# Patient Record
Sex: Female | Born: 1980 | Race: Black or African American | Hispanic: No | Smoking: Never smoker
Health system: Southern US, Community
[De-identification: ages and names within clinical notes are randomized; demographics above are authoritative.]

## PROBLEM LIST (undated history)

## (undated) DIAGNOSIS — I248 Other forms of acute ischemic heart disease: Secondary | ICD-10-CM

## (undated) DIAGNOSIS — I272 Pulmonary hypertension, unspecified: Secondary | ICD-10-CM

## (undated) DIAGNOSIS — E538 Deficiency of other specified B group vitamins: Secondary | ICD-10-CM

## (undated) DIAGNOSIS — D571 Sickle-cell disease without crisis: Secondary | ICD-10-CM

## (undated) DIAGNOSIS — Z5189 Encounter for other specified aftercare: Secondary | ICD-10-CM

## (undated) DIAGNOSIS — J189 Pneumonia, unspecified organism: Secondary | ICD-10-CM

## (undated) DIAGNOSIS — IMO0001 Reserved for inherently not codable concepts without codable children: Secondary | ICD-10-CM

## (undated) DIAGNOSIS — I517 Cardiomegaly: Secondary | ICD-10-CM

## (undated) DIAGNOSIS — F329 Major depressive disorder, single episode, unspecified: Secondary | ICD-10-CM

## (undated) HISTORY — PX: OTHER SURGICAL HISTORY: SHX169

## (undated) HISTORY — PX: PORTACATH PLACEMENT: SHX2246

## (undated) HISTORY — PX: CHOLECYSTECTOMY: SHX55

## (undated) SURGERY — ECHOCARDIOGRAM, TRANSESOPHAGEAL
Anesthesia: Moderate Sedation

---

## 2009-12-18 ENCOUNTER — Ambulatory Visit: Payer: Self-pay | Admitting: Cardiology

## 2010-08-10 ENCOUNTER — Emergency Department (HOSPITAL_COMMUNITY): Admission: EM | Admit: 2010-08-10 | Discharge: 2010-08-10 | Payer: Self-pay | Admitting: Emergency Medicine

## 2010-08-20 ENCOUNTER — Inpatient Hospital Stay (HOSPITAL_COMMUNITY)
Admission: EM | Admit: 2010-08-20 | Discharge: 2010-08-26 | Payer: Self-pay | Source: Home / Self Care | Admitting: Emergency Medicine

## 2010-11-09 ENCOUNTER — Inpatient Hospital Stay (HOSPITAL_COMMUNITY)
Admission: EM | Admit: 2010-11-09 | Discharge: 2010-11-14 | DRG: 812 | Disposition: A | Discharge status: Home / Self Care | Payer: Medicare Other | Attending: Internal Medicine | Admitting: Internal Medicine

## 2010-11-09 ENCOUNTER — Emergency Department (HOSPITAL_COMMUNITY): Payer: Medicare Other

## 2010-11-09 DIAGNOSIS — R0902 Hypoxemia: Secondary | ICD-10-CM | POA: Diagnosis present

## 2010-11-09 DIAGNOSIS — D5700 Hb-SS disease with crisis, unspecified: Principal | ICD-10-CM | POA: Diagnosis present

## 2010-11-09 DIAGNOSIS — D5701 Hb-SS disease with acute chest syndrome: Secondary | ICD-10-CM | POA: Diagnosis present

## 2010-11-09 DIAGNOSIS — J069 Acute upper respiratory infection, unspecified: Secondary | ICD-10-CM | POA: Diagnosis present

## 2010-11-09 LAB — DIFFERENTIAL
Basophils Absolute: 0 10*3/uL (ref 0.0–0.1)
Basophils Relative: 0 % (ref 0–1)
Eosinophils Absolute: 0 10*3/uL (ref 0.0–0.7)
Eosinophils Relative: 0 % (ref 0–5)
Lymphocytes Relative: 23 % (ref 12–46)
Lymphs Abs: 3.2 10*3/uL (ref 0.7–4.0)
Monocytes Absolute: 0 10*3/uL — ABNORMAL LOW (ref 0.1–1.0)
Monocytes Relative: 0 % — ABNORMAL LOW (ref 3–12)
Neutro Abs: 10.9 10*3/uL — ABNORMAL HIGH (ref 1.7–7.7)
Neutrophils Relative %: 75 % (ref 43–77)
nRBC: 0 /100 WBC

## 2010-11-09 LAB — BASIC METABOLIC PANEL
CO2: 24 mEq/L (ref 19–32)
Chloride: 105 mEq/L (ref 96–112)
GFR calc Af Amer: >60 mL/min (ref >60)
Potassium: 3.7 mEq/L (ref 3.5–5.1)

## 2010-11-09 LAB — CBC
Hemoglobin: 7.9 g/dL — ABNORMAL LOW (ref 12.0–15.0)
MCH: 35.4 pg — ABNORMAL HIGH (ref 26.0–34.0)
MCHC: 38.2 g/dL — ABNORMAL HIGH (ref 30.0–36.0)
RDW: 25.5 % — ABNORMAL HIGH (ref 11.5–15.5)

## 2010-11-09 LAB — RETICULOCYTES: Retic Ct Pct: 28.5 % — ABNORMAL HIGH (ref 0.4–3.1)

## 2010-11-10 LAB — COMPREHENSIVE METABOLIC PANEL
ALT: 25 U/L (ref 0–35)
AST: 60 U/L — ABNORMAL HIGH (ref 0–37)
CO2: 23 mEq/L (ref 19–32)
Calcium: 8.3 mg/dL — ABNORMAL LOW (ref 8.4–10.5)
GFR calc Af Amer: >60 mL/min (ref >60)
Potassium: 3.8 mEq/L (ref 3.5–5.1)
Sodium: 139 mEq/L (ref 135–145)
Total Protein: 6.6 g/dL (ref 6.0–8.3)

## 2010-11-10 LAB — CBC
HCT: 19.7 % — ABNORMAL LOW (ref 36.0–46.0)
MCH: 34.9 pg — ABNORMAL HIGH (ref 26.0–34.0)
MCHC: 37.1 g/dL — ABNORMAL HIGH (ref 30.0–36.0)
MCV: 94.3 fL (ref 78.0–100.0)
RDW: 24 % — ABNORMAL HIGH (ref 11.5–15.5)

## 2010-11-10 LAB — URINALYSIS, ROUTINE W REFLEX MICROSCOPIC
Bilirubin Urine: NEGATIVE
Urine Glucose, Fasting: NEGATIVE mg/dL

## 2010-11-10 LAB — DIFFERENTIAL
Eosinophils Absolute: 0.1 10*3/uL (ref 0.0–0.7)
Eosinophils Relative: 1 % (ref 0–5)
Lymphs Abs: 3.2 10*3/uL (ref 0.7–4.0)
Monocytes Relative: 13 % — ABNORMAL HIGH (ref 3–12)

## 2010-11-10 LAB — URINE MICROSCOPIC-ADD ON

## 2010-11-11 LAB — COMPREHENSIVE METABOLIC PANEL
Albumin: 2.9 g/dL — ABNORMAL LOW (ref 3.5–5.2)
Alkaline Phosphatase: 113 U/L (ref 39–117)
BUN: 2 mg/dL — ABNORMAL LOW (ref 6–23)
Chloride: 106 mEq/L (ref 96–112)
Glucose, Bld: 86 mg/dL (ref 70–99)
Potassium: 4 mEq/L (ref 3.5–5.1)
Total Bilirubin: 4.4 mg/dL — ABNORMAL HIGH (ref 0.3–1.2)

## 2010-11-11 LAB — CBC
MCH: 34.8 pg — ABNORMAL HIGH (ref 26.0–34.0)
MCHC: 37.2 g/dL — ABNORMAL HIGH (ref 30.0–36.0)
Platelets: 177 10*3/uL (ref 150–400)
RBC: 2.01 MIL/uL — ABNORMAL LOW (ref 3.87–5.11)
RDW: 22.1 % — ABNORMAL HIGH (ref 11.5–15.5)

## 2010-11-11 LAB — DIFFERENTIAL
Basophils Relative: 1 % (ref 0–1)
Eosinophils Absolute: 0.2 10*3/uL (ref 0.0–0.7)
Monocytes Absolute: 1.7 10*3/uL — ABNORMAL HIGH (ref 0.1–1.0)
Neutrophils Relative %: 47 % (ref 43–77)

## 2010-11-11 LAB — RETICULOCYTES: Retic Ct Pct: 24.4 % — ABNORMAL HIGH (ref 0.4–3.1)

## 2010-11-12 LAB — DIFFERENTIAL
Basophils Absolute: 0.1 10*3/uL (ref 0.0–0.1)
Basophils Relative: 1 % (ref 0–1)
Eosinophils Absolute: 0.3 10*3/uL (ref 0.0–0.7)
Eosinophils Relative: 2 % (ref 0–5)
Lymphocytes Relative: 35 % (ref 12–46)
Lymphs Abs: 4.5 10*3/uL — ABNORMAL HIGH (ref 0.7–4.0)
Monocytes Absolute: 1.9 10*3/uL — ABNORMAL HIGH (ref 0.1–1.0)
Monocytes Relative: 15 % — ABNORMAL HIGH (ref 3–12)
Neutro Abs: 6 10*3/uL (ref 1.7–7.7)
Neutrophils Relative %: 47 % (ref 43–77)

## 2010-11-12 LAB — CBC
HCT: 18.7 % — ABNORMAL LOW (ref 36.0–46.0)
Hemoglobin: 6.9 g/dL — CL (ref 12.0–15.0)
MCH: 34.2 pg — ABNORMAL HIGH (ref 26.0–34.0)
MCHC: 36.9 g/dL — ABNORMAL HIGH (ref 30.0–36.0)
MCV: 92.6 fL (ref 78.0–100.0)
Platelets: 141 10*3/uL — ABNORMAL LOW (ref 150–400)
RBC: 2.02 MIL/uL — ABNORMAL LOW (ref 3.87–5.11)
RDW: 21.1 % — ABNORMAL HIGH (ref 11.5–15.5)
WBC: 12.8 10*3/uL — ABNORMAL HIGH (ref 4.0–10.5)

## 2010-11-12 LAB — BASIC METABOLIC PANEL
BUN: 2 mg/dL — ABNORMAL LOW (ref 6–23)
CO2: 27 mEq/L (ref 19–32)
Calcium: 8.7 mg/dL (ref 8.4–10.5)
Chloride: 102 mEq/L (ref 96–112)
Creatinine, Ser: 0.53 mg/dL (ref 0.4–1.2)
GFR calc Af Amer: >60 mL/min (ref >60)
GFR calc non Af Amer: >60 mL/min (ref >60)
Glucose, Bld: 88 mg/dL (ref 70–99)
Potassium: 4.1 mEq/L (ref 3.5–5.1)
Sodium: 136 mEq/L (ref 135–145)

## 2010-11-13 LAB — DIFFERENTIAL
Basophils Absolute: 0.1 10*3/uL (ref 0.0–0.1)
Eosinophils Absolute: 0.3 10*3/uL (ref 0.0–0.7)
Lymphocytes Relative: 37 % (ref 12–46)
Lymphs Abs: 4.4 10*3/uL — ABNORMAL HIGH (ref 0.7–4.0)
Neutrophils Relative %: 47 % (ref 43–77)

## 2010-11-13 LAB — CBC
HCT: 18.8 % — ABNORMAL LOW (ref 36.0–46.0)
MCHC: 36.2 g/dL — ABNORMAL HIGH (ref 30.0–36.0)
Platelets: 153 10*3/uL (ref 150–400)
RDW: 20.9 % — ABNORMAL HIGH (ref 11.5–15.5)
WBC: 12 10*3/uL — ABNORMAL HIGH (ref 4.0–10.5)

## 2010-11-14 ENCOUNTER — Inpatient Hospital Stay (HOSPITAL_COMMUNITY): Payer: Medicare Other

## 2010-11-14 LAB — DIFFERENTIAL
Basophils Absolute: 0.1 10*3/uL (ref 0.0–0.1)
Basophils Relative: 1 % (ref 0–1)
Lymphocytes Relative: 31 % (ref 12–46)
Monocytes Absolute: 1.7 10*3/uL — ABNORMAL HIGH (ref 0.1–1.0)
Neutro Abs: 6.2 10*3/uL (ref 1.7–7.7)
Neutrophils Relative %: 53 % (ref 43–77)

## 2010-11-14 LAB — COMPREHENSIVE METABOLIC PANEL
Alkaline Phosphatase: 127 U/L — ABNORMAL HIGH (ref 39–117)
BUN: 2 mg/dL — ABNORMAL LOW (ref 6–23)
CO2: 26 mEq/L (ref 19–32)
Calcium: 8.8 mg/dL (ref 8.4–10.5)
GFR calc non Af Amer: >60 mL/min (ref >60)
Glucose, Bld: 88 mg/dL (ref 70–99)
Total Protein: 7.1 g/dL (ref 6.0–8.3)

## 2010-11-14 LAB — RETICULOCYTES
RBC.: 2.57 MIL/uL — ABNORMAL LOW (ref 3.87–5.11)
Retic Count, Absolute: 449.8 10*3/uL — ABNORMAL HIGH (ref 19.0–186.0)

## 2010-11-14 LAB — CBC
Platelets: 158 10*3/uL (ref 150–400)
RDW: 21.9 % — ABNORMAL HIGH (ref 11.5–15.5)
WBC: 11.9 10*3/uL — ABNORMAL HIGH (ref 4.0–10.5)

## 2010-11-14 MED ORDER — IOHEXOL 350 MG/ML SOLN
100.0000 mL | Freq: Once | INTRAVENOUS | Status: AC | PRN
  Administered 2010-11-14: 100 mL via INTRAVENOUS

## 2010-11-15 LAB — CROSSMATCH
ABO/RH(D): A POS
Antibody Screen: POSITIVE
DAT, IgG: NEGATIVE
Unit division: 0
Unit division: 0

## 2010-11-17 NOTE — Discharge Summary (Signed)
  NAME:  Dawn [name], Dawn [name]                    ACCOUNT NO.:  [id number]  MEDICAL RECORD NO.:  [id number]           PATIENT TYPE:  I  LOCATION:  A318                          FACILITY:  [facility]  PHYSICIAN:  Wilson Singer, M.D.DATE OF BIRTH:  Jul 07, 1981  DATE OF ADMISSION:  11/09/2010 DATE OF DISCHARGE:  02/21/2012LH                              DISCHARGE SUMMARY   FINAL DISCHARGE DIAGNOSES: 1. Sickle cell crisis with chest and arm pain. 2. History of sickle cell disease. 3. Upper respiratory tract infection.  CONDITION ON DISCHARGE:  Stable.  MEDICATIONS ON DISCHARGE: 1. Hydroxyurea 500 mg daily. 2. Oxycodone 5 mg every 4 hours p.r.n. 3. Folic acid 1 mg daily. 4. Ibuprofen 200 mg 1 tablet every 6 hours p.r.n. 5. Cozaar 25 mg daily. 6. Fentanyl patch 25 mcg every 72 hours. 7. Levaquin 250 mg daily for five further days.  HISTORY:  This very pleasant 30 year old lady was admitted with a history of chest, arm, and leg pain.  Please see initial history and physical examination done by Dr. Osvaldo Shipper.  HOSPITAL PROGRESS:  The patient was started on intravenous fluids and intravenous opioids as well as oxygen for her sickle cell crisis.  She, over the next few days, improved.  She had a drop in her hemoglobin to a low point of 6.8.  She received a unit of blood transfusion, which improved matters with a hemoglobin of 8.6.  She did have some hypoxemia. There was concern over whether there was a lung infarct or a pulmonary embolism.  Her D-dimer was elevated to 3.99, and a CT angiogram of the chest was done today.  This showed that there was no evidence of pulmonary emboli and no other acute significant findings, although there was mild bibasilar subsegmental atelectasis, which I suppose could have accounted for some of the hypoxia.  Overall, she feels well now and is able to tolerate oral opioids.  She has her sickle cell managed by Dr. Sharen Counter at [facility] in the sickle  cell clinic, and she will follow up with Dr. James Ivanoff as soon as possible within a week or so.  DISPOSITION:  The patient is now stable for discharge with the above medications.     Wilson Singer, M.D.     NCG/MEDQ  D:  11/14/2010  T:  11/15/2010  Job:  [id number]  Electronically Signed by Lilly Cove M.D. on 11/17/2010 02:27:18 PM

## 2010-12-05 LAB — CBC
HCT: 16.7 % — ABNORMAL LOW (ref 36.0–46.0)
HCT: 26.8 % — ABNORMAL LOW (ref 36.0–46.0)
Hemoglobin: 6.6 g/dL — CL (ref 12.0–15.0)
MCH: 36.1 pg — ABNORMAL HIGH (ref 26.0–34.0)
MCH: 37.3 pg — ABNORMAL HIGH (ref 26.0–34.0)
MCH: 37.9 pg — ABNORMAL HIGH (ref 26.0–34.0)
MCHC: 37.7 g/dL — ABNORMAL HIGH (ref 30.0–36.0)
MCHC: 38.2 g/dL — ABNORMAL HIGH (ref 30.0–36.0)
MCV: 97.9 fL (ref 78.0–100.0)
MCV: 99.4 fL (ref 78.0–100.0)
MCV: 97.7 fL (ref 78.0–100.0)
MCV: 98.9 fL (ref 78.0–100.0)
Platelets: 144 10*3/uL — ABNORMAL LOW (ref 150–400)
Platelets: 178 10*3/uL (ref 150–400)
Platelets: 186 10*3/uL (ref 150–400)
Platelets: 132 10*3/uL — ABNORMAL LOW (ref 150–400)
Platelets: 173 10*3/uL (ref 150–400)
Platelets: 185 10*3/uL (ref 150–400)
RBC: 1.68 MIL/uL — ABNORMAL LOW (ref 3.87–5.11)
RBC: 2.85 MIL/uL — ABNORMAL LOW (ref 3.87–5.11)
RBC: 1.74 MIL/uL — ABNORMAL LOW (ref 3.87–5.11)
RBC: 1.77 MIL/uL — ABNORMAL LOW (ref 3.87–5.11)
RBC: 2.2 MIL/uL — ABNORMAL LOW (ref 3.87–5.11)
RDW: 20.9 % — ABNORMAL HIGH (ref 11.5–15.5)
RDW: 21.7 % — ABNORMAL HIGH (ref 11.5–15.5)
RDW: 20.1 % — ABNORMAL HIGH (ref 11.5–15.5)
RDW: 20.3 % — ABNORMAL HIGH (ref 11.5–15.5)
WBC: 6.5 10*3/uL (ref 4.0–10.5)
WBC: 6.8 10*3/uL (ref 4.0–10.5)
WBC: 9.2 10*3/uL (ref 4.0–10.5)
WBC: 12 10*3/uL — ABNORMAL HIGH (ref 4.0–10.5)
WBC: 14.9 10*3/uL — ABNORMAL HIGH (ref 4.0–10.5)
WBC: 9.3 10*3/uL (ref 4.0–10.5)

## 2010-12-05 LAB — DIFFERENTIAL
Basophils Absolute: 0.1 10*3/uL (ref 0.0–0.1)
Basophils Absolute: 0.1 10*3/uL (ref 0.0–0.1)
Basophils Absolute: 0.1 10*3/uL (ref 0.0–0.1)
Basophils Absolute: 0 10*3/uL (ref 0.0–0.1)
Basophils Absolute: 0.1 10*3/uL (ref 0.0–0.1)
Basophils Relative: 1 % (ref 0–1)
Basophils Relative: 1 % (ref 0–1)
Basophils Relative: 0 % (ref 0–1)
Basophils Relative: 1 % (ref 0–1)
Basophils Relative: 1 % (ref 0–1)
Eosinophils Absolute: 0.2 10*3/uL (ref 0.0–0.7)
Eosinophils Absolute: 0.1 10*3/uL (ref 0.0–0.7)
Eosinophils Absolute: 0.2 10*3/uL (ref 0.0–0.7)
Eosinophils Absolute: 0.2 10*3/uL (ref 0.0–0.7)
Eosinophils Relative: 2 % (ref 0–5)
Eosinophils Relative: 0 % (ref 0–5)
Eosinophils Relative: 2 % (ref 0–5)
Eosinophils Relative: 2 % (ref 0–5)
Lymphocytes Relative: 36 % (ref 12–46)
Lymphocytes Relative: 21 % (ref 12–46)
Lymphocytes Relative: 26 % (ref 12–46)
Lymphs Abs: 1.8 10*3/uL (ref 0.7–4.0)
Lymphs Abs: 2.3 10*3/uL (ref 0.7–4.0)
Lymphs Abs: 2.5 10*3/uL (ref 0.7–4.0)
Monocytes Absolute: 0.9 10*3/uL (ref 0.1–1.0)
Monocytes Absolute: 1 10*3/uL (ref 0.1–1.0)
Monocytes Absolute: 1.3 10*3/uL — ABNORMAL HIGH (ref 0.1–1.0)
Monocytes Absolute: 1.3 10*3/uL — ABNORMAL HIGH (ref 0.1–1.0)
Monocytes Absolute: 1.5 10*3/uL — ABNORMAL HIGH (ref 0.1–1.0)
Monocytes Relative: 14 % — ABNORMAL HIGH (ref 3–12)
Monocytes Relative: 15 % — ABNORMAL HIGH (ref 3–12)
Monocytes Relative: 10 % (ref 3–12)
Monocytes Relative: 10 % (ref 3–12)
Monocytes Relative: 14 % — ABNORMAL HIGH (ref 3–12)
Neutro Abs: 3 10*3/uL (ref 1.7–7.7)
Neutro Abs: 4.1 10*3/uL (ref 1.7–7.7)
Neutrophils Relative %: 55 % (ref 43–77)
Neutrophils Relative %: 52 % (ref 43–77)
Neutrophils Relative %: 69 % (ref 43–77)

## 2010-12-05 LAB — URINALYSIS, ROUTINE W REFLEX MICROSCOPIC
Bilirubin Urine: NEGATIVE
Ketones, ur: NEGATIVE mg/dL
Ketones, ur: NEGATIVE mg/dL
Nitrite: NEGATIVE
Nitrite: NEGATIVE
Protein, ur: 100 mg/dL — AB
Protein, ur: NEGATIVE mg/dL
Urobilinogen, UA: 0.2 mg/dL (ref 0.0–1.0)
pH: 6 (ref 5.0–8.0)

## 2010-12-05 LAB — CROSSMATCH
ABO/RH(D): A POS
DAT, IgG: NEGATIVE
Unit division: 0
Unit division: 0

## 2010-12-05 LAB — BASIC METABOLIC PANEL
BUN: 3 mg/dL — ABNORMAL LOW (ref 6–23)
BUN: 4 mg/dL — ABNORMAL LOW (ref 6–23)
CO2: 28 mEq/L (ref 19–32)
Calcium: 8.7 mg/dL (ref 8.4–10.5)
Calcium: 8.7 mg/dL (ref 8.4–10.5)
Chloride: 105 mEq/L (ref 96–112)
Creatinine, Ser: 0.49 mg/dL (ref 0.4–1.2)
Creatinine, Ser: 0.5 mg/dL (ref 0.4–1.2)
Creatinine, Ser: 0.56 mg/dL (ref 0.4–1.2)
GFR calc Af Amer: >60 mL/min (ref >60)
GFR calc non Af Amer: >60 mL/min (ref >60)
GFR calc non Af Amer: >60 mL/min (ref >60)
Glucose, Bld: 90 mg/dL (ref 70–99)
Glucose, Bld: 104 mg/dL — ABNORMAL HIGH (ref 70–99)

## 2010-12-05 LAB — COMPREHENSIVE METABOLIC PANEL
ALT: 30 U/L (ref 0–35)
ALT: 34 U/L (ref 0–35)
AST: 59 U/L — ABNORMAL HIGH (ref 0–37)
Albumin: 3 g/dL — ABNORMAL LOW (ref 3.5–5.2)
Albumin: 3.3 g/dL — ABNORMAL LOW (ref 3.5–5.2)
Alkaline Phosphatase: 105 U/L (ref 39–117)
Alkaline Phosphatase: 106 U/L (ref 39–117)
Alkaline Phosphatase: 112 U/L (ref 39–117)
BUN: 2 mg/dL — ABNORMAL LOW (ref 6–23)
CO2: 26 mEq/L (ref 19–32)
Chloride: 102 mEq/L (ref 96–112)
Chloride: 107 mEq/L (ref 96–112)
Creatinine, Ser: 0.51 mg/dL (ref 0.4–1.2)
GFR calc Af Amer: >60 mL/min (ref >60)
GFR calc Af Amer: >60 mL/min (ref >60)
GFR calc non Af Amer: >60 mL/min (ref >60)
Potassium: 3.1 mEq/L — ABNORMAL LOW (ref 3.5–5.1)
Potassium: 3.6 mEq/L (ref 3.5–5.1)
Potassium: 3.6 mEq/L (ref 3.5–5.1)
Sodium: 136 mEq/L (ref 135–145)
Sodium: 139 mEq/L (ref 135–145)
Total Bilirubin: 4.3 mg/dL — ABNORMAL HIGH (ref 0.3–1.2)
Total Bilirubin: 5.7 mg/dL — ABNORMAL HIGH (ref 0.3–1.2)
Total Protein: 6.9 g/dL (ref 6.0–8.3)
Total Protein: 7.1 g/dL (ref 6.0–8.3)

## 2010-12-05 LAB — CULTURE, BLOOD (ROUTINE X 2)

## 2010-12-05 LAB — RETICULOCYTES
RBC.: 1.99 MIL/uL — ABNORMAL LOW (ref 3.87–5.11)
RBC.: 2.2 MIL/uL — ABNORMAL LOW (ref 3.87–5.11)
Retic Count, Absolute: 437.8 10*3/uL — ABNORMAL HIGH (ref 19.0–186.0)
Retic Ct Pct: 19.9 % — ABNORMAL HIGH (ref 0.4–3.1)

## 2010-12-05 LAB — URINE CULTURE

## 2010-12-05 LAB — FERRITIN: Ferritin: 1618 ng/mL — ABNORMAL HIGH (ref 10–291)

## 2010-12-05 LAB — TROPONIN I: Troponin I: 0.01 ng/mL (ref 0.00–0.06)

## 2010-12-05 LAB — CK TOTAL AND CKMB (NOT AT ARMC)
CK, MB: 0.4 ng/mL (ref 0.3–4.0)
Total CK: 21 U/L (ref 7–177)

## 2010-12-05 LAB — URINE MICROSCOPIC-ADD ON

## 2010-12-10 NOTE — H&P (Signed)
NAME:  Dawn [name], Dawn [name]                    ACCOUNT NO.:  [id number]  MEDICAL RECORD NO.:  [id number]           PATIENT TYPE:  E  LOCATION:  APED                          FACILITY:  [facility]  PHYSICIAN:  Osvaldo Shipper, MD     DATE OF BIRTH:  1981/02/22  DATE OF ADMISSION:  11/09/2010 DATE OF DISCHARGE:  LH                             HISTORY & PHYSICAL   PRIMARY PROVIDER:  Dr. Sharen Counter in [facility] who is a hematologist.  She works in the [employer].  The patient does not have a primary care physician in this community, although she is on the look for one.  ADMISSION DIAGNOSES: 1. Sickle cell crisis with chest pain and arm pain, possible acute     chest syndrome. 2. Low grade fever, etiology unclear. 3. History of sickle cell disease, SS disease. 4. Upper respiratory tract infection.  CHIEF COMPLAINT:  Chest pain, arm pain, leg pain since 1:00 p.m. today.  HISTORY OF PRESENT ILLNESS:  The patient is a 30 year old [name] who has a history of sickle cell disease who was last admitted to our hospital back in November.  She has a history of SS disease.  She tells me that 1 week ago she was having symptoms of upper respiratory tract infection with cold and cough and was seen by physician and was just asked to take some cold and sinus medications. No antibiotics were provided.  Denies any wheezing, but has been having some shortness of breath.  The cough is dry and then yesterday at about 1:00 p.m. she started having chest pain over her sternum.  She also started having bilateral arm pain.  The pain was 8/10 in intensity.  She tried taking the oxycodone that she has at home with no relief and so decided to come into the hospital for further evaluation.  Denies any dysuria.  No hematuria.  No bleeding anywhere.  No nausea or vomiting. The pain is increased by movement and walking and decreases by lying still.  The pain is aching type of  pain.  MEDICATIONS AT HOME: 1. Hydroxyurea 500 mg daily. 2. Folic acid 1 mg daily. 3. Oxycodone 5 mg as needed. 4. Cozaar, unknown dose daily. 5. Fentanyl patch 25 mcg every 3 days, although she has not worn any     since Sunday.  She is awaiting a prescription from her doctor in     [place].  ALLERGIES:  No known drug allergies.  PAST MEDICAL HISTORY:  Positive for; 1. Sickle cell SS disease. 2. She has a history of cholecystectomy, 2 C-sections.  She has had     ACL repair in the left knee.  She has had a Port-A-Cath placement     and removal because of infection in the past.  She has had     plasmapheresis and has had multiple transfusions including 2 units     transfused back in November when she was admitted here in our     hospital.  SOCIAL HISTORY:  Lives in [place] with the family.  She is currently unemployed.  She is on disability.  Denies smoking, alcohol or illicit drug use.  FAMILY HISTORY:  Positive for diabetes, hypertension, heart disease and sickle cell trait.  REVIEW OF SYSTEMS:  GENERAL:  Positive for weakness, malaise.  HEENT: As in HPI.  CARDIOVASCULAR:  Unremarkable except as in HPI.  GI: Unremarkable.  RESPIRATORY:  As in HPI.  GU:  Unremarkable.  NEUROLOGIC: Unremarkable.  PSYCHIATRIC:  Unremarkable.  DERMATOLOGIC:  Unremarkable. MUSCULOSKELETAL:  As in HPI.  Other systems reviewed and found to be negative.  PHYSICAL EXAMINATION:  VITAL SIGNS:  Temperature 100.1, blood pressure 123/67, saturations 95% on oxygen, it was 87% on room air, heart rate was initially 122, respiratory rate is 18. GENERAL:  This is a thin [name] in no distress. HEENT:  Head is normocephalic, atraumatic.  Pupils are equal and reacting.  No pallor.  She does have icterus present in her eyes.  Oral mucous membranes moist.  No oral lesions are noted. NECK:  Soft and supple.  No thyromegaly is appreciated. LUNGS:  No wheezes, rales or rhonchi. CARDIOVASCULAR:   S1 and S2 is tachycardic, regular.  No S3, S4, rubs, murmurs or bruit. ABDOMEN:  Soft, nontender, nondistended.  Bowel sounds are present.  No masses or organomegaly is appreciated. GU:  Deferred. MUSCULOSKELETAL:  She does have tenderness over the sternum over her arms and her legs.  No obvious deformity is noted.  NEUROLOGIC:  She is alert, oriented x3.  No focal neurological deficits are present.  No rashes are present.  LABORATORY DATA:  Her white cell count is 14.1, no differential available.  Hemoglobin is 7.9, MCV is 92, platelet count is 249, absolute retic count is 635 with 28%.  Sodium is 134.  Other electrolytes are normal.  LFTs have not been done.  Chest x-ray shows no acute cardiopulmonary process.  She had an EKG done which shows a sinus tach at 118, normal axis, intervals appear to be in the normal range.  No Q-waves.  No concerning ST or T-wave changes are noted.  ASSESSMENT:  This is a 30 year old [name] with sickle cell disease comes in with crisis.  It appears that it was precipitated by an upper respiratory tract infection which was diagnosed last week. She has a low grade fever.  She was mildly hypoxic when she presented to the hospital. 1. Sickle cell crises with possibly acute chest syndrome.  Because of     her low grade fever and her upper respiratory infection, we will     put her on ceftriaxone and azithromycin for now.  We will give her     IV fluids, pain control will be provided for crisis. 2. Icterus.  We will check LFTs and bilirubin.  Icterus is probably     from hemolysis. 3. Continue with hydroxyurea, folic acid.  We will resume her fentanyl     patch which may provide pain control.  We will monitor her     hemoglobins on a daily basis.  She may require transfusions if it     drops significantly low.  She will be monitored on telemetry.  At     this point, the patient is hemodynamically stable with oxygen will     be provided  for her hypoxia.  DVT prophylaxis will be provided.  Further management decisions will depend on results of further testing and patient's response to treatment.  Please provide the patient with phone numbers of primary care physicians in this community as  she is interested in getting one here.     Osvaldo Shipper, MD     GK/MEDQ  D:  11/09/2010  T:  11/09/2010  Job:  [id number]  cc:   Sharen Counter [facility]  Electronically Signed by Osvaldo Shipper MD on 12/10/2010 06:51:47 AM

## 2010-12-12 ENCOUNTER — Emergency Department (HOSPITAL_COMMUNITY): Payer: Medicare Other

## 2010-12-12 ENCOUNTER — Emergency Department (HOSPITAL_COMMUNITY)
Admission: EM | Admit: 2010-12-12 | Discharge: 2010-12-13 | Disposition: A | Discharge status: Still In Hospital | Payer: Medicare Other | Attending: Emergency Medicine | Admitting: Emergency Medicine

## 2010-12-12 DIAGNOSIS — D638 Anemia in other chronic diseases classified elsewhere: Secondary | ICD-10-CM | POA: Insufficient documentation

## 2010-12-12 DIAGNOSIS — D5700 Hb-SS disease with crisis, unspecified: Secondary | ICD-10-CM | POA: Insufficient documentation

## 2010-12-12 DIAGNOSIS — R072 Precordial pain: Secondary | ICD-10-CM | POA: Insufficient documentation

## 2010-12-12 DIAGNOSIS — Z79899 Other long term (current) drug therapy: Secondary | ICD-10-CM | POA: Insufficient documentation

## 2010-12-12 LAB — DIFFERENTIAL
Basophils Relative: 1 % (ref 0–1)
Eosinophils Relative: 0 % (ref 0–5)
Lymphocytes Relative: 15 % (ref 12–46)
Monocytes Relative: 13 % — ABNORMAL HIGH (ref 3–12)
Neutro Abs: 10.5 10*3/uL — ABNORMAL HIGH (ref 1.7–7.7)
Neutrophils Relative %: 71 % (ref 43–77)

## 2010-12-12 LAB — URINE MICROSCOPIC-ADD ON

## 2010-12-12 LAB — CBC
HCT: 17.8 % — ABNORMAL LOW (ref 36.0–46.0)
Hemoglobin: 6.3 g/dL — CL (ref 12.0–15.0)
MCH: 35.2 pg — ABNORMAL HIGH (ref 26.0–34.0)
MCHC: 35.4 g/dL (ref 30.0–36.0)
MCV: 99.4 fL (ref 78.0–100.0)
RBC: 1.79 MIL/uL — ABNORMAL LOW (ref 3.87–5.11)

## 2010-12-12 LAB — URINALYSIS, ROUTINE W REFLEX MICROSCOPIC
Nitrite: POSITIVE — AB
Specific Gravity, Urine: 1.025 (ref 1.005–1.030)
Urobilinogen, UA: 1 mg/dL (ref 0.0–1.0)
pH: 5.5 (ref 5.0–8.0)

## 2010-12-12 LAB — BASIC METABOLIC PANEL
Chloride: 104 mEq/L (ref 96–112)
GFR calc non Af Amer: >60 mL/min (ref >60)
Glucose, Bld: 93 mg/dL (ref 70–99)
Potassium: 3.8 mEq/L (ref 3.5–5.1)
Sodium: 137 mEq/L (ref 135–145)

## 2010-12-12 LAB — RETICULOCYTES: Retic Ct Pct: >27 % — ABNORMAL HIGH (ref 0.4–3.1)

## 2010-12-24 NOTE — Consult Note (Signed)
NAME:  Dawn [name], Dawn [name]                    ACCOUNT NO.:  [id number]  MEDICAL RECORD NO.:  [id number]           PATIENT TYPE:  E  LOCATION:  APED                          FACILITY:  [facility]  PHYSICIAN:  Osvaldo Shipper, MD     DATE OF BIRTH:  [birth year]  DATE OF CONSULTATION:  12/12/2010 DATE OF DISCHARGE:                                CONSULTATION   The patient does not have a primary care physician in this area.  She is followed by Dr. James Ivanoff in [facility] in the Sickle Cell Clinic.  Dr. James Ivanoff is the hematologist.  The patient was last admitted here from November 09, 2010, to November 15, 2010, for sickle cell crisis.  CHIEF COMPLAINT:  Chest pain and arm pain.  PERSON REQUESTING CONSULTATION:  Sunnie Nielsen, MD with the ED  REASON FOR CONSULTATION:  Sickle cell crisis.  HISTORY OF PRESENT ILLNESS:  The patient is a 30 year old [name] who has a past medical history of sickle cell disease. She has SS disease and is followed by Dr. James Ivanoff at [facility].  She has been admitted twice now to our system in the last 4 months.  Usually, her sickle cell crisis is very well controlled according to the patient. So, this is extremely unusual for her to require frequent medical care. In any case, the patient tells me that her pain started on Monday that is yesterday morning, it was located in the chest area as well as the bilateral forearms.  She had some shortness of breath.  She took two oxycodone, took a shower, went to bed with no relief.  She had chills, checked a temperature was 100.5.  She took ibuprofen and came down to 99.  She took more oxycodone with no relief.  She started passing dark urine and then since the symptoms were not improving, she decided to come into the hospital.  The chest pain is sharp pain in the chest 8/10, arm pain is also similar 6/10.  Pain has worsened with movement, and different body positions provide partial relief.  With Dilaudid,  pain level is better, but she is still hurting quite a bit.  Denies any dizziness, lightheadedness.  MEDICATIONS AT HOME: 1. Hydroxyurea.  She alternates 500 mg with 1000 mg on a daily basis. 2. Folic acid 1 mg daily. 3. Cozaar half of 25 mg daily. 4. Oxycodone as needed. 5. Fentanyl patch 25 mcg every 3 days.  ALLERGIES:  She cannot take TYLENOL due to liver problems.  PAST MEDICAL HISTORY:  Positive for sickle cell SS disease.  She has a history of cholecystectomy, C-section, and ACL repair of the left knee. She has had Port-A-Cath placement and removal in the past due to infection.  She has had exchange transfusion in the past as well at [facility].  She was transfused blood in December of last year as well as in February of this year.  SOCIAL HISTORY:  Lives in [place] with her family, currently unemployed.  She is on disability.  She denies smoking, alcohol, or illicit drug use.  FAMILY HISTORY:  Positive for diabetes,  hypertension, heart disease, and sickle cell trait.  REVIEW OF SYSTEMS:  GENERAL:  Positive for weakness, malaise.  HEENT: Unremarkable.  CARDIOVASCULAR:  As in HPI.  RESPIRATORY:  As in HPI. GI:  Unremarkable.  GU:  Unremarkable.  NEUROLOGICAL:  Unremarkable. PSYCHIATRIC:  Unremarkable.  DERMATOLOGIC:  Unremarkable.  Other systems reviewed and found to be negative.  PHYSICAL EXAMINATION:  VITAL SIGNS:  Temperature here was 99.7, blood pressure 115/59, heart rate initially when she came was 119, still hanging of 110, respiratory rate was about 20, saturation was 93% on 2 liters, about 97-98% on 3-4 liters. GENERAL:  This is an overweight [name], in no distress. HEENT:  Head is normocephalic, atraumatic.  Pupils are equal, reacting. No pallor.  Mild icterus is present.  Oral mucous membranes is slightly dry.  No oral lesions are noted. NECK:  Soft and supple.  No thyromegaly is appreciated. LUNGS:  Decreased air entry at the bases.  No  definite crackles present. CARDIOVASCULAR:  S1 and S2, tachycardic, regular.  No S3, S4, rubs, murmurs, or bruits. ABDOMEN:  Soft, nontender, nondistended.  Bowel sounds are present.  No masses or organomegaly is appreciated. GU:  Deferred. MUSCULOSKELETAL:  She has tenderness over the chest area over bilateral forearms. NEUROLOGIC:  She is alert, oriented x3.  No focal neurological deficits are present.  LABORATORY DATA:  Her CBC showed a white count of 14,700 with monocytosis, hemoglobin is 6.3, MCV is 99, platelet count is 128.  Her reticulocyte count greater than 27%.  Electrolytes are normal.  Beta HCG is negative in the urine.  UA shows moderate bilirubin, large blood, proteins, positive nitrite, few squamous epithelial cells, 0-2 wbc's, 3- 6 rbc's, many bacteria.  IMAGING STUDIES:  She had a chest x-ray, which showed a probable left lower lobe atelectasis without any other active cardiopulmonary process.  ASSESSMENT:  This is a 30 year old [name] with sickle cell disease who presents with pain in the chest and arms, has elevated reticulocyte count, so this is the most likely sickle cell crisis with possibility of acute chest syndrome.  She has also had low-grade feverat home.  PLAN:  Sickle cell disease.  It appears that the patient is having more frequent symptomatology.  This concerns me a bit and concerns the patient as well, and so I spoke to Dr. Sharen Counter, and she was also somewhat concerned by this individual's clinical situation in the last few months.  So, at this time because of the possibility of requiring a exchange transfusion and the fact that she is followed at [facility], we felt that the patient will be better served by being transferred to [facility], and so this will be arranged tonight.  In the meantime, we will give her Dilaudid.  We will give her 2 liters of fluid wide open, give a continuity oxygenation.  For her  low-grade fever, leukocytosis, and we have already given her ceftriaxone and azithromycin.  Consideration may be given to broadening the spectrum; however, I will defer this to the [facility] physicians.  So, at this time, I was just informed that the patient has a bed available.  We are awaiting on transportation of the patient to [facility].  We would like to thank Dr. James Ivanoff for her input and to Dr. Rene Paci hospitalist at [facility] for accepting the patient in transfer.  ADDENDUM: PATIENT SPIKED A FEVER TO 101 IN THE ED PRIOR TO TRANSFER AND HENCE AS GIVEN 1G OF VANCOMYCIN.  Osvaldo Shipper, MD     GK/MEDQ  D:  12/12/2010  T:  12/12/2010  Job:  [id number]  cc:   Ruel Favors, MD  Jana Half, MD  Electronically Signed by Osvaldo Shipper MD on 12/24/2010 02:49:57 PM

## 2011-01-02 LAB — RETICULOCYTES: Retic Ct Pct: >20 % — ABNORMAL HIGH (ref 0.4–3.1)

## 2011-02-13 ENCOUNTER — Emergency Department (HOSPITAL_COMMUNITY)
Admission: EM | Admit: 2011-02-13 | Discharge: 2011-02-14 | Disposition: A | Discharge status: Home / Self Care | Payer: Medicare Other | Attending: Emergency Medicine | Admitting: Emergency Medicine

## 2011-02-13 DIAGNOSIS — Z79899 Other long term (current) drug therapy: Secondary | ICD-10-CM | POA: Insufficient documentation

## 2011-02-13 DIAGNOSIS — D5700 Hb-SS disease with crisis, unspecified: Secondary | ICD-10-CM | POA: Insufficient documentation

## 2011-02-14 ENCOUNTER — Emergency Department (HOSPITAL_COMMUNITY): Payer: Medicare Other

## 2011-02-14 LAB — CBC
Hemoglobin: 6.8 g/dL — CL (ref 12.0–15.0)
MCV: 100.6 fL — ABNORMAL HIGH (ref 78.0–100.0)
RBC: 1.81 MIL/uL — ABNORMAL LOW (ref 3.87–5.11)
WBC: 10.6 10*3/uL — ABNORMAL HIGH (ref 4.0–10.5)

## 2011-02-14 LAB — DIFFERENTIAL
Band Neutrophils: 0 % (ref 0–10)
Blasts: 0 %
Eosinophils Absolute: 0.2 10*3/uL (ref 0.0–0.7)
Metamyelocytes Relative: 0 %
Monocytes Absolute: 0.8 10*3/uL (ref 0.1–1.0)
Monocytes Relative: 8 % (ref 3–12)
Myelocytes: 0 %

## 2011-02-14 LAB — RETICULOCYTES
RBC.: 1.76 MIL/uL — ABNORMAL LOW (ref 3.87–5.11)
Retic Count, Absolute: 406.6 10*3/uL — ABNORMAL HIGH (ref 19.0–186.0)
Retic Ct Pct: 23.1 % — ABNORMAL HIGH (ref 0.4–3.1)

## 2011-02-14 LAB — BASIC METABOLIC PANEL
CO2: 27 mEq/L (ref 19–32)
Chloride: 101 mEq/L (ref 96–112)
Glucose, Bld: 170 mg/dL — ABNORMAL HIGH (ref 70–99)
Sodium: 135 mEq/L (ref 135–145)

## 2011-02-14 LAB — HEPATIC FUNCTION PANEL
ALT: 27 U/L (ref 0–35)
AST: 69 U/L — ABNORMAL HIGH (ref 0–37)
Albumin: 3.5 g/dL (ref 3.5–5.2)

## 2011-03-15 ENCOUNTER — Emergency Department (HOSPITAL_COMMUNITY): Payer: Medicare Other

## 2011-03-15 ENCOUNTER — Emergency Department (HOSPITAL_COMMUNITY)
Admission: EM | Admit: 2011-03-15 | Discharge: 2011-03-15 | Disposition: A | Discharge status: Home / Self Care | Payer: Medicare Other | Attending: Emergency Medicine | Admitting: Emergency Medicine

## 2011-03-15 ENCOUNTER — Inpatient Hospital Stay (HOSPITAL_COMMUNITY)
Admission: EM | Admit: 2011-03-15 | Discharge: 2011-03-21 | DRG: 812 | Disposition: A | Discharge status: Home / Self Care | Payer: Medicare Other | Attending: Internal Medicine | Admitting: Internal Medicine

## 2011-03-15 DIAGNOSIS — R5081 Fever presenting with conditions classified elsewhere: Secondary | ICD-10-CM | POA: Diagnosis present

## 2011-03-15 DIAGNOSIS — D5700 Hb-SS disease with crisis, unspecified: Secondary | ICD-10-CM | POA: Insufficient documentation

## 2011-03-15 LAB — URINE MICROSCOPIC-ADD ON

## 2011-03-15 LAB — DIFFERENTIAL
Basophils Relative: 1 % (ref 0–1)
Blasts: 0 %
Eosinophils Absolute: 0.1 10*3/uL (ref 0.0–0.7)
Eosinophils Relative: 1 % (ref 0–5)
Lymphocytes Relative: 50 % — ABNORMAL HIGH (ref 12–46)
Lymphocytes Relative: 15 % (ref 12–46)
Lymphs Abs: 1.8 10*3/uL (ref 0.7–4.0)
Monocytes Absolute: 0.7 10*3/uL (ref 0.1–1.0)
Monocytes Relative: 7 % (ref 3–12)
Monocytes Relative: 6 % (ref 3–12)
Neutrophils Relative %: 41 % — ABNORMAL LOW (ref 43–77)
Neutrophils Relative %: 79 % — ABNORMAL HIGH (ref 43–77)
nRBC: 0 /100 WBC

## 2011-03-15 LAB — CBC
HCT: 21.2 % — ABNORMAL LOW (ref 36.0–46.0)
HCT: 19.6 % — ABNORMAL LOW (ref 36.0–46.0)
Hemoglobin: 7.9 g/dL — ABNORMAL LOW (ref 12.0–15.0)
MCH: 36.2 pg — ABNORMAL HIGH (ref 26.0–34.0)
MCV: 97.2 fL (ref 78.0–100.0)
MCV: 93.8 fL (ref 78.0–100.0)
Platelets: 170 10*3/uL (ref 150–400)
RBC: 2.18 MIL/uL — ABNORMAL LOW (ref 3.87–5.11)
RDW: 23.4 % — ABNORMAL HIGH (ref 11.5–15.5)

## 2011-03-15 LAB — BASIC METABOLIC PANEL
BUN: 7 mg/dL (ref 6–23)
CO2: 25 mEq/L (ref 19–32)
Chloride: 99 mEq/L (ref 96–112)
Chloride: 105 mEq/L (ref 96–112)
Creatinine, Ser: <0.47 mg/dL — ABNORMAL LOW (ref 0.50–1.10)
Creatinine, Ser: <0.47 mg/dL — ABNORMAL LOW (ref 0.50–1.10)

## 2011-03-15 LAB — HEPATIC FUNCTION PANEL
Bilirubin, Direct: 1.1 mg/dL — ABNORMAL HIGH (ref 0.0–0.3)
Indirect Bilirubin: 5.6 mg/dL — ABNORMAL HIGH (ref 0.3–0.9)

## 2011-03-15 LAB — URINALYSIS, ROUTINE W REFLEX MICROSCOPIC
Glucose, UA: NEGATIVE mg/dL
Glucose, UA: NEGATIVE mg/dL
Ketones, ur: NEGATIVE mg/dL
Leukocytes, UA: NEGATIVE
Leukocytes, UA: NEGATIVE
Protein, ur: >300 mg/dL — AB
Specific Gravity, Urine: 1.025 (ref 1.005–1.030)
pH: 6 (ref 5.0–8.0)

## 2011-03-15 LAB — RETICULOCYTES: Retic Ct Pct: 32.9 % — ABNORMAL HIGH (ref 0.4–3.1)

## 2011-03-16 ENCOUNTER — Inpatient Hospital Stay (HOSPITAL_COMMUNITY): Payer: Medicare Other

## 2011-03-16 LAB — BASIC METABOLIC PANEL
BUN: 7 mg/dL (ref 6–23)
Calcium: 8.7 mg/dL (ref 8.4–10.5)
Glucose, Bld: 108 mg/dL — ABNORMAL HIGH (ref 70–99)
Potassium: 3.8 mEq/L (ref 3.5–5.1)
Sodium: 137 mEq/L (ref 135–145)

## 2011-03-16 LAB — DIFFERENTIAL
Basophils Absolute: 0.1 10*3/uL (ref 0.0–0.1)
Eosinophils Absolute: 0 10*3/uL (ref 0.0–0.7)
Eosinophils Relative: 0 % (ref 0–5)
Lymphocytes Relative: 16 % (ref 12–46)
Monocytes Absolute: 1.3 10*3/uL — ABNORMAL HIGH (ref 0.1–1.0)

## 2011-03-16 LAB — CBC
HCT: 18.1 % — ABNORMAL LOW (ref 36.0–46.0)
Hemoglobin: 6.9 g/dL — CL (ref 12.0–15.0)
MCH: 35.4 pg — ABNORMAL HIGH (ref 26.0–34.0)
MCHC: 38.1 g/dL — ABNORMAL HIGH (ref 30.0–36.0)
RDW: 23.3 % — ABNORMAL HIGH (ref 11.5–15.5)

## 2011-03-17 LAB — DIFFERENTIAL
Basophils Absolute: 0.1 10*3/uL (ref 0.0–0.1)
Eosinophils Absolute: 0.1 10*3/uL (ref 0.0–0.7)
Lymphocytes Relative: 22 % (ref 12–46)
Monocytes Relative: 11 % (ref 3–12)
Neutrophils Relative %: 65 % (ref 43–77)

## 2011-03-17 LAB — CBC
Hemoglobin: 6.3 g/dL — CL (ref 12.0–15.0)
MCH: 35 pg — ABNORMAL HIGH (ref 26.0–34.0)
Platelets: 140 10*3/uL — ABNORMAL LOW (ref 150–400)
RBC: 1.8 MIL/uL — ABNORMAL LOW (ref 3.87–5.11)
WBC: 9.6 10*3/uL (ref 4.0–10.5)

## 2011-03-17 LAB — BASIC METABOLIC PANEL
CO2: 29 mEq/L (ref 19–32)
Calcium: 8.3 mg/dL — ABNORMAL LOW (ref 8.4–10.5)
Potassium: 3.6 mEq/L (ref 3.5–5.1)
Sodium: 138 mEq/L (ref 135–145)

## 2011-03-17 LAB — RETICULOCYTES
RBC.: 1.8 MIL/uL — ABNORMAL LOW (ref 3.87–5.11)
Retic Count, Absolute: 689.4 10*3/uL — ABNORMAL HIGH (ref 19.0–186.0)
Retic Ct Pct: 38.3 % — ABNORMAL HIGH (ref 0.4–3.1)

## 2011-03-17 LAB — HEPATIC FUNCTION PANEL
Albumin: 2.7 g/dL — ABNORMAL LOW (ref 3.5–5.2)
Alkaline Phosphatase: 154 U/L — ABNORMAL HIGH (ref 39–117)
Indirect Bilirubin: 5.3 mg/dL — ABNORMAL HIGH (ref 0.3–0.9)
Total Protein: 6.3 g/dL (ref 6.0–8.3)

## 2011-03-17 NOTE — Group Therapy Note (Signed)
  NAME:  Dawn [name], Dawn [name]                    ACCOUNT NO.:  [id number]  MEDICAL RECORD NO.:  [id number]  LOCATION:  A326                          FACILITY:  [facility]  PHYSICIAN:  Wilson Singer, M.D.DATE OF BIRTH:  13-Apr-1981  DATE OF PROCEDURE:  03/17/2011 DATE OF DISCHARGE:                                PROGRESS NOTE   This lady has been admitted with sickle cell crisis and is currently on intravenous fluids, and PCA pump and her pain is barely tolerable at this point.  She has had multiple admissions lately for this problem.  PHYSICAL EXAMINATION:  VITAL SIGNS:  Temperature 97.8, although she did have a fever of 101.5 last night; blood pressure 105/70, pulse 79, saturation 96% on 2 L oxygen. GENERAL:  She looks uncomfortable at rest, although she looks systemically well and is not septic clinically. HEART:  Heart sounds are present and normal. LUNGS:  Lung fields are clear without any evidence of pleural rub or bronchial breathing.  INVESTIGATIONS:  Sodium 138, potassium 3.6, bicarbonate 29, BUN 6, creatinine less than 0.47.  Total bilirubin is 6.9 with indirect bilirubin being majority of this indicating hemolysis.  Hemoglobin decreased to 6.3, white blood cell count 9.6, platelets 140, reticulocyte count remains high at 38.3% with an absolute reticulocyte count at 689.  IMPRESSION: 1. Sickle cell crisis. 2. Fever.  PLAN: 1. Give 2 units of blood transfusion. 2. Continue intravenous fluids and antibiotics for the time being.     Wilson Singer, M.D.     NCG/MEDQ  D:  03/17/2011  T:  03/17/2011  Job:  [id number]  Electronically Signed by Lilly Cove M.D. on 03/17/2011 11:31:53 AM

## 2011-03-18 LAB — CBC
HCT: 24.8 % — ABNORMAL LOW (ref 36.0–46.0)
MCHC: 36.7 g/dL — ABNORMAL HIGH (ref 30.0–36.0)
MCV: 89.2 fL (ref 78.0–100.0)
RDW: 22.3 % — ABNORMAL HIGH (ref 11.5–15.5)

## 2011-03-18 LAB — COMPREHENSIVE METABOLIC PANEL
AST: 73 U/L — ABNORMAL HIGH (ref 0–37)
BUN: 6 mg/dL (ref 6–23)
CO2: 28 mEq/L (ref 19–32)
Chloride: 105 mEq/L (ref 96–112)
Creatinine, Ser: <0.47 mg/dL — ABNORMAL LOW (ref 0.50–1.10)
Glucose, Bld: 101 mg/dL — ABNORMAL HIGH (ref 70–99)
Total Bilirubin: 6.7 mg/dL — ABNORMAL HIGH (ref 0.3–1.2)

## 2011-03-18 LAB — DIFFERENTIAL
Basophils Relative: 1 % (ref 0–1)
Eosinophils Relative: 1 % (ref 0–5)
Lymphocytes Relative: 7 % — ABNORMAL LOW (ref 12–46)
Neutrophils Relative %: 81 % — ABNORMAL HIGH (ref 43–77)

## 2011-03-18 NOTE — Group Therapy Note (Signed)
  NAME:  Dawn [name], Dawn [name]                    ACCOUNT NO.:  [id number]  MEDICAL RECORD NO.:  [id number]  LOCATION:  A326                          FACILITY:  [facility]  PHYSICIAN:  Wilson Singer, M.D.DATE OF BIRTH:  09-Feb-1981  DATE OF PROCEDURE: DATE OF DISCHARGE:                                PROGRESS NOTE   This lady has improved.  She was eventually able to get 2 units of blood as there had been issues about cross-matching her due to several antibodies.  However, her pain is better.  She feels better.  PHYSICAL EXAMINATION:  Temperature 97.9, blood pressure 105/67, pulse 82, saturation 95% on 2 liters oxygen.  Heart sounds are present and normal.  Lung fields are clear.  INVESTIGATIONS:  Hemoglobin up to 9.1 now, white blood cell count 8.6, platelets 137,000.  Reticulocyte count has decreased to 26% compared to yesterday.  Blood cultures so far are still negative.  IMPRESSION:  Sickle cell vasoocclusive crisis, improving.  PLAN: 1. Discontinue PCA pump and continue IV fluids. 2. Start scheduled oxycodone and also breakthrough oxycodone as     needed.     Wilson Singer, M.D.     NCG/MEDQ  D:  03/18/2011  T:  03/18/2011  Job:  [id number]  Electronically Signed by Lilly Cove M.D. on 03/18/2011 10:44:40 AM

## 2011-03-18 NOTE — H&P (Signed)
NAME:  Dawn [name], Dawn [name]                    ACCOUNT NO.:  [id number]  MEDICAL RECORD NO.:  [id number]  LOCATION:  A322                          FACILITY:  [facility]  PHYSICIAN:  Tarry Kos, MD       DATE OF BIRTH:  May 18, 1981  DATE OF ADMISSION:  03/15/2011 DATE OF DISCHARGE:  LH                             HISTORY & PHYSICAL   CHIEF COMPLAINT:  Pain.  HISTORY OF PRESENT ILLNESS:  Dawn [name] is a 30 year old [name] who has a history of sickle cell disease since age of [age] years old who presents to the emergency department with exacerbation of her sickle cell.  Her pain is usually in her thighs and her back.  She says normally her sickle cell disease is controlled.  She normally sees Dawn [name] at [facility] in the Sickle Cell Clinic, and she was actually just their within the last month, however, over the last couple of days she is having another exacerbation.  She denies any cough, fevers, chest pain, shortness of breath.  Denies any nausea, vomiting, or diarrhea.  Her pain is usually well-controlled with 25 mcg of fentanyl patch every 72 hours and she uses oxycodone rarely.  She does not even use oxycodone for breakthrough pain on a daily basis.  We had requested for her to be transferred at [facility] do not think the patient needs to be transferred and advised for [place] to manage her sickle cell crisis here.  The patient is compliant with her medications and she seems reliable and compliant since she has been in the emergency department, she received IV fluids and IV Dilaudid and she is better.  REVIEW OF SYSTEMS:  Otherwise negative.  PAST MEDICAL HISTORY:  Sickle cell disease since age of 1, SS disease status post chole, status post 2 C-sections.  She has a Port-A-Cath. She has had plasmapheresis and multiple transfusions in the past.  ALLERGIES:  None.  MEDICATIONS: 1. Fentanyl 25 mcg patch every 72 hours. 2. Hydroxyurea 500 mg.  She  alternates this with 1000 mg every other     day. 3. Oxycodone 5 mg every 4 hours as needed. 4. Folic acid 1 mg a day. 5. Cozaar 25 mg a day. 6. Ibuprofen as needed.  SOCIAL HISTORY:  She is a nonsmoker.  No alcohol.  No IV drug abuse.  PHYSICAL EXAMINATION:  VITAL SIGNS:  Temperature is 100.6.  Blood pressure 110/55, pulse 124 initially, respirations 20.  Currently, her temperature is 98.3, O2 sats 96% on room air.  Blood pressure 124/73, pulse 99. GENERAL:  She is alert and oriented x4.  No apparent stress, cooperative and friendly. HEENT:  Extraocular movements are intact.  Pupils are equal and reactive to light.  Oropharynx clear.  Mucous membranes moist. NECK:  No JVD.  No carotid bruits. COR:  Regular rate and rhythm without murmurs, rubs, or gallops. CHEST:  Clear to auscultation bilaterally.  No wheeze, rhonchi, or rales. ABDOMEN:  Soft and nontender.  Nondistended.  Positive bowel sounds.  No hepatosplenomegaly. EXTREMITIES:  No clubbing, cyanosis, or edema. PSYCH:  Normal mood and affect. NEURO:  No focal  neurologic deficits. SKIN:  No rashes.  LABORATORY DATA:  His hemoglobin is 7.3 which is at her baseline, white count is 12.3.  Electrolytes are normal.  BUN and creatinine are normal. Urinalysis large amount of blood, negative for nitrite, negative for leukocytes, and few bacteria.  LFTs alk phos is 164, AST is 99, total bili is 6.7.  Chest x-ray negative.  ASSESSMENT/PLAN: 1. This is a 30 year old female with acute sickle cell crisis with low     grade fever. 2. Sickle cell crisis place on aggressive IV fluids and monitor her     hemoglobin and transfuse if she drops below 6.5 or 6, provide her     IV Dilaudid as needed for pain and we will proceed to PCA pump as     needed. 3. Low grade fever without a clear source of infection.  I am going to     empirically put her on Levaquin, obtain blood cultures and urine     cultures.  Chest x-ray is negative. 4. We  would still consider transferring her to [facility] to see if there is     anything else that can be done for her as she has had multiple     frequent hospitalizations recently in a patient that seem to have     been well controlled prior to the last 4- 5 months  The patient is full code.  Further recommendation pending on overall hospital course.                                           ______________________________ Tarry Kos, MD     RD/MEDQ  D:  03/15/2011  T:  03/16/2011  Job:  [id number]  Electronically Signed by Tarry Kos MD on 03/18/2011 04:52:10 AM

## 2011-03-19 ENCOUNTER — Inpatient Hospital Stay (HOSPITAL_COMMUNITY): Payer: Medicare Other

## 2011-03-19 LAB — COMPREHENSIVE METABOLIC PANEL
Albumin: 2.8 g/dL — ABNORMAL LOW (ref 3.5–5.2)
Alkaline Phosphatase: 181 U/L — ABNORMAL HIGH (ref 39–117)
BUN: 6 mg/dL (ref 6–23)
Potassium: 3.8 mEq/L (ref 3.5–5.1)
Sodium: 137 mEq/L (ref 135–145)
Total Protein: 7.1 g/dL (ref 6.0–8.3)

## 2011-03-19 LAB — CROSSMATCH
ABO/RH(D): A POS
Antibody Screen: POSITIVE

## 2011-03-19 LAB — DIFFERENTIAL
Eosinophils Absolute: 0.1 10*3/uL (ref 0.0–0.7)
Lymphs Abs: 0.9 10*3/uL (ref 0.7–4.0)
Monocytes Relative: 7 % (ref 3–12)
Neutrophils Relative %: 82 % — ABNORMAL HIGH (ref 43–77)

## 2011-03-19 LAB — URINALYSIS, ROUTINE W REFLEX MICROSCOPIC
Glucose, UA: NEGATIVE mg/dL
Specific Gravity, Urine: 1.015 (ref 1.005–1.030)
pH: 7 (ref 5.0–8.0)

## 2011-03-19 LAB — CBC
HCT: 25.2 % — ABNORMAL LOW (ref 36.0–46.0)
Hemoglobin: 9.3 g/dL — ABNORMAL LOW (ref 12.0–15.0)
MCV: 89.4 fL (ref 78.0–100.0)
RBC: 2.82 MIL/uL — ABNORMAL LOW (ref 3.87–5.11)
RDW: 22.2 % — ABNORMAL HIGH (ref 11.5–15.5)
WBC: 9.5 10*3/uL (ref 4.0–10.5)

## 2011-03-19 LAB — URINE MICROSCOPIC-ADD ON

## 2011-03-20 LAB — COMPREHENSIVE METABOLIC PANEL
ALT: 33 U/L (ref 0–35)
AST: 70 U/L — ABNORMAL HIGH (ref 0–37)
Calcium: 9.1 mg/dL (ref 8.4–10.5)
Creatinine, Ser: <0.47 mg/dL — ABNORMAL LOW (ref 0.50–1.10)
Sodium: 136 mEq/L (ref 135–145)
Total Protein: 6.9 g/dL (ref 6.0–8.3)

## 2011-03-20 LAB — DIFFERENTIAL
Eosinophils Relative: 1 % (ref 0–5)
Lymphocytes Relative: 10 % — ABNORMAL LOW (ref 12–46)
Lymphs Abs: 0.6 10*3/uL — ABNORMAL LOW (ref 0.7–4.0)
Monocytes Absolute: 0.6 10*3/uL (ref 0.1–1.0)
Neutro Abs: 4.8 10*3/uL (ref 1.7–7.7)

## 2011-03-20 LAB — CBC
HCT: 23.7 % — ABNORMAL LOW (ref 36.0–46.0)
Hemoglobin: 8.7 g/dL — ABNORMAL LOW (ref 12.0–15.0)
MCHC: 36.7 g/dL — ABNORMAL HIGH (ref 30.0–36.0)
RBC: 2.65 MIL/uL — ABNORMAL LOW (ref 3.87–5.11)

## 2011-03-20 LAB — CULTURE, BLOOD (ROUTINE X 2)

## 2011-03-20 NOTE — Group Therapy Note (Signed)
  NAME:  Jentsch, Emilyann                    ACCOUNT NO.:  [id number]  MEDICAL RECORD NO.:  [id number]  LOCATION:                                 FACILITY:  PHYSICIAN:  Wilson Singer, M.D.DATE OF BIRTH:  May 25, 1981  DATE OF PROCEDURE:  03/19/2011 DATE OF DISCHARGE:                                PROGRESS NOTE   This lady continues to get fevers, most recently the one being at 101.2 this morning.  Blood cultures so far have been negative.  Her pain is somewhat better controlled, but she describes a 7/10 pain level, reducing to 4/10 with oxycodone 5 mg.  PHYSICAL EXAMINATION:  VITAL SIGNS:  Temperature 101.2, blood pressure 106/69, pulse 98, saturation 99% on 2 liters oxygen. CARDIAC:  Heart sounds are present and normal without murmurs or pericardial rub. CHEST:  Lung fields are entirely clear. NEUROLOGIC:  She is alert and oriented without any focal neurologic signs.  INVESTIGATIONS:  Hemoglobin stable at 9.3, white blood cell count 9.5, platelets 185.  Sodium 137, potassium 3.7, bicarbonate 27, BUN 6, creatinine is less than 0.47.  Total bilirubin is down to 5.1, from 6.7 yesterday.  IMPRESSION: 1. Sickle cell crisis. 2. Fever of unclear etiology.  PLAN: 1. Repeat a chest x-ray and obtain urine for urinalysis and culture. 2. We will increase the oxycodone to 7.5 mg every 4 hours and increase     the p.r.n. to every 2 hours. 3. Empirically start intravenous vancomycin in addition to the     Levaquin.     Wilson Singer, M.D.     NCG/MEDQ  D:  03/19/2011  T:  03/19/2011  Job:  [id number]  Electronically Signed by Lilly Cove M.D. on 03/20/2011 02:24:41 PM

## 2011-03-20 NOTE — Group Therapy Note (Signed)
  NAME:  Hueber, Cecille                    ACCOUNT NO.:  [id number]  MEDICAL RECORD NO.:  [id number]  LOCATION:                                 FACILITY:  PHYSICIAN:  Wilson Singer, M.D.DATE OF BIRTH:  1981/05/31  DATE OF PROCEDURE:  03/20/2011 DATE OF DISCHARGE:                                PROGRESS NOTE   This lady has improved and she says that her pain is better controlled since I increased her oxycodone yesterday.  She has better appetite also today.  She has not really had any significant fevers although she did have a 100.7 yesterday afternoon.  PHYSICAL EXAMINATION:  VITAL SIGNS:  Today, temperature 98.2, blood pressure 96/61, pulse 93, saturation 96% on room air. CARDIAC:  Heart sounds are present and normal. CHEST:  Lung fields are clear with no evidence of bronchial breathing or crackles.  INVESTIGATIONS:  Hemoglobin 8.7, white blood cell count 6.1, platelets 175.  Electrolytes normal but bilirubin is creeping up again at 5.5.  IMPRESSION: 1. Sickle cell vaso-occlusive crisis. 2. Fever, blood cultures negative.  PLAN:  Reduce IV fluids.  Possible discharge home tomorrow if she does well.     Wilson Singer, M.D.     NCG/MEDQ  D:  03/20/2011  T:  03/20/2011  Job:  [id number]  Electronically Signed by Lilly Cove M.D. on 03/20/2011 02:24:50 PM

## 2011-03-21 LAB — DIFFERENTIAL
Monocytes Relative: 17 % — ABNORMAL HIGH (ref 3–12)
Neutrophils Relative %: 36 % — ABNORMAL LOW (ref 43–77)

## 2011-03-21 LAB — COMPREHENSIVE METABOLIC PANEL
ALT: 36 U/L — ABNORMAL HIGH (ref 0–35)
Alkaline Phosphatase: 217 U/L — ABNORMAL HIGH (ref 39–117)
BUN: 5 mg/dL — ABNORMAL LOW (ref 6–23)
CO2: 27 mEq/L (ref 19–32)
Glucose, Bld: 94 mg/dL (ref 70–99)
Potassium: 3.6 mEq/L (ref 3.5–5.1)
Total Bilirubin: 3.3 mg/dL — ABNORMAL HIGH (ref 0.3–1.2)
Total Protein: 7 g/dL (ref 6.0–8.3)

## 2011-03-21 LAB — CULTURE, BLOOD (ROUTINE X 2)
Culture: NO GROWTH
Culture: NO GROWTH

## 2011-03-21 LAB — URINE CULTURE

## 2011-03-21 LAB — CBC
Hemoglobin: 7.9 g/dL — ABNORMAL LOW (ref 12.0–15.0)
MCH: 31.9 pg (ref 26.0–34.0)
RBC: 2.48 MIL/uL — ABNORMAL LOW (ref 3.87–5.11)

## 2011-03-22 LAB — RETICULOCYTES
RBC.: 2.5 MIL/uL — ABNORMAL LOW (ref 3.87–5.11)
Retic Ct Pct: 13.4 % — ABNORMAL HIGH (ref 0.4–3.1)

## 2011-03-29 NOTE — Discharge Summary (Signed)
NAME:  Dawn [name], Dawn [name]                    ACCOUNT NO.:  [id number]  MEDICAL RECORD NO.:  [id number]  LOCATION:  A326                          FACILITY:  [facility]  PHYSICIAN:  Dyanne Yorks L. Lendell [name], MDDATE OF BIRTH:  02/27/1981  DATE OF ADMISSION:  03/15/2011 DATE OF DISCHARGE:  06/27/2012LH                              DISCHARGE SUMMARY   DISCHARGE DIAGNOSES: 1. Sickle cell pain crisis. 2. Fever without other signs of infection status post a course of     Levaquin and vancomycin.  DISCHARGE MEDICATIONS: 1. Tylenol 650 mg p.o. q.4 hours p.r.n. pain or fever. 2. Ibuprofen 800 mg p.o. q.8 hours p.r.n. pain or fever.  Continue her preadmission medications which include: 1. Folic acid 1 mg a day. 2. Oxycodone 5 mg p.o. q.4 hours p.r.n. pain. 3. Hydroxyurea 1000 mg alternating with 500 mg a day. 4. Cozaar 25 mg a day. 5. Duragesic 25 mcg every 3 days.  CONDITION:  Stable.  ACTIVITY:  Ad lib.  Follow up with the [facility].  CONSULTATIONS:  None.  PROCEDURES:  Transfusion of 2 units of packed red blood cells.  DIET:  Regular.  LABS ON ADMISSION:  White blood cell count was 11,000 with 41% neutrophils, 50% lymphocytes, hemoglobin 7.9, hematocrit 21.2, platelet count 173, hemoglobin dropped to a low of 6.3 and after transfusion at discharge, her hemoglobin was 7.9, hematocrit 22, reticulocyte count is 717, reticulocyte percentage of 32%.  Complete metabolic panel on admission significant for a glucose of 161, total bilirubin of 1.1, indirect bilirubin of 5.6, alkaline phosphatase of 164, SGOT of 99.  At discharge, her total bilirubin had decreased to 3.3.  Urine pregnancy negative.  Urinalysis x3 was negative for nitrites or leukocyte esterase.  She did have moderate bilirubin, large blood, greater than 300 protein, granular casts.  Blood cultures negative x4.  Urine culture negative.  DIAGNOSTICS:  Chest x-ray x3 is negative for infiltrate.  HISTORY AND HOSPITAL  COURSE:  Please see H and P for admission details. Dawn [name] is a pleasant 30 year old black female with sickle cell disease who presented with pain crisis.  She presented with her usual distribution of pain.  She had no cough, fevers, shortness of breath, dysuria, or rash.  She is followed at the [facility].  Her temperature on admission was 100.6, heart rate 124.  She had moist mucous membranes, clear lungs, soft abdomen.  She was admitted and started on IV fluids and pain medication.  She also was started empirically on Levaquin due to her fever.  She did have a temperature as high as 101.6 but has been defervescing.  She also was started on vancomycin IV for the past few days.  She has had multiple cultures, urinalysis, and chest x-rays, all of which were negative.  She has had almost a week's worth of antibiotic and I will not send her home on antibiotic.  She required transfusion.  Her pain is much better controlled and she is feeling well enough to manage this at home.  She is to call for an appointment with [facility].  Total time on the day of discharge greater  than 30 minutes.     Dawn Alfredo L. Lendell Caprice, MD     CLS/MEDQ  D:  03/21/2011  T:  03/21/2011  Job:  [id number]  Electronically Signed by Crista Curb MD on 03/29/2011 07:15:35 PM

## 2011-04-26 ENCOUNTER — Emergency Department (HOSPITAL_COMMUNITY): Payer: Medicare Other

## 2011-04-26 ENCOUNTER — Inpatient Hospital Stay (HOSPITAL_COMMUNITY)
Admission: EM | Admit: 2011-04-26 | Discharge: 2011-04-30 | DRG: 812 | Disposition: A | Discharge status: Home / Self Care | Payer: Medicare Other | Attending: Internal Medicine | Admitting: Internal Medicine

## 2011-04-26 ENCOUNTER — Encounter: Payer: Self-pay | Admitting: *Deleted

## 2011-04-26 DIAGNOSIS — R7401 Elevation of levels of liver transaminase levels: Secondary | ICD-10-CM | POA: Diagnosis present

## 2011-04-26 DIAGNOSIS — R0902 Hypoxemia: Secondary | ICD-10-CM

## 2011-04-26 DIAGNOSIS — R7402 Elevation of levels of lactic acid dehydrogenase (LDH): Secondary | ICD-10-CM | POA: Diagnosis present

## 2011-04-26 DIAGNOSIS — D5700 Hb-SS disease with crisis, unspecified: Secondary | ICD-10-CM | POA: Diagnosis present

## 2011-04-26 DIAGNOSIS — R509 Fever, unspecified: Secondary | ICD-10-CM | POA: Diagnosis present

## 2011-04-26 DIAGNOSIS — D571 Sickle-cell disease without crisis: Secondary | ICD-10-CM

## 2011-04-26 HISTORY — DX: Sickle-cell disease without crisis: D57.1

## 2011-04-26 LAB — URINE MICROSCOPIC-ADD ON

## 2011-04-26 LAB — CBC
HCT: 24.4 % — ABNORMAL LOW (ref 36.0–46.0)
Hemoglobin: 8.8 g/dL — ABNORMAL LOW (ref 12.0–15.0)
MCH: 35.6 pg — ABNORMAL HIGH (ref 26.0–34.0)
MCHC: 36.1 g/dL — ABNORMAL HIGH (ref 30.0–36.0)
Platelets: 338 10*3/uL (ref 150–400)

## 2011-04-26 LAB — DIFFERENTIAL
Basophils Absolute: 0 10*3/uL (ref 0.0–0.1)
Eosinophils Relative: 0 % (ref 0–5)
Lymphocytes Relative: 5 % — ABNORMAL LOW (ref 12–46)
Lymphs Abs: 0.7 10*3/uL (ref 0.7–4.0)
Monocytes Relative: 4 % (ref 3–12)
Neutrophils Relative %: 91 % — ABNORMAL HIGH (ref 43–77)

## 2011-04-26 LAB — BASIC METABOLIC PANEL
CO2: 20 mEq/L (ref 19–32)
Calcium: 8.9 mg/dL (ref 8.4–10.5)
Chloride: 102 mEq/L (ref 96–112)
Glucose, Bld: 89 mg/dL (ref 70–99)
Potassium: 3.9 mEq/L (ref 3.5–5.1)
Sodium: 137 mEq/L (ref 135–145)

## 2011-04-26 LAB — RETICULOCYTES
Retic Count, Absolute: 565.6 10*3/uL — ABNORMAL HIGH (ref 19.0–186.0)
Retic Ct Pct: 22.9 % — ABNORMAL HIGH (ref 0.4–3.1)

## 2011-04-26 LAB — URINALYSIS, ROUTINE W REFLEX MICROSCOPIC
Glucose, UA: NEGATIVE mg/dL
Leukocytes, UA: NEGATIVE
Protein, ur: 30 mg/dL — AB
Specific Gravity, Urine: 1.015 (ref 1.005–1.030)

## 2011-04-26 LAB — POCT PREGNANCY, URINE: Preg Test, Ur: NEGATIVE

## 2011-04-26 MED ORDER — SODIUM CHLORIDE 0.9 % IV BOLUS (SEPSIS)
1000.0000 mL | Freq: Once | INTRAVENOUS | Status: AC
  Administered 2011-04-26: 1000 mL via INTRAVENOUS

## 2011-04-26 MED ORDER — HYDROMORPHONE HCL 2 MG/ML IJ SOLN
2.0000 mg | Freq: Once | INTRAMUSCULAR | Status: AC
  Administered 2011-04-26: 2 mg via INTRAVENOUS
  Filled 2011-04-26: qty 1

## 2011-04-26 MED ORDER — HYDROMORPHONE HCL 1 MG/ML IJ SOLN
1.0000 mg | Freq: Once | INTRAMUSCULAR | Status: AC
  Administered 2011-04-26: 1 mg via INTRAVENOUS
  Filled 2011-04-26: qty 1

## 2011-04-26 MED ORDER — IBUPROFEN 400 MG PO TABS
400.0000 mg | ORAL_TABLET | Freq: Once | ORAL | Status: AC
  Administered 2011-04-26: 400 mg via ORAL
  Filled 2011-04-26: qty 1

## 2011-04-26 MED ORDER — ONDANSETRON HCL 4 MG/2ML IJ SOLN
4.0000 mg | Freq: Once | INTRAMUSCULAR | Status: AC
  Administered 2011-04-26: 4 mg via INTRAVENOUS
  Filled 2011-04-26: qty 2

## 2011-04-26 MED ORDER — SODIUM CHLORIDE 0.9 % IV SOLN
INTRAVENOUS | Status: DC
  Administered 2011-04-26 – 2011-04-27 (×2): via INTRAVENOUS

## 2011-04-26 MED ORDER — OXYCODONE HCL 5 MG PO TABS
10.0000 mg | ORAL_TABLET | ORAL | Status: DC | PRN
  Administered 2011-04-26 – 2011-04-30 (×2): 10 mg via ORAL
  Filled 2011-04-26: qty 1
  Filled 2011-04-26: qty 2
  Filled 2011-04-26: qty 1

## 2011-04-26 MED ORDER — VANCOMYCIN HCL IN DEXTROSE 1-5 GM/200ML-% IV SOLN
1000.0000 mg | Freq: Once | INTRAVENOUS | Status: AC
  Administered 2011-04-26: 1000 mg via INTRAVENOUS
  Filled 2011-04-26: qty 200

## 2011-04-26 MED ORDER — PIPERACILLIN-TAZOBACTAM 3.375 G IVPB
3.3750 g | Freq: Once | INTRAVENOUS | Status: AC
  Administered 2011-04-26: 3.375 g via INTRAVENOUS
  Filled 2011-04-26: qty 50

## 2011-04-26 NOTE — ED Notes (Signed)
Pt lower back pain, fever that started today, Dr. Bebe Shaggy in prior to RN, see EDP assessment for further

## 2011-04-26 NOTE — ED Notes (Signed)
Pt given sandwich 

## 2011-04-26 NOTE — H&P (Signed)
Dawn [name] is an 30 y.o. female.    Chief Complaint: Fever and back pain  HPI: This is a 30 year old, [name], who is well known to our service for multiple admissions over the last many months. Dawn [name] has a history of sickle cell disease. Dawn [name] is usually followed by Dr. James Ivanoff in at [facility].  Dawn [name] actually was seen by Dr. James Ivanoff this morning. Dawn [name] did not have any symptoms of concern this morning. Dawn [name] came back home and Dawn [name] felt very tired. Felt very weak. Her legs and back started hurting. Dawn [name] felt cold, and check her temperature which was 59F. Dawn [name] took a Motrin and went to lie down. After some time the temperature was 101.2F. Then it increased to 102F. Denies any vomiting. Did have some nausea. Denies any abdominal pain, or diarrhea. Denies any dysuria, any vaginal discharge. Does have headache, but denies any neck pain. Dawn [name] tells me that her son was sick with cold and cough symptoms recently. Dawn [name] has some shortness of breath, which Dawn [name] attributes to her pain. Denies any chest pain, has a dry cough. Denies any rashes. Dawn [name] said Dawn [name] was stung by a bee recently, but denies any rash in that site. Denies any other sick contacts. No other joint pains apart from her sickle cell pain.   Prior to Admission medications   Medication Sig Start Date End Date Taking? Authorizing Provider  folic acid (FOLVITE) 1 MG tablet Take 1 mg by mouth daily.     Yes Historical Provider, MD  hydroxyurea (HYDREA) 500 MG capsule Take 500-1,000 mg by mouth as directed. Take 500mg  (1 tablet) po on all ODD days (Mondays, Wednesdays, Fridays, Sundays) and take 1000mg  (2 Tablets) on all EVEN days (Tuesdays, Thursdays, Saturdays).  take with food to minimize GI side effects.    Yes Historical Provider, MD  oxycodone (OXY-IR) 5 MG capsule Take 10 mg by mouth every 4 (four) hours as needed. For pain    Yes Historical Provider, MD    Allergies:  Allergies  Allergen Reactions  . Latex Other (See  Comments)    REACTION: Pt experiences a burning sensation on contacted skin areas  . Lisinopril Other (See Comments) and Cough    REACTION: Sore/scratchy throat  . Tape Other (See Comments)    REACTION: Pt. Experiences a burning sensation on contacted skin areas  . Tylenol (Acetaminophen) Other (See Comments)    Pt. Does not take the following due to protection of kidney health. Past occurrence of Protein in urine.    Past Medical History  Diagnosis Date  . Sickle cell disease   . Sickle cell disease, type S     Past Surgical History  Procedure Date  . Cholecystectomy   .  left knee acl reconstruction   . Cesarean section     x 2  . Portacath placement    The most recent Port-A-Cath was placed in April of 2012.   Social History:  reports that Dawn [name] has never smoked. Dawn [name] does not have any smokeless tobacco history on file. Dawn [name] reports that Dawn [name] does not drink alcohol or use illicit drugs.  Family History: History reviewed. No pertinent family history.  Review of Systems  Constitutional: Positive for fever, chills and malaise/fatigue. Negative for weight loss and diaphoresis.  HENT: Negative for hearing loss, ear pain, nosebleeds, tinnitus and ear discharge.   Eyes: Negative for blurred vision, double vision, photophobia, pain, discharge and redness.  Respiratory: Positive for cough and shortness of  breath. Negative for hemoptysis, sputum production and wheezing.   Cardiovascular: Negative for chest pain, palpitations, orthopnea, claudication and leg swelling.  Gastrointestinal: Positive for nausea. Negative for vomiting, abdominal pain, diarrhea and constipation.  Genitourinary: Negative.   Musculoskeletal: Positive for myalgias, back pain and joint pain.  Skin: Negative for itching and rash.  Neurological: Positive for weakness and headaches. Negative for dizziness, tingling and tremors.  Endo/Heme/Allergies: Negative.   Psychiatric/Behavioral: Negative.     Blood pressure  121/60, pulse 117, temperature 101.4 F (38.6 C), temperature source Oral, resp. rate 20, height 5\' 3"  (1.6 m), weight 62.596 kg (138 lb), last menstrual period 04/09/2011, SpO2 92.00%. Physical Exam  Vitals reviewed. Constitutional: Dawn [name] appears well-developed and well-nourished. No distress.  HENT:  Head: Normocephalic and atraumatic.  Nose: Nose normal.  Mouth/Throat: Oropharynx is clear and moist. No oropharyngeal exudate.  Eyes: Conjunctivae and EOM are normal. Pupils are equal, round, and reactive to light. Right eye exhibits no discharge. Left eye exhibits no discharge. Scleral icterus is present.  Neck: Normal range of motion. Neck supple. No JVD present. No tracheal deviation present. No thyromegaly present.  Cardiovascular: Normal heart sounds and intact distal pulses.  Tachycardia present.   Pulmonary/Chest: Effort normal and breath sounds normal. No stridor. No respiratory distress. Dawn [name] has no wheezes. Dawn [name] has no rales. Dawn [name] exhibits no tenderness.  Abdominal: Soft. Bowel sounds are normal. Dawn [name] exhibits no distension and no mass. There is no tenderness. There is no rebound and no guarding.  Musculoskeletal: Normal range of motion.       Arms: Lymphadenopathy:    Dawn [name] has no cervical adenopathy.  Skin: Dawn [name] is not diaphoretic.     Results for orders placed during the hospital encounter of 04/26/11 (from the past 48 hour(s))  CBC     Status: Abnormal   Collection Time   04/26/11  7:00 PM      Component Value Range Comment   WBC 13.1 (*) 4.0 - 10.5 (K/uL)    RBC 2.47 (*) 3.87 - 5.11 (MIL/uL)    Hemoglobin 8.8 (*) 12.0 - 15.0 (g/dL)    HCT 16.1 (*) 09.6 - 46.0 (%)    MCV 98.8  78.0 - 100.0 (fL)    MCH 35.6 (*) 26.0 - 34.0 (pg)    MCHC 36.1 (*) 30.0 - 36.0 (g/dL)    RDW 04.5 (*) 40.9 - 15.5 (%)    Platelets 338  150 - 400 (K/uL)   DIFFERENTIAL     Status: Abnormal   Collection Time   04/26/11  7:00 PM      Component Value Range Comment   Neutrophils Relative 91 (*) 43 - 77 (%)     Lymphocytes Relative 5 (*) 12 - 46 (%)    Monocytes Relative 4  3 - 12 (%)    Eosinophils Relative 0  0 - 5 (%)    Basophils Relative 0  0 - 1 (%)    Neutro Abs 11.9 (*) 1.7 - 7.7 (K/uL)    Lymphs Abs 0.7  0.7 - 4.0 (K/uL)    Monocytes Absolute 0.5  0.1 - 1.0 (K/uL)    Eosinophils Absolute 0.0  0.0 - 0.7 (K/uL)    Basophils Absolute 0.0  0.0 - 0.1 (K/uL)   BASIC METABOLIC PANEL     Status: Abnormal   Collection Time   04/26/11  7:00 PM      Component Value Range Comment   Sodium 137  135 - 145 (mEq/L)    Potassium  3.9  3.5 - 5.1 (mEq/L)    Chloride 102  96 - 112 (mEq/L)    CO2 20  19 - 32 (mEq/L)    Glucose, Bld 89  70 - 99 (mg/dL)    BUN 5 (*) 6 - 23 (mg/dL)    Creatinine, Ser <9.60 (*) 0.50 - 1.10 (mg/dL) ICTERIC SPECIMEN   Calcium 8.9  8.4 - 10.5 (mg/dL)    GFR calc non Af Amer NOT CALCULATED  >60 (mL/min)    GFR calc Af Amer NOT CALCULATED  >60 (mL/min)   RETICULOCYTES     Status: Abnormal   Collection Time   04/26/11  7:00 PM      Component Value Range Comment   Retic Ct Pct 22.9 (*) 0.4 - 3.1 (%)    RBC. 2.47 (*) 3.87 - 5.11 (MIL/uL)    Retic Count, Manual 565.6 (*) 19.0 - 186.0 (K/uL)   URINALYSIS, ROUTINE W REFLEX MICROSCOPIC     Status: Abnormal   Collection Time   04/26/11  8:10 PM      Component Value Range Comment   Color, Urine YELLOW  YELLOW     Appearance CLEAR  CLEAR     Specific Gravity, Urine 1.015  1.005 - 1.030     pH 7.5  5.0 - 8.0     Glucose, UA NEGATIVE  NEGATIVE (mg/dL)    Hgb urine dipstick MODERATE (*) NEGATIVE     Bilirubin Urine SMALL (*) NEGATIVE     Ketones, ur NEGATIVE  NEGATIVE (mg/dL)    Protein, ur 30 (*) NEGATIVE (mg/dL)    Urobilinogen, UA 1.0  0.0 - 1.0 (mg/dL)    Nitrite NEGATIVE  NEGATIVE     Leukocytes, UA NEGATIVE  NEGATIVE    URINE MICROSCOPIC-ADD ON     Status: Abnormal   Collection Time   04/26/11  8:10 PM      Component Value Range Comment   Squamous Epithelial / LPF RARE  RARE     WBC, UA 0-2  <3 (WBC/hpf)    RBC / HPF 3-6   <3 (RBC/hpf)    Bacteria, UA FEW (*) RARE    POCT PREGNANCY, URINE     Status: Normal   Collection Time   04/26/11  8:19 PM      Component Value Range Comment   Preg Test, Ur NEGATIVE      Dg Chest Portable 1 View  04/26/2011  *RADIOLOGY REPORT*  Clinical Data: Fever.  Sickle cell disease.  PORTABLE CHEST - 1 VIEW  Comparison: 03/19/2011  Findings: Heart size is normal.  Both lungs are clear.  No evidence of pleural effusion.  No mass or adenopathy identified.  Right- sided Port-A-Cath is noted.  IMPRESSION: No active cardiopulmonary disease.  Original Report Authenticated By: Danae Orleans, M.D.     Assessment/Plan  Principal Problem:  *Fever Active Problems:  Sickle cell crisis   So, this is a 30 year old, [name], with a history of sickle cell disease. Dawn [name] was recently admitted to [facility] end of June for fever and back pain. Dawn [name] was found to have crisis at that time. Dawn [name] comes in with similar complaints and has a fever up to 102F.  Plan:  #1 fever: Etiology is unclear. This could be a viral illness. Dawn [name] could have contracted this from her son. Blood cultures urine cultures are pending. Dawn [name] does have a cough. However, her chest x-ray is negative for pneumonia at this time. In any case, we will treat  her for possible pneumonia, which should be considered as a healthcare associated due to the recent hospitalization. So, we will give her vancomycin, Zosyn, and Levaquin. Urine Legionella and strep antigens will be checked.  #2 sickle cell crisis: This will be treated with IV fluids, and narcotic pain medications. If Dawn [name] doesn't do well with the allotted every 2 hours dilaudid, PCA pump may be required. Hemoglobin will be monitored closely. Will continue with hydroxyurea and folic acid.  #3 sickle cell anemia: Hemoglobin will be monitored closely  DVT, prophylaxis will be initiated and patient will be monitored on telemetry. An echocardiogram will be obtained due to her  tachycardia, which is primarily because of the fever, however, because of her recent illness and, because of her Port-A-Cath we will proceed with the echocardiogram.  If patient doesn't improve, consideration will be given to contacting her providers at [facility].  Further management decisions will depend on results of further testing and patient's response to treatment.  Opie Maclaughlin 04/26/2011, 9:14 PM

## 2011-04-26 NOTE — ED Notes (Signed)
Pt c/o abd pain, rates it as a 6

## 2011-04-26 NOTE — ED Notes (Signed)
Pt room air sat 91-92% after walking to restroom, Dr. Bebe Shaggy advised, additional orders given

## 2011-04-26 NOTE — ED Notes (Signed)
Dr. Krishnan here to evaluate pt for admission 

## 2011-04-26 NOTE — ED Notes (Signed)
Pt states that the pain is starting to come back, Dr. Rito Ehrlich notified, additional orders given

## 2011-04-26 NOTE — ED Notes (Signed)
Pt ambulatory to restroom, appears uncomfortable, pt states that the pain medication does not seem to have helped at all, edp notified, additional orders given,

## 2011-04-26 NOTE — ED Provider Notes (Signed)
History    Scribed for Joya Gaskins, MD, the patient was seen in room APA04/APA04. This chart was scribed by Clarita Crane. This patient's care was started at 6:29PM.  CSN: [id number] Arrival date & time: 04/26/2011  6:19 PM  Chief Complaint  Patient presents with  . Shortness of Breath   HPI Patient is a female c/o SOB, fever, cough, left sided lower back pain and LLE pain with associated fever and HA onset this afternoon and persistent since. Denies chest pain, abdominal pain, vaginal bleeding and vaginal d/c. Patient states back pain and pain to LLE are similar to that previously experienced with sickle cell crises. Notes pain was not relieved with 2 aspirin taken 4.5 hours ago. Notes pain is aggravated by nothing. Patient also reports she was stung by a bee yesterday to posterior right lower leg. Patient was seen at [facility] yesterday and was told she had an "iron overload" which they planned to monitor for now. H/o cholecystectomy, C-section, sickle cell disease.   PAST MEDICAL HISTORY:  Past Medical History  Diagnosis Date  . Sickle cell disease   . Sickle cell disease, type S     PAST SURGICAL HISTORY:  Past Surgical History  Procedure Date  . Cholecystectomy   .  left knee acl reconstruction   . Cesarean section     x 2  . Portacath placement     MEDICATIONS:     ALLERGIES:  Allergies as of 04/26/2011 - Review Complete 04/26/2011  Allergen Reaction Noted  . Latex Other (See Comments) 04/26/2011  . Lisinopril Other (See Comments) and Cough 04/26/2011  . Tape Other (See Comments) 04/26/2011  . Tylenol (acetaminophen) Other (See Comments) 04/26/2011     FAMILY HISTORY:  History reviewed. No pertinent family history.   SOCIAL HISTORY: History   Social History  . Marital Status: Single    Spouse Name: N/A    Number of Children: N/A  . Years of Education: N/A   Social History Main Topics  . Smoking status: Never Smoker   . Smokeless tobacco: None  . Alcohol  Use: No  . Drug Use: No  . Sexually Active: Yes    Birth Control/ Protection: IUD   Other Topics Concern  . None   Social History Narrative  . None      OB History    Grav Para Term Preterm Abortions TAB SAB Ect Mult Living                  Review of Systems 10 Systems reviewed and are negative for acute change except as noted in the HPI.  Physical Exam  BP 121/60  Pulse 117  Temp(Src) 101.4 F (38.6 C) (Oral)  Resp 20  Ht 5\' 3"  (1.6 m)  Wt 138 lb (62.596 kg)  BMI 24.45 kg/m2  SpO2 92%  LMP 04/09/2011  Physical Exam CONSTITUTIONAL: Well developed/well nourished, febrile, tachycardia HEAD AND FACE: Normocephalic/atraumatic EYES: EOMI/PERRL ENMT: Mucous membranes moist NECK: supple no meningeal signs SPINE:entire spine nontender CV: S1/S2 noted, no murmurs/rubs/gallops noted, radial pulses normal, DP and PT pulses normal LUNGS: Lungs are clear to auscultation bilaterally, no apparent distress, Port-a-cath inserted right chest and non-tender to palpation ABDOMEN: soft, nontender, no rebound or guarding GU:no cva tenderness NEURO: Pt is awake/alert, moves all extremitiesx4 EXTREMITIES: pulses normal, full ROM SKIN: warm, color normal, no signs of cellulitis PSYCH: no abnormalities of mood noted  ED Course  Procedures  OTHER DATA REVIEWED: Nursing notes, vital signs, and past  medical records reviewed. Previous medical records reviewed and considered All labs/vitals reviewed and considered xrays reviewed and considered  DIAGNOSTIC STUDIES:   LABS / RADIOLOGY: Results for orders placed during the hospital encounter of 04/26/11  CBC      Component Value Range   WBC 13.1 (*) 4.0 - 10.5 (K/uL)   RBC 2.47 (*) 3.87 - 5.11 (MIL/uL)   Hemoglobin 8.8 (*) 12.0 - 15.0 (g/dL)   HCT 40.9 (*) 81.1 - 46.0 (%)   MCV 98.8  78.0 - 100.0 (fL)   MCH 35.6 (*) 26.0 - 34.0 (pg)   MCHC 36.1 (*) 30.0 - 36.0 (g/dL)   RDW 91.4 (*) 78.2 - 15.5 (%)   Platelets 338  150 - 400  (K/uL)  DIFFERENTIAL      Component Value Range   Neutrophils Relative 91 (*) 43 - 77 (%)   Lymphocytes Relative 5 (*) 12 - 46 (%)   Monocytes Relative 4  3 - 12 (%)   Eosinophils Relative 0  0 - 5 (%)   Basophils Relative 0  0 - 1 (%)   Neutro Abs 11.9 (*) 1.7 - 7.7 (K/uL)   Lymphs Abs 0.7  0.7 - 4.0 (K/uL)   Monocytes Absolute 0.5  0.1 - 1.0 (K/uL)   Eosinophils Absolute 0.0  0.0 - 0.7 (K/uL)   Basophils Absolute 0.0  0.0 - 0.1 (K/uL)  BASIC METABOLIC PANEL      Component Value Range   Sodium 137  135 - 145 (mEq/L)   Potassium 3.9  3.5 - 5.1 (mEq/L)   Chloride 102  96 - 112 (mEq/L)   CO2 20  19 - 32 (mEq/L)   Glucose, Bld 89  70 - 99 (mg/dL)   BUN 5 (*) 6 - 23 (mg/dL)   Creatinine, Ser <9.56 (*) 0.50 - 1.10 (mg/dL)   Calcium 8.9  8.4 - 21.3 (mg/dL)   GFR calc non Af Amer NOT CALCULATED  >60 (mL/min)   GFR calc Af Amer NOT CALCULATED  >60 (mL/min)  URINALYSIS, ROUTINE W REFLEX MICROSCOPIC      Component Value Range   Color, Urine YELLOW  YELLOW    Appearance CLEAR  CLEAR    Specific Gravity, Urine 1.015  1.005 - 1.030    pH 7.5  5.0 - 8.0    Glucose, UA NEGATIVE  NEGATIVE (mg/dL)   Hgb urine dipstick MODERATE (*) NEGATIVE    Bilirubin Urine SMALL (*) NEGATIVE    Ketones, ur NEGATIVE  NEGATIVE (mg/dL)   Protein, ur 30 (*) NEGATIVE (mg/dL)   Urobilinogen, UA 1.0  0.0 - 1.0 (mg/dL)   Nitrite NEGATIVE  NEGATIVE    Leukocytes, UA NEGATIVE  NEGATIVE   RETICULOCYTES      Component Value Range   Retic Ct Pct 22.9 (*) 0.4 - 3.1 (%)   RBC. 2.47 (*) 3.87 - 5.11 (MIL/uL)   Retic Count, Manual 565.6 (*) 19.0 - 186.0 (K/uL)  POCT PREGNANCY, URINE      Component Value Range   Preg Test, Ur NEGATIVE    URINE MICROSCOPIC-ADD ON      Component Value Range   Squamous Epithelial / LPF RARE  RARE    WBC, UA 0-2  <3 (WBC/hpf)   RBC / HPF 3-6  <3 (RBC/hpf)   Bacteria, UA FEW (*) RARE      Dg Chest Portable 1 View  04/26/2011  *RADIOLOGY REPORT*  Clinical Data: Fever.  Sickle cell  disease.  PORTABLE CHEST - 1 VIEW  Comparison: 03/19/2011  Findings: Heart size is normal.  Both lungs are clear.  No evidence of pleural effusion.  No mass or adenopathy identified.  Right- sided Port-A-Cath is noted.  IMPRESSION: No active cardiopulmonary disease.  Original Report Authenticated By: Danae Orleans, M.D.   PROCEDURES:  ED COURSE / COORDINATION OF CARE: Pt has h/o sickle cell with cough/fever/sob/myalgias.  She has port in place but is nontender.  Will load with vancomycin given fever and sickle cell disease 8:27PM- Patient informed of lab and imaging results and intent to admit to hospital. Patient consents to be admitted. 8:37PM- Consult complete with Dr. Rito Ehrlich regarding request to admit patient. Patient's case discussed with Rito Ehrlich who agrees to evaluate patient.  MDM:  Pt with sickle cell/fever, abx ordered Stabilized in ED  PLAN:  Admit The patient is to return the emergency department if there is any worsening of symptoms. I have reviewed the discharge instructions with the patient/family    MEDICATIONS GIVEN IN THE E.D.  Medications  hydroxyurea (HYDREA) 500 MG capsule (not administered)  folic acid (FOLVITE) 1 MG tablet (not administered)  oxycodone (OXY-IR) 5 MG capsule (not administered)  sodium chloride 0.9 % bolus 1,000 mL (1000 mL Intravenous Given 04/26/11 2026)  sodium chloride 0.9 % bolus 1,000 mL (1000 mL Intravenous Given 04/26/11 1924)  ibuprofen (ADVIL,MOTRIN) tablet 400 mg (400 mg Oral Given 04/26/11 1924)  HYDROmorphone (DILAUDID) injection 1 mg (1 mg Intravenous Given 04/26/11 1924)  ondansetron (ZOFRAN) injection 4 mg (4 mg Intravenous Given 04/26/11 1924)  vancomycin (VANCOCIN) IVPB 1000 mg/200 mL premix (1000 mg Intravenous Given 04/26/11 2007)  HYDROmorphone (DILAUDID) injection 1 mg (1 mg Intravenous Given 04/26/11 2024)    I personally performed the services described in this documentation, which was scribed in my presence. The recorded  information has been reviewed and considered. Joya Gaskins, MD         Joya Gaskins, MD 04/26/11 2123

## 2011-04-26 NOTE — ED Notes (Signed)
Pt c/o fever, shortness of breath, lower back pain, pain in rt leg and headache since 1 pm this afternoon. Pt also c/o being stung by a bee in the back of her rt leg yesterday. Pt has sickle cell disease.

## 2011-04-27 ENCOUNTER — Encounter (HOSPITAL_COMMUNITY): Payer: Self-pay | Admitting: *Deleted

## 2011-04-27 ENCOUNTER — Inpatient Hospital Stay (HOSPITAL_COMMUNITY): Payer: Medicare Other

## 2011-04-27 DIAGNOSIS — D571 Sickle-cell disease without crisis: Secondary | ICD-10-CM | POA: Diagnosis present

## 2011-04-27 DIAGNOSIS — I369 Nonrheumatic tricuspid valve disorder, unspecified: Secondary | ICD-10-CM

## 2011-04-27 DIAGNOSIS — R7989 Other specified abnormal findings of blood chemistry: Secondary | ICD-10-CM | POA: Diagnosis present

## 2011-04-27 LAB — CBC
HCT: 22.3 % — ABNORMAL LOW (ref 36.0–46.0)
MCH: 35.9 pg — ABNORMAL HIGH (ref 26.0–34.0)
MCV: 100 fL (ref 78.0–100.0)
RDW: 25.4 % — ABNORMAL HIGH (ref 11.5–15.5)
WBC: 9.9 10*3/uL (ref 4.0–10.5)

## 2011-04-27 LAB — COMPREHENSIVE METABOLIC PANEL
ALT: 57 U/L — ABNORMAL HIGH (ref 0–35)
AST: 123 U/L — ABNORMAL HIGH (ref 0–37)
Albumin: 3 g/dL — ABNORMAL LOW (ref 3.5–5.2)
Alkaline Phosphatase: 144 U/L — ABNORMAL HIGH (ref 39–117)
Potassium: 3.6 mEq/L (ref 3.5–5.1)
Sodium: 137 mEq/L (ref 135–145)
Total Protein: 7 g/dL (ref 6.0–8.3)

## 2011-04-27 LAB — RETICULOCYTES
RBC.: 2.23 MIL/uL — ABNORMAL LOW (ref 3.87–5.11)
Retic Count, Absolute: 472.8 10*3/uL — ABNORMAL HIGH (ref 19.0–186.0)
Retic Ct Pct: 21.2 % — ABNORMAL HIGH (ref 0.4–3.1)

## 2011-04-27 LAB — SEDIMENTATION RATE: Sed Rate: 24 mm/hr — ABNORMAL HIGH (ref 0–22)

## 2011-04-27 MED ORDER — SENNA 8.6 MG PO TABS: 2.0000 | ORAL_TABLET | Freq: Every day | ORAL | Status: DC | PRN

## 2011-04-27 MED ORDER — FOLIC ACID 1 MG PO TABS
1.0000 mg | ORAL_TABLET | Freq: Every day | ORAL | Status: DC
  Administered 2011-04-27 – 2011-04-30 (×4): 1 mg via ORAL
  Filled 2011-04-27 (×4): qty 1

## 2011-04-27 MED ORDER — HYDROXYUREA 500 MG PO CAPS
500.0000 mg | ORAL_CAPSULE | ORAL | Status: DC
  Administered 2011-04-27 – 2011-04-30 (×3): 500 mg via ORAL
  Filled 2011-04-27 (×3): qty 1

## 2011-04-27 MED ORDER — LEVOFLOXACIN IN D5W 500 MG/100ML IV SOLN
500.0000 mg | INTRAVENOUS | Status: DC
  Administered 2011-04-27 – 2011-04-28 (×2): 500 mg via INTRAVENOUS
  Filled 2011-04-27 (×3): qty 100

## 2011-04-27 MED ORDER — IBUPROFEN 800 MG PO TABS
400.0000 mg | ORAL_TABLET | Freq: Three times a day (TID) | ORAL | Status: DC | PRN
  Administered 2011-04-27: 400 mg via ORAL
  Filled 2011-04-27: qty 1

## 2011-04-27 MED ORDER — ONDANSETRON HCL 4 MG PO TABS: 4.0000 mg | ORAL_TABLET | Freq: Four times a day (QID) | ORAL | Status: DC | PRN

## 2011-04-27 MED ORDER — LEVOFLOXACIN IN D5W 500 MG/100ML IV SOLN
INTRAVENOUS | Status: AC
  Filled 2011-04-27: qty 100

## 2011-04-27 MED ORDER — HYDROXYUREA 500 MG PO CAPS
1000.0000 mg | ORAL_CAPSULE | ORAL | Status: DC
  Administered 2011-04-28: 1000 mg via ORAL
  Filled 2011-04-27 (×2): qty 2

## 2011-04-27 MED ORDER — HYDROXYUREA 500 MG PO CAPS: 500.0000 mg | ORAL_CAPSULE | ORAL | Status: DC

## 2011-04-27 MED ORDER — HYDROMORPHONE HCL 1 MG/ML IJ SOLN
1.0000 mg | INTRAMUSCULAR | Status: DC | PRN
  Administered 2011-04-27 – 2011-04-30 (×27): 1 mg via INTRAVENOUS
  Filled 2011-04-27 (×27): qty 1

## 2011-04-27 MED ORDER — VANCOMYCIN HCL IN DEXTROSE 1-5 GM/200ML-% IV SOLN
1000.0000 mg | Freq: Two times a day (BID) | INTRAVENOUS | Status: AC
  Administered 2011-04-27 – 2011-04-29 (×5): 1000 mg via INTRAVENOUS
  Filled 2011-04-27 (×7): qty 200

## 2011-04-27 MED ORDER — PIPERACILLIN-TAZOBACTAM 3.375 G IVPB
3.3750 g | Freq: Three times a day (TID) | INTRAVENOUS | Status: DC
  Administered 2011-04-27 – 2011-04-29 (×7): 3.375 g via INTRAVENOUS
  Filled 2011-04-27 (×10): qty 50

## 2011-04-27 MED ORDER — LEVALBUTEROL HCL 0.63 MG/3ML IN NEBU: 0.6300 mg | INHALATION_SOLUTION | Freq: Four times a day (QID) | RESPIRATORY_TRACT | Status: DC | PRN

## 2011-04-27 MED ORDER — POTASSIUM CHLORIDE IN NACL 20-0.45 MEQ/L-% IV SOLN
INTRAVENOUS | Status: DC
  Administered 2011-04-27 – 2011-04-28 (×2): via INTRAVENOUS
  Filled 2011-04-27 (×10): qty 1000

## 2011-04-27 MED ORDER — PROMETHAZINE HCL 12.5 MG PO TABS: 12.5000 mg | ORAL_TABLET | Freq: Four times a day (QID) | ORAL | Status: DC | PRN

## 2011-04-27 MED ORDER — PROMETHAZINE HCL 25 MG/ML IJ SOLN: 12.5000 mg | Freq: Four times a day (QID) | INTRAMUSCULAR | Status: DC | PRN

## 2011-04-27 MED ORDER — ENOXAPARIN SODIUM 40 MG/0.4ML ~~LOC~~ SOLN
40.0000 mg | Freq: Every day | SUBCUTANEOUS | Status: DC
  Administered 2011-04-27 – 2011-04-30 (×4): 40 mg via SUBCUTANEOUS
  Filled 2011-04-27 (×4): qty 0.4

## 2011-04-27 MED ORDER — ONDANSETRON HCL 4 MG/2ML IJ SOLN
4.0000 mg | Freq: Four times a day (QID) | INTRAMUSCULAR | Status: DC | PRN
  Administered 2011-04-27 (×2): 4 mg via INTRAVENOUS
  Filled 2011-04-27 (×2): qty 2

## 2011-04-27 MED ORDER — DOCUSATE SODIUM 100 MG PO CAPS
100.0000 mg | ORAL_CAPSULE | Freq: Two times a day (BID) | ORAL | Status: DC
  Administered 2011-04-27 – 2011-04-30 (×8): 100 mg via ORAL
  Filled 2011-04-27 (×8): qty 1

## 2011-04-27 MED ORDER — SODIUM CHLORIDE 0.9 % IJ SOLN
INTRAMUSCULAR | Status: AC
  Filled 2011-04-27: qty 20

## 2011-04-27 NOTE — Progress Notes (Signed)
*  PRELIMINARY RESULTS* Echocardiogram 2D Echocardiogram has been performed.  Dawn [name] 04/27/2011, 10:21 AM

## 2011-04-27 NOTE — Consult Note (Signed)
ANTIBIOTIC CONSULT NOTE - INITIAL  Pharmacy Consult for vancomycin and zosyn Indication: rule out pneumonia  Patient Measurements: Height: 5\' 3"  (160 cm) Weight: 138 lb (62.596 kg) IBW/kg (Calculated) : 52.4   Vital Signs: Temp: 99.6 F (37.6 C) (08/03 0539) Temp src: Oral (08/03 0539) BP: 103/65 mmHg (08/03 0539) Pulse Rate: 104  (08/03 0539) Intake/Output from previous day: 08/02 0701 - 08/03 0700 In: 3440 [P.O.:440; I.V.:2000; IV Piggyback:1000] Out: 1150 [Urine:1150]  Labs:  [facility] 04/27/11 0602 04/26/11 1900  WBC 9.9 13.1*  HGB 8.0* 8.8*  PLT 326 338  LABCREA -- --  CREATININE <0.47* <0.47*  CRCLEARANCE -- --   Microbiology: No results found for this or any previous visit (from the past 720 hour(s)).  Medical History: Past Medical History  Diagnosis Date  . Sickle cell disease   . Sickle cell disease, type S     Medications:  Scheduled:    . docusate sodium  100 mg Oral BID  . enoxaparin  40 mg Subcutaneous Q0600  . folic acid  1 mg Oral Daily  . HYDROmorphone  1 mg Intravenous Once  . HYDROmorphone  1 mg Intravenous Once  . HYDROmorphone  2 mg Intravenous Once  . hydroxyurea  500-1,000 mg Oral UD  . ibuprofen  400 mg Oral Once  . levofloxacin (LEVAQUIN) IV  500 mg Intravenous Q24H  . ondansetron  4 mg Intravenous Once  . piperacillin-tazobactam (ZOSYN)  IV  3.375 g Intravenous Once  . piperacillin-tazobactam (ZOSYN)  IV  3.375 g Intravenous Q8H  . sodium chloride  1,000 mL Intravenous Once  . sodium chloride  1,000 mL Intravenous Once  . sodium chloride      . vancomycin  1,000 mg Intravenous Once  . vancomycin  1,000 mg Intravenous Q12H   Assessment: Good renal fxn  Goal of Therapy:  Eradicate infection. Vancomycin trough level 15-20  Plan:  Follow up culture results Zosyn 3.375gm iv q8hrs Vancomycin 1gm q12hrs, check trough at steady state.  Margo Aye, Bryelle Spiewak A 04/27/2011,7:39 AM

## 2011-04-27 NOTE — Progress Notes (Signed)
Subjective: The patient states that she feels a little better. Her pain is being managed well.  Objective: Vital signs in last 24 hours: Filed Vitals:   04/27/11 0022 04/27/11 0539 04/27/11 0807 04/27/11 1456  BP: 118/74 103/65  113/75  Pulse: 95 104 96 105  Temp: 98.1 F (36.7 C) 99.6 F (37.6 C)  98.4 F (36.9 C)  TempSrc: Oral Oral  Oral  Resp: 17 18 18 18   Height: 5\' 3"  (1.6 m)     Weight: 62.596 kg (138 lb)     SpO2: 99% 97% 97% 92%    Intake/Output Summary (Last 24 hours) at 04/27/11 1749 Last data filed at 04/27/11 1700  Gross per 24 hour  Intake   3920 ml  Output   2550 ml  Net   1370 ml    Weight change:   Exam: General: The patient is currently sitting up in bed. She has no complaints of worsening pain. No complaints of nausea vomiting or diarrhea. HEENT: Head is normocephalic nontraumatic. Pupils are equal round reactive to light. Oropharynx reveals no evidence of exudates or erythema. Lungs: Decreased breath sounds in the bases otherwise clear. Heart: S1-S2 with a 1-2/6 systolic murmur. Abdomen: Positive bowel sounds, mildly tender in the hypogastric area, non distended, no masses palpated. Extremities/musculoskeletal: Mildly tender over her lower extremities bilaterally. Mild tenderness over the lumbosacral muscles. There is no evidence of acute edema, erythema, or acute hot red joints.   Lab Results: Basic Metabolic Panel:  Basename 04/27/11 0602 04/26/11 1900  NA 137 137  K 3.6 3.9  CL 104 102  CO2 21 20  GLUCOSE 98 89  BUN 5* 5*  CREATININE <0.47* <0.47*  CALCIUM 8.4 8.9  MG -- --  PHOS -- --   Liver Function Tests:  [facility] 04/27/11 0602  AST 123*  ALT 57*  ALKPHOS 144*  BILITOT 10.1*  PROT 7.0  ALBUMIN 3.0*   No results found for this basename: LIPASE:2,AMYLASE:2 in the last 72 hours CBC:  Basename 04/27/11 0602 04/26/11 1900  WBC 9.9 13.1*  NEUTROABS -- 11.9*  HGB 8.0* 8.8*  HCT 22.3* 24.4*  MCV 100.0 98.8  PLT 326 338    Cardiac Enzymes: No results found for this basename: CKTOTAL:3,CKMB:3,CKMBINDEX:3,TROPONINI:3 in the last 72 hours BNP: No results found for this basename: POCBNP:3 in the last 72 hours D-Dimer: No results found for this basename: DDIMER:2 in the last 72 hours CBG: No results found for this basename: GLUCAP:6 in the last 72 hours Hemoglobin A1C: No results found for this basename: HGBA1C in the last 72 hours Fasting Lipid Panel: No results found for this basename: CHOL,HDL,LDLCALC,TRIG,CHOLHDL,LDLDIRECT in the last 72 hours Thyroid Function Tests:  Basename 04/27/11 0602  TSH 1.196  T4TOTAL --  FREET4 --  T3FREE --  THYROIDAB --   Anemia Panel:  Basename 04/27/11 0602  VITAMINB12 --  FOLATE --  FERRITIN --  TIBC --  IRON --  RETICCTPCT 21.2*    SONOGRAPHER [name] ADMITTING [place], [name] [name], [name] REFERRING [place], [name] PERFORMING [name] ATTENDING [name] cc:  -------------------------------------------------------------------- Indications: Fever 780.6.  -------------------------------------------------------------------- History: PMH: Fever. No prior cardiac history. PMH: sickle cell anemia and crisis  -------------------------------------------------------------------- Study Conclusions  - Left ventricle: The cavity size was normal. Wall thickness was normal. Systolic function was normal. The estimated ejection fraction was in the range of 55% to 60%. Wall motion was normal; there were no regional wall motion abnormalities. Left ventricular diastolic function parameters were normal. -  Mitral valve: Trivial regurgitation. - Right atrium: The atrium was normal in size. Linear density seen in atrium appears to be a catheter. - Tricuspid valve: Mild regurgitation. - Pulmonary arteries: PA peak pressure: 26mm Hg (S). - Pericardium, extracardiac: There was no pericardial effusion. Transthoracic  echocardiography. M-mode, complete 2D, spectral Doppler, and color Doppler. Height: Height: 160cm. Height: 63in. Weight: Weight: 62.6kg. Weight: 137.7lb. Body mass index: BMI: 24.4kg/m^2. Body surface area: BSA: 1.51m^2. Patient status: Inpatient. Location: Bedside.     Micro: No results found for this or any previous visit (from the past 240 hour(s)).  Studies/Results: Dg Chest 2 View  04/27/2011  *RADIOLOGY REPORT*  Clinical Data: Cough, sickle cell disease, question pneumonia  CHEST - 2 VIEW  Comparison: 04/26/2011  Findings: Right jugular power port stable, catheter tip at cavoatrial junction. Normal heart size, mediastinal contours, and pulmonary vascularity. Minimal atelectasis right base. Lungs otherwise clear. No pleural effusion or pneumothorax. 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  IMPRESSION: Right basilar atelectasis. 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  Original Report Authenticated By: Lollie Marrow, M.D.   Dg Chest Portable 1 View  04/26/2011  *RADIOLOGY REPORT*  Clinical Data: Fever.  Sickle cell disease.  PORTABLE CHEST - 1 VIEW  Comparison: 03/19/2011  Findings: Heart size is normal.  Both lungs are clear.  No evidence of pleural effusion.  No mass or adenopathy identified.  Right- sided Port-A-Cath is noted.  IMPRESSION: No active cardiopulmonary disease.  Original Report Authenticated By: Danae Orleans, M.D.    Medications: I have reviewed the patient's current medications.  Assessment:  1. Febrile illness, likely secondary to sickle cell disease with crisis. Her urinalysis is without any evidence of infection. Her chest x-ray reveals no infiltrates. She will be maintained on broad-spectrum antibiotic treatment with Zosyn, Levaquin, and vancomycin. If she remains stable and afebrile over the next 24 hours, the antibiotics will be narrowed. Of note, her 2-D echocardiogram revealed preserved LV function and no evidence  of gross vegetations. Cultures are currently pending.  2 hepatic transaminitis, secondary to sickle cell disease with crisis. This will be followed.  3. 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 Her reticulocyte count is elevated and her hemoglobin has decreased some over the past 24 hours. Her hematocrit and hemoglobin will be monitored accordingly. She will be maintained on folic acid, hydroxyurea, when necessary ibuprofen, and IV hydromorphone. The IV fluids were changed to half-normal saline in the setting of sickle cell crisis.  Plan:  As above in the assessment. The IV fluids were changed to half-normal saline. Antibiotic therapy will be narrowed accordingly over the next 24-48 hours if she remains afebrile. Blood cultures which were ordered, will be reviewed.  LOS: 1 day   Saanvika Vazques 04/27/2011, 5:49 PM

## 2011-04-28 LAB — URINE CULTURE
Colony Count: NO GROWTH
Culture: NO GROWTH

## 2011-04-28 MED ORDER — SODIUM CHLORIDE 0.45 % IV SOLN
INTRAVENOUS | Status: DC
  Administered 2011-04-28: 18:00:00 via INTRAVENOUS

## 2011-04-28 MED ORDER — POLYETHYLENE GLYCOL 3350 17 G PO PACK
17.0000 g | PACK | Freq: Every day | ORAL | Status: DC
  Administered 2011-04-28 – 2011-04-29 (×2): via ORAL
  Filled 2011-04-28 (×2): qty 1

## 2011-04-28 NOTE — Consult Note (Signed)
ANTIBIOTIC CONSULT NOTE - INITIAL  Pharmacy Consult for vancomycin and zosyn Indication: rule out pneumonia  Patient Measurements: Height: 5\' 3"  (160 cm) Weight: 138 lb (62.596 kg) IBW/kg (Calculated) : 52.4   Vital Signs: Temp: 97.6 F (36.4 C) (08/04 0618) Temp src: Oral (08/04 0618) BP: 96/62 mmHg (08/04 0618) Pulse Rate: 66  (08/04 0618) Intake/Output from previous day: 08/03 0701 - 08/04 0700 In: 3165.4 [P.O.:480; I.V.:2235.4; IV Piggyback:450] Out: 3200 [Urine:3200]  Labs:  [facility] 04/27/11 0602 04/26/11 1900  WBC 9.9 13.1*  HGB 8.0* 8.8*  PLT 326 338  LABCREA -- --  CREATININE <0.47* <0.47*  CRCLEARANCE -- --   Microbiology: No results found for this or any previous visit (from the past 720 hour(s)).  Medical History: Past Medical History  Diagnosis Date  . Sickle cell disease   . Sickle cell disease, type S     Medications:  Scheduled:     . docusate sodium  100 mg Oral BID  . enoxaparin  40 mg Subcutaneous Q0600  . folic acid  1 mg Oral Daily  . hydroxyurea  1,000 mg Oral Custom  . hydroxyurea  500 mg Oral Custom  . levofloxacin (LEVAQUIN) IV  500 mg Intravenous Q24H  . piperacillin-tazobactam (ZOSYN)  IV  3.375 g Intravenous Q8H  . sodium chloride      . vancomycin  1,000 mg Intravenous Q12H  . DISCONTD: hydroxyurea  500-1,000 mg Oral UD  . DISCONTD: hydroxyurea  500-1,000 mg Oral UD   Assessment: Good renal fxn  Goal of Therapy:  Eradicate infection. Vancomycin trough level 15-20  Plan:  Follow up culture results Zosyn 3.375gm iv q8hrs Vancomycin 1gm q12hrs, check trough 8/5 am  Valrie Hart A 04/28/2011,9:24 AM

## 2011-04-28 NOTE — Progress Notes (Signed)
Subjective: The patient states that she feels a little better. Her pain is being managed well.  Objective: Vital signs in last 24 hours: Filed Vitals:   04/27/11 1808 04/27/11 2055 04/27/11 2230 04/28/11 0618  BP:   94/59 96/62  Pulse:   69 66  Temp: 98.5 F (36.9 C)  97.3 F (36.3 C) 97.6 F (36.4 C)  TempSrc:   Oral Oral  Resp:   20 18  Height:      Weight:      SpO2:  99% 100% 100%    Intake/Output Summary (Last 24 hours) at 04/28/11 1507 Last data filed at 04/28/11 0900  Gross per 24 hour  Intake 2835.42 ml  Output   2750 ml  Net  85.42 ml    Weight change:   Exam: General: The patient is currently sitting up in bed. She has no complaints of worsening pain. No complaints of nausea vomiting or diarrhea. HEENT: Head is normocephalic nontraumatic. Pupils are equal round reactive to light. Oropharynx reveals no evidence of exudates or erythema. Lungs: Decreased breath sounds in the bases otherwise clear. Heart: S1-S2 with a 1-2/6 systolic murmur. Abdomen: Positive bowel sounds, mildly tender in the hypogastric area, non distended, no masses palpated. Extremities/musculoskeletal: Mildly tender over her lower extremities bilaterally. Mild tenderness over the lumbosacral muscles. There is no evidence of acute edema, erythema, or acute hot red joints.   Lab Results: Basic Metabolic Panel:  Basename 04/27/11 0602 04/26/11 1900  NA 137 137  K 3.6 3.9  CL 104 102  CO2 21 20  GLUCOSE 98 89  BUN 5* 5*  CREATININE <0.47* <0.47*  CALCIUM 8.4 8.9  MG -- --  PHOS -- --   Liver Function Tests:  [facility] 04/27/11 0602  AST 123*  ALT 57*  ALKPHOS 144*  BILITOT 10.1*  PROT 7.0  ALBUMIN 3.0*   No results found for this basename: LIPASE:2,AMYLASE:2 in the last 72 hours CBC:  Basename 04/27/11 0602 04/26/11 1900  WBC 9.9 13.1*  NEUTROABS -- 11.9*  HGB 8.0* 8.8*  HCT 22.3* 24.4*  MCV 100.0 98.8  PLT 326 338   Cardiac Enzymes: No results found for this basename:  CKTOTAL:3,CKMB:3,CKMBINDEX:3,TROPONINI:3 in the last 72 hours BNP: No results found for this basename: POCBNP:3 in the last 72 hours D-Dimer: No results found for this basename: DDIMER:2 in the last 72 hours CBG: No results found for this basename: GLUCAP:6 in the last 72 hours Hemoglobin A1C: No results found for this basename: HGBA1C in the last 72 hours Fasting Lipid Panel: No results found for this basename: CHOL,HDL,LDLCALC,TRIG,CHOLHDL,LDLDIRECT in the last 72 hours Thyroid Function Tests:  Basename 04/27/11 0602  TSH 1.196  T4TOTAL --  FREET4 --  T3FREE --  THYROIDAB --   Anemia Panel:  Basename 04/27/11 0602  VITAMINB12 --  FOLATE --  FERRITIN --  TIBC --  IRON --  RETICCTPCT 21.2*    SONOGRAPHER [name] ADMITTING [place], [name] [name], [name] REFERRING [name], [name] PERFORMING [name] ATTENDING [name] cc:  -------------------------------------------------------------------- Indications: Fever 780.6.  -------------------------------------------------------------------- History: PMH: Fever. No prior cardiac history. PMH: sickle cell anemia and crisis  -------------------------------------------------------------------- Study Conclusions  - Left ventricle: The cavity size was normal. Wall thickness was normal. Systolic function was normal. The estimated ejection fraction was in the range of 55% to 60%. Wall motion was normal; there were no regional wall motion abnormalities. Left ventricular diastolic function parameters were normal. - Mitral valve: Trivial regurgitation. - Right atrium: The  atrium was normal in size. Linear density seen in atrium appears to be a catheter. - Tricuspid valve: Mild regurgitation. - Pulmonary arteries: PA peak pressure: 26mm Hg (S). - Pericardium, extracardiac: There was no pericardial effusion. Transthoracic echocardiography. M-mode, complete 2D, spectral Doppler, and color  Doppler. Height: Height: 160cm. Height: 63in. Weight: Weight: 62.6kg. Weight: 137.7lb. Body mass index: BMI: 24.4kg/m^2. Body surface area: BSA: 1.19m^2. Patient status: Inpatient. Location: Bedside.     Micro: Recent Results (from the past 240 hour(s))  URINE CULTURE     Status: Normal   Collection Time   04/27/11  2:06 AM      Component Value Range Status Comment   Specimen Description URINE, CLEAN CATCH   Final    Special Requests NONE   Final    Setup Time [id number]   Final    Colony Count NO GROWTH   Final    Culture NO GROWTH   Final    Report Status 04/28/2011 FINAL   Final     Studies/Results: Dg Chest 2 View  04/27/2011  *RADIOLOGY REPORT*  Clinical Data: Cough, sickle cell disease, question pneumonia  CHEST - 2 VIEW  Comparison: 04/26/2011  Findings: Right jugular power port stable, catheter tip at cavoatrial junction. Normal heart size, mediastinal contours, and pulmonary vascularity. Minimal atelectasis right base. Lungs otherwise clear. No pleural effusion or pneumothorax. 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  IMPRESSION: Right basilar atelectasis. 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  Original Report Authenticated By: Lollie Marrow, M.D.   Dg Chest Portable 1 View  04/26/2011  *RADIOLOGY REPORT*  Clinical Data: Fever.  Sickle cell disease.  PORTABLE CHEST - 1 VIEW  Comparison: 03/19/2011  Findings: Heart size is normal.  Both lungs are clear.  No evidence of pleural effusion.  No mass or adenopathy identified.  Right- sided Port-A-Cath is noted.  IMPRESSION: No active cardiopulmonary disease.  Original Report Authenticated By: Danae Orleans, M.D.    Medications: I have reviewed the patient's current medications.  Assessment:  1. Febrile illness, likely secondary to sickle cell disease with crisis. Her urinalysis is without any evidence of infection. Her chest x-ray reveals no infiltrates. She is on broad-spectrum  antibiotic treatment with Zosyn, Levaquin, and vancomycin.  Of note, her 2-D echocardiogram revealed preserved LV function and no evidence of gross vegetations. Cultures are currently pending.  2. Hepatic transaminitis, secondary to sickle cell disease with crisis. This will be followed.  3. 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 Her reticulocyte count is elevated and her hemoglobin has decreased some. No new labs ordered today. Her hematocrit and hemoglobin will be monitored accordingly. She will be maintained on folic acid, hydroxyurea, when necessary ibuprofen, and IV hydromorphone.   Plan:  Will decrease the IV fluids to 75 cc an hour. Discontinue Levaquin. We will consider narrowing antibiotic therapy even further accordingly. We'll order laboratory studies in the morning.  Patient was encouraged to ambulate today.  LOS: 2 days   Dawn [name] 04/28/2011, 3:07 PM

## 2011-04-29 LAB — BASIC METABOLIC PANEL
BUN: 5 mg/dL — ABNORMAL LOW (ref 6–23)
Calcium: 9 mg/dL (ref 8.4–10.5)
Glucose, Bld: 87 mg/dL (ref 70–99)
Sodium: 138 mEq/L (ref 135–145)

## 2011-04-29 LAB — CBC
Hemoglobin: 6.8 g/dL — CL (ref 12.0–15.0)
MCH: 35.6 pg — ABNORMAL HIGH (ref 26.0–34.0)
MCHC: 36.6 g/dL — ABNORMAL HIGH (ref 30.0–36.0)
RDW: 24.3 % — ABNORMAL HIGH (ref 11.5–15.5)

## 2011-04-29 LAB — HEPATIC FUNCTION PANEL
Alkaline Phosphatase: 140 U/L — ABNORMAL HIGH (ref 39–117)
Indirect Bilirubin: 3.3 mg/dL — ABNORMAL HIGH (ref 0.3–0.9)
Total Bilirubin: 4.4 mg/dL — ABNORMAL HIGH (ref 0.3–1.2)
Total Protein: 7.5 g/dL (ref 6.0–8.3)

## 2011-04-29 LAB — RETICULOCYTES: Retic Ct Pct: 18.5 % — ABNORMAL HIGH (ref 0.4–3.1)

## 2011-04-29 MED ORDER — VANCOMYCIN HCL 1000 MG IV SOLR
1250.0000 mg | Freq: Two times a day (BID) | INTRAVENOUS | Status: DC
  Filled 2011-04-29 (×2): qty 1250

## 2011-04-29 NOTE — Progress Notes (Signed)
Subjective: Pain improving.  Cough resolved.  No F/C/N/V/D.  Constipated.  Objective: Vital signs in last 24 hours: Filed Vitals:   04/28/11 1400 04/28/11 2005 04/28/11 2023 04/29/11 0538  BP: 90/54  113/74 97/58  Pulse: 85  93 91  Temp: 98.4 F (36.9 C)  98.7 F (37.1 C) 98.8 F (37.1 C)  TempSrc: Oral  Oral   Resp: 18  20 20   Height:      Weight:      SpO2: 92% 98% 96% 94%   Weight change:   Intake/Output Summary (Last 24 hours) at 04/29/11 1416 Last data filed at 04/29/11 0900  Gross per 24 hour  Intake   1250 ml  Output   2000 ml  Net   -750 ml   General appearance: alert, no distress and watching TV Lungs: clear to auscultation bilaterally Heart: regular rate and rhythm, S1, S2 normal, no murmur, click, rub or gallop Abdomen: soft, non-tender; bowel sounds normal; no masses,  no organomegaly Extremities: edema trace Lab Results: Basic Metabolic Panel:  Basename 04/29/11 0750 04/27/11 0602  NA 138 137  K 3.7 3.6  CL 102 104  CO2 26 21  GLUCOSE 87 98  BUN 5* 5*  CREATININE <0.47* <0.47*  CALCIUM 9.0 8.4  MG -- --  PHOS -- --   Liver Function Tests:  [facility] 04/29/11 0750 04/27/11 0602  AST 66* 123*  ALT 42* 57*  ALKPHOS 140* 144*  BILITOT 4.4* 10.1*  PROT 7.5 7.0  ALBUMIN 3.3* 3.0*   CBC:  Basename 04/29/11 0750 04/27/11 0602 04/26/11 1900  WBC 5.1 9.9 --  NEUTROABS -- -- 11.9*  HGB 6.8* 8.0* --  HCT 18.6* 22.3* --  MCV 97.4 100.0 --  PLT 237 326 --   Thyroid Function Tests:  Basename 04/27/11 0602  TSH 1.196  T4TOTAL --  FREET4 --  T3FREE --  THYROIDAB --   Anemia Panel:  Basename 04/29/11 0750  VITAMINB12 --  FOLATE --  FERRITIN --  TIBC --  IRON --  RETICCTPCT 18.5*    Micro Results: Recent Results (from the past 240 hour(s))  CULTURE, BLOOD (ROUTINE X 2)     Status: Normal (Preliminary result)   Collection Time   04/26/11  6:45 PM      Component Value Range Status Comment   Specimen Description BLOOD LEFT HAND   Final     Special Requests     Final    Value: BOTTLES DRAWN AEROBIC AND ANAEROBIC AEB=8CC ANA=6CC   Culture NO GROWTH 3 DAYS   Final    Report Status PENDING   Incomplete   CULTURE, BLOOD (ROUTINE X 2)     Status: Normal (Preliminary result)   Collection Time   04/26/11  7:00 PM      Component Value Range Status Comment   Specimen Description PORTA CATH DRAWN BY RN   Final    Special Requests BOTTLES DRAWN AEROBIC AND ANAEROBIC 7CC EACH   Final    Culture NO GROWTH 3 DAYS   Final    Report Status PENDING   Incomplete   URINE CULTURE     Status: Normal   Collection Time   04/27/11  2:06 AM      Component Value Range Status Comment   Specimen Description URINE, CLEAN CATCH   Final    Special Requests NONE   Final    Setup Time [id number]   Final    Colony Count NO GROWTH   Final  Culture NO GROWTH   Final    Report Status 04/28/2011 FINAL   Final    Studies/Results: No results found.  Scheduled Meds:   . docusate sodium  100 mg Oral BID  . enoxaparin  40 mg Subcutaneous Q0600  . folic acid  1 mg Oral Daily  . hydroxyurea  1,000 mg Oral Custom  . hydroxyurea  500 mg Oral Custom  . piperacillin-tazobactam (ZOSYN)  IV  3.375 g Intravenous Q8H  . polyethylene glycol  17 g Oral Daily  . vancomycin  1,250 mg Intravenous Q12H  . vancomycin  1,000 mg Intravenous Q12H  . DISCONTD: levofloxacin (LEVAQUIN) IV  500 mg Intravenous Q24H   Continuous Infusions:   . sodium chloride 75 mL/hr at 04/28/11 1804  . DISCONTD: 0.45 % NaCl with KCl 20 mEq / [name] 125 mL/hr at 04/28/11 0458   PRN Meds:.HYDROmorphone, ibuprofen, levalbuterol, ondansetron (ZOFRAN) IV, ondansetron, oxyCODONE, promethazine, promethazine, senna  Assessment/Plan: Patient Active Hospital Problem List: Fever (04/26/2011): none for over 72 hours.  W/u negative.  Suspect viral illness.  D/C abx and monitor Sickle cell crisis (04/26/2011):  Improving pain.  Hep lock.  Increase activity Elevated LFTs (04/27/2011):  Secondary to  SSD Sickle cell anemia (04/27/2011):  No transfusion unless symptomatic. D/C tele   LOS: 3 days   Dawn [name] 04/29/2011, 2:16 PM

## 2011-04-29 NOTE — Consult Note (Signed)
ANTIBIOTIC CONSULT NOTE   Pharmacy Consult for vancomycin and zosyn Indication: rule out pneumonia, febrile illness  Patient Measurements: Height: 5\' 3"  (160 cm) Weight: 138 lb (62.596 kg) IBW/kg (Calculated) : 52.4   Vital Signs: Temp: 98.8 F (37.1 C) (08/05 0538) BP: 97/58 mmHg (08/05 0538) Pulse Rate: 91  (08/05 0538) Intake/Output from previous day: 08/04 0701 - 08/05 0700 In: 1580 [P.O.:510; I.V.:820; IV Piggyback:250] Out: 2450 [Urine:2450]  Labs: RECENT VANCOMYCIN TROUGH LEVELS: Recent Labs  Basename 04/29/11 0750   VANCOTROUGH 7.8*     Basename 04/29/11 0750 04/27/11 0602 04/26/11 1900  WBC 5.1 9.9 13.1*  HGB 6.8* 8.0* 8.8*  PLT 237 326 338  LABCREA -- -- --  CREATININE <0.47* <0.47* <0.47*  CRCLEARANCE -- -- --   Microbiology: Recent Results (from the past 720 hour(s))  CULTURE, BLOOD (ROUTINE X 2)     Status: Normal (Preliminary result)   Collection Time   04/26/11  6:45 PM      Component Value Range Status Comment   Specimen Description BLOOD LEFT HAND   Final    Special Requests     Final    Value: BOTTLES DRAWN AEROBIC AND ANAEROBIC AEB=8CC ANA=6CC   Culture NO GROWTH 3 DAYS   Final    Report Status PENDING   Incomplete   CULTURE, BLOOD (ROUTINE X 2)     Status: Normal (Preliminary result)   Collection Time   04/26/11  7:00 PM      Component Value Range Status Comment   Specimen Description PORTA CATH DRAWN BY RN   Final    Special Requests BOTTLES DRAWN AEROBIC AND ANAEROBIC 7CC EACH   Final    Culture NO GROWTH 3 DAYS   Final    Report Status PENDING   Incomplete   URINE CULTURE     Status: Normal   Collection Time   04/27/11  2:06 AM      Component Value Range Status Comment   Specimen Description URINE, CLEAN CATCH   Final    Special Requests NONE   Final    Setup Time [id number]   Final    Colony Count NO GROWTH   Final    Culture NO GROWTH   Final    Report Status 04/28/2011 FINAL   Final    Medical History: Past Medical History    Diagnosis Date  . Sickle cell disease   . Sickle cell disease, type S    Medications:  Scheduled:     . docusate sodium  100 mg Oral BID  . enoxaparin  40 mg Subcutaneous Q0600  . folic acid  1 mg Oral Daily  . hydroxyurea  1,000 mg Oral Custom  . hydroxyurea  500 mg Oral Custom  . piperacillin-tazobactam (ZOSYN)  IV  3.375 g Intravenous Q8H  . polyethylene glycol  17 g Oral Daily  . vancomycin  1,250 mg Intravenous Q12H  . vancomycin  1,000 mg Intravenous Q12H  . DISCONTD: levofloxacin (LEVAQUIN) IV  500 mg Intravenous Q24H   Assessment: Good renal fxn, SCr stable, Excellent vanco clearance.  Goal of Therapy:  Eradicate infection. Vancomycin trough level 15-20  Plan:  Follow up culture results Continue Zosyn 3.375gm iv q8hrs Increase Vancomycin to 1250mg  q12hrs, re-check trough at steady state.  Duration of ABX Rx per MD.  Valrie Hart A 04/29/2011,10:28 AM

## 2011-04-30 LAB — CBC
MCH: 35.5 pg — ABNORMAL HIGH (ref 26.0–34.0)
MCV: 97.3 fL (ref 78.0–100.0)
Platelets: 249 10*3/uL (ref 150–400)
RDW: 24.7 % — ABNORMAL HIGH (ref 11.5–15.5)

## 2011-04-30 MED ORDER — SODIUM CHLORIDE 0.9 % IJ SOLN
INTRAMUSCULAR | Status: AC
  Administered 2011-04-30: 10 mL
  Filled 2011-04-30: qty 10

## 2011-04-30 MED ORDER — IBUPROFEN 400 MG PO TABS: 400.0000 mg | ORAL_TABLET | Freq: Three times a day (TID) | ORAL | Status: AC | PRN

## 2011-04-30 MED ORDER — HEPARIN SOD (PORK) LOCK FLUSH 100 UNIT/ML IV SOLN
500.0000 [IU] | Freq: Once | INTRAVENOUS | Status: AC
  Administered 2011-04-30: 500 [IU] via INTRAVENOUS
  Filled 2011-04-30: qty 5

## 2011-04-30 MED ORDER — HEPARIN (PORCINE) LOCK FLUSH 10 UNIT/ML IV SOLN: 10.0000 [IU] | Freq: Once | INTRAVENOUS | Status: DC

## 2011-04-30 NOTE — Discharge Summary (Signed)
Physician Discharge Summary  Patient ID: Dawn [name] MRN: [id number] DOB/AGE: [birth year] 30 y.o.  Admit date: 04/26/2011 Discharge date: 04/30/2011  Discharge Diagnoses:  Active Problems:  Sickle cell crisis  Elevated LFTs  Sickle cell anemia   Current Discharge Medication List    START taking these medications   Details  ibuprofen (ADVIL,MOTRIN) 400 MG tablet Take 1 tablet (400 mg total) by mouth 3 (three) times daily as needed for fever. Qty: 1 tablet, Refills: 0      CONTINUE these medications which have NOT CHANGED   Details  folic acid (FOLVITE) 1 MG tablet Take 1 mg by mouth daily.      hydroxyurea (HYDREA) 500 MG capsule Take 500-1,000 mg by mouth as directed. Take 500mg  (1 tablet) po on all ODD days (Mondays, Wednesdays, Fridays, Sundays) and take 1000mg  (2 Tablets) on all EVEN days (Tuesdays, Thursdays, Saturdays).  May take with food to minimize GI side effects.    oxycodone (OXY-IR) 5 MG capsule Take 10 mg by mouth every 4 (four) hours as needed. For pain         Follow-up Information    Follow up with [facility] in 2 weeks.         Disposition: Home or Self Care  Discharged Condition: stable  Consults:  none  Labs:   Results for orders placed during the hospital encounter of 04/26/11 (from the past 48 hour(s))  CBC     Status: Abnormal   Collection Time   04/29/11  7:50 AM      Component Value Range Comment   WBC 5.1  4.0 - 10.5 (K/uL)    RBC 1.91 (*) 3.87 - 5.11 (MIL/uL)    Hemoglobin 6.8 (*) 12.0 - 15.0 (g/dL)    HCT 21.3 (*) 08.6 - 46.0 (%)    MCV 97.4  78.0 - 100.0 (fL)    MCH 35.6 (*) 26.0 - 34.0 (pg)    MCHC 36.6 (*) 30.0 - 36.0 (g/dL)    RDW 57.8 (*) 46.9 - 15.5 (%)    Platelets 237  150 - 400 (K/uL)   HEPATIC FUNCTION PANEL     Status: Abnormal   Collection Time   04/29/11  7:50 AM      Component Value Range Comment   Total Protein 7.5  6.0 - 8.3 (g/dL)    Albumin 3.3 (*) 3.5 - 5.2 (g/dL)    AST 66 (*) 0 - 37 (U/L)    ALT 42 (*) 0 - 35  (U/L)    Alkaline Phosphatase 140 (*) 39 - 117 (U/L)    Total Bilirubin 4.4 (*) 0.3 - 1.2 (mg/dL)    Bilirubin, Direct 1.1 (*) 0.0 - 0.3 (mg/dL)    Indirect Bilirubin 3.3 (*) 0.3 - 0.9 (mg/dL)   RETICULOCYTES     Status: Abnormal   Collection Time   04/29/11  7:50 AM      Component Value Range Comment   Retic Ct Pct 18.5 (*) 0.4 - 3.1 (%)    RBC. 1.91 (*) 3.87 - 5.11 (MIL/uL)    Retic Count, Manual 353.4 (*) 19.0 - 186.0 (K/uL)   BASIC METABOLIC PANEL     Status: Abnormal   Collection Time   04/29/11  7:50 AM      Component Value Range Comment   Sodium 138  135 - 145 (mEq/L)    Potassium 3.7  3.5 - 5.1 (mEq/L)    Chloride 102  96 - 112 (mEq/L)    CO2 26  19 - 32 (mEq/L)    Glucose, Bld 87  70 - 99 (mg/dL)    BUN 5 (*) 6 - 23 (mg/dL)    Creatinine, Ser <9.14 (*) 0.50 - 1.10 (mg/dL)    Calcium 9.0  8.4 - 10.5 (mg/dL)    GFR calc non Af Amer NOT CALCULATED  >60 (mL/min)    GFR calc Af Amer NOT CALCULATED  >60 (mL/min)   VANCOMYCIN, TROUGH     Status: Abnormal   Collection Time   04/29/11  7:50 AM      Component Value Range Comment   Vancomycin Tr 7.8 (*) 10.0 - 20.0 (ug/mL)   CBC     Status: Abnormal   Collection Time   04/30/11  5:00 AM      Component Value Range Comment   WBC 7.9  4.0 - 10.5 (K/uL)    RBC 1.86 (*) 3.87 - 5.11 (MIL/uL)    Hemoglobin 6.6 (*) 12.0 - 15.0 (g/dL)    HCT 78.2 (*) 95.6 - 46.0 (%)    MCV 97.3  78.0 - 100.0 (fL)    MCH 35.5 (*) 26.0 - 34.0 (pg)    MCHC 36.5 (*) 30.0 - 36.0 (g/dL)    RDW 21.3 (*) 08.6 - 15.5 (%)    Platelets 249  150 - 400 (K/uL)     Diagnostics:  Dg Chest 2 View  04/27/2011  *RADIOLOGY REPORT*  Clinical Data: Cough, sickle cell disease, question pneumonia  CHEST - 2 VIEW  Comparison: 04/26/2011  Findings: Right jugular power port stable, catheter tip at cavoatrial junction. Normal heart size, mediastinal contours, and pulmonary vascularity. Minimal atelectasis right base. Lungs otherwise clear. No pleural effusion or pneumothorax. Old  avascular necrosis of left humeral head with partial collapse.  IMPRESSION: Right basilar atelectasis. 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  Original Report Authenticated By: Lollie Marrow, M.D.   Dg Chest Portable 1 View  04/26/2011  *RADIOLOGY REPORT*  Clinical Data: Fever.  Sickle cell disease.  PORTABLE CHEST - 1 VIEW  Comparison: 03/19/2011  Findings: Heart size is normal.  Both lungs are clear.  No evidence of pleural effusion.  No mass or adenopathy identified.  Right- sided Port-A-Cath is noted.  IMPRESSION: No active cardiopulmonary disease.  Original Report Authenticated By: Danae Orleans, M.D.   Full Code   Hospital Course:  The patient is a 30 year old black female with history of sickle cell disease who presents with fever and pain crisis. Please see H&P for details. She has had multiple hospitalizations within the past year for crises. Her temperature was about 101. Her son had an upper respiratory infection. She had a negative chest x-ray blood cultures urinalysis and urine culture. She was admitted and started on hypotonic IV fluid pain medication and supportive care. She was started on empiric IV antibiotics. She has been afebrile for the lap last 3-4 days. She has developed no symptoms of infection. Her pain is currently well controlled and she feels that she can manage at home. She is chronically elevated liver function tests and reports that she may be started on iron chelation therapy at some point in the future. She is tolerating a diet. She is chronically anemic and at discharge her hemoglobin was 6.6. She is asymptomatic and does not require transfusion at this time. She reports compliance with her home medications. She is followed by a sickle cell specialist at an outside hospital.  Discharge Exam: Blood pressure 98/59, pulse 89, temperature 98.5  F (36.9 C), temperature source Oral, resp. rate 16, height 5\' 3"  (1.6 m), weight 62.596 kg (138 lb), last  menstrual period 04/09/2011, SpO2 93.00%. Unchanged from 04/29/11   Signed: Crista Curb L 04/30/2011, 12:05 PM

## 2011-05-01 LAB — CULTURE, BLOOD (ROUTINE X 2)

## 2011-05-04 NOTE — Progress Notes (Signed)
Encounter addended by: Ree Shay, RN on: 05/04/2011 12:53 PM<BR>     Documentation filed: Charges VN

## 2011-05-24 NOTE — Progress Notes (Signed)
Encounter addended by: Clarene Critchley on: 05/24/2011  8:26 AM<BR>     Documentation filed: Flowsheet VN

## 2011-05-26 ENCOUNTER — Emergency Department (HOSPITAL_COMMUNITY): Payer: Medicare Other

## 2011-05-26 ENCOUNTER — Inpatient Hospital Stay (HOSPITAL_COMMUNITY)
Admission: EM | Admit: 2011-05-26 | Discharge: 2011-06-05 | DRG: 812 | Disposition: A | Discharge status: Home / Self Care | Payer: Medicare Other | Attending: Internal Medicine | Admitting: Internal Medicine

## 2011-05-26 ENCOUNTER — Encounter (HOSPITAL_COMMUNITY): Payer: Self-pay | Admitting: *Deleted

## 2011-05-26 DIAGNOSIS — R7989 Other specified abnormal findings of blood chemistry: Secondary | ICD-10-CM

## 2011-05-26 DIAGNOSIS — I10 Essential (primary) hypertension: Secondary | ICD-10-CM

## 2011-05-26 DIAGNOSIS — D571 Sickle-cell disease without crisis: Secondary | ICD-10-CM

## 2011-05-26 DIAGNOSIS — R509 Fever, unspecified: Secondary | ICD-10-CM

## 2011-05-26 DIAGNOSIS — R5081 Fever presenting with conditions classified elsewhere: Secondary | ICD-10-CM | POA: Diagnosis present

## 2011-05-26 DIAGNOSIS — D5700 Hb-SS disease with crisis, unspecified: Secondary | ICD-10-CM | POA: Diagnosis present

## 2011-05-26 DIAGNOSIS — D72829 Elevated white blood cell count, unspecified: Secondary | ICD-10-CM

## 2011-05-26 HISTORY — DX: Encounter for other specified aftercare: Z51.89

## 2011-05-26 HISTORY — DX: Reserved for inherently not codable concepts without codable children: IMO0001

## 2011-05-26 LAB — DIFFERENTIAL
Eosinophils Absolute: 0 10*3/uL (ref 0.0–0.7)
Eosinophils Relative: 0 % (ref 0–5)
Lymphocytes Relative: 16 % (ref 12–46)
Lymphs Abs: 2.6 10*3/uL (ref 0.7–4.0)
Monocytes Absolute: 2.1 10*3/uL — ABNORMAL HIGH (ref 0.1–1.0)
Monocytes Relative: 12 % (ref 3–12)

## 2011-05-26 LAB — URINALYSIS, ROUTINE W REFLEX MICROSCOPIC
Glucose, UA: NEGATIVE mg/dL
Ketones, ur: NEGATIVE mg/dL
Leukocytes, UA: NEGATIVE
Nitrite: NEGATIVE
Protein, ur: 100 mg/dL — AB
pH: 6 (ref 5.0–8.0)

## 2011-05-26 LAB — CBC
HCT: 20.8 % — ABNORMAL LOW (ref 36.0–46.0)
Hemoglobin: 7.7 g/dL — ABNORMAL LOW (ref 12.0–15.0)
MCH: 36.2 pg — ABNORMAL HIGH (ref 26.0–34.0)
MCV: 97.7 fL (ref 78.0–100.0)
Platelets: 215 10*3/uL (ref 150–400)
RBC: 2.13 MIL/uL — ABNORMAL LOW (ref 3.87–5.11)
WBC: 16.7 10*3/uL — ABNORMAL HIGH (ref 4.0–10.5)

## 2011-05-26 LAB — RETICULOCYTES: RBC.: 2.13 MIL/uL — ABNORMAL LOW (ref 3.87–5.11)

## 2011-05-26 LAB — COMPREHENSIVE METABOLIC PANEL
AST: 86 U/L — ABNORMAL HIGH (ref 0–37)
BUN: 5 mg/dL — ABNORMAL LOW (ref 6–23)
CO2: 25 mEq/L (ref 19–32)
Calcium: 8.4 mg/dL (ref 8.4–10.5)
Chloride: 102 mEq/L (ref 96–112)
Creatinine, Ser: <0.47 mg/dL — ABNORMAL LOW (ref 0.50–1.10)
Glucose, Bld: 90 mg/dL (ref 70–99)
Total Bilirubin: 6.4 mg/dL — ABNORMAL HIGH (ref 0.3–1.2)

## 2011-05-26 LAB — URINE MICROSCOPIC-ADD ON

## 2011-05-26 MED ORDER — HEPARIN SOD (PORK) LOCK FLUSH 100 UNIT/ML IV SOLN
500.0000 [IU] | Freq: Once | INTRAVENOUS | Status: AC
  Administered 2011-05-26: 500 [IU] via INTRAVENOUS
  Filled 2011-05-26: qty 5

## 2011-05-26 MED ORDER — SODIUM CHLORIDE 0.9 % IV SOLN
INTRAVENOUS | Status: DC
  Administered 2011-05-26: 13:00:00 via INTRAVENOUS

## 2011-05-26 MED ORDER — ALPRAZOLAM 0.5 MG PO TABS
0.5000 mg | ORAL_TABLET | Freq: Three times a day (TID) | ORAL | Status: DC | PRN
  Administered 2011-05-27 – 2011-06-02 (×4): 0.5 mg via ORAL
  Filled 2011-05-26 (×4): qty 1

## 2011-05-26 MED ORDER — ACETAMINOPHEN 500 MG PO TABS
1000.0000 mg | ORAL_TABLET | Freq: Once | ORAL | Status: AC
  Administered 2011-05-26: 1000 mg via ORAL
  Filled 2011-05-26: qty 1

## 2011-05-26 MED ORDER — SODIUM CHLORIDE 0.9 % IV SOLN
INTRAVENOUS | Status: DC
  Administered 2011-05-26 – 2011-05-27 (×4): via INTRAVENOUS
  Administered 2011-05-28 (×2): 125 mL via INTRAVENOUS
  Administered 2011-05-28: 01:00:00 via INTRAVENOUS
  Administered 2011-05-29: 125 mL via INTRAVENOUS
  Administered 2011-05-29 – 2011-06-02 (×9): via INTRAVENOUS

## 2011-05-26 MED ORDER — OXYCODONE HCL 5 MG PO TABS
5.0000 mg | ORAL_TABLET | ORAL | Status: DC | PRN
  Administered 2011-05-26 – 2011-05-28 (×10): 5 mg via ORAL
  Filled 2011-05-26 (×16): qty 1

## 2011-05-26 MED ORDER — ONDANSETRON HCL 4 MG/2ML IJ SOLN
4.0000 mg | Freq: Four times a day (QID) | INTRAMUSCULAR | Status: DC | PRN
  Administered 2011-05-29 – 2011-05-31 (×2): 4 mg via INTRAVENOUS
  Filled 2011-05-26 (×2): qty 2

## 2011-05-26 MED ORDER — ONDANSETRON HCL 4 MG PO TABS: 4.0000 mg | ORAL_TABLET | Freq: Four times a day (QID) | ORAL | Status: DC | PRN

## 2011-05-26 MED ORDER — ONDANSETRON HCL 4 MG/2ML IJ SOLN
4.0000 mg | Freq: Once | INTRAMUSCULAR | Status: AC
  Administered 2011-05-26: 4 mg via INTRAVENOUS
  Filled 2011-05-26: qty 2

## 2011-05-26 MED ORDER — LORATADINE 10 MG PO TABS
10.0000 mg | ORAL_TABLET | Freq: Every day | ORAL | Status: DC
  Administered 2011-05-26 – 2011-06-05 (×11): 10 mg via ORAL
  Filled 2011-05-26 (×11): qty 1

## 2011-05-26 MED ORDER — FOLIC ACID 1 MG PO TABS
1.0000 mg | ORAL_TABLET | Freq: Every day | ORAL | Status: DC
  Administered 2011-05-26 – 2011-06-05 (×11): 1 mg via ORAL
  Filled 2011-05-26 (×11): qty 1

## 2011-05-26 MED ORDER — SODIUM CHLORIDE 0.9 % IV BOLUS (SEPSIS)
1000.0000 mL | Freq: Once | INTRAVENOUS | Status: AC
  Administered 2011-05-26: 1000 mL via INTRAVENOUS

## 2011-05-26 MED ORDER — HYDROMORPHONE HCL 1 MG/ML IJ SOLN
1.0000 mg | Freq: Once | INTRAMUSCULAR | Status: AC
  Administered 2011-05-26: 1 mg via INTRAVENOUS
  Filled 2011-05-26: qty 1

## 2011-05-26 MED ORDER — HYDROXYUREA 500 MG PO CAPS: 500.0000 mg | ORAL_CAPSULE | ORAL | Status: DC

## 2011-05-26 MED ORDER — LOSARTAN POTASSIUM 50 MG PO TABS
25.0000 mg | ORAL_TABLET | Freq: Every day | ORAL | Status: DC
  Administered 2011-05-26 – 2011-06-05 (×10): 25 mg via ORAL
  Filled 2011-05-26 (×11): qty 1

## 2011-05-26 MED ORDER — HYDROMORPHONE HCL 1 MG/ML IJ SOLN
1.0000 mg | INTRAMUSCULAR | Status: DC | PRN
  Administered 2011-05-26 – 2011-05-27 (×10): 1 mg via INTRAVENOUS
  Filled 2011-05-26 (×10): qty 1

## 2011-05-26 NOTE — ED Notes (Signed)
Pt has power port in right anterior chest

## 2011-05-26 NOTE — ED Notes (Signed)
Pt c/o sickle cell pain that started yesterday and has gotten worse. Pt states she has tried to control her pain at home and was not effective

## 2011-05-26 NOTE — ED Notes (Signed)
Pt states meds have a little. States she thinks she can tolerate x-ray

## 2011-05-26 NOTE — Progress Notes (Signed)
Patient arrived on unit from ED. Moderate amount of pain gave medication and hot pack per patient request. OOB to bathroom @ this time. Tolerates well with some pain.

## 2011-05-26 NOTE — H&P (Signed)
PCP:   No primary provider on file.   Chief Complaint:  Pain all over, particularly hips and thighs.  HPI: [name] is a very pleasant 30 year old African American woman with a history of sickle cell anemia who is well-known to [place] for her repeated hospitalizations this year for sickle cell crises. She says that pain began around her hip area at about 9:00 yesterday evening. She says that she tried to stay  at home and in fact programmed an alarm on her cell phone to remember to take her OxyIR every 4 hours. Because of continued pain she decided to come to the hospital this morning.   Allergies:   Allergies  Allergen Reactions  . Latex Other (See Comments)    REACTION: Pt experiences a burning sensation on contacted skin areas  . Lisinopril Other (See Comments) and Cough    REACTION: Sore/scratchy throat  . Tape Other (See Comments)    REACTION: Pt. Experiences a burning sensation on contacted skin areas  . Tylenol (Acetaminophen) Other (See Comments)    Pt. Does not take the following due to protection of kidney health. Past occurrence of Protein in urine.      Past Medical History  Diagnosis Date  . Sickle cell disease   . Sickle cell disease, type S     Past Surgical History  Procedure Date  . Cholecystectomy   .  left knee acl reconstruction   . Cesarean section     x 2  . Portacath placement     Prior to Admission medications   Medication Sig Start Date End Date Taking? Authorizing Provider  cetirizine (ZYRTEC) 10 MG tablet Take 10 mg by mouth daily as needed. For allergies    Yes Historical Provider, MD  folic acid (FOLVITE) 1 MG tablet Take 1 mg by mouth daily.     Yes Historical Provider, MD  hydroxyurea (HYDREA) 500 MG capsule Take 500-1,000 mg by mouth as directed. Take 500mg  (1 tablet) po on all ODD days (Mondays, Wednesdays, Fridays, Sundays) and take 1000mg  (2 Tablets) on all EVEN days (Tuesdays, Thursdays, Saturdays).  May take with food to minimize GI side  effects.   Yes Historical Provider, MD  losartan (COZAAR) 25 MG tablet Take 25 mg by mouth daily.     Yes Historical Provider, MD  oxycodone (OXY-IR) 5 MG capsule Take 10 mg by mouth every 4 (four) hours as needed. For pain    Yes Historical Provider, MD    Social History:  reports that she has never smoked. She does not have any smokeless tobacco history on file. She reports that she does not drink alcohol or use illicit drugs.  No family history on file.  Review of Systems:  Constitutional: Denies chills, diaphoresis, appetite change  HEENT: Denies photophobia, eye pain, redness, hearing loss, ear pain, congestion, sore throat, rhinorrhea, sneezing, mouth sores, trouble swallowing, neck pain, neck stiffness and tinnitus.   Respiratory: Denies SOB, DOE, cough, chest tightness,  and wheezing.   Cardiovascular: Denies chest pain, palpitations and leg swelling.  Gastrointestinal: Denies nausea, vomiting, abdominal pain, diarrhea, constipation, blood in stool and abdominal distention.  Genitourinary: Denies dysuria, urgency, frequency, hematuria, flank pain and difficulty urinating.  Musculoskeletal: Denies, joint swelling,  and gait problem.  Skin: Denies pallor, rash and wound.  Neurological: Denies dizziness, seizures, syncope, weakness, light-headedness, numbness and headaches.  Hematological: Denies adenopathy. Easy bruising, personal or family bleeding history  Psychiatric/Behavioral: Denies suicidal ideation, mood changes, confusion, nervousness, sleep disturbance and agitation  Physical Exam: Blood pressure 111/61, pulse 96, temperature 98.1 F (36.7 C), temperature source Oral, resp. rate 24, last menstrual period 05/22/2011, SpO2 97.00%.  general: Alert, awake, oriented x3, in moderate distress and rubbing her right thigh throughout our conversation. HEENT: Normocephalic, atraumatic, pupils equal round and reactive to light, wears corrective lenses. Dry mucous membranes. Neck:   Supple, no JVD, no lymphadenopathy, no bruits, no goiter. Cardiovascular: Tachycardic, regular rhythm, no murmurs, rubs or gallops. Lungs: Clear to auscultation bilaterally. Abdomen: Soft, nontender, nondistended, positive bowel sounds. Extremities: No clubbing, cyanosis or edema. Positive pedal pulses. Neuro: Grossly intact and nonfocal.   Labs on Admission:  Results for orders placed during the hospital encounter of 05/26/11 (from the past 48 hour(s))  CBC     Status: Abnormal   Collection Time   05/26/11  9:28 AM      Component Value Range Comment   WBC 16.7 (*) 4.0 - 10.5 (K/uL)    RBC 2.13 (*) 3.87 - 5.11 (MIL/uL)    Hemoglobin 7.7 (*) 12.0 - 15.0 (g/dL)    HCT 13.0 (*) 86.5 - 46.0 (%)    MCV 97.7  78.0 - 100.0 (fL)    MCH 36.2 (*) 26.0 - 34.0 (pg)    MCHC 37.0 (*) 30.0 - 36.0 (g/dL)    RDW 78.4 (*) 69.6 - 15.5 (%)    Platelets 215  150 - 400 (K/uL)   DIFFERENTIAL     Status: Abnormal   Collection Time   05/26/11  9:28 AM      Component Value Range Comment   Neutrophils Relative 71  43 - 77 (%)    Neutro Abs 11.9 (*) 1.7 - 7.7 (K/uL)    Lymphocytes Relative 16  12 - 46 (%)    Lymphs Abs 2.6  0.7 - 4.0 (K/uL)    Monocytes Relative 12  3 - 12 (%)    Monocytes Absolute 2.1 (*) 0.1 - 1.0 (K/uL)    Eosinophils Relative 0  0 - 5 (%)    Eosinophils Absolute 0.0  0.0 - 0.7 (K/uL)    Basophils Relative 1  0 - 1 (%)    Basophils Absolute 0.1  0.0 - 0.1 (K/uL)    RBC Morphology SICKLE CELLS     COMPREHENSIVE METABOLIC PANEL     Status: Abnormal   Collection Time   05/26/11  9:28 AM      Component Value Range Comment   Sodium 135  135 - 145 (mEq/L)    Potassium 4.3  3.5 - 5.1 (mEq/L)    Chloride 102  96 - 112 (mEq/L)    CO2 25  19 - 32 (mEq/L)    Glucose, Bld 90  70 - 99 (mg/dL)    BUN 5 (*) 6 - 23 (mg/dL)    Creatinine, Ser <2.95 (*) 0.50 - 1.10 (mg/dL) ICTERUS AT THIS LEVEL MAY AFFECT RESULT   Calcium 8.4  8.4 - 10.5 (mg/dL)    Total Protein 7.4  6.0 - 8.3 (g/dL)    Albumin 3.5   3.5 - 5.2 (g/dL)    AST 86 (*) 0 - 37 (U/L)    ALT 32  0 - 35 (U/L)    Alkaline Phosphatase 183 (*) 39 - 117 (U/L)    Total Bilirubin 6.4 (*) 0.3 - 1.2 (mg/dL)    GFR calc non Af Amer NOT CALCULATED  >60 (mL/min)    GFR calc Af Amer NOT CALCULATED  >60 (mL/min)   RETICULOCYTES  Status: Abnormal   Collection Time   05/26/11  9:28 AM      Component Value Range Comment   Retic Ct Pct >23.0 (*) 0.4 - 3.1 (%) RESULT TO FOLLOW   RBC. 2.13 (*) 3.87 - 5.11 (MIL/uL)   CULTURE, BLOOD (ROUTINE X 2)     Status: Normal (Preliminary result)   Collection Time   05/26/11  9:47 AM      Component Value Range Comment   Specimen Description Blood PORTA CATH 6CC      Special Requests NONE      Culture NO GROWTH <24 HRS      Report Status PENDING     CULTURE, BLOOD (ROUTINE X 2)     Status: Normal (Preliminary result)   Collection Time   05/26/11  9:47 AM      Component Value Range Comment   Specimen Description Blood      Special Requests NONE      Culture NO GROWTH <24 HRS      Report Status PENDING     URINALYSIS, ROUTINE W REFLEX MICROSCOPIC     Status: Abnormal   Collection Time   05/26/11 11:08 AM      Component Value Range Comment   Color, Urine AMBER (*) YELLOW  BIOCHEMICALS MAY BE AFFECTED BY COLOR   Appearance CLEAR  CLEAR     Specific Gravity, Urine 1.020  1.005 - 1.030     pH 6.0  5.0 - 8.0     Glucose, UA NEGATIVE  NEGATIVE (mg/dL)    Hgb urine dipstick LARGE (*) NEGATIVE     Bilirubin Urine MODERATE (*) NEGATIVE     Ketones, ur NEGATIVE  NEGATIVE (mg/dL)    Protein, ur 161 (*) NEGATIVE (mg/dL)    Urobilinogen, UA 1.0  0.0 - 1.0 (mg/dL)    Nitrite NEGATIVE  NEGATIVE     Leukocytes, UA NEGATIVE  NEGATIVE    URINE MICROSCOPIC-ADD ON     Status: Normal   Collection Time   05/26/11 11:08 AM      Component Value Range Comment   Squamous Epithelial / LPF RARE  RARE     WBC, UA 0-2  <3 (WBC/hpf)    RBC / HPF 0-2  <3 (RBC/hpf)    Bacteria, UA RARE  RARE      Radiological Exams on  Admission: Dg Chest 2 View  05/26/2011  *RADIOLOGY REPORT*  Clinical Data: Sickle cell crisis with back and hip pain and fever.  CHEST - 2 VIEW  Comparison: Chest radiograph 04/27/2011.  Chest CT 11/14/2010.  Findings: Right IJ power port terminates in the proximal right atrium.  Heart size within normal limits.  Mediastinal hilar contours normal.  Normal pulmonary vascularity.  The lungs are clear.  There is no pleural effusion or pneumothorax. Cholecystectomy clips.  No acute bony abnormality is identified. 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  There is some cystic changes in the right humeral head, which likely reflect AVN.  IMPRESSION:  1.  No acute cardiopulmonary disease. 2. 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  Original Report Authenticated By: Britta Mccreedy, M.D.    Assessment/Plan Principal Problem:  *Sickle cell crisis Active Problems:  Fever  Leukocytosis  HTN (hypertension)  #1 sickle cell anemia with painful vaso-occlusive crisis: We'll admit to the hospital for pain control. Will start on IV fluids, Dilaudid, anti-emetics as needed. Her hemoglobin is  7.7, baseline is around 8, no need for transfusion at this time.  #2 fever: She had a temperature of 102.8 on admission. Chest x-ray and urinalysis are negative for source of infection. Currently denies any signs or symptoms that would point towards an infectious source so will hold off on antibiotics at this time.  #3 leukocytosis: As above, no signs of infection, wonder if secondary to demargination from acute stressful event. Follow in the morning.   #4 history of hypertension: blood pressure is currently well controlled, we'll continue her angiotensin receptor blocker.    #5 DVT prophylaxis: SCDs.   Time Spent on Admission:  45 minutes.   HERNANDEZ ACOSTA,[name] 05/26/2011, 1:11 PM

## 2011-05-26 NOTE — ED Provider Notes (Signed)
History     CSN: [id number] Arrival date & time: 05/26/2011  9:19 AM  Chief Complaint  Patient presents with  . Sickle Cell Pain Crisis   Patient is a 30 y.o. female presenting with sickle cell pain and fever. The history is provided by the patient.  Sickle Cell Pain Crisis  This is a new (pt states today she started with a fever and hurting all over.  this is similar to her sickle crisis) problem. The current episode started today. The problem occurs occasionally. The problem has been unchanged. The pain is associated with an unknown factor. The pain location is generalized. Site of pain is localized in a joint. The pain is severe. The symptoms are relieved by nothing. Associated symptoms include weakness. Pertinent negatives include no chest pain, no blurred vision, no double vision, no abdominal pain, no diarrhea, no dysuria, no hematuria, no congestion, no headaches, no back pain, no cough, no difficulty breathing, no rash and no eye discharge. The fever has been present for less than 1 day. There is no swelling present.  Fever Primary symptoms of the febrile illness include fever and arthralgias. Primary symptoms do not include fatigue, headaches, cough, abdominal pain, diarrhea, dysuria or rash.    Past Medical History  Diagnosis Date  . Sickle cell disease   . Sickle cell disease, type S     Past Surgical History  Procedure Date  . Cholecystectomy   .  left knee acl reconstruction   . Cesarean section     x 2  . Portacath placement     No family history on file.  History  Substance Use Topics  . Smoking status: Never Smoker   . Smokeless tobacco: Not on file  . Alcohol Use: No    OB History    Grav Para Term Preterm Abortions TAB SAB Ect Mult Living                  Review of Systems  Constitutional: Positive for fever. Negative for fatigue.  HENT: Negative for congestion, sinus pressure and ear discharge.   Eyes: Negative for blurred vision, double vision and  discharge.  Respiratory: Negative for cough.   Cardiovascular: Negative for chest pain.  Gastrointestinal: Negative for abdominal pain and diarrhea.  Genitourinary: Negative for dysuria, frequency and hematuria.  Musculoskeletal: Positive for arthralgias. Negative for back pain.  Skin: Negative for rash.  Neurological: Positive for weakness. Negative for seizures and headaches.  Hematological: Negative.   Psychiatric/Behavioral: Negative for hallucinations.    Physical Exam  BP 111/61  Pulse 96  Temp(Src) 98.1 F (36.7 C) (Oral)  Resp 24  SpO2 97%  LMP 05/22/2011  Physical Exam  Constitutional: She is oriented to person, place, and time. She appears distressed.       Pt appears unconforatoble  HENT:  Head: Normocephalic and atraumatic.  Eyes: Conjunctivae and EOM are normal. No scleral icterus.  Neck: Neck supple. No thyromegaly present.  Cardiovascular: Normal rate and regular rhythm.  Exam reveals no gallop and no friction rub.   No murmur heard. Pulmonary/Chest: No stridor. She has no wheezes. She has no rales. She exhibits no tenderness.  Abdominal: She exhibits no distension. There is no tenderness. There is no rebound.  Musculoskeletal: Normal range of motion. She exhibits tenderness. She exhibits no edema.       All extremitis painful to touch  Lymphadenopathy:    She has no cervical adenopathy.  Neurological: She is oriented to person, place, and  time. Coordination normal.  Skin: No rash noted. No erythema.  Psychiatric: She has a normal mood and affect. Her behavior is normal.    ED Course  Procedures  Pt improved some with tx  MDM Fever.  Sickle crisis  Results for orders placed during the hospital encounter of 05/26/11  CBC      Component Value Range   WBC 16.7 (*) 4.0 - 10.5 (K/uL)   RBC 2.13 (*) 3.87 - 5.11 (MIL/uL)   Hemoglobin 7.7 (*) 12.0 - 15.0 (g/dL)   HCT 81.1 (*) 91.4 - 46.0 (%)   MCV 97.7  78.0 - 100.0 (fL)   MCH 36.2 (*) 26.0 - 34.0 (pg)    MCHC 37.0 (*) 30.0 - 36.0 (g/dL)   RDW 78.2 (*) 95.6 - 15.5 (%)   Platelets 215  150 - 400 (K/uL)  DIFFERENTIAL      Component Value Range   Neutrophils Relative 71  43 - 77 (%)   Neutro Abs 11.9 (*) 1.7 - 7.7 (K/uL)   Lymphocytes Relative 16  12 - 46 (%)   Lymphs Abs 2.6  0.7 - 4.0 (K/uL)   Monocytes Relative 12  3 - 12 (%)   Monocytes Absolute 2.1 (*) 0.1 - 1.0 (K/uL)   Eosinophils Relative 0  0 - 5 (%)   Eosinophils Absolute 0.0  0.0 - 0.7 (K/uL)   Basophils Relative 1  0 - 1 (%)   Basophils Absolute 0.1  0.0 - 0.1 (K/uL)   RBC Morphology SICKLE CELLS    COMPREHENSIVE METABOLIC PANEL      Component Value Range   Sodium 135  135 - 145 (mEq/L)   Potassium 4.3  3.5 - 5.1 (mEq/L)   Chloride 102  96 - 112 (mEq/L)   CO2 25  19 - 32 (mEq/L)   Glucose, Bld 90  70 - 99 (mg/dL)   BUN 5 (*) 6 - 23 (mg/dL)   Creatinine, Ser <2.13 (*) 0.50 - 1.10 (mg/dL)   Calcium 8.4  8.4 - 08.6 (mg/dL)   Total Protein 7.4  6.0 - 8.3 (g/dL)   Albumin 3.5  3.5 - 5.2 (g/dL)   AST 86 (*) 0 - 37 (U/L)   ALT 32  0 - 35 (U/L)   Alkaline Phosphatase 183 (*) 39 - 117 (U/L)   Total Bilirubin 6.4 (*) 0.3 - 1.2 (mg/dL)   GFR calc non Af Amer NOT CALCULATED  >60 (mL/min)   GFR calc Af Amer NOT CALCULATED  >60 (mL/min)  RETICULOCYTES      Component Value Range   Retic Ct Pct >23.0 (*) 0.4 - 3.1 (%)   RBC. 2.13 (*) 3.87 - 5.11 (MIL/uL)  URINALYSIS, ROUTINE W REFLEX MICROSCOPIC      Component Value Range   Color, Urine AMBER (*) YELLOW    Appearance CLEAR  CLEAR    Specific Gravity, Urine 1.020  1.005 - 1.030    pH 6.0  5.0 - 8.0    Glucose, UA NEGATIVE  NEGATIVE (mg/dL)   Hgb urine dipstick LARGE (*) NEGATIVE    Bilirubin Urine MODERATE (*) NEGATIVE    Ketones, ur NEGATIVE  NEGATIVE (mg/dL)   Protein, ur 578 (*) NEGATIVE (mg/dL)   Urobilinogen, UA 1.0  0.0 - 1.0 (mg/dL)   Nitrite NEGATIVE  NEGATIVE    Leukocytes, UA NEGATIVE  NEGATIVE   CULTURE, BLOOD (ROUTINE X 2)      Component Value Range   Specimen  Description Blood PORTA CATH 6CC  Special Requests NONE     Culture NO GROWTH <24 HRS     Report Status PENDING    CULTURE, BLOOD (ROUTINE X 2)      Component Value Range   Specimen Description Blood     Special Requests NONE     Culture NO GROWTH <24 HRS     Report Status PENDING    URINE MICROSCOPIC-ADD ON      Component Value Range   Squamous Epithelial / LPF RARE  RARE    WBC, UA 0-2  <3 (WBC/hpf)   RBC / HPF 0-2  <3 (RBC/hpf)   Bacteria, UA RARE  RARE        [name], MD 05/26/11 1313

## 2011-05-26 NOTE — ED Notes (Signed)
Pt up to bathroom attempting to obtain urine

## 2011-05-26 NOTE — ED Notes (Signed)
Pt eval by hospitalist for admission 

## 2011-05-27 LAB — BASIC METABOLIC PANEL
BUN: 4 mg/dL — ABNORMAL LOW (ref 6–23)
Chloride: 104 mEq/L (ref 96–112)
Glucose, Bld: 101 mg/dL — ABNORMAL HIGH (ref 70–99)
Potassium: 3.5 mEq/L (ref 3.5–5.1)
Sodium: 135 mEq/L (ref 135–145)

## 2011-05-27 LAB — CBC
HCT: 19.3 % — ABNORMAL LOW (ref 36.0–46.0)
Hemoglobin: 7.2 g/dL — ABNORMAL LOW (ref 12.0–15.0)
MCHC: 37.3 g/dL — ABNORMAL HIGH (ref 30.0–36.0)
RBC: 2.01 MIL/uL — ABNORMAL LOW (ref 3.87–5.11)

## 2011-05-27 MED ORDER — HYDROMORPHONE HCL 1 MG/ML IJ SOLN
1.0000 mg | INTRAMUSCULAR | Status: DC | PRN
  Administered 2011-05-27 (×3): 1 mg via INTRAVENOUS
  Administered 2011-05-27 – 2011-05-30 (×25): 2 mg via INTRAVENOUS
  Administered 2011-05-30 (×2): 1 mg via INTRAVENOUS
  Administered 2011-05-30 – 2011-05-31 (×7): 2 mg via INTRAVENOUS
  Administered 2011-05-31: 1 mg via INTRAVENOUS
  Administered 2011-06-01: 2 mg via INTRAVENOUS
  Administered 2011-06-01 (×3): 1 mg via INTRAVENOUS
  Administered 2011-06-01: 2 mg via INTRAVENOUS
  Administered 2011-06-02: 1 mg via INTRAVENOUS
  Administered 2011-06-02: 2 mg via INTRAVENOUS
  Administered 2011-06-02 (×2): 1 mg via INTRAVENOUS
  Administered 2011-06-02 (×2): 2 mg via INTRAVENOUS
  Administered 2011-06-02 – 2011-06-03 (×3): 1 mg via INTRAVENOUS
  Administered 2011-06-03 (×3): 2 mg via INTRAVENOUS
  Administered 2011-06-03 (×3): 1 mg via INTRAVENOUS
  Administered 2011-06-03 – 2011-06-04 (×4): 2 mg via INTRAVENOUS
  Administered 2011-06-04: 1 mg via INTRAVENOUS
  Administered 2011-06-04: 2 mg via INTRAVENOUS
  Administered 2011-06-04: 1 mg via INTRAVENOUS
  Administered 2011-06-04: 2 mg via INTRAVENOUS
  Administered 2011-06-05: 1 mg via INTRAVENOUS
  Administered 2011-06-05 (×2): 2 mg via INTRAVENOUS
  Filled 2011-05-27 (×2): qty 2
  Filled 2011-05-27 (×2): qty 1
  Filled 2011-05-27 (×2): qty 2
  Filled 2011-05-27: qty 1
  Filled 2011-05-27 (×7): qty 2
  Filled 2011-05-27: qty 1
  Filled 2011-05-27 (×6): qty 2
  Filled 2011-05-27: qty 1
  Filled 2011-05-27 (×3): qty 2
  Filled 2011-05-27: qty 1
  Filled 2011-05-27 (×3): qty 2
  Filled 2011-05-27 (×2): qty 1
  Filled 2011-05-27 (×2): qty 2
  Filled 2011-05-27: qty 1
  Filled 2011-05-27 (×8): qty 2
  Filled 2011-05-27 (×2): qty 1
  Filled 2011-05-27: qty 2
  Filled 2011-05-27: qty 1
  Filled 2011-05-27: qty 2
  Filled 2011-05-27: qty 1
  Filled 2011-05-27 (×3): qty 2
  Filled 2011-05-27 (×4): qty 1
  Filled 2011-05-27: qty 2
  Filled 2011-05-27: qty 1
  Filled 2011-05-27 (×2): qty 2
  Filled 2011-05-27: qty 1
  Filled 2011-05-27: qty 2
  Filled 2011-05-27: qty 1
  Filled 2011-05-27 (×2): qty 2
  Filled 2011-05-27: qty 1
  Filled 2011-05-27 (×4): qty 2

## 2011-05-27 MED ORDER — HYDROXYUREA 500 MG PO CAPS
500.0000 mg | ORAL_CAPSULE | ORAL | Status: DC
  Administered 2011-05-27 – 2011-06-04 (×6): 500 mg via ORAL
  Filled 2011-05-27: qty 1
  Filled 2011-05-27: qty 2
  Filled 2011-05-27 (×7): qty 1

## 2011-05-27 MED ORDER — HYDROXYUREA 500 MG PO CAPS
1000.0000 mg | ORAL_CAPSULE | ORAL | Status: DC
  Administered 2011-05-29 – 2011-06-05 (×4): 1000 mg via ORAL
  Filled 2011-05-27: qty 1
  Filled 2011-05-27 (×3): qty 2

## 2011-05-27 NOTE — Progress Notes (Signed)
Subjective: Still complaining of pain all over but worse in the hips and thighs.  Objective: Vital signs in last 24 hours: Temp:  [98.1 F (36.7 C)-101.8 F (38.8 C)] 100.7 F (38.2 C) (09/02 0531) Pulse Rate:  [96-121] 121  (09/02 0531) Resp:  [16-20] 20  (09/02 0531) BP: (95-120)/(61-76) 95/63 mmHg (09/02 0531) SpO2:  [90 %-97 %] 90 % (09/02 0531) Weight:  [65.908 kg (145 lb 4.8 oz)] 145 lb 4.8 oz (65.908 kg) (09/01 1549) Weight change:  Last BM Date: 05/25/11  Intake/Output from previous day: 09/01 0701 - 09/02 0700 In: 120 [P.O.:120] Out: -  I/O this shift: In: 240 [P.O.:240] Out: -    Physical Exam: General: Alert, awake, oriented x3 HEENT: No bruits, no goiter. Heart: Regular rate and rhythm, without murmurs, rubs, gallops. Lungs: Clear to auscultation bilaterally. Abdomen: Soft, nontender, nondistended, positive bowel sounds. Extremities: No clubbing cyanosis or edema with positive pedal pulses. Neuro: Grossly intact, nonfocal.    Lab Results:  [facility] 05/27/11 0605 05/26/11 0928  WBC 10.3 16.7*  HGB 7.2* 7.7*  HCT 19.3* 20.8*  PLT 227 215    Basename 05/27/11 0605 05/26/11 0928  NA 135 135  K 3.5 4.3  CL 104 102  CO2 26 25  GLUCOSE 101* 90  BUN 4* 5*  CREATININE <0.47* <0.47*  CALCIUM 8.2* 8.4   Recent Results (from the past 240 hour(s))  CULTURE, BLOOD (ROUTINE X 2)     Status: Normal (Preliminary result)   Collection Time   05/26/11  9:47 AM      Component Value Range Status Comment   Specimen Description Blood PORTA CATH 6CC   Final    Special Requests NONE   Final    Culture NO GROWTH 1 DAY   Final    Report Status PENDING   Incomplete   CULTURE, BLOOD (ROUTINE X 2)     Status: Normal (Preliminary result)   Collection Time   05/26/11  9:47 AM      Component Value Range Status Comment   Specimen Description Blood   Final    Special Requests NONE   Final    Culture NO GROWTH 1 DAY   Final    Report Status PENDING   Incomplete       Studies/Results: Dg Chest 2 View  05/26/2011  *RADIOLOGY REPORT*  Clinical Data: Sickle cell crisis with back and hip pain and fever.  CHEST - 2 VIEW  Comparison: Chest radiograph 04/27/2011.  Chest CT 11/14/2010.  Findings: Right IJ power port terminates in the proximal right atrium.  Heart size within normal limits.  Mediastinal hilar contours normal.  Normal pulmonary vascularity.  The lungs are clear.  There is no pleural effusion or pneumothorax. Cholecystectomy clips.  No acute bony abnormality is identified. 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  There is some cystic changes in the right humeral head, which likely reflect AVN.  IMPRESSION:  1.  No acute cardiopulmonary disease. 2. 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  Original Report Authenticated By: Britta Mccreedy, M.D.    Medications: Scheduled Meds:   . folic acid  1 mg Oral Daily  . hydroxyurea  1,000 mg Oral Custom  . hydroxyurea  500 mg Oral Custom  . loratadine  10 mg Oral Daily  . losartan  25 mg Oral Daily  . sodium chloride  1,000 mL Intravenous Once  . DISCONTD:  hydroxyurea  500 mg Oral UD  . DISCONTD: hydroxyurea  500-1,000 mg Oral UD   Continuous Infusions:   . sodium chloride 125 mL/hr at 05/27/11 0904  . DISCONTD: sodium chloride 100 mL/hr at 05/26/11 1309   PRN Meds:.ALPRAZolam, HYDROmorphone, ondansetron (ZOFRAN) IV, ondansetron, oxyCODONE, DISCONTD: HYDROmorphone  Assessment/Plan:  Principal Problem:  *Sickle cell crisis Active Problems:  Fever  Leukocytosis  HTN (hypertension)  #1 sickle cell crises: Still with significant pain. Increase Dilaudid to 1-2 mg as needed.   #2 leukocytosis: Resolved.  #3 fever: Spiked a temperature 100.7. Still no source of infection, no antibiotics at the moment.    LOS: 1 day   HERNANDEZ ACOSTA,[name] 05/27/2011, 11:33 AM

## 2011-05-27 NOTE — Progress Notes (Signed)
MD aware of patient temp of 102.

## 2011-05-28 LAB — CBC
HCT: 17.9 % — ABNORMAL LOW (ref 36.0–46.0)
Hemoglobin: 6.7 g/dL — CL (ref 12.0–15.0)
MCH: 35.3 pg — ABNORMAL HIGH (ref 26.0–34.0)
MCV: 94.2 fL (ref 78.0–100.0)
Platelets: 236 10*3/uL (ref 150–400)
RBC: 1.9 MIL/uL — ABNORMAL LOW (ref 3.87–5.11)
WBC: 10.8 10*3/uL — ABNORMAL HIGH (ref 4.0–10.5)

## 2011-05-28 MED ORDER — SODIUM CHLORIDE 0.9 % IJ SOLN
INTRAMUSCULAR | Status: AC
  Administered 2011-05-28: 14:00:00
  Filled 2011-05-28: qty 10

## 2011-05-28 MED ORDER — SODIUM CHLORIDE 0.9 % IJ SOLN
INTRAMUSCULAR | Status: AC
  Administered 2011-05-28: 08:00:00
  Filled 2011-05-28: qty 10

## 2011-05-28 MED ORDER — SODIUM CHLORIDE 0.9 % IJ SOLN
INTRAMUSCULAR | Status: AC
  Administered 2011-05-28: 10 mL
  Filled 2011-05-28: qty 10

## 2011-05-28 MED ORDER — IBUPROFEN 800 MG PO TABS
400.0000 mg | ORAL_TABLET | Freq: Three times a day (TID) | ORAL | Status: DC | PRN
  Administered 2011-05-28 – 2011-06-01 (×5): 400 mg via ORAL
  Filled 2011-05-28: qty 2
  Filled 2011-05-28 (×4): qty 1

## 2011-05-28 NOTE — Progress Notes (Signed)
CRITICAL VALUE ALERT  Critical value received:  hgb 6.7  Date of notification:  05-28-11  Time of notification: [id number]  Critical value read back:yes  Nurse who received alert:  Randa Ngo  MD notified (1st page): Hospitalist  Time of first page:  0705  MD notified (2nd page):  Time of second page:  Responding MD:  Ardyth Harps  Time MD responded:  [phone]

## 2011-05-28 NOTE — Progress Notes (Signed)
Subjective: Still complaining of pain all over but worse in the hips and thighs. She thinks the pain is a little improved today.  Objective: Vital signs in last 24 hours: Temp:  [99.6 F (37.6 C)-101.7 F (38.7 C)] 99.6 F (37.6 C) (09/03 0509) Pulse Rate:  [109-121] 109  (09/03 0509) Resp:  [18-20] 20  (09/03 0509) BP: (100-120)/(57-71) 100/57 mmHg (09/03 0509) SpO2:  [94 %-98 %] 96 % (09/03 0509) Weight change:  Last BM Date: 05/25/11  Intake/Output from previous day: 09/02 0701 - 09/03 0700 In: 480 [P.O.:480] Out: -      Physical Exam: General: Alert, awake, oriented x3 HEENT: No bruits, no goiter. Heart: Regular rate and rhythm, without murmurs, rubs, gallops. Lungs: Clear to auscultation bilaterally. Abdomen: Soft, nontender, nondistended, positive bowel sounds. Extremities: No clubbing cyanosis or edema with positive pedal pulses. Neuro: Grossly intact, nonfocal.    Lab Results:  [facility] 05/28/11 0618 05/27/11 0605  WBC 10.8* 10.3  HGB 6.7* 7.2*  HCT 17.9* 19.3*  PLT 236 227    Basename 05/27/11 0605 05/26/11 0928  NA 135 135  K 3.5 4.3  CL 104 102  CO2 26 25  GLUCOSE 101* 90  BUN 4* 5*  CREATININE <0.47* <0.47*  CALCIUM 8.2* 8.4   Recent Results (from the past 240 hour(s))  CULTURE, BLOOD (ROUTINE X 2)     Status: Normal (Preliminary result)   Collection Time   05/26/11  9:47 AM      Component Value Range Status Comment   Specimen Description Blood PORTA CATH 6CC   Final    Special Requests NONE   Final    Culture NO GROWTH 1 DAY   Final    Report Status PENDING   Incomplete   CULTURE, BLOOD (ROUTINE X 2)     Status: Normal (Preliminary result)   Collection Time   05/26/11  9:47 AM      Component Value Range Status Comment   Specimen Description Blood   Final    Special Requests NONE   Final    Culture NO GROWTH 1 DAY   Final    Report Status PENDING   Incomplete      Studies/Results: Dg Chest 2 View  05/26/2011  *RADIOLOGY REPORT*   Clinical Data: Sickle cell crisis with back and hip pain and fever.  CHEST - 2 VIEW  Comparison: Chest radiograph 04/27/2011.  Chest CT 11/14/2010.  Findings: Right IJ power port terminates in the proximal right atrium.  Heart size within normal limits.  Mediastinal hilar contours normal.  Normal pulmonary vascularity.  The lungs are clear.  There is no pleural effusion or pneumothorax. Cholecystectomy clips.  No acute bony abnormality is identified. 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  There is some cystic changes in the right humeral head, which likely reflect AVN.  IMPRESSION:  1.  No acute cardiopulmonary disease. 2. 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  Original Report Authenticated By: Britta Mccreedy, M.D.    Medications: Scheduled Meds:    . folic acid  1 mg Oral Daily  . hydroxyurea  1,000 mg Oral Custom  . hydroxyurea  500 mg Oral Custom  . loratadine  10 mg Oral Daily  . losartan  25 mg Oral Daily  . sodium chloride      . sodium chloride       Continuous Infusions:    . sodium  chloride 125 mL/hr at 05/28/11 0124   PRN Meds:.ALPRAZolam, HYDROmorphone, ibuprofen, ondansetron (ZOFRAN) IV, ondansetron, oxyCODONE, DISCONTD: HYDROmorphone  Assessment/Plan:  Principal Problem:  *Sickle cell crisis Active Problems:  Fever  Leukocytosis  HTN (hypertension)  #1 sickle cell crises: Still with significant pain. Increase Dilaudid to 1-2 mg as needed. Hemoglobin today is 6.7. We'll type and cross and transfuse 1 unit will hold one more unit of blood in case it is required.  #2 leukocytosis: Resolved.  #3 fever: Spiked a temperature 100.7. Still no source of infection, no antibiotics at the moment. Ibuprofen as needed for fever.    LOS: 2 days   Dawn [name] 05/28/2011, 9:17 AM

## 2011-05-29 LAB — CBC
MCH: 32.2 pg (ref 26.0–34.0)
MCHC: 36.1 g/dL — ABNORMAL HIGH (ref 30.0–36.0)
MCV: 89 fL (ref 78.0–100.0)
Platelets: 216 10*3/uL (ref 150–400)
RBC: 2.27 MIL/uL — ABNORMAL LOW (ref 3.87–5.11)

## 2011-05-29 LAB — BASIC METABOLIC PANEL
BUN: 6 mg/dL (ref 6–23)
CO2: 28 mEq/L (ref 19–32)
Calcium: 8.4 mg/dL (ref 8.4–10.5)
Glucose, Bld: 95 mg/dL (ref 70–99)
Sodium: 140 mEq/L (ref 135–145)

## 2011-05-29 MED ORDER — SODIUM CHLORIDE 0.9 % IJ SOLN
INTRAMUSCULAR | Status: AC
  Administered 2011-05-29: 11:00:00
  Filled 2011-05-29: qty 10

## 2011-05-29 NOTE — Progress Notes (Signed)
  Subjective: States pain is the same, not better nor worse.  Objective: Vital signs in last 24 hours: Temp:  [97.3 F (36.3 C)-98.1 F (36.7 C)] 98 F (36.7 C) (09/04 0600) Pulse Rate:  [80-96] 92  (09/04 0600) Resp:  [16-18] 16  (09/04 0600) BP: (93-109)/(56-68) 105/68 mmHg (09/04 0600) SpO2:  [90 %-100 %] 100 % (09/04 0600) Weight change:  Last BM Date: 05/25/11  Intake/Output from previous day: 09/03 0701 - 09/04 0700 In: 4781 [P.O.:1200; I.V.:2881; Blood:700] Out: 0      Physical Exam: General: Alert, awake, oriented x3, in no acute distress. HEENT: No bruits, no goiter. Heart: Regular rate and rhythm, without murmurs, rubs, gallops. Lungs: Clear to auscultation bilaterally. Abdomen: Soft, nontender, nondistended, positive bowel sounds. Extremities: No clubbing cyanosis or edema with positive pedal pulses. Neuro: Grossly intact, nonfocal.    Lab Results:  [facility] 05/29/11 0519 05/28/11 0618  WBC 8.8 10.8*  HGB 7.3* 6.7*  HCT 20.2* 17.9*  PLT 216 236    Basename 05/29/11 0519 05/27/11 0605  NA 140 135  K 3.9 3.5  CL 105 104  CO2 28 26  GLUCOSE 95 101*  BUN 6 4*  CREATININE <0.47* <0.47*  CALCIUM 8.4 8.2*   Recent Results (from the past 240 hour(s))  CULTURE, BLOOD (ROUTINE X 2)     Status: Normal (Preliminary result)   Collection Time   05/26/11  9:47 AM      Component Value Range Status Comment   Specimen Description Blood PORTA CATH 6CC   Final    Special Requests NONE   Final    Culture NO GROWTH 1 DAY   Final    Report Status PENDING   Incomplete   CULTURE, BLOOD (ROUTINE X 2)     Status: Normal (Preliminary result)   Collection Time   05/26/11  9:47 AM      Component Value Range Status Comment   Specimen Description Blood   Final    Special Requests NONE   Final    Culture NO GROWTH 1 DAY   Final    Report Status PENDING   Incomplete      Studies/Results: No results found.  Medications: Scheduled Meds:   . folic acid  1 mg Oral Daily   . hydroxyurea  1,000 mg Oral Custom  . hydroxyurea  500 mg Oral Custom  . loratadine  10 mg Oral Daily  . losartan  25 mg Oral Daily  . sodium chloride       Continuous Infusions:   . sodium chloride 125 mL/hr at 05/29/11 0031   PRN Meds:.ALPRAZolam, HYDROmorphone, ibuprofen, ondansetron (ZOFRAN) IV, ondansetron, oxyCODONE  Assessment/Plan:  Principal Problem:  *Sickle cell crisis Active Problems:  Fever  Leukocytosis  HTN (hypertension)  Problem #1 sickle cell painful crisis: He continues to have pain. She states that this is normal for her and usually takes about 4-5 days for pain to completely improve. We'll continue with current pain management. She was transfused yesterday 1 unit of PRBCs for hemoglobin of 6.7. Her hemoglobin today is 7.3. She has a unit of blood on hold however I will not transfuse her unless her hemoglobin were to drop below 7.   Problem #2 leukocytosis: Resolved.  Problem #3 fever: Has been afebrile for the past 24 hours. No antibiotics.   LOS: 3 days   Dawn [name],Dawn [name] 05/29/2011, 10:01 AM

## 2011-05-29 NOTE — Progress Notes (Signed)
UR Chart Review Completed  

## 2011-05-30 LAB — CBC
Hemoglobin: 6.9 g/dL — CL (ref 12.0–15.0)
MCH: 32.4 pg (ref 26.0–34.0)
RBC: 2.13 MIL/uL — ABNORMAL LOW (ref 3.87–5.11)

## 2011-05-30 MED ORDER — SENNOSIDES-DOCUSATE SODIUM 8.6-50 MG PO TABS
1.0000 | ORAL_TABLET | Freq: Every day | ORAL | Status: DC
  Administered 2011-05-30 – 2011-06-04 (×5): 1 via ORAL
  Filled 2011-05-30 (×5): qty 1

## 2011-05-30 MED ORDER — OXYCODONE HCL 5 MG PO TABS
10.0000 mg | ORAL_TABLET | Freq: Four times a day (QID) | ORAL | Status: DC
  Administered 2011-05-30 – 2011-05-31 (×8): 10 mg via ORAL
  Filled 2011-05-30 (×2): qty 2
  Filled 2011-05-30: qty 1
  Filled 2011-05-30 (×4): qty 2

## 2011-05-30 NOTE — Progress Notes (Signed)
Subjective: This 30 year old lady is well known to our service. She presented again with sickle cell crisis. She tells me that her pain is really not improve significantly. She continues to take intravenous opioids every 2-3 hours. She normally takes oxycodone 10 mg orally as an outpatient when necessary. She denies dyspnea, cough.            Physical Exam: Blood pressure 99/65, pulse 92, temperature 98.2 F (36.8 C), temperature source Oral, resp. rate 18, height 5\' 4"  (1.626 m), weight 65.908 kg (145 lb 4.8 oz), last menstrual period 05/22/2011, SpO2 100.00%. She looks to be in some pain. Heart sounds are present and normal lung fields are clear. She is alert and orientated without any focal neurological signs.   Investigations: Results for orders placed during the hospital encounter of 05/26/11 (from the past 48 hour(s))  BASIC METABOLIC PANEL     Status: Abnormal   Collection Time   05/29/11  5:19 AM      Component Value Range Comment   Sodium 140  135 - 145 (mEq/L)    Potassium 3.9  3.5 - 5.1 (mEq/L)    Chloride 105  96 - 112 (mEq/L)    CO2 28  19 - 32 (mEq/L)    Glucose, Bld 95  70 - 99 (mg/dL)    BUN 6  6 - 23 (mg/dL)    Creatinine, Ser <9.56 (*) 0.50 - 1.10 (mg/dL) ICTERUS AT THIS LEVEL MAY AFFECT RESULT   Calcium 8.4  8.4 - 10.5 (mg/dL)    GFR calc non Af Amer NOT CALCULATED  >60 (mL/min)    GFR calc Af Amer NOT CALCULATED  >60 (mL/min)   CBC     Status: Abnormal   Collection Time   05/29/11  5:19 AM      Component Value Range Comment   WBC 8.8  4.0 - 10.5 (K/uL)    RBC 2.27 (*) 3.87 - 5.11 (MIL/uL)    Hemoglobin 7.3 (*) 12.0 - 15.0 (g/dL)    HCT 21.3 (*) 08.6 - 46.0 (%)    MCV 89.0  78.0 - 100.0 (fL)    MCH 32.2  26.0 - 34.0 (pg)    MCHC 36.1 (*) 30.0 - 36.0 (g/dL)    RDW 57.8 (*) 46.9 - 15.5 (%)    Platelets 216  150 - 400 (K/uL)   CBC     Status: Abnormal   Collection Time   05/30/11  5:54 AM      Component Value Range Comment   WBC 7.8  4.0 - 10.5 (K/uL)    RBC 2.13 (*) 3.87 - 5.11 (MIL/uL)    Hemoglobin 6.9 (*) 12.0 - 15.0 (g/dL)    HCT 62.9 (*) 52.8 - 46.0 (%)    MCV 89.2  78.0 - 100.0 (fL)    MCH 32.4  26.0 - 34.0 (pg)    MCHC 36.3 (*) 30.0 - 36.0 (g/dL)    RDW 41.3 (*) 24.4 - 15.5 (%)    Platelets 259  150 - 400 (K/uL)    Recent Results (from the past 240 hour(s))  CULTURE, BLOOD (ROUTINE X 2)     Status: Normal (Preliminary result)   Collection Time   05/26/11  9:47 AM      Component Value Range Status Comment   Specimen Description Blood PORTA CATH 6CC   Final    Special Requests NONE   Final    Culture NO GROWTH 3 DAYS   Final    Report Status  PENDING   Incomplete   CULTURE, BLOOD (ROUTINE X 2)     Status: Normal (Preliminary result)   Collection Time   05/26/11  9:47 AM      Component Value Range Status Comment   Specimen Description Blood   Final    Special Requests NONE   Final    Culture NO GROWTH 3 DAYS   Final    Report Status PENDING   Incomplete         Medications: I have reviewed the patient's current medications.  Impression: 1. Sickle cell crisis. 2. Anemia related to sickling. 3. Hypertension. 4. Fever with blood cultures negative.     Plan: 1. Reduce intravenous fluids. 2. Start scheduled oxycodone every 4 hours. 3. She may need further blood transfusion tomorrow if hemoglobin continues to remain low.     LOS: 4 days   GOSRANI,NIMISH C 05/30/2011, 10:13 AM

## 2011-05-30 NOTE — Progress Notes (Addendum)
CRITICAL VALUE ALERT  Critical value received:  6.9 hgb  Date of notification:  05/30/11  Time of notification:  0628  Critical value read back: yes  Nurse who received alert:  Foye Deer RN MD notified (1st page):  Dr. Rito Ehrlich  Time of first page:  0628  MD notified (2nd page): Dr. Rito Ehrlich  Time of second page: [phone]  Responding MD:  Dr. Rito Ehrlich  Time MD responded:  [phone] no new orders given at this time

## 2011-05-30 NOTE — Progress Notes (Signed)
CRITICAL VALUE ALERT  Critical value received:  Hgb - 6.9  Date of notification:  05/30/11  Time of notification:  0628  Critical value read back:yes  Nurse who received alert:  TN, RN  MD notified (1st page):  Karilyn Cota  Time of first page:  0800  MD notified (2nd page):  Time of second page:  Responding MD:  Karilyn Cota  Time MD responded:

## 2011-05-31 LAB — CBC
HCT: 19.4 % — ABNORMAL LOW (ref 36.0–46.0)
Hemoglobin: 6.9 g/dL — CL (ref 12.0–15.0)
MCV: 89.8 fL (ref 78.0–100.0)
RDW: 25.5 % — ABNORMAL HIGH (ref 11.5–15.5)
WBC: 6.9 10*3/uL (ref 4.0–10.5)

## 2011-05-31 LAB — CULTURE, BLOOD (ROUTINE X 2)
Culture: NO GROWTH
Culture: NO GROWTH

## 2011-05-31 LAB — COMPREHENSIVE METABOLIC PANEL
Albumin: 2.6 g/dL — ABNORMAL LOW (ref 3.5–5.2)
BUN: 3 mg/dL — ABNORMAL LOW (ref 6–23)
CO2: 27 mEq/L (ref 19–32)
Chloride: 104 mEq/L (ref 96–112)
Creatinine, Ser: <0.47 mg/dL — ABNORMAL LOW (ref 0.50–1.10)
Glucose, Bld: 94 mg/dL (ref 70–99)
Total Bilirubin: 3.1 mg/dL — ABNORMAL HIGH (ref 0.3–1.2)

## 2011-05-31 MED ORDER — SODIUM CHLORIDE 0.9 % IJ SOLN
INTRAMUSCULAR | Status: AC
  Administered 2011-05-31: 09:00:00
  Filled 2011-05-31: qty 10

## 2011-05-31 NOTE — Progress Notes (Signed)
Subjective: This 30 year old lady is well known to our service. She presented again with sickle cell crisis. She tells me that her pain is really not resolved, but I noticed that she is taking intravenous hydromorphone less frequently in the last 24 hours. She is drinking fluids well.           Physical Exam: Blood pressure 123/74, pulse 90, temperature 98.3 F (36.8 C), temperature source Oral, resp. rate 20, height 5\' 4"  (1.626 m), weight 65.908 kg (145 lb 4.8 oz), last menstrual period 05/22/2011, SpO2 100.00%. She looks to be in some pain. Heart sounds are present and normal lung fields are clear. She is alert and orientated without any focal neurological signs.   Investigations: Results for orders placed during the hospital encounter of 05/26/11 (from the past 48 hour(s))  CBC     Status: Abnormal   Collection Time   05/30/11  5:54 AM      Component Value Range Comment   WBC 7.8  4.0 - 10.5 (K/uL)    RBC 2.13 (*) 3.87 - 5.11 (MIL/uL)    Hemoglobin 6.9 (*) 12.0 - 15.0 (g/dL)    HCT 16.1 (*) 09.6 - 46.0 (%)    MCV 89.2  78.0 - 100.0 (fL)    MCH 32.4  26.0 - 34.0 (pg)    MCHC 36.3 (*) 30.0 - 36.0 (g/dL)    RDW 04.5 (*) 40.9 - 15.5 (%)    Platelets 259  150 - 400 (K/uL)   CBC     Status: Abnormal   Collection Time   05/31/11  6:07 AM      Component Value Range Comment   WBC 6.9  4.0 - 10.5 (K/uL)    RBC 2.16 (*) 3.87 - 5.11 (MIL/uL)    Hemoglobin 6.9 (*) 12.0 - 15.0 (g/dL)    HCT 81.1 (*) 91.4 - 46.0 (%)    MCV 89.8  78.0 - 100.0 (fL)    MCH 31.9  26.0 - 34.0 (pg)    MCHC 35.6  30.0 - 36.0 (g/dL)    RDW 78.2 (*) 95.6 - 15.5 (%)    Platelets 232  150 - 400 (K/uL)   COMPREHENSIVE METABOLIC PANEL     Status: Abnormal   Collection Time   05/31/11  6:07 AM      Component Value Range Comment   Sodium 138  135 - 145 (mEq/L)    Potassium 3.5  3.5 - 5.1 (mEq/L)    Chloride 104  96 - 112 (mEq/L)    CO2 27  19 - 32 (mEq/L)    Glucose, Bld 94  70 - 99 (mg/dL)    BUN 3 (*) 6 - 23  (mg/dL)    Creatinine, Ser <2.13 (*) 0.50 - 1.10 (mg/dL) ICTERUS AT THIS LEVEL MAY AFFECT RESULT   Calcium 8.4  8.4 - 10.5 (mg/dL)    Total Protein 6.5  6.0 - 8.3 (g/dL)    Albumin 2.6 (*) 3.5 - 5.2 (g/dL)    AST 45 (*) 0 - 37 (U/L)    ALT 24  0 - 35 (U/L)    Alkaline Phosphatase 165 (*) 39 - 117 (U/L)    Total Bilirubin 3.1 (*) 0.3 - 1.2 (mg/dL)    GFR calc non Af Amer NOT CALCULATED  >60 (mL/min)    GFR calc Af Amer NOT CALCULATED  >60 (mL/min)    Recent Results (from the past 240 hour(s))  CULTURE, BLOOD (ROUTINE X 2)     Status: Normal (  Preliminary result)   Collection Time   05/26/11  9:47 AM      Component Value Range Status Comment   Specimen Description BLOOD PORTA CATH DRAWN BY RN   Final    Special Requests BOTTLES DRAWN AEROBIC AND ANAEROBIC 6CC   Final    Culture NO GROWTH 4 DAYS   Final    Report Status PENDING   Incomplete   CULTURE, BLOOD (ROUTINE X 2)     Status: Normal (Preliminary result)   Collection Time   05/26/11  9:47 AM      Component Value Range Status Comment   Specimen Description BLOOD RIGHT ARM DRAWN BY RN   Final    Special Requests BOTTLES DRAWN AEROBIC AND ANAEROBIC 6CC   Final    Culture NO GROWTH 4 DAYS   Final    Report Status PENDING   Incomplete         Medications: I have reviewed the patient's current medications.  Impression: 1. Sickle cell crisis.  2. Anemia related to sickling. Hemoglobin stable at 6.9. 3. Hypertension. 4. Fever with blood cultures negative.     Plan: 1. Reduce intravenous fluids. 2. Continue with current oral and intravenous opioids. 3. I would not transfuse at the present time. She told me that at [facility] she would receive a transfuse when her hemoglobin decreased to 5.     LOS: 5 days   GOSRANI,NIMISH C 05/31/2011, 8:30 AM

## 2011-06-01 LAB — TYPE AND SCREEN
DAT, IgG: NEGATIVE
Unit division: 0

## 2011-06-01 LAB — CBC
Platelets: 301 10*3/uL (ref 150–400)
RBC: 2.24 MIL/uL — ABNORMAL LOW (ref 3.87–5.11)
RDW: 23.9 % — ABNORMAL HIGH (ref 11.5–15.5)
WBC: 9.4 10*3/uL (ref 4.0–10.5)

## 2011-06-01 LAB — COMPREHENSIVE METABOLIC PANEL
ALT: 35 U/L (ref 0–35)
AST: 69 U/L — ABNORMAL HIGH (ref 0–37)
Albumin: 2.8 g/dL — ABNORMAL LOW (ref 3.5–5.2)
CO2: 28 mEq/L (ref 19–32)
Chloride: 100 mEq/L (ref 96–112)
Sodium: 137 mEq/L (ref 135–145)
Total Bilirubin: 4.7 mg/dL — ABNORMAL HIGH (ref 0.3–1.2)

## 2011-06-01 MED ORDER — OXYCODONE HCL 5 MG PO TABS
20.0000 mg | ORAL_TABLET | Freq: Four times a day (QID) | ORAL | Status: DC
  Administered 2011-06-01 – 2011-06-05 (×17): 20 mg via ORAL
  Filled 2011-06-01 (×8): qty 4
  Filled 2011-06-01: qty 3
  Filled 2011-06-01 (×7): qty 4

## 2011-06-01 NOTE — Progress Notes (Signed)
Subjective:  This lady continues to be in pain. She is having to use intravenous hydromorphone on a regular basis.           Physical Exam: Blood pressure 107/60, pulse 120, temperature 102.4 F (39.1 C), temperature source Oral, resp. rate 16, height 5\' 4"  (1.626 m), weight 65.908 kg (145 lb 4.8 oz), last menstrual period 05/22/2011, SpO2 95.00%. She looks to be in some pain. Heart sounds are present and normal lung fields are clear. She is alert and orientated without any focal neurological signs.   Investigations: Results for orders placed during the hospital encounter of 05/26/11 (from the past 48 hour(s))  CBC     Status: Abnormal   Collection Time   05/31/11  6:07 AM      Component Value Range Comment   WBC 6.9  4.0 - 10.5 (K/uL)    RBC 2.16 (*) 3.87 - 5.11 (MIL/uL)    Hemoglobin 6.9 (*) 12.0 - 15.0 (g/dL)    HCT 16.1 (*) 09.6 - 46.0 (%)    MCV 89.8  78.0 - 100.0 (fL)    MCH 31.9  26.0 - 34.0 (pg)    MCHC 35.6  30.0 - 36.0 (g/dL)    RDW 04.5 (*) 40.9 - 15.5 (%)    Platelets 232  150 - 400 (K/uL)   COMPREHENSIVE METABOLIC PANEL     Status: Abnormal   Collection Time   05/31/11  6:07 AM      Component Value Range Comment   Sodium 138  135 - 145 (mEq/L)    Potassium 3.5  3.5 - 5.1 (mEq/L)    Chloride 104  96 - 112 (mEq/L)    CO2 27  19 - 32 (mEq/L)    Glucose, Bld 94  70 - 99 (mg/dL)    BUN 3 (*) 6 - 23 (mg/dL)    Creatinine, Ser <8.11 (*) 0.50 - 1.10 (mg/dL) ICTERUS AT THIS LEVEL MAY AFFECT RESULT   Calcium 8.4  8.4 - 10.5 (mg/dL)    Total Protein 6.5  6.0 - 8.3 (g/dL)    Albumin 2.6 (*) 3.5 - 5.2 (g/dL)    AST 45 (*) 0 - 37 (U/L)    ALT 24  0 - 35 (U/L)    Alkaline Phosphatase 165 (*) 39 - 117 (U/L)    Total Bilirubin 3.1 (*) 0.3 - 1.2 (mg/dL)    GFR calc non Af Amer NOT CALCULATED  >60 (mL/min)    GFR calc Af Amer NOT CALCULATED  >60 (mL/min)   CBC     Status: Abnormal   Collection Time   06/01/11  5:31 AM      Component Value Range Comment   WBC 9.4  4.0 -  10.5 (K/uL)    RBC 2.24 (*) 3.87 - 5.11 (MIL/uL)    Hemoglobin 7.1 (*) 12.0 - 15.0 (g/dL)    HCT 91.4 (*) 78.2 - 46.0 (%)    MCV 92.9  78.0 - 100.0 (fL)    MCH 31.7  26.0 - 34.0 (pg)    MCHC 34.1  30.0 - 36.0 (g/dL)    RDW 95.6 (*) 21.3 - 15.5 (%)    Platelets 301  150 - 400 (K/uL)   COMPREHENSIVE METABOLIC PANEL     Status: Abnormal   Collection Time   06/01/11  5:31 AM      Component Value Range Comment   Sodium 137  135 - 145 (mEq/L)    Potassium 3.7  3.5 - 5.1 (mEq/L)  Chloride 100  96 - 112 (mEq/L)    CO2 28  19 - 32 (mEq/L)    Glucose, Bld 95  70 - 99 (mg/dL)    BUN 6  6 - 23 (mg/dL)    Creatinine, Ser <4.54 (*) 0.50 - 1.10 (mg/dL) ICTERUS AT THIS LEVEL MAY AFFECT RESULT   Calcium 8.5  8.4 - 10.5 (mg/dL)    Total Protein 6.9  6.0 - 8.3 (g/dL)    Albumin 2.8 (*) 3.5 - 5.2 (g/dL)    AST 69 (*) 0 - 37 (U/L)    ALT 35  0 - 35 (U/L)    Alkaline Phosphatase 201 (*) 39 - 117 (U/L)    Total Bilirubin 4.7 (*) 0.3 - 1.2 (mg/dL)    GFR calc non Af Amer NOT CALCULATED  >60 (mL/min)    GFR calc Af Amer NOT CALCULATED  >60 (mL/min)    Recent Results (from the past 240 hour(s))  CULTURE, BLOOD (ROUTINE X 2)     Status: Normal   Collection Time   05/26/11  9:47 AM      Component Value Range Status Comment   Specimen Description BLOOD PORTA CATH DRAWN BY RN   Final    Special Requests BOTTLES DRAWN AEROBIC AND ANAEROBIC 6CC   Final    Culture NO GROWTH 5 DAYS   Final    Report Status 05/31/2011 FINAL   Final   CULTURE, BLOOD (ROUTINE X 2)     Status: Normal   Collection Time   05/26/11  9:47 AM      Component Value Range Status Comment   Specimen Description BLOOD RIGHT ARM DRAWN BY RN   Final    Special Requests BOTTLES DRAWN AEROBIC AND ANAEROBIC 6CC   Final    Culture NO GROWTH 5 DAYS   Final    Report Status 05/31/2011 FINAL   Final         Medications: I have reviewed the patient's current medications.  Impression: 1. Sickle cell crisis.  2. Anemia related to sickling.  Hemoglobin stable at 7.1. 3. Hypertension. 4. Fever with blood cultures negative.     Plan: 1. Increase oxycodone to 20 mg every 4 hours. 2. No blood transfusion.     LOS: 6 days   Garland Smouse C 06/01/2011, 8:16 AM

## 2011-06-02 NOTE — Progress Notes (Signed)
Subjective:  This lady continues to be in pain. Her pain is now 5/10.  She is having to use intravenous hydromorphone on a regular basis. She has not had significant fever in the last 24 hours.           Physical Exam: Blood pressure 101/65, pulse 93, temperature 98.5 F (36.9 C), temperature source Oral, resp. rate 18, height 5\' 4"  (1.626 m), weight 65.908 kg (145 lb 4.8 oz), last menstrual period 05/22/2011, SpO2 100.00%. Heart sounds are present and normal lung fields are clear. She is alert and orientated without any focal neurological signs.   Investigations: Results for orders placed during the hospital encounter of 05/26/11 (from the past 48 hour(s))  CBC     Status: Abnormal   Collection Time   06/01/11  5:31 AM      Component Value Range Comment   WBC 9.4  4.0 - 10.5 (K/uL)    RBC 2.24 (*) 3.87 - 5.11 (MIL/uL)    Hemoglobin 7.1 (*) 12.0 - 15.0 (g/dL)    HCT 01.0 (*) 27.2 - 46.0 (%)    MCV 92.9  78.0 - 100.0 (fL)    MCH 31.7  26.0 - 34.0 (pg)    MCHC 34.1  30.0 - 36.0 (g/dL)    RDW 53.6 (*) 64.4 - 15.5 (%)    Platelets 301  150 - 400 (K/uL)   COMPREHENSIVE METABOLIC PANEL     Status: Abnormal   Collection Time   06/01/11  5:31 AM      Component Value Range Comment   Sodium 137  135 - 145 (mEq/L)    Potassium 3.7  3.5 - 5.1 (mEq/L)    Chloride 100  96 - 112 (mEq/L)    CO2 28  19 - 32 (mEq/L)    Glucose, Bld 95  70 - 99 (mg/dL)    BUN 6  6 - 23 (mg/dL)    Creatinine, Ser <0.34 (*) 0.50 - 1.10 (mg/dL) ICTERUS AT THIS LEVEL MAY AFFECT RESULT   Calcium 8.5  8.4 - 10.5 (mg/dL)    Total Protein 6.9  6.0 - 8.3 (g/dL)    Albumin 2.8 (*) 3.5 - 5.2 (g/dL)    AST 69 (*) 0 - 37 (U/L)    ALT 35  0 - 35 (U/L)    Alkaline Phosphatase 201 (*) 39 - 117 (U/L)    Total Bilirubin 4.7 (*) 0.3 - 1.2 (mg/dL)    GFR calc non Af Amer NOT CALCULATED  >60 (mL/min)    GFR calc Af Amer NOT CALCULATED  >60 (mL/min)    Recent Results (from the past 240 hour(s))  CULTURE, BLOOD (ROUTINE X  2)     Status: Normal   Collection Time   05/26/11  9:47 AM      Component Value Range Status Comment   Specimen Description BLOOD PORTA CATH DRAWN BY RN   Final    Special Requests BOTTLES DRAWN AEROBIC AND ANAEROBIC 6CC   Final    Culture NO GROWTH 5 DAYS   Final    Report Status 05/31/2011 FINAL   Final   CULTURE, BLOOD (ROUTINE X 2)     Status: Normal   Collection Time   05/26/11  9:47 AM      Component Value Range Status Comment   Specimen Description BLOOD RIGHT ARM DRAWN BY RN   Final    Special Requests BOTTLES DRAWN AEROBIC AND ANAEROBIC 6CC   Final    Culture NO GROWTH 5  DAYS   Final    Report Status 05/31/2011 FINAL   Final         Medications: I have reviewed the patient's current medications.  Impression: 1. Sickle cell crisis.  2. Anemia related to sickling.  3. Hypertension. 4. Fever with blood cultures negative.     Plan: 1. Continue current opioid treatment. 2. I think this lady is pain is multifactorial, physical as well as psychological and emotional pain. I have had a long conversation about factors affecting this pain. She seemed to appreciate this conversation.     LOS: 7 days   Forrest Demuro C 06/02/2011, 11:09 AM

## 2011-06-03 LAB — COMPREHENSIVE METABOLIC PANEL
Alkaline Phosphatase: 209 U/L — ABNORMAL HIGH (ref 39–117)
BUN: 4 mg/dL — ABNORMAL LOW (ref 6–23)
Calcium: 8.3 mg/dL — ABNORMAL LOW (ref 8.4–10.5)
Creatinine, Ser: <0.47 mg/dL — ABNORMAL LOW (ref 0.50–1.10)
Glucose, Bld: 118 mg/dL — ABNORMAL HIGH (ref 70–99)
Potassium: 3.1 mEq/L — ABNORMAL LOW (ref 3.5–5.1)
Total Protein: 6.6 g/dL (ref 6.0–8.3)

## 2011-06-03 LAB — CBC
HCT: 16.7 % — ABNORMAL LOW (ref 36.0–46.0)
Hemoglobin: 5.9 g/dL — CL (ref 12.0–15.0)
MCH: 32.2 pg (ref 26.0–34.0)
MCHC: 35.3 g/dL (ref 30.0–36.0)
MCV: 91.3 fL (ref 78.0–100.0)
RDW: 24.5 % — ABNORMAL HIGH (ref 11.5–15.5)

## 2011-06-03 LAB — PREPARE RBC (CROSSMATCH)

## 2011-06-03 MED ORDER — SODIUM CHLORIDE 0.9 % IJ SOLN
INTRAMUSCULAR | Status: AC
  Administered 2011-06-03: 10 mL
  Filled 2011-06-03: qty 10

## 2011-06-03 MED ORDER — SODIUM CHLORIDE 0.9 % IJ SOLN
INTRAMUSCULAR | Status: AC
  Filled 2011-06-03: qty 10

## 2011-06-03 NOTE — Progress Notes (Signed)
Lab called with a critical hemoglobin of 5.9 for patient.  Notified MD of decreased hemoglobin.  Received orders to type and cross two units of prbcs.  Will continue to monitor patient.

## 2011-06-03 NOTE — Progress Notes (Signed)
Subjective:  This lady continues to be in pain. Her pain is now 5/10.  She is having to use intravenous hydromorphone on a regular basis. She has not had significant fever in the last 48 hours. Unfortunately, her hemoglobin has decreased this morning to 5.9.           Physical Exam: Blood pressure 95/57, pulse 90, temperature 97.5 F (36.4 C), temperature source Oral, resp. rate 17, height 5\' 4"  (1.626 m), weight 65.908 kg (145 lb 4.8 oz), last menstrual period 05/22/2011, SpO2 96.00%. Heart sounds are present and normal lung fields are clear. She is alert and orientated without any focal neurological signs.   Investigations: Results for orders placed during the hospital encounter of 05/26/11 (from the past 48 hour(s))  CBC     Status: Abnormal   Collection Time   06/03/11  5:40 AM      Component Value Range Comment   WBC 6.5  4.0 - 10.5 (K/uL) WHITE COUNT CONFIRMED ON SMEAR   RBC 1.83 (*) 3.87 - 5.11 (MIL/uL)    Hemoglobin 5.9 (*) 12.0 - 15.0 (g/dL)    HCT 16.1 (*) 09.6 - 46.0 (%)    MCV 91.3  78.0 - 100.0 (fL)    MCH 32.2  26.0 - 34.0 (pg)    MCHC 35.3  30.0 - 36.0 (g/dL)    RDW 04.5 (*) 40.9 - 15.5 (%)    Platelets 248  150 - 400 (K/uL) PLATELET COUNT CONFIRMED BY SMEAR  COMPREHENSIVE METABOLIC PANEL     Status: Abnormal   Collection Time   06/03/11  5:40 AM      Component Value Range Comment   Sodium 136  135 - 145 (mEq/L)    Potassium 3.1 (*) 3.5 - 5.1 (mEq/L)    Chloride 103  96 - 112 (mEq/L)    CO2 28  19 - 32 (mEq/L)    Glucose, Bld 118 (*) 70 - 99 (mg/dL)    BUN 4 (*) 6 - 23 (mg/dL)    Creatinine, Ser <8.11 (*) 0.50 - 1.10 (mg/dL) ICTERUS AT THIS LEVEL MAY AFFECT RESULT   Calcium 8.3 (*) 8.4 - 10.5 (mg/dL)    Total Protein 6.6  6.0 - 8.3 (g/dL)    Albumin 2.6 (*) 3.5 - 5.2 (g/dL)    AST 62 (*) 0 - 37 (U/L)    ALT 37 (*) 0 - 35 (U/L)    Alkaline Phosphatase 209 (*) 39 - 117 (U/L)    Total Bilirubin 2.6 (*) 0.3 - 1.2 (mg/dL)    GFR calc non Af Amer NOT CALCULATED   >60 (mL/min)    GFR calc Af Amer NOT CALCULATED  >60 (mL/min)   PREPARE RBC (CROSSMATCH)     Status: Normal   Collection Time   06/03/11  6:30 AM      Component Value Range Comment   Order Confirmation ORDER PROCESSED BY BLOOD BANK     TYPE AND SCREEN     Status: Normal   Collection Time   06/03/11  6:30 AM      Component Value Range Comment   ABO/RH(D) A POS      Antibody Screen POS      Sample Expiration 06/06/2011      Recent Results (from the past 240 hour(s))  CULTURE, BLOOD (ROUTINE X 2)     Status: Normal   Collection Time   05/26/11  9:47 AM      Component Value Range Status Comment   Specimen Description  BLOOD PORTA CATH DRAWN BY RN   Final    Special Requests BOTTLES DRAWN AEROBIC AND ANAEROBIC 6CC   Final    Culture NO GROWTH 5 DAYS   Final    Report Status 05/31/2011 FINAL   Final   CULTURE, BLOOD (ROUTINE X 2)     Status: Normal   Collection Time   05/26/11  9:47 AM      Component Value Range Status Comment   Specimen Description BLOOD RIGHT ARM DRAWN BY RN   Final    Special Requests BOTTLES DRAWN AEROBIC AND ANAEROBIC 6CC   Final    Culture NO GROWTH 5 DAYS   Final    Report Status 05/31/2011 FINAL   Final         Medications: I have reviewed the patient's current medications.  Impression: 1. Sickle cell crisis.  2. Anemia related to sickling.  3. Hypertension.      Plan: 1. Continue current opioid treatment. 2. Transfuse 2 units of blood.     LOS: 8 days   Curlie Macken C 06/03/2011, 10:13 AM

## 2011-06-04 LAB — COMPREHENSIVE METABOLIC PANEL
ALT: 45 U/L — ABNORMAL HIGH (ref 0–35)
AST: 63 U/L — ABNORMAL HIGH (ref 0–37)
Alkaline Phosphatase: 251 U/L — ABNORMAL HIGH (ref 39–117)
CO2: 27 mEq/L (ref 19–32)
Chloride: 102 mEq/L (ref 96–112)
Sodium: 136 mEq/L (ref 135–145)
Total Bilirubin: 2.6 mg/dL — ABNORMAL HIGH (ref 0.3–1.2)

## 2011-06-04 LAB — CBC
HCT: 18.5 % — ABNORMAL LOW (ref 36.0–46.0)
MCV: 90.7 fL (ref 78.0–100.0)
RBC: 2.04 MIL/uL — ABNORMAL LOW (ref 3.87–5.11)
WBC: 9.5 10*3/uL (ref 4.0–10.5)

## 2011-06-04 LAB — RETICULOCYTES: Retic Count, Absolute: 240.7 10*3/uL — ABNORMAL HIGH (ref 19.0–186.0)

## 2011-06-04 NOTE — Progress Notes (Signed)
Subjective:  This lady continues to be in pain. Her pain is better. Unfortunately, she didn't not receive any blood transfusion yesterday because of matching problems. Her hemoglobin this morning is 6.6. She says she feels better and her overall pain is better and is considering that she would be ready to go home tomorrow.           Physical Exam: Blood pressure 104/36, pulse 97, temperature 98.7 F (37.1 C), temperature source Oral, resp. rate 16, height 5\' 4"  (1.626 m), weight 65.908 kg (145 lb 4.8 oz), last menstrual period 05/22/2011, SpO2 0.00%. Heart sounds are present and normal lung fields are clear. She is alert and orientated without any focal neurological signs.   Investigations: Results for orders placed during the hospital encounter of 05/26/11 (from the past 48 hour(s))  CBC     Status: Abnormal   Collection Time   06/03/11  5:40 AM      Component Value Range Comment   WBC 6.5  4.0 - 10.5 (K/uL) WHITE COUNT CONFIRMED ON SMEAR   RBC 1.83 (*) 3.87 - 5.11 (MIL/uL)    Hemoglobin 5.9 (*) 12.0 - 15.0 (g/dL)    HCT 64.4 (*) 03.4 - 46.0 (%)    MCV 91.3  78.0 - 100.0 (fL)    MCH 32.2  26.0 - 34.0 (pg)    MCHC 35.3  30.0 - 36.0 (g/dL)    RDW 74.2 (*) 59.5 - 15.5 (%)    Platelets 248  150 - 400 (K/uL) PLATELET COUNT CONFIRMED BY SMEAR  COMPREHENSIVE METABOLIC PANEL     Status: Abnormal   Collection Time   06/03/11  5:40 AM      Component Value Range Comment   Sodium 136  135 - 145 (mEq/L)    Potassium 3.1 (*) 3.5 - 5.1 (mEq/L)    Chloride 103  96 - 112 (mEq/L)    CO2 28  19 - 32 (mEq/L)    Glucose, Bld 118 (*) 70 - 99 (mg/dL)    BUN 4 (*) 6 - 23 (mg/dL)    Creatinine, Ser <6.38 (*) 0.50 - 1.10 (mg/dL) ICTERUS AT THIS LEVEL MAY AFFECT RESULT   Calcium 8.3 (*) 8.4 - 10.5 (mg/dL)    Total Protein 6.6  6.0 - 8.3 (g/dL)    Albumin 2.6 (*) 3.5 - 5.2 (g/dL)    AST 62 (*) 0 - 37 (U/L)    ALT 37 (*) 0 - 35 (U/L)    Alkaline Phosphatase 209 (*) 39 - 117 (U/L)    Total Bilirubin  2.6 (*) 0.3 - 1.2 (mg/dL)    GFR calc non Af Amer NOT CALCULATED  >60 (mL/min)    GFR calc Af Amer NOT CALCULATED  >60 (mL/min)   PREPARE RBC (CROSSMATCH)     Status: Normal   Collection Time   06/03/11  6:30 AM      Component Value Range Comment   Order Confirmation ORDER PROCESSED BY BLOOD BANK     TYPE AND SCREEN     Status: Normal   Collection Time   06/03/11  6:30 AM      Component Value Range Comment   ABO/RH(D) A POS      Antibody Screen POS      Sample Expiration 06/06/2011      Antibody Identification ANTI-E ANTI-Jsa (SUTTER)      DAT, IgG NEG     CBC     Status: Abnormal   Collection Time   06/04/11  4:55 AM  Component Value Range Comment   WBC 9.5  4.0 - 10.5 (K/uL)    RBC 2.04 (*) 3.87 - 5.11 (MIL/uL)    Hemoglobin 6.6 (*) 12.0 - 15.0 (g/dL)    HCT 40.9 (*) 81.1 - 46.0 (%)    MCV 90.7  78.0 - 100.0 (fL)    MCH 32.4  26.0 - 34.0 (pg)    MCHC 35.7  30.0 - 36.0 (g/dL)    RDW 91.4 (*) 78.2 - 15.5 (%)    Platelets 360  150 - 400 (K/uL) DELTA CHECK NOTED  RETICULOCYTES     Status: Abnormal   Collection Time   06/04/11  4:55 AM      Component Value Range Comment   Retic Ct Pct 11.8 (*) 0.4 - 3.1 (%)    RBC. 2.04 (*) 3.87 - 5.11 (MIL/uL)    Retic Count, Manual 240.7 (*) 19.0 - 186.0 (K/uL)   COMPREHENSIVE METABOLIC PANEL     Status: Abnormal   Collection Time   06/04/11  4:55 AM      Component Value Range Comment   Sodium 136  135 - 145 (mEq/L)    Potassium 3.3 (*) 3.5 - 5.1 (mEq/L)    Chloride 102  96 - 112 (mEq/L)    CO2 27  19 - 32 (mEq/L)    Glucose, Bld 94  70 - 99 (mg/dL)    BUN 4 (*) 6 - 23 (mg/dL)    Creatinine, Ser <9.56 (*) 0.50 - 1.10 (mg/dL) ICTERUS AT THIS LEVEL MAY AFFECT RESULT   Calcium 8.6  8.4 - 10.5 (mg/dL)    Total Protein 7.0  6.0 - 8.3 (g/dL)    Albumin 3.0 (*) 3.5 - 5.2 (g/dL)    AST 63 (*) 0 - 37 (U/L)    ALT 45 (*) 0 - 35 (U/L)    Alkaline Phosphatase 251 (*) 39 - 117 (U/L)    Total Bilirubin 2.6 (*) 0.3 - 1.2 (mg/dL)    GFR calc non Af  Amer NOT CALCULATED  >60 (mL/min)    GFR calc Af Amer NOT CALCULATED  >60 (mL/min)    Recent Results (from the past 240 hour(s))  CULTURE, BLOOD (ROUTINE X 2)     Status: Normal   Collection Time   05/26/11  9:47 AM      Component Value Range Status Comment   Specimen Description BLOOD PORTA CATH DRAWN BY RN   Final    Special Requests BOTTLES DRAWN AEROBIC AND ANAEROBIC 6CC   Final    Culture NO GROWTH 5 DAYS   Final    Report Status 05/31/2011 FINAL   Final   CULTURE, BLOOD (ROUTINE X 2)     Status: Normal   Collection Time   05/26/11  9:47 AM      Component Value Range Status Comment   Specimen Description BLOOD RIGHT ARM DRAWN BY RN   Final    Special Requests BOTTLES DRAWN AEROBIC AND ANAEROBIC 6CC   Final    Culture NO GROWTH 5 DAYS   Final    Report Status 05/31/2011 FINAL   Final         Medications: I have reviewed the patient's current medications.  Impression: 1. Sickle cell crisis.  2. Anemia related to sickling.  3. Hypertension.      Plan: 1. Continue current opioid treatment. 2. Transfuse 2 units of blood today.     LOS: 9 days   Caillou Minus C 06/04/2011, 11:54 AM

## 2011-06-05 MED ORDER — ALPRAZOLAM 0.5 MG PO TABS: 0.5000 mg | ORAL_TABLET | Freq: Three times a day (TID) | ORAL | Status: AC | PRN

## 2011-06-05 MED ORDER — HEPARIN (PORCINE) LOCK FLUSH 10 UNIT/ML IV SOLN
10.0000 [IU] | Freq: Once | INTRAVENOUS | Status: DC
  Filled 2011-06-05: qty 1

## 2011-06-05 MED ORDER — SENNOSIDES-DOCUSATE SODIUM 8.6-50 MG PO TABS: 1.0000 | ORAL_TABLET | Freq: Every day | ORAL | Status: DC

## 2011-06-05 MED ORDER — OXYCODONE HCL 5 MG PO CAPS: 10.0000 mg | ORAL_CAPSULE | ORAL | Status: DC | PRN

## 2011-06-05 MED ORDER — HEPARIN SOD (PORK) LOCK FLUSH 100 UNIT/ML IV SOLN
500.0000 [IU] | Freq: Once | INTRAVENOUS | Status: AC
  Administered 2011-06-05: 500 [IU] via INTRAVENOUS
  Filled 2011-06-05: qty 5

## 2011-06-05 NOTE — Discharge Summary (Signed)
Physician Discharge Summary  Patient ID: Dawn [name] MRN: [id number] DOB/AGE: [birth year] 30 y.o.  Admit date: 05/26/2011 Discharge date: 06/05/2011    Discharge Diagnoses:  1. Sickle cell crisis. 2. Anemia related to sickle cell crisis, status post 3 units blood transfusion. 3. Hypertension.   Current Discharge Medication List    START taking these medications   Details  ALPRAZolam (XANAX) 0.5 MG tablet Take 1 tablet (0.5 mg total) by mouth 3 (three) times daily as needed for anxiety. Qty: 30 tablet, Refills: 0    senna-docusate (SENOKOT-S) 8.6-50 MG per tablet Take 1 tablet by mouth at bedtime. Qty: 30 tablet, Refills: 0      CONTINUE these medications which have CHANGED   Details  oxycodone (OXY-IR) 5 MG capsule Take 2 capsules (10 mg total) by mouth every 4 (four) hours as needed. For pain Qty: 60 capsule, Refills: 0      CONTINUE these medications which have NOT CHANGED   Details  cetirizine (ZYRTEC) 10 MG tablet Take 10 mg by mouth daily as needed. For allergies     folic acid (FOLVITE) 1 MG tablet Take 1 mg by mouth daily.      hydroxyurea (HYDREA) 500 MG capsule Take 500-1,000 mg by mouth as directed. Take 500mg  (1 tablet) po on all ODD days (Mondays, Wednesdays, Fridays, Sundays) and take 1000mg  (2 Tablets) on all EVEN days (Tuesdays, Thursdays, Saturdays).  May take with food to minimize GI side effects.    losartan (COZAAR) 25 MG tablet Take 25 mg by mouth daily.          Discharged Condition: Stable.    Consults: None.  Significant Diagnostic Studies: Dg Chest 2 View  05/26/2011  *RADIOLOGY REPORT*  Clinical Data: Sickle cell crisis with back and hip pain and fever.  CHEST - 2 VIEW  Comparison: Chest radiograph 04/27/2011.  Chest CT 11/14/2010.  Findings: Right IJ power port terminates in the proximal right atrium.  Heart size within normal limits.  Mediastinal hilar contours normal.  Normal pulmonary vascularity.  The lungs are clear.  There is no pleural  effusion or pneumothorax. Cholecystectomy clips.  No acute bony abnormality is identified. 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  There is some cystic changes in the right humeral head, which likely reflect AVN.  IMPRESSION:  1.  No acute cardiopulmonary disease. 2. 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  Original Report Authenticated By: Britta Mccreedy, M.D.    Lab Results: Results for orders placed during the hospital encounter of 05/26/11 (from the past 48 hour(s))  CBC     Status: Abnormal   Collection Time   06/04/11  4:55 AM      Component Value Range Comment   WBC 9.5  4.0 - 10.5 (K/uL)    RBC 2.04 (*) 3.87 - 5.11 (MIL/uL)    Hemoglobin 6.6 (*) 12.0 - 15.0 (g/dL)    HCT 40.9 (*) 81.1 - 46.0 (%)    MCV 90.7  78.0 - 100.0 (fL)    MCH 32.4  26.0 - 34.0 (pg)    MCHC 35.7  30.0 - 36.0 (g/dL)    RDW 91.4 (*) 78.2 - 15.5 (%)    Platelets 360  150 - 400 (K/uL) DELTA CHECK NOTED  RETICULOCYTES     Status: Abnormal   Collection Time   06/04/11  4:55 AM      Component Value Range Comment  Retic Ct Pct 11.8 (*) 0.4 - 3.1 (%)    RBC. 2.04 (*) 3.87 - 5.11 (MIL/uL)    Retic Count, Manual 240.7 (*) 19.0 - 186.0 (K/uL)   COMPREHENSIVE METABOLIC PANEL     Status: Abnormal   Collection Time   06/04/11  4:55 AM      Component Value Range Comment   Sodium 136  135 - 145 (mEq/L)    Potassium 3.3 (*) 3.5 - 5.1 (mEq/L)    Chloride 102  96 - 112 (mEq/L)    CO2 27  19 - 32 (mEq/L)    Glucose, Bld 94  70 - 99 (mg/dL)    BUN 4 (*) 6 - 23 (mg/dL)    Creatinine, Ser <1.61 (*) 0.50 - 1.10 (mg/dL) ICTERUS AT THIS LEVEL MAY AFFECT RESULT   Calcium 8.6  8.4 - 10.5 (mg/dL)    Total Protein 7.0  6.0 - 8.3 (g/dL)    Albumin 3.0 (*) 3.5 - 5.2 (g/dL)    AST 63 (*) 0 - 37 (U/L)    ALT 45 (*) 0 - 35 (U/L)    Alkaline Phosphatase 251 (*) 39 - 117 (U/L)    Total Bilirubin 2.6 (*) 0.3 - 1.2 (mg/dL)      GFR calc non Af Amer NOT CALCULATED  >60 (mL/min)    GFR calc Af Amer NOT CALCULATED  >60 (mL/min)       Hospital Course: This 30 year old lady presented once again with a sickle cell crisis. Please see initial history and physical examination done by Dr. Ardyth Harps. This is one of several admissions to this hospital in this year alone. She was treated with intravenous fluids and analgesia and oxygen. She degrees and he well but she was slow to improve. She required one unit of blood transfusion a few days ago and then her hemoglobin has decreased again requiring further 2 units of blood transfusion yesterday. We do not have a post blood transfusion hemoglobin this morning. She feels well, improved from her pain although still does have some pain. I did have a long discussion with her regarding her pain which I believe is not only just physical. She has had many challenges in her life and this year and I believe that her pain is physical, psychological and emotional and I did discuss with her ways in which this could be helped.  Discharge Exam: Blood pressure 111/73, pulse 82, temperature 97.7 F (36.5 C), temperature source Oral, resp. rate 18, height 5\' 4"  (1.626 m), weight 65.908 kg (145 lb 4.8 oz), last menstrual period 05/22/2011, SpO2 96.00%. She looks systemically well. Heart sounds are present and normal. Lung fields are clear. She is alert and orientated without any focal neurological signs.  Disposition: Home. She knows that she will call her specialist at [facility] for sickle cell disease and get an appointment within a week or so.  Discharge Orders    Future Orders Please Complete By Expires   Diet - low sodium heart healthy      Increase activity slowly      Discharge instructions      Comments:   Make sure you followup with [facility] within one week.        SignedWilson Singer 06/05/2011, 10:22 AM

## 2011-06-05 NOTE — Progress Notes (Signed)
[facility] SURGICAL UNIT [address] [place] [address]  June 05, 2011  Patient: Dawn [name]  Date of Birth: [birth year]  Date of Visit: 05/26/2011    To Whom It May Concern:  Arshi Kant was seen and treated in our Hospital on 05/26/2011 to 06/05/11.  If you have any questions or concerns, please don't hesitate to call.  Sincerely,

## 2011-06-05 NOTE — Progress Notes (Signed)
Pt discharged home today per Dr. Karilyn Cota. Pt's IV site D/C'd and WNL. Pt's VS stable at this time. Pt provided with home medication list, discharge instructions, prescriptions, and excuse from school. Pt verbalized understanding. Pt left floor via WC accompanied by Dagoberto Ligas, RN in stable condition.

## 2011-06-06 LAB — TYPE AND SCREEN
ABO/RH(D): A POS
Unit division: 0
Unit division: 0

## 2011-09-24 ENCOUNTER — Other Ambulatory Visit: Payer: Self-pay

## 2011-09-24 ENCOUNTER — Encounter (HOSPITAL_COMMUNITY): Payer: Self-pay

## 2011-09-24 ENCOUNTER — Emergency Department (HOSPITAL_COMMUNITY): Payer: Medicare Other

## 2011-09-24 ENCOUNTER — Emergency Department (HOSPITAL_COMMUNITY)
Admission: EM | Admit: 2011-09-24 | Discharge: 2011-09-24 | Disposition: A | Discharge status: Other Acute Inpatient Hospital | Payer: Medicare Other | Attending: Emergency Medicine | Admitting: Emergency Medicine

## 2011-09-24 DIAGNOSIS — R0902 Hypoxemia: Secondary | ICD-10-CM | POA: Diagnosis not present

## 2011-09-24 DIAGNOSIS — Z452 Encounter for adjustment and management of vascular access device: Secondary | ICD-10-CM | POA: Diagnosis not present

## 2011-09-24 DIAGNOSIS — R0602 Shortness of breath: Secondary | ICD-10-CM | POA: Insufficient documentation

## 2011-09-24 DIAGNOSIS — J189 Pneumonia, unspecified organism: Secondary | ICD-10-CM | POA: Insufficient documentation

## 2011-09-24 DIAGNOSIS — J9600 Acute respiratory failure, unspecified whether with hypoxia or hypercapnia: Secondary | ICD-10-CM | POA: Diagnosis present

## 2011-09-24 DIAGNOSIS — A419 Sepsis, unspecified organism: Secondary | ICD-10-CM | POA: Diagnosis not present

## 2011-09-24 DIAGNOSIS — I517 Cardiomegaly: Secondary | ICD-10-CM | POA: Diagnosis not present

## 2011-09-24 DIAGNOSIS — R079 Chest pain, unspecified: Secondary | ICD-10-CM | POA: Diagnosis not present

## 2011-09-24 DIAGNOSIS — D571 Sickle-cell disease without crisis: Secondary | ICD-10-CM | POA: Diagnosis not present

## 2011-09-24 DIAGNOSIS — J159 Unspecified bacterial pneumonia: Secondary | ICD-10-CM | POA: Diagnosis not present

## 2011-09-24 DIAGNOSIS — R809 Proteinuria, unspecified: Secondary | ICD-10-CM | POA: Diagnosis present

## 2011-09-24 DIAGNOSIS — D573 Sickle-cell trait: Secondary | ICD-10-CM | POA: Diagnosis not present

## 2011-09-24 DIAGNOSIS — M87029 Idiopathic aseptic necrosis of unspecified humerus: Secondary | ICD-10-CM | POA: Diagnosis present

## 2011-09-24 DIAGNOSIS — D5700 Hb-SS disease with crisis, unspecified: Secondary | ICD-10-CM | POA: Diagnosis not present

## 2011-09-24 DIAGNOSIS — R918 Other nonspecific abnormal finding of lung field: Secondary | ICD-10-CM | POA: Diagnosis not present

## 2011-09-24 DIAGNOSIS — D5701 Hb-SS disease with acute chest syndrome: Secondary | ICD-10-CM | POA: Diagnosis not present

## 2011-09-24 DIAGNOSIS — R000 Tachycardia, unspecified: Secondary | ICD-10-CM | POA: Diagnosis present

## 2011-09-24 DIAGNOSIS — I498 Other specified cardiac arrhythmias: Secondary | ICD-10-CM | POA: Insufficient documentation

## 2011-09-24 DIAGNOSIS — M7500 Adhesive capsulitis of unspecified shoulder: Secondary | ICD-10-CM | POA: Diagnosis present

## 2011-09-24 LAB — URINALYSIS, ROUTINE W REFLEX MICROSCOPIC
Glucose, UA: NEGATIVE mg/dL
Ketones, ur: NEGATIVE mg/dL
Leukocytes, UA: NEGATIVE
Specific Gravity, Urine: 1.025 (ref 1.005–1.030)
pH: 6 (ref 5.0–8.0)

## 2011-09-24 LAB — DIFFERENTIAL
Basophils Absolute: 0 10*3/uL (ref 0.0–0.1)
Basophils Relative: 0 % (ref 0–1)
Myelocytes: 0 %
Neutro Abs: 10.8 10*3/uL — ABNORMAL HIGH (ref 1.7–7.7)
Neutrophils Relative %: 72 % (ref 43–77)
Promyelocytes Absolute: 0 %
nRBC: 34 /100 WBC — ABNORMAL HIGH

## 2011-09-24 LAB — BASIC METABOLIC PANEL
CO2: 24 mEq/L (ref 19–32)
Chloride: 100 mEq/L (ref 96–112)
Creatinine, Ser: 0.57 mg/dL (ref 0.50–1.10)

## 2011-09-24 LAB — CBC
MCH: 35.7 pg — ABNORMAL HIGH (ref 26.0–34.0)
MCHC: 36.2 g/dL — ABNORMAL HIGH (ref 30.0–36.0)
Platelets: 267 10*3/uL (ref 150–400)

## 2011-09-24 MED ORDER — HYDROMORPHONE HCL PF 2 MG/ML IJ SOLN
INTRAMUSCULAR | Status: AC
  Administered 2011-09-24: 2 mg
  Filled 2011-09-24: qty 1

## 2011-09-24 MED ORDER — HYDROMORPHONE HCL PF 1 MG/ML IJ SOLN
1.0000 mg | Freq: Once | INTRAMUSCULAR | Status: AC
  Administered 2011-09-24: 1 mg via INTRAVENOUS
  Filled 2011-09-24: qty 1

## 2011-09-24 MED ORDER — ONDANSETRON HCL 4 MG/2ML IJ SOLN
INTRAMUSCULAR | Status: AC
  Administered 2011-09-24: 4 mg via INTRAVENOUS
  Filled 2011-09-24: qty 2

## 2011-09-24 MED ORDER — SODIUM CHLORIDE 0.9 % IV BOLUS (SEPSIS)
1000.0000 mL | Freq: Once | INTRAVENOUS | Status: AC
  Administered 2011-09-24: 1000 mL via INTRAVENOUS

## 2011-09-24 MED ORDER — IBUPROFEN 800 MG PO TABS
ORAL_TABLET | ORAL | Status: AC
  Administered 2011-09-24: 800 mg
  Filled 2011-09-24: qty 1

## 2011-09-24 MED ORDER — DEXTROSE 5 % IV SOLN
1.0000 g | Freq: Once | INTRAVENOUS | Status: AC
  Administered 2011-09-24: 1 g via INTRAVENOUS
  Filled 2011-09-24: qty 10

## 2011-09-24 MED ORDER — ONDANSETRON HCL 4 MG/2ML IJ SOLN
INTRAMUSCULAR | Status: AC
  Administered 2011-09-24: 4 mg
  Filled 2011-09-24: qty 2

## 2011-09-24 MED ORDER — HYDROMORPHONE HCL PF 1 MG/ML IJ SOLN
2.0000 mg | INTRAMUSCULAR | Status: DC | PRN
  Administered 2011-09-24: 1 mg via INTRAVENOUS
  Administered 2011-09-24: 2 mg via INTRAVENOUS
  Filled 2011-09-24: qty 2
  Filled 2011-09-24: qty 1

## 2011-09-24 MED ORDER — ACETAMINOPHEN 500 MG PO TABS
1000.0000 mg | ORAL_TABLET | Freq: Once | ORAL | Status: AC
  Administered 2011-09-24: 1000 mg via ORAL
  Filled 2011-09-24: qty 2

## 2011-09-24 MED ORDER — DEXTROSE 5 % IV SOLN
500.0000 mg | Freq: Once | INTRAVENOUS | Status: AC
  Administered 2011-09-24: 500 mg via INTRAVENOUS
  Filled 2011-09-24: qty 500

## 2011-09-24 MED ORDER — ONDANSETRON HCL 4 MG/2ML IJ SOLN
4.0000 mg | Freq: Four times a day (QID) | INTRAMUSCULAR | Status: DC | PRN
  Administered 2011-09-24: 4 mg via INTRAVENOUS

## 2011-09-24 NOTE — ED Notes (Signed)
Spoke with dr. Read Drivers regarding patient's request for more pain medication. Dr. Read Drivers stated that patient could receive 1 mg of dilaudid. States she has a prn order for 2 mg of dilaudid q2h, and he was notified that her last 1 mg administration of dilaudid was at 0409.

## 2011-09-24 NOTE — ED Provider Notes (Addendum)
History     CSN: [id number]  Arrival date & time 09/24/11  0139   First MD Initiated Contact with Patient 09/24/11 0359      Chief Complaint  Patient presents with  . Sickle Cell Pain Crisis    (Consider location/radiation/quality/duration/timing/severity/associated sxs/prior treatment) HPI This is a 30 year old black female with history of sickle cell disease. She began feeling ill yesterday afternoon about 2 PM. She developed generalized pain consistent with her sickle cell crises. She subsequently developed right lower chest pain which she describes as a stabbing pain or an elephant sitting on her chest. It radiates to her back. This pain is severe and worse with movement or breathing. She has also developed shortness of breath, fever and malaise. She was noted to be febrile and tachycardic on arrival in the ED. She was given ibuprofen by a nurse per protocol.  Past Medical History  Diagnosis Date  . Sickle cell disease, type S   . Blood transfusion     Past Surgical History  Procedure Date  . Cholecystectomy   .  left knee acl reconstruction   . Cesarean section     x 2  . Portacath placement     No family history on file.  History  Substance Use Topics  . Smoking status: Never Smoker   . Smokeless tobacco: Not on file  . Alcohol Use: No    OB History    Grav Para Term Preterm Abortions TAB SAB Ect Mult Living                  Review of Systems  All other systems reviewed and are negative.    Allergies  Latex; Lisinopril; Tape; and Tylenol  Home Medications   Current Outpatient Rx  Name Route Sig Dispense Refill  . CETIRIZINE HCL 10 MG PO TABS Oral Take 10 mg by mouth daily as needed. For allergies     . FOLIC ACID 1 MG PO TABS Oral Take 1 mg by mouth daily.      Marland Kitchen HYDROXYUREA 500 MG PO CAPS Oral Take 500-1,000 mg by mouth as directed. Take 500mg  (1 tablet) po on all ODD days (Mondays, Wednesdays, Fridays, Sundays) and take 1000mg  (2 Tablets) on all  EVEN days (Tuesdays, Thursdays, Saturdays).  May take with food to minimize GI side effects.    Marland Kitchen LOSARTAN POTASSIUM 25 MG PO TABS Oral Take 25 mg by mouth daily.      . OXYCODONE HCL 5 MG PO CAPS Oral Take 2 capsules (10 mg total) by mouth every 4 (four) hours as needed. For pain 60 capsule 0  . SENNOSIDES-DOCUSATE SODIUM 8.6-50 MG PO TABS Oral Take 1 tablet by mouth at bedtime. 30 tablet 0    BP 111/51  Pulse 115  Temp(Src) 99 F (37.2 C) (Oral)  Resp 16  Ht 5\' 3"  (1.6 m)  Wt 145 lb (65.772 kg)  BMI 25.69 kg/m2  SpO2 97%  LMP 09/03/2011  Physical Exam General: Well-developed, well-nourished female in no acute distress; appearance consistent with age of record; appears uncomfortable HENT: normocephalic, atraumatic Eyes: pupils equal round and reactive to light; extraocular muscles intact; scleral icterus Neck: supple Heart: regular rate and rhythm; tachycardia Lungs: decreased sounds right base Abdomen: soft; nontender; nondistended Extremities: No deformity; full range of motion; pulses normal Neurologic: Awake, alert and oriented; motor function intact in all extremities and symmetric; no facial droop Skin: Warm and dry Psychiatric: tearful; anxious    ED Course  Procedures (including  critical care time)    MDM  EKG Interpretation:  Date & Time: 09/24/2011 3:05 AM  Rate: 160  Rhythm: sinus tachycardia  QRS Axis: normal  Intervals: normal  ST/T Wave abnormalities: nonspecific ST/T changes  Conduction Disutrbances:none  Narrative Interpretation:   Old EKG Reviewed: rate is faster  Nursing notes and vitals signs, including pulse oximetry, reviewed.  Summary of this visit's results, reviewed by myself:  Labs:  Results for orders placed during the hospital encounter of 09/24/11  CBC      Component Value Range   WBC 14.6 (*) 4.0 - 10.5 (K/uL)   RBC 2.13 (*) 3.87 - 5.11 (MIL/uL)   Hemoglobin 7.6 (*) 12.0 - 15.0 (g/dL)   HCT 16.1 (*) 09.6 - 46.0 (%)   MCV 98.6   78.0 - 100.0 (fL)   MCH 35.7 (*) 26.0 - 34.0 (pg)   MCHC 36.2 (*) 30.0 - 36.0 (g/dL)   RDW 04.5 (*) 40.9 - 15.5 (%)   Platelets 267  150 - 400 (K/uL)  DIFFERENTIAL      Component Value Range   Neutrophils Relative 72  43 - 77 (%)   Lymphocytes Relative 20  12 - 46 (%)   Monocytes Relative 6  3 - 12 (%)   Eosinophils Relative 0  0 - 5 (%)   Basophils Relative 0  0 - 1 (%)   Band Neutrophils 2  0 - 10 (%)   Metamyelocytes Relative 0     Myelocytes 0     Promyelocytes Absolute 0     Blasts 0     nRBC 34 (*) 0 (/100 WBC)   Neutro Abs 10.8 (*) 1.7 - 7.7 (K/uL)   Lymphs Abs 2.9  0.7 - 4.0 (K/uL)   Monocytes Absolute 0.9  0.1 - 1.0 (K/uL)   Eosinophils Absolute 0.0  0.0 - 0.7 (K/uL)   Basophils Absolute 0.0  0.0 - 0.1 (K/uL)  BASIC METABOLIC PANEL      Component Value Range   Sodium 135  135 - 145 (mEq/L)   Potassium 3.4 (*) 3.5 - 5.1 (mEq/L)   Chloride 100  96 - 112 (mEq/L)   CO2 24  19 - 32 (mEq/L)   Glucose, Bld 140 (*) 70 - 99 (mg/dL)   BUN 5 (*) 6 - 23 (mg/dL)   Creatinine, Ser 8.11  0.50 - 1.10 (mg/dL)   Calcium 8.6  8.4 - 91.4 (mg/dL)   GFR calc non Af Amer >90  >90 (mL/min)   GFR calc Af Amer >90  >90 (mL/min)  URINALYSIS, ROUTINE W REFLEX MICROSCOPIC      Component Value Range   Color, Urine AMBER (*) YELLOW    APPearance CLEAR  CLEAR    Specific Gravity, Urine 1.025  1.005 - 1.030    pH 6.0  5.0 - 8.0    Glucose, UA NEGATIVE  NEGATIVE (mg/dL)   Hgb urine dipstick LARGE (*) NEGATIVE    Bilirubin Urine MODERATE (*) NEGATIVE    Ketones, ur NEGATIVE  NEGATIVE (mg/dL)   Protein, ur >782 (*) NEGATIVE (mg/dL)   Urobilinogen, UA 4.0 (*) 0.0 - 1.0 (mg/dL)   Nitrite NEGATIVE  NEGATIVE    Leukocytes, UA NEGATIVE  NEGATIVE   CULTURE, BLOOD (ROUTINE X 2)      Component Value Range   Specimen Description BLOOD PORTA CATH     Special Requests       Value: BOTTLES DRAWN AEROBIC AND ANAEROBIC 7CC DRAWN BY RN   Culture PENDING  Report Status PENDING    CULTURE, BLOOD  (ROUTINE X 2)      Component Value Range   Specimen Description BLOOD BLOOD LEFT HAND     Special Requests BOTTLES DRAWN AEROBIC ONLY 5CC     Culture PENDING     Report Status PENDING    URINE MICROSCOPIC-ADD ON      Component Value Range   Squamous Epithelial / LPF FEW (*) RARE    RBC / HPF 7-10  <3 (RBC/hpf)  TROPONIN I      Component Value Range   Troponin I <0.30  <0.30 (ng/mL)    Imaging Studies: Dg Chest Port 1 View  09/24/2011  *RADIOLOGY REPORT*  Clinical Data: High shortness of breath and chest pain.  History of sickle cell disease.  PORTABLE CHEST - 1 VIEW  Comparison: 05/26/2011  Findings: Interval development of focal rounded area of infiltration in the right mid lung suggesting pneumonia.  Shallow inspiration.  Normal heart size and pulmonary vascularity.  Right central venous catheter with tip over the lower SVC, unchanged.  No blunting of costophrenic angles.  No pneumothorax.  IMPRESSION: Interval development of rounded area of infiltration in the right mid lung.  Original Report Authenticated By: Marlon Pel, M.D.   6:20 AM Rocephin and Zithromax ordered. Will have patient admitted.  6:20 AM Will transfer to [facility], Dr. Laurell Josephs accepting.            Hanley Seamen, MD 09/24/11 0413  Hanley Seamen, MD 09/24/11 1610  Hanley Seamen, MD 09/24/11 [phone]

## 2011-09-24 NOTE — ED Notes (Signed)
edp in with patient 

## 2011-09-24 NOTE — ED Notes (Signed)
[facility] wants pt transferred to their ED so they can assess pt further to decide which type of bed  Dawn [name] will need. Eric at [facility] to call Dr. Read Drivers directly to give him this information.

## 2011-09-24 NOTE — ED Notes (Signed)
Pt reports right chest wall pain that started around 2pm yesterday, "feels like im filling up w. Fluids"

## 2011-09-29 LAB — CULTURE, BLOOD (ROUTINE X 2)

## 2011-10-05 DIAGNOSIS — Z452 Encounter for adjustment and management of vascular access device: Secondary | ICD-10-CM | POA: Diagnosis not present

## 2011-10-05 DIAGNOSIS — Z5181 Encounter for therapeutic drug level monitoring: Secondary | ICD-10-CM | POA: Diagnosis not present

## 2011-10-05 DIAGNOSIS — D571 Sickle-cell disease without crisis: Secondary | ICD-10-CM | POA: Diagnosis not present

## 2011-10-05 DIAGNOSIS — Z79899 Other long term (current) drug therapy: Secondary | ICD-10-CM | POA: Diagnosis not present

## 2011-10-09 DIAGNOSIS — Z79899 Other long term (current) drug therapy: Secondary | ICD-10-CM | POA: Diagnosis not present

## 2011-10-09 DIAGNOSIS — R809 Proteinuria, unspecified: Secondary | ICD-10-CM | POA: Diagnosis not present

## 2011-10-09 DIAGNOSIS — D571 Sickle-cell disease without crisis: Secondary | ICD-10-CM | POA: Diagnosis not present

## 2011-10-09 DIAGNOSIS — M549 Dorsalgia, unspecified: Secondary | ICD-10-CM | POA: Diagnosis not present

## 2011-10-09 DIAGNOSIS — R7881 Bacteremia: Secondary | ICD-10-CM | POA: Diagnosis not present

## 2011-10-09 DIAGNOSIS — Z5181 Encounter for therapeutic drug level monitoring: Secondary | ICD-10-CM | POA: Diagnosis not present

## 2011-10-17 DIAGNOSIS — Z79899 Other long term (current) drug therapy: Secondary | ICD-10-CM | POA: Diagnosis not present

## 2011-10-17 DIAGNOSIS — A319 Mycobacterial infection, unspecified: Secondary | ICD-10-CM | POA: Diagnosis not present

## 2011-10-17 DIAGNOSIS — D571 Sickle-cell disease without crisis: Secondary | ICD-10-CM | POA: Diagnosis not present

## 2011-10-17 DIAGNOSIS — Z5181 Encounter for therapeutic drug level monitoring: Secondary | ICD-10-CM | POA: Diagnosis not present

## 2011-10-17 DIAGNOSIS — R7881 Bacteremia: Secondary | ICD-10-CM | POA: Diagnosis not present

## 2011-10-17 DIAGNOSIS — R809 Proteinuria, unspecified: Secondary | ICD-10-CM | POA: Diagnosis not present

## 2011-10-24 DIAGNOSIS — R509 Fever, unspecified: Secondary | ICD-10-CM | POA: Diagnosis not present

## 2011-10-24 DIAGNOSIS — A310 Pulmonary mycobacterial infection: Secondary | ICD-10-CM | POA: Diagnosis not present

## 2011-11-14 DIAGNOSIS — R52 Pain, unspecified: Secondary | ICD-10-CM | POA: Diagnosis not present

## 2011-11-14 DIAGNOSIS — Z79899 Other long term (current) drug therapy: Secondary | ICD-10-CM | POA: Diagnosis not present

## 2011-11-14 DIAGNOSIS — D571 Sickle-cell disease without crisis: Secondary | ICD-10-CM | POA: Diagnosis not present

## 2011-11-14 DIAGNOSIS — Z5181 Encounter for therapeutic drug level monitoring: Secondary | ICD-10-CM | POA: Diagnosis not present

## 2011-11-14 DIAGNOSIS — A319 Mycobacterial infection, unspecified: Secondary | ICD-10-CM | POA: Diagnosis not present

## 2011-11-14 DIAGNOSIS — F54 Psychological and behavioral factors associated with disorders or diseases classified elsewhere: Secondary | ICD-10-CM | POA: Diagnosis not present

## 2011-11-14 DIAGNOSIS — M549 Dorsalgia, unspecified: Secondary | ICD-10-CM | POA: Diagnosis not present

## 2011-11-14 DIAGNOSIS — Z1159 Encounter for screening for other viral diseases: Secondary | ICD-10-CM | POA: Diagnosis not present

## 2011-11-14 DIAGNOSIS — R809 Proteinuria, unspecified: Secondary | ICD-10-CM | POA: Diagnosis not present

## 2011-11-27 DIAGNOSIS — R809 Proteinuria, unspecified: Secondary | ICD-10-CM | POA: Diagnosis not present

## 2011-11-27 DIAGNOSIS — N183 Chronic kidney disease, stage 3 unspecified: Secondary | ICD-10-CM | POA: Diagnosis not present

## 2011-11-30 DIAGNOSIS — E039 Hypothyroidism, unspecified: Secondary | ICD-10-CM | POA: Diagnosis not present

## 2011-11-30 DIAGNOSIS — R0609 Other forms of dyspnea: Secondary | ICD-10-CM | POA: Diagnosis not present

## 2011-11-30 DIAGNOSIS — D5701 Hb-SS disease with acute chest syndrome: Secondary | ICD-10-CM | POA: Diagnosis not present

## 2011-11-30 DIAGNOSIS — D5720 Sickle-cell/Hb-C disease without crisis: Secondary | ICD-10-CM | POA: Diagnosis not present

## 2011-11-30 DIAGNOSIS — E559 Vitamin D deficiency, unspecified: Secondary | ICD-10-CM | POA: Diagnosis not present

## 2011-11-30 DIAGNOSIS — K113 Abscess of salivary gland: Secondary | ICD-10-CM | POA: Diagnosis not present

## 2011-12-26 ENCOUNTER — Encounter (HOSPITAL_COMMUNITY): Payer: Self-pay | Admitting: *Deleted

## 2011-12-26 ENCOUNTER — Non-Acute Institutional Stay (HOSPITAL_COMMUNITY)
Admission: AD | Admit: 2011-12-26 | Discharge: 2011-12-26 | Disposition: A | Discharge status: Home / Self Care | Payer: Medicare Other | Attending: Internal Medicine | Admitting: Internal Medicine

## 2011-12-26 DIAGNOSIS — J069 Acute upper respiratory infection, unspecified: Secondary | ICD-10-CM | POA: Insufficient documentation

## 2011-12-26 DIAGNOSIS — R0602 Shortness of breath: Secondary | ICD-10-CM | POA: Diagnosis not present

## 2011-12-26 DIAGNOSIS — D5700 Hb-SS disease with crisis, unspecified: Secondary | ICD-10-CM | POA: Insufficient documentation

## 2011-12-26 DIAGNOSIS — Z79899 Other long term (current) drug therapy: Secondary | ICD-10-CM | POA: Insufficient documentation

## 2011-12-26 LAB — DIFFERENTIAL
Basophils Absolute: 0 10*3/uL (ref 0.0–0.1)
Eosinophils Absolute: 0 10*3/uL (ref 0.0–0.7)
Lymphocytes Relative: 17 % (ref 12–46)
Neutro Abs: 9.5 10*3/uL — ABNORMAL HIGH (ref 1.7–7.7)
Neutrophils Relative %: 73 % (ref 43–77)
Smear Review: ADEQUATE

## 2011-12-26 LAB — COMPREHENSIVE METABOLIC PANEL
ALT: 44 U/L — ABNORMAL HIGH (ref 0–35)
Alkaline Phosphatase: 197 U/L — ABNORMAL HIGH (ref 39–117)
CO2: 25 mEq/L (ref 19–32)
GFR calc Af Amer: >90 mL/min (ref >90)
GFR calc non Af Amer: >90 mL/min (ref >90)
Glucose, Bld: 114 mg/dL — ABNORMAL HIGH (ref 70–99)
Potassium: 3.8 mEq/L (ref 3.5–5.1)
Sodium: 134 mEq/L — ABNORMAL LOW (ref 135–145)
Total Bilirubin: 8.4 mg/dL — ABNORMAL HIGH (ref 0.3–1.2)

## 2011-12-26 LAB — CBC
MCHC: 37 g/dL — ABNORMAL HIGH (ref 30.0–36.0)
Platelets: ADEQUATE 10*3/uL (ref 150–400)
RDW: 24.7 % — ABNORMAL HIGH (ref 11.5–15.5)

## 2011-12-26 MED ORDER — ONDANSETRON HCL 4 MG PO TABS: 4.0000 mg | ORAL_TABLET | ORAL | Status: DC | PRN

## 2011-12-26 MED ORDER — FOLIC ACID 1 MG PO TABS: 1.0000 mg | ORAL_TABLET | Freq: Every day | ORAL | Status: DC

## 2011-12-26 MED ORDER — OXYCODONE HCL 5 MG PO CAPS: 10.0000 mg | ORAL_CAPSULE | ORAL | Status: DC | PRN

## 2011-12-26 MED ORDER — HYDROMORPHONE HCL PF 1 MG/ML IJ SOLN
2.0000 mg | INTRAMUSCULAR | Status: DC | PRN
  Administered 2011-12-26 (×2): 2 mg via INTRAVENOUS
  Filled 2011-12-26 (×3): qty 2

## 2011-12-26 MED ORDER — ONDANSETRON HCL 4 MG/2ML IJ SOLN
4.0000 mg | INTRAMUSCULAR | Status: DC | PRN
  Administered 2011-12-26: 4 mg via INTRAVENOUS
  Filled 2011-12-26: qty 2

## 2011-12-26 MED ORDER — DIPHENHYDRAMINE HCL 25 MG PO CAPS: 25.0000 mg | ORAL_CAPSULE | ORAL | Status: DC | PRN

## 2011-12-26 MED ORDER — HYDROMORPHONE HCL PF 2 MG/ML IJ SOLN
2.0000 mg | INTRAMUSCULAR | Status: DC | PRN
  Administered 2011-12-26: 2 mg via INTRAVENOUS
  Filled 2011-12-26: qty 1

## 2011-12-26 MED ORDER — DEXTROSE-NACL 5-0.45 % IV SOLN
INTRAVENOUS | Status: DC
  Administered 2011-12-26: 15:00:00 via INTRAVENOUS

## 2011-12-26 NOTE — H&P (Signed)
Patient ID: Dawn [name], female   DOB: [birth year], 31 y.o.   MRN: [id number]  Chief Complaint  Patient presents with  . Sickle Cell Pain Crisis    HPI Dawn [name] is a 31 y.o. female.  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 She was doing well until yesterday when she noted a mild sore throat with nonproductive cough. She gradually had increasing arthralgias in the arms and legs. This morning she noted her pain was a 6/10. She subsequently came to the sickle cell unit for further therapy. She been taking her medications as scheduled. She was unaware of a fever or night sweats.   Past Medical History  Diagnosis Date  . Sickle cell disease   . Sickle cell disease, type S   . Blood transfusion     Past Surgical History  Procedure Date  . Cholecystectomy   .  left knee acl reconstruction   . Cesarean section     x 2  . Portacath placement     No family history on file.  Social History History  Substance Use Topics  . Smoking status: Never Smoker   . Smokeless tobacco: Not on file  . Alcohol Use: No    Allergies  Allergen Reactions  . Latex Other (See Comments)    REACTION: Pt experiences a burning sensation on contacted skin areas  . Lisinopril Other (See Comments) and Cough    REACTION: Sore/scratchy throat  . Tape Other (See Comments)    REACTION: Pt. Experiences a burning sensation on contacted skin areas  . Tylenol (Acetaminophen) Other (See Comments)    Pt. Does not take the following due to protection of kidney health. Past occurrence of Protein in urine.    Current Facility-Administered Medications  Medication Dose Route Frequency Provider Last Rate Last Dose  . dextrose 5 %-0.45 % sodium chloride infusion   Intravenous Continuous Gwenyth Bender, MD 125 mL/hr at 12/26/11 1527    . diphenhydrAMINE (BENADRYL) capsule 25-50 mg  25-50 mg Oral Q4H PRN Gwenyth Bender, MD      . folic acid (FOLVITE) tablet 1 mg  1 mg Oral Daily Gwenyth Bender, MD      . HYDROmorphone (DILAUDID) injection 2 mg  2 mg Intravenous Q2H PRN Gwenyth Bender, MD   2 mg at 12/26/11 1711  . HYDROmorphone (DILAUDID) injection 2 mg  2 mg Intravenous Q2H PRN Gwenyth Bender, MD      . ondansetron [facility]) tablet 4 mg  4 mg Oral Q4H PRN Gwenyth Bender, MD       Or  . ondansetron [facility]) injection 4 mg  4 mg Intravenous Q4H PRN Gwenyth Bender, MD   4 mg at 12/26/11 1538    Review of Systems She denies any wheezing. His been no abdominal pain or diarrhea. She notes that her child had a mild upper respiratory infection as well. She complains of sore throat is noted.  Blood pressure 130/76, pulse 116, temperature 98.4 F (36.9 C), temperature source Oral, resp. rate 20, SpO2 92.00%.  Physical Exam Well-developed well-nourished black female in mild distress. HEENT: Sclera icterus. No sinus tenderness. Throat with minimal posterior pharyngeal erythema. NECK: No enlarged thyroid. Small posterior cervical node on the left versus right nontender. LUNGS: Clear to auscultation. No CVA tenderness. No vocal fremitus. CV: Normal S1, S2 without S3. No murmurs or rubs. ABDOMEN: Nontender. EXTREMITIES: Tender left a.c. joint and left deltoid  tendon. Minimal tenderness in the knees. Negative Homans. NEURO: Nonfocal.  Data Reviewed CBC: WC of 13,000, hemoglobin 8.5, hematocrit of 23.0. Total bilirubin of 8.4  Assessment    Sickle cell crisis. Early upper respiratory infection.     Plan    IV fluids. IV Dilaudid for pain control. Reassessment.        Dawn [name] 12/26/2011, 6:02 PM

## 2011-12-27 ENCOUNTER — Non-Acute Institutional Stay (HOSPITAL_COMMUNITY)
Admission: AD | Admit: 2011-12-27 | Discharge: 2011-12-27 | Disposition: A | Discharge status: Home / Self Care | Payer: Medicare Other | Attending: Internal Medicine | Admitting: Internal Medicine

## 2011-12-27 DIAGNOSIS — I10 Essential (primary) hypertension: Secondary | ICD-10-CM | POA: Insufficient documentation

## 2011-12-27 DIAGNOSIS — R509 Fever, unspecified: Secondary | ICD-10-CM | POA: Insufficient documentation

## 2011-12-27 DIAGNOSIS — D72829 Elevated white blood cell count, unspecified: Secondary | ICD-10-CM | POA: Insufficient documentation

## 2011-12-27 DIAGNOSIS — D5700 Hb-SS disease with crisis, unspecified: Secondary | ICD-10-CM | POA: Diagnosis not present

## 2011-12-27 DIAGNOSIS — R7989 Other specified abnormal findings of blood chemistry: Secondary | ICD-10-CM | POA: Insufficient documentation

## 2011-12-27 DIAGNOSIS — R0602 Shortness of breath: Secondary | ICD-10-CM | POA: Diagnosis not present

## 2011-12-27 MED ORDER — HYDROMORPHONE HCL PF 2 MG/ML IJ SOLN
INTRAMUSCULAR | Status: AC
  Administered 2011-12-27: 2 mg
  Filled 2011-12-27: qty 1

## 2011-12-27 MED ORDER — HYDROMORPHONE HCL PF 1 MG/ML IJ SOLN
2.0000 mg | INTRAMUSCULAR | Status: DC | PRN
  Administered 2011-12-27: 4 mg via INTRAVENOUS
  Administered 2011-12-27: 2 mg via INTRAVENOUS
  Filled 2011-12-27: qty 4
  Filled 2011-12-27: qty 2

## 2011-12-27 MED ORDER — DIPHENHYDRAMINE HCL 25 MG PO CAPS: 25.0000 mg | ORAL_CAPSULE | ORAL | Status: DC | PRN

## 2011-12-27 MED ORDER — ONDANSETRON HCL 4 MG/2ML IJ SOLN
4.0000 mg | INTRAMUSCULAR | Status: DC | PRN
  Administered 2011-12-27 (×2): 4 mg via INTRAVENOUS
  Filled 2011-12-27 (×2): qty 2

## 2011-12-27 MED ORDER — FOLIC ACID 1 MG PO TABS: 1.0000 mg | ORAL_TABLET | Freq: Every day | ORAL | Status: DC

## 2011-12-27 MED ORDER — ONDANSETRON HCL 4 MG PO TABS: 4.0000 mg | ORAL_TABLET | ORAL | Status: DC | PRN

## 2011-12-27 MED ORDER — DIPHENHYDRAMINE HCL 50 MG/ML IJ SOLN: 12.5000 mg | INTRAMUSCULAR | Status: DC | PRN

## 2011-12-27 MED ORDER — DEXTROSE-NACL 5-0.45 % IV SOLN
INTRAVENOUS | Status: DC
  Administered 2011-12-27: 11:00:00 via INTRAVENOUS

## 2011-12-27 MED ORDER — HYDROMORPHONE HCL PF 1 MG/ML IJ SOLN
2.0000 mg | INTRAMUSCULAR | Status: DC | PRN
  Administered 2011-12-27: 2 mg via INTRAVENOUS
  Filled 2011-12-27 (×2): qty 1

## 2011-12-27 MED ORDER — HYDROMORPHONE HCL PF 4 MG/ML IJ SOLN
INTRAMUSCULAR | Status: AC
  Administered 2011-12-27: 4 mg
  Filled 2011-12-27: qty 1

## 2011-12-27 NOTE — Progress Notes (Signed)
[facility] Treatment Note:  Dawn [name] is a 31 year old with hemoglobin SS disease who was treated yesterday in the sickle cell medicine Center. She did well most of the afternoon after going home. She notes her pain had gotten as low as a 3/10. Over the night her pain gradually returned. This morning she was a 7/10. Her pain was diffuse in nature. There was no associated fever chills or night sweats. She been taking her medication without significant relief. He came today for further treatment. No new complaints otherwise.   Allergies  Allergen Reactions  . Latex Other (See Comments)    REACTION: Pt experiences a burning sensation on contacted skin areas  . Lisinopril Other (See Comments) and Cough    REACTION: Sore/scratchy throat  . Tape Other (See Comments)    REACTION: Pt. Experiences a burning sensation on contacted skin areas  . Tylenol (Acetaminophen) Other (See Comments)    Pt. Does not take the following due to protection of kidney health. Past occurrence of Protein in urine.   Current Facility-Administered Medications  Medication Dose Route Frequency Provider Last Rate Last Dose  . dextrose 5 %-0.45 % sodium chloride infusion   Intravenous Continuous Gwenyth Bender, MD 125 mL/hr at 12/27/11 1120    . diphenhydrAMINE (BENADRYL) capsule 25-50 mg  25-50 mg Oral Q4H PRN Gwenyth Bender, MD      . diphenhydrAMINE (BENADRYL) injection 12.5-25 mg  12.5-25 mg Intravenous Q4H PRN Gwenyth Bender, MD      . folic acid (FOLVITE) tablet 1 mg  1 mg Oral Daily Gwenyth Bender, MD      . HYDROmorphone (DILAUDID) 2 MG/ML injection        2 mg at 12/27/11 1528  . HYDROmorphone (DILAUDID) injection 2-4 mg  2-4 mg Intravenous Q2H PRN Gwenyth Bender, MD   2 mg at 12/27/11 1421  . ondansetron (ZOFRAN) tablet 4 mg  4 mg Oral Q4H PRN Gwenyth Bender, MD       Or  . ondansetron [facility]) injection 4 mg  4 mg Intravenous Q4H PRN Gwenyth Bender, MD   4 mg at 12/27/11 1126  . DISCONTD: HYDROmorphone (DILAUDID)  injection 2 mg  2 mg Intravenous Q2H PRN Gwenyth Bender, MD   2 mg at 12/27/11 1120   Facility-Administered Medications Ordered in Other Encounters  Medication Dose Route Frequency Provider Last Rate Last Dose  . DISCONTD: dextrose 5 %-0.45 % sodium chloride infusion   Intravenous Continuous Gwenyth Bender, MD 125 mL/hr at 12/26/11 1527    . DISCONTD: diphenhydrAMINE (BENADRYL) capsule 25-50 mg  25-50 mg Oral Q4H PRN Gwenyth Bender, MD      . DISCONTD: folic acid (FOLVITE) tablet 1 mg  1 mg Oral Daily Gwenyth Bender, MD      . DISCONTD: HYDROmorphone (DILAUDID) injection 2 mg  2 mg Intravenous Q2H PRN Gwenyth Bender, MD   2 mg at 12/26/11 1711  . DISCONTD: HYDROmorphone (DILAUDID) injection 2 mg  2 mg Intravenous Q2H PRN Gwenyth Bender, MD   2 mg at 12/26/11 1828  . DISCONTD: ondansetron (ZOFRAN) injection 4 mg  4 mg Intravenous Q4H PRN Gwenyth Bender, MD   4 mg at 12/26/11 1538  . DISCONTD: ondansetron (ZOFRAN) tablet 4 mg  4 mg Oral Q4H PRN Gwenyth Bender, MD        Objective: Blood pressure 123/62, pulse 102, temperature 98.7 F (37.1 C), temperature source Oral, resp.  rate 18, height 5\' 3"  (1.6 m), weight 140 lb (63.504 kg), SpO2 95.00%.  Well-developed well-nourished black female in mild distress. HEENT: No sinus tenderness. No sclera icterus. NECK: No enlarged thyroid. No posterior cervical nodes. LUNGS: Clear to auscultation. No vocal fremitus. No CVA tenderness. CV: Normal S1, S2 without S3. ABD: Nontender presently. MSK: Mild tenderness in the lower lumbar sacral spine. Negative Homans. NEURO: Intact.  Lab results: Results for orders placed during the hospital encounter of 12/26/11 (from the past 48 hour(s))  COMPREHENSIVE METABOLIC PANEL     Status: Abnormal   Collection Time   12/26/11  3:05 PM      Component Value Range Comment   Sodium 134 (*) 135 - 145 (mEq/L)    Potassium 3.8  3.5 - 5.1 (mEq/L)    Chloride 99  96 - 112 (mEq/L)    CO2 25  19 - 32 (mEq/L)    Glucose, Bld 114 (*) 70 - 99  (mg/dL)    BUN 7  6 - 23 (mg/dL)    Creatinine, Ser 0.27  0.50 - 1.10 (mg/dL)    Calcium 9.2  8.4 - 10.5 (mg/dL)    Total Protein 8.3  6.0 - 8.3 (g/dL)    Albumin 3.7  3.5 - 5.2 (g/dL)    AST 253 (*) 0 - 37 (U/L) ICTERIC SPECIMEN   ALT 44 (*) 0 - 35 (U/L)    Alkaline Phosphatase 197 (*) 39 - 117 (U/L)    Total Bilirubin 8.4 (*) 0.3 - 1.2 (mg/dL)    GFR calc non Af Amer >90  >90 (mL/min)    GFR calc Af Amer >90  >90 (mL/min)   CBC     Status: Abnormal   Collection Time   12/26/11  3:05 PM      Component Value Range Comment   WBC 13.0 (*) 4.0 - 10.5 (K/uL) WHITE COUNT CONFIRMED ON SMEAR   RBC 2.25 (*) 3.87 - 5.11 (MIL/uL)    Hemoglobin 8.5 (*) 12.0 - 15.0 (g/dL)    HCT 66.4 (*) 40.3 - 46.0 (%)    MCV 102.2 (*) 78.0 - 100.0 (fL)    MCH 37.8 (*) 26.0 - 34.0 (pg)    MCHC 37.0 (*) 30.0 - 36.0 (g/dL) CORRECTED FOR COLD AGGLUTININS   RDW 24.7 (*) 11.5 - 15.5 (%)    Platelets    150 - 400 (K/uL)    Value: PLATELET CLUMPS NOTED ON SMEAR, COUNT APPEARS ADEQUATE  DIFFERENTIAL     Status: Abnormal   Collection Time   12/26/11  3:05 PM      Component Value Range Comment   Neutrophils Relative 73  43 - 77 (%)    Lymphocytes Relative 17  12 - 46 (%)    Monocytes Relative 10  3 - 12 (%)    Eosinophils Relative 0  0 - 5 (%)    Basophils Relative 0  0 - 1 (%)    Neutro Abs 9.5 (*) 1.7 - 7.7 (K/uL)    Lymphs Abs 2.2  0.7 - 4.0 (K/uL)    Monocytes Absolute 1.3 (*) 0.1 - 1.0 (K/uL)    Eosinophils Absolute 0.0  0.0 - 0.7 (K/uL)    Basophils Absolute 0.0  0.0 - 0.1 (K/uL)    RBC Morphology POLYCHROMASIA PRESENT      Smear Review PLATELETS APPEAR ADEQUATE       Studies/Results: No results found.  Patient Active Problem List  Diagnoses  . Sickle cell crisis  . Sickle  cell disease  . Elevated LFTs  . Sickle cell anemia  . Fever  . Leukocytosis  . HTN (hypertension)    Impression: Sickle cell crisis persisting. Status post upper respiratory infection. Improved.   Plan: Continue IV  fluids. IV Dilaudid. Reevaluate progress. Anticipate discharge home.   August Saucer, [place] Leicht 12/27/2011 4:12 PM

## 2011-12-27 NOTE — Discharge Instructions (Signed)
Sickle Cell Pain Crisis Sickle cell anemia requires regular medical attention by your healthcare provider and awareness about when to seek medical care. Pain is a common problem in children with sickle cell disease. This usually starts at less than 31 year of age. Pain can occur nearly anywhere in the body but most commonly occurs in the extremities, back, chest, or belly (abdomen). Pain episodes can start suddenly or may follow an illness. These attacks can appear as decreased activity, loss of appetite, change in behavior, or simply complaints of pain. DIAGNOSIS   Specialized blood and gene testing can help make this diagnosis early in the disease. Blood tests may then be done to watch blood levels.   Specialized brain scans are done when there are problems in the brain during a crisis.   Lung testing may be done later in the disease.  HOME CARE INSTRUCTIONS   Maintain good hydration. Increase you or your child's fluid intake in hot weather and during exercise.   Avoid smoking. Smoking lowers the oxygen in the blood and can cause the production of sickle-shaped cells (sickling).   Control pain. Only take over-the-counter or prescription medicines for pain, discomfort, or fever as directed by your caregiver. Do not give aspirin to children because of the association with Reye's syndrome.   Keep regular health care checks to keep a proper red blood cell (hemoglobin) level. A moderate anemia level protects against sickling crises.   You and your child should receive all the same immunizations and care as the people around you.   Mothers should breastfeed their babies if possible. Use formulas with iron added if breastfeeding is not possible. Additional iron should not be given unless there is a lack of it. People with sickle cell disease (SCD) build up iron faster than normal. Give folic acid and additional vitamins as directed.   If you or your child has been prescribed antibiotics or other  medications to prevent problems, take them as directed.   Summer camps are available for children with SCD. They may help young people deal with their disease. The camps introduce them to other children with the same problem.   Young people with SCD may become frustrated or angry at their disease. This can cause rebellion and refusal to follow medical care. Help groups or counseling may help with these problems.   Wear a medical alert bracelet. When traveling, keep medical information, caregiver's names, and the medications you or your child takes with you at all times.  SEEK IMMEDIATE MEDICAL CARE IF:   You or your child develops dizziness or fainting, numbness in or difficulty with movement of arms and legs, difficulty with speech, or is acting abnormally. This could be early signs of a stroke. Immediate treatment is necessary.   You or your child has an oral temperature above 102 F (38.9 C), not controlled by medicine.   You or your child has other signs of infection (chills, lethargy, irritability, poor eating, vomiting). The younger the child, the more you should be concerned.   With fevers, do not give medicine to lower the fever right away. This could cover up a problem that is developing. Notify your caregiver.   You or your child develops pain that is not helped with medicine.   You or your child develops shortness of breath or is coughing up pus-like or bloody sputum.   You or your child develops any problems that are new and are causing you to worry.   You or   your child develops a persistent, often uncomfortable and painful penile erection. This is called priapism. Always check young boys for this. It is often embarrassing for them and they may not bring it to your attention. This is a medical emergency and needs immediate treatment. If this is not treated it will lead to impotence.   You or your child develops a new onset of abdominal pain, especially on the left side near the  stomach area.   You or your child has any questions or has problems that are not getting better. Return immediately if you feel your child is getting worse, even if your child was seen only a short while ago.  Document Released: 06/20/2005 Document Revised: 08/30/2011 Document Reviewed: 11/09/2009 [employer] Patient Information 2012 [employer]. 

## 2011-12-28 ENCOUNTER — Encounter (HOSPITAL_COMMUNITY): Payer: Self-pay | Admitting: *Deleted

## 2011-12-28 ENCOUNTER — Inpatient Hospital Stay (HOSPITAL_COMMUNITY)
Admission: AD | Admit: 2011-12-28 | Discharge: 2012-01-09 | DRG: 812 | Disposition: A | Discharge status: Home / Self Care | Payer: Medicare Other | Attending: Internal Medicine | Admitting: Internal Medicine

## 2011-12-28 DIAGNOSIS — J069 Acute upper respiratory infection, unspecified: Secondary | ICD-10-CM | POA: Diagnosis present

## 2011-12-28 DIAGNOSIS — E876 Hypokalemia: Secondary | ICD-10-CM | POA: Diagnosis not present

## 2011-12-28 DIAGNOSIS — D5700 Hb-SS disease with crisis, unspecified: Secondary | ICD-10-CM

## 2011-12-28 DIAGNOSIS — K5900 Constipation, unspecified: Secondary | ICD-10-CM | POA: Diagnosis present

## 2011-12-28 DIAGNOSIS — J329 Chronic sinusitis, unspecified: Secondary | ICD-10-CM | POA: Diagnosis present

## 2011-12-28 DIAGNOSIS — I10 Essential (primary) hypertension: Secondary | ICD-10-CM | POA: Diagnosis not present

## 2011-12-28 DIAGNOSIS — D72829 Elevated white blood cell count, unspecified: Secondary | ICD-10-CM | POA: Diagnosis present

## 2011-12-28 LAB — COMPREHENSIVE METABOLIC PANEL
ALT: 42 U/L — ABNORMAL HIGH (ref 0–35)
AST: 102 U/L — ABNORMAL HIGH (ref 0–37)
Albumin: 3.4 g/dL — ABNORMAL LOW (ref 3.5–5.2)
Alkaline Phosphatase: 187 U/L — ABNORMAL HIGH (ref 39–117)
BUN: 5 mg/dL — ABNORMAL LOW (ref 6–23)
CO2: 27 mEq/L (ref 19–32)
Calcium: 8.9 mg/dL (ref 8.4–10.5)
Chloride: 101 mEq/L (ref 96–112)
Creatinine, Ser: 0.6 mg/dL (ref 0.50–1.10)
GFR calc Af Amer: >90 mL/min (ref >90)
GFR calc non Af Amer: >90 mL/min (ref >90)
Glucose, Bld: 113 mg/dL — ABNORMAL HIGH (ref 70–99)
Potassium: 3.6 mEq/L (ref 3.5–5.1)
Sodium: 136 mEq/L (ref 135–145)
Total Bilirubin: 7.7 mg/dL — ABNORMAL HIGH (ref 0.3–1.2)
Total Protein: 7.5 g/dL (ref 6.0–8.3)

## 2011-12-28 LAB — CBC
HCT: 20.5 % — ABNORMAL LOW (ref 36.0–46.0)
Hemoglobin: 7.7 g/dL — ABNORMAL LOW (ref 12.0–15.0)
MCV: 99.5 fL (ref 78.0–100.0)
RBC: 2.06 MIL/uL — ABNORMAL LOW (ref 3.87–5.11)
WBC: 8.9 10*3/uL (ref 4.0–10.5)

## 2011-12-28 MED ORDER — ONDANSETRON HCL 4 MG/2ML IJ SOLN
4.0000 mg | INTRAMUSCULAR | Status: DC | PRN
  Administered 2011-12-28 – 2012-01-06 (×15): 4 mg via INTRAVENOUS
  Filled 2011-12-28 (×15): qty 2

## 2011-12-28 MED ORDER — DEXTROSE-NACL 5-0.45 % IV SOLN
INTRAVENOUS | Status: DC
  Administered 2011-12-28 – 2012-01-03 (×17): via INTRAVENOUS
  Administered 2012-01-03: 125 mL via INTRAVENOUS
  Administered 2012-01-04 – 2012-01-09 (×15): via INTRAVENOUS
  Filled 2011-12-28: qty 1000

## 2011-12-28 MED ORDER — ONDANSETRON HCL 4 MG PO TABS
4.0000 mg | ORAL_TABLET | ORAL | Status: DC | PRN
  Administered 2011-12-31: 4 mg via ORAL
  Filled 2011-12-28: qty 1

## 2011-12-28 MED ORDER — HYDROMORPHONE HCL PF 4 MG/ML IJ SOLN
INTRAMUSCULAR | Status: AC
  Administered 2011-12-28: 4 mg
  Filled 2011-12-28: qty 1

## 2011-12-28 MED ORDER — HYDROMORPHONE HCL PF 1 MG/ML IJ SOLN
2.0000 mg | INTRAMUSCULAR | Status: DC | PRN
  Administered 2011-12-28 – 2011-12-29 (×7): 4 mg via INTRAVENOUS
  Filled 2011-12-28 (×7): qty 4

## 2011-12-28 MED ORDER — DIPHENHYDRAMINE HCL 25 MG PO CAPS: 25.0000 mg | ORAL_CAPSULE | ORAL | Status: DC | PRN

## 2011-12-28 NOTE — Progress Notes (Signed)
Patient ID: Dawn [name], female   DOB: 06/11/1981, 31 y.o.   MRN: [id number] Report called to [facility] [address]. Pt transported via wheelchair to 1342 with admission services staff and mother. Pt belongings returned to pt upon d/c.

## 2011-12-28 NOTE — H&P (Signed)
Patient ID: Dawn [name], female   DOB: [birth year], 31 y.o.   MRN: [id number]  Chief Complaint  Patient presents with  . Sickle Cell Pain Crisis    HPI Dawn [name] is a 31 y.o. female.  With history of hemoglobin SS disease who returns to the unit for further treatment of her sickle cell pain. She notes that after being at home last night she had an exacerbation of her pain with diffuse arthralgias. She did spike a temperature as well too low on a 1.3. She's had intermittent sinus congestion with transient bloody rhinorrhea. Her pain increased to 8/10. She repositioned this morning for further treatment. She denies increased shortness of breath. There's been no chest pains or abdominal pain. She has some pains in her lower extremities as well.  Patient reports that she she did start the antibiotics as recommended. No other new complaints.    Past Medical History  Diagnosis Date  . Sickle cell disease   . Sickle cell disease, type S   . Blood transfusion     Past Surgical History  Procedure Date  . Cholecystectomy   .  left knee acl reconstruction   . Cesarean section     x 2  . Portacath placement     No family history on file.  Social History History  Substance Use Topics  . Smoking status: Never Smoker   . Smokeless tobacco: Not on file  . Alcohol Use: No    Allergies  Allergen Reactions  . Latex Other (See Comments)    REACTION: Pt experiences a burning sensation on contacted skin areas  . Lisinopril Other (See Comments) and Cough    REACTION: Sore/scratchy throat  . Tape Other (See Comments)    REACTION: Pt. Experiences a burning sensation on contacted skin areas  . Tylenol (Acetaminophen) Other (See Comments)    Pt. Does not take the following due to protection of kidney health. Past occurrence of Protein in urine.    Current Facility-Administered Medications  Medication Dose Route Frequency Provider Last Rate Last Dose  . dextrose 5 %-0.45 % sodium chloride  infusion   Intravenous Continuous Gwenyth Bender, MD 125 mL/hr at 12/28/11 1348    . diphenhydrAMINE (BENADRYL) capsule 25-50 mg  25-50 mg Oral Q4H PRN Gwenyth Bender, MD      . HYDROmorphone (DILAUDID) injection 2-4 mg  2-4 mg Intravenous Q2H PRN Gwenyth Bender, MD   4 mg at 12/28/11 1348  . ondansetron (ZOFRAN) tablet 4 mg  4 mg Oral Q4H PRN Gwenyth Bender, MD       Or  . ondansetron [facility]) injection 4 mg  4 mg Intravenous Q4H PRN Gwenyth Bender, MD   4 mg at 12/28/11 1350   Facility-Administered Medications Ordered in Other Encounters  Medication Dose Route Frequency Provider Last Rate Last Dose  . HYDROmorphone (DILAUDID) 2 MG/ML injection        2 mg at 12/27/11 1528  . HYDROmorphone (DILAUDID) 4 MG/ML injection        4 mg at 12/27/11 1711  . DISCONTD: dextrose 5 %-0.45 % sodium chloride infusion   Intravenous Continuous Gwenyth Bender, MD 125 mL/hr at 12/27/11 1120    . DISCONTD: diphenhydrAMINE (BENADRYL) capsule 25-50 mg  25-50 mg Oral Q4H PRN Gwenyth Bender, MD      . DISCONTD: diphenhydrAMINE (BENADRYL) injection 12.5-25 mg  12.5-25 mg Intravenous Q4H PRN Gwenyth Bender, MD      .  DISCONTD: folic acid (FOLVITE) tablet 1 mg  1 mg Oral Daily Gwenyth Bender, MD      . DISCONTD: HYDROmorphone (DILAUDID) injection 2-4 mg  2-4 mg Intravenous Q2H PRN Gwenyth Bender, MD   2 mg at 12/27/11 1421  . DISCONTD: ondansetron (ZOFRAN) injection 4 mg  4 mg Intravenous Q4H PRN Gwenyth Bender, MD   4 mg at 12/27/11 1721  . DISCONTD: ondansetron (ZOFRAN) tablet 4 mg  4 mg Oral Q4H PRN Gwenyth Bender, MD        Review of Systems: as noted above.  Blood pressure 129/70, pulse 115, temperature 99.6 F (37.6 C), temperature source Oral, SpO2 86.00%.  Physical Exam Well-developed ill-appearing black female in mild distress. HEENT: Left frontal and left maxillary sinus tenderness. Positive sclera icterus. TMs are decreased light reflex. Throat mild posterior pharyngeal erythema. NECK: No posterior cervical nodes. No enlarged  thyroid. LUNGS: Clear to auscultation. No vocal fremitus. CV: Normal S1, S2 without S3. 1/6 systolic ejection murmur loudest in the lower left sternal border. ABDOMEN: Bowel sounds are present. No masses or tenderness. MSK: Tenderness in the lower lumbar sacral spine. Tenderness in the anterior thighs bilaterally. Negative Homans. No crepitation appreciated in the knees. NEURO: Alert, oriented x3. Cranial nerves were intact. Nonfocal.  Data Reviewed Pending.  Assessment    Persistent sickle cell crisis. Occult sinus disease. Pulmonary exam unremarkable at the present. Refractory pain.    Plan    Continue IV fluids. IV Zithromax. IV Dilaudid bolus versus PCA. Will need further treatment as an inpatient. Repeat CBC, CMET.       Cadin Luka 12/28/2011, 2:27 PM

## 2011-12-29 LAB — RETICULOCYTES
RBC.: 1.86 MIL/uL — ABNORMAL LOW (ref 3.87–5.11)
Retic Ct Pct: 40.9 % — ABNORMAL HIGH (ref 0.4–3.1)

## 2011-12-29 MED ORDER — DEXTROSE 5 % IV SOLN
500.0000 mg | INTRAVENOUS | Status: DC
  Administered 2011-12-29 – 2012-01-01 (×4): 500 mg via INTRAVENOUS
  Filled 2011-12-29 (×5): qty 500

## 2011-12-29 MED ORDER — HYDROMORPHONE HCL PF 2 MG/ML IJ SOLN: 4.0000 mg | INTRAMUSCULAR | Status: DC | PRN

## 2011-12-29 MED ORDER — PANTOPRAZOLE SODIUM 40 MG PO TBEC
40.0000 mg | DELAYED_RELEASE_TABLET | Freq: Every day | ORAL | Status: DC
  Administered 2011-12-29 – 2012-01-09 (×12): 40 mg via ORAL
  Filled 2011-12-29 (×12): qty 1

## 2011-12-29 MED ORDER — HYDROMORPHONE HCL PF 1 MG/ML IJ SOLN
4.0000 mg | INTRAMUSCULAR | Status: DC | PRN
  Administered 2011-12-29 (×3): 4 mg via INTRAVENOUS
  Filled 2011-12-29 (×4): qty 4

## 2011-12-29 MED ORDER — TRAMADOL HCL 50 MG PO TABS: 50.0000 mg | ORAL_TABLET | Freq: Four times a day (QID) | ORAL | Status: DC | PRN

## 2011-12-29 MED ORDER — HYDROXYUREA 500 MG PO CAPS
1000.0000 mg | ORAL_CAPSULE | ORAL | Status: DC
  Administered 2011-12-29 – 2012-01-08 (×4): 1000 mg via ORAL
  Filled 2011-12-29 (×5): qty 2

## 2011-12-29 MED ORDER — HYDROXYUREA 500 MG PO CAPS
500.0000 mg | ORAL_CAPSULE | ORAL | Status: DC
  Administered 2011-12-30 – 2012-01-09 (×8): 500 mg via ORAL
  Filled 2011-12-29 (×7): qty 1

## 2011-12-29 MED ORDER — HYDROMORPHONE HCL PF 4 MG/ML IJ SOLN
4.0000 mg | INTRAMUSCULAR | Status: DC | PRN
  Administered 2011-12-29 – 2012-01-09 (×93): 4 mg via INTRAVENOUS
  Filled 2011-12-29 (×96): qty 1

## 2011-12-29 NOTE — Progress Notes (Signed)
Subjective:  Patient complains of back and leg pain grade 7/10. Denies any urinary complaints. Denies any chest pain or shortness of breath  Objective:  Vital Signs in the last 24 hours: Temp:  [98.3 F (36.8 C)-99.6 F (37.6 C)] 98.3 F (36.8 C) (04/06 0516) Pulse Rate:  [91-115] 91  (04/06 0516) Resp:  [18] 18  (04/06 0516) BP: (106-129)/(68-76) 106/69 mmHg (04/06 0516) SpO2:  [83 %-96 %] 94 % (04/06 0516) Weight:  [63.504 kg (140 lb)] 63.504 kg (140 lb) (04/05 1926)  Intake/Output from previous day: 04/05 0701 - 04/06 0700 In: 2000 [I.V.:2000] Out: 650 [Urine:650] Intake/Output from this shift:    Physical Exam: Neck: no adenopathy, no carotid bruit, no JVD and supple, symmetrical, trachea midline Lungs: clear to auscultation bilaterally Heart: regular rate and rhythm, S1, S2 normal, no murmur, click, rub or gallop Abdomen: soft, non-tender; bowel sounds normal; no masses,  no organomegaly Extremities: extremities normal, atraumatic, no cyanosis or edema  Lab Results:  Basename 12/28/11 1520 12/26/11 1505  WBC 8.9 13.0*  HGB 7.7* 8.5*  PLT 190 PLATELET CLUMPS NOTED ON SMEAR, COUNT APPEARS ADEQUATE    Basename 12/28/11 1520 12/26/11 1505  NA 136 134*  K 3.6 3.8  CL 101 99  CO2 27 25  GLUCOSE 113* 114*  BUN 5* 7  CREATININE 0.60 0.60   No results found for this basename: TROPONINI:2,CK,MB:2 in the last 72 hours Hepatic Function Panel  Basename 12/28/11 1520  PROT 7.5  ALBUMIN 3.4*  AST 102*  ALT 42*  ALKPHOS 187*  BILITOT 7.7*  BILIDIR --  IBILI --   No results found for this basename: CHOL in the last 72 hours No results found for this basename: PROTIME in the last 72 hours  Imaging: Imaging results have been reviewed and No results found.  Cardiac Studies:  Assessment/Plan:  Resolving sickle cell crisis Resolving sinusitis Anemia Plan Continue present management  LOS: 1 day    Dawn [name] N 12/29/2011, 10:21 AM

## 2011-12-29 NOTE — Progress Notes (Signed)
Pt. Continues to rate lower back and bilateral leg pain 8/10 despite every two hour prn medication regimen.  Dr. Sharyn Lull notified and new orders received. See MAR.

## 2011-12-30 MED ORDER — SENNOSIDES-DOCUSATE SODIUM 8.6-50 MG PO TABS
1.0000 | ORAL_TABLET | Freq: Two times a day (BID) | ORAL | Status: DC | PRN
  Administered 2011-12-30 – 2012-01-07 (×3): 1 via ORAL
  Filled 2011-12-30 (×3): qty 1

## 2011-12-30 NOTE — Progress Notes (Signed)
Subjective:  Patient complains of back and leg pain grade 5/10 denies any chest pain shortness of breath cough fever or chills Denies urinary complaints states feels a little better today complains of constipation Objective:  Vital Signs in the last 24 hours: Temp:  [98.4 F (36.9 C)-98.7 F (37.1 C)] 98.5 F (36.9 C) (04/07 1015) Pulse Rate:  [85-103] 85  (04/07 1015) Resp:  [18-19] 18  (04/07 1015) BP: (100-129)/(65-79) 112/73 mmHg (04/07 1015) SpO2:  [80 %-99 %] 99 % (04/07 1015)  Intake/Output from previous day: 04/06 0701 - 04/07 0700 In: 4176.8 [P.O.:1220; I.V.:2956.8] Out: -  Intake/Output from this shift:    Physical Exam: Neck: no adenopathy, no carotid bruit, no JVD and supple, symmetrical, trachea midline Lungs: clear to auscultation bilaterally Heart: regular rate and rhythm, S1, S2 normal, no murmur, click, rub or gallop Abdomen: soft, non-tender; bowel sounds normal; no masses,  no organomegaly Extremities: extremities normal, atraumatic, no cyanosis or edema  Lab Results:  Basename 12/28/11 1520  WBC 8.9  HGB 7.7*  PLT 190    Basename 12/28/11 1520  NA 136  K 3.6  CL 101  CO2 27  GLUCOSE 113*  BUN 5*  CREATININE 0.60   No results found for this basename: TROPONINI:2,CK,MB:2 in the last 72 hours Hepatic Function Panel  Basename 12/28/11 1520  PROT 7.5  ALBUMIN 3.4*  AST 102*  ALT 42*  ALKPHOS 187*  BILITOT 7.7*  BILIDIR --  IBILI --   No results found for this basename: CHOL in the last 72 hours No results found for this basename: PROTIME in the last 72 hours  Imaging: Imaging results have been reviewed and No results found.  Cardiac Studies:  Assessment/Plan:  Resolving sickle cell crisis  Resolving sinusitis  Anemia Constipation Plan Continue present management Rx for constipation Check labs in a.m.  LOS: 2 days    Melodye Swor N 12/30/2011, 1:17 PM

## 2011-12-31 LAB — BASIC METABOLIC PANEL
CO2: 29 mEq/L (ref 19–32)
Calcium: 8.5 mg/dL (ref 8.4–10.5)
Creatinine, Ser: 0.67 mg/dL (ref 0.50–1.10)
GFR calc non Af Amer: >90 mL/min (ref >90)
Glucose, Bld: 111 mg/dL — ABNORMAL HIGH (ref 70–99)
Sodium: 138 mEq/L (ref 135–145)

## 2011-12-31 LAB — CBC
MCH: 37.2 pg — ABNORMAL HIGH (ref 26.0–34.0)
MCHC: 36.6 g/dL — ABNORMAL HIGH (ref 30.0–36.0)
MCV: 101.5 fL — ABNORMAL HIGH (ref 78.0–100.0)
Platelets: 163 10*3/uL (ref 150–400)
RBC: 1.99 MIL/uL — ABNORMAL LOW (ref 3.87–5.11)

## 2011-12-31 NOTE — Progress Notes (Signed)
Subjective:  Patient for a slowly feeling better. She rates her pain as a 6/10. There's been no new complaints. She denies chest pains or abdominal pain at this time. Continues to have pain in her joints. She does not have an incentive spirometry.   Allergies  Allergen Reactions  . Latex Other (See Comments)    REACTION: Pt experiences a burning sensation on contacted skin areas  . Lisinopril Other (See Comments) and Cough    REACTION: Sore/scratchy throat  . Tape Other (See Comments)    REACTION: Pt. Experiences a burning sensation on contacted skin areas  . Tylenol (Acetaminophen) Other (See Comments)    Pt. Does not take the following due to protection of kidney health. Past occurrence of Protein in urine.   Current Facility-Administered Medications  Medication Dose Route Frequency Provider Last Rate Last Dose  . azithromycin (ZITHROMAX) 500 mg in dextrose 5 % 250 mL IVPB  500 mg Intravenous Q24H Gwenyth Bender, MD   500 mg at 12/31/11 1006  . dextrose 5 %-0.45 % sodium chloride infusion   Intravenous Continuous Gwenyth Bender, MD 125 mL/hr at 12/31/11 2005    . diphenhydrAMINE (BENADRYL) capsule 25-50 mg  25-50 mg Oral Q4H PRN Gwenyth Bender, MD      . HYDROmorphone (DILAUDID) injection 4 mg  4 mg Intravenous Q2H PRN Gwenyth Bender, MD   4 mg at 12/31/11 2220  . hydroxyurea (HYDREA) capsule 1,000 mg  1,000 mg Oral Custom Gwenyth Bender, MD   1,000 mg at 12/29/11 1142  . hydroxyurea (HYDREA) capsule 500 mg  500 mg Oral Custom Robynn Pane, MD   500 mg at 12/31/11 1007  . ondansetron (ZOFRAN) tablet 4 mg  4 mg Oral Q4H PRN Gwenyth Bender, MD   4 mg at 12/31/11 2225   Or  . ondansetron (ZOFRAN) injection 4 mg  4 mg Intravenous Q4H PRN Gwenyth Bender, MD   4 mg at 12/31/11 1006  . pantoprazole (PROTONIX) EC tablet 40 mg  40 mg Oral Q1200 Robynn Pane, MD   40 mg at 12/31/11 1156  . senna-docusate (Senokot-S) tablet 1 tablet  1 tablet Oral BID PRN Robynn Pane, MD   1 tablet at 12/31/11 1434  .  traMADol (ULTRAM) tablet 50 mg  50 mg Oral Q6H PRN Robynn Pane, MD        Objective: Blood pressure 110/74, pulse 81, temperature 98.7 F (37.1 C), temperature source Oral, resp. rate 18, height 5\' 3"  (1.6 m), weight 144 lb 9.6 oz (65.59 kg), SpO2 95.00%.  Well-developed well-nourished ill-appearing black female in no acute distress. HEENT: No sinus tenderness. No sclera icterus. TMs with decreased light reflex. NECK: No enlarged thyroid. No posterior cervical nodes. LUNGS: Clearer. No coarse breath sounds. No vocal fremitus. CV: Normal S1, S2 without S3. ABD: No epigastric tenderness. MSK: Tenderness in the lower back, knees and ankles. NEURO: Intact.  Lab results: Results for orders placed during the hospital encounter of 12/28/11 (from the past 48 hour(s))  CBC     Status: Abnormal   Collection Time   12/31/11  3:45 AM      Component Value Range Comment   WBC 7.2  4.0 - 10.5 (K/uL)    RBC 1.99 (*) 3.87 - 5.11 (MIL/uL)    Hemoglobin 7.4 (*) 12.0 - 15.0 (g/dL)    HCT 45.4 (*) 09.8 - 46.0 (%)    MCV 101.5 (*) 78.0 - 100.0 (fL)  MCH 37.2 (*) 26.0 - 34.0 (pg)    MCHC 36.6 (*) 30.0 - 36.0 (g/dL) SICKLE CELLS   RDW 16.1 (*) 11.5 - 15.5 (%)    Platelets 163  150 - 400 (K/uL) PLATELET COUNT CONFIRMED BY SMEAR  BASIC METABOLIC PANEL     Status: Abnormal   Collection Time   12/31/11  3:45 AM      Component Value Range Comment   Sodium 138  135 - 145 (mEq/L)    Potassium 3.6  3.5 - 5.1 (mEq/L)    Chloride 103  96 - 112 (mEq/L)    CO2 29  19 - 32 (mEq/L)    Glucose, Bld 111 (*) 70 - 99 (mg/dL)    BUN 4 (*) 6 - 23 (mg/dL)    Creatinine, Ser 0.96  0.50 - 1.10 (mg/dL)    Calcium 8.5  8.4 - 10.5 (mg/dL)    GFR calc non Af Amer >90  >90 (mL/min)    GFR calc Af Amer >90  >90 (mL/min)     Studies/Results: No results found.  Patient Active Problem List  Diagnoses  . Sickle cell crisis  . Sickle cell disease  . Elevated LFTs  . Sickle cell anemia  . Fever  . Leukocytosis  .  HTN (hypertension)    Impression: Sickle cell anemia with ongoing crisis. Upper respiratory infection. Leukocytosis secondary to #1 and 2 resolving.   Plan: Followup her CBC, CMET in a.m. Consider exchange transfusion. She would need a PICC line for this. Continue pain regimen her present level.   August Saucer, Evangelyn Crouse 12/31/2011 11:07 PM

## 2011-12-31 NOTE — Progress Notes (Signed)
Clinical Social Work Department BRIEF PSYCHOSOCIAL ASSESSMENT 12/31/2011  Patient:  Dawn [name],Dawn [name]     Account Number:  [id number]     Admit date:  12/28/2011  Clinical Social Worker:  Eddie Candle  Date/Time:  12/31/2011 01:00 PM  Referred by:  Physician  Date Referred:  12/31/2011 Referred for  Other - See comment   Other Referral:   psychosocial support   Interview type:  Patient Other interview type:    PSYCHOSOCIAL DATA Living Status:  WITH MINOR CHILDREN Admitted from facility:   Level of care:   Primary support name:  Mother Primary support relationship to patient:  PARENT Degree of support available:   good    CURRENT CONCERNS Current Concerns  Financial Resources   Other Concerns:    SOCIAL WORK ASSESSMENT / PLAN CSWN met with patient and family. Patient stated that she is well but having some difficulty managing through some financial challenges.   Assessment/plan status:  Information/Referral to [facility] Other assessment/ plan:   Information/referral to community resources:   CSWN referred to [facility] and supported patient in identify other resources that may be helpful.    PATIENT'S/FAMILY'S RESPONSE TO PLAN OF CARE: patient was in agreement with direction and support.

## 2012-01-01 LAB — CBC
MCH: 37.1 pg — ABNORMAL HIGH (ref 26.0–34.0)
MCV: 100.5 fL — ABNORMAL HIGH (ref 78.0–100.0)
Platelets: 166 10*3/uL (ref 150–400)
RBC: 1.97 MIL/uL — ABNORMAL LOW (ref 3.87–5.11)
RDW: 21.9 % — ABNORMAL HIGH (ref 11.5–15.5)
WBC: 6.7 10*3/uL (ref 4.0–10.5)

## 2012-01-01 LAB — COMPREHENSIVE METABOLIC PANEL
ALT: 43 U/L — ABNORMAL HIGH (ref 0–35)
AST: 85 U/L — ABNORMAL HIGH (ref 0–37)
Albumin: 2.8 g/dL — ABNORMAL LOW (ref 3.5–5.2)
Calcium: 8.7 mg/dL (ref 8.4–10.5)
Creatinine, Ser: 0.59 mg/dL (ref 0.50–1.10)
Sodium: 140 mEq/L (ref 135–145)
Total Protein: 6.7 g/dL (ref 6.0–8.3)

## 2012-01-01 MED ORDER — ACETAMINOPHEN 325 MG PO TABS: 650.0000 mg | ORAL_TABLET | ORAL | Status: DC | PRN

## 2012-01-01 MED ORDER — MUPIROCIN 2 % EX OINT
TOPICAL_OINTMENT | Freq: Two times a day (BID) | CUTANEOUS | Status: DC
  Administered 2012-01-01 – 2012-01-09 (×15): via NASAL
  Filled 2012-01-01: qty 22

## 2012-01-01 MED ORDER — PROMETHAZINE HCL 25 MG/ML IJ SOLN
12.5000 mg | INTRAMUSCULAR | Status: DC | PRN
  Administered 2012-01-01 – 2012-01-04 (×10): 25 mg via INTRAVENOUS
  Administered 2012-01-05 (×2): 12.5 mg via INTRAVENOUS
  Administered 2012-01-05: 25 mg via INTRAVENOUS
  Administered 2012-01-05 – 2012-01-06 (×2): 12.5 mg via INTRAVENOUS
  Administered 2012-01-06 – 2012-01-07 (×3): 25 mg via INTRAVENOUS
  Administered 2012-01-07: 12.5 mg via INTRAVENOUS
  Administered 2012-01-08: 25 mg via INTRAVENOUS
  Administered 2012-01-08 (×2): 12.5 mg via INTRAVENOUS
  Administered 2012-01-08 – 2012-01-09 (×2): 25 mg via INTRAVENOUS
  Administered 2012-01-09: 12.5 mg via INTRAVENOUS
  Filled 2012-01-01 (×27): qty 1

## 2012-01-01 MED ORDER — LIDOCAINE 5 % EX PTCH
1.0000 | MEDICATED_PATCH | CUTANEOUS | Status: DC
  Administered 2012-01-01 – 2012-01-08 (×8): 1 via TRANSDERMAL
  Filled 2012-01-01 (×11): qty 1

## 2012-01-01 MED ORDER — POLYETHYLENE GLYCOL 3350 17 G PO PACK
17.0000 g | PACK | Freq: Every day | ORAL | Status: DC
  Administered 2012-01-02 – 2012-01-09 (×7): 17 g via ORAL
  Filled 2012-01-01 (×9): qty 1

## 2012-01-01 MED ORDER — ENOXAPARIN SODIUM 40 MG/0.4ML ~~LOC~~ SOLN
40.0000 mg | SUBCUTANEOUS | Status: DC
  Administered 2012-01-01 – 2012-01-08 (×8): 40 mg via SUBCUTANEOUS
  Filled 2012-01-01 (×9): qty 0.4

## 2012-01-01 MED ORDER — AZITHROMYCIN 500 MG PO TABS
500.0000 mg | ORAL_TABLET | Freq: Every day | ORAL | Status: DC
  Administered 2012-01-02 – 2012-01-09 (×8): 500 mg via ORAL
  Filled 2012-01-01 (×8): qty 1

## 2012-01-01 MED ORDER — DIPHENHYDRAMINE HCL 50 MG/ML IJ SOLN
25.0000 mg | INTRAMUSCULAR | Status: DC | PRN
  Administered 2012-01-01 – 2012-01-09 (×8): 25 mg via INTRAVENOUS
  Filled 2012-01-01 (×9): qty 1

## 2012-01-01 MED ORDER — ACETAMINOPHEN 325 MG PO TABS
650.0000 mg | ORAL_TABLET | Freq: Once | ORAL | Status: AC
  Administered 2012-01-01: 650 mg via ORAL

## 2012-01-01 NOTE — Progress Notes (Signed)
Patient has needed pain medication every 2 hours this shift with a pain score never falling below 5.  Per patient Dr. August Saucer proposed inserting PICC line at some point today for exchange transfusion.  Patient has been given educational material on PICC placement and would like to know when this will take place.   Kathalene Sporer, Fonda Kinder

## 2012-01-01 NOTE — Progress Notes (Signed)
PHARMACIST - PHYSICIAN COMMUNICATION DR:   August Saucer CONCERNING: Antibiotic IV to Oral Route Change Policy  RECOMMENDATION: This patient is receiving Zithromax by the intravenous route.  Based on criteria approved by the Pharmacy and Therapeutics Committee, the antibiotic(s) is/are being converted to the equivalent oral dose form(s).   DESCRIPTION: These criteria include:  Patient being treated for a respiratory tract infection, urinary tract infection, or cellulitis  The patient is not neutropenic and does not exhibit a GI malabsorption state  The patient is eating (either orally or via tube) and/or has been taking other orally administered medications for a least 24 hours  The patient is improving clinically and has a Tmax < 100.5  If you have questions about this conversion, please contact the Pharmacy Department  []   [phone] )  [facility] []   [phone] )  [facility]  []   [phone] )  [facility] [x]   [phone] )  [facility]    Hessie Knows, PharmD, BCPS pager [phone] 01/01/2012 1:08 PM

## 2012-01-02 LAB — COMPREHENSIVE METABOLIC PANEL
Albumin: 2.8 g/dL — ABNORMAL LOW (ref 3.5–5.2)
BUN: 4 mg/dL — ABNORMAL LOW (ref 6–23)
Calcium: 8.5 mg/dL (ref 8.4–10.5)
Chloride: 103 mEq/L (ref 96–112)
Creatinine, Ser: 0.76 mg/dL (ref 0.50–1.10)
GFR calc non Af Amer: >90 mL/min (ref >90)
Total Bilirubin: 6.1 mg/dL — ABNORMAL HIGH (ref 0.3–1.2)

## 2012-01-02 LAB — CBC
HCT: 21.4 % — ABNORMAL LOW (ref 36.0–46.0)
MCH: 35.3 pg — ABNORMAL HIGH (ref 26.0–34.0)
MCV: 95.5 fL (ref 78.0–100.0)
RDW: 21.8 % — ABNORMAL HIGH (ref 11.5–15.5)
WBC: 7.9 10*3/uL (ref 4.0–10.5)

## 2012-01-02 MED ORDER — SODIUM CHLORIDE 0.9 % IJ SOLN: 10.0000 mL | INTRAMUSCULAR | Status: DC | PRN

## 2012-01-02 MED ORDER — SODIUM CHLORIDE 0.9 % IJ SOLN
10.0000 mL | Freq: Two times a day (BID) | INTRAMUSCULAR | Status: DC
  Administered 2012-01-02: 20 mL
  Administered 2012-01-03 – 2012-01-07 (×10): 10 mL
  Administered 2012-01-08: 20 mL
  Administered 2012-01-09: 10 mL

## 2012-01-02 NOTE — Progress Notes (Signed)
Subjective:  Patient is slowly feeling better. She now rates her pain as a 4-5/10. This is mainly in her lower back. On review she acknowledges she has been under increased stress with her son being diagnosed with ADHD. She knows she has difficulty keeping up with him due to his increased energy. She denies chest pains presently. She's been using incentive spirometer on regular basis. Feels she should be able to go home by tomorrow.   Allergies  Allergen Reactions  . Latex Other (See Comments)    REACTION: Pt experiences a burning sensation on contacted skin areas  . Lisinopril Other (See Comments) and Cough    REACTION: Sore/scratchy throat  . Tape Other (See Comments)    REACTION: Pt. Experiences a burning sensation on contacted skin areas  . Tylenol (Acetaminophen) Other (See Comments)    Pt. Does not take the following due to protection of kidney health. Past occurrence of Protein in urine.   Current Facility-Administered Medications  Medication Dose Route Frequency Provider Last Rate Last Dose  . acetaminophen (TYLENOL) tablet 650 mg  650 mg Oral Once Gwenyth Bender, MD   650 mg at 01/01/12 2202  . azithromycin (ZITHROMAX) tablet 500 mg  500 mg Oral Daily Berkley Harvey, PHARMD   500 mg at 01/02/12 1028  . dextrose 5 %-0.45 % sodium chloride infusion   Intravenous Continuous Gwenyth Bender, MD 125 mL/hr at 01/02/12 1210    . diphenhydrAMINE (BENADRYL) capsule 25-50 mg  25-50 mg Oral Q4H PRN Gwenyth Bender, MD      . diphenhydrAMINE (BENADRYL) injection 25 mg  25 mg Intravenous Q4H PRN Gwenyth Bender, MD   25 mg at 01/02/12 1821  . enoxaparin (LOVENOX) injection 40 mg  40 mg Subcutaneous Q24H Gwenyth Bender, MD   40 mg at 01/02/12 2130  . HYDROmorphone (DILAUDID) injection 4 mg  4 mg Intravenous Q2H PRN Gwenyth Bender, MD   4 mg at 01/02/12 2043  . hydroxyurea (HYDREA) capsule 1,000 mg  1,000 mg Oral Custom Gwenyth Bender, MD   1,000 mg at 01/01/12 0919  . hydroxyurea (HYDREA) capsule 500 mg  500 mg  Oral Custom Robynn Pane, MD   500 mg at 01/02/12 1029  . lidocaine (LIDODERM) 5 % 1 patch  1 patch Transdermal Q24H Gwenyth Bender, MD   1 patch at 01/02/12 2130  . mupirocin ointment (BACTROBAN) 2 %   Nasal BID Gwenyth Bender, MD      . ondansetron [facility]) tablet 4 mg  4 mg Oral Q4H PRN Gwenyth Bender, MD   4 mg at 12/31/11 2225   Or  . ondansetron (ZOFRAN) injection 4 mg  4 mg Intravenous Q4H PRN Gwenyth Bender, MD   4 mg at 01/01/12 1837  . pantoprazole (PROTONIX) EC tablet 40 mg  40 mg Oral Q1200 Robynn Pane, MD   40 mg at 01/02/12 1208  . polyethylene glycol (MIRALAX / GLYCOLAX) packet 17 g  17 g Oral Daily Gwenyth Bender, MD   17 g at 01/02/12 1028  . promethazine (PHENERGAN) injection 12.5-25 mg  12.5-25 mg Intravenous Q3H PRN Gwenyth Bender, MD   25 mg at 01/02/12 2043  . senna-docusate (Senokot-S) tablet 1 tablet  1 tablet Oral BID PRN Robynn Pane, MD   1 tablet at 12/31/11 1434  . sodium chloride 0.9 % injection 10-40 mL  10-40 mL Intracatheter PRN Gwenyth Bender, MD      .  sodium chloride 0.9 % injection 10-40 mL  10-40 mL Intracatheter Q12H Gwenyth Bender, MD   20 mL at 01/02/12 2131  . traMADol (ULTRAM) tablet 50 mg  50 mg Oral Q6H PRN Robynn Pane, MD      . DISCONTD: acetaminophen (TYLENOL) tablet 650 mg  650 mg Oral Q4H PRN Gwenyth Bender, MD        Objective: Blood pressure 110/70, pulse 95, temperature 99.2 F (37.3 C), temperature source Oral, resp. rate 16, height 5\' 3"  (1.6 m), weight 144 lb 9.6 oz (65.59 kg), SpO2 99.00%.  Well-developed well-nourished black female in no acute distress at this time. HEENT: No sinus tenderness. Mild scleral icterus.  NECK: No posterior cervical nodes. LUNGS: Clear to auscultation. No vocal fremitus. CV: Normal S1, S2 without S3. ABD: No epigastric tenderness. MSK: Negative Homans. NEURO: Intact.  Lab results: Results for orders placed during the hospital encounter of 12/28/11 (from the past 48 hour(s))  TYPE AND SCREEN     Status: Normal  (Preliminary result)   Collection Time   01/01/12  8:00 AM      Component Value Range Comment   ABO/RH(D) A POS      Antibody Screen POS      Sample Expiration 01/04/2012      DAT, IgG NEG      Antibody Identification ANTI-E      Unit Number [id number]      Blood Component Type RED CELLS,LR      Unit division 00      Status of Unit ISSUED,FINAL      Donor AG Type        Value: NEGATIVE FOR KELL ANTIGEN NEGATIVE FOR C ANTIGEN NEGATIVE FOR E ANTIGEN NEGATIVE FOR S ANTIGEN NEGATIVE FOR DUFFY A ANTIGEN NEGATIVE FOR LEWIS A ANTIGEN NEGATIVE FOR JSa (SUTTER) ANTIGEN   Transfusion Status OK TO TRANSFUSE      Crossmatch Result COMPATIBLE      Unit Number [id number]      Blood Component Type RED CELLS,LR      Unit division 00      Status of Unit ISSUED      Donor AG Type        Value: NEGATIVE FOR KELL ANTIGEN NEGATIVE FOR C ANTIGEN NEGATIVE FOR E ANTIGEN NEGATIVE FOR S ANTIGEN NEGATIVE FOR DUFFY A ANTIGEN NEGATIVE FOR LEWIS A ANTIGEN NEGATIVE FOR JSa (SUTTER) ANTIGEN   Transfusion Status OK TO TRANSFUSE      Crossmatch Result COMPATIBLE     PREPARE RBC (CROSSMATCH)     Status: Normal   Collection Time   01/01/12  8:00 AM      Component Value Range Comment   Order Confirmation ORDER PROCESSED BY BLOOD BANK     CBC     Status: Abnormal   Collection Time   01/01/12  8:01 AM      Component Value Range Comment   WBC 6.7  4.0 - 10.5 (K/uL)    RBC 1.97 (*) 3.87 - 5.11 (MIL/uL)    Hemoglobin 7.3 (*) 12.0 - 15.0 (g/dL)    HCT 13.0 (*) 86.5 - 46.0 (%)    MCV 100.5 (*) 78.0 - 100.0 (fL)    MCH 37.1 (*) 26.0 - 34.0 (pg)    MCHC 36.9 (*) 30.0 - 36.0 (g/dL) SICKLE CELLS   RDW 78.4 (*) 11.5 - 15.5 (%)    Platelets 166  150 - 400 (K/uL)   COMPREHENSIVE METABOLIC PANEL     Status: Abnormal  Collection Time   01/01/12  8:01 AM      Component Value Range Comment   Sodium 140  135 - 145 (mEq/L)    Potassium 3.5  3.5 - 5.1 (mEq/L)    Chloride 103  96 - 112 (mEq/L)    CO2 31  19 - 32 (mEq/L)    Glucose,  Bld 94  70 - 99 (mg/dL)    BUN 3 (*) 6 - 23 (mg/dL)    Creatinine, Ser 4.09  0.50 - 1.10 (mg/dL)    Calcium 8.7  8.4 - 10.5 (mg/dL)    Total Protein 6.7  6.0 - 8.3 (g/dL)    Albumin 2.8 (*) 3.5 - 5.2 (g/dL)    AST 85 (*) 0 - 37 (U/L)    ALT 43 (*) 0 - 35 (U/L)    Alkaline Phosphatase 173 (*) 39 - 117 (U/L)    Total Bilirubin 5.5 (*) 0.3 - 1.2 (mg/dL)    GFR calc non Af Amer >90  >90 (mL/min)    GFR calc Af Amer >90  >90 (mL/min)   CBC     Status: Abnormal   Collection Time   01/02/12  5:40 AM      Component Value Range Comment   WBC 7.9  4.0 - 10.5 (K/uL)    RBC 2.24 (*) 3.87 - 5.11 (MIL/uL)    Hemoglobin 7.9 (*) 12.0 - 15.0 (g/dL)    HCT 81.1 (*) 91.4 - 46.0 (%)    MCV 95.5  78.0 - 100.0 (fL)    MCH 35.3 (*) 26.0 - 34.0 (pg)    MCHC 36.9 (*) 30.0 - 36.0 (g/dL) SICKLE CELLS   RDW 78.2 (*) 11.5 - 15.5 (%)    Platelets 147 (*) 150 - 400 (K/uL) PLATELET COUNT CONFIRMED BY SMEAR  COMPREHENSIVE METABOLIC PANEL     Status: Abnormal   Collection Time   01/02/12  5:40 AM      Component Value Range Comment   Sodium 138  135 - 145 (mEq/L)    Potassium 3.7  3.5 - 5.1 (mEq/L)    Chloride 103  96 - 112 (mEq/L)    CO2 29  19 - 32 (mEq/L)    Glucose, Bld 110 (*) 70 - 99 (mg/dL)    BUN 4 (*) 6 - 23 (mg/dL)    Creatinine, Ser 9.56  0.50 - 1.10 (mg/dL)    Calcium 8.5  8.4 - 10.5 (mg/dL)    Total Protein 6.8  6.0 - 8.3 (g/dL)    Albumin 2.8 (*) 3.5 - 5.2 (g/dL)    AST 93 (*) 0 - 37 (U/L)    ALT 47 (*) 0 - 35 (U/L)    Alkaline Phosphatase 181 (*) 39 - 117 (U/L)    Total Bilirubin 6.1 (*) 0.3 - 1.2 (mg/dL)    GFR calc non Af Amer >90  >90 (mL/min)    GFR calc Af Amer >90  >90 (mL/min)     Studies/Results: No results found.  Patient Active Problem List  Diagnoses  . Sickle cell crisis  . Sickle cell disease  . Elevated LFTs  . Sickle cell anemia  . Fever  . Leukocytosis  . HTN (hypertension)    Impression: Sickle cell crisis. Slowly resolving active hemolysis. Status post  upper respiratory infection improving. Sickle cell lung disease. Hypertension history.   Plan: Continue present therapy. Reinforce incentive spirometry. Progressed to home within the next 24-48 hours.   August Saucer, Lio Wehrly 01/02/2012 9:48 PM

## 2012-01-02 NOTE — Progress Notes (Signed)
Subjective:  Patient reports ongoing pain mainly in the back. She notes his pain as 8/10. She did not get her PICC line placed until tonight. She is still due for an exchange transfusion. She denies chest pains, palpitations. She had transient nausea and vomiting.   Allergies  Allergen Reactions  . Latex Other (See Comments)    REACTION: Pt experiences a burning sensation on contacted skin areas  . Lisinopril Other (See Comments) and Cough    REACTION: Sore/scratchy throat  . Tape Other (See Comments)    REACTION: Pt. Experiences a burning sensation on contacted skin areas  . Tylenol (Acetaminophen) Other (See Comments)    Pt. Does not take the following due to protection of kidney health. Past occurrence of Protein in urine.   Current Facility-Administered Medications  Medication Dose Route Frequency Provider Last Rate Last Dose  . acetaminophen (TYLENOL) tablet 650 mg  650 mg Oral Once Gwenyth Bender, MD   650 mg at 01/01/12 2202  . azithromycin (ZITHROMAX) tablet 500 mg  500 mg Oral Daily Berkley Harvey, [place]      . dextrose 5 %-0.45 % sodium chloride infusion   Intravenous Continuous Gwenyth Bender, MD 125 mL/hr at 01/01/12 2148    . diphenhydrAMINE (BENADRYL) capsule 25-50 mg  25-50 mg Oral Q4H PRN Gwenyth Bender, MD      . diphenhydrAMINE (BENADRYL) injection 25 mg  25 mg Intravenous Q4H PRN Gwenyth Bender, MD   25 mg at 01/01/12 2154  . enoxaparin (LOVENOX) injection 40 mg  40 mg Subcutaneous Q24H Gwenyth Bender, MD   40 mg at 01/01/12 2348  . HYDROmorphone (DILAUDID) injection 4 mg  4 mg Intravenous Q2H PRN Gwenyth Bender, MD   4 mg at 01/01/12 2348  . hydroxyurea (HYDREA) capsule 1,000 mg  1,000 mg Oral Custom Gwenyth Bender, MD   1,000 mg at 01/01/12 0919  . hydroxyurea (HYDREA) capsule 500 mg  500 mg Oral Custom Robynn Pane, MD   500 mg at 12/31/11 1007  . lidocaine (LIDODERM) 5 % 1 patch  1 patch Transdermal Q24H Gwenyth Bender, MD   1 patch at 01/01/12 2347  . mupirocin ointment  (BACTROBAN) 2 %   Nasal BID Gwenyth Bender, MD      . ondansetron [facility]) tablet 4 mg  4 mg Oral Q4H PRN Gwenyth Bender, MD   4 mg at 12/31/11 2225   Or  . ondansetron (ZOFRAN) injection 4 mg  4 mg Intravenous Q4H PRN Gwenyth Bender, MD   4 mg at 01/01/12 1837  . pantoprazole (PROTONIX) EC tablet 40 mg  40 mg Oral Q1200 Robynn Pane, MD   40 mg at 01/01/12 1226  . polyethylene glycol (MIRALAX / GLYCOLAX) packet 17 g  17 g Oral Daily Gwenyth Bender, MD      . promethazine (PHENERGAN) injection 12.5-25 mg  12.5-25 mg Intravenous Q3H PRN Gwenyth Bender, MD   25 mg at 01/01/12 2148  . senna-docusate (Senokot-S) tablet 1 tablet  1 tablet Oral BID PRN Robynn Pane, MD   1 tablet at 12/31/11 1434  . traMADol (ULTRAM) tablet 50 mg  50 mg Oral Q6H PRN Robynn Pane, MD      . DISCONTD: acetaminophen (TYLENOL) tablet 650 mg  650 mg Oral Q4H PRN Gwenyth Bender, MD      . DISCONTD: azithromycin (ZITHROMAX) 500 mg in dextrose 5 % 250 mL IVPB  500  mg Intravenous Q24H Gwenyth Bender, MD   500 mg at 01/01/12 4782    Objective: Blood pressure 119/74, pulse 102, temperature 99 F (37.2 C), temperature source Oral, resp. rate 16, height 5\' 3"  (1.6 m), weight 144 lb 9.6 oz (65.59 kg), SpO2 94.00%.  Well-developed well-nourished black female in no acute distress. HEENT:no sinus tenderness. Mild sclericterus. NECK:no enlarged thyroid. No posterior cervical nodes. LUNGS:clear to auscultation. No wheezes appreciated. NF:AOZHYQ S1, S2 without S3. No chest wall tenderness. MVH:QIONGEX epigastric tenderness. BMW:[id number] in the lower lumbar sacral spine. Negative Homans. NEURO:alert, oriented x3. Exam nonfocal.  Lab results: Results for orders placed during the hospital encounter of 12/28/11 (from the past 48 hour(s))  CBC     Status: Abnormal   Collection Time   12/31/11  3:45 AM      Component Value Range Comment   WBC 7.2  4.0 - 10.5 (K/uL)    RBC 1.99 (*) 3.87 - 5.11 (MIL/uL)    Hemoglobin 7.4 (*) 12.0 - 15.0 (g/dL)     HCT 72.5 (*) 36.6 - 46.0 (%)    MCV 101.5 (*) 78.0 - 100.0 (fL)    MCH 37.2 (*) 26.0 - 34.0 (pg)    MCHC 36.6 (*) 30.0 - 36.0 (g/dL) SICKLE CELLS   RDW 44.0 (*) 11.5 - 15.5 (%)    Platelets 163  150 - 400 (K/uL) PLATELET COUNT CONFIRMED BY SMEAR  BASIC METABOLIC PANEL     Status: Abnormal   Collection Time   12/31/11  3:45 AM      Component Value Range Comment   Sodium 138  135 - 145 (mEq/L)    Potassium 3.6  3.5 - 5.1 (mEq/L)    Chloride 103  96 - 112 (mEq/L)    CO2 29  19 - 32 (mEq/L)    Glucose, Bld 111 (*) 70 - 99 (mg/dL)    BUN 4 (*) 6 - 23 (mg/dL)    Creatinine, Ser 3.47  0.50 - 1.10 (mg/dL)    Calcium 8.5  8.4 - 10.5 (mg/dL)    GFR calc non Af Amer >90  >90 (mL/min)    GFR calc Af Amer >90  >90 (mL/min)   TYPE AND SCREEN     Status: Normal (Preliminary result)   Collection Time   01/01/12  8:00 AM      Component Value Range Comment   ABO/RH(D) A POS      Antibody Screen POS      Sample Expiration 01/04/2012      DAT, IgG NEG      Antibody Identification ANTI-E      Unit Number [id number]      Blood Component Type RED CELLS,LR      Unit division 00      Status of Unit ISSUED      Donor AG Type        Value: NEGATIVE FOR KELL ANTIGEN NEGATIVE FOR C ANTIGEN NEGATIVE FOR E ANTIGEN NEGATIVE FOR S ANTIGEN NEGATIVE FOR DUFFY A ANTIGEN NEGATIVE FOR LEWIS A ANTIGEN NEGATIVE FOR JSa (SUTTER) ANTIGEN   Transfusion Status OK TO TRANSFUSE      Crossmatch Result COMPATIBLE      Unit Number [id number]      Blood Component Type RED CELLS,LR      Unit division 00      Status of Unit ALLOCATED      Donor AG Type        Value: NEGATIVE FOR KELL ANTIGEN NEGATIVE FOR C ANTIGEN  NEGATIVE FOR E ANTIGEN NEGATIVE FOR S ANTIGEN NEGATIVE FOR DUFFY A ANTIGEN NEGATIVE FOR LEWIS A ANTIGEN NEGATIVE FOR JSa (SUTTER) ANTIGEN   Transfusion Status OK TO TRANSFUSE      Crossmatch Result COMPATIBLE     PREPARE RBC (CROSSMATCH)     Status: Normal   Collection Time   01/01/12  8:00 AM      Component Value  Range Comment   Order Confirmation ORDER PROCESSED BY BLOOD BANK     CBC     Status: Abnormal   Collection Time   01/01/12  8:01 AM      Component Value Range Comment   WBC 6.7  4.0 - 10.5 (K/uL)    RBC 1.97 (*) 3.87 - 5.11 (MIL/uL)    Hemoglobin 7.3 (*) 12.0 - 15.0 (g/dL)    HCT 16.1 (*) 09.6 - 46.0 (%)    MCV 100.5 (*) 78.0 - 100.0 (fL)    MCH 37.1 (*) 26.0 - 34.0 (pg)    MCHC 36.9 (*) 30.0 - 36.0 (g/dL) SICKLE CELLS   RDW 04.5 (*) 11.5 - 15.5 (%)    Platelets 166  150 - 400 (K/uL)   COMPREHENSIVE METABOLIC PANEL     Status: Abnormal   Collection Time   01/01/12  8:01 AM      Component Value Range Comment   Sodium 140  135 - 145 (mEq/L)    Potassium 3.5  3.5 - 5.1 (mEq/L)    Chloride 103  96 - 112 (mEq/L)    CO2 31  19 - 32 (mEq/L)    Glucose, Bld 94  70 - 99 (mg/dL)    BUN 3 (*) 6 - 23 (mg/dL)    Creatinine, Ser 4.09  0.50 - 1.10 (mg/dL)    Calcium 8.7  8.4 - 10.5 (mg/dL)    Total Protein 6.7  6.0 - 8.3 (g/dL)    Albumin 2.8 (*) 3.5 - 5.2 (g/dL)    AST 85 (*) 0 - 37 (U/L)    ALT 43 (*) 0 - 35 (U/L)    Alkaline Phosphatase 173 (*) 39 - 117 (U/L)    Total Bilirubin 5.5 (*) 0.3 - 1.2 (mg/dL)    GFR calc non Af Amer >90  >90 (mL/min)    GFR calc Af Amer >90  >90 (mL/min)     Studies/Results: No results found.  Patient Active Problem List  Diagnoses  . Sickle cell crisis  . Sickle cell disease  . Elevated LFTs  . Sickle cell anemia  . Fever  . Leukocytosis  . HTN (hypertension)    Impression: Sickle cell crisis. Active hemolysis, gradually improving. Leukocytosis secondary to #1. Rule out other.   Plan: Continue therapy. Proceed with exchange transfusion. Further adjustment of  pain regimen pending response to the above.   August Saucer, [place] Bowlds 01/02/2012 12:30 AM

## 2012-01-03 LAB — COMPREHENSIVE METABOLIC PANEL
ALT: 43 U/L — ABNORMAL HIGH (ref 0–35)
Albumin: 2.7 g/dL — ABNORMAL LOW (ref 3.5–5.2)
Alkaline Phosphatase: 176 U/L — ABNORMAL HIGH (ref 39–117)
Potassium: 3.6 mEq/L (ref 3.5–5.1)
Sodium: 136 mEq/L (ref 135–145)
Total Protein: 6.6 g/dL (ref 6.0–8.3)

## 2012-01-03 LAB — TYPE AND SCREEN
Antibody Screen: POSITIVE
DAT, IgG: NEGATIVE
Unit division: 0

## 2012-01-03 LAB — PREPARE RBC (CROSSMATCH)

## 2012-01-03 LAB — CBC
MCHC: 36.1 g/dL — ABNORMAL HIGH (ref 30.0–36.0)
Platelets: 172 10*3/uL (ref 150–400)
RDW: 23.6 % — ABNORMAL HIGH (ref 11.5–15.5)

## 2012-01-03 NOTE — Progress Notes (Signed)
Subjective:  Patient feeling better today than yesterday. She rates her pain now is a 5/10. This is mainly in the lower back. No chest pains or palpitations or. No significant leg cramps. She feels that she should be able to manage her pain by tomorrow at home.   Allergies  Allergen Reactions  . Latex Other (See Comments)    REACTION: Pt experiences a burning sensation on contacted skin areas  . Lisinopril Other (See Comments) and Cough    REACTION: Sore/scratchy throat  . Tape Other (See Comments)    REACTION: Pt. Experiences a burning sensation on contacted skin areas  . Tylenol (Acetaminophen) Other (See Comments)    Pt. Does not take the following due to protection of kidney health. Past occurrence of Protein in urine.   Current Facility-Administered Medications  Medication Dose Route Frequency Provider Last Rate Last Dose  . azithromycin (ZITHROMAX) tablet 500 mg  500 mg Oral Daily Berkley Harvey, PHARMD   500 mg at 01/03/12 2130  . dextrose 5 %-0.45 % sodium chloride infusion   Intravenous Continuous Gwenyth Bender, MD 125 mL/hr at 01/03/12 1437 125 mL at 01/03/12 1437  . diphenhydrAMINE (BENADRYL) capsule 25-50 mg  25-50 mg Oral Q4H PRN Gwenyth Bender, MD      . diphenhydrAMINE (BENADRYL) injection 25 mg  25 mg Intravenous Q4H PRN Gwenyth Bender, MD   25 mg at 01/03/12 2126  . enoxaparin (LOVENOX) injection 40 mg  40 mg Subcutaneous Q24H Gwenyth Bender, MD   40 mg at 01/03/12 2125  . HYDROmorphone (DILAUDID) injection 4 mg  4 mg Intravenous Q2H PRN Gwenyth Bender, MD   4 mg at 01/03/12 2125  . hydroxyurea (HYDREA) capsule 1,000 mg  1,000 mg Oral Custom Gwenyth Bender, MD   1,000 mg at 01/01/12 0919  . hydroxyurea (HYDREA) capsule 500 mg  500 mg Oral Custom Robynn Pane, MD   500 mg at 01/03/12 0921  . lidocaine (LIDODERM) 5 % 1 patch  1 patch Transdermal Q24H Gwenyth Bender, MD   1 patch at 01/03/12 2125  . mupirocin ointment (BACTROBAN) 2 %   Nasal BID Gwenyth Bender, MD      . ondansetron  [facility]) tablet 4 mg  4 mg Oral Q4H PRN Gwenyth Bender, MD   4 mg at 12/31/11 2225   Or  . ondansetron (ZOFRAN) injection 4 mg  4 mg Intravenous Q4H PRN Gwenyth Bender, MD   4 mg at 01/01/12 1837  . pantoprazole (PROTONIX) EC tablet 40 mg  40 mg Oral Q1200 Robynn Pane, MD   40 mg at 01/03/12 1241  . polyethylene glycol (MIRALAX / GLYCOLAX) packet 17 g  17 g Oral Daily Gwenyth Bender, MD   17 g at 01/03/12 8657  . promethazine (PHENERGAN) injection 12.5-25 mg  12.5-25 mg Intravenous Q3H PRN Gwenyth Bender, MD   25 mg at 01/03/12 2126  . senna-docusate (Senokot-S) tablet 1 tablet  1 tablet Oral BID PRN Robynn Pane, MD   1 tablet at 12/31/11 1434  . sodium chloride 0.9 % injection 10-40 mL  10-40 mL Intracatheter PRN Gwenyth Bender, MD      . sodium chloride 0.9 % injection 10-40 mL  10-40 mL Intracatheter Q12H Gwenyth Bender, MD   10 mL at 01/03/12 2130  . traMADol (ULTRAM) tablet 50 mg  50 mg Oral Q6H PRN Robynn Pane, MD  Objective: Blood pressure 110/74, pulse 91, temperature 98.9 F (37.2 C), temperature source Oral, resp. rate 18, height 5\' 3"  (1.6 m), weight 146 lb 9.7 oz (66.5 kg), SpO2 97.00%.  Well-developed well-nourished black female in no acute distress. HEENT:no sinus tenderness. Mild sclera icterus. NECK:no posterior cervical nodes. LUNGS:clear to auscultation. No vocal fremitus. FA:OZHYQM S1, S2 without S3. VHQ:IONGEXBMW. UXL:KGMWNU lower lumbar sacral spine. NEURO:intact.  Lab results: Results for orders placed during the hospital encounter of 12/28/11 (from the past 48 hour(s))  CBC     Status: Abnormal   Collection Time   01/02/12  5:40 AM      Component Value Range Comment   WBC 7.9  4.0 - 10.5 (K/uL)    RBC 2.24 (*) 3.87 - 5.11 (MIL/uL)    Hemoglobin 7.9 (*) 12.0 - 15.0 (g/dL)    HCT 27.2 (*) 53.6 - 46.0 (%)    MCV 95.5  78.0 - 100.0 (fL)    MCH 35.3 (*) 26.0 - 34.0 (pg)    MCHC 36.9 (*) 30.0 - 36.0 (g/dL) SICKLE CELLS   RDW 64.4 (*) 11.5 - 15.5 (%)    Platelets  147 (*) 150 - 400 (K/uL) PLATELET COUNT CONFIRMED BY SMEAR  COMPREHENSIVE METABOLIC PANEL     Status: Abnormal   Collection Time   01/02/12  5:40 AM      Component Value Range Comment   Sodium 138  135 - 145 (mEq/L)    Potassium 3.7  3.5 - 5.1 (mEq/L)    Chloride 103  96 - 112 (mEq/L)    CO2 29  19 - 32 (mEq/L)    Glucose, Bld 110 (*) 70 - 99 (mg/dL)    BUN 4 (*) 6 - 23 (mg/dL)    Creatinine, Ser 0.34  0.50 - 1.10 (mg/dL)    Calcium 8.5  8.4 - 10.5 (mg/dL)    Total Protein 6.8  6.0 - 8.3 (g/dL)    Albumin 2.8 (*) 3.5 - 5.2 (g/dL)    AST 93 (*) 0 - 37 (U/L)    ALT 47 (*) 0 - 35 (U/L)    Alkaline Phosphatase 181 (*) 39 - 117 (U/L)    Total Bilirubin 6.1 (*) 0.3 - 1.2 (mg/dL)    GFR calc non Af Amer >90  >90 (mL/min)    GFR calc Af Amer >90  >90 (mL/min)   PREPARE RBC (CROSSMATCH)     Status: Normal   Collection Time   01/02/12  4:30 PM      Component Value Range Comment   Order Confirmation ORDER PROCESSED BY BLOOD BANK     CBC     Status: Abnormal   Collection Time   01/03/12  6:47 AM      Component Value Range Comment   WBC 7.4  4.0 - 10.5 (K/uL)    RBC 2.66 (*) 3.87 - 5.11 (MIL/uL)    Hemoglobin 9.0 (*) 12.0 - 15.0 (g/dL)    HCT 74.2 (*) 59.5 - 46.0 (%)    MCV 93.6  78.0 - 100.0 (fL)    MCH 33.8  26.0 - 34.0 (pg)    MCHC 36.1 (*) 30.0 - 36.0 (g/dL)    RDW 63.8 (*) 75.6 - 15.5 (%)    Platelets 172  150 - 400 (K/uL)   COMPREHENSIVE METABOLIC PANEL     Status: Abnormal   Collection Time   01/03/12  6:47 AM      Component Value Range Comment   Sodium 136  135 -  145 (mEq/L)    Potassium 3.6  3.5 - 5.1 (mEq/L)    Chloride 102  96 - 112 (mEq/L)    CO2 30  19 - 32 (mEq/L)    Glucose, Bld 100 (*) 70 - 99 (mg/dL)    BUN 5 (*) 6 - 23 (mg/dL)    Creatinine, Ser 1.61  0.50 - 1.10 (mg/dL)    Calcium 8.5  8.4 - 10.5 (mg/dL)    Total Protein 6.6  6.0 - 8.3 (g/dL)    Albumin 2.7 (*) 3.5 - 5.2 (g/dL)    AST 84 (*) 0 - 37 (U/L)    ALT 43 (*) 0 - 35 (U/L)    Alkaline Phosphatase 176  (*) 39 - 117 (U/L)    Total Bilirubin 5.9 (*) 0.3 - 1.2 (mg/dL)    GFR calc non Af Amer >90  >90 (mL/min)    GFR calc Af Amer >90  >90 (mL/min)     Studies/Results: No results found.  Patient Active Problem List  Diagnoses  . Sickle cell crisis  . Sickle cell disease  . Elevated LFTs  . Sickle cell anemia  . Fever  . Leukocytosis  . HTN (hypertension)    Impression: Resolving sickle cell crisis. Active hemolysis gradually decreasing. Situational stress. Status post recent upper rest her infection.   Plan: Continue present therapy. Transition to Zithromax orally. Anticipate discharge home tomorrow.   August Saucer, Coralie Stanke 01/03/2012 10:48 PM

## 2012-01-04 LAB — CBC
HCT: 24.6 % — ABNORMAL LOW (ref 36.0–46.0)
MCHC: 35.4 g/dL (ref 30.0–36.0)
MCV: 94.3 fL (ref 78.0–100.0)
RDW: 24.2 % — ABNORMAL HIGH (ref 11.5–15.5)
WBC: 6.9 10*3/uL (ref 4.0–10.5)

## 2012-01-04 LAB — COMPREHENSIVE METABOLIC PANEL
Albumin: 2.8 g/dL — ABNORMAL LOW (ref 3.5–5.2)
BUN: 3 mg/dL — ABNORMAL LOW (ref 6–23)
Chloride: 104 mEq/L (ref 96–112)
Creatinine, Ser: 0.58 mg/dL (ref 0.50–1.10)
Total Bilirubin: 5.9 mg/dL — ABNORMAL HIGH (ref 0.3–1.2)

## 2012-01-05 LAB — COMPREHENSIVE METABOLIC PANEL
ALT: 49 U/L — ABNORMAL HIGH (ref 0–35)
AST: 90 U/L — ABNORMAL HIGH (ref 0–37)
CO2: 27 mEq/L (ref 19–32)
Chloride: 104 mEq/L (ref 96–112)
Creatinine, Ser: 0.54 mg/dL (ref 0.50–1.10)
GFR calc Af Amer: >90 mL/min (ref >90)
Potassium: 3.1 mEq/L — ABNORMAL LOW (ref 3.5–5.1)
Sodium: 139 mEq/L (ref 135–145)

## 2012-01-05 LAB — CBC
MCV: 94.8 fL (ref 78.0–100.0)
Platelets: 177 10*3/uL (ref 150–400)
RBC: 2.5 MIL/uL — ABNORMAL LOW (ref 3.87–5.11)
WBC: 6.5 10*3/uL (ref 4.0–10.5)

## 2012-01-05 MED ORDER — POTASSIUM CHLORIDE CRYS ER 20 MEQ PO TBCR
20.0000 meq | EXTENDED_RELEASE_TABLET | Freq: Three times a day (TID) | ORAL | Status: AC
  Administered 2012-01-05 – 2012-01-06 (×6): 20 meq via ORAL
  Filled 2012-01-05 (×6): qty 1

## 2012-01-05 NOTE — Progress Notes (Signed)
Subjective:  Feeling better. No chest pain. + back pain  Objective:  Vital Signs in the last 24 hours: Temp:  [98 F (36.7 C)-99 F (37.2 C)] 98 F (36.7 C) (04/13 0614) Pulse Rate:  [84-95] 95  (04/13 0614) Cardiac Rhythm:  [-]  Resp:  [16-18] 16  (04/13 0614) BP: (99-110)/(64-71) 107/71 mmHg (04/13 0614) SpO2:  [91 %-97 %] 95 % (04/13 0614) Weight:  [67.1 kg (147 lb 14.9 oz)] 67.1 kg (147 lb 14.9 oz) (04/13 1610)  Physical Exam: BP Readings from Last 1 Encounters:  01/05/12 107/71    Wt Readings from Last 1 Encounters:  01/05/12 67.1 kg (147 lb 14.9 oz)    Weight change: 0.512 kg (1 lb 2.1 oz)  HEENT: /AT, Eyes-Brown, PERL, EOMI, Conjunctiva-Pale, Sclera-icteric Neck: No JVD, No bruit, Trachea midline. Lungs:  Clear, Bilateral. Cardiac:  Regular rhythm, normal S1 and S2, no S3.  Abdomen:  Soft, non-tender. Extremities:  No edema present. No cyanosis. No clubbing. CNS: AxOx3, Cranial nerves grossly intact, moves all 4 extremities. Right handed. Skin: Warm and dry.   Intake/Output from previous day: 04/12 0701 - 04/13 0700 In: 5494.4 [P.O.:700; I.V.:4794.4] Out: -     Lab Results: BMET    Component Value Date/Time   NA 139 01/05/2012 0532   K 3.1* 01/05/2012 0532   CL 104 01/05/2012 0532   CO2 27 01/05/2012 0532   GLUCOSE 108* 01/05/2012 0532   BUN 4* 01/05/2012 0532   CREATININE 0.54 01/05/2012 0532   CALCIUM 8.4 01/05/2012 0532   GFRNONAA >90 01/05/2012 0532   GFRAA >90 01/05/2012 0532   CBC    Component Value Date/Time   WBC 6.5 01/05/2012 0532   RBC 2.50* 01/05/2012 0532   HGB 8.4* 01/05/2012 0532   HCT 23.7* 01/05/2012 0532   PLT 177 01/05/2012 0532   MCV 94.8 01/05/2012 0532   MCH 33.6 01/05/2012 0532   MCHC 35.4 01/05/2012 0532   RDW 24.0* 01/05/2012 0532   LYMPHSABS 2.2 12/26/2011 1505   MONOABS 1.3* 12/26/2011 1505   EOSABS 0.0 12/26/2011 1505   BASOSABS 0.0 12/26/2011 1505   CARDIAC ENZYMES Lab Results  Component Value Date   CKTOTAL 21 08/20/2010   CKMB  0.4 08/20/2010   TROPONINI <0.30 09/24/2011    Assessment/Plan:  Patient Active Hospital Problem List:  Resolving sickle cell crisis.  Hypertension by history.  Status post recent upper respiratory tract infection Hypokalemia  K+ replacement   LOS: 8 days    Orpah Vanantwerp  MD  01/05/2012, 10:51 AM

## 2012-01-05 NOTE — Progress Notes (Signed)
Subjective:  Patient continues to slowly improve. She denies any new sources of pain. Pain in her back is presently at a 5/10. She is still not quite able to manage her pain at home. Denies fever chills or night sweats. No other new complaints.   Allergies  Allergen Reactions  . Latex Other (See Comments)    REACTION: Pt experiences a burning sensation on contacted skin areas  . Lisinopril Other (See Comments) and Cough    REACTION: Sore/scratchy throat  . Tape Other (See Comments)    REACTION: Pt. Experiences a burning sensation on contacted skin areas  . Tylenol (Acetaminophen) Other (See Comments)    Pt. Does not take the following due to protection of kidney health. Past occurrence of Protein in urine.   Current Facility-Administered Medications  Medication Dose Route Frequency Provider Last Rate Last Dose  . azithromycin (ZITHROMAX) tablet 500 mg  500 mg Oral Daily Berkley Harvey, PHARMD   500 mg at 01/04/12 1610  . dextrose 5 %-0.45 % sodium chloride infusion   Intravenous Continuous Gwenyth Bender, MD 125 mL/hr at 01/04/12 2209    . diphenhydrAMINE (BENADRYL) capsule 25-50 mg  25-50 mg Oral Q4H PRN Gwenyth Bender, MD      . diphenhydrAMINE (BENADRYL) injection 25 mg  25 mg Intravenous Q4H PRN Gwenyth Bender, MD   25 mg at 01/03/12 2126  . enoxaparin (LOVENOX) injection 40 mg  40 mg Subcutaneous Q24H Gwenyth Bender, MD   40 mg at 01/04/12 2200  . HYDROmorphone (DILAUDID) injection 4 mg  4 mg Intravenous Q2H PRN Gwenyth Bender, MD   4 mg at 01/04/12 2200  . hydroxyurea (HYDREA) capsule 1,000 mg  1,000 mg Oral Custom Gwenyth Bender, MD   1,000 mg at 01/01/12 0919  . hydroxyurea (HYDREA) capsule 500 mg  500 mg Oral Custom Robynn Pane, MD   500 mg at 01/04/12 0953  . lidocaine (LIDODERM) 5 % 1 patch  1 patch Transdermal Q24H Gwenyth Bender, MD   1 patch at 01/04/12 2204  . mupirocin ointment (BACTROBAN) 2 %   Nasal BID Gwenyth Bender, MD      . ondansetron [facility]) tablet 4 mg  4 mg Oral Q4H PRN  Gwenyth Bender, MD   4 mg at 12/31/11 2225   Or  . ondansetron (ZOFRAN) injection 4 mg  4 mg Intravenous Q4H PRN Gwenyth Bender, MD   4 mg at 01/01/12 1837  . pantoprazole (PROTONIX) EC tablet 40 mg  40 mg Oral Q1200 Robynn Pane, MD   40 mg at 01/04/12 1200  . polyethylene glycol (MIRALAX / GLYCOLAX) packet 17 g  17 g Oral Daily Gwenyth Bender, MD   17 g at 01/04/12 0953  . promethazine (PHENERGAN) injection 12.5-25 mg  12.5-25 mg Intravenous Q3H PRN Gwenyth Bender, MD   25 mg at 01/04/12 1002  . senna-docusate (Senokot-S) tablet 1 tablet  1 tablet Oral BID PRN Robynn Pane, MD   1 tablet at 12/31/11 1434  . sodium chloride 0.9 % injection 10-40 mL  10-40 mL Intracatheter PRN Gwenyth Bender, MD      . sodium chloride 0.9 % injection 10-40 mL  10-40 mL Intracatheter Q12H Gwenyth Bender, MD   10 mL at 01/04/12 2207  . traMADol (ULTRAM) tablet 50 mg  50 mg Oral Q6H PRN Robynn Pane, MD        Objective: Blood pressure 99/64, pulse  89, temperature 99 F (37.2 C), temperature source Oral, resp. rate 16, height 5\' 3"  (1.6 m), weight 146 lb 12.8 oz (66.588 kg), SpO2 97.00%.  Well-developed well-nourished black female in no acute distress. HEENT:no sinus tenderness. Mild sclera icterus. NECK:no posterior cervical nodes. LUNGS:clear to auscultation. AV:WUJWJX S1, S2 without S3. BJY:NWGNFAOZH. MSK:no joint tenderness. Negative Homans. NEURO:intact.  Lab results: Results for orders placed during the hospital encounter of 12/28/11 (from the past 48 hour(s))  CBC     Status: Abnormal   Collection Time   01/03/12  6:47 AM      Component Value Range Comment   WBC 7.4  4.0 - 10.5 (K/uL)    RBC 2.66 (*) 3.87 - 5.11 (MIL/uL)    Hemoglobin 9.0 (*) 12.0 - 15.0 (g/dL)    HCT 08.6 (*) 57.8 - 46.0 (%)    MCV 93.6  78.0 - 100.0 (fL)    MCH 33.8  26.0 - 34.0 (pg)    MCHC 36.1 (*) 30.0 - 36.0 (g/dL)    RDW 46.9 (*) 62.9 - 15.5 (%)    Platelets 172  150 - 400 (K/uL)   COMPREHENSIVE METABOLIC PANEL     Status:  Abnormal   Collection Time   01/03/12  6:47 AM      Component Value Range Comment   Sodium 136  135 - 145 (mEq/L)    Potassium 3.6  3.5 - 5.1 (mEq/L)    Chloride 102  96 - 112 (mEq/L)    CO2 30  19 - 32 (mEq/L)    Glucose, Bld 100 (*) 70 - 99 (mg/dL)    BUN 5 (*) 6 - 23 (mg/dL)    Creatinine, Ser 5.28  0.50 - 1.10 (mg/dL)    Calcium 8.5  8.4 - 10.5 (mg/dL)    Total Protein 6.6  6.0 - 8.3 (g/dL)    Albumin 2.7 (*) 3.5 - 5.2 (g/dL)    AST 84 (*) 0 - 37 (U/L)    ALT 43 (*) 0 - 35 (U/L)    Alkaline Phosphatase 176 (*) 39 - 117 (U/L)    Total Bilirubin 5.9 (*) 0.3 - 1.2 (mg/dL)    GFR calc non Af Amer >90  >90 (mL/min)    GFR calc Af Amer >90  >90 (mL/min)   CBC     Status: Abnormal   Collection Time   01/04/12  5:40 AM      Component Value Range Comment   WBC 6.9  4.0 - 10.5 (K/uL)    RBC 2.61 (*) 3.87 - 5.11 (MIL/uL)    Hemoglobin 8.7 (*) 12.0 - 15.0 (g/dL)    HCT 41.3 (*) 24.4 - 46.0 (%)    MCV 94.3  78.0 - 100.0 (fL)    MCH 33.3  26.0 - 34.0 (pg)    MCHC 35.4  30.0 - 36.0 (g/dL)    RDW 01.0 (*) 27.2 - 15.5 (%)    Platelets 178  150 - 400 (K/uL)   COMPREHENSIVE METABOLIC PANEL     Status: Abnormal   Collection Time   01/04/12  5:40 AM      Component Value Range Comment   Sodium 140  135 - 145 (mEq/L)    Potassium 3.6  3.5 - 5.1 (mEq/L)    Chloride 104  96 - 112 (mEq/L)    CO2 29  19 - 32 (mEq/L)    Glucose, Bld 97  70 - 99 (mg/dL)    BUN 3 (*) 6 - 23 (mg/dL)  Creatinine, Ser 0.58  0.50 - 1.10 (mg/dL)    Calcium 8.6  8.4 - 10.5 (mg/dL)    Total Protein 6.6  6.0 - 8.3 (g/dL)    Albumin 2.8 (*) 3.5 - 5.2 (g/dL)    AST 95 (*) 0 - 37 (U/L)    ALT 49 (*) 0 - 35 (U/L)    Alkaline Phosphatase 165 (*) 39 - 117 (U/L)    Total Bilirubin 5.9 (*) 0.3 - 1.2 (mg/dL)    GFR calc non Af Amer >90  >90 (mL/min)    GFR calc Af Amer >90  >90 (mL/min)     Studies/Results: No results found.  Patient Active Problem List  Diagnoses  . Sickle cell crisis  . Sickle cell disease  .  Elevated LFTs  . Sickle cell anemia  . Fever  . Leukocytosis  . HTN (hypertension)    Impression: Resolving sickle cell crisis. Hypertension by history. Status post recent upper respiratory tract infection   Plan: Continue present therapy. We'll hold further exchange transfusions. Progress toward discharge this weekend.   August Saucer, Abigayl Hor 01/05/2012 12:16 AM

## 2012-01-06 LAB — COMPREHENSIVE METABOLIC PANEL
Albumin: 2.7 g/dL — ABNORMAL LOW (ref 3.5–5.2)
Alkaline Phosphatase: 172 U/L — ABNORMAL HIGH (ref 39–117)
BUN: 6 mg/dL (ref 6–23)
CO2: 28 mEq/L (ref 19–32)
Chloride: 105 mEq/L (ref 96–112)
GFR calc non Af Amer: >90 mL/min (ref >90)
Glucose, Bld: 108 mg/dL — ABNORMAL HIGH (ref 70–99)
Potassium: 3.9 mEq/L (ref 3.5–5.1)
Total Bilirubin: 5.9 mg/dL — ABNORMAL HIGH (ref 0.3–1.2)

## 2012-01-06 LAB — CBC
HCT: 23.5 % — ABNORMAL LOW (ref 36.0–46.0)
Hemoglobin: 8.4 g/dL — ABNORMAL LOW (ref 12.0–15.0)
MCV: 93.6 fL (ref 78.0–100.0)
RBC: 2.51 MIL/uL — ABNORMAL LOW (ref 3.87–5.11)
WBC: 7.1 10*3/uL (ref 4.0–10.5)

## 2012-01-06 NOTE — Progress Notes (Signed)
Subjective:  Back pain and nausea.   Objective:  Vital Signs in the last 24 hours: Temp:  [98.3 F (36.8 C)-99.3 F (37.4 C)] 99 F (37.2 C) (04/14 0714) Pulse Rate:  [89-98] 92  (04/14 0714) Cardiac Rhythm:  [-]  Resp:  [16] 16  (04/14 0714) BP: (93-114)/(63-77) 93/63 mmHg (04/14 0714) SpO2:  [91 %-96 %] 93 % (04/14 0714) Weight:  [67.3 kg (148 lb 5.9 oz)] 67.3 kg (148 lb 5.9 oz) (04/14 0714)  Physical Exam: BP Readings from Last 1 Encounters:  01/06/12 93/63    Wt Readings from Last 1 Encounters:  01/06/12 67.3 kg (148 lb 5.9 oz)    Weight change:   HEENT: /AT, Eyes-Brown, PERL, EOMI, Conjunctiva-Pale, Sclera-icteric Neck: No JVD, No bruit, Trachea midline. Lungs:  Clear, Bilateral. Cardiac:  Regular rhythm, normal S1 and S2, no S3.  Abdomen:  Soft, non-tender. Extremities:  No edema present. No cyanosis. No clubbing. CNS: AxOx3, Cranial nerves grossly intact, moves all 4 extremities. Right handed. Skin: Warm and dry.   Intake/Output from previous day: 04/13 0701 - 04/14 0700 In: 3250.3 [P.O.:1200; I.V.:2050.3] Out: 900 [Urine:900]    Lab Results: BMET    Component Value Date/Time   NA 138 01/06/2012 0505   K 3.9 01/06/2012 0505   CL 105 01/06/2012 0505   CO2 28 01/06/2012 0505   GLUCOSE 108* 01/06/2012 0505   BUN 6 01/06/2012 0505   CREATININE 0.61 01/06/2012 0505   CALCIUM 8.5 01/06/2012 0505   GFRNONAA >90 01/06/2012 0505   GFRAA >90 01/06/2012 0505   CBC    Component Value Date/Time   WBC 7.1 01/06/2012 0505   RBC 2.51* 01/06/2012 0505   HGB 8.4* 01/06/2012 0505   HCT 23.5* 01/06/2012 0505   PLT 202 01/06/2012 0505   MCV 93.6 01/06/2012 0505   MCH 33.5 01/06/2012 0505   MCHC 35.7 01/06/2012 0505   RDW 22.8* 01/06/2012 0505   LYMPHSABS 2.2 12/26/2011 1505   MONOABS 1.3* 12/26/2011 1505   EOSABS 0.0 12/26/2011 1505   BASOSABS 0.0 12/26/2011 1505   CARDIAC ENZYMES Lab Results  Component Value Date   CKTOTAL 21 08/20/2010   CKMB 0.4 08/20/2010   TROPONINI <0.30  09/24/2011    Assessment/Plan:  Patient Active Hospital Problem List:  Resolving sickle cell crisis.  Hypertension by history.  Status post recent upper respiratory tract infection  Hypokalemia resolved     LOS: 9 days    Orpah Korol  MD  01/06/2012, 2:36 PM

## 2012-01-07 LAB — COMPREHENSIVE METABOLIC PANEL
AST: 93 U/L — ABNORMAL HIGH (ref 0–37)
Albumin: 2.6 g/dL — ABNORMAL LOW (ref 3.5–5.2)
Calcium: 8.5 mg/dL (ref 8.4–10.5)
Chloride: 103 mEq/L (ref 96–112)
Creatinine, Ser: 0.51 mg/dL (ref 0.50–1.10)
Sodium: 137 mEq/L (ref 135–145)
Total Bilirubin: 5.3 mg/dL — ABNORMAL HIGH (ref 0.3–1.2)

## 2012-01-07 LAB — CBC
MCV: 92.5 fL (ref 78.0–100.0)
Platelets: 217 10*3/uL (ref 150–400)
RDW: 22.4 % — ABNORMAL HIGH (ref 11.5–15.5)
WBC: 7.3 10*3/uL (ref 4.0–10.5)

## 2012-01-07 NOTE — Progress Notes (Signed)
Subjective:  Patient feeling better overall. She had early morning nausea which has improved. Her pain at night is 4-5/10. This is mainly in the left lower back. She denies chest pains or shortness of breath. She's looking forward to going home tomorrow but would like to have another exchange.   Allergies  Allergen Reactions  . Latex Other (See Comments)    REACTION: Pt experiences a burning sensation on contacted skin areas  . Lisinopril Other (See Comments) and Cough    REACTION: Sore/scratchy throat  . Tape Other (See Comments)    REACTION: Pt. Experiences a burning sensation on contacted skin areas  . Tylenol (Acetaminophen) Other (See Comments)    Pt. Does not take the following due to protection of kidney health. Past occurrence of Protein in urine.   Current Facility-Administered Medications  Medication Dose Route Frequency Provider Last Rate Last Dose  . azithromycin (ZITHROMAX) tablet 500 mg  500 mg Oral Daily Berkley Harvey, PHARMD   500 mg at 01/07/12 1150  . dextrose 5 %-0.45 % sodium chloride infusion   Intravenous Continuous Gwenyth Bender, MD 125 mL/hr at 01/07/12 1927    . diphenhydrAMINE (BENADRYL) capsule 25-50 mg  25-50 mg Oral Q4H PRN Gwenyth Bender, MD      . diphenhydrAMINE (BENADRYL) injection 25 mg  25 mg Intravenous Q4H PRN Gwenyth Bender, MD   25 mg at 01/03/12 2126  . enoxaparin (LOVENOX) injection 40 mg  40 mg Subcutaneous Q24H Gwenyth Bender, MD   40 mg at 01/06/12 2120  . HYDROmorphone (DILAUDID) injection 4 mg  4 mg Intravenous Q2H PRN Gwenyth Bender, MD   4 mg at 01/07/12 1950  . hydroxyurea (HYDREA) capsule 1,000 mg  1,000 mg Oral Custom Gwenyth Bender, MD   1,000 mg at 01/05/12 0914  . hydroxyurea (HYDREA) capsule 500 mg  500 mg Oral Custom Robynn Pane, MD   500 mg at 01/07/12 1150  . lidocaine (LIDODERM) 5 % 1 patch  1 patch Transdermal Q24H Gwenyth Bender, MD   1 patch at 01/06/12 2154  . mupirocin ointment (BACTROBAN) 2 %   Nasal BID Gwenyth Bender, MD      .  ondansetron [facility]) tablet 4 mg  4 mg Oral Q4H PRN Gwenyth Bender, MD   4 mg at 12/31/11 2225   Or  . ondansetron (ZOFRAN) injection 4 mg  4 mg Intravenous Q4H PRN Gwenyth Bender, MD   4 mg at 01/06/12 1232  . pantoprazole (PROTONIX) EC tablet 40 mg  40 mg Oral Q1200 Robynn Pane, MD   40 mg at 01/07/12 1150  . polyethylene glycol (MIRALAX / GLYCOLAX) packet 17 g  17 g Oral Daily Gwenyth Bender, MD   17 g at 01/07/12 1150  . potassium chloride SA (K-DUR,KLOR-CON) CR tablet 20 mEq  20 mEq Oral TID Ricki Rodriguez, MD   20 mEq at 01/06/12 2119  . promethazine (PHENERGAN) injection 12.5-25 mg  12.5-25 mg Intravenous Q3H PRN Gwenyth Bender, MD   25 mg at 01/07/12 1342  . senna-docusate (Senokot-S) tablet 1 tablet  1 tablet Oral BID PRN Robynn Pane, MD   1 tablet at 12/31/11 1434  . sodium chloride 0.9 % injection 10-40 mL  10-40 mL Intracatheter PRN Gwenyth Bender, MD      . sodium chloride 0.9 % injection 10-40 mL  10-40 mL Intracatheter Q12H Gwenyth Bender, MD   10 mL at  01/07/12 1158  . traMADol (ULTRAM) tablet 50 mg  50 mg Oral Q6H PRN Robynn Pane, MD        Objective: Blood pressure 105/68, pulse 93, temperature 98.3 F (36.8 C), temperature source Oral, resp. rate 18, height 5\' 3"  (1.6 m), weight 148 lb 2.4 oz (67.2 kg), SpO2 93.00%.  Well-developed well-nourished black female in no acute distress. HEENT: No sinus tenderness. Mild scleral icterus. NECK: No enlarged thyroid. No posterior cervical nodes. LUNGS: Clear to auscultation. Minimal left CVA tenderness. CV: Normal S1, S2 without S3. ABD: No tenderness. MSK: Negative Homans. NEURO: Intact.  Lab results: Results for orders placed during the hospital encounter of 12/28/11 (from the past 48 hour(s))  CBC     Status: Abnormal   Collection Time   01/06/12  5:05 AM      Component Value Range Comment   WBC 7.1  4.0 - 10.5 (K/uL)    RBC 2.51 (*) 3.87 - 5.11 (MIL/uL)    Hemoglobin 8.4 (*) 12.0 - 15.0 (g/dL)    HCT 16.1 (*) 09.6 - 46.0 (%)      MCV 93.6  78.0 - 100.0 (fL)    MCH 33.5  26.0 - 34.0 (pg)    MCHC 35.7  30.0 - 36.0 (g/dL)    RDW 04.5 (*) 40.9 - 15.5 (%)    Platelets 202  150 - 400 (K/uL)   COMPREHENSIVE METABOLIC PANEL     Status: Abnormal   Collection Time   01/06/12  5:05 AM      Component Value Range Comment   Sodium 138  135 - 145 (mEq/L)    Potassium 3.9  3.5 - 5.1 (mEq/L)    Chloride 105  96 - 112 (mEq/L)    CO2 28  19 - 32 (mEq/L)    Glucose, Bld 108 (*) 70 - 99 (mg/dL)    BUN 6  6 - 23 (mg/dL)    Creatinine, Ser 8.11  0.50 - 1.10 (mg/dL)    Calcium 8.5  8.4 - 10.5 (mg/dL)    Total Protein 6.9  6.0 - 8.3 (g/dL)    Albumin 2.7 (*) 3.5 - 5.2 (g/dL)    AST 98 (*) 0 - 37 (U/L)    ALT 51 (*) 0 - 35 (U/L)    Alkaline Phosphatase 172 (*) 39 - 117 (U/L)    Total Bilirubin 5.9 (*) 0.3 - 1.2 (mg/dL)    GFR calc non Af Amer >90  >90 (mL/min)    GFR calc Af Amer >90  >90 (mL/min)   CBC     Status: Abnormal   Collection Time   01/07/12  5:55 AM      Component Value Range Comment   WBC 7.3  4.0 - 10.5 (K/uL)    RBC 2.41 (*) 3.87 - 5.11 (MIL/uL)    Hemoglobin 7.9 (*) 12.0 - 15.0 (g/dL) REPEATED TO VERIFY   HCT 22.3 (*) 36.0 - 46.0 (%)    MCV 92.5  78.0 - 100.0 (fL)    MCH 32.8  26.0 - 34.0 (pg)    MCHC 35.4  30.0 - 36.0 (g/dL)    RDW 91.4 (*) 78.2 - 15.5 (%)    Platelets 217  150 - 400 (K/uL)   COMPREHENSIVE METABOLIC PANEL     Status: Abnormal   Collection Time   01/07/12  5:55 AM      Component Value Range Comment   Sodium 137  135 - 145 (mEq/L)    Potassium 3.5  3.5 - 5.1 (mEq/L)    Chloride 103  96 - 112 (mEq/L)    CO2 28  19 - 32 (mEq/L)    Glucose, Bld 116 (*) 70 - 99 (mg/dL)    BUN 4 (*) 6 - 23 (mg/dL)    Creatinine, Ser 0.45  0.50 - 1.10 (mg/dL)    Calcium 8.5  8.4 - 10.5 (mg/dL)    Total Protein 6.7  6.0 - 8.3 (g/dL)    Albumin 2.6 (*) 3.5 - 5.2 (g/dL)    AST 93 (*) 0 - 37 (U/L)    ALT 48 (*) 0 - 35 (U/L)    Alkaline Phosphatase 160 (*) 39 - 117 (U/L)    Total Bilirubin 5.3 (*) 0.3 - 1.2  (mg/dL)    GFR calc non Af Amer >90  >90 (mL/min)    GFR calc Af Amer >90  >90 (mL/min)     Studies/Results: No results found.  Patient Active Problem List  Diagnoses  . Sickle cell crisis  . Sickle cell disease  . Elevated LFTs  . Sickle cell anemia  . Fever  . Leukocytosis  . HTN (hypertension)    Impression: Slowly resolving sickle cell crisis. Status post recent upper respiratory infection. Active hemolysis continues but is improving.   Plan: Exchange transfusion in a.m. Discharge home with continued supportive care.   August Saucer, Allen Egerton 01/07/2012 8:15 PM

## 2012-01-08 LAB — COMPREHENSIVE METABOLIC PANEL
CO2: 28 mEq/L (ref 19–32)
Calcium: 8.5 mg/dL (ref 8.4–10.5)
Creatinine, Ser: 0.51 mg/dL (ref 0.50–1.10)
GFR calc Af Amer: >90 mL/min (ref >90)
GFR calc non Af Amer: >90 mL/min (ref >90)
Glucose, Bld: 104 mg/dL — ABNORMAL HIGH (ref 70–99)

## 2012-01-08 LAB — CBC
Hemoglobin: 7.7 g/dL — ABNORMAL LOW (ref 12.0–15.0)
MCH: 33.6 pg (ref 26.0–34.0)
MCV: 94.3 fL (ref 78.0–100.0)
RBC: 2.29 MIL/uL — ABNORMAL LOW (ref 3.87–5.11)

## 2012-01-08 NOTE — Progress Notes (Signed)
Subjective:  Patient feeling much better today. She rates her pain as a 4/10. There's no chest pains or shortness of breath. She will receive an exchange transfusion today and be discharged.   Allergies  Allergen Reactions  . Latex Other (See Comments)    REACTION: Pt experiences a burning sensation on contacted skin areas  . Lisinopril Other (See Comments) and Cough    REACTION: Sore/scratchy throat  . Tape Other (See Comments)    REACTION: Pt. Experiences a burning sensation on contacted skin areas  . Tylenol (Acetaminophen) Other (See Comments)    Pt. Does not take the following due to protection of kidney health. Past occurrence of Protein in urine.   Current Facility-Administered Medications  Medication Dose Route Frequency Provider Last Rate Last Dose  . azithromycin (ZITHROMAX) tablet 500 mg  500 mg Oral Daily Berkley Harvey, PHARMD   500 mg at 01/07/12 1150  . dextrose 5 %-0.45 % sodium chloride infusion   Intravenous Continuous Gwenyth Bender, MD 125 mL/hr at 01/08/12 1015    . diphenhydrAMINE (BENADRYL) capsule 25-50 mg  25-50 mg Oral Q4H PRN Gwenyth Bender, MD      . diphenhydrAMINE (BENADRYL) injection 25 mg  25 mg Intravenous Q4H PRN Gwenyth Bender, MD   25 mg at 01/08/12 1017  . enoxaparin (LOVENOX) injection 40 mg  40 mg Subcutaneous Q24H Gwenyth Bender, MD   40 mg at 01/07/12 2226  . HYDROmorphone (DILAUDID) injection 4 mg  4 mg Intravenous Q2H PRN Gwenyth Bender, MD   4 mg at 01/08/12 0908  . hydroxyurea (HYDREA) capsule 1,000 mg  1,000 mg Oral Custom Gwenyth Bender, MD   1,000 mg at 01/08/12 1015  . hydroxyurea (HYDREA) capsule 500 mg  500 mg Oral Custom Robynn Pane, MD   500 mg at 01/07/12 1150  . lidocaine (LIDODERM) 5 % 1 patch  1 patch Transdermal Q24H Gwenyth Bender, MD   1 patch at 01/07/12 2226  . mupirocin ointment (BACTROBAN) 2 %   Nasal BID Gwenyth Bender, MD      . ondansetron [facility]) tablet 4 mg  4 mg Oral Q4H PRN Gwenyth Bender, MD   4 mg at 12/31/11 2225   Or  .  ondansetron (ZOFRAN) injection 4 mg  4 mg Intravenous Q4H PRN Gwenyth Bender, MD   4 mg at 01/06/12 1232  . pantoprazole (PROTONIX) EC tablet 40 mg  40 mg Oral Q1200 Robynn Pane, MD   40 mg at 01/07/12 1150  . polyethylene glycol (MIRALAX / GLYCOLAX) packet 17 g  17 g Oral Daily Gwenyth Bender, MD   17 g at 01/08/12 1014  . promethazine (PHENERGAN) injection 12.5-25 mg  12.5-25 mg Intravenous Q3H PRN Gwenyth Bender, MD   25 mg at 01/08/12 0909  . senna-docusate (Senokot-S) tablet 1 tablet  1 tablet Oral BID PRN Robynn Pane, MD   1 tablet at 01/07/12 2226  . sodium chloride 0.9 % injection 10-40 mL  10-40 mL Intracatheter PRN Gwenyth Bender, MD      . sodium chloride 0.9 % injection 10-40 mL  10-40 mL Intracatheter Q12H Gwenyth Bender, MD   10 mL at 01/07/12 2225  . traMADol (ULTRAM) tablet 50 mg  50 mg Oral Q6H PRN Robynn Pane, MD        Objective: Blood pressure 108/68, pulse 93, temperature 99.1 F (37.3 C), temperature source Oral, resp. rate 17, height  5\' 3"  (1.6 m), weight 149 lb (67.586 kg), SpO2 95.00%.  Well-developed well-nourished black female in no acute distress. HEENT: No sclera to rest. No sinus tenderness. NECK: No posterior cervical nodes. LUNGS: Clear to auscultation. No CVA tenderness. CV: Normal S1, S2 without S3. ABD: No tenderness. MSK: Negative Homans. No edema. NEURO: Intact.  Lab results: Results for orders placed during the hospital encounter of 12/28/11 (from the past 48 hour(s))  CBC     Status: Abnormal   Collection Time   01/07/12  5:55 AM      Component Value Range Comment   WBC 7.3  4.0 - 10.5 (K/uL)    RBC 2.41 (*) 3.87 - 5.11 (MIL/uL)    Hemoglobin 7.9 (*) 12.0 - 15.0 (g/dL) REPEATED TO VERIFY   HCT 22.3 (*) 36.0 - 46.0 (%)    MCV 92.5  78.0 - 100.0 (fL)    MCH 32.8  26.0 - 34.0 (pg)    MCHC 35.4  30.0 - 36.0 (g/dL)    RDW 16.1 (*) 09.6 - 15.5 (%)    Platelets 217  150 - 400 (K/uL)   COMPREHENSIVE METABOLIC PANEL     Status: Abnormal   Collection  Time   01/07/12  5:55 AM      Component Value Range Comment   Sodium 137  135 - 145 (mEq/L)    Potassium 3.5  3.5 - 5.1 (mEq/L)    Chloride 103  96 - 112 (mEq/L)    CO2 28  19 - 32 (mEq/L)    Glucose, Bld 116 (*) 70 - 99 (mg/dL)    BUN 4 (*) 6 - 23 (mg/dL)    Creatinine, Ser 0.45  0.50 - 1.10 (mg/dL)    Calcium 8.5  8.4 - 10.5 (mg/dL)    Total Protein 6.7  6.0 - 8.3 (g/dL)    Albumin 2.6 (*) 3.5 - 5.2 (g/dL)    AST 93 (*) 0 - 37 (U/L)    ALT 48 (*) 0 - 35 (U/L)    Alkaline Phosphatase 160 (*) 39 - 117 (U/L)    Total Bilirubin 5.3 (*) 0.3 - 1.2 (mg/dL)    GFR calc non Af Amer >90  >90 (mL/min)    GFR calc Af Amer >90  >90 (mL/min)   CBC     Status: Abnormal   Collection Time   01/08/12  5:45 AM      Component Value Range Comment   WBC 7.1  4.0 - 10.5 (K/uL)    RBC 2.29 (*) 3.87 - 5.11 (MIL/uL)    Hemoglobin 7.7 (*) 12.0 - 15.0 (g/dL)    HCT 40.9 (*) 81.1 - 46.0 (%)    MCV 94.3  78.0 - 100.0 (fL)    MCH 33.6  26.0 - 34.0 (pg)    MCHC 35.6  30.0 - 36.0 (g/dL)    RDW 91.4 (*) 78.2 - 15.5 (%)    Platelets 234  150 - 400 (K/uL)   COMPREHENSIVE METABOLIC PANEL     Status: Abnormal   Collection Time   01/08/12  5:45 AM      Component Value Range Comment   Sodium 138  135 - 145 (mEq/L)    Potassium 3.7  3.5 - 5.1 (mEq/L)    Chloride 104  96 - 112 (mEq/L)    CO2 28  19 - 32 (mEq/L)    Glucose, Bld 104 (*) 70 - 99 (mg/dL)    BUN 4 (*) 6 - 23 (mg/dL)  Creatinine, Ser 0.51  0.50 - 1.10 (mg/dL)    Calcium 8.5  8.4 - 10.5 (mg/dL)    Total Protein 6.7  6.0 - 8.3 (g/dL)    Albumin 2.7 (*) 3.5 - 5.2 (g/dL)    AST 85 (*) 0 - 37 (U/L)    ALT 43 (*) 0 - 35 (U/L)    Alkaline Phosphatase 153 (*) 39 - 117 (U/L)    Total Bilirubin 5.3 (*) 0.3 - 1.2 (mg/dL)    GFR calc non Af Amer >90  >90 (mL/min)    GFR calc Af Amer >90  >90 (mL/min)     Studies/Results: No results found.  Patient Active Problem List  Diagnoses  . Sickle cell crisis  . Sickle cell disease  . Elevated LFTs  .  Sickle cell anemia  . Fever  . Leukocytosis  . HTN (hypertension)    Impression: Resolving sickle cell crisis. Active hemolysis gradually decreasing. Patient still awaiting exchange transfusion.   Plan: Exchange transfusion today or in a.m.Marland Kitchen Discharge home after transfusion.. Followup in  clinic in 1 week's time.   August Saucer, Aviannah Castoro 01/08/2012 10:18 AM

## 2012-01-09 LAB — COMPREHENSIVE METABOLIC PANEL
ALT: 39 U/L — ABNORMAL HIGH (ref 0–35)
AST: 81 U/L — ABNORMAL HIGH (ref 0–37)
Albumin: 2.6 g/dL — ABNORMAL LOW (ref 3.5–5.2)
CO2: 29 mEq/L (ref 19–32)
Calcium: 8.5 mg/dL (ref 8.4–10.5)
GFR calc non Af Amer: >90 mL/min (ref >90)
Sodium: 137 mEq/L (ref 135–145)

## 2012-01-09 LAB — TYPE AND SCREEN: Antibody Screen: POSITIVE

## 2012-01-09 LAB — CBC
MCH: 32.8 pg (ref 26.0–34.0)
Platelets: 167 10*3/uL (ref 150–400)
RBC: 2.59 MIL/uL — ABNORMAL LOW (ref 3.87–5.11)
WBC: 7.2 10*3/uL (ref 4.0–10.5)

## 2012-01-10 DIAGNOSIS — G8929 Other chronic pain: Secondary | ICD-10-CM | POA: Diagnosis not present

## 2012-01-10 DIAGNOSIS — D57819 Other sickle-cell disorders with crisis, unspecified: Secondary | ICD-10-CM | POA: Diagnosis not present

## 2012-02-13 NOTE — Discharge Summary (Signed)
Physician Discharge Summary  Patient ID: Dawn [name] MRN: [id number] DOB/AGE: [birth year] 31 y.o.  Admit date: 12/28/2011 Discharge date: 01/09/2012   Discharge Diagnoses:   Acute sickle cell crisis. Acute upper respiratory infection improved Hypertension history Discharged Condition: Improved  Operations/Procedues: Exchange transfusions x3   Hospital Course: See admission H&P for details. The patient was admitted for further treatment of her sickle cell crisis. She was initially treated in the sickle cell day unit with relapsing pain. She subsequently admitted to the regular floor and placed on IV fluids. She was continued on IV Dilaudid as well. Because of recent upper respiratory infection she was placed on Zithromax at 500 mg IV daily. She was also placed on incentive spirometry. Despite this patient did have significant pain with signs of active hemolysis. She subsequently underwent an exchange transfusions for a total of 3 times. This did subsequently help relieve her severe pains. She made steady progress thereafter. She was subsequently discharged home improved.   Significant Diagnostic Studies: None   Disposition: 01-Home or Self Care    Discharge medications: Cetirizine 10 mg daily, folic acid 1 mg daily, hydroxyurea 500 mg twice a day, oxycodone IR 5 mg every 4 hours when necessary, Senokot-S one tablet daily at bedtime.    Discharge Orders    Future Orders Please Complete By Expires   Diet - low sodium heart healthy      Increase activity slowly      No wound care      Call MD for:  severe uncontrolled pain      Discharge instructions      Comments:   Follow up with primary care physician in two days.        SignedAugust Saucer, Jameison Haji 02/13/2012, 3:58 PM

## 2012-03-06 NOTE — Discharge Summary (Signed)
[facility] Discharge Summary  Patient ID: Dawn [name] MRN: [id number] DOB/AGE: July 28, 1981 31 y.o.  Admit date: 12/26/2011 Discharge date: 12/26/2011  Admission Diagnoses:sickle cell crisis  Discharge Diagnoses:  Sickle cell crisis improved  Discharged Condition: improved  Clinic Course: patient was treated with IV fluids  Of half-normal saline. She was given IV Dilaudid q.2 hours p.r.n. Severe pain. Over the subsequent course of the treatment she improved considerably. Her pain was manageable at the time of discharge. Patient did require IV Zofran for nausea. At the time of discharge she rated her pain as a 4/10.  Disposition: 01-Home or Self Care  Discharge Orders    Future Orders Please Complete By Expires   Diet - low sodium heart healthy      Increase activity slowly      No wound care      Call MD for:  severe uncontrolled pain      Discharge instructions      Comments:   Follow up with primary care physician in two days.     MEDICATIONS: Oxycodone 5 mg, 2 capsules q.4 hours p.r.n. Hydrea 500 mg alternating with 1000 mg every other day Folic acid 1 mg daily Cetirizine 10 mg daily Senokot S. One tablet by mouth q.h.s.   SignedAugust Saucer, Dawn [name] 12/26/2011, 7:20 PM

## 2012-03-06 NOTE — Discharge Summary (Signed)
[facility] Discharge Summary  Patient ID: Dawn [name] MRN: [id number] DOB/AGE: [birth year] 31 y.o.  Admit date: 12/27/2011 Discharge date: 12/27/2011  Admission Diagnoses:  Discharge Diagnoses:  sickle cell crisis  Discharged Condition: improved  Clinic Course: patient was treated in the sickle cell unit with IV fluids. This consists of half normal saline at 125 cc an hour. She was also given Dilaudid 2-4 mg IV q.2 hours p.r.n. Pain. Patient was given incentive to [name] her. She needed IV Zofran and Benadryl for associated symptoms. At the end of her stay in the unit she felt better though not resolved. She subsequent discharge home with further therapy. She was advised that if she did not improve she returned to the unit and or be hospitalized.   Significant Diagnostic Studies: CBC, CMET as noted.   Discharge vital signs: Blood pressure 123/62, pulse 102, temperature 98.7 F (37.1 C), temperature source Oral, resp. rate 18, height 5\' 3"  (1.6 m), weight 140 lb (63.504 kg), SpO2 95.00%.   Disposition: 01-Home or Self Care  Discharge Orders    Future Orders Please Complete By Expires   Diet - low sodium heart healthy      Increase activity slowly      No wound care      Call MD for:  severe uncontrolled pain      Discharge instructions      Comments:   Follow up with primary care physician in 4 days.     MEDICATIONS: Cetirizine 10 mg daily Folic acid 1 mg daily Hydroxyurea 500 mg alternating with 1000 mg every other day Oxycodone 5 mg 2 capsules q.4 hours as needed for pain Senokot S. One q.h.s.   SignedAugust Saucer, Riva Sesma 12/27/2011 ,7:30 PM

## 2012-03-24 ENCOUNTER — Encounter (HOSPITAL_COMMUNITY): Payer: Self-pay | Admitting: Hematology

## 2012-03-24 ENCOUNTER — Inpatient Hospital Stay (HOSPITAL_COMMUNITY)
Admission: AD | Admit: 2012-03-24 | Discharge: 2012-04-01 | DRG: 812 | Disposition: A | Discharge status: Home / Self Care | Payer: Medicare Other | Attending: Internal Medicine | Admitting: Internal Medicine

## 2012-03-24 DIAGNOSIS — E8809 Other disorders of plasma-protein metabolism, not elsewhere classified: Secondary | ICD-10-CM

## 2012-03-24 DIAGNOSIS — I10 Essential (primary) hypertension: Secondary | ICD-10-CM

## 2012-03-24 DIAGNOSIS — B370 Candidal stomatitis: Secondary | ICD-10-CM

## 2012-03-24 DIAGNOSIS — D72829 Elevated white blood cell count, unspecified: Secondary | ICD-10-CM

## 2012-03-24 DIAGNOSIS — F4321 Adjustment disorder with depressed mood: Secondary | ICD-10-CM | POA: Diagnosis present

## 2012-03-24 DIAGNOSIS — E876 Hypokalemia: Secondary | ICD-10-CM

## 2012-03-24 DIAGNOSIS — F329 Major depressive disorder, single episode, unspecified: Secondary | ICD-10-CM

## 2012-03-24 DIAGNOSIS — D571 Sickle-cell disease without crisis: Secondary | ICD-10-CM

## 2012-03-24 DIAGNOSIS — D5700 Hb-SS disease with crisis, unspecified: Principal | ICD-10-CM | POA: Diagnosis present

## 2012-03-24 DIAGNOSIS — R509 Fever, unspecified: Secondary | ICD-10-CM

## 2012-03-24 DIAGNOSIS — J40 Bronchitis, not specified as acute or chronic: Secondary | ICD-10-CM

## 2012-03-24 HISTORY — DX: Major depressive disorder, single episode, unspecified: F32.9

## 2012-03-24 LAB — CBC
Hemoglobin: 9.3 g/dL — ABNORMAL LOW (ref 12.0–15.0)
RBC: 2.48 MIL/uL — ABNORMAL LOW (ref 3.87–5.11)

## 2012-03-24 LAB — COMPREHENSIVE METABOLIC PANEL
Albumin: 3.7 g/dL (ref 3.5–5.2)
BUN: 4 mg/dL — ABNORMAL LOW (ref 6–23)
Creatinine, Ser: 0.58 mg/dL (ref 0.50–1.10)
Total Bilirubin: 6.1 mg/dL — ABNORMAL HIGH (ref 0.3–1.2)
Total Protein: 7.9 g/dL (ref 6.0–8.3)

## 2012-03-24 LAB — RETICULOCYTES
RBC.: 2.5 MIL/uL — ABNORMAL LOW (ref 3.87–5.11)
Retic Ct Pct: 25.9 % — ABNORMAL HIGH (ref 0.4–3.1)

## 2012-03-24 LAB — MAGNESIUM: Magnesium: 1.8 mg/dL (ref 1.5–2.5)

## 2012-03-24 LAB — FERRITIN: Ferritin: 1266 ng/mL — ABNORMAL HIGH (ref 10–291)

## 2012-03-24 MED ORDER — ONDANSETRON HCL 4 MG PO TABS: 4.0000 mg | ORAL_TABLET | ORAL | Status: DC | PRN

## 2012-03-24 MED ORDER — HYDROMORPHONE HCL PF 2 MG/ML IJ SOLN
2.0000 mg | INTRAMUSCULAR | Status: DC | PRN
  Administered 2012-03-24 (×6): 4 mg via INTRAVENOUS
  Administered 2012-03-25: 2 mg via INTRAVENOUS
  Administered 2012-03-25 (×9): 4 mg via INTRAVENOUS
  Filled 2012-03-24 (×12): qty 2
  Filled 2012-03-24: qty 1
  Filled 2012-03-24 (×3): qty 2

## 2012-03-24 MED ORDER — DEXTROSE 5 % IV SOLN: 2000.0000 mg | Freq: Every day | INTRAVENOUS | Status: DC

## 2012-03-24 MED ORDER — DEXTROSE-NACL 5-0.45 % IV SOLN
INTRAVENOUS | Status: DC
  Administered 2012-03-24 – 2012-04-01 (×16): via INTRAVENOUS

## 2012-03-24 MED ORDER — ONDANSETRON HCL 4 MG/2ML IJ SOLN
4.0000 mg | INTRAMUSCULAR | Status: DC | PRN
  Administered 2012-03-24 – 2012-03-29 (×25): 4 mg via INTRAVENOUS
  Filled 2012-03-24 (×25): qty 2

## 2012-03-24 MED ORDER — DIPHENHYDRAMINE HCL 25 MG PO CAPS: 25.0000 mg | ORAL_CAPSULE | ORAL | Status: DC | PRN

## 2012-03-24 MED ORDER — DEXTROSE 5 % IV SOLN
2000.0000 mg | Freq: Every day | INTRAVENOUS | Status: DC
  Administered 2012-03-25: 2000 mg via INTRAVENOUS
  Filled 2012-03-24 (×2): qty 2

## 2012-03-24 MED ORDER — DIPHENHYDRAMINE HCL 50 MG/ML IJ SOLN
12.5000 mg | INTRAMUSCULAR | Status: DC | PRN
  Administered 2012-03-24 – 2012-03-25 (×9): 25 mg via INTRAVENOUS
  Administered 2012-03-26 (×2): 12.5 mg via INTRAVENOUS
  Administered 2012-03-26: 25 mg via INTRAVENOUS
  Administered 2012-03-26: 12.5 mg via INTRAVENOUS
  Administered 2012-03-26 – 2012-04-01 (×13): 25 mg via INTRAVENOUS
  Filled 2012-03-24 (×29): qty 1

## 2012-03-24 MED ORDER — FOLIC ACID 1 MG PO TABS
1.0000 mg | ORAL_TABLET | Freq: Every day | ORAL | Status: DC
  Administered 2012-03-25 – 2012-04-01 (×8): 1 mg via ORAL
  Filled 2012-03-24 (×8): qty 1

## 2012-03-24 NOTE — H&P (Signed)
[facility] History and Physical   Date: 03/24/2012  Patient name: Dawn [name] Medical record number: [id number] Date of birth: 05-Dec-1980 Age: 31 y.o. Gender: female PCP: Dawn Saucer ERIC, MD  Attending physician: Dawn Bender, MD  Chief Complaint: Patient has had diffuse pain for several weeks but became progressively worse in the last 2 days.  History of Present Illness: Dawn [name] called the [facility] on yesterday asking could she come in it was 1630 explained we were not open after 7 and if needed to proceed to ED if not to come in tomorrow. She presented to the clinic in mid back pain states she had been trying to manage pain at home but became unbearable. Also note stressor at home and being in the sun all day with her son at [employer] maybe an aggravating factor.   Meds: Prescriptions prior to admission  Medication Sig Dispense Refill  . cetirizine (ZYRTEC) 10 MG tablet Take 10 mg by mouth daily as needed. For allergies       . folic acid (FOLVITE) 1 MG tablet Take 1 mg by mouth daily.        . hydroxyurea (HYDREA) 500 MG capsule Take 500-1,000 mg by mouth as directed. Take 500mg  (1 tablet) po on all ODD days (Mondays, Wednesdays, Fridays, Sundays) and take 1000mg  (2 Tablets) on all EVEN days (Tuesdays, Thursdays, Saturdays).  May take with food to minimize GI side effects.      Marland Kitchen oxycodone (OXY-IR) 5 MG capsule Take 2 capsules (10 mg total) by mouth every 4 (four) hours as needed. For pain  60 capsule  0  . senna-docusate (SENOKOT-S) 8.6-50 MG per tablet Take 1 tablet by mouth at bedtime.  30 tablet  0    Allergies: Latex; Lisinopril; Tape; and Tylenol Past Medical History  Diagnosis Date  . Sickle cell disease   . Sickle cell disease, type S   . Blood transfusion    Past Surgical History  Procedure Date  . Cholecystectomy   .  left knee acl reconstruction   . Cesarean section     x 2  . Portacath placement    History reviewed. No pertinent family  history. History   Social History  . Marital Status: Single    Spouse Name: N/A    Number of Children: N/A  . Years of Education: N/A   Occupational History  . Not on file.   Social History Main Topics  . Smoking status: Never Smoker   . Smokeless tobacco: Not on file  . Alcohol Use: No  . Drug Use: No  . Sexually Active: Not Currently    Birth Control/ Protection: IUD   Other Topics Concern  . Not on file   Social History Narrative  . No narrative on file    Review of Systems: Negative except for diffuse pain   Physical Exam: Blood pressure 128/77, pulse 82, temperature 99 F (37.2 C), temperature source Oral, resp. rate 18, SpO2 96.00%. BP 136/68  Pulse 98  Temp 98.7 F (37.1 C) (Oral)  Resp 18  SpO2 84%  General Appearance:    Alert, cooperative, no distress, appears stated age  Head:    Normocephalic, without obvious abnormality, atraumatic  Eyes:    PERRL, moderate scleral icterus, EOM's intact, fundi    benign, both eyes  Ears:    Normal TM's and external ear canals, both ears  Nose:   Nares normal, septum midline, mucosa normal, no drainage  or sinus tenderness  Throat:   Lips, mucosa, and tongue normal; teeth and gums normal  Neck:   Supple, symmetrical, trachea midline, no adenopathy;    thyroid:  no enlargement/tenderness/nodules; no carotid   bruit or JVD  Back:     Symmetric, no curvature, ROM normal,mid back tenderness  Lungs:     Clear to auscultation bilaterally, respirations unlabored  Chest Wall:    No tenderness or deformity   Heart:    Regular rate and rhythm, S1 and S2 normal, no murmur, rub   or gallop     Abdomen:     Soft, non-tender, bowel sounds active all four quadrants,    no masses, no organomegaly  Genitalia:  Deferred  Rectal:  Deferred  Extremities:   Extremities normal, atraumatic, no cyanosis or edema  Pulses:   2+ and symmetric all extremities  Skin:   Skin color, texture, turgor normal, no rashes or lesions  Lymph  nodes:   Cervical, supraclavicular, and axillary nodes normal  Neurologic:   CNII-XII intact, normal strength, sensation and reflexes    throughout    Lab results: Results for orders placed during the hospital encounter of 03/24/12 (from the past 24 hour(s))  CBC     Status: Abnormal   Collection Time   03/24/12 12:07 PM      Component Value Range   WBC 6.9  4.0 - 10.5 K/uL   RBC 2.48 (*) 3.87 - 5.11 MIL/uL   Hemoglobin 9.3 (*) 12.0 - 15.0 g/dL   HCT 16.1 (*) 09.6 - 04.5 %   MCV 102.0 (*) 78.0 - 100.0 fL   MCH 37.5 (*) 26.0 - 34.0 pg   MCHC 36.8 (*) 30.0 - 36.0 g/dL   RDW 40.9 (*) 81.1 - 91.4 %   Platelets 253  150 - 400 K/uL  RETICULOCYTES     Status: Abnormal   Collection Time   03/24/12 12:07 PM      Component Value Range   Retic Ct Pct 25.9 (*) 0.4 - 3.1 %   RBC. 2.50 (*) 3.87 - 5.11 MIL/uL   Retic Count, Manual 647.5 (*) 19.0 - 186.0 K/uL  MAGNESIUM     Status: Normal   Collection Time   03/24/12 12:07 PM      Component Value Range   Magnesium 1.8  1.5 - 2.5 mg/dL  PHOSPHORUS     Status: Normal   Collection Time   03/24/12 12:07 PM      Component Value Range   Phosphorus 3.0  2.3 - 4.6 mg/dL  COMPREHENSIVE METABOLIC PANEL     Status: Abnormal   Collection Time   03/24/12 12:07 PM      Component Value Range   Sodium 134 (*) 135 - 145 mEq/L   Potassium 3.7  3.5 - 5.1 mEq/L   Chloride 101  96 - 112 mEq/L   CO2 24  19 - 32 mEq/L   Glucose, Bld 86  70 - 99 mg/dL   BUN 4 (*) 6 - 23 mg/dL   Creatinine, Ser 7.82  0.50 - 1.10 mg/dL   Calcium 9.1  8.4 - 95.6 mg/dL   Total Protein 7.9  6.0 - 8.3 g/dL   Albumin 3.7  3.5 - 5.2 g/dL   AST 71 (*) 0 - 37 U/L   ALT 40 (*) 0 - 35 U/L   Alkaline Phosphatase 188 (*) 39 - 117 U/L   Total Bilirubin 6.1 (*) 0.3 - 1.2 mg/dL   GFR calc  non Af Amer >90  >90 mL/min   GFR calc Af Amer >90  >90 mL/min    Imaging results:  No results found.   Assessment & Plan: Patient Active Hospital Problem List: Sickle cell Crisis  IVF hydration,  pain management nausea/pruritis/bowel management, home medication hemoglobinopathy-pending    Active hemolysis- CMET daily  Hemochromatosis -desferal IV QHS   Anemia CBC daily  Depression discussed options     Dawn [name] 03/24/2012, 1:04 PM

## 2012-03-25 ENCOUNTER — Encounter (HOSPITAL_COMMUNITY): Payer: Self-pay

## 2012-03-25 DIAGNOSIS — J40 Bronchitis, not specified as acute or chronic: Secondary | ICD-10-CM | POA: Diagnosis present

## 2012-03-25 DIAGNOSIS — E876 Hypokalemia: Secondary | ICD-10-CM | POA: Diagnosis not present

## 2012-03-25 DIAGNOSIS — F329 Major depressive disorder, single episode, unspecified: Secondary | ICD-10-CM | POA: Diagnosis not present

## 2012-03-25 DIAGNOSIS — M549 Dorsalgia, unspecified: Secondary | ICD-10-CM | POA: Diagnosis not present

## 2012-03-25 DIAGNOSIS — E8809 Other disorders of plasma-protein metabolism, not elsewhere classified: Secondary | ICD-10-CM | POA: Diagnosis present

## 2012-03-25 DIAGNOSIS — D5700 Hb-SS disease with crisis, unspecified: Secondary | ICD-10-CM | POA: Diagnosis not present

## 2012-03-25 DIAGNOSIS — I10 Essential (primary) hypertension: Secondary | ICD-10-CM | POA: Diagnosis not present

## 2012-03-25 DIAGNOSIS — M79609 Pain in unspecified limb: Secondary | ICD-10-CM | POA: Diagnosis not present

## 2012-03-25 DIAGNOSIS — B370 Candidal stomatitis: Secondary | ICD-10-CM | POA: Diagnosis present

## 2012-03-25 DIAGNOSIS — D571 Sickle-cell disease without crisis: Secondary | ICD-10-CM | POA: Diagnosis not present

## 2012-03-25 DIAGNOSIS — F4321 Adjustment disorder with depressed mood: Secondary | ICD-10-CM | POA: Diagnosis present

## 2012-03-25 LAB — CBC WITH DIFFERENTIAL/PLATELET
Basophils Absolute: 0.1 10*3/uL (ref 0.0–0.1)
Eosinophils Absolute: 0.1 10*3/uL (ref 0.0–0.7)
HCT: 21.5 % — ABNORMAL LOW (ref 36.0–46.0)
Lymphocytes Relative: 32 % (ref 12–46)
MCH: 37.4 pg — ABNORMAL HIGH (ref 26.0–34.0)
MCHC: 37.2 g/dL — ABNORMAL HIGH (ref 30.0–36.0)
Monocytes Absolute: 1.1 10*3/uL — ABNORMAL HIGH (ref 0.1–1.0)
Neutro Abs: 4.8 10*3/uL (ref 1.7–7.7)
RDW: 24 % — ABNORMAL HIGH (ref 11.5–15.5)

## 2012-03-25 LAB — COMPREHENSIVE METABOLIC PANEL
AST: 71 U/L — ABNORMAL HIGH (ref 0–37)
BUN: 3 mg/dL — ABNORMAL LOW (ref 6–23)
CO2: 26 mEq/L (ref 19–32)
Calcium: 8.6 mg/dL (ref 8.4–10.5)
Creatinine, Ser: 0.66 mg/dL (ref 0.50–1.10)
GFR calc Af Amer: >90 mL/min (ref >90)
GFR calc non Af Amer: >90 mL/min (ref >90)
Glucose, Bld: 96 mg/dL (ref 70–99)
Total Bilirubin: 8.2 mg/dL — ABNORMAL HIGH (ref 0.3–1.2)

## 2012-03-25 LAB — PRO B NATRIURETIC PEPTIDE: Pro B Natriuretic peptide (BNP): 255.2 pg/mL — ABNORMAL HIGH (ref 0–125)

## 2012-03-25 MED ORDER — ALPRAZOLAM 0.25 MG PO TABS
0.2500 mg | ORAL_TABLET | Freq: Four times a day (QID) | ORAL | Status: DC | PRN
  Administered 2012-03-25 – 2012-03-31 (×6): 0.25 mg via ORAL
  Filled 2012-03-25 (×6): qty 1

## 2012-03-25 MED ORDER — ESCITALOPRAM OXALATE 5 MG PO TABS
5.0000 mg | ORAL_TABLET | Freq: Every day | ORAL | Status: DC
  Administered 2012-03-25 – 2012-04-01 (×6): 5 mg via ORAL
  Filled 2012-03-25 (×8): qty 1

## 2012-03-25 MED ORDER — ENOXAPARIN SODIUM 30 MG/0.3ML ~~LOC~~ SOLN
30.0000 mg | SUBCUTANEOUS | Status: DC
  Administered 2012-03-26 – 2012-03-28 (×3): 30 mg via SUBCUTANEOUS
  Filled 2012-03-25 (×3): qty 0.3

## 2012-03-25 MED ORDER — HYDROCORTISONE 1 % EX CREA
TOPICAL_CREAM | Freq: Three times a day (TID) | CUTANEOUS | Status: DC
  Administered 2012-03-25 – 2012-03-27 (×6): via TOPICAL
  Administered 2012-03-27: 1 via TOPICAL
  Administered 2012-03-27: 16:00:00 via TOPICAL
  Administered 2012-03-28: 1 via TOPICAL
  Administered 2012-03-28 – 2012-04-01 (×11): via TOPICAL
  Filled 2012-03-25: qty 28

## 2012-03-25 MED ORDER — IBUPROFEN 600 MG PO TABS
600.0000 mg | ORAL_TABLET | Freq: Once | ORAL | Status: AC
  Administered 2012-03-25: 600 mg via ORAL
  Filled 2012-03-25: qty 1

## 2012-03-25 MED ORDER — HYDROMORPHONE HCL PF 4 MG/ML IJ SOLN
2.0000 mg | INTRAMUSCULAR | Status: DC | PRN
  Administered 2012-03-26 (×4): 4 mg via INTRAVENOUS
  Filled 2012-03-25 (×4): qty 1

## 2012-03-25 NOTE — Progress Notes (Signed)
As patient was being transferred, she develop a rash, and flushing to facial area, benadryl given early per V.O from [place], NP.  Symptoms improved, Patient assessed by Dr. August Saucer, and cleared for discharge to floor

## 2012-03-25 NOTE — Progress Notes (Signed)
Patient ID: Dawn [name], female   DOB: 1981/02/07, 31 y.o.   MRN: [id number] Report called to [facility], spoke with Stark Klein, patient's belongings packed, and transported to floor in stable condition

## 2012-03-25 NOTE — Progress Notes (Signed)
Subjective:  Ms. Drew was initially seen in the [facility] I discussed with her the need to have an inpatient admission which she reluctantly agreed upon. She is very tearful and anxious her youngest child died around this time in 2008-02-29 5 days before he was a year old and this is a very stressful event and time for her. Probable cause of crisis occuring. Her pain is still located in her mid back rates 6/10.   Physical Exam: Blood pressure 113/62, pulse 94, temperature 98.7 F (37.1 C), temperature source Oral, resp. rate 18, SpO2 96.00%. Physical Examination General Appearance:    Alert, cooperative, no distress, appears stated age  Head:    Normocephalic, without obvious abnormality, atraumatic  Eyes:    PERRL, moderate scleral icterus, EOM's intact, fundi    benign, both eyes  Ears:    Normal TM's and external ear canals, both ears  Nose:   Nares normal, septum midline, mucosa normal, no drainage    or sinus tenderness  Throat:   Lips, mucosa, and tongue normal; teeth and gums normal  Neck:   Supple, symmetrical, trachea midline, no adenopathy;    thyroid:  no enlargement/tenderness/nodules; no carotid   bruit or JVD  Back:     Symmetric, no curvature, ROM normal,mid back tenderness  Lungs:     Clear to auscultation bilaterally, respirations unlabored  Chest Wall:    No tenderness or deformity   Heart:    Regular rate and rhythm, S1 and S2 normal, no murmur, rub   or gallop     Abdomen:     Soft, non-tender, bowel sounds active all four quadrants,    no masses, no organomegaly  Genitalia:  Deferred  Rectal:  Deferred  Extremities:   Extremities normal, atraumatic, no cyanosis or edema  Pulses:   2+ and symmetric all extremities  Skin:   Skin color, texture, turgor normal,face flush and left arm fine raised bumps   Lymph nodes:   Cervical, supraclavicular, and axillary nodes normal  Neurologic:   CNII-XII intact, normal strength, sensation and reflexes    throughout     Basic Metabolic Panel:  Basename 03/25/12 0847 03/24/12 28-Feb-1206  NA 134* 134*  K 3.5 3.7  CL 101 101  CO2 26 24  GLUCOSE 96 86  BUN 3* 4*  CREATININE 0.66 0.58  CALCIUM 8.6 9.1  MG -- 1.8  PHOS -- 3.0   Liver Function Tests:  [facility] 03/25/12 0847 03/24/12 1207  AST 71* 71*  ALT 44* 40*  ALKPHOS 174* 188*  BILITOT 8.2* 6.1*  PROT 7.3 7.9  ALBUMIN 3.4* 3.7     CBC:  Basename 03/25/12 0847 03/24/12 1207  WBC 8.9 6.9  NEUTROABS 4.8 --  HGB 8.0* 9.3*  HCT 21.5* 25.3*  MCV 100.5* 102.0*  PLT 198 253    No results found.    Medications:  Continuous:   . dextrose 5 % and 0.45% NaCl 125 mL/hr at 03/25/12 0743      . deferoxamine (DESFERAL) IV  2,000 mg Intravenous QHS  . escitalopram  5 mg Oral Daily  . folic acid  1 mg Oral Daily  . ibuprofen  600 mg Oral Once  . DISCONTD: deferoxamine (DESFERAL) IV  2,000 mg Intravenous QHS   Impression: Active Problems: Sickle Cell Crisis  Anxiety Depression Hemochromosis  Active Hemoloyosis   Plan: . The patient will continue with pain/nausea/pruritis/bowel management, home medications and DVT prophylaxis. CMP and CBC daily, Hemoglobinopathy is pending  Xanax .  25mg  prn anxiety  Start Lexapro 5mg  daily  IV Desferal QHS     LOS: 1 day   Grayce Sessions, NPC Pager: [phone] 03/25/2012, 11:10 AM

## 2012-03-25 NOTE — Social Work (Signed)
Clinical Social Work Department BRIEF PSYCHOSOCIAL ASSESSMENT 03/25/2012  Patient:  Dawn [name], Dawn [name]     Account Number:  [id number]     Admit date:  03/24/2012  Clinical Social Worker:  Eddie Candle  Date/Time:  03/25/2012 10:00 AM  Referred by:  Physician  Date Referred:  03/25/2012 Referred for  Psychosocial assessment   Other Referral:   Interview type:  Patient Other interview type:    PSYCHOSOCIAL DATA Living Status:  FAMILY Admitted from facility:   Level of care:   Primary support name:  parents Primary support relationship to patient:  PARENT Degree of support available:   great according to patient    CURRENT CONCERNS Current Concerns  Behavioral Health Issues   Other Concerns:   Patient is a bit emotional because today is the annversary of the death of her son.    SOCIAL WORK ASSESSMENT / PLAN CSW processed with patient challenges with emotions surrounding her sound and provided some direction for managing her grief.  Patient was open and receptive. Patient stated that she copes well with this transition but during the season of her son's death it becomes difficult for her.  Patient states that she prefers counseling as opposed to medication but is willing to take a mild antidepressant in order to manage her mood during this visit.   Assessment/plan status:  Psychosocial Support/Ongoing Assessment of Needs Other assessment/ plan:   Information/referral to community resources:   CSW will follow up with patient in inpatient/outpatient    PATIENT'S/FAMILY'S RESPONSE TO PLAN OF CARE: Patient agrees to care plan and desires follow up with CSW.

## 2012-03-26 LAB — CBC
Hemoglobin: 7.7 g/dL — ABNORMAL LOW (ref 12.0–15.0)
MCH: 37.9 pg — ABNORMAL HIGH (ref 26.0–34.0)
Platelets: 181 10*3/uL (ref 150–400)
RBC: 2.03 MIL/uL — ABNORMAL LOW (ref 3.87–5.11)
WBC: 9.1 10*3/uL (ref 4.0–10.5)

## 2012-03-26 LAB — COMPREHENSIVE METABOLIC PANEL
ALT: 41 U/L — ABNORMAL HIGH (ref 0–35)
AST: 77 U/L — ABNORMAL HIGH (ref 0–37)
Alkaline Phosphatase: 164 U/L — ABNORMAL HIGH (ref 39–117)
CO2: 26 mEq/L (ref 19–32)
Chloride: 102 mEq/L (ref 96–112)
GFR calc Af Amer: >90 mL/min (ref >90)
GFR calc non Af Amer: >90 mL/min (ref >90)
Glucose, Bld: 94 mg/dL (ref 70–99)
Potassium: 3.9 mEq/L (ref 3.5–5.1)
Sodium: 134 mEq/L — ABNORMAL LOW (ref 135–145)
Total Bilirubin: 8.2 mg/dL — ABNORMAL HIGH (ref 0.3–1.2)

## 2012-03-26 MED ORDER — IBUPROFEN 400 MG PO TABS
400.0000 mg | ORAL_TABLET | Freq: Four times a day (QID) | ORAL | Status: DC
  Administered 2012-03-26 – 2012-03-28 (×6): 400 mg via ORAL
  Filled 2012-03-26 (×9): qty 1

## 2012-03-26 MED ORDER — SUMATRIPTAN SUCCINATE 50 MG PO TABS
50.0000 mg | ORAL_TABLET | ORAL | Status: DC | PRN
  Administered 2012-03-26 – 2012-03-27 (×2): 50 mg via ORAL
  Filled 2012-03-26 (×3): qty 1

## 2012-03-26 MED ORDER — HYDROMORPHONE HCL PF 2 MG/ML IJ SOLN
2.0000 mg | INTRAMUSCULAR | Status: DC
  Administered 2012-03-26: 4 mg via INTRAVENOUS
  Administered 2012-03-26: 2 mg via INTRAVENOUS
  Administered 2012-03-26 – 2012-03-29 (×29): 4 mg via INTRAVENOUS
  Administered 2012-03-29: 2 mg via INTRAVENOUS
  Administered 2012-03-29 – 2012-03-30 (×20): 4 mg via INTRAVENOUS
  Administered 2012-03-31 (×2): 2 mg via INTRAVENOUS
  Administered 2012-03-31: 4 mg via INTRAVENOUS
  Administered 2012-03-31: 2 mg via INTRAVENOUS
  Administered 2012-03-31 (×4): 4 mg via INTRAVENOUS
  Administered 2012-03-31: 2 mg via INTRAVENOUS
  Administered 2012-03-31: 4 mg via INTRAVENOUS
  Administered 2012-03-31 – 2012-04-01 (×3): 2 mg via INTRAVENOUS
  Administered 2012-04-01 (×2): 4 mg via INTRAVENOUS
  Administered 2012-04-01 (×4): 2 mg via INTRAVENOUS
  Administered 2012-04-01: 4 mg via INTRAVENOUS
  Administered 2012-04-01 (×2): 2 mg via INTRAVENOUS
  Administered 2012-04-01: 4 mg via INTRAVENOUS
  Administered 2012-04-01: 2 mg via INTRAVENOUS
  Filled 2012-03-26 (×2): qty 1
  Filled 2012-03-26: qty 2
  Filled 2012-03-26 (×2): qty 1
  Filled 2012-03-26 (×3): qty 2
  Filled 2012-03-26: qty 1
  Filled 2012-03-26 (×9): qty 2
  Filled 2012-03-26: qty 1
  Filled 2012-03-26 (×4): qty 2
  Filled 2012-03-26: qty 1
  Filled 2012-03-26 (×10): qty 2
  Filled 2012-03-26: qty 1
  Filled 2012-03-26 (×15): qty 2
  Filled 2012-03-26: qty 1
  Filled 2012-03-26: qty 2
  Filled 2012-03-26 (×2): qty 1
  Filled 2012-03-26 (×6): qty 2
  Filled 2012-03-26 (×2): qty 1
  Filled 2012-03-26 (×2): qty 2
  Filled 2012-03-26 (×2): qty 1
  Filled 2012-03-26 (×5): qty 2
  Filled 2012-03-26: qty 1
  Filled 2012-03-26: qty 2
  Filled 2012-03-26 (×2): qty 1
  Filled 2012-03-26: qty 2
  Filled 2012-03-26: qty 1
  Filled 2012-03-26: qty 2

## 2012-03-26 MED ORDER — GUAIFENESIN 100 MG/5ML PO SOLN: 5.0000 mL | ORAL | Status: DC | PRN

## 2012-03-26 MED ORDER — SODIUM CHLORIDE 0.9 % IJ SOLN
10.0000 mL | Freq: Two times a day (BID) | INTRAMUSCULAR | Status: DC
  Administered 2012-03-26: 20 mL
  Administered 2012-03-27 – 2012-04-01 (×11): 10 mL

## 2012-03-26 MED ORDER — SODIUM CHLORIDE 0.9 % IJ SOLN
10.0000 mL | INTRAMUSCULAR | Status: DC | PRN
  Administered 2012-03-26: 70 mL
  Administered 2012-03-28: 40 mL
  Administered 2012-03-29: 20 mL
  Administered 2012-03-29: 40 mL

## 2012-03-26 NOTE — Progress Notes (Signed)
Subjective: Dawn [name] was seen on rounds today. She voiced feeling swollen, develop a cough and has pain lower back and legs. Also mentioned having a severe head ache and pain medication is not helping. Described as sharp pain and a hammer banging at her head points to frontal lobe to temple question tension head ache.      Physical Exam: Blood pressure 113/62, pulse 94, temperature 98.7 F (37.1 C), temperature source Oral, resp. rate 18, SpO2 96.00%. Physical Examination General Appearance:    Alert, cooperative, no distress, appears stated age  Head:    Normocephalic, without obvious abnormality, atraumatic  Eyes:    PERRL, moderate scleral icterus, EOM's intact, fundi    benign, both eyes  Ears:    Normal TM's and external ear canals, both ears  Nose:   Nares normal, septum midline, mucosa normal, no drainage    or sinus tenderness  Throat:   Lips, mucosa, and tongue normal; teeth and gums normal  Neck:   Supple, symmetrical, trachea midline, no adenopathy;    thyroid:  no enlargement/tenderness/nodules; no carotid   bruit or JVD  Back:     Symmetric, no curvature, ROM normal,mid back tenderness  Lungs:     Clear to auscultation bilaterally, respirations unlabored  Chest Wall:    No tenderness or deformity   Heart:    Regular rate and rhythm, S1 and S2 normal, no murmur, rub   or gallop     Abdomen:     Soft, non-tender, bowel sounds active all four quadrants,    no masses, no organomegaly  Genitalia:  Deferred  Rectal:  Deferred  Extremities:   Extremities normal, atraumatic, no cyanosis or edema  Pulses:   2+ and symmetric all extremities  Skin:   Skin color, texture, turgor normal,face flush and left arm fine raised bumps   Lymph nodes:   Cervical, supraclavicular, and axillary nodes normal  Neurologic:   CNII-XII intact, normal strength, sensation and reflexes    throughout     Basic Metabolic Panel:  Basename 03/26/12 0730 03/25/12 0847 03/24/12 1207  NA 134* 134* --  K  3.9 3.5 --  CL 102 101 --  CO2 26 26 --  GLUCOSE 94 96 --  BUN 3* 3* --  CREATININE 0.70 0.66 --  CALCIUM 8.3* 8.6 --  MG -- -- 1.8  PHOS -- -- 3.0   Liver Function Tests:  [facility] 03/26/12 0730 03/25/12 0847  AST 77* 71*  ALT 41* 44*  ALKPHOS 164* 174*  BILITOT 8.2* 8.2*  PROT 6.7 7.3  ALBUMIN 2.9* 3.4*     CBC:  Basename 03/26/12 0730 03/25/12 0847  WBC 9.1 8.9  NEUTROABS -- 4.8  HGB 7.7* 8.0*  HCT 20.7* 21.5*  MCV 102.0* 100.5*  PLT 181 198    No results found.    Medications:  Continuous:    . dextrose 5 % and 0.45% NaCl 125 mL/hr at 03/26/12 1610      . enoxaparin (LOVENOX) injection  30 mg Subcutaneous Q24H  . escitalopram  5 mg Oral Daily  . folic acid  1 mg Oral Daily  . hydrocortisone cream   Topical TID  .  HYDROmorphone (DILAUDID) injection  2-4 mg Intravenous Q2H  . DISCONTD: deferoxamine (DESFERAL) IV  2,000 mg Intravenous QHS   Impression: Active Problems: Sickle Cell Crisis  Anxiety Depression Hemochromosis  Active Hemoloyosis   Plan: . The patient will continue with pain/nausea/pruritis/bowel management, home medications and DVT prophylaxis. CMP and CBC  daily, Hemoglobinopathy is pending  Xanax .25mg  prn anxiety  Lexapro 5mg  daily  IV Desferal QHS discontinue probably intolerance, Exchanged to be done after picc line obtained    LOS: 2 days   Grayce Sessions, NPC Pager: [phone] 03/26/2012, 1:23 PM

## 2012-03-26 NOTE — Progress Notes (Signed)
piccPeripherally Inserted Central Catheter/Midline Placement  The IV Nurse has discussed with the patient and/or persons authorized to consent for the patient, the purpose of this procedure and the potential benefits and risks involved with this procedure.  The benefits include less needle sticks, lab draws from the catheter and patient may be discharged home with the catheter.  Risks include, but not limited to, infection, bleeding, blood clot (thrombus formation), and puncture of an artery; nerve damage and irregular heat beat.  Alternatives to this procedure were also discussed.  PICC/Midline Placement Documentation        [name] 03/26/2012, 4:59 PM

## 2012-03-27 LAB — COMPREHENSIVE METABOLIC PANEL
ALT: 41 U/L — ABNORMAL HIGH (ref 0–35)
AST: 71 U/L — ABNORMAL HIGH (ref 0–37)
CO2: 28 mEq/L (ref 19–32)
Calcium: 8.2 mg/dL — ABNORMAL LOW (ref 8.4–10.5)
Chloride: 104 mEq/L (ref 96–112)
GFR calc Af Amer: >90 mL/min (ref >90)
GFR calc non Af Amer: >90 mL/min (ref >90)
Glucose, Bld: 105 mg/dL — ABNORMAL HIGH (ref 70–99)
Sodium: 138 mEq/L (ref 135–145)
Total Bilirubin: 7.6 mg/dL — ABNORMAL HIGH (ref 0.3–1.2)

## 2012-03-27 LAB — CBC
Hemoglobin: 8.2 g/dL — ABNORMAL LOW (ref 12.0–15.0)
MCH: 36.3 pg — ABNORMAL HIGH (ref 26.0–34.0)
MCV: 99.1 fL (ref 78.0–100.0)
Platelets: 197 10*3/uL (ref 150–400)
RBC: 2.26 MIL/uL — ABNORMAL LOW (ref 3.87–5.11)
WBC: 8.4 10*3/uL (ref 4.0–10.5)

## 2012-03-27 MED ORDER — POTASSIUM CHLORIDE CRYS ER 20 MEQ PO TBCR
20.0000 meq | EXTENDED_RELEASE_TABLET | Freq: Two times a day (BID) | ORAL | Status: DC
Start: 2012-03-28 — End: 2012-03-28
  Administered 2012-03-28: 20 meq via ORAL
  Filled 2012-03-27 (×2): qty 1

## 2012-03-27 MED ORDER — GUAIFENESIN 100 MG/5ML PO SOLN
5.0000 mL | Freq: Four times a day (QID) | ORAL | Status: DC
  Administered 2012-03-27: 100 mg via ORAL
  Filled 2012-03-27 (×7): qty 5

## 2012-03-27 MED ORDER — SENNA 8.6 MG PO TABS
2.0000 | ORAL_TABLET | Freq: Every day | ORAL | Status: DC
  Administered 2012-03-27: 17.2 mg via ORAL
  Filled 2012-03-27: qty 2

## 2012-03-27 MED ORDER — POTASSIUM CHLORIDE 10 MEQ/100ML IV SOLN
10.0000 meq | INTRAVENOUS | Status: AC
  Administered 2012-03-27 (×2): 10 meq via INTRAVENOUS
  Filled 2012-03-27 (×2): qty 100

## 2012-03-27 NOTE — Progress Notes (Signed)
Subjective: Dawn [name] was seen on rounds today. She had family visiting . Voiced concerns about unable to take desferal and reviewed labs results with her.   Physical Exam: Blood pressure 113/62, pulse 94, temperature 98.7 F (37.1 C), temperature source Oral, resp. rate 18, SpO2 96.00%. Physical Examination  General Appearance:    Alert, cooperative, no distress, appears stated age  Head:    Normocephalic, without obvious abnormality, atraumatic  Eyes:    PERRL, moderate scleral icterus, EOM's intact, fundi    benign, both eyes  Ears:    Normal TM's and external ear canals, both ears  Nose:   Nares normal, septum midline, mucosa normal, no drainage    or sinus tenderness  Throat:   Lips, mucosa, and tongue normal; teeth and gums normal  Neck:   Supple, symmetrical, trachea midline, no adenopathy;    thyroid:  no enlargement/tenderness/nodules; no carotid   bruit or JVD  Back:     Symmetric, no curvature, ROM normal,mid back tenderness  Lungs:     Clear to auscultation bilaterally, respirations unlabored  Chest Wall:    No tenderness or deformity   Heart:    Regular rate and rhythm, S1 and S2 normal, no murmur, rub   or gallop     Abdomen:     Soft, non-tender, bowel sounds active all four quadrants,    no masses, no organomegaly  Genitalia:  Deferred  Rectal:  Deferred  Extremities:   Extremities normal, atraumatic, no cyanosis or edema  Pulses:   2+ and symmetric all extremities  Skin:   Skin color, texture, turgor normal,face flush and left arm fine raised bumps   Lymph nodes:   Cervical, supraclavicular, and axillary nodes normal  Neurologic:   CNII-XII intact, normal strength, sensation and reflexes    throughout     Basic Metabolic Panel:  Basename 03/27/12 1030 03/26/12 0730  NA 138 134*  K 3.1* 3.9  CL 104 102  CO2 28 26  GLUCOSE 105* 94  BUN <3* 3*  CREATININE 0.70 0.70  CALCIUM 8.2* 8.3*  MG -- --  PHOS -- --   Liver Function Tests:  Basename 03/27/12 1030  03/26/12 0730  AST 71* 77*  ALT 41* 41*  ALKPHOS 152* 164*  BILITOT 7.6* 8.2*  PROT 6.3 6.7  ALBUMIN 2.8* 2.9*     CBC:  Basename 03/27/12 1030 03/26/12 0730 03/25/12 0847  WBC 8.4 9.1 --  NEUTROABS -- -- 4.8  HGB 8.2* 7.7* --  HCT 22.4* 20.7* --  MCV 99.1 102.0* --  PLT 197 181 --    No results found.    Medications:  Continuous:    . dextrose 5 % and 0.45% NaCl 125 mL/hr at 03/27/12 0642      . enoxaparin (LOVENOX) injection  30 mg Subcutaneous Q24H  . escitalopram  5 mg Oral Daily  . folic acid  1 mg Oral Daily  . hydrocortisone cream   Topical TID  .  HYDROmorphone (DILAUDID) injection  2-4 mg Intravenous Q2H  . ibuprofen  400 mg Oral QID  . potassium chloride  10 mEq Intravenous Q1 Hr x 2  . potassium chloride  20 mEq Oral BID  . sodium chloride  10-40 mL Intracatheter Q12H   Impression: Active Problems: Sickle Cell Crisis  Anxiety Depression Hemochromosis  Active Hemoloyosis  Hypokalemia  Plan: . The patient will continue with pain/nausea/pruritis/bowel management, home medications and DVT prophylaxis. CMP and CBC daily, Hemoglobinopathy is pending  Xanax .25mg  prn  anxiety  Lexapro 5mg  daily  2 Runs of K+ than po K+   LOS: 3 days   Grayce Sessions, NPC Pager: [phone] 03/27/2012, 12:24 PM

## 2012-03-28 ENCOUNTER — Encounter (HOSPITAL_COMMUNITY): Payer: Self-pay | Admitting: Family

## 2012-03-28 DIAGNOSIS — E876 Hypokalemia: Secondary | ICD-10-CM

## 2012-03-28 DIAGNOSIS — J40 Bronchitis, not specified as acute or chronic: Secondary | ICD-10-CM

## 2012-03-28 DIAGNOSIS — B370 Candidal stomatitis: Secondary | ICD-10-CM

## 2012-03-28 DIAGNOSIS — F329 Major depressive disorder, single episode, unspecified: Secondary | ICD-10-CM

## 2012-03-28 DIAGNOSIS — E8809 Other disorders of plasma-protein metabolism, not elsewhere classified: Secondary | ICD-10-CM

## 2012-03-28 HISTORY — DX: Major depressive disorder, single episode, unspecified: F32.9

## 2012-03-28 LAB — COMPREHENSIVE METABOLIC PANEL
ALT: 36 U/L — ABNORMAL HIGH (ref 0–35)
AST: 67 U/L — ABNORMAL HIGH (ref 0–37)
Albumin: 2.5 g/dL — ABNORMAL LOW (ref 3.5–5.2)
Alkaline Phosphatase: 143 U/L — ABNORMAL HIGH (ref 39–117)
Calcium: 8.2 mg/dL — ABNORMAL LOW (ref 8.4–10.5)
GFR calc Af Amer: >90 mL/min (ref >90)
Glucose, Bld: 94 mg/dL (ref 70–99)
Potassium: 3.5 mEq/L (ref 3.5–5.1)
Sodium: 140 mEq/L (ref 135–145)
Total Protein: 6.1 g/dL (ref 6.0–8.3)

## 2012-03-28 LAB — CBC
HCT: 20.4 % — ABNORMAL LOW (ref 36.0–46.0)
Hemoglobin: 7.3 g/dL — ABNORMAL LOW (ref 12.0–15.0)
MCH: 36 pg — ABNORMAL HIGH (ref 26.0–34.0)
MCHC: 35.8 g/dL (ref 30.0–36.0)
MCV: 100.5 fL — ABNORMAL HIGH (ref 78.0–100.0)
Platelets: 148 10*3/uL — ABNORMAL LOW (ref 150–400)
RBC: 2.03 MIL/uL — ABNORMAL LOW (ref 3.87–5.11)
RDW: 27.3 % — ABNORMAL HIGH (ref 11.5–15.5)
WBC: 6.5 10*3/uL (ref 4.0–10.5)

## 2012-03-28 LAB — HEMOGLOBINOPATHY EVALUATION
Hemoglobin Other: 0 %
Hgb A2 Quant: 3.2 % (ref 2.2–3.2)
Hgb A: 6.3 % — ABNORMAL LOW (ref 96.8–97.8)
Hgb F Quant: 10.4 % — ABNORMAL HIGH (ref 0.0–2.0)

## 2012-03-28 LAB — PREPARE RBC (CROSSMATCH)

## 2012-03-28 MED ORDER — GUAIFENESIN ER 600 MG PO TB12
600.0000 mg | ORAL_TABLET | Freq: Two times a day (BID) | ORAL | Status: DC
  Administered 2012-03-28 – 2012-04-01 (×9): 600 mg via ORAL
  Filled 2012-03-28 (×10): qty 1

## 2012-03-28 MED ORDER — BENZONATATE 100 MG PO CAPS
100.0000 mg | ORAL_CAPSULE | Freq: Three times a day (TID) | ORAL | Status: DC
  Administered 2012-03-28 – 2012-04-01 (×13): 100 mg via ORAL
  Filled 2012-03-28 (×15): qty 1

## 2012-03-28 MED ORDER — LEVALBUTEROL HCL 0.63 MG/3ML IN NEBU
0.6300 mg | INHALATION_SOLUTION | Freq: Two times a day (BID) | RESPIRATORY_TRACT | Status: DC
  Administered 2012-03-28 – 2012-03-31 (×6): 0.63 mg via RESPIRATORY_TRACT
  Filled 2012-03-28 (×8): qty 3

## 2012-03-28 MED ORDER — RISAQUAD PO CAPS
2.0000 | ORAL_CAPSULE | Freq: Every day | ORAL | Status: DC
  Administered 2012-03-28 – 2012-04-01 (×5): 2 via ORAL
  Filled 2012-03-28 (×5): qty 2

## 2012-03-28 MED ORDER — POTASSIUM CHLORIDE CRYS ER 20 MEQ PO TBCR
20.0000 meq | EXTENDED_RELEASE_TABLET | Freq: Every day | ORAL | Status: DC
  Administered 2012-03-29 – 2012-03-30 (×2): 20 meq via ORAL
  Filled 2012-03-28 (×2): qty 1

## 2012-03-28 MED ORDER — HYDROXYUREA 500 MG PO CAPS: 500.0000 mg | ORAL_CAPSULE | ORAL | Status: DC

## 2012-03-28 MED ORDER — FLUCONAZOLE 100 MG PO TABS
100.0000 mg | ORAL_TABLET | Freq: Every day | ORAL | Status: AC
  Administered 2012-03-28 – 2012-03-30 (×3): 100 mg via ORAL
  Filled 2012-03-28 (×3): qty 1

## 2012-03-28 MED ORDER — ALBUMIN HUMAN 25 % IV SOLN
50.0000 g | Freq: Once | INTRAVENOUS | Status: AC
  Administered 2012-03-28: 50 g via INTRAVENOUS
  Filled 2012-03-28: qty 200

## 2012-03-28 MED ORDER — AZITHROMYCIN 250 MG PO TABS
250.0000 mg | ORAL_TABLET | Freq: Every day | ORAL | Status: AC
  Administered 2012-03-29 – 2012-04-01 (×4): 250 mg via ORAL
  Filled 2012-03-28 (×4): qty 1

## 2012-03-28 MED ORDER — HYDROXYUREA 500 MG PO CAPS: 500.0000 mg | ORAL_CAPSULE | Freq: Two times a day (BID) | ORAL | Status: DC

## 2012-03-28 MED ORDER — HYDROXYUREA 500 MG PO CAPS
500.0000 mg | ORAL_CAPSULE | ORAL | Status: DC
  Administered 2012-03-28 – 2012-04-01 (×3): 500 mg via ORAL
  Filled 2012-03-28 (×3): qty 1

## 2012-03-28 MED ORDER — DIPHENHYDRAMINE HCL 50 MG/ML IJ SOLN
25.0000 mg | Freq: Once | INTRAMUSCULAR | Status: AC
  Administered 2012-03-28: 25 mg via INTRAVENOUS

## 2012-03-28 MED ORDER — ENOXAPARIN SODIUM 40 MG/0.4ML ~~LOC~~ SOLN
40.0000 mg | SUBCUTANEOUS | Status: DC
  Administered 2012-03-29 – 2012-04-01 (×4): 40 mg via SUBCUTANEOUS
  Filled 2012-03-28 (×4): qty 0.4

## 2012-03-28 MED ORDER — AZITHROMYCIN 500 MG PO TABS
500.0000 mg | ORAL_TABLET | Freq: Every day | ORAL | Status: AC
  Administered 2012-03-28: 500 mg via ORAL
  Filled 2012-03-28: qty 1

## 2012-03-28 MED ORDER — HYDROXYUREA 500 MG PO CAPS
1000.0000 mg | ORAL_CAPSULE | ORAL | Status: DC
  Administered 2012-03-29 – 2012-03-31 (×2): 1000 mg via ORAL
  Filled 2012-03-28 (×2): qty 2

## 2012-03-28 NOTE — Progress Notes (Signed)
Sickle Cell Medical Service Progress Note   Subjective: The patient was seen on rounds today.  The patient was sitting quietly on the side of her bed visiting with her nurse.  The patient is complaining of pain 6/10 in her lower back and bilateral LE's.  The patient is also complaining of coughing frequently and the sputum "gets stuck" in her chest.  The patient prefers to receive Mucinex instead of Robitussin.  The patient has not had a bowel movement, but states that Miralax works for her.  Explained and agreed upon the patient receiving blood exchange x 2 and an Albumin infusion.  The patient explained that she had an allergic reaction to the Desferal infusion.  Asked the patient to consume more nutrition.  No other nursing or patient concerns.  Objective: Vital signs in last 24 hours: Blood pressure 111/75, pulse 91, temperature 98.1 F (36.7 C), temperature source Oral, resp. rate 16, height 5\' 3"  (1.6 m), weight 159 lb 13.3 oz (72.5 kg), SpO2 98.00%.  General appearance: Alert, cooperative, well nourished, well developed, mild distress  Head: Normocephalic, without obvious abnormality, atraumatic  Eyes: PERRLA, EOMI, mild scleral icterus Nose: Nares, septum and mucosa are normal, frontal sinus tenderness  Throat: Lips, mucosa, and tongue are normal; teeth and gums are normal  Neck: No adenopathy, supple, symmetrical, trachea midline and thyroid not enlarged, symmetric, no tenderness/mass/nodules  Back: Symmetric, no curvature, impaired ROM, no CVA tenderness, diffuse lower/middle tenderness  Cardio: Regular rate and rhythm, S1, S2 normal, no murmur/click/rub/gallop GI: Soft, non-tender, distended, hypoactive bowel sounds, no organomegaly  Extremities: Extremities normal, atraumatic, no cyanosis or edema, tender bilateral LEs  Pulses: 2+ and symmetric  Skin: Skin color, texture, turgor normal, no rashes or lesions, tattos, well healed scars on trunk and extremities  Neurologic: Grossly  normal, AO x 3, no focal deficits, CN II - XII intact  Psych: Appropriate affect, tearful at times   Lab Results: Results for orders placed during the hospital encounter of 03/24/12 (from the past 24 hour(s))  CBC     Status: Abnormal   Collection Time   03/28/12  6:50 AM      Component Value Range   WBC 6.5  4.0 - 10.5 K/uL   RBC 2.03 (*) 3.87 - 5.11 MIL/uL   Hemoglobin 7.3 (*) 12.0 - 15.0 g/dL   HCT 16.1 (*) 09.6 - 04.5 %   MCV 100.5 (*) 78.0 - 100.0 fL   MCH 36.0 (*) 26.0 - 34.0 pg   MCHC 35.8  30.0 - 36.0 g/dL   RDW 40.9 (*) 81.1 - 91.4 %   Platelets 148 (*) 150 - 400 K/uL  COMPREHENSIVE METABOLIC PANEL     Status: Abnormal   Collection Time   03/28/12  6:50 AM      Component Value Range   Sodium 140  135 - 145 mEq/L   Potassium 3.5  3.5 - 5.1 mEq/L   Chloride 106  96 - 112 mEq/L   CO2 29  19 - 32 mEq/L   Glucose, Bld 94  70 - 99 mg/dL   BUN 3 (*) 6 - 23 mg/dL   Creatinine, Ser 7.82  0.50 - 1.10 mg/dL   Calcium 8.2 (*) 8.4 - 10.5 mg/dL   Total Protein 6.1  6.0 - 8.3 g/dL   Albumin 2.5 (*) 3.5 - 5.2 g/dL   AST 67 (*) 0 - 37 U/L   ALT 36 (*) 0 - 35 U/L   Alkaline Phosphatase 143 (*) 39 -  117 U/L   Total Bilirubin 6.6 (*) 0.3 - 1.2 mg/dL   GFR calc non Af Amer >90  >90 mL/min   GFR calc Af Amer >90  >90 mL/min    Studies/Results: No results found.   Medications:  Allergies  Allergen Reactions  . Latex Other (See Comments)    REACTION: Pt experiences a burning sensation on contacted skin areas  . Lisinopril Other (See Comments) and Cough    REACTION: Sore/scratchy throat  . Tape Other (See Comments)    REACTION: Pt. Experiences a burning sensation on contacted skin areas  . Tylenol (Acetaminophen) Other (See Comments)    Pt. Does not take the following due to protection of kidney health. Past occurrence of Protein in urine.     Current Facility-Administered Medications  Medication Dose Route Frequency Provider Last Rate Last Dose  . acidophilus (RISAQUAD)  capsule 2 capsule  2 capsule Oral Daily Keitha Butte, NP      . albumin human 25 % solution 50 g  50 g Intravenous Once Keitha Butte, NP   50 g at 03/28/12 1114  . ALPRAZolam Prudy Feeler) tablet 0.25 mg  0.25 mg Oral QID PRN Grayce Sessions, NP   0.25 mg at 03/27/12 2244  . azithromycin (ZITHROMAX) tablet 500 mg  500 mg Oral Daily Keitha Butte, NP       Followed by  . azithromycin (ZITHROMAX) tablet 250 mg  250 mg Oral Daily Keitha Butte, NP      . benzonatate (TESSALON) capsule 100 mg  100 mg Oral TID Keitha Butte, NP      . dextrose 5 %-0.45 % sodium chloride infusion   Intravenous Continuous Keitha Butte, NP 125 mL/hr at 03/28/12 0137    . diphenhydrAMINE (BENADRYL) capsule 25-50 mg  25-50 mg Oral Q4H PRN Grayce Sessions, NP       Or  . diphenhydrAMINE (BENADRYL) injection 12.5-25 mg  12.5-25 mg Intravenous Q4H PRN Grayce Sessions, NP   25 mg at 03/28/12 0819  . diphenhydrAMINE (BENADRYL) injection 25 mg  25 mg Intravenous Once Keitha Butte, NP      . enoxaparin (LOVENOX) injection 40 mg  40 mg Subcutaneous Q24H Keitha Butte, NP      . escitalopram (LEXAPRO) tablet 5 mg  5 mg Oral Daily Grayce Sessions, NP   5 mg at 03/28/12 1018  . fluconazole (DIFLUCAN) tablet 100 mg  100 mg Oral Daily Keitha Butte, NP      . folic acid (FOLVITE) tablet 1 mg  1 mg Oral Daily Grayce Sessions, NP   1 mg at 03/28/12 1018  . guaiFENesin (MUCINEX) 12 hr tablet 600 mg  600 mg Oral BID Keitha Butte, NP      . hydrocortisone cream 1 %   Topical TID Grayce Sessions, NP      . HYDROmorphone (DILAUDID) injection 2-4 mg  2-4 mg Intravenous Q2H Grayce Sessions, NP   4 mg at 03/28/12 1016  . hydroxyurea (HYDREA) capsule 1,000 mg  1,000 mg Oral Q48H Keitha Butte, NP      . hydroxyurea (HYDREA) capsule 500 mg  500 mg Oral Q48H Keitha Butte, NP      . levalbuterol (XOPENEX) nebulizer solution 0.63 mg  0.63 mg Nebulization  Q12H Keitha Butte, NP      . ondansetron (ZOFRAN) tablet 4 mg  4 mg Oral Q4H PRN Grayce Sessions, NP  Or  . ondansetron (ZOFRAN) injection 4 mg  4 mg Intravenous Q4H PRN Grayce Sessions, NP   4 mg at 03/28/12 1016  . potassium chloride 10 mEq in 100 mL IVPB  10 mEq Intravenous Q1 Hr x 2 Grayce Sessions, NP   10 mEq at 03/27/12 1405  . potassium chloride SA (K-DUR,KLOR-CON) CR tablet 20 mEq  20 mEq Oral Daily Keitha Butte, NP      . sodium chloride 0.9 % injection 10-40 mL  10-40 mL Intracatheter Q12H Gwenyth Bender, MD   10 mL at 03/28/12 1019  . sodium chloride 0.9 % injection 10-40 mL  10-40 mL Intracatheter PRN Gwenyth Bender, MD   70 mL at 03/26/12 2145  . SUMAtriptan (IMITREX) tablet 50 mg  50 mg Oral Q4H PRN Gwenyth Bender, MD   50 mg at 03/27/12 2058  . DISCONTD: enoxaparin (LOVENOX) injection 30 mg  30 mg Subcutaneous Q24H Gwenyth Bender, MD   30 mg at 03/28/12 1018  . DISCONTD: guaiFENesin (ROBITUSSIN) 100 MG/5ML solution 100 mg  5 mL Oral Q4H PRN Grayce Sessions, NP      . DISCONTD: guaiFENesin (ROBITUSSIN) 100 MG/5ML solution 100 mg  5 mL Oral Q6H Grayce Sessions, NP   100 mg at 03/27/12 1613  . DISCONTD: hydroxyurea (HYDREA) capsule 500 mg  500 mg Oral BID Keitha Butte, NP      . DISCONTD: hydroxyurea (HYDREA) capsule 500-1,000 mg  500-1,000 mg Oral UD Keitha Butte, NP      . DISCONTD: ibuprofen (ADVIL,MOTRIN) tablet 400 mg  400 mg Oral QID Gwenyth Bender, MD   400 mg at 03/28/12 1018  . DISCONTD: potassium chloride SA (K-DUR,KLOR-CON) CR tablet 20 mEq  20 mEq Oral BID Grayce Sessions, NP   20 mEq at 03/28/12 1018  . DISCONTD: senna (SENOKOT) tablet 17.2 mg  2 tablet Oral QHS Grayce Sessions, NP   17.2 mg at 03/27/12 2231   Assessment/Plan: Patient Active Problem List  Diagnosis  . Sickle cell crisis  . Sickle cell disease  . Elevated LFTs  . Sickle cell anemia  . Fever  . Leukocytosis  . HTN (hypertension)  . Hypokalemia  .  Hypoalbuminemia  . Bronchitis  . Oral candidiasis  . Reactive depression (situational)   Sickle cell crisis:  The patient will receive blood exchange x 2 once blood is ready.  The patient will continue receiving pain/nausea/pruritis/bowel management.  The patient is also receiving her home medications and IV hydration.  CMP and CBC in the am.  Hemoglobinopathy is pending. Bronchitis/Oral Candidiasis:  The patient will be starting antibiotic therapy with a five-day ZPak today.   A short course of Diflucan is also ordered. The patient is receiving Tessalon for her cough, Mucinex to loosen secretions and scheduled nebulizer treatments.  IS usage is encouraged. Depression:  The patient is receiving PO Lexapro daily Hypokalemia:  The patient will continue to receive PO KCL supplementation daily  Discussed and agreed upon plan of care with the patient.   The plan of care will be adjusted based on the patient's clinical progress.   Larina Bras, NP-C 03/28/2012, 10:43 AM

## 2012-03-28 NOTE — Plan of Care (Signed)
Problem: Phase II Progression Outcomes Goal: Tolerating diet Outcome: Progressing Pt c/o poor appetite at the present.

## 2012-03-28 NOTE — Progress Notes (Signed)
Pt refused Robitussin syrup tonight, stating that it doesn't work. Pt would like to try Mucinex instead.

## 2012-03-29 LAB — COMPREHENSIVE METABOLIC PANEL
AST: 57 U/L — ABNORMAL HIGH (ref 0–37)
Albumin: 3 g/dL — ABNORMAL LOW (ref 3.5–5.2)
CO2: 28 mEq/L (ref 19–32)
Calcium: 8.4 mg/dL (ref 8.4–10.5)
Creatinine, Ser: 0.67 mg/dL (ref 0.50–1.10)
GFR calc non Af Amer: >90 mL/min (ref >90)
Total Protein: 6.2 g/dL (ref 6.0–8.3)

## 2012-03-29 LAB — CBC
MCH: 33.6 pg (ref 26.0–34.0)
MCHC: 35.3 g/dL (ref 30.0–36.0)
MCV: 95.2 fL (ref 78.0–100.0)
Platelets: 136 10*3/uL — ABNORMAL LOW (ref 150–400)
RDW: 23.9 % — ABNORMAL HIGH (ref 11.5–15.5)

## 2012-03-29 NOTE — Progress Notes (Signed)
Subjective:  Feeling better. + cough.  Objective:  Vital Signs in the last 24 hours: Temp:  [97.9 F (36.6 C)-98.8 F (37.1 C)] 98.6 F (37 C) (07/06 0615) Pulse Rate:  [92-112] 101  (07/06 0615) Cardiac Rhythm:  [-]  Resp:  [16-22] 18  (07/06 0615) BP: (103-124)/(67-82) 115/79 mmHg (07/06 0615) SpO2:  [90 %-98 %] 90 % (07/06 0432) Weight:  [72.802 kg (160 lb 8 oz)] 72.802 kg (160 lb 8 oz) (07/06 0432)  Physical Exam: BP Readings from Last 1 Encounters:  03/29/12 115/79    Wt Readings from Last 1 Encounters:  03/29/12 72.802 kg (160 lb 8 oz)    Weight change: 0.302 kg (10.7 oz)  HEENT: [place]/AT, Eyes-Brown, PERL, EOMI, Conjunctiva-Pale pink, Sclera-Non-icteric Neck: No JVD, No bruit, Trachea midline. Lungs:  Clear, Bilateral. Cardiac:  Regular rhythm, normal S1 and S2, no S3.  Abdomen:  Soft, non-tender. Extremities:  Trace edema present. No cyanosis. No clubbing. CNS: AxOx3, Cranial nerves grossly intact, moves all 4 extremities. Right handed. Skin: Warm and dry.   Intake/Output from previous day: 07/05 0701 - 07/06 0700 In: 2888.8 [P.O.:240; I.V.:2013.8; Blood:635] Out: 300     Lab Results: BMET    Component Value Date/Time   NA 138 03/29/2012 0845   K 3.7 03/29/2012 0845   CL 103 03/29/2012 0845   CO2 28 03/29/2012 0845   GLUCOSE 95 03/29/2012 0845   BUN 5* 03/29/2012 0845   CREATININE 0.67 03/29/2012 0845   CALCIUM 8.4 03/29/2012 0845   GFRNONAA >90 03/29/2012 0845   GFRAA >90 03/29/2012 0845   CBC    Component Value Date/Time   WBC 8.1 03/29/2012 0845   RBC 2.71* 03/29/2012 0845   HGB 9.1* 03/29/2012 0845   HCT 25.8* 03/29/2012 0845   PLT 136* 03/29/2012 0845   MCV 95.2 03/29/2012 0845   MCH 33.6 03/29/2012 0845   MCHC 35.3 03/29/2012 0845   RDW 23.9* 03/29/2012 0845   LYMPHSABS 2.8 03/25/2012 0847   MONOABS 1.1* 03/25/2012 0847   EOSABS 0.1 03/25/2012 0847   BASOSABS 0.1 03/25/2012 0847   CARDIAC ENZYMES Lab Results  Component Value Date   CKTOTAL 21 08/20/2010   CKMB 0.4 08/20/2010     TROPONINI <0.30 09/24/2011    Assessment/Plan:  Patient Active Hospital Problem List: Sickle Cell Anemia with crisis Bronchitis (03/28/2012) Oral candidiasis (03/28/2012) Reactive depression (situational) (03/28/2012)  Blood work and continue medications    LOS: 5 days    Orpah Yanni  MD  03/29/2012, 10:34 AM

## 2012-03-30 LAB — COMPREHENSIVE METABOLIC PANEL
ALT: 30 U/L (ref 0–35)
Alkaline Phosphatase: 140 U/L — ABNORMAL HIGH (ref 39–117)
CO2: 30 mEq/L (ref 19–32)
Calcium: 8.7 mg/dL (ref 8.4–10.5)
GFR calc Af Amer: >90 mL/min (ref >90)
GFR calc non Af Amer: >90 mL/min (ref >90)
Glucose, Bld: 93 mg/dL (ref 70–99)
Sodium: 138 mEq/L (ref 135–145)

## 2012-03-30 LAB — TYPE AND SCREEN
Unit division: 0
Unit division: 0

## 2012-03-30 LAB — CBC
Hemoglobin: 9.2 g/dL — ABNORMAL LOW (ref 12.0–15.0)
MCH: 32.7 pg (ref 26.0–34.0)
RBC: 2.81 MIL/uL — ABNORMAL LOW (ref 3.87–5.11)

## 2012-03-30 MED ORDER — VITAMINS A & D EX OINT
TOPICAL_OINTMENT | CUTANEOUS | Status: AC
  Administered 2012-03-30: 12:00:00
  Filled 2012-03-30: qty 5

## 2012-03-30 MED ORDER — POTASSIUM CHLORIDE CRYS ER 20 MEQ PO TBCR
20.0000 meq | EXTENDED_RELEASE_TABLET | Freq: Two times a day (BID) | ORAL | Status: DC
  Administered 2012-03-30 – 2012-04-01 (×5): 20 meq via ORAL
  Filled 2012-03-30 (×6): qty 1

## 2012-03-30 NOTE — Progress Notes (Signed)
Subjective:  + cough.  Objective:  Vital Signs in the last 24 hours: Temp:  [97.6 F (36.4 C)-98.9 F (37.2 C)] 97.6 F (36.4 C) (07/07 1028) Pulse Rate:  [81-99] 92  (07/07 1028) Cardiac Rhythm:  [-]  Resp:  [18-20] 18  (07/07 1028) BP: (115-133)/(72-85) 115/76 mmHg (07/07 1028) SpO2:  [89 %-97 %] 97 % (07/07 1028) Weight:  [71.668 kg (158 lb)] 71.668 kg (158 lb) (07/07 0539)  Physical Exam: BP Readings from Last 1 Encounters:  03/30/12 115/76    Wt Readings from Last 1 Encounters:  03/30/12 71.668 kg (158 lb)    Weight change: -1.134 kg (-2 lb 8 oz)  HEENT: /AT, Eyes-Brown, PERL, EOMI, Conjunctiva-Pale pink, Sclera-Non-icteric Neck: No JVD, No bruit, Trachea midline. Lungs:  Harsh on cough, Bilateral. Cardiac:  Regular rhythm, normal S1 and S2, no S3.  Abdomen:  Soft, non-tender. Extremities:  No edema present. No cyanosis. No clubbing. CNS: AxOx3, Cranial nerves grossly intact, moves all 4 extremities. Right handed. Skin: Warm and dry.   Intake/Output from previous day: 07/06 0701 - 07/07 0700 In: 2584 [P.O.:1320; I.V.:1264] Out: -     Lab Results: BMET    Component Value Date/Time   NA 138 03/30/2012 0600   K 3.2* 03/30/2012 0600   CL 101 03/30/2012 0600   CO2 30 03/30/2012 0600   GLUCOSE 93 03/30/2012 0600   BUN 5* 03/30/2012 0600   CREATININE 0.65 03/30/2012 0600   CALCIUM 8.7 03/30/2012 0600   GFRNONAA >90 03/30/2012 0600   GFRAA >90 03/30/2012 0600   CBC    Component Value Date/Time   WBC 6.6 03/30/2012 0600   RBC 2.81* 03/30/2012 0600   HGB 9.2* 03/30/2012 0600   HCT 26.6* 03/30/2012 0600   PLT 137* 03/30/2012 0600   MCV 94.7 03/30/2012 0600   MCH 32.7 03/30/2012 0600   MCHC 34.6 03/30/2012 0600   RDW 23.6* 03/30/2012 0600   LYMPHSABS 2.8 03/25/2012 0847   MONOABS 1.1* 03/25/2012 0847   EOSABS 0.1 03/25/2012 0847   BASOSABS 0.1 03/25/2012 0847   CARDIAC ENZYMES Lab Results  Component Value Date   CKTOTAL 21 08/20/2010   CKMB 0.4 08/20/2010   TROPONINI <0.30 09/24/2011     Assessment/Plan:  Patient Active Hospital Problem List:  Sickle Cell Anemia with crisis  Bronchitis (03/28/2012) Oral candidiasis (03/28/2012) Reactive depression (situational) (03/28/2012) Hypokalemia  Continue medical treatment   LOS: 6 days    Orpah Cory  MD  03/30/2012, 11:49 AM

## 2012-03-31 LAB — CBC
Hemoglobin: 9.4 g/dL — ABNORMAL LOW (ref 12.0–15.0)
MCHC: 34.7 g/dL (ref 30.0–36.0)
Platelets: 126 10*3/uL — ABNORMAL LOW (ref 150–400)
RDW: 22.9 % — ABNORMAL HIGH (ref 11.5–15.5)

## 2012-03-31 LAB — COMPREHENSIVE METABOLIC PANEL
ALT: 29 U/L (ref 0–35)
AST: 56 U/L — ABNORMAL HIGH (ref 0–37)
Albumin: 3 g/dL — ABNORMAL LOW (ref 3.5–5.2)
Alkaline Phosphatase: 156 U/L — ABNORMAL HIGH (ref 39–117)
Potassium: 3.6 mEq/L (ref 3.5–5.1)
Sodium: 137 mEq/L (ref 135–145)
Total Protein: 6.8 g/dL (ref 6.0–8.3)

## 2012-03-31 MED ORDER — ALBUTEROL SULFATE HFA 108 (90 BASE) MCG/ACT IN AERS
2.0000 | INHALATION_SPRAY | Freq: Two times a day (BID) | RESPIRATORY_TRACT | Status: DC
  Administered 2012-04-01 (×2): 2 via RESPIRATORY_TRACT
  Filled 2012-03-31: qty 6.7

## 2012-03-31 MED ORDER — DIPHENHYDRAMINE HCL 50 MG/ML IJ SOLN
25.0000 mg | Freq: Once | INTRAMUSCULAR | Status: AC
  Administered 2012-04-01: 25 mg via INTRAVENOUS
  Filled 2012-03-31: qty 0.5
  Filled 2012-03-31: qty 1

## 2012-03-31 MED ORDER — ALBUMIN HUMAN 25 % IV SOLN
25.0000 g | Freq: Once | INTRAVENOUS | Status: AC
  Administered 2012-03-31: 25 g via INTRAVENOUS
  Filled 2012-03-31: qty 100

## 2012-03-31 MED ORDER — POLYETHYLENE GLYCOL 3350 17 G PO PACK
17.0000 g | PACK | Freq: Every day | ORAL | Status: DC
  Administered 2012-03-31 – 2012-04-01 (×2): 17 g via ORAL
  Filled 2012-03-31 (×2): qty 1

## 2012-03-31 MED ORDER — ALBUTEROL SULFATE (5 MG/ML) 0.5% IN NEBU: 2.5000 mg | INHALATION_SOLUTION | Freq: Four times a day (QID) | RESPIRATORY_TRACT | Status: DC | PRN

## 2012-03-31 MED ORDER — ALBUTEROL SULFATE HFA 108 (90 BASE) MCG/ACT IN AERS
2.0000 | INHALATION_SPRAY | RESPIRATORY_TRACT | Status: DC | PRN
Start: 2012-03-31 — End: 2012-04-02

## 2012-03-31 NOTE — Progress Notes (Signed)
[facility] Progress Note   Subjective: The patient was seen on rounds today.  The patient was sitting quietly on the side of her bed and was later visited by two female visitors.  The patient is complaining of pain 5/10 in her lower back and bilateral LE's.  The patient states that her cough has improved and her chest feels less congested.    The patient has not had a bowel movement, but states that Miralax works for her.  Explained and agreed upon the patient receiving blood exchange x 2 and an Albumin infusion.  The patient states that she may be ready for discharge within the next 24-48 hours.  No other nursing or patient concerns.  Objective: Vital signs in last 24 hours: Blood pressure 126/84, pulse 83, temperature 98 F (36.7 C), temperature source Oral, resp. rate 18, height 5\' 3"  (1.6 m), weight 158 lb (71.668 kg), SpO2 99.00%.  General appearance: Alert, cooperative, well nourished, well developed, no apparent distress  Head: Normocephalic, without obvious abnormality, atraumatic  Eyes: PERRLA, EOMI, mild scleral icterus Nose: Nares, septum and mucosa are normal, no sinus tenderness  Throat: Lips, mucosa, and tongue are normal; teeth and gums are normal  Neck: No adenopathy, supple, symmetrical, trachea midline and thyroid not enlarged, symmetric, no tenderness/mass/nodules  Back: Symmetric, no curvature, improved ROM, no CVA tenderness, diffuse lower/middle tenderness  Cardio: Regular rate and rhythm, S1, S2 normal, no murmur/click/rub/gallop GI: Soft, non-tender, distended, hypoactive bowel sounds, no organomegaly  Extremities: Extremities normal, atraumatic, no cyanosis or edema, tender bilateral LEs  Pulses: 2+ and symmetric  Skin: Skin color, texture, turgor normal, no rashes or lesions, tattoos, well healed scars on trunk and extremities  Neurologic: Grossly normal, AO x 3, no focal deficits, CN II - XII intact  Psych: Appropriate affect   Lab Results: Results  for orders placed during the hospital encounter of 03/24/12 (from the past 24 hour(s))  CBC     Status: Abnormal   Collection Time   03/31/12  6:00 AM      Component Value Range   WBC 5.9  4.0 - 10.5 K/uL   RBC 2.86 (*) 3.87 - 5.11 MIL/uL   Hemoglobin 9.4 (*) 12.0 - 15.0 g/dL   HCT 82.9 (*) 56.2 - 13.0 %   MCV 94.8  78.0 - 100.0 fL   MCH 32.9  26.0 - 34.0 pg   MCHC 34.7  30.0 - 36.0 g/dL   RDW 86.5 (*) 78.4 - 69.6 %   Platelets 126 (*) 150 - 400 K/uL  COMPREHENSIVE METABOLIC PANEL     Status: Abnormal   Collection Time   03/31/12  6:00 AM      Component Value Range   Sodium 137  135 - 145 mEq/L   Potassium 3.6  3.5 - 5.1 mEq/L   Chloride 101  96 - 112 mEq/L   CO2 28  19 - 32 mEq/L   Glucose, Bld 104 (*) 70 - 99 mg/dL   BUN 6  6 - 23 mg/dL   Creatinine, Ser 2.95  0.50 - 1.10 mg/dL   Calcium 8.8  8.4 - 28.4 mg/dL   Total Protein 6.8  6.0 - 8.3 g/dL   Albumin 3.0 (*) 3.5 - 5.2 g/dL   AST 56 (*) 0 - 37 U/L   ALT 29  0 - 35 U/L   Alkaline Phosphatase 156 (*) 39 - 117 U/L   Total Bilirubin 4.2 (*) 0.3 - 1.2 mg/dL   GFR  calc non Af Amer >90  >90 mL/min   GFR calc Af Amer >90  >90 mL/min    Studies/Results: No results found.   Medications:  Allergies  Allergen Reactions  . Desferal (Deferoxamine) Hives  . Latex Other (See Comments)    REACTION: Pt experiences a burning sensation on contacted skin areas  . Lisinopril Other (See Comments) and Cough    REACTION: Sore/scratchy throat  . Tape Other (See Comments)    REACTION: Pt. Experiences a burning sensation on contacted skin areas  . Tylenol (Acetaminophen) Other (See Comments)    Pt. Does not take the following due to protection of kidney health. Past occurrence of Protein in urine.     Current Facility-Administered Medications  Medication Dose Route Frequency Provider Last Rate Last Dose  . acidophilus (RISAQUAD) capsule 2 capsule  2 capsule Oral Daily Keitha Butte, NP   2 capsule at 03/31/12 0959  . albumin human  25 % solution 25 g  25 g Intravenous Once Keitha Butte, NP      . ALPRAZolam Prudy Feeler) tablet 0.25 mg  0.25 mg Oral QID PRN Grayce Sessions, NP   0.25 mg at 03/28/12 2227  . azithromycin (ZITHROMAX) tablet 250 mg  250 mg Oral Daily Keitha Butte, NP   250 mg at 03/31/12 1000  . benzonatate (TESSALON) capsule 100 mg  100 mg Oral TID Keitha Butte, NP   100 mg at 03/31/12 1000  . dextrose 5 %-0.45 % sodium chloride infusion   Intravenous Continuous Ricki Rodriguez, MD 50 mL/hr at 03/31/12 1019    . diphenhydrAMINE (BENADRYL) capsule 25-50 mg  25-50 mg Oral Q4H PRN Grayce Sessions, NP       Or  . diphenhydrAMINE (BENADRYL) injection 12.5-25 mg  12.5-25 mg Intravenous Q4H PRN Grayce Sessions, NP   25 mg at 03/29/12 1617  . diphenhydrAMINE (BENADRYL) injection 25 mg  25 mg Intravenous Once Keitha Butte, NP      . enoxaparin (LOVENOX) injection 40 mg  40 mg Subcutaneous Q24H Keitha Butte, NP   40 mg at 03/31/12 1001  . escitalopram (LEXAPRO) tablet 5 mg  5 mg Oral Daily Grayce Sessions, NP   5 mg at 03/31/12 1022  . folic acid (FOLVITE) tablet 1 mg  1 mg Oral Daily Grayce Sessions, NP   1 mg at 03/31/12 0959  . guaiFENesin (MUCINEX) 12 hr tablet 600 mg  600 mg Oral BID Keitha Butte, NP   600 mg at 03/31/12 0959  . hydrocortisone cream 1 %   Topical TID Grayce Sessions, NP      . HYDROmorphone (DILAUDID) injection 2-4 mg  2-4 mg Intravenous Q2H Grayce Sessions, NP   2 mg at 03/31/12 1413  . hydroxyurea (HYDREA) capsule 1,000 mg  1,000 mg Oral Q48H Keitha Butte, NP   1,000 mg at 03/31/12 1002  . hydroxyurea (HYDREA) capsule 500 mg  500 mg Oral Q48H Keitha Butte, NP   500 mg at 03/30/12 1209  . levalbuterol (XOPENEX) nebulizer solution 0.63 mg  0.63 mg Nebulization Q12H Keitha Butte, NP   0.63 mg at 03/31/12 0856  . ondansetron (ZOFRAN) tablet 4 mg  4 mg Oral Q4H PRN Grayce Sessions, NP       Or  . ondansetron (ZOFRAN)  injection 4 mg  4 mg Intravenous Q4H PRN Grayce Sessions, NP   4 mg at 03/29/12 1754  .  polyethylene glycol (MIRALAX / GLYCOLAX) packet 17 g  17 g Oral Daily Keitha Butte, NP      . potassium chloride SA (K-DUR,KLOR-CON) CR tablet 20 mEq  20 mEq Oral BID Ricki Rodriguez, MD   20 mEq at 03/31/12 1000  . sodium chloride 0.9 % injection 10-40 mL  10-40 mL Intracatheter Q12H Gwenyth Bender, MD   10 mL at 03/31/12 1017  . sodium chloride 0.9 % injection 10-40 mL  10-40 mL Intracatheter PRN Gwenyth Bender, MD   20 mL at 03/29/12 0545  . SUMAtriptan (IMITREX) tablet 50 mg  50 mg Oral Q4H PRN Gwenyth Bender, MD   50 mg at 03/27/12 2058   Assessment/Plan: Patient Active Problem List  Diagnosis  . Sickle cell crisis  . Sickle cell disease  . Elevated LFTs  . Sickle cell anemia  . Fever  . Leukocytosis  . HTN (hypertension)  . Hypokalemia  . Hypoalbuminemia  . Bronchitis  . Oral candidiasis  . Reactive depression (situational)   Sickle cell crisis:  The patient will receive blood exchange x 2 once blood is ready.  The patient will continue receiving pain/nausea/pruritis/bowel management.  The patient is also receiving her home medications and IV hydration.  CMP and CBC in the am.  Hemoglobinopathy has been received. Bronchitis/Oral Candidiasis:  The patient will be starting antibiotic therapy with a five-day ZPak today.  The patient is receiving Tessalon for her cough, Mucinex to loosen secretions and scheduled nebulizer treatments.  IS usage is encouraged.  The patient received a short course of Diflucan. Depression:  The patient is receiving PO Lexapro daily Hypokalemia:  The patient will continue to receive PO KCL supplementation BID.  Discussed and agreed upon plan of care with the patient.   The plan of care will be adjusted based on the patient's clinical progress. Progressing toward discharge within the next 24 - 48 hours.  Larina Bras, NP-C 03/31/2012, 2:38 PM

## 2012-04-01 LAB — COMPREHENSIVE METABOLIC PANEL
ALT: 28 U/L (ref 0–35)
Albumin: 3.3 g/dL — ABNORMAL LOW (ref 3.5–5.2)
Alkaline Phosphatase: 160 U/L — ABNORMAL HIGH (ref 39–117)
BUN: 7 mg/dL (ref 6–23)
Calcium: 8.9 mg/dL (ref 8.4–10.5)
Creatinine, Ser: 0.59 mg/dL (ref 0.50–1.10)
Creatinine, Ser: 0.64 mg/dL (ref 0.50–1.10)
GFR calc Af Amer: >90 mL/min (ref >90)
GFR calc non Af Amer: >90 mL/min (ref >90)
Glucose, Bld: 86 mg/dL (ref 70–99)
Potassium: 4.1 mEq/L (ref 3.5–5.1)
Sodium: 137 mEq/L (ref 135–145)
Total Protein: 6.9 g/dL (ref 6.0–8.3)
Total Protein: 7 g/dL (ref 6.0–8.3)

## 2012-04-01 LAB — CBC
HCT: 31.5 % — ABNORMAL LOW (ref 36.0–46.0)
Hemoglobin: 9.3 g/dL — ABNORMAL LOW (ref 12.0–15.0)
MCH: 33.2 pg (ref 26.0–34.0)
MCHC: 35.4 g/dL (ref 30.0–36.0)
MCV: 93.9 fL (ref 78.0–100.0)
MCV: 90.3 fL (ref 78.0–100.0)
RBC: 3.49 MIL/uL — ABNORMAL LOW (ref 3.87–5.11)
WBC: 4.9 10*3/uL (ref 4.0–10.5)

## 2012-04-01 MED ORDER — OXYCODONE HCL 5 MG PO CAPS: 10.0000 mg | ORAL_CAPSULE | ORAL | Status: DC | PRN

## 2012-04-01 MED ORDER — DIPHENHYDRAMINE HCL 50 MG/ML IJ SOLN
25.0000 mg | Freq: Once | INTRAMUSCULAR | Status: AC
  Administered 2012-04-01: 25 mg via INTRAVENOUS
  Filled 2012-04-01: qty 1

## 2012-04-01 NOTE — Plan of Care (Signed)
Problem: Phase I Progression Outcomes Goal: Pain controlled with appropriate interventions Outcome: Adequate for Discharge Pt states that with pain medicine, pain becomes a 3/10, which she believes to be manageable for her at home.

## 2012-04-01 NOTE — Discharge Summary (Signed)
[facility] Discharge Summary  Patient ID: Dawn [name] MRN: [id number] DOB/AGE: 03-28-1981 31 y.o.  Admit date: 03/24/2012 Discharge date: 04/01/2012  Admission Diagnoses: Sickle cell Crisis Hemochromatosis Anemia  Depression   Discharge Diagnoses:   Sickle cell crisis  Hypokalemia  Hypoalbuminemia  Bronchitis  Oral candidiasis  Reactive depression (situational)   Discharged Condition: Stable  Hospital Course:  Please refer to the H&P for details regarding this patient's admission.  In short, Dawn [name] is a very pleasant African-American female with a PMH significant for sickle cell disease (type SS) and situational depression.  The patient presented to the [facility] complaining of diffuse arthralgias for several weeks prior to her arrival.  Upon admission to the hospital, the patient received aggressive pain/nausea/pruritis/bowel management, IV hydration, her home medications, four blood exchange transfusions, electrolyte and albumin repletion, IV antibiotic therapy with a short course of anti-fungal therapy, oxygen therapay and nebulizer treatments.  The patient is completing the last blood exchange and states that she is ready to manage her pain at home.  The patient is currently in stable condition and will be discharged an hour after the blood exchange is complete and final laboratories are drawn.  Consults: None  Significant Diagnostic Studies:  No results found.  Treatments: IV hydration, Antibiotics/Anti-Fungal: Azithromycin and Diflucan, Analgesia: Dilaudid, Anticoagulation: Lovenox, Respiratory Therapy: O2 and Nebulizer treatments, Procedures: PICC line and Blood exchange transfusion x 4  Discharge Exam: Blood pressure 115/78, pulse 82, temperature 98.3 F (36.8 C), temperature source Oral, resp. rate 18, height 5\' 3"  (1.6 m), weight 158 lb (71.668 kg), SpO2 95.00%.  General appearance: Alert, cooperative, well nourished, well developed, no apparent distress    Head: Normocephalic, without obvious abnormality, atraumatic  Eyes: PERRLA, EOMI, mild scleral icterus  Nose: Nares, septum and mucosa are normal, no sinus tenderness  Throat: Lips, mucosa, and tongue are normal; teeth and gums are normal  Neck: No adenopathy, supple, symmetrical, trachea midline and thyroid not enlarged, symmetric, no tenderness/mass/nodules  Back: Symmetric, no curvature, improved ROM, no CVA tenderness, diffuse lower/middle tenderness  Cardio: Regular rate and rhythm, S1, S2 normal, no murmur/click/rub/gallop  GI: Soft, non-tender, distended, hypoactive bowel sounds, no organomegaly  Extremities: Extremities normal, atraumatic, no cyanosis or edema, tender bilateral LEs  Pulses: 2+ and symmetric  Skin: Skin color, texture, turgor normal, no rashes or lesions, tattoos, well healed scars on trunk and extremities  Neurologic: Grossly normal, AO x 3, no focal deficits, CN II - XII intact  Psych: Appropriate affect   Disposition: 01-Home or Self Care  Discharge Orders    Future Orders Please Complete By Expires   Increase activity slowly      Call MD for:  severe uncontrolled pain      Discharge instructions      Comments:   Take all medications as prescribed Keep well hydrated with water Continue to use incentive spirometer daily Keep warm FOLLOW-UP APPOINTMENT WITH DR. DEAN IS ON April 08, 2012 AT 2:00 PM Please go to the nearest emergency room if you experience any symptoms tonight once you go home (i.e. - fever, chills, temperature, swelling, difficulty breathing or swallowing etc..) Rest Avoid stress     Medication List  As of 04/01/2012  3:44 PM   TAKE these medications         cetirizine 10 MG tablet   Commonly known as: ZYRTEC   Take 10 mg by mouth daily as needed. For allergies      cholecalciferol 1000 UNITS  tablet   Commonly known as: VITAMIN D   Take 1,000 Units by mouth daily.      folic acid 1 MG tablet   Commonly known as: FOLVITE   Take 1 mg  by mouth daily.      hydroxyurea 500 MG capsule   Commonly known as: HYDREA   Take 500-1,000 mg by mouth as directed. Take 500mg  (1 tablet) ON ODD DAYS OF THE MONTH AND 1000 MG (2) TABLETS ON THE EVEN DAYS.      oxycodone 5 MG capsule   Commonly known as: OXY-IR   Take 2 capsules (10 mg total) by mouth every 4 (four) hours as needed. For pain           Follow-up Information    Follow up with Willey Blade, MD on 04/08/2012. (Go to the ER  if symptoms worsen)    Contact information:   [address] [facility] [place] [address] [phone]         Discharge Time:  Greater than 30 minutes  Signed: Larina Bras, NP-C 04/01/2012, 3:44 PM

## 2012-04-01 NOTE — Progress Notes (Signed)
Dawn [name] received discharge instructions around shift change and the IV team was paged by [facility] RN. After not coming for awhile, me and the secretary paged the IV team three more times. We received no response and so I paged the [facility] [name] around 9:10pm. The IV team nurse called 3rd floor and said he would come in approximately 10 minutes. Finally at 9:55pm, the IV nurse came and removed Dawn [name]'s PICC line.  She was then able to leave at 10:05pm. Angelena Form, RN

## 2012-04-01 NOTE — Progress Notes (Signed)
Blood Exchange as ordered. Started at 1048 and ended at 23. Pt. Tolerated well. Total normal saline flush used was 60ml.

## 2012-04-02 LAB — TYPE AND SCREEN
DAT, IgG: NEGATIVE
Unit division: 0
Unit division: 0

## 2012-04-04 ENCOUNTER — Telehealth (HOSPITAL_COMMUNITY): Payer: Self-pay | Admitting: *Deleted

## 2012-04-04 NOTE — Telephone Encounter (Signed)
Incorrect phone number for this patient.

## 2012-04-08 DIAGNOSIS — D5720 Sickle-cell/Hb-C disease without crisis: Secondary | ICD-10-CM | POA: Diagnosis not present

## 2012-04-08 DIAGNOSIS — D5700 Hb-SS disease with crisis, unspecified: Secondary | ICD-10-CM | POA: Diagnosis not present

## 2012-05-20 ENCOUNTER — Non-Acute Institutional Stay (HOSPITAL_COMMUNITY)
Admission: AD | Admit: 2012-05-20 | Discharge: 2012-05-20 | Disposition: A | Discharge status: Home / Self Care | Payer: Medicare Other | Attending: Internal Medicine | Admitting: Internal Medicine

## 2012-05-20 ENCOUNTER — Telehealth (HOSPITAL_COMMUNITY): Payer: Self-pay | Admitting: *Deleted

## 2012-05-20 ENCOUNTER — Encounter (HOSPITAL_COMMUNITY): Payer: Self-pay | Admitting: *Deleted

## 2012-05-20 DIAGNOSIS — R52 Pain, unspecified: Secondary | ICD-10-CM | POA: Insufficient documentation

## 2012-05-20 DIAGNOSIS — G8929 Other chronic pain: Secondary | ICD-10-CM | POA: Insufficient documentation

## 2012-05-20 DIAGNOSIS — D5700 Hb-SS disease with crisis, unspecified: Secondary | ICD-10-CM | POA: Insufficient documentation

## 2012-05-20 DIAGNOSIS — N390 Urinary tract infection, site not specified: Secondary | ICD-10-CM | POA: Insufficient documentation

## 2012-05-20 LAB — CBC WITH DIFFERENTIAL/PLATELET
Basophils Relative: 0 % (ref 0–1)
Eosinophils Absolute: 0.1 10*3/uL (ref 0.0–0.7)
MCH: 32 pg (ref 26.0–34.0)
MCHC: 34.5 g/dL (ref 30.0–36.0)
Monocytes Absolute: 0.8 10*3/uL (ref 0.1–1.0)
Neutrophils Relative %: 58 % (ref 43–77)
Platelets: 395 10*3/uL (ref 150–400)

## 2012-05-20 LAB — URINALYSIS, ROUTINE W REFLEX MICROSCOPIC
Bilirubin Urine: NEGATIVE
Ketones, ur: NEGATIVE mg/dL
Nitrite: POSITIVE — AB
pH: 6 (ref 5.0–8.0)

## 2012-05-20 LAB — COMPREHENSIVE METABOLIC PANEL
BUN: 8 mg/dL (ref 6–23)
CO2: 25 mEq/L (ref 19–32)
Chloride: 102 mEq/L (ref 96–112)
Creatinine, Ser: 0.54 mg/dL (ref 0.50–1.10)
GFR calc non Af Amer: >90 mL/min (ref >90)
Glucose, Bld: 80 mg/dL (ref 70–99)
Total Bilirubin: 4.1 mg/dL — ABNORMAL HIGH (ref 0.3–1.2)

## 2012-05-20 LAB — RETICULOCYTES
RBC.: 3.09 MIL/uL — ABNORMAL LOW (ref 3.87–5.11)
Retic Ct Pct: 17.4 % — ABNORMAL HIGH (ref 0.4–3.1)

## 2012-05-20 LAB — URINE MICROSCOPIC-ADD ON

## 2012-05-20 MED ORDER — HYDROMORPHONE HCL PF 2 MG/ML IJ SOLN: 4.0000 mg | INTRAMUSCULAR | Status: DC | PRN

## 2012-05-20 MED ORDER — PROMETHAZINE HCL 25 MG RE SUPP
25.0000 mg | Freq: Four times a day (QID) | RECTAL | Status: DC | PRN
  Filled 2012-05-20: qty 1

## 2012-05-20 MED ORDER — PROMETHAZINE HCL 25 MG PO TABS: 25.0000 mg | ORAL_TABLET | ORAL | Status: DC | PRN

## 2012-05-20 MED ORDER — HYDROMORPHONE HCL PF 4 MG/ML IJ SOLN
INTRAMUSCULAR | Status: AC
  Administered 2012-05-20: 4 mg via INTRAMUSCULAR
  Filled 2012-05-20: qty 1

## 2012-05-20 MED ORDER — DEXTROSE-NACL 5-0.45 % IV SOLN: INTRAVENOUS | Status: DC

## 2012-05-20 MED ORDER — PROMETHAZINE HCL 25 MG PO TABS
25.0000 mg | ORAL_TABLET | Freq: Four times a day (QID) | ORAL | Status: DC | PRN
  Administered 2012-05-20: 25 mg via ORAL
  Filled 2012-05-20: qty 1

## 2012-05-20 MED ORDER — DIPHENHYDRAMINE HCL 25 MG PO CAPS
25.0000 mg | ORAL_CAPSULE | ORAL | Status: DC | PRN
  Administered 2012-05-20: 50 mg via ORAL
  Filled 2012-05-20: qty 2

## 2012-05-20 MED ORDER — HYDROMORPHONE HCL 4 MG PO TABS
4.0000 mg | ORAL_TABLET | Freq: Once | ORAL | Status: AC
  Administered 2012-05-20: 4 mg via ORAL
  Filled 2012-05-20: qty 1

## 2012-05-20 MED ORDER — DIPHENHYDRAMINE HCL 50 MG/ML IJ SOLN: 12.5000 mg | INTRAMUSCULAR | Status: DC | PRN

## 2012-05-20 NOTE — H&P (Signed)
[facility] History and Physical   Date: 05/20/2012  Patient name: Dawn [name] Medical record number: [id number] Date of birth: Aug 03, 1981 Age: 31 y.o. Gender: female PCP: August Saucer ERIC, MD  Attending physician: Gwenyth Bender, MD  Chief Complaint:  " I burn and hurt when I pee"   History of Present Illness: [name] is a 31 y.o. female. With history of hemoglobin SS disease originally called  requesting to be seen for what she said was " going into sickle cell crisis from what she feels is from a UTI" requesting treatment limited.  She notes that her pain medication was not working however. Her pain increased to 8/10. She denies increased shortness of breath. There's been no chest pains or abdominal pain but bilateral CVA tenderness.   Meds: Prescriptions prior to admission  Medication Sig Dispense Refill  . cholecalciferol (VITAMIN D) 1000 UNITS tablet Take 1,000 Units by mouth daily.      . folic acid (FOLVITE) 1 MG tablet Take 1 mg by mouth daily.        . hydroxyurea (HYDREA) 500 MG capsule Take 500-1,000 mg by mouth as directed. Take 500mg  (1 tablet) ON ODD DAYS OF THE MONTH AND 1000 MG (2) TABLETS ON THE EVEN DAYS.      Marland Kitchen oxycodone (OXY-IR) 5 MG capsule Take 2 capsules (10 mg total) by mouth every 4 (four) hours as needed. For pain  84 capsule  0  . oxyCODONE (OXYCONTIN) 10 MG 12 hr tablet Take 10 mg by mouth every 12 (twelve) hours.      . cetirizine (ZYRTEC) 10 MG tablet Take 10 mg by mouth daily as needed. For allergies         Allergies: Desferal; Latex; Lisinopril; Tape; and Tylenol Past Medical History  Diagnosis Date  . Sickle cell disease   . Sickle cell disease, type S   . Blood transfusion   . 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 Cholecystectomy   .  left knee acl reconstruction   . Cesarean section     x 2  . Portacath placement    No family history on file. History   Social History  .  Marital Status: Single    Spouse Name: N/A    Number of Children: N/A  . Years of Education: N/A   Occupational History  . Not on file.   Social History Main Topics  . Smoking status: Never Smoker   . Smokeless tobacco: Not on file  . Alcohol Use: No  . Drug Use: No  . Sexually Active: Not Currently    Birth Control/ Protection: IUD   Other Topics Concern  . Not on file   Social History Narrative  . No narrative on file    Review of Systems: Pertinent items are noted in HPI.  Physical Exam:  GENERAL: Well-developed well-nourished black female in no acute distress.  HEENT: No sclera to rest. No sinus tenderness.  NECK: No posterior cervical nodes.  LUNGS: Clear to auscultation. Bilateral CVA tenderness.  CARDIOVASCULAR: Normal S1, S2 without S3.  ABDOMEN: Distended soft .No tenderness.  MUSCLE SKELETAL Negative Homans. No edema.  NEURO: Appropriate affect     Lab results: Results for orders placed during the hospital encounter of 05/20/12 (from the past 24 hour(s))  URINALYSIS, ROUTINE W REFLEX MICROSCOPIC     Status: Abnormal   Collection Time   05/20/12 10:17 AM  Component Value Range   Color, Urine AMBER (*) YELLOW   APPearance CLEAR  CLEAR   Specific Gravity, Urine 1.016  1.005 - 1.030   pH 6.0  5.0 - 8.0   Glucose, UA NEGATIVE  NEGATIVE mg/dL   Hgb urine dipstick MODERATE (*) NEGATIVE   Bilirubin Urine NEGATIVE  NEGATIVE   Ketones, ur NEGATIVE  NEGATIVE mg/dL   Protein, ur >161 (*) NEGATIVE mg/dL   Urobilinogen, UA 1.0  0.0 - 1.0 mg/dL   Nitrite POSITIVE (*) NEGATIVE   Leukocytes, UA SMALL (*) NEGATIVE  URINE MICROSCOPIC-ADD ON     Status: Abnormal   Collection Time   05/20/12 10:17 AM      Component Value Range   Squamous Epithelial / LPF FEW (*) RARE   WBC, UA 3-6  <3 WBC/hpf   Bacteria, UA FEW (*) RARE  COMPREHENSIVE METABOLIC PANEL     Status: Abnormal   Collection Time   05/20/12 10:35 AM      Component Value Range   Sodium 137  135 - 145  mEq/L   Potassium 3.8  3.5 - 5.1 mEq/L   Chloride 102  96 - 112 mEq/L   CO2 25  19 - 32 mEq/L   Glucose, Bld 80  70 - 99 mg/dL   BUN 8  6 - 23 mg/dL   Creatinine, Ser 0.96  0.50 - 1.10 mg/dL   Calcium 9.4  8.4 - 04.5 mg/dL   Total Protein 7.7  6.0 - 8.3 g/dL   Albumin 3.7  3.5 - 5.2 g/dL   AST 65 (*) 0 - 37 U/L   ALT 53 (*) 0 - 35 U/L   Alkaline Phosphatase 193 (*) 39 - 117 U/L   Total Bilirubin 4.1 (*) 0.3 - 1.2 mg/dL   GFR calc non Af Amer >90  >90 mL/min   GFR calc Af Amer >90  >90 mL/min  RETICULOCYTES     Status: Abnormal   Collection Time   05/20/12 10:35 AM      Component Value Range   Retic Ct Pct 17.4 (*) 0.4 - 3.1 %   RBC. 3.09 (*) 3.87 - 5.11 MIL/uL   Retic Count, Manual 537.7 (*) 19.0 - 186.0 K/uL  CBC WITH DIFFERENTIAL     Status: Abnormal   Collection Time   05/20/12 10:35 AM      Component Value Range   WBC 6.5  4.0 - 10.5 K/uL   RBC 3.09 (*) 3.87 - 5.11 MIL/uL   Hemoglobin 9.9 (*) 12.0 - 15.0 g/dL   HCT 40.9 (*) 81.1 - 91.4 %   MCV 92.9  78.0 - 100.0 fL   MCH 32.0  26.0 - 34.0 pg   MCHC 34.5  30.0 - 36.0 g/dL   RDW 78.2 (*) 95.6 - 21.3 %   Platelets 395  150 - 400 K/uL   Neutrophils Relative 58  43 - 77 %   Lymphocytes Relative 29  12 - 46 %   Monocytes Relative 12  3 - 12 %   Eosinophils Relative 1  0 - 5 %   Basophils Relative 0  0 - 1 %   Neutro Abs 3.7  1.7 - 7.7 K/uL   Lymphs Abs 1.9  0.7 - 4.0 K/uL   Monocytes Absolute 0.8  0.1 - 1.0 K/uL   Eosinophils Absolute 0.1  0.0 - 0.7 K/uL   Basophils Absolute 0.0  0.0 - 0.1 K/uL   RBC Morphology RARE NRBCs  Smear Review LARGE PLATELETS PRESENT      Imaging results:  No results found.   Assessment & Plan: Probable UTI- will get an U/A  And  C&S start Cipro prophylactic and review results when available  Sickle cell Crisis: secondary to ??UTI,  Acute on Chronic pain: IM pain management ( no IV access)   Tedrick Port P 05/20/2012, 1:22 PM

## 2012-05-20 NOTE — Progress Notes (Signed)
Patient to be discharged home per NP order. Discharge orders and prescriptions given. Patient states understanding and to F/U with NP concerning patient request to have blood exchange. Patient A&O, ambulatory, no s/s of distress. Escorted self out with visitor.

## 2012-05-20 NOTE — Telephone Encounter (Signed)
05/20/12 1343 Nursing note- At approximately 0900, patient called c/o "think i have a UTI", c/o burning with urination and dark urine, thinks is starting a sickle cell crisis, pain to legs and arms. Pt. Denies SOB and chest pain. NP on call notified. NP called patient back with instructions. Kathlyn Sacramento RN

## 2012-05-20 NOTE — Discharge Summary (Signed)
[facility] Discharge Summary   Patient ID: Dawn [name] MRN: [id number] DOB/AGE: 1981-09-24 31 y.o.  Admit date: 05/20/2012 Discharge date: 05/20/2012  Primary Care Physician:  Willey Blade, MD  Admission Diagnoses:  Active Problems: Sickle cell Crisis with active hemolysis Acute/Chronic pain UTI  Discharge Diagnoses:   Sickle cell Crisis with active hemolysis Acute/Chronic pain UTI   Discharge Medications:  Medication List  As of 05/20/2012  1:22 PM   ASK your doctor about these medications         cetirizine 10 MG tablet   Commonly known as: ZYRTEC   Take 10 mg by mouth daily as needed. For allergies      cholecalciferol 1000 UNITS tablet   Commonly known as: VITAMIN D   Take 1,000 Units by mouth daily.      folic acid 1 MG tablet   Commonly known as: FOLVITE   Take 1 mg by mouth daily.      hydroxyurea 500 MG capsule   Commonly known as: HYDREA   Take 500-1,000 mg by mouth as directed. Take 500mg  (1 tablet) ON ODD DAYS OF THE MONTH AND 1000 MG (2) TABLETS ON THE EVEN DAYS.      oxycodone 5 MG capsule   Commonly known as: OXY-IR   Take 2 capsules (10 mg total) by mouth every 4 (four) hours as needed. For pain      oxyCODONE 10 MG 12 hr tablet   Commonly known as: OXYCONTIN   Take 10 mg by mouth every 12 (twelve) hours.             Consults:  NONE Significant Diagnostic Studies:  No results found.   [facility] Course:  For complete details please refer to admission H and P, but in brief,Dawn [name] was treated for an UTI and sickle cell crisis  potentated by infection. She received po abt's script of Cipro to start when she returned home. Unable to assess IV site no veins. Patient planned a very short admission she has to leave to pick her son up from school bus by 2:00. She agreed to take all her medication po or IM. At the time of d/c she was able to mange her pain at home with her current medication regiment.  Physical Exam at  Discharge:  BP 125/65  Pulse 96  Temp 98.8 F (37.1 C) (Oral)  Resp 18  Ht 5\' 3"  (1.6 m)  Wt 71.668 kg (158 lb)  BMI 27.99 kg/m2  SpO2 97%  GENERAL: Well-developed well-nourished black female in no acute distress.  LUNGS: Clear to auscultation.   CARDIOVASCULAR: Normal S1, S2 without S3.  ABDOMEN: Distended soft bowel sounds present  No tenderness.  Muscle SKELETAL: Negative Homans. No edema. Bilateral CVA tenderness.  NEURO: Intact.   Disposition at Discharge: -Home and Self Care  Discharge Orders: Cipro take until GONE   Condition at Discharge:   Stable  Time spent on Discharge:  Greater than 30 minutes.  Signed: Jamonta Goerner P 05/20/2012, 1:22 PM

## 2012-05-22 LAB — URINE CULTURE: Colony Count: >=100000

## 2012-05-25 ENCOUNTER — Other Ambulatory Visit (HOSPITAL_COMMUNITY): Payer: Self-pay

## 2012-06-09 DIAGNOSIS — Z331 Pregnant state, incidental: Secondary | ICD-10-CM | POA: Diagnosis not present

## 2012-06-09 DIAGNOSIS — D571 Sickle-cell disease without crisis: Secondary | ICD-10-CM | POA: Diagnosis not present

## 2012-07-05 ENCOUNTER — Telehealth (HOSPITAL_COMMUNITY): Payer: Self-pay

## 2012-07-05 ENCOUNTER — Inpatient Hospital Stay (HOSPITAL_COMMUNITY)
Admission: AD | Admit: 2012-07-05 | Discharge: 2012-07-21 | DRG: 812 | Disposition: A | Discharge status: Home / Self Care | Payer: Medicare Other | Attending: Internal Medicine | Admitting: Internal Medicine

## 2012-07-05 ENCOUNTER — Encounter (HOSPITAL_COMMUNITY): Payer: Self-pay

## 2012-07-05 DIAGNOSIS — I10 Essential (primary) hypertension: Secondary | ICD-10-CM

## 2012-07-05 DIAGNOSIS — R609 Edema, unspecified: Secondary | ICD-10-CM | POA: Diagnosis present

## 2012-07-05 DIAGNOSIS — D5700 Hb-SS disease with crisis, unspecified: Principal | ICD-10-CM | POA: Diagnosis present

## 2012-07-05 DIAGNOSIS — R809 Proteinuria, unspecified: Secondary | ICD-10-CM | POA: Diagnosis present

## 2012-07-05 DIAGNOSIS — E876 Hypokalemia: Secondary | ICD-10-CM

## 2012-07-05 DIAGNOSIS — G894 Chronic pain syndrome: Secondary | ICD-10-CM | POA: Diagnosis present

## 2012-07-05 DIAGNOSIS — D72829 Elevated white blood cell count, unspecified: Secondary | ICD-10-CM

## 2012-07-05 DIAGNOSIS — F411 Generalized anxiety disorder: Secondary | ICD-10-CM | POA: Diagnosis present

## 2012-07-05 DIAGNOSIS — N390 Urinary tract infection, site not specified: Secondary | ICD-10-CM | POA: Diagnosis present

## 2012-07-05 DIAGNOSIS — E46 Unspecified protein-calorie malnutrition: Secondary | ICD-10-CM | POA: Diagnosis present

## 2012-07-05 DIAGNOSIS — R509 Fever, unspecified: Secondary | ICD-10-CM

## 2012-07-05 DIAGNOSIS — J40 Bronchitis, not specified as acute or chronic: Secondary | ICD-10-CM | POA: Diagnosis present

## 2012-07-05 DIAGNOSIS — E8809 Other disorders of plasma-protein metabolism, not elsewhere classified: Secondary | ICD-10-CM

## 2012-07-05 DIAGNOSIS — E559 Vitamin D deficiency, unspecified: Secondary | ICD-10-CM | POA: Diagnosis present

## 2012-07-05 LAB — CBC WITH DIFFERENTIAL/PLATELET
Basophils Absolute: 0 10*3/uL (ref 0.0–0.1)
Basophils Relative: 0 % (ref 0–1)
Eosinophils Absolute: 0.1 10*3/uL (ref 0.0–0.7)
Eosinophils Relative: 1 % (ref 0–5)
HCT: 25.1 % — ABNORMAL LOW (ref 36.0–46.0)
Hemoglobin: 9 g/dL — ABNORMAL LOW (ref 12.0–15.0)
Lymphocytes Relative: 30 % (ref 12–46)
Lymphs Abs: 3 10*3/uL (ref 0.7–4.0)
MCH: 34 pg (ref 26.0–34.0)
MCHC: 35.9 g/dL (ref 30.0–36.0)
MCV: 94.7 fL (ref 78.0–100.0)
Monocytes Absolute: 1.4 10*3/uL — ABNORMAL HIGH (ref 0.1–1.0)
Monocytes Relative: 14 % — ABNORMAL HIGH (ref 3–12)
Neutro Abs: 5.5 10*3/uL (ref 1.7–7.7)
Neutrophils Relative %: 55 % (ref 43–77)
Platelets: 261 10*3/uL (ref 150–400)
RBC: 2.65 MIL/uL — ABNORMAL LOW (ref 3.87–5.11)
RDW: 24.5 % — ABNORMAL HIGH (ref 11.5–15.5)
WBC: 10 10*3/uL (ref 4.0–10.5)

## 2012-07-05 LAB — COMPREHENSIVE METABOLIC PANEL
ALT: 46 U/L — ABNORMAL HIGH (ref 0–35)
AST: 82 U/L — ABNORMAL HIGH (ref 0–37)
Albumin: 3.6 g/dL (ref 3.5–5.2)
Alkaline Phosphatase: 174 U/L — ABNORMAL HIGH (ref 39–117)
BUN: 6 mg/dL (ref 6–23)
CO2: 23 mEq/L (ref 19–32)
Calcium: 9.1 mg/dL (ref 8.4–10.5)
Chloride: 102 mEq/L (ref 96–112)
Creatinine, Ser: 0.53 mg/dL (ref 0.50–1.10)
GFR calc Af Amer: >90 mL/min (ref >90)
GFR calc non Af Amer: >90 mL/min (ref >90)
Glucose, Bld: 80 mg/dL (ref 70–99)
Potassium: 3.9 mEq/L (ref 3.5–5.1)
Sodium: 135 mEq/L (ref 135–145)
Total Bilirubin: 5.9 mg/dL — ABNORMAL HIGH (ref 0.3–1.2)
Total Protein: 7.7 g/dL (ref 6.0–8.3)

## 2012-07-05 LAB — RETICULOCYTES
RBC.: 2.65 MIL/uL — ABNORMAL LOW (ref 3.87–5.11)
Retic Count, Absolute: 969.9 10*3/uL — ABNORMAL HIGH (ref 19.0–186.0)
Retic Ct Pct: 36.6 % — ABNORMAL HIGH (ref 0.4–3.1)

## 2012-07-05 MED ORDER — KETOROLAC TROMETHAMINE 30 MG/ML IJ SOLN
30.0000 mg | Freq: Four times a day (QID) | INTRAMUSCULAR | Status: AC | PRN
  Administered 2012-07-06 – 2012-07-10 (×3): 30 mg via INTRAVENOUS
  Filled 2012-07-05: qty 2
  Filled 2012-07-05 (×3): qty 1

## 2012-07-05 MED ORDER — OXYCODONE HCL 10 MG PO TB12
10.0000 mg | ORAL_TABLET | Freq: Two times a day (BID) | ORAL | Status: DC
  Administered 2012-07-06 – 2012-07-09 (×7): 10 mg via ORAL
  Filled 2012-07-05 (×25): qty 1

## 2012-07-05 MED ORDER — VITAMIN D3 25 MCG (1000 UNIT) PO TABS
1000.0000 [IU] | ORAL_TABLET | Freq: Every day | ORAL | Status: DC
  Administered 2012-07-06 – 2012-07-11 (×6): 1000 [IU] via ORAL
  Filled 2012-07-05 (×6): qty 1

## 2012-07-05 MED ORDER — DIPHENHYDRAMINE HCL 50 MG/ML IJ SOLN
12.5000 mg | INTRAMUSCULAR | Status: DC | PRN
  Administered 2012-07-05 – 2012-07-06 (×3): 25 mg via INTRAVENOUS
  Administered 2012-07-06: 12.5 mg via INTRAVENOUS
  Administered 2012-07-06: 25 mg via INTRAVENOUS
  Administered 2012-07-07: 12.5 mg via INTRAVENOUS
  Administered 2012-07-07 – 2012-07-13 (×10): 25 mg via INTRAVENOUS
  Administered 2012-07-14: 12.5 mg via INTRAVENOUS
  Administered 2012-07-14 (×2): 25 mg via INTRAVENOUS
  Administered 2012-07-14: 12.5 mg via INTRAVENOUS
  Administered 2012-07-15 – 2012-07-21 (×19): 25 mg via INTRAVENOUS
  Filled 2012-07-05 (×38): qty 1

## 2012-07-05 MED ORDER — DIPHENHYDRAMINE HCL 25 MG PO CAPS
25.0000 mg | ORAL_CAPSULE | ORAL | Status: DC | PRN
  Administered 2012-07-17: 25 mg via ORAL
  Filled 2012-07-05: qty 1

## 2012-07-05 MED ORDER — DEXTROSE-NACL 5-0.45 % IV SOLN
INTRAVENOUS | Status: DC
  Administered 2012-07-05: 15:00:00 via INTRAVENOUS
  Administered 2012-07-06: 1000 mL via INTRAVENOUS
  Administered 2012-07-06 – 2012-07-10 (×10): via INTRAVENOUS

## 2012-07-05 MED ORDER — KETOROLAC TROMETHAMINE 60 MG/2ML IM SOLN
60.0000 mg | Freq: Once | INTRAMUSCULAR | Status: AC
  Administered 2012-07-05: 60 mg via INTRAMUSCULAR
  Filled 2012-07-05: qty 2

## 2012-07-05 MED ORDER — LORATADINE 10 MG PO TABS
10.0000 mg | ORAL_TABLET | Freq: Every day | ORAL | Status: DC
  Administered 2012-07-06 – 2012-07-21 (×16): 10 mg via ORAL
  Filled 2012-07-05 (×16): qty 1

## 2012-07-05 MED ORDER — ALPRAZOLAM 0.25 MG PO TABS
0.2500 mg | ORAL_TABLET | Freq: Three times a day (TID) | ORAL | Status: DC | PRN
  Administered 2012-07-05 – 2012-07-17 (×10): 0.25 mg via ORAL
  Filled 2012-07-05 (×10): qty 1

## 2012-07-05 MED ORDER — FOLIC ACID 1 MG PO TABS
1.0000 mg | ORAL_TABLET | Freq: Every day | ORAL | Status: DC
  Administered 2012-07-06 – 2012-07-21 (×16): 1 mg via ORAL
  Filled 2012-07-05 (×16): qty 1

## 2012-07-05 MED ORDER — ONDANSETRON HCL 4 MG PO TABS: 4.0000 mg | ORAL_TABLET | ORAL | Status: DC | PRN

## 2012-07-05 MED ORDER — ONDANSETRON HCL 4 MG/2ML IJ SOLN
4.0000 mg | INTRAMUSCULAR | Status: DC | PRN
  Administered 2012-07-05 – 2012-07-06 (×4): 4 mg via INTRAVENOUS
  Filled 2012-07-05 (×4): qty 2

## 2012-07-05 MED ORDER — HYDROMORPHONE HCL PF 2 MG/ML IJ SOLN
2.0000 mg | INTRAMUSCULAR | Status: DC | PRN
  Administered 2012-07-05: 4 mg via INTRAVENOUS
  Administered 2012-07-05 (×2): 2 mg via INTRAVENOUS
  Administered 2012-07-05 (×2): 4 mg via INTRAVENOUS
  Administered 2012-07-06: 2 mg via INTRAVENOUS
  Administered 2012-07-06: 4 mg via INTRAVENOUS
  Administered 2012-07-06: 2 mg via INTRAVENOUS
  Administered 2012-07-06: 4 mg via INTRAVENOUS
  Administered 2012-07-06: 2 mg via INTRAVENOUS
  Administered 2012-07-06: 4 mg via INTRAVENOUS
  Administered 2012-07-06: 2 mg via INTRAVENOUS
  Administered 2012-07-06 – 2012-07-07 (×4): 4 mg via INTRAVENOUS
  Administered 2012-07-07: 2 mg via INTRAVENOUS
  Administered 2012-07-07: 4 mg via INTRAVENOUS
  Administered 2012-07-07 – 2012-07-08 (×6): 2 mg via INTRAVENOUS
  Administered 2012-07-08: 4 mg via INTRAVENOUS
  Administered 2012-07-08: 2 mg via INTRAVENOUS
  Administered 2012-07-08 (×2): 4 mg via INTRAVENOUS
  Administered 2012-07-08 (×4): 2 mg via INTRAVENOUS
  Administered 2012-07-09 – 2012-07-11 (×19): 4 mg via INTRAVENOUS
  Administered 2012-07-11: 2 mg via INTRAVENOUS
  Administered 2012-07-11: 4 mg via INTRAVENOUS
  Administered 2012-07-11: 2 mg via INTRAVENOUS
  Filled 2012-07-05 (×6): qty 1
  Filled 2012-07-05 (×4): qty 2
  Filled 2012-07-05: qty 1
  Filled 2012-07-05 (×3): qty 2
  Filled 2012-07-05: qty 1
  Filled 2012-07-05 (×2): qty 2
  Filled 2012-07-05: qty 1
  Filled 2012-07-05 (×3): qty 2
  Filled 2012-07-05 (×3): qty 1
  Filled 2012-07-05 (×4): qty 2
  Filled 2012-07-05: qty 1
  Filled 2012-07-05 (×8): qty 2
  Filled 2012-07-05: qty 1
  Filled 2012-07-05: qty 2
  Filled 2012-07-05: qty 1
  Filled 2012-07-05 (×8): qty 2
  Filled 2012-07-05 (×3): qty 1
  Filled 2012-07-05: qty 2
  Filled 2012-07-05: qty 1
  Filled 2012-07-05: qty 2

## 2012-07-05 NOTE — H&P (Signed)
[facility] History and Physical   Date: 07/05/2012  Patient name: Dawn [name] Medical record number: [id number] Date of birth: May 01, 1981 Age: 31 y.o. Gender: female PCP: August Saucer ERIC, MD  Attending physician: Gwenyth Bender, MD  Chief Complaint: Throbbing pain in her thighs down to ankles    History of Present Illness: Dawn [name] is a 31 y.o. African American  Female, with history of hemoglobin SS disease, HTN, elevated LFT's hypokalemia, hypoalbuminemia and depression reactive. She called the [facility] this am with complaints pain  In her arms thighs and LE not relieved by home pain medications .with diffuse arthralgias. She has an intermittent cough with questionable sinus congestion. Her pain increased to 8/10. She is requesting treatment. She denies increased shortness of breath. There's been no chest pains or abdominal pain.  Meds: Prescriptions prior to admission  Medication Sig Dispense Refill  . cetirizine (ZYRTEC) 10 MG tablet Take 10 mg by mouth daily as needed. For allergies       . cholecalciferol (VITAMIN D) 1000 UNITS tablet Take 1,000 Units by mouth daily.      . folic acid (FOLVITE) 1 MG tablet Take 1 mg by mouth daily.        . hydroxyurea (HYDREA) 500 MG capsule Take 500-1,000 mg by mouth as directed. Take 500mg  (1 tablet) ON ODD DAYS OF THE MONTH AND 1000 MG (2) TABLETS ON THE EVEN DAYS.      Marland Kitchen oxycodone (OXY-IR) 5 MG capsule Take 2 capsules (10 mg total) by mouth every 4 (four) hours as needed. For pain  84 capsule  0  . oxyCODONE (OXYCONTIN) 10 MG 12 hr tablet Take 10 mg by mouth every 12 (twelve) hours.        Allergies: Desferal; Latex; Lisinopril; Tape; and Tylenol Past Medical History  Diagnosis Date  . Sickle cell disease   . Sickle cell disease, type S   . Blood transfusion   . 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 Cholecystectomy   .  left knee acl reconstruction   . Cesarean section     x 2    . Portacath placement    History reviewed. No pertinent family history. History   Social History  . Marital Status: Single    Spouse Name: N/A    Number of Children: N/A  . Years of Education: N/A   Occupational History  . Not on file.   Social History Main Topics  . Smoking status: Never Smoker   . Smokeless tobacco: Not on file  . Alcohol Use: No  . Drug Use: No  . Sexually Active: Not Currently    Birth Control/ Protection: IUD   Other Topics Concern  . Not on file   Social History Narrative  . No narrative on file    Review of Systems: Pertinent items are noted in HPI.  Physical Exam: Blood pressure 122/78, pulse 100, temperature 98.6 F (37 C), temperature source Oral, resp. rate 18, SpO2 98.00%.  General appearance: Alert, cooperative, well nourished, well developed, no apparent distress  Head: Normocephalic, without obvious abnormality, atraumatic  Eyes: PERRLA, EOMI, mild scleral icterus  Nose: Nares, septum and mucosa are normal, no sinus tenderness  Throat: Lips, mucosa, and tongue are normal; teeth and gums are normal  Neck: No adenopathy, supple, symmetrical, trachea midline and thyroid not enlarged, symmetric, no tenderness/mass/nodules  Back: Symmetric, no curvature, improved ROM, no CVA tenderness, diffuse lower/middle tenderness  Cardio: Regular rate and rhythm, S1, S2 normal, no murmur/click/rub/gallop  GI: Soft, non-tender, distended, hypoactive bowel sounds, no organomegaly  Extremities: Extremities normal, atraumatic, no cyanosis or edema, tender bilateral LEs  Pulses: 2+ and symmetric  Skin: Skin color, texture, turgor normal, no rashes or lesions, tattoos  Neurologic: Grossly normal, AO x 3, no focal deficits, CN II - XII intact  Psych: Appropriate affect     Lab results: No results found for this or any previous visit (from the past 24 hour(s)).  Imaging results:  No results found.   Assessment & Plan: Patient Active Hospital  Problem List:  Sickle cell crisis:  with active hemolysis t. Bili 5.9 and  retic count manual  969.9  and  Count pot 36  Evidence of active crisis . HGB 9.0 and HCT 25.1  The patient will receive blood exchange x 1 once blood is ready due to her antibodies there will be a delay in tx.   She will  Receive pain/nausea/pruritis management. The patient is also receiving her home medications and IV hydration. CMP and CBC in the am.  Anemia: secondary to SCD-  HGB 9.0 and HCT 25.1 monitor  Anxiety: increase tension and nervousness home stressors parents caring for son while in [facility]. Short course of xanax .25 mg  Acute on chronic pain minimal relief from IV hydromorphone add 1 x dose of 60 mg IM now than 30mg  IV prn for an adjunct to current pain medication Nausea: Zofran antiemetic prn  Elevated LFT's: secondary to sickle cell crisis will r/o other causative factors LDL and hepatitis panel    Juda Toepfer P 07/05/2012, 10:41 PM

## 2012-07-06 ENCOUNTER — Inpatient Hospital Stay (HOSPITAL_COMMUNITY): Payer: Medicare Other

## 2012-07-06 DIAGNOSIS — E876 Hypokalemia: Secondary | ICD-10-CM | POA: Diagnosis not present

## 2012-07-06 DIAGNOSIS — D5700 Hb-SS disease with crisis, unspecified: Secondary | ICD-10-CM | POA: Diagnosis not present

## 2012-07-06 DIAGNOSIS — F329 Major depressive disorder, single episode, unspecified: Secondary | ICD-10-CM | POA: Diagnosis not present

## 2012-07-06 DIAGNOSIS — R05 Cough: Secondary | ICD-10-CM | POA: Diagnosis not present

## 2012-07-06 DIAGNOSIS — J40 Bronchitis, not specified as acute or chronic: Secondary | ICD-10-CM | POA: Diagnosis present

## 2012-07-06 DIAGNOSIS — E559 Vitamin D deficiency, unspecified: Secondary | ICD-10-CM | POA: Diagnosis present

## 2012-07-06 DIAGNOSIS — M7989 Other specified soft tissue disorders: Secondary | ICD-10-CM | POA: Diagnosis not present

## 2012-07-06 DIAGNOSIS — I10 Essential (primary) hypertension: Secondary | ICD-10-CM | POA: Diagnosis not present

## 2012-07-06 DIAGNOSIS — R609 Edema, unspecified: Secondary | ICD-10-CM | POA: Diagnosis present

## 2012-07-06 DIAGNOSIS — R109 Unspecified abdominal pain: Secondary | ICD-10-CM | POA: Diagnosis not present

## 2012-07-06 DIAGNOSIS — G894 Chronic pain syndrome: Secondary | ICD-10-CM | POA: Diagnosis present

## 2012-07-06 DIAGNOSIS — M79609 Pain in unspecified limb: Secondary | ICD-10-CM | POA: Diagnosis not present

## 2012-07-06 DIAGNOSIS — R809 Proteinuria, unspecified: Secondary | ICD-10-CM | POA: Diagnosis present

## 2012-07-06 DIAGNOSIS — R112 Nausea with vomiting, unspecified: Secondary | ICD-10-CM | POA: Diagnosis not present

## 2012-07-06 DIAGNOSIS — E46 Unspecified protein-calorie malnutrition: Secondary | ICD-10-CM | POA: Diagnosis present

## 2012-07-06 DIAGNOSIS — F411 Generalized anxiety disorder: Secondary | ICD-10-CM | POA: Diagnosis present

## 2012-07-06 DIAGNOSIS — N390 Urinary tract infection, site not specified: Secondary | ICD-10-CM | POA: Diagnosis present

## 2012-07-06 DIAGNOSIS — M545 Low back pain: Secondary | ICD-10-CM | POA: Diagnosis not present

## 2012-07-06 LAB — PREPARE RBC (CROSSMATCH)

## 2012-07-06 LAB — CBC WITH DIFFERENTIAL/PLATELET
Basophils Relative: 0 % (ref 0–1)
Eosinophils Relative: 1 % (ref 0–5)
Hemoglobin: 7 g/dL — ABNORMAL LOW (ref 12.0–15.0)
MCH: 35.2 pg — ABNORMAL HIGH (ref 26.0–34.0)
Monocytes Absolute: 1.6 10*3/uL — ABNORMAL HIGH (ref 0.1–1.0)
Neutrophils Relative %: 66 % (ref 43–77)
RBC: 1.99 MIL/uL — ABNORMAL LOW (ref 3.87–5.11)

## 2012-07-06 LAB — COMPREHENSIVE METABOLIC PANEL
ALT: 45 U/L — ABNORMAL HIGH (ref 0–35)
Alkaline Phosphatase: 150 U/L — ABNORMAL HIGH (ref 39–117)
CO2: 24 mEq/L (ref 19–32)
Calcium: 8.2 mg/dL — ABNORMAL LOW (ref 8.4–10.5)
GFR calc Af Amer: >90 mL/min (ref >90)
GFR calc non Af Amer: >90 mL/min (ref >90)
Glucose, Bld: 110 mg/dL — ABNORMAL HIGH (ref 70–99)
Potassium: 4 mEq/L (ref 3.5–5.1)
Sodium: 135 mEq/L (ref 135–145)

## 2012-07-06 MED ORDER — ACETAMINOPHEN 325 MG PO TABS
ORAL_TABLET | ORAL | Status: AC
  Filled 2012-07-06: qty 2

## 2012-07-06 MED ORDER — ENOXAPARIN SODIUM 40 MG/0.4ML ~~LOC~~ SOLN
40.0000 mg | SUBCUTANEOUS | Status: DC
  Administered 2012-07-07 – 2012-07-20 (×14): 40 mg via SUBCUTANEOUS
  Filled 2012-07-06 (×16): qty 0.4

## 2012-07-06 MED ORDER — IBUPROFEN 400 MG PO TABS
400.0000 mg | ORAL_TABLET | Freq: Four times a day (QID) | ORAL | Status: DC | PRN
  Administered 2012-07-06 – 2012-07-17 (×3): 400 mg via ORAL
  Filled 2012-07-06 (×3): qty 1

## 2012-07-06 MED ORDER — ENOXAPARIN SODIUM 40 MG/0.4ML ~~LOC~~ SOLN
40.0000 mg | SUBCUTANEOUS | Status: DC
  Filled 2012-07-06: qty 0.4

## 2012-07-06 MED ORDER — PROMETHAZINE HCL 25 MG/ML IJ SOLN
25.0000 mg | INTRAMUSCULAR | Status: DC | PRN
  Administered 2012-07-06 – 2012-07-20 (×45): 25 mg via INTRAVENOUS
  Filled 2012-07-06 (×47): qty 1

## 2012-07-06 MED ORDER — PNEUMOCOCCAL VAC POLYVALENT 25 MCG/0.5ML IJ INJ
0.5000 mL | INJECTION | INTRAMUSCULAR | Status: AC
  Administered 2012-07-07: 0.5 mL via INTRAMUSCULAR
  Filled 2012-07-06: qty 0.5

## 2012-07-06 MED ORDER — INFLUENZA VIRUS VACC SPLIT PF IM SUSP
0.5000 mL | INTRAMUSCULAR | Status: AC
  Administered 2012-07-07: 0.5 mL via INTRAMUSCULAR
  Filled 2012-07-06: qty 0.5

## 2012-07-06 NOTE — Discharge Summary (Signed)
[facility] Discharge Summary   Patient ID: GENEVIE ELMAN MRN: [id number] DOB/AGE: 1981/08/22 31 y.o.  Admit date: 07/05/2012 Discharge date: 07/06/2012  Primary Care Physician:  Willey Blade, MD  Admission Diagnoses:  Active Problems:  Sickle cell crisis  Acute on chronic pain  Anxiety   Discharge Diagnoses and Admitting Diagnoses  Sickle cell crisis with active hemolysis  Sickle cell crisis  Acute on chronic pain  Anxiety   Discharge Medications:    Medication List     As of 07/06/2012 12:42 PM    ASK your doctor about these medications         cetirizine 10 MG tablet   Commonly known as: ZYRTEC   Take 10 mg by mouth daily as needed. For allergies      cholecalciferol 1000 UNITS tablet   Commonly known as: VITAMIN D   Take 1,000 Units by mouth daily.      folic acid 1 MG tablet   Commonly known as: FOLVITE   Take 1 mg by mouth daily.      hydroxyurea 500 MG capsule   Commonly known as: HYDREA   Take 500-1,000 mg by mouth as directed. Take 500mg  (1 tablet) ON ODD DAYS OF THE MONTH AND 1000 MG (2) TABLETS ON THE EVEN DAYS.      oxycodone 5 MG capsule   Commonly known as: OXY-IR   Take 2 capsules (10 mg total) by mouth every 4 (four) hours as needed. For pain      oxyCODONE 10 MG 12 hr tablet   Commonly known as: OXYCONTIN   Take 10 mg by mouth every 12 (twelve) hours.         Consults:  None  Significant Diagnostic Studies:  No results found.   [facility] Course:  For complete details please refer to admission H and P, but in brief, Ms. Cobbs was originally admitted to the [facility] for 23 observation for Sickle cell crisis.  She was hydrated with IV fluids provided pain antiemetic and antipruitics management. This morning she had several episodes of nausea and vomiting her Zofran d/c and phenergan give with relief. She denies any constipation or causative factors. Note also denies chest pain and shortness of breath.  Unable to  manage her pain during 23 observation despite additional medications used and as evidence of active crisis she will benefit from a blood exchange and transfusions will transfer and admit to in patient service.   Physical Exam at Discharge:  BP 121/67  Pulse 108  Temp 97.4 F (36.3 C) (Oral)  Resp 16  SpO2 95%  General appearance: Alert, cooperative, well nourished, well developed, no apparent distress  Cardio: Regular rate and rhythm, S1, S2 normal, no murmur/click/rub/gallop  GI: Soft, non-tender, distended, hypoactive bowel sounds, no organomegaly  Respiratory: Clear to auscultation bilaterally, respirations unlabored  Extremities: Extremities normal, atraumatic, no cyanosis or edema, tender bilateral thighs and  LEs  Pulses: 2+ and symmetric  Psych: Appropriate affect   Disposition at Discharge: Stable to transfer to in patient service    Condition at Discharge:   Stable  Signed: EDWARDS, MICHELLE P 07/06/2012, 12:42 PM

## 2012-07-06 NOTE — Progress Notes (Signed)
Patient's admission temperature was 102.1, gave incentive spirometer and applied ice packs. Notified Dr. August Saucer. Will continue to monitor throughout shift.

## 2012-07-06 NOTE — Progress Notes (Signed)
Pt discharged to [address] at [facility]. Pt left floor via wheel chair,  Accompanied  By admission associate.

## 2012-07-06 NOTE — Progress Notes (Signed)
Pt vomitted x 2 this am. PO care given. Nausea and vomitting not relieved by Zofran. Gwinda Passe, PA called reguarding  Vomiiting, New orders given. Promethazine 25 mg given in 10 mls of NS over 3 minutes IV push.

## 2012-07-06 NOTE — H&P (Signed)
Patient's PCP: August Saucer ERIC, MD  Chief Complaint: Pain in her arms thighs and lower extremities    History of Present Illness: Dawn [name] is a 31 y.o.  [name], with history of hemoglobin SS disease, HTN, elevated LFT's hypokalemia, hypoalbuminemia and depression reactive. Originally treated in the [facility] for 23 hour observation. She continues to have uncontrolled pain in her arms thighs and LE . Noted she also experienced several episodes  Of nausea with vomiting relieved by phenergan. Her pain is 8/10.  She denies increased shortness of breath. There's been no chest pains or abdominal pain.  Meds: Scheduled Meds:   . acetaminophen      . cholecalciferol  1,000 Units Oral Daily  . enoxaparin  40 mg Subcutaneous Q24H  . folic acid  1 mg Oral Daily  . ketorolac  60 mg Intramuscular Once  . loratadine  10 mg Oral Daily  . oxyCODONE  10 mg Oral Q12H  . pneumococcal 23 valent vaccine  0.5 mL Intramuscular Tomorrow-1000  . DISCONTD: enoxaparin  40 mg Subcutaneous Q24H   Continuous Infusions:   . dextrose 5 % and 0.45% NaCl 125 mL/hr at 07/06/12 1305   PRN Meds:.ALPRAZolam, diphenhydrAMINE, diphenhydrAMINE, HYDROmorphone (DILAUDID) injection, ibuprofen, ketorolac, promethazine, DISCONTD: ondansetron (ZOFRAN) IV, DISCONTD: ondansetron Allergies: Desferal; Latex; Lisinopril; Tape; and Tylenol Past Medical History  Diagnosis Date  . Sickle cell disease   . Sickle cell disease, type S   . Blood transfusion   . 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 Cholecystectomy   .  left knee acl reconstruction   . Cesarean section     x 2  . Portacath placement    History reviewed. No pertinent family history. History   Social History  . Marital Status: Single    Spouse Name: N/A    Number of Children: N/A  . Years of Education: N/A   Occupational History  . Not on file.   Social History Main Topics  . Smoking status:  Never Smoker   . Smokeless tobacco: Not on file  . Alcohol Use: No  . Drug Use: No  . Sexually Active: Not Currently    Birth Control/ Protection: IUD   Other Topics Concern  . Not on file   Social History Narrative  . No narrative on file   Review of Systems: Pertinent items are noted in HPI.  Physical Exam: Blood pressure 124/57, pulse 121, temperature 102.7 F (39.3 C), temperature source Oral, resp. rate 20, height 5\' 3"  (1.6 m), weight 68.947 kg (152 lb), SpO2 93.00%. General appearance: Alert, cooperative, well nourished, well developed, no apparent distress  Head: Normocephalic, without obvious abnormality, atraumatic  Eyes: PERRLA, EOMI, mild scleral icterus  Nose: Nares, septum and mucosa are normal, no sinus tenderness  Throat: Lips, mucosa, and tongue are normal; teeth and gums are normal  Neck: No adenopathy, supple, symmetrical, trachea midline and thyroid not enlarged, symmetric, no tenderness/mass/nodules  Back: Symmetric, no curvature, improved ROM, no CVA tenderness, diffuse lower/middle tenderness  Cardio: Regular rate and rhythm, S1, S2 normal, no murmur/click/rub/gallop  GI: Soft, non-tender, distended, hypoactive bowel sounds, no organomegaly  Extremities: Extremities normal, atraumatic, no cyanosis or edema, tender bilateral LEs  Pulses: 2+ and symmetric  Skin: Skin color, texture, turgor normal, no rashes or lesions, tattoos on her upper back  Neurologic: Grossly normal, AO x 3, no focal deficits, CN II - XII intact  Psych: Appropriate affect   Lab  results:  Basename 07/05/12 1508  NA 135  K 3.9  CL 102  CO2 23  GLUCOSE 80  BUN 6  CREATININE 0.53  CALCIUM 9.1  MG --  PHOS --    Basename 07/05/12 1508  AST 82*  ALT 46*  ALKPHOS 174*  BILITOT 5.9*  PROT 7.7  ALBUMIN 3.6   No results found for this basename: LIPASE:2,AMYLASE:2 in the last 72 hours  Basename 07/05/12 1508  WBC 10.0  NEUTROABS 5.5  HGB 9.0*  HCT 25.1*  MCV 94.7  PLT  261   No results found for this basename: CKTOTAL:3,CKMB:3,CKMBINDEX:3,TROPONINI:3 in the last 72 hours No components found with this basename: POCBNP:3 No results found for this basename: DDIMER in the last 72 hours No results found for this basename: HGBA1C:2 in the last 72 hours No results found for this basename: CHOL:2,HDL:2,LDLCALC:2,TRIG:2,CHOLHDL:2,LDLDIRECT:2 in the last 72 hours No results found for this basename: TSH,T4TOTAL,FREET3,T3FREE,THYROIDAB in the last 72 hours  Basename 07/05/12 1508  VITAMINB12 --  FOLATE --  FERRITIN --  TIBC --  IRON --  RETICCTPCT 36.6*   Imaging results:  No results found. Other results:  Assessment & Plan by Problem: Active Problems:  Sickle cell crisis: with active hemolysis t. Bili 5.9 and retic count manual 969.9 and Count pot 36 Evidence of active crisis . HGB 9.0 and HCT 25.1 The patient will receive blood exchange x 1 once blood is ready due to her antibodies there will be a delay in tx. She will Receive pain/nausea/pruritis management. The patient is also receiving her home medications and IV hydration. CMP and CBC in the am. ( blood was available but spiked a temp on hold ) Anemia: secondary to SCD CBC pending  Intermittent cough: chest x-ray showed no active disease  Abdominal pain- U/S ordered r/o hepatomegaly   Anxiety: increase tension and nervousness  xanax .25 mg  Acute on chronic pain minimal relief from IV hydromorphone added 30mg  IV prn for an adjunct to current pain medication  Nausea: Phenergan  antiemetic prn  Elevated LFT's: secondary to sickle cell crisis will r/o other causative factors LDL and hepatitis panel    [name] P 07/06/2012, 8:34 PM

## 2012-07-06 NOTE — Progress Notes (Addendum)
Patient temperature increased to 102.7 - orders for blood cultures, labs, and U/A  In addition, blood transfusion exchange was placed hold. Ongoing nurse spoke to doctor and was informed since elevated temp Md stated they will hold this order and attempt exchange on 07-07-12.   Recheck temp- 101.0

## 2012-07-06 NOTE — Progress Notes (Signed)
At 1408, report called to [place], RN on [address].  No nausea or vomitting noted since  Promethazine given at 1000.

## 2012-07-06 NOTE — Progress Notes (Signed)
Patient refused abdominal 1 view xray. Made on call aware Lenny Pastel.

## 2012-07-07 ENCOUNTER — Inpatient Hospital Stay (HOSPITAL_COMMUNITY): Payer: Medicare Other

## 2012-07-07 LAB — COMPREHENSIVE METABOLIC PANEL
ALT: 44 U/L — ABNORMAL HIGH (ref 0–35)
Albumin: 2.8 g/dL — ABNORMAL LOW (ref 3.5–5.2)
BUN: 5 mg/dL — ABNORMAL LOW (ref 6–23)
Calcium: 8.2 mg/dL — ABNORMAL LOW (ref 8.4–10.5)
GFR calc Af Amer: >90 mL/min (ref >90)
Glucose, Bld: 95 mg/dL (ref 70–99)
Sodium: 139 mEq/L (ref 135–145)
Total Protein: 6 g/dL (ref 6.0–8.3)

## 2012-07-07 LAB — CBC WITH DIFFERENTIAL/PLATELET
Basophils Relative: 0 % (ref 0–1)
Eosinophils Relative: 1 % (ref 0–5)
HCT: 18.2 % — ABNORMAL LOW (ref 36.0–46.0)
Hemoglobin: 6.9 g/dL — CL (ref 12.0–15.0)
Lymphocytes Relative: 22 % (ref 12–46)
MCV: 92.9 fL (ref 78.0–100.0)
Monocytes Relative: 8 % (ref 3–12)
Neutro Abs: 8.6 10*3/uL — ABNORMAL HIGH (ref 1.7–7.7)
WBC: 12.5 10*3/uL — ABNORMAL HIGH (ref 4.0–10.5)

## 2012-07-07 LAB — URINALYSIS, ROUTINE W REFLEX MICROSCOPIC
Glucose, UA: NEGATIVE mg/dL
Nitrite: NEGATIVE
Specific Gravity, Urine: 1.009 (ref 1.005–1.030)
pH: 6 (ref 5.0–8.0)

## 2012-07-07 MED ORDER — CIPROFLOXACIN HCL 500 MG PO TABS
500.0000 mg | ORAL_TABLET | Freq: Two times a day (BID) | ORAL | Status: AC
  Administered 2012-07-07 – 2012-07-09 (×6): 500 mg via ORAL
  Filled 2012-07-07 (×7): qty 1

## 2012-07-07 NOTE — Progress Notes (Signed)
Subjective: [name] was seen on rounds today. She is sitting up in bed her mother came to visit after putting her son on the bus. She has an FUO discussed tx for UTI and microbiology pending.  She continues to complain of pain in her abdomin, thigh and lower extremities. Rates her pain as 8/10.  Increase stressors at home and in personal life maybe contributed to this crisis. Awaiting results from blood cultures chest x-ray reviewed and discussed no active disease.  Objective: Vital signs in last 24 hours: Filed Vitals:   07/06/12 1921 07/06/12 2220 07/07/12 0530 07/07/12 0928  BP:  130/78 105/62   Pulse:  130 97   Temp: 102.7 F (39.3 C) 101 F (38.3 C) 97.9 F (36.6 C) 99.6 F (37.6 C)  TempSrc: Oral Oral Oral Oral  Resp:  20 18   Height:      Weight:      SpO2:  93% 98%    Weight change:   Intake/Output Summary (Last 24 hours) at 07/07/12 1137 Last data filed at 07/07/12 0530  Gross per 24 hour  Intake   2000 ml  Output    400 ml  Net   1600 ml    Physical Exam:  General appearance: Alert, cooperative, well nourished, well developed, no apparent distress  Head: Normocephalic, without obvious abnormality, atraumatic  Eyes: PERRLA, EOMI, mild scleral icterus  Nose: Nares, septum and mucosa are normal, no sinus tenderness  Throat: Lips, mucosa, and tongue are normal; teeth and gums are normal  Neck: No adenopathy, supple, symmetrical, trachea midline and thyroid not enlarged, symmetric, no tenderness/mass/nodules  Back: Symmetric, no curvature, improved ROM, no CVA tenderness, diffuse lower/middle tenderness  Cardio: Regular rate and rhythm, S1, S2 normal, no murmur/click/rub/gallop  GI: Soft, non-tender, distended, hypoactive bowel sounds, no organomegaly  Extremities: Extremities normal, atraumatic, no cyanosis or edema, tender bilateral LEs  Pulses: 2+ and symmetric  Skin: Skin color, texture, turgor normal, no rashes or lesions, tattoos on her upper back    Neurologic: Grossly normal, AO x 3, no focal deficits, CN II - XII intact  Psych: Appropriate affect   Lab Results:  Basename 07/07/12 0540 07/06/12 2040  NA 139 135  K 3.6 4.0  CL 107 103  CO2 25 24  GLUCOSE 95 110*  BUN 5* 7  CREATININE 0.56 0.70  CALCIUM 8.2* 8.2*  MG -- --  PHOS -- --    Basename 07/07/12 0540 07/06/12 2040  AST 89* 95*  ALT 44* 45*  ALKPHOS 138* 150*  BILITOT 7.4* 7.5*  PROT 6.0 6.3  ALBUMIN 2.8* 2.9*   No results found for this basename: LIPASE:2,AMYLASE:2 in the last 72 hours  Basename 07/07/12 0540 07/06/12 2040  WBC 12.5* 12.5*  NEUTROABS 8.6* 8.3*  HGB 6.9* 7.0*  HCT 18.2* 18.6*  MCV 92.9 93.5  PLT 215 242   No results found for this basename: CKTOTAL:3,CKMB:3,CKMBINDEX:3,TROPONINI:3 in the last 72 hours No components found with this basename: POCBNP:3 No results found for this basename: DDIMER:2 in the last 72 hours No results found for this basename: HGBA1C:2 in the last 72 hours No results found for this basename: CHOL:2,HDL:2,LDLCALC:2,TRIG:2,CHOLHDL:2,LDLDIRECT:2 in the last 72 hours No results found for this basename: TSH,T4TOTAL,FREET3,T3FREE,THYROIDAB in the last 72 hours  Basename 07/05/12 1508  VITAMINB12 --  FOLATE --  FERRITIN --  TIBC --  IRON --  RETICCTPCT 36.6*    Micro Results: No results found for this or any previous visit (from the past  240 hour(s)).  Studies/Results: Dg Chest 2 View  07/06/2012  *RADIOLOGY REPORT*  Clinical Data: Fever, cough and sickle cell crisis  CHEST - 2 VIEW  Comparison: 09/24/2011  Findings: The heart, mediastinum and hila are normal.  The lungs are clear.  No pleural effusion or pneumothorax.  The bony thorax is intact.  IMPRESSION: No active disease of the chest.   Original Report Authenticated By: Domenic Moras, M.D.     Medications: Scheduled Meds:   . acetaminophen      . cholecalciferol  1,000 Units Oral Daily  . enoxaparin  40 mg Subcutaneous Q24H  . folic acid  1 mg  Oral Daily  . influenza  inactive virus vaccine  0.5 mL Intramuscular Tomorrow-1000  . loratadine  10 mg Oral Daily  . oxyCODONE  10 mg Oral Q12H  . pneumococcal 23 valent vaccine  0.5 mL Intramuscular Tomorrow-1000  . DISCONTD: enoxaparin  40 mg Subcutaneous Q24H   Continuous Infusions:   . dextrose 5 % and 0.45% NaCl 125 mL/hr at 07/07/12 1056   PRN Meds:.ALPRAZolam, diphenhydrAMINE, diphenhydrAMINE, HYDROmorphone (DILAUDID) injection, ibuprofen, ketorolac, promethazine  Assessment/Plan: Active Problems: Sickle cell crisis: with active hemolysis t. Bili 5.9 and retic count manual 969.9 and Count pot 36 Evidence of active crisis . HGB 9.0 and HCT 25.1 The patient will receive blood exchange x 1 once blood is ready due to her antibodies there will be a delay in tx. She will Receive pain/nausea/pruritis management. The patient is also receiving her home medications and IV hydration. CMP and CBC in the am. ( blood was available but spiked a temp on hold )  Anemia: secondary to SCD CBC pending  Intermittent cough: chest x-ray showed no active disease  Anxiety: increase tension and nervousness xanax .25 mg  Acute on chronic pain minimal relief from IV hydromorphone added 30mg  IV prn for an adjunct to current pain medication  Nausea: Phenergan antiemetic prn  Elevated LFT's: secondary to sickle cell crisis will r/o other causative factors abdominal ultra sound r/o  organotomegaly    UTI: tx empirically with Cipro 500mg  bid for 7 days  Leukocytosis/ UTI  - Temp 102.7 Questionable on U/A awaiting Urine/Blood culture  Malnutrition with Hypoalbuminemia : albumin 2.8 some protein in urine increase protein in diet and dietary consult    LOS: 2 days  Dawn [name] 07/07/2012, 11:37 AM

## 2012-07-08 LAB — CBC WITH DIFFERENTIAL/PLATELET
Basophils Absolute: 0 10*3/uL (ref 0.0–0.1)
Eosinophils Relative: 1 % (ref 0–5)
MCH: 35.2 pg — ABNORMAL HIGH (ref 26.0–34.0)
Monocytes Absolute: 1 10*3/uL (ref 0.1–1.0)
Neutrophils Relative %: 59 % (ref 43–77)
Platelets: 268 10*3/uL (ref 150–400)
RBC: 2.19 MIL/uL — ABNORMAL LOW (ref 3.87–5.11)
RDW: 27.3 % — ABNORMAL HIGH (ref 11.5–15.5)
WBC: 9.5 10*3/uL (ref 4.0–10.5)

## 2012-07-08 LAB — COMPREHENSIVE METABOLIC PANEL
AST: 101 U/L — ABNORMAL HIGH (ref 0–37)
BUN: 4 mg/dL — ABNORMAL LOW (ref 6–23)
CO2: 26 mEq/L (ref 19–32)
Chloride: 101 mEq/L (ref 96–112)
Creatinine, Ser: 0.59 mg/dL (ref 0.50–1.10)
GFR calc non Af Amer: >90 mL/min (ref >90)
Total Bilirubin: 6.7 mg/dL — ABNORMAL HIGH (ref 0.3–1.2)

## 2012-07-08 LAB — CBC
HCT: 19 % — ABNORMAL LOW (ref 36.0–46.0)
MCH: 35.9 pg — ABNORMAL HIGH (ref 26.0–34.0)
MCV: 96 fL (ref 78.0–100.0)
RBC: 1.98 MIL/uL — ABNORMAL LOW (ref 3.87–5.11)
WBC: 7.3 10*3/uL (ref 4.0–10.5)

## 2012-07-08 MED ORDER — SODIUM CHLORIDE 0.9 % IJ SOLN
10.0000 mL | INTRAMUSCULAR | Status: DC | PRN
  Administered 2012-07-14: 150 mL
  Administered 2012-07-20: 10 mL

## 2012-07-08 MED ORDER — SODIUM CHLORIDE 0.9 % IJ SOLN
10.0000 mL | Freq: Two times a day (BID) | INTRAMUSCULAR | Status: DC
  Administered 2012-07-08: 10 mL
  Administered 2012-07-09: 40 mL
  Administered 2012-07-09 – 2012-07-10 (×2): 10 mL
  Administered 2012-07-10: 40 mL
  Administered 2012-07-11 – 2012-07-16 (×10): 10 mL
  Administered 2012-07-18: 40 mL
  Administered 2012-07-18 – 2012-07-21 (×7): 10 mL

## 2012-07-08 NOTE — Progress Notes (Signed)
Nutrition Brief Note  Patient received consult for malnutrition with hypoalbuminemia.   Body mass index is 26.93 kg/(m^2). Pt meets criteria for overweight based on current BMI.   Current diet order is regular, patient is consuming approximately 75-100% of meals at this time. Labs and medications reviewed. Pt reports good appetite PTA, 2 meals/day plus snacks and stable weight between 148-152 pounds. Pt denies any changes in appetite and has been eating excellent during admission. Low albumin mostly likely r/t inflammatory process of pt's illnesses versus malnutrition.   No nutrition interventions warranted at this time. If nutrition issues arise, please consult RD.   Levon Hedger MS, RD, LDN [phone] Pager [phone] After Hours Pager

## 2012-07-08 NOTE — Progress Notes (Signed)
Peripherally Inserted Central Catheter/Midline Placement  The IV Nurse has discussed with the patient and/or persons authorized to consent for the patient, the purpose of this procedure and the potential benefits and risks involved with this procedure.  The benefits include less needle sticks, lab draws from the catheter and patient may be discharged home with the catheter.  Risks include, but not limited to, infection, bleeding, blood clot (thrombus formation), and puncture of an artery; nerve damage and irregular heat beat.  Alternatives to this procedure were also discussed. Consent obtained by Stacie Glaze RN  PICC/Midline Placement Documentation        Dawn [name], Dawn [name] 07/08/2012, 11:04 AM

## 2012-07-08 NOTE — Progress Notes (Signed)
Subjective: Ms. Shaneece Stockburger was seen on rounds today. She is sitting up in bed her mother came to visit after putting her son on the bus again today.  She remains febrile with a low grade temp intermittent.(awaiting microbiology).  She continues to have pain in her lower , forearms, thigh and lower extremities.Adbominal pain has resolved.  Rates her pain as 7/10.  Increase stressors at home and in personal life maybe contributed to crisis. Awaiting results from blood cultures . Objective: Vital signs in last 24 hours: Filed Vitals:   07/07/12 1833 07/07/12 2110 07/08/12 0207 07/08/12 0542  BP: 114/69 111/50 107/61 111/61  Pulse: 104 64 97 73  Temp: 100.1 F (37.8 C) 98.6 F (37 C) 98.4 F (36.9 C) 98.8 F (37.1 C)  TempSrc: Oral Oral Oral Oral  Resp: 18 18 18 18   Height:      Weight:      SpO2: 95% 94% 94% 100%   Weight change:   Intake/Output Summary (Last 24 hours) at 07/08/12 1031 Last data filed at 07/08/12 0207  Gross per 24 hour  Intake    840 ml  Output      0 ml  Net    840 ml    Physical Exam:  General appearance: Alert, cooperative, well nourished, well developed, no apparent distress  Head: Normocephalic, without obvious abnormality, atraumatic  Eyes: PERRLA, EOMI, mild scleral icterus  Nose: Nares, septum and mucosa are normal, no sinus tenderness  Throat: Lips, mucosa, and tongue are normal; teeth and gums are normal  Neck: No adenopathy, supple, symmetrical, trachea midline and thyroid not enlarged, symmetric, no tenderness/mass/nodules  Back: Symmetric, no curvature, improved ROM,  CVA tenderness, diffuse lower/middle tenderness  Cardio: Regular rate and rhythm, S1, S2 normal, no murmur/click/rub/gallop  GI: Soft, non-tender, distended, hypoactive bowel sounds, no organomegaly  Extremities: Extremities normal, atraumatic, no cyanosis or edema, tender bilateral UE,  LEs  Pulses: 2+ and symmetric  Skin: Skin color, texture, turgor normal, no rashes or lesions,  tattoos on her upper back  Neurologic: Grossly normal, AO x 3, no focal deficits, CN II - XII intact  Psych: Appropriate affect   Lab Results:  [facility] 07/08/12 0355 07/07/12 0540  NA 136 139  K 3.7 3.6  CL 101 107  CO2 26 25  GLUCOSE 105* 95  BUN 4* 5*  CREATININE 0.59 0.56  CALCIUM 8.3* 8.2*  MG -- --  PHOS -- --    Basename 07/08/12 0355 07/07/12 0540  AST 101* 89*  ALT 51* 44*  ALKPHOS 141* 138*  BILITOT 6.7* 7.4*  PROT 6.7 6.0  ALBUMIN 2.9* 2.8*   No results found for this basename: LIPASE:2,AMYLASE:2 in the last 72 hours  Basename 07/08/12 0355 07/07/12 0540  WBC 9.5 12.5*  NEUTROABS 5.6 8.6*  HGB 7.7* 6.9*  HCT 21.2* 18.2*  MCV 96.8 92.9  PLT 268 215   No results found for this basename: CKTOTAL:3,CKMB:3,CKMBINDEX:3,TROPONINI:3 in the last 72 hours No components found with this basename: POCBNP:3 No results found for this basename: DDIMER:2 in the last 72 hours No results found for this basename: HGBA1C:2 in the last 72 hours No results found for this basename: CHOL:2,HDL:2,LDLCALC:2,TRIG:2,CHOLHDL:2,LDLDIRECT:2 in the last 72 hours No results found for this basename: TSH,T4TOTAL,FREET3,T3FREE,THYROIDAB in the last 72 hours  Basename 07/05/12 1508  VITAMINB12 --  FOLATE --  FERRITIN --  TIBC --  IRON --  RETICCTPCT 36.6*    Micro Results: Recent Results (from the past 240 hour(s))  CULTURE,  BLOOD (ROUTINE X 2)     Status: Normal (Preliminary result)   Collection Time   07/06/12  8:40 PM      Component Value Range Status Comment   Specimen Description BLOOD LEFT HAND   Final    Special Requests BOTTLES DRAWN AEROBIC ONLY    Final    Culture  Setup Time 07/07/2012 04:40   Final    Culture     Final    Value:        BLOOD CULTURE RECEIVED NO GROWTH TO DATE CULTURE WILL BE HELD FOR 5 DAYS BEFORE ISSUING A FINAL NEGATIVE REPORT   Report Status PENDING   Incomplete   CULTURE, BLOOD (ROUTINE X 2)     Status: Normal (Preliminary result)    Collection Time   07/06/12  8:47 PM      Component Value Range Status Comment   Specimen Description BLOOD LEFT HAND   Final    Special Requests BOTTLES DRAWN AEROBIC ONLY    Final    Culture  Setup Time 07/07/2012 04:40   Final    Culture     Final    Value:        BLOOD CULTURE RECEIVED NO GROWTH TO DATE CULTURE WILL BE HELD FOR 5 DAYS BEFORE ISSUING A FINAL NEGATIVE REPORT   Report Status PENDING   Incomplete     Studies/Results: Dg Chest 2 View  07/06/2012  *RADIOLOGY REPORT*  Clinical Data: Fever, cough and sickle cell crisis  CHEST - 2 VIEW  Comparison: 09/24/2011  Findings: The heart, mediastinum and hila are normal.  The lungs are clear.  No pleural effusion or pneumothorax.  The bony thorax is intact.  IMPRESSION: No active disease of the chest.   Original Report Authenticated By: Domenic Moras, M.D.    Dg Abd 1 View  07/07/2012  *RADIOLOGY REPORT*  Clinical Data: Abdominal pain.  ABDOMEN - 1 VIEW  Comparison: Lumbar radiographs dated 08/10/2010  Findings:  The gallbladder has been removed.  IUD in place.  Bowel gas pattern is normal.  No visible free air or free fluid or worrisome abdominal calcifications.  No osseous abnormality.  Minimal atelectasis at the lung bases posteriorly.  IMPRESSION: Benign-appearing abdomen.   Original Report Authenticated By: Gwynn Burly, M.D.     Medications: Scheduled Meds:    . cholecalciferol  1,000 Units Oral Daily  . ciprofloxacin  500 mg Oral BID  . enoxaparin  40 mg Subcutaneous Q24H  . folic acid  1 mg Oral Daily  . influenza  inactive virus vaccine  0.5 mL Intramuscular Tomorrow-1000  . loratadine  10 mg Oral Daily  . oxyCODONE  10 mg Oral Q12H  . pneumococcal 23 valent vaccine  0.5 mL Intramuscular Tomorrow-1000   Continuous Infusions:    . 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 Bili 6.7 slowly trending down Evidence of active crisis . HGB 7.7 increased from 6.9 repeat r/o dilutional The patient will not receive blood exchange but if remains low will receive transfusion.  She will Receive pain/nausea/pruritis management. The patient is also receiving her home medications and IV hydration. CMP and CBC in the am.  Anemia: secondary to SCD CBC repeat  pending  Intermittent cough: resolved  Anxiety: increase tension and nervousness xanax .25 mg  Acute on chronic  pain minimal relief from IV hydromorphone added 30mg  IV prn for an adjunct to current pain medication  Nausea: Phenergan antiemetic prn  Elevated LFT's: secondary to sickle cell crisis will r/o other causative factors abdominal ultra sound r/o  organotomegaly    UTI: tx empirically with Cipro 500mg  bid for 7 days  Leukocytosis/ UTI  - Temp 99 Questionable on U/A awaiting Urine/Blood culture  Malnutrition with Hypoalbuminemia : albumin 2.9 some protein in urine increase protein in diet and dietary consult    LOS: 3 days  Tarus Briski P 07/08/2012, 10:31 AM

## 2012-07-09 DIAGNOSIS — M7989 Other specified soft tissue disorders: Secondary | ICD-10-CM

## 2012-07-09 LAB — TYPE AND SCREEN
ABO/RH(D): A POS
Antibody Screen: POSITIVE

## 2012-07-09 LAB — COMPREHENSIVE METABOLIC PANEL
BUN: 3 mg/dL — ABNORMAL LOW (ref 6–23)
CO2: 27 mEq/L (ref 19–32)
Calcium: 8.1 mg/dL — ABNORMAL LOW (ref 8.4–10.5)
Chloride: 105 mEq/L (ref 96–112)
Creatinine, Ser: 0.61 mg/dL (ref 0.50–1.10)
GFR calc Af Amer: >90 mL/min (ref >90)
GFR calc non Af Amer: >90 mL/min (ref >90)
Total Bilirubin: 4.7 mg/dL — ABNORMAL HIGH (ref 0.3–1.2)

## 2012-07-09 LAB — URINE CULTURE

## 2012-07-09 LAB — PROTEIN / CREATININE RATIO, URINE
Creatinine, Urine: 24.19 mg/dL
Protein Creatinine Ratio: 1.8 — ABNORMAL HIGH (ref 0.00–0.15)

## 2012-07-09 LAB — CBC WITH DIFFERENTIAL/PLATELET
Eosinophils Relative: 2 % (ref 0–5)
Hemoglobin: 6.4 g/dL — CL (ref 12.0–15.0)
Lymphocytes Relative: 27 % (ref 12–46)
Monocytes Absolute: 1.2 10*3/uL — ABNORMAL HIGH (ref 0.1–1.0)
Monocytes Relative: 14 % — ABNORMAL HIGH (ref 3–12)
Neutrophils Relative %: 57 % (ref 43–77)
Platelets: 184 10*3/uL (ref 150–400)
RBC: 1.84 MIL/uL — ABNORMAL LOW (ref 3.87–5.11)
WBC: 8.9 10*3/uL (ref 4.0–10.5)

## 2012-07-09 LAB — HEMOGLOBINOPATHY EVALUATION: Hgb A2 Quant: 3.3 % — ABNORMAL HIGH (ref 2.2–3.2)

## 2012-07-09 LAB — FERRITIN: Ferritin: 1612 ng/mL — ABNORMAL HIGH (ref 10–291)

## 2012-07-09 MED ORDER — HYDROXYUREA 500 MG PO CAPS
1000.0000 mg | ORAL_CAPSULE | ORAL | Status: DC
  Administered 2012-07-09 – 2012-07-21 (×7): 1000 mg via ORAL
  Filled 2012-07-09 (×7): qty 2

## 2012-07-09 MED ORDER — HYDROMORPHONE HCL PF 2 MG/ML IJ SOLN
4.0000 mg | Freq: Once | INTRAMUSCULAR | Status: AC
  Administered 2012-07-09: 4 mg via INTRAVENOUS
  Filled 2012-07-09: qty 2

## 2012-07-09 MED ORDER — PROMETHAZINE HCL 25 MG/ML IJ SOLN
25.0000 mg | Freq: Once | INTRAMUSCULAR | Status: AC
  Administered 2012-07-09: 25 mg via INTRAVENOUS

## 2012-07-09 MED ORDER — OXYCODONE HCL ER 10 MG PO T12A
10.0000 mg | EXTENDED_RELEASE_TABLET | Freq: Two times a day (BID) | ORAL | Status: DC
  Administered 2012-07-09 – 2012-07-21 (×24): 10 mg via ORAL
  Filled 2012-07-09 (×5): qty 1

## 2012-07-09 MED ORDER — ACETAMINOPHEN 325 MG PO TABS
650.0000 mg | ORAL_TABLET | Freq: Four times a day (QID) | ORAL | Status: AC | PRN
  Administered 2012-07-09: 650 mg via ORAL
  Filled 2012-07-09: qty 2

## 2012-07-09 MED ORDER — LACTULOSE 10 GM/15ML PO SOLN
20.0000 g | Freq: Every day | ORAL | Status: DC | PRN
  Administered 2012-07-10 – 2012-07-14 (×2): 20 g via ORAL
  Filled 2012-07-09 (×2): qty 30

## 2012-07-09 MED ORDER — DOCUSATE SODIUM 100 MG PO CAPS
100.0000 mg | ORAL_CAPSULE | Freq: Every day | ORAL | Status: DC
  Administered 2012-07-09 – 2012-07-21 (×13): 100 mg via ORAL
  Filled 2012-07-09 (×13): qty 1

## 2012-07-09 MED ORDER — PANTOPRAZOLE SODIUM 40 MG PO TBEC
40.0000 mg | DELAYED_RELEASE_TABLET | Freq: Every day | ORAL | Status: DC
  Administered 2012-07-09 – 2012-07-21 (×13): 40 mg via ORAL
  Filled 2012-07-09 (×14): qty 1

## 2012-07-09 MED ORDER — IBUPROFEN 400 MG PO TABS
400.0000 mg | ORAL_TABLET | Freq: Once | ORAL | Status: AC
  Administered 2012-07-09: 400 mg via ORAL
  Filled 2012-07-09: qty 1

## 2012-07-09 MED ORDER — ACETAMINOPHEN 325 MG PO TABS: 650.0000 mg | ORAL_TABLET | Freq: Four times a day (QID) | ORAL | Status: DC | PRN

## 2012-07-09 MED ORDER — HYDROXYUREA 500 MG PO CAPS: 500.0000 mg | ORAL_CAPSULE | ORAL | Status: DC

## 2012-07-09 MED ORDER — FOLIC ACID 1 MG PO TABS: 1.0000 mg | ORAL_TABLET | Freq: Every day | ORAL | Status: DC

## 2012-07-09 MED ORDER — HYDROXYUREA 500 MG PO CAPS
500.0000 mg | ORAL_CAPSULE | ORAL | Status: DC
  Administered 2012-07-10 – 2012-07-20 (×6): 500 mg via ORAL
  Filled 2012-07-09 (×7): qty 1

## 2012-07-09 NOTE — Progress Notes (Signed)
Subjective: Dawn [name] was seen on rounds today. She has been febrile today with T-Max 102.7 not responding well to Motrin given (temp currently 102). She is actively vomiting in the trash can but denies abdominal discomfort. She also continues to have complaints of pain in her bilateral forearms, thighs and lower legs. She currently rates her pain  9/10. Duplex negative RLE DVT.  She currently denies chills, headache, dizziness, chest pain, SOB, abdominal pain, or urinary symptoms including dysuria, hematuria, urgency or frequency and report being constipated. Appetite fair, but intake currently poor.   Objective: Vital signs in last 24 hours: Filed Vitals:   07/08/12 1707 07/08/12 2118 07/09/12 0150 07/09/12 0559  BP: 117/50 116/68 114/74 115/73  Pulse: 100 79 83 82  Temp: 98.2 F (36.8 C) 98.2 F (36.8 C) 99.6 F (37.6 C) 99.2 F (37.3 C)  TempSrc: Oral Oral Oral Oral  Resp: 20 18 18 18   Height:      Weight:      SpO2: 96% 93% 95% 94%   Weight change:   Intake/Output Summary (Last 24 hours) at 07/09/12 0906 Last data filed at 07/09/12 0551  Gross per 24 hour  Intake   3369 ml  Output      0 ml  Net   3369 ml    Physical Exam:  General appearance: Alert, cooperative, well nourished, well developed, mild distress  HEENT: Normocephalic, atraumatic, PERRL, mild scleral icterus, no sinus tenderness, oral pharynx moist, pink, patent without exudate, neck supple, trachea midline, no cervical lymphadenopathy, or thyroimegaly.  Back: Symmetric, mild mid/low diffuse lumbarsacral tenderness  Cardio: RRR, S1, S2 normal, no murmur/click/rub/gallop  GI: Soft, non-tender, distended, hypoactive bowel sounds, no organomegaly  Extremities: no cyanosis or edema, tender bilateral upper and lower extremities   Pulses: 2+ and symmetric  Skin: Skin color, texture, turgor normal, no rashes or lesions, tattoos on her upper back  Neurologic: A, O x 3, grossly intact, no focal deficits Psych:  Appropriate affect   Lab Results:  [facility] 07/09/12 0530 07/08/12 0355  NA 140 136  K 3.8 3.7  CL 105 101  CO2 27 26  GLUCOSE 97 105*  BUN 3* 4*  CREATININE 0.61 0.59  CALCIUM 8.1* 8.3*  MG -- --  PHOS -- --    Basename 07/09/12 0530 07/08/12 0355  AST 86* 101*  ALT 44* 51*  ALKPHOS 145* 141*  BILITOT 4.7* 6.7*  PROT 6.4 6.7  ALBUMIN 2.8* 2.9*   No results found for this basename: LIPASE:2,AMYLASE:2 in the last 72 hours  Basename 07/09/12 0530 07/08/12 1410 07/08/12 0355  WBC 8.9 7.3 --  NEUTROABS 5.1 -- 5.6  HGB 6.4* 7.1* --  HCT 17.8* 19.0* --  MCV 96.7 96.0 --  PLT 184 261 --   No results found for this basename: CKTOTAL:3,CKMB:3,CKMBINDEX:3,TROPONINI:3 in the last 72 hours No components found with this basename: POCBNP:3 No results found for this basename: DDIMER:2 in the last 72 hours No results found for this basename: HGBA1C:2 in the last 72 hours  Basename 07/08/12 0910  CHOL --  HDL --  LDLCALC --  TRIG --  CHOLHDL --  LDLDIRECT 55   No results found for this basename: TSH,T4TOTAL,FREET3,T3FREE,THYROIDAB in the last 72 hours No results found for this basename: VITAMINB12:2,FOLATE:2,FERRITIN:2,TIBC:2,IRON:2,RETICCTPCT:2 in the last 72 hours  Micro Results: Recent Results (from the past 240 hour(s))  CULTURE, BLOOD (ROUTINE X 2)     Status: Normal (Preliminary result)   Collection Time   07/06/12  8:40 PM      Component Value Range Status Comment   Specimen Description BLOOD LEFT HAND   Final    Special Requests BOTTLES DRAWN AEROBIC ONLY    Final    Culture  Setup Time 07/07/2012 04:40   Final    Culture     Final    Value:        BLOOD CULTURE RECEIVED NO GROWTH TO DATE CULTURE WILL BE HELD FOR 5 DAYS BEFORE ISSUING A FINAL NEGATIVE REPORT   Report Status PENDING   Incomplete   CULTURE, BLOOD (ROUTINE X 2)     Status: Normal (Preliminary result)   Collection Time   07/06/12  8:47 PM      Component Value Range Status Comment   Specimen  Description BLOOD LEFT HAND   Final    Special Requests BOTTLES DRAWN AEROBIC ONLY    Final    Culture  Setup Time 07/07/2012 04:40   Final    Culture     Final    Value:        BLOOD CULTURE RECEIVED NO GROWTH TO DATE CULTURE WILL BE HELD FOR 5 DAYS BEFORE ISSUING A FINAL NEGATIVE REPORT   Report Status PENDING   Incomplete   URINE CULTURE     Status: Normal   Collection Time   07/07/12 12:32 PM      Component Value Range Status Comment   Specimen Description URINE, CLEAN CATCH   Final    Special Requests NONE   Final    Culture  Setup Time 07/08/2012 01:26   Final    Colony Count 20,OOO COLONIES/ML   Final    Culture     Final    Value: Multiple bacterial morphotypes present, none predominant. Suggest appropriate recollection if clinically indicated.   Report Status 07/09/2012 FINAL   Final     Studies/Results: Dg Abd 1 View  07/07/2012  *RADIOLOGY REPORT*  Clinical Data: Abdominal pain.  ABDOMEN - 1 VIEW  Comparison: Lumbar radiographs dated 08/10/2010  Findings:  The gallbladder has been removed.  IUD in place.  Bowel gas pattern is normal.  No visible free air or free fluid or worrisome abdominal calcifications.  No osseous abnormality.  Minimal atelectasis at the lung bases posteriorly.  IMPRESSION: Benign-appearing abdomen.   Original Report Authenticated By: Gwynn Burly, M.D.     Medications: Scheduled Meds:    . cholecalciferol  1,000 Units Oral Daily  . ciprofloxacin  500 mg Oral BID  . enoxaparin  40 mg Subcutaneous Q24H  . folic acid  1 mg Oral Daily  . loratadine  10 mg Oral Daily  . oxyCODONE  10 mg Oral Q12H  . pantoprazole  40 mg Oral Daily  . sodium chloride  10-40 mL Intracatheter Q12H   Continuous Infusions:    . dextrose 5 % and 0.45% NaCl 125 mL/hr at 07/09/12 0549   PRN Meds:.ALPRAZolam, diphenhydrAMINE, diphenhydrAMINE, HYDROmorphone (DILAUDID) injection, ibuprofen, ketorolac, promethazine, sodium chloride  Assessment/Plan:  1) Sickle  cell crisis: SS Disease, with elevated Retic 36.6%, T. Bili 5.9% on adm; Max 7.5 and now trending down to 4.7 this am, evidence active hemolysis and resolving crisis. Pain unmanageable at home; as noted below, will correct anemia and consider exchange/transfusion tomorrow. continue gentle IV hydration, IV pain meds for breakthrough pain, antiemetic, anti-pruretic, and Lovenox for DVT prophylaxis. Will add bowel management regimen (Colace BID, prn Lactulose qhs). Most recent Hgb electrophoresis on 03/24/12 with Hgb A 6.3,  Hgb F 10.4, and Hgb S 80.1%; not on her home dosages of Hydrea and Folic acid; will resume today; repeat Hgb electrophoresis pending; follow  2) Anemia: Hgb 6.4 this am and felt secondary to active hemolysis; scheduled for transfusion 1 unit PRBC's today once fever resolved; if pain not improving on tomorrow and Hgb remains 7 or >, consider exchange, transfusion.  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; no chelation therapy at home or currently IV while inpatient; check Ferritin level today; start therapy this pm if >1,00; follow  6) Elevated LFT's- AST 86, ALT 44 this am; noting Alk P 150, AST 95 on adm; felt secondary to sickle cell crisis; no organomegaly appreciated; will get abdominal US to better visualize organs if numbers don't continue to trend downward 7) Questionable UTI: urine culture contaminated; was empirically placed on Cipro 500mg  bid for 7 days; no urinary symptoms; decrease length of treatment to 3 days; Repeat Urine Cx as noted below; follow  8)  Leukocytosis, fever of unknown origin - WBC 8.9 this am; (12.5 on 10/13); Temp today 101.3 to 102.7; CXR negative on 10/13, and Bld Cx (07/06/12) NTD; atelectasis noted KUB 10/14; encourage Incentive Spirometry; antibiotics as noted above; no signs infection at PICC insertion site; Motrin given with increasing temp per staff; discussed "allergy" Tylenol with patient regarding "history proteinuria and kidney functioning"; patient denies allergic reaction and agrees that prn tylenol in addition to motrin may be necessary to control her temp; Tylenol 650mg  and Motrin 400mg  po x 1 dose now; Repeat Bld Cx and Repeat Urine Cx now; has had influenza and pneumovax  Vaccines on 07/06/12; follow  9) Abdominal pain/acute N/V- abd pain has resolved but active N/V at present; suspect ongoing temp (102 at present) factor; negative KUB on 07/07/12; of note, IUD in place and GB removed; Phenergan 25mg  IV x 1 dose now  10) Right Leg swelling/pain- duplex scan without evidence of deep vein thrombosis involving the right lower extremity and left common femoral vein but was unable to visualize the right posterior tibial and peroneal veins, and therefore unable to exclude deep vein thrombosis in these vessels. Will continue to monitor and consider CTA lower extremity if warranted; remains on lovenox as noted above 11) Malnutrition- albumin 2.8 this am; felt multifactorial secondary to #'s 1, 6, and 13; noted dietitian evaluation; despite overweight and good appetite, chronic low (< 3.7 with goal 4.0) since 08/21/10 according to hospital records; with documented chronic protein loss, will encourage high protein diet (add 80-90 gm while inpatient); follow 12) Vitamin D Deficiency- on daily supplement; get baseline 25-OH level; follow 13) Proteinuria: chronic proteinuria (patienr reports since 2008 and unable to tolerated ACEI (cough); also noted on each urinalysis obtained here since 05/26/11 and as much as 3gm in 12/12 and 05/20/12  specimens; has normal kidney function (creatinine level 0.61); spot protein/creatinine ratio; BP soft, suspect secondary to pain meds; however, recommend low dose ARB such as Diovan if BP permits; follow closely  14) Disposition- discharge when able to manage her pain at home    LOS: 4 days  Lizbeth Bark Pager [phone] 07/09/2012, 9:06 AM

## 2012-07-09 NOTE — Progress Notes (Signed)
*  Preliminary Results* Right lower extremity venous duplex completed. Right lower extremity is negative for deep vein thrombosis. Unable to visualize the right posterior tibial and peroneal veins, therefore unable to exclude deep vein thrombosis in this segment.  07/09/2012 10:34 AM Gertie Fey, RDMS, RDCS

## 2012-07-09 NOTE — Progress Notes (Signed)
Pt c/o increased RLE swelling that began last night. MD made aware.

## 2012-07-09 NOTE — Progress Notes (Signed)
CRITICAL VALUE ALERT  Critical value received:  Hgb 6.4  Date of notification:  t  Time of notification:  0725  Critical value read back:yes  Nurse who received alert:  Arleta Creek  MD notified (1st page):  Dr. August Saucer  Time of first page:  0745  MD notified (2nd page):  Time of second page:  Responding MD:  Dr. August Saucer  Time MD responded:  [phone]

## 2012-07-10 LAB — COMPREHENSIVE METABOLIC PANEL
AST: 74 U/L — ABNORMAL HIGH (ref 0–37)
Albumin: 2.6 g/dL — ABNORMAL LOW (ref 3.5–5.2)
Alkaline Phosphatase: 139 U/L — ABNORMAL HIGH (ref 39–117)
BUN: 3 mg/dL — ABNORMAL LOW (ref 6–23)
Chloride: 105 mEq/L (ref 96–112)
Potassium: 2.9 mEq/L — ABNORMAL LOW (ref 3.5–5.1)
Total Bilirubin: 4.3 mg/dL — ABNORMAL HIGH (ref 0.3–1.2)

## 2012-07-10 LAB — CBC WITH DIFFERENTIAL/PLATELET
Basophils Absolute: 0.1 10*3/uL (ref 0.0–0.1)
Lymphs Abs: 2.6 10*3/uL (ref 0.7–4.0)
MCH: 34.4 pg — ABNORMAL HIGH (ref 26.0–34.0)
MCV: 94.8 fL (ref 78.0–100.0)
Monocytes Absolute: 1.2 10*3/uL — ABNORMAL HIGH (ref 0.1–1.0)
Platelets: 219 10*3/uL (ref 150–400)
RDW: 26.1 % — ABNORMAL HIGH (ref 11.5–15.5)

## 2012-07-10 LAB — BASIC METABOLIC PANEL
BUN: 3 mg/dL — ABNORMAL LOW (ref 6–23)
CO2: 28 mEq/L (ref 19–32)
Chloride: 107 mEq/L (ref 96–112)
GFR calc non Af Amer: >90 mL/min (ref >90)
Glucose, Bld: 110 mg/dL — ABNORMAL HIGH (ref 70–99)
Potassium: 3.3 mEq/L — ABNORMAL LOW (ref 3.5–5.1)

## 2012-07-10 LAB — URINE CULTURE: Culture: NO GROWTH

## 2012-07-10 MED ORDER — KCL IN DEXTROSE-NACL 20-5-0.45 MEQ/L-%-% IV SOLN
INTRAVENOUS | Status: DC
  Administered 2012-07-11 (×2): via INTRAVENOUS
  Filled 2012-07-10 (×3): qty 1000

## 2012-07-10 MED ORDER — POTASSIUM CHLORIDE CRYS ER 20 MEQ PO TBCR: 20.0000 meq | EXTENDED_RELEASE_TABLET | Freq: Every day | ORAL | Status: DC

## 2012-07-10 MED ORDER — POTASSIUM CHLORIDE CRYS ER 20 MEQ PO TBCR
30.0000 meq | EXTENDED_RELEASE_TABLET | Freq: Two times a day (BID) | ORAL | Status: DC
  Administered 2012-07-11 – 2012-07-21 (×21): 30 meq via ORAL
  Filled 2012-07-10 (×24): qty 1

## 2012-07-10 MED ORDER — POTASSIUM CHLORIDE CRYS ER 20 MEQ PO TBCR
40.0000 meq | EXTENDED_RELEASE_TABLET | Freq: Once | ORAL | Status: AC
  Administered 2012-07-10: 40 meq via ORAL
  Filled 2012-07-10: qty 2

## 2012-07-10 MED ORDER — POTASSIUM CHLORIDE 10 MEQ/100ML IV SOLN
10.0000 meq | INTRAVENOUS | Status: DC
  Filled 2012-07-10 (×3): qty 100

## 2012-07-10 NOTE — Progress Notes (Signed)
Received an update from blood bank regarding blood transfusion status.  Blood unit coming from [place] and would be available tomorrow morning. NP notified. Pt informed.

## 2012-07-10 NOTE — Progress Notes (Signed)
Subjective: [name] was seen on rounds today. She has been afebrile today with T-Max 98.2  Has been as high as  102.7 during this admission). She has not ad any emesis today. She also continues to have complaints of pain in her bilateral forearms, thighs and lower legs. She currently rates her pain  9/10. Duplex negative RLE DVT.  She currently denies chills, headache, dizziness, chest pain, SOB, abdominal pain, or urinary symptoms including dysuria, hematuria, urgency or frequency. Appetite fair, but intake currently poor.   Objective: Vital signs in last 24 hours: Filed Vitals:   07/10/12 0200 07/10/12 0620 07/10/12 1045 07/10/12 1435  BP: 112/60 119/63 118/66 112/78  Pulse: 96 85 108 104  Temp: 98.2 F (36.8 C) 97.4 F (36.3 C) 98.6 F (37 C) 98.8 F (37.1 C)  TempSrc: Oral Oral Oral Oral  Resp: 16 16 17 16   Height:      Weight:      SpO2: 94% 99% 97% 97%   Weight change:   Intake/Output Summary (Last 24 hours) at 07/10/12 1522 Last data filed at 07/10/12 1435  Gross per 24 hour  Intake   1760 ml  Output    400 ml  Net   1360 ml    Physical Exam:  General appearance: Alert, cooperative, well nourished, well developed, no apparent distress  Head: Normocephalic, without obvious abnormality, atraumatic  Eyes: PERRLA, EOMI, mild scleral icterus  Nose: Nares, septum and mucosa are normal, no sinus tenderness  Throat: Lips, mucosa, and tongue are normal; teeth and gums are normal  Neck: No adenopathy, supple, symmetrical, trachea midline and thyroid not enlarged, symmetric, no tenderness/mass/nodules  Back: Symmetric, no curvature, improved ROM, CVA tenderness, diffuse lower/middle tenderness  Cardio: Regular rate and rhythm, S1, S2 normal, no murmur/click/rub/gallop  GI: Soft, non-tender, distended, hypoactive bowel sounds, no organomegaly  Extremities: Extremities normal, atraumatic, no cyanosis or edema, tender bilateral UE, LEs  Pulses: 2+ and symmetric  Skin: Skin  color, texture, turgor normal, no rashes or lesions, tattoos on her upper back  Neurologic: Grossly normal, AO x 3, no focal deficits, CN II - XII intact  Psych: Appropriate affect   Lab Results:  Basename 07/10/12 0454 07/09/12 0530  NA 139 140  K 2.9* 3.8  CL 105 105  CO2 27 27  GLUCOSE 95 97  BUN 3* 3*  CREATININE 0.55 0.61  CALCIUM 8.0* 8.1*  MG -- --  PHOS -- --    Basename 07/10/12 0454 07/09/12 0530  AST 74* 86*  ALT 43* 44*  ALKPHOS 139* 145*  BILITOT 4.3* 4.7*  PROT 6.1 6.4  ALBUMIN 2.6* 2.8*   No results found for this basename: LIPASE:2,AMYLASE:2 in the last 72 hours  Basename 07/10/12 0454 07/09/12 0530  WBC 10.0 8.9  NEUTROABS 5.8 5.1  HGB 6.6* 6.4*  HCT 18.2* 17.8*  MCV 94.8 96.7  PLT 219 184   No results found for this basename: CKTOTAL:3,CKMB:3,CKMBINDEX:3,TROPONINI:3 in the last 72 hours No components found with this basename: POCBNP:3 No results found for this basename: DDIMER:2 in the last 72 hours No results found for this basename: HGBA1C:2 in the last 72 hours  Basename 07/08/12 0910  CHOL --  HDL --  LDLCALC --  TRIG --  CHOLHDL --  LDLDIRECT 55   No results found for this basename: TSH,T4TOTAL,FREET3,T3FREE,THYROIDAB in the last 72 hours  Basename 07/09/12 1105  VITAMINB12 --  FOLATE --  FERRITIN 1612*  TIBC --  IRON --  RETICCTPCT --  Micro Results: Recent Results (from the past 240 hour(s))  CULTURE, BLOOD (ROUTINE X 2)     Status: Normal (Preliminary result)   Collection Time   07/06/12  8:40 PM      Component Value Range Status Comment   Specimen Description BLOOD LEFT HAND   Final    Special Requests BOTTLES DRAWN AEROBIC ONLY    Final    Culture  Setup Time 07/07/2012 04:40   Final    Culture     Final    Value:        BLOOD CULTURE RECEIVED NO GROWTH TO DATE CULTURE WILL BE HELD FOR 5 DAYS BEFORE ISSUING A FINAL NEGATIVE REPORT   Report Status PENDING   Incomplete   CULTURE, BLOOD (ROUTINE X 2)     Status:  Normal (Preliminary result)   Collection Time   07/06/12  8:47 PM      Component Value Range Status Comment   Specimen Description BLOOD LEFT HAND   Final    Special Requests BOTTLES DRAWN AEROBIC ONLY    Final    Culture  Setup Time 07/07/2012 04:40   Final    Culture     Final    Value:        BLOOD CULTURE RECEIVED NO GROWTH TO DATE CULTURE WILL BE HELD FOR 5 DAYS BEFORE ISSUING A FINAL NEGATIVE REPORT   Report Status PENDING   Incomplete   URINE CULTURE     Status: Normal   Collection Time   07/07/12 12:32 PM      Component Value Range Status Comment   Specimen Description URINE, CLEAN CATCH   Final    Special Requests NONE   Final    Culture  Setup Time 07/08/2012 01:26   Final    Colony Count 20,OOO COLONIES/ML   Final    Culture     Final    Value: Multiple bacterial morphotypes present, none predominant. Suggest appropriate recollection if clinically indicated.   Report Status 07/09/2012 FINAL   Final   CULTURE, BLOOD (ROUTINE X 2)     Status: Normal (Preliminary result)   Collection Time   07/09/12  5:20 PM      Component Value Range Status Comment   Specimen Description BLOOD RIGHT HAND   Final    Special Requests BOTTLES DRAWN AEROBIC AND ANAEROBIC 5CC   Final    Culture  Setup Time 07/09/2012 22:43   Final    Culture     Final    Value:        BLOOD CULTURE RECEIVED NO GROWTH TO DATE CULTURE WILL BE HELD FOR 5 DAYS BEFORE ISSUING A FINAL NEGATIVE REPORT   Report Status PENDING   Incomplete   CULTURE, BLOOD (ROUTINE X 2)     Status: Normal (Preliminary result)   Collection Time   07/09/12  5:22 PM      Component Value Range Status Comment   Specimen Description BLOOD RIGHT HAND   Final    Special Requests BOTTLES DRAWN AEROBIC AND ANAEROBIC 5.5CC   Final    Culture  Setup Time 07/09/2012 22:43   Final    Culture     Final    Value:        BLOOD CULTURE RECEIVED NO GROWTH TO DATE CULTURE WILL BE HELD FOR 5 DAYS BEFORE ISSUING A FINAL NEGATIVE REPORT   Report  Status PENDING   Incomplete     Studies/Results: No results found.  Medications:  Scheduled Meds:    . cholecalciferol  1,000 Units Oral Daily  . ciprofloxacin  500 mg Oral BID  . docusate sodium  100 mg Oral Daily  . enoxaparin  40 mg Subcutaneous Q24H  . folic acid  1 mg Oral Daily  .  HYDROmorphone (DILAUDID) injection  4 mg Intravenous Once  . hydroxyurea  500 mg Oral QODAY   And  . hydroxyurea  1,000 mg Oral QODAY  . ibuprofen  400 mg Oral Once  . loratadine  10 mg Oral Daily  . OxyCODONE  10 mg Oral Q12H  . pantoprazole  40 mg Oral Daily  . promethazine  25 mg Intravenous Once  . sodium chloride  10-40 mL Intracatheter Q12H   Continuous Infusions:    . dextrose 5 % and 0.45% NaCl 125 mL/hr at 07/10/12 1349   PRN Meds:.acetaminophen, ALPRAZolam, diphenhydrAMINE, diphenhydrAMINE, HYDROmorphone (DILAUDID) injection, ibuprofen, ketorolac, lactulose, promethazine, sodium chloride, DISCONTD: acetaminophen  Assessment/Plan:  Sickle cell crisis: SS Disease, with elevated Retic 36.6%, T. Bili 5.9% on adm; Max 7.5 and now trending down to 4.3 , evidence active hemolysis and slowly resolving crisis.  Continue gentle IV hydration, IV pain meds for breakthrough pain, antiemetic, anti-pruretic, and Lovenox for DVT prophylaxis. Anemia: Hgb 6.3 this am and secondary to active hemolysis; scheduled for transfusion 1 unit PRBC's awaiting arrive. 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; no chelation therapy at home or currently- hx of desferal   Elevated LFT's- econdary to sickle cell crisis; no organomegaly    Questionable UTI: symptoms; decrease length of treatment to 3 days  Leukocytosis, fever of unknown or  Abdominal pain/acute N/V- abd pain has resolved but active N/V at present; suspect ongoing temp factor; negative KUB on 07/07/12; of note, IUD in place and GB removed Peripheral edema : hands and LE swelling warm to touch multifactorial low protein with 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 She has been transfused tonight. She will need exchange transfusion tomorrow. We will change her IV fluids to D5 1/2 NS with KCL 20 meq/liter. Follow up on lytes.  Pamela Maddy L. August Saucer, MD

## 2012-07-11 LAB — COMPREHENSIVE METABOLIC PANEL
BUN: 3 mg/dL — ABNORMAL LOW (ref 6–23)
CO2: 27 mEq/L (ref 19–32)
Chloride: 104 mEq/L (ref 96–112)
Creatinine, Ser: 0.61 mg/dL (ref 0.50–1.10)
GFR calc Af Amer: >90 mL/min (ref >90)
GFR calc non Af Amer: >90 mL/min (ref >90)
Glucose, Bld: 86 mg/dL (ref 70–99)
Total Bilirubin: 5.5 mg/dL — ABNORMAL HIGH (ref 0.3–1.2)

## 2012-07-11 LAB — CBC WITH DIFFERENTIAL/PLATELET
Basophils Absolute: 0.1 10*3/uL (ref 0.0–0.1)
Eosinophils Relative: 4 % (ref 0–5)
Monocytes Relative: 14 % — ABNORMAL HIGH (ref 3–12)
Neutrophils Relative %: 49 % (ref 43–77)
Platelets: 214 10*3/uL (ref 150–400)
RBC: 2.3 MIL/uL — ABNORMAL LOW (ref 3.87–5.11)
RDW: 24.4 % — ABNORMAL HIGH (ref 11.5–15.5)
WBC: 7.9 10*3/uL (ref 4.0–10.5)

## 2012-07-11 MED ORDER — DEXTROSE-NACL 5-0.45 % IV SOLN
INTRAVENOUS | Status: DC
  Administered 2012-07-11 – 2012-07-17 (×11): via INTRAVENOUS
  Administered 2012-07-17: 1000 mL via INTRAVENOUS
  Administered 2012-07-18 (×2): via INTRAVENOUS
  Administered 2012-07-18: 1000 mL via INTRAVENOUS
  Administered 2012-07-19 – 2012-07-20 (×3): via INTRAVENOUS

## 2012-07-11 MED ORDER — DIPHENHYDRAMINE HCL 50 MG/ML IJ SOLN
25.0000 mg | Freq: Once | INTRAMUSCULAR | Status: AC
  Administered 2012-07-11: 25 mg via INTRAVENOUS
  Filled 2012-07-11: qty 1

## 2012-07-11 MED ORDER — HYDROMORPHONE HCL PF 2 MG/ML IJ SOLN
4.0000 mg | INTRAMUSCULAR | Status: AC
  Administered 2012-07-11 – 2012-07-12 (×12): 4 mg via INTRAVENOUS
  Filled 2012-07-11 (×8): qty 2

## 2012-07-11 MED ORDER — HYDROMORPHONE HCL PF 2 MG/ML IJ SOLN
2.0000 mg | INTRAMUSCULAR | Status: DC | PRN
  Administered 2012-07-12 – 2012-07-13 (×10): 4 mg via INTRAVENOUS
  Administered 2012-07-13: 2 mg via INTRAVENOUS
  Administered 2012-07-14: 4 mg via INTRAVENOUS
  Administered 2012-07-14: 2 mg via INTRAVENOUS
  Administered 2012-07-14 – 2012-07-16 (×21): 4 mg via INTRAVENOUS
  Administered 2012-07-16: 2 mg via INTRAVENOUS
  Administered 2012-07-16: 4 mg via INTRAVENOUS
  Administered 2012-07-16: 2 mg via INTRAVENOUS
  Administered 2012-07-16 (×5): 4 mg via INTRAVENOUS
  Administered 2012-07-17: 2 mg via INTRAVENOUS
  Administered 2012-07-17 (×6): 4 mg via INTRAVENOUS
  Administered 2012-07-17: 2 mg via INTRAVENOUS
  Administered 2012-07-17: 4 mg via INTRAVENOUS
  Administered 2012-07-17: 2 mg via INTRAVENOUS
  Administered 2012-07-17 – 2012-07-19 (×13): 4 mg via INTRAVENOUS
  Administered 2012-07-19: 2 mg via INTRAVENOUS
  Administered 2012-07-19 (×3): 4 mg via INTRAVENOUS
  Administered 2012-07-19: 2 mg via INTRAVENOUS
  Administered 2012-07-20 – 2012-07-21 (×12): 4 mg via INTRAVENOUS
  Filled 2012-07-11 (×2): qty 2
  Filled 2012-07-11: qty 1
  Filled 2012-07-11 (×2): qty 2
  Filled 2012-07-11: qty 1
  Filled 2012-07-11 (×45): qty 2
  Filled 2012-07-11: qty 1
  Filled 2012-07-11 (×5): qty 2
  Filled 2012-07-11: qty 1
  Filled 2012-07-11 (×29): qty 2

## 2012-07-11 MED ORDER — VITAMIN D (ERGOCALCIFEROL) 1.25 MG (50000 UNIT) PO CAPS
50000.0000 [IU] | ORAL_CAPSULE | ORAL | Status: DC
  Administered 2012-07-12 – 2012-07-19 (×2): 50000 [IU] via ORAL
  Filled 2012-07-11 (×2): qty 1

## 2012-07-11 NOTE — Care Management Note (Unsigned)
    Page 1 of 1   07/11/2012     9:32:38 AM   CARE MANAGEMENT NOTE 07/11/2012  Patient:  Mitchner,Dawn [name]   Account Number:  [id number]  Date Initiated:  07/11/2012  Documentation initiated by:  Konrad Felix  Subjective/Objective Assessment:   Patient admitted with sickle cell crisis.     Action/Plan:   Discharge to home when medically stable.   Anticipated DC Date:  07/14/2012   Anticipated DC Plan:  HOME/SELF CARE      DC Planning Services  CM consult      Choice offered to / List presented to:             Status of service:  In process, will continue to follow Medicare Important Message given?   (If response is "NO", the following Medicare IM given date fields will be blank) Date Medicare IM given:   Date Additional Medicare IM given:    Discharge Disposition:    Per UR Regulation:    If discussed at Long Length of Stay Meetings, dates discussed:    Comments:  07/11/2012  Konrad Felix RN, case manager   [phone] Expresses 9/10 pain, Hgb 6.4, K+ 2.9

## 2012-07-11 NOTE — Progress Notes (Signed)
Subjective: [name] was seen on rounds today. She is currently afebrile with T-max 99. 3 since yesterday. She continues to have significant amount of pain  in her bilateral forearms, and thighs (below knees), and low back. She report pain only minor in low back and rated 2-3/10. Her arms and legs, however, are now 7/10. She reports having difficulty getting her pain meds when asked which intensifies the pain. She currently denies fever, chills, nausea, vomiting, headache, dizziness, chest pain, SOB, abdominal pain, or urinary symptoms including dysuria, hematuria, urgency or frequency and constipation now resolved. Appetite remains fair, but intake currently poor.   Objective: Vital signs in last 24 hours: Filed Vitals:   07/10/12 2100 07/11/12 0220 07/11/12 0230 07/11/12 0645  BP: 113/70 128/75  121/78  Pulse: 100 107  107  Temp: 98.3 F (36.8 C) 98.8 F (37.1 C)  98.8 F (37.1 C)  TempSrc: Oral Oral  Oral  Resp: 18 16  16   Height:      Weight:      SpO2:  86% 98% 93%   Weight change:   Intake/Output Summary (Last 24 hours) at 07/11/12 0949 Last data filed at 07/10/12 2100  Gross per 24 hour  Intake   2305 ml  Output      0 ml  Net   2305 ml    Physical Exam:  General appearance: Alert, cooperative, well nourished, well developed, mild distress  HEENT: Normocephalic, atraumatic, PERRL, mild scleral icterus, no sinus tenderness, oral pharynx moist, pink, patent without exudate, neck supple, trachea midline, no cervical lymphadenopathy, or thyroimegaly.  Back: Symmetric, mild mid/low diffuse lumbarsacral tenderness  Cardio: RRR, S1, S2 normal, no murmur/click/rub/gallop  GI: Soft, non-tender, distended, hypoactive bowel sounds, no organomegaly  Extremities: no cyanosis or edema, tender bilateral upper and lower extremities; left PICC   Pulses: 2+ and symmetric  Skin: Skin color, texture, turgor normal, no rashes or lesions, tattoos on her upper back  Neurologic: A, O x 3,  grossly intact, no focal deficits Psych: Appropriate affect   Lab Results:  Basename 07/11/12 0500 07/10/12 1730  NA 138 140  K 3.8 3.3*  CL 104 107  CO2 27 28  GLUCOSE 86 110*  BUN 3* 3*  CREATININE 0.61 0.61  CALCIUM 8.2* 7.9*  MG -- --  PHOS -- --    Basename 07/11/12 0500 07/10/12 0454  AST 87* 74*  ALT 46* 43*  ALKPHOS 149* 139*  BILITOT 5.5* 4.3*  PROT 6.3 6.1  ALBUMIN 2.7* 2.6*   No results found for this basename: LIPASE:2,AMYLASE:2 in the last 72 hours  Basename 07/11/12 0500 07/10/12 0454  WBC 7.9 10.0  NEUTROABS 3.9 5.8  HGB 8.0* 6.6*  HCT 21.7* 18.2*  MCV 94.3 94.8  PLT 214 219   No results found for this basename: CKTOTAL:3,CKMB:3,CKMBINDEX:3,TROPONINI:3 in the last 72 hours No components found with this basename: POCBNP:3 No results found for this basename: DDIMER:2 in the last 72 hours No results found for this basename: HGBA1C:2 in the last 72 hours No results found for this basename: CHOL:2,HDL:2,LDLCALC:2,TRIG:2,CHOLHDL:2,LDLDIRECT:2 in the last 72 hours No results found for this basename: TSH,T4TOTAL,FREET3,T3FREE,THYROIDAB in the last 72 hours  Basename 07/09/12 1105  VITAMINB12 --  FOLATE --  FERRITIN 1612*  TIBC --  IRON --  RETICCTPCT --    Micro Results: Recent Results (from the past 240 hour(s))  CULTURE, BLOOD (ROUTINE X 2)     Status: Normal (Preliminary result)   Collection Time   07/06/12  8:40 PM      Component Value Range Status Comment   Specimen Description BLOOD LEFT HAND   Final    Special Requests BOTTLES DRAWN AEROBIC ONLY    Final    Culture  Setup Time 07/07/2012 04:40   Final    Culture     Final    Value:        BLOOD CULTURE RECEIVED NO GROWTH TO DATE CULTURE WILL BE HELD FOR 5 DAYS BEFORE ISSUING A FINAL NEGATIVE REPORT   Report Status PENDING   Incomplete   CULTURE, BLOOD (ROUTINE X 2)     Status: Normal (Preliminary result)   Collection Time   07/06/12  8:47 PM      Component Value Range Status Comment    Specimen Description BLOOD LEFT HAND   Final    Special Requests BOTTLES DRAWN AEROBIC ONLY    Final    Culture  Setup Time 07/07/2012 04:40   Final    Culture     Final    Value:        BLOOD CULTURE RECEIVED NO GROWTH TO DATE CULTURE WILL BE HELD FOR 5 DAYS BEFORE ISSUING A FINAL NEGATIVE REPORT   Report Status PENDING   Incomplete   URINE CULTURE     Status: Normal   Collection Time   07/07/12 12:32 PM      Component Value Range Status Comment   Specimen Description URINE, CLEAN CATCH   Final    Special Requests NONE   Final    Culture  Setup Time 07/08/2012 01:26   Final    Colony Count 20,OOO COLONIES/ML   Final    Culture     Final    Value: Multiple bacterial morphotypes present, none predominant. Suggest appropriate recollection if clinically indicated.   Report Status 07/09/2012 FINAL   Final   CULTURE, BLOOD (ROUTINE X 2)     Status: Normal (Preliminary result)   Collection Time   07/09/12  5:20 PM      Component Value Range Status Comment   Specimen Description BLOOD RIGHT HAND   Final    Special Requests BOTTLES DRAWN AEROBIC AND ANAEROBIC 5CC   Final    Culture  Setup Time 07/09/2012 22:43   Final    Culture     Final    Value:        BLOOD CULTURE RECEIVED NO GROWTH TO DATE CULTURE WILL BE HELD FOR 5 DAYS BEFORE ISSUING A FINAL NEGATIVE REPORT   Report Status PENDING   Incomplete   CULTURE, BLOOD (ROUTINE X 2)     Status: Normal (Preliminary result)   Collection Time   07/09/12  5:22 PM      Component Value Range Status Comment   Specimen Description BLOOD RIGHT HAND   Final    Special Requests BOTTLES DRAWN AEROBIC AND ANAEROBIC 5.5CC   Final    Culture  Setup Time 07/09/2012 22:43   Final    Culture     Final    Value:        BLOOD CULTURE RECEIVED NO GROWTH TO DATE CULTURE WILL BE HELD FOR 5 DAYS BEFORE ISSUING A FINAL NEGATIVE REPORT   Report Status PENDING   Incomplete   URINE CULTURE     Status: Normal   Collection Time   07/09/12  6:05 PM       Component Value Range Status Comment   Specimen Description URINE, CATHETERIZED   Final  Special Requests cipro   Final    Culture  Setup Time 07/10/2012 01:45   Final    Colony Count NO GROWTH   Final    Culture NO GROWTH   Final    Report Status 07/10/2012 FINAL   Final     Studies/Results: No results found.  Medications: Scheduled Meds:    . cholecalciferol  1,000 Units Oral Daily  . docusate sodium  100 mg Oral Daily  . enoxaparin  40 mg Subcutaneous Q24H  . folic acid  1 mg Oral Daily  . hydroxyurea  500 mg Oral QODAY   And  . hydroxyurea  1,000 mg Oral QODAY  . loratadine  10 mg Oral Daily  . OxyCODONE  10 mg Oral Q12H  . pantoprazole  40 mg Oral Daily  . potassium chloride  30 mEq Oral BID  . potassium chloride  40 mEq Oral Once  . sodium chloride  10-40 mL Intracatheter Q12H  . DISCONTD: potassium chloride  10 mEq Intravenous Q1 Hr x 3  . DISCONTD: potassium chloride  20 mEq Oral Daily   Continuous Infusions:    . dextrose 5 % and 0.45 % NaCl with KCl 20 mEq/L 100 mL/hr at 07/11/12 0146  . DISCONTD: dextrose 5 % and 0.45% NaCl 125 mL/hr at 07/10/12 1349   PRN Meds:.acetaminophen, ALPRAZolam, diphenhydrAMINE, diphenhydrAMINE, HYDROmorphone (DILAUDID) injection, ibuprofen, ketorolac, lactulose, promethazine, sodium chloride  Assessment/Plan:  1) Sickle cell crisis: SS Disease, T. Bili 5.5 this am (4.3 yesterday); and evidence ongoing active hemolysis. Pain remains unmanageable at home; s/p exchange/transfusion; continue gentle IV hydration, IV pain meds for breakthrough pain, antiemetic, anti-pruretic, and bowel management regimen;  Lovenox for DVT prophylaxis. Most recent Hgb electrophoresis on 03/24/12 with Hgb A 6.3, Hgb F 10.4, and Hgb S 80.1%; now on Hydrea and Folic acid with repeat Hgb electrophoresis pending; follow  2) Hypokalemia- 3.8 this am (2.9-3.3 on yesterday); corrected with potassium supplements (30 meq po BID and 20 meq added to IVF; will D/C in IVF  and follow closely  3) Anemia: Hgb 8.0 this am following transfusion 1 unit PRBC's on yesterday; will schedule exchange/transfusion today (remove 300cc/transfuse 1 unit).  4) Anxiety/questionable underlying depression: (suspect underlying depression after the loss of her son); has prn Xanax; refused Cymbalta in the past   5) Acute on chronic pain- schedule Dilaudid IV x 24 hrs then resume prn for severe breakthrough pain; long-acting Oxycontin BID, on Protonix for GI prophylaxis with Toradol IV x 5 days only and Motrin both prn; AVOID NSAID's with chronic proteinuria;  follow  6) Secondary Hemachromatosis- repeat Ferritin 1612 on 07/09/12; previously 1266 on 03/24/12; has questionable allergy to Desferal (rash); wasn't on chelation therapy at home; attempt Exjade po daily (when discharged); follow  7) Elevated LFT's- AST 87, ALT 46 with Alk P 149 this am (AST 95, Alk P 150 on adm); felt secondary to sickle cell crisis; no organomegaly appreciated; follow  8) Questionable UTI: urine Cx 07/09/12 negative; completed 3 day course Cipro 500mg  bid 9) Leukocytosis, fever of unknown origin - WBC 7.9 this am with mild left shift on peripheral smear; currently afebrile; atelectasis on recent KUB; continue encourage Incentive Spirometry; 10/16 urine Cx negative and preliminary repeat Blx Cx NTD; had influenza and pneumovax vaccines on 07/06/12; follow closely 10) Abdominal pain/acute N/V- none x 24 hours, resolved.  Suspect n/v secondary to elevated temp (102.7 on Wed); had negative KUB on 07/07/12; of note, IUD in place and GB removed 11) RLE,  bilateral hand pain/swelling- duplex scan without evidence of deep vein thrombosis RLE; on Lovenox VTE; 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; will continue to encourage limb elevation noting low albumin as well; monitor circulation closely; follow 12) Malnutrition- albumin 2.7 this am; felt multifactorial secondary acute crisis, abnormal LFT's, and chronic proteinuria;  continue high protein diet (add 80-90 gm); follow 13) Vitamin D Deficiency- on daily supplement; 25-OH level 14 on yesterday; change to Ergocalciferol 50,000 IU weekly x 12; repeat level 3 months outpatient setting 14) Proteinuria: chronic proteinuria (since 2008 per patient); unable to tolerated ACEI (cough); has normal kidney function (creatinine level 0.61); spot protein/creatinine ratio 1.8 on 07/09/12; however BP's remain soft, suspect secondary to pain meds; consider low dose ARB; will need re-evaluation either prior to discharge or when seen f/u outpt setting  15) Vascular Access- wants to be considered for port-cath-placement again; however, has had 2 infected accesses in the past; mast recently Jan, 2013 ([facility])  16) Disposition- discharge when evidence of crisis resolution and able to manage her pain at home    LOS: 6 days  Lizbeth Bark Pager [phone] 07/11/2012, 9:49 AM

## 2012-07-12 LAB — CBC WITH DIFFERENTIAL/PLATELET
Basophils Absolute: 0 10*3/uL (ref 0.0–0.1)
Eosinophils Absolute: 0.2 10*3/uL (ref 0.0–0.7)
Hemoglobin: 8.6 g/dL — ABNORMAL LOW (ref 12.0–15.0)
Lymphocytes Relative: 48 % — ABNORMAL HIGH (ref 12–46)
MCHC: 36.8 g/dL — ABNORMAL HIGH (ref 30.0–36.0)
Neutrophils Relative %: 43 % (ref 43–77)
RDW: 24.6 % — ABNORMAL HIGH (ref 11.5–15.5)
WBC: 6.1 10*3/uL (ref 4.0–10.5)

## 2012-07-12 LAB — COMPREHENSIVE METABOLIC PANEL
ALT: 43 U/L — ABNORMAL HIGH (ref 0–35)
Alkaline Phosphatase: 150 U/L — ABNORMAL HIGH (ref 39–117)
BUN: 3 mg/dL — ABNORMAL LOW (ref 6–23)
CO2: 26 mEq/L (ref 19–32)
Calcium: 8.3 mg/dL — ABNORMAL LOW (ref 8.4–10.5)
GFR calc Af Amer: >90 mL/min (ref >90)
GFR calc non Af Amer: >90 mL/min (ref >90)
Glucose, Bld: 104 mg/dL — ABNORMAL HIGH (ref 70–99)
Sodium: 137 mEq/L (ref 135–145)

## 2012-07-12 NOTE — Progress Notes (Signed)
Subjective:  Patient complain of arm and leg pain grade 7/10 denies any chest pain or shortness of breath Objective:  Vital Signs in the last 24 hours: Temp:  [98.4 F (36.9 C)-99.2 F (37.3 C)] 98.4 F (36.9 C) (10/19 0950) Pulse Rate:  [93-105] 94  (10/19 0950) Resp:  [16-18] 18  (10/19 0950) BP: (114-131)/(51-88) 131/78 mmHg (10/19 0950) SpO2:  [92 %-97 %] 97 % (10/19 0950) Weight:  [73.8 kg (162 lb 11.2 oz)] 73.8 kg (162 lb 11.2 oz) (10/19 0528)  Intake/Output from previous day: 10/18 0701 - 10/19 0700 In: 2223 [P.O.:960; I.V.:1263] Out: 300  Intake/Output from this shift:    Physical Exam: Neck supple no JVD no bruit no lymph nodes Lungs clear to auscultation without rhonchi or rales  Cardiovascular exam  S1-S2 normal there is no murmur gallop or rub Abdomen soft bowel sounds present nontender Extremities  no clubbing cyanosis or edema       Lab Results:  Basename 07/12/12 0402 07/11/12 0500  WBC 6.1 7.9  HGB 8.6* 8.0*  PLT 204 214    Basename 07/12/12 0402 07/11/12 0500  NA 137 138  K 4.0 3.8  CL 102 104  CO2 26 27  GLUCOSE 104* 86  BUN 3* 3*  CREATININE 0.55 0.61   No results found for this basename: TROPONINI:2,CK,MB:2 in the last 72 hours Hepatic Function Panel  Basename 07/12/12 0402  PROT 6.3  ALBUMIN 2.7*  AST 77*  ALT 43*  ALKPHOS 150*  BILITOT 5.3*  BILIDIR --  IBILI --   No results found for this basename: CHOL in the last 72 hours No results found for this basename: PROTIME in the last 72 hours  Imaging: Imaging results have been reviewed and No results found.  Cardiac Studies:  Assessment/Plan:  Resolving sickle cell crisis Chronic pain syndrome Secondary hemochromatosis Elevated LFTs Leukocytosis Anemia Anxiety disorder Plan Continue present management  LOS: 7 days    Viktoriya Glaspy N 07/12/2012, 3:47 PM

## 2012-07-13 LAB — TYPE AND SCREEN
Unit division: 0
Unit division: 0
Unit division: 0
Unit division: 0

## 2012-07-13 LAB — CBC WITH DIFFERENTIAL/PLATELET
Basophils Relative: 1 % (ref 0–1)
Eosinophils Relative: 3 % (ref 0–5)
Hemoglobin: 8.7 g/dL — ABNORMAL LOW (ref 12.0–15.0)
Lymphs Abs: 2.8 10*3/uL (ref 0.7–4.0)
MCH: 33.6 pg (ref 26.0–34.0)
MCV: 92.7 fL (ref 78.0–100.0)
Neutrophils Relative %: 34 % — ABNORMAL LOW (ref 43–77)
RBC: 2.59 MIL/uL — ABNORMAL LOW (ref 3.87–5.11)

## 2012-07-13 LAB — CULTURE, BLOOD (ROUTINE X 2): Culture: NO GROWTH

## 2012-07-13 LAB — COMPREHENSIVE METABOLIC PANEL
AST: 74 U/L — ABNORMAL HIGH (ref 0–37)
Albumin: 2.8 g/dL — ABNORMAL LOW (ref 3.5–5.2)
Calcium: 8.6 mg/dL (ref 8.4–10.5)
Creatinine, Ser: 0.6 mg/dL (ref 0.50–1.10)
Total Protein: 6.8 g/dL (ref 6.0–8.3)

## 2012-07-13 NOTE — Progress Notes (Signed)
Subjective:  Patient complains of back and leg pain grade 7/10. Denies any chest pain shortness of breath  Objective:  Vital Signs in the last 24 hours: Temp:  [98.1 F (36.7 C)-98.8 F (37.1 C)] 98.2 F (36.8 C) (10/20 1000) Pulse Rate:  [69-101] 69  (10/20 1000) Resp:  [16-18] 18  (10/20 1000) BP: (115-124)/(60-71) 115/65 mmHg (10/20 1000) SpO2:  [94 %-100 %] 94 % (10/20 1000)  Intake/Output from previous day: 10/19 0701 - 10/20 0700 In: 2851.7 [P.O.:1780; I.V.:1071.7] Out: -  Intake/Output from this shift: Total I/O In: 480 [P.O.:480] Out: -   Physical Exam: Neck: no adenopathy, no carotid bruit, no JVD and supple, symmetrical, trachea midline Lungs: clear to auscultation bilaterally Heart: regular rate and rhythm, S1, S2 normal, no murmur, click, rub or gallop Abdomen: soft, non-tender; bowel sounds normal; no masses,  no organomegaly Extremities: extremities normal, atraumatic, no cyanosis or edema  Lab Results:  Basename 07/13/12 0425 07/12/12 0402  WBC 6.2 6.1  HGB 8.7* 8.6*  PLT 181 204    Basename 07/13/12 0425 07/12/12 0402  NA 137 137  K 4.0 4.0  CL 101 102  CO2 28 26  GLUCOSE 99 104*  BUN 3* 3*  CREATININE 0.60 0.55   No results found for this basename: TROPONINI:2,CK,MB:2 in the last 72 hours Hepatic Function Panel  Basename 07/13/12 0425  PROT 6.8  ALBUMIN 2.8*  AST 74*  ALT 38*  ALKPHOS 152*  BILITOT 5.1*  BILIDIR --  IBILI --   No results found for this basename: CHOL in the last 72 hours No results found for this basename: PROTIME in the last 72 hours  Imaging: Imaging results have been reviewed and No results found.  Cardiac Studies:  Assessment/Plan:  Resolving sickle cell crisis  Chronic pain syndrome  Secondary hemochromatosis  Elevated LFTs  Anemia  Anxiety disorder Plan Continue present management   LOS: 8 days    Jen Eppinger N 07/13/2012, 1:13 PM

## 2012-07-14 LAB — BILIRUBIN, FRACTIONATED(TOT/DIR/INDIR)
Bilirubin, Direct: 1.8 mg/dL — ABNORMAL HIGH (ref 0.0–0.3)
Indirect Bilirubin: 3.5 mg/dL — ABNORMAL HIGH (ref 0.3–0.9)
Total Bilirubin: 5.3 mg/dL — ABNORMAL HIGH (ref 0.3–1.2)

## 2012-07-14 LAB — COMPREHENSIVE METABOLIC PANEL
ALT: 37 U/L — ABNORMAL HIGH (ref 0–35)
AST: 80 U/L — ABNORMAL HIGH (ref 0–37)
Albumin: 2.9 g/dL — ABNORMAL LOW (ref 3.5–5.2)
Alkaline Phosphatase: 166 U/L — ABNORMAL HIGH (ref 39–117)
GFR calc Af Amer: >90 mL/min (ref >90)
Glucose, Bld: 107 mg/dL — ABNORMAL HIGH (ref 70–99)
Potassium: 4.1 mEq/L (ref 3.5–5.1)
Sodium: 136 mEq/L (ref 135–145)
Total Protein: 7 g/dL (ref 6.0–8.3)

## 2012-07-14 LAB — CBC WITH DIFFERENTIAL/PLATELET
Band Neutrophils: 0 % (ref 0–10)
Basophils Absolute: 0 10*3/uL (ref 0.0–0.1)
Blasts: 0 %
HCT: 23 % — ABNORMAL LOW (ref 36.0–46.0)
MCH: 33.9 pg (ref 26.0–34.0)
MCHC: 36.5 g/dL — ABNORMAL HIGH (ref 30.0–36.0)
MCV: 92.7 fL (ref 78.0–100.0)
Metamyelocytes Relative: 0 %
Myelocytes: 0 %
Promyelocytes Absolute: 0 %
RDW: 24 % — ABNORMAL HIGH (ref 11.5–15.5)

## 2012-07-14 MED ORDER — DIPHENHYDRAMINE HCL 50 MG/ML IJ SOLN
25.0000 mg | Freq: Once | INTRAMUSCULAR | Status: AC
  Administered 2012-07-14: 25 mg via INTRAVENOUS
  Filled 2012-07-14: qty 1

## 2012-07-14 MED ORDER — DEXTROSE 5 % IV SOLN
2000.0000 mg | Freq: Every day | INTRAVENOUS | Status: DC
  Administered 2012-07-14 – 2012-07-20 (×7): 2000 mg via INTRAVENOUS
  Filled 2012-07-14 (×8): qty 2

## 2012-07-14 MED ORDER — LACTULOSE 10 GM/15ML PO SOLN
20.0000 g | Freq: Two times a day (BID) | ORAL | Status: AC
  Administered 2012-07-14 – 2012-07-16 (×5): 20 g via ORAL
  Filled 2012-07-14 (×6): qty 30

## 2012-07-14 MED ORDER — LACTULOSE 10 GM/15ML PO SOLN: 20.0000 g | Freq: Every day | ORAL | Status: DC | PRN

## 2012-07-14 MED ORDER — OXYCODONE HCL 5 MG PO TABS
10.0000 mg | ORAL_TABLET | ORAL | Status: DC | PRN
  Administered 2012-07-19 – 2012-07-20 (×3): 10 mg via ORAL
  Filled 2012-07-14 (×4): qty 2

## 2012-07-14 MED ORDER — ALTEPLASE 100 MG IV SOLR
2.0000 mg | Freq: Once | INTRAVENOUS | Status: AC
Start: 2012-07-14 — End: 2012-07-14
  Administered 2012-07-14: 2 mg
  Filled 2012-07-14: qty 2

## 2012-07-14 NOTE — Progress Notes (Signed)
Per MD order, blood exchanged done for 300cc of blood. NS 10 cc flushes used (15 in total). Red port a little sluggish with blood return, possible tPA in the next day. Pt tolerated procedure well. RN notified that the exchange is completed.  Dawn [name]

## 2012-07-14 NOTE — Progress Notes (Signed)
Subjective: [name] was seen on rounds today. She is currently sitting up in a chair near the window without acute distress. She is currently afebrile with T-max 99. 4 on yesterday. She continues to have significant amount of pain in her bilateral forearms, and thighs (below knees), and low back. Her pain remains rated 8/10. She also reports increasing abdomal swelling with reports constipation (no BM since Friday), but denies nausea,vomiting, or abdominal pain. She also denies fever, chills, headache, dizziness, chest pain, SOB, or urinary symptoms including dysuria, hematuria, urgency or frequency.  Appetite remains fair with intake poor.   Objective: Vital signs in last 24 hours: Filed Vitals:   07/13/12 1755 07/13/12 2039 07/14/12 0155 07/14/12 0600  BP: 124/68 123/81 122/73 116/58  Pulse: 98 103 99 102  Temp: 98.7 F (37.1 C) 99 F (37.2 C) 98.6 F (37 C) 98.7 F (37.1 C)  TempSrc: Oral Oral Oral Oral  Resp: 16 18 18 16   Height:      Weight:      SpO2: 94% 96% 98% 95%   Weight change:   Intake/Output Summary (Last 24 hours) at 07/14/12 0913 Last data filed at 07/14/12 0600  Gross per 24 hour  Intake 3533.33 ml  Output      0 ml  Net 3533.33 ml    Physical Exam:  General appearance: Alert, cooperative, WN, WD; mild distress  HEENT: Normocephalic, atraumatic, PERRL, mild scleral icterus, no sinus tenderness, oral pharynx moist, pink, patent without exudate, neck supple, trachea midline, no cervical lymphadenopathy, or thyroimegaly.  Back: Symmetric, mild mid/low diffuse lumbarsacral tenderness  Cardio: RRR, S1, S2 normal, no murmur/click/rub/gallop  GI: Soft, non-tender, distended, hypoactive bowel sounds, no organomegaly  Extremities: no cyanosis or edema, tender bilateral upper and lower extremities; left PICC   Pulses: 2+ and symmetric  Skin: Skin color, texture, turgor normal, no rashes or lesions, tattoos on her upper back  Neurologic: A, O x 3, grossly intact, no  focal deficits Psych: Appropriate affect   Lab Results:  [facility] 07/14/12 0510 07/13/12 0425  NA 136 137  K 4.1 4.0  CL 100 101  CO2 28 28  GLUCOSE 107* 99  BUN 4* 3*  CREATININE 0.64 0.60  CALCIUM 8.7 8.6  MG -- --  PHOS -- --    Basename 07/14/12 0510 07/13/12 0425  AST 80* 74*  ALT 37* 38*  ALKPHOS 166* 152*  BILITOT 5.2* 5.1*  PROT 7.0 6.8  ALBUMIN 2.9* 2.8*   No results found for this basename: LIPASE:2,AMYLASE:2 in the last 72 hours  Basename 07/14/12 0510 07/13/12 0425  WBC 7.6 6.2  NEUTROABS 3.3 2.1  HGB 8.4* 8.7*  HCT 23.0* 24.0*  MCV 92.7 92.7  PLT 171 181   No results found for this basename: CKTOTAL:3,CKMB:3,CKMBINDEX:3,TROPONINI:3 in the last 72 hours No components found with this basename: POCBNP:3 No results found for this basename: DDIMER:2 in the last 72 hours No results found for this basename: HGBA1C:2 in the last 72 hours No results found for this basename: CHOL:2,HDL:2,LDLCALC:2,TRIG:2,CHOLHDL:2,LDLDIRECT:2 in the last 72 hours No results found for this basename: TSH,T4TOTAL,FREET3,T3FREE,THYROIDAB in the last 72 hours No results found for this basename: VITAMINB12:2,FOLATE:2,FERRITIN:2,TIBC:2,IRON:2,RETICCTPCT:2 in the last 72 hours  Micro Results: Recent Results (from the past 240 hour(s))  CULTURE, BLOOD (ROUTINE X 2)     Status: Normal   Collection Time   07/06/12  8:40 PM      Component Value Range Status Comment   Specimen Description BLOOD LEFT HAND  Final    Special Requests BOTTLES DRAWN AEROBIC ONLY    Final    Culture  Setup Time 07/07/2012 04:40   Final    Culture NO GROWTH 5 DAYS   Final    Report Status 07/13/2012 FINAL   Final   CULTURE, BLOOD (ROUTINE X 2)     Status: Normal   Collection Time   07/06/12  8:47 PM      Component Value Range Status Comment   Specimen Description BLOOD LEFT HAND   Final    Special Requests BOTTLES DRAWN AEROBIC ONLY    Final    Culture  Setup Time 07/07/2012 04:40   Final     Culture NO GROWTH 5 DAYS   Final    Report Status 07/13/2012 FINAL   Final   URINE CULTURE     Status: Normal   Collection Time   07/07/12 12:32 PM      Component Value Range Status Comment   Specimen Description URINE, CLEAN CATCH   Final    Special Requests NONE   Final    Culture  Setup Time 07/08/2012 01:26   Final    Colony Count 20,OOO COLONIES/ML   Final    Culture     Final    Value: Multiple bacterial morphotypes present, none predominant. Suggest appropriate recollection if clinically indicated.   Report Status 07/09/2012 FINAL   Final   CULTURE, BLOOD (ROUTINE X 2)     Status: Normal (Preliminary result)   Collection Time   07/09/12  5:20 PM      Component Value Range Status Comment   Specimen Description BLOOD RIGHT HAND   Final    Special Requests BOTTLES DRAWN AEROBIC AND ANAEROBIC 5CC   Final    Culture  Setup Time 07/09/2012 22:43   Final    Culture     Final    Value:        BLOOD CULTURE RECEIVED NO GROWTH TO DATE CULTURE WILL BE HELD FOR 5 DAYS BEFORE ISSUING A FINAL NEGATIVE REPORT   Report Status PENDING   Incomplete   CULTURE, BLOOD (ROUTINE X 2)     Status: Normal (Preliminary result)   Collection Time   07/09/12  5:22 PM      Component Value Range Status Comment   Specimen Description BLOOD RIGHT HAND   Final    Special Requests BOTTLES DRAWN AEROBIC AND ANAEROBIC 5.5CC   Final    Culture  Setup Time 07/09/2012 22:43   Final    Culture     Final    Value:        BLOOD CULTURE RECEIVED NO GROWTH TO DATE CULTURE WILL BE HELD FOR 5 DAYS BEFORE ISSUING A FINAL NEGATIVE REPORT   Report Status PENDING   Incomplete   URINE CULTURE     Status: Normal   Collection Time   07/09/12  6:05 PM      Component Value Range Status Comment   Specimen Description URINE, CATHETERIZED   Final    Special Requests cipro   Final    Culture  Setup Time 07/10/2012 01:45   Final    Colony Count NO GROWTH   Final    Culture NO GROWTH   Final    Report Status 07/10/2012 FINAL    Final     Studies/Results: No results found.  Medications: Scheduled Meds:    . docusate sodium  100 mg Oral Daily  . enoxaparin  40 mg  Subcutaneous Q24H  . folic acid  1 mg Oral Daily  . hydroxyurea  500 mg Oral QODAY   And  . hydroxyurea  1,000 mg Oral QODAY  . loratadine  10 mg Oral Daily  . OxyCODONE  10 mg Oral Q12H  . pantoprazole  40 mg Oral Daily  . potassium chloride  30 mEq Oral BID  . sodium chloride  10-40 mL Intracatheter Q12H  . Vitamin D (Ergocalciferol)  50,000 Units Oral Q7 days   Continuous Infusions:    . dextrose 5 % and 0.45% NaCl 100 mL/hr at 07/14/12 0239   PRN Meds:.ALPRAZolam, diphenhydrAMINE, diphenhydrAMINE, HYDROmorphone (DILAUDID) injection, ibuprofen, lactulose, promethazine, sodium chloride  Assessment/Plan:  1) Sickle cell crisis: SS Disease, T. Bili 5.2 this am (has been > 5 since last exchange on Friday 07/11/12 and evidence ongoing active hemolysis. Still significant amount of pain and remains unmanageable at home; will schedule another exchange/transfusion today; continue gentle IV hydration, IV pain meds for breakthrough pain, antiemetic, anti-pruretic, and bowel management regimen;  Lovenox for DVT prophylaxis. Repeat Hgb electrophoresis on 07/07/12 (prior to recent exchanges) with Hgb A 9.4, Hgb F 9.3, and Hgb S 78.0% (not much difference in 03/24/12 panel); continue Hydrea and Folic acid; follow  2) Hypokalemia- resolved with K levels 4.0 or > since 07/12/12; continue same and follow 3) Anemia: Hgb 8.4 this am; schedule exchange/transfusion today (remove 300cc/transfuse 1 unit); follow.  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; previously 1266 on 03/24/12; has questionable allergy to  Desferal (rash); with need for another exchange, noting risk vs benefit, will attempt restart Desferal (pre-medicate); then continue Exjade po daily when discharged; follow  6) Elevated LFT's/Mild thrombocytopenia- AST 80, ALT 37 with Alk P 166 this am (AST 95, Alk P 150 on adm); felt secondary to sickle cell crisis; no organomegaly appreciated; negative KUB this adm; get Fractionated Bili; follow  7) Leukocytosis, fever of unknown origin - resolving with WBC 7.6 this am;  T-max 99.4 yesterday; currently afebrile; continue encourage Incentive Spirometry; 10/16 urine Cx negative and preliminary repeat Blx Cx NTD; follow 8) RLE, bilateral hand pain/swelling- duplex scan without evidence of deep vein thrombosis RLE; on Lovenox VTE; 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; will continue to encourage limb elevation noting low albumin as well; monitor circulation closely; follow 9) Malnutrition- albumin 2.9 this am; felt multifactorial secondary acute crisis, abnormal LFT's, and chronic proteinuria; continue high protein diet (add 80-90 gm); follow 10) Vitamin D Deficiency- 25-OH level 14; now on Ergocalciferol 50,000 IU weekly x 12; repeat level 3 months outpatient setting 11) Constipation- no BM since 07/11/12 on colace BID and prn Lactulose; will schedule Lactulose BID x 3 days; follow 11) Proteinuria: chronic proteinuria (since 2008 per patient); unable to tolerated ACEI (cough); has normal kidney function (creatinine level 0.61); spot protein/creatinine ratio 1.8 on 07/09/12; however BP's remain soft, suspect secondary to pain meds; consider low dose ARB; will need re-evaluation either prior to discharge or when seen f/u outpt setting  12) Disposition- discharge when evidence of crisis resolution and able to manage her pain at home    LOS: 9 days  Lizbeth Bark Pager [phone] 07/14/2012, 9:13 AM

## 2012-07-15 LAB — COMPREHENSIVE METABOLIC PANEL
AST: 93 U/L — ABNORMAL HIGH (ref 0–37)
Albumin: 2.7 g/dL — ABNORMAL LOW (ref 3.5–5.2)
BUN: 4 mg/dL — ABNORMAL LOW (ref 6–23)
Chloride: 102 mEq/L (ref 96–112)
Creatinine, Ser: 0.62 mg/dL (ref 0.50–1.10)
Potassium: 4 mEq/L (ref 3.5–5.1)
Total Bilirubin: 4.6 mg/dL — ABNORMAL HIGH (ref 0.3–1.2)
Total Protein: 6.6 g/dL (ref 6.0–8.3)

## 2012-07-15 LAB — CBC WITH DIFFERENTIAL/PLATELET
Basophils Relative: 1 % (ref 0–1)
Eosinophils Relative: 5 % (ref 0–5)
HCT: 22.9 % — ABNORMAL LOW (ref 36.0–46.0)
Hemoglobin: 8.4 g/dL — ABNORMAL LOW (ref 12.0–15.0)
Lymphs Abs: 2.3 10*3/uL (ref 0.7–4.0)
MCH: 32.7 pg (ref 26.0–34.0)
MCV: 89.1 fL (ref 78.0–100.0)
Monocytes Absolute: 1 10*3/uL (ref 0.1–1.0)
Monocytes Relative: 15 % — ABNORMAL HIGH (ref 3–12)
Neutro Abs: 2.7 10*3/uL (ref 1.7–7.7)
Platelets: 153 10*3/uL (ref 150–400)
RBC: 2.57 MIL/uL — ABNORMAL LOW (ref 3.87–5.11)
WBC: 6.4 10*3/uL (ref 4.0–10.5)

## 2012-07-15 LAB — CULTURE, BLOOD (ROUTINE X 2)
Culture: NO GROWTH
Culture: NO GROWTH

## 2012-07-15 NOTE — Progress Notes (Signed)
Subjective: [name] was seen on rounds today. She reports feeling a little better today. She had a good BM last night, tolerated IV Desferal, and some improvement in her pain s/p exchange transfusion yesterday and resuming Oxy IR prn.  She continues to have pain in her bilateral forearms, thighs, and low back. Her pain is now rated 6/10. She denies nausea,vomiting, abdominal pain, chills, headache, dizziness, chest pain, SOB, or urinary symptoms including dysuria, hematuria, urgency, frequency, and now constipation. Appetite remains fair with intake poor.   Objective: Vital signs in last 24 hours: Filed Vitals:   07/14/12 2245 07/15/12 0209 07/15/12 0615 07/15/12 0945  BP: 116/70 120/62 130/77 118/71  Pulse: 70 103 101 112  Temp: 99 F (37.2 C) 98 F (36.7 C) 98.3 F (36.8 C) 99.1 F (37.3 C)  TempSrc: Oral Oral Oral Oral  Resp: 18 18 18 18   Height:      Weight:      SpO2: 95% 94% 96% 95%   Weight change:   Intake/Output Summary (Last 24 hours) at 07/15/12 1110 Last data filed at 07/15/12 0945  Gross per 24 hour  Intake 3360.83 ml  Output    300 ml  Net 3060.83 ml    Physical Exam:  General appearance: Alert, cooperative, WN, WD; mild distress  HEENT: Normocephalic, atraumatic, PERRL, mild scleral icterus, no sinus tenderness, oral pharynx moist, pink, patent without exudate, neck supple, trachea midline, no cervical lymphadenopathy, or thyroimegaly.  Back: Symmetric, mild mid/low diffuse lumbarsacral tenderness  Cardio: RRR, S1, S2 normal, no murmur/click/rub/gallop  GI: Soft, non-tender, distended, hypoactive bowel sounds, no organomegaly  Extremities: no cyanosis or edema, tender bilateral upper and lower extremities; left PICC   Pulses: 2+ and symmetric  Skin: Skin color, texture, turgor normal, no rashes or lesions, tattoos on her upper back  Neurologic: A, O x 3, grossly intact, no focal deficits Psych: Appropriate affect   Lab Results:  Basename 07/15/12 0500  07/14/12 0510  NA 137 136  K 4.0 4.1  CL 102 100  CO2 29 28  GLUCOSE 93 107*  BUN 4* 4*  CREATININE 0.62 0.64  CALCIUM 8.4 8.7  MG -- --  PHOS -- --    Basename 07/15/12 0500 07/14/12 0510  AST 93* 80*  ALT 41* 37*  ALKPHOS 165* 166*  BILITOT 4.6* 5.2*5.3*  PROT 6.6 7.0  ALBUMIN 2.7* 2.9*   No results found for this basename: LIPASE:2,AMYLASE:2 in the last 72 hours  Basename 07/15/12 0500 07/14/12 0510  WBC 6.4 7.6  NEUTROABS 2.7 3.3  HGB 8.4* 8.4*  HCT 22.9* 23.0*  MCV 89.1 92.7  PLT 153 171   No results found for this basename: CKTOTAL:3,CKMB:3,CKMBINDEX:3,TROPONINI:3 in the last 72 hours No components found with this basename: POCBNP:3 No results found for this basename: DDIMER:2 in the last 72 hours No results found for this basename: HGBA1C:2 in the last 72 hours No results found for this basename: CHOL:2,HDL:2,LDLCALC:2,TRIG:2,CHOLHDL:2,LDLDIRECT:2 in the last 72 hours No results found for this basename: TSH,T4TOTAL,FREET3,T3FREE,THYROIDAB in the last 72 hours No results found for this basename: VITAMINB12:2,FOLATE:2,FERRITIN:2,TIBC:2,IRON:2,RETICCTPCT:2 in the last 72 hours  Micro Results: Recent Results (from the past 240 hour(s))  CULTURE, BLOOD (ROUTINE X 2)     Status: Normal   Collection Time   07/06/12  8:40 PM      Component Value Range Status Comment   Specimen Description BLOOD LEFT HAND   Final    Special Requests BOTTLES DRAWN AEROBIC ONLY   Final    Culture  Setup Time 07/07/2012 04:40   Final    Culture NO GROWTH 5 DAYS   Final    Report Status 07/13/2012 FINAL   Final   CULTURE, BLOOD (ROUTINE X 2)     Status: Normal   Collection Time   07/06/12  8:47 PM      Component Value Range Status Comment   Specimen Description BLOOD LEFT HAND   Final    Special Requests BOTTLES DRAWN AEROBIC ONLY    Final    Culture  Setup Time 07/07/2012 04:40   Final    Culture NO GROWTH 5 DAYS   Final    Report Status 07/13/2012 FINAL   Final     URINE CULTURE     Status: Normal   Collection Time   07/07/12 12:32 PM      Component Value Range Status Comment   Specimen Description URINE, CLEAN CATCH   Final    Special Requests NONE   Final    Culture  Setup Time 07/08/2012 01:26   Final    Colony Count 20,OOO COLONIES/ML   Final    Culture     Final    Value: Multiple bacterial morphotypes present, none predominant. Suggest appropriate recollection if clinically indicated.   Report Status 07/09/2012 FINAL   Final   CULTURE, BLOOD (ROUTINE X 2)     Status: Normal   Collection Time   07/09/12  5:20 PM      Component Value Range Status Comment   Specimen Description BLOOD RIGHT HAND   Final    Special Requests BOTTLES DRAWN AEROBIC AND ANAEROBIC 5CC   Final    Culture  Setup Time 07/09/2012 22:43   Final    Culture NO GROWTH 5 DAYS   Final    Report Status 07/15/2012 FINAL   Final   CULTURE, BLOOD (ROUTINE X 2)     Status: Normal   Collection Time   07/09/12  5:22 PM      Component Value Range Status Comment   Specimen Description BLOOD RIGHT HAND   Final    Special Requests BOTTLES DRAWN AEROBIC AND ANAEROBIC 5.5CC   Final    Culture  Setup Time 07/09/2012 22:43   Final    Culture NO GROWTH 5 DAYS   Final    Report Status 07/15/2012 FINAL   Final   URINE CULTURE     Status: Normal   Collection Time   07/09/12  6:05 PM      Component Value Range Status Comment   Specimen Description URINE, CATHETERIZED   Final    Special Requests cipro   Final    Culture  Setup Time 07/10/2012 01:45   Final    Colony Count NO GROWTH   Final    Culture NO GROWTH   Final    Report Status 07/10/2012 FINAL   Final     Studies/Results: No results found.  Medications: Scheduled Meds:    . alteplase  2 mg Intracatheter Once  . deferoxamine (DESFERAL) IV  2,000 mg Intravenous QHS  . diphenhydrAMINE  25 mg Intravenous Once  . diphenhydrAMINE  25 mg Intravenous Once  . docusate sodium  100 mg Oral Daily  . enoxaparin  40 mg  Subcutaneous Q24H  . folic acid  1 mg Oral Daily  . hydroxyurea  500 mg Oral QODAY   And  . hydroxyurea  1,000 mg Oral QODAY  . lactulose  20 g Oral  BID  . loratadine  10 mg Oral Daily  . OxyCODONE  10 mg Oral Q12H  . pantoprazole  40 mg Oral Daily  . potassium chloride  30 mEq Oral BID  . sodium chloride  10-40 mL Intracatheter Q12H  . Vitamin D (Ergocalciferol)  50,000 Units Oral Q7 days   Continuous Infusions:    . dextrose 5 % and 0.45% NaCl 100 mL/hr at 07/14/12 1228   PRN Meds:.ALPRAZolam, diphenhydrAMINE, diphenhydrAMINE, HYDROmorphone (DILAUDID) injection, ibuprofen, lactulose, oxyCODONE, promethazine, sodium chloride  Assessment/Plan:  1) Sickle cell crisis: SS Disease, T. Bili 4.6 this am s/p exchange/transfusion on yesterday; evidence ongoing active hemolysis. Still significant amount of pain and remains unmanageable at home; will continue gentle IV hydration, IV pain meds for breakthrough pain, antiemetic, anti-pruretic, and bowel management regimen;  Lovenox for DVT prophylaxis. Repeat Hgb electrophoresis on 07/07/12 (prior to recent exchanges) with Hgb A 9.4, Hgb F 9.3, and Hgb S 78.0%; continue Hydrea and Folic acid; follow  2) Hypokalemia- resolved with K levels 4.0 or > since 07/12/12; continue same and follow 3) Anemia: Hgb 8.4 this am; stable s/p echange/transfusion yesterday; follow.  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; tolerated Desferal IV last night and will continue Exjade po daily when discharged; follow  6) Elevated LFT's/Mild thrombocytopenia- AST 93, ALT 41 with Alk P 165, Plt 153 this am; felt secondary to sickle cell crisis; Fractionated Bili (Direct 1.8, indirect 3.5 and Total 5.3) on yesterday; abdomen NT without  organomegaly appreciated; follow for now  7) Leukocytosis, fever of unknown origin - resolving with WBC 6.4 this am; low grade temp  99.1 persists; suspect secondary to inflammatory process SCD and ongoing sequestration; continue encouraging Incentive Spirometry use; follow 8) Malnutrition- albumin 2.7 this am; felt multifactorial secondary acute crisis, abnormal LFT's, and chronic proteinuria; continue high protein diet (add 80-90 gm); follow 10) Vitamin D Deficiency- 25-OH level 14 this adm; continue Ergocalciferol 50,000 IU weekly x 12; repeat level 3 months outpatient setting 11) Constipation- resolving with colace BID, Lactulose BID x 3 days then prn; follow 11) Proteinuria: chronic proteinuria (since 2008 per patient); unable to tolerated ACEI (cough); has normal kidney function (creatinine level 0.61); spot protein/creatinine ratio 1.8 on 07/09/12; however BP's remain soft, suspect secondary to pain meds; will need to consider low dose ARB when seen f/u outpt setting  12) Disposition- discharge hopefully within next 24-48 hours (able to manage her pain at home)    LOS: 10 days  Lizbeth Bark Pager [phone] 07/15/2012, 11:10 AM

## 2012-07-16 LAB — CBC WITH DIFFERENTIAL/PLATELET
Basophils Relative: 2 % — ABNORMAL HIGH (ref 0–1)
Eosinophils Absolute: 0.3 10*3/uL (ref 0.0–0.7)
Eosinophils Relative: 5 % (ref 0–5)
Hemoglobin: 8.5 g/dL — ABNORMAL LOW (ref 12.0–15.0)
Lymphocytes Relative: 43 % (ref 12–46)
MCH: 32.2 pg (ref 26.0–34.0)
Monocytes Absolute: 1.2 10*3/uL — ABNORMAL HIGH (ref 0.1–1.0)
Neutrophils Relative %: 32 % — ABNORMAL LOW (ref 43–77)
Platelets: 187 10*3/uL (ref 150–400)
RBC: 2.64 MIL/uL — ABNORMAL LOW (ref 3.87–5.11)

## 2012-07-16 LAB — COMPREHENSIVE METABOLIC PANEL
AST: 92 U/L — ABNORMAL HIGH (ref 0–37)
BUN: 4 mg/dL — ABNORMAL LOW (ref 6–23)
CO2: 27 mEq/L (ref 19–32)
Calcium: 8.6 mg/dL (ref 8.4–10.5)
Chloride: 100 mEq/L (ref 96–112)
Creatinine, Ser: 0.58 mg/dL (ref 0.50–1.10)
GFR calc Af Amer: >90 mL/min (ref >90)
GFR calc non Af Amer: >90 mL/min (ref >90)
Glucose, Bld: 107 mg/dL — ABNORMAL HIGH (ref 70–99)
Total Bilirubin: 3.7 mg/dL — ABNORMAL HIGH (ref 0.3–1.2)

## 2012-07-16 MED ORDER — DIPHENHYDRAMINE HCL 50 MG/ML IJ SOLN
25.0000 mg | Freq: Once | INTRAMUSCULAR | Status: AC
  Administered 2012-07-17: 25 mg via INTRAVENOUS
  Filled 2012-07-16 (×2): qty 1

## 2012-07-16 NOTE — Progress Notes (Signed)
Subjective: [name] was seen on rounds today. She's sitting up eating lunch. Appetite remains fair with a poor intake. Encouraged to eat high protein food choices as much as possible. She is still in a significant amount of pain despite evidence slowly resolving crisis but wants to be discharged no later than Friday. She continues to have pain in her bilateral forearms, thighs, and low back and unchanged from previous assessments. Her pain remains rated 6/10. She currently denies nausea,vomiting, abdominal pain, constipation, chills, headache, dizziness, chest pain, SOB, and changes to her bowel or bladder functioning. Family (Mom) and family friend are currently visiting at bedside.   Objective: Vital signs in last 24 hours: Filed Vitals:   07/15/12 2110 07/16/12 0210 07/16/12 0528 07/16/12 1001  BP: 131/63 107/67 105/55 130/74  Pulse: 110 100 102 105  Temp: 98.6 F (37 C) 98.3 F (36.8 C) 98.1 F (36.7 C) 98.4 F (36.9 C)  TempSrc: Oral Oral Oral Oral  Resp: 18 18 16 16   Height:      Weight:      SpO2: 92% 93% 92% 95%   Weight change:   Intake/Output Summary (Last 24 hours) at 07/16/12 1156 Last data filed at 07/15/12 1450  Gross per 24 hour  Intake 2741.67 ml  Output      0 ml  Net 2741.67 ml    Physical Exam:  General appearance: Alert, cooperative, WN, WD; mild distress  HEENT: Normocephalic, atraumatic, PERRL, mild scleral icterus, no sinus tenderness, oral pharynx moist, pink, patent without exudate, neck supple, trachea midline, no cervical lymphadenopathy, or thyroimegaly.  Back: Symmetric, mild mid/low diffuse lumbarsacral tenderness  Cardio: RRR, S1, S2 normal, no murmur/click/rub/gallop  GI: Soft, non-tender, distended, hypoactive bowel sounds, no organomegaly  Extremities: no cyanosis or edema, tender bilateral upper and lower extremities; left PICC   Pulses: 2+ and symmetric  Skin: Skin color, texture, turgor normal, no rashes or lesions, tattoos on her upper  back  Neurologic: A, O x 3, grossly intact, no focal deficits Psych: Appropriate affect   Lab Results:  Basename 07/16/12 0600 07/15/12 0500  NA 134* 137  K 3.8 4.0  CL 100 102  CO2 27 29  GLUCOSE 107* 93  BUN 4* 4*  CREATININE 0.58 0.62  CALCIUM 8.6 8.4  MG -- --  PHOS -- --    Basename 07/16/12 0600 07/15/12 0500  AST 92* 93*  ALT 40* 41*  ALKPHOS 158* 165*  BILITOT 3.7* 4.6*  PROT 6.5 6.6  ALBUMIN 2.7* 2.7*   No results found for this basename: LIPASE:2,AMYLASE:2 in the last 72 hours  Basename 07/16/12 0600 07/15/12 0500  WBC 6.6 6.4  NEUTROABS 2.1 2.7  HGB 8.5* 8.4*  HCT 23.9* 22.9*  MCV 90.5 89.1  PLT 187 153   No results found for this basename: CKTOTAL:3,CKMB:3,CKMBINDEX:3,TROPONINI:3 in the last 72 hours No components found with this basename: POCBNP:3 No results found for this basename: DDIMER:2 in the last 72 hours No results found for this basename: HGBA1C:2 in the last 72 hours No results found for this basename: CHOL:2,HDL:2,LDLCALC:2,TRIG:2,CHOLHDL:2,LDLDIRECT:2 in the last 72 hours No results found for this basename: TSH,T4TOTAL,FREET3,T3FREE,THYROIDAB in the last 72 hours No results found for this basename: VITAMINB12:2,FOLATE:2,FERRITIN:2,TIBC:2,IRON:2,RETICCTPCT:2 in the last 72 hours  Micro Results: Recent Results (from the past 240 hour(s))  CULTURE, BLOOD (ROUTINE X 2)     Status: Normal   Collection Time   07/06/12  8:40 PM      Component Value Range Status Comment  Specimen Description BLOOD LEFT HAND   Final    Special Requests BOTTLES DRAWN AEROBIC ONLY    Final    Culture  Setup Time 07/07/2012 04:40   Final    Culture NO GROWTH 5 DAYS   Final    Report Status 07/13/2012 FINAL   Final   CULTURE, BLOOD (ROUTINE X 2)     Status: Normal   Collection Time   07/06/12  8:47 PM      Component Value Range Status Comment   Specimen Description BLOOD LEFT HAND   Final    Special Requests BOTTLES DRAWN AEROBIC ONLY    Final     Culture  Setup Time 07/07/2012 04:40   Final    Culture NO GROWTH 5 DAYS   Final    Report Status 07/13/2012 FINAL   Final   URINE CULTURE     Status: Normal   Collection Time   07/07/12 12:32 PM      Component Value Range Status Comment   Specimen Description URINE, CLEAN CATCH   Final    Special Requests NONE   Final    Culture  Setup Time 07/08/2012 01:26   Final    Colony Count 20,OOO COLONIES/ML   Final    Culture     Final    Value: Multiple bacterial morphotypes present, none predominant. Suggest appropriate recollection if clinically indicated.   Report Status 07/09/2012 FINAL   Final   CULTURE, BLOOD (ROUTINE X 2)     Status: Normal   Collection Time   07/09/12  5:20 PM      Component Value Range Status Comment   Specimen Description BLOOD RIGHT HAND   Final    Special Requests BOTTLES DRAWN AEROBIC AND ANAEROBIC 5CC   Final    Culture  Setup Time 07/09/2012 22:43   Final    Culture NO GROWTH 5 DAYS   Final    Report Status 07/15/2012 FINAL   Final   CULTURE, BLOOD (ROUTINE X 2)     Status: Normal   Collection Time   07/09/12  5:22 PM      Component Value Range Status Comment   Specimen Description BLOOD RIGHT HAND   Final    Special Requests BOTTLES DRAWN AEROBIC AND ANAEROBIC 5.5CC   Final    Culture  Setup Time 07/09/2012 22:43   Final    Culture NO GROWTH 5 DAYS   Final    Report Status 07/15/2012 FINAL   Final   URINE CULTURE     Status: Normal   Collection Time   07/09/12  6:05 PM      Component Value Range Status Comment   Specimen Description URINE, CATHETERIZED   Final    Special Requests cipro   Final    Culture  Setup Time 07/10/2012 01:45   Final    Colony Count NO GROWTH   Final    Culture NO GROWTH   Final    Report Status 07/10/2012 FINAL   Final     Studies/Results: No results found.  Medications: Scheduled Meds:    . deferoxamine (DESFERAL) IV  2,000 mg Intravenous QHS  . docusate sodium  100 mg Oral Daily  . enoxaparin  40 mg  Subcutaneous Q24H  . folic acid  1 mg Oral Daily  . hydroxyurea  500 mg Oral QODAY   And  . hydroxyurea  1,000 mg Oral QODAY  . lactulose  20 g Oral BID  . loratadine  10 mg Oral Daily  . OxyCODONE  10 mg Oral Q12H  . pantoprazole  40 mg Oral Daily  . potassium chloride  30 mEq Oral BID  . sodium chloride  10-40 mL Intracatheter Q12H  . Vitamin D (Ergocalciferol)  50,000 Units Oral Q7 days   Continuous Infusions:    . dextrose 5 % and 0.45% NaCl 100 mL/hr at 07/16/12 0602   PRN Meds:.ALPRAZolam, diphenhydrAMINE, diphenhydrAMINE, HYDROmorphone (DILAUDID) injection, ibuprofen, lactulose, oxyCODONE, promethazine, sodium chloride  Assessment/Plan:  1) Sickle cell crisis: SS Disease, slowly resolving with T. Bili 3.7 this am; evidence ongoing active hemolysis; still significant amount of pain and remains unmanageable at home; schedule another exchange/transfusion today (whenever blood available- antibodies); will continue gentle IV hydration, IV pain meds for breakthrough pain, antiemetic, anti-pruretic, and bowel management regimen; Lovenox for DVT prophylaxis. Repeat Hgb electrophoresis on 07/07/12 (prior to recent exchanges) with Hgb A 9.4, Hgb F 9.3, and Hgb S 78.0%; continue Hydrea and Folic acid; follow  2) Anemia: Hgb 8.5 this am; echange/transfusion yesterday; follow.  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 on 07/09/12; tolerated Desferal IV last night and will continue Exjade po daily when discharged; follow  5) Elevated LFT's/Mild thrombocytopenia- AST 92, ALT 40 with Alk P 158, Plt 187 this am; felt secondary to sickle cell crisis; Fractionated Bili (Direct 1.8, indirect 3.5 and Total 5.3) on yesterday; abdomen NT without organomegaly appreciated; cont to  follow for now  6) Malnutrition- albumin 2.7 this am; felt multifactorial secondary acute crisis, abnormal LFT's, and chronic proteinuria; continue high protein diet (add 80-90 gm); follow 7) Vitamin D Deficiency- 25-OH level 14 this adm; continue Ergocalciferol 50,000 IU weekly x 12; repeat level 3 months outpatient setting 8) Constipation- resolving with colace BID, Lactulose BID x 3 days then prn; follow 9) Proteinuria: chronic proteinuria (since 2008 per patient); unable to tolerated ACEI (cough); has normal kidney function (creatinine level 0.61); spot protein/creatinine ratio 1.8 on 07/09/12; however BP's remain soft, suspect secondary to pain meds; will need to consider low dose ARB when seen f/u outpt setting  10) Disposition- discharge hopefully within next 48 hours (pending blood availability for exchange and ability to manage her pain at home)    LOS: 11 days  Dawn [name] Pager [phone] 07/16/2012, 11:56 AM

## 2012-07-17 LAB — CBC WITH DIFFERENTIAL/PLATELET
Basophils Relative: 1 % (ref 0–1)
Eosinophils Relative: 6 % — ABNORMAL HIGH (ref 0–5)
HCT: 22.8 % — ABNORMAL LOW (ref 36.0–46.0)
Lymphs Abs: 2.4 10*3/uL (ref 0.7–4.0)
MCH: 32.3 pg (ref 26.0–34.0)
MCV: 90.8 fL (ref 78.0–100.0)
Monocytes Relative: 17 % — ABNORMAL HIGH (ref 3–12)
Neutro Abs: 2.7 10*3/uL (ref 1.7–7.7)
RBC: 2.51 MIL/uL — ABNORMAL LOW (ref 3.87–5.11)
WBC: 6.7 10*3/uL (ref 4.0–10.5)

## 2012-07-17 LAB — PREPARE RBC (CROSSMATCH)

## 2012-07-17 LAB — COMPREHENSIVE METABOLIC PANEL
AST: 90 U/L — ABNORMAL HIGH (ref 0–37)
Albumin: 2.5 g/dL — ABNORMAL LOW (ref 3.5–5.2)
BUN: 3 mg/dL — ABNORMAL LOW (ref 6–23)
Creatinine, Ser: 0.53 mg/dL (ref 0.50–1.10)
Potassium: 3.8 mEq/L (ref 3.5–5.1)
Total Protein: 6.4 g/dL (ref 6.0–8.3)

## 2012-07-17 NOTE — Progress Notes (Signed)
Subjective:  Patient is gradually feeling better. She still has pain in the lower back, and knees. She rates the pain however as a 6-7/10. She denies chest pains or shortness of breath. She feels she may be able to manage her pain possibly by tomorrow.   Allergies  Allergen Reactions  . Desferal (Deferoxamine) Hives    Local reaction on arm only during infusion.  . Latex Other (See Comments)    REACTION: Pt experiences a burning sensation on contacted skin areas  . Lisinopril Other (See Comments) and Cough    REACTION: Sore/scratchy throat  . Tape Other (See Comments)    REACTION: Pt. Experiences a burning sensation on contacted skin areas  . Tylenol (Acetaminophen) Other (See Comments)    Pt. Does not take the following due to protection of kidney health. Past occurrence of Protein in urine.   Current Facility-Administered Medications  Medication Dose Route Frequency Provider Last Rate Last Dose  . ALPRAZolam Prudy Feeler) tablet 0.25 mg  0.25 mg Oral TID PRN Grayce Sessions, NP   0.25 mg at 07/15/12 2226  . deferoxamine (DESFERAL) 2,000 mg in dextrose 5 % 500 mL infusion  2,000 mg Intravenous QHS Lizbeth Bark, FNP 83.3 mL/hr at 07/17/12 0035 2,000 mg at 07/17/12 0035  . dextrose 5 %-0.45 % sodium chloride infusion   Intravenous Continuous Lizbeth Bark, FNP 100 mL/hr at 07/17/12 1243 1,000 mL at 07/17/12 1243  . diphenhydrAMINE (BENADRYL) capsule 25-50 mg  25-50 mg Oral Q4H PRN Grayce Sessions, NP   25 mg at 07/17/12 1001   Or  . diphenhydrAMINE (BENADRYL) injection 12.5-25 mg  12.5-25 mg Intravenous Q4H PRN Grayce Sessions, NP   25 mg at 07/17/12 1918  . diphenhydrAMINE (BENADRYL) injection 25 mg  25 mg Intravenous Once Lizbeth Bark, FNP      . docusate sodium (COLACE) capsule 100 mg  100 mg Oral Daily Lizbeth Bark, FNP   100 mg at 07/17/12 1002  . enoxaparin (LOVENOX) injection 40 mg  40 mg Subcutaneous Q24H Gwenyth Bender, MD   40 mg at 07/17/12 1701  . folic acid (FOLVITE) tablet 1 mg   1 mg Oral Daily Grayce Sessions, NP   1 mg at 07/17/12 1002  . HYDROmorphone (DILAUDID) injection 2-4 mg  2-4 mg Intravenous Q2H PRN Lizbeth Bark, FNP   4 mg at 07/17/12 2155  . hydroxyurea (HYDREA) capsule 500 mg  500 mg Oral QODAY Gwenyth Bender, MD   500 mg at 07/16/12 1004   And  . hydroxyurea (HYDREA) capsule 1,000 mg  1,000 mg Oral QODAY Gwenyth Bender, MD   1,000 mg at 07/17/12 1000  . ibuprofen (ADVIL,MOTRIN) tablet 400 mg  400 mg Oral Q6H PRN Lizbeth Bark, FNP   400 mg at 07/17/12 1441  . lactulose (CHRONULAC) 10 GM/15ML solution 20 g  20 g Oral Daily PRN Lizbeth Bark, FNP      . lactulose (CHRONULAC) 10 GM/15ML solution 20 g  20 g Oral BID Lizbeth Bark, FNP   20 g at 07/16/12 2149  . loratadine (CLARITIN) tablet 10 mg  10 mg Oral Daily Grayce Sessions, NP   10 mg at 07/17/12 1002  . oxyCODONE (Oxy IR/ROXICODONE) immediate release tablet 10 mg  10 mg Oral Q4H PRN Lizbeth Bark, FNP      . OxyCODONE (OXYCONTIN) 12 hr tablet 10 mg  10 mg Oral Q12H Gwenyth Bender, MD  10 mg at 07/17/12 2144  . pantoprazole (PROTONIX) EC tablet 40 mg  40 mg Oral Daily Lizbeth Bark, FNP   40 mg at 07/17/12 1002  . potassium chloride (K-DUR,KLOR-CON) CR tablet 30 mEq  30 mEq Oral BID Gwenyth Bender, MD   30 mEq at 07/17/12 2144  . promethazine (PHENERGAN) injection 25 mg  25 mg Intravenous Q4H PRN Grayce Sessions, NP   25 mg at 07/17/12 1701  . sodium chloride 0.9 % injection 10-40 mL  10-40 mL Intracatheter Q12H Gwenyth Bender, MD   10 mL at 07/16/12 2150  . sodium chloride 0.9 % injection 10-40 mL  10-40 mL Intracatheter PRN Gwenyth Bender, MD   150 mL at 07/14/12 1315  . Vitamin D (Ergocalciferol) (DRISDOL) capsule 50,000 Units  50,000 Units Oral Q7 days Lizbeth Bark, FNP   50,000 Units at 07/12/12 1001    Objective: Blood pressure 104/66, pulse 101, temperature 98.8 F (37.1 C), temperature source Oral, resp. rate 16, height 5\' 3"  (1.6 m), weight 159 lb 1.6 oz (72.167 kg), SpO2 98.00%.  Well-developed nourished black  female presently in no acute distress. HEENT:no sinus tenderness. No sclera icterus. NECK:no enlarged thyroid. No posterior cervical nodes. LUNGS:clear to auscultation. Minimal tenderness in lower lumbar sacral spine. UJ:WJXBJY S1, S2 without S3. ABD:no epigastric tenderness. NWG:[id number] in lower lumbar sacral spine. Minimal tenderness in knees. Negative Homans. NEURO:intact.  Lab results: Results for orders placed during the hospital encounter of 07/05/12 (from the past 48 hour(s))  COMPREHENSIVE METABOLIC PANEL     Status: Abnormal   Collection Time   07/16/12  6:00 AM      Component Value Range Comment   Sodium 134 (*) 135 - 145 mEq/L    Potassium 3.8  3.5 - 5.1 mEq/L    Chloride 100  96 - 112 mEq/L    CO2 27  19 - 32 mEq/L    Glucose, Bld 107 (*) 70 - 99 mg/dL    BUN 4 (*) 6 - 23 mg/dL    Creatinine, Ser 7.84  0.50 - 1.10 mg/dL    Calcium 8.6  8.4 - 69.6 mg/dL    Total Protein 6.5  6.0 - 8.3 g/dL    Albumin 2.7 (*) 3.5 - 5.2 g/dL    AST 92 (*) 0 - 37 U/L    ALT 40 (*) 0 - 35 U/L    Alkaline Phosphatase 158 (*) 39 - 117 U/L    Total Bilirubin 3.7 (*) 0.3 - 1.2 mg/dL    GFR calc non Af Amer >90  >90 mL/min    GFR calc Af Amer >90  >90 mL/min   CBC WITH DIFFERENTIAL     Status: Abnormal   Collection Time   07/16/12  6:00 AM      Component Value Range Comment   WBC 6.6  4.0 - 10.5 K/uL    RBC 2.64 (*) 3.87 - 5.11 MIL/uL    Hemoglobin 8.5 (*) 12.0 - 15.0 g/dL    HCT 29.5 (*) 28.4 - 46.0 %    MCV 90.5  78.0 - 100.0 fL    MCH 32.2  26.0 - 34.0 pg    MCHC 35.6  30.0 - 36.0 g/dL    RDW 13.2 (*) 44.0 - 15.5 %    Platelets 187  150 - 400 K/uL    Neutrophils Relative 32 (*) 43 - 77 %    Lymphocytes Relative 43  12 - 46 %  Monocytes Relative 18 (*) 3 - 12 %    Eosinophils Relative 5  0 - 5 %    Basophils Relative 2 (*) 0 - 1 %    Neutro Abs 2.1  1.7 - 7.7 K/uL    Lymphs Abs 2.9  0.7 - 4.0 K/uL    Monocytes Absolute 1.2 (*) 0.1 - 1.0 K/uL    Eosinophils Absolute 0.3   0.0 - 0.7 K/uL    Basophils Absolute 0.1  0.0 - 0.1 K/uL    RBC Morphology POLYCHROMASIA PRESENT      Smear Review LARGE PLATELETS PRESENT   GIANT PLATELETS SEEN  PREPARE RBC (CROSSMATCH)     Status: Normal   Collection Time   07/16/12 12:30 PM      Component Value Range Comment   Order Confirmation ORDER PROCESSED BY BLOOD BANK     COMPREHENSIVE METABOLIC PANEL     Status: Abnormal   Collection Time   07/17/12  5:40 AM      Component Value Range Comment   Sodium 134 (*) 135 - 145 mEq/L    Potassium 3.8  3.5 - 5.1 mEq/L    Chloride 100  96 - 112 mEq/L    CO2 26  19 - 32 mEq/L    Glucose, Bld 117 (*) 70 - 99 mg/dL    BUN 3 (*) 6 - 23 mg/dL    Creatinine, Ser 1.61  0.50 - 1.10 mg/dL    Calcium 8.4  8.4 - 09.6 mg/dL    Total Protein 6.4  6.0 - 8.3 g/dL    Albumin 2.5 (*) 3.5 - 5.2 g/dL    AST 90 (*) 0 - 37 U/L    ALT 39 (*) 0 - 35 U/L    Alkaline Phosphatase 156 (*) 39 - 117 U/L    Total Bilirubin 3.2 (*) 0.3 - 1.2 mg/dL    GFR calc non Af Amer >90  >90 mL/min    GFR calc Af Amer >90  >90 mL/min   CBC WITH DIFFERENTIAL     Status: Abnormal   Collection Time   07/17/12  5:40 AM      Component Value Range Comment   WBC 6.7  4.0 - 10.5 K/uL    RBC 2.51 (*) 3.87 - 5.11 MIL/uL    Hemoglobin 8.1 (*) 12.0 - 15.0 g/dL    HCT 04.5 (*) 40.9 - 46.0 %    MCV 90.8  78.0 - 100.0 fL    MCH 32.3  26.0 - 34.0 pg    MCHC 35.5  30.0 - 36.0 g/dL    RDW 81.1 (*) 91.4 - 15.5 %    Platelets 199  150 - 400 K/uL REPEATED TO VERIFY   Neutrophils Relative 40 (*) 43 - 77 %    Lymphocytes Relative 36  12 - 46 %    Monocytes Relative 17 (*) 3 - 12 %    Eosinophils Relative 6 (*) 0 - 5 %    Basophils Relative 1  0 - 1 %    Neutro Abs 2.7  1.7 - 7.7 K/uL    Lymphs Abs 2.4  0.7 - 4.0 K/uL    Monocytes Absolute 1.1 (*) 0.1 - 1.0 K/uL    Eosinophils Absolute 0.4  0.0 - 0.7 K/uL    Basophils Absolute 0.1  0.0 - 0.1 K/uL    RBC Morphology POLYCHROMASIA PRESENT      Smear Review LARGE PLATELETS PRESENT        Studies/Results:  No results found.  Patient Active Problem List  Diagnosis  . Sickle cell crisis  . Sickle cell disease  . Elevated LFTs  . Sickle cell anemia  . Fever  . Leukocytosis  . HTN (hypertension)  . Hypokalemia  . Hypoalbuminemia  . Bronchitis  . Oral candidiasis  . Reactive depression (situational)    Impression: Sickle cell crisis. Slowly resolving. Status post exchange transfusion. Active hemolysis gradually decreasing. Muscle skeletal pain. Vitamin D deficiency. Presently on replacement.   Plan: Continue present regimen. Followup CMET, CBC. Anticipate discharge home as Her resolution  continues.   August Saucer, Arslan Kier 07/17/2012 11:08 PM

## 2012-07-18 LAB — CBC WITH DIFFERENTIAL/PLATELET
Basophils Absolute: 0.1 10*3/uL (ref 0.0–0.1)
Lymphs Abs: 2.3 10*3/uL (ref 0.7–4.0)
MCV: 90.7 fL (ref 78.0–100.0)
Monocytes Absolute: 1.1 10*3/uL — ABNORMAL HIGH (ref 0.1–1.0)
Monocytes Relative: 17 % — ABNORMAL HIGH (ref 3–12)
Platelets: 191 10*3/uL (ref 150–400)
RDW: 22.2 % — ABNORMAL HIGH (ref 11.5–15.5)
WBC: 6.7 10*3/uL (ref 4.0–10.5)

## 2012-07-18 LAB — TYPE AND SCREEN
ABO/RH(D): A POS
Unit division: 0

## 2012-07-18 LAB — COMPREHENSIVE METABOLIC PANEL
Albumin: 2.5 g/dL — ABNORMAL LOW (ref 3.5–5.2)
Alkaline Phosphatase: 148 U/L — ABNORMAL HIGH (ref 39–117)
BUN: 4 mg/dL — ABNORMAL LOW (ref 6–23)
CO2: 25 mEq/L (ref 19–32)
Chloride: 102 mEq/L (ref 96–112)
GFR calc non Af Amer: >90 mL/min (ref >90)
Glucose, Bld: 109 mg/dL — ABNORMAL HIGH (ref 70–99)
Potassium: 3.8 mEq/L (ref 3.5–5.1)
Total Bilirubin: 3.5 mg/dL — ABNORMAL HIGH (ref 0.3–1.2)

## 2012-07-18 NOTE — Progress Notes (Signed)
Subjective:  Patient not feeling as well as she had hoped. She notes the weather change has caused ongoing pain in her legs. She rates her pain as a 7/10 at this time. Pain is sharp aching in nature. She is not able to manage this present pain at home with oral medications. She denies chest pains or shortness of breath. No other new complaints.   Allergies  Allergen Reactions  . Desferal (Deferoxamine) Hives    Local reaction on arm only during infusion.  . Latex Other (See Comments)    REACTION: Pt experiences a burning sensation on contacted skin areas  . Lisinopril Other (See Comments) and Cough    REACTION: Sore/scratchy throat  . Tape Other (See Comments)    REACTION: Pt. Experiences a burning sensation on contacted skin areas  . Tylenol (Acetaminophen) Other (See Comments)    Pt. Does not take the following due to protection of kidney health. Past occurrence of Protein in urine.   Current Facility-Administered Medications  Medication Dose Route Frequency Provider Last Rate Last Dose  . ALPRAZolam Prudy Feeler) tablet 0.25 mg  0.25 mg Oral TID PRN Grayce Sessions, NP   0.25 mg at 07/17/12 2352  . deferoxamine (DESFERAL) 2,000 mg in dextrose 5 % 500 mL infusion  2,000 mg Intravenous QHS Lizbeth Bark, FNP 83.3 mL/hr at 07/18/12 0033 2,000 mg at 07/18/12 0033  . dextrose 5 %-0.45 % sodium chloride infusion   Intravenous Continuous Lizbeth Bark, FNP 100 mL/hr at 07/18/12 1110 1,000 mL at 07/18/12 1110  . diphenhydrAMINE (BENADRYL) capsule 25-50 mg  25-50 mg Oral Q4H PRN Grayce Sessions, NP   25 mg at 07/17/12 1001   Or  . diphenhydrAMINE (BENADRYL) injection 12.5-25 mg  12.5-25 mg Intravenous Q4H PRN Grayce Sessions, NP   25 mg at 07/18/12 1516  . diphenhydrAMINE (BENADRYL) injection 25 mg  25 mg Intravenous Once Lizbeth Bark, FNP   25 mg at 07/17/12 2346  . docusate sodium (COLACE) capsule 100 mg  100 mg Oral Daily Lizbeth Bark, FNP   100 mg at 07/18/12 1046  . enoxaparin (LOVENOX)  injection 40 mg  40 mg Subcutaneous Q24H Gwenyth Bender, MD   40 mg at 07/17/12 1701  . folic acid (FOLVITE) tablet 1 mg  1 mg Oral Daily Grayce Sessions, NP   1 mg at 07/18/12 1046  . HYDROmorphone (DILAUDID) injection 2-4 mg  2-4 mg Intravenous Q2H PRN Lizbeth Bark, FNP   4 mg at 07/18/12 1516  . hydroxyurea (HYDREA) capsule 500 mg  500 mg Oral QODAY Gwenyth Bender, MD   500 mg at 07/18/12 1047   And  . hydroxyurea (HYDREA) capsule 1,000 mg  1,000 mg Oral QODAY Gwenyth Bender, MD   1,000 mg at 07/17/12 1000  . ibuprofen (ADVIL,MOTRIN) tablet 400 mg  400 mg Oral Q6H PRN Lizbeth Bark, FNP   400 mg at 07/17/12 1441  . lactulose (CHRONULAC) 10 GM/15ML solution 20 g  20 g Oral Daily PRN Lizbeth Bark, FNP      . loratadine (CLARITIN) tablet 10 mg  10 mg Oral Daily Grayce Sessions, NP   10 mg at 07/18/12 1046  . oxyCODONE (Oxy IR/ROXICODONE) immediate release tablet 10 mg  10 mg Oral Q4H PRN Lizbeth Bark, FNP      . OxyCODONE (OXYCONTIN) 12 hr tablet 10 mg  10 mg Oral Q12H Gwenyth Bender, MD   10 mg at  07/18/12 1046  . pantoprazole (PROTONIX) EC tablet 40 mg  40 mg Oral Daily Lizbeth Bark, FNP   40 mg at 07/18/12 1046  . potassium chloride (K-DUR,KLOR-CON) CR tablet 30 mEq  30 mEq Oral BID Gwenyth Bender, MD   30 mEq at 07/18/12 1046  . promethazine (PHENERGAN) injection 25 mg  25 mg Intravenous Q4H PRN Grayce Sessions, NP   25 mg at 07/18/12 1252  . sodium chloride 0.9 % injection 10-40 mL  10-40 mL Intracatheter Q12H Gwenyth Bender, MD   10 mL at 07/18/12 1050  . sodium chloride 0.9 % injection 10-40 mL  10-40 mL Intracatheter PRN Gwenyth Bender, MD   150 mL at 07/14/12 1315  . Vitamin D (Ergocalciferol) (DRISDOL) capsule 50,000 Units  50,000 Units Oral Q7 days Lizbeth Bark, FNP   50,000 Units at 07/12/12 1001    Objective: Blood pressure 129/79, pulse 115, temperature 99.3 F (37.4 C), temperature source Oral, resp. rate 18, height 5\' 3"  (1.6 m), weight 162 lb 1.6 oz (73.528 kg), SpO2 96.00%.  Well-developed  well-nourished black female in moderate distress. HEENT:no sinus tenderness. Positive scleral icterus. NECK:no enlarged thyroid. No posterior cervical nodes. LUNGS:clear to auscultation. No wheezes. NW:GNFAOZ S1, S2 without S3. ABD:no epigastric tenderness. HYQ:MVHQIONGEX in lower extremities anterior thighs and knees. Tenderness and ankles. Negative Homans. NEURO:intact.  Lab results: Results for orders placed during the hospital encounter of 07/05/12 (from the past 48 hour(s))  COMPREHENSIVE METABOLIC PANEL     Status: Abnormal   Collection Time   07/17/12  5:40 AM      Component Value Range Comment   Sodium 134 (*) 135 - 145 mEq/L    Potassium 3.8  3.5 - 5.1 mEq/L    Chloride 100  96 - 112 mEq/L    CO2 26  19 - 32 mEq/L    Glucose, Bld 117 (*) 70 - 99 mg/dL    BUN 3 (*) 6 - 23 mg/dL    Creatinine, Ser 5.28  0.50 - 1.10 mg/dL    Calcium 8.4  8.4 - 41.3 mg/dL    Total Protein 6.4  6.0 - 8.3 g/dL    Albumin 2.5 (*) 3.5 - 5.2 g/dL    AST 90 (*) 0 - 37 U/L    ALT 39 (*) 0 - 35 U/L    Alkaline Phosphatase 156 (*) 39 - 117 U/L    Total Bilirubin 3.2 (*) 0.3 - 1.2 mg/dL    GFR calc non Af Amer >90  >90 mL/min    GFR calc Af Amer >90  >90 mL/min   CBC WITH DIFFERENTIAL     Status: Abnormal   Collection Time   07/17/12  5:40 AM      Component Value Range Comment   WBC 6.7  4.0 - 10.5 K/uL    RBC 2.51 (*) 3.87 - 5.11 MIL/uL    Hemoglobin 8.1 (*) 12.0 - 15.0 g/dL    HCT 24.4 (*) 01.0 - 46.0 %    MCV 90.8  78.0 - 100.0 fL    MCH 32.3  26.0 - 34.0 pg    MCHC 35.5  30.0 - 36.0 g/dL    RDW 27.2 (*) 53.6 - 15.5 %    Platelets 199  150 - 400 K/uL REPEATED TO VERIFY   Neutrophils Relative 40 (*) 43 - 77 %    Lymphocytes Relative 36  12 - 46 %    Monocytes Relative 17 (*) 3 -  12 %    Eosinophils Relative 6 (*) 0 - 5 %    Basophils Relative 1  0 - 1 %    Neutro Abs 2.7  1.7 - 7.7 K/uL    Lymphs Abs 2.4  0.7 - 4.0 K/uL    Monocytes Absolute 1.1 (*) 0.1 - 1.0 K/uL    Eosinophils  Absolute 0.4  0.0 - 0.7 K/uL    Basophils Absolute 0.1  0.0 - 0.1 K/uL    RBC Morphology POLYCHROMASIA PRESENT      Smear Review LARGE PLATELETS PRESENT     COMPREHENSIVE METABOLIC PANEL     Status: Abnormal   Collection Time   07/18/12  5:50 AM      Component Value Range Comment   Sodium 134 (*) 135 - 145 mEq/L    Potassium 3.8  3.5 - 5.1 mEq/L    Chloride 102  96 - 112 mEq/L    CO2 25  19 - 32 mEq/L    Glucose, Bld 109 (*) 70 - 99 mg/dL    BUN 4 (*) 6 - 23 mg/dL    Creatinine, Ser 7.82  0.50 - 1.10 mg/dL    Calcium 8.2 (*) 8.4 - 10.5 mg/dL    Total Protein 6.4  6.0 - 8.3 g/dL    Albumin 2.5 (*) 3.5 - 5.2 g/dL    AST 83 (*) 0 - 37 U/L    ALT 36 (*) 0 - 35 U/L    Alkaline Phosphatase 148 (*) 39 - 117 U/L    Total Bilirubin 3.5 (*) 0.3 - 1.2 mg/dL    GFR calc non Af Amer >90  >90 mL/min    GFR calc Af Amer >90  >90 mL/min   CBC WITH DIFFERENTIAL     Status: Abnormal   Collection Time   07/18/12  5:50 AM      Component Value Range Comment   WBC 6.7  4.0 - 10.5 K/uL    RBC 2.79 (*) 3.87 - 5.11 MIL/uL    Hemoglobin 9.1 (*) 12.0 - 15.0 g/dL    HCT 95.6 (*) 21.3 - 46.0 %    MCV 90.7  78.0 - 100.0 fL    MCH 32.6  26.0 - 34.0 pg    MCHC 36.0  30.0 - 36.0 g/dL    RDW 08.6 (*) 57.8 - 15.5 %    Platelets 191  150 - 400 K/uL    Neutrophils Relative 42 (*) 43 - 77 %    Lymphocytes Relative 34  12 - 46 %    Monocytes Relative 17 (*) 3 - 12 %    Eosinophils Relative 6 (*) 0 - 5 %    Basophils Relative 1  0 - 1 %    Neutro Abs 2.8  1.7 - 7.7 K/uL    Lymphs Abs 2.3  0.7 - 4.0 K/uL    Monocytes Absolute 1.1 (*) 0.1 - 1.0 K/uL    Eosinophils Absolute 0.4  0.0 - 0.7 K/uL    Basophils Absolute 0.1  0.0 - 0.1 K/uL    RBC Morphology POLYCHROMASIA PRESENT       Studies/Results: No results found.  Patient Active Problem List  Diagnosis  . Sickle cell crisis  . Sickle cell disease  . Elevated LFTs  . Sickle cell anemia  . Fever  . Leukocytosis  . HTN (hypertension)  . Hypokalemia    . Hypoalbuminemia  . Bronchitis  . Oral candidiasis  . Reactive depression (situational)  Impression:  Sickle cell crisis persisting. Active hemolysis continues. Hemoglobin SS disease. Patient with relatively high percentage of hemoglobin S on her last hemoglobin electrophoresis. She will benefit from further exchange transfusions. Limb pain secondary to relapsing crisis.   Plan: Exchange transfuse two units of prbc. Followup CBC, and CMET. Consider discharge tomorrow if signs of active hemolysis is trending downward. Patient to manage her pain thereafter as an outpatient.   Dawn [name], Dawn [name] 07/18/2012 5:18 PM

## 2012-07-19 LAB — COMPREHENSIVE METABOLIC PANEL
AST: 78 U/L — ABNORMAL HIGH (ref 0–37)
Albumin: 2.6 g/dL — ABNORMAL LOW (ref 3.5–5.2)
Calcium: 8.5 mg/dL (ref 8.4–10.5)
Chloride: 100 mEq/L (ref 96–112)
Creatinine, Ser: 0.68 mg/dL (ref 0.50–1.10)

## 2012-07-19 LAB — CBC WITH DIFFERENTIAL/PLATELET
Basophils Relative: 1 % (ref 0–1)
Eosinophils Relative: 5 % (ref 0–5)
HCT: 24.7 % — ABNORMAL LOW (ref 36.0–46.0)
Hemoglobin: 8.6 g/dL — ABNORMAL LOW (ref 12.0–15.0)
Lymphs Abs: 2.5 10*3/uL (ref 0.7–4.0)
MCH: 31.9 pg (ref 26.0–34.0)
MCV: 91.5 fL (ref 78.0–100.0)
Monocytes Absolute: 1.3 10*3/uL — ABNORMAL HIGH (ref 0.1–1.0)
RBC: 2.7 MIL/uL — ABNORMAL LOW (ref 3.87–5.11)

## 2012-07-19 NOTE — Progress Notes (Signed)
Subjective:  No new complaint. Awaiting exchange transfusion. T max 99.5 F  Objective:  Vital Signs in the last 24 hours: Temp:  [98.6 F (37 C)-99.5 F (37.5 C)] 99.2 F (37.3 C) (10/26 0920) Pulse Rate:  [101-114] 105  (10/26 0920) Cardiac Rhythm:  [-]  Resp:  [18-20] 18  (10/26 0920) BP: (101-123)/(62-82) 101/62 mmHg (10/26 0920) SpO2:  [94 %-98 %] 94 % (10/26 0920)  Physical Exam: BP Readings from Last 1 Encounters:  07/19/12 101/62    Wt Readings from Last 1 Encounters:  07/18/12 73.528 kg (162 lb 1.6 oz)    Weight change:   HEENT: [place]/AT, Eyes-Brown, PERL, EOMI, Conjunctiva-Pale, Sclera-icteric Neck: No JVD, No bruit, Trachea midline. Lungs:  Clear, Bilateral. Cardiac:  Regular rhythm, normal S1 and S2, no S3.  Abdomen:  Soft, non-tender. Extremities:  No edema present. No cyanosis. No clubbing. CNS: AxOx3, Cranial nerves grossly intact, moves all 4 extremities. Right handed. Skin: Warm and dry.   Intake/Output from previous day: 10/25 0701 - 10/26 0700 In: 1220 [P.O.:1180; I.V.:40] Out: -     Lab Results: BMET    Component Value Date/Time   NA 134* 07/19/2012 0606   K 3.7 07/19/2012 0606   CL 100 07/19/2012 0606   CO2 28 07/19/2012 0606   GLUCOSE 106* 07/19/2012 0606   BUN 4* 07/19/2012 0606   CREATININE 0.68 07/19/2012 0606   CALCIUM 8.5 07/19/2012 0606   GFRNONAA >90 07/19/2012 0606   GFRAA >90 07/19/2012 0606   CBC    Component Value Date/Time   WBC 7.1 07/19/2012 0606   RBC 2.70* 07/19/2012 0606   HGB 8.6* 07/19/2012 0606   HCT 24.7* 07/19/2012 0606   PLT 223 07/19/2012 0606   MCV 91.5 07/19/2012 0606   MCH 31.9 07/19/2012 0606   MCHC 34.8 07/19/2012 0606   RDW 22.4* 07/19/2012 0606   LYMPHSABS 2.5 07/19/2012 0606   MONOABS 1.3* 07/19/2012 0606   EOSABS 0.4 07/19/2012 0606   BASOSABS 0.1 07/19/2012 0606   CARDIAC ENZYMES Lab Results  Component Value Date   CKTOTAL 21 08/20/2010   CKMB 0.4 08/20/2010   TROPONINI <0.30 09/24/2011     Assessment/Plan:  Patient Active Hospital Problem List: Sickle cell crisis with anemia Leukocytosis-resolved Hypertension Bronchitis Depression  Continue medical treatment.    LOS: 14 days    Orpah Younis  MD  07/19/2012, 2:51 PM

## 2012-07-20 LAB — CBC WITH DIFFERENTIAL/PLATELET
Eosinophils Relative: 5 % (ref 0–5)
HCT: 26.1 % — ABNORMAL LOW (ref 36.0–46.0)
Lymphs Abs: 2.6 10*3/uL (ref 0.7–4.0)
MCH: 31.8 pg (ref 26.0–34.0)
MCV: 92.2 fL (ref 78.0–100.0)
Monocytes Absolute: 1.3 10*3/uL — ABNORMAL HIGH (ref 0.1–1.0)
Neutro Abs: 2.6 10*3/uL (ref 1.7–7.7)
Platelets: 260 10*3/uL (ref 150–400)
RBC: 2.83 MIL/uL — ABNORMAL LOW (ref 3.87–5.11)
RDW: 22.4 % — ABNORMAL HIGH (ref 11.5–15.5)

## 2012-07-20 LAB — COMPREHENSIVE METABOLIC PANEL
AST: 79 U/L — ABNORMAL HIGH (ref 0–37)
Albumin: 2.7 g/dL — ABNORMAL LOW (ref 3.5–5.2)
Alkaline Phosphatase: 152 U/L — ABNORMAL HIGH (ref 39–117)
Chloride: 101 mEq/L (ref 96–112)
Potassium: 3.8 mEq/L (ref 3.5–5.1)
Sodium: 135 mEq/L (ref 135–145)
Total Bilirubin: 3.7 mg/dL — ABNORMAL HIGH (ref 0.3–1.2)

## 2012-07-20 LAB — PREPARE RBC (CROSSMATCH)

## 2012-07-20 NOTE — Progress Notes (Signed)
Blood exchange completed. 300 ml removed per order. VSS. No ADN.

## 2012-07-20 NOTE — Progress Notes (Signed)
Notified physician on call to clarify transfusion order. Patient to receive only 1 exchange transfusion today. Will continue to monitor throughout shift.

## 2012-07-20 NOTE — Progress Notes (Signed)
Subjective:  Feeling better. Mildly elevated bilirubin. T max 99.2 F  Objective:  Vital Signs in the last 24 hours: Temp:  [98.2 F (36.8 C)-99.2 F (37.3 C)] 98.9 F (37.2 C) (10/27 0930) Pulse Rate:  [101-111] 110  (10/27 0555) Cardiac Rhythm:  [-]  Resp:  [15-18] 15  (10/27 0555) BP: (104-125)/(46-65) 118/59 mmHg (10/27 0555) SpO2:  [94 %-98 %] 94 % (10/27 0555)  Physical Exam: BP Readings from Last 1 Encounters:  07/20/12 118/59    Wt Readings from Last 1 Encounters:  07/18/12 73.528 kg (162 lb 1.6 oz)    Weight change:   HEENT: [place]/AT, Eyes-Brown, PERL, EOMI, Conjunctiva-Pale pink, Sclera-icteric Neck: No JVD, No bruit, Trachea midline. Lungs:  Clear, Bilateral. Cardiac:  Regular rhythm, normal S1 and S2, no S3.  Abdomen:  Soft, non-tender. Extremities:  No edema present. No cyanosis. No clubbing. CNS: AxOx3, Cranial nerves grossly intact, moves all 4 extremities. Right handed. Skin: Warm and dry.   Intake/Output from previous day: 10/26 0701 - 10/27 0700 In: 2938.3 [P.O.:2000; I.V.:938.3] Out: -     Lab Results: BMET    Component Value Date/Time   NA 135 07/20/2012 0440   K 3.8 07/20/2012 0440   CL 101 07/20/2012 0440   CO2 25 07/20/2012 0440   GLUCOSE 94 07/20/2012 0440   BUN 6 07/20/2012 0440   CREATININE 0.58 07/20/2012 0440   CALCIUM 8.7 07/20/2012 0440   GFRNONAA >90 07/20/2012 0440   GFRAA >90 07/20/2012 0440   CBC    Component Value Date/Time   WBC 6.9 07/20/2012 0440   RBC 2.83* 07/20/2012 0440   HGB 9.0* 07/20/2012 0440   HCT 26.1* 07/20/2012 0440   PLT 260 07/20/2012 0440   MCV 92.2 07/20/2012 0440   MCH 31.8 07/20/2012 0440   MCHC 34.5 07/20/2012 0440   RDW 22.4* 07/20/2012 0440   LYMPHSABS 2.6 07/20/2012 0440   MONOABS 1.3* 07/20/2012 0440   EOSABS 0.3 07/20/2012 0440   BASOSABS 0.1 07/20/2012 0440   CARDIAC ENZYMES Lab Results  Component Value Date   CKTOTAL 21 08/20/2010   CKMB 0.4 08/20/2010   TROPONINI <0.30 09/24/2011     Assessment/Plan:  Patient Active Hospital Problem List:  Sickle cell crisis with anemia  Leukocytosis-resolved  Hypertension  Bronchitis  Depression  Exchange transfusion today.   LOS: 15 days    Orpah Faux  MD  07/20/2012, 11:36 AM

## 2012-07-21 LAB — CBC WITH DIFFERENTIAL/PLATELET
Basophils Relative: 1 % (ref 0–1)
Eosinophils Relative: 4 % (ref 0–5)
Hemoglobin: 9 g/dL — ABNORMAL LOW (ref 12.0–15.0)
Lymphocytes Relative: 47 % — ABNORMAL HIGH (ref 12–46)
MCH: 32 pg (ref 26.0–34.0)
Monocytes Relative: 19 % — ABNORMAL HIGH (ref 3–12)
Neutrophils Relative %: 29 % — ABNORMAL LOW (ref 43–77)
Platelets: 259 10*3/uL (ref 150–400)
RBC: 2.81 MIL/uL — ABNORMAL LOW (ref 3.87–5.11)
WBC: 5.9 10*3/uL (ref 4.0–10.5)

## 2012-07-21 LAB — COMPREHENSIVE METABOLIC PANEL
AST: 80 U/L — ABNORMAL HIGH (ref 0–37)
BUN: 5 mg/dL — ABNORMAL LOW (ref 6–23)
CO2: 26 mEq/L (ref 19–32)
Calcium: 8.4 mg/dL (ref 8.4–10.5)
Creatinine, Ser: 0.58 mg/dL (ref 0.50–1.10)
GFR calc Af Amer: >90 mL/min (ref >90)
GFR calc non Af Amer: >90 mL/min (ref >90)

## 2012-07-21 MED ORDER — POTASSIUM CHLORIDE CRYS ER 20 MEQ PO TBCR: 40.0000 meq | EXTENDED_RELEASE_TABLET | Freq: Every day | ORAL | Status: DC

## 2012-07-21 MED ORDER — OXYCODONE HCL 10 MG PO TABS: 10.0000 mg | ORAL_TABLET | ORAL | Status: DC | PRN

## 2012-07-21 MED ORDER — VITAMIN D (ERGOCALCIFEROL) 1.25 MG (50000 UNIT) PO CAPS: 50000.0000 [IU] | ORAL_CAPSULE | ORAL | Status: DC

## 2012-07-21 MED ORDER — OXYCODONE HCL ER 10 MG PO T12A: 10.0000 mg | EXTENDED_RELEASE_TABLET | Freq: Two times a day (BID) | ORAL | Status: DC

## 2012-07-21 MED ORDER — DEFERASIROX 500 MG PO TBSO: 500.0000 mg | ORAL_TABLET | Freq: Every day | ORAL | Status: DC

## 2012-07-21 MED ORDER — CETIRIZINE HCL 10 MG PO TABS: 10.0000 mg | ORAL_TABLET | Freq: Every day | ORAL | Status: DC | PRN

## 2012-07-21 NOTE — Progress Notes (Signed)
Pt given d/c instructions, prescriptions, when to call MD--verbalized understanding. Belongings packed. NP to give exjade cup. D/C home with family. Pain controlled. IV team to take out PICC

## 2012-07-21 NOTE — Discharge Summary (Signed)
[facility] Discharge Summary   Patient ID: Dawn [name] MRN: [id number] DOB/AGE: 09-24-1981 31 y.o.  Admit date: 07/05/2012 Discharge date: 07/21/2012  Primary Care Physician:  Willey Blade, MD  Admission Diagnoses:  Sickle Cell Crisis  Discharge Diagnoses:   Sickle Cell Crisis, resolving Anemia of SCD, stable Acute on chronic pain, improved Secondary Hemachromatosis Elevated LFT'[name], resolving Leukocytosis/fever, resolving  Mild thrombocytopenia, resolved Abdominal pain, resolved Malnutrition with chronic low albumin Edema, peripheral,resolving Vitamin D Deficiency Constipation, resolved Proteinuria, chronic Allergic Rhinitis  Discharge Medications:    Medication List     As of 07/21/2012 10:30 AM    STOP taking these medications         cholecalciferol 1000 UNITS tablet   Commonly known as: VITAMIN D      oxyCODONE 10 MG 12 hr tablet   Commonly known as: OXYCONTIN      oxycodone 5 MG capsule   Commonly known as: OXY-IR   Replaced by: Oxycodone HCl 10 MG Tabs      TAKE these medications         cetirizine 10 MG tablet   Commonly known as: ZYRTEC   Take 10 mg by mouth daily as needed. For allergies      deferasirox 500 MG disintegrating tablet   Commonly known as: EXJADE   Take 1 tablet (500 mg total) by mouth daily before breakfast.      folic acid 1 MG tablet   Commonly known as: FOLVITE   Take 1 mg by mouth daily.      hydroxyurea 500 MG capsule   Commonly known as: HYDREA   Take 500-1,000 mg by mouth as directed. Take 500mg  (1 tablet) ON ODD DAYS OF THE MONTH AND 1000 MG (2) TABLETS ON THE EVEN DAYS.      OxyCODONE 10 mg Tb12   Commonly known as: OXYCONTIN   Take 1 tablet (10 mg total) by mouth every 12 (twelve) hours.      Oxycodone HCl 10 MG Tabs   Take 1 tablet (10 mg total) by mouth every 4 (four) hours as needed.      potassium chloride SA 20 MEQ tablet   Commonly known as: K-DUR,KLOR-CON   Take 2 tablets (40 mEq total) by  mouth daily.      Vitamin D (Ergocalciferol) 50000 UNITS Caps   Commonly known as: DRISDOL   Take 1 capsule (50,000 Units total) by mouth every 7 (seven) days.         Consults:  None  Significant Diagnostic Studies:  Dg Chest 2 View  07/06/2012  *RADIOLOGY REPORT*  Clinical Data: Fever, cough and sickle cell crisis  CHEST - 2 VIEW  Comparison: 09/24/2011  Findings: The heart, mediastinum and hila are normal.  The lungs are clear.  No pleural effusion or pneumothorax.  The bony thorax is intact.  IMPRESSION: No active disease of the chest.   Original Report Authenticated By: Domenic Moras, M.D.    Dg Abd 1 View  07/07/2012  *RADIOLOGY REPORT*  Clinical Data: Abdominal pain.  ABDOMEN - 1 VIEW  Comparison: Lumbar radiographs dated 08/10/2010  Findings:  The gallbladder has been removed.  IUD in place.  Bowel gas pattern is normal.  No visible free air or free fluid or worrisome abdominal calcifications.  No osseous abnormality.  Minimal atelectasis at the lung bases posteriorly.  IMPRESSION: Benign-appearing abdomen.   Original Report Authenticated By: Gwynn Burly, M.D.      [facility] Course:  For complete details please refer to admission H and P, but in brief, [name] is a 31 y.o. [name] with history of SS genotype Sickle Cell Disease who initially presented to the [facility] with complaints of pain consistent with her typical crisis and following 23 hour observation and uncontrolled pain admitted inpatient for further evaluation and treatment. Sickle cell crisis was identified with an elevated Retic 36.6% and T. Bili of 5.9 on admission. T. Bili trended up to 7.5 and required exchange transfusions throughout this hospitalization. She was treated with gentle IV hydration, IV pain meds for breakthrough pain, antiemetic, anti-pruretic, and a bowel management regimen. Lovenox was given for DVT prophylaxis. A pepeat Hgb electrophoresis on 07/07/12 (prior  to recent exchanges) was noted Hgb A 9.4, Hgb F 9.3, and Hgb [name] 78.0% and she remained on Hydrea and Folic acid daily. She was noted to have a T. Bili of 3.4 the morning of discharge and evidence ongoing active hemolysis, but with her pain significantly improved, she felt that she could manage her pain at home while her crisis continued to resolve. Anemia was followed closely during this admission. She had a Hgb was 6.5 on admission and due to the need for PICC line placement and antibodies in her blood, she underwent appropriate transfusions when warranted to keep her Hgb > 7.0 and she was discharged with a Hgb 9.0. Acute on chronic pain was difficult to control initially, however, following scheduled pain meds x 24 hours, exchange transfusion as needed, the use of Dilaudid IV prn severe breakthrough pain, maintenance on home pain medications including long-acting Oxycontin BID, and short-acting Oxy IR 10mg  every 4hr prn, her pain gradually improved and she felt she was able to manage her pain at home prior to discharge. She was placed on Protonix po for GI prophylaxis and NSAID'[name] were avoided as much as possible due to her history of chronic proteinuria and renal functioning. Secondary Hemachromatosis was further evaluated by obtaining a repeat Ferritin level of 1612 on 07/09/12. Of note, she had a elevated Ferritin of 1266 on 03/24/12 but due to a faint rash during the administration of IV Desferal, it was felt to be a questionable drug reaction and Desferal was identified in her medical records as an allergy. Due to the potential complications of iron over load, the risk vs. benefit of this medication was readdressed with the patient and she was pre-medicated with IV Benadryl and tolerated IV Desferal qhs throughout this admission. She will be discharged on po Exjade and a new Rx (#30, 3R) as well as a special mixing cup was given. Elevated LFT'[name]/Mild thrombocytopenia, abdominal pain were noted and monitored  closely. ALT as high as 53 and AST as high as 101 with ALK Phos as high as 174 and Plt as low as 153 were felt secondary to sickle cell crisis. A Fractionated Bili (Direct 1.8, indirect 3.5 and Total 5.3) was obtained and with a previously negative KUB, benign abdomen on exam without organomegaly appreciated, labs at the time of discharge revealed an ALT of 34, Plt Count of 259 indicating resolution. Leukocytosis/Fever was identified on admission and noted throughout this admission. WBC on adm of 12.5 with  T-Max 102.7 was further evaluated with negative BLD Cx, urine Cx, and CXR. A KUB revealed atelectasis and the use of Incentive spirometry encouraged. A questionable UTI was noted with urinalysis and she was empirically treated with Cipro 500 mg po BID x 3 days and a  urine cx was unrevealing. She was afebrile with a temp of 98.9 and her WBC trended down and was noted to be 5.9 at the time of discharge. Malnutrition with chronic low albumin was noted and followed during this admission. She was seen and evaluated by nutritional services. She was determined to have a BMI of 26.93 and overweight without dietary recommendations. Of note, this was felt multifactorial secondary to acute crisis, abnormal LFT'[name], and due to chronic proteinuria as noted below. She was placed on a high protein diet while inpatient (80-90 gm) and was noted to have an albumin of 2.5 at the time of discharge. This can be followed up in the outpatient setting. Edema, peripheral was initially noted in her Bilat LE but worse on the right. This was further evaluated with a duplex scan negative for RLE DVT. 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  The edema was resolving at the time of discharge and can be further evaluated in the outpatient setting. Vitamin D Deficiency was further evaluated  noting her daily use of OTC vitamin D. A 25-OH level was obtained and with a level of 14 this adm, she was treated with Ergocalciferol 50,000 IU weekly and will continue this for a total of 12 weeks. Rx given (#11, 0R). She will need a repeat 25-OH vitamin D level in 3 months in the outpatient setting. Constipation was problematic briefly and felt due to the use of narcotics for pain management. This quickly resolved and was no longer problematic with the addition of colace BID, Lactulose BID x 3 days then prn to her medications regimen. Since this was not previously problematic at home, she did not feel that she needed any medications and no Rx were given. Proteinuria was identified with her admission urinalysis but with further evaluation of chronic low albumin levels noted to have been chronic since 2008 (per patient). She reported having an intolerance to ACEI (cough) in the past and was not on any medications for treatment. She did, however, have normal kidney function (creatinine level 0.61) and an elevated spot protein/creatinine ratio of 1.8 was obtained on 07/09/12. Due to her soft BP'[name], felt secondary to pain meds, we were unable to start her on an ARB for cardiorenal protection prior to discharge. It is recommended that this be considered with her follow up appointment in the outpatient setting. Allergic Rhinitis was chronic, seasonal and addressed prior to discharge. She has taken Zyrtec in the past and requested a Rx refill. Rx given (#30,2R). She had no new complaints, accompanied by her mother and in no acute distress at the time of discharge.  Physical Exam at Discharge:  BP 106/53  Pulse 104  Temp 98.2 F (36.8 C) (Oral)  Resp 16  Ht 5\' 3"  (1.6 m)  Wt 73.528 kg (162 lb 1.6 oz)  BMI 28.71 kg/m2  SpO2 95%  General appearance: Alert, cooperative, WN, WD; mild distress  HEENT: Normocephalic, atraumatic, PERRL, mild scleral icterus, no sinus tenderness, oral pharynx moist, pink, patent  without exudate, neck supple, trachea midline, no cervical lymphadenopathy, or thyroimegaly.  Back: Symmetric, mild mid/low diffuse lumbarsacral tenderness  Cardio: RRR, S1, S2 normal, no murmur/click/rub/gallop  GI: Soft, non-tender, distended, hypoactive bowel sounds, no organomegaly  Extremities: no cyanosis or edema, tender bilateral upper and lower extremities; left PICC  Pulses: 2+ and symmetric  Skin: Skin  color, texture, turgor normal, no rashes or lesions, tattoos on her upper back  Neurologic: A, O x 3, grossly intact, no focal deficits  Psych: Appropriate affect   Disposition at Discharge: 01-Home or Self Care  Discharge Orders:  RTC in 1 week for labs; call clinic to schedule 2 week hospital follow up with Dr. August Saucer    Condition at Discharge:   Stable  Time spent on Discharge:  Greater than 30 minutes.  SignedLyda Perone, Dawn [name] 07/21/2012, 10:30 AM

## 2012-07-22 LAB — TYPE AND SCREEN
ABO/RH(D): A POS
Unit division: 0

## 2012-09-20 ENCOUNTER — Telehealth (HOSPITAL_COMMUNITY): Payer: Self-pay

## 2012-09-30 ENCOUNTER — Non-Acute Institutional Stay (HOSPITAL_COMMUNITY)
Admission: AD | Admit: 2012-09-30 | Discharge: 2012-10-01 | Disposition: A | Discharge status: Home / Self Care | Payer: Medicare Other | Attending: Internal Medicine | Admitting: Internal Medicine

## 2012-09-30 DIAGNOSIS — Z79899 Other long term (current) drug therapy: Secondary | ICD-10-CM | POA: Insufficient documentation

## 2012-09-30 DIAGNOSIS — G8929 Other chronic pain: Secondary | ICD-10-CM | POA: Insufficient documentation

## 2012-09-30 DIAGNOSIS — F411 Generalized anxiety disorder: Secondary | ICD-10-CM | POA: Diagnosis not present

## 2012-09-30 DIAGNOSIS — R52 Pain, unspecified: Secondary | ICD-10-CM | POA: Diagnosis not present

## 2012-09-30 DIAGNOSIS — D5700 Hb-SS disease with crisis, unspecified: Secondary | ICD-10-CM | POA: Insufficient documentation

## 2012-09-30 DIAGNOSIS — M79609 Pain in unspecified limb: Secondary | ICD-10-CM | POA: Diagnosis not present

## 2012-09-30 LAB — CBC WITH DIFFERENTIAL/PLATELET
Eosinophils Relative: 0 % (ref 0–5)
Lymphocytes Relative: 31 % (ref 12–46)
Monocytes Absolute: 0.8 10*3/uL (ref 0.1–1.0)
Monocytes Relative: 10 % (ref 3–12)
Neutrophils Relative %: 58 % (ref 43–77)
Platelets: 333 10*3/uL (ref 150–400)
RBC: 2.81 MIL/uL — ABNORMAL LOW (ref 3.87–5.11)
WBC: 7.5 10*3/uL (ref 4.0–10.5)

## 2012-09-30 LAB — COMPREHENSIVE METABOLIC PANEL
ALT: 36 U/L — ABNORMAL HIGH (ref 0–35)
AST: 82 U/L — ABNORMAL HIGH (ref 0–37)
Albumin: 3.8 g/dL (ref 3.5–5.2)
CO2: 24 mEq/L (ref 19–32)
Chloride: 102 mEq/L (ref 96–112)
GFR calc non Af Amer: >90 mL/min (ref >90)
Potassium: 3.9 mEq/L (ref 3.5–5.1)
Sodium: 137 mEq/L (ref 135–145)
Total Bilirubin: 6.8 mg/dL — ABNORMAL HIGH (ref 0.3–1.2)

## 2012-09-30 LAB — RETICULOCYTES: RBC.: 2.81 MIL/uL — ABNORMAL LOW (ref 3.87–5.11)

## 2012-09-30 MED ORDER — PROMETHAZINE HCL 25 MG/ML IJ SOLN
12.5000 mg | Freq: Four times a day (QID) | INTRAMUSCULAR | Status: DC | PRN
  Administered 2012-09-30: 25 mg via INTRAVENOUS
  Administered 2012-10-01: 12.5 mg via INTRAVENOUS
  Filled 2012-09-30 (×2): qty 1

## 2012-09-30 MED ORDER — VITAMIN D (ERGOCALCIFEROL) 1.25 MG (50000 UNIT) PO CAPS: 50000.0000 [IU] | ORAL_CAPSULE | ORAL | Status: DC

## 2012-09-30 MED ORDER — DEXTROSE-NACL 5-0.45 % IV SOLN
INTRAVENOUS | Status: DC
  Administered 2012-09-30 – 2012-10-01 (×3): via INTRAVENOUS

## 2012-09-30 MED ORDER — LORATADINE 10 MG PO TABS
10.0000 mg | ORAL_TABLET | Freq: Every day | ORAL | Status: DC
  Filled 2012-09-30 (×2): qty 1

## 2012-09-30 MED ORDER — DIPHENHYDRAMINE HCL 25 MG PO CAPS
25.0000 mg | ORAL_CAPSULE | ORAL | Status: DC | PRN
Start: 2012-09-30 — End: 2012-10-01

## 2012-09-30 MED ORDER — POTASSIUM CHLORIDE CRYS ER 20 MEQ PO TBCR
40.0000 meq | EXTENDED_RELEASE_TABLET | Freq: Every day | ORAL | Status: DC
  Administered 2012-10-01: 40 meq via ORAL
  Filled 2012-09-30: qty 2

## 2012-09-30 MED ORDER — ONDANSETRON HCL 4 MG/2ML IJ SOLN
4.0000 mg | INTRAMUSCULAR | Status: DC | PRN
  Administered 2012-09-30 (×2): 4 mg via INTRAVENOUS
  Filled 2012-09-30 (×2): qty 2

## 2012-09-30 MED ORDER — DIPHENHYDRAMINE HCL 50 MG/ML IJ SOLN
12.5000 mg | INTRAMUSCULAR | Status: DC | PRN
  Administered 2012-10-01: 25 mg via INTRAVENOUS
  Filled 2012-09-30: qty 1

## 2012-09-30 MED ORDER — HYDROXYUREA 500 MG PO CAPS: 500.0000 mg | ORAL_CAPSULE | ORAL | Status: DC

## 2012-09-30 MED ORDER — FOLIC ACID 1 MG PO TABS
1.0000 mg | ORAL_TABLET | Freq: Every day | ORAL | Status: DC
  Administered 2012-10-01: 1 mg via ORAL
  Filled 2012-09-30: qty 1

## 2012-09-30 MED ORDER — HYDROXYUREA 500 MG PO CAPS
1000.0000 mg | ORAL_CAPSULE | ORAL | Status: DC
  Administered 2012-10-01: 1000 mg via ORAL
  Filled 2012-09-30: qty 2

## 2012-09-30 MED ORDER — OXYCODONE HCL ER 10 MG PO T12A
10.0000 mg | EXTENDED_RELEASE_TABLET | Freq: Two times a day (BID) | ORAL | Status: DC
  Administered 2012-09-30 – 2012-10-01 (×2): 10 mg via ORAL
  Filled 2012-09-30 (×2): qty 1

## 2012-09-30 MED ORDER — ALPRAZOLAM 0.25 MG PO TABS
0.2500 mg | ORAL_TABLET | Freq: Three times a day (TID) | ORAL | Status: DC | PRN
  Administered 2012-09-30: 0.25 mg via ORAL
  Filled 2012-09-30: qty 1

## 2012-09-30 MED ORDER — HYDROMORPHONE HCL PF 2 MG/ML IJ SOLN
2.0000 mg | INTRAMUSCULAR | Status: DC | PRN
  Administered 2012-09-30 (×2): 3 mg via INTRAVENOUS
  Administered 2012-09-30 – 2012-10-01 (×5): 4 mg via INTRAVENOUS
  Filled 2012-09-30 (×6): qty 2
  Filled 2012-09-30 (×2): qty 1

## 2012-09-30 MED ORDER — ONDANSETRON HCL 4 MG PO TABS: 4.0000 mg | ORAL_TABLET | ORAL | Status: DC | PRN

## 2012-09-30 NOTE — Progress Notes (Signed)
Patient A & O during shift, VSS, however, patient requiring 02@2l  for saturations of 88-89% on R/A.  95% on 2LNC.  Patient with 2 episodes of emesis, zofran given without improvement, order for phenergan given, no additional emesis.  IV to Right wrist infusing without difficulty.  No acute changes, RN will continue to monitor.

## 2012-09-30 NOTE — H&P (Signed)
[facility] History and Physical   Date: 09/30/2012  Patient name: Dawn [name] Medical record number: [id number] Date of birth: 1981/01/16 Age: 32 y.o. Gender: female PCP: August Saucer ERIC, MD  Attending physician: Gwenyth Bender, MD  Chief Complaint: bilateral pain in arms and legs   History of Present Illness: Ms Zettler is a 32 year old [name] with SS genotype sickle cell disease. PMH of  Hemochromatosis and depression (sitiuational) Recently having intermittent bouts with pain for the last 2-3 weeks rates pain as  6/10 base line 3/10 describes pain in shoulders and arms as  Sharpe aching pain. This was not relieved by home pain medication.  Called the clinic today for evaluation and treatment admitted for 23 hrs observation.   Meds: Prescriptions prior to admission  Medication Sig Dispense Refill  . folic acid (FOLVITE) 1 MG tablet Take 1 mg by mouth daily.        . hydroxyurea (HYDREA) 500 MG capsule Take 500-1,000 mg by mouth as directed. Take 500mg  (1 tablet) ON ODD DAYS OF THE MONTH AND 1000 MG (2) TABLETS ON THE EVEN DAYS.      . OxyCODONE (OXYCONTIN) 10 mg TB12 Take 1 tablet (10 mg total) by mouth every 12 (twelve) hours.  60 tablet  0  . Oxycodone HCl 10 MG TABS Take 1 tablet (10 mg total) by mouth every 4 (four) hours as needed.  45 tablet  0  . potassium chloride SA (K-DUR,KLOR-CON) 20 MEQ tablet Take 2 tablets (40 mEq total) by mouth daily.  60 tablet  0  . Vitamin D, Ergocalciferol, (DRISDOL) 50000 UNITS CAPS Take 1 capsule (50,000 Units total) by mouth every 7 (seven) days.  11 capsule  0  . cetirizine (ZYRTEC) 10 MG tablet Take 1 tablet (10 mg total) by mouth daily as needed. For allergies  30 tablet  2  . deferasirox (EXJADE) 500 MG disintegrating tablet Take 1 tablet (500 mg total) by mouth daily before breakfast.  30 tablet  3    Allergies: Desferal; Latex; Lisinopril; Tape; and Tylenol Past Medical History  Diagnosis Date  . Sickle cell disease     . Sickle cell disease, type S   . Blood transfusion   . 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 Cholecystectomy   .  left knee acl reconstruction   . Cesarean section     x 2  . Portacath placement    No family history on file. History   Social History  . Marital Status: Single    Spouse Name: N/A    Number of Children: N/A  . Years of Education: N/A   Occupational History  . Not on file.   Social History Main Topics  . Smoking status: Never Smoker   . Smokeless tobacco: Not on file  . Alcohol Use: No  . Drug Use: No  . Sexually Active: Not Currently    Birth Control/ Protection: IUD   Other Topics Concern  . Not on file   Social History Narrative  . No narrative on file    Review of Systems: 10 point review negative except noted in HPI   Physical Exam: Blood pressure 130/79, pulse 97, temperature 98.5 F (36.9 C), temperature source Oral, resp. rate 20, height 5\' 3"  (1.6 m), weight 68.04 kg (150 lb), SpO2 94.00%.  General appearance: Alert, cooperative, well nourished, well developed, no apparent distress  Head: Normocephalic, without obvious abnormality, atraumatic  Eyes: PERRLA, EOMI, mild scleral icterus  Nose: Nares, septum and mucosa are normal, no sinus tenderness  Throat: Lips, mucosa, and tongue are normal; teeth and gums are normal  Neck: No adenopathy, supple, symmetrical, trachea midline and thyroid not enlarged, symmetric, no tenderness/mass/nodules  Back: Symmetric, no curvature, no CVA tenderness, diffuse lower/middle tenderness  Cardiovascular: Regular rate and rhythm, S1, S2 normal, no murmur/click/rub/gallop  GI: Soft, non-tender, distended, hypoactive bowel sounds, no organomegaly  Extremities: Extremities normal, atraumatic, no cyanosis or edema, tender bilateral LEs  Pulses: 2+ and symmetric  Skin: Skin color, texture, turgor normal, no rashes or lesions, tattoos on her upper back   Neurologic: Grossly normal, AO x 3, no focal deficits, CN II - XII intact  Psych: Appropriate affect   Lab results: Pending   Imaging results:  No results found.   Assessment & Plan: Patient Active Hospital Problem List: Vaso occlusive crisis : labs pending  She will Receive pain/nausea/pruritis management. The patient is also receiving her home medications and IV hydration.  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

## 2012-09-30 NOTE — Progress Notes (Signed)
Patient A&O x3, patient remains on 02@2L , NAD, this RN will continue to monitor. No changes during this shift.

## 2012-10-01 ENCOUNTER — Telehealth (HOSPITAL_COMMUNITY): Payer: Self-pay | Admitting: Hematology

## 2012-10-01 DIAGNOSIS — M79609 Pain in unspecified limb: Secondary | ICD-10-CM | POA: Diagnosis not present

## 2012-10-01 DIAGNOSIS — D5700 Hb-SS disease with crisis, unspecified: Secondary | ICD-10-CM | POA: Diagnosis not present

## 2012-10-01 LAB — COMPREHENSIVE METABOLIC PANEL
AST: 73 U/L — ABNORMAL HIGH (ref 0–37)
Albumin: 3.2 g/dL — ABNORMAL LOW (ref 3.5–5.2)
Chloride: 100 mEq/L (ref 96–112)
Creatinine, Ser: 0.69 mg/dL (ref 0.50–1.10)
Total Bilirubin: 5.2 mg/dL — ABNORMAL HIGH (ref 0.3–1.2)

## 2012-10-01 LAB — CBC WITH DIFFERENTIAL/PLATELET
Basophils Relative: 1 % (ref 0–1)
Eosinophils Relative: 1 % (ref 0–5)
Hemoglobin: 8.4 g/dL — ABNORMAL LOW (ref 12.0–15.0)
Lymphs Abs: 4.1 10*3/uL — ABNORMAL HIGH (ref 0.7–4.0)
MCH: 33.5 pg (ref 26.0–34.0)
MCV: 92.4 fL (ref 78.0–100.0)
Monocytes Absolute: 0.9 10*3/uL (ref 0.1–1.0)
Neutro Abs: 4.2 10*3/uL (ref 1.7–7.7)
Platelets: 277 10*3/uL (ref 150–400)
RBC: 2.51 MIL/uL — ABNORMAL LOW (ref 3.87–5.11)

## 2012-10-01 MED ORDER — PROMETHAZINE HCL 12.5 MG PO TABS: 12.5000 mg | ORAL_TABLET | Freq: Four times a day (QID) | ORAL | Status: DC | PRN

## 2012-10-01 MED ORDER — OXYCODONE HCL 10 MG PO TABS
10.0000 mg | ORAL_TABLET | ORAL | Status: DC | PRN
Start: 2012-10-01 — End: 2012-12-11

## 2012-10-01 NOTE — Progress Notes (Signed)
Per Marcelino Duster, NP, patient will come back on Sunday 10-05-12 to be type & crossed, will return on Tuesday 10-07-2012 to have PICC line placed and exchange/transfusion.  Jola Babinski from IV notified.

## 2012-10-01 NOTE — Discharge Summary (Signed)
[facility] Discharge Summary   Patient ID: Dawn [name] MRN: [id number] DOB/AGE: [birth year] 32 y.o.  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 For allergies      deferasirox 500 MG disintegrating tablet   Commonly known as: EXJADE   Take 1 tablet (500 mg total) by mouth daily before breakfast.      folic acid 1 MG tablet   Commonly known as: FOLVITE   Take 1 mg by mouth daily.      hydroxyurea 500 MG capsule   Commonly known as: HYDREA   Take 500-1,000 mg by mouth as directed. Take 500mg  (1 tablet) ON ODD DAYS OF THE MONTH AND 1000 MG (2) TABLETS ON THE EVEN DAYS.      OxyCODONE 10 mg T12a   Commonly known as: OXYCONTIN   Take 1 tablet (10 mg total) by mouth every 12 (twelve) hours.      Oxycodone HCl 10 MG Tabs   Take 1 tablet (10 mg total) by mouth every 4 (four) hours as needed.      potassium chloride SA 20 MEQ tablet   Commonly known as: K-DUR,KLOR-CON   Take 2 tablets (40 mEq total) by mouth daily.      Vitamin D (Ergocalciferol) 50000 UNITS Caps   Commonly known as: DRISDOL   Take 1 capsule (50,000 Units total) by mouth every 7 (seven) days.         Consults:  None  Significant Diagnostic Studies:  No results found.   [facility] Course:  For complete details please refer to admission H and P, but in brief, Dawn [name] is a 32 year old [name] with SS genotype sickle cell disease. PMH of  Hemochromatosis and depression (sitiuational) Recently having intermittent bouts with pain for the last 2-3 weeks rates pain as 6/10 base line 3/10 describes pain in shoulders and arms as Sharpe aching pain. Vaso Occlusive crisis with active hemolysis on admission 6.8 and d/c 5.2 slight improvement plan is to be type and cross and exchange the first of the week. Treated with gentle hydration antiemetic for several episodes of nausea and vomiting and anti pruritus for inflammatory process. Continued folic acid and hydrrea  Anti anxiety prescribed for increase anxiousness in the clinic and hospital setting. Hemolytic anemia secondary to SCD future plans to transfuse and exchange . She feels her pain level is down to 3/10 and able manage her pain at home.   Physical Exam at Discharge:  BP 114/67  Pulse 87  Temp 98.1 F (36.7 C) (Oral)  Resp 18  Ht 5\' 3"  (1.6 m)  Wt 68.04 kg (150 lb)  BMI 26.57 kg/m2  SpO2 99%  General appearance: Alert, cooperative, well nourished, well developed, no apparent distress   Cardiovascular: Regular rate and rhythm, S1, S2 normal, no murmur/click/rub/gallop  Respiratory :  lungs CTA bilaterally diminished in bases no vocal fremitus  GI: Soft, non-tender, distended, hypoactive bowel sounds, no organomegaly  Extremities: Extremities normal, atraumatic, no cyanosis or edema, tender bilateral LEs  Pulses: 2+ and symmetric  Psych: Appropriate affect  Disposition at Discharge: Home or Self Care  Discharge Orders:   Condition at Discharge:   Stable  Time spent on Discharge:  Greater than 30 minutes.  Signed: EDWARDS, MICHELLE P 10/01/2012, 8:56 AM

## 2012-10-01 NOTE — Progress Notes (Signed)
Patient ID: Dawn [name], female   DOB: 21-Oct-1980, 32 y.o.   MRN: [id number] Discharge instructions given to patient, along with prescriptions for Oxycodone, and phenergan, belongings returned to patient, IV removed without difficulty, questions answered,  Patient is stable at discharge.

## 2012-10-06 ENCOUNTER — Non-Acute Institutional Stay (HOSPITAL_COMMUNITY)
Admission: AD | Admit: 2012-10-06 | Discharge: 2012-10-06 | Disposition: A | Discharge status: Home / Self Care | Payer: Medicare Other | Attending: Internal Medicine | Admitting: Internal Medicine

## 2012-10-06 DIAGNOSIS — D5700 Hb-SS disease with crisis, unspecified: Secondary | ICD-10-CM | POA: Diagnosis not present

## 2012-10-06 NOTE — Progress Notes (Signed)
Patient to [facility] for type & cross.  Sample collected without difficulty.  Patient advised to leave blood band in place.  Patient will return to [facility] 10-07-2012 to have PICC line placed at 10:00a.m., and then transfusion of 1 unit.

## 2012-10-07 ENCOUNTER — Non-Acute Institutional Stay (HOSPITAL_COMMUNITY): Payer: Medicare Other

## 2012-10-07 ENCOUNTER — Non-Acute Institutional Stay (HOSPITAL_COMMUNITY)
Admission: AD | Admit: 2012-10-07 | Discharge: 2012-10-07 | Disposition: A | Discharge status: Home / Self Care | Payer: Medicare Other | Source: Ambulatory Visit | Attending: Internal Medicine | Admitting: Internal Medicine

## 2012-10-07 DIAGNOSIS — D571 Sickle-cell disease without crisis: Secondary | ICD-10-CM | POA: Insufficient documentation

## 2012-10-07 DIAGNOSIS — Z452 Encounter for adjustment and management of vascular access device: Secondary | ICD-10-CM | POA: Diagnosis not present

## 2012-10-07 LAB — COMPREHENSIVE METABOLIC PANEL
ALT: 33 U/L (ref 0–35)
Alkaline Phosphatase: 156 U/L — ABNORMAL HIGH (ref 39–117)
BUN: 5 mg/dL — ABNORMAL LOW (ref 6–23)
Chloride: 104 mEq/L (ref 96–112)
GFR calc Af Amer: >90 mL/min (ref >90)
Glucose, Bld: 95 mg/dL (ref 70–99)
Potassium: 3.5 mEq/L (ref 3.5–5.1)
Total Bilirubin: 5 mg/dL — ABNORMAL HIGH (ref 0.3–1.2)

## 2012-10-07 LAB — CBC WITH DIFFERENTIAL/PLATELET
Basophils Absolute: 0.1 10*3/uL (ref 0.0–0.1)
Basophils Absolute: 0 10*3/uL (ref 0.0–0.1)
Basophils Relative: 0 % (ref 0–1)
Eosinophils Absolute: 0.1 10*3/uL (ref 0.0–0.7)
Eosinophils Absolute: 0.1 10*3/uL (ref 0.0–0.7)
Hemoglobin: 9.5 g/dL — ABNORMAL LOW (ref 12.0–15.0)
Lymphs Abs: 2.7 10*3/uL (ref 0.7–4.0)
MCH: 34.4 pg — ABNORMAL HIGH (ref 26.0–34.0)
MCHC: 36.4 g/dL — ABNORMAL HIGH (ref 30.0–36.0)
MCHC: 36.5 g/dL — ABNORMAL HIGH (ref 30.0–36.0)
MCV: 95.7 fL (ref 78.0–100.0)
Monocytes Relative: 12 % (ref 3–12)
Monocytes Relative: 12 % (ref 3–12)
Neutro Abs: 3.9 10*3/uL (ref 1.7–7.7)
Neutrophils Relative %: 45 % (ref 43–77)
Platelets: 279 10*3/uL (ref 150–400)
Platelets: 246 10*3/uL (ref 150–400)
RDW: 24.8 % — ABNORMAL HIGH (ref 11.5–15.5)
WBC: 7.3 10*3/uL (ref 4.0–10.5)

## 2012-10-07 MED ORDER — ACETAMINOPHEN 325 MG PO TABS
650.0000 mg | ORAL_TABLET | Freq: Once | ORAL | Status: AC
  Administered 2012-10-07: 650 mg via ORAL
  Filled 2012-10-07: qty 2

## 2012-10-07 MED ORDER — PROMETHAZINE HCL 25 MG PO TABS
25.0000 mg | ORAL_TABLET | Freq: Four times a day (QID) | ORAL | Status: DC | PRN
  Administered 2012-10-07: 25 mg via ORAL
  Filled 2012-10-07: qty 1

## 2012-10-07 MED ORDER — DIPHENHYDRAMINE HCL 25 MG PO CAPS
25.0000 mg | ORAL_CAPSULE | Freq: Once | ORAL | Status: AC
  Administered 2012-10-07: 25 mg via ORAL
  Filled 2012-10-07: qty 1

## 2012-10-07 MED ORDER — HYDROMORPHONE HCL 4 MG PO TABS
4.0000 mg | ORAL_TABLET | ORAL | Status: DC | PRN
  Administered 2012-10-07 (×2): 4 mg via ORAL
  Filled 2012-10-07 (×2): qty 1

## 2012-10-07 NOTE — Progress Notes (Signed)
[facility], PICC removed. CDI., vitals stable.,no complications

## 2012-10-07 NOTE — Progress Notes (Signed)
[facility] to receive blood transfusion only.Marland Kitchen PICC line placed, blood hung with complications, vitals stable and patient comfortable at this time.

## 2012-10-07 NOTE — Procedures (Signed)
Successful placement of right brachial approach 38 cm dual lumen PICC line with tip at the superior caval-atrial junction.  The PICC line is ready for immediate use.

## 2012-10-08 LAB — TYPE AND SCREEN
DAT, IgG: NEGATIVE
Unit division: 0

## 2012-10-13 NOTE — Discharge Summary (Cosign Needed)
Dawn [name] presented for PICC line placement and exchange transfusion . After completion IV per protocol  D/c PICC without incident. Patient tolerated exchange/transfusion well.  Discharge to home in stable condition    Gwinda Passe, NP-C

## 2012-10-13 NOTE — H&P (Cosign Needed)
Dawn [name]  Is a 32 year old [name] with SS genotype sickle cell disease. Initally seen in the [facility] last week a exchange/transfusion was needed at that time however due to antibody's on her blood she agreed to return for type/cross screen than return when blood was available. She presented today for a schedule  PICC line placement prior to her exchange/transfusion. IV explain risk v/s benefit.  Procedure without incident. She received 1 unit of PRBC and 200 cc removed. During the procedure she only discussed concerns. We did discuss a port a cath at a later time and scheduling exchanges and transfusions voiced frustration of being sick every 3-4 months.  Patient tolerated procedure well.    Gwinda Passe, NP-C

## 2012-11-04 NOTE — Telephone Encounter (Signed)
Close encounter 

## 2012-11-13 ENCOUNTER — Telehealth (HOSPITAL_COMMUNITY): Payer: Self-pay | Admitting: Hematology

## 2012-11-13 ENCOUNTER — Non-Acute Institutional Stay (HOSPITAL_COMMUNITY)
Admission: AD | Admit: 2012-11-13 | Discharge: 2012-11-14 | Disposition: A | Discharge status: Home / Self Care | Payer: Medicare Other | Attending: Internal Medicine | Admitting: Internal Medicine

## 2012-11-13 DIAGNOSIS — D571 Sickle-cell disease without crisis: Secondary | ICD-10-CM | POA: Insufficient documentation

## 2012-11-13 DIAGNOSIS — F329 Major depressive disorder, single episode, unspecified: Secondary | ICD-10-CM

## 2012-11-13 DIAGNOSIS — Z79899 Other long term (current) drug therapy: Secondary | ICD-10-CM | POA: Diagnosis not present

## 2012-11-13 DIAGNOSIS — F430 Acute stress reaction: Secondary | ICD-10-CM | POA: Insufficient documentation

## 2012-11-13 DIAGNOSIS — G8929 Other chronic pain: Secondary | ICD-10-CM | POA: Insufficient documentation

## 2012-11-13 DIAGNOSIS — R52 Pain, unspecified: Secondary | ICD-10-CM | POA: Insufficient documentation

## 2012-11-13 LAB — RETICULOCYTES: Retic Ct Pct: 32.6 % — ABNORMAL HIGH (ref 0.4–3.1)

## 2012-11-13 MED ORDER — HYDROMORPHONE HCL PF 2 MG/ML IJ SOLN
2.0000 mg | INTRAMUSCULAR | Status: DC | PRN
  Administered 2012-11-13 (×2): 4 mg via INTRAVENOUS
  Administered 2012-11-14 (×2): 2 mg via INTRAVENOUS
  Administered 2012-11-14: 4 mg via INTRAVENOUS
  Administered 2012-11-14: 3 mg via INTRAVENOUS
  Filled 2012-11-13: qty 1
  Filled 2012-11-13 (×3): qty 2
  Filled 2012-11-13 (×2): qty 1
  Filled 2012-11-13: qty 2

## 2012-11-13 MED ORDER — PROMETHAZINE HCL 25 MG/ML IJ SOLN
25.0000 mg | INTRAMUSCULAR | Status: DC | PRN
Start: 2012-11-13 — End: 2012-11-14
  Administered 2012-11-14: 25 mg via INTRAVENOUS
  Filled 2012-11-13 (×2): qty 1

## 2012-11-13 MED ORDER — ONDANSETRON HCL 4 MG/2ML IJ SOLN: 4.0000 mg | INTRAMUSCULAR | Status: DC | PRN

## 2012-11-13 MED ORDER — PROMETHAZINE HCL 25 MG/ML IJ SOLN
12.5000 mg | Freq: Once | INTRAMUSCULAR | Status: AC
  Administered 2012-11-13: 12.5 mg via INTRAMUSCULAR

## 2012-11-13 MED ORDER — DIPHENHYDRAMINE HCL 25 MG PO CAPS: 25.0000 mg | ORAL_CAPSULE | ORAL | Status: DC | PRN

## 2012-11-13 MED ORDER — FOLIC ACID 1 MG PO TABS
1.0000 mg | ORAL_TABLET | Freq: Every day | ORAL | Status: DC
  Administered 2012-11-14: 1 mg via ORAL
  Filled 2012-11-13: qty 1

## 2012-11-13 MED ORDER — FOLIC ACID 1 MG PO TABS: 1.0000 mg | ORAL_TABLET | Freq: Every day | ORAL | Status: DC

## 2012-11-13 MED ORDER — OXYCODONE HCL ER 10 MG PO T12A
10.0000 mg | EXTENDED_RELEASE_TABLET | Freq: Two times a day (BID) | ORAL | Status: DC
  Administered 2012-11-13 – 2012-11-14 (×2): 10 mg via ORAL
  Filled 2012-11-13 (×2): qty 1

## 2012-11-13 MED ORDER — OXYCODONE HCL 5 MG PO TABS: 10.0000 mg | ORAL_TABLET | ORAL | Status: DC | PRN

## 2012-11-13 MED ORDER — KETOROLAC TROMETHAMINE 30 MG/ML IJ SOLN
30.0000 mg | Freq: Four times a day (QID) | INTRAMUSCULAR | Status: AC
  Administered 2012-11-13 – 2012-11-14 (×4): 30 mg via INTRAVENOUS
  Filled 2012-11-13 (×4): qty 1

## 2012-11-13 MED ORDER — DEXTROSE-NACL 5-0.45 % IV SOLN
INTRAVENOUS | Status: DC
  Administered 2012-11-13 – 2012-11-14 (×3): via INTRAVENOUS

## 2012-11-13 MED ORDER — HYDROXYUREA 500 MG PO CAPS: 1000.0000 mg | ORAL_CAPSULE | ORAL | Status: DC

## 2012-11-13 MED ORDER — DIPHENHYDRAMINE HCL 50 MG/ML IJ SOLN: 12.5000 mg | INTRAMUSCULAR | Status: DC | PRN

## 2012-11-13 MED ORDER — HYDROMORPHONE HCL PF 2 MG/ML IJ SOLN
2.0000 mg | Freq: Once | INTRAMUSCULAR | Status: AC
  Administered 2012-11-13: 2 mg via INTRAMUSCULAR

## 2012-11-13 MED ORDER — HYDROXYUREA 500 MG PO CAPS: 500.0000 mg | ORAL_CAPSULE | ORAL | Status: DC

## 2012-11-13 MED ORDER — ONDANSETRON HCL 4 MG PO TABS
4.0000 mg | ORAL_TABLET | ORAL | Status: DC | PRN
  Administered 2012-11-14: 4 mg via ORAL
  Filled 2012-11-13: qty 1

## 2012-11-13 MED ORDER — HYDROXYUREA 500 MG PO CAPS
500.0000 mg | ORAL_CAPSULE | ORAL | Status: DC
  Administered 2012-11-14: 500 mg via ORAL
  Filled 2012-11-13: qty 1

## 2012-11-13 NOTE — Telephone Encounter (Signed)
Patient calling in C/O pain to arms & legs, since Monday.  She states she has taken her oxycontin and oxycodone without relief.  Advised I would notify the nurse practitioner and give her a call back.  Patient verbalizes understanding.

## 2012-11-13 NOTE — Telephone Encounter (Signed)
Called patient back and advised that I talked with Vilinda Blanks, the NP and it is ok for her to come to the [facility].  Patient verbalizes understanding.

## 2012-11-13 NOTE — H&P (Signed)
[facility] History and Physical   Date: 11/13/2012  Patient name: Dawn [name] Medical record number: [id number] Date of birth: 05-Jul-1981 Age: 32 y.o. Gender: female PCP: August Saucer ERIC, MD  Attending physician: Altha Harm, MD  Chief Complaint: constant dull pain to her arms, legs, and knees since Monday 11/10/12  History of Present Illness: Dawn [name] is a 32 yr old AA female with SS Sickle Cell Disease who contacted the [facility] with complaints abrupt onset of constant dull pain to her arms, legs, and knees since Monday 11/10/12. She also reports slight mid-sternal tenderness to palpation only and not associated with SOB. She rates this pain 7/10 with a normal baseline 0-1/10 at home. Her last crisis hospital admission was 10/12-28/13 and since then she has been admitted to the [facility] on 09/30/12 acute pain crisis with the need for an exchange transfusion but due to antibodies had to be readmitted for PICC and exchange/transfusion on 1/13-14/14. Since then she has been doing fine and attributes this acute painful episode to stressors at home from multiple unexpected deaths within her church family and community. She currently denies fever, chills, headache, dizziness, chest pain, SOB, palpitations, nausea, vomiting, diarrhea, abdominal pain, dysuria, hematuria, urinary urgency or frequency, and constipation with last BM reported this am.      Meds: Prescriptions prior to admission  Medication Sig Dispense Refill  . cetirizine (ZYRTEC) 10 MG tablet Take 1 tablet (10 mg total) by mouth daily as needed. For allergies  30 tablet  2  . deferasirox (EXJADE) 500 MG disintegrating tablet Take 1 tablet (500 mg total) by mouth daily before breakfast.  30 tablet  3  . folic acid (FOLVITE) 1 MG tablet Take 1 mg by mouth daily.        . hydroxyurea (HYDREA) 500 MG capsule Take 500-1,000 mg by mouth as directed. Take 500mg  (1 tablet) ON ODD DAYS OF THE MONTH AND 1000 MG (2) TABLETS ON THE EVEN  DAYS.      . OxyCODONE (OXYCONTIN) 10 mg TB12 Take 1 tablet (10 mg total) by mouth every 12 (twelve) hours.  60 tablet  0  . Oxycodone HCl 10 MG TABS Take 1 tablet (10 mg total) by mouth every 4 (four) hours as needed.  45 tablet  0  . potassium chloride SA (K-DUR,KLOR-CON) 20 MEQ tablet Take 2 tablets (40 mEq total) by mouth daily.  60 tablet  0  . promethazine (PHENERGAN) 12.5 MG tablet Take 1 tablet (12.5 mg total) by mouth every 6 (six) hours as needed for nausea. 1-2 prn for nausea begin with 1 if no relief may repeat x's 1  30 tablet  0  . Vitamin D, Ergocalciferol, (DRISDOL) 50000 UNITS CAPS Take 1 capsule (50,000 Units total) by mouth every 7 (seven) days.  11 capsule  0    Allergies: Desferal; Latex; Lisinopril; Tape; and Tylenol Past Medical History  Diagnosis Date  . Sickle cell disease   . Sickle cell disease, type S   . Blood transfusion   . 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 Cholecystectomy    .  left knee acl reconstruction    . Cesarean section      x 2  . Portacath placement     No family history on file. History   Social History  . Marital Status: Single    Spouse Name: N/A    Number of Children: N/A  . Years  of Education: N/A   Occupational History  . Not on file.   Social History Main Topics  . Smoking status: Never Smoker   . Smokeless tobacco: Not on file  . Alcohol Use: No  . Drug Use: No  . Sexually Active: Not Currently    Birth Control/ Protection: IUD   Other Topics Concern  . Not on file   Social History Narrative  . No narrative on file    Review of Systems: As noted HPI; otherwise negative.  Physical Exam: There were no vitals taken for this visit.  General appearance: Alert, cooperative, WN, WD; mild distress  HEENT: Normocephalic, atraumatic, PERRL, mild scleral icterus, no sinus tenderness, oral pharynx moist, pink, patent without exudate, neck supple, trachea  midline, no cervical lymphadenopathy, or thyroimegaly.  Back: Symmetric, no lumbar-sacral or CVA tenderness Resp: CTAB, even and nonlabored. No rhonchi, rales, or wheezes.  Cardio: RRR, S1, S2 normal, no murmur/click/rub/gallop  GI: Soft, non-tender, distended, hypoactive bowel sounds, no organomegaly  Extremities: no cyanosis or edema, some bilateral upper and lower extremity tenderness  Pulses: 2+ and symmetric  Skin: Skin color, texture, turgor normal, no rashes or lesions, tattoos on her upper back  Neurologic: A, O x 3, grossly intact, no focal deficits  Psych: Appropriate affect   Lab results: No results found for this or any previous visit (from the past 24 hour(s)).  Imaging results:  No results found.   Assessment & Plan 1) Sickle Cell Pain with possible Crisis- Retic 32% on adm (pev 32% on 09/30/12); additional labs pending; we will give gentle IV hydration and aggressively treat her pain; her most recent Hgb F 9.3% on 07/07/12 and we will continue daily Hydrea and Folic acid; prn anti-emetic, anti-pruretic, and bowel medication regimen. 2) Acute on Chronic Pain secondary to SCD- initially unable to obtain vascular access and given Dilaudid IM x 1; aggressively treat pain with IV Dilaudid and her home dose long-acting Oxycontin; add IV Toradol; follow   3) Anemia secondary to SCD- most recent Hgb 9.5 on 1/14 following exchange transfusion; baseline appears 8-9; admission labs pending   4) Secondary Hemochromatosis- most recent Ferritin 1612 on 07/09/12; of note, questionable allergy to Desferal but with pre-med (Benadryl and Tylenol) and increasing the duration of infusion, she tolerated IV Desferal during her adm 10/13. She was then discharged on po Exjade; no longer taking; defer to her PCP 5) Situational Stressors- increased risk reactive depression; LCSW to assist in am 6) Disposition- 23 hours OBS then discharge to resume home pain medication regimen   The plan of care for  this patient was discussed with Dr. Gates Rigg, Kasey Ewings S 11/13/2012, 3:11 PM

## 2012-11-14 DIAGNOSIS — M79609 Pain in unspecified limb: Secondary | ICD-10-CM | POA: Diagnosis not present

## 2012-11-14 LAB — CBC WITH DIFFERENTIAL/PLATELET
Basophils Absolute: 0.1 10*3/uL (ref 0.0–0.1)
Eosinophils Absolute: 0.1 10*3/uL (ref 0.0–0.7)
HCT: 21.3 % — ABNORMAL LOW (ref 36.0–46.0)
Hemoglobin: 8.1 g/dL — ABNORMAL LOW (ref 12.0–15.0)
Lymphocytes Relative: 28 % (ref 12–46)
Monocytes Relative: 18 % — ABNORMAL HIGH (ref 3–12)
Neutro Abs: 4.4 10*3/uL (ref 1.7–7.7)
Neutrophils Relative %: 52 % (ref 43–77)
RDW: 24.6 % — ABNORMAL HIGH (ref 11.5–15.5)
WBC: 8.5 10*3/uL (ref 4.0–10.5)

## 2012-11-14 LAB — COMPREHENSIVE METABOLIC PANEL
Alkaline Phosphatase: 157 U/L — ABNORMAL HIGH (ref 39–117)
BUN: 6 mg/dL (ref 6–23)
CO2: 27 mEq/L (ref 19–32)
Calcium: 8.7 mg/dL (ref 8.4–10.5)
GFR calc Af Amer: >90 mL/min (ref >90)
GFR calc non Af Amer: >90 mL/min (ref >90)
Glucose, Bld: 98 mg/dL (ref 70–99)
Total Protein: 7 g/dL (ref 6.0–8.3)

## 2012-11-14 MED ORDER — POTASSIUM CHLORIDE CRYS ER 20 MEQ PO TBCR
40.0000 meq | EXTENDED_RELEASE_TABLET | Freq: Once | ORAL | Status: AC
  Administered 2012-11-14: 40 meq via ORAL
  Filled 2012-11-14: qty 2

## 2012-11-14 NOTE — Discharge Summary (Signed)
[facility] Discharge Summary   Patient ID: Dawn [name] MRN: [id number] DOB/AGE: [birth year] 31 y.o.  Admit date: 11/13/2012 Discharge date: 11/14/2012  Primary Care Physician:  Willey Blade, MD  Admission Diagnoses:  Sickle Cell Disease with possible Crisis  Discharge Diagnoses:   Sickle Cell Disease without Crisis Acute on Chronic Pain secondary to SCD Anemia secondary to SCD Secondary Hemochromatosis  Situational Stressors  Discharge Medications:    Medication List    TAKE these medications       cetirizine 10 MG tablet  Commonly known as:  ZYRTEC  Take 1 tablet (10 mg total) by mouth daily as needed. For allergies     deferasirox 500 MG disintegrating tablet  Commonly known as:  EXJADE  Take 1 tablet (500 mg total) by mouth daily before breakfast.     folic acid 1 MG tablet  Commonly known as:  FOLVITE  Take 1 mg by mouth daily.     hydroxyurea 500 MG capsule  Commonly known as:  HYDREA  Take 500-1,000 mg by mouth as directed. Take 500mg  (1 tablet) ON ODD DAYS OF THE MONTH AND 1000 MG (2) TABLETS ON THE EVEN DAYS.     OxyCODONE 10 mg T12a  Commonly known as:  OXYCONTIN  Take 1 tablet (10 mg total) by mouth every 12 (twelve) hours.     Oxycodone HCl 10 MG Tabs  Take 1 tablet (10 mg total) by mouth every 4 (four) hours as needed.     potassium chloride SA 20 MEQ tablet  Commonly known as:  K-DUR,KLOR-CON  Take 2 tablets (40 mEq total) by mouth daily.     promethazine 12.5 MG tablet  Commonly known as:  PHENERGAN  Take 1 tablet (12.5 mg total) by mouth every 6 (six) hours as needed for nausea. 1-2 prn for nausea begin with 1 if no relief may repeat x's 1     Vitamin D (Ergocalciferol) 50000 UNITS Caps  Commonly known as:  DRISDOL  Take 1 capsule (50,000 Units total) by mouth every 7 (seven) days.         Consults:  None  Significant Diagnostic Studies:  No results found.   [facility] Course:  For complete details  please refer to admission H and P, but in brief, [name] is a 32 yr old AA female with SS Sickle Cell Disease who contacted the [facility] with complaints abrupt onset of constant dull pain to her arms, legs, and knees since Monday 11/10/12. She was also having slight mid-sternal tenderness to palpation only without associated SOB. She rated her pain 7/10 with a normal baseline 0-1/10 at home. She attributed this acute painful episode to stressors at home from multiple unexpected deaths within her church family and community. Her labs on admission revealed an elevated Retic 32.6% and T. Bili 7.7 indicative of active hemolysis but not revealing an acute crisis. Her pain was aggressively treated and improved to a level functional at home. se was discharged in no acute distress and instructed to schedule a follow up appointment with Dr August Saucer within 2 weeks of discharge.   Sickle Cell Pain with possible Crisis- Retic 32.6% on adm (pev 29.9% on 09/30/12) and T. Bili 7.7 as noted above with initial labs obtained. She was given gentle IV hydration. prn anti-emetic, anti-pruretic, bowel medication regimen, and her pain was aggressively treated. Her most recent Hgb F 9.3% on 07/07/12 and with an ANC 4.4, she was kept on her home  dose Hydrea and Folic acid daily.   Acute on Chronic Pain secondary to SCD- initially unable to obtain vascular access, her pain was treated with Dilaudid 4 mg IM x 1. She was then aggressively treated with Dilaudid 2-4 mg IV every 2 hrs prn. She remained on her home dose long-acting Oxycontin and given IV Toradol for acute inflammation associated with SCD. She denied the need for any Rx refills as she normally doesn't take any pain medication on a daily basis. She will, therefore, resume her previous home pain medication regimen at discharge.   Anemia secondary to SCD- Hgb 8.1 and stable at her baseline  Secondary Hemochromatosis- most recent Ferritin 1612 on 07/09/12. She has a questionable  allergy to Desferal but with pre-med (Benadryl and Tylenol) and increasing the duration of infusion, she did tolerate IV Desferal during her adm 10/13. She was then discharged on po Exjade but is no longer taking this. We recommend a repeat Ferritin level in the outpatient setting and defer the decision to resume this treatment to her PCP  Situational Stressors- increased risk reactive depression was identified on admission. We provided emotional support and allowed her to ventilate her feelings as LCSW was not available to assist. She was encouraged to identify support within her church, family, and community.   Physical Exam at Discharge:  BP 133/56  Pulse 105  Temp(Src) 98 F (36.7 C) (Oral)  Resp 16  Ht 5\' 3"  (1.6 m)  Wt 69.854 kg (154 lb)  BMI 27.29 kg/m2  SpO2 92%  General appearance: Alert, cooperative, WN, WD; mild distress  HEENT: Normocephalic, atraumatic, PERRL, mild scleral icterus, no sinus tenderness, oral pharynx moist, pink, patent without exudate, neck supple, trachea midline, no cervical lymphadenopathy, or thyroimegaly.  Back: Symmetric, no lumbar-sacral or CVA tenderness  Resp: CTAB, even and nonlabored. No rhonchi, rales, or wheezes.  Cardio: RRR, S1, S2 normal, no murmur/click/rub/gallop  GI: Soft, non-tender, distended, active bowel sounds, no organomegaly  Extremities: no cyanosis or edema, some bilateral upper and lower extremity tenderness  Pulses: 2+ and symmetric  Skin: Skin color, texture, turgor normal, no rashes or lesions, tattoos on her upper back  Neurologic: A, O x 3, grossly intact, no focal deficits  Psych: Appropriate affect    Disposition at Discharge: 01-Home or Self Care  Discharge Orders:     Discharge Orders   Future Orders Complete By Expires     Discharge patient  As directed        Condition at Discharge:   Stable  Time spent on Discharge:  Greater than 30 minutes.  SignedLyda Perone, Trask Vosler S 11/14/2012, 11:49 AM

## 2012-11-14 NOTE — Progress Notes (Addendum)
Patient ID: Dawn [name], female   DOB: 1980-12-03, 32 y.o.   MRN: [id number] Pt given discharge instructions; all questions answered; pt alert and oriented; IV removed with no complications noted; pt discharged to home

## 2012-11-27 NOTE — H&P (Signed)
Patient seen and examined and discussed with nurse practitioner Vilinda Blanks. I specifically discuss the issue depression the patient and she denies this at this time. The patient denies any suicidal or homicidal ideations and states that she feels that she's very well adjusted without any issues. He does not feel need to speak with the social worker at this time. Otherwise agree with assessment and plan for patient's acute complaints of sickle cell pain.

## 2012-11-27 NOTE — Discharge Summary (Signed)
Patient seen and examined prior to discharge. The patient feels that her pain is at her baseline at this time. I've discussed her care with nurse practitioner and occur and agree with the above assessment and plan to discharge home.

## 2012-12-03 ENCOUNTER — Encounter (HOSPITAL_COMMUNITY): Payer: Self-pay | Admitting: *Deleted

## 2012-12-03 ENCOUNTER — Telehealth (HOSPITAL_COMMUNITY): Payer: Self-pay | Admitting: *Deleted

## 2012-12-03 ENCOUNTER — Inpatient Hospital Stay (HOSPITAL_COMMUNITY)
Admission: AD | Admit: 2012-12-03 | Discharge: 2012-12-11 | DRG: 812 | Disposition: A | Discharge status: Home / Self Care | Payer: Medicare Other | Attending: Internal Medicine | Admitting: Internal Medicine

## 2012-12-03 ENCOUNTER — Emergency Department (HOSPITAL_COMMUNITY)
Admission: EM | Admit: 2012-12-03 | Discharge: 2012-12-03 | Disposition: A | Discharge status: Home / Self Care | Payer: Medicare Other | Source: Home / Self Care | Attending: Emergency Medicine | Admitting: Emergency Medicine

## 2012-12-03 DIAGNOSIS — R52 Pain, unspecified: Secondary | ICD-10-CM | POA: Insufficient documentation

## 2012-12-03 DIAGNOSIS — Z8659 Personal history of other mental and behavioral disorders: Secondary | ICD-10-CM | POA: Insufficient documentation

## 2012-12-03 DIAGNOSIS — D5700 Hb-SS disease with crisis, unspecified: Secondary | ICD-10-CM | POA: Diagnosis not present

## 2012-12-03 DIAGNOSIS — R5081 Fever presenting with conditions classified elsewhere: Secondary | ICD-10-CM | POA: Diagnosis not present

## 2012-12-03 DIAGNOSIS — E876 Hypokalemia: Secondary | ICD-10-CM | POA: Diagnosis present

## 2012-12-03 DIAGNOSIS — Z79899 Other long term (current) drug therapy: Secondary | ICD-10-CM | POA: Insufficient documentation

## 2012-12-03 DIAGNOSIS — R509 Fever, unspecified: Secondary | ICD-10-CM | POA: Diagnosis present

## 2012-12-03 DIAGNOSIS — R609 Edema, unspecified: Secondary | ICD-10-CM | POA: Diagnosis present

## 2012-12-03 DIAGNOSIS — J40 Bronchitis, not specified as acute or chronic: Secondary | ICD-10-CM | POA: Diagnosis present

## 2012-12-03 DIAGNOSIS — D72829 Elevated white blood cell count, unspecified: Secondary | ICD-10-CM | POA: Diagnosis present

## 2012-12-03 DIAGNOSIS — B370 Candidal stomatitis: Secondary | ICD-10-CM | POA: Diagnosis not present

## 2012-12-03 DIAGNOSIS — R110 Nausea: Secondary | ICD-10-CM | POA: Insufficient documentation

## 2012-12-03 DIAGNOSIS — F4321 Adjustment disorder with depressed mood: Secondary | ICD-10-CM | POA: Diagnosis present

## 2012-12-03 DIAGNOSIS — M79609 Pain in unspecified limb: Secondary | ICD-10-CM | POA: Diagnosis not present

## 2012-12-03 DIAGNOSIS — D571 Sickle-cell disease without crisis: Secondary | ICD-10-CM | POA: Diagnosis present

## 2012-12-03 DIAGNOSIS — R079 Chest pain, unspecified: Secondary | ICD-10-CM | POA: Diagnosis not present

## 2012-12-03 LAB — CBC WITH DIFFERENTIAL/PLATELET
Hemoglobin: 8.5 g/dL — ABNORMAL LOW (ref 12.0–15.0)
Lymphocytes Relative: 31 % (ref 12–46)
Lymphs Abs: 3.6 10*3/uL (ref 0.7–4.0)
MCH: 38.6 pg — ABNORMAL HIGH (ref 26.0–34.0)
Monocytes Relative: 13 % — ABNORMAL HIGH (ref 3–12)
Neutro Abs: 6.4 10*3/uL (ref 1.7–7.7)
Neutrophils Relative %: 56 % (ref 43–77)
Platelets: 249 10*3/uL (ref 150–400)
RBC: 2.2 MIL/uL — ABNORMAL LOW (ref 3.87–5.11)
WBC: 11.5 10*3/uL — ABNORMAL HIGH (ref 4.0–10.5)

## 2012-12-03 LAB — RETICULOCYTES
RBC.: 2.2 MIL/uL — ABNORMAL LOW (ref 3.87–5.11)
Retic Count, Absolute: 501.6 10*3/uL — ABNORMAL HIGH (ref 19.0–186.0)
Retic Ct Pct: 22.8 % — ABNORMAL HIGH (ref 0.4–3.1)

## 2012-12-03 LAB — BASIC METABOLIC PANEL
CO2: 23 mEq/L (ref 19–32)
Chloride: 103 mEq/L (ref 96–112)
Glucose, Bld: 102 mg/dL — ABNORMAL HIGH (ref 70–99)
Potassium: 3.4 mEq/L — ABNORMAL LOW (ref 3.5–5.1)
Sodium: 136 mEq/L (ref 135–145)

## 2012-12-03 MED ORDER — PROMETHAZINE HCL 25 MG/ML IJ SOLN
12.5000 mg | INTRAMUSCULAR | Status: DC | PRN
  Administered 2012-12-05 – 2012-12-08 (×8): 12.5 mg via INTRAVENOUS
  Filled 2012-12-03 (×9): qty 1

## 2012-12-03 MED ORDER — ONDANSETRON HCL 4 MG/2ML IJ SOLN: 4.0000 mg | INTRAMUSCULAR | Status: DC | PRN

## 2012-12-03 MED ORDER — DEXTROSE-NACL 5-0.45 % IV SOLN: INTRAVENOUS | Status: DC

## 2012-12-03 MED ORDER — ONDANSETRON HCL 4 MG/2ML IJ SOLN
4.0000 mg | Freq: Once | INTRAMUSCULAR | Status: AC
  Administered 2012-12-03: 4 mg via INTRAVENOUS
  Filled 2012-12-03: qty 2

## 2012-12-03 MED ORDER — POLYETHYLENE GLYCOL 3350 17 G PO PACK
17.0000 g | PACK | Freq: Every day | ORAL | Status: DC | PRN
  Filled 2012-12-03: qty 1

## 2012-12-03 MED ORDER — ONDANSETRON HCL 8 MG PO TABS: 4.0000 mg | ORAL_TABLET | ORAL | Status: DC | PRN

## 2012-12-03 MED ORDER — DEXTROSE 5 % IV SOLN
2000.0000 mg | Freq: Every day | INTRAVENOUS | Status: DC
  Administered 2012-12-03 – 2012-12-10 (×8): 2000 mg via INTRAVENOUS
  Filled 2012-12-03 (×10): qty 2

## 2012-12-03 MED ORDER — HYDROMORPHONE HCL PF 2 MG/ML IJ SOLN: 4.0000 mg | Freq: Once | INTRAMUSCULAR | Status: AC

## 2012-12-03 MED ORDER — HYDROMORPHONE HCL PF 1 MG/ML IJ SOLN
INTRAMUSCULAR | Status: AC
  Administered 2012-12-03: 1 mg via INTRAVENOUS
  Filled 2012-12-03: qty 1

## 2012-12-03 MED ORDER — HYDROMORPHONE HCL PF 4 MG/ML IJ SOLN
INTRAMUSCULAR | Status: AC
  Administered 2012-12-03: 4 mg via INTRAMUSCULAR
  Filled 2012-12-03: qty 1

## 2012-12-03 MED ORDER — FOLIC ACID 1 MG PO TABS
1.0000 mg | ORAL_TABLET | Freq: Every day | ORAL | Status: DC
  Administered 2012-12-04 – 2012-12-11 (×8): 1 mg via ORAL
  Filled 2012-12-03 (×8): qty 1

## 2012-12-03 MED ORDER — POTASSIUM CHLORIDE CRYS ER 20 MEQ PO TBCR
40.0000 meq | EXTENDED_RELEASE_TABLET | Freq: Every day | ORAL | Status: DC
  Administered 2012-12-03 – 2012-12-05 (×3): 40 meq via ORAL
  Filled 2012-12-03 (×3): qty 2

## 2012-12-03 MED ORDER — KETOROLAC TROMETHAMINE 30 MG/ML IJ SOLN
30.0000 mg | Freq: Once | INTRAMUSCULAR | Status: AC
  Administered 2012-12-03: 30 mg via INTRAVENOUS
  Filled 2012-12-03: qty 1

## 2012-12-03 MED ORDER — LORATADINE 10 MG PO TABS
10.0000 mg | ORAL_TABLET | Freq: Every day | ORAL | Status: DC
  Administered 2012-12-04 – 2012-12-11 (×8): 10 mg via ORAL
  Filled 2012-12-03 (×9): qty 1

## 2012-12-03 MED ORDER — HYDROMORPHONE HCL PF 2 MG/ML IJ SOLN
4.0000 mg | INTRAMUSCULAR | Status: DC
  Administered 2012-12-03 – 2012-12-08 (×49): 4 mg via INTRAVENOUS
  Filled 2012-12-03 (×49): qty 2

## 2012-12-03 MED ORDER — HYDROMORPHONE HCL PF 1 MG/ML IJ SOLN
1.0000 mg | Freq: Once | INTRAMUSCULAR | Status: AC
  Administered 2012-12-03: 1 mg via INTRAVENOUS
  Filled 2012-12-03: qty 1

## 2012-12-03 MED ORDER — SODIUM CHLORIDE 0.9 % IV BOLUS (SEPSIS)
1000.0000 mL | Freq: Once | INTRAVENOUS | Status: AC
  Administered 2012-12-03: 1000 mL via INTRAVENOUS

## 2012-12-03 MED ORDER — SENNA 8.6 MG PO TABS
1.0000 | ORAL_TABLET | Freq: Two times a day (BID) | ORAL | Status: DC
  Administered 2012-12-03 – 2012-12-11 (×15): 8.6 mg via ORAL
  Filled 2012-12-03 (×15): qty 1

## 2012-12-03 MED ORDER — HYDROXYUREA 500 MG PO CAPS
500.0000 mg | ORAL_CAPSULE | Freq: Every morning | ORAL | Status: DC
  Administered 2012-12-04: 500 mg via ORAL
  Filled 2012-12-03 (×3): qty 1

## 2012-12-03 MED ORDER — OXYCODONE HCL ER 10 MG PO T12A
10.0000 mg | EXTENDED_RELEASE_TABLET | Freq: Two times a day (BID) | ORAL | Status: DC
  Administered 2012-12-03 – 2012-12-11 (×16): 10 mg via ORAL
  Filled 2012-12-03 (×16): qty 1

## 2012-12-03 MED ORDER — DIPHENHYDRAMINE HCL 50 MG/ML IJ SOLN
12.5000 mg | INTRAMUSCULAR | Status: DC | PRN
  Administered 2012-12-05 – 2012-12-08 (×3): 25 mg via INTRAVENOUS
  Filled 2012-12-03 (×3): qty 1

## 2012-12-03 MED ORDER — HYDROMORPHONE HCL PF 2 MG/ML IJ SOLN
3.0000 mg | INTRAMUSCULAR | Status: DC
  Filled 2012-12-03: qty 1
  Filled 2012-12-03: qty 2

## 2012-12-03 MED ORDER — DEXTROSE-NACL 5-0.45 % IV SOLN
INTRAVENOUS | Status: DC
  Administered 2012-12-03 – 2012-12-11 (×8): via INTRAVENOUS

## 2012-12-03 MED ORDER — KETOROLAC TROMETHAMINE 30 MG/ML IJ SOLN
30.0000 mg | Freq: Four times a day (QID) | INTRAMUSCULAR | Status: AC
  Administered 2012-12-03 – 2012-12-08 (×19): 30 mg via INTRAVENOUS
  Filled 2012-12-03 (×25): qty 1

## 2012-12-03 MED ORDER — PROMETHAZINE HCL 25 MG PO TABS
12.5000 mg | ORAL_TABLET | ORAL | Status: DC | PRN
  Administered 2012-12-03 – 2012-12-09 (×5): 25 mg via ORAL
  Filled 2012-12-03 (×5): qty 1

## 2012-12-03 MED ORDER — PROMETHAZINE HCL 25 MG PO TABS
25.0000 mg | ORAL_TABLET | Freq: Once | ORAL | Status: AC
  Administered 2012-12-03: 25 mg via ORAL
  Filled 2012-12-03: qty 1

## 2012-12-03 MED ORDER — DIPHENHYDRAMINE HCL 25 MG PO CAPS
25.0000 mg | ORAL_CAPSULE | ORAL | Status: DC | PRN
  Administered 2012-12-03: 50 mg via ORAL
  Filled 2012-12-03: qty 2

## 2012-12-03 NOTE — ED Provider Notes (Signed)
History     CSN: [id number]  Arrival date & time 12/03/12  4098   First MD Initiated Contact with Patient 12/03/12 0423      Chief Complaint  Patient presents with  . Sickle Cell Pain Crisis    (Consider location/radiation/quality/duration/timing/severity/associated sxs/prior treatment) HPI Kc L Novosad is a 32 y.o. female who presents to the Emergency Department complaining of sickle cell crisis with generalized pain and nausea. She is on oxycontin and oxycodone IR both of which she took prior to coming to the ER.  PCP Dr. August Saucer  Past Medical History  Diagnosis Date  . Sickle cell disease   . Sickle cell disease, type S   . Blood transfusion   . 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 Cholecystectomy    .  left knee acl reconstruction    . Cesarean section      x 2  . Portacath placement      History reviewed. No pertinent family history.  History  Substance Use Topics  . Smoking status: Never Smoker   . Smokeless tobacco: Not on file  . Alcohol Use: No    OB History   Grav Para Term Preterm Abortions TAB SAB Ect Mult Living                  Review of Systems  Constitutional: Negative for fever.       10 Systems reviewed and are negative for acute change except as noted in the HPI.  HENT: Negative for congestion.   Eyes: Negative for discharge and redness.  Respiratory: Negative for cough and shortness of breath.   Cardiovascular: Negative for chest pain.  Gastrointestinal: Negative for vomiting and abdominal pain.  Musculoskeletal: Negative for back pain.       Arm, leg and back pain  Skin: Negative for rash.  Neurological: Negative for syncope, numbness and headaches.  Psychiatric/Behavioral:       No behavior change.    Allergies  Desferal; Latex; Lisinopril; Tape; and Tylenol  Home Medications   Current Outpatient Rx  Name  Route  Sig  Dispense  Refill  . folic acid (FOLVITE) 1 MG  tablet   Oral   Take 1 mg by mouth daily.           . hydroxyurea (HYDREA) 500 MG capsule   Oral   Take 500-1,000 mg by mouth as directed. Take 500mg  (1 tablet) ON ODD DAYS OF THE MONTH AND 1000 MG (2) TABLETS ON THE EVEN DAYS.         . OxyCODONE (OXYCONTIN) 10 mg TB12   Oral   Take 1 tablet (10 mg total) by mouth every 12 (twelve) hours.   60 tablet   0   . Oxycodone HCl 10 MG TABS   Oral   Take 1 tablet (10 mg total) by mouth every 4 (four) hours as needed.   45 tablet   0   . potassium chloride SA (K-DUR,KLOR-CON) 20 MEQ tablet   Oral   Take 2 tablets (40 mEq total) by mouth daily.   60 tablet   0   . promethazine (PHENERGAN) 12.5 MG tablet   Oral   Take 1 tablet (12.5 mg total) by mouth every 6 (six) hours as needed for nausea. 1-2 prn for nausea begin with 1 if no relief may repeat x's 1   30 tablet   0   . Vitamin D, Ergocalciferol, (DRISDOL)  50000 UNITS CAPS   Oral   Take 1 capsule (50,000 Units total) by mouth every 7 (seven) days.   11 capsule   0   . cetirizine (ZYRTEC) 10 MG tablet   Oral   Take 1 tablet (10 mg total) by mouth daily as needed. For allergies   30 tablet   2   . deferasirox (EXJADE) 500 MG disintegrating tablet   Oral   Take 1 tablet (500 mg total) by mouth daily before breakfast.   30 tablet   3     BP 133/93  Pulse 91  Temp(Src) 98.6 F (37 C) (Oral)  Ht 5\' 3"  (1.6 m)  Wt 154 lb (69.854 kg)  BMI 27.29 kg/m2  SpO2 98%  Physical Exam  Nursing note and vitals reviewed. Constitutional: She appears well-developed and well-nourished.  Awake, alert, nontoxic appearance.  HENT:  Head: Normocephalic and atraumatic.  Right Ear: External ear normal.  Left Ear: External ear normal.  Mouth/Throat: Oropharynx is clear and moist.  Eyes: EOM are normal. Pupils are equal, round, and reactive to light. Right eye exhibits no discharge. Left eye exhibits no discharge.  Neck: Normal range of motion. Neck supple.  Cardiovascular:  Normal rate and normal heart sounds.   Pulmonary/Chest: Effort normal and breath sounds normal. She exhibits no tenderness.  Abdominal: Soft. Bowel sounds are normal. There is no tenderness. There is no rebound.  Musculoskeletal: She exhibits no tenderness.  Baseline ROM, no obvious new focal weakness.  Neurological:  Mental status and motor strength appears baseline for patient and situation.  Skin: No rash noted.  Psychiatric: She has a normal mood and affect.    ED Course  Procedures (including critical care time) Results for orders placed during the hospital encounter of 12/03/12  CBC WITH DIFFERENTIAL      Result Value Range   WBC 11.5 (*) 4.0 - 10.5 K/uL   RBC 2.20 (*) 3.87 - 5.11 MIL/uL   Hemoglobin 8.5 (*) 12.0 - 15.0 g/dL   HCT 98.1 (*) 19.1 - 47.8 %   MCV 104.5 (*) 78.0 - 100.0 fL   MCH 38.6 (*) 26.0 - 34.0 pg   MCHC 37.0 (*) 30.0 - 36.0 g/dL   RDW 29.5 (*) 62.1 - 30.8 %   Platelets 249  150 - 400 K/uL   Neutrophils Relative 56  43 - 77 %   Neutro Abs 6.4  1.7 - 7.7 K/uL   Lymphocytes Relative 31  12 - 46 %   Lymphs Abs 3.6  0.7 - 4.0 K/uL   Monocytes Relative 13 (*) 3 - 12 %   Monocytes Absolute 1.5 (*) 0.1 - 1.0 K/uL   Eosinophils Relative 1  0 - 5 %   Eosinophils Absolute 0.1  0.0 - 0.7 K/uL   Basophils Relative 0  0 - 1 %   Basophils Absolute 0.0  0.0 - 0.1 K/uL   RBC Morphology SICKLE CELLS    BASIC METABOLIC PANEL      Result Value Range   Sodium 136  135 - 145 mEq/L   Potassium 3.4 (*) 3.5 - 5.1 mEq/L   Chloride 103  96 - 112 mEq/L   CO2 23  19 - 32 mEq/L   Glucose, Bld 102 (*) 70 - 99 mg/dL   BUN 5 (*) 6 - 23 mg/dL   Creatinine, Ser 6.57  0.50 - 1.10 mg/dL   Calcium 8.7  8.4 - 84.6 mg/dL   GFR calc non Af Amer >90  >  90 mL/min   GFR calc Af Amer >90  >90 mL/min  RETICULOCYTES      Result Value Range   Retic Ct Pct 22.8 (*) 0.4 - 3.1 %   RBC. 2.20 (*) 3.87 - 5.11 MIL/uL   Retic Count, Manual 501.6 (*) 19.0 - 186.0 K/uL     0612 Patient is  feeling better. Pain has resolved.  MDM  Patient with sickle cell crisis with pain to arms, legs and back. Retic count is up, Hgb is down slightly. Given IVF, dilaudid, toradol, and zofran.  Pt feels improved after observation and/or treatment in ED.Pt stable in ED with no significant deterioration in condition.The patient appears reasonably screened and/or stabilized for discharge and I doubt any other medical condition or other [facility] requiring further screening, evaluation, or treatment in the ED at this time prior to discharge.  MDM Reviewed: nursing note and vitals Interpretation: labs           Nicoletta Dress. Colon Branch, MD 12/03/12 [phone]

## 2012-12-03 NOTE — ED Notes (Signed)
Patient complaining of pain all over her body

## 2012-12-03 NOTE — H&P (Signed)
[facility] History and Physical   Date: 12/03/2012  Patient name: Dawn [name] Medical record number: [id number] Date of birth: 08-06-1981 Age: 32 y.o. Gender: female PCP: August Saucer, ERIC, MD  Attending physician: Gwenyth Bender, MD  Chief Complaint: Pain arms, legs hips and lower back   History of Present Illness: [name] is a 32 yr old African American  female with SS Sickle Cell Disease who was discharged from [facility] hospital at 0700 this AM. States at time of d/c her pain was 10/10. She went home took her pain medication as prescribed with no relief as her pain was escalating. She than proceeded to contacted the [facility] . Her complaint is  Gradual onset for 24 hrs constant spasm  pain to her arms, hips, legs, and lower back . She was originally ex[periencing mild pain than went on a filed trip with her son and the pain became worst.  She currently denies fever, chills, headache, dizziness, chest pain, SOB, palpitations, nausea, vomiting, diarrhea, abdominal pain, dysuria, hematuria, urinary urgency or frequency, and constipation with last BM yesterday morning. Pain continues to be 10/10 delay in treatment due to unable to obtain IV access.   Meds:   Meds: Prescriptions prior to admission  Medication Sig Dispense Refill  . cetirizine (ZYRTEC) 10 MG tablet Take 1 tablet (10 mg total) by mouth daily as needed. For allergies  30 tablet  2  . deferasirox (EXJADE) 500 MG disintegrating tablet Take 1 tablet (500 mg total) by mouth daily before breakfast.  30 tablet  3  . folic acid (FOLVITE) 1 MG tablet Take 1 mg by mouth daily.        . hydroxyurea (HYDREA) 500 MG capsule Take 500-1,000 mg by mouth as directed. Take 500mg  (1 tablet) ON ODD DAYS OF THE MONTH AND 1000 MG (2) TABLETS ON THE EVEN DAYS.      . OxyCODONE (OXYCONTIN) 10 mg TB12 Take 1 tablet (10 mg total) by mouth every 12 (twelve) hours.  60 tablet  0  . Oxycodone HCl 10 MG TABS Take 1 tablet (10 mg total) by mouth every  4 (four) hours as needed.  45 tablet  0  . potassium chloride SA (K-DUR,KLOR-CON) 20 MEQ tablet Take 2 tablets (40 mEq total) by mouth daily.  60 tablet  0  . promethazine (PHENERGAN) 12.5 MG tablet Take 1 tablet (12.5 mg total) by mouth every 6 (six) hours as needed for nausea. 1-2 prn for nausea begin with 1 if no relief may repeat x's 1  30 tablet  0  . Vitamin D, Ergocalciferol, (DRISDOL) 50000 UNITS CAPS Take 1 capsule (50,000 Units total) by mouth every 7 (seven) days.  11 capsule  0    Allergies: Desferal; Latex; Lisinopril; Tape; and Tylenol Past Medical History  Diagnosis Date  . Sickle cell disease   . Sickle cell disease, type S   . Blood transfusion   . 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 Cholecystectomy    .  left knee acl reconstruction    . Cesarean section      x 2  . Portacath placement     No family history on file. History   Social History  . Marital Status: Single    Spouse Name: N/A    Number of Children: N/A  . Years of Education: N/A   Occupational History  . Not on file.   Social History Main  Topics  . Smoking status: Never Smoker   . Smokeless tobacco: Not on file  . Alcohol Use: No  . Drug Use: No  . Sexually Active: Not Currently    Birth Control/ Protection: IUD   Other Topics Concern  . Not on file   Social History Narrative  . No narrative on file    Review of Systems: A comprehensive review of systems was negative except for: Musculoskeletal: positive for back pain, hip,  legs   Physical Exam: Blood pressure 141/83, pulse 118, temperature 98.8 F (37.1 C), temperature source Oral, resp. rate 20, SpO2 96.00%. BP 141/83  Pulse 118  Temp(Src) 98.8 F (37.1 C) (Oral)  Resp 20  SpO2 96%  General Appearance:    Alert, cooperative, no distress, appears stated age  Head:    Normocephalic, without obvious abnormality, atraumatic  Eyes:    PERRL, conjunctiva/corneas  clear, EOM's intact, fundi    benign, both eyes  Ears:    Normal TM's and external ear canals, both ears  Nose:   Nares normal, septum midline, mucosa normal, no drainage    or sinus tenderness  Throat:   Lips, mucosa, and tongue normal; teeth and gums normal  Neck:   Supple, symmetrical, trachea midline, no adenopathy;    thyroid:  no enlargement/tenderness/nodules; no carotid   bruit or JVD  Back:     Symmetric, no curvature, ROM normal, no CVA tenderness  Lungs:     Clear to auscultation bilaterally, respirations unlabored  Chest Wall:    No tenderness or deformity   Heart:    Regular rate and rhythm, S1 and S2 normal, no murmur, rub   or gallop  Breast Exam:    No tenderness, masses, or nipple abnormality  Abdomen:     Soft, non-tender, bowel sounds active all four quadrants,    no masses, no organomegaly        Extremities:   Extremities normal, atraumatic, no cyanosis or edema  Pulses:   2+ and symmetric all extremities  Skin:   Skin color, texture, turgor normal, no rashes or lesions  Lymph nodes:   Cervical, supraclavicular, and axillary nodes normal  Neurologic:   CNII-XII intact, normal strength, sensation and reflexes    throughout    Lab results: Results for orders placed during the hospital encounter of 12/03/12 (from the past 24 hour(s))  CBC WITH DIFFERENTIAL     Status: Abnormal   Collection Time    12/03/12  4:24 AM      Result Value Range   WBC 11.5 (*) 4.0 - 10.5 K/uL   RBC 2.20 (*) 3.87 - 5.11 MIL/uL   Hemoglobin 8.5 (*) 12.0 - 15.0 g/dL   HCT 09.8 (*) 11.9 - 14.7 %   MCV 104.5 (*) 78.0 - 100.0 fL   MCH 38.6 (*) 26.0 - 34.0 pg   MCHC 37.0 (*) 30.0 - 36.0 g/dL   RDW 82.9 (*) 56.2 - 13.0 %   Platelets 249  150 - 400 K/uL   Neutrophils Relative 56  43 - 77 %   Neutro Abs 6.4  1.7 - 7.7 K/uL   Lymphocytes Relative 31  12 - 46 %   Lymphs Abs 3.6  0.7 - 4.0 K/uL   Monocytes Relative 13 (*) 3 - 12 %   Monocytes Absolute 1.5 (*) 0.1 - 1.0 K/uL   Eosinophils  Relative 1  0 - 5 %   Eosinophils Absolute 0.1  0.0 - 0.7 K/uL   Basophils  Relative 0  0 - 1 %   Basophils Absolute 0.0  0.0 - 0.1 K/uL   RBC Morphology SICKLE CELLS    BASIC METABOLIC PANEL     Status: Abnormal   Collection Time    12/03/12  4:24 AM      Result Value Range   Sodium 136  135 - 145 mEq/L   Potassium 3.4 (*) 3.5 - 5.1 mEq/L   Chloride 103  96 - 112 mEq/L   CO2 23  19 - 32 mEq/L   Glucose, Bld 102 (*) 70 - 99 mg/dL   BUN 5 (*) 6 - 23 mg/dL   Creatinine, Ser 1.61  0.50 - 1.10 mg/dL   Calcium 8.7  8.4 - 09.6 mg/dL   GFR calc non Af Amer >90  >90 mL/min   GFR calc Af Amer >90  >90 mL/min  RETICULOCYTES     Status: Abnormal   Collection Time    12/03/12  4:24 AM      Result Value Range   Retic Ct Pct 22.8 (*) 0.4 - 3.1 %   RBC. 2.20 (*) 3.87 - 5.11 MIL/uL   Retic Count, Manual 501.6 (*) 19.0 - 186.0 K/uL    Imaging results:  No results found.   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

## 2012-12-03 NOTE — Telephone Encounter (Signed)
Received patient call c/o pain to Bil arms, bil legs, lower back, starting yesterday and worsening at 2-3 am, and went to ED. Per patient received 1 dose of toradol in ED and was discharged home. Also c/o anxiety attack feeling this morning as well. Taking Oxy IR and oxycontin this AM with no help and phenergan PO for nausea. Denies all other pertinent  ROS per [facility] triage assessment. Informed patient that I would notify Provider and call back with recommendation. Patient acknowledges.

## 2012-12-04 ENCOUNTER — Non-Acute Institutional Stay (HOSPITAL_COMMUNITY): Payer: Medicare Other

## 2012-12-04 LAB — COMPREHENSIVE METABOLIC PANEL
AST: 82 U/L — ABNORMAL HIGH (ref 0–37)
Albumin: 2.8 g/dL — ABNORMAL LOW (ref 3.5–5.2)
Calcium: 8.6 mg/dL (ref 8.4–10.5)
Chloride: 104 mEq/L (ref 96–112)
Creatinine, Ser: 0.67 mg/dL (ref 0.50–1.10)
Total Protein: 6.8 g/dL (ref 6.0–8.3)

## 2012-12-04 LAB — CBC WITH DIFFERENTIAL/PLATELET
Basophils Relative: 1 % (ref 0–1)
Eosinophils Absolute: 0 10*3/uL (ref 0.0–0.7)
Hemoglobin: 7.4 g/dL — ABNORMAL LOW (ref 12.0–15.0)
Lymphocytes Relative: 24 % (ref 12–46)
MCHC: 38.5 g/dL — ABNORMAL HIGH (ref 30.0–36.0)
Neutrophils Relative %: 63 % (ref 43–77)
RBC: 1.94 MIL/uL — ABNORMAL LOW (ref 3.87–5.11)

## 2012-12-04 LAB — URINALYSIS, ROUTINE W REFLEX MICROSCOPIC
Glucose, UA: NEGATIVE mg/dL
Specific Gravity, Urine: 1.014 (ref 1.005–1.030)
pH: 5 (ref 5.0–8.0)

## 2012-12-04 LAB — URINE MICROSCOPIC-ADD ON

## 2012-12-04 MED ORDER — SODIUM CHLORIDE 0.9 % IJ SOLN
10.0000 mL | Freq: Two times a day (BID) | INTRAMUSCULAR | Status: DC
  Administered 2012-12-05: 40 mL
  Administered 2012-12-05: 10 mL
  Administered 2012-12-06: 40 mL
  Administered 2012-12-07 – 2012-12-11 (×4): 10 mL

## 2012-12-04 MED ORDER — SODIUM CHLORIDE 0.9 % IJ SOLN
10.0000 mL | INTRAMUSCULAR | Status: DC | PRN
  Administered 2012-12-10: 10 mL

## 2012-12-04 NOTE — Progress Notes (Signed)
SICKLE CELL SERVICE PROGRESS NOTE  [facility] KGM:[id number] DOB: Dec 16, 1980 DOA: 32/08/2013 PCP: August Saucer ERIC, MD  Assessment/Plan:  Hypoxia: Change in condition desaturations with increase chest wall pain. Chest x-rayed order results negative. CBC results did show a slight drop in HGB. Probably not dilutional due to IV access site IVF where at 50cc/hr. Transfer to inpatient service for further evaluation. Sickle Cell Pain with possible Crisis: Scheduled IV hydromorphone 4 mg  and Toradol IV 30 mg for adjunct for controlling pain. She is receiving her home medication long acting MScontin .  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 Place an order to type and cross hx of antibodies causing difficult to match. If HGB continues to declines she will need to receive 1 unit of PRBC  Acute on Chronic Pain secondary to SCD: initially unable to obtain vascular access and given Dilaudid IM x 1; treat pain with IV Dilaudid When accessible, hydration, antiemetics, antipruitics and her home dose long-acting Oxycontin  Leucocytosis: Secondary to bone marrow turn over and inflammatory component. There is no evidence of infection. He remains afebrile. Toradol 30mg  IV added for inflammatory process. Continue to monitor.  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 Secondary Hemochromatosis:  will receive nightly Desferal  Code Status: Full  Family Communication:  None ( family member at the bedside) Disposition Plan: home when stable   EDWARDS, MICHELLE P    12/04/2012, 9:12 PM  LOS: 1 day   Brief narrative: Ms. Lynnett Langlinais is a 32 yr old [name] with SS Sickle Cell Disease who was discharged from [facility] hospital at 0700 this AM. States at time of d/c her pain was 10/10. She went home took her pain medication as prescribed with no relief as her pain was escalating.  She than proceeded to contacted the [facility] . Her complaint is Gradual onset for 24 hrs constant spasm pain to her arms, hips, legs, and lower back . She was originally ex[periencing mild pain than went on a filed trip with her son and the pain became worst. She currently denies fever, chills, headache, dizziness, chest pain, SOB, palpitations, nausea, vomiting, diarrhea, abdominal pain, dysuria, hematuria, urinary urgency or frequency, and constipation with last BM yesterday morning. Pain continues to be 10/10 delay in treatment due to unable to obtain IV access.      Consultants:  None   Procedures:  Chest xray  Urinalysis   HPI/Subjective: [name] was seen today and a change in status last night and this morning she has desaturation several times with increased shortness of breath. She is lying ing the be in acute distress ball up in a ball moaning. Pain is not being effectively controlled and with a change in condition she will be transferred to inpatient service.   Objective: Filed Vitals:   12/04/12 1100 12/04/12 1445 12/04/12 1609 12/04/12 1759  BP:  127/73 132/69 121/63  Pulse:  114 116 124  Temp:  99.7 F (37.6 C) 99.7 F (37.6 C) 99.7 F (37.6 C)  TempSrc:  Oral Oral Oral  Resp:  18 16 16   Height:   5\' 2"  (1.575 m)   Weight:   69.854 kg (154 lb)   SpO2: 92% 94% 94% 93%   Weight change:   Intake/Output Summary (Last 24 hours) at 12/04/12 2112 Last data filed at 12/04/12 1800  Gross per 24 hour  Intake   1055 ml  Output    300 ml  Net    755 ml    General: Alert, awake, oriented x3, in no acute distress.  HEENT: [place]/AT PEERL, EOMI Neck: Trachea midline,  no masses, no thyromegal,y no JVD, no carotid bruit OROPHARYNX:  Moist, No exudate/ erythema/lesions.  Heart: Regular rate and rhythm, without murmurs, rubs, gallops, PMI non-displaced, no heaves or thrills on palpation.  Lungs: Clear to auscultation, no wheezing or rhonchi noted. No increased vocal fremitus  resonant to percussion  Abdomen: Soft, nontender, nondistended, positive bowel sounds, no masses no hepatosplenomegaly noted..  Neuro: No focal neurological deficits noted cranial nerves II through XII grossly intact. DTRs 2+ bilaterally upper and lower extremities. Strength 5 out of 5 in bilateral upper and lower extremities. Musculoskeletal: No warm swelling or erythema around joints, no spinal tenderness noted. Psychiatric: Patient alert and oriented x3, good insight and cognition, good recent to remote recall. Lymph node survey: No cervical axillary or inguinal lymphadenopathy noted.   Data Reviewed: Basic Metabolic Panel:  Recent Labs Lab 12/03/12 0424 12/04/12 1615  NA 136 137  K 3.4* 4.3  CL 103 104  CO2 23 26  GLUCOSE 102* 92  BUN 5* 12  CREATININE 0.54 0.67  CALCIUM 8.7 8.6   Liver Function Tests:  Recent Labs Lab 12/04/12 1615  AST 82*  ALT 34  ALKPHOS 142*  BILITOT 9.8*  PROT 6.8  ALBUMIN 2.8*   No results found for this basename: LIPASE, AMYLASE,  in the last 168 hours No results found for this basename: AMMONIA,  in the last 168 hours CBC:  Recent Labs Lab 12/03/12 0424 12/04/12 1130  WBC 11.5* 10.6*  NEUTROABS 6.4 6.7  HGB 8.5* 7.4*  HCT 23.0* 19.2*  MCV 104.5* 99.0  PLT 249 207   Cardiac Enzymes: No results found for this basename: CKTOTAL, CKMB, CKMBINDEX, TROPONINI,  in the last 168 hours BNP (last 3 results)  Recent Labs  03/25/12 0847  PROBNP 255.2*   CBG: No results found for this basename: GLUCAP,  in the last 168 hours  No results found for this or any previous visit (from the past 240 hour(s)).   Studies: Dg Chest 2 View  12/04/2012  *RADIOLOGY REPORT*  Clinical Data: Sickle cell, chest pain.  CHEST - 2 VIEW  Comparison: 07/06/2012.  Findings: Trachea is midline.  Heart size normal.  Lungs are clear. No pleural fluid.  IMPRESSION: No acute findings.   Original Report Authenticated By: Leanna Battles, M.D.     Scheduled  Meds: . deferoxamine (DESFERAL) IV  2,000 mg Intravenous QHS  . folic acid  1 mg Oral Daily  .  HYDROmorphone (DILAUDID) injection  4 mg Intravenous Q2H  . hydroxyurea  500 mg Oral q morning - 10a  . ketorolac  30 mg Intravenous Q6H  . loratadine  10 mg Oral Daily  . OxyCODONE  10 mg Oral Q12H  . potassium chloride SA  40 mEq Oral Daily  . senna  1 tablet Oral BID  . sodium chloride  10-40 mL Intracatheter Q12H   Continuous Infusions: . dextrose 5 % and 0.45% NaCl 50 mL/hr at 12/04/12 1900    Gwinda Passe, NP-C

## 2012-12-04 NOTE — Progress Notes (Signed)
Patient resting comfortably from 2a.m.-7a.m.  Still C/O pain to bilateral legs and back that is 7/10.  Scheduled pain medications given per order.  No N/V during this time period.  Patient up to bathroom to void with standby assist.  No acute changes, continue to monitor.

## 2012-12-04 NOTE — Progress Notes (Signed)
Patient to be admitted to inpatient room 1318. Report given to Gaffer. Patient in no apparent distress, VSS, Patent IV site. Patient escorted on O2  Via wheelchair with belongings to Room 1318 by nursing staff. Patient resting comfortably in bed. Connected O2 to humidifier and to patient. Medications left with RN at bedside.

## 2012-12-04 NOTE — Progress Notes (Signed)
Peripherally Inserted Central Catheter/Midline Placement  The IV Nurse has discussed with the patient and/or persons authorized to consent for the patient, the purpose of this procedure and the potential benefits and risks involved with this procedure.  The benefits include less needle sticks, lab draws from the catheter and patient may be discharged home with the catheter.  Risks include, but not limited to, infection, bleeding, blood clot (thrombus formation), and puncture of an artery; nerve damage and irregular heat beat.  Alternatives to this procedure were also discussed.  PICC/Midline Placement Documentation        Lisabeth Devoid 12/04/2012, 6:03 PM

## 2012-12-04 NOTE — Progress Notes (Signed)
Shift note: Patient's pain 6-7/10 on scale of 0-10, generalized bil hips, legs and low back, patient stated no longer having pain in arms. Medications administered as ordered.   10:28 patient complained of light headedness and shallow breathing. BP increased from 127/73 to 146/76 HR increased 90 to 118, T elevated from 99.0 to 99.4, O2 sats 86-88 % on RA, 88-92% on 2L O2.  Gwinda Passe NP notified and order for CXR and CBC  Placed. Bed request made, awaiting avilability.   11:35 Patient transported to and from radiology for CXR  Patient NAD, currently stable, report given to oncoming RN   This RN will continue to monitor.

## 2012-12-05 ENCOUNTER — Inpatient Hospital Stay (HOSPITAL_COMMUNITY): Payer: Medicare Other

## 2012-12-05 DIAGNOSIS — M79609 Pain in unspecified limb: Secondary | ICD-10-CM

## 2012-12-05 DIAGNOSIS — R509 Fever, unspecified: Secondary | ICD-10-CM

## 2012-12-05 LAB — CBC WITH DIFFERENTIAL/PLATELET
Basophils Relative: 0 % (ref 0–1)
Eosinophils Relative: 0 % (ref 0–5)
Hemoglobin: 5.8 g/dL — CL (ref 12.0–15.0)
MCH: 37.4 pg — ABNORMAL HIGH (ref 26.0–34.0)
MCV: 97.4 fL (ref 78.0–100.0)
Monocytes Absolute: 1.7 10*3/uL — ABNORMAL HIGH (ref 0.1–1.0)
Monocytes Relative: 12 % (ref 3–12)
Neutrophils Relative %: 63 % (ref 43–77)
RBC: 1.55 MIL/uL — ABNORMAL LOW (ref 3.87–5.11)
WBC: 14 10*3/uL — ABNORMAL HIGH (ref 4.0–10.5)

## 2012-12-05 LAB — PREPARE RBC (CROSSMATCH)

## 2012-12-05 MED ORDER — PIPERACILLIN-TAZOBACTAM 3.375 G IVPB
3.3750 g | Freq: Three times a day (TID) | INTRAVENOUS | Status: DC
  Administered 2012-12-05 – 2012-12-07 (×6): 3.375 g via INTRAVENOUS
  Filled 2012-12-05 (×7): qty 50

## 2012-12-05 MED ORDER — SALINE SPRAY 0.65 % NA SOLN
1.0000 | NASAL | Status: DC | PRN
  Administered 2012-12-06: 1 via NASAL
  Filled 2012-12-05: qty 44

## 2012-12-05 MED ORDER — ENOXAPARIN SODIUM 40 MG/0.4ML ~~LOC~~ SOLN
40.0000 mg | SUBCUTANEOUS | Status: DC
  Administered 2012-12-05 – 2012-12-11 (×7): 40 mg via SUBCUTANEOUS
  Filled 2012-12-05 (×7): qty 0.4

## 2012-12-05 MED ORDER — VANCOMYCIN HCL 1000 MG IV SOLR
750.0000 mg | Freq: Three times a day (TID) | INTRAVENOUS | Status: DC
  Administered 2012-12-05 – 2012-12-07 (×6): 750 mg via INTRAVENOUS
  Filled 2012-12-05 (×7): qty 750

## 2012-12-05 MED ORDER — ACETAMINOPHEN 325 MG PO TABS: 650.0000 mg | ORAL_TABLET | Freq: Four times a day (QID) | ORAL | Status: DC | PRN

## 2012-12-05 NOTE — Progress Notes (Signed)
[facility] PROGRESS NOTE  Dawn [name] ZOX:[id number] DOB: [birth year] DOA: 12/03/2012 PCP: August Saucer ERIC, MD  Assessment/Plan: Sickle Cell Pain with Crisis- Patient with sickle cell crisis with active hemolysis. Continue the Dilaudid 4 mg IV every 2 hours, IV Toradol 30 mg daily every 6 hours secondary to the Inflammatory process  component with sickle cell crisis, OxyContin 10 mg every 12 hours. Hold hydroxyurea secondary to low hemoglobin.   Anemia Patient's hemoglobin is 5.6. Will transfuse one unit and exchange transfusion 1 unit.  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 She has leukocytosis which could be secondary to vaso-occlusive crisis. She also has hypoxia. No chest pain or shortness of breath. Fever could be related to sickle cell crisis. Will get blood cultures x2 and repeat chest x-ray. Urinalysis is negative. Will empirically cover with Zosyn and Vanco.  Left lower extremity edema Doppler negative for DVT   Code Status: Full Code Family Communication:  Disposition Plan: Home when stable  Consultants:  None  Procedures:  None  Antibiotics:  Vanc 3/14  Zosyn 3/14    HPI/Subjective: Patient complains of pain in the joints, hip and both legs.  No Chest pain or SOB.  She states that whenever she gets a crisis episode she normally breathes shallow and end up with low oxygen levels. She states that she does not have a true allergy to Tylenol she was told not to take too many. She states that when she takes Tylenol, she does not get hives or any reaction.  Objective: Filed Vitals:   12/04/12 2128 12/05/12 0230 12/05/12 0525 12/05/12 0600  BP: 113/67 114/67 112/60   Pulse: 119 120 102   Temp: 98.9 F (37.2 C) 99.8 F (37.7 C) 102.6 F (39.2 C) 102 F (38.9 C)  TempSrc: Oral Oral Oral Oral  Resp: 18 18 16    Height:      Weight:      SpO2: 93% 92% 98%     Intake/Output Summary (Last 24 hours) at  12/05/12 1122 Last data filed at 12/05/12 0700  Gross per 24 hour  Intake   2015 ml  Output   1500 ml  Net    515 ml   Filed Weights   12/04/12 1609  Weight: 69.854 kg (154 lb)    Exam:   General:  Patient is lying in bed. She is in some distress secondary to severe pain  Cardiovascular: Slightly tachycardic but no murmurs rubs or gallops  Respiratory: Clear to auscultation bilaterally no wheezes rhonchi or rales  Abdomen: Soft nontender nondistended positive bowel sounds  Musculoskeletal: Tender in the thigh area.  Extremities slight swelling in the left lower extremity  Data Reviewed: Basic Metabolic Panel:  Recent Labs Lab 12/03/12 0424 12/04/12 1615  NA 136 137  K 3.4* 4.3  CL 103 104  CO2 23 26  GLUCOSE 102* 92  BUN 5* 12  CREATININE 0.54 0.67  CALCIUM 8.7 8.6   Liver Function Tests:  Recent Labs Lab 12/04/12 1615  AST 82*  ALT 34  ALKPHOS 142*  BILITOT 9.8*  PROT 6.8  ALBUMIN 2.8*   CBC:  Recent Labs Lab 12/03/12 0424 12/04/12 1130 12/05/12 0525  WBC 11.5* 10.6* 14.0*  NEUTROABS 6.4 6.7 8.8*  HGB 8.5* 7.4* 5.8*  HCT 23.0* 19.2* 15.1*  MCV 104.5* 99.0 97.4  PLT 249 207 163   BNP (last 3 results)  Recent Labs  03/25/12 0847  PROBNP 255.2*  Studies: Dg Chest 2 View  12/04/2012  *RADIOLOGY REPORT*  Clinical Data: Sickle cell, chest pain.  CHEST - 2 VIEW  Comparison: 07/06/2012.  Findings: Trachea is midline.  Heart size normal.  Lungs are clear. No pleural fluid.  IMPRESSION: No acute findings.   Original Report Authenticated By: Leanna Battles, M.D.     Scheduled Meds: . deferoxamine (DESFERAL) IV  2,000 mg Intravenous QHS  . enoxaparin (LOVENOX) injection  40 mg Subcutaneous Q24H  . folic acid  1 mg Oral Daily  .  HYDROmorphone (DILAUDID) injection  4 mg Intravenous Q2H  . ketorolac  30 mg Intravenous Q6H  . loratadine  10 mg Oral Daily  . OxyCODONE  10 mg Oral Q12H  . potassium chloride SA  40 mEq Oral Daily  . senna  1  tablet Oral BID  . sodium chloride  10-40 mL Intracatheter Q12H   Continuous Infusions: . dextrose 5 % and 0.45% NaCl 50 mL/hr at 12/04/12 1900      Time spent: 30 min   DAVIS, Dawn [name]  Sickle Cell Pager [phone]. If 7PM-7AM, please contact night-coverage at [email or web address], password  12/05/2012, 11:22 AM  LOS: 2 days

## 2012-12-05 NOTE — Progress Notes (Signed)
VASCULAR LAB PRELIMINARY  PRELIMINARY  PRELIMINARY  PRELIMINARY  Left lower extremity venous duplex  completed.    Preliminary report:  Left:  No evidence of DVT, superficial thrombosis, or Baker's cyst.   CESTONE, HELENE, RVT 12/05/2012, 2:21 PM

## 2012-12-05 NOTE — Care Management (Signed)
CARE MANAGEMENT NOTE 12/05/2012  Patient:  Dawn [name],Dawn [name]   Account Number:  [id number]  Date Initiated:  12/05/2012  Documentation initiated by:  DAVIS,TYMEEKA  Subjective/Objective Assessment:   32 yo female admitted with sickle cell crisis. PTA pt discharged from [facility].     Action/Plan:   Home when stable   Anticipated DC Date:     Anticipated DC Plan:           Choice offered to / List presented to:             Status of service:   Medicare Important Message given?   (If response is "NO", the following Medicare IM given date fields will be blank) Date Medicare IM given:   Date Additional Medicare IM given:    Discharge Disposition:    Per UR Regulation:  Reviewed for med. necessity/level of care/duration of stay  If discussed at Long Length of Stay Meetings, dates discussed:    Comments:  12/05/12 1438 Tymeeka Davis,RN,BSN

## 2012-12-05 NOTE — Progress Notes (Signed)
ANTIBIOTIC CONSULT NOTE - INITIAL  Pharmacy Consult for Vanco and Zosyn Indication: Empiric (High Fevers, Hypoxia)  Allergies  Allergen Reactions  . Desferal (Deferoxamine) Hives    Local reaction on arm only during infusion. Can take with benadryl   . Latex Other (See Comments)    REACTION: Pt experiences a burning sensation on contacted skin areas  . Lisinopril Other (See Comments) and Cough    REACTION: Sore/scratchy throat  . Tape Other (See Comments)    REACTION: Pt. Experiences a burning sensation on contacted skin areas  . Tylenol (Acetaminophen) Other (See Comments)    Pt. Does not take the following due to protection of kidney health. Past occurrence of Protein in urine.    Patient Measurements: Height: 5\' 2"  (157.5 cm) Weight: 154 lb (69.854 kg) IBW/kg (Calculated) : 50.1  Vital Signs: Temp: 102 F (38.9 C) (03/14 0600) Temp src: Oral (03/14 0600) BP: 112/60 mmHg (03/14 0525) Pulse Rate: 102 (03/14 0525)  Labs:  Recent Labs  12/03/12 0424 12/04/12 1130 12/04/12 1615 12/05/12 0525  WBC 11.5* 10.6*  --  14.0*  HGB 8.5* 7.4*  --  5.8*  PLT 249 207  --  163  CREATININE 0.54  --  0.67  --    Estimated Creatinine Clearance: 93.3 ml/min (by C-G formula based on Cr of 0.67).   Microbiology: 3/14 Blood x2: sent  Medical History: Past Medical History  Diagnosis Date  . Sickle cell disease   . Sickle cell disease, type S   . Blood transfusion   . 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 Transferred to [facility] on 3/13, now to begin Vanco and Zosyn for high fevers and hypoxia. Wt= 70kg, CrCl > 90 ml/min.  Goal of Therapy:  Vancomycin trough level 15-20 mcg/ml  Plan:  1) Vanco 750mg  IV q8h 2) Zosyn 3.375g IV q8h (4hr infusion) 3) Follow up blood cultures  Darrol Angel,  PharmD Pager: [phone] 12/05/2012,11:19 AM

## 2012-12-05 NOTE — Progress Notes (Signed)
CRITICAL VALUE ALERT  Critical value received:  Hbg 5.8, notified of temp of 102.2 as well  Date of notification:  12/05/12  Time of notification:  0553  Critical value read back: yes  Nurse who received alert:  [place]  MD notified (1st page):  Merdis Delay NP  Time of first page:  0559  MD notified (2nd page):  Time of second page:  Responding MD:  Merdis Delay NP  Time MD responded:  1610, new orders

## 2012-12-05 NOTE — Progress Notes (Signed)
Pharmacy: Hydrea Monitoring  Hydrea has been held secondary to the following lab parameters not met today.   Hydroxyurea (Hydrea) hold criteria  ANC < 2  Pltc < 80K in sickle-cell patients; < 100K in other patients  Hgb <= 6 in sickle-cell patients; < 8 in other patients  Reticulocytes < 80K when Hgb < 9   *MD will need to resume Hydrea once lab parameters are met.  Pharmacy will f/u  Clydene Fake PharmD Pager #: [phone] 8:18 AM 12/05/2012

## 2012-12-06 LAB — CBC
Hemoglobin: 7 g/dL — ABNORMAL LOW (ref 12.0–15.0)
Platelets: 172 10*3/uL (ref 150–400)
RBC: 1.92 MIL/uL — ABNORMAL LOW (ref 3.87–5.11)
WBC: 9.4 10*3/uL (ref 4.0–10.5)

## 2012-12-06 LAB — COMPREHENSIVE METABOLIC PANEL
ALT: 27 U/L (ref 0–35)
AST: 72 U/L — ABNORMAL HIGH (ref 0–37)
Alkaline Phosphatase: 112 U/L (ref 39–117)
CO2: 25 mEq/L (ref 19–32)
Chloride: 105 mEq/L (ref 96–112)
GFR calc non Af Amer: >90 mL/min (ref >90)
Glucose, Bld: 100 mg/dL — ABNORMAL HIGH (ref 70–99)
Potassium: 3.5 mEq/L (ref 3.5–5.1)
Sodium: 137 mEq/L (ref 135–145)
Total Bilirubin: 7.4 mg/dL — ABNORMAL HIGH (ref 0.3–1.2)

## 2012-12-06 MED ORDER — HYDROXYUREA 500 MG PO CAPS
500.0000 mg | ORAL_CAPSULE | ORAL | Status: DC
  Administered 2012-12-06 – 2012-12-10 (×3): 500 mg via ORAL
  Filled 2012-12-06 (×3): qty 1

## 2012-12-06 MED ORDER — HYDROXYUREA 500 MG PO CAPS: 500.0000 mg | ORAL_CAPSULE | ORAL | Status: DC

## 2012-12-06 MED ORDER — HYDROXYUREA 500 MG PO CAPS
1000.0000 mg | ORAL_CAPSULE | ORAL | Status: DC
  Administered 2012-12-07 – 2012-12-11 (×3): 1000 mg via ORAL
  Filled 2012-12-06 (×4): qty 2

## 2012-12-06 MED ORDER — VITAMINS A & D EX OINT
TOPICAL_OINTMENT | CUTANEOUS | Status: AC
  Administered 2012-12-06: 17:00:00
  Filled 2012-12-06: qty 5

## 2012-12-06 NOTE — Progress Notes (Signed)
[facility] PROGRESS NOTE  Dawn [name] ZOX:[id number] DOB: Dec 20, 1980 DOA: 12/03/2012 PCP: August Saucer ERIC, MD  Assessment/Plan: Sickle Cell Pain with Crisis- Patient with sickle cell crisis with active hemolysis. Continue the Dilaudid 4 mg IV every 2 hours, IV Toradol 30 mg daily every 6 hours secondary to the Inflammatory process  component with sickle cell crisis, OxyContin 10 mg every 12 hours. Resume hydroxyurea hemoglobin has improved.  Bilirubin has improved. Leukocytosis has resolved  Anemia Patient's hemoglobin is 7.0 posttransfusion. Will hold the second unit for now and recheck in the morning.  Secondary Hemochromatosis- continue Desferal   Fever  Chest x-ray shows no infiltrate, UA is negative, blood culture so far is negative. If no infection found. Then will DC tomorrow.  Left lower extremity edema Doppler negative for DVT   Code Status: Full Code Family Communication:  Disposition Plan: Home when stable  Consultants:  None  Procedures:  None  Antibiotics:  Vanc 3/14  Zosyn 3/14    HPI/Subjective: Patient has no complaint of chest pain or shortness of breath. She hurts in her hips and her lower back. She states that that's the usual location for sickle cell crisis.  Objective: Filed Vitals:   12/04/12 2128 12/05/12 0230 12/05/12 0525 12/05/12 0600  BP: 113/67 114/67 112/60   Pulse: 119 120 102   Temp: 98.9 F (37.2 C) 99.8 F (37.7 C) 102.6 F (39.2 C) 102 F (38.9 C)  TempSrc: Oral Oral Oral Oral  Resp: 18 18 16    Height:      Weight:      SpO2: 93% 92% 98%     Intake/Output Summary (Last 24 hours) at 12/05/12 1122 Last data filed at 12/05/12 0700  Gross per 24 hour  Intake   2015 ml  Output   1500 ml  Net    515 ml   Filed Weights   12/04/12 1609  Weight: 69.854 kg (154 lb)    Exam:   General:  Patient is lying in bed. She is in no acute distress  Cardiovascular: Slightly tachycardic but no murmurs rubs or  gallops  Respiratory: Clear to auscultation bilaterally no wheezes rhonchi or rales  Abdomen: Soft nontender nondistended positive bowel sounds  Musculoskeletal: Tender in the thigh area.  Extremities slight swelling in the left lower extremity  Data Reviewed: Basic Metabolic Panel:  Recent Labs Lab 12/03/12 0424 12/04/12 1615  NA 136 137  K 3.4* 4.3  CL 103 104  CO2 23 26  GLUCOSE 102* 92  BUN 5* 12  CREATININE 0.54 0.67  CALCIUM 8.7 8.6   Liver Function Tests:  Recent Labs Lab 12/04/12 1615  AST 82*  ALT 34  ALKPHOS 142*  BILITOT 9.8*  PROT 6.8  ALBUMIN 2.8*   CBC:  Recent Labs Lab 12/03/12 0424 12/04/12 1130 12/05/12 0525  WBC 11.5* 10.6* 14.0*  NEUTROABS 6.4 6.7 8.8*  HGB 8.5* 7.4* 5.8*  HCT 23.0* 19.2* 15.1*  MCV 104.5* 99.0 97.4  PLT 249 207 163   BNP (last 3 results)  Recent Labs  03/25/12 0847  PROBNP 255.2*   Studies: Dg Chest 2 View  12/04/2012  *RADIOLOGY REPORT*  Clinical Data: Sickle cell, chest pain.  CHEST - 2 VIEW  Comparison: 07/06/2012.  Findings: Trachea is midline.  Heart size normal.  Lungs are clear. No pleural fluid.  IMPRESSION: No acute findings.   Original Report Authenticated By: Leanna Battles, M.D.     Scheduled Meds: . deferoxamine (DESFERAL) IV  2,000 mg Intravenous  QHS  . enoxaparin (LOVENOX) injection  40 mg Subcutaneous Q24H  . folic acid  1 mg Oral Daily  .  HYDROmorphone (DILAUDID) injection  4 mg Intravenous Q2H  . ketorolac  30 mg Intravenous Q6H  . loratadine  10 mg Oral Daily  . OxyCODONE  10 mg Oral Q12H  . potassium chloride SA  40 mEq Oral Daily  . senna  1 tablet Oral BID  . sodium chloride  10-40 mL Intracatheter Q12H   Continuous Infusions: . dextrose 5 % and 0.45% NaCl 50 mL/hr at 12/04/12 1900      Time spent: 30 min   Clova Morlock JARRETT  Sickle Cell Pager [phone]. If 7PM-7AM, please contact night-coverage at [email or web address], password  12/05/2012, 11:22 AM  LOS: 2 days

## 2012-12-07 LAB — CBC
MCH: 36.7 pg — ABNORMAL HIGH (ref 26.0–34.0)
MCHC: 37.4 g/dL — ABNORMAL HIGH (ref 30.0–36.0)
MCV: 98.3 fL (ref 78.0–100.0)
Platelets: 153 10*3/uL (ref 150–400)
RBC: 1.77 MIL/uL — ABNORMAL LOW (ref 3.87–5.11)
RDW: 25.6 % — ABNORMAL HIGH (ref 11.5–15.5)

## 2012-12-07 NOTE — Progress Notes (Signed)
CRITICAL VALUE ALERT  Critical value received:  HGB 6.5  Date of notification:  12/07/12  Time of notification: 0710  Critical value read back: yes  Nurse who received alert:  [place], RN  MD notified (1st page):  Dr. Earlene Plater  Time of first page:  0714  MD notified (2nd page):  Time of second page:  Responding MD:    Time MD responded:

## 2012-12-07 NOTE — Progress Notes (Signed)
[facility] PROGRESS NOTE  Dawn [name]:[id number] DOB: 1980-10-28 DOA: 12/03/2012 PCP: August Saucer ERIC, MD  Assessment/Plan: Sickle Cell Pain with Crisis- Patient with sickle cell crisis with active hemolysis. Continue the Dilaudid 4 mg IV every 2 hours, IV Toradol 30 mg daily every 6 hours secondary to the Inflammatory process  component with sickle cell crisis, OxyContin 10 mg every 12 hours.Hold  hydroxyurea hemoglobin less than 7.0.  Bilirubin has improved. Leukocytosis has resolved.  Patient states that her pain is 7/10. She has noted improvement. She states that her Dilaudid is lasting for this 2 hours. Will continue another 24 hours of scheduled medications. Blood cultures are negative, urine culture negative and chest x-ray does not show any infiltrate. Will discontinue vancomycin and Zosyn.  Plan is to start tapering her medications tomorrow.  Anemia Patient's hemoglobin is 6.5 posttransfusion 1 unit. Will transfuse a second unit.   Secondary Hemochromatosis- continue Desferal   Fever  Chest x-ray shows no infiltrate, UA is negative, blood culture so far is negative. Fever could be secondary to the hemolysis. Discontinue IV Zosyn and vancomycin.  Left lower extremity edema Doppler negative for DVT   Code Status: Full Code Family Communication:  Disposition Plan: Home when stable  Consultants:  None  Procedures:  None  Antibiotics:  Vanc 3/14-3/16  Zosyn 3/14-3/16    HPI/Subjective: Patient has no complaint of chest pain or shortness of breath. She hurts in her hips and her lower back. She states that that's the usual location for sickle cell crisis.  Pain today is 7/10.  Objective: Filed Vitals:   12/04/12 2128 12/05/12 0230 12/05/12 0525 12/05/12 0600  BP: 113/67 114/67 112/60   Pulse: 119 120 102   Temp: 98.9 F (37.2 C) 99.8 F (37.7 C) 102.6 F (39.2 C) 102 F (38.9 C)  TempSrc: Oral Oral Oral Oral  Resp: 18 18 16    Height:      Weight:       SpO2: 93% 92% 98%     Intake/Output Summary (Last 24 hours) at 12/05/12 1122 Last data filed at 12/05/12 0700  Gross per 24 hour  Intake   2015 ml  Output   1500 ml  Net    515 ml   Filed Weights   12/04/12 1609  Weight: 69.854 kg (154 lb)    Exam:   General:  Patient is sitting up in bed. She is in no acute distress  Cardiovascular: Slightly tachycardic but no murmurs rubs or gallops  Respiratory: Clear to auscultation bilaterally no wheezes rhonchi or rales  Abdomen: Soft nontender nondistended positive bowel sounds  Musculoskeletal: Tender in the thigh area.  Extremities slight swelling in the left lower extremity  Data Reviewed: Basic Metabolic Panel:  Recent Labs Lab 12/03/12 0424 12/04/12 1615  NA 136 137  K 3.4* 4.3  CL 103 104  CO2 23 26  GLUCOSE 102* 92  BUN 5* 12  CREATININE 0.54 0.67  CALCIUM 8.7 8.6   Liver Function Tests:  Recent Labs Lab 12/04/12 1615  AST 82*  ALT 34  ALKPHOS 142*  BILITOT 9.8*  PROT 6.8  ALBUMIN 2.8*   CBC:  Recent Labs Lab 12/03/12 0424 12/04/12 1130 12/05/12 0525  WBC 11.5* 10.6* 14.0*  NEUTROABS 6.4 6.7 8.8*  HGB 8.5* 7.4* 5.8*  HCT 23.0* 19.2* 15.1*  MCV 104.5* 99.0 97.4  PLT 249 207 163   BNP (last 3 results)  Recent Labs  03/25/12 0847  PROBNP 255.2*   Studies: Dg  Chest 2 View  12/04/2012  *RADIOLOGY REPORT*  Clinical Data: Sickle cell, chest pain.  CHEST - 2 VIEW  Comparison: 07/06/2012.  Findings: Trachea is midline.  Heart size normal.  Lungs are clear. No pleural fluid.  IMPRESSION: No acute findings.   Original Report Authenticated By: Leanna Battles, M.D.     Scheduled Meds: . deferoxamine (DESFERAL) IV  2,000 mg Intravenous QHS  . enoxaparin (LOVENOX) injection  40 mg Subcutaneous Q24H  . folic acid  1 mg Oral Daily  .  HYDROmorphone (DILAUDID) injection  4 mg Intravenous Q2H  . ketorolac  30 mg Intravenous Q6H  . loratadine  10 mg Oral Daily  . OxyCODONE  10 mg Oral Q12H  .  potassium chloride SA  40 mEq Oral Daily  . senna  1 tablet Oral BID  . sodium chloride  10-40 mL Intracatheter Q12H   Continuous Infusions: . dextrose 5 % and 0.45% NaCl 50 mL/hr at 12/04/12 1900      Time spent: 30 min   [name] JARRETT  Sickle Cell Pager [phone]. If 7PM-7AM, please contact night-coverage at [email or web address], password  12/05/2012, 11:22 AM  LOS: 2 days

## 2012-12-08 LAB — TYPE AND SCREEN
ABO/RH(D): A POS
Antibody Screen: POSITIVE
DAT, IgG: NEGATIVE
Unit division: 0
Unit division: 0

## 2012-12-08 LAB — CBC
HCT: 21.2 % — ABNORMAL LOW (ref 36.0–46.0)
MCHC: 36.8 g/dL — ABNORMAL HIGH (ref 30.0–36.0)
Platelets: 153 10*3/uL (ref 150–400)
RDW: 25.6 % — ABNORMAL HIGH (ref 11.5–15.5)

## 2012-12-08 MED ORDER — ALPRAZOLAM 0.5 MG PO TABS
0.5000 mg | ORAL_TABLET | Freq: Once | ORAL | Status: AC
  Administered 2012-12-08: 0.5 mg via ORAL
  Filled 2012-12-08: qty 1

## 2012-12-08 MED ORDER — HYDROMORPHONE HCL PF 2 MG/ML IJ SOLN
4.0000 mg | INTRAMUSCULAR | Status: DC | PRN
  Administered 2012-12-08 – 2012-12-11 (×23): 4 mg via INTRAVENOUS
  Filled 2012-12-08: qty 4
  Filled 2012-12-08 (×23): qty 2

## 2012-12-08 MED ORDER — HYDROMORPHONE HCL PF 4 MG/ML IJ SOLN
4.0000 mg | INTRAMUSCULAR | Status: DC
  Administered 2012-12-08 (×2): 4 mg via INTRAVENOUS
  Filled 2012-12-08 (×2): qty 1

## 2012-12-08 NOTE — Progress Notes (Signed)
[facility] PROGRESS NOTE  Dawn [name] AVW:[id number] DOB: 1980-10-08 DOA: 12/03/2012 PCP: August Saucer ERIC, MD  Assessment/Plan: Sickle Cell Pain with Crisis- Patient with sickle cell crisis;  active hemolysis seemed to have resolved. Will check bilirubin level tomorrow.  Start tapering premedications. Today her pain never a 6-7/10. Decrease Dilaudid 4 mg IV every 2 hours scheduled, change to when necessary, continue IV Toradol 30 mg daily every 6 hours secondary to the Inflammatory process  component with sickle cell crisis, OxyContin 10 mg every 12 hours.continue  Hydroxyurea.   Bilirubin improved yesterday. She has noted improvement. She states that her Dilaudid is lasting for this 2 hours.  Blood cultures are negative, urine culture negative and chest x-ray does not show any infiltrate. Discontinued  vancomycin and Zosyn yesterday.   Anemia Patient's hemoglobin is 7.8  posttransfusion 2 unit.    Secondary Hemochromatosis- continue Desferal   Fever  Chest x-ray shows no infiltrate, UA is negative, blood culture so far is negative. Fever could be secondary to the hemolysis. Discontinue IV Zosyn and vancomycin yesterday.  Afebrile x24 hours since antibiotic has been discontinued.  Left lower extremity edema Doppler negative for DVT   Code Status: Full Code Family Communication:  Disposition Plan: Home when stable  Consultants:  None  Procedures:  None  Antibiotics:  Vanc 3/14-3/16  Zosyn 3/14-3/16    HPI/Subjective: Patient has no complaint of chest pain or shortness of breath. She hurts in her hips and her lower back. She states that that's the usual location for sickle cell crisis.  Pain today is 6-7/10.  She complains of a dry cough today.  Objective: Filed Vitals:   12/04/12 2128 12/05/12 0230 12/05/12 0525 12/05/12 0600  BP: 113/67 114/67 112/60   Pulse: 119 120 102   Temp: 98.9 F (37.2 C) 99.8 F (37.7 C) 102.6 F (39.2 C) 102 F (38.9 C)  TempSrc: Oral  Oral Oral Oral  Resp: 18 18 16    Height:      Weight:      SpO2: 93% 92% 98%     Intake/Output Summary (Last 24 hours) at 12/05/12 1122 Last data filed at 12/05/12 0700  Gross per 24 hour  Intake   2015 ml  Output   1500 ml  Net    515 ml   Filed Weights   12/04/12 1609  Weight: 69.854 kg (154 lb)    Exam:   General:  Patient is sitting up in bed. She is in no acute distress  Cardiovascular: Regular rate and rhythm but no murmurs rubs or gallops  Respiratory: Clear to auscultation bilaterally no wheezes rhonchi or rales  Abdomen: Soft nontender nondistended positive bowel sounds  Musculoskeletal: Tender in the thigh area.  Extremities slight swelling in the left lower extremity stable  Data Reviewed: Basic Metabolic Panel:  Recent Labs Lab 12/03/12 0424 12/04/12 1615  NA 136 137  K 3.4* 4.3  CL 103 104  CO2 23 26  GLUCOSE 102* 92  BUN 5* 12  CREATININE 0.54 0.67  CALCIUM 8.7 8.6   Liver Function Tests:  Recent Labs Lab 12/04/12 1615  AST 82*  ALT 34  ALKPHOS 142*  BILITOT 9.8*  PROT 6.8  ALBUMIN 2.8*   CBC:  Recent Labs Lab 12/03/12 0424 12/04/12 1130 12/05/12 0525  WBC 11.5* 10.6* 14.0*  NEUTROABS 6.4 6.7 8.8*  HGB 8.5* 7.4* 5.8*  HCT 23.0* 19.2* 15.1*  MCV 104.5* 99.0 97.4  PLT 249 207 163   BNP (last 3  results)  Recent Labs  03/25/12 0847  PROBNP 255.2*   Studies: Dg Chest 2 View  12/04/2012  *RADIOLOGY REPORT*  Clinical Data: Sickle cell, chest pain.  CHEST - 2 VIEW  Comparison: 07/06/2012.  Findings: Trachea is midline.  Heart size normal.  Lungs are clear. No pleural fluid.  IMPRESSION: No acute findings.   Original Report Authenticated By: Leanna Battles, M.D.     Scheduled Meds: . deferoxamine (DESFERAL) IV  2,000 mg Intravenous QHS  . enoxaparin (LOVENOX) injection  40 mg Subcutaneous Q24H  . folic acid  1 mg Oral Daily  .  HYDROmorphone (DILAUDID) injection  4 mg Intravenous Q2H  . ketorolac  30 mg Intravenous Q6H   . loratadine  10 mg Oral Daily  . OxyCODONE  10 mg Oral Q12H  . potassium chloride SA  40 mEq Oral Daily  . senna  1 tablet Oral BID  . sodium chloride  10-40 mL Intracatheter Q12H   Continuous Infusions: . dextrose 5 % and 0.45% NaCl 50 mL/hr at 12/04/12 1900      Time spent: 30 min   Chantele Corado JARRETT  Sickle Cell Pager [phone]. If 7PM-7AM, please contact night-coverage at [email or web address], password  12/05/2012, 11:22 AM  LOS: 2 days

## 2012-12-08 NOTE — Care Management Note (Signed)
CM: Patient has no concerns or needs today. She is able to get medications after discharge. Advised her to contact CM if her needs change.

## 2012-12-09 LAB — COMPREHENSIVE METABOLIC PANEL
ALT: 22 U/L (ref 0–35)
AST: 50 U/L — ABNORMAL HIGH (ref 0–37)
CO2: 28 mEq/L (ref 19–32)
Calcium: 8.1 mg/dL — ABNORMAL LOW (ref 8.4–10.5)
Sodium: 136 mEq/L (ref 135–145)
Total Protein: 6.2 g/dL (ref 6.0–8.3)

## 2012-12-09 LAB — CBC
MCH: 35.7 pg — ABNORMAL HIGH (ref 26.0–34.0)
Platelets: 178 10*3/uL (ref 150–400)
RBC: 2.27 MIL/uL — ABNORMAL LOW (ref 3.87–5.11)

## 2012-12-09 MED ORDER — OXYCODONE HCL 5 MG PO TABS
10.0000 mg | ORAL_TABLET | ORAL | Status: DC
  Administered 2012-12-09 – 2012-12-11 (×12): 10 mg via ORAL
  Filled 2012-12-09 (×12): qty 2

## 2012-12-09 MED ORDER — ALPRAZOLAM 0.25 MG PO TABS: 0.2500 mg | ORAL_TABLET | Freq: Three times a day (TID) | ORAL | Status: DC | PRN

## 2012-12-09 MED ORDER — POTASSIUM CHLORIDE CRYS ER 20 MEQ PO TBCR
40.0000 meq | EXTENDED_RELEASE_TABLET | ORAL | Status: AC
  Administered 2012-12-09 (×2): 40 meq via ORAL
  Filled 2012-12-09 (×2): qty 2

## 2012-12-09 NOTE — Progress Notes (Signed)
SICKLE CELL SERVICE PROGRESS NOTE  Dawn [name] ZOX:[id number] DOB: 01-06-1981 DOA: 12/03/2012 PCP: August Saucer ERIC, MD  Assessment/Plan: Active Problems:   Sickle cell crisis: Pt states that her pain escalated last night when she felt that she was having an anxiety attack. She has required IV analgesics about every 2 hours and states that her pain is down to 6/10. I will add her short acting oral analgesic and transition off IV Dilaudid.   Anxiety: Pt states that she has periodic episodes of anxiety and takes Xanax as needed but has not required it in some time. Will order Xanax 0.25 mg TID PRN anxiety    Sickle cell anemia: Hb stable at 8.1 post transfusion.    Fever: Resolved. Likely associated with acute VOC.    Hypokalemia: Potassium mildly decreased. Will replace orally.    Transfusion associated hemochromatosis: continue Desferal.  Code Status: Full Code Family Communication: N/A Disposition Plan: Home in 24-48 hours.  [name],[name]  Pager [phone]. If 7PM-7AM, please contact night-coverage.  12/09/2012, 4:26 PM  LOS: 6 days   Brief narrative: [name] is a 32 yr old [name] with SS Sickle Cell Disease who was discharged from [facility] hospital at 0700 this AM. States at time of d/c her pain was 10/10. She went home took her pain medication as prescribed with no relief as her pain was escalating. She than proceeded to contacted the [facility] . Her complaint is Gradual onset for 24 hrs constant spasm pain to her arms, hips, legs, and lower back . She was originally ex[periencing mild pain than went on a filed trip with her son and the pain became worst. She currently denies fever, chills, headache, dizziness, chest pain, SOB, palpitations, nausea, vomiting, diarrhea, abdominal pain, dysuria, hematuria, urinary urgency or frequency, and constipation with last BM yesterday morning. Pain continues to be 10/10 delay in treatment due to unable to obtain IV access.       Consultants:  None  Procedures:  None  Antibiotics:  Vancomycin 3/14 >> 3/16  Zosyn 3/13 >>3/16  HPI/Subjective: Pt states that her pain is 6/10 after medications. Localized to back and legs and aching in nature. No associated symptoms.  Objective: Filed Vitals:   12/09/12 0553 12/09/12 0652 12/09/12 1044 12/09/12 1449  BP: 110/58  106/52 123/70  Pulse: 100  96 98  Temp: 98.8 F (37.1 C)  99 F (37.2 C) 99 F (37.2 C)  TempSrc: Oral  Oral Oral  Resp: 18  18 18   Height:      Weight:  75.297 kg (166 lb)    SpO2: 98%  96% 96%   Weight change: -0.272 kg (-9.6 oz)  Intake/Output Summary (Last 24 hours) at 12/09/12 1626 Last data filed at 12/09/12 0536  Gross per 24 hour  Intake   2238 ml  Output      0 ml  Net   2238 ml    General: Alert, awake, oriented x3, in no acute distress.  HEENT: /AT PEERL, EOMI, mild Icterus Heart: Regular rate and rhythm, without murmurs, rubs, gallops.  Lungs: Clear to auscultation, no wheezing or rhonchi noted.  Abdomen: Soft, nontender, nondistended, positive bowel sounds, no masses no hepatosplenomegaly noted.  Neuro: No focal neurological deficits noted cranial nerves II through XII grossly intact. Strength normal in bilateral upper and lower extremities. Musculoskeletal: No warm swelling or erythema around joints, no spinal tenderness noted. Psychiatric: Patient alert and oriented x3 and with normal affect.   Data Reviewed:  Basic Metabolic Panel:  Recent Labs Lab 12/03/12 0424 12/04/12 1615 12/06/12 0500 12/09/12 0530 12/09/12 0550  NA 136 137 137 136  --   K 3.4* 4.3 3.5 3.3*  --   CL 103 104 105 102  --   CO2 23 26 25 28   --   GLUCOSE 102* 92 100* 115*  --   BUN 5* 12 8 <3*  --   CREATININE 0.54 0.67 0.55 0.55  --   CALCIUM 8.7 8.6 8.0* 8.1*  --   MG  --   --   --   --  1.8   Liver Function Tests:  Recent Labs Lab 12/04/12 1615 12/06/12 0500 12/09/12 0530  AST 82* 72* 50*  ALT 34 27 22  ALKPHOS  142* 112 135*  BILITOT 9.8* 7.4* 4.0*  PROT 6.8 5.9* 6.2  ALBUMIN 2.8* 2.5* 2.5*   No results found for this basename: LIPASE, AMYLASE,  in the last 168 hours No results found for this basename: AMMONIA,  in the last 168 hours CBC:  Recent Labs Lab 12/03/12 0424 12/04/12 1130 12/05/12 0525 12/06/12 0500 12/07/12 0545 12/08/12 0355 12/09/12 0530  WBC 11.5* 10.6* 14.0* 9.4 7.8 7.2 7.1  NEUTROABS 6.4 6.7 8.8*  --   --   --   --   HGB 8.5* 7.4* 5.8* 7.0* 6.5* 7.8* 8.1*  HCT 23.0* 19.2* 15.1* 18.4* 17.4* 21.2* 22.2*  MCV 104.5* 99.0 97.4 95.8 98.3 97.2 97.8  PLT 249 207 163 172 153 153 178   Cardiac Enzymes: No results found for this basename: CKTOTAL, CKMB, CKMBINDEX, TROPONINI,  in the last 168 hours BNP (last 3 results)  Recent Labs  03/25/12 0847  PROBNP 255.2*   CBG: No results found for this basename: GLUCAP,  in the last 168 hours  Recent Results (from the past 240 hour(s))  CULTURE, BLOOD (ROUTINE X 2)     Status: None   Collection Time    12/05/12  6:55 AM      Result Value Range Status   Specimen Description BLOOD RIGHT ANTECUBITAL   Final   Special Requests BOTTLES DRAWN AEROBIC AND ANAEROBIC 5CC   Final   Culture  Setup Time 12/05/2012 08:40   Final   Culture     Final   Value:        BLOOD CULTURE RECEIVED NO GROWTH TO DATE CULTURE WILL BE HELD FOR 5 DAYS BEFORE ISSUING A FINAL NEGATIVE REPORT   Report Status PENDING   Incomplete  CULTURE, BLOOD (ROUTINE X 2)     Status: None   Collection Time    12/05/12  7:00 AM      Result Value Range Status   Specimen Description BLOOD RIGHT HAND   Final   Special Requests BOTTLES DRAWN AEROBIC AND ANAEROBIC [facility]   Final   Culture  Setup Time 12/05/2012 08:40   Final   Culture     Final   Value:        BLOOD CULTURE RECEIVED NO GROWTH TO DATE CULTURE WILL BE HELD FOR 5 DAYS BEFORE ISSUING A FINAL NEGATIVE REPORT   Report Status PENDING   Incomplete     Studies: Dg Chest 2 View  12/05/2012  *RADIOLOGY REPORT*   Clinical Data: Sickle cell crisis.  Shortness of breath.  Body aches.  CHEST - 2 VIEW  Comparison: 12/04/2012  Findings: Left PICC has been inserted and the tip is in the superior vena cava in good position.  Heart size is  normal.  Slight pulmonary vascular prominence, increased.  Tiny area of atelectasis at the left lung base.  Lungs are otherwise clear.  IMPRESSION:  1.  PICC in good position. 2.  Increased pulmonary vascular prominence.   Original Report Authenticated By: Francene Boyers, M.D.    Dg Chest 2 View  12/04/2012  *RADIOLOGY REPORT*  Clinical Data: Sickle cell, chest pain.  CHEST - 2 VIEW  Comparison: 07/06/2012.  Findings: Trachea is midline.  Heart size normal.  Lungs are clear. No pleural fluid.  IMPRESSION: No acute findings.   Original Report Authenticated By: Leanna Battles, M.D.     Scheduled Meds: . deferoxamine (DESFERAL) IV  2,000 mg Intravenous QHS  . enoxaparin (LOVENOX) injection  40 mg Subcutaneous Q24H  . folic acid  1 mg Oral Daily  . hydroxyurea  1,000 mg Oral Q48H  . hydroxyurea  500 mg Oral Q48H  . loratadine  10 mg Oral Daily  . oxyCODONE  10 mg Oral Q4H  . OxyCODONE  10 mg Oral Q12H  . senna  1 tablet Oral BID  . sodium chloride  10-40 mL Intracatheter Q12H   Continuous Infusions: . dextrose 5 % and 0.45% NaCl 75 mL/hr at 12/08/12 2156

## 2012-12-10 LAB — COMPREHENSIVE METABOLIC PANEL
Alkaline Phosphatase: 141 U/L — ABNORMAL HIGH (ref 39–117)
BUN: 4 mg/dL — ABNORMAL LOW (ref 6–23)
Chloride: 101 mEq/L (ref 96–112)
GFR calc Af Amer: >90 mL/min (ref >90)
Glucose, Bld: 106 mg/dL — ABNORMAL HIGH (ref 70–99)
Potassium: 3.5 mEq/L (ref 3.5–5.1)
Total Bilirubin: 3.5 mg/dL — ABNORMAL HIGH (ref 0.3–1.2)

## 2012-12-10 NOTE — Progress Notes (Signed)
Subjective: A 32 yo admitted with sickle cell pain crisis. Patient having 6/10 pain despite addition of IV Dilaudid yesterday. No fever today, no shortness of breath, no nausea, vomiting or Diarrhea.   Objective: Vital signs in last 24 hours: Temp:  [98.3 F (36.8 C)-99.8 F (37.7 C)] 98.9 F (37.2 C) (03/19 0949) Pulse Rate:  [95-103] 100 (03/19 0949) Resp:  [16-18] 16 (03/19 0630) BP: (121-134)/(63-84) 121/78 mmHg (03/19 0949) SpO2:  [94 %-96 %] 94 % (03/19 0949) Weight change:  Last BM Date: 12/08/12  Intake/Output from previous day: 03/18 0701 - 03/19 0700 In: 2570 [P.O.:240; I.V.:1830; IV Piggyback:500] Out: -  Intake/Output this shift: Total I/O In: 240 [P.O.:240] Out: -   General appearance: alert, cooperative, icteric and no distress Eyes: conjunctivae/corneas clear. PERRL, EOM's intact. Fundi benign. Resp: clear to auscultation bilaterally Cardio: regular rate and rhythm, S1, S2 normal, no murmur, click, rub or gallop GI: soft, non-tender; bowel sounds normal; no masses,  no organomegaly Extremities: extremities normal, atraumatic, no cyanosis or edema Neurologic: Grossly normal  Lab Results:  Recent Labs  12/08/12 0355 12/09/12 0530  WBC 7.2 7.1  HGB 7.8* 8.1*  HCT 21.2* 22.2*  PLT 153 178   BMET  Recent Labs  12/09/12 0530 12/10/12 0550  NA 136 135  K 3.3* 3.5  CL 102 101  CO2 28 26  GLUCOSE 115* 106*  BUN <3* 4*  CREATININE 0.55 0.56  CALCIUM 8.1* 8.5    Studies/Results: No results found.  Medications: I have reviewed the patient's current medications.  Assessment/Plan: A 32 yo patient admitted with Sickle cell pain crisis with slow resolution so far.  #1  Sickle Cell pain Crisis: Patient on both oral and IV pain medications. Her pain is still not controlled. Has not received IV toradol for more than 24 hours. Will make sure she gets it and then initiate titration off Dilaudid.  #2 Febrile illness: seems to have resolved.  #3  Hypokalemia: Repleted. Potassium is 3.5 today.  #4 Anemia: No evidence of Hemolytic crisis. Continue to monitor her H/H  #5 Disposition: Home with oral medication hopefully next 48 hours.  LOS: 7 days   Tan Clopper,LAWAL 12/10/2012, 12:17 PM

## 2012-12-11 LAB — CULTURE, BLOOD (ROUTINE X 2)
Culture: NO GROWTH
Culture: NO GROWTH

## 2012-12-11 LAB — CBC WITH DIFFERENTIAL/PLATELET
Basophils Absolute: 0.1 10*3/uL (ref 0.0–0.1)
Lymphocytes Relative: 12 % (ref 12–46)
Lymphs Abs: 0.8 10*3/uL (ref 0.7–4.0)
Monocytes Relative: 19 % — ABNORMAL HIGH (ref 3–12)
Platelets: 187 10*3/uL (ref 150–400)
RDW: 24.9 % — ABNORMAL HIGH (ref 11.5–15.5)
WBC: 6.5 10*3/uL (ref 4.0–10.5)

## 2012-12-11 LAB — COMPREHENSIVE METABOLIC PANEL
AST: 50 U/L — ABNORMAL HIGH (ref 0–37)
Albumin: 2.6 g/dL — ABNORMAL LOW (ref 3.5–5.2)
Chloride: 99 mEq/L (ref 96–112)
Creatinine, Ser: 0.55 mg/dL (ref 0.50–1.10)
Potassium: 3.5 mEq/L (ref 3.5–5.1)
Total Bilirubin: 4 mg/dL — ABNORMAL HIGH (ref 0.3–1.2)

## 2012-12-11 MED ORDER — OXYCODONE HCL 10 MG PO TABS: 10.0000 mg | ORAL_TABLET | ORAL | Status: DC | PRN

## 2012-12-11 MED ORDER — OXYCODONE HCL ER 10 MG PO T12A: 10.0000 mg | EXTENDED_RELEASE_TABLET | Freq: Two times a day (BID) | ORAL | Status: DC

## 2012-12-11 MED ORDER — HYDROMORPHONE HCL PF 2 MG/ML IJ SOLN
3.0000 mg | INTRAMUSCULAR | Status: DC | PRN
  Administered 2012-12-11 (×2): 3 mg via INTRAVENOUS
  Filled 2012-12-11 (×2): qty 2

## 2012-12-11 MED ORDER — OXYCODONE HCL 5 MG PO TABS
15.0000 mg | ORAL_TABLET | ORAL | Status: DC
  Administered 2012-12-11: 15 mg via ORAL
  Filled 2012-12-11: qty 3

## 2012-12-11 MED ORDER — POLYETHYLENE GLYCOL 3350 17 G PO PACK: 17.0000 g | PACK | Freq: Every day | ORAL | Status: DC

## 2012-12-11 MED ORDER — VITAMIN D (ERGOCALCIFEROL) 1.25 MG (50000 UNIT) PO CAPS: 50000.0000 [IU] | ORAL_CAPSULE | ORAL | Status: DC

## 2012-12-11 MED ORDER — POLYETHYLENE GLYCOL 3350 17 G PO PACK
17.0000 g | PACK | Freq: Every day | ORAL | Status: DC
  Filled 2012-12-11: qty 1

## 2012-12-11 NOTE — Discharge Summary (Signed)
Dawn [name] MRN: [id number] DOB/AGE: 1980-11-03 32 y.o.  Admit date: 12/03/2012 Discharge date: 12/11/2012  Primary Care Physician:  Willey Blade, MD   Discharge Diagnoses:   Patient Active Problem List  Diagnosis  . Sickle cell crisis  . Sickle cell disease  . Elevated LFTs  . Sickle cell anemia  . Fever  . Leukocytosis  . HTN (hypertension)  . Hypokalemia  . Hypoalbuminemia  . Bronchitis  . Oral candidiasis  . Reactive depression (situational)  . Transfusion associated hemochromatosis    DISCHARGE MEDICATION:   Medication List    TAKE these medications       cetirizine 10 MG tablet  Commonly known as:  ZYRTEC  Take 1 tablet (10 mg total) by mouth daily as needed. For allergies     folic acid 1 MG tablet  Commonly known as:  FOLVITE  Take 1 mg by mouth daily.     hydroxyurea 500 MG capsule  Commonly known as:  HYDREA  Take 500-1,000 mg by mouth as directed. Take 500mg  (1 tablet) ON ODD DAYS OF THE MONTH AND 1000 MG (2) TABLETS ON THE EVEN DAYS.     Oxycodone HCl 10 MG Tabs  Take 1-1.5 tablets (10-15 mg total) by mouth every 4 (four) hours as needed.     OxyCODONE 10 mg T12a  Commonly known as:  OXYCONTIN  Take 1 tablet (10 mg total) by mouth every 12 (twelve) hours.     polyethylene glycol packet  Commonly known as:  MIRALAX / GLYCOLAX  Take 17 g by mouth daily.     potassium chloride SA 20 MEQ tablet  Commonly known as:  K-DUR,KLOR-CON  Take 2 tablets (40 mEq total) by mouth daily.     promethazine 12.5 MG tablet  Commonly known as:  PHENERGAN  Take 1 tablet (12.5 mg total) by mouth every 6 (six) hours as needed for nausea. 1-2 prn for nausea begin with 1 if no relief may repeat x's 1     Vitamin D (Ergocalciferol) 50000 UNITS Caps  Commonly known as:  DRISDOL  Take 1 capsule (50,000 Units total) by mouth every 7 (seven) days.          Consults:     SIGNIFICANT DIAGNOSTIC STUDIES:  Dg Chest 2 View  12/05/2012  *RADIOLOGY REPORT*  Clinical  Data: Sickle cell crisis.  Shortness of breath.  Body aches.  CHEST - 2 VIEW  Comparison: 12/04/2012  Findings: Left PICC has been inserted and the tip is in the superior vena cava in good position.  Heart size is normal.  Slight pulmonary vascular prominence, increased.  Tiny area of atelectasis at the left lung base.  Lungs are otherwise clear.  IMPRESSION:  1.  PICC in good position. 2.  Increased pulmonary vascular prominence.   Original Report Authenticated By: Francene Boyers, M.D.    Dg Chest 2 View  12/04/2012  *RADIOLOGY REPORT*  Clinical Data: Sickle cell, chest pain.  CHEST - 2 VIEW  Comparison: 07/06/2012.  Findings: Trachea is midline.  Heart size normal.  Lungs are clear. No pleural fluid.  IMPRESSION: No acute findings.   Original Report Authenticated By: Leanna Battles, M.D.        A  Recent Results (from the past 240 hour(s))  CULTURE, BLOOD (ROUTINE X 2)     Status: None   Collection Time    12/05/12  6:55 AM      Result Value Range Status   Specimen Description BLOOD RIGHT ANTECUBITAL  Final   Special Requests BOTTLES DRAWN AEROBIC AND ANAEROBIC 5CC   Final   Culture  Setup Time 12/05/2012 08:40   Final   Culture NO GROWTH 5 DAYS   Final   Report Status 12/11/2012 FINAL   Final  CULTURE, BLOOD (ROUTINE X 2)     Status: None   Collection Time    12/05/12  7:00 AM      Result Value Range Status   Specimen Description BLOOD RIGHT HAND   Final   Special Requests BOTTLES DRAWN AEROBIC AND ANAEROBIC [facility]   Final   Culture  Setup Time 12/05/2012 08:40   Final   Culture NO GROWTH 5 DAYS   Final   Report Status 12/11/2012 FINAL   Final    BRIEF ADMITTING H & P: [name] is a 32 yr old [name] with SS Sickle Cell Disease who was discharged from [facility] hospital at 0700 this AM. States at time of d/c her pain was 10/10. She went home took her pain medication as prescribed with no relief as her pain was escalating. She than proceeded to contacted the [facility] .  Her complaint is Gradual onset for 24 hrs constant spasm pain to her arms, hips, legs, and lower back . She was originally ex[periencing mild pain than went on a filed trip with her son and the pain became worst. She currently denies fever, chills, headache, dizziness, chest pain, SOB, palpitations, nausea, vomiting, diarrhea, abdominal pain, dysuria, hematuria, urinary urgency or frequency, and constipation with last BM yesterday morning. Pain continues to be 10/10 delay in treatment due to unable to obtain IV access.     Hospital Course:  Present on Admission:  . Sickle cell crisis: Pt was admitted with acute vaso-occlusive episode which was made worse with abrupt change in barometric pressure.  She had hemolysis which required transfusion of 2 units of PRBC's. She was treated with bolus doses of Dilaudid,Toradol and Oxycontin. PCA was initiated however was ineffective as the flow kept cutting off because of high HR and decreased respiratory rate. She was resumed on the bolus doses of analgesics. At the time of discharge, pt still has complaints of pain in her legs. I have discussed LTAC as an option for further care as she has some gait abnormality. The patient has refused to be discharged to any other venue other than home. She has also refused PT as adjunctive care to her pain. I have had a long discussion with patient and the plan is to discharge home on increased dose of oxycodone (15 mg), and taper to off as pain improves. She has been dispensed Oxycontin 10 mg #60, and Oxycodone IR 5 mg #60.  Marland Kitchen Sickle cell anemia: Pt has a baseline Hgb of 8.0. She required transfusion of 2 units of PRBC secondary to hemolysis. A review of her electrophoresis from 06/2012 shows Hb F at 9.0%. Pt has been on the same dose of Hydrea for 2 years. She states that attempts were made to increase to 1000 mg daily but her blood counts decreased requiring her to be resumed on her previous dose. I repeated her hemoglobin  electrophoresis today. I will follow up on it and discuss it at her next office visit.  . Fever: Pt had a Tmax of 102 on 3/14. She was evaluated for infectious causes and was empirically started on antibiotics (vancomycin and zosyn). All of her work-up was negative for infection and antibiotics were discontinued after 48 hours. She  has had no further fevers and all cultures are negative to date. Pt's fever was likely associated with her vaso-occlusive episode.  . Transfusion associated hemochromatosis: pt has a ferritin level of 1612 in October 2013. Not currently on Exjade.  . Hypokalemia: Replaced orally. Normal at the time of discharge.  Disposition and Follow-up:  Pt is being discharged in stable condition. She is to follow up with me in the office in 2 weeks.    DISCHARGE EXAM:  General: Alert, awake, oriented x3, in no acute distress.  Vital Signs: BP 111/61, a, T 98.9 F (37.2 C), temperature source Oral, RR 17, height 5\' 2"  (1.575 m), weight 75.751 kg (167 lb), SpO2 93.00%. HA EENT: [place]/AT PEERL, EOMI, mild Icterus (baseline) Heart: Regular rate and rhythm, without murmurs, rubs, gallops.  Lungs: Clear to auscultation, no wheezing or rhonchi noted.  Abdomen: Soft, nontender, nondistended, positive bowel sounds, no masses no hepatosplenomegaly noted.  Neuro: No focal neurological deficits noted cranial nerves II through XII grossly intact. Strength normal in bilateral upper and lower extremities.  Musculoskeletal: No warm swelling or erythema around joints, no spinal tenderness noted.  Psychiatric: Patient alert and oriented x3.    Recent Labs  12/09/12 0550 12/10/12 0550 12/11/12 0855  NA  --  135 133*  K  --  3.5 3.5  CL  --  101 99  CO2  --  26 27  GLUCOSE  --  106* 100*  BUN  --  4* 3*  CREATININE  --  0.56 0.55  CALCIUM  --  8.5 8.6  MG 1.8  --  1.8    Recent Labs  12/10/12 0550 12/11/12 0855  AST 48* 50*  ALT 22 23  ALKPHOS 141* 146*  BILITOT 3.5* 4.0*   PROT 6.3 7.1  ALBUMIN 2.5* 2.6*   No results found for this basename: LIPASE, AMYLASE,  in the last 72 hours  Recent Labs  12/09/12 0530 12/11/12 0855  WBC 7.1 6.5  NEUTROABS  --  4.1  HGB 8.1* 8.4*  HCT 22.2* 23.1*  MCV 97.8 98.7  PLT 178 187   Total time for discharge process including decision-making face-to-face time greater than 30 minutes   Signed: Chinelo Benn A. 12/11/2012, 12:43 PM

## 2012-12-11 NOTE — Progress Notes (Signed)
Patient discharged to home via wheelchair with family, discharge instructions reviewed with patient who verbalized understanding. New RX's given to patient. Patient instructed to call MD if continues to have pain that is not relieved with her medication.

## 2012-12-11 NOTE — Progress Notes (Signed)
Per MD order, PICC line removed. Cath intact at 37cm. Tip of cath clean cut with no signs of breakage. Vaseline pressure gauze to site, pressure held x . No bleeding to site. Pt instructed to keep dressing CDI x 24 hours. Avoid heavy lifting, pushing or pulling x 24 hours,  If bleeding occurs hold pressure, if bleeding does not stop contact MD or go to the ED. Pt asked to stay in bed x after removal of Picc line. Pt does not have any questions. Dawn [name]

## 2012-12-15 DIAGNOSIS — J020 Streptococcal pharyngitis: Secondary | ICD-10-CM | POA: Diagnosis not present

## 2012-12-15 LAB — HEMOGLOBINOPATHY EVALUATION
Hemoglobin Other: 0 %
Hgb A2 Quant: 2.9 % (ref 2.2–3.2)
Hgb A: 25.9 % — ABNORMAL LOW (ref 96.8–97.8)
Hgb S Quant: 63.3 % — ABNORMAL HIGH

## 2012-12-24 NOTE — H&P (Signed)
This is a patient With Hb SS disease who is only infrequently hospitalized. She initially was seen in the ED at [facility] and then discharged from the ED. She presented to the [facility] for treatment and intially had difficulty with IV access. She was treated with IM Dilaudid for her 1st dose of analgesia. Once IV access is obtained then transition to IV dilaudid as planned. Discussed with NP Gwinda Passe. Agree with assessment and plan.

## 2013-01-01 NOTE — Progress Notes (Signed)
Pt seen and examined and discussed with NP Michelle Edwards. Agree with assessment and plan. 

## 2013-01-30 ENCOUNTER — Encounter: Payer: Self-pay | Admitting: Internal Medicine

## 2013-02-05 NOTE — Telephone Encounter (Signed)
See above note

## 2013-02-10 ENCOUNTER — Telehealth: Payer: Self-pay | Admitting: Internal Medicine

## 2013-02-11 MED ORDER — HYDROXYUREA 500 MG PO CAPS
500.0000 mg | ORAL_CAPSULE | ORAL | Status: DC
Start: 2013-02-11 — End: 2013-03-04

## 2013-02-11 NOTE — Telephone Encounter (Signed)
Refill on Hydro urea reviewed labs. Needs appt to est care for further refills

## 2013-02-16 ENCOUNTER — Emergency Department (HOSPITAL_COMMUNITY): Payer: Medicare Other

## 2013-02-16 ENCOUNTER — Encounter (HOSPITAL_COMMUNITY): Payer: Self-pay

## 2013-02-16 ENCOUNTER — Inpatient Hospital Stay (HOSPITAL_COMMUNITY)
Admission: EM | Admit: 2013-02-16 | Discharge: 2013-02-24 | DRG: 811 | Disposition: A | Discharge status: Home / Self Care | Payer: Medicare Other | Attending: Internal Medicine | Admitting: Internal Medicine

## 2013-02-16 DIAGNOSIS — R509 Fever, unspecified: Secondary | ICD-10-CM

## 2013-02-16 DIAGNOSIS — Z9089 Acquired absence of other organs: Secondary | ICD-10-CM | POA: Diagnosis not present

## 2013-02-16 DIAGNOSIS — D571 Sickle-cell disease without crisis: Secondary | ICD-10-CM | POA: Diagnosis not present

## 2013-02-16 DIAGNOSIS — J40 Bronchitis, not specified as acute or chronic: Secondary | ICD-10-CM

## 2013-02-16 DIAGNOSIS — M25559 Pain in unspecified hip: Secondary | ICD-10-CM | POA: Diagnosis not present

## 2013-02-16 DIAGNOSIS — J9819 Other pulmonary collapse: Secondary | ICD-10-CM | POA: Diagnosis not present

## 2013-02-16 DIAGNOSIS — I10 Essential (primary) hypertension: Secondary | ICD-10-CM | POA: Diagnosis not present

## 2013-02-16 DIAGNOSIS — F341 Dysthymic disorder: Secondary | ICD-10-CM | POA: Diagnosis present

## 2013-02-16 DIAGNOSIS — R7989 Other specified abnormal findings of blood chemistry: Secondary | ICD-10-CM

## 2013-02-16 DIAGNOSIS — Z79899 Other long term (current) drug therapy: Secondary | ICD-10-CM | POA: Diagnosis not present

## 2013-02-16 DIAGNOSIS — R0989 Other specified symptoms and signs involving the circulatory and respiratory systems: Secondary | ICD-10-CM | POA: Diagnosis not present

## 2013-02-16 DIAGNOSIS — E8809 Other disorders of plasma-protein metabolism, not elsewhere classified: Secondary | ICD-10-CM

## 2013-02-16 DIAGNOSIS — D65 Disseminated intravascular coagulation [defibrination syndrome]: Secondary | ICD-10-CM | POA: Diagnosis present

## 2013-02-16 DIAGNOSIS — I809 Phlebitis and thrombophlebitis of unspecified site: Secondary | ICD-10-CM | POA: Diagnosis present

## 2013-02-16 DIAGNOSIS — R0902 Hypoxemia: Secondary | ICD-10-CM | POA: Diagnosis not present

## 2013-02-16 DIAGNOSIS — R040 Epistaxis: Secondary | ICD-10-CM | POA: Diagnosis not present

## 2013-02-16 DIAGNOSIS — B370 Candidal stomatitis: Secondary | ICD-10-CM

## 2013-02-16 DIAGNOSIS — R0602 Shortness of breath: Secondary | ICD-10-CM | POA: Diagnosis not present

## 2013-02-16 DIAGNOSIS — E876 Hypokalemia: Secondary | ICD-10-CM

## 2013-02-16 DIAGNOSIS — I808 Phlebitis and thrombophlebitis of other sites: Secondary | ICD-10-CM | POA: Diagnosis not present

## 2013-02-16 DIAGNOSIS — D72829 Elevated white blood cell count, unspecified: Secondary | ICD-10-CM

## 2013-02-16 DIAGNOSIS — J90 Pleural effusion, not elsewhere classified: Secondary | ICD-10-CM | POA: Diagnosis not present

## 2013-02-16 DIAGNOSIS — M549 Dorsalgia, unspecified: Secondary | ICD-10-CM | POA: Diagnosis not present

## 2013-02-16 DIAGNOSIS — D5700 Hb-SS disease with crisis, unspecified: Principal | ICD-10-CM

## 2013-02-16 DIAGNOSIS — F329 Major depressive disorder, single episode, unspecified: Secondary | ICD-10-CM

## 2013-02-16 DIAGNOSIS — R079 Chest pain, unspecified: Secondary | ICD-10-CM | POA: Diagnosis not present

## 2013-02-16 LAB — LACTIC ACID, PLASMA: Lactic Acid, Venous: 0.9 mmol/L (ref 0.5–2.2)

## 2013-02-16 LAB — COMPREHENSIVE METABOLIC PANEL
ALT: 37 U/L — ABNORMAL HIGH (ref 0–35)
AST: 127 U/L — ABNORMAL HIGH (ref 0–37)
CO2: 27 mEq/L (ref 19–32)
Calcium: 9.3 mg/dL (ref 8.4–10.5)
Chloride: 99 mEq/L (ref 96–112)
GFR calc non Af Amer: >90 mL/min (ref >90)
Potassium: 4 mEq/L (ref 3.5–5.1)
Sodium: 135 mEq/L (ref 135–145)
Total Bilirubin: 8 mg/dL — ABNORMAL HIGH (ref 0.3–1.2)

## 2013-02-16 LAB — URINALYSIS, ROUTINE W REFLEX MICROSCOPIC
Glucose, UA: NEGATIVE mg/dL
Ketones, ur: NEGATIVE mg/dL
Leukocytes, UA: NEGATIVE
Nitrite: NEGATIVE
Protein, ur: 30 mg/dL — AB
pH: 6 (ref 5.0–8.0)

## 2013-02-16 LAB — CBC WITH DIFFERENTIAL/PLATELET
Blasts: 0 %
MCH: 34.9 pg — ABNORMAL HIGH (ref 26.0–34.0)
MCHC: 36.4 g/dL — ABNORMAL HIGH (ref 30.0–36.0)
Myelocytes: 0 %
Neutro Abs: 13.3 10*3/uL — ABNORMAL HIGH (ref 1.7–7.7)
Neutrophils Relative %: 77 % (ref 43–77)
Platelets: 205 10*3/uL (ref 150–400)
Promyelocytes Absolute: 0 %
nRBC: 0 /100 WBC

## 2013-02-16 LAB — RETICULOCYTES: Retic Count, Absolute: 925.9 10*3/uL — ABNORMAL HIGH (ref 19.0–186.0)

## 2013-02-16 LAB — URINE MICROSCOPIC-ADD ON

## 2013-02-16 MED ORDER — ONDANSETRON HCL 4 MG/2ML IJ SOLN
4.0000 mg | Freq: Once | INTRAMUSCULAR | Status: AC
  Administered 2013-02-16: 4 mg via INTRAVENOUS
  Filled 2013-02-16: qty 2

## 2013-02-16 MED ORDER — HYDROMORPHONE HCL PF 2 MG/ML IJ SOLN
4.0000 mg | INTRAMUSCULAR | Status: DC | PRN
  Administered 2013-02-16: 4 mg via INTRAVENOUS
  Filled 2013-02-16: qty 2

## 2013-02-16 MED ORDER — ONDANSETRON HCL 4 MG/2ML IJ SOLN
4.0000 mg | Freq: Four times a day (QID) | INTRAMUSCULAR | Status: DC | PRN
  Administered 2013-02-16 – 2013-02-19 (×4): 4 mg via INTRAVENOUS
  Filled 2013-02-16 (×4): qty 2

## 2013-02-16 MED ORDER — ACETAMINOPHEN 325 MG PO TABS
650.0000 mg | ORAL_TABLET | Freq: Four times a day (QID) | ORAL | Status: DC | PRN
  Administered 2013-02-16 – 2013-02-17 (×2): 650 mg via ORAL
  Filled 2013-02-16 (×2): qty 2

## 2013-02-16 MED ORDER — HYDROMORPHONE 0.3 MG/ML IV SOLN
INTRAVENOUS | Status: DC
  Administered 2013-02-16: 1 mg via INTRAVENOUS
  Administered 2013-02-16: 6.99 mg via INTRAVENOUS
  Administered 2013-02-16: 1 mg via INTRAVENOUS
  Filled 2013-02-16 (×2): qty 25

## 2013-02-16 MED ORDER — HYDROMORPHONE HCL PF 1 MG/ML IJ SOLN
4.0000 mg | INTRAMUSCULAR | Status: DC | PRN
  Administered 2013-02-16: 4 mg via INTRAVENOUS
  Filled 2013-02-16: qty 4

## 2013-02-16 MED ORDER — NALOXONE HCL 0.4 MG/ML IJ SOLN: 0.4000 mg | INTRAMUSCULAR | Status: DC | PRN

## 2013-02-16 MED ORDER — HYDROXYUREA 500 MG PO CAPS: 500.0000 mg | ORAL_CAPSULE | ORAL | Status: DC

## 2013-02-16 MED ORDER — ONDANSETRON HCL 4 MG PO TABS: 4.0000 mg | ORAL_TABLET | Freq: Four times a day (QID) | ORAL | Status: DC | PRN

## 2013-02-16 MED ORDER — OXYCODONE HCL ER 10 MG PO T12A
10.0000 mg | EXTENDED_RELEASE_TABLET | Freq: Two times a day (BID) | ORAL | Status: DC
  Administered 2013-02-16 – 2013-02-23 (×15): 10 mg via ORAL
  Filled 2013-02-16 (×16): qty 1

## 2013-02-16 MED ORDER — KETOROLAC TROMETHAMINE 30 MG/ML IJ SOLN
30.0000 mg | Freq: Four times a day (QID) | INTRAMUSCULAR | Status: AC
  Administered 2013-02-16 – 2013-02-21 (×19): 30 mg via INTRAVENOUS
  Filled 2013-02-16 (×20): qty 1

## 2013-02-16 MED ORDER — POLYETHYLENE GLYCOL 3350 17 G PO PACK: 17.0000 g | PACK | Freq: Every day | ORAL | Status: DC | PRN

## 2013-02-16 MED ORDER — HYDROMORPHONE HCL PF 1 MG/ML IJ SOLN
1.0000 mg | Freq: Once | INTRAMUSCULAR | Status: AC
Start: 2013-02-16 — End: 2013-02-16
  Administered 2013-02-16: 1 mg via INTRAVENOUS
  Filled 2013-02-16: qty 1

## 2013-02-16 MED ORDER — FOLIC ACID 1 MG PO TABS
1.0000 mg | ORAL_TABLET | Freq: Every day | ORAL | Status: DC
  Administered 2013-02-16 – 2013-02-23 (×8): 1 mg via ORAL
  Filled 2013-02-16 (×8): qty 1

## 2013-02-16 MED ORDER — PROMETHAZINE HCL 12.5 MG PO TABS: 12.5000 mg | ORAL_TABLET | Freq: Four times a day (QID) | ORAL | Status: DC | PRN

## 2013-02-16 MED ORDER — HEPARIN SODIUM (PORCINE) 5000 UNIT/ML IJ SOLN
5000.0000 [IU] | Freq: Three times a day (TID) | INTRAMUSCULAR | Status: DC
  Administered 2013-02-16 – 2013-02-18 (×8): 5000 [IU] via SUBCUTANEOUS
  Filled 2013-02-16 (×8): qty 1

## 2013-02-16 MED ORDER — KETOROLAC TROMETHAMINE 30 MG/ML IJ SOLN
30.0000 mg | Freq: Once | INTRAMUSCULAR | Status: AC
  Administered 2013-02-16: 30 mg via INTRAVENOUS
  Filled 2013-02-16: qty 1

## 2013-02-16 MED ORDER — HYDROXYUREA 500 MG PO CAPS: 1000.0000 mg | ORAL_CAPSULE | ORAL | Status: DC

## 2013-02-16 MED ORDER — OXYCODONE HCL 5 MG PO TABS
10.0000 mg | ORAL_TABLET | ORAL | Status: DC | PRN
  Administered 2013-02-16: 10 mg via ORAL
  Filled 2013-02-16: qty 2

## 2013-02-16 MED ORDER — LORATADINE 10 MG PO TABS
10.0000 mg | ORAL_TABLET | Freq: Every day | ORAL | Status: DC
  Administered 2013-02-16 – 2013-02-23 (×8): 10 mg via ORAL
  Filled 2013-02-16 (×8): qty 1

## 2013-02-16 MED ORDER — HYDROMORPHONE HCL PF 1 MG/ML IJ SOLN
1.0000 mg | Freq: Once | INTRAMUSCULAR | Status: AC
  Administered 2013-02-16: 1 mg via INTRAVENOUS
  Filled 2013-02-16: qty 1

## 2013-02-16 MED ORDER — SODIUM CHLORIDE 0.9 % IJ SOLN: 9.0000 mL | INTRAMUSCULAR | Status: DC | PRN

## 2013-02-16 MED ORDER — SODIUM CHLORIDE 0.9 % IV SOLN
INTRAVENOUS | Status: DC
  Administered 2013-02-16 – 2013-02-19 (×7): via INTRAVENOUS

## 2013-02-16 MED ORDER — HYDROMORPHONE HCL PF 2 MG/ML IJ SOLN: 4.0000 mg | INTRAMUSCULAR | Status: DC | PRN

## 2013-02-16 MED ORDER — VITAMIN D (ERGOCALCIFEROL) 1.25 MG (50000 UNIT) PO CAPS
50000.0000 [IU] | ORAL_CAPSULE | ORAL | Status: DC
  Administered 2013-02-22: 50000 [IU] via ORAL
  Filled 2013-02-16: qty 1

## 2013-02-16 MED ORDER — PANTOPRAZOLE SODIUM 40 MG IV SOLR
40.0000 mg | INTRAVENOUS | Status: DC
  Administered 2013-02-16 – 2013-02-17 (×2): 40 mg via INTRAVENOUS
  Filled 2013-02-16 (×2): qty 40

## 2013-02-16 MED ORDER — HYDROMORPHONE HCL PF 2 MG/ML IJ SOLN
2.0000 mg | Freq: Once | INTRAMUSCULAR | Status: AC
  Administered 2013-02-16: 2 mg via INTRAVENOUS
  Filled 2013-02-16: qty 1

## 2013-02-16 NOTE — ED Notes (Signed)
Pt c/o pain in lower back and arms/legs bilaterally. Pt states this is consistent with previous sickle cell crisis pain. Symptoms began last night. Pt states regular homes meds are not helping.

## 2013-02-16 NOTE — ED Notes (Signed)
Pt reports severe pain to her legs, arms, and back since last night around 8:30.  Pt reports hx of sickle cell.  Pt tearful in triage.

## 2013-02-16 NOTE — Progress Notes (Signed)
Patient came in to hospital during the day and had been getting dilaudid 4mg  IV, along with other PO pain medicines and was not getting any relief. Patient was in tears. Doctor was notified and new orders were given for a PCA pump along with cardiac monitoring.  Will continue to monitor patient.

## 2013-02-16 NOTE — Progress Notes (Signed)
Patient was still complaining of pain. Patient's heart rate was staying up in the 130's and fever was staying up. Doctor was made aware and new orders were given. Will continue to monitor patient.

## 2013-02-16 NOTE — ED Notes (Signed)
Notified of pts continued pain. EDP notified, new orders received. Pt also given a hot pack per request.

## 2013-02-16 NOTE — H&P (Signed)
[facility] History and Physical  Mayley Lish Newingham ZOX:[id number] DOB: 1981-08-28 DOA: 02/16/2013  Referring physician: Dr Gwenevere Abbot PCP: August Saucer ERIC, MD    Chief Complaint: Pain in her arms, legs, chest and back.  HPI: Dawn [name] is a 32 y.o. female who has sickle cell disease and presents with pain in arms, legs, chest and back since yesterday evening. She has been taking her pain medicines as required and found that she required escalating doses of oxycodone which did not relieve her pain. She cannot find a triggering factor to cause her to have a sickle cell crisis but she  appears to have one now . She denies any hemoptysis, excessive dyspnea. She is usually compliant with treatment and has not had excessive number of admissions.   Review of Systems:.  Apart from history of present illness, other systems negative.  Past Medical History  Diagnosis Date  . Sickle cell disease   . Sickle cell disease, type S   . Blood transfusion   . 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 Cholecystectomy    .  left knee acl reconstruction    . Cesarean section      x 2  . Portacath placement     Social History:  reports that she has never smoked. She does not have any smokeless tobacco history on file. She reports that she does not drink alcohol or use illicit drugs.    Allergies  Allergen Reactions  . Desferal (Deferoxamine) Hives    Local reaction on arm only during infusion. Can take with benadryl   . Latex Other (See Comments)    REACTION: Pt experiences a burning sensation on contacted skin areas  . Lisinopril Other (See Comments) and Cough    REACTION: Sore/scratchy throat  . Tape Other (See Comments)    REACTION: Pt. Experiences a burning sensation on contacted skin areas    No family history on file. noncontributory.   Prior to Admission medications   Medication Sig Start Date End Date Taking? Authorizing Provider   cetirizine (ZYRTEC) 10 MG tablet Take 1 tablet (10 mg total) by mouth daily as needed. For allergies 07/21/12  Yes Lizbeth Bark, FNP  folic acid (FOLVITE) 1 MG tablet Take 1 mg by mouth daily.     Yes Historical Provider, MD  hydroxyurea (HYDREA) 500 MG capsule Take 1-2 capsules (500-1,000 mg total) by mouth as directed. Take 500mg  (1 tablet) ON ODD DAYS OF THE MONTH AND 1000 MG (2) TABLETS ON THE EVEN DAYS. 02/11/13  Yes Grayce Sessions, NP  ibuprofen (ADVIL,MOTRIN) 200 MG tablet Take 800 mg by mouth every 6 (six) hours as needed for pain.   Yes Historical Provider, MD  OxyCODONE (OXYCONTIN) 10 mg T12A Take 1 tablet (10 mg total) by mouth every 12 (twelve) hours. 12/11/12  Yes Altha Harm, MD  Oxycodone HCl 10 MG TABS Take 1-1.5 tablets (10-15 mg total) by mouth every 4 (four) hours as needed. 12/11/12  Yes Altha Harm, MD  polyethylene glycol (MIRALAX / GLYCOLAX) packet Take 17 g by mouth daily as needed (constipation). 12/11/12  Yes Altha Harm, MD  promethazine (PHENERGAN) 12.5 MG tablet Take 1 tablet (12.5 mg total) by mouth every 6 (six) hours as needed for nausea. 1-2 prn for nausea begin with 1 if no relief may repeat x's 1 10/01/12  Yes Grayce Sessions, NP  Vitamin D, Ergocalciferol, (DRISDOL) 50000 UNITS CAPS Take 50,000  Units by mouth every 7 (seven) days. Takes on Sundays. 12/11/12  Yes Altha Harm, MD   Physical Exam: Filed Vitals:   02/16/13 1610 02/16/13 0959 02/16/13 1215  BP:  134/79 137/76  Pulse:  115 115  Temp:  99.4 F (37.4 C)   TempSrc:  Oral   Resp:  22 20  Weight: 69.854 kg (154 lb)    SpO2:  98% 94%     General:  She is in pain. She is jaundice. She does not look septic or toxic.  Eyes: Jaundice. Pallor.  ENT: No abnormalities.  Neck: No lymphadenopathy.  Cardiovascular: Sinus tachycardia. No murmurs. No clinical evidence of heart failure.  Respiratory: Lung fields are clear. There is no pleural rub. There is no bronchial  breathing. There are no crackles or wheezes.  Abdomen: Soft, nontender. No splenomegaly obvious.  Skin: No rash.  Musculoskeletal: No acute joint abnormalities.  Psychiatric: Appropriate affect. Tearful secondary to pain.  Neurologic: Alert and orientated, no focal neurological signs.  Labs on Admission:  Basic Metabolic Panel:  Recent Labs Lab 02/16/13 1022  NA 135  K 4.0  CL 99  CO2 27  GLUCOSE 100*  BUN 5*  CREATININE 0.63  CALCIUM 9.3   Liver Function Tests:  Recent Labs Lab 02/16/13 1022  AST 127*  ALT 37*  ALKPHOS 205*  BILITOT 8.0*  PROT 8.2  ALBUMIN 3.7     CBC:  Recent Labs Lab 02/16/13 1022  WBC 17.2*  NEUTROABS 13.3*  HGB 8.2*  HCT 22.5*  MCV 95.7  PLT 205   Cardiac Enzymes:  Recent Labs Lab 02/16/13 1022  TROPONINI <0.30    BNP (last 3 results)  Recent Labs  03/25/12 0847  PROBNP 255.2*      Radiological Exams on Admission: Dg Chest Port 1 View  02/16/2013   *RADIOLOGY REPORT*  Clinical Data: Short of breath  PORTABLE CHEST - 1 VIEW  Comparison: 12/05/2012  Findings: Heart size is normal.  Mediastinal shadows are normal. The vascularity is normal.  Lungs are clear.  No a few.  Previously seen PICC catheter has been removed.  IMPRESSION: No active disease.   Original Report Authenticated By: Paulina Fusi, M.D.    EKG: Independently reviewed. Sinus tachycardia, no acute ST-T wave changes.  Assessment/Plan   1. Sickle cell crisis with hemolysis. Inadequate pain control at the present time. 2. Sickle cell disease. 3. Hypertension.  Plan: 1. Admit to medical floor. 2. Intravenous fluids, intravenous analgesia and oxygen. 3. Closely monitor hemoglobin levels to see whether she requires blood transfusion. 4. Consider transfer to sickle cell unit in [facility] tomorrow depending on clinical condition.  Further recommendations will depend on patient's hospital progress.  Code Status: Full code.   Family Communication:  Discussed plan with patient at bedside.   Disposition Plan: Home when medically stable.   Time spent: 45 minutes  Wilson Singer [facility] Pager [phone]  If 7PM-7AM, please contact night-coverage [email or web address] Password [facility] 02/16/2013, 3:17 PM

## 2013-02-16 NOTE — ED Provider Notes (Signed)
History    This chart was scribed for [name], MD by [name], ED Scribe. This patient was seen in room APA16A/APA16A and the patient's care was started 10:20 AM.   CSN: [id number]  Arrival date & time 02/16/13  0930   First MD Initiated Contact with Patient 02/16/13 1013      Chief Complaint  Patient presents with  . Sickle Cell Pain Crisis    Patient is a 32 y.o. female presenting with sickle cell pain. The history is provided by the patient. No language interpreter was used.  Sickle Cell Pain Crisis Location:  Hip, chest, back and upper extremity Severity:  Severe Onset quality:  Unable to specify Duration:  1 day Similar to previous crisis episodes: yes   Timing:  Constant Progression:  Worsening Chronicity:  Recurrent Associated symptoms: no chest pain, no congestion, no cough, no fatigue and no headaches     HPI Comments: Dawn [name] is a 32 y.o. female with a h/o of sickle cell disease, type S who presents to the Emergency Department complaining of a new episode of sickle cell pain crisis starting last night. Pt reports severe pain to her legs, hips, arms, chest, and back. She reports being seen in the ED for a sickle cell crisis in the past. Her last crisis was in March 2014. She is followed by Dr [name] in [place].    Past Medical History  Diagnosis Date  . Sickle cell disease   . Sickle cell disease, type S   . Blood transfusion   . 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 Cholecystectomy    .  left knee acl reconstruction    . Cesarean section      x 2  . Portacath placement      No family history on file.  History  Substance Use Topics  . Smoking status: Never Smoker   . Smokeless tobacco: Not on file  . Alcohol Use: No    OB History   Grav Para Term Preterm Abortions TAB SAB Ect Mult Living                  Review of Systems  Constitutional: Negative for appetite  change and fatigue.  HENT: Negative for congestion, sinus pressure and ear discharge.   Eyes: Negative for discharge.  Respiratory: Negative for cough.   Cardiovascular: Negative for chest pain.  Gastrointestinal: Negative for abdominal pain and diarrhea.  Genitourinary: Negative for frequency and hematuria.  Musculoskeletal: Positive for myalgias, back pain and arthralgias.  Skin: Negative for rash.  Neurological: Negative for seizures and headaches.  Psychiatric/Behavioral: Negative for hallucinations.    Allergies  Desferal; Latex; Lisinopril; and Tape  Home Medications   Current Outpatient Rx  Name  Route  Sig  Dispense  Refill  . cetirizine (ZYRTEC) 10 MG tablet   Oral   Take 1 tablet (10 mg total) by mouth daily as needed. For allergies   30 tablet   2   . folic acid (FOLVITE) 1 MG tablet   Oral   Take 1 mg by mouth daily.           . hydroxyurea (HYDREA) 500 MG capsule   Oral   Take 1-2 capsules (500-1,000 mg total) by mouth as directed. Take 500mg  (1 tablet) ON ODD DAYS OF THE MONTH AND 1000 MG (2) TABLETS ON THE EVEN DAYS.   60 capsule   1   .  OxyCODONE (OXYCONTIN) 10 mg T12A   Oral   Take 1 tablet (10 mg total) by mouth every 12 (twelve) hours.   60 tablet   0   . Oxycodone HCl 10 MG TABS   Oral   Take 1-1.5 tablets (10-15 mg total) by mouth every 4 (four) hours as needed.   60 tablet   0   . polyethylene glycol (MIRALAX / GLYCOLAX) packet   Oral   Take 17 g by mouth daily.   14 each      . potassium chloride SA (K-DUR,KLOR-CON) 20 MEQ tablet   Oral   Take 2 tablets (40 mEq total) by mouth daily.   60 tablet   0   . promethazine (PHENERGAN) 12.5 MG tablet   Oral   Take 1 tablet (12.5 mg total) by mouth every 6 (six) hours as needed for nausea. 1-2 prn for nausea begin with 1 if no relief may repeat x's 1   30 tablet   0   . Vitamin D, Ergocalciferol, (DRISDOL) 50000 UNITS CAPS   Oral   Take 1 capsule (50,000 Units total) by mouth every 7  (seven) days.   12 capsule   0     BP 134/79  Pulse 115  Temp(Src) 99.4 F (37.4 C) (Oral)  Resp 22  Wt 154 lb (69.854 kg)  BMI 28.16 kg/m2  SpO2 98%  Physical Exam  Nursing note and vitals reviewed. Constitutional: She is oriented to person, place, and time. She appears well-developed.  HENT:  Head: Normocephalic.  Eyes: Conjunctivae and EOM are normal. No scleral icterus.  Neck: Neck supple. No thyromegaly present.  Cardiovascular: Normal rate, regular rhythm and normal heart sounds.  Exam reveals no gallop and no friction rub.   No murmur heard. Pulmonary/Chest: No stridor. She has no wheezes. She has no rales. She exhibits tenderness.  Tender over sternum.   Abdominal: She exhibits no distension. There is no tenderness. There is no rebound.  Musculoskeletal: Normal range of motion. She exhibits tenderness. She exhibits no edema.  Tender over L spine and bilateral hips.   Lymphadenopathy:    She has no cervical adenopathy.  Neurological: She is oriented to person, place, and time. Coordination normal.  Skin: No rash noted. No erythema.  Psychiatric: She has a normal mood and affect. Her behavior is normal.    ED Course  Procedures (including critical care time)  DIAGNOSTIC STUDIES: Oxygen Saturation is 98% on , normal by my interpretation.    COORDINATION OF CARE: 10:21 AM Discussed treatment plan with pt at bedside and pt agreed to plan.   Labs Reviewed  CBC WITH DIFFERENTIAL - Abnormal; Notable for the following:    WBC 17.2 (*)    RBC 2.35 (*)    Hemoglobin 8.2 (*)    HCT 22.5 (*)    MCH 34.9 (*)    MCHC 36.4 (*)    RDW 25.1 (*)    Neutro Abs 13.3 (*)    Monocytes Absolute 1.5 (*)    All other components within normal limits  COMPREHENSIVE METABOLIC PANEL - Abnormal; Notable for the following:    Glucose, Bld 100 (*)    BUN 5 (*)    AST 127 (*)    ALT 37 (*)    Alkaline Phosphatase 205 (*)    Total Bilirubin 8.0 (*)    All other components within  normal limits  RETICULOCYTES - Abnormal; Notable for the following:    Retic Ct Pct 39.4 (*)  RBC. 2.35 (*)    Retic Count, Manual 925.9 (*)    All other components within normal limits  TROPONIN I   Dg Chest Port 1 View  02/16/2013   *RADIOLOGY REPORT*  Clinical Data: Short of breath  PORTABLE CHEST - 1 VIEW  Comparison: 12/05/2012  Findings: Heart size is normal.  Mediastinal shadows are normal. The vascularity is normal.  Lungs are clear.  No a few.  Previously seen PICC catheter has been removed.  IMPRESSION: No active disease.   Original Report Authenticated By: Paulina Fusi, M.D.     No diagnosis found.    MDM       The chart was scribed for me under my direct supervision.  I personally performed the history, physical, and medical decision making and all procedures in the evaluation of this patient.[name], MD 02/16/13 [phone]

## 2013-02-17 ENCOUNTER — Inpatient Hospital Stay (HOSPITAL_COMMUNITY): Payer: Medicare Other

## 2013-02-17 DIAGNOSIS — R509 Fever, unspecified: Secondary | ICD-10-CM

## 2013-02-17 LAB — CBC WITH DIFFERENTIAL/PLATELET
Band Neutrophils: 0 % (ref 0–10)
Basophils Absolute: 0 10*3/uL (ref 0.0–0.1)
Basophils Relative: 0 % (ref 0–1)
Blasts: 0 %
Eosinophils Absolute: 0 10*3/uL (ref 0.0–0.7)
Eosinophils Relative: 0 % (ref 0–5)
HCT: 17.2 % — ABNORMAL LOW (ref 36.0–46.0)
Hemoglobin: 6.6 g/dL — CL (ref 12.0–15.0)
Lymphocytes Relative: 18 % (ref 12–46)
Lymphs Abs: 2.8 10*3/uL (ref 0.7–4.0)
MCV: 94.5 fL (ref 78.0–100.0)
Metamyelocytes Relative: 0 %
Monocytes Absolute: 0.9 10*3/uL (ref 0.1–1.0)
Monocytes Relative: 6 % (ref 3–12)
RBC: 1.82 MIL/uL — ABNORMAL LOW (ref 3.87–5.11)
WBC: 15.3 10*3/uL — ABNORMAL HIGH (ref 4.0–10.5)

## 2013-02-17 LAB — COMPREHENSIVE METABOLIC PANEL
ALT: 43 U/L — ABNORMAL HIGH (ref 0–35)
BUN: 6 mg/dL (ref 6–23)
CO2: 26 mEq/L (ref 19–32)
Calcium: 8.3 mg/dL — ABNORMAL LOW (ref 8.4–10.5)
GFR calc Af Amer: >90 mL/min (ref >90)
GFR calc non Af Amer: >90 mL/min (ref >90)
Glucose, Bld: 102 mg/dL — ABNORMAL HIGH (ref 70–99)
Sodium: 140 mEq/L (ref 135–145)

## 2013-02-17 LAB — RETICULOCYTES: Retic Ct Pct: 41 % — ABNORMAL HIGH (ref 0.4–3.1)

## 2013-02-17 LAB — FERRITIN: Ferritin: 7131 ng/mL — ABNORMAL HIGH (ref 10–291)

## 2013-02-17 MED ORDER — DEXTROSE 5 % IV SOLN
2000.0000 mg | Freq: Every day | INTRAVENOUS | Status: DC
  Administered 2013-02-17 – 2013-02-23 (×7): 2000 mg via INTRAVENOUS
  Filled 2013-02-17 (×7): qty 2

## 2013-02-17 MED ORDER — LORAZEPAM 2 MG/ML IJ SOLN
1.0000 mg | INTRAMUSCULAR | Status: DC | PRN
  Administered 2013-02-17 – 2013-02-21 (×3): 1 mg via INTRAVENOUS
  Filled 2013-02-17 (×3): qty 1

## 2013-02-17 MED ORDER — HYDROMORPHONE 0.3 MG/ML IV SOLN
INTRAVENOUS | Status: DC
  Administered 2013-02-17: 5.54 mg via INTRAVENOUS
  Administered 2013-02-17: 2.99 mg via INTRAVENOUS
  Administered 2013-02-17: 14.28 mg via INTRAVENOUS
  Administered 2013-02-17 (×2): via INTRAVENOUS
  Administered 2013-02-17: 4.99 mg via INTRAVENOUS
  Administered 2013-02-17: 19:00:00 via INTRAVENOUS
  Administered 2013-02-17: 2 mg via INTRAVENOUS
  Administered 2013-02-17: 02:00:00 via INTRAVENOUS
  Administered 2013-02-17: 2.61 mg via INTRAVENOUS
  Administered 2013-02-17: 3 mg via INTRAVENOUS
  Administered 2013-02-18: 2 mg via INTRAVENOUS
  Administered 2013-02-18: 08:00:00 via INTRAVENOUS
  Administered 2013-02-18: 2 mg via INTRAVENOUS
  Filled 2013-02-17 (×5): qty 25

## 2013-02-17 MED ORDER — CYCLOBENZAPRINE HCL 10 MG PO TABS
10.0000 mg | ORAL_TABLET | Freq: Three times a day (TID) | ORAL | Status: DC | PRN
  Administered 2013-02-17 – 2013-02-22 (×6): 10 mg via ORAL
  Filled 2013-02-17 (×7): qty 1

## 2013-02-17 NOTE — Progress Notes (Addendum)
Dawn [name] ZOX:[id number] DOB: 01-21-1981 DOA: 02/16/2013 PCP: Willey Blade, MD   Subjective: This lady was admitted with sickle cell crisis yesterday and overnight has required PCA pump for pain relief. She did not tolerate every 2 hours hydromorphone 4 mg IV. She is also on Toradol 30 mg every 6 hours IV. She says that her pain is decreased now from 10/10 to 8/10. She did have a fever and blood cultures were taken. Antibiotics were not started as she did not have any clear source of infection and she did not look septic.           Physical Exam: Blood pressure 122/71, pulse 121, temperature 98.2 F (36.8 C), temperature source Oral, resp. rate 20, height 5\' 3"  (1.6 m), weight 69.854 kg (154 lb), SpO2 96.00%. This morning she is looks uncomfortable at rest with spasms in her back and causing pain. She does not appear to have respiratory distress. Lung fields are clear. There is no pleural rub, bronchial breathing, crackles or wheezing. She is alert and oriented. Heart sounds are present and normal with a resting tachycardia. There are no murmurs. She is alert and orientated. Her peripheral perfusion is adequate.   Investigations:  Recent Results (from the past 240 hour(s))  CULTURE, BLOOD (ROUTINE X 2)     Status: None   Collection Time    02/16/13  9:58 PM      Result Value Range Status   Specimen Description BLOOD RIGHT HAND   Final   Special Requests BOTTLES DRAWN AEROBIC ONLY 4CC BOTTLE   Final   Culture PENDING   Incomplete   Report Status PENDING   Incomplete  CULTURE, BLOOD (ROUTINE X 2)     Status: None   Collection Time    02/16/13 10:03 PM      Result Value Range Status   Specimen Description BLOOD LEFT HAND   Final   Special Requests     Final   Value: BOTTLES DRAWN AEROBIC AND ANAEROBIC 6CC EACH BOTTLE   Culture PENDING   Incomplete   Report Status PENDING   Incomplete     Basic Metabolic Panel:  Recent Labs  81/19/14 1022 02/17/13 0632  NA 135 140  K 4.0  3.7  CL 99 109  CO2 27 26  GLUCOSE 100* 102*  BUN 5* 6  CREATININE 0.63 0.57  CALCIUM 9.3 8.3*   Liver Function Tests:  Recent Labs  02/16/13 1022 02/17/13 0632  AST 127* 126*  ALT 37* 43*  ALKPHOS 205* 212*  BILITOT 8.0* 9.0*  PROT 8.2 6.5  ALBUMIN 3.7 2.7*     CBC:  Recent Labs  02/16/13 1022 02/17/13 0632  WBC 17.2* 15.3*  NEUTROABS 13.3* 11.6*  HGB 8.2* 6.6*  HCT 22.5* 17.2*  MCV 95.7 94.5  PLT 205 155    Dg Chest Port 1 View  02/16/2013   *RADIOLOGY REPORT*  Clinical Data: Short of breath  PORTABLE CHEST - 1 VIEW  Comparison: 12/05/2012  Findings: Heart size is normal.  Mediastinal shadows are normal. The vascularity is normal.  Lungs are clear.  No a few.  Previously seen PICC catheter has been removed.  IMPRESSION: No active disease.   Original Report Authenticated By: Paulina Fusi, M.D.      Medications: I have reviewed the patient's current medications.  Impression: 1. Sickle cell crisis. 2. Sickle cell disease. 3. 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 Her baseline appears to be from 8-9. Her  total bilirubin is increased to 9 from 8. Reticulocyte count is pending. 4. Hypertension. 5. Fever, likely secondary to #1, no obvious source of infection.     Plan: 1. Reduce IV fluids slightly to  125 cc an hour in case some of the anemia is dilutional. Continue with hydromorphone PCA pump for the time being for analgesia. 2. Repeat chest x-ray this morning to make sure there is no developing pneumonia. Her lungs sound clear. 3. Consider blood transfusion. Hold hydroxyurea in view of decreasing hemoglobin. Will need Deforoximine  intravenously with blood. 4. I will surgery for Port-A-Cath insertion as she really has very poor IV access and, no doubt she will require intravenous access on multiple occasions in the future.  Consultants:   none.   Procedures:  None.   Antibiotics:  None.                   Code Status:  Full code.  Family Communication: Discussed plan with the patient at the bedside.   Disposition Plan: Home when medically stable.  Time spent: 20 minutes.   LOS: 1 day   Wilson Singer Pager [phone]  02/17/2013, 8:00 AM

## 2013-02-17 NOTE — Consult Note (Signed)
Reason for Consult: Chronic thrombophlebitis, sickle cell disorder, sickle cell crisis Referring Physician: [facility] hospital[facility]  Dawn [name] is an 32 y.o. female.  HPI: Patient presented to [facility] in hospital with multiple issues including diffuse pain including abdominal pain. Her workup and evaluation was consistent for sickle cell crisis. She's currently being managed as an inpatient. She does have peripheral access currently although peripheral access is limited. She's had previous Port-A-Cath placement both have been removed for infectious reasons. Last port was placed over a year ago per family member.  Past Medical History  Diagnosis Date  . Sickle cell disease   . Sickle cell disease, type S   . Blood transfusion   . 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 Cholecystectomy    .  left knee acl reconstruction    . Cesarean section      x 2  . Portacath placement      History reviewed. No pertinent family history.  Social History:  reports that she has never smoked. She does not have any smokeless tobacco history on file. She reports that she does not drink alcohol or use illicit drugs.  Allergies:  Allergies  Allergen Reactions  . Desferal (Deferoxamine) Hives    Local reaction on arm only during infusion. Can take with benadryl   . Latex Other (See Comments)    REACTION: Pt experiences a burning sensation on contacted skin areas  . Lisinopril Other (See Comments) and Cough    REACTION: Sore/scratchy throat  . Tape Other (See Comments)    REACTION: Pt. Experiences a burning sensation on contacted skin areas    Medications:  I have reviewed the patient's current medications. Prior to Admission:  Prescriptions prior to admission  Medication Sig Dispense Refill  . cetirizine (ZYRTEC) 10 MG tablet Take 1 tablet (10 mg total) by mouth daily as needed. For allergies  30 tablet  2  . folic acid (FOLVITE) 1 MG tablet  Take 1 mg by mouth daily.        . hydroxyurea (HYDREA) 500 MG capsule Take 1-2 capsules (500-1,000 mg total) by mouth as directed. Take 500mg  (1 tablet) ON ODD DAYS OF THE MONTH AND 1000 MG (2) TABLETS ON THE EVEN DAYS.  60 capsule  1  . ibuprofen (ADVIL,MOTRIN) 200 MG tablet Take 800 mg by mouth every 6 (six) hours as needed for pain.      . OxyCODONE (OXYCONTIN) 10 mg T12A Take 1 tablet (10 mg total) by mouth every 12 (twelve) hours.  60 tablet  0  . Oxycodone HCl 10 MG TABS Take 1-1.5 tablets (10-15 mg total) by mouth every 4 (four) hours as needed.  60 tablet  0  . polyethylene glycol (MIRALAX / GLYCOLAX) packet Take 17 g by mouth daily as needed (constipation).      . promethazine (PHENERGAN) 12.5 MG tablet Take 1 tablet (12.5 mg total) by mouth every 6 (six) hours as needed for nausea. 1-2 prn for nausea begin with 1 if no relief may repeat x's 1  30 tablet  0  . Vitamin D, Ergocalciferol, (DRISDOL) 50000 UNITS CAPS Take 50,000 Units by mouth every 7 (seven) days. Takes on Sundays.       Scheduled: . deferoxamine (DESFERAL) IV  2,000 mg Intravenous QHS  . folic acid  1 mg Oral Daily  . heparin  5,000 Units Subcutaneous Q8H  . HYDROmorphone PCA 0.3 mg/mL   Intravenous Q4H  .  ketorolac  30 mg Intravenous Q6H  . loratadine  10 mg Oral Daily  . OxyCODONE  10 mg Oral Q12H  . pantoprazole (PROTONIX) IV  40 mg Intravenous Q24H  . [START ON 02/22/2013] Vitamin D (Ergocalciferol)  50,000 Units Oral Q7 days   Continuous: . sodium chloride 125 mL/hr at 02/17/13 0800   ZOX:[id number], cyclobenzaprine, LORazepam, naloxone, ondansetron (ZOFRAN) IV, ondansetron, polyethylene glycol, sodium chloride Anti-infectives   None      Results for orders placed during the hospital encounter of 02/16/13 (from the past 48 hour(s))  CBC WITH DIFFERENTIAL     Status: Abnormal   Collection Time    02/16/13 10:22 AM      Result Value Range   WBC 17.2 (*) 4.0 - 10.5 K/uL   RBC 2.35 (*) 3.87 - 5.11 MIL/uL    Hemoglobin 8.2 (*) 12.0 - 15.0 g/dL   HCT 14.7 (*) 82.9 - 56.2 %   MCV 95.7  78.0 - 100.0 fL   MCH 34.9 (*) 26.0 - 34.0 pg   MCHC 36.4 (*) 30.0 - 36.0 g/dL   RDW 13.0 (*) 86.5 - 78.4 %   Platelets 205  150 - 400 K/uL   Neutrophils Relative % 77  43 - 77 %   Lymphocytes Relative 13  12 - 46 %   Monocytes Relative 9  3 - 12 %   Eosinophils Relative 1  0 - 5 %   Basophils Relative 0  0 - 1 %   Band Neutrophils 0  0 - 10 %   Metamyelocytes Relative 0     Myelocytes 0     Promyelocytes Absolute 0     Blasts 0     nRBC 0  0 /100 WBC   Neutro Abs 13.3 (*) 1.7 - 7.7 K/uL   Lymphs Abs 2.2  0.7 - 4.0 K/uL   Monocytes Absolute 1.5 (*) 0.1 - 1.0 K/uL   Eosinophils Absolute 0.2  0.0 - 0.7 K/uL   Basophils Absolute 0.0  0.0 - 0.1 K/uL   RBC Morphology MARKED POLYCHROMASIA     Comment: SICKLE CELLS     Many NRBCs   WBC Morphology WHITE COUNT CONFIRMED ON SMEAR     Comment: MILD LEFT SHIFT (1-5% METAS, OCC MYELO, OCC BANDS)     ATYPICAL LYMPHOCYTES     ATYPICAL MONONUCLEAR CELLS     VACUOLATED NEUTROPHILS   Smear Review PLATELET COUNT CONFIRMED BY SMEAR     Comment: LARGE PLATELETS PRESENT     GIANT PLATELETS SEEN  COMPREHENSIVE METABOLIC PANEL     Status: Abnormal   Collection Time    02/16/13 10:22 AM      Result Value Range   Sodium 135  135 - 145 mEq/L   Potassium 4.0  3.5 - 5.1 mEq/L   Chloride 99  96 - 112 mEq/L   CO2 27  19 - 32 mEq/L   Glucose, Bld 100 (*) 70 - 99 mg/dL   BUN 5 (*) 6 - 23 mg/dL   Creatinine, Ser 6.96  0.50 - 1.10 mg/dL   Calcium 9.3  8.4 - 29.5 mg/dL   Total Protein 8.2  6.0 - 8.3 g/dL   Albumin 3.7  3.5 - 5.2 g/dL   AST 284 (*) 0 - 37 U/L   ALT 37 (*) 0 - 35 U/L   Alkaline Phosphatase 205 (*) 39 - 117 U/L   Total Bilirubin 8.0 (*) 0.3 - 1.2 mg/dL   GFR calc non Af  Amer >90  >90 mL/min   GFR calc Af Amer >90  >90 mL/min   Comment:            The eGFR has been calculated     using the CKD EPI equation.     This calculation has not been      validated in all clinical     situations.     eGFR's persistently     <90 mL/min signify     possible Chronic Kidney Disease.  RETICULOCYTES     Status: Abnormal   Collection Time    02/16/13 10:22 AM      Result Value Range   Retic Ct Pct 39.4 (*) 0.4 - 3.1 %   Comment: RESULTS CONFIRMED BY MANUAL DILUTION   RBC. 2.35 (*) 3.87 - 5.11 MIL/uL   Retic Count, Manual 925.9 (*) 19.0 - 186.0 K/uL  TROPONIN I     Status: None   Collection Time    02/16/13 10:22 AM      Result Value Range   Troponin I <0.30  <0.30 ng/mL   Comment:            Due to the release kinetics of cTnI,     a negative result within the first hours     of the onset of symptoms does not rule out     myocardial infarction with certainty.     If myocardial infarction is still suspected,     repeat the test at appropriate intervals.  URINALYSIS, ROUTINE W REFLEX MICROSCOPIC     Status: Abnormal   Collection Time    02/16/13  8:26 PM      Result Value Range   Color, Urine YELLOW  YELLOW   APPearance CLEAR  CLEAR   Specific Gravity, Urine 1.015  1.005 - 1.030   pH 6.0  5.0 - 8.0   Glucose, UA NEGATIVE  NEGATIVE mg/dL   Hgb urine dipstick LARGE (*) NEGATIVE   Bilirubin Urine SMALL (*) NEGATIVE   Ketones, ur NEGATIVE  NEGATIVE mg/dL   Protein, ur 30 (*) NEGATIVE mg/dL   Urobilinogen, UA 1.0  0.0 - 1.0 mg/dL   Nitrite NEGATIVE  NEGATIVE   Leukocytes, UA NEGATIVE  NEGATIVE  URINE MICROSCOPIC-ADD ON     Status: None   Collection Time    02/16/13  8:26 PM      Result Value Range   WBC, UA 0-2  <3 WBC/hpf   RBC / HPF 0-2  <3 RBC/hpf  CULTURE, BLOOD (ROUTINE X 2)     Status: None   Collection Time    02/16/13  9:58 PM      Result Value Range   Specimen Description BLOOD RIGHT HAND     Special Requests BOTTLES DRAWN AEROBIC ONLY 4CC BOTTLE     Culture NO GROWTH 1 DAY     Report Status PENDING    CULTURE, BLOOD (ROUTINE X 2)     Status: None   Collection Time    02/16/13 10:03 PM      Result Value Range    Specimen Description BLOOD LEFT HAND     Special Requests       Value: BOTTLES DRAWN AEROBIC AND ANAEROBIC 6CC EACH BOTTLE   Culture NO GROWTH 1 DAY     Report Status PENDING    LACTIC ACID, PLASMA     Status: None   Collection Time    02/16/13 10:30 PM      Result Value  Range   Lactic Acid, Venous 0.9  0.5 - 2.2 mmol/L  CBC WITH DIFFERENTIAL     Status: Abnormal   Collection Time    02/17/13  6:32 AM      Result Value Range   WBC 15.3 (*) 4.0 - 10.5 K/uL   RBC 1.82 (*) 3.87 - 5.11 MIL/uL   Hemoglobin 6.6 (*) 12.0 - 15.0 g/dL   Comment: RESULT REPEATED AND VERIFIED     CRITICAL RESULT CALLED TO, READ BACK BY AND VERIFIED WITH:     THOMAS ROSS RN ON [id number] AT 0715 BY RESEGGER R   HCT 17.2 (*) 36.0 - 46.0 %   MCV 94.5  78.0 - 100.0 fL   MCH 36.3 (*) 26.0 - 34.0 pg   MCHC 38.4 (*) 30.0 - 36.0 g/dL   RDW 04.5 (*) 40.9 - 81.1 %   Platelets 155  150 - 400 K/uL   Comment: DELTA CHECK NOTED     RESULT REPEATED AND VERIFIED     PLATELET COUNT CONFIRMED BY SMEAR   Neutrophils Relative % 76  43 - 77 %   Lymphocytes Relative 18  12 - 46 %   Monocytes Relative 6  3 - 12 %   Eosinophils Relative 0  0 - 5 %   Basophils Relative 0  0 - 1 %   Band Neutrophils 0  0 - 10 %   Metamyelocytes Relative 0     Myelocytes 0     Promyelocytes Absolute 0     Blasts 0     nRBC 0  0 /100 WBC   Neutro Abs 11.6 (*) 1.7 - 7.7 K/uL   Lymphs Abs 2.8  0.7 - 4.0 K/uL   Monocytes Absolute 0.9  0.1 - 1.0 K/uL   Eosinophils Absolute 0.0  0.0 - 0.7 K/uL   Basophils Absolute 0.0  0.0 - 0.1 K/uL   RBC Morphology TARGET CELLS     Comment: MARKED POLYCHROMASIA     SICKLE CELLS     MANY NRBCS  RETICULOCYTES     Status: Abnormal (Preliminary result)   Collection Time    02/17/13  6:32 AM      Result Value Range   Retic Ct Pct PENDING  0.4 - 3.1 %   RBC. 1.82 (*) 3.87 - 5.11 MIL/uL   Retic Count, Manual PENDING  19.0 - 186.0 K/uL  COMPREHENSIVE METABOLIC PANEL     Status: Abnormal   Collection Time     02/17/13  6:32 AM      Result Value Range   Sodium 140  135 - 145 mEq/L   Potassium 3.7  3.5 - 5.1 mEq/L   Chloride 109  96 - 112 mEq/L   Comment: DELTA CHECK NOTED   CO2 26  19 - 32 mEq/L   Glucose, Bld 102 (*) 70 - 99 mg/dL   BUN 6  6 - 23 mg/dL   Creatinine, Ser 9.14  0.50 - 1.10 mg/dL   Calcium 8.3 (*) 8.4 - 10.5 mg/dL   Total Protein 6.5  6.0 - 8.3 g/dL   Albumin 2.7 (*) 3.5 - 5.2 g/dL   AST 782 (*) 0 - 37 U/L   ALT 43 (*) 0 - 35 U/L   Alkaline Phosphatase 212 (*) 39 - 117 U/L   Total Bilirubin 9.0 (*) 0.3 - 1.2 mg/dL   GFR calc non Af Amer >90  >90 mL/min   GFR calc Af Amer >90  >90 mL/min  Comment:            The eGFR has been calculated     using the CKD EPI equation.     This calculation has not been     validated in all clinical     situations.     eGFR's persistently     <90 mL/min signify     possible Chronic Kidney Disease.  PREPARE RBC (CROSSMATCH)     Status: None   Collection Time    02/17/13  9:12 AM      Result Value Range   Order Confirmation ORDER PROCESSED BY BLOOD BANK    TYPE AND SCREEN     Status: None   Collection Time    02/17/13  9:12 AM      Result Value Range   ABO/RH(D) A POS     Antibody Screen POS     Sample Expiration 02/20/2013     DAT, IgG NEG      Dg Chest Port 1 View  02/17/2013   *RADIOLOGY REPORT*  Clinical Data: History of sickle cell disease.  Evaluation for hypoxia and lung pathology.  PORTABLE CHEST - 1 VIEW  Comparison: 02/16/2013.  Findings: There is borderline cardiac silhouette enlargement.  No mediastinal or hilar lesion is identified.  There is slight upper lobe vascular prominence.  No consolidation or pleural effusion is evident.  There is minimal subsegmental atelectasis in the left lung base.  No skeletal lesion is evident.  IMPRESSION: Borderline cardiac silhouette enlargement.  Slight upper lobe vascular prominence.  No pulmonary edema, consolidation, or pleural effusion.  Minimal left basilar subsegmental  atelectasis.   Original Report Authenticated By: Onalee Hua Call   Dg Chest Port 1 View  02/16/2013   *RADIOLOGY REPORT*  Clinical Data: Short of breath  PORTABLE CHEST - 1 VIEW  Comparison: 12/05/2012  Findings: Heart size is normal.  Mediastinal shadows are normal. The vascularity is normal.  Lungs are clear.  No a few.  Previously seen PICC catheter has been removed.  IMPRESSION: No active disease.   Original Report Authenticated By: Paulina Fusi, M.D.    Review of Systems  Constitutional: Positive for malaise/fatigue.  HENT: Negative.   Eyes: Negative.   Cardiovascular: Negative.   Gastrointestinal: Positive for abdominal pain.  Genitourinary: Negative.   Musculoskeletal: Positive for myalgias.  Skin: Negative.   Neurological: Negative.   Endo/Heme/Allergies: Negative.   Psychiatric/Behavioral: Negative.    Blood pressure 118/66, pulse 112, temperature 98.1 F (36.7 C), temperature source Oral, resp. rate 15, height 5\' 3"  (1.6 m), weight 69.854 kg (154 lb), SpO2 93.00%. Physical Exam  Constitutional: She is oriented to person, place, and time. She appears well-developed and well-nourished. No distress.  Resting, moderately obese  HENT:  Head: Normocephalic and atraumatic.  Eyes: Conjunctivae and EOM are normal. Pupils are equal, round, and reactive to light.  Neck: Normal range of motion. Neck supple. No thyromegaly present.  Cardiovascular: Normal rate and regular rhythm.   Respiratory: Effort normal and breath sounds normal. No respiratory distress.  GI: Soft. Bowel sounds are normal. She exhibits no distension. There is no tenderness.  Neurological: She is alert and oriented to person, place, and time.  Skin: Skin is warm and dry.    Assessment/Plan: Chronic thrombophlebitis, sickle cell disorder, sickle cell crisis. At this time patient is being medically treated for her sickle cell crisis. There is no acute surgical indications or findings. I have discussed at length with the  patient's family and the option  of proceeding with Port-A-Cath placement. I do feel her crisis needs to be managed and demonstrate improvement prior to any further discussion of proceeding to the operating room. She does have peripheral access at this time and will continue to use the axis that is available. I will continue to follow her course. Possible placement later in the week pending her course or as outpatient per patient's decision.  Byan Poplaski C 02/17/2013, 1:38 PM

## 2013-02-17 NOTE — Progress Notes (Signed)
UR Chart Review Completed  

## 2013-02-18 DIAGNOSIS — R7989 Other specified abnormal findings of blood chemistry: Secondary | ICD-10-CM

## 2013-02-18 LAB — CBC
Platelets: 162 10*3/uL (ref 150–400)
RDW: 21.6 % — ABNORMAL HIGH (ref 11.5–15.5)
WBC: 11.5 10*3/uL — ABNORMAL HIGH (ref 4.0–10.5)

## 2013-02-18 LAB — COMPREHENSIVE METABOLIC PANEL
ALT: 35 U/L (ref 0–35)
AST: 95 U/L — ABNORMAL HIGH (ref 0–37)
Albumin: 2.5 g/dL — ABNORMAL LOW (ref 3.5–5.2)
Alkaline Phosphatase: 199 U/L — ABNORMAL HIGH (ref 39–117)
Chloride: 108 mEq/L (ref 96–112)
Potassium: 3.4 mEq/L — ABNORMAL LOW (ref 3.5–5.1)
Total Bilirubin: 10.3 mg/dL — ABNORMAL HIGH (ref 0.3–1.2)

## 2013-02-18 MED ORDER — LORAZEPAM 0.5 MG PO TABS
0.5000 mg | ORAL_TABLET | Freq: Two times a day (BID) | ORAL | Status: DC
  Administered 2013-02-18 – 2013-02-23 (×12): 0.5 mg via ORAL
  Filled 2013-02-18 (×12): qty 1

## 2013-02-18 MED ORDER — PANTOPRAZOLE SODIUM 40 MG PO TBEC
40.0000 mg | DELAYED_RELEASE_TABLET | Freq: Every day | ORAL | Status: DC
  Administered 2013-02-18 – 2013-02-23 (×6): 40 mg via ORAL
  Filled 2013-02-18 (×8): qty 1

## 2013-02-18 MED ORDER — POTASSIUM CHLORIDE CRYS ER 20 MEQ PO TBCR
40.0000 meq | EXTENDED_RELEASE_TABLET | Freq: Once | ORAL | Status: AC
  Administered 2013-02-18: 40 meq via ORAL
  Filled 2013-02-18: qty 2

## 2013-02-18 MED ORDER — HYDROMORPHONE HCL PF 1 MG/ML IJ SOLN
4.0000 mg | INTRAMUSCULAR | Status: DC
  Administered 2013-02-18 – 2013-02-19 (×8): 4 mg via INTRAVENOUS
  Filled 2013-02-18 (×5): qty 4
  Filled 2013-02-18: qty 1
  Filled 2013-02-18 (×3): qty 4

## 2013-02-18 NOTE — Progress Notes (Signed)
Dawn [name] ZOX:[id number] DOB: Mar 01, 1981 DOA: 02/16/2013 PCP: Willey Blade, MD   Subjective: This lady was admitted with sickle cell crisis . She was started on PCA pump yesterday but I do not think she really likes this. She would prefer to have scheduled intravenous hydromorphone, which I agree with. Having said that, her pain is a little bit better today, 7/10. She did have 2 units of blood transfusion yesterday. We do not have blood work done this morning apparently.           Physical Exam: Blood pressure 129/83, pulse 110, temperature 98.9 F (37.2 C), temperature source Oral, resp. rate 25, height 5\' 3"  (1.6 m), weight 69.854 kg (154 lb), SpO2 94.00%. This morning she is looks more comfortable at rest since she has been treated with intravenous Ativan. She does not appear to have respiratory distress. Lung fields are clear. There is no pleural rub, bronchial breathing, crackles or wheezing. She is alert and oriented. Heart sounds are present and normal with a resting tachycardia. There are no murmurs. She is alert and orientated. Her peripheral perfusion is adequate.   Investigations:  Recent Results (from the past 240 hour(s))  CULTURE, BLOOD (ROUTINE X 2)     Status: None   Collection Time    02/16/13  9:58 PM      Result Value Range Status   Specimen Description BLOOD RIGHT HAND   Final   Special Requests BOTTLES DRAWN AEROBIC ONLY 4CC BOTTLE   Final   Culture NO GROWTH 1 DAY   Final   Report Status PENDING   Incomplete  CULTURE, BLOOD (ROUTINE X 2)     Status: None   Collection Time    02/16/13 10:03 PM      Result Value Range Status   Specimen Description BLOOD LEFT HAND   Final   Special Requests     Final   Value: BOTTLES DRAWN AEROBIC AND ANAEROBIC 6CC EACH BOTTLE   Culture NO GROWTH 1 DAY   Final   Report Status PENDING   Incomplete     Basic Metabolic Panel:  Recent Labs  81/19/14 1022 02/17/13 0632  NA 135 140  K 4.0 3.7  CL 99 109  CO2 27 26   GLUCOSE 100* 102*  BUN 5* 6  CREATININE 0.63 0.57  CALCIUM 9.3 8.3*   Liver Function Tests:  Recent Labs  02/16/13 1022 02/17/13 0632  AST 127* 126*  ALT 37* 43*  ALKPHOS 205* 212*  BILITOT 8.0* 9.0*  PROT 8.2 6.5  ALBUMIN 3.7 2.7*     CBC:  Recent Labs  02/16/13 1022 02/17/13 0632  WBC 17.2* 15.3*  NEUTROABS 13.3* 11.6*  HGB 8.2* 6.6*  HCT 22.5* 17.2*  MCV 95.7 94.5  PLT 205 155    Dg Chest Port 1 View  02/17/2013   *RADIOLOGY REPORT*  Clinical Data: History of sickle cell disease.  Evaluation for hypoxia and lung pathology.  PORTABLE CHEST - 1 VIEW  Comparison: 02/16/2013.  Findings: There is borderline cardiac silhouette enlargement.  No mediastinal or hilar lesion is identified.  There is slight upper lobe vascular prominence.  No consolidation or pleural effusion is evident.  There is minimal subsegmental atelectasis in the left lung base.  No skeletal lesion is evident.  IMPRESSION: Borderline cardiac silhouette enlargement.  Slight upper lobe vascular prominence.  No pulmonary edema, consolidation, or pleural effusion.  Minimal left basilar subsegmental atelectasis.   Original Report Authenticated By: Onalee Hua Call   Dg  Chest Port 1 View  02/16/2013   *RADIOLOGY REPORT*  Clinical Data: Short of breath  PORTABLE CHEST - 1 VIEW  Comparison: 12/05/2012  Findings: Heart size is normal.  Mediastinal shadows are normal. The vascularity is normal.  Lungs are clear.  No a few.  Previously seen PICC catheter has been removed.  IMPRESSION: No active disease.   Original Report Authenticated By: Paulina Fusi, M.D.      Medications: I have reviewed the patient's current medications.  Impression: 1. Sickle cell crisis. 2. Sickle cell disease. 3. 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 4. Hypertension. 5. Fever, likely secondary to #1, no obvious source of infection.     Plan: 1. Discontinue PCA pump. Start Dilantin intravenously 4 mg IV  every 3 hours scheduled. 2. Start oral Ativan 0.5 mg twice a day with intravenous when necessary doses as required for severe spasm. 3. Follow hemoglobin closely. 4. Port-A-Cath insertion once crisis is resolved.  Consultants:   none.   Procedures:  None.   Antibiotics:  None.                   Code Status: Full code.  Family Communication: Discussed plan with the patient at the bedside.   Disposition Plan: Home when medically stable.  Time spent: 20 minutes.   LOS: 2 days   Wilson Singer Pager [phone]  02/18/2013, 8:54 AM

## 2013-02-19 LAB — TYPE AND SCREEN
ABO/RH(D): A POS
Unit division: 0

## 2013-02-19 LAB — CBC
MCH: 33.6 pg (ref 26.0–34.0)
MCHC: 36.9 g/dL — ABNORMAL HIGH (ref 30.0–36.0)
MCV: 91.1 fL (ref 78.0–100.0)
Platelets: 148 10*3/uL — ABNORMAL LOW (ref 150–400)
RBC: 2.47 MIL/uL — ABNORMAL LOW (ref 3.87–5.11)
RDW: 21.8 % — ABNORMAL HIGH (ref 11.5–15.5)

## 2013-02-19 LAB — COMPREHENSIVE METABOLIC PANEL
CO2: 26 mEq/L (ref 19–32)
Calcium: 8.5 mg/dL (ref 8.4–10.5)
Creatinine, Ser: 0.62 mg/dL (ref 0.50–1.10)
GFR calc Af Amer: >90 mL/min (ref >90)
GFR calc non Af Amer: >90 mL/min (ref >90)
Glucose, Bld: 84 mg/dL (ref 70–99)
Total Protein: 6.8 g/dL (ref 6.0–8.3)

## 2013-02-19 MED ORDER — HYDROMORPHONE HCL PF 1 MG/ML IJ SOLN
1.0000 mg | INTRAMUSCULAR | Status: DC | PRN
  Administered 2013-02-19 (×3): 2 mg via INTRAVENOUS
  Administered 2013-02-19: 1 mg via INTRAVENOUS
  Administered 2013-02-20 (×3): 2 mg via INTRAVENOUS
  Filled 2013-02-19 (×5): qty 2

## 2013-02-19 NOTE — Progress Notes (Signed)
Pt has had nosebleed for over an hour now. Have held pressure and applied ice. Bleeding continues. Paged Dr [name]. Orders given to continue to hold pressure and hold Heparin this am. AM Hospitalist will see her in the am to determine if she needs an ENT consult.

## 2013-02-19 NOTE — Progress Notes (Signed)
  Subjective: Still with significant pain. States it is slightly down from her admission which she rated as a 10 out of 10 and now states it is more consistent with an 8/10.  Objective: Vital signs in last 24 hours: Temp:  [97.9 F (36.6 C)-98.3 F (36.8 C)] 98.3 F (36.8 C) (05/29 0612) Pulse Rate:  [106-115] 115 (05/29 0612) Resp:  [20-22] 20 (05/29 0612) BP: (117-139)/(71-83) 139/83 mmHg (05/29 0612) SpO2:  [95 %-96 %] 95 % (05/29 0612) Last BM Date: 02/18/13  Intake/Output from previous day: 05/28 0701 - 05/29 0700 In: 3855 [P.O.:480; I.V.:2875; IV Piggyback:500] Out: 1851 [Urine:1850; Stool:1] Intake/Output this shift: Total I/O In: 120 [P.O.:120] Out: 550 [Urine:550]  General appearance: alert and no distress GI: soft, non-tender; bowel sounds normal; no masses,  no organomegaly  Lab Results:   Recent Labs  02/18/13 0833 02/19/13 0452  WBC 11.5* 10.2  HGB 8.7* 8.3*  HCT 23.6* 22.5*  PLT 162 148*   BMET  Recent Labs  02/18/13 0833 02/19/13 0452  NA 140 140  K 3.4* 4.2  CL 108 108  CO2 26 26  GLUCOSE 93 84  BUN 6 7  CREATININE 0.51 0.62  CALCIUM 8.3* 8.5   PT/INR No results found for this basename: LABPROT, INR,  in the last 72 hours ABG No results found for this basename: PHART, PCO2, PO2, HCO3,  in the last 72 hours  Studies/Results: No results found.  Anti-infectives: Anti-infectives   None      Assessment/Plan: s/p * No surgery found * Sickle cell crisis. At this time I again have discussed with the patient and family options for placement of a Port-A-Cath. I do feel she needs to be demonstrating resolution of her crisis before proceeding. At this time as I discussed with the patient we'll see how she feels to the remainder of the day. She she had significant resolution of her symptomatology this afternoon we'll tentatively plan to proceed to the operating room tomorrow however she continues to have significant sequelae of her crisis the  port will likely be pushed into next week. I have also discussed with the patient both inpatient and outpatient placement of the port. She understands her options and will continue at this time the plan to place as an inpatient prior to discharge however we'll base this on her continued progression.  LOS: 3 days    Laymon Stockert C 02/19/2013

## 2013-02-19 NOTE — Progress Notes (Signed)
[facility] PROGRESS NOTE  Dawn [name] VWU:[id number] DOB: Nov 27, 1980 DOA: 02/16/2013 PCP: August Saucer ERIC, MD  Assessment/Plan: 1. Sickle cell crisis. Appears to be slowly improving.  Patient was taken off dilaudid PCA pump yesterday.  She has been started on prn dilaudid.  Continue oxycontin and hydrea.  Dr. Leticia Penna following for possible portacath placement. 2. 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  S/p 2 unit prbc.  Hemoglobin has improved.  Continue to follow. 3. Epistaxis.  Likely due to drying of nasal mucosa from oxygen.  This appears to have resolved.  Continue to hold heparin.  Will continue to follow. 4. Elevated liver function tests.  Likely related to hemolysis.  Will continue to follow.  Code Status: full code Family Communication: discussed with patient Disposition Plan: discharge home once stable   Consultants:  General Surgery  Procedures:  none  Antibiotics:  none  HPI/Subjective: Pain is better than yesterday, but still present.  Had an episode of vomiting this morning.  Objective: Filed Vitals:   02/18/13 0731 02/18/13 1400 02/18/13 2200 02/19/13 0612  BP:  129/71 117/71 139/83  Pulse:  107 106 115  Temp:  98 F (36.7 C) 97.9 F (36.6 C) 98.3 F (36.8 C)  TempSrc:  Oral    Resp: 25 22 20 20   Height:      Weight:      SpO2: 94% 96% 95% 95%    Intake/Output Summary (Last 24 hours) at 02/19/13 1113 Last data filed at 02/19/13 0800  Gross per 24 hour  Intake   3735 ml  Output   2001 ml  Net   1734 ml   Filed Weights   02/16/13 0952 02/16/13 1659  Weight: 69.854 kg (154 lb) 69.854 kg (154 lb)    Exam:   General:  NAD  Cardiovascular: S1, S2 RRR  Respiratory: CTA B  Abdomen: soft, nt, nd, bs+  Musculoskeletal: pain in lower back and chest on palpation   Data Reviewed: Basic Metabolic Panel:  Recent Labs Lab 02/16/13 1022 02/17/13 0632 02/18/13 0833 02/19/13 0452  NA 135 140 140 140  K 4.0 3.7 3.4* 4.2  CL 99 109 108 108  CO2 27  26 26 26   GLUCOSE 100* 102* 93 84  BUN 5* 6 6 7   CREATININE 0.63 0.57 0.51 0.62  CALCIUM 9.3 8.3* 8.3* 8.5   Liver Function Tests:  Recent Labs Lab 02/16/13 1022 02/17/13 0632 02/18/13 0833 02/19/13 0452  AST 127* 126* 95* 106*  ALT 37* 43* 35 37*  ALKPHOS 205* 212* 199* 197*  BILITOT 8.0* 9.0* 10.3* 9.8*  PROT 8.2 6.5 6.5 6.8  ALBUMIN 3.7 2.7* 2.5* 2.6*   No results found for this basename: LIPASE, AMYLASE,  in the last 168 hours No results found for this basename: AMMONIA,  in the last 168 hours CBC:  Recent Labs Lab 02/16/13 1022 02/17/13 0632 02/18/13 0833 02/19/13 0452  WBC 17.2* 15.3* 11.5* 10.2  NEUTROABS 13.3* 11.6*  --   --   HGB 8.2* 6.6* 8.7* 8.3*  HCT 22.5* 17.2* 23.6* 22.5*  MCV 95.7 94.5 91.5 91.1  PLT 205 155 162 148*   Cardiac Enzymes:  Recent Labs Lab 02/16/13 1022  TROPONINI <0.30   BNP (last 3 results)  Recent Labs  03/25/12 0847  PROBNP 255.2*   CBG: No results found for this basename: GLUCAP,  in the last 168 hours  Recent Results (from the past 240 hour(s))  CULTURE, BLOOD (ROUTINE X 2)     Status: None  Collection Time    02/16/13  9:58 PM      Result Value Range Status   Specimen Description BLOOD RIGHT HAND   Final   Special Requests BOTTLES DRAWN AEROBIC ONLY 4CC BOTTLE   Final   Culture NO GROWTH 2 DAYS   Final   Report Status PENDING   Incomplete  CULTURE, BLOOD (ROUTINE X 2)     Status: None   Collection Time    02/16/13 10:03 PM      Result Value Range Status   Specimen Description BLOOD LEFT HAND   Final   Special Requests     Final   Value: BOTTLES DRAWN AEROBIC AND ANAEROBIC 6CC EACH BOTTLE   Culture NO GROWTH 2 DAYS   Final   Report Status PENDING   Incomplete     Studies: No results found.  Scheduled Meds: . deferoxamine (DESFERAL) IV  2,000 mg Intravenous QHS  . folic acid  1 mg Oral Daily  . ketorolac  30 mg Intravenous Q6H  . loratadine  10 mg Oral Daily  . LORazepam  0.5 mg Oral BID  .  OxyCODONE  10 mg Oral Q12H  . pantoprazole  40 mg Oral QAC breakfast  . [START ON 02/22/2013] Vitamin D (Ergocalciferol)  50,000 Units Oral Q7 days   Continuous Infusions: . sodium chloride 125 mL/hr at 02/19/13 1041    Active Problems:   Sickle cell crisis   Sickle cell disease   HTN (hypertension)   Reactive depression (situational)    Time spent:    MEMON,JEHANZEB  [facility] Pager [phone]. If 7PM-7AM, please contact night-coverage at [email or web address], password [facility] 02/19/2013, 11:13 AM  LOS: 3 days

## 2013-02-20 LAB — COMPREHENSIVE METABOLIC PANEL
ALT: 28 U/L (ref 0–35)
AST: 75 U/L — ABNORMAL HIGH (ref 0–37)
Albumin: 2.3 g/dL — ABNORMAL LOW (ref 3.5–5.2)
Alkaline Phosphatase: 169 U/L — ABNORMAL HIGH (ref 39–117)
CO2: 27 mEq/L (ref 19–32)
Chloride: 105 mEq/L (ref 96–112)
Potassium: 3.2 mEq/L — ABNORMAL LOW (ref 3.5–5.1)
Sodium: 140 mEq/L (ref 135–145)
Total Bilirubin: 5.9 mg/dL — ABNORMAL HIGH (ref 0.3–1.2)

## 2013-02-20 LAB — CBC
MCH: 34.1 pg — ABNORMAL HIGH (ref 26.0–34.0)
MCHC: 37.5 g/dL — ABNORMAL HIGH (ref 30.0–36.0)
MCV: 90.8 fL (ref 78.0–100.0)
Platelets: 157 10*3/uL (ref 150–400)
RBC: 2.29 MIL/uL — ABNORMAL LOW (ref 3.87–5.11)
RDW: 22.7 % — ABNORMAL HIGH (ref 11.5–15.5)

## 2013-02-20 MED ORDER — OXYCODONE HCL 5 MG PO TABS
10.0000 mg | ORAL_TABLET | ORAL | Status: DC | PRN
  Administered 2013-02-20 – 2013-02-22 (×12): 10 mg via ORAL
  Filled 2013-02-20: qty 1
  Filled 2013-02-20 (×3): qty 2
  Filled 2013-02-20: qty 1
  Filled 2013-02-20 (×8): qty 2

## 2013-02-20 MED ORDER — POTASSIUM CHLORIDE CRYS ER 20 MEQ PO TBCR
40.0000 meq | EXTENDED_RELEASE_TABLET | ORAL | Status: AC
  Administered 2013-02-20 (×2): 40 meq via ORAL
  Filled 2013-02-20 (×2): qty 2

## 2013-02-20 NOTE — Progress Notes (Signed)
[facility] PROGRESS NOTE  Dawn [name] WUJ:[id number] DOB: 1981/04/23 DOA: 02/16/2013 PCP: August Saucer ERIC, MD  Assessment/Plan: 1. Sickle cell crisis. Patient reports further improvement in pain. She is willing to try to come off of the Dilaudid today and try oxycodone for breakthrough pain. Will continue with OxyContin for long-acting pain relief. Discontinue IV fluids as she appears to be adequately hydrated. Ambulate in hall. Discussed with Dr. Leticia Penna and plans for Port-A-Cath may need to be done as an outpatient next week if patient is able to discharge home over the weekend. 2. 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  S/p 2 unit prbc.  Hemoglobin has improved.  Continue to follow. 3. Epistaxis.  Likely due to drying of nasal mucosa from oxygen.  This appears to have resolved.  Continue to hold heparin.  Will continue to follow. 4. Elevated liver function tests.  Likely related to hemolysis.  Improving. Will continue to follow. 5. Hypoxia. We'll remove oxygen today and check oxygen saturations on ear lobe/forehead to see if this is a true hypoxia. The patient does desaturate, then we may need to repeat chest x-ray.  Code Status: full code Family Communication: discussed with patient Disposition Plan: discharge home once stable   Consultants:  General Surgery  Procedures:  none  Antibiotics:  none  HPI/Subjective: Reports pain is doing better today. Nausea is doing better. Has noticed some blood on tissue when wiping noticed. Does not feel significantly short of breath when oxygen is removed. Mild cough.  Objective: Filed Vitals:   02/20/13 0211 02/20/13 0515 02/20/13 1019 02/20/13 1340  BP: 126/81 120/76 119/69 122/68  Pulse: 100 100 97 65  Temp: 98.3 F (36.8 C) 98.2 F (36.8 C) 98 F (36.7 C) 98.3 F (36.8 C)  TempSrc: Oral Oral Oral Oral  Resp: 20 18 18 18   Height:      Weight:      SpO2: 100% 100% 97% 89%    Intake/Output Summary (Last 24 hours) at 02/20/13 1510 Last  data filed at 02/20/13 1300  Gross per 24 hour  Intake 2957.92 ml  Output      1 ml  Net 2956.92 ml   Filed Weights   02/16/13 0952 02/16/13 1659  Weight: 69.854 kg (154 lb) 69.854 kg (154 lb)    Exam:   General:  NAD  Cardiovascular: S1, S2 RRR  Respiratory: CTA B  Abdomen: soft, nt, nd, bs+  Musculoskeletal: pain in lower back and chest on palpation   Data Reviewed: Basic Metabolic Panel:  Recent Labs Lab 02/16/13 1022 02/17/13 0632 02/18/13 0833 02/19/13 0452 02/20/13 0457  NA 135 140 140 140 140  K 4.0 3.7 3.4* 4.2 3.2*  CL 99 109 108 108 105  CO2 27 26 26 26 27   GLUCOSE 100* 102* 93 84 88  BUN 5* 6 6 7 6   CREATININE 0.63 0.57 0.51 0.62 0.60  CALCIUM 9.3 8.3* 8.3* 8.5 8.2*   Liver Function Tests:  Recent Labs Lab 02/16/13 1022 02/17/13 0632 02/18/13 0833 02/19/13 0452 02/20/13 0457  AST 127* 126* 95* 106* 75*  ALT 37* 43* 35 37* 28  ALKPHOS 205* 212* 199* 197* 169*  BILITOT 8.0* 9.0* 10.3* 9.8* 5.9*  PROT 8.2 6.5 6.5 6.8 6.2  ALBUMIN 3.7 2.7* 2.5* 2.6* 2.3*   No results found for this basename: LIPASE, AMYLASE,  in the last 168 hours No results found for this basename: AMMONIA,  in the last 168 hours CBC:  Recent Labs Lab 02/16/13 1022 02/17/13 0632 02/18/13  4098 02/19/13 0452 02/20/13 0457  WBC 17.2* 15.3* 11.5* 10.2 9.1  NEUTROABS 13.3* 11.6*  --   --   --   HGB 8.2* 6.6* 8.7* 8.3* 7.8*  HCT 22.5* 17.2* 23.6* 22.5* 20.8*  MCV 95.7 94.5 91.5 91.1 90.8  PLT 205 155 162 148* 157   Cardiac Enzymes:  Recent Labs Lab 02/16/13 1022  TROPONINI <0.30   BNP (last 3 results)  Recent Labs  03/25/12 0847  PROBNP 255.2*   CBG: No results found for this basename: GLUCAP,  in the last 168 hours  Recent Results (from the past 240 hour(s))  CULTURE, BLOOD (ROUTINE X 2)     Status: None   Collection Time    02/16/13  9:58 PM      Result Value Range Status   Specimen Description BLOOD RIGHT HAND   Final   Special Requests BOTTLES  DRAWN AEROBIC ONLY 4CC BOTTLE   Final   Culture NO GROWTH 3 DAYS   Final   Report Status PENDING   Incomplete  CULTURE, BLOOD (ROUTINE X 2)     Status: None   Collection Time    02/16/13 10:03 PM      Result Value Range Status   Specimen Description BLOOD LEFT HAND   Final   Special Requests     Final   Value: BOTTLES DRAWN AEROBIC AND ANAEROBIC 6CC EACH BOTTLE   Culture NO GROWTH 3 DAYS   Final   Report Status PENDING   Incomplete     Studies: No results found.  Scheduled Meds: . deferoxamine (DESFERAL) IV  2,000 mg Intravenous QHS  . folic acid  1 mg Oral Daily  . ketorolac  30 mg Intravenous Q6H  . loratadine  10 mg Oral Daily  . LORazepam  0.5 mg Oral BID  . OxyCODONE  10 mg Oral Q12H  . pantoprazole  40 mg Oral QAC breakfast  . [START ON 02/22/2013] Vitamin D (Ergocalciferol)  50,000 Units Oral Q7 days   Continuous Infusions: . sodium chloride 50 mL/hr at 02/19/13 1233    Active Problems:   Sickle cell crisis   Sickle cell disease   HTN (hypertension)   Reactive depression (situational)    Time spent:    MEMON,JEHANZEB  [facility] Pager [phone]. If 7PM-7AM, please contact night-coverage at [email or web address], password [facility] 02/20/2013, 3:10 PM  LOS: 4 days

## 2013-02-20 NOTE — Progress Notes (Signed)
UR Chart Review Completed  

## 2013-02-20 NOTE — Progress Notes (Signed)
Patient ID: Dawn [name], female   DOB: 03-28-1981, 32 y.o.   MRN: [id number]  Slow improvement, will tentatively schedule for port placement on Monday.

## 2013-02-21 DIAGNOSIS — E876 Hypokalemia: Secondary | ICD-10-CM

## 2013-02-21 LAB — CBC
MCV: 92.9 fL (ref 78.0–100.0)
Platelets: 166 10*3/uL (ref 150–400)
RDW: 23.8 % — ABNORMAL HIGH (ref 11.5–15.5)
WBC: 7 10*3/uL (ref 4.0–10.5)

## 2013-02-21 LAB — BASIC METABOLIC PANEL
CO2: 28 mEq/L (ref 19–32)
Calcium: 8.5 mg/dL (ref 8.4–10.5)
Creatinine, Ser: 0.53 mg/dL (ref 0.50–1.10)
GFR calc non Af Amer: >90 mL/min (ref >90)

## 2013-02-21 LAB — MAGNESIUM: Magnesium: 1.8 mg/dL (ref 1.5–2.5)

## 2013-02-21 MED ORDER — POTASSIUM CHLORIDE CRYS ER 20 MEQ PO TBCR
40.0000 meq | EXTENDED_RELEASE_TABLET | ORAL | Status: AC
  Administered 2013-02-21 (×3): 40 meq via ORAL
  Filled 2013-02-21 (×3): qty 2

## 2013-02-21 NOTE — Progress Notes (Signed)
[facility] PROGRESS NOTE  Dawn [name] ZOX:[id number] DOB: 1980-12-07 DOA: 02/16/2013 PCP: August Saucer ERIC, MD  Assessment/Plan: 1. Sickle cell crisis. Patient has been transitioned to oral pain medicine. She reports a mild increase in her pain since yesterday. Overall she is feeling better. She wishes to stay on oral pain medications for today and make sure that she will be able to manage at home. If patient's pain is manageable today, she can likely discharge home tomorrow.. 2. 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  S/p 2 unit prbc.  Her hemoglobin started to trend down. We will repeat CBC tomorrow to ensure that this is stable. Continue to follow. 3. Epistaxis.  Likely due to drying of nasal mucosa from oxygen.  This appears to have resolved.  Continue to hold heparin.  Will continue to follow. 4. Elevated liver function tests.  Likely related to hemolysis.  Improving. Will continue to follow. 5. Hypoxia. Resolved. Patient is currently breathing comfortably on room air  Code Status: full code Family Communication: discussed with patient Disposition Plan: discharge home once stable, likely tomorrow   Consultants:  General Surgery  Procedures:  none  Antibiotics:  none  HPI/Subjective: She reports that overall she is feeling better. The epistaxis has resolved. She does not have any shortness of breath. Pain is still present.  Objective: Filed Vitals:   02/20/13 2043 02/21/13 0200 02/21/13 0554 02/21/13 0840  BP: 121/72 124/80 117/78   Pulse: 103 99 98   Temp: 98.5 F (36.9 C) 98.9 F (37.2 C) 98.2 F (36.8 C)   TempSrc: Oral Oral Oral   Resp: 20 20 18    Height:      Weight:      SpO2: 91% 97% 96% 95%    Intake/Output Summary (Last 24 hours) at 02/21/13 1125 Last data filed at 02/20/13 1300  Gross per 24 hour  Intake    200 ml  Output      0 ml  Net    200 ml   Filed Weights   02/16/13 0952 02/16/13 1659  Weight: 69.854 kg (154 lb) 69.854 kg (154 lb)     Exam:   General:  NAD  Cardiovascular: S1, S2 RRR  Respiratory: CTA B  Abdomen: soft, nt, nd, bs+  Musculoskeletal: pain in lower back and chest on palpation   Data Reviewed: Basic Metabolic Panel:  Recent Labs Lab 02/17/13 0632 02/18/13 0833 02/19/13 0452 02/20/13 0457 02/21/13 0607  NA 140 140 140 140 140  K 3.7 3.4* 4.2 3.2* 3.2*  CL 109 108 108 105 106  CO2 26 26 26 27 28   GLUCOSE 102* 93 84 88 81  BUN 6 6 7 6  5*  CREATININE 0.57 0.51 0.62 0.60 0.53  CALCIUM 8.3* 8.3* 8.5 8.2* 8.5   Liver Function Tests:  Recent Labs Lab 02/16/13 1022 02/17/13 0632 02/18/13 0833 02/19/13 0452 02/20/13 0457  AST 127* 126* 95* 106* 75*  ALT 37* 43* 35 37* 28  ALKPHOS 205* 212* 199* 197* 169*  BILITOT 8.0* 9.0* 10.3* 9.8* 5.9*  PROT 8.2 6.5 6.5 6.8 6.2  ALBUMIN 3.7 2.7* 2.5* 2.6* 2.3*   No results found for this basename: LIPASE, AMYLASE,  in the last 168 hours No results found for this basename: AMMONIA,  in the last 168 hours CBC:  Recent Labs Lab 02/16/13 1022 02/17/13 0632 02/18/13 0833 02/19/13 0452 02/20/13 0457 02/21/13 0607  WBC 17.2* 15.3* 11.5* 10.2 9.1 7.0  NEUTROABS 13.3* 11.6*  --   --   --   --  HGB 8.2* 6.6* 8.7* 8.3* 7.8* 7.3*  HCT 22.5* 17.2* 23.6* 22.5* 20.8* 20.8*  MCV 95.7 94.5 91.5 91.1 90.8 92.9  PLT 205 155 162 148* 157 166   Cardiac Enzymes:  Recent Labs Lab 02/16/13 1022  TROPONINI <0.30   BNP (last 3 results)  Recent Labs  03/25/12 0847  PROBNP 255.2*   CBG: No results found for this basename: GLUCAP,  in the last 168 hours  Recent Results (from the past 240 hour(s))  CULTURE, BLOOD (ROUTINE X 2)     Status: None   Collection Time    02/16/13  9:58 PM      Result Value Range Status   Specimen Description BLOOD RIGHT HAND   Final   Special Requests BOTTLES DRAWN AEROBIC ONLY 4CC BOTTLE   Final   Culture NO GROWTH 3 DAYS   Final   Report Status PENDING   Incomplete  CULTURE, BLOOD (ROUTINE X 2)     Status:  None   Collection Time    02/16/13 10:03 PM      Result Value Range Status   Specimen Description BLOOD LEFT HAND   Final   Special Requests     Final   Value: BOTTLES DRAWN AEROBIC AND ANAEROBIC 6CC EACH BOTTLE   Culture NO GROWTH 3 DAYS   Final   Report Status PENDING   Incomplete     Studies: No results found.  Scheduled Meds: . deferoxamine (DESFERAL) IV  2,000 mg Intravenous QHS  . folic acid  1 mg Oral Daily  . ketorolac  30 mg Intravenous Q6H  . loratadine  10 mg Oral Daily  . LORazepam  0.5 mg Oral BID  . OxyCODONE  10 mg Oral Q12H  . pantoprazole  40 mg Oral QAC breakfast  . potassium chloride  40 mEq Oral Q3H  . [START ON 02/22/2013] Vitamin D (Ergocalciferol)  50,000 Units Oral Q7 days   Continuous Infusions:    Active Problems:   Sickle cell crisis   Sickle cell disease   HTN (hypertension)   Reactive depression (situational)    Time spent:    MEMON,JEHANZEB  [facility] Pager [phone]. If 7PM-7AM, please contact night-coverage at [email or web address], password [facility] 02/21/2013, 11:25 AM  LOS: 5 days

## 2013-02-22 ENCOUNTER — Inpatient Hospital Stay (HOSPITAL_COMMUNITY): Payer: Medicare Other

## 2013-02-22 LAB — CULTURE, BLOOD (ROUTINE X 2): Culture: NO GROWTH

## 2013-02-22 LAB — CBC
MCH: 33.2 pg (ref 26.0–34.0)
MCHC: 35.3 g/dL (ref 30.0–36.0)
Platelets: 201 10*3/uL (ref 150–400)
RBC: 2.32 MIL/uL — ABNORMAL LOW (ref 3.87–5.11)

## 2013-02-22 MED ORDER — DIPHENHYDRAMINE HCL 50 MG/ML IJ SOLN
12.5000 mg | Freq: Four times a day (QID) | INTRAMUSCULAR | Status: DC | PRN
  Administered 2013-02-22 – 2013-02-23 (×3): 12.5 mg via INTRAVENOUS
  Filled 2013-02-22 (×3): qty 1

## 2013-02-22 MED ORDER — KETOROLAC TROMETHAMINE 30 MG/ML IJ SOLN
30.0000 mg | Freq: Four times a day (QID) | INTRAMUSCULAR | Status: DC
  Administered 2013-02-22 – 2013-02-24 (×5): 30 mg via INTRAVENOUS
  Filled 2013-02-22 (×5): qty 1

## 2013-02-22 NOTE — Progress Notes (Signed)
[facility] PROGRESS NOTE  Dawn [name] UJW:[id number] DOB: Aug 02, 1981 DOA: 02/16/2013 PCP: August Saucer ERIC, MD  Assessment/Plan: 1. Sickle cell crisis. Patient has been transitioned to oral pain medicine. She reports worsening pain today. She is reluctant to discharge home with her worsening symptoms. We will restart Toradol and continue current pain regimen. She is scheduled for Port-A-Cath placement tomorrow by Dr. Leticia Penna. We will have this done prior to her discharge. Once this procedure is complete, she can likely discharge home. 2. 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  S/p 2 unit prbc.  Hemoglobin has stabilized. 3. Epistaxis.  Likely due to drying of nasal mucosa from oxygen.  This appears to have resolved.  Continue to hold heparin.  Will continue to follow. 4. Elevated liver function tests.  Likely related to hemolysis.  Improving. Will continue to follow. 5. Hypoxia. Resolved. Patient is currently breathing comfortably on room air  Code Status: full code Family Communication: discussed with patient Disposition Plan: Patient to get Port-A-Cath tomorrow, can likely discharge home afterwards.   Consultants:  General Surgery  Procedures:  none  Antibiotics:  none  HPI/Subjective: She was feeling better, but reports worsening pain today, mostly in her chest. She feels this is her sickle cell pain.  Objective: Filed Vitals:   02/21/13 2047 02/22/13 0218 02/22/13 0431 02/22/13 1441  BP: 118/77 121/81 121/73 113/78  Pulse: 99 94 90 96  Temp: 98.7 F (37.1 C)  98.2 F (36.8 C) 99 F (37.2 C)  TempSrc: Oral  Oral Oral  Resp: 18 18 18 18   Height:      Weight:      SpO2: 93% 94% 100% 94%    Intake/Output Summary (Last 24 hours) at 02/22/13 1924 Last data filed at 02/22/13 1800  Gross per 24 hour  Intake    940 ml  Output      0 ml  Net    940 ml   Filed Weights   02/16/13 0952 02/16/13 1659  Weight: 69.854 kg (154 lb) 69.854 kg (154 lb)    Exam:   General:   NAD  Cardiovascular: S1, S2 RRR  Respiratory: CTA B  Abdomen: soft, nt, nd, bs+  Musculoskeletal: No pedal edema bilaterally  Data Reviewed: Basic Metabolic Panel:  Recent Labs Lab 02/17/13 0632 02/18/13 0833 02/19/13 0452 02/20/13 0457 02/21/13 0607  NA 140 140 140 140 140  K 3.7 3.4* 4.2 3.2* 3.2*  CL 109 108 108 105 106  CO2 26 26 26 27 28   GLUCOSE 102* 93 84 88 81  BUN 6 6 7 6  5*  CREATININE 0.57 0.51 0.62 0.60 0.53  CALCIUM 8.3* 8.3* 8.5 8.2* 8.5  MG  --   --   --   --  1.8   Liver Function Tests:  Recent Labs Lab 02/16/13 1022 02/17/13 0632 02/18/13 0833 02/19/13 0452 02/20/13 0457  AST 127* 126* 95* 106* 75*  ALT 37* 43* 35 37* 28  ALKPHOS 205* 212* 199* 197* 169*  BILITOT 8.0* 9.0* 10.3* 9.8* 5.9*  PROT 8.2 6.5 6.5 6.8 6.2  ALBUMIN 3.7 2.7* 2.5* 2.6* 2.3*   No results found for this basename: LIPASE, AMYLASE,  in the last 168 hours No results found for this basename: AMMONIA,  in the last 168 hours CBC:  Recent Labs Lab 02/16/13 1022 02/17/13 0632 02/18/13 0833 02/19/13 0452 02/20/13 0457 02/21/13 0607 02/22/13 0630  WBC 17.2* 15.3* 11.5* 10.2 9.1 7.0 10.0  NEUTROABS 13.3* 11.6*  --   --   --   --   --  HGB 8.2* 6.6* 8.7* 8.3* 7.8* 7.3* 7.7*  HCT 22.5* 17.2* 23.6* 22.5* 20.8* 20.8* 21.8*  MCV 95.7 94.5 91.5 91.1 90.8 92.9 94.0  PLT 205 155 162 148* 157 166 201   Cardiac Enzymes:  Recent Labs Lab 02/16/13 1022  TROPONINI <0.30   BNP (last 3 results)  Recent Labs  03/25/12 0847  PROBNP 255.2*   CBG: No results found for this basename: GLUCAP,  in the last 168 hours  Recent Results (from the past 240 hour(s))  CULTURE, BLOOD (ROUTINE X 2)     Status: None   Collection Time    02/16/13  9:58 PM      Result Value Range Status   Specimen Description BLOOD RIGHT HAND   Final   Special Requests BOTTLES DRAWN AEROBIC ONLY 4CC BOTTLE   Final   Culture NO GROWTH 6 DAYS   Final   Report Status 02/22/2013 FINAL   Final  CULTURE,  BLOOD (ROUTINE X 2)     Status: None   Collection Time    02/16/13 10:03 PM      Result Value Range Status   Specimen Description BLOOD LEFT HAND   Final   Special Requests     Final   Value: BOTTLES DRAWN AEROBIC AND ANAEROBIC 6CC EACH BOTTLE   Culture NO GROWTH 6 DAYS   Final   Report Status 02/22/2013 FINAL   Final     Studies: No results found.  Scheduled Meds: . deferoxamine (DESFERAL) IV  2,000 mg Intravenous QHS  . folic acid  1 mg Oral Daily  . ketorolac  30 mg Intravenous Q6H  . loratadine  10 mg Oral Daily  . LORazepam  0.5 mg Oral BID  . OxyCODONE  10 mg Oral Q12H  . pantoprazole  40 mg Oral QAC breakfast  . Vitamin D (Ergocalciferol)  50,000 Units Oral Q7 days   Continuous Infusions:    Active Problems:   Sickle cell crisis   Sickle cell disease   HTN (hypertension)   Reactive depression (situational)    Time spent:    [name]  [facility] Pager [phone]. If 7PM-7AM, please contact night-coverage at [email or web address], password [facility] 02/22/2013, 7:24 PM  LOS: 6 days

## 2013-02-23 ENCOUNTER — Inpatient Hospital Stay (HOSPITAL_COMMUNITY): Payer: Medicare Other

## 2013-02-23 ENCOUNTER — Inpatient Hospital Stay (HOSPITAL_COMMUNITY): Payer: Medicare Other | Admitting: Anesthesiology

## 2013-02-23 ENCOUNTER — Encounter (HOSPITAL_COMMUNITY)
Admission: EM | Disposition: A | Discharge status: Home / Self Care | Payer: Self-pay | Source: Home / Self Care | Attending: Internal Medicine

## 2013-02-23 ENCOUNTER — Encounter (HOSPITAL_COMMUNITY): Payer: Self-pay | Admitting: *Deleted

## 2013-02-23 ENCOUNTER — Encounter (HOSPITAL_COMMUNITY): Payer: Self-pay | Admitting: Anesthesiology

## 2013-02-23 HISTORY — PX: PORTACATH PLACEMENT: SHX2246

## 2013-02-23 LAB — BASIC METABOLIC PANEL
Chloride: 103 mEq/L (ref 96–112)
GFR calc Af Amer: >90 mL/min (ref >90)
Potassium: 3.3 mEq/L — ABNORMAL LOW (ref 3.5–5.1)

## 2013-02-23 LAB — CBC
Platelets: 191 10*3/uL (ref 150–400)
RDW: 24.2 % — ABNORMAL HIGH (ref 11.5–15.5)
WBC: 8.1 10*3/uL (ref 4.0–10.5)

## 2013-02-23 LAB — SURGICAL PCR SCREEN
MRSA, PCR: NEGATIVE
Staphylococcus aureus: POSITIVE — AB

## 2013-02-23 LAB — PREPARE RBC (CROSSMATCH)

## 2013-02-23 SURGERY — INSERTION, TUNNELED CENTRAL VENOUS DEVICE, WITH PORT
Anesthesia: Monitor Anesthesia Care | Site: Chest | Laterality: Left | Wound class: Clean

## 2013-02-23 MED ORDER — LIDOCAINE HCL (PF) 1 % IJ SOLN
INTRAMUSCULAR | Status: DC | PRN
  Administered 2013-02-23: 20 mL

## 2013-02-23 MED ORDER — FENTANYL CITRATE 0.05 MG/ML IJ SOLN
25.0000 ug | INTRAMUSCULAR | Status: DC | PRN
  Administered 2013-02-23 (×5): 50 ug via INTRAVENOUS
  Administered 2013-02-23: 25 ug via INTRAVENOUS
  Administered 2013-02-24: 50 ug via INTRAVENOUS
  Filled 2013-02-23 (×3): qty 2

## 2013-02-23 MED ORDER — HEPARIN SODIUM (PORCINE) 1000 UNIT/ML IJ SOLN
INTRAMUSCULAR | Status: AC
  Filled 2013-02-23: qty 3

## 2013-02-23 MED ORDER — POTASSIUM CHLORIDE CRYS ER 20 MEQ PO TBCR
40.0000 meq | EXTENDED_RELEASE_TABLET | Freq: Once | ORAL | Status: AC
  Administered 2013-02-23: 40 meq via ORAL
  Filled 2013-02-23: qty 1

## 2013-02-23 MED ORDER — FENTANYL CITRATE 0.05 MG/ML IJ SOLN
INTRAMUSCULAR | Status: AC
  Filled 2013-02-23: qty 2

## 2013-02-23 MED ORDER — FENTANYL CITRATE 0.05 MG/ML IJ SOLN
INTRAMUSCULAR | Status: DC | PRN
  Administered 2013-02-23 (×4): 25 ug via INTRAVENOUS

## 2013-02-23 MED ORDER — ONDANSETRON HCL 4 MG/2ML IJ SOLN
4.0000 mg | Freq: Once | INTRAMUSCULAR | Status: AC
  Administered 2013-02-23: 4 mg via INTRAVENOUS

## 2013-02-23 MED ORDER — MUPIROCIN 2 % EX OINT
TOPICAL_OINTMENT | CUTANEOUS | Status: AC
  Filled 2013-02-23: qty 22

## 2013-02-23 MED ORDER — LIDOCAINE HCL (PF) 1 % IJ SOLN
INTRAMUSCULAR | Status: AC
  Filled 2013-02-23: qty 5

## 2013-02-23 MED ORDER — ONDANSETRON HCL 4 MG/2ML IJ SOLN
INTRAMUSCULAR | Status: AC
  Filled 2013-02-23: qty 2

## 2013-02-23 MED ORDER — SODIUM CHLORIDE 0.9 % IV SOLN
INTRAVENOUS | Status: DC | PRN
  Administered 2013-02-23: 500 mL via INTRAMUSCULAR

## 2013-02-23 MED ORDER — PROPOFOL 10 MG/ML IV EMUL
INTRAVENOUS | Status: AC
  Filled 2013-02-23: qty 20

## 2013-02-23 MED ORDER — PROPOFOL INFUSION 10 MG/ML OPTIME
INTRAVENOUS | Status: DC | PRN
  Administered 2013-02-23: 75 ug/kg/min via INTRAVENOUS

## 2013-02-23 MED ORDER — MUPIROCIN 2 % EX OINT
TOPICAL_OINTMENT | Freq: Two times a day (BID) | CUTANEOUS | Status: DC
  Administered 2013-02-23: 13:00:00 via NASAL

## 2013-02-23 MED ORDER — ONDANSETRON HCL 4 MG/2ML IJ SOLN: 4.0000 mg | Freq: Once | INTRAMUSCULAR | Status: AC | PRN

## 2013-02-23 MED ORDER — LIDOCAINE HCL (PF) 1 % IJ SOLN
INTRAMUSCULAR | Status: AC
  Filled 2013-02-23: qty 30

## 2013-02-23 MED ORDER — CHLORHEXIDINE GLUCONATE CLOTH 2 % EX PADS
6.0000 | MEDICATED_PAD | Freq: Every day | CUTANEOUS | Status: DC
  Administered 2013-02-23: 6 via TOPICAL

## 2013-02-23 MED ORDER — HEPARIN SODIUM (PORCINE) 1000 UNIT/ML IJ SOLN
INTRAMUSCULAR | Status: DC | PRN
  Administered 2013-02-23: 3000 [IU] via INTRAVENOUS

## 2013-02-23 MED ORDER — MUPIROCIN 2 % EX OINT
1.0000 "application " | TOPICAL_OINTMENT | Freq: Two times a day (BID) | CUTANEOUS | Status: DC
  Administered 2013-02-23: 1 via NASAL
  Filled 2013-02-23: qty 22

## 2013-02-23 MED ORDER — LACTATED RINGERS IV SOLN
INTRAVENOUS | Status: DC
  Administered 2013-02-23: 14:00:00 via INTRAVENOUS

## 2013-02-23 MED ORDER — FENTANYL CITRATE 0.05 MG/ML IJ SOLN
25.0000 ug | INTRAMUSCULAR | Status: DC | PRN
  Administered 2013-02-23: 25 ug via INTRAVENOUS

## 2013-02-23 SURGICAL SUPPLY — 42 items
APPLIER CLIP 9.375 SM OPEN (CLIP)
BAG DECANTER FOR FLEXI CONT (MISCELLANEOUS) ×2 IMPLANT
BAG HAMPER (MISCELLANEOUS) ×2 IMPLANT
BENZOIN TINCTURE PRP APPL 2/3 (GAUZE/BANDAGES/DRESSINGS) ×2 IMPLANT
CATH HICKMAN DUAL 12.0 (CATHETERS) IMPLANT
CHLORAPREP W/TINT 26ML (MISCELLANEOUS) ×2 IMPLANT
CLIP APPLIE 9.375 SM OPEN (CLIP) IMPLANT
CLOTH BEACON ORANGE TIMEOUT ST (SAFETY) ×2 IMPLANT
COVER LIGHT HANDLE STERIS (MISCELLANEOUS) ×4 IMPLANT
DECANTER SPIKE VIAL GLASS SM (MISCELLANEOUS) ×2 IMPLANT
DRAPE C-ARM FOLDED MOBILE STRL (DRAPES) ×2 IMPLANT
DURAPREP 6ML APPLICATOR 50/CS (WOUND CARE) IMPLANT
ELECT REM PT RETURN 9FT ADLT (ELECTROSURGICAL) ×2
ELECTRODE REM PT RTRN 9FT ADLT (ELECTROSURGICAL) ×1 IMPLANT
GLOVE BIOGEL PI IND STRL 7.5 (GLOVE) ×1 IMPLANT
GLOVE BIOGEL PI INDICATOR 7.5 (GLOVE) ×1
GLOVE ECLIPSE 7.0 STRL STRAW (GLOVE) ×2 IMPLANT
GLOVE INDICATOR 7.5 STRL GRN (GLOVE) ×2 IMPLANT
GLOVE SKINSENSE NS SZ6.5 (GLOVE) ×1
GLOVE SKINSENSE NS SZ7.5 (GLOVE) ×1
GLOVE SKINSENSE STRL SZ6.5 (GLOVE) ×1 IMPLANT
GLOVE SKINSENSE STRL SZ7.5 (GLOVE) ×1 IMPLANT
GOWN STRL REIN XL XLG (GOWN DISPOSABLE) ×4 IMPLANT
IV NS 500ML (IV SOLUTION) ×1
IV NS 500ML BAXH (IV SOLUTION) ×1 IMPLANT
KIT PORT POWER 8FR ISP MRI (CATHETERS) ×2 IMPLANT
KIT ROOM TURNOVER APOR (KITS) ×2 IMPLANT
MANIFOLD NEPTUNE II (INSTRUMENTS) ×2 IMPLANT
NEEDLE HYPO 18GX1.5 BLUNT FILL (NEEDLE) ×2 IMPLANT
NEEDLE HYPO 25X1 1.5 SAFETY (NEEDLE) ×2 IMPLANT
NS IRRIG 1000ML POUR BTL (IV SOLUTION) ×2 IMPLANT
PACK MINOR (CUSTOM PROCEDURE TRAY) ×2 IMPLANT
PAD ARMBOARD 7.5X6 YLW CONV (MISCELLANEOUS) ×2 IMPLANT
SET BASIN LINEN APH (SET/KITS/TRAYS/PACK) ×2 IMPLANT
SET INTRODUCER 12FR PACEMAKER (SHEATH) IMPLANT
SHEATH COOK PEEL AWAY SET 8F (SHEATH) IMPLANT
STRIP CLOSURE SKIN 1/2X4 (GAUZE/BANDAGES/DRESSINGS) ×2 IMPLANT
SUT MNCRL AB 4-0 PS2 18 (SUTURE) ×2 IMPLANT
SUT VIC AB 3-0 SH 27 (SUTURE) ×1
SUT VIC AB 3-0 SH 27X BRD (SUTURE) ×1 IMPLANT
SYR CONTROL 10ML LL (SYRINGE) ×2 IMPLANT
SYRINGE 10CC LL (SYRINGE) ×2 IMPLANT

## 2013-02-23 NOTE — Progress Notes (Signed)
Dawn [name] ZOX:[id number] DOB: [birth year] DOA: 02/16/2013 PCP: Willey Blade, MD   Subjective: This lady's pain is relatively well controlled. She should be having her Port-A-Cath inserted today. I've seen the patient earlier. The plan was to send her home after the Port-A-Cath but she is also due to a blood transfusion which, unfortunately, will not occur until tomorrow.           Physical Exam: Blood pressure 128/72, pulse 101, temperature 98.4 F (36.9 C), temperature source Oral, resp. rate 21, height 5\' 3"  (1.6 m), weight 69.854 kg (154 lb), SpO2 95.00%. She looks systemically well. Heart sounds are present and normal without murmurs or added sounds. Lung fields are clear. She is alert and oriented.   Investigations:  Recent Results (from the past 240 hour(s))  CULTURE, BLOOD (ROUTINE X 2)     Status: None   Collection Time    02/16/13  9:58 PM      Result Value Range Status   Specimen Description BLOOD RIGHT HAND   Final   Special Requests BOTTLES DRAWN AEROBIC ONLY 4CC BOTTLE   Final   Culture NO GROWTH 6 DAYS   Final   Report Status 02/22/2013 FINAL   Final  CULTURE, BLOOD (ROUTINE X 2)     Status: None   Collection Time    02/16/13 10:03 PM      Result Value Range Status   Specimen Description BLOOD LEFT HAND   Final   Special Requests     Final   Value: BOTTLES DRAWN AEROBIC AND ANAEROBIC 6CC EACH BOTTLE   Culture NO GROWTH 6 DAYS   Final   Report Status 02/22/2013 FINAL   Final     Basic Metabolic Panel:  Recent Labs  81/19/14 0607 02/23/13 0605  NA 140 138  K 3.2* 3.3*  CL 106 103  CO2 28 27  GLUCOSE 81 101*  BUN 5* 6  CREATININE 0.53 0.62  CALCIUM 8.5 8.6  MG 1.8  --    Liver Function Tests: No results found for this basename: AST, ALT, ALKPHOS, BILITOT, PROT, ALBUMIN,  in the last 72 hours   CBC:  Recent Labs  02/22/13 0630 02/23/13 0605  WBC 10.0 8.1  HGB 7.7* 7.6*  HCT 21.8* 21.3*  MCV 94.0 95.9  PLT 201 191    Dg Chest 2  View  02/22/2013   *RADIOLOGY REPORT*  Clinical Data: Chest pain and shortness of breath  CHEST - 2 VIEW  Comparison: 02/17/2013  Findings: Borderline enlargement of the cardiac silhouette noted with new trace pleural effusions, central vascular congestion, and prominence of the interstitial markings.  No acute osseous finding.  IMPRESSION: Borderline cardiomegaly with findings suggesting early interstitial edema with trace effusions.   Original Report Authenticated By: Christiana Pellant, M.D.   Dg Chest Port 1 View  02/23/2013   *RADIOLOGY REPORT*  Clinical Data: Port-A-Cath insertion.  PORTABLE CHEST - 1 VIEW  Comparison: PA and lateral chest 02/22/2013.  Findings: Left subclavian Port-A-Cath is in place.  Tip of the catheter projects over the mid superior vena cava.  No pneumothorax.  Bibasilar atelectasis and small effusions appear improved.  IMPRESSION:  1.  Port-A-Cath tip projects over the superior vena cava.  Negative for pneumothorax. 2.  Improved bibasilar atelectasis and small effusions.   Original Report Authenticated By: Holley Dexter, M.D.      Medications: I have reviewed the patient's current medications.  Impression: 1. Sickle cell crisis, improved. 2. 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  3. Hypertension. 4. Status post Port-A-Cath insertion today.     Plan: 1. Get blood transfusion as planned tomorrow. 2. Discharge home tomorrow after blood transfusion.  Consultants:  Surgery, Dr. Leticia Penna.   Procedures:  Port-A-Cath insertion today. Dr. Leticia Penna.   Antibiotics:  None.                   Code Status: Full code.  Family Communication: Plan communicated to patient.   Disposition Plan: Home tomorrow.  Time spent: 20 minutes.   LOS: 7 days   Wilson Singer Pager [phone]  02/23/2013, 3:30 PM

## 2013-02-23 NOTE — Anesthesia Postprocedure Evaluation (Signed)
  Anesthesia Post-op Note  Patient: Dawn [name]  Procedure(s) Performed: Procedure(s): INSERTION PORT-A-CATH (Left)  Patient Location: PACU  Anesthesia Type:MAC  Level of Consciousness: awake, alert  and oriented  Airway and Oxygen Therapy: Patient Spontanous Breathing  Post-op Pain: mild  Post-op Assessment: Post-op Vital signs reviewed, Patient's Cardiovascular Status Stable, Respiratory Function Stable, Patent Airway and No signs of Nausea or vomiting  Post-op Vital Signs: Reviewed and stable  Complications: No apparent anesthesia complications

## 2013-02-23 NOTE — Progress Notes (Signed)
Patients IV infiltrated and had to be removed. Patient requested to wait until morning to re stick her and start new IV site.

## 2013-02-23 NOTE — Progress Notes (Signed)
UR chart review completed.  

## 2013-02-23 NOTE — Op Note (Signed)
Patient:  Dawn [name]  DOB:  September 17, 1981  MRN:  [id number]   Preop Diagnosis:  Sickle cell disorder, chronic thrombophlebitis  Postop Diagnosis:  The same  Procedure:  Left subclavian vein Port-A-Cath insertion and intraoperative fluoroscopy with surgeon interpretation  Surgeon:  Dr. Tilford Pillar  Anes:  MAC, 1% lidocaine plain for local  Indications:  Patient is a 32 year old female with a history of sickle cell disorder she presented to [facility] in a crisis. During her hospitalization it was discussed with the patient possible port placement. She had previous ports which had become infected however due to her chronic disease and chronic venous access issues he was advised she had a new port placed. She was agreeable to this. The risks benefits alternatives again were discussed with patient including but not limited to risk of bleeding, infection, pneumothorax, intraoperative cardiac and pulmonary events. Patient's questions and concerns are addressed the patient as consented for the planned procedure.  Procedure note:  Patient is taken to the operator is placed in supine position the or table time the sedation is administered. Once patient sedated her left neck and chest wall prepped with chlorhexidine solution and draped in standard fashion. Time out was performed. Patient's placed into a Trendelenburg position. Local anesthetic is instilled along the planned course of venipuncture. An 18-gauge introducer needle is then utilized to identify the left subclavian vein. Good venous return was obtained. A J-wire was advanced and was confirmed to be coursing into the superior vena cava with fluoroscopy. At this point the J-wire was clamped to the drapes with a hemostat and attention was turned to creation of the subcutaneous pocket. Again local anesthetic was instilled. A skin incision was created with a 15 blade scalpel and the pocket was enlarged with combination electrocautery and blunt  digital dissection. At this point the catheter was advanced from the pocket to the stab incision sites using a subcuticular tunneling device. The dilator insertion sheath was then passed down the J-wire. The transition of this was easy without any hesitation and was also watched using fluoroscopy. At this point the J-wire and dilator removed in standard fashion. The catheter is advanced down the insertion sheath. It is confirmed to be in the superior vena cava and then the sheath was split and removed in standard fashion. After confirming the position of the catheter, the catheter was trimmed at 18.5 cm. It is attached the port which is placed into the pocket. The port is accessed with good venous return is instilled with 3000 units of heparin. The course of the catheters and visualize with fluoroscopy there is no noted sharp angulation or kinking. At this time I did secure the port to the subcuticular pocket using a 3-0 Vicryl. The deep subcuticular tissues and reapproximated using 3-0 Vicryl. The skin edges at both incision sites were closed using a 4-0 Monocryl in a running subcuticular suture. The skin was washed dried moist dry towel. Benzoin is applied around incision. Half-inch are suture placed. The drapes removed the patient left come out of sedation. She was transferred to the PACU in stable condition. At the conclusion of procedure all instrument, sponge, needle counts are correct. Patient tolerated procedure extremely well.  Complications:  None apparent  EBL:  Minimal  Specimen:  None

## 2013-02-23 NOTE — Progress Notes (Signed)
  Subjective: Pain much improved. No chest pain. No shortness of breath. Overall feels better  Objective: Vital signs in last 24 hours: Temp:  [98.2 F (36.8 C)-99 F (37.2 C)] 98.2 F (36.8 C) (06/02 0456) Pulse Rate:  [84-98] 88 (06/02 0456) Resp:  [18] 18 (06/02 0456) BP: (100-120)/(48-78) 100/48 mmHg (06/02 0456) SpO2:  [91 %-98 %] 91 % (06/02 0456) Last BM Date: 02/20/13  Intake/Output from previous day: 06/01 0701 - 06/02 0700 In: 940 [P.O.:940] Out: -  Intake/Output this shift: Total I/O In: 120 [P.O.:120] Out: -   General appearance: alert and no distress GI: soft, non-tender; bowel sounds normal; no masses,  no organomegaly  Lab Results:   Recent Labs  02/22/13 0630 02/23/13 0605  WBC 10.0 8.1  HGB 7.7* 7.6*  HCT 21.8* 21.3*  PLT 201 191   BMET  Recent Labs  02/21/13 0607 02/23/13 0605  NA 140 138  K 3.2* 3.3*  CL 106 103  CO2 28 27  GLUCOSE 81 101*  BUN 5* 6  CREATININE 0.53 0.62  CALCIUM 8.5 8.6   PT/INR No results found for this basename: LABPROT, INR,  in the last 72 hours ABG No results found for this basename: PHART, PCO2, PO2, HCO3,  in the last 72 hours  Studies/Results: Dg Chest 2 View  02/22/2013   *RADIOLOGY REPORT*  Clinical Data: Chest pain and shortness of breath  CHEST - 2 VIEW  Comparison: 02/17/2013  Findings: Borderline enlargement of the cardiac silhouette noted with new trace pleural effusions, central vascular congestion, and prominence of the interstitial markings.  No acute osseous finding.  IMPRESSION: Borderline cardiomegaly with findings suggesting early interstitial edema with trace effusions.   Original Report Authenticated By: Christiana Pellant, M.D.    Anti-infectives: Anti-infectives   None      Assessment/Plan: s/p Procedure(s): INSERTION PORT-A-CATH (N/A) Sickle cell crisis, resolving. Chronic thrombophlebitis. We'll plan to proceed with insertion of a Port-A-Cath today. Risks benefits and alternatives  again discussed with patient. Patient will be consented for planned procedure  LOS: 7 days    Hameed Kolar C 02/23/2013

## 2013-02-23 NOTE — Transfer of Care (Signed)
Immediate Anesthesia Transfer of Care Note  Patient: Dawn [name]  Procedure(s) Performed: Procedure(s): INSERTION PORT-A-CATH (Left)  Patient Location: PACU  Anesthesia Type:MAC  Level of Consciousness: awake, alert  and oriented  Airway & Oxygen Therapy: Patient Spontanous Breathing and Patient connected to nasal cannula oxygen  Post-op Assessment: Report given to PACU RN  Post vital signs: Reviewed and stable  Complications: No apparent anesthesia complications

## 2013-02-23 NOTE — Anesthesia Preprocedure Evaluation (Signed)
Anesthesia Evaluation  Patient identified by MRN, date of birth, ID band Patient awake    Reviewed: Allergy & Precautions, H&P , NPO status , Patient's Chart, lab work & pertinent test results  Airway Mallampati: II TM Distance: >3 FB     Dental  (+) Teeth Intact   Pulmonary neg pulmonary ROS,  breath sounds clear to auscultation        Cardiovascular hypertension (PIH), Rhythm:Regular Rate:Normal     Neuro/Psych PSYCHIATRIC DISORDERS Depression    GI/Hepatic   Endo/Other    Renal/GU      Musculoskeletal   Abdominal   Peds  Hematology  (+) Blood dyscrasia, Sickle cell anemia ,   Anesthesia Other Findings   Reproductive/Obstetrics                           Anesthesia Physical Anesthesia Plan  ASA: III  Anesthesia Plan: MAC   Post-op Pain Management:    Induction: Intravenous  Airway Management Planned: Nasal Cannula  Additional Equipment:   Intra-op Plan:   Post-operative Plan:   Informed Consent: I have reviewed the patients History and Physical, chart, labs and discussed the procedure including the risks, benefits and alternatives for the proposed anesthesia with the patient or authorized representative who has indicated his/her understanding and acceptance.     Plan Discussed with:   Anesthesia Plan Comments:         Anesthesia Quick Evaluation

## 2013-02-24 MED ORDER — LORAZEPAM 0.5 MG PO TABS: 0.5000 mg | ORAL_TABLET | Freq: Two times a day (BID) | ORAL | Status: DC

## 2013-02-24 MED ORDER — OXYCODONE HCL 10 MG PO TABS: 10.0000 mg | ORAL_TABLET | ORAL | Status: DC | PRN

## 2013-02-24 NOTE — Discharge Summary (Signed)
Physician Discharge Summary  Dawn [name] JYN:[id number] DOB: [birth year] DOA: 02/16/2013  PCP: Willey Blade, MD  Admit date: 02/16/2013 Discharge date: 02/24/2013  Time spent: Greater than 30 minutes  Recommendations for Outpatient Follow-up:  1. Followup with outpatient sickle cell clinic  Discharge Diagnoses:  1. Sickle cell crisis, resolved. 2. 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 3. Hypertension. 4. Status post Port-A-Cath insertion on 02/23/2013. This was done by Dr. Leticia Penna, surgery.  Discharge Condition: Stable and improved.  Diet recommendation: Regular.  Filed Weights   02/16/13 0952 02/16/13 1659 02/24/13 0428  Weight: 69.854 kg (154 lb) 69.854 kg (154 lb) 75.524 kg (166 lb 8 oz)    History of present illness:  This pleasant 32 year old lady presented to the hospital with sickle cell crisis characterized by pain all over her body. Please see initial history as outlined below: HPI: Dawn [name] is a 32 y.o. female who has sickle cell disease and presents with pain in arms, legs, chest and back since yesterday evening. She has been taking her pain medicines as required and found that she required escalating doses of oxycodone which did not relieve her pain. She cannot find a triggering factor to cause her to have a sickle cell crisis but she appears to have one now . She denies any hemoptysis, excessive dyspnea. She is usually compliant with treatment and has not had excessive number of admissions.  Hospital Course:  Patient was admitted and given intravenous analgesia, IV fluids at a high flow rate and oxygen. She took some time to improve but did improve. Her hemoglobin decreased due to the significant hemolysis and she required 2 units of blood transfusion. Because she has an issue with IV access and the need for intravenous medications on a fairly regular basis, on this admission she had a Port-A-Cath inserted yesterday by Dr. Leticia Penna,  surgery. She still feels well today without any significant pain. She is stable for discharge. She will follow with the sickle cell clinic in [place].  Procedures:  Port-A-Cath insertion by Dr. Leticia Penna, on 02/23/2013.   Consultations:  Surgery, Dr. Leticia Penna.  Discharge Exam: Filed Vitals:   02/23/13 1545 02/23/13 2148 02/24/13 0230 02/24/13 0428  BP:  111/66 115/56 105/56  Pulse: 102 94 88 84  Temp: 98.1 F (36.7 C) 98.6 F (37 C) 98.1 F (36.7 C) 98.5 F (36.9 C)  TempSrc:  Oral Oral Oral  Resp: 21 24 20 20   Height:      Weight:    75.524 kg (166 lb 8 oz)  SpO2: 93% 97% 97% 95%    General: She looks systemically well today. She does not appear to be in pain. Cardiovascular: Heart sounds are present and normal without gallop rhythm or murmur. Respiratory: Lung fields are clear. She is alert and orientated.  Discharge Instructions  Discharge Orders   Future Orders Complete By Expires     Diet - low sodium heart healthy  As directed     Increase activity slowly  As directed         Medication List    TAKE these medications       cetirizine 10 MG tablet  Commonly known as:  ZYRTEC  Take 1 tablet (10 mg total) by mouth daily as needed. For allergies     folic acid 1 MG tablet  Commonly known as:  FOLVITE  Take 1 mg by mouth daily.     hydroxyurea 500 MG capsule  Commonly known  as:  HYDREA  Take 1-2 capsules (500-1,000 mg total) by mouth as directed. Take 500mg  (1 tablet) ON ODD DAYS OF THE MONTH AND 1000 MG (2) TABLETS ON THE EVEN DAYS.     ibuprofen 200 MG tablet  Commonly known as:  ADVIL,MOTRIN  Take 800 mg by mouth every 6 (six) hours as needed for pain.     LORazepam 0.5 MG tablet  Commonly known as:  ATIVAN  Take 1 tablet (0.5 mg total) by mouth 2 (two) times daily.     OxyCODONE 10 mg T12a  Commonly known as:  OXYCONTIN  Take 1 tablet (10 mg total) by mouth every 12 (twelve) hours.     Oxycodone HCl 10 MG Tabs  Take 1-1.5 tablets (10-15 mg  total) by mouth every 4 (four) hours as needed.     Oxycodone HCl 10 MG Tabs  Take 1 tablet (10 mg total) by mouth every 4 (four) hours as needed.     polyethylene glycol packet  Commonly known as:  MIRALAX / GLYCOLAX  Take 17 g by mouth daily as needed (constipation).     promethazine 12.5 MG tablet  Commonly known as:  PHENERGAN  Take 1 tablet (12.5 mg total) by mouth every 6 (six) hours as needed for nausea. 1-2 prn for nausea begin with 1 if no relief may repeat x's 1     Vitamin D (Ergocalciferol) 50000 UNITS Caps  Commonly known as:  DRISDOL  Take 50,000 Units by mouth every 7 (seven) days. Takes on Sundays.       Allergies  Allergen Reactions  . Desferal (Deferoxamine) Hives    Local reaction on arm only during infusion. Can take with benadryl   . Latex Other (See Comments)    REACTION: Pt experiences a burning sensation on contacted skin areas  . Lisinopril Other (See Comments) and Cough    REACTION: Sore/scratchy throat  . Tape Other (See Comments)    REACTION: Pt. Experiences a burning sensation on contacted skin areas      The results of significant diagnostics from this hospitalization (including imaging, microbiology, ancillary and laboratory) are listed below for reference.    Significant Diagnostic Studies: Dg Chest 2 View  02/22/2013   *RADIOLOGY REPORT*  Clinical Data: Chest pain and shortness of breath  CHEST - 2 VIEW  Comparison: 02/17/2013  Findings: Borderline enlargement of the cardiac silhouette noted with new trace pleural effusions, central vascular congestion, and prominence of the interstitial markings.  No acute osseous finding.  IMPRESSION: Borderline cardiomegaly with findings suggesting early interstitial edema with trace effusions.   Original Report Authenticated By: Christiana Pellant, M.D.   Dg Chest Port 1 View  02/23/2013   *RADIOLOGY REPORT*  Clinical Data: Port-A-Cath insertion.  PORTABLE CHEST - 1 VIEW  Comparison: PA and lateral chest  02/22/2013.  Findings: Left subclavian Port-A-Cath is in place.  Tip of the catheter projects over the mid superior vena cava.  No pneumothorax.  Bibasilar atelectasis and small effusions appear improved.  IMPRESSION:  1.  Port-A-Cath tip projects over the superior vena cava.  Negative for pneumothorax. 2.  Improved bibasilar atelectasis and small effusions.   Original Report Authenticated By: Holley Dexter, M.D.   Dg Chest Port 1 View  02/17/2013   *RADIOLOGY REPORT*  Clinical Data: History of sickle cell disease.  Evaluation for hypoxia and lung pathology.  PORTABLE CHEST - 1 VIEW  Comparison: 02/16/2013.  Findings: There is borderline cardiac silhouette enlargement.  No mediastinal or hilar lesion  is identified.  There is slight upper lobe vascular prominence.  No consolidation or pleural effusion is evident.  There is minimal subsegmental atelectasis in the left lung base.  No skeletal lesion is evident.  IMPRESSION: Borderline cardiac silhouette enlargement.  Slight upper lobe vascular prominence.  No pulmonary edema, consolidation, or pleural effusion.  Minimal left basilar subsegmental atelectasis.   Original Report Authenticated By: Onalee Hua Call   Dg Chest Port 1 View  02/16/2013   *RADIOLOGY REPORT*  Clinical Data: Short of breath  PORTABLE CHEST - 1 VIEW  Comparison: 12/05/2012  Findings: Heart size is normal.  Mediastinal shadows are normal. The vascularity is normal.  Lungs are clear.  No a few.  Previously seen PICC catheter has been removed.  IMPRESSION: No active disease.   Original Report Authenticated By: Paulina Fusi, M.D.   Dg C-arm 1-60 Min-no Report  02/23/2013   CLINICAL DATA: chronic thrombophlebitis   C-ARM 1-60 MINUTES  Fluoroscopy was utilized by the requesting physician.  No radiographic  interpretation.     Microbiology: Recent Results (from the past 240 hour(s))  CULTURE, BLOOD (ROUTINE X 2)     Status: None   Collection Time    02/16/13  9:58 PM      Result Value Range  Status   Specimen Description BLOOD RIGHT HAND   Final   Special Requests BOTTLES DRAWN AEROBIC ONLY 4CC BOTTLE   Final   Culture NO GROWTH 6 DAYS   Final   Report Status 02/22/2013 FINAL   Final  CULTURE, BLOOD (ROUTINE X 2)     Status: None   Collection Time    02/16/13 10:03 PM      Result Value Range Status   Specimen Description BLOOD LEFT HAND   Final   Special Requests     Final   Value: BOTTLES DRAWN AEROBIC AND ANAEROBIC 6CC EACH BOTTLE   Culture NO GROWTH 6 DAYS   Final   Report Status 02/22/2013 FINAL   Final  SURGICAL PCR SCREEN     Status: Abnormal   Collection Time    02/23/13  1:45 PM      Result Value Range Status   MRSA, PCR NEGATIVE  NEGATIVE Final   Staphylococcus aureus POSITIVE (*) NEGATIVE Final   Comment:            The Xpert SA Assay (FDA     approved for NASAL specimens     in patients over 17 years of age),     is one component of     a comprehensive surveillance     program.  Test performance has     been validated by [employer] for patients greater     than or equal to 34 year old.     It is not intended     to diagnose infection nor to     guide or monitor treatment.     RESULT CALLED TO, READ BACK BY AND VERIFIED WITH:     WRIGHT A AT 2012 ON [id number] BY FORSYTH K     Labs: Basic Metabolic Panel:  Recent Labs Lab 02/18/13 0833 02/19/13 0452 02/20/13 0457 02/21/13 0607 02/23/13 0605  NA 140 140 140 140 138  K 3.4* 4.2 3.2* 3.2* 3.3*  CL 108 108 105 106 103  CO2 26 26 27 28 27   GLUCOSE 93 84 88 81 101*  BUN 6 7 6  5* 6  CREATININE 0.51 0.62 0.60 0.53 0.62  CALCIUM 8.3* 8.5 8.2* 8.5 8.6  MG  --   --   --  1.8  --    Liver Function Tests:  Recent Labs Lab 02/18/13 0833 02/19/13 0452 02/20/13 0457  AST 95* 106* 75*  ALT 35 37* 28  ALKPHOS 199* 197* 169*  BILITOT 10.3* 9.8* 5.9*  PROT 6.5 6.8 6.2  ALBUMIN 2.5* 2.6* 2.3*     CBC:  Recent Labs Lab 02/19/13 0452 02/20/13 0457 02/21/13 0607 02/22/13 0630  02/23/13 0605  WBC 10.2 9.1 7.0 10.0 8.1  HGB 8.3* 7.8* 7.3* 7.7* 7.6*  HCT 22.5* 20.8* 20.8* 21.8* 21.3*  MCV 91.1 90.8 92.9 94.0 95.9  PLT 148* 157 166 201 191    BNP: BNP (last 3 results)  Recent Labs  03/25/12 0847  PROBNP 255.2*        Signed:  GOSRANI,NIMISH C  [facility] 02/24/2013, 7:49 AM

## 2013-02-24 NOTE — Progress Notes (Signed)
Patient left in stable condition with all questions answered. Discharge instructions given. Asked patient when she would like a f/u appointment and patient requested to make appointment when she got home. Patient taken out of facility by staff via wheelchair.

## 2013-02-24 NOTE — Anesthesia Postprocedure Evaluation (Signed)
  Anesthesia Post-op Note  Patient: Dawn [name]  Procedure(s) Performed: Procedure(s): INSERTION PORT-A-CATH (Left)  Patient Location: PACU  Anesthesia Type:MAC  Level of Consciousness: awake, alert , oriented and patient cooperative  Airway and Oxygen Therapy: Patient Spontanous Breathing  Post-op Pain: mild  Post-op Assessment: Post-op Vital signs reviewed, Patient's Cardiovascular Status Stable, Respiratory Function Stable, Patent Airway, Adequate PO intake and Pain level controlled  Post-op Vital Signs: Reviewed and stable  Complications: No apparent anesthesia complications

## 2013-02-24 NOTE — Care Management Note (Signed)
    Page 1 of 1   02/24/2013     3:50:02 PM   CARE MANAGEMENT NOTE 02/24/2013  Patient:  Dawn [name],Dawn [name]   Account Number:  [id number]  Date Initiated:  02/24/2013  Documentation initiated by:  Sharrie Rothman  Subjective/Objective Assessment:   Pt admitted with sickle cell crisis. Pt lives with her children and will return home at discharge.     Action/Plan:   No CM needs noted.   Anticipated DC Date:  02/24/2013   Anticipated DC Plan:  HOME/SELF CARE      DC Planning Services  CM consult      Choice offered to / List presented to:             Status of service:  Completed, signed off Medicare Important Message given?   (If response is "NO", the following Medicare IM given date fields will be blank) Date Medicare IM given:   Date Additional Medicare IM given:    Discharge Disposition:  HOME/SELF CARE  Per UR Regulation:    If discussed at Long Length of Stay Meetings, dates discussed:   02/24/2013    Comments:  02/24/13 1550 Arlyss Queen, RN BSN CM

## 2013-02-25 ENCOUNTER — Encounter (HOSPITAL_COMMUNITY): Payer: Self-pay | Admitting: General Surgery

## 2013-02-27 LAB — TYPE AND SCREEN
ABO/RH(D): A POS
Antibody Screen: POSITIVE
Unit division: 0

## 2013-03-02 ENCOUNTER — Ambulatory Visit (INDEPENDENT_AMBULATORY_CARE_PROVIDER_SITE_OTHER): Payer: Medicare Other | Admitting: Internal Medicine

## 2013-03-02 ENCOUNTER — Ambulatory Visit (HOSPITAL_COMMUNITY)
Admission: AD | Admit: 2013-03-02 | Discharge: 2013-03-02 | Disposition: A | Discharge status: Home / Self Care | Payer: Medicare Other | Source: Ambulatory Visit | Attending: Internal Medicine | Admitting: Internal Medicine

## 2013-03-02 ENCOUNTER — Encounter: Payer: Self-pay | Admitting: Internal Medicine

## 2013-03-02 VITALS — BP 133/77 | HR 102 | Temp 99.2°F | Ht 63.0 in | Wt 154.0 lb

## 2013-03-02 DIAGNOSIS — D571 Sickle-cell disease without crisis: Secondary | ICD-10-CM

## 2013-03-02 DIAGNOSIS — E559 Vitamin D deficiency, unspecified: Secondary | ICD-10-CM

## 2013-03-02 LAB — COMPREHENSIVE METABOLIC PANEL
ALT: 31 U/L (ref 0–35)
AST: 59 U/L — ABNORMAL HIGH (ref 0–37)
Albumin: 3.2 g/dL — ABNORMAL LOW (ref 3.5–5.2)
Alkaline Phosphatase: 233 U/L — ABNORMAL HIGH (ref 39–117)
CO2: 26 mEq/L (ref 19–32)
Chloride: 102 mEq/L (ref 96–112)
GFR calc non Af Amer: >90 mL/min (ref >90)
Potassium: 3.4 mEq/L — ABNORMAL LOW (ref 3.5–5.1)
Sodium: 136 mEq/L (ref 135–145)
Total Bilirubin: 3.9 mg/dL — ABNORMAL HIGH (ref 0.3–1.2)

## 2013-03-02 LAB — CBC WITH DIFFERENTIAL/PLATELET
Band Neutrophils: 4 % (ref 0–10)
Blasts: 0 %
HCT: 24.6 % — ABNORMAL LOW (ref 36.0–46.0)
Lymphocytes Relative: 32 % (ref 12–46)
Lymphs Abs: 3.5 10*3/uL (ref 0.7–4.0)
Monocytes Absolute: 0.2 10*3/uL (ref 0.1–1.0)
Monocytes Relative: 2 % — ABNORMAL LOW (ref 3–12)
Neutro Abs: 7.3 10*3/uL (ref 1.7–7.7)
Neutrophils Relative %: 62 % (ref 43–77)
Promyelocytes Absolute: 0 %
RDW: 20.2 % — ABNORMAL HIGH (ref 11.5–15.5)
WBC: 11 10*3/uL — ABNORMAL HIGH (ref 4.0–10.5)
nRBC: 0 /100 WBC

## 2013-03-02 LAB — LACTATE DEHYDROGENASE: LDH: 460 U/L — ABNORMAL HIGH (ref 94–250)

## 2013-03-02 MED ORDER — OXYCODONE HCL 10 MG PO TABS: 10.0000 mg | ORAL_TABLET | ORAL | Status: DC | PRN

## 2013-03-02 MED ORDER — VITAMIN D (ERGOCALCIFEROL) 1.25 MG (50000 UNIT) PO CAPS: 50000.0000 [IU] | ORAL_CAPSULE | ORAL | Status: DC

## 2013-03-02 MED ORDER — SODIUM CHLORIDE 0.9 % IJ SOLN
10.0000 mL | INTRAMUSCULAR | Status: AC | PRN
  Administered 2013-03-02: 10 mL

## 2013-03-02 MED ORDER — HEPARIN SOD (PORK) LOCK FLUSH 100 UNIT/ML IV SOLN
500.0000 [IU] | INTRAVENOUS | Status: AC | PRN
  Administered 2013-03-02: 500 [IU]

## 2013-03-02 NOTE — Progress Notes (Signed)
Subjective:     Patient ID: Dawn [name], female   DOB: 04-11-1981, 32 y.o.   MRN: [id number]  HPI: Pt known to me from previous hospitalizations her for post-hospital follow-up and to establish care. Pt states that she has crises about 3-4 x year and is usually able to control episodic pain with oral analgesics ranging from Ibuprofen to Oxycodone dpendending on the intensity of pain. Pt states that she has been feeling well since discharge. She has been without Hydrea for about 2 months but has been compliant with Folic acid. She has not yet been able to receive the Exjade for Iron chelation.   Review of Systems  Constitutional: Negative.   HENT: Negative.   Eyes: Negative.   Respiratory: Negative.   Cardiovascular: Negative.   Gastrointestinal: Negative.   Endocrine: Negative.   Genitourinary: Negative.   Musculoskeletal: Negative.   Skin: Negative.   Allergic/Immunologic: Negative.   Neurological: Negative.   Hematological: Negative.   Psychiatric/Behavioral: Negative.        Objective:   Physical Exam  Constitutional: She is oriented to person, place, and time. She appears well-developed and well-nourished.  HENT:  Head: Normocephalic and atraumatic.  Eyes: Conjunctivae and EOM are normal. Pupils are equal, round, and reactive to light. Scleral icterus (mild icterus) is present.  Neck: Normal range of motion. Neck supple. No JVD present. No thyromegaly present.  Cardiovascular: Normal rate and regular rhythm.  Exam reveals no gallop and no friction rub.   No murmur heard. Pulmonary/Chest: Effort normal and breath sounds normal. She has no wheezes. She has no rales.  Abdominal: Soft. Bowel sounds are normal. She exhibits no distension and no mass. There is no tenderness.  Musculoskeletal: Normal range of motion.  Lymphadenopathy:    She has no cervical adenopathy.  Neurological: She is alert and oriented to person, place, and time. No cranial nerve deficit.  Skin: Skin is warm  and dry.  Psychiatric: She has a normal mood and affect. Her behavior is normal. Judgment and thought content normal.       Assessment:    1. Hb SS: Pt has been without Hydrea for 2 months. Was previously on 500 mg and 1000 mg QOD. Will resume previous dose today based on labs reviewed from last week at end of hospitalization. Pt has had opthalmology evaluation last week she reports no retinopathy. Ferritin levels >7000 however this was during an acute episode of crisis.   2. Chronic -Intermittent pain: Associated with Hb SS. Pt has been taking Oxycontin prescribed on a PRN basis an was prescribed Oxycodone on a range of 10-15 mg by her previous provider. I have had a long discussion with patient about the risks and benefits of taking her medications in that manner. She is a very compliant patient and states that she only in frequently has pain requiring prolonged use of opiates. She verbalizes understanding and wants to do whatever is best for her although she sees her Oxycontin as a security blanket to adequately treat her pain when an acute episode begins.   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 Vitamin D Deficiency: No signs or symptoms of complications.    Plan:     1. Hb SS: Pt to check CBC with diff 1 week after resuming Hydrea. Continue Folate, Obtain ECHO records from [facility]. Check Ferritin, CBC wit diff, CMET, LDH, Reticulocyte today.  2. Chronic Pain: Pt is prescribed Oxycodone 10 mg to be taken q 4 hours  PRN. She is given a prescription for 90 pills to accommodate for the need for increased dosing to initiate an acute episode of VOC. I will not resume her Oxycontin and she will call us before the need to take OxyContin or increase Immediate release Oxycodone.  3. Transfusional Hemachromatosis: case manager to see patient during today's visit to assist with obtaining Exjade.  4. Vitamin D deficiency: Continue Vitamin D supplements and check Vitamin D  levels today.    Labs: Ferritin, CBC wit diff, CMET, LDH, Reticulocyte and Vitamin D. Records: Obtain ECHO results from [facility].  RTC: 1 month.  Greater than 50% of visit spent in counseling and discussion of chronic care and complications of Sickle cell disease.

## 2013-03-02 NOTE — Progress Notes (Signed)
Patient states PAC placed Monday. Verified with [name], ok to use [facility] for blood draw.

## 2013-03-02 NOTE — Progress Notes (Signed)
Diagnosis:  Sickle Cell  Port accessed, labs drawn per order, port de accessed and flushed per protocol.

## 2013-03-02 NOTE — Progress Notes (Signed)
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 and [facility]. Dawn [name] stated she did not have any needs today. This CM advised if she doesn't understand or has questions regarding her Exjade and iron overload to contact this CM.    Barriers to Care: Needs Exjade  Prior Approval (PA) #:NA PA start date: NA PA end date:  NA  Action/Plan: This CM initiated Exjade process with specialty pharmacy. This CM provided Dawn [name] with education on hemochromatosis and Exjade. This CM also provided Dawn [name] with a mixing cup for the Exjade. This CM will fax provider name change to [facility]. This CM gave Dawn [name] contact information to [facility].  Time spent on case management: 30 mins   Karoline Caldwell RN, BSN, [place]    [phone]

## 2013-03-03 LAB — FERRITIN: Ferritin: 1863 ng/mL — ABNORMAL HIGH (ref 10–291)

## 2013-03-04 ENCOUNTER — Telehealth: Payer: Self-pay | Admitting: *Deleted

## 2013-03-04 ENCOUNTER — Other Ambulatory Visit: Payer: Self-pay | Admitting: Internal Medicine

## 2013-03-04 DIAGNOSIS — D571 Sickle-cell disease without crisis: Secondary | ICD-10-CM

## 2013-03-04 MED ORDER — HYDROXYUREA 500 MG PO CAPS: 500.0000 mg | ORAL_CAPSULE | ORAL | Status: DC

## 2013-03-04 NOTE — Telephone Encounter (Signed)
Per [name] she has reviewed pts lab work and pt is to resume her Hydrea, per [name] she has already sent Rx into pts pharmacy.  LMOV advising pt of this.

## 2013-03-04 NOTE — Progress Notes (Signed)
Labs reviewed. Pt has elevated ferritin and should continue Exjade. Authorization in process. Indices reviewed and patient resumed on Hydroxyurea at previous dose.

## 2013-03-05 ENCOUNTER — Telehealth: Payer: Self-pay | Admitting: *Deleted

## 2013-03-05 NOTE — Telephone Encounter (Signed)
Pt states she checked w/ her [facility] pharmacy in [place] and they did not yet have her RX for Hydrea that [name] said she sent in.  Upon looking in pts chart it appears the Rx was actually printed instead of e-prescribed, pt lives out of town and would like Rx to be called in.  I have called in Hydroxyurea 500 mg capsules, take 1-2 capsules PO as directed, take 500 mg on odd days of the month and 1000 mg on even days, Qty:46 w/ 2 RF's.

## 2013-03-09 ENCOUNTER — Other Ambulatory Visit: Payer: Self-pay | Admitting: Internal Medicine

## 2013-03-13 ENCOUNTER — Telehealth: Payer: Self-pay

## 2013-03-13 NOTE — Telephone Encounter (Signed)
This CM spoke with Ms.Staat regarding receivng her Exjade. Ms.Pillay stated she received her Exjade yesterday 03/12/13 and did not have any questions or concerns at this time. This CM provided her contact information for the future.   Karoline Caldwell RN, BSN, [place]  [phone]

## 2013-04-01 ENCOUNTER — Ambulatory Visit (INDEPENDENT_AMBULATORY_CARE_PROVIDER_SITE_OTHER): Payer: Medicare Other | Admitting: Internal Medicine

## 2013-04-01 ENCOUNTER — Encounter: Payer: Self-pay | Admitting: Internal Medicine

## 2013-04-01 ENCOUNTER — Encounter (HOSPITAL_COMMUNITY): Payer: Self-pay | Admitting: *Deleted

## 2013-04-01 ENCOUNTER — Non-Acute Institutional Stay (HOSPITAL_COMMUNITY)
Admission: AD | Admit: 2013-04-01 | Discharge: 2013-04-01 | Disposition: A | Discharge status: Home / Self Care | Payer: Medicare Other | Source: Ambulatory Visit | Attending: Internal Medicine | Admitting: Internal Medicine

## 2013-04-01 VITALS — BP 118/76 | HR 84 | Temp 98.9°F | Resp 15 | Ht 63.0 in | Wt 154.0 lb

## 2013-04-01 DIAGNOSIS — M79609 Pain in unspecified limb: Secondary | ICD-10-CM | POA: Insufficient documentation

## 2013-04-01 DIAGNOSIS — Z79899 Other long term (current) drug therapy: Secondary | ICD-10-CM | POA: Insufficient documentation

## 2013-04-01 DIAGNOSIS — D5700 Hb-SS disease with crisis, unspecified: Secondary | ICD-10-CM

## 2013-04-01 DIAGNOSIS — G8929 Other chronic pain: Secondary | ICD-10-CM | POA: Diagnosis not present

## 2013-04-01 DIAGNOSIS — R52 Pain, unspecified: Secondary | ICD-10-CM | POA: Diagnosis not present

## 2013-04-01 DIAGNOSIS — D57819 Other sickle-cell disorders with crisis, unspecified: Secondary | ICD-10-CM | POA: Diagnosis not present

## 2013-04-01 DIAGNOSIS — F329 Major depressive disorder, single episode, unspecified: Secondary | ICD-10-CM

## 2013-04-01 DIAGNOSIS — R112 Nausea with vomiting, unspecified: Secondary | ICD-10-CM | POA: Insufficient documentation

## 2013-04-01 DIAGNOSIS — F4321 Adjustment disorder with depressed mood: Secondary | ICD-10-CM

## 2013-04-01 LAB — CBC WITH DIFFERENTIAL/PLATELET
Basophils Absolute: 0.1 10*3/uL (ref 0.0–0.1)
Basophils Relative: 1 % (ref 0–1)
Eosinophils Absolute: 0.1 10*3/uL (ref 0.0–0.7)
HCT: 24 % — ABNORMAL LOW (ref 36.0–46.0)
Hemoglobin: 8.6 g/dL — ABNORMAL LOW (ref 12.0–15.0)
Lymphocytes Relative: 37 % (ref 12–46)
Lymphs Abs: 2.4 10*3/uL (ref 0.7–4.0)
MCH: 35.1 pg — ABNORMAL HIGH (ref 26.0–34.0)
MCHC: 35.8 g/dL (ref 30.0–36.0)
MCV: 98 fL (ref 78.0–100.0)
Monocytes Absolute: 0.9 10*3/uL (ref 0.1–1.0)
Neutro Abs: 3.1 10*3/uL (ref 1.7–7.7)
RDW: 22.7 % — ABNORMAL HIGH (ref 11.5–15.5)

## 2013-04-01 LAB — COMPREHENSIVE METABOLIC PANEL
ALT: 85 U/L — ABNORMAL HIGH (ref 0–35)
BUN: 5 mg/dL — ABNORMAL LOW (ref 6–23)
CO2: 25 mEq/L (ref 19–32)
Calcium: 9.1 mg/dL (ref 8.4–10.5)
Creatinine, Ser: 0.5 mg/dL (ref 0.50–1.10)
GFR calc Af Amer: >90 mL/min (ref >90)
GFR calc non Af Amer: >90 mL/min (ref >90)
Glucose, Bld: 80 mg/dL (ref 70–99)
Sodium: 137 mEq/L (ref 135–145)
Total Protein: 8.1 g/dL (ref 6.0–8.3)

## 2013-04-01 LAB — RETICULOCYTES
Retic Count, Absolute: 629.4 10*3/uL — ABNORMAL HIGH (ref 19.0–186.0)
Retic Ct Pct: 25.9 % — ABNORMAL HIGH (ref 0.4–3.1)

## 2013-04-01 MED ORDER — PROMETHAZINE HCL 25 MG/ML IJ SOLN: 12.5000 mg | Freq: Once | INTRAMUSCULAR | Status: AC

## 2013-04-01 MED ORDER — HEPARIN SOD (PORK) LOCK FLUSH 100 UNIT/ML IV SOLN
500.0000 [IU] | INTRAVENOUS | Status: AC | PRN
  Administered 2013-04-01: 500 [IU]
  Filled 2013-04-01: qty 5

## 2013-04-01 MED ORDER — HYDROMORPHONE HCL PF 2 MG/ML IJ SOLN
3.0000 mg | Freq: Once | INTRAMUSCULAR | Status: AC
  Administered 2013-04-01: 3 mg via INTRAVENOUS
  Filled 2013-04-01: qty 2

## 2013-04-01 MED ORDER — SODIUM CHLORIDE 0.9 % IJ SOLN
10.0000 mL | INTRAMUSCULAR | Status: AC | PRN
  Administered 2013-04-01: 10 mL

## 2013-04-01 MED ORDER — OXYCODONE HCL ER 10 MG PO T12A
10.0000 mg | EXTENDED_RELEASE_TABLET | Freq: Two times a day (BID) | ORAL | Status: DC
  Administered 2013-04-01: 10 mg via ORAL
  Filled 2013-04-01: qty 1

## 2013-04-01 MED ORDER — DIPHENHYDRAMINE HCL 25 MG PO CAPS: 25.0000 mg | ORAL_CAPSULE | Freq: Once | ORAL | Status: DC

## 2013-04-01 MED ORDER — DEXTROSE-NACL 5-0.45 % IV SOLN
INTRAVENOUS | Status: DC
  Administered 2013-04-01: 17:00:00 via INTRAVENOUS

## 2013-04-01 MED ORDER — OXYCODONE HCL 10 MG PO TABS: 10.0000 mg | ORAL_TABLET | ORAL | Status: DC | PRN

## 2013-04-01 MED ORDER — PROMETHAZINE HCL 25 MG PO TABS
25.0000 mg | ORAL_TABLET | Freq: Once | ORAL | Status: AC
  Administered 2013-04-01: 25 mg via ORAL
  Filled 2013-04-01: qty 1

## 2013-04-01 MED ORDER — HYDROMORPHONE HCL PF 2 MG/ML IJ SOLN
2.0000 mg | INTRAMUSCULAR | Status: DC | PRN
  Administered 2013-04-01: 3 mg via INTRAVENOUS
  Filled 2013-04-01: qty 2

## 2013-04-01 MED ORDER — FOLIC ACID 1 MG PO TABS: 1.0000 mg | ORAL_TABLET | Freq: Every day | ORAL | Status: DC

## 2013-04-01 NOTE — Progress Notes (Signed)
Subjective:    Patient ID: Dawn [name], female    DOB: 1980-11-04, 32 y.o.   MRN: [id number]  HPI: Patient presents to clinic today with complaints of pain and to followup with her sickle cell disease. She states that her pain currently is about 7-8/10. It is localized to her back and legs. She states the pain is typical of her sickle cell crisis. At home she increased her short acting oxycodone and she has not been taking OxyContin since last visit. She states that she has been doing pretty well but feels that her symptoms may have been triggered by the changes in weather over the weekend and particularly increased activity over the Fourth of July holiday weekend. The patient had some short acting oxycodone left over from her last hospitalization in June. Please note that she had a prescription written for her on her last office visit however the prescription was never filled.  Today she appears pleasant but clearly in some distress secondary to pain when she is attempting to mass with a smile. She denies any fever, chills, nausea, vomiting or diarrhea.    Review of Systems  Constitutional: Negative.   HENT: Negative.   Eyes: Negative.   Respiratory: Negative.   Cardiovascular: Negative.   Gastrointestinal: Negative.   Endocrine: Negative.   Genitourinary: Negative.   Musculoskeletal: Positive for myalgias, back pain and arthralgias.  Skin: Negative.   Allergic/Immunologic: Negative.   Neurological: Negative.   Hematological: Negative.   Psychiatric/Behavioral: Negative.        Objective:   Physical Exam  Constitutional: She is oriented to person, place, and time. She appears well-developed and well-nourished. She appears distressed.  HENT:  Head: Normocephalic and atraumatic.  Eyes: Conjunctivae and EOM are normal. Pupils are equal, round, and reactive to light. No scleral icterus (Mild icterus).  Neck: Normal range of motion. Neck supple. No JVD present. No thyromegaly present.   Cardiovascular: Regular rhythm and normal heart sounds.  Exam reveals no gallop and no friction rub.   No murmur heard. Mild tachycardia on examination  Pulmonary/Chest: Effort normal and breath sounds normal. She has no wheezes. She has no rales.  Abdominal: Soft. Bowel sounds are normal. She exhibits no distension and no mass. There is no tenderness.  Musculoskeletal: Normal range of motion.  Lymphadenopathy:    She has no cervical adenopathy.  Neurological: She is alert and oriented to person, place, and time. No cranial nerve deficit.  Skin: Skin is warm and dry.  Psychiatric: She has a normal mood and affect. Her behavior is normal. Judgment and thought content normal.          Assessment & Plan:  1. Hb SS with early vaso-occlusive crisis: The patient is clearly an early vaso-occlusive episode. Presently she has no oral medications. I will transition the patient to the hospital for hydration and she does appear to have some mild dehydration. I will also check her CBC with diff, CMET and reticulocytes. I will treat her with gentle IV hydration and IV analgesics. Once her renal function is estimated to be within normal limits then we will add Toradol for anti-inflammatory effects. Although the patient is not on chronic long-acting medications I will give her a short course of OxyContin for 3 days to get her through the crisis. And then she will continued on her oxycodone on a scheduled basis until she is able to take when necessary basis.  The patient will continue on her hydroxyurea and folic acid on this  her indices dictated differently. Indices will be evaluated once she has had her labs drawn at the day hospital.

## 2013-04-01 NOTE — H&P (Signed)
[facility] History and Physical   Date: 04/01/2013  Patient name: Dawn [name] Medical record number: [id number] Date of birth: 1981-05-03 Age: 32 y.o. Gender: female PCP: August Saucer ERIC, MD  Attending physician: Altha Harm, MD  Chief Complaint: bilateral arm and leg pain    History of Present Illness: [name] is a 32 years old [name] genotype Hemoglobin SS. PMH:of depression. Today she was in for an office visit with her PCP and after evaluation it was determined she needed treatment for painful crisis and hydration. She has not been feeling well for this entire week this is a yearly stressful depressive week; in which her son past 03/26/07 and his birthday would have been 03/30[birth year]06. This brings painful but special memories. She rates her pain as 6/10 and her base line is is pain free. Provacative factors is stressful events and increase temperatures with humidity. Pallative factors are heat to the affected areas.   [name] gave instructions on how to take her analgesic medication for the next few days than prn re emphasised. Denies chest pain, shortness of breath, n/v, dysuria, and constipation. She has had infrequent episodes of diarrhea which she contributes to taking her exjade.  Meds: Prescriptions prior to admission  Medication Sig Dispense Refill  . cetirizine (ZYRTEC) 10 MG tablet Take 1 tablet (10 mg total) by mouth daily as needed. For allergies  30 tablet  2  . deferasirox (EXJADE) 500 MG disintegrating tablet Take 1,500 mg by mouth daily before breakfast.      . folic acid (FOLVITE) 1 MG tablet Take 1 mg by mouth daily.        . hydroxyurea (HYDREA) 500 MG capsule Take 1-2 capsules (500-1,000 mg total) by mouth as directed. Take 500mg  (1 tablet) ON ODD DAYS OF THE MONTH AND 1000 MG (2) TABLETS ON THE EVEN DAYS.  46 capsule  2  . oxyCODONE 10 MG TABS Take 1 tablet (10 mg total) by mouth every 4 (four) hours as needed.  30 tablet  0  . Oxycodone  HCl 10 MG TABS Take 1 tablet (10 mg total) by mouth every 4 (four) hours as needed (severe pain).  90 tablet  0  . promethazine (PHENERGAN) 12.5 MG tablet Take 1 tablet (12.5 mg total) by mouth every 6 (six) hours as needed for nausea. 1-2 prn for nausea begin with 1 if no relief may repeat x's 1  30 tablet  0  . Vitamin D, Ergocalciferol, (DRISDOL) 50000 UNITS CAPS Take 1 capsule (50,000 Units total) by mouth every 7 (seven) days. Takes on Sundays.  30 capsule  11  . ibuprofen (ADVIL,MOTRIN) 200 MG tablet Take 800 mg by mouth every 6 (six) hours as needed for pain.      . OxyCODONE (OXYCONTIN) 10 mg T12A Take 1 tablet (10 mg total) by mouth every 12 (twelve) hours.  60 tablet  0  . polyethylene glycol (MIRALAX / GLYCOLAX) packet Take 17 g by mouth daily as needed (constipation).        Allergies: Desferal; Latex; Lisinopril; and Tape Past Medical History  Diagnosis Date  . Sickle cell disease   . Sickle cell disease, type S   . Blood transfusion   . 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 Cholecystectomy    .  left knee acl reconstruction    . Cesarean section      x 2  . Portacath placement    .  Portacath placement Left 02/23/2013    Procedure: INSERTION PORT-A-CATH;  Surgeon: Fabio Bering, MD;  Location: [facility];  Service: General;  Laterality: Left;   Family History  Problem Relation Age of Onset  . Sickle cell trait Mother   . Sickle cell trait Father   . Hypertension Father   . Diabetes Father   . Sickle cell trait Sister   . Cancer Paternal Aunt     Breast   History   Social History  . Marital Status: Single    Spouse Name: N/A    Number of Children: N/A  . Years of Education: N/A   Occupational History  . Not on file.   Social History Main Topics  . Smoking status: Never Smoker   . Smokeless tobacco: Not on file  . Alcohol Use: No  . Drug Use: No  . Sexually Active: Yes    Birth Control/ Protection:  IUD   Other Topics Concern  . Not on file   Social History Narrative  . No narrative on file    Review of Systems: A comprehensive review of systems was negative except for: Gastrointestinal: positive for diarrhea Musculoskeletal: positive for bilateral arm and leg pain   Physical Exam: Blood pressure 119/64, pulse 86, temperature 98.7 F (37.1 C), temperature source Oral, resp. rate 18, SpO2 95.00%. BP 119/64  Pulse 90  Temp(Src) 98.7 F (37.1 C) (Oral)  Resp 18  SpO2 96%  General Appearance:    Alert, cooperative, no distress, appears stated age  Head:    Normocephalic, without obvious abnormality, atraumatic  Eyes:    PERRL, conjunctiva/corneas clear, EOM's intact, mild icteric sclera   Ears:    Normal TM's and external ear canals, both ears  Nose:   Nares normal, septum midline, mucosa normal, no drainage    or sinus tenderness  Throat:   Lips, mucosa, and tongue normal; teeth and gums normal  Neck:   Supple, symmetrical, trachea midline, no adenopathy;    thyroid:  no carotid bruit or JVD  Back:     Symmetric, no curvature, ROM normal, no CVA tenderness  Lungs:     Clear to auscultation bilaterally, no vocal fremitus   Chest Wall:    No tenderness or deformity   Heart:    Regular rate and rhythm, S1 and S2 normal, no murmur, rub   or gallop  Breast Exam:    Deferred   Abdomen:     Soft, non-tender, bowel sounds active all four quadrants,    no masses, no organomegaly  Genitalia:  deferred  Rectal:  deferred  Extremities:   Extremities normal, atraumatic, no cyanosis or edema  Pulses:   2+ and symmetric all extremities  Skin:   Skin color, texture, turgor normal, no rashes or lesions  Lymph nodes:   Cervical, supraclavicular, and axillary nodes normal  Neurologic:   CNII-XII intact, normal strength     Lab results: No results found for this or any previous visit (from the past 24 hour(s)).  Imaging results:  No results found.   Assessment & Plan: 1.  Depression/situational stressors: yearly episodic depression secondary to death of her son  2. Vaso occlusive painful crisis: tx with gentle hydration IVF, scheduled analgesic and antipruitic. Adjunct therapy Toradol for inflammatory/reactive process. Longacting analgesic resumed.   EDWARDS, MICHELLE P 04/01/2013, 5:09 PM

## 2013-04-01 NOTE — Discharge Summary (Signed)
[facility] Discharge Summary   Patient ID: Dawn [name] MRN: [id number] DOB/AGE: 32-28-1982 32 y.o.  Admit date: 04/01/2013 Discharge date: 04/01/2013  Primary Care Physician:  Marthann Schiller, MD  Admission Diagnoses:  Active Problems: Vaso occlusive crisis with hemolysis  Depression Acute on chronic pain   Discharge Diagnoses:   Vaso occlusive crisis with hemolysis  Depression Acute on chronic pain  Discharge Medications:    Medication List    ASK your doctor about these medications       cetirizine 10 MG tablet  Commonly known as:  ZYRTEC  Take 1 tablet (10 mg total) by mouth daily as needed. For allergies     deferasirox 500 MG disintegrating tablet  Commonly known as:  EXJADE  Take 1,500 mg by mouth daily before breakfast.     folic acid 1 MG tablet  Commonly known as:  FOLVITE  Take 1 mg by mouth daily.     hydroxyurea 500 MG capsule  Commonly known as:  HYDREA  Take 1-2 capsules (500-1,000 mg total) by mouth as directed. Take 500mg  (1 tablet) ON ODD DAYS OF THE MONTH AND 1000 MG (2) TABLETS ON THE EVEN DAYS.     ibuprofen 200 MG tablet  Commonly known as:  ADVIL,MOTRIN  Take 800 mg by mouth every 6 (six) hours as needed for pain.     OxyCODONE 10 mg T12a  Commonly known as:  OXYCONTIN  Take 1 tablet (10 mg total) by mouth every 12 (twelve) hours.     Oxycodone HCl 10 MG Tabs  Take 1 tablet (10 mg total) by mouth every 4 (four) hours as needed.     polyethylene glycol packet  Commonly known as:  MIRALAX / GLYCOLAX  Take 17 g by mouth daily as needed (constipation).     promethazine 12.5 MG tablet  Commonly known as:  PHENERGAN  Take 1 tablet (12.5 mg total) by mouth every 6 (six) hours as needed for nausea. 1-2 prn for nausea begin with 1 if no relief may repeat x's 1     Vitamin D (Ergocalciferol) 50000 UNITS Caps  Commonly known as:  DRISDOL  Take 1 capsule (50,000 Units total) by mouth every 7 (seven) days. Takes on Sundays.         Consults:   None  Significant Diagnostic Studies:  No results found.   [facility] Course:  For complete details please refer to admission H and P, but in brief, She has not been feeling well for this entire week this is a yearly stressful depressive week; in which her son past 03/26/07 and his birthday would have been 03/30[birth year]06.  She has had bilateral arm and leg pain initally 6/10 time of d/c 2/10 . Treatment included: 1. Vaso occlusive painful crisis: tx with gentle hydration IVF, scheduled analgesic hydromorphone 3mg   Q 2 hrs and antiemetic phenergan .  Longacting analgesic resumed.  2. Nausea/vomiting: 1 episode of nausea relieved with phenergan 25 mg po once.   3. Situational stress/depression: secondary to loss of son. Family support , significant other and church/spiratal support declines therapy or medication intervention at this time.  Physical Exam at Discharge:  BP 119/64  Pulse 79  Temp(Src) 98.7 F (37.1 C) (Oral)  Resp 18  SpO2 93%  WUJ:WJXBJ, cooperative, no distress, appears stated age  Cardiovascular: Regular rate and rhythm, S1 and S2 normal, no murmur, rub or gallop  Respiratory:Clear to auscultation bilaterally, no vocal fremitus  Gastrointestinal:Soft, non-tender, bowel  sounds active all four quadrants. BM this AM Extremities:Extremities normal, atraumatic, no cyanosis or edema  Pulses: 2+ and symmetric all extremities     Disposition at Discharge: 01-Home or Self Care  Discharge Orders:   Condition at Discharge:   Stable  Time spent on Discharge:  Greater than 30 minutes.  SignedGrayce Sessions 04/01/2013, 6:54 PM

## 2013-04-01 NOTE — Care Management (Signed)
Patient: Dawn [name] DOB :May 05, 1981 MRN :[id number]  Date: 04/01/2013  Documentation Initiated by : Jefm Miles  Subjective/Objective Assessment: Dawn [name] is a 32 year old female with known SCD. Dawn [name] was in the office today for her followup office visit, and was admitted to the [facility] acute care clinic. Dawn [name] stated her Exjade is going well and she does not currently have any needs or concerns today.   Barriers to Care: None noted Prior Approval (PA) #: na PA start date:na PA end date:  na  Action/Plan: This CM offered assistance with any gaps in her care.  Comments: none  Time spent: 10 minutes  Karoline Caldwell, RB, BSN, [place]     [phone]

## 2013-04-01 NOTE — Progress Notes (Signed)
Patient ID: Dawn [name], female   DOB: 01-May-1981, 33 y.o.   MRN: [id number] Patient discharged home. VSS. Discharge instructions reviewed, prescription given. Patient to call tomorrow to schedule follow up visit. Patient left day hospital ambulatory with belongings via self.

## 2013-04-05 NOTE — Discharge Summary (Signed)
Pt improved with treatment. She has been examined and assessed. Assessment and plan discussed with NP Gwinda Passe. Pt to take OxyContin for the next 3 days then continue her PRN Oxycodone until she is at her baseline of no pain. Agree with plan to D/C to home.

## 2013-04-05 NOTE — H&P (Signed)
Pt seen and examined and discussed with NP Michelle Edwards. Agree with assessment and plan. 

## 2013-05-01 ENCOUNTER — Telehealth (HOSPITAL_COMMUNITY): Payer: Self-pay | Admitting: Hematology

## 2013-05-01 ENCOUNTER — Non-Acute Institutional Stay (HOSPITAL_COMMUNITY)
Admission: RE | Admit: 2013-05-01 | Discharge: 2013-05-01 | Disposition: A | Discharge status: Home / Self Care | Payer: Medicare Other | Source: Ambulatory Visit | Attending: Internal Medicine | Admitting: Internal Medicine

## 2013-05-01 ENCOUNTER — Encounter (HOSPITAL_COMMUNITY): Payer: Self-pay | Admitting: Hematology

## 2013-05-01 DIAGNOSIS — D5700 Hb-SS disease with crisis, unspecified: Secondary | ICD-10-CM | POA: Insufficient documentation

## 2013-05-01 DIAGNOSIS — M549 Dorsalgia, unspecified: Secondary | ICD-10-CM | POA: Insufficient documentation

## 2013-05-01 DIAGNOSIS — M79609 Pain in unspecified limb: Secondary | ICD-10-CM | POA: Insufficient documentation

## 2013-05-01 DIAGNOSIS — R52 Pain, unspecified: Secondary | ICD-10-CM | POA: Diagnosis not present

## 2013-05-01 DIAGNOSIS — Z79899 Other long term (current) drug therapy: Secondary | ICD-10-CM | POA: Insufficient documentation

## 2013-05-01 DIAGNOSIS — G8929 Other chronic pain: Secondary | ICD-10-CM | POA: Insufficient documentation

## 2013-05-01 DIAGNOSIS — R112 Nausea with vomiting, unspecified: Secondary | ICD-10-CM | POA: Diagnosis not present

## 2013-05-01 DIAGNOSIS — F411 Generalized anxiety disorder: Secondary | ICD-10-CM | POA: Diagnosis not present

## 2013-05-01 LAB — RETICULOCYTES
RBC.: 2.42 MIL/uL — ABNORMAL LOW (ref 3.87–5.11)
Retic Count, Absolute: 997 10*3/uL — ABNORMAL HIGH (ref 19.0–186.0)
Retic Ct Pct: 41.2 % — ABNORMAL HIGH (ref 0.4–3.1)

## 2013-05-01 LAB — COMPREHENSIVE METABOLIC PANEL
AST: 80 U/L — ABNORMAL HIGH (ref 0–37)
Albumin: 3.5 g/dL (ref 3.5–5.2)
BUN: 6 mg/dL (ref 6–23)
Calcium: 9 mg/dL (ref 8.4–10.5)
Creatinine, Ser: 0.56 mg/dL (ref 0.50–1.10)
Total Protein: 8 g/dL (ref 6.0–8.3)

## 2013-05-01 LAB — CBC WITH DIFFERENTIAL/PLATELET
Band Neutrophils: 5 % (ref 0–10)
Blasts: 0 %
HCT: 23.3 % — ABNORMAL LOW (ref 36.0–46.0)
Lymphocytes Relative: 40 % (ref 12–46)
MCHC: 36.9 g/dL — ABNORMAL HIGH (ref 30.0–36.0)
MCV: 96.3 fL (ref 78.0–100.0)
Metamyelocytes Relative: 0 %
Monocytes Relative: 3 % (ref 3–12)
Platelets: 222 10*3/uL (ref 150–400)
Promyelocytes Absolute: 0 %
RDW: 24.6 % — ABNORMAL HIGH (ref 11.5–15.5)
WBC: 7.9 10*3/uL (ref 4.0–10.5)
nRBC: 24 /100 WBC — ABNORMAL HIGH

## 2013-05-01 MED ORDER — KETOROLAC TROMETHAMINE 30 MG/ML IJ SOLN
30.0000 mg | Freq: Four times a day (QID) | INTRAMUSCULAR | Status: DC
  Administered 2013-05-01: 30 mg via INTRAVENOUS
  Filled 2013-05-01: qty 1

## 2013-05-01 MED ORDER — OXYCODONE HCL ER 10 MG PO T12A: 10.0000 mg | EXTENDED_RELEASE_TABLET | Freq: Two times a day (BID) | ORAL | Status: DC

## 2013-05-01 MED ORDER — DIPHENHYDRAMINE HCL 50 MG/ML IJ SOLN: 12.5000 mg | INTRAMUSCULAR | Status: DC | PRN

## 2013-05-01 MED ORDER — ONDANSETRON HCL 4 MG/2ML IJ SOLN
4.0000 mg | INTRAMUSCULAR | Status: DC | PRN
  Administered 2013-05-01: 4 mg via INTRAVENOUS
  Filled 2013-05-01: qty 2

## 2013-05-01 MED ORDER — SENNOSIDES-DOCUSATE SODIUM 8.6-50 MG PO TABS: 1.0000 | ORAL_TABLET | Freq: Two times a day (BID) | ORAL | Status: DC

## 2013-05-01 MED ORDER — HYDROMORPHONE HCL PF 2 MG/ML IJ SOLN
2.0000 mg | INTRAMUSCULAR | Status: DC | PRN
  Administered 2013-05-01 (×2): 4 mg via INTRAVENOUS
  Filled 2013-05-01 (×2): qty 2

## 2013-05-01 MED ORDER — LORATADINE 10 MG PO TABS: 10.0000 mg | ORAL_TABLET | Freq: Every day | ORAL | Status: DC

## 2013-05-01 MED ORDER — PROMETHAZINE HCL 12.5 MG PO TABS: 12.5000 mg | ORAL_TABLET | Freq: Four times a day (QID) | ORAL | Status: DC | PRN

## 2013-05-01 MED ORDER — DIPHENHYDRAMINE HCL 25 MG PO CAPS: 25.0000 mg | ORAL_CAPSULE | ORAL | Status: DC | PRN

## 2013-05-01 MED ORDER — SODIUM CHLORIDE 0.9 % IJ SOLN
10.0000 mL | INTRAMUSCULAR | Status: AC | PRN
  Administered 2013-05-01: 10 mL

## 2013-05-01 MED ORDER — ONDANSETRON HCL 4 MG PO TABS: 4.0000 mg | ORAL_TABLET | ORAL | Status: DC | PRN

## 2013-05-01 MED ORDER — DEXTROSE-NACL 5-0.45 % IV SOLN
INTRAVENOUS | Status: DC
  Administered 2013-05-01: 14:00:00 via INTRAVENOUS

## 2013-05-01 MED ORDER — PROMETHAZINE HCL 25 MG/ML IJ SOLN
25.0000 mg | Freq: Once | INTRAMUSCULAR | Status: AC
  Administered 2013-05-01: 25 mg via INTRAMUSCULAR
  Filled 2013-05-01: qty 1

## 2013-05-01 MED ORDER — HEPARIN SOD (PORK) LOCK FLUSH 100 UNIT/ML IV SOLN
500.0000 [IU] | INTRAVENOUS | Status: AC | PRN
  Administered 2013-05-01: 500 [IU]
  Filled 2013-05-01: qty 5

## 2013-05-01 MED ORDER — FOLIC ACID 1 MG PO TABS: 1.0000 mg | ORAL_TABLET | Freq: Every day | ORAL | Status: DC

## 2013-05-01 NOTE — Care Management (Signed)
Patient: Dawn [name] DOB :1981-08-08 MRN :[id number]  Date: 05/01/2013  Documentation Initiated by : Jefm Miles  Subjective/Objective Assessment: Dawn [name] is a 32 year old female with known SCD, she is being seen in the day hospital for acute pain crisis. Dawn [name] request to speak with the CM regarding psychosocial concerns. She tearfully states she has been feeling a little anxious lately. Dawn [name] shared her concerns and thoughts.    Barriers to Care: Anxiety and needs a new [facility] controlled substance agreement signed.    Prior Approval (PA) #: NA PA start date:NA PA end date:  NA  Time spent: 1 hour and 15 mins.  Action/Plan:This CM processed with Dawn [name] regarding her her behaviors/expectations  and ways to address her concerns. This CM had Dawn [name] update her medication/narcotis agreement. This CM will provide DawnNolan with this CM's contact information. This CM will continue to monitor.    Comments: None  Dawn Caldwell, RN, BSN, [place]     [phone]

## 2013-05-01 NOTE — Discharge Summary (Signed)
[facility] Discharge Summary   Patient ID: Dawn [name] MRN: [id number] DOB/AGE: 32-09-1980 32 y.o.  Admit date: 05/01/2013 Discharge date: 05/01/2013  Primary Care Physician:  Marthann Schiller, MD  Admission Diagnoses:  Active Problems:   Vaso occlusive crisis with hemolysis     Anxiety    Discharge Diagnoses:    Vaso occlusive crisis with hemolysis     Anxiety   Discharge Medications:    Medication List    ASK your doctor about these medications       cetirizine 10 MG tablet  Commonly known as:  ZYRTEC  Take 1 tablet (10 mg total) by mouth daily as needed. For allergies     deferasirox 500 MG disintegrating tablet  Commonly known as:  EXJADE  Take 1,500 mg by mouth daily before breakfast.     folic acid 1 MG tablet  Commonly known as:  FOLVITE  Take 1 mg by mouth daily.     hydroxyurea 500 MG capsule  Commonly known as:  HYDREA  Take 1-2 capsules (500-1,000 mg total) by mouth as directed. Take 500mg  (1 tablet) ON ODD DAYS OF THE MONTH AND 1000 MG (2) TABLETS ON THE EVEN DAYS.     ibuprofen 200 MG tablet  Commonly known as:  ADVIL,MOTRIN  Take 800 mg by mouth every 6 (six) hours as needed for pain.     OxyCODONE 10 mg T12a 12 hr tablet  Commonly known as:  OXYCONTIN  Take 1 tablet (10 mg total) by mouth every 12 (twelve) hours.     Oxycodone HCl 10 MG Tabs  Take 1 tablet (10 mg total) by mouth every 4 (four) hours as needed.     polyethylene glycol packet  Commonly known as:  MIRALAX / GLYCOLAX  Take 17 g by mouth daily as needed (constipation).     promethazine 12.5 MG tablet  Commonly known as:  PHENERGAN  Take 1 tablet (12.5 mg total) by mouth every 6 (six) hours as needed for nausea. 1-2 prn for nausea begin with 1 if no relief may repeat x's 1     Vitamin D (Ergocalciferol) 50000 UNITS Caps capsule  Commonly known as:  DRISDOL  Take 1 capsule (50,000 Units total) by mouth every 7 (seven) days. Takes on Sundays.         Consults:    None  Significant Diagnostic Studies:  No results found.   [facility] Course:  For complete details please refer to admission H and P, but in brief, Dawn [name] is a 32 years old [name] genotype Hemoglobin SS SCD. She has a PMH:of depression. She rates her pain as 6/10 and her base line is is pain free. Her pain is in her lower back, arms and legs. She has not been feeling well for approximately 1 week.  She was admitted to the [facility] for the management of her symptoms. Treatment course included: 1. Vaso occlusive crisis with active hemolysis: gentle hydration IVF,  analgesic Q 2hrs prn , antemetic and antipruitic prn . Adjunct therapy added Toradol  and heating pad to effected areas.  2. Acute on chronic pain: IV analgesics prn still not controlled d/c with scheduling prn analgesic for the next 2-3 days and using ibuprofen.  3. Nausea with emesis: po Zofran and 25mg  pheregan IM  Physical Exam at Discharge:  BP 134/82  Pulse 102  Temp(Src) 98.8 F (37.1 C) (Oral)  Resp 18  Ht 5\' 3"  (1.6 m)  Wt 68.947 kg (152 lb)  BMI 26.93 kg/m2  SpO2 92%  UJW:JXBJY, cooperative, no distress, appears stated age  Cardiovascular:Regular rate and rhythm, S1 and S2 normal, no murmur, rub or gallop  Respiratory:Clear to auscultation bilaterally, respirations unlabored, No vocal fremitus  Gastrointestinal:Soft, non-tender, bowel sounds active all four quadrants,  no masses, no organomegaly  Extremities:Extremities normal, atraumatic, no cyanosis or edema  Pulses: 2+ and symmetric all extremities   Disposition at Discharge: 01-Home or Self Care  Discharge Orders:   Condition at Discharge:   Stable  Time spent on Discharge:  Greater than 30 minutes.  SignedGrayce Sessions 05/01/2013, 4:33 PM

## 2013-05-01 NOTE — H&P (Signed)
[facility] History and Physical   Date: 05/01/2013  Patient name: Dawn [name] Medical record number: [id number] Date of birth: [birth year] Age: 32 y.o. Gender: female PCP: August Saucer, ERIC, MD  Attending physician: Rometta Emery, MD  Chief Complaint:  She has pain in her arms, legs, and back  History of Present Illness: Dawn [name] is a 32 years old [name] genotype Hemoglobin SS SCD.  She has a PMH:of depression.  She rates her pain as 6/10 and her base line is is pain free. Her pain is in her lower back, arms and legs. She has not been feeling well for approximately 1 week.          Provacative factors is stressful and increase humidity. Pallative factors are heat to the affected areas and rest. She denies chest pain, shortness of breath, n/v, dysuria, constipation, and diarrhea.  Meds: Prescriptions prior to admission  Medication Sig Dispense Refill  . cetirizine (ZYRTEC) 10 MG tablet Take 1 tablet (10 mg total) by mouth daily as needed. For allergies  30 tablet  2  . deferasirox (EXJADE) 500 MG disintegrating tablet Take 1,500 mg by mouth daily before breakfast.      . folic acid (FOLVITE) 1 MG tablet Take 1 mg by mouth daily.        . hydroxyurea (HYDREA) 500 MG capsule Take 1-2 capsules (500-1,000 mg total) by mouth as directed. Take 500mg  (1 tablet) ON ODD DAYS OF THE MONTH AND 1000 MG (2) TABLETS ON THE EVEN DAYS.  46 capsule  2  . OxyCODONE (OXYCONTIN) 10 mg T12A Take 1 tablet (10 mg total) by mouth every 12 (twelve) hours.  60 tablet  0  . Oxycodone HCl 10 MG TABS Take 1 tablet (10 mg total) by mouth every 4 (four) hours as needed.  60 tablet  0  . promethazine (PHENERGAN) 12.5 MG tablet Take 1 tablet (12.5 mg total) by mouth every 6 (six) hours as needed for nausea. 1-2 prn for nausea begin with 1 if no relief may repeat x's 1  30 tablet  0  . Vitamin D, Ergocalciferol, (DRISDOL) 50000 UNITS CAPS Take 1 capsule (50,000 Units total) by mouth every 7 (seven)  days. Takes on Sundays.  30 capsule  11  . ibuprofen (ADVIL,MOTRIN) 200 MG tablet Take 800 mg by mouth every 6 (six) hours as needed for pain.      . polyethylene glycol (MIRALAX / GLYCOLAX) packet Take 17 g by mouth daily as needed (constipation).        Allergies: Desferal; Latex; Lisinopril; and Tape Past Medical History  Diagnosis Date  . Sickle cell disease   . Sickle cell disease, type S   . Blood transfusion   . 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 Cholecystectomy    .  left knee acl reconstruction    . Cesarean section      x 2  . Portacath placement    . Portacath placement Left 02/23/2013    Procedure: INSERTION PORT-A-CATH;  Surgeon: Fabio Bering, MD;  Location: [facility];  Service: General;  Laterality: Left;   Family History  Problem Relation Age of Onset  . Sickle cell trait Mother   . Sickle cell trait Father   . Hypertension Father   . Diabetes Father   . Sickle cell trait Sister   . Cancer Paternal Aunt     Breast   History  Social History  . Marital Status: Single    Spouse Name: N/A    Number of Children: N/A  . Years of Education: N/A   Occupational History  . Not on file.   Social History Main Topics  . Smoking status: Never Smoker   . Smokeless tobacco: Not on file  . Alcohol Use: No  . Drug Use: No  . Sexually Active: Yes    Birth Control/ Protection: IUD   Other Topics Concern  . Not on file   Social History Narrative  . No narrative on file    Review of Systems: A comprehensive review of systems was negative except for: Musculoskeletal: positive for back pain, muscle weakness and arm and legs   Physical Exam: Blood pressure 134/82, pulse 102, temperature 98.8 F (37.1 C), temperature source Oral, resp. rate 18, height 5\' 3"  (1.6 m), weight 68.947 kg (152 lb), SpO2 92.00%. BP 134/82  Pulse 102  Temp(Src) 98.8 F (37.1 C) (Oral)  Resp 18  Ht 5\' 3"  (1.6 m)  Wt  68.947 kg (152 lb)  BMI 26.93 kg/m2  SpO2 92%  General Appearance:    Alert, cooperative, no distress, appears stated age  Head:    Normocephalic, without obvious abnormality, atraumatic  Eyes:    PERRL, conjunctiva/corneas clear, EOM's intact, fundi    benign, both eyes  Ears:    Normal TM's and external ear canals, both ears  Nose:   Nares normal, septum midline, mucosa normal, no drainage    or sinus tenderness  Throat:   Lips, mucosa, and tongue normal; teeth and gums normal  Neck:   Supple, symmetrical, trachea midline, no carotid   bruit or JVD  Back:     Symmetric, no curvature, ROM normal, no CVA tenderness  Lungs:     Clear to auscultation bilaterally, respirations unlabored, No vocal fremitus   Chest Wall:    No tenderness or deformity   Heart:    Regular rate and rhythm, S1 and S2 normal, no murmur, rub   or gallop  Breast Exam:  Deferred   Abdomen:     Soft, non-tender, bowel sounds active all four quadrants,    no masses, no organomegaly  Genitalia:  Deferred   Rectal:  Deferred   Extremities:   Extremities normal, atraumatic, no cyanosis or edema  Pulses:   2+ and symmetric all extremities  Skin:   Skin color, texture, turgor normal, no rashes or lesions  Lymph nodes:   Cervical, supraclavicular, and axillary nodes normal  Neurologic:   CNII-XII intact, normal strength, sensation and reflexes    throughout    Lab results: Results for orders placed during the hospital encounter of 05/01/13 (from the past 24 hour(s))  COMPREHENSIVE METABOLIC PANEL     Status: Abnormal   Collection Time    05/01/13  2:15 PM      Result Value Range   Sodium 139  135 - 145 mEq/L   Potassium 3.8  3.5 - 5.1 mEq/L   Chloride 103  96 - 112 mEq/L   CO2 24  19 - 32 mEq/L   Glucose, Bld 79  70 - 99 mg/dL   BUN 6  6 - 23 mg/dL   Creatinine, Ser 1.61  0.50 - 1.10 mg/dL   Calcium 9.0  8.4 - 09.6 mg/dL   Total Protein 8.0  6.0 - 8.3 g/dL   Albumin 3.5  3.5 - 5.2 g/dL   AST 80 (*) 0 - 37  U/L  ALT 29  0 - 35 U/L   Alkaline Phosphatase 201 (*) 39 - 117 U/L   Total Bilirubin 7.3 (*) 0.3 - 1.2 mg/dL   GFR calc non Af Amer >90  >90 mL/min   GFR calc Af Amer >90  >90 mL/min    Imaging results:  No results found.   Assessment & Plan: 1. Vaso occlusive crisis with active hemolysis:  gentle hydration IVF, scheduled analgesic Q 2hrs prn , antemetic and antipruitic prn . Adjunct therapy Toradol for inflammatory/reactive process and heating pad.  2. Anxiety will need f/u with PCP   Chaylee Ehrsam P 05/01/2013, 3:27 PM

## 2013-05-01 NOTE — Telephone Encounter (Signed)
Spoke with Marcelino Duster, NP, and it is ok for patient to come to [facility].

## 2013-05-01 NOTE — Progress Notes (Signed)
Patient ID: Dawn [name], female   DOB: 1980-11-15, 32 y.o.   MRN: [id number] Port de accessed and flush per protocol, questions answered, belongings returned.  Patient left [facility] ambulatory

## 2013-05-01 NOTE — Telephone Encounter (Signed)
Patient called in C/O pain to arms, legs, and back that is 6/10.  Patient states she has taken oxycodone q4hrs since last night.  Denies nausea/vomiting, chest pain, fever, or abdominal pain.  I advised I would notify the physician and give her a call back.  Patient verbalized understanding.

## 2013-05-04 ENCOUNTER — Encounter (HOSPITAL_COMMUNITY): Payer: Self-pay

## 2013-05-04 ENCOUNTER — Non-Acute Institutional Stay (HOSPITAL_BASED_OUTPATIENT_CLINIC_OR_DEPARTMENT_OTHER)
Admission: AD | Admit: 2013-05-04 | Discharge: 2013-05-04 | Disposition: A | Discharge status: Home / Self Care | Payer: Medicare Other | Source: Home / Self Care | Attending: Internal Medicine | Admitting: Internal Medicine

## 2013-05-04 DIAGNOSIS — D5701 Hb-SS disease with acute chest syndrome: Secondary | ICD-10-CM | POA: Diagnosis not present

## 2013-05-04 DIAGNOSIS — F329 Major depressive disorder, single episode, unspecified: Secondary | ICD-10-CM | POA: Diagnosis not present

## 2013-05-04 DIAGNOSIS — R269 Unspecified abnormalities of gait and mobility: Secondary | ICD-10-CM | POA: Diagnosis not present

## 2013-05-04 DIAGNOSIS — R05 Cough: Secondary | ICD-10-CM | POA: Diagnosis not present

## 2013-05-04 DIAGNOSIS — Z79899 Other long term (current) drug therapy: Secondary | ICD-10-CM | POA: Insufficient documentation

## 2013-05-04 DIAGNOSIS — R000 Tachycardia, unspecified: Secondary | ICD-10-CM | POA: Diagnosis present

## 2013-05-04 DIAGNOSIS — G8929 Other chronic pain: Secondary | ICD-10-CM | POA: Insufficient documentation

## 2013-05-04 DIAGNOSIS — B952 Enterococcus as the cause of diseases classified elsewhere: Secondary | ICD-10-CM | POA: Diagnosis not present

## 2013-05-04 DIAGNOSIS — R0902 Hypoxemia: Secondary | ICD-10-CM | POA: Diagnosis not present

## 2013-05-04 DIAGNOSIS — R52 Pain, unspecified: Secondary | ICD-10-CM | POA: Insufficient documentation

## 2013-05-04 DIAGNOSIS — D571 Sickle-cell disease without crisis: Secondary | ICD-10-CM | POA: Diagnosis not present

## 2013-05-04 DIAGNOSIS — M79609 Pain in unspecified limb: Secondary | ICD-10-CM | POA: Diagnosis not present

## 2013-05-04 DIAGNOSIS — J189 Pneumonia, unspecified organism: Secondary | ICD-10-CM | POA: Diagnosis not present

## 2013-05-04 DIAGNOSIS — F411 Generalized anxiety disorder: Secondary | ICD-10-CM

## 2013-05-04 DIAGNOSIS — R040 Epistaxis: Secondary | ICD-10-CM | POA: Diagnosis present

## 2013-05-04 DIAGNOSIS — F4321 Adjustment disorder with depressed mood: Secondary | ICD-10-CM | POA: Diagnosis not present

## 2013-05-04 DIAGNOSIS — D5700 Hb-SS disease with crisis, unspecified: Secondary | ICD-10-CM | POA: Insufficient documentation

## 2013-05-04 DIAGNOSIS — E876 Hypokalemia: Secondary | ICD-10-CM | POA: Diagnosis present

## 2013-05-04 DIAGNOSIS — R509 Fever, unspecified: Secondary | ICD-10-CM | POA: Diagnosis not present

## 2013-05-04 DIAGNOSIS — F341 Dysthymic disorder: Secondary | ICD-10-CM | POA: Diagnosis present

## 2013-05-04 DIAGNOSIS — R112 Nausea with vomiting, unspecified: Secondary | ICD-10-CM | POA: Insufficient documentation

## 2013-05-04 DIAGNOSIS — M5380 Other specified dorsopathies, site unspecified: Secondary | ICD-10-CM | POA: Diagnosis not present

## 2013-05-04 DIAGNOSIS — M62838 Other muscle spasm: Secondary | ICD-10-CM | POA: Diagnosis not present

## 2013-05-04 DIAGNOSIS — N390 Urinary tract infection, site not specified: Secondary | ICD-10-CM | POA: Diagnosis not present

## 2013-05-04 DIAGNOSIS — R82998 Other abnormal findings in urine: Secondary | ICD-10-CM | POA: Diagnosis not present

## 2013-05-04 DIAGNOSIS — I10 Essential (primary) hypertension: Secondary | ICD-10-CM | POA: Diagnosis not present

## 2013-05-04 DIAGNOSIS — D72829 Elevated white blood cell count, unspecified: Secondary | ICD-10-CM | POA: Diagnosis not present

## 2013-05-04 LAB — COMPREHENSIVE METABOLIC PANEL
Alkaline Phosphatase: 192 U/L — ABNORMAL HIGH (ref 39–117)
BUN: 4 mg/dL — ABNORMAL LOW (ref 6–23)
Chloride: 101 mEq/L (ref 96–112)
GFR calc Af Amer: >90 mL/min (ref >90)
Glucose, Bld: 83 mg/dL (ref 70–99)
Potassium: 3.9 mEq/L (ref 3.5–5.1)
Total Bilirubin: 7.3 mg/dL — ABNORMAL HIGH (ref 0.3–1.2)

## 2013-05-04 LAB — CBC WITH DIFFERENTIAL/PLATELET
Basophils Absolute: 0.1 10*3/uL (ref 0.0–0.1)
Lymphocytes Relative: 19 % (ref 12–46)
Lymphs Abs: 1.6 10*3/uL (ref 0.7–4.0)
Monocytes Relative: 13 % — ABNORMAL HIGH (ref 3–12)
Platelets: 247 10*3/uL (ref 150–400)
RDW: 24.3 % — ABNORMAL HIGH (ref 11.5–15.5)
WBC: 8.5 10*3/uL (ref 4.0–10.5)

## 2013-05-04 LAB — RETICULOCYTES
RBC.: 2.39 MIL/uL — ABNORMAL LOW (ref 3.87–5.11)
Retic Ct Pct: 33 % — ABNORMAL HIGH (ref 0.4–3.1)

## 2013-05-04 MED ORDER — HYDROXYUREA 500 MG PO CAPS: 1000.0000 mg | ORAL_CAPSULE | ORAL | Status: DC

## 2013-05-04 MED ORDER — PROMETHAZINE HCL 12.5 MG RE SUPP
12.5000 mg | RECTAL | Status: DC | PRN
  Filled 2013-05-04: qty 2

## 2013-05-04 MED ORDER — DEXTROSE-NACL 5-0.45 % IV SOLN
INTRAVENOUS | Status: DC
  Administered 2013-05-04: 12:00:00 via INTRAVENOUS

## 2013-05-04 MED ORDER — HYDROMORPHONE HCL PF 2 MG/ML IJ SOLN
2.0000 mg | INTRAMUSCULAR | Status: DC | PRN
  Administered 2013-05-04 (×2): 4 mg via INTRAVENOUS
  Administered 2013-05-04: 2 mg via INTRAVENOUS
  Administered 2013-05-04: 4 mg via INTRAVENOUS
  Filled 2013-05-04 (×3): qty 2
  Filled 2013-05-04: qty 1

## 2013-05-04 MED ORDER — DIPHENHYDRAMINE HCL 25 MG PO CAPS
25.0000 mg | ORAL_CAPSULE | ORAL | Status: DC | PRN
  Administered 2013-05-04: 25 mg via ORAL
  Filled 2013-05-04: qty 1

## 2013-05-04 MED ORDER — FOLIC ACID 1 MG PO TABS: 1.0000 mg | ORAL_TABLET | Freq: Every day | ORAL | Status: DC

## 2013-05-04 MED ORDER — SODIUM CHLORIDE 0.9 % IJ SOLN: 10.0000 mL | INTRAMUSCULAR | Status: DC | PRN

## 2013-05-04 MED ORDER — PROMETHAZINE HCL 25 MG PO TABS
12.5000 mg | ORAL_TABLET | ORAL | Status: DC | PRN
  Administered 2013-05-04: 12.5 mg via ORAL
  Filled 2013-05-04: qty 1

## 2013-05-04 MED ORDER — HYDROXYUREA 500 MG PO CAPS: 500.0000 mg | ORAL_CAPSULE | ORAL | Status: DC

## 2013-05-04 MED ORDER — PROMETHAZINE HCL 25 MG/ML IJ SOLN
12.5000 mg | Freq: Four times a day (QID) | INTRAMUSCULAR | Status: DC | PRN
  Administered 2013-05-04: 12.5 mg via INTRAVENOUS
  Filled 2013-05-04: qty 1

## 2013-05-04 MED ORDER — SENNOSIDES-DOCUSATE SODIUM 8.6-50 MG PO TABS
1.0000 | ORAL_TABLET | Freq: Two times a day (BID) | ORAL | Status: DC
  Filled 2013-05-04: qty 1

## 2013-05-04 MED ORDER — KETOROLAC TROMETHAMINE 30 MG/ML IJ SOLN
30.0000 mg | Freq: Four times a day (QID) | INTRAMUSCULAR | Status: DC
  Administered 2013-05-04 (×2): 30 mg via INTRAVENOUS
  Filled 2013-05-04 (×2): qty 1

## 2013-05-04 MED ORDER — HEPARIN SOD (PORK) LOCK FLUSH 100 UNIT/ML IV SOLN
500.0000 [IU] | INTRAVENOUS | Status: AC | PRN
  Administered 2013-05-04: 500 [IU]
  Filled 2013-05-04: qty 5

## 2013-05-04 MED ORDER — DIPHENHYDRAMINE HCL 50 MG/ML IJ SOLN: 12.5000 mg | INTRAMUSCULAR | Status: DC | PRN

## 2013-05-04 NOTE — Progress Notes (Signed)
Pt discharged to home; discharge instructions given and all questions answered; port deaccessed and flushed with no complications noted; pt ambulatory upon discharge

## 2013-05-04 NOTE — H&P (Signed)
[facility] History and Physical   Date: 05/04/2013  Patient name: Dawn [name] Medical record number: [id number] Date of birth: 06-Jan-1981 Age: 32 y.o. Gender: female PCP: August Saucer ERIC, MD  Attending physician: Altha Harm, MD  Chief Complaint:  Lower back and bilateral leg pain  History of Present Illness: Dawn [name] is a 32 years old [name] with genotype Hemoglobin SS SCD. She is well known to me and is not opoid naive. She has a PMH:of depression. She rates her pain as 7/10 and her base line is is pain free 0/10. Her pain is in her lower back and legs. She tired to manage her symptoms over the w/e however her pain became progressively worst after a stressful event. Pallative factors are heat to the affected areas and rest. She denies chest pain, shortness of breath, n/v, dysuria, constipation, and diarrhea. Meds: Prescriptions prior to admission  Medication Sig Dispense Refill  . cetirizine (ZYRTEC) 10 MG tablet Take 1 tablet (10 mg total) by mouth daily as needed. For allergies  30 tablet  2  . deferasirox (EXJADE) 500 MG disintegrating tablet Take 1,500 mg by mouth daily before breakfast.      . folic acid (FOLVITE) 1 MG tablet Take 1 mg by mouth daily.        . hydroxyurea (HYDREA) 500 MG capsule Take 1-2 capsules (500-1,000 mg total) by mouth as directed. Take 500mg  (1 tablet) ON ODD DAYS OF THE MONTH AND 1000 MG (2) TABLETS ON THE EVEN DAYS.  46 capsule  2  . ibuprofen (ADVIL,MOTRIN) 200 MG tablet Take 800 mg by mouth every 6 (six) hours as needed for pain.      . Oxycodone HCl 10 MG TABS Take 1 tablet (10 mg total) by mouth every 4 (four) hours as needed.  60 tablet  0  . polyethylene glycol (MIRALAX / GLYCOLAX) packet Take 17 g by mouth daily as needed (constipation).      . promethazine (PHENERGAN) 12.5 MG tablet Take 1 tablet (12.5 mg total) by mouth every 6 (six) hours as needed for nausea. 1-2 prn for nausea begin with 1 if no relief may repeat  x's 1  45 tablet  0  . Vitamin D, Ergocalciferol, (DRISDOL) 50000 UNITS CAPS Take 1 capsule (50,000 Units total) by mouth every 7 (seven) days. Takes on Sundays.  30 capsule  11    Allergies: Desferal; Latex; Lisinopril; and Tape Past Medical History  Diagnosis Date  . Sickle cell disease   . Sickle cell disease, type S   . Blood transfusion   . 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 Cholecystectomy    .  left knee acl reconstruction    . Cesarean section      x 2  . Portacath placement    . Portacath placement Left 02/23/2013    Procedure: INSERTION PORT-A-CATH;  Surgeon: Fabio Bering, MD;  Location: [facility];  Service: General;  Laterality: Left;   Family History  Problem Relation Age of Onset  . Sickle cell trait Mother   . Sickle cell trait Father   . Hypertension Father   . Diabetes Father   . Sickle cell trait Sister   . Cancer Paternal Aunt     Breast   History   Social History  . Marital Status: Single    Spouse Name: N/A    Number of Children: N/A  . Years of Education:  N/A   Occupational History  . Not on file.   Social History Main Topics  . Smoking status: Never Smoker   . Smokeless tobacco: Not on file  . Alcohol Use: No  . Drug Use: No  . Sexually Active: Yes    Birth Control/ Protection: IUD   Other Topics Concern  . Not on file   Social History Narrative  . No narrative on file    Review of Systems: A comprehensive review of systems was negative except for: Musculoskeletal: positive for back pain and legs   Physical Exam: Blood pressure 135/86, pulse 92, temperature 98.7 F (37.1 C), temperature source Oral, resp. rate 16, SpO2 99.00%. BP 135/86  Pulse 92  Temp(Src) 98.7 F (37.1 C) (Oral)  Resp 16  SpO2 99%  General Appearance:    Alert, cooperative, no distress, appears stated age  Head:    Normocephalic, without obvious abnormality, atraumatic  Eyes:    PERRL,, EOM's  intact, moderate icteric sclarea   Ears:    Normal TM's and external ear canals, both ears  Nose:   Nares normal, septum midline, mucosa normal, no drainage    or sinus tenderness  Throat:   Lips, mucosa, and tongue normal; teeth and gums normal  Neck:   Supple, symmetrical, trachea midline, no adenopathy;    thyroid:  no enlargement/tenderness/nodules; no carotid   bruit or JVD  Back:     Symmetric, no curvature, ROM normal, no CVA tenderness  Lungs:     Clear to auscultation bilaterally, respirations unlabored  Chest Wall:    No tenderness or deformity   Heart:    Regular rate and rhythm, S1 and S2 normal, no murmur, rub   or gallop  Breast Exam:    No tenderness, masses, or nipple abnormality  Abdomen:     Soft, non-tender, bowel sounds active all four quadrants  Genitalia:  Deferred   Rectal:  Deferred   Extremities:   Extremities normal, atraumatic, no cyanosis or edema  Pulses:   2+ and symmetric all extremities  Skin:   Skin color, texture, turgor normal, no rashes or lesions  Lymph nodes:   Cervical, supraclavicular, and axillary nodes normal  Neurologic:   CNII-XII intact, normal strength  Lab results: No results found for this or any previous visit (from the past 24 hour(s)).  Imaging results:  No results found.   Assessment & Plan: Vaso occlusive  crisis: secondary to barometric changes and stressors. Gently hydrate with D 51/2 NS @ 125 cc/hr, 2-4 mg analgesic prn for pain management . Antiemetic phenergan prn ( is more effective than Zofran ) and antipruitics prn.  Anxiety secondary to recent life stressors:  Tearful and crying she may need low dose of xanax .25 monitor for now   [name] P 05/04/2013, 11:13 AM

## 2013-05-04 NOTE — Discharge Summary (Signed)
[facility] Discharge Summary   Patient ID: Dawn [name] MRN: [id number] DOB/AGE: 1980-10-22 32 y.o.  Admit date: 05/04/2013 Discharge date: 05/04/2013  Primary Care Physician:  Dawn Schiller, MD  Admission Diagnoses:  Active Problems:   Vaso occlusive crisis with hemolysis     Anxiety    Discharge Diagnoses:    Vaso occlusive crisis with hemolysis     Anxiety   Discharge Medications:    Medication List    ASK your doctor about these medications       cetirizine 10 MG tablet  Commonly known as:  ZYRTEC  Take 1 tablet (10 mg total) by mouth daily as needed. For allergies     deferasirox 500 MG disintegrating tablet  Commonly known as:  EXJADE  Take 1,500 mg by mouth daily before breakfast.     folic acid 1 MG tablet  Commonly known as:  FOLVITE  Take 1 mg by mouth daily.     hydroxyurea 500 MG capsule  Commonly known as:  HYDREA  Take 1-2 capsules (500-1,000 mg total) by mouth as directed. Take 500mg  (1 tablet) ON ODD DAYS OF THE MONTH AND 1000 MG (2) TABLETS ON THE EVEN DAYS.     ibuprofen 200 MG tablet  Commonly known as:  ADVIL,MOTRIN  Take 800 mg by mouth every 6 (six) hours as needed for pain.     Oxycodone HCl 10 MG Tabs  Take 1 tablet (10 mg total) by mouth every 4 (four) hours as needed.     polyethylene glycol packet  Commonly known as:  MIRALAX / GLYCOLAX  Take 17 g by mouth daily as needed (constipation).     promethazine 12.5 MG tablet  Commonly known as:  PHENERGAN  Take 1 tablet (12.5 mg total) by mouth every 6 (six) hours as needed for nausea. 1-2 prn for nausea begin with 1 if no relief may repeat x's 1     Vitamin D (Ergocalciferol) 50000 UNITS Caps capsule  Commonly known as:  DRISDOL  Take 1 capsule (50,000 Units total) by mouth every 7 (seven) days. Takes on Sundays.         Consults:   None  Significant Diagnostic Studies:  No results found.   [facility] Course:  For complete details please  refer to admission H and P, but in brief, [name] is a 32 years old [name] genotype Hemoglobin SS SCD. She has a PMH:of depression. She rates her pain as 7/10.  Her pain is in her lower back and legs. She returned to the [facility] after trying to manage her Vaso occlusive crisis pain over the weekend with little to no relief using her prescribed analgesics. She was admitted to the [facility] for the management of her symptoms. Treatment course included: 1. Vaso occlusive crisis with active hemolysis:  (RPI-6.72) gentle hydration IVF,  analgesic Q 2hrs prn , antemetic and antipruitic prn . Adjunct therapy added Toradol  and heating pad to effected areas. Discussed with the pt her labs have little change since last week and recc. Admitting her to inpatient for further treatment. Patient wishes to go home at this time and get her things in order in the event she will need to return for admission.  2. Acute on chronic pain:  She received IV analgesics prn and Toradol for adjunct therapy. Her pain is still not controlled at the time of d/c. She is reluctant to use long acting analgesic for the next couple  of days and cont  her oxycodone 10 mg but instructed shemay use 1-2 to control her pain  with scheduling prn analgesic for the next 2-3 days in combination with 800mg  of ibuprofen.  3. Nausea with emesis: 1 episode of emesis controlled with 12.5mg  IV  Pheregan.  She was unable to tolerate phenergan po.  Physical Exam at Discharge:  BP 119/65  Pulse 91  Temp(Src) 98.7 F (37.1 C) (Oral)  Resp 18  SpO2 96%  ZOX:WRUEA, cooperative, teary eyes with episodes of crying, moderate  distress, appears stated age  Cardiovascular:Regular rate and rhythm, S1 and S2 normal, no murmur, rub or gallop  Respiratory:Clear to auscultation bilaterally, respirations unlabored, No vocal fremitus  Gastrointestinal:Soft, non-tender, bowel sounds active all four quadrants,  no masses, no organomegaly   Extremities:Extremities normal, atraumatic, no cyanosis or edema  Pulses: 2+ and symmetric all extremities   Disposition at Discharge: 01-Home or Self Care  Discharge Orders: Oxycodone 0mg  1-2 Q 4hrs prn and ibuprofen 800mg  tid both take with food .   Condition at Discharge:   Stable  Time spent on Discharge:  Greater than 30 minutes.  SignedGrayce [name] 05/04/2013, 11:44 PM

## 2013-05-05 ENCOUNTER — Inpatient Hospital Stay (HOSPITAL_COMMUNITY)
Admission: AD | Admit: 2013-05-05 | Discharge: 2013-05-19 | DRG: 811 | Disposition: A | Discharge status: Home / Self Care | Payer: Medicare Other | Source: Ambulatory Visit | Attending: Internal Medicine | Admitting: Internal Medicine

## 2013-05-05 DIAGNOSIS — R509 Fever, unspecified: Secondary | ICD-10-CM

## 2013-05-05 DIAGNOSIS — R040 Epistaxis: Secondary | ICD-10-CM | POA: Diagnosis present

## 2013-05-05 DIAGNOSIS — R112 Nausea with vomiting, unspecified: Secondary | ICD-10-CM

## 2013-05-05 DIAGNOSIS — F32A Depression, unspecified: Secondary | ICD-10-CM

## 2013-05-05 DIAGNOSIS — D571 Sickle-cell disease without crisis: Secondary | ICD-10-CM

## 2013-05-05 DIAGNOSIS — F341 Dysthymic disorder: Secondary | ICD-10-CM | POA: Diagnosis present

## 2013-05-05 DIAGNOSIS — E876 Hypokalemia: Secondary | ICD-10-CM | POA: Diagnosis present

## 2013-05-05 DIAGNOSIS — R0902 Hypoxemia: Secondary | ICD-10-CM

## 2013-05-05 DIAGNOSIS — R042 Hemoptysis: Secondary | ICD-10-CM | POA: Diagnosis not present

## 2013-05-05 DIAGNOSIS — B952 Enterococcus as the cause of diseases classified elsewhere: Secondary | ICD-10-CM | POA: Diagnosis present

## 2013-05-05 DIAGNOSIS — M62838 Other muscle spasm: Secondary | ICD-10-CM | POA: Diagnosis not present

## 2013-05-05 DIAGNOSIS — R8271 Bacteriuria: Secondary | ICD-10-CM

## 2013-05-05 DIAGNOSIS — N390 Urinary tract infection, site not specified: Secondary | ICD-10-CM

## 2013-05-05 DIAGNOSIS — J189 Pneumonia, unspecified organism: Secondary | ICD-10-CM

## 2013-05-05 DIAGNOSIS — F329 Major depressive disorder, single episode, unspecified: Secondary | ICD-10-CM

## 2013-05-05 DIAGNOSIS — R000 Tachycardia, unspecified: Secondary | ICD-10-CM | POA: Diagnosis present

## 2013-05-05 DIAGNOSIS — I10 Essential (primary) hypertension: Secondary | ICD-10-CM

## 2013-05-05 DIAGNOSIS — R269 Unspecified abnormalities of gait and mobility: Secondary | ICD-10-CM

## 2013-05-05 DIAGNOSIS — M62830 Muscle spasm of back: Secondary | ICD-10-CM

## 2013-05-05 DIAGNOSIS — D5701 Hb-SS disease with acute chest syndrome: Secondary | ICD-10-CM

## 2013-05-05 DIAGNOSIS — D72829 Elevated white blood cell count, unspecified: Secondary | ICD-10-CM

## 2013-05-05 DIAGNOSIS — M5380 Other specified dorsopathies, site unspecified: Secondary | ICD-10-CM | POA: Diagnosis present

## 2013-05-05 DIAGNOSIS — D5700 Hb-SS disease with crisis, unspecified: Principal | ICD-10-CM

## 2013-05-05 HISTORY — DX: Pneumonia, unspecified organism: J18.9

## 2013-05-05 LAB — CBC WITH DIFFERENTIAL/PLATELET
Basophils Relative: 1 % (ref 0–1)
Eosinophils Relative: 1 % (ref 0–5)
HCT: 19.7 % — ABNORMAL LOW (ref 36.0–46.0)
Hemoglobin: 7.4 g/dL — ABNORMAL LOW (ref 12.0–15.0)
Lymphocytes Relative: 25 % (ref 12–46)
Lymphs Abs: 2.3 10*3/uL (ref 0.7–4.0)
MCH: 36.1 pg — ABNORMAL HIGH (ref 26.0–34.0)
MCHC: 37.6 g/dL — ABNORMAL HIGH (ref 30.0–36.0)
Neutrophils Relative %: 58 % (ref 43–77)
RBC: 2.05 MIL/uL — ABNORMAL LOW (ref 3.87–5.11)
nRBC: 20 /100 WBC — ABNORMAL HIGH

## 2013-05-05 LAB — COMPREHENSIVE METABOLIC PANEL
AST: 88 U/L — ABNORMAL HIGH (ref 0–37)
Albumin: 2.9 g/dL — ABNORMAL LOW (ref 3.5–5.2)
Alkaline Phosphatase: 179 U/L — ABNORMAL HIGH (ref 39–117)
CO2: 27 mEq/L (ref 19–32)
Chloride: 104 mEq/L (ref 96–112)
GFR calc non Af Amer: >90 mL/min (ref >90)
Potassium: 4.2 mEq/L (ref 3.5–5.1)
Total Bilirubin: 6.2 mg/dL — ABNORMAL HIGH (ref 0.3–1.2)

## 2013-05-05 LAB — RETICULOCYTES
RBC.: 2.06 MIL/uL — ABNORMAL LOW (ref 3.87–5.11)
Retic Count, Absolute: 731.3 10*3/uL — ABNORMAL HIGH (ref 19.0–186.0)

## 2013-05-05 MED ORDER — KETOROLAC TROMETHAMINE 30 MG/ML IJ SOLN
30.0000 mg | Freq: Four times a day (QID) | INTRAMUSCULAR | Status: AC | PRN
  Administered 2013-05-05 – 2013-05-06 (×3): 30 mg via INTRAVENOUS
  Filled 2013-05-05 (×4): qty 1

## 2013-05-05 MED ORDER — DIPHENHYDRAMINE HCL 50 MG/ML IJ SOLN
12.5000 mg | Freq: Four times a day (QID) | INTRAMUSCULAR | Status: DC | PRN
  Administered 2013-05-06 – 2013-05-19 (×6): 12.5 mg via INTRAVENOUS
  Filled 2013-05-05 (×6): qty 1

## 2013-05-05 MED ORDER — FOLIC ACID 1 MG PO TABS
1.0000 mg | ORAL_TABLET | Freq: Every day | ORAL | Status: DC
  Administered 2013-05-05 – 2013-05-19 (×15): 1 mg via ORAL
  Filled 2013-05-05 (×15): qty 1

## 2013-05-05 MED ORDER — HYDROMORPHONE 0.3 MG/ML IV SOLN
INTRAVENOUS | Status: DC
  Administered 2013-05-05 (×3): via INTRAVENOUS
  Filled 2013-05-05 (×3): qty 25

## 2013-05-05 MED ORDER — HYDROMORPHONE 0.3 MG/ML IV SOLN: INTRAVENOUS | Status: DC

## 2013-05-05 MED ORDER — PROMETHAZINE HCL 25 MG PO TABS
12.5000 mg | ORAL_TABLET | Freq: Four times a day (QID) | ORAL | Status: DC | PRN
  Administered 2013-05-05 – 2013-05-06 (×2): 25 mg via ORAL
  Filled 2013-05-05 (×2): qty 1

## 2013-05-05 MED ORDER — NALOXONE HCL 0.4 MG/ML IJ SOLN: 0.4000 mg | INTRAMUSCULAR | Status: DC | PRN

## 2013-05-05 MED ORDER — HYDROMORPHONE HCL PF 2 MG/ML IJ SOLN
4.0000 mg | Freq: Once | INTRAMUSCULAR | Status: AC
  Administered 2013-05-05: 4 mg via INTRAVENOUS
  Filled 2013-05-05: qty 2

## 2013-05-05 MED ORDER — SODIUM CHLORIDE 0.9 % IJ SOLN: 9.0000 mL | INTRAMUSCULAR | Status: DC | PRN

## 2013-05-05 MED ORDER — ENOXAPARIN SODIUM 40 MG/0.4ML ~~LOC~~ SOLN
40.0000 mg | SUBCUTANEOUS | Status: DC
  Administered 2013-05-05 – 2013-05-09 (×5): 40 mg via SUBCUTANEOUS
  Filled 2013-05-05 (×6): qty 0.4

## 2013-05-05 MED ORDER — POLYETHYLENE GLYCOL 3350 17 G PO PACK
17.0000 g | PACK | Freq: Every day | ORAL | Status: DC
  Administered 2013-05-05 – 2013-05-16 (×9): 17 g via ORAL
  Filled 2013-05-05 (×15): qty 1

## 2013-05-05 MED ORDER — SODIUM CHLORIDE 0.9 % IJ SOLN
10.0000 mL | INTRAMUSCULAR | Status: DC | PRN
  Administered 2013-05-17: 20 mL
  Administered 2013-05-18 – 2013-05-19 (×2): 10 mL

## 2013-05-05 MED ORDER — ONDANSETRON HCL 4 MG PO TABS: 4.0000 mg | ORAL_TABLET | ORAL | Status: DC | PRN

## 2013-05-05 MED ORDER — DEXTROSE-NACL 5-0.45 % IV SOLN
INTRAVENOUS | Status: AC
  Administered 2013-05-05 – 2013-05-06 (×2): via INTRAVENOUS

## 2013-05-05 MED ORDER — HYDROMORPHONE HCL PF 2 MG/ML IJ SOLN
1.5000 mg | INTRAMUSCULAR | Status: DC | PRN
  Administered 2013-05-05 – 2013-05-06 (×5): 1.5 mg via INTRAVENOUS
  Filled 2013-05-05 (×4): qty 1

## 2013-05-05 MED ORDER — HYDROMORPHONE 0.3 MG/ML IV SOLN
INTRAVENOUS | Status: DC
  Administered 2013-05-05 – 2013-05-06 (×3): via INTRAVENOUS
  Administered 2013-05-06: 5.99 mg via INTRAVENOUS
  Administered 2013-05-06: 3.69 mg via INTRAVENOUS
  Filled 2013-05-05 (×3): qty 25

## 2013-05-05 MED ORDER — ONDANSETRON HCL 4 MG/2ML IJ SOLN
4.0000 mg | INTRAMUSCULAR | Status: DC | PRN
  Administered 2013-05-05: 4 mg via INTRAVENOUS
  Filled 2013-05-05 (×3): qty 2

## 2013-05-05 MED ORDER — LORAZEPAM 0.5 MG PO TABS
0.5000 mg | ORAL_TABLET | Freq: Once | ORAL | Status: AC
  Administered 2013-05-05: 0.5 mg via ORAL
  Filled 2013-05-05: qty 1

## 2013-05-05 MED ORDER — SENNOSIDES-DOCUSATE SODIUM 8.6-50 MG PO TABS
1.0000 | ORAL_TABLET | Freq: Two times a day (BID) | ORAL | Status: DC
  Administered 2013-05-05 – 2013-05-18 (×19): 1 via ORAL
  Filled 2013-05-05 (×29): qty 1

## 2013-05-05 NOTE — H&P (Signed)
Patient seen ad examined. Agree with above notes.

## 2013-05-05 NOTE — Progress Notes (Signed)
[name] has PCA dilaudid ordered, at a bolus dose of 1.2 mg, lockout interval 8 mins. The PCA pump has a hard limit of 1 mg bolus dose, MD was informed and requested [place] to find a way to override the pump limit; however, at this time, no workaround has presented itself, and so the bolus dose is only 1 mg., MD has been made aware of this.

## 2013-05-05 NOTE — H&P (Signed)
Hospital Admission Note Date: 05/05/2013  Patient name: Dawn [name] Medical record number: [id number] Date of birth: 03/29/1981 Age: 32 y.o. Gender: female PCP: Marthann Schiller, MD  Attending physician: Altha Harm, MD  Chief Complaint:Pain in back and legs x 1 week  History of Present Illness: Dawn [name] is well known to me as a patient with Hb SS who is compliant with her disease management and who typically manages her symptoms of pain wellin the out patient setting with oral analgesics. She has been having increased pain typical of her pain of vaso-occlusive episodes and has received acute management in the out patient setting at the [facility]. Despite this her pain has persisted to the point that it has impaired her ambulation. Her pain is throbbing in nature and is at an intensity of 10/10. It is worse with ambulation and movement. She also has tenderness even to the slightest touch along the BLE's. She states that her oral medications decrease the pain to 9.5/10. She has had nausea but no emesis but has had poor oral intake and has been unable to effectively hydrate herself at home because of the nausea.  Her baseline pain is usually 3/10 which allows her to function at a community level. She has a baseline  WBC of 6.5, Hb of 8.5, reticulocyte count of 25% and bilirubin of 6.5.  She is being admitted for further management of Acute vaso-occlusive episode with acute hemolytic anemia and gait abnormality.  Scheduled Meds: . enoxaparin (LOVENOX) injection  40 mg Subcutaneous Q24H  . folic acid  1 mg Oral Daily  . HYDROmorphone PCA 0.3 mg/mL   Intravenous Q4H  . polyethylene glycol  17 g Oral Daily  . senna-docusate  1 tablet Oral BID   Continuous Infusions:  PRN Meds:.diphenhydrAMINE, HYDROmorphone (DILAUDID) injection, ketorolac, naloxone, ondansetron (ZOFRAN) IV, ondansetron, sodium chloride Allergies: Desferal; Latex; Lisinopril; and Tape Past Medical History  Diagnosis Date   . Sickle cell disease   . Sickle cell disease, type S   . Blood transfusion   . 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 Cholecystectomy    .  left knee acl reconstruction    . Cesarean section      x 2  . Portacath placement    . Portacath placement Left 02/23/2013    Procedure: INSERTION PORT-A-CATH;  Surgeon: Fabio Bering, MD;  Location: [facility];  Service: General;  Laterality: Left;   Family History  Problem Relation Age of Onset  . Sickle cell trait Mother   . Sickle cell trait Father   . Hypertension Father   . Diabetes Father   . Sickle cell trait Sister   . Cancer Paternal Aunt     Breast   History   Social History  . Marital Status: Single    Spouse Name: N/A    Number of Children: N/A  . Years of Education: N/A   Occupational History  . Not on file.   Social History Main Topics  . Smoking status: Never Smoker   . Smokeless tobacco: Not on file  . Alcohol Use: No  . Drug Use: No  . Sexually Active: Yes    Birth Control/ Protection: IUD   Other Topics Concern  . Not on file   Social History Narrative  . No narrative on file   Review of Systems: A comprehensive review of systems was negative except as noted in the HPI. Physical Exam: No intake or  output data in the 24 hours ending 05/05/13 1251 General: Alert, awake, oriented x3, in moderate acute distress due to pain.Marland Kitchen  HEENT: [place]/AT PEERL, EOMI, moderate icterus increased from baseline OROPHARYNX:  Tachy, No exudate/ erythema/lesions.  Heart: Tachycarida, without murmurs, rubs, gallops, PMI non-displaced, no heaves or thrills on palpation.  Lungs: Clear to auscultation, no wheezing or rhonchi noted.  Abdomen: Soft, nontender, nondistended, positive bowel sounds, no masses no hepatosplenomegaly noted.  Neuro: No focal neurological deficits noted cranial nerves II through XII grossly intact. DTRs 2+ bilaterally upper and lower extremities.  Strength normal in bilateral upper but unable to evaluate lower extremities due to pain. Musculoskeletal: No warm swelling or erythema around joints. However knees and legs tender to even superficial touch. Psychiatric: Patient alert and oriented x3, good insight and cognition, good recent to remote recall. Lymph node survey: No cervical axillary or inguinal lymphadenopathy noted.  Lab results:  Recent Labs  05/04/13 1112 05/05/13 1050  NA 136 137  K 3.9 4.2  CL 101 104  CO2 25 27  GLUCOSE 83 91  BUN 4* 7  CREATININE 0.61 0.60  CALCIUM 8.8 9.0  MG  --  1.8    Recent Labs  05/04/13 1112 05/05/13 1050  AST 88* 88*  ALT 34 31  ALKPHOS 192* 179*  BILITOT 7.3* 6.2*  PROT 7.6 7.0  ALBUMIN 3.3* 2.9*   No results found for this basename: LIPASE, AMYLASE,  in the last 72 hours  Recent Labs  05/04/13 1112 05/05/13 1050  WBC 8.5 9.0  NEUTROABS 5.6 5.1  HGB 8.4* 7.4*  HCT 22.9* 19.7*  MCV 95.8 96.1  PLT 247 201    Recent Labs  05/04/13 1112  RETICCTPCT 33.0*      A/P: Hb SS with Crisis: Pt in acute vaso-occulsive crisis. Will initiate hydration-IVF @125  ml/hr,  Weight based PCA with clinician assisted doses, IV Toradol, Superficial heat.   Gait Abnormality: I expect that as pain improves, patient's functional ability will improve. However if she has prolonged convalescence beyond 72 hours, will obtain PT consult.  Hemolytic anemia: Increased Bilirubin and LDH indicate acute hemolysis. Will monitor patient and assess for transfusion needs. 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  Dawn [name] A. 05/05/2013, 12:51 PM

## 2013-05-06 ENCOUNTER — Inpatient Hospital Stay (HOSPITAL_COMMUNITY): Payer: Medicare Other

## 2013-05-06 ENCOUNTER — Encounter (HOSPITAL_COMMUNITY): Payer: Self-pay | Admitting: *Deleted

## 2013-05-06 DIAGNOSIS — D72829 Elevated white blood cell count, unspecified: Secondary | ICD-10-CM

## 2013-05-06 LAB — CBC WITH DIFFERENTIAL/PLATELET
Basophils Absolute: 0 10*3/uL (ref 0.0–0.1)
Basophils Relative: 0 % (ref 0–1)
Eosinophils Relative: 1 % (ref 0–5)
Eosinophils Relative: 4 % (ref 0–5)
HCT: 17.4 % — ABNORMAL LOW (ref 36.0–46.0)
Hemoglobin: 6.6 g/dL — CL (ref 12.0–15.0)
Lymphs Abs: 3.6 10*3/uL (ref 0.7–4.0)
Lymphs Abs: 3.7 10*3/uL (ref 0.7–4.0)
MCH: 36.5 pg — ABNORMAL HIGH (ref 26.0–34.0)
MCH: 36.6 pg — ABNORMAL HIGH (ref 26.0–34.0)
MCV: 96.1 fL (ref 78.0–100.0)
Monocytes Absolute: 0.8 10*3/uL (ref 0.1–1.0)
Neutro Abs: 5.1 10*3/uL (ref 1.7–7.7)
Neutrophils Relative %: 61 % (ref 43–77)
Platelets: 185 10*3/uL (ref 150–400)
RBC: 1.81 MIL/uL — ABNORMAL LOW (ref 3.87–5.11)
RBC: 1.86 MIL/uL — ABNORMAL LOW (ref 3.87–5.11)
nRBC: 25 /100 WBC — ABNORMAL HIGH

## 2013-05-06 LAB — COMPREHENSIVE METABOLIC PANEL
ALT: 33 U/L (ref 0–35)
AST: 95 U/L — ABNORMAL HIGH (ref 0–37)
Alkaline Phosphatase: 166 U/L — ABNORMAL HIGH (ref 39–117)
BUN: 3 mg/dL — ABNORMAL LOW (ref 6–23)
CO2: 27 mEq/L (ref 19–32)
Calcium: 8.6 mg/dL (ref 8.4–10.5)
Creatinine, Ser: 0.56 mg/dL (ref 0.50–1.10)
GFR calc Af Amer: >90 mL/min (ref >90)
GFR calc Af Amer: >90 mL/min (ref >90)
GFR calc non Af Amer: >90 mL/min (ref >90)
Glucose, Bld: 131 mg/dL — ABNORMAL HIGH (ref 70–99)
Glucose, Bld: 86 mg/dL (ref 70–99)
Potassium: 3.6 mEq/L (ref 3.5–5.1)
Sodium: 136 mEq/L (ref 135–145)
Sodium: 134 mEq/L — ABNORMAL LOW (ref 135–145)
Total Protein: 7.2 g/dL (ref 6.0–8.3)

## 2013-05-06 LAB — URINE MICROSCOPIC-ADD ON

## 2013-05-06 LAB — LACTIC ACID, PLASMA: Lactic Acid, Venous: 1 mmol/L (ref 0.5–2.2)

## 2013-05-06 LAB — URINALYSIS, ROUTINE W REFLEX MICROSCOPIC
Ketones, ur: NEGATIVE mg/dL
Protein, ur: 100 mg/dL — AB
Urobilinogen, UA: 1 mg/dL (ref 0.0–1.0)

## 2013-05-06 MED ORDER — DEXTROSE-NACL 5-0.45 % IV SOLN
INTRAVENOUS | Status: AC
  Administered 2013-05-06: 20:00:00 via INTRAVENOUS

## 2013-05-06 MED ORDER — ACETAMINOPHEN 500 MG PO TABS
1000.0000 mg | ORAL_TABLET | Freq: Four times a day (QID) | ORAL | Status: DC | PRN
  Administered 2013-05-06: 1000 mg via ORAL
  Filled 2013-05-06: qty 2

## 2013-05-06 MED ORDER — HYDROMORPHONE HCL PF 2 MG/ML IJ SOLN
4.0000 mg | INTRAMUSCULAR | Status: DC | PRN
  Administered 2013-05-06 – 2013-05-10 (×34): 4 mg via INTRAVENOUS
  Filled 2013-05-06 (×12): qty 2
  Filled 2013-05-06: qty 1
  Filled 2013-05-06 (×23): qty 2

## 2013-05-06 MED ORDER — DIAZEPAM 5 MG PO TABS
5.0000 mg | ORAL_TABLET | Freq: Once | ORAL | Status: AC
  Administered 2013-05-06: 5 mg via ORAL
  Filled 2013-05-06: qty 1

## 2013-05-06 MED ORDER — PROMETHAZINE HCL 25 MG/ML IJ SOLN
25.0000 mg | INTRAMUSCULAR | Status: DC | PRN
  Administered 2013-05-06 – 2013-05-19 (×20): 25 mg via INTRAVENOUS
  Filled 2013-05-06 (×20): qty 1

## 2013-05-06 MED ORDER — DIAZEPAM 5 MG PO TABS
5.0000 mg | ORAL_TABLET | Freq: Four times a day (QID) | ORAL | Status: DC | PRN
  Administered 2013-05-07 – 2013-05-14 (×8): 5 mg via ORAL
  Filled 2013-05-06 (×8): qty 1

## 2013-05-06 NOTE — Discharge Summary (Signed)
Patient seen with NP Gwinda Passe. Agree with above.

## 2013-05-06 NOTE — Progress Notes (Signed)
Subjective: 32 year old female with known history of sickle cell disease admitted with sickle cell painful crisis.pain is down to 7/10 today, she is however complaining of severe muscle spasm in her lower back. She is also having nausea and vomiting at this point. No diarrhea, no fever no shortness of breath no cough. She has been on Dilaudid PCA and breakthrough Dilaudid. She's also on Toradol and folic acid. Her hydrea was held due to low hemoglobin.   Objective: Vital signs in last 24 hours: Temp:  [97.9 F (36.6 C)-99.1 F (37.3 C)] 98.1 F (36.7 C) (08/13 0514) Pulse Rate:  [80-109] 91 (08/13 0514) Resp:  [10-20] 20 (08/13 0744) BP: (113-131)/(67-83) 120/80 mmHg (08/13 0514) SpO2:  [92 %-96 %] 96 % (08/13 0744) FiO2 (%):  [92 %] 92 % (08/12 1748) Weight change:  Last BM Date: 05/04/13  Intake/Output from previous day: 08/12 0701 - 08/13 0700 In: 1491.7 [P.O.:540; I.V.:951.7] Out: -  Intake/Output this shift:    General appearance: alert, cooperative and no distress Eyes: conjunctivae/corneas clear. PERRL, EOM's intact. Fundi benign. Neck: no adenopathy, no carotid bruit, no JVD, supple, symmetrical, trachea midline and thyroid not enlarged, symmetric, no tenderness/mass/nodules Back: symmetric, no curvature. ROM normal. No CVA tenderness. Resp: clear to auscultation bilaterally Chest wall: no tenderness Cardio: regular rate and rhythm, S1, S2 normal, no murmur, click, rub or gallop GI: soft, non-tender; bowel sounds normal; no masses,  no organomegaly Extremities: extremities normal, atraumatic, no cyanosis or edema Pulses: 2+ and symmetric Skin: Skin color, texture, turgor normal. No rashes or lesions Neurologic: Grossly normal  Lab Results:  Recent Labs  05/05/13 1050 05/06/13 0345  WBC 9.0 10.0  HGB 7.4* 6.6*  HCT 19.7* 17.4*  PLT 201 161   BMET  Recent Labs  05/05/13 1050 05/06/13 0345  NA 137 136  K 4.2 3.6  CL 104 103  CO2 27 27  GLUCOSE 91 131*   BUN 7 5*  CREATININE 0.60 0.62  CALCIUM 9.0 8.4    Studies/Results: No results found.  Medications: I have reviewed the patient's current medications.  Assessment/Plan: A 32 yo woman admitted with sickle cell painful crisis and hemolytic crisis. Also nausea and vomiting.  #1 Sickle Cell Painful Crisis: Patient is on PCA pump and not getting relief when sleeping. She does not want to keep the PCA.I will therefore discontinue the PCA pump and continue with scheduled IV Dilaudid at 4 mg every 2 hours. Continue other medications.  #2 nausea and vomiting: Probably from the Dilaudid. Patient has tried Zofran on is not helping her. I will give her Phenergan which seemed to help her better. Continue IV fluids.  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 I would transfuse 1 unit of packed red blood cells today and watch her hemoglobin thereafter.  #4 muscle spasms: I will add diazepam 5 mg every 6 hours when necessary. This seemed to have helped her in the past.  LOS: 1 day   GARBA,LAWAL 05/06/2013, 10:02 AM

## 2013-05-06 NOTE — Progress Notes (Addendum)
Shift event:   Brief HPI: Dawn [name] is a 32 yo AAF admitted with sickle cell pain crisis and n/v on 05/05/13. Labs on admission revealed a total bili of 6.2 up to 7.5, Hgb 7.4 down to 6.6, and a normal WBCC. Tonight, pt was due to receive one unit PRBC transfusion but has a fever of 103F, orally. NP to bedside. S: + for nausea, mild generalized abd pain, joint pain, back pain and spasms, HA, chilling, and dry cough. Negative for chest pain, SOB, productive cough, sorethroat or other cold sx, vomiting, diarrhea, dysuria, diaphoresis, or sick contacts prior to admission.  O: Well appearing AAF in NAD. Alert and oriented x 3. Vital signs: BP stable, slightly tachycardic, normal RR, fever as above, and normal O2 sat. HEENT: no icterus or erythema to sclera, no erythema or drainage to conjunctiva, oropharynx slightly dry without exudate or erythema. No anterior cervical lymphadenopathy. CARD: S1S2 without MRG. Tachycardic. RESP: normal effort, Lungs CTA. ABD: BS + x 4. Soft, ND. Minor generalized tenderness, more in epigastric area. SKIN: warm and dry. NEURO: grossly intact. PSYCH: appropriate. Normal affect.  A/P: 1. Fever-there is no obvious source of infection on exam. Will get CBC with diff, CMP, CXR, blood cultures, UA and urine culture, and lactic acid. Tylenol after blood cx's drawn. Restart IVF D51/2 NS at 100cc/hr. Monitor for signs of sepsis.  2. Tachycardia-secondary to #1. 3. Sickle cell pain crisis-controlled with meds.  4. Sickle cell anemia-due to get transfusion. Hold until temp is controlled. Rechecking CBC.  5. N/V-none at present. Abd pain likely secondary to vomiting earlier today. Phenergan/Zofran. CMP.  At time of this note-CXR shows no acute finding. No signs of acute chest syndrome.  Will cont to follow and f/up labs/tests. Jimmye Norman, NP [facility] Update at 0323-started blood after temp down and she hasn't spiked temp during blood or shown any signs of TF reaction. Conts  with nausea, but no vomiting. Labs revealed slightly more elevated WBCC, LFTs, and total bili. Electrolytes look fine. Only one set of bld cx's drawn because pt refused 2nd set. UA with many bacteria and some blood, so given fever, Rocephin started empirically. Ordered CBC post transfusion and added retics to her earlier labs.  KJKG, NP  TR[facility] Above issues and treatment were discussed with Dr. Clayton Lefort who agrees with plan.

## 2013-05-06 NOTE — Progress Notes (Signed)
CRITICAL VALUE ALERT  Critical value received:  Hgb 6.8  Date of notification:  05/06/2013  Time of notification:  2115  Critical value read back: yes  Nurse who received alert:  Cynda Familia, RN  MD notified (1st page):  Donnamarie Poag, NP, already aware. 1 unit of PRBC's already ordered. Hgb actually up from 6.6 this morning.  Time of first page:    MD notified (2nd page):  Time of second page:  Responding MD:    Time MD responded:

## 2013-05-07 ENCOUNTER — Telehealth (HOSPITAL_COMMUNITY): Payer: Self-pay

## 2013-05-07 DIAGNOSIS — R82998 Other abnormal findings in urine: Secondary | ICD-10-CM

## 2013-05-07 DIAGNOSIS — R8271 Bacteriuria: Secondary | ICD-10-CM | POA: Insufficient documentation

## 2013-05-07 DIAGNOSIS — D571 Sickle-cell disease without crisis: Secondary | ICD-10-CM | POA: Diagnosis present

## 2013-05-07 DIAGNOSIS — R269 Unspecified abnormalities of gait and mobility: Secondary | ICD-10-CM | POA: Diagnosis present

## 2013-05-07 DIAGNOSIS — R509 Fever, unspecified: Secondary | ICD-10-CM

## 2013-05-07 LAB — RETICULOCYTES
Retic Count, Absolute: 712.7 10*3/uL — ABNORMAL HIGH (ref 19.0–186.0)
Retic Ct Pct: 35.9 % — ABNORMAL HIGH (ref 0.4–3.1)

## 2013-05-07 LAB — CBC WITH DIFFERENTIAL/PLATELET
Blasts: 0 %
Lymphocytes Relative: 44 % (ref 12–46)
Lymphs Abs: 4.7 10*3/uL — ABNORMAL HIGH (ref 0.7–4.0)
Monocytes Absolute: 0.7 10*3/uL (ref 0.1–1.0)
Monocytes Relative: 7 % (ref 3–12)
Platelets: 174 10*3/uL (ref 150–400)
Promyelocytes Absolute: 0 %
RDW: 25.1 % — ABNORMAL HIGH (ref 11.5–15.5)
WBC: 10.6 10*3/uL — ABNORMAL HIGH (ref 4.0–10.5)
nRBC: 58 /100 WBC — ABNORMAL HIGH

## 2013-05-07 LAB — LACTATE DEHYDROGENASE: LDH: 789 U/L — ABNORMAL HIGH (ref 94–250)

## 2013-05-07 MED ORDER — DEXTROSE 5 % IV SOLN
1.0000 g | Freq: Every day | INTRAVENOUS | Status: DC
  Administered 2013-05-07: 1 g via INTRAVENOUS
  Filled 2013-05-07: qty 10

## 2013-05-07 MED ORDER — DEXTROSE-NACL 5-0.45 % IV SOLN
INTRAVENOUS | Status: DC
  Administered 2013-05-07: 22:00:00 via INTRAVENOUS
  Administered 2013-05-08: 50 mL/h via INTRAVENOUS
  Administered 2013-05-09 – 2013-05-14 (×5): via INTRAVENOUS

## 2013-05-07 MED ORDER — ACETAMINOPHEN 500 MG PO TABS: 1000.0000 mg | ORAL_TABLET | Freq: Four times a day (QID) | ORAL | Status: DC | PRN

## 2013-05-07 MED ORDER — LIP MEDEX EX OINT
TOPICAL_OINTMENT | CUTANEOUS | Status: AC
  Administered 2013-05-07: 14:00:00
  Filled 2013-05-07: qty 7

## 2013-05-07 NOTE — Progress Notes (Signed)
SICKLE CELL SERVICE PROGRESS NOTE  LENNYX VERDELL EAV:[id number] DOB: 06-24-1981 DOA: 05/05/2013 PCP: [name],[name], MD  Assessment/Plan: Active Problems:   1. Hb SS with Crisis: Pt continues to have pain. She states that her pain is decreased this morning. Her spasms are not as severe as they have been. She feels that the Diazepam is helping. Pt has used Clinician assisted doses only about every 3 hours. Will continue current pain management plan and will plan to decrease dose tomorrow and schedule oral medications  if frequency remains the same. 2. Fever: Pt had  A Fever last night without any obvious source.This could be related to crisis but the possibility of a bacteremia cannot be ignored since patient has an in dwelling port. She has been started empirically on rocephin. Will discontinue and observe clinically and await cultures. 3. Bactiuria: Pt has no leukocytes in urine. Doubt infection. Will follow culture and and discontinue antibiotics. 4. Hemolytic Anemia: Pt continues to have hemolysis. Received a unit of blood yesterday. Will consider exchange transfusion if hemolysis and pain persists. 5. Gait Abnormality: Improved. Pt now able to ambulate around room without assistance.   Code Status: Full COde Family Communication: N/A Disposition Plan: Home in 48-72 hours  [name],[name]  Pager [phone]. If 7PM-7AM, please contact night-coverage.  05/07/2013, 8:15 AM  LOS: 2 days   Brief narrative: Tisa is well known to me as a patient with Hb SS who is compliant with her disease management and who typically manages her symptoms of pain wellin the out patient setting with oral analgesics. She has been having increased pain typical of her pain of vaso-occlusive episodes and has received acute management in the out patient setting at the [facility]. Despite this her pain has persisted to the point that it has impaired her ambulation. Her pain is throbbing in nature and is at an  intensity of 10/10. It is worse with ambulation and movement. She also has tenderness even to the slightest touch along the BLE's. She states that her oral medications decrease the pain to 9.5/10. She has had nausea but no emesis but has had poor oral intake and has been unable to effectively hydrate herself at home because of the nausea.   Consultants:  None  Procedures:  Transfusion 1 unit PRBC 8/13  Antibiotics:  Rocephin 8/13 >>8/14  HPI/Subjective: Pt states that she feels better. She states that her pain is decreased this morning. Her spasms are not as severe as they have been. She feels that the Diazepam is helping. Last BM 2 days ago.  Objective: Filed Vitals:   05/07/13 0230 05/07/13 0330 05/07/13 0404 05/07/13 0643  BP: 112/61 109/61 115/67 113/67  Pulse: 113 107 99 99  Temp: 98.7 F (37.1 C) 98.6 F (37 C) 98.3 F (36.8 C) 99.4 F (37.4 C)  TempSrc: Oral Oral Oral Oral  Resp: 16 16 14 16   Height:      Weight:      SpO2:    98%   Weight change:   Intake/Output Summary (Last 24 hours) at 05/07/13 0815 Last data filed at 05/07/13 0600  Gross per 24 hour  Intake   1620 ml  Output      0 ml  Net   1620 ml    General: Alert, awake, oriented x3, in no acute distress. Sitting on side of bread eating breakfast. HEENT: [place]/AT PEERL, EOMI, decreased icterus since admission. OROPHARYNX:  Moist, No exudate/ erythema/lesions.  Heart: Regular rate and rhythm, without  murmurs, rubs, .  Lungs: Clear to auscultation, no wheezing or rhonchi noted.  Abdomen: Soft, nontender, nondistended, positive bowel sounds, no masses no hepatosplenomegaly noted.  Neuro: No focal neurological deficits noted cranial nerves II through XII grossly intact. Strength functio in bilateral upper and lower extremities. Musculoskeletal: No warm swelling or erythema around joints, no spinal tenderness noted. Psychiatric: Patient alert and oriented x3, good insight and cognition, good recent to  remote recall. Lymph node survey: No cervical axillary or inguinal lymphadenopathy noted.   Data Reviewed: Basic Metabolic Panel:  Recent Labs Lab 05/01/13 1415 05/04/13 1112 05/05/13 1050 05/06/13 0345 05/06/13 2040  NA 139 136 137 136 134*  K 3.8 3.9 4.2 3.6 4.5  CL 103 101 104 103 103  CO2 24 25 27 27 25   GLUCOSE 79 83 91 131* 86  BUN 6 4* 7 5* 3*  CREATININE 0.56 0.61 0.60 0.62 0.56  CALCIUM 9.0 8.8 9.0 8.4 8.6  MG  --   --  1.8  --   --    Liver Function Tests:  Recent Labs Lab 05/01/13 1415 05/04/13 1112 05/05/13 1050 05/06/13 0345 05/06/13 2040  AST 80* 88* 88* 95* 120*  ALT 29 34 31 33 37*  ALKPHOS 201* 192* 179* 166* 179*  BILITOT 7.3* 7.3* 6.2* 7.5* 9.7*  PROT 8.0 7.6 7.0 6.7 7.2  ALBUMIN 3.5 3.3* 2.9* 2.7* 3.0*   No results found for this basename: LIPASE, AMYLASE,  in the last 168 hours No results found for this basename: AMMONIA,  in the last 168 hours CBC:  Recent Labs Lab 05/04/13 1112 05/05/13 1050 05/06/13 0345 05/06/13 2040 05/07/13 0650  WBC 8.5 9.0 10.0 12.8* 10.6*  NEUTROABS 5.6 5.1 5.1 7.8* 5.1  HGB 8.4* 7.4* 6.6* 6.8* 7.3*  HCT 22.9* 19.7* 17.4* 17.8* 19.5*  MCV 95.8 96.1 96.1 95.7 93.3  PLT 247 201 161 185 174   Studies: Dg Chest Port 1 View  05/06/2013   *RADIOLOGY REPORT*  Clinical Data: Fever and cough.  Sickle cell.  PORTABLE CHEST - 1 VIEW  Comparison: 02/23/2013  Findings: Left subclavian PowerPort remains in place, with a kink in the  tubing near the   costoclavicular ligament suggesting increased risk of pinch-off syndrome.  Lungs clear.  Stable mild cardiomegaly.  No definite effusion.  Sclerotic changes in bilateral humeral heads suggesting AVN.  IMPRESSION:  1.  Stable mild cardiomegaly.   Original Report Authenticated By: D. Andria Rhein, MD    Scheduled Meds: . cefTRIAXone (ROCEPHIN)  IV  1 g Intravenous QHS  . enoxaparin (LOVENOX) injection  40 mg Subcutaneous Q24H  . folic acid  1 mg Oral Daily  . polyethylene  glycol  17 g Oral Daily  . senna-docusate  1 tablet Oral BID   Continuous Infusions: . dextrose 5 % and 0.45% NaCl 100 mL/hr at 05/06/13 2018    Time spent 40 minutes

## 2013-05-07 NOTE — Telephone Encounter (Signed)
This CM spoke with Claris Gladden with [facility] specialty pharmacy who advised he has attempt to reach patient 4 times to refill her Ejade prescription, however no response from patient. This CM will verify with provider and patient regarding her current need for Exjade. Claris Gladden will not make any additional contacts     Karoline Caldwell, RN, BSN ,[place]    [phone]

## 2013-05-07 NOTE — Progress Notes (Signed)
Pt to receive 1 unit of PRBC's tonight for Hgb of 6.6 this morning but spiked a temp of 103 orally around 1930 tonight. On-call provider notified. Orders received for blood cx's x 2 sets. Pt refused to have a second set drawn after lab had some difficulty sticking the pt. Donnamarie Poag, NP, notified about this, and the 2nd set of blood cx's was cancelled. Pt then received 1gm of Tylenol PO for fever at 2055. Will continue to monitor. Per NP, will not transfuse until temp <100F.

## 2013-05-08 LAB — TYPE AND SCREEN: DAT, IgG: NEGATIVE

## 2013-05-08 LAB — BASIC METABOLIC PANEL
Calcium: 8.4 mg/dL (ref 8.4–10.5)
GFR calc Af Amer: >90 mL/min (ref >90)
GFR calc non Af Amer: >90 mL/min (ref >90)
Glucose, Bld: 122 mg/dL — ABNORMAL HIGH (ref 70–99)
Potassium: 3.4 mEq/L — ABNORMAL LOW (ref 3.5–5.1)
Sodium: 139 mEq/L (ref 135–145)

## 2013-05-08 LAB — CBC WITH DIFFERENTIAL/PLATELET
Basophils Absolute: 0.1 10*3/uL (ref 0.0–0.1)
Eosinophils Absolute: 0.2 10*3/uL (ref 0.0–0.7)
HCT: 20.5 % — ABNORMAL LOW (ref 36.0–46.0)
Hemoglobin: 7.6 g/dL — ABNORMAL LOW (ref 12.0–15.0)
Lymphocytes Relative: 33 % (ref 12–46)
Monocytes Relative: 17 % — ABNORMAL HIGH (ref 3–12)
Neutro Abs: 4.2 10*3/uL (ref 1.7–7.7)
Neutrophils Relative %: 47 % (ref 43–77)
RDW: 27 % — ABNORMAL HIGH (ref 11.5–15.5)
WBC: 9 10*3/uL (ref 4.0–10.5)

## 2013-05-08 LAB — LACTATE DEHYDROGENASE: LDH: 809 U/L — ABNORMAL HIGH (ref 94–250)

## 2013-05-08 NOTE — Progress Notes (Signed)
Subjective: A 32 yo admitted with sickle cell painful crisis. Pain is now at 6/10. On Dilaudid now getting better. Has no nausea no vomiting. Has some tiredness. No evidence of active hemolytic reaction. No muscle spasm today. She is able to move around without difficulties. She however believes that her pain is still not controlled to where she can function effectively.   Objective: Vital signs in last 24 hours: Temp:  [98.3 F (36.8 C)-99.2 F (37.3 C)] 98.3 F (36.8 C) (08/15 1014) Pulse Rate:  [97-113] 97 (08/15 1014) Resp:  [16-18] 18 (08/15 1014) BP: (103-136)/(67-88) 103/67 mmHg (08/15 1014) SpO2:  [94 %-99 %] 99 % (08/15 1014) Weight change:  Last BM Date: 05/04/13  Intake/Output from previous day: 08/14 0701 - 08/15 0700 In: 350 [I.V.:350] Out: -  Intake/Output this shift:    General appearance: alert, cooperative and no distress Eyes: conjunctivae/corneas clear. PERRL, EOM's intact. Fundi benign. Neck: no adenopathy, no carotid bruit, no JVD, supple, symmetrical, trachea midline and thyroid not enlarged, symmetric, no tenderness/mass/nodules Back: symmetric, no curvature. ROM normal. No CVA tenderness. Resp: clear to auscultation bilaterally Chest wall: no tenderness Cardio: regular rate and rhythm, S1, S2 normal, no murmur, click, rub or gallop GI: soft, non-tender; bowel sounds normal; no masses,  no organomegaly Extremities: extremities normal, atraumatic, no cyanosis or edema Pulses: 2+ and symmetric Skin: Skin color, texture, turgor normal. No rashes or lesions Neurologic: Grossly normal  Lab Results:  Recent Labs  05/07/13 0650 05/08/13 0525  WBC 10.6* 9.0  HGB 7.3* 7.6*  HCT 19.5* 20.5*  PLT 174 191   BMET  Recent Labs  05/06/13 2040 05/08/13 0525  NA 134* 139  K 4.5 3.4*  CL 103 104  CO2 25 28  GLUCOSE 86 122*  BUN 3* 3*  CREATININE 0.56 0.53  CALCIUM 8.6 8.4    Studies/Results: Dg Chest Port 1 View  05/06/2013   *RADIOLOGY REPORT*   Clinical Data: Fever and cough.  Sickle cell.  PORTABLE CHEST - 1 VIEW  Comparison: 02/23/2013  Findings: Left subclavian PowerPort remains in place, with a kink in the  tubing near the   costoclavicular ligament suggesting increased risk of pinch-off syndrome.  Lungs clear.  Stable mild cardiomegaly.  No definite effusion.  Sclerotic changes in bilateral humeral heads suggesting AVN.  IMPRESSION:  1.  Stable mild cardiomegaly.   Original Report Authenticated By: D. Andria Rhein, MD    Medications: I have reviewed the patient's current medications.  Assessment/Plan: A 32 yo woman admitted with sickle cell painful crisis and hemolytic crisis. Also nausea and vomiting.  #1 Sickle Cell Painful Crisis: Patient is off Dilaudid PCA. She is getting Dilaudid IV as needed. Also getting her Toradol. We'll continue with her current medications.  #2 nausea and vomiting: Patient is currently on Phenergan which seemed to help her better. Continue IV fluids.  #3 sickle cell hemolytic anemia: Her hemoglobin is stable now. We will continue to monitor.  #4 muscle spasms: This seems to be doing better since she started taking the Valium..  LOS: 3 days   GARBA,LAWAL 05/08/2013, 11:21 AM

## 2013-05-09 DIAGNOSIS — M62830 Muscle spasm of back: Secondary | ICD-10-CM | POA: Diagnosis present

## 2013-05-09 DIAGNOSIS — M5380 Other specified dorsopathies, site unspecified: Secondary | ICD-10-CM

## 2013-05-09 DIAGNOSIS — I10 Essential (primary) hypertension: Secondary | ICD-10-CM

## 2013-05-09 DIAGNOSIS — R112 Nausea with vomiting, unspecified: Secondary | ICD-10-CM | POA: Diagnosis present

## 2013-05-09 LAB — URINE CULTURE
Colony Count: >=100000
Special Requests: NORMAL

## 2013-05-09 LAB — HEMOGLOBIN AND HEMATOCRIT, BLOOD: HCT: 23.4 % — ABNORMAL LOW (ref 36.0–46.0)

## 2013-05-09 MED ORDER — HYDROXYUREA 500 MG PO CAPS
1000.0000 mg | ORAL_CAPSULE | ORAL | Status: DC
  Administered 2013-05-09 – 2013-05-19 (×6): 1000 mg via ORAL
  Filled 2013-05-09 (×6): qty 2

## 2013-05-09 MED ORDER — POTASSIUM CHLORIDE CRYS ER 20 MEQ PO TBCR
40.0000 meq | EXTENDED_RELEASE_TABLET | Freq: Once | ORAL | Status: AC
  Administered 2013-05-09: 40 meq via ORAL
  Filled 2013-05-09: qty 2

## 2013-05-09 MED ORDER — OXYCODONE HCL 5 MG PO TABS
10.0000 mg | ORAL_TABLET | ORAL | Status: DC | PRN
Start: 2013-05-09 — End: 2013-05-12
  Administered 2013-05-10: 10 mg via ORAL
  Filled 2013-05-09: qty 2

## 2013-05-09 MED ORDER — HYDROXYUREA 500 MG PO CAPS
500.0000 mg | ORAL_CAPSULE | ORAL | Status: DC
  Administered 2013-05-10 – 2013-05-18 (×5): 500 mg via ORAL
  Filled 2013-05-09 (×7): qty 1

## 2013-05-09 MED ORDER — DEFERASIROX 500 MG PO TBSO: 1500.0000 mg | ORAL_TABLET | Freq: Every day | ORAL | Status: DC

## 2013-05-09 MED ORDER — OXYCODONE HCL 10 MG PO TABS: 10.0000 mg | ORAL_TABLET | ORAL | Status: DC | PRN

## 2013-05-09 MED ORDER — SALINE SPRAY 0.65 % NA SOLN
1.0000 | NASAL | Status: DC | PRN
  Filled 2013-05-09: qty 44

## 2013-05-09 NOTE — Progress Notes (Signed)
Subjective: Patient is still having a 6/10 pain today. No fever no cough no shortness of breath. She has been frequenting the bathroom but otherwise feels much better. Pain is confined to her lower back and lower legs. She is still not on any oral agents only the IV Dilaudid, Toradol and Tylenol.  Objective: Vital signs in last 24 hours: Temp:  [98.5 F (36.9 C)-99 F (37.2 C)] 98.5 F (36.9 C) (08/16 0556) Pulse Rate:  [97-100] 97 (08/16 0556) Resp:  [20] 20 (08/16 0556) BP: (99-119)/(62-79) 99/62 mmHg (08/16 0556) SpO2:  [93 %-96 %] 93 % (08/16 0556) Weight change:  Last BM Date: 05/08/13  Intake/Output from previous day: 08/15 0701 - 08/16 0700 In: 1537.5 [P.O.:240; I.V.:1297.5] Out: -  Intake/Output this shift:    General appearance: alert, cooperative and no distress Eyes: conjunctivae/corneas clear. PERRL, EOM's intact. Fundi benign. Neck: no adenopathy, no carotid bruit, no JVD, supple, symmetrical, trachea midline and thyroid not enlarged, symmetric, no tenderness/mass/nodules Back: symmetric, no curvature. ROM normal. No CVA tenderness. Resp: clear to auscultation bilaterally Chest wall: no tenderness Cardio: regular rate and rhythm, S1, S2 normal, no murmur, click, rub or gallop GI: soft, non-tender; bowel sounds normal; no masses,  no organomegaly Extremities: extremities normal, atraumatic, no cyanosis or edema Pulses: 2+ and symmetric Skin: Skin color, texture, turgor normal. No rashes or lesions Neurologic: Grossly normal  Lab Results:  Recent Labs  05/07/13 0650 05/08/13 0525  WBC 10.6* 9.0  HGB 7.3* 7.6*  HCT 19.5* 20.5*  PLT 174 191   BMET  Recent Labs  05/06/13 2040 05/08/13 0525  NA 134* 139  K 4.5 3.4*  CL 103 104  CO2 25 28  GLUCOSE 86 122*  BUN 3* 3*  CREATININE 0.56 0.53  CALCIUM 8.6 8.4    Studies/Results: No results found.  Medications: I have reviewed the patient's current medications.  Assessment/Plan: A 32 yo woman  admitted with sickle cell painful crisis and hemolytic crisis. Also nausea and vomiting.  #1 Sickle Cell Painful Crisis: Still having pain at 6/10. I will restart her oral medications so we can start titrating off the IV medications.  #2 nausea and vomiting: This has resolved. We'll continue when necessary Phenergan.  #3 sickle cell hemolytic anemia: H&H seems stable.  #4 muscle spasms: Resolved.  #5 hypokalemia: We will replete her potassium once again.  LOS: 4 days   Griffyn Kucinski,LAWAL 05/09/2013, 1:09 PM

## 2013-05-10 ENCOUNTER — Inpatient Hospital Stay (HOSPITAL_COMMUNITY): Payer: Medicare Other

## 2013-05-10 DIAGNOSIS — R042 Hemoptysis: Secondary | ICD-10-CM | POA: Diagnosis not present

## 2013-05-10 DIAGNOSIS — F4321 Adjustment disorder with depressed mood: Secondary | ICD-10-CM

## 2013-05-10 LAB — CBC WITH DIFFERENTIAL/PLATELET
Basophils Absolute: 0.1 10*3/uL (ref 0.0–0.1)
Eosinophils Absolute: 0.1 10*3/uL (ref 0.0–0.7)
Eosinophils Relative: 1 % (ref 0–5)
Lymphocytes Relative: 25 % (ref 12–46)
Monocytes Relative: 22 % — ABNORMAL HIGH (ref 3–12)
Neutro Abs: 4.1 10*3/uL (ref 1.7–7.7)
Neutrophils Relative %: 51 % (ref 43–77)
Platelets: 187 10*3/uL (ref 150–400)
RBC: 2.24 MIL/uL — ABNORMAL LOW (ref 3.87–5.11)
RDW: 25.5 % — ABNORMAL HIGH (ref 11.5–15.5)
WBC: 8.1 10*3/uL (ref 4.0–10.5)

## 2013-05-10 MED ORDER — HYDROMORPHONE HCL PF 2 MG/ML IJ SOLN
2.0000 mg | INTRAMUSCULAR | Status: DC | PRN
  Administered 2013-05-10 – 2013-05-14 (×31): 2 mg via INTRAVENOUS
  Filled 2013-05-10 (×15): qty 1
  Filled 2013-05-10: qty 2
  Filled 2013-05-10: qty 11
  Filled 2013-05-10 (×13): qty 1

## 2013-05-10 MED ORDER — IBUPROFEN 800 MG PO TABS
800.0000 mg | ORAL_TABLET | Freq: Three times a day (TID) | ORAL | Status: DC
  Administered 2013-05-10 – 2013-05-19 (×25): 800 mg via ORAL
  Filled 2013-05-10 (×34): qty 1

## 2013-05-10 NOTE — Progress Notes (Signed)
Notified Dr.Garba because patient c/o mid sternal chest pain 7/10 states its just hurts. Patient admits to feeling anxious and is tearful. EKG done per MD request and resulted NSR

## 2013-05-10 NOTE — Progress Notes (Signed)
Patient very depressed and tearful throughout my shift denies any thoughts of harming herself.  Patient states she broke up with her boyfriend, emotional support given will continue to monitor.

## 2013-05-10 NOTE — Progress Notes (Signed)
Subjective: Patient is still having a 6/10 pain today. She was upset by some family issues and is in tears. Denies any suicidal or homicidal ideation. She has continued to have some hemoptysis on and off. No fever, no NVD. She has been off Dilaudid PCA and is now on IV Dilaudid 4mg  Q 4hours. No other complaints however. No fever, no NVD.  Objective: Vital signs in last 24 hours: Temp:  [98.7 F (37.1 C)-99.5 F (37.5 C)] 99.4 F (37.4 C) (08/17 0541) Pulse Rate:  [99-109] 108 (08/17 0541) Resp:  [16-18] 18 (08/17 0541) BP: (121-134)/(82-84) 129/83 mmHg (08/17 0541) SpO2:  [89 %-93 %] 90 % (08/17 0541) Weight change:  Last BM Date: 05/09/13  Intake/Output from previous day:   Intake/Output this shift:    General appearance: alert, cooperative and no distress Eyes: conjunctivae/corneas clear. PERRL, EOM's intact. Fundi benign. Neck: no adenopathy, no carotid bruit, no JVD, supple, symmetrical, trachea midline and thyroid not enlarged, symmetric, no tenderness/mass/nodules Back: symmetric, no curvature. ROM normal. No CVA tenderness. Resp: clear to auscultation bilaterally Chest wall: no tenderness Cardio: regular rate and rhythm, S1, S2 normal, no murmur, click, rub or gallop GI: soft, non-tender; bowel sounds normal; no masses,  no organomegaly Extremities: extremities normal, atraumatic, no cyanosis or edema Pulses: 2+ and symmetric Skin: Skin color, texture, turgor normal. No rashes or lesions Neurologic: Grossly normal  Lab Results:  Recent Labs  05/08/13 0525 05/09/13 2149 05/10/13 0414  WBC 9.0  --  8.1  HGB 7.6* 8.3* 7.9*  HCT 20.5* 23.4* 21.8*  PLT 191  --  187   BMET  Recent Labs  05/08/13 0525  NA 139  K 3.4*  CL 104  CO2 28  GLUCOSE 122*  BUN 3*  CREATININE 0.53  CALCIUM 8.4    Studies/Results: No results found.  Medications: I have reviewed the patient's current medications.  Assessment/Plan: A 32 yo woman admitted with sickle cell painful  crisis and hemolytic crisis. Also nausea and vomiting.  #1 Sickle Cell Painful Crisis: Still having pain at 6/10. She however is getting relief from the medication for 2 hours after taking it. I will therefore not make any changes today but continue to encourage her to move out of the room and mobilize herself.  #2 nausea and vomiting: This has resolved. Phenergan as needed.  #3 sickle cell hemolytic anemia: H&H has dropped a little. Will monitor. No evidence of active hemolysis.  #4 Depression: Counseling provided. Antidepressants ordered.  #5 hypokalemia: Repleted. Follow the level.  LOS: 5 days   GARBA,LAWAL 05/10/2013, 12:37 PM

## 2013-05-11 LAB — CBC WITH DIFFERENTIAL/PLATELET
Band Neutrophils: 0 % (ref 0–10)
Basophils Absolute: 0 10*3/uL (ref 0.0–0.1)
Blasts: 0 %
HCT: 20.2 % — ABNORMAL LOW (ref 36.0–46.0)
MCHC: 35.6 g/dL (ref 30.0–36.0)
MCV: 97.1 fL (ref 78.0–100.0)
Metamyelocytes Relative: 0 %
Monocytes Absolute: 0.6 10*3/uL (ref 0.1–1.0)
Promyelocytes Absolute: 0 %
RDW: 23.5 % — ABNORMAL HIGH (ref 11.5–15.5)

## 2013-05-11 MED ORDER — MIRTAZAPINE 7.5 MG PO TABS
7.5000 mg | ORAL_TABLET | Freq: Every day | ORAL | Status: DC
  Administered 2013-05-11 – 2013-05-18 (×8): 7.5 mg via ORAL
  Filled 2013-05-11 (×10): qty 1

## 2013-05-11 MED ORDER — ESCITALOPRAM OXALATE 5 MG PO TABS
5.0000 mg | ORAL_TABLET | Freq: Every day | ORAL | Status: DC
  Administered 2013-05-11 – 2013-05-19 (×9): 5 mg via ORAL
  Filled 2013-05-11 (×9): qty 1

## 2013-05-11 NOTE — Clinical Documentation Improvement (Signed)
THIS DOCUMENT IS NOT A PERMANENT PART OF THE MEDICAL RECORD  Please update your documentation with the medical record to reflect your response to this query. If you need help knowing how to do this please call [phone].  05/11/13  Dear [name], M/Associates,  In a better effort to capture your patient's severity of illness, reflect appropriate length of stay and utilization of resources, a review of the patient medical record has revealed the following indicators.    Based on your clinical judgment, please clarify and document in a progress note and/or discharge summary the clinical condition associated with the following supporting information:  In responding to this query please exercise your independent judgment.  The fact that a query is asked, does not imply that any particular answer is desired or expected.    Pt with possible Bacteremia   Clarification Needed   Please clarify if bacteremia in setting of indwelling port can be further specified as one of the diagnoses listed below and document in pn or d/c summary.    Pt with Possible   Possible Clinical Conditions?  Septicemia / Sepsis Severe Sepsis Neutropenic Sepsis  SIRS Septic Shock Sepsis with UTI Sepsis due to an internal device (  ) Bacterial infection of unknown etiology / source Other Condition __________________ Cannot clinically Determine _______________  Risk Factors:  Presenting Signs and Symptoms: PN: 05/07/13  possibility of a bacteremia cannot be ignored since patient has an in dwelling port.   Diagnostics: Urine Culture:  >=100,000 COLONIES/ML  Component      WBC  Latest Ref Rng      4.0 - 10.5 K/uL  05/06/2013     8:40 PM 12.8 (H)   Component      WBC  Latest Ref Rng      4.0 - 10.5 K/uL  05/07/2013      10.6 (H)   Component      Colony Count Culture  Latest Ref Rng         05/06/2013     8:24 PM >=100,000 COLONIES/ML . . . ENTEROCOCCUS SPECIES . . .   Component  WBC Morphology  Latest Ref Rng        05/06/2013     3:45 AM MILD LEFT SHIFT (1-5% METAS, OCC MYELO, OCC BANDS)   Component      WBC Morphology  Latest Ref Rng        05/06/2013     8:40 PM INCREASED BANDS (>20% BANDS)   Temps 100.7 100.8 103  Pulse: 105 117 119  Treatment:  Reviewed:  no additional documentation provided ljh  Thank You,  Enis Slipper  RN, BSN, MSN/Inf, CCDS Clinical Documentation Specialist [facility] Pager: [phone] / E-mail: [email or web address]  [phone] Health Information Management [place]

## 2013-05-11 NOTE — Consult Note (Signed)
Reason for Consult: depression, needs medication management Referring Physician: Rometta Emery, MD   Dawn [name] is an 32 y.o. female.  HPI: Patient is seen and chart reviewed. Patient reported she has been suffering with genotype Hemoglobin SS SCD and having a crisis every 2 months. She has history of depression after her baby was born patient reportedly was taken medication but does not remember at this time. She has 32 years old girl. Patient stated she was divorced from her husband. Patient mother and father are supportive to her.  patient endorses symptoms of depression, anxiety disturbance of sleep and appetite. Patient denies recent onset ideations intents or plans. She is no evidence of psychotic symptoms patient is willing to give a trial for antidepressant medication and provided verbal consent after brief discussion about risks and benefits.   Mental Status Examination: Patient appeared as per his stated age, sitting on the side of the bed and talking on the phone with family members and tearful. Patient has been wearing eye glasses and has good eye contact. Patient has  depressed  mood and his affect was  sagittal and constricted. He has normal rate, rhythm, and  low volume of speech. His thought process is linear and goal directed. Patient has denied suicidal, homicidal ideations, intentions or plans. Patient has no evidence of auditory or visual hallucinations, delusions, and paranoia. Patient has fair insight judgment and impulse control.  Past Medical History  Diagnosis Date  . Sickle cell disease   . Sickle cell disease, type S   . Blood transfusion   . 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 Cholecystectomy    .  left knee acl reconstruction    . Cesarean section      x 2  . Portacath placement    . Portacath placement Left 02/23/2013    Procedure: INSERTION PORT-A-CATH;  Surgeon: Fabio Bering, MD;  Location: [facility];  Service: General;  Laterality: Left;    Family History  Problem Relation Age of Onset  . Sickle cell trait Mother   . Sickle cell trait Father   . Hypertension Father   . Diabetes Father   . Sickle cell trait Sister   . Cancer Paternal Aunt     Breast    Social History:  reports that she has never smoked. She does not have any smokeless tobacco history on file. She reports that she does not drink alcohol or use illicit drugs.  Allergies:  Allergies  Allergen Reactions  . Desferal [Deferoxamine] Hives    Local reaction on arm only during infusion. Can take with benadryl   . Latex Other (See Comments)    REACTION: Pt experiences a burning sensation on contacted skin areas  . Lisinopril Other (See Comments) and Cough    REACTION: Sore/scratchy throat  . Tape Other (See Comments)    REACTION: Pt. Experiences a burning sensation on contacted skin areas    Medications: I have reviewed the patient's current medications.  Results for orders placed during the hospital encounter of 05/05/13 (from the past 48 hour(s))  HEMOGLOBIN AND HEMATOCRIT, BLOOD     Status: Abnormal   Collection Time    05/09/13  9:49 PM      Result Value Range   Hemoglobin 8.3 (*) 12.0 - 15.0 g/dL   HCT 16.1 (*) 09.6 - 04.5 %  CBC WITH DIFFERENTIAL     Status: Abnormal   Collection Time  05/10/13  4:14 AM      Result Value Range   WBC 8.1  4.0 - 10.5 K/uL   Comment: WHITE COUNT CONFIRMED ON SMEAR     ADJUSTED FOR NUCLEATED RBC'S   RBC 2.24 (*) 3.87 - 5.11 MIL/uL   Hemoglobin 7.9 (*) 12.0 - 15.0 g/dL   HCT 29.5 (*) 28.4 - 13.2 %   MCV 97.3  78.0 - 100.0 fL   MCH 35.3 (*) 26.0 - 34.0 pg   MCHC 36.2 (*) 30.0 - 36.0 g/dL   RDW 44.0 (*) 10.2 - 72.5 %   Platelets 187  150 - 400 K/uL   Comment: REPEATED TO VERIFY     SPECIMEN CHECKED FOR CLOTS     PLATELET COUNT CONFIRMED BY SMEAR     LARGE PLATELETS PRESENT   Neutrophils Relative % 51  43 - 77 %   Lymphocytes Relative 25  12 - 46 %   Monocytes  Relative 22 (*) 3 - 12 %   Eosinophils Relative 1  0 - 5 %   Basophils Relative 1  0 - 1 %   nRBC 60 (*) 0 /100 WBC   Neutro Abs 4.1  1.7 - 7.7 K/uL   Lymphs Abs 2.0  0.7 - 4.0 K/uL   Monocytes Absolute 1.8 (*) 0.1 - 1.0 K/uL   Eosinophils Absolute 0.1  0.0 - 0.7 K/uL   Basophils Absolute 0.1  0.0 - 0.1 K/uL   RBC Morphology MARKED POLYCHROMASIA     Comment: TARGET CELLS     HOWELL/JOLLY BODIES     SICKLE CELLS  CBC WITH DIFFERENTIAL     Status: Abnormal   Collection Time    05/11/13  4:45 AM      Result Value Range   WBC 6.3  4.0 - 10.5 K/uL   Comment: WHITE COUNT CONFIRMED ON SMEAR     ADJUSTED FOR NUCLEATED RBC'S   RBC 2.08 (*) 3.87 - 5.11 MIL/uL   Hemoglobin 7.2 (*) 12.0 - 15.0 g/dL   HCT 36.6 (*) 44.0 - 34.7 %   MCV 97.1  78.0 - 100.0 fL   MCH 34.6 (*) 26.0 - 34.0 pg   MCHC 35.6  30.0 - 36.0 g/dL   RDW 42.5 (*) 95.6 - 38.7 %   Platelets 141 (*) 150 - 400 K/uL   Comment: SPECIMEN CHECKED FOR CLOTS     REPEATED TO VERIFY     DELTA CHECK NOTED     PLATELET COUNT CONFIRMED BY SMEAR   Neutrophils Relative % 51  43 - 77 %   Lymphocytes Relative 38  12 - 46 %   Monocytes Relative 9  3 - 12 %   Eosinophils Relative 2  0 - 5 %   Basophils Relative 0  0 - 1 %   Band Neutrophils 0  0 - 10 %   Metamyelocytes Relative 0     Myelocytes 0     Promyelocytes Absolute 0     Blasts 0     nRBC 17 (*) 0 /100 WBC   Neutro Abs 3.2  1.7 - 7.7 K/uL   Lymphs Abs 2.4  0.7 - 4.0 K/uL   Monocytes Absolute 0.6  0.1 - 1.0 K/uL   Eosinophils Absolute 0.1  0.0 - 0.7 K/uL   Basophils Absolute 0.0  0.0 - 0.1 K/uL   RBC Morphology POLYCHROMASIA PRESENT     Comment: TARGET CELLS     HOWELL/JOLLY BODIES     SICKLE CELLS  Dg Chest 2 View  05/10/2013   *RADIOLOGY REPORT*  Clinical Data: Fever and cough; sickle cell disease  CHEST - 2 VIEW  Comparison: May 06, 2013  Findings: Central catheter tip is in the superior vena cava.  No pneumothorax.  There is minimal bibasilar lung scarring.  Lungs  are otherwise clear.  Heart size and pulmonary vascularity are normal. No adenopathy.  Bony structures appear intact.  IMPRESSION: Edema or consolidation.  Minimal bibasilar lung scarring.   Original Report Authenticated By: Bretta Bang, M.D.    Positive for anorexia, anxiety, bad mood, depression, mood swings and sleep disturbance Blood pressure 104/68, pulse 100, temperature 98.9 F (37.2 C), temperature source Oral, resp. rate 18, height 5\' 3"  (1.6 m), weight 71 kg (156 lb 8.4 oz), SpO2 91.00%.   Assessment/Plan: Depressive disorder secondary to general medical condition  Recommendation: 1. Start Lexapro 5 mg daily by mouth for depression 2. Start Remeron 7. 5 mg at bedtime for better sleep and appetite 3. Monitor for that specific other medications 4. Appreciate psychiatric consultation and followup as needed  Nehemiah Settle., M.D. 05/11/2013, 5:53 PM

## 2013-05-12 DIAGNOSIS — F329 Major depressive disorder, single episode, unspecified: Secondary | ICD-10-CM

## 2013-05-12 DIAGNOSIS — B952 Enterococcus as the cause of diseases classified elsewhere: Secondary | ICD-10-CM

## 2013-05-12 DIAGNOSIS — N390 Urinary tract infection, site not specified: Secondary | ICD-10-CM

## 2013-05-12 LAB — COMPREHENSIVE METABOLIC PANEL
AST: 92 U/L — ABNORMAL HIGH (ref 0–37)
Albumin: 2.7 g/dL — ABNORMAL LOW (ref 3.5–5.2)
Chloride: 105 mEq/L (ref 96–112)
Creatinine, Ser: 0.53 mg/dL (ref 0.50–1.10)
Potassium: 4 mEq/L (ref 3.5–5.1)
Total Bilirubin: 6.9 mg/dL — ABNORMAL HIGH (ref 0.3–1.2)
Total Protein: 7 g/dL (ref 6.0–8.3)

## 2013-05-12 LAB — LACTATE DEHYDROGENASE: LDH: 720 U/L — ABNORMAL HIGH (ref 94–250)

## 2013-05-12 MED ORDER — OXYCODONE HCL 5 MG PO TABS
10.0000 mg | ORAL_TABLET | ORAL | Status: DC
  Administered 2013-05-12 – 2013-05-16 (×27): 10 mg via ORAL
  Administered 2013-05-17: 5 mg via ORAL
  Administered 2013-05-17 – 2013-05-18 (×6): 10 mg via ORAL
  Filled 2013-05-12 (×8): qty 2
  Filled 2013-05-12: qty 1
  Filled 2013-05-12 (×11): qty 2
  Filled 2013-05-12: qty 1
  Filled 2013-05-12 (×8): qty 2
  Filled 2013-05-12 (×2): qty 1
  Filled 2013-05-12 (×5): qty 2

## 2013-05-12 MED ORDER — AMPICILLIN 250 MG PO CAPS
250.0000 mg | ORAL_CAPSULE | Freq: Four times a day (QID) | ORAL | Status: DC
  Administered 2013-05-12 – 2013-05-13 (×6): 250 mg via ORAL
  Filled 2013-05-12 (×8): qty 1

## 2013-05-12 NOTE — Progress Notes (Signed)
Clinical Social Work Department CLINICAL SOCIAL WORK PSYCHIATRY SERVICE LINE ASSESSMENT 05/12/2013  Patient:  Dawn [name]  Account:  [id number]  Admit Date:  05/05/2013  Clinical Social Worker:  Unk Lightning, LCSW  Date/Time:  05/12/2013 01:15 PM Referred by:  Physician  Date referred:  05/12/2013 Reason for Referral  Psychosocial assessment   Presenting Symptoms/Problems (In the person's/family's own words):   Psych consulted due to depression   Abuse/Neglect/Trauma History (check all that apply)  Witness to trauma   Abuse/Neglect/Trauma Comments:   Patient had a child pass away.   Psychiatric History (check all that apply)  Outpatient treatment   Psychiatric medications:  Lexapro 5 mg  Remeron 7.5 mg   Current Mental Health Hospitalizations/Previous Mental Health History:   Patient reports she has been feeling depressed for several years and feels that child's death triggered depression. After baby passed away, patient went to support groups but no follow up or medication management in several years.   Current provider:   None   Place and Date:   N/A   Current Medications:   acetaminophen, diazepam, diphenhydrAMINE, HYDROmorphone (DILAUDID) injection, naloxone, ondansetron (ZOFRAN) IV, ondansetron, promethazine, promethazine, sodium chloride, sodium chloride, sodium chloride            . ampicillin  250 mg Oral Q6H  . deferasirox  1,500 mg Oral QAC breakfast  . escitalopram  5 mg Oral Daily  . folic acid  1 mg Oral Daily  . hydroxyurea  1,000 mg Oral QODAY  . hydroxyurea  500 mg Oral QODAY  . ibuprofen  800 mg Oral TID  . mirtazapine  7.5 mg Oral QHS  . oxyCODONE  10 mg Oral Q4H  . polyethylene glycol  17 g Oral Daily  . senna-docusate  1 tablet Oral BID   Previous Impatient Admission/Date/Reason:   Patient denies any hospitalizations   Emotional Health / Current Symptoms    Suicide/Self Harm  None reported   Suicide attempt in the past:   Patient denies any  previous attempts. Patient denies any current SI or HI.   Other harmful behavior:   None reported   Psychotic/Dissociative Symptoms  None reported   Other Psychotic/Dissociative Symptoms:    Attention/Behavioral Symptoms  Withdrawn   Other Attention / Behavioral Symptoms:   Patient withdrawn and soft spoken throughout assessment.    Cognitive Impairment  Orientation - Place  Orientation - Self  Orientation - Situation  Orientation - Time   Other Cognitive Impairment:    Mood and Adjustment  Flat    Stress, Anxiety, Trauma, Any Recent Loss/Stressor  None reported   Anxiety (frequency):   N/A   Phobia (specify):   N/A   Compulsive behavior (specify):   N/A   Obsessive behavior (specify):   N/A   Other:   N/A   Substance Abuse/Use  None   SBIRT completed (please refer for detailed history):  N  Self-reported substance use:   Patient denies any substance use.   Urinary Drug Screen Completed:  N Alcohol level:   N/A    Environmental/Housing/Living Arrangement  Stable housing   Who is in the home:   Son   Emergency contact:  Sharon-mom   Financial  Medicare  Medicaid   Patient's Strengths and Goals (patient's own words):   Patient reports that mom is supportive.   Clinical Social Worker's Interpretive Summary:   CSW received referral to complete psychosocial assessment. CSW reviewed chat and met with patient and mom at bedside. CSW introduced myself  and explained role. Patient agreeable to mom involvement during assessment.    Patient reports that she has been hospitalized several times due to sickle cell crisis. Patient reports that she has an 22 year old son who is being watched by her father. Patient lives at home with son and reports that parents are supportive.    Patient reports that she asked to see psychiatrist due to feeling depressed. Patient reports that she lost a child which triggered depression. Patient went to support groups  after baby's death but reports no follow up since groups. Patient was not taking any medication but is interested in taking medication to assist with depression.    Patient reports that psych MD prescribed medication. CSW explained that patient could follow up with an agency on outpatient basis in order to receive medication management and therapy once she DC from the hospital. Patient is agreeable to plan. CSW provided referral to [facility] in [place] who accepts patient's insurance.    Patient was alert and oriented during assessment. Patient was soft spoken but agreeable to assessment. Patient reports no SA and contracts for safety. Patient feels that additional formal support will assist with depression.    CSW made outpatient referral and will continue to follow throughout hospitalization.   Disposition:  Recommend Psych CSW continuing to support while in hospital

## 2013-05-12 NOTE — Progress Notes (Signed)
SICKLE CELL SERVICE PROGRESS NOTE  Dawn [name] ZOX:[id number] DOB: 16-Mar-1981 DOA: 05/05/2013 PCP: Nikesh Teschner A., MD  Assessment/Plan: Active Problems:   1. Hb SS with Crisis: Pt's pain is significantly improved. However she is still preferentially receiving IV Dilaudid. Will schedule her oral medications and give IV dilaudid for rescue. Pt has had a break-up with her boyfriend of 2 years which is contributing a psychological component to her pain. She acknowledges this and understands that oral analgesic medications will not mediate psychological pain. 2. UTI: Urine culture is positive for Enterococcus in urine which is sensitive to Ampicillin. Will treat empirically with Ampicillin 500 mg q 6 hours for 3 days. 3. 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 Will continue to monitor. 4. Gait Abnormality: Improved. Pt now able to ambulate around room without assistance. 5. Depression: Pt seen by Psychiatrist. Appreciate input. Will continue Lexapro and Remeron as recommended by Dr. Elsie Saas. Follow-up as out patient.   Code Status: Full COde Family Communication: N/A Disposition Plan: Home in 24 hours  Marilyn Nihiser A.  Pager [phone]. If 7PM-7AM, please contact night-coverage.  05/12/2013, 12:55 PM  LOS: 7 days   Brief narrative: Dawn [name] is well known to me as a patient with Hb SS who is compliant with her disease management and who typically manages her symptoms of pain wellin the out patient setting with oral analgesics. She has been having increased pain typical of her pain of vaso-occlusive episodes and has received acute management in the out patient setting at the [facility]. Despite this her pain has persisted to the point that it has impaired her ambulation. Her pain is throbbing in nature and is at an intensity of 10/10. It is worse with ambulation and movement. She also has tenderness even to the slightest touch along the BLE's. She  states that her oral medications decrease the pain to 9.5/10. She has had nausea but no emesis but has had poor oral intake and has been unable to effectively hydrate herself at home because of the nausea.   Consultants:  None  Procedures:  Transfusion 1 unit PRBC 8/13  Antibiotics:  Rocephin 8/13 >>8/14  HPI/Subjective: Pt is melancholy today due to the break-up from her boyfriend of 2 years. She just found out that he has been involved with another paramour.  Her pain is improved rated as 5/10 which is usually tolerable at home. Last BM yesterday.  Objective: Filed Vitals:   05/11/13 0549 05/11/13 1509 05/11/13 2058 05/12/13 0537  BP: 102/69 104/68 109/73 109/73  Pulse: 97 100 96 91  Temp: 98.7 F (37.1 C) 98.9 F (37.2 C) 98.3 F (36.8 C) 97.9 F (36.6 C)  TempSrc: Oral Oral Oral Oral  Resp: 18 18 16 18   Height:      Weight:      SpO2: 91% 91% 91%    Weight change:   Intake/Output Summary (Last 24 hours) at 05/12/13 1255 Last data filed at 05/12/13 0400  Gross per 24 hour  Intake 3452.5 ml  Output      0 ml  Net 3452.5 ml    General: Alert, awake, oriented x3, in no acute distress. Sitting on side of bread eating breakfast. HEENT: /AT PEERL, EOMI, mild icterus (at baseline). OROPHARYNX:  Moist, No exudate/ erythema/lesions.  Heart: Regular rate and rhythm, without murmurs, rubs, .  Lungs: Clear to auscultation, no wheezing or rhonchi noted.  Abdomen: Soft, nontender, nondistended, positive bowel sounds, no masses no hepatosplenomegaly  noted.  Neuro: No focal neurological deficits noted cranial nerves II through XII grossly intact. Strength functional in bilateral upper and lower extremities. Musculoskeletal: No warm swelling or erythema around joints, no spinal tenderness noted. Psychiatric: Patient alert and oriented x3, good insight and cognition, good recent to remote recall.However sad affect secondary to above    Scheduled Meds: . deferasirox  1,500  mg Oral QAC breakfast  . escitalopram  5 mg Oral Daily  . folic acid  1 mg Oral Daily  . hydroxyurea  1,000 mg Oral QODAY  . hydroxyurea  500 mg Oral QODAY  . ibuprofen  800 mg Oral TID  . mirtazapine  7.5 mg Oral QHS  . oxyCODONE  10 mg Oral Q4H  . polyethylene glycol  17 g Oral Daily  . senna-docusate  1 tablet Oral BID   Continuous Infusions: . dextrose 5 % and 0.45% NaCl 50 mL/hr at 05/11/13 1915    Time spent 45 minutes

## 2013-05-12 NOTE — Progress Notes (Signed)
Subjective: Patient is still having a 6/10 pain today. She was upset by some family issues and is in tears. Denies any suicidal or homicidal ideation. She has continued to have some hemoptysis on and off. No fever, no NVD. She has been off Dilaudid PCA and is now on IV Dilaudid 4mg  Q 4hours. No other complaints however. No fever, no NVD.  Objective: Vital signs in last 24 hours: Temp:  [97.9 F (36.6 C)-98.9 F (37.2 C)] 97.9 F (36.6 C) (08/18 0537) Pulse Rate:  [91-100] 91 (08/18 0537) Resp:  [16-18] 18 (08/19 0537) BP: (104-109)/(68-73) 109/73 mmHg (08/18 0537) SpO2:  [91 %] 91 % (08/18 2058) Weight change:  Last BM Date: 05/08/13  Intake/Output from previous day: 08/18 0701 - 08/19 0700 In: 3452.5 [I.V.:3452.5] Out: -  Intake/Output this shift:    General appearance: alert, cooperative and no distress Eyes: conjunctivae/corneas clear. PERRL, EOM's intact. Fundi benign. Neck: no adenopathy, no carotid bruit, no JVD, supple, symmetrical, trachea midline and thyroid not enlarged, symmetric, no tenderness/mass/nodules Back: symmetric, no curvature. ROM normal. No CVA tenderness. Resp: clear to auscultation bilaterally Chest wall: no tenderness Cardio: regular rate and rhythm, S1, S2 normal, no murmur, click, rub or gallop GI: soft, non-tender; bowel sounds normal; no masses,  no organomegaly Extremities: extremities normal, atraumatic, no cyanosis or edema Pulses: 2+ and symmetric Skin: Skin color, texture, turgor normal. No rashes or lesions Neurologic: Grossly normal Psychaitry: Depressed, not suicidal, in tears.   Lab Results:  Recent Labs  05/10/13 0414 05/11/13 0445  WBC 8.1 6.3  HGB 7.9* 7.2*  HCT 21.8* 20.2*  PLT 187 141*   BMET No results found for this basename: NA, K, CL, CO2, GLUCOSE, BUN, CREATININE, CALCIUM,  in the last 72 hours  Studies/Results: Dg Chest 2 View  05/10/2013   *RADIOLOGY REPORT*  Clinical Data: Fever and cough; sickle cell disease   CHEST - 2 VIEW  Comparison: May 06, 2013  Findings: Central catheter tip is in the superior vena cava.  No pneumothorax.  There is minimal bibasilar lung scarring.  Lungs are otherwise clear.  Heart size and pulmonary vascularity are normal. No adenopathy.  Bony structures appear intact.  IMPRESSION: Edema or consolidation.  Minimal bibasilar lung scarring.   Original Report Authenticated By: Bretta Bang, M.D.    Medications: I have reviewed the patient's current medications.  Assessment/Plan: A 32 yo woman admitted with sickle cell painful crisis and hemolytic crisis.   #1 Sickle Cell Painful Crisis: Still having pain at 6/10. Patient more depressed however and may be projecting that to her pain. She has refused to get out of bed or move around complaining about pain. No evidence of active or worsening crisis however.  #2 sickle cell hemolytic anemia: H&H has dropped to 7.2. Still close to her baseline. No evidence of significant hemolysis.  #4 Depression: This seems more severe. Will call psychiatry consult. She is more likely to respond better to treatment if her depressive symptoms are addressed.  #5 Disposition: Patient can be discharged once her Depression is addressed.  LOS: 6 days   GARBA,LAWAL 05/11/2013, 11:46 AM

## 2013-05-13 ENCOUNTER — Inpatient Hospital Stay (HOSPITAL_COMMUNITY): Payer: Medicare Other

## 2013-05-13 LAB — CULTURE, BLOOD (ROUTINE X 2): Culture: NO GROWTH

## 2013-05-13 MED ORDER — AMPICILLIN 250 MG PO CAPS: 250.0000 mg | ORAL_CAPSULE | Freq: Four times a day (QID) | ORAL | Status: DC

## 2013-05-13 MED ORDER — LEVOFLOXACIN 750 MG PO TABS
750.0000 mg | ORAL_TABLET | ORAL | Status: DC
  Administered 2013-05-13 – 2013-05-18 (×6): 750 mg via ORAL
  Filled 2013-05-13 (×8): qty 1

## 2013-05-13 NOTE — Progress Notes (Signed)
Clinical Social Work  CSW met with patient and family at bedside. Patient sitting and visiting with family. Patient reports she is doing well and feeling better. CSW agreeable to follow up after visitors have left. CSW encouraged patient to call CSW if needed. CSW will continue to follow.  [place], [place] [phone]

## 2013-05-13 NOTE — Progress Notes (Signed)
Subjective: Patient is having shortness of breath and has dropped her oxygen saturation in to the 80s with mobility. No fever but still tired. Taking her anti-depressants. She feels better mentally. Has been on Ampicillin since yesterday for Enterecoccus UTI but is asymptomatic. No fever,no dysuria. Objective: Vital signs in last 24 hours: Temp:  [97.9 F (36.6 C)-98.9 F (37.2 C)] 97.9 F (36.6 C) (08/20 0537) Pulse Rate:  [91-100] 91 (08/20 0537) Resp:  [16-18] 18 (08/20 0537) BP: (104-109)/(68-73) 109/73 mmHg (08/20 0537) SpO2:  [91 %] 91 % (08/20 2058) Weight change:  Last BM Date: 05/11/13  Intake/Output from previous day: 08/19 0701 - 08/20 0700 In: 2031.7 [P.O.:840; I.V.:1191.7] Out: -  Intake/Output this shift:    General appearance: alert, cooperative and no distress Eyes: conjunctivae/corneas clear. PERRL, EOM's intact. Fundi benign. Neck: no adenopathy, no carotid bruit, no JVD, supple, symmetrical, trachea midline and thyroid not enlarged, symmetric, no tenderness/mass/nodules Back: symmetric, no curvature. ROM normal. No CVA tenderness. Resp: clear to auscultation bilaterally Chest wall: no tenderness Cardio: regular rate and rhythm, S1, S2 normal, no murmur, click, rub or gallop GI: soft, non-tender; bowel sounds normal; no masses,  no organomegaly Extremities: extremities normal, atraumatic, no cyanosis or edema Pulses: 2+ and symmetric Skin: Skin color, texture, turgor normal. No rashes or lesions Neurologic: Grossly normal Psychaitry: Depressed, not suicidal. Improved mood.   Lab Results:  Recent Labs  05/11/13 0445  WBC 6.3  HGB 7.2*  HCT 20.2*  PLT 141*   BMET  Recent Labs  05/12/13 1025  NA 137  K 4.0  CL 105  CO2 28  GLUCOSE 97  BUN 6  CREATININE 0.53  CALCIUM 8.6    Studies/Results: Dg Chest 2 View  05/13/2013   *RADIOLOGY REPORT*  Clinical Data: Short of breath.  Sickle cell anemia.  CHEST - 2 VIEW  Comparison: 8/718/2014.   Findings: Heart size within normal limits for projection. Unchanged left subclavian power port. Linear and patchy densities present in the left lower lobe.  Frontal views suggest that this is due to subsegmental atelectasis however on the lateral view, this is less clear and airspace disease is difficult to exclude. Cholecystectomy clips are present in the right upper quadrant. There is no pleural effusion.  IMPRESSION: New left lower lobe density favored to represent atelectasis over airspace disease/pneumonia.   Original Report Authenticated By: Andreas Newport, M.D.    Medications: I have reviewed the patient's current medications.  Assessment/Plan: A 32 yo woman admitted with sickle cell painful crisis and hemolytic crisis.   #1 Hypoxia: Seems dyspnea on exertion. CXR suggests Atelectasis vs pneumonia. No fever or leukocytosis to suggest pneumonia. Will empirically treat for pneumonia. Get oxygen and incentive spirometry. Will switch Ampicillin to Levaquin to treat both UTI and possible pneumonia.  #2 Sickle Cell Painful Crisis:On oral medications. This seems to help.  #3 sickle cell hemolytic anemia: H&H has been stable.  #4 Depression: Continue treatment as per psychiatry.  #5 Disposition: Patient will be discharged once able to titrate off oxygen.  LOS: 6 days   GARBA,LAWAL 05/13/2013, 11:46 AM

## 2013-05-14 ENCOUNTER — Inpatient Hospital Stay (HOSPITAL_COMMUNITY): Payer: Medicare Other

## 2013-05-14 DIAGNOSIS — R0902 Hypoxemia: Secondary | ICD-10-CM

## 2013-05-14 LAB — CBC WITH DIFFERENTIAL/PLATELET
Band Neutrophils: 0 % (ref 0–10)
Basophils Absolute: 0.1 10*3/uL (ref 0.0–0.1)
Basophils Relative: 1 % (ref 0–1)
Eosinophils Absolute: 0.4 10*3/uL (ref 0.0–0.7)
Eosinophils Relative: 5 % (ref 0–5)
HCT: 20.5 % — ABNORMAL LOW (ref 36.0–46.0)
MCH: 34.6 pg — ABNORMAL HIGH (ref 26.0–34.0)
MCV: 95.8 fL (ref 78.0–100.0)
Metamyelocytes Relative: 0 %
Monocytes Absolute: 1.4 10*3/uL — ABNORMAL HIGH (ref 0.1–1.0)
Monocytes Relative: 17 % — ABNORMAL HIGH (ref 3–12)
Myelocytes: 0 %
Platelets: 195 10*3/uL (ref 150–400)
RBC: 2.14 MIL/uL — ABNORMAL LOW (ref 3.87–5.11)
nRBC: 13 /100 WBC — ABNORMAL HIGH

## 2013-05-14 MED ORDER — FUROSEMIDE 20 MG PO TABS
20.0000 mg | ORAL_TABLET | Freq: Once | ORAL | Status: AC
  Administered 2013-05-14: 20 mg via ORAL
  Filled 2013-05-14: qty 1

## 2013-05-14 MED ORDER — HYDROMORPHONE HCL PF 2 MG/ML IJ SOLN
2.0000 mg | INTRAMUSCULAR | Status: DC | PRN
  Administered 2013-05-14 – 2013-05-15 (×5): 2 mg via INTRAVENOUS
  Filled 2013-05-14 (×5): qty 1

## 2013-05-14 MED ORDER — IOHEXOL 350 MG/ML SOLN
100.0000 mL | Freq: Once | INTRAVENOUS | Status: AC | PRN
  Administered 2013-05-14: 100 mL via INTRAVENOUS

## 2013-05-14 NOTE — Care Management Note (Addendum)
    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   Anticipated DC Date:  05/17/2013   Anticipated DC Plan:  HOME/SELF CARE      DC Planning Services  CM consult      Choice offered to / List presented to:             Status of service:  In process, will continue to follow Medicare Important Message given?  NA - LOS <3 / Initial given by admissions (If response is "NO", the following Medicare IM given date fields will be blank) Date Medicare IM given:   Date Additional Medicare IM given:    Discharge Disposition:    Per UR Regulation:  Reviewed for med. necessity/level of care/duration of stay  If discussed at Long Length of Stay Meetings, dates discussed:   05/12/2013  05/14/2013    Comments:  05/15/13 Jetta Murray RN,BSN NCM [phone] NOTED 02 SATS @[facility]  ON RA 85%,&88%.QUALIFIES FOR HOME 02 IF NEEDED.IV DILAUDID Q3H.  05/14/13 Sarie Stall RN,BSN NCM [phone] LOW 02 SATS, IF HOME 02 NEEDED CAN ARRANGE IF QUALIFIES.PAIN CONTROL ISSUES-IV DILAUDID.  05/13/13 Osceola Holian RN,BSN NCM [phone] PAIN CONTROL ISSUES-DILAUDID IV Q2.PSYCH-DEPRESSION,[facility]-OUTPATIENT.D/C PLAN HOME.

## 2013-05-14 NOTE — Progress Notes (Signed)
SICKLE CELL SERVICE PROGRESS NOTE  Dawn [name] WGN:[id number] DOB: 1981-06-21 DOA: 05/05/2013 PCP: Shivank Pinedo A., MD  Assessment/Plan: Active Problems:   1. Hypoxemia: Pt has had hypoxemia in the last 48 hours. This is not her baseline of functioning. She has no clinical signs of an acute infectious pneumonic process and her examination is not consistent with airway constriction. She does have some mild crackles in the left base. Will obtain a CT angiogram to evaluate for PE, and will give a small dose of Lasix and stop IVF.  2. Hb SS with Crisis: Essentially resolved. Continue home regimen and give Dilaudid q 4 hours for rescue doses. 3. UTI: Urine culture is positive for Enterococcus in urine which is sensitive to Ampicillin. Will treat empirically with Ampicillin 500 mg q 6 hours for 3 days. 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 Will continue to monitor. 5. Gait Abnormality: Improved. Pt independent in ambulation. 6. Depression: Pt seen by Psychiatrist. Appreciate input. Will continue Lexapro and Remeron as recommended by Dr. Elsie Saas. Follow-up as out patient.   Code Status: Full COde Family Communication: N/A Disposition Plan: Home in 24 hours  Chelbi Herber A.  Pager [phone]. If 7PM-7AM, please contact night-coverage.  05/14/2013, 11:54 AM  LOS: 9 days   Brief narrative: Leanora is well known to me as a patient with Hb SS who is compliant with her disease management and who typically manages her symptoms of pain wellin the out patient setting with oral analgesics. She has been having increased pain typical of her pain of vaso-occlusive episodes and has received acute management in the out patient setting at the [facility]. Despite this her pain has persisted to the point that it has impaired her ambulation. Her pain is throbbing in nature and is at an intensity of 10/10. It is worse with ambulation and movement. She also has  tenderness even to the slightest touch along the BLE's. She states that her oral medications decrease the pain to 9.5/10. She has had nausea but no emesis but has had poor oral intake and has been unable to effectively hydrate herself at home because of the nausea.   Consultants:  None  Procedures:  Transfusion 1 unit PRBC 8/13  Antibiotics:  Rocephin 8/13 >>8/14  Ampicillin 8/19 >>  HPI/Subjective: Pt is in much better spirits today. Denies any cough, or bleeding. States that she had a nosebleed 4 days ago which was self-limited. No further bleeding. Last BM yesterday.  Objective: Filed Vitals:   05/13/13 2200 05/14/13 0535 05/14/13 0749 05/14/13 1040  BP: 108/60 109/67 110/50 141/59  Pulse: 93 103 101 104  Temp: 97.9 F (36.6 C) 98.1 F (36.7 C) 97.9 F (36.6 C) 98 F (36.7 C)  TempSrc: Oral Oral Oral Oral  Resp: 18 18 20 20   Height:      Weight:  154 lb 8.7 oz (70.1 kg)    SpO2: 100% 92% 95% 91%   Weight change:   Intake/Output Summary (Last 24 hours) at 05/14/13 1154 Last data filed at 05/14/13 0610  Gross per 24 hour  Intake   1200 ml  Output      0 ml  Net   1200 ml    General: Alert, awake, oriented x3, in no acute distress.  HEENT: [place]/AT PEERL, EOMI, mild icterus (at baseline). OROPHARYNX:  Moist, No exudate/ erythema/lesions.  Heart: Regular rate and rhythm, without murmurs, rubs, .  Lungs: Mild crackles at left base of  lungs. No wheezing or rhonchi noted.  Abdomen: Soft, nontender, nondistended, positive bowel sounds, no masses no hepatosplenomegaly noted.  Neuro: No focal neurological deficits noted cranial nerves II through XII grossly intact. Strength functional in bilateral upper and lower extremities. Musculoskeletal: No warm swelling or erythema around joints, no spinal tenderness noted.     Scheduled Meds: . deferasirox  1,500 mg Oral QAC breakfast  . escitalopram  5 mg Oral Daily  . folic acid  1 mg Oral Daily  . furosemide  20 mg Oral  Once  . hydroxyurea  1,000 mg Oral QODAY  . hydroxyurea  500 mg Oral QODAY  . ibuprofen  800 mg Oral TID  . levofloxacin  750 mg Oral Q24H  . mirtazapine  7.5 mg Oral QHS  . oxyCODONE  10 mg Oral Q4H  . polyethylene glycol  17 g Oral Daily  . senna-docusate  1 tablet Oral BID   Continuous Infusions: . dextrose 5 % and 0.45% NaCl 50 mL/hr at 05/14/13 0139    Time spent 35 minutes

## 2013-05-14 NOTE — Progress Notes (Signed)
D: Patient on O2 at 2 L/min, to walk in hall off O2. A: Patient ambulated 60-ft in hall, O2 sat maintained between 88-89%. Incentive spirometrey, pulled 1500 cc. States she feels more comfortable on O2, left on 2L. R: Patient sitting on side of bed no distress. Pain level slightly increased to "7" at lower back (ache) and legs (throbbing), feels slight nauseted. Phenergan given, feels stable.

## 2013-05-15 MED ORDER — IPRATROPIUM BROMIDE 0.02 % IN SOLN
0.5000 mg | RESPIRATORY_TRACT | Status: DC | PRN
  Filled 2013-05-15: qty 2.5

## 2013-05-15 MED ORDER — ALBUTEROL SULFATE (5 MG/ML) 0.5% IN NEBU
2.5000 mg | INHALATION_SOLUTION | RESPIRATORY_TRACT | Status: DC | PRN
  Filled 2013-05-15: qty 0.5

## 2013-05-15 MED ORDER — HYDROMORPHONE HCL PF 2 MG/ML IJ SOLN
2.0000 mg | INTRAMUSCULAR | Status: DC | PRN
  Administered 2013-05-15 – 2013-05-19 (×24): 2 mg via INTRAVENOUS
  Filled 2013-05-15 (×24): qty 1

## 2013-05-15 NOTE — Progress Notes (Addendum)
Pt's O2 sats on room air at rest were between 85 & 88%. Placed pt back on 2L O2.

## 2013-05-15 NOTE — Progress Notes (Signed)
Clinical Social Work Progress Note PSYCHIATRY SERVICE LINE 05/15/2013  Patient:  Dawn [name]  Account:  [id number]  Admit Date:  05/05/2013  Clinical Social Worker:  Unk Lightning, LCSW  Date/Time:  05/15/2013 09:55 AM  Review of Patient  Overall Medical Condition:   Patient reports she is anxious to feel better so she can DC.   Participation Level:  Minimal  Participation Quality  Guarded   Other Participation Quality:   Patient disengaged throughout assessment.   Affect  Flat   Cognitive  Alert   Reaction to Medications/Concerns:   None reported   Modes of Intervention  Support  Solution-Focused   Summary of Progress/Plan at Discharge   CSW met with patient at bedside when no visitors were present. Patient laying in bed with TV on and looking through her phone. Patient disengaged with limited eye contact. Patient reports frustration with being in the hospital and wanting to get better so she can DC. CSW validated these feelings and educated patient that she could speak with CSW regarding frustrations. Patient reports she has been relying on support from friends and family.    CSW discussed outpatient follow up at [facility] with patient. Patient is agreeable to therapy and medication management and feels it will be helpful at DC. CSW provided referral and placed information on AVS.

## 2013-05-15 NOTE — Progress Notes (Signed)
Subjective: Patient seen, c/o back pain persisting at 5/10, not significantly improved from yesterday. States she is using narcotics as scheduled every 4 hours. This is her typical sickle cell pain. Her function is better, her leg pain and sensitivity is improved. She states she does not have daily pain at home. She is depressed and was recently statred on antidepressants. She is being seen by inpatient psych.   Objective: Weight change:  No intake or output data in the 24 hours ending 05/15/13 0935 BP 106/66  Pulse 89  Temp(Src) 98.3 F (36.8 C) (Oral)  Resp 18  Ht 5\' 3"  (1.6 m)  Wt 70.1 kg (154 lb 8.7 oz)  BMI 27.38 kg/m2  SpO2 97%  General Appearance:    Alert, cooperative, no distress, appears stated age  Head:    Normocephalic, without obvious abnormality, atraumatic  Eyes:    PERRL, scleral icterus/muddy sclera     Nose:   Nares normal, septum midline, mucosa normal, no drainage    or sinus tenderness  Throat:   Lips, mucosa, and tongue normal; teeth and gums normal  Neck:   Supple, symmetrical, trachea midline, no adenopathy;    thyroid:  no enlargement/tenderness/nodules; no carotid   bruit or JVD  Back:     Symmetric, no curvature, ROM normal, no CVA tenderness  Lungs:     Clear to auscultation bilaterally, respirations unlabored  Chest Wall:    No tenderness or deformity   Heart:    Regular rate and rhythm, S1 and S2 normal, no murmur, rub   or gallop     Abdomen:     Soft, non-tender, bowel sounds active all four quadrants,    no masses, no organomegaly        Extremities:   Extremities normal, atraumatic, no cyanosis or edema  Pulses:   2+ and symmetric all extremities  Skin:   Skin color, texture, turgor normal, no rashes or lesions  Lymph nodes:   Cervical, supraclavicular, and axillary nodes normal  Neurologic:   CNII-XII intact, normal strength, sensation and reflexes    throughout    Lab Results:  Recent Labs  05/12/13 1025  NA 137  K 4.0  CL 105   CO2 28  GLUCOSE 97  BUN 6  CREATININE 0.53  CALCIUM 8.6    Recent Labs  05/12/13 1025  AST 92*  ALT 33  ALKPHOS 164*  BILITOT 6.9*  PROT 7.0  ALBUMIN 2.7*   No results found for this basename: LIPASE, AMYLASE,  in the last 72 hours  Recent Labs  05/14/13 0905  WBC 8.4  NEUTROABS 3.9  HGB 7.4*  HCT 20.5*  MCV 95.8  PLT 195     Micro Results: Recent Results (from the past 240 hour(s))  URINE CULTURE     Status: None   Collection Time    05/06/13  8:24 PM      Result Value Range Status   Specimen Description URINE, RANDOM   Final   Special Requests Normal   Final   Culture  Setup Time     Final   Value: 05/07/2013 03:28     Performed at [facility] Count     Final   Value: >=100,000 COLONIES/ML     Performed at [facility]   Culture     Final   Value: ENTEROCOCCUS SPECIES     Performed at [facility]   Report Status 05/09/2013 FINAL   Final  Organism ID, Bacteria ENTEROCOCCUS SPECIES   Final  CULTURE, BLOOD (ROUTINE X 2)     Status: None   Collection Time    05/06/13  8:40 PM      Result Value Range Status   Specimen Description BLOOD RIGHT ARM   Final   Special Requests BOTTLES DRAWN AEROBIC AND ANAEROBIC 5CC   Final   Culture  Setup Time     Final   Value: 05/07/2013 03:21     Performed at [facility]   Culture     Final   Value: NO GROWTH 5 DAYS     Performed at [facility]   Report Status 05/13/2013 FINAL   Final    Studies/Results: Dg Chest 2 View  05/13/2013   *RADIOLOGY REPORT*  Clinical Data: Short of breath.  Sickle cell anemia.  CHEST - 2 VIEW  Comparison: 8/718/2014.  Findings: Heart size within normal limits for projection. Unchanged left subclavian power port. Linear and patchy densities present in the left lower lobe.  Frontal views suggest that this is due to subsegmental atelectasis however on the lateral view, this is less clear and airspace disease is difficult to exclude.  Cholecystectomy clips are present in the right upper quadrant. There is no pleural effusion.  IMPRESSION: New left lower lobe density favored to represent atelectasis over airspace disease/pneumonia.   Original Report Authenticated By: Andreas Newport, M.D.   Dg Chest 2 View  05/10/2013   *RADIOLOGY REPORT*  Clinical Data: Fever and cough; sickle cell disease  CHEST - 2 VIEW  Comparison: May 06, 2013  Findings: Central catheter tip is in the superior vena cava.  No pneumothorax.  There is minimal bibasilar lung scarring.  Lungs are otherwise clear.  Heart size and pulmonary vascularity are normal. No adenopathy.  Bony structures appear intact.  IMPRESSION: Edema or consolidation.  Minimal bibasilar lung scarring.   Original Report Authenticated By: Bretta Bang, M.D.   Ct Angio Chest Pe W/cm &/or Wo Cm  05/14/2013   *RADIOLOGY REPORT*  Clinical Data: Shortness of breath; sickle cell disease; decreased oxygen saturation  CT ANGIOGRAPHY CHEST  Technique:  Multidetector CT imaging of the chest using the standard protocol during bolus administration of intravenous contrast. Multiplanar reconstructed images including MIPs were obtained and reviewed to evaluate the vascular anatomy.  Contrast: OMNIPAQUE IOHEXOL 350 MG/ML SOLN  Comparison: Chest radiograph May 13, 2013  Findings: There is no demonstrable pulmonary embolus.  There is no thoracic aortic aneurysm or dissection.  There is patchy airspace consolidation in both lower lobes, slightly more on the left than on the right.  There is also mild patchy infiltrate in the inferior lingula.  There is no appreciable thoracic adenopathy.  Pericardium is not thickened.  In the upper abdomen, the spleen is atrophic and calcified. Visualized upper abdominal structures otherwise appear unremarkable.  There are sclerotic areas in multiple bony structures consistent with chronic anemia.  There are no areas of bony destruction. There is avascular necrosis in  both humeral heads, however.  Thyroid appears normal.  IMPRESSION: No demonstrable pulmonary embolus.  Areas of patchy infiltrate in both lower lobes and inferior lingula.  Sclerotic areas in bony structures consistent with chronic anemia.  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  Spleen is atrophic and calcified.  Comment:  Central catheter tip is in the superior vena cava.  No pneumothorax.   Original Report Authenticated By: Bretta Bang, M.D.   Dg Chest [facility]  05/06/2013   *RADIOLOGY REPORT*  Clinical Data: Fever and cough.  Sickle cell.  PORTABLE CHEST - 1 VIEW  Comparison: 02/23/2013  Findings: Left subclavian PowerPort remains in place, with a kink in the  tubing near the   costoclavicular ligament suggesting increased risk of pinch-off syndrome.  Lungs clear.  Stable mild cardiomegaly.  No definite effusion.  Sclerotic changes in bilateral humeral heads suggesting AVN.  IMPRESSION:  1.  Stable mild cardiomegaly.   Original Report Authenticated By: D. Andria Rhein, MD   Medications: Scheduled Meds: . deferasirox  1,500 mg Oral QAC breakfast  . escitalopram  5 mg Oral Daily  . folic acid  1 mg Oral Daily  . hydroxyurea  1,000 mg Oral QODAY  . hydroxyurea  500 mg Oral QODAY  . ibuprofen  800 mg Oral TID  . levofloxacin  750 mg Oral Q24H  . mirtazapine  7.5 mg Oral QHS  . oxyCODONE  10 mg Oral Q4H  . polyethylene glycol  17 g Oral Daily  . senna-docusate  1 tablet Oral BID   Continuous Infusions: . dextrose 5 % and 0.45% NaCl 50 mL/hr at 05/14/13 0139   PRN Meds:.acetaminophen, diazepam, diphenhydrAMINE, HYDROmorphone (DILAUDID) injection, naloxone, ondansetron (ZOFRAN) IV, ondansetron, promethazine, promethazine, sodium chloride, sodium chloride, sodium chloride  Assessment/Plan: @PROBHOSP @  LOS: 10 days   Active Problems:  1. Hypoxemia/ pneumonia: CT chest negative for PE, however, does show some infiltrates. Levaquin has been started. Patient   is otherwise  asymptomatic but would add as needed and nebulizers.  2. Hb SS with Crisis: still complains of pain, continue home regimen and increase frequency of Dilaudid to q3 hours for rescue doses, patient is fairly opioid naive, so monitor with caution. 3. Urinary tract infection: Urine culture is positive for Enterococcus in urine which is sensitive to Ampicillin. Antibiotics changed to Levaquin yesterday to cover lung pathology. Enterococcus also sensitive to quinolones. 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 Will continue to monitor. 5. Gait Abnormality: Improved. Pt independent in ambulation. 6. Depression: Pt seen by Psychiatrist. Appreciate input. Will continue Lexapro and Remeron as recommended by Dr. Elsie Saas. Follow-up as out patient.   Code Status: Full COde  Family Communication: N/A  Disposition Plan: Home in 24 hours   Dawn [name] 05/15/2013, 9:35 AM

## 2013-05-16 MED ORDER — ACETAMINOPHEN 325 MG PO TABS
650.0000 mg | ORAL_TABLET | Freq: Once | ORAL | Status: DC
  Filled 2013-05-16: qty 2

## 2013-05-16 MED ORDER — DEXTROSE 5 % IV SOLN
2000.0000 mg | Freq: Every day | INTRAVENOUS | Status: DC
  Administered 2013-05-16 – 2013-05-19 (×3): 2000 mg via INTRAVENOUS
  Filled 2013-05-16 (×4): qty 2

## 2013-05-16 NOTE — Progress Notes (Signed)
Subjective: Patient seen, continues to complain of discomfort and pain in back, she was not aware that her Dilaudid is been increased to every 4 hours. Patient also appears to be depressed.  Objective: Weight change:  No intake or output data in the 24 hours ending 05/16/13 1044  BP 105/69  Pulse 88  Temp(Src) 98.3 F (36.8 C) (Oral)  Resp 18  Ht 5\' 3"  (1.6 m)  Wt 70.1 kg (154 lb 8.7 oz)  BMI 27.38 kg/m2  SpO2 94%  General Appearance:    Alert, cooperative, no distress, appears stated age  Head:    Normocephalic, without obvious abnormality, atraumatic  Eyes:    PERRL, scleral icterus      Nose:   Nares normal, septum midline, mucosa normal, no drainage    or sinus tenderness  Throat:   Lips, mucosa, and tongue normal; teeth and gums normal  Neck:   Supple, symmetrical, trachea midline, no adenopathy;    thyroid:  no enlargement/tenderness/nodules; no carotid   bruit or JVD  Back:     Symmetric, no curvature, ROM normal, no CVA tenderness  Lungs:     Clear to auscultation bilaterally, respirations unlabored  Chest Wall:    No tenderness or deformity   Heart:    Regular rate and rhythm, S1 and S2 normal, no murmur, rub   or gallop     Abdomen:     Soft, non-tender, bowel sounds active all four quadrants,    no masses, no organomegaly        Extremities:   Extremities normal, atraumatic, no cyaAnd nosis or edema  Pulses:   2+ and symmetric all extremities  Skin:   Skin color, texture, turgor normal, no rashes or lesions  Lymph nodes:   Cervical, supraclavicular, and axillary nodes normal  Neurologic:   CNII-XII intact, normal strength, sensation and reflexes    throughout    Lab Results: No results found for this basename: NA, K, CL, CO2, GLUCOSE, BUN, CREATININE, CALCIUM, MG, PHOS,  in the last 72 hours No results found for this basename: AST, ALT, ALKPHOS, BILITOT, PROT, ALBUMIN,  in the last 72 hours No results found for this basename: LIPASE, AMYLASE,  in the last 72  hours  Recent Labs  05/14/13 0905  WBC 8.4  NEUTROABS 3.9  HGB 7.4*  HCT 20.5*  MCV 95.8  PLT 195   No results found for this basename: CKTOTAL, CKMB, CKMBINDEX, TROPONINI,  in the last 72 hours No components found with this basename: POCBNP,  No results found for this basename: DDIMER,  in the last 72 hours No results found for this basename: HGBA1C,  in the last 72 hours No results found for this basename: CHOL, HDL, LDLCALC, TRIG, CHOLHDL, LDLDIRECT,  in the last 72 hours No results found for this basename: TSH, T4TOTAL, FREET3, T3FREE, THYROIDAB,  in the last 72 hours No results found for this basename: VITAMINB12, FOLATE, FERRITIN, TIBC, IRON, RETICCTPCT,  in the last 72 hours  Micro Results: Recent Results (from the past 240 hour(s))  URINE CULTURE     Status: None   Collection Time    05/06/13  8:24 PM      Result Value Range Status   Specimen Description URINE, RANDOM   Final   Special Requests Normal   Final   Culture  Setup Time     Final   Value: 05/07/2013 03:28     Performed at [facility] Count  Final   Value: >=100,000 COLONIES/ML     Performed at [facility]   Culture     Final   Value: ENTEROCOCCUS SPECIES     Performed at [facility]   Report Status 05/09/2013 FINAL   Final   Organism ID, Bacteria ENTEROCOCCUS SPECIES   Final  CULTURE, BLOOD (ROUTINE X 2)     Status: None   Collection Time    05/06/13  8:40 PM      Result Value Range Status   Specimen Description BLOOD RIGHT ARM   Final   Special Requests BOTTLES DRAWN AEROBIC AND ANAEROBIC 5CC   Final   Culture  Setup Time     Final   Value: 05/07/2013 03:21     Performed at [facility]   Culture     Final   Value: NO GROWTH 5 DAYS     Performed at [facility]   Report Status 05/13/2013 FINAL   Final    Studies/Results: Dg Chest 2 View  05/13/2013   *RADIOLOGY REPORT*  Clinical Data: Short of breath.  Sickle cell anemia.  CHEST - 2 VIEW   Comparison: 8/718/2014.  Findings: Heart size within normal limits for projection. Unchanged left subclavian power port. Linear and patchy densities present in the left lower lobe.  Frontal views suggest that this is due to subsegmental atelectasis however on the lateral view, this is less clear and airspace disease is difficult to exclude. Cholecystectomy clips are present in the right upper quadrant. There is no pleural effusion.  IMPRESSION: New left lower lobe density favored to represent atelectasis over airspace disease/pneumonia.   Original Report Authenticated By: Andreas Newport, M.D.   Dg Chest 2 View  05/10/2013   *RADIOLOGY REPORT*  Clinical Data: Fever and cough; sickle cell disease  CHEST - 2 VIEW  Comparison: May 06, 2013  Findings: Central catheter tip is in the superior vena cava.  No pneumothorax.  There is minimal bibasilar lung scarring.  Lungs are otherwise clear.  Heart size and pulmonary vascularity are normal. No adenopathy.  Bony structures appear intact.  IMPRESSION: Edema or consolidation.  Minimal bibasilar lung scarring.   Original Report Authenticated By: Bretta Bang, M.D.   Ct Angio Chest Pe W/cm &/or Wo Cm  05/14/2013   *RADIOLOGY REPORT*  Clinical Data: Shortness of breath; sickle cell disease; decreased oxygen saturation  CT ANGIOGRAPHY CHEST  Technique:  Multidetector CT imaging of the chest using the standard protocol during bolus administration of intravenous contrast. Multiplanar reconstructed images including MIPs were obtained and reviewed to evaluate the vascular anatomy.  Contrast: OMNIPAQUE IOHEXOL 350 MG/ML SOLN  Comparison: Chest radiograph May 13, 2013  Findings: There is no demonstrable pulmonary embolus.  There is no thoracic aortic aneurysm or dissection.  There is patchy airspace consolidation in both lower lobes, slightly more on the left than on the right.  There is also mild patchy infiltrate in the inferior lingula.  There is no appreciable  thoracic adenopathy.  Pericardium is not thickened.  In the upper abdomen, the spleen is atrophic and calcified. Visualized upper abdominal structures otherwise appear unremarkable.  There are sclerotic areas in multiple bony structures consistent with chronic anemia.  There are no areas of bony destruction. 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  Thyroid appears normal.  IMPRESSION: No demonstrable pulmonary embolus.  Areas of patchy infiltrate in both lower lobes and inferior lingula.  Sclerotic areas in bony structures consistent with chronic  anemia.  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  Spleen is atrophic and calcified.  Comment:  Central catheter tip is in the superior vena cava.  No pneumothorax.   Original Report Authenticated By: Bretta Bang, M.D.   Dg Chest Port 1 View  05/06/2013   *RADIOLOGY REPORT*  Clinical Data: Fever and cough.  Sickle cell.  PORTABLE CHEST - 1 VIEW  Comparison: 02/23/2013  Findings: Left subclavian PowerPort remains in place, with a kink in the  tubing near the   costoclavicular ligament suggesting increased risk of pinch-off syndrome.  Lungs clear.  Stable mild cardiomegaly.  No definite effusion.  Sclerotic changes in bilateral humeral heads suggesting AVN.  IMPRESSION:  1.  Stable mild cardiomegaly.   Original Report Authenticated By: D. Andria Rhein, MD   Medications: Scheduled Meds: . acetaminophen  650 mg Oral Once  . deferoxamine (DESFERAL) IV  2,000 mg Intravenous QHS  . escitalopram  5 mg Oral Daily  . folic acid  1 mg Oral Daily  . hydroxyurea  1,000 mg Oral QODAY  . hydroxyurea  500 mg Oral QODAY  . ibuprofen  800 mg Oral TID  . levofloxacin  750 mg Oral Q24H  . mirtazapine  7.5 mg Oral QHS  . oxyCODONE  10 mg Oral Q4H  . polyethylene glycol  17 g Oral Daily  . senna-docusate  1 tablet Oral BID   Continuous Infusions: . dextrose 5 % and 0.45% NaCl 50 mL/hr at 05/14/13 0139   PRN Meds:.acetaminophen, albuterol,  diazepam, diphenhydrAMINE, HYDROmorphone (DILAUDID) injection, ipratropium, naloxone, ondansetron (ZOFRAN) IV, ondansetron, promethazine, promethazine, sodium chloride, sodium chloride, sodium chloride  Assessment/Plan: @PROBHOSP @  LOS: 11 days   Active Problems:  1. Hypoxemia/ pneumonia: CT chest negative for PE, however, does show some infiltrates. Levaquin has been started. Patient remains hypoxic and, DuoNeb's orders. We'll reattempt weaning oxygen today. 2. Hb SS with Crisis: still complains of pain, continue home regimen, Dilaudid frequency increase to q3 hours for rescue doses the patient was not aware. Patient is fairly opioid naive, so monitor with caution. 3. Urinary tract infection: Urine culture is positive for Enterococcus in urine which is sensitive to Ampicillin. Antibiotics changed to Levaquin yesterday to cover lung pathology. Enterococcus also sensitive to quinolones. 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 Will continue to monitor. 5. Gait Abnormality: Improved. Pt independent in ambulation. 6. Depression: Pt seen by Psychiatrist. Appreciate input. Will continue Lexapro and Remeron as recommended by Dr. Elsie Saas. Follow-up as out patient.   Code Status: Full COde  Family Communication: N/A  Disposition Plan: Home in 24 hours   Dawn [name] 05/16/2013, 10:44 AM

## 2013-05-17 LAB — BASIC METABOLIC PANEL
Calcium: 8.8 mg/dL (ref 8.4–10.5)
GFR calc Af Amer: >90 mL/min (ref >90)
GFR calc non Af Amer: >90 mL/min (ref >90)
Potassium: 3.6 mEq/L (ref 3.5–5.1)
Sodium: 138 mEq/L (ref 135–145)

## 2013-05-17 LAB — LACTATE DEHYDROGENASE: LDH: 690 U/L — ABNORMAL HIGH (ref 94–250)

## 2013-05-17 NOTE — Progress Notes (Signed)
Subjective: Patient seen, reports pain in back and legs are unchanged from yesterday ranked at 5/10. States which receives the medication it goes down to 3/10 but rebounds within 2-1/2 hours to roughly. Pain in legs are less intense and nondistended to touch for the past 2 days. She reports no rest her symptoms.  Objective: Weight change:   Intake/Output Summary (Last 24 hours) at 05/17/13 0921 Last data filed at 05/16/13 1700  Gross per 24 hour  Intake    490 ml  Output      0 ml  Net    490 ml   BP 108/65  Pulse 84  Temp(Src) 98.6 F (37 C) (Oral)  Resp 16  Ht 5\' 3"  (1.6 m)  Wt 70.1 kg (154 lb 8.7 oz)  BMI 27.38 kg/m2  SpO2 98%  General Appearance:    Alert, cooperative, no distress, appears stated age  Head:    Normocephalic, without obvious abnormality, atraumatic  Eyes:    PERRL, conjunctiva/corneas clear,      Nose:   Nares normal, septum midline, mucosa normal, no drainage    or sinus tenderness  Throat:   Lips, mucosa, and tongue normal; teeth and gums normal  Neck:   Supple, symmetrical, trachea midline, no adenopathy;    thyroid:  no enlargement/tenderness/nodules; no carotid   bruit or JVD  Back:     Symmetric, no curvature, ROM normal, no CVA tenderness  Lungs:     Clear to auscultation bilaterally, respirations unlabored  Chest Wall:    No tenderness or deformity, Mediport left chest wall    Heart:    Regular rate and rhythm, S1 and S2 normal, no murmur, rub   or gallop     Abdomen:     Soft, non-tender, bowel sounds active all four quadrants,    no masses, no organomegaly        Extremities:   Extremities normal, atraumatic, no cyanosis or edema  Pulses:   2+ and symmetric all extremities  Skin:   Skin color, texture, turgor normal, no rashes or lesions  Lymph nodes:   Cervical, supraclavicular, and axillary nodes normal  Neurologic:   CNII-XII intact, normal strength, sensation and reflexes    throughout    Lab Results: No results found for this  basename: NA, K, CL, CO2, GLUCOSE, BUN, CREATININE, CALCIUM, MG, PHOS,  in the last 72 hours No results found for this basename: AST, ALT, ALKPHOS, BILITOT, PROT, ALBUMIN,  in the last 72 hours No results found for this basename: LIPASE, AMYLASE,  in the last 72 hours No results found for this basename: WBC, NEUTROABS, HGB, HCT, MCV, PLT,  in the last 72 hours No results found for this basename: CKTOTAL, CKMB, CKMBINDEX, TROPONINI,  in the last 72 hours No components found with this basename: POCBNP,  No results found for this basename: DDIMER,  in the last 72 hours No results found for this basename: HGBA1C,  in the last 72 hours No results found for this basename: CHOL, HDL, LDLCALC, TRIG, CHOLHDL, LDLDIRECT,  in the last 72 hours No results found for this basename: TSH, T4TOTAL, FREET3, T3FREE, THYROIDAB,  in the last 72 hours No results found for this basename: VITAMINB12, FOLATE, FERRITIN, TIBC, IRON, RETICCTPCT,  in the last 72 hours  Micro Results: No results found for this or any previous visit (from the past 240 hour(s)).  Studies/Results: Dg Chest 2 View  05/13/2013   *RADIOLOGY REPORT*  Clinical Data: Short of breath.  Sickle cell  anemia.  CHEST - 2 VIEW  Comparison: 8/718/2014.  Findings: Heart size within normal limits for projection. Unchanged left subclavian power port. Linear and patchy densities present in the left lower lobe.  Frontal views suggest that this is due to subsegmental atelectasis however on the lateral view, this is less clear and airspace disease is difficult to exclude. Cholecystectomy clips are present in the right upper quadrant. There is no pleural effusion.  IMPRESSION: New left lower lobe density favored to represent atelectasis over airspace disease/pneumonia.   Original Report Authenticated By: Andreas Newport, M.D.   Dg Chest 2 View  05/10/2013   *RADIOLOGY REPORT*  Clinical Data: Fever and cough; sickle cell disease  CHEST - 2 VIEW  Comparison: May 06, 2013  Findings: Central catheter tip is in the superior vena cava.  No pneumothorax.  There is minimal bibasilar lung scarring.  Lungs are otherwise clear.  Heart size and pulmonary vascularity are normal. No adenopathy.  Bony structures appear intact.  IMPRESSION: Edema or consolidation.  Minimal bibasilar lung scarring.   Original Report Authenticated By: Bretta Bang, M.D.   Ct Angio Chest Pe W/cm &/or Wo Cm  05/14/2013   *RADIOLOGY REPORT*  Clinical Data: Shortness of breath; sickle cell disease; decreased oxygen saturation  CT ANGIOGRAPHY CHEST  Technique:  Multidetector CT imaging of the chest using the standard protocol during bolus administration of intravenous contrast. Multiplanar reconstructed images including MIPs were obtained and reviewed to evaluate the vascular anatomy.  Contrast: OMNIPAQUE IOHEXOL 350 MG/ML SOLN  Comparison: Chest radiograph May 13, 2013  Findings: There is no demonstrable pulmonary embolus.  There is no thoracic aortic aneurysm or dissection.  There is patchy airspace consolidation in both lower lobes, slightly more on the left than on the right.  There is also mild patchy infiltrate in the inferior lingula.  There is no appreciable thoracic adenopathy.  Pericardium is not thickened.  In the upper abdomen, the spleen is atrophic and calcified. Visualized upper abdominal structures otherwise appear unremarkable.  There are sclerotic areas in multiple bony structures consistent with chronic anemia.  There are no areas of bony destruction. 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  Thyroid appears normal.  IMPRESSION: No demonstrable pulmonary embolus.  Areas of patchy infiltrate in both lower lobes and inferior lingula.  Sclerotic areas in bony structures consistent with chronic anemia.  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  Spleen is atrophic and calcified.  Comment:  Central catheter tip is in the superior vena cava.  No pneumothorax.    Original Report Authenticated By: Bretta Bang, M.D.   Dg Chest Port 1 View  05/06/2013   *RADIOLOGY REPORT*  Clinical Data: Fever and cough.  Sickle cell.  PORTABLE CHEST - 1 VIEW  Comparison: 02/23/2013  Findings: Left subclavian PowerPort remains in place, with a kink in the  tubing near the   costoclavicular ligament suggesting increased risk of pinch-off syndrome.  Lungs clear.  Stable mild cardiomegaly.  No definite effusion.  Sclerotic changes in bilateral humeral heads suggesting AVN.  IMPRESSION:  1.  Stable mild cardiomegaly.   Original Report Authenticated By: D. Andria Rhein, MD   Medications: Scheduled Meds: . acetaminophen  650 mg Oral Once  . deferoxamine (DESFERAL) IV  2,000 mg Intravenous QHS  . escitalopram  5 mg Oral Daily  . folic acid  1 mg Oral Daily  . hydroxyurea  1,000 mg Oral QODAY  . hydroxyurea  500 mg Oral QODAY  .  ibuprofen  800 mg Oral TID  . levofloxacin  750 mg Oral Q24H  . mirtazapine  7.5 mg Oral QHS  . oxyCODONE  10 mg Oral Q4H  . polyethylene glycol  17 g Oral Daily  . senna-docusate  1 tablet Oral BID   Continuous Infusions: . dextrose 5 % and 0.45% NaCl 50 mL/hr at 05/14/13 0139   PRN Meds:.acetaminophen, albuterol, diazepam, diphenhydrAMINE, HYDROmorphone (DILAUDID) injection, ipratropium, naloxone, ondansetron (ZOFRAN) IV, ondansetron, promethazine, promethazine, sodium chloride, sodium chloride, sodium chloride  Assessment/Plan: @PROBHOSP @  LOS: 12 days   Active Problems:  1. Hypoxemia/ pneumonia: CT chest negative for PE, however, does show some infiltrates. Levaquin started. Patient remains hypoxic, oxygen and DuoNeb's ordered. Will  attempt weaning oxygen today. 2. Hb SS with Crisis: still complains of pain stable but unimproved, continue home regimen. No change in narcotic regimen today  3. Urinary tract infection: Urine culture is positive for Enterococcus in urine which is sensitive to Ampicillin. Levaquin substituted to cover lung  pathology. Enterococcus also sensitive to quinolones. 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 Repeat labs today.  5. Gait Abnormality: Improved. Pt independent in ambulation. 6. Depression: Pt seen by Psychiatrist. Appreciate input. Will continue Lexapro and Remeron as recommened.   Code Status: Full COde  Family Communication: N/A  Disposition Plan: Home in 24 hours   Kamran Coker 05/17/2013, 9:21 AM

## 2013-05-18 LAB — CBC
MCV: 100.5 fL — ABNORMAL HIGH (ref 78.0–100.0)
Platelets: 168 10*3/uL (ref 150–400)
RBC: 1.86 MIL/uL — ABNORMAL LOW (ref 3.87–5.11)
RDW: 25 % — ABNORMAL HIGH (ref 11.5–15.5)
WBC: 7.3 10*3/uL (ref 4.0–10.5)

## 2013-05-18 LAB — PREPARE RBC (CROSSMATCH)

## 2013-05-18 MED ORDER — HYDROMORPHONE HCL 4 MG PO TABS
4.0000 mg | ORAL_TABLET | ORAL | Status: DC
  Administered 2013-05-18 – 2013-05-19 (×5): 4 mg via ORAL
  Filled 2013-05-18 (×5): qty 1

## 2013-05-18 NOTE — Clinical Documentation Improvement (Signed)
  THIS DOCUMENT IS NOT A PERMANENT PART OF THE MEDICAL RECORD  Please update your documentation with the medical record to reflect your response to this query. If you need help knowing how to do this please call [phone].  05/18/13  Dear [name], Judie Petit [place],  In a better effort to capture your patient's severity of illness, reflect appropriate length of stay and utilization of resources, a review of the patient medical record has revealed the following indicators.    Based on your clinical judgment, please clarify and document in a progress note and/or discharge summary the clinical condition associated with the following supporting information:  In responding to this query please exercise your independent judgment.  The fact that a query is asked, does not imply that any particular answer is desired or expected.   Pt w/t sickle cell anemia, PNA, and hypoxic,  Clarification Needed   Please clarify if SOB, hypoxemia in setting of PNA and dyspnea can be further specified as one of the diagnoses listed below and document in pn or d/c summary.      Possible Clinical Conditions?  _______Acute Respiratory Failure _______Acute on Chronic Respiratory Failure _______Chronic Respiratory Failure _______Acute Chest Syndrome _______Acute Respiratory Insufficiency following surgery or trauma _______Other Condition________________ _______Cannot Clinically Determine    Supporting Information:  Risk Factors: Sickle Cell Pneumonia UTI  Signs&Symptoms: PN 05/15/13 Pt's O2 sats on room air at rest were between 85 & 88%  PN 05/14/13 Hypoxemia: Pt has had hypoxemia in the last 48 hours.  She does have some mild crackles in the left base.  Lungs: Mild crackles at left base of lungs.   05/13/13 Patient is having shortness of breath and has dropped her oxygen saturation in to the 80s with mobility.  #1 Hypoxia: Seems dyspnea on exertion.   Diagnostics: ABG's       Lab:  Radiology: CT angio Chest 05/14/13 There is also mild patchy infiltrate in the inferior lingula.  Areas of patchy infiltrate in both lower lobes and inferior lingula.  Patient remains hypoxic, oxygen and DuoNeb's ordered.  Hypoxemia/ pneumonia  Treatment: Oxygen: O2 concentrations: 2L  O2 Mode: per [place]  Respiratory Treatment:   Treatment: albuterol (PROVENTIL) (5 MG/ML) 0.5% nebulizer solution 2.5 mg  02 Sats   You may use possible, probable, or suspect with inpatient documentation. possible, probable, suspected diagnoses MUST be documented at the time of discharge  Reviewed: additional documentation in the medical record ljh  Thank You,  Enis Slipper  RN, BSN, MSN/Inf, CCDS Clinical Documentation Specialist [facility] Dept Pager: [phone] / E-mail: [email or web address]  [phone] Health Information Management [place]

## 2013-05-18 NOTE — Progress Notes (Signed)
Clinical Social Work Progress Note PSYCHIATRY SERVICE LINE 05/18/2013  Patient:  Dawn [name]  Account:  [id number]  Admit Date:  05/05/2013  Clinical Social Worker:  Unk Lightning, LCSW  Date/Time:  05/18/2013 10:00 AM  Review of Patient  Overall Medical Condition:   Patient reports she is still in pain.   Participation Level:  Active  Participation Quality  Appropriate   Other Participation Quality:   Affect  Flat   Cognitive  Appropriate   Reaction to Medications/Concerns:   None reported   Modes of Intervention  Support   Summary of Progress/Plan at Discharge   CSW met with patient at bedside. Patient laying in bed and watching TV when CSW arrived.    Patient reports she has been hospitalized for almost two weeks and is anxious to DC. CSW and patient discussed patient's depression related to hospitalization. Patient reports that she is worried about finding a job once she DC. 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    CSW and patient discussed depression and how symptoms can reduce motivation. CSW encouraged patient to follow up on outpatient basis with therapy and medication management along with job searching. Patient agreeable to plan and reports no further needs at this time.    CSW will continue to follow.

## 2013-05-18 NOTE — Progress Notes (Signed)
Critical lab value called for pt's hgb 6.8. Attending  Reidler notified, no new orders given at this time. Incoming shift RN to be notified.

## 2013-05-18 NOTE — Progress Notes (Signed)
Subjective: Patient is still Hypoxic without oxygen. Had workup for her hypoxia indicating possible pneumonia for which she is on Levaquin. No fever or chills today. No worsening WBC. She has been able to walk around. Continues to have pain at 6/10. She has been getting good relief with the dilaudid. Not getting much relief with the oxycodone. She will like to try Dilaudid PO at DC. Patient's hemoglobin is noted to have dropped to 6.8 from 7.4. She normally has hemoglobin around 8.0 to 8.5g.  Objective: Vital signs in last 24 hours: Temp:  [98.3 F (36.8 C)-99.1 F (37.3 C)] 98.3 F (36.8 C) (08/25 0550) Pulse Rate:  [88-95] 88 (08/25 0550) Resp:  [18] 18 (08/25 0550) BP: (95-120)/(60-78) 95/60 mmHg (08/25 0550) SpO2:  [95 %-97 %] 96 % (08/25 0550) Weight change:  Last BM Date: 05/17/13  Intake/Output from previous day: 08/24 0701 - 08/25 0700 In: 950 [P.O.:950] Out: -  Intake/Output this shift:    General appearance: alert, cooperative and no distress Eyes: conjunctivae/corneas clear. PERRL, EOM's intact. Fundi benign. Neck: no adenopathy, no carotid bruit, no JVD, supple, symmetrical, trachea midline and thyroid not enlarged, symmetric, no tenderness/mass/nodules Back: symmetric, no curvature. ROM normal. No CVA tenderness. Resp: clear to auscultation bilaterally Chest wall: no tenderness Cardio: regular rate and rhythm, S1, S2 normal, no murmur, click, rub or gallop GI: soft, non-tender; bowel sounds normal; no masses,  no organomegaly Extremities: extremities normal, atraumatic, no cyanosis or edema Pulses: 2+ and symmetric Skin: Skin color, texture, turgor normal. No rashes or lesions Neurologic: Grossly normal  Lab Results:  Recent Labs  05/18/13 0500  WBC 7.3  HGB 6.8*  HCT 18.7*  PLT 168   BMET  Recent Labs  05/17/13 1025  NA 138  K 3.6  CL 103  CO2 28  GLUCOSE 126*  BUN 6  CREATININE 0.73  CALCIUM 8.8    Studies/Results: No results  found.  Medications: I have reviewed the patient's current medications.  Assessment/Plan: A 32 yo woman admitted with sickle cell painful crisis and hemolytic crisis. Also hypoxia and pneumonia.  #1 Sickle Cell Painful Crisis: Still having pain at 6/10. Continue dilaudid with the Oxycodone. Will try oral Dilaudid 4mg  Q 4hours. If that is working, Patient can try that at home in place of the Oxycodone.  #2 Hypoxia: We will set her up for home oxygen at 2 liters per minute. This may be temporary.  #3 sickle cell hemolytic anemia: H&H has dropped a little. We will transfuse 1 unit PRBC to bring her closer to her baseline. This may also help with her pain and hypoxia.  #4 Depression: Counseling provided. Continue anti depressants.  #5 Disposition: Will discharge in the morning with oxygen once H/H is stable.   LOS: 13 days   GARBA,LAWAL 05/18/2013, 10:28 AM

## 2013-05-18 NOTE — Progress Notes (Signed)
CRITICAL VALUE ALERT  Critical value received:  Hgb 6.8  Date of notification:  [id number]  Time of notification:  0530  Critical value read back:Y  Nurse who received alert:  [facility]  MD notified (1st page):  Reidler  Time of first page:  0535  MD notified (2nd page):  Time of second page:  Responding MD:  Reidler  Time MD responded:  [phone]

## 2013-05-19 ENCOUNTER — Encounter (HOSPITAL_COMMUNITY): Payer: Self-pay | Admitting: Internal Medicine

## 2013-05-19 DIAGNOSIS — B952 Enterococcus as the cause of diseases classified elsewhere: Secondary | ICD-10-CM | POA: Diagnosis present

## 2013-05-19 DIAGNOSIS — D5701 Hb-SS disease with acute chest syndrome: Secondary | ICD-10-CM

## 2013-05-19 DIAGNOSIS — J189 Pneumonia, unspecified organism: Secondary | ICD-10-CM

## 2013-05-19 HISTORY — DX: Pneumonia, unspecified organism: J18.9

## 2013-05-19 LAB — CBC WITH DIFFERENTIAL/PLATELET
Basophils Absolute: 0.1 10*3/uL (ref 0.0–0.1)
Eosinophils Absolute: 0.2 10*3/uL (ref 0.0–0.7)
HCT: 22.7 % — ABNORMAL LOW (ref 36.0–46.0)
Lymphs Abs: 1.9 10*3/uL (ref 0.7–4.0)
MCH: 34.6 pg — ABNORMAL HIGH (ref 26.0–34.0)
MCHC: 35.7 g/dL (ref 30.0–36.0)
MCV: 97 fL (ref 78.0–100.0)
Monocytes Absolute: 1.1 10*3/uL — ABNORMAL HIGH (ref 0.1–1.0)
Neutro Abs: 3.1 10*3/uL (ref 1.7–7.7)
Platelets: 179 10*3/uL (ref 150–400)
RDW: 23.3 % — ABNORMAL HIGH (ref 11.5–15.5)
WBC: 6.4 10*3/uL (ref 4.0–10.5)

## 2013-05-19 LAB — TYPE AND SCREEN: Antibody Screen: POSITIVE

## 2013-05-19 LAB — COMPREHENSIVE METABOLIC PANEL
AST: 78 U/L — ABNORMAL HIGH (ref 0–37)
Albumin: 2.4 g/dL — ABNORMAL LOW (ref 3.5–5.2)
BUN: 5 mg/dL — ABNORMAL LOW (ref 6–23)
Calcium: 8.2 mg/dL — ABNORMAL LOW (ref 8.4–10.5)
Chloride: 106 mEq/L (ref 96–112)
Creatinine, Ser: 0.67 mg/dL (ref 0.50–1.10)
GFR calc non Af Amer: >90 mL/min (ref >90)
Total Bilirubin: 3.4 mg/dL — ABNORMAL HIGH (ref 0.3–1.2)

## 2013-05-19 MED ORDER — LEVOFLOXACIN 750 MG PO TABS: 750.0000 mg | ORAL_TABLET | ORAL | Status: DC

## 2013-05-19 NOTE — Progress Notes (Signed)
[facility]  [facility] is providing the following services: Oxygen  If patient discharges after hours, please call [phone].   Renard Hamper 05/19/2013, 11:43 AM

## 2013-05-19 NOTE — Discharge Summary (Signed)
Dawn [name] MRN: [id number] DOB/AGE: Feb 19, 1981 32 y.o.  Admit date: 05/05/2013 Discharge date: 05/19/2013  Primary Care Physician:  Dawn Martello A., MD   Discharge Diagnoses:   Patient Active Problem List   Diagnosis Date Noted  . 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 Fever, unspecified 05/07/2013    Priority: Medium  . Hb-SS disease with crisis 04/01/2013    Priority: Medium  . HCAP (healthcare-associated pneumonia) 05/19/2013  . Hb-Ss disease with acute chest syndrome 05/19/2013  . Enterococcus UTI 05/19/2013  . Hypoxemia 05/14/2013  . Hemoptysis, unspecified 05/10/2013  . Muscle spasm of back 05/09/2013  . Nausea with vomiting 05/09/2013  . 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 Hypokalemia 03/28/2012  . Hypoalbuminemia 03/28/2012  . 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 Leukocytosis 05/26/2011  . HTN (hypertension) 05/26/2011  . Elevated LFTs 04/27/2011  . Sickle cell anemia 04/27/2011  . Sickle cell disease 04/26/2011    DISCHARGE MEDICATION:   Medication List         cetirizine 10 MG tablet  Commonly known as:  ZYRTEC  Take 1 tablet (10 mg total) by mouth daily as needed. For allergies     deferasirox 500 MG disintegrating tablet  Commonly known as:  EXJADE  Take 1,500 mg by mouth daily before breakfast.     folic acid 1 MG tablet  Commonly known as:  FOLVITE  Take 1 mg by mouth daily.     hydroxyurea 500 MG capsule  Commonly known as:  HYDREA  Take 1-2 capsules (500-1,000 mg total) by mouth as directed. Take 500mg  (1 tablet) ON ODD DAYS OF THE MONTH AND 1000 MG (2) TABLETS ON THE EVEN DAYS.     levofloxacin 750 MG tablet  Commonly known as:  LEVAQUIN  Take 1 tablet (750 mg total) by mouth daily.     Oxycodone HCl 10 MG Tabs  Take 1 tablet (10 mg total) by mouth every 4 (four) hours as needed.     polyethylene glycol packet  Commonly known as:  MIRALAX / GLYCOLAX  Take 17 g by mouth daily as  needed (constipation).     promethazine 12.5 MG tablet  Commonly known as:  PHENERGAN  Take 1 tablet (12.5 mg total) by mouth every 6 (six) hours as needed for nausea. 1-2 prn for nausea begin with 1 if no relief may repeat x's 1     Vitamin D (Ergocalciferol) 50000 UNITS Caps capsule  Commonly known as:  DRISDOL  Take 1 capsule (50,000 Units total) by mouth every 7 (seven) days. Takes on Sundays.          Consults: Treatment Team:  Nehemiah Settle, MD   SIGNIFICANT DIAGNOSTIC STUDIES:  Dg Chest 2 View  05/13/2013   *RADIOLOGY REPORT*  Clinical Data: Short of breath.  Sickle cell anemia.  CHEST - 2 VIEW  Comparison: 8/718/2014.  Findings: Heart size within normal limits for projection. Unchanged left subclavian power port. Linear and patchy densities present in the left lower lobe.  Frontal views suggest that this is due to subsegmental atelectasis however on the lateral view, this is less clear and airspace disease is difficult to exclude. Cholecystectomy clips are present in the right upper quadrant. There is no pleural effusion.  IMPRESSION: New left lower lobe density favored to represent atelectasis over airspace disease/pneumonia.   Original Report Authenticated By: Andreas Newport, M.D.   Dg Chest 2 View  05/10/2013   *RADIOLOGY REPORT*  Clinical Data: Fever and  cough; sickle cell disease  CHEST - 2 VIEW  Comparison: May 06, 2013  Findings: Central catheter tip is in the superior vena cava.  No pneumothorax.  There is minimal bibasilar lung scarring.  Lungs are otherwise clear.  Heart size and pulmonary vascularity are normal. No adenopathy.  Bony structures appear intact.  IMPRESSION: Edema or consolidation.  Minimal bibasilar lung scarring.   Original Report Authenticated By: Bretta Bang, M.D.   Ct Angio Chest Pe W/cm &/or Wo Cm  05/14/2013   *RADIOLOGY REPORT*  Clinical Data: Shortness of breath; sickle cell disease; decreased oxygen saturation  CT ANGIOGRAPHY  CHEST  Technique:  Multidetector CT imaging of the chest using the standard protocol during bolus administration of intravenous contrast. Multiplanar reconstructed images including MIPs were obtained and reviewed to evaluate the vascular anatomy.  Contrast: OMNIPAQUE IOHEXOL 350 MG/ML SOLN  Comparison: Chest radiograph May 13, 2013  Findings: There is no demonstrable pulmonary embolus.  There is no thoracic aortic aneurysm or dissection.  There is patchy airspace consolidation in both lower lobes, slightly more on the left than on the right.  There is also mild patchy infiltrate in the inferior lingula.  There is no appreciable thoracic adenopathy.  Pericardium is not thickened.  In the upper abdomen, the spleen is atrophic and calcified. Visualized upper abdominal structures otherwise appear unremarkable.  There are sclerotic areas in multiple bony structures consistent with chronic anemia.  There are no areas of bony destruction. 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  Thyroid appears normal.  IMPRESSION: No demonstrable pulmonary embolus.  Areas of patchy infiltrate in both lower lobes and inferior lingula.  Sclerotic areas in bony structures consistent with chronic anemia.  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  Spleen is atrophic and calcified.  Comment:  Central catheter tip is in the superior vena cava.  No pneumothorax.   Original Report Authenticated By: Bretta Bang, M.D.   Dg Chest Port 1 View  05/06/2013   *RADIOLOGY REPORT*  Clinical Data: Fever and cough.  Sickle cell.  PORTABLE CHEST - 1 VIEW  Comparison: 02/23/2013  Findings: Left subclavian PowerPort remains in place, with a kink in the  tubing near the   costoclavicular ligament suggesting increased risk of pinch-off syndrome.  Lungs clear.  Stable mild cardiomegaly.  No definite effusion.  Sclerotic changes in bilateral humeral heads suggesting AVN.  IMPRESSION:  1.  Stable mild cardiomegaly.    Original Report Authenticated By: D. Andria Rhein, MD     BRIEF ADMITTING H & P: [name] is well known to me as a patient with Hb SS who is compliant with her disease management and who typically manages her symptoms of pain wellin the out patient setting with oral analgesics. She has been having increased pain typical of her pain of vaso-occlusive episodes and has received acute management in the out patient setting at the [facility]. Despite this her pain has persisted to the point that it has impaired her ambulation. Her pain is throbbing in nature and is at an intensity of 10/10. It is worse with ambulation and movement. She also has tenderness even to the slightest touch along the BLE's. She states that her oral medications decrease the pain to 9.5/10. She has had nausea but no emesis but has had poor oral intake and has been unable to effectively hydrate herself at home because of the nausea.  Her baseline pain is usually 3/10 which allows her to function at a  community level. She has a baseline WBC of 6.5, Hb of 8.5, reticulocyte count of 25% and bilirubin of 6.5.    Hospital Course:  Present on Admission:  . Acute Chest Syndrome: Pt developed a new infiltrate with hypoxemia. This was treated as HCAP however patient  had no clinical signs of Pneumonia. My clinical assessment is that patient had low grade acute chest syndrome associated with Sickle cell disease. The Hypoxemia is improving but patient will likley require oxygen at discharge.  . Hypoxemia: Associated with acute chest syndrome. Improving but will likely require Oxygen at discharge.  Marland Kitchen Hb-SS disease with crisis: Pt was treated for her crisis which has essentially been resolved for several days. She was placed on oral Dilaudid however at patient's request and after discussion with patient and reviewing her pattern of analgesic usage, patient will be discharged home on her usual home regimen of Oxycodone IR on a PRN basis.   . Sickle  cell hemolytic anemia: Pt had acute hemolysis and had a decrease in her Hb to a nadir of 6.6. She was transfused 1 unit of blood on 8/25 2014. Her Hb is now stable at 8.1.   . Enterococcus UTI: Pt was treated with Ampicillin for a full course  . (Resolved) Abnormality of gait: Secondary to Sickle Cell Crisis- Resolved.  .  (Resolved) Muscle spasm of back: Resolved with physical modalities  .  (resolved) Nausea with vomiting: Associated with sickle cell crisis and treatment of UTI.  Marland Kitchen Reactive depression (situational): Pt has had a breakup of her relationship with her boyfriend of 2 years and is still grieving the death of her baby several years ago. She was evaluated by Psychiatry and recommendations given for Pharmacotherapy as well as Counseling as an out patient.   Disposition and Follow-up:  Pt stable at time of discharge. She is  to follow up with [name] in clinic in 1 week.     Discharge Orders   Future Appointments Provider Department Dept Phone   05/27/2013 1:15 PM Altha Harm, MD [facility] [phone]   Future Orders Complete By Expires   Activity as tolerated - No restrictions  As directed    Diet general  As directed       DISCHARGE EXAM:  General: Alert, awake, oriented x3, in moderate acute distress due to pain. Vital Signs: BP 110/71, HR 88, T 97.3 F (36.3 C), temperature source Oral, RR 18, height 5\' 3"  (1.6 m), weight 154 lb 8.7 oz (70.1 kg), SpO2 97.00%. HEENT: [place]/AT PEERL, EOMI, mild icterus at baseline  OROPHARYNX: Moist mucosam No exudate/ erythema/lesions.  Heart:  S1S2 normal, without murmurs, rubs, gallops, PMI non-displaced, no heaves or thrills on palpation.  Lungs: Clear to auscultation but decreased BS at bases, no wheezing or rhonchi noted.  Abdomen: Soft, nontender, nondistended, positive bowel sounds, no masses no hepatosplenomegaly noted.  Neuro: No focal neurological deficits noted cranial nerves II through XII  grossly intact. DTRs 2+ bilaterally upper and lower extremities. Strength normal in bilateral upper but unable to evaluate lower extremities due to pain.  Musculoskeletal: No warm swelling or erythema around joints.  Psychiatric: Patient alert and oriented x3, good insight and cognition, good recent to remote recall.      Recent Labs  05/17/13 1025 05/19/13 0450  NA 138 139  K 3.6 3.5  CL 103 106  CO2 28 27  GLUCOSE 126* 138*  BUN 6 5*  CREATININE 0.73 0.67  CALCIUM 8.8 8.2*  Recent Labs  05/19/13 0450  AST 78*  ALT 23  ALKPHOS 157*  BILITOT 3.4*  PROT 6.6  ALBUMIN 2.4*   No results found for this basename: LIPASE, AMYLASE,  in the last 72 hours  Recent Labs  05/18/13 0500 05/19/13 0450  WBC 7.3 6.4  NEUTROABS  --  3.1  HGB 6.8* 8.1*  HCT 18.7* 22.7*  MCV 100.5* 97.0  PLT 168 179   Total Time spent with patient is greater than 30 minutes.  Signed: Zhane Bluitt A. 05/19/2013, 9:56 AM

## 2013-05-19 NOTE — Progress Notes (Signed)
SATURATION QUALIFICATIONS: (This note is used to comply with regulatory documentation for home oxygen)  Patient Saturations on Room Air at Rest = 90%  Patient Saturations on Room Air while Ambulating = 87%  Patient Saturations on 2 Liters of oxygen while Ambulating = 94%  Please briefly explain why patient needs home oxygen:  Patient needs oxygen in order to maintain adequate oxygen saturations.  Philomena Doheny RN

## 2013-05-21 NOTE — H&P (Signed)
Pt seen and examined and discussed with NP Gwinda Passe agree with assessment and plan.

## 2013-05-21 NOTE — Discharge Summary (Signed)
Pt will likely need admission to the hospital. However she has no acute life or limb threatening clinical course at present and pt is requesting discharge. Despite my assessment to admit for further management, There are no compelling factors to insist on admission. Thus patient is being discharged home in stable but painful condition. Anticipate she will present in the next 24-48 hours requiring admission.

## 2013-05-27 ENCOUNTER — Encounter: Payer: Self-pay | Admitting: Internal Medicine

## 2013-05-27 ENCOUNTER — Ambulatory Visit (INDEPENDENT_AMBULATORY_CARE_PROVIDER_SITE_OTHER): Payer: Medicare Other | Admitting: Internal Medicine

## 2013-05-27 VITALS — BP 131/76 | HR 89 | Temp 98.3°F | Resp 16 | Ht 63.5 in | Wt 154.5 lb

## 2013-05-27 DIAGNOSIS — D571 Sickle-cell disease without crisis: Secondary | ICD-10-CM

## 2013-05-27 NOTE — Progress Notes (Signed)
  Subjective:    Patient ID: Dawn [name], female    DOB: 12-17-1980, 32 y.o.   MRN: [id number]  HPI Pt states that she has been feeling well since her discharge from the hospital. She has been doing her usual activity   Review of Systems  Constitutional: Negative.   HENT: Negative.   Eyes: Negative.   Respiratory: Negative.   Cardiovascular: Negative.   Gastrointestinal: Negative.   Endocrine: Negative.   Genitourinary: Negative.   Musculoskeletal: Negative for myalgias and arthralgias.  Skin: Negative.   Allergic/Immunologic: Negative.   Neurological: Negative.   Hematological: Negative.   Psychiatric/Behavioral: Negative.        Objective:   Physical Exam  Constitutional: She is oriented to person, place, and time. She appears well-developed and well-nourished.  HENT:  Head: Normocephalic and atraumatic.  Eyes: Conjunctivae and EOM are normal. Pupils are equal, round, and reactive to light. No scleral icterus.  Neck: Normal range of motion. Neck supple.  Cardiovascular: Normal rate and regular rhythm.  Exam reveals no gallop and no friction rub.   No murmur heard. Pulmonary/Chest: Effort normal and breath sounds normal. She has no wheezes. She has no rales.  Abdominal: Soft. Bowel sounds are normal. She exhibits no distension and no mass. There is no tenderness.  Musculoskeletal: Normal range of motion.  Neurological: She is alert and oriented to person, place, and time. No cranial nerve deficit.  Skin: Skin is warm and dry.  Psychiatric: She has a normal mood and affect. Her behavior is normal. Judgment and thought content normal.          Assessment & Plan:  1. Hb SS: Pt has not had any pain since discharge. She has not had need for narcotic analgesics. She has been feeling well. She has been compliant with Folic acid and Hydrea.   Pt should continue current medications on a PRN basis.  2. Situational depression: Pt has not yet followed up with [facility] counseling  but plans to do so in the next few weeks. She is of normal affect and disposition.  Plan to RTC in 3 months.  Labs: CBC with diff in one month

## 2013-06-26 ENCOUNTER — Other Ambulatory Visit: Payer: Medicare Other | Admitting: *Deleted

## 2013-06-26 DIAGNOSIS — D571 Sickle-cell disease without crisis: Secondary | ICD-10-CM | POA: Diagnosis not present

## 2013-06-27 LAB — CBC WITH DIFFERENTIAL/PLATELET
Hemoglobin: 9.2 g/dL — ABNORMAL LOW (ref 12.0–15.0)
Lymphocytes Relative: 46 % (ref 12–46)
Lymphs Abs: 3.1 10*3/uL (ref 0.7–4.0)
MCH: 36.5 pg — ABNORMAL HIGH (ref 26.0–34.0)
MCV: 102 fL — ABNORMAL HIGH (ref 78.0–100.0)
Monocytes Relative: 17 % — ABNORMAL HIGH (ref 3–12)
Neutrophils Relative %: 35 % — ABNORMAL LOW (ref 43–77)
Platelets: 285 10*3/uL (ref 150–400)
RBC: 2.52 MIL/uL — ABNORMAL LOW (ref 3.87–5.11)
WBC: 6.8 10*3/uL (ref 4.0–10.5)

## 2013-07-23 ENCOUNTER — Emergency Department (HOSPITAL_COMMUNITY)
Admission: EM | Admit: 2013-07-23 | Discharge: 2013-07-23 | Disposition: A | Discharge status: Home / Self Care | Payer: Medicare Other | Source: Home / Self Care | Attending: Emergency Medicine | Admitting: Emergency Medicine

## 2013-07-23 ENCOUNTER — Encounter (HOSPITAL_COMMUNITY): Payer: Self-pay | Admitting: Emergency Medicine

## 2013-07-23 DIAGNOSIS — J9819 Other pulmonary collapse: Secondary | ICD-10-CM | POA: Diagnosis not present

## 2013-07-23 DIAGNOSIS — K5900 Constipation, unspecified: Secondary | ICD-10-CM | POA: Diagnosis present

## 2013-07-23 DIAGNOSIS — R079 Chest pain, unspecified: Secondary | ICD-10-CM | POA: Diagnosis not present

## 2013-07-23 DIAGNOSIS — E876 Hypokalemia: Secondary | ICD-10-CM | POA: Diagnosis not present

## 2013-07-23 DIAGNOSIS — T80218A Other infection due to central venous catheter, initial encounter: Secondary | ICD-10-CM | POA: Diagnosis not present

## 2013-07-23 DIAGNOSIS — I10 Essential (primary) hypertension: Secondary | ICD-10-CM | POA: Diagnosis not present

## 2013-07-23 DIAGNOSIS — Z91040 Latex allergy status: Secondary | ICD-10-CM | POA: Insufficient documentation

## 2013-07-23 DIAGNOSIS — D5700 Hb-SS disease with crisis, unspecified: Secondary | ICD-10-CM | POA: Diagnosis not present

## 2013-07-23 DIAGNOSIS — D72829 Elevated white blood cell count, unspecified: Secondary | ICD-10-CM | POA: Diagnosis present

## 2013-07-23 DIAGNOSIS — Z79899 Other long term (current) drug therapy: Secondary | ICD-10-CM | POA: Insufficient documentation

## 2013-07-23 DIAGNOSIS — I319 Disease of pericardium, unspecified: Secondary | ICD-10-CM | POA: Diagnosis not present

## 2013-07-23 DIAGNOSIS — R509 Fever, unspecified: Secondary | ICD-10-CM | POA: Diagnosis not present

## 2013-07-23 DIAGNOSIS — D649 Anemia, unspecified: Secondary | ICD-10-CM | POA: Diagnosis not present

## 2013-07-23 DIAGNOSIS — F411 Generalized anxiety disorder: Secondary | ICD-10-CM | POA: Diagnosis present

## 2013-07-23 DIAGNOSIS — Z452 Encounter for adjustment and management of vascular access device: Secondary | ICD-10-CM | POA: Diagnosis not present

## 2013-07-23 DIAGNOSIS — Z8659 Personal history of other mental and behavioral disorders: Secondary | ICD-10-CM | POA: Insufficient documentation

## 2013-07-23 DIAGNOSIS — Z23 Encounter for immunization: Secondary | ICD-10-CM | POA: Diagnosis not present

## 2013-07-23 DIAGNOSIS — Z8249 Family history of ischemic heart disease and other diseases of the circulatory system: Secondary | ICD-10-CM | POA: Diagnosis not present

## 2013-07-23 DIAGNOSIS — R52 Pain, unspecified: Secondary | ICD-10-CM | POA: Diagnosis present

## 2013-07-23 DIAGNOSIS — R000 Tachycardia, unspecified: Secondary | ICD-10-CM | POA: Diagnosis not present

## 2013-07-23 DIAGNOSIS — B3789 Other sites of candidiasis: Secondary | ICD-10-CM | POA: Diagnosis not present

## 2013-07-23 DIAGNOSIS — Z8701 Personal history of pneumonia (recurrent): Secondary | ICD-10-CM | POA: Insufficient documentation

## 2013-07-23 DIAGNOSIS — D571 Sickle-cell disease without crisis: Secondary | ICD-10-CM | POA: Diagnosis not present

## 2013-07-23 DIAGNOSIS — I059 Rheumatic mitral valve disease, unspecified: Secondary | ICD-10-CM | POA: Diagnosis not present

## 2013-07-23 DIAGNOSIS — B49 Unspecified mycosis: Secondary | ICD-10-CM | POA: Diagnosis not present

## 2013-07-23 DIAGNOSIS — Z833 Family history of diabetes mellitus: Secondary | ICD-10-CM | POA: Diagnosis not present

## 2013-07-23 LAB — BASIC METABOLIC PANEL
BUN: 6 mg/dL (ref 6–23)
CO2: 21 mEq/L (ref 19–32)
Chloride: 102 mEq/L (ref 96–112)
Creatinine, Ser: 0.53 mg/dL (ref 0.50–1.10)
Glucose, Bld: 163 mg/dL — ABNORMAL HIGH (ref 70–99)
Potassium: 3.4 mEq/L — ABNORMAL LOW (ref 3.5–5.1)

## 2013-07-23 LAB — RETICULOCYTES: Retic Count, Absolute: 694.3 10*3/uL — ABNORMAL HIGH (ref 19.0–186.0)

## 2013-07-23 LAB — CBC
HCT: 20.4 % — ABNORMAL LOW (ref 36.0–46.0)
Hemoglobin: 7.5 g/dL — ABNORMAL LOW (ref 12.0–15.0)
MCV: 100.5 fL — ABNORMAL HIGH (ref 78.0–100.0)
WBC: 12.6 10*3/uL — ABNORMAL HIGH (ref 4.0–10.5)

## 2013-07-23 MED ORDER — OXYCODONE HCL 5 MG PO TABS: 5.0000 mg | ORAL_TABLET | ORAL | Status: DC | PRN

## 2013-07-23 MED ORDER — HYDROMORPHONE HCL PF 2 MG/ML IJ SOLN
2.0000 mg | Freq: Once | INTRAMUSCULAR | Status: AC
  Administered 2013-07-23: 2 mg via INTRAVENOUS
  Filled 2013-07-23: qty 1

## 2013-07-23 MED ORDER — KETOROLAC TROMETHAMINE 30 MG/ML IJ SOLN
30.0000 mg | Freq: Once | INTRAMUSCULAR | Status: AC
  Administered 2013-07-23: 30 mg via INTRAVENOUS
  Filled 2013-07-23: qty 1

## 2013-07-23 MED ORDER — HYDROMORPHONE HCL PF 1 MG/ML IJ SOLN
1.0000 mg | Freq: Once | INTRAMUSCULAR | Status: AC
  Administered 2013-07-23: 1 mg via INTRAVENOUS
  Filled 2013-07-23: qty 1

## 2013-07-23 MED ORDER — HEPARIN SOD (PORK) LOCK FLUSH 100 UNIT/ML IV SOLN
500.0000 [IU] | Freq: Once | INTRAVENOUS | Status: AC
  Administered 2013-07-23: 500 [IU] via INTRAVENOUS
  Filled 2013-07-23: qty 5

## 2013-07-23 MED ORDER — OXYCODONE HCL 5 MG PO TABS
5.0000 mg | ORAL_TABLET | Freq: Once | ORAL | Status: AC
  Administered 2013-07-23: 5 mg via ORAL
  Filled 2013-07-23: qty 1

## 2013-07-23 MED ORDER — PROMETHAZINE HCL 25 MG/ML IJ SOLN
25.0000 mg | Freq: Once | INTRAMUSCULAR | Status: AC
  Administered 2013-07-23: 25 mg via INTRAVENOUS
  Filled 2013-07-23: qty 1

## 2013-07-23 NOTE — ED Provider Notes (Signed)
CSN: [id number]     Arrival date & time 07/23/13  8469 History   First MD Initiated Contact with Patient 07/23/13 0401     Chief Complaint  Patient presents with  . Sickle Cell Pain Crisis    HPI Patient has a history of sickle cell disease type SS.  She presents with what she describes as typical sickle cell crisis pain with pain in her back and legs.  She denies fevers and chills.  No chest pain or shortness of breath.  No headache or neck pain.  No abdominal pain.  No urinary complaints.  Her pain is severe in severity.  She tried an OxyContin at home without improvement in her symptoms. Review of the medical record demonstrates that the patient is using well controlled at home with outpatient oral pain medicine.  She follows with Dr. Marthann Schiller.     Past Medical History  Diagnosis Date  . Sickle cell disease   . Sickle cell disease, type S   . Blood transfusion   . 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 HCAP (healthcare-associated pneumonia) 05/19/2013   Past Surgical History  Procedure Laterality Date  . Cholecystectomy    .  left knee acl reconstruction    . Cesarean section      x 2  . Portacath placement    . Portacath placement Left 02/23/2013    Procedure: INSERTION PORT-A-CATH;  Surgeon: Fabio Bering, MD;  Location: [facility];  Service: General;  Laterality: Left;   Family History  Problem Relation Age of Onset  . Sickle cell trait Mother   . Sickle cell trait Father   . Hypertension Father   . Diabetes Father   . Sickle cell trait Sister   . Cancer Paternal Aunt     Breast   History  Substance Use Topics  . Smoking status: Never Smoker   . Smokeless tobacco: Not on file  . Alcohol Use: No   OB History   Grav Para Term Preterm Abortions TAB SAB Ect Mult Living                 Review of Systems  All other systems reviewed and are negative.    Allergies  Desferal; Latex; Lisinopril; and Tape  Home Medications   Current Outpatient Rx   Name  Route  Sig  Dispense  Refill  . cetirizine (ZYRTEC) 10 MG tablet   Oral   Take 1 tablet (10 mg total) by mouth daily as needed. For allergies   30 tablet   2   . deferasirox (EXJADE) 500 MG disintegrating tablet   Oral   Take 1,500 mg by mouth daily before breakfast.         . folic acid (FOLVITE) 1 MG tablet   Oral   Take 1 mg by mouth daily.           . hydroxyurea (HYDREA) 500 MG capsule   Oral   Take 1-2 capsules (500-1,000 mg total) by mouth as directed. Take 500mg  (1 tablet) ON ODD DAYS OF THE MONTH AND 1000 MG (2) TABLETS ON THE EVEN DAYS.   46 capsule   2   . Oxycodone HCl 10 MG TABS   Oral   Take 1 tablet (10 mg total) by mouth every 4 (four) hours as needed.   60 tablet   0   . polyethylene glycol (MIRALAX / GLYCOLAX) packet   Oral   Take 17 g by mouth daily as needed (constipation).         Marland Kitchen  promethazine (PHENERGAN) 12.5 MG tablet   Oral   Take 1 tablet (12.5 mg total) by mouth every 6 (six) hours as needed for nausea. 1-2 prn for nausea begin with 1 if no relief may repeat x's 1   45 tablet   0   . Vitamin D, Ergocalciferol, (DRISDOL) 50000 UNITS CAPS   Oral   Take 1 capsule (50,000 Units total) by mouth every 7 (seven) days. Takes on Sundays.   30 capsule   11    BP 131/67  Pulse 102  Temp(Src) 97.9 F (36.6 C) (Oral)  Resp 24  Ht 5\' 3"  (1.6 m)  Wt 154 lb (69.854 kg)  BMI 27.29 kg/m2  SpO2 94% Physical Exam  Nursing note and vitals reviewed. Constitutional: She is oriented to person, place, and time. She appears well-developed and well-nourished. No distress.  HENT:  Head: Normocephalic and atraumatic.  Eyes: EOM are normal.  Neck: Normal range of motion.  Cardiovascular: Normal rate, regular rhythm and normal heart sounds.   Pulmonary/Chest: Effort normal and breath sounds normal.  Abdominal: Soft. She exhibits no distension. There is no tenderness.  Musculoskeletal: Normal range of motion.  Neurological: She is alert and  oriented to person, place, and time.  Skin: Skin is warm and dry.  Psychiatric: She has a normal mood and affect. Judgment normal.    ED Course  Procedures (including critical care time) Labs Review Labs Reviewed  CBC - Abnormal; Notable for the following:    WBC 12.6 (*)    RBC 2.03 (*)    Hemoglobin 7.5 (*)    HCT 20.4 (*)    MCV 100.5 (*)    MCH 36.9 (*)    MCHC 36.8 (*)    RDW 23.9 (*)    All other components within normal limits  BASIC METABOLIC PANEL - Abnormal; Notable for the following:    Potassium 3.4 (*)    Glucose, Bld 163 (*)    All other components within normal limits  RETICULOCYTES - Abnormal; Notable for the following:    RBC. 2.03 (*)    All other components within normal limits   Imaging Review No results found.  EKG Interpretation   None       MDM   1. Sickle cell pain crisis   2. Anemia    4:33 AM No chest pain or shortness of breath.  Vital signs without significant abnormality.  Typical sickle cell pain.  We'll treat her pain at this time.  Labs pending.  Port accessed  5:58 AM Patient's pain is significantly improved at this time.  She'll be written a short course of immediate release oxycodone for breakthrough pain to be taken at home.  She has oxycodone release at home.  She'll contact her sickle cell physician Dr. Marthann Schiller today regarding her recent care.  Close followup.  She understands return to the ER for new or worsening symptoms.  Patient hemoglobin is 7.5.  This will not require transfusion at this time but will need to be monitored closely.    Lyanne Co, MD 07/23/13 [phone]

## 2013-07-23 NOTE — ED Notes (Signed)
Pt reports severe pain beginning at roughly 00:30 this morning. Reports taking an ER OxyContin with no relief.

## 2013-07-25 ENCOUNTER — Emergency Department (HOSPITAL_COMMUNITY): Payer: Medicare Other

## 2013-07-25 ENCOUNTER — Inpatient Hospital Stay (HOSPITAL_COMMUNITY)
Admission: EM | Admit: 2013-07-25 | Discharge: 2013-08-10 | DRG: 812 | Disposition: A | Discharge status: Home / Self Care | Payer: Medicare Other | Attending: Family Medicine | Admitting: Family Medicine

## 2013-07-25 ENCOUNTER — Encounter (HOSPITAL_COMMUNITY): Payer: Self-pay | Admitting: Emergency Medicine

## 2013-07-25 DIAGNOSIS — Z8249 Family history of ischemic heart disease and other diseases of the circulatory system: Secondary | ICD-10-CM

## 2013-07-25 DIAGNOSIS — B3789 Other sites of candidiasis: Secondary | ICD-10-CM | POA: Diagnosis present

## 2013-07-25 DIAGNOSIS — R7989 Other specified abnormal findings of blood chemistry: Secondary | ICD-10-CM

## 2013-07-25 DIAGNOSIS — D72829 Elevated white blood cell count, unspecified: Secondary | ICD-10-CM | POA: Diagnosis present

## 2013-07-25 DIAGNOSIS — F329 Major depressive disorder, single episode, unspecified: Secondary | ICD-10-CM

## 2013-07-25 DIAGNOSIS — F411 Generalized anxiety disorder: Secondary | ICD-10-CM | POA: Diagnosis present

## 2013-07-25 DIAGNOSIS — B49 Unspecified mycosis: Secondary | ICD-10-CM | POA: Diagnosis not present

## 2013-07-25 DIAGNOSIS — D5701 Hb-SS disease with acute chest syndrome: Secondary | ICD-10-CM

## 2013-07-25 DIAGNOSIS — D5700 Hb-SS disease with crisis, unspecified: Principal | ICD-10-CM | POA: Diagnosis present

## 2013-07-25 DIAGNOSIS — R52 Pain, unspecified: Secondary | ICD-10-CM | POA: Diagnosis present

## 2013-07-25 DIAGNOSIS — D571 Sickle-cell disease without crisis: Secondary | ICD-10-CM | POA: Diagnosis present

## 2013-07-25 DIAGNOSIS — R0902 Hypoxemia: Secondary | ICD-10-CM

## 2013-07-25 DIAGNOSIS — T80218A Other infection due to central venous catheter, initial encounter: Secondary | ICD-10-CM | POA: Diagnosis present

## 2013-07-25 DIAGNOSIS — E8809 Other disorders of plasma-protein metabolism, not elsewhere classified: Secondary | ICD-10-CM

## 2013-07-25 DIAGNOSIS — R509 Fever, unspecified: Secondary | ICD-10-CM | POA: Diagnosis not present

## 2013-07-25 DIAGNOSIS — K5900 Constipation, unspecified: Secondary | ICD-10-CM | POA: Diagnosis present

## 2013-07-25 DIAGNOSIS — R079 Chest pain, unspecified: Secondary | ICD-10-CM | POA: Diagnosis present

## 2013-07-25 DIAGNOSIS — Y849 Medical procedure, unspecified as the cause of abnormal reaction of the patient, or of later complication, without mention of misadventure at the time of the procedure: Secondary | ICD-10-CM | POA: Diagnosis present

## 2013-07-25 DIAGNOSIS — I10 Essential (primary) hypertension: Secondary | ICD-10-CM | POA: Diagnosis present

## 2013-07-25 DIAGNOSIS — E876 Hypokalemia: Secondary | ICD-10-CM

## 2013-07-25 DIAGNOSIS — R000 Tachycardia, unspecified: Secondary | ICD-10-CM | POA: Diagnosis not present

## 2013-07-25 DIAGNOSIS — R9389 Abnormal findings on diagnostic imaging of other specified body structures: Secondary | ICD-10-CM | POA: Diagnosis not present

## 2013-07-25 DIAGNOSIS — Z23 Encounter for immunization: Secondary | ICD-10-CM

## 2013-07-25 DIAGNOSIS — Z833 Family history of diabetes mellitus: Secondary | ICD-10-CM

## 2013-07-25 LAB — CBC WITH DIFFERENTIAL/PLATELET
Basophils Absolute: 0 10*3/uL (ref 0.0–0.1)
Basophils Relative: 0 % (ref 0–1)
Eosinophils Relative: 0 % (ref 0–5)
HCT: 22.6 % — ABNORMAL LOW (ref 36.0–46.0)
Hemoglobin: 8.4 g/dL — ABNORMAL LOW (ref 12.0–15.0)
Lymphocytes Relative: 22 % (ref 12–46)
Monocytes Relative: 11 % (ref 3–12)
Neutro Abs: 7.1 10*3/uL (ref 1.7–7.7)
RBC: 2.22 MIL/uL — ABNORMAL LOW (ref 3.87–5.11)
RDW: 25 % — ABNORMAL HIGH (ref 11.5–15.5)
WBC: 10.6 10*3/uL — ABNORMAL HIGH (ref 4.0–10.5)

## 2013-07-25 LAB — URINE MICROSCOPIC-ADD ON

## 2013-07-25 LAB — URINALYSIS, ROUTINE W REFLEX MICROSCOPIC
Glucose, UA: NEGATIVE mg/dL
Ketones, ur: NEGATIVE mg/dL
pH: 6 (ref 5.0–8.0)

## 2013-07-25 LAB — COMPREHENSIVE METABOLIC PANEL
ALT: 23 U/L (ref 0–35)
Albumin: 3.1 g/dL — ABNORMAL LOW (ref 3.5–5.2)
Alkaline Phosphatase: 137 U/L — ABNORMAL HIGH (ref 39–117)
Chloride: 100 mEq/L (ref 96–112)
GFR calc Af Amer: >90 mL/min (ref >90)
Glucose, Bld: 88 mg/dL (ref 70–99)
Potassium: 3.6 mEq/L (ref 3.5–5.1)
Sodium: 134 mEq/L — ABNORMAL LOW (ref 135–145)
Total Bilirubin: 10.2 mg/dL — ABNORMAL HIGH (ref 0.3–1.2)
Total Protein: 7.6 g/dL (ref 6.0–8.3)

## 2013-07-25 MED ORDER — DEXTROSE 5 % IV SOLN
INTRAVENOUS | Status: AC
  Filled 2013-07-25: qty 10

## 2013-07-25 MED ORDER — FLEET ENEMA 7-19 GM/118ML RE ENEM: 1.0000 | ENEMA | Freq: Once | RECTAL | Status: AC | PRN

## 2013-07-25 MED ORDER — ONDANSETRON HCL 4 MG/2ML IJ SOLN
4.0000 mg | Freq: Once | INTRAMUSCULAR | Status: AC
  Administered 2013-07-25: 4 mg via INTRAVENOUS
  Filled 2013-07-25: qty 2

## 2013-07-25 MED ORDER — DIPHENHYDRAMINE HCL 12.5 MG/5ML PO ELIX: 12.5000 mg | ORAL_SOLUTION | Freq: Four times a day (QID) | ORAL | Status: DC | PRN

## 2013-07-25 MED ORDER — HYDROMORPHONE HCL PF 2 MG/ML IJ SOLN
2.0000 mg | Freq: Once | INTRAMUSCULAR | Status: AC
  Administered 2013-07-25: 2 mg via INTRAVENOUS
  Filled 2013-07-25: qty 1

## 2013-07-25 MED ORDER — PROMETHAZINE HCL 25 MG/ML IJ SOLN
25.0000 mg | INTRAMUSCULAR | Status: DC | PRN
  Administered 2013-07-25: 25 mg via INTRAVENOUS
  Filled 2013-07-25: qty 1

## 2013-07-25 MED ORDER — INFLUENZA VAC SPLIT QUAD 0.5 ML IM SUSP
0.5000 mL | INTRAMUSCULAR | Status: AC
  Administered 2013-07-26: 0.5 mL via INTRAMUSCULAR
  Filled 2013-07-25: qty 0.5

## 2013-07-25 MED ORDER — HYDROMORPHONE HCL PF 1 MG/ML IJ SOLN
3.0000 mg | INTRAMUSCULAR | Status: DC | PRN
  Administered 2013-07-25: 3 mg via INTRAVENOUS
  Filled 2013-07-25: qty 3

## 2013-07-25 MED ORDER — HYDROMORPHONE HCL PF 1 MG/ML IJ SOLN
2.0000 mg | INTRAMUSCULAR | Status: DC | PRN
  Administered 2013-07-25: 2 mg via INTRAVENOUS
  Filled 2013-07-25: qty 2

## 2013-07-25 MED ORDER — KCL IN DEXTROSE-NACL 20-5-0.45 MEQ/L-%-% IV SOLN
INTRAVENOUS | Status: DC
  Administered 2013-07-25 – 2013-08-01 (×12): via INTRAVENOUS
  Administered 2013-08-03: 20 mL/h via INTRAVENOUS
  Administered 2013-08-07 – 2013-08-09 (×3): via INTRAVENOUS

## 2013-07-25 MED ORDER — PANTOPRAZOLE SODIUM 40 MG IV SOLR
40.0000 mg | INTRAVENOUS | Status: DC
  Administered 2013-07-25 – 2013-07-29 (×5): 40 mg via INTRAVENOUS
  Filled 2013-07-25 (×5): qty 40

## 2013-07-25 MED ORDER — SODIUM CHLORIDE 0.9 % IV SOLN
Freq: Once | INTRAVENOUS | Status: AC
  Administered 2013-07-25: 17:00:00 via INTRAVENOUS

## 2013-07-25 MED ORDER — ENOXAPARIN SODIUM 40 MG/0.4ML ~~LOC~~ SOLN
40.0000 mg | SUBCUTANEOUS | Status: DC
  Administered 2013-07-26 (×2): 40 mg via SUBCUTANEOUS
  Filled 2013-07-25 (×2): qty 0.4

## 2013-07-25 MED ORDER — PROMETHAZINE HCL 25 MG/ML IJ SOLN
12.5000 mg | INTRAMUSCULAR | Status: DC | PRN
  Administered 2013-07-26 – 2013-08-10 (×61): 12.5 mg via INTRAVENOUS
  Filled 2013-07-25 (×64): qty 1

## 2013-07-25 MED ORDER — KETOROLAC TROMETHAMINE 30 MG/ML IJ SOLN
30.0000 mg | Freq: Once | INTRAMUSCULAR | Status: AC
  Administered 2013-07-25: 30 mg via INTRAVENOUS
  Filled 2013-07-25: qty 1

## 2013-07-25 MED ORDER — HYDROMORPHONE 0.3 MG/ML IV SOLN
INTRAVENOUS | Status: DC
  Administered 2013-07-25: via INTRAVENOUS
  Filled 2013-07-25: qty 25

## 2013-07-25 MED ORDER — DEXTROSE 5 % IV SOLN
1.0000 g | INTRAVENOUS | Status: DC
  Administered 2013-07-26 (×2): 1 g via INTRAVENOUS
  Filled 2013-07-25 (×3): qty 10

## 2013-07-25 MED ORDER — DIPHENHYDRAMINE HCL 50 MG/ML IJ SOLN
12.5000 mg | Freq: Four times a day (QID) | INTRAMUSCULAR | Status: DC | PRN
  Administered 2013-07-26 (×2): 12.5 mg via INTRAVENOUS
  Filled 2013-07-25 (×2): qty 1

## 2013-07-25 MED ORDER — FOLIC ACID 1 MG PO TABS
1.0000 mg | ORAL_TABLET | Freq: Every day | ORAL | Status: DC
  Administered 2013-07-26 – 2013-08-10 (×15): 1 mg via ORAL
  Filled 2013-07-25 (×16): qty 1

## 2013-07-25 MED ORDER — BISACODYL 5 MG PO TBEC
5.0000 mg | DELAYED_RELEASE_TABLET | Freq: Every day | ORAL | Status: DC | PRN
  Administered 2013-07-28 – 2013-07-29 (×2): 5 mg via ORAL
  Filled 2013-07-25 (×2): qty 1

## 2013-07-25 MED ORDER — KETOROLAC TROMETHAMINE 30 MG/ML IJ SOLN
15.0000 mg | Freq: Four times a day (QID) | INTRAMUSCULAR | Status: AC
  Administered 2013-07-26 (×4): 15 mg via INTRAVENOUS
  Filled 2013-07-25 (×8): qty 1

## 2013-07-25 MED ORDER — SODIUM CHLORIDE 0.9 % IV SOLN: INTRAVENOUS | Status: DC

## 2013-07-25 MED ORDER — DOCUSATE SODIUM 100 MG PO CAPS
100.0000 mg | ORAL_CAPSULE | Freq: Two times a day (BID) | ORAL | Status: DC
  Administered 2013-07-26 – 2013-07-30 (×8): 100 mg via ORAL
  Filled 2013-07-25 (×10): qty 1

## 2013-07-25 MED ORDER — PROMETHAZINE HCL 25 MG/ML IJ SOLN
25.0000 mg | Freq: Once | INTRAMUSCULAR | Status: AC
  Administered 2013-07-25: 25 mg via INTRAVENOUS
  Filled 2013-07-25: qty 1

## 2013-07-25 MED ORDER — SODIUM CHLORIDE 0.9 % IJ SOLN: 9.0000 mL | INTRAMUSCULAR | Status: DC | PRN

## 2013-07-25 MED ORDER — ONDANSETRON HCL 4 MG/2ML IJ SOLN: 4.0000 mg | Freq: Three times a day (TID) | INTRAMUSCULAR | Status: DC | PRN

## 2013-07-25 MED ORDER — NALOXONE HCL 0.4 MG/ML IJ SOLN: 0.4000 mg | INTRAMUSCULAR | Status: DC | PRN

## 2013-07-25 NOTE — H&P (Signed)
[facility] History and Physical  Dawn [name]  XBJ:[id number]  DOB: Aug 17, 1981   DOA: 07/25/2013   PCP:   [name],[name], MD   Chief Complaint:  Chest and back pain for 2 days       HPI: Dawn [name] is a 32 y.o. female.   Young [place] American lady with known hemoglobin SS, presents with the above symptoms; reports she's been having fevers since Thursday. Pain is not helped by her home medications. She she denies any cough or cold, frequency or dysuria. Denies any known precipitating factors for sickle cell crisis, believes it may be due to the change in season.  She's also been having pain and swelling of the right knee, although left knee is also painful    All systems negative except as marked bold or noted in the HPI;  Constitutional:    malaise, fever and chills. ;  Eyes:   eye pain, redness and discharge. ;  ENMT:   ear pain, hoarseness, nasal congestion, sinus pressure and sore throat. ;  Cardiovascular:     palpitations, diaphoresis, dyspnea and peripheral edema.  Respiratory:   cough, hemoptysis, wheezing and stridor. ;  Gastrointestinal:  Nausea due to pain meds, vomiting, diarrhea, constipation, abdominal pain, melena, blood in stool, hematemesis, jaundice and rectal bleeding. unusual weight loss..   Genitourinary:    frequency, dysuria, incontinence,flank pain and hematuria; Musculoskeletal:   back pain and neck pain.  Swelling knee and trauma.;  Skin: .  pruritus, rash, abrasions, bruising and skin lesion.; ulcerations Neuro:    headache, lightheadedness and neck stiffness.  weakness, altered level of consciousness, altered mental status, extremity weakness, burning feet, involuntary movement, seizure and syncope.  Psych:    anxiety, depression, insomnia, tearfulness, panic attacks, hallucinations, paranoia, suicidal or homicidal ideation    Past Medical History  Diagnosis Date  . Sickle cell disease   . Sickle cell disease, type S   . Blood transfusion    . 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 HCAP (healthcare-associated pneumonia) 05/19/2013    Past Surgical History  Procedure Laterality Date  . Cholecystectomy    .  left knee acl reconstruction    . Cesarean section      x 2  . Portacath placement    . Portacath placement Left 02/23/2013    Procedure: INSERTION PORT-A-CATH;  Surgeon: Fabio Bering, MD;  Location: [facility];  Service: General;  Laterality: Left;    Medications:  HOME MEDS: Prior to Admission medications   Medication Sig Start Date End Date Taking? Authorizing Provider  cetirizine (ZYRTEC) 10 MG tablet Take 1 tablet (10 mg total) by mouth daily as needed. For allergies 07/21/12  Yes Lizbeth Bark, FNP  deferasirox (EXJADE) 500 MG disintegrating tablet Take 1,500 mg by mouth daily before breakfast.   Yes Historical Provider, MD  folic acid (FOLVITE) 1 MG tablet Take 1 mg by mouth daily.     Yes Historical Provider, MD  hydroxyurea (HYDREA) 500 MG capsule Take 1-2 capsules (500-1,000 mg total) by mouth as directed. Take 500mg  (1 tablet) ON ODD DAYS OF THE MONTH AND 1000 MG (2) TABLETS ON THE EVEN DAYS. 03/04/13  Yes Altha Harm, MD  oxyCODONE (OXY IR/ROXICODONE) 5 MG immediate release tablet Take 1 tablet (5 mg total) by mouth every 4 (four) hours as needed for pain. 07/23/13  Yes Lyanne Co, MD  Oxycodone HCl 10 MG TABS Take 1 tablet (10 mg total) by mouth every 4 (four) hours  as needed. 04/01/13  Yes Altha Harm, MD  promethazine (PHENERGAN) 12.5 MG tablet Take 1 tablet (12.5 mg total) by mouth every 6 (six) hours as needed for nausea. 1-2 prn for nausea begin with 1 if no relief may repeat x's 1 05/01/13  Yes Grayce Sessions, NP  Vitamin D, Ergocalciferol, (DRISDOL) 50000 UNITS CAPS Take 1 capsule (50,000 Units total) by mouth every 7 (seven) days. Takes on Sundays. 03/02/13  Yes Altha Harm, MD     Allergies:  Allergies  Allergen Reactions  . Desferal [Deferoxamine] Hives    Local  reaction on arm only during infusion. Can take with benadryl   . Latex Other (See Comments)    REACTION: Pt experiences a burning sensation on contacted skin areas  . Lisinopril Other (See Comments) and Cough    REACTION: Sore/scratchy throat  . Tape Other (See Comments)    REACTION: Pt. Experiences a burning sensation on contacted skin areas    Social History:   reports that she has never smoked. She does not have any smokeless tobacco history on file. She reports that she does not drink alcohol or use illicit drugs.  Family History: Family History  Problem Relation Age of Onset  . Sickle cell trait Mother   . Sickle cell trait Father   . Hypertension Father   . Diabetes Father   . Sickle cell trait Sister   . Cancer Paternal Aunt     Breast     Physical Exam: Filed Vitals:   07/25/13 1555 07/25/13 1843 07/25/13 1912  BP: 115/59 116/43 126/67  Pulse: 110 107 99  Temp: 99 F (37.2 C) 98.6 F (37 C) 98.6 F (37 C)  TempSrc: Oral Oral Oral  Resp: 20 18 18   Height: 5\' 3"  (1.6 m)  5\' 3"  (1.6 m)  Weight: 69.854 kg (154 lb)  69.854 kg (154 lb)  SpO2: 94% 98% 91%   Blood pressure 126/67, pulse 99, temperature 98.6 F (37 C), temperature source Oral, resp. rate 18, height 5\' 3"  (1.6 m), weight 69.854 kg (154 lb), SpO2 91.00%. Body mass index is 27.29 kg/(m^2).   GEN:  Pleasant young African American lady lying bed in painful; cooperative with exam PSYCH:  alert and oriented x4;  affect is appropriate. HEENT: Mucous membranes pale and icteric; PERRLA; EOM intact; no cervical lymphadenopathy nor thyromegaly or carotid bruit; no JVD; Breasts:: Not examined CHEST WALL: No tenderness CHEST: Normal respiration, clear to auscultation bilaterally HEART: Tachycardic regular rhythm; 1/6 systolic murmur BACK: No kyphosis no scoliosis; no CVA tenderness ABDOMEN: Obese, soft non-tender; no masses, no organomegaly, normal abdominal bowel sounds; no pannus; no intertriginous  candida. Rectal Exam: Not done EXTREMITIES: Swollen tender right knee age-appropriate arthropathy of the hands and knees; no edema; no ulcerations.  Genitalia: not examined PULSES: 2+ and symmetric SKIN: Normal hydration no rash or ulceration CNS: Cranial nerves 2-12 grossly intact no focal lateralizing neurologic deficit   Labs on Admission:  Basic Metabolic Panel:  Recent Labs Lab 07/23/13 0404 07/25/13 1622  NA 136 134*  K 3.4* 3.6  CL 102 100  CO2 21 25  GLUCOSE 163* 88  BUN 6 6  CREATININE 0.53 0.57  CALCIUM 8.7 8.6   Liver Function Tests:  Recent Labs Lab 07/25/13 1622  AST 87*  ALT 23  ALKPHOS 137*  BILITOT 10.2*  PROT 7.6  ALBUMIN 3.1*   No results found for this basename: LIPASE, AMYLASE,  in the last 168 hours No results found  for this basename: AMMONIA,  in the last 168 hours CBC:  Recent Labs Lab 07/23/13 0404 07/25/13 1622  WBC 12.6* 10.6*  NEUTROABS  --  7.1  HGB 7.5* 8.4*  HCT 20.4* 22.6*  MCV 100.5* 101.8*  PLT 172 251   Cardiac Enzymes: No results found for this basename: CKTOTAL, CKMB, CKMBINDEX, TROPONINI,  in the last 168 hours BNP: No components found with this basename: POCBNP,  D-dimer: No components found with this basename: D-DIMER,  CBG: No results found for this basename: GLUCAP,  in the last 168 hours  Radiological Exams on Admission: Dg Chest 2 View  07/25/2013   CLINICAL DATA:  Low grade fever, chest pain, back pain for 3 days, history sickle cell anemia  EXAM: CHEST  2 VIEW  COMPARISON:  05/13/2013  FINDINGS: Left subclavian Port-A-Cath stable tip projecting over SVC.  Upper normal heart size.  Mediastinal contours and pulmonary vascularity normal.  Minimal chronic peribronchial thickening.  No acute infiltrate, pleural effusion or pneumothorax.  Stable osseous findings.  IMPRESSION: Chronic changes.  No acute abnormalities.   Electronically Signed   By: Ulyses Southward M.D.   On: 07/25/2013 17:14       Assessment/Plan  Active Problems:   Sickle cell anemia  acute sickle cell painful crisis with hemolysis   PLAN: IV fluids Pain control with high-dose Dilaudid drip Empiric antibiotic therapy Continuous pulse oximetry  Other plans as per orders.  Code Status: Full code    Dawn [name] Nocturnist [facility] Pager [phone]   07/25/2013, 8:28 PM

## 2013-07-25 NOTE — ED Provider Notes (Signed)
CSN: [id number]     Arrival date & time 07/25/13  1548 History   First MD Initiated Contact with Patient 07/25/13 1558     Chief Complaint  Patient presents with  . Back Pain  . Chest Pain   (Consider location/radiation/quality/duration/timing/severity/associated sxs/prior Treatment) Patient is a 32 y.o. female presenting with back pain and chest pain.  Back Pain Associated symptoms: chest pain and fever   Associated symptoms: no abdominal pain, no headaches, no numbness and no weakness   Chest Pain Associated symptoms: back pain and fever   Associated symptoms: no abdominal pain, no headache, no nausea, no numbness, no shortness of breath, not vomiting and no weakness    Patient with chest pain and back pain. She has sickle cell disease and this is her typical pain pattern. She states she began her a few days ago and was seen in the ED. She had some pain relief from the ED but was not able to manage at home. No cough. She states that she has had fevers up to 103 at home. No dysuria. No abdominal pain. No numbness or weakness. She has hemoglobin SS. Her baseline hemoglobin is around 8. Past Medical History  Diagnosis Date  . Sickle cell disease   . Sickle cell disease, type S   . Blood transfusion   . 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 HCAP (healthcare-associated pneumonia) 05/19/2013   Past Surgical History  Procedure Laterality Date  . Cholecystectomy    .  left knee acl reconstruction    . Cesarean section      x 2  . Portacath placement    . Portacath placement Left 02/23/2013    Procedure: INSERTION PORT-A-CATH;  Surgeon: Fabio Bering, MD;  Location: [facility];  Service: General;  Laterality: Left;   Family History  Problem Relation Age of Onset  . Sickle cell trait Mother   . Sickle cell trait Father   . Hypertension Father   . Diabetes Father   . Sickle cell trait Sister   . Cancer Paternal Aunt     Breast   History  Substance Use Topics  . Smoking  status: Never Smoker   . Smokeless tobacco: Not on file  . Alcohol Use: No   OB History   Grav Para Term Preterm Abortions TAB SAB Ect Mult Living                 Review of Systems  Constitutional: Positive for fever. Negative for activity change and appetite change.  Eyes: Negative for pain.  Respiratory: Negative for chest tightness and shortness of breath.   Cardiovascular: Positive for chest pain. Negative for leg swelling.  Gastrointestinal: Negative for nausea, vomiting, abdominal pain and diarrhea.  Genitourinary: Negative for flank pain.  Musculoskeletal: Positive for back pain. Negative for neck stiffness.  Skin: Negative for rash.  Neurological: Negative for weakness, numbness and headaches.  Psychiatric/Behavioral: Negative for behavioral problems.    Allergies  Desferal; Latex; Lisinopril; and Tape  Home Medications   Current Outpatient Rx  Name  Route  Sig  Dispense  Refill  . cetirizine (ZYRTEC) 10 MG tablet   Oral   Take 1 tablet (10 mg total) by mouth daily as needed. For allergies   30 tablet   2   . deferasirox (EXJADE) 500 MG disintegrating tablet   Oral   Take 1,500 mg by mouth daily before breakfast.         . folic acid (FOLVITE) 1 MG tablet  Oral   Take 1 mg by mouth daily.           . hydroxyurea (HYDREA) 500 MG capsule   Oral   Take 1-2 capsules (500-1,000 mg total) by mouth as directed. Take 500mg  (1 tablet) ON ODD DAYS OF THE MONTH AND 1000 MG (2) TABLETS ON THE EVEN DAYS.   46 capsule   2   . oxyCODONE (OXY IR/ROXICODONE) 5 MG immediate release tablet   Oral   Take 1 tablet (5 mg total) by mouth every 4 (four) hours as needed for pain.   30 tablet   0   . Oxycodone HCl 10 MG TABS   Oral   Take 1 tablet (10 mg total) by mouth every 4 (four) hours as needed.   60 tablet   0   . promethazine (PHENERGAN) 12.5 MG tablet   Oral   Take 1 tablet (12.5 mg total) by mouth every 6 (six) hours as needed for nausea. 1-2 prn for nausea  begin with 1 if no relief may repeat x's 1   45 tablet   0   . Vitamin D, Ergocalciferol, (DRISDOL) 50000 UNITS CAPS   Oral   Take 1 capsule (50,000 Units total) by mouth every 7 (seven) days. Takes on Sundays.   30 capsule   11    BP 115/59  Pulse 110  Temp(Src) 99 F (37.2 C) (Oral)  Resp 20  Ht 5\' 3"  (1.6 m)  Wt 154 lb (69.854 kg)  BMI 27.29 kg/m2  SpO2 94% Physical Exam  Nursing note and vitals reviewed. Constitutional: She is oriented to person, place, and time. She appears well-developed and well-nourished.  HENT:  Head: Normocephalic and atraumatic.  Eyes: EOM are normal. Pupils are equal, round, and reactive to light.  Neck: Normal range of motion. Neck supple.  Cardiovascular: Normal rate, regular rhythm and normal heart sounds.   No murmur heard. Pulmonary/Chest: Effort normal and breath sounds normal. No respiratory distress. She has no wheezes. She has no rales.  Port-A-Cath to left chest wall.  Abdominal: Soft. Bowel sounds are normal. She exhibits no distension. There is no tenderness. There is no rebound and no guarding.  Musculoskeletal: Normal range of motion.  Neurological: She is alert and oriented to person, place, and time. No cranial nerve deficit.  Skin: Skin is warm and dry.  Psychiatric: She has a normal mood and affect. Her speech is normal.    ED Course  Procedures (including critical care time) Labs Review Labs Reviewed  CBC WITH DIFFERENTIAL - Abnormal; Notable for the following:    WBC 10.6 (*)    RBC 2.22 (*)    Hemoglobin 8.4 (*)    HCT 22.6 (*)    MCV 101.8 (*)    MCH 37.8 (*)    MCHC 37.2 (*)    RDW 25.0 (*)    Monocytes Absolute 1.2 (*)    All other components within normal limits  COMPREHENSIVE METABOLIC PANEL - Abnormal; Notable for the following:    Sodium 134 (*)    Albumin 3.1 (*)    AST 87 (*)    Alkaline Phosphatase 137 (*)    Total Bilirubin 10.2 (*)    All other components within normal limits  URINALYSIS, ROUTINE  W REFLEX MICROSCOPIC - Abnormal; Notable for the following:    Color, Urine AMBER (*)    APPearance HAZY (*)    Hgb urine dipstick LARGE (*)    Bilirubin Urine LARGE (*)  Protein, ur >300 (*)    Urobilinogen, UA 2.0 (*)    All other components within normal limits  URINE MICROSCOPIC-ADD ON - Abnormal; Notable for the following:    Squamous Epithelial / LPF MANY (*)    Bacteria, UA FEW (*)    All other components within normal limits  URINE CULTURE  PREGNANCY, URINE   Imaging Review Dg Chest 2 View  07/25/2013   CLINICAL DATA:  Low grade fever, chest pain, back pain for 3 days, history sickle cell anemia  EXAM: CHEST  2 VIEW  COMPARISON:  05/13/2013  FINDINGS: Left subclavian Port-A-Cath stable tip projecting over SVC.  Upper normal heart size.  Mediastinal contours and pulmonary vascularity normal.  Minimal chronic peribronchial thickening.  No acute infiltrate, pleural effusion or pneumothorax.  Stable osseous findings.  IMPRESSION: Chronic changes.  No acute abnormalities.   Electronically Signed   By: Ulyses Southward M.D.   On: 07/25/2013 17:14    EKG Interpretation   None       MDM   1. Sickle cell anemia    Patient with sickle cell pain crisis. No relief with Dilaudid. A few bacteria the urine but not a clear infection. Culture will be sent. Will admit to internal medicine.    Juliet Rude. Rubin Payor, MD 07/25/13 1821

## 2013-07-25 NOTE — ED Notes (Signed)
Pt returns to ED for worsening chest and back pain secondary to sickle cell.  Pt was seen in the ED on Thursday, was treated and released feeling better, however pt states pain restarted last evening and worsening through out the day.   BBS clear and equal at this time. NAD noted at this time.  Pt ambulated to bathroom with one assist. Steady gait noted.

## 2013-07-25 NOTE — ED Notes (Signed)
Implanted port accessed, pt tolerated well.

## 2013-07-25 NOTE — ED Notes (Signed)
Pt stated she began having back pain and chest pain in Thursday, she was seen in the ed for the same on Thursday  And discharged.  Pain returned today. She stated she has taken her usual pain meds, short acting oxy and that is not helping her pain today. She has sickle cell.

## 2013-07-26 DIAGNOSIS — D571 Sickle-cell disease without crisis: Secondary | ICD-10-CM

## 2013-07-26 LAB — CBC
HCT: 18.7 % — ABNORMAL LOW (ref 36.0–46.0)
Hemoglobin: 6.7 g/dL — CL (ref 12.0–15.0)
MCH: 36.6 pg — ABNORMAL HIGH (ref 26.0–34.0)
MCHC: 35.8 g/dL (ref 30.0–36.0)
Platelets: 159 10*3/uL (ref 150–400)
WBC: 8.5 10*3/uL (ref 4.0–10.5)

## 2013-07-26 LAB — BASIC METABOLIC PANEL WITH GFR
BUN: 9 mg/dL (ref 6–23)
CO2: 27 meq/L (ref 19–32)
Calcium: 8.3 mg/dL — ABNORMAL LOW (ref 8.4–10.5)
Chloride: 101 meq/L (ref 96–112)
Creatinine, Ser: 0.62 mg/dL (ref 0.50–1.10)
GFR calc Af Amer: >90 mL/min
GFR calc non Af Amer: >90 mL/min
Glucose, Bld: 106 mg/dL — ABNORMAL HIGH (ref 70–99)
Potassium: 3.7 meq/L (ref 3.5–5.1)
Sodium: 133 meq/L — ABNORMAL LOW (ref 135–145)

## 2013-07-26 MED ORDER — LORAZEPAM 0.5 MG PO TABS
0.5000 mg | ORAL_TABLET | Freq: Four times a day (QID) | ORAL | Status: DC | PRN
  Administered 2013-07-26 – 2013-08-06 (×19): 0.5 mg via ORAL
  Filled 2013-07-26 (×17): qty 1

## 2013-07-26 MED ORDER — LORATADINE 10 MG PO TABS
10.0000 mg | ORAL_TABLET | Freq: Every day | ORAL | Status: DC
  Administered 2013-07-26 – 2013-08-10 (×15): 10 mg via ORAL
  Filled 2013-07-26 (×16): qty 1

## 2013-07-26 MED ORDER — ACETAMINOPHEN 325 MG PO TABS
650.0000 mg | ORAL_TABLET | Freq: Four times a day (QID) | ORAL | Status: DC | PRN
  Administered 2013-07-26 – 2013-07-28 (×4): 650 mg via ORAL
  Filled 2013-07-26 (×4): qty 2

## 2013-07-26 MED ORDER — HYDROXYUREA 500 MG PO CAPS: 500.0000 mg | ORAL_CAPSULE | ORAL | Status: DC

## 2013-07-26 MED ORDER — DEFERASIROX 500 MG PO TBSO
1500.0000 mg | ORAL_TABLET | Freq: Every day | ORAL | Status: DC
  Administered 2013-07-29 – 2013-08-09 (×9): 1500 mg via ORAL

## 2013-07-26 MED ORDER — HYDROMORPHONE HCL PF 1 MG/ML IJ SOLN
1.0000 mg | INTRAMUSCULAR | Status: DC | PRN
  Administered 2013-07-26 (×3): 1 mg via INTRAVENOUS
  Filled 2013-07-26 (×3): qty 1

## 2013-07-26 MED ORDER — DIPHENHYDRAMINE HCL 25 MG PO CAPS
25.0000 mg | ORAL_CAPSULE | Freq: Four times a day (QID) | ORAL | Status: DC | PRN
  Administered 2013-07-26 – 2013-08-06 (×10): 25 mg via ORAL
  Filled 2013-07-26 (×10): qty 1

## 2013-07-26 MED ORDER — OXYCODONE HCL 5 MG PO TABS
10.0000 mg | ORAL_TABLET | ORAL | Status: DC | PRN
  Administered 2013-07-27 – 2013-07-31 (×7): 10 mg via ORAL
  Filled 2013-07-26 (×7): qty 2

## 2013-07-26 MED ORDER — HYDROXYUREA 500 MG PO CAPS
1000.0000 mg | ORAL_CAPSULE | ORAL | Status: DC
  Administered 2013-07-26 – 2013-08-09 (×7): 1000 mg via ORAL
  Filled 2013-07-26 (×8): qty 2

## 2013-07-26 MED ORDER — HYDROMORPHONE HCL PF 1 MG/ML IJ SOLN
2.0000 mg | INTRAMUSCULAR | Status: DC | PRN
  Administered 2013-07-26 – 2013-07-27 (×6): 2 mg via INTRAVENOUS
  Filled 2013-07-26 (×6): qty 2

## 2013-07-26 MED ORDER — HYDROMORPHONE 0.3 MG/ML IV SOLN
INTRAVENOUS | Status: DC
  Administered 2013-07-26: 07:00:00 via INTRAVENOUS
  Administered 2013-07-26: 9.49 mg via INTRAVENOUS
  Administered 2013-07-26: 02:00:00 via INTRAVENOUS
  Filled 2013-07-26 (×2): qty 25

## 2013-07-26 MED ORDER — HYDROXYUREA 500 MG PO CAPS
500.0000 mg | ORAL_CAPSULE | ORAL | Status: DC
  Administered 2013-07-27 – 2013-08-10 (×8): 500 mg via ORAL
  Filled 2013-07-26 (×8): qty 1

## 2013-07-26 NOTE — Progress Notes (Signed)
[facility] PROGRESS NOTE  Dawn [name] BMW:[id number] DOB: 1980/10/14 DOA: 07/25/2013 PCP: [name],[name], MD  Assessment/Plan: 32 year old woman admitted for sickle cell pain crisis and fever. Maintained in the outpatient setting on oxycodone IR. Has been treated effectively in the past with Toradol adjunct to therapy, heating pad and bolus when necessary narcotics. Baseline hemoglobin appears to be 8.  1. Sickle cell anemia with acute pain crisis, hemolysis: Continue analgesics, fluids to hydration. At Toradol for adjunct therapy. Hemoglobin has dropped, likely a component of dilution from fluid. Check in the morning. If drops further will pursue transfusion. 2. Fever: Etiology unclear. Urinalysis was equivocal, cultures pending. Chest x-ray was negative. She was not hypoxic. Her sternal pain is consistent with her at usual crisis pain and I do not think a acute chest syndrome is highly likely at this point. 3. SSA : Continue Exjade, Foley catheter, hydroxyurea.   Continue analgesics, Toradol, IV fluids until hydrated. Add heating pad.  CBC in the morning. Transfuse if hemoglobin drops.   Continue ceftriaxone, add Zithromax  Will discuss with her primary care physician in the morning   Pending studies:   Urine culture  Code Status: full code DVT prophylaxis: Lovenox Family Communication: None present Disposition Plan: home when improved  [name], MD  [facility]  Pager [phone] If 7PM-7AM, please contact night-coverage at [email or web address], password [facility] 07/26/2013, 8:32 AM  LOS: 1 day   Summary: 32 year old woman with history of sickle cell anemia recently seen in the emergency department for chest and back pain, released with improvement. Returned 2 days later with recurrent chest, back pain and new onset of fever. Pain unrelieved by chronic medications, admitted for sickle cell crisis.  Consultants:    Procedures:    Antibiotics:  Ceftriaxone  11/2 >>   Zithromax 11/2 >>  HPI/Subjective: Complains of back pain, bilateral leg pain and sternal pain, all very similar to previous pain crises. Some pain with a deep breath.  Objective: Filed Vitals:   07/26/13 0328 07/26/13 0351 07/26/13 0423 07/26/13 0500  BP: 107/68     Pulse: 86     Temp:   97.5 F (36.4 C)   TempSrc: Oral  Oral   Resp: 16 11    Height:      Weight:    70.625 kg (155 lb 11.2 oz)  SpO2: 98% 97%      Intake/Output Summary (Last 24 hours) at 07/26/13 0832 Last data filed at 07/26/13 0245  Gross per 24 hour  Intake      0 ml  Output    400 ml  Net   -400 ml     Filed Weights   07/25/13 1555 07/25/13 1912 07/26/13 0500  Weight: 69.854 kg (154 lb) 69.854 kg (154 lb) 70.625 kg (155 lb 11.2 oz)    Exam:   Afebrile since admission. Vitals stable.  General: Appears calm, moderately uncomfortable. Nontoxic.  Respiratory: Clear to auscultation bilaterally. No wheezes, rales or rhonchi. Normal respiratory effort.  Cardiovascular: Regular rate and rhythm. No murmur, rub or gallop. No lower extremity edema.  Abdomen: Soft.  Musculoskeletal: I do not appreciate any swelling of either knee.  Data Reviewed:  Basic metabolic panel unremarkable  WBC now normal  Hemoglobin 8.4 >> 6.7  Urine pregnancy negative  Urinalysis equivocal  Chest x-ray no acute changes  Scheduled Meds: . cefTRIAXone (ROCEPHIN)  IV  1 g Intravenous Q24H  . docusate sodium  100 mg Oral BID  . enoxaparin (LOVENOX) injection  40  mg Subcutaneous Q24H  . folic acid  1 mg Oral Daily  . HYDROmorphone PCA 0.3 mg/mL   Intravenous Q4H  . influenza vac split quadrivalent PF  0.5 mL Intramuscular Tomorrow-1000  . ketorolac  15 mg Intravenous Q6H  . pantoprazole (PROTONIX) IV  40 mg Intravenous Q24H   Continuous Infusions: . dextrose 5 % and 0.45 % NaCl with KCl 20 mEq/L 125 mL/hr at 07/26/13 0351    Active Problems:   Sickle cell anemia   Time spent 20  minutes

## 2013-07-26 NOTE — Progress Notes (Signed)
Temp was 100.0 MD made aware. Tylenol was given. Rechecked after 60 minutes temp 102.6. MD aware. Awaiting order. Will continue to monitor.  Valinda Hoar RN

## 2013-07-26 NOTE — Progress Notes (Signed)
Patient Temp now 100.3. Patient states numbness around left side of mouth. Neuro assessment done. MD made aware. Will continue to monitor.   Valinda Hoar RN

## 2013-07-26 NOTE — Progress Notes (Signed)
CRITICAL VALUE ALERT  Critical value received:  HgB 6.7  Date of notification:  07/26/2013   Time of notification:  06:34  Critical value read back:yes  Nurse who received alert:  Cyndia Diver  MD notified (1st page):  [name]  Time of first page:  06:38  MD notified (2nd page):  Time of second page:  Responding MD:  [name]  Time MD responded:  06:48

## 2013-07-27 ENCOUNTER — Inpatient Hospital Stay (HOSPITAL_COMMUNITY): Payer: Medicare Other

## 2013-07-27 DIAGNOSIS — R509 Fever, unspecified: Secondary | ICD-10-CM

## 2013-07-27 LAB — CBC
Platelets: 189 10*3/uL (ref 150–400)
RBC: 1.69 MIL/uL — ABNORMAL LOW (ref 3.87–5.11)
RDW: 22.6 % — ABNORMAL HIGH (ref 11.5–15.5)
WBC: 10.1 10*3/uL (ref 4.0–10.5)

## 2013-07-27 LAB — URINE CULTURE: Colony Count: NO GROWTH

## 2013-07-27 LAB — BASIC METABOLIC PANEL
CO2: 23 mEq/L (ref 19–32)
Calcium: 8 mg/dL — ABNORMAL LOW (ref 8.4–10.5)
Chloride: 107 mEq/L (ref 96–112)
GFR calc Af Amer: >90 mL/min (ref >90)
Potassium: 3.9 mEq/L (ref 3.5–5.1)
Sodium: 139 mEq/L (ref 135–145)

## 2013-07-27 LAB — PREPARE RBC (CROSSMATCH)

## 2013-07-27 MED ORDER — ONDANSETRON HCL 4 MG/2ML IJ SOLN: 4.0000 mg | Freq: Four times a day (QID) | INTRAMUSCULAR | Status: DC | PRN

## 2013-07-27 MED ORDER — HYDROMORPHONE HCL PF 1 MG/ML IJ SOLN
2.0000 mg | INTRAMUSCULAR | Status: DC | PRN
  Administered 2013-07-27 – 2013-07-29 (×15): 2 mg via INTRAVENOUS
  Filled 2013-07-27 (×15): qty 2

## 2013-07-27 MED ORDER — HYDROMORPHONE HCL PF 1 MG/ML IJ SOLN
1.0000 mg | INTRAMUSCULAR | Status: DC | PRN
  Filled 2013-07-27: qty 1

## 2013-07-27 MED ORDER — SODIUM CHLORIDE 0.9 % IJ SOLN: 9.0000 mL | INTRAMUSCULAR | Status: DC | PRN

## 2013-07-27 MED ORDER — SODIUM CHLORIDE 0.9 % IV SOLN
100.0000 mg | Freq: Every day | INTRAVENOUS | Status: DC
  Administered 2013-07-27 – 2013-08-02 (×7): 100 mg via INTRAVENOUS
  Filled 2013-07-27 (×8): qty 100

## 2013-07-27 MED ORDER — HYDROMORPHONE HCL PF 1 MG/ML IJ SOLN: 1.0000 mg | INTRAMUSCULAR | Status: DC | PRN

## 2013-07-27 MED ORDER — NALOXONE HCL 0.4 MG/ML IJ SOLN: 0.4000 mg | INTRAMUSCULAR | Status: DC | PRN

## 2013-07-27 MED ORDER — HYDROMORPHONE HCL PF 1 MG/ML IJ SOLN
1.0000 mg | INTRAMUSCULAR | Status: DC | PRN
  Administered 2013-07-27: 1 mg via INTRAVENOUS

## 2013-07-27 MED ORDER — HYDROMORPHONE 0.3 MG/ML IV SOLN
INTRAVENOUS | Status: DC
  Administered 2013-07-27: 11:00:00 via INTRAVENOUS
  Administered 2013-07-27: 0.3 mg via INTRAVENOUS
  Administered 2013-07-27: 9.57 mg via INTRAVENOUS
  Filled 2013-07-27 (×2): qty 25

## 2013-07-27 NOTE — Consult Note (Signed)
CHAMP: [facility] antimicrobial management program  Re: fungemia  Ms. Solorzano is a 32yo F with sickle cell, admitted for acute chest and fevers. Blood cultures from port a cath are showing yeast on 11/2, peripheral cultures are NGTD on day 1. Dr. [name] has started micafungin and planning to have port removed shortly in the next 1-2 days. He will repeat blood culture to document clearance before picc line place. If blood cx still positive for fungemia, will likely need to do further studies to identify source of ongoing fungemia. Most likely source for now is her port a cath.  Please feel free to contact ID consultant on call for further questions.  Duke Salvia Drue Second MD MPH [facility] [phone]

## 2013-07-27 NOTE — Progress Notes (Signed)
07/27/13 1143 Type and cross pending per blood bank, labs sent to [facility] due to patient having six antibodies. Stated may take until tomorrow morning for blood to be ready. Notified Dr. [name], stated okay. Earnstine Regal, RN

## 2013-07-27 NOTE — Progress Notes (Signed)
07/27/13 1240 Blood cultures drawn yesterday growing yeast per lab. Text-paged Toya Smothers, NP to notify. Earnstine Regal, RN

## 2013-07-27 NOTE — Progress Notes (Signed)
Surgery  Asked to see this 32 yr. Old African American with sickle cell dis. Who has + blood cultures for fungus.  Have been asked to remove porta cath tomorrow she is currently being treated with anti fungals through porta cath.  Pt examined and chart reviewed and consent obtained. Filed Vitals:   07/27/13 1531  BP:   Pulse:   Temp:   Resp: 22  Pt is stable temp  99.4  O2 sat 100% on 2L/min, hr 113, BP 114/77.

## 2013-07-27 NOTE — Progress Notes (Signed)
[facility] PROGRESS NOTE  Dawn [name] ZOX:[id number] DOB: 1981/04/16 DOA: 07/25/2013 PCP: [name],[name], MD  Assessment/Plan: 1. Sickle cell anemia with acute pain crisis, hemolysis: Continue analgesics, fluids for hydration. Await 1 unit PRBC's for HG 6.3. Continue Toradol for adjunct therapy.  2. Fever: Etiology unclear. Urinalysis was equivocal, cultures yield no growth.  Chest x-ray was negative. She remains non hypoxic. Her sternal pain is consistent with her at usual crisis pain. No indication of acute chest syndrome at this point. Will repeat chest xray for thoroughness. Rocephin day #3 3. SSA : Continue Exjade, Foley catheter, hydroxyurea  Code Status: full Family Communication: none present Disposition Plan: home when ready   Consultants:  none  Procedures:  none  Antibiotics:  Rocephin 07/25/13>>>  Azithromax 07/26/13>>>  HPI/Subjective: Lying in bed. Reports continued pain. Denies dysuria.   Objective: Filed Vitals:   07/27/13 0927  BP: 117/65  Pulse: 113  Temp: 102.1 F (38.9 C)  Resp: 20    Intake/Output Summary (Last 24 hours) at 07/27/13 1050 Last data filed at 07/27/13 0924  Gross per 24 hour  Intake    710 ml  Output      2 ml  Net    708 ml   Filed Weights   07/25/13 1912 07/26/13 0500 07/27/13 0459  Weight: 69.854 kg (154 lb) 70.625 kg (155 lb 11.2 oz) 70.432 kg (155 lb 4.4 oz)    Exam:   General:  Well nourished. Appears uncomfortable  Cardiovascular: tachycardic but regular no MGR No LE edema  Respiratory: normal effort BS distant but clear, no wheeze no rhonchi  Abdomen: soft +BS non-tender to palpation  Musculoskeletal: no clubbing no cyanosis   Data Reviewed: Basic Metabolic Panel:  Recent Labs Lab 07/23/13 0404 07/25/13 1622 07/26/13 0548 07/27/13 0444  NA 136 134* 133* 139  K 3.4* 3.6 3.7 3.9  CL 102 100 101 107  CO2 21 25 27 23   GLUCOSE 163* 88 106* 111*  BUN 6 6 9 7   CREATININE 0.53 0.57 0.62 0.53   CALCIUM 8.7 8.6 8.3* 8.0*   Liver Function Tests:  Recent Labs Lab 07/25/13 1622  AST 87*  ALT 23  ALKPHOS 137*  BILITOT 10.2*  PROT 7.6  ALBUMIN 3.1*   No results found for this basename: LIPASE, AMYLASE,  in the last 168 hours No results found for this basename: AMMONIA,  in the last 168 hours CBC:  Recent Labs Lab 07/23/13 0404 07/25/13 1622 07/26/13 0548 07/27/13 0444  WBC 12.6* 10.6* 8.5 10.1  NEUTROABS  --  7.1  --   --   HGB 7.5* 8.4* 6.7* 6.3*  HCT 20.4* 22.6* 18.7* 17.1*  MCV 100.5* 101.8* 102.2* 101.2*  PLT 172 251 159 189   Cardiac Enzymes: No results found for this basename: CKTOTAL, CKMB, CKMBINDEX, TROPONINI,  in the last 168 hours BNP (last 3 results) No results found for this basename: PROBNP,  in the last 8760 hours CBG: No results found for this basename: GLUCAP,  in the last 168 hours  Recent Results (from the past 240 hour(s))  URINE CULTURE     Status: None   Collection Time    07/25/13  4:34 PM      Result Value Range Status   Specimen Description URINE, CLEAN CATCH   Final   Special Requests NONE   Final   Culture  Setup Time     Final   Value: 07/25/2013 23:57     Performed at [facility]   Colony Count     Final   Value: NO GROWTH     Performed at [facility]     Final   Value: NO GROWTH     Performed at [facility]   Report Status 07/27/2013 FINAL   Final  CULTURE, BLOOD (ROUTINE X 2)     Status: None   Collection Time    07/26/13  6:41 PM      Result Value Range Status   Specimen Description PORTA CATH   Final   Special Requests BOTTLES DRAWN AEROBIC AND ANAEROBIC 8CC EACH   Final   Culture NO GROWTH 1 DAY   Final   Report Status PENDING   Incomplete  CULTURE, BLOOD (ROUTINE X 2)     Status: None   Collection Time    07/26/13  6:46 PM      Result Value Range Status   Specimen Description BLOOD RIGHT HAND   Final   Special Requests BOTTLES DRAWN AEROBIC AND ANAEROBIC 6CC EACH   Final    Culture NO GROWTH 1 DAY   Final   Report Status PENDING   Incomplete     Studies: Dg Chest 2 View  07/25/2013   CLINICAL DATA:  Low grade fever, chest pain, back pain for 3 days, history sickle cell anemia  EXAM: CHEST  2 VIEW  COMPARISON:  05/13/2013  FINDINGS: Left subclavian Port-A-Cath stable tip projecting over SVC.  Upper normal heart size.  Mediastinal contours and pulmonary vascularity normal.  Minimal chronic peribronchial thickening.  No acute infiltrate, pleural effusion or pneumothorax.  Stable osseous findings.  IMPRESSION: Chronic changes.  No acute abnormalities.   Electronically Signed   By: Ulyses Southward M.D.   On: 07/25/2013 17:14    Scheduled Meds: . cefTRIAXone (ROCEPHIN)  IV  1 g Intravenous Q24H  . deferasirox  1,500 mg Oral QAC breakfast  . docusate sodium  100 mg Oral BID  . enoxaparin (LOVENOX) injection  40 mg Subcutaneous Q24H  . folic acid  1 mg Oral Daily  . HYDROmorphone PCA 0.3 mg/mL   Intravenous Q4H  . hydroxyurea  1,000 mg Oral QODAY   And  . hydroxyurea  500 mg Oral QODAY  . loratadine  10 mg Oral Daily  . pantoprazole (PROTONIX) IV  40 mg Intravenous Q24H   Continuous Infusions: . dextrose 5 % and 0.45 % NaCl with KCl 20 mEq/L 100 mL/hr at 07/27/13 0805    Active Problems:   Sickle cell anemia   Leukocytosis   HTN (hypertension)    Time spent: 30 minutes    [facility] M  [facility] Pager [phone]. If 7PM-7AM, please contact night-coverage at [email or web address], password [facility] 07/27/2013, 10:50 AM  LOS: 2 days

## 2013-07-27 NOTE — Progress Notes (Signed)
Pt's blood is ready. Pt's has a fever at this time and she would like to try to get some sleep before receiving blood. Previous nurse reports Dr.Goodrich knew pt may not be able to get blood til morning. Will continue to monitor.

## 2013-07-27 NOTE — Care Management Note (Addendum)
    Page 1 of 1   08/10/2013     11:17:44 AM   CARE MANAGEMENT NOTE 08/10/2013  Patient:  DawnDawn [name]   Account Number:  [id number]  Date Initiated:  07/26/2013  Documentation initiated by:  Lanier Clam  Subjective/Objective Assessment:   32 Y/O F ADMITTED W/SICKLE CELL CRISIS.     Action/Plan:   FROM HOME.   Anticipated DC Date:  07/28/2013   Anticipated DC Plan:  HOME/SELF CARE      DC Planning Services  CM consult      Choice offered to / List presented to:             Status of service:  Completed, signed off Medicare Important Message given?  YES (If response is "NO", the following Medicare IM given date fields will be blank) Date Medicare IM given:  08/07/2013 Date Additional Medicare IM given:  08/10/2013  Discharge Disposition:  HOME/SELF CARE  Per UR Regulation:  Reviewed for med. necessity/level of care/duration of stay  If discussed at Long Length of Stay Meetings, dates discussed:   07/30/2013  08/04/2013  08/06/2013    Comments:  08/10/13 1115 Arlyss Queen, RN BSN CM Pt discharged home today. No CM needs noted.  08/06/13 1126 Arlyss Queen, RN BSN CM pt weaning from PCA today. Will continue to follow for discharge planning needs.  07/27/13 1115 Arlyss Queen, RN BSN CM Pt from home and independent with ADL's. No CM needs noted.

## 2013-07-27 NOTE — Progress Notes (Addendum)
Patient seen, independently examined and chart reviewed. I agree with exam, assessment and plan discussed with Dawn Smothers, NP.  Uneventful night. Her breathing is better today. She continues to have her usual pain in her chest, back and legs. The pain is not very well controlled with current bolus regimen. She continues to have intermittent fever of unclear etiology. Her initial evaluation and current clinical status do not suggest acute chest syndrome. This may be a viral syndrome. For now continue antibiotics and followup blood cultures.  Care discussed with patient's primary care physician Dr. [name]--recommendations:  Transfuse 1 unit PRBC  Discontinue bolus dosing. Start PCA custom dosing.  Addendum 1630: Blood culture 1/2 positive for yeast. Positive blood cultures from port. Discussed with Drs. Campbell and Snider--recommend micafungin, port removal, clearance of fungemia, then PICC. Discussed with patient, informed culture and recommendations. She wants to proceed. Discussed with Dr. Malvin Johns. Tentative plan for port removal 11/4.  [name], MD [facility] [phone]

## 2013-07-27 NOTE — Progress Notes (Signed)
07/27/13 1819 Notified Dr. [name] of blood cultures drawn earlier this afternoon per lab. Stated no need for blood cultures repeated. Also notified unable to maintain peripheral IV access for mycamine as ordered. Not compatible with dilaudid PCA per pharmacy. IV access obtained but not working well, flushes well but does not flow. Stated will d/c PCA and order dilaudid PRN for pain. Mycamine to be given via port-a-cath once PCA d/c'd. Night shift nursing staff to attempt peripheral IV access if possible per MD request. Earnstine Regal, RN

## 2013-07-28 DIAGNOSIS — B49 Unspecified mycosis: Secondary | ICD-10-CM | POA: Diagnosis not present

## 2013-07-28 LAB — BASIC METABOLIC PANEL
CO2: 27 mEq/L (ref 19–32)
Calcium: 8.6 mg/dL (ref 8.4–10.5)
Chloride: 102 mEq/L (ref 96–112)
GFR calc Af Amer: >90 mL/min (ref >90)
Potassium: 4.1 mEq/L (ref 3.5–5.1)
Sodium: 136 mEq/L (ref 135–145)

## 2013-07-28 LAB — CBC
MCH: 37.3 pg — ABNORMAL HIGH (ref 26.0–34.0)
MCV: 101.8 fL — ABNORMAL HIGH (ref 78.0–100.0)
Platelets: 214 10*3/uL (ref 150–400)
RBC: 1.69 MIL/uL — ABNORMAL LOW (ref 3.87–5.11)
RDW: 23.8 % — ABNORMAL HIGH (ref 11.5–15.5)
WBC: 14.5 10*3/uL — ABNORMAL HIGH (ref 4.0–10.5)

## 2013-07-28 LAB — SURGICAL PCR SCREEN
MRSA, PCR: NEGATIVE
Staphylococcus aureus: NEGATIVE

## 2013-07-28 NOTE — Progress Notes (Signed)
[facility] PROGRESS NOTE  Lolly Glaus Boardley BJY:[id number] DOB: [birth year] DOA: 07/25/2013 PCP: [name],[name], MD  Assessment/Plan: 1. Sickle cell anemia with acute pain crisis, hemolysis: Continue analgesics, fluids for hydration. Getting 1 unit PRBC's for HG 6.3. Continue Toradol for adjunct therapy. Cbc in am 2. Fever: Likely related to fungemia. Continue micafungin day #2. Urinalysis was equivocal, cultures yield no growth.  Repeat chest x-ray was negative. She remains non hypoxic. Her sternal pain is consistent with her at usual crisis pain. No indication of acute chest syndrome at this point.  3. SSA : Continue Exjade, Foley catheter, hydroxyurea 4. Fungemia: likely from porta cath. ID recommending micafungin, removal of port and repeat blood cultures in 2 days. Appreciate surgical consult for removal of port scheduled 07/29/13. Currently has peripheral line.    Code Status: full Family Communication: none available Disposition Plan: home when ready   Consultants:  ID  Procedures:  none  Antibiotics:  Rocephin 07/25/13-07/27/13  Azithromycin 07/26/13 -07/27/13  Micafungin 07/28/13>>>  HPI/Subjective: Reports continued pain. Verbalizes stress/anxiety regarding fungemia.  Objective: Filed Vitals:   07/28/13 1209  BP: 130/75  Pulse: 116  Temp: 102.3 F (39.1 C)  Resp: 16    Intake/Output Summary (Last 24 hours) at 07/28/13 1227 Last data filed at 07/28/13 0936  Gross per 24 hour  Intake  992.5 ml  Output      0 ml  Net  992.5 ml   Filed Weights   07/26/13 0500 07/27/13 0459 07/28/13 0500  Weight: 70.625 kg (155 lb 11.2 oz) 70.432 kg (155 lb 4.4 oz) 69.627 kg (153 lb 8 oz)    Exam:   General:  Well nourished NAD  Cardiovascular: tachycardic regular No MGR No LE edema  Respiratory: normal effort BS clear bilaterally no wheeze  Abdomen: soft non-tender to palpation +BS  Musculoskeletal: no clubbing no cyanosis. Chest tender to touch   Data  Reviewed: Basic Metabolic Panel:  Recent Labs Lab 07/23/13 0404 07/25/13 1622 07/26/13 0548 07/27/13 0444 07/28/13 0819  NA 136 134* 133* 139 136  K 3.4* 3.6 3.7 3.9 4.1  CL 102 100 101 107 102  CO2 21 25 27 23 27   GLUCOSE 163* 88 106* 111* 106*  BUN 6 6 9 7  4*  CREATININE 0.53 0.57 0.62 0.53 0.54  CALCIUM 8.7 8.6 8.3* 8.0* 8.6   Liver Function Tests:  Recent Labs Lab 07/25/13 1622  AST 87*  ALT 23  ALKPHOS 137*  BILITOT 10.2*  PROT 7.6  ALBUMIN 3.1*   No results found for this basename: LIPASE, AMYLASE,  in the last 168 hours No results found for this basename: AMMONIA,  in the last 168 hours CBC:  Recent Labs Lab 07/23/13 0404 07/25/13 1622 07/26/13 0548 07/27/13 0444 07/28/13 0819  WBC 12.6* 10.6* 8.5 10.1 14.5*  NEUTROABS  --  7.1  --   --   --   HGB 7.5* 8.4* 6.7* 6.3* 6.3*  HCT 20.4* 22.6* 18.7* 17.1* 17.2*  MCV 100.5* 101.8* 102.2* 101.2* 101.8*  PLT 172 251 159 189 214   Cardiac Enzymes: No results found for this basename: CKTOTAL, CKMB, CKMBINDEX, TROPONINI,  in the last 168 hours BNP (last 3 results) No results found for this basename: PROBNP,  in the last 8760 hours CBG: No results found for this basename: GLUCAP,  in the last 168 hours  Recent Results (from the past 240 hour(s))  URINE CULTURE     Status: None   Collection Time    07/25/13  4:34 PM      Result Value Range Status   Specimen Description URINE, CLEAN CATCH   Final   Special Requests NONE   Final   Culture  Setup Time     Final   Value: 07/25/2013 23:57     Performed at [facility]   Colony Count     Final   Value: NO GROWTH     Performed at [facility]   Culture     Final   Value: NO GROWTH     Performed at [facility]   Report Status 07/27/2013 FINAL   Final  CULTURE, BLOOD (ROUTINE X 2)     Status: None   Collection Time    07/26/13  6:41 PM      Result Value Range Status   Specimen Description BLOOD LEFT PORTA CATH DRAWN BY RN LM    Final   Special Requests BOTTLES DRAWN AEROBIC AND ANAEROBIC [facility]   Final   Culture  Setup Time     Final   Value: 07/28/2013 01:13     Performed at [facility]   Culture     Final   Value: YEAST     Note: Gram Stain Report Called to,Read Back By and Verified With: ROGERS A AT 1234 ON 07/27/13 BY GAUGHAM M.     Performed at [facility]   Report Status PENDING   Incomplete  CULTURE, BLOOD (ROUTINE X 2)     Status: None   Collection Time    07/26/13  6:46 PM      Result Value Range Status   Specimen Description BLOOD RIGHT HAND   Final   Special Requests BOTTLES DRAWN AEROBIC AND ANAEROBIC 6CC EACH   Final   Culture NO GROWTH 2 DAYS   Final   Report Status PENDING   Incomplete  CULTURE, BLOOD (ROUTINE X 2)     Status: None   Collection Time    07/27/13  7:15 PM      Result Value Range Status   Specimen Description BLOOD RIGHT HAND   Final   Special Requests BOTTLES DRAWN AEROBIC ONLY 6CC   Final   Culture NO GROWTH 1 DAY   Final   Report Status PENDING   Incomplete  CULTURE, BLOOD (ROUTINE X 2)     Status: None   Collection Time    07/27/13  7:20 PM      Result Value Range Status   Specimen Description BLOOD RIGHT ANTECUBITAL   Final   Special Requests BOTTLES DRAWN AEROBIC AND ANAEROBIC 12CC EACH   Final   Culture NO GROWTH 1 DAY   Final   Report Status PENDING   Incomplete     Studies: Dg Chest 2 View  07/27/2013   CLINICAL DATA:  Fever, sickle cell crisis  EXAM: CHEST  2 VIEW  COMPARISON:  07/25/2013  FINDINGS: Left subclavian Port-A-Cath stable, tip projecting over SVC.  Enlargement of cardiac silhouette with slight pulmonary vascular congestion.  Left basilar atelectasis.  No definite acute infiltrate, pleural effusion, or pneumothorax.  No acute osseous findings.  IMPRESSION: Left basilar atelectasis.   Electronically Signed   By: Ulyses Southward M.D.   On: 07/27/2013 14:40    Scheduled Meds: . deferasirox  1,500 mg Oral QAC breakfast  . docusate  sodium  100 mg Oral BID  . folic acid  1 mg Oral Daily  . hydroxyurea  1,000 mg Oral QODAY   And  .  hydroxyurea  500 mg Oral QODAY  . loratadine  10 mg Oral Daily  . micafungin [facility]) IV  100 mg Intravenous Daily  . pantoprazole (PROTONIX) IV  40 mg Intravenous Q24H   Continuous Infusions: . dextrose 5 % and 0.45 % NaCl with KCl 20 mEq/L 75 mL/hr at 07/27/13 2105    Active Problems:   Sickle cell anemia   Leukocytosis   HTN (hypertension)   Fever, unspecified   Fungemia    Time spent: 30 minutes    [facility] M  [facility] Pager [phone]. If 7PM-7AM, please contact night-coverage at [email or web address], password [facility] 07/28/2013, 12:27 PM  LOS: 3 days

## 2013-07-28 NOTE — Progress Notes (Signed)
Patient seen, independently examined and chart reviewed. I agree with exam, assessment and plan discussed with Toya Smothers, NP.  She remains intermittently febrile. She was able to get a unit of blood this morning. Overall her pain is fairly well-controlled. No new issues.  She appears calm and relatively comfortable on examination. Heart and lung exam is unchanged.  Hemoglobin stable at 6.3. Basic metabolic panel unremarkable. Mild leukocytosis may be related to sickle cell crisis or infection. Blood cultures are still pending with no new data.  Will continue negative for sickle cell anemia with pain crisis. Continue fluids, pain medication. Currently on bolus dosing because of lack of peripheral access. We will check a CBC in the morning after transfusion.  In regard to her fungemia we will continue micafungin. Discussed case again with Dr. Drue Second. Peripheral access is very difficult, it does not seem likely that it will be able to be maintained for him to followup treatment prior to placement of PICC line. Given these limitations, ok to place PICC line tomorrow with removal of Port-A-Cath. Discussed also with Dr. Malvin Johns. Discussed above with patient at bedside.  [name], MD [facility] [phone]

## 2013-07-28 NOTE — Progress Notes (Signed)
Discussed options for IV access.   Will remove porta cath and coordinate picc line insertion with med service.  Everyone is aware that reinsertion of porta cath will require IR's help.  Pt very anemic and currently receiving transfusion.  Porta cath removal scheduled for tomorrow if all in agreement.

## 2013-07-28 NOTE — Progress Notes (Signed)
Discussed with Dr. [name] on Dr. Daisy Blossom behalf the possible need for PICC placement. Dr. [name] plans to treat with antifungal IV meds through peripheral access for at least 48 hours with hopes that no PICC will be needed in treatment.

## 2013-07-29 ENCOUNTER — Inpatient Hospital Stay (HOSPITAL_COMMUNITY): Payer: Medicare Other | Admitting: Anesthesiology

## 2013-07-29 ENCOUNTER — Encounter (HOSPITAL_COMMUNITY): Payer: Medicare Other | Admitting: Anesthesiology

## 2013-07-29 ENCOUNTER — Encounter (HOSPITAL_COMMUNITY)
Admission: EM | Disposition: A | Discharge status: Home / Self Care | Payer: Self-pay | Source: Home / Self Care | Attending: Internal Medicine

## 2013-07-29 ENCOUNTER — Encounter (HOSPITAL_COMMUNITY): Payer: Self-pay | Admitting: *Deleted

## 2013-07-29 HISTORY — PX: PORT-A-CATH REMOVAL: SHX5289

## 2013-07-29 LAB — BASIC METABOLIC PANEL
CO2: 26 mEq/L (ref 19–32)
Chloride: 102 mEq/L (ref 96–112)
Glucose, Bld: 90 mg/dL (ref 70–99)
Potassium: 4 mEq/L (ref 3.5–5.1)
Sodium: 136 mEq/L (ref 135–145)

## 2013-07-29 LAB — CBC
Hemoglobin: 7.8 g/dL — ABNORMAL LOW (ref 12.0–15.0)
Platelets: 224 10*3/uL (ref 150–400)
RBC: 2.27 MIL/uL — ABNORMAL LOW (ref 3.87–5.11)
WBC: 10.8 10*3/uL — ABNORMAL HIGH (ref 4.0–10.5)

## 2013-07-29 LAB — TYPE AND SCREEN
ABO/RH(D): A POS
Antibody Screen: POSITIVE
DAT, IgG: NEGATIVE
Unit division: 0

## 2013-07-29 SURGERY — REMOVAL PORT-A-CATH
Anesthesia: Monitor Anesthesia Care | Site: Chest | Laterality: Left | Wound class: Clean

## 2013-07-29 MED ORDER — BACITRACIN-NEOMYCIN-POLYMYXIN 400-5-5000 EX OINT
TOPICAL_OINTMENT | CUTANEOUS | Status: AC
  Filled 2013-07-29: qty 1

## 2013-07-29 MED ORDER — LIDOCAINE HCL (CARDIAC) 10 MG/ML IV SOLN
INTRAVENOUS | Status: DC | PRN
  Administered 2013-07-29: 50 mg via INTRAVENOUS

## 2013-07-29 MED ORDER — MIDAZOLAM HCL 2 MG/2ML IJ SOLN
1.0000 mg | INTRAMUSCULAR | Status: DC | PRN
  Administered 2013-07-29 (×2): 2 mg via INTRAVENOUS

## 2013-07-29 MED ORDER — LIDOCAINE HCL (PF) 1 % IJ SOLN
INTRAMUSCULAR | Status: AC
  Filled 2013-07-29: qty 30

## 2013-07-29 MED ORDER — ONDANSETRON HCL 4 MG/2ML IJ SOLN: 4.0000 mg | Freq: Once | INTRAMUSCULAR | Status: DC | PRN

## 2013-07-29 MED ORDER — FENTANYL CITRATE 0.05 MG/ML IJ SOLN
INTRAMUSCULAR | Status: AC
  Filled 2013-07-29: qty 2

## 2013-07-29 MED ORDER — PROPOFOL INFUSION 10 MG/ML OPTIME
INTRAVENOUS | Status: DC | PRN
  Administered 2013-07-29: 75 ug/kg/min via INTRAVENOUS

## 2013-07-29 MED ORDER — WATER FOR IRRIGATION, STERILE IR SOLN
Status: DC | PRN
  Administered 2013-07-29: 1000 mL

## 2013-07-29 MED ORDER — SODIUM CHLORIDE 0.9 % IV SOLN
INTRAVENOUS | Status: DC | PRN
  Administered 2013-07-29 (×2): via INTRAVENOUS

## 2013-07-29 MED ORDER — FENTANYL CITRATE 0.05 MG/ML IJ SOLN: 25.0000 ug | INTRAMUSCULAR | Status: DC | PRN

## 2013-07-29 MED ORDER — MIDAZOLAM HCL 2 MG/2ML IJ SOLN
INTRAMUSCULAR | Status: AC
  Filled 2013-07-29: qty 2

## 2013-07-29 MED ORDER — LIDOCAINE HCL (PF) 1 % IJ SOLN
INTRAMUSCULAR | Status: DC | PRN
  Administered 2013-07-29: 10 mL

## 2013-07-29 MED ORDER — SODIUM CHLORIDE 0.9 % IV SOLN
INTRAVENOUS | Status: DC
  Administered 2013-07-29: 1000 mL via INTRAVENOUS

## 2013-07-29 MED ORDER — HYDROMORPHONE HCL PF 1 MG/ML IJ SOLN
2.0000 mg | INTRAMUSCULAR | Status: DC | PRN
  Administered 2013-07-29 – 2013-07-31 (×25): 2 mg via INTRAVENOUS
  Filled 2013-07-29 (×26): qty 2

## 2013-07-29 MED ORDER — FENTANYL CITRATE 0.05 MG/ML IJ SOLN
25.0000 ug | INTRAMUSCULAR | Status: AC
  Administered 2013-07-29 (×2): 25 ug via INTRAVENOUS

## 2013-07-29 MED ORDER — SODIUM CHLORIDE 0.9 % IR SOLN
Status: DC | PRN
  Administered 2013-07-29: 1000 mL

## 2013-07-29 MED ORDER — LACTATED RINGERS IV SOLN: INTRAVENOUS | Status: DC

## 2013-07-29 MED ORDER — MIDAZOLAM HCL 5 MG/5ML IJ SOLN
INTRAMUSCULAR | Status: DC | PRN
  Administered 2013-07-29: 2 mg via INTRAVENOUS

## 2013-07-29 MED ORDER — POVIDONE-IODINE 10 % EX OINT
TOPICAL_OINTMENT | CUTANEOUS | Status: AC
  Filled 2013-07-29: qty 1

## 2013-07-29 MED ORDER — FENTANYL CITRATE 0.05 MG/ML IJ SOLN
INTRAMUSCULAR | Status: DC | PRN
  Administered 2013-07-29 (×3): 25 ug via INTRAVENOUS

## 2013-07-29 SURGICAL SUPPLY — 44 items
BAG HAMPER (MISCELLANEOUS) ×2 IMPLANT
BLADE HEX COATED 2.75 (ELECTRODE) ×2 IMPLANT
BLADE SURG 15 STRL LF DISP TIS (BLADE) ×2 IMPLANT
BLADE SURG 15 STRL SS (BLADE) ×2
CLOTH BEACON ORANGE TIMEOUT ST (SAFETY) ×2 IMPLANT
COVER LIGHT HANDLE STERIS (MISCELLANEOUS) ×4 IMPLANT
DRAPE LAPAROTOMY TRNSV 102X78 (DRAPE) ×2 IMPLANT
DRSG TEGADERM 2-3/8X2-3/4 SM (GAUZE/BANDAGES/DRESSINGS) ×2 IMPLANT
DURAPREP 26ML APPLICATOR (WOUND CARE) ×2 IMPLANT
ELECT REM PT RETURN 9FT ADLT (ELECTROSURGICAL) ×2
ELECTRODE REM PT RTRN 9FT ADLT (ELECTROSURGICAL) ×1 IMPLANT
GAUZE SPONGE 4X4 16PLY XRAY LF (GAUZE/BANDAGES/DRESSINGS) ×2 IMPLANT
GLOVE INDICATOR 7.0 STRL GRN (GLOVE) ×2 IMPLANT
GLOVE SKINSENSE NS SZ6.5 (GLOVE) ×1
GLOVE SKINSENSE NS SZ7.0 (GLOVE) ×1
GLOVE SKINSENSE STRL SZ6.5 (GLOVE) ×1 IMPLANT
GLOVE SKINSENSE STRL SZ7.0 (GLOVE) ×1 IMPLANT
GOWN STRL REIN XL XLG (GOWN DISPOSABLE) ×4 IMPLANT
KIT BLADEGUARD II DBL (SET/KITS/TRAYS/PACK) ×2 IMPLANT
KIT ROOM TURNOVER APOR (KITS) ×2 IMPLANT
MANIFOLD NEPTUNE II (INSTRUMENTS) ×2 IMPLANT
MARKER SKIN DUAL TIP RULER LAB (MISCELLANEOUS) ×2 IMPLANT
NEEDLE HYPO 25X1 1.5 SAFETY (NEEDLE) ×2 IMPLANT
NS IRRIG 1000ML POUR BTL (IV SOLUTION) ×2 IMPLANT
PACK BASIC III (CUSTOM PROCEDURE TRAY) ×1
PACK SRG BSC III STRL LF ECLPS (CUSTOM PROCEDURE TRAY) ×1 IMPLANT
PAD ARMBOARD 7.5X6 YLW CONV (MISCELLANEOUS) ×2 IMPLANT
PENCIL HANDSWITCHING (ELECTRODE) ×2 IMPLANT
SET BASIN LINEN APH (SET/KITS/TRAYS/PACK) ×2 IMPLANT
SOL PREP PROV IODINE SCRUB 4OZ (MISCELLANEOUS) ×2 IMPLANT
SPONGE GAUZE 2X2 8PLY STRL LF (GAUZE/BANDAGES/DRESSINGS) ×2 IMPLANT
STRIP CLOSURE SKIN 1/4X3 (GAUZE/BANDAGES/DRESSINGS) ×2 IMPLANT
SUT ETHILON 4 0 PS 2 18 (SUTURE) ×2 IMPLANT
SUT VIC AB 3-0 SH 27 (SUTURE) ×2
SUT VIC AB 3-0 SH 27X BRD (SUTURE) ×2 IMPLANT
SUT VIC AB 4-0 PS2 27 (SUTURE) ×2 IMPLANT
SUT VIC AB 5-0 P-3 18X BRD (SUTURE) ×1 IMPLANT
SUT VIC AB 5-0 P3 18 (SUTURE) ×1
SUT VICRYL AB 3 0 TIES (SUTURE) IMPLANT
SYR BULB IRRIGATION 50ML (SYRINGE) ×2 IMPLANT
SYR CONTROL 10ML LL (SYRINGE) ×2 IMPLANT
TUBE ANAEROBIC PORT A CUL  W/M (MISCELLANEOUS) ×2 IMPLANT
WATER STERILE IRR 1000ML POUR (IV SOLUTION) ×4 IMPLANT
YANKAUER SUCT 12FT TUBE ARGYLE (SUCTIONS) ×2 IMPLANT

## 2013-07-29 NOTE — Anesthesia Procedure Notes (Signed)
Procedure Name: MAC Date/Time: 07/29/2013 9:41 AM Performed by: Carolyne Littles, AMY L Pre-anesthesia Checklist: Timeout performed, Emergency Drugs available, Patient identified, Suction available and Patient being monitored Oxygen Delivery Method: Non-rebreather mask

## 2013-07-29 NOTE — Brief Op Note (Signed)
07/25/2013 - 07/29/2013  10:27 AM  PATIENT:  Dawn [name]  32 y.o. female  PRE-OPERATIVE DIAGNOSIS:  Positive blood cultures for fungus  POST-OPERATIVE DIAGNOSIS:  Positive blood cultures for fungus  PROCEDURE:  Procedure(s): REMOVAL PORT-A-CATH (Left)  SURGEON:  Surgeon(s) and Role:    * Marlane Hatcher, MD - Primary  PHYSICIAN ASSISTANT:   ASSISTANTS: none   ANESTHESIA:   IV sedation  EBL:  Total I/O In: 1500 [I.V.:1500] Out: 5 [Blood:5]  BLOOD ADMINISTERED:none  DRAINS: none   LOCAL MEDICATIONS USED:  XYLOCAINE  1 % without epi  ~ 10 cc.  SPECIMEN:  Source of Specimen:  porta cath tip sent in culture medium.  DISPOSITION OF SPECIMEN:  PATHOLOGY  COUNTS:  YES  TOURNIQUET:  * No tourniquets in log *  DICTATION: .Other Dictation: Dictation Number OR dict. # [id number].  PLAN OF CARE: Admit to inpatient   PATIENT DISPOSITION:  PACU - hemodynamically stable.   Delay start of Pharmacological VTE agent (>24hrs) due to surgical blood loss or risk of bleeding: not applicable

## 2013-07-29 NOTE — Progress Notes (Signed)
[facility] PROGRESS NOTE  Dawn [name] EXB:[id number] DOB: Jul 14, 1981 DOA: 07/25/2013 PCP: [name],[name], MD  Assessment/Plan: 1. Sickle cell anemia with acute pain crisis, hemolysis: Fair pain control.  Continue analgesics, fluids for hydration. S/P 1 unit PRBC's for HG 6.3. Currently Hg 7.8. Continue Toradol for adjunct therapy. Cbc in am 2. Fever: Likely related to fungemia. Max temp 99.1 last 24 hours.  Continue micafungin day #3. Urinalysis was equivocal, Repeat chest x-ray was negative. She remains non hypoxic. Her sternal pain is consistent with her at usual crisis pain. No indication of acute chest syndrome at this point.  3. SSA : Continue Exjade, Foley catheter, hydroxyurea 4. Fungemia: likely from porta cath. ID recommending micafungin, removal of port and repeat blood cultures in 2 days. S/P removal of porta cath. Currently has peripheral line. But very difficult to maintain. ID indicates may put in PICC before repeat blood cultures on 07/31/13 if needed. Micafungin day #3.  5. Tachycardia: Mild. likely related to pain/ infection. Hemodynamically stable. Will continue IV hydration. Monitor.    Code Status: full Family Communication: at bedside Disposition Plan: home when ready.   Consultants:  Dr. Malvin Johns [facility]  Procedures:  Removal porta cath 07/29/13  Antibiotics: Rocephin 07/25/13-07/27/13  Azithromycin 07/26/13 -07/27/13  Micafungin 07/27/13>>>   HPI/Subjective: Continues with pain.  Objective: Filed Vitals:   07/29/13 1117  BP: 111/75  Pulse: 91  Temp: 98.7 F (37.1 C)  Resp: 18    Intake/Output Summary (Last 24 hours) at 07/29/13 1123 Last data filed at 07/29/13 1103  Gross per 24 hour  Intake 4265.83 ml  Output      5 ml  Net 4260.83 ml   Filed Weights   07/27/13 0459 07/28/13 0500 07/29/13 0807  Weight: 70.432 kg (155 lb 4.4 oz) 69.627 kg (153 lb 8 oz) 69.4 kg (153 lb)    Exam:   General:  Well nourished NAD  Cardiovascular:  tachycardia but regular no MGR No LE edema  Respiratory: normal effort BS clear bilaterally no wheeze  Abdomen: soft +BS throughout non-tender to palpation  Musculoskeletal: no clubbing no cyanosis   Data Reviewed: Basic Metabolic Panel:  Recent Labs Lab 07/25/13 1622 07/26/13 0548 07/27/13 0444 07/28/13 0819 07/29/13 0446  NA 134* 133* 139 136 136  K 3.6 3.7 3.9 4.1 4.0  CL 100 101 107 102 102  CO2 25 27 23 27 26   GLUCOSE 88 106* 111* 106* 90  BUN 6 9 7  4* 4*  CREATININE 0.57 0.62 0.53 0.54 0.55  CALCIUM 8.6 8.3* 8.0* 8.6 8.6   Liver Function Tests:  Recent Labs Lab 07/25/13 1622  AST 87*  ALT 23  ALKPHOS 137*  BILITOT 10.2*  PROT 7.6  ALBUMIN 3.1*   No results found for this basename: LIPASE, AMYLASE,  in the last 168 hours No results found for this basename: AMMONIA,  in the last 168 hours CBC:  Recent Labs Lab 07/25/13 1622 07/26/13 0548 07/27/13 0444 07/28/13 0819 07/29/13 0446  WBC 10.6* 8.5 10.1 14.5* 10.8*  NEUTROABS 7.1  --   --   --   --   HGB 8.4* 6.7* 6.3* 6.3* 7.8*  HCT 22.6* 18.7* 17.1* 17.2* 21.7*  MCV 101.8* 102.2* 101.2* 101.8* 95.6  PLT 251 159 189 214 224   Cardiac Enzymes: No results found for this basename: CKTOTAL, CKMB, CKMBINDEX, TROPONINI,  in the last 168 hours BNP (last 3 results) No results found for this basename: PROBNP,  in the last 8760 hours CBG:  No results found for this basename: GLUCAP,  in the last 168 hours  Recent Results (from the past 240 hour(s))  URINE CULTURE     Status: None   Collection Time    07/25/13  4:34 PM      Result Value Range Status   Specimen Description URINE, CLEAN CATCH   Final   Special Requests NONE   Final   Culture  Setup Time     Final   Value: 07/25/2013 23:57     Performed at [facility] Count     Final   Value: NO GROWTH     Performed at [facility]   Culture     Final   Value: NO GROWTH     Performed at [facility]   Report Status  07/27/2013 FINAL   Final  CULTURE, BLOOD (ROUTINE X 2)     Status: None   Collection Time    07/26/13  6:41 PM      Result Value Range Status   Specimen Description BLOOD LEFT PORTA CATH DRAWN BY RN LM   Final   Special Requests BOTTLES DRAWN AEROBIC AND ANAEROBIC [facility] EACH   Final   Culture  Setup Time     Final   Value: 07/28/2013 01:13     Performed at [facility]   Culture     Final   Value: YEAST     Note: Gram Stain Report Called to,Read Back By and Verified With: ROGERS A AT 1234 ON 07/27/13 BY GAUGHAM M.     Performed at [facility]   Report Status PENDING   Incomplete  CULTURE, BLOOD (ROUTINE X 2)     Status: None   Collection Time    07/26/13  6:46 PM      Result Value Range Status   Specimen Description BLOOD RIGHT HAND   Final   Special Requests BOTTLES DRAWN AEROBIC AND ANAEROBIC [facility] EACH   Final   Culture  Setup Time     Final   Value: 07/28/2013 19:03     Performed at [facility]   Culture     Final   Value: YEAST     Note: Gram Stain Report Called to,Read Back By and Verified With: ROGERSN A AT 1234 ON 07/27/2013 BY BAUGHAM     Performed at [facility]   Report Status PENDING   Incomplete  CULTURE, BLOOD (ROUTINE X 2)     Status: None   Collection Time    07/27/13  7:15 PM      Result Value Range Status   Specimen Description BLOOD RIGHT HAND   Final   Special Requests BOTTLES DRAWN AEROBIC ONLY 6CC   Final   Culture NO GROWTH 2 DAYS   Final   Report Status PENDING   Incomplete  CULTURE, BLOOD (ROUTINE X 2)     Status: None   Collection Time    07/27/13  7:20 PM      Result Value Range Status   Specimen Description BLOOD RIGHT ANTECUBITAL   Final   Special Requests BOTTLES DRAWN AEROBIC AND ANAEROBIC 12CC EACH   Final   Culture NO GROWTH 2 DAYS   Final   Report Status PENDING   Incomplete  SURGICAL PCR SCREEN     Status: None   Collection Time    07/28/13 11:16 AM      Result Value Range Status   MRSA, PCR NEGATIVE  NEGATIVE Final   Staphylococcus aureus NEGATIVE  NEGATIVE Final   Comment:            The Xpert SA Assay (FDA     approved for NASAL specimens     in patients over 40 years of age),     is one component of     a comprehensive surveillance     program.  Test performance has     been validated by [employer] for patients greater     than or equal to 40 year old.     It is not intended     to diagnose infection nor to     guide or monitor treatment.     Studies: Dg Chest 2 View  07/27/2013   CLINICAL DATA:  Fever, sickle cell crisis  EXAM: CHEST  2 VIEW  COMPARISON:  07/25/2013  FINDINGS: Left subclavian Port-A-Cath stable, tip projecting over SVC.  Enlargement of cardiac silhouette with slight pulmonary vascular congestion.  Left basilar atelectasis.  No definite acute infiltrate, pleural effusion, or pneumothorax.  No acute osseous findings.  IMPRESSION: Left basilar atelectasis.   Electronically Signed   By: Ulyses Southward M.D.   On: 07/27/2013 14:40    Scheduled Meds: . deferasirox  1,500 mg Oral QAC breakfast  . docusate sodium  100 mg Oral BID  . folic acid  1 mg Oral Daily  . hydroxyurea  1,000 mg Oral QODAY   And  . hydroxyurea  500 mg Oral QODAY  . loratadine  10 mg Oral Daily  . micafungin [facility]) IV  100 mg Intravenous Daily  . pantoprazole (PROTONIX) IV  40 mg Intravenous Q24H   Continuous Infusions: . dextrose 5 % and 0.45 % NaCl with KCl 20 mEq/L 50 mL/hr at 07/29/13 0508    Active Problems:   Sickle cell anemia   Leukocytosis   HTN (hypertension)   Fever, unspecified   Fungemia    Time spent: 87    [facility] M  [facility] Pager [phone]. If 7PM-7AM, please contact night-coverage at [email or web address], password [facility] 07/29/2013, 11:23 AM  LOS: 4 days    Attending note:  Patient seen and examined.  Agree with note as above per Toya Smothers, NP.  She reports pain is not adequately controlled with current pain regimen.  Pain meds do not last  long enough.  Increased frequency of dilaudid.  She is continued on micafungin for fungemia.  portacath has been removed.  MEMON,JEHANZEB

## 2013-07-29 NOTE — Preoperative (Signed)
Beta Blockers   Reason not to administer Beta Blockers:Not Applicable 

## 2013-07-29 NOTE — Progress Notes (Signed)
Pt will have porta cath removed as per ID and hospitalist.  She has functioning peripheral IV.from which fungemia will be treated.  H/H 7.8 /21.7 after transfusion.  Filed Vitals:   07/29/13 0910  BP:   Pulse:   Temp:   Resp: 20  temp. 99.1, pulse 100, BP 119/65. O2 sat 97% 2 L/min.

## 2013-07-29 NOTE — Progress Notes (Signed)
Pt will return to hospitalist service and to resume preop diet activity and meds

## 2013-07-29 NOTE — Transfer of Care (Signed)
Immediate Anesthesia Transfer of Care Note  Patient: Dawn [name]  Procedure(s) Performed: Procedure(s): REMOVAL PORT-A-CATH (Left)  Patient Location: PACU  Anesthesia Type:MAC  Level of Consciousness: awake, alert , oriented and patient cooperative  Airway & Oxygen Therapy: Patient Spontanous Breathing and Patient connected to nasal cannula oxygen  Post-op Assessment: Report given to PACU RN and Post -op Vital signs reviewed and stable  Post vital signs: Reviewed and stable  Complications: No apparent anesthesia complications

## 2013-07-29 NOTE — Anesthesia Preprocedure Evaluation (Signed)
Anesthesia Evaluation  Patient identified by MRN, date of birth, ID band Patient awake    Reviewed: Allergy & Precautions, H&P , NPO status , Patient's Chart, lab work & pertinent test results  Airway Mallampati: II TM Distance: >3 FB     Dental  (+) Teeth Intact   Pulmonary neg pulmonary ROS, pneumonia - (resolved 04-2013), resolved,  breath sounds clear to auscultation        Cardiovascular Rhythm:Regular Rate:Normal     Neuro/Psych PSYCHIATRIC DISORDERS Depression    GI/Hepatic   Endo/Other    Renal/GU      Musculoskeletal   Abdominal   Peds  Hematology  (+) Blood dyscrasia, Sickle cell anemia and anemia ,   Anesthesia Other Findings Transfused 1 PC last night. Systemic fungal infection.  Reproductive/Obstetrics                           Anesthesia Physical Anesthesia Plan  ASA: III  Anesthesia Plan: MAC   Post-op Pain Management:    Induction: Intravenous  Airway Management Planned: Nasal Cannula  Additional Equipment:   Intra-op Plan:   Post-operative Plan:   Informed Consent: I have reviewed the patients History and Physical, chart, labs and discussed the procedure including the risks, benefits and alternatives for the proposed anesthesia with the patient or authorized representative who has indicated his/her understanding and acceptance.     Plan Discussed with:   Anesthesia Plan Comments:         Anesthesia Quick Evaluation

## 2013-07-29 NOTE — Anesthesia Postprocedure Evaluation (Signed)
  Anesthesia Post-op Note  Patient: Dee L Medero  Procedure(s) Performed: Procedure(s): REMOVAL PORT-A-CATH (Left)  Patient Location: PACU  Anesthesia Type:MAC  Level of Consciousness: awake, alert , oriented and patient cooperative  Airway and Oxygen Therapy: Patient Spontanous Breathing and Patient connected to nasal cannula oxygen  Post-op Pain: mild  Post-op Assessment: Post-op Vital signs reviewed, Patient's Cardiovascular Status Stable, Respiratory Function Stable, Patent Airway, No signs of Nausea or vomiting and Pain level controlled  Post-op Vital Signs: Reviewed and stable  Complications: No apparent anesthesia complications

## 2013-07-30 LAB — CBC
HCT: 20.9 % — ABNORMAL LOW (ref 36.0–46.0)
Hemoglobin: 7.4 g/dL — ABNORMAL LOW (ref 12.0–15.0)
MCV: 95.4 fL (ref 78.0–100.0)
RBC: 2.19 MIL/uL — ABNORMAL LOW (ref 3.87–5.11)
WBC: 10.5 10*3/uL (ref 4.0–10.5)

## 2013-07-30 LAB — BASIC METABOLIC PANEL
BUN: 3 mg/dL — ABNORMAL LOW (ref 6–23)
CO2: 26 mEq/L (ref 19–32)
Chloride: 104 mEq/L (ref 96–112)
Creatinine, Ser: 0.51 mg/dL (ref 0.50–1.10)
Glucose, Bld: 104 mg/dL — ABNORMAL HIGH (ref 70–99)

## 2013-07-30 MED ORDER — PANTOPRAZOLE SODIUM 40 MG PO TBEC
40.0000 mg | DELAYED_RELEASE_TABLET | Freq: Every day | ORAL | Status: DC
  Administered 2013-07-31 – 2013-08-10 (×10): 40 mg via ORAL
  Filled 2013-07-30 (×12): qty 1

## 2013-07-30 NOTE — Op Note (Signed)
NAME:  Dawn [name], Dawn [name]                    ACCOUNT NO.:  [id number]  MEDICAL RECORD NO.:  [id number]  LOCATION:  A308                          FACILITY:  [facility]  PHYSICIAN:  Barbaraann Barthel, M.D. DATE OF BIRTH:  07/14/1981  DATE OF PROCEDURE:  07/29/2013 DATE OF DISCHARGE:                              OPERATIVE REPORT   DIAGNOSIS:  Sickle cell disease (fungemia).  PROCEDURE:  Removal of Port-A-Cath.  WOUND CLASSIFICATION:  Clean.  SPECIMEN:  Catheter tip sent in culture medium.  NOTE:  This is an unfortunate 32 year old African American who has had recurrent bouts of sickle cell crises and has had 3 Port-A-Cath placed in the past, and has recently been admitted to the hospital with fungemia and the hospitalist requested removal of the Port-A-Cath after consultations with the Infectious Disease Department.  The patient has a functional peripheral IV and was transfused prior to going to the operating room where she was treated under light IV sedation and local anesthesia.  GROSS OPERATIVE FINDINGS:  Minimal pseudocapsule around the Port-A-Cath device.  No obvious inflammation noted.  The tip of the Port-A-Cath was cut, so the silastic tip was sent as a specimen in a culture medium.  TECHNIQUE:  The patient was placed in supine position and after IV sedation, I used approximately 10 mL of 1% Xylocaine without epinephrine to anesthetize the area around the infusion device, and this was after prepping and draping, DuraPrep was used to prep the patient.  After infiltration of local anesthesia, an incision was carried out over the infusion device of the Port-A-Cath, this was easily removed and the pseudocapsule around the area where the silastic catheter was removed was clamped and this was ligated with 3-0 Polysorb.  I then irrigated with normal saline solution copiously and then controlled minimal amount of oozing with a cautery device, and I closed the wound with mattress sutures of 4-0  monofilament.  I chose not to place subcuticular stitches with the history of fungemia.  No drain was placed.  There were no complications.  Prior to closure, all sponge, needle, and instrument counts were found to be correct.  Estimated blood loss was minimal.  The patient received 400 mL of crystalloids intraoperatively.  The patient will be returned to the Hospitalist Service where arrangements will be made for further IV access after treatment of her fungemia.     Barbaraann Barthel, M.D.     WB/MEDQ  D:  07/29/2013  T:  07/30/2013  Job:  [id number]

## 2013-07-30 NOTE — Progress Notes (Signed)
Post OP Check  Wound very clean and redressed.  Simple care with alcohol swabs bid when she goes home and I will remove sutures in a week.  Family told to contact me. Pt will have new porta cath placed when appropriate by IR service.  Blood pressure 117/80, pulse 96, temperature 98.2 F (36.8 C), temperature source Oral, resp. rate 20, height 5\' 3"  (1.6 m), weight 153 lb (69.4 kg), SpO2 92.00%.

## 2013-07-30 NOTE — Anesthesia Postprocedure Evaluation (Signed)
  Anesthesia Post-op Note  Patient: Dawn [name]  Procedure(s) Performed: Procedure(s): REMOVAL PORT-A-CATH (Left)  Patient Location: Room 308  Anesthesia Type:MAC  Level of Consciousness: awake, alert , oriented and patient cooperative  Airway and Oxygen Therapy: Patient Spontanous Breathing and Patient connected to nasal cannula oxygen  Post-op Pain: moderate  Post-op Assessment: Post op VS reviewed; Respiratory status stable; cardiovascular status stable  Post-op Vital Signs: Reviewed and stable  Complications: No apparent anesthesia complications

## 2013-07-30 NOTE — Progress Notes (Signed)
Patient seen and examined. Agree with note as above per Toya Smothers,  NP.  Pain appears to be improving with current pain regimen. She's not had any further fevers. Continue micafungin. Her Port-A-Cath has been removed. Blood cultures will be repeated tomorrow to document clearance of fungemia. We'll need to discuss with ID regarding length of antifungal treatment as well as timing of placement of new Port-A-Cath.  MEMON,JEHANZEB

## 2013-07-30 NOTE — Progress Notes (Signed)
The patient is receiving Protonix by the intravenous route.  Based on criteria approved by the Pharmacy and Therapeutics Committee and the Medical Executive Committee, the medication is being converted to the equivalent oral dose form.  These criteria include: -No Active GI bleeding -Able to tolerate diet of full liquids (or better) or tube feeding OR able to tolerate other medications by the oral or enteral route  If you have any questions about this conversion, please contact the Pharmacy Department (ext [phone]).  Thank you.  Dawn [name], [facility] 07/30/2013 11:30 AM

## 2013-07-30 NOTE — Progress Notes (Signed)
[facility] PROGRESS NOTE  Dawn [name] OZH:[id number] DOB: Mar 14, 1981 DOA: 07/25/2013 PCP: [name],[name], MD  Assessment/Plan: 1. Sickle cell anemia with acute pain crisis, hemolysis: Improved pain control. Continue analgesics, fluids for hydration. S/P 1 unit PRBC's for HG 6.3. Currently Hg 7.4. Continue Toradol for adjunct therapy. Cbc in am 2. Fever: Likely related to fungemia. Afebrile last 24 hours. Continue micafungin day #4. Urinalysis was equivocal, Repeat chest x-ray was negative. She remains non toxic and non hypoxic. Her sternal pain is consistent with her at usual crisis pain. No indication of acute chest syndrome at this point.  3. SSA : Continue Exjade, Foley catheter, hydroxyurea 4. Fungemia: likely from porta cath. ID recommending micafungin, removal of port and repeat blood cultures in 2 days. S/P removal of porta cath. Currently has peripheral line. ID indicates may put in PICC before repeat blood cultures on 07/31/13 if needed. Micafungin day #4.  5. Tachycardia: Mild. likely related to pain/ infection. Hemodynamically stable. Will continue IV hydration.      Code Status: full Family Communication: none present Disposition Plan: home when ready   Consultants:  surgery  Procedures:  none  Antibiotics: Rocephin 07/25/13-07/27/13  Azithromycin 07/26/13 -07/27/13  Micafungin 07/27/13>>>     HPI/Subjective: Sitting on side of bed. Reports some improvement in pain control.   Objective: Filed Vitals:   07/30/13 1025  BP: 109/75  Pulse: 93  Temp: 98.8 F (37.1 C)  Resp: 20    Intake/Output Summary (Last 24 hours) at 07/30/13 1052 Last data filed at 07/30/13 0848  Gross per 24 hour  Intake    820 ml  Output      0 ml  Net    820 ml   Filed Weights   07/27/13 0459 07/28/13 0500 07/29/13 0807  Weight: 70.432 kg (155 lb 4.4 oz) 69.627 kg (153 lb 8 oz) 69.4 kg (153 lb)    Exam:   General:  Well nourished NAD  Cardiovascular: RRR No MGR No LE  edema PPP  Respiratory: normal effort slightly shallow good air movement. Faint fine crackles right base otherwise BS clear  Abdomen: soft +BS non-tender to palpation  Musculoskeletal: no clubbing no cyanosis   Data Reviewed: Basic Metabolic Panel:  Recent Labs Lab 07/26/13 0548 07/27/13 0444 07/28/13 0819 07/29/13 0446 07/30/13 0506  NA 133* 139 136 136 138  K 3.7 3.9 4.1 4.0 4.0  CL 101 107 102 102 104  CO2 27 23 27 26 26   GLUCOSE 106* 111* 106* 90 104*  BUN 9 7 4* 4* 3*  CREATININE 0.62 0.53 0.54 0.55 0.51  CALCIUM 8.3* 8.0* 8.6 8.6 8.7   Liver Function Tests:  Recent Labs Lab 07/25/13 1622  AST 87*  ALT 23  ALKPHOS 137*  BILITOT 10.2*  PROT 7.6  ALBUMIN 3.1*   No results found for this basename: LIPASE, AMYLASE,  in the last 168 hours No results found for this basename: AMMONIA,  in the last 168 hours CBC:  Recent Labs Lab 07/25/13 1622 07/26/13 0548 07/27/13 0444 07/28/13 0819 07/29/13 0446 07/30/13 0506  WBC 10.6* 8.5 10.1 14.5* 10.8* 10.5  NEUTROABS 7.1  --   --   --   --   --   HGB 8.4* 6.7* 6.3* 6.3* 7.8* 7.4*  HCT 22.6* 18.7* 17.1* 17.2* 21.7* 20.9*  MCV 101.8* 102.2* 101.2* 101.8* 95.6 95.4  PLT 251 159 189 214 224 201   Cardiac Enzymes: No results found for this basename: CKTOTAL, CKMB, CKMBINDEX, TROPONINI,  in the last 168 hours BNP (last 3 results) No results found for this basename: PROBNP,  in the last 8760 hours CBG: No results found for this basename: GLUCAP,  in the last 168 hours  Recent Results (from the past 240 hour(s))  URINE CULTURE     Status: None   Collection Time    07/25/13  4:34 PM      Result Value Range Status   Specimen Description URINE, CLEAN CATCH   Final   Special Requests NONE   Final   Culture  Setup Time     Final   Value: 07/25/2013 23:57     Performed at [facility] Count     Final   Value: NO GROWTH     Performed at [facility]   Culture     Final   Value: NO GROWTH      Performed at [facility]   Report Status 07/27/2013 FINAL   Final  CULTURE, BLOOD (ROUTINE X 2)     Status: None   Collection Time    07/26/13  6:41 PM      Result Value Range Status   Specimen Description BLOOD LEFT PORTA CATH DRAWN BY RN LM   Final   Special Requests BOTTLES DRAWN AEROBIC AND ANAEROBIC [facility] EACH   Final   Culture  Setup Time     Final   Value: 07/28/2013 01:13     Performed at [facility]   Culture     Final   Value: YEAST     Note: Gram Stain Report Called to,Read Back By and Verified With: ROGERS A AT 1234 ON 07/27/13 BY GAUGHAM M.     Performed at [facility]   Report Status PENDING   Incomplete  CULTURE, BLOOD (ROUTINE X 2)     Status: None   Collection Time    07/26/13  6:46 PM      Result Value Range Status   Specimen Description BLOOD RIGHT HAND   Final   Special Requests BOTTLES DRAWN AEROBIC AND ANAEROBIC [facility] EACH   Final   Culture  Setup Time     Final   Value: 07/28/2013 19:03     Performed at [facility]   Culture     Final   Value: YEAST     Note: Gram Stain Report Called to,Read Back By and Verified With: ROGERSN A AT 1234 ON 07/27/2013 BY BAUGHAM     Performed at [facility]   Report Status PENDING   Incomplete  CULTURE, BLOOD (ROUTINE X 2)     Status: None   Collection Time    07/27/13  7:15 PM      Result Value Range Status   Specimen Description BLOOD RIGHT HAND   Final   Special Requests BOTTLES DRAWN AEROBIC ONLY 6CC   Final   Culture NO GROWTH 2 DAYS   Final   Report Status PENDING   Incomplete  CULTURE, BLOOD (ROUTINE X 2)     Status: None   Collection Time    07/27/13  7:20 PM      Result Value Range Status   Specimen Description BLOOD RIGHT ANTECUBITAL   Final   Special Requests BOTTLES DRAWN AEROBIC AND ANAEROBIC 12CC EACH   Final   Culture NO GROWTH 2 DAYS   Final   Report Status PENDING   Incomplete  SURGICAL PCR SCREEN     Status: None  Collection Time    07/28/13 11:16 AM       Result Value Range Status   MRSA, PCR NEGATIVE  NEGATIVE Final   Staphylococcus aureus NEGATIVE  NEGATIVE Final   Comment:            The Xpert SA Assay (FDA     approved for NASAL specimens     in patients over 31 years of age),     is one component of     a comprehensive surveillance     program.  Test performance has     been validated by [employer] for patients greater     than or equal to 87 year old.     It is not intended     to diagnose infection nor to     guide or monitor treatment.     Studies: No results found.  Scheduled Meds: . deferasirox  1,500 mg Oral QAC breakfast  . docusate sodium  100 mg Oral BID  . folic acid  1 mg Oral Daily  . hydroxyurea  1,000 mg Oral QODAY   And  . hydroxyurea  500 mg Oral QODAY  . loratadine  10 mg Oral Daily  . micafungin [facility]) IV  100 mg Intravenous Daily  . pantoprazole (PROTONIX) IV  40 mg Intravenous Q24H   Continuous Infusions: . dextrose 5 % and 0.45 % NaCl with KCl 20 mEq/L 50 mL/hr at 07/29/13 1148    Active Problems:   Sickle cell anemia   Leukocytosis   HTN (hypertension)   Fever, unspecified   Fungemia    Time spent 30 minutes    [facility] M  [facility] Pager [phone]. If 7PM-7AM, please contact night-coverage at [email or web address], password [facility] 07/30/2013, 10:52 AM  LOS: 5 days

## 2013-07-31 ENCOUNTER — Encounter (HOSPITAL_COMMUNITY): Payer: Self-pay | Admitting: General Surgery

## 2013-07-31 DIAGNOSIS — I319 Disease of pericardium, unspecified: Secondary | ICD-10-CM

## 2013-07-31 LAB — CULTURE, BLOOD (ROUTINE X 2)

## 2013-07-31 LAB — COMPREHENSIVE METABOLIC PANEL
Albumin: 2.7 g/dL — ABNORMAL LOW (ref 3.5–5.2)
Alkaline Phosphatase: 161 U/L — ABNORMAL HIGH (ref 39–117)
BUN: 4 mg/dL — ABNORMAL LOW (ref 6–23)
CO2: 27 mEq/L (ref 19–32)
GFR calc Af Amer: >90 mL/min (ref >90)
Potassium: 4 mEq/L (ref 3.5–5.1)
Sodium: 135 mEq/L (ref 135–145)
Total Protein: 7.3 g/dL (ref 6.0–8.3)

## 2013-07-31 LAB — CBC
HCT: 21.3 % — ABNORMAL LOW (ref 36.0–46.0)
MCH: 35 pg — ABNORMAL HIGH (ref 26.0–34.0)
MCHC: 36.6 g/dL — ABNORMAL HIGH (ref 30.0–36.0)
MCV: 95.5 fL (ref 78.0–100.0)
RDW: 27.3 % — ABNORMAL HIGH (ref 11.5–15.5)

## 2013-07-31 MED ORDER — KETOROLAC TROMETHAMINE 30 MG/ML IJ SOLN
30.0000 mg | Freq: Four times a day (QID) | INTRAMUSCULAR | Status: AC
  Administered 2013-07-31 – 2013-08-05 (×20): 30 mg via INTRAVENOUS
  Filled 2013-07-31 (×21): qty 1

## 2013-07-31 MED ORDER — HYDROMORPHONE 0.3 MG/ML IV SOLN
INTRAVENOUS | Status: DC
  Administered 2013-07-31 – 2013-08-01 (×5): via INTRAVENOUS
  Administered 2013-08-02: 0.7 mg via INTRAVENOUS
  Administered 2013-08-02 (×3): via INTRAVENOUS
  Administered 2013-08-02: 0.3 mg via INTRAVENOUS
  Administered 2013-08-03 (×4): via INTRAVENOUS
  Administered 2013-08-03: 0.7 mg via INTRAVENOUS
  Filled 2013-07-31 (×15): qty 25

## 2013-07-31 MED ORDER — SENNOSIDES-DOCUSATE SODIUM 8.6-50 MG PO TABS
1.0000 | ORAL_TABLET | Freq: Two times a day (BID) | ORAL | Status: DC
  Administered 2013-08-02 – 2013-08-08 (×7): 1 via ORAL
  Filled 2013-07-31 (×15): qty 1

## 2013-07-31 MED ORDER — POLYETHYLENE GLYCOL 3350 17 G PO PACK
17.0000 g | PACK | Freq: Every day | ORAL | Status: DC
  Administered 2013-08-02 – 2013-08-06 (×3): 17 g via ORAL
  Filled 2013-07-31 (×7): qty 1

## 2013-07-31 MED ORDER — HYDROMORPHONE 0.3 MG/ML IV SOLN
INTRAVENOUS | Status: DC
  Administered 2013-07-31: 0.7 mg via INTRAVENOUS
  Administered 2013-07-31: 12.42 mg via INTRAVENOUS
  Administered 2013-07-31: 19:00:00 via INTRAVENOUS
  Filled 2013-07-31 (×3): qty 25

## 2013-07-31 NOTE — Progress Notes (Signed)
*  PRELIMINARY RESULTS* Echocardiogram 2D Echocardiogram has been performed.  Dawn [name] 07/31/2013, 4:35 PM

## 2013-07-31 NOTE — Progress Notes (Signed)
[facility] PROGRESS NOTE  Dawn [name] ZOX:[id number] DOB: Dec 04, 1980 DOA: 07/25/2013 PCP: [name],[name], MD  Assessment/Plan: 1. Sickle cell anemia with acute pain crisis, hemolysis: Poor pain control. Will change to PCA.  Continue analgesics, fluids for hydration. S/P 1 unit PRBC's for HG 6.8 Currently Hg 7.8. Continue Toradol for adjunct therapy. Cbc in am 2. Fever: Likely related to fungemia. Afebrile last 48 hours. Continue micafungin day #5. Urinalysis was equivocal, Repeat chest x-ray was negative. She remains non toxic and non hypoxic. Her sternal pain is consistent with her at usual crisis pain. No indication of acute chest syndrome at this point.  3. SSA : Continue Exjade, Foley catheter, hydroxyurea 4. Fungemia: likely from porta cath.Porta cath removed 07/29/13. Will repeat blood cultures. Micafungin day #5.  5. Tachycardia:Remains Mild. likely related to pain/ infection. Hemodynamically stable. Will continue IV hydration.  Code Status: full Family Communication: none present Disposition Plan: home when ready. Will need porta cath replaced once clean blood culture results   Consultants:  none  Procedures:  Porta cath removal 07/29/13>>>  Antibiotics:Micofungin 07/27/13 HPI/Subjective: Sitting up in bed. Complains pain in chest but pain medicine better control  Objective: Filed Vitals:   07/31/13 0623  BP: 102/46  Pulse: 98  Temp: 98.6 F (37 C)  Resp: 16    Intake/Output Summary (Last 24 hours) at 07/31/13 0914 Last data filed at 07/30/13 1316  Gross per 24 hour  Intake    240 ml  Output      0 ml  Net    240 ml   Filed Weights   07/27/13 0459 07/28/13 0500 07/29/13 0807  Weight: 70.432 kg (155 lb 4.4 oz) 69.627 kg (153 lb 8 oz) 69.4 kg (153 lb)    Exam:   General:  Well nourished NAD  Cardiovascular: RRR No MGR No LE edema PPP  Respiratory: normal effort BS slightly distant but clear no wheeze  Abdomen: soft +BS non-tender to  palpation  Musculoskeletal: good muscle tone No clubbing or cyanosis   Data Reviewed: Basic Metabolic Panel:  Recent Labs Lab 07/26/13 0548 07/27/13 0444 07/28/13 0819 07/29/13 0446 07/30/13 0506  NA 133* 139 136 136 138  K 3.7 3.9 4.1 4.0 4.0  CL 101 107 102 102 104  CO2 27 23 27 26 26   GLUCOSE 106* 111* 106* 90 104*  BUN 9 7 4* 4* 3*  CREATININE 0.62 0.53 0.54 0.55 0.51  CALCIUM 8.3* 8.0* 8.6 8.6 8.7   Liver Function Tests:  Recent Labs Lab 07/25/13 1622  AST 87*  ALT 23  ALKPHOS 137*  BILITOT 10.2*  PROT 7.6  ALBUMIN 3.1*   No results found for this basename: LIPASE, AMYLASE,  in the last 168 hours No results found for this basename: AMMONIA,  in the last 168 hours CBC:  Recent Labs Lab 07/25/13 1622  07/27/13 0444 07/28/13 0819 07/29/13 0446 07/30/13 0506 07/31/13 0512  WBC 10.6*  < > 10.1 14.5* 10.8* 10.5 9.7  NEUTROABS 7.1  --   --   --   --   --   --   HGB 8.4*  < > 6.3* 6.3* 7.8* 7.4* 7.8*  HCT 22.6*  < > 17.1* 17.2* 21.7* 20.9* 21.3*  MCV 101.8*  < > 101.2* 101.8* 95.6 95.4 95.5  PLT 251  < > 189 214 224 201 200  < > = values in this interval not displayed. Cardiac Enzymes: No results found for this basename: CKTOTAL, CKMB, CKMBINDEX, TROPONINI,  in the  last 168 hours BNP (last 3 results) No results found for this basename: PROBNP,  in the last 8760 hours CBG: No results found for this basename: GLUCAP,  in the last 168 hours  Recent Results (from the past 240 hour(s))  URINE CULTURE     Status: None   Collection Time    07/25/13  4:34 PM      Result Value Range Status   Specimen Description URINE, CLEAN CATCH   Final   Special Requests NONE   Final   Culture  Setup Time     Final   Value: 07/25/2013 23:57     Performed at [facility] Count     Final   Value: NO GROWTH     Performed at [facility]   Culture     Final   Value: NO GROWTH     Performed at [facility]   Report Status 07/27/2013 FINAL    Final  CULTURE, BLOOD (ROUTINE X 2)     Status: None   Collection Time    07/26/13  6:41 PM      Result Value Range Status   Specimen Description BLOOD LEFT PORTA CATH DRAWN BY RN LM   Final   Special Requests BOTTLES DRAWN AEROBIC AND ANAEROBIC [facility] EACH   Final   Culture  Setup Time     Final   Value: 07/28/2013 01:13     Performed at [facility]   Culture     Final   Value: YEAST     Note: Gram Stain Report Called to,Read Back By and Verified With: ROGERS A AT 1234 ON 07/27/13 BY GAUGHAM M.     Performed at [facility]   Report Status PENDING   Incomplete  CULTURE, BLOOD (ROUTINE X 2)     Status: None   Collection Time    07/26/13  6:46 PM      Result Value Range Status   Specimen Description BLOOD RIGHT HAND   Final   Special Requests BOTTLES DRAWN AEROBIC AND ANAEROBIC [facility] EACH   Final   Culture  Setup Time     Final   Value: 07/28/2013 19:03     Performed at [facility]   Culture     Final   Value: YEAST     Note: Gram Stain Report Called to,Read Back By and Verified With: ROGERSN A AT 1234 ON 07/27/2013 BY BAUGHAM     Performed at [facility]   Report Status PENDING   Incomplete  CULTURE, BLOOD (ROUTINE X 2)     Status: None   Collection Time    07/27/13  7:15 PM      Result Value Range Status   Specimen Description BLOOD RIGHT HAND   Final   Special Requests BOTTLES DRAWN AEROBIC ONLY 6CC   Final   Culture NO GROWTH 3 DAYS   Final   Report Status PENDING   Incomplete  CULTURE, BLOOD (ROUTINE X 2)     Status: None   Collection Time    07/27/13  7:20 PM      Result Value Range Status   Specimen Description BLOOD RIGHT ANTECUBITAL   Final   Special Requests BOTTLES DRAWN AEROBIC AND ANAEROBIC 12CC EACH   Final   Culture NO GROWTH 3 DAYS   Final   Report Status PENDING   Incomplete  SURGICAL PCR SCREEN     Status: None   Collection  Time    07/28/13 11:16 AM      Result Value Range Status   MRSA, PCR NEGATIVE  NEGATIVE Final    Staphylococcus aureus NEGATIVE  NEGATIVE Final   Comment:            The Xpert SA Assay (FDA     approved for NASAL specimens     in patients over 52 years of age),     is one component of     a comprehensive surveillance     program.  Test performance has     been validated by [employer] for patients greater     than or equal to 42 year old.     It is not intended     to diagnose infection nor to     guide or monitor treatment.     Studies: No results found.  Scheduled Meds: . deferasirox  1,500 mg Oral QAC breakfast  . docusate sodium  100 mg Oral BID  . folic acid  1 mg Oral Daily  . hydroxyurea  1,000 mg Oral QODAY   And  . hydroxyurea  500 mg Oral QODAY  . loratadine  10 mg Oral Daily  . micafungin [facility]) IV  100 mg Intravenous Daily  . pantoprazole  40 mg Oral Daily   Continuous Infusions: . dextrose 5 % and 0.45 % NaCl with KCl 20 mEq/L 50 mL/hr at 07/30/13 2324    Active Problems:   Sickle cell anemia   Leukocytosis   HTN (hypertension)   Fever, unspecified   Fungemia    Time spent: 30 minutes    [facility] M  [facility] Pager [phone]. If 7PM-7AM, please contact night-coverage at [email or web address], password [facility] 07/31/2013, 9:14 AM  LOS: 6 days

## 2013-07-31 NOTE — Progress Notes (Signed)
Patient ID: Dawn [name], female   DOB: 10-19-1980, 32 y.o.   MRN: [id number] Pt called office expressing concerns about port replacement. She is scared to have her port replaced before she has completed her antibiotics. She also is concerned that her pain is being sub-optimally treated. I spoke with NP Toya Smothers who is attending the patient. She states that patient's pain was not well controlled on PCA. I have spoken with the patient and she reports that her current dosing is only decreasing pain for about 30 minutes at the most. I have explained to the patient that the PCA is likely the most effective. Pt is receptive to PCA as she knows that it has worked in the past. Her concern is that she will have no coverage overnight and will have to remain awake to use the PCA. I have discussed the PCA use with Toya Smothers and dosing and the use of continuous dosing overnight.

## 2013-07-31 NOTE — Progress Notes (Signed)
Patient seen and examined. Agree with not as above.  Patient is having significant chest pain related to her sickle cell anemia in crisis. Case was discussed with patient's primary care physician [name] who manages her sickle cell. Recommendations were to change the patient to PCA for pain relief. These changes have been made and will be continued through the weekend. We'll continue with Toradol and discontinue IV fluids.  We will continue on antifungals. Blood cultures have been repeated today and will need to be monitored for the next 48 hours to see if there is any growth. Does not any growth, we'll place PICC line to complete a course of antifungals. The patient wishes to followup with [name] as an outpatient to discuss need for placement of another port.  Dawn [name]

## 2013-08-01 LAB — CULTURE, BLOOD (ROUTINE X 2)
Culture: NO GROWTH
Culture: NO GROWTH

## 2013-08-01 LAB — CBC
HCT: 23.6 % — ABNORMAL LOW (ref 36.0–46.0)
MCV: 97.5 fL (ref 78.0–100.0)
Platelets: 250 10*3/uL (ref 150–400)
RDW: 27.9 % — ABNORMAL HIGH (ref 11.5–15.5)
WBC: 9.9 10*3/uL (ref 4.0–10.5)

## 2013-08-01 LAB — CATH TIP CULTURE: Culture: 15

## 2013-08-01 NOTE — Progress Notes (Signed)
[facility] PROGRESS NOTE  Dawn [name] Swallows ZOX:[id number] DOB: 1981/08/11 DOA: 07/25/2013 PCP: [name],[name], MD  Assessment/Plan: 1. Sickle cell anemia with acute pain crisis, hemolysis: Patient started on PCA dilaudid yesterday for better pain control. Adjustments were made to PCA dosing after discussion with patient's primary care doctor [name]. Continue analgesics. Patient reports some improvement in her pain since being started on PCA. S/P 1 unit PRBC's for HG 6.8. Currently Hg 8.2. Continue Toradol for adjunct therapy. Cbc in am 2. Fever: Likely related to fungemia. Afebrile last 48 hours. Continue micafungin day #6. Urinalysis was equivocal, Repeat chest x-ray was negative. She remains non toxic and non hypoxic. Her sternal pain is consistent with her at usual crisis pain. No indication of acute chest syndrome at this point.  3. SSA : Continue Exjade, Folic acid, hydroxyurea.  4. Fungemia: likely from porta cath.Porta cath removed 07/29/13.  repeat blood cultures pending. Micafungin day #6. Will need to discuss course of treatment with infectious disease.  2D echo does not show any vegetations.   Code Status: full Family Communication: none present Disposition Plan: home when ready. Hopefully by Monday.   Consultants:  General surgery, Dr. Malvin Johns.  Procedures:  Porta cath removal 07/29/13>>>  Antibiotics: Micofungin 07/27/13  HPI/Subjective: Sitting up in bed. Feels pain is better today since being started on pca last night.  Objective: Filed Vitals:   08/01/13 1002  BP:   Pulse:   Temp:   Resp: 17    Intake/Output Summary (Last 24 hours) at 08/01/13 1231 Last data filed at 07/31/13 1700  Gross per 24 hour  Intake    240 ml  Output      0 ml  Net    240 ml   Filed Weights   07/28/13 0500 07/29/13 0807 08/01/13 0623  Weight: 69.627 kg (153 lb 8 oz) 69.4 kg (153 lb) 71.623 kg (157 lb 14.4 oz)    Exam:   General:  Well nourished  NAD  Cardiovascular: RRR No MGR No LE edema PPP  Respiratory: normal effort BS slightly distant but clear no wheeze  Abdomen: soft +BS non-tender to palpation  Musculoskeletal: good muscle tone No clubbing or cyanosis   Data Reviewed: Basic Metabolic Panel:  Recent Labs Lab 07/27/13 0444 07/28/13 0819 07/29/13 0446 07/30/13 0506 07/31/13 0946  NA 139 136 136 138 135  K 3.9 4.1 4.0 4.0 4.0  CL 107 102 102 104 99  CO2 23 27 26 26 27   GLUCOSE 111* 106* 90 104* 106*  BUN 7 4* 4* 3* 4*  CREATININE 0.53 0.54 0.55 0.51 0.54  CALCIUM 8.0* 8.6 8.6 8.7 8.9   Liver Function Tests:  Recent Labs Lab 07/25/13 1622 07/31/13 0946  AST 87* 68*  ALT 23 22  ALKPHOS 137* 161*  BILITOT 10.2* 5.6*  PROT 7.6 7.3  ALBUMIN 3.1* 2.7*   No results found for this basename: LIPASE, AMYLASE,  in the last 168 hours No results found for this basename: AMMONIA,  in the last 168 hours CBC:  Recent Labs Lab 07/25/13 1622  07/28/13 0819 07/29/13 0446 07/30/13 0506 07/31/13 0512 08/01/13 0653  WBC 10.6*  < > 14.5* 10.8* 10.5 9.7 9.9  NEUTROABS 7.1  --   --   --   --   --   --   HGB 8.4*  < > 6.3* 7.8* 7.4* 7.8* 8.2*  HCT 22.6*  < > 17.2* 21.7* 20.9* 21.3* 23.6*  MCV 101.8*  < > 101.8* 95.6 95.4  95.5 97.5  PLT 251  < > 214 224 201 200 250  < > = values in this interval not displayed. Cardiac Enzymes: No results found for this basename: CKTOTAL, CKMB, CKMBINDEX, TROPONINI,  in the last 168 hours BNP (last 3 results) No results found for this basename: PROBNP,  in the last 8760 hours CBG: No results found for this basename: GLUCAP,  in the last 168 hours  Recent Results (from the past 240 hour(s))  URINE CULTURE     Status: None   Collection Time    07/25/13  4:34 PM      Result Value Range Status   Specimen Description URINE, CLEAN CATCH   Final   Special Requests NONE   Final   Culture  Setup Time     Final   Value: 07/25/2013 23:57     Performed at [facility] Count     Final   Value: NO GROWTH     Performed at [facility]   Culture     Final   Value: NO GROWTH     Performed at [facility]   Report Status 07/27/2013 FINAL   Final  CULTURE, BLOOD (ROUTINE X 2)     Status: None   Collection Time    07/26/13  6:41 PM      Result Value Range Status   Specimen Description BLOOD LEFT PORTA CATH DRAWN BY RN LM   Final   Special Requests BOTTLES DRAWN AEROBIC AND ANAEROBIC [facility] EACH   Final   Culture  Setup Time     Final   Value: 07/28/2013 01:13     Performed at [facility]   Culture     Final   Value: CANDIDA ALBICANS     Note: Gram Stain Report Called to,Read Back By and Verified With: ROGERS A AT 1234 ON 07/27/13 BY GAUGHAM M.     Performed at [facility]   Report Status 07/31/2013 FINAL   Final  CULTURE, BLOOD (ROUTINE X 2)     Status: None   Collection Time    07/26/13  6:46 PM      Result Value Range Status   Specimen Description BLOOD RIGHT HAND   Final   Special Requests BOTTLES DRAWN AEROBIC AND ANAEROBIC [facility] EACH   Final   Culture  Setup Time     Final   Value: 07/28/2013 19:03     Performed at [facility]   Culture     Final   Value: CANDIDA ALBICANS     Note: Gram Stain Report Called to,Read Back By and Verified With: ROGERSN A AT 1234 ON 07/27/2013 BY BAUGHAM     Performed at [facility]   Report Status 08/01/2013 FINAL   Final  CULTURE, BLOOD (ROUTINE X 2)     Status: None   Collection Time    07/27/13  7:15 PM      Result Value Range Status   Specimen Description BLOOD RIGHT HAND   Final   Special Requests BOTTLES DRAWN AEROBIC ONLY 6CC   Final   Culture NO GROWTH 5 DAYS   Final   Report Status 08/01/2013 FINAL   Final  CULTURE, BLOOD (ROUTINE X 2)     Status: None   Collection Time    07/27/13  7:20 PM      Result Value Range Status   Specimen Description BLOOD RIGHT ANTECUBITAL   Final  Special Requests BOTTLES DRAWN AEROBIC AND ANAEROBIC 12CC EACH    Final   Culture NO GROWTH 5 DAYS   Final   Report Status 08/01/2013 FINAL   Final  SURGICAL PCR SCREEN     Status: None   Collection Time    07/28/13 11:16 AM      Result Value Range Status   MRSA, PCR NEGATIVE  NEGATIVE Final   Staphylococcus aureus NEGATIVE  NEGATIVE Final   Comment:            The Xpert SA Assay (FDA     approved for NASAL specimens     in patients over 21 years of age),     is one component of     a comprehensive surveillance     program.  Test performance has     been validated by [employer] for patients greater     than or equal to 25 year old.     It is not intended     to diagnose infection nor to     guide or monitor treatment.  CATH TIP CULTURE     Status: None   Collection Time    07/29/13 10:10 AM      Result Value Range Status   Specimen Description CATH TIP LEFT PORTA CATH   Final   Special Requests NONE   Final   Culture     Final   Value: 15 COLONIES CANDIDA ALBICANS     Performed at [facility]   Report Status 08/01/2013 FINAL   Final  CULTURE, BLOOD (ROUTINE X 2)     Status: None   Collection Time    07/31/13  9:53 AM      Result Value Range Status   Specimen Description Blood RIGHT ARM   Final   Special Requests BOTTLES DRAWN AEROBIC AND ANAEROBIC 8 CC EACH   Final   Culture NO GROWTH 1 DAY   Final   Report Status PENDING   Incomplete  CULTURE, BLOOD (ROUTINE X 2)     Status: None   Collection Time    07/31/13  9:53 AM      Result Value Range Status   Specimen Description Blood RIGHT HAND   Final   Special Requests BOTTLES DRAWN AEROBIC AND ANAEROBIC 8CC EACH   Final   Culture NO GROWTH 1 DAY   Final   Report Status PENDING   Incomplete     Studies: No results found.  Scheduled Meds: . deferasirox  1,500 mg Oral QAC breakfast  . folic acid  1 mg Oral Daily  . hydroxyurea  1,000 mg Oral QODAY   And  . hydroxyurea  500 mg Oral QODAY  . ketorolac  30 mg Intravenous Q6H  . loratadine  10 mg Oral Daily  .  micafungin [facility]) IV  100 mg Intravenous Daily  . pantoprazole  40 mg Oral Daily  . polyethylene glycol  17 g Oral Daily  . senna-docusate  1 tablet Oral BID   Continuous Infusions: . dextrose 5 % and 0.45 % NaCl with KCl 20 mEq/L 20 mL/hr (07/31/13 2016)  . HYDROmorphone PCA 0.3 mg/mL      Active Problems:   Sickle cell anemia   Leukocytosis   HTN (hypertension)   Fever, unspecified   Fungemia    Time spent: 30 minutes    MEMON,JEHANZEB  [facility] Pager [phone]. If 7PM-7AM, please contact night-coverage at [email or web address], password [facility] 08/01/2013, 12:31  PM  LOS: 7 days

## 2013-08-02 MED ORDER — FLUCONAZOLE 100 MG PO TABS: 400.0000 mg | ORAL_TABLET | Freq: Every day | ORAL | Status: DC

## 2013-08-02 MED ORDER — FLUCONAZOLE 100 MG PO TABS
400.0000 mg | ORAL_TABLET | ORAL | Status: DC
  Administered 2013-08-02 – 2013-08-09 (×7): 400 mg via ORAL
  Filled 2013-08-02 (×7): qty 4

## 2013-08-02 NOTE — Progress Notes (Signed)
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 Adjustments were made to PCA dosing after discussion with patient's primary care doctor [name]. Continue analgesics. Patient reports pain is worse today since pca had to be stopped for antibiotics to infuse. Patient has only used 2.5mg  dilaudid in the last 4 hours.  Will continue current dose to get pain under control and plan on weaning tomorrow. S/P 1 unit PRBC's for HG 6.8. Currently Hg 8.2. Continue Toradol for adjunct therapy. Cbc in am 1. Fever: Likely related to fungemia. Afebrile last 48 hours. Continue micafungin day #7. Urinalysis was equivocal, Repeat chest x-ray was negative. She remains non toxic and non hypoxic. Her sternal pain is consistent with her at usual crisis pain. No indication of acute chest syndrome at this point.  2. SSA : Continue Exjade, Folic acid, hydroxyurea.  3. Fungemia: likely from porta cath.Porta cath removed 07/29/13.  repeat blood cultures have shown no growth. Micafungin day #7. Discussed cultures and course of treatment with Dr. [name], Infectious Disease.  Recommendations were to discontinue Micafungin and start patient on Fluconazole 400mg  po daily for a total of 2 weeks of therapy from the first set of negative blood cultures.  Fluconazole will need to be completed until 08/10/13.  2D echo does not show any vegetations.   Code Status: full Family Communication: none present Disposition Plan: home when ready. Hopefully by Monday.   Consultants:  General surgery, Dr. Malvin Johns.  Procedures:  Porta cath removal 07/29/13  Antibiotics: Micofungin 07/27/13>>08/02/13 Fluconazole 08/02/13  HPI/Subjective: Sitting up in bed.  Feels pain in chest is worse today since PCA was turned off to let  antibiotic infuse.  Objective: Filed Vitals:   08/02/13 1500  BP: 123/61  Pulse: 88  Temp:   Resp: 18    Intake/Output Summary (Last 24 hours) at 08/02/13 1657 Last data filed at 08/01/13 1756  Gross per 24 hour  Intake    480 ml  Output      0 ml  Net    480 ml   Filed Weights   07/28/13 0500 07/29/13 0807 08/01/13 0623  Weight: 69.627 kg (153 lb 8 oz) 69.4 kg (153 lb) 71.623 kg (157 lb 14.4 oz)    Exam:   General:  Well nourished NAD  Cardiovascular: RRR No MGR No LE edema PPP  Respiratory: normal effort BS slightly distant but clear no wheeze  Abdomen: soft +BS non-tender to palpation  Musculoskeletal: good muscle tone No clubbing or cyanosis   Data Reviewed: Basic Metabolic Panel:  Recent Labs Lab 07/27/13 0444 07/28/13 0819 07/29/13 0446 07/30/13 0506 07/31/13 0946  NA 139 136 136 138 135  K 3.9 4.1 4.0 4.0 4.0  CL 107 102 102 104 99  CO2 23 27 26 26 27   GLUCOSE 111* 106* 90 104* 106*  BUN 7 4* 4* 3* 4*  CREATININE 0.53 0.54 0.55 0.51 0.54  CALCIUM 8.0* 8.6 8.6 8.7 8.9   Liver Function Tests:  Recent Labs Lab 07/31/13 0946  AST 68*  ALT 22  ALKPHOS 161*  BILITOT 5.6*  PROT 7.3  ALBUMIN 2.7*   No results found for this basename: LIPASE, AMYLASE,  in the last 168 hours No results found for this basename: AMMONIA,  in the last 168 hours CBC:  Recent Labs Lab 07/28/13 0819 07/29/13 0446  07/30/13 0506 07/31/13 0512 08/01/13 0653  WBC 14.5* 10.8* 10.5 9.7 9.9  HGB 6.3* 7.8* 7.4* 7.8* 8.2*  HCT 17.2* 21.7* 20.9* 21.3* 23.6*  MCV 101.8* 95.6 95.4 95.5 97.5  PLT 214 224 201 200 250   Cardiac Enzymes: No results found for this basename: CKTOTAL, CKMB, CKMBINDEX, TROPONINI,  in the last 168 hours BNP (last 3 results) No results found for this basename: PROBNP,  in the last 8760 hours CBG: No results found for this basename: GLUCAP,  in the last 168 hours  Recent Results (from the past 240 hour(s))  URINE CULTURE     Status: None    Collection Time    07/25/13  4:34 PM      Result Value Range Status   Specimen Description URINE, CLEAN CATCH   Final   Special Requests NONE   Final   Culture  Setup Time     Final   Value: 07/25/2013 23:57     Performed at [facility] Count     Final   Value: NO GROWTH     Performed at [facility]   Culture     Final   Value: NO GROWTH     Performed at [facility]   Report Status 07/27/2013 FINAL   Final  CULTURE, BLOOD (ROUTINE X 2)     Status: None   Collection Time    07/26/13  6:41 PM      Result Value Range Status   Specimen Description BLOOD LEFT PORTA CATH DRAWN BY RN LM   Final   Special Requests BOTTLES DRAWN AEROBIC AND ANAEROBIC [facility] EACH   Final   Culture  Setup Time     Final   Value: 07/28/2013 01:13     Performed at [facility]   Culture     Final   Value: CANDIDA ALBICANS     Note: Gram Stain Report Called to,Read Back By and Verified With: ROGERS A AT 1234 ON 07/27/13 BY GAUGHAM M.     Performed at [facility]   Report Status 07/31/2013 FINAL   Final  CULTURE, BLOOD (ROUTINE X 2)     Status: None   Collection Time    07/26/13  6:46 PM      Result Value Range Status   Specimen Description BLOOD RIGHT HAND   Final   Special Requests BOTTLES DRAWN AEROBIC AND ANAEROBIC [facility] EACH   Final   Culture  Setup Time     Final   Value: 07/28/2013 19:03     Performed at [facility]   Culture     Final   Value: CANDIDA ALBICANS     Note: Gram Stain Report Called to,Read Back By and Verified With: ROGERSN A AT 1234 ON 07/27/2013 BY BAUGHAM     Performed at [facility]   Report Status 08/01/2013 FINAL   Final  CULTURE, BLOOD (ROUTINE X 2)     Status: None   Collection Time    07/27/13  7:15 PM      Result Value Range Status   Specimen Description BLOOD RIGHT HAND   Final   Special Requests BOTTLES DRAWN AEROBIC ONLY 6CC   Final   Culture NO GROWTH 5 DAYS   Final   Report Status 08/01/2013 FINAL    Final  CULTURE, BLOOD (ROUTINE X 2)     Status: None   Collection Time    07/27/13  7:20 PM  Result Value Range Status   Specimen Description BLOOD RIGHT ANTECUBITAL   Final   Special Requests BOTTLES DRAWN AEROBIC AND ANAEROBIC 12CC EACH   Final   Culture NO GROWTH 5 DAYS   Final   Report Status 08/01/2013 FINAL   Final  SURGICAL PCR SCREEN     Status: None   Collection Time    07/28/13 11:16 AM      Result Value Range Status   MRSA, PCR NEGATIVE  NEGATIVE Final   Staphylococcus aureus NEGATIVE  NEGATIVE Final   Comment:            The Xpert SA Assay (FDA     approved for NASAL specimens     in patients over 49 years of age),     is one component of     a comprehensive surveillance     program.  Test performance has     been validated by [employer] for patients greater     than or equal to 50 year old.     It is not intended     to diagnose infection nor to     guide or monitor treatment.  CATH TIP CULTURE     Status: None   Collection Time    07/29/13 10:10 AM      Result Value Range Status   Specimen Description CATH TIP LEFT PORTA CATH   Final   Special Requests NONE   Final   Culture     Final   Value: 15 COLONIES CANDIDA ALBICANS     Performed at [facility]   Report Status 08/01/2013 FINAL   Final  CULTURE, BLOOD (ROUTINE X 2)     Status: None   Collection Time    07/31/13  9:53 AM      Result Value Range Status   Specimen Description Blood RIGHT ARM   Final   Special Requests BOTTLES DRAWN AEROBIC AND ANAEROBIC 8 CC EACH   Final   Culture NO GROWTH 2 DAYS   Final   Report Status PENDING   Incomplete  CULTURE, BLOOD (ROUTINE X 2)     Status: None   Collection Time    07/31/13  9:53 AM      Result Value Range Status   Specimen Description Blood RIGHT HAND   Final   Special Requests BOTTLES DRAWN AEROBIC AND ANAEROBIC 8CC EACH   Final   Culture NO GROWTH 2 DAYS   Final   Report Status PENDING   Incomplete     Studies: No results  found.  Scheduled Meds: . deferasirox  1,500 mg Oral QAC breakfast  . fluconazole  400 mg Oral Q24H  . folic acid  1 mg Oral Daily  . hydroxyurea  1,000 mg Oral QODAY   And  . hydroxyurea  500 mg Oral QODAY  . ketorolac  30 mg Intravenous Q6H  . loratadine  10 mg Oral Daily  . pantoprazole  40 mg Oral Daily  . polyethylene glycol  17 g Oral Daily  . senna-docusate  1 tablet Oral BID   Continuous Infusions: . dextrose 5 % and 0.45 % NaCl with KCl 20 mEq/L 20 mL/hr at 08/01/13 1756  . HYDROmorphone PCA 0.3 mg/mL      Active Problems:   Sickle cell anemia   Leukocytosis   HTN (hypertension)   Fever, unspecified   Fungemia    Time spent: 30 minutes    MEMON,JEHANZEB  [facility] Pager [phone]. If 7PM-7AM, please contact night-coverage at [email or web address], password [facility] 08/02/2013, 4:57 PM  LOS: 8 days

## 2013-08-03 ENCOUNTER — Inpatient Hospital Stay (HOSPITAL_COMMUNITY): Payer: Medicare Other

## 2013-08-03 DIAGNOSIS — R079 Chest pain, unspecified: Secondary | ICD-10-CM | POA: Diagnosis present

## 2013-08-03 LAB — CBC
HCT: 21.7 % — ABNORMAL LOW (ref 36.0–46.0)
Hemoglobin: 7.5 g/dL — ABNORMAL LOW (ref 12.0–15.0)
MCH: 35 pg — ABNORMAL HIGH (ref 26.0–34.0)
MCHC: 34.6 g/dL (ref 30.0–36.0)
MCV: 101.4 fL — ABNORMAL HIGH (ref 78.0–100.0)
RDW: 29.1 % — ABNORMAL HIGH (ref 11.5–15.5)

## 2013-08-03 LAB — BASIC METABOLIC PANEL
BUN: 7 mg/dL (ref 6–23)
CO2: 28 mEq/L (ref 19–32)
Chloride: 105 mEq/L (ref 96–112)
Creatinine, Ser: 0.64 mg/dL (ref 0.50–1.10)
GFR calc Af Amer: >90 mL/min (ref >90)
Glucose, Bld: 95 mg/dL (ref 70–99)
Potassium: 4.3 mEq/L (ref 3.5–5.1)

## 2013-08-03 MED ORDER — HYDROMORPHONE HCL PF 1 MG/ML IJ SOLN
1.0000 mg | INTRAMUSCULAR | Status: AC
  Administered 2013-08-03: 1 mg via INTRAVENOUS
  Filled 2013-08-03: qty 1

## 2013-08-03 MED ORDER — SODIUM CHLORIDE 0.9 % IV SOLN
INTRAVENOUS | Status: DC
  Administered 2013-08-03 – 2013-08-06 (×3): via INTRAVENOUS

## 2013-08-03 NOTE — Progress Notes (Signed)
Nutrition Brief Note  Patient identified due to length of stay.   Patient Active Problem List   Diagnosis Date Noted  . Fungemia 07/28/2013  . Fever, unspecified 07/27/2013  . Hb-Ss disease with acute chest syndrome 05/19/2013  . Hypoxemia 05/14/2013  . 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 Hypokalemia 03/28/2012  . Hypoalbuminemia 03/28/2012  . 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 Leukocytosis 05/26/2011  . HTN (hypertension) 05/26/2011  . Elevated LFTs 04/27/2011  . Sickle cell anemia 04/27/2011  . Sickle cell disease 04/26/2011   Sodium  Date/Time Value Range Status  07/31/2013  9:46 AM 135  135 - 145 mEq/L Final  07/30/2013  5:06 AM 138  135 - 145 mEq/L Final  07/29/2013  4:46 AM 136  135 - 145 mEq/L Final    Potassium  Date/Time Value Range Status  07/31/2013  9:46 AM 4.0  3.5 - 5.1 mEq/L Final  07/30/2013  5:06 AM 4.0  3.5 - 5.1 mEq/L Final  07/29/2013  4:46 AM 4.0  3.5 - 5.1 mEq/L Final    Phosphorus  Date/Time Value Range Status  03/24/2012 12:07 PM 3.0  2.3 - 4.6 mg/dL Final    Magnesium  Date/Time Value Range Status  05/05/2013 10:50 AM 1.8  1.5 - 2.5 mg/dL Final  1/61/0960  4:54 AM 1.8  1.5 - 2.5 mg/dL Final  0/98/1191  4:78 AM 1.8  1.5 - 2.5 mg/dL Final     Wt Readings from Last 15 Encounters:  08/01/13 157 lb 14.4 oz (71.623 kg)  08/01/13 157 lb 14.4 oz (71.623 kg)  07/23/13 154 lb (69.854 kg)  05/27/13 154 lb 8 oz (70.081 kg)  05/14/13 154 lb 8.7 oz (70.1 kg)  05/01/13 152 lb (68.947 kg)  04/01/13 154 lb (69.854 kg)  03/02/13 154 lb (69.854 kg)  02/24/13 166 lb 8 oz (75.524 kg)  02/24/13 166 lb 8 oz (75.524 kg)  06/09/12 154 lb (69.854 kg)  12/11/12 167 lb (75.751 kg)  12/03/12 154 lb (69.854 kg)  11/13/12 154 lb (69.854 kg)  10/07/12 153 lb (69.4 kg)    Body mass index is 27.98 kg/(m^2). Patient meets criteria for overweight based on current BMI.   Current diet order is Regular, patient is consuming approximately  80-100% off meals at this time. Labs and medications reviewed.   No nutrition interventions warranted at this time. If nutrition issues arise, please consult RD.   Royann Shivers MS,RD,CSG,LDN Office: [phone] Pager: [phone]

## 2013-08-03 NOTE — Progress Notes (Signed)
[facility] PROGRESS NOTE  Dawn [name] ZOX:[id number] DOB: 1980/10/17 DOA: 07/25/2013 PCP: [name],[name], MD  Assessment/Plan: Sickle cell anemia with acute pain crisis, hemolysis: Patient started on PCA dilaudid on 11/7 for better pain control. Adjustments were made to PCA dosing after discussion with patient's primary care doctor [name]. Continue analgesics via PCA. Patient reports little relief in pain today since pca had to be stopped for antibiotics to infuse. Antibiotics changed to po yesterday.  Will continue current dose to get pain under control and plan on weaning tomorrow. S/P 1 unit PRBC's for HG 6.8. Currently Hg 7.5. Continue Toradol for adjunct therapy. Cbc in am  1. Fever: Likely related to fungemia. Resolved. Micafungin discontinued 08/02/13. Urinalysis was equivocal, Repeat chest x-ray was negative. She remains non toxic and non hypoxic. Her sternal pain is consistent with her at usual crisis pain. No indication of acute chest syndrome at this point.  2. SSA : Continue Exjade, Folic acid, hydroxyurea.  3. Fungemia: likely from porta cath.Porta cath removed 07/29/13. repeat blood cultures have shown no growth. Discussed cultures and course of treatment with Dr. [name], Infectious Disease 08/02/13. Recommendations were to discontinue Micafungin and start patient on Fluconazole 400mg  po daily for a total of 2 weeks of therapy from the first set of negative blood cultures. Fluconazole will need to be completed until 08/10/13. 2D echo does not show any vegetations.   Code Status: full Family Communication: none present Disposition Plan: home when ready hopefully 24 hours   Consultants:  ID  Surgery  Procedures: Porta cath removal 07/29/13  Antibiotics: Micofungin 07/27/13>>08/02/13  Fluconazole 08/02/13  HPI/Subjective: Sitting up watching TV. Reports continued pain sternal area.   Objective: Filed Vitals:   08/03/13 1020  BP: 112/72  Pulse: 103  Temp:  98.3 F (36.8 C)  Resp: 18    Intake/Output Summary (Last 24 hours) at 08/03/13 1148 Last data filed at 08/03/13 0900  Gross per 24 hour  Intake 1454.33 ml  Output      0 ml  Net 1454.33 ml   Filed Weights   07/28/13 0500 07/29/13 0807 08/01/13 0623  Weight: 69.627 kg (153 lb 8 oz) 69.4 kg (153 lb) 71.623 kg (157 lb 14.4 oz)    Exam:   General:  Well nourished NAD  Cardiovascular: RRR No MGR No LE edema  Respiratory: normal effort BS clear bilaterally no wheeze no rhonchi  Abdomen: round soft +BS non-tender to palpation  Musculoskeletal: good muscle tone no clubbing no cyanosis   Data Reviewed: Basic Metabolic Panel:  Recent Labs Lab 07/28/13 0819 07/29/13 0446 07/30/13 0506 07/31/13 0946 08/03/13 0533  NA 136 136 138 135 140  K 4.1 4.0 4.0 4.0 4.3  CL 102 102 104 99 105  CO2 27 26 26 27 28   GLUCOSE 106* 90 104* 106* 95  BUN 4* 4* 3* 4* 7  CREATININE 0.54 0.55 0.51 0.54 0.64  CALCIUM 8.6 8.6 8.7 8.9 8.9   Liver Function Tests:  Recent Labs Lab 07/31/13 0946  AST 68*  ALT 22  ALKPHOS 161*  BILITOT 5.6*  PROT 7.3  ALBUMIN 2.7*   No results found for this basename: LIPASE, AMYLASE,  in the last 168 hours No results found for this basename: AMMONIA,  in the last 168 hours CBC:  Recent Labs Lab 07/29/13 0446 07/30/13 0506 07/31/13 0512 08/01/13 0653 08/03/13 0533  WBC 10.8* 10.5 9.7 9.9 8.9  HGB 7.8* 7.4* 7.8* 8.2* 7.5*  HCT 21.7* 20.9* 21.3* 23.6* 21.7*  MCV 95.6 95.4 95.5 97.5 101.4*  PLT 224 201 200 250 236   Cardiac Enzymes: No results found for this basename: CKTOTAL, CKMB, CKMBINDEX, TROPONINI,  in the last 168 hours BNP (last 3 results) No results found for this basename: PROBNP,  in the last 8760 hours CBG: No results found for this basename: GLUCAP,  in the last 168 hours  Recent Results (from the past 240 hour(s))  URINE CULTURE     Status: None   Collection Time    07/25/13  4:34 PM      Result Value Range Status    Specimen Description URINE, CLEAN CATCH   Final   Special Requests NONE   Final   Culture  Setup Time     Final   Value: 07/25/2013 23:57     Performed at [facility] Count     Final   Value: NO GROWTH     Performed at [facility]   Culture     Final   Value: NO GROWTH     Performed at [facility]   Report Status 07/27/2013 FINAL   Final  CULTURE, BLOOD (ROUTINE X 2)     Status: None   Collection Time    07/26/13  6:41 PM      Result Value Range Status   Specimen Description BLOOD LEFT PORTA CATH DRAWN BY RN LM   Final   Special Requests BOTTLES DRAWN AEROBIC AND ANAEROBIC [facility] EACH   Final   Culture  Setup Time     Final   Value: 07/28/2013 01:13     Performed at [facility]   Culture     Final   Value: CANDIDA ALBICANS     Note: Gram Stain Report Called to,Read Back By and Verified With: ROGERS A AT 1234 ON 07/27/13 BY GAUGHAM M.     Performed at [facility]   Report Status 07/31/2013 FINAL   Final  CULTURE, BLOOD (ROUTINE X 2)     Status: None   Collection Time    07/26/13  6:46 PM      Result Value Range Status   Specimen Description BLOOD RIGHT HAND   Final   Special Requests BOTTLES DRAWN AEROBIC AND ANAEROBIC [facility] EACH   Final   Culture  Setup Time     Final   Value: 07/28/2013 19:03     Performed at [facility]   Culture     Final   Value: CANDIDA ALBICANS     Note: Gram Stain Report Called to,Read Back By and Verified With: ROGERSN A AT 1234 ON 07/27/2013 BY BAUGHAM     Performed at [facility]   Report Status 08/01/2013 FINAL   Final  CULTURE, BLOOD (ROUTINE X 2)     Status: None   Collection Time    07/27/13  7:15 PM      Result Value Range Status   Specimen Description BLOOD RIGHT HAND   Final   Special Requests BOTTLES DRAWN AEROBIC ONLY 6CC   Final   Culture NO GROWTH 5 DAYS   Final   Report Status 08/01/2013 FINAL   Final  CULTURE, BLOOD (ROUTINE X 2)     Status: None   Collection Time     07/27/13  7:20 PM      Result Value Range Status   Specimen Description BLOOD RIGHT ANTECUBITAL   Final   Special Requests BOTTLES DRAWN AEROBIC AND ANAEROBIC  12CC EACH   Final   Culture NO GROWTH 5 DAYS   Final   Report Status 08/01/2013 FINAL   Final  SURGICAL PCR SCREEN     Status: None   Collection Time    07/28/13 11:16 AM      Result Value Range Status   MRSA, PCR NEGATIVE  NEGATIVE Final   Staphylococcus aureus NEGATIVE  NEGATIVE Final   Comment:            The Xpert SA Assay (FDA     approved for NASAL specimens     in patients over 68 years of age),     is one component of     a comprehensive surveillance     program.  Test performance has     been validated by [employer] for patients greater     than or equal to 59 year old.     It is not intended     to diagnose infection nor to     guide or monitor treatment.  CATH TIP CULTURE     Status: None   Collection Time    07/29/13 10:10 AM      Result Value Range Status   Specimen Description CATH TIP LEFT PORTA CATH   Final   Special Requests NONE   Final   Culture     Final   Value: 15 COLONIES CANDIDA ALBICANS     Performed at [facility]   Report Status 08/01/2013 FINAL   Final  CULTURE, BLOOD (ROUTINE X 2)     Status: None   Collection Time    07/31/13  9:53 AM      Result Value Range Status   Specimen Description Blood RIGHT ARM   Final   Special Requests BOTTLES DRAWN AEROBIC AND ANAEROBIC 8 CC EACH   Final   Culture NO GROWTH 2 DAYS   Final   Report Status PENDING   Incomplete  CULTURE, BLOOD (ROUTINE X 2)     Status: None   Collection Time    07/31/13  9:53 AM      Result Value Range Status   Specimen Description Blood RIGHT HAND   Final   Special Requests BOTTLES DRAWN AEROBIC AND ANAEROBIC 8CC EACH   Final   Culture NO GROWTH 2 DAYS   Final   Report Status PENDING   Incomplete     Studies: No results found.  Scheduled Meds: . deferasirox  1,500 mg Oral QAC breakfast  .  fluconazole  400 mg Oral Q24H  . folic acid  1 mg Oral Daily  . hydroxyurea  1,000 mg Oral QODAY   And  . hydroxyurea  500 mg Oral QODAY  . ketorolac  30 mg Intravenous Q6H  . loratadine  10 mg Oral Daily  . pantoprazole  40 mg Oral Daily  . polyethylene glycol  17 g Oral Daily  . senna-docusate  1 tablet Oral BID   Continuous Infusions: . dextrose 5 % and 0.45 % NaCl with KCl 20 mEq/L 20 mL/hr (08/03/13 0615)  . HYDROmorphone PCA 0.3 mg/mL      Active Problems:   Sickle cell anemia   Leukocytosis   HTN (hypertension)   Fever, unspecified   Fungemia    Time spent: 72    [facility] M  [facility] Pager [phone]. If 7PM-7AM, please contact night-coverage at [email or web address], password [facility] 08/03/2013, 11:48 AM  LOS: 9 days

## 2013-08-03 NOTE — Plan of Care (Signed)
Pt had a fairly bad bloody nose - stopped and cleaned up pt.

## 2013-08-03 NOTE — Progress Notes (Signed)
Patient having nose bleed and complaining of blood clots from nose. I explained that this is not unusual when nose bleeds, she seemed to be afraid she was bleeding to death however i assured her that was not the case. Gave her an ice pack for her nose

## 2013-08-03 NOTE — Plan of Care (Signed)
Pt complaining on chest pain mid sternum area that won't go away.  Pt rated it 8 (1-10) and having diff catching breath.  Pt vitals were 98.4, 114/76, 108, 18, 98 (2L).  Stat 12 lead was ordered and DR. Memon contacted. (texted).

## 2013-08-03 NOTE — Progress Notes (Signed)
Patient seen and examined.  Above note reviewed.  Patient reports worsening of chest pain today, likely related to sickle cell crisis.  EKG done was unremarkable, and chest xray did not show any acute findings.  She is on PCA pump.  Patient given extra dose of dilaudid now.  Restart IV fluids and monitor.  She is on Fluconazole for candida fungemia.   MEMON,JEHANZEB

## 2013-08-04 ENCOUNTER — Inpatient Hospital Stay (HOSPITAL_COMMUNITY): Payer: Medicare Other

## 2013-08-04 DIAGNOSIS — R079 Chest pain, unspecified: Secondary | ICD-10-CM

## 2013-08-04 LAB — CBC WITH DIFFERENTIAL/PLATELET
Basophils Absolute: 0.1 10*3/uL (ref 0.0–0.1)
Basophils Relative: 1 % (ref 0–1)
Eosinophils Absolute: 0.1 10*3/uL (ref 0.0–0.7)
Hemoglobin: 6.8 g/dL — CL (ref 12.0–15.0)
MCH: 35.4 pg — ABNORMAL HIGH (ref 26.0–34.0)
MCHC: 35.1 g/dL (ref 30.0–36.0)
MCV: 101 fL — ABNORMAL HIGH (ref 78.0–100.0)
Monocytes Relative: 9 % (ref 3–12)
Neutrophils Relative %: 59 % (ref 43–77)
Platelets: 264 10*3/uL (ref 150–400)
RDW: 28.4 % — ABNORMAL HIGH (ref 11.5–15.5)

## 2013-08-04 LAB — COMPREHENSIVE METABOLIC PANEL
ALT: 31 U/L (ref 0–35)
AST: 84 U/L — ABNORMAL HIGH (ref 0–37)
Albumin: 2.5 g/dL — ABNORMAL LOW (ref 3.5–5.2)
Alkaline Phosphatase: 173 U/L — ABNORMAL HIGH (ref 39–117)
CO2: 26 mEq/L (ref 19–32)
Calcium: 8.8 mg/dL (ref 8.4–10.5)
Chloride: 104 mEq/L (ref 96–112)
GFR calc Af Amer: >90 mL/min (ref >90)
GFR calc non Af Amer: >90 mL/min (ref >90)
Glucose, Bld: 96 mg/dL (ref 70–99)
Sodium: 138 mEq/L (ref 135–145)
Total Bilirubin: 5.5 mg/dL — ABNORMAL HIGH (ref 0.3–1.2)

## 2013-08-04 LAB — RETICULOCYTES: Retic Ct Pct: 26.6 % — ABNORMAL HIGH (ref 0.4–3.1)

## 2013-08-04 LAB — PREPARE RBC (CROSSMATCH)

## 2013-08-04 MED ORDER — HYDROMORPHONE 0.3 MG/ML IV SOLN
INTRAVENOUS | Status: DC
  Administered 2013-08-04 – 2013-08-05 (×5): via INTRAVENOUS
  Filled 2013-08-04 (×5): qty 25

## 2013-08-04 MED ORDER — SODIUM CHLORIDE 0.9 % IJ SOLN
10.0000 mL | Freq: Two times a day (BID) | INTRAMUSCULAR | Status: DC
  Administered 2013-08-04 – 2013-08-10 (×9): 10 mL

## 2013-08-04 MED ORDER — SODIUM CHLORIDE 0.9 % IJ SOLN: 10.0000 mL | INTRAMUSCULAR | Status: DC | PRN

## 2013-08-04 MED ORDER — SALINE SPRAY 0.65 % NA SOLN
1.0000 | NASAL | Status: DC | PRN
  Filled 2013-08-04: qty 44

## 2013-08-04 NOTE — Progress Notes (Signed)
Patient seen and examined. Agree with note as above.  She continues to have chest pain.  PCA was adjusted this morning with dose increases.  She reports some improvement with this.  Her hemoglobin has trended down and she requires a transfusion of prbc.  Venous access has been an issue and a midline has been placed by PICC team.  This will have to be discussed with patients' pcp as well as patient whether it would be beneficial to continue this as an outpatient, especially in light of recent fungemia.  She did have some epistaxis due to drying of nasal mucosa from oxygen.  This has since resolved.  MEMON,JEHANZEB

## 2013-08-04 NOTE — Progress Notes (Signed)
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 Adjustments were made to PCA dosing after discussion with patient's primary care doctor [name]. Continue analgesics via PCA. Patient reports little relief in pain today in spite of adjustments to PCA. Repeat chest xray with improved left basilar aeration with persistent subsegmental atelectasis or scarring. Antibiotics changed to po 08/03/13. Will adjust PCA again for better pain control. Will transfuse 1 unit PRBC's. for HG 6.8. Continue Toradol for adjunct therapy. Cbc in am  1. Fever: Likely related to fungemia. Resolved. Micafungin discontinued 08/02/13. Urinalysis was equivocal, Repeat chest x-ray was negative. She remains non toxic and non hypoxic. Her sternal pain is consistent with her at usual crisis pain. No indication of acute chest syndrome.  2. SSA : Continue Exjade, Folic acid, hydroxyurea.  3. Fungemia: likely from porta cath.Porta cath removed 07/29/13. repeat blood cultures have shown no growth. Discussed cultures and course of treatment with Dr. [name], Infectious Disease 08/02/13. Recommendations were to discontinue Micafungin and start patient on Fluconazole 400mg  po daily for a total of 2 weeks of therapy from the first set of negative blood cultures. Fluconazole will need to be completed until 08/10/13. 2D echo does not show any vegetations. 4. Tachycardia: related to #1. Will provide gentle IV fluids, adjust PCA and transfuse 1 unit PRBC. Continue to monitor on tele.     Code Status: full Family Communication: friend at bedside Disposition Plan: home when ready   Consultants:  none  Procedures:  PICC insertion  Antibiotics: Micofungin 07/27/13>>08/02/13  Fluconazole 08/02/13 >>>   HPI/Subjective: Sitting up  in bed watching TV and visiting with friend  Objective: Filed Vitals:   08/04/13 1014  BP: 115/75  Pulse: 111  Temp: 98.8 F (37.1 C)  Resp: 17    Intake/Output Summary (Last 24 hours) at 08/04/13 1314 Last data filed at 08/04/13 0900  Gross per 24 hour  Intake    440 ml  Output      0 ml  Net    440 ml   Filed Weights   07/28/13 0500 07/29/13 0807 08/01/13 0623  Weight: 69.627 kg (153 lb 8 oz) 69.4 kg (153 lb) 71.623 kg (157 lb 14.4 oz)    Exam:   General:  Well nourished NAD  Cardiovascular: tachycardic but regular. No MGR No LEE  Respiratory: normal effort Somewhat shallow. BS slightly distant. No wheeze  Abdomen: soft +BS non-tender to palpation  Musculoskeletal: no clubbing no cyanosis   Data Reviewed: Basic Metabolic Panel:  Recent Labs Lab 07/29/13 0446 07/30/13 0506 07/31/13 0946 08/03/13 0533 08/04/13 0511  NA 136 138 135 140 138  K 4.0 4.0 4.0 4.3 4.0  CL 102 104 99 105 104  CO2 26 26 27 28 26   GLUCOSE 90 104* 106* 95 96  BUN 4* 3* 4* 7 8  CREATININE 0.55 0.51 0.54 0.64 0.67  CALCIUM 8.6 8.7 8.9 8.9 8.8   Liver Function Tests:  Recent Labs Lab 07/31/13 0946 08/04/13 0511  AST 68* 84*  ALT 22 31  ALKPHOS 161* 173*  BILITOT 5.6* 5.5*  PROT 7.3 7.1  ALBUMIN 2.7* 2.5*   No results found for this basename: LIPASE, AMYLASE,  in the last 168 hours No results found for this basename: AMMONIA,  in the last 168 hours CBC:  Recent Labs Lab 07/30/13  1191 07/31/13 0512 08/01/13 0653 08/03/13 0533 08/04/13 0511  WBC 10.5 9.7 9.9 8.9 11.9*  NEUTROABS  --   --   --   --  7.5  HGB 7.4* 7.8* 8.2* 7.5* 6.8*  HCT 20.9* 21.3* 23.6* 21.7* 19.4*  MCV 95.4 95.5 97.5 101.4* 101.0*  PLT 201 200 250 236 264   Cardiac Enzymes: No results found for this basename: CKTOTAL, CKMB, CKMBINDEX, TROPONINI,  in the last 168 hours BNP (last 3 results) No results found for this basename: PROBNP,  in the last 8760 hours CBG: No results found for this  basename: GLUCAP,  in the last 168 hours  Recent Results (from the past 240 hour(s))  URINE CULTURE     Status: None   Collection Time    07/25/13  4:34 PM      Result Value Range Status   Specimen Description URINE, CLEAN CATCH   Final   Special Requests NONE   Final   Culture  Setup Time     Final   Value: 07/25/2013 23:57     Performed at [facility] Count     Final   Value: NO GROWTH     Performed at [facility]   Culture     Final   Value: NO GROWTH     Performed at [facility]   Report Status 07/27/2013 FINAL   Final  CULTURE, BLOOD (ROUTINE X 2)     Status: None   Collection Time    07/26/13  6:41 PM      Result Value Range Status   Specimen Description BLOOD LEFT PORTA CATH DRAWN BY RN LM   Final   Special Requests BOTTLES DRAWN AEROBIC AND ANAEROBIC [facility] EACH   Final   Culture  Setup Time     Final   Value: 07/28/2013 01:13     Performed at [facility]   Culture     Final   Value: CANDIDA ALBICANS     Note: Gram Stain Report Called to,Read Back By and Verified With: ROGERS A AT 1234 ON 07/27/13 BY GAUGHAM M.     Performed at [facility]   Report Status 07/31/2013 FINAL   Final  CULTURE, BLOOD (ROUTINE X 2)     Status: None   Collection Time    07/26/13  6:46 PM      Result Value Range Status   Specimen Description BLOOD RIGHT HAND   Final   Special Requests BOTTLES DRAWN AEROBIC AND ANAEROBIC [facility] EACH   Final   Culture  Setup Time     Final   Value: 07/28/2013 19:03     Performed at [facility]   Culture     Final   Value: CANDIDA ALBICANS     Note: Gram Stain Report Called to,Read Back By and Verified With: ROGERSN A AT 1234 ON 07/27/2013 BY BAUGHAM     Performed at [facility]   Report Status 08/01/2013 FINAL   Final  CULTURE, BLOOD (ROUTINE X 2)     Status: None   Collection Time    07/27/13  7:15 PM      Result Value Range Status   Specimen Description BLOOD RIGHT HAND   Final    Special Requests BOTTLES DRAWN AEROBIC ONLY 6CC   Final   Culture NO GROWTH 5 DAYS   Final   Report Status 08/01/2013 FINAL   Final  CULTURE, BLOOD (ROUTINE X  2)     Status: None   Collection Time    07/27/13  7:20 PM      Result Value Range Status   Specimen Description BLOOD RIGHT ANTECUBITAL   Final   Special Requests BOTTLES DRAWN AEROBIC AND ANAEROBIC 12CC EACH   Final   Culture NO GROWTH 5 DAYS   Final   Report Status 08/01/2013 FINAL   Final  SURGICAL PCR SCREEN     Status: None   Collection Time    07/28/13 11:16 AM      Result Value Range Status   MRSA, PCR NEGATIVE  NEGATIVE Final   Staphylococcus aureus NEGATIVE  NEGATIVE Final   Comment:            The Xpert SA Assay (FDA     approved for NASAL specimens     in patients over 51 years of age),     is one component of     a comprehensive surveillance     program.  Test performance has     been validated by [employer] for patients greater     than or equal to 47 year old.     It is not intended     to diagnose infection nor to     guide or monitor treatment.  CATH TIP CULTURE     Status: None   Collection Time    07/29/13 10:10 AM      Result Value Range Status   Specimen Description CATH TIP LEFT PORTA CATH   Final   Special Requests NONE   Final   Culture     Final   Value: 15 COLONIES CANDIDA ALBICANS     Performed at [facility]   Report Status 08/01/2013 FINAL   Final  CULTURE, BLOOD (ROUTINE X 2)     Status: None   Collection Time    07/31/13  9:53 AM      Result Value Range Status   Specimen Description Blood RIGHT ARM   Final   Special Requests BOTTLES DRAWN AEROBIC AND ANAEROBIC 8 CC EACH   Final   Culture NO GROWTH 4 DAYS   Final   Report Status PENDING   Incomplete  CULTURE, BLOOD (ROUTINE X 2)     Status: None   Collection Time    07/31/13  9:53 AM      Result Value Range Status   Specimen Description Blood RIGHT HAND   Final   Special Requests BOTTLES DRAWN AEROBIC AND ANAEROBIC  8CC EACH   Final   Culture NO GROWTH 4 DAYS   Final   Report Status PENDING   Incomplete     Studies: Dg Chest Port 1 View  08/03/2013   CLINICAL DATA:  Chest pain and shortness of breath. History of sickle cell disease.  EXAM: PORTABLE CHEST - 1 VIEW  COMPARISON:  07/27/2013  FINDINGS: Avascular necrosis of the humeral heads. Midline trachea. Mild cardiomegaly, accentuated by a AP portable technique. No pleural effusion or pneumothorax. No congestive failure. Improved left base aeration with minimal scarring or subsegmental atelectasis remaining. Otherwise, no lobar consolidation.  IMPRESSION: Cardiomegaly without congestive failure.  Improved left basilar aeration with persistent subsegmental atelectasis or scarring.  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   Electronically Signed   By: Jeronimo Greaves M.D.   On: 08/03/2013 16:35    Scheduled Meds: . deferasirox  1,500 mg Oral QAC breakfast  . fluconazole  400  mg Oral Q24H  . folic acid  1 mg Oral Daily  . hydroxyurea  1,000 mg Oral QODAY   And  . hydroxyurea  500 mg Oral QODAY  . ketorolac  30 mg Intravenous Q6H  . loratadine  10 mg Oral Daily  . pantoprazole  40 mg Oral Daily  . polyethylene glycol  17 g Oral Daily  . senna-docusate  1 tablet Oral BID   Continuous Infusions: . sodium chloride 50 mL/hr at 08/03/13 1839  . dextrose 5 % and 0.45 % NaCl with KCl 20 mEq/L 20 mL/hr (08/03/13 0615)  . HYDROmorphone PCA 0.3 mg/mL      Active Problems:   Sickle cell anemia   Leukocytosis   HTN (hypertension)   Fever, unspecified   Fungemia   Chest pain  Time spent: 35 minutes    [facility] M  [facility] Pager [phone]. If 7PM-7AM, please contact night-coverage at [email or web address], password [facility] 08/04/2013, 1:14 PM  LOS: 10 days

## 2013-08-04 NOTE — Plan of Care (Signed)
Paged Dr. Kerry Hough with critical hemo 6.8 - waiting on orders.

## 2013-08-04 NOTE — Progress Notes (Signed)
POD # 6  Wound clean and dry without evidence of infection. Sutures removed and wound steri stripped and redressed.  Pt can clean this with alcohol daily.  No need for follow up with me. Mercifully,  one less doctor in her life.    Thanks for consult.

## 2013-08-04 NOTE — Progress Notes (Signed)
Discussed with latoya the patient nurse and dr Rennis Chris that unable to insert picc line due to scar tissue and infection. Did cut line to midline so patient could receive her transfusion.more information in [place].

## 2013-08-05 ENCOUNTER — Inpatient Hospital Stay (HOSPITAL_COMMUNITY): Payer: Medicare Other

## 2013-08-05 LAB — CBC
Hemoglobin: 7.7 g/dL — ABNORMAL LOW (ref 12.0–15.0)
MCH: 34.4 pg — ABNORMAL HIGH (ref 26.0–34.0)
MCV: 97.3 fL (ref 78.0–100.0)
Platelets: 271 10*3/uL (ref 150–400)
RDW: 26.7 % — ABNORMAL HIGH (ref 11.5–15.5)
WBC: 8.7 10*3/uL (ref 4.0–10.5)

## 2013-08-05 LAB — CULTURE, BLOOD (ROUTINE X 2)
Culture: NO GROWTH
Culture: NO GROWTH

## 2013-08-05 LAB — BASIC METABOLIC PANEL
CO2: 26 mEq/L (ref 19–32)
Calcium: 8.9 mg/dL (ref 8.4–10.5)
Chloride: 106 mEq/L (ref 96–112)
Creatinine, Ser: 0.63 mg/dL (ref 0.50–1.10)
GFR calc Af Amer: >90 mL/min (ref >90)
Potassium: 4.1 mEq/L (ref 3.5–5.1)
Sodium: 140 mEq/L (ref 135–145)

## 2013-08-05 LAB — D-DIMER, QUANTITATIVE: D-Dimer, Quant: 3.28 ug/mL-FEU — ABNORMAL HIGH (ref 0.00–0.48)

## 2013-08-05 MED ORDER — IOHEXOL 350 MG/ML SOLN
100.0000 mL | Freq: Once | INTRAVENOUS | Status: AC | PRN
  Administered 2013-08-05: 100 mL via INTRAVENOUS

## 2013-08-05 MED ORDER — HYDROMORPHONE 0.3 MG/ML IV SOLN
INTRAVENOUS | Status: DC
  Administered 2013-08-05 – 2013-08-07 (×7): via INTRAVENOUS
  Administered 2013-08-07: 6.29 mg via INTRAVENOUS
  Administered 2013-08-07: 1.95 mg via INTRAVENOUS
  Filled 2013-08-05 (×8): qty 25

## 2013-08-05 NOTE — Progress Notes (Signed)
[facility] PROGRESS NOTE  DAJE STARK ZOX:[id number] DOB: 03-11-1981 DOA: 07/25/2013 PCP: [name],[name], MD  Assessment/Plan: Sickle cell anemia with acute pain crisis, hemolysis: Patient started on PCA dilaudid on 11/7 for better pain control. Adjustments were made to PCA dosing after discussion with patient's primary care doctor [name] and again on 08/05/13. Continue analgesics via PCA. Patient reports only a little relief in pain today. Repeat chest xray 08/04/13 with improved left basilar aeration with persistent subsegmental atelectasis or scarring. Antibiotics changed to po 08/03/13. Will check d-dimer to evaluate for PE given slow improvement and persistent right sided chest pain.  S/P1 unit PRBC's on 08/04/13 for HG 6.8.Hemoglobin is 7.7 this am. Will consider adding Ms Contin for short term to improve pain control and help patient wean off PCA. Continue Toradol for adjunct therapy. Cbc in am  1. Fever: Likely related to fungemia. Resolved. Micafungin discontinued 08/02/13 and diflucan started. Urinalysis was equivocal, Repeat chest x-ray was negative. She remains non toxic and non hypoxic. Her sternal pain is consistent with her at usual crisis pain. No indication of acute chest syndrome.  2. SSA : Continue Exjade, Folic acid, hydroxyurea.  3. Fungemia: likely from porta cath.Porta cath removed 07/29/13. repeat blood cultures have shown no growth. Discussed cultures and course of treatment with Dr. [name], Infectious Disease 08/02/13. Recommendations were to discontinue Micafungin and start patient on Fluconazole 400mg  po daily for a total of 2 weeks of therapy from the first set of negative blood cultures. Fluconazole will need to be completed until 08/10/13. 2D echo does not show any vegetations. Tachycardia: related to #1. Continue gentle IV fluids, check d-dimer. Continue to monitor on tele.     Code Status: full Family Communication: none present Disposition Plan: home  when ready   Consultants:  none  Procedures:  Porta cath removal   Antibiotics:  Micofungin 07/27/13>>08/02/13  Fluconazole 08/02/13 >>08/10/13  HPI/Subjective: Sitting on side of bed. Reports only slight improvement in chest pain. No sob.   Objective: Filed Vitals:   08/05/13 1023  BP:   Pulse:   Temp:   Resp: 20    Intake/Output Summary (Last 24 hours) at 08/05/13 1145 Last data filed at 08/05/13 0900  Gross per 24 hour  Intake    970 ml  Output      0 ml  Net    970 ml   Filed Weights   07/29/13 0807 08/01/13 0623 08/05/13 0531  Weight: 69.4 kg (153 lb) 71.623 kg (157 lb 14.4 oz) 72.3 kg (159 lb 6.3 oz)    Exam:   General:  Well nourished NAD  Cardiovascular: tachycardic but regular no MGR No LE edema  Respiratory: normal effort BS clear bilaterally no wheeze no rhonchi  Abdomen: soft +BS non-tender to palpations  Musculoskeletal: no clubbing or cyanosis. Mild swelling right knee. Tender to touch.    Data Reviewed: Basic Metabolic Panel:  Recent Labs Lab 07/30/13 0506 07/31/13 0946 08/03/13 0533 08/04/13 0511 08/05/13 0521  NA 138 135 140 138 140  K 4.0 4.0 4.3 4.0 4.1  CL 104 99 105 104 106  CO2 26 27 28 26 26   GLUCOSE 104* 106* 95 96 92  BUN 3* 4* 7 8 7   CREATININE 0.51 0.54 0.64 0.67 0.63  CALCIUM 8.7 8.9 8.9 8.8 8.9   Liver Function Tests:  Recent Labs Lab 07/31/13 0946 08/04/13 0511  AST 68* 84*  ALT 22 31  ALKPHOS 161* 173*  BILITOT 5.6* 5.5*  PROT 7.3  7.1  ALBUMIN 2.7* 2.5*   No results found for this basename: LIPASE, AMYLASE,  in the last 168 hours No results found for this basename: AMMONIA,  in the last 168 hours CBC:  Recent Labs Lab 07/31/13 0512 08/01/13 0653 08/03/13 0533 08/04/13 0511 08/05/13 0521  WBC 9.7 9.9 8.9 11.9* 8.7  NEUTROABS  --   --   --  7.5  --   HGB 7.8* 8.2* 7.5* 6.8* 7.7*  HCT 21.3* 23.6* 21.7* 19.4* 21.8*  MCV 95.5 97.5 101.4* 101.0* 97.3  PLT 200 250 236 264 271   Cardiac  Enzymes: No results found for this basename: CKTOTAL, CKMB, CKMBINDEX, TROPONINI,  in the last 168 hours BNP (last 3 results) No results found for this basename: PROBNP,  in the last 8760 hours CBG: No results found for this basename: GLUCAP,  in the last 168 hours  Recent Results (from the past 240 hour(s))  CULTURE, BLOOD (ROUTINE X 2)     Status: None   Collection Time    07/26/13  6:41 PM      Result Value Range Status   Specimen Description BLOOD LEFT PORTA CATH DRAWN BY RN LM   Final   Special Requests BOTTLES DRAWN AEROBIC AND ANAEROBIC [facility] EACH   Final   Culture  Setup Time     Final   Value: 07/28/2013 01:13     Performed at [facility]   Culture     Final   Value: CANDIDA ALBICANS     Note: Gram Stain Report Called to,Read Back By and Verified With: ROGERS A AT 1234 ON 07/27/13 BY GAUGHAM M.     Performed at [facility]   Report Status 07/31/2013 FINAL   Final  CULTURE, BLOOD (ROUTINE X 2)     Status: None   Collection Time    07/26/13  6:46 PM      Result Value Range Status   Specimen Description BLOOD RIGHT HAND   Final   Special Requests BOTTLES DRAWN AEROBIC AND ANAEROBIC [facility] EACH   Final   Culture  Setup Time     Final   Value: 07/28/2013 19:03     Performed at [facility]   Culture     Final   Value: CANDIDA ALBICANS     Note: Gram Stain Report Called to,Read Back By and Verified With: ROGERSN A AT 1234 ON 07/27/2013 BY BAUGHAM     Performed at [facility]   Report Status 08/01/2013 FINAL   Final  CULTURE, BLOOD (ROUTINE X 2)     Status: None   Collection Time    07/27/13  7:15 PM      Result Value Range Status   Specimen Description BLOOD RIGHT HAND   Final   Special Requests BOTTLES DRAWN AEROBIC ONLY 6CC   Final   Culture NO GROWTH 5 DAYS   Final   Report Status 08/01/2013 FINAL   Final  CULTURE, BLOOD (ROUTINE X 2)     Status: None   Collection Time    07/27/13  7:20 PM      Result Value Range Status   Specimen  Description BLOOD RIGHT ANTECUBITAL   Final   Special Requests BOTTLES DRAWN AEROBIC AND ANAEROBIC 12CC EACH   Final   Culture NO GROWTH 5 DAYS   Final   Report Status 08/01/2013 FINAL   Final  SURGICAL PCR SCREEN     Status: None   Collection Time  07/28/13 11:16 AM      Result Value Range Status   MRSA, PCR NEGATIVE  NEGATIVE Final   Staphylococcus aureus NEGATIVE  NEGATIVE Final   Comment:            The Xpert SA Assay (FDA     approved for NASAL specimens     in patients over 46 years of age),     is one component of     a comprehensive surveillance     program.  Test performance has     been validated by [employer] for patients greater     than or equal to 63 year old.     It is not intended     to diagnose infection nor to     guide or monitor treatment.  CATH TIP CULTURE     Status: None   Collection Time    07/29/13 10:10 AM      Result Value Range Status   Specimen Description CATH TIP LEFT PORTA CATH   Final   Special Requests NONE   Final   Culture     Final   Value: 15 COLONIES CANDIDA ALBICANS     Performed at [facility]   Report Status 08/01/2013 FINAL   Final  CULTURE, BLOOD (ROUTINE X 2)     Status: None   Collection Time    07/31/13  9:53 AM      Result Value Range Status   Specimen Description Blood RIGHT ARM   Final   Special Requests BOTTLES DRAWN AEROBIC AND ANAEROBIC 8 CC EACH   Final   Culture NO GROWTH 4 DAYS   Final   Report Status PENDING   Incomplete  CULTURE, BLOOD (ROUTINE X 2)     Status: None   Collection Time    07/31/13  9:53 AM      Result Value Range Status   Specimen Description Blood RIGHT HAND   Final   Special Requests BOTTLES DRAWN AEROBIC AND ANAEROBIC 8CC EACH   Final   Culture NO GROWTH 4 DAYS   Final   Report Status PENDING   Incomplete     Studies: Dg Chest Port 1 View  08/03/2013   CLINICAL DATA:  Chest pain and shortness of breath. History of sickle cell disease.  EXAM: PORTABLE CHEST - 1 VIEW   COMPARISON:  07/27/2013  FINDINGS: Avascular necrosis of the humeral heads. Midline trachea. Mild cardiomegaly, accentuated by a AP portable technique. No pleural effusion or pneumothorax. No congestive failure. Improved left base aeration with minimal scarring or subsegmental atelectasis remaining. Otherwise, no lobar consolidation.  IMPRESSION: Cardiomegaly without congestive failure.  Improved left basilar aeration with persistent subsegmental atelectasis or scarring.  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   Electronically Signed   By: Jeronimo Greaves M.D.   On: 08/03/2013 16:35    Scheduled Meds: . deferasirox  1,500 mg Oral QAC breakfast  . fluconazole  400 mg Oral Q24H  . folic acid  1 mg Oral Daily  . hydroxyurea  1,000 mg Oral QODAY   And  . hydroxyurea  500 mg Oral QODAY  . ketorolac  30 mg Intravenous Q6H  . loratadine  10 mg Oral Daily  . pantoprazole  40 mg Oral Daily  . polyethylene glycol  17 g Oral Daily  . senna-docusate  1 tablet Oral BID  . sodium chloride  10-40 mL Intracatheter Q12H   Continuous Infusions: . sodium  chloride 50 mL/hr at 08/05/13 1003  . dextrose 5 % and 0.45 % NaCl with KCl 20 mEq/L 20 mL/hr (08/03/13 0615)  . HYDROmorphone PCA 0.3 mg/mL      Active Problems:   Sickle cell anemia   Leukocytosis   HTN (hypertension)   Fever, unspecified   Fungemia   Chest pain    Time spent: 35 minutes    [facility] M  [facility] Pager [phone]. If 7PM-7AM, please contact night-coverage at [email or web address], password [facility] 08/05/2013, 11:45 AM  LOS: 11 days   Attending note:  Patient seen and examined.  Agree with note as above.  Patient is having ongoing chest pain that is likely related to her sickle cell crisis.  She is continued on pain meds through PCA.  Chest xray was unremarkable, so will check D dimer to rule out PE since pain has been ongoing.  Care has been discussed with patient's primary doctor [name] on several occasions  to help with pain management.  Recommendations are to start to wean off PCA, add lose dose MS contin 15mg  BID tomorrow to assist with weaning.    Patient had infected portacath with fungemia that has since been removed and fungemia has cleared.  She is now on fluconazole until 11/19.  She has a midline catheter for IV access, which can stay in place post discharge.  Arrangements will be made for interventional radiology place another port a cath as an outpatient.  MEMON,JEHANZEB

## 2013-08-06 LAB — BASIC METABOLIC PANEL
Creatinine, Ser: 0.64 mg/dL (ref 0.50–1.10)
GFR calc Af Amer: >90 mL/min (ref >90)
GFR calc non Af Amer: >90 mL/min (ref >90)
Potassium: 3.9 mEq/L (ref 3.5–5.1)
Sodium: 138 mEq/L (ref 135–145)

## 2013-08-06 LAB — CBC
MCHC: 35.1 g/dL (ref 30.0–36.0)
Platelets: 297 10*3/uL (ref 150–400)
RBC: 2.28 MIL/uL — ABNORMAL LOW (ref 3.87–5.11)
RDW: 27.3 % — ABNORMAL HIGH (ref 11.5–15.5)

## 2013-08-06 MED ORDER — MORPHINE SULFATE ER 15 MG PO TBCR
15.0000 mg | EXTENDED_RELEASE_TABLET | Freq: Two times a day (BID) | ORAL | Status: DC
  Administered 2013-08-06 – 2013-08-10 (×9): 15 mg via ORAL
  Filled 2013-08-06 (×9): qty 1

## 2013-08-06 NOTE — Progress Notes (Signed)
[facility] PROGRESS NOTE  Dawn [name] JXB:[id number] DOB: 1981-06-11 DOA: 07/25/2013 PCP: [name],[name], MD  Assessment/Plan: Sickle cell anemia with acute pain crisis, hemolysis: Patient started on PCA dilaudid on 11/7 for better pain control. Adjustments were made to PCA dosing after discussion with patient's primary care doctor [name] on 2 separate occasions. PCA weaning started yesterday. Patient reports some worsening in pain today. Will start MS Contin 15mg  BID to assist with weaning. Discussed weaning with [name] who recommended evaluating night dose and start wean tomorrow.  CT chest yields no PE but multiple new bilateral nodular opacities measuring up to 11 mm,  likely infectious/inflammatory Antibiotics changed to po 08/03/13.  S/P transfusipn 1 unit PRBC's for HG 6.8 . Today Hg 7.8. Continue Toradol for adjunct therapy. Cbc in am  1. Fever: Likely related to fungemia. Resolved. Micafungin discontinued 08/02/13. Urinalysis was equivocal, Repeat chest x-ray was negative. She remains non toxic and non hypoxic. Her sternal pain is consistent with her at usual crisis pain. No indication of acute chest syndrome.  2. SSA : Continue Exjade, Folic acid, hydroxyurea.  3. Fungemia: likely from porta cath.Porta cath removed 07/29/13. repeat blood cultures have shown no growth. Discussed cultures and course of treatment with Dr. [name], Infectious Disease 08/02/13. Recommendations were to discontinue Micafungin and start patient on Fluconazole 400mg  po daily for a total of 2 weeks of therapy from the first set of negative blood cultures. Fluconazole will need to be completed until 08/10/13. 2D echo does not show any vegetations. 4. Tachycardia: Mild and improving, related to #1. Will provide gentle IV fluids. Continue to monitor on tele.   Code Status: full Family Communication: family at bedside Disposition Plan: home when ready   Consultants:  none  Procedures:  Porta  cath removal   Antibiotics: Micofungin 07/27/13>>08/02/13  Fluconazole 08/02/13 >>>   HPI/Subjective: Ambulating in room. Gait slow but steady. Reports worsening pain this am.   Objective: Filed Vitals:   08/06/13 1005  BP:   Pulse:   Temp:   Resp: 20    Intake/Output Summary (Last 24 hours) at 08/06/13 1015 Last data filed at 08/06/13 0900  Gross per 24 hour  Intake 3525.83 ml  Output      0 ml  Net 3525.83 ml   Filed Weights   08/01/13 0623 08/05/13 0531 08/06/13 0500  Weight: 71.623 kg (157 lb 14.4 oz) 72.3 kg (159 lb 6.3 oz) 72.5 kg (159 lb 13.3 oz)    Exam:   General:  Well nourished somewhat uncomfortable appearing  Cardiovascular: RRR No m/g/r No LE edema . PPP  Respiratory: normal effort slightly shallow. BS distant but clear bilaterally to ausculation. No wheeze no rhonchi  Abdomen: obese soft +BS non-tender to palpation  Musculoskeletal: no clubbing no cyanosis   Data Reviewed: Basic Metabolic Panel:  Recent Labs Lab 07/31/13 0946 08/03/13 0533 08/04/13 0511 08/05/13 0521 08/06/13 0445  NA 135 140 138 140 138  K 4.0 4.3 4.0 4.1 3.9  CL 99 105 104 106 103  CO2 27 28 26 26 26   GLUCOSE 106* 95 96 92 88  BUN 4* 7 8 7 6   CREATININE 0.54 0.64 0.67 0.63 0.64  CALCIUM 8.9 8.9 8.8 8.9 8.8   Liver Function Tests:  Recent Labs Lab 07/31/13 0946 08/04/13 0511  AST 68* 84*  ALT 22 31  ALKPHOS 161* 173*  BILITOT 5.6* 5.5*  PROT 7.3 7.1  ALBUMIN 2.7* 2.5*   No results found for this basename:  LIPASE, AMYLASE,  in the last 168 hours No results found for this basename: AMMONIA,  in the last 168 hours CBC:  Recent Labs Lab 08/01/13 0653 08/03/13 0533 08/04/13 0511 08/05/13 0521 08/06/13 0445  WBC 9.9 8.9 11.9* 8.7 8.2  NEUTROABS  --   --  7.5  --   --   HGB 8.2* 7.5* 6.8* 7.7* 7.8*  HCT 23.6* 21.7* 19.4* 21.8* 22.2*  MCV 97.5 101.4* 101.0* 97.3 97.4  PLT 250 236 264 271 297   Cardiac Enzymes: No results found for this basename:  CKTOTAL, CKMB, CKMBINDEX, TROPONINI,  in the last 168 hours BNP (last 3 results) No results found for this basename: PROBNP,  in the last 8760 hours CBG: No results found for this basename: GLUCAP,  in the last 168 hours  Recent Results (from the past 240 hour(s))  CULTURE, BLOOD (ROUTINE X 2)     Status: None   Collection Time    07/27/13  7:15 PM      Result Value Range Status   Specimen Description BLOOD RIGHT HAND   Final   Special Requests BOTTLES DRAWN AEROBIC ONLY 6CC   Final   Culture NO GROWTH 5 DAYS   Final   Report Status 08/01/2013 FINAL   Final  CULTURE, BLOOD (ROUTINE X 2)     Status: None   Collection Time    07/27/13  7:20 PM      Result Value Range Status   Specimen Description BLOOD RIGHT ANTECUBITAL   Final   Special Requests BOTTLES DRAWN AEROBIC AND ANAEROBIC 12CC EACH   Final   Culture NO GROWTH 5 DAYS   Final   Report Status 08/01/2013 FINAL   Final  SURGICAL PCR SCREEN     Status: None   Collection Time    07/28/13 11:16 AM      Result Value Range Status   MRSA, PCR NEGATIVE  NEGATIVE Final   Staphylococcus aureus NEGATIVE  NEGATIVE Final   Comment:            The Xpert SA Assay (FDA     approved for NASAL specimens     in patients over 44 years of age),     is one component of     a comprehensive surveillance     program.  Test performance has     been validated by [employer] for patients greater     than or equal to 19 year old.     It is not intended     to diagnose infection nor to     guide or monitor treatment.  CATH TIP CULTURE     Status: None   Collection Time    07/29/13 10:10 AM      Result Value Range Status   Specimen Description CATH TIP LEFT PORTA CATH   Final   Special Requests NONE   Final   Culture     Final   Value: 15 COLONIES CANDIDA ALBICANS     Performed at [facility]   Report Status 08/01/2013 FINAL   Final  CULTURE, BLOOD (ROUTINE X 2)     Status: None   Collection Time    07/31/13  9:53 AM       Result Value Range Status   Specimen Description Blood RIGHT ARM   Final   Special Requests BOTTLES DRAWN AEROBIC AND ANAEROBIC 8 CC EACH   Final   Culture NO GROWTH 5 DAYS  Final   Report Status 08/05/2013 FINAL   Final  CULTURE, BLOOD (ROUTINE X 2)     Status: None   Collection Time    07/31/13  9:53 AM      Result Value Range Status   Specimen Description Blood RIGHT HAND   Final   Special Requests BOTTLES DRAWN AEROBIC AND ANAEROBIC 8CC EACH   Final   Culture NO GROWTH 5 DAYS   Final   Report Status 08/05/2013 FINAL   Final     Studies: Ct Angio Chest Pe W/cm &/or Wo Cm  08/05/2013   CLINICAL DATA:  Chest pain  EXAM: CT ANGIOGRAPHY CHEST WITH CONTRAST  TECHNIQUE: Multidetector CT imaging of the chest was performed using the standard protocol during bolus administration of intravenous contrast. Multiplanar CT image reconstructions including MIPs were obtained to evaluate the vascular anatomy.  CONTRAST:  OMNIPAQUE IOHEXOL 350 MG/ML SOLN  COMPARISON:  Chest radiograph dated 08/03/2013. CTA chest dated 05/14/2013.  FINDINGS: No evidence of pulmonary embolism.  8 x 11 mm patchy/nodular opacity in the medial left upper lobe (series 5/ image 31), new.  Additional new bilateral pulmonary nodules, likely infectious/inflammatory, including:  --8 mm nodule in the right lung apex (series 5/ image 18)  --6 mm nodule in the lingula (series 5/ image 43)  --7 mm nodule in the left lower lobe (series 5/ image 45)  --8 mm lingular nodule (series 5/ image 53)  --5 mm right lower lobe nodule (series 5/image 54)  Linear scarring in the lateral right upper lobe and bilateral lower lobes. No pleural effusion or pneumothorax.  Visualized thyroid is unremarkable.  Heart is mildly enlarged. Suspected fluid in the inferior pericardial recess/subcarinal region, grossly unchanged. No suspicious hilar or axillary lymphadenopathy.  Visualized upper abdomen is notable for a mildly nodular hepatic contour, splenic  autoinfarction, and probable right upper pole renal scarring.  Vertebral sclerosis with endplate changes characteristic of sickle cell disease.  Review of the MIP images confirms the above findings.  IMPRESSION: No evidence of pulmonary embolism.  Multiple new bilateral nodular opacities measuring up to 11 mm, as described above, likely infectious/inflammatory.  Additional stable changes related to known sickle cell disease.   Electronically Signed   By: Charline Bills M.D.   On: 08/05/2013 16:46    Scheduled Meds: . deferasirox  1,500 mg Oral QAC breakfast  . fluconazole  400 mg Oral Q24H  . folic acid  1 mg Oral Daily  . HYDROmorphone PCA 0.3 mg/mL   Intravenous Q4H  . hydroxyurea  1,000 mg Oral QODAY   And  . hydroxyurea  500 mg Oral QODAY  . loratadine  10 mg Oral Daily  . morphine  15 mg Oral Q12H  . pantoprazole  40 mg Oral Daily  . polyethylene glycol  17 g Oral Daily  . senna-docusate  1 tablet Oral BID  . sodium chloride  10-40 mL Intracatheter Q12H   Continuous Infusions: . sodium chloride 50 mL/hr at 08/06/13 0510  . dextrose 5 % and 0.45 % NaCl with KCl 20 mEq/L 20 mL/hr (08/03/13 0615)    Active Problems:   Sickle cell anemia   Leukocytosis   HTN (hypertension)   Fever, unspecified   Fungemia   Chest pain    Time spent: 35 minutes    [facility] M  [facility] Pager [phone]. If 7PM-7AM, please contact night-coverage at [email or web address], password [facility] 08/06/2013, 10:15 AM  LOS: 12 days

## 2013-08-06 NOTE — Progress Notes (Signed)
Patient has had a nosebleed daily for the the past two days, md aware.

## 2013-08-06 NOTE — Progress Notes (Signed)
UR chart review completed.  

## 2013-08-06 NOTE — Progress Notes (Signed)
Patient seen, independently examined and chart reviewed. I agree with exam, assessment and plan discussed with Toya Smothers, NP.  Interval details reviewed. Port-A-Cath was removed successfully, blood cultures obtained 48 hours after removal no growth, final. She remains afebrile with no leukocytosis, signs or symptoms to suggest ongoing fungemia or infection. However her hospitalization has remained complicated by ongoing difficult to manage sickle cell pain, requiring PCA. Because of ongoing pain further imaging testing conducted which reveals no evidence of pneumonia, infiltrate.  Updated recommendations from infectious disease noted, patient currently on oral Diflucan. She has also had several nosebleeds.  Afebrile, vital signs stable. No hypoxia. Chemistry panel unremarkable. Hemoglobin is stable at 7.8. CT chest noted. Repeat blood cultures are no growth, final. Both initial blood cultures as well as the port tip culture were positive for Candida.  She appears calm and comfortable. Nontoxic. Exam is benign.  Continue oral Diflucan. There is no imaging, signs or symptoms to suggest fracture infection or acute chest.  In regard to her sickle cell crisis, continue PCA for now, will reevaluate needs with [name] in the morning. Her hemoglobin has remained stable.  CT chest findings notable for multiple bilateral nodular opacities. I discussed with Dr. Rito Ehrlich the interpreting radiologist. These are nonspecific. Their appearance does not suggest bacterial infection. Atypical infection is possible. There is no cavitation but emboli from infected port could have this appearance. I will discuss these new findings with infectious disease. Given her current clinical stability I do not think any change in her management as ordered at this point.  [name], MD [facility] [phone]

## 2013-08-07 ENCOUNTER — Encounter (HOSPITAL_COMMUNITY)
Admission: EM | Disposition: A | Discharge status: Home / Self Care | Payer: Self-pay | Source: Home / Self Care | Attending: Internal Medicine

## 2013-08-07 ENCOUNTER — Encounter (HOSPITAL_COMMUNITY): Payer: Self-pay | Admitting: *Deleted

## 2013-08-07 DIAGNOSIS — I059 Rheumatic mitral valve disease, unspecified: Secondary | ICD-10-CM

## 2013-08-07 DIAGNOSIS — R9389 Abnormal findings on diagnostic imaging of other specified body structures: Secondary | ICD-10-CM | POA: Diagnosis not present

## 2013-08-07 HISTORY — PX: TEE WITHOUT CARDIOVERSION: SHX5443

## 2013-08-07 LAB — CBC
Hemoglobin: 7.6 g/dL — ABNORMAL LOW (ref 12.0–15.0)
MCHC: 35.8 g/dL (ref 30.0–36.0)
MCV: 97.7 fL (ref 78.0–100.0)
RBC: 2.17 MIL/uL — ABNORMAL LOW (ref 3.87–5.11)

## 2013-08-07 SURGERY — ECHOCARDIOGRAM, TRANSESOPHAGEAL
Anesthesia: Moderate Sedation

## 2013-08-07 MED ORDER — FENTANYL CITRATE 0.05 MG/ML IJ SOLN
INTRAMUSCULAR | Status: AC
  Filled 2013-08-07: qty 2

## 2013-08-07 MED ORDER — MIDAZOLAM HCL 5 MG/5ML IJ SOLN
INTRAMUSCULAR | Status: AC
  Filled 2013-08-07: qty 10

## 2013-08-07 MED ORDER — LIDOCAINE VISCOUS 2 % MT SOLN
OROMUCOSAL | Status: DC | PRN
  Administered 2013-08-07: 20 mL via OROMUCOSAL

## 2013-08-07 MED ORDER — MIDAZOLAM HCL 5 MG/5ML IJ SOLN
INTRAMUSCULAR | Status: AC
  Filled 2013-08-07: qty 5

## 2013-08-07 MED ORDER — FENTANYL CITRATE 0.05 MG/ML IJ SOLN
INTRAMUSCULAR | Status: AC
  Filled 2013-08-07: qty 4

## 2013-08-07 MED ORDER — BUTAMBEN-TETRACAINE-BENZOCAINE 2-2-14 % EX AERO
INHALATION_SPRAY | CUTANEOUS | Status: DC | PRN
  Administered 2013-08-07: 1 via TOPICAL

## 2013-08-07 MED ORDER — OXYCODONE HCL 5 MG PO TABS: 10.0000 mg | ORAL_TABLET | ORAL | Status: DC | PRN

## 2013-08-07 MED ORDER — HYDROMORPHONE HCL PF 1 MG/ML IJ SOLN
1.0000 mg | INTRAMUSCULAR | Status: DC | PRN
  Administered 2013-08-07 – 2013-08-08 (×6): 1 mg via INTRAVENOUS
  Filled 2013-08-07 (×6): qty 1

## 2013-08-07 MED ORDER — LIDOCAINE VISCOUS 2 % MT SOLN
OROMUCOSAL | Status: AC
  Filled 2013-08-07: qty 15

## 2013-08-07 MED ORDER — FENTANYL CITRATE 0.05 MG/ML IJ SOLN
INTRAMUSCULAR | Status: DC | PRN
  Administered 2013-08-07 (×4): 50 ug via INTRAVENOUS

## 2013-08-07 MED ORDER — MIDAZOLAM HCL 5 MG/5ML IJ SOLN
INTRAMUSCULAR | Status: DC | PRN
  Administered 2013-08-07 (×2): 1 mg via INTRAVENOUS
  Administered 2013-08-07 (×2): 2 mg via INTRAVENOUS
  Administered 2013-08-07 (×3): 1 mg via INTRAVENOUS
  Administered 2013-08-07: 2 mg via INTRAVENOUS
  Administered 2013-08-07: 1 mg via INTRAVENOUS

## 2013-08-07 MED ORDER — SODIUM CHLORIDE 0.9 % IV SOLN
INTRAVENOUS | Status: DC
  Administered 2013-08-07: 14:00:00 via INTRAVENOUS

## 2013-08-07 NOTE — Procedures (Signed)
Procedure: Transesophageal echocadiogram  Operator: Antoine Poche MD   Procedure Details Patient brought down to the endoscopy suite after appropriate consent was obtained. Viscous lidocaine and cetacaine spray were used to numb the posterior oropharynx. A bite guard was put into place. 12 mg of versed and 200 mg of fentanyl were required for appropriate sedation. After sufficient sedation was achieved, the TEE probe was inserted into the midesophageal position. Multiple views were taken. The patient tolerated the procedure without complication. At the end of the procedure the probe was removed, and the patient was monitored in recovery.    Findings Please refer to the offical echo report. There is no evidence of valvular vegetation based on preliminary review.   Dina Rich MD

## 2013-08-07 NOTE — Progress Notes (Signed)
Patient seen, independently examined and chart reviewed. I agree with exam, assessment and plan discussed with Dawn Smothers, NP.  Overall patient is feeling better today with less chest pain. Breathing fine. No issues.  She appears calm and comfortable, remains afebrile with stable vital signs. Her exam is benign.  Chest CT was discussed with Dr. Ninetta [name] with infectious disease. He recommended TEE to rule out fungal endocarditis.  Cardiology currently provided same-day TEE, preliminary report negative for vegetation.  In discussion with infectious disease, recommendations for to extend therapy a total of 3 weeks rather than to with antifungal's. No further evaluation is suggested.  Overall her sickle cell pain is improving. Per Dr. Anastasio [name] recommendation she has been transitioned to oral medications. Would anticipate discharge home 11/15  Dawn Sacks, MD [facility] [phone]

## 2013-08-07 NOTE — Progress Notes (Signed)
[facility] PROGRESS NOTE  Dawn [name]:[id number] DOB: [birth year] DOA: 07/25/2013 PCP: [name],[name], MD  Assessment/Plan: Sickle cell anemia with acute pain crisis, hemolysis: Patient started on PCA dilaudid on 11/7 for better pain control. Adjustments were made to PCA dosing after discussion with patient's primary care doctor [name] on 2 separate occasions. PCA weaning started 08/05/13. Patient reports pain 6/10. MS Contin 15mg  BID started 08/06/13 to assist with weaning. Discussed weaning with [name] who recommended, given night time usage of PCA, discontinue PCA and transition to oral meds. Will discontinue PCA and resume home regimen with prn available for breakthrough. CT chest yields no PE. S/P transfusipn 1 unit PRBC's for HG 6.8. Hg stable.  Continue Toradol for adjunct therapy. Cbc in am  1. Fever: Likely related to fungemia. Resolved. Micafungin discontinued 08/02/13. Urinalysis was equivocal, Repeat chest x-ray was negative. She remains non toxic and non hypoxic. Her sternal pain is consistent with her at usual crisis pain. No indication of acute chest syndrome.  2. SSA : Continue Exjade, Folic acid, hydroxyurea.  3. Fungemia: likely from porta cath.Porta cath removed 07/29/13. repeat blood cultures have shown no growth. Discussed cultures and course of treatment with Dr. [name], Infectious Disease 08/02/13. Recommendations were to discontinue Micafungin and start patient on Fluconazole 400mg  po daily for a total of 2 weeks of therapy from the first set of negative blood cultures. Fluconazole will need to be completed until 08/12/13. 2D echo does not show any vegetations. 4. Tachycardia: Mild and improving, related to #1. Will provide gentle IV fluids. Continue to monitor on tele.  5. Abnormal CT: Chest CT yields multiple new bilateral nodular opacities measuring up to 11mm, likely infectious/inflammatory. Case discussed with radiology who noted these ar nonspecific  and appearance does not suggest bacterial infection. Atypical infection possible. Case discussed with ID who recommended TEE. Cardiology consulted and TEE scheduled for this afternoon.   Code Status: full Family Communication: none present Disposition Plan: home when ready hopefully 24-48 hours   Consultants:  Cardiology  General surgery  Procedures:  TEE 11/114/14  Porta cath removal  Antibiotics: Micofungin 07/27/13>>08/02/13  Fluconazole 08/02/13 >>>  HPI/Subjective: Sitting on side of bed. Reports pain 6/10.   Objective: Filed Vitals:   08/07/13 0956  BP: 122/74  Pulse: 101  Temp: 98.4 F (36.9 C)  Resp: 20    Intake/Output Summary (Last 24 hours) at 08/07/13 1240 Last data filed at 08/07/13 0916  Gross per 24 hour  Intake   1080 ml  Output      0 ml  Net   1080 ml   Filed Weights   08/05/13 0531 08/06/13 0500 08/07/13 0534  Weight: 72.3 kg (159 lb 6.3 oz) 72.5 kg (159 lb 13.3 oz) 70.534 kg (155 lb 8 oz)    Exam:   General:  Well nourished NAD  Cardiovascular: RRR No MGR No LE edema  Respiratory: normal effort BS clear bilaterally no wheeze no rhonchi  Abdomen: round soft +BS non-tender to palpation  Musculoskeletal: no clubbing no cyanosis. Good muscle tone   Data Reviewed: Basic Metabolic Panel:  Recent Labs Lab 08/03/13 0533 08/04/13 0511 08/05/13 0521 08/06/13 0445  NA 140 138 140 138  K 4.3 4.0 4.1 3.9  CL 105 104 106 103  CO2 28 26 26 26   GLUCOSE 95 96 92 88  BUN 7 8 7 6   CREATININE 0.64 0.67 0.63 0.64  CALCIUM 8.9 8.8 8.9 8.8   Liver Function Tests:  Recent Labs  Lab 08/04/13 0511  AST 84*  ALT 31  ALKPHOS 173*  BILITOT 5.5*  PROT 7.1  ALBUMIN 2.5*   No results found for this basename: LIPASE, AMYLASE,  in the last 168 hours No results found for this basename: AMMONIA,  in the last 168 hours CBC:  Recent Labs Lab 08/03/13 0533 08/04/13 0511 08/05/13 0521 08/06/13 0445 08/07/13 0452  WBC 8.9 11.9* 8.7 8.2 7.5   NEUTROABS  --  7.5  --   --   --   HGB 7.5* 6.8* 7.7* 7.8* 7.6*  HCT 21.7* 19.4* 21.8* 22.2* 21.2*  MCV 101.4* 101.0* 97.3 97.4 97.7  PLT 236 264 271 297 293   Cardiac Enzymes: No results found for this basename: CKTOTAL, CKMB, CKMBINDEX, TROPONINI,  in the last 168 hours BNP (last 3 results) No results found for this basename: PROBNP,  in the last 8760 hours CBG: No results found for this basename: GLUCAP,  in the last 168 hours  Recent Results (from the past 240 hour(s))  CATH TIP CULTURE     Status: None   Collection Time    07/29/13 10:10 AM      Result Value Range Status   Specimen Description CATH TIP LEFT PORTA CATH   Final   Special Requests NONE   Final   Culture     Final   Value: 15 COLONIES CANDIDA ALBICANS     Performed at [facility]   Report Status 08/01/2013 FINAL   Final  CULTURE, BLOOD (ROUTINE X 2)     Status: None   Collection Time    07/31/13  9:53 AM      Result Value Range Status   Specimen Description Blood RIGHT ARM   Final   Special Requests BOTTLES DRAWN AEROBIC AND ANAEROBIC 8 CC EACH   Final   Culture NO GROWTH 5 DAYS   Final   Report Status 08/05/2013 FINAL   Final  CULTURE, BLOOD (ROUTINE X 2)     Status: None   Collection Time    07/31/13  9:53 AM      Result Value Range Status   Specimen Description Blood RIGHT HAND   Final   Special Requests BOTTLES DRAWN AEROBIC AND ANAEROBIC 8CC EACH   Final   Culture NO GROWTH 5 DAYS   Final   Report Status 08/05/2013 FINAL   Final     Studies: Ct Angio Chest Pe W/cm &/or Wo Cm  08/06/2013   ADDENDUM REPORT: 08/06/2013 15:35  ADDENDUM: Addended for further clarification.  Case was discussed with the physician caring for the patient (Dr. [name]). Unfortunately, the pulmonary nodules are favored to be infectious/inflammatory but are otherwise extremely nonspecific. While the appearance does not suggest a bacterial pneumonia, atypical infection is possible. Given the peripheral distribution  of some of the nodules, hematogenous spread of infection should be considered. However, none of the nodules are cavitary.  By report, the patient had recent removal of an infected chest port. Septic emboli from a infected port could certainly have this appearance.   Electronically Signed   By: Charline Bills M.D.   On: 08/06/2013 15:35   08/06/2013   CLINICAL DATA:  Chest pain  EXAM: CT ANGIOGRAPHY CHEST WITH CONTRAST  TECHNIQUE: Multidetector CT imaging of the chest was performed using the standard protocol during bolus administration of intravenous contrast. Multiplanar CT image reconstructions including MIPs were obtained to evaluate the vascular anatomy.  CONTRAST:  OMNIPAQUE IOHEXOL 350 MG/ML SOLN  COMPARISON:  Chest radiograph dated 08/03/2013. CTA chest dated 05/14/2013.  FINDINGS: No evidence of pulmonary embolism.  8 x 11 mm patchy/nodular opacity in the medial left upper lobe (series 5/ image 31), new.  Additional new bilateral pulmonary nodules, likely infectious/inflammatory, including:  --8 mm nodule in the right lung apex (series 5/ image 18)  --6 mm nodule in the lingula (series 5/ image 43)  --7 mm nodule in the left lower lobe (series 5/ image 45)  --8 mm lingular nodule (series 5/ image 53)  --5 mm right lower lobe nodule (series 5/image 54)  Linear scarring in the lateral right upper lobe and bilateral lower lobes. No pleural effusion or pneumothorax.  Visualized thyroid is unremarkable.  Heart is mildly enlarged. Suspected fluid in the inferior pericardial recess/subcarinal region, grossly unchanged. No suspicious hilar or axillary lymphadenopathy.  Visualized upper abdomen is notable for a mildly nodular hepatic contour, splenic autoinfarction, and probable right upper pole renal scarring.  Vertebral sclerosis with endplate changes characteristic of sickle cell disease.  Review of the MIP images confirms the above findings.  IMPRESSION: No evidence of pulmonary embolism.  Multiple new  bilateral nodular opacities measuring up to 11 mm, as described above, likely infectious/inflammatory.  Additional stable changes related to known sickle cell disease.  Electronically Signed: By: Charline Bills M.D. On: 08/05/2013 16:46    Scheduled Meds: . deferasirox  1,500 mg Oral QAC breakfast  . fluconazole  400 mg Oral Q24H  . folic acid  1 mg Oral Daily  . hydroxyurea  1,000 mg Oral QODAY   And  . hydroxyurea  500 mg Oral QODAY  . loratadine  10 mg Oral Daily  . morphine  15 mg Oral Q12H  . pantoprazole  40 mg Oral Daily  . polyethylene glycol  17 g Oral Daily  . senna-docusate  1 tablet Oral BID  . sodium chloride  10-40 mL Intracatheter Q12H   Continuous Infusions: . dextrose 5 % and 0.45 % NaCl with KCl 20 mEq/L 10 mL/hr at 08/07/13 4782    Active Problems:   Sickle cell anemia   Leukocytosis   HTN (hypertension)   Fever, unspecified   Fungemia   Chest pain   Abnormal chest CT    Time spent: 30 minutes    [facility] M  [facility] Pager [phone]. If 7PM-7AM, please contact night-coverage at [email or web address], password [facility] 08/07/2013, 12:40 PM  LOS: 13 days

## 2013-08-07 NOTE — H&P (Signed)
[facility] History and Physical  Emrie Gayle Neuroth  RUE:[id number]  DOB: 1981-02-21   DOA: 08/07/2013   PCP:   [name],[name], MD   Chief Complaint:  Chest and back pain for 2 days       HPI: Dawn [name] is a 32 y.o. female.   Young [place] American lady with known hemoglobin SS, presents with the above symptoms; reports she's been having fevers since Thursday. Pain is not helped by her home medications. She she denies any cough or cold, frequency or dysuria. Denies any known precipitating factors for sickle cell crisis, believes it may be due to the change in season.  She's also been having pain and swelling of the right knee, although left knee is also painful    All systems negative except as marked bold or noted in the HPI;  Constitutional:    malaise, fever and chills. ;  Eyes:   eye pain, redness and discharge. ;  ENMT:   ear pain, hoarseness, nasal congestion, sinus pressure and sore throat. ;  Cardiovascular:     palpitations, diaphoresis, dyspnea and peripheral edema.  Respiratory:   cough, hemoptysis, wheezing and stridor. ;  Gastrointestinal:  Nausea due to pain meds, vomiting, diarrhea, constipation, abdominal pain, melena, blood in stool, hematemesis, jaundice and rectal bleeding. unusual weight loss..   Genitourinary:    frequency, dysuria, incontinence,flank pain and hematuria; Musculoskeletal:   back pain and neck pain.  Swelling knee and trauma.;  Skin: .  pruritus, rash, abrasions, bruising and skin lesion.; ulcerations Neuro:    headache, lightheadedness and neck stiffness.  weakness, altered level of consciousness, altered mental status, extremity weakness, burning feet, involuntary movement, seizure and syncope.  Psych:    anxiety, depression, insomnia, tearfulness, panic attacks, hallucinations, paranoia, suicidal or homicidal ideation    Past Medical History  Diagnosis Date  . Sickle cell disease   . Sickle cell disease, type S   . Blood transfusion    . 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 HCAP (healthcare-associated pneumonia) 05/19/2013    Past Surgical History  Procedure Laterality Date  . Cholecystectomy    .  left knee acl reconstruction    . Cesarean section      x 2  . Portacath placement    . Portacath placement Left 02/23/2013    Procedure: INSERTION PORT-A-CATH;  Surgeon: Fabio Bering, MD;  Location: [facility];  Service: General;  Laterality: Left;  . Port-a-cath removal Left 07/29/2013    Procedure: REMOVAL PORT-A-CATH;  Surgeon: Marlane Hatcher, MD;  Location: [facility];  Service: General;  Laterality: Left;    Medications:  HOME MEDS: Prior to Admission medications   Medication Sig Start Date End Date Taking? Authorizing Provider  cetirizine (ZYRTEC) 10 MG tablet Take 1 tablet (10 mg total) by mouth daily as needed. For allergies 07/21/12  Yes Lizbeth Bark, FNP  deferasirox (EXJADE) 500 MG disintegrating tablet Take 1,500 mg by mouth daily before breakfast.   Yes Historical Provider, MD  folic acid (FOLVITE) 1 MG tablet Take 1 mg by mouth daily.     Yes Historical Provider, MD  hydroxyurea (HYDREA) 500 MG capsule Take 1-2 capsules (500-1,000 mg total) by mouth as directed. Take 500mg  (1 tablet) ON ODD DAYS OF THE MONTH AND 1000 MG (2) TABLETS ON THE EVEN DAYS. 03/04/13  Yes Altha Harm, MD  oxyCODONE (OXY IR/ROXICODONE) 5 MG immediate release tablet Take 1 tablet (5 mg total) by mouth every 4 (four) hours as  needed for pain. 07/23/13  Yes Lyanne Co, MD  Oxycodone HCl 10 MG TABS Take 1 tablet (10 mg total) by mouth every 4 (four) hours as needed. 04/01/13  Yes Altha Harm, MD  promethazine (PHENERGAN) 12.5 MG tablet Take 1 tablet (12.5 mg total) by mouth every 6 (six) hours as needed for nausea. 1-2 prn for nausea begin with 1 if no relief may repeat x's 1 05/01/13  Yes Grayce Sessions, NP  Vitamin D, Ergocalciferol, (DRISDOL) 50000 UNITS CAPS Take 1 capsule (50,000 Units total) by mouth every 7  (seven) days. Takes on Sundays. 03/02/13  Yes Altha Harm, MD     Allergies:  Allergies  Allergen Reactions  . Desferal [Deferoxamine] Hives    Local reaction on arm only during infusion. Can take with benadryl   . Latex Other (See Comments)    REACTION: Pt experiences a burning sensation on contacted skin areas  . Lisinopril Other (See Comments) and Cough    REACTION: Sore/scratchy throat  . Tape Other (See Comments)    REACTION: Pt. Experiences a burning sensation on contacted skin areas    Social History:   reports that she has never smoked. She does not have any smokeless tobacco history on file. She reports that she does not drink alcohol or use illicit drugs.  Family History: Family History  Problem Relation Age of Onset  . Sickle cell trait Mother   . Sickle cell trait Father   . Hypertension Father   . Diabetes Father   . Sickle cell trait Sister   . Cancer Paternal Aunt     Breast     Physical Exam: Filed Vitals:   08/07/13 0138 08/07/13 0534 08/07/13 0900 08/07/13 0956  BP: 121/79 124/69  122/74  Pulse: 97 104  101  Temp:  98.4 F (36.9 C)  98.4 F (36.9 C)  TempSrc:  Oral  Oral  Resp: 18 18 18 20   Height:      Weight:  155 lb 8 oz (70.534 kg)    SpO2: 95% 94% 97% 96%   Blood pressure 122/74, pulse 101, temperature 98.4 F (36.9 C), temperature source Oral, resp. rate 20, height 5\' 3"  (1.6 m), weight 155 lb 8 oz (70.534 kg), SpO2 96.00%. Body mass index is 27.55 kg/(m^2).   GEN:  Pleasant young African American lady lying bed in painful; cooperative with exam PSYCH:  alert and oriented x4;  affect is appropriate. HEENT: Mucous membranes pale and icteric; PERRLA; EOM intact; no cervical lymphadenopathy nor thyromegaly or carotid bruit; no JVD; Breasts:: Not examined CHEST WALL: No tenderness CHEST: Normal respiration, clear to auscultation bilaterally HEART: Tachycardic regular rhythm; 1/6 systolic murmur BACK: No kyphosis no scoliosis; no CVA  tenderness ABDOMEN: Obese, soft non-tender; no masses, no organomegaly, normal abdominal bowel sounds; no pannus; no intertriginous candida. Rectal Exam: Not done EXTREMITIES: Swollen tender right knee age-appropriate arthropathy of the hands and knees; no edema; no ulcerations.  Genitalia: not examined PULSES: 2+ and symmetric SKIN: Normal hydration no rash or ulceration CNS: Cranial nerves 2-12 grossly intact no focal lateralizing neurologic deficit   Labs on Admission:  Basic Metabolic Panel:  Recent Labs Lab 08/03/13 0533 08/04/13 0511 08/05/13 0521 08/06/13 0445  NA 140 138 140 138  K 4.3 4.0 4.1 3.9  CL 105 104 106 103  CO2 28 26 26 26   GLUCOSE 95 96 92 88  BUN 7 8 7 6   CREATININE 0.64 0.67 0.63 0.64  CALCIUM 8.9 8.8  8.9 8.8   Liver Function Tests:  Recent Labs Lab 08/04/13 0511  AST 84*  ALT 31  ALKPHOS 173*  BILITOT 5.5*  PROT 7.1  ALBUMIN 2.5*   No results found for this basename: LIPASE, AMYLASE,  in the last 168 hours No results found for this basename: AMMONIA,  in the last 168 hours CBC:  Recent Labs Lab 08/03/13 0533 08/04/13 0511 08/05/13 0521 08/06/13 0445 08/07/13 0452  WBC 8.9 11.9* 8.7 8.2 7.5  NEUTROABS  --  7.5  --   --   --   HGB 7.5* 6.8* 7.7* 7.8* 7.6*  HCT 21.7* 19.4* 21.8* 22.2* 21.2*  MCV 101.4* 101.0* 97.3 97.4 97.7  PLT 236 264 271 297 293   Cardiac Enzymes: No results found for this basename: CKTOTAL, CKMB, CKMBINDEX, TROPONINI,  in the last 168 hours BNP: No components found with this basename: POCBNP,  D-dimer: No components found with this basename: D-DIMER,  CBG: No results found for this basename: GLUCAP,  in the last 168 hours  Radiological Exams on Admission: Ct Angio Chest Pe W/cm &/or Wo Cm  08/06/2013   ADDENDUM REPORT: 08/06/2013 15:35  ADDENDUM: Addended for further clarification.  Case was discussed with the physician caring for the patient (Dr. [name]). Unfortunately, the pulmonary nodules are favored  to be infectious/inflammatory but are otherwise extremely nonspecific. While the appearance does not suggest a bacterial pneumonia, atypical infection is possible. Given the peripheral distribution of some of the nodules, hematogenous spread of infection should be considered. However, none of the nodules are cavitary.  By report, the patient had recent removal of an infected chest port. Septic emboli from a infected port could certainly have this appearance.   Electronically Signed   By: Charline Bills M.D.   On: 08/06/2013 15:35   08/06/2013   CLINICAL DATA:  Chest pain  EXAM: CT ANGIOGRAPHY CHEST WITH CONTRAST  TECHNIQUE: Multidetector CT imaging of the chest was performed using the standard protocol during bolus administration of intravenous contrast. Multiplanar CT image reconstructions including MIPs were obtained to evaluate the vascular anatomy.  CONTRAST:  OMNIPAQUE IOHEXOL 350 MG/ML SOLN  COMPARISON:  Chest radiograph dated 08/03/2013. CTA chest dated 05/14/2013.  FINDINGS: No evidence of pulmonary embolism.  8 x 11 mm patchy/nodular opacity in the medial left upper lobe (series 5/ image 31), new.  Additional new bilateral pulmonary nodules, likely infectious/inflammatory, including:  --8 mm nodule in the right lung apex (series 5/ image 18)  --6 mm nodule in the lingula (series 5/ image 43)  --7 mm nodule in the left lower lobe (series 5/ image 45)  --8 mm lingular nodule (series 5/ image 53)  --5 mm right lower lobe nodule (series 5/image 54)  Linear scarring in the lateral right upper lobe and bilateral lower lobes. No pleural effusion or pneumothorax.  Visualized thyroid is unremarkable.  Heart is mildly enlarged. Suspected fluid in the inferior pericardial recess/subcarinal region, grossly unchanged. No suspicious hilar or axillary lymphadenopathy.  Visualized upper abdomen is notable for a mildly nodular hepatic contour, splenic autoinfarction, and probable right upper pole renal scarring.   Vertebral sclerosis with endplate changes characteristic of sickle cell disease.  Review of the MIP images confirms the above findings.  IMPRESSION: No evidence of pulmonary embolism.  Multiple new bilateral nodular opacities measuring up to 11 mm, as described above, likely infectious/inflammatory.  Additional stable changes related to known sickle cell disease.  Electronically Signed: By: Charline Bills M.D. On: 08/05/2013 16:46  Assessment/Plan  Active Problems:   Sickle cell anemia   Leukocytosis   HTN (hypertension)   Fever, unspecified   Fungemia   Chest pain  acute sickle cell painful crisis with hemolysis   PLAN: IV fluids Pain control with high-dose Dilaudid drip Empiric antibiotic therapy Continuous pulse oximetry  Other plans as per orders.  Code Status: Full code    Antoine Poche Nocturnist [facility] Pager [phone]   08/07/2013, 11:11 AM  Procedure H&P for patient Melvinia, medical records reviewed. 32 year old female with sickle cell disease now with fungemia and evidence of septic emboli. Concern for possible endocarditis. Negative TTE, however given fungal etiology and evidence of septic emboli warrants TEE. Will perform today under moderate sedation. Patient last ated at 8AM with only a muffin, will plan for 6 hours NPO and perform the procedure at 230pm.    Dina Rich MD [facility] Card[facility]

## 2013-08-08 LAB — TYPE AND SCREEN
ABO/RH(D): A POS
DAT, IgG: NEGATIVE
Unit division: 0
Unit division: 0

## 2013-08-08 MED ORDER — ENOXAPARIN SODIUM 40 MG/0.4ML ~~LOC~~ SOLN
40.0000 mg | SUBCUTANEOUS | Status: DC
  Administered 2013-08-08 – 2013-08-09 (×2): 40 mg via SUBCUTANEOUS
  Filled 2013-08-08 (×2): qty 0.4

## 2013-08-08 MED ORDER — HYDROMORPHONE HCL PF 1 MG/ML IJ SOLN
1.0000 mg | INTRAMUSCULAR | Status: DC | PRN
  Administered 2013-08-08 – 2013-08-10 (×8): 1 mg via INTRAVENOUS
  Filled 2013-08-08 (×8): qty 1

## 2013-08-08 MED ORDER — OXYCODONE HCL 5 MG PO TABS
10.0000 mg | ORAL_TABLET | ORAL | Status: DC | PRN
  Administered 2013-08-08 – 2013-08-10 (×3): 10 mg via ORAL
  Filled 2013-08-08 (×3): qty 2

## 2013-08-08 NOTE — Progress Notes (Signed)
[facility] PROGRESS NOTE  Dawn [name] WUJ:[id number] DOB: 1980-11-20 DOA: 07/25/2013 PCP: [name],[name], MD  Assessment/Plan: 1. Candida fungemia: Secondary to infected Port-A-Cath which has been removed 11/5. Remains afebrile, repeat cultures were negative. Continue oral Diflucan a total of 3 weeks. Of note chest CT suggested septic emboli. 2. Sickle cell pain crisis: Overall appears to be improving daily. Wean off PCA yesterday. Continue long-acting narcotics as a bridge per [name]. I have encouraged patient to utilize short-acting narcotics, attempt to wean off bolus dosing of IV pain medication.    Utilize breakthrough oral narcotics rather than IV pain medication.  Anticipate discharge next 48 hours as pain comes under control.  Pending studies:   none  Code Status: full DVT prophylaxis: Lovenox Family Communication: none present Disposition Plan: as above  [name], MD  [facility]  Pager [phone] If 7PM-7AM, please contact night-coverage at [email or web address], password [facility] 08/08/2013, 3:09 PM  LOS: 14 days   Summary: 32 year old woman presented with febrile illness and sickle cell pain crisis. Blood cultures grew out Candida necessitating removal of Port-A-Cath, the tip of which was also positive for Candida. In consultation with ID the patient was maintained on antifungals. She continued to have sickle cell crisis pain, and further imaging was obtained of the chest to rule out pulmonary embolism or acute pulmonary pathology. There is no evidence of PE or pneumonia but multiple nodules suggestive of septic emboli were noted. Therefore transesophageal echocardiogram which was obtained was negative. Per ID, continue antifungals for total of 3 weeks. No further evaluation suggested at this time.  Consultants:  Cardiology for transesophageal echocardiogram  Oral surgery for port removal  Infectious disease by telephone  Procedures:  Port-A-Cath  removal 11/5  2-D echocardiogram 11/7: Normal systolic function of the left ventricle. No obvious vegetations seen.  Transesophageal echocardiogram 11/14: No evidence of endocarditis.  Antibiotics:  Micofungin 07/27/13>>08/02/13   Fluconazole 08/02/13 >>> 08/23/13    HPI/Subjective: Pain about 6/10. Overall minimal improvement. No new issues.  Objective: Filed Vitals:   08/07/13 2042 08/08/13 0239 08/08/13 0522 08/08/13 1505  BP: 120/65 121/78 125/71 108/70  Pulse: 101 95 99 98  Temp: 98.2 F (36.8 C) 98.5 F (36.9 C) 98.5 F (36.9 C) 98.7 F (37.1 C)  TempSrc: Oral Oral Oral Oral  Resp: 20 18 18 18   Height:      Weight:      SpO2: 95% 93% 96% 95%    Intake/Output Summary (Last 24 hours) at 08/08/13 1509 Last data filed at 08/08/13 0854  Gross per 24 hour  Intake    960 ml  Output      0 ml  Net    960 ml     Filed Weights   08/06/13 0500 08/07/13 0534 08/07/13 1408  Weight: 72.5 kg (159 lb 13.3 oz) 70.534 kg (155 lb 8 oz) 70.308 kg (155 lb)    Exam:   Afebrile, vital signs are stable.  General: Appears calm and comfortable. Appears more comfortable today.  Cardiovascular: Regular rate and rhythm. No murmur, rub or gallop. No lower extremity edema.  Respiratory: Clear to auscultation bilaterally. No wheezes, rales or rhonchi. Normal respiratory effort.  Data Reviewed:  No new data today  Scheduled Meds: . deferasirox  1,500 mg Oral QAC breakfast  . fluconazole  400 mg Oral Q24H  . folic acid  1 mg Oral Daily  . hydroxyurea  1,000 mg Oral QODAY   And  . hydroxyurea  500 mg Oral QODAY  .  loratadine  10 mg Oral Daily  . morphine  15 mg Oral Q12H  . pantoprazole  40 mg Oral Daily  . polyethylene glycol  17 g Oral Daily  . senna-docusate  1 tablet Oral BID  . sodium chloride  10-40 mL Intracatheter Q12H   Continuous Infusions: . dextrose 5 % and 0.45 % NaCl with KCl 20 mEq/L 20 mL/hr at 08/07/13 1757    Active Problems:   Sickle cell anemia    Leukocytosis   HTN (hypertension)   Fever, unspecified   Fungemia   Chest pain   Abnormal chest CT   Time spent 20 minutes

## 2013-08-09 MED ORDER — IBUPROFEN 600 MG PO TABS
600.0000 mg | ORAL_TABLET | Freq: Four times a day (QID) | ORAL | Status: DC
  Administered 2013-08-09 – 2013-08-10 (×4): 600 mg via ORAL
  Filled 2013-08-09 (×4): qty 1

## 2013-08-09 NOTE — Progress Notes (Signed)
[facility] PROGRESS NOTE  Dawn [name] ZOX:[id number] DOB: 07/19/1981 DOA: 07/25/2013 PCP: [name],[name], MD  Assessment/Plan: 1. Sickle cell pain crisis: Continues to be an issue with waxing and waning in intensity. Hemodynamic stable as his oxygenation. These predominantly in sternum consistent with previous crises. No signs or symptoms to suggest acute chest. Will check CBC in the morning. I have again reminded her to utilize oral breakthrough medications. 2. Candida fungemia: Secondary to infected Port-A-Cath which has been removed 11/5. Remains afebrile, repeat cultures were negative. Continue oral Diflucan a total of 3 weeks. Of note chest CT suggested septic emboli. TEE was negative. No further evaluation at this point.   Minimal use of oral breakthrough pain medications. We discussed utilizing oral before IV use.   Schedule ibuprofen  Needs to mobilize. Out of bed to chair. Ambulate with assistance at least twice per day.  Check CBC in the morning.  Overall appears stable  Pending studies:   none  Code Status: full DVT prophylaxis: Lovenox Family Communication: none present Disposition Plan: as above  [name], MD  [facility]  Pager [phone] If 7PM-7AM, please contact night-coverage at [email or web address], password [facility] 08/09/2013, 12:26 PM  LOS: 15 days   Summary: 32 year old woman presented with febrile illness and sickle cell pain crisis. Blood cultures grew out Candida necessitating removal of Port-A-Cath, the tip of which was also positive for Candida. In consultation with ID the patient was maintained on antifungals. She continued to have sickle cell crisis pain, and further imaging was obtained of the chest to rule out pulmonary embolism or acute pulmonary pathology. There is no evidence of PE or pneumonia but multiple nodules suggestive of septic emboli were noted. Therefore transesophageal echocardiogram which was obtained was negative. Per ID,  continue antifungals for total of 3 weeks. No further evaluation suggested at this time.  Consultants:  Cardiology for transesophageal echocardiogram  Oral surgery for port removal  Infectious disease by telephone  Procedures:  Port-A-Cath removal 11/5  2-D echocardiogram 11/7: Normal systolic function of the left ventricle. No obvious vegetations seen.  Transesophageal echocardiogram 11/14: No evidence of endocarditis.  Antibiotics:  Micofungin 07/27/13>>08/02/13   Fluconazole 08/02/13 >>> 08/23/13  HPI/Subjective: Pain about 7/10. Feels a little worse today. No leg or back pain. Just pain in sternum.  Objective: Filed Vitals:   08/08/13 1505 08/08/13 2233 08/09/13 0208 08/09/13 0644  BP: 108/70 123/71 116/72 114/71  Pulse: 98 96 91 93  Temp: 98.7 F (37.1 C) 98.7 F (37.1 C) 98.2 F (36.8 C) 98.3 F (36.8 C)  TempSrc: Oral Oral Oral Oral  Resp: 18 18 18 18   Height:      Weight:      SpO2: 95% 95% 94% 95%    Intake/Output Summary (Last 24 hours) at 08/09/13 1226 Last data filed at 08/09/13 0850  Gross per 24 hour  Intake 1758.5 ml  Output      0 ml  Net 1758.5 ml     Filed Weights   08/06/13 0500 08/07/13 0534 08/07/13 1408  Weight: 72.5 kg (159 lb 13.3 oz) 70.534 kg (155 lb 8 oz) 70.308 kg (155 lb)    Exam:   Afebrile, vital signs are stable.  General: appears calm, mildly uncomfortable.  CV: RRR, no m/r/g, no LE edema  Resp: CTA bilaterally, no w/r/r. Normal respiratory effort.  Data Reviewed:  No new data today  Scheduled Meds: . deferasirox  1,500 mg Oral QAC breakfast  . enoxaparin (LOVENOX) injection  40 mg  Subcutaneous Q24H  . fluconazole  400 mg Oral Q24H  . folic acid  1 mg Oral Daily  . hydroxyurea  1,000 mg Oral QODAY   And  . hydroxyurea  500 mg Oral QODAY  . loratadine  10 mg Oral Daily  . morphine  15 mg Oral Q12H  . pantoprazole  40 mg Oral Daily  . polyethylene glycol  17 g Oral Daily  . senna-docusate  1 tablet Oral  BID  . sodium chloride  10-40 mL Intracatheter Q12H   Continuous Infusions: . dextrose 5 % and 0.45 % NaCl with KCl 20 mEq/L 20 mL/hr at 08/09/13 1044    Active Problems:   Sickle cell anemia   Leukocytosis   HTN (hypertension)   Fever, unspecified   Fungemia   Chest pain   Abnormal chest CT   Time spent 15 minutes

## 2013-08-10 ENCOUNTER — Encounter (HOSPITAL_COMMUNITY): Payer: Self-pay | Admitting: Cardiology

## 2013-08-10 LAB — CBC
Hemoglobin: 7.8 g/dL — ABNORMAL LOW (ref 12.0–15.0)
MCHC: 35.3 g/dL (ref 30.0–36.0)
RDW: 25.1 % — ABNORMAL HIGH (ref 11.5–15.5)

## 2013-08-10 MED ORDER — MORPHINE SULFATE ER 15 MG PO TBCR: 15.0000 mg | EXTENDED_RELEASE_TABLET | Freq: Two times a day (BID) | ORAL | Status: DC

## 2013-08-10 MED ORDER — FLUCONAZOLE 200 MG PO TABS: 400.0000 mg | ORAL_TABLET | ORAL | Status: AC

## 2013-08-10 NOTE — Discharge Summary (Signed)
Physician Discharge Summary  Dawn [name] ZOX:[id number] DOB: 1980-12-28 DOA: 07/25/2013  PCP: [name],Dawn A., MD  Admit date: 07/25/2013 Discharge date: 08/10/2013  Time spent: 45 minutes  Recommendations for Outpatient Follow-up:  1. Dr. Ashley [name] 08/24/13 for evaluation of symptoms 2. Infectious disease will contact patient for follow up   Discharge Diagnoses:  Active Problems:   Sickle cell anemia   Leukocytosis   HTN (hypertension)   Fever, unspecified   Fungemia   Chest pain   Abnormal chest CT   Discharge Condition: stable  Diet recommendation: regular  Filed Weights   08/06/13 0500 08/07/13 0534 08/07/13 1408  Weight: 72.5 kg (159 lb 13.3 oz) 70.534 kg (155 lb 8 oz) 70.308 kg (155 lb)    History of present illness:  Dawn [name] is a 32 y.o. lady with known hemoglobin SS, presented on 07/25/13 with Chest and back pain. She reported she'd been having fevers as well. Pain was not helped by her home medications. She  denied any cough or cold, frequency or dysuria. Denied any known precipitating factors for sickle cell crisis, believed it may be due to the change in season.  She'd also been having pain and swelling of the right knee, although left knee is also painful   Hospital Course:  Sickle cell anemia with acute pain crisis, hemolysis: Patient started on PCA dilaudid on 11/7 for better pain control. Adjustments were made to PCA dosing after discussion with patient's primary care doctor Dr. Ashley [name] on 2 separate occasions. PCA weaning started 08/05/13. Patient continued with pain. MS Contin 10mg  BID started 08/06/13 to assist with weaning. Discussed weaning with Dr. Ashley [name] who recommended, given night time usage of PCA on 08/07/13, discontinue PCA and transition to oral meds. Patient resisted using po meds as first choice for pain management. PRN IV dilaudid discontinued 08/09/13. On day of discharge pain managed with po med. Will discharge with long acting morphine for  short course. CT of  chest yielded no PE. Patient transfused 2 units PRBC's for HG 6.8. Hg at discharge 7.8. Continue Toradol for adjunct therapy. Has follow up appointment with Dr. Ashley [name] 08/24/13.   1. Fever: Likely related to fungemia. Resolved. Micafungin discontinued 08/02/13. Urinalysis was equivocal, Repeat chest x-ray was negative. She remained non toxic and non hypoxic. Her sternal pain is consistent with her at usual crisis pain. No indication of acute chest syndrome.    2. SSA : See above Continued Exjade, Folic acid, hydroxyurea. 3. Fungemia: Porta cath removed 07/29/13 was + yeast. Repeat blood cultures have shown no growth. Discussed cultures and course of treatment with Dr. Orvan [name], Infectious Disease 08/02/13. Recommendations were to discontinue Micafungin and start patient on Fluconazole 400mg  po daily for a total of 3 weeks of therapy from the first set of negative blood cultures. Fluconazole will need to be completed until 08/23/13. 2D echo did not show any vegetations. ID will contact patient for follow up appointment 4. Tachycardia: Mild. Related to #1. Resolved at discharge  5. Abnormal CT: Chest CT yields multiple new bilateral nodular opacities measuring up to 11mm, likely infectious/inflammatory. Case discussed with radiology who noted these are nonspecific and appearance does not suggest bacterial infection. Case discussed with ID who recommended TEE which was done and negative for vegetation.    Procedures: TEE 11/114/14  Porta cath removal   Consultations: Cardiology  General surgery   Discharge Exam: Filed Vitals:   08/10/13 0940  BP: 114/70  Pulse: 89  Temp: 98.4 F (36.9 C)  Resp: 20    General: calm comfortable No complaints Cardiovascular: RRR No MGR No LE edema Respiratory: normal effort BS clear bilaterally no wheeze or rhonchi  Discharge Instructions   Future Appointments Provider Department Dept Phone   08/24/2013 1:30 PM Dawn Harm, MD  [facility] [phone]       Medication List         cetirizine 10 MG tablet  Commonly known as:  ZYRTEC  Take 1 tablet (10 mg total) by mouth daily as needed. For allergies     deferasirox 500 MG disintegrating tablet  Commonly known as:  EXJADE  Take 1,500 mg by mouth daily before breakfast.     fluconazole 200 MG tablet  Commonly known as:  DIFLUCAN  Take 2 tablets (400 mg total) by mouth daily.     folic acid 1 MG tablet  Commonly known as:  FOLVITE  Take 1 mg by mouth daily.     hydroxyurea 500 MG capsule  Commonly known as:  HYDREA  Take 1-2 capsules (500-1,000 mg total) by mouth as directed. Take 500mg  (1 tablet) ON ODD DAYS OF THE MONTH AND 1000 MG (2) TABLETS ON THE EVEN DAYS.     morphine 15 MG 12 hr tablet  Commonly known as:  MS CONTIN  Take 1 tablet (15 mg total) by mouth every 12 (twelve) hours.     Oxycodone HCl 10 MG Tabs  Take 1 tablet (10 mg total) by mouth every 4 (four) hours as needed.     promethazine 12.5 MG tablet  Commonly known as:  PHENERGAN  Take 1 tablet (12.5 mg total) by mouth every 6 (six) hours as needed for nausea. 1-2 prn for nausea begin with 1 if no relief may repeat x's 1     Vitamin D (Ergocalciferol) 50000 UNITS Caps capsule  Commonly known as:  DRISDOL  Take 1 capsule (50,000 Units total) by mouth every 7 (seven) days. Takes on Sundays.       Allergies  Allergen Reactions  . Desferal [Deferoxamine] Hives    Local reaction on arm only during infusion. Can take with benadryl   . Latex Other (See Comments)    REACTION: Pt experiences a burning sensation on contacted skin areas  . Lisinopril Other (See Comments) and Cough    REACTION: Sore/scratchy throat  . Tape Other (See Comments)    REACTION: Pt. Experiences a burning sensation on contacted skin areas       Follow-up Information   Follow up with [name],Dawn A., MD On 08/24/2013. (appointment at 1:30pm)    Specialty:  Internal Medicine    Contact information:   [address] [place] [address] [phone]        The results of significant diagnostics from this hospitalization (including imaging, microbiology, ancillary and laboratory) are listed below for reference.    Significant Diagnostic Studies: Dg Chest 2 View  07/27/2013   CLINICAL DATA:  Fever, sickle cell crisis  EXAM: CHEST  2 VIEW  COMPARISON:  07/25/2013  FINDINGS: Left subclavian Port-A-Cath stable, tip projecting over SVC.  Enlargement of cardiac silhouette with slight pulmonary vascular congestion.  Left basilar atelectasis.  No definite acute infiltrate, pleural effusion, or pneumothorax.  No acute osseous findings.  IMPRESSION: Left basilar atelectasis.   Electronically Signed   By: Ulyses Southward M.D.   On: 07/27/2013 14:40   Dg Chest 2 View  07/25/2013   CLINICAL DATA:  Low grade fever, chest  pain, back pain for 3 days, history sickle cell anemia  EXAM: CHEST  2 VIEW  COMPARISON:  05/13/2013  FINDINGS: Left subclavian Port-A-Cath stable tip projecting over SVC.  Upper normal heart size.  Mediastinal contours and pulmonary vascularity normal.  Minimal chronic peribronchial thickening.  No acute infiltrate, pleural effusion or pneumothorax.  Stable osseous findings.  IMPRESSION: Chronic changes.  No acute abnormalities.   Electronically Signed   By: Ulyses Southward M.D.   On: 07/25/2013 17:14   Ct Angio Chest Pe W/cm &/or Wo Cm  08/06/2013   ADDENDUM REPORT: 08/06/2013 15:35  ADDENDUM: Addended for further clarification.  Case was discussed with the physician caring for the patient (Dr. [name]). Unfortunately, the pulmonary nodules are favored to be infectious/inflammatory but are otherwise extremely nonspecific. While the appearance does not suggest a bacterial pneumonia, atypical infection is possible. Given the peripheral distribution of some of the nodules, hematogenous spread of infection should be considered. However, none of the nodules are  cavitary.  By report, the patient had recent removal of an infected chest port. Septic emboli from a infected port could certainly have this appearance.   Electronically Signed   By: Charline Bills M.D.   On: 08/06/2013 15:35   08/06/2013   CLINICAL DATA:  Chest pain  EXAM: CT ANGIOGRAPHY CHEST WITH CONTRAST  TECHNIQUE: Multidetector CT imaging of the chest was performed using the standard protocol during bolus administration of intravenous contrast. Multiplanar CT image reconstructions including MIPs were obtained to evaluate the vascular anatomy.  CONTRAST:  OMNIPAQUE IOHEXOL 350 MG/ML SOLN  COMPARISON:  Chest radiograph dated 08/03/2013. CTA chest dated 05/14/2013.  FINDINGS: No evidence of pulmonary embolism.  8 x 11 mm patchy/nodular opacity in the medial left upper lobe (series 5/ image 31), new.  Additional new bilateral pulmonary nodules, likely infectious/inflammatory, including:  --8 mm nodule in the right lung apex (series 5/ image 18)  --6 mm nodule in the lingula (series 5/ image 43)  --7 mm nodule in the left lower lobe (series 5/ image 45)  --8 mm lingular nodule (series 5/ image 53)  --5 mm right lower lobe nodule (series 5/image 54)  Linear scarring in the lateral right upper lobe and bilateral lower lobes. No pleural effusion or pneumothorax.  Visualized thyroid is unremarkable.  Heart is mildly enlarged. Suspected fluid in the inferior pericardial recess/subcarinal region, grossly unchanged. No suspicious hilar or axillary lymphadenopathy.  Visualized upper abdomen is notable for a mildly nodular hepatic contour, splenic autoinfarction, and probable right upper pole renal scarring.  Vertebral sclerosis with endplate changes characteristic of sickle cell disease.  Review of the MIP images confirms the above findings.  IMPRESSION: No evidence of pulmonary embolism.  Multiple new bilateral nodular opacities measuring up to 11 mm, as described above, likely infectious/inflammatory.   Additional stable changes related to known sickle cell disease.  Electronically Signed: By: Charline Bills M.D. On: 08/05/2013 16:46   Dg Chest Port 1 View  08/03/2013   CLINICAL DATA:  Chest pain and shortness of breath. History of sickle cell disease.  EXAM: PORTABLE CHEST - 1 VIEW  COMPARISON:  07/27/2013  FINDINGS: Avascular necrosis of the humeral heads. Midline trachea. Mild cardiomegaly, accentuated by a AP portable technique. No pleural effusion or pneumothorax. No congestive failure. Improved left base aeration with minimal scarring or subsegmental atelectasis remaining. Otherwise, no lobar consolidation.  IMPRESSION: Cardiomegaly without congestive failure.  Improved left basilar aeration with persistent subsegmental atelectasis or scarring.  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   Electronically Signed   By: Jeronimo Greaves M.D.   On: 08/03/2013 16:35    Microbiology: No results found for this or any previous visit (from the past 240 hour(s)).   Labs: Basic Metabolic Panel:  Recent Labs Lab 08/04/13 0511 08/05/13 0521 08/06/13 0445  NA 138 140 138  K 4.0 4.1 3.9  CL 104 106 103  CO2 26 26 26   GLUCOSE 96 92 88  BUN 8 7 6   CREATININE 0.67 0.63 0.64  CALCIUM 8.8 8.9 8.8   Liver Function Tests:  Recent Labs Lab 08/04/13 0511  AST 84*  ALT 31  ALKPHOS 173*  BILITOT 5.5*  PROT 7.1  ALBUMIN 2.5*   No results found for this basename: LIPASE, AMYLASE,  in the last 168 hours No results found for this basename: AMMONIA,  in the last 168 hours CBC:  Recent Labs Lab 08/04/13 0511 08/05/13 0521 08/06/13 0445 08/07/13 0452 08/10/13 0443  WBC 11.9* 8.7 8.2 7.5 5.9  NEUTROABS 7.5  --   --   --   --   HGB 6.8* 7.7* 7.8* 7.6* 7.8*  HCT 19.4* 21.8* 22.2* 21.2* 22.1*  MCV 101.0* 97.3 97.4 97.7 99.1  PLT 264 271 297 293 340   Cardiac Enzymes: No results found for this basename: CKTOTAL, CKMB, CKMBINDEX, TROPONINI,  in the last 168 hours BNP: BNP (last 3  results) No results found for this basename: PROBNP,  in the last 8760 hours CBG: No results found for this basename: GLUCAP,  in the last 168 hours     Signed:  Gwenyth Bender  [facility] 08/10/2013, 11:04 AM

## 2013-08-10 NOTE — Discharge Summary (Signed)
Patient seen, independently examined and chart reviewed. I agree with exam, assessment and plan discussed with Toya Smothers, NP.  Patient feels much better today. Pain is much better controlled. She would like to go home.  Afebrile, vital signs stable. Hemoglobin 7.8. Stable. She appears calm and comfortable. Smiles. Exam is benign.  Long complicated course summarized by Ms. Black. 2 separate issues:  1. Sickle cell crisis 2. Fungemia--this required removal of Port-A-Cath with culture tip being positive for Candida. Blood cultures were also positive initially. Replete blood cultures were negative. No fever since removal of port. Followup chest CT demonstrated bilateral pulmonary abnormalities concerning for septic emboli. She underwent transesophageal echocardiogram which showed no evidence of endocarditis. This was discussed with Dr. Ninetta Lights who recommended continuing antifungal therapy for total of 3 weeks and no further evaluation. She has been afebrile for prolonged period of time and is stable for discharge.   [name], MD [facility] [phone]

## 2013-08-10 NOTE — Progress Notes (Signed)
08/10/13 1324 Reviewed discharge instructions with patient via teachback. Given copy of instructions, medication list, prescription, f/u appointment as scheduled. Noted when home medications due on list. Pt verbalizes understanding of when to call MD via teachback. No c/o pain or nose bleed at this time. PICC line d/c'd per Derrick Ravel, RN. Pt in stable condition awaiting discharge home. Earnstine Regal, RN

## 2013-08-10 NOTE — Progress Notes (Signed)
UR chart review completed.  

## 2013-08-10 NOTE — Progress Notes (Signed)
08/10/13 1029 Patient c/o nosebleed this morning, states has changed small gauze "about 5 times" this morning. Small amount of blood noted. Applied ice pack to back of neck. Notified Toya Smothers, NP of nosebleed at 0930. No bleeding noted on reassessment, pt states comfortable. Earnstine Regal, RN

## 2013-08-10 NOTE — Progress Notes (Signed)
08/10/13 1354 Patient left floor in stable condition via w/c accompanied by nurse tech. Discharged home. Earnstine Regal, RN

## 2013-08-24 ENCOUNTER — Encounter: Payer: Self-pay | Admitting: Internal Medicine

## 2013-08-24 ENCOUNTER — Ambulatory Visit: Payer: BC Managed Care – PPO | Admitting: Internal Medicine

## 2013-08-24 VITALS — BP 135/85 | HR 95 | Temp 98.7°F | Resp 16 | Ht 63.0 in | Wt 153.5 lb

## 2013-08-24 DIAGNOSIS — R110 Nausea: Secondary | ICD-10-CM

## 2013-08-24 DIAGNOSIS — Z23 Encounter for immunization: Secondary | ICD-10-CM

## 2013-08-24 DIAGNOSIS — D5700 Hb-SS disease with crisis, unspecified: Secondary | ICD-10-CM

## 2013-08-24 DIAGNOSIS — T3995XD Adverse effect of unspecified nonopioid analgesic, antipyretic and antirheumatic, subsequent encounter: Secondary | ICD-10-CM

## 2013-08-24 MED ORDER — OXYCODONE HCL 10 MG PO TABS: 10.0000 mg | ORAL_TABLET | ORAL | Status: DC | PRN

## 2013-08-24 MED ORDER — PROMETHAZINE HCL 12.5 MG PO TABS: 12.5000 mg | ORAL_TABLET | Freq: Four times a day (QID) | ORAL | Status: DC | PRN

## 2013-08-24 NOTE — Progress Notes (Unsigned)
   Subjective:    Patient ID: Dawn [name], female    DOB: 07/21/1981, 32 y.o.   MRN: [id number]  HPI: Pt here for follow-up post-hospital visit. Pt states that since she has     Review of Systems     Objective:   Physical Exam        Assessment & Plan:  1. Hb SS without crisis:  2. Psychosocial: Pt has been in counseling.   3. 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 Repeat BC negative. Port-o-cath removed. Need to make arrangements for port-o-cath to be replaced after she has been seen by Dr. [name] tomorrow.  4. Immunizations: Pt had influenza vaccine while hospitalized and will receive TDAP today.  5.

## 2013-08-25 ENCOUNTER — Inpatient Hospital Stay: Payer: Self-pay | Admitting: Internal Medicine

## 2013-08-26 ENCOUNTER — Encounter: Payer: Self-pay | Admitting: Internal Medicine

## 2013-08-26 ENCOUNTER — Ambulatory Visit (INDEPENDENT_AMBULATORY_CARE_PROVIDER_SITE_OTHER): Payer: Medicare Other | Admitting: Internal Medicine

## 2013-08-26 ENCOUNTER — Ambulatory Visit: Payer: BC Managed Care – PPO | Admitting: Internal Medicine

## 2013-08-26 VITALS — BP 120/77 | HR 109 | Temp 98.6°F | Ht 63.0 in | Wt 153.0 lb

## 2013-08-26 DIAGNOSIS — B49 Unspecified mycosis: Secondary | ICD-10-CM

## 2013-08-26 NOTE — Assessment & Plan Note (Signed)
She is doing well and has completed treatment with no concerns for eye disease or endocarditis at this time. From infectious disease standpoint, she can have a new Port-A-Cath placed if needed.  She will followup here on a p.r.n. basis

## 2013-08-26 NOTE — Progress Notes (Signed)
   Subjective:    Patient ID: Dawn [name], female    DOB: [birth year], 32 y.o.   MRN: [id number]  HPI 4 evaluation as a new patient after developing fungemia at any time hospital. She has a history of sickle cell disease and is managed by [name] at [facility] 2 long sickle cell clinic. She has a chronic port that she uses for sickle cell crises and presented to  [facility] with back pain and chills. Her blood culture did grow out Candida in 2/3 as well as the tip of the PICC line. After initially having antibacterial she was changed to micafungin and sent home out on fluconazole for a three-week course with removal of her Port-A-Cath.     She is here today having completed her therapy. She is having no fever, no chills, no vision problems or new issues. She had no problems completing her Diflucan. Repeat blood cultures were negative   Review of Systems  Constitutional: Negative for fever and chills.  Eyes: Negative for visual disturbance.  Respiratory: Negative for shortness of breath.   Cardiovascular: Negative for chest pain.  Gastrointestinal: Negative for nausea and diarrhea.  Musculoskeletal: Negative for joint swelling.  Skin: Negative for rash.  Neurological: Negative for dizziness and light-headedness.       Objective:   Physical Exam  Constitutional: She is oriented to person, place, and time. She appears well-developed and well-nourished. No distress.  HENT:  Mouth/Throat: No oropharyngeal exudate.  Eyes: No scleral icterus.  Cardiovascular: Normal rate, regular rhythm and normal heart sounds.   No murmur heard. Pulmonary/Chest: Effort normal and breath sounds normal. No respiratory distress. She has no wheezes.  Lymphadenopathy:    She has no cervical adenopathy.  Neurological: She is alert and oriented to person, place, and time.  Skin:  Scars from previous Port-A-Cath areas on her chest          Assessment & Plan:

## 2013-09-07 ENCOUNTER — Telehealth (HOSPITAL_COMMUNITY): Payer: Self-pay | Admitting: Hematology

## 2013-09-07 NOTE — Telephone Encounter (Signed)
Called patient and advised that the office will be closed on Dec. 25-26, and Jan 1-2, so if she is in need of any refills to get them in early so they can get processed.  Patient verbalizes understanding.

## 2013-09-21 ENCOUNTER — Other Ambulatory Visit (HOSPITAL_COMMUNITY): Payer: Self-pay | Admitting: *Deleted

## 2013-09-21 ENCOUNTER — Other Ambulatory Visit: Payer: Self-pay | Admitting: Hematology

## 2013-09-21 DIAGNOSIS — D571 Sickle-cell disease without crisis: Secondary | ICD-10-CM

## 2013-09-21 MED ORDER — HYDROXYUREA 500 MG PO CAPS: 500.0000 mg | ORAL_CAPSULE | ORAL | Status: DC

## 2013-09-21 NOTE — Telephone Encounter (Signed)
Patient wants hydrea refilled

## 2013-09-21 NOTE — Telephone Encounter (Addendum)
Noted previous refill attempt was printed. Called in refill for hydrea, 46 capsules with one refill.

## 2013-11-16 ENCOUNTER — Other Ambulatory Visit: Payer: Self-pay

## 2013-11-16 ENCOUNTER — Other Ambulatory Visit: Payer: BC Managed Care – PPO

## 2013-11-16 ENCOUNTER — Telehealth: Payer: Self-pay | Admitting: Internal Medicine

## 2013-11-16 DIAGNOSIS — R110 Nausea: Secondary | ICD-10-CM

## 2013-11-16 DIAGNOSIS — D571 Sickle-cell disease without crisis: Secondary | ICD-10-CM

## 2013-11-16 DIAGNOSIS — D5700 Hb-SS disease with crisis, unspecified: Secondary | ICD-10-CM

## 2013-11-16 LAB — COMPREHENSIVE METABOLIC PANEL
ALT: 32 U/L (ref 0–35)
AST: 64 U/L — ABNORMAL HIGH (ref 0–37)
Albumin: 3.9 g/dL (ref 3.5–5.2)
Alkaline Phosphatase: 138 U/L — ABNORMAL HIGH (ref 39–117)
BUN: 5 mg/dL — ABNORMAL LOW (ref 6–23)
CALCIUM: 8.8 mg/dL (ref 8.4–10.5)
CHLORIDE: 102 meq/L (ref 96–112)
CO2: 27 meq/L (ref 19–32)
Creat: 0.45 mg/dL — ABNORMAL LOW (ref 0.50–1.10)
GLUCOSE: 96 mg/dL (ref 70–99)
Potassium: 3.6 mEq/L (ref 3.5–5.3)
SODIUM: 140 meq/L (ref 135–145)
TOTAL PROTEIN: 7.6 g/dL (ref 6.0–8.3)
Total Bilirubin: 7.2 mg/dL — ABNORMAL HIGH (ref 0.2–1.2)

## 2013-11-16 LAB — CBC WITH DIFFERENTIAL/PLATELET
Basophils Absolute: 0 10*3/uL (ref 0.0–0.1)
Basophils Relative: 0 % (ref 0–1)
EOS PCT: 0 % (ref 0–5)
Eosinophils Absolute: 0 10*3/uL (ref 0.0–0.7)
HCT: 25.8 % — ABNORMAL LOW (ref 36.0–46.0)
HEMOGLOBIN: 9.5 g/dL — AB (ref 12.0–15.0)
LYMPHS ABS: 2 10*3/uL (ref 0.7–4.0)
LYMPHS PCT: 43 % (ref 12–46)
MCH: 42 pg — ABNORMAL HIGH (ref 26.0–34.0)
MCHC: 36.8 g/dL — ABNORMAL HIGH (ref 30.0–36.0)
MCV: 114.2 fL — AB (ref 78.0–100.0)
MONOS PCT: 14 % — AB (ref 3–12)
Monocytes Absolute: 0.7 10*3/uL (ref 0.1–1.0)
Neutro Abs: 2 10*3/uL (ref 1.7–7.7)
Neutrophils Relative %: 43 % (ref 43–77)
PLATELETS: 249 10*3/uL (ref 150–400)
RBC: 2.26 MIL/uL — AB (ref 3.87–5.11)
RDW: 18.6 % — ABNORMAL HIGH (ref 11.5–15.5)
WBC: 4.7 10*3/uL (ref 4.0–10.5)

## 2013-11-16 LAB — FERRITIN: Ferritin: 937 ng/mL — ABNORMAL HIGH (ref 10–291)

## 2013-11-16 LAB — RETICULOCYTES
ABS Retic: 325.4 10*3/uL — ABNORMAL HIGH (ref 19.0–186.0)
RBC.: 2.26 MIL/uL — ABNORMAL LOW (ref 3.87–5.11)
RETIC CT PCT: 14.4 % — AB (ref 0.4–2.3)

## 2013-11-16 MED ORDER — HYDROXYUREA 500 MG PO CAPS: 500.0000 mg | ORAL_CAPSULE | ORAL | Status: DC

## 2013-11-16 MED ORDER — OXYCODONE HCL 10 MG PO TABS: 10.0000 mg | ORAL_TABLET | ORAL | Status: DC | PRN

## 2013-11-16 MED ORDER — PROMETHAZINE HCL 12.5 MG PO TABS: 12.5000 mg | ORAL_TABLET | Freq: Four times a day (QID) | ORAL | Status: DC | PRN

## 2013-11-16 NOTE — Telephone Encounter (Signed)
Refill request for Oxycodone HCl 10 MG TABS  

## 2013-11-16 NOTE — Telephone Encounter (Signed)
Prescription refilled for Oxycodone 10 mg # 60.

## 2013-11-23 ENCOUNTER — Ambulatory Visit (INDEPENDENT_AMBULATORY_CARE_PROVIDER_SITE_OTHER): Payer: Medicare Other | Admitting: Internal Medicine

## 2013-11-23 ENCOUNTER — Encounter: Payer: Self-pay | Admitting: Internal Medicine

## 2013-11-23 VITALS — BP 126/49 | HR 94 | Temp 98.2°F | Resp 20 | Ht 63.0 in | Wt 155.0 lb

## 2013-11-23 DIAGNOSIS — D571 Sickle-cell disease without crisis: Secondary | ICD-10-CM

## 2013-11-23 DIAGNOSIS — E559 Vitamin D deficiency, unspecified: Secondary | ICD-10-CM | POA: Diagnosis not present

## 2013-11-23 MED ORDER — VITAMIN D (ERGOCALCIFEROL) 1.25 MG (50000 UNIT) PO CAPS: 50000.0000 [IU] | ORAL_CAPSULE | ORAL | Status: DC

## 2013-11-23 MED ORDER — FOLIC ACID 1 MG PO TABS: 1.0000 mg | ORAL_TABLET | Freq: Every day | ORAL | Status: DC

## 2013-11-23 NOTE — Progress Notes (Signed)
Subjective:    Patient ID: Dawn [name], female    DOB: May 04, 1981, 33 y.o.   MRN: [id number]  HPI: Pt states that she has been doing well.  She has been staying out of the cold and states that she has been compliant with Hydrea and Folic acid. She reports that her pain has been well controlled with her current medications. She appears well today. She states that she has not been taking her Exjade as consistently as she should and is requesting that she have a drug holiday from the Exjade as her Ferritin levels are now below 1000.  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    Review of Systems  Constitutional: Negative.   HENT: Negative.   Eyes: Negative.   Respiratory: Negative.   Cardiovascular: Negative.   Gastrointestinal: Negative.   Endocrine: Negative.   Genitourinary: Negative.   Musculoskeletal: Positive for myalgias.  Skin: Negative.   Allergic/Immunologic: Negative.   Neurological: Negative.   Hematological: Negative.   Psychiatric/Behavioral: Negative.        Objective:   Physical Exam  Constitutional: She is oriented to person, place, and time. She appears well-developed and well-nourished.  HENT:  Head: Normocephalic and atraumatic.  Eyes: Conjunctivae and EOM are normal. Pupils are equal, round, and reactive to light. No scleral icterus.  Neck: Normal range of motion. Neck supple. No JVD present. No thyromegaly present.  Cardiovascular: Normal rate and regular rhythm.  Exam reveals no gallop and no friction rub.   No murmur heard. Pulmonary/Chest: Effort normal and breath sounds normal. She has no wheezes. She has no rales.  Abdominal: Soft. Bowel sounds are normal. She exhibits no distension and no mass. There is no tenderness.  Musculoskeletal: Normal range of motion.  Neurological: She is alert and oriented to person, place, and time. No cranial nerve deficit.  Skin: Skin is warm and dry.  Psychiatric: She has a normal mood  and affect. Her behavior is normal. Judgment and thought content normal.          Assessment & Plan:  1. Hb SS: 1. Sickle cell disease - Continue Hydrea 500 mg and 1000 mg every other day. We discussed the need for good hydration, monitoring of hydration status, avoidance of heat, cold, stress, and infection triggers. We discussed the risks and benefits of Hydrea, including bone marrow suppression, the possibility of GI upset, skin ulcers, hair thinning, and teratogenicity. The patient was reminded of the need to seek medical attention of any symptoms of bleeding, anemia, or infection. Continue folic acid 1 mg daily to prevent aplastic bone marrow crises.   2. Pulmonary evaluation - Patient denies severe recurrent wheezes, shortness of breath with exercise, or persistent cough. If these symptoms develop, pulmonary function tests with spirometry will be ordered, and if abnormal, plan on referral to Pulmonology for further evaluation.  3. Cardiac - Routine screening for pulmonary hypertension is not recommended.  4. Eye - High risk of proliferative retinopathy. Annual eye exam with retinal exam recommended to patient in April.  5. Immunization status - She declines vaccines today. Yearly influenza vaccination is recommended, as well as being up to date with Meningococcal and Pneumococcal vaccines.   6. Acute and chronic painful episodes - We agreed on (Opiate dose and amount of pills  per month) plan on titrating her Medication dose. We discussed that she is to receive her Schedule II prescriptions only from [place]. She is also aware that her  prescription history is available to [place] online through the [employer]. Controlled substance agreement signed (Date). We reminded (Pt) that all patients receiving Schedule II narcotics must be seen for follow up every three months. We reviewed the terms of our pain agreement, including the need to keep medicines in a safe locked location away from children or pets, and  the need to report excess sedation or constipation, measures to avoid constipation, and policies related to early refills and stolen prescriptions. According to the [place] [employer] program, we have reviewed details related to analgesia, adverse effects, aberrant behaviors.  7. Iron overload from chronic transfusion.  Last ferritin 937. She is requesting a drug holiday from  Exjade for now. Will hold Exjade for now and then recheck in 2 months.   8. Vitamin D deficiency - Continue Drisdol 50,000 units weekly..   The above recommendations are taken from the NIH Evidence-Based Management of Sickle Cell Disease: Expert Panel Report, 20149.   RTC: 1 week for pap and pelvic examination.

## 2013-11-25 ENCOUNTER — Telehealth: Payer: Self-pay | Admitting: Internal Medicine

## 2013-11-25 ENCOUNTER — Other Ambulatory Visit: Payer: Self-pay | Admitting: Hematology

## 2013-11-25 DIAGNOSIS — D571 Sickle-cell disease without crisis: Secondary | ICD-10-CM

## 2013-11-25 DIAGNOSIS — R110 Nausea: Secondary | ICD-10-CM

## 2013-11-25 MED ORDER — HYDROXYUREA 500 MG PO CAPS: 500.0000 mg | ORAL_CAPSULE | ORAL | Status: DC

## 2013-11-25 MED ORDER — PROMETHAZINE HCL 12.5 MG PO TABS: 12.5000 mg | ORAL_TABLET | Freq: Four times a day (QID) | ORAL | Status: DC | PRN

## 2013-11-25 NOTE — Telephone Encounter (Signed)
Patient called stating RX for Phenergan and Hydrea have not been received by pharmacy.

## 2013-11-30 ENCOUNTER — Other Ambulatory Visit: Payer: Self-pay | Admitting: Family Medicine

## 2013-11-30 ENCOUNTER — Encounter: Payer: Self-pay | Admitting: Family Medicine

## 2013-11-30 ENCOUNTER — Ambulatory Visit (INDEPENDENT_AMBULATORY_CARE_PROVIDER_SITE_OTHER): Payer: Medicare Other | Admitting: Family Medicine

## 2013-11-30 VITALS — BP 130/57 | HR 91 | Temp 98.6°F | Resp 18 | Ht 63.0 in | Wt 155.0 lb

## 2013-11-30 DIAGNOSIS — Z0000 Encounter for general adult medical examination without abnormal findings: Secondary | ICD-10-CM | POA: Diagnosis not present

## 2013-11-30 DIAGNOSIS — E559 Vitamin D deficiency, unspecified: Secondary | ICD-10-CM | POA: Diagnosis not present

## 2013-11-30 DIAGNOSIS — D571 Sickle-cell disease without crisis: Secondary | ICD-10-CM

## 2013-11-30 NOTE — Progress Notes (Signed)
   Subjective:    Patient ID: Dawn [name], female    DOB: 11/26/1980, 33 y.o.   MRN: [id number]  HPI Patient presents for a complete physical examination with pap smear. Reports that she currently feels well and denies any issues.   Review of Systems  Constitutional: Negative.   HENT: Negative.   Respiratory: Negative.   Cardiovascular: Negative.   Gastrointestinal: Negative.   Endocrine: Negative.   Genitourinary: Negative.   Musculoskeletal: Negative.   Skin: Negative.   Allergic/Immunologic: Negative.   Neurological: Negative.   Psychiatric/Behavioral: Negative.        Objective:   Physical Exam  Constitutional: She is oriented to person, place, and time. She appears well-developed and well-nourished.  HENT:  Head: Normocephalic and atraumatic.  Eyes: Conjunctivae are normal. Pupils are equal, round, and reactive to light.  Neck: Normal range of motion. Neck supple. Normal carotid pulses and no JVD present. Carotid bruit is not present. No mass present.  Cardiovascular: Normal rate, regular rhythm, normal heart sounds and normal pulses.   Pulmonary/Chest: Effort normal and breath sounds normal. No respiratory distress. She exhibits no mass, no tenderness and no swelling. Right breast exhibits no inverted nipple, no mass, no nipple discharge, no skin change and no tenderness. Left breast exhibits no inverted nipple, no mass, no nipple discharge, no skin change and no tenderness. Breasts are symmetrical.  Abdominal: Soft. Bowel sounds are normal.  Genitourinary: Rectum normal and uterus normal. No breast swelling, tenderness, discharge or bleeding. Pelvic exam was performed with patient prone. Cervix exhibits no motion tenderness, no discharge and no friability. Right adnexum displays no fullness. Left adnexum displays no fullness. No bleeding around the vagina.  Musculoskeletal: Normal range of motion.  Neurological: She is alert and oriented to person, place, and time.  Skin:  Skin is warm and dry.  Psychiatric: She has a normal mood and affect. Her speech is normal and behavior is normal. Thought content normal.          Assessment & Plan:  1. Sickle cell disease -  Currently stable. Continue Hydrea 500 mg twice daily. We discussed the need for good hydration, monitoring of hydration status, avoidance of heat, cold, stress, and infection triggers. We discussed the risks and benefits of Hydrea, including bone marrow suppression, the possibility of GI upset, skin ulcers, hair thinning, and teratogenicity. The patient was reminded of the need to seek medical attention of any symptoms of bleeding, anemia, or infection. Continue folic acid 1 mg daily to prevent aplastic bone marrow crises. Reviewed laboratory results from 11/16/2013.  2. Cardiac - Routine screening for pulmonary hypertension is not recommended.   3. Eye - High risk of proliferative retinopathy. Annual eye exam with retinal exam recommended to patient. Patient to schedule opthalmology visit with Dr. Jessie Foot at [facility] for April.   4.  Immunization status - She is up to date with all vaccinations  5.  Vitamin D deficiency - Drisdol 50,000 units weekly, we encouraged her to take it.  6. Dental: Patient reports that she goes to dentist twice early  59. Contraception: Patient reports that she has had the Mirena intrauterine device that was initially inserted in 2008, patient had device replaced in 2012. Maintains that she does not have a menstrual cycle.    RTC: 3 months

## 2013-12-01 LAB — PAP IG (IMAGE GUIDED)

## 2014-01-21 ENCOUNTER — Telehealth: Payer: Self-pay

## 2014-01-21 NOTE — Telephone Encounter (Signed)
Pt contacted as requested VM was left  asking Pt to contact office about her Oxygen Usage as well as if a Pluse Ox is needed at this time for home use.

## 2014-01-25 ENCOUNTER — Telehealth: Payer: Self-pay

## 2014-01-25 NOTE — Telephone Encounter (Signed)
2nd Ph.Call placed to Ms.Sherk to return VM msg concerning her Oxygen Usage.Another call will be placed toward end of day

## 2014-01-28 ENCOUNTER — Telehealth: Payer: Self-pay

## 2014-01-28 NOTE — Telephone Encounter (Signed)
Pt returned call to clinic and Pt states that she is no longer using [place] well as she states she no longer uses the Oxygen.Pt states she has contacted [facility] and they are aware to come to pick-up equipment.

## 2014-01-28 NOTE — Telephone Encounter (Signed)
3rd call was placed for Pt to plz contact office concerning Oxygen that she recieves through [place].Letter shall be placed in mail if last attempted Ph call is not returned today

## 2014-02-04 ENCOUNTER — Telehealth: Payer: Self-pay

## 2014-02-04 NOTE — Telephone Encounter (Signed)
Spoke w/ [facility] this A.M, Office states that they have been having a hard time contacting Dawn [name] about Oxygen.When last spoke to Pt. she stated she was no longer using it.and had contacted office to pick up.[facility] states she has not called.Is there any way you can type up a Discont. Letter so that I may fax over to office stating she no longer in need of this? Plz Advise

## 2014-02-05 ENCOUNTER — Encounter: Payer: Self-pay | Admitting: Internal Medicine

## 2014-02-05 NOTE — Telephone Encounter (Signed)
Spoke with [place] and advised that [name] is working and that she has not had use for her Oxygen since within a week of her last admission to [facility] in November. On her visit to the office on 11/23/2013, patient was not in need of oxygen. I will send a letter to [place] discontinuing Oxygen.

## 2014-02-08 ENCOUNTER — Telehealth: Payer: Self-pay

## 2014-02-08 NOTE — Telephone Encounter (Signed)
[facility] was faxed Letter of Discont.on 02/05/2014 per their request.Letter was Pertaining to [name] an her Discont of Oxygen usage.

## 2014-03-01 ENCOUNTER — Emergency Department (HOSPITAL_COMMUNITY)
Admission: EM | Admit: 2014-03-01 | Discharge: 2014-03-01 | Disposition: A | Discharge status: Home / Self Care | Payer: Medicare Other | Attending: Emergency Medicine | Admitting: Emergency Medicine

## 2014-03-01 ENCOUNTER — Encounter (HOSPITAL_COMMUNITY): Payer: Self-pay | Admitting: Emergency Medicine

## 2014-03-01 ENCOUNTER — Emergency Department (HOSPITAL_COMMUNITY): Payer: Medicare Other

## 2014-03-01 DIAGNOSIS — Z79899 Other long term (current) drug therapy: Secondary | ICD-10-CM | POA: Diagnosis not present

## 2014-03-01 DIAGNOSIS — D5700 Hb-SS disease with crisis, unspecified: Secondary | ICD-10-CM | POA: Diagnosis not present

## 2014-03-01 DIAGNOSIS — Z3202 Encounter for pregnancy test, result negative: Secondary | ICD-10-CM | POA: Insufficient documentation

## 2014-03-01 DIAGNOSIS — Z8701 Personal history of pneumonia (recurrent): Secondary | ICD-10-CM | POA: Insufficient documentation

## 2014-03-01 DIAGNOSIS — R072 Precordial pain: Secondary | ICD-10-CM | POA: Diagnosis not present

## 2014-03-01 DIAGNOSIS — Z91040 Latex allergy status: Secondary | ICD-10-CM | POA: Diagnosis not present

## 2014-03-01 DIAGNOSIS — Z8659 Personal history of other mental and behavioral disorders: Secondary | ICD-10-CM | POA: Diagnosis not present

## 2014-03-01 DIAGNOSIS — D571 Sickle-cell disease without crisis: Secondary | ICD-10-CM | POA: Diagnosis present

## 2014-03-01 LAB — COMPREHENSIVE METABOLIC PANEL
ALT: 39 U/L — AB (ref 0–35)
AST: 79 U/L — ABNORMAL HIGH (ref 0–37)
Albumin: 3.3 g/dL — ABNORMAL LOW (ref 3.5–5.2)
Alkaline Phosphatase: 147 U/L — ABNORMAL HIGH (ref 39–117)
BILIRUBIN TOTAL: 8 mg/dL — AB (ref 0.3–1.2)
BUN: 4 mg/dL — ABNORMAL LOW (ref 6–23)
CHLORIDE: 103 meq/L (ref 96–112)
CO2: 22 meq/L (ref 19–32)
Calcium: 8.4 mg/dL (ref 8.4–10.5)
Creatinine, Ser: 0.47 mg/dL — ABNORMAL LOW (ref 0.50–1.10)
GFR calc Af Amer: >90 mL/min (ref >90)
GLUCOSE: 89 mg/dL (ref 70–99)
POTASSIUM: 3.5 meq/L — AB (ref 3.7–5.3)
SODIUM: 140 meq/L (ref 137–147)
TOTAL PROTEIN: 7.6 g/dL (ref 6.0–8.3)

## 2014-03-01 LAB — CBC WITH DIFFERENTIAL/PLATELET
BASOS ABS: 0 10*3/uL (ref 0.0–0.1)
Basophils Relative: 0 % (ref 0–1)
EOS ABS: 0 10*3/uL (ref 0.0–0.7)
Eosinophils Relative: 0 % (ref 0–5)
HEMATOCRIT: 23.1 % — AB (ref 36.0–46.0)
Hemoglobin: 8.6 g/dL — ABNORMAL LOW (ref 12.0–15.0)
LYMPHS PCT: 15 % (ref 12–46)
Lymphs Abs: 1.6 10*3/uL (ref 0.7–4.0)
MCH: 40.8 pg — ABNORMAL HIGH (ref 26.0–34.0)
MCHC: 37.1 g/dL — AB (ref 30.0–36.0)
MCV: 109.5 fL — ABNORMAL HIGH (ref 78.0–100.0)
Monocytes Absolute: 1.4 10*3/uL — ABNORMAL HIGH (ref 0.1–1.0)
Monocytes Relative: 13 % — ABNORMAL HIGH (ref 3–12)
NEUTROS ABS: 7.8 10*3/uL — AB (ref 1.7–7.7)
Neutrophils Relative %: 72 % (ref 43–77)
PLATELETS: 262 10*3/uL (ref 150–400)
RBC: 2.11 MIL/uL — ABNORMAL LOW (ref 3.87–5.11)
RDW: 15.5 % (ref 11.5–15.5)
WBC: 10.8 10*3/uL — ABNORMAL HIGH (ref 4.0–10.5)

## 2014-03-01 LAB — I-STAT CG4 LACTIC ACID, ED: Lactic Acid, Venous: 0.74 mmol/L (ref 0.5–2.2)

## 2014-03-01 LAB — URINE MICROSCOPIC-ADD ON

## 2014-03-01 LAB — URINALYSIS, ROUTINE W REFLEX MICROSCOPIC
BILIRUBIN URINE: NEGATIVE
Glucose, UA: NEGATIVE mg/dL
Ketones, ur: NEGATIVE mg/dL
Leukocytes, UA: NEGATIVE
Nitrite: NEGATIVE
Protein, ur: 100 mg/dL — AB
Specific Gravity, Urine: 1.015 (ref 1.005–1.030)
UROBILINOGEN UA: 0.2 mg/dL (ref 0.0–1.0)
pH: 6.5 (ref 5.0–8.0)

## 2014-03-01 LAB — RETICULOCYTES
RBC.: 2.11 MIL/uL — AB (ref 3.87–5.11)
RETIC CT PCT: 18.2 % — AB (ref 0.4–3.1)
Retic Count, Absolute: 384 10*3/uL — ABNORMAL HIGH (ref 19.0–186.0)

## 2014-03-01 LAB — POC URINE PREG, ED: PREG TEST UR: NEGATIVE

## 2014-03-01 MED ORDER — HYDROMORPHONE HCL PF 1 MG/ML IJ SOLN
1.0000 mg | Freq: Once | INTRAMUSCULAR | Status: AC
  Administered 2014-03-01: 1 mg via INTRAVENOUS
  Filled 2014-03-01: qty 1

## 2014-03-01 MED ORDER — SODIUM CHLORIDE 0.9 % IV BOLUS (SEPSIS)
1000.0000 mL | Freq: Once | INTRAVENOUS | Status: AC
  Administered 2014-03-01: 1000 mL via INTRAVENOUS

## 2014-03-01 MED ORDER — ACETAMINOPHEN 325 MG PO TABS
650.0000 mg | ORAL_TABLET | Freq: Once | ORAL | Status: AC
  Administered 2014-03-01: 650 mg via ORAL
  Filled 2014-03-01: qty 2

## 2014-03-01 MED ORDER — PROMETHAZINE HCL 12.5 MG PO TABS
25.0000 mg | ORAL_TABLET | Freq: Once | ORAL | Status: AC
  Administered 2014-03-01: 25 mg via ORAL
  Filled 2014-03-01: qty 2

## 2014-03-01 NOTE — Discharge Instructions (Signed)
Sickle Cell Anemia, Adult °Sickle cell anemia is a condition where your red blood cells are shaped like sickles. Red blood cells carry oxygen through the body. Sickle-shaped red blood cells cells do not live as long as normal red blood cells. They also clump together and block blood from flowing through the blood vessels. These things prevent the body from getting enough oxygen. Sickle cell anemia causes organ damage and pain. It also increases the risk of infection. °HOME CARE °· Drink enough fluid to keep your pee (urine) clear or pale yellow. Drink more in hot weather and during exercise. °· Do not smoke. Smoking lowers oxygen levels in the blood. °· Only take over-the-counter or prescription medicines as told by your doctor. °· Take antibiotic medicines as told by your doctor. Make sure you finish them it even if you start to feel better. °· Take supplements as told by your doctor. °· Consider wearing a medical alert bracelet. This tells anyone caring for you in an emergency of your condition. °· When traveling, keep your medical information, doctors' names, and the medicines you take with you at all times. °· If you have a fever, do not take fever medicines right away. This could cover up a problem. Tell your doctor. °·  Keep all follow-up appointments with your doctor. Sickle cell anemia requires regular medical care. °GET HELP IF: °You have a fever. °GET HELP RIGHT AWAY IF: °· You feel dizzy or faint. °· You have new belly (abdominal) pain, especially on the left side near the stomach area. °· You have a lasting, often uncomfortable and painful erection of the penis (priapism). If it is not treated right away, you will become unable to have sex (impotence). °· You have numbness your arms or legs or you have a hard time moving them. °· You have a hard time talking. °· You have a fever or lasting symptoms for more than 2 3 days. °· You have a fever and your symptoms suddenly get worse. °· You have signs or  symptoms of infection. These include: °· Chills. °· Being more tired than normal (lethargy). °· Irritability. °· Poor eating. °· Throwing up (vomiting). °· You have pain that is not helped with medicine. °· You have shortness of breath. °· You have pain in your chest. °· You are coughing up pus-like or bloody mucus. °· You have a stiff neck. °· Your feet or hands swell or have pain. °· Your belly looks bloated. °· Your joints hurt. °MAKE SURE YOU: °· Understand these instructions. °· Will watch your condition. °· Will get help right away if you are not doing well or get worse. °Document Released: 07/01/2013 Document Reviewed: 04/22/2013 °ExitCare® Patient Information ©2014 [employer]. ° °

## 2014-03-01 NOTE — ED Notes (Signed)
Complain of aching all over. States she has sickle cell

## 2014-03-01 NOTE — ED Provider Notes (Signed)
CSN: [id number]     Arrival date & time 03/01/14  1053 History   First MD Initiated Contact with Patient 03/01/14 1133     Chief Complaint  Patient presents with  . Joint Pain   The history is provided by the patient. No language interpreter was used.   This chart was scribed for Sharyon Cable, MD by Thea Alken, ED Scribe. This patient was seen in room APA18/APA18 and the patient's care was started at 11:34 AM.  HPI Comments:  Dawn [name] is a 33 y.o. female who has sickle cell anemia present to the Emergency Department complaining of joint pain located to legs, neck, back and chest. Pt states she has had this problem in the past. She denies emesis, diarrhea, dysuria She reports pain is worsening.  Nothing improves her symptoms   Past Medical History  Diagnosis Date  . Sickle cell disease   . Sickle cell disease, type S   . Blood transfusion   . 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 HCAP (healthcare-associated pneumonia) 05/19/2013   Past Surgical History  Procedure Laterality Date  . Cholecystectomy    .  left knee acl reconstruction    . Cesarean section      x 2  . Portacath placement    . Portacath placement Left 02/23/2013    Procedure: INSERTION PORT-A-CATH;  Surgeon: Donato Heinz, MD;  Location: [facility] ORS;  Service: General;  Laterality: Left;  . Port-a-cath removal Left 07/29/2013    Procedure: REMOVAL PORT-A-CATH;  Surgeon: Scherry Ran, MD;  Location: [facility] ORS;  Service: General;  Laterality: Left;  . Tee without cardioversion N/A 08/07/2013    Procedure: TRANSESOPHAGEAL ECHOCARDIOGRAM (TEE);  Surgeon: Arnoldo Lenis, MD;  Location: [facility] END[facility] SUITE;  Service: Cardiology;  Laterality: N/A;   Family History  Problem Relation Age of Onset  . Sickle cell trait Mother   . Sickle cell trait Father   . Hypertension Father   . Diabetes Father   . Sickle cell trait Sister   . Cancer Paternal Aunt     Breast   History  Substance Use Topics  . Smoking  status: Never Smoker   . Smokeless tobacco: Not on file  . Alcohol Use: No   OB History   Grav Para Term Preterm Abortions TAB SAB Ect Mult Living                 Review of Systems  Constitutional: Positive for fever.  Respiratory: Negative for shortness of breath.   Gastrointestinal: Negative for nausea, vomiting, diarrhea and constipation.  Musculoskeletal: Positive for arthralgias, back pain, myalgias and neck pain.  Neurological: Negative for weakness.  All other systems reviewed and are negative.     Allergies  Desferal; Latex; Lisinopril; and Tape  Home Medications   Prior to Admission medications   Medication Sig Start Date End Date Taking? Authorizing Provider  cetirizine (ZYRTEC) 10 MG tablet Take 1 tablet (10 mg total) by mouth daily as needed. For allergies 07/21/12   Luvenia Heller, FNP  deferasirox (EXJADE) 500 MG disintegrating tablet Take 1,500 mg by mouth daily before breakfast.    Historical Provider, MD  folic acid (FOLVITE) 1 MG tablet Take 1 tablet (1 mg total) by mouth daily. 11/23/13   Leana Gamer, MD  hydroxyurea (HYDREA) 500 MG capsule Take 1-2 capsules (500-1,000 mg total) by mouth as directed. Take 500mg  (1 tablet) ON ODD DAYS AND 1000 MG (2) TABLETS ON THE EVEN DAYS.  11/25/13   Leana Gamer, MD  Oxycodone HCl 10 MG TABS Take 1 tablet (10 mg total) by mouth every 4 (four) hours as needed. 11/16/13   Leana Gamer, MD  promethazine (PHENERGAN) 12.5 MG tablet Take 1 tablet (12.5 mg total) by mouth every 6 (six) hours as needed for nausea. 1-2 prn begin with 1 if no relief may repeat x's 1 11/25/13   Leana Gamer, MD  Vitamin D, Ergocalciferol, (DRISDOL) 50000 UNITS CAPS capsule Take 1 capsule (50,000 Units total) by mouth every 7 (seven) days. Takes on Sundays. 11/23/13   Leana Gamer, MD   BP 145/73  Pulse 117  Temp(Src) 101.8 F (38.8 C) (Oral)  Resp 20  Ht 5\' 3"  (1.6 m)  Wt 155 lb (70.308 kg)  BMI 27.46 kg/m2  SpO2  95% Physical Exam CONSTITUTIONAL: Well developed/well nourished HEAD: Normocephalic/atraumatic EYES: EOMI/PERRL ENMT: Mucous membranes moist NECK: supple no meningeal signs SPINE:entire spine nontender CV: S1/S2 noted, no murmurs/rubs/gallops noted LUNGS: Lungs are clear to auscultation bilaterally, no apparent distress ABDOMEN: soft, nontender, no rebound or guarding GU:no cva tenderness NEURO: Pt is awake/alert, moves all extremitiesx4 EXTREMITIES: pulses normal, full ROM SKIN: warm, color normal PSYCH: no abnormalities of mood noted  ED Course  Procedures 11:37 AM-Discussed treatment plan which includes UA with pt at bedside and pt agreed to plan.  11:47 AM D/w dr Zigmund Daniel, her PCP Knows patient well.  Would defer antibiotics until cxr/urine is resulted May need transfer to [facility] 3:06 PM Pt improved She wants to go home I spoke to her PCP Dr Rodena Piety Given labs are near baseline, pt improved will f/u in clinic Cultures have been sent.  Pt reports this similar to prior pain crisis.  PCP is comfortable with plan and reports she will spike fever with previous pain crisis BP 125/76  Pulse 103  Temp(Src) 101.8 F (38.8 C) (Oral)  Resp 18  Ht 5\' 3"  (1.6 m)  Wt 155 lb (70.308 kg)  BMI 27.46 kg/m2  SpO2 99%   3:56 PM Pt improved Stable for d/c home with close followup We discussed strict return precautions BP 119/67  Pulse 110  Temp(Src) 98.6 F (37 C) (Oral)  Resp 19  Ht 5\' 3"  (1.6 m)  Wt 155 lb (70.308 kg)  BMI 27.46 kg/m2  SpO2 96%   Labs Review Labs Reviewed  COMPREHENSIVE METABOLIC PANEL - Abnormal; Notable for the following:    Potassium 3.5 (*)    BUN 4 (*)    Creatinine, Ser 0.47 (*)    Albumin 3.3 (*)    AST 79 (*)    ALT 39 (*)    Alkaline Phosphatase 147 (*)    Total Bilirubin 8.0 (*)    All other components within normal limits  CBC WITH DIFFERENTIAL - Abnormal; Notable for the following:    WBC 10.8 (*)    RBC 2.11 (*)    Hemoglobin  8.6 (*)    HCT 23.1 (*)    MCV 109.5 (*)    MCH 40.8 (*)    MCHC 37.1 (*)    Monocytes Relative 13 (*)    Neutro Abs 7.8 (*)    Monocytes Absolute 1.4 (*)    All other components within normal limits  URINALYSIS, ROUTINE W REFLEX MICROSCOPIC - Abnormal; Notable for the following:    Hgb urine dipstick MODERATE (*)    Protein, ur 100 (*)    All other components within normal limits  RETICULOCYTES - Abnormal;  Notable for the following:    Retic Ct Pct 18.2 (*)    RBC. 2.11 (*)    Retic Count, Manual 384.0 (*)    All other components within normal limits  URINE MICROSCOPIC-ADD ON - Abnormal; Notable for the following:    Squamous Epithelial / LPF FEW (*)    All other components within normal limits  URINE CULTURE  CULTURE, BLOOD (ROUTINE X 2)  CULTURE, BLOOD (ROUTINE X 2)  I-STAT CG4 LACTIC ACID, ED  POC URINE PREG, ED    Imaging Review Dg Chest Portable 1 View  03/01/2014   CLINICAL DATA:  Sharp substernal chest pain.  EXAM: PORTABLE CHEST - 1 VIEW  COMPARISON:  08/03/2013  FINDINGS: Cardiac silhouette is normal in size. Mediastinum and hilar contours are unremarkable.  Lungs are clear.  No pleural effusion.  No pneumothorax.  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  IMPRESSION: No active cardiopulmonary disease.   Electronically Signed   By: Lajean Manes M.D.   On: 03/01/2014 11:56      MDM   Final diagnoses:  Sickle cell pain crisis    Nursing notes including past medical history and social history reviewed and considered in documentation xrays reviewed and considered Labs/vital reviewed and considered     Sharyon Cable, MD 03/01/14 1557

## 2014-03-01 NOTE — ED Notes (Signed)
Patient states fever started while at work this morning. Sickle cell patient. States fullness ("bloating") in abdomen. Alert/oriented x 4. States took Motrin at work PTA.

## 2014-03-02 ENCOUNTER — Encounter: Payer: Self-pay | Admitting: Family Medicine

## 2014-03-02 ENCOUNTER — Ambulatory Visit (INDEPENDENT_AMBULATORY_CARE_PROVIDER_SITE_OTHER): Payer: Medicare Other | Admitting: Family Medicine

## 2014-03-02 VITALS — BP 125/62 | HR 100 | Temp 98.0°F | Resp 20 | Wt 156.0 lb

## 2014-03-02 DIAGNOSIS — R110 Nausea: Secondary | ICD-10-CM

## 2014-03-02 DIAGNOSIS — D571 Sickle-cell disease without crisis: Secondary | ICD-10-CM | POA: Diagnosis not present

## 2014-03-02 LAB — URINE CULTURE
Colony Count: NO GROWTH
Culture: NO GROWTH

## 2014-03-02 MED ORDER — PROMETHAZINE HCL 12.5 MG PO TABS: 12.5000 mg | ORAL_TABLET | Freq: Four times a day (QID) | ORAL | Status: DC | PRN

## 2014-03-02 NOTE — Progress Notes (Signed)
Subjective:    Patient ID: Dawn [name], female    DOB: December 16, 1980, 33 y.o.   MRN: [id number]  HPI Patient with a history of sickle cell anemia HbSS presents to clinic for 3 month follow-up. Patient states that she was in the emergency room at [facility] on  03/01/2014 with a pain crisis to lower back. Pain was treated with IV dilaudid and IV fluids. She reports that her pain dissipated and she was discharged home. Patient states that her pain intensity is currently 5/10 to lower back described at intermittent, throbbing, and non-radiating. Patient attributed pain crisis to being dehydrated and working 12 hour shifts. She states that she has not required pain medications since leaving the hospital on 6/8. She reports that she cannot tolerate pain medications without nausea medication. Reports that she is taking maintenance medications consistently. Patient denies fatigue, chest pains, shortness of breath, nausea, vomiting and diarrhea.   Review of Systems  Constitutional: Negative.   HENT: Negative.   Eyes: Negative.   Respiratory: Negative.   Cardiovascular: Negative.   Gastrointestinal: Negative.   Endocrine: Negative.   Genitourinary: Negative.   Musculoskeletal: Positive for back pain and myalgias.  Skin: Negative.   Allergic/Immunologic: Negative.   Neurological: Negative.   Hematological: Negative.   Psychiatric/Behavioral: Negative.        Objective:   Physical Exam  Constitutional: She appears well-developed and well-nourished.  HENT:  Head: Normocephalic and atraumatic.  Eyes: Conjunctivae, EOM and lids are normal. Pupils are equal, round, and reactive to light. Lids are everted and swept, no foreign bodies found. Scleral icterus is present.  Neck: Trachea normal, normal range of motion, full passive range of motion without pain and phonation normal. Neck supple.  Pulmonary/Chest: Effort normal and breath sounds normal.  Abdominal: Soft. Normal appearance. There is  tenderness in the right upper quadrant.  Lymphadenopathy:       Head (right side): No submental and no submandibular adenopathy present.       Head (left side): No submental and no submandibular adenopathy present.       Right cervical: No superficial cervical and no deep cervical adenopathy present.      Left cervical: No superficial cervical and no deep cervical adenopathy present.  Neurological: She is alert. No cranial nerve deficit or sensory deficit.  Skin: Skin is warm, dry and intact.  Psychiatric: She has a normal mood and affect. Her speech is normal and behavior is normal. Judgment and thought content normal. Cognition and memory are normal.    BP 125/62  Pulse 100  Temp(Src) 98 F (36.7 C) (Oral)  Resp 20  Wt 156 lb (70.761 kg)      Assessment & Plan:  1. Sickle cell disease - Continue Hydrea 500 mg twice daily. We discussed the need for good hydration, monitoring of hydration status, avoidance of heat, cold, stress, and infection triggers. We discussed the risks and benefits of Hydrea, including bone marrow suppression, the possibility of GI upset, skin ulcers, hair thinning, and teratogenicity. The patient was reminded of the need to seek medical attention of any symptoms of bleeding, anemia, or infection. Continue folic acid 1 mg daily to prevent aplastic bone marrow crises. Patient reports that she is unable to take pain medications without an anti-emetic. Re-ordered Phenergan. Patient encouraged to take pain medications as directed.  Patient to schedule appt for hematologist at [facility] in 1 month.          2. Pulmonary evaluation - Patient  denies severe recurrent wheezes, shortness of breath with exercise, or persistent cough. If these symptoms develop, pulmonary function tests with spirometry will be ordered, and if abnormal, plan on referral to Pulmonology for further evaluation.  3. Cardiac - Routine screening for pulmonary hypertension is not recommended.  4. Eye - High  risk of proliferative retinopathy. Annual eye exam to r/o retinopathy was in March.  5. Immunization status - Vaccinations are up to date.    6. Vitamin D deficiency -  Continue Drisdol 50,000 units weekly   Labs: CBC w/ differential, CMP, hemoglobin electrophoresis  RTC: 3 months  [name], FNP

## 2014-03-06 LAB — CULTURE, BLOOD (ROUTINE X 2)
CULTURE: NO GROWTH
Culture: NO GROWTH

## 2014-03-28 ENCOUNTER — Encounter (HOSPITAL_COMMUNITY): Payer: Self-pay | Admitting: Emergency Medicine

## 2014-03-28 ENCOUNTER — Emergency Department (HOSPITAL_COMMUNITY)
Admission: EM | Admit: 2014-03-28 | Discharge: 2014-03-28 | Discharge status: Against Medical Advice | Payer: BC Managed Care – PPO | Attending: Emergency Medicine | Admitting: Emergency Medicine

## 2014-03-28 ENCOUNTER — Emergency Department (HOSPITAL_COMMUNITY): Payer: BC Managed Care – PPO

## 2014-03-28 DIAGNOSIS — Z8701 Personal history of pneumonia (recurrent): Secondary | ICD-10-CM | POA: Diagnosis not present

## 2014-03-28 DIAGNOSIS — Z91040 Latex allergy status: Secondary | ICD-10-CM | POA: Diagnosis not present

## 2014-03-28 DIAGNOSIS — D5700 Hb-SS disease with crisis, unspecified: Secondary | ICD-10-CM | POA: Insufficient documentation

## 2014-03-28 DIAGNOSIS — Z79899 Other long term (current) drug therapy: Secondary | ICD-10-CM | POA: Diagnosis not present

## 2014-03-28 DIAGNOSIS — Z8659 Personal history of other mental and behavioral disorders: Secondary | ICD-10-CM | POA: Insufficient documentation

## 2014-03-28 DIAGNOSIS — Z3202 Encounter for pregnancy test, result negative: Secondary | ICD-10-CM | POA: Diagnosis not present

## 2014-03-28 LAB — COMPREHENSIVE METABOLIC PANEL
ALBUMIN: 3.2 g/dL — AB (ref 3.5–5.2)
ALT: 40 U/L — ABNORMAL HIGH (ref 0–35)
ANION GAP: 12 (ref 5–15)
AST: 94 U/L — ABNORMAL HIGH (ref 0–37)
Alkaline Phosphatase: 148 U/L — ABNORMAL HIGH (ref 39–117)
BUN: 6 mg/dL (ref 6–23)
CO2: 24 mEq/L (ref 19–32)
Calcium: 8.6 mg/dL (ref 8.4–10.5)
Chloride: 104 mEq/L (ref 96–112)
Creatinine, Ser: 0.51 mg/dL (ref 0.50–1.10)
GFR calc Af Amer: >90 mL/min (ref >90)
GFR calc non Af Amer: >90 mL/min (ref >90)
Glucose, Bld: 82 mg/dL (ref 70–99)
Potassium: 3.9 mEq/L (ref 3.7–5.3)
Sodium: 140 mEq/L (ref 137–147)
TOTAL PROTEIN: 7.5 g/dL (ref 6.0–8.3)
Total Bilirubin: 7 mg/dL — ABNORMAL HIGH (ref 0.3–1.2)

## 2014-03-28 LAB — RETICULOCYTES: RBC.: 2.21 MIL/uL — AB (ref 3.87–5.11)

## 2014-03-28 LAB — CBC WITH DIFFERENTIAL/PLATELET
BASOS PCT: 1 % (ref 0–1)
Basophils Absolute: 0.1 10*3/uL (ref 0.0–0.1)
Eosinophils Absolute: 0.1 10*3/uL (ref 0.0–0.7)
Eosinophils Relative: 1 % (ref 0–5)
HEMATOCRIT: 22.5 % — AB (ref 36.0–46.0)
HEMOGLOBIN: 8.2 g/dL — AB (ref 12.0–15.0)
LYMPHS PCT: 33 % (ref 12–46)
Lymphs Abs: 2.9 10*3/uL (ref 0.7–4.0)
MCH: 37.1 pg — ABNORMAL HIGH (ref 26.0–34.0)
MCHC: 36.4 g/dL — ABNORMAL HIGH (ref 30.0–36.0)
MCV: 101.8 fL — ABNORMAL HIGH (ref 78.0–100.0)
Monocytes Absolute: 1.4 10*3/uL — ABNORMAL HIGH (ref 0.1–1.0)
Monocytes Relative: 16 % — ABNORMAL HIGH (ref 3–12)
NEUTROS PCT: 49 % (ref 43–77)
Neutro Abs: 4.2 10*3/uL (ref 1.7–7.7)
Platelets: 205 10*3/uL (ref 150–400)
RBC: 2.21 MIL/uL — ABNORMAL LOW (ref 3.87–5.11)
RDW: 17.9 % — ABNORMAL HIGH (ref 11.5–15.5)
WBC: 8.7 10*3/uL (ref 4.0–10.5)

## 2014-03-28 LAB — URINALYSIS, ROUTINE W REFLEX MICROSCOPIC
Glucose, UA: NEGATIVE mg/dL
Ketones, ur: NEGATIVE mg/dL
LEUKOCYTES UA: NEGATIVE
NITRITE: NEGATIVE
PH: 6.5 (ref 5.0–8.0)
Protein, ur: 100 mg/dL — AB
SPECIFIC GRAVITY, URINE: 1.015 (ref 1.005–1.030)
Urobilinogen, UA: 1 mg/dL (ref 0.0–1.0)

## 2014-03-28 LAB — URINE MICROSCOPIC-ADD ON

## 2014-03-28 LAB — PREGNANCY, URINE: Preg Test, Ur: NEGATIVE

## 2014-03-28 MED ORDER — HYDROMORPHONE HCL PF 1 MG/ML IJ SOLN
1.0000 mg | Freq: Once | INTRAMUSCULAR | Status: AC
  Administered 2014-03-28: 1 mg via INTRAVENOUS

## 2014-03-28 MED ORDER — SODIUM CHLORIDE 0.9 % IV SOLN
INTRAVENOUS | Status: DC
  Administered 2014-03-28: 16:00:00 via INTRAVENOUS

## 2014-03-28 MED ORDER — HYDROMORPHONE HCL PF 1 MG/ML IJ SOLN
1.0000 mg | Freq: Once | INTRAMUSCULAR | Status: AC
  Administered 2014-03-28: 1 mg via INTRAVENOUS
  Filled 2014-03-28: qty 1

## 2014-03-28 MED ORDER — HYDROMORPHONE HCL PF 2 MG/ML IJ SOLN
2.0000 mg | Freq: Once | INTRAMUSCULAR | Status: AC
Start: 2014-03-28 — End: 2014-03-28
  Administered 2014-03-28: 2 mg via INTRAVENOUS
  Filled 2014-03-28: qty 1

## 2014-03-28 MED ORDER — HYDROMORPHONE HCL PF 1 MG/ML IJ SOLN
INTRAMUSCULAR | Status: AC
  Administered 2014-03-28: 1 mg via INTRAVENOUS
  Filled 2014-03-28: qty 1

## 2014-03-28 MED ORDER — HYDROMORPHONE HCL PF 1 MG/ML IJ SOLN
1.0000 mg | INTRAMUSCULAR | Status: AC | PRN
  Administered 2014-03-28 (×2): 1 mg via INTRAVENOUS
  Filled 2014-03-28 (×2): qty 1

## 2014-03-28 MED ORDER — PROMETHAZINE HCL 12.5 MG PO TABS
25.0000 mg | ORAL_TABLET | Freq: Once | ORAL | Status: AC
  Administered 2014-03-28: 25 mg via ORAL
  Filled 2014-03-28: qty 2

## 2014-03-28 MED ORDER — OXYCODONE HCL 10 MG PO TABS: 10.0000 mg | ORAL_TABLET | ORAL | Status: DC | PRN

## 2014-03-28 NOTE — ED Notes (Signed)
Provided patient with saltines

## 2014-03-28 NOTE — ED Notes (Signed)
Patient states she is at 4/10 on her pain.  Patient states she normally functions at a 4 in her daily life.  Patient continues to refuse admit.  Patient states she will follow up with her sickle cell clinic tomorrow.

## 2014-03-28 NOTE — ED Notes (Signed)
EDP back in to re eval 

## 2014-03-28 NOTE — ED Provider Notes (Signed)
CSN: [id number]     Arrival date & time 03/28/14  1401 History   First MD Initiated Contact with Patient 03/28/14 1458     Chief Complaint  Patient presents with  . Sickle Cell Pain Crisis     HPI Pt was seen at 1500. Per pt, c/o gradual onset and persistence of constant "sickle cell pain" since last night. Pt describes her pain as located in her chest, back and bilateral legs. States she has been taking her usual medications as prescribed. Denies fevers, no palpitations, no SOB/cough, no abd pain, no N/V/D, no dysuria, no rash.    Past Medical History  Diagnosis Date  . Sickle cell disease   . Sickle cell disease, type S   . Blood transfusion   . 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 HCAP (healthcare-associated pneumonia) 05/19/2013   Past Surgical History  Procedure Laterality Date  . Cholecystectomy    .  left knee acl reconstruction    . Cesarean section      x 2  . Portacath placement    . Portacath placement Left 02/23/2013    Procedure: INSERTION PORT-A-CATH;  Surgeon: Donato Heinz, MD;  Location: [facility];  Service: General;  Laterality: Left;  . Port-a-cath removal Left 07/29/2013    Procedure: REMOVAL PORT-A-CATH;  Surgeon: Scherry Ran, MD;  Location: [facility];  Service: General;  Laterality: Left;  . Tee without cardioversion N/A 08/07/2013    Procedure: TRANSESOPHAGEAL ECHOCARDIOGRAM (TEE);  Surgeon: Arnoldo Lenis, MD;  Location: [facility];  Service: Cardiology;  Laterality: N/A;   Family History  Problem Relation Age of Onset  . Sickle cell trait Mother   . Sickle cell trait Father   . Hypertension Father   . Diabetes Father   . Sickle cell trait Sister   . Cancer Paternal Aunt     Breast   History  Substance Use Topics  . Smoking status: Never Smoker   . Smokeless tobacco: Not on file  . Alcohol Use: No    Review of Systems ROS: Statement: All systems negative except as marked or noted in the HPI; Constitutional: Negative for fever  and chills. ; ; Eyes: Negative for eye pain, redness and discharge. ; ; ENMT: Negative for ear pain, hoarseness, nasal congestion, sinus pressure and sore throat. ; ; Cardiovascular: Negative for palpitations, diaphoresis, dyspnea and peripheral edema. ; ; Respiratory: Negative for cough, wheezing and stridor. ; ; Gastrointestinal: Negative for nausea, vomiting, diarrhea, abdominal pain, blood in stool, hematemesis, jaundice and rectal bleeding. . ; ; Genitourinary: Negative for dysuria, flank pain and hematuria. ; ; Musculoskeletal: +CP, back pain, leg pain. Negative for neck pain. Negative for swelling and trauma.; ; Skin: Negative for pruritus, rash, abrasions, blisters, bruising and skin lesion.; ; Neuro: Negative for headache, lightheadedness and neck stiffness. Negative for weakness, altered level of consciousness , altered mental status, extremity weakness, paresthesias, involuntary movement, seizure and syncope.      Allergies  Desferal; Latex; Lisinopril; and Tape  Home Medications   Prior to Admission medications   Medication Sig Start Date End Date Taking? Authorizing Provider  cetirizine (ZYRTEC) 10 MG tablet Take 1 tablet (10 mg total) by mouth daily as needed. For allergies 07/21/12  Yes Luvenia Heller, FNP  folic acid (FOLVITE) 1 MG tablet Take 1 tablet (1 mg total) by mouth daily. 11/23/13  Yes Leana Gamer, MD  hydroxyurea (HYDREA) 500 MG capsule Take 1-2 capsules (500-1,000 mg total) by mouth as  directed. Take 500mg  (1 tablet) ON ODD DAYS AND 1000 MG (2) TABLETS ON THE EVEN DAYS. 11/25/13  Yes Leana Gamer, MD  Oxycodone HCl 10 MG TABS Take 10 mg by mouth every 4 (four) hours as needed (pain).   Yes Historical Provider, MD  promethazine (PHENERGAN) 12.5 MG tablet Take 12.5 mg by mouth every 6 (six) hours as needed for nausea. 1-2 prn begin with 1 if no relief may repeat x's 1 03/02/14  Yes [name], FNP  Vitamin D, Ergocalciferol, (DRISDOL) 50000 UNITS CAPS capsule Take  1 capsule (50,000 Units total) by mouth every 7 (seven) days. Takes on Sundays. 11/23/13  Yes Leana Gamer, MD  OxyCODONE (OXYCONTIN) 10 mg T12A 12 hr tablet Take 10 mg by mouth every 12 (twelve) hours as needed (pain).    Historical Provider, MD   BP 132/80  Pulse 87  Temp(Src) 98.6 F (37 C) (Oral)  Resp 15  Ht 5\' 3"  (1.6 m)  Wt 154 lb (69.854 kg)  BMI 27.29 kg/m2  SpO2 97% Physical Exam 1505: Physical examination:  Nursing notes reviewed; Vital signs and O2 SAT reviewed;  Constitutional: Well developed, Well nourished, Well hydrated, Uncomfortable appearing.; Head:  Normocephalic, atraumatic; Eyes: EOMI, PERRL, No scleral icterus; ENMT: Mouth and pharynx normal, Mucous membranes moist; Neck: Supple, Full range of motion, No lymphadenopathy; Cardiovascular: Regular rate and rhythm, No murmur, rub, or gallop; Respiratory: Breath sounds clear & equal bilaterally, No rales, rhonchi, wheezes.  Speaking full sentences with ease, Normal respiratory effort/excursion; Chest: Nontender, Movement normal; Abdomen: Soft, Nontender, Nondistended, Normal bowel sounds; Genitourinary: No CVA tenderness; Extremities: Pulses normal, No tenderness, No edema, No calf edema or asymmetry.; Neuro: AA&Ox3, Major CN grossly intact.  Speech clear. No gross focal motor or sensory deficits in extremities.; Skin: Color normal, Warm, Dry.    ED Course  Procedures   MDM  MDM Reviewed: previous chart, nursing note and vitals Reviewed previous: labs Interpretation: labs and x-ray    Results for orders placed during the hospital encounter of 03/28/14  CBC WITH DIFFERENTIAL      Result Value Ref Range   WBC 8.7  4.0 - 10.5 K/uL   RBC 2.21 (*) 3.87 - 5.11 MIL/uL   Hemoglobin 8.2 (*) 12.0 - 15.0 g/dL   HCT 22.5 (*) 36.0 - 46.0 %   MCV 101.8 (*) 78.0 - 100.0 fL   MCH 37.1 (*) 26.0 - 34.0 pg   MCHC 36.4 (*) 30.0 - 36.0 g/dL   RDW 17.9 (*) 11.5 - 15.5 %   Platelets 205  150 - 400 K/uL   Neutrophils Relative  % 49  43 - 77 %   Lymphocytes Relative 33  12 - 46 %   Monocytes Relative 16 (*) 3 - 12 %   Eosinophils Relative 1  0 - 5 %   Basophils Relative 1  0 - 1 %   Neutro Abs 4.2  1.7 - 7.7 K/uL   Lymphs Abs 2.9  0.7 - 4.0 K/uL   Monocytes Absolute 1.4 (*) 0.1 - 1.0 K/uL   Eosinophils Absolute 0.1  0.0 - 0.7 K/uL   Basophils Absolute 0.1  0.0 - 0.1 K/uL   RBC Morphology POLYCHROMASIA PRESENT     WBC Morphology ATYPICAL LYMPHOCYTES    URINALYSIS, ROUTINE W REFLEX MICROSCOPIC      Result Value Ref Range   Color, Urine AMBER (*) YELLOW   APPearance CLEAR  CLEAR   Specific Gravity, Urine 1.015  1.005 -  1.030   pH 6.5  5.0 - 8.0   Glucose, UA NEGATIVE  NEGATIVE mg/dL   Hgb urine dipstick MODERATE (*) NEGATIVE   Bilirubin Urine SMALL (*) NEGATIVE   Ketones, ur NEGATIVE  NEGATIVE mg/dL   Protein, ur 100 (*) NEGATIVE mg/dL   Urobilinogen, UA 1.0  0.0 - 1.0 mg/dL   Nitrite NEGATIVE  NEGATIVE   Leukocytes, UA NEGATIVE  NEGATIVE  PREGNANCY, URINE      Result Value Ref Range   Preg Test, Ur NEGATIVE  NEGATIVE  RETICULOCYTES      Result Value Ref Range   Retic Ct Pct >23.0 (*) 0.4 - 3.1 %   RBC. 2.21 (*) 3.87 - 5.11 MIL/uL   Retic Count, Manual NOT CALCULATED  19.0 - 186.0 K/uL  COMPREHENSIVE METABOLIC PANEL      Result Value Ref Range   Sodium 140  137 - 147 mEq/L   Potassium 3.9  3.7 - 5.3 mEq/L   Chloride 104  96 - 112 mEq/L   CO2 24  19 - 32 mEq/L   Glucose, Bld 82  70 - 99 mg/dL   BUN 6  6 - 23 mg/dL   Creatinine, Ser 0.51  0.50 - 1.10 mg/dL   Calcium 8.6  8.4 - 10.5 mg/dL   Total Protein 7.5  6.0 - 8.3 g/dL   Albumin 3.2 (*) 3.5 - 5.2 g/dL   AST 94 (*) 0 - 37 U/L   ALT 40 (*) 0 - 35 U/L   Alkaline Phosphatase 148 (*) 39 - 117 U/L   Total Bilirubin 7.0 (*) 0.3 - 1.2 mg/dL   GFR calc non Af Amer >90  >90 mL/min   GFR calc Af Amer >90  >90 mL/min   Anion gap 12  5 - 15  URINE MICROSCOPIC-ADD ON      Result Value Ref Range   Squamous Epithelial / LPF RARE  RARE   RBC / HPF 3-6   <3 RBC/hpf    Results for Galluzzo, Zelma L (MRN [id number]) as of 03/28/2014 17:12  Ref. Range 08/07/2013 04:52 08/10/2013 04:43 11/16/2013 09:18 03/01/2014 13:03 03/28/2014 15:40  Hemoglobin Latest Range: 12.0-15.0 g/dL 7.6 (L) 7.8 (L) 9.5 (L) 8.6 (L) 8.2 (L)  HCT Latest Range: 36.0-46.0 % 21.2 (L) 22.1 (L) 25.8 (L) 23.1 (L) 22.5 (L)     2100:  Pt continues to c/o pain despite multiple doses of IV dilaudid. Admission offered multiple times by myself and ED RN. Pt refuses. Pt with limping gait, rubbing her muscles, stating "I just have to go now." ED RN and I again offered admission; pt refuses. Pt makes her own medical decisions. Pt states she will call her Sickle Cell doctor tomorrow morning and f/u in the clinic tomorrow after work. Dx and testing d/w pt and family.  Questions answered.  Verb understanding, agreeable to d/c home with outpt f/u.    Alfonzo Feller, DO 03/30/14 1228

## 2014-03-28 NOTE — ED Notes (Signed)
Pt c/o chest, back and bilateral leg pain that started during the night, has hx of sickle cell,

## 2014-03-28 NOTE — Discharge Instructions (Signed)
°Emergency Department Resource Guide °1) Find a Doctor and Pay Out of Pocket °Although you won't have to find out who is covered by your insurance plan, it is a good idea to ask around and get recommendations. You will then need to call the office and see if the doctor you have chosen will accept you as a new patient and what types of options they offer for patients who are self-pay. Some doctors offer discounts or will set up payment plans for their patients who do not have insurance, but you will need to ask so you aren't surprised when you get to your appointment. ° °2) Contact Your Local Health Department °Not all health departments have doctors that can see patients for sick visits, but many do, so it is worth a call to see if yours does. If you don't know where your local health department is, you can check in your phone book. The CDC also has a tool to help you locate your state's health department, and many state websites also have listings of all of their local health departments. ° °3) Find a Walk-in Clinic °If your illness is not likely to be very severe or complicated, you may want to try a walk in clinic. These are popping up all over the country in pharmacies, drugstores, and shopping centers. They're usually staffed by nurse practitioners or physician assistants that have been trained to treat common illnesses and complaints. They're usually fairly quick and inexpensive. However, if you have serious medical issues or chronic medical problems, these are probably not your best option. ° °No Primary Care Doctor: °- Call [facility] at  832-8000 - they can help you locate a primary care doctor that  accepts your insurance, provides certain services, etc. °- Physician Referral Service- 1-800-533-3463 ° °Chronic Pain Problems: °Organization         Address  Phone   Notes  °[facility]  ([phone]) [phone] Patients need to be referred by their primary care doctor.  ° °Medication  Assistance: °Organization         Address  Phone   Notes  °[place] [facility] [address] °[place], [place] [address] (336) 641-8030 --Must be a resident of [place] °-- Must have NO insurance coverage whatsoever (no Medicaid/ Medicare, etc.) °-- The pt. MUST have a primary care doctor that directs their care regularly and follows them in the community °  °[facility]  (866) 331-1348   °[employer]  (888) 892-1162   ° °Agencies that provide inexpensive medical care: °Organization         Address  Phone   Notes  °[facility]  (336) 832-8035   °[facility]    (336) 832-7272   °[facility] [address] °[place], [place] [address] (336) 832-4777   °[facility] [address], °[place] (336) 271-4999   °[facility]    (336) 373-0678   °[facility]    (336) 272-1050   °[facility] ° [address], [place] Phone:  (336) 832-4444, Fax:  (336) 832-4440 Hours of Operation:  9 am - 6 pm, M-F.  Also accepts Medicaid/Medicare and self-pay.  °[facility] ° [address], [place] Phone: (336) [phone], Fax: (336) [phone]. Hours of Operation:  8:30 am - 5:30 pm, M-F.  Also accepts Medicaid and self-pay.  °[facility] [place] [address], [place] Phone: (336) 878-6027   °[facility] [address], [place], [place] (336)723-1848, Ext. 123 Mondays & Thursdays: 7-9 AM.  First 15 patients are seen on a first come, first serve basis. °  ° °Medicaid-accepting [place] Providers: ° °Organization         Address  Phone   Notes  °[facility] [address], [place] (336) 641-2100 Also accepts self-pay patients.  °[facility] [address], [place] ° (336) 856-9996   °[facility] [address], [place]  (336) 288-8857   °[facility] [address] [place] [address], [place] (336) 299-7000   °Veita Bland [address], [place]  ° (336) 373-1557 Only accepts Door Access Medicaid patients after they have their name applied to their card.  ° °Self-Pay (no insurance) in [place]: ° °Organization         Address  Phone   Notes  °Sickle Cell Patients, [facility] [address], [place] (336) 832-1970   °[facility] [address], [place] (336) 832-4400   °[facility] [place] ° [address], [place] (336) 992-4800   °[facility]/Dr. Osei-Bonsu ° [address] [place] [address], [place] or [address], [place] (336) [phone] Phone number for both [place] and [place] locations is the same.  °[facility] [address], [place] (336) 299-0000   °[facility] [address] [place] [address], [place] or [address] (336) 852-7530 °(336) 878-2260   °[facility] [address], [place] (336) 350-1642, phone; (336) 294-5005, fax Sees patients 1st and 3rd Saturday of every month.  Must not qualify for public or private insurance (i.e. Medicaid, Medicare, [place] Health Choice, Veterans' Benefits) • Household income should be no more than 200% of the poverty level •The clinic cannot treat you if you are pregnant or think you are pregnant • Sexually transmitted diseases are not treated at the clinic.  ° ° °Dental Care: °Organization         Address  Phone  Notes  °[place] [facility] [address], [place] (336) 641-6152 Accepts children up to age 21 who are enrolled in Medicaid or [place] Health Choice; pregnant women with a Medicaid card; and children who have applied for Medicaid or [place] Health Choice, but were declined, whose parents can pay a reduced fee at time of service.  °[place]  [facility] [place]  [address], [place] (336) 641-7733 Accepts children up to age 21 who are enrolled in Medicaid or [place] Health Choice; pregnant women with a Medicaid card; and children who have applied for Medicaid or  Health Choice, but were declined, whose parents can pay a reduced fee at time of service.  °[facility] ° [address], [place] (336) 641-4533 Patients are seen by appointment only. Walk-ins are not accepted. [facility] will see patients 18 years of age and older. °Monday - Tuesday (8am-5pm) °Most Wednesdays (8:30-5pm) °$30 per visit, cash only  °[facility] ° [address], [place] (336) [phone] Patients are seen by appointment only. Walk-ins are not accepted. [facility] will see patients 18 years of age and older. °One   Wednesday Evening (Monthly: Volunteer Based).  $30 per visit, cash only  °[facility]  (919) 537-3737 for adults; Children under age 4, call Graduate Pediatric Dentistry at (919) [phone]. Children aged 4-14, please call ([phone]) [phone] to request a pediatric application. ° Dental services are provided in all areas of dental care including fillings, crowns and bridges, complete and partial dentures, implants, gum treatment, root canals, and extractions. Preventive care is also provided. Treatment is provided to both adults and children. °Patients are selected via a lottery and there is often a waiting list. °  °[facility] [address], °[place] ° (336) 763-8833 [email or web address].[email or web address] °  °[facility] [address], [place], [place] (336)723-1848, Ext. 123 Second and Fourth Thursday of each month, opens at 6:30 AM; Clinic ends at 9 AM.  Patients are seen on a first-come first-served basis, and a limited number are seen during each clinic.  ° °[facility] ° [address], [place], [place] (336) 723-7904    Eligibility Requirements °You must have lived in [place], [place], or [place] counties for at least the last three months. °  You cannot be eligible for state or federal sponsored healthcare insurance, including Veterans Administration, Medicaid, or Medicare. °  You generally cannot be eligible for healthcare insurance through your employer.  °  How to apply: °Eligibility screenings are held every Tuesday and Wednesday afternoon from 1:00 pm until 4:00 pm. You do not need an appointment for the interview!  °[facility] [address], [place], [place] 336-631-2330   °[facility]  336-342-8273   °[facility]  336-703-3100   °[facility]  336-570-6415   ° °Behavioral Health Resources in the Community: °Intensive Outpatient Programs °Organization         Address  Phone  Notes  °[facility] [address], [place], [place] 336-878-6098   °[facility] [address], [place], [place] 336-832-9800   °[facility] [address], [place], [place] ° 336-882-2125   °[facility] [address],  °[place], [place] 1-800-853-5163 or 336-641-4981   °Substance Abuse Resources °Organization         Address  Phone  Notes  °[facility]  336-882-2125   °[facility]  336-784-9470   °[facility]  336-285-9073   °[facility]  336-845-3988   °Residential & Outpatient Substance Abuse Program  1-800-659-3381   °Psychological Services °Organization         Address  Phone  Notes  °[facility]  336- 832-9600   °[facility]  336- 378-7881   °[facility] [address], [place] 1-800-853-5163 or 336-641-4981   ° °Mobile Crisis Teams °Organization         Address  Phone  Notes  °[facility], Mobile Crisis Care Unit  1-877-626-1772   °Assertive °[facility] ° [address]  [place], [place] 336-834-9664   °Sharon DeEsch [address] °[place] 336-554-5454   ° °Self-Help/Support Groups °Organization         Address  Phone             Notes  °[facility] - variety of support groups  336- 373-1402 Call for more information  °Narcotics Anonymous (NA), [facility] [address], °[place]   2 meetings at this location  ° °  Residential Treatment Programs Organization         Address  Phone  Notes  [facility] [address],    [place]  1-367-468-1222   [facility]  [address], [place] [address], [place], [place]   [employer] [place], [place] (318) 344-0503 Admissions: 8am-3pm M-F  [facility] [place] [address],    [place], [place] 505-183-3582   [facility] [address] [place], [place], [place]   [facility] [address]  [place], [place]   [facility] Intensive Outpatient [address], [place], [place]   [facility] ([address]  [place], [place] 1-812-338-1790 or 4175074062   [facility] [address] [place], [place] Accepts Medicaid  [facility] [address]  [place] 1-437-486-5827 Substance Abuse/Addiction Treatment   [facility]         Address  Phone  Notes  [facility]  (587) 559-8100   Domenic Schwab, PhD [address], [place]   (216)462-7716 or 272-157-5755   [facility] [place], [place] 559-530-0798   [facility] [address] [place], [place] 972-829-2707 Insurance/Medicaid/sponsorship through [facility] [address]                                    [place], [place] (205)732-0195 [facility] [address], [place] [phone]    Dr. Adele Schilder  (416) [phone]   [facility] Dept. 1) [address] [place] 2) [place] 3)  [address], [place] [phone]  (217) [phone]   [place] (517)004-5332 or 224-036-9906 (After Hours)      Take the prescription as directed.  Call your regular Sickle Cell doctor tomorrow morning to schedule a follow up appointment tomorrow.  Return to the Emergency Department immediately sooner if worsening, you change your mind about getting admitted for pain control, you develop a fever, or for any other concerns.

## 2014-03-28 NOTE — ED Notes (Signed)
Patient continues to refuse admission.  Patient discharged home in good condition.

## 2014-03-29 LAB — URINE CULTURE
Colony Count: NO GROWTH
Culture: NO GROWTH

## 2014-03-31 ENCOUNTER — Encounter (HOSPITAL_COMMUNITY): Payer: Self-pay | Admitting: *Deleted

## 2014-03-31 ENCOUNTER — Telehealth (HOSPITAL_COMMUNITY): Payer: Self-pay | Admitting: *Deleted

## 2014-03-31 ENCOUNTER — Telehealth (HOSPITAL_COMMUNITY): Payer: Self-pay | Admitting: Hematology

## 2014-03-31 ENCOUNTER — Non-Acute Institutional Stay (HOSPITAL_COMMUNITY)
Admission: AD | Admit: 2014-03-31 | Discharge: 2014-03-31 | Disposition: A | Discharge status: Home / Self Care | Payer: BC Managed Care – PPO | Attending: Internal Medicine | Admitting: Internal Medicine

## 2014-03-31 DIAGNOSIS — M545 Low back pain, unspecified: Secondary | ICD-10-CM | POA: Diagnosis present

## 2014-03-31 DIAGNOSIS — D5700 Hb-SS disease with crisis, unspecified: Secondary | ICD-10-CM | POA: Insufficient documentation

## 2014-03-31 DIAGNOSIS — E860 Dehydration: Secondary | ICD-10-CM | POA: Diagnosis not present

## 2014-03-31 DIAGNOSIS — Z79899 Other long term (current) drug therapy: Secondary | ICD-10-CM | POA: Insufficient documentation

## 2014-03-31 DIAGNOSIS — M79609 Pain in unspecified limb: Secondary | ICD-10-CM | POA: Diagnosis present

## 2014-03-31 DIAGNOSIS — E559 Vitamin D deficiency, unspecified: Secondary | ICD-10-CM | POA: Diagnosis not present

## 2014-03-31 DIAGNOSIS — D571 Sickle-cell disease without crisis: Secondary | ICD-10-CM

## 2014-03-31 MED ORDER — HYDROMORPHONE HCL PF 2 MG/ML IJ SOLN
1.8000 mg | Freq: Once | INTRAMUSCULAR | Status: AC
  Administered 2014-03-31: 1.8 mg via INTRAVENOUS
  Filled 2014-03-31: qty 1

## 2014-03-31 MED ORDER — OXYCODONE HCL 5 MG PO TABS
10.0000 mg | ORAL_TABLET | Freq: Once | ORAL | Status: AC
  Administered 2014-03-31: 10 mg via ORAL
  Filled 2014-03-31: qty 2

## 2014-03-31 MED ORDER — NALOXONE HCL 0.4 MG/ML IJ SOLN: 0.4000 mg | INTRAMUSCULAR | Status: DC | PRN

## 2014-03-31 MED ORDER — HYDROMORPHONE HCL PF 2 MG/ML IJ SOLN
2.2500 mg | Freq: Once | INTRAMUSCULAR | Status: AC
  Administered 2014-03-31: 2.25 mg via INTRAVENOUS
  Filled 2014-03-31: qty 2

## 2014-03-31 MED ORDER — DEXTROSE-NACL 5-0.45 % IV SOLN
INTRAVENOUS | Status: DC
  Administered 2014-03-31: 11:00:00 via INTRAVENOUS

## 2014-03-31 MED ORDER — KETOROLAC TROMETHAMINE 30 MG/ML IJ SOLN
30.0000 mg | Freq: Four times a day (QID) | INTRAMUSCULAR | Status: DC
  Administered 2014-03-31 (×2): 30 mg via INTRAVENOUS
  Filled 2014-03-31: qty 1

## 2014-03-31 MED ORDER — HYDROMORPHONE HCL PF 2 MG/ML IJ SOLN
3.5000 mg | Freq: Once | INTRAMUSCULAR | Status: AC
  Administered 2014-03-31: 3.5 mg via INTRAVENOUS
  Filled 2014-03-31: qty 2

## 2014-03-31 MED ORDER — KETOROLAC TROMETHAMINE 30 MG/ML IJ SOLN
30.0000 mg | Freq: Once | INTRAMUSCULAR | Status: DC
  Filled 2014-03-31: qty 1

## 2014-03-31 MED ORDER — VITAMIN D (ERGOCALCIFEROL) 1.25 MG (50000 UNIT) PO CAPS: 50000.0000 [IU] | ORAL_CAPSULE | ORAL | Status: DC

## 2014-03-31 MED ORDER — FOLIC ACID 1 MG PO TABS: 1.0000 mg | ORAL_TABLET | Freq: Every day | ORAL | Status: DC

## 2014-03-31 MED ORDER — HYDROXYUREA 500 MG PO CAPS: 500.0000 mg | ORAL_CAPSULE | ORAL | Status: DC

## 2014-03-31 MED ORDER — HYDROMORPHONE 0.3 MG/ML IV SOLN
INTRAVENOUS | Status: DC
  Administered 2014-03-31: 14:00:00 via INTRAVENOUS
  Administered 2014-03-31: 7.12 mg via INTRAVENOUS
  Filled 2014-03-31 (×2): qty 25

## 2014-03-31 MED ORDER — ONDANSETRON HCL 4 MG/2ML IJ SOLN
4.0000 mg | Freq: Four times a day (QID) | INTRAMUSCULAR | Status: DC | PRN
Start: 2014-03-31 — End: 2014-03-31

## 2014-03-31 MED ORDER — PROMETHAZINE HCL 25 MG/ML IJ SOLN
12.5000 mg | Freq: Once | INTRAMUSCULAR | Status: AC
  Administered 2014-03-31: 12.5 mg via INTRAVENOUS
  Filled 2014-03-31: qty 1

## 2014-03-31 MED ORDER — DIPHENHYDRAMINE HCL 25 MG PO CAPS
25.0000 mg | ORAL_CAPSULE | ORAL | Status: DC | PRN
  Administered 2014-03-31: 50 mg via ORAL
  Filled 2014-03-31: qty 2

## 2014-03-31 MED ORDER — OXYCODONE HCL ER 10 MG PO T12A
10.0000 mg | EXTENDED_RELEASE_TABLET | Freq: Two times a day (BID) | ORAL | Status: DC | PRN
  Administered 2014-03-31: 10 mg via ORAL
  Filled 2014-03-31: qty 1

## 2014-03-31 MED ORDER — SODIUM CHLORIDE 0.9 % IJ SOLN: 9.0000 mL | INTRAMUSCULAR | Status: DC | PRN

## 2014-03-31 NOTE — H&P (Signed)
[place] History and Physical  Lusine Corlett Sealey IDP:[id number] DOB: 1981-06-18 DOA: 03/31/2014   PCP: Florine Sprenkle A., MD   Chief Complaint: Pain in back and legs x 3 days  HPI:Dawn [name] is a patient well known to me with Hb SS who has been having pain x 34 days. Pain is typical of her SCD type pain described as throbbing and aching. Pain is non-radiating and at an intensity of 9/10. She has been taking her usual medications but her pain is still uncontrolled. 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 She worked for the last 2 days and thinks that this may have exacerbated her symptoms.    Review of Systems:  Constitutional: No weight loss, night sweats, Fevers, chills, fatigue.  HEENT: No headaches, dizziness, seizures, vision changes, difficulty swallowing,Tooth/dental problems,Sore throat, No sneezing, itching, ear ache, nasal congestion, post nasal drip,  Cardio-vascular: No chest pain, Orthopnea, PND, swelling in lower extremities, anasarca, dizziness, palpitations  GI: No heartburn, indigestion, abdominal pain, nausea, vomiting, diarrhea, change in bowel habits, loss of appetite  Resp: No shortness of breath with exertion or at rest. No excess mucus, no productive cough, No non-productive cough, No coughing up of blood.No change in color of mucus.No wheezing.No chest wall deformity  Skin: no rash or lesions.  GU: no dysuria, change in color of urine, no urgency or frequency. No flank pain.  Psych: No change in mood or affect. No depression or anxiety. No memory loss.    Past Medical History  Diagnosis Date  . Sickle cell disease   . Sickle cell disease, type S   . Blood transfusion   . 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 HCAP (healthcare-associated pneumonia) 05/19/2013   Past Surgical History  Procedure Laterality Date  . Cholecystectomy    .  left knee acl reconstruction    . Cesarean section      x 2  .  Portacath placement    . Portacath placement Left 02/23/2013    Procedure: INSERTION PORT-A-CATH;  Surgeon: Donato Heinz, MD;  Location: [facility];  Service: General;  Laterality: Left;  . Port-a-cath removal Left 07/29/2013    Procedure: REMOVAL PORT-A-CATH;  Surgeon: Scherry Ran, MD;  Location: [facility];  Service: General;  Laterality: Left;  . Tee without cardioversion N/A 08/07/2013    Procedure: TRANSESOPHAGEAL ECHOCARDIOGRAM (TEE);  Surgeon: Arnoldo Lenis, MD;  Location: [facility];  Service: Cardiology;  Laterality: N/A;   Social History:  reports that she has never smoked. She does not have any smokeless tobacco history on file. She reports that she does not drink alcohol or use illicit drugs.  Allergies  Allergen Reactions  . Desferal [Deferoxamine] Hives    Local reaction on arm only during infusion. Can take with benadryl   . Latex Other (See Comments)    REACTION: Pt experiences a burning sensation on contacted skin areas  . Lisinopril Other (See Comments) and Cough    REACTION: Sore/scratchy throat  . Tape Other (See Comments)    REACTION: Pt. Experiences a burning sensation on contacted skin areas    Family History  Problem Relation Age of Onset  . Sickle cell trait Mother   . Sickle cell trait Father   . Hypertension Father   . Diabetes Father   . Sickle cell trait Sister   . Cancer Paternal Aunt     Breast    Prior to  Admission medications   Medication Sig Start Date End Date Taking? Authorizing Provider  cetirizine (ZYRTEC) 10 MG tablet Take 1 tablet (10 mg total) by mouth daily as needed. For allergies 07/21/12  Yes Luvenia Heller, FNP  folic acid (FOLVITE) 1 MG tablet Take 1 tablet (1 mg total) by mouth daily. 11/23/13  Yes Leana Gamer, MD  hydroxyurea (HYDREA) 500 MG capsule Take 1-2 capsules (500-1,000 mg total) by mouth as directed. Take 500mg  (1 tablet) ON ODD DAYS AND 1000 MG (2) TABLETS ON THE EVEN DAYS. 11/25/13  Yes Leana Gamer, MD   Oxycodone HCl (ROXICODONE) 10 MG TABS Take 1 tablet (10 mg total) by mouth every 4 (four) hours as needed for severe pain. 03/28/14  Yes Alfonzo Feller, DO  Oxycodone HCl 10 MG TABS Take 10 mg by mouth every 4 (four) hours as needed (pain).   Yes Historical Provider, MD  promethazine (PHENERGAN) 12.5 MG tablet Take 12.5 mg by mouth every 6 (six) hours as needed for nausea. 1-2 prn begin with 1 if no relief may repeat x's 1 03/02/14  Yes [name], FNP  Vitamin D, Ergocalciferol, (DRISDOL) 50000 UNITS CAPS capsule Take 1 capsule (50,000 Units total) by mouth every 7 (seven) days. Takes on Sundays. 11/23/13  Yes Leana Gamer, MD  OxyCODONE (OXYCONTIN) 10 mg T12A 12 hr tablet Take 10 mg by mouth every 12 (twelve) hours as needed (pain).    Historical Provider, MD   Physical Exam: Filed Vitals:   03/31/14 0955  BP: 131/85  Pulse: 93  Temp: 98.8 F (37.1 C)  TempSrc: Oral  Resp: 18  SpO2: 94%   General: Alert, awake, oriented x3, in no acute distress.  HEENT: [place]/AT PEERL, EOMI. Mild icterus Neck: Trachea midline,  no masses, no thyromegal,y no JVD, no carotid bruit OROPHARYNX:  Moist, No exudate/ erythema/lesions.  Heart: Regular rate and rhythm, without murmurs, rubs, gallops, PMI non-displaced, no heaves or thrills on palpation.  Lungs: Clear to auscultation, no wheezing or rhonchi noted. No increased vocal fremitus resonant to percussion  Abdomen: Soft, nontender, nondistended, positive bowel sounds, no masses no hepatosplenomegaly noted..  Neuro: No focal neurological deficits noted cranial nerves II through XII grossly intact. Neuro: A+OX3, 5/5 distal strength, nl sensory  Labs on Admission:   Basic Metabolic Panel:  Recent Labs Lab 03/28/14 1540  NA 140  K 3.9  CL 104  CO2 24  GLUCOSE 82  BUN 6  CREATININE 0.51  CALCIUM 8.6   Liver Function Tests:  Recent Labs Lab 03/28/14 1540  AST 94*  ALT 40*  ALKPHOS 148*  BILITOT 7.0*  PROT 7.5  ALBUMIN 3.2*    CBC:  Recent Labs Lab 03/28/14 1540  WBC 8.7  NEUTROABS 4.2  HGB 8.2*  HCT 22.5*  MCV 101.8*  PLT 205    Assessment/Plan: Active Problems: 1.Hb SS with crisis: Pt will be treated with initially treated with weight based rapid re-dosing IVF and Toradol . If pain is above 7/10, then Pt will be transitioned to a weight based Dilaudid PCA . Patient will be re-evaluated for pain in the context of function and relationship to baseline as care progresses.  2. Mild Dehydration: Dehydration:  Pt presented with mild hydration. She will be managed with IVF D5.45. Pt had/had no  nausea/ emesis.     Time spend: 40 minutes Code Status: Full Code Family Communication: N/A Disposition Plan: Likely home today.  Shaquila Sigman A., MD  Pager [phone]  If  7PM-7AM, please contact night-coverage [email or web address] Password [email or web address] 03/31/2014, 10:30 AM

## 2014-03-31 NOTE — Progress Notes (Signed)
Patient reports pain 4/10 and ready for discharge. Patient discharged home. Patient in no apparent distress. Discharge instructions reviewed. Patient acknowledges. Patient left day hospital with belongings ambulatory via self.

## 2014-03-31 NOTE — Telephone Encounter (Signed)
Received patient call. Patient c/o chest and back pain, feels like normal sickle cell pain, rating pain 7-8/10. Patient states she went to ED Sunday night. Patient states she continues to have pain. Patient reports taking oxycodone last night, taking every 4 hours. Patient reports she has not been taking oxycontin because she was told not to take the oxycontin and oxycodone together because it can slow her respirations down and she could stop breathing. Patient denies all other pertinent ROS per [facility] Triage Assessment. Informed patient that this RN will notify provider on call and patient is to receive a return call back with recommendation. Patient acknowledges. To clarify with MD the medications for patient to take when in crisis. Patient acknowledges.

## 2014-03-31 NOTE — Discharge Summary (Signed)
Physician Discharge Summary  Dawn [name] ERX:[id number] DOB: Jun 06, 1981 DOA: 03/31/2014  PCP: [name],[name], MD  Admit date: 03/31/2014 Discharge date: 03/31/2014  Discharge Diagnoses:  Active Problems:   * No active hospital problems. *   Discharge Condition: Stable  Disposition: Home  Diet: Regular Wt Readings from Last 3 Encounters:  03/28/14 154 lb (69.854 kg)  03/02/14 156 lb (70.761 kg)  03/01/14 155 lb (70.308 kg)    Hospital Course:  Patient admitted to day infusion center for extended observation. Patient started on hypotonic IV fluids for cellular rehydration. She is currently tolerating food and drink. Administered IV Toradol 30 mg times 1 dose for inflammation. Started IV dilaudid per weight based rapid re-dosing. Pain intensity remained 7/10, pt was transitioned to weight-based PCA dilaudid. She used a total of 7.12 mg with 12 demands and 12 deliveries. Pain intensity decreased to 4/10. Patient is functional, alert,oriented and ambulatory. She states that she can function at home on current medication regimen. Refills sent for maintenance medications. Patient to schedule a follow-up appointment in the office in 1 week. Recommend that patient continue to hydrate.   Discharge Exam:  Filed Vitals:   03/31/14 1600  BP: 144/88  Pulse: 83  Temp: 97.9 F (36.6 C)  Resp: 15   Filed Vitals:   03/31/14 1321 03/31/14 1343 03/31/14 1445 03/31/14 1600  BP:   127/72 144/88  Pulse:   82 83  Temp:   97.7 F (36.5 C) 97.9 F (36.6 C)  TempSrc:   Oral Oral  Resp:  15 14 15   SpO2: 89% 98% 96% 96%   BP 144/88  Pulse 83  Temp(Src) 97.9 F (36.6 C) (Oral)  Resp 15  SpO2 96% General appearance: alert, cooperative and no distress Eyes: positive findings: sclera icterus Back: symmetric, no curvature. ROM normal. No CVA tenderness. Lungs: clear to auscultation bilaterally Abdomen: soft, non-tender; bowel sounds normal; no masses,  no organomegaly Extremities: extremities  normal, atraumatic, no cyanosis or edema Musculoskeletal: No warm swelling or erythema around joints, no spinal tenderness noted. Psychiatric: Patient alert and oriented x3, good insight and cognition, good recent to remote recall.    Discharge Instructions     Medication List         cetirizine 10 MG tablet  Commonly known as:  ZYRTEC  Take 1 tablet (10 mg total) by mouth daily as needed. For allergies     folic acid 1 MG tablet  Commonly known as:  FOLVITE  Take 1 tablet (1 mg total) by mouth daily.     hydroxyurea 500 MG capsule  Commonly known as:  HYDREA  Take 1-2 capsules (500-1,000 mg total) by mouth as directed. Take 500mg  (1 tablet) ON ODD DAYS AND 1000 MG (2) TABLETS ON THE EVEN DAYS.     OxyCODONE 10 mg T12a 12 hr tablet  Commonly known as:  OXYCONTIN  Take 10 mg by mouth every 12 (twelve) hours as needed (pain).     Oxycodone HCl 10 MG Tabs  Take 10 mg by mouth every 4 (four) hours as needed (pain).     Oxycodone HCl 10 MG Tabs  Commonly known as:  ROXICODONE  Take 1 tablet (10 mg total) by mouth every 4 (four) hours as needed for severe pain.     promethazine 12.5 MG tablet  Commonly known as:  PHENERGAN  Take 12.5 mg by mouth every 6 (six) hours as needed for nausea. 1-2 prn begin with 1 if no relief may repeat x's  1     Vitamin D (Ergocalciferol) 50000 UNITS Caps capsule  Commonly known as:  DRISDOL  Take 1 capsule (50,000 Units total) by mouth every 7 (seven) days. Takes on Sundays.          The results of significant diagnostics from this hospitalization (including imaging, microbiology, ancillary and laboratory) are listed below for reference.    Significant Diagnostic Studies: Dg Chest 2 View  03/28/2014   CLINICAL DATA:  Sickle cell crisis.  EXAM: CHEST  2 VIEW  COMPARISON:  03/01/2014  FINDINGS: The heart size and mediastinal contours are within normal limits. Both lungs are clear. No evidence of pleural effusion. No mass or lymphadenopathy  identified. 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  IMPRESSION: No active cardiopulmonary disease. Chronic osseous changes of sickle cell disease.   Electronically Signed   By: Earle Gell M.D.   On: 03/28/2014 18:02    Microbiology: Recent Results (from the past 240 hour(s))  URINE CULTURE     Status: None   Collection Time    03/28/14  4:35 PM      Result Value Ref Range Status   Specimen Description URINE, CLEAN CATCH   Final   Special Requests NONE   Final   Culture  Setup Time     Final   Value: 03/28/2014 21:09     Performed at [place]     Final   Value: NO GROWTH     Performed at [facility]   Culture     Final   Value: NO GROWTH     Performed at [facility]   Report Status 03/29/2014 FINAL   Final     Labs: Basic Metabolic Panel:  Recent Labs Lab 03/28/14 1540  NA 140  K 3.9  CL 104  CO2 24  GLUCOSE 82  BUN 6  CREATININE 0.51  CALCIUM 8.6   Liver Function Tests:  Recent Labs Lab 03/28/14 1540  AST 94*  ALT 40*  ALKPHOS 148*  BILITOT 7.0*  PROT 7.5  ALBUMIN 3.2*   No results found for this basename: LIPASE, AMYLASE,  in the last 168 hours No results found for this basename: AMMONIA,  in the last 168 hours CBC:  Recent Labs Lab 03/28/14 1540  WBC 8.7  NEUTROABS 4.2  HGB 8.2*  HCT 22.5*  MCV 101.8*  PLT 205   Cardiac Enzymes: No results found for this basename: CKTOTAL, CKMB, CKMBINDEX, TROPONINI,  in the last 168 hours BNP: No components found with this basename: POCBNP,  CBG: No results found for this basename: GLUCAP,  in the last 168 hours Ferritin: No results found for this basename: FERRITIN,  in the last 168 hours  Time coordinating discharge: Greater than 30 minutes  Signed:  Hollis,Lachina M  03/31/2014, 5:16 PM

## 2014-03-31 NOTE — Discharge Instructions (Signed)
Sickle Cell Anemia, Adult °Sickle cell anemia is a condition in which red blood cells have an abnormal "sickle" shape. This abnormal shape shortens the cells' life span, which results in a lower than normal concentration of red blood cells in the blood. The sickle shape also causes the cells to clump together and block free blood flow through the blood vessels. As a result, the tissues and organs of the body do not receive enough oxygen. Sickle cell anemia causes organ damage and pain and increases the risk of infection. °CAUSES  °Sickle cell anemia is a genetic disorder. Those who receive two copies of the gene have the condition, and those who receive one copy have the trait. °RISK FACTORS °The sickle cell gene is most common in people whose families originated in [place]. Other areas of the globe where sickle cell trait occurs include the Mediterranean, [place] and [place], the [place], and the [place].  °SIGNS AND SYMPTOMS °· Pain, especially in the extremities, back, chest, or abdomen (common). The pain may start suddenly or may develop following an illness, especially if there is dehydration. Pain can also occur due to overexertion or exposure to extreme temperature changes. °· Frequent severe bacterial infections, especially certain types of pneumonia and meningitis. °· Pain and swelling in the hands and feet. °· Decreased activity.   °· Loss of appetite.   °· Change in behavior. °· Headaches. °· Seizures. °· Shortness of breath or difficulty breathing. °· Vision changes. °· Skin ulcers. °Those with the trait may not have symptoms or they may have mild symptoms.  °DIAGNOSIS  °Sickle cell anemia is diagnosed with blood tests that demonstrate the genetic trait. It is often diagnosed during the newborn period, due to mandatory testing nationwide. A variety of blood tests, X-rays, CT scans, MRI scans, ultrasounds, and lung function tests may also be done to monitor the condition. °TREATMENT  °Sickle  cell anemia may be treated with: °· Medicines. You may be given pain medicines, antibiotic medicines (to treat and prevent infections) or medicines to increase the production of certain types of hemoglobin. °· Fluids. °· Oxygen. °· Blood transfusions. °HOME CARE INSTRUCTIONS  °· Drink enough fluid to keep your urine clear or pale yellow. Increase your fluid intake in hot weather and during exercise. °· Do not smoke. Smoking lowers oxygen levels in the blood.   °· Only take over-the-counter or prescription medicines for pain, fever, or discomfort as directed by your health care provider. °· Take antibiotics as directed by your health care provider. Make sure you finish them it even if you start to feel better.   °· Take supplements as directed by your health care provider.   °· Consider wearing a medical alert bracelet. This tells anyone caring for you in an emergency of your condition.   °· When traveling, keep your medical information, health care provider's names, and the medicines you take with you at all times.   °· If you develop a fever, do not take medicines to reduce the fever right away. This could cover up a problem that is developing. Notify your health care provider. °· Keep all follow-up appointments with your health care provider. Sickle cell anemia requires regular medical care. °SEEK MEDICAL CARE IF: ° You have a fever. °SEEK IMMEDIATE MEDICAL CARE IF:  °· You feel dizzy or faint.   °· You have new abdominal pain, especially on the left side near the stomach area.   °· You develop a persistent, often uncomfortable and painful penile erection (priapism). If this is not treated immediately it   will lead to impotence.   You have numbness your arms or legs or you have a hard time moving them.   You have a hard time with speech.   You have a fever or persistent symptoms for more than 2-3 days.   You have a fever and your symptoms suddenly get worse.   You have signs or symptoms of infection.  These include:   Chills.   Abnormal tiredness (lethargy).   Irritability.   Poor eating.   Vomiting.   You develop pain that is not helped with medicine.   You develop shortness of breath.  You have pain in your chest.   You are coughing up pus-like or bloody sputum.   You develop a stiff neck.  Your feet or hands swell or have pain.  Your abdomen appears bloated.  You develop joint pain. MAKE SURE YOU:  Understand these instructions.  Will watch your child's condition.  Will get help right away if your child is not doing well or gets worse. Document Released: 12/19/2005 Document Revised: 07/01/2013 Document Reviewed: 04/22/2013 [facility] Patient Information 2015 [place], [place]. This information is not intended to replace advice given to you by your health care provider. Make sure you discuss any questions you have with your health care provider.

## 2014-03-31 NOTE — Telephone Encounter (Signed)
Spoke with MD about previous call from patient.  Per Dr. Zigmund Daniel it is ok for patient to come to [facility].  Ashok Cordia, RN called patient back to advise.

## 2014-03-31 NOTE — Telephone Encounter (Signed)
Received patient call. Instructed patient to come to day hospital. Patient acknowledges.

## 2014-04-08 NOTE — Discharge Summary (Signed)
Pt seen and examined and discussed with NP Cammie Sickle. Agree with discharge home. Dawn [name] A.

## 2014-05-05 NOTE — Telephone Encounter (Signed)
Pt called stated having incresed pain, Methadone and diluadid taken as prescribed, rates pain at 9 of 10. Dr Marlou Sa paged, order given to instruct pt to come to center for evaluation

## 2014-05-20 ENCOUNTER — Telehealth (HOSPITAL_COMMUNITY): Payer: Self-pay | Admitting: *Deleted

## 2014-05-20 NOTE — Telephone Encounter (Signed)
Need to Inform patient of [facility] in Sickle Cell Anemia: C[facility] for Mind, Body, & Spirit on Friday, Sept. 18, 2015. Message Left.

## 2014-05-20 NOTE — Telephone Encounter (Signed)
Informed patient of [facility] in Sickle Cell Anemia: [facility] for Mind, Body, & Spirit on Friday, Sept. 18, 2015. Patient acknowledges.  

## 2014-05-24 ENCOUNTER — Encounter (HOSPITAL_COMMUNITY): Payer: Self-pay

## 2014-05-24 ENCOUNTER — Telehealth (HOSPITAL_COMMUNITY): Payer: Self-pay | Admitting: *Deleted

## 2014-05-24 ENCOUNTER — Non-Acute Institutional Stay (HOSPITAL_COMMUNITY)
Admission: AD | Admit: 2014-05-24 | Discharge: 2014-05-24 | Disposition: A | Discharge status: Home / Self Care | Payer: BC Managed Care – PPO | Attending: Internal Medicine | Admitting: Internal Medicine

## 2014-05-24 DIAGNOSIS — D5700 Hb-SS disease with crisis, unspecified: Secondary | ICD-10-CM | POA: Diagnosis present

## 2014-05-24 DIAGNOSIS — D571 Sickle-cell disease without crisis: Secondary | ICD-10-CM

## 2014-05-24 DIAGNOSIS — D57819 Other sickle-cell disorders with crisis, unspecified: Secondary | ICD-10-CM | POA: Diagnosis not present

## 2014-05-24 LAB — COMPREHENSIVE METABOLIC PANEL
ALT: 38 U/L — ABNORMAL HIGH (ref 0–35)
ANION GAP: 14 (ref 5–15)
AST: 93 U/L — ABNORMAL HIGH (ref 0–37)
Albumin: 3.5 g/dL (ref 3.5–5.2)
Alkaline Phosphatase: 140 U/L — ABNORMAL HIGH (ref 39–117)
BUN: 5 mg/dL — ABNORMAL LOW (ref 6–23)
CALCIUM: 9.2 mg/dL (ref 8.4–10.5)
CO2: 24 mEq/L (ref 19–32)
CREATININE: 0.51 mg/dL (ref 0.50–1.10)
Chloride: 102 mEq/L (ref 96–112)
GFR calc non Af Amer: >90 mL/min (ref >90)
GLUCOSE: 116 mg/dL — AB (ref 70–99)
Potassium: 3.7 mEq/L (ref 3.7–5.3)
SODIUM: 140 meq/L (ref 137–147)
TOTAL PROTEIN: 8.3 g/dL (ref 6.0–8.3)
Total Bilirubin: 8 mg/dL — ABNORMAL HIGH (ref 0.3–1.2)

## 2014-05-24 LAB — CBC WITH DIFFERENTIAL/PLATELET
BASOS PCT: 1 % (ref 0–1)
Basophils Absolute: 0.1 10*3/uL (ref 0.0–0.1)
EOS PCT: 1 % (ref 0–5)
Eosinophils Absolute: 0.1 10*3/uL (ref 0.0–0.7)
HCT: 25.8 % — ABNORMAL LOW (ref 36.0–46.0)
HEMOGLOBIN: 9.4 g/dL — AB (ref 12.0–15.0)
LYMPHS PCT: 30 % (ref 12–46)
Lymphs Abs: 1.8 10*3/uL (ref 0.7–4.0)
MCH: 40.2 pg — AB (ref 26.0–34.0)
MCHC: 36.4 g/dL — ABNORMAL HIGH (ref 30.0–36.0)
MCV: 110.3 fL — ABNORMAL HIGH (ref 78.0–100.0)
MONOS PCT: 16 % — AB (ref 3–12)
Monocytes Absolute: 1 10*3/uL (ref 0.1–1.0)
NEUTROS PCT: 52 % (ref 43–77)
Neutro Abs: 3.1 10*3/uL (ref 1.7–7.7)
Platelets: 255 10*3/uL (ref 150–400)
RBC: 2.34 MIL/uL — AB (ref 3.87–5.11)
RDW: 17.1 % — ABNORMAL HIGH (ref 11.5–15.5)
WBC: 6.1 10*3/uL (ref 4.0–10.5)
nRBC: 6 /100 WBC — ABNORMAL HIGH

## 2014-05-24 LAB — RETICULOCYTES
RBC.: 2.34 MIL/uL — AB (ref 3.87–5.11)
RETIC COUNT ABSOLUTE: 456.3 10*3/uL — AB (ref 19.0–186.0)
RETIC CT PCT: 19.5 % — AB (ref 0.4–3.1)

## 2014-05-24 MED ORDER — OXYCODONE HCL 10 MG PO TABS: 10.0000 mg | ORAL_TABLET | ORAL | Status: DC | PRN

## 2014-05-24 MED ORDER — SODIUM CHLORIDE 0.9 % IJ SOLN: 9.0000 mL | INTRAMUSCULAR | Status: DC | PRN

## 2014-05-24 MED ORDER — HYDROMORPHONE 0.3 MG/ML IV SOLN
INTRAVENOUS | Status: DC
  Administered 2014-05-24: 6.99 mg via INTRAVENOUS
  Administered 2014-05-24: 4.99 mg via INTRAVENOUS
  Administered 2014-05-24 (×2): via INTRAVENOUS
  Filled 2014-05-24 (×2): qty 25

## 2014-05-24 MED ORDER — DEXTROSE-NACL 5-0.45 % IV SOLN
INTRAVENOUS | Status: DC
  Administered 2014-05-24: 10:00:00 via INTRAVENOUS

## 2014-05-24 MED ORDER — NALOXONE HCL 0.4 MG/ML IJ SOLN: 0.4000 mg | INTRAMUSCULAR | Status: DC | PRN

## 2014-05-24 MED ORDER — KETOROLAC TROMETHAMINE 15 MG/ML IJ SOLN
15.0000 mg | Freq: Four times a day (QID) | INTRAMUSCULAR | Status: DC
Start: 2014-05-24 — End: 2014-05-24
  Administered 2014-05-24: 15 mg via INTRAVENOUS
  Filled 2014-05-24 (×4): qty 1

## 2014-05-24 MED ORDER — PROMETHAZINE HCL 12.5 MG PO TABS: 12.5000 mg | ORAL_TABLET | Freq: Four times a day (QID) | ORAL | Status: DC | PRN

## 2014-05-24 MED ORDER — FOLIC ACID 1 MG PO TABS
1.0000 mg | ORAL_TABLET | Freq: Every day | ORAL | Status: DC
  Administered 2014-05-24: 1 mg via ORAL
  Filled 2014-05-24: qty 1

## 2014-05-24 MED ORDER — ONDANSETRON HCL 4 MG/2ML IJ SOLN: 4.0000 mg | INTRAMUSCULAR | Status: DC | PRN

## 2014-05-24 MED ORDER — SODIUM CHLORIDE 0.9 % IV SOLN
25.0000 mg | Freq: Four times a day (QID) | INTRAVENOUS | Status: DC | PRN
  Administered 2014-05-24: 25 mg via INTRAVENOUS
  Filled 2014-05-24: qty 0.5

## 2014-05-24 MED ORDER — ONDANSETRON HCL 4 MG PO TABS: 4.0000 mg | ORAL_TABLET | ORAL | Status: DC | PRN

## 2014-05-24 MED ORDER — OXYCODONE HCL 5 MG PO TABS
10.0000 mg | ORAL_TABLET | Freq: Once | ORAL | Status: AC
  Administered 2014-05-24: 10 mg via ORAL
  Filled 2014-05-24: qty 2

## 2014-05-24 NOTE — Progress Notes (Signed)
Patient ID: Dawn [name], female   DOB: Aug 06, 1981, 33 y.o.   MRN: [id number] Pt discharged to home; discharge instructions explained, given, and signed with all questions answered; pt alert and oriented and ambulatory; IV removed with no complications noted

## 2014-05-24 NOTE — H&P (Signed)
Dawn [name] is an 33 y.o. female.  With known sickle cell crisis presented to the sickle cell hospital with pain in her back and legs. Chief Complaint: Pain in back and legs HPI: Patient is a 33 year old female with known sickle cell disease presented to sickle cell the hospital with painful crisis in her back and leg. She took her home medication is not helping. Patient is under a lot of stress regarding her family especially her diet that is in the hospital with possible cancer. This has stressed her out and triggered her sickle cell crisis. She has no fever or cough. No shortness of breath no nausea vomiting or diarrhea. Pain is currently 10 out of 10 in her back and legs. It is worsened with movement and not  relieved with rest. It was not relieved by her home medication. It feels like her typical sickle cell pain   Past Medical History  Diagnosis Date  . Sickle cell disease   . Sickle cell disease, type S   . Blood transfusion   . 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 HCAP (healthcare-associated pneumonia) 05/19/2013    Past Surgical History  Procedure Laterality Date  . Cholecystectomy    .  left knee acl reconstruction    . Cesarean section      x 2  . Portacath placement    . Portacath placement Left 02/23/2013    Procedure: INSERTION PORT-A-CATH;  Surgeon: Donato Heinz, MD;  Location: [facility];  Service: General;  Laterality: Left;  . Port-a-cath removal Left 07/29/2013    Procedure: REMOVAL PORT-A-CATH;  Surgeon: Scherry Ran, MD;  Location: [facility];  Service: General;  Laterality: Left;  . Tee without cardioversion N/A 08/07/2013    Procedure: TRANSESOPHAGEAL ECHOCARDIOGRAM (TEE);  Surgeon: Arnoldo Lenis, MD;  Location: [facility];  Service: Cardiology;  Laterality: N/A;    Family History  Problem Relation Age of Onset  . Sickle cell trait Mother   . Sickle cell trait Father   . Hypertension Father   . Diabetes Father   . Sickle cell trait Sister   .  Cancer Paternal Aunt     Breast   Social History:  reports that she has never smoked. She does not have any smokeless tobacco history on file. She reports that she does not drink alcohol or use illicit drugs.  Allergies:  Allergies  Allergen Reactions  . Desferal [Deferoxamine] Hives    Local reaction on arm only during infusion. Can take with benadryl   . Latex Other (Dawn Comments)    REACTION: Pt experiences a burning sensation on contacted skin areas  . Lisinopril Other (Dawn Comments) and Cough    REACTION: Sore/scratchy throat  . Tape Other (Dawn Comments)    REACTION: Pt. Experiences a burning sensation on contacted skin areas    Medications Prior to Admission  Medication Sig Dispense Refill  . cetirizine (ZYRTEC) 10 MG tablet Take 1 tablet (10 mg total) by mouth daily as needed. For allergies  30 tablet  2  . folic acid (FOLVITE) 1 MG tablet Take 1 tablet (1 mg total) by mouth daily.  30 tablet  11  . hydroxyurea (HYDREA) 500 MG capsule Take 1-2 capsules (500-1,000 mg total) by mouth as directed. Take 500mg  (1 tablet) ON ODD DAYS AND 1000 MG (2) TABLETS ON THE EVEN DAYS.  46 capsule  2  . OxyCODONE (OXYCONTIN) 10 mg T12A 12 hr tablet Take 10 mg by mouth every 12 (twelve)  hours as needed (pain).      . Oxycodone HCl (ROXICODONE) 10 MG TABS Take 1 tablet (10 mg total) by mouth every 4 (four) hours as needed for severe pain.  12 tablet  0  . Oxycodone HCl 10 MG TABS Take 10 mg by mouth every 4 (four) hours as needed (pain).      . promethazine (PHENERGAN) 12.5 MG tablet Take 12.5 mg by mouth every 6 (six) hours as needed for nausea. 1-2 prn begin with 1 if no relief may repeat x's 1      . Vitamin D, Ergocalciferol, (DRISDOL) 50000 UNITS CAPS capsule Take 1 capsule (50,000 Units total) by mouth every 7 (seven) days. Takes on Sundays.  30 capsule  11    No results found for this or any previous visit (from the past 48 hour(s)). No results found.  Review of Systems  Constitutional:  Negative.   Eyes: Negative.   Respiratory: Negative.  Negative for cough and hemoptysis.   Cardiovascular: Negative.   Gastrointestinal: Negative.   Genitourinary: Negative.   Musculoskeletal: Positive for back pain, joint pain, myalgias and neck pain.  Skin: Negative.   Neurological: Positive for headaches.  Endo/Heme/Allergies: Negative.   Psychiatric/Behavioral: Negative.     There were no vitals taken for this visit. Physical Exam  Constitutional: She is oriented to person, place, and time. She appears well-developed and well-nourished.  HENT:  Head: Normocephalic and atraumatic.  Right Ear: External ear normal.  Left Ear: External ear normal.  Eyes: Conjunctivae and EOM are normal. Pupils are equal, round, and reactive to light.  Neck: Normal range of motion. Neck supple.  Cardiovascular: Normal rate, regular rhythm and normal heart sounds.   Respiratory: Effort normal and breath sounds normal.  GI: Soft. Bowel sounds are normal.  Musculoskeletal: Normal range of motion.  Neurological: She is alert and oriented to person, place, and time. She has normal reflexes.  Skin: Skin is warm and dry.  Psychiatric: She has a normal mood and affect.     Assessment/Plan [name] with sickle cell disease here was crisis.  #1 sickle cell painful crisis: Patient will be treated with IV Dilaudid, IV Toradol, IV fluids as well as Benadryl for itching. Once her pain level is down to tolerable levels of 4-5 she'll be discharged on her oral medications. Will be assessed regularly through the day that back pain level is achieved.  #2 Dehydration: We will aggressively hydrate the patient.  #3 depression: Patient is depressed and has a lot of stress. Counseling is provided and she will be advised to continue her antidepressants  Dawn [name],LAWAL 05/24/2014, 9:35 AM

## 2014-05-24 NOTE — Discharge Summary (Signed)
Physician Discharge Summary  JAYDON SOROKA LFY:[id number] DOB: 02-15-1981 DOA: 05/24/2014  PCP: [name],[name], MD  Admit date: 05/24/2014 Discharge date: 05/24/2014  Discharge Diagnoses:  Active Problems:   Sickle cell anemia with crisis   Discharge Condition: Stable  Disposition:  Home   Diet:Regular Wt Readings from Last 3 Encounters:  05/24/14 153 lb (69.4 kg)  03/28/14 154 lb (69.854 kg)  03/02/14 156 lb (70.761 kg)    Hospital Course:  Patient admitted to day infusion center for extended observation. She states that crisis started after her father had a possible stroke on last weekend. She states that she has been under a great deal of stress. Also, she has not been taking long acting pain medication for several months. She says that she does not hurt everyday and she has to be alert and oriented as a phlebotomist, so she only takes short acting medication when needed. Pain intensity on admission was 8/10 primarily to back and lower extremities described as constant and throbbing. She currently denies headache, chest pain, nausea, vomiting, or diarrhea.   Treatment plan during extended observation was outlined and reviewed by Dr. Jonelle Sidle. She was started on hypotonic IV fluids for cellular rehydration. She is currently tolerating oral fluids and she appears hydrated. She was also started on Toradol 15 mg for inflammation. Ms. Crutcher was started on Dilaudid PCA per weight based protocol with a loading dose of 1.5 mg. Throughout observation, Ms. Ow used a total of 11.98 mg with 24 demands and 21 deliveries.  She states that pain intensity has decreased to 4/10 and she can manage at home on current medication regimen. She was also given Oxycodone 10 mg times one 45 minutes prior to discharge. Discharged home with family in stable condition.    Patient was given Rx for Oxycodone 10 mg every 4 hours for moderate to severe pain #60. She was also given Phenergan 12.5 every 6 hours prior to  taking pain medications #30. Scheduled a follow-up visit to discuss medications further and for immunizations.  Discharge Exam:  Filed Vitals:   05/24/14 1530  BP: 138/74  Pulse: 95  Temp: 98.6 F (37 C)  Resp: 17   Filed Vitals:   05/24/14 1304 05/24/14 1331 05/24/14 1430 05/24/14 1530  BP:  111/51 119/76 138/74  Pulse:  82 79 95  Temp:   98.4 F (36.9 C) 98.6 F (37 C)  TempSrc:   Oral Oral  Resp: 19 13 15 17   Weight:      SpO2: 98% 98% 96% 95%     General: Alert, awake, oriented x3, in no acute distress.  Heart: Regular rate and rhythm, without murmurs, rubs, gallops, PMI non-displaced, no heaves or thrills on palpation.  Lungs: Clear to auscultation, no wheezing or rhonchi noted. No increased vocal fremitus resonant to percussion  Abdomen: Soft, nontender, nondistended, positive bowel sounds, no masses no hepatosplenomegaly noted..  Neuro: No focal neurological deficits noted cranial nerves II through XII grossly intact. DTRs 2+ bilaterally upper and lower extremities. Strength 5 out of 5 in bilateral upper and lower extremities. Musculoskeletal: No warm swelling or erythema around joints, no spinal tenderness noted. Psychiatric: Patient alert and oriented x3, good insight and cognition, good recent to remote recall. Lymph node survey: No cervical axillary or inguinal lymphadenopathy noted.   Discharge Instructions     Medication List         cetirizine 10 MG tablet  Commonly known as:  ZYRTEC  Take 1 tablet (10 mg  total) by mouth daily as needed. For allergies     folic acid 1 MG tablet  Commonly known as:  FOLVITE  Take 1 tablet (1 mg total) by mouth daily.     hydroxyurea 500 MG capsule  Commonly known as:  HYDREA  Take 1-2 capsules (500-1,000 mg total) by mouth as directed. Take 500mg  (1 tablet) ON ODD DAYS AND 1000 MG (2) TABLETS ON THE EVEN DAYS.     OxyCODONE 10 mg T12a 12 hr tablet  Commonly known as:  OXYCONTIN  Take 10 mg by mouth every 12 (twelve)  hours as needed (pain).     Oxycodone HCl 10 MG Tabs  Commonly known as:  ROXICODONE  Take 1 tablet (10 mg total) by mouth every 4 (four) hours as needed for severe pain.     Oxycodone HCl 10 MG Tabs  Take 1 tablet (10 mg total) by mouth every 4 (four) hours as needed (pain).     promethazine 12.5 MG tablet  Commonly known as:  PHENERGAN  Take 1 tablet (12.5 mg total) by mouth every 6 (six) hours as needed for nausea. 1-2 prn begin with 1 if no relief may repeat x's 1     Vitamin D (Ergocalciferol) 50000 UNITS Caps capsule  Commonly known as:  DRISDOL  Take 1 capsule (50,000 Units total) by mouth every 7 (seven) days. Takes on Sundays.          The results of significant diagnostics from this hospitalization (including imaging, microbiology, ancillary and laboratory) are listed below for reference.    Significant Diagnostic Studies: No results found.  Microbiology: No results found for this or any previous visit (from the past 240 hour(s)).   Labs: Basic Metabolic Panel:  Recent Labs Lab 05/24/14 0955  NA 140  K 3.7  CL 102  CO2 24  GLUCOSE 116*  BUN 5*  CREATININE 0.51  CALCIUM 9.2   Liver Function Tests:  Recent Labs Lab 05/24/14 0955  AST 93*  ALT 38*  ALKPHOS 140*  BILITOT 8.0*  PROT 8.3  ALBUMIN 3.5   No results found for this basename: LIPASE, AMYLASE,  in the last 168 hours No results found for this basename: AMMONIA,  in the last 168 hours CBC:  Recent Labs Lab 05/24/14 0955  WBC 6.1  NEUTROABS 3.1  HGB 9.4*  HCT 25.8*  MCV 110.3*  PLT 255   Cardiac Enzymes: No results found for this basename: CKTOTAL, CKMB, CKMBINDEX, TROPONINI,  in the last 168 hours BNP: No components found with this basename: POCBNP,  CBG: No results found for this basename: GLUCAP,  in the last 168 hours Ferritin: No results found for this basename: FERRITIN,  in the last 168 hours  Time coordinating discharge: Greater than 30  minutes  Signed:  Naomie Crow M  05/24/2014, 4:41 PM

## 2014-05-24 NOTE — Telephone Encounter (Signed)
Erroneous encounter, please disregard

## 2014-05-24 NOTE — Telephone Encounter (Signed)
Returned patient call and Instructed patient to come to day hospital. Patient acknowledges.

## 2014-05-24 NOTE — Telephone Encounter (Signed)
Received patient call c/o pain to back and legs since Friday night, rating pain 7/10. Patient reports taking pain medications with no relief. Patient denies all other pertinent ROS per [facility] Triage Assessment. Informed patient that this RN will notify provider on call and patient is to receive a return call back with recommendation. Patient acknowledges.

## 2014-05-24 NOTE — Telephone Encounter (Signed)
Meds ordered this encounter  Medications  . Oxycodone HCl 10 MG TABS    Sig: Take 1 tablet (10 mg total) by mouth every 4 (four) hours as needed (pain).    Dispense:  60 tablet    Refill:  0    Order Specific Question:  Supervising Provider    Answer:  [name], MICHELLE A [[id number]]  . promethazine (PHENERGAN) 12.5 MG tablet    Sig: Take 1 tablet (12.5 mg total) by mouth every 6 (six) hours as needed for nausea. 1-2 prn begin with 1 if no relief may repeat x's 1    Dispense:  30 tablet    Refill:  0    Order Specific Question:  Supervising Provider    Answer:  Liston Alba A [[id number]]  Reviewed [facility] [employer] Reporting system prior to reorder [name], FNP

## 2014-05-31 NOTE — Discharge Summary (Signed)
Patient was admitted to the sickle cell the hospital with sickle cell painful crisis. She was treated with IV Dilaudid, IV hydration, and Toradol. She was also counseled on depression symptoms be the trigger. At the time of discharge she was back to her baseline and feeling much better. She was advised to continue home medications. I agree with not as dictated by the nurse practitioner.

## 2014-06-12 ENCOUNTER — Encounter (HOSPITAL_COMMUNITY): Payer: Self-pay | Admitting: Emergency Medicine

## 2014-06-12 ENCOUNTER — Inpatient Hospital Stay (HOSPITAL_COMMUNITY)
Admission: EM | Admit: 2014-06-12 | Discharge: 2014-06-24 | DRG: 812 | Disposition: A | Discharge status: Home / Self Care | Payer: BC Managed Care – PPO | Attending: Internal Medicine | Admitting: Internal Medicine

## 2014-06-12 DIAGNOSIS — Z8249 Family history of ischemic heart disease and other diseases of the circulatory system: Secondary | ICD-10-CM

## 2014-06-12 DIAGNOSIS — I10 Essential (primary) hypertension: Secondary | ICD-10-CM | POA: Diagnosis not present

## 2014-06-12 DIAGNOSIS — F329 Major depressive disorder, single episode, unspecified: Secondary | ICD-10-CM

## 2014-06-12 DIAGNOSIS — R7989 Other specified abnormal findings of blood chemistry: Secondary | ICD-10-CM | POA: Diagnosis present

## 2014-06-12 DIAGNOSIS — D7589 Other specified diseases of blood and blood-forming organs: Secondary | ICD-10-CM | POA: Diagnosis present

## 2014-06-12 DIAGNOSIS — Z23 Encounter for immunization: Secondary | ICD-10-CM

## 2014-06-12 DIAGNOSIS — D571 Sickle-cell disease without crisis: Secondary | ICD-10-CM

## 2014-06-12 DIAGNOSIS — T451X5A Adverse effect of antineoplastic and immunosuppressive drugs, initial encounter: Secondary | ICD-10-CM | POA: Diagnosis present

## 2014-06-12 DIAGNOSIS — R040 Epistaxis: Secondary | ICD-10-CM | POA: Diagnosis present

## 2014-06-12 DIAGNOSIS — R9389 Abnormal findings on diagnostic imaging of other specified body structures: Secondary | ICD-10-CM

## 2014-06-12 DIAGNOSIS — M79605 Pain in left leg: Secondary | ICD-10-CM | POA: Diagnosis not present

## 2014-06-12 DIAGNOSIS — D5700 Hb-SS disease with crisis, unspecified: Principal | ICD-10-CM | POA: Diagnosis present

## 2014-06-12 DIAGNOSIS — E876 Hypokalemia: Secondary | ICD-10-CM

## 2014-06-12 DIAGNOSIS — Z833 Family history of diabetes mellitus: Secondary | ICD-10-CM

## 2014-06-12 DIAGNOSIS — B49 Unspecified mycosis: Secondary | ICD-10-CM

## 2014-06-12 DIAGNOSIS — E559 Vitamin D deficiency, unspecified: Secondary | ICD-10-CM | POA: Diagnosis present

## 2014-06-12 DIAGNOSIS — F411 Generalized anxiety disorder: Secondary | ICD-10-CM | POA: Diagnosis present

## 2014-06-12 DIAGNOSIS — R509 Fever, unspecified: Secondary | ICD-10-CM | POA: Diagnosis present

## 2014-06-12 DIAGNOSIS — E8809 Other disorders of plasma-protein metabolism, not elsewhere classified: Secondary | ICD-10-CM

## 2014-06-12 DIAGNOSIS — D72829 Elevated white blood cell count, unspecified: Secondary | ICD-10-CM

## 2014-06-12 DIAGNOSIS — R945 Abnormal results of liver function studies: Secondary | ICD-10-CM

## 2014-06-12 DIAGNOSIS — D5701 Hb-SS disease with acute chest syndrome: Secondary | ICD-10-CM

## 2014-06-12 DIAGNOSIS — Z803 Family history of malignant neoplasm of breast: Secondary | ICD-10-CM

## 2014-06-12 DIAGNOSIS — R0902 Hypoxemia: Secondary | ICD-10-CM

## 2014-06-12 LAB — CBC WITH DIFFERENTIAL/PLATELET
BLASTS: 0 %
Band Neutrophils: 0 % (ref 0–10)
Basophils Absolute: 0 10*3/uL (ref 0.0–0.1)
Basophils Relative: 0 % (ref 0–1)
Eosinophils Absolute: 0 10*3/uL (ref 0.0–0.7)
Eosinophils Relative: 0 % (ref 0–5)
HCT: 21.3 % — ABNORMAL LOW (ref 36.0–46.0)
Hemoglobin: 8 g/dL — ABNORMAL LOW (ref 12.0–15.0)
LYMPHS ABS: 3.3 10*3/uL (ref 0.7–4.0)
LYMPHS PCT: 44 % (ref 12–46)
MCH: 40.2 pg — ABNORMAL HIGH (ref 26.0–34.0)
MCHC: 37.6 g/dL — ABNORMAL HIGH (ref 30.0–36.0)
MCV: 107 fL — ABNORMAL HIGH (ref 78.0–100.0)
MONOS PCT: 6 % (ref 3–12)
Metamyelocytes Relative: 0 %
Monocytes Absolute: 0.5 10*3/uL (ref 0.1–1.0)
Myelocytes: 0 %
Neutro Abs: 3.7 10*3/uL (ref 1.7–7.7)
Neutrophils Relative %: 50 % (ref 43–77)
PLATELETS: 187 10*3/uL (ref 150–400)
PROMYELOCYTES ABS: 0 %
RBC: 1.99 MIL/uL — ABNORMAL LOW (ref 3.87–5.11)
RDW: 16.5 % — AB (ref 11.5–15.5)
WBC: 7.5 10*3/uL (ref 4.0–10.5)
nRBC: 3 /100 WBC — ABNORMAL HIGH

## 2014-06-12 LAB — COMPREHENSIVE METABOLIC PANEL
ALT: 39 U/L — AB (ref 0–35)
AST: 88 U/L — AB (ref 0–37)
Albumin: 3 g/dL — ABNORMAL LOW (ref 3.5–5.2)
Alkaline Phosphatase: 142 U/L — ABNORMAL HIGH (ref 39–117)
Anion gap: 10 (ref 5–15)
BILIRUBIN TOTAL: 7 mg/dL — AB (ref 0.3–1.2)
BUN: 5 mg/dL — ABNORMAL LOW (ref 6–23)
CHLORIDE: 102 meq/L (ref 96–112)
CO2: 26 meq/L (ref 19–32)
Calcium: 8.6 mg/dL (ref 8.4–10.5)
Creatinine, Ser: 0.52 mg/dL (ref 0.50–1.10)
GFR calc Af Amer: >90 mL/min (ref >90)
Glucose, Bld: 85 mg/dL (ref 70–99)
Potassium: 3.7 mEq/L (ref 3.7–5.3)
SODIUM: 138 meq/L (ref 137–147)
Total Protein: 7.3 g/dL (ref 6.0–8.3)

## 2014-06-12 LAB — RETICULOCYTES
RBC.: 1.99 MIL/uL — AB (ref 3.87–5.11)
Retic Count, Absolute: 427.9 10*3/uL — ABNORMAL HIGH (ref 19.0–186.0)
Retic Ct Pct: 21.5 % — ABNORMAL HIGH (ref 0.4–3.1)

## 2014-06-12 LAB — URINALYSIS, ROUTINE W REFLEX MICROSCOPIC
GLUCOSE, UA: NEGATIVE mg/dL
Ketones, ur: NEGATIVE mg/dL
Leukocytes, UA: NEGATIVE
Nitrite: NEGATIVE
PH: 6.5 (ref 5.0–8.0)
Protein, ur: 30 mg/dL — AB
Urobilinogen, UA: 1 mg/dL (ref 0.0–1.0)

## 2014-06-12 LAB — URINE MICROSCOPIC-ADD ON

## 2014-06-12 MED ORDER — HYDROXYUREA 500 MG PO CAPS
500.0000 mg | ORAL_CAPSULE | ORAL | Status: DC
  Administered 2014-06-14 – 2014-06-24 (×5): 500 mg via ORAL
  Filled 2014-06-12 (×6): qty 1

## 2014-06-12 MED ORDER — ONDANSETRON HCL 4 MG PO TABS
4.0000 mg | ORAL_TABLET | ORAL | Status: DC | PRN
  Filled 2014-06-12 (×3): qty 1

## 2014-06-12 MED ORDER — SODIUM CHLORIDE 0.9 % IV SOLN
INTRAVENOUS | Status: AC
  Administered 2014-06-12 – 2014-06-13 (×2): via INTRAVENOUS

## 2014-06-12 MED ORDER — ONDANSETRON HCL 4 MG/2ML IJ SOLN: 4.0000 mg | Freq: Three times a day (TID) | INTRAMUSCULAR | Status: DC | PRN

## 2014-06-12 MED ORDER — DIPHENHYDRAMINE HCL 50 MG/ML IJ SOLN
25.0000 mg | Freq: Once | INTRAMUSCULAR | Status: AC
  Administered 2014-06-12: 25 mg via INTRAVENOUS
  Filled 2014-06-12: qty 1

## 2014-06-12 MED ORDER — NALOXONE HCL 0.4 MG/ML IJ SOLN: 0.4000 mg | INTRAMUSCULAR | Status: DC | PRN

## 2014-06-12 MED ORDER — ONDANSETRON HCL 4 MG/2ML IJ SOLN: 4.0000 mg | Freq: Four times a day (QID) | INTRAMUSCULAR | Status: DC | PRN

## 2014-06-12 MED ORDER — HYDROMORPHONE HCL 2 MG/ML IJ SOLN
2.0000 mg | Freq: Once | INTRAMUSCULAR | Status: AC
  Administered 2014-06-12: 2 mg via INTRAVENOUS
  Filled 2014-06-12: qty 1

## 2014-06-12 MED ORDER — DIPHENHYDRAMINE HCL 50 MG/ML IJ SOLN
12.5000 mg | Freq: Four times a day (QID) | INTRAMUSCULAR | Status: DC | PRN
  Administered 2014-06-13 – 2014-06-21 (×22): 12.5 mg via INTRAVENOUS
  Filled 2014-06-12 (×25): qty 1

## 2014-06-12 MED ORDER — INFLUENZA VAC SPLIT QUAD 0.5 ML IM SUSY
0.5000 mL | PREFILLED_SYRINGE | INTRAMUSCULAR | Status: AC
  Administered 2014-06-13: 0.5 mL via INTRAMUSCULAR
  Filled 2014-06-12: qty 0.5

## 2014-06-12 MED ORDER — VITAMIN D (ERGOCALCIFEROL) 1.25 MG (50000 UNIT) PO CAPS
50000.0000 [IU] | ORAL_CAPSULE | ORAL | Status: DC
  Administered 2014-06-13 – 2014-06-20 (×2): 50000 [IU] via ORAL
  Filled 2014-06-12 (×3): qty 1

## 2014-06-12 MED ORDER — DIPHENHYDRAMINE HCL 12.5 MG/5ML PO ELIX
12.5000 mg | ORAL_SOLUTION | Freq: Four times a day (QID) | ORAL | Status: DC | PRN
  Filled 2014-06-12: qty 5

## 2014-06-12 MED ORDER — ENOXAPARIN SODIUM 40 MG/0.4ML ~~LOC~~ SOLN
40.0000 mg | Freq: Every day | SUBCUTANEOUS | Status: DC
  Administered 2014-06-12 – 2014-06-14 (×3): 40 mg via SUBCUTANEOUS
  Filled 2014-06-12 (×3): qty 0.4

## 2014-06-12 MED ORDER — HYDROXYUREA 500 MG PO CAPS
1000.0000 mg | ORAL_CAPSULE | ORAL | Status: DC
  Administered 2014-06-13 – 2014-06-23 (×7): 1000 mg via ORAL
  Filled 2014-06-12 (×9): qty 2

## 2014-06-12 MED ORDER — SODIUM CHLORIDE 0.9 % IV BOLUS (SEPSIS)
1000.0000 mL | Freq: Once | INTRAVENOUS | Status: AC
  Administered 2014-06-12: 1000 mL via INTRAVENOUS

## 2014-06-12 MED ORDER — PROMETHAZINE HCL 12.5 MG PO TABS
12.5000 mg | ORAL_TABLET | Freq: Four times a day (QID) | ORAL | Status: DC | PRN
  Administered 2014-06-13 – 2014-06-19 (×3): 12.5 mg via ORAL
  Filled 2014-06-12 (×4): qty 1

## 2014-06-12 MED ORDER — HYDROMORPHONE HCL 1 MG/ML IJ SOLN: 1.0000 mg | Freq: Once | INTRAMUSCULAR | Status: DC

## 2014-06-12 MED ORDER — OXYCODONE HCL 5 MG PO TABS: 10.0000 mg | ORAL_TABLET | ORAL | Status: DC | PRN

## 2014-06-12 MED ORDER — SODIUM CHLORIDE 0.9 % IJ SOLN: 9.0000 mL | INTRAMUSCULAR | Status: DC | PRN

## 2014-06-12 MED ORDER — FOLIC ACID 1 MG PO TABS: 1.0000 mg | ORAL_TABLET | Freq: Every day | ORAL | Status: DC

## 2014-06-12 MED ORDER — KETOROLAC TROMETHAMINE 30 MG/ML IJ SOLN
30.0000 mg | Freq: Four times a day (QID) | INTRAMUSCULAR | Status: DC
  Administered 2014-06-12 – 2014-06-15 (×12): 30 mg via INTRAVENOUS
  Filled 2014-06-12 (×12): qty 1

## 2014-06-12 MED ORDER — HYDROMORPHONE HCL 1 MG/ML IJ SOLN: 1.0000 mg | INTRAMUSCULAR | Status: DC | PRN

## 2014-06-12 MED ORDER — SODIUM CHLORIDE 0.9 % IV SOLN: INTRAVENOUS | Status: DC

## 2014-06-12 MED ORDER — HYDROMORPHONE 0.3 MG/ML IV SOLN
INTRAVENOUS | Status: DC
  Administered 2014-06-12 – 2014-06-14 (×5): via INTRAVENOUS
  Administered 2014-06-14: 3.3 mg via INTRAVENOUS
  Administered 2014-06-14: 4.5 mg via INTRAVENOUS
  Administered 2014-06-14: 3.72 mg via INTRAVENOUS
  Filled 2014-06-12 (×5): qty 25

## 2014-06-12 MED ORDER — FOLIC ACID 1 MG PO TABS
1.0000 mg | ORAL_TABLET | Freq: Every day | ORAL | Status: DC
  Administered 2014-06-13 – 2014-06-24 (×12): 1 mg via ORAL
  Filled 2014-06-12 (×12): qty 1

## 2014-06-12 MED ORDER — PROMETHAZINE HCL 25 MG/ML IJ SOLN
25.0000 mg | Freq: Once | INTRAMUSCULAR | Status: AC
  Administered 2014-06-12: 25 mg via INTRAVENOUS
  Filled 2014-06-12: qty 1

## 2014-06-12 MED ORDER — ONDANSETRON HCL 4 MG/2ML IJ SOLN
4.0000 mg | INTRAMUSCULAR | Status: DC | PRN
  Administered 2014-06-13 – 2014-06-23 (×17): 4 mg via INTRAVENOUS
  Filled 2014-06-12 (×17): qty 2

## 2014-06-12 NOTE — ED Provider Notes (Signed)
CSN: [id number]     Arrival date & time 06/12/14  1832 History   First MD Initiated Contact with Patient 06/12/14 1844     Chief Complaint  Patient presents with  . Sickle Cell Pain Crisis     (Consider location/radiation/quality/duration/timing/severity/associated sxs/prior Treatment) HPI Comments: 33 year old female, history of sickle cell anemia who presents with a complaint of pain. She states that this pain is primarily located in her lower back, her bilateral thighs and her right distal forearm and wrist. There has been no injuries, no swelling, she describes a fever of 99 but no coughing shortness of breath or chest pain. She denies dysuria, diarrhea, rashes and has no headaches, blurred vision, sore throat or any other complaints. She has been taking her preventative medication including hydroxyurea, folic acid and oxycodone 10 mg tablets but has not had relief. These pains started approximately one week ago, they have become gradually worse and more frequent and now the pain is unrelenting. She states that this pain is similar to her prior sickle cell pain in the past. Medical record review shows that the patient has had several visits over the last year but does not frequently use the emergency department. Her sickle cell physician is Dr. Zigmund Daniel.  Patient is a 33 y.o. female presenting with sickle cell pain. The history is provided by the patient.  Sickle Cell Pain Crisis   Past Medical History  Diagnosis Date  . Sickle cell disease   . Sickle cell disease, type S   . Blood transfusion   . 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 HCAP (healthcare-associated pneumonia) 05/19/2013   Past Surgical History  Procedure Laterality Date  . Cholecystectomy    .  left knee acl reconstruction    . Cesarean section      x 2  . Portacath placement    . Portacath placement Left 02/23/2013    Procedure: INSERTION PORT-A-CATH;  Surgeon: Donato Heinz, MD;  Location: [facility];  Service:  General;  Laterality: Left;  . Port-a-cath removal Left 07/29/2013    Procedure: REMOVAL PORT-A-CATH;  Surgeon: Scherry Ran, MD;  Location: [facility];  Service: General;  Laterality: Left;  . Tee without cardioversion N/A 08/07/2013    Procedure: TRANSESOPHAGEAL ECHOCARDIOGRAM (TEE);  Surgeon: Arnoldo Lenis, MD;  Location: [facility];  Service: Cardiology;  Laterality: N/A;   Family History  Problem Relation Age of Onset  . Sickle cell trait Mother   . Sickle cell trait Father   . Hypertension Father   . Diabetes Father   . Sickle cell trait Sister   . Cancer Paternal Aunt     Breast   History  Substance Use Topics  . Smoking status: Never Smoker   . Smokeless tobacco: Not on file  . Alcohol Use: No   OB History   Grav Para Term Preterm Abortions TAB SAB Ect Mult Living                 Review of Systems  All other systems reviewed and are negative.     Allergies  Desferal; Latex; Lisinopril; and Tape  Home Medications   Prior to Admission medications   Medication Sig Start Date End Date Taking? Authorizing Provider  cetirizine (ZYRTEC) 10 MG tablet Take 10 mg by mouth daily.   Yes Historical Provider, MD  folic acid (FOLVITE) 1 MG tablet Take 1 tablet (1 mg total) by mouth daily. 03/31/14  Yes [name], FNP  hydroxyurea (HYDREA) 500  MG capsule Take 1-2 capsules (500-1,000 mg total) by mouth as directed. Take 500mg  (1 tablet) ON ODD DAYS AND 1000 MG (2) TABLETS ON THE EVEN DAYS. 03/31/14  Yes [name], FNP  Oxycodone HCl 10 MG TABS Take 1 tablet (10 mg total) by mouth every 4 (four) hours as needed (pain). 05/24/14  Yes [name], FNP  promethazine (PHENERGAN) 12.5 MG tablet Take 1 tablet (12.5 mg total) by mouth every 6 (six) hours as needed for nausea. 1-2 prn begin with 1 if no relief may repeat x's 1 05/24/14  Yes [name], FNP  Vitamin D, Ergocalciferol, (DRISDOL) 50000 UNITS CAPS capsule Take 1 capsule (50,000 Units total) by mouth  every 7 (seven) days. Takes on Sundays. 03/31/14  Yes [name], FNP   BP 134/79  Pulse 101  Temp(Src) 98.9 F (37.2 C) (Oral)  Resp 18  Ht 5\' 3"  (1.6 m)  Wt 154 lb (69.854 kg)  BMI 27.29 kg/m2  SpO2 97% Physical Exam  Nursing note and vitals reviewed. Constitutional: She appears well-developed and well-nourished.  Tearful  HENT:  Head: Normocephalic and atraumatic.  Mouth/Throat: Oropharynx is clear and moist. No oropharyngeal exudate.  Eyes: Conjunctivae and EOM are normal. Pupils are equal, round, and reactive to light. Right eye exhibits no discharge. Left eye exhibits no discharge. No scleral icterus.  Neck: Normal range of motion. Neck supple. No JVD present. No thyromegaly present.  Cardiovascular: Normal rate, regular rhythm, normal heart sounds and intact distal pulses.  Exam reveals no gallop and no friction rub.   No murmur heard. Normal dorsalis pedis pulses bilaterally  Pulmonary/Chest: Effort normal and breath sounds normal. No respiratory distress. She has no wheezes. She has no rales.  Abdominal: Soft. Bowel sounds are normal. She exhibits no distension and no mass. There is no tenderness.  Musculoskeletal: Normal range of motion. She exhibits tenderness (tender to palpation over the bilateral anterior thighs). She exhibits no edema.  No tenderness to palpation over the lumbar spine, no swelling rashes or deformities to the 4 extremities, no peripheral edema  Lymphadenopathy:    She has no cervical adenopathy.  Neurological: She is alert. Coordination normal.  Skin: Skin is warm and dry. No rash noted. No erythema.  Psychiatric: She has a normal mood and affect. Her behavior is normal.    ED Course  Procedures (including critical care time) Labs Review Labs Reviewed  CBC WITH DIFFERENTIAL - Abnormal; Notable for the following:    RBC 1.99 (*)    Hemoglobin 8.0 (*)    HCT 21.3 (*)    MCV 107.0 (*)    MCH 40.2 (*)    MCHC 37.6 (*)    RDW 16.5 (*)     nRBC 3 (*)    All other components within normal limits  COMPREHENSIVE METABOLIC PANEL - Abnormal; Notable for the following:    BUN 5 (*)    Albumin 3.0 (*)    AST 88 (*)    ALT 39 (*)    Alkaline Phosphatase 142 (*)    Total Bilirubin 7.0 (*)    All other components within normal limits  RETICULOCYTES - Abnormal; Notable for the following:    Retic Ct Pct 21.5 (*)    RBC. 1.99 (*)    Retic Count, Manual 427.9 (*)    All other components within normal limits  URINALYSIS, ROUTINE W REFLEX MICROSCOPIC    Imaging Review No results found.    MDM   Final  diagnoses:  Sickle cell crisis    The patient has no signs of acute chest syndrome, no fever, normal vital signs, no tachycardia on my exam. Check urinalysis as the patient does have some back pain, there is no signs of acute osteomyelitis, avascular necrosis or any other sources of infection. Pain medications ordered, fluids, antiemetics. The patient is in agreement with pain control and investigative lab studies.  No sig improvement - pain from 10/10 to 7/10 after 6mg  of dilaudid over 3 doses.  She has no change in level of consciousness, labs reassuring but show increased retic count - d/w hospitalist Dr. Shanon Brow who will admit.  Meds given in ED:  Medications  sodium chloride 0.9 % bolus 1,000 mL (1,000 mLs Intravenous New Bag/Given 06/12/14 1925)  diphenhydrAMINE (BENADRYL) injection 25 mg (25 mg Intravenous Given 06/12/14 1924)  promethazine (PHENERGAN) injection 25 mg (25 mg Intravenous Given 06/12/14 1924)  HYDROmorphone (DILAUDID) injection 2 mg (2 mg Intravenous Given 06/12/14 1924)  HYDROmorphone (DILAUDID) injection 2 mg (2 mg Intravenous Given 06/12/14 1956)  HYDROmorphone (DILAUDID) injection 2 mg (2 mg Intravenous Given 06/12/14 2116)    New Prescriptions   No medications on file      Johnna Acosta, MD 06/12/14 2134

## 2014-06-12 NOTE — H&P (Signed)
PCP:   Dawn A., MD   Chief Complaint:  Sickle cell pain  HPI: 33 yo female with several days of her routine sickle cell pain mainly in her legs that has not been relieved with her routine home pain meds.  No fevers.  No sob.  No cough.  No dysuria.  No chest pain.  Pain some better but has received dilaudid 6mg  total iv over the last several hours and her pain is still 7/10.  Review of Systems:  Positive and negative as per HPI otherwise all other systems are negative  Past Medical History: Past Medical History  Diagnosis Date  . Sickle cell disease   . Sickle cell disease, type S   . Blood transfusion   . 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 HCAP (healthcare-associated pneumonia) 05/19/2013   Past Surgical History  Procedure Laterality Date  . Cholecystectomy    .  left knee acl reconstruction    . Cesarean section      x 2  . Portacath placement    . Portacath placement Left 02/23/2013    Procedure: INSERTION PORT-A-CATH;  Surgeon: Donato Heinz, MD;  Location: [facility];  Service: General;  Laterality: Left;  . Port-a-cath removal Left 07/29/2013    Procedure: REMOVAL PORT-A-CATH;  Surgeon: Scherry Ran, MD;  Location: [facility];  Service: General;  Laterality: Left;  . Tee without cardioversion N/A 08/07/2013    Procedure: TRANSESOPHAGEAL ECHOCARDIOGRAM (TEE);  Surgeon: Arnoldo Lenis, MD;  Location: [facility];  Service: Cardiology;  Laterality: N/A;    Medications: Prior to Admission medications   Medication Sig Start Date End Date Taking? Authorizing Provider  cetirizine (ZYRTEC) 10 MG tablet Take 10 mg by mouth daily.   Yes Historical Provider, MD  folic acid (FOLVITE) 1 MG tablet Take 1 tablet (1 mg total) by mouth daily. 03/31/14  Yes [name], FNP  hydroxyurea (HYDREA) 500 MG capsule Take 1-2 capsules (500-1,000 mg total) by mouth as directed. Take 500mg  (1 tablet) ON ODD DAYS AND 1000 MG (2) TABLETS ON THE EVEN DAYS. 03/31/14  Yes  [name], FNP  Oxycodone HCl 10 MG TABS Take 1 tablet (10 mg total) by mouth every 4 (four) hours as needed (pain). 05/24/14  Yes [name], FNP  promethazine (PHENERGAN) 12.5 MG tablet Take 1 tablet (12.5 mg total) by mouth every 6 (six) hours as needed for nausea. 1-2 prn begin with 1 if no relief may repeat x's 1 05/24/14  Yes [name], FNP  Vitamin D, Ergocalciferol, (DRISDOL) 50000 UNITS CAPS capsule Take 1 capsule (50,000 Units total) by mouth every 7 (seven) days. Takes on Sundays. 03/31/14  Yes [name], FNP    Allergies:   Allergies  Allergen Reactions  . Desferal [Deferoxamine] Hives    Local reaction on arm only during infusion. Can take with benadryl   . Latex Other (See Comments)    REACTION: Pt experiences a burning sensation on contacted skin areas  . Lisinopril Other (See Comments) and Cough    REACTION: Sore/scratchy throat  . Tape Other (See Comments)    REACTION: Pt. Experiences a burning sensation on contacted skin areas    Social History:  reports that she has never smoked. She does not have any smokeless tobacco history on file. She reports that she does not drink alcohol or use illicit drugs.  Family History: Family History  Problem Relation Age of Onset  . Sickle cell trait Mother   .  Sickle cell trait Father   . Hypertension Father   . Diabetes Father   . Sickle cell trait Sister   . Cancer Paternal Aunt     Breast    Physical Exam: Filed Vitals:   06/12/14 1841  BP: 134/79  Pulse: 101  Temp: 98.9 F (37.2 C)  TempSrc: Oral  Resp: 18  Height: 5\' 3"  (1.6 m)  Weight: 69.854 kg (154 lb)  SpO2: 97%   General appearance: alert, cooperative and mild distress Head: Normocephalic, without obvious abnormality, atraumatic Eyes: negative Nose: Nares normal. Septum midline. Mucosa normal. No drainage or sinus tenderness. Neck: no JVD and supple, symmetrical, trachea midline Lungs: clear to auscultation bilaterally Heart:  regular rate and rhythm, S1, S2 normal, no murmur, click, rub or gallop Abdomen: soft, non-tender; bowel sounds normal; no masses,  no organomegaly Extremities: extremities normal, atraumatic, no cyanosis or edema Pulses: 2+ and symmetric Skin: Skin color, texture, turgor normal. No rashes or lesions Neurologic: Grossly normal   Labs on Admission:   Recent Labs  06/12/14 2014  NA 138  K 3.7  CL 102  CO2 26  GLUCOSE 85  BUN 5*  CREATININE 0.52  CALCIUM 8.6    Recent Labs  06/12/14 2014  AST 88*  ALT 39*  ALKPHOS 142*  BILITOT 7.0*  PROT 7.3  ALBUMIN 3.0*    Recent Labs  06/12/14 2014  WBC 7.5  NEUTROABS 3.7  HGB 8.0*  HCT 21.3*  MCV 107.0*  PLT 187    Recent Labs  06/12/14 2014  RETICCTPCT 21.5*    Radiological Exams on Admission: No results found.  Assessment/Plan  33 yo female with sickle cell pain crisis  Principal Problem:   Sickle cell anemia with crisis-  Aggressive ivf overnight.  Place on dilaudid pca pump overnight to hopefully get her pain better controlled by the morning.  obs on med surg.  Limit transfusion if possible due to her h/o liver iron overload.  No evidence of acute chest syndrome.  Active Problems:  Stable unless o/w noted   Elevated LFTs   HTN (hypertension)   Transfusion associated hemochromatosis   Vitamin D deficiency   Sickle cell crisis  obs on med surg.  Full code.  Adjust pca later if need.    Dawn [name] A 06/12/2014, 9:41 PM

## 2014-06-12 NOTE — ED Notes (Addendum)
Pt states she has been under stress and that sometime weather changes can trigger a sickle cells crisis for her, Pt reports worst pain is in lower back and legs. Pt state that RX pain meds at home have not been effective in controlling her pain.

## 2014-06-13 DIAGNOSIS — I517 Cardiomegaly: Secondary | ICD-10-CM | POA: Diagnosis not present

## 2014-06-13 DIAGNOSIS — D7589 Other specified diseases of blood and blood-forming organs: Secondary | ICD-10-CM | POA: Diagnosis present

## 2014-06-13 DIAGNOSIS — R509 Fever, unspecified: Secondary | ICD-10-CM | POA: Diagnosis present

## 2014-06-13 DIAGNOSIS — M79605 Pain in left leg: Secondary | ICD-10-CM | POA: Diagnosis present

## 2014-06-13 DIAGNOSIS — T451X5A Adverse effect of antineoplastic and immunosuppressive drugs, initial encounter: Secondary | ICD-10-CM | POA: Diagnosis present

## 2014-06-13 DIAGNOSIS — E559 Vitamin D deficiency, unspecified: Secondary | ICD-10-CM | POA: Diagnosis not present

## 2014-06-13 DIAGNOSIS — J811 Chronic pulmonary edema: Secondary | ICD-10-CM | POA: Diagnosis not present

## 2014-06-13 DIAGNOSIS — R7989 Other specified abnormal findings of blood chemistry: Secondary | ICD-10-CM | POA: Diagnosis not present

## 2014-06-13 DIAGNOSIS — R040 Epistaxis: Secondary | ICD-10-CM | POA: Diagnosis present

## 2014-06-13 DIAGNOSIS — Z833 Family history of diabetes mellitus: Secondary | ICD-10-CM | POA: Diagnosis not present

## 2014-06-13 DIAGNOSIS — E876 Hypokalemia: Secondary | ICD-10-CM | POA: Diagnosis not present

## 2014-06-13 DIAGNOSIS — Z23 Encounter for immunization: Secondary | ICD-10-CM | POA: Diagnosis not present

## 2014-06-13 DIAGNOSIS — F411 Generalized anxiety disorder: Secondary | ICD-10-CM | POA: Diagnosis present

## 2014-06-13 DIAGNOSIS — Z803 Family history of malignant neoplasm of breast: Secondary | ICD-10-CM | POA: Diagnosis not present

## 2014-06-13 DIAGNOSIS — D571 Sickle-cell disease without crisis: Secondary | ICD-10-CM | POA: Diagnosis not present

## 2014-06-13 DIAGNOSIS — Z8249 Family history of ischemic heart disease and other diseases of the circulatory system: Secondary | ICD-10-CM | POA: Diagnosis not present

## 2014-06-13 DIAGNOSIS — D5700 Hb-SS disease with crisis, unspecified: Secondary | ICD-10-CM | POA: Diagnosis not present

## 2014-06-13 DIAGNOSIS — D5701 Hb-SS disease with acute chest syndrome: Secondary | ICD-10-CM | POA: Diagnosis not present

## 2014-06-13 DIAGNOSIS — I10 Essential (primary) hypertension: Secondary | ICD-10-CM | POA: Diagnosis not present

## 2014-06-13 LAB — CBC WITH DIFFERENTIAL/PLATELET
Basophils Absolute: 0 10*3/uL (ref 0.0–0.1)
Basophils Relative: 0 % (ref 0–1)
EOS ABS: 0.1 10*3/uL (ref 0.0–0.7)
Eosinophils Relative: 1 % (ref 0–5)
HCT: 21.6 % — ABNORMAL LOW (ref 36.0–46.0)
Hemoglobin: 7.7 g/dL — ABNORMAL LOW (ref 12.0–15.0)
LYMPHS PCT: 47 % — AB (ref 12–46)
Lymphs Abs: 4.5 10*3/uL — ABNORMAL HIGH (ref 0.7–4.0)
MCH: 38.5 pg — AB (ref 26.0–34.0)
MCHC: 35.6 g/dL (ref 30.0–36.0)
MCV: 108 fL — ABNORMAL HIGH (ref 78.0–100.0)
MONO ABS: 1.3 10*3/uL — AB (ref 0.1–1.0)
Monocytes Relative: 14 % — ABNORMAL HIGH (ref 3–12)
Neutro Abs: 3.6 10*3/uL (ref 1.7–7.7)
Neutrophils Relative %: 38 % — ABNORMAL LOW (ref 43–77)
PLATELETS: 169 10*3/uL (ref 150–400)
RBC: 2 MIL/uL — ABNORMAL LOW (ref 3.87–5.11)
RDW: 16.3 % — ABNORMAL HIGH (ref 11.5–15.5)
WBC: 9.5 10*3/uL (ref 4.0–10.5)

## 2014-06-13 LAB — COMPREHENSIVE METABOLIC PANEL
ALT: 36 U/L — AB (ref 0–35)
AST: 78 U/L — AB (ref 0–37)
Albumin: 2.7 g/dL — ABNORMAL LOW (ref 3.5–5.2)
Alkaline Phosphatase: 138 U/L — ABNORMAL HIGH (ref 39–117)
Anion gap: 10 (ref 5–15)
BUN: 5 mg/dL — ABNORMAL LOW (ref 6–23)
CALCIUM: 8.1 mg/dL — AB (ref 8.4–10.5)
CO2: 24 mEq/L (ref 19–32)
CREATININE: 0.5 mg/dL (ref 0.50–1.10)
Chloride: 103 mEq/L (ref 96–112)
GFR calc Af Amer: >90 mL/min (ref >90)
GFR calc non Af Amer: >90 mL/min (ref >90)
Glucose, Bld: 94 mg/dL (ref 70–99)
Potassium: 3.7 mEq/L (ref 3.7–5.3)
SODIUM: 137 meq/L (ref 137–147)
Total Bilirubin: 6.6 mg/dL — ABNORMAL HIGH (ref 0.3–1.2)
Total Protein: 6.8 g/dL (ref 6.0–8.3)

## 2014-06-13 MED ORDER — OXYCODONE HCL 5 MG PO TABS
10.0000 mg | ORAL_TABLET | Freq: Four times a day (QID) | ORAL | Status: DC
  Administered 2014-06-13 – 2014-06-17 (×16): 10 mg via ORAL
  Filled 2014-06-13 (×16): qty 2

## 2014-06-13 MED ORDER — SODIUM CHLORIDE 0.9 % IV SOLN
INTRAVENOUS | Status: DC
  Administered 2014-06-13 – 2014-06-14 (×3): via INTRAVENOUS

## 2014-06-13 MED ORDER — SENNOSIDES-DOCUSATE SODIUM 8.6-50 MG PO TABS
1.0000 | ORAL_TABLET | Freq: Two times a day (BID) | ORAL | Status: DC
  Administered 2014-06-13 – 2014-06-21 (×16): 1 via ORAL
  Filled 2014-06-13 (×21): qty 1

## 2014-06-13 NOTE — Progress Notes (Signed)
[facility] PROGRESS NOTE  Dawn [name] CHY:[id number] DOB: 1980-10-20 DOA: 06/12/2014 PCP: [name],[name], MD    Code Status: Full code Family Communication: Family not available Disposition Plan: Discharge to home when clinically appropriate   Consultants:  None  Procedures:  None  Antibiotics:  None  HPI/Subjective: The patient continues to have moderate bilateral leg pain and lower back pain. The Dilaudid PCA is helping, but not lasting as long as she would hope for. She denies chest pain or shortness of breath. She denies abdominal pain, nausea, or vomiting.  Objective: Filed Vitals:   06/13/14 1223  BP:   Pulse:   Temp:   Resp: 18   temperature 97.8. Pulse 86. Rest her rate 18. Blood pressure 110/73. Oxygen saturation 98% on nasal cannula oxygen.  Intake/Output Summary (Last 24 hours) at 06/13/14 1241 Last data filed at 06/13/14 0044  Gross per 24 hour  Intake      0 ml  Output    400 ml  Net   -400 ml   Filed Weights   06/12/14 1841 06/12/14 2223  Weight: 69.854 kg (154 lb) 69.854 kg (154 lb)    Exam:   General: Pleasant 33 year old African-American woman sitting up in bed, in no acute distress.  Cardiovascular: S1, S2, with a soft systolic murmur.  Respiratory: Clear to auscultation bilaterally.  Abdomen: Positive bowel sounds, soft, nontender, nondistended.  Musculoskeletal/extremities: Mild diffuse tenderness upon palpation of lower extremities. No pedal edema or pretibial edema. No acute hot joints.   Data Reviewed: Basic Metabolic Panel:  Recent Labs Lab 06/12/14 2014 06/13/14 0756  NA 138 137  K 3.7 3.7  CL 102 103  CO2 26 24  GLUCOSE 85 94  BUN 5* 5*  CREATININE 0.52 0.50  CALCIUM 8.6 8.1*   Liver Function Tests:  Recent Labs Lab 06/12/14 2014 06/13/14 0756  AST 88* 78*  ALT 39* 36*  ALKPHOS 142* 138*  BILITOT 7.0* 6.6*  PROT 7.3 6.8  ALBUMIN 3.0* 2.7*   No results found for this basename: LIPASE,  AMYLASE,  in the last 168 hours No results found for this basename: AMMONIA,  in the last 168 hours CBC:  Recent Labs Lab 06/12/14 2014 06/13/14 0608  WBC 7.5 9.5  NEUTROABS 3.7 3.6  HGB 8.0* 7.7*  HCT 21.3* 21.6*  MCV 107.0* 108.0*  PLT 187 169   Cardiac Enzymes: No results found for this basename: CKTOTAL, CKMB, CKMBINDEX, TROPONINI,  in the last 168 hours BNP (last 3 results) No results found for this basename: PROBNP,  in the last 8760 hours CBG: No results found for this basename: GLUCAP,  in the last 168 hours  No results found for this or any previous visit (from the past 240 hour(s)).   Studies: No results found.  Scheduled Meds: . enoxaparin (LOVENOX) injection  40 mg Subcutaneous QHS  . folic acid  1 mg Oral Daily  . HYDROmorphone PCA 0.3 mg/mL   Intravenous 6 times per day  . hydroxyurea  1,000 mg Oral QODAY  . [START ON 06/14/2014] hydroxyurea  500 mg Oral QODAY  . ketorolac  30 mg Intravenous 4 times per day  . Vitamin D (Ergocalciferol)  50,000 Units Oral Q7 days   Continuous Infusions:   Assessment and plan:  Principal Problem:   Sickle cell anemia with crisis Active Problems:   Elevated LFTs   HTN (hypertension)   Transfusion associated hemochromatosis   Vitamin D deficiency   Sickle cell crisis   Macrocytosis  1. Sickle cell anemia with crisis. We'll continue IV fluid hydration, hydroxyurea, and PCA Dilaudid. Also continue Toradol IV 4 times daily for the next 24-48 hours. We'll start oral oxycodone for times a day, scheduled. We'll start Senokot S. twice a day to hopefully avoid opiate-induced constipation. Her hemoglobin has decreased, likely from both sickling and hemodilution. Patient says that her baseline hemoglobin is in the 9 range. We'll hold off on transfusion unless her hemoglobin falls to 7 or below. Will add Pepcid empirically while she is on Toradol. We'll continue to follow her CBC. We'll order a reticulocyte count tomorrow  morning. Macrocytosis, likely secondary to sickle cell anemia and/or hydroxyurea. Continue folic acid. Elevated LFTs. This is secondary to sickle cell crisis. We'll continue to monitor. Vitamin D deficiency. Continue vitamin D supplementation.  Time spent: 30 minutes    [facility] Pager [phone]. If 7PM-7AM, please contact night-coverage at [email or web address], password [facility] 06/13/2014, 12:41 PM  LOS: 1 day

## 2014-06-13 NOTE — Progress Notes (Signed)
UR completed 

## 2014-06-14 ENCOUNTER — Inpatient Hospital Stay (HOSPITAL_COMMUNITY): Payer: BC Managed Care – PPO

## 2014-06-14 LAB — URINALYSIS, ROUTINE W REFLEX MICROSCOPIC
Bilirubin Urine: NEGATIVE
Glucose, UA: NEGATIVE mg/dL
KETONES UR: NEGATIVE mg/dL
LEUKOCYTES UA: NEGATIVE
NITRITE: NEGATIVE
PH: 6 (ref 5.0–8.0)
Urobilinogen, UA: 0.2 mg/dL (ref 0.0–1.0)

## 2014-06-14 LAB — BASIC METABOLIC PANEL
ANION GAP: 10 (ref 5–15)
BUN: 8 mg/dL (ref 6–23)
CHLORIDE: 107 meq/L (ref 96–112)
CO2: 24 meq/L (ref 19–32)
CREATININE: 0.59 mg/dL (ref 0.50–1.10)
Calcium: 8 mg/dL — ABNORMAL LOW (ref 8.4–10.5)
GFR calc Af Amer: >90 mL/min (ref >90)
GFR calc non Af Amer: >90 mL/min (ref >90)
GLUCOSE: 101 mg/dL — AB (ref 70–99)
Potassium: 3.8 mEq/L (ref 3.7–5.3)
Sodium: 141 mEq/L (ref 137–147)

## 2014-06-14 LAB — RETICULOCYTES
RBC.: 1.88 MIL/uL — ABNORMAL LOW (ref 3.87–5.11)
Retic Ct Pct: >23 % — ABNORMAL HIGH (ref 0.4–3.1)

## 2014-06-14 LAB — CBC
HEMATOCRIT: 20.4 % — AB (ref 36.0–46.0)
Hemoglobin: 7.4 g/dL — ABNORMAL LOW (ref 12.0–15.0)
MCH: 39.4 pg — ABNORMAL HIGH (ref 26.0–34.0)
MCHC: 36.3 g/dL — ABNORMAL HIGH (ref 30.0–36.0)
MCV: 108.5 fL — AB (ref 78.0–100.0)
Platelets: 228 10*3/uL (ref 150–400)
RBC: 1.88 MIL/uL — AB (ref 3.87–5.11)
RDW: 19.2 % — ABNORMAL HIGH (ref 11.5–15.5)
WBC: 11.3 10*3/uL — AB (ref 4.0–10.5)

## 2014-06-14 LAB — URINE MICROSCOPIC-ADD ON

## 2014-06-14 MED ORDER — HYDROMORPHONE 0.3 MG/ML IV SOLN
INTRAVENOUS | Status: DC
  Administered 2014-06-14: 0.3 mg via INTRAVENOUS
  Administered 2014-06-14: 18:00:00 via INTRAVENOUS
  Administered 2014-06-14: 2.2 mg via INTRAVENOUS
  Administered 2014-06-15: 0.3 mg via INTRAVENOUS
  Administered 2014-06-15: 11.03 mg via INTRAVENOUS
  Administered 2014-06-15: 4.99 mg via INTRAVENOUS
  Administered 2014-06-15: 9.27 mg via INTRAVENOUS
  Administered 2014-06-16: 08:00:00 via INTRAVENOUS
  Administered 2014-06-16: 0.5 mg via INTRAVENOUS
  Filled 2014-06-14 (×6): qty 25

## 2014-06-14 MED ORDER — SODIUM CHLORIDE 0.45 % IV SOLN
INTRAVENOUS | Status: DC
  Administered 2014-06-14 – 2014-06-16 (×3): via INTRAVENOUS

## 2014-06-14 MED ORDER — LEVOFLOXACIN 500 MG PO TABS
500.0000 mg | ORAL_TABLET | Freq: Every day | ORAL | Status: DC
  Administered 2014-06-14 – 2014-06-19 (×6): 500 mg via ORAL
  Filled 2014-06-14 (×6): qty 1

## 2014-06-14 MED ORDER — ACETAMINOPHEN 325 MG PO TABS
650.0000 mg | ORAL_TABLET | Freq: Four times a day (QID) | ORAL | Status: DC | PRN
  Administered 2014-06-14: 650 mg via ORAL
  Filled 2014-06-14 (×2): qty 2

## 2014-06-14 NOTE — Progress Notes (Signed)
The patient was seen and examined. She was discussed with nurse practitioner Ms. Black. Agree with her assessment and plan with additions below.  The patient does not believe the PCA Dilaudid is helping. She continues to receive Toradol IV 4 times daily for the next 24 hours and oral oxycodone 4 times daily-scheduled. Will therefore increase the PCA Dilaudid maximum dose to 2 mg per hour up from 1.25 mg per hour. Will add MiraLax twice a day to Senokot S to avoid opiate-induced constipation. We'll also change her IV fluids to half-normal saline.  Her hemoglobin has decreased. We'll monitor for the need to transfuse if her hemoglobin falls to at 7 or below.   She has a fever of unknown origin, not unusual in a sickle cell patient. Levaquin has been added empirically. Blood cultures were ordered and are pending. Her chest x-ray and urinalysis were unremarkable.  Elevated liver transaminases and hyperbilirubinemia are stable without increasing. Reticulocyte count percentage has increased. Will continue to monitor.

## 2014-06-14 NOTE — Progress Notes (Signed)
[facility] PROGRESS NOTE  Dawn [name] IWP:[id number] DOB: 1981-01-26 DOA: 06/12/2014 PCP: [name],[name], MD  Assessment/Plan: 1. Sickle cell anemia with crisis.   Only fair pain control. Continue IV fluid hydration, hydroxyurea, and PCA Dilaudid with Toradol IV 4 times daily for the next 24 hours and  oral oxycodone 4 times a day, scheduled. Continue Senokot S. twice a day to avoid opiate-induced constipation.  Reticulocyte level >23.0. Hemoglobin trending down, likely from both sickling and hemodilution. Consider transfusion if hemoglobin falls to 7 or below. Continue to follow her CBC.    Macrocytosis, likely secondary to sickle cell anemia and/or hydroxyurea.  Continue folic acid.   Fever: Etiology uncertain. Urinalysis unremarkable, chest xray with no acute process, blood cultures pending. May be reactive. She does have dry cough and diminished BS. Will provide levaquin empirically. Tylenol. Monitor closely.   Elevated LFTs.  This is secondary to sickle cell crisis. Recheck in am   Vitamin D deficiency.  Continue vitamin D supplementation.  Code Status: full Family Communication: mother at beside Disposition Plan: home when ready   Consultants:  none  Procedures:  none  Antibiotics:  none  HPI/Subjective: Sitting in chair. Reports pain remains 6/10. Reports pain mostly in lower back and legs.   Objective: Filed Vitals:   06/14/14 0843  BP:   Pulse:   Temp: 100.8 F (38.2 C)  Resp:     Intake/Output Summary (Last 24 hours) at 06/14/14 1035 Last data filed at 06/14/14 0659  Gross per 24 hour  Intake   1440 ml  Output   3800 ml  Net  -2360 ml   Filed Weights   06/12/14 1841 06/12/14 2223  Weight: 69.854 kg (154 lb) 69.854 kg (154 lb)    Exam:   General:  Well nourished appears somewhat uncomfortable  Cardiovascular: S1 and S2. i hear no gallup or rub. No LE edema  Respiratory: normal effort somewhat shallow, BS clear i hear no wheeze  or rhonchi  Abdomen: obese soft +BS non-tender to palpation  Musculoskeletal: joints without swelling/erythema. Decreased ROM bilateral knees/hips secondary to pain.    Data Reviewed: Basic Metabolic Panel:  Recent Labs Lab 06/12/14 2014 06/13/14 0756 06/14/14 0834  NA 138 137 141  K 3.7 3.7 3.8  CL 102 103 107  CO2 26 24 24   GLUCOSE 85 94 101*  BUN 5* 5* 8  CREATININE 0.52 0.50 0.59  CALCIUM 8.6 8.1* 8.0*   Liver Function Tests:  Recent Labs Lab 06/12/14 2014 06/13/14 0756  AST 88* 78*  ALT 39* 36*  ALKPHOS 142* 138*  BILITOT 7.0* 6.6*  PROT 7.3 6.8  ALBUMIN 3.0* 2.7*   No results found for this basename: LIPASE, AMYLASE,  in the last 168 hours No results found for this basename: AMMONIA,  in the last 168 hours CBC:  Recent Labs Lab 06/12/14 2014 06/13/14 0608 06/14/14 0834  WBC 7.5 9.5 11.3*  NEUTROABS 3.7 3.6  --   HGB 8.0* 7.7* 7.4*  HCT 21.3* 21.6* 20.4*  MCV 107.0* 108.0* 108.5*  PLT 187 169 228   Cardiac Enzymes: No results found for this basename: CKTOTAL, CKMB, CKMBINDEX, TROPONINI,  in the last 168 hours BNP (last 3 results) No results found for this basename: PROBNP,  in the last 8760 hours CBG: No results found for this basename: GLUCAP,  in the last 168 hours  Recent Results (from the past 240 hour(s))  CULTURE, BLOOD (ROUTINE X 2)     Status: None  Collection Time    06/14/14  8:45 AM      Result Value Ref Range Status   Specimen Description BLOOD RIGHT HAND   Final   Special Requests     Final   Value: BOTTLES DRAWN AEROBIC AND ANAEROBIC 6CC EACH BOTTLE   Culture NO GROWTH <24 HRS   Final   Report Status PENDING   Incomplete  CULTURE, BLOOD (ROUTINE X 2)     Status: None   Collection Time    06/14/14  8:45 AM      Result Value Ref Range Status   Specimen Description BLOOD RIGHT ARM   Final   Special Requests     Final   Value: BOTTLES DRAWN AEROBIC AND ANAEROBIC 6CC EACH BOTTLE   Culture NO GROWTH <24 HRS   Final   Report  Status PENDING   Incomplete     Studies: Dg Chest 1 View  06/14/2014   CLINICAL DATA:  Fever, history of sickle cell disease  EXAM: CHEST - 1 VIEW  COMPARISON:  03/28/2014  FINDINGS: Borderline cardiomegaly. No acute infiltrate or pleural effusion. No pulmonary edema. Bony thorax is unremarkable.  IMPRESSION: No active disease.   Electronically Signed   By: Lahoma Crocker M.D.   On: 06/14/2014 08:24    Scheduled Meds: . enoxaparin (LOVENOX) injection  40 mg Subcutaneous QHS  . folic acid  1 mg Oral Daily  . HYDROmorphone PCA 0.3 mg/mL   Intravenous 6 times per day  . hydroxyurea  1,000 mg Oral QODAY  . hydroxyurea  500 mg Oral QODAY  . ketorolac  30 mg Intravenous 4 times per day  . oxyCODONE  10 mg Oral QID  . senna-docusate  1 tablet Oral BID  . Vitamin D (Ergocalciferol)  50,000 Units Oral Q7 days   Continuous Infusions: . sodium chloride 100 mL/hr at 06/13/14 1624    Principal Problem:   Sickle cell anemia with crisis Active Problems:   Elevated LFTs   HTN (hypertension)   Transfusion associated hemochromatosis   Vitamin D deficiency   Sickle cell crisis   Macrocytosis    Time spent: 30 minutes    [place] [facility] Pager [phone]. If 7PM-7AM, please contact night-coverage at [email or web address], password [facility] 06/14/2014, 10:35 AM  LOS: 2 days

## 2014-06-14 NOTE — Progress Notes (Signed)
Dr. Shanon Brow paged and notified of patients fever. Gave orders for blood cultures to be drawn along with urinalysis and chest x ray. Orders placed and lab was notified while up on the floor already collected morning labs. William Hamburger RN

## 2014-06-15 ENCOUNTER — Inpatient Hospital Stay (HOSPITAL_COMMUNITY): Payer: BC Managed Care – PPO

## 2014-06-15 DIAGNOSIS — R040 Epistaxis: Secondary | ICD-10-CM

## 2014-06-15 DIAGNOSIS — F411 Generalized anxiety disorder: Secondary | ICD-10-CM | POA: Diagnosis not present

## 2014-06-15 LAB — CBC WITH DIFFERENTIAL/PLATELET
BASOS ABS: 0.1 10*3/uL (ref 0.0–0.1)
Basophils Relative: 1 % (ref 0–1)
EOS ABS: 0.2 10*3/uL (ref 0.0–0.7)
Eosinophils Relative: 2 % (ref 0–5)
HEMATOCRIT: 16.9 % — AB (ref 36.0–46.0)
Hemoglobin: 6.3 g/dL — CL (ref 12.0–15.0)
LYMPHS PCT: 25 % (ref 12–46)
Lymphs Abs: 2.8 10*3/uL (ref 0.7–4.0)
MCH: 40.4 pg — ABNORMAL HIGH (ref 26.0–34.0)
MCHC: 37.3 g/dL — AB (ref 30.0–36.0)
MCV: 108.3 fL — ABNORMAL HIGH (ref 78.0–100.0)
MONOS PCT: 11 % (ref 3–12)
Monocytes Absolute: 1.2 10*3/uL — ABNORMAL HIGH (ref 0.1–1.0)
NEUTROS PCT: 61 % (ref 43–77)
Neutro Abs: 6.7 10*3/uL (ref 1.7–7.7)
PLATELETS: 184 10*3/uL (ref 150–400)
RBC: 1.56 MIL/uL — ABNORMAL LOW (ref 3.87–5.11)
RDW: 23.6 % — AB (ref 11.5–15.5)
WBC MORPHOLOGY: INCREASED
WBC: 11 10*3/uL — AB (ref 4.0–10.5)

## 2014-06-15 LAB — CBC
HCT: 17.1 % — ABNORMAL LOW (ref 36.0–46.0)
Hemoglobin: 6.3 g/dL — CL (ref 12.0–15.0)
MCH: 39.9 pg — ABNORMAL HIGH (ref 26.0–34.0)
MCHC: 36.6 g/dL — ABNORMAL HIGH (ref 30.0–36.0)
MCV: 108.9 fL — ABNORMAL HIGH (ref 78.0–100.0)
PLATELETS: 191 10*3/uL (ref 150–400)
RBC: 1.57 MIL/uL — ABNORMAL LOW (ref 3.87–5.11)
RDW: 21 % — AB (ref 11.5–15.5)
WBC: 9.8 10*3/uL (ref 4.0–10.5)

## 2014-06-15 LAB — PREPARE RBC (CROSSMATCH)

## 2014-06-15 MED ORDER — ALPRAZOLAM 0.25 MG PO TABS
0.2500 mg | ORAL_TABLET | Freq: Two times a day (BID) | ORAL | Status: DC | PRN
  Administered 2014-06-15: 0.25 mg via ORAL
  Filled 2014-06-15: qty 1

## 2014-06-15 MED ORDER — LIDOCAINE VISCOUS 2 % MT SOLN
15.0000 mL | Freq: Once | OROMUCOSAL | Status: AC
  Administered 2014-06-15: 15 mL via OROMUCOSAL
  Filled 2014-06-15: qty 15

## 2014-06-15 MED ORDER — ALPRAZOLAM 0.25 MG PO TABS
0.2500 mg | ORAL_TABLET | Freq: Four times a day (QID) | ORAL | Status: DC | PRN
  Administered 2014-06-15 – 2014-06-24 (×21): 0.25 mg via ORAL
  Filled 2014-06-15 (×21): qty 1

## 2014-06-15 MED ORDER — OXYMETAZOLINE HCL 0.05 % NA SOLN: 1.0000 | Freq: Two times a day (BID) | NASAL | Status: DC

## 2014-06-15 MED ORDER — SODIUM CHLORIDE 0.9 % IV SOLN
Freq: Once | INTRAVENOUS | Status: AC
  Administered 2014-06-15: 21:00:00 via INTRAVENOUS

## 2014-06-15 MED ORDER — LORAZEPAM 2 MG/ML IJ SOLN
0.5000 mg | INTRAMUSCULAR | Status: AC
  Administered 2014-06-15: 0.5 mg via INTRAVENOUS

## 2014-06-15 MED ORDER — LIDOCAINE 5 % EX OINT
TOPICAL_OINTMENT | Freq: Once | CUTANEOUS | Status: DC
  Filled 2014-06-15: qty 35.44

## 2014-06-15 MED ORDER — HYDROMORPHONE HCL 1 MG/ML IJ SOLN
1.0000 mg | INTRAMUSCULAR | Status: AC
  Administered 2014-06-15: 1 mg via INTRAVENOUS

## 2014-06-15 MED ORDER — LIDOCAINE HCL 2 % EX GEL
CUTANEOUS | Status: AC
  Filled 2014-06-15: qty 30

## 2014-06-15 MED ORDER — LORAZEPAM 2 MG/ML IJ SOLN
INTRAMUSCULAR | Status: AC
  Filled 2014-06-15: qty 1

## 2014-06-15 MED ORDER — HYDROMORPHONE HCL 1 MG/ML IJ SOLN
INTRAMUSCULAR | Status: AC
  Filled 2014-06-15: qty 1

## 2014-06-15 MED ORDER — OXYMETAZOLINE HCL 0.05 % NA SOLN
1.0000 | Freq: Two times a day (BID) | NASAL | Status: DC | PRN
  Administered 2014-06-15: 1 via NASAL
  Filled 2014-06-15: qty 15

## 2014-06-15 NOTE — Progress Notes (Addendum)
Dyanne Carrel, NP okayed to turn off O2 sat monitor and CO2 monitor r/t to PCA d/t intermittent nosebleeds and nasal packing. Will frequently monitor patient's VS.  Pt alert and oriented at this time.  In no acute distress.

## 2014-06-15 NOTE — Care Management Note (Unsigned)
    Page 1 of 1   06/15/2014     5:05:04 PM CARE MANAGEMENT NOTE 06/15/2014  Patient:  Kosik,Eryanna L   Account Number:  [id number]  Date Initiated:  06/15/2014  Documentation initiated by:  Vladimir Creeks  Subjective/Objective Assessment:   Admitted with sickle cell crisis. Pt is from home with family, is indepenent, and will return home at D/C     Action/Plan:   No njeeds identified   Anticipated DC Date:  06/18/2014   Anticipated DC Plan:  [place]  CM consult      Choice offered to / List presented to:             Status of service:  In process, will continue to follow Medicare Important Message given?  YES (If response is "NO", the following Medicare IM given date fields will be blank) Date Medicare IM given:  06/14/2014 Medicare IM given by:  Vladimir Creeks Date Additional Medicare IM given:   Additional Medicare IM given by:    Discharge Disposition:    Per UR Regulation:  Reviewed for med. necessity/level of care/duration of stay  If discussed at [place] of Stay Meetings, dates discussed:    Comments:  06/15/14 [place] RN/CM

## 2014-06-15 NOTE — Progress Notes (Signed)
The patient was seen and examined. She was discussed with NP, Ms. Renard Hamper. Agree with her assessment and plan. The patient is currently receiving PCA Dilaudid, scheduled oxycodone 4 times a day, and Toradol IV 4 times a day for pain. PCA Dilaudid was increased where she is now receiving a 2 mg per hour lockout, increased from 1.25 mg. Would consider stopping the PCA and changing her Dilaudid  to IV every 1-2 hours prn, but this may be problematic for nursing. We'll consider discussion with her sickle cell physician, Dr. Zigmund Daniel in the next day or 2 for further recommendations. Small dosing of Xanax was also ordered for anxiousness. This will be used with caution. She has recurrent epistaxis, particularly in her left nostril. With Afrin nasal spray and pressure, it has stopped for now. If it returns, will insert a nasal trumpet. Lovenox was discontinued and SCDs were ordered for DVT prophylaxis. She is being typed and crossed for one unit of packed red blood cell transfusion and her hemoglobin fell to 6.3. IV fluids changed to half normal saline.

## 2014-06-15 NOTE — Progress Notes (Signed)
[facility] PROGRESS NOTE  Dawn [name] YFV:[id number] DOB: 1981/05/30 DOA: 06/12/2014 PCP: [name],[name], MD  Assessment/Plan: 1. Sickle cell anemia with crisis.   Poor pain control even at maximum Dilaudid PCA dose. She reports that this "usually works".  She reports anxiety over fathers health and believes this stressor contributing to poor pain control. Continue IV fluid hydration, hydroxyurea, and PCA Dilaudid with Toradol IV 4 times daily for the next 3 days and oral oxycodone 4 times a day, scheduled. Continue Senokot S. twice a day  and Mirilax to avoid opiate-induced constipation. Hemoglobin continues to trend downward. Will transfuse 1 unit PRBC's. Monitor.    Macrocytosis, likely secondary to sickle cell anemia and/or hydroxyurea.  Continue folic acid.   Nosebleed Likely related to continuous oxygen per [place]. Platelets 191. BP 103/55. She is on Torodol but no hx GI bleed. Will provide afrin nasal spray for vasoconstriction. If no improvement consider nasal packing. Will discontinue lovenox and provide SCD's. Monitor.   Fever:   Afebrile for last 24 hours. Etiology uncertain. Urinalysis unremarkable, chest xray with no acute process, blood cultures no growth to date. She does have dry cough and diminished BS. Levaquin empirically day #2. Tylenol. Monitor closely.   Elevated LFTs.  This is secondary to sickle cell crisis. Stable.  Vitamin D deficiency.  Continue vitamin D supplementation  Anxiety Situational. Patient teary and verbalizes anxiety regarding father's health, recent stroke, recent visit to ED. Anxiety can be trigger for #1 making pain control even more difficult. Will provide low dose anti-anxiety agent. Monitor closely  Code Status: full Family Communication: none present Disposition Plan: home when ready   Consultants:  none  Procedures:  none  Antibiotics:  Levaquin: 06/14/14>>  HPI/Subjective: Sitting on side of bed. Small amount bloody  drainage from nose. Reports little improvement in pain control. Reports very little sleep since admission. Pain mostly in legs and low back.   Objective: Filed Vitals:   06/15/14 0608  BP: 103/55  Pulse: 104  Temp: 97.4 F (36.3 C)  Resp: 20    Intake/Output Summary (Last 24 hours) at 06/15/14 1105 Last data filed at 06/14/14 2157  Gross per 24 hour  Intake 3526.67 ml  Output    300 ml  Net 3226.67 ml   Filed Weights   06/12/14 1841 06/12/14 2223  Weight: 69.854 kg (154 lb) 69.854 kg (154 lb)    Exam:   General:  Sitting on side of bed, appears slightly uncomfortable  Cardiovascular: S1 and S2 no gallup or rub. No LE edema  Respiratory: normal effort but shallow. BS distant but clear to ausculation bilaterally no wheeze  Abdomen: obese, non-distended, +BS , non-tender to touch  Musculoskeletal: joints without swelling/erythema. Mild tenderness lumbar spine to palpation   Data Reviewed: Basic Metabolic Panel:  Recent Labs Lab 06/12/14 2014 06/13/14 0756 06/14/14 0834  NA 138 137 141  K 3.7 3.7 3.8  CL 102 103 107  CO2 26 24 24   GLUCOSE 85 94 101*  BUN 5* 5* 8  CREATININE 0.52 0.50 0.59  CALCIUM 8.6 8.1* 8.0*   Liver Function Tests:  Recent Labs Lab 06/12/14 2014 06/13/14 0756  AST 88* 78*  ALT 39* 36*  ALKPHOS 142* 138*  BILITOT 7.0* 6.6*  PROT 7.3 6.8  ALBUMIN 3.0* 2.7*   No results found for this basename: LIPASE, AMYLASE,  in the last 168 hours No results found for this basename: AMMONIA,  in the last 168 hours CBC:  Recent Labs Lab  06/12/14 2014 06/13/14 0608 06/14/14 0834 06/15/14 0614  WBC 7.5 9.5 11.3* 9.8  NEUTROABS 3.7 3.6  --   --   HGB 8.0* 7.7* 7.4* 6.3*  HCT 21.3* 21.6* 20.4* 17.1*  MCV 107.0* 108.0* 108.5* 108.9*  PLT 187 169 228 191   Cardiac Enzymes: No results found for this basename: CKTOTAL, CKMB, CKMBINDEX, TROPONINI,  in the last 168 hours BNP (last 3 results) No results found for this basename: PROBNP,  in the  last 8760 hours CBG: No results found for this basename: GLUCAP,  in the last 168 hours  Recent Results (from the past 240 hour(s))  CULTURE, BLOOD (ROUTINE X 2)     Status: None   Collection Time    06/14/14  8:45 AM      Result Value Ref Range Status   Specimen Description BLOOD RIGHT HAND   Final   Special Requests     Final   Value: BOTTLES DRAWN AEROBIC AND ANAEROBIC 6CC EACH BOTTLE   Culture NO GROWTH 1 DAY   Final   Report Status PENDING   Incomplete  CULTURE, BLOOD (ROUTINE X 2)     Status: None   Collection Time    06/14/14  8:45 AM      Result Value Ref Range Status   Specimen Description BLOOD RIGHT ARM   Final   Special Requests     Final   Value: BOTTLES DRAWN AEROBIC AND ANAEROBIC 6CC EACH BOTTLE   Culture NO GROWTH 1 DAY   Final   Report Status PENDING   Incomplete     Studies: Dg Chest 1 View  06/14/2014   CLINICAL DATA:  Fever, history of sickle cell disease  EXAM: CHEST - 1 VIEW  COMPARISON:  03/28/2014  FINDINGS: Borderline cardiomegaly. No acute infiltrate or pleural effusion. No pulmonary edema. Bony thorax is unremarkable.  IMPRESSION: No active disease.   Electronically Signed   By: Lahoma Crocker M.D.   On: 06/14/2014 08:24    Scheduled Meds: . sodium chloride   Intravenous Once  . folic acid  1 mg Oral Daily  . HYDROmorphone PCA 0.3 mg/mL   Intravenous 6 times per day  . hydroxyurea  1,000 mg Oral QODAY  . hydroxyurea  500 mg Oral QODAY  . ketorolac  30 mg Intravenous 4 times per day  . levofloxacin  500 mg Oral Daily  . oxyCODONE  10 mg Oral QID  . senna-docusate  1 tablet Oral BID  . Vitamin D (Ergocalciferol)  50,000 Units Oral Q7 days   Continuous Infusions: . sodium chloride 100 mL/hr at 06/14/14 2014    Principal Problem:   Sickle cell anemia with crisis Active Problems:   Elevated LFTs   HTN (hypertension)   Transfusion associated hemochromatosis   Vitamin D deficiency   Sickle cell crisis   Macrocytosis   Anxiety state, unspecified    Nosebleed    Time spent: 35 minutes    [facility] Pager [phone]. If 7PM-7AM, please contact night-coverage at [email or web address], password [facility] 06/15/2014, 11:05 AM  LOS: 3 days

## 2014-06-15 NOTE — Progress Notes (Signed)
Late entry 2211, 06/15/14 pt O2 sats were in the 80's after rhino rocket was placed, paged RT to take a look at the patient. Paged on-call MD made him aware, followed orders given. Will continue to monitor the patient.

## 2014-06-15 NOTE — Evaluation (Addendum)
Pt with worsening epistaxis in setting sickle cell crisis. Patient sent persistent epistaxis since earlier this morning. The initial plan was observation with afrin and  possible placement of rhino rocket. Epistaxis has precipitously worsened over the past one to 2 hours. I discussed overall risk and benefits of right rocket placement with patient at bedside. Patient agreed. Currently on Dilaudid PCA. Has received about 5 mg of the past 11 hours. Patient given one additional milligram of Dilaudid as well as strep 0.5 mg of Ativan for anxiety prior to procedure. Nares inspected with primary bleeding from L nare. Intranasal lidocaine applied. Rhino Rocket placed at bedside with significant improvement in epistaxis. Plan -Check stat CBC. One unit packed red cell transfusion pending. -baseline hgb 8-9 per pt. -Hold NSAIDs -tranfuse 1 unit pRBCs as planned  -continue to follow closely -consider ENT eval at [facility] if epsitaxis precipitously worsens despite rhino rocket placement.

## 2014-06-15 NOTE — Progress Notes (Signed)
Late entry for 2202, lab paged with HgB results, they were critical but they were the same, on-call MD made aware.

## 2014-06-15 NOTE — Progress Notes (Signed)
Upon entering patient's room, pt c/o nosebleed.  Noted several tissues in trashcan with blood on them.  Pt states "I had a nosebleed the last time I was here in the hospital."  Pt reassured that this is more than likely coming from the drying of her mucous membranes brought on by oxygen/nasal cannula. Pt verbalized understanding. Humidity added to patient's 2L [place].  No active bleeding at this time. NP at bedside. Aware of the above. Will continue to monitor.

## 2014-06-15 NOTE — Progress Notes (Signed)
CRITICAL VALUE ALERT  Critical value received:  Hemoglobin 6.3  Date of notification:  9/22  Time of notification:  6659  Critical value read back:Yes.    Nurse who received alert: Tyna Jaksch  MD notified (1st page):  Dr. Caryn Section  Time of first page:  [phone]  MD notified (2nd page):  Time of second page:  Responding MD:  Dr. Caryn Section  Time MD responded:  [phone]

## 2014-06-15 NOTE — Progress Notes (Signed)
Blood bank notified RN that patient's blood showed multiple antibodies, blood may not be available before even tomorrow morning. Dyanne Carrel, NP made aware.  Will continue to monitor.

## 2014-06-15 NOTE — Progress Notes (Signed)
Late entry for 0820, patients nose bleed started again, paged on-call MD to see the patient and consider placement of rhino rocket. Continued to monitor patient and follow orders given.

## 2014-06-16 DIAGNOSIS — R509 Fever, unspecified: Secondary | ICD-10-CM

## 2014-06-16 LAB — CBC WITH DIFFERENTIAL/PLATELET
BASOS ABS: 0.1 10*3/uL (ref 0.0–0.1)
BASOS PCT: 1 % (ref 0–1)
Eosinophils Absolute: 0.2 10*3/uL (ref 0.0–0.7)
Eosinophils Relative: 2 % (ref 0–5)
HCT: 20.6 % — ABNORMAL LOW (ref 36.0–46.0)
HEMOGLOBIN: 7.5 g/dL — AB (ref 12.0–15.0)
Lymphocytes Relative: 25 % (ref 12–46)
Lymphs Abs: 2.1 10*3/uL (ref 0.7–4.0)
MCH: 37.5 pg — AB (ref 26.0–34.0)
MCHC: 36.4 g/dL — AB (ref 30.0–36.0)
MCV: 103 fL — ABNORMAL HIGH (ref 78.0–100.0)
MONO ABS: 1.1 10*3/uL — AB (ref 0.1–1.0)
Monocytes Relative: 14 % — ABNORMAL HIGH (ref 3–12)
Neutro Abs: 4.7 10*3/uL (ref 1.7–7.7)
Neutrophils Relative %: 58 % (ref 43–77)
Platelets: 194 10*3/uL (ref 150–400)
RBC: 2 MIL/uL — ABNORMAL LOW (ref 3.87–5.11)
RDW: 23.1 % — ABNORMAL HIGH (ref 11.5–15.5)
WBC Morphology: INCREASED
WBC: 8.2 10*3/uL (ref 4.0–10.5)

## 2014-06-16 LAB — COMPREHENSIVE METABOLIC PANEL
ALBUMIN: 2.4 g/dL — AB (ref 3.5–5.2)
ALK PHOS: 116 U/L (ref 39–117)
ALT: 32 U/L (ref 0–35)
ANION GAP: 10 (ref 5–15)
AST: 77 U/L — ABNORMAL HIGH (ref 0–37)
BUN: 6 mg/dL (ref 6–23)
CHLORIDE: 106 meq/L (ref 96–112)
CO2: 26 mEq/L (ref 19–32)
CREATININE: 0.62 mg/dL (ref 0.50–1.10)
Calcium: 8 mg/dL — ABNORMAL LOW (ref 8.4–10.5)
GFR calc Af Amer: >90 mL/min (ref >90)
GLUCOSE: 94 mg/dL (ref 70–99)
Potassium: 3.6 mEq/L — ABNORMAL LOW (ref 3.7–5.3)
Sodium: 142 mEq/L (ref 137–147)
Total Bilirubin: 4.8 mg/dL — ABNORMAL HIGH (ref 0.3–1.2)
Total Protein: 6.2 g/dL (ref 6.0–8.3)

## 2014-06-16 LAB — CBC
HCT: 20.8 % — ABNORMAL LOW (ref 36.0–46.0)
HEMOGLOBIN: 7.6 g/dL — AB (ref 12.0–15.0)
MCH: 38 pg — ABNORMAL HIGH (ref 26.0–34.0)
MCHC: 36.5 g/dL — ABNORMAL HIGH (ref 30.0–36.0)
MCV: 104 fL — AB (ref 78.0–100.0)
Platelets: 206 10*3/uL (ref 150–400)
RBC: 2 MIL/uL — ABNORMAL LOW (ref 3.87–5.11)
RDW: 23.4 % — AB (ref 11.5–15.5)
WBC: 7.9 10*3/uL (ref 4.0–10.5)

## 2014-06-16 LAB — PREPARE RBC (CROSSMATCH)

## 2014-06-16 MED ORDER — FUROSEMIDE 10 MG/ML IJ SOLN
10.0000 mg | Freq: Once | INTRAMUSCULAR | Status: AC
  Administered 2014-06-16: 10 mg via INTRAVENOUS
  Filled 2014-06-16: qty 2

## 2014-06-16 MED ORDER — HYDROMORPHONE 0.3 MG/ML IV SOLN
INTRAVENOUS | Status: DC
  Administered 2014-06-16: 13:00:00 via INTRAVENOUS

## 2014-06-16 MED ORDER — HYDROMORPHONE 2 MG/ML HIGH CONCENTRATION IV PCA SOLN: INTRAVENOUS | Status: DC

## 2014-06-16 MED ORDER — HYDROMORPHONE 0.3 MG/ML IV SOLN
INTRAVENOUS | Status: DC
Start: 2014-06-16 — End: 2014-06-17
  Administered 2014-06-16 – 2014-06-17 (×4): via INTRAVENOUS
  Administered 2014-06-17: 6.74 mg via INTRAVENOUS
  Filled 2014-06-16 (×5): qty 25

## 2014-06-16 MED ORDER — ONDANSETRON HCL 4 MG/2ML IJ SOLN
4.0000 mg | Freq: Four times a day (QID) | INTRAMUSCULAR | Status: DC | PRN
  Administered 2014-06-23 – 2014-06-24 (×4): 4 mg via INTRAVENOUS
  Filled 2014-06-16 (×4): qty 2

## 2014-06-16 MED ORDER — LIDOCAINE 5 % EX OINT
TOPICAL_OINTMENT | Freq: Once | CUTANEOUS | Status: DC
  Filled 2014-06-16: qty 35.44

## 2014-06-16 NOTE — Progress Notes (Signed)
Patient seen and examined. Note reviewed  This patient was admitted to the hospital with sickle cell crisis. She is requiring Dilaudid by PCA. She's been adequately hydrated with IV fluids. She did require transfusion of one unit of PRBC yesterday. We'll continue to follow serial hemoglobins. She reports that her pain continues to be uncontrolled and is currently a 7/10. Despite being in significant pain, she does appear somewhat somnolent. We'll continue current treatments. She is having significant epistaxis that required a Rhino Rocket placed. This appears to have resolved her epistaxis. Once [employer] was removed, she did have recurrent amount of dripping epistaxis. Her left nare will be packed with gauze.  MEMON,JEHANZEB

## 2014-06-16 NOTE — Progress Notes (Signed)
Unable to record a CO2 level per policy with PCA because pt has a rino rocket in left nare pt is now alert  Sitting on bed eating breakfast O2 via venturi mask at 55%

## 2014-06-16 NOTE — Progress Notes (Signed)
[facility] PROGRESS NOTE  Dawn [name] OFB:[id number] DOB: 06-09-1981 DOA: 06/12/2014 PCP: [name],[name], MD  Assessment/Plan: 1. Sickle cell anemia with crisis.  Poor pain control even at maximum Dilaudid PCA dose. Of note patient hospitalized 11/14 for same. Chart review indicates special dosing at that time with continuous night administration. Unfortunately current PCA pump will not accept this dosing. Will adjust to max it will accept and start continuous at night. Will decrease rate of IV fluid and continue oral oxycodone 4 times a day, scheduled. Continue Senokot S. twice a day and Mirilax to avoid opiate-induced constipation. Hemoglobin improved to 7.6 s/p 1 unit PRBC's.   Macrocytosis, likely secondary to sickle cell anemia and/or hydroxyurea.  Continue folic acid.  Nosebleed  Likely related to continuous oxygen per [place]. Platelets 191. BP 103/55. S/p rhino rocket last evening due to worsening epistaxis. Rhino rocket saturated. Removed without any bleeding. Will monitor, use afrin nasal spray for vasoconstriction to try and avoid replacing rhino rocket per patient request. Monitor closely. If rebleeds will insert rhino rocket.  Will discontinue lovenox and provide SCD's. Monitor.   Fever:  Afebrile for last 24 hours. Etiology uncertain. Urinalysis unremarkable, chest xray with no acute process, blood cultures no growth to date. She does have dry cough and diminished BS. Levaquin empirically day #3. Tylenol. Monitor closely.   Elevated LFTs.  This is secondary to sickle cell crisis. Stable.   Vitamin D deficiency.  Continue vitamin D supplementation   Anxiety  Worsening anxiety with epistaxis and poor pain control. Improved with xanax QID.     Code Status: full Family Communication:  None present(indicate person spoken with, relationship, and if by phone, the number) Disposition Plan: home when ready  sultants:  none  Procedures:  Rhino  rocket  Antibiotics:  levaquin 06/14/14>>  HPI/Subjective: Sitting on side of bed. Appears comfortable and actually somewhat drowsy.  Objective: Filed Vitals:   06/16/14 1332  BP:   Pulse:   Temp:   Resp: 20    Intake/Output Summary (Last 24 hours) at 06/16/14 1522 Last data filed at 06/16/14 0524  Gross per 24 hour  Intake 3839.17 ml  Output    450 ml  Net 3389.17 ml   Filed Weights   06/12/14 1841 06/12/14 2223  Weight: 69.854 kg (154 lb) 69.854 kg (154 lb)    Exam:   General:  Obese, no acute distress  Cardiovascular: RRR no m/g/r no LE edema  Respiratory: normal effort somewhat shallow no wheeze fair air flow  Abdomen: obese soft +BS non-tender to palpation  Musculoskeletal: no clubbing or cyanosis   Data Reviewed: Basic Metabolic Panel:  Recent Labs Lab 06/12/14 2014 06/13/14 0756 06/14/14 0834 06/16/14 0611  NA 138 137 141 142  K 3.7 3.7 3.8 3.6*  CL 102 103 107 106  CO2 26 24 24 26   GLUCOSE 85 94 101* 94  BUN 5* 5* 8 6  CREATININE 0.52 0.50 0.59 0.62  CALCIUM 8.6 8.1* 8.0* 8.0*   Liver Function Tests:  Recent Labs Lab 06/12/14 2014 06/13/14 0756 06/16/14 0611  AST 88* 78* 77*  ALT 39* 36* 32  ALKPHOS 142* 138* 116  BILITOT 7.0* 6.6* 4.8*  PROT 7.3 6.8 6.2  ALBUMIN 3.0* 2.7* 2.4*   No results found for this basename: LIPASE, AMYLASE,  in the last 168 hours No results found for this basename: AMMONIA,  in the last 168 hours CBC:  Recent Labs Lab 06/12/14 2014 06/13/14 0608 06/14/14 0834 06/15/14 7824 06/15/14  2113 06/16/14 0207 06/16/14 0611  WBC 7.5 9.5 11.3* 9.8 11.0* 8.2 7.9  NEUTROABS 3.7 3.6  --   --  6.7 4.7  --   HGB 8.0* 7.7* 7.4* 6.3* 6.3* 7.5* 7.6*  HCT 21.3* 21.6* 20.4* 17.1* 16.9* 20.6* 20.8*  MCV 107.0* 108.0* 108.5* 108.9* 108.3* 103.0* 104.0*  PLT 187 169 228 191 184 194 206   Cardiac Enzymes: No results found for this basename: CKTOTAL, CKMB, CKMBINDEX, TROPONINI,  in the last 168 hours BNP (last 3  results) No results found for this basename: PROBNP,  in the last 8760 hours CBG: No results found for this basename: GLUCAP,  in the last 168 hours  Recent Results (from the past 240 hour(s))  CULTURE, BLOOD (ROUTINE X 2)     Status: None   Collection Time    06/14/14  8:45 AM      Result Value Ref Range Status   Specimen Description BLOOD RIGHT HAND   Final   Special Requests     Final   Value: BOTTLES DRAWN AEROBIC AND ANAEROBIC 6CC EACH BOTTLE   Culture NO GROWTH 2 DAYS   Final   Report Status PENDING   Incomplete  CULTURE, BLOOD (ROUTINE X 2)     Status: None   Collection Time    06/14/14  8:45 AM      Result Value Ref Range Status   Specimen Description BLOOD RIGHT ARM   Final   Special Requests     Final   Value: BOTTLES DRAWN AEROBIC AND ANAEROBIC 6CC EACH BOTTLE   Culture NO GROWTH 2 DAYS   Final   Report Status PENDING   Incomplete     Studies: Dg Chest Port 1 View  06/16/2014   CLINICAL DATA:  Shortness of breath. Rule out pneumonia. History of sickle cell disease.  EXAM: PORTABLE CHEST - 1 VIEW  COMPARISON:  06/14/2014  FINDINGS: There is chronic mild cardiomegaly. Prominence of the mid right mediastinal contour is likely projectional based on previous exam. Diffuse interstitial prominence which is above baseline. Blood flow is cephalized. No asymmetric opacity or effusion suggestive of pneumonia.  IMPRESSION: Cardiomegaly and mild pulmonary edema.  No focal pneumonia.   Electronically Signed   By: Jorje Guild M.D.   On: 06/16/2014 00:13    Scheduled Meds: . folic acid  1 mg Oral Daily  . HYDROmorphone PCA 0.3 mg/mL   Intravenous 6 times per day  . hydroxyurea  1,000 mg Oral QODAY  . hydroxyurea  500 mg Oral QODAY  . levofloxacin  500 mg Oral Daily  . lidocaine   Topical Once  . lidocaine   Topical Once  . [COMPLETED] LORazepam      . oxyCODONE  10 mg Oral QID  . senna-docusate  1 tablet Oral BID  . Vitamin D (Ergocalciferol)  50,000 Units Oral Q7 days    Continuous Infusions:   Principal Problem:   Sickle cell anemia with crisis Active Problems:   Elevated LFTs   HTN (hypertension)   Transfusion associated hemochromatosis   Vitamin D deficiency   Sickle cell crisis   Macrocytosis   Anxiety state, unspecified   Nosebleed    Time spent: 40 minutes    [facility] Pager [phone]. If 7PM-7AM, please contact night-coverage at [email or web address], password [facility] 06/16/2014, 3:22 PM  LOS: 4 days

## 2014-06-17 DIAGNOSIS — E876 Hypokalemia: Secondary | ICD-10-CM

## 2014-06-17 LAB — BASIC METABOLIC PANEL
ANION GAP: 8 (ref 5–15)
BUN: 3 mg/dL — AB (ref 6–23)
CHLORIDE: 105 meq/L (ref 96–112)
CO2: 28 mEq/L (ref 19–32)
Calcium: 8 mg/dL — ABNORMAL LOW (ref 8.4–10.5)
Creatinine, Ser: 0.47 mg/dL — ABNORMAL LOW (ref 0.50–1.10)
GFR calc non Af Amer: >90 mL/min (ref >90)
Glucose, Bld: 89 mg/dL (ref 70–99)
POTASSIUM: 3.6 meq/L — AB (ref 3.7–5.3)
SODIUM: 141 meq/L (ref 137–147)

## 2014-06-17 LAB — CBC
HEMATOCRIT: 20.2 % — AB (ref 36.0–46.0)
Hemoglobin: 7.3 g/dL — ABNORMAL LOW (ref 12.0–15.0)
MCH: 37.6 pg — ABNORMAL HIGH (ref 26.0–34.0)
MCHC: 36.1 g/dL — ABNORMAL HIGH (ref 30.0–36.0)
MCV: 104.1 fL — ABNORMAL HIGH (ref 78.0–100.0)
PLATELETS: 185 10*3/uL (ref 150–400)
RBC: 1.94 MIL/uL — ABNORMAL LOW (ref 3.87–5.11)
RDW: 23.7 % — AB (ref 11.5–15.5)
WBC: 6.5 10*3/uL (ref 4.0–10.5)

## 2014-06-17 MED ORDER — HYDROMORPHONE 0.3 MG/ML IV SOLN: INTRAVENOUS | Status: DC

## 2014-06-17 MED ORDER — HYDROMORPHONE 0.3 MG/ML IV SOLN
INTRAVENOUS | Status: DC
  Administered 2014-06-17 – 2014-06-18 (×3): via INTRAVENOUS
  Administered 2014-06-18: 0.3 mL via INTRAVENOUS
  Administered 2014-06-18: 10.99 mL via INTRAVENOUS
  Administered 2014-06-18: 19:00:00 via INTRAVENOUS
  Administered 2014-06-18: 4.58 mg via INTRAVENOUS
  Administered 2014-06-19: 0.3 mg via INTRAVENOUS
  Administered 2014-06-19: 5.05 mg via INTRAVENOUS
  Administered 2014-06-19: 1.41 mg via INTRAVENOUS
  Administered 2014-06-19: 0.5 mg via INTRAVENOUS
  Administered 2014-06-19: 04:00:00 via INTRAVENOUS
  Administered 2014-06-19: 5.1 mg via INTRAVENOUS
  Administered 2014-06-19: 1.75 mg via INTRAVENOUS
  Administered 2014-06-20: 0.9 mg via INTRAVENOUS
  Administered 2014-06-20: 2.87 mg via INTRAVENOUS
  Administered 2014-06-20: 3.6 mg via INTRAVENOUS
  Administered 2014-06-20: 1.2 mg via INTRAVENOUS
  Administered 2014-06-20: 0.3 mg via INTRAVENOUS
  Administered 2014-06-21: 2.11 mg via INTRAVENOUS
  Administered 2014-06-21: 0.7 mg via INTRAVENOUS
  Administered 2014-06-21: 1.97 mg via INTRAVENOUS
  Administered 2014-06-21: 2.79 mg via INTRAVENOUS
  Filled 2014-06-17 (×10): qty 25

## 2014-06-17 MED ORDER — HYDROMORPHONE HCL 1 MG/ML IJ SOLN
2.0000 mg | INTRAMUSCULAR | Status: DC | PRN
  Administered 2014-06-17 – 2014-06-20 (×23): 2 mg via INTRAVENOUS
  Filled 2014-06-17 (×24): qty 2

## 2014-06-17 MED ORDER — HYDROMORPHONE HCL 1 MG/ML IJ SOLN: 2.0000 mg | INTRAMUSCULAR | Status: DC | PRN

## 2014-06-17 NOTE — Progress Notes (Signed)
Patient seen and examined. Note reviewed  Patient continues to complain of back pain related to her sickle cell disease. Case was discussed at length with Dr. Zigmund Daniel who is patient's primary sickle cell physician. Dr. Zigmund Daniel made recommendations regarding management of PCA Dilaudid as well as further pain management based on patient's medicine requirements that she receives at the sickle cell day center. Medications were adjusted. Hopefully this should help the patient's pain. She's not had any further epistaxis. We'll continue to monitor.  Ruben Mahler

## 2014-06-17 NOTE — Progress Notes (Signed)
[facility] PROGRESS NOTE  Jacquelyn Shadrick Fife OMV:[id number] DOB: 07-Nov-1980 DOA: 06/12/2014 PCP: [name],[name], MD  Assessment/Plan: 1. Sickle cell anemia with crisis.  Minimal improvement in  pain control. Of note patient hospitalized 11/14 for same. Chart review indicates special dosing at that time with continuous night administration. Unfortunately current PCA pump will not accept this dosing. She has been getting the max that pump will deliver and still rating pain 7/10. She did sleep better with continuous pain medication  at night. Continue  oxycodone 4 times a day, scheduled. She has requested hourly bolus'.  Continue Senokot S. twice a day and Mirilax to avoid opiate-induced constipation. Hemoglobin stable s/p 1 unit PRBC's.   Macrocytosis, likely secondary to sickle cell anemia and/or hydroxyurea.  Continue folic acid.   Nosebleed   No further bleeding since rhino rocket removed last evening. Platelets 185. BP 107/72. Contnue afrin nasal spray for vasoconstriction to try and avoid replacing rhino rocket per patient request. Monitor closely. No Lovenox and provide SCD's. Monitor.  Fever:  Afebrile for last 48 hours.  Urinalysis unremarkable, chest xray with no acute process, blood cultures no growth to date. She does have dry cough and diminished BS. Levaquin empirically day #4. Tylenol. Monitor closely.   Elevated LFTs.  This is secondary to sickle cell crisis. Stable.   Vitamin D deficiency.  Continue vitamin D supplementation  Anxiety  Improved with xanax QID.   Code Status: full Family Communication: none present Disposition Plan: home when ready   Consultants:  none  Procedures:  none  Antibiotics:  levaquin 06/14/14>>  HPI/Subjective: Awake. Reports resting better last night but hurting "even worse today"  Objective: Filed Vitals:   06/17/14 0718  BP:   Pulse:   Temp:   Resp: 18    Intake/Output Summary (Last 24 hours) at 06/17/14 1316 Last  data filed at 06/16/14 1712  Gross per 24 hour  Intake    240 ml  Output      0 ml  Net    240 ml   Filed Weights   06/12/14 1841 06/12/14 2223  Weight: 69.854 kg (154 lb) 69.854 kg (154 lb)    Exam:   General:  Appears somewhat uncomfortable  Cardiovascular: RRR No MGR No LE edema  Respiratory: normal effort BS clear bilaterally no wheeze  Abdomen: obese non-distended + BS non-tender  Musculoskeletal: joints without swelling/erythema   Data Reviewed: Basic Metabolic Panel:  Recent Labs Lab 06/12/14 2014 06/13/14 0756 06/14/14 0834 06/16/14 0611 06/17/14 0649  NA 138 137 141 142 141  K 3.7 3.7 3.8 3.6* 3.6*  CL 102 103 107 106 105  CO2 26 24 24 26 28   GLUCOSE 85 94 101* 94 89  BUN 5* 5* 8 6 3*  CREATININE 0.52 0.50 0.59 0.62 0.47*  CALCIUM 8.6 8.1* 8.0* 8.0* 8.0*   Liver Function Tests:  Recent Labs Lab 06/12/14 2014 06/13/14 0756 06/16/14 0611  AST 88* 78* 77*  ALT 39* 36* 32  ALKPHOS 142* 138* 116  BILITOT 7.0* 6.6* 4.8*  PROT 7.3 6.8 6.2  ALBUMIN 3.0* 2.7* 2.4*   No results found for this basename: LIPASE, AMYLASE,  in the last 168 hours No results found for this basename: AMMONIA,  in the last 168 hours CBC:  Recent Labs Lab 06/12/14 2014 06/13/14 0608  06/15/14 0614 06/15/14 2113 06/16/14 0207 06/16/14 0611 06/17/14 0649  WBC 7.5 9.5  < > 9.8 11.0* 8.2 7.9 6.5  NEUTROABS 3.7 3.6  --   --  6.7 4.7  --   --   HGB 8.0* 7.7*  < > 6.3* 6.3* 7.5* 7.6* 7.3*  HCT 21.3* 21.6*  < > 17.1* 16.9* 20.6* 20.8* 20.2*  MCV 107.0* 108.0*  < > 108.9* 108.3* 103.0* 104.0* 104.1*  PLT 187 169  < > 191 184 194 206 185  < > = values in this interval not displayed. Cardiac Enzymes: No results found for this basename: CKTOTAL, CKMB, CKMBINDEX, TROPONINI,  in the last 168 hours BNP (last 3 results) No results found for this basename: PROBNP,  in the last 8760 hours CBG: No results found for this basename: GLUCAP,  in the last 168 hours  Recent Results  (from the past 240 hour(s))  CULTURE, BLOOD (ROUTINE X 2)     Status: None   Collection Time    06/14/14  8:45 AM      Result Value Ref Range Status   Specimen Description BLOOD RIGHT HAND   Final   Special Requests     Final   Value: BOTTLES DRAWN AEROBIC AND ANAEROBIC 6CC EACH BOTTLE   Culture NO GROWTH 3 DAYS   Final   Report Status PENDING   Incomplete  CULTURE, BLOOD (ROUTINE X 2)     Status: None   Collection Time    06/14/14  8:45 AM      Result Value Ref Range Status   Specimen Description BLOOD RIGHT ARM   Final   Special Requests     Final   Value: BOTTLES DRAWN AEROBIC AND ANAEROBIC 6CC EACH BOTTLE   Culture NO GROWTH 3 DAYS   Final   Report Status PENDING   Incomplete     Studies: Dg Chest Port 1 View  06/16/2014   CLINICAL DATA:  Shortness of breath. Rule out pneumonia. History of sickle cell disease.  EXAM: PORTABLE CHEST - 1 VIEW  COMPARISON:  06/14/2014  FINDINGS: There is chronic mild cardiomegaly. Prominence of the mid right mediastinal contour is likely projectional based on previous exam. Diffuse interstitial prominence which is above baseline. Blood flow is cephalized. No asymmetric opacity or effusion suggestive of pneumonia.  IMPRESSION: Cardiomegaly and mild pulmonary edema.  No focal pneumonia.   Electronically Signed   By: Jorje Guild M.D.   On: 06/16/2014 00:13    Scheduled Meds: . folic acid  1 mg Oral Daily  . HYDROmorphone PCA 0.3 mg/mL   Intravenous 6 times per day  . hydroxyurea  1,000 mg Oral QODAY  . hydroxyurea  500 mg Oral QODAY  . levofloxacin  500 mg Oral Daily  . lidocaine   Topical Once  . lidocaine   Topical Once  . oxyCODONE  10 mg Oral QID  . senna-docusate  1 tablet Oral BID  . Vitamin D (Ergocalciferol)  50,000 Units Oral Q7 days   Continuous Infusions:   Principal Problem:   Sickle cell anemia with crisis Active Problems:   Elevated LFTs   HTN (hypertension)   Transfusion associated hemochromatosis   Vitamin D  deficiency   Sickle cell crisis   Macrocytosis   Anxiety state, unspecified   Nosebleed    Time spent: 35 minutes    [facility] Pager [phone]. If 7PM-7AM, please contact night-coverage at [email or web address], password [facility] 06/17/2014, 1:16 PM  LOS: 5 days

## 2014-06-18 DIAGNOSIS — D571 Sickle-cell disease without crisis: Secondary | ICD-10-CM

## 2014-06-18 LAB — BASIC METABOLIC PANEL
ANION GAP: 10 (ref 5–15)
BUN: 4 mg/dL — ABNORMAL LOW (ref 6–23)
CHLORIDE: 105 meq/L (ref 96–112)
CO2: 26 mEq/L (ref 19–32)
Calcium: 8.5 mg/dL (ref 8.4–10.5)
Creatinine, Ser: 0.51 mg/dL (ref 0.50–1.10)
GFR calc Af Amer: >90 mL/min (ref >90)
GFR calc non Af Amer: >90 mL/min (ref >90)
GLUCOSE: 86 mg/dL (ref 70–99)
POTASSIUM: 4 meq/L (ref 3.7–5.3)
Sodium: 141 mEq/L (ref 137–147)

## 2014-06-18 LAB — CBC
HCT: 22.9 % — ABNORMAL LOW (ref 36.0–46.0)
HEMOGLOBIN: 8.3 g/dL — AB (ref 12.0–15.0)
MCH: 37.7 pg — ABNORMAL HIGH (ref 26.0–34.0)
MCHC: 36.2 g/dL — ABNORMAL HIGH (ref 30.0–36.0)
MCV: 104.1 fL — ABNORMAL HIGH (ref 78.0–100.0)
Platelets: 190 10*3/uL (ref 150–400)
RBC: 2.2 MIL/uL — AB (ref 3.87–5.11)
RDW: 23.3 % — ABNORMAL HIGH (ref 11.5–15.5)
WBC: 6.9 10*3/uL (ref 4.0–10.5)

## 2014-06-18 NOTE — Progress Notes (Signed)
[facility] PROGRESS NOTE  Dawn [name] LJQ:[id number] DOB: Apr 29, 1981 DOA: 06/12/2014 PCP: [name],[name], MD  Assessment/Plan: 1. Sickle cell anemia with crisis.  Some improvement today. Pain rated 4-6 vs 6-8 before Dilaudid bolus' started yesterday. Continue  PCA pump at current settings with Dilaudid bolus's every 2 hours and continuous infusion at night. Continue Senokot S. twice a day and Mirilax to avoid opiate-induced constipation. Hemoglobin stable s/p 1 unit PRBC's.  Macrocytosis, likely secondary to sickle cell anemia and/or hydroxyurea.  Continue folic acid.  Nosebleed  Small amount bleeding from nose this am. It quickly resolved.  Platelets 190. BP  Contnue afrin nasal spray as needed. Monitor closely. No Lovenox and provide SCD's. Monitor.   Fever:  Afebrile for last 72 hours. Urinalysis unremarkable, chest xray with no acute process, blood cultures no growth to date. She does have dry cough and diminished BS. Levaquin empirically day #5. Tylenol. Monitor closely.  Elevated LFTs.  This is secondary to sickle cell crisis. Stable.  Vitamin D deficiency.  Continue vitamin D supplementation  Anxiety  Improved with xanax QID.    Code Status: full Family Communication: none present Disposition Plan: home when ready   Consultants:  none  Procedures:  none  Antibiotics:  Levaquin 06/14/14>>>  HPI/Subjective: Awake watching TV. Rubbing thighs. States "im a little better"  Objective: Filed Vitals:   06/18/14 1200  BP:   Pulse:   Temp:   Resp: 16    Intake/Output Summary (Last 24 hours) at 06/18/14 1347 Last data filed at 06/18/14 0800  Gross per 24 hour  Intake    480 ml  Output      0 ml  Net    480 ml   Filed Weights   06/12/14 1841 06/12/14 2223  Weight: 69.854 kg (154 lb) 69.854 kg (154 lb)    Exam:   General:  Well nourished facial grimacing with reposoitoning  Cardiovascular: tachycardic but regular no m/g/r no LE  edema  Respiratory: normal effort BS with good air flow. No wheeze or rhonchi  Abdomen: obese soft non-tender to palpation +BS  Musculoskeletal: no clubbing or cyanosis   Data Reviewed: Basic Metabolic Panel:  Recent Labs Lab 06/13/14 0756 06/14/14 0834 06/16/14 0611 06/17/14 0649 06/18/14 0635  NA 137 141 142 141 141  K 3.7 3.8 3.6* 3.6* 4.0  CL 103 107 106 105 105  CO2 24 24 26 28 26   GLUCOSE 94 101* 94 89 86  BUN 5* 8 6 3* 4*  CREATININE 0.50 0.59 0.62 0.47* 0.51  CALCIUM 8.1* 8.0* 8.0* 8.0* 8.5   Liver Function Tests:  Recent Labs Lab 06/12/14 2014 06/13/14 0756 06/16/14 0611  AST 88* 78* 77*  ALT 39* 36* 32  ALKPHOS 142* 138* 116  BILITOT 7.0* 6.6* 4.8*  PROT 7.3 6.8 6.2  ALBUMIN 3.0* 2.7* 2.4*   No results found for this basename: LIPASE, AMYLASE,  in the last 168 hours No results found for this basename: AMMONIA,  in the last 168 hours CBC:  Recent Labs Lab 06/12/14 2014 06/13/14 0608  06/15/14 2113 06/16/14 0207 06/16/14 0611 06/17/14 0649 06/18/14 0635  WBC 7.5 9.5  < > 11.0* 8.2 7.9 6.5 6.9  NEUTROABS 3.7 3.6  --  6.7 4.7  --   --   --   HGB 8.0* 7.7*  < > 6.3* 7.5* 7.6* 7.3* 8.3*  HCT 21.3* 21.6*  < > 16.9* 20.6* 20.8* 20.2* 22.9*  MCV 107.0* 108.0*  < > 108.3* 103.0* 104.0*  104.1* 104.1*  PLT 187 169  < > 184 194 206 185 190  < > = values in this interval not displayed. Cardiac Enzymes: No results found for this basename: CKTOTAL, CKMB, CKMBINDEX, TROPONINI,  in the last 168 hours BNP (last 3 results) No results found for this basename: PROBNP,  in the last 8760 hours CBG: No results found for this basename: GLUCAP,  in the last 168 hours  Recent Results (from the past 240 hour(s))  CULTURE, BLOOD (ROUTINE X 2)     Status: None   Collection Time    06/14/14  8:45 AM      Result Value Ref Range Status   Specimen Description BLOOD RIGHT HAND   Final   Special Requests     Final   Value: BOTTLES DRAWN AEROBIC AND ANAEROBIC 6CC EACH  BOTTLE   Culture NO GROWTH 4 DAYS   Final   Report Status PENDING   Incomplete  CULTURE, BLOOD (ROUTINE X 2)     Status: None   Collection Time    06/14/14  8:45 AM      Result Value Ref Range Status   Specimen Description BLOOD RIGHT ARM   Final   Special Requests     Final   Value: BOTTLES DRAWN AEROBIC AND ANAEROBIC 6CC EACH BOTTLE   Culture NO GROWTH 4 DAYS   Final   Report Status PENDING   Incomplete     Studies: No results found.  Scheduled Meds: . folic acid  1 mg Oral Daily  . HYDROmorphone PCA 0.3 mg/mL   Intravenous 6 times per day  . hydroxyurea  1,000 mg Oral QODAY  . hydroxyurea  500 mg Oral QODAY  . levofloxacin  500 mg Oral Daily  . lidocaine   Topical Once  . lidocaine   Topical Once  . senna-docusate  1 tablet Oral BID  . Vitamin D (Ergocalciferol)  50,000 Units Oral Q7 days   Continuous Infusions:   Principal Problem:   Sickle cell anemia with crisis Active Problems:   Elevated LFTs   HTN (hypertension)   Transfusion associated hemochromatosis   Vitamin D deficiency   Sickle cell crisis   Macrocytosis   Anxiety state, unspecified   Nosebleed    Time spent: 35 minutes    [facility] Pager [phone]. If 7PM-7AM, please contact night-coverage at [email or web address], password [facility] 06/18/2014, 1:47 PM  LOS: 6 days

## 2014-06-18 NOTE — Progress Notes (Signed)
Patient has had one IV leaking and one IV infiltrate with normal saline today. PACU nurse has to start IV's and in room at this time. Patient very difficult stick. Discussed with Dr. Roderic Palau. Called [facility] for picc line placement today, but they are unable to fit her in. Awaiting to see if PACU nurse is abel to establish site.

## 2014-06-18 NOTE — Progress Notes (Signed)
Patient lying in bed resting quietly with eyes closed at this time. Surgery contacted due to patient needing new IV. They will come see patient.

## 2014-06-18 NOTE — Progress Notes (Signed)
Patient seen and examined. Note reviewed  Patient's PCA was adjusted yesterday based on recommendations from her sickle cell M.D. she has with her IV access today and went quite well without any pain medications. She'll continue on current regimen we will de-escalate as her pain improves.  Dawn [name]

## 2014-06-19 DIAGNOSIS — F411 Generalized anxiety disorder: Secondary | ICD-10-CM

## 2014-06-19 LAB — CULTURE, BLOOD (ROUTINE X 2)
CULTURE: NO GROWTH
CULTURE: NO GROWTH

## 2014-06-19 LAB — TYPE AND SCREEN
ABO/RH(D): A POS
ANTIBODY SCREEN: POSITIVE
DAT, IgG: NEGATIVE
UNIT DIVISION: 0
Unit division: 0
Unit division: 0

## 2014-06-19 LAB — CBC
HEMATOCRIT: 22.2 % — AB (ref 36.0–46.0)
Hemoglobin: 7.8 g/dL — ABNORMAL LOW (ref 12.0–15.0)
MCH: 36.6 pg — ABNORMAL HIGH (ref 26.0–34.0)
MCHC: 35.1 g/dL (ref 30.0–36.0)
MCV: 104.2 fL — AB (ref 78.0–100.0)
PLATELETS: 149 10*3/uL — AB (ref 150–400)
RBC: 2.13 MIL/uL — AB (ref 3.87–5.11)
RDW: 22.8 % — ABNORMAL HIGH (ref 11.5–15.5)
WBC: 9 10*3/uL (ref 4.0–10.5)

## 2014-06-19 NOTE — Progress Notes (Signed)
[facility] PROGRESS NOTE  Dawn [name]:[id number] DOB: 1981-07-06 DOA: 06/12/2014 PCP: [name],[name], MD  Assessment/Plan: 1. Sickle cell anemia with crisis.  Feels that she is improving with current pain regimen. Continue  PCA pump at current settings with Dilaudid bolus's every 2 hours and continuous infusion at night. Continue Senokot S. twice a day and Mirilax to avoid opiate-induced constipation. Hemoglobin stable s/p 1 unit PRBC's. If she continues to improve, will de-escalate pain medications tomorrow. Macrocytosis, likely secondary to sickle cell anemia and/or hydroxyurea.  Continue folic acid.  Nosebleed  Small amount bleeding from nose this am. It quickly resolved.  Platelets 190. BP  Contnue afrin nasal spray as needed. Monitor closely. No Lovenox and provide SCD's. Monitor.   Fever:  Afebrile for last 72 hours. Urinalysis unremarkable, chest xray with no acute process, blood cultures no growth to date. She does have dry cough and diminished BS. She has completed a course of levaquin.  She is otherwise stable.  Elevated LFTs.  This is secondary to sickle cell crisis. Stable.  Vitamin D deficiency.  Continue vitamin D supplementation  Anxiety  Improved with xanax QID.    Code Status: full Family Communication: none present Disposition Plan: home when ready   Consultants:  none  Procedures:  none  Antibiotics:  Levaquin 06/14/14>>>  HPI/Subjective: Feeling better, pain improving.  Objective: Filed Vitals:   06/19/14 1500  BP: 113/65  Pulse: 95  Temp: 98.4 F (36.9 C)  Resp: 16    Intake/Output Summary (Last 24 hours) at 06/19/14 1838 Last data filed at 06/19/14 1800  Gross per 24 hour  Intake    720 ml  Output      0 ml  Net    720 ml   Filed Weights   06/12/14 1841 06/12/14 2223  Weight: 69.854 kg (154 lb) 69.854 kg (154 lb)    Exam:   General:  Well nourished, no distress  Cardiovascular: RRR no m/g/r no LE  edema  Respiratory: normal effort BS with good air flow. No wheeze or rhonchi  Abdomen: obese soft non-tender to palpation +BS  Musculoskeletal: no clubbing or cyanosis   Data Reviewed: Basic Metabolic Panel:  Recent Labs Lab 06/13/14 0756 06/14/14 0834 06/16/14 0611 06/17/14 0649 06/18/14 0635  NA 137 141 142 141 141  K 3.7 3.8 3.6* 3.6* 4.0  CL 103 107 106 105 105  CO2 24 24 26 28 26   GLUCOSE 94 101* 94 89 86  BUN 5* 8 6 3* 4*  CREATININE 0.50 0.59 0.62 0.47* 0.51  CALCIUM 8.1* 8.0* 8.0* 8.0* 8.5   Liver Function Tests:  Recent Labs Lab 06/12/14 2014 06/13/14 0756 06/16/14 0611  AST 88* 78* 77*  ALT 39* 36* 32  ALKPHOS 142* 138* 116  BILITOT 7.0* 6.6* 4.8*  PROT 7.3 6.8 6.2  ALBUMIN 3.0* 2.7* 2.4*   No results found for this basename: LIPASE, AMYLASE,  in the last 168 hours No results found for this basename: AMMONIA,  in the last 168 hours CBC:  Recent Labs Lab 06/12/14 2014 06/13/14 0608  06/15/14 2113 06/16/14 0207 06/16/14 0611 06/17/14 0649 06/18/14 0635 06/19/14 0734  WBC 7.5 9.5  < > 11.0* 8.2 7.9 6.5 6.9 9.0  NEUTROABS 3.7 3.6  --  6.7 4.7  --   --   --   --   HGB 8.0* 7.7*  < > 6.3* 7.5* 7.6* 7.3* 8.3* 7.8*  HCT 21.3* 21.6*  < > 16.9* 20.6* 20.8* 20.2* 22.9*  22.2*  MCV 107.0* 108.0*  < > 108.3* 103.0* 104.0* 104.1* 104.1* 104.2*  PLT 187 169  < > 184 194 206 185 190 149*  < > = values in this interval not displayed. Cardiac Enzymes: No results found for this basename: CKTOTAL, CKMB, CKMBINDEX, TROPONINI,  in the last 168 hours BNP (last 3 results) No results found for this basename: PROBNP,  in the last 8760 hours CBG: No results found for this basename: GLUCAP,  in the last 168 hours  Recent Results (from the past 240 hour(s))  CULTURE, BLOOD (ROUTINE X 2)     Status: None   Collection Time    06/14/14  8:45 AM      Result Value Ref Range Status   Specimen Description BLOOD RIGHT HAND   Final   Special Requests     Final   Value:  BOTTLES DRAWN AEROBIC AND ANAEROBIC 6CC EACH BOTTLE   Culture NO GROWTH 5 DAYS   Final   Report Status 06/19/2014 FINAL   Final  CULTURE, BLOOD (ROUTINE X 2)     Status: None   Collection Time    06/14/14  8:45 AM      Result Value Ref Range Status   Specimen Description BLOOD RIGHT ARM   Final   Special Requests     Final   Value: BOTTLES DRAWN AEROBIC AND ANAEROBIC 6CC EACH BOTTLE   Culture NO GROWTH 5 DAYS   Final   Report Status 06/19/2014 FINAL   Final     Studies: No results found.  Scheduled Meds: . folic acid  1 mg Oral Daily  . HYDROmorphone PCA 0.3 mg/mL   Intravenous 6 times per day  . hydroxyurea  1,000 mg Oral QODAY  . hydroxyurea  500 mg Oral QODAY  . levofloxacin  500 mg Oral Daily  . lidocaine   Topical Once  . lidocaine   Topical Once  . senna-docusate  1 tablet Oral BID  . Vitamin D (Ergocalciferol)  50,000 Units Oral Q7 days   Continuous Infusions:   Principal Problem:   Sickle cell anemia with crisis Active Problems:   Elevated LFTs   HTN (hypertension)   Transfusion associated hemochromatosis   Vitamin D deficiency   Sickle cell crisis   Macrocytosis   Anxiety state, unspecified   Nosebleed    Time spent: 25 minutes    [facility] Pager [phone]. If 7PM-7AM, please contact night-coverage at [email or web address], password [facility] 06/19/2014, 6:38 PM  LOS: 7 days

## 2014-06-20 MED ORDER — OXYCODONE HCL 5 MG PO TABS
10.0000 mg | ORAL_TABLET | ORAL | Status: DC | PRN
  Administered 2014-06-20 – 2014-06-24 (×14): 10 mg via ORAL
  Filled 2014-06-20 (×15): qty 2

## 2014-06-20 MED ORDER — KETOROLAC TROMETHAMINE 15 MG/ML IJ SOLN
15.0000 mg | Freq: Four times a day (QID) | INTRAMUSCULAR | Status: AC
  Administered 2014-06-20 – 2014-06-22 (×8): 15 mg via INTRAVENOUS
  Filled 2014-06-20 (×8): qty 1

## 2014-06-20 NOTE — Progress Notes (Signed)
[facility] PROGRESS NOTE  Dawn [name] UUV:[id number] DOB: 09/28/1980 DOA: 06/12/2014 PCP: [name],[name], MD  Assessment/Plan: 1. Sickle cell anemia with crisis.  Overall the patient has improved with current pain regimen. Continue  PCA pump at current settings with Dilaudid bolus's every 2 hours and continuous infusion at night. Continue Senokot S. twice a day and Mirilax to avoid opiate-induced constipation. Hemoglobin stable s/p 1 unit PRBC's. If she remains stable today, will discontinue dilaudid for breakthrough pain and start oxycodone.  If she tolerates this change, will consider discontinuing PCA tomorrow. Patient encouraged to ambulate Macrocytosis, likely secondary to sickle cell anemia and/or hydroxyurea.  Continue folic acid.  Nosebleed  Small amount bleeding from nose this am. It quickly resolved.  Platelets normal. BP  Contnue afrin nasal spray as needed. Monitor closely. No Lovenox and provide SCD's. Monitor.   Fever:  Afebrile for last 72 hours. Urinalysis unremarkable, chest xray with no acute process, blood cultures no growth to date. She does have dry cough and diminished BS. She has completed a course of levaquin.  She is otherwise stable.  Elevated LFTs.  This is secondary to sickle cell crisis. Stable.  Vitamin D deficiency.  Continue vitamin D supplementation  Anxiety  Improved with xanax QID.    Code Status: full Family Communication: none present Disposition Plan: home when ready   Consultants:  none  Procedures:  none  Antibiotics:  Levaquin 06/14/14>>>9/26  HPI/Subjective: Had a rough night.  Complaining of pain in her back today.  Objective: Filed Vitals:   06/20/14 0800  BP:   Pulse:   Temp:   Resp: 16    Intake/Output Summary (Last 24 hours) at 06/20/14 1028 Last data filed at 06/20/14 0400  Gross per 24 hour  Intake 719.32 ml  Output      0 ml  Net 719.32 ml   Filed Weights   06/12/14 1841 06/12/14 2223  Weight:  69.854 kg (154 lb) 69.854 kg (154 lb)    Exam:   General:  Well nourished, no distress  Cardiovascular: RRR no m/g/r no LE edema  Respiratory: normal effort BS with good air flow. No wheeze or rhonchi  Abdomen: obese soft non-tender to palpation +BS  Musculoskeletal: no clubbing or cyanosis   Data Reviewed: Basic Metabolic Panel:  Recent Labs Lab 06/14/14 0834 06/16/14 0611 06/17/14 0649 06/18/14 0635  NA 141 142 141 141  K 3.8 3.6* 3.6* 4.0  CL 107 106 105 105  CO2 24 26 28 26   GLUCOSE 101* 94 89 86  BUN 8 6 3* 4*  CREATININE 0.59 0.62 0.47* 0.51  CALCIUM 8.0* 8.0* 8.0* 8.5   Liver Function Tests:  Recent Labs Lab 06/16/14 0611  AST 77*  ALT 32  ALKPHOS 116  BILITOT 4.8*  PROT 6.2  ALBUMIN 2.4*   No results found for this basename: LIPASE, AMYLASE,  in the last 168 hours No results found for this basename: AMMONIA,  in the last 168 hours CBC:  Recent Labs Lab 06/15/14 2113 06/16/14 0207 06/16/14 0611 06/17/14 0649 06/18/14 0635 06/19/14 0734  WBC 11.0* 8.2 7.9 6.5 6.9 9.0  NEUTROABS 6.7 4.7  --   --   --   --   HGB 6.3* 7.5* 7.6* 7.3* 8.3* 7.8*  HCT 16.9* 20.6* 20.8* 20.2* 22.9* 22.2*  MCV 108.3* 103.0* 104.0* 104.1* 104.1* 104.2*  PLT 184 194 206 185 190 149*   Cardiac Enzymes: No results found for this basename: CKTOTAL, CKMB, CKMBINDEX, TROPONINI,  in  the last 168 hours BNP (last 3 results) No results found for this basename: PROBNP,  in the last 8760 hours CBG: No results found for this basename: GLUCAP,  in the last 168 hours  Recent Results (from the past 240 hour(s))  CULTURE, BLOOD (ROUTINE X 2)     Status: None   Collection Time    06/14/14  8:45 AM      Result Value Ref Range Status   Specimen Description BLOOD RIGHT HAND   Final   Special Requests     Final   Value: BOTTLES DRAWN AEROBIC AND ANAEROBIC 6CC EACH BOTTLE   Culture NO GROWTH 5 DAYS   Final   Report Status 06/19/2014 FINAL   Final  CULTURE, BLOOD (ROUTINE X 2)      Status: None   Collection Time    06/14/14  8:45 AM      Result Value Ref Range Status   Specimen Description BLOOD RIGHT ARM   Final   Special Requests     Final   Value: BOTTLES DRAWN AEROBIC AND ANAEROBIC 6CC EACH BOTTLE   Culture NO GROWTH 5 DAYS   Final   Report Status 06/19/2014 FINAL   Final     Studies: No results found.  Scheduled Meds: . folic acid  1 mg Oral Daily  . HYDROmorphone PCA 0.3 mg/mL   Intravenous 6 times per day  . hydroxyurea  1,000 mg Oral QODAY  . hydroxyurea  500 mg Oral QODAY  . ketorolac  15 mg Intravenous 4 times per day  . lidocaine   Topical Once  . lidocaine   Topical Once  . senna-docusate  1 tablet Oral BID  . Vitamin D (Ergocalciferol)  50,000 Units Oral Q7 days   Continuous Infusions:   Principal Problem:   Sickle cell anemia with crisis Active Problems:   Elevated LFTs   HTN (hypertension)   Transfusion associated hemochromatosis   Vitamin D deficiency   Sickle cell crisis   Macrocytosis   Anxiety state, unspecified   Nosebleed    Time spent: 25 minutes    [facility] Pager [phone]. If 7PM-7AM, please contact night-coverage at [email or web address], password [facility] 06/20/2014, 10:28 AM  LOS: 8 days

## 2014-06-20 NOTE — Plan of Care (Signed)
Problem: Phase I Progression Outcomes Goal: Bowel Movement At Least Every 3 Days Outcome: Completed/Met Date Met:  06/20/14 Also the patient is on a bowel plan.

## 2014-06-21 NOTE — Progress Notes (Signed)
[facility] PROGRESS NOTE  Dawn [name] OZH:[id number] DOB: 07-Feb-1981 DOA: 06/12/2014 PCP: [name],[name], MD  Assessment/Plan: 1. Sickle cell anemia with crisis.  Continues to  improve with current pain regimen. Will discontinue PCA pump today. Continue oxycodone scheduled.  Continue Senokot S. twice a day and Mirilax to avoid opiate-induced constipation. Hemoglobin stable s/p 1 unit PRBC's. Mobilize  Macrocytosis, likely secondary to sickle cell anemia and/or hydroxyurea.  Continue folic acid.   Nosebleed  Resolved.  Platelets normal. BP Contnue afrin nasal spray as needed. Monitor closely. No Lovenox and provide SCD's. Monitor.   Fever:  Afebrile. Urinalysis unremarkable, chest xray with no acute process, blood cultures no growth to date. She has completed a course of levaquin. She is otherwise stable.   Elevated LFTs.  This is secondary to sickle cell crisis. Stable.   Vitamin D deficiency.  Continue vitamin D supplementation   Anxiety   stable with  xanax QID.    Code Status: full Family Communication: none Disposition Plan: home hopefully tomorrow   Consultants:  none  Procedures:  none  Antibiotics:   Levaquin 06/14/14 >>>06/19/14  HPI/Subjective: Awake alert. States "the pain is a little better".  Reports ready to discontinue PCA "around lunchtime".   Objective: Filed Vitals:   06/21/14 0614  BP:   Pulse:   Temp:   Resp: 16    Intake/Output Summary (Last 24 hours) at 06/21/14 0939 Last data filed at 06/20/14 1800  Gross per 24 hour  Intake    480 ml  Output      0 ml  Net    480 ml   Filed Weights   06/12/14 1841 06/12/14 2223  Weight: 69.854 kg (154 lb) 69.854 kg (154 lb)    Exam:   General:  Obese appears comfortable  Cardiovascular: RRR No MGR no LE edema  Respiratory: normal effort BS clear bilaterally no wheeze no rhonchi  Abdomen: obese soft +BS non tender to palpitation  Musculoskeletal:  No clubbing or  cyanosis  Data Reviewed: Basic Metabolic Panel:  Recent Labs Lab 06/16/14 0611 06/17/14 0649 06/18/14 0635  NA 142 141 141  K 3.6* 3.6* 4.0  CL 106 105 105  CO2 26 28 26   GLUCOSE 94 89 86  BUN 6 3* 4*  CREATININE 0.62 0.47* 0.51  CALCIUM 8.0* 8.0* 8.5   Liver Function Tests:  Recent Labs Lab 06/16/14 0611  AST 77*  ALT 32  ALKPHOS 116  BILITOT 4.8*  PROT 6.2  ALBUMIN 2.4*   No results found for this basename: LIPASE, AMYLASE,  in the last 168 hours No results found for this basename: AMMONIA,  in the last 168 hours CBC:  Recent Labs Lab 06/15/14 2113 06/16/14 0207 06/16/14 0611 06/17/14 0649 06/18/14 0635 06/19/14 0734  WBC 11.0* 8.2 7.9 6.5 6.9 9.0  NEUTROABS 6.7 4.7  --   --   --   --   HGB 6.3* 7.5* 7.6* 7.3* 8.3* 7.8*  HCT 16.9* 20.6* 20.8* 20.2* 22.9* 22.2*  MCV 108.3* 103.0* 104.0* 104.1* 104.1* 104.2*  PLT 184 194 206 185 190 149*   Cardiac Enzymes: No results found for this basename: CKTOTAL, CKMB, CKMBINDEX, TROPONINI,  in the last 168 hours BNP (last 3 results) No results found for this basename: PROBNP,  in the last 8760 hours CBG: No results found for this basename: GLUCAP,  in the last 168 hours  Recent Results (from the past 240 hour(s))  CULTURE, BLOOD (ROUTINE X 2)  Status: None   Collection Time    06/14/14  8:45 AM      Result Value Ref Range Status   Specimen Description BLOOD RIGHT HAND   Final   Special Requests     Final   Value: BOTTLES DRAWN AEROBIC AND ANAEROBIC 6CC EACH BOTTLE   Culture NO GROWTH 5 DAYS   Final   Report Status 06/19/2014 FINAL   Final  CULTURE, BLOOD (ROUTINE X 2)     Status: None   Collection Time    06/14/14  8:45 AM      Result Value Ref Range Status   Specimen Description BLOOD RIGHT ARM   Final   Special Requests     Final   Value: BOTTLES DRAWN AEROBIC AND ANAEROBIC 6CC EACH BOTTLE   Culture NO GROWTH 5 DAYS   Final   Report Status 06/19/2014 FINAL   Final     Studies: No results  found.  Scheduled Meds: . folic acid  1 mg Oral Daily  . HYDROmorphone PCA 0.3 mg/mL   Intravenous 6 times per day  . hydroxyurea  1,000 mg Oral QODAY  . hydroxyurea  500 mg Oral QODAY  . ketorolac  15 mg Intravenous 4 times per day  . lidocaine   Topical Once  . lidocaine   Topical Once  . senna-docusate  1 tablet Oral BID  . Vitamin D (Ergocalciferol)  50,000 Units Oral Q7 days   Continuous Infusions:   Principal Problem:   Sickle cell anemia with crisis Active Problems:   Elevated LFTs   HTN (hypertension)   Transfusion associated hemochromatosis   Vitamin D deficiency   Sickle cell crisis   Macrocytosis   Anxiety state, unspecified   Nosebleed    Time spent: 35 minutes    [facility] Pager [phone]. If 7PM-7AM, please contact night-coverage at [email or web address], password [facility] 06/21/2014, 9:39 AM  LOS: 9 days

## 2014-06-21 NOTE — Progress Notes (Signed)
Patient seen and examined. Note reviewed  Patient reports feeling better today. Pain appears to be manageable. She was started on oral oxycodone yesterday. We'll plan on discontinuing PCA today and observe. If she remains stable, anticipate discharge home tomorrow.  Kenise Barraco

## 2014-06-22 MED ORDER — HYDROMORPHONE HCL 1 MG/ML IJ SOLN
1.0000 mg | INTRAMUSCULAR | Status: DC | PRN
  Administered 2014-06-22 – 2014-06-23 (×4): 1 mg via INTRAVENOUS
  Filled 2014-06-22 (×4): qty 1

## 2014-06-22 MED ORDER — HYDROMORPHONE HCL 1 MG/ML IJ SOLN
2.0000 mg | Freq: Once | INTRAMUSCULAR | Status: AC
  Administered 2014-06-22: 2 mg via INTRAVENOUS
  Filled 2014-06-22: qty 2

## 2014-06-22 MED ORDER — OXYCODONE HCL ER 20 MG PO T12A
20.0000 mg | EXTENDED_RELEASE_TABLET | Freq: Two times a day (BID) | ORAL | Status: DC
  Administered 2014-06-22 – 2014-06-24 (×5): 20 mg via ORAL
  Filled 2014-06-22 (×5): qty 1

## 2014-06-22 NOTE — Progress Notes (Signed)
[facility] PROGRESS NOTE  Dawn [name] TOI:[id number] DOB: [birth year] DOA: 06/12/2014 PCP: [name],[name], MD  Assessment/Plan: 1. Sickle cell anemia with crisis.   Pain worsening today. May benefit from long actin pain med. Will start oxycontin 20mg  every 12 hours in addition to prn Oxy IR. She was given one dose IV dilaudid as well.  Continue oxycodone scheduled. Continue Senokot S. twice a day and Mirilax to avoid opiate-induced constipation. Hemoglobin stable s/p 1 unit PRBC's. Mobilize   Macrocytosis, likely secondary to sickle cell anemia and/or hydroxyurea.  Continue folic acid.   Nosebleed  Resolved. Platelets normal. BP Contnue afrin nasal spray as needed. Monitor closely. No Lovenox and provide SCD's. Monitor.   Fever:  Afebrile. Urinalysis unremarkable, chest xray with no acute process, blood cultures no growth to date. She has completed a course of levaquin. She is otherwise stable.   Elevated LFTs.  This is secondary to sickle cell crisis. Stable.   Vitamin D deficiency.  Continue vitamin D supplementation  Anxiety  stable with xanax QID.    Code Status: full  Family Communication: none present Disposition Plan: if not improved tomorrow, consider calling Dr. Zigmund Daniel   Consultants:  none  Procedures:  none  Antibiotics:  levaquin 06/14/14-06/19/14  HPI/Subjective: Sitting up in bed crying. Reports worsening pain.   Objective: Filed Vitals:   06/22/14 0615  BP: 118/64  Pulse: 96  Temp: 98.5 F (36.9 C)  Resp: 20    Intake/Output Summary (Last 24 hours) at 06/22/14 1304 Last data filed at 06/21/14 1900  Gross per 24 hour  Intake    480 ml  Output      0 ml  Net    480 ml   Filed Weights   06/12/14 1841 06/12/14 2223  Weight: 69.854 kg (154 lb) 69.854 kg (154 lb)    Exam:   General:  Well nourished appears comfortable  Cardiovascular: RRR No MGR No LE edema  Respiratory: normal effort BS clear bilaterally no rhonchi or  wheeze  Abdomen: round soft +BS non-tender to palpation  Musculoskeletal: joints without swelling/erythema  Data Reviewed: Basic Metabolic Panel:  Recent Labs Lab 06/16/14 0611 06/17/14 0649 06/18/14 0635  NA 142 141 141  K 3.6* 3.6* 4.0  CL 106 105 105  CO2 26 28 26   GLUCOSE 94 89 86  BUN 6 3* 4*  CREATININE 0.62 0.47* 0.51  CALCIUM 8.0* 8.0* 8.5   Liver Function Tests:  Recent Labs Lab 06/16/14 0611  AST 77*  ALT 32  ALKPHOS 116  BILITOT 4.8*  PROT 6.2  ALBUMIN 2.4*   No results found for this basename: LIPASE, AMYLASE,  in the last 168 hours No results found for this basename: AMMONIA,  in the last 168 hours CBC:  Recent Labs Lab 06/15/14 2113 06/16/14 0207 06/16/14 0611 06/17/14 0649 06/18/14 0635 06/19/14 0734  WBC 11.0* 8.2 7.9 6.5 6.9 9.0  NEUTROABS 6.7 4.7  --   --   --   --   HGB 6.3* 7.5* 7.6* 7.3* 8.3* 7.8*  HCT 16.9* 20.6* 20.8* 20.2* 22.9* 22.2*  MCV 108.3* 103.0* 104.0* 104.1* 104.1* 104.2*  PLT 184 194 206 185 190 149*   Cardiac Enzymes: No results found for this basename: CKTOTAL, CKMB, CKMBINDEX, TROPONINI,  in the last 168 hours BNP (last 3 results) No results found for this basename: PROBNP,  in the last 8760 hours CBG: No results found for this basename: GLUCAP,  in the last 168 hours  Recent Results (from  the past 240 hour(s))  CULTURE, BLOOD (ROUTINE X 2)     Status: None   Collection Time    06/14/14  8:45 AM      Result Value Ref Range Status   Specimen Description BLOOD RIGHT HAND   Final   Special Requests     Final   Value: BOTTLES DRAWN AEROBIC AND ANAEROBIC 6CC EACH BOTTLE   Culture NO GROWTH 5 DAYS   Final   Report Status 06/19/2014 FINAL   Final  CULTURE, BLOOD (ROUTINE X 2)     Status: None   Collection Time    06/14/14  8:45 AM      Result Value Ref Range Status   Specimen Description BLOOD RIGHT ARM   Final   Special Requests     Final   Value: BOTTLES DRAWN AEROBIC AND ANAEROBIC 6CC EACH BOTTLE   Culture  NO GROWTH 5 DAYS   Final   Report Status 06/19/2014 FINAL   Final     Studies: No results found.  Scheduled Meds: . folic acid  1 mg Oral Daily  . HYDROmorphone PCA 0.3 mg/mL   Intravenous 6 times per day  . hydroxyurea  1,000 mg Oral QODAY  . hydroxyurea  500 mg Oral QODAY  . lidocaine   Topical Once  . lidocaine   Topical Once  . OxyCODONE  20 mg Oral Q12H  . senna-docusate  1 tablet Oral BID  . Vitamin D (Ergocalciferol)  50,000 Units Oral Q7 days   Continuous Infusions:   Principal Problem:   Sickle cell anemia with crisis Active Problems:   Elevated LFTs   HTN (hypertension)   Transfusion associated hemochromatosis   Vitamin D deficiency   Sickle cell crisis   Macrocytosis   Anxiety state, unspecified   Nosebleed    Time spent: [facility] Pager [phone]. If 7PM-7AM, please contact night-coverage at [email or web address], password [facility] 06/22/2014, 1:04 PM  LOS: 10 days

## 2014-06-22 NOTE — Progress Notes (Signed)
Patient seen and examined. Above note reviewed.  Patient continues to have significant pain, uncontrolled with po medications.  Will start her on oxycontin for long acting pain meds. Continue oxycodone for breakthrough. Anticipate discharge in next 24-48 hours.  [name]

## 2014-06-22 NOTE — Progress Notes (Signed)
Patient complaining of pain 6 out of 10 even after receiving oxy IR @ 1025. MD was made aware. MD ordered a one time dose of 2mg  dilaudid IV. Patient received dilaudid @1221 . Will continue to monitor patient.

## 2014-06-23 LAB — CBC
HCT: 21.1 % — ABNORMAL LOW (ref 36.0–46.0)
HEMOGLOBIN: 7.7 g/dL — AB (ref 12.0–15.0)
MCH: 38.3 pg — AB (ref 26.0–34.0)
MCHC: 36.5 g/dL — ABNORMAL HIGH (ref 30.0–36.0)
MCV: 105 fL — ABNORMAL HIGH (ref 78.0–100.0)
Platelets: 233 10*3/uL (ref 150–400)
RBC: 2.01 MIL/uL — AB (ref 3.87–5.11)
RDW: 21.6 % — ABNORMAL HIGH (ref 11.5–15.5)
WBC: 6.8 10*3/uL (ref 4.0–10.5)

## 2014-06-23 NOTE — Progress Notes (Signed)
[facility] PROGRESS NOTE  Dawn [name] RCV:[id number] DOB: 12/13/1980 DOA: 06/12/2014 PCP: [name],[name], MD  Assessment/Plan: 1. Sickle cell anemia with crisis.  Pain had been worsening over the past several days. Started on oxycontin 20mg  every 12 hours in addition to prn Oxy IR. Pt reports gradually improving pain, not yet at baseline. Continue Senokot S. twice a day and Mirilax to avoid opiate-induced constipation. Hemoglobin stable s/p 1 unit PRBC's. Mobilize  Macrocytosis, likely secondary to sickle cell anemia and/or hydroxyurea.  Continue folic acid.  Nosebleed  Resolved. Platelets normal. BP Contnue afrin nasal spray as needed. Monitor closely. No Lovenox and provide SCD's. Monitor.  Fever:  Afebrile. Urinalysis unremarkable, chest xray with no acute process, blood cultures no growth to date. She has completed a course of levaquin. She is otherwise stable.  Elevated LFTs.  This is secondary to sickle cell crisis. Stable.  Vitamin D deficiency.  Continue vitamin D supplementation  Anxiety  stable with xanax QID.    Code Status: Full Family Communication: Pt in room (indicate person spoken with, relationship, and if by phone, the number) Disposition Plan: Pending   Consultants:  none  Procedures:  none  Antibiotics: levaquin 06/14/14-06/19/14  HPI/Subjective: Reports pain down to 4/10. States she does not yet feel at baseline.  Objective: Filed Vitals:   06/22/14 0615 06/22/14 1608 06/22/14 2200 06/23/14 0500  BP: 118/64 107/61 114/62 107/53  Pulse: 96 94 93 88  Temp: 98.5 F (36.9 C) 98.7 F (37.1 C) 97.9 F (36.6 C) 98.1 F (36.7 C)  TempSrc: Oral Oral Oral Oral  Resp: 20 20 20 19   Height:      Weight:      SpO2: 100% 97% 99% 96%    Intake/Output Summary (Last 24 hours) at 06/23/14 1303 Last data filed at 06/23/14 0800  Gross per 24 hour  Intake    240 ml  Output      0 ml  Net    240 ml   Filed Weights   06/12/14 1841 06/12/14 2223   Weight: 69.854 kg (154 lb) 69.854 kg (154 lb)    Exam:   General:  Awake, in nad  Cardiovascular: regular, s1, s2  Respiratory: normal resp effort, no wheezing  Abdomen: soft,nondistended  Musculoskeletal: perfused,no clubbing   Data Reviewed: Basic Metabolic Panel:  Recent Labs Lab 06/17/14 0649 06/18/14 0635  NA 141 141  K 3.6* 4.0  CL 105 105  CO2 28 26  GLUCOSE 89 86  BUN 3* 4*  CREATININE 0.47* 0.51  CALCIUM 8.0* 8.5   Liver Function Tests: No results found for this basename: AST, ALT, ALKPHOS, BILITOT, PROT, ALBUMIN,  in the last 168 hours No results found for this basename: LIPASE, AMYLASE,  in the last 168 hours No results found for this basename: AMMONIA,  in the last 168 hours CBC:  Recent Labs Lab 06/17/14 0649 06/18/14 0635 06/19/14 0734 06/23/14 0628  WBC 6.5 6.9 9.0 6.8  HGB 7.3* 8.3* 7.8* 7.7*  HCT 20.2* 22.9* 22.2* 21.1*  MCV 104.1* 104.1* 104.2* 105.0*  PLT 185 190 149* 233   Cardiac Enzymes: No results found for this basename: CKTOTAL, CKMB, CKMBINDEX, TROPONINI,  in the last 168 hours BNP (last 3 results) No results found for this basename: PROBNP,  in the last 8760 hours CBG: No results found for this basename: GLUCAP,  in the last 168 hours  Recent Results (from the past 240 hour(s))  CULTURE, BLOOD (ROUTINE X 2)  Status: None   Collection Time    06/14/14  8:45 AM      Result Value Ref Range Status   Specimen Description BLOOD RIGHT HAND   Final   Special Requests     Final   Value: BOTTLES DRAWN AEROBIC AND ANAEROBIC 6CC EACH BOTTLE   Culture NO GROWTH 5 DAYS   Final   Report Status 06/19/2014 FINAL   Final  CULTURE, BLOOD (ROUTINE X 2)     Status: None   Collection Time    06/14/14  8:45 AM      Result Value Ref Range Status   Specimen Description BLOOD RIGHT ARM   Final   Special Requests     Final   Value: BOTTLES DRAWN AEROBIC AND ANAEROBIC 6CC EACH BOTTLE   Culture NO GROWTH 5 DAYS   Final   Report Status  06/19/2014 FINAL   Final     Studies: No results found.  Scheduled Meds: . folic acid  1 mg Oral Daily  . hydroxyurea  1,000 mg Oral QODAY  . hydroxyurea  500 mg Oral QODAY  . lidocaine   Topical Once  . lidocaine   Topical Once  . OxyCODONE  20 mg Oral Q12H  . senna-docusate  1 tablet Oral BID  . Vitamin D (Ergocalciferol)  50,000 Units Oral Q7 days   Continuous Infusions:   Principal Problem:   Sickle cell anemia with crisis Active Problems:   Elevated LFTs   HTN (hypertension)   Transfusion associated hemochromatosis   Vitamin D deficiency   Sickle cell crisis   Macrocytosis   Anxiety state, unspecified   Nosebleed  Time spent: 26min  [facility], [facility] Pager [phone]. If 7PM-7AM, please contact night-coverage at [email or web address], password [facility] 06/23/2014, 1:03 PM  LOS: 11 days

## 2014-06-24 DIAGNOSIS — D5701 Hb-SS disease with acute chest syndrome: Secondary | ICD-10-CM

## 2014-06-24 DIAGNOSIS — I10 Essential (primary) hypertension: Secondary | ICD-10-CM

## 2014-06-24 DIAGNOSIS — D5700 Hb-SS disease with crisis, unspecified: Principal | ICD-10-CM

## 2014-06-24 MED ORDER — OXYCODONE HCL 10 MG PO TABS: 10.0000 mg | ORAL_TABLET | ORAL | Status: DC | PRN

## 2014-06-24 MED ORDER — OXYCODONE HCL ER 20 MG PO T12A: 20.0000 mg | EXTENDED_RELEASE_TABLET | Freq: Two times a day (BID) | ORAL | Status: DC

## 2014-06-24 NOTE — Discharge Summary (Signed)
Physician Discharge Summary  Dawn [name] HDQ:[id number] DOB: [birth year] DOA: 06/12/2014  PCP: [name],[name], MD  Admit date: 06/12/2014 Discharge date: 06/24/2014  Time spent: 40 minutes  Recommendations for Outpatient Follow-up:  1. Follow up with PCP 1 week for evaluation of symptoms and pain medicine regimen as long acting added   Discharge Diagnoses:  Principal Problem:   Sickle cell anemia with crisis Active Problems:   Elevated LFTs   HTN (hypertension)   Transfusion associated hemochromatosis   Vitamin D deficiency   Sickle cell crisis   Macrocytosis   Anxiety state, unspecified   Nosebleed   Discharge Condition: stable  Diet recommendation: regular  Filed Weights   06/12/14 1841 06/12/14 2223  Weight: 69.854 kg (154 lb) 69.854 kg (154 lb)    History of present illness:  33 yo female presented to ED on 06/09/14 with several days of her routine sickle cell pain mainly in her legs that had not been relieved with her routine home pain meds. No fevers. No sob. No cough. No dysuria. No chest pain. Pain some better but had received dilaudid 6mg  total iv over the last several hours and her pain remained 7/10.   Hospital Course:  1. Sickle cell anemia with crisis.   Admitted to tele with crisis. Provided with IV dilaudid, IV hydration. Pain difficult to control. Case discussed with Dr Zigmund Daniel on 06/17/14 who is patient primary. She made recommendations regarding management of PCA Dilaudid as well as further pain management based on patients medicine requirements. She began to improve. Pain meds transitioned to PO. Started on oxycontin 20mg  every 12 hours in addition to prn Oxy IR. Pt reported pain at baseline 3/10 at discharge. Hemoglobin stable s/p 1 unit PRBC's. Mobilize  Macrocytosis, likely secondary to sickle cell anemia and/or hydroxyurea.  Provided with folic acid  Nosebleed  Resolved. Platelets normal. Afrin nasal spray as needed. .  Fever:   Fever on day #2.  Urinalysis unremarkable, chest xray with no acute process, blood cultures no growth to date. She completed a course of levaquin. Afebrile and non-toxic at discharge.  Elevated LFTs.  This is secondary to sickle cell crisis. Stable.  Vitamin D deficiency.  Continue vitamin D supplementation  Anxiety  stable with xanax QID.   Procedures:  none  Consultations:  none  Discharge Exam: Filed Vitals:   06/24/14 0555  BP: 104/58  Pulse: 96  Temp: 98.8 F (37.1 C)  Resp: 18    General: well nourished NAD Cardiovascular: RRR No MGR no LE edema Respiratory: normal effort BS clear bilaterally no wheeze  Discharge Instructions You were cared for by a hospitalist during your hospital stay. If you have any questions about your discharge medications or the care you received while you were in the hospital after you are discharged, you can call the unit and asked to speak with the hospitalist on call if the hospitalist that took care of you is not available. Once you are discharged, your primary care physician will handle any further medical issues. Please note that NO REFILLS for any discharge medications will be authorized once you are discharged, as it is imperative that you return to your primary care physician (or establish a relationship with a primary care physician if you do not have one) for your aftercare needs so that they can reassess your need for medications and monitor your lab values.   Current Discharge Medication List    START taking these medications   Details  OxyCODONE (OXYCONTIN) 20  mg T12A 12 hr tablet Take 1 tablet (20 mg total) by mouth every 12 (twelve) hours. Qty: 60 tablet, Refills: 0      CONTINUE these medications which have CHANGED   Details  Oxycodone HCl 10 MG TABS Take 1 tablet (10 mg total) by mouth every 4 (four) hours as needed (pain). 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    folic acid (FOLVITE) 1 MG tablet Take 1 tablet (1 mg total) by mouth daily. Qty: 30 tablet, Refills: 11    hydroxyurea (HYDREA) 500 MG capsule Take 1-2 capsules (500-1,000 mg total) by mouth as directed. Take 500mg  (1 tablet) ON ODD DAYS AND 1000 MG (2) TABLETS ON THE EVEN DAYS. 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 Takes on Sundays. Qty: 30 capsule, Refills: 11   Associated Diagnoses: Vitamin D deficiency       Allergies  Allergen Reactions  . Desferal [Deferoxamine] Hives    Local reaction on arm only during infusion. Can take with benadryl   . Latex Other (See Comments)    REACTION: Pt experiences a burning sensation on contacted skin areas  . Lisinopril Other (See Comments) and Cough    REACTION: Sore/scratchy throat  . Tape Other (See Comments)    REACTION: Pt. Experiences a burning sensation on contacted skin areas   Follow-up Information   Follow up with [name],[name], MD. Schedule an appointment as soon as possible for a visit in 1 week. (follow up on symptoms)    Specialty:  Internal Medicine   Contact information:   [address] [place] [address] [phone]        The results of significant diagnostics from this hospitalization (including imaging, microbiology, ancillary and laboratory) are listed below for reference.    Significant Diagnostic Studies: Dg Chest 1 View  06/14/2014   CLINICAL DATA:  Fever, history of sickle  cell disease  EXAM: CHEST - 1 VIEW  COMPARISON:  03/28/2014  FINDINGS: Borderline cardiomegaly. No acute infiltrate or pleural effusion. No pulmonary edema. Bony thorax is unremarkable.  IMPRESSION: No active disease.   Electronically Signed   By: Lahoma Crocker M.D.   On: 06/14/2014 08:24   Dg Chest Port 1 View  06/16/2014   CLINICAL DATA:  Shortness of breath. Rule out pneumonia. History of sickle cell disease.  EXAM: PORTABLE CHEST - 1 VIEW  COMPARISON:  06/14/2014  FINDINGS: There is chronic mild cardiomegaly. Prominence of the mid right mediastinal contour is likely projectional based on previous exam. Diffuse interstitial prominence which is above baseline. Blood flow is cephalized. No asymmetric opacity or effusion suggestive of pneumonia.  IMPRESSION: Cardiomegaly and mild pulmonary edema.  No focal pneumonia.   Electronically Signed   By: Jorje Guild M.D.   On: 06/16/2014 00:13    Microbiology: No results found for this or  any previous visit (from the past 240 hour(s)).   Labs: Basic Metabolic Panel:  Recent Labs Lab 06/18/14 0635  NA 141  K 4.0  CL 105  CO2 26  GLUCOSE 86  BUN 4*  CREATININE 0.51  CALCIUM 8.5   Liver Function Tests: No results found for this basename: AST, ALT, ALKPHOS, BILITOT, PROT, ALBUMIN,  in the last 168 hours No results found for this basename: LIPASE, AMYLASE,  in the last 168 hours No results found for this basename: AMMONIA,  in the last 168 hours CBC:  Recent Labs Lab 06/18/14 0635 06/19/14 0734 06/23/14 0628  WBC 6.9 9.0 6.8  HGB 8.3* 7.8* 7.7*  HCT 22.9* 22.2* 21.1*  MCV 104.1* 104.2* 105.0*  PLT 190 149* 233   Cardiac Enzymes: No results found for this basename: CKTOTAL, CKMB, CKMBINDEX, TROPONINI,  in the last 168 hours BNP: BNP (last 3 results) No results found for this basename: PROBNP,  in the last 8760 hours CBG: No results found for this basename: GLUCAP,  in the last 168 hours     Signed:  Radene Gunning  [facility] 06/24/2014, 11:42 AM

## 2014-06-24 NOTE — Progress Notes (Signed)
NURSING PROGRESS NOTE  QUIN MCPHERSON [id number] Discharge Data: 06/24/2014 1:52 PM Attending Provider: No att. providers found RXY:[id number],MQKMMNOT A., MD   Yomaris L Levels to be D/C'd Home per MD order.    All IV's discontinued and monitored for bleeding.  All belongings returned to patient for patient to take home.  AVS summary reviewed with patient.  Patient left floor ambulatory, escorted by NT. Patient requests to drive home from hospital independently. Last pain medication given at 10:02AM. Patient shows no signs of lethargy or cognitive impairment and states she feels comfortable driving home. MD also gave okay for patient to drive.  Last Documented Vital Signs:  Blood pressure 104/58, pulse 96, temperature 98.8 F (37.1 C), temperature source Oral, resp. rate 18, height 5\' 3"  (1.6 m), weight 69.854 kg (154 lb), SpO2 93.00%.  Cecilie Kicks D

## 2014-06-24 NOTE — Progress Notes (Signed)
UR chart review completed.  

## 2014-06-24 NOTE — Discharge Summary (Signed)
Pt seen and examined. Agree with above discharge summary. Briefly, pt presents with sickle cell pain crisis. Pt responded well with addition of long-acting opiate to immediate release pain med. Pt remains stable. Plan d/c with close outpatient follow up .

## 2014-07-02 ENCOUNTER — Ambulatory Visit: Payer: Self-pay | Admitting: Family Medicine

## 2014-07-20 ENCOUNTER — Telehealth (HOSPITAL_COMMUNITY): Payer: Self-pay | Admitting: *Deleted

## 2014-07-20 ENCOUNTER — Non-Acute Institutional Stay (HOSPITAL_COMMUNITY)
Admission: AD | Admit: 2014-07-20 | Discharge: 2014-07-20 | Disposition: A | Discharge status: Home / Self Care | Payer: BC Managed Care – PPO | Attending: Internal Medicine | Admitting: Internal Medicine

## 2014-07-20 ENCOUNTER — Encounter (HOSPITAL_COMMUNITY): Payer: Self-pay | Admitting: Hematology

## 2014-07-20 DIAGNOSIS — D571 Sickle-cell disease without crisis: Secondary | ICD-10-CM | POA: Diagnosis present

## 2014-07-20 DIAGNOSIS — Z91040 Latex allergy status: Secondary | ICD-10-CM | POA: Diagnosis not present

## 2014-07-20 DIAGNOSIS — Z91048 Other nonmedicinal substance allergy status: Secondary | ICD-10-CM | POA: Diagnosis not present

## 2014-07-20 DIAGNOSIS — D5700 Hb-SS disease with crisis, unspecified: Secondary | ICD-10-CM | POA: Diagnosis not present

## 2014-07-20 DIAGNOSIS — Z888 Allergy status to other drugs, medicaments and biological substances status: Secondary | ICD-10-CM | POA: Diagnosis not present

## 2014-07-20 LAB — CBC WITH DIFFERENTIAL/PLATELET
BASOS ABS: 0 10*3/uL (ref 0.0–0.1)
Basophils Relative: 0 % (ref 0–1)
EOS PCT: 1 % (ref 0–5)
Eosinophils Absolute: 0.1 10*3/uL (ref 0.0–0.7)
HEMATOCRIT: 23.9 % — AB (ref 36.0–46.0)
Hemoglobin: 8.8 g/dL — ABNORMAL LOW (ref 12.0–15.0)
LYMPHS ABS: 2.4 10*3/uL (ref 0.7–4.0)
Lymphocytes Relative: 37 % (ref 12–46)
MCH: 37.6 pg — ABNORMAL HIGH (ref 26.0–34.0)
MCHC: 36.8 g/dL — ABNORMAL HIGH (ref 30.0–36.0)
MCV: 102.1 fL — AB (ref 78.0–100.0)
MONOS PCT: 14 % — AB (ref 3–12)
Monocytes Absolute: 0.9 10*3/uL (ref 0.1–1.0)
NEUTROS ABS: 3.2 10*3/uL (ref 1.7–7.7)
NRBC: 7 /100{WBCs} — AB
Neutrophils Relative %: 48 % (ref 43–77)
Platelets: ADEQUATE 10*3/uL (ref 150–400)
RBC: 2.34 MIL/uL — AB (ref 3.87–5.11)
RDW: 20.1 % — ABNORMAL HIGH (ref 11.5–15.5)
WBC: 6.6 10*3/uL (ref 4.0–10.5)

## 2014-07-20 LAB — BASIC METABOLIC PANEL
Anion gap: 13 (ref 5–15)
BUN: 5 mg/dL — ABNORMAL LOW (ref 6–23)
CHLORIDE: 101 meq/L (ref 96–112)
CO2: 23 meq/L (ref 19–32)
Calcium: 8.8 mg/dL (ref 8.4–10.5)
Creatinine, Ser: 0.47 mg/dL — ABNORMAL LOW (ref 0.50–1.10)
GFR calc Af Amer: >90 mL/min (ref >90)
Glucose, Bld: 103 mg/dL — ABNORMAL HIGH (ref 70–99)
POTASSIUM: 4 meq/L (ref 3.7–5.3)
SODIUM: 137 meq/L (ref 137–147)

## 2014-07-20 LAB — LACTATE DEHYDROGENASE: LDH: 521 U/L — ABNORMAL HIGH (ref 94–250)

## 2014-07-20 MED ORDER — DIPHENHYDRAMINE HCL 25 MG PO CAPS
25.0000 mg | ORAL_CAPSULE | Freq: Four times a day (QID) | ORAL | Status: DC | PRN
  Administered 2014-07-20: 25 mg via ORAL
  Filled 2014-07-20: qty 1

## 2014-07-20 MED ORDER — DEXTROSE-NACL 5-0.45 % IV SOLN: INTRAVENOUS | Status: DC

## 2014-07-20 MED ORDER — KETOROLAC TROMETHAMINE 30 MG/ML IJ SOLN
30.0000 mg | Freq: Once | INTRAMUSCULAR | Status: AC
  Administered 2014-07-20: 30 mg via INTRAVENOUS
  Filled 2014-07-20: qty 1

## 2014-07-20 MED ORDER — ONDANSETRON HCL 4 MG/2ML IJ SOLN
4.0000 mg | Freq: Four times a day (QID) | INTRAMUSCULAR | Status: DC | PRN
  Administered 2014-07-20: 4 mg via INTRAVENOUS
  Filled 2014-07-20: qty 2

## 2014-07-20 MED ORDER — HYDROMORPHONE HCL 2 MG/ML IJ SOLN
2.5000 mg | Freq: Once | INTRAMUSCULAR | Status: AC
  Administered 2014-07-20: 2.5 mg via INTRAVENOUS
  Filled 2014-07-20: qty 2

## 2014-07-20 MED ORDER — SODIUM CHLORIDE 0.9 % IV BOLUS (SEPSIS): 1000.0000 mL | Freq: Once | INTRAVENOUS | Status: DC

## 2014-07-20 MED ORDER — HYDROMORPHONE HCL 2 MG/ML IJ SOLN
2.1000 mg | Freq: Once | INTRAMUSCULAR | Status: AC
  Administered 2014-07-20: 2.1 mg via INTRAVENOUS
  Filled 2014-07-20: qty 2

## 2014-07-20 MED ORDER — DEXTROSE-NACL 5-0.45 % IV SOLN
INTRAVENOUS | Status: DC
  Administered 2014-07-20: 10:00:00 via INTRAVENOUS

## 2014-07-20 MED ORDER — HYDROMORPHONE HCL 2 MG/ML IJ SOLN
1.7000 mg | Freq: Once | INTRAMUSCULAR | Status: AC
  Administered 2014-07-20: 1.7 mg via INTRAVENOUS
  Filled 2014-07-20: qty 1

## 2014-07-20 MED ORDER — PROMETHAZINE HCL 12.5 MG PO TABS: 12.5000 mg | ORAL_TABLET | Freq: Four times a day (QID) | ORAL | Status: DC | PRN

## 2014-07-20 MED ORDER — OXYCODONE HCL 5 MG PO TABS
10.0000 mg | ORAL_TABLET | Freq: Once | ORAL | Status: AC
  Administered 2014-07-20: 10 mg via ORAL
  Filled 2014-07-20: qty 2

## 2014-07-20 MED ORDER — OXYCODONE HCL 10 MG PO TABS: 10.0000 mg | ORAL_TABLET | ORAL | Status: DC | PRN

## 2014-07-20 NOTE — Telephone Encounter (Signed)
Patient complaining of pain in the back and legs - times 1 day. Pain level currently at a six. Advised that I would inform doctor of request to be seen.

## 2014-07-20 NOTE — Discharge Instructions (Signed)
Sickle Cell Anemia, Adult °Sickle cell anemia is a condition in which red blood cells have an abnormal "sickle" shape. This abnormal shape shortens the cells' life span, which results in a lower than normal concentration of red blood cells in the blood. The sickle shape also causes the cells to clump together and block free blood flow through the blood vessels. As a result, the tissues and organs of the body do not receive enough oxygen. Sickle cell anemia causes organ damage and pain and increases the risk of infection. °CAUSES  °Sickle cell anemia is a genetic disorder. Those who receive two copies of the gene have the condition, and those who receive one copy have the trait. °RISK FACTORS °The sickle cell gene is most common in people whose families originated in [place]. Other areas of the globe where sickle cell trait occurs include the [place], [place] and [place], the [place], and the [place].  °SIGNS AND SYMPTOMS °· Pain, especially in the extremities, back, chest, or abdomen (common). The pain may start suddenly or may develop following an illness, especially if there is dehydration. Pain can also occur due to overexertion or exposure to extreme temperature changes. °· Frequent severe bacterial infections, especially certain types of pneumonia and meningitis. °· Pain and swelling in the hands and feet. °· Decreased activity.   °· Loss of appetite.   °· Change in behavior. °· Headaches. °· Seizures. °· Shortness of breath or difficulty breathing. °· Vision changes. °· Skin ulcers. °Those with the trait may not have symptoms or they may have mild symptoms.  °DIAGNOSIS  °Sickle cell anemia is diagnosed with blood tests that demonstrate the genetic trait. It is often diagnosed during the newborn period, due to mandatory testing nationwide. A variety of blood tests, X-rays, CT scans, MRI scans, ultrasounds, and lung function tests may also be done to monitor the condition. °TREATMENT  °Sickle  cell anemia may be treated with: °· Medicines. You may be given pain medicines, antibiotic medicines (to treat and prevent infections) or medicines to increase the production of certain types of hemoglobin. °· Fluids. °· Oxygen. °· Blood transfusions. °HOME CARE INSTRUCTIONS  °· Drink enough fluid to keep your urine clear or pale yellow. Increase your fluid intake in hot weather and during exercise. °· Do not smoke. Smoking lowers oxygen levels in the blood.   °· Only take over-the-counter or prescription medicines for pain, fever, or discomfort as directed by your health care provider. °· Take antibiotics as directed by your health care provider. Make sure you finish them it even if you start to feel better.   °· Take supplements as directed by your health care provider.   °· Consider wearing a medical alert bracelet. This tells anyone caring for you in an emergency of your condition.   °· When traveling, keep your medical information, health care provider's names, and the medicines you take with you at all times.   °· If you develop a fever, do not take medicines to reduce the fever right away. This could cover up a problem that is developing. Notify your health care provider. °· Keep all follow-up appointments with your health care provider. Sickle cell anemia requires regular medical care. °SEEK MEDICAL CARE IF: ° You have a fever. °SEEK IMMEDIATE MEDICAL CARE IF:  °· You feel dizzy or faint.   °· You have new abdominal pain, especially on the left side near the stomach area.   °· You develop a persistent, often uncomfortable and painful penile erection (priapism). If this is not treated immediately it   will lead to impotence.   °· You have numbness your arms or legs or you have a hard time moving them.   °· You have a hard time with speech.   °· You have a fever or persistent symptoms for more than 2-3 days.   °· You have a fever and your symptoms suddenly get worse.   °· You have signs or symptoms of infection.  These include:   °¨ Chills.   °¨ Abnormal tiredness (lethargy).   °¨ Irritability.   °¨ Poor eating.   °¨ Vomiting.   °· You develop pain that is not helped with medicine.   °· You develop shortness of breath. °· You have pain in your chest.   °· You are coughing up pus-like or bloody sputum.   °· You develop a stiff neck. °· Your feet or hands swell or have pain. °· Your abdomen appears bloated. °· You develop joint pain. °MAKE SURE YOU: °· Understand these instructions. °Document Released: 12/19/2005 Document Revised: 01/25/2014 Document Reviewed: 04/22/2013 °[employer]® Patient Information ©2015 [employer]. This information is not intended to replace advice given to you by your health care provider. Make sure you discuss any questions you have with your health care provider. ° °

## 2014-07-20 NOTE — Discharge Summary (Signed)
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 She states that pain was 9/10 and she took a dose MS Contin 20 mg which was left over from her recent discharge from the hospital. She states that her pain was 7/10 today and is now 6/10 after 2 doses of Dilaudid and 1 dose of Toradol. Pt describes pain as a constant throbbing and not associated with any other symptoms.    Hospital Course:  Pt was treated  initially with weight based rapid re-dosing IVF and Toradol . The pain decreased to  5/10.  She was then given a dose of oral analgesic and 1 additional clinician assisted dose. She did not require the PCA . Her pain went down to 4/10 and patient was discharged home in good condition.     Discharge Exam:  Filed Vitals:   07/20/14 1217  BP: 121/67  Pulse: 79  Temp:   Resp:    Filed Vitals:   07/20/14 0934 07/20/14 1057 07/20/14 1147 07/20/14 1217  BP: 118/66 133/63 134/69 121/67  Pulse: 83 82 84 79  Temp:      TempSrc:      Resp:      Weight:      SpO2:        General: Alert, awake, oriented x3, in no acute distress.  HEENT: [place]/AT PEERL, EOMI, anicteric Neck: Trachea midline,  no masses, no thyromegal,y no JVD, no carotid bruit OROPHARYNX:  Moist, No exudate/ erythema/lesions.  Heart: Regular rate and rhythm, without murmurs, rubs, gallops, PMI non-displaced, no heaves or thrills on palpation.  Lungs: Clear to auscultation, no wheezing  or rhonchi noted. No increased vocal fremitus resonant to percussion  Abdomen: Soft, nontender, nondistended, positive bowel sounds, no masses no hepatosplenomegaly noted..  Neuro: No focal neurological deficits noted cranial nerves II through XII grossly intact. Strength normal in bilateral upper and lower extremities. Musculoskeletal: No warm swelling or erythema around joints, no spinal tenderness noted. Psychiatric: Patient alert and oriented x3, good insight and cognition, good recent to remote recall. Lymph node survey: No cervical axillary or inguinal lymphadenopathy noted.   Discharge Instructions  Discharge Instructions   Activity as tolerated - No restrictions    Complete by:  As directed      Diet general    Complete by:  As directed             Medication List         cetirizine 10 MG tablet  Commonly known as:  ZYRTEC  Take 10 mg by mouth daily.     folic acid 1 MG tablet  Commonly known as:  FOLVITE  Take 1 tablet (1 mg total) by mouth daily.     hydroxyurea 500 MG capsule  Commonly known as:  HYDREA  Take 1-2 capsules (500-1,000 mg total) by mouth as directed. Take 500mg  (1 tablet) ON ODD DAYS AND 1000 MG (2) TABLETS ON THE EVEN  DAYS.     Oxycodone HCl 10 MG Tabs  Take 1 tablet (10 mg total) by mouth every 4 (four) hours as needed (pain).     promethazine 12.5 MG tablet  Commonly known as:  PHENERGAN  Take 1 tablet (12.5 mg total) by mouth every 6 (six) hours as needed for nausea. 1-2 prn begin with 1 if no relief may repeat x's 1     Vitamin D (Ergocalciferol) 50000 UNITS Caps capsule  Commonly known as:  DRISDOL  Take 1 capsule (50,000 Units total) by mouth every 7 (seven) days. Takes on Sundays.          The results of significant diagnostics from this hospitalization (including imaging, microbiology, ancillary and laboratory) are listed below for reference.    Significant Diagnostic Studies: No results found.  Microbiology: No results found  for this or any previous visit (from the past 240 hour(s)).   Labs: Basic Metabolic Panel:  Recent Labs Lab 07/20/14 0914  NA 137  K 4.0  CL 101  CO2 23  GLUCOSE 103*  BUN 5*  CREATININE 0.47*  CALCIUM 8.8   Liver Function Tests: No results found for this basename: AST, ALT, ALKPHOS, BILITOT, PROT, ALBUMIN,  in the last 168 hours No results found for this basename: LIPASE, AMYLASE,  in the last 168 hours No results found for this basename: AMMONIA,  in the last 168 hours CBC:  Recent Labs Lab 07/20/14 0914  WBC 6.6  NEUTROABS 3.2  HGB 8.8*  HCT 23.9*  MCV 102.1*  PLT PLATELET CLUMPS NOTED ON SMEAR, COUNT APPEARS ADEQUATE   Cardiac Enzymes: No results found for this basename: CKTOTAL, CKMB, CKMBINDEX, TROPONINI,  in the last 168 hours BNP: No components found with this basename: POCBNP,  CBG: No results found for this basename: GLUCAP,  in the last 168 hours Ferritin: No results found for this basename: FERRITIN,  in the last 168 hours  Time coordinating discharge: 35 minutes  Signed:  [name],[name]  07/20/2014, 2:23 PM

## 2014-07-20 NOTE — H&P (Signed)
[place] History and Physical  Dawn [name] TOI:[id number] DOB: [birth year] DOA: 07/20/2014   PCP: [name],[name], MD   Chief Complaint: Pain in back and legs  X 1 day.   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 She states that pain was 9/10 and she took a dose MS Contin 20 mg which was left over from her recent discharge from the hospital. She states that her pain was 7/10 today and is now 6/10 after 2 doses of Dilaudid and 1 dose of Toradol. Pt describes pain as a constant throbbing and not associated with any other symptoms.    Review of Systems:  Constitutional: No weight loss, night sweats, Fevers, chills, fatigue.  HEENT: No headaches, dizziness, seizures, vision changes, difficulty swallowing,Tooth/dental problems,Sore throat, No sneezing, itching, ear ache, nasal congestion, post nasal drip,  Cardio-vascular: No chest pain, Orthopnea, PND, swelling in lower extremities, anasarca, dizziness, palpitations  GI: No heartburn, indigestion, abdominal pain, nausea, vomiting, diarrhea, change in bowel habits, loss of appetite  Resp: No shortness of breath with exertion or at rest. No excess mucus, no productive cough, No non-productive cough, No coughing up of blood.No change in color of mucus.No wheezing.No chest wall deformity  Skin: no rash or lesions.  GU: no dysuria, change in color of urine, no urgency or frequency. No flank pain.  Musculoskeletal: No joint pain or swelling. No decreased range of motion. No back pain.  Psych: No change in mood or affect. No depression or anxiety. No memory loss.    Past Medical History  Diagnosis Date  . Sickle cell disease   . Sickle cell disease, type S   . Blood transfusion   . 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 HCAP (healthcare-associated pneumonia) 05/19/2013   Past Surgical History  Procedure Laterality Date  .  Cholecystectomy    .  left knee acl reconstruction    . Cesarean section      x 2  . Portacath placement    . Portacath placement Left 02/23/2013    Procedure: INSERTION PORT-A-CATH;  Surgeon: Donato Heinz, MD;  Location: [facility];  Service: General;  Laterality: Left;  . Port-a-cath removal Left 07/29/2013    Procedure: REMOVAL PORT-A-CATH;  Surgeon: Scherry Ran, MD;  Location: [facility];  Service: General;  Laterality: Left;  . Tee without cardioversion N/A 08/07/2013    Procedure: TRANSESOPHAGEAL ECHOCARDIOGRAM (TEE);  Surgeon: Arnoldo Lenis, MD;  Location: [facility];  Service: Cardiology;  Laterality: N/A;   Social History:  reports that she has never smoked. She does not have any smokeless tobacco history on file. She reports that she does not drink alcohol or use illicit drugs.  Allergies  Allergen Reactions  . Desferal [Deferoxamine] Hives    Local reaction on arm only during infusion. Can take with benadryl   . Latex Other (See Comments)    REACTION: Pt experiences a burning sensation on contacted skin areas  . Lisinopril Other (See Comments) and Cough    REACTION: Sore/scratchy throat  . Tape Other (See Comments)    REACTION: Pt. Experiences a burning sensation on contacted skin areas    Family History  Problem Relation Age of Onset  . Sickle cell trait Mother   . Sickle cell trait Father   . Hypertension Father   . Diabetes Father   . Sickle cell trait Sister   .  Cancer Paternal Aunt     Breast    Prior to Admission medications   Medication Sig Start Date End Date Taking? Authorizing Provider  cetirizine (ZYRTEC) 10 MG tablet Take 10 mg by mouth daily.   Yes Historical Provider, MD  folic acid (FOLVITE) 1 MG tablet Take 1 tablet (1 mg total) by mouth daily. 03/31/14  Yes [name], FNP  hydroxyurea (HYDREA) 500 MG capsule Take 1-2 capsules (500-1,000 mg total) by mouth as directed. Take 500mg  (1 tablet) ON ODD DAYS AND 1000 MG (2) TABLETS ON THE EVEN  DAYS. 03/31/14  Yes [name], FNP  OxyCODONE (OXYCONTIN) 20 mg T12A 12 hr tablet Take 1 tablet (20 mg total) by mouth every 12 (twelve) hours. 06/24/14  Yes Lezlie Octave Black, NP  Oxycodone HCl 10 MG TABS Take 1 tablet (10 mg total) by mouth every 4 (four) hours as needed (pain). 06/24/14  Yes Lezlie Octave Black, NP  promethazine (PHENERGAN) 12.5 MG tablet Take 1 tablet (12.5 mg total) by mouth every 6 (six) hours as needed for nausea. 1-2 prn begin with 1 if no relief may repeat x's 1 05/24/14  Yes [name], FNP  Vitamin D, Ergocalciferol, (DRISDOL) 50000 UNITS CAPS capsule Take 1 capsule (50,000 Units total) by mouth every 7 (seven) days. Takes on Sundays. 03/31/14  Yes [name], FNP   Physical Exam: Filed Vitals:   07/20/14 0906 07/20/14 0934  BP: 132/75 118/66  Pulse: 85 83  Temp: 98 F (36.7 C)   TempSrc: Oral   Resp: 18   Weight: 153 lb 7 oz (69.6 kg)   SpO2: 98%    General: Alert, awake, oriented x3, in no acute distress.  HEENT: [place]/AT PEERL, EOMI Neck: Trachea midline,  no masses, no thyromegal,y no JVD, no carotid bruit OROPHARYNX:  Moist, No exudate/ erythema/lesions.  Heart: Regular rate and rhythm, without murmurs, rubs, gallops, PMI non-displaced, no heaves or thrills on palpation.  Lungs: Clear to auscultation, no wheezing or rhonchi noted. No increased vocal fremitus resonant to percussion  Abdomen: Soft, nontender, nondistended, positive bowel sounds, no masses no hepatosplenomegaly noted..  Neuro: No focal neurological deficits noted cranial nerves II through XII grossly intact.   Labs on Admission:   Basic Metabolic Panel: No results found for this basename: NA, K, CL, CO2, GLUCOSE, BUN, CREATININE, CALCIUM, MG, PHOS,  in the last 168 hours Liver Function Tests: No results found for this basename: AST, ALT, ALKPHOS, BILITOT, PROT, ALBUMIN,  in the last 168 hours CBC:  Recent Labs Lab 07/20/14 0914  WBC PENDING  NEUTROABS PENDING  HGB 8.8*  HCT 23.9*   MCV 102.1*  PLT PENDING   BNP: No components found with this basename: POCBNP,     Assessment/Plan: Active Problems:   1. Hb SS with crisis: Pt will be treated with initially treated with weight based rapid re-dosing IVF and Toradol . If pain is above 7/10 then Pt will be transitioned to a weight based Dilaudid PCA . Patient will be re-evaluated for pain in the context of function and relationship to baseline as care progresses.      Time spend: 35 minutes Code Status: Full Code Family Communication: N/A Disposition Plan: Not yet determined  [name],[name], MD  Pager [phone]  If 7PM-7AM, please contact night-coverage [email or web address] Password [email or web address] 07/20/2014, 10:25 AM

## 2014-07-20 NOTE — Progress Notes (Signed)
Patient ID: Dawn [name], female   DOB: [birth year], 33 y.o.   MRN: [id number]  Pt discharged home with friend. Pt a&ox3 at time of discharge. Pt reports manageable pain 4/10 on number pain scale. PIV discontinued, dry gauze and tape applied. Pt received discharged instructions, prescriptions and all belongings at time of discharge. Pt verbalized understanding. Pt denies any further needs at this time.

## 2014-07-28 ENCOUNTER — Encounter (HOSPITAL_COMMUNITY): Payer: Self-pay | Admitting: Emergency Medicine

## 2014-07-28 ENCOUNTER — Emergency Department (HOSPITAL_COMMUNITY)
Admission: EM | Admit: 2014-07-28 | Discharge: 2014-07-28 | Disposition: A | Discharge status: Home / Self Care | Payer: BC Managed Care – PPO | Attending: Emergency Medicine | Admitting: Emergency Medicine

## 2014-07-28 DIAGNOSIS — D5700 Hb-SS disease with crisis, unspecified: Secondary | ICD-10-CM | POA: Diagnosis not present

## 2014-07-28 DIAGNOSIS — Z8659 Personal history of other mental and behavioral disorders: Secondary | ICD-10-CM | POA: Diagnosis not present

## 2014-07-28 DIAGNOSIS — Z3202 Encounter for pregnancy test, result negative: Secondary | ICD-10-CM | POA: Insufficient documentation

## 2014-07-28 DIAGNOSIS — Z8701 Personal history of pneumonia (recurrent): Secondary | ICD-10-CM | POA: Insufficient documentation

## 2014-07-28 DIAGNOSIS — Z79899 Other long term (current) drug therapy: Secondary | ICD-10-CM | POA: Diagnosis not present

## 2014-07-28 DIAGNOSIS — Z91040 Latex allergy status: Secondary | ICD-10-CM | POA: Insufficient documentation

## 2014-07-28 LAB — CBC WITH DIFFERENTIAL/PLATELET
BAND NEUTROPHILS: 0 % (ref 0–10)
BASOS PCT: 1 % (ref 0–1)
BLASTS: 0 %
Basophils Absolute: 0.1 10*3/uL (ref 0.0–0.1)
Eosinophils Absolute: 0.1 10*3/uL (ref 0.0–0.7)
Eosinophils Relative: 1 % (ref 0–5)
HCT: 21.4 % — ABNORMAL LOW (ref 36.0–46.0)
HEMOGLOBIN: 7.9 g/dL — AB (ref 12.0–15.0)
LYMPHS ABS: 3.1 10*3/uL (ref 0.7–4.0)
Lymphocytes Relative: 38 % (ref 12–46)
MCH: 37.1 pg — AB (ref 26.0–34.0)
MCHC: 36.9 g/dL — AB (ref 30.0–36.0)
MCV: 100.5 fL — ABNORMAL HIGH (ref 78.0–100.0)
MYELOCYTES: 0 %
Metamyelocytes Relative: 0 %
Monocytes Absolute: 1.1 10*3/uL — ABNORMAL HIGH (ref 0.1–1.0)
Monocytes Relative: 13 % — ABNORMAL HIGH (ref 3–12)
NEUTROS ABS: 3.8 10*3/uL (ref 1.7–7.7)
NEUTROS PCT: 47 % (ref 43–77)
Platelets: 249 10*3/uL (ref 150–400)
Promyelocytes Absolute: 0 %
RBC: 2.13 MIL/uL — ABNORMAL LOW (ref 3.87–5.11)
RDW: 20.9 % — ABNORMAL HIGH (ref 11.5–15.5)
WBC: 8.2 10*3/uL (ref 4.0–10.5)
nRBC: 0 /100 WBC

## 2014-07-28 LAB — COMPREHENSIVE METABOLIC PANEL
ALK PHOS: 153 U/L — AB (ref 39–117)
ALT: 36 U/L — AB (ref 0–35)
AST: 87 U/L — ABNORMAL HIGH (ref 0–37)
Albumin: 3.2 g/dL — ABNORMAL LOW (ref 3.5–5.2)
Anion gap: 11 (ref 5–15)
BUN: 6 mg/dL (ref 6–23)
CO2: 23 mEq/L (ref 19–32)
Calcium: 8.9 mg/dL (ref 8.4–10.5)
Chloride: 106 mEq/L (ref 96–112)
Creatinine, Ser: 0.53 mg/dL (ref 0.50–1.10)
GFR calc Af Amer: >90 mL/min (ref >90)
GFR calc non Af Amer: >90 mL/min (ref >90)
GLUCOSE: 100 mg/dL — AB (ref 70–99)
POTASSIUM: 3.7 meq/L (ref 3.7–5.3)
Sodium: 140 mEq/L (ref 137–147)
Total Bilirubin: 6.9 mg/dL — ABNORMAL HIGH (ref 0.3–1.2)
Total Protein: 7.6 g/dL (ref 6.0–8.3)

## 2014-07-28 LAB — URINE MICROSCOPIC-ADD ON

## 2014-07-28 LAB — PREGNANCY, URINE: Preg Test, Ur: NEGATIVE

## 2014-07-28 LAB — URINALYSIS, ROUTINE W REFLEX MICROSCOPIC
Bilirubin Urine: NEGATIVE
Glucose, UA: NEGATIVE mg/dL
Ketones, ur: NEGATIVE mg/dL
Leukocytes, UA: NEGATIVE
Nitrite: NEGATIVE
Protein, ur: 30 mg/dL — AB
SPECIFIC GRAVITY, URINE: 1.01 (ref 1.005–1.030)
Urobilinogen, UA: 1 mg/dL (ref 0.0–1.0)
pH: 6 (ref 5.0–8.0)

## 2014-07-28 MED ORDER — HYDROMORPHONE HCL 1 MG/ML IJ SOLN
1.0000 mg | INTRAMUSCULAR | Status: AC | PRN
  Administered 2014-07-28 (×4): 1 mg via INTRAVENOUS
  Filled 2014-07-28 (×4): qty 1

## 2014-07-28 MED ORDER — ONDANSETRON HCL 4 MG/2ML IJ SOLN
4.0000 mg | Freq: Once | INTRAMUSCULAR | Status: AC
  Administered 2014-07-28: 4 mg via INTRAVENOUS
  Filled 2014-07-28: qty 2

## 2014-07-28 MED ORDER — SODIUM CHLORIDE 0.9 % IV SOLN
INTRAVENOUS | Status: DC
  Administered 2014-07-28: 18:00:00 via INTRAVENOUS

## 2014-07-28 MED ORDER — HYDROMORPHONE HCL 2 MG/ML IJ SOLN
2.0000 mg | Freq: Once | INTRAMUSCULAR | Status: AC
  Administered 2014-07-28: 2 mg via INTRAVENOUS
  Filled 2014-07-28: qty 1

## 2014-07-28 MED ORDER — DIPHENHYDRAMINE HCL 50 MG/ML IJ SOLN
12.5000 mg | Freq: Once | INTRAMUSCULAR | Status: AC
  Administered 2014-07-28: 12.5 mg via INTRAVENOUS
  Filled 2014-07-28: qty 1

## 2014-07-28 NOTE — ED Provider Notes (Signed)
CSN: [id number]     Arrival date & time 07/28/14  1631 History  This chart was scribe for No att. providers found by Judithann Sauger, ED Scribe. The patient was seen in room [facility]14/[facility]14 and the patient's care was started at 5:13 PM.    Chief Complaint  Patient presents with  . Pain   The history is provided by the patient. No language interpreter was used.   HPI Comments: Dawn [name] is a 33 y.o. female with a hx of hemoglobin ss who presents to the Emergency Department complaining of constant bilateral leg pain and right lower back pain onset 6 months when she started working. She states that she has also had ear pain onset 2 days ago. She reports that she took short acting oxycodone twice PTA and once yesterday with no relief.    Past Medical History  Diagnosis Date  . Sickle cell disease   . Sickle cell disease, type S   . Blood transfusion   . 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 HCAP (healthcare-associated pneumonia) 05/19/2013   Past Surgical History  Procedure Laterality Date  . Cholecystectomy    .  left knee acl reconstruction    . Cesarean section      x 2  . Portacath placement    . Portacath placement Left 02/23/2013    Procedure: INSERTION PORT-A-CATH;  Surgeon: Donato Heinz, MD;  Location: [facility];  Service: General;  Laterality: Left;  . Port-a-cath removal Left 07/29/2013    Procedure: REMOVAL PORT-A-CATH;  Surgeon: Scherry Ran, MD;  Location: [facility];  Service: General;  Laterality: Left;  . Tee without cardioversion N/A 08/07/2013    Procedure: TRANSESOPHAGEAL ECHOCARDIOGRAM (TEE);  Surgeon: Arnoldo Lenis, MD;  Location: [facility];  Service: Cardiology;  Laterality: N/A;   Family History  Problem Relation Age of Onset  . Sickle cell trait Mother   . Sickle cell trait Father   . Hypertension Father   . Diabetes Father   . Sickle cell trait Sister   . Cancer Paternal Aunt     Breast   History  Substance Use Topics  . Smoking status:  Never Smoker   . Smokeless tobacco: Not on file  . Alcohol Use: No   OB History    No data available     Review of Systems  HENT: Positive for ear pain.   Musculoskeletal: Positive for back pain.       Bilateral leg pain      Allergies  Desferal; Latex; Lisinopril; and Tape  Home Medications   Prior to Admission medications   Medication Sig Start Date End Date Taking? Authorizing Provider  cetirizine (ZYRTEC) 10 MG tablet Take 10 mg by mouth daily.   Yes Historical Provider, MD  folic acid (FOLVITE) 1 MG tablet Take 1 tablet (1 mg total) by mouth daily. 03/31/14  Yes [name], FNP  hydroxyurea (HYDREA) 500 MG capsule Take 1-2 capsules (500-1,000 mg total) by mouth as directed. Take 500mg  (1 tablet) ON ODD DAYS AND 1000 MG (2) TABLETS ON THE EVEN DAYS. 03/31/14  Yes [name], FNP  Oxycodone HCl 10 MG TABS Take 1 tablet (10 mg total) by mouth every 4 (four) hours as needed (pain). 07/20/14  Yes Leana Gamer, MD  promethazine (PHENERGAN) 12.5 MG tablet Take 1 tablet (12.5 mg total) by mouth every 6 (six) hours as needed for nausea. 1-2 prn begin with 1 if no relief may repeat x's 1  07/20/14  Yes Leana Gamer, MD  Vitamin D, Ergocalciferol, (DRISDOL) 50000 UNITS CAPS capsule Take 1 capsule (50,000 Units total) by mouth every 7 (seven) days. Takes on Sundays. 03/31/14   [name], FNP   BP 130/72 mmHg  Pulse 94  Temp(Src) 99.6 F (37.6 C) (Oral)  Resp 18  Ht 5\' 3"  (1.6 m)  Wt 153 lb (69.4 kg)  BMI 27.11 kg/m2  SpO2 91% Physical Exam  Constitutional: She is oriented to person, place, and time. She appears well-developed and well-nourished.  HENT:  Head: Normocephalic and atraumatic.  Right Ear: External ear normal.  Left Ear: External ear normal.  Eyes: Conjunctivae and EOM are normal. Pupils are equal, round, and reactive to light.  Scleral icterus are yellow  Neck: Normal range of motion and phonation normal. Neck supple.  Cardiovascular:  Normal rate and regular rhythm.   Pulmonary/Chest: Effort normal and breath sounds normal. She exhibits no tenderness.  Abdominal: Soft. She exhibits no distension. There is no tenderness. There is no guarding.  Musculoskeletal: Normal range of motion.  Both knees are tender without selling or deformity   Neurological: She is alert and oriented to person, place, and time. She exhibits normal muscle tone.  Skin: Skin is warm and dry.  Psychiatric: She has a normal mood and affect. Her behavior is normal. Judgment and thought content normal.  Nursing note and vitals reviewed.   ED Course  Procedures (including critical care time) 5:19 PM- Pt advised of plan for treatment and pt agrees.  Medications  0.9 %  sodium chloride infusion ( Intravenous Stopped 07/28/14 2038)  HYDROmorphone (DILAUDID) injection 1 mg (1 mg Intravenous Given 07/28/14 2004)  ondansetron (ZOFRAN) injection 4 mg (4 mg Intravenous Given 07/28/14 1748)  HYDROmorphone (DILAUDID) injection 2 mg (2 mg Intravenous Given 07/28/14 1748)  diphenhydrAMINE (BENADRYL) injection 12.5 mg (12.5 mg Intravenous Given 07/28/14 1817)    Patient Vitals for the past 24 hrs:  BP Temp Temp src Pulse Resp SpO2 Height Weight  07/28/14 1931 130/72 mmHg - - 94 18 91 % - -  07/28/14 1633 145/72 mmHg 99.6 F (37.6 C) Oral 104 19 94 % 5\' 3"  (1.6 m) 153 lb (69.4 kg)    8:00 PM Reevaluation with update and discussion. After initial assessment and treatment, an updated evaluation reveals Her pain is now 5 over 5, and she feels like she can go home, if she gets 1 more dose of Dilaudid.  This will be a total of 6 mg of Dilaudid, by rapid interval.  Findings discussed with patient, all questions answered. Dawn [name] L   Labs Review Labs Reviewed  COMPREHENSIVE METABOLIC PANEL - Abnormal; Notable for the following:    Glucose, Bld 100 (*)    Albumin 3.2 (*)    AST 87 (*)    ALT 36 (*)    Alkaline Phosphatase 153 (*)    Total Bilirubin 6.9 (*)     All other components within normal limits  CBC WITH DIFFERENTIAL - Abnormal; Notable for the following:    RBC 2.13 (*)    Hemoglobin 7.9 (*)    HCT 21.4 (*)    MCV 100.5 (*)    MCH 37.1 (*)    MCHC 36.9 (*)    RDW 20.9 (*)    Monocytes Relative 13 (*)    Monocytes Absolute 1.1 (*)    All other components within normal limits  URINALYSIS, ROUTINE W REFLEX MICROSCOPIC - Abnormal; Notable for the following:  Hgb urine dipstick MODERATE (*)    Protein, ur 30 (*)    All other components within normal limits  URINE MICROSCOPIC-ADD ON - Abnormal; Notable for the following:    Squamous Epithelial / LPF FEW (*)    Bacteria, UA FEW (*)    All other components within normal limits  URINE CULTURE  PREGNANCY, URINE    Imaging Review No results found.   EKG Interpretation None      MDM   Final diagnoses:  Sickle cell crisis    Sickle cell crisis with hemolysis and mild elevation of bilirubin, from her baseline.  Hemoglobin dropped 1 g, but she does not appear to need a transfusion, at this time. Pain controlled with treatment.  She is more comfortable and stable for discharge.  She has short-term follow-up scheduled for 9 AM tomorrow morning, at the sickle cell clinic in [place], [place].  Nursing Notes Reviewed/ Care Coordinated Applicable Imaging Reviewed Interpretation of Laboratory Data incorporated into ED treatment  The patient appears reasonably screened and/or stabilized for discharge and I doubt any other medical condition or other [facility] requiring further screening, evaluation, or treatment in the ED at this time prior to discharge.  Plan: Home Medications- usual; Home Treatments- rest; return here if the recommended treatment, does not improve the symptoms; Recommended follow up- [place] physician in AM as scheduled  I personally performed the services described in this documentation, which was scribed in my presence. The recorded information has been reviewed and is  accurate.    Richarda Blade, MD 07/28/14 2045

## 2014-07-28 NOTE — ED Notes (Signed)
Pt reports "pain crisis" since last night. Pt reports BLE pain and lower back pain.

## 2014-07-28 NOTE — Discharge Instructions (Signed)
Assessment doctor, to review the labs, especially your bilirubin, and hemoglobin.  She will likely want to repeat these tests, tomorrow. Return here, if needed, for problems.   Sickle Cell Anemia, Adult Sickle cell anemia is a condition in which red blood cells have an abnormal "sickle" shape. This abnormal shape shortens the cells' life span, which results in a lower than normal concentration of red blood cells in the blood. The sickle shape also causes the cells to clump together and block free blood flow through the blood vessels. As a result, the tissues and organs of the body do not receive enough oxygen. Sickle cell anemia causes organ damage and pain and increases the risk of infection. CAUSES  Sickle cell anemia is a genetic disorder. Those who receive two copies of the gene have the condition, and those who receive one copy have the trait. RISK FACTORS The sickle cell gene is most common in people whose families originated in [place]. Other areas of the globe where sickle cell trait occurs include the [place], [place] and [place], and the [place].  SIGNS AND SYMPTOMS  Pain, especially in the extremities, back, chest, or abdomen (common). The pain may start suddenly or may develop following an illness, especially if there is dehydration. Pain can also occur due to overexertion or exposure to extreme temperature changes.  Frequent severe bacterial infections, especially certain types of pneumonia and meningitis.  Pain and swelling in the hands and feet.  Decreased activity.   Loss of appetite.   Change in behavior.  Headaches.  Seizures.  Shortness of breath or difficulty breathing.  Vision changes.  Skin ulcers. Those with the trait may not have symptoms or they may have mild symptoms.  DIAGNOSIS  Sickle cell anemia is diagnosed with blood tests that demonstrate the genetic trait. It is often diagnosed during the newborn period, due to  mandatory testing nationwide. A variety of blood tests, X-rays, CT scans, MRI scans, ultrasounds, and lung function tests may also be done to monitor the condition. TREATMENT  Sickle cell anemia may be treated with:  Medicines. You may be given pain medicines, antibiotic medicines (to treat and prevent infections) or medicines to increase the production of certain types of hemoglobin.  Fluids.  Oxygen.  Blood transfusions. HOME CARE INSTRUCTIONS   Drink enough fluid to keep your urine clear or pale yellow. Increase your fluid intake in hot weather and during exercise.  Do not smoke. Smoking lowers oxygen levels in the blood.   Only take over-the-counter or prescription medicines for pain, fever, or discomfort as directed by your health care provider.  Take antibiotics as directed by your health care provider. Make sure you finish them it even if you start to feel better.   Take supplements as directed by your health care provider.   Consider wearing a medical alert bracelet. This tells anyone caring for you in an emergency of your condition.   When traveling, keep your medical information, health care provider's names, and the medicines you take with you at all times.   If you develop a fever, do not take medicines to reduce the fever right away. This could cover up a problem that is developing. Notify your health care provider.  Keep all follow-up appointments with your health care provider. Sickle cell anemia requires regular medical care. SEEK MEDICAL CARE IF: You have a fever. SEEK IMMEDIATE MEDICAL CARE IF:   You feel dizzy or faint.   You have new abdominal pain,  especially on the left side near the stomach area.   You develop a persistent, often uncomfortable and painful penile erection (priapism). If this is not treated immediately it will lead to impotence.   You have numbness your arms or legs or you have a hard time moving them.   You have a hard time  with speech.   You have a fever or persistent symptoms for more than 2-3 days.   You have a fever and your symptoms suddenly get worse.   You have signs or symptoms of infection. These include:   Chills.   Abnormal tiredness (lethargy).   Irritability.   Poor eating.   Vomiting.   You develop pain that is not helped with medicine.   You develop shortness of breath.  You have pain in your chest.   You are coughing up pus-like or bloody sputum.   You develop a stiff neck.  Your feet or hands swell or have pain.  Your abdomen appears bloated.  You develop joint pain. MAKE SURE YOU:  Understand these instructions. Document Released: 12/19/2005 Document Revised: 01/25/2014 Document Reviewed: 04/22/2013 [facility] Patient Information 2015 [place], [place]. This information is not intended to replace advice given to you by your health care provider. Make sure you discuss any questions you have with your health care provider.

## 2014-07-30 LAB — URINE CULTURE
COLONY COUNT: NO GROWTH
Culture: NO GROWTH

## 2014-09-02 ENCOUNTER — Other Ambulatory Visit: Payer: Self-pay | Admitting: Internal Medicine

## 2014-09-09 ENCOUNTER — Telehealth (HOSPITAL_COMMUNITY): Payer: Self-pay | Admitting: Hematology

## 2014-09-09 ENCOUNTER — Encounter (HOSPITAL_COMMUNITY): Payer: Self-pay

## 2014-09-09 ENCOUNTER — Non-Acute Institutional Stay (HOSPITAL_COMMUNITY)
Admission: AD | Admit: 2014-09-09 | Discharge: 2014-09-09 | Disposition: A | Discharge status: Home / Self Care | Payer: BC Managed Care – PPO | Source: Home / Self Care | Attending: Internal Medicine | Admitting: Internal Medicine

## 2014-09-09 DIAGNOSIS — D5700 Hb-SS disease with crisis, unspecified: Principal | ICD-10-CM | POA: Diagnosis present

## 2014-09-09 DIAGNOSIS — I10 Essential (primary) hypertension: Secondary | ICD-10-CM | POA: Diagnosis present

## 2014-09-09 DIAGNOSIS — D571 Sickle-cell disease without crisis: Secondary | ICD-10-CM

## 2014-09-09 DIAGNOSIS — R17 Unspecified jaundice: Secondary | ICD-10-CM | POA: Diagnosis present

## 2014-09-09 DIAGNOSIS — Z9049 Acquired absence of other specified parts of digestive tract: Secondary | ICD-10-CM | POA: Diagnosis present

## 2014-09-09 DIAGNOSIS — R079 Chest pain, unspecified: Secondary | ICD-10-CM | POA: Diagnosis not present

## 2014-09-09 DIAGNOSIS — R7989 Other specified abnormal findings of blood chemistry: Secondary | ICD-10-CM | POA: Diagnosis present

## 2014-09-09 DIAGNOSIS — Z833 Family history of diabetes mellitus: Secondary | ICD-10-CM

## 2014-09-09 DIAGNOSIS — E559 Vitamin D deficiency, unspecified: Secondary | ICD-10-CM | POA: Diagnosis present

## 2014-09-09 DIAGNOSIS — J9601 Acute respiratory failure with hypoxia: Secondary | ICD-10-CM | POA: Diagnosis present

## 2014-09-09 DIAGNOSIS — R0602 Shortness of breath: Secondary | ICD-10-CM | POA: Diagnosis not present

## 2014-09-09 DIAGNOSIS — I517 Cardiomegaly: Secondary | ICD-10-CM | POA: Diagnosis not present

## 2014-09-09 DIAGNOSIS — Z8249 Family history of ischemic heart disease and other diseases of the circulatory system: Secondary | ICD-10-CM

## 2014-09-09 DIAGNOSIS — Z832 Family history of diseases of the blood and blood-forming organs and certain disorders involving the immune mechanism: Secondary | ICD-10-CM

## 2014-09-09 LAB — CBC WITH DIFFERENTIAL/PLATELET
Basophils Absolute: 0 10*3/uL (ref 0.0–0.1)
Basophils Relative: 0 % (ref 0–1)
Eosinophils Absolute: 0.1 10*3/uL (ref 0.0–0.7)
Eosinophils Relative: 1 % (ref 0–5)
HEMATOCRIT: 20.7 % — AB (ref 36.0–46.0)
HEMOGLOBIN: 7.6 g/dL — AB (ref 12.0–15.0)
Lymphocytes Relative: 37 % (ref 12–46)
Lymphs Abs: 2.9 10*3/uL (ref 0.7–4.0)
MCH: 37.4 pg — ABNORMAL HIGH (ref 26.0–34.0)
MCHC: 36.7 g/dL — ABNORMAL HIGH (ref 30.0–36.0)
MCV: 102 fL — AB (ref 78.0–100.0)
MONOS PCT: 14 % — AB (ref 3–12)
Monocytes Absolute: 1.1 10*3/uL — ABNORMAL HIGH (ref 0.1–1.0)
NEUTROS ABS: 3.8 10*3/uL (ref 1.7–7.7)
Neutrophils Relative %: 48 % (ref 43–77)
PLATELETS: 215 10*3/uL (ref 150–400)
RBC: 2.03 MIL/uL — ABNORMAL LOW (ref 3.87–5.11)
RDW: 20.2 % — ABNORMAL HIGH (ref 11.5–15.5)
WBC: 7.9 10*3/uL (ref 4.0–10.5)

## 2014-09-09 LAB — COMPREHENSIVE METABOLIC PANEL
ALBUMIN: 3 g/dL — AB (ref 3.5–5.2)
ALT: 28 U/L (ref 0–35)
AST: 70 U/L — AB (ref 0–37)
Alkaline Phosphatase: 156 U/L — ABNORMAL HIGH (ref 39–117)
Anion gap: 10 (ref 5–15)
BUN: 4 mg/dL — ABNORMAL LOW (ref 6–23)
CALCIUM: 8.8 mg/dL (ref 8.4–10.5)
CO2: 25 mEq/L (ref 19–32)
Chloride: 102 mEq/L (ref 96–112)
Creatinine, Ser: 0.6 mg/dL (ref 0.50–1.10)
GFR calc Af Amer: >90 mL/min (ref >90)
GFR calc non Af Amer: >90 mL/min (ref >90)
Glucose, Bld: 115 mg/dL — ABNORMAL HIGH (ref 70–99)
Potassium: 3.7 mEq/L (ref 3.7–5.3)
SODIUM: 137 meq/L (ref 137–147)
TOTAL PROTEIN: 7.6 g/dL (ref 6.0–8.3)
Total Bilirubin: 7.9 mg/dL — ABNORMAL HIGH (ref 0.3–1.2)

## 2014-09-09 LAB — LACTATE DEHYDROGENASE: LDH: 408 U/L — AB (ref 94–250)

## 2014-09-09 MED ORDER — SODIUM CHLORIDE 0.9 % IJ SOLN: 9.0000 mL | INTRAMUSCULAR | Status: DC | PRN

## 2014-09-09 MED ORDER — POLYETHYLENE GLYCOL 3350 17 G PO PACK
17.0000 g | PACK | Freq: Every day | ORAL | Status: DC | PRN
  Filled 2014-09-09: qty 1

## 2014-09-09 MED ORDER — ONDANSETRON HCL 4 MG/2ML IJ SOLN
4.0000 mg | Freq: Four times a day (QID) | INTRAMUSCULAR | Status: DC | PRN
Start: 2014-09-09 — End: 2014-09-09
  Administered 2014-09-09: 4 mg via INTRAVENOUS
  Filled 2014-09-09: qty 2

## 2014-09-09 MED ORDER — HYDROMORPHONE HCL 2 MG/ML IJ SOLN: 2.0000 mg | Freq: Once | INTRAMUSCULAR | Status: DC

## 2014-09-09 MED ORDER — DIPHENHYDRAMINE HCL 12.5 MG/5ML PO ELIX
12.5000 mg | ORAL_SOLUTION | Freq: Four times a day (QID) | ORAL | Status: DC | PRN
  Administered 2014-09-09: 12.5 mg via ORAL
  Filled 2014-09-09: qty 5

## 2014-09-09 MED ORDER — NALOXONE HCL 0.4 MG/ML IJ SOLN: 0.4000 mg | INTRAMUSCULAR | Status: DC | PRN

## 2014-09-09 MED ORDER — FOLIC ACID 1 MG PO TABS: 1.0000 mg | ORAL_TABLET | Freq: Every day | ORAL | Status: DC

## 2014-09-09 MED ORDER — CYCLOBENZAPRINE HCL 10 MG PO TABS
10.0000 mg | ORAL_TABLET | Freq: Once | ORAL | Status: AC
  Administered 2014-09-09: 10 mg via ORAL
  Filled 2014-09-09: qty 1

## 2014-09-09 MED ORDER — KETOROLAC TROMETHAMINE 30 MG/ML IJ SOLN
30.0000 mg | Freq: Four times a day (QID) | INTRAMUSCULAR | Status: DC
  Administered 2014-09-09: 30 mg via INTRAVENOUS
  Filled 2014-09-09: qty 1

## 2014-09-09 MED ORDER — HYDROMORPHONE 2 MG/ML HIGH CONCENTRATION IV PCA SOLN
INTRAVENOUS | Status: DC
  Administered 2014-09-09: 5 mg via INTRAVENOUS
  Administered 2014-09-09: 11:00:00 via INTRAVENOUS
  Filled 2014-09-09: qty 25

## 2014-09-09 MED ORDER — DEXTROSE-NACL 5-0.45 % IV SOLN
INTRAVENOUS | Status: DC
  Administered 2014-09-09: 11:00:00 via INTRAVENOUS

## 2014-09-09 MED ORDER — SENNOSIDES-DOCUSATE SODIUM 8.6-50 MG PO TABS: 1.0000 | ORAL_TABLET | Freq: Two times a day (BID) | ORAL | Status: DC

## 2014-09-09 MED ORDER — SODIUM CHLORIDE 0.9 % IV SOLN
12.5000 mg | Freq: Four times a day (QID) | INTRAVENOUS | Status: DC | PRN
  Filled 2014-09-09: qty 0.25

## 2014-09-09 NOTE — Progress Notes (Signed)
Notified Dr.Garba that patient states pain is controlled - level at a 4.

## 2014-09-09 NOTE — Progress Notes (Signed)
Pt discharged to home; pt states that she should be able to control her pain at home; pain level 4/10; discharge instructions given and signed; all questions answered; pt alert, oriented, and ambulatory; no complications noted

## 2014-09-09 NOTE — Telephone Encounter (Signed)
Spoke with Dr. Jonelle Sidle, and it is ok for patient to come to [facility].  Patient verbalizes understanding.

## 2014-09-09 NOTE — Discharge Summary (Signed)
Physician Discharge Summary  Patient ID: Dawn [name] MRN: [id number] DOB/AGE: 33/15/1982 33 y.o.  Admit date: 09/09/2014 Discharge date: 09/09/2014  Admission Diagnoses:  Discharge Diagnoses:  Active Problems:   Sickle cell anemia with crisis   Sickle cell disease with crisis   Discharged Condition: good  Hospital Course: [name] admitted to the sickle cell day hospital was complaining of lower back pain and legs. She was diagnosed with sickle cell painful crisis. Patient received Dilaudid PCA with Toradol and IV hydration. Within 6 hours her pain level dropped from 8 out of 10-4 out of 10. She feels much better and was able to walk out of the unit on her own. She is to follow-up with primary care physician as scheduled. Patient feels she is more functional now onwards continue with her home therapy.  Consults: None  Significant Diagnostic Studies: labs: CBC is CMP is an reticulocyte count. #2 be within her normal range  Treatments: IV hydration and analgesia: Dilaudid  Discharge Exam: Blood pressure 122/63, pulse 83, temperature 98.2 F (36.8 C), temperature source Oral, resp. rate 12, weight 68.947 kg (152 lb), SpO2 96 %. General appearance: alert, cooperative and no distress Eyes: conjunctivae/corneas clear. PERRL, EOM's intact. Fundi benign. Neck: no adenopathy, no carotid bruit, no JVD, supple, symmetrical, trachea midline and thyroid not enlarged, symmetric, no tenderness/mass/nodules Back: symmetric, no curvature. ROM normal. No CVA tenderness. Resp: clear to auscultation bilaterally Chest wall: no tenderness Cardio: regular rate and rhythm, S1, S2 normal, no murmur, click, rub or gallop GI: soft, non-tender; bowel sounds normal; no masses,  no organomegaly Extremities: extremities normal, atraumatic, no cyanosis or edema Pulses: 2+ and symmetric Skin: Skin color, texture, turgor normal. No rashes or lesions Neurologic: Grossly normal  Disposition: 01-Home  or Self Care     Medication List    TAKE these medications        cetirizine 10 MG tablet  Commonly known as:  ZYRTEC  Take 10 mg by mouth daily.     folic acid 1 MG tablet  Commonly known as:  FOLVITE  Take 1 tablet (1 mg total) by mouth daily.     hydroxyurea 500 MG capsule  Commonly known as:  HYDREA  TAKE 1 CAPSULE ON ODD DAYS AND 2 CAPSULES ON EVEN DAYS     Oxycodone HCl 10 MG Tabs  Take 1 tablet (10 mg total) by mouth every 4 (four) hours as needed (pain).     promethazine 12.5 MG tablet  Commonly known as:  PHENERGAN  Take 1 tablet (12.5 mg total) by mouth every 6 (six) hours as needed for nausea. 1-2 prn begin with 1 if no relief may repeat x's 1     Vitamin D (Ergocalciferol) 50000 UNITS Caps capsule  Commonly known as:  DRISDOL  Take 1 capsule (50,000 Units total) by mouth every 7 (seven) days. Takes on Sundays.         SignedBarbette Merino 09/09/2014, 4:28 PM

## 2014-09-09 NOTE — Progress Notes (Signed)
Patient complains of back spasms. Notified MD - order for 10 mg flexeril.

## 2014-09-09 NOTE — H&P (Signed)
Dawn [name] is an 33 y.o. female.   Chief Complaint: Pain in back and legs HPI: [name] was known history of sickle cell disease though reports to the sickle cell day hospital with complaint of low back pain and leg pain for 2 days. She has taken all her home medication but not getting relief. Pain is rated as 8 out of 10 consistently was her typical sickle cell pain. Worsened with movement or any activity and not relieved by taking her medications. Denied any fever cough shortness of breath. No nausea vomiting or diarrhea.  Past Medical History  Diagnosis Date  . Sickle cell disease   . Sickle cell disease, type S   . Blood transfusion   . 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 HCAP (healthcare-associated pneumonia) 05/19/2013    Past Surgical History  Procedure Laterality Date  . Cholecystectomy    .  left knee acl reconstruction    . Cesarean section      x 2  . Portacath placement    . Portacath placement Left 02/23/2013    Procedure: INSERTION PORT-A-CATH;  Surgeon: Donato Heinz, MD;  Location: [facility];  Service: General;  Laterality: Left;  . Port-a-cath removal Left 07/29/2013    Procedure: REMOVAL PORT-A-CATH;  Surgeon: Scherry Ran, MD;  Location: [facility];  Service: General;  Laterality: Left;  . Tee without cardioversion N/A 08/07/2013    Procedure: TRANSESOPHAGEAL ECHOCARDIOGRAM (TEE);  Surgeon: Arnoldo Lenis, MD;  Location: [facility];  Service: Cardiology;  Laterality: N/A;    Family History  Problem Relation Age of Onset  . Sickle cell trait Mother   . Sickle cell trait Father   . Hypertension Father   . Diabetes Father   . Sickle cell trait Sister   . Cancer Paternal Aunt     Breast   Social History:  reports that she has never smoked. She does not have any smokeless tobacco history on file. She reports that she does not drink alcohol or use illicit drugs.  Allergies:  Allergies  Allergen Reactions  . Desferal [Deferoxamine] Hives     Local reaction on arm only during infusion. Can take with benadryl   . Latex Other (See Comments)    REACTION: Pt experiences a burning sensation on contacted skin areas  . Lisinopril Other (See Comments) and Cough    REACTION: Sore/scratchy throat  . Tape Other (See Comments)    REACTION: Pt. Experiences a burning sensation on contacted skin areas    Medications Prior to Admission  Medication Sig Dispense Refill  . cetirizine (ZYRTEC) 10 MG tablet Take 10 mg by mouth daily.    . folic acid (FOLVITE) 1 MG tablet Take 1 tablet (1 mg total) by mouth daily. 30 tablet 11  . hydroxyurea (HYDREA) 500 MG capsule TAKE 1 CAPSULE ON ODD DAYS AND 2 CAPSULES ON EVEN DAYS 45 capsule 2  . Oxycodone HCl 10 MG TABS Take 1 tablet (10 mg total) by mouth every 4 (four) hours as needed (pain). 90 tablet 0  . promethazine (PHENERGAN) 12.5 MG tablet Take 1 tablet (12.5 mg total) by mouth every 6 (six) hours as needed for nausea. 1-2 prn begin with 1 if no relief may repeat x's 1 30 tablet 0  . Vitamin D, Ergocalciferol, (DRISDOL) 50000 UNITS CAPS capsule Take 1 capsule (50,000 Units total) by mouth every 7 (seven) days. Takes on Sundays. 30 capsule 11    No results found for this or any  previous visit (from the past 48 hour(s)). No results found.  Review of Systems  Constitutional: Negative.   Eyes: Negative.   Respiratory: Negative.   Cardiovascular: Negative.   Gastrointestinal: Negative.   Genitourinary: Negative.   Musculoskeletal: Positive for myalgias, joint pain and neck pain. Negative for falls.  Skin: Negative.   Neurological: Negative.   Endo/Heme/Allergies: Negative.   Psychiatric/Behavioral: Negative.     Blood pressure 138/71, pulse 92, temperature 98.7 F (37.1 C), temperature source Oral, resp. rate 18, weight 68.947 kg (152 lb), SpO2 93 %. Physical Exam  Constitutional: She is oriented to person, place, and time. She appears well-developed and well-nourished.  HENT:  Head:  Normocephalic and atraumatic.  Right Ear: External ear normal.  Left Ear: External ear normal.  Eyes: Conjunctivae and EOM are normal. Pupils are equal, round, and reactive to light.  Neck: Normal range of motion. Neck supple.  Cardiovascular: Normal rate, regular rhythm, normal heart sounds and intact distal pulses.   Respiratory: Effort normal and breath sounds normal.  GI: Soft. Bowel sounds are normal.  Musculoskeletal: Normal range of motion.  Neurological: She is alert and oriented to person, place, and time. She has normal reflexes.  Skin: Skin is warm and dry.     Assessment/Plan [name] admitted to sickle cell day hospital.  #1 sickle cell painful crisis: Patient will be admitted and started on Dilaudid PCA, IV fluids as well as Toradol. We will monitor her response to the day. If she responds well we will discharge him home later.  #2 sickle cell anemia: I will check hemoglobin reticulocyte count as well as monitor through the day. If transfusion is needed we will transfuse.  #3 dehydration: We will hydrate the patient in the unit.  Krisanne Lich,LAWAL 09/09/2014, 9:48 AM

## 2014-09-09 NOTE — Telephone Encounter (Signed)
Patient C/O pain to back/legs 7/10 with no improvement from taking oxycodone.  Patient denies N/V/D, chest pain, abdominal pain, or shortness of breath.  I explained that I would notify the physician and give her a call back.  Patient verbalizes understanding.

## 2014-09-10 ENCOUNTER — Emergency Department (HOSPITAL_COMMUNITY): Payer: BC Managed Care – PPO

## 2014-09-10 ENCOUNTER — Encounter (HOSPITAL_COMMUNITY): Payer: Self-pay | Admitting: Cardiology

## 2014-09-10 ENCOUNTER — Inpatient Hospital Stay (HOSPITAL_COMMUNITY)
Admission: EM | Admit: 2014-09-10 | Discharge: 2014-09-13 | DRG: 811 | Disposition: A | Discharge status: Home / Self Care | Payer: BC Managed Care – PPO | Attending: Family Medicine | Admitting: Family Medicine

## 2014-09-10 DIAGNOSIS — R0602 Shortness of breath: Secondary | ICD-10-CM | POA: Diagnosis not present

## 2014-09-10 DIAGNOSIS — Z833 Family history of diabetes mellitus: Secondary | ICD-10-CM | POA: Diagnosis not present

## 2014-09-10 DIAGNOSIS — R079 Chest pain, unspecified: Secondary | ICD-10-CM

## 2014-09-10 DIAGNOSIS — Z9049 Acquired absence of other specified parts of digestive tract: Secondary | ICD-10-CM | POA: Diagnosis present

## 2014-09-10 DIAGNOSIS — R7989 Other specified abnormal findings of blood chemistry: Secondary | ICD-10-CM | POA: Diagnosis present

## 2014-09-10 DIAGNOSIS — D571 Sickle-cell disease without crisis: Secondary | ICD-10-CM

## 2014-09-10 DIAGNOSIS — Z832 Family history of diseases of the blood and blood-forming organs and certain disorders involving the immune mechanism: Secondary | ICD-10-CM | POA: Diagnosis not present

## 2014-09-10 DIAGNOSIS — Z8249 Family history of ischemic heart disease and other diseases of the circulatory system: Secondary | ICD-10-CM | POA: Diagnosis not present

## 2014-09-10 DIAGNOSIS — J9601 Acute respiratory failure with hypoxia: Secondary | ICD-10-CM | POA: Diagnosis not present

## 2014-09-10 DIAGNOSIS — I517 Cardiomegaly: Secondary | ICD-10-CM | POA: Diagnosis not present

## 2014-09-10 DIAGNOSIS — D5700 Hb-SS disease with crisis, unspecified: Secondary | ICD-10-CM | POA: Diagnosis not present

## 2014-09-10 DIAGNOSIS — R945 Abnormal results of liver function studies: Secondary | ICD-10-CM

## 2014-09-10 DIAGNOSIS — I10 Essential (primary) hypertension: Secondary | ICD-10-CM | POA: Diagnosis not present

## 2014-09-10 DIAGNOSIS — E559 Vitamin D deficiency, unspecified: Secondary | ICD-10-CM | POA: Diagnosis present

## 2014-09-10 DIAGNOSIS — R0902 Hypoxemia: Secondary | ICD-10-CM

## 2014-09-10 DIAGNOSIS — R17 Unspecified jaundice: Secondary | ICD-10-CM | POA: Diagnosis present

## 2014-09-10 HISTORY — DX: Sickle-cell disease without crisis: D57.1

## 2014-09-10 LAB — COMPREHENSIVE METABOLIC PANEL
ALT: 28 U/L (ref 0–35)
ANION GAP: 12 (ref 5–15)
AST: 73 U/L — AB (ref 0–37)
Albumin: 3 g/dL — ABNORMAL LOW (ref 3.5–5.2)
Alkaline Phosphatase: 150 U/L — ABNORMAL HIGH (ref 39–117)
BILIRUBIN TOTAL: 9.6 mg/dL — AB (ref 0.3–1.2)
BUN: 7 mg/dL (ref 6–23)
CALCIUM: 8.5 mg/dL (ref 8.4–10.5)
CHLORIDE: 104 meq/L (ref 96–112)
CO2: 23 meq/L (ref 19–32)
CREATININE: 0.56 mg/dL (ref 0.50–1.10)
Glucose, Bld: 88 mg/dL (ref 70–99)
Potassium: 3.9 mEq/L (ref 3.7–5.3)
Sodium: 139 mEq/L (ref 137–147)
Total Protein: 7.3 g/dL (ref 6.0–8.3)

## 2014-09-10 LAB — CBC WITH DIFFERENTIAL/PLATELET
Basophils Absolute: 0 10*3/uL (ref 0.0–0.1)
Basophils Relative: 0 % (ref 0–1)
Eosinophils Absolute: 0.1 10*3/uL (ref 0.0–0.7)
Eosinophils Relative: 0 % (ref 0–5)
HCT: 20.9 % — ABNORMAL LOW (ref 36.0–46.0)
Hemoglobin: 7.7 g/dL — ABNORMAL LOW (ref 12.0–15.0)
LYMPHS ABS: 2.4 10*3/uL (ref 0.7–4.0)
LYMPHS PCT: 19 % (ref 12–46)
MCH: 37.6 pg — ABNORMAL HIGH (ref 26.0–34.0)
MCHC: 36.8 g/dL — ABNORMAL HIGH (ref 30.0–36.0)
MCV: 102 fL — ABNORMAL HIGH (ref 78.0–100.0)
Monocytes Absolute: 2 10*3/uL — ABNORMAL HIGH (ref 0.1–1.0)
Monocytes Relative: 16 % — ABNORMAL HIGH (ref 3–12)
NEUTROS ABS: 7.8 10*3/uL — AB (ref 1.7–7.7)
NEUTROS PCT: 64 % (ref 43–77)
Platelets: 221 10*3/uL (ref 150–400)
RBC: 2.05 MIL/uL — AB (ref 3.87–5.11)
RDW: 22.3 % — AB (ref 11.5–15.5)
WBC: 12.2 10*3/uL — AB (ref 4.0–10.5)

## 2014-09-10 LAB — D-DIMER, QUANTITATIVE (NOT AT ARMC): D DIMER QUANT: 3.37 ug{FEU}/mL — AB (ref 0.00–0.48)

## 2014-09-10 LAB — RETICULOCYTES: RBC.: 2.05 MIL/uL — ABNORMAL LOW (ref 3.87–5.11)

## 2014-09-10 MED ORDER — SODIUM CHLORIDE 0.9 % IJ SOLN: 9.0000 mL | INTRAMUSCULAR | Status: DC | PRN

## 2014-09-10 MED ORDER — PROMETHAZINE HCL 25 MG/ML IJ SOLN
12.5000 mg | Freq: Once | INTRAMUSCULAR | Status: AC
  Administered 2014-09-10: 12.5 mg via INTRAVENOUS
  Filled 2014-09-10: qty 1

## 2014-09-10 MED ORDER — OXYCODONE HCL 5 MG PO TABS
10.0000 mg | ORAL_TABLET | ORAL | Status: DC | PRN
  Administered 2014-09-10 – 2014-09-11 (×3): 10 mg via ORAL
  Filled 2014-09-10 (×3): qty 2

## 2014-09-10 MED ORDER — DIPHENHYDRAMINE HCL 25 MG PO CAPS
25.0000 mg | ORAL_CAPSULE | Freq: Once | ORAL | Status: AC
  Administered 2014-09-11: 25 mg via ORAL
  Filled 2014-09-10: qty 1

## 2014-09-10 MED ORDER — SENNOSIDES-DOCUSATE SODIUM 8.6-50 MG PO TABS
1.0000 | ORAL_TABLET | Freq: Every evening | ORAL | Status: DC | PRN
  Administered 2014-09-12: 1 via ORAL
  Filled 2014-09-10: qty 1

## 2014-09-10 MED ORDER — SODIUM CHLORIDE 0.9 % IV SOLN
INTRAVENOUS | Status: DC
  Administered 2014-09-10 – 2014-09-12 (×6): via INTRAVENOUS

## 2014-09-10 MED ORDER — PROMETHAZINE HCL 12.5 MG PO TABS
12.5000 mg | ORAL_TABLET | Freq: Four times a day (QID) | ORAL | Status: DC | PRN
  Administered 2014-09-10: 12.5 mg via ORAL
  Filled 2014-09-10: qty 1

## 2014-09-10 MED ORDER — HYDROMORPHONE HCL 2 MG/ML IJ SOLN
2.0000 mg | Freq: Once | INTRAMUSCULAR | Status: AC
  Administered 2014-09-10: 2 mg via INTRAVENOUS

## 2014-09-10 MED ORDER — SODIUM CHLORIDE 0.9 % IV BOLUS (SEPSIS)
1000.0000 mL | Freq: Once | INTRAVENOUS | Status: AC
  Administered 2014-09-10: 1000 mL via INTRAVENOUS

## 2014-09-10 MED ORDER — LORATADINE 10 MG PO TABS
10.0000 mg | ORAL_TABLET | Freq: Every day | ORAL | Status: DC
Start: 2014-09-10 — End: 2014-09-13
  Administered 2014-09-11 – 2014-09-13 (×3): 10 mg via ORAL
  Filled 2014-09-10 (×3): qty 1

## 2014-09-10 MED ORDER — HYDROMORPHONE HCL 2 MG/ML IJ SOLN
2.0000 mg | Freq: Once | INTRAMUSCULAR | Status: AC
  Administered 2014-09-10: 2 mg via INTRAVENOUS
  Filled 2014-09-10: qty 1

## 2014-09-10 MED ORDER — FOLIC ACID 1 MG PO TABS
1.0000 mg | ORAL_TABLET | Freq: Every day | ORAL | Status: DC
  Administered 2014-09-11 – 2014-09-13 (×3): 1 mg via ORAL
  Filled 2014-09-10 (×3): qty 1

## 2014-09-10 MED ORDER — HYDROMORPHONE HCL 1 MG/ML IJ SOLN
1.0000 mg | Freq: Once | INTRAMUSCULAR | Status: AC
Start: 2014-09-10 — End: 2014-09-10
  Administered 2014-09-10: 1 mg via INTRAVENOUS
  Filled 2014-09-10: qty 1

## 2014-09-10 MED ORDER — HEPARIN SODIUM (PORCINE) 5000 UNIT/ML IJ SOLN
5000.0000 [IU] | Freq: Three times a day (TID) | INTRAMUSCULAR | Status: DC
  Filled 2014-09-10 (×3): qty 1

## 2014-09-10 MED ORDER — ONDANSETRON HCL 4 MG/2ML IJ SOLN: 4.0000 mg | Freq: Four times a day (QID) | INTRAMUSCULAR | Status: DC | PRN

## 2014-09-10 MED ORDER — TECHNETIUM TO 99M ALBUMIN AGGREGATED
6.0000 | Freq: Once | INTRAVENOUS | Status: AC | PRN
  Administered 2014-09-10: 6 via INTRAVENOUS

## 2014-09-10 MED ORDER — VITAMIN D (ERGOCALCIFEROL) 1.25 MG (50000 UNIT) PO CAPS
50000.0000 [IU] | ORAL_CAPSULE | ORAL | Status: DC
  Administered 2014-09-12: 50000 [IU] via ORAL
  Filled 2014-09-10: qty 1

## 2014-09-10 MED ORDER — TECHNETIUM TC 99M DIETHYLENETRIAME-PENTAACETIC ACID
40.0000 | Freq: Once | INTRAVENOUS | Status: AC | PRN
  Administered 2014-09-10: 40 via INTRAVENOUS

## 2014-09-10 MED ORDER — HYDROMORPHONE 0.3 MG/ML IV SOLN
INTRAVENOUS | Status: DC
  Administered 2014-09-10: 3.3 mg via INTRAVENOUS
  Administered 2014-09-10 – 2014-09-11 (×2): via INTRAVENOUS
  Administered 2014-09-11: 2.1 mg via INTRAVENOUS
  Administered 2014-09-11 – 2014-09-12 (×2): 1.2 mg via INTRAVENOUS
  Administered 2014-09-12: 2.1 mg via INTRAVENOUS
  Administered 2014-09-12: 1.2 mg via INTRAVENOUS
  Administered 2014-09-12 (×2): via INTRAVENOUS
  Administered 2014-09-13: 2.1 mg via INTRAVENOUS
  Administered 2014-09-13: 3.6 mg via INTRAVENOUS
  Filled 2014-09-10 (×6): qty 25

## 2014-09-10 MED ORDER — SODIUM CHLORIDE 0.9 % IV SOLN: INTRAVENOUS | Status: DC

## 2014-09-10 MED ORDER — HYDROMORPHONE HCL 2 MG/ML IJ SOLN
INTRAMUSCULAR | Status: AC
  Filled 2014-09-10: qty 1

## 2014-09-10 MED ORDER — HYDROMORPHONE HCL 1 MG/ML IJ SOLN
1.0000 mg | Freq: Once | INTRAMUSCULAR | Status: AC
  Administered 2014-09-10: 1 mg via INTRAVENOUS
  Filled 2014-09-10: qty 1

## 2014-09-10 MED ORDER — HYDROMORPHONE HCL 1 MG/ML IJ SOLN
1.0000 mg | INTRAMUSCULAR | Status: DC | PRN
  Administered 2014-09-10 – 2014-09-13 (×17): 1 mg via INTRAVENOUS
  Filled 2014-09-10 (×17): qty 1

## 2014-09-10 MED ORDER — HYDROXYUREA 500 MG PO CAPS: 500.0000 mg | ORAL_CAPSULE | Freq: Every day | ORAL | Status: DC

## 2014-09-10 MED ORDER — ONDANSETRON HCL 4 MG PO TABS: 4.0000 mg | ORAL_TABLET | Freq: Four times a day (QID) | ORAL | Status: DC | PRN

## 2014-09-10 MED ORDER — NALOXONE HCL 0.4 MG/ML IJ SOLN: 0.4000 mg | INTRAMUSCULAR | Status: DC | PRN

## 2014-09-10 MED ORDER — CETYLPYRIDINIUM CHLORIDE 0.05 % MT LIQD
7.0000 mL | Freq: Two times a day (BID) | OROMUCOSAL | Status: DC
  Administered 2014-09-10 – 2014-09-12 (×5): 7 mL via OROMUCOSAL

## 2014-09-10 NOTE — H&P (Signed)
[facility] History and Physical  Offie Pickron Patmon EYC:[id number] DOB: 04-09-1981 DOA: 09/10/2014  Referring physician: ER PCP: [name],[name], MD   Chief Complaint: Pain in chest arm and knees.  HPI: Dawn [name] is a 33 y.o. female  This is a 33 year old lady, well-known to this hospital, who presents with what appears to be a sickle cell crisis. She describes a deep aching bone pain consistent with prior flares involving her right chest, lower back, right arm and bilateral knees. 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 She has been trying to manage the pain at home by herself. She also developed some shortness of breath. She denies any recent cough, wheezing or fevers. She was at the sickle cell clinic yesterday and her pain was able to be better controlled but not completely pain-free. Since she has been treated in the emergency room, she is somewhat better but still not pain-free. She denies any hemoptysis, chest pain other than described above, palpitations or limb weakness. There is no alteration in mental state. She denies fever.   Review of Systems:  Apart from symptoms above, all other systems are negative.   Past Medical History  Diagnosis Date  . Sickle cell disease   . Sickle cell disease, type S   . Blood transfusion   . 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 HCAP (healthcare-associated pneumonia) 05/19/2013  . Sickle cell anemia    Past Surgical History  Procedure Laterality Date  . Cholecystectomy    .  left knee acl reconstruction    . Cesarean section      x 2  . Portacath placement    . Portacath placement Left 02/23/2013    Procedure: INSERTION PORT-A-CATH;  Surgeon: Donato Heinz, MD;  Location: [facility];  Service: General;  Laterality: Left;  . Port-a-cath removal Left 07/29/2013    Procedure: REMOVAL PORT-A-CATH;  Surgeon: Scherry Ran, MD;   Location: [facility];  Service: General;  Laterality: Left;  . Tee without cardioversion N/A 08/07/2013    Procedure: TRANSESOPHAGEAL ECHOCARDIOGRAM (TEE);  Surgeon: Arnoldo Lenis, MD;  Location: [facility];  Service: Cardiology;  Laterality: N/A;   Social History:  reports that she has never smoked. She does not have any smokeless tobacco history on file. She reports that she does not drink alcohol or use illicit drugs.  Allergies  Allergen Reactions  . Desferal [Deferoxamine] Hives    Local reaction on arm only during infusion. Can take with benadryl   . Latex Other (See Comments)    REACTION: Pt experiences a burning sensation on contacted skin areas  . Lisinopril Other (See Comments) and Cough    REACTION: Sore/scratchy throat  . Tape Other (See Comments)    REACTION: Pt. Experiences a burning sensation on contacted skin areas    Family History  Problem Relation Age of Onset  . Sickle cell trait Mother   . Sickle cell trait Father   . Hypertension Father   . Diabetes Father   . Sickle cell trait Sister   . Cancer Paternal Aunt     Breast     Prior to Admission medications   Medication Sig Start Date End Date Taking? Authorizing Provider  cetirizine (ZYRTEC) 10 MG tablet Take 10 mg by mouth daily.   Yes Historical Provider, MD  folic acid (FOLVITE) 1 MG tablet Take 1 tablet (1 mg total)  by mouth daily. 03/31/14  Yes [name], FNP  hydroxyurea (HYDREA) 500 MG capsule TAKE 1 CAPSULE ON ODD DAYS AND 2 CAPSULES ON EVEN DAYS 09/02/14  Yes [name], FNP  Oxycodone HCl 10 MG TABS Take 1 tablet (10 mg total) by mouth every 4 (four) hours as needed (pain). 07/20/14  Yes Leana Gamer, MD  promethazine (PHENERGAN) 12.5 MG tablet Take 1 tablet (12.5 mg total) by mouth every 6 (six) hours as needed for nausea. 1-2 prn begin with 1 if no relief may repeat x's 1 07/20/14  Yes Leana Gamer, MD  Vitamin D, Ergocalciferol, (DRISDOL) 50000 UNITS CAPS capsule Take 1  capsule (50,000 Units total) by mouth every 7 (seven) days. Takes on Sundays. 03/31/14   [name], FNP   Physical Exam: Filed Vitals:   09/10/14 1330 09/10/14 1430 09/10/14 1448 09/10/14 1449  BP: 121/72 95/43 109/63 109/63  Pulse: 83 89 88 89  Temp:      TempSrc:      Resp: 13 23 16 20   Height:      Weight:      SpO2: 96% 95% 95%     Wt Readings from Last 3 Encounters:  09/10/14 69.4 kg (153 lb)  09/09/14 68.947 kg (152 lb)  07/28/14 69.4 kg (153 lb)    General:  Appears to be in some discomfort. She is alert. She is jaundiced. Eyes: PERRL, normal lids, irises & conjunctiva ENT: grossly normal hearing, lips & tongue Neck: no LAD, masses or thyromegaly Cardiovascular: RRR, no m/r/g. No LE edema. Telemetry: SR, no arrhythmias  Respiratory: CTA bilaterally, no w/r/r. Normal respiratory effort. Abdomen: soft, ntnd Skin: no rash or induration seen on limited exam Musculoskeletal: grossly normal tone BUE/BLE Psychiatric: grossly normal mood and affect, speech fluent and appropriate Neurologic: grossly non-focal.          Labs on Admission:  Basic Metabolic Panel:  Recent Labs Lab 09/09/14 1343 09/10/14 0957  NA 137 139  K 3.7 3.9  CL 102 104  CO2 25 23  GLUCOSE 115* 88  BUN 4* 7  CREATININE 0.60 0.56  CALCIUM 8.8 8.5   Liver Function Tests:  Recent Labs Lab 09/09/14 1343 09/10/14 0957  AST 70* 73*  ALT 28 28  ALKPHOS 156* 150*  BILITOT 7.9* 9.6*  PROT 7.6 7.3  ALBUMIN 3.0* 3.0*   No results for input(s): LIPASE, AMYLASE in the last 168 hours. No results for input(s): AMMONIA in the last 168 hours. CBC:  Recent Labs Lab 09/09/14 1343 09/10/14 0957  WBC 7.9 12.2*  NEUTROABS 3.8 7.8*  HGB 7.6* 7.7*  HCT 20.7* 20.9*  MCV 102.0* 102.0*  PLT 215 221   Cardiac Enzymes: No results for input(s): CKTOTAL, CKMB, CKMBINDEX, TROPONINI in the last 168 hours.  BNP (last 3 results) No results for input(s): PROBNP in the last 8760 hours. CBG: No  results for input(s): GLUCAP in the last 168 hours.  Radiological Exams on Admission: Nm Pulmonary Perf And Vent  09/10/2014   CLINICAL DATA:  Shortness of breath with right-sided chest pain for 1 day. Evaluate for pulmonary embolism. Initial encounter.  EXAM: NUCLEAR MEDICINE VENTILATION - PERFUSION LUNG SCAN  TECHNIQUE: Ventilation images were obtained in multiple projections using inhaled aerosol technetium 99 M DTPA. Perfusion images were obtained in multiple projections after intravenous injection of Tc-29m MAA.  RADIOPHARMACEUTICALS:  40.0 mCi Tc-22m DTPA aerosol and 6.0 mCi Tc-67m MAA  COMPARISON:  Portable chest same date.  FINDINGS:  Ventilation: No focal ventilation defect.  Perfusion: The perfusion is normal. There are no perfusion defects to suggest pulmonary embolism.  IMPRESSION: Normal examination.  No evidence of acute pulmonary embolism.   Electronically Signed   By: Camie Patience M.D.   On: 09/10/2014 12:52   Dg Chest Portable 1 View  09/10/2014   CLINICAL DATA:  Right-sided chest pain, shortness of Breath. Sickle cell disease.  EXAM: PORTABLE CHEST - 1 VIEW  COMPARISON:  06/15/2014  FINDINGS: Heart is mildly enlarged. Lungs are clear. No effusions or edema. No acute bony abnormality.  IMPRESSION: No active disease.   Electronically Signed   By: Rolm Baptise M.D.   On: 09/10/2014 10:41      Assessment/Plan   1. Sickle cell crisis with hemolysis. Hemoglobin appears to be reduced but stable compared to yesterday. She does not have any pulmonary embolism on nuclear medicine scanning. There is no obvious pneumonia. She will be treated with intravenous fluids and analgesia as required. She has been started on hydromorphone PCA pump and will receive intravenous hydromorphone on a when necessary basis . Her hemoglobin will be monitored and she may require blood transfusion or even plasmapheresis depending on her progress. He started, she has been very dependable regarding her pain  control. 2. Hypertension, this is controlled.  Further recommendations will depend on patient's hospital progress.   Code Status: Full code.   DVT Prophylaxis: Heparin  Family Communication: I discussed the plan with the patient at the bedside.   Disposition Plan: Home when medically stable.   Time spent: 60 minutes.  Doree Albee [facility] Pager [phone].

## 2014-09-10 NOTE — ED Notes (Signed)
Right sided chest pain,  Bilateral arm pain,  Lower back pain since Monday.  Seen at sickle cell clinic yesterday.

## 2014-09-10 NOTE — ED Provider Notes (Signed)
CSN: [id number]     Arrival date & time 09/10/14  0831 History   First MD Initiated Contact with Patient 09/10/14 [phone]     Chief Complaint  Patient presents with  . Sickle Cell Pain Crisis     (Consider location/radiation/quality/duration/timing/severity/associated sxs/prior Treatment) The history is provided by the patient.   Dawn [name] is a 33 y.o. female with a past medical history significant for sickle cell disease presenting with a sickle cell flare.  She describes a deep aching bone pain consistent with prior flares in her right chest, lower back right forearm and bilateral knees.  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  She took her pulse ox while there and found it to be 83% so presents here.  She has had no recent URI symptoms, cough, wheezing or fevers.  She was seen at the sickle cell clinic yesterday for sickle cell flare and left there feeling improved but not pain-free.  She takes oxycodone 5-10 mg every 4 hours when necessary and her last dose was taken approximately 4 AM this morning prior to her starting her work shift.  Her hemoglobin runs between 8 and 10.     Past Medical History  Diagnosis Date  . Sickle cell disease   . Sickle cell disease, type S   . Blood transfusion   . 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 HCAP (healthcare-associated pneumonia) 05/19/2013  . Sickle cell anemia    Past Surgical History  Procedure Laterality Date  . Cholecystectomy    .  left knee acl reconstruction    . Cesarean section      x 2  . Portacath placement    . Portacath placement Left 02/23/2013    Procedure: INSERTION PORT-A-CATH;  Surgeon: Donato Heinz, MD;  Location: [facility];  Service: General;  Laterality: Left;  . Port-a-cath removal Left 07/29/2013    Procedure: REMOVAL PORT-A-CATH;  Surgeon: Scherry Ran, MD;  Location: [facility];  Service: General;  Laterality:  Left;  . Tee without cardioversion N/A 08/07/2013    Procedure: TRANSESOPHAGEAL ECHOCARDIOGRAM (TEE);  Surgeon: Arnoldo Lenis, MD;  Location: [facility];  Service: Cardiology;  Laterality: N/A;   Family History  Problem Relation Age of Onset  . Sickle cell trait Mother   . Sickle cell trait Father   . Hypertension Father   . Diabetes Father   . Sickle cell trait Sister   . Cancer Paternal Aunt     Breast   History  Substance Use Topics  . Smoking status: Never Smoker   . Smokeless tobacco: Not on file  . Alcohol Use: No   OB History    No data available     Review of Systems  Constitutional: Negative for fever and chills.  HENT: Negative for congestion.   Respiratory: Positive for shortness of breath. Negative for cough, wheezing and stridor.   Cardiovascular: Positive for chest pain.  Gastrointestinal: Negative for nausea and abdominal pain.  Genitourinary: Negative.   Musculoskeletal: Positive for back pain and arthralgias. Negative for joint swelling and neck pain.  Skin: Negative.  Negative for rash and wound.  Neurological: Negative for dizziness, weakness, light-headedness, numbness and headaches.  Psychiatric/Behavioral: Negative.       Allergies  Desferal; Latex; Lisinopril; and Tape  Home Medications   Prior to Admission medications   Medication Sig Start Date End Date Taking? Authorizing Provider  cetirizine (ZYRTEC) 10 MG tablet Take 10 mg by mouth daily.   Yes Historical Provider, MD  folic acid (FOLVITE) 1 MG tablet Take 1 tablet (1 mg total) by mouth daily. 03/31/14  Yes [name], FNP  hydroxyurea (HYDREA) 500 MG capsule TAKE 1 CAPSULE ON ODD DAYS AND 2 CAPSULES ON EVEN DAYS 09/02/14  Yes [name], FNP  Oxycodone HCl 10 MG TABS Take 1 tablet (10 mg total) by mouth every 4 (four) hours as needed (pain). 07/20/14  Yes Leana Gamer, MD  promethazine (PHENERGAN) 12.5 MG tablet Take 1 tablet (12.5 mg total) by mouth every 6 (six)  hours as needed for nausea. 1-2 prn begin with 1 if no relief may repeat x's 1 07/20/14  Yes Leana Gamer, MD  Vitamin D, Ergocalciferol, (DRISDOL) 50000 UNITS CAPS capsule Take 1 capsule (50,000 Units total) by mouth every 7 (seven) days. Takes on Sundays. 03/31/14   [name], FNP   BP 131/64 mmHg  Pulse 100  Temp(Src) 99.4 F (37.4 C) (Rectal)  Resp 27  Ht 5\' 3"  (1.6 m)  Wt 153 lb (69.4 kg)  BMI 27.11 kg/m2  SpO2 97% Physical Exam  Constitutional: She appears well-developed and well-nourished.  HENT:  Head: Normocephalic and atraumatic.  Eyes: Scleral icterus is present.  Neck: Normal range of motion.  Cardiovascular: Normal rate, regular rhythm, normal heart sounds and intact distal pulses.   Pulmonary/Chest: Effort normal and breath sounds normal. No tachypnea. She has no decreased breath sounds. She has no wheezes. She has no rhonchi. She has no rales. She exhibits tenderness.  Oxygen saturation 86% on room air which improved to 95% with 2 L nasal cannula.  She is tender to palpation along her right anterior chest wall.  Abdominal: Soft. Bowel sounds are normal. There is no tenderness.  Musculoskeletal: Normal range of motion. She exhibits edema and tenderness.  Tender to palpation bilateral knees including distal femurs and proximal tibia area.  Pedal pulses are intact.  No edema, no calf tenderness.  She is also tender in her right forearm and there is a mild nonpitting edema which she reports was there yesterday also and unchanged.  Radial pulses are intact, less than 2 second cap refill in fingertips.  Neurological: She is alert.  Skin: Skin is warm and dry.  Psychiatric: She has a normal mood and affect.  Nursing note and vitals reviewed.   ED Course  Procedures (including critical care time) Labs Review Labs Reviewed  COMPREHENSIVE METABOLIC PANEL - Abnormal; Notable for the following:    Albumin 3.0 (*)    AST 73 (*)    Alkaline Phosphatase 150 (*)     Total Bilirubin 9.6 (*)    All other components within normal limits  CBC WITH DIFFERENTIAL - Abnormal; Notable for the following:    WBC 12.2 (*)    RBC 2.05 (*)    Hemoglobin 7.7 (*)    HCT 20.9 (*)    MCV 102.0 (*)    MCH 37.6 (*)    MCHC 36.8 (*)    RDW 22.3 (*)    Neutro Abs 7.8 (*)    Monocytes Relative 16 (*)    Monocytes Absolute 2.0 (*)    All other components within normal limits  RETICULOCYTES - Abnormal; Notable for the following:    Retic Ct Pct >23.0 (*)    RBC. 2.05 (*)    All other components within normal limits  D-DIMER, QUANTITATIVE - Abnormal; Notable  for the following:    D-Dimer, Quant 3.37 (*)    All other components within normal limits    Imaging Review Nm Pulmonary Perf And Vent  09/10/2014   CLINICAL DATA:  Shortness of breath with right-sided chest pain for 1 day. Evaluate for pulmonary embolism. Initial encounter.  EXAM: NUCLEAR MEDICINE VENTILATION - PERFUSION LUNG SCAN  TECHNIQUE: Ventilation images were obtained in multiple projections using inhaled aerosol technetium 99 M DTPA. Perfusion images were obtained in multiple projections after intravenous injection of Tc-24m MAA.  RADIOPHARMACEUTICALS:  40.0 mCi Tc-58m DTPA aerosol and 6.0 mCi Tc-30m MAA  COMPARISON:  Portable chest same date.  FINDINGS: Ventilation: No focal ventilation defect.  Perfusion: The perfusion is normal. There are no perfusion defects to suggest pulmonary embolism.  IMPRESSION: Normal examination.  No evidence of acute pulmonary embolism.   Electronically Signed   By: Camie Patience M.D.   On: 09/10/2014 12:52   Dg Chest Portable 1 View  09/10/2014   CLINICAL DATA:  Right-sided chest pain, shortness of Breath. Sickle cell disease.  EXAM: PORTABLE CHEST - 1 VIEW  COMPARISON:  06/15/2014  FINDINGS: Heart is mildly enlarged. Lungs are clear. No effusions or edema. No acute bony abnormality.  IMPRESSION: No active disease.   Electronically Signed   By: Rolm Baptise M.D.   On: 09/10/2014  10:41     EKG Interpretation   Date/Time:  Friday September 10 2014 09:10:20 EST Ventricular Rate:  97 PR Interval:  148 QRS Duration: 78 QT Interval:  362 QTC Calculation: 460 R Axis:   71 Text Interpretation:  Sinus rhythm No significant change was found  Confirmed by Wyvonnia Dusky  MD, STEPHEN [phone]) on 09/10/2014 9:23:46 AM      MDM   Final diagnoses:  Chest pain  Hypoxia  Sickle cell anemia with crisis    Patients labs and/or radiological studies were viewed and considered during the medical decision making and disposition process.  Pt was given IV fluids, multiple doses of dilaudid per IV without significant pain relief.    Hypoxia of unclear etiology.  No pneumonia per cxr, no PE per VQ scan.  Sats to 82% on room air.  Discussed with Dr Wyvonnia Dusky who also saw pt during this visit.  Will admit for further tx.  Spoke with Dr. Wendee Beavers who accepts pt.  Temp admit orders placed.    Evalee Jefferson, PA-C 09/10/14 1657  [name], MD 09/10/14 [phone]

## 2014-09-11 DIAGNOSIS — R7989 Other specified abnormal findings of blood chemistry: Secondary | ICD-10-CM

## 2014-09-11 LAB — RETICULOCYTES
RBC.: 1.77 MIL/uL — ABNORMAL LOW (ref 3.87–5.11)
Retic Ct Pct: >23 % — ABNORMAL HIGH (ref 0.4–3.1)

## 2014-09-11 LAB — CBC
HCT: 18.1 % — ABNORMAL LOW (ref 36.0–46.0)
HEMOGLOBIN: 6.6 g/dL — AB (ref 12.0–15.0)
MCH: 37.3 pg — ABNORMAL HIGH (ref 26.0–34.0)
MCHC: 36.5 g/dL — ABNORMAL HIGH (ref 30.0–36.0)
MCV: 102.3 fL — ABNORMAL HIGH (ref 78.0–100.0)
PLATELETS: 166 10*3/uL (ref 150–400)
RBC: 1.77 MIL/uL — ABNORMAL LOW (ref 3.87–5.11)
RDW: 20.4 % — ABNORMAL HIGH (ref 11.5–15.5)
WBC: 9.3 10*3/uL (ref 4.0–10.5)

## 2014-09-11 LAB — COMPREHENSIVE METABOLIC PANEL
ALBUMIN: 2.4 g/dL — AB (ref 3.5–5.2)
ALT: 26 U/L (ref 0–35)
AST: 63 U/L — ABNORMAL HIGH (ref 0–37)
Alkaline Phosphatase: 126 U/L — ABNORMAL HIGH (ref 39–117)
Anion gap: 10 (ref 5–15)
BUN: 4 mg/dL — ABNORMAL LOW (ref 6–23)
CO2: 22 mEq/L (ref 19–32)
CREATININE: 0.49 mg/dL — AB (ref 0.50–1.10)
Calcium: 7.8 mg/dL — ABNORMAL LOW (ref 8.4–10.5)
Chloride: 107 mEq/L (ref 96–112)
GFR calc Af Amer: >90 mL/min (ref >90)
GFR calc non Af Amer: >90 mL/min (ref >90)
Glucose, Bld: 122 mg/dL — ABNORMAL HIGH (ref 70–99)
Potassium: 3.4 mEq/L — ABNORMAL LOW (ref 3.7–5.3)
SODIUM: 139 meq/L (ref 137–147)
Total Bilirubin: 5.6 mg/dL — ABNORMAL HIGH (ref 0.3–1.2)
Total Protein: 6.3 g/dL (ref 6.0–8.3)

## 2014-09-11 LAB — PREPARE RBC (CROSSMATCH)

## 2014-09-11 MED ORDER — DIPHENHYDRAMINE HCL 25 MG PO CAPS
25.0000 mg | ORAL_CAPSULE | Freq: Three times a day (TID) | ORAL | Status: DC | PRN
  Administered 2014-09-11 – 2014-09-12 (×2): 25 mg via ORAL
  Filled 2014-09-11 (×2): qty 1

## 2014-09-11 MED ORDER — HYDROXYUREA 500 MG PO CAPS: 1000.0000 mg | ORAL_CAPSULE | ORAL | Status: DC

## 2014-09-11 MED ORDER — DIPHENHYDRAMINE HCL 50 MG/ML IJ SOLN
12.5000 mg | Freq: Once | INTRAMUSCULAR | Status: AC
  Administered 2014-09-11: 12.5 mg via INTRAVENOUS
  Filled 2014-09-11: qty 1

## 2014-09-11 MED ORDER — HYDROXYUREA 500 MG PO CAPS
500.0000 mg | ORAL_CAPSULE | ORAL | Status: DC
  Administered 2014-09-11: 500 mg via ORAL
  Filled 2014-09-11: qty 1

## 2014-09-11 MED ORDER — METHOCARBAMOL 500 MG PO TABS
500.0000 mg | ORAL_TABLET | Freq: Three times a day (TID) | ORAL | Status: DC | PRN
  Administered 2014-09-11 – 2014-09-12 (×2): 500 mg via ORAL
  Filled 2014-09-11 (×2): qty 1

## 2014-09-11 MED ORDER — SODIUM CHLORIDE 0.9 % IV SOLN
Freq: Once | INTRAVENOUS | Status: AC
  Administered 2014-09-11: 16:00:00 via INTRAVENOUS

## 2014-09-11 NOTE — Progress Notes (Signed)
[facility] PROGRESS NOTE  Dawn [name] KGM:[id number] DOB: [birth year] DOA: 09/10/2014 PCP: [name],[name], MD    Code Status: Full code Family Communication: Discussed with patient; family not available Disposition Plan: Discharged home in clinically appropriate   Consultants:  None  Procedures:  09/11/14: Transfusion of 2 units of packed red blood cells  Antibiotics:  None  HPI/Subjective: Patient says that the hydromorphone pump brings her pain down to a 2 from 10 over 10. She has some lower back muscle spasms-the heat pack is helping only a little.  Objective: Filed Vitals:   09/11/14 0800  BP:   Pulse:   Temp:   Resp: 18   temperature 98.6. Pulse rate 75. Respiratory rate 18. Blood pressure 117/67. Oxygen saturation 98% on supplemental oxygen.  Intake/Output Summary (Last 24 hours) at 09/11/14 0915 Last data filed at 09/11/14 0538  Gross per 24 hour  Intake 2007.92 ml  Output    800 ml  Net 1207.92 ml   Filed Weights   09/10/14 0839 09/10/14 1648  Weight: 69.4 kg (153 lb) 69.854 kg (154 lb)    Exam:   General:  33 year old African-American woman sitting up in bed, in no acute distress.  Cardiovascular: S1, S2, with a 1 to 2/6 systolic murmur.  Respiratory: Breathing nonlabored, rare wheezes in the mid lobes.  Abdomen: Positive bowel sounds, soft, nontender, nondistended.  Musculoskeletal: Mild diffuse tenderness over the lumbar muscles, proximal arms, and proximal legs laterally. No acute hot red joints. No rashes.   Data Reviewed: Basic Metabolic Panel:  Recent Labs Lab 09/09/14 1343 09/10/14 0957 09/11/14 0638  NA 137 139 139  K 3.7 3.9 3.4*  CL 102 104 107  CO2 25 23 22   GLUCOSE 115* 88 122*  BUN 4* 7 4*  CREATININE 0.60 0.56 0.49*  CALCIUM 8.8 8.5 7.8*   Liver Function Tests:  Recent Labs Lab 09/09/14 1343 09/10/14 0957 09/11/14 0638  AST 70* 73* 63*  ALT 28 28 26   ALKPHOS 156* 150* 126*  BILITOT 7.9* 9.6* 5.6*   PROT 7.6 7.3 6.3  ALBUMIN 3.0* 3.0* 2.4*   No results for input(s): LIPASE, AMYLASE in the last 168 hours. No results for input(s): AMMONIA in the last 168 hours. CBC:  Recent Labs Lab 09/09/14 1343 09/10/14 0957 09/11/14 0638  WBC 7.9 12.2* 9.3  NEUTROABS 3.8 7.8*  --   HGB 7.6* 7.7* 6.6*  HCT 20.7* 20.9* 18.1*  MCV 102.0* 102.0* 102.3*  PLT 215 221 166   Cardiac Enzymes: No results for input(s): CKTOTAL, CKMB, CKMBINDEX, TROPONINI in the last 168 hours. BNP (last 3 results) No results for input(s): PROBNP in the last 8760 hours. CBG: No results for input(s): GLUCAP in the last 168 hours.  No results found for this or any previous visit (from the past 240 hour(s)).   Studies: Nm Pulmonary Perf And Vent  09/10/2014   CLINICAL DATA:  Shortness of breath with right-sided chest pain for 1 day. Evaluate for pulmonary embolism. Initial encounter.  EXAM: NUCLEAR MEDICINE VENTILATION - PERFUSION LUNG SCAN  TECHNIQUE: Ventilation images were obtained in multiple projections using inhaled aerosol technetium 99 M DTPA. Perfusion images were obtained in multiple projections after intravenous injection of Tc-28m MAA.  RADIOPHARMACEUTICALS:  40.0 mCi Tc-53m DTPA aerosol and 6.0 mCi Tc-50m MAA  COMPARISON:  Portable chest same date.  FINDINGS: Ventilation: No focal ventilation defect.  Perfusion: The perfusion is normal. There are no perfusion defects to suggest pulmonary embolism.  IMPRESSION: Normal examination.  No evidence of acute pulmonary embolism.   Electronically Signed   By: Camie Patience M.D.   On: 09/10/2014 12:52   Dg Chest Portable 1 View  09/10/2014   CLINICAL DATA:  Right-sided chest pain, shortness of Breath. Sickle cell disease.  EXAM: PORTABLE CHEST - 1 VIEW  COMPARISON:  06/15/2014  FINDINGS: Heart is mildly enlarged. Lungs are clear. No effusions or edema. No acute bony abnormality.  IMPRESSION: No active disease.   Electronically Signed   By: Rolm Baptise M.D.   On:  09/10/2014 10:41    Scheduled Meds: . sodium chloride   Intravenous Once  . antiseptic oral rinse  7 mL Mouth Rinse BID  . folic acid  1 mg Oral Daily  . heparin  5,000 Units Subcutaneous 3 times per day  . HYDROmorphone PCA 0.3 mg/mL   Intravenous 6 times per day  . loratadine  10 mg Oral Daily  . [START ON 09/12/2014] Vitamin D (Ergocalciferol)  50,000 Units Oral Q7 days   Continuous Infusions: . sodium chloride    . sodium chloride 125 mL/hr at 09/11/14 0850   Assessment and plan:  Principal Problem:   Sickle cell crisis Active Problems:   Sickle cell anemia with crisis   Hyperbilirubinemia   Elevated LFTs   1. Sickle cell crisis with chest pain and musculoskeletal pain in her back and legs/arms. The VQ scan revealed no evidence of acute pulmonary embolism. She has some symptomatic improvement with IV fluid hydration and hydromorphone PCA. We'll add Robaxin every 8 hours when necessary for muscle spasms. She is not quite ready for discharge yet.   Sickle cell anemia. Her hemoglobin has fallen below 7 g. Therefore, will hold hydroxyurea and transfuse 2 units of packed red blood cells. Continue to monitor CBC daily.  Elevated LFTs and hyperbilirubinemia, secondary to sickle cell crisis. Levels are still elevated, but decreasing. Continue to monitor.   Time spent: 30 minutes    [facility] Pager [phone]. If 7PM-7AM, please contact night-coverage at [email or web address], password [facility] 09/11/2014, 9:15 AM  LOS: 1 day

## 2014-09-11 NOTE — Progress Notes (Signed)
CRITICAL VALUE ALERT  Critical value received:  Hemoglobin 6.6  Date of notification:  09/11/2014  Time of notification:  0727  Critical value read back:Yes.    Nurse who received alert:  Cecilie Kicks, RN  MD notified (1st page):  Dr. Caryn Section  Time of first page:  09/11/2014 5872  Responding MD:  Dr. Caryn Section  Time MD responded:  Order placed [phone]

## 2014-09-12 DIAGNOSIS — J9601 Acute respiratory failure with hypoxia: Secondary | ICD-10-CM

## 2014-09-12 LAB — COMPREHENSIVE METABOLIC PANEL
ALT: 26 U/L (ref 0–35)
AST: 63 U/L — AB (ref 0–37)
Albumin: 2.6 g/dL — ABNORMAL LOW (ref 3.5–5.2)
Alkaline Phosphatase: 139 U/L — ABNORMAL HIGH (ref 39–117)
Anion gap: 11 (ref 5–15)
BUN: 4 mg/dL — ABNORMAL LOW (ref 6–23)
CALCIUM: 8.1 mg/dL — AB (ref 8.4–10.5)
CO2: 23 mEq/L (ref 19–32)
Chloride: 104 mEq/L (ref 96–112)
Creatinine, Ser: 0.44 mg/dL — ABNORMAL LOW (ref 0.50–1.10)
GFR calc Af Amer: >90 mL/min (ref >90)
GFR calc non Af Amer: >90 mL/min (ref >90)
Glucose, Bld: 99 mg/dL (ref 70–99)
Potassium: 3.5 mEq/L — ABNORMAL LOW (ref 3.7–5.3)
Sodium: 138 mEq/L (ref 137–147)
TOTAL PROTEIN: 6.8 g/dL (ref 6.0–8.3)
Total Bilirubin: 5.7 mg/dL — ABNORMAL HIGH (ref 0.3–1.2)

## 2014-09-12 LAB — TYPE AND SCREEN
ABO/RH(D): A POS
ANTIBODY SCREEN: POSITIVE
DAT, IGG: NEGATIVE
UNIT DIVISION: 0
Unit division: 0

## 2014-09-12 LAB — RETICULOCYTES: RBC.: 2.82 MIL/uL — AB (ref 3.87–5.11)

## 2014-09-12 LAB — CBC
HCT: 28.1 % — ABNORMAL LOW (ref 36.0–46.0)
Hemoglobin: 10 g/dL — ABNORMAL LOW (ref 12.0–15.0)
MCH: 35.5 pg — AB (ref 26.0–34.0)
MCHC: 35.6 g/dL (ref 30.0–36.0)
MCV: 99.6 fL (ref 78.0–100.0)
PLATELETS: 188 10*3/uL (ref 150–400)
RBC: 2.82 MIL/uL — ABNORMAL LOW (ref 3.87–5.11)
RDW: 20.7 % — ABNORMAL HIGH (ref 11.5–15.5)
WBC: 6.6 10*3/uL (ref 4.0–10.5)

## 2014-09-12 MED ORDER — POTASSIUM CHLORIDE CRYS ER 20 MEQ PO TBCR
40.0000 meq | EXTENDED_RELEASE_TABLET | Freq: Once | ORAL | Status: AC
  Administered 2014-09-12: 40 meq via ORAL
  Filled 2014-09-12: qty 2

## 2014-09-12 NOTE — Progress Notes (Signed)
PROGRESS NOTE  Dawn [name] NOM:[id number] DOB: 06/23/1981 DOA: 09/10/2014 PCP: [name],[name], MD  Summary: 33 year old woman with history of sickle cell anemia presented with deep bone pain consistent with previous sickle cell crises. While work she noted herself to be hypoxic.she was admitted for acute sickle cell crisis with hemolysis and acute hypoxic respiratory failure.  Assessment/Plan: 1. Sickle cell pain crisis with associated hypoxia. Likely secondary to relative hypoventilation with acute pain in combination with anemia. Chest x-ray was negative. Pain typical of usual crisis. Doubt ACS. 2. Acute hypoxic respiratory failure. Chest x-ray unremarkable, VQ scan normal, no evidence of PE. 3. Hyperbilirubinemia secondary sickle cell crisis.   Subjectively improved. Hemodynamics are stable. Pain controlled on Dilaudid PCA, not yet ready to discontinue.hemoglobin improved status post transfusion.  Plan to continue Dilaudid PCA.  Wean oxygen, ambulate.  Will discuss with Dr. Zigmund Daniel 12/21.  Code Status: full code DVT prophylaxis: heparin SQ Family Communication: none  Disposition Plan: home  Murray Hodgkins, MD  [facility]  Pager [phone] If 7PM-7AM, please contact night-coverage at [email or web address], password [facility] 09/12/2014, 12:04 PM  LOS: 2 days   Consultants:    Procedures:  09/11/14: Transfusion of 2 units of packed red blood cells  Antibiotics:    HPI/Subjective: Hemoglobin less than 6 yesterday, therefore was transfused 2 units packed red blood cells.  Overall feeling some better, pain fairly well controlled but still significant.  Eating: eating well. Sleeping: no problems. GI: no nausea or vomiting. Pain: as above, still on Dilaudid PCA Breathing: improved Desires: no requests  Objective: Filed Vitals:   09/12/14 0300 09/12/14 0415 09/12/14 0607 09/12/14 0800  BP: 117/64  125/71   Pulse: 83  87   Temp: 98.4 F (36.9 C)  98.1 F (36.7  C)   TempSrc: Oral  Oral   Resp: 20 16 18 18   Height:      Weight:      SpO2: 100% 97% 97% 100%    Intake/Output Summary (Last 24 hours) at 09/12/14 1204 Last data filed at 09/12/14 0535  Gross per 24 hour  Intake 4358.42 ml  Output      0 ml  Net 4358.42 ml     Filed Weights   09/10/14 0839 09/10/14 1648  Weight: 69.4 kg (153 lb) 69.854 kg (154 lb)    Exam:     Afebrile, vital signs are stable. 100% on 3 L.  General: appears calm, comfortable.  Psych: alert. Speech fluent and clear.  CV: regular rate and rhythm. No murmur, rub or gallop. No lower extremity edema.  Respiratory: clear to auscultation bilaterally. No wheezes, rales or rhonchi. Normal respiratory effort.  Data Reviewed:  +5.4 L since admission, however multiple voids unrecorded.BM 1.  Potassium 3.5. Total bilirubin stable, 5.7. AST stable, 63.  Hemoglobin 6.6 >> 10.0  Pertinent data:  Imaging: VQ scan normal. No evidence of PE. Chest x-ray no acute disease.  Scheduled Meds: . antiseptic oral rinse  7 mL Mouth Rinse BID  . folic acid  1 mg Oral Daily  . heparin  5,000 Units Subcutaneous 3 times per day  . HYDROmorphone PCA 0.3 mg/mL   Intravenous 6 times per day  . loratadine  10 mg Oral Daily  . Vitamin D (Ergocalciferol)  50,000 Units Oral Q7 days   Continuous Infusions: . sodium chloride    . sodium chloride 125 mL/hr at 09/12/14 2947    Principal Problem:   Sickle cell crisis Active Problems:   Elevated LFTs  Sickle cell anemia with crisis   Hyperbilirubinemia   Acute respiratory failure with hypoxia   Time spent 20 minutes

## 2014-09-13 ENCOUNTER — Telehealth: Payer: Self-pay | Admitting: Internal Medicine

## 2014-09-13 LAB — CBC
HCT: 30.9 % — ABNORMAL LOW (ref 36.0–46.0)
Hemoglobin: 10.9 g/dL — ABNORMAL LOW (ref 12.0–15.0)
MCH: 35.2 pg — ABNORMAL HIGH (ref 26.0–34.0)
MCHC: 35.3 g/dL (ref 30.0–36.0)
MCV: 99.7 fL (ref 78.0–100.0)
PLATELETS: 188 10*3/uL (ref 150–400)
RBC: 3.1 MIL/uL — AB (ref 3.87–5.11)
RDW: 21.4 % — ABNORMAL HIGH (ref 11.5–15.5)
WBC: 6.1 10*3/uL (ref 4.0–10.5)

## 2014-09-13 LAB — COMPREHENSIVE METABOLIC PANEL
ALT: 29 U/L (ref 0–35)
AST: 70 U/L — AB (ref 0–37)
Albumin: 2.9 g/dL — ABNORMAL LOW (ref 3.5–5.2)
Alkaline Phosphatase: 167 U/L — ABNORMAL HIGH (ref 39–117)
Anion gap: 11 (ref 5–15)
BUN: 5 mg/dL — ABNORMAL LOW (ref 6–23)
CALCIUM: 8.7 mg/dL (ref 8.4–10.5)
CO2: 27 meq/L (ref 19–32)
Chloride: 99 mEq/L (ref 96–112)
Creatinine, Ser: 0.48 mg/dL — ABNORMAL LOW (ref 0.50–1.10)
GFR calc Af Amer: >90 mL/min (ref >90)
GFR calc non Af Amer: >90 mL/min (ref >90)
Glucose, Bld: 103 mg/dL — ABNORMAL HIGH (ref 70–99)
Potassium: 3.9 mEq/L (ref 3.7–5.3)
SODIUM: 137 meq/L (ref 137–147)
TOTAL PROTEIN: 7.5 g/dL (ref 6.0–8.3)
Total Bilirubin: 7.7 mg/dL — ABNORMAL HIGH (ref 0.3–1.2)

## 2014-09-13 MED ORDER — OXYCODONE HCL 10 MG PO TABS: 10.0000 mg | ORAL_TABLET | ORAL | Status: DC | PRN

## 2014-09-13 MED ORDER — PROMETHAZINE HCL 12.5 MG PO TABS
12.5000 mg | ORAL_TABLET | Freq: Four times a day (QID) | ORAL | Status: DC | PRN
Start: 2014-09-13 — End: 2014-10-28

## 2014-09-13 NOTE — Progress Notes (Signed)
Discharge instructions reviewed with patient. Understanding verbalized. IV's removed. Awaiting ride for discharge home.

## 2014-09-13 NOTE — Progress Notes (Signed)
UR chart review completed.  

## 2014-09-13 NOTE — Care Management Note (Signed)
    Page 1 of 1   09/13/2014     10:23:04 AM CARE MANAGEMENT NOTE 09/13/2014  Patient:  Tesoro,Adalind L   Account Number:  [id number]  Date Initiated:  09/13/2014  Documentation initiated by:  Theophilus Kinds  Subjective/Objective Assessment:   Pt admitted from home with sickle cell crisis. Pt lives alone and will return home at discharge. Pt is independent with ADL's.     Action/Plan:   Potential discharge today. No Cm needs noted.   Anticipated DC Date:  09/13/2014   Anticipated DC Plan:  [place]  CM consult      Choice offered to / List presented to:             Status of service:  Completed, signed off Medicare Important Message given?  YES (If response is "NO", the following Medicare IM given date fields will be blank) Date Medicare IM given:  09/13/2014 Medicare IM given by:  Theophilus Kinds Date Additional Medicare IM given:   Additional Medicare IM given by:    Discharge Disposition:  HOME/SELF CARE  Per UR Regulation:    If discussed at Long Length of Stay Meetings, dates discussed:    Comments:  09/13/14 [place], RN BSN CM

## 2014-09-13 NOTE — Telephone Encounter (Signed)
Spoke with Dr. Sarajane Jews. Dawn [name] being discharged from [facility]. She is almost out of her oral analgesics. Discussed with Dr. Sarajane Jews and he will write her a prescription for Oxycodone 10 mg #60 tabs due to the holiday timing. She will receive her subsequent prescriptions from the [facility].

## 2014-09-13 NOTE — Discharge Summary (Signed)
Physician Discharge Summary  Dawn [name] QVZ:[id number] DOB: Apr 03, 1981 DOA: 09/10/2014  PCP: Dawn A., MD  Admit date: 09/10/2014 Discharge date: 09/13/2014  Recommendations for Outpatient Follow-up:  1. Ongoing care for sickle cell anemia 2. Note: Patient reported out of prescription for oxycodone. Discussed with Dr. Zigmund Beauden [place] who requested I provide pain medication prescription given the approaching holidays for oxycodone 10mg  q4 PRN moderate pain #60. Dr. Zigmund Hibah Odonnell confirms last prescription given was 07/13/2014 for oxycodone 10 mg po q4 PRN.   Follow-up Information    Follow up with Dawn A., MD. Schedule an appointment as soon as possible for a visit in 2 weeks.   Specialty:  Internal Medicine   Contact information:   [place]  [phone]      Discharge Diagnoses:  1. Sickle cell pain crisis with acute on chronic sickle cell anemia 2. Acute hypoxic respiratory failure secondary to acute sickle cell crisis 3. Chronic hyperbilirubinemia secondary to sickle cell anemia  Discharge Condition: Improved Disposition: Home  Diet recommendation: Regular  Filed Weights   09/10/14 0839 09/10/14 1648  Weight: 69.4 kg (153 lb) 69.854 kg (154 lb)    History of present illness:  33 year old woman with history of sickle cell anemia presented with deep bone pain consistent with previous sickle cell crises. While work she noted herself to be hypoxic.she was admitted for acute sickle cell crisis with hemolysis and acute hypoxic respiratory failure.  Hospital Course:  [name] was admitted for treatment of acute sickle cell crisis, treated with Dilaudid PCA, fluids and supportive care. Hypoxic respiratory failure resolved with supportive care and treatment of underlying condition. She was noted to be anemic and was transfused. Her hospitalization was uncomplicated and she is now stable for discharge. See individual issues  below.  1. Sickle cell pain crisis with associated hypoxia. Likely secondary to relative hypoventilation with acute pain in combination with anemia. Chest x-ray was negative. VQ scan was negative. Hypoxia has resolved. Pain typical of usual crisis. No evidence of acute chest. 2. Acute hypoxic respiratory failure. Resolved. Chest x-ray unremarkable, VQ scan normal, no evidence of PE. 3. Hyperbilirubinemia secondary sickle cell crisis.   Reviewed care, laboratory studies with her primary care physician Dr. Zigmund Calven Gilkes. OK for discharge.  Patient reports out of prescription for oxycodone. Discussed with Dr. Zigmund Randye Treichler who requests I provide pain medication prescription given the approaching holidays. Dr. Zigmund Kavaughn Faucett confirms last prescription given was 07/13/2014 for oxycodone 10 mg po q4 PRN  Procedures:  09/11/14: Transfusion of 2 units of packed red blood cells  Discharge Instructions  Discharge Instructions    Activity as tolerated - No restrictions    Complete by:  As directed      Diet general    Complete by:  As directed      Discharge instructions    Complete by:  As directed   Call your physician or seek immediate medical assistance for increased pain, fever, shortness of breath or worsening of condition.          Current Discharge Medication List    CONTINUE these medications which have CHANGED   Details  Oxycodone HCl 10 MG TABS Take 1 tablet (10 mg total) by mouth every 4 (four) hours as needed (moderate pain). 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    folic acid (FOLVITE) 1 MG tablet Take 1 tablet (1 mg total) by mouth daily. Qty: 30 tablet, Refills: 11    hydroxyurea (HYDREA) 500 MG capsule TAKE 1 CAPSULE ON ODD DAYS AND 2 CAPSULES ON EVEN DAYS Qty: 45 capsule, Refills: 2    promethazine (PHENERGAN)  12.5 MG tablet Take 1 tablet (12.5 mg total) by mouth every 6 (six) hours as needed for nausea. 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 Takes on Sundays. Qty: 30 capsule, Refills: 11   Associated Diagnoses: Vitamin D deficiency       Allergies  Allergen Reactions  . Desferal [Deferoxamine] Hives    Local reaction on arm only during infusion. Can take with benadryl   . Latex Other (See Comments)    REACTION: Pt experiences a burning sensation on contacted skin areas  . Lisinopril Other (See Comments) and Cough    REACTION: Sore/scratchy throat  . Tape Other (See Comments)    REACTION: Pt. Experiences a burning sensation on contacted skin areas    The results of significant diagnostics from this hospitalization (including imaging, microbiology, ancillary and laboratory) are listed below for reference.    Significant Diagnostic Studies: Nm Pulmonary Perf And Vent  09/10/2014   CLINICAL DATA:  Shortness of breath with right-sided chest pain for 1 day. Evaluate for pulmonary embolism. Initial encounter.  EXAM: NUCLEAR MEDICINE VENTILATION - PERFUSION LUNG SCAN  TECHNIQUE: Ventilation images were obtained in multiple projections using inhaled aerosol technetium 99 M DTPA. Perfusion images were obtained in multiple projections after intravenous injection of Tc-51m MAA.  RADIOPHARMACEUTICALS:  40.0 mCi Tc-24m DTPA aerosol and 6.0 mCi Tc-18m MAA  COMPARISON:  Portable chest same date.  FINDINGS: Ventilation: No focal ventilation defect.  Perfusion: The perfusion is normal. There are no perfusion defects to suggest pulmonary embolism.  IMPRESSION: Normal examination.  No evidence of acute pulmonary embolism.   Electronically Signed   By: Camie Patience M.D.   On: 09/10/2014 12:52    Dg Chest Portable 1 View  09/10/2014   CLINICAL DATA:  Right-sided chest pain, shortness of Breath. Sickle cell disease.  EXAM: PORTABLE CHEST - 1 VIEW  COMPARISON:  06/15/2014  FINDINGS: Heart is mildly enlarged. Lungs are clear. No effusions or edema. No acute bony abnormality.  IMPRESSION: No active disease.   Electronically Signed   By: Rolm Baptise M.D.   On: 09/10/2014 10:41   Labs: Basic Metabolic Panel:  Recent Labs Lab 09/09/14 1343 09/10/14 0957 09/11/14 0638 09/12/14 0745 09/13/14 0737  NA 137 139 139 138 137  K 3.7 3.9 3.4* 3.5* 3.9  CL 102 104 107 104 99  CO2 25 23 22 23 27   GLUCOSE 115* 88 122* 99 103*  BUN 4* 7 4* 4* 5*  CREATININE 0.60 0.56 0.49* 0.44* 0.48*  CALCIUM 8.8 8.5 7.8* 8.1* 8.7   Liver Function Tests:  Recent Labs Lab 09/09/14 1343 09/10/14 0957 09/11/14 0638 09/12/14 0745 09/13/14 0737  AST 70* 73* 63* 63* 70*  ALT 28 28 26 26 29   ALKPHOS 156* 150* 126* 139* 167*  BILITOT 7.9* 9.6* 5.6* 5.7* 7.7*  PROT 7.6 7.3 6.3 6.8 7.5  ALBUMIN 3.0* 3.0* 2.4* 2.6* 2.9*   CBC:  Recent Labs Lab  09/09/14 1343 09/10/14 0957 09/11/14 0638 09/12/14 0745 09/13/14 0737  WBC 7.9 12.2* 9.3 6.6 6.1  NEUTROABS 3.8 7.8*  --   --   --   HGB 7.6* 7.7* 6.6* 10.0* 10.9*  HCT 20.7* 20.9* 18.1* 28.1* 30.9*  MCV 102.0* 102.0* 102.3* 99.6 99.7  PLT 215 221 166 188 188    Principal Problem:   Sickle cell crisis Active Problems:   Elevated LFTs   Sickle cell anemia with crisis   Hyperbilirubinemia   Acute respiratory failure with hypoxia   Time coordinating discharge: 35 minutes  Signed:  Murray Hodgkins, MD [facility] 09/13/2014, 4:26 PM

## 2014-09-13 NOTE — Progress Notes (Signed)
PROGRESS NOTE  Dawn [name] HUD:[id number] DOB: Jun 10, 1981 DOA: 09/10/2014 PCP: [name],[name], MD  Summary: 33 year old woman with history of sickle cell anemia presented with deep bone pain consistent with previous sickle cell crises. While work she noted herself to be hypoxic.she was admitted for acute sickle cell crisis with hemolysis and acute hypoxic respiratory failure.  Assessment/Plan: 1. Sickle cell pain crisis with associated hypoxia. Likely secondary to relative hypoventilation with acute pain in combination with anemia. Chest x-ray was negative. VQ scan was negative. Hypoxia has resolved. Pain typical of usual crisis. No evidence of acute chest. 2. Acute hypoxic respiratory failure. Resolved. Chest x-ray unremarkable, VQ scan normal, no evidence of PE. 3. Hyperbilirubinemia secondary sickle cell crisis.   Much improved. Pain well-controlled off of PCA.  Reviewed care, laboratory studies with her primary care physician Dr. Zigmund Daniel. OK for discharge.  Patient reports out of prescription for oxycodone. Discussed with Dr. Zigmund Daniel who requests I provide pain medication prescription given the approaching holidays. Dr. Zigmund Daniel confirms last prescription given was 07/13/2014 for oxycodone 10 mg po q4 PRN  Code Status: full code DVT prophylaxis: heparin SQ Family Communication: none  Disposition Plan: home  Murray Hodgkins, MD  [facility]  Pager [phone] If 7PM-7AM, please contact night-coverage at [email or web address], password [facility] 09/13/2014, 12:37 PM  LOS: 3 days   Consultants:    Procedures:  09/11/14: Transfusion of 2 units of packed red blood cells  Antibiotics:    HPI/Subjective: RN reports the patient feels better, pain better controlled, therefore requested to stop Dilaudid PCA this a.m.   Patient feeling much better. Her pain is back to baseline and she wants to go home. No shortness of breath.  Objective: Filed Vitals:   09/13/14 0400 09/13/14  0543 09/13/14 0800 09/13/14 0937  BP:  128/74  122/78  Pulse:  84  87  Temp:  98.7 F (37.1 C)  98.7 F (37.1 C)  TempSrc:  Oral  Oral  Resp: 13 16 18    Height:      Weight:      SpO2: 93% 96% 95% 96%    Intake/Output Summary (Last 24 hours) at 09/13/14 1237 Last data filed at 09/13/14 1009  Gross per 24 hour  Intake    240 ml  Output    400 ml  Net   -160 ml     Filed Weights   09/10/14 0839 09/10/14 1648  Weight: 69.4 kg (153 lb) 69.854 kg (154 lb)    Exam:     Afebrile, vital signs are stable. 95% on room air.  Appears calm, comfortable. Speech is fluent and clear.  Well-appearing.  Cardiovascular regular rate and rhythm. No murmur, rub or gallop. No lower extremity edema.  Respiratory clear to auscultation bilaterally. No wheezes or rales or rhonchi. Normal respiratory effort.  Data Reviewed:  +5.4 L since admission, however multiple voids unrecorded.BM 1. Appears euvolemic.  Potassium 3.9.   Hemoglobin 6.6 >> 10.0  >> 10.9  Pertinent data:  Imaging: VQ scan normal. No evidence of PE. Chest x-ray no acute disease.  Scheduled Meds: . antiseptic oral rinse  7 mL Mouth Rinse BID  . folic acid  1 mg Oral Daily  . heparin  5,000 Units Subcutaneous 3 times per day  . loratadine  10 mg Oral Daily  . Vitamin D (Ergocalciferol)  50,000 Units Oral Q7 days   Continuous Infusions:    Principal Problem:   Sickle cell crisis Active Problems:   Elevated LFTs   Sickle  cell anemia with crisis   Hyperbilirubinemia   Acute respiratory failure with hypoxia

## 2014-09-13 NOTE — Progress Notes (Signed)
Dilaudid PCA stopped as per order. Dose of dilaudid given at this time. Patient aware she is not longer getting steady dose of medication and must ask for pain medication.

## 2014-10-28 ENCOUNTER — Telehealth: Payer: Self-pay | Admitting: Hematology

## 2014-10-28 ENCOUNTER — Encounter (HOSPITAL_COMMUNITY): Payer: Self-pay | Admitting: Hematology

## 2014-10-28 ENCOUNTER — Other Ambulatory Visit: Payer: Self-pay | Admitting: Hematology

## 2014-10-28 ENCOUNTER — Non-Acute Institutional Stay (HOSPITAL_COMMUNITY)
Admission: AD | Admit: 2014-10-28 | Discharge: 2014-10-28 | Disposition: A | Discharge status: Home / Self Care | Payer: BLUE CROSS/BLUE SHIELD | Attending: Internal Medicine | Admitting: Internal Medicine

## 2014-10-28 DIAGNOSIS — D571 Sickle-cell disease without crisis: Secondary | ICD-10-CM | POA: Diagnosis present

## 2014-10-28 DIAGNOSIS — Z91040 Latex allergy status: Secondary | ICD-10-CM | POA: Diagnosis not present

## 2014-10-28 DIAGNOSIS — D5700 Hb-SS disease with crisis, unspecified: Secondary | ICD-10-CM | POA: Diagnosis present

## 2014-10-28 DIAGNOSIS — Z888 Allergy status to other drugs, medicaments and biological substances status: Secondary | ICD-10-CM | POA: Insufficient documentation

## 2014-10-28 DIAGNOSIS — Z79899 Other long term (current) drug therapy: Secondary | ICD-10-CM | POA: Diagnosis not present

## 2014-10-28 DIAGNOSIS — E860 Dehydration: Secondary | ICD-10-CM | POA: Insufficient documentation

## 2014-10-28 LAB — CBC WITH DIFFERENTIAL/PLATELET
BASOS ABS: 0.1 10*3/uL (ref 0.0–0.1)
Basophils Relative: 1 % (ref 0–1)
Eosinophils Absolute: 0.1 10*3/uL (ref 0.0–0.7)
Eosinophils Relative: 1 % (ref 0–5)
HEMATOCRIT: 23.5 % — AB (ref 36.0–46.0)
Hemoglobin: 8.6 g/dL — ABNORMAL LOW (ref 12.0–15.0)
LYMPHS ABS: 2.5 10*3/uL (ref 0.7–4.0)
Lymphocytes Relative: 30 % (ref 12–46)
MCH: 36.4 pg — ABNORMAL HIGH (ref 26.0–34.0)
MCHC: 36.6 g/dL — ABNORMAL HIGH (ref 30.0–36.0)
MCV: 99.6 fL (ref 78.0–100.0)
Monocytes Absolute: 1.6 10*3/uL — ABNORMAL HIGH (ref 0.1–1.0)
Monocytes Relative: 19 % — ABNORMAL HIGH (ref 3–12)
NEUTROS PCT: 49 % (ref 43–77)
Neutro Abs: 4 10*3/uL (ref 1.7–7.7)
PLATELETS: 232 10*3/uL (ref 150–400)
RBC: 2.36 MIL/uL — AB (ref 3.87–5.11)
RDW: 19.6 % — ABNORMAL HIGH (ref 11.5–15.5)
WBC: 8.3 10*3/uL (ref 4.0–10.5)

## 2014-10-28 LAB — COMPREHENSIVE METABOLIC PANEL
ALT: 33 U/L (ref 0–35)
AST: 77 U/L — ABNORMAL HIGH (ref 0–37)
Albumin: 3.4 g/dL — ABNORMAL LOW (ref 3.5–5.2)
Alkaline Phosphatase: 147 U/L — ABNORMAL HIGH (ref 39–117)
Anion gap: 7 (ref 5–15)
BUN: <5 mg/dL — ABNORMAL LOW (ref 6–23)
CALCIUM: 8.7 mg/dL (ref 8.4–10.5)
CO2: 23 mmol/L (ref 19–32)
Chloride: 107 mmol/L (ref 96–112)
Creatinine, Ser: 0.31 mg/dL — ABNORMAL LOW (ref 0.50–1.10)
GLUCOSE: 84 mg/dL (ref 70–99)
Potassium: 4.1 mmol/L (ref 3.5–5.1)
Sodium: 137 mmol/L (ref 135–145)
Total Bilirubin: 7.6 mg/dL — ABNORMAL HIGH (ref 0.3–1.2)
Total Protein: 7.6 g/dL (ref 6.0–8.3)

## 2014-10-28 LAB — RETICULOCYTES
RBC.: 2.36 MIL/uL — ABNORMAL LOW (ref 3.87–5.11)
Retic Ct Pct: >23 % — ABNORMAL HIGH (ref 0.4–3.1)

## 2014-10-28 LAB — LACTATE DEHYDROGENASE: LDH: 351 U/L — AB (ref 94–250)

## 2014-10-28 MED ORDER — SODIUM CHLORIDE 0.9 % IV SOLN: 25.0000 mg | INTRAVENOUS | Status: DC | PRN

## 2014-10-28 MED ORDER — ONDANSETRON HCL 4 MG PO TABS: 4.0000 mg | ORAL_TABLET | ORAL | Status: DC | PRN

## 2014-10-28 MED ORDER — PROMETHAZINE HCL 12.5 MG PO TABS: 12.5000 mg | ORAL_TABLET | Freq: Four times a day (QID) | ORAL | Status: DC | PRN

## 2014-10-28 MED ORDER — DIPHENHYDRAMINE HCL 25 MG PO CAPS
25.0000 mg | ORAL_CAPSULE | ORAL | Status: DC | PRN
  Administered 2014-10-28: 25 mg via ORAL
  Filled 2014-10-28: qty 1

## 2014-10-28 MED ORDER — POLYETHYLENE GLYCOL 3350 17 G PO PACK
17.0000 g | PACK | Freq: Every day | ORAL | Status: DC | PRN
  Filled 2014-10-28: qty 1

## 2014-10-28 MED ORDER — OXYCODONE HCL 10 MG PO TABS: 10.0000 mg | ORAL_TABLET | ORAL | Status: DC | PRN

## 2014-10-28 MED ORDER — PROMETHAZINE HCL 25 MG/ML IJ SOLN
12.5000 mg | Freq: Four times a day (QID) | INTRAMUSCULAR | Status: DC | PRN
  Administered 2014-10-28: 12.5 mg via INTRAVENOUS
  Filled 2014-10-28: qty 1

## 2014-10-28 MED ORDER — SENNOSIDES-DOCUSATE SODIUM 8.6-50 MG PO TABS: 1.0000 | ORAL_TABLET | Freq: Two times a day (BID) | ORAL | Status: DC

## 2014-10-28 MED ORDER — FOLIC ACID 1 MG PO TABS
1.0000 mg | ORAL_TABLET | Freq: Every day | ORAL | Status: DC
  Filled 2014-10-28: qty 1

## 2014-10-28 MED ORDER — ONDANSETRON HCL 4 MG/2ML IJ SOLN
4.0000 mg | INTRAMUSCULAR | Status: DC | PRN
  Administered 2014-10-28: 4 mg via INTRAVENOUS
  Filled 2014-10-28: qty 2

## 2014-10-28 MED ORDER — DEXTROSE-NACL 5-0.45 % IV SOLN
INTRAVENOUS | Status: DC
  Administered 2014-10-28: 125 mL/h via INTRAVENOUS

## 2014-10-28 MED ORDER — SODIUM CHLORIDE 0.9 % IJ SOLN: 9.0000 mL | INTRAMUSCULAR | Status: DC | PRN

## 2014-10-28 MED ORDER — NALOXONE HCL 0.4 MG/ML IJ SOLN: 0.4000 mg | INTRAMUSCULAR | Status: DC | PRN

## 2014-10-28 MED ORDER — HYDROMORPHONE 2 MG/ML HIGH CONCENTRATION IV PCA SOLN
INTRAVENOUS | Status: DC
  Administered 2014-10-28: 0.5 mg via INTRAVENOUS
  Administered 2014-10-28: 2 mg via INTRAVENOUS
  Administered 2014-10-28: 12.5 mg via INTRAVENOUS
  Filled 2014-10-28: qty 25

## 2014-10-28 MED ORDER — KETOROLAC TROMETHAMINE 30 MG/ML IJ SOLN
30.0000 mg | Freq: Four times a day (QID) | INTRAMUSCULAR | Status: DC
  Administered 2014-10-28: 30 mg via INTRAVENOUS
  Filled 2014-10-28: qty 1

## 2014-10-28 NOTE — H&P (Signed)
Dawn [name] is an 34 y.o. female.   Chief Complaint: Pain in legs and back. HPI: A 34 year old female was known history of sickle cell disease presenting with sudden onset of pain in her legs back and arms for the last 3 days. She's been taking her home medications hoping that a will control it. Pain has not improved. Is currently at 8 out of 10 aggravated by all movement and no filling relieves it. It is burning in nature but also sharp consistent with her typical sickle cell pain. Denied any fever, no shortness of breath, no cough no nausea vomiting or diarrhea.  Past Medical History  Diagnosis Date  . Sickle cell disease   . Sickle cell disease, type S   . Blood transfusion   . 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 HCAP (healthcare-associated pneumonia) 05/19/2013  . Sickle cell anemia     Past Surgical History  Procedure Laterality Date  . Cholecystectomy    .  left knee acl reconstruction    . Cesarean section      x 2  . Portacath placement    . Portacath placement Left 02/23/2013    Procedure: INSERTION PORT-A-CATH;  Surgeon: Donato Heinz, MD;  Location: [facility];  Service: General;  Laterality: Left;  . Port-a-cath removal Left 07/29/2013    Procedure: REMOVAL PORT-A-CATH;  Surgeon: Scherry Ran, MD;  Location: [facility];  Service: General;  Laterality: Left;  . Tee without cardioversion N/A 08/07/2013    Procedure: TRANSESOPHAGEAL ECHOCARDIOGRAM (TEE);  Surgeon: Arnoldo Lenis, MD;  Location: [facility];  Service: Cardiology;  Laterality: N/A;    Family History  Problem Relation Age of Onset  . Sickle cell trait Mother   . Sickle cell trait Father   . Hypertension Father   . Diabetes Father   . Sickle cell trait Sister   . Cancer Paternal Aunt     Breast   Social History:  reports that she has never smoked. She does not have any smokeless tobacco history on file. She reports that she does not drink alcohol or use illicit drugs.  Allergies:  Allergies   Allergen Reactions  . Desferal [Deferoxamine] Hives    Local reaction on arm only during infusion. Can take with benadryl   . Latex Other (See Comments)    REACTION: Pt experiences a burning sensation on contacted skin areas  . Lisinopril Other (See Comments) and Cough    REACTION: Sore/scratchy throat  . Tape Other (See Comments)    REACTION: Pt. Experiences a burning sensation on contacted skin areas    Medications Prior to Admission  Medication Sig Dispense Refill  . cetirizine (ZYRTEC) 10 MG tablet Take 10 mg by mouth daily.    . folic acid (FOLVITE) 1 MG tablet Take 1 tablet (1 mg total) by mouth daily. 30 tablet 11  . hydroxyurea (HYDREA) 500 MG capsule TAKE 1 CAPSULE ON ODD DAYS AND 2 CAPSULES ON EVEN DAYS 45 capsule 2  . Oxycodone HCl 10 MG TABS Take 1 tablet (10 mg total) by mouth every 4 (four) hours as needed (moderate pain). 60 tablet 0  . promethazine (PHENERGAN) 12.5 MG tablet Take 1 tablet (12.5 mg total) by mouth every 6 (six) hours as needed for nausea. 30 tablet 0  . Vitamin D, Ergocalciferol, (DRISDOL) 50000 UNITS CAPS capsule Take 1 capsule (50,000 Units total) by mouth every 7 (seven) days. Takes on Sundays. 30 capsule 11    No results found for this  or any previous visit (from the past 48 hour(s)). No results found.  Review of Systems  Constitutional: Negative.   HENT: Negative.   Eyes: Negative.   Respiratory: Negative.   Cardiovascular: Negative.   Gastrointestinal: Negative.   Genitourinary: Negative.   Musculoskeletal: Positive for myalgias, back pain and joint pain. Negative for falls.  Skin: Negative.   Neurological: Negative.   Endo/Heme/Allergies: Negative.   Psychiatric/Behavioral: Negative.     Blood pressure 128/82, pulse 94, temperature 98.7 F (37.1 C), temperature source Oral, resp. rate 18, height 5\' 3"  (1.6 m), weight 69.854 kg (154 lb), SpO2 96 %. Physical Exam  Constitutional: She is oriented to person, place, and time. She appears  well-developed and well-nourished.  HENT:  Head: Normocephalic and atraumatic.  Right Ear: External ear normal.  Left Ear: External ear normal.  Mouth/Throat: Oropharynx is clear and moist.  Eyes: EOM are normal. Pupils are equal, round, and reactive to light. Left eye exhibits no discharge. Scleral icterus is present.  Neck: Normal range of motion. Neck supple.  Cardiovascular: Normal rate, regular rhythm and normal heart sounds.   Respiratory: Effort normal and breath sounds normal.  GI: Soft. Bowel sounds are normal.  Musculoskeletal: Normal range of motion. She exhibits tenderness. She exhibits no edema.  Neurological: She is alert and oriented to person, place, and time. She has normal reflexes.  Skin: Skin is warm and dry.  Psychiatric: She has a normal mood and affect. Her behavior is normal. Thought content normal.     Assessment/Plan A 34 year old female with sickle cell painful crisis. Next  #1 sickle cell painful crisis: Patient will be admitted to the day hospital. We will start her on IV Dilaudid using PCA pump, Toradol and hydration. The goal is to bring the pain down to tolerable limits then she will resume her oral therapy.  #2 sickle cell anemia: Patient is profoundly jaundiced. We will check her hemoglobin with LDH and total bilirubin. If any evidence of hemolysis axis we will consider transfusing the patient. Next  #3 dehydration: Patient will be hydrated cautiously.  Aaira Oestreicher,LAWAL 10/28/2014, 9:41 AM

## 2014-10-28 NOTE — Telephone Encounter (Signed)
Meds ordered this encounter  Medications  . promethazine (PHENERGAN) 12.5 MG tablet    Sig: Take 1 tablet (12.5 mg total) by mouth every 6 (six) hours as needed for nausea.    Dispense:  30 tablet    Refill:  0  . Oxycodone HCl 10 MG TABS    Sig: Take 1 tablet (10 mg total) by mouth every 4 (four) hours as needed (moderate pain).    Dispense:  60 tablet    Refill:  0  Reviewed [place] Substance Reporting system prior to reorder [name], FNP

## 2014-10-28 NOTE — Telephone Encounter (Signed)
Called patient back and advised that per Dr. Jonelle Sidle she can come to [facility].  Patient verbalizes understanding.

## 2014-10-28 NOTE — Discharge Instructions (Signed)
Sickle Cell Anemia, Adult °Sickle cell anemia is a condition in which red blood cells have an abnormal "sickle" shape. This abnormal shape shortens the cells' life span, which results in a lower than normal concentration of red blood cells in the blood. The sickle shape also causes the cells to clump together and block free blood flow through the blood vessels. As a result, the tissues and organs of the body do not receive enough oxygen. Sickle cell anemia causes organ damage and pain and increases the risk of infection. °CAUSES  °Sickle cell anemia is a genetic disorder. Those who receive two copies of the gene have the condition, and those who receive one copy have the trait. °RISK FACTORS °The sickle cell gene is most common in people whose families originated in [place]. Other areas of the globe where sickle cell trait occurs include the [place], [place] and [place], the [place], and the [place].  °SIGNS AND SYMPTOMS °· Pain, especially in the extremities, back, chest, or abdomen (common). The pain may start suddenly or may develop following an illness, especially if there is dehydration. Pain can also occur due to overexertion or exposure to extreme temperature changes. °· Frequent severe bacterial infections, especially certain types of pneumonia and meningitis. °· Pain and swelling in the hands and feet. °· Decreased activity.   °· Loss of appetite.   °· Change in behavior. °· Headaches. °· Seizures. °· Shortness of breath or difficulty breathing. °· Vision changes. °· Skin ulcers. °Those with the trait may not have symptoms or they may have mild symptoms.  °DIAGNOSIS  °Sickle cell anemia is diagnosed with blood tests that demonstrate the genetic trait. It is often diagnosed during the newborn period, due to mandatory testing nationwide. A variety of blood tests, X-rays, CT scans, MRI scans, ultrasounds, and lung function tests may also be done to monitor the condition. °TREATMENT  °Sickle  cell anemia may be treated with: °· Medicines. You may be given pain medicines, antibiotic medicines (to treat and prevent infections) or medicines to increase the production of certain types of hemoglobin. °· Fluids. °· Oxygen. °· Blood transfusions. °HOME CARE INSTRUCTIONS  °· Drink enough fluid to keep your urine clear or pale yellow. Increase your fluid intake in hot weather and during exercise. °· Do not smoke. Smoking lowers oxygen levels in the blood.   °· Only take over-the-counter or prescription medicines for pain, fever, or discomfort as directed by your health care provider. °· Take antibiotics as directed by your health care provider. Make sure you finish them it even if you start to feel better.   °· Take supplements as directed by your health care provider.   °· Consider wearing a medical alert bracelet. This tells anyone caring for you in an emergency of your condition.   °· When traveling, keep your medical information, health care provider's names, and the medicines you take with you at all times.   °· If you develop a fever, do not take medicines to reduce the fever right away. This could cover up a problem that is developing. Notify your health care provider. °· Keep all follow-up appointments with your health care provider. Sickle cell anemia requires regular medical care. °SEEK MEDICAL CARE IF: ° You have a fever. °SEEK IMMEDIATE MEDICAL CARE IF:  °· You feel dizzy or faint.   °· You have new abdominal pain, especially on the left side near the stomach area.   °· You develop a persistent, often uncomfortable and painful penile erection (priapism). If this is not treated immediately it   will lead to impotence.   °· You have numbness your arms or legs or you have a hard time moving them.   °· You have a hard time with speech.   °· You have a fever or persistent symptoms for more than 2-3 days.   °· You have a fever and your symptoms suddenly get worse.   °· You have signs or symptoms of infection.  These include:   °¨ Chills.   °¨ Abnormal tiredness (lethargy).   °¨ Irritability.   °¨ Poor eating.   °¨ Vomiting.   °· You develop pain that is not helped with medicine.   °· You develop shortness of breath. °· You have pain in your chest.   °· You are coughing up pus-like or bloody sputum.   °· You develop a stiff neck. °· Your feet or hands swell or have pain. °· Your abdomen appears bloated. °· You develop joint pain. °MAKE SURE YOU: °· Understand these instructions. °Document Released: 12/19/2005 Document Revised: 01/25/2014 Document Reviewed: 04/22/2013 °[employer]® Patient Information ©2015 [employer]. This information is not intended to replace advice given to you by your health care provider. Make sure you discuss any questions you have with your health care provider. ° °

## 2014-10-28 NOTE — Progress Notes (Signed)
Patient C/O nausea.  Patient refuses zofran as previous PRN zofran given (see mar) and no improvement per patient.  Patient is requesting phenergan.  MD notified via [place].  RN will continue to monitor

## 2014-10-28 NOTE — Discharge Summary (Signed)
Physician Discharge Summary  Patient ID: Dawn [name] MRN: [id number] DOB/AGE: 1981-01-01 34 y.o.  Admit date: 10/28/2014 Discharge date: 10/28/2014  Admission Diagnoses:  Discharge Diagnoses:  Active Problems:   Sickle cell anemia   Sickle cell anemia with crisis   Discharged Condition: good  Hospital Course: Patient admitted with sickle cell painful crisis. Was treated with IV Dilaudid PCA, Toradol and IV Fluids. Pain went down from 8/10 on admission to 4/10. Patient was feeling better at time of discharge. Has been given her medication refills and will follow up with Dr Zigmund Daniel in the clinic. No other complaints at time of discharge.  Consults: None  Significant Diagnostic Studies: labs: CBC and CMP checked with [facility]. No evidence of hemolysis.  Treatments: IV hydration and analgesia: Dilaudid  Discharge Exam: Blood pressure 136/81, pulse 81, temperature 97.5 F (36.4 C), temperature source Oral, resp. rate 11, height 5\' 3"  (1.6 m), weight 69.854 kg (154 lb), SpO2 91 %. General appearance: alert, cooperative and no distress Head: Normocephalic, without obvious abnormality, atraumatic Neck: no adenopathy, no carotid bruit, no JVD, supple, symmetrical, trachea midline and thyroid not enlarged, symmetric, no tenderness/mass/nodules Back: symmetric, no curvature. ROM normal. No CVA tenderness. Resp: clear to auscultation bilaterally Chest wall: no tenderness Cardio: regular rate and rhythm, S1, S2 normal, no murmur, click, rub or gallop GI: soft, non-tender; bowel sounds normal; no masses,  no organomegaly Extremities: extremities normal, atraumatic, no cyanosis or edema Pulses: 2+ and symmetric Skin: Skin color, texture, turgor normal. No rashes or lesions Neurologic: Grossly normal  Disposition: 01-Home or Self Care     Medication List    TAKE these medications        cetirizine 10 MG tablet  Commonly known as:  ZYRTEC  Take 10 mg by mouth daily.     folic acid 1 MG  tablet  Commonly known as:  FOLVITE  Take 1 tablet (1 mg total) by mouth daily.     hydroxyurea 500 MG capsule  Commonly known as:  HYDREA  TAKE 1 CAPSULE ON ODD DAYS AND 2 CAPSULES ON EVEN DAYS     Oxycodone HCl 10 MG Tabs  Take 1 tablet (10 mg total) by mouth every 4 (four) hours as needed (moderate pain).     promethazine 12.5 MG tablet  Commonly known as:  PHENERGAN  Take 1 tablet (12.5 mg total) by mouth every 6 (six) hours as needed for nausea.     Vitamin D (Ergocalciferol) 50000 UNITS Caps capsule  Commonly known as:  DRISDOL  Take 1 capsule (50,000 Units total) by mouth every 7 (seven) days. Takes on Sundays.         SignedBarbette Merino 10/28/2014, 5:41 PM

## 2014-10-28 NOTE — Telephone Encounter (Signed)
Patient C/O pain to back and legs that she rates 7/10.  Patient states she has taken Oxycodone short acting around 0600.  Patient denies chest pain/shortness of breath, patient denies N/V/D or fever.  I explained I would notify the physician and giver her a call back.  Patient verbalizes understanding.

## 2014-10-28 NOTE — Progress Notes (Signed)
Patient ID: Dawn [name], female   DOB: [birth year], 34 y.o.   MRN: [id number] Discharge instructions given to patient, patient rating pain currently 4/10 on pain scale.  Patient verbalizes that she is able to manage pain with home medications.  IV removed without difficulty.  Patient A&O at discharge.  Ambulatory out of [facility].  Family here for transport.

## 2014-10-28 NOTE — Telephone Encounter (Signed)
Phenergan processed electronically to pharmacy / [facility] 03/02/2014 / Please refill oxycodone 10mg  q4hr / Patient educated about prescription refill policy and pick up days

## 2014-10-31 ENCOUNTER — Encounter (HOSPITAL_COMMUNITY): Payer: Self-pay | Admitting: Emergency Medicine

## 2014-10-31 ENCOUNTER — Emergency Department (HOSPITAL_COMMUNITY)
Admission: EM | Admit: 2014-10-31 | Discharge: 2014-10-31 | Disposition: A | Discharge status: Home / Self Care | Payer: BLUE CROSS/BLUE SHIELD | Attending: Emergency Medicine | Admitting: Emergency Medicine

## 2014-10-31 DIAGNOSIS — R102 Pelvic and perineal pain: Secondary | ICD-10-CM | POA: Diagnosis present

## 2014-10-31 DIAGNOSIS — Z91040 Latex allergy status: Secondary | ICD-10-CM | POA: Diagnosis not present

## 2014-10-31 DIAGNOSIS — Z79899 Other long term (current) drug therapy: Secondary | ICD-10-CM | POA: Diagnosis not present

## 2014-10-31 DIAGNOSIS — D5700 Hb-SS disease with crisis, unspecified: Secondary | ICD-10-CM | POA: Diagnosis not present

## 2014-10-31 DIAGNOSIS — Z8659 Personal history of other mental and behavioral disorders: Secondary | ICD-10-CM | POA: Insufficient documentation

## 2014-10-31 DIAGNOSIS — D582 Other hemoglobinopathies: Secondary | ICD-10-CM | POA: Diagnosis not present

## 2014-10-31 DIAGNOSIS — Z3202 Encounter for pregnancy test, result negative: Secondary | ICD-10-CM | POA: Insufficient documentation

## 2014-10-31 DIAGNOSIS — Z8701 Personal history of pneumonia (recurrent): Secondary | ICD-10-CM | POA: Insufficient documentation

## 2014-10-31 DIAGNOSIS — R17 Unspecified jaundice: Secondary | ICD-10-CM | POA: Diagnosis not present

## 2014-10-31 LAB — CBC WITH DIFFERENTIAL/PLATELET
BAND NEUTROPHILS: 0 % (ref 0–10)
BASOS ABS: 0 10*3/uL (ref 0.0–0.1)
Basophils Relative: 0 % (ref 0–1)
Blasts: 0 %
Eosinophils Absolute: 0.1 10*3/uL (ref 0.0–0.7)
Eosinophils Relative: 1 % (ref 0–5)
HCT: 21.9 % — ABNORMAL LOW (ref 36.0–46.0)
Hemoglobin: 7.8 g/dL — ABNORMAL LOW (ref 12.0–15.0)
LYMPHS PCT: 33 % (ref 12–46)
Lymphs Abs: 2.5 10*3/uL (ref 0.7–4.0)
MCH: 35.8 pg — ABNORMAL HIGH (ref 26.0–34.0)
MCHC: 35.6 g/dL (ref 30.0–36.0)
MCV: 100.5 fL — ABNORMAL HIGH (ref 78.0–100.0)
Metamyelocytes Relative: 0 %
Monocytes Absolute: 1.5 10*3/uL — ABNORMAL HIGH (ref 0.1–1.0)
Monocytes Relative: 20 % — ABNORMAL HIGH (ref 3–12)
Myelocytes: 0 %
Neutro Abs: 3.4 10*3/uL (ref 1.7–7.7)
Neutrophils Relative %: 46 % (ref 43–77)
PROMYELOCYTES ABS: 0 %
Platelets: 229 10*3/uL (ref 150–400)
RBC: 2.18 MIL/uL — ABNORMAL LOW (ref 3.87–5.11)
RDW: 21.9 % — ABNORMAL HIGH (ref 11.5–15.5)
WBC: 7.5 10*3/uL (ref 4.0–10.5)
nRBC: 6 /100 WBC — ABNORMAL HIGH

## 2014-10-31 LAB — URINE MICROSCOPIC-ADD ON

## 2014-10-31 LAB — BASIC METABOLIC PANEL
BUN: 6 mg/dL (ref 6–23)
CHLORIDE: 112 mmol/L (ref 96–112)
CO2: 23 mmol/L (ref 19–32)
CREATININE: 0.4 mg/dL — AB (ref 0.50–1.10)
Calcium: 8.2 mg/dL — ABNORMAL LOW (ref 8.4–10.5)
GFR calc Af Amer: >90 mL/min (ref >90)
GFR calc non Af Amer: >90 mL/min (ref >90)
Glucose, Bld: 75 mg/dL (ref 70–99)
Potassium: 3.6 mmol/L (ref 3.5–5.1)
Sodium: 137 mmol/L (ref 135–145)

## 2014-10-31 LAB — URINALYSIS, ROUTINE W REFLEX MICROSCOPIC
Glucose, UA: NEGATIVE mg/dL
Ketones, ur: NEGATIVE mg/dL
Leukocytes, UA: NEGATIVE
Nitrite: NEGATIVE
SPECIFIC GRAVITY, URINE: 1.02 (ref 1.005–1.030)
Urobilinogen, UA: 4 mg/dL — ABNORMAL HIGH (ref 0.0–1.0)
pH: 6.5 (ref 5.0–8.0)

## 2014-10-31 LAB — RETICULOCYTES
RBC.: 2.18 MIL/uL — AB (ref 3.87–5.11)
RETIC CT PCT: 31.6 % — AB (ref 0.4–3.1)
Retic Count, Absolute: 688.9 10*3/uL — ABNORMAL HIGH (ref 19.0–186.0)

## 2014-10-31 LAB — POC URINE PREG, ED: PREG TEST UR: NEGATIVE

## 2014-10-31 LAB — LACTATE DEHYDROGENASE: LDH: 317 U/L — ABNORMAL HIGH (ref 94–250)

## 2014-10-31 MED ORDER — SODIUM CHLORIDE 0.9 % IV SOLN
INTRAVENOUS | Status: DC
  Administered 2014-10-31: 17:00:00 via INTRAVENOUS

## 2014-10-31 MED ORDER — FENTANYL CITRATE 0.05 MG/ML IJ SOLN
100.0000 ug | INTRAMUSCULAR | Status: AC | PRN
  Administered 2014-10-31 (×4): 100 ug via INTRAVENOUS
  Filled 2014-10-31 (×4): qty 2

## 2014-10-31 MED ORDER — PROMETHAZINE HCL 25 MG/ML IJ SOLN
12.5000 mg | INTRAMUSCULAR | Status: DC | PRN
  Administered 2014-10-31: 12.5 mg via INTRAVENOUS
  Filled 2014-10-31: qty 1

## 2014-10-31 MED ORDER — FENTANYL CITRATE 0.05 MG/ML IJ SOLN
100.0000 ug | INTRAMUSCULAR | Status: DC | PRN
  Administered 2014-10-31: 100 ug via INTRAVENOUS
  Filled 2014-10-31: qty 2

## 2014-10-31 NOTE — ED Notes (Signed)
Patient c/o right side pelvic pain. Denies any discharge, pressure, bleeding, or urinary symptoms. Per patient "pelvic pain is causing her sickle cell pain to increase." Patient seen at sickle cell clinic on Thursday and told to come here if pain worsened.

## 2014-10-31 NOTE — Discharge Instructions (Signed)
Sickle Cell Anemia, Adult °Sickle cell anemia is a condition in which red blood cells have an abnormal "sickle" shape. This abnormal shape shortens the cells' life span, which results in a lower than normal concentration of red blood cells in the blood. The sickle shape also causes the cells to clump together and block free blood flow through the blood vessels. As a result, the tissues and organs of the body do not receive enough oxygen. Sickle cell anemia causes organ damage and pain and increases the risk of infection. °CAUSES  °Sickle cell anemia is a genetic disorder. Those who receive two copies of the gene have the condition, and those who receive one copy have the trait. °RISK FACTORS °The sickle cell gene is most common in people whose families originated in [place]. Other areas of the globe where sickle cell trait occurs include the [place], [place] and [place], the [place], and the [place].  °SIGNS AND SYMPTOMS °· Pain, especially in the extremities, back, chest, or abdomen (common). The pain may start suddenly or may develop following an illness, especially if there is dehydration. Pain can also occur due to overexertion or exposure to extreme temperature changes. °· Frequent severe bacterial infections, especially certain types of pneumonia and meningitis. °· Pain and swelling in the hands and feet. °· Decreased activity.   °· Loss of appetite.   °· Change in behavior. °· Headaches. °· Seizures. °· Shortness of breath or difficulty breathing. °· Vision changes. °· Skin ulcers. °Those with the trait may not have symptoms or they may have mild symptoms.  °DIAGNOSIS  °Sickle cell anemia is diagnosed with blood tests that demonstrate the genetic trait. It is often diagnosed during the newborn period, due to mandatory testing nationwide. A variety of blood tests, X-rays, CT scans, MRI scans, ultrasounds, and lung function tests may also be done to monitor the condition. °TREATMENT  °Sickle  cell anemia may be treated with: °· Medicines. You may be given pain medicines, antibiotic medicines (to treat and prevent infections) or medicines to increase the production of certain types of hemoglobin. °· Fluids. °· Oxygen. °· Blood transfusions. °HOME CARE INSTRUCTIONS  °· Drink enough fluid to keep your urine clear or pale yellow. Increase your fluid intake in hot weather and during exercise. °· Do not smoke. Smoking lowers oxygen levels in the blood.   °· Only take over-the-counter or prescription medicines for pain, fever, or discomfort as directed by your health care provider. °· Take antibiotics as directed by your health care provider. Make sure you finish them it even if you start to feel better.   °· Take supplements as directed by your health care provider.   °· Consider wearing a medical alert bracelet. This tells anyone caring for you in an emergency of your condition.   °· When traveling, keep your medical information, health care provider's names, and the medicines you take with you at all times.   °· If you develop a fever, do not take medicines to reduce the fever right away. This could cover up a problem that is developing. Notify your health care provider. °· Keep all follow-up appointments with your health care provider. Sickle cell anemia requires regular medical care. °SEEK MEDICAL CARE IF: ° You have a fever. °SEEK IMMEDIATE MEDICAL CARE IF:  °· You feel dizzy or faint.   °· You have new abdominal pain, especially on the left side near the stomach area.   °· You develop a persistent, often uncomfortable and painful penile erection (priapism). If this is not treated immediately it   will lead to impotence.   °· You have numbness your arms or legs or you have a hard time moving them.   °· You have a hard time with speech.   °· You have a fever or persistent symptoms for more than 2-3 days.   °· You have a fever and your symptoms suddenly get worse.   °· You have signs or symptoms of infection.  These include:   °¨ Chills.   °¨ Abnormal tiredness (lethargy).   °¨ Irritability.   °¨ Poor eating.   °¨ Vomiting.   °· You develop pain that is not helped with medicine.   °· You develop shortness of breath. °· You have pain in your chest.   °· You are coughing up pus-like or bloody sputum.   °· You develop a stiff neck. °· Your feet or hands swell or have pain. °· Your abdomen appears bloated. °· You develop joint pain. °MAKE SURE YOU: °· Understand these instructions. °Document Released: 12/19/2005 Document Revised: 01/25/2014 Document Reviewed: 04/22/2013 °[employer]® Patient Information ©2015 [employer]. This information is not intended to replace advice given to you by your health care provider. Make sure you discuss any questions you have with your health care provider. ° °

## 2014-10-31 NOTE — ED Provider Notes (Signed)
CSN: [id number]     Arrival date & time 10/31/14  1509 History  This chart was scribed for Richarda Blade, MD by Forrestine Him, ED Scribe. This patient was seen in room APA08/[facility]08 and the patient's care was started 3:54 PM.   Chief Complaint  Patient presents with  . Pelvic Pain   The history is provided by the patient. No language interpreter was used.   HPI Comments: Dawn [name] is a 34 y.o. female with a PMHx of sickle cell disease type S who presents to the Emergency Department complaining of constant, moderate pelvic pain x 3 days that has progressively worsened today. Pain is described as sharp and exacerbated with palpation. She also reports constant, moderate back pain and leg pain she attributes to her history of sickle cell disease. Pt was seen at the sickle cell clinic 3 days ago for most recent crisis. Pt was given medication for pain control and sent home. She has tried prescribed pain medications for break through pain without any improvement for symptoms. No recent fever or chills. Pt with known allergies to Lisinopril and Desferal. Patient denies associated vaginal discharge.   Past Medical History  Diagnosis Date  . Sickle cell disease   . Sickle cell disease, type S   . Blood transfusion   . 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 HCAP (healthcare-associated pneumonia) 05/19/2013  . Sickle cell anemia    Past Surgical History  Procedure Laterality Date  . Cholecystectomy    .  left knee acl reconstruction    . Cesarean section      x 2  . Portacath placement    . Portacath placement Left 02/23/2013    Procedure: INSERTION PORT-A-CATH;  Surgeon: Donato Heinz, MD;  Location: [facility];  Service: General;  Laterality: Left;  . Port-a-cath removal Left 07/29/2013    Procedure: REMOVAL PORT-A-CATH;  Surgeon: Scherry Ran, MD;  Location: [facility];  Service: General;  Laterality: Left;  . Tee without cardioversion N/A 08/07/2013    Procedure: TRANSESOPHAGEAL  ECHOCARDIOGRAM (TEE);  Surgeon: Arnoldo Lenis, MD;  Location: [facility];  Service: Cardiology;  Laterality: N/A;   Family History  Problem Relation Age of Onset  . Sickle cell trait Mother   . Sickle cell trait Father   . Hypertension Father   . Diabetes Father   . Sickle cell trait Sister   . Cancer Paternal Aunt     Breast   History  Substance Use Topics  . Smoking status: Never Smoker   . Smokeless tobacco: Former Systems developer  . Alcohol Use: No   OB History    Gravida Para Term Preterm AB TAB SAB Ectopic Multiple Living   1 1  1      1      Review of Systems  Constitutional: Negative for fever and chills.  Genitourinary: Positive for pelvic pain.  Musculoskeletal: Positive for back pain and arthralgias.  All other systems reviewed and are negative.  Allergies  Desferal; Latex; Lisinopril; and Tape  Home Medications   Prior to Admission medications   Medication Sig Start Date End Date Taking? Authorizing Provider  cetirizine (ZYRTEC) 10 MG tablet Take 10 mg by mouth daily.   Yes Historical Provider, MD  folic acid (FOLVITE) 1 MG tablet Take 1 tablet (1 mg total) by mouth daily. 03/31/14  Yes [name], FNP  hydroxyurea (HYDREA) 500 MG capsule TAKE 1 CAPSULE ON ODD DAYS AND 2 CAPSULES ON EVEN DAYS 09/02/14  Yes [name], FNP  Oxycodone HCl 10 MG TABS Take 1 tablet (10 mg total) by mouth every 4 (four) hours as needed (moderate pain). 10/28/14  Yes [name], FNP  promethazine (PHENERGAN) 12.5 MG tablet Take 1 tablet (12.5 mg total) by mouth every 6 (six) hours as needed for nausea. 10/28/14  Yes Leana Gamer, MD  Pseudoeph-Doxylamine-DM-APAP (DAYQUIL/NYQUIL COLD/FLU RELIEF PO) Take 15 mLs by mouth 2 (two) times daily as needed (for cold/symptoms).   Yes Historical Provider, MD  Vitamin D, Ergocalciferol, (DRISDOL) 50000 UNITS CAPS capsule Take 1 capsule (50,000 Units total) by mouth every 7 (seven) days. Takes on Sundays. 03/31/14  Yes [name],  FNP   Triage Vitals: BP 138/79 mmHg  Pulse 98  Temp(Src) 98.7 F (37.1 C) (Oral)  Resp 18  Ht 5\' 3"  (1.6 m)  Wt 153 lb (69.4 kg)  BMI 27.11 kg/m2  SpO2 95%   Physical Exam  Constitutional: She is oriented to person, place, and time. She appears well-developed and well-nourished. No distress.  HENT:  Head: Normocephalic and atraumatic.  Eyes: EOM are normal. Scleral icterus is present.  Neck: Normal range of motion.  Cardiovascular: Normal rate, regular rhythm and normal heart sounds.   Pulmonary/Chest: Effort normal and breath sounds normal.  Abdominal: Soft. She exhibits no distension. There is no tenderness.  Genitourinary:  Normal external female genitalia. Thick opaque white vaginal discharge. Cervix could not be visualized  Due to patient discomfort. Wet prep and GC chlamydia probe taken from vaginal secretions. On bimanual examination, cervix was non tender. Uterus is not enlarged. Tenderness to left adenexal. No adenexal mass noted to left side. No adenexal mass to right side noted. Ovaries are not palpable.  Musculoskeletal: Normal range of motion.  Tenderness to palpation over L suprapubic region  Neurological: She is alert and oriented to person, place, and time.  Skin: Skin is warm and dry.  Psychiatric: She has a normal mood and affect. Judgment normal.  Nursing note and vitals reviewed.  ED Course  Procedures (including critical care time)  DIAGNOSTIC STUDIES: Oxygen Saturation is 95% on RA, adequate  by my interpretation.    COORDINATION OF CARE: Medications  0.9 %  sodium chloride infusion ( Intravenous New Bag/Given 10/31/14 1639)  fentaNYL (SUBLIMAZE) injection 100 mcg (100 mcg Intravenous Given 10/31/14 1640)  promethazine (PHENERGAN) injection 12.5 mg (12.5 mg Intravenous Given 10/31/14 1640)   Patient Vitals for the past 24 hrs:  BP Temp Temp src Pulse Resp SpO2 Height Weight  10/31/14 1511 138/79 mmHg 98.7 F (37.1 C) Oral 98 18 95 % 5\' 3"  (1.6 m) 153 lb  (69.4 kg)   3:57 PM- Will perform pelvic examination and order urinalysis. Discussed treatment plan with pt at bedside and pt agreed to plan.    4:59 PM- Performed pelvic exam.   7:37 PM- Patient currently rates her pain at 6/10. She states that she has had 3 doses of medication thus far. Patient expressed interest in being discharged.  21:00- is comfortable at this time, and like to go home.  Her pain is currently 4/10.  Labs Review Labs Reviewed  URINALYSIS, ROUTINE W REFLEX MICROSCOPIC - Abnormal; Notable for the following:    Hgb urine dipstick LARGE (*)    Bilirubin Urine MODERATE (*)    Protein, ur >300 (*)    Urobilinogen, UA 4.0 (*)    All other components within normal limits  URINE MICROSCOPIC-ADD ON - Abnormal; Notable for the following:  Squamous Epithelial / LPF FEW (*)    Bacteria, UA FEW (*)    All other components within normal limits  BASIC METABOLIC PANEL - Abnormal; Notable for the following:    Creatinine, Ser 0.40 (*)    Calcium 8.2 (*)    All other components within normal limits  CBC WITH DIFFERENTIAL/PLATELET - Abnormal; Notable for the following:    RBC 2.18 (*)    Hemoglobin 7.8 (*)    HCT 21.9 (*)    MCV 100.5 (*)    MCH 35.8 (*)    RDW 21.9 (*)    Monocytes Relative 20 (*)    nRBC 6 (*)    Monocytes Absolute 1.5 (*)    All other components within normal limits  RETICULOCYTES - Abnormal; Notable for the following:    Retic Ct Pct 31.6 (*)    RBC. 2.18 (*)    Retic Count, Manual 688.9 (*)    All other components within normal limits  LACTATE DEHYDROGENASE - Abnormal; Notable for the following:    LDH 317 (*)    All other components within normal limits  URINE CULTURE  WET PREP, GENITAL  RPR  HIV ANTIBODY (ROUTINE TESTING)  POC URINE PREG, ED  GC/CHLAMYDIA PROBE AMP ([place])   Imaging Review No results found.   EKG Interpretation None     MDM   Final diagnoses:  Sickle cell crisis  Other hemoglobinopathies    Sickle  cell crisis, without clear cause.  Doubt serious bacterial infection.  Patient's symptoms are controlled in the ED.  She states her baseline hemoglobin for transfusion, is 5.  She is well above that today.  She has mild scleral icterus and elevated bilirubin.  She is nontoxic and there is no suggestion of metabolic instability or impending vascular collapse.  Nursing Notes Reviewed/ Care Coordinated Applicable Imaging Reviewed Interpretation of Laboratory Data incorporated into ED treatment  The patient appears reasonably screened and/or stabilized for discharge and I doubt any other medical condition or other [facility] requiring further screening, evaluation, or treatment in the ED at this time prior to discharge.  Plan: Home Medications- usual; Home Treatments- rest; return here if the recommended treatment, does not improve the symptoms; Recommended follow up- Call PCP in AM to discuss ongoing treatment plan\  I personally performed the services described in this documentation, which was scribed in my presence. The recorded information has been reviewed and is accurate.     Richarda Blade, MD 10/31/14 2103

## 2014-10-31 NOTE — ED Notes (Signed)
Patient verbalizes understanding of discharge instructions, home care and follow up care. Patient ambulatory out of department at this time with family. 

## 2014-11-01 LAB — GC/CHLAMYDIA PROBE AMP (~~LOC~~) NOT AT ARMC
CHLAMYDIA, DNA PROBE: NEGATIVE
NEISSERIA GONORRHEA: NEGATIVE

## 2014-11-02 LAB — RPR: RPR Ser Ql: NONREACTIVE

## 2014-11-02 LAB — URINE CULTURE
Colony Count: NO GROWTH
Culture: NO GROWTH

## 2014-11-02 LAB — HIV ANTIBODY (ROUTINE TESTING W REFLEX): HIV Screen 4th Generation wRfx: NONREACTIVE

## 2014-11-24 ENCOUNTER — Encounter (HOSPITAL_COMMUNITY): Payer: Self-pay | Admitting: Emergency Medicine

## 2014-11-24 ENCOUNTER — Emergency Department (HOSPITAL_COMMUNITY)
Admission: EM | Admit: 2014-11-24 | Discharge: 2014-11-24 | Disposition: A | Discharge status: Home / Self Care | Payer: BLUE CROSS/BLUE SHIELD | Attending: Emergency Medicine | Admitting: Emergency Medicine

## 2014-11-24 DIAGNOSIS — D5700 Hb-SS disease with crisis, unspecified: Secondary | ICD-10-CM | POA: Insufficient documentation

## 2014-11-24 DIAGNOSIS — Z3202 Encounter for pregnancy test, result negative: Secondary | ICD-10-CM | POA: Diagnosis not present

## 2014-11-24 DIAGNOSIS — Z8659 Personal history of other mental and behavioral disorders: Secondary | ICD-10-CM | POA: Diagnosis not present

## 2014-11-24 DIAGNOSIS — Z79899 Other long term (current) drug therapy: Secondary | ICD-10-CM | POA: Diagnosis not present

## 2014-11-24 DIAGNOSIS — Z8701 Personal history of pneumonia (recurrent): Secondary | ICD-10-CM | POA: Diagnosis not present

## 2014-11-24 DIAGNOSIS — Z91040 Latex allergy status: Secondary | ICD-10-CM | POA: Insufficient documentation

## 2014-11-24 LAB — CBC WITH DIFFERENTIAL/PLATELET
BASOS PCT: 0 % (ref 0–1)
Basophils Absolute: 0 10*3/uL (ref 0.0–0.1)
EOS PCT: 1 % (ref 0–5)
Eosinophils Absolute: 0.1 10*3/uL (ref 0.0–0.7)
HCT: 22.9 % — ABNORMAL LOW (ref 36.0–46.0)
HEMOGLOBIN: 8.3 g/dL — AB (ref 12.0–15.0)
Lymphocytes Relative: 36 % (ref 12–46)
Lymphs Abs: 3 10*3/uL (ref 0.7–4.0)
MCH: 37.7 pg — ABNORMAL HIGH (ref 26.0–34.0)
MCHC: 36.2 g/dL — ABNORMAL HIGH (ref 30.0–36.0)
MCV: 104.1 fL — ABNORMAL HIGH (ref 78.0–100.0)
MONOS PCT: 16 % — AB (ref 3–12)
Monocytes Absolute: 1.3 10*3/uL — ABNORMAL HIGH (ref 0.1–1.0)
NEUTROS PCT: 47 % (ref 43–77)
Neutro Abs: 3.8 10*3/uL (ref 1.7–7.7)
Platelets: 195 10*3/uL (ref 150–400)
RBC: 2.2 MIL/uL — AB (ref 3.87–5.11)
RDW: 22.6 % — ABNORMAL HIGH (ref 11.5–15.5)
WBC: 8.2 10*3/uL (ref 4.0–10.5)

## 2014-11-24 LAB — URINE MICROSCOPIC-ADD ON

## 2014-11-24 LAB — COMPREHENSIVE METABOLIC PANEL
ALT: 36 U/L — AB (ref 0–35)
AST: 94 U/L — ABNORMAL HIGH (ref 0–37)
Albumin: 3.5 g/dL (ref 3.5–5.2)
Alkaline Phosphatase: 141 U/L — ABNORMAL HIGH (ref 39–117)
Anion gap: 8 (ref 5–15)
BUN: 8 mg/dL (ref 6–23)
CHLORIDE: 107 mmol/L (ref 96–112)
CO2: 24 mmol/L (ref 19–32)
Calcium: 9.1 mg/dL (ref 8.4–10.5)
Creatinine, Ser: 0.32 mg/dL — ABNORMAL LOW (ref 0.50–1.10)
GFR calc Af Amer: >90 mL/min (ref >90)
GLUCOSE: 74 mg/dL (ref 70–99)
Potassium: 4.3 mmol/L (ref 3.5–5.1)
Sodium: 139 mmol/L (ref 135–145)
Total Bilirubin: 9.3 mg/dL — ABNORMAL HIGH (ref 0.3–1.2)
Total Protein: 8.1 g/dL (ref 6.0–8.3)

## 2014-11-24 LAB — URINALYSIS, ROUTINE W REFLEX MICROSCOPIC
Glucose, UA: NEGATIVE mg/dL
Ketones, ur: NEGATIVE mg/dL
Leukocytes, UA: NEGATIVE
Nitrite: NEGATIVE
Protein, ur: 100 mg/dL — AB
SPECIFIC GRAVITY, URINE: 1.02 (ref 1.005–1.030)
Urobilinogen, UA: 0.2 mg/dL (ref 0.0–1.0)
pH: 5.5 (ref 5.0–8.0)

## 2014-11-24 LAB — PREGNANCY, URINE: PREG TEST UR: NEGATIVE

## 2014-11-24 LAB — RETICULOCYTES
RBC.: 2.2 MIL/uL — AB (ref 3.87–5.11)
Retic Ct Pct: >23 % — ABNORMAL HIGH (ref 0.4–3.1)

## 2014-11-24 MED ORDER — SODIUM CHLORIDE 0.9 % IV SOLN
INTRAVENOUS | Status: DC
  Administered 2014-11-24: 17:00:00 via INTRAVENOUS

## 2014-11-24 MED ORDER — HYDROMORPHONE HCL 1 MG/ML IJ SOLN
1.0000 mg | INTRAMUSCULAR | Status: AC | PRN
  Administered 2014-11-24 (×3): 1 mg via INTRAVENOUS
  Filled 2014-11-24 (×3): qty 1

## 2014-11-24 MED ORDER — PROMETHAZINE HCL 25 MG/ML IJ SOLN
12.5000 mg | Freq: Once | INTRAMUSCULAR | Status: AC
  Administered 2014-11-24: 12.5 mg via INTRAVENOUS
  Filled 2014-11-24: qty 1

## 2014-11-24 NOTE — ED Notes (Signed)
Pt alert & oriented x4, stable gait. Patient given discharge instructions, paperwork & prescription(s). Patient  instructed to stop at the registration desk to finish any additional paperwork. Patient verbalized understanding. Pt left department w/ no further questions. 

## 2014-11-24 NOTE — ED Notes (Signed)
Pt reports onset of sickle cell pain in her arms and legs yesterday.

## 2014-11-24 NOTE — ED Provider Notes (Signed)
CSN: [id number]     Arrival date & time 11/24/14  1426 History   First MD Initiated Contact with Patient 11/24/14 1627     Chief Complaint  Patient presents with  . Sickle Cell Pain Crisis      HPI Pt was seen at 1650.  Per pt, c/o gradual onset and persistence of constant "sickle cell pain" in her arms and legs that began yesterday. Describes her pain as per her usual sickle cell crisis pain pattern. Pt states she has been taking her usual pain meds without improvement. Denies fevers, no rash, no abd pain, no N/V/D, no CP/SOB, no focal motor weakness, no tingling/numbness in extremities.    Past Medical History  Diagnosis Date  . Sickle cell disease   . Sickle cell disease, type S   . Blood transfusion   . 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 HCAP (healthcare-associated pneumonia) 05/19/2013  . Sickle cell anemia    Past Surgical History  Procedure Laterality Date  . Cholecystectomy    .  left knee acl reconstruction    . Cesarean section      x 2  . Portacath placement    . Portacath placement Left 02/23/2013    Procedure: INSERTION PORT-A-CATH;  Surgeon: Donato Heinz, MD;  Location: [facility];  Service: General;  Laterality: Left;  . Port-a-cath removal Left 07/29/2013    Procedure: REMOVAL PORT-A-CATH;  Surgeon: Scherry Ran, MD;  Location: [facility];  Service: General;  Laterality: Left;  . Tee without cardioversion N/A 08/07/2013    Procedure: TRANSESOPHAGEAL ECHOCARDIOGRAM (TEE);  Surgeon: Arnoldo Lenis, MD;  Location: [facility];  Service: Cardiology;  Laterality: N/A;   Family History  Problem Relation Age of Onset  . Sickle cell trait Mother   . Sickle cell trait Father   . Hypertension Father   . Diabetes Father   . Sickle cell trait Sister   . Cancer Paternal Aunt     Breast   History  Substance Use Topics  . Smoking status: Never Smoker   . Smokeless tobacco: Former Systems developer  . Alcohol Use: No   OB History    Gravida Para Term Preterm AB TAB  SAB Ectopic Multiple Living   1 1  1      1      Review of Systems ROS: Statement: All systems negative except as marked or noted in the HPI; Constitutional: Negative for fever and chills. ; ; Eyes: Negative for eye pain, redness and discharge. ; ; ENMT: Negative for ear pain, hoarseness, nasal congestion, sinus pressure and sore throat. ; ; Cardiovascular: Negative for chest pain, palpitations, diaphoresis, dyspnea and peripheral edema. ; ; Respiratory: Negative for cough, wheezing and stridor. ; ; Gastrointestinal: Negative for nausea, vomiting, diarrhea, abdominal pain, blood in stool, hematemesis, jaundice and rectal bleeding. . ; ; Genitourinary: Negative for dysuria, flank pain and hematuria. ; ; Musculoskeletal: +"arms and legs pain." Negative for back pain and neck pain. Negative for swelling and trauma.; ; Skin: Negative for pruritus, rash, abrasions, blisters, bruising and skin lesion.; ; Neuro: Negative for headache, lightheadedness and neck stiffness. Negative for weakness, altered level of consciousness , altered mental status, extremity weakness, paresthesias, involuntary movement, seizure and syncope.      Allergies  Desferal; Latex; Lisinopril; and Tape  Home Medications   Prior to Admission medications   Medication Sig Start Date End Date Taking? Authorizing Provider  cetirizine (ZYRTEC) 10 MG tablet Take 10 mg by mouth daily.  Yes Historical Provider, MD  folic acid (FOLVITE) 1 MG tablet Take 1 tablet (1 mg total) by mouth daily. 03/31/14  Yes [name], FNP  hydroxyurea (HYDREA) 500 MG capsule TAKE 1 CAPSULE ON ODD DAYS AND 2 CAPSULES ON EVEN DAYS 09/02/14  Yes [name], FNP  Oxycodone HCl 10 MG TABS Take 1 tablet (10 mg total) by mouth every 4 (four) hours as needed (moderate pain). 10/28/14  Yes [name], FNP  promethazine (PHENERGAN) 12.5 MG tablet Take 1 tablet (12.5 mg total) by mouth every 6 (six) hours as needed for nausea. 10/28/14  Yes Leana Gamer, MD  Vitamin D, Ergocalciferol, (DRISDOL) 50000 UNITS CAPS capsule Take 1 capsule (50,000 Units total) by mouth every 7 (seven) days. Takes on Sundays. 03/31/14  Yes [name], FNP   BP 128/71 mmHg  Pulse 100  Temp(Src) 99 F (37.2 C) (Oral)  Resp 18  Ht 5\' 3"  (1.6 m)  Wt 154 lb (69.854 kg)  BMI 27.29 kg/m2  SpO2 94% Physical Exam  1655: Physical examination:  Nursing notes reviewed; Vital signs and O2 SAT reviewed;  Constitutional: Well developed, Well nourished, Well hydrated, In no acute distress; Head:  Normocephalic, atraumatic; Eyes: EOMI, PERRL, No scleral icterus; ENMT: Mouth and pharynx normal, Mucous membranes moist; Neck: Supple, Full range of motion, No lymphadenopathy; Cardiovascular: Regular rate and rhythm, No gallop; Respiratory: Breath sounds clear & equal bilaterally, No wheezes.  Speaking full sentences with ease, Normal respiratory effort/excursion; Chest: Nontender, Movement normal; Abdomen: Soft, Nontender, Nondistended, Normal bowel sounds; Genitourinary: No CVA tenderness; Extremities: Pulses normal, No tenderness, No edema, No calf edema or asymmetry.; Neuro: AA&Ox3, Major CN grossly intact.  Speech clear. No gross focal motor or sensory deficits in extremities.; Skin: Color normal, Warm, Dry.   ED Course  Procedures     EKG Interpretation None      MDM  MDM Reviewed: previous chart, nursing note and vitals Reviewed previous: labs Interpretation: labs     Results for orders placed or performed during the hospital encounter of 11/24/14  CBC WITH DIFFERENTIAL  Result Value Ref Range   WBC 8.2 4.0 - 10.5 K/uL   RBC 2.20 (L) 3.87 - 5.11 MIL/uL   Hemoglobin 8.3 (L) 12.0 - 15.0 g/dL   HCT 22.9 (L) 36.0 - 46.0 %   MCV 104.1 (H) 78.0 - 100.0 fL   MCH 37.7 (H) 26.0 - 34.0 pg   MCHC 36.2 (H) 30.0 - 36.0 g/dL   RDW 22.6 (H) 11.5 - 15.5 %   Platelets 195 150 - 400 K/uL   Neutrophils Relative % 47 43 - 77 %   Lymphocytes Relative 36 12 - 46 %    Monocytes Relative 16 (H) 3 - 12 %   Eosinophils Relative 1 0 - 5 %   Basophils Relative 0 0 - 1 %   Neutro Abs 3.8 1.7 - 7.7 K/uL   Lymphs Abs 3.0 0.7 - 4.0 K/uL   Monocytes Absolute 1.3 (H) 0.1 - 1.0 K/uL   Eosinophils Absolute 0.1 0.0 - 0.7 K/uL   Basophils Absolute 0.0 0.0 - 0.1 K/uL   RBC Morphology POLYCHROMASIA PRESENT    WBC Morphology WHITE COUNT CONFIRMED ON SMEAR    Smear Review PLATELET COUNT CONFIRMED BY SMEAR   Comprehensive metabolic panel  Result Value Ref Range   Sodium 139 135 - 145 mmol/L   Potassium 4.3 3.5 - 5.1 mmol/L   Chloride 107 96 - 112 mmol/L  CO2 24 19 - 32 mmol/L   Glucose, Bld 74 70 - 99 mg/dL   BUN 8 6 - 23 mg/dL   Creatinine, Ser 0.32 (L) 0.50 - 1.10 mg/dL   Calcium 9.1 8.4 - 10.5 mg/dL   Total Protein 8.1 6.0 - 8.3 g/dL   Albumin 3.5 3.5 - 5.2 g/dL   AST 94 (H) 0 - 37 U/L   ALT 36 (H) 0 - 35 U/L   Alkaline Phosphatase 141 (H) 39 - 117 U/L   Total Bilirubin 9.3 (H) 0.3 - 1.2 mg/dL   GFR calc non Af Amer >90 >90 mL/min   GFR calc Af Amer >90 >90 mL/min   Anion gap 8 5 - 15  Reticulocytes  Result Value Ref Range   Retic Ct Pct >23.0 (H) 0.4 - 3.1 %   RBC. 2.20 (L) 3.87 - 5.11 MIL/uL   Retic Count, Manual NOT CALCULATED 19.0 - 186.0 K/uL  Pregnancy, urine  Result Value Ref Range   Preg Test, Ur NEGATIVE NEGATIVE  Urinalysis, Routine w reflex microscopic  Result Value Ref Range   Color, Urine YELLOW YELLOW   APPearance CLEAR CLEAR   Specific Gravity, Urine 1.020 1.005 - 1.030   pH 5.5 5.0 - 8.0   Glucose, UA NEGATIVE NEGATIVE mg/dL   Hgb urine dipstick MODERATE (A) NEGATIVE   Bilirubin Urine SMALL (A) NEGATIVE   Ketones, ur NEGATIVE NEGATIVE mg/dL   Protein, ur 100 (A) NEGATIVE mg/dL   Urobilinogen, UA 0.2 0.0 - 1.0 mg/dL   Nitrite NEGATIVE NEGATIVE   Leukocytes, UA NEGATIVE NEGATIVE  Urine microscopic-add on  Result Value Ref Range   Squamous Epithelial / LPF FEW (A) RARE   WBC, UA 0-2 <3 WBC/hpf   RBC / HPF 0-2 <3 RBC/hpf    Bacteria, UA FEW (A) RARE      2045:  Labs near baseline. VS remain stable. Pt states she feels better and wants to go home now. Dx and testing d/w pt.  Questions answered.  Verb understanding, agreeable to d/c home with outpt f/u.    Francine Graven, DO 11/26/14 2156

## 2014-11-24 NOTE — Discharge Instructions (Signed)
°Emergency Department Resource Guide °1) Find a Doctor and Pay Out of Pocket °Although you won't have to find out who is covered by your insurance plan, it is a good idea to ask around and get recommendations. You will then need to call the office and see if the doctor you have chosen will accept you as a new patient and what types of options they offer for patients who are self-pay. Some doctors offer discounts or will set up payment plans for their patients who do not have insurance, but you will need to ask so you aren't surprised when you get to your appointment. ° °2) Contact Your Local Health Department °Not all health departments have doctors that can see patients for sick visits, but many do, so it is worth a call to see if yours does. If you don't know where your local health department is, you can check in your phone book. The CDC also has a tool to help you locate your state's health department, and many state websites also have listings of all of their local health departments. ° °3) Find a Walk-in Clinic °If your illness is not likely to be very severe or complicated, you may want to try a walk in clinic. These are popping up all over the country in pharmacies, drugstores, and shopping centers. They're usually staffed by nurse practitioners or physician assistants that have been trained to treat common illnesses and complaints. They're usually fairly quick and inexpensive. However, if you have serious medical issues or chronic medical problems, these are probably not your best option. ° °No Primary Care Doctor: °- Call [facility] at  832-8000 - they can help you locate a primary care doctor that  accepts your insurance, provides certain services, etc. °- Physician Referral Service- 1-800-533-3463 ° °Chronic Pain Problems: °Organization         Address  Phone   Notes  °[facility]  (336) 297-2271 Patients need to be referred by their primary care doctor.  ° °Medication  Assistance: °Organization         Address  Phone   Notes  °[place] [facility] [address] °[place], [place] [address] (336) 641-8030 --Must be a resident of [place] °-- Must have NO insurance coverage whatsoever (no Medicaid/ Medicare, etc.) °-- The pt. MUST have a primary care doctor that directs their care regularly and follows them in the community °  °[facility]  (866) 331-1348   °[employer]  (888) 892-1162   ° °Agencies that provide inexpensive medical care: °Organization         Address  Phone   Notes  °[facility]  (336) 832-8035   °[facility]    (336) 832-7272   °[facility] [address] °[place], [place] [address] (336) 832-4777   °[facility] [address], °[place] (336) 271-4999   °[facility]    (336) 373-0678   °[facility]    (336) 272-1050   °[facility] ° [address], [place] Phone:  (336) 832-4444, Fax:  (336) 832-4440 Hours of Operation:  9 am - 6 pm, M-F.  Also accepts Medicaid/Medicare and self-pay.  °[facility] ° [address], [place] Phone: (336) 832-3150, Fax: (336) 832-3151. Hours of Operation:  8:30 am - 5:30 pm, M-F.  Also accepts Medicaid and self-pay.  °[facility] [place] [address], [place] Phone: (336) 878-6027   °[facility] [address], [place], [place] (336)723-1848, Ext. 123 Mondays & Thursdays: 7-9 AM.  First 15 patients are seen on a first come, first serve basis. °  ° °Medicaid-accepting [place] Providers: ° °Organization         Address  Phone   Notes  °[facility] [address], [place] (336) 641-2100 Also accepts self-pay patients.  °[facility] [address], [place] ° (336) 856-9996   °[facility] [address], [place]  (336) 288-8857   °[facility] [address] [place] [address], [place] (336) 299-7000   °Veita Bland [address], [place]  ° (336) 373-1557 Only accepts [place] Access Medicaid patients after they have their name applied to their card.  ° °Self-Pay (no insurance) in [place]: ° °Organization         Address  Phone   Notes  °Sickle Cell Patients, [facility] [address], [place] (336) 832-1970   °[facility] [address], [place] (336) 832-4400   °[facility] [place] ° [address], [place] (336) 992-4800   °[facility]/Dr. Osei-Bonsu ° [address] [place] [address], [place] or [address], [place] (336) 841-8500 Phone number for both [place] and [place] locations is the same.  °[facility] [address], [place] (336) 299-0000   °[facility] [address] [place] [address], [place] or [address] (336) 852-7530 °(336) 878-2260   °[facility] [address], [place] (336) 350-1642, phone; (336) 294-5005, fax Sees patients 1st and 3rd Saturday of every month.  Must not qualify for public or private insurance (i.e. Medicaid, Medicare, [place] Health Choice, Veterans' Benefits) • Household income should be no more than 200% of the poverty level •The clinic cannot treat you if you are pregnant or think you are pregnant • Sexually transmitted diseases are not treated at the clinic.  ° ° °Dental Care: °Organization         Address  Phone  Notes  °[place] [facility] [address], [place] (336) 641-6152 Accepts children up to age 21 who are enrolled in Medicaid or [place] Health Choice; pregnant women with a Medicaid card; and children who have applied for Medicaid or [place] Health Choice, but were declined, whose parents can pay a reduced fee at time of service.  °[place]  [facility] [place]  [address], [place] (336) 641-7733 Accepts children up to age 21 who are enrolled in Medicaid or [place] Health Choice; pregnant women with a Medicaid card; and children who have applied for Medicaid or [place] Health Choice, but were declined, whose parents can pay a reduced fee at time of service.  °[facility] ° [address],  (336) 641-4533 Patients are seen by appointment only. Walk-ins are not accepted. [facility] will see patients 18 years of age and older. °Monday - Tuesday (8am-5pm) °Most Wednesdays (8:30-5pm) °$30 per visit, cash only  °[facility] ° [address], [place] (336) 641-4533 Patients are seen by appointment only. Walk-ins are not accepted. [facility] will see patients 18 years of age and older. °One   Wednesday Evening (Monthly: Volunteer Based).  $30 per visit, cash only  °[facility]  (919) 537-3737 for adults; Children under age 4, call Graduate Pediatric Dentistry at (919) [phone]. Children aged 4-14, please call (919) [phone] to request a pediatric application. ° Dental services are provided in all areas of dental care including fillings, crowns and bridges, complete and partial dentures, implants, gum treatment, root canals, and extractions. Preventive care is also provided. Treatment is provided to both adults and children. °Patients are selected via a lottery and there is often a waiting list. °  °[facility] [address], °[place] ° (336) 763-8833 [email or web address] °  °[facility] [address], [place], [place] (336)723-1848, Ext. 123 Second and Fourth Thursday of each month, opens at 6:30 AM; Clinic ends at 9 AM.  Patients are seen on a first-come first-served basis, and a limited number are seen during each clinic.  ° °[facility] ° [address], [place], [place] (336) 723-7904    Eligibility Requirements °You must have lived in [place], [place], or [place] counties for at least the last three months. °  You cannot be eligible for state or federal sponsored healthcare insurance, including Veterans Administration, Medicaid, or Medicare. °  You generally cannot be eligible for healthcare insurance through your employer.  °  How to apply: °Eligibility screenings are held every Tuesday and Wednesday afternoon from 1:00 pm until 4:00 pm. You do not need an appointment for the interview!  °[facility] [address], [place], [place] 336-631-2330   °[facility]  336-342-8273   °[facility]  336-703-3100   °[facility]  336-570-6415   ° °Behavioral Health Resources in the Community: °Intensive Outpatient Programs °Organization         Address  Phone  Notes  °[facility] [address], [place], [place] 336-878-6098   °[facility] [address], [place], [place] 336-832-9800   °[facility] [address], [place], [place] ° 336-882-2125   °[facility] [address],  °[place], [place] 1-800-853-5163 or 336-641-4981   °Substance Abuse Resources °Organization         Address  Phone  Notes  °[facility]  336-882-2125   °[facility]  336-784-9470   °[facility]  336-285-9073   °[facility]  336-845-3988   °Residential & Outpatient Substance Abuse Program  1-800-659-3381   °Psychological Services °Organization         Address  Phone  Notes  °[facility]  336- 832-9600   °[facility]  336- 378-7881   °[facility] [address], [place] 1-800-853-5163 or 336-641-4981   ° °Mobile Crisis Teams °Organization         Address  Phone  Notes  °[facility], Mobile Crisis Care Unit  1-877-626-1772   °Assertive °[facility] ° [address]  [place], [place] 336-834-9664   °Sharon DeEsch [address] °[place] 336-554-5454   ° °Self-Help/Support Groups °Organization         Address  Phone             Notes  °[facility]  - variety of support groups  336- 373-1402 Call for more information  °Narcotics Anonymous (NA), [facility] [address], °[place]  2 meetings at this location  ° °  Residential Treatment Programs °Organization         Address  Phone  Notes  °[facility] [address],    °[place]  1-866-801-8205   °[facility] ° [name], [place], [place] 704-293-8524   °[facility] [address], [place] 336-845-3988 Admissions: 8am-3pm M-F  °[facility] [address]    °[place], [place] 336-841-1104   °[facility] [address], [place], [place] 336-379-7146   °[facility] [address]  °[place], [place] 336-285-9073   °[facility] - Intensive Outpatient [address], , [name] 336-852-3033   °[facility] [address]  °[place], [place] 1-877-615-2722 or 336-784-9470   °[facility] [address] [place], [place] 336-227-7417 Accepts Medicaid  °[facility] [address]  ° [place] 1-800-659-3381 Substance Abuse/Addiction Treatment  ° °[place] Behavioral Health Resources °Organization         Address  Phone  Notes  °[facility]  (888) 581-9988   °Julie Brannon, PhD [address] [place], [place]   (336) 349-5553 or (336) 951-0000   °[facility]   [address] °[place], [place] (336) 349-4454   °[facility] [address], [place], [place] (336) 342-8316 Insurance/Medicaid/sponsorship through [facility]  °[facility] [address]                                    [place], [place] (336) 342-8316 Therapy/tele-psych/case    °[facility] [address]  ° [place], [place] (336) 349-2233    °Dr. Arfeen  (336) 349-4544   °[facility] [place]  [employer] [place] [facility] 1) [address],  °2) [address], [place] °3)  [address], [place] (336) 349-3220 °(336) 342-7768 ° °(336) 342-8140   °[place] [facility] (336) 342-1394 or (336) 342-3537 (After Hours)    ° ° ° °Take your usual prescriptions as previously directed.  Call your regular medical doctor tomorrow morning to schedule a follow up appointment within the next 2 days.  Return to the Emergency Department immediately sooner if worsening.  ° °

## 2014-12-13 ENCOUNTER — Other Ambulatory Visit: Payer: Self-pay

## 2014-12-13 ENCOUNTER — Other Ambulatory Visit: Payer: Self-pay | Admitting: Family Medicine

## 2014-12-13 ENCOUNTER — Telehealth (HOSPITAL_COMMUNITY): Payer: Self-pay | Admitting: Hematology

## 2014-12-13 ENCOUNTER — Encounter (HOSPITAL_COMMUNITY): Payer: Self-pay

## 2014-12-13 ENCOUNTER — Non-Acute Institutional Stay (HOSPITAL_COMMUNITY)
Admission: AD | Admit: 2014-12-13 | Discharge: 2014-12-13 | Disposition: A | Discharge status: Home / Self Care | Payer: BLUE CROSS/BLUE SHIELD | Attending: Internal Medicine | Admitting: Internal Medicine

## 2014-12-13 DIAGNOSIS — D571 Sickle-cell disease without crisis: Secondary | ICD-10-CM

## 2014-12-13 DIAGNOSIS — Z79891 Long term (current) use of opiate analgesic: Secondary | ICD-10-CM | POA: Diagnosis not present

## 2014-12-13 DIAGNOSIS — D5700 Hb-SS disease with crisis, unspecified: Secondary | ICD-10-CM

## 2014-12-13 DIAGNOSIS — Z79899 Other long term (current) drug therapy: Secondary | ICD-10-CM | POA: Insufficient documentation

## 2014-12-13 DIAGNOSIS — R0902 Hypoxemia: Secondary | ICD-10-CM

## 2014-12-13 DIAGNOSIS — E860 Dehydration: Secondary | ICD-10-CM | POA: Insufficient documentation

## 2014-12-13 LAB — RETICULOCYTES
RBC.: 2 MIL/uL — AB (ref 3.87–5.11)
Retic Ct Pct: >23 % — ABNORMAL HIGH (ref 0.4–3.1)

## 2014-12-13 LAB — CBC WITH DIFFERENTIAL/PLATELET
BASOS ABS: 0 10*3/uL (ref 0.0–0.1)
Basophils Relative: 0 % (ref 0–1)
EOS ABS: 0.1 10*3/uL (ref 0.0–0.7)
Eosinophils Relative: 1 % (ref 0–5)
HEMATOCRIT: 20.9 % — AB (ref 36.0–46.0)
HEMOGLOBIN: 7.7 g/dL — AB (ref 12.0–15.0)
Lymphocytes Relative: 30 % (ref 12–46)
Lymphs Abs: 2.5 10*3/uL (ref 0.7–4.0)
MCH: 38.5 pg — ABNORMAL HIGH (ref 26.0–34.0)
MCHC: 36.8 g/dL — AB (ref 30.0–36.0)
MCV: 104.5 fL — AB (ref 78.0–100.0)
MONO ABS: 1.2 10*3/uL — AB (ref 0.1–1.0)
MONOS PCT: 14 % — AB (ref 3–12)
Neutro Abs: 4.6 10*3/uL (ref 1.7–7.7)
Neutrophils Relative %: 55 % (ref 43–77)
Platelets: 191 10*3/uL (ref 150–400)
RBC: 2 MIL/uL — ABNORMAL LOW (ref 3.87–5.11)
RDW: 19.8 % — AB (ref 11.5–15.5)
WBC: 8.4 10*3/uL (ref 4.0–10.5)
nRBC: 5 /100 WBC — ABNORMAL HIGH

## 2014-12-13 LAB — COMPREHENSIVE METABOLIC PANEL
ALK PHOS: 135 U/L — AB (ref 39–117)
ALT: 31 U/L (ref 0–35)
AST: 73 U/L — ABNORMAL HIGH (ref 0–37)
Albumin: 3.3 g/dL — ABNORMAL LOW (ref 3.5–5.2)
Anion gap: 7 (ref 5–15)
BUN: 7 mg/dL (ref 6–23)
CO2: 22 mmol/L (ref 19–32)
Calcium: 8.4 mg/dL (ref 8.4–10.5)
Chloride: 107 mmol/L (ref 96–112)
Creatinine, Ser: 0.44 mg/dL — ABNORMAL LOW (ref 0.50–1.10)
GFR calc non Af Amer: >90 mL/min (ref >90)
GLUCOSE: 93 mg/dL (ref 70–99)
POTASSIUM: 3.4 mmol/L — AB (ref 3.5–5.1)
SODIUM: 136 mmol/L (ref 135–145)
TOTAL PROTEIN: 7.5 g/dL (ref 6.0–8.3)
Total Bilirubin: 8.1 mg/dL — ABNORMAL HIGH (ref 0.3–1.2)

## 2014-12-13 LAB — LACTATE DEHYDROGENASE: LDH: 350 U/L — AB (ref 94–250)

## 2014-12-13 MED ORDER — SODIUM CHLORIDE 0.9 % IJ SOLN: 9.0000 mL | INTRAMUSCULAR | Status: DC | PRN

## 2014-12-13 MED ORDER — POLYETHYLENE GLYCOL 3350 17 G PO PACK: 17.0000 g | PACK | Freq: Every day | ORAL | Status: DC | PRN

## 2014-12-13 MED ORDER — OXYCODONE HCL 10 MG PO TABS: 10.0000 mg | ORAL_TABLET | ORAL | Status: DC | PRN

## 2014-12-13 MED ORDER — SODIUM CHLORIDE 0.9 % IV SOLN
12.5000 mg | Freq: Four times a day (QID) | INTRAVENOUS | Status: DC | PRN
  Filled 2014-12-13: qty 0.25

## 2014-12-13 MED ORDER — KETOROLAC TROMETHAMINE 30 MG/ML IJ SOLN
30.0000 mg | Freq: Four times a day (QID) | INTRAMUSCULAR | Status: DC
  Administered 2014-12-13: 30 mg via INTRAVENOUS
  Filled 2014-12-13: qty 1

## 2014-12-13 MED ORDER — FOLIC ACID 1 MG PO TABS: 1.0000 mg | ORAL_TABLET | Freq: Every day | ORAL | Status: DC

## 2014-12-13 MED ORDER — HYDROMORPHONE 2 MG/ML HIGH CONCENTRATION IV PCA SOLN
INTRAVENOUS | Status: DC
  Administered 2014-12-13: 3.5 mg via INTRAVENOUS
  Administered 2014-12-13: 11:00:00 via INTRAVENOUS
  Filled 2014-12-13 (×2): qty 25

## 2014-12-13 MED ORDER — NALOXONE HCL 0.4 MG/ML IJ SOLN: 0.4000 mg | INTRAMUSCULAR | Status: DC | PRN

## 2014-12-13 MED ORDER — PROMETHAZINE HCL 12.5 MG PO TABS: 12.5000 mg | ORAL_TABLET | Freq: Four times a day (QID) | ORAL | Status: DC | PRN

## 2014-12-13 MED ORDER — ONDANSETRON HCL 4 MG/2ML IJ SOLN: 4.0000 mg | Freq: Four times a day (QID) | INTRAMUSCULAR | Status: DC | PRN

## 2014-12-13 MED ORDER — ONDANSETRON HCL 4 MG PO TABS
4.0000 mg | ORAL_TABLET | Freq: Once | ORAL | Status: AC
  Administered 2014-12-13: 4 mg via ORAL
  Filled 2014-12-13: qty 1

## 2014-12-13 MED ORDER — SENNOSIDES-DOCUSATE SODIUM 8.6-50 MG PO TABS: 1.0000 | ORAL_TABLET | Freq: Two times a day (BID) | ORAL | Status: DC

## 2014-12-13 MED ORDER — DEXTROSE-NACL 5-0.45 % IV SOLN
INTRAVENOUS | Status: DC
  Administered 2014-12-13: 11:00:00 via INTRAVENOUS

## 2014-12-13 MED ORDER — DIPHENHYDRAMINE HCL 12.5 MG/5ML PO ELIX: 12.5000 mg | ORAL_SOLUTION | Freq: Four times a day (QID) | ORAL | Status: DC | PRN

## 2014-12-13 NOTE — Discharge Summary (Signed)
Physician Discharge Summary  Patient ID: Dawn [name] MRN: [id number] DOB/AGE: 1981/05/04 34 y.o.  Admit date: 12/13/2014 Discharge date: 12/13/2014  Admission Diagnoses:  Discharge Diagnoses:  Active Problems:   Sickle cell anemia with crisis   Discharged Condition: good  Hospital Course: Patient was admitted with sickle cell painful crisis. She was treated with IV Dilaudid PCA, Toradol and IV fluids. Patient responded to treatment very well. At the time of discharge her pain is down to 4 from 10 out of 10. She is feeling much better and feels like she will be more functional at home. She's been asked to follow-up with primary care physician on continue with her home medications.  Consults: None  Significant Diagnostic Studies: labs: CBC, CMP, reticulocyte count where checked. All within her normal range  Treatments: IV hydration and analgesia: Dilaudid  Discharge Exam: Blood pressure 114/51, pulse 86, temperature 98.8 F (37.1 C), temperature source Oral, resp. rate 15, height 5\' 3"  (1.6 m), weight 69.854 kg (154 lb), SpO2 92 %. General appearance: alert, cooperative, appears stated age and no distress Eyes: conjunctivae/corneas clear. PERRL, EOM's intact. Fundi benign. Neck: no adenopathy, no carotid bruit, no JVD, supple, symmetrical, trachea midline and thyroid not enlarged, symmetric, no tenderness/mass/nodules Back: symmetric, no curvature. ROM normal. No CVA tenderness. Resp: clear to auscultation bilaterally Chest wall: no tenderness Cardio: regular rate and rhythm, S1, S2 normal, no murmur, click, rub or gallop GI: soft, non-tender; bowel sounds normal; no masses,  no organomegaly Extremities: extremities normal, atraumatic, no cyanosis or edema Pulses: 2+ and symmetric Skin: Skin color, texture, turgor normal. No rashes or lesions Neurologic: Grossly normal  Disposition: 01-Home or Self Care     Medication List    TAKE these medications        cetirizine 10 MG  tablet  Commonly known as:  ZYRTEC  Take 10 mg by mouth daily.     folic acid 1 MG tablet  Commonly known as:  FOLVITE  Take 1 tablet (1 mg total) by mouth daily.     hydroxyurea 500 MG capsule  Commonly known as:  HYDREA  TAKE 1 CAPSULE ON ODD DAYS AND 2 CAPSULES ON EVEN DAYS     Vitamin D (Ergocalciferol) 50000 UNITS Caps capsule  Commonly known as:  DRISDOL  Take 1 capsule (50,000 Units total) by mouth every 7 (seven) days. Takes on Sundays.      ASK your doctor about these medications        Oxycodone HCl 10 MG Tabs  Take 1 tablet (10 mg total) by mouth every 4 (four) hours as needed (moderate pain).  Ask about: Which instructions should I use?     promethazine 12.5 MG tablet  Commonly known as:  PHENERGAN  Take 1 tablet (12.5 mg total) by mouth every 6 (six) hours as needed for nausea.  Ask about: Which instructions should I use?         SignedBarbette Merino 12/13/2014, 7:22 PM

## 2014-12-13 NOTE — Telephone Encounter (Signed)
Patient C/O pain to arms/legs/back that is 7/10 on pain scale.  Patient denies N/V/D, or chest pain.  Patient is requesting to come to sickle cell medical center.  I explained I would notify the physician and give her a call back.  Patient verbalizes understanding.

## 2014-12-13 NOTE — H&P (Signed)
Dawn [name] is an 34 y.o. female.   Chief Complaint: Pain in chest him back for 3 days HPI: Patient is a 34 year old female was known history of sickle cell disease who is here with sickle cell painful crisis. She's been having pain for 3 days now. Pain is 9 out of 10 involving her rib cage lower back and legs. She has tried her medications at home but has not getting much relief. Pain is made worse by any activities and not relieved by anything. Denied any fever or chills, no shortness of breath, no nausea vomiting or diarrhea.  Past Medical History  Diagnosis Date  . Sickle cell disease   . Sickle cell disease, type S   . Blood transfusion   . 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 HCAP (healthcare-associated pneumonia) 05/19/2013  . Sickle cell anemia     Past Surgical History  Procedure Laterality Date  . Cholecystectomy    .  left knee acl reconstruction    . Cesarean section      x 2  . Portacath placement    . Portacath placement Left 02/23/2013    Procedure: INSERTION PORT-A-CATH;  Surgeon: Donato Heinz, MD;  Location: [facility];  Service: General;  Laterality: Left;  . Port-a-cath removal Left 07/29/2013    Procedure: REMOVAL PORT-A-CATH;  Surgeon: Scherry Ran, MD;  Location: [facility];  Service: General;  Laterality: Left;  . Tee without cardioversion N/A 08/07/2013    Procedure: TRANSESOPHAGEAL ECHOCARDIOGRAM (TEE);  Surgeon: Arnoldo Lenis, MD;  Location: [facility];  Service: Cardiology;  Laterality: N/A;    Family History  Problem Relation Age of Onset  . Sickle cell trait Mother   . Sickle cell trait Father   . Hypertension Father   . Diabetes Father   . Sickle cell trait Sister   . Cancer Paternal Aunt     Breast   Social History:  reports that she has never smoked. She has quit using smokeless tobacco. She reports that she does not drink alcohol or use illicit drugs.  Allergies:  Allergies  Allergen Reactions  . Desferal [Deferoxamine] Hives    Local reaction on arm only during infusion. Can take with benadryl   . Latex Other (See Comments)    REACTION: Pt experiences a burning sensation on contacted skin areas  . Lisinopril Other (See Comments) and Cough    REACTION: Sore/scratchy throat  . Tape Other (See Comments)    REACTION: Pt. Experiences a burning sensation on contacted skin areas    Medications Prior to Admission  Medication Sig Dispense Refill  . cetirizine (ZYRTEC) 10 MG tablet Take 10 mg by mouth daily.    . folic acid (FOLVITE) 1 MG tablet Take 1 tablet (1 mg total) by mouth daily. 30 tablet 11  . hydroxyurea (HYDREA) 500 MG capsule TAKE 1 CAPSULE ON ODD DAYS AND 2 CAPSULES ON EVEN DAYS 45 capsule 2  . Oxycodone HCl 10 MG TABS Take 1 tablet (10 mg total) by mouth every 4 (four) hours as needed (moderate pain). 60 tablet 0  . promethazine (PHENERGAN) 12.5 MG tablet Take 1 tablet (12.5 mg total) by mouth every 6 (six) hours as needed for nausea. 30 tablet 0  . Vitamin D, Ergocalciferol, (DRISDOL) 50000 UNITS CAPS capsule Take 1 capsule (50,000 Units total) by mouth every 7 (seven) days. Takes on Sundays. 30 capsule 11    No results found for this or any previous visit (from the past 48 hour(s)).  No results found.  Review of Systems  Constitutional: Negative.   HENT: Negative.   Eyes: Negative.   Respiratory: Negative.   Cardiovascular: Negative.   Gastrointestinal: Negative.   Genitourinary: Negative.   Musculoskeletal: Positive for myalgias, back pain and joint pain. Negative for falls.  Skin: Negative.   Neurological: Negative.   Endo/Heme/Allergies: Negative.   Psychiatric/Behavioral: Negative.     Blood pressure 132/72, pulse 91, temperature 98.8 F (37.1 C), temperature source Oral, resp. rate 18, height 5\' 3"  (1.6 m), weight 69.854 kg (154 lb), SpO2 97 %. Physical Exam  Constitutional: She is oriented to person, place, and time. She appears well-developed and well-nourished.  HENT:  Head:  Normocephalic and atraumatic.  Right Ear: External ear normal.  Left Ear: External ear normal.  Eyes: EOM are normal. Pupils are equal, round, and reactive to light. Scleral icterus is present.  Neck: Normal range of motion. Neck supple.  Cardiovascular: Normal rate, regular rhythm, normal heart sounds and intact distal pulses.   Respiratory: Effort normal and breath sounds normal.  GI: Soft. Bowel sounds are normal.  Musculoskeletal: Normal range of motion. She exhibits tenderness. She exhibits no edema.  Neurological: She is alert and oriented to person, place, and time. She has normal reflexes.  Skin: Skin is warm and dry.  Psychiatric: She has a normal mood and affect. Her behavior is normal.     Assessment/Plan A 34 year old female here with sickle cell painful crisis.  #1 sickle cell painful crisis: Patient will be admitted and be treated with IV Dilaudid PCA, Toradol and IV fluids. We will also check her blood for possible hemolytic anemia. I'll goal is to get her pain down to a 5 or 4 so she can be functional at home.  #2 Sickle cell anemia: Patient will have her hemoglobin checked as well as reticulocyte count. If there is any evidence of hemolytic anemia will treat it as such.  #3 dehydration: Patient will be aggressively hydrated.  #4 chronic pain: Patient will resume her home medications at the time of discharge.  GARBA,LAWAL 12/13/2014, 10:14 AM

## 2014-12-13 NOTE — Progress Notes (Addendum)
Pt discharged to home; discharge instructions explained, given, and signed; pt alert, oriented, and ambulatory; no complications noted

## 2014-12-13 NOTE — Telephone Encounter (Signed)
Spoke with Dr. Jonelle Sidle, advised patient that it is ok for her to come to [facility].  Patient verbalizes understanding.

## 2014-12-13 NOTE — Telephone Encounter (Signed)
Pt requests refill of oxycodone 10mg  tablets PO and phenergan 12.5 mg tablet PO

## 2014-12-13 NOTE — Progress Notes (Signed)
Meds ordered this encounter  Medications  . Oxycodone HCl 10 MG TABS    Sig: Take 1 tablet (10 mg total) by mouth every 4 (four) hours as needed (moderate pain).    Dispense:  60 tablet    Refill:  0    Order Specific Question:  Supervising Provider    Answer:  Liston Alba A [[id number]]  Reviewed [place] Substance Reporting system prior to reorder [name], FNP

## 2014-12-15 ENCOUNTER — Ambulatory Visit: Payer: Self-pay | Admitting: Family Medicine

## 2014-12-16 ENCOUNTER — Emergency Department (HOSPITAL_COMMUNITY)
Admission: EM | Admit: 2014-12-16 | Discharge: 2014-12-17 | Disposition: A | Discharge status: Home / Self Care | Payer: BLUE CROSS/BLUE SHIELD | Attending: Emergency Medicine | Admitting: Emergency Medicine

## 2014-12-16 ENCOUNTER — Encounter (HOSPITAL_COMMUNITY): Payer: Self-pay | Admitting: Emergency Medicine

## 2014-12-16 ENCOUNTER — Ambulatory Visit (INDEPENDENT_AMBULATORY_CARE_PROVIDER_SITE_OTHER): Payer: BLUE CROSS/BLUE SHIELD | Admitting: Family Medicine

## 2014-12-16 VITALS — BP 136/63 | HR 92 | Temp 98.5°F | Resp 16 | Ht 63.0 in | Wt 158.0 lb

## 2014-12-16 DIAGNOSIS — Z79899 Other long term (current) drug therapy: Secondary | ICD-10-CM | POA: Insufficient documentation

## 2014-12-16 DIAGNOSIS — Z23 Encounter for immunization: Secondary | ICD-10-CM | POA: Diagnosis not present

## 2014-12-16 DIAGNOSIS — Z91040 Latex allergy status: Secondary | ICD-10-CM | POA: Diagnosis not present

## 2014-12-16 DIAGNOSIS — E559 Vitamin D deficiency, unspecified: Secondary | ICD-10-CM

## 2014-12-16 DIAGNOSIS — N390 Urinary tract infection, site not specified: Secondary | ICD-10-CM | POA: Insufficient documentation

## 2014-12-16 DIAGNOSIS — D571 Sickle-cell disease without crisis: Secondary | ICD-10-CM | POA: Diagnosis not present

## 2014-12-16 DIAGNOSIS — R3915 Urgency of urination: Secondary | ICD-10-CM | POA: Diagnosis not present

## 2014-12-16 DIAGNOSIS — M549 Dorsalgia, unspecified: Secondary | ICD-10-CM | POA: Diagnosis not present

## 2014-12-16 DIAGNOSIS — D57219 Sickle-cell/Hb-C disease with crisis, unspecified: Secondary | ICD-10-CM | POA: Diagnosis not present

## 2014-12-16 DIAGNOSIS — R079 Chest pain, unspecified: Secondary | ICD-10-CM | POA: Diagnosis not present

## 2014-12-16 DIAGNOSIS — Z8701 Personal history of pneumonia (recurrent): Secondary | ICD-10-CM | POA: Diagnosis not present

## 2014-12-16 DIAGNOSIS — A499 Bacterial infection, unspecified: Secondary | ICD-10-CM

## 2014-12-16 DIAGNOSIS — R300 Dysuria: Secondary | ICD-10-CM

## 2014-12-16 DIAGNOSIS — D5700 Hb-SS disease with crisis, unspecified: Secondary | ICD-10-CM | POA: Diagnosis not present

## 2014-12-16 DIAGNOSIS — J302 Other seasonal allergic rhinitis: Secondary | ICD-10-CM

## 2014-12-16 LAB — COMPLETE METABOLIC PANEL WITH GFR
ALK PHOS: 130 U/L — AB (ref 39–117)
ALT: 35 U/L (ref 0–35)
AST: 77 U/L — ABNORMAL HIGH (ref 0–37)
Albumin: 3.4 g/dL — ABNORMAL LOW (ref 3.5–5.2)
BUN: 5 mg/dL — AB (ref 6–23)
CO2: 22 mEq/L (ref 19–32)
CREATININE: 0.52 mg/dL (ref 0.50–1.10)
Calcium: 8.6 mg/dL (ref 8.4–10.5)
Chloride: 105 mEq/L (ref 96–112)
GFR, Est African American: >89 mL/min
GFR, Est Non African American: >89 mL/min
Glucose, Bld: 73 mg/dL (ref 70–99)
Potassium: 4.2 mEq/L (ref 3.5–5.3)
SODIUM: 137 meq/L (ref 135–145)
Total Bilirubin: 10.7 mg/dL — ABNORMAL HIGH (ref 0.2–1.2)
Total Protein: 7 g/dL (ref 6.0–8.3)

## 2014-12-16 MED ORDER — CETIRIZINE HCL 10 MG PO TABS: 10.0000 mg | ORAL_TABLET | Freq: Every day | ORAL | Status: DC

## 2014-12-16 MED ORDER — CIPROFLOXACIN HCL 250 MG PO TABS: 250.0000 mg | ORAL_TABLET | Freq: Two times a day (BID) | ORAL | Status: AC

## 2014-12-16 NOTE — Progress Notes (Signed)
Subjective:    Patient ID: Dawn [name], female    DOB: 05-30-1981, 34 y.o.   MRN: [id number]  HPI  [name] is a 34 year old female with a history of sickle cell anemia, HbSS that presents for 6 month follow up of sickle cell anemia. [name] states that she is taking Hydrea and folic acid therapy consistently. She reports that she recently experienced a sickle cell crisis and attributes crisis to weather changes. She states that she is having mild lower extremity pain today, but has not taken pain medications due to the fact that she is driving. She maintains that pain intensity is 4/10 described as aching and intermittent.   Patient is complaining of urinary tract infection symptoms. She reports urinary frequency, urgency and dysuria starting 3 days ago. She also reports that urine is malodorous and dark brown. [name] states that she is not currently sexually active. She has not attempted any OTC interventions to alleviate symptoms.   [name] denies shortness of breath, fatigue, chest pain, nausea, vomiting, or diarrhea.   Past Medical History  Diagnosis Date  . Sickle cell disease   . Sickle cell disease, type S   . Blood transfusion   . 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 HCAP (healthcare-associated pneumonia) 05/19/2013  . Sickle cell anemia    History   Social History  . Marital Status: Single    Spouse Name: N/A  . Number of Children: N/A  . Years of Education: N/A   Occupational History  . Not on file.   Social History Main Topics  . Smoking status: Never Smoker   . Smokeless tobacco: Former Systems developer  . Alcohol Use: No  . Drug Use: No  . Sexual Activity: Yes    Birth Control/ Protection: IUD   Other Topics Concern  . Not on file   Social History Narrative    Review of Systems  Constitutional: Positive for fever. Negative for fatigue.  HENT: Negative.   Eyes: Negative.  Negative for photophobia and visual disturbance.  Respiratory: Negative.    Cardiovascular: Negative.   Gastrointestinal: Negative.   Endocrine: Negative.   Genitourinary: Negative.   Musculoskeletal: Negative.   Skin: Negative.   Allergic/Immunologic: Positive for environmental allergies. Negative for food allergies and immunocompromised state.  Neurological: Negative.   Hematological: Negative.   Psychiatric/Behavioral: Negative.        Objective:   Physical Exam  Constitutional: She is oriented to person, place, and time. She appears well-developed and well-nourished.  HENT:  Head: Normocephalic and atraumatic.  Right Ear: External ear normal.  Left Ear: External ear normal.  Mouth/Throat: Oropharynx is clear and moist.  Eyes: Conjunctivae and EOM are normal. Pupils are equal, round, and reactive to light.  Neck: Normal range of motion. Neck supple.  Cardiovascular: Normal rate, regular rhythm, normal heart sounds and intact distal pulses.   Pulmonary/Chest: Effort normal and breath sounds normal.  Abdominal: Soft. Bowel sounds are normal.  Musculoskeletal: Normal range of motion.  Neurological: She is alert and oriented to person, place, and time. She has normal reflexes.  Skin: Skin is warm and dry.  Psychiatric: She has a normal mood and affect. Her behavior is normal. Judgment and thought content normal.       BP 136/63 mmHg  Pulse 92  Temp(Src) 98.5 F (36.9 C) (Oral)  Resp 16  Ht 5\' 3"  (1.6 m)  Wt 158 lb (71.668 kg)  BMI 28.00 kg/m2 Assessment & Plan:  1. Hb-SS disease without crisis  Sickle cell disease - Continue Hydrea 500 mg twice daily. We discussed the need for good hydration, monitoring of hydration status, avoidance of heat, cold, stress, and infection triggers. We discussed the risks and benefits of Hydrea, including bone marrow suppression, the possibility of GI upset, skin ulcers, hair thinning, and teratogenicity. The patient was reminded of the need to seek medical attention of any symptoms of bleeding, anemia, or infection.  Continue folic acid 1 mg daily to prevent aplastic bone marrow crises.   Pulmonary evaluation - Patient denies severe recurrent wheezes, shortness of breath with exercise, or persistent cough. If these symptoms develop, pulmonary function tests with spirometry will be ordered, and if abnormal, plan on referral to Pulmonology for further evaluation.  Cardiac - Routine screening for pulmonary hypertension is not recommended.   Eye - High risk of proliferative retinopathy. Annual eye exam with retinal exam recommended to patient. She states that she was previously going to opthalmologist at [facility]. Will send referral for opthalmology   Immunization status - She will receive Prevnar 13 today  Acute and chronic painful episodes - [name] uses minimal opiate medication for pain management. We discussed that she is to receive her Schedule II prescriptions only from [place]. She is also aware that her prescription history is available to [place] online through the [place]. Controlled substance agreement signed previously.  We reviewed the terms of our pain agreement, including the need to keep medicines in a safe locked location away from children or pets, and the need to report excess sedation or constipation, measures to avoid constipation, and policies related to early refills and stolen prescriptions. According to the [place] [facility] program, we have reviewed details related to analgesia, adverse effects, aberrant behaviors.     Vitamin D deficiency - Will check vitamin D level   The above recommendations are taken from the NIH Evidence-Based Management of Sickle Cell Disease: Expert Panel Report, 20149.    - COMPLETE METABOLIC PANEL WITH GFR - CBC with Differential - Ambulatory referral to Ophthalmology  2. Urinary tract bacterial infections - ciprofloxacin (CIPRO) 250 MG tablet; Take 1 tablet (250 mg total) by mouth 2 (two) times daily.  Dispense: 10 tablet; Refill: 0  3. Dysuria -  Urinalysis - Urine culture  4. Urinary urgency - Urinalysis - Urine culture  5. Vitamin D deficiency - Vitamin D, 25-hydroxy  6. Seasonal allergies - cetirizine (ZYRTEC) 10 MG tablet; Take 1 tablet (10 mg total) by mouth daily.  Dispense: 30 tablet; Refill: 3  7. Immunization due - Pneumococcal conjugate vaccine 13-valent    RTC: 3 months for sickle cell anemia

## 2014-12-16 NOTE — ED Notes (Signed)
Patient states has been having sickle pain crisis for a few days.  States having back, chest and leg pain.  States saw her doctor today.

## 2014-12-16 NOTE — Patient Instructions (Addendum)
Urinary Tract Infection Urinary tract infections (UTIs) can develop anywhere along your urinary tract. Your urinary tract is your body's drainage system for removing wastes and extra water. Your urinary tract includes two kidneys, two ureters, a bladder, and a urethra. Your kidneys are a pair of bean-shaped organs. Each kidney is about the size of your fist. They are located below your ribs, one on each side of your spine. CAUSES Infections are caused by microbes, which are microscopic organisms, including fungi, viruses, and bacteria. These organisms are so small that they can only be seen through a microscope. Bacteria are the microbes that most commonly cause UTIs. SYMPTOMS  Symptoms of UTIs may vary by age and gender of the patient and by the location of the infection. Symptoms in young women typically include a frequent and intense urge to urinate and a painful, burning feeling in the bladder or urethra during urination. Older women and men are more likely to be tired, shaky, and weak and have muscle aches and abdominal pain. A fever may mean the infection is in your kidneys. Other symptoms of a kidney infection include pain in your back or sides below the ribs, nausea, and vomiting. DIAGNOSIS To diagnose a UTI, your caregiver will ask you about your symptoms. Your caregiver also will ask to provide a urine sample. The urine sample will be tested for bacteria and white blood cells. White blood cells are made by your body to help fight infection. TREATMENT  Typically, UTIs can be treated with medication. Because most UTIs are caused by a bacterial infection, they usually can be treated with the use of antibiotics. The choice of antibiotic and length of treatment depend on your symptoms and the type of bacteria causing your infection. HOME CARE INSTRUCTIONS  If you were prescribed antibiotics, take them exactly as your caregiver instructs you. Finish the medication even if you feel better after you  have only taken some of the medication.  Drink enough water and fluids to keep your urine clear or pale yellow.  Avoid caffeine, tea, and carbonated beverages. They tend to irritate your bladder.  Empty your bladder often. Avoid holding urine for long periods of time.  Empty your bladder before and after sexual intercourse.  After a bowel movement, women should cleanse from front to back. Use each tissue only once. SEEK MEDICAL CARE IF:   You have back pain.  You develop a fever.  Your symptoms do not begin to resolve within 3 days. SEEK IMMEDIATE MEDICAL CARE IF:   You have severe back pain or lower abdominal pain.  You develop chills.  You have nausea or vomiting.  You have continued burning or discomfort with urination. MAKE SURE YOU:   Understand these instructions.  Will watch your condition.  Will get help right away if you are not doing well or get worse. Document Released: 06/20/2005 Document Revised: 03/11/2012 Document Reviewed: 10/19/2011 [facility] Patient Information 2015 [place], [place]. This information is not intended to replace advice given to you by your health care provider. Make sure you discuss any questions you have with your health care provider. Sickle Cell Anemia, Adult Sickle cell anemia is a condition in which red blood cells have an abnormal "sickle" shape. This abnormal shape shortens the cells' life span, which results in a lower than normal concentration of red blood cells in the blood. The sickle shape also causes the cells to clump together and block free blood flow through the blood vessels. As a result, the tissues  and organs of the body do not receive enough oxygen. Sickle cell anemia causes organ damage and pain and increases the risk of infection. CAUSES  Sickle cell anemia is a genetic disorder. Those who receive two copies of the gene have the condition, and those who receive one copy have the trait. RISK FACTORS The sickle cell gene is  most common in people whose families originated in [place]. Other areas of the globe where sickle cell trait occurs include the [place], [place] and [place], and the [place].  SIGNS AND SYMPTOMS  Pain, especially in the extremities, back, chest, or abdomen (common). The pain may start suddenly or may develop following an illness, especially if there is dehydration. Pain can also occur due to overexertion or exposure to extreme temperature changes.  Frequent severe bacterial infections, especially certain types of pneumonia and meningitis.  Pain and swelling in the hands and feet.  Decreased activity.   Loss of appetite.   Change in behavior.  Headaches.  Seizures.  Shortness of breath or difficulty breathing.  Vision changes.  Skin ulcers. Those with the trait may not have symptoms or they may have mild symptoms.  DIAGNOSIS  Sickle cell anemia is diagnosed with blood tests that demonstrate the genetic trait. It is often diagnosed during the newborn period, due to mandatory testing nationwide. A variety of blood tests, X-rays, CT scans, MRI scans, ultrasounds, and lung function tests may also be done to monitor the condition. TREATMENT  Sickle cell anemia may be treated with:  Medicines. You may be given pain medicines, antibiotic medicines (to treat and prevent infections) or medicines to increase the production of certain types of hemoglobin.  Fluids.  Oxygen.  Blood transfusions. HOME CARE INSTRUCTIONS   Drink enough fluid to keep your urine clear or pale yellow. Increase your fluid intake in hot weather and during exercise.  Do not smoke. Smoking lowers oxygen levels in the blood.   Only take over-the-counter or prescription medicines for pain, fever, or discomfort as directed by your health care provider.  Take antibiotics as directed by your health care provider. Make sure you finish them it even if you start to feel better.   Take  supplements as directed by your health care provider.   Consider wearing a medical alert bracelet. This tells anyone caring for you in an emergency of your condition.   When traveling, keep your medical information, health care provider's names, and the medicines you take with you at all times.   If you develop a fever, do not take medicines to reduce the fever right away. This could cover up a problem that is developing. Notify your health care provider.  Keep all follow-up appointments with your health care provider. Sickle cell anemia requires regular medical care. SEEK MEDICAL CARE IF: You have a fever. SEEK IMMEDIATE MEDICAL CARE IF:   You feel dizzy or faint.   You have new abdominal pain, especially on the left side near the stomach area.   You develop a persistent, often uncomfortable and painful penile erection (priapism). If this is not treated immediately it will lead to impotence.   You have numbness your arms or legs or you have a hard time moving them.   You have a hard time with speech.   You have a fever or persistent symptoms for more than 2-3 days.   You have a fever and your symptoms suddenly get worse.   You have signs or symptoms of infection. These  include:   Chills.   Abnormal tiredness (lethargy).   Irritability.   Poor eating.   Vomiting.   You develop pain that is not helped with medicine.   You develop shortness of breath.  You have pain in your chest.   You are coughing up pus-like or bloody sputum.   You develop a stiff neck.  Your feet or hands swell or have pain.  Your abdomen appears bloated.  You develop joint pain. MAKE SURE YOU:  Understand these instructions. Document Released: 12/19/2005 Document Revised: 01/25/2014 Document Reviewed: 04/22/2013 [facility] Patient Information 2015 [place], [place]. This information is not intended to replace advice given to you by your health care provider. Make sure you  discuss any questions you have with your health care provider.

## 2014-12-17 ENCOUNTER — Emergency Department (HOSPITAL_COMMUNITY): Payer: BLUE CROSS/BLUE SHIELD

## 2014-12-17 DIAGNOSIS — D5700 Hb-SS disease with crisis, unspecified: Secondary | ICD-10-CM | POA: Diagnosis not present

## 2014-12-17 DIAGNOSIS — R079 Chest pain, unspecified: Secondary | ICD-10-CM | POA: Diagnosis not present

## 2014-12-17 DIAGNOSIS — M549 Dorsalgia, unspecified: Secondary | ICD-10-CM | POA: Diagnosis not present

## 2014-12-17 LAB — CBC WITH DIFFERENTIAL/PLATELET
BAND NEUTROPHILS: 0 % (ref 0–10)
BASOS ABS: 0 10*3/uL (ref 0.0–0.1)
BASOS PCT: 0 % (ref 0–1)
BLASTS: 0 %
Basophils Absolute: 0.2 10*3/uL — ABNORMAL HIGH (ref 0.0–0.1)
Basophils Relative: 2 % — ABNORMAL HIGH (ref 0–1)
EOS PCT: 1 % (ref 0–5)
Eosinophils Absolute: 0 10*3/uL (ref 0.0–0.7)
Eosinophils Absolute: 0.1 10*3/uL (ref 0.0–0.7)
Eosinophils Relative: 0 % (ref 0–5)
HCT: 22.6 % — ABNORMAL LOW (ref 36.0–46.0)
HEMATOCRIT: 20.4 % — AB (ref 36.0–46.0)
Hemoglobin: 7.4 g/dL — ABNORMAL LOW (ref 12.0–15.0)
Hemoglobin: 7.9 g/dL — ABNORMAL LOW (ref 12.0–15.0)
Lymphocytes Relative: 31 % (ref 12–46)
Lymphocytes Relative: 32 % (ref 12–46)
Lymphs Abs: 2.4 10*3/uL (ref 0.7–4.0)
Lymphs Abs: 2.9 10*3/uL (ref 0.7–4.0)
MCH: 36.9 pg — AB (ref 26.0–34.0)
MCH: 38.3 pg — AB (ref 26.0–34.0)
MCHC: 35 g/dL (ref 30.0–36.0)
MCHC: 36.3 g/dL — AB (ref 30.0–36.0)
MCV: 105.6 fL — ABNORMAL HIGH (ref 78.0–100.0)
MCV: 105.7 fL — ABNORMAL HIGH (ref 78.0–100.0)
METAMYELOCYTES PCT: 0 %
MONO ABS: 1 10*3/uL (ref 0.1–1.0)
MPV: 10.1 fL (ref 8.6–12.4)
MYELOCYTES: 0 %
Monocytes Absolute: 1 10*3/uL (ref 0.1–1.0)
Monocytes Relative: 11 % (ref 3–12)
Monocytes Relative: 13 % — ABNORMAL HIGH (ref 3–12)
NRBC: 0 /100{WBCs}
Neutro Abs: 4.3 10*3/uL (ref 1.7–7.7)
Neutro Abs: 5 10*3/uL (ref 1.7–7.7)
Neutrophils Relative %: 55 % (ref 43–77)
Neutrophils Relative %: 55 % (ref 43–77)
PROMYELOCYTES ABS: 0 %
Platelets: 217 10*3/uL (ref 150–400)
Platelets: 240 10*3/uL (ref 150–400)
RBC: 1.93 MIL/uL — ABNORMAL LOW (ref 3.87–5.11)
RBC: 2.14 MIL/uL — ABNORMAL LOW (ref 3.87–5.11)
RDW: 21.1 % — ABNORMAL HIGH (ref 11.5–15.5)
RDW: 21.3 % — ABNORMAL HIGH (ref 11.5–15.5)
WBC: 7.9 10*3/uL (ref 4.0–10.5)
WBC: 9.1 10*3/uL (ref 4.0–10.5)

## 2014-12-17 LAB — URINALYSIS, ROUTINE W REFLEX MICROSCOPIC
Glucose, UA: NEGATIVE mg/dL
Ketones, ur: NEGATIVE mg/dL
Nitrite: POSITIVE — AB
PH: 6 (ref 5.0–8.0)
PROTEIN: 100 mg/dL — AB
Specific Gravity, Urine: 1.02 (ref 1.005–1.030)
Urobilinogen, UA: >8 mg/dL — ABNORMAL HIGH (ref 0.0–1.0)

## 2014-12-17 LAB — POC URINE PREG, ED: PREG TEST UR: NEGATIVE

## 2014-12-17 LAB — URINALYSIS
Glucose, UA: NEGATIVE mg/dL
NITRITE: POSITIVE — AB
Protein, ur: 100 mg/dL — AB
Specific Gravity, Urine: 1.014 (ref 1.005–1.030)
UROBILINOGEN UA: 1 mg/dL (ref 0.0–1.0)
pH: 5.5 (ref 5.0–8.0)

## 2014-12-17 LAB — COMPREHENSIVE METABOLIC PANEL
ALK PHOS: 143 U/L — AB (ref 39–117)
ALT: 37 U/L — ABNORMAL HIGH (ref 0–35)
ANION GAP: 8 (ref 5–15)
AST: 86 U/L — ABNORMAL HIGH (ref 0–37)
Albumin: 3.1 g/dL — ABNORMAL LOW (ref 3.5–5.2)
BUN: 7 mg/dL (ref 6–23)
CALCIUM: 8.5 mg/dL (ref 8.4–10.5)
CO2: 23 mmol/L (ref 19–32)
CREATININE: 0.46 mg/dL — AB (ref 0.50–1.10)
Chloride: 105 mmol/L (ref 96–112)
Glucose, Bld: 100 mg/dL — ABNORMAL HIGH (ref 70–99)
Potassium: 3.4 mmol/L — ABNORMAL LOW (ref 3.5–5.1)
Sodium: 136 mmol/L (ref 135–145)
Total Bilirubin: 11.2 mg/dL — ABNORMAL HIGH (ref 0.3–1.2)
Total Protein: 7.4 g/dL (ref 6.0–8.3)

## 2014-12-17 LAB — URINE MICROSCOPIC-ADD ON

## 2014-12-17 LAB — RETICULOCYTES: RBC.: 1.93 MIL/uL — ABNORMAL LOW (ref 3.87–5.11)

## 2014-12-17 LAB — VITAMIN D 25 HYDROXY (VIT D DEFICIENCY, FRACTURES): Vit D, 25-Hydroxy: 23 ng/mL — ABNORMAL LOW (ref 30–100)

## 2014-12-17 MED ORDER — DEXTROSE 5 % IV SOLN
1.0000 g | Freq: Once | INTRAVENOUS | Status: AC
  Administered 2014-12-17: 1 g via INTRAVENOUS
  Filled 2014-12-17: qty 10

## 2014-12-17 MED ORDER — KETOROLAC TROMETHAMINE 30 MG/ML IJ SOLN
30.0000 mg | Freq: Once | INTRAMUSCULAR | Status: AC
  Administered 2014-12-17: 30 mg via INTRAVENOUS
  Filled 2014-12-17: qty 1

## 2014-12-17 MED ORDER — CEPHALEXIN 500 MG PO CAPS
500.0000 mg | ORAL_CAPSULE | Freq: Once | ORAL | Status: DC
  Filled 2014-12-17: qty 1

## 2014-12-17 MED ORDER — PROMETHAZINE HCL 25 MG/ML IJ SOLN
25.0000 mg | Freq: Once | INTRAMUSCULAR | Status: AC
  Administered 2014-12-17: 25 mg via INTRAVENOUS
  Filled 2014-12-17: qty 1

## 2014-12-17 MED ORDER — HYDROMORPHONE HCL 2 MG/ML IJ SOLN
2.0000 mg | INTRAMUSCULAR | Status: AC | PRN
  Administered 2014-12-17 (×3): 2 mg via INTRAVENOUS
  Filled 2014-12-17 (×3): qty 1

## 2014-12-17 MED ORDER — CEPHALEXIN 500 MG PO CAPS: 500.0000 mg | ORAL_CAPSULE | Freq: Two times a day (BID) | ORAL | Status: DC

## 2014-12-17 MED ORDER — SODIUM CHLORIDE 0.9 % IV SOLN
1000.0000 mL | Freq: Once | INTRAVENOUS | Status: AC
  Administered 2014-12-17: 1000 mL via INTRAVENOUS

## 2014-12-17 NOTE — Discharge Instructions (Signed)
Sickle Cell Anemia, Adult °Sickle cell anemia is a condition in which red blood cells have an abnormal "sickle" shape. This abnormal shape shortens the cells' life span, which results in a lower than normal concentration of red blood cells in the blood. The sickle shape also causes the cells to clump together and block free blood flow through the blood vessels. As a result, the tissues and organs of the body do not receive enough oxygen. Sickle cell anemia causes organ damage and pain and increases the risk of infection. °CAUSES  °Sickle cell anemia is a genetic disorder. Those who receive two copies of the gene have the condition, and those who receive one copy have the trait. °RISK FACTORS °The sickle cell gene is most common in people whose families originated in [place]. Other areas of the globe where sickle cell trait occurs include the Mediterranean, [place], the [place], and the [place].  °SIGNS AND SYMPTOMS °· Pain, especially in the extremities, back, chest, or abdomen (common). The pain may start suddenly or may develop following an illness, especially if there is dehydration. Pain can also occur due to overexertion or exposure to extreme temperature changes. °· Frequent severe bacterial infections, especially certain types of pneumonia and meningitis. °· Pain and swelling in the hands and feet. °· Decreased activity.   °· Loss of appetite.   °· Change in behavior. °· Headaches. °· Seizures. °· Shortness of breath or difficulty breathing. °· Vision changes. °· Skin ulcers. °Those with the trait may not have symptoms or they may have mild symptoms.  °DIAGNOSIS  °Sickle cell anemia is diagnosed with blood tests that demonstrate the genetic trait. It is often diagnosed during the newborn period, due to mandatory testing nationwide. A variety of blood tests, X-rays, CT scans, MRI scans, ultrasounds, and lung function tests may also be done to monitor the condition. °TREATMENT  °Sickle  cell anemia may be treated with: °· Medicines. You may be given pain medicines, antibiotic medicines (to treat and prevent infections) or medicines to increase the production of certain types of hemoglobin. °· Fluids. °· Oxygen. °· Blood transfusions. °HOME CARE INSTRUCTIONS  °· Drink enough fluid to keep your urine clear or pale yellow. Increase your fluid intake in hot weather and during exercise. °· Do not smoke. Smoking lowers oxygen levels in the blood.   °· Only take over-the-counter or prescription medicines for pain, fever, or discomfort as directed by your health care provider. °· Take antibiotics as directed by your health care provider. Make sure you finish them it even if you start to feel better.   °· Take supplements as directed by your health care provider.   °· Consider wearing a medical alert bracelet. This tells anyone caring for you in an emergency of your condition.   °· When traveling, keep your medical information, health care provider's names, and the medicines you take with you at all times.   °· If you develop a fever, do not take medicines to reduce the fever right away. This could cover up a problem that is developing. Notify your health care provider. °· Keep all follow-up appointments with your health care provider. Sickle cell anemia requires regular medical care. °SEEK MEDICAL CARE IF: ° You have a fever. °SEEK IMMEDIATE MEDICAL CARE IF:  °· You feel dizzy or faint.   °· You have new abdominal pain, especially on the left side near the stomach area.   °· You develop a persistent, often uncomfortable and painful penile erection (priapism). If this is not treated immediately it   will lead to impotence.   You have numbness your arms or legs or you have a hard time moving them.   You have a hard time with speech.   You have a fever or persistent symptoms for more than 2-3 days.   You have a fever and your symptoms suddenly get worse.   You have signs or symptoms of infection.  These include:   Chills.   Abnormal tiredness (lethargy).   Irritability.   Poor eating.   Vomiting.   You develop pain that is not helped with medicine.   You develop shortness of breath.  You have pain in your chest.   You are coughing up pus-like or bloody sputum.   You develop a stiff neck.  Your feet or hands swell or have pain.  Your abdomen appears bloated.  You develop joint pain. MAKE SURE YOU:  Understand these instructions. Document Released: 12/19/2005 Document Revised: 01/25/2014 Document Reviewed: 04/22/2013 [facility] Patient Information 2015 [place], [place]. This information is not intended to replace advice given to you by your health care provider. Make sure you discuss any questions you have with your health care provider.  Urinary Tract Infection Urinary tract infections (UTIs) can develop anywhere along your urinary tract. Your urinary tract is your body's drainage system for removing wastes and extra water. Your urinary tract includes two kidneys, two ureters, a bladder, and a urethra. Your kidneys are a pair of bean-shaped organs. Each kidney is about the size of your fist. They are located below your ribs, one on each side of your spine. CAUSES Infections are caused by microbes, which are microscopic organisms, including fungi, viruses, and bacteria. These organisms are so small that they can only be seen through a microscope. Bacteria are the microbes that most commonly cause UTIs. SYMPTOMS  Symptoms of UTIs may vary by age and gender of the patient and by the location of the infection. Symptoms in young women typically include a frequent and intense urge to urinate and a painful, burning feeling in the bladder or urethra during urination. Older women and men are more likely to be tired, shaky, and weak and have muscle aches and abdominal pain. A fever may mean the infection is in your kidneys. Other symptoms of a kidney infection include pain in  your back or sides below the ribs, nausea, and vomiting. DIAGNOSIS To diagnose a UTI, your caregiver will ask you about your symptoms. Your caregiver also will ask to provide a urine sample. The urine sample will be tested for bacteria and white blood cells. White blood cells are made by your body to help fight infection. TREATMENT  Typically, UTIs can be treated with medication. Because most UTIs are caused by a bacterial infection, they usually can be treated with the use of antibiotics. The choice of antibiotic and length of treatment depend on your symptoms and the type of bacteria causing your infection. HOME CARE INSTRUCTIONS  If you were prescribed antibiotics, take them exactly as your caregiver instructs you. Finish the medication even if you feel better after you have only taken some of the medication.  Drink enough water and fluids to keep your urine clear or pale yellow.  Avoid caffeine, tea, and carbonated beverages. They tend to irritate your bladder.  Empty your bladder often. Avoid holding urine for long periods of time.  Empty your bladder before and after sexual intercourse.  After a bowel movement, women should cleanse from front to back. Use each tissue only once. SEEK  MEDICAL CARE IF:   You have back pain.  You develop a fever.  Your symptoms do not begin to resolve within 3 days. SEEK IMMEDIATE MEDICAL CARE IF:   You have severe back pain or lower abdominal pain.  You develop chills.  You have nausea or vomiting.  You have continued burning or discomfort with urination. MAKE SURE YOU:   Understand these instructions.  Will watch your condition.  Will get help right away if you are not doing well or get worse. Document Released: 06/20/2005 Document Revised: 03/11/2012 Document Reviewed: 10/19/2011 [facility] Patient Information 2015 [place], [place]. This information is not intended to replace advice given to you by your health care provider. Make sure you  discuss any questions you have with your health care provider.

## 2014-12-17 NOTE — ED Notes (Signed)
Patient verbalizes understanding of discharge instructions, prescription medications, home care and follow up care. Patient ambulatory out of department at this time with family. 

## 2014-12-17 NOTE — ED Notes (Signed)
Patient ambulatory to restroom  ?

## 2014-12-17 NOTE — ED Provider Notes (Signed)
This chart was scribed for Nevis, DO by Delphia Grates, ED Scribe. This patient was seen in room APA17/APA17 and the patient's care was started at 12:02 AM.  TIME SEEN: 0002  CHIEF COMPLAINT: Sickle Cell Pain Crisis  HPI:   HPI Comments: Dawn [name] is a 34 y.o. female who presents to the Emergency Department for sickle cell pain crisis that has been ongoing this past week, worse today. Patient notes constant, 8/10, pain to the back, chest, and extremities. States this is typical of her prior sickle cell crises. Patient was seen at [facility] ED ~2 weeks ago and admitted at [facility] at the sickle cell clinic earlier today for the same. She denies lower extremity swelling or pain, fever, cough, SOB, vomiting, diarrhea. Denies history of PE, DVT, CAD, CVA. Patient state her hemoglobin is usally ~8. Reports her last transfusion was last year. States she has had acute chest syndrome in the past but reports her chest pain feels different. States that she chronically has "chest bone pain".    ROS: See HPI Constitutional: no fever  Eyes: no drainage  ENT: no runny nose   Cardiovascular:  chest pain  Resp: no SOB  GI: no vomiting GU: no dysuria Integumentary: no rash  Allergy: no hives  Musculoskeletal: no leg swelling  Neurological: no slurred speech ROS otherwise negative  PAST MEDICAL HISTORY/PAST SURGICAL HISTORY:  Past Medical History  Diagnosis Date  . Sickle cell disease   . Sickle cell disease, type S   . Blood transfusion   . 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 HCAP (healthcare-associated pneumonia) 05/19/2013  . Sickle cell anemia     MEDICATIONS:  Prior to Admission medications   Medication Sig Start Date End Date Taking? Authorizing Provider  cetirizine (ZYRTEC) 10 MG tablet Take 1 tablet (10 mg total) by mouth daily. 12/16/14   [name], FNP  ciprofloxacin (CIPRO) 250 MG tablet Take 1 tablet (250 mg total) by mouth 2 (two) times daily. 12/16/14 12/21/14   [name], FNP  folic acid (FOLVITE) 1 MG tablet Take 1 tablet (1 mg total) by mouth daily. 03/31/14   [name], FNP  hydroxyurea (HYDREA) 500 MG capsule TAKE 1 CAPSULE ON ODD DAYS AND 2 CAPSULES ON EVEN DAYS 09/02/14   [name], FNP  Oxycodone HCl 10 MG TABS Take 1 tablet (10 mg total) by mouth every 4 (four) hours as needed (moderate pain). 12/13/14   [name], FNP  promethazine (PHENERGAN) 12.5 MG tablet Take 1 tablet (12.5 mg total) by mouth every 6 (six) hours as needed for nausea. 12/13/14   [name], FNP  Vitamin D, Ergocalciferol, (DRISDOL) 50000 UNITS CAPS capsule Take 1 capsule (50,000 Units total) by mouth every 7 (seven) days. Takes on Sundays. 03/31/14   [name], FNP    ALLERGIES:  Allergies  Allergen Reactions  . Desferal [Deferoxamine] Hives    Local reaction on arm only during infusion. Can take with benadryl   . Latex Other (See Comments)    REACTION: Pt experiences a burning sensation on contacted skin areas  . Lisinopril Other (See Comments) and Cough    REACTION: Sore/scratchy throat  . Tape Other (See Comments)    REACTION: Pt. Experiences a burning sensation on contacted skin areas    SOCIAL HISTORY:  History  Substance Use Topics  . Smoking status: Never Smoker   . Smokeless tobacco: Former Systems developer  . Alcohol Use: No  FAMILY HISTORY: Family History  Problem Relation Age of Onset  . Sickle cell trait Mother   . Sickle cell trait Father   . Hypertension Father   . Diabetes Father   . Sickle cell trait Sister   . Cancer Paternal Aunt     Breast    EXAM:  BP 161/93 mmHg  Pulse 95  Temp(Src) 98.6 F (37 C) (Oral)  Resp 20  Ht 5\' 3"  (1.6 m)  Wt 160 lb (72.576 kg)  BMI 28.35 kg/m2  CONSTITUTIONAL: Alert and oriented and responds appropriately to questions. Well-appearing; well-nourished. Appears uncomfortable, but is not in distress and is nontoxic. HEAD: Normocephalic EYES: Conjunctivae clear, PERRL ENT:  normal nose; no rhinorrhea; moist mucous membranes; pharynx without lesions noted NECK: Supple, no meningismus, no LAD  CARD: RRR; S1 and S2 appreciated; no murmurs, no clicks, no rubs, no gallops RESP: Normal chest excursion without splinting or tachypnea; breath sounds clear and equal bilaterally; no wheezes, no rhonchi, no rales, no hypoxia or respiratory distress, speaking full sentences ABD/GI: Normal bowel sounds; non-distended; soft, non-tender, no rebound, no guarding BACK:  The back appears normal and is non-tender to palpation, there is no CVA tenderness EXT: Normal ROM in all joints; non-tender to palpation; no edema; normal capillary refill; no cyanosis . No calf tenderness and swelling   SKIN: Normal color for age and race; warm NEURO: Moves all extremities equally, sensation to light touch intact diffusely, cranial nerves II through XII intact PSYCH: The patient's mood and manner are appropriate. Grooming and personal hygiene are appropriate.  MEDICAL DECISION MAKING: Patient here with typical sickle cell crisis. Will obtain labs, EKG, chest x-ray. Will give IV fluids, pain and nausea medicine. She is afebrile, nontoxic appearing.  ED PROGRESS: Patient's labs show hemoglobin of 7.4 which is near her baseline. Her liver function tests including bilirubin also elevated which is also appears to be baseline per patient. Reticulocyte count is also elevated. She does appear to be nitrite positive urinary tract infection. She is asking for a dose of ceftriaxone in the ED and she states this helped with her UTIs in the past. Culture pending. Pregnancy test negative. Reports feeling much better after pain medication and now her pain is a 4/10. States she feels she can manage her pain at home. Chest x-ray is clear. EKG shows no ischemic changes. She is now smiling, laughing, nontoxic and in no distress. Will discharge home. She has follow-up with her sickle cell physician Dr. Zigmund Daniel. Discussed  return precautions. Will discharge home on Keflex. She verbalized understanding and is comfortable with plan.      EKG Interpretation  Date/Time:  Friday December 17 2014 01:49:14 EDT Ventricular Rate:  90 PR Interval:  165 QRS Duration: 94 QT Interval:  396 QTC Calculation: 484 R Axis:   74 Text Interpretation:  Sinus rhythm Borderline prolonged QT interval No significant change since last tracing Confirmed by WARD,  DO, KRISTEN ([id number]) on 12/17/2014 1:55:54 AM        I personally performed the services described in this documentation, which was scribed in my presence. The recorded information has been reviewed and is accurate.   [place], DO 12/17/14 [phone]

## 2014-12-18 LAB — URINE CULTURE

## 2014-12-19 LAB — URINE CULTURE: Colony Count: >=100000

## 2014-12-20 ENCOUNTER — Telehealth (HOSPITAL_COMMUNITY): Payer: Self-pay

## 2014-12-20 NOTE — ED Notes (Signed)
Post ED Visit - Positive Culture Follow-up  Culture report reviewed by antimicrobial stewardship pharmacist: []  Wes Woodland, Pharm.D., BCPS [x]  Heide Guile, Pharm.D., BCPS []  Alycia Rossetti, Pharm.D., BCPS []  [place], Pharm.D., BCPS, AAHIVP []  Legrand Como, Pharm.D., BCPS, AAHIVP []  Isac Sarna, Pharm.D., BCPS  Positive urine culture Treated with cephalexin, organism sensitive to the same and no further patient follow-up is required at this time.  Ileene Musa 12/20/2014, 5:29 PM

## 2014-12-21 ENCOUNTER — Encounter: Payer: Self-pay | Admitting: Family Medicine

## 2015-01-17 ENCOUNTER — Encounter (HOSPITAL_COMMUNITY): Payer: Self-pay | Admitting: Emergency Medicine

## 2015-01-17 ENCOUNTER — Emergency Department (HOSPITAL_COMMUNITY)
Admission: EM | Admit: 2015-01-17 | Discharge: 2015-01-18 | Disposition: A | Discharge status: Home / Self Care | Payer: BLUE CROSS/BLUE SHIELD | Attending: Emergency Medicine | Admitting: Emergency Medicine

## 2015-01-17 DIAGNOSIS — R609 Edema, unspecified: Secondary | ICD-10-CM | POA: Diagnosis not present

## 2015-01-17 DIAGNOSIS — Z3202 Encounter for pregnancy test, result negative: Secondary | ICD-10-CM | POA: Insufficient documentation

## 2015-01-17 DIAGNOSIS — Z8701 Personal history of pneumonia (recurrent): Secondary | ICD-10-CM | POA: Diagnosis not present

## 2015-01-17 DIAGNOSIS — Z792 Long term (current) use of antibiotics: Secondary | ICD-10-CM | POA: Diagnosis not present

## 2015-01-17 DIAGNOSIS — D5700 Hb-SS disease with crisis, unspecified: Secondary | ICD-10-CM | POA: Insufficient documentation

## 2015-01-17 DIAGNOSIS — Z91040 Latex allergy status: Secondary | ICD-10-CM | POA: Insufficient documentation

## 2015-01-17 DIAGNOSIS — Z79899 Other long term (current) drug therapy: Secondary | ICD-10-CM | POA: Insufficient documentation

## 2015-01-17 DIAGNOSIS — Z8659 Personal history of other mental and behavioral disorders: Secondary | ICD-10-CM | POA: Diagnosis not present

## 2015-01-17 LAB — COMPREHENSIVE METABOLIC PANEL
ALT: 33 U/L (ref 0–35)
AST: 86 U/L — ABNORMAL HIGH (ref 0–37)
Albumin: 3.1 g/dL — ABNORMAL LOW (ref 3.5–5.2)
Alkaline Phosphatase: 123 U/L — ABNORMAL HIGH (ref 39–117)
Anion gap: 8 (ref 5–15)
BUN: 7 mg/dL (ref 6–23)
CO2: 24 mmol/L (ref 19–32)
Calcium: 8.8 mg/dL (ref 8.4–10.5)
Chloride: 105 mmol/L (ref 96–112)
Creatinine, Ser: 0.34 mg/dL — ABNORMAL LOW (ref 0.50–1.10)
GFR calc Af Amer: >90 mL/min (ref >90)
GFR calc non Af Amer: >90 mL/min (ref >90)
GLUCOSE: 86 mg/dL (ref 70–99)
Potassium: 4 mmol/L (ref 3.5–5.1)
SODIUM: 137 mmol/L (ref 135–145)
TOTAL PROTEIN: 7.4 g/dL (ref 6.0–8.3)
Total Bilirubin: 8.9 mg/dL — ABNORMAL HIGH (ref 0.3–1.2)

## 2015-01-17 LAB — URINALYSIS, ROUTINE W REFLEX MICROSCOPIC
GLUCOSE, UA: NEGATIVE mg/dL
KETONES UR: NEGATIVE mg/dL
Leukocytes, UA: NEGATIVE
Nitrite: NEGATIVE
Protein, ur: 100 mg/dL — AB
Specific Gravity, Urine: 1.015 (ref 1.005–1.030)
Urobilinogen, UA: 1 mg/dL (ref 0.0–1.0)
pH: 6.5 (ref 5.0–8.0)

## 2015-01-17 LAB — POC URINE PREG, ED: PREG TEST UR: NEGATIVE

## 2015-01-17 LAB — CBC WITH DIFFERENTIAL/PLATELET
BASOS PCT: 1 % (ref 0–1)
Basophils Absolute: 0.1 10*3/uL (ref 0.0–0.1)
EOS ABS: 0.1 10*3/uL (ref 0.0–0.7)
EOS PCT: 2 % (ref 0–5)
HEMATOCRIT: 20.8 % — AB (ref 36.0–46.0)
Hemoglobin: 7.6 g/dL — ABNORMAL LOW (ref 12.0–15.0)
LYMPHS PCT: 38 % (ref 12–46)
Lymphs Abs: 3.1 10*3/uL (ref 0.7–4.0)
MCH: 39 pg — ABNORMAL HIGH (ref 26.0–34.0)
MCHC: 36.5 g/dL — AB (ref 30.0–36.0)
MCV: 106.7 fL — ABNORMAL HIGH (ref 78.0–100.0)
MONOS PCT: 15 % — AB (ref 3–12)
Monocytes Absolute: 1.2 10*3/uL — ABNORMAL HIGH (ref 0.1–1.0)
NEUTROS ABS: 3.7 10*3/uL (ref 1.7–7.7)
Neutrophils Relative %: 44 % (ref 43–77)
Platelets: 162 10*3/uL (ref 150–400)
RBC: 1.95 MIL/uL — AB (ref 3.87–5.11)
RDW: 20.2 % — ABNORMAL HIGH (ref 11.5–15.5)
WBC: 8.2 10*3/uL (ref 4.0–10.5)

## 2015-01-17 LAB — URINE MICROSCOPIC-ADD ON

## 2015-01-17 MED ORDER — SODIUM CHLORIDE 0.9 % IV BOLUS (SEPSIS)
1000.0000 mL | Freq: Once | INTRAVENOUS | Status: AC
  Administered 2015-01-17: 1000 mL via INTRAVENOUS

## 2015-01-17 MED ORDER — KETOROLAC TROMETHAMINE 30 MG/ML IJ SOLN
30.0000 mg | Freq: Once | INTRAMUSCULAR | Status: AC
  Administered 2015-01-17: 30 mg via INTRAVENOUS
  Filled 2015-01-17: qty 1

## 2015-01-17 MED ORDER — HYDROMORPHONE HCL 1 MG/ML IJ SOLN
1.0000 mg | Freq: Once | INTRAMUSCULAR | Status: AC
  Administered 2015-01-17: 1 mg via INTRAVENOUS
  Filled 2015-01-17: qty 1

## 2015-01-17 MED ORDER — HYDROMORPHONE HCL 2 MG/ML IJ SOLN
2.0000 mg | Freq: Once | INTRAMUSCULAR | Status: AC
  Administered 2015-01-17: 2 mg via INTRAVENOUS
  Filled 2015-01-17: qty 1

## 2015-01-17 NOTE — ED Notes (Signed)
Pt reports sickle cell pain crisis that began Sat. Pt states chest and legs hurt the worst.

## 2015-01-17 NOTE — ED Provider Notes (Signed)
CSN: [id number]     Arrival date & time 01/17/15  1649 History  This chart was scribed for Davonna Belling, MD by Eustaquio Maize, ED Scribe. This patient was seen in room APA01/APA01 and the patient's care was started at 8:22 PM.    Chief Complaint  Patient presents with  . Sickle Cell Pain Crisis    The history is provided by the patient. No language interpreter was used.     HPI Comments: Dawn [name] is a 34 y.o. female with hx sickle cell disease who presents to the Emergency Department complaining of sickle cell pain crisis that began on Saturday, 4/23 (2 days ago).  Pt reports that her pain is mostly in her lower extremities which is usual for her during flare ups. She also notes increased swelling in the LEs. She has been taking Oxycodone 10 mg without relief. Pt has been eating and drinking normally. She denies recent changes in medication. She also denies chest pain, shortness of breath, fevers, or any other symptoms. Pt reports that her Hemoglobin is usually around 8.   PCP - Zigmund Daniel   Past Medical History  Diagnosis Date  . Sickle cell disease   . Sickle cell disease, type S   . Blood transfusion   . 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 HCAP (healthcare-associated pneumonia) 05/19/2013  . Sickle cell anemia    Past Surgical History  Procedure Laterality Date  . Cholecystectomy    .  left knee acl reconstruction    . Cesarean section      x 2  . Portacath placement    . Portacath placement Left 02/23/2013    Procedure: INSERTION PORT-A-CATH;  Surgeon: Donato Heinz, MD;  Location: [facility];  Service: General;  Laterality: Left;  . Port-a-cath removal Left 07/29/2013    Procedure: REMOVAL PORT-A-CATH;  Surgeon: Scherry Ran, MD;  Location: [facility];  Service: General;  Laterality: Left;  . Tee without cardioversion N/A 08/07/2013    Procedure: TRANSESOPHAGEAL ECHOCARDIOGRAM (TEE);  Surgeon: Arnoldo Lenis, MD;  Location: [facility];  Service: Cardiology;   Laterality: N/A;   Family History  Problem Relation Age of Onset  . Sickle cell trait Mother   . Sickle cell trait Father   . Hypertension Father   . Diabetes Father   . Sickle cell trait Sister   . Cancer Paternal Aunt     Breast   History  Substance Use Topics  . Smoking status: Never Smoker   . Smokeless tobacco: Former Systems developer  . Alcohol Use: No   OB History    Gravida Para Term Preterm AB TAB SAB Ectopic Multiple Living   1 1  1      1      Review of Systems  Constitutional: Negative for fever.  HENT: Negative for rhinorrhea.   Respiratory: Negative for shortness of breath.   Cardiovascular: Positive for leg swelling. Negative for chest pain.  Gastrointestinal: Negative for nausea, vomiting, abdominal pain and diarrhea.  Musculoskeletal: Positive for arthralgias (Bilateral leg pain. ).  Neurological: Negative for speech difficulty.  Psychiatric/Behavioral: Negative for confusion.      Allergies  Desferal; Latex; Lisinopril; and Tape  Home Medications   Prior to Admission medications   Medication Sig Start Date End Date Taking? Authorizing Provider  cetirizine (ZYRTEC) 10 MG tablet Take 1 tablet (10 mg total) by mouth daily. 12/16/14  Yes [name], FNP  folic acid (FOLVITE) 1 MG tablet Take 1 tablet (1 mg  total) by mouth daily. 03/31/14  Yes [name], FNP  hydroxyurea (HYDREA) 500 MG capsule TAKE 1 CAPSULE ON ODD DAYS AND 2 CAPSULES ON EVEN DAYS 09/02/14  Yes [name], FNP  Oxycodone HCl 10 MG TABS Take 1 tablet (10 mg total) by mouth every 4 (four) hours as needed (moderate pain). 12/13/14  Yes [name], FNP  promethazine (PHENERGAN) 12.5 MG tablet Take 1 tablet (12.5 mg total) by mouth every 6 (six) hours as needed for nausea. 12/13/14  Yes [name], FNP  Vitamin D, Ergocalciferol, (DRISDOL) 50000 UNITS CAPS capsule Take 1 capsule (50,000 Units total) by mouth every 7 (seven) days. Takes on Sundays. 03/31/14  Yes [name], FNP   cephALEXin (KEFLEX) 500 MG capsule Take 1 capsule (500 mg total) by mouth 2 (two) times daily. Patient not taking: Reported on 01/17/2015 12/17/14   Delice Bison Ward, DO   Triage Vitals: BP 147/90 mmHg  Pulse 103  Temp(Src) 99.2 F (37.3 C) (Oral)  Resp 24  Ht 5\' 3"  (1.6 m)  Wt 160 lb (72.576 kg)  BMI 28.35 kg/m2  SpO2 99%   Physical Exam  Constitutional: She is oriented to person, place, and time. She appears well-developed and well-nourished. No distress.  HENT:  Head: Normocephalic and atraumatic.  Eyes: Conjunctivae and EOM are normal.  Neck: Neck supple. No tracheal deviation present.  Cardiovascular: Normal rate, regular rhythm and normal heart sounds.   Pulmonary/Chest: Effort normal and breath sounds normal. No respiratory distress.  Abdominal: Soft. There is no tenderness.  Musculoskeletal: Normal range of motion. She exhibits edema and tenderness.  Mild tenderness and edema to left lower extremity; No erythema; Strong pulse.   Neurological: She is alert and oriented to person, place, and time.  Skin: Skin is warm and dry.  Psychiatric: She has a normal mood and affect. Her behavior is normal.  Nursing note and vitals reviewed.   ED Course  Procedures (including critical care time)  DIAGNOSTIC STUDIES: Oxygen Saturation is 99% on RA, normal by my interpretation.    COORDINATION OF CARE: 8:25 PM-Discussed treatment plan  with pt at bedside and pt agreed to plan.   Labs Review Labs Reviewed  CBC WITH DIFFERENTIAL/PLATELET - Abnormal; Notable for the following:    RBC 1.95 (*)    Hemoglobin 7.6 (*)    HCT 20.8 (*)    MCV 106.7 (*)    MCH 39.0 (*)    MCHC 36.5 (*)    RDW 20.2 (*)    Monocytes Relative 15 (*)    Monocytes Absolute 1.2 (*)    All other components within normal limits  COMPREHENSIVE METABOLIC PANEL - Abnormal; Notable for the following:    Creatinine, Ser 0.34 (*)    Albumin 3.1 (*)    AST 86 (*)    Alkaline Phosphatase 123 (*)    Total  Bilirubin 8.9 (*)    All other components within normal limits  URINALYSIS, ROUTINE W REFLEX MICROSCOPIC - Abnormal; Notable for the following:    Color, Urine AMBER (*)    Hgb urine dipstick MODERATE (*)    Bilirubin Urine MODERATE (*)    Protein, ur 100 (*)    All other components within normal limits  URINE MICROSCOPIC-ADD ON - Abnormal; Notable for the following:    Squamous Epithelial / LPF FEW (*)    Bacteria, UA FEW (*)    All other components within normal limits  POC URINE PREG, ED  Imaging Review No results found.   EKG Interpretation None      MDM   Final diagnoses:  Sickle cell disease with crisis   patient with sickle cell pain. It is her typical pain. Feels better after treatment. Will discharge home.  I personally performed the services described in this documentation, which was scribed in my presence. The recorded information has been reviewed and is accurate.       Davonna Belling, MD 01/18/15 [phone]

## 2015-01-17 NOTE — Discharge Instructions (Signed)
Sickle Cell Anemia, Adult °Sickle cell anemia is a condition in which red blood cells have an abnormal "sickle" shape. This abnormal shape shortens the cells' life span, which results in a lower than normal concentration of red blood cells in the blood. The sickle shape also causes the cells to clump together and block free blood flow through the blood vessels. As a result, the tissues and organs of the body do not receive enough oxygen. Sickle cell anemia causes organ damage and pain and increases the risk of infection. °CAUSES  °Sickle cell anemia is a genetic disorder. Those who receive two copies of the gene have the condition, and those who receive one copy have the trait. °RISK FACTORS °The sickle cell gene is most common in people whose families originated in [place]. Other areas of the globe where sickle cell trait occurs include the [place], [place] and [place], the [place], and the [place].  °SIGNS AND SYMPTOMS °· Pain, especially in the extremities, back, chest, or abdomen (common). The pain may start suddenly or may develop following an illness, especially if there is dehydration. Pain can also occur due to overexertion or exposure to extreme temperature changes. °· Frequent severe bacterial infections, especially certain types of pneumonia and meningitis. °· Pain and swelling in the hands and feet. °· Decreased activity.   °· Loss of appetite.   °· Change in behavior. °· Headaches. °· Seizures. °· Shortness of breath or difficulty breathing. °· Vision changes. °· Skin ulcers. °Those with the trait may not have symptoms or they may have mild symptoms.  °DIAGNOSIS  °Sickle cell anemia is diagnosed with blood tests that demonstrate the genetic trait. It is often diagnosed during the newborn period, due to mandatory testing nationwide. A variety of blood tests, X-rays, CT scans, MRI scans, ultrasounds, and lung function tests may also be done to monitor the condition. °TREATMENT  °Sickle  cell anemia may be treated with: °· Medicines. You may be given pain medicines, antibiotic medicines (to treat and prevent infections) or medicines to increase the production of certain types of hemoglobin. °· Fluids. °· Oxygen. °· Blood transfusions. °HOME CARE INSTRUCTIONS  °· Drink enough fluid to keep your urine clear or pale yellow. Increase your fluid intake in hot weather and during exercise. °· Do not smoke. Smoking lowers oxygen levels in the blood.   °· Only take over-the-counter or prescription medicines for pain, fever, or discomfort as directed by your health care provider. °· Take antibiotics as directed by your health care provider. Make sure you finish them it even if you start to feel better.   °· Take supplements as directed by your health care provider.   °· Consider wearing a medical alert bracelet. This tells anyone caring for you in an emergency of your condition.   °· When traveling, keep your medical information, health care provider's names, and the medicines you take with you at all times.   °· If you develop a fever, do not take medicines to reduce the fever right away. This could cover up a problem that is developing. Notify your health care provider. °· Keep all follow-up appointments with your health care provider. Sickle cell anemia requires regular medical care. °SEEK MEDICAL CARE IF: ° You have a fever. °SEEK IMMEDIATE MEDICAL CARE IF:  °· You feel dizzy or faint.   °· You have new abdominal pain, especially on the left side near the stomach area.   °· You develop a persistent, often uncomfortable and painful penile erection (priapism). If this is not treated immediately it   will lead to impotence.   °· You have numbness your arms or legs or you have a hard time moving them.   °· You have a hard time with speech.   °· You have a fever or persistent symptoms for more than 2-3 days.   °· You have a fever and your symptoms suddenly get worse.   °· You have signs or symptoms of infection.  These include:   °¨ Chills.   °¨ Abnormal tiredness (lethargy).   °¨ Irritability.   °¨ Poor eating.   °¨ Vomiting.   °· You develop pain that is not helped with medicine.   °· You develop shortness of breath. °· You have pain in your chest.   °· You are coughing up pus-like or bloody sputum.   °· You develop a stiff neck. °· Your feet or hands swell or have pain. °· Your abdomen appears bloated. °· You develop joint pain. °MAKE SURE YOU: °· Understand these instructions. °Document Released: 12/19/2005 Document Revised: 01/25/2014 Document Reviewed: 04/22/2013 °[employer]® Patient Information ©2015 [employer]. This information is not intended to replace advice given to you by your health care provider. Make sure you discuss any questions you have with your health care provider. ° °

## 2015-02-01 ENCOUNTER — Encounter (HOSPITAL_COMMUNITY): Payer: Self-pay | Admitting: Emergency Medicine

## 2015-02-01 ENCOUNTER — Emergency Department (HOSPITAL_COMMUNITY): Payer: BLUE CROSS/BLUE SHIELD

## 2015-02-01 ENCOUNTER — Inpatient Hospital Stay (HOSPITAL_COMMUNITY)
Admission: EM | Admit: 2015-02-01 | Discharge: 2015-02-09 | DRG: 812 | Disposition: A | Discharge status: Home / Self Care | Payer: BLUE CROSS/BLUE SHIELD | Attending: Internal Medicine | Admitting: Internal Medicine

## 2015-02-01 DIAGNOSIS — D7589 Other specified diseases of blood and blood-forming organs: Secondary | ICD-10-CM | POA: Diagnosis present

## 2015-02-01 DIAGNOSIS — D571 Sickle-cell disease without crisis: Secondary | ICD-10-CM | POA: Diagnosis present

## 2015-02-01 DIAGNOSIS — R079 Chest pain, unspecified: Secondary | ICD-10-CM | POA: Diagnosis present

## 2015-02-01 DIAGNOSIS — R7989 Other specified abnormal findings of blood chemistry: Secondary | ICD-10-CM | POA: Diagnosis not present

## 2015-02-01 DIAGNOSIS — F411 Generalized anxiety disorder: Secondary | ICD-10-CM | POA: Diagnosis present

## 2015-02-01 DIAGNOSIS — R0600 Dyspnea, unspecified: Secondary | ICD-10-CM

## 2015-02-01 DIAGNOSIS — D5700 Hb-SS disease with crisis, unspecified: Principal | ICD-10-CM | POA: Diagnosis present

## 2015-02-01 DIAGNOSIS — Z8249 Family history of ischemic heart disease and other diseases of the circulatory system: Secondary | ICD-10-CM | POA: Diagnosis not present

## 2015-02-01 DIAGNOSIS — Z87891 Personal history of nicotine dependence: Secondary | ICD-10-CM | POA: Diagnosis not present

## 2015-02-01 DIAGNOSIS — Z833 Family history of diabetes mellitus: Secondary | ICD-10-CM

## 2015-02-01 DIAGNOSIS — R945 Abnormal results of liver function studies: Secondary | ICD-10-CM

## 2015-02-01 DIAGNOSIS — R0789 Other chest pain: Secondary | ICD-10-CM | POA: Diagnosis not present

## 2015-02-01 DIAGNOSIS — D57219 Sickle-cell/Hb-C disease with crisis, unspecified: Secondary | ICD-10-CM | POA: Diagnosis not present

## 2015-02-01 LAB — CBC WITH DIFFERENTIAL/PLATELET
Basophils Absolute: 0 10*3/uL (ref 0.0–0.1)
Basophils Relative: 0 % (ref 0–1)
EOS PCT: 0 % (ref 0–5)
Eosinophils Absolute: 0 10*3/uL (ref 0.0–0.7)
HCT: 24.1 % — ABNORMAL LOW (ref 36.0–46.0)
HEMOGLOBIN: 8.7 g/dL — AB (ref 12.0–15.0)
LYMPHS ABS: 0.6 10*3/uL — AB (ref 0.7–4.0)
Lymphocytes Relative: 9 % — ABNORMAL LOW (ref 12–46)
MCH: 38.8 pg — ABNORMAL HIGH (ref 26.0–34.0)
MCHC: 36.1 g/dL — ABNORMAL HIGH (ref 30.0–36.0)
MCV: 107.6 fL — AB (ref 78.0–100.0)
MONOS PCT: 5 % (ref 3–12)
Monocytes Absolute: 0.3 10*3/uL (ref 0.1–1.0)
NEUTROS PCT: 86 % — AB (ref 43–77)
Neutro Abs: 6.2 10*3/uL (ref 1.7–7.7)
PLATELETS: 210 10*3/uL (ref 150–400)
RBC: 2.24 MIL/uL — AB (ref 3.87–5.11)
RDW: 20.6 % — ABNORMAL HIGH (ref 11.5–15.5)
WBC: 7.2 10*3/uL (ref 4.0–10.5)

## 2015-02-01 LAB — HEPATIC FUNCTION PANEL
ALT: 35 U/L (ref 14–54)
AST: 88 U/L — ABNORMAL HIGH (ref 15–41)
Albumin: 3.2 g/dL — ABNORMAL LOW (ref 3.5–5.0)
Alkaline Phosphatase: 141 U/L — ABNORMAL HIGH (ref 38–126)
BILIRUBIN TOTAL: 10.6 mg/dL — AB (ref 0.3–1.2)
Bilirubin, Direct: 3.8 mg/dL — ABNORMAL HIGH (ref 0.1–0.5)
Indirect Bilirubin: 6.8 mg/dL — ABNORMAL HIGH (ref 0.3–0.9)
Total Protein: 7.8 g/dL (ref 6.5–8.1)

## 2015-02-01 LAB — RETICULOCYTES
RBC.: 2.24 MIL/uL — ABNORMAL LOW (ref 3.87–5.11)
RBC.: 2.24 MIL/uL — AB (ref 3.87–5.11)
RETIC COUNT ABSOLUTE: 504 10*3/uL — AB (ref 19.0–186.0)
RETIC CT PCT: 19.3 % — AB (ref 0.4–3.1)
Retic Count, Absolute: 432.3 10*3/uL — ABNORMAL HIGH (ref 19.0–186.0)
Retic Ct Pct: 22.5 % — ABNORMAL HIGH (ref 0.4–3.1)

## 2015-02-01 LAB — TROPONIN I: Troponin I: <0.03 ng/mL (ref <0.031)

## 2015-02-01 LAB — BASIC METABOLIC PANEL
ANION GAP: 7 (ref 5–15)
BUN: 8 mg/dL (ref 6–20)
CALCIUM: 8.5 mg/dL — AB (ref 8.9–10.3)
CO2: 24 mmol/L (ref 22–32)
CREATININE: 0.39 mg/dL — AB (ref 0.44–1.00)
Chloride: 105 mmol/L (ref 101–111)
GFR calc non Af Amer: >60 mL/min (ref >60)
Glucose, Bld: 92 mg/dL (ref 70–99)
Potassium: 3.8 mmol/L (ref 3.5–5.1)
Sodium: 136 mmol/L (ref 135–145)

## 2015-02-01 LAB — LACTATE DEHYDROGENASE: LDH: 413 U/L — ABNORMAL HIGH (ref 98–192)

## 2015-02-01 LAB — MAGNESIUM: Magnesium: 1.6 mg/dL — ABNORMAL LOW (ref 1.7–2.4)

## 2015-02-01 MED ORDER — SODIUM CHLORIDE 0.9 % IV BOLUS (SEPSIS)
1000.0000 mL | Freq: Once | INTRAVENOUS | Status: AC
  Administered 2015-02-01: 1000 mL via INTRAVENOUS

## 2015-02-01 MED ORDER — PROMETHAZINE HCL 12.5 MG PO TABS
12.5000 mg | ORAL_TABLET | ORAL | Status: DC | PRN
  Administered 2015-02-01 – 2015-02-02 (×3): 12.5 mg via ORAL
  Administered 2015-02-02 – 2015-02-09 (×5): 25 mg via ORAL
  Filled 2015-02-01: qty 1
  Filled 2015-02-01 (×4): qty 2
  Filled 2015-02-01 (×2): qty 1
  Filled 2015-02-01: qty 2

## 2015-02-01 MED ORDER — PANTOPRAZOLE SODIUM 40 MG IV SOLR
40.0000 mg | Freq: Every day | INTRAVENOUS | Status: DC
  Filled 2015-02-01: qty 40

## 2015-02-01 MED ORDER — SODIUM CHLORIDE 0.9 % IJ SOLN: 9.0000 mL | INTRAMUSCULAR | Status: DC | PRN

## 2015-02-01 MED ORDER — HYDROXYUREA 500 MG PO CAPS
1000.0000 mg | ORAL_CAPSULE | Freq: Every day | ORAL | Status: DC
  Administered 2015-02-01 – 2015-02-09 (×9): 1000 mg via ORAL
  Filled 2015-02-01 (×11): qty 2

## 2015-02-01 MED ORDER — VITAMIN D (ERGOCALCIFEROL) 1.25 MG (50000 UNIT) PO CAPS
50000.0000 [IU] | ORAL_CAPSULE | ORAL | Status: DC
  Administered 2015-02-06: 50000 [IU] via ORAL
  Filled 2015-02-01: qty 1

## 2015-02-01 MED ORDER — ENOXAPARIN SODIUM 40 MG/0.4ML ~~LOC~~ SOLN
40.0000 mg | SUBCUTANEOUS | Status: DC
  Administered 2015-02-01 – 2015-02-08 (×8): 40 mg via SUBCUTANEOUS
  Filled 2015-02-01 (×9): qty 0.4

## 2015-02-01 MED ORDER — DIPHENHYDRAMINE HCL 25 MG PO CAPS
25.0000 mg | ORAL_CAPSULE | ORAL | Status: DC | PRN
  Administered 2015-02-01 – 2015-02-03 (×3): 25 mg via ORAL
  Administered 2015-02-05: 50 mg via ORAL
  Administered 2015-02-06: 25 mg via ORAL
  Administered 2015-02-07: 50 mg via ORAL
  Administered 2015-02-08: 25 mg via ORAL
  Administered 2015-02-08: 50 mg via ORAL
  Administered 2015-02-08 – 2015-02-09 (×2): 25 mg via ORAL
  Filled 2015-02-01 (×3): qty 1
  Filled 2015-02-01 (×2): qty 2
  Filled 2015-02-01 (×4): qty 1
  Filled 2015-02-01: qty 2
  Filled 2015-02-01: qty 1

## 2015-02-01 MED ORDER — ACETAMINOPHEN 325 MG PO TABS
650.0000 mg | ORAL_TABLET | Freq: Four times a day (QID) | ORAL | Status: DC | PRN
  Administered 2015-02-01 – 2015-02-05 (×3): 650 mg via ORAL
  Filled 2015-02-01 (×3): qty 2

## 2015-02-01 MED ORDER — HYDROMORPHONE 0.3 MG/ML IV SOLN
Freq: Once | INTRAVENOUS | Status: DC
  Filled 2015-02-01 (×2): qty 25

## 2015-02-01 MED ORDER — PANTOPRAZOLE SODIUM 40 MG PO TBEC
40.0000 mg | DELAYED_RELEASE_TABLET | Freq: Every day | ORAL | Status: DC
  Administered 2015-02-01 – 2015-02-09 (×9): 40 mg via ORAL
  Filled 2015-02-01 (×9): qty 1

## 2015-02-01 MED ORDER — NALOXONE HCL 0.4 MG/ML IJ SOLN: 0.4000 mg | INTRAMUSCULAR | Status: DC | PRN

## 2015-02-01 MED ORDER — FOLIC ACID 1 MG PO TABS: 1.0000 mg | ORAL_TABLET | Freq: Every day | ORAL | Status: DC

## 2015-02-01 MED ORDER — HYDROMORPHONE HCL 2 MG/ML IJ SOLN
2.0000 mg | Freq: Once | INTRAMUSCULAR | Status: AC
  Administered 2015-02-01: 2 mg via INTRAVENOUS
  Filled 2015-02-01: qty 1

## 2015-02-01 MED ORDER — SODIUM CHLORIDE 0.9 % IV SOLN
25.0000 mg | INTRAVENOUS | Status: DC | PRN
  Administered 2015-02-05 – 2015-02-06 (×2): 25 mg via INTRAVENOUS
  Filled 2015-02-01 (×3): qty 0.5

## 2015-02-01 MED ORDER — TEMAZEPAM 15 MG PO CAPS
15.0000 mg | ORAL_CAPSULE | Freq: Every evening | ORAL | Status: DC | PRN
  Administered 2015-02-01 – 2015-02-07 (×5): 15 mg via ORAL
  Filled 2015-02-01 (×5): qty 1

## 2015-02-01 MED ORDER — HYDROMORPHONE 0.3 MG/ML IV SOLN
INTRAVENOUS | Status: DC
  Administered 2015-02-01 (×2): via INTRAVENOUS
  Administered 2015-02-02: 0.9 mg via INTRAVENOUS
  Administered 2015-02-02: 4.04 mg via INTRAVENOUS
  Administered 2015-02-02: 14:00:00 via INTRAVENOUS
  Administered 2015-02-02: 3.3 mg via INTRAVENOUS
  Administered 2015-02-03: 1.8 mL via INTRAVENOUS
  Administered 2015-02-03 (×2): 1.8 mg via INTRAVENOUS
  Administered 2015-02-03: 2.7 mg via INTRAVENOUS
  Administered 2015-02-03: 1.2 mg via INTRAVENOUS
  Administered 2015-02-03 – 2015-02-04 (×3): via INTRAVENOUS
  Administered 2015-02-04: 0.3 mg via INTRAVENOUS
  Administered 2015-02-04: 2.7 mg via INTRAVENOUS
  Administered 2015-02-05: 13:00:00 via INTRAVENOUS
  Administered 2015-02-05: 1.2 mg via INTRAVENOUS
  Administered 2015-02-05: 2.3 mg via INTRAVENOUS
  Administered 2015-02-05: 3 mg via INTRAVENOUS
  Administered 2015-02-06: 0.9 mg via INTRAVENOUS
  Administered 2015-02-06: 2.7 mg via INTRAVENOUS
  Administered 2015-02-06: 3.3 mg via INTRAVENOUS
  Administered 2015-02-06: 17:00:00 via INTRAVENOUS
  Administered 2015-02-07: 1.2 mg via INTRAVENOUS
  Administered 2015-02-07: 1.8 mg via INTRAVENOUS
  Administered 2015-02-07: 2.4 mg via INTRAVENOUS
  Administered 2015-02-07: 1.36 mg via INTRAVENOUS
  Administered 2015-02-07: 14:00:00 via INTRAVENOUS
  Administered 2015-02-07: 1.8 mg via INTRAVENOUS
  Administered 2015-02-08: 0.6 mg via INTRAVENOUS
  Administered 2015-02-08: 3 mg via INTRAVENOUS
  Administered 2015-02-08: 1.8 mg via INTRAVENOUS
  Administered 2015-02-08: 06:00:00 via INTRAVENOUS
  Filled 2015-02-01 (×10): qty 25

## 2015-02-01 MED ORDER — KETOROLAC TROMETHAMINE 15 MG/ML IJ SOLN
15.0000 mg | Freq: Four times a day (QID) | INTRAMUSCULAR | Status: AC
  Administered 2015-02-01 – 2015-02-06 (×20): 15 mg via INTRAVENOUS
  Filled 2015-02-01 (×20): qty 1

## 2015-02-01 MED ORDER — LORATADINE 10 MG PO TABS
10.0000 mg | ORAL_TABLET | Freq: Every day | ORAL | Status: DC
  Administered 2015-02-01 – 2015-02-09 (×9): 10 mg via ORAL
  Filled 2015-02-01 (×9): qty 1

## 2015-02-01 MED ORDER — SENNOSIDES-DOCUSATE SODIUM 8.6-50 MG PO TABS
1.0000 | ORAL_TABLET | Freq: Two times a day (BID) | ORAL | Status: DC
  Administered 2015-02-02 – 2015-02-09 (×7): 1 via ORAL
  Filled 2015-02-01 (×12): qty 1

## 2015-02-01 MED ORDER — PROMETHAZINE HCL 25 MG/ML IJ SOLN
25.0000 mg | Freq: Once | INTRAMUSCULAR | Status: AC
  Administered 2015-02-01: 25 mg via INTRAVENOUS
  Filled 2015-02-01: qty 1

## 2015-02-01 MED ORDER — PROMETHAZINE HCL 12.5 MG RE SUPP
12.5000 mg | RECTAL | Status: DC | PRN
  Filled 2015-02-01: qty 2

## 2015-02-01 MED ORDER — SODIUM CHLORIDE 0.45 % IV SOLN
INTRAVENOUS | Status: DC
  Administered 2015-02-01 – 2015-02-03 (×4): via INTRAVENOUS
  Administered 2015-02-04 – 2015-02-05 (×2): 125 mL/h via INTRAVENOUS
  Administered 2015-02-06 – 2015-02-09 (×7): via INTRAVENOUS

## 2015-02-01 MED ORDER — HYDROMORPHONE HCL 2 MG/ML IJ SOLN: 2.0000 mg | Freq: Once | INTRAMUSCULAR | Status: DC

## 2015-02-01 MED ORDER — FOLIC ACID 1 MG PO TABS
1.0000 mg | ORAL_TABLET | Freq: Every day | ORAL | Status: DC
  Administered 2015-02-01 – 2015-02-09 (×9): 1 mg via ORAL
  Filled 2015-02-01 (×9): qty 1

## 2015-02-01 MED ORDER — POLYETHYLENE GLYCOL 3350 17 G PO PACK: 17.0000 g | PACK | Freq: Every day | ORAL | Status: DC | PRN

## 2015-02-01 MED ORDER — HYDROMORPHONE 0.3 MG/ML IV SOLN
INTRAVENOUS | Status: DC
  Filled 2015-02-01: qty 25

## 2015-02-01 NOTE — H&P (Signed)
[facility] History and Physical  Dawn [name] PPJ:[id number] DOB: March 06, 1981 DOA: 02/01/2015  Referring physician: Sabra Heck PCP: [name],[name], MD   Chief Complaint: chest pain   HPI: Dawn [name] is a 34 y.o. female known to our service with known Hb SS and known to be compliant with disease management presents to the emergency department with one-day history of left upper chest and back pain worsening and persistent. Initial evaluation in the emergency department reveals mild tachycardia, vomiting, uncontrolled pain. Patient states she was in her usual state of health until yesterday about 2:00 which he developed left anterior chest pain radiating to her side and upper back. She rates the pain a 10 out of 10 and describes it as throbbing and constant. She took her home pain medicine without relief. She states pain in her back is typical of her crisis pain however pain on the left chest somewhat reminiscent of when she had acute chest syndrome as a teenager. In addition this morning she awakened with shortness of breath. She denies cough chills abdominal pain diarrhea nausea or vomiting. She does report subjective fever. He denies dysuria hematuria frequency or urgency. She denies headache visual disturbances numbness or tingling of extremities. Workup in the emergency department includes complete blood count significant for hemoglobin of 8.7 hematocrit of 58.0, basic metabolic panel significant for creatinine of 0.39 calcium of 8.5. Recheck count 22.5. Initial troponin negative, hepatic function panel AST 88, alkaline phosphatase 141 total bili 10.6.  In the emergency department max temp 99.2 mildly tachycardic with a rate of 105 hemodynamically stable and not hypoxic.  In the emergency department she is provided with 1 L of normal saline 2 mg of Dilaudid 225 mg of Phenergan 2. At the time of my exam she continues to complain of pain and vomited approximately 500 mL of thick greenish  emesis.   Review of Systems:  10 point review of systems complete and all systems are negative except as indicated in the history of present illness  Past Medical History  Diagnosis Date  . Sickle cell disease   . Sickle cell disease, type S   . Blood transfusion   . 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 HCAP (healthcare-associated pneumonia) 05/19/2013  . Sickle cell anemia    Past Surgical History  Procedure Laterality Date  . Cholecystectomy    .  left knee acl reconstruction    . Cesarean section      x 2  . Portacath placement    . Portacath placement Left 02/23/2013    Procedure: INSERTION PORT-A-CATH;  Surgeon: Donato Heinz, MD;  Location: [facility];  Service: General;  Laterality: Left;  . Port-a-cath removal Left 07/29/2013    Procedure: REMOVAL PORT-A-CATH;  Surgeon: Scherry Ran, MD;  Location: [facility];  Service: General;  Laterality: Left;  . Tee without cardioversion N/A 08/07/2013    Procedure: TRANSESOPHAGEAL ECHOCARDIOGRAM (TEE);  Surgeon: Arnoldo Lenis, MD;  Location: [facility];  Service: Cardiology;  Laterality: N/A;   Social History:  reports that she has never smoked. She has quit using smokeless tobacco. She reports that she does not drink alcohol or use illicit drugs. She is independent with ADLs she is employed  Allergies  Allergen Reactions  . Desferal [Deferoxamine] Hives    Local reaction on arm only during infusion. Can take with benadryl   . Latex Other (See Comments)    REACTION: Pt experiences a burning sensation on contacted skin areas  . Lisinopril  Other (See Comments) and Cough    REACTION: Sore/scratchy throat  . Tape Other (See Comments)    REACTION: Pt. Experiences a burning sensation on contacted skin areas    Family History  Problem Relation Age of Onset  . Sickle cell trait Mother   . Sickle cell trait Father   . Hypertension Father   . Diabetes Father   . Sickle cell trait Sister   . Cancer Paternal Aunt      Breast     Prior to Admission medications   Medication Sig Start Date End Date Taking? Authorizing Provider  folic acid (FOLVITE) 1 MG tablet Take 1 tablet (1 mg total) by mouth daily. 03/31/14  Yes [name], FNP  hydroxyurea (HYDREA) 500 MG capsule TAKE 1 CAPSULE ON ODD DAYS AND 2 CAPSULES ON EVEN DAYS 09/02/14  Yes [name], FNP  loratadine (CLARITIN) 10 MG tablet Take 10 mg by mouth daily.   Yes Historical Provider, MD  Oxycodone HCl 10 MG TABS Take 1 tablet (10 mg total) by mouth every 4 (four) hours as needed (moderate pain). 12/13/14  Yes [name], FNP  promethazine (PHENERGAN) 12.5 MG tablet Take 1 tablet (12.5 mg total) by mouth every 6 (six) hours as needed for nausea. 12/13/14  Yes [name], FNP  Vitamin D, Ergocalciferol, (DRISDOL) 50000 UNITS CAPS capsule Take 1 capsule (50,000 Units total) by mouth every 7 (seven) days. Takes on Sundays. 03/31/14  Yes [name], FNP  cephALEXin (KEFLEX) 500 MG capsule Take 1 capsule (500 mg total) by mouth 2 (two) times daily. Patient not taking: Reported on 01/17/2015 12/17/14   Delice Bison Ward, DO  cetirizine (ZYRTEC) 10 MG tablet Take 1 tablet (10 mg total) by mouth daily. Patient not taking: Reported on 02/01/2015 12/16/14   [name], FNP   Physical Exam: Filed Vitals:   02/01/15 0835 02/01/15 0915 02/01/15 1030 02/01/15 1100  BP: 108/66 105/56 117/60 124/76  Pulse: 102  106 101  Temp: 99.2 F (37.3 C)     TempSrc: Oral     Resp: 17 17 16    Height:      Weight:      SpO2: 97%   92%    Wt Readings from Last 3 Encounters:  02/01/15 72.576 kg (160 lb)  01/17/15 72.576 kg (160 lb)  12/16/14 72.576 kg (160 lb)    General:  Appears calm and uncomfortable. Vomiting  Eyes: PERRL, normal lids, irises & conjunctiva ENT: grossly normal hearing, lips & tongue Neck: no LAD, masses or thyromegaly Cardiovascular: Tachycardic but regular, no m/r/g. No LE edema.  Respiratory: CTA bilaterally, no w/r/r.  Normal respiratory effort. Abdomen: soft, ntnd positive bowel sounds  Skin: no rash or induration seen on limited exam Musculoskeletal: grossly normal tone BUE/BLE Psychiatric: grossly normal mood and affect, speech fluent and appropriate Neurologic: grossly non-focal. Speech clear           Labs on Admission:  Basic Metabolic Panel:  Recent Labs Lab 02/01/15 0721  NA 136  K 3.8  CL 105  CO2 24  GLUCOSE 92  BUN 8  CREATININE 0.39*  CALCIUM 8.5*   Liver Function Tests:  Recent Labs Lab 02/01/15 0721  AST 88*  ALT 35  ALKPHOS 141*  BILITOT 10.6*  PROT 7.8  ALBUMIN 3.2*   No results for input(s): LIPASE, AMYLASE in the last 168 hours. No results for input(s): AMMONIA in the last 168 hours. CBC:  Recent Labs Lab  02/01/15 0721  WBC 7.2  NEUTROABS 6.2  HGB 8.7*  HCT 24.1*  MCV 107.6*  PLT 210   Cardiac Enzymes:  Recent Labs Lab 02/01/15 0721  TROPONINI <0.03    BNP (last 3 results) No results for input(s): BNP in the last 8760 hours.  ProBNP (last 3 results) No results for input(s): PROBNP in the last 8760 hours.  CBG: No results for input(s): GLUCAP in the last 168 hours.  Radiological Exams on Admission: Dg Chest 2 View  02/01/2015   CLINICAL DATA:  34 year old female with left chest pain since last night. Initial encounter. Current history of sickle cell disease.  EXAM: CHEST  2 VIEW  COMPARISON:  12/17/2014 and earlier.  FINDINGS: Stable lung volumes. Normal cardiac size and mediastinal contours. Visualized tracheal air column is within normal limits. No pneumothorax, pulmonary edema, pleural effusion or confluent pulmonary opacity. Stable cholecystectomy clips. Stable bilateral humeral head sclerosis. Elsewhere bone mineralization appears within normal limits. No acute osseous abnormality identified.  IMPRESSION: 1.  No acute cardiopulmonary abnormality. 2. 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    Electronically Signed   By: Genevie Ann M.D.   On: 02/01/2015 07:29    EKG: Independently reviewed sinus rhythm with borderline QT no acute changes  Assessment/Plan Principal Problem:   Sickle cell crisis: Admit to telemetry. Provide IV fluids 125 an hour. Dose Dilaudid PCA, Toradol, oxygen supplementation as needed and warm compresses as indicated. Will repeat CBC and reticulocyte count in the morning. Current hemoglobin above 8 no indication for transfusion at this time. Active Problems: Chest pain; likely related to #1. Initial troponin negative. EKG without acute changes. Cycle troponin. Chest x-ray no acute cardiopulmonary abnormality. Management as above. Oxygen supplementation as indicated. ED note refers to hypoxia however patient's oxygen saturation levels been greater than 92% on room air. She has no leukocytosis max temp 99.5. Respiratory effort normal. If no improvement repeat chest x-ray    Sickle cell disease/anemia: Patient with elevated bilirubin, check 22.5. Will obtain LDH. Concern for hemolysis. Will monitor for transfusion needs. 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    Elevated LFTs: Related to #1. Monitor closely      Anxiety state: There is stable at baseline. Opinion home meds   Code Status: full DVT Prophylaxis: Family Communication: none present Disposition Plan: home when ready  Time spent: 31 minutes  [facility] Pager [phone]

## 2015-02-01 NOTE — ED Notes (Signed)
Pt c/o chest/back pain that started last night.

## 2015-02-01 NOTE — ED Notes (Signed)
Given ginger ale per request

## 2015-02-01 NOTE — Progress Notes (Signed)
Patient has temperature of 102.5. Dr. Jerilee Hoh paged to notify that patient has temperature and does not have tylenol ordered. Toradol was given.

## 2015-02-01 NOTE — ED Provider Notes (Signed)
CSN: [id number]     Arrival date & time 02/01/15  0601 History   First MD Initiated Contact with Patient 02/01/15 [phone]     Chief Complaint  Patient presents with  . Chest Pain     (Consider location/radiation/quality/duration/timing/severity/associated sxs/prior Treatment) HPI  The pt complaints of L upper chest pain and back pain - this started at 2 PM yesterday has been constant, worsening and associated with mild SOB - she has had "low grade fever", has not had vomiting, diarrhea, or cough.  She has had her oxycocodone without relief at home.   Sx are constant and gradually worsening  Past Medical History  Diagnosis Date  . Sickle cell disease   . Sickle cell disease, type S   . Blood transfusion   . 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 HCAP (healthcare-associated pneumonia) 05/19/2013  . Sickle cell anemia    Past Surgical History  Procedure Laterality Date  . Cholecystectomy    .  left knee acl reconstruction    . Cesarean section      x 2  . Portacath placement    . Portacath placement Left 02/23/2013    Procedure: INSERTION PORT-A-CATH;  Surgeon: Donato Heinz, MD;  Location: [facility];  Service: General;  Laterality: Left;  . Port-a-cath removal Left 07/29/2013    Procedure: REMOVAL PORT-A-CATH;  Surgeon: Scherry Ran, MD;  Location: [facility];  Service: General;  Laterality: Left;  . Tee without cardioversion N/A 08/07/2013    Procedure: TRANSESOPHAGEAL ECHOCARDIOGRAM (TEE);  Surgeon: Arnoldo Lenis, MD;  Location: [facility];  Service: Cardiology;  Laterality: N/A;   Family History  Problem Relation Age of Onset  . Sickle cell trait Mother   . Sickle cell trait Father   . Hypertension Father   . Diabetes Father   . Sickle cell trait Sister   . Cancer Paternal Aunt     Breast   History  Substance Use Topics  . Smoking status: Never Smoker   . Smokeless tobacco: Former Systems developer  . Alcohol Use: No   OB History    Gravida Para Term Preterm AB TAB  SAB Ectopic Multiple Living   1 1  1      1      Review of Systems  All other systems reviewed and are negative.     Allergies  Desferal; Latex; Lisinopril; and Tape  Home Medications   Prior to Admission medications   Medication Sig Start Date End Date Taking? Authorizing Provider  folic acid (FOLVITE) 1 MG tablet Take 1 tablet (1 mg total) by mouth daily. 03/31/14  Yes [name], FNP  hydroxyurea (HYDREA) 500 MG capsule TAKE 1 CAPSULE ON ODD DAYS AND 2 CAPSULES ON EVEN DAYS 09/02/14  Yes [name], FNP  loratadine (CLARITIN) 10 MG tablet Take 10 mg by mouth daily.   Yes Historical Provider, MD  Oxycodone HCl 10 MG TABS Take 1 tablet (10 mg total) by mouth every 4 (four) hours as needed (moderate pain). 12/13/14  Yes [name], FNP  promethazine (PHENERGAN) 12.5 MG tablet Take 1 tablet (12.5 mg total) by mouth every 6 (six) hours as needed for nausea. 12/13/14  Yes [name], FNP  Vitamin D, Ergocalciferol, (DRISDOL) 50000 UNITS CAPS capsule Take 1 capsule (50,000 Units total) by mouth every 7 (seven) days. Takes on Sundays. 03/31/14  Yes [name], FNP  cephALEXin (KEFLEX) 500 MG capsule Take 1 capsule (500 mg total) by mouth 2 (  two) times daily. Patient not taking: Reported on 01/17/2015 12/17/14   Delice Bison Ward, DO  cetirizine (ZYRTEC) 10 MG tablet Take 1 tablet (10 mg total) by mouth daily. Patient not taking: Reported on 02/01/2015 12/16/14   [name], FNP   BP 105/56 mmHg  Pulse 102  Temp(Src) 99.2 F (37.3 C) (Oral)  Resp 17  Ht 5\' 3"  (1.6 m)  Wt 160 lb (72.576 kg)  BMI 28.35 kg/m2  SpO2 97% Physical Exam  Constitutional: She appears well-developed and well-nourished. She appears distressed.  HENT:  Head: Normocephalic and atraumatic.  Mouth/Throat: Oropharynx is clear and moist. No oropharyngeal exudate.  Eyes: Conjunctivae and EOM are normal. Pupils are equal, round, and reactive to light. Right eye exhibits no discharge. Left eye  exhibits no discharge. No scleral icterus.  Neck: Normal range of motion. Neck supple. No JVD present. No thyromegaly present.  Cardiovascular: Regular rhythm, normal heart sounds and intact distal pulses.  Exam reveals no gallop and no friction rub.   No murmur heard. Mild tachycardia to 105  Pulmonary/Chest: Effort normal and breath sounds normal. No respiratory distress. She has no wheezes. She has no rales. Tenderness:  non tender chest wall.  Abdominal: Soft. Bowel sounds are normal. She exhibits no distension and no mass. There is no tenderness.  Musculoskeletal: Normal range of motion. She exhibits no edema or tenderness.  Lymphadenopathy:    She has no cervical adenopathy.  Neurological: She is alert. Coordination normal.  Skin: Skin is warm and dry. No rash noted. No erythema.  Psychiatric: She has a normal mood and affect. Her behavior is normal.  Nursing note and vitals reviewed.   ED Course  Procedures (including critical care time) Labs Review Labs Reviewed  CBC WITH DIFFERENTIAL/PLATELET - Abnormal; Notable for the following:    RBC 2.24 (*)    Hemoglobin 8.7 (*)    HCT 24.1 (*)    MCV 107.6 (*)    MCH 38.8 (*)    MCHC 36.1 (*)    RDW 20.6 (*)    Neutrophils Relative % 86 (*)    Lymphocytes Relative 9 (*)    Lymphs Abs 0.6 (*)    All other components within normal limits  BASIC METABOLIC PANEL - Abnormal; Notable for the following:    Creatinine, Ser 0.39 (*)    Calcium 8.5 (*)    All other components within normal limits  RETICULOCYTES - Abnormal; Notable for the following:    Retic Ct Pct 22.5 (*)    RBC. 2.24 (*)    Retic Count, Manual 504.0 (*)    All other components within normal limits  HEPATIC FUNCTION PANEL - Abnormal; Notable for the following:    Albumin 3.2 (*)    AST 88 (*)    Alkaline Phosphatase 141 (*)    Total Bilirubin 10.6 (*)    Bilirubin, Direct 3.8 (*)    Indirect Bilirubin 6.8 (*)    All other components within normal limits   TROPONIN I    Imaging Review Dg Chest 2 View  02/01/2015   CLINICAL DATA:  34 year old female with left chest pain since last night. Initial encounter. Current history of sickle cell disease.  EXAM: CHEST  2 VIEW  COMPARISON:  12/17/2014 and earlier.  FINDINGS: Stable lung volumes. Normal cardiac size and mediastinal contours. Visualized tracheal air column is within normal limits. No pneumothorax, pulmonary edema, pleural effusion or confluent pulmonary opacity. Stable cholecystectomy clips. Stable bilateral humeral head sclerosis. Elsewhere bone  mineralization appears within normal limits. No acute osseous abnormality identified.  IMPRESSION: 1.  No acute cardiopulmonary abnormality. 2. 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   Electronically Signed   By: Genevie Ann M.D.   On: 02/01/2015 07:29     EKG Interpretation   Date/Time:  Tuesday Feb 01 2015 06:26:03 EDT Ventricular Rate:  94 PR Interval:  140 QRS Duration: 89 QT Interval:  380 QTC Calculation: 475 R Axis:   64 Text Interpretation:  Sinus rhythm Borderline prolonged QT interval No  significant change since last tracing 17 Dec 2014 Confirmed by KNAPP   MD-I, IVA ([id number]) on 02/01/2015 6:32:15 AM      MDM   Final diagnoses:  Sickle cell crisis  Chest pain, unspecified chest pain type     The pt has abnormal exam with tachycardia - tearfuly and ongoing significant pain - she has no fever but does have some hypoxia - CXR without acute infiltrates and ECG without ischemia but the pt is at risk for hypercoaguable states disorders, pain control, labs, ECG, CXR.  Minimal improvement with meds Xray and labs appropriate for retic up and hgb stable Trop neg  D/w Dr. Jerilee Hoh who will admit.  Noemi Chapel, MD 02/01/15 1030

## 2015-02-02 ENCOUNTER — Inpatient Hospital Stay (HOSPITAL_COMMUNITY): Payer: BLUE CROSS/BLUE SHIELD

## 2015-02-02 LAB — BASIC METABOLIC PANEL
Anion gap: 5 (ref 5–15)
BUN: 10 mg/dL (ref 6–20)
CALCIUM: 7.6 mg/dL — AB (ref 8.9–10.3)
CO2: 24 mmol/L (ref 22–32)
CREATININE: 0.43 mg/dL — AB (ref 0.44–1.00)
Chloride: 107 mmol/L (ref 101–111)
GFR calc Af Amer: >60 mL/min (ref >60)
GFR calc non Af Amer: >60 mL/min (ref >60)
GLUCOSE: 83 mg/dL (ref 70–99)
Potassium: 3.5 mmol/L (ref 3.5–5.1)
SODIUM: 136 mmol/L (ref 135–145)

## 2015-02-02 LAB — CBC WITH DIFFERENTIAL/PLATELET
Basophils Absolute: 0 10*3/uL (ref 0.0–0.1)
Basophils Relative: 0 % (ref 0–1)
EOS PCT: 1 % (ref 0–5)
Eosinophils Absolute: 0 10*3/uL (ref 0.0–0.7)
HEMATOCRIT: 21.7 % — AB (ref 36.0–46.0)
HEMOGLOBIN: 7.9 g/dL — AB (ref 12.0–15.0)
LYMPHS ABS: 1.2 10*3/uL (ref 0.7–4.0)
LYMPHS PCT: 17 % (ref 12–46)
MCH: 39.3 pg — ABNORMAL HIGH (ref 26.0–34.0)
MCHC: 36.4 g/dL — ABNORMAL HIGH (ref 30.0–36.0)
MCV: 108 fL — AB (ref 78.0–100.0)
MONO ABS: 0.6 10*3/uL (ref 0.1–1.0)
Monocytes Relative: 9 % (ref 3–12)
NEUTROS ABS: 5.1 10*3/uL (ref 1.7–7.7)
Neutrophils Relative %: 74 % (ref 43–77)
Platelets: 176 10*3/uL (ref 150–400)
RBC: 2.01 MIL/uL — AB (ref 3.87–5.11)
RDW: 18.8 % — AB (ref 11.5–15.5)
WBC: 6.9 10*3/uL (ref 4.0–10.5)

## 2015-02-02 LAB — TROPONIN I

## 2015-02-02 LAB — RETICULOCYTES
RBC.: 2.01 MIL/uL — ABNORMAL LOW (ref 3.87–5.11)
Retic Count, Absolute: 440.2 10*3/uL — ABNORMAL HIGH (ref 19.0–186.0)
Retic Ct Pct: 21.9 % — ABNORMAL HIGH (ref 0.4–3.1)

## 2015-02-02 LAB — URINE CULTURE
COLONY COUNT: NO GROWTH
Culture: NO GROWTH

## 2015-02-02 MED ORDER — ALPRAZOLAM 0.25 MG PO TABS
0.2500 mg | ORAL_TABLET | Freq: Three times a day (TID) | ORAL | Status: DC | PRN
  Administered 2015-02-02 – 2015-02-08 (×12): 0.25 mg via ORAL
  Filled 2015-02-02 (×12): qty 1

## 2015-02-02 NOTE — Care Management Note (Signed)
Case Management Note  Patient Details  Name: Dawn [name] MRN: [id number] Date of Birth: 1981/07/07   Expected Discharge Date:  02/04/15               Expected Discharge Plan:  Home/Self Care  In-House Referral:  NA  Discharge planning Services  CM Consult  Post Acute Care Choice:  NA Choice offered to:  NA  DME Arranged:    DME Agency:     HH Arranged:    [place] Agency:     Status of Service:  Completed, signed off  Medicare Important Message Given:    Date Medicare IM Given:    Medicare IM give by:    Date Additional Medicare IM Given:    Additional Medicare Important Message give by:     If discussed at [place] of Stay Meetings, dates discussed:    Additional Comments: Pt is from home, lives with children. Pt is employed and independent at baseline. Pt has home O2 for PRN use through [facility]. Pt has no other DME's or [place] services. Pt plans to DC home with self care. No CM needs.   Sherald Barge, RN 02/02/2015, 10:50 AM

## 2015-02-02 NOTE — Progress Notes (Signed)
[facility] PROGRESS NOTE  Dawn [name] RUE:[id number] DOB: 03-Sep-1981 DOA: 02/01/2015 PCP: [name],[name], MD  Assessment/Plan: Sickle cell crisis: pain controlled. continue IV fluids at 125/hr and full Dose Dilaudid PCA, Toradol. Oxygen saturation level 100% on 1L. Hemoglobin 7.9. retic count elevated but stable Active Problems: Chest pain; likely related to #1. Initial troponin negative x3. EKG without acute changes. Repeat chest x-ray no acute cardiopulmonary abnormality. Management as above. continue oxygen supplementation as indicated. ED note refered to hypoxia however patient's oxygen saturation levels been greater than 92% on room air. She has no leukocytosis but did spike temp yesterday.  max temp 99.7 since then. Respiratory effort normal. monitor   Sickle cell disease/anemia: Patient with elevated bilirubin, retic level  21.9.  Will monitor for transfusion needs. 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   Elevated LFTs: Related to #1. Monitor closely   Anxiety state: worsening anxiety. Will provide anti-anxiety agent low dose prn   Code Status: full Family Communication: none present Disposition Plan: home when ready   Consultants:  none  Procedures:  none  Antibiotics:  none  HPI/Subjective: Awake eating chicken and french fries reports increasing anxiety with "makes my chest hurt worse"  Objective: Filed Vitals:   02/02/15 1131  BP:   Pulse:   Temp:   Resp: 13    Intake/Output Summary (Last 24 hours) at 02/02/15 1218 Last data filed at 02/02/15 1217  Gross per 24 hour  Intake 3214.59 ml  Output    350 ml  Net 2864.59 ml   Filed Weights   02/01/15 0626 02/01/15 1608  Weight: 72.576 kg (160 lb) 70.761 kg (156 lb)    Exam:   General:  Well nourished appears comfortable  Cardiovascular: RRR no MGR no LE edema  Respiratory: normal effort but slightly shallow  BS clear no wheeze  Abdomen: non-distended +BS non-tender  Musculoskeletal: no clubbing or cyanosis   Data Reviewed: Basic Metabolic Panel:  Recent Labs Lab 02/01/15 0721 02/02/15 0445  NA 136 136  K 3.8 3.5  CL 105 107  CO2 24 24  GLUCOSE 92 83  BUN 8 10  CREATININE 0.39* 0.43*  CALCIUM 8.5* 7.6*  MG 1.6*  --    Liver Function Tests:  Recent Labs Lab 02/01/15 0721  AST 88*  ALT 35  ALKPHOS 141*  BILITOT 10.6*  PROT 7.8  ALBUMIN 3.2*   No results for input(s): LIPASE, AMYLASE in the last 168 hours. No results for input(s): AMMONIA in the last 168 hours. CBC:  Recent Labs Lab 02/01/15 0721 02/02/15 0445  WBC 7.2 6.9  NEUTROABS 6.2 5.1  HGB 8.7* 7.9*  HCT 24.1* 21.7*  MCV 107.6* 108.0*  PLT 210 176   Cardiac Enzymes:  Recent Labs Lab 02/01/15 0721 02/01/15 1555 02/02/15 0445  TROPONINI <0.03 <0.03 <0.03   BNP (last 3 results) No results for input(s): BNP in the last 8760 hours.  ProBNP (last 3 results) No results for input(s): PROBNP in the last 8760 hours.  CBG: No results for input(s): GLUCAP in the last 168 hours.  Recent Results (from the past 240 hour(s))  Culture, blood (routine x 2)     Status: None (Preliminary result)   Collection Time: 02/01/15  3:55 PM  Result Value Ref Range Status   Specimen Description BLOOD LEFT WRIST  Final   Special Requests BOTTLES DRAWN AEROBIC AND ANAEROBIC 10CC  Final   Culture NO GROWTH 1  DAY  Final   Report Status PENDING  Incomplete  Culture, blood (routine x 2)     Status: None (Preliminary result)   Collection Time: 02/01/15  4:00 PM  Result Value Ref Range Status   Specimen Description BLOOD LEFT HAND  Final   Special Requests BOTTLES DRAWN AEROBIC AND ANAEROBIC 6CC  Final   Culture NO GROWTH 1 DAY  Final   Report Status PENDING  Incomplete     Studies: Dg Chest 2 View  02/02/2015   CLINICAL DATA:  DYSPNEA AND LEFT SIDE CHEST PAIN X 1 DAY, SICKLE CELL, NON SMOKER  EXAM: CHEST  2 VIEW   COMPARISON:  02/01/2015  FINDINGS: Cardiac silhouette is borderline enlarged. Normal mediastinal and hilar contours.  Lungs are clear.  No pleural effusion or pneumothorax.  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 This is stable.  IMPRESSION: No acute cardiopulmonary disease.   Electronically Signed   By: Lajean Manes M.D.   On: 02/02/2015 08:46   Dg Chest 2 View  02/01/2015   CLINICAL DATA:  34 year old female with left chest pain since last night. Initial encounter. Current history of sickle cell disease.  EXAM: CHEST  2 VIEW  COMPARISON:  12/17/2014 and earlier.  FINDINGS: Stable lung volumes. Normal cardiac size and mediastinal contours. Visualized tracheal air column is within normal limits. No pneumothorax, pulmonary edema, pleural effusion or confluent pulmonary opacity. Stable cholecystectomy clips. Stable bilateral humeral head sclerosis. Elsewhere bone mineralization appears within normal limits. No acute osseous abnormality identified.  IMPRESSION: 1.  No acute cardiopulmonary abnormality. 2. 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   Electronically Signed   By: Genevie Ann M.D.   On: 02/01/2015 07:29    Scheduled Meds: . enoxaparin (LOVENOX) injection  40 mg Subcutaneous Q24H  . folic acid  1 mg Oral Daily  . HYDROmorphone PCA 0.3 mg/mL   Intravenous Once  . HYDROmorphone PCA 0.3 mg/mL   Intravenous 6 times per day  . hydroxyurea  1,000 mg Oral Daily  . ketorolac  15 mg Intravenous 4 times per day  . loratadine  10 mg Oral Daily  . pantoprazole  40 mg Oral Daily   Or  . pantoprazole (PROTONIX) IV  40 mg Intravenous Daily  . senna-docusate  1 tablet Oral BID  . [START ON 02/06/2015] Vitamin D (Ergocalciferol)  50,000 Units Oral Q7 days   Continuous Infusions: . sodium chloride 125 mL/hr at 02/02/15 3086    Principal Problem:   Sickle cell crisis Active Problems:   Sickle cell  disease   Elevated LFTs   Chest pain   Anxiety state    Time spent: 30 minutes    [facility] [facility] Pager [phone]. If 7PM-7AM, please contact night-coverage at [email or web address], password [facility] 02/02/2015, 12:18 PM  LOS: 1 day

## 2015-02-03 LAB — COMPREHENSIVE METABOLIC PANEL
ALBUMIN: 2.5 g/dL — AB (ref 3.5–5.0)
ALT: 33 U/L (ref 14–54)
AST: 96 U/L — ABNORMAL HIGH (ref 15–41)
Alkaline Phosphatase: 104 U/L (ref 38–126)
Anion gap: 5 (ref 5–15)
BUN: 10 mg/dL (ref 6–20)
CO2: 24 mmol/L (ref 22–32)
Calcium: 7.5 mg/dL — ABNORMAL LOW (ref 8.9–10.3)
Chloride: 109 mmol/L (ref 101–111)
Creatinine, Ser: 0.47 mg/dL (ref 0.44–1.00)
GFR calc Af Amer: >60 mL/min (ref >60)
GFR calc non Af Amer: >60 mL/min (ref >60)
Glucose, Bld: 106 mg/dL — ABNORMAL HIGH (ref 65–99)
POTASSIUM: 3 mmol/L — AB (ref 3.5–5.1)
Sodium: 138 mmol/L (ref 135–145)
TOTAL PROTEIN: 6.2 g/dL — AB (ref 6.5–8.1)
Total Bilirubin: 10.2 mg/dL — ABNORMAL HIGH (ref 0.3–1.2)

## 2015-02-03 LAB — CBC
HCT: 17.8 % — ABNORMAL LOW (ref 36.0–46.0)
Hemoglobin: 6.7 g/dL — CL (ref 12.0–15.0)
MCH: 37.6 pg — ABNORMAL HIGH (ref 26.0–34.0)
MCHC: 37.8 g/dL — ABNORMAL HIGH (ref 30.0–36.0)
MCV: 106.6 fL — AB (ref 78.0–100.0)
PLATELETS: 150 10*3/uL (ref 150–400)
RBC: 1.67 MIL/uL — ABNORMAL LOW (ref 3.87–5.11)
RDW: 17.7 % — AB (ref 11.5–15.5)
WBC: 7 10*3/uL (ref 4.0–10.5)

## 2015-02-03 LAB — RETICULOCYTES
RBC.: 1.67 MIL/uL — ABNORMAL LOW (ref 3.87–5.11)
Retic Count, Absolute: 324 10*3/uL — ABNORMAL HIGH (ref 19.0–186.0)
Retic Ct Pct: 19.4 % — ABNORMAL HIGH (ref 0.4–3.1)

## 2015-02-03 MED ORDER — CETYLPYRIDINIUM CHLORIDE 0.05 % MT LIQD
7.0000 mL | Freq: Two times a day (BID) | OROMUCOSAL | Status: DC
  Administered 2015-02-03 – 2015-02-09 (×12): 7 mL via OROMUCOSAL

## 2015-02-03 MED ORDER — POTASSIUM CHLORIDE CRYS ER 20 MEQ PO TBCR
40.0000 meq | EXTENDED_RELEASE_TABLET | ORAL | Status: AC
  Administered 2015-02-03 (×2): 40 meq via ORAL
  Filled 2015-02-03 (×2): qty 2

## 2015-02-03 NOTE — Progress Notes (Signed)
PCA syringe changed. Wasted 47mL of Hydromorphone with Cecilie Kicks, RN as witness

## 2015-02-03 NOTE — Progress Notes (Signed)
[facility] PROGRESS NOTE  Dawn [name] YTK:[id number] DOB: 1981/01/24 DOA: 02/01/2015 PCP: [name],[name], MD  Assessment/Plan: Sickle cell crisis: pain fairly well controlled and some symptomatic improvement. continue IV fluids at 125/hr and full Dose Dilaudid PCA, Toradol. Oxygen saturation level 98% on 1L. Hemoglobin 6.7 HCT 17.8. retic count elevated but trending downward. Will hold off on transfusion for now.  Active Problems: Chest pain; likely related to #1. Slight improvement. Troponin negative x3. EKG without acute changes. Repeat chest x-ray no acute cardiopulmonary abnormality. Management as above. continue oxygen supplementation as indicated. ED note refered to hypoxia however patient's oxygen saturation levels been greater than 92% on room air. She has no leukocytosis. max temp 102.4 02/02/15 evening. Blood culture no growth to date.  Respiratory effort normal. monitor   Sickle cell disease/anemia: Patient with elevated bilirubin, retic level 21.9. Will monitor for transfusion needs. 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   Elevated LFTs: Related to #1. Monitor closely   Anxiety state: improved. continue anti-anxiety agent low dose prn   Code Status: full Family Communication: family at bedside Disposition Plan: home when ready   Consultants:  none  Procedures:  none  Antibiotics:  none  HPI/Subjective: Sitting on side of bed visiting with family. Reports minimal improvement in symptoms  Objective: Filed Vitals:   02/03/15 1032  BP:   Pulse:   Temp:   Resp: 19    Intake/Output Summary (Last 24 hours) at 02/03/15 1236 Last data filed at 02/03/15 0618  Gross per 24 hour  Intake 3105.42 ml  Output      0 ml  Net 3105.42 ml   Filed Weights   02/01/15 0626 02/01/15 1608  Weight: 72.576 kg (160 lb) 70.761 kg (156 lb)    Exam:   General:  Appears  comfortable  Cardiovascular: S1 and S2 no m/g/r   Respiratory: normal effort BS with good air movement  Abdomen: non-distended with +BS non-tender  Musculoskeletal: joints without swelling or erythema   Data Reviewed: Basic Metabolic Panel:  Recent Labs Lab 02/01/15 0721 02/02/15 0445 02/03/15 0653  NA 136 136 138  K 3.8 3.5 3.0*  CL 105 107 109  CO2 24 24 24   GLUCOSE 92 83 106*  BUN 8 10 10   CREATININE 0.39* 0.43* 0.47  CALCIUM 8.5* 7.6* 7.5*  MG 1.6*  --   --    Liver Function Tests:  Recent Labs Lab 02/01/15 0721 02/03/15 0653  AST 88* 96*  ALT 35 33  ALKPHOS 141* 104  BILITOT 10.6* 10.2*  PROT 7.8 6.2*  ALBUMIN 3.2* 2.5*   No results for input(s): LIPASE, AMYLASE in the last 168 hours. No results for input(s): AMMONIA in the last 168 hours. CBC:  Recent Labs Lab 02/01/15 0721 02/02/15 0445 02/03/15 0653  WBC 7.2 6.9 7.0  NEUTROABS 6.2 5.1  --   HGB 8.7* 7.9* 6.7*  HCT 24.1* 21.7* 17.8*  MCV 107.6* 108.0* 106.6*  PLT 210 176 150   Cardiac Enzymes:  Recent Labs Lab 02/01/15 0721 02/01/15 1555 02/02/15 0445  TROPONINI <0.03 <0.03 <0.03   BNP (last 3 results) No results for input(s): BNP in the last 8760 hours.  ProBNP (last 3 results) No results for input(s): PROBNP in the last 8760 hours.  CBG: No results for input(s): GLUCAP in the last 168 hours.  Recent Results (from the past 240 hour(s))  Culture, Urine     Status: None  Collection Time: 02/01/15  3:22 PM  Result Value Ref Range Status   Specimen Description URINE, CLEAN CATCH  Final   Special Requests NONE  Final   [place] Count NO GROWTH Performed at [facility]   Final   Culture NO GROWTH Performed at [facility]   Final   Report Status 02/02/2015 FINAL  Final  Culture, blood (routine x 2)     Status: None (Preliminary result)   Collection Time: 02/01/15  3:55 PM  Result Value Ref Range Status   Specimen Description BLOOD LEFT WRIST  Final   Special  Requests BOTTLES DRAWN AEROBIC AND ANAEROBIC 10CC  Final   Culture NO GROWTH 2 DAYS  Final   Report Status PENDING  Incomplete  Culture, blood (routine x 2)     Status: None (Preliminary result)   Collection Time: 02/01/15  4:00 PM  Result Value Ref Range Status   Specimen Description BLOOD LEFT HAND  Final   Special Requests BOTTLES DRAWN AEROBIC AND ANAEROBIC 6CC  Final   Culture NO GROWTH 2 DAYS  Final   Report Status PENDING  Incomplete     Studies: Dg Chest 2 View  02/02/2015   CLINICAL DATA:  DYSPNEA AND LEFT SIDE CHEST PAIN X 1 DAY, SICKLE CELL, NON SMOKER  EXAM: CHEST  2 VIEW  COMPARISON:  02/01/2015  FINDINGS: Cardiac silhouette is borderline enlarged. Normal mediastinal and hilar contours.  Lungs are clear.  No pleural effusion or pneumothorax.  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 This is stable.  IMPRESSION: No acute cardiopulmonary disease.   Electronically Signed   By: Lajean Manes M.D.   On: 02/02/2015 08:46    Scheduled Meds: . enoxaparin (LOVENOX) injection  40 mg Subcutaneous Q24H  . folic acid  1 mg Oral Daily  . HYDROmorphone PCA 0.3 mg/mL   Intravenous Once  . HYDROmorphone PCA 0.3 mg/mL   Intravenous 6 times per day  . hydroxyurea  1,000 mg Oral Daily  . ketorolac  15 mg Intravenous 4 times per day  . loratadine  10 mg Oral Daily  . pantoprazole  40 mg Oral Daily   Or  . pantoprazole (PROTONIX) IV  40 mg Intravenous Daily  . potassium chloride  40 mEq Oral Q4H  . senna-docusate  1 tablet Oral BID  . [START ON 02/06/2015] Vitamin D (Ergocalciferol)  50,000 Units Oral Q7 days   Continuous Infusions: . sodium chloride 125 mL/hr at 02/03/15 0532    Principal Problem:   Sickle cell crisis Active Problems:   Sickle cell disease   Elevated LFTs   Chest pain   Anxiety state    Time spent: 20 minutes    [place] [facility] Pager [phone]. If 7PM-7AM, please contact  night-coverage at [email or web address], password [facility] 02/03/2015, 12:36 PM  LOS: 2 days

## 2015-02-03 NOTE — Progress Notes (Signed)
CRITICAL VALUE ALERT  Critical value received:  Hemoglobin 6.7  Date of notification:  02/03/15  Time of notification: 0830  Critical value read back: Yes  Nurse who received alert:  Joelyn Oms  MD notified (1st page):  Dr. Truman Hayward  Time of first page: 0845-Verbal notification

## 2015-02-04 DIAGNOSIS — R079 Chest pain, unspecified: Secondary | ICD-10-CM

## 2015-02-04 DIAGNOSIS — R7989 Other specified abnormal findings of blood chemistry: Secondary | ICD-10-CM

## 2015-02-04 LAB — COMPREHENSIVE METABOLIC PANEL
ALT: 31 U/L (ref 14–54)
AST: 79 U/L — ABNORMAL HIGH (ref 15–41)
Albumin: 2.3 g/dL — ABNORMAL LOW (ref 3.5–5.0)
Alkaline Phosphatase: 99 U/L (ref 38–126)
Anion gap: 5 (ref 5–15)
BUN: 8 mg/dL (ref 6–20)
CALCIUM: 7.6 mg/dL — AB (ref 8.9–10.3)
CHLORIDE: 109 mmol/L (ref 101–111)
CO2: 24 mmol/L (ref 22–32)
CREATININE: 0.36 mg/dL — AB (ref 0.44–1.00)
GFR calc Af Amer: >60 mL/min (ref >60)
GFR calc non Af Amer: >60 mL/min (ref >60)
Glucose, Bld: 96 mg/dL (ref 65–99)
Potassium: 3.6 mmol/L (ref 3.5–5.1)
Sodium: 138 mmol/L (ref 135–145)
Total Bilirubin: 7.2 mg/dL — ABNORMAL HIGH (ref 0.3–1.2)
Total Protein: 5.9 g/dL — ABNORMAL LOW (ref 6.5–8.1)

## 2015-02-04 LAB — CBC
HEMATOCRIT: 14.9 % — AB (ref 36.0–46.0)
Hemoglobin: 5.6 g/dL — CL (ref 12.0–15.0)
MCH: 39.2 pg — ABNORMAL HIGH (ref 26.0–34.0)
MCHC: 37.6 g/dL — ABNORMAL HIGH (ref 30.0–36.0)
MCV: 104.2 fL — ABNORMAL HIGH (ref 78.0–100.0)
PLATELETS: 124 10*3/uL — AB (ref 150–400)
RBC: 1.43 MIL/uL — ABNORMAL LOW (ref 3.87–5.11)
RDW: 18.1 % — ABNORMAL HIGH (ref 11.5–15.5)
WBC: 5.6 10*3/uL (ref 4.0–10.5)

## 2015-02-04 LAB — RETICULOCYTES
RBC.: 1.43 MIL/uL — AB (ref 3.87–5.11)
Retic Count, Absolute: 238.8 10*3/uL — ABNORMAL HIGH (ref 19.0–186.0)
Retic Ct Pct: 16.7 % — ABNORMAL HIGH (ref 0.4–3.1)

## 2015-02-04 LAB — PREPARE RBC (CROSSMATCH)

## 2015-02-04 MED ORDER — SODIUM CHLORIDE 0.9 % IV SOLN: Freq: Once | INTRAVENOUS | Status: DC

## 2015-02-04 NOTE — Care Management Note (Signed)
Case Management Note  Patient Details  Name: Dawn [name] MRN: [id number] Date of Birth: 03/21/1981  Subjective/Objective:                    Action/Plan:   Expected Discharge Date:  02/04/15               Expected Discharge Plan:  Home/Self Care  In-House Referral:  NA  Discharge planning Services  CM Consult  Post Acute Care Choice:  NA Choice offered to:  NA  DME Arranged:    DME Agency:     HH Arranged:    HH Agency:     Status of Service:  Completed, signed off  Medicare Important Message Given:  Yes Date Medicare IM Given:  02/04/15 Medicare IM give by:  Christinia Gully, RN BSN CM Date Additional Medicare IM Given:    Additional Medicare Important Message give by:     If discussed at [place] of Stay Meetings, dates discussed:    Additional Comments: Pt still requiring PCA for pain control. No CM needs noted. Christinia Gully [place], RN 02/04/2015, 11:49 AM

## 2015-02-04 NOTE — Progress Notes (Signed)
Blood not available at this time,Dr [name] notified, that it maybe late tonight or tomorrow when the blood is ready. Will continue to monitor patient.

## 2015-02-04 NOTE — Progress Notes (Signed)
Patient's H/H was 5.6,and 14.9,Dr Marin Comment notified. Will continue to monitor patient.

## 2015-02-04 NOTE — Progress Notes (Signed)
[facility] PROGRESS NOTE  LYNZE REDDY GQQ:[id number] DOB: 1981-05-03    PCP:   [name],[name], MD   HPI: Dawn [name] is an 34 y.o. female admitted for sickle cell crisis.  She has PCA pump and has adequate pain control.  She also has improved.  She dropped her Hb down to 5.6 g per dL today.    Rewiew of Systems:  Constitutional: Negative for malaise, fever and chills. No significant weight loss or weight gain Eyes: Negative for eye pain, redness and discharge, diplopia, visual changes, or flashes of light. ENMT: Negative for ear pain, hoarseness, nasal congestion, sinus pressure and sore throat. No headaches; tinnitus, drooling, or problem swallowing. Cardiovascular: Negative for chest pain, palpitations, diaphoresis, dyspnea and peripheral edema. ; No orthopnea, PND Respiratory: Negative for cough, hemoptysis, wheezing and stridor. No pleuritic chestpain. Gastrointestinal: Negative for nausea, vomiting, diarrhea, constipation, abdominal pain, melena, blood in stool, hematemesis, jaundice and rectal bleeding.    Genitourinary: Negative for frequency, dysuria, incontinence,flank pain and hematuria; Musculoskeletal: Negative for back pain and neck pain. Negative for swelling and trauma.;  Skin: . Negative for pruritus, rash, abrasions, bruising and skin lesion.; ulcerations Neuro: Negative for headache, lightheadedness and neck stiffness. Negative for weakness, altered level of consciousness , altered mental status, extremity weakness, burning feet, involuntary movement, seizure and syncope.  Psych: negative for anxiety, depression, insomnia, tearfulness, panic attacks, hallucinations, paranoia, suicidal or homicidal ideation    Past Medical History  Diagnosis Date  . Sickle cell disease   . Sickle cell disease, type S   . Blood transfusion   . 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 HCAP (healthcare-associated pneumonia) 05/19/2013  . Sickle cell anemia     Past  Surgical History  Procedure Laterality Date  . Cholecystectomy    .  left knee acl reconstruction    . Cesarean section      x 2  . Portacath placement    . Portacath placement Left 02/23/2013    Procedure: INSERTION PORT-A-CATH;  Surgeon: Donato Heinz, MD;  Location: [facility];  Service: General;  Laterality: Left;  . Port-a-cath removal Left 07/29/2013    Procedure: REMOVAL PORT-A-CATH;  Surgeon: Scherry Ran, MD;  Location: [facility];  Service: General;  Laterality: Left;  . Tee without cardioversion N/A 08/07/2013    Procedure: TRANSESOPHAGEAL ECHOCARDIOGRAM (TEE);  Surgeon: Arnoldo Lenis, MD;  Location: [facility];  Service: Cardiology;  Laterality: N/A;    Medications:  HOME MEDS: Prior to Admission medications   Medication Sig Start Date End Date Taking? Authorizing Provider  folic acid (FOLVITE) 1 MG tablet Take 1 tablet (1 mg total) by mouth daily. 03/31/14  Yes [name], FNP  hydroxyurea (HYDREA) 500 MG capsule TAKE 1 CAPSULE ON ODD DAYS AND 2 CAPSULES ON EVEN DAYS 09/02/14  Yes [name], FNP  loratadine (CLARITIN) 10 MG tablet Take 10 mg by mouth daily.   Yes Historical Provider, MD  Oxycodone HCl 10 MG TABS Take 1 tablet (10 mg total) by mouth every 4 (four) hours as needed (moderate pain). 12/13/14  Yes [name], FNP  promethazine (PHENERGAN) 12.5 MG tablet Take 1 tablet (12.5 mg total) by mouth every 6 (six) hours as needed for nausea. 12/13/14  Yes [name], FNP  Vitamin D, Ergocalciferol, (DRISDOL) 50000 UNITS CAPS capsule Take 1 capsule (50,000 Units total) by mouth every 7 (seven) days. Takes on Sundays. 03/31/14  Yes [name], FNP  Allergies:  Allergies  Allergen Reactions  . Desferal [Deferoxamine] Hives    Local reaction on arm only during infusion. Can take with benadryl   . Latex Other (See Comments)    REACTION: Pt experiences a burning sensation on contacted skin areas  . Lisinopril Other (See Comments) and Cough     REACTION: Sore/scratchy throat  . Tape Other (See Comments)    REACTION: Pt. Experiences a burning sensation on contacted skin areas    Social History:   reports that she has never smoked. She has quit using smokeless tobacco. She reports that she does not drink alcohol or use illicit drugs.  Family History: Family History  Problem Relation Age of Onset  . Sickle cell trait Mother   . Sickle cell trait Father   . Hypertension Father   . Diabetes Father   . Sickle cell trait Sister   . Cancer Paternal Aunt     Breast     Physical Exam: Filed Vitals:   02/03/15 2331 02/04/15 0331 02/04/15 0400 02/04/15 0531  BP:    113/61  Pulse:    96  Temp:    98.4 F (36.9 C)  TempSrc:    Oral  Resp: 17 16 16 16   Height:      Weight:      SpO2: 100% 100% 100% 97%   Blood pressure 113/61, pulse 96, temperature 98.4 F (36.9 C), temperature source Oral, resp. rate 16, height 5\' 3"  (1.6 m), weight 70.761 kg (156 lb), SpO2 97 %.  GEN:  Pleasant  patient lying in the stretcher in no acute distress; cooperative with exam. PSYCH:  alert and oriented x4; does not appear anxious or depressed; affect is appropriate. HEENT: Mucous membranes pink and anicteric; PERRLA; EOM intact; no cervical lymphadenopathy nor thyromegaly or carotid bruit; no JVD; There were no stridor. Neck is very supple. Breasts:: Not examined CHEST WALL: No tenderness CHEST: Normal respiration, clear to auscultation bilaterally.  HEART: Regular rate and rhythm.  There are no murmur, rub, or gallops.   BACK: No kyphosis or scoliosis; no CVA tenderness ABDOMEN: soft and non-tender; no masses, no organomegaly, normal abdominal bowel sounds; no pannus; no intertriginous candida. There is no rebound and no distention. Rectal Exam: Not done EXTREMITIES: No bone or joint deformity; age-appropriate arthropathy of the hands and knees; no edema; no ulcerations.  There is no calf tenderness. Genitalia: not examined PULSES: 2+ and  symmetric SKIN: Normal hydration no rash or ulceration CNS: Cranial nerves 2-12 grossly intact no focal lateralizing neurologic deficit.  Speech is fluent; uvula elevated with phonation, facial symmetry and tongue midline. DTR are normal bilaterally, cerebella exam is intact, barbinski is negative and strengths are equaled bilaterally.  No sensory loss.   Labs on Admission:  Basic Metabolic Panel:  Recent Labs Lab 02/01/15 0721 02/02/15 0445 02/03/15 0653 02/04/15 0709  NA 136 136 138 138  K 3.8 3.5 3.0* 3.6  CL 105 107 109 109  CO2 24 24 24 24   GLUCOSE 92 83 106* 96  BUN 8 10 10 8   CREATININE 0.39* 0.43* 0.47 0.36*  CALCIUM 8.5* 7.6* 7.5* 7.6*  MG 1.6*  --   --   --    Liver Function Tests:  Recent Labs Lab 02/01/15 0721 02/03/15 0653 02/04/15 0709  AST 88* 96* 79*  ALT 35 33 31  ALKPHOS 141* 104 99  BILITOT 10.6* 10.2* 7.2*  PROT 7.8 6.2* 5.9*  ALBUMIN 3.2* 2.5* 2.3*   CBC:  Recent  Labs Lab 02/01/15 0721 02/02/15 0445 02/03/15 0653 02/04/15 0709  WBC 7.2 6.9 7.0 5.6  NEUTROABS 6.2 5.1  --   --   HGB 8.7* 7.9* 6.7* 5.6*  HCT 24.1* 21.7* 17.8* 14.9*  MCV 107.6* 108.0* 106.6* 104.2*  PLT 210 176 150 124*   Cardiac Enzymes:  Recent Labs Lab 02/01/15 0721 02/01/15 1555 02/02/15 0445  TROPONINI <0.03 <0.03 <0.03   Assessment/Plan Present on Admission:  . Sickle cell crisis . Sickle cell disease . Elevated LFTs . Chest pain . Anxiety state . Macrocytosis  PLAN:  Continue with IV pain med and IVF.  Will transfuse 2 units of PRBCs, and continue with her current treatment.   Other plans as per orders.  Code Status: FULL Haskel Khan, MD. [facility] Pager [phone] 7pm to 7am.  02/04/2015, 10:15 AM

## 2015-02-05 LAB — CBC
HCT: 18 % — ABNORMAL LOW (ref 36.0–46.0)
Hemoglobin: 6.7 g/dL — CL (ref 12.0–15.0)
MCH: 37.4 pg — AB (ref 26.0–34.0)
MCHC: >37 g/dL — ABNORMAL HIGH (ref 30.0–36.0)
MCV: 101.1 fL — ABNORMAL HIGH (ref 78.0–100.0)
Platelets: 157 10*3/uL (ref 150–400)
RBC: 1.78 MIL/uL — ABNORMAL LOW (ref 3.87–5.11)
RDW: 19.5 % — ABNORMAL HIGH (ref 11.5–15.5)
WBC: 6.8 10*3/uL (ref 4.0–10.5)

## 2015-02-05 LAB — HEMOGLOBIN AND HEMATOCRIT, BLOOD
HEMATOCRIT: 22.2 % — AB (ref 36.0–46.0)
Hemoglobin: 8.3 g/dL — ABNORMAL LOW (ref 12.0–15.0)

## 2015-02-05 MED ORDER — OXYCODONE HCL 5 MG PO TABS
5.0000 mg | ORAL_TABLET | ORAL | Status: DC | PRN
  Administered 2015-02-05 – 2015-02-08 (×8): 5 mg via ORAL
  Filled 2015-02-05 (×8): qty 1

## 2015-02-05 MED ORDER — DIPHENHYDRAMINE HCL 50 MG/ML IJ SOLN
25.0000 mg | Freq: Once | INTRAMUSCULAR | Status: AC
  Administered 2015-02-05: 25 mg via INTRAVENOUS
  Filled 2015-02-05: qty 1

## 2015-02-05 NOTE — Progress Notes (Signed)
[facility] PROGRESS NOTE  QUANIYA DAMAS IEP:[id number] DOB: 1981/04/06    PCP:   [name],[name], MD   HPI: Dawn [name] is an 34 y.o. female admitted for sickle cell crisis on PCA pump.  Her HB dropped to 5.6 g per dL, and she is getting 2 units of PRBCs.   No chest pain.   Rewiew of Systems:  Constitutional: Negative for malaise, fever and chills. No significant weight loss or weight gain Eyes: Negative for eye pain, redness and discharge, diplopia, visual changes, or flashes of light. ENMT: Negative for ear pain, hoarseness, nasal congestion, sinus pressure and sore throat. No headaches; tinnitus, drooling, or problem swallowing. Cardiovascular: Negative for chest pain, palpitations, diaphoresis, dyspnea and peripheral edema. ; No orthopnea, PND Respiratory: Negative for cough, hemoptysis, wheezing and stridor. No pleuritic chestpain. Gastrointestinal: Negative for nausea, vomiting, diarrhea, constipation, abdominal pain, melena, blood in stool, hematemesis, jaundice and rectal bleeding.    Genitourinary: Negative for frequency, dysuria, incontinence,flank pain and hematuria; Musculoskeletal: Negative for back pain and neck pain. Negative for swelling and trauma.;  Skin: . Negative for pruritus, rash, abrasions, bruising and skin lesion.; ulcerations Neuro: Negative for headache, lightheadedness and neck stiffness. Negative for weakness, altered level of consciousness , altered mental status, extremity weakness, burning feet, involuntary movement, seizure and syncope.  Psych: negative for anxiety, depression, insomnia, tearfulness, panic attacks, hallucinations, paranoia, suicidal or homicidal ideation    Past Medical History  Diagnosis Date  . Sickle cell disease   . Sickle cell disease, type S   . Blood transfusion   . 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 HCAP (healthcare-associated pneumonia) 05/19/2013  . Sickle cell anemia     Past Surgical History  Procedure  Laterality Date  . Cholecystectomy    .  left knee acl reconstruction    . Cesarean section      x 2  . Portacath placement    . Portacath placement Left 02/23/2013    Procedure: INSERTION PORT-A-CATH;  Surgeon: Donato Heinz, MD;  Location: [facility];  Service: General;  Laterality: Left;  . Port-a-cath removal Left 07/29/2013    Procedure: REMOVAL PORT-A-CATH;  Surgeon: Scherry Ran, MD;  Location: [facility];  Service: General;  Laterality: Left;  . Tee without cardioversion N/A 08/07/2013    Procedure: TRANSESOPHAGEAL ECHOCARDIOGRAM (TEE);  Surgeon: Arnoldo Lenis, MD;  Location: [facility];  Service: Cardiology;  Laterality: N/A;    Medications:  HOME MEDS: Prior to Admission medications   Medication Sig Start Date End Date Taking? Authorizing Provider  folic acid (FOLVITE) 1 MG tablet Take 1 tablet (1 mg total) by mouth daily. 03/31/14  Yes [name], FNP  hydroxyurea (HYDREA) 500 MG capsule TAKE 1 CAPSULE ON ODD DAYS AND 2 CAPSULES ON EVEN DAYS 09/02/14  Yes [name], FNP  loratadine (CLARITIN) 10 MG tablet Take 10 mg by mouth daily.   Yes Historical Provider, MD  Oxycodone HCl 10 MG TABS Take 1 tablet (10 mg total) by mouth every 4 (four) hours as needed (moderate pain). 12/13/14  Yes [name], FNP  promethazine (PHENERGAN) 12.5 MG tablet Take 1 tablet (12.5 mg total) by mouth every 6 (six) hours as needed for nausea. 12/13/14  Yes [name], FNP  Vitamin D, Ergocalciferol, (DRISDOL) 50000 UNITS CAPS capsule Take 1 capsule (50,000 Units total) by mouth every 7 (seven) days. Takes on Sundays. 03/31/14  Yes [name], FNP     Allergies:  Allergies  Allergen Reactions  . Desferal [Deferoxamine] Hives    Local reaction on arm only during infusion. Can take with benadryl   . Latex Other (See Comments)    REACTION: Pt experiences a burning sensation on contacted skin areas  . Lisinopril Other (See Comments) and Cough    REACTION: Sore/scratchy  throat  . Tape Other (See Comments)    REACTION: Pt. Experiences a burning sensation on contacted skin areas    Social History:   reports that she has never smoked. She has quit using smokeless tobacco. She reports that she does not drink alcohol or use illicit drugs.  Family History: Family History  Problem Relation Age of Onset  . Sickle cell trait Mother   . Sickle cell trait Father   . Hypertension Father   . Diabetes Father   . Sickle cell trait Sister   . Cancer Paternal Aunt     Breast     Physical Exam: Filed Vitals:   02/05/15 0715 02/05/15 1011 02/05/15 1215 02/05/15 1231  BP: 135/76 107/70 125/69 117/78  Pulse: 106 98 94 92  Temp: 100.3 F (37.9 C) 98 F (36.7 C) 98.5 F (36.9 C) 97.7 F (36.5 C)  TempSrc: Oral Oral Oral Oral  Resp: 15 27 18 20   Height:      Weight:      SpO2: 98% 100% 98% 97%   Blood pressure 117/78, pulse 92, temperature 97.7 F (36.5 C), temperature source Oral, resp. rate 20, height 5\' 3"  (1.6 m), weight 70.761 kg (156 lb), SpO2 97 %.  GEN:  Pleasant  patient lying in the stretcher in no acute distress; cooperative with exam. PSYCH:  alert and oriented x4; does not appear anxious or depressed; affect is appropriate. HEENT: Mucous membranes pink and anicteric; PERRLA; EOM intact; no cervical lymphadenopathy nor thyromegaly or carotid bruit; no JVD; There were no stridor. Neck is very supple. Breasts:: Not examined CHEST WALL: No tenderness CHEST: Normal respiration, clear to auscultation bilaterally.  HEART: Regular rate and rhythm.  There are no murmur, rub, or gallops.   BACK: No kyphosis or scoliosis; no CVA tenderness ABDOMEN: soft and non-tender; no masses, no organomegaly, normal abdominal bowel sounds; no pannus; no intertriginous candida. There is no rebound and no distention. Rectal Exam: Not done EXTREMITIES: No bone or joint deformity; age-appropriate arthropathy of the hands and knees; no edema; no ulcerations.  There is no  calf tenderness. Genitalia: not examined PULSES: 2+ and symmetric SKIN: Normal hydration no rash or ulceration CNS: Cranial nerves 2-12 grossly intact no focal lateralizing neurologic deficit.  Speech is fluent; uvula elevated with phonation, facial symmetry and tongue midline. DTR are normal bilaterally, cerebella exam is intact, barbinski is negative and strengths are equaled bilaterally.  No sensory loss.   Labs on Admission:  Basic Metabolic Panel:  Recent Labs Lab 02/01/15 0721 02/02/15 0445 02/03/15 0653 02/04/15 0709  NA 136 136 138 138  K 3.8 3.5 3.0* 3.6  CL 105 107 109 109  CO2 24 24 24 24   GLUCOSE 92 83 106* 96  BUN 8 10 10 8   CREATININE 0.39* 0.43* 0.47 0.36*  CALCIUM 8.5* 7.6* 7.5* 7.6*  MG 1.6*  --   --   --    Liver Function Tests:  Recent Labs Lab 02/01/15 0721 02/03/15 0653 02/04/15 0709  AST 88* 96* 79*  ALT 35 33 31  ALKPHOS 141* 104 99  BILITOT 10.6* 10.2* 7.2*  PROT 7.8 6.2* 5.9*  ALBUMIN 3.2*  2.5* 2.3*   CBC:  Recent Labs Lab 02/01/15 0721 02/02/15 0445 02/03/15 0653 02/04/15 0709 02/05/15 1008  WBC 7.2 6.9 7.0 5.6 6.8  NEUTROABS 6.2 5.1  --   --   --   HGB 8.7* 7.9* 6.7* 5.6* 6.7*  HCT 24.1* 21.7* 17.8* 14.9* 18.0*  MCV 107.6* 108.0* 106.6* 104.2* 101.1*  PLT 210 176 150 124* 157   Cardiac Enzymes:  Recent Labs Lab 02/01/15 0721 02/01/15 1555 02/02/15 0445  TROPONINI <0.03 <0.03 <0.03    Assessment/Plan Present on Admission:  . Sickle cell crisis . Sickle cell disease . Elevated LFTs . Chest pain . Anxiety state . Macrocytosis   PLAN:  Sickle cell crisis, continue with IVF, PCA and finish transfusion.   She is stable.    Other plans as per orders.  Code Status: FULL Haskel Khan, MD. [facility] Pager [phone] 7pm to 7am.  02/05/2015, 3:01 PM

## 2015-02-06 LAB — CULTURE, BLOOD (ROUTINE X 2)
Culture: NO GROWTH
Culture: NO GROWTH

## 2015-02-06 LAB — TYPE AND SCREEN
ABO/RH(D): A POS
ANTIBODY SCREEN: POSITIVE
DAT, IgG: NEGATIVE
UNIT DIVISION: 0
UNIT DIVISION: 0

## 2015-02-06 MED ORDER — DIPHENHYDRAMINE HCL 50 MG/ML IJ SOLN
INTRAMUSCULAR | Status: AC
  Administered 2015-02-06: 23:00:00
  Filled 2015-02-06: qty 1

## 2015-02-06 NOTE — Progress Notes (Signed)
[facility] PROGRESS NOTE  Dawn [name] FGH:[id number] DOB: 05/03/1981    PCP:   [name],[name], MD   HPI:   Dawn [name] is an 34 y.o. female admitted for sickle cell crisis on PCA pump. Her HB dropped to 5.6 g per dL, and after 2 units of PRBCs, rose to 8.9 g per dL . No longer had any chest pain.   Rewiew of Systems:  Constitutional: Negative for malaise, fever and chills. No significant weight loss or weight gain Eyes: Negative for eye pain, redness and discharge, diplopia, visual changes, or flashes of light. ENMT: Negative for ear pain, hoarseness, nasal congestion, sinus pressure and sore throat. No headaches; tinnitus, drooling, or problem swallowing. Cardiovascular: Negative for chest pain, palpitations, diaphoresis, dyspnea and peripheral edema. ; No orthopnea, PND Respiratory: Negative for cough, hemoptysis, wheezing and stridor. No pleuritic chestpain. Gastrointestinal: Negative for nausea, vomiting, diarrhea, constipation, abdominal pain, melena, blood in stool, hematemesis, jaundice and rectal bleeding.    Genitourinary: Negative for frequency, dysuria, incontinence,flank pain and hematuria; Musculoskeletal: Negative for back pain and neck pain. Negative for swelling and trauma.;  Skin: . Negative for pruritus, rash, abrasions, bruising and skin lesion.; ulcerations Neuro: Negative for headache, lightheadedness and neck stiffness. Negative for weakness, altered level of consciousness , altered mental status, extremity weakness, burning feet, involuntary movement, seizure and syncope.  Psych: negative for anxiety, depression, insomnia, tearfulness, panic attacks, hallucinations, paranoia, suicidal or homicidal ideation    Past Medical History  Diagnosis Date  . Sickle cell disease   . Sickle cell disease, type S   . Blood transfusion   . 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 HCAP (healthcare-associated pneumonia) 05/19/2013  . Sickle cell anemia      Past Surgical History  Procedure Laterality Date  . Cholecystectomy    .  left knee acl reconstruction    . Cesarean section      x 2  . Portacath placement    . Portacath placement Left 02/23/2013    Procedure: INSERTION PORT-A-CATH;  Surgeon: Donato Heinz, MD;  Location: [facility];  Service: General;  Laterality: Left;  . Port-a-cath removal Left 07/29/2013    Procedure: REMOVAL PORT-A-CATH;  Surgeon: Scherry Ran, MD;  Location: [facility];  Service: General;  Laterality: Left;  . Tee without cardioversion N/A 08/07/2013    Procedure: TRANSESOPHAGEAL ECHOCARDIOGRAM (TEE);  Surgeon: Arnoldo Lenis, MD;  Location: [facility];  Service: Cardiology;  Laterality: N/A;    Medications:  HOME MEDS: Prior to Admission medications   Medication Sig Start Date End Date Taking? Authorizing Provider  folic acid (FOLVITE) 1 MG tablet Take 1 tablet (1 mg total) by mouth daily. 03/31/14  Yes [name], FNP  hydroxyurea (HYDREA) 500 MG capsule TAKE 1 CAPSULE ON ODD DAYS AND 2 CAPSULES ON EVEN DAYS 09/02/14  Yes [name], FNP  loratadine (CLARITIN) 10 MG tablet Take 10 mg by mouth daily.   Yes Historical Provider, MD  Oxycodone HCl 10 MG TABS Take 1 tablet (10 mg total) by mouth every 4 (four) hours as needed (moderate pain). 12/13/14  Yes [name], FNP  promethazine (PHENERGAN) 12.5 MG tablet Take 1 tablet (12.5 mg total) by mouth every 6 (six) hours as needed for nausea. 12/13/14  Yes [name], FNP  Vitamin D, Ergocalciferol, (DRISDOL) 50000 UNITS CAPS capsule Take 1 capsule (50,000 Units total) by mouth every 7 (seven) days. Takes on Sundays. 03/31/14  Yes [name],  FNP     Allergies:  Allergies  Allergen Reactions  . Desferal [Deferoxamine] Hives    Local reaction on arm only during infusion. Can take with benadryl   . Latex Other (See Comments)    REACTION: Pt experiences a burning sensation on contacted skin areas  . Lisinopril Other (See  Comments) and Cough    REACTION: Sore/scratchy throat  . Tape Other (See Comments)    REACTION: Pt. Experiences a burning sensation on contacted skin areas    Social History:   reports that she has never smoked. She has quit using smokeless tobacco. She reports that she does not drink alcohol or use illicit drugs.  Family History: Family History  Problem Relation Age of Onset  . Sickle cell trait Mother   . Sickle cell trait Father   . Hypertension Father   . Diabetes Father   . Sickle cell trait Sister   . Cancer Paternal Aunt     Breast     Physical Exam: Filed Vitals:   02/06/15 0000 02/06/15 0400 02/06/15 0525 02/06/15 0800  BP:   127/80   Pulse:   94   Temp:   98.3 F (36.8 C)   TempSrc:   Oral   Resp: 18 18 18 18   Height:      Weight:      SpO2: 98% 98% 97% 98%   Blood pressure 127/80, pulse 94, temperature 98.3 F (36.8 C), temperature source Oral, resp. rate 18, height 5\' 3"  (1.6 m), weight 70.761 kg (156 lb), SpO2 98 %.  GEN:  Pleasant  patient lying in the stretcher in no acute distress; cooperative with exam. PSYCH:  alert and oriented x4; does not appear anxious or depressed; affect is appropriate. HEENT: Mucous membranes pink and anicteric; PERRLA; EOM intact; no cervical lymphadenopathy nor thyromegaly or carotid bruit; no JVD; There were no stridor. Neck is very supple. Breasts:: Not examined CHEST WALL: No tenderness CHEST: Normal respiration, clear to auscultation bilaterally.  HEART: Regular rate and rhythm.  There are no murmur, rub, or gallops.   BACK: No kyphosis or scoliosis; no CVA tenderness ABDOMEN: soft and non-tender; no masses, no organomegaly, normal abdominal bowel sounds; no pannus; no intertriginous candida. There is no rebound and no distention. Rectal Exam: Not done EXTREMITIES: No bone or joint deformity; age-appropriate arthropathy of the hands and knees; no edema; no ulcerations.  There is no calf tenderness. Genitalia: not  examined PULSES: 2+ and symmetric SKIN: Normal hydration no rash or ulceration CNS: Cranial nerves 2-12 grossly intact no focal lateralizing neurologic deficit.  Speech is fluent; uvula elevated with phonation, facial symmetry and tongue midline. DTR are normal bilaterally, cerebella exam is intact, barbinski is negative and strengths are equaled bilaterally.  No sensory loss.   Labs on Admission:  Basic Metabolic Panel:  Recent Labs Lab 02/01/15 0721 02/02/15 0445 02/03/15 0653 02/04/15 0709  NA 136 136 138 138  K 3.8 3.5 3.0* 3.6  CL 105 107 109 109  CO2 24 24 24 24   GLUCOSE 92 83 106* 96  BUN 8 10 10 8   CREATININE 0.39* 0.43* 0.47 0.36*  CALCIUM 8.5* 7.6* 7.5* 7.6*  MG 1.6*  --   --   --    Liver Function Tests:  Recent Labs Lab 02/01/15 0721 02/03/15 0653 02/04/15 0709  AST 88* 96* 79*  ALT 35 33 31  ALKPHOS 141* 104 99  BILITOT 10.6* 10.2* 7.2*  PROT 7.8 6.2* 5.9*  ALBUMIN 3.2* 2.5* 2.3*  CBC:  Recent Labs Lab 02/01/15 0721 02/02/15 0445 02/03/15 0653 02/04/15 0709 02/05/15 1008 02/05/15 2214  WBC 7.2 6.9 7.0 5.6 6.8  --   NEUTROABS 6.2 5.1  --   --   --   --   HGB 8.7* 7.9* 6.7* 5.6* 6.7* 8.3*  HCT 24.1* 21.7* 17.8* 14.9* 18.0* 22.2*  MCV 107.6* 108.0* 106.6* 104.2* 101.1*  --   PLT 210 176 150 124* 157  --    Cardiac Enzymes:  Recent Labs Lab 02/01/15 0721 02/01/15 1555 02/02/15 0445  TROPONINI <0.03 <0.03 <0.03    Assessment/Plan Present on Admission:  . Sickle cell crisis . Sickle cell disease . Elevated LFTs . Chest pain . Anxiety state . Macrocytosis  PLAN:  Will continue with IVF, oxygen, and IV PCA dilaudid.  Her Hb is up now. She is feeling better.    Other plans as per orders.  Code Status: FULL Haskel Khan, MD. [facility] Pager [phone] 7pm to 7am.  02/06/2015, 12:23 PM

## 2015-02-07 LAB — RETICULOCYTES
RBC.: 2.55 MIL/uL — AB (ref 3.87–5.11)
RETIC COUNT ABSOLUTE: 471.8 10*3/uL — AB (ref 19.0–186.0)
Retic Ct Pct: 18.5 % — ABNORMAL HIGH (ref 0.4–3.1)

## 2015-02-07 NOTE — Progress Notes (Signed)
UR chart review completed.  

## 2015-02-07 NOTE — Progress Notes (Signed)
[facility] PROGRESS NOTE  Dawn [name] GHW:[id number] DOB: 07-09-1981 DOA: 02/01/2015 PCP: [name],[name], MD  Assessment/Plan: Sickle cell crisis: pain fairly well controlled and  symptomatic improvement since transfusion 2 units PRBC's. . Continue vigourous IV fluids and full Dose Dilaudid PCA, Toradol. Oxygen saturation level 98% on room air.  Hemoglobin 8.3 HCT 22.2.   Active Problems: Chest pain; likely related to #1. Slight improvement. Troponin negative x3. EKG without acute changes. Repeat chest x-ray no acute cardiopulmonary abnormality. Management as above. continue oxygen supplementation as indicated. ED note refered to hypoxia however patient's oxygen saturation levels been greater than 92% on room air. She has no leukocytosis. max temp 99.2.  Blood culture no growth to date. Respiratory effort normal. monitor   Sickle cell disease/anemia: Patient with elevated bilirubin, retic level22.2. 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   Elevated LFTs: Related to #1. Monitor closely   Anxiety state: improved. continue anti-anxiety agent low dose prn   Code Status: full Family Communication: none present Disposition Plan: home when ready    Consultants:  none  Procedures:  none  Antibiotics:  none  HPI/Subjective: Sitting up in bed eating lunch. Reports minimal improvement in chest pain.   Objective: Filed Vitals:   02/07/15 1336  BP:   Pulse:   Temp:   Resp: 20    Intake/Output Summary (Last 24 hours) at 02/07/15 1427 Last data filed at 02/06/15 1557  Gross per 24 hour  Intake 839.58 ml  Output      0 ml  Net 839.58 ml   Filed Weights   02/01/15 0626 02/01/15 1608  Weight: 72.576 kg (160 lb) 70.761 kg (156 lb)    Exam:   General:  Well nourished appears slightly uncomfortable  Cardiovascular: S1 and S2, tachycardia, no MGR No LE edema  Respiratory: normal  effort slightly shallow, bs clear bilaterally BS slightly distant, no wheeze no rhonchi  Abdomen: soft +BS non-tender to palpation  Musculoskeletal: no clubbing or cyanosis   Data Reviewed: Basic Metabolic Panel:  Recent Labs Lab 02/01/15 0721 02/02/15 0445 02/03/15 0653 02/04/15 0709  NA 136 136 138 138  K 3.8 3.5 3.0* 3.6  CL 105 107 109 109  CO2 24 24 24 24   GLUCOSE 92 83 106* 96  BUN 8 10 10 8   CREATININE 0.39* 0.43* 0.47 0.36*  CALCIUM 8.5* 7.6* 7.5* 7.6*  MG 1.6*  --   --   --    Liver Function Tests:  Recent Labs Lab 02/01/15 0721 02/03/15 0653 02/04/15 0709  AST 88* 96* 79*  ALT 35 33 31  ALKPHOS 141* 104 99  BILITOT 10.6* 10.2* 7.2*  PROT 7.8 6.2* 5.9*  ALBUMIN 3.2* 2.5* 2.3*   No results for input(s): LIPASE, AMYLASE in the last 168 hours. No results for input(s): AMMONIA in the last 168 hours. CBC:  Recent Labs Lab 02/01/15 0721 02/02/15 0445 02/03/15 0653 02/04/15 0709 02/05/15 1008 02/05/15 2214  WBC 7.2 6.9 7.0 5.6 6.8  --   NEUTROABS 6.2 5.1  --   --   --   --   HGB 8.7* 7.9* 6.7* 5.6* 6.7* 8.3*  HCT 24.1* 21.7* 17.8* 14.9* 18.0* 22.2*  MCV 107.6* 108.0* 106.6* 104.2* 101.1*  --   PLT 210 176 150 124* 157  --    Cardiac Enzymes:  Recent Labs Lab 02/01/15 0721 02/01/15 1555 02/02/15 0445  TROPONINI <0.03 <0.03 <0.03   BNP (last  3 results) No results for input(s): BNP in the last 8760 hours.  ProBNP (last 3 results) No results for input(s): PROBNP in the last 8760 hours.  CBG: No results for input(s): GLUCAP in the last 168 hours.  Recent Results (from the past 240 hour(s))  Culture, Urine     Status: None   Collection Time: 02/01/15  3:22 PM  Result Value Ref Range Status   Specimen Description URINE, CLEAN CATCH  Final   Special Requests NONE  Final   Colony Count NO GROWTH Performed at [facility]   Final   Culture NO GROWTH Performed at [facility]   Final   Report Status 02/02/2015 FINAL  Final   Culture, blood (routine x 2)     Status: None   Collection Time: 02/01/15  3:55 PM  Result Value Ref Range Status   Specimen Description BLOOD LEFT WRIST  Final   Special Requests BOTTLES DRAWN AEROBIC AND ANAEROBIC 10CC  Final   Culture NO GROWTH 5 DAYS  Final   Report Status 02/06/2015 FINAL  Final  Culture, blood (routine x 2)     Status: None   Collection Time: 02/01/15  4:00 PM  Result Value Ref Range Status   Specimen Description BLOOD LEFT HAND  Final   Special Requests BOTTLES DRAWN AEROBIC AND ANAEROBIC 6CC  Final   Culture NO GROWTH 5 DAYS  Final   Report Status 02/06/2015 FINAL  Final     Studies: No results found.  Scheduled Meds: . sodium chloride   Intravenous Once  . antiseptic oral rinse  7 mL Mouth Rinse BID  . enoxaparin (LOVENOX) injection  40 mg Subcutaneous Q24H  . folic acid  1 mg Oral Daily  . HYDROmorphone PCA 0.3 mg/mL   Intravenous Once  . HYDROmorphone PCA 0.3 mg/mL   Intravenous 6 times per day  . hydroxyurea  1,000 mg Oral Daily  . loratadine  10 mg Oral Daily  . pantoprazole  40 mg Oral Daily   Or  . pantoprazole (PROTONIX) IV  40 mg Intravenous Daily  . senna-docusate  1 tablet Oral BID  . Vitamin D (Ergocalciferol)  50,000 Units Oral Q7 days   Continuous Infusions: . sodium chloride 125 mL/hr at 02/07/15 0757    Principal Problem:   Sickle cell crisis Active Problems:   Sickle cell disease   Elevated LFTs   Chest pain   Macrocytosis   Anxiety state    Time spent: 30 minutes    [facility] Pager [phone]. If 7PM-7AM, please contact night-coverage at [email or web address], password [facility] 02/07/2015, 2:27 PM  LOS: 6 days

## 2015-02-08 LAB — CBC
HCT: 24.7 % — ABNORMAL LOW (ref 36.0–46.0)
Hemoglobin: 8.8 g/dL — ABNORMAL LOW (ref 12.0–15.0)
MCH: 35.1 pg — AB (ref 26.0–34.0)
MCHC: 35.6 g/dL (ref 30.0–36.0)
MCV: 98.4 fL (ref 78.0–100.0)
PLATELETS: ADEQUATE 10*3/uL (ref 150–400)
RBC: 2.51 MIL/uL — ABNORMAL LOW (ref 3.87–5.11)
RDW: 28.2 % — AB (ref 11.5–15.5)
WBC: 4.9 10*3/uL (ref 4.0–10.5)

## 2015-02-08 LAB — CREATININE, SERUM
CREATININE: 0.4 mg/dL — AB (ref 0.44–1.00)
GFR calc Af Amer: >60 mL/min (ref >60)

## 2015-02-08 MED ORDER — OXYCODONE HCL 5 MG PO TABS
10.0000 mg | ORAL_TABLET | ORAL | Status: DC | PRN
  Administered 2015-02-08 – 2015-02-09 (×3): 10 mg via ORAL
  Filled 2015-02-08 (×4): qty 2

## 2015-02-08 NOTE — Progress Notes (Signed)
[facility] PROGRESS NOTE  Dawn [name] LYY:[id number] DOB: 1981-01-24 DOA: 02/01/2015 PCP: [name],[name], MD  Assessment/Plan: Sickle cell crisis: pain  well controlled and continues with symptomatic improvement since transfusion 2 units PRBC's. Will transition to po pain medication and reduce rate IV fluids. Continue Toradol. Oxygen saturation level 98% on room air. Hemoglobin 8.8 HCT 24.7.  Active Problems: Chest pain; likely related to #1. Continues to improve. Troponin negative x3. EKG without acute changes. Repeat chest x-ray no acute cardiopulmonary abnormality. Management as above. ED note refered to hypoxia however patient's oxygen saturation levels been greater than 92% on room air. She has no leukocytosis. max temp 99.4. Blood culture no growth to date. Respiratory effort normal. monitor   Sickle cell disease/anemia: s/p 2 units PRBC's. 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   Elevated LFTs: Related to #1. Monitor closely  Anxiety state: improved. continue anti-anxiety agent low dose prn  Code Status: full Family Communication: none present  Disposition Plan: home hopefully tomorrow   Consultants:  none  Procedures:  none  Antibiotics:  none  HPI/Subjective: Sitting up in bed watching TV reports some improved pain control. Agreeable to transitioning to oral pain med.   Objective: Filed Vitals:   02/08/15 0731  BP:   Pulse:   Temp:   Resp: 20    Intake/Output Summary (Last 24 hours) at 02/08/15 1120 Last data filed at 02/08/15 0800  Gross per 24 hour  Intake 5724.59 ml  Output      0 ml  Net 5724.59 ml   Filed Weights   02/01/15 0626 02/01/15 1608  Weight: 72.576 kg (160 lb) 70.761 kg (156 lb)    Exam:   General:  Appears comfortable   Cardiovascular: RRR no MGR no LE edema  Respiratory: normal effort BS clear bilaterally no wheeze  Abdomen:  non-distended soft +BS no guarding   Musculoskeletal: joints without swelling/erythema   Data Reviewed: Basic Metabolic Panel:  Recent Labs Lab 02/02/15 0445 02/03/15 0653 02/04/15 0709 02/08/15 0557  NA 136 138 138  --   K 3.5 3.0* 3.6  --   CL 107 109 109  --   CO2 24 24 24   --   GLUCOSE 83 106* 96  --   BUN 10 10 8   --   CREATININE 0.43* 0.47 0.36* 0.40*  CALCIUM 7.6* 7.5* 7.6*  --    Liver Function Tests:  Recent Labs Lab 02/03/15 0653 02/04/15 0709  AST 96* 79*  ALT 33 31  ALKPHOS 104 99  BILITOT 10.2* 7.2*  PROT 6.2* 5.9*  ALBUMIN 2.5* 2.3*   No results for input(s): LIPASE, AMYLASE in the last 168 hours. No results for input(s): AMMONIA in the last 168 hours. CBC:  Recent Labs Lab 02/02/15 0445 02/03/15 0653 02/04/15 0709 02/05/15 1008 02/05/15 2214 02/08/15 0557  WBC 6.9 7.0 5.6 6.8  --  4.9  NEUTROABS 5.1  --   --   --   --   --   HGB 7.9* 6.7* 5.6* 6.7* 8.3* 8.8*  HCT 21.7* 17.8* 14.9* 18.0* 22.2* 24.7*  MCV 108.0* 106.6* 104.2* 101.1*  --  98.4  PLT 176 150 124* 157  --  PLATELET CLUMPS NOTED ON SMEAR, COUNT APPEARS ADEQUATE   Cardiac Enzymes:  Recent Labs Lab 02/01/15 1555 02/02/15 0445  TROPONINI <0.03 <0.03   BNP (last 3 results) No results for input(s): BNP in the last 8760 hours.  ProBNP (  last 3 results) No results for input(s): PROBNP in the last 8760 hours.  CBG: No results for input(s): GLUCAP in the last 168 hours.  Recent Results (from the past 240 hour(s))  Culture, Urine     Status: None   Collection Time: 02/01/15  3:22 PM  Result Value Ref Range Status   Specimen Description URINE, CLEAN CATCH  Final   Special Requests NONE  Final   Colony Count NO GROWTH Performed at [facility]   Final   Culture NO GROWTH Performed at [facility]   Final   Report Status 02/02/2015 FINAL  Final  Culture, blood (routine x 2)     Status: None   Collection Time: 02/01/15  3:55 PM  Result Value Ref Range  Status   Specimen Description BLOOD LEFT WRIST  Final   Special Requests BOTTLES DRAWN AEROBIC AND ANAEROBIC 10CC  Final   Culture NO GROWTH 5 DAYS  Final   Report Status 02/06/2015 FINAL  Final  Culture, blood (routine x 2)     Status: None   Collection Time: 02/01/15  4:00 PM  Result Value Ref Range Status   Specimen Description BLOOD LEFT HAND  Final   Special Requests BOTTLES DRAWN AEROBIC AND ANAEROBIC 6CC  Final   Culture NO GROWTH 5 DAYS  Final   Report Status 02/06/2015 FINAL  Final     Studies: No results found.  Scheduled Meds: . sodium chloride   Intravenous Once  . antiseptic oral rinse  7 mL Mouth Rinse BID  . enoxaparin (LOVENOX) injection  40 mg Subcutaneous Q24H  . folic acid  1 mg Oral Daily  . hydroxyurea  1,000 mg Oral Daily  . loratadine  10 mg Oral Daily  . pantoprazole  40 mg Oral Daily   Or  . pantoprazole (PROTONIX) IV  40 mg Intravenous Daily  . senna-docusate  1 tablet Oral BID  . Vitamin D (Ergocalciferol)  50,000 Units Oral Q7 days   Continuous Infusions: . sodium chloride 125 mL/hr at 02/08/15 0604    Principal Problem:   Sickle cell crisis Active Problems:   Sickle cell disease   Elevated LFTs   Chest pain   Macrocytosis   Anxiety state    Time spent: 20 minutes    [facility] Pager [phone]. If 7PM-7AM, please contact night-coverage at [email or web address], password [facility] 02/08/2015, 11:20 AM  LOS: 7 days

## 2015-02-08 NOTE — Care Management Note (Signed)
Case Management Note  Patient Details  Name: Dawn [name] MRN: [id number] Date of Birth: December 20, 1980  Subjective/Objective:                    Action/Plan:   Expected Discharge Date:  02/04/15               Expected Discharge Plan:  Home/Self Care  In-House Referral:  NA  Discharge planning Services  CM Consult  Post Acute Care Choice:  NA Choice offered to:  NA  DME Arranged:    DME Agency:     HH Arranged:    HH Agency:     Status of Service:  Completed, signed off  Medicare Important Message Given:  Yes Date Medicare IM Given:  02/04/15 Medicare IM give by:  Christinia Gully, RN BSN CM Date Additional Medicare IM Given:    Additional Medicare Important Message give by:     If discussed at [place] of Stay Meetings, dates discussed: 02/08/15   Additional Comments:  Joylene Draft, RN 02/08/2015, 3:28 PM

## 2015-02-09 MED ORDER — ALPRAZOLAM 0.25 MG PO TABS: 0.2500 mg | ORAL_TABLET | Freq: Three times a day (TID) | ORAL | Status: DC | PRN

## 2015-02-09 MED ORDER — PROMETHAZINE HCL 12.5 MG PO TABS: 12.5000 mg | ORAL_TABLET | Freq: Four times a day (QID) | ORAL | Status: DC | PRN

## 2015-02-09 MED ORDER — POLYETHYLENE GLYCOL 3350 17 G PO PACK: 17.0000 g | PACK | Freq: Every day | ORAL | Status: DC | PRN

## 2015-02-09 MED ORDER — OXYCODONE HCL 10 MG PO TABS: 10.0000 mg | ORAL_TABLET | ORAL | Status: DC | PRN

## 2015-02-09 MED ORDER — HYDROMORPHONE HCL 1 MG/ML IJ SOLN
1.0000 mg | Freq: Once | INTRAMUSCULAR | Status: AC
  Administered 2015-02-09: 1 mg via INTRAVENOUS
  Filled 2015-02-09: qty 1

## 2015-02-09 NOTE — Progress Notes (Signed)
Discharge instructions, work note, and prescriptions given, verbalized understanding, out in stable condition via w/c with staff.

## 2015-02-09 NOTE — Care Management Note (Signed)
Case Management Note  Patient Details  Name: Dawn [name] MRN: [id number] Date of Birth: 01/19/1981  Subjective/Objective:                    Action/Plan:   Expected Discharge Date:  02/04/15               Expected Discharge Plan:  Home/Self Care  In-House Referral:  NA  Discharge planning Services  CM Consult  Post Acute Care Choice:  NA Choice offered to:  NA  DME Arranged:    DME Agency:     HH Arranged:    HH Agency:     Status of Service:  Completed, signed off  Medicare Important Message Given:  Yes Date Medicare IM Given:  02/04/15 Medicare IM give by:  Christinia Gully, RN BSN CM Date Additional Medicare IM Given:  02/09/15 Additional Medicare Important Message give by:  Christinia Gully, RN BSN CM  If discussed at [employer], dates discussed:    Additional Comments: Pt to be discharged home today. No CM needs noted. Christinia Gully [place], RN 02/09/2015, 11:17 AM

## 2015-02-09 NOTE — Discharge Summary (Signed)
Physician Discharge Summary  Dawn [name] FBP:[id number] DOB: 01-01-1981 DOA: 02/01/2015  PCP: [name],[name], MD  Admit date: 02/01/2015 Discharge date: 02/09/2015  Time spent: 40 minutes  Recommendations for Outpatient Follow-up:  1. Has appointment with Dr Zigmund Daniel 02/14/15   Discharge Diagnoses:  Principal Problem:   Sickle cell crisis Active Problems:   Sickle cell disease   Elevated LFTs   Chest pain   Macrocytosis   Anxiety state   Discharge Condition: stable  Diet recommendation: regular  Filed Weights   02/01/15 0626 02/01/15 1608  Weight: 72.576 kg (160 lb) 70.761 kg (156 lb)    History of present illness:  Dawn [name] is a 34 y.o. female known to our service with known Hb SS and known to be compliant with disease management presented to the emergency department on 02/01/15 with one-day history of left upper chest and back pain worsening and persistent. Initial evaluation in the emergency department revealed mild tachycardia, vomiting, uncontrolled pain.  Patient stateed she was in her usual state of health until 01/31/15 about 2:00 which she developed left anterior chest pain radiating to her side and upper back. She rated the pain a 10 out of 10 and described it as throbbing and constant. She took her home pain medicine without relief. She stated pain in her back  typical of her crisis pain however pain on the left chest somewhat reminiscent of when she had acute chest syndrome as a teenager. In addition she awakened with shortness of breath. She denied cough chills abdominal pain diarrhea nausea or vomiting. She reported subjective fever. He denied dysuria hematuria frequency or urgency. She denied headache visual disturbances numbness or tingling of extremities.  Workup in the emergency department included complete blood count significant for hemoglobin of 8.7 hematocrit of 82.4, basic metabolic panel significant for creatinine of 0.39 calcium of 8.5. Retic count 22.5.  Initial troponin negative, hepatic function panel AST 88, alkaline phosphatase 141 total bili 10.6.  In the emergency department max temp 99.2 mildly tachycardic with a rate of 105 hemodynamically stable and not hypoxic.  In the emergency department she was provided with 1 L of normal saline 2 mg of Dilaudid 2 and 12.5 mg of Phenergan 2. At the time of admission she continued to complain of pain and vomited approximately 500 mL of thick greenish emesis.  Hospital Course:  Sickle cell crisis: admitted to floor for pain control. Provided with full dose dilaudid PCA and  toradol for 5 days on admission. Oxycodone added for breakthrough 5/14.  Pain well controlled and symptomatic improvement occurred after transfusion 2 units PRBC's.  Transitioned to po pain medication day before discharge. At discharge pain controlled.  Oxygen saturation level 98% on room air. Hemoglobin 8.8 HCT 24.7 at discharge.  Follow up with PCP scheduled for 02/14/15 Active Problems: Chest pain; likely related to #1. Troponin negative x3. EKG without acute changes. Repeat chest x-ray no acute cardiopulmonary abnormality. Management as above. Gradually improved.  ED note refered to hypoxia however patient's oxygen saturation levels been greater than 92% on room air. She had no leukocytosis. max temp 99.4. Blood culture no growth to date. Respiratory effort normal at discharge   Sickle cell disease/anemia: s/p 2 units PRBC's. 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 Follow up with Dr Zigmund Daniel as scheduled   Elevated LFTs: Related to #1.    Anxiety state: improved with . Provided with  anti-anxiety agent low dose  prn  Procedures:  none  Consultations:  none  Discharge Exam: Filed Vitals:   02/08/15 2224  BP: 127/73  Pulse: 94  Temp: 98.7 F (37.1 C)  Resp: 20    General: well nourished appears comfortable Cardiovascular: RRR no MGR no  LE edema Respiratory: normal effort good air movement no rhonchi nor crackles.   Discharge Instructions    Current Discharge Medication List    START taking these medications   Details  ALPRAZolam (XANAX) 0.25 MG tablet Take 1 tablet (0.25 mg total) by mouth 3 (three) times daily as needed for anxiety. Qty: 9 tablet, Refills: 0    polyethylene glycol (MIRALAX / GLYCOLAX) packet Take 17 g by mouth daily as needed for mild constipation. Qty: 14 each, Refills: 0      CONTINUE these medications which have CHANGED   Details  Oxycodone HCl 10 MG TABS Take 1 tablet (10 mg total) by mouth every 4 (four) hours as needed (moderate pain). 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 Qty: 30 tablet, Refills: 11    hydroxyurea (HYDREA) 500 MG capsule TAKE 1 CAPSULE ON ODD DAYS AND 2 CAPSULES ON EVEN DAYS Qty: 45 capsule, Refills: 2    loratadine (CLARITIN) 10 MG tablet Take 10 mg by mouth daily.    Vitamin D, Ergocalciferol, (DRISDOL) 50000 UNITS CAPS capsule Take 1 capsule (50,000 Units total) by mouth every 7 (seven) days. Takes on Sundays. Qty: 30 capsule, Refills: 11   Associated Diagnoses: Vitamin D deficiency       Allergies  Allergen Reactions  . Desferal [Deferoxamine] Hives    Local reaction on arm only during infusion. Can take with benadryl   . Latex Other (See Comments)    REACTION: Pt experiences a burning sensation on contacted skin areas  . Lisinopril Other (See Comments) and Cough    REACTION: Sore/scratchy throat  . Tape Other (See Comments)    REACTION: Pt. Experiences a burning  sensation on contacted skin areas   Follow-up Information    Follow up with [name],[name], MD On 02/14/2015.   Specialty:  Internal Medicine   Why:  appointment at [facility] information:   [place] [phone]        The results of significant diagnostics from this hospitalization (including imaging, microbiology, ancillary and laboratory) are listed below for reference.    Significant Diagnostic Studies: Dg Chest 2 View  02/02/2015   CLINICAL DATA:  DYSPNEA AND LEFT SIDE CHEST PAIN X 1 DAY, SICKLE CELL, NON SMOKER  EXAM: CHEST  2 VIEW  COMPARISON:  02/01/2015  FINDINGS: Cardiac silhouette is borderline enlarged. Normal mediastinal and hilar contours.  Lungs are clear.  No pleural effusion or pneumothorax.  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 This is stable.  IMPRESSION: No acute cardiopulmonary disease.   Electronically Signed   By: Lajean Manes M.D.   On: 02/02/2015 08:46   Dg Chest 2 View  02/01/2015   CLINICAL DATA:  34 year old female with left chest pain since last night. Initial encounter. Current history of sickle cell disease.  EXAM: CHEST  2 VIEW  COMPARISON:  12/17/2014 and earlier.  FINDINGS: Stable lung volumes. Normal cardiac size and mediastinal contours. Visualized tracheal air column is within normal limits. No pneumothorax, pulmonary edema, pleural effusion or confluent pulmonary opacity. Stable cholecystectomy clips. Stable bilateral humeral head sclerosis. Elsewhere bone mineralization appears within normal limits. No acute osseous abnormality identified.  IMPRESSION: 1.  No acute cardiopulmonary abnormality. 2. 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   Electronically Signed   By: Genevie Ann M.D.   On: 02/01/2015 07:29    Microbiology: Recent Results (from the past 240 hour(s))  Culture, Urine     Status: None    Collection Time: 02/01/15  3:22 PM  Result Value Ref Range Status   Specimen Description URINE, CLEAN CATCH  Final   Special Requests NONE  Final   Colony Count NO GROWTH Performed at [facility]   Final   Culture NO GROWTH Performed at [facility]   Final   Report Status 02/02/2015 FINAL  Final  Culture, blood (routine x 2)     Status: None   Collection Time: 02/01/15  3:55 PM  Result Value Ref Range Status   Specimen Description BLOOD LEFT WRIST  Final   Special Requests BOTTLES DRAWN AEROBIC AND ANAEROBIC 10CC  Final   Culture NO GROWTH 5 DAYS  Final   Report Status 02/06/2015 FINAL  Final  Culture, blood (routine x 2)     Status: None   Collection Time: 02/01/15  4:00 PM  Result Value Ref Range Status   Specimen Description BLOOD LEFT HAND  Final   Special Requests BOTTLES DRAWN AEROBIC AND ANAEROBIC 6CC  Final   Culture NO GROWTH 5 DAYS  Final   Report Status 02/06/2015 FINAL  Final     Labs: Basic Metabolic Panel:  Recent Labs Lab 02/03/15 0653 02/04/15 0709 02/08/15 0557  NA 138 138  --   K 3.0* 3.6  --   CL 109 109  --   CO2 24 24  --   GLUCOSE 106* 96  --   BUN 10 8  --   CREATININE 0.47 0.36* 0.40*  CALCIUM 7.5* 7.6*  --    Liver Function Tests:  Recent Labs Lab 02/03/15 0653 02/04/15 0709  AST 96* 79*  ALT 33 31  ALKPHOS 104 99  BILITOT 10.2* 7.2*  PROT 6.2* 5.9*  ALBUMIN 2.5* 2.3*   No results for input(s): LIPASE, AMYLASE in the last 168 hours. No results for input(s): AMMONIA in the last 168 hours. CBC:  Recent Labs Lab 02/03/15 0653 02/04/15 0709 02/05/15 1008 02/05/15 2214 02/08/15 0557  WBC 7.0 5.6 6.8  --  4.9  HGB 6.7* 5.6* 6.7* 8.3* 8.8*  HCT 17.8* 14.9* 18.0* 22.2* 24.7*  MCV 106.6* 104.2* 101.1*  --  98.4  PLT 150 124* 157  --  PLATELET CLUMPS NOTED ON SMEAR, COUNT APPEARS ADEQUATE   Cardiac Enzymes: No results for input(s): CKTOTAL, CKMB, CKMBINDEX, TROPONINI in the last 168 hours. BNP: BNP (last 3  results) No results for input(s): BNP in the last 8760 hours.  ProBNP (last 3 results) No results for input(s): PROBNP in the last 8760 hours.  CBG: No results for input(s): GLUCAP in the last 168 hours.     SignedRadene Gunning  [facility] 02/09/2015, 10:58 AM

## 2015-02-14 ENCOUNTER — Encounter: Payer: Self-pay | Admitting: Internal Medicine

## 2015-02-14 ENCOUNTER — Ambulatory Visit (INDEPENDENT_AMBULATORY_CARE_PROVIDER_SITE_OTHER): Payer: BLUE CROSS/BLUE SHIELD | Admitting: Internal Medicine

## 2015-02-14 VITALS — BP 135/79 | HR 93 | Temp 97.6°F | Wt 151.0 lb

## 2015-02-14 DIAGNOSIS — D571 Sickle-cell disease without crisis: Secondary | ICD-10-CM | POA: Diagnosis not present

## 2015-02-14 MED ORDER — OXYCODONE HCL 10 MG PO TABS: 10.0000 mg | ORAL_TABLET | ORAL | Status: DC | PRN

## 2015-02-14 MED ORDER — HYDROXYUREA 500 MG PO CAPS: 1000.0000 mg | ORAL_CAPSULE | Freq: Every day | ORAL | Status: DC

## 2015-02-14 NOTE — Progress Notes (Signed)
Patient ID: Dawn [name], female   DOB: 10-Aug-1981, 34 y.o.   MRN: [id number]   Dawn [name], is a 34 y.o. female  OFB:[id number]  POE:[id number]  DOB - [birth year]  CC:  Chief Complaint  Patient presents with  . Follow-up       HPI: Dawn [name] is a 34 y.o. female here today to follow up after hospitalization. She was hospitalized for 1 week with a crisis. During the hsoptalization she received a transfusion of 2 units of RBC's. She has been compliant with Hydrea.  She reports that she has been doing very well and is agreeable increasing the Hydrea. She has no c/o at this time.   Today she look well and reports that she feels well.  Patient has No headache, No chest pain, No abdominal pain - No Nausea, No new weakness tingling or numbness, No Cough - SOB.  Allergies  Allergen Reactions  . Desferal [Deferoxamine] Hives    Local reaction on arm only during infusion. Can take with benadryl   . Latex Other (See Comments)    REACTION: Pt experiences a burning sensation on contacted skin areas  . Lisinopril Other (See Comments) and Cough    REACTION: Sore/scratchy throat  . Tape Other (See Comments)    REACTION: Pt. Experiences a burning sensation on contacted skin areas   Past Medical History  Diagnosis Date  . Sickle cell disease   . Sickle cell disease, type S   . Blood transfusion   . 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 HCAP (healthcare-associated pneumonia) 05/19/2013  . Sickle cell anemia    Current Outpatient Prescriptions on File Prior to Visit  Medication Sig Dispense Refill  . folic acid (FOLVITE) 1 MG tablet Take 1 tablet (1 mg total) by mouth daily. 30 tablet 11  . hydroxyurea (HYDREA) 500 MG capsule TAKE 1 CAPSULE ON ODD DAYS AND 2 CAPSULES ON EVEN DAYS 45 capsule 2  . loratadine (CLARITIN) 10 MG tablet Take 10 mg by mouth daily.    . Oxycodone HCl 10 MG TABS Take 1 tablet (10 mg total) by mouth every 4 (four) hours as needed (moderate pain). 24 tablet 0  .  polyethylene glycol (MIRALAX / GLYCOLAX) packet Take 17 g by mouth daily as needed for mild constipation. 14 each 0  . promethazine (PHENERGAN) 12.5 MG tablet Take 1 tablet (12.5 mg total) by mouth every 6 (six) hours as needed for nausea. 12 tablet 0  . Vitamin D, Ergocalciferol, (DRISDOL) 50000 UNITS CAPS capsule Take 1 capsule (50,000 Units total) by mouth every 7 (seven) days. Takes on Sundays. 30 capsule 11  . ALPRAZolam (XANAX) 0.25 MG tablet Take 1 tablet (0.25 mg total) by mouth 3 (three) times daily as needed for anxiety. (Patient not taking: Reported on 02/14/2015) 9 tablet 0   No current facility-administered medications on file prior to visit.   Family History  Problem Relation Age of Onset  . Sickle cell trait Mother   . Sickle cell trait Father   . Hypertension Father   . Diabetes Father   . Sickle cell trait Sister   . Cancer Paternal Aunt     Breast   History   Social History  . Marital Status: Single    Spouse Name: N/A  . Number of Children: N/A  . Years of Education: N/A   Occupational History  . Not on file.   Social History Main Topics  . Smoking status: Never Smoker   . Smokeless tobacco: Former  User  . Alcohol Use: No  . Drug Use: No  . Sexual Activity: Yes    Birth Control/ Protection: IUD   Other Topics Concern  . Not on file   Social History Narrative    Review of Systems: Constitutional: Negative for fever, chills, diaphoresis, activity change, appetite change and fatigue. HENT: Negative for ear pain, nosebleeds, congestion, facial swelling, rhinorrhea, neck pain, neck stiffness and ear discharge.  Eyes: Negative for pain, discharge, redness, itching and visual disturbance. Respiratory: Negative for cough, choking, chest tightness, shortness of breath, wheezing and stridor.  Cardiovascular: Negative for chest pain, palpitations and leg swelling. Gastrointestinal: Negative for abdominal distention. Genitourinary: Negative for dysuria, urgency,  frequency, hematuria, flank pain, decreased urine volume, difficulty urinating and dyspareunia.  Musculoskeletal: Negative for back pain, joint swelling, arthralgia and gait problem. Neurological: Negative for dizziness, tremors, seizures, syncope, facial asymmetry, speech difficulty, weakness, light-headedness, numbness and headaches.  Hematological: Negative for adenopathy. Does not bruise/bleed easily. Psychiatric/Behavioral: Negative for hallucinations, behavioral problems, confusion, dysphoric mood, decreased concentration and agitation.     Objective:   Filed Vitals:   02/14/15 1513  BP: 135/79  Pulse: 93  Temp: 97.6 F (36.4 C)    Physical Exam: Constitutional: Patient appears well-developed and well-nourished. No distress. HENT: Normocephalic, atraumatic, External right and left ear normal. Oropharynx is clear and moist.  Eyes: Conjunctivae and EOM are normal. PERRLA, no scleral icterus. Neck: Normal ROM. Neck supple. No JVD. No tracheal deviation. No thyromegaly. CVS: RRR, S1/S2 +, no murmurs, no gallops, no carotid bruit.  Pulmonary: Effort and breath sounds normal, no stridor, rhonchi, wheezes, rales.  Abdominal: Soft. BS +, no distension, tenderness, rebound or guarding.  Musculoskeletal: Normal range of motion. No edema and no tenderness.  Lymphadenopathy: No lymphadenopathy noted, cervical, inguinal or axillary Neuro: Alert. Normal reflexes, muscle tone coordination. No cranial nerve deficit. Skin: Skin is warm and dry. No rash noted. Not diaphoretic. No erythema. No pallor. Psychiatric: Normal mood and affect. Behavior, judgment, thought content normal.  Lab Results  Component Value Date   WBC 4.9 02/08/2015   HGB 8.8* 02/08/2015   HCT 24.7* 02/08/2015   MCV 98.4 02/08/2015   PLT  02/08/2015    PLATELET CLUMPS NOTED ON SMEAR, COUNT APPEARS ADEQUATE   Lab Results  Component Value Date   CREATININE 0.40* 02/08/2015   BUN 8 02/04/2015   NA 138 02/04/2015   K  3.6 02/04/2015   CL 109 02/04/2015   CO2 24 02/04/2015    No results found for: HGBA1C Lipid Panel  No results found for: CHOL, TRIG, HDL, CHOLHDL, VLDL, LDLCALC     Assessment and plan:  1. Hb-SS disease without crisis 1. Sickle cell disease - Will increase Hydrea from alternating 1000 mg and 500 mg every other day to 1000 mg daily. We discussed the need for good hydration, monitoring of hydration status, avoidance of heat, cold, stress, and infection triggers. We discussed the risks and benefits of Hydrea, including bone marrow suppression, the possibility of GI upset, skin ulcers, hair thinning, and teratogenicity. The patient was reminded of the need to seek medical attention of any symptoms of bleeding, anemia, or infection. Continue folic acid 1 mg daily to prevent aplastic bone marrow crises.  Check CBC with diff in one month.  2. Pulmonary evaluation - Patient denies severe recurrent wheezes, shortness of breath with exercise, or persistent cough. If these symptoms develop, pulmonary function tests with spirometry will be ordered, and if abnormal, plan on referral to  Pulmonology for further evaluation.  3. Cardiac - Routine screening for pulmonary hypertension is not recommended.  4. Eye - High risk of proliferative retinopathy. Annual eye exam with retinal exam recommended to patient. Pt has eye appointment scheduled for next month.  5. Immunization status - She declines Meningococcal vaccine today. She is up to date with and Pneumococcal vaccines.Yearly influenza vaccination is recommended.   6. Acute and chronic painful episodes - We agreed on Oxycodone 10 mg #60 tabs/month. Pt is low risk and uses opioids only minimally for acute pain.  She will be seen every 3 months for medication evaluation and adjustment of pain regimen. We discussed that pt is to receive her Schedule II prescriptions only from [place]. Pt is also aware that the prescription history is available to [place] online through  the [facility] CSRS. Controlled substance agreement signed (Date). We reminded (Pt) that she is to  be seen for follow within three months of prescription being requested. We reviewed the terms of our pain agreement, including the need to keep medicines in a safe locked location away from children or pets, and the need to report excess sedation or constipation, measures to avoid constipation, and policies related to early refills and stolen prescriptions. According to the [place] [facility] program, we have reviewed details related to analgesia, adverse effects, aberrant behaviors.  7. Iron overload from chronic transfusion.  Last Ferritin levels were <1000 1 year ago. Will need to check levels since she has had transfusions since then.  8. Vitamin D deficiency - Drisdol 50,000 units weekly, we encouraged her to take it.   The above recommendations are taken from the NIH Evidence-Based Management of Sickle Cell Disease: Expert Panel Report, 20149.   Chronic medical problems including diabetes, hypertension and COPD. We recommended she try to find a PCP in [place].   - Oxycodone HCl 10 MG TABS; Take 1 tablet (10 mg total) by mouth every 4 (four) hours as needed (moderate pain).  Dispense: 60 tablet; Refill: 0 - hydroxyurea (HYDREA) 500 MG capsule; Take 2 capsules (1,000 mg total) by mouth daily. May take with food to minimize GI side effects.  Dispense: 60 capsule; Refill: 11    Follow-up 4 weeks for Hb SS without crisis,  The patient was given clear instructions to go to ER or return to medical center if symptoms don't improve, worsen or new problems develop. The patient verbalized understanding. The patient was told to call to get lab results if they haven't heard anything in the next week.     This note has been created with Surveyor, quantity. Any transcriptional errors are unintentional.    [name],[name], MD [place], [place]   02/14/2015, 4:04 PM

## 2015-03-18 ENCOUNTER — Other Ambulatory Visit: Payer: Self-pay | Admitting: Internal Medicine

## 2015-03-18 ENCOUNTER — Other Ambulatory Visit: Payer: Self-pay

## 2015-03-18 DIAGNOSIS — Z5181 Encounter for therapeutic drug level monitoring: Secondary | ICD-10-CM

## 2015-03-18 DIAGNOSIS — D571 Sickle-cell disease without crisis: Secondary | ICD-10-CM

## 2015-03-24 ENCOUNTER — Non-Acute Institutional Stay (HOSPITAL_COMMUNITY)
Admission: AD | Admit: 2015-03-24 | Discharge: 2015-03-24 | Disposition: A | Discharge status: Home / Self Care | Payer: BLUE CROSS/BLUE SHIELD | Attending: Internal Medicine | Admitting: Internal Medicine

## 2015-03-24 ENCOUNTER — Telehealth (HOSPITAL_COMMUNITY): Payer: Self-pay | Admitting: *Deleted

## 2015-03-24 ENCOUNTER — Encounter (HOSPITAL_COMMUNITY): Payer: Self-pay

## 2015-03-24 DIAGNOSIS — D5700 Hb-SS disease with crisis, unspecified: Secondary | ICD-10-CM | POA: Insufficient documentation

## 2015-03-24 DIAGNOSIS — E860 Dehydration: Secondary | ICD-10-CM | POA: Diagnosis not present

## 2015-03-24 DIAGNOSIS — Z79899 Other long term (current) drug therapy: Secondary | ICD-10-CM | POA: Diagnosis not present

## 2015-03-24 DIAGNOSIS — D571 Sickle-cell disease without crisis: Secondary | ICD-10-CM

## 2015-03-24 DIAGNOSIS — Z91040 Latex allergy status: Secondary | ICD-10-CM | POA: Diagnosis not present

## 2015-03-24 DIAGNOSIS — Z888 Allergy status to other drugs, medicaments and biological substances status: Secondary | ICD-10-CM | POA: Insufficient documentation

## 2015-03-24 LAB — CBC WITH DIFFERENTIAL/PLATELET
BASOS PCT: 1 % (ref 0–1)
Basophils Absolute: 0 10*3/uL (ref 0.0–0.1)
EOS PCT: 1 % (ref 0–5)
Eosinophils Absolute: 0 10*3/uL (ref 0.0–0.7)
HCT: 22.8 % — ABNORMAL LOW (ref 36.0–46.0)
Hemoglobin: 8.4 g/dL — ABNORMAL LOW (ref 12.0–15.0)
LYMPHS PCT: 34 % (ref 12–46)
Lymphs Abs: 1.4 10*3/uL (ref 0.7–4.0)
MCH: 38.5 pg — ABNORMAL HIGH (ref 26.0–34.0)
MCHC: 36.8 g/dL — ABNORMAL HIGH (ref 30.0–36.0)
MCV: 104.6 fL — ABNORMAL HIGH (ref 78.0–100.0)
MONO ABS: 0.6 10*3/uL (ref 0.1–1.0)
Monocytes Relative: 15 % — ABNORMAL HIGH (ref 3–12)
NEUTROS PCT: 49 % (ref 43–77)
Neutro Abs: 2.1 10*3/uL (ref 1.7–7.7)
PLATELETS: 293 10*3/uL (ref 150–400)
RBC: 2.18 MIL/uL — AB (ref 3.87–5.11)
RDW: 23 % — ABNORMAL HIGH (ref 11.5–15.5)
WBC: 4.1 10*3/uL (ref 4.0–10.5)
nRBC: 6 /100 WBC — ABNORMAL HIGH

## 2015-03-24 LAB — COMPREHENSIVE METABOLIC PANEL
ALT: 32 U/L (ref 14–54)
AST: 62 U/L — AB (ref 15–41)
Albumin: 3.2 g/dL — ABNORMAL LOW (ref 3.5–5.0)
Alkaline Phosphatase: 130 U/L — ABNORMAL HIGH (ref 38–126)
Anion gap: 7 (ref 5–15)
BUN: 6 mg/dL (ref 6–20)
CO2: 24 mmol/L (ref 22–32)
CREATININE: 0.41 mg/dL — AB (ref 0.44–1.00)
Calcium: 8.6 mg/dL — ABNORMAL LOW (ref 8.9–10.3)
Chloride: 106 mmol/L (ref 101–111)
GFR calc Af Amer: >60 mL/min (ref >60)
GFR calc non Af Amer: >60 mL/min (ref >60)
GLUCOSE: 113 mg/dL — AB (ref 65–99)
Potassium: 3.7 mmol/L (ref 3.5–5.1)
Sodium: 137 mmol/L (ref 135–145)
Total Bilirubin: 7.3 mg/dL — ABNORMAL HIGH (ref 0.3–1.2)
Total Protein: 7.8 g/dL (ref 6.5–8.1)

## 2015-03-24 LAB — RETICULOCYTES
RBC.: 2.18 MIL/uL — ABNORMAL LOW (ref 3.87–5.11)
Retic Count, Absolute: 300.8 10*3/uL — ABNORMAL HIGH (ref 19.0–186.0)
Retic Ct Pct: 13.8 % — ABNORMAL HIGH (ref 0.4–3.1)

## 2015-03-24 LAB — LACTATE DEHYDROGENASE: LDH: 253 U/L — AB (ref 98–192)

## 2015-03-24 MED ORDER — KETOROLAC TROMETHAMINE 30 MG/ML IJ SOLN
30.0000 mg | Freq: Four times a day (QID) | INTRAMUSCULAR | Status: DC
  Administered 2015-03-24: 30 mg via INTRAVENOUS
  Filled 2015-03-24: qty 1

## 2015-03-24 MED ORDER — SODIUM CHLORIDE 0.9 % IJ SOLN: 9.0000 mL | INTRAMUSCULAR | Status: DC | PRN

## 2015-03-24 MED ORDER — SODIUM CHLORIDE 0.9 % IV SOLN
12.5000 mg | Freq: Four times a day (QID) | INTRAVENOUS | Status: DC | PRN
  Filled 2015-03-24: qty 0.25

## 2015-03-24 MED ORDER — HYDROMORPHONE 2 MG/ML HIGH CONCENTRATION IV PCA SOLN
INTRAVENOUS | Status: DC
  Administered 2015-03-24: 8.5 mg via INTRAVENOUS
  Administered 2015-03-24: 10:00:00 via INTRAVENOUS
  Filled 2015-03-24: qty 25

## 2015-03-24 MED ORDER — PROMETHAZINE HCL 12.5 MG PO TABS: 12.5000 mg | ORAL_TABLET | Freq: Four times a day (QID) | ORAL | Status: DC | PRN

## 2015-03-24 MED ORDER — FOLIC ACID 1 MG PO TABS: 1.0000 mg | ORAL_TABLET | Freq: Every day | ORAL | Status: DC

## 2015-03-24 MED ORDER — NALOXONE HCL 0.4 MG/ML IJ SOLN: 0.4000 mg | INTRAMUSCULAR | Status: DC | PRN

## 2015-03-24 MED ORDER — POLYETHYLENE GLYCOL 3350 17 G PO PACK: 17.0000 g | PACK | Freq: Every day | ORAL | Status: DC | PRN

## 2015-03-24 MED ORDER — ONDANSETRON HCL 4 MG/2ML IJ SOLN: 4.0000 mg | Freq: Four times a day (QID) | INTRAMUSCULAR | Status: DC | PRN

## 2015-03-24 MED ORDER — DIPHENHYDRAMINE HCL 12.5 MG/5ML PO ELIX: 12.5000 mg | ORAL_SOLUTION | Freq: Four times a day (QID) | ORAL | Status: DC | PRN

## 2015-03-24 MED ORDER — PROMETHAZINE HCL 25 MG PO TABS
25.0000 mg | ORAL_TABLET | Freq: Four times a day (QID) | ORAL | Status: DC | PRN
  Administered 2015-03-24: 25 mg via ORAL
  Filled 2015-03-24: qty 1

## 2015-03-24 MED ORDER — OXYCODONE HCL 10 MG PO TABS: 10.0000 mg | ORAL_TABLET | ORAL | Status: DC | PRN

## 2015-03-24 MED ORDER — SENNOSIDES-DOCUSATE SODIUM 8.6-50 MG PO TABS: 1.0000 | ORAL_TABLET | Freq: Two times a day (BID) | ORAL | Status: DC

## 2015-03-24 MED ORDER — DEXTROSE-NACL 5-0.45 % IV SOLN
INTRAVENOUS | Status: DC
  Administered 2015-03-24: 10:00:00 via INTRAVENOUS

## 2015-03-24 NOTE — H&P (Signed)
Dawn [name] is an 34 y.o. female.   Chief Complaint: Pain in back and legs for 2 days  HPI: patient is a 34 year old female with history of sickle cell disease presented to sickle cell day hospital with pain in her back and legs for 2 days. Pain is rated as 9 out of 10 persistent sharp consistent with her sickle cell painful crisis. She has taken her home medications at home but no relief. It is aggravated with any motion or activity. Not relieved by rest. Patient was to be treated for pain to where she can function  Past Medical History  Diagnosis Date  . Sickle cell disease   . Sickle cell disease, type S   . Blood transfusion   . 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 HCAP (healthcare-associated pneumonia) 05/19/2013  . Sickle cell anemia     Past Surgical History  Procedure Laterality Date  . Cholecystectomy    .  left knee acl reconstruction    . Cesarean section      x 2  . Portacath placement    . Portacath placement Left 02/23/2013    Procedure: INSERTION PORT-A-CATH;  Surgeon: Donato Heinz, MD;  Location: [facility];  Service: General;  Laterality: Left;  . Port-a-cath removal Left 07/29/2013    Procedure: REMOVAL PORT-A-CATH;  Surgeon: Scherry Ran, MD;  Location: [facility];  Service: General;  Laterality: Left;  . Tee without cardioversion N/A 08/07/2013    Procedure: TRANSESOPHAGEAL ECHOCARDIOGRAM (TEE);  Surgeon: Arnoldo Lenis, MD;  Location: [facility];  Service: Cardiology;  Laterality: N/A;    Family History  Problem Relation Age of Onset  . Sickle cell trait Mother   . Sickle cell trait Father   . Hypertension Father   . Diabetes Father   . Sickle cell trait Sister   . Cancer Paternal Aunt     Breast   Social History:  reports that she has never smoked. She has quit using smokeless tobacco. She reports that she does not drink alcohol or use illicit drugs.  Allergies:  Allergies  Allergen Reactions  . Desferal [Deferoxamine] Hives    Local  reaction on arm only during infusion. Can take with benadryl   . Latex Other (See Comments)    REACTION: Pt experiences a burning sensation on contacted skin areas  . Lisinopril Other (See Comments) and Cough    REACTION: Sore/scratchy throat  . Tape Other (See Comments)    REACTION: Pt. Experiences a burning sensation on contacted skin areas    Medications Prior to Admission  Medication Sig Dispense Refill  . folic acid (FOLVITE) 1 MG tablet Take 1 tablet (1 mg total) by mouth daily. 30 tablet 11  . hydroxyurea (HYDREA) 500 MG capsule Take 2 capsules (1,000 mg total) by mouth daily. May take with food to minimize GI side effects. 60 capsule 11  . loratadine (CLARITIN) 10 MG tablet Take 10 mg by mouth daily.    . Oxycodone HCl 10 MG TABS Take 1 tablet (10 mg total) by mouth every 4 (four) hours as needed (moderate pain). 60 tablet 0  . promethazine (PHENERGAN) 12.5 MG tablet Take 1 tablet (12.5 mg total) by mouth every 6 (six) hours as needed for nausea. 12 tablet 0  . Vitamin D, Ergocalciferol, (DRISDOL) 50000 UNITS CAPS capsule Take 1 capsule (50,000 Units total) by mouth every 7 (seven) days. Takes on Sundays. 30 capsule 11    No results found for this or any previous  visit (from the past 48 hour(s)). No results found.  Review of Systems  Constitutional: Negative.   HENT: Negative.   Eyes: Negative.   Respiratory: Negative.   Gastrointestinal: Negative.   Genitourinary: Negative.   Musculoskeletal: Positive for myalgias and back pain. Negative for joint pain, falls and neck pain.  Skin: Negative.   Neurological: Negative.   Endo/Heme/Allergies: Negative.   Psychiatric/Behavioral: Negative.     Blood pressure 128/81, pulse 91, temperature 98.6 F (37 C), temperature source Oral, resp. rate 18, height 5\' 3"  (1.6 m), weight 69.4 kg (153 lb), SpO2 91 %. Physical Exam  Constitutional: She is oriented to person, place, and time. She appears well-developed and well-nourished.   HENT:  Head: Normocephalic and atraumatic.  Right Ear: External ear normal.  Left Ear: External ear normal.  Mouth/Throat: Oropharynx is clear and moist.  Eyes: Conjunctivae and EOM are normal. Pupils are equal, round, and reactive to light.  Neck: Normal range of motion. Neck supple.  Cardiovascular: Normal rate, regular rhythm, normal heart sounds and intact distal pulses.   Respiratory: Effort normal and breath sounds normal.  GI: Soft. Bowel sounds are normal.  Musculoskeletal: Normal range of motion. She exhibits tenderness. She exhibits no edema.  Neurological: She is alert and oriented to person, place, and time. She has normal reflexes.  Skin: Skin is warm and dry.  Psychiatric: She has a normal mood and affect.     Assessment/Plan 34 year old female admitted with sickle cell painful crisis.  #1 sickle cell painful crisis: Patient will be admitted to the day hospital. We will try IV Dilaudid PCA with Toradol and IV hydration. Will try and get her pain down to tolerable limits probably 5 out of 10 prior to discharge home.  #2 sickle cell anemia: We will check H&H and if transfusion is indicated we will transfuse.  #3 dehydration: will hydrate patient aggressively.  Jessyca Sloan,LAWAL 03/24/2015, 10:13 AM

## 2015-03-24 NOTE — Progress Notes (Signed)
Pt a&ox4, pt in no distress. Pt discharged home ambulatory. Pt reports generalized pain at tolerable pain goal 3/10. Pt received all discharge instructions, follow-up instructions, and belongings from bedside. Pt denies any further needs at time of departure.

## 2015-03-25 NOTE — Discharge Summary (Signed)
Physician Discharge Summary  Patient ID: Dawn [name] MRN: [id number] DOB/AGE: 34-Mar-1982 34 y.o.  Admit date: 03/24/2015 Discharge date: 03/25/2015  Admission Diagnoses:  Discharge Diagnoses:  Active Problems:   Sickle cell disease with crisis   Discharged Condition: good  Hospital Course: Patient is admitted with sickle cell painful crisis. Was treated with IV Dilaudid PCA, Toradol as well IVF. Her pain went from 9 out of 10-4 out of 10 which is her goal. She was discharged home on her home medications to follow-up with primary care physician in the next 1-2 weeks. Patient had no home medication for immediate release pain control. I have given her prescription for this month. She'll resume getting the prescription from the PCP next month  Consults: None  Significant Diagnostic Studies: labs: CBC and CMP checked. This seemed to be within her normal range  Treatments: IV hydration and analgesia: Dilaudid  Discharge Exam: Blood pressure 124/58, pulse 73, temperature 97.8 F (36.6 C), temperature source Oral, resp. rate 12, height 5\' 3"  (1.6 m), weight 69.4 kg (153 lb), SpO2 98 %. General appearance: alert, cooperative and no distress Head: Normocephalic, without obvious abnormality, atraumatic Eyes: conjunctivae/corneas clear. PERRL, EOM's intact. Fundi benign. Neck: no adenopathy, no carotid bruit, no JVD, supple, symmetrical, trachea midline and thyroid not enlarged, symmetric, no tenderness/mass/nodules Back: symmetric, no curvature. ROM normal. No CVA tenderness. Resp: clear to auscultation bilaterally Chest wall: no tenderness Cardio: regular rate and rhythm, S1, S2 normal, no murmur, click, rub or gallop GI: soft, non-tender; bowel sounds normal; no masses,  no organomegaly Extremities: extremities normal, atraumatic, no cyanosis or edema Pulses: 2+ and symmetric Skin: Skin color, texture, turgor normal. No rashes or lesions Neurologic: Grossly normal  Disposition: 01-Home or  Self Care     Medication List    TAKE these medications        folic acid 1 MG tablet  Commonly known as:  FOLVITE  Take 1 tablet (1 mg total) by mouth daily.     hydroxyurea 500 MG capsule  Commonly known as:  HYDREA  Take 2 capsules (1,000 mg total) by mouth daily. May take with food to minimize GI side effects.     loratadine 10 MG tablet  Commonly known as:  CLARITIN  Take 10 mg by mouth daily.     Oxycodone HCl 10 MG Tabs  Take 1 tablet (10 mg total) by mouth every 4 (four) hours as needed (moderate pain).     promethazine 12.5 MG tablet  Commonly known as:  PHENERGAN  Take 1 tablet (12.5 mg total) by mouth every 6 (six) hours as needed for nausea.     Vitamin D (Ergocalciferol) 50000 UNITS Caps capsule  Commonly known as:  DRISDOL  Take 1 capsule (50,000 Units total) by mouth every 7 (seven) days. Takes on Sundays.         SignedBarbette Merino 03/24/2015, 6:23 PM

## 2015-03-30 ENCOUNTER — Emergency Department (HOSPITAL_COMMUNITY)
Admission: EM | Admit: 2015-03-30 | Discharge: 2015-03-31 | Disposition: A | Discharge status: Home / Self Care | Payer: BLUE CROSS/BLUE SHIELD | Attending: Emergency Medicine | Admitting: Emergency Medicine

## 2015-03-30 ENCOUNTER — Encounter (HOSPITAL_COMMUNITY): Payer: Self-pay | Admitting: Emergency Medicine

## 2015-03-30 DIAGNOSIS — Z8659 Personal history of other mental and behavioral disorders: Secondary | ICD-10-CM | POA: Insufficient documentation

## 2015-03-30 DIAGNOSIS — Z79899 Other long term (current) drug therapy: Secondary | ICD-10-CM | POA: Insufficient documentation

## 2015-03-30 DIAGNOSIS — Z91040 Latex allergy status: Secondary | ICD-10-CM | POA: Diagnosis not present

## 2015-03-30 DIAGNOSIS — D5700 Hb-SS disease with crisis, unspecified: Secondary | ICD-10-CM | POA: Insufficient documentation

## 2015-03-30 DIAGNOSIS — Z8701 Personal history of pneumonia (recurrent): Secondary | ICD-10-CM | POA: Diagnosis not present

## 2015-03-30 LAB — CBC WITH DIFFERENTIAL/PLATELET
BASOS ABS: 0 10*3/uL (ref 0.0–0.1)
Basophils Relative: 0 % (ref 0–1)
EOS PCT: 1 % (ref 0–5)
Eosinophils Absolute: 0.1 10*3/uL (ref 0.0–0.7)
HEMATOCRIT: 23.6 % — AB (ref 36.0–46.0)
Hemoglobin: 8.6 g/dL — ABNORMAL LOW (ref 12.0–15.0)
Lymphocytes Relative: 35 % (ref 12–46)
Lymphs Abs: 2.4 10*3/uL (ref 0.7–4.0)
MCH: 39.3 pg — ABNORMAL HIGH (ref 26.0–34.0)
MCHC: 36.4 g/dL — AB (ref 30.0–36.0)
MCV: 107.8 fL — ABNORMAL HIGH (ref 78.0–100.0)
MONO ABS: 1.2 10*3/uL — AB (ref 0.1–1.0)
MONOS PCT: 17 % — AB (ref 3–12)
Neutro Abs: 3.2 10*3/uL (ref 1.7–7.7)
Neutrophils Relative %: 47 % (ref 43–77)
Platelets: 211 10*3/uL (ref 150–400)
RBC: 2.19 MIL/uL — ABNORMAL LOW (ref 3.87–5.11)
RDW: 21.5 % — AB (ref 11.5–15.5)
WBC: 6.9 10*3/uL (ref 4.0–10.5)

## 2015-03-30 LAB — COMPREHENSIVE METABOLIC PANEL
ALBUMIN: 3.2 g/dL — AB (ref 3.5–5.0)
ALT: 42 U/L (ref 14–54)
ANION GAP: 5 (ref 5–15)
AST: 80 U/L — ABNORMAL HIGH (ref 15–41)
Alkaline Phosphatase: 150 U/L — ABNORMAL HIGH (ref 38–126)
BUN: 6 mg/dL (ref 6–20)
CALCIUM: 8.3 mg/dL — AB (ref 8.9–10.3)
CO2: 25 mmol/L (ref 22–32)
CREATININE: 0.43 mg/dL — AB (ref 0.44–1.00)
Chloride: 107 mmol/L (ref 101–111)
GFR calc non Af Amer: >60 mL/min (ref >60)
GLUCOSE: 86 mg/dL (ref 65–99)
Potassium: 3.8 mmol/L (ref 3.5–5.1)
Sodium: 137 mmol/L (ref 135–145)
Total Bilirubin: 7.3 mg/dL — ABNORMAL HIGH (ref 0.3–1.2)
Total Protein: 7.6 g/dL (ref 6.5–8.1)

## 2015-03-30 LAB — RETICULOCYTES
RBC.: 2.19 MIL/uL — AB (ref 3.87–5.11)
RETIC COUNT ABSOLUTE: 392 10*3/uL — AB (ref 19.0–186.0)
RETIC CT PCT: 17.9 % — AB (ref 0.4–3.1)

## 2015-03-30 MED ORDER — HYDROMORPHONE HCL 1 MG/ML IJ SOLN
1.0000 mg | Freq: Once | INTRAMUSCULAR | Status: AC
  Administered 2015-03-30: 1 mg via INTRAVENOUS
  Filled 2015-03-30: qty 1

## 2015-03-30 MED ORDER — SODIUM CHLORIDE 0.9 % IV BOLUS (SEPSIS)
1000.0000 mL | Freq: Once | INTRAVENOUS | Status: AC
  Administered 2015-03-30: 1000 mL via INTRAVENOUS

## 2015-03-30 MED ORDER — PROMETHAZINE HCL 25 MG/ML IJ SOLN
12.5000 mg | Freq: Once | INTRAMUSCULAR | Status: AC
  Administered 2015-03-30: 12.5 mg via INTRAVENOUS
  Filled 2015-03-30: qty 1

## 2015-03-30 MED ORDER — PROMETHAZINE HCL 12.5 MG PO TABS
12.5000 mg | ORAL_TABLET | Freq: Once | ORAL | Status: AC
  Administered 2015-03-30: 12.5 mg via ORAL
  Filled 2015-03-30: qty 1

## 2015-03-30 NOTE — Discharge Instructions (Signed)
Take your normal pain medication. Follow-up with your sickle cell doctor.

## 2015-03-30 NOTE — ED Notes (Signed)
Pt reports generalized pain x1 week. Pt reports was at sickle cell clinic last week but was told to come to ED if pain didn't improve.

## 2015-03-30 NOTE — ED Notes (Signed)
Pt updated, comfort measures provided,

## 2015-03-30 NOTE — ED Provider Notes (Signed)
CSN: [id number]     Arrival date & time 03/30/15  1751 History  This chart was scribed for Nat Christen, MD by Julien Nordmann, ED Scribe. This patient was seen in room APA02/AP[facility]02 and the patient's care was started at 9:00 PM.    Chief Complaint  Patient presents with  . Sickle Cell Pain Crisis     HPI HPI Comments: Dawn [name] is a 34 y.o. female who has a hx of sickle cell anemia presents to the Emergency Department complaining of bilateral leg, bilateral arms and lower back pain onset last week. Pt notes being seen at the sickle cell clinic this week and was just given pain medication. Pt has been taking short acting 10 mg of oxycodone to alleviate the pain with minimal relief. She reports the last time she was hospitalized for a pain crisis was in April.   Dr. Zigmund Daniel  Past Medical History  Diagnosis Date  . Sickle cell disease   . Sickle cell disease, type S   . Blood transfusion   . 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 HCAP (healthcare-associated pneumonia) 05/19/2013  . Sickle cell anemia    Past Surgical History  Procedure Laterality Date  . Cholecystectomy    .  left knee acl reconstruction    . Cesarean section      x 2  . Portacath placement    . Portacath placement Left 02/23/2013    Procedure: INSERTION PORT-A-CATH;  Surgeon: Donato Heinz, MD;  Location: [facility];  Service: General;  Laterality: Left;  . Port-a-cath removal Left 07/29/2013    Procedure: REMOVAL PORT-A-CATH;  Surgeon: Scherry Ran, MD;  Location: [facility];  Service: General;  Laterality: Left;  . Tee without cardioversion N/A 08/07/2013    Procedure: TRANSESOPHAGEAL ECHOCARDIOGRAM (TEE);  Surgeon: Arnoldo Lenis, MD;  Location: [facility];  Service: Cardiology;  Laterality: N/A;   Family History  Problem Relation Age of Onset  . Sickle cell trait Mother   . Sickle cell trait Father   . Hypertension Father   . Diabetes Father   . Sickle cell trait Sister   . Cancer Paternal Aunt      Breast   History  Substance Use Topics  . Smoking status: Never Smoker   . Smokeless tobacco: Former Systems developer  . Alcohol Use: No   OB History    Gravida Para Term Preterm AB TAB SAB Ectopic Multiple Living   1 1  1      1      Review of Systems  A complete 10 system review of systems was obtained and all systems are negative except as noted in the HPI and PMH.    Allergies  Desferal; Latex; Lisinopril; and Tape  Home Medications   Prior to Admission medications   Medication Sig Start Date End Date Taking? Authorizing Provider  folic acid (FOLVITE) 1 MG tablet Take 1 tablet (1 mg total) by mouth daily. 03/31/14  Yes [name], FNP  hydroxyurea (HYDREA) 500 MG capsule Take 2 capsules (1,000 mg total) by mouth daily. May take with food to minimize GI side effects. 02/14/15  Yes Leana Gamer, MD  loratadine (CLARITIN) 10 MG tablet Take 10 mg by mouth daily.   Yes Historical Provider, MD  Oxycodone HCl 10 MG TABS Take 1 tablet (10 mg total) by mouth every 4 (four) hours as needed (moderate pain). 03/24/15  Yes Elwyn Reach, MD  promethazine (PHENERGAN) 12.5 MG tablet Take 1 tablet (  12.5 mg total) by mouth every 6 (six) hours as needed for nausea. 03/24/15  Yes Elwyn Reach, MD  Vitamin D, Ergocalciferol, (DRISDOL) 50000 UNITS CAPS capsule Take 1 capsule (50,000 Units total) by mouth every 7 (seven) days. Takes on Sundays. 03/31/14  Yes [name], FNP   Triage vitals: BP 149/90 mmHg  Pulse 88  Temp(Src) 98.6 F (37 C) (Oral)  Resp 20  Ht 5\' 3"  (1.6 m)  Wt 154 lb (69.854 kg)  BMI 27.29 kg/m2  SpO2 100% Physical Exam  Constitutional: She is oriented to person, place, and time. She appears well-developed and well-nourished.  HENT:  Head: Normocephalic and atraumatic.  Eyes: Conjunctivae and EOM are normal. Pupils are equal, round, and reactive to light.  Neck: Normal range of motion. Neck supple.  Cardiovascular: Normal rate and regular rhythm.    Pulmonary/Chest: Effort normal and breath sounds normal.  Abdominal: Soft. Bowel sounds are normal.  Musculoskeletal: Normal range of motion.  Neurological: She is alert and oriented to person, place, and time.  Skin: Skin is warm and dry.  Psychiatric: She has a normal mood and affect. Her behavior is normal.  Nursing note and vitals reviewed.   ED Course  Procedures  DIAGNOSTIC STUDIES: Oxygen Saturation is 100% on RA, normal by my interpretation.  COORDINATION OF CARE:  9:02 PM Discussed treatment plan which includes pain medication, nausea medication, IV fluids with pt at bedside and pt agreed to plan.  Labs Review Labs Reviewed  CBC WITH DIFFERENTIAL/PLATELET - Abnormal; Notable for the following:    RBC 2.19 (*)    Hemoglobin 8.6 (*)    HCT 23.6 (*)    MCV 107.8 (*)    MCH 39.3 (*)    MCHC 36.4 (*)    RDW 21.5 (*)    Monocytes Relative 17 (*)    Monocytes Absolute 1.2 (*)    All other components within normal limits  COMPREHENSIVE METABOLIC PANEL - Abnormal; Notable for the following:    Creatinine, Ser 0.43 (*)    Calcium 8.3 (*)    Albumin 3.2 (*)    AST 80 (*)    Alkaline Phosphatase 150 (*)    Total Bilirubin 7.3 (*)    All other components within normal limits  RETICULOCYTES - Abnormal; Notable for the following:    Retic Ct Pct 17.9 (*)    RBC. 2.19 (*)    Retic Count, Manual 392.0 (*)    All other components within normal limits    Imaging Review No results found.   EKG Interpretation None      MDM   Final diagnoses:  Sickle cell disease with crisis    Patient feels better after IV fluids and pain medicine. She appears nontoxic. She will be discharged.  She has pain meds at home.   I personally performed the services described in this documentation, which was scribed in my presence. The recorded information has been reviewed and is accurate.   Nat Christen, MD 03/30/15 2352

## 2015-05-06 ENCOUNTER — Emergency Department (HOSPITAL_COMMUNITY)
Admission: EM | Admit: 2015-05-06 | Discharge: 2015-05-06 | Disposition: A | Discharge status: Home / Self Care | Payer: BLUE CROSS/BLUE SHIELD | Attending: Emergency Medicine | Admitting: Emergency Medicine

## 2015-05-06 ENCOUNTER — Encounter (HOSPITAL_COMMUNITY): Payer: Self-pay | Admitting: Emergency Medicine

## 2015-05-06 DIAGNOSIS — M79604 Pain in right leg: Secondary | ICD-10-CM | POA: Diagnosis present

## 2015-05-06 DIAGNOSIS — Z91040 Latex allergy status: Secondary | ICD-10-CM | POA: Diagnosis not present

## 2015-05-06 DIAGNOSIS — Z8701 Personal history of pneumonia (recurrent): Secondary | ICD-10-CM | POA: Diagnosis not present

## 2015-05-06 DIAGNOSIS — Z79899 Other long term (current) drug therapy: Secondary | ICD-10-CM | POA: Diagnosis not present

## 2015-05-06 DIAGNOSIS — D5700 Hb-SS disease with crisis, unspecified: Secondary | ICD-10-CM | POA: Insufficient documentation

## 2015-05-06 LAB — BASIC METABOLIC PANEL
ANION GAP: 7 (ref 5–15)
BUN: 6 mg/dL (ref 6–20)
CALCIUM: 8.5 mg/dL — AB (ref 8.9–10.3)
CO2: 25 mmol/L (ref 22–32)
Chloride: 104 mmol/L (ref 101–111)
Creatinine, Ser: 0.58 mg/dL (ref 0.44–1.00)
GFR calc Af Amer: >60 mL/min (ref >60)
GFR calc non Af Amer: >60 mL/min (ref >60)
Glucose, Bld: 92 mg/dL (ref 65–99)
Potassium: 3.5 mmol/L (ref 3.5–5.1)
SODIUM: 136 mmol/L (ref 135–145)

## 2015-05-06 LAB — CBC WITH DIFFERENTIAL/PLATELET
BASOS PCT: 0 % (ref 0–1)
Basophils Absolute: 0 10*3/uL (ref 0.0–0.1)
EOS ABS: 0.1 10*3/uL (ref 0.0–0.7)
Eosinophils Relative: 1 % (ref 0–5)
HEMATOCRIT: 23.7 % — AB (ref 36.0–46.0)
Hemoglobin: 8.7 g/dL — ABNORMAL LOW (ref 12.0–15.0)
LYMPHS PCT: 28 % (ref 12–46)
Lymphs Abs: 1.6 10*3/uL (ref 0.7–4.0)
MCH: 41.8 pg — ABNORMAL HIGH (ref 26.0–34.0)
MCHC: 36.7 g/dL — AB (ref 30.0–36.0)
MCV: 113.9 fL — ABNORMAL HIGH (ref 78.0–100.0)
Monocytes Absolute: 0.9 10*3/uL (ref 0.1–1.0)
Monocytes Relative: 16 % — ABNORMAL HIGH (ref 3–12)
NEUTROS ABS: 3.2 10*3/uL (ref 1.7–7.7)
Neutrophils Relative %: 55 % (ref 43–77)
Platelets: 152 10*3/uL (ref 150–400)
RBC: 2.08 MIL/uL — ABNORMAL LOW (ref 3.87–5.11)
RDW: 17.1 % — ABNORMAL HIGH (ref 11.5–15.5)
WBC: 5.8 10*3/uL (ref 4.0–10.5)

## 2015-05-06 LAB — URINALYSIS, ROUTINE W REFLEX MICROSCOPIC
Glucose, UA: NEGATIVE mg/dL
Ketones, ur: NEGATIVE mg/dL
Leukocytes, UA: NEGATIVE
Nitrite: NEGATIVE
PROTEIN: 100 mg/dL — AB
Specific Gravity, Urine: 1.02 (ref 1.005–1.030)
UROBILINOGEN UA: 2 mg/dL — AB (ref 0.0–1.0)
pH: 6.5 (ref 5.0–8.0)

## 2015-05-06 LAB — URINE MICROSCOPIC-ADD ON

## 2015-05-06 MED ORDER — SODIUM CHLORIDE 0.9 % IV SOLN
INTRAVENOUS | Status: DC
  Administered 2015-05-06: 16:00:00 via INTRAVENOUS

## 2015-05-06 MED ORDER — HYDROMORPHONE HCL 1 MG/ML IJ SOLN
1.0000 mg | Freq: Once | INTRAMUSCULAR | Status: AC
  Administered 2015-05-06: 1 mg via INTRAVENOUS
  Filled 2015-05-06: qty 1

## 2015-05-06 MED ORDER — PROMETHAZINE HCL 25 MG/ML IJ SOLN
12.5000 mg | Freq: Once | INTRAMUSCULAR | Status: AC
  Administered 2015-05-06: 12.5 mg via INTRAVENOUS
  Filled 2015-05-06: qty 1

## 2015-05-06 MED ORDER — HYDROMORPHONE HCL 1 MG/ML IJ SOLN
1.0000 mg | INTRAMUSCULAR | Status: AC | PRN
  Administered 2015-05-06 (×3): 1 mg via INTRAVENOUS
  Filled 2015-05-06 (×3): qty 1

## 2015-05-06 MED ORDER — OXYCODONE HCL 10 MG PO TABS: 10.0000 mg | ORAL_TABLET | ORAL | Status: DC | PRN

## 2015-05-06 NOTE — Discharge Instructions (Signed)
Sickle Cell Anemia, Adult °Sickle cell anemia is a condition in which red blood cells have an abnormal "sickle" shape. This abnormal shape shortens the cells' life span, which results in a lower than normal concentration of red blood cells in the blood. The sickle shape also causes the cells to clump together and block free blood flow through the blood vessels. As a result, the tissues and organs of the body do not receive enough oxygen. Sickle cell anemia causes organ damage and pain and increases the risk of infection. °CAUSES  °Sickle cell anemia is a genetic disorder. Those who receive two copies of the gene have the condition, and those who receive one copy have the trait. °RISK FACTORS °The sickle cell gene is most common in people whose families originated in [place]. Other areas of the globe where sickle cell trait occurs include the [place], [place] and [place], the [place], and the [place].  °SIGNS AND SYMPTOMS °· Pain, especially in the extremities, back, chest, or abdomen (common). The pain may start suddenly or may develop following an illness, especially if there is dehydration. Pain can also occur due to overexertion or exposure to extreme temperature changes. °· Frequent severe bacterial infections, especially certain types of pneumonia and meningitis. °· Pain and swelling in the hands and feet. °· Decreased activity.   °· Loss of appetite.   °· Change in behavior. °· Headaches. °· Seizures. °· Shortness of breath or difficulty breathing. °· Vision changes. °· Skin ulcers. °Those with the trait may not have symptoms or they may have mild symptoms.  °DIAGNOSIS  °Sickle cell anemia is diagnosed with blood tests that demonstrate the genetic trait. It is often diagnosed during the newborn period, due to mandatory testing nationwide. A variety of blood tests, X-rays, CT scans, MRI scans, ultrasounds, and lung function tests may also be done to monitor the condition. °TREATMENT  °Sickle  cell anemia may be treated with: °· Medicines. You may be given pain medicines, antibiotic medicines (to treat and prevent infections) or medicines to increase the production of certain types of hemoglobin. °· Fluids. °· Oxygen. °· Blood transfusions. °HOME CARE INSTRUCTIONS  °· Drink enough fluid to keep your urine clear or pale yellow. Increase your fluid intake in hot weather and during exercise. °· Do not smoke. Smoking lowers oxygen levels in the blood.   °· Only take over-the-counter or prescription medicines for pain, fever, or discomfort as directed by your health care provider. °· Take antibiotics as directed by your health care provider. Make sure you finish them it even if you start to feel better.   °· Take supplements as directed by your health care provider.   °· Consider wearing a medical alert bracelet. This tells anyone caring for you in an emergency of your condition.   °· When traveling, keep your medical information, health care provider's names, and the medicines you take with you at all times.   °· If you develop a fever, do not take medicines to reduce the fever right away. This could cover up a problem that is developing. Notify your health care provider. °· Keep all follow-up appointments with your health care provider. Sickle cell anemia requires regular medical care. °SEEK MEDICAL CARE IF: ° You have a fever. °SEEK IMMEDIATE MEDICAL CARE IF:  °· You feel dizzy or faint.   °· You have new abdominal pain, especially on the left side near the stomach area.   °· You develop a persistent, often uncomfortable and painful penile erection (priapism). If this is not treated immediately it   will lead to impotence.   °· You have numbness your arms or legs or you have a hard time moving them.   °· You have a hard time with speech.   °· You have a fever or persistent symptoms for more than 2-3 days.   °· You have a fever and your symptoms suddenly get worse.   °· You have signs or symptoms of infection.  These include:   °¨ Chills.   °¨ Abnormal tiredness (lethargy).   °¨ Irritability.   °¨ Poor eating.   °¨ Vomiting.   °· You develop pain that is not helped with medicine.   °· You develop shortness of breath. °· You have pain in your chest.   °· You are coughing up pus-like or bloody sputum.   °· You develop a stiff neck. °· Your feet or hands swell or have pain. °· Your abdomen appears bloated. °· You develop joint pain. °MAKE SURE YOU: °· Understand these instructions. °Document Released: 12/19/2005 Document Revised: 01/25/2014 Document Reviewed: 04/22/2013 °[employer]® Patient Information ©2015 [employer]. This information is not intended to replace advice given to you by your health care provider. Make sure you discuss any questions you have with your health care provider. ° °

## 2015-05-06 NOTE — ED Notes (Signed)
Warm pack given to pt per her request to help with left knee pain.

## 2015-05-06 NOTE — ED Notes (Signed)
Pt states that she started to develop pain 2 days ago - has been trying to deal with it but pain worstening. Unable to take much pain meds because of her job, however even those are not helping now

## 2015-05-06 NOTE — ED Provider Notes (Signed)
CSN: [id number]     Arrival date & time 05/06/15  1457 History   First MD Initiated Contact with Patient 05/06/15 1533     Chief Complaint  Patient presents with  . Sickle Cell Pain Crisis     (Consider location/radiation/quality/duration/timing/severity/associated sxs/prior Treatment) HPI   Dawn [name] is a 34 y.o. female who presents for evaluation of bilateral leg pain. She states that this pain is typical of her recurrent sickle cell crisis. She has been hurting for 2 days, and is not getting better using oxycodone rapid release. She denies fever, chills, cough, sinus drainage, shortness of breath, abdominal pain, back pain, change in bowel or urinary habits. No sick contacts. She worked today, but is having trouble managing her pain, because she works. There are no other known modifying factors.  Past Medical History  Diagnosis Date  . Sickle cell disease   . Sickle cell disease, type S   . Blood transfusion   . 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 HCAP (healthcare-associated pneumonia) 05/19/2013  . Sickle cell anemia    Past Surgical History  Procedure Laterality Date  . Cholecystectomy    .  left knee acl reconstruction    . Cesarean section      x 2  . Portacath placement    . Portacath placement Left 02/23/2013    Procedure: INSERTION PORT-A-CATH;  Surgeon: Donato Heinz, MD;  Location: [facility];  Service: General;  Laterality: Left;  . Port-a-cath removal Left 07/29/2013    Procedure: REMOVAL PORT-A-CATH;  Surgeon: Scherry Ran, MD;  Location: [facility];  Service: General;  Laterality: Left;  . Tee without cardioversion N/A 08/07/2013    Procedure: TRANSESOPHAGEAL ECHOCARDIOGRAM (TEE);  Surgeon: Arnoldo Lenis, MD;  Location: [facility];  Service: Cardiology;  Laterality: N/A;   Family History  Problem Relation Age of Onset  . Sickle cell trait Mother   . Sickle cell trait Father   . Hypertension Father   . Diabetes Father   . Sickle cell trait  Sister   . Cancer Paternal Aunt     Breast   Social History  Substance Use Topics  . Smoking status: Never Smoker   . Smokeless tobacco: Former Systems developer  . Alcohol Use: No   OB History    Gravida Para Term Preterm AB TAB SAB Ectopic Multiple Living   1 1  1      1      Review of Systems  All other systems reviewed and are negative.     Allergies  Desferal; Latex; Lisinopril; and Tape  Home Medications   Prior to Admission medications   Medication Sig Start Date End Date Taking? Authorizing Provider  folic acid (FOLVITE) 1 MG tablet Take 1 tablet (1 mg total) by mouth daily. 03/31/14  Yes [name], FNP  hydroxyurea (HYDREA) 500 MG capsule Take 2 capsules (1,000 mg total) by mouth daily. May take with food to minimize GI side effects. 02/14/15  Yes Leana Gamer, MD  loratadine (CLARITIN) 10 MG tablet Take 10 mg by mouth daily.   Yes Historical Provider, MD  promethazine (PHENERGAN) 12.5 MG tablet Take 1 tablet (12.5 mg total) by mouth every 6 (six) hours as needed for nausea. 03/24/15  Yes Elwyn Reach, MD  Vitamin D, Ergocalciferol, (DRISDOL) 50000 UNITS CAPS capsule Take 1 capsule (50,000 Units total) by mouth every 7 (seven) days. Takes on Sundays. 03/31/14  Yes [name], FNP  Oxycodone HCl 10 MG  TABS Take 1 tablet (10 mg total) by mouth every 4 (four) hours as needed for severe pain. 05/06/15   Daleen Bo, MD   BP 117/75 mmHg  Pulse 89  Temp(Src) 98.8 F (37.1 C) (Oral)  Resp 16  Ht 5\' 3"  (1.6 m)  Wt 154 lb (69.854 kg)  BMI 27.29 kg/m2  SpO2 94% Physical Exam  Constitutional: She is oriented to person, place, and time. She appears well-developed and well-nourished. No distress.  HENT:  Head: Normocephalic and atraumatic.  Right Ear: External ear normal.  Left Ear: External ear normal.  Eyes: Conjunctivae and EOM are normal. Pupils are equal, round, and reactive to light.  Neck: Normal range of motion and phonation normal. Neck supple.   Cardiovascular: Normal rate, regular rhythm and normal heart sounds.   Pulmonary/Chest: Effort normal and breath sounds normal. She exhibits no bony tenderness.  Abdominal: Soft. There is no tenderness.  Musculoskeletal: Normal range of motion.  She moves her left leg somewhat slowly secondary to pain, but is able to demonstrate normal range of motion of the hip, knee and ankle. There are no palpable joint effusions of either knee.  Neurological: She is alert and oriented to person, place, and time. No cranial nerve deficit or sensory deficit. She exhibits normal muscle tone. Coordination normal.  Skin: Skin is warm, dry and intact.  Psychiatric: She has a normal mood and affect. Her behavior is normal. Judgment and thought content normal.  Nursing note and vitals reviewed.   ED Course  Procedures (including critical care time) Medications  0.9 %  sodium chloride infusion ( Intravenous New Bag/Given 05/06/15 1602)  HYDROmorphone (DILAUDID) injection 1 mg (1 mg Intravenous Given 05/06/15 1657)  promethazine (PHENERGAN) injection 12.5 mg (12.5 mg Intravenous Given 05/06/15 1657)  HYDROmorphone (DILAUDID) injection 1 mg (1 mg Intravenous Given 05/06/15 2016)    Patient Vitals for the past 24 hrs:  BP Temp Temp src Pulse Resp SpO2 Height Weight  05/06/15 2027 117/75 mmHg - - 89 16 94 % - -  05/06/15 1830 125/76 mmHg - - 90 - 95 % - -  05/06/15 1800 133/84 mmHg - - 84 - 95 % - -  05/06/15 1744 130/75 mmHg - - 85 18 98 % - -  05/06/15 1730 130/75 mmHg - - 83 - 95 % - -  05/06/15 1700 124/76 mmHg - - 95 - 96 % - -  05/06/15 1630 134/82 mmHg - - 85 - 96 % - -  05/06/15 1603 125/79 mmHg - - 87 - 96 % - -  05/06/15 1513 137/78 mmHg 98.8 F (37.1 C) Oral 94 20 97 % 5\' 3"  (1.6 m) 154 lb (69.854 kg)    8:25 PM Reevaluation with update and discussion. After initial assessment and treatment, an updated evaluation reveals her pain is controlled. She has only 2 Roxicet at home at this time. Findings  discussed with the patient, all questions answered.Daleen Bo L     Labs Review Labs Reviewed  BASIC METABOLIC PANEL - Abnormal; Notable for the following:    Calcium 8.5 (*)    All other components within normal limits  CBC WITH DIFFERENTIAL/PLATELET - Abnormal; Notable for the following:    RBC 2.08 (*)    Hemoglobin 8.7 (*)    HCT 23.7 (*)    MCV 113.9 (*)    MCH 41.8 (*)    MCHC 36.7 (*)    RDW 17.1 (*)    Monocytes Relative 16 (*)  All other components within normal limits  URINALYSIS, ROUTINE W REFLEX MICROSCOPIC (NOT AT [facility]) - Abnormal; Notable for the following:    Color, Urine AMBER (*)    Hgb urine dipstick LARGE (*)    Bilirubin Urine SMALL (*)    Protein, ur 100 (*)    Urobilinogen, UA 2.0 (*)    All other components within normal limits  URINE MICROSCOPIC-ADD ON - Abnormal; Notable for the following:    Squamous Epithelial / LPF FEW (*)    Bacteria, UA FEW (*)    All other components within normal limits    Imaging Review No results found.    EKG Interpretation None         MDM   Final diagnoses:  Sickle cell crisis   Sickle cell crisis, without significant blood loss. No evidence for ongoing source for symptoms. Improved after treatment here with parenteral medication and fluids. Doubt sepsis, severe bacterial infection or metabolic instability.  Nursing Notes Reviewed/ Care Coordinated Applicable Imaging Reviewed Interpretation of Laboratory Data incorporated into ED treatment  The patient appears reasonably screened and/or stabilized for discharge and I doubt any other medical condition or other [facility] requiring further screening, evaluation, or treatment in the ED at this time prior to discharge.  Plan: Home Medications- Roxicodone ; Home Treatments- rest; return here if the recommended treatment, does not improve the symptoms; Recommended follow up- PCP prn       Daleen Bo, MD 05/06/15 2049

## 2015-05-06 NOTE — ED Notes (Signed)
Pt given Coke and Saltine crackers per pt request.

## 2015-05-22 ENCOUNTER — Emergency Department (HOSPITAL_COMMUNITY)
Admission: EM | Admit: 2015-05-22 | Discharge: 2015-05-22 | Disposition: A | Discharge status: Home / Self Care | Payer: BLUE CROSS/BLUE SHIELD | Attending: Emergency Medicine | Admitting: Emergency Medicine

## 2015-05-22 ENCOUNTER — Encounter (HOSPITAL_COMMUNITY): Payer: Self-pay | Admitting: Emergency Medicine

## 2015-05-22 DIAGNOSIS — Z8659 Personal history of other mental and behavioral disorders: Secondary | ICD-10-CM | POA: Insufficient documentation

## 2015-05-22 DIAGNOSIS — Z8701 Personal history of pneumonia (recurrent): Secondary | ICD-10-CM | POA: Insufficient documentation

## 2015-05-22 DIAGNOSIS — Z79899 Other long term (current) drug therapy: Secondary | ICD-10-CM | POA: Insufficient documentation

## 2015-05-22 DIAGNOSIS — Z91040 Latex allergy status: Secondary | ICD-10-CM | POA: Insufficient documentation

## 2015-05-22 DIAGNOSIS — D5700 Hb-SS disease with crisis, unspecified: Secondary | ICD-10-CM | POA: Diagnosis present

## 2015-05-22 LAB — COMPREHENSIVE METABOLIC PANEL
ALBUMIN: 3.1 g/dL — AB (ref 3.5–5.0)
ALK PHOS: 150 U/L — AB (ref 38–126)
ALT: 34 U/L (ref 14–54)
AST: 73 U/L — ABNORMAL HIGH (ref 15–41)
Anion gap: 5 (ref 5–15)
BILIRUBIN TOTAL: 6.6 mg/dL — AB (ref 0.3–1.2)
BUN: 7 mg/dL (ref 6–20)
CALCIUM: 8.1 mg/dL — AB (ref 8.9–10.3)
CO2: 23 mmol/L (ref 22–32)
Chloride: 107 mmol/L (ref 101–111)
Creatinine, Ser: 0.33 mg/dL — ABNORMAL LOW (ref 0.44–1.00)
GFR calc Af Amer: >60 mL/min (ref >60)
GFR calc non Af Amer: >60 mL/min (ref >60)
GLUCOSE: 80 mg/dL (ref 65–99)
Potassium: 3.7 mmol/L (ref 3.5–5.1)
Sodium: 135 mmol/L (ref 135–145)
TOTAL PROTEIN: 7.6 g/dL (ref 6.5–8.1)

## 2015-05-22 LAB — CBC WITH DIFFERENTIAL/PLATELET
BASOS PCT: 0 % (ref 0–1)
Basophils Absolute: 0 10*3/uL (ref 0.0–0.1)
Eosinophils Absolute: 0.1 10*3/uL (ref 0.0–0.7)
Eosinophils Relative: 1 % (ref 0–5)
HCT: 20.6 % — ABNORMAL LOW (ref 36.0–46.0)
HEMOGLOBIN: 7.5 g/dL — AB (ref 12.0–15.0)
Lymphocytes Relative: 40 % (ref 12–46)
Lymphs Abs: 2.1 10*3/uL (ref 0.7–4.0)
MCH: 40.1 pg — ABNORMAL HIGH (ref 26.0–34.0)
MCHC: 36.4 g/dL — AB (ref 30.0–36.0)
MCV: 110.2 fL — ABNORMAL HIGH (ref 78.0–100.0)
MONOS PCT: 17 % — AB (ref 3–12)
Monocytes Absolute: 0.9 10*3/uL (ref 0.1–1.0)
NEUTROS ABS: 2.3 10*3/uL (ref 1.7–7.7)
Neutrophils Relative %: 42 % — ABNORMAL LOW (ref 43–77)
Platelets: 230 10*3/uL (ref 150–400)
RBC: 1.87 MIL/uL — AB (ref 3.87–5.11)
RDW: 16.2 % — ABNORMAL HIGH (ref 11.5–15.5)
WBC: 5.7 10*3/uL (ref 4.0–10.5)

## 2015-05-22 LAB — RETICULOCYTES
RBC.: 1.87 MIL/uL — ABNORMAL LOW (ref 3.87–5.11)
Retic Count, Absolute: 402.1 10*3/uL — ABNORMAL HIGH (ref 19.0–186.0)
Retic Ct Pct: 21.5 % — ABNORMAL HIGH (ref 0.4–3.1)

## 2015-05-22 MED ORDER — HYDROMORPHONE HCL 2 MG/ML IJ SOLN
INTRAMUSCULAR | Status: AC
  Filled 2015-05-22: qty 1

## 2015-05-22 MED ORDER — HYDROMORPHONE HCL 2 MG/ML IJ SOLN
2.0000 mg | Freq: Once | INTRAMUSCULAR | Status: AC
  Administered 2015-05-22: 2 mg via INTRAVENOUS

## 2015-05-22 MED ORDER — PROMETHAZINE HCL 25 MG/ML IJ SOLN
25.0000 mg | Freq: Once | INTRAMUSCULAR | Status: AC
  Administered 2015-05-22: 25 mg via INTRAVENOUS
  Filled 2015-05-22: qty 1

## 2015-05-22 MED ORDER — HYDROMORPHONE HCL 2 MG/ML IJ SOLN
2.0000 mg | Freq: Once | INTRAMUSCULAR | Status: AC
  Administered 2015-05-22: 2 mg via INTRAVENOUS
  Filled 2015-05-22: qty 1

## 2015-05-22 MED ORDER — DIPHENHYDRAMINE HCL 50 MG/ML IJ SOLN
INTRAMUSCULAR | Status: AC
  Filled 2015-05-22: qty 1

## 2015-05-22 MED ORDER — KETOROLAC TROMETHAMINE 30 MG/ML IJ SOLN
30.0000 mg | Freq: Once | INTRAMUSCULAR | Status: AC
  Administered 2015-05-22: 30 mg via INTRAVENOUS
  Filled 2015-05-22: qty 1

## 2015-05-22 MED ORDER — DIPHENHYDRAMINE HCL 50 MG/ML IJ SOLN
25.0000 mg | Freq: Once | INTRAMUSCULAR | Status: AC
  Administered 2015-05-22: 25 mg via INTRAVENOUS

## 2015-05-22 MED ORDER — SODIUM CHLORIDE 0.9 % IV SOLN
Freq: Once | INTRAVENOUS | Status: AC
  Administered 2015-05-22: 15:00:00 via INTRAVENOUS

## 2015-05-22 NOTE — ED Notes (Signed)
Pollina informed of unchanged pain level

## 2015-05-22 NOTE — Discharge Instructions (Signed)
Sickle Cell Anemia, Adult °Sickle cell anemia is a condition in which red blood cells have an abnormal "sickle" shape. This abnormal shape shortens the cells' life span, which results in a lower than normal concentration of red blood cells in the blood. The sickle shape also causes the cells to clump together and block free blood flow through the blood vessels. As a result, the tissues and organs of the body do not receive enough oxygen. Sickle cell anemia causes organ damage and pain and increases the risk of infection. °CAUSES  °Sickle cell anemia is a genetic disorder. Those who receive two copies of the gene have the condition, and those who receive one copy have the trait. °RISK FACTORS °The sickle cell gene is most common in people whose families originated in [place]. Other areas of the globe where sickle cell trait occurs include the [place], [place] and [place], the [place], and the [place].  °SIGNS AND SYMPTOMS °· Pain, especially in the extremities, back, chest, or abdomen (common). The pain may start suddenly or may develop following an illness, especially if there is dehydration. Pain can also occur due to overexertion or exposure to extreme temperature changes. °· Frequent severe bacterial infections, especially certain types of pneumonia and meningitis. °· Pain and swelling in the hands and feet. °· Decreased activity.   °· Loss of appetite.   °· Change in behavior. °· Headaches. °· Seizures. °· Shortness of breath or difficulty breathing. °· Vision changes. °· Skin ulcers. °Those with the trait may not have symptoms or they may have mild symptoms.  °DIAGNOSIS  °Sickle cell anemia is diagnosed with blood tests that demonstrate the genetic trait. It is often diagnosed during the newborn period, due to mandatory testing nationwide. A variety of blood tests, X-rays, CT scans, MRI scans, ultrasounds, and lung function tests may also be done to monitor the condition. °TREATMENT  °Sickle  cell anemia may be treated with: °· Medicines. You may be given pain medicines, antibiotic medicines (to treat and prevent infections) or medicines to increase the production of certain types of hemoglobin. °· Fluids. °· Oxygen. °· Blood transfusions. °HOME CARE INSTRUCTIONS  °· Drink enough fluid to keep your urine clear or pale yellow. Increase your fluid intake in hot weather and during exercise. °· Do not smoke. Smoking lowers oxygen levels in the blood.   °· Only take over-the-counter or prescription medicines for pain, fever, or discomfort as directed by your health care provider. °· Take antibiotics as directed by your health care provider. Make sure you finish them it even if you start to feel better.   °· Take supplements as directed by your health care provider.   °· Consider wearing a medical alert bracelet. This tells anyone caring for you in an emergency of your condition.   °· When traveling, keep your medical information, health care provider's names, and the medicines you take with you at all times.   °· If you develop a fever, do not take medicines to reduce the fever right away. This could cover up a problem that is developing. Notify your health care provider. °· Keep all follow-up appointments with your health care provider. Sickle cell anemia requires regular medical care. °SEEK MEDICAL CARE IF: ° You have a fever. °SEEK IMMEDIATE MEDICAL CARE IF:  °· You feel dizzy or faint.   °· You have new abdominal pain, especially on the left side near the stomach area.   °· You develop a persistent, often uncomfortable and painful penile erection (priapism). If this is not treated immediately it   will lead to impotence.   °· You have numbness your arms or legs or you have a hard time moving them.   °· You have a hard time with speech.   °· You have a fever or persistent symptoms for more than 2-3 days.   °· You have a fever and your symptoms suddenly get worse.   °· You have signs or symptoms of infection.  These include:   °¨ Chills.   °¨ Abnormal tiredness (lethargy).   °¨ Irritability.   °¨ Poor eating.   °¨ Vomiting.   °· You develop pain that is not helped with medicine.   °· You develop shortness of breath. °· You have pain in your chest.   °· You are coughing up pus-like or bloody sputum.   °· You develop a stiff neck. °· Your feet or hands swell or have pain. °· Your abdomen appears bloated. °· You develop joint pain. °MAKE SURE YOU: °· Understand these instructions. °Document Released: 12/19/2005 Document Revised: 01/25/2014 Document Reviewed: 04/22/2013 °[employer]® Patient Information ©2015 [employer]. This information is not intended to replace advice given to you by your health care provider. Make sure you discuss any questions you have with your health care provider. ° °

## 2015-05-22 NOTE — ED Notes (Signed)
Pt c/o sickle cell pain x 2 days. States pain is mostly in her legs and arms. Pt states she has been trying to manage pain at home, but it has become increasingly worse.

## 2015-05-22 NOTE — ED Provider Notes (Signed)
CSN: [id number]     Arrival date & time 05/22/15  1414 History   First MD Initiated Contact with Patient 05/22/15 1423     Chief Complaint  Patient presents with  . Sickle Cell Pain Crisis     (Consider location/radiation/quality/duration/timing/severity/associated sxs/prior Treatment) HPI Comments: Patient presents to the emergency department for evaluation of sickle cell crisis. Patient reports that she has been experiencing increased pain for 2 days. Patient reports that she is having severe pains in her forearms, lower back and legs. This is typical for her sickle cell crisis. Pain is severe, not responding to her oxycodone. She has not had any fever, chills. There is no chest pain, cough, shortness of breath. No headache, numbness, tingling, weakness or abdominal discomfort.  Patient is a 34 y.o. female presenting with sickle cell pain.  Sickle Cell Pain Crisis   Past Medical History  Diagnosis Date  . Sickle cell disease   . Sickle cell disease, type S   . Blood transfusion   . 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 HCAP (healthcare-associated pneumonia) 05/19/2013  . Sickle cell anemia    Past Surgical History  Procedure Laterality Date  . Cholecystectomy    .  left knee acl reconstruction    . Cesarean section      x 2  . Portacath placement    . Portacath placement Left 02/23/2013    Procedure: INSERTION PORT-A-CATH;  Surgeon: Donato Heinz, MD;  Location: [facility];  Service: General;  Laterality: Left;  . Port-a-cath removal Left 07/29/2013    Procedure: REMOVAL PORT-A-CATH;  Surgeon: Scherry Ran, MD;  Location: [facility];  Service: General;  Laterality: Left;  . Tee without cardioversion N/A 08/07/2013    Procedure: TRANSESOPHAGEAL ECHOCARDIOGRAM (TEE);  Surgeon: Arnoldo Lenis, MD;  Location: [facility];  Service: Cardiology;  Laterality: N/A;   Family History  Problem Relation Age of Onset  . Sickle cell trait Mother   . Sickle cell trait Father    . Hypertension Father   . Diabetes Father   . Sickle cell trait Sister   . Cancer Paternal Aunt     Breast   Social History  Substance Use Topics  . Smoking status: Never Smoker   . Smokeless tobacco: Former Systems developer  . Alcohol Use: No   OB History    Gravida Para Term Preterm AB TAB SAB Ectopic Multiple Living   1 1  1      1      Review of Systems  Musculoskeletal: Positive for arthralgias.  All other systems reviewed and are negative.     Allergies  Desferal; Latex; Lisinopril; and Tape  Home Medications   Prior to Admission medications   Medication Sig Start Date End Date Taking? Authorizing Provider  cetirizine (ZYRTEC) 10 MG tablet TAKE 1 TABLET (10 MG TOTAL) BY MOUTH DAILY. 05/06/15  Yes Historical Provider, MD  folic acid (FOLVITE) 1 MG tablet Take 1 tablet (1 mg total) by mouth daily. 03/31/14  Yes [name], FNP  hydroxyurea (HYDREA) 500 MG capsule Take 2 capsules (1,000 mg total) by mouth daily. May take with food to minimize GI side effects. 02/14/15  Yes Leana Gamer, MD  loratadine (CLARITIN) 10 MG tablet Take 10 mg by mouth daily.   Yes Historical Provider, MD  Oxycodone HCl 10 MG TABS Take 1 tablet (10 mg total) by mouth every 4 (four) hours as needed for severe pain. 05/06/15  Yes Daleen Bo, MD  promethazine (  PHENERGAN) 12.5 MG tablet Take 1 tablet (12.5 mg total) by mouth every 6 (six) hours as needed for nausea. 03/24/15  Yes Elwyn Reach, MD  Vitamin D, Ergocalciferol, (DRISDOL) 50000 UNITS CAPS capsule Take 1 capsule (50,000 Units total) by mouth every 7 (seven) days. Takes on Sundays. 03/31/14  Yes [name], FNP   BP 123/76 mmHg  Pulse 83  Temp(Src) 98.7 F (37.1 C) (Oral)  Resp 16  Ht 5\' 3"  (1.6 m)  Wt 156 lb (70.761 kg)  BMI 27.64 kg/m2  SpO2 99% Physical Exam  Constitutional: She is oriented to person, place, and time. She appears well-developed and well-nourished. No distress.  HENT:  Head: Normocephalic and atraumatic.   Right Ear: Hearing normal.  Left Ear: Hearing normal.  Nose: Nose normal.  Mouth/Throat: Oropharynx is clear and moist and mucous membranes are normal.  Eyes: Conjunctivae and EOM are normal. Pupils are equal, round, and reactive to light.  Neck: Normal range of motion. Neck supple.  Cardiovascular: Regular rhythm, S1 normal and S2 normal.  Exam reveals no gallop and no friction rub.   No murmur heard. Pulmonary/Chest: Effort normal and breath sounds normal. No respiratory distress. She exhibits no tenderness.  Abdominal: Soft. Normal appearance and bowel sounds are normal. There is no hepatosplenomegaly. There is no tenderness. There is no rebound, no guarding, no tenderness at McBurney's point and negative Murphy's sign. No hernia.  Musculoskeletal: Normal range of motion.  Neurological: She is alert and oriented to person, place, and time. She has normal strength. No cranial nerve deficit or sensory deficit. Coordination normal. GCS eye subscore is 4. GCS verbal subscore is 5. GCS motor subscore is 6.  Skin: Skin is warm, dry and intact. No rash noted. No cyanosis.  Psychiatric: She has a normal mood and affect. Her speech is normal and behavior is normal. Thought content normal.  Nursing note and vitals reviewed.   ED Course  Procedures (including critical care time) Labs Review Labs Reviewed  CBC WITH DIFFERENTIAL/PLATELET - Abnormal; Notable for the following:    RBC 1.87 (*)    Hemoglobin 7.5 (*)    HCT 20.6 (*)    MCV 110.2 (*)    MCH 40.1 (*)    MCHC 36.4 (*)    RDW 16.2 (*)    Neutrophils Relative % 42 (*)    Monocytes Relative 17 (*)    All other components within normal limits  COMPREHENSIVE METABOLIC PANEL - Abnormal; Notable for the following:    Creatinine, Ser 0.33 (*)    Calcium 8.1 (*)    Albumin 3.1 (*)    AST 73 (*)    Alkaline Phosphatase 150 (*)    Total Bilirubin 6.6 (*)    All other components within normal limits  RETICULOCYTES - Abnormal; Notable  for the following:    Retic Ct Pct 21.5 (*)    RBC. 1.87 (*)    Retic Count, Manual 402.1 (*)    All other components within normal limits  URINALYSIS, ROUTINE W REFLEX MICROSCOPIC (NOT AT [facility])    Imaging Review No results found. I have personally reviewed and evaluated these images and lab results as part of my medical decision-making.   EKG Interpretation None      MDM   Final diagnoses:  Sickle cell crisis    Patient presents to the emergency department for evaluation of sickle cell crisis. Patient reports 2 day history of pain in her lower back, arms and legs. This is  typical of her sickle cell crisis. She does not have any fever, chest pain, shortness of breath. No concern for acute chest. She has normal neurologic exam. Patient was hydrated, administered IV analgesia. She has improved with each dose. Patient feels she has had improved pain control and can be discharged, continue her oral medications and follow-up with her doctor as an outpatient.    Orpah Greek, MD 05/22/15 [phone]

## 2015-05-23 ENCOUNTER — Encounter (HOSPITAL_COMMUNITY): Payer: Self-pay | Admitting: *Deleted

## 2015-05-23 ENCOUNTER — Non-Acute Institutional Stay (HOSPITAL_COMMUNITY)
Admission: AD | Admit: 2015-05-23 | Discharge: 2015-05-23 | Disposition: A | Discharge status: Home / Self Care | Payer: BLUE CROSS/BLUE SHIELD | Attending: Internal Medicine | Admitting: Internal Medicine

## 2015-05-23 ENCOUNTER — Other Ambulatory Visit: Payer: Self-pay

## 2015-05-23 ENCOUNTER — Other Ambulatory Visit: Payer: Self-pay | Admitting: Family Medicine

## 2015-05-23 ENCOUNTER — Telehealth (HOSPITAL_COMMUNITY): Payer: Self-pay | Admitting: *Deleted

## 2015-05-23 DIAGNOSIS — D571 Sickle-cell disease without crisis: Secondary | ICD-10-CM

## 2015-05-23 DIAGNOSIS — Z79899 Other long term (current) drug therapy: Secondary | ICD-10-CM | POA: Diagnosis not present

## 2015-05-23 DIAGNOSIS — D5700 Hb-SS disease with crisis, unspecified: Secondary | ICD-10-CM | POA: Insufficient documentation

## 2015-05-23 LAB — LACTATE DEHYDROGENASE: LDH: 361 U/L — ABNORMAL HIGH (ref 98–192)

## 2015-05-23 MED ORDER — DIPHENHYDRAMINE HCL 12.5 MG/5ML PO ELIX: 12.5000 mg | ORAL_SOLUTION | Freq: Four times a day (QID) | ORAL | Status: DC | PRN

## 2015-05-23 MED ORDER — SODIUM CHLORIDE 0.9 % IV SOLN
12.5000 mg | Freq: Four times a day (QID) | INTRAVENOUS | Status: DC | PRN
  Filled 2015-05-23: qty 0.25

## 2015-05-23 MED ORDER — SODIUM CHLORIDE 0.9 % IJ SOLN: 9.0000 mL | INTRAMUSCULAR | Status: DC | PRN

## 2015-05-23 MED ORDER — OXYCODONE HCL 10 MG PO TABS: 10.0000 mg | ORAL_TABLET | ORAL | Status: DC | PRN

## 2015-05-23 MED ORDER — OXYCODONE-ACETAMINOPHEN 5-325 MG PO TABS: 2.0000 | ORAL_TABLET | Freq: Once | ORAL | Status: DC

## 2015-05-23 MED ORDER — HYDROMORPHONE 2 MG/ML HIGH CONCENTRATION IV PCA SOLN
INTRAVENOUS | Status: DC
  Administered 2015-05-23: 12:00:00 via INTRAVENOUS
  Filled 2015-05-23: qty 25

## 2015-05-23 MED ORDER — POLYETHYLENE GLYCOL 3350 17 G PO PACK
17.0000 g | PACK | Freq: Every day | ORAL | Status: DC | PRN
Start: 2015-05-23 — End: 2015-05-23

## 2015-05-23 MED ORDER — DEXTROSE-NACL 5-0.45 % IV SOLN
INTRAVENOUS | Status: DC
  Administered 2015-05-23: 12:00:00 via INTRAVENOUS

## 2015-05-23 MED ORDER — HYDROMORPHONE HCL 2 MG/ML IJ SOLN
1.0000 mg | Freq: Once | INTRAMUSCULAR | Status: AC
  Administered 2015-05-23: 1 mg via SUBCUTANEOUS
  Filled 2015-05-23: qty 1

## 2015-05-23 MED ORDER — KETOROLAC TROMETHAMINE 30 MG/ML IJ SOLN
30.0000 mg | Freq: Four times a day (QID) | INTRAMUSCULAR | Status: DC
  Administered 2015-05-23: 30 mg via INTRAVENOUS
  Filled 2015-05-23: qty 1

## 2015-05-23 MED ORDER — PROMETHAZINE HCL 12.5 MG PO TABS: 12.5000 mg | ORAL_TABLET | Freq: Four times a day (QID) | ORAL | Status: DC | PRN

## 2015-05-23 MED ORDER — ONDANSETRON HCL 4 MG/2ML IJ SOLN
4.0000 mg | Freq: Four times a day (QID) | INTRAMUSCULAR | Status: DC | PRN
  Administered 2015-05-23: 4 mg via INTRAVENOUS
  Filled 2015-05-23: qty 2

## 2015-05-23 MED ORDER — FOLIC ACID 1 MG PO TABS: 1.0000 mg | ORAL_TABLET | Freq: Every day | ORAL | Status: DC

## 2015-05-23 MED ORDER — NALOXONE HCL 0.4 MG/ML IJ SOLN: 0.4000 mg | INTRAMUSCULAR | Status: DC | PRN

## 2015-05-23 MED ORDER — SENNOSIDES-DOCUSATE SODIUM 8.6-50 MG PO TABS: 1.0000 | ORAL_TABLET | Freq: Two times a day (BID) | ORAL | Status: DC

## 2015-05-23 NOTE — Discharge Summary (Signed)
PCP: [name],[name], MD  Admit date: 05/23/2015 Discharge date: 05/23/2015  Time spent: 15 minutes   Discharge Diagnoses:  Active Problems:   Sickle cell anemia with crisis   Discharge Condition: Stable Diet recommendation: Regular  Filed Weights   05/23/15 0953  Weight: 156 lb (70.761 kg)    History of present illness:   Dawn [name] is a 34 y.o. female with history of sickle cell disease, who presents for painful crisis starting on Thursday, 3 days ago and has not been managaged by home medications. She reports nothing is different about this episode and her pain is 8/10. She had her last oxycodone at 8 pm last night. Her pain is in her arms, legs and back   Hospital Course:  Patient was admitted to the day hospital with sickle cell painful crisis. She was treated with IV Dilaudid PCA, Toradol as well as IV fluids. She improved symptomatically and her pain went down from 8 out of 10 down to 2 out of 10 at the time of discharge. She is to follow-up in the clinic as previously scheduled. And she is to continue with her home medications as per prior to admission.  Discharge Exam: Filed Vitals:   05/23/15 1230  BP: 132/70  Pulse: 82  Temp: 98.1 F (36.7 C)  Resp: 22    General appearance: alert, cooperative and no distress Eyes: conjunctivae/corneas clear. PERRL, EOM's intact. Fundi benign. Neck: no adenopathy, no carotid bruit, no JVD, supple, symmetrical, trachea midline and thyroid not enlarged, symmetric, no tenderness/mass/nodules Back: symmetric, no curvature. ROM normal. No CVA tenderness. Resp: clear to auscultation bilaterally Chest wall: no tenderness Cardio: regular rate and rhythm, S1, S2 normal, no murmur, click, rub or gallop GI: soft, non-tender; bowel sounds normal; no masses, no organomegaly Extremities: extremities normal, atraumatic, no cyanosis or edema Pulses: 2+ and symmetric Skin: Skin color, texture, turgor normal. No rashes or  lesions Neurologic: Grossly normal  Discharge Instructions  Home to self-care with previous medications Follow-up in clinic in 2-3 days.    Current Discharge Medication List    CONTINUE these medications which have NOT CHANGED   Details  cetirizine (ZYRTEC) 10 MG tablet TAKE 1 TABLET (10 MG TOTAL) BY MOUTH DAILY. Refills: 3    folic acid (FOLVITE) 1 MG tablet Take 1 tablet (1 mg total) by mouth daily. Qty: 30 tablet, Refills: 11    hydroxyurea (HYDREA) 500 MG capsule Take 2 capsules (1,000 mg total) by mouth daily. May take with food to minimize GI side effects. Qty: 60 capsule, Refills: 11   Associated Diagnoses: Hb-SS disease without crisis    Oxycodone HCl 10 MG TABS Take 1 tablet (10 mg total) by mouth every 4 (four) hours as needed for severe pain. Qty: 20 tablet, Refills: 0    promethazine (PHENERGAN) 12.5 MG tablet Take 1 tablet (12.5 mg total) by mouth every 6 (six) hours as needed for nausea. Qty: 12 tablet, Refills: 0   Associated Diagnoses: Hb-SS disease without crisis    Vitamin D, Ergocalciferol, (DRISDOL) 50000 UNITS CAPS capsule Take 1 capsule (50,000 Units total) by mouth every 7 (seven) days. Takes on Sundays. Qty: 30 capsule, Refills: 11   Associated Diagnoses: Vitamin D deficiency    loratadine (CLARITIN) 10 MG tablet Take 10 mg by mouth daily.       Allergies  Allergen Reactions  . Desferal [Deferoxamine] Hives    Local reaction on arm only during infusion. Can take with benadryl   .  Latex Other (See Comments)    REACTION: Pt experiences a burning sensation on contacted skin areas  . Lisinopril Other (See Comments) and Cough    REACTION: Sore/scratchy throat  . Tape Other (See Comments)    REACTION: Pt. Experiences a burning sensation on contacted skin areas      The results of significant diagnostics from this hospitalization (including imaging, microbiology, ancillary and laboratory) are listed below for reference.    Significant Diagnostic  Studies: No results found.  Microbiology: No results found for this or any previous visit (from the past 240 hour(s)).   Labs: Basic Metabolic Panel:  Recent Labs Lab 05/22/15 1504  NA 135  K 3.7  CL 107  CO2 23  GLUCOSE 80  BUN 7  CREATININE 0.33*  CALCIUM 8.1*   Liver Function Tests:  Recent Labs Lab 05/22/15 1504  AST 73*  ALT 34  ALKPHOS 150*  BILITOT 6.6*  PROT 7.6  ALBUMIN 3.1*   No results for input(s): LIPASE, AMYLASE in the last 168 hours. No results for input(s): AMMONIA in the last 168 hours. CBC:  Recent Labs Lab 05/22/15 1504  WBC 5.7  NEUTROABS 2.3  HGB 7.5*  HCT 20.6*  MCV 110.2*  PLT 230   Cardiac Enzymes: No results for input(s): CKTOTAL, CKMB, CKMBINDEX, TROPONINI in the last 168 hours. BNP: BNP (last 3 results) No results for input(s): BNP in the last 8760 hours.  ProBNP (last 3 results) No results for input(s): PROBNP in the last 8760 hours.  CBG: No results for input(s): GLUCAP in the last 168 hours.     Signed:  Sharon Seller MSN,FNP,BC

## 2015-05-23 NOTE — Progress Notes (Signed)
Pt alert and oriented. States pain is now a 4, coming down from an 8 out of 10. IV fluids d/c'd. Pt given prescription for pain and nausea medicine. Encourage to take pain meds and drink plenty of fluids at home.

## 2015-05-23 NOTE — Progress Notes (Signed)
Pt awaiting IV placement by IV team; NP notified; order received for one time dose of subq dilaudid for pain; will continue to monitor

## 2015-05-23 NOTE — H&P (Signed)
[facility] History and Physical  RAPHAEL ESPE YQI:[id number] DOB: 06-Mar-1981 DOA: 05/23/2015  PCP: [name],[name], MD   Chief Complaint: No chief complaint on file.   HPI: Dawn [name] is a 34 y.o. female with history of sickle cell disease, who presents for painful crisis starting on Thursday, 3 days ago and has not been managaged by home medications. She reports nothing is different about this episode and her pain is 8/10. She had her last oxycodone at 8 pm last night. Her pain is in her arms, legs and back   Systemic Review: General: The patient denies anorexia, fever, weight loss Cardiac: Denies chest pain, syncope, palpitations, pedal edema  Respiratory: Denies cough, shortness of breath, wheezing GI: Denies severe indigestion/heartburn, abdominal pain, nausea, vomiting, diarrhea and constipation GU: Denies hematuria, incontinence, dysuria  Musculoskeletal: Denies arthritis  Skin: Denies suspicious skin lesions Neurologic: Denies focal weakness or numbness, change in vision  Past Medical History  Diagnosis Date  . Sickle cell disease   . Sickle cell disease, type S   . Blood transfusion   . 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 HCAP (healthcare-associated pneumonia) 05/19/2013  . Sickle cell anemia     Past Surgical History  Procedure Laterality Date  . Cholecystectomy    .  left knee acl reconstruction    . Cesarean section      x 2  . Portacath placement    . Portacath placement Left 02/23/2013    Procedure: INSERTION PORT-A-CATH;  Surgeon: Donato Heinz, MD;  Location: [facility];  Service: General;  Laterality: Left;  . Port-a-cath removal Left 07/29/2013    Procedure: REMOVAL PORT-A-CATH;  Surgeon: Scherry Ran, MD;  Location: [facility];  Service: General;  Laterality: Left;  . Tee without cardioversion N/A 08/07/2013    Procedure: TRANSESOPHAGEAL ECHOCARDIOGRAM (TEE);  Surgeon: Arnoldo Lenis, MD;  Location: [facility];  Service:  Cardiology;  Laterality: N/A;    Allergies  Allergen Reactions  . Desferal [Deferoxamine] Hives    Local reaction on arm only during infusion. Can take with benadryl   . Latex Other (See Comments)    REACTION: Pt experiences a burning sensation on contacted skin areas  . Lisinopril Other (See Comments) and Cough    REACTION: Sore/scratchy throat  . Tape Other (See Comments)    REACTION: Pt. Experiences a burning sensation on contacted skin areas    Family History  Problem Relation Age of Onset  . Sickle cell trait Mother   . Sickle cell trait Father   . Hypertension Father   . Diabetes Father   . Sickle cell trait Sister   . Cancer Paternal Aunt     Breast      Prior to Admission medications   Medication Sig Start Date End Date Taking? Authorizing Provider  cetirizine (ZYRTEC) 10 MG tablet TAKE 1 TABLET (10 MG TOTAL) BY MOUTH DAILY. 05/06/15  Yes Historical Provider, MD  folic acid (FOLVITE) 1 MG tablet Take 1 tablet (1 mg total) by mouth daily. 03/31/14  Yes [name], FNP  hydroxyurea (HYDREA) 500 MG capsule Take 2 capsules (1,000 mg total) by mouth daily. May take with food to minimize GI side effects. 02/14/15  Yes Leana Gamer, MD  Oxycodone HCl 10 MG TABS Take 1 tablet (10 mg total) by mouth every 4 (four) hours as needed for severe pain. 05/06/15  Yes Daleen Bo, MD  promethazine (PHENERGAN) 12.5 MG tablet Take 1 tablet (12.5 mg  total) by mouth every 6 (six) hours as needed for nausea. 03/24/15  Yes Elwyn Reach, MD  Vitamin D, Ergocalciferol, (DRISDOL) 50000 UNITS CAPS capsule Take 1 capsule (50,000 Units total) by mouth every 7 (seven) days. Takes on Sundays. 03/31/14  Yes [name], FNP  loratadine (CLARITIN) 10 MG tablet Take 10 mg by mouth daily.    Historical Provider, MD     Physical Exam: Filed Vitals:   05/23/15 0953  BP: 135/85  Pulse: 82  Temp: 98.8 F (37.1 C)  Resp: 16  Height: 5\' 3"  (1.6 m)  Weight: 156 lb (70.761 kg)  SpO2:  96%    General: Alert, awake, afebrile, anicteric, not in obvious distress HEENT: Normocephalic and Atraumatic, Mucous membranes pink                PERRLA; EOM intact; No scleral icterus,                 Nares: Patent, Oropharynx: Clear, Fair Dentition                 Neck: FROM, no cervical lymphadenopathy, thyromegaly, carotid bruit or JVD;  CHEST WALL: No tenderness  CHEST: Normal respiration, clear to auscultation bilaterally  HEART: Regular rate and rhythm; no murmurs rubs or gallops  BACK: No kyphosis or scoliosis; no CVA tenderness  ABDOMEN: Positive Bowel Sounds, soft, non-tender; no masses, no organomegaly Rectal Exam: deferred EXTREMITIES: No cyanosis, clubbing, or edema SKIN:  no rash or ulceration  CNS: Alert and Oriented x 4, Nonfocal exam, CN 2-12 intact  Labs on Admission:  Basic Metabolic Panel:  Recent Labs Lab 05/22/15 1504  NA 135  K 3.7  CL 107  CO2 23  GLUCOSE 80  BUN 7  CREATININE 0.33*  CALCIUM 8.1*   Liver Function Tests:  Recent Labs Lab 05/22/15 1504  AST 73*  ALT 34  ALKPHOS 150*  BILITOT 6.6*  PROT 7.6  ALBUMIN 3.1*   No results for input(s): LIPASE, AMYLASE in the last 168 hours. No results for input(s): AMMONIA in the last 168 hours. CBC:  Recent Labs Lab 05/22/15 1504  WBC 5.7  NEUTROABS 2.3  HGB 7.5*  HCT 20.6*  MCV 110.2*  PLT 230   Cardiac Enzymes: No results for input(s): CKTOTAL, CKMB, CKMBINDEX, TROPONINI in the last 168 hours.  BNP (last 3 results) No results for input(s): BNP in the last 8760 hours.  ProBNP (last 3 results) No results for input(s): PROBNP in the last 8760 hours.  CBG: No results for input(s): GLUCAP in the last 168 hours.   Assessment/Plan Active Problems:   Sickle cell anemia with crisis   [facility]  IVF D5 .45% Saline @ 125 mls/hour  Weight based Dilaudid PCA started within 30 minutes of admission  IV Toradol   Monitor vitals very closely, Re-evaluate pain  scale every hour  Oxygen by [place]  Patient will be re-evaluated for pain in the context of function and relationship to baseline as care progresses.  If no significant relieve from pain (remains above 5/10) will transfer patient to inpatient services for further evaluation and management  Code Status: Full  Family Communication: None  DVT Prophylaxis: Ambulate as tolerated   Time spent: 35 Minutes  Malee Grays, [place], MSN, FNP-BC  If 7PM-7AM, please contact night-coverage [email or web address] 05/23/2015, 10:20 AM

## 2015-05-23 NOTE — Progress Notes (Signed)
Pt received Dilaudid pca for sickle cell pain. Pt's pain has gone from 8 to 4 out of 10. Pt received prescriptions for pain and nausea. IV discontinued.

## 2015-06-16 ENCOUNTER — Ambulatory Visit: Payer: Self-pay | Admitting: Family Medicine

## 2015-06-24 ENCOUNTER — Encounter (HOSPITAL_COMMUNITY): Payer: Self-pay | Admitting: Emergency Medicine

## 2015-06-24 ENCOUNTER — Emergency Department (HOSPITAL_COMMUNITY)
Admission: EM | Admit: 2015-06-24 | Discharge: 2015-06-24 | Disposition: A | Discharge status: Home / Self Care | Payer: BLUE CROSS/BLUE SHIELD | Attending: Emergency Medicine | Admitting: Emergency Medicine

## 2015-06-24 ENCOUNTER — Emergency Department (HOSPITAL_COMMUNITY): Payer: BLUE CROSS/BLUE SHIELD

## 2015-06-24 DIAGNOSIS — Z3202 Encounter for pregnancy test, result negative: Secondary | ICD-10-CM | POA: Insufficient documentation

## 2015-06-24 DIAGNOSIS — Z8659 Personal history of other mental and behavioral disorders: Secondary | ICD-10-CM | POA: Insufficient documentation

## 2015-06-24 DIAGNOSIS — D5700 Hb-SS disease with crisis, unspecified: Secondary | ICD-10-CM | POA: Insufficient documentation

## 2015-06-24 DIAGNOSIS — Z8701 Personal history of pneumonia (recurrent): Secondary | ICD-10-CM | POA: Insufficient documentation

## 2015-06-24 DIAGNOSIS — Z79899 Other long term (current) drug therapy: Secondary | ICD-10-CM | POA: Insufficient documentation

## 2015-06-24 DIAGNOSIS — Z91040 Latex allergy status: Secondary | ICD-10-CM | POA: Diagnosis not present

## 2015-06-24 LAB — URINALYSIS, ROUTINE W REFLEX MICROSCOPIC
GLUCOSE, UA: NEGATIVE mg/dL
KETONES UR: NEGATIVE mg/dL
LEUKOCYTES UA: NEGATIVE
Nitrite: NEGATIVE
PROTEIN: 100 mg/dL — AB
Specific Gravity, Urine: 1.02 (ref 1.005–1.030)
Urobilinogen, UA: 1 mg/dL (ref 0.0–1.0)
pH: 6.5 (ref 5.0–8.0)

## 2015-06-24 LAB — POC URINE PREG, ED: Preg Test, Ur: NEGATIVE

## 2015-06-24 LAB — COMPREHENSIVE METABOLIC PANEL
ALK PHOS: 116 U/L (ref 38–126)
ALT: 26 U/L (ref 14–54)
AST: 65 U/L — AB (ref 15–41)
Albumin: 3.1 g/dL — ABNORMAL LOW (ref 3.5–5.0)
Anion gap: 7 (ref 5–15)
BILIRUBIN TOTAL: 7.1 mg/dL — AB (ref 0.3–1.2)
BUN: 7 mg/dL (ref 6–20)
CALCIUM: 8.3 mg/dL — AB (ref 8.9–10.3)
CO2: 25 mmol/L (ref 22–32)
Chloride: 109 mmol/L (ref 101–111)
Creatinine, Ser: 0.41 mg/dL — ABNORMAL LOW (ref 0.44–1.00)
GFR calc Af Amer: >60 mL/min (ref >60)
Glucose, Bld: 90 mg/dL (ref 65–99)
POTASSIUM: 3.6 mmol/L (ref 3.5–5.1)
Sodium: 141 mmol/L (ref 135–145)
TOTAL PROTEIN: 7.7 g/dL (ref 6.5–8.1)

## 2015-06-24 LAB — RETICULOCYTES
RBC.: 1.93 MIL/uL — AB (ref 3.87–5.11)
RETIC CT PCT: 18.3 % — AB (ref 0.4–3.1)
Retic Count, Absolute: 353.2 10*3/uL — ABNORMAL HIGH (ref 19.0–186.0)

## 2015-06-24 LAB — CBC WITH DIFFERENTIAL/PLATELET
BASOS ABS: 0 10*3/uL (ref 0.0–0.1)
BASOS PCT: 1 %
EOS PCT: 2 %
Eosinophils Absolute: 0.1 10*3/uL (ref 0.0–0.7)
HCT: 21.7 % — ABNORMAL LOW (ref 36.0–46.0)
Hemoglobin: 8 g/dL — ABNORMAL LOW (ref 12.0–15.0)
Lymphocytes Relative: 44 %
Lymphs Abs: 2.7 10*3/uL (ref 0.7–4.0)
MCH: 41.5 pg — ABNORMAL HIGH (ref 26.0–34.0)
MCHC: 36.9 g/dL — AB (ref 30.0–36.0)
MCV: 112.4 fL — ABNORMAL HIGH (ref 78.0–100.0)
MONO ABS: 0.8 10*3/uL (ref 0.1–1.0)
Monocytes Relative: 13 %
Neutro Abs: 2.5 10*3/uL (ref 1.7–7.7)
Neutrophils Relative %: 40 %
Platelets: 161 10*3/uL (ref 150–400)
RBC: 1.93 MIL/uL — ABNORMAL LOW (ref 3.87–5.11)
RDW: 18.6 % — AB (ref 11.5–15.5)
WBC: 6.2 10*3/uL (ref 4.0–10.5)

## 2015-06-24 LAB — URINE MICROSCOPIC-ADD ON

## 2015-06-24 MED ORDER — OXYCODONE HCL 10 MG PO TABS: 10.0000 mg | ORAL_TABLET | ORAL | Status: DC | PRN

## 2015-06-24 MED ORDER — HYDROMORPHONE HCL 2 MG/ML IJ SOLN: 0.0250 mg/kg | INTRAMUSCULAR | Status: AC

## 2015-06-24 MED ORDER — HYDROMORPHONE HCL 2 MG/ML IJ SOLN
2.0000 mg | Freq: Once | INTRAMUSCULAR | Status: AC
  Administered 2015-06-24: 2 mg via INTRAVENOUS
  Filled 2015-06-24: qty 1

## 2015-06-24 MED ORDER — HYDROMORPHONE HCL 2 MG/ML IJ SOLN
2.0000 mg | Freq: Once | INTRAMUSCULAR | Status: AC
  Administered 2015-06-24: 2 mg via INTRAVENOUS

## 2015-06-24 MED ORDER — SODIUM CHLORIDE 0.9 % IV BOLUS (SEPSIS)
1000.0000 mL | Freq: Once | INTRAVENOUS | Status: AC
  Administered 2015-06-24: 1000 mL via INTRAVENOUS

## 2015-06-24 MED ORDER — HYDROMORPHONE HCL 2 MG/ML IJ SOLN
2.0000 mg | INTRAMUSCULAR | Status: AC
  Administered 2015-06-24: 2 mg via INTRAVENOUS
  Filled 2015-06-24: qty 1

## 2015-06-24 MED ORDER — PROMETHAZINE HCL 25 MG/ML IJ SOLN
25.0000 mg | Freq: Once | INTRAMUSCULAR | Status: AC
  Administered 2015-06-24: 25 mg via INTRAVENOUS
  Filled 2015-06-24: qty 1

## 2015-06-24 MED ORDER — HYDROMORPHONE HCL 2 MG/ML IJ SOLN
INTRAMUSCULAR | Status: AC
  Filled 2015-06-24: qty 1

## 2015-06-24 NOTE — ED Provider Notes (Signed)
CSN: [id number]     Arrival date & time 06/24/15  1300 History   First MD Initiated Contact with Patient 06/24/15 1341     Chief Complaint  Patient presents with  . Sickle Cell Pain Crisis     (Consider location/radiation/quality/duration/timing/severity/associated sxs/prior Treatment) HPI Patient presents with concern of chest pain, back pain, bilateral leg pain. Symptoms began about 2 days ago. Since onset symptoms of been persistent, worsening. Pain is sore, not improved with home narcotic use. There is no new dyspnea, fever, nausea, vomiting. Patient notes that the pain is similar to that she has expressed multiple prior sickle cell pain exacerbations.  Past Medical History  Diagnosis Date  . Sickle cell disease   . Sickle cell disease, type S   . Blood transfusion   . 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 HCAP (healthcare-associated pneumonia) 05/19/2013  . Sickle cell anemia    Past Surgical History  Procedure Laterality Date  . Cholecystectomy    .  left knee acl reconstruction    . Cesarean section      x 2  . Portacath placement    . Portacath placement Left 02/23/2013    Procedure: INSERTION PORT-A-CATH;  Surgeon: Donato Heinz, MD;  Location: [facility];  Service: General;  Laterality: Left;  . Port-a-cath removal Left 07/29/2013    Procedure: REMOVAL PORT-A-CATH;  Surgeon: Scherry Ran, MD;  Location: [facility];  Service: General;  Laterality: Left;  . Tee without cardioversion N/A 08/07/2013    Procedure: TRANSESOPHAGEAL ECHOCARDIOGRAM (TEE);  Surgeon: Arnoldo Lenis, MD;  [facility];  Service: Cardiology;  Laterality: N/A;   Family History  Problem Relation Age of Onset  . Sickle cell trait Mother   . Sickle cell trait Father   . Hypertension Father   . Diabetes Father   . Sickle cell trait Sister   . Cancer Paternal Aunt     Breast   Social History  Substance Use Topics  . Smoking status: Never Smoker   . Smokeless tobacco:  Former Systems developer  . Alcohol Use: No   OB History    Gravida Para Term Preterm AB TAB SAB Ectopic Multiple Living   1 1  1      1      Review of Systems  Constitutional:       Per HPI, otherwise negative  HENT:       Per HPI, otherwise negative  Respiratory:       Per HPI, otherwise negative  Cardiovascular:       Per HPI, otherwise negative  Gastrointestinal: Negative for vomiting.  Endocrine:       Negative aside from HPI  Genitourinary:       Neg aside from HPI   Musculoskeletal:       Per HPI, otherwise negative  Skin: Negative.   Neurological: Negative for syncope.      Allergies  Desferal; Latex; Lisinopril; and Tape  Home Medications   Prior to Admission medications   Medication Sig Start Date End Date Taking? Authorizing Provider  cetirizine (ZYRTEC) 10 MG tablet TAKE 1 TABLET (10 MG TOTAL) BY MOUTH DAILY. 05/06/15  Yes Historical Provider, MD  folic acid (FOLVITE) 1 MG tablet Take 1 tablet (1 mg total) by mouth daily. 03/31/14  Yes [name], FNP  hydroxyurea (HYDREA) 500 MG capsule Take 2 capsules (1,000 mg total) by mouth daily. May take with food to minimize GI side effects. 02/14/15  Yes Leana Gamer, MD  Influenza vac split quadrivalent PF (FLUARIX) 0.5 ML injection Inject 0.5 mLs into the muscle once.   Yes Historical Provider, MD  Oxycodone HCl 10 MG TABS Take 1 tablet (10 mg total) by mouth every 4 (four) hours as needed. Patient taking differently: Take 10 mg by mouth every 4 (four) hours as needed.  05/23/15  Yes Micheline Chapman, NP  promethazine (PHENERGAN) 12.5 MG tablet Take 1 tablet (12.5 mg total) by mouth every 6 (six) hours as needed for nausea. 05/23/15  Yes Micheline Chapman, NP  Vitamin D, Ergocalciferol, (DRISDOL) 50000 UNITS CAPS capsule Take 1 capsule (50,000 Units total) by mouth every 7 (seven) days. Takes on Sundays. 03/31/14  Yes [name], FNP   BP 140/83 mmHg  Pulse 89  Temp(Src) 98.3 F (36.8 C)  Resp 18  Ht 5\' 3"  (1.6  m)  Wt 157 lb (71.215 kg)  BMI 27.82 kg/m2  SpO2 97% Physical Exam  Constitutional: She is oriented to person, place, and time. She appears well-developed and well-nourished. No distress.  HENT:  Head: Normocephalic and atraumatic.  Eyes: Conjunctivae and EOM are normal.  Cardiovascular: Normal rate and regular rhythm.   Pulmonary/Chest: Effort normal and breath sounds normal. No stridor. No respiratory distress.  Abdominal: She exhibits no distension.  Musculoskeletal: She exhibits no edema.  Neurological: She is alert and oriented to person, place, and time. No cranial nerve deficit.  Skin: Skin is warm and dry.  Psychiatric: She has a normal mood and affect.  Nursing note and vitals reviewed.   ED Course  Procedures (including critical care time) Labs Review Labs Reviewed  CBC WITH DIFFERENTIAL/PLATELET - Abnormal; Notable for the following:    RBC 1.93 (*)    Hemoglobin 8.0 (*)    HCT 21.7 (*)    MCV 112.4 (*)    MCH 41.5 (*)    MCHC 36.9 (*)    RDW 18.6 (*)    All other components within normal limits  RETICULOCYTES - Abnormal; Notable for the following:    Retic Ct Pct 18.3 (*)    RBC. 1.93 (*)    Retic Count, Manual 353.2 (*)    All other components within normal limits  COMPREHENSIVE METABOLIC PANEL - Abnormal; Notable for the following:    Creatinine, Ser 0.41 (*)    Calcium 8.3 (*)    Albumin 3.1 (*)    AST 65 (*)    Total Bilirubin 7.1 (*)    All other components within normal limits  URINALYSIS, ROUTINE W REFLEX MICROSCOPIC (NOT AT [facility]) - Abnormal; Notable for the following:    Hgb urine dipstick LARGE (*)    Bilirubin Urine MODERATE (*)    Protein, ur 100 (*)    All other components within normal limits  URINE MICROSCOPIC-ADD ON - Abnormal; Notable for the following:    Squamous Epithelial / LPF MANY (*)    Bacteria, UA MANY (*)    Casts GRANULAR CAST (*)    All other components within normal limits  POC URINE PREG, ED    Imaging Review Dg Chest  2 View  06/24/2015   CLINICAL DATA:  Chest pain.  Sickle cell disease.  EXAM: CHEST  2 VIEW  COMPARISON:  02/02/2015 chest radiograph.  FINDINGS: Stable cardiomediastinal silhouette with normal heart size. No pneumothorax. No pleural effusion. Clear lungs. No focal lung consolidation. No pulmonary edema. Cholecystectomy clips are seen in the right upper quadrant of the abdomen.  IMPRESSION: No active disease in the  chest.   Electronically Signed   By: Ilona Sorrel M.D.   On: 06/24/2015 15:40   I have personally reviewed and evaluated these images and lab results as part of my medical decision-making.   EKG Interpretation   Date/Time:  Friday June 24 2015 14:50:03 EDT Ventricular Rate:  95 PR Interval:  168 QRS Duration: 91 QT Interval:  376 QTC Calculation: 473 R Axis:   49 Text Interpretation:  Sinus rhythm Probable inferior infarct, age  indeterminate Sinus rhythm Artifact Abnormal ekg Confirmed by [name]  MD [phone]) on 06/24/2015 3:14:21 PM     Pulse oxygen 100% room air normal  On repeat exam the patient appears better, continues to have pain, though now 6/10.   On repeat exam the patient's pain is minimized.  We discussed all findings at length, and the patient has scheduled follow-up with primary care in 1 week. She requests a short course of medication to last until that time.   MDM  Female with sickle cell disease presents with chest pain. Here the patient is afebrile, not hypoxic, tachypneic. No evidence for acute chest, and pain resolves with analgesia, fluids, antiemetics. With no red flags, consistent vitals, labs, she was discharged to follow up with her physician.  [name], MD 06/24/15 [phone]

## 2015-06-24 NOTE — ED Notes (Signed)
Patient with no complaints at this time. Respirations even and unlabored. Skin warm/dry. Discharge instructions reviewed with patient at this time. Patient given opportunity to voice concerns/ask questions. IV removed per policy and band-aid applied to site. Patient discharged at this time and left Emergency Department with steady gait.  

## 2015-06-24 NOTE — Discharge Instructions (Signed)
As discussed, your evaluation today has been largely reassuring.  But, it is important that you monitor your condition carefully, and do not hesitate to return to the ED if you develop new, or concerning changes in your condition. ? ?Otherwise, please follow-up with your physician for appropriate ongoing care. ? ?

## 2015-06-24 NOTE — ED Notes (Signed)
Pt reports onset of sickle cell crisis 2 days ago. Pt c/opain in her legs, chest and back.

## 2015-06-27 ENCOUNTER — Encounter (HOSPITAL_COMMUNITY): Payer: Self-pay | Admitting: Emergency Medicine

## 2015-06-27 ENCOUNTER — Emergency Department (HOSPITAL_COMMUNITY): Payer: BLUE CROSS/BLUE SHIELD

## 2015-06-27 ENCOUNTER — Inpatient Hospital Stay (HOSPITAL_COMMUNITY)
Admission: EM | Admit: 2015-06-27 | Discharge: 2015-07-07 | DRG: 811 | Disposition: A | Discharge status: Home / Self Care | Payer: BLUE CROSS/BLUE SHIELD | Attending: Internal Medicine | Admitting: Internal Medicine

## 2015-06-27 DIAGNOSIS — Z833 Family history of diabetes mellitus: Secondary | ICD-10-CM

## 2015-06-27 DIAGNOSIS — J9601 Acute respiratory failure with hypoxia: Secondary | ICD-10-CM | POA: Diagnosis not present

## 2015-06-27 DIAGNOSIS — D539 Nutritional anemia, unspecified: Secondary | ICD-10-CM | POA: Diagnosis present

## 2015-06-27 DIAGNOSIS — D5700 Hb-SS disease with crisis, unspecified: Secondary | ICD-10-CM | POA: Diagnosis present

## 2015-06-27 DIAGNOSIS — E538 Deficiency of other specified B group vitamins: Secondary | ICD-10-CM | POA: Diagnosis present

## 2015-06-27 DIAGNOSIS — D6959 Other secondary thrombocytopenia: Secondary | ICD-10-CM | POA: Diagnosis present

## 2015-06-27 DIAGNOSIS — E876 Hypokalemia: Secondary | ICD-10-CM | POA: Diagnosis present

## 2015-06-27 DIAGNOSIS — R0902 Hypoxemia: Secondary | ICD-10-CM

## 2015-06-27 DIAGNOSIS — J810 Acute pulmonary edema: Secondary | ICD-10-CM | POA: Diagnosis present

## 2015-06-27 DIAGNOSIS — Z8249 Family history of ischemic heart disease and other diseases of the circulatory system: Secondary | ICD-10-CM | POA: Diagnosis not present

## 2015-06-27 DIAGNOSIS — Z803 Family history of malignant neoplasm of breast: Secondary | ICD-10-CM

## 2015-06-27 DIAGNOSIS — D571 Sickle-cell disease without crisis: Secondary | ICD-10-CM | POA: Diagnosis present

## 2015-06-27 DIAGNOSIS — J811 Chronic pulmonary edema: Secondary | ICD-10-CM

## 2015-06-27 DIAGNOSIS — R079 Chest pain, unspecified: Secondary | ICD-10-CM | POA: Diagnosis present

## 2015-06-27 DIAGNOSIS — D696 Thrombocytopenia, unspecified: Secondary | ICD-10-CM | POA: Diagnosis present

## 2015-06-27 DIAGNOSIS — I517 Cardiomegaly: Secondary | ICD-10-CM | POA: Diagnosis present

## 2015-06-27 HISTORY — DX: Deficiency of other specified B group vitamins: E53.8

## 2015-06-27 HISTORY — DX: Cardiomegaly: I51.7

## 2015-06-27 LAB — BASIC METABOLIC PANEL
Anion gap: 7 (ref 5–15)
BUN: 9 mg/dL (ref 6–20)
CALCIUM: 8.5 mg/dL — AB (ref 8.9–10.3)
CO2: 22 mmol/L (ref 22–32)
Chloride: 109 mmol/L (ref 101–111)
Creatinine, Ser: 0.6 mg/dL (ref 0.44–1.00)
GFR calc Af Amer: >60 mL/min (ref >60)
GLUCOSE: 86 mg/dL (ref 65–99)
Potassium: 3.3 mmol/L — ABNORMAL LOW (ref 3.5–5.1)
Sodium: 138 mmol/L (ref 135–145)

## 2015-06-27 LAB — CBC WITH DIFFERENTIAL/PLATELET
BAND NEUTROPHILS: 0 %
BASOS ABS: 0.1 10*3/uL (ref 0.0–0.1)
Basophils Relative: 1 %
Blasts: 0 %
Eosinophils Absolute: 0.1 10*3/uL (ref 0.0–0.7)
Eosinophils Relative: 1 %
HEMATOCRIT: 21.9 % — AB (ref 36.0–46.0)
HEMOGLOBIN: 8.2 g/dL — AB (ref 12.0–15.0)
LYMPHS ABS: 2.6 10*3/uL (ref 0.7–4.0)
Lymphocytes Relative: 44 %
MCH: 41.8 pg — ABNORMAL HIGH (ref 26.0–34.0)
MCHC: 37.4 g/dL — ABNORMAL HIGH (ref 30.0–36.0)
MCV: 111.7 fL — AB (ref 78.0–100.0)
METAMYELOCYTES PCT: 0 %
MYELOCYTES: 0 %
Monocytes Absolute: 0.6 10*3/uL (ref 0.1–1.0)
Monocytes Relative: 10 %
Neutro Abs: 2.5 10*3/uL (ref 1.7–7.7)
Neutrophils Relative %: 44 %
PLATELETS: 141 10*3/uL — AB (ref 150–400)
PROMYELOCYTES ABS: 0 %
RBC: 1.96 MIL/uL — ABNORMAL LOW (ref 3.87–5.11)
RDW: 19.2 % — ABNORMAL HIGH (ref 11.5–15.5)
WBC: 5.9 10*3/uL (ref 4.0–10.5)
nRBC: 12 /100 WBC — ABNORMAL HIGH

## 2015-06-27 LAB — RETICULOCYTES
RBC.: 1.96 MIL/uL — ABNORMAL LOW (ref 3.87–5.11)
Retic Count, Absolute: 374.4 10*3/uL — ABNORMAL HIGH (ref 19.0–186.0)
Retic Ct Pct: 19.1 % — ABNORMAL HIGH (ref 0.4–3.1)

## 2015-06-27 MED ORDER — PROMETHAZINE HCL 25 MG/ML IJ SOLN
12.5000 mg | Freq: Once | INTRAMUSCULAR | Status: AC
  Administered 2015-06-27: 12.5 mg via INTRAVENOUS
  Filled 2015-06-27: qty 1

## 2015-06-27 MED ORDER — SODIUM CHLORIDE 0.9 % IV BOLUS (SEPSIS)
1000.0000 mL | Freq: Once | INTRAVENOUS | Status: AC
  Administered 2015-06-27: 1000 mL via INTRAVENOUS

## 2015-06-27 MED ORDER — HYDROXYUREA 500 MG PO CAPS
1000.0000 mg | ORAL_CAPSULE | Freq: Every day | ORAL | Status: DC
  Administered 2015-06-28 – 2015-07-07 (×10): 1000 mg via ORAL
  Filled 2015-06-27 (×10): qty 2

## 2015-06-27 MED ORDER — SODIUM CHLORIDE 0.9 % IV SOLN: INTRAVENOUS | Status: AC

## 2015-06-27 MED ORDER — DIPHENHYDRAMINE HCL 25 MG PO CAPS
25.0000 mg | ORAL_CAPSULE | Freq: Four times a day (QID) | ORAL | Status: DC | PRN
  Administered 2015-06-27 – 2015-07-07 (×8): 25 mg via ORAL
  Filled 2015-06-27 (×9): qty 1

## 2015-06-27 MED ORDER — FOLIC ACID 1 MG PO TABS
1.0000 mg | ORAL_TABLET | Freq: Every day | ORAL | Status: DC
  Administered 2015-06-28 – 2015-07-07 (×10): 1 mg via ORAL
  Filled 2015-06-27 (×10): qty 1

## 2015-06-27 MED ORDER — HYDROMORPHONE HCL 1 MG/ML IJ SOLN
2.0000 mg | Freq: Once | INTRAMUSCULAR | Status: AC
  Administered 2015-06-27: 2 mg via INTRAVENOUS
  Filled 2015-06-27: qty 2

## 2015-06-27 MED ORDER — HYDROMORPHONE HCL 1 MG/ML IJ SOLN
1.0000 mg | INTRAMUSCULAR | Status: DC | PRN
  Administered 2015-06-27 – 2015-06-28 (×7): 1 mg via INTRAVENOUS
  Filled 2015-06-27 (×7): qty 1

## 2015-06-27 MED ORDER — LORATADINE 10 MG PO TABS: 10.0000 mg | ORAL_TABLET | Freq: Every day | ORAL | Status: DC

## 2015-06-27 MED ORDER — VITAMIN D (ERGOCALCIFEROL) 1.25 MG (50000 UNIT) PO CAPS
50000.0000 [IU] | ORAL_CAPSULE | ORAL | Status: DC
  Administered 2015-07-03: 50000 [IU] via ORAL
  Filled 2015-06-27: qty 1

## 2015-06-27 MED ORDER — ENOXAPARIN SODIUM 40 MG/0.4ML ~~LOC~~ SOLN: 40.0000 mg | SUBCUTANEOUS | Status: DC

## 2015-06-27 MED ORDER — PROMETHAZINE HCL 25 MG/ML IJ SOLN
INTRAMUSCULAR | Status: AC
  Filled 2015-06-27: qty 1

## 2015-06-27 MED ORDER — ENOXAPARIN SODIUM 40 MG/0.4ML ~~LOC~~ SOLN
40.0000 mg | SUBCUTANEOUS | Status: DC
  Administered 2015-06-27 – 2015-07-03 (×6): 40 mg via SUBCUTANEOUS
  Filled 2015-06-27 (×7): qty 0.4

## 2015-06-27 MED ORDER — PROMETHAZINE HCL 12.5 MG PO TABS
12.5000 mg | ORAL_TABLET | Freq: Four times a day (QID) | ORAL | Status: DC | PRN
  Administered 2015-06-28 – 2015-07-02 (×6): 12.5 mg via ORAL
  Filled 2015-06-27 (×8): qty 1

## 2015-06-27 MED ORDER — LORATADINE 10 MG PO TABS
10.0000 mg | ORAL_TABLET | Freq: Every day | ORAL | Status: DC
  Administered 2015-06-28 – 2015-07-07 (×10): 10 mg via ORAL
  Filled 2015-06-27 (×10): qty 1

## 2015-06-27 MED ORDER — FOLIC ACID 1 MG PO TABS: 1.0000 mg | ORAL_TABLET | Freq: Every day | ORAL | Status: DC

## 2015-06-27 MED ORDER — OXYCODONE HCL 5 MG PO TABS
10.0000 mg | ORAL_TABLET | ORAL | Status: DC | PRN
  Filled 2015-06-27: qty 2

## 2015-06-27 MED ORDER — SODIUM CHLORIDE 0.9 % IV SOLN
INTRAVENOUS | Status: DC
  Administered 2015-06-27 – 2015-07-05 (×13): via INTRAVENOUS

## 2015-06-27 NOTE — ED Notes (Signed)
Patient complaining of pain in chest and bilateral legs x 1 week. States she was treated here Friday but pain has returned.

## 2015-06-27 NOTE — H&P (Signed)
[facility] History and Physical  SHEKITA BOYDEN RCV:[id number] DOB: 1980-12-04 DOA: 06/27/2015  Referring physician: ER  PCP: [name],[name], MD   Chief Complaint: Chest and leg pains  HPI: Dawn [name] is a 34 y.o. female  This is a well known compliant patient with Sickle cell disease who presents again with sternal and bilateral leg pains for the last few days.She has tried all her home medications without success.She now comes in for pain control.She denies dyspnea,fever or cough.   Review of Systems:  Apart from symptoms above,all systems negative  Past Medical History  Diagnosis Date  . Sickle cell disease ([place])   . Sickle cell disease, type S ([place])   . Blood transfusion   . 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 HCAP (healthcare-associated pneumonia) 05/19/2013  . Sickle cell anemia [facility])    Past Surgical History  Procedure Laterality Date  . Cholecystectomy    .  left knee acl reconstruction    . Cesarean section      x 2  . Portacath placement    . Portacath placement Left 02/23/2013    Procedure: INSERTION PORT-A-CATH;  Surgeon: Donato Heinz, MD;  Location: [facility];  Service: General;  Laterality: Left;  . Port-a-cath removal Left 07/29/2013    Procedure: REMOVAL PORT-A-CATH;  Surgeon: Scherry Ran, MD;  Location: [facility];  Service: General;  Laterality: Left;  . Tee without cardioversion N/A 08/07/2013    Procedure: TRANSESOPHAGEAL ECHOCARDIOGRAM (TEE);  Surgeon: Arnoldo Lenis, MD;  Location[facility];  Service: Cardiology;  Laterality: N/A;   Social History:  reports that she has never smoked. She has quit using smokeless tobacco. She reports that she does not drink alcohol or use illicit drugs.  Allergies  Allergen Reactions  . Desferal [Deferoxamine] Hives    Local reaction on arm only during infusion. Can take with benadryl   . Latex Other (See Comments)    REACTION: Pt experiences a burning sensation on contacted skin areas  .  Lisinopril Other (See Comments) and Cough    REACTION: Sore/scratchy throat  . Tape Other (See Comments)    REACTION: Pt. Experiences a burning sensation on contacted skin areas    Family History  Problem Relation Age of Onset  . Sickle cell trait Mother   . Sickle cell trait Father   . Hypertension Father   . Diabetes Father   . Sickle cell trait Sister   . Cancer Paternal Aunt     Breast    Prior to Admission medications   Medication Sig Start Date End Date Taking? Authorizing Provider  cetirizine (ZYRTEC) 10 MG tablet TAKE 1 TABLET (10 MG TOTAL) BY MOUTH DAILY. 05/06/15  Yes Historical Provider, MD  folic acid (FOLVITE) 1 MG tablet Take 1 tablet (1 mg total) by mouth daily. 03/31/14  Yes [name], FNP  hydroxyurea (HYDREA) 500 MG capsule Take 2 capsules (1,000 mg total) by mouth daily. May take with food to minimize GI side effects. 02/14/15  Yes Leana Gamer, MD  Oxycodone HCl 10 MG TABS Take 1 tablet (10 mg total) by mouth every 4 (four) hours as needed. 06/24/15  Yes [name], MD  promethazine (PHENERGAN) 12.5 MG tablet Take 1 tablet (12.5 mg total) by mouth every 6 (six) hours as needed for nausea. 05/23/15  Yes Micheline Chapman, NP  Vitamin D, Ergocalciferol, (DRISDOL) 50000 UNITS CAPS capsule Take 1 capsule (50,000 Units total) by mouth every 7 (seven) days. Takes  on Sundays. 03/31/14  Yes [name], FNP  Influenza vac split quadrivalent PF (FLUARIX) 0.5 ML injection Inject 0.5 mLs into the muscle once.    Historical Provider, MD   Physical Exam: Filed Vitals:   06/27/15 1549 06/27/15 1603  BP: 105/59 151/80  Pulse: 98 98  Temp: 98.2 F (36.8 C)   TempSrc: Oral   Resp: 20 18  Height: 5\' 3"  (1.6 m)   Weight: 70.761 kg (156 lb)   SpO2: 96% 92%    Wt Readings from Last 3 Encounters:  06/27/15 70.761 kg (156 lb)  06/24/15 71.215 kg (157 lb)  05/23/15 70.761 kg (156 lb)    General:  Appears in pain Eyes: PERRL, normal lids, irises &  conjunctiva ENT: grossly normal hearing, lips & tongue Neck: no LAD, masses or thyromegaly Cardiovascular: RRR, no m/r/g. No LE edema. Telemetry: SR, no arrhythmias  Respiratory: CTA bilaterally, no w/r/r. Normal respiratory effort. Abdomen: soft, ntnd Skin: no rash or induration seen on limited exam Musculoskeletal: grossly normal tone BUE/BLE Psychiatric: grossly normal mood and affect, speech fluent and appropriate Neurologic: grossly non-focal.          Labs on Admission:  Basic Metabolic Panel:  Recent Labs Lab 06/24/15 1504 06/27/15 1606  NA 141 138  K 3.6 3.3*  CL 109 109  CO2 25 22  GLUCOSE 90 86  BUN 7 9  CREATININE 0.41* 0.60  CALCIUM 8.3* 8.5*   Liver Function Tests:  Recent Labs Lab 06/24/15 1504  AST 65*  ALT 26  ALKPHOS 116  BILITOT 7.1*  PROT 7.7  ALBUMIN 3.1*   No results for input(s): LIPASE, AMYLASE in the last 168 hours. No results for input(s): AMMONIA in the last 168 hours. CBC:  Recent Labs Lab 06/24/15 1504 06/27/15 1606  WBC 6.2 5.9  NEUTROABS 2.5 2.5  HGB 8.0* 8.2*  HCT 21.7* 21.9*  MCV 112.4* 111.7*  PLT 161 141*   Cardiac Enzymes: No results for input(s): CKTOTAL, CKMB, CKMBINDEX, TROPONINI in the last 168 hours.  BNP (last 3 results) No results for input(s): BNP in the last 8760 hours.  ProBNP (last 3 results) No results for input(s): PROBNP in the last 8760 hours.  CBG: No results for input(s): GLUCAP in the last 168 hours.  Radiological Exams on Admission: Dg Chest 2 View  06/27/2015   CLINICAL DATA:  Chest pain  EXAM: CHEST  2 VIEW  COMPARISON:  06/24/2015  FINDINGS: Heart is upper normal in size. Clear lungs. No pleural effusion. No pneumothorax. No consolidation. Minimal subsegmental atelectasis at the lung bases.  IMPRESSION: No active cardiopulmonary disease.   Electronically Signed   By: Marybelle Killings M.D.   On: 06/27/2015 17:15      Assessment/Plan   1. Sickle cell crisis.Treat with analgesia,iv  fluids.Monitor CBC closely.  Further recommendations will depend on hospital progress.     Code Status: FULL  DVT Prophylaxis:Lovenox  Family Communication: Spoke with patient at bedside   Disposition Plan: Home when medically stable   Time spent: 45 mins  [facility] Pager [phone]

## 2015-06-27 NOTE — ED Provider Notes (Signed)
CSN: [id number]     Arrival date & time 06/27/15  1543 History   First MD Initiated Contact with Patient 06/27/15 1553     Chief Complaint  Patient presents with  . Sickle Cell Pain Crisis     (Consider location/radiation/quality/duration/timing/severity/associated sxs/prior Treatment) Patient is a 34 y.o. female presenting with sickle cell pain. The history is provided by the patient.  Sickle Cell Pain Crisis Associated symptoms: chest pain   Associated symptoms: no congestion, no fever, no headaches, no nausea, no shortness of breath and no vomiting    patient with known history of sickle cell disease. Followed by Dr. Zigmund Daniel at sickle clinic at [facility]. Patient seen here Friday for sickle cell crisis. Patient had lab work and chest x-ray that was consistent with sickle cell crisis chest x-ray was normal. Patient pain improved some from a 10 out of 10-4 out of 10 at discharge discharge home on her normal oral pain medicines as per recommendation of Dr. Zigmund Daniel. However pain. Escalate over the weekend. A sheet contacted sickle cell clinic recommended she get seen here with consideration for admission for pain control. Patient's pain is still anterior chest and bilateral legs. No fevers. Patient states there is no associated illnesses. Pain rated 10 out of 10.  Past Medical History  Diagnosis Date  . Sickle cell disease ([place])   . 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 Blood transfusion   . 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 HCAP (healthcare-associated pneumonia) 05/19/2013  . Sickle cell anemia [facility])    Past Surgical History  Procedure Laterality Date  . Cholecystectomy    .  left knee acl reconstruction    . Cesarean section      x 2  . Portacath placement    . Portacath placement Left 02/23/2013    Procedure: INSERTION PORT-A-CATH;  Surgeon: Donato Heinz, MD;  Location: [facility];  Service: General;  Laterality: Left;  . Port-a-cath removal Left 07/29/2013   Procedure: REMOVAL PORT-A-CATH;  Surgeon: Scherry Ran, MD;  Location: [facility];  Service: General;  Laterality: Left;  . Tee without cardioversion N/A 08/07/2013    Procedure: TRANSESOPHAGEAL ECHOCARDIOGRAM (TEE);  Surgeon: Arnoldo Lenis, MD;  Location: [facility] SU[facility];  Service: Cardiology;  Laterality: N/A;   Family History  Problem Relation Age of Onset  . Sickle cell trait Mother   . Sickle cell trait Father   . Hypertension Father   . Diabetes Father   . Sickle cell trait Sister   . Cancer Paternal Aunt     Breast   Social History  Substance Use Topics  . Smoking status: Never Smoker   . Smokeless tobacco: Former Systems developer  . Alcohol Use: No   OB History    Gravida Para Term Preterm AB TAB SAB Ectopic Multiple Living   1 1  1      1      Review of Systems  Constitutional: Negative for fever.  HENT: Negative for congestion.   Eyes: Negative for visual disturbance.  Respiratory: Negative for shortness of breath.   Cardiovascular: Positive for chest pain.  Gastrointestinal: Negative for nausea, vomiting and abdominal pain.  Genitourinary: Negative for dysuria.  Musculoskeletal: Negative for back pain.  Skin: Negative for rash.  Neurological: Negative for headaches.  Psychiatric/Behavioral: Negative for confusion.      Allergies  Desferal; Latex; Lisinopril; and Tape  Home Medications   Prior to Admission medications   Medication Sig Start Date End Date Taking?  Authorizing Provider  cetirizine (ZYRTEC) 10 MG tablet TAKE 1 TABLET (10 MG TOTAL) BY MOUTH DAILY. 05/06/15  Yes Historical Provider, MD  folic acid (FOLVITE) 1 MG tablet Take 1 tablet (1 mg total) by mouth daily. 03/31/14  Yes [name], FNP  hydroxyurea (HYDREA) 500 MG capsule Take 2 capsules (1,000 mg total) by mouth daily. May take with food to minimize GI side effects. 02/14/15  Yes Leana Gamer, MD  Oxycodone HCl 10 MG TABS Take 1 tablet (10 mg total) by mouth every 4 (four) hours as needed.  06/24/15  Yes [name], MD  promethazine (PHENERGAN) 12.5 MG tablet Take 1 tablet (12.5 mg total) by mouth every 6 (six) hours as needed for nausea. 05/23/15  Yes Micheline Chapman, NP  Vitamin D, Ergocalciferol, (DRISDOL) 50000 UNITS CAPS capsule Take 1 capsule (50,000 Units total) by mouth every 7 (seven) days. Takes on Sundays. 03/31/14  Yes [name], FNP  Influenza vac split quadrivalent PF (FLUARIX) 0.5 ML injection Inject 0.5 mLs into the muscle once.    Historical Provider, MD   BP 151/80 mmHg  Pulse 98  Temp(Src) 98.2 F (36.8 C) (Oral)  Resp 18  Ht 5\' 3"  (1.6 m)  Wt 156 lb (70.761 kg)  BMI 27.64 kg/m2  SpO2 92% Physical Exam  Constitutional: She is oriented to person, place, and time. She appears well-developed and well-nourished. No distress.  HENT:  Head: Normocephalic and atraumatic.  Mouth/Throat: Oropharynx is clear and moist.  Eyes: Conjunctivae and EOM are normal. Pupils are equal, round, and reactive to light. Scleral icterus is present.  Neck: Normal range of motion. Neck supple.  Cardiovascular: Normal rate, regular rhythm and normal heart sounds.   Pulmonary/Chest: Effort normal and breath sounds normal. No respiratory distress.  Abdominal: Soft. Bowel sounds are normal. There is no tenderness.  Musculoskeletal: Normal range of motion.  Neurological: She is alert and oriented to person, place, and time. No cranial nerve deficit. She exhibits normal muscle tone. Coordination normal.  Skin: Skin is warm. No rash noted.  Nursing note and vitals reviewed.   ED Course  Procedures (including critical care time) Labs Review Labs Reviewed  BASIC METABOLIC PANEL - Abnormal; Notable for the following:    Potassium 3.3 (*)    Calcium 8.5 (*)    All other components within normal limits  CBC WITH DIFFERENTIAL/PLATELET - Abnormal; Notable for the following:    RBC 1.96 (*)    Hemoglobin 8.2 (*)    HCT 21.9 (*)    MCV 111.7 (*)    MCH 41.8 (*)    MCHC 37.4  (*)    RDW 19.2 (*)    Platelets 141 (*)    nRBC 12 (*)    All other components within normal limits  RETICULOCYTES - Abnormal; Notable for the following:    Retic Ct Pct 19.1 (*)    RBC. 1.96 (*)    Retic Count, Manual 374.4 (*)    All other components within normal limits  URINALYSIS, ROUTINE W REFLEX MICROSCOPIC (NOT AT [facility])    Imaging Review Dg Chest 2 View  06/27/2015   CLINICAL DATA:  Chest pain  EXAM: CHEST  2 VIEW  COMPARISON:  06/24/2015  FINDINGS: Heart is upper normal in size. Clear lungs. No pleural effusion. No pneumothorax. No consolidation. Minimal subsegmental atelectasis at the lung bases.  IMPRESSION: No active cardiopulmonary disease.   Electronically Signed   By: Marybelle Killings M.D.   On: 06/27/2015  17:15   I have personally reviewed and evaluated these images and lab results as part of my medical decision-making.   EKG Interpretation None      MDM   Final diagnoses:  Sickle cell disease with crisis [facility])    Patient followed by Dr. Zigmund Daniel at the sickle cell clinic. Patient contacted her today saying that she continue to have trouble over the weekend after evaluation here on Friday pain was improving when she left but then it started escalate over the weekend. Still having pain in the chest area and bilateral legs. Patient is showing evidence of crisis. [place] cause not clear. Patient the without any acute illness that she is aware of. Patient referred in now for admission for pain control. Discussed with hospitalist they will admit. Patient's chest x-rays negative. No fevers. Patient nontoxic.    Fredia Sorrow, MD 06/27/15 [phone]

## 2015-06-28 LAB — CBC
HEMATOCRIT: 18 % — AB (ref 36.0–46.0)
Hemoglobin: 6.8 g/dL — CL (ref 12.0–15.0)
MCH: 42.8 pg — ABNORMAL HIGH (ref 26.0–34.0)
MCHC: 37.8 g/dL — AB (ref 30.0–36.0)
MCV: 113.2 fL — ABNORMAL HIGH (ref 78.0–100.0)
Platelets: 110 10*3/uL — ABNORMAL LOW (ref 150–400)
RBC: 1.59 MIL/uL — ABNORMAL LOW (ref 3.87–5.11)
RDW: 17.1 % — ABNORMAL HIGH (ref 11.5–15.5)
WBC: 6.6 10*3/uL (ref 4.0–10.5)

## 2015-06-28 LAB — COMPREHENSIVE METABOLIC PANEL
ALBUMIN: 2.8 g/dL — AB (ref 3.5–5.0)
ALK PHOS: 115 U/L (ref 38–126)
ALT: 29 U/L (ref 14–54)
ANION GAP: 3 — AB (ref 5–15)
AST: 70 U/L — ABNORMAL HIGH (ref 15–41)
BUN: 8 mg/dL (ref 6–20)
CALCIUM: 7.9 mg/dL — AB (ref 8.9–10.3)
CO2: 26 mmol/L (ref 22–32)
Chloride: 110 mmol/L (ref 101–111)
Creatinine, Ser: 0.43 mg/dL — ABNORMAL LOW (ref 0.44–1.00)
GFR calc Af Amer: >60 mL/min (ref >60)
GFR calc non Af Amer: >60 mL/min (ref >60)
GLUCOSE: 104 mg/dL — AB (ref 65–99)
Potassium: 3.6 mmol/L (ref 3.5–5.1)
Sodium: 139 mmol/L (ref 135–145)
TOTAL PROTEIN: 7 g/dL (ref 6.5–8.1)
Total Bilirubin: 6.6 mg/dL — ABNORMAL HIGH (ref 0.3–1.2)

## 2015-06-28 LAB — URINALYSIS, ROUTINE W REFLEX MICROSCOPIC
GLUCOSE, UA: NEGATIVE mg/dL
Ketones, ur: NEGATIVE mg/dL
LEUKOCYTES UA: NEGATIVE
Nitrite: NEGATIVE
PH: 6 (ref 5.0–8.0)
Protein, ur: 100 mg/dL — AB
Specific Gravity, Urine: 1.02 (ref 1.005–1.030)
Urobilinogen, UA: 2 mg/dL — ABNORMAL HIGH (ref 0.0–1.0)

## 2015-06-28 LAB — RETICULOCYTES
RBC.: 1.59 MIL/uL — ABNORMAL LOW (ref 3.87–5.11)
RETIC COUNT ABSOLUTE: 271.9 10*3/uL — AB (ref 19.0–186.0)
Retic Ct Pct: 17.1 % — ABNORMAL HIGH (ref 0.4–3.1)

## 2015-06-28 LAB — URINE MICROSCOPIC-ADD ON

## 2015-06-28 LAB — LACTATE DEHYDROGENASE: LDH: 323 U/L — AB (ref 98–192)

## 2015-06-28 MED ORDER — HYDROMORPHONE 0.3 MG/ML IV SOLN: INTRAVENOUS | Status: DC

## 2015-06-28 MED ORDER — DIPHENHYDRAMINE HCL 50 MG/ML IJ SOLN
12.5000 mg | Freq: Four times a day (QID) | INTRAMUSCULAR | Status: DC | PRN
  Administered 2015-06-29 – 2015-07-03 (×10): 12.5 mg via INTRAVENOUS
  Filled 2015-06-28 (×11): qty 1

## 2015-06-28 MED ORDER — KETOROLAC TROMETHAMINE 15 MG/ML IJ SOLN
15.0000 mg | Freq: Four times a day (QID) | INTRAMUSCULAR | Status: DC | PRN
  Administered 2015-06-28 – 2015-07-01 (×6): 15 mg via INTRAVENOUS
  Filled 2015-06-28 (×6): qty 1

## 2015-06-28 MED ORDER — ONDANSETRON HCL 4 MG/2ML IJ SOLN
4.0000 mg | Freq: Four times a day (QID) | INTRAMUSCULAR | Status: DC | PRN
  Administered 2015-06-30 – 2015-07-03 (×3): 4 mg via INTRAVENOUS
  Filled 2015-06-28 (×3): qty 2

## 2015-06-28 MED ORDER — NALOXONE HCL 0.4 MG/ML IJ SOLN: 0.4000 mg | INTRAMUSCULAR | Status: DC | PRN

## 2015-06-28 MED ORDER — SODIUM CHLORIDE 0.9 % IJ SOLN: 9.0000 mL | INTRAMUSCULAR | Status: DC | PRN

## 2015-06-28 MED ORDER — HYDROMORPHONE 0.3 MG/ML IV SOLN
INTRAVENOUS | Status: DC
  Administered 2015-06-28: 12:00:00 via INTRAVENOUS
  Administered 2015-06-29: 5 mg via INTRAVENOUS
  Administered 2015-06-29: 2.77 mg via INTRAVENOUS
  Administered 2015-06-29: 20:00:00 via INTRAVENOUS
  Administered 2015-06-29: 2.9 mg via INTRAVENOUS
  Administered 2015-06-29: 1 mg via INTRAVENOUS
  Administered 2015-06-30 (×4): via INTRAVENOUS
  Administered 2015-06-30: 6.67 mg via INTRAVENOUS
  Administered 2015-06-30: 6.99 mg via INTRAVENOUS
  Administered 2015-07-01: 11.42 mg via INTRAVENOUS
  Administered 2015-07-01 (×2): via INTRAVENOUS
  Administered 2015-07-01: 2.65 mg via INTRAVENOUS
  Administered 2015-07-01: 06:00:00 via INTRAVENOUS
  Administered 2015-07-01: 0.3 mg via INTRAVENOUS
  Administered 2015-07-02: 8.16 mg via INTRAVENOUS
  Administered 2015-07-02: 4 mg via INTRAVENOUS
  Administered 2015-07-02 (×4): via INTRAVENOUS
  Administered 2015-07-03: 3.74 mg via INTRAVENOUS
  Administered 2015-07-03: 07:00:00 via INTRAVENOUS
  Filled 2015-06-28 (×16): qty 25

## 2015-06-28 MED ORDER — DIPHENHYDRAMINE HCL 12.5 MG/5ML PO ELIX: 12.5000 mg | ORAL_SOLUTION | Freq: Four times a day (QID) | ORAL | Status: DC | PRN

## 2015-06-28 NOTE — Care Management Note (Signed)
Case Management Note  Patient Details  Name: Dawn [name] MRN: [id number] Date of Birth: 1980-11-02  Subjective/Objective:                  Pt admitted from home with sickle cell crisis. Pt lives with her son and will return home at discharge.  Pt is independent with ADL's.  Action/Plan: No CM needs noted.  Expected Discharge Date:                  Expected Discharge Plan:  Home/Self Care  In-House Referral:  NA  Discharge planning Services  CM Consult  Post Acute Care Choice:  NA Choice offered to:  NA  DME Arranged:    DME Agency:     HH Arranged:    HH Agency:     Status of Service:  Completed, signed off  Medicare Important Message Given:    Date Medicare IM Given:    Medicare IM give by:    Date Additional Medicare IM Given:    Additional Medicare Important Message give by:     If discussed at [place] of Stay Meetings, dates discussed:    Additional Comments:  Joylene Draft, RN 06/28/2015, 3:32 PM

## 2015-06-28 NOTE — Progress Notes (Signed)
[facility] PROGRESS NOTE  Dawn [name] [id number]:[id number] DOB: Jul 07, 1981 DOA: 06/27/2015 PCP: [name],[name], MD  Assessment/Plan: Acute sickle cell pain crisis Patient presenting with bilateral leg pains and chest pain for the past few days. Home pain medications did not relieve her symptoms. Patient started on when necessary Dilaudid and oxycodone for pain on admission which did not relieve her symptoms. I started her on Dilaudid PCA this morning along with when necessary IV Toradol. She was hospitalized in May this year with sickle cell pain crisis and required Dilaudid PCA with good response. --Monitor LDH -Continue O2 via nasal cannula. Monitor for sedation. Continue Benadryl when necessary for itching. Supportive care with IV fluids and antiemetics. I will add when necessary bowel regimen. -Patient follows with Dr. Zigmund Daniel as outpatient in the sickle cell clinic. I have advised her that it would be better for her to go to [facility] ED when she has sickle cell pain crisis in future so that when she gets admitted there the sickle cell team can follow her as inpatient. .  Anemia Secondary to her underlying sickle cell disease. Drop in hemoglobin to 6.8 from her baseline of around 8. Patient is asymptomatic. 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 Patient has tolerated lower levels of hemoglobin in the past.  Chest pain Secondary to sickle cell pain crisis. Monitor on telemetry.   DVT prophylaxis: Subcutaneous Lovenox Diet: Regular  Code Status: Full code Family Communication: None at bedside Disposition Plan: Home once symptoms improve.   Consultants:  None  Procedures:  None  Antibiotics:  None  HPI/Subjective: Patient seen and examined. Still has 7/10 pain in her legs and chest , minimally improved with oxycodone and when necessary IV Dilaudid.  Objective: Filed Vitals:   06/28/15 1200  BP:   Pulse:   Temp:   Resp: 13    Intake/Output Summary (Last 24 hours) at 06/28/15 1334 Last data filed at 06/28/15 0852  Gross per 24 hour  Intake 1916.67 ml  Output    200 ml  Net 1716.67 ml   Filed Weights   06/27/15 1549 06/27/15 2009  Weight: 70.761 kg (156 lb) 73.437 kg (161 lb 14.4 oz)    Exam:   General:  Middle aged female in no acute distress  HEENT: Pallor present, icterus present, moist oral mucosa, supple neck  Chest: Clear to auscultation bilaterally, no added sounds  Cardiovascular: Normal S1 and S2, no murmurs rub or gallop  GI: Soft, nondistended, nontender, bowel sounds present  Musculoskeletal: Warm, tenderness on pressure over bilateral thighs  CNS: Alert and oriented  *   Data Reviewed: Basic Metabolic Panel:  Recent Labs Lab 06/24/15 1504 06/27/15 1606 06/28/15 0555  NA 141 138 139  K 3.6 3.3* 3.6  CL 109 109 110  CO2 25 22 26   GLUCOSE 90 86 104*  BUN 7 9 8   CREATININE 0.41* 0.60 0.43*  CALCIUM 8.3* 8.5* 7.9*   Liver Function Tests:  Recent Labs Lab 06/24/15 1504 06/28/15 0555  AST 65* 70*  ALT 26 29  ALKPHOS 116 115  BILITOT 7.1* 6.6*  PROT 7.7 7.0  ALBUMIN 3.1* 2.8*   No results for input(s): LIPASE, AMYLASE in the last 168 hours. No results for input(s): AMMONIA in the last 168 hours. CBC:  Recent Labs Lab 06/24/15 1504 06/27/15 1606 06/28/15 0555  WBC 6.2 5.9 6.6  NEUTROABS 2.5 2.5  --  HGB 8.0* 8.2* 6.8*  HCT 21.7* 21.9* 18.0*  MCV 112.4* 111.7* 113.2*  PLT 161 141* 110*   Cardiac Enzymes: No results for input(s): CKTOTAL, CKMB, CKMBINDEX, TROPONINI in the last 168 hours. BNP (last 3 results) No results for input(s): BNP in the last 8760 hours.  ProBNP (last 3 results) No results for input(s): PROBNP in the last 8760 hours.  CBG: No results for input(s): GLUCAP in the last 168 hours.  No results found for this or any previous visit (from the past 240 hour(s)).   Studies: Dg  Chest 2 View  06/27/2015   CLINICAL DATA:  Chest pain  EXAM: CHEST  2 VIEW  COMPARISON:  06/24/2015  FINDINGS: Heart is upper normal in size. Clear lungs. No pleural effusion. No pneumothorax. No consolidation. Minimal subsegmental atelectasis at the lung bases.  IMPRESSION: No active cardiopulmonary disease.   Electronically Signed   By: Marybelle Killings M.D.   On: 06/27/2015 17:15    Scheduled Meds: . sodium chloride   Intravenous STAT  . enoxaparin (LOVENOX) injection  40 mg Subcutaneous Q24H  . folic acid  1 mg Oral Daily  . HYDROmorphone PCA 0.3 mg/mL   Intravenous 6 times per day  . hydroxyurea  1,000 mg Oral Daily  . loratadine  10 mg Oral Daily  . [START ON 07/03/2015] Vitamin D (Ergocalciferol)  50,000 Units Oral Q7 days   Continuous Infusions: . sodium chloride 100 mL/hr at 06/28/15 1213      Time spent: 25 minutes    Monico Sudduth, [place]  [facility] Pager [phone]. If 7PM-7AM, please contact night-coverage at [email or web address], password [facility] 06/28/2015, 1:34 PM  LOS: 1 day

## 2015-06-29 LAB — CBC
HCT: 20.3 % — ABNORMAL LOW (ref 36.0–46.0)
Hemoglobin: 7.5 g/dL — ABNORMAL LOW (ref 12.0–15.0)
MCH: 41.7 pg — ABNORMAL HIGH (ref 26.0–34.0)
MCHC: 36.9 g/dL — AB (ref 30.0–36.0)
MCV: 112.8 fL — ABNORMAL HIGH (ref 78.0–100.0)
PLATELETS: 117 10*3/uL — AB (ref 150–400)
RBC: 1.8 MIL/uL — ABNORMAL LOW (ref 3.87–5.11)
RDW: 18.6 % — AB (ref 11.5–15.5)
WBC: 5.8 10*3/uL (ref 4.0–10.5)

## 2015-06-29 NOTE — Progress Notes (Signed)
[facility] PROGRESS NOTE  Dawn [name] [id number] DOB: [birth year] DOA: 06/27/2015 PCP: [name],[name], MD  Assessment/Plan: Sickle Cell Anemia with Acute Vaso-occlusive Crisis -Pain is improved today, altho still present. -She believes she needs 1 more day on the dilaudid PCA. -Will attempt to transition to oral meds in am.  Code Status: Full Code Family Communication: Patient only  Disposition Plan: Home when ready; anticipate 48 hours   Consultants:  None   Antibiotics:  None   Subjective: Still with pain in chest and back, less so in legs.  Objective: Filed Vitals:   06/29/15 0400 06/29/15 0600 06/29/15 1049 06/29/15 1137  BP:  123/67 116/73   Pulse:  98 97   Temp:  98.8 F (37.1 C) 98.9 F (37.2 C)   TempSrc:  Oral Oral   Resp: 20 17 14 21   Height:      Weight:      SpO2: 100% 98% 98% 100%    Intake/Output Summary (Last 24 hours) at 06/29/15 1337 Last data filed at 06/29/15 1252  Gross per 24 hour  Intake   1578 ml  Output    800 ml  Net    778 ml   Filed Weights   06/27/15 1549 06/27/15 2009  Weight: 70.761 kg (156 lb) 73.437 kg (161 lb 14.4 oz)    Exam:   General:  AA Ox3  Cardiovascular: RRR  Respiratory: CTA B  Abdomen: S/NT/ND/+BS  Extremities: no C/C/E   Neurologic:  Non-focal  Data Reviewed: Basic Metabolic Panel:  Recent Labs Lab 06/24/15 1504 06/27/15 1606 06/28/15 0555  NA 141 138 139  K 3.6 3.3* 3.6  CL 109 109 110  CO2 25 22 26   GLUCOSE 90 86 104*  BUN 7 9 8   CREATININE 0.41* 0.60 0.43*  CALCIUM 8.3* 8.5* 7.9*   Liver Function Tests:  Recent Labs Lab 06/24/15 1504 06/28/15 0555  AST 65* 70*  ALT 26 29  ALKPHOS 116 115  BILITOT 7.1* 6.6*  PROT 7.7 7.0  ALBUMIN 3.1* 2.8*   No results for input(s): LIPASE, AMYLASE in the last 168 hours. No results for input(s): AMMONIA in the last 168 hours. CBC:  Recent Labs Lab 06/24/15 1504 06/27/15 1606 06/28/15 0555 06/29/15 0631  WBC  6.2 5.9 6.6 5.8  NEUTROABS 2.5 2.5  --   --   HGB 8.0* 8.2* 6.8* 7.5*  HCT 21.7* 21.9* 18.0* 20.3*  MCV 112.4* 111.7* 113.2* 112.8*  PLT 161 141* 110* 117*   Cardiac Enzymes: No results for input(s): CKTOTAL, CKMB, CKMBINDEX, TROPONINI in the last 168 hours. BNP (last 3 results) No results for input(s): BNP in the last 8760 hours.  ProBNP (last 3 results) No results for input(s): PROBNP in the last 8760 hours.  CBG: No results for input(s): GLUCAP in the last 168 hours.  No results found for this or any previous visit (from the past 240 hour(s)).   Studies: Dg Chest 2 View  06/27/2015   CLINICAL DATA:  Chest pain  EXAM: CHEST  2 VIEW  COMPARISON:  06/24/2015  FINDINGS: Heart is upper normal in size. Clear lungs. No pleural effusion. No pneumothorax. No consolidation. Minimal subsegmental atelectasis at the lung bases.  IMPRESSION: No active cardiopulmonary disease.   Electronically Signed   By: Marybelle Killings M.D.   On: 06/27/2015 17:15    Scheduled Meds: . enoxaparin (LOVENOX) injection  40 mg Subcutaneous Q24H  . folic acid  1 mg Oral Daily  . HYDROmorphone PCA  0.3 mg/mL   Intravenous 6 times per day  . hydroxyurea  1,000 mg Oral Daily  . loratadine  10 mg Oral Daily  . [START ON 07/03/2015] Vitamin D (Ergocalciferol)  50,000 Units Oral Q7 days   Continuous Infusions: . sodium chloride 100 mL/hr at 06/29/15 0407    Active Problems:   Sickle cell crisis (HCC)   Sickle cell disease with crisis ([place])    Time spent: 25 minutes. Greater than 50% of this time was spent in direct contact with the patient coordinating care.    Lelon Frohlich  [facility] Pager [phone]  If 7PM-7AM, please contact night-coverage at [email or web address], password [facility] 06/29/2015, 1:37 PM  LOS: 2 days

## 2015-06-30 LAB — BASIC METABOLIC PANEL
Anion gap: 3 — ABNORMAL LOW (ref 5–15)
BUN: 6 mg/dL (ref 6–20)
CALCIUM: 7.6 mg/dL — AB (ref 8.9–10.3)
CO2: 26 mmol/L (ref 22–32)
Chloride: 108 mmol/L (ref 101–111)
Creatinine, Ser: 0.44 mg/dL (ref 0.44–1.00)
GFR calc Af Amer: >60 mL/min (ref >60)
Glucose, Bld: 84 mg/dL (ref 65–99)
POTASSIUM: 3.3 mmol/L — AB (ref 3.5–5.1)
SODIUM: 137 mmol/L (ref 135–145)

## 2015-06-30 LAB — CBC
HEMATOCRIT: 19.7 % — AB (ref 36.0–46.0)
Hemoglobin: 7.3 g/dL — ABNORMAL LOW (ref 12.0–15.0)
MCH: 42.2 pg — ABNORMAL HIGH (ref 26.0–34.0)
MCHC: 37.1 g/dL — ABNORMAL HIGH (ref 30.0–36.0)
MCV: 113.9 fL — ABNORMAL HIGH (ref 78.0–100.0)
PLATELETS: 122 10*3/uL — AB (ref 150–400)
RBC: 1.73 MIL/uL — ABNORMAL LOW (ref 3.87–5.11)
RDW: 19.7 % — AB (ref 11.5–15.5)
WBC: 7.3 10*3/uL (ref 4.0–10.5)

## 2015-06-30 MED ORDER — METHOCARBAMOL 500 MG PO TABS
500.0000 mg | ORAL_TABLET | Freq: Three times a day (TID) | ORAL | Status: DC
  Administered 2015-06-30 – 2015-07-07 (×22): 500 mg via ORAL
  Filled 2015-06-30 (×22): qty 1

## 2015-06-30 MED ORDER — DOCUSATE SODIUM 100 MG PO CAPS
100.0000 mg | ORAL_CAPSULE | Freq: Two times a day (BID) | ORAL | Status: DC
  Administered 2015-06-30 – 2015-07-07 (×11): 100 mg via ORAL
  Filled 2015-06-30 (×12): qty 1

## 2015-06-30 MED ORDER — HYDROMORPHONE HCL 1 MG/ML IJ SOLN
2.0000 mg | Freq: Once | INTRAMUSCULAR | Status: AC
  Administered 2015-06-30: 2 mg via INTRAVENOUS
  Filled 2015-06-30: qty 2

## 2015-06-30 NOTE — Progress Notes (Signed)
[facility] PROGRESS NOTE  Dawn [name] [id number] DOB: 02-Jul-1981 DOA: 06/27/2015 PCP: [name],[name], MD  Assessment/Plan: Sickle Cell Anemia with Acute Vaso-occlusive Crisis -Significant pain this afternoon, moaning and writhing during my exam; tears running down her face. -Will keep dilaudid PCA today, give a 1 time dose of diladid 2 mg. She has also received her toradol today.   Code Status: Full Code Family Communication: Patient only  Disposition Plan: Home when ready; anticipate 48 hours   Consultants:  None   Antibiotics:  None   Subjective: Still with pain in chest and back, less so in legs.  Objective: Filed Vitals:   06/30/15 0547 06/30/15 0800 06/30/15 0957 06/30/15 1200  BP: 142/83  135/83   Pulse: 90  94   Temp: 98.6 F (37 C)  98.7 F (37.1 C)   TempSrc: Oral  Oral   Resp: 18 18 19 15   Height:      Weight:      SpO2: 90% 91% 92% 92%    Intake/Output Summary (Last 24 hours) at 06/30/15 1354 Last data filed at 06/30/15 0937  Gross per 24 hour  Intake   3600 ml  Output      0 ml  Net   3600 ml   Filed Weights   06/27/15 1549 06/27/15 2009  Weight: 70.761 kg (156 lb) 73.437 kg (161 lb 14.4 oz)    Exam:   General:  AA Ox3  Cardiovascular: RRR  Respiratory: CTA B  Abdomen: S/NT/ND/+BS  Extremities: no C/C/E   Neurologic:  Non-focal  Data Reviewed: Basic Metabolic Panel:  Recent Labs Lab 06/24/15 1504 06/27/15 1606 06/28/15 0555 06/30/15 0630  NA 141 138 139 137  K 3.6 3.3* 3.6 3.3*  CL 109 109 110 108  CO2 25 22 26 26   GLUCOSE 90 86 104* 84  BUN 7 9 8 6   CREATININE 0.41* 0.60 0.43* 0.44  CALCIUM 8.3* 8.5* 7.9* 7.6*   Liver Function Tests:  Recent Labs Lab 06/24/15 1504 06/28/15 0555  AST 65* 70*  ALT 26 29  ALKPHOS 116 115  BILITOT 7.1* 6.6*  PROT 7.7 7.0  ALBUMIN 3.1* 2.8*   No results for input(s): LIPASE, AMYLASE in the last 168 hours. No results for input(s): AMMONIA in the last 168  hours. CBC:  Recent Labs Lab 06/24/15 1504 06/27/15 1606 06/28/15 0555 06/29/15 0631 06/30/15 0630  WBC 6.2 5.9 6.6 5.8 7.3  NEUTROABS 2.5 2.5  --   --   --   HGB 8.0* 8.2* 6.8* 7.5* 7.3*  HCT 21.7* 21.9* 18.0* 20.3* 19.7*  MCV 112.4* 111.7* 113.2* 112.8* 113.9*  PLT 161 141* 110* 117* 122*   Cardiac Enzymes: No results for input(s): CKTOTAL, CKMB, CKMBINDEX, TROPONINI in the last 168 hours. BNP (last 3 results) No results for input(s): BNP in the last 8760 hours.  ProBNP (last 3 results) No results for input(s): PROBNP in the last 8760 hours.  CBG: No results for input(s): GLUCAP in the last 168 hours.  No results found for this or any previous visit (from the past 240 hour(s)).   Studies: No results found.  Scheduled Meds: . enoxaparin (LOVENOX) injection  40 mg Subcutaneous Q24H  . folic acid  1 mg Oral Daily  .  HYDROmorphone (DILAUDID) injection  2 mg Intravenous Once  . HYDROmorphone PCA 0.3 mg/mL   Intravenous 6 times per day  . hydroxyurea  1,000 mg Oral Daily  . loratadine  10 mg Oral Daily  . [  START ON 07/03/2015] Vitamin D (Ergocalciferol)  50,000 Units Oral Q7 days   Continuous Infusions: . sodium chloride 100 mL/hr at 06/29/15 2328    Active Problems:   Sickle cell crisis (HCC)   Sickle cell disease with crisis ([place])    Time spent: 25 minutes. Greater than 50% of this time was spent in direct contact with the patient coordinating care.    Lelon Frohlich  [facility] Pager [phone]  If 7PM-7AM, please contact night-coverage at [email or web address], password [facility] 06/30/2015, 1:54 PM  LOS: 3 days

## 2015-06-30 NOTE — Plan of Care (Signed)
Problem: Phase I Progression Outcomes Goal: Pain controlled with appropriate interventions Outcome: Progressing Pt states she is still having chest pain from time to time even after using her PCA pump. Pain has been recorded and trends of decreasing levels are noted.  Goal: OOB as tolerated unless otherwise ordered Outcome: Completed/Met Date Met:  06/30/15 Pt ambulates to bathroom and around room.  Goal: Hemodynamically stable Outcome: Completed/Met Date Met:  06/30/15 See flowsheet

## 2015-07-01 MED ORDER — ACETAMINOPHEN 325 MG PO TABS
650.0000 mg | ORAL_TABLET | Freq: Four times a day (QID) | ORAL | Status: DC | PRN
  Administered 2015-07-01 – 2015-07-04 (×3): 650 mg via ORAL
  Filled 2015-07-01 (×3): qty 2

## 2015-07-01 MED ORDER — KETOROLAC TROMETHAMINE 15 MG/ML IJ SOLN
30.0000 mg | Freq: Four times a day (QID) | INTRAMUSCULAR | Status: AC | PRN
  Administered 2015-07-01 – 2015-07-03 (×7): 30 mg via INTRAVENOUS
  Filled 2015-07-01 (×7): qty 2

## 2015-07-01 NOTE — Progress Notes (Signed)
Notified MD, pt temp was 100.5. Pt complaining of a headache and that her pain is uncontrolled. Pt given Toradol and Tylenol ordered. Will continue to monitor pt.

## 2015-07-01 NOTE — Care Management Note (Signed)
Case Management Note  Patient Details  Name: FARHA DANO MRN: [id number] Date of Birth: 03-Aug-1981  Subjective/Objective:                    Action/Plan:   Expected Discharge Date:                  Expected Discharge Plan:  Home/Self Care  In-House Referral:  NA  Discharge planning Services  CM Consult  Post Acute Care Choice:  NA Choice offered to:  NA  DME Arranged:    DME Agency:     HH Arranged:    HH Agency:     Status of Service:  Completed, signed off  Medicare Important Message Given:    Date Medicare IM Given:    Medicare IM give by:    Date Additional Medicare IM Given:    Additional Medicare Important Message give by:     If discussed at [place] of Stay Meetings, dates discussed:    Additional Comments: Pts pain much more controlled on PCA. Weekend discharge possible. No CM needs noted. Christinia Gully Barry, RN 07/01/2015, 1:16 PM

## 2015-07-01 NOTE — Progress Notes (Signed)
[facility] PROGRESS NOTE  DAISEE CENTNER OFB:[id number] DOB: 08-07-1981 DOA: 06/27/2015 PCP: [name],[name], MD  Assessment/Plan: Sickle Cell Anemia with Acute Vaso-occlusive Crisis -Pain improved as compared to yesterday. -Will keep dilaudid PCA today and toradol with hopes of transitioning to PO meds in am.  Code Status: Full Code Family Communication: Patient only  Disposition Plan: Home when ready; anticipate 48 hours   Consultants:  None   Antibiotics:  None   Subjective: Pain improved altho still present.  Objective: Filed Vitals:   07/01/15 1009 07/01/15 1034 07/01/15 1038 07/01/15 1424  BP:    135/78  Pulse:    97  Temp:    98.8 F (37.1 C)  TempSrc:    Oral  Resp:  16 18 17   Height:      Weight:      SpO2: 100% 94%  96%    Intake/Output Summary (Last 24 hours) at 07/01/15 1456 Last data filed at 07/01/15 1425  Gross per 24 hour  Intake 1792.92 ml  Output      0 ml  Net 1792.92 ml   Filed Weights   06/27/15 1549 06/27/15 2009  Weight: 70.761 kg (156 lb) 73.437 kg (161 lb 14.4 oz)    Exam:   General:  AA Ox3  Cardiovascular: RRR  Respiratory: CTA B  Abdomen: S/NT/ND/+BS  Extremities: no C/C/E   Neurologic:  Non-focal  Data Reviewed: Basic Metabolic Panel:  Recent Labs Lab 06/24/15 1504 06/27/15 1606 06/28/15 0555 06/30/15 0630  NA 141 138 139 137  K 3.6 3.3* 3.6 3.3*  CL 109 109 110 108  CO2 25 22 26 26   GLUCOSE 90 86 104* 84  BUN 7 9 8 6   CREATININE 0.41* 0.60 0.43* 0.44  CALCIUM 8.3* 8.5* 7.9* 7.6*   Liver Function Tests:  Recent Labs Lab 06/24/15 1504 06/28/15 0555  AST 65* 70*  ALT 26 29  ALKPHOS 116 115  BILITOT 7.1* 6.6*  PROT 7.7 7.0  ALBUMIN 3.1* 2.8*   No results for input(s): LIPASE, AMYLASE in the last 168 hours. No results for input(s): AMMONIA in the last 168 hours. CBC:  Recent Labs Lab 06/24/15 1504 06/27/15 1606 06/28/15 0555 06/29/15 0631 06/30/15 0630  WBC 6.2 5.9 6.6  5.8 7.3  NEUTROABS 2.5 2.5  --   --   --   HGB 8.0* 8.2* 6.8* 7.5* 7.3*  HCT 21.7* 21.9* 18.0* 20.3* 19.7*  MCV 112.4* 111.7* 113.2* 112.8* 113.9*  PLT 161 141* 110* 117* 122*   Cardiac Enzymes: No results for input(s): CKTOTAL, CKMB, CKMBINDEX, TROPONINI in the last 168 hours. BNP (last 3 results) No results for input(s): BNP in the last 8760 hours.  ProBNP (last 3 results) No results for input(s): PROBNP in the last 8760 hours.  CBG: No results for input(s): GLUCAP in the last 168 hours.  No results found for this or any previous visit (from the past 240 hour(s)).   Studies: No results found.  Scheduled Meds: . docusate sodium  100 mg Oral BID  . enoxaparin (LOVENOX) injection  40 mg Subcutaneous Q24H  . folic acid  1 mg Oral Daily  . HYDROmorphone PCA 0.3 mg/mL   Intravenous 6 times per day  . hydroxyurea  1,000 mg Oral Daily  . loratadine  10 mg Oral Daily  . methocarbamol  500 mg Oral TID  . [START ON 07/03/2015] Vitamin D (Ergocalciferol)  50,000 Units Oral Q7 days   Continuous Infusions: . sodium chloride 125 mL/hr  at 07/01/15 3086    Active Problems:   Sickle cell crisis (HCC)   Sickle cell disease with crisis ([place])    Time spent: 15 minutes. Greater than 50% of this time was spent in direct contact with the patient coordinating care.    Lelon Frohlich  [facility] Pager [phone]  If 7PM-7AM, please contact night-coverage at [email or web address], password [facility] 07/01/2015, 2:56 PM  LOS: 4 days

## 2015-07-01 NOTE — Care Management Important Message (Signed)
Important Message  Patient Details  Name: ZAMIYA DILLARD MRN: [id number] Date of Birth: 08-Nov-1980   Medicare Important Message Given:  Yes-second notification given    Joylene Draft, RN 07/01/2015, 1:18 PM

## 2015-07-02 ENCOUNTER — Inpatient Hospital Stay (HOSPITAL_COMMUNITY): Payer: BLUE CROSS/BLUE SHIELD

## 2015-07-02 LAB — CBC
HCT: 17.8 % — ABNORMAL LOW (ref 36.0–46.0)
HEMOGLOBIN: 6.4 g/dL — AB (ref 12.0–15.0)
MCH: 40.8 pg — ABNORMAL HIGH (ref 26.0–34.0)
MCHC: 36 g/dL (ref 30.0–36.0)
MCV: 113.4 fL — ABNORMAL HIGH (ref 78.0–100.0)
PLATELETS: ADEQUATE 10*3/uL (ref 150–400)
RBC: 1.57 MIL/uL — ABNORMAL LOW (ref 3.87–5.11)
RDW: 23.2 % — AB (ref 11.5–15.5)
WBC: 11.2 10*3/uL — ABNORMAL HIGH (ref 4.0–10.5)

## 2015-07-02 NOTE — Progress Notes (Signed)
Patients O2 sats decrease to 83% when not on O2 by [place] and getting up to BR.  When on 2L O2, sats increase to 96%.

## 2015-07-02 NOTE — Progress Notes (Signed)
Received a critical Hemoglobin of 6.4.  MD notified.

## 2015-07-02 NOTE — Progress Notes (Signed)
[facility] PROGRESS NOTE  Dawn [name] GEX:[id number] DOB: 1981-07-31 DOA: 06/27/2015 PCP: [name],[name], MD  Assessment/Plan: Sickle Cell Anemia with Acute Vaso-occlusive Crisis -Pain improved as compared to yesterday. -Will keep dilaudid PCA today and toradol with hopes of transitioning to PO meds in am.  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 -NSL IVF.  Code Status: Full Code Family Communication: Patient only  Disposition Plan: Home when ready; anticipate 48 hours   Consultants:  None   Antibiotics:  None   Subjective: Pain improved altho still present.  Objective: Filed Vitals:   07/02/15 0849 07/02/15 0905 07/02/15 1200 07/02/15 1236  BP:  129/70    Pulse:  96    Temp:  98.7 F (37.1 C)    TempSrc:      Resp: 20 20 18 23   Height:      Weight:      SpO2: 90% 92% 90% 91%    Intake/Output Summary (Last 24 hours) at 07/02/15 1403 Last data filed at 07/02/15 0900  Gross per 24 hour  Intake   1200 ml  Output      0 ml  Net   1200 ml   Filed Weights   06/27/15 1549 06/27/15 2009  Weight: 70.761 kg (156 lb) 73.437 kg (161 lb 14.4 oz)    Exam:   General:  AA Ox3  Cardiovascular: RRR  Respiratory: CTA B  Abdomen: S/NT/ND/+BS  Extremities: no C/C/E   Neurologic:  Non-focal  Data Reviewed: Basic Metabolic Panel:  Recent Labs Lab 06/27/15 1606 06/28/15 0555 06/30/15 0630  NA 138 139 137  K 3.3* 3.6 3.3*  CL 109 110 108  CO2 22 26 26   GLUCOSE 86 104* 84  BUN 9 8 6   CREATININE 0.60 0.43* 0.44  CALCIUM 8.5* 7.9* 7.6*   Liver Function Tests:  Recent Labs Lab 06/28/15 0555  AST 70*  ALT 29  ALKPHOS 115  BILITOT 6.6*  PROT 7.0  ALBUMIN 2.8*   No results for input(s): LIPASE, AMYLASE in the last 168 hours. No results for input(s): AMMONIA in the last 168 hours. CBC:  Recent Labs Lab 06/27/15 1606 06/28/15 0555 06/29/15 0631  06/30/15 0630 07/02/15 0612  WBC 5.9 6.6 5.8 7.3 11.2*  NEUTROABS 2.5  --   --   --   --   HGB 8.2* 6.8* 7.5* 7.3* 6.4*  HCT 21.9* 18.0* 20.3* 19.7* 17.8*  MCV 111.7* 113.2* 112.8* 113.9* 113.4*  PLT 141* 110* 117* 122* PLATELET CLUMPS NOTED ON SMEAR, COUNT APPEARS ADEQUATE   Cardiac Enzymes: No results for input(s): CKTOTAL, CKMB, CKMBINDEX, TROPONINI in the last 168 hours. BNP (last 3 results) No results for input(s): BNP in the last 8760 hours.  ProBNP (last 3 results) No results for input(s): PROBNP in the last 8760 hours.  CBG: No results for input(s): GLUCAP in the last 168 hours.  No results found for this or any previous visit (from the past 240 hour(s)).   Studies: Dg Chest Port 1 View  07/02/2015   CLINICAL DATA:  Hypoxia.  Chest pain.  Sickle cell disease.  EXAM: PORTABLE CHEST 1 VIEW  COMPARISON:  06/27/2015 chest radiograph.  FINDINGS: There is mild enlargement of the cardiac silhouette, increased in the interval. Otherwise stable and normal mediastinal contour. No pneumothorax. There is slight blunting in the right costophrenic angle, cannot exclude a small right pleural effusion. No left pleural effusion. There is mild to moderate pulmonary  edema. No focal lung consolidation to suggest a pneumonia.  IMPRESSION: 1. Mild enlargement of the cardiac silhouette, increased in the interval. A pericardial effusion cannot be excluded. 2. Mild to moderate pulmonary edema, suggesting mild-to-moderate congestive heart failure. 3. Slight blunting of the right costophrenic angle, cannot exclude a small right pleural effusion.   Electronically Signed   By: Ilona Sorrel M.D.   On: 07/02/2015 11:54    Scheduled Meds: . docusate sodium  100 mg Oral BID  . enoxaparin (LOVENOX) injection  40 mg Subcutaneous Q24H  . folic acid  1 mg Oral Daily  . HYDROmorphone PCA 0.3 mg/mL   Intravenous 6 times per day  . hydroxyurea  1,000 mg Oral Daily  . loratadine  10 mg Oral Daily  . methocarbamol   500 mg Oral TID  . [START ON 07/03/2015] Vitamin D (Ergocalciferol)  50,000 Units Oral Q7 days   Continuous Infusions: . sodium chloride 125 mL/hr at 07/02/15 2778    Active Problems:   Sickle cell crisis (HCC)   Sickle cell disease with crisis ([place])    Time spent: 25 minutes. Greater than 50% of this time was spent in direct contact with the patient coordinating care.    Lelon Frohlich  [facility] Pager [phone]  If 7PM-7AM, please contact night-coverage at [email or web address], password [facility] 07/02/2015, 2:03 PM  LOS: 5 days

## 2015-07-03 MED ORDER — HYDROMORPHONE HCL 1 MG/ML IJ SOLN
0.5000 mg | INTRAMUSCULAR | Status: DC | PRN
  Administered 2015-07-03 – 2015-07-07 (×19): 0.5 mg via INTRAVENOUS
  Filled 2015-07-03 (×19): qty 1

## 2015-07-03 MED ORDER — OXYCODONE HCL 5 MG PO TABS
5.0000 mg | ORAL_TABLET | Freq: Four times a day (QID) | ORAL | Status: DC | PRN
  Administered 2015-07-03: 10 mg via ORAL
  Administered 2015-07-03: 5 mg via ORAL
  Administered 2015-07-04 – 2015-07-07 (×6): 10 mg via ORAL
  Filled 2015-07-03 (×4): qty 2
  Filled 2015-07-03: qty 1
  Filled 2015-07-03 (×3): qty 2

## 2015-07-03 NOTE — Progress Notes (Signed)
[facility] PROGRESS NOTE  Dawn [name] NLG:[id number] DOB: 02/20/1981 DOA: 06/27/2015 PCP: [name],[name], MD  Assessment/Plan: Sickle Cell Anemia with Acute Vaso-occlusive Crisis -Pain improved as compared to yesterday. -Will transition to oxy IR with toradol and IV dilaudid pushes as needed, with IV dilaudid as backup only.  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 -NSL IVF. -Have encouraged mobilization.  Code Status: Full Code Family Communication: Patient only  Disposition Plan: Home when ready; anticipate 24-48 hours   Consultants:  None   Antibiotics:  None   Subjective: Pain improved altho still present.  Objective: Filed Vitals:   07/03/15 0400 07/03/15 0658 07/03/15 0757 07/03/15 0800  BP: 114/70     Pulse: 99     Temp: 98 F (36.7 C)     TempSrc: Oral     Resp: 20 18  16   Height:      Weight:      SpO2: 91% 97% 97% 96%    Intake/Output Summary (Last 24 hours) at 07/03/15 0901 Last data filed at 07/02/15 1811  Gross per 24 hour  Intake    480 ml  Output      0 ml  Net    480 ml   Filed Weights   06/27/15 1549 06/27/15 2009  Weight: 70.761 kg (156 lb) 73.437 kg (161 lb 14.4 oz)    Exam:   General:  AA Ox3  Cardiovascular: RRR  Respiratory: CTA B  Abdomen: S/NT/ND/+BS  Extremities: no C/C/E   Neurologic:  Non-focal  Data Reviewed: Basic Metabolic Panel:  Recent Labs Lab 06/27/15 1606 06/28/15 0555 06/30/15 0630  NA 138 139 137  K 3.3* 3.6 3.3*  CL 109 110 108  CO2 22 26 26   GLUCOSE 86 104* 84  BUN 9 8 6   CREATININE 0.60 0.43* 0.44  CALCIUM 8.5* 7.9* 7.6*   Liver Function Tests:  Recent Labs Lab 06/28/15 0555  AST 70*  ALT 29  ALKPHOS 115  BILITOT 6.6*  PROT 7.0  ALBUMIN 2.8*   No results for input(s): LIPASE, AMYLASE in the last 168 hours. No results for input(s): AMMONIA in the last 168 hours. CBC:  Recent Labs Lab  06/27/15 1606 06/28/15 0555 06/29/15 0631 06/30/15 0630 07/02/15 0612  WBC 5.9 6.6 5.8 7.3 11.2*  NEUTROABS 2.5  --   --   --   --   HGB 8.2* 6.8* 7.5* 7.3* 6.4*  HCT 21.9* 18.0* 20.3* 19.7* 17.8*  MCV 111.7* 113.2* 112.8* 113.9* 113.4*  PLT 141* 110* 117* 122* PLATELET CLUMPS NOTED ON SMEAR, COUNT APPEARS ADEQUATE   Cardiac Enzymes: No results for input(s): CKTOTAL, CKMB, CKMBINDEX, TROPONINI in the last 168 hours. BNP (last 3 results) No results for input(s): BNP in the last 8760 hours.  ProBNP (last 3 results) No results for input(s): PROBNP in the last 8760 hours.  CBG: No results for input(s): GLUCAP in the last 168 hours.  No results found for this or any previous visit (from the past 240 hour(s)).   Studies: Dg Chest Port 1 View  07/02/2015   CLINICAL DATA:  Hypoxia.  Chest pain.  Sickle cell disease.  EXAM: PORTABLE CHEST 1 VIEW  COMPARISON:  06/27/2015 chest radiograph.  FINDINGS: There is mild enlargement of the cardiac silhouette, increased in the interval. Otherwise stable and normal mediastinal contour. No pneumothorax. There is slight blunting in the right costophrenic angle, cannot exclude a small right pleural effusion. No left  pleural effusion. There is mild to moderate pulmonary edema. No focal lung consolidation to suggest a pneumonia.  IMPRESSION: 1. Mild enlargement of the cardiac silhouette, increased in the interval. A pericardial effusion cannot be excluded. 2. Mild to moderate pulmonary edema, suggesting mild-to-moderate congestive heart failure. 3. Slight blunting of the right costophrenic angle, cannot exclude a small right pleural effusion.   Electronically Signed   By: Ilona Sorrel M.D.   On: 07/02/2015 11:54    Scheduled Meds: . docusate sodium  100 mg Oral BID  . enoxaparin (LOVENOX) injection  40 mg Subcutaneous Q24H  . folic acid  1 mg Oral Daily  . hydroxyurea  1,000 mg Oral Daily  . loratadine  10 mg Oral Daily  . methocarbamol  500 mg Oral TID   . Vitamin D (Ergocalciferol)  50,000 Units Oral Q7 days   Continuous Infusions: . sodium chloride 125 mL/hr at 07/02/15 9480    Active Problems:   Sickle cell crisis (HCC)   Sickle cell disease with crisis ([place])    Time spent: 25 minutes. Greater than 50% of this time was spent in direct contact with the patient coordinating care.    Lelon Frohlich  [facility] Pager [phone]  If 7PM-7AM, please contact night-coverage at [email or web address], password [facility] 07/03/2015, 9:01 AM  LOS: 6 days

## 2015-07-03 NOTE — Plan of Care (Signed)
Problem: Problem: Pain Management Progression Goal: Pain controlled Outcome: Progressing PCA discontinued today, pain continue to be 7/10, administering PRN oxy IR and dilaudid as prescribed.

## 2015-07-03 NOTE — Progress Notes (Signed)
Patient ambulated approx 350 feet on RA.  O2 sats dropped to 78%.  Placed on O2 back in room.  Patient tolerated fairly with mild SOB

## 2015-07-04 LAB — CBC
HCT: 18.1 % — ABNORMAL LOW (ref 36.0–46.0)
HEMOGLOBIN: 6.7 g/dL — AB (ref 12.0–15.0)
MCH: 42.9 pg — AB (ref 26.0–34.0)
MCHC: 37 g/dL — ABNORMAL HIGH (ref 30.0–36.0)
MCV: 116 fL — ABNORMAL HIGH (ref 78.0–100.0)
PLATELETS: 121 10*3/uL — AB (ref 150–400)
RBC: 1.56 MIL/uL — AB (ref 3.87–5.11)
RDW: 24 % — ABNORMAL HIGH (ref 11.5–15.5)
WBC: 6.2 10*3/uL (ref 4.0–10.5)

## 2015-07-04 LAB — BASIC METABOLIC PANEL
ANION GAP: 4 — AB (ref 5–15)
BUN: 5 mg/dL — ABNORMAL LOW (ref 6–20)
CALCIUM: 7.7 mg/dL — AB (ref 8.9–10.3)
CO2: 27 mmol/L (ref 22–32)
CREATININE: 0.38 mg/dL — AB (ref 0.44–1.00)
Chloride: 107 mmol/L (ref 101–111)
Glucose, Bld: 93 mg/dL (ref 65–99)
Potassium: 3.5 mmol/L (ref 3.5–5.1)
SODIUM: 138 mmol/L (ref 135–145)

## 2015-07-04 MED ORDER — FUROSEMIDE 10 MG/ML IJ SOLN
40.0000 mg | Freq: Once | INTRAMUSCULAR | Status: AC
  Administered 2015-07-04: 40 mg via INTRAVENOUS
  Filled 2015-07-04: qty 4

## 2015-07-04 MED ORDER — KETOROLAC TROMETHAMINE 30 MG/ML IJ SOLN
30.0000 mg | Freq: Four times a day (QID) | INTRAMUSCULAR | Status: DC
  Administered 2015-07-04 – 2015-07-07 (×13): 30 mg via INTRAVENOUS
  Filled 2015-07-04 (×13): qty 1

## 2015-07-04 NOTE — Progress Notes (Signed)
[facility] PROGRESS NOTE  Dawn [name] QMV:[id number] DOB: June 18, 1981 DOA: 06/27/2015 PCP: [name],[name], MD  Assessment/Plan: Sickle Cell Anemia with Acute Vaso-occlusive Crisis -Pain about the same as compared to yesterday. -Will transition to oxy IR with toradol and IV dilaudid pushes as needed, with IV dilaudid as backup only.  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 -NSL IVF. -Will give lasix 40 mg IV today. -Have encouraged mobilization.  Code Status: Full Code Family Communication: Patient only  Disposition Plan: Home when ready; anticipate 24-48 hours   Consultants:  None   Antibiotics:  None   Subjective: Pain still present. Now feels SOB especially with ambulation.  Objective: Filed Vitals:   07/03/15 0800 07/03/15 1331 07/03/15 2124 07/04/15 0644  BP:  124/81 120/63 131/71  Pulse:  93 96 86  Temp:  98.2 F (36.8 C) 98.8 F (37.1 C) 98.7 F (37.1 C)  TempSrc:   Oral Oral  Resp: 16 16 18 18   Height:      Weight:      SpO2: 96% 93% 93% 95%    Intake/Output Summary (Last 24 hours) at 07/04/15 1338 Last data filed at 07/04/15 1008  Gross per 24 hour  Intake    360 ml  Output      0 ml  Net    360 ml   Filed Weights   06/27/15 1549 06/27/15 2009  Weight: 70.761 kg (156 lb) 73.437 kg (161 lb 14.4 oz)    Exam:   General:  AA Ox3  Cardiovascular: RRR  Respiratory: CTA B  Abdomen: S/NT/ND/+BS  Extremities: no C/C/E   Neurologic:  Non-focal  Data Reviewed: Basic Metabolic Panel:  Recent Labs Lab 06/27/15 1606 06/28/15 0555 06/30/15 0630 07/04/15 0542  NA 138 139 137 138  K 3.3* 3.6 3.3* 3.5  CL 109 110 108 107  CO2 22 26 26 27   GLUCOSE 86 104* 84 93  BUN 9 8 6  5*  CREATININE 0.60 0.43* 0.44 0.38*  CALCIUM 8.5* 7.9* 7.6* 7.7*   Liver Function Tests:  Recent Labs Lab 06/28/15 0555  AST 70*  ALT 29  ALKPHOS 115  BILITOT 6.6*   PROT 7.0  ALBUMIN 2.8*   No results for input(s): LIPASE, AMYLASE in the last 168 hours. No results for input(s): AMMONIA in the last 168 hours. CBC:  Recent Labs Lab 06/27/15 1606 06/28/15 0555 06/29/15 0631 06/30/15 0630 07/02/15 0612 07/04/15 0542  WBC 5.9 6.6 5.8 7.3 11.2* 6.2  NEUTROABS 2.5  --   --   --   --   --   HGB 8.2* 6.8* 7.5* 7.3* 6.4* 6.7*  HCT 21.9* 18.0* 20.3* 19.7* 17.8* 18.1*  MCV 111.7* 113.2* 112.8* 113.9* 113.4* 116.0*  PLT 141* 110* 117* 122* PLATELET CLUMPS NOTED ON SMEAR, COUNT APPEARS ADEQUATE 121*   Cardiac Enzymes: No results for input(s): CKTOTAL, CKMB, CKMBINDEX, TROPONINI in the last 168 hours. BNP (last 3 results) No results for input(s): BNP in the last 8760 hours.  ProBNP (last 3 results) No results for input(s): PROBNP in the last 8760 hours.  CBG: No results for input(s): GLUCAP in the last 168 hours.  No results found for this or any previous visit (from the past 240 hour(s)).   Studies: No results found.  Scheduled Meds: . docusate sodium  100 mg Oral BID  . enoxaparin (LOVENOX) injection  40 mg Subcutaneous Q24H  . folic acid  1 mg Oral Daily  .  hydroxyurea  1,000 mg Oral Daily  . ketorolac  30 mg Intravenous 4 times per day  . loratadine  10 mg Oral Daily  . methocarbamol  500 mg Oral TID  . Vitamin D (Ergocalciferol)  50,000 Units Oral Q7 days   Continuous Infusions: . sodium chloride 125 mL/hr at 07/02/15 4970    Active Problems:   Sickle cell crisis (HCC)   Sickle cell disease with crisis ([place])    Time spent: 25 minutes. Greater than 50% of this time was spent in direct contact with the patient coordinating care.    Lelon Frohlich  [facility] Pager [phone]  If 7PM-7AM, please contact night-coverage at [email or web address], password [facility] 07/04/2015, 1:38 PM  LOS: 7 days

## 2015-07-05 NOTE — Care Management Note (Signed)
Case Management Note  Patient Details  Name: LYBERTI THRUSH MRN: [id number] Date of Birth: Feb 07, 1981  Subjective/Objective:                    Action/Plan:   Expected Discharge Date:                  Expected Discharge Plan:  Home/Self Care  In-House Referral:  NA  Discharge planning Services  CM Consult  Post Acute Care Choice:  NA Choice offered to:  NA  DME Arranged:    DME Agency:     HH Arranged:    HH Agency:     Status of Service:  Completed, signed off  Medicare Important Message Given:  Yes-second notification given Date Medicare IM Given:    Medicare IM give by:    Date Additional Medicare IM Given:    Additional Medicare Important Message give by:     If discussed at [place] of Stay Meetings, dates discussed: 07/05/15   Additional Comments: Pt pain better controlled. SOB better with IV lasix. Will continue to follow for discharge planning needs. Christinia Gully [place], RN 07/05/2015, 3:14 PM

## 2015-07-05 NOTE — Progress Notes (Signed)
[facility] PROGRESS NOTE  Dawn [name] KVQ:[id number] DOB: 11-Mar-1981 DOA: 06/27/2015 PCP: [name],[name], MD  Assessment/Plan: Sickle Cell Anemia with Acute Vaso-occlusive Crisis -Pain improving. -Consider DC home in am if pain under fair control.  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 -NSL IVF. -Was given lasix 40 mg IV with prompt diuresis and no longer feels SOB. -Have encouraged mobilization.  Code Status: Full Code Family Communication: Patient only  Disposition Plan: Home when ready; anticipate 24-48 hours   Consultants:  None   Antibiotics:  None   Subjective: Pain improved. Less SOB.  Objective: Filed Vitals:   07/04/15 0644 07/04/15 1510 07/04/15 2212 07/05/15 0523  BP: 131/71 117/64 122/62 128/70  Pulse: 86 96  90  Temp: 98.7 F (37.1 C) 98.6 F (37 C) 98.3 F (36.8 C) 98.4 F (36.9 C)  TempSrc: Oral Oral Oral Oral  Resp: 18 18 16 17   Height:      Weight:      SpO2: 95% 95% 98% 97%    Intake/Output Summary (Last 24 hours) at 07/05/15 1439 Last data filed at 07/04/15 2213  Gross per 24 hour  Intake    240 ml  Output      0 ml  Net    240 ml   Filed Weights   06/27/15 1549 06/27/15 2009  Weight: 70.761 kg (156 lb) 73.437 kg (161 lb 14.4 oz)    Exam:   General:  AA Ox3  Cardiovascular: RRR  Respiratory: CTA B  Abdomen: S/NT/ND/+BS  Extremities: no C/C/E   Neurologic:  Non-focal  Data Reviewed: Basic Metabolic Panel:  Recent Labs Lab 06/30/15 0630 07/04/15 0542  NA 137 138  K 3.3* 3.5  CL 108 107  CO2 26 27  GLUCOSE 84 93  BUN 6 5*  CREATININE 0.44 0.38*  CALCIUM 7.6* 7.7*   Liver Function Tests: No results for input(s): AST, ALT, ALKPHOS, BILITOT, PROT, ALBUMIN in the last 168 hours. No results for input(s): LIPASE, AMYLASE in the last 168 hours. No results for input(s): AMMONIA in the last 168 hours. CBC:  Recent  Labs Lab 06/29/15 0631 06/30/15 0630 07/02/15 0612 07/04/15 0542  WBC 5.8 7.3 11.2* 6.2  HGB 7.5* 7.3* 6.4* 6.7*  HCT 20.3* 19.7* 17.8* 18.1*  MCV 112.8* 113.9* 113.4* 116.0*  PLT 117* 122* PLATELET CLUMPS NOTED ON SMEAR, COUNT APPEARS ADEQUATE 121*   Cardiac Enzymes: No results for input(s): CKTOTAL, CKMB, CKMBINDEX, TROPONINI in the last 168 hours. BNP (last 3 results) No results for input(s): BNP in the last 8760 hours.  ProBNP (last 3 results) No results for input(s): PROBNP in the last 8760 hours.  CBG: No results for input(s): GLUCAP in the last 168 hours.  No results found for this or any previous visit (from the past 240 hour(s)).   Studies: No results found.  Scheduled Meds: . docusate sodium  100 mg Oral BID  . enoxaparin (LOVENOX) injection  40 mg Subcutaneous Q24H  . folic acid  1 mg Oral Daily  . hydroxyurea  1,000 mg Oral Daily  . ketorolac  30 mg Intravenous 4 times per day  . loratadine  10 mg Oral Daily  . methocarbamol  500 mg Oral TID  . Vitamin D (Ergocalciferol)  50,000 Units Oral Q7 days   Continuous Infusions: . sodium chloride 125 mL/hr at 07/02/15 6433    Active Problems:   Sickle cell crisis (HCC)   Sickle cell  disease with crisis ([place])    Time spent: 25 minutes. Greater than 50% of this time was spent in direct contact with the patient coordinating care.    Lelon Frohlich  [facility] Pager [phone]  If 7PM-7AM, please contact night-coverage at [email or web address], password [facility] 07/05/2015, 2:39 PM  LOS: 8 days

## 2015-07-06 ENCOUNTER — Inpatient Hospital Stay (HOSPITAL_COMMUNITY): Payer: BLUE CROSS/BLUE SHIELD

## 2015-07-06 DIAGNOSIS — J9601 Acute respiratory failure with hypoxia: Secondary | ICD-10-CM

## 2015-07-06 DIAGNOSIS — D696 Thrombocytopenia, unspecified: Secondary | ICD-10-CM | POA: Diagnosis present

## 2015-07-06 DIAGNOSIS — J810 Acute pulmonary edema: Secondary | ICD-10-CM

## 2015-07-06 DIAGNOSIS — E876 Hypokalemia: Secondary | ICD-10-CM

## 2015-07-06 LAB — COMPREHENSIVE METABOLIC PANEL
ALK PHOS: 109 U/L (ref 38–126)
ALT: 24 U/L (ref 14–54)
ANION GAP: 7 (ref 5–15)
AST: 62 U/L — ABNORMAL HIGH (ref 15–41)
Albumin: 2.6 g/dL — ABNORMAL LOW (ref 3.5–5.0)
BILIRUBIN TOTAL: 4.8 mg/dL — AB (ref 0.3–1.2)
BUN: 5 mg/dL — ABNORMAL LOW (ref 6–20)
CALCIUM: 8.3 mg/dL — AB (ref 8.9–10.3)
CO2: 25 mmol/L (ref 22–32)
Chloride: 107 mmol/L (ref 101–111)
Creatinine, Ser: 0.43 mg/dL — ABNORMAL LOW (ref 0.44–1.00)
GFR calc non Af Amer: >60 mL/min (ref >60)
Glucose, Bld: 78 mg/dL (ref 65–99)
POTASSIUM: 3.2 mmol/L — AB (ref 3.5–5.1)
SODIUM: 139 mmol/L (ref 135–145)
TOTAL PROTEIN: 7 g/dL (ref 6.5–8.1)

## 2015-07-06 LAB — CBC WITH DIFFERENTIAL/PLATELET
BASOS ABS: 0 10*3/uL (ref 0.0–0.1)
BASOS PCT: 1 %
Eosinophils Absolute: 0.1 10*3/uL (ref 0.0–0.7)
Eosinophils Relative: 1 %
HCT: 21.2 % — ABNORMAL LOW (ref 36.0–46.0)
Hemoglobin: 7.8 g/dL — ABNORMAL LOW (ref 12.0–15.0)
LYMPHS PCT: 32 %
Lymphs Abs: 1.6 10*3/uL (ref 0.7–4.0)
MCH: 42.9 pg — ABNORMAL HIGH (ref 26.0–34.0)
MCHC: 36.8 g/dL — ABNORMAL HIGH (ref 30.0–36.0)
MCV: 116.5 fL — ABNORMAL HIGH (ref 78.0–100.0)
Monocytes Absolute: 1 10*3/uL (ref 0.1–1.0)
Monocytes Relative: 19 %
NEUTROS ABS: 2.3 10*3/uL (ref 1.7–7.7)
NEUTROS PCT: 47 %
PLATELETS: ADEQUATE 10*3/uL (ref 150–400)
RBC: 1.82 MIL/uL — AB (ref 3.87–5.11)
RDW: 21.4 % — ABNORMAL HIGH (ref 11.5–15.5)
WBC: 5 10*3/uL (ref 4.0–10.5)

## 2015-07-06 MED ORDER — POTASSIUM CHLORIDE CRYS ER 20 MEQ PO TBCR
20.0000 meq | EXTENDED_RELEASE_TABLET | Freq: Two times a day (BID) | ORAL | Status: DC
  Administered 2015-07-06 – 2015-07-07 (×2): 20 meq via ORAL
  Filled 2015-07-06 (×2): qty 1

## 2015-07-06 MED ORDER — POTASSIUM CHLORIDE IN NACL 20-0.45 MEQ/L-% IV SOLN
INTRAVENOUS | Status: DC
  Administered 2015-07-06: 18:00:00 via INTRAVENOUS
  Filled 2015-07-06 (×2): qty 1000

## 2015-07-06 MED ORDER — POTASSIUM CHLORIDE IN NACL 20-0.45 MEQ/L-% IV SOLN
INTRAVENOUS | Status: AC
  Filled 2015-07-06: qty 1000

## 2015-07-06 NOTE — Progress Notes (Signed)
SATURATION QUALIFICATIONS: (This note is used to comply with regulatory documentation for home oxygen)  Patient Saturations on Room Air at Rest = 96%  Patient Saturations on Room Air while Ambulating = 93%  Patient Saturations on x Liters of oxygen while Ambulating = x%  Please briefly explain why patient needs home oxygen: NA

## 2015-07-06 NOTE — Care Management Important Message (Signed)
Important Message  Patient Details  Name: ADREONA BRAND MRN: [id number] Date of Birth: 09-06-1981   Medicare Important Message Given:  Yes-third notification given    Joylene Draft, RN 07/06/2015, 2:52 PM

## 2015-07-06 NOTE — Progress Notes (Addendum)
[facility] PROGRESS NOTE  Dawn [name] WUJ:[id number] DOB: 1981/07/07 DOA: 06/27/2015 PCP: [name],[name], MD    Code Status: Full code Family Communication: Family not available; discuss with patient Disposition Plan: Discharge when clinically appropriate   Consultants:  None  Procedures:  2-D echocardiogram pending  Antibiotics:  None  HPI/Subjective: Patient says that her pain is not as intense. She has less shortness of breath.  Objective: Filed Vitals:   07/06/15 1357  BP: 118/56  Pulse: 60  Temp: 98.7 F (37.1 C)  Resp: 18   oxygen saturation 93% on room air.  Intake/Output Summary (Last 24 hours) at 07/06/15 1631 Last data filed at 07/06/15 1358  Gross per 24 hour  Intake    700 ml  Output      0 ml  Net    700 ml   Filed Weights   06/27/15 1549 06/27/15 2009  Weight: 70.761 kg (156 lb) 73.437 kg (161 lb 14.4 oz)    Exam:   General:  Pleasant alert [name] in no acute distress.  Cardiovascular: S1, S2, with a 1 to 2/6 systolic murmur.  Respiratory: Coarse breath sounds, but no audible crackles or wheezes. Breathing nonlabored.  Abdomen: Positive bowel sounds, soft, nontender, nondistended.  Musculoskeletal/extremities: Mild tenderness of her distal leg muscles; no pedal edema; no hot/red joints.   Data Reviewed: Basic Metabolic Panel:  Recent Labs Lab 06/30/15 0630 07/04/15 0542 07/06/15 1448  NA 137 138 139  K 3.3* 3.5 3.2*  CL 108 107 107  CO2 26 27 25   GLUCOSE 84 93 78  BUN 6 5* 5*  CREATININE 0.44 0.38* 0.43*  CALCIUM 7.6* 7.7* 8.3*   Liver Function Tests:  Recent Labs Lab 07/06/15 1448  AST 62*  ALT 24  ALKPHOS 109  BILITOT 4.8*  PROT 7.0  ALBUMIN 2.6*   No results for input(s): LIPASE, AMYLASE in the last 168 hours. No results for input(s): AMMONIA in the last 168 hours. CBC:  Recent Labs Lab 06/30/15 0630 07/02/15 0612 07/04/15 0542 07/06/15 1448  WBC 7.3 11.2* 6.2  5.0  NEUTROABS  --   --   --  2.3  HGB 7.3* 6.4* 6.7* 7.8*  HCT 19.7* 17.8* 18.1* 21.2*  MCV 113.9* 113.4* 116.0* 116.5*  PLT 122* PLATELET CLUMPS NOTED ON SMEAR, COUNT APPEARS ADEQUATE 121* PLATELET CLUMPS NOTED ON SMEAR, COUNT APPEARS ADEQUATE   Cardiac Enzymes: No results for input(s): CKTOTAL, CKMB, CKMBINDEX, TROPONINI in the last 168 hours. BNP (last 3 results) No results for input(s): BNP in the last 8760 hours.  ProBNP (last 3 results) No results for input(s): PROBNP in the last 8760 hours.  CBG: No results for input(s): GLUCAP in the last 168 hours.  No results found for this or any previous visit (from the past 240 hour(s)).   Studies: Dg Chest 2 View  07/06/2015  CLINICAL DATA:  Followup pulmonary edema, history of sickle cell disease EXAM: CHEST  2 VIEW COMPARISON:  Multiple prior studies including 07/02/2015 FINDINGS: Moderate cardiac enlargement stable. Vascular pattern normal. Minimal diffuse interstitial prominence decreased when compared to prior study. There is new hazy bibasilar densities. Minimal bilateral pleural effusions. IMPRESSION: 1. Stable enlargement of cardiac silhouette. As previously mentioned, pericardial effusion cannot be excluded on radiography and could be further evaluated with echocardiography or CT scan. 2. Improving pulmonary edema with minimal residual interstitial edema and small bilateral effusions. Electronically Signed   By: Skipper Cliche M.D.   On: 07/06/2015 15:16  Scheduled Meds: . docusate sodium  100 mg Oral BID  . enoxaparin (LOVENOX) injection  40 mg Subcutaneous Q24H  . folic acid  1 mg Oral Daily  . hydroxyurea  1,000 mg Oral Daily  . ketorolac  30 mg Intravenous 4 times per day  . loratadine  10 mg Oral Daily  . methocarbamol  500 mg Oral TID  . Vitamin D (Ergocalciferol)  50,000 Units Oral Q7 days   Continuous Infusions: . sodium chloride Stopped (07/05/15 1914)   Assessment and plan:  Principal Problem:   Sickle  cell disease with crisis ([place]) Active Problems:   Acute pulmonary edema (HCC)   Sickle cell anemia (HCC)   Hypokalemia   Acute respiratory failure with hypoxia (HCC)   Thrombocytopenia (HCC)    Sickle cell anemia with vaso-occlusive crisis. The patient's hemoglobin was 8.2 on admission. It fell to a nadir of 6.7 on 07/04/15. Follow-up hemoglobin today improved to 7.8 without packed red blood cell transfusion. Symptomatically, she has improved. -We will continue pain management with analgesics; decrease IV fluids as was intended.  Acute pulmonary edema causing acute respiratory failure with hypoxia. Patient's chest x-ray on admission revealed no active cardiopulmonary disease. Patient apparently became hypoxic on 07/02/15. Follow-up chest x-ray on 07/02/15 revealed mild to moderate pulmonary edema. She was given 40 mg of IV Lasix. The intent was to discontinue the IV fluids on 07/05/15. but they were apparently not stop. -Patient's follow-up chest x-ray reveals decreased pulmonary edema. -We will change IV fluids to half-normal saline and run at [facility]. -We'll order 2-D echocardiogram for further evaluation, although, the pulmonary edema was thought to be secondary to vigorous IV fluid infusion. -She appears to be oxygenating better off of supplemental oxygen.  Hypokalemia.  Patient's serum potassium has been slightly low. -Have added potassium to very gentle IV fluids and treat with oral potassium.  Thrombocytopenia and macrocytic anemia. 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 Some of the thrombocytopenia may be secondary to clumping per lab. -I do not see a recent TSH or vitamin B12 level. Will order both for further evaluation.     Time spent: 30 minutes    [facility] Pager [phone]. If 7PM-7AM, please contact night-coverage at [email or web address], password [facility] 07/06/2015, 4:31 PM  LOS: 9  days

## 2015-07-07 ENCOUNTER — Encounter (HOSPITAL_COMMUNITY): Payer: Self-pay | Admitting: Internal Medicine

## 2015-07-07 ENCOUNTER — Inpatient Hospital Stay (HOSPITAL_COMMUNITY): Payer: BLUE CROSS/BLUE SHIELD

## 2015-07-07 DIAGNOSIS — J810 Acute pulmonary edema: Secondary | ICD-10-CM

## 2015-07-07 DIAGNOSIS — I517 Cardiomegaly: Secondary | ICD-10-CM

## 2015-07-07 DIAGNOSIS — E538 Deficiency of other specified B group vitamins: Secondary | ICD-10-CM | POA: Diagnosis present

## 2015-07-07 HISTORY — DX: Cardiomegaly: I51.7

## 2015-07-07 HISTORY — DX: Deficiency of other specified B group vitamins: E53.8

## 2015-07-07 LAB — CBC
HEMATOCRIT: 19.5 % — AB (ref 36.0–46.0)
HEMOGLOBIN: 7.1 g/dL — AB (ref 12.0–15.0)
MCH: 42.8 pg — ABNORMAL HIGH (ref 26.0–34.0)
MCHC: 36.4 g/dL — AB (ref 30.0–36.0)
MCV: 117.5 fL — AB (ref 78.0–100.0)
Platelets: 131 10*3/uL — ABNORMAL LOW (ref 150–400)
RBC: 1.66 MIL/uL — ABNORMAL LOW (ref 3.87–5.11)
RDW: 20.5 % — AB (ref 11.5–15.5)
WBC: 5.2 10*3/uL (ref 4.0–10.5)

## 2015-07-07 LAB — TSH: TSH: 2.339 u[IU]/mL (ref 0.350–4.500)

## 2015-07-07 LAB — COMPREHENSIVE METABOLIC PANEL
ALBUMIN: 2.5 g/dL — AB (ref 3.5–5.0)
ALK PHOS: 113 U/L (ref 38–126)
ALT: 25 U/L (ref 14–54)
AST: 66 U/L — AB (ref 15–41)
Anion gap: 5 (ref 5–15)
BUN: 7 mg/dL (ref 6–20)
CHLORIDE: 106 mmol/L (ref 101–111)
CO2: 25 mmol/L (ref 22–32)
Calcium: 8.2 mg/dL — ABNORMAL LOW (ref 8.9–10.3)
Creatinine, Ser: 0.48 mg/dL (ref 0.44–1.00)
GFR calc non Af Amer: >60 mL/min (ref >60)
GLUCOSE: 88 mg/dL (ref 65–99)
Potassium: 3.3 mmol/L — ABNORMAL LOW (ref 3.5–5.1)
SODIUM: 136 mmol/L (ref 135–145)
Total Bilirubin: 5.7 mg/dL — ABNORMAL HIGH (ref 0.3–1.2)
Total Protein: 6.8 g/dL (ref 6.5–8.1)

## 2015-07-07 LAB — VITAMIN B12: Vitamin B-12: 148 pg/mL — ABNORMAL LOW (ref 180–914)

## 2015-07-07 MED ORDER — POTASSIUM CHLORIDE CRYS ER 20 MEQ PO TBCR: 20.0000 meq | EXTENDED_RELEASE_TABLET | Freq: Every day | ORAL | Status: DC

## 2015-07-07 MED ORDER — POTASSIUM CHLORIDE CRYS ER 20 MEQ PO TBCR: 30.0000 meq | EXTENDED_RELEASE_TABLET | Freq: Two times a day (BID) | ORAL | Status: DC

## 2015-07-07 MED ORDER — CYANOCOBALAMIN 1000 MCG/ML IJ SOLN
1000.0000 ug | Freq: Once | INTRAMUSCULAR | Status: AC
  Administered 2015-07-07: 1000 ug via INTRAMUSCULAR
  Filled 2015-07-07: qty 1

## 2015-07-07 MED ORDER — METHOCARBAMOL 500 MG PO TABS: 500.0000 mg | ORAL_TABLET | Freq: Three times a day (TID) | ORAL | Status: DC | PRN

## 2015-07-07 NOTE — Care Management Note (Signed)
Case Management Note  Patient Details  Name: Dawn [name] MRN: [id number] Date of Birth: [birth year]  Subjective/Objective:                    Action/Plan:   Expected Discharge Date:                  Expected Discharge Plan:  Home/Self Care  In-House Referral:  NA  Discharge planning Services  CM Consult  Post Acute Care Choice:  NA Choice offered to:  NA  DME Arranged:    DME Agency:     HH Arranged:    HH Agency:     Status of Service:  Completed, signed off  Medicare Important Message Given:  Yes-third notification given Date Medicare IM Given:    Medicare IM give by:    Date Additional Medicare IM Given:    Additional Medicare Important Message give by:     If discussed at [place] of Stay Meetings, dates discussed: 07/07/15   Additional Comments:  Joylene Draft, RN 07/07/2015, 2:07 PM

## 2015-07-07 NOTE — Discharge Summary (Signed)
Physician Discharge Summary  Dawn [name] [id number]:[id number] DOB: 1981-09-21 DOA: 06/27/2015  PCP: [name],[name], MD  Admit date: 06/27/2015 Discharge date: 07/07/2015  Time spent: Greater than 30 minutes  Recommendations for Outpatient Follow-up:  1. Recommend rechecking the patient's serum potassium and H/H  2. Recommend monthly IM injections of vitamin B12.  Discharge Diagnoses:  1. Sickle cell disease with crisis. 2. Sickle cell anemia. Hemoglobin 7.1 at the time of discharge. 3. Acute pulmonary edema, thought to be secondary to vigorous IV fluids. 4. Acute hypoxic respiratory failure secondary to pulmonary edema, resolved. 5. Left atrial dilatation per 2-D echo. 6. Thrombocytopenia secondary to platelet clumping 7. Vitamin B12 deficiency. 8. Hypokalemia.  Discharge Condition: Improved.  Diet recommendation: General  Filed Weights   06/27/15 1549 06/27/15 2009  Weight: 70.761 kg (156 lb) 73.437 kg (161 lb 14.4 oz)    History of present illness:  The patient is a 34 year old woman with a history of sickle cell disease, who presented to the emergency department on 06/27/2015 with a chief complaint of pain in her chest and her legs. She was admitted for treatment of sickle cell disease with crisis.   Hospital Course:   Sickle cell anemia with vaso-occlusive crisis. The patient was started on vigorous IV fluids. IV and oral opiates were ordered as needed. Also, IV Toradol was given as needed. Hydroxyurea was continued as well as folic acid. Robaxin was added for symptomatic muscle spasms. The patient's hemoglobin was 8.2 on admission. It fell to a nadir of 6.7 on 07/04/15. Follow-up hemoglobin today improved to 7.1-7.8 without packed red blood cell transfusion. After approximately 1-1/2 weeks in the hospital, she improved clinically and symptomatically. She was discharged on her usual home medications. Acute pulmonary edema causing acute respiratory failure with  hypoxia. Patient's chest x-ray on admission revealed no active cardiopulmonary disease. Patient apparently became hypoxic on 07/02/15. Follow-up chest x-ray on 07/02/15 revealed mild to moderate pulmonary edema. She was given 40 mg of IV Lasix. The intent was to discontinue the IV fluids on 07/05/15. but they were apparently not stopped. -Patient's follow-up chest x-ray revealed decreased pulmonary edema. - IV fluids were eventually changed to half-normal saline and decreased to [facility]. -2-D echocardiogram ordered and revealed an EF of 60-65% with no regional motion abdomen allergies. She was noted to have severe left atrial enlargement. Recommend follow-up of this abnormality with cardiology if clinically appropriate. -Etiology of her pulmonary edema was likely secondary to vigorous IV fluids. -Her oxygen saturations improved and she was oxygenating in the 90s on room air at the time of discharge. Hypokalemia.  Patient's serum potassium had been slightly low. For treatment, potassium was added to gentle IV fluids a day prior to discharge. She was started on oral potassium chloride supplementation. Her serum potassium was still low at discharge, so she was discharged on potassium chloride daily for 7 more days. Recommend follow-up serum potassium in the outpatient setting. Thrombocytopenia and macrocytic anemia. Vitamin B12 deficiency Patient has a history of macrocytosis and thrombocytopenia, both felt to be secondary to sickle cell disease. Some of the thrombocytopenia may be secondary to clumping per lab. Vitamin B12 and TSH were assessed for deficiencies. Her TSH was within normal limits, but her vitamin B12 level was low at 148. She was given a 1000 g IM injection on the day of discharge. Recommend follow-up in the outpatient setting and consideration for monthly vitamin B12 injections.     Procedures:  2-D echocardiogram 07/07/15:Study Conclusions - Left ventricle: The  cavity size was normal.  Wall thickness was normal. Systolic function was normal. The estimated ejection fraction was in the range of 60% to 65%. Wall motion was normal; there were no regional wall motion abnormalities. The study is not technically sufficient to allow evaluation of LV diastolic function. - Mitral valve: Mildly thickened leaflets . There was mild regurgitation. - Left atrium: The atrium was severely dilated. - Right atrium: The atrium was mildly dilated. Central venous pressure (est): 3 mm Hg. - Tricuspid valve: There was mild regurgitation. - Pulmonary arteries: PA peak pressure: 33 mm Hg (S). - Pericardium, extracardiac: A small pericardial effusion was identified posterior to the heart. Impressions: - Normal LV wall thickness with LVEF 60-65%. Indeterminate diastolic function. Severe left atrial enlargement. Mildly thickened mitral leaflets with mild mitral regurgitation. Mildly dilated right atrium. Mild tricuspid regurgitation with PASP 33 mmHg. Small posterior pericardial effusion.  Consultations: None   Discharge Exam: Filed Vitals:   07/07/15 1400  BP: 121/78  Pulse: 92  Temp: 98.3 F (36.8 C)  Resp: 20   oxygen saturation 94% on room air.  General: Pleasant [name] in no acute distress. Cardiovascular: S1, S2, with a 1 to 2/6 systolic murmur. Respiratory: coarse breath sounds, without audible wheezes or crackles. Musculoskeletal: No tenderness of her extremities.  Discharge Instructions   Discharge Instructions    Diet general    Complete by:  As directed      Discharge instructions    Complete by:  As directed   Take medications as prescribed. Discuss monthly Vitamin B 12 injections with Dr. Zigmund Daniel.     Increase activity slowly    Complete by:  As directed           Current Discharge Medication List    START taking these medications   Details  methocarbamol (ROBAXIN) 500 MG tablet Take 1 tablet (500 mg  total) by mouth every 8 (eight) hours as needed for muscle spasms. Qty: 30 tablet, Refills: 0    potassium chloride (K-DUR,KLOR-CON) 20 MEQ tablet Take 1 tablet (20 mEq total) by mouth daily. Qty: 7 tablet, Refills: 0      CONTINUE these medications which have NOT CHANGED   Details  cetirizine (ZYRTEC) 10 MG tablet TAKE 1 TABLET (10 MG TOTAL) BY MOUTH DAILY. Refills: 3    folic acid (FOLVITE) 1 MG tablet Take 1 tablet (1 mg total) by mouth daily. Qty: 30 tablet, Refills: 11    hydroxyurea (HYDREA) 500 MG capsule Take 2 capsules (1,000 mg total) by mouth daily. May take with food to minimize GI side effects. Qty: 60 capsule, Refills: 11   Associated Diagnoses: Hb-SS disease without crisis (HCC)    Oxycodone HCl 10 MG TABS Take 1 tablet (10 mg total) by mouth every 4 (four) hours as needed. Qty: 15 tablet, Refills: 0    promethazine (PHENERGAN) 12.5 MG tablet Take 1 tablet (12.5 mg total) by mouth every 6 (six) hours as needed for nausea. Qty: 12 tablet, Refills: 0   Associated Diagnoses: Hb-SS disease without crisis ([place])    Vitamin D, Ergocalciferol, (DRISDOL) 50000 UNITS CAPS capsule Take 1 capsule (50,000 Units total) by mouth every 7 (seven) days. Takes on Sundays. Qty: 30 capsule, Refills: 11   Associated Diagnoses: Vitamin D deficiency      STOP taking these medications     Influenza vac split quadrivalent PF (FLUARIX) 0.5 ML injection        Allergies  Allergen Reactions  . Desferal [Deferoxamine] Hives  Local reaction on arm only during infusion. Can take with benadryl   . Latex Other (See Comments)    REACTION: Pt experiences a burning sensation on contacted skin areas  . Lisinopril Other (See Comments) and Cough    REACTION: Sore/scratchy throat  . Tape Other (See Comments)    REACTION: Pt. Experiences a burning sensation on contacted skin areas   Follow-up Information    Schedule an appointment as soon as possible for a visit with [name],[name], MD.    Specialty:  Internal Medicine   Why:  To be seen for hospital f/up in 1-2 weeks.   Contact information:   [place] [address] [phone]        The results of significant diagnostics from this hospitalization (including imaging, microbiology, ancillary and laboratory) are listed below for reference.    Significant Diagnostic Studies: Dg Chest 2 View  07/06/2015  CLINICAL DATA:  Followup pulmonary edema, history of sickle cell disease EXAM: CHEST  2 VIEW COMPARISON:  Multiple prior studies including 07/02/2015 FINDINGS: Moderate cardiac enlargement stable. Vascular pattern normal. Minimal diffuse interstitial prominence decreased when compared to prior study. There is new hazy bibasilar densities. Minimal bilateral pleural effusions. IMPRESSION: 1. Stable enlargement of cardiac silhouette. As previously mentioned, pericardial effusion cannot be excluded on radiography and could be further evaluated with echocardiography or CT scan. 2. Improving pulmonary edema with minimal residual interstitial edema and small bilateral effusions. Electronically Signed   By: Skipper Cliche M.D.   On: 07/06/2015 15:16   Dg Chest 2 View  06/27/2015  CLINICAL DATA:  Chest pain EXAM: CHEST  2 VIEW COMPARISON:  06/24/2015 FINDINGS: Heart is upper normal in size. Clear lungs. No pleural effusion. No pneumothorax. No consolidation. Minimal subsegmental atelectasis at the lung bases. IMPRESSION: No active cardiopulmonary disease. Electronically Signed   By: Marybelle Killings M.D.   On: 06/27/2015 17:15   Dg Chest 2 View  06/24/2015  CLINICAL DATA:  Chest pain.  Sickle cell disease. EXAM: CHEST  2 VIEW COMPARISON:  02/02/2015 chest radiograph. FINDINGS: Stable cardiomediastinal silhouette with normal heart size. No pneumothorax. No pleural effusion. Clear lungs. No focal lung consolidation. No pulmonary edema. Cholecystectomy clips are seen in the right upper quadrant of the abdomen. IMPRESSION:  No active disease in the chest. Electronically Signed   By: Ilona Sorrel M.D.   On: 06/24/2015 15:40   Dg Chest Port 1 View  07/02/2015  CLINICAL DATA:  Hypoxia.  Chest pain.  Sickle cell disease. EXAM: PORTABLE CHEST 1 VIEW COMPARISON:  06/27/2015 chest radiograph. FINDINGS: There is mild enlargement of the cardiac silhouette, increased in the interval. Otherwise stable and normal mediastinal contour. No pneumothorax. There is slight blunting in the right costophrenic angle, cannot exclude a small right pleural effusion. No left pleural effusion. There is mild to moderate pulmonary edema. No focal lung consolidation to suggest a pneumonia. IMPRESSION: 1. Mild enlargement of the cardiac silhouette, increased in the interval. A pericardial effusion cannot be excluded. 2. Mild to moderate pulmonary edema, suggesting mild-to-moderate congestive heart failure. 3. Slight blunting of the right costophrenic angle, cannot exclude a small right pleural effusion. Electronically Signed   By: Ilona Sorrel M.D.   On: 07/02/2015 11:54    Microbiology: No results found for this or any previous visit (from the past 240 hour(s)).   Labs: Basic Metabolic Panel:  Recent Labs Lab 07/04/15 0542 07/06/15 1448 07/07/15 0609  NA 138 139 136  K 3.5  3.2* 3.3*  CL 107 107 106  CO2 27 25 25   GLUCOSE 93 78 88  BUN 5* 5* 7  CREATININE 0.38* 0.43* 0.48  CALCIUM 7.7* 8.3* 8.2*   Liver Function Tests:  Recent Labs Lab 07/06/15 1448 07/07/15 0609  AST 62* 66*  ALT 24 25  ALKPHOS 109 113  BILITOT 4.8* 5.7*  PROT 7.0 6.8  ALBUMIN 2.6* 2.5*   No results for input(s): LIPASE, AMYLASE in the last 168 hours. No results for input(s): AMMONIA in the last 168 hours. CBC:  Recent Labs Lab 07/02/15 0612 07/04/15 0542 07/06/15 1448 07/07/15 0609  WBC 11.2* 6.2 5.0 5.2  NEUTROABS  --   --  2.3  --   HGB 6.4* 6.7* 7.8* 7.1*  HCT 17.8* 18.1* 21.2* 19.5*  MCV 113.4* 116.0* 116.5* 117.5*  PLT PLATELET CLUMPS  NOTED ON SMEAR, COUNT APPEARS ADEQUATE 121* PLATELET CLUMPS NOTED ON SMEAR, COUNT APPEARS ADEQUATE 131*   Cardiac Enzymes: No results for input(s): CKTOTAL, CKMB, CKMBINDEX, TROPONINI in the last 168 hours. BNP: BNP (last 3 results) No results for input(s): BNP in the last 8760 hours.  ProBNP (last 3 results) No results for input(s): PROBNP in the last 8760 hours.  CBG: No results for input(s): GLUCAP in the last 168 hours.     Signed:  Aissa Lisowski  [facility] 07/07/2015, 4:22 PM

## 2015-07-07 NOTE — Progress Notes (Signed)
Pt discharged home per Dr. Caryn Section. Pt's IV site D/C and within defined limits. Pt's VSS. Pt provided with discharge instructions, home medication list, and prescriptions. Oswald Hillock, RN

## 2015-07-22 ENCOUNTER — Telehealth: Payer: Self-pay | Admitting: Family Medicine

## 2015-07-22 NOTE — Telephone Encounter (Signed)
Please have patient come in for an appointment. Has not been seen since 01/2015. Needs follow up for refills requested. Thanks!

## 2015-08-05 ENCOUNTER — Encounter (HOSPITAL_COMMUNITY): Payer: Self-pay | Admitting: Emergency Medicine

## 2015-08-05 ENCOUNTER — Emergency Department (HOSPITAL_COMMUNITY): Payer: BLUE CROSS/BLUE SHIELD

## 2015-08-05 ENCOUNTER — Emergency Department (HOSPITAL_COMMUNITY)
Admission: EM | Admit: 2015-08-05 | Discharge: 2015-08-05 | Disposition: A | Discharge status: Home / Self Care | Payer: BLUE CROSS/BLUE SHIELD | Attending: Emergency Medicine | Admitting: Emergency Medicine

## 2015-08-05 DIAGNOSIS — Z8659 Personal history of other mental and behavioral disorders: Secondary | ICD-10-CM | POA: Insufficient documentation

## 2015-08-05 DIAGNOSIS — Z8679 Personal history of other diseases of the circulatory system: Secondary | ICD-10-CM | POA: Insufficient documentation

## 2015-08-05 DIAGNOSIS — Z91040 Latex allergy status: Secondary | ICD-10-CM | POA: Insufficient documentation

## 2015-08-05 DIAGNOSIS — R52 Pain, unspecified: Secondary | ICD-10-CM | POA: Diagnosis present

## 2015-08-05 DIAGNOSIS — D571 Sickle-cell disease without crisis: Secondary | ICD-10-CM | POA: Diagnosis not present

## 2015-08-05 DIAGNOSIS — Z8639 Personal history of other endocrine, nutritional and metabolic disease: Secondary | ICD-10-CM | POA: Diagnosis not present

## 2015-08-05 DIAGNOSIS — N390 Urinary tract infection, site not specified: Secondary | ICD-10-CM

## 2015-08-05 DIAGNOSIS — Z8701 Personal history of pneumonia (recurrent): Secondary | ICD-10-CM | POA: Diagnosis not present

## 2015-08-05 DIAGNOSIS — Z79899 Other long term (current) drug therapy: Secondary | ICD-10-CM | POA: Insufficient documentation

## 2015-08-05 LAB — CBC WITH DIFFERENTIAL/PLATELET
BASOS ABS: 0 10*3/uL (ref 0.0–0.1)
Basophils Relative: 0 %
Eosinophils Absolute: 0.1 10*3/uL (ref 0.0–0.7)
Eosinophils Relative: 1 %
HEMATOCRIT: 22.1 % — AB (ref 36.0–46.0)
HEMOGLOBIN: 8.1 g/dL — AB (ref 12.0–15.0)
LYMPHS PCT: 33 %
Lymphs Abs: 2.5 10*3/uL (ref 0.7–4.0)
MCH: 39.3 pg — ABNORMAL HIGH (ref 26.0–34.0)
MCHC: 36.7 g/dL — AB (ref 30.0–36.0)
MCV: 107.3 fL — AB (ref 78.0–100.0)
MONO ABS: 0.9 10*3/uL (ref 0.1–1.0)
MONOS PCT: 12 %
NEUTROS ABS: 4.1 10*3/uL (ref 1.7–7.7)
NEUTROS PCT: 54 %
Platelets: 205 10*3/uL (ref 150–400)
RBC: 2.06 MIL/uL — ABNORMAL LOW (ref 3.87–5.11)
RDW: 16.1 % — AB (ref 11.5–15.5)
WBC: 7.5 10*3/uL (ref 4.0–10.5)

## 2015-08-05 LAB — URINE MICROSCOPIC-ADD ON

## 2015-08-05 LAB — URINALYSIS, ROUTINE W REFLEX MICROSCOPIC
Glucose, UA: NEGATIVE mg/dL
Ketones, ur: NEGATIVE mg/dL
Nitrite: NEGATIVE
Protein, ur: 100 mg/dL — AB
Specific Gravity, Urine: 1.02 (ref 1.005–1.030)
Urobilinogen, UA: 2 mg/dL — ABNORMAL HIGH (ref 0.0–1.0)
pH: 6.5 (ref 5.0–8.0)

## 2015-08-05 LAB — COMPREHENSIVE METABOLIC PANEL
ALBUMIN: 3.1 g/dL — AB (ref 3.5–5.0)
ALT: 30 U/L (ref 14–54)
ANION GAP: 5 (ref 5–15)
AST: 65 U/L — AB (ref 15–41)
Alkaline Phosphatase: 126 U/L (ref 38–126)
BILIRUBIN TOTAL: 6.9 mg/dL — AB (ref 0.3–1.2)
BUN: 5 mg/dL — AB (ref 6–20)
CO2: 25 mmol/L (ref 22–32)
Calcium: 8.5 mg/dL — ABNORMAL LOW (ref 8.9–10.3)
Chloride: 108 mmol/L (ref 101–111)
Creatinine, Ser: 0.44 mg/dL (ref 0.44–1.00)
GFR calc Af Amer: >60 mL/min (ref >60)
GFR calc non Af Amer: >60 mL/min (ref >60)
GLUCOSE: 121 mg/dL — AB (ref 65–99)
POTASSIUM: 3.3 mmol/L — AB (ref 3.5–5.1)
SODIUM: 138 mmol/L (ref 135–145)
TOTAL PROTEIN: 7.6 g/dL (ref 6.5–8.1)

## 2015-08-05 LAB — RETICULOCYTES
RBC.: 2.06 MIL/uL — AB (ref 3.87–5.11)
RETIC COUNT ABSOLUTE: 395.5 10*3/uL — AB (ref 19.0–186.0)
Retic Ct Pct: 19.2 % — ABNORMAL HIGH (ref 0.4–3.1)

## 2015-08-05 MED ORDER — KETOROLAC TROMETHAMINE 30 MG/ML IJ SOLN
30.0000 mg | Freq: Once | INTRAMUSCULAR | Status: AC
  Administered 2015-08-05: 30 mg via INTRAVENOUS
  Filled 2015-08-05: qty 1

## 2015-08-05 MED ORDER — CEPHALEXIN 500 MG PO CAPS
500.0000 mg | ORAL_CAPSULE | Freq: Once | ORAL | Status: AC
  Administered 2015-08-05: 500 mg via ORAL
  Filled 2015-08-05: qty 1

## 2015-08-05 MED ORDER — SODIUM CHLORIDE 0.9 % IV BOLUS (SEPSIS)
1000.0000 mL | Freq: Once | INTRAVENOUS | Status: AC
  Administered 2015-08-05: 1000 mL via INTRAVENOUS

## 2015-08-05 MED ORDER — CEPHALEXIN 500 MG PO CAPS
500.0000 mg | ORAL_CAPSULE | Freq: Four times a day (QID) | ORAL | Status: DC
Start: 2015-08-05 — End: 2015-08-25

## 2015-08-05 MED ORDER — HYDROMORPHONE HCL 1 MG/ML IJ SOLN
1.0000 mg | Freq: Once | INTRAMUSCULAR | Status: AC
  Administered 2015-08-05: 1 mg via INTRAVENOUS
  Filled 2015-08-05: qty 1

## 2015-08-05 MED ORDER — ONDANSETRON HCL 4 MG/2ML IJ SOLN
4.0000 mg | Freq: Once | INTRAMUSCULAR | Status: AC
  Administered 2015-08-05: 4 mg via INTRAVENOUS
  Filled 2015-08-05: qty 2

## 2015-08-05 MED ORDER — HYDROMORPHONE HCL 1 MG/ML IJ SOLN
0.5000 mg | Freq: Once | INTRAMUSCULAR | Status: AC
  Administered 2015-08-05: 0.5 mg via INTRAVENOUS
  Filled 2015-08-05: qty 1

## 2015-08-05 NOTE — Discharge Instructions (Signed)
Follow-up with your doctor after he finished the antibiotic

## 2015-08-05 NOTE — ED Notes (Signed)
Pt alert & oriented x4, stable gait. Patient given discharge instructions, paperwork & prescription(s). Patient  instructed to stop at the registration desk to finish any additional paperwork. Patient verbalized understanding. Pt left department w/ no further questions. 

## 2015-08-05 NOTE — ED Provider Notes (Signed)
CSN: [id number]     Arrival date & time 08/05/15  1620 History   First MD Initiated Contact with Patient 08/05/15 1628     Chief Complaint  Patient presents with  . Sickle Cell Pain Crisis     (Consider location/radiation/quality/duration/timing/severity/associated sxs/prior Treatment) Patient is a 34 y.o. female presenting with sickle cell pain. The history is provided by the patient (Patient states she has sickle cell pain in her arms and leg.   The patient states she may have a UTI causing this).  Sickle Cell Pain Crisis Pain location: Arms and legs. Severity:  Moderate Onset quality:  Sudden Timing:  Constant Progression:  Unchanged Chronicity:  Recurrent Associated symptoms: no chest pain, no congestion, no cough, no fatigue and no headaches     Past Medical History  Diagnosis Date  . Sickle cell disease ([place])   . Sickle cell disease, type S ([place])   . Blood transfusion   . 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 HCAP (healthcare-associated pneumonia) 05/19/2013  . Sickle cell anemia ([facility])   . 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 Vitamin B12 deficiency 07/07/2015   Past Surgical History  Procedure Laterality Date  . Cholecystectomy    .  left knee acl reconstruction    . Cesarean section      x 2  . Portacath placement    . Portacath placement Left 02/23/2013    Procedure: INSERTION PORT-A-CATH;  Surgeon: Donato Heinz, MD;  Location: [facility];  Service: General;  Laterality: Left;  . Port-a-cath removal Left 07/29/2013    Procedure: REMOVAL PORT-A-CATH;  Surgeon: Scherry Ran, MD;  Location: [facility];  Service: General;  Laterality: Left;  . Tee without cardioversion N/A 08/07/2013    Procedure: TRANSESOPHAGEAL ECHOCARDIOGRAM (TEE);  Surgeon: Arnoldo Lenis, MD;  Location: [facility];  Service: Cardiology;  Laterality: N/A;   Family History  Problem Relation Age of Onset  . Sickle cell trait Mother   . Sickle cell trait Father   . Hypertension  Father   . Diabetes Father   . Sickle cell trait Sister   . Cancer Paternal Aunt     Breast   Social History  Substance Use Topics  . Smoking status: Never Smoker   . Smokeless tobacco: Former Systems developer  . Alcohol Use: No   OB History    Gravida Para Term Preterm AB TAB SAB Ectopic Multiple Living   1 1  1      1      Review of Systems  Constitutional: Negative for appetite change and fatigue.  HENT: Negative for congestion, ear discharge and sinus pressure.   Eyes: Negative for discharge.  Respiratory: Negative for cough.   Cardiovascular: Negative for chest pain.  Gastrointestinal: Negative for abdominal pain and diarrhea.  Genitourinary: Negative for frequency and hematuria.  Musculoskeletal: Negative for back pain.       Pains in both arms and legs  Skin: Negative for rash.  Neurological: Negative for seizures and headaches.  Psychiatric/Behavioral: Negative for hallucinations.      Allergies  Desferal; Latex; Lisinopril; and Tape  Home Medications   Prior to Admission medications   Medication Sig Start Date End Date Taking? Authorizing Provider  cetirizine (ZYRTEC) 10 MG tablet TAKE 1 TABLET (10 MG TOTAL) BY MOUTH DAILY. 05/06/15  Yes Historical Provider, MD  folic acid (FOLVITE) 1 MG tablet Take 1 tablet (1 mg total) by mouth daily. 03/31/14  Yes [name], FNP  hydroxyurea (HYDREA) 500 MG  capsule Take 2 capsules (1,000 mg total) by mouth daily. May take with food to minimize GI side effects. 02/14/15  Yes Leana Gamer, MD  Oxycodone HCl 10 MG TABS Take 1 tablet (10 mg total) by mouth every 4 (four) hours as needed. 06/24/15  Yes [name], MD  potassium chloride (K-DUR,KLOR-CON) 20 MEQ tablet Take 1 tablet (20 mEq total) by mouth daily. 07/07/15  Yes [name], MD  promethazine (PHENERGAN) 12.5 MG tablet Take 1 tablet (12.5 mg total) by mouth every 6 (six) hours as needed for nausea. 05/23/15  Yes Micheline Chapman, NP  Vitamin D, Ergocalciferol,  (DRISDOL) 50000 UNITS CAPS capsule Take 1 capsule (50,000 Units total) by mouth every 7 (seven) days. Takes on Sundays. 03/31/14  Yes [name], FNP  cephALEXin (KEFLEX) 500 MG capsule Take 1 capsule (500 mg total) by mouth 4 (four) times daily. 08/05/15   [name], MD  methocarbamol (ROBAXIN) 500 MG tablet Take 1 tablet (500 mg total) by mouth every 8 (eight) hours as needed for muscle spasms. Patient not taking: Reported on 08/05/2015 07/07/15   [name], MD   BP 127/74 mmHg  Pulse 93  Temp(Src) 98.6 F (37 C) (Oral)  Resp 16  Wt 160 lb (72.576 kg)  SpO2 96% Physical Exam  Constitutional: She is oriented to person, place, and time. She appears well-developed.  HENT:  Head: Normocephalic.  Eyes: Conjunctivae and EOM are normal. No scleral icterus.  Neck: Neck supple. No thyromegaly present.  Cardiovascular: Normal rate and regular rhythm.  Exam reveals no gallop and no friction rub.   No murmur heard. Pulmonary/Chest: No stridor. She has no wheezes. She has no rales. She exhibits no tenderness.  Abdominal: She exhibits no distension. There is no tenderness. There is no rebound.  Musculoskeletal: Normal range of motion. She exhibits no edema.  Lymphadenopathy:    She has no cervical adenopathy.  Neurological: She is oriented to person, place, and time. She exhibits normal muscle tone. Coordination normal.  Skin: No rash noted. No erythema.  Psychiatric: She has a normal mood and affect. Her behavior is normal.    ED Course  Procedures (including critical care time) Labs Review Labs Reviewed  CBC WITH DIFFERENTIAL/PLATELET - Abnormal; Notable for the following:    RBC 2.06 (*)    Hemoglobin 8.1 (*)    HCT 22.1 (*)    MCV 107.3 (*)    MCH 39.3 (*)    MCHC 36.7 (*)    RDW 16.1 (*)    All other components within normal limits  COMPREHENSIVE METABOLIC PANEL - Abnormal; Notable for the following:    Potassium 3.3 (*)    Glucose, Bld 121 (*)    BUN 5 (*)     Calcium 8.5 (*)    Albumin 3.1 (*)    AST 65 (*)    Total Bilirubin 6.9 (*)    All other components within normal limits  URINALYSIS, ROUTINE W REFLEX MICROSCOPIC (NOT AT [facility]) - Abnormal; Notable for the following:    APPearance HAZY (*)    Hgb urine dipstick LARGE (*)    Bilirubin Urine MODERATE (*)    Protein, ur 100 (*)    Urobilinogen, UA 2.0 (*)    Leukocytes, UA LARGE (*)    All other components within normal limits  RETICULOCYTES - Abnormal; Notable for the following:    Retic Ct Pct 19.2 (*)    RBC. 2.06 (*)    Retic Count, Manual 395.5 (*)  All other components within normal limits  URINE MICROSCOPIC-ADD ON - Abnormal; Notable for the following:    Bacteria, UA FEW (*)    All other components within normal limits  URINE CULTURE    Imaging Review Dg Chest 2 View  08/05/2015  CLINICAL DATA:  Central chest pain 2 days.  Sickle cell crisis. EXAM: CHEST  2 VIEW COMPARISON:  07/06/2015, 07/02/2015 and 06/27/2015 FINDINGS: Lungs are adequately inflated with resolution of the previous seen bibasilar opacification and effusions. Cardiomediastinal silhouette is within normal. Stable chronic changes of the left humeral head. IMPRESSION: No active cardiopulmonary disease. Electronically Signed   By: Marin Olp M.D.   On: 08/05/2015 17:54   I have personally reviewed and evaluated these images and lab results as part of my medical decision-making.   EKG Interpretation None      MDM   Final diagnoses:  UTI (lower urinary tract infection)    Labs show an elevated reticulocyte count. With anemia stable. Urinary tract infection. Patient improved with treatment in the emergency department. We'll send her home with Keflex and follow-up with PCP    [name], MD 08/05/15 2036

## 2015-08-05 NOTE — ED Notes (Signed)
Pt here for sickle cell pain crisis. Pt c/o pain to bilateral arms, legs, and chest.

## 2015-08-09 LAB — URINE CULTURE
Culture: 70000
Special Requests: NORMAL

## 2015-08-10 ENCOUNTER — Telehealth (HOSPITAL_BASED_OUTPATIENT_CLINIC_OR_DEPARTMENT_OTHER): Payer: Self-pay | Admitting: Emergency Medicine

## 2015-08-10 NOTE — Telephone Encounter (Signed)
Post ED Visit - Positive Culture Follow-up  Culture report reviewed by antimicrobial stewardship pharmacist:  []  Elenor Quinones, Pharm.D. []  Heide Guile, Pharm.D., BCPS [x]  Parks Neptune, Pharm.D. []  Alycia Rossetti, Pharm.D., BCPS []  [place], Pharm.D., BCPS, AAHIVP []  Legrand Como, Pharm.D., BCPS, AAHIVP []  Milus Glazier, Pharm.D. []  Stephens November, Pharm.D.  Positive urine culture E. coli Treated with cephalexin, organism sensitive to the same and no further patient follow-up is required at this time.  Hazle Nordmann 08/10/2015, 9:42 AM

## 2015-08-16 ENCOUNTER — Encounter: Payer: Self-pay | Admitting: Family Medicine

## 2015-08-16 ENCOUNTER — Encounter (HOSPITAL_COMMUNITY): Payer: Self-pay | Admitting: *Deleted

## 2015-08-16 ENCOUNTER — Non-Acute Institutional Stay (HOSPITAL_COMMUNITY)
Admission: AD | Admit: 2015-08-16 | Discharge: 2015-08-16 | Disposition: A | Discharge status: Home / Self Care | Payer: BLUE CROSS/BLUE SHIELD | Source: Ambulatory Visit | Attending: Internal Medicine | Admitting: Internal Medicine

## 2015-08-16 ENCOUNTER — Ambulatory Visit (INDEPENDENT_AMBULATORY_CARE_PROVIDER_SITE_OTHER): Payer: BLUE CROSS/BLUE SHIELD | Admitting: Family Medicine

## 2015-08-16 VITALS — BP 139/78 | HR 91 | Temp 98.3°F | Resp 16 | Ht 63.0 in | Wt 159.0 lb

## 2015-08-16 DIAGNOSIS — E559 Vitamin D deficiency, unspecified: Secondary | ICD-10-CM

## 2015-08-16 DIAGNOSIS — D5700 Hb-SS disease with crisis, unspecified: Secondary | ICD-10-CM | POA: Diagnosis not present

## 2015-08-16 DIAGNOSIS — D571 Sickle-cell disease without crisis: Secondary | ICD-10-CM

## 2015-08-16 DIAGNOSIS — Z79899 Other long term (current) drug therapy: Secondary | ICD-10-CM | POA: Insufficient documentation

## 2015-08-16 DIAGNOSIS — Z79891 Long term (current) use of opiate analgesic: Secondary | ICD-10-CM | POA: Diagnosis not present

## 2015-08-16 DIAGNOSIS — M545 Low back pain: Secondary | ICD-10-CM | POA: Diagnosis present

## 2015-08-16 DIAGNOSIS — E538 Deficiency of other specified B group vitamins: Secondary | ICD-10-CM

## 2015-08-16 LAB — POCT URINALYSIS DIP (DEVICE)
GLUCOSE, UA: NEGATIVE mg/dL
KETONES UR: NEGATIVE mg/dL
LEUKOCYTES UA: NEGATIVE
Nitrite: NEGATIVE
PH: 6.5 (ref 5.0–8.0)
Protein, ur: >=300 mg/dL — AB
SPECIFIC GRAVITY, URINE: 1.02 (ref 1.005–1.030)
Urobilinogen, UA: 4 mg/dL — ABNORMAL HIGH (ref 0.0–1.0)

## 2015-08-16 LAB — VITAMIN B12: VITAMIN B 12: 570 pg/mL (ref 211–911)

## 2015-08-16 MED ORDER — FOLIC ACID 1 MG PO TABS: 1.0000 mg | ORAL_TABLET | Freq: Every day | ORAL | Status: DC

## 2015-08-16 MED ORDER — HYDROMORPHONE HCL 2 MG/ML IJ SOLN
1.8000 mg | Freq: Once | INTRAMUSCULAR | Status: AC
  Administered 2015-08-16: 1.8 mg via INTRAVENOUS
  Filled 2015-08-16: qty 1

## 2015-08-16 MED ORDER — VITAMIN D (ERGOCALCIFEROL) 1.25 MG (50000 UNIT) PO CAPS: 50000.0000 [IU] | ORAL_CAPSULE | ORAL | Status: DC

## 2015-08-16 MED ORDER — HYDROMORPHONE HCL 2 MG/ML IJ SOLN
1.4000 mg | Freq: Once | INTRAMUSCULAR | Status: AC
  Administered 2015-08-16: 1.4 mg via SUBCUTANEOUS
  Filled 2015-08-16: qty 1

## 2015-08-16 MED ORDER — PROMETHAZINE HCL 25 MG PO TABS
12.5000 mg | ORAL_TABLET | Freq: Once | ORAL | Status: AC
  Administered 2015-08-16: 12.5 mg via ORAL
  Filled 2015-08-16: qty 1

## 2015-08-16 MED ORDER — DEXTROSE-NACL 5-0.45 % IV SOLN
INTRAVENOUS | Status: DC
  Administered 2015-08-16: 11:00:00 via INTRAVENOUS

## 2015-08-16 MED ORDER — HYDROXYUREA 500 MG PO CAPS: 1000.0000 mg | ORAL_CAPSULE | Freq: Every day | ORAL | Status: DC

## 2015-08-16 MED ORDER — HYDROMORPHONE HCL 2 MG/ML IJ SOLN: 1.4000 mg | Freq: Once | INTRAMUSCULAR | Status: DC

## 2015-08-16 MED ORDER — OXYCODONE HCL 5 MG PO TABS
10.0000 mg | ORAL_TABLET | Freq: Once | ORAL | Status: AC
  Administered 2015-08-16: 10 mg via ORAL
  Filled 2015-08-16: qty 2

## 2015-08-16 MED ORDER — OXYCODONE HCL 10 MG PO TABS: 10.0000 mg | ORAL_TABLET | ORAL | Status: DC | PRN

## 2015-08-16 MED ORDER — SODIUM CHLORIDE 0.9 % IV SOLN
25.0000 mg | Freq: Once | INTRAVENOUS | Status: AC
  Administered 2015-08-16: 25 mg via INTRAVENOUS
  Filled 2015-08-16: qty 0.5

## 2015-08-16 MED ORDER — HYDROMORPHONE HCL 2 MG/ML IJ SOLN
2.2000 mg | Freq: Once | INTRAMUSCULAR | Status: AC
  Administered 2015-08-16: 2.2 mg via INTRAVENOUS
  Filled 2015-08-16: qty 2

## 2015-08-16 MED ORDER — KETOROLAC TROMETHAMINE 30 MG/ML IJ SOLN
30.0000 mg | Freq: Once | INTRAMUSCULAR | Status: AC
  Administered 2015-08-16: 30 mg via INTRAVENOUS
  Filled 2015-08-16: qty 1

## 2015-08-16 NOTE — Discharge Summary (Signed)
[facility] Discharge Summary   Patient ID: Dawn [name] MRN: [id number] DOB/AGE: [birth year] 34 y.o.  Admit date: 08/16/2015 Discharge date: 08/16/2015  Primary Care Physician:  [name],[name], MD  Admission Diagnoses:  Active Problems:   Sickle cell crisis [facility])   Discharge Medications:    Medication List    ASK your doctor about these medications        cephALEXin 500 MG capsule  Commonly known as:  KEFLEX  Take 1 capsule (500 mg total) by mouth 4 (four) times daily.     cetirizine 10 MG tablet  Commonly known as:  ZYRTEC  TAKE 1 TABLET (10 MG TOTAL) BY MOUTH DAILY.     folic acid 1 MG tablet  Commonly known as:  FOLVITE  Take 1 tablet (1 mg total) by mouth daily.     hydroxyurea 500 MG capsule  Commonly known as:  HYDREA  Take 2 capsules (1,000 mg total) by mouth daily. May take with food to minimize GI side effects.     methocarbamol 500 MG tablet  Commonly known as:  ROBAXIN  Take 1 tablet (500 mg total) by mouth every 8 (eight) hours as needed for muscle spasms.     Oxycodone HCl 10 MG Tabs  Take 1 tablet (10 mg total) by mouth every 4 (four) hours as needed.     potassium chloride SA 20 MEQ tablet  Commonly known as:  K-DUR,KLOR-CON  Take 1 tablet (20 mEq total) by mouth daily.     promethazine 12.5 MG tablet  Commonly known as:  PHENERGAN  Take 1 tablet (12.5 mg total) by mouth every 6 (six) hours as needed for nausea.     Vitamin D (Ergocalciferol) 50000 UNITS Caps capsule  Commonly known as:  DRISDOL  Take 1 capsule (50,000 Units total) by mouth every 7 (seven) days. Takes on Sundays.         Consults:  None  Significant Diagnostic Studies:  Dg Chest 2 View  08/05/2015  CLINICAL DATA:  Central chest pain 2 days.  Sickle cell crisis. EXAM: CHEST  2 VIEW COMPARISON:  07/06/2015, 07/02/2015 and 06/27/2015 FINDINGS: Lungs are adequately inflated with resolution of the previous seen bibasilar opacification and effusions.  Cardiomediastinal silhouette is within normal. Stable chronic changes of the left humeral head. IMPRESSION: No active cardiopulmonary disease. Electronically Signed   By: Marin Olp M.D.   On: 08/05/2015 17:54     [facility] Course: Ms. Economou was transitioned to the day infusion center for extended observation.  Patient was started on IV fluids for cellular rehydration Ms. Epping was given dilaudid iv per weight based rapid redosing; she used a total of 4.4 mg Pain intensity decreased from 8/10 to 4/10 Patient is alert, oriented, and ambulatory Patient reminded to continue medication regimen and hydrate with 64 ounces of water per day.  Patient is to follow up in office as previously scheduled.   Physical Exam at Discharge:  BP 122/82 mmHg  Pulse 85  Temp(Src) 98.4 F (36.9 C) (Oral)  Resp 18  Ht 5\' 3"  (1.6 m)  Wt 159 lb (72.122 kg)  BMI 28.17 kg/m2  SpO2 97%  LMP   General Appearance:    Alert, cooperative, no distress, appears stated age  Head:    Normocephalic, without obvious abnormality, atraumatic  Eyes:    PERRL, conjunctiva/corneas clear, EOM's intact, fundi    Benign, icteric both eyes  Back:     Symmetric, no curvature, ROM  normal, no CVA tenderness  Lungs:     Clear to auscultation bilaterally, respirations unlabored  Chest Wall:    No tenderness or deformity   Heart:    Regular rate and rhythm, S1 and S2 normal, no murmur, rub   or gallop  Extremities:   Extremities normal, atraumatic, no cyanosis or edema  Pulses:   2+ and symmetric all extremities  Skin:   Skin color, texture, turgor normal, no rashes or lesions  Lymph nodes:   Cervical, supraclavicular, and axillary nodes normal  Neurologic:   CNII-XII intact, normal strength, sensation and reflexes    throughout  Disposition at Discharge: 01-Home or Self Care  Discharge Orders:   Condition at Discharge:   Stable  Time spent on Discharge:  Greater than 30  minutes.  Signed: Alie Moudy M 08/16/2015, 2:57 PM

## 2015-08-16 NOTE — H&P (Signed)
[facility] History and Physical   Date: 08/16/2015  Patient name: Dawn [name] Medical record number: [id number] Date of birth: 1981/02/22 Age: 34 y.o. Gender: female PCP: [name],[name], MD  Attending physician: Tresa Garter, MD  Chief Complaint: lower back and upper extremities  History of Present Illness: [name] is a 34 year old female with a history of sickle cell anemia, HbSS. She states that she is having increased pain to the upper extremities and lower back that are consistent with sickle cell anemia. Ms. Butto states that she has been out of medications for greater than 1 month. She attributes current sickle cell crisis to weather changes. Her current pain intensity is 8/10 to lower back and upper extremities described as constant and throbbing. She denies fever, chest pains, nausea, vomiting, and diarrhea.    Meds: Prescriptions prior to admission  Medication Sig Dispense Refill Last Dose  . cephALEXin (KEFLEX) 500 MG capsule Take 1 capsule (500 mg total) by mouth 4 (four) times daily. 28 capsule 0 Taking  . cetirizine (ZYRTEC) 10 MG tablet TAKE 1 TABLET (10 MG TOTAL) BY MOUTH DAILY.  3 Taking  . folic acid (FOLVITE) 1 MG tablet Take 1 tablet (1 mg total) by mouth daily. 30 tablet 11 Taking  . hydroxyurea (HYDREA) 500 MG capsule Take 2 capsules (1,000 mg total) by mouth daily. May take with food to minimize GI side effects. 60 capsule 11 Taking  . methocarbamol (ROBAXIN) 500 MG tablet Take 1 tablet (500 mg total) by mouth every 8 (eight) hours as needed for muscle spasms. (Patient not taking: Reported on 08/05/2015) 30 tablet 0 Not Taking  . Oxycodone HCl 10 MG TABS Take 1 tablet (10 mg total) by mouth every 4 (four) hours as needed. 60 tablet 0   . potassium chloride (K-DUR,KLOR-CON) 20 MEQ tablet Take 1 tablet (20 mEq total) by mouth daily. (Patient not taking: Reported on 08/16/2015) 7 tablet 0 Not Taking  . promethazine (PHENERGAN) 12.5 MG  tablet Take 1 tablet (12.5 mg total) by mouth every 6 (six) hours as needed for nausea. 12 tablet 0 Taking  . Vitamin D, Ergocalciferol, (DRISDOL) 50000 UNITS CAPS capsule Take 1 capsule (50,000 Units total) by mouth every 7 (seven) days. Takes on Sundays. 30 capsule 11 Taking    Allergies: Desferal; Latex; Lisinopril; and Tape Past Medical History  Diagnosis Date  . Sickle cell disease ([place])   . Sickle cell disease, type S ([place])   . Blood transfusion   . 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 HCAP (healthcare-associated pneumonia) 05/19/2013  . Sickle cell anemia ([facility])   . 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 Vitamin B12 deficiency 07/07/2015   Past Surgical History  Procedure Laterality Date  . Cholecystectomy    .  left knee acl reconstruction    . Cesarean section      x 2  . Portacath placement    . Portacath placement Left 02/23/2013    Procedure: INSERTION PORT-A-CATH;  Surgeon: Donato Heinz, MD;  Location: [facility];  Service: General;  Laterality: Left;  . Port-a-cath removal Left 07/29/2013    Procedure: REMOVAL PORT-A-CATH;  Surgeon: Scherry Ran, MD;  Location: [facility];  Service: General;  Laterality: Left;  . Tee without cardioversion N/A 08/07/2013    Procedure: TRANSESOPHAGEAL ECHOCARDIOGRAM (TEE);  Surgeon: Arnoldo Lenis, MD;  Location: [facility];  Service: Cardiology;  Laterality: N/A;   Family History  Problem Relation Age of Onset  .  Sickle cell trait Mother   . Sickle cell trait Father   . Hypertension Father   . Diabetes Father   . Sickle cell trait Sister   . Cancer Paternal Aunt     Breast   Social History   Social History  . Marital Status: Single    Spouse Name: N/A  . Number of Children: N/A  . Years of Education: N/A   Occupational History  . Not on file.   Social History Main Topics  . Smoking status: Never Smoker   . Smokeless tobacco: Former Systems developer  . Alcohol Use: No  . Drug Use: No  . Sexual Activity: Yes    Birth  Control/ Protection: IUD   Other Topics Concern  . Not on file   Social History Narrative    Review of Systems: Constitutional: positive for fatigue Eyes: positive for icterus Ears, nose, mouth, throat, and face: negative Respiratory: negative for dyspnea on exertion Cardiovascular: negative for chest pain, dyspnea, fatigue, near-syncope, orthopnea and tachypnea Gastrointestinal: negative for abdominal pain Genitourinary:negative Integument/breast: negative Hematologic/lymphatic: negative Musculoskeletal:positive for back pain and myalgias Neurological: negative Behavioral/Psych: negative Endocrine: negative Allergic/Immunologic: negative  Physical Exam: Blood pressure 122/82, pulse 85, temperature 98.4 F (36.9 C), temperature source Oral, resp. rate 18, height 5\' 3"  (1.6 m), weight 159 lb (72.122 kg), SpO2 97 %. BP 122/82 mmHg  Pulse 85  Temp(Src) 98.4 F (36.9 C) (Oral)  Resp 18  Ht 5\' 3"  (1.6 m)  Wt 159 lb (72.122 kg)  BMI 28.17 kg/m2  SpO2 97%  LMP   General Appearance:    Alert, cooperative, mild distress, appears stated age  Head:    Normocephalic, without obvious abnormality, atraumatic  Eyes:    PERRL, conjunctiva/corneas clear, EOM's intact, fundi    benign, both eyes  Ears:    Normal TM's and external ear canals, both ears  Nose:   Nares normal, septum midline, mucosa normal, no drainage    or sinus tenderness  Throat:   Lips, mucosa, and tongue normal; teeth and gums normal  Neck:   Supple, symmetrical, trachea midline, no adenopathy;    thyroid:  no enlargement/tenderness/nodules; no carotid   bruit or JVD  Back:     Symmetric, no curvature, ROM normal, no CVA tenderness  Lungs:     Clear to auscultation bilaterally, respirations unlabored  Chest Wall:    No tenderness or deformity   Heart:    Regular rate and rhythm, S1 and S2 normal, no murmur, rub   or gallop  Abdomen:     Soft, non-tender, bowel sounds active all four quadrants,    no masses, no  organomegaly  Extremities:   Extremities normal, atraumatic, no cyanosis or edema  Pulses:   2+ and symmetric all extremities  Skin:   Skin color, texture, turgor normal, no rashes or lesions  Lymph nodes:   Cervical, supraclavicular, and axillary nodes normal  Neurologic:   CNII-XII intact, normal strength, sensation and reflexes    throughout    Lab results: Results for orders placed or performed during the hospital encounter of 08/16/15 (from the past 24 hour(s))  POCT urinalysis dip (device)     Status: Abnormal   Collection Time: 08/16/15  9:53 AM  Result Value Ref Range   Glucose, UA NEGATIVE NEGATIVE mg/dL   Bilirubin Urine LARGE (A) NEGATIVE   Ketones, ur NEGATIVE NEGATIVE mg/dL   Specific Gravity, Urine 1.020 1.005 - 1.030   Hgb urine dipstick MODERATE (A) NEGATIVE  pH 6.5 5.0 - 8.0   Protein, ur >=300 (A) NEGATIVE mg/dL   Urobilinogen, UA 4.0 (H) 0.0 - 1.0 mg/dL   Nitrite NEGATIVE NEGATIVE   Leukocytes, UA NEGATIVE NEGATIVE    Imaging results:  No results found.   Assessment & Plan:   Patient will be admitted to the day infusion center for extended observation  Start IV D5.45 for cellular rehydration at 125/hr  Start Toradol 30 mg IV every 6 hours for inflammation.  Will give IV dilaudid per weight based rapid redosing protocol.   Patient will be re-evaluated for pain intensity in the context of function and relationship to baseline as care progresses.  If no significant pain relief, will transfer patient to inpatient services for a higher level of care.   Will check CMP,  LDH and CBC w/differential   Hollis,Lachina M 08/16/2015, 10:07 AM

## 2015-08-16 NOTE — Progress Notes (Signed)
Subjective:    Patient ID: Dawn [name], female    DOB: [birth year], 34 y.o.   MRN: [id number]  HPI [name] is a 34 year old female with a history of sickle cell anemia, HbSS. She states that she is having increased pain to the upper extremities and lower back that are consistent with sickle cell anemia. Ms. Akram states that she has been out of medications for greater than 1 month. She attributes current sickle cell crisis to weather changes. Her current pain intensity is 8/10 to lower back and upper extremities described as constant and throbbing. She denies fever, chest pains, nausea, vomiting, and diarrhea.  Past Medical History  Diagnosis Date  . Sickle cell disease ([place])   . Sickle cell disease, type S ([place])   . Blood transfusion   . 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 HCAP (healthcare-associated pneumonia) 05/19/2013  . Sickle cell anemia (H[facility])   . 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 Vitamin B12 deficiency 07/07/2015   Immunization History  Administered Date(s) Administered  . Influenza Split 07/07/2012  . Influenza,inj,Quad PF,36+ Mos 07/26/2013, 06/13/2014  . Pneumococcal Conjugate-13 12/16/2014  . Pneumococcal Polysaccharide-23 06/26/2010, 07/07/2012  . Tdap 08/24/2013   Social History   Social History  . Marital Status: Single    Spouse Name: N/A  . Number of Children: N/A  . Years of Education: N/A   Occupational History  . Not on file.   Social History Main Topics  . Smoking status: Never Smoker   . Smokeless tobacco: Former Systems developer  . Alcohol Use: No  . Drug Use: No  . Sexual Activity: Yes    Birth Control/ Protection: IUD   Other Topics Concern  . Not on file   Social History Narrative    Review of Systems     Objective:   Physical Exam    BP 139/78 mmHg  Pulse 91  Temp(Src) 98.3 F (36.8 C) (Oral)  Resp 16  Ht 5\' 3"  (1.6 m)  Wt 159 lb (72.122 kg)  BMI 28.17 kg/m2  Assessment & Plan:  1. Sickle cell anemia with crisis  [facility])  Patient will be admitted to the day infusion center for extended observation  Start IV D5.45 for cellular rehydration at 125/hr  Start Toradol 30 mg IV every 6 hours for inflammation.  Will give IV dilaudid per weight based rapid redosing protocol.   Patient will be re-evaluated for pain intensity in the context of function and relationship to baseline as care progresses.  If no significant pain relief, will transfer patient to inpatient services for a higher level of care.   Will check CMP,  LDH and CBC w/differential - POCT urinalysis dipstick - Oxycodone HCl 10 MG TABS; Take 1 tablet (10 mg total) by mouth every 4 (four) hours as needed.  Dispense: 60 tablet; Refill: 0  2. Vitamin D deficiency - Vitamin D, 25-hydroxy - Vitamin D, Ergocalciferol, (DRISDOL) 50000 UNITS CAPS capsule; Take 1 capsule (50,000 Units total) by mouth every 7 (seven) days. Takes on Sundays.  Dispense: 4 capsule; Refill: 11  3. B12 deficiency  - Vitamin B12  4. Hb-SS disease without crisis ([place]) - hydroxyurea (HYDREA) 500 MG capsule; Take 2 capsules (1,000 mg total) by mouth daily. May take with food to minimize GI side effects.  Dispense: 60 capsule; Refill: 11 - folic acid (FOLVITE) 1 MG tablet; Take 1 tablet (1 mg total) by mouth daily.  Dispense: 30 tablet; Refill: 11 Will follow up in office  in 1 month.

## 2015-08-16 NOTE — Progress Notes (Signed)
Patient ID: Dawn [name], female   DOB: 04-Jul-1981, 34 y.o.   MRN: [id number]  Discharge instructions given to patient.  Pain improved to 2/10 on pain scale.  Patient ambulatory at discharge.

## 2015-08-17 ENCOUNTER — Telehealth: Payer: Self-pay

## 2015-08-17 LAB — CBC WITH DIFFERENTIAL/PLATELET
BASOS PCT: 0 % (ref 0–1)
Basophils Absolute: 0 10*3/uL (ref 0.0–0.1)
EOS ABS: 0.1 10*3/uL (ref 0.0–0.7)
Eosinophils Relative: 1 % (ref 0–5)
HCT: 20.1 % — ABNORMAL LOW (ref 36.0–46.0)
HEMOGLOBIN: 7.2 g/dL — AB (ref 12.0–15.0)
Lymphocytes Relative: 38 % (ref 12–46)
Lymphs Abs: 3.2 10*3/uL (ref 0.7–4.0)
MCH: 38.5 pg — AB (ref 26.0–34.0)
MCHC: 35.8 g/dL (ref 30.0–36.0)
MCV: 107.5 fL — ABNORMAL HIGH (ref 78.0–100.0)
MONO ABS: 1.3 10*3/uL — AB (ref 0.1–1.0)
MONOS PCT: 16 % — AB (ref 3–12)
MPV: 10.7 fL (ref 8.6–12.4)
NEUTROS ABS: 3.8 10*3/uL (ref 1.7–7.7)
NEUTROS PCT: 45 % (ref 43–77)
PLATELETS: 316 10*3/uL (ref 150–400)
RBC: 1.87 MIL/uL — AB (ref 3.87–5.11)
RDW: 16.8 % — ABNORMAL HIGH (ref 11.5–15.5)
WBC: 8.4 10*3/uL (ref 4.0–10.5)

## 2015-08-17 LAB — VITAMIN D 25 HYDROXY (VIT D DEFICIENCY, FRACTURES): VIT D 25 HYDROXY: 23 ng/mL — AB (ref 30–100)

## 2015-08-17 LAB — RETICULOCYTES
ABS Retic: 325.4 10*3/uL — ABNORMAL HIGH (ref 19.0–186.0)
RBC.: 1.87 MIL/uL — AB (ref 3.87–5.11)
RETIC CT PCT: 17.4 % — AB (ref 0.4–2.3)

## 2015-08-17 NOTE — Telephone Encounter (Signed)
-----   Message from [name], [place] sent at 08/17/2015  7:38 AM EST ----- Please inquire about patient's current condition. Also, inform her that vitamin D is decreased, continue weekly drisdol 50,000 IU as prescribed. Vitamin B12 has normalized, she will not need to supplement with injections. Add an OTC B12 1000 mcg. Hemoglobin as not returned to baseline, labs indicate that she was in crisis. Increase water intake to 2-3 eight ounce bottles hourly while awake for the next several days and increase rest. Also, high dose Ibuprofen will be beneficial due to the inflammatory process of a crisis. Follow up with [place] as needed.   [place] ----- Message -----    From: Lab in [facility] Interface    Sent: 08/17/2015   1:21 AM      To: [name], FNP

## 2015-08-17 NOTE — Telephone Encounter (Signed)
CALLED AND LEFT MESSAGE FOR PATIENT TO RETURN CALL REGARDING LABS. THANKS!

## 2015-08-22 NOTE — Telephone Encounter (Signed)
Called and spoke with patient. She says she is feeling slightly better. Will continue to take vitamin D 50,000 units weekly and will start B12 otc supplement of 1042mcg daily. Encouraged patient to drink 2 to 3 eight ounce bottles weekly and increase rest to help with crisis. Advised patient that Ibuprofen will help also. Patient verbalized understanding and had no further questions at this time. Thanks!

## 2015-08-25 ENCOUNTER — Inpatient Hospital Stay (HOSPITAL_COMMUNITY)
Admission: EM | Admit: 2015-08-25 | Discharge: 2015-08-31 | DRG: 812 | Disposition: A | Discharge status: Home / Self Care | Payer: BLUE CROSS/BLUE SHIELD | Attending: Internal Medicine | Admitting: Internal Medicine

## 2015-08-25 ENCOUNTER — Emergency Department (HOSPITAL_COMMUNITY): Payer: BLUE CROSS/BLUE SHIELD

## 2015-08-25 ENCOUNTER — Encounter (HOSPITAL_COMMUNITY): Payer: Self-pay | Admitting: Cardiology

## 2015-08-25 ENCOUNTER — Telehealth (HOSPITAL_COMMUNITY): Payer: Self-pay | Admitting: *Deleted

## 2015-08-25 DIAGNOSIS — N3000 Acute cystitis without hematuria: Secondary | ICD-10-CM | POA: Diagnosis not present

## 2015-08-25 DIAGNOSIS — B9620 Unspecified Escherichia coli [E. coli] as the cause of diseases classified elsewhere: Secondary | ICD-10-CM | POA: Diagnosis present

## 2015-08-25 DIAGNOSIS — Z8701 Personal history of pneumonia (recurrent): Secondary | ICD-10-CM | POA: Diagnosis not present

## 2015-08-25 DIAGNOSIS — D571 Sickle-cell disease without crisis: Secondary | ICD-10-CM | POA: Diagnosis not present

## 2015-08-25 DIAGNOSIS — R509 Fever, unspecified: Secondary | ICD-10-CM

## 2015-08-25 DIAGNOSIS — N390 Urinary tract infection, site not specified: Secondary | ICD-10-CM | POA: Diagnosis present

## 2015-08-25 DIAGNOSIS — D5700 Hb-SS disease with crisis, unspecified: Secondary | ICD-10-CM | POA: Diagnosis present

## 2015-08-25 DIAGNOSIS — Z809 Family history of malignant neoplasm, unspecified: Secondary | ICD-10-CM

## 2015-08-25 DIAGNOSIS — R0902 Hypoxemia: Secondary | ICD-10-CM | POA: Diagnosis present

## 2015-08-25 DIAGNOSIS — Z833 Family history of diabetes mellitus: Secondary | ICD-10-CM

## 2015-08-25 DIAGNOSIS — Z8249 Family history of ischemic heart disease and other diseases of the circulatory system: Secondary | ICD-10-CM

## 2015-08-25 DIAGNOSIS — E559 Vitamin D deficiency, unspecified: Secondary | ICD-10-CM | POA: Diagnosis present

## 2015-08-25 DIAGNOSIS — R040 Epistaxis: Secondary | ICD-10-CM | POA: Diagnosis present

## 2015-08-25 LAB — CBC WITH DIFFERENTIAL/PLATELET
Basophils Absolute: 0 10*3/uL (ref 0.0–0.1)
Basophils Relative: 0 %
EOS ABS: 0.1 10*3/uL (ref 0.0–0.7)
EOS PCT: 1 %
HEMATOCRIT: 21.3 % — AB (ref 36.0–46.0)
Hemoglobin: 7.7 g/dL — ABNORMAL LOW (ref 12.0–15.0)
LYMPHS ABS: 2.6 10*3/uL (ref 0.7–4.0)
LYMPHS PCT: 28 %
MCH: 37.2 pg — AB (ref 26.0–34.0)
MCHC: 36.2 g/dL — ABNORMAL HIGH (ref 30.0–36.0)
MCV: 102.9 fL — AB (ref 78.0–100.0)
MONOS PCT: 19 %
Monocytes Absolute: 1.8 10*3/uL — ABNORMAL HIGH (ref 0.1–1.0)
Neutro Abs: 4.7 10*3/uL (ref 1.7–7.7)
Neutrophils Relative %: 52 %
Platelets: 187 10*3/uL (ref 150–400)
RBC: 2.07 MIL/uL — AB (ref 3.87–5.11)
RDW: 18.9 % — ABNORMAL HIGH (ref 11.5–15.5)
WBC: 9.1 10*3/uL (ref 4.0–10.5)

## 2015-08-25 LAB — COMPREHENSIVE METABOLIC PANEL
ALT: 34 U/L (ref 14–54)
AST: 91 U/L — AB (ref 15–41)
Albumin: 3.2 g/dL — ABNORMAL LOW (ref 3.5–5.0)
Alkaline Phosphatase: 143 U/L — ABNORMAL HIGH (ref 38–126)
Anion gap: 9 (ref 5–15)
BILIRUBIN TOTAL: 10.1 mg/dL — AB (ref 0.3–1.2)
BUN: 7 mg/dL (ref 6–20)
CALCIUM: 8.8 mg/dL — AB (ref 8.9–10.3)
CO2: 24 mmol/L (ref 22–32)
CREATININE: 0.42 mg/dL — AB (ref 0.44–1.00)
Chloride: 104 mmol/L (ref 101–111)
Glucose, Bld: 87 mg/dL (ref 65–99)
Potassium: 4.1 mmol/L (ref 3.5–5.1)
Sodium: 137 mmol/L (ref 135–145)
TOTAL PROTEIN: 8 g/dL (ref 6.5–8.1)

## 2015-08-25 LAB — URINALYSIS, ROUTINE W REFLEX MICROSCOPIC
GLUCOSE, UA: NEGATIVE mg/dL
Nitrite: POSITIVE — AB
PH: 5.5 (ref 5.0–8.0)
Protein, ur: >300 mg/dL — AB
SPECIFIC GRAVITY, URINE: 1.025 (ref 1.005–1.030)

## 2015-08-25 LAB — D-DIMER, QUANTITATIVE: D-Dimer, Quant: 3.35 ug/mL-FEU — ABNORMAL HIGH (ref 0.00–0.50)

## 2015-08-25 LAB — URINE MICROSCOPIC-ADD ON

## 2015-08-25 LAB — RETICULOCYTES: RBC.: 2.07 MIL/uL — AB (ref 3.87–5.11)

## 2015-08-25 LAB — LACTATE DEHYDROGENASE: LDH: 449 U/L — ABNORMAL HIGH (ref 98–192)

## 2015-08-25 LAB — TROPONIN I

## 2015-08-25 LAB — PREGNANCY, URINE: Preg Test, Ur: NEGATIVE

## 2015-08-25 MED ORDER — HYDROMORPHONE HCL 1 MG/ML IJ SOLN
1.0000 mg | Freq: Once | INTRAMUSCULAR | Status: AC
  Administered 2015-08-25: 1 mg via INTRAVENOUS
  Filled 2015-08-25: qty 1

## 2015-08-25 MED ORDER — SODIUM CHLORIDE 0.9 % IJ SOLN: 9.0000 mL | INTRAMUSCULAR | Status: DC | PRN

## 2015-08-25 MED ORDER — FOLIC ACID 1 MG PO TABS: 1.0000 mg | ORAL_TABLET | Freq: Every day | ORAL | Status: DC

## 2015-08-25 MED ORDER — POLYETHYLENE GLYCOL 3350 17 G PO PACK: 17.0000 g | PACK | Freq: Every day | ORAL | Status: DC | PRN

## 2015-08-25 MED ORDER — PROMETHAZINE HCL 25 MG RE SUPP: 12.5000 mg | RECTAL | Status: DC | PRN

## 2015-08-25 MED ORDER — FOLIC ACID 1 MG PO TABS
1.0000 mg | ORAL_TABLET | Freq: Every day | ORAL | Status: DC
  Administered 2015-08-26 – 2015-08-31 (×6): 1 mg via ORAL
  Filled 2015-08-25 (×6): qty 1

## 2015-08-25 MED ORDER — HYDROMORPHONE HCL 1 MG/ML IJ SOLN
1.0000 mg | Freq: Once | INTRAMUSCULAR | Status: AC
Start: 2015-08-25 — End: 2015-08-25
  Administered 2015-08-25: 1 mg via INTRAVENOUS
  Filled 2015-08-25: qty 1

## 2015-08-25 MED ORDER — ENOXAPARIN SODIUM 40 MG/0.4ML ~~LOC~~ SOLN
40.0000 mg | SUBCUTANEOUS | Status: DC
  Administered 2015-08-25 – 2015-08-26 (×2): 40 mg via SUBCUTANEOUS
  Filled 2015-08-25 (×2): qty 0.4

## 2015-08-25 MED ORDER — SODIUM CHLORIDE 0.9 % IV SOLN
25.0000 mg | INTRAVENOUS | Status: DC | PRN
  Filled 2015-08-25: qty 0.5

## 2015-08-25 MED ORDER — KETOROLAC TROMETHAMINE 30 MG/ML IJ SOLN
30.0000 mg | Freq: Once | INTRAMUSCULAR | Status: AC
  Administered 2015-08-25: 30 mg via INTRAVENOUS
  Filled 2015-08-25: qty 1

## 2015-08-25 MED ORDER — SODIUM CHLORIDE 0.9 % IV SOLN: INTRAVENOUS | Status: DC

## 2015-08-25 MED ORDER — SENNOSIDES-DOCUSATE SODIUM 8.6-50 MG PO TABS
1.0000 | ORAL_TABLET | Freq: Two times a day (BID) | ORAL | Status: DC
  Administered 2015-08-26 – 2015-08-31 (×10): 1 via ORAL
  Filled 2015-08-25 (×11): qty 1

## 2015-08-25 MED ORDER — CEFADROXIL 500 MG PO CAPS
500.0000 mg | ORAL_CAPSULE | Freq: Two times a day (BID) | ORAL | Status: DC
Start: 2015-08-25 — End: 2015-08-27
  Administered 2015-08-26 – 2015-08-27 (×3): 500 mg via ORAL
  Filled 2015-08-25 (×6): qty 1

## 2015-08-25 MED ORDER — ZOLPIDEM TARTRATE 5 MG PO TABS: 5.0000 mg | ORAL_TABLET | Freq: Every evening | ORAL | Status: DC | PRN

## 2015-08-25 MED ORDER — ALUM & MAG HYDROXIDE-SIMETH 200-200-20 MG/5ML PO SUSP: 15.0000 mL | ORAL | Status: DC | PRN

## 2015-08-25 MED ORDER — ACETAMINOPHEN 650 MG RE SUPP: 650.0000 mg | RECTAL | Status: DC | PRN

## 2015-08-25 MED ORDER — ONDANSETRON HCL 4 MG/2ML IJ SOLN
4.0000 mg | Freq: Once | INTRAMUSCULAR | Status: AC
  Administered 2015-08-25: 4 mg via INTRAVENOUS
  Filled 2015-08-25: qty 2

## 2015-08-25 MED ORDER — HYDROXYUREA 500 MG PO CAPS
1000.0000 mg | ORAL_CAPSULE | Freq: Every day | ORAL | Status: DC
  Administered 2015-08-26 – 2015-08-29 (×4): 1000 mg via ORAL
  Filled 2015-08-25 (×4): qty 2

## 2015-08-25 MED ORDER — NALOXONE HCL 0.4 MG/ML IJ SOLN: 0.4000 mg | INTRAMUSCULAR | Status: DC | PRN

## 2015-08-25 MED ORDER — KETOROLAC TROMETHAMINE 30 MG/ML IJ SOLN
30.0000 mg | Freq: Four times a day (QID) | INTRAMUSCULAR | Status: AC
  Administered 2015-08-25 – 2015-08-30 (×19): 30 mg via INTRAVENOUS
  Filled 2015-08-25 (×19): qty 1

## 2015-08-25 MED ORDER — DIPHENHYDRAMINE HCL 25 MG PO CAPS
25.0000 mg | ORAL_CAPSULE | ORAL | Status: DC | PRN
  Administered 2015-08-25: 50 mg via ORAL
  Administered 2015-08-28 – 2015-08-29 (×3): 25 mg via ORAL
  Administered 2015-08-29: 50 mg via ORAL
  Administered 2015-08-29: 25 mg via ORAL
  Administered 2015-08-29 – 2015-08-31 (×8): 50 mg via ORAL
  Filled 2015-08-25 (×2): qty 1
  Filled 2015-08-25 (×6): qty 2
  Filled 2015-08-25 (×2): qty 1
  Filled 2015-08-25 (×4): qty 2

## 2015-08-25 MED ORDER — PROMETHAZINE HCL 12.5 MG PO TABS
12.5000 mg | ORAL_TABLET | ORAL | Status: DC | PRN
  Administered 2015-08-25: 25 mg via ORAL
  Filled 2015-08-25: qty 2

## 2015-08-25 MED ORDER — ACETAMINOPHEN 325 MG PO TABS
650.0000 mg | ORAL_TABLET | ORAL | Status: DC | PRN
  Administered 2015-08-27 – 2015-08-30 (×3): 650 mg via ORAL
  Filled 2015-08-25 (×3): qty 2

## 2015-08-25 MED ORDER — HYDROMORPHONE HCL 1 MG/ML IJ SOLN
2.0000 mg | INTRAMUSCULAR | Status: DC | PRN
  Administered 2015-08-26 – 2015-08-28 (×7): 2 mg via INTRAVENOUS
  Filled 2015-08-25 (×8): qty 2

## 2015-08-25 MED ORDER — HYDROMORPHONE 1 MG/ML IV SOLN
INTRAVENOUS | Status: DC
  Administered 2015-08-25: 3.2 mg via INTRAVENOUS
  Administered 2015-08-25: 18:00:00 via INTRAVENOUS
  Administered 2015-08-26: 1.8 mg via INTRAVENOUS
  Administered 2015-08-26: 1.2 mg via INTRAVENOUS
  Administered 2015-08-26: 22:00:00 via INTRAVENOUS
  Administered 2015-08-26: 3 mg via INTRAVENOUS
  Administered 2015-08-26: 2.4 mg via INTRAVENOUS
  Administered 2015-08-27: 3.3 mg via INTRAVENOUS
  Filled 2015-08-25 (×2): qty 25

## 2015-08-25 MED ORDER — SODIUM CHLORIDE 0.9 % IV BOLUS (SEPSIS)
1000.0000 mL | Freq: Once | INTRAVENOUS | Status: AC
  Administered 2015-08-25: 1000 mL via INTRAVENOUS

## 2015-08-25 MED ORDER — IOHEXOL 350 MG/ML SOLN
100.0000 mL | Freq: Once | INTRAVENOUS | Status: AC | PRN
  Administered 2015-08-25: 100 mL via INTRAVENOUS

## 2015-08-25 MED ORDER — SODIUM CHLORIDE 0.45 % IV SOLN
INTRAVENOUS | Status: DC
  Administered 2015-08-25 – 2015-08-28 (×5): via INTRAVENOUS

## 2015-08-25 NOTE — ED Notes (Signed)
Chest pain since last night.  States it feels like her sickle cell pain. Pulse ox 88% on room air at work.

## 2015-08-25 NOTE — H&P (Addendum)
History and Physical  Dawn [name] [id number] DOB: [birth year] DOA: 08/25/2015  Referring physician: Dr Wyvonnia Dusky, ED physician PCP: [name],[name], MD   Chief Complaint: Chest pain  HPI: Dawn [name] is a 34 y.o. female  With sickle cell disease and sickle cell anemia who presents to the hospital after having increased left substernal chest pain has been constant since last night. There is no radiation to her pain. She denies shortness of breath, nausea, vomiting. She has tried her home medications (oxycodone) without relief. She describes the pain as similar to her normal episodes of sickle cell crisis.  She has had acute chest and pneumonia in the past and describes that her pain is not as severe as that episode. 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  She called her doctor and was instructed to come to the emergency department. She denies any provoking factors.   Review of Systems:   Admits to Frequency  Pt denies any fevers, chills, nausea, vomiting, diarrhea, constipation, abdominal pain, shortness of breath, dyspnea on exertion, orthopnea, cough, wheezing, palpitations, headache, vision changes, lightheadedness, dizziness, diarrhea, constipation, melena, rectal bleeding.  Review of systems are otherwise negative  Past Medical History  Diagnosis Date  . Sickle cell disease ([place])   . 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 Blood transfusion   . 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 HCAP (healthcare-associated pneumonia) 05/19/2013  . Sickle cell anemia ([facility])   . 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 Vitamin B12 deficiency 07/07/2015   Past Surgical History  Procedure Laterality Date  . Cholecystectomy    .  left knee acl reconstruction    . Cesarean section      x 2  . Portacath placement    . Portacath placement Left 02/23/2013    Procedure: INSERTION PORT-A-CATH;   Surgeon: Donato Heinz, MD;  Location: [facility];  Service: General;  Laterality: Left;  . Port-a-cath removal Left 07/29/2013    Procedure: REMOVAL PORT-A-CATH;  Surgeon: Scherry Ran, MD;  Location: [facility];  Service: General;  Laterality: Left;  . Tee without cardioversion N/A 08/07/2013    Procedure: TRANSESOPHAGEAL ECHOCARDIOGRAM (TEE);  Surgeon: Arnoldo Lenis, MD;  Location: [facility];  Service: Cardiology;  Laterality: N/A;   Social History:  reports that she has never smoked. She has quit using smokeless tobacco. She reports that she does not drink alcohol or use illicit drugs. Patient lives at home & is able to participate in activities of daily living  Allergies  Allergen Reactions  . Desferal [Deferoxamine] Hives    Local reaction on arm only during infusion. Can take with benadryl   . Latex Other (See Comments)    REACTION: Pt experiences a burning sensation on contacted skin areas  . Lisinopril Other (See Comments) and Cough    REACTION: Sore/scratchy throat  . Tape Other (See Comments)    REACTION: Pt. Experiences a burning sensation on contacted skin areas    Family History  Problem Relation Age of Onset  . Sickle cell trait Mother   . Sickle cell trait Father   . Hypertension Father   . Diabetes Father   . Sickle cell trait Sister   . Cancer Paternal Aunt     Breast     Prior to Admission medications   Medication Sig Start Date End Date Taking? Authorizing Provider  cetirizine (ZYRTEC) 10 MG  tablet TAKE 1 TABLET (10 MG TOTAL) BY MOUTH DAILY. 05/06/15  Yes Historical Provider, MD  folic acid (FOLVITE) 1 MG tablet Take 1 tablet (1 mg total) by mouth daily. 08/16/15  Yes [name], FNP  hydroxyurea (HYDREA) 500 MG capsule Take 2 capsules (1,000 mg total) by mouth daily. May take with food to minimize GI side effects. 08/16/15  Yes [name], FNP  ibuprofen (ADVIL,MOTRIN) 200 MG tablet Take 400 mg by mouth every 6 (six) hours as needed.   Yes  Historical Provider, MD  Oxycodone HCl 10 MG TABS Take 1 tablet (10 mg total) by mouth every 4 (four) hours as needed. 08/16/15  Yes [name], FNP  promethazine (PHENERGAN) 12.5 MG tablet Take 1 tablet (12.5 mg total) by mouth every 6 (six) hours as needed for nausea. 05/23/15  Yes Micheline Chapman, NP  Vitamin D, Ergocalciferol, (DRISDOL) 50000 UNITS CAPS capsule Take 1 capsule (50,000 Units total) by mouth every 7 (seven) days. Takes on Sundays. 08/16/15  Yes [name], FNP    Physical Exam: BP 120/73 mmHg  Pulse 88  Temp(Src) 97.9 F (36.6 C) (Oral)  Resp 14  Ht 5\' 3"  (1.6 m)  Wt 72.576 kg (160 lb)  BMI 28.35 kg/m2  SpO2 93%  General: Young black female. Awake and alert and oriented x3. No acute cardiopulmonary distress.  Eyes: Pupils equal, round, reactive to light. Extraocular muscles are intact. Sclerae anicteric and noninjected.  ENT:  Moist mucosal membranes. No mucosal lesions.  Neck: Neck supple without lymphadenopathy. No carotid bruits. No masses palpated.  Cardiovascular: Regular rate with normal S1-S2 sounds. No murmurs, rubs, gallops auscultated. No JVD.  Respiratory: Good respiratory effort with no wheezes, rales, rhonchi. Lungs clear to auscultation bilaterally.  Abdomen: Soft, nontender, nondistended. Active bowel sounds. No masses or hepatosplenomegaly  Skin: Dry, warm to touch. 2+ dorsalis pedis and radial pulses. Musculoskeletal: No calf or leg pain. All major joints not erythematous nontender.  Psychiatric: Intact judgment and insight.  Neurologic: No focal neurological deficits. Cranial nerves II through XII are grossly intact.           Labs on Admission:  Basic Metabolic Panel:  Recent Labs Lab 08/25/15 1204  NA 137  K 4.1  CL 104  CO2 24  GLUCOSE 87  BUN 7  CREATININE 0.42*  CALCIUM 8.8*   Liver Function Tests:  Recent Labs Lab 08/25/15 1204  AST 91*  ALT 34  ALKPHOS 143*  BILITOT 10.1*  PROT 8.0  ALBUMIN 3.2*   No  results for input(s): LIPASE, AMYLASE in the last 168 hours. No results for input(s): AMMONIA in the last 168 hours. CBC:  Recent Labs Lab 08/25/15 1204  WBC 9.1  NEUTROABS 4.7  HGB 7.7*  HCT 21.3*  MCV 102.9*  PLT 187   Cardiac Enzymes:  Recent Labs Lab 08/25/15 1204  TROPONINI <0.03    BNP (last 3 results) No results for input(s): BNP in the last 8760 hours.  ProBNP (last 3 results) No results for input(s): PROBNP in the last 8760 hours.  CBG: No results for input(s): GLUCAP in the last 168 hours.  Radiological Exams on Admission: Dg Chest 2 View  08/25/2015  CLINICAL DATA:  Chest pain and short of breath EXAM: CHEST  2 VIEW COMPARISON:  08/05/2015 FINDINGS: Normal heart size. Clear lungs. No pneumothorax. No pleural effusion. IMPRESSION: No active cardiopulmonary disease. Electronically Signed   By: Marybelle Killings M.D.   On: 08/25/2015 12:40  Ct Angio Chest Pe W/cm &/or Wo Cm  08/25/2015  CLINICAL DATA:  Chest pain since last night. States it feels like her sickle cell pain. Pulse ox 88% on room air at work. EXAM: CT ANGIOGRAPHY CHEST WITH CONTRAST TECHNIQUE: Multidetector CT imaging of the chest was performed using the standard protocol during bolus administration of intravenous contrast. Multiplanar CT image reconstructions and MIPs were obtained to evaluate the vascular anatomy. CONTRAST:  141mL OMNIPAQUE IOHEXOL 350 MG/ML SOLN COMPARISON:  Current chest radiograph FINDINGS: Angiographic study: No evidence of a pulmonary embolus. Great vessels are normal in caliber. No atherosclerotic plaque. Neck base and axilla:  No mass or adenopathy.  Normal thyroid. Mediastinum and hila: Heart normal in size and configuration. No masses or adenopathy. Lungs and pleura: Minimal dependent subsegmental atelectasis in the lung bases and lateral right upper lobe. The lungs are otherwise clear. No pleural effusion or pneumothorax. Limited upper abdomen: Morphologic changes of the liver are  consistent with cirrhosis with surface nodularity, central volume loss and relative enlargement of the caudate lobe and lateral segment of the left lobe. No liver mass or focal lesion. Gallbladder surgically absent. Musculoskeletal: Irregular sclerosis is noted mostly along the mid to lower thoracic spine. Findings consistent with patient's reported history of sickle cell disease. No osteolytic lesions. Review of the MIP images confirms the above findings. IMPRESSION: 1. No evidence of a pulmonary embolus. 2. No acute findings. Electronically Signed   By: Lajean Manes M.D.   On: 08/25/2015 14:04    EKG: Independently reviewed. Normal sinus rhythm. No ST elevation or depression.  Assessment/Plan Present on Admission:  . Sickle cell crisis ([place]) . UTI (urinary tract infection)  This patient was discussed with the ED physician, including pertinent vitals, physical exam findings, labs, and imaging.  We also discussed care given by the ED provider.  #1 sickle cell crisis  CTA and chest x-ray - No evidence of acute chest  Admit  Dilaudid PCA: Full dose  IV fluids  Hydroxyurea  Toradol scheduled  CBC with crit to count tomorrow  Recheck metabolic panel with LFTs as her bilirubin is elevated to 10  Add-on LDH #2 UTI  Urine culture  Duracef  DVT prophylaxis: Lovenox  Consultants: None  Code Status: Full code  Family Communication: None   Disposition Plan: Admission   Truett Mainland, DO [facility] Pager [phone]

## 2015-08-25 NOTE — ED Notes (Signed)
MD at bedside. 

## 2015-08-25 NOTE — ED Notes (Signed)
Pt taken to CT.

## 2015-08-25 NOTE — ED Provider Notes (Signed)
CSN: [id number]     Arrival date & time 08/25/15  1116 History   First MD Initiated Contact with Patient 08/25/15 1200     Chief Complaint  Patient presents with  . Sickle Cell Pain Crisis     (Consider location/radiation/quality/duration/timing/severity/associated sxs/prior Treatment) HPI Comments: Patient reports typical sickle cell pain to her left chest that has been constant since last night. It does not radiate to her arms, neck or back. There is no shortness of breath, nausea, vomiting, diaphoresis or syncope. She's been taking oxycodone without relief. This is similar to previous episodes of sickle cell pain. She denies any fever or cough. No runny nose or sore throat. No abdominal pain, nausea or vomiting. She reports she has had acute chest syndrome and pneumonia in the past. 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 Denies any leg pain or leg swelling. She does take a Mirena for birth control. No history of blood clots.  Patient is a 34 y.o. female presenting with sickle cell pain. The history is provided by the patient.  Sickle Cell Pain Crisis Associated symptoms: chest pain   Associated symptoms: no congestion, no cough, no fever, no headaches, no nausea, no shortness of breath and no vomiting     Past Medical History  Diagnosis Date  . Sickle cell disease ([place])   . 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 Blood transfusion   . 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 HCAP (healthcare-associated pneumonia) 05/19/2013  . Sickle cell anemia ([facility])   . 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 Vitamin B12 deficiency 07/07/2015   Past Surgical History  Procedure Laterality Date  . Cholecystectomy    .  left knee acl reconstruction    . Cesarean section      x 2  . Portacath placement    . Portacath placement Left 02/23/2013    Procedure: INSERTION PORT-A-CATH;  Surgeon: Donato Heinz, MD;  Location: [facility];  Service:  General;  Laterality: Left;  . Port-a-cath removal Left 07/29/2013    Procedure: REMOVAL PORT-A-CATH;  Surgeon: Scherry Ran, MD;  Location: [facility];  Service: General;  Laterality: Left;  . Tee without cardioversion N/A 08/07/2013    Procedure: TRANSESOPHAGEAL ECHOCARDIOGRAM (TEE);  Surgeon: Arnoldo Lenis, MD;  Location[facility];  Service: Cardiology;  Laterality: N/A;   Family History  Problem Relation Age of Onset  . Sickle cell trait Mother   . Sickle cell trait Father   . Hypertension Father   . Diabetes Father   . Sickle cell trait Sister   . Cancer Paternal Aunt     Breast   Social History  Substance Use Topics  . Smoking status: Never Smoker   . Smokeless tobacco: Former Systems developer  . Alcohol Use: No   OB History    Gravida Para Term Preterm AB TAB SAB Ectopic Multiple Living   1 1  1      1      Review of Systems  Constitutional: Negative for fever and activity change.  HENT: Negative for congestion.   Respiratory: Positive for chest tightness. Negative for cough and shortness of breath.   Cardiovascular: Positive for chest pain. Negative for leg swelling.  Gastrointestinal: Negative for nausea, vomiting and abdominal pain.  Genitourinary: Negative for dysuria, hematuria, vaginal bleeding and vaginal discharge.  Musculoskeletal: Positive for myalgias and arthralgias. Negative for back pain.  Skin: Negative for  rash.  Neurological: Negative for dizziness, weakness, light-headedness and headaches.  A complete 10 system review of systems was obtained and all systems are negative except as noted in the HPI and PMH.      Allergies  Desferal; Latex; Lisinopril; and Tape  Home Medications   Prior to Admission medications   Medication Sig Start Date End Date Taking? Authorizing Provider  cetirizine (ZYRTEC) 10 MG tablet TAKE 1 TABLET (10 MG TOTAL) BY MOUTH DAILY. 05/06/15  Yes Historical Provider, MD  folic acid (FOLVITE) 1 MG tablet Take 1 tablet (1 mg total)  by mouth daily. 08/16/15  Yes [name], FNP  hydroxyurea (HYDREA) 500 MG capsule Take 2 capsules (1,000 mg total) by mouth daily. May take with food to minimize GI side effects. 08/16/15  Yes [name], FNP  ibuprofen (ADVIL,MOTRIN) 200 MG tablet Take 400 mg by mouth every 6 (six) hours as needed.   Yes Historical Provider, MD  Oxycodone HCl 10 MG TABS Take 1 tablet (10 mg total) by mouth every 4 (four) hours as needed. 08/16/15  Yes [name], FNP  promethazine (PHENERGAN) 12.5 MG tablet Take 1 tablet (12.5 mg total) by mouth every 6 (six) hours as needed for nausea. 05/23/15  Yes Micheline Chapman, NP  Vitamin D, Ergocalciferol, (DRISDOL) 50000 UNITS CAPS capsule Take 1 capsule (50,000 Units total) by mouth every 7 (seven) days. Takes on Sundays. 08/16/15  Yes [name], FNP   BP 132/95 mmHg  Pulse 87  Temp(Src) 97.9 F (36.6 C) (Oral)  Resp 18  Ht 5\' 3"  (1.6 m)  Wt 160 lb (72.576 kg)  BMI 28.35 kg/m2  SpO2 96% Physical Exam  Constitutional: She is oriented to person, place, and time. She appears well-developed and well-nourished. No distress.  HENT:  Head: Normocephalic and atraumatic.  Mouth/Throat: Oropharynx is clear and moist. No oropharyngeal exudate.  Eyes: Conjunctivae and EOM are normal. Pupils are equal, round, and reactive to light.  Neck: Normal range of motion. Neck supple.  No meningismus.  Cardiovascular: Normal rate, regular rhythm, normal heart sounds and intact distal pulses.   No murmur heard. Pulmonary/Chest: Effort normal and breath sounds normal. No respiratory distress. She exhibits tenderness.  Central chest tenderness, worse with palpation  Abdominal: Soft. There is no tenderness. There is no rebound and no guarding.  Musculoskeletal: Normal range of motion. She exhibits no edema or tenderness.  Neurological: She is alert and oriented to person, place, and time. No cranial nerve deficit. She exhibits normal muscle tone. Coordination  normal.  No ataxia on finger to nose bilaterally. No pronator drift. 5/5 strength throughout. CN 2-12 intact.Equal grip strength. Sensation intact.   Skin: Skin is warm.  Psychiatric: She has a normal mood and affect. Her behavior is normal.  Nursing note and vitals reviewed.   ED Course  Procedures (including critical care time) Labs Review Labs Reviewed  CBC WITH DIFFERENTIAL/PLATELET - Abnormal; Notable for the following:    RBC 2.07 (*)    Hemoglobin 7.7 (*)    HCT 21.3 (*)    MCV 102.9 (*)    MCH 37.2 (*)    MCHC 36.2 (*)    RDW 18.9 (*)    Monocytes Absolute 1.8 (*)    All other components within normal limits  COMPREHENSIVE METABOLIC PANEL - Abnormal; Notable for the following:    Creatinine, Ser 0.42 (*)    Calcium 8.8 (*)    Albumin 3.2 (*)    AST 91 (*)  Alkaline Phosphatase 143 (*)    Total Bilirubin 10.1 (*)    All other components within normal limits  RETICULOCYTES - Abnormal; Notable for the following:    Retic Ct Pct >23.0 (*)    RBC. 2.07 (*)    All other components within normal limits  URINALYSIS, ROUTINE W REFLEX MICROSCOPIC (NOT AT [facility]) - Abnormal; Notable for the following:    Color, Urine BROWN (*)    Hgb urine dipstick MODERATE (*)    Bilirubin Urine MODERATE (*)    Ketones, ur TRACE (*)    Protein, ur >300 (*)    Nitrite POSITIVE (*)    Leukocytes, UA TRACE (*)    All other components within normal limits  D-DIMER, QUANTITATIVE (NOT AT [facility]) - Abnormal; Notable for the following:    D-Dimer, Quant 3.35 (*)    All other components within normal limits  URINE MICROSCOPIC-ADD ON - Abnormal; Notable for the following:    Squamous Epithelial / LPF 6-30 (*)    Bacteria, UA MANY (*)    All other components within normal limits  LACTATE DEHYDROGENASE - Abnormal; Notable for the following:    LDH 449 (*)    All other components within normal limits  URINE CULTURE  PREGNANCY, URINE  TROPONIN I  RETICULOCYTES  CBC  COMPREHENSIVE METABOLIC  PANEL    Imaging Review Dg Chest 2 View  08/25/2015  CLINICAL DATA:  Chest pain and short of breath EXAM: CHEST  2 VIEW COMPARISON:  08/05/2015 FINDINGS: Normal heart size. Clear lungs. No pneumothorax. No pleural effusion. IMPRESSION: No active cardiopulmonary disease. Electronically Signed   By: Marybelle Killings M.D.   On: 08/25/2015 12:40   Ct Angio Chest Pe W/cm &/or Wo Cm  08/25/2015  CLINICAL DATA:  Chest pain since last night. States it feels like her sickle cell pain. Pulse ox 88% on room air at work. EXAM: CT ANGIOGRAPHY CHEST WITH CONTRAST TECHNIQUE: Multidetector CT imaging of the chest was performed using the standard protocol during bolus administration of intravenous contrast. Multiplanar CT image reconstructions and MIPs were obtained to evaluate the vascular anatomy. CONTRAST:  147mL OMNIPAQUE IOHEXOL 350 MG/ML SOLN COMPARISON:  Current chest radiograph FINDINGS: Angiographic study: No evidence of a pulmonary embolus. Great vessels are normal in caliber. No atherosclerotic plaque. Neck base and axilla:  No mass or adenopathy.  Normal thyroid. Mediastinum and hila: Heart normal in size and configuration. No masses or adenopathy. Lungs and pleura: Minimal dependent subsegmental atelectasis in the lung bases and lateral right upper lobe. The lungs are otherwise clear. No pleural effusion or pneumothorax. Limited upper abdomen: Morphologic changes of the liver are consistent with cirrhosis with surface nodularity, central volume loss and relative enlargement of the caudate lobe and lateral segment of the left lobe. No liver mass or focal lesion. Gallbladder surgically absent. Musculoskeletal: Irregular sclerosis is noted mostly along the mid to lower thoracic spine. Findings consistent with patient's reported history of sickle cell disease. No osteolytic lesions. Review of the MIP images confirms the above findings. IMPRESSION: 1. No evidence of a pulmonary embolus. 2. No acute findings.  Electronically Signed   By: Lajean Manes M.D.   On: 08/25/2015 14:04   I have personally reviewed and evaluated these images and lab results as part of my medical decision-making.   EKG Interpretation   Date/Time:  Thursday August 25 2015 12:05:49 EST Ventricular Rate:  93 PR Interval:  155 QRS Duration: 98 QT Interval:  394 QTC Calculation: 490 R  Axis:   64 Text Interpretation:  Sinus rhythm Probable inferior infarct, old Baseline  wander in lead(s) V2 V5 No significant change was found Confirmed by  Wyvonnia Dusky  MD, Zakaria Sedor [phone]) on 08/25/2015 12:20:46 PM      MDM   Final diagnoses:  Sickle cell crisis ([place])  Hypoxia   Ongoing chest pain since tonight, take typical sickle cell pain. Patient with borderline oxygenation and tachycardia. She does have Mirena for birth control. Low suspicion for ACS.  Hemoglobin appears to be stable. Chest x-ray is negative. EKG is nonischemic. Patient has hypoxia with birth control use, chest pain and tachycardia. D-dimer is positive.  CT negative for pulmonary embolism or other acute finding. Patient oxygenation is borderline about 89% at rest. It increases to mid 90s  She denies any shortness of breath with exertion.  Pain persists after 3 doses of dilaudid.  Hypoxia with ambulation and borderline O2 saturations at rest.  Bilirubin high.  Retic count adequate. Admission d/w Dr. Nehemiah Settle.    [name], MD 08/25/15 [phone]

## 2015-08-25 NOTE — Telephone Encounter (Signed)
Pt [facility]. Pt stated her pain was in her chest and her O2 sat was 89%. Pt stated has pain med was at 6am of Oxycodone. Pt denies fever, diarrhea, nausea and vomiting. Pt was told that I needed to check with the provider C.Smith Robert, NP and let her know. Pt was placed on hold and after speaking with the provider, pt was told to go to the emergency department. Pt voiced understanding and that she would go.

## 2015-08-25 NOTE — ED Notes (Signed)
Pt taken to XR.  

## 2015-08-25 NOTE — ED Notes (Signed)
Pt tolerated ambulation well with o2 sats at 87% and HR 95

## 2015-08-26 DIAGNOSIS — N3000 Acute cystitis without hematuria: Secondary | ICD-10-CM

## 2015-08-26 LAB — COMPREHENSIVE METABOLIC PANEL
ALBUMIN: 2.7 g/dL — AB (ref 3.5–5.0)
ALK PHOS: 123 U/L (ref 38–126)
ALT: 26 U/L (ref 14–54)
ANION GAP: 5 (ref 5–15)
AST: 63 U/L — ABNORMAL HIGH (ref 15–41)
BILIRUBIN TOTAL: 8.6 mg/dL — AB (ref 0.3–1.2)
BUN: 10 mg/dL (ref 6–20)
CALCIUM: 8.3 mg/dL — AB (ref 8.9–10.3)
CO2: 24 mmol/L (ref 22–32)
Chloride: 109 mmol/L (ref 101–111)
Creatinine, Ser: 0.48 mg/dL (ref 0.44–1.00)
GLUCOSE: 104 mg/dL — AB (ref 65–99)
POTASSIUM: 3.9 mmol/L (ref 3.5–5.1)
Sodium: 138 mmol/L (ref 135–145)
TOTAL PROTEIN: 7 g/dL (ref 6.5–8.1)

## 2015-08-26 LAB — CBC
HEMATOCRIT: 18.8 % — AB (ref 36.0–46.0)
HEMOGLOBIN: 6.8 g/dL — AB (ref 12.0–15.0)
MCH: 37.4 pg — AB (ref 26.0–34.0)
MCHC: 36.2 g/dL — AB (ref 30.0–36.0)
MCV: 103.3 fL — ABNORMAL HIGH (ref 78.0–100.0)
Platelets: 162 10*3/uL (ref 150–400)
RBC: 1.82 MIL/uL — ABNORMAL LOW (ref 3.87–5.11)
RDW: 18.3 % — AB (ref 11.5–15.5)
WBC: 8.9 10*3/uL (ref 4.0–10.5)

## 2015-08-26 LAB — RETICULOCYTES: RBC.: 1.82 MIL/uL — ABNORMAL LOW (ref 3.87–5.11)

## 2015-08-26 MED ORDER — PROMETHAZINE HCL 25 MG/ML IJ SOLN
12.5000 mg | Freq: Four times a day (QID) | INTRAMUSCULAR | Status: DC | PRN
  Administered 2015-08-26 – 2015-08-31 (×15): 12.5 mg via INTRAVENOUS
  Filled 2015-08-26 (×15): qty 1

## 2015-08-26 MED ORDER — HYDROMORPHONE HCL 1 MG/ML IJ SOLN
2.0000 mg | Freq: Once | INTRAMUSCULAR | Status: AC
  Administered 2015-08-26: 2 mg via INTRAVENOUS
  Filled 2015-08-26: qty 2

## 2015-08-26 MED ORDER — HYDROMORPHONE HCL 1 MG/ML IJ SOLN: 1.0000 mg | INTRAMUSCULAR | Status: DC | PRN

## 2015-08-26 NOTE — Care Management Important Message (Signed)
Important Message  Patient Details  Name: Dawn [name] MRN: [id number] Date of Birth: May 18, 1981   Medicare Important Message Given:  Yes    Joylene Draft, RN 08/26/2015, 3:23 PM

## 2015-08-26 NOTE — Progress Notes (Signed)
[facility] PROGRESS NOTE  GARNIE DERICO [id number] DOB: May 13, 1981 DOA: 08/25/2015 PCP: [name],[name], MD  Assessment/Plan: Sickle cell anemia with painful vaso-occlusive crisis -She states she is a little improved today. -We'll plan to continue Dilaudid PCA as well as adjunctive pain medications including but not limited to scheduled IV Toradol. -Hemoglobin is 6.8 today, will not transfuse at present.  UTI -Culture data is pending. -We'll continue Duricef that was ordered on admission.  Code Status: Full code Family Communication: Patient only  Disposition Plan: Home when medically stable   Consultants:  None   Antibiotics:  Duricef   Subjective: Feels a little improved although still has significant chest pain  Objective: Filed Vitals:   08/26/15 0335 08/26/15 0400 08/26/15 0547 08/26/15 0801  BP: 139/73     Pulse: 93     Temp: 98.9 F (37.2 C)     TempSrc: Oral     Resp: 18 20 20 16   Height:      Weight:      SpO2: 100% 99% 99% 97%    Intake/Output Summary (Last 24 hours) at 08/26/15 1119 Last data filed at 08/26/15 0500  Gross per 24 hour  Intake 897.92 ml  Output      0 ml  Net 897.92 ml   Filed Weights   08/25/15 1119  Weight: 72.576 kg (160 lb)    Exam:   General:  Alert, awake, oriented 3, pleasant and cooperative with exam  Cardiovascular: Regular rate and rhythm, no murmurs, rubs or gallops  Respiratory: Clear to auscultation bilaterally  Abdomen: Soft, nontender, nondistended, positive bowel sounds, no masses or organomegaly noted  Extremities: No clubbing, cyanosis or edema, positive pulses   Neurologic:  Grossly intact and nonfocal  Data Reviewed: Basic Metabolic Panel:  Recent Labs Lab 08/25/15 1204 08/26/15 0709  NA 137 138  K 4.1 3.9  CL 104 109  CO2 24 24  GLUCOSE 87 104*  BUN 7 10  CREATININE 0.42* 0.48  CALCIUM 8.8* 8.3*   Liver Function Tests:  Recent Labs Lab 08/25/15 1204  08/26/15 0709  AST 91* 63*  ALT 34 26  ALKPHOS 143* 123  BILITOT 10.1* 8.6*  PROT 8.0 7.0  ALBUMIN 3.2* 2.7*   No results for input(s): LIPASE, AMYLASE in the last 168 hours. No results for input(s): AMMONIA in the last 168 hours. CBC:  Recent Labs Lab 08/25/15 1204 08/26/15 0709  WBC 9.1 8.9  NEUTROABS 4.7  --   HGB 7.7* 6.8*  HCT 21.3* 18.8*  MCV 102.9* 103.3*  PLT 187 162   Cardiac Enzymes:  Recent Labs Lab 08/25/15 1204  TROPONINI <0.03   BNP (last 3 results) No results for input(s): BNP in the last 8760 hours.  ProBNP (last 3 results) No results for input(s): PROBNP in the last 8760 hours.  CBG: No results for input(s): GLUCAP in the last 168 hours.  No results found for this or any previous visit (from the past 240 hour(s)).   Studies: Dg Chest 2 View  08/25/2015  CLINICAL DATA:  Chest pain and short of breath EXAM: CHEST  2 VIEW COMPARISON:  08/05/2015 FINDINGS: Normal heart size. Clear lungs. No pneumothorax. No pleural effusion. IMPRESSION: No active cardiopulmonary disease. Electronically Signed   By: Marybelle Killings M.D.   On: 08/25/2015 12:40   Ct Angio Chest Pe W/cm &/or Wo Cm  08/25/2015  CLINICAL DATA:  Chest pain since last night. States it feels like her sickle cell pain. Pulse  ox 88% on room air at work. EXAM: CT ANGIOGRAPHY CHEST WITH CONTRAST TECHNIQUE: Multidetector CT imaging of the chest was performed using the standard protocol during bolus administration of intravenous contrast. Multiplanar CT image reconstructions and MIPs were obtained to evaluate the vascular anatomy. CONTRAST:  17mL OMNIPAQUE IOHEXOL 350 MG/ML SOLN COMPARISON:  Current chest radiograph FINDINGS: Angiographic study: No evidence of a pulmonary embolus. Great vessels are normal in caliber. No atherosclerotic plaque. Neck base and axilla:  No mass or adenopathy.  Normal thyroid. Mediastinum and hila: Heart normal in size and configuration. No masses or adenopathy. Lungs and  pleura: Minimal dependent subsegmental atelectasis in the lung bases and lateral right upper lobe. The lungs are otherwise clear. No pleural effusion or pneumothorax. Limited upper abdomen: Morphologic changes of the liver are consistent with cirrhosis with surface nodularity, central volume loss and relative enlargement of the caudate lobe and lateral segment of the left lobe. No liver mass or focal lesion. Gallbladder surgically absent. Musculoskeletal: Irregular sclerosis is noted mostly along the mid to lower thoracic spine. Findings consistent with patient's reported history of sickle cell disease. No osteolytic lesions. Review of the MIP images confirms the above findings. IMPRESSION: 1. No evidence of a pulmonary embolus. 2. No acute findings. Electronically Signed   By: Lajean Manes M.D.   On: 08/25/2015 14:04    Scheduled Meds: . cefadroxil  500 mg Oral Q12H  . enoxaparin (LOVENOX) injection  40 mg Subcutaneous Q24H  . folic acid  1 mg Oral Daily  . HYDROmorphone   Intravenous 6 times per day  . hydroxyurea  1,000 mg Oral Daily  . ketorolac  30 mg Intravenous 4 times per day  . senna-docusate  1 tablet Oral BID   Continuous Infusions: . sodium chloride 125 mL/hr at 08/26/15 0500    Principal Problem:   UTI (urinary tract infection) Active Problems:   Sickle cell crisis ([place])    Time spent: 25 minutes. Greater than 50% of this time was spent in direct contact with the patient coordinating care.    Lelon Frohlich  [facility] Pager [phone]  If 7PM-7AM, please contact night-coverage at [email or web address], password [facility] 08/26/2015, 11:19 AM  LOS: 1 day

## 2015-08-26 NOTE — Care Management Note (Signed)
Case Management Note  Patient Details  Name: Dawn [name] MRN: [id number] Date of Birth: Mar 15, 1981  Subjective/Objective:                  Pt admitted from home with sickle cell crisis. Pt lives with her son and will return home at discharge. Pt is independent with ADL's.  Action/Plan: No CM needs anticipated.  Expected Discharge Date:  08/27/15               Expected Discharge Plan:  Home/Self Care  In-House Referral:  NA  Discharge planning Services  CM Consult  Post Acute Care Choice:  NA Choice offered to:  NA  DME Arranged:    DME Agency:     HH Arranged:    HH Agency:     Status of Service:  Completed, signed off  Medicare Important Message Given:  Yes Date Medicare IM Given:    Medicare IM give by:    Date Additional Medicare IM Given:    Additional Medicare Important Message give by:     If discussed at [place] of Stay Meetings, dates discussed:    Additional Comments:  Joylene Draft, RN 08/26/2015, 3:23 PM

## 2015-08-27 ENCOUNTER — Inpatient Hospital Stay (HOSPITAL_COMMUNITY): Payer: BLUE CROSS/BLUE SHIELD

## 2015-08-27 DIAGNOSIS — N390 Urinary tract infection, site not specified: Secondary | ICD-10-CM

## 2015-08-27 LAB — CBC
HCT: 18.8 % — ABNORMAL LOW (ref 36.0–46.0)
Hemoglobin: 6.8 g/dL — CL (ref 12.0–15.0)
MCH: 37.8 pg — ABNORMAL HIGH (ref 26.0–34.0)
MCHC: 36.2 g/dL — ABNORMAL HIGH (ref 30.0–36.0)
MCV: 104.4 fL — ABNORMAL HIGH (ref 78.0–100.0)
Platelets: 176 10*3/uL (ref 150–400)
RBC: 1.8 MIL/uL — ABNORMAL LOW (ref 3.87–5.11)
RDW: 20.5 % — ABNORMAL HIGH (ref 11.5–15.5)
WBC: 11.3 10*3/uL — ABNORMAL HIGH (ref 4.0–10.5)

## 2015-08-27 LAB — RETICULOCYTES
RBC.: 1.8 MIL/uL — AB (ref 3.87–5.11)
RETIC COUNT ABSOLUTE: 559.8 10*3/uL — AB (ref 19.0–186.0)
RETIC CT PCT: 31.1 % — AB (ref 0.4–3.1)

## 2015-08-27 LAB — PREGNANCY, URINE: PREG TEST UR: NEGATIVE

## 2015-08-27 MED ORDER — CIPROFLOXACIN IN D5W 400 MG/200ML IV SOLN
400.0000 mg | Freq: Two times a day (BID) | INTRAVENOUS | Status: DC
  Administered 2015-08-27 – 2015-08-28 (×2): 400 mg via INTRAVENOUS
  Filled 2015-08-27 (×2): qty 200

## 2015-08-27 MED ORDER — VANCOMYCIN HCL 10 G IV SOLR
1250.0000 mg | Freq: Once | INTRAVENOUS | Status: AC
  Administered 2015-08-27: 1250 mg via INTRAVENOUS
  Filled 2015-08-27: qty 1250

## 2015-08-27 MED ORDER — HYDROMORPHONE 1 MG/ML IV SOLN
INTRAVENOUS | Status: DC
  Administered 2015-08-27: 5 mg via INTRAVENOUS
  Administered 2015-08-27: 3 mg via INTRAVENOUS
  Administered 2015-08-28: 2 mg via INTRAVENOUS
  Administered 2015-08-28: 8.5 mL via INTRAVENOUS
  Administered 2015-08-28: 4 mg via INTRAVENOUS
  Administered 2015-08-28: 0.5 mL via INTRAVENOUS
  Administered 2015-08-28: 02:00:00 via INTRAVENOUS
  Administered 2015-08-29: 3.2 mg via INTRAVENOUS
  Administered 2015-08-29: 2.5 mg via INTRAVENOUS
  Filled 2015-08-27 (×2): qty 25

## 2015-08-27 MED ORDER — VANCOMYCIN HCL IN DEXTROSE 750-5 MG/150ML-% IV SOLN
750.0000 mg | Freq: Two times a day (BID) | INTRAVENOUS | Status: DC
  Filled 2015-08-27 (×3): qty 150

## 2015-08-27 NOTE — Progress Notes (Signed)
Dr. Caryn Section called back with orders to discontinue lovenox and start SCDs for VTE prophylaxis. Notified night shift RN as well. Donavan Foil, RN

## 2015-08-27 NOTE — Progress Notes (Signed)
ANTIBIOTIC CONSULT NOTE - INITIAL  Pharmacy Consult for Vancomycin Indication: fever  Allergies  Allergen Reactions  . Desferal [Deferoxamine] Hives    Local reaction on arm only during infusion. Can take with benadryl   . Latex Other (See Comments)    REACTION: Pt experiences a burning sensation on contacted skin areas  . Lisinopril Other (See Comments) and Cough    REACTION: Sore/scratchy throat  . Tape Other (See Comments)    REACTION: Pt. Experiences a burning sensation on contacted skin areas    Patient Measurements: Height: 5\' 3"  (160 cm) Weight: 160 lb (72.576 kg) IBW/kg (Calculated) : 52.4  Vital Signs: Temp: 102.9 F (39.4 C) (12/03 1428) Temp Source: Oral (12/03 1428) BP: 125/65 mmHg (12/03 1219) Intake/Output from previous day: 12/02 0701 - 12/03 0700 In: 720 [P.O.:720] Out: -  Intake/Output from this shift: Total I/O In: 589 [I.V.:589] Out: -   Labs:  Recent Labs  08/25/15 1204 08/26/15 0709 08/27/15 0643  WBC 9.1 8.9 11.3*  HGB 7.7* 6.8* 6.8*  PLT 187 162 176  CREATININE 0.42* 0.48  --    Estimated Creatinine Clearance: 94.6 mL/min (by C-G formula based on Cr of 0.48). No results for input(s): VANCOTROUGH, VANCOPEAK, VANCORANDOM, GENTTROUGH, GENTPEAK, GENTRANDOM, TOBRATROUGH, TOBRAPEAK, TOBRARND, AMIKACINPEAK, AMIKACINTROU, AMIKACIN in the last 72 hours.   Microbiology: Recent Results (from the past 720 hour(s))  Urine culture     Status: None   Collection Time: 08/05/15  8:29 PM  Result Value Ref Range Status   Specimen Description URINE, CLEAN CATCH  Final   Special Requests Normal  Final   Culture   Final    70,000 COLONIES/ml ESCHERICHIA COLI Performed at [facility]    Report Status 08/09/2015 FINAL  Final   Organism ID, Bacteria ESCHERICHIA COLI  Final      Susceptibility   Escherichia coli - MIC*    AMPICILLIN 4 SENSITIVE Sensitive     CEFAZOLIN <=4 SENSITIVE Sensitive     CEFTRIAXONE <=1 SENSITIVE Sensitive    CIPROFLOXACIN <=0.25 SENSITIVE Sensitive     GENTAMICIN <=1 SENSITIVE Sensitive     IMIPENEM <=0.25 SENSITIVE Sensitive     NITROFURANTOIN <=16 SENSITIVE Sensitive     TRIMETH/SULFA <=20 SENSITIVE Sensitive     AMPICILLIN/SULBACTAM 4 SENSITIVE Sensitive     PIP/TAZO <=4 SENSITIVE Sensitive     * 70,000 COLONIES/ml ESCHERICHIA COLI  Culture, Urine     Status: None (Preliminary result)   Collection Time: 08/25/15 12:01 PM  Result Value Ref Range Status   Specimen Description URINE, RANDOM  Final   Special Requests NONE  Final   Culture   Final    >=100,000 COLONIES/mL ESCHERICHIA COLI Performed at [facility]    Report Status PENDING  Incomplete    Medical History: Past Medical History  Diagnosis Date  . Sickle cell disease ([place])   . 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 Blood transfusion   . 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 HCAP (healthcare-associated pneumonia) 05/19/2013  . Sickle cell anemia (H[facility])   . 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 Vitamin B12 deficiency 07/07/2015   Assessment: 34yo female with good renal fxn.  Noted to have fever of 102.9.  Asked to initiate Vancomycin.   Anti-infectives    Start     Dose/Rate Route Frequency Ordered Stop   08/28/15 0600  vancomycin (VANCOCIN) IVPB 750 mg/150 ml premix     750 mg 150 mL/hr over 60 Minutes Intravenous Every 12  hours 08/27/15 1440     08/27/15 1600  ciprofloxacin (CIPRO) IVPB 400 mg     400 mg 200 mL/hr over 60 Minutes Intravenous Every 12 hours 08/27/15 1436     08/27/15 1600  vancomycin (VANCOCIN) 1,250 mg in sodium chloride 0.9 % 250 mL IVPB     1,250 mg 166.7 mL/hr over 90 Minutes Intravenous  Once 08/27/15 1440     08/25/15 2200  cefadroxil (DURICEF) capsule 500 mg  Status:  Discontinued     500 mg Oral Every 12 hours 08/25/15 2028 08/27/15 1437     Goal of Therapy:  Vancomycin trough level 15-20 mcg/ml  Plan:  Vancomycin 1250mg  IV now x 1 then Vancomycin 750mg  IV q12hrs Check  trough at steady state Monitor labs, renal fxn, progress and c/s  Hart Robinsons A 08/27/2015,2:41 PM

## 2015-08-27 NOTE — Plan of Care (Signed)
Problem: Pain Managment: Goal: General experience of comfort will improve Outcome: Progressing PCA adjusted to custom dose per Dr. Maralyn Sago orders. Patient states comfortable with PCA use.

## 2015-08-27 NOTE — Progress Notes (Signed)
Patient stated she preferred not to have lovenox this evening. Stated last admission she developed a nosebleed that had to be packed after receiving lovenox during stay. Text-paged Dr. Caryn Section to notify. Donavan Foil, RN

## 2015-08-27 NOTE — Progress Notes (Addendum)
[facility] PROGRESS NOTE  Dawn [name] [id number] DOB: 06-12-1981 DOA: 08/25/2015 PCP: [name],[name], MD    Code Status: Full code Family Communication: Discussed with patient; family not available Disposition Plan: Discharge when clinically appropriate   Consultants:  Curbside consult with patient's PCP, Dr. Zigmund Daniel  Procedures:  None  Antibiotics:  Cipro 08/27/15>>  Vancomycin 08/27/15>>  Duricef>> 08/27/15  HPI/Subjective: Patient says that overall, she does not feel well. She has some lower back pain and is concerned about a possibility of being pregnant, although she has an IUD. She denies nausea or vomiting. She denies diarrhea. She denies pain with urination, but has had some urinary frequency. She denies purulent cough. She does have some right sided chest pain which is her typical sickle cell pain.  Objective: Filed Vitals:   08/27/15 1219 08/27/15 1428  BP: 125/65   Pulse:    Temp: 99.5 F (37.5 C) 102.9 F (39.4 C)  Resp: 20     Intake/Output Summary (Last 24 hours) at 08/27/15 1432 Last data filed at 08/27/15 1218  Gross per 24 hour  Intake    778 ml  Output      0 ml  Net    778 ml   Filed Weights   08/25/15 1119  Weight: 72.576 kg (160 lb)    Exam:   General:  [name] in no acute distress.  Cardiovascular: S1, S2, with soft systolic murmur.  Respiratory: Clear to auscultation bilaterally with no wheezes or crackles or rhonchi.  Abdomen: Positive bowel sounds, soft, nontender, nondistended.  Musculoskeletal/extremities: Mild tenderness over the chest wall muscles on the right with no erythema or edema; mild tenderness over the proximal and distal legs without any acute hot red joints.   Data Reviewed: Basic Metabolic Panel:  Recent Labs Lab 08/25/15 1204 08/26/15 0709  NA 137 138  K 4.1 3.9  CL 104 109  CO2 24 24  GLUCOSE 87 104*  BUN 7 10  CREATININE 0.42* 0.48  CALCIUM 8.8* 8.3*    Liver Function Tests:  Recent Labs Lab 08/25/15 1204 08/26/15 0709  AST 91* 63*  ALT 34 26  ALKPHOS 143* 123  BILITOT 10.1* 8.6*  PROT 8.0 7.0  ALBUMIN 3.2* 2.7*   No results for input(s): LIPASE, AMYLASE in the last 168 hours. No results for input(s): AMMONIA in the last 168 hours. CBC:  Recent Labs Lab 08/25/15 1204 08/26/15 0709 08/27/15 0643  WBC 9.1 8.9 11.3*  NEUTROABS 4.7  --   --   HGB 7.7* 6.8* 6.8*  HCT 21.3* 18.8* 18.8*  MCV 102.9* 103.3* 104.4*  PLT 187 162 176   Cardiac Enzymes:  Recent Labs Lab 08/25/15 1204  TROPONINI <0.03   BNP (last 3 results) No results for input(s): BNP in the last 8760 hours.  ProBNP (last 3 results) No results for input(s): PROBNP in the last 8760 hours.  CBG: No results for input(s): GLUCAP in the last 168 hours.  Recent Results (from the past 240 hour(s))  Culture, Urine     Status: None (Preliminary result)   Collection Time: 08/25/15 12:01 PM  Result Value Ref Range Status   Specimen Description URINE, RANDOM  Final   Special Requests NONE  Final   Culture   Final    >=100,000 COLONIES/mL ESCHERICHIA COLI Performed at [facility]    Report Status PENDING  Incomplete     Studies: No results found.  Scheduled Meds: . cefadroxil  500 mg Oral Q12H  .  enoxaparin (LOVENOX) injection  40 mg Subcutaneous Q24H  . folic acid  1 mg Oral Daily  . HYDROmorphone   Intravenous 6 times per day  . hydroxyurea  1,000 mg Oral Daily  . ketorolac  30 mg Intravenous 4 times per day  . senna-docusate  1 tablet Oral BID   Continuous Infusions: . sodium chloride 125 mL/hr at 08/27/15 1218   Assessment and plan:  Principal Problem:   Sickle cell crisis [facility]) Active Problems:   Sickle cell anemia (HCC)   UTI (urinary tract infection)   1. Sickle cell crisis. The patient was started on a Dilaudid PCA pump and vigorous IV fluids. Hydroxyurea and folic acid were continued. Toradol was added schedule. The  patient's pain was not adequately managed, so with the help of her PCP Dr. Liston Alba, custom Dilaudid PCA regimen was ordered. Patient says that it has helped.  -We'll continue current management of sickle cell crisis.  Sickle cell anemia with macrocytosis.  The patient's hemoglobin was 7.7 on admission. It has fallen to 6.8. We'll continue to monitor. No need for transfusion yet. Discussed with Dr. Zigmund Daniel who agreed. -Patient had history of vitamin B12 deficiency. However outpatient labs revealed a normal vitamin B12 of 570 on 08/16/15.  Escherichia coli urinary tract infection. Patient urinalysis on admission was consistent with UTI. She was started on Duricef. The patient's urine culture is growing out Escherichia coli, but the sensitivities are pending. -In light of the fever, will discontinue Duricef and start vancomycin and Cipro empirically. -We'll await the results of the sensitivities.  Worsening fever. The patient had just developed a temperature of 102.9. She denies diarrhea or purulent cough.  -Nevertheless, will order a chest x-ray. Will order blood cultures. -We'll start Cipro and vancomycin empirically.  Elevated LFTs and hyperbilirubinemia. Etiology from sickling RBCs. Will continue to follow.  Per request, the patient wanted a urine pregnancy test ordered although she has an IUD. Urine pregnancy test ordered.    Time spent: 35 minutes    [facility] Pager [phone]. If 7PM-7AM, please contact night-coverage at [email or web address], password [facility] 08/27/2015, 2:32 PM  LOS: 2 days

## 2015-08-27 NOTE — Progress Notes (Signed)
CRITICAL VALUE ALERT  Critical value received:  Hemoglobin 6.8  Date of notification:  08/27/15  Time of notification:  0730  Critical value read back:Yes.    Nurse Receiving Alert: Donavan Foil, RN  MD notified (1st page):  Dr Caryn Section text paged  Time of first page:  0750  MD notified (2nd page):  Time of second page:  Responding MD:    Time MD responded:

## 2015-08-28 LAB — URINE CULTURE: Culture: >=100000

## 2015-08-28 LAB — COMPREHENSIVE METABOLIC PANEL
ALBUMIN: 2.5 g/dL — AB (ref 3.5–5.0)
ALT: 25 U/L (ref 14–54)
ANION GAP: 5 (ref 5–15)
AST: 65 U/L — ABNORMAL HIGH (ref 15–41)
Alkaline Phosphatase: 114 U/L (ref 38–126)
BUN: 7 mg/dL (ref 6–20)
CO2: 25 mmol/L (ref 22–32)
Calcium: 8.1 mg/dL — ABNORMAL LOW (ref 8.9–10.3)
Chloride: 109 mmol/L (ref 101–111)
Creatinine, Ser: <0.3 mg/dL — ABNORMAL LOW (ref 0.44–1.00)
GLUCOSE: 122 mg/dL — AB (ref 65–99)
POTASSIUM: 3.6 mmol/L (ref 3.5–5.1)
SODIUM: 139 mmol/L (ref 135–145)
TOTAL PROTEIN: 6.4 g/dL — AB (ref 6.5–8.1)
Total Bilirubin: 9.2 mg/dL — ABNORMAL HIGH (ref 0.3–1.2)

## 2015-08-28 LAB — CBC
HCT: 17 % — ABNORMAL LOW (ref 36.0–46.0)
Hemoglobin: 6.2 g/dL — CL (ref 12.0–15.0)
MCH: 38 pg — ABNORMAL HIGH (ref 26.0–34.0)
MCHC: 36.5 g/dL — AB (ref 30.0–36.0)
MCV: 104.3 fL — ABNORMAL HIGH (ref 78.0–100.0)
PLATELETS: 157 10*3/uL (ref 150–400)
RBC: 1.63 MIL/uL — ABNORMAL LOW (ref 3.87–5.11)
RDW: 19.7 % — AB (ref 11.5–15.5)
WBC: 10.1 10*3/uL (ref 4.0–10.5)

## 2015-08-28 LAB — RETICULOCYTES
RBC.: 1.63 MIL/uL — AB (ref 3.87–5.11)
Retic Ct Pct: >23 % — ABNORMAL HIGH (ref 0.4–3.1)

## 2015-08-28 MED ORDER — HYDROMORPHONE HCL 1 MG/ML IJ SOLN
1.0000 mg | INTRAMUSCULAR | Status: DC | PRN
  Administered 2015-08-28 – 2015-08-29 (×5): 1 mg via INTRAVENOUS
  Filled 2015-08-28 (×5): qty 1

## 2015-08-28 MED ORDER — CIPROFLOXACIN HCL 250 MG PO TABS
500.0000 mg | ORAL_TABLET | Freq: Two times a day (BID) | ORAL | Status: DC
  Administered 2015-08-28 – 2015-08-31 (×7): 500 mg via ORAL
  Filled 2015-08-28 (×7): qty 2

## 2015-08-28 MED ORDER — OXYCODONE HCL 5 MG PO TABS: 5.0000 mg | ORAL_TABLET | ORAL | Status: DC | PRN

## 2015-08-28 NOTE — Progress Notes (Signed)
[facility] PROGRESS NOTE  Dawn [name] [id number] DOB: 10/18/1980 DOA: 08/25/2015 PCP: [name],[name], MD    Code Status: Full code Family Communication: Discussed with patient; family not available Disposition Plan: Discharge when clinically appropriate   Consultants:  Curbside consult with patient's PCP, Dr. Zigmund Daniel  Procedures:  None  Antibiotics:  Cipro 08/27/15>>  Vancomycin 08/27/15>>  Duricef>> 08/27/15  HPI/Subjective: Today she reports feeling better, states pain for the most part is controlled. She is tolerating by mouth intake, denies fevers chills nausea or vomiting.  Objective: Filed Vitals:   08/28/15 0635 08/28/15 0800  BP: 112/81   Pulse: 94   Temp: 98.3 F (36.8 C)   Resp: 20 14    Intake/Output Summary (Last 24 hours) at 08/28/15 1224 Last data filed at 08/28/15 0900  Gross per 24 hour  Intake   1011 ml  Output   1250 ml  Net   -239 ml   Filed Weights   08/25/15 1119  Weight: 72.576 kg (160 lb)    Exam:   General:  [name] in no acute distress.  Cardiovascular: S1, S2, with soft systolic murmur.  Respiratory: Clear to auscultation bilaterally with no wheezes or crackles or rhonchi.  Abdomen: Positive bowel sounds, soft, nontender, nondistended.  Musculoskeletal/extremities: Mild tenderness over the chest wall muscles on the right with no erythema or edema; mild tenderness over the proximal and distal legs without any acute hot red joints.   Data Reviewed: Basic Metabolic Panel:  Recent Labs Lab 08/25/15 1204 08/26/15 0709 08/28/15 0634  NA 137 138 139  K 4.1 3.9 3.6  CL 104 109 109  CO2 24 24 25   GLUCOSE 87 104* 122*  BUN 7 10 7   CREATININE 0.42* 0.48 <0.30*  CALCIUM 8.8* 8.3* 8.1*   Liver Function Tests:  Recent Labs Lab 08/25/15 1204 08/26/15 0709 08/28/15 0634  AST 91* 63* 65*  ALT 34 26 25  ALKPHOS 143* 123 114  BILITOT 10.1* 8.6* 9.2*  PROT 8.0 7.0 6.4*  ALBUMIN  3.2* 2.7* 2.5*   No results for input(s): LIPASE, AMYLASE in the last 168 hours. No results for input(s): AMMONIA in the last 168 hours. CBC:  Recent Labs Lab 08/25/15 1204 08/26/15 0709 08/27/15 0643 08/28/15 0634  WBC 9.1 8.9 11.3* 10.1  NEUTROABS 4.7  --   --   --   HGB 7.7* 6.8* 6.8* 6.2*  HCT 21.3* 18.8* 18.8* 17.0*  MCV 102.9* 103.3* 104.4* 104.3*  PLT 187 162 176 157   Cardiac Enzymes:  Recent Labs Lab 08/25/15 1204  TROPONINI <0.03   BNP (last 3 results) No results for input(s): BNP in the last 8760 hours.  ProBNP (last 3 results) No results for input(s): PROBNP in the last 8760 hours.  CBG: No results for input(s): GLUCAP in the last 168 hours.  Recent Results (from the past 240 hour(s))  Culture, Urine     Status: None   Collection Time: 08/25/15 12:01 PM  Result Value Ref Range Status   Specimen Description URINE, RANDOM  Final   Special Requests NONE  Final   Culture   Final    >=100,000 COLONIES/mL ESCHERICHIA COLI Performed at [facility]    Report Status 08/28/2015 FINAL  Final   Organism ID, Bacteria ESCHERICHIA COLI  Final      Susceptibility   Escherichia coli - MIC*    AMPICILLIN 4 SENSITIVE Sensitive     CEFAZOLIN <=4 SENSITIVE Sensitive     CEFTRIAXONE <=1  SENSITIVE Sensitive     CIPROFLOXACIN <=0.25 SENSITIVE Sensitive     GENTAMICIN <=1 SENSITIVE Sensitive     IMIPENEM <=0.25 SENSITIVE Sensitive     NITROFURANTOIN <=16 SENSITIVE Sensitive     TRIMETH/SULFA <=20 SENSITIVE Sensitive     AMPICILLIN/SULBACTAM 4 SENSITIVE Sensitive     PIP/TAZO <=4 SENSITIVE Sensitive     * >=100,000 COLONIES/mL ESCHERICHIA COLI  Culture, blood (routine x 2)     Status: None (Preliminary result)   Collection Time: 08/27/15  3:20 PM  Result Value Ref Range Status   Specimen Description BLOOD BLOOD LEFT HAND  Final   Special Requests BOTTLES DRAWN AEROBIC AND ANAEROBIC 4CC  Final   Culture PENDING  Incomplete   Report Status PENDING  Incomplete   Culture, blood (routine x 2)     Status: None (Preliminary result)   Collection Time: 08/27/15  3:38 PM  Result Value Ref Range Status   Specimen Description BLOOD BLOOD RIGHT HAND  Final   Special Requests BOTTLES DRAWN AEROBIC AND ANAEROBIC 4CC  Final   Culture PENDING  Incomplete   Report Status PENDING  Incomplete     Studies: Dg Chest 2 View  08/27/2015  CLINICAL DATA:  Fever.  Chest pain.  Sickle cell crisis. EXAM: CHEST  2 VIEW COMPARISON:  08/25/2015 chest radiograph. FINDINGS: Stable cardiomediastinal silhouette with top-normal heart size. No pneumothorax. New small left pleural effusion. No pulmonary edema. Mild curvilinear opacities at both lung bases. IMPRESSION: 1. New small left pleural effusion. 2. Mild curvilinear opacities at both lung bases, favor atelectasis. Electronically Signed   By: Ilona Sorrel M.D.   On: 08/27/2015 15:05    Scheduled Meds: . ciprofloxacin  500 mg Oral BID  . folic acid  1 mg Oral Daily  . HYDROmorphone   Intravenous 6 times per day  . hydroxyurea  1,000 mg Oral Daily  . ketorolac  30 mg Intravenous 4 times per day  . senna-docusate  1 tablet Oral BID   Continuous Infusions:   Assessment and plan:  Principal Problem:   Sickle cell crisis (HCC) Active Problems:   Sickle cell anemia (HCC)   UTI (urinary tract infection)   1. Sickle cell crisis. The patient was started on a Dilaudid PCA pump and vigorous IV fluids. Hydroxyurea and folic acid were continued. Toradol was added schedule. The patient's pain was not adequately managed, so with the help of her PCP Dr. Liston Alba, custom Dilaudid PCA regimen was ordered. Patient says that it has helped.  -On 08/28/2015 pressure reporting an improvement to pain symptoms. We will de-escalate IV narcotics by decreasing her Dilantin dose from 2 mg IV every 3 hours as needed to 1 mg IV every 3 hours as needed. Will start oxycodone 5 mg every 4 hours as needed for breakthrough pain.  Sickle cell  anemia with macrocytosis.  The patient's hemoglobin was 7.7 on admission. It has fallen to 6.8. We'll continue to monitor. No need for transfusion yet. Discussed with Dr. Zigmund Daniel who agreed. -08/28/2015: Labs showing hemoglobin of 6.2, she is asymptomatic. Will hold blood transfusion   Escherichia coli urinary tract infection. Patient urinalysis on admission was consistent with UTI. She was started on Duricef. The patient's urine culture is growing out Escherichia coli, but the sensitivities are pending. -08/28/2015: She had urine cultures drawn on 08/25/2015 that grew Escherichia coli, organism susceptible to all antimicrobials tested. Will change to oral ciprofloxacin 500 mg by mouth twice a day  Febrile Illness. The patient had  just developed a temperature of 102.9. She denies diarrhea or purulent cough.  -Chest x-ray showed findings consistent with atelectasis. Suspect UTI to be infectious source. Urine cultures show susceptibility to fluoroquinolones, now on ciprofloxacin 500 mg by mouth twice a day  Elevated LFTs and hyperbilirubinemia. Etiology from sickling RBCs. Will continue to follow.  Per request, the patient wanted a urine pregnancy test ordered although she has an IUD. Urine pregnancy test ordered.    Time spent: 30 minutes    Kelvin Cellar  [facility] Pager [phone]. If 7PM-7AM, please contact night-coverage at [email or web address], password [facility] 08/28/2015, 12:24 PM  LOS: 3 days

## 2015-08-28 NOTE — Progress Notes (Signed)
MD notified/aware of pt Hgb level. No orders placed. Oswald Hillock, RN

## 2015-08-28 NOTE — Progress Notes (Signed)
CRITICAL VALUE ALERT  Critical value received:  Hemoglobin 6.2  Date of notification:  08/28/15  Time of notification:  0715  Critical value read back:Yes.    Nurse who received alert:  Theola Sequin RN   MD notified (1st page):  MD Coralyn Pear  Time of first page:  0730  MD notified (2nd page): MD Coralyn Pear   Time of second page: 0945  Responding MD:  MD Coralyn Pear  Time MD responded:  [phone]

## 2015-08-29 LAB — CBC
HCT: 15.5 % — ABNORMAL LOW (ref 36.0–46.0)
HEMOGLOBIN: 5.5 g/dL — AB (ref 12.0–15.0)
MCH: 37.2 pg — AB (ref 26.0–34.0)
MCHC: 35.5 g/dL (ref 30.0–36.0)
MCV: 104.7 fL — ABNORMAL HIGH (ref 78.0–100.0)
PLATELETS: 146 10*3/uL — AB (ref 150–400)
RBC: 1.48 MIL/uL — AB (ref 3.87–5.11)
RDW: 20.2 % — ABNORMAL HIGH (ref 11.5–15.5)
WBC: 11 10*3/uL — ABNORMAL HIGH (ref 4.0–10.5)

## 2015-08-29 LAB — BASIC METABOLIC PANEL
Anion gap: 5 (ref 5–15)
BUN: 8 mg/dL (ref 6–20)
CHLORIDE: 109 mmol/L (ref 101–111)
CO2: 25 mmol/L (ref 22–32)
CREATININE: 0.32 mg/dL — AB (ref 0.44–1.00)
Calcium: 7.8 mg/dL — ABNORMAL LOW (ref 8.9–10.3)
Glucose, Bld: 100 mg/dL — ABNORMAL HIGH (ref 65–99)
POTASSIUM: 3.8 mmol/L (ref 3.5–5.1)
SODIUM: 139 mmol/L (ref 135–145)

## 2015-08-29 LAB — RETICULOCYTES
RBC.: 1.48 MIL/uL — ABNORMAL LOW (ref 3.87–5.11)
Retic Ct Pct: >23 % — ABNORMAL HIGH (ref 0.4–3.1)

## 2015-08-29 LAB — PREPARE RBC (CROSSMATCH)

## 2015-08-29 MED ORDER — SODIUM CHLORIDE 0.45 % IV SOLN
INTRAVENOUS | Status: DC
  Administered 2015-08-29 – 2015-08-30 (×4): via INTRAVENOUS

## 2015-08-29 MED ORDER — HYDROMORPHONE HCL 1 MG/ML IJ SOLN
1.0000 mg | INTRAMUSCULAR | Status: DC | PRN
  Administered 2015-08-29: 1 mg via INTRAVENOUS
  Administered 2015-08-29 – 2015-08-30 (×2): 2 mg via INTRAVENOUS
  Administered 2015-08-30: 1 mg via INTRAVENOUS
  Filled 2015-08-29: qty 1
  Filled 2015-08-29 (×3): qty 2
  Filled 2015-08-29 (×2): qty 1

## 2015-08-29 MED ORDER — OXYCODONE HCL ER 15 MG PO T12A
15.0000 mg | EXTENDED_RELEASE_TABLET | Freq: Two times a day (BID) | ORAL | Status: DC
  Administered 2015-08-29 – 2015-08-30 (×3): 15 mg via ORAL
  Filled 2015-08-29 (×3): qty 1

## 2015-08-29 MED ORDER — SODIUM CHLORIDE 0.9 % IV SOLN
Freq: Once | INTRAVENOUS | Status: AC
Start: 2015-08-29 — End: 2015-08-29
  Administered 2015-08-29: 17:00:00 via INTRAVENOUS

## 2015-08-29 MED ORDER — OXYCODONE HCL 5 MG PO TABS
10.0000 mg | ORAL_TABLET | ORAL | Status: DC | PRN
  Administered 2015-08-29 – 2015-08-31 (×7): 10 mg via ORAL
  Filled 2015-08-29 (×8): qty 2

## 2015-08-29 MED ORDER — HYDROMORPHONE 1 MG/ML IV SOLN: INTRAVENOUS | Status: DC

## 2015-08-29 NOTE — Progress Notes (Addendum)
[name] PROGRESS NOTE  YA RIZZOTTI [id number] DOB: [birth year] DOA: 08/25/2015 PCP: [name],[name], MD    Code Status: Full code Family Communication: Discussed with patient; family not available Disposition Plan: Discharge when clinically appropriate   Consultants:  Curbside consult with patient's PCP, Dr. Zigmund Daniel  Procedures:  None  Antibiotics:  Cipro 08/27/15>>  Vancomycin 08/27/15>>  Duricef>> 08/27/15  HPI/Subjective: Today she reports feeling better, states pain for the most part is controlled. She is tolerating by mouth intake, denies fevers chills nausea or vomiting.  Objective: Filed Vitals:   08/29/15 0400 08/29/15 0647  BP: 132/68   Pulse: 93   Temp: 98.2 F (36.8 C)   Resp: 14 16    Intake/Output Summary (Last 24 hours) at 08/29/15 0915 Last data filed at 08/28/15 2100  Gross per 24 hour  Intake    240 ml  Output      0 ml  Net    240 ml   Filed Weights   08/25/15 1119  Weight: 72.576 kg (160 lb)    Exam:   General:  [name] in no acute distress.  Cardiovascular: S1, S2, with soft systolic murmur.  Respiratory: Clear to auscultation bilaterally with no wheezes or crackles or rhonchi.  Abdomen: Positive bowel sounds, soft, nontender, nondistended.  Musculoskeletal/extremities: Mild tenderness over the chest wall muscles on the right with no erythema or edema; mild tenderness over the proximal and distal legs without any acute hot red joints.   Data Reviewed: Basic Metabolic Panel:  Recent Labs Lab 08/25/15 1204 08/26/15 0709 08/28/15 0634 08/29/15 0802  NA 137 138 139 139  K 4.1 3.9 3.6 3.8  CL 104 109 109 109  CO2 24 24 25 25   GLUCOSE 87 104* 122* 100*  BUN 7 10 7 8   CREATININE 0.42* 0.48 <0.30* 0.32*  CALCIUM 8.8* 8.3* 8.1* 7.8*   Liver Function Tests:  Recent Labs Lab 08/25/15 1204 08/26/15 0709 08/28/15 0634  AST 91* 63* 65*  ALT 34 26 25  ALKPHOS 143* 123 114   BILITOT 10.1* 8.6* 9.2*  PROT 8.0 7.0 6.4*  ALBUMIN 3.2* 2.7* 2.5*   No results for input(s): LIPASE, AMYLASE in the last 168 hours. No results for input(s): AMMONIA in the last 168 hours. CBC:  Recent Labs Lab 08/25/15 1204 08/26/15 0709 08/27/15 0643 08/28/15 0634 08/29/15 0802  WBC 9.1 8.9 11.3* 10.1 11.0*  NEUTROABS 4.7  --   --   --   --   HGB 7.7* 6.8* 6.8* 6.2* 5.5*  HCT 21.3* 18.8* 18.8* 17.0* 15.5*  MCV 102.9* 103.3* 104.4* 104.3* 104.7*  PLT 187 162 176 157 146*   Cardiac Enzymes:  Recent Labs Lab 08/25/15 1204  TROPONINI <0.03   BNP (last 3 results) No results for input(s): BNP in the last 8760 hours.  ProBNP (last 3 results) No results for input(s): PROBNP in the last 8760 hours.  CBG: No results for input(s): GLUCAP in the last 168 hours.  Recent Results (from the past 240 hour(s))  Culture, Urine     Status: None   Collection Time: 08/25/15 12:01 PM  Result Value Ref Range Status   Specimen Description URINE, RANDOM  Final   Special Requests NONE  Final   Culture   Final    >=100,000 COLONIES/mL ESCHERICHIA COLI Performed at [facility]    Report Status 08/28/2015 FINAL  Final   Organism ID, Bacteria ESCHERICHIA COLI  Final      Susceptibility  Escherichia coli - MIC*    AMPICILLIN 4 SENSITIVE Sensitive     CEFAZOLIN <=4 SENSITIVE Sensitive     CEFTRIAXONE <=1 SENSITIVE Sensitive     CIPROFLOXACIN <=0.25 SENSITIVE Sensitive     GENTAMICIN <=1 SENSITIVE Sensitive     IMIPENEM <=0.25 SENSITIVE Sensitive     NITROFURANTOIN <=16 SENSITIVE Sensitive     TRIMETH/SULFA <=20 SENSITIVE Sensitive     AMPICILLIN/SULBACTAM 4 SENSITIVE Sensitive     PIP/TAZO <=4 SENSITIVE Sensitive     * >=100,000 COLONIES/mL ESCHERICHIA COLI  Culture, blood (routine x 2)     Status: None (Preliminary result)   Collection Time: 08/27/15  3:20 PM  Result Value Ref Range Status   Specimen Description BLOOD BLOOD LEFT HAND  Final   Special Requests BOTTLES  DRAWN AEROBIC AND ANAEROBIC 4CC  Final   Culture PENDING  Incomplete   Report Status PENDING  Incomplete  Culture, blood (routine x 2)     Status: None (Preliminary result)   Collection Time: 08/27/15  3:38 PM  Result Value Ref Range Status   Specimen Description BLOOD BLOOD RIGHT HAND  Final   Special Requests BOTTLES DRAWN AEROBIC AND ANAEROBIC 4CC  Final   Culture PENDING  Incomplete   Report Status PENDING  Incomplete     Studies: Dg Chest 2 View  08/27/2015  CLINICAL DATA:  Fever.  Chest pain.  Sickle cell crisis. EXAM: CHEST  2 VIEW COMPARISON:  08/25/2015 chest radiograph. FINDINGS: Stable cardiomediastinal silhouette with top-normal heart size. No pneumothorax. New small left pleural effusion. No pulmonary edema. Mild curvilinear opacities at both lung bases. IMPRESSION: 1. New small left pleural effusion. 2. Mild curvilinear opacities at both lung bases, favor atelectasis. Electronically Signed   By: Ilona Sorrel M.D.   On: 08/27/2015 15:05    Scheduled Meds: . sodium chloride   Intravenous Once  . ciprofloxacin  500 mg Oral BID  . folic acid  1 mg Oral Daily  . HYDROmorphone   Intravenous 6 times per day  . hydroxyurea  1,000 mg Oral Daily  . ketorolac  30 mg Intravenous 4 times per day  . senna-docusate  1 tablet Oral BID   Continuous Infusions:   Assessment and plan:  Principal Problem:   Sickle cell crisis (HCC) Active Problems:   Sickle cell anemia (HCC)   UTI (urinary tract infection)   1. Sickle cell crisis in the setting of UTI. The patient was started on a Dilaudid PCA pump and vigorous IV fluids. Hydroxyurea and folic acid were continued. Toradol was added schedule. The patient's pain was not adequately managed, so with the help of her PCP Dr. Liston Alba, custom Dilaudid PCA regimen was ordered. Patient says that it has helped.  -On 08/28/2015 pressure reporting an improvement to pain symptoms. We will de-escalate IV narcotics by decreasing her  Dilaudid  dose from 2 mg IV every 3 hours as needed to 1 mg IV every 3 hours as needed. Increase  oxycodone 10 mg every 4 hours as needed for breakthrough pain.  Sickle cell anemia with macrocytosis.  The patient's hemoglobin was 7.7 on admission. It has fallen to 5.5. We will transfuse 2 units of packed red blood cells-08/28/2015:   Escherichia coli urinary tract infection. Patient urinalysis on admission was consistent with UTI. Now on ciprofloxacin, will continue 7 days   Febrile Illness. Likely secondary to UTI. She denies diarrhea or purulent cough.  -Chest x-ray showed findings consistent with atelectasis.     Elevated  LFTs and hyperbilirubinemia. Etiology from sickling RBCs. Will continue to follow.    Time spent: 30 minutes    [place] [facility] Pager [phone]. If 7PM-7AM, please contact night-coverage at [email or web address], password [email or web address] 08/29/2015, 9:15 AM  LOS: 4 days

## 2015-08-29 NOTE — Care Management Note (Signed)
Case Management Note  Patient Details  Name: Dawn [name] MRN: [id number] Date of Birth: [birth year]  Subjective/Objective:                    Action/Plan:   Expected Discharge Date:  08/27/15               Expected Discharge Plan:  Home/Self Care  In-House Referral:  NA  Discharge planning Services  CM Consult  Post Acute Care Choice:  NA Choice offered to:  NA  DME Arranged:    DME Agency:     HH Arranged:    [place] Agency:     Status of Service:  Completed, signed off  Medicare Important Message Given:  Yes Date Medicare IM Given:    Medicare IM give by:    Date Additional Medicare IM Given:    Additional Medicare Important Message give by:     If discussed at [place] of Stay Meetings, dates discussed:    Additional Comments: Pt still on PCA. Pain is better controlled.  Christinia Gully [place], RN 08/29/2015, 3:19 PM

## 2015-08-30 LAB — CBC
HCT: 21.8 % — ABNORMAL LOW (ref 36.0–46.0)
HEMOGLOBIN: 7.6 g/dL — AB (ref 12.0–15.0)
MCH: 34.1 pg — AB (ref 26.0–34.0)
MCHC: 34.9 g/dL (ref 30.0–36.0)
MCV: 97.8 fL (ref 78.0–100.0)
PLATELETS: 177 10*3/uL (ref 150–400)
RBC: 2.23 MIL/uL — ABNORMAL LOW (ref 3.87–5.11)
RDW: 23.6 % — ABNORMAL HIGH (ref 11.5–15.5)
WBC: 11.4 10*3/uL — ABNORMAL HIGH (ref 4.0–10.5)

## 2015-08-30 LAB — COMPREHENSIVE METABOLIC PANEL
ALK PHOS: 121 U/L (ref 38–126)
ALT: 28 U/L (ref 14–54)
ANION GAP: 4 — AB (ref 5–15)
AST: 75 U/L — ABNORMAL HIGH (ref 15–41)
Albumin: 2.7 g/dL — ABNORMAL LOW (ref 3.5–5.0)
BUN: 8 mg/dL (ref 6–20)
CALCIUM: 8.1 mg/dL — AB (ref 8.9–10.3)
CO2: 27 mmol/L (ref 22–32)
CREATININE: 0.44 mg/dL (ref 0.44–1.00)
Chloride: 107 mmol/L (ref 101–111)
Glucose, Bld: 96 mg/dL (ref 65–99)
Potassium: 3.5 mmol/L (ref 3.5–5.1)
SODIUM: 138 mmol/L (ref 135–145)
Total Bilirubin: 8.9 mg/dL — ABNORMAL HIGH (ref 0.3–1.2)
Total Protein: 7.1 g/dL (ref 6.5–8.1)

## 2015-08-30 LAB — RETICULOCYTES: RBC.: 2.23 MIL/uL — AB (ref 3.87–5.11)

## 2015-08-30 MED ORDER — HYDROXYUREA 500 MG PO CAPS
1000.0000 mg | ORAL_CAPSULE | Freq: Every day | ORAL | Status: DC
  Administered 2015-08-30 – 2015-08-31 (×2): 1000 mg via ORAL
  Filled 2015-08-30: qty 2

## 2015-08-30 MED ORDER — OXYCODONE HCL ER 20 MG PO T12A
20.0000 mg | EXTENDED_RELEASE_TABLET | Freq: Two times a day (BID) | ORAL | Status: DC
  Administered 2015-08-30 – 2015-08-31 (×2): 20 mg via ORAL
  Filled 2015-08-30 (×2): qty 1

## 2015-08-30 MED ORDER — OXYCODONE HCL 5 MG PO TABS
5.0000 mg | ORAL_TABLET | Freq: Once | ORAL | Status: AC
  Administered 2015-08-30: 5 mg via ORAL
  Filled 2015-08-30: qty 1

## 2015-08-30 MED ORDER — OXYCODONE HCL ER 20 MG PO T12A
20.0000 mg | EXTENDED_RELEASE_TABLET | Freq: Two times a day (BID) | ORAL | Status: DC
Start: 2015-08-30 — End: 2015-08-30

## 2015-08-30 NOTE — Progress Notes (Addendum)
[facility] PROGRESS NOTE  Dawn [name] [id number] DOB: 1981/06/09 DOA: 08/25/2015 PCP: [name],[name], MD    Code Status: Full code Family Communication: Discussed with patient; family not available Disposition Plan: Discharge in a.m.   Consultants:  Curbside consult with patient's PCP, Dr. Zigmund Daniel  Procedures:  None  Antibiotics:  Cipro 08/27/15>>  Vancomycin 08/27/15>>  Duricef>> 08/27/15  HPI/Subjective: Patient feels better than yesterday however still complaining of chest pain Received only one unit of packed red blood cells which was ordered   Objective: Filed Vitals:   08/29/15 2230 08/30/15 0450  BP: 134/73 115/61  Pulse: 100 92  Temp: 100.6 F (38.1 C) 97.8 F (36.6 C)  Resp: 20 20    Intake/Output Summary (Last 24 hours) at 08/30/15 1148 Last data filed at 08/30/15 0959  Gross per 24 hour  Intake    575 ml  Output      0 ml  Net    575 ml   Filed Weights   08/25/15 1119  Weight: 72.576 kg (160 lb)    Exam:   General:  [name] in no acute distress.  Cardiovascular: S1, S2, with soft systolic murmur.  Respiratory: Clear to auscultation bilaterally with no wheezes or crackles or rhonchi.  Abdomen: Positive bowel sounds, soft, nontender, nondistended.  Musculoskeletal/extremities: Mild tenderness over the chest wall muscles on the right with no erythema or edema; mild tenderness over the proximal and distal legs without any acute hot red joints.   Data Reviewed: Basic Metabolic Panel:  Recent Labs Lab 08/25/15 1204 08/26/15 0709 08/28/15 0634 08/29/15 0802 08/30/15 0640  NA 137 138 139 139 138  K 4.1 3.9 3.6 3.8 3.5  CL 104 109 109 109 107  CO2 24 24 25 25 27   GLUCOSE 87 104* 122* 100* 96  BUN 7 10 7 8 8   CREATININE 0.42* 0.48 <0.30* 0.32* 0.44  CALCIUM 8.8* 8.3* 8.1* 7.8* 8.1*   Liver Function Tests:  Recent Labs Lab 08/25/15 1204 08/26/15 0709 08/28/15 0634 08/30/15 0640   AST 91* 63* 65* 75*  ALT 34 26 25 28   ALKPHOS 143* 123 114 121  BILITOT 10.1* 8.6* 9.2* 8.9*  PROT 8.0 7.0 6.4* 7.1  ALBUMIN 3.2* 2.7* 2.5* 2.7*   No results for input(s): LIPASE, AMYLASE in the last 168 hours. No results for input(s): AMMONIA in the last 168 hours. CBC:  Recent Labs Lab 08/25/15 1204 08/26/15 0709 08/27/15 0643 08/28/15 0634 08/29/15 0802 08/30/15 0640  WBC 9.1 8.9 11.3* 10.1 11.0* 11.4*  NEUTROABS 4.7  --   --   --   --   --   HGB 7.7* 6.8* 6.8* 6.2* 5.5* 7.6*  HCT 21.3* 18.8* 18.8* 17.0* 15.5* 21.8*  MCV 102.9* 103.3* 104.4* 104.3* 104.7* 97.8  PLT 187 162 176 157 146* 177   Cardiac Enzymes:  Recent Labs Lab 08/25/15 1204  TROPONINI <0.03   BNP (last 3 results) No results for input(s): BNP in the last 8760 hours.  ProBNP (last 3 results) No results for input(s): PROBNP in the last 8760 hours.  CBG: No results for input(s): GLUCAP in the last 168 hours.  Recent Results (from the past 240 hour(s))  Culture, Urine     Status: None   Collection Time: 08/25/15 12:01 PM  Result Value Ref Range Status   Specimen Description URINE, RANDOM  Final   Special Requests NONE  Final   Culture   Final    >=100,000 COLONIES/mL ESCHERICHIA COLI Performed at  [facility]    Report Status 08/28/2015 FINAL  Final   Organism ID, Bacteria ESCHERICHIA COLI  Final      Susceptibility   Escherichia coli - MIC*    AMPICILLIN 4 SENSITIVE Sensitive     CEFAZOLIN <=4 SENSITIVE Sensitive     CEFTRIAXONE <=1 SENSITIVE Sensitive     CIPROFLOXACIN <=0.25 SENSITIVE Sensitive     GENTAMICIN <=1 SENSITIVE Sensitive     IMIPENEM <=0.25 SENSITIVE Sensitive     NITROFURANTOIN <=16 SENSITIVE Sensitive     TRIMETH/SULFA <=20 SENSITIVE Sensitive     AMPICILLIN/SULBACTAM 4 SENSITIVE Sensitive     PIP/TAZO <=4 SENSITIVE Sensitive     * >=100,000 COLONIES/mL ESCHERICHIA COLI  Culture, blood (routine x 2)     Status: None (Preliminary result)   Collection Time:  08/27/15  3:20 PM  Result Value Ref Range Status   Specimen Description BLOOD BLOOD LEFT HAND  Final   Special Requests BOTTLES DRAWN AEROBIC AND ANAEROBIC 4CC  Final   Culture NO GROWTH 2 DAYS  Final   Report Status PENDING  Incomplete  Culture, blood (routine x 2)     Status: None (Preliminary result)   Collection Time: 08/27/15  3:38 PM  Result Value Ref Range Status   Specimen Description BLOOD BLOOD RIGHT HAND  Final   Special Requests BOTTLES DRAWN AEROBIC AND ANAEROBIC 4CC  Final   Culture NO GROWTH 2 DAYS  Final   Report Status PENDING  Incomplete     Studies: No results found.  Scheduled Meds: . ciprofloxacin  500 mg Oral BID  . folic acid  1 mg Oral Daily  . hydroxyurea  1,000 mg Oral Daily  . ketorolac  30 mg Intravenous 4 times per day  . oxyCODONE  15 mg Oral Q12H  . senna-docusate  1 tablet Oral BID   Continuous Infusions: . sodium chloride 75 mL/hr at 08/30/15 0327   Assessment and plan:  Principal Problem:   Sickle cell crisis [facility]) Active Problems:   Sickle cell anemia (HCC)   UTI (urinary tract infection)   1. Sickle cell crisis in the setting of UTI. The patient was started on a Dilaudid PCA pump and vigorous IV fluids. Hydroxyurea and folic acid were continued. Toradol was added schedule. The patient's pain was not adequately managed, so with the help of her PCP Dr. Liston Alba, custom Dilaudid PCA regimen was ordered. Currently off Dilaudid PCA since 12/5. Now on oxycodone, OxyContin, increase OxyContin to 20 mg twice a day, continue Toradol  Patient also receiving Dilaudid 1- 2 mg IV every 3 hours    Sickle cell anemia with macrocytosis.  The patient's hemoglobin was 7.7 on admission. It has fallen to 5.5. On 12/5.  Patient noted to have 2 units of packed red blood cells, has only received 1 unit so far Repeat CBC tomorrow, continues to have reticulocyte count >23.0 Hemoglobin 7.6 today  Escherichia coli urinary tract infection. Patient  urinalysis on admission was consistent with UTI. Now on ciprofloxacin, will continue 7 days   Febrile Illness. Likely secondary to UTI. She denies diarrhea or purulent cough.  -Chest x-ray showed findings consistent with atelectasis.     Elevated LFTs and hyperbilirubinemia. Etiology from sickling RBCs. Will continue to follow.    Time spent: 30 minutes    [place] [facility] Pager [phone]. If 7PM-7AM, please contact night-coverage at [email or web address], password [facility] 08/30/2015, 11:48 AM  LOS: 5 days

## 2015-08-30 NOTE — Care Management Note (Signed)
Case Management Note  Patient Details  Name: Dawn [name] MRN: [id number] Date of Birth: 1981/05/24  Subjective/Objective:                    Action/Plan:   Expected Discharge Date:  08/27/15               Expected Discharge Plan:  Home/Self Care  In-House Referral:  NA  Discharge planning Services  CM Consult  Post Acute Care Choice:  NA Choice offered to:  NA  DME Arranged:    DME Agency:     HH Arranged:    [name] Agency:     Status of Service:  Completed, signed off  Medicare Important Message Given:  Yes Date Medicare IM Given:    Medicare IM give by:    Date Additional Medicare IM Given:    Additional Medicare Important Message give by:     If discussed at [place] of Stay Meetings, dates discussed:  08/30/15  Additional Comments:  Joylene Draft, RN 08/30/2015, 3:49 PM

## 2015-08-30 NOTE — Progress Notes (Signed)
Unit 1 of 2 PRBC's completed without problems, per lab Cyril Mourning) states unit # 2 not ready and they will call when blood arrives in building, [place]. Patient made aware.

## 2015-08-31 DIAGNOSIS — D571 Sickle-cell disease without crisis: Secondary | ICD-10-CM

## 2015-08-31 LAB — CBC
HCT: 23.8 % — ABNORMAL LOW (ref 36.0–46.0)
Hemoglobin: 8.2 g/dL — ABNORMAL LOW (ref 12.0–15.0)
MCH: 31.4 pg (ref 26.0–34.0)
MCHC: 34.5 g/dL (ref 30.0–36.0)
MCV: 91.2 fL (ref 78.0–100.0)
PLATELETS: 153 10*3/uL (ref 150–400)
RBC: 2.61 MIL/uL — AB (ref 3.87–5.11)
RDW: 27.9 % — ABNORMAL HIGH (ref 11.5–15.5)
WBC: 12.6 10*3/uL — AB (ref 4.0–10.5)

## 2015-08-31 LAB — TYPE AND SCREEN
ABO/RH(D): A POS
Antibody Screen: POSITIVE
DAT, IGG: NEGATIVE
UNIT DIVISION: 0
Unit division: 0

## 2015-08-31 LAB — MAGNESIUM: Magnesium: 1.8 mg/dL (ref 1.7–2.4)

## 2015-08-31 LAB — COMPREHENSIVE METABOLIC PANEL
ALT: 28 U/L (ref 14–54)
AST: 84 U/L — ABNORMAL HIGH (ref 15–41)
Albumin: 2.5 g/dL — ABNORMAL LOW (ref 3.5–5.0)
Alkaline Phosphatase: 130 U/L — ABNORMAL HIGH (ref 38–126)
Anion gap: 4 — ABNORMAL LOW (ref 5–15)
BUN: 8 mg/dL (ref 6–20)
CHLORIDE: 106 mmol/L (ref 101–111)
CO2: 26 mmol/L (ref 22–32)
CREATININE: 0.36 mg/dL — AB (ref 0.44–1.00)
Calcium: 8 mg/dL — ABNORMAL LOW (ref 8.9–10.3)
GFR calc non Af Amer: >60 mL/min (ref >60)
Glucose, Bld: 72 mg/dL (ref 65–99)
Potassium: 4.2 mmol/L (ref 3.5–5.1)
SODIUM: 136 mmol/L (ref 135–145)
Total Bilirubin: 7.6 mg/dL — ABNORMAL HIGH (ref 0.3–1.2)
Total Protein: 6.5 g/dL (ref 6.5–8.1)

## 2015-08-31 MED ORDER — CIPROFLOXACIN HCL 500 MG PO TABS: 500.0000 mg | ORAL_TABLET | Freq: Two times a day (BID) | ORAL | Status: DC

## 2015-08-31 MED ORDER — OXYCODONE HCL ER 20 MG PO T12A: 20.0000 mg | EXTENDED_RELEASE_TABLET | Freq: Two times a day (BID) | ORAL | Status: DC

## 2015-08-31 MED ORDER — OXYMETAZOLINE HCL 0.05 % NA SOLN: 1.0000 | Freq: Two times a day (BID) | NASAL | Status: DC | PRN

## 2015-08-31 MED ORDER — PROMETHAZINE HCL 12.5 MG PO TABS: 12.5000 mg | ORAL_TABLET | Freq: Four times a day (QID) | ORAL | Status: DC | PRN

## 2015-08-31 MED ORDER — OXYMETAZOLINE HCL 0.05 % NA SOLN
1.0000 | Freq: Two times a day (BID) | NASAL | Status: DC
  Administered 2015-08-31: 1 via NASAL
  Filled 2015-08-31: qty 15

## 2015-08-31 MED ORDER — SENNOSIDES-DOCUSATE SODIUM 8.6-50 MG PO TABS: 1.0000 | ORAL_TABLET | Freq: Two times a day (BID) | ORAL | Status: DC

## 2015-08-31 MED ORDER — POLYETHYLENE GLYCOL 3350 17 G PO PACK: 17.0000 g | PACK | Freq: Every day | ORAL | Status: DC | PRN

## 2015-08-31 NOTE — Care Management Note (Signed)
Case Management Note  Patient Details  Name: EULINE MCCORY MRN: [id number] Date of Birth: 1981/05/28  Subjective/Objective:                    Action/Plan:   Expected Discharge Date:  08/27/15               Expected Discharge Plan:  Home/Self Care  In-House Referral:  NA  Discharge planning Services  CM Consult  Post Acute Care Choice:  NA Choice offered to:  NA  DME Arranged:    DME Agency:     HH Arranged:    [place] Agency:     Status of Service:  Completed, signed off  Medicare Important Message Given:  Yes Date Medicare IM Given:    Medicare IM give by:    Date Additional Medicare IM Given:    Additional Medicare Important Message give by:     If discussed at [place] of Stay Meetings, dates discussed:    Additional Comments: Pt discharged home today. No CM needs noted. Christinia Gully [place], RN 08/31/2015, 1:25 PM

## 2015-08-31 NOTE — Care Management Important Message (Signed)
Important Message  Patient Details  Name: Dawn [name] MRN: [id number] Date of Birth: [birth year]   Medicare Important Message Given:  Yes    Joylene Draft, RN 08/31/2015, 1:25 PM

## 2015-08-31 NOTE — Discharge Summary (Signed)
Physician Discharge Summary  Dawn [name] [id number] DOB: 21-Apr-1981 DOA: 08/25/2015  PCP: [name],[name], MD  Admit date: 08/25/2015 Discharge date: 08/31/2015  Time spent: Greater than 30 minutes  Recommendations for Outpatient Follow-up:  1. Recommend follow-up of the patient's CBC and LFTs.  Discharge Diagnoses:  1. Sickle cell disease with vaso-occlusive crisis. 2. 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 Status post transfusion of 2 units of packed red blood cells. 3. Escherichia coli urinary tract infection. 4. Chronic intermittent epistaxis. 5. Hyperbilirubinemia secondary to sickling RBCs.   Discharge Condition: Improved.  Diet recommendation: As tolerated.  Filed Weights   08/25/15 1119  Weight: 72.576 kg (160 lb)    History of present illness:  The patient is a 34 year old woman with sickle cell disease and sickle cell anemia, who presented to the ED on 08/25/2015 with a chief complaint of diffuse pain, particularly left substernal chest pain which is indicative of her sickle cell crisis. In the ED, she was afebrile and hemodynamically stable. She was oxygenating between 89 and 93% on room air. Her lab data were significant for hemoglobin of 7.7, total bilirubin of 10.1, AST of 91, ALT of 34, and a urinalysis that was positive for nitrite, many bacteria, and a trace of leukocytes. She was admitted for further evaluation and management.  Hospital Course:  1. Sickle cell crisis in the setting of UTI. The patient was started on a Dilaudid PCA pump and vigorous IV fluids. Hydroxyurea and folic acid were continued. Toradol was added scheduled. The patient's pain was not adequately managed, so with the help of her PCP Dr. Liston Alba, custom Dilaudid PCA regimen was ordered.  -The patient's pain subsided. The Dilaudid PCA was discontinued. She was restarted on as needed oxycodone. OxyContin at 20 mg twice a day was added.   -During the 24 hours that the PCA was off,  the patient's pain level remained in the 5/10 range. -She was discharged with a prescription for OxyContin 20 mg every 12 hours #60 with no refills and a prescription for Phenergan. -She was instructed not to return to work until she has been evaluated by her PCP in the outpatient setting in the next 3-7 days. She voiced understanding.  Sickle cell anemia with macrocytosis.  The patient's hemoglobin was 7.7 on admission. It fell to a nadir of 5.5. She was transfused 2 units of packed red blood cells.Her hemoglobin improved and stabilized at 8.2 prior to discharge.  Escherichia coli urinary tract infection. Patient urinalysis on admission was consistent with UTI. She was started on Duricef. However, when she developed a fever, it was discontinued in favor of IV Cipro and vancomycin. The urine culture grew out Escherichia coli, sensitive to Cipro. She was discharged on several more days of Cipro for total of 7 days of treatment.  Febrile Illness. Likely secondary to UTI. She denied diarrhea or purulent cough.  -Chest x-ray showed findings consistent with atelectasis. Blood cultures were ordered and were negative to date.  Elevated LFTs and hyperbilirubinemia. Patient was noted to have hyperbilirubinemia and mildly elevated LFTs. She was also noted to have scleral icterus. Etiology was from sickling RBCs. Her LFTs remained more or less stable and her total bilirubin decreased to 7.6 prior to discharge.   Procedures:  Packed red blood transfusion 2.  Consultations:  Curbside consult with patient's PCP, Dr. Zigmund Daniel.  Discharge Exam: Filed Vitals:   08/31/15 0528 08/31/15 1451  BP: 140/76 130/71  Pulse: 90 99  Temp: 98.4 F (36.9 C)  Resp: 20 18  Oxygen saturation 100% on room air   General: [name] in no acute distress. Cardiovascular: S1, S2, with a soft systolic murmur.  Respiratory: clear to auscultation bilaterally. Extremities/musculoskeletal:  No acute hot red joints or edema in her lower extremities.  Discharge Instructions   Discharge Instructions    Diet - low sodium heart healthy    Complete by:  As directed      Increase activity slowly    Complete by:  As directed           Current Discharge Medication List    START taking these medications   Details  ciprofloxacin (CIPRO) 500 MG tablet Take 1 tablet (500 mg total) by mouth 2 (two) times daily. ANTIBIOTIC TO BE TAKEN UNTIL COMPLETION. Qty: 6 tablet, Refills: 0    oxyCODONE (OXYCONTIN) 20 mg 12 hr tablet Take 1 tablet (20 mg total) by mouth every 12 (twelve) hours. Qty: 60 tablet, Refills: 0    oxymetazoline (AFRIN) 0.05 % nasal spray Place 1 spray into both nostrils 2 (two) times daily as needed for congestion. FOR NOSEBLEEDS OR NASAL CONGESTION. Refills: 0    polyethylene glycol (MIRALAX / GLYCOLAX) packet Take 17 g by mouth daily as needed for mild constipation.    senna-docusate (SENOKOT-S) 8.6-50 MG tablet Take 1 tablet by mouth 2 (two) times daily.      CONTINUE these medications which have CHANGED   Details  promethazine (PHENERGAN) 12.5 MG tablet Take 1 tablet (12.5 mg total) by mouth every 6 (six) hours as needed for nausea. Qty: 20 tablet, Refills: 0   Associated Diagnoses: Hb-SS disease without crisis ([place])      CONTINUE these medications which have NOT CHANGED   Details  cetirizine (ZYRTEC) 10 MG tablet TAKE 1 TABLET (10 MG TOTAL) BY MOUTH DAILY. Refills: 3    folic acid (FOLVITE) 1 MG tablet Take 1 tablet (1 mg total) by mouth daily. 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 May take with food to minimize GI side effects. Qty: 60 capsule, Refills: 11   Associated Diagnoses: Hb-SS disease without crisis ([facility])    ibuprofen (ADVIL,MOTRIN) 200 MG tablet Take 400 mg by mouth every 6 (six) hours as needed.     Oxycodone HCl 10 MG TABS Take 1 tablet (10 mg total) by mouth every 4 (four) hours as needed. Qty: 60 tablet, Refills: 0   Associated Diagnoses: Sickle cell anemia with crisis ([facility])    Vitamin D, Ergocalciferol, (DRISDOL) 50000 UNITS CAPS capsule Take 1 capsule (50,000 Units total) by mouth every 7 (seven) days. Takes on Sundays. Qty: 4 capsule, Refills: 11   Associated Diagnoses: Vitamin D deficiency       Allergies  Allergen Reactions  . Desferal [Deferoxamine] Hives    Local reaction on arm only during infusion. Can take with benadryl   . Latex Other (See Comments)    REACTION: Pt experiences a burning sensation on contacted skin areas  . Lisinopril Other (See Comments) and Cough    REACTION: Sore/scratchy throat  . Tape Other (See Comments)    REACTION: Pt. Experiences a burning sensation on contacted skin areas   Follow-up Information    Schedule an appointment as soon as possible for a visit with [name],[name], MD.   Specialty:  Internal Medicine   Why:  TO BE SEEN IN  2-7 DAYS.   Contact information:   [address] [place] [id number] [phone]        The results of significant diagnostics from this hospitalization (including imaging, microbiology, ancillary and laboratory) are listed below for reference.    Significant Diagnostic Studies: Dg Chest 2 View  08/27/2015  CLINICAL DATA:  Fever.  Chest pain.  Sickle cell crisis. EXAM: CHEST  2 VIEW COMPARISON:  08/25/2015 chest radiograph. FINDINGS: Stable cardiomediastinal silhouette with top-normal heart size. No pneumothorax. New small left pleural effusion. No pulmonary edema. Mild curvilinear opacities at both lung bases. IMPRESSION: 1. New small left pleural effusion. 2. Mild curvilinear opacities at both lung bases, favor atelectasis. Electronically Signed   By: Ilona Sorrel M.D.   On: 08/27/2015 15:05   Dg Chest 2 View  08/25/2015  CLINICAL DATA:  Chest pain and short of breath EXAM: CHEST  2  VIEW COMPARISON:  08/05/2015 FINDINGS: Normal heart size. Clear lungs. No pneumothorax. No pleural effusion. IMPRESSION: No active cardiopulmonary disease. Electronically Signed   By: Marybelle Killings M.D.   On: 08/25/2015 12:40   Dg Chest 2 View  08/05/2015  CLINICAL DATA:  Central chest pain 2 days.  Sickle cell crisis. EXAM: CHEST  2 VIEW COMPARISON:  07/06/2015, 07/02/2015 and 06/27/2015 FINDINGS: Lungs are adequately inflated with resolution of the previous seen bibasilar opacification and effusions. Cardiomediastinal silhouette is within normal. Stable chronic changes of the left humeral head. IMPRESSION: No active cardiopulmonary disease. Electronically Signed   By: Marin Olp M.D.   On: 08/05/2015 17:54   Ct Angio Chest Pe W/cm &/or Wo Cm  08/25/2015  CLINICAL DATA:  Chest pain since last night. States it feels like her sickle cell pain. Pulse ox 88% on room air at work. EXAM: CT ANGIOGRAPHY CHEST WITH CONTRAST TECHNIQUE: Multidetector CT imaging of the chest was performed using the standard protocol during bolus administration of intravenous contrast. Multiplanar CT image reconstructions and MIPs were obtained to evaluate the vascular anatomy. CONTRAST:  134mL OMNIPAQUE IOHEXOL 350 MG/ML SOLN COMPARISON:  Current chest radiograph FINDINGS: Angiographic study: No evidence of a pulmonary embolus. Great vessels are normal in caliber. No atherosclerotic plaque. Neck base and axilla:  No mass or adenopathy.  Normal thyroid. Mediastinum and hila: Heart normal in size and configuration. No masses or adenopathy. Lungs and pleura: Minimal dependent subsegmental atelectasis in the lung bases and lateral right upper lobe. The lungs are otherwise clear. No pleural effusion or pneumothorax. Limited upper abdomen: Morphologic changes of the liver are consistent with cirrhosis with surface nodularity, central volume loss and relative enlargement of the caudate lobe and lateral segment of the left lobe. No liver mass  or focal lesion. Gallbladder surgically absent. Musculoskeletal: Irregular sclerosis is noted mostly along the mid to lower thoracic spine. Findings consistent with patient's reported history of sickle cell disease. No osteolytic lesions. Review of the MIP images confirms the above findings. IMPRESSION: 1. No evidence of a pulmonary embolus. 2. No acute findings. Electronically Signed   By: Lajean Manes M.D.   On: 08/25/2015 14:04    Microbiology: Recent Results (from the past 240 hour(s))  Culture, Urine     Status: None   Collection Time: 08/25/15 12:01 PM  Result Value Ref Range Status   Specimen Description URINE, RANDOM  Final   Special Requests NONE  Final   Culture   Final    >=100,000 COLONIES/mL ESCHERICHIA COLI Performed at [facility]    Report  Status 08/28/2015 FINAL  Final   Organism ID, Bacteria ESCHERICHIA COLI  Final      Susceptibility   Escherichia coli - MIC*    AMPICILLIN 4 SENSITIVE Sensitive     CEFAZOLIN <=4 SENSITIVE Sensitive     CEFTRIAXONE <=1 SENSITIVE Sensitive     CIPROFLOXACIN <=0.25 SENSITIVE Sensitive     GENTAMICIN <=1 SENSITIVE Sensitive     IMIPENEM <=0.25 SENSITIVE Sensitive     NITROFURANTOIN <=16 SENSITIVE Sensitive     TRIMETH/SULFA <=20 SENSITIVE Sensitive     AMPICILLIN/SULBACTAM 4 SENSITIVE Sensitive     PIP/TAZO <=4 SENSITIVE Sensitive     * >=100,000 COLONIES/mL ESCHERICHIA COLI  Culture, blood (routine x 2)     Status: None (Preliminary result)   Collection Time: 08/27/15  3:20 PM  Result Value Ref Range Status   Specimen Description BLOOD LEFT HAND  Final   Special Requests BOTTLES DRAWN AEROBIC AND ANAEROBIC 4CC  Final   Culture NO GROWTH 4 DAYS  Final   Report Status PENDING  Incomplete  Culture, blood (routine x 2)     Status: None (Preliminary result)   Collection Time: 08/27/15  3:38 PM  Result Value Ref Range Status   Specimen Description BLOOD RIGHT HAND  Final   Special Requests BOTTLES DRAWN AEROBIC AND ANAEROBIC  4CC  Final   Culture NO GROWTH 4 DAYS  Final   Report Status PENDING  Incomplete     Labs: Basic Metabolic Panel:  Recent Labs Lab 08/26/15 0709 08/28/15 0634 08/29/15 0802 08/30/15 0640 08/31/15 0632  NA 138 139 139 138 136  K 3.9 3.6 3.8 3.5 4.2  CL 109 109 109 107 106  CO2 24 25 25 27 26   GLUCOSE 104* 122* 100* 96 72  BUN 10 7 8 8 8   CREATININE 0.48 <0.30* 0.32* 0.44 0.36*  CALCIUM 8.3* 8.1* 7.8* 8.1* 8.0*  MG  --   --   --   --  1.8   Liver Function Tests:  Recent Labs Lab 08/25/15 1204 08/26/15 0709 08/28/15 0634 08/30/15 0640 08/31/15 0632  AST 91* 63* 65* 75* 84*  ALT 34 26 25 28 28   ALKPHOS 143* 123 114 121 130*  BILITOT 10.1* 8.6* 9.2* 8.9* 7.6*  PROT 8.0 7.0 6.4* 7.1 6.5  ALBUMIN 3.2* 2.7* 2.5* 2.7* 2.5*   No results for input(s): LIPASE, AMYLASE in the last 168 hours. No results for input(s): AMMONIA in the last 168 hours. CBC:  Recent Labs Lab 08/25/15 1204  08/27/15 0643 08/28/15 0634 08/29/15 0802 08/30/15 0640 08/31/15 0632  WBC 9.1  < > 11.3* 10.1 11.0* 11.4* 12.6*  NEUTROABS 4.7  --   --   --   --   --   --   HGB 7.7*  < > 6.8* 6.2* 5.5* 7.6* 8.2*  HCT 21.3*  < > 18.8* 17.0* 15.5* 21.8* 23.8*  MCV 102.9*  < > 104.4* 104.3* 104.7* 97.8 91.2  PLT 187  < > 176 157 146* 177 153  < > = values in this interval not displayed. Cardiac Enzymes:  Recent Labs Lab 08/25/15 1204  TROPONINI <0.03   BNP: BNP (last 3 results) No results for input(s): BNP in the last 8760 hours.  ProBNP (last 3 results) No results for input(s): PROBNP in the last 8760 hours.  CBG: No results for input(s): GLUCAP in the last 168 hours.     Signed:  Shilah Hefel  [facility] 08/31/2015, 2:57 PM

## 2015-08-31 NOTE — Progress Notes (Signed)
Discharge instructions covered with patient.  She agrees to make a f/u appt with her PCP at the [facility] tomorrow.  She was given a note for work and RX's.  She requested that she be allowed to drive herself home in her own car which is in the ED parking lot.  It was explained to her that it would be safer to call a friend/family member for a ride home and maybe return another day to retrieve her car due to the fact of the recent administration of narcotics.

## 2015-09-01 LAB — CULTURE, BLOOD (ROUTINE X 2)
CULTURE: NO GROWTH
CULTURE: NO GROWTH

## 2015-09-05 ENCOUNTER — Ambulatory Visit (INDEPENDENT_AMBULATORY_CARE_PROVIDER_SITE_OTHER): Payer: BLUE CROSS/BLUE SHIELD | Admitting: Family Medicine

## 2015-09-05 ENCOUNTER — Encounter: Payer: Self-pay | Admitting: Family Medicine

## 2015-09-05 VITALS — BP 127/71 | HR 84 | Temp 98.4°F | Resp 16 | Ht 63.0 in | Wt 160.0 lb

## 2015-09-05 DIAGNOSIS — D571 Sickle-cell disease without crisis: Secondary | ICD-10-CM | POA: Diagnosis not present

## 2015-09-05 LAB — COMPLETE METABOLIC PANEL WITH GFR
ALT: 25 U/L (ref 6–29)
AST: 62 U/L — AB (ref 10–30)
Albumin: 2.9 g/dL — ABNORMAL LOW (ref 3.6–5.1)
Alkaline Phosphatase: 139 U/L — ABNORMAL HIGH (ref 33–115)
BILIRUBIN TOTAL: 5.7 mg/dL — AB (ref 0.2–1.2)
BUN: 5 mg/dL — AB (ref 7–25)
CHLORIDE: 105 mmol/L (ref 98–110)
CO2: 23 mmol/L (ref 20–31)
CREATININE: 0.44 mg/dL — AB (ref 0.50–1.10)
Calcium: 8.3 mg/dL — ABNORMAL LOW (ref 8.6–10.2)
GFR, Est African American: >89 mL/min (ref >=60)
GFR, Est Non African American: >89 mL/min (ref >=60)
GLUCOSE: 86 mg/dL (ref 65–99)
Potassium: 4 mmol/L (ref 3.5–5.3)
Sodium: 136 mmol/L (ref 135–146)
TOTAL PROTEIN: 6.9 g/dL (ref 6.1–8.1)

## 2015-09-05 LAB — POCT URINALYSIS DIP (DEVICE)
GLUCOSE, UA: NEGATIVE mg/dL
Ketones, ur: NEGATIVE mg/dL
LEUKOCYTES UA: NEGATIVE
NITRITE: NEGATIVE
PROTEIN: 30 mg/dL — AB
Specific Gravity, Urine: 1.015 (ref 1.005–1.030)
UROBILINOGEN UA: 1 mg/dL (ref 0.0–1.0)
pH: 7 (ref 5.0–8.0)

## 2015-09-05 MED ORDER — OXYCODONE HCL 10 MG PO TABS: 10.0000 mg | ORAL_TABLET | Freq: Four times a day (QID) | ORAL | Status: DC | PRN

## 2015-09-05 MED ORDER — HYDROXYUREA 500 MG PO CAPS: 1000.0000 mg | ORAL_CAPSULE | Freq: Every day | ORAL | Status: DC

## 2015-09-05 MED ORDER — OXYCODONE HCL ER 10 MG PO T12A: 10.0000 mg | EXTENDED_RELEASE_TABLET | Freq: Two times a day (BID) | ORAL | Status: DC

## 2015-09-05 NOTE — Progress Notes (Signed)
Subjective:    Patient ID: Dawn [name], female    DOB: [birth year], 34 y.o.   MRN: [id number]  HPI Ms. Aava Kroese is a 34 year old female with a history of sickle cell anemia, HbSS presents for a hospital follow-up. She states that she has been having increased pain to the upper extremities and lower back that are consistent with sickle cell anemia. Patient was recently hospitalized for an acute vasoocclusive crisis on 08/25/2015. Patient is complaining of increased pain and frequent crises. She maintains that she has been taking all medications consistently.  Ms. Boers states that she has been taking Oxycodone 10 mg every 4 hours as needed. She last had Oxycodone last night with moderate relief.  She reports increased fatigue over the past month. Her current pain intensity is 5/10 to lower back and upper extremities described as intermittent and aching.  She denies fever, chest pains, nausea, vomiting, and diarrhea.  Past Medical History  Diagnosis Date  . Sickle cell disease ([place])   . Sickle cell disease, type S ([place])   . Blood transfusion   . 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 HCAP (healthcare-associated pneumonia) 05/19/2013  . Sickle cell anemia ([facility])   . 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 Vitamin B12 deficiency 07/07/2015   Immunization History  Administered Date(s) Administered  . Influenza Split 07/07/2012  . Influenza,inj,Quad PF,36+ Mos 07/26/2013, 06/13/2014  . Pneumococcal Conjugate-13 12/16/2014  . Pneumococcal Polysaccharide-23 06/26/2010, 07/07/2012  . Tdap 08/24/2013   Social History   Social History  . Marital Status: Single    Spouse Name: N/A  . Number of Children: N/A  . Years of Education: N/A   Occupational History  . Not on file.   Social History Main Topics  . Smoking status: Never Smoker   . Smokeless tobacco: Former Systems developer  . Alcohol Use: No  . Drug Use: No  . Sexual Activity: Yes    Birth Control/ Protection: IUD   Other Topics Concern  .  Not on file   Social History Narrative    Review of Systems  Constitutional: Negative for fever, fatigue and unexpected weight change.  HENT: Negative.   Eyes: Negative for visual disturbance.  Respiratory: Negative.  Negative for shortness of breath.   Endocrine: Negative for polydipsia, polyphagia and polyuria.  Genitourinary: Negative.  Negative for difficulty urinating.  Musculoskeletal: Positive for back pain. Myalgias: upper and lower extremities.  Skin: Negative.   Allergic/Immunologic: Negative for immunocompromised state.  Neurological: Negative.   Hematological: Negative.   Psychiatric/Behavioral: Negative.        Objective:   Physical Exam  Constitutional: She is oriented to person, place, and time. She appears well-developed and well-nourished.  HENT:  Head: Normocephalic and atraumatic.  Right Ear: External ear normal.  Left Ear: External ear normal.  Mouth/Throat: Oropharynx is clear and moist.  Eyes: Conjunctivae and EOM are normal. Pupils are equal, round, and reactive to light. Scleral icterus is present.  Neck: Normal range of motion. Neck supple.  Cardiovascular: Normal rate, normal heart sounds and intact distal pulses.   Pulmonary/Chest: Effort normal and breath sounds normal.  Abdominal: Soft. Bowel sounds are normal.  Musculoskeletal: Normal range of motion.  Neurological: She is alert and oriented to person, place, and time. She has normal reflexes.  Skin: Skin is warm and dry.  Psychiatric: She has a normal mood and affect. Her behavior is normal. Judgment and thought content normal.      BP 127/71 mmHg  Pulse  84  Temp(Src) 98.4 F (36.9 C) (Oral)  Resp 16  Ht 5\' 3"  (1.6 m)  Wt 160 lb (72.576 kg)  BMI 28.35 kg/m2  Assessment & Plan:   1. Hb-SS disease without crisis ([place]) 1Continue Hydrea 1000 mg daily (15 mg/kg), will not consider increasing dosage at this time. We discussed the need for good hydration, monitoring of hydration status,  avoidance of heat, cold, stress, and infection triggers. We discussed the risks and benefits of Hydrea, including bone marrow suppression, the possibility of GI upset, skin ulcers, hair thinning, and teratogenicity. The patient was reminded of the need to seek medical attention of any symptoms of bleeding, anemia, or infection. Continue folic acid 1 mg daily to prevent aplastic bone marrow crises. Discussed frequency of sickle cell crises at length. Reviewed urinalysis, mild proteinuria, improved from previous urinalysis.    Pulmonary evaluation - Patient denies severe recurrent wheezes, shortness of breath with exercise, or persistent cough. If these symptoms develop, pulmonary function tests with spirometry will be ordered, and if abnormal, plan on referral to Pulmonology for further evaluation.  Cardiac - Routine screening for pulmonary hypertension is not recommended.  Eye - High risk of proliferative retinopathy. Annual eye exam with retinal exam recommended to patient. Patient recently referred to opthalmology   Immunization status - Alyra is up to date with immunizations  Acute and chronic painful episodes - We agreed on starting Oxycontin 10 mg every 12 hours for increased pain. Continue Oxycodone 10 mg every 6 hours as needed for moderate to severe pain. We discussed that pt is to receive her Schedule II prescriptions only from [place]. Pt is also aware that the prescription history is available to [place] online through the [facility] CSRS. Controlled substance agreement signed previously. We reminded [place] that all patients receiving Schedule II narcotics must be seen for follow within one month of prescription being requested. We reviewed the terms of our pain agreement, including the need to keep medicines in a safe locked location away from children or pets, and the need to report excess sedation or constipation, measures to avoid constipation, and policies related to early refills and stolen prescriptions.  According to the [place] Chronic [facility] program, we have reviewed details related to analgesia, adverse effects, aberrant behaviors. Reviewed [place] Substance Reporting system prior to prescribing opiate medications, no inconsistencies noted.   - POCT urinalysis dip (device) - hydroxyurea (HYDREA) 500 MG capsule; Take 2 capsules (1,000 mg total) by mouth daily. May take with food to minimize GI side effects.  Dispense: 60 capsule; Refill: 11 - oxyCODONE (OXYCONTIN) 10 mg 12 hr tablet; Take 1 tablet (10 mg total) by mouth every 12 (twelve) hours.  Dispense: 60 tablet; Refill: 0 - Oxycodone HCl 10 MG TABS; Take 1 tablet (10 mg total) by mouth every 6 (six) hours as needed.  Dispense: 60 tablet; Refill: 0 - COMPLETE METABOLIC PANEL WITH GFR - CBC with Differential - Reticulocytes  Jerrika Ledlow M, FNP  RTC 1 month for medication management

## 2015-09-06 LAB — CBC WITH DIFFERENTIAL/PLATELET
BASOS ABS: 0 10*3/uL (ref 0.0–0.1)
Basophils Relative: 0 % (ref 0–1)
EOS ABS: 0.1 10*3/uL (ref 0.0–0.7)
EOS PCT: 1 % (ref 0–5)
HCT: 27.2 % — ABNORMAL LOW (ref 36.0–46.0)
Hemoglobin: 8.8 g/dL — ABNORMAL LOW (ref 12.0–15.0)
LYMPHS ABS: 2.1 10*3/uL (ref 0.7–4.0)
LYMPHS PCT: 36 % (ref 12–46)
MCH: 31.8 pg (ref 26.0–34.0)
MCHC: 32.4 g/dL (ref 30.0–36.0)
MCV: 98.2 fL (ref 78.0–100.0)
MONO ABS: 0.9 10*3/uL (ref 0.1–1.0)
MONOS PCT: 16 % — AB (ref 3–12)
MPV: 10.5 fL (ref 8.6–12.4)
Neutro Abs: 2.7 10*3/uL (ref 1.7–7.7)
Neutrophils Relative %: 47 % (ref 43–77)
PLATELETS: 325 10*3/uL (ref 150–400)
RBC: 2.77 MIL/uL — ABNORMAL LOW (ref 3.87–5.11)
RDW: 26.7 % — ABNORMAL HIGH (ref 11.5–15.5)
WBC: 5.7 10*3/uL (ref 4.0–10.5)

## 2015-09-06 LAB — RETICULOCYTES
ABS RETIC: 254.8 10*3/uL — AB (ref 19.0–186.0)
RBC.: 2.77 MIL/uL — AB (ref 3.87–5.11)
RETIC CT PCT: 9.2 % — AB (ref 0.4–2.3)

## 2015-09-13 ENCOUNTER — Ambulatory Visit: Payer: Self-pay | Admitting: Internal Medicine

## 2015-09-27 ENCOUNTER — Encounter (HOSPITAL_COMMUNITY): Payer: Self-pay | Admitting: Emergency Medicine

## 2015-09-27 ENCOUNTER — Emergency Department (HOSPITAL_COMMUNITY)
Admission: EM | Admit: 2015-09-27 | Discharge: 2015-09-27 | Disposition: A | Discharge status: Home / Self Care | Payer: BLUE CROSS/BLUE SHIELD | Attending: Emergency Medicine | Admitting: Emergency Medicine

## 2015-09-27 DIAGNOSIS — Z8701 Personal history of pneumonia (recurrent): Secondary | ICD-10-CM | POA: Insufficient documentation

## 2015-09-27 DIAGNOSIS — E538 Deficiency of other specified B group vitamins: Secondary | ICD-10-CM | POA: Insufficient documentation

## 2015-09-27 DIAGNOSIS — Z79899 Other long term (current) drug therapy: Secondary | ICD-10-CM | POA: Diagnosis not present

## 2015-09-27 DIAGNOSIS — D5700 Hb-SS disease with crisis, unspecified: Secondary | ICD-10-CM | POA: Diagnosis not present

## 2015-09-27 DIAGNOSIS — Z8679 Personal history of other diseases of the circulatory system: Secondary | ICD-10-CM | POA: Insufficient documentation

## 2015-09-27 DIAGNOSIS — Z8659 Personal history of other mental and behavioral disorders: Secondary | ICD-10-CM | POA: Diagnosis not present

## 2015-09-27 DIAGNOSIS — Z91040 Latex allergy status: Secondary | ICD-10-CM | POA: Diagnosis not present

## 2015-09-27 DIAGNOSIS — M545 Low back pain: Secondary | ICD-10-CM | POA: Diagnosis present

## 2015-09-27 LAB — BASIC METABOLIC PANEL
ANION GAP: 7 (ref 5–15)
BUN: 6 mg/dL (ref 6–20)
CALCIUM: 8.9 mg/dL (ref 8.9–10.3)
CO2: 25 mmol/L (ref 22–32)
Chloride: 106 mmol/L (ref 101–111)
Creatinine, Ser: 0.48 mg/dL (ref 0.44–1.00)
Glucose, Bld: 86 mg/dL (ref 65–99)
Potassium: 3.6 mmol/L (ref 3.5–5.1)
SODIUM: 138 mmol/L (ref 135–145)

## 2015-09-27 LAB — CBC WITH DIFFERENTIAL/PLATELET
BASOS ABS: 0 10*3/uL (ref 0.0–0.1)
BASOS PCT: 1 %
EOS PCT: 1 %
Eosinophils Absolute: 0.1 10*3/uL (ref 0.0–0.7)
HEMATOCRIT: 23.3 % — AB (ref 36.0–46.0)
Hemoglobin: 8.3 g/dL — ABNORMAL LOW (ref 12.0–15.0)
LYMPHS PCT: 33 %
Lymphs Abs: 1.9 10*3/uL (ref 0.7–4.0)
MCH: 34 pg (ref 26.0–34.0)
MCHC: 35.6 g/dL (ref 30.0–36.0)
MCV: 95.5 fL (ref 78.0–100.0)
Monocytes Absolute: 0.9 10*3/uL (ref 0.1–1.0)
Monocytes Relative: 16 %
Neutro Abs: 2.8 10*3/uL (ref 1.7–7.7)
Neutrophils Relative %: 50 %
PLATELETS: 164 10*3/uL (ref 150–400)
RBC: 2.44 MIL/uL — AB (ref 3.87–5.11)
RDW: 27.4 % — AB (ref 11.5–15.5)
WBC: 5.6 10*3/uL (ref 4.0–10.5)

## 2015-09-27 LAB — RETICULOCYTES
RBC.: 2.44 MIL/uL — ABNORMAL LOW (ref 3.87–5.11)
RETIC COUNT ABSOLUTE: 253.8 10*3/uL — AB (ref 19.0–186.0)
RETIC CT PCT: 10.4 % — AB (ref 0.4–3.1)

## 2015-09-27 LAB — HCG, SERUM, QUALITATIVE: PREG SERUM: NEGATIVE

## 2015-09-27 MED ORDER — HYDROMORPHONE HCL 1 MG/ML IJ SOLN
1.0000 mg | INTRAMUSCULAR | Status: AC | PRN
  Administered 2015-09-27 (×3): 1 mg via INTRAVENOUS
  Filled 2015-09-27 (×3): qty 1

## 2015-09-27 MED ORDER — HYDROMORPHONE HCL 1 MG/ML IJ SOLN
1.0000 mg | Freq: Once | INTRAMUSCULAR | Status: AC
Start: 2015-09-27 — End: 2015-09-27
  Administered 2015-09-27: 1 mg via INTRAVENOUS
  Filled 2015-09-27: qty 1

## 2015-09-27 MED ORDER — PROMETHAZINE HCL 25 MG/ML IJ SOLN
25.0000 mg | Freq: Once | INTRAMUSCULAR | Status: AC
  Administered 2015-09-27: 25 mg via INTRAVENOUS
  Filled 2015-09-27: qty 1

## 2015-09-27 NOTE — ED Notes (Signed)
Pt states sister is picking her up.

## 2015-09-27 NOTE — ED Provider Notes (Signed)
CSN: [id number]     Arrival date & time 09/27/15  0802 History  By signing my name below, I, Terressa Koyanagi, attest that this documentation has been prepared under the direction and in the presence of No att. providers found. Electronically Signed: Terressa Koyanagi, ED Scribe. 09/27/2015. 9:20 AM.  Chief Complaint  Patient presents with  . Back Pain  . Leg Pain    right and left   The history is provided by the patient. No language interpreter was used.   PCP: [name],[name], MD HPI Comments: Dawn [name] is a 35 y.o. female, with PMHx noted below including sickle cell Hb-SS disease, who presents to the Emergency Department complaining of atraumatic, intermittent, severe, 10/10, sharp lower back pain and bilateral leg pain onset 4-5 days ago. Pt reports current Sx are consistent with prior sickle cell crisis pain. Pt reports she tried her home meds, oxycodone, for pain without significant improvement. Pt denies fever, cough, SOB, chest pain, n/v/d, abd pain, numbness of BLE, weakness of BLE, dysuria, or any other urinary Sx.   Past Medical History  Diagnosis Date  . Sickle cell disease ([place])   . 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 Blood transfusion   . 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 HCAP (healthcare-associated pneumonia) 05/19/2013  . Sickle cell anemia ([facility])   . 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 Vitamin B12 deficiency 07/07/2015   Past Surgical History  Procedure Laterality Date  . Cholecystectomy    .  left knee acl reconstruction    . Cesarean section      x 2  . Portacath placement    . Portacath placement Left 02/23/2013    Procedure: INSERTION PORT-A-CATH;  Surgeon: Donato Heinz, MD;  Location: [facility];  Service: General;  Laterality: Left;  . Port-a-cath removal Left 07/29/2013    Procedure: REMOVAL PORT-A-CATH;  Surgeon: Scherry Ran, MD;  Location: [facility];  Service: General;  Laterality: Left;  . Tee without cardioversion N/A 08/07/2013     Procedure: TRANSESOPHAGEAL ECHOCARDIOGRAM (TEE);  Surgeon: Arnoldo Lenis, MD;  Location: [facility];  Service: Cardiology;  Laterality: N/A;   Family History  Problem Relation Age of Onset  . Sickle cell trait Mother   . Sickle cell trait Father   . Hypertension Father   . Diabetes Father   . Sickle cell trait Sister   . Cancer Paternal Aunt     Breast   Social History  Substance Use Topics  . Smoking status: Never Smoker   . Smokeless tobacco: Former Systems developer  . Alcohol Use: No   OB History    Gravida Para Term Preterm AB TAB SAB Ectopic Multiple Living   1 1  1      1      Review of Systems  Respiratory: Negative for shortness of breath.   Cardiovascular: Negative for chest pain.  Gastrointestinal: Negative for nausea, vomiting, abdominal pain and diarrhea.  Genitourinary: Negative for dysuria.  Musculoskeletal: Positive for arthralgias (bilateral legs).  Neurological: Negative for weakness and numbness.  All other systems reviewed and are negative.     Allergies  Desferal; Latex; Lisinopril; and Tape  Home Medications   Prior to Admission medications   Medication Sig Start Date End Date Taking? Authorizing Provider  cetirizine (ZYRTEC) 10 MG tablet TAKE 1 TABLET (10 MG TOTAL) BY MOUTH DAILY. 05/06/15  Yes Historical Provider, MD  folic acid (FOLVITE) 1 MG tablet Take 1 tablet (1 mg total) by  mouth daily. 08/16/15  Yes [name], FNP  hydroxyurea (HYDREA) 500 MG capsule Take 2 capsules (1,000 mg total) by mouth daily. May take with food to minimize GI side effects. 09/05/15  Yes [name], FNP  ibuprofen (ADVIL,MOTRIN) 200 MG tablet Take 400 mg by mouth every 6 (six) hours as needed.   Yes Historical Provider, MD  Oxycodone HCl 10 MG TABS Take 1 tablet (10 mg total) by mouth every 6 (six) hours as needed. 09/05/15  Yes [name], FNP  promethazine (PHENERGAN) 12.5 MG tablet Take 1 tablet (12.5 mg total) by mouth every 6 (six) hours as needed for  nausea. 08/31/15  Yes [name], MD  Vitamin D, Ergocalciferol, (DRISDOL) 50000 UNITS CAPS capsule Take 1 capsule (50,000 Units total) by mouth every 7 (seven) days. Takes on Sundays. 08/16/15  Yes [name], FNP   Triage Vitals: BP 131/85 mmHg  Pulse 99  Temp(Src) 98.4 F (36.9 C) (Oral)  Resp 14  Ht 5\' 3"  (1.6 m)  Wt 158 lb (71.668 kg)  BMI 28.00 kg/m2  SpO2 99% Physical Exam  Constitutional: She is oriented to person, place, and time. She appears well-developed and well-nourished.  HENT:  Head: Normocephalic.  Eyes: EOM are normal.  Neck: Normal range of motion.  Pulmonary/Chest: Effort normal.  Abdominal: She exhibits no distension.  Musculoskeletal: Normal range of motion.       Lumbar back: She exhibits tenderness (to palpation).  BLE pain to palpation. +2 DP pulses. Full strength in dorsi and plantar flexion. Sensation to light touch in BLE.   Neurological: She is alert and oriented to person, place, and time.  Psychiatric: She has a normal mood and affect.  Nursing note and vitals reviewed.   ED Course  Procedures (including critical care time) DIAGNOSTIC STUDIES: Oxygen Saturation is 99% on ra, nl by my interpretation.    COORDINATION OF CARE: 9:05 AM: Discussed treatment plan which includes pain meds and labs with pt at bedside; patient verbalizes understanding and agrees with treatment plan.  Labs Review Labs Reviewed  CBC WITH DIFFERENTIAL/PLATELET - Abnormal; Notable for the following:    RBC 2.44 (*)    Hemoglobin 8.3 (*)    HCT 23.3 (*)    RDW 27.4 (*)    All other components within normal limits  RETICULOCYTES - Abnormal; Notable for the following:    Retic Ct Pct 10.4 (*)    RBC. 2.44 (*)    Retic Count, Manual 253.8 (*)    All other components within normal limits  BASIC METABOLIC PANEL  HCG, SERUM, QUALITATIVE   I have personally reviewed and evaluated these lab results as part of my medical decision-making.  MDM   Final diagnoses:   Sickle cell pain crisis ([place])    35 year old female with history of hemoglobin SS who presents with typical pain crises symptoms of lower extremity pain and low back pain. He is well-appearing and in no acute distress on presentation. Vital signs are within normal limits that overall she does appear comfortable. Extremities are neurovascularly intact, with full distal pain with palpation throughout her lower extremities and of her low back. Blood work shows stable anemia with appropriate reticulocytosis. She is not pregnant. Received Dilaudid 1 mg 4, with improvement of her pain back to 3 out of 10 which is her baseline. States that she is comfortable with discharge. She will follow-up in the sickle cell clinic this week. Strict return and follow-up instructions are reviewed. She expressed  understanding of all discharge instructions and felt comfortable with the plan of care.     I personally performed the services described in this documentation, which was scribed in my presence. The recorded information has been reviewed and is accurate.    Forde Dandy, MD 09/27/15 [phone]

## 2015-09-27 NOTE — ED Notes (Signed)
Having pain since 09-22-15.  Denies any injury.  Rates pain 10/10.

## 2015-09-27 NOTE — ED Notes (Signed)
Pt not ready for discharge.clicked by accident

## 2015-09-27 NOTE — Discharge Instructions (Signed)

## 2015-09-27 NOTE — ED Notes (Addendum)
Pt states back and bilateral leg pain from sickle cell. States home meds were not working for her. Pt takes OxyContin 10 and oxycodone 10mg  at home.

## 2015-09-28 ENCOUNTER — Telehealth: Payer: Self-pay | Admitting: Family Medicine

## 2015-09-28 NOTE — Telephone Encounter (Signed)
Patient called asking that we please resend fax. It was dated 1/16 and should have been dated 1/17.

## 2015-09-29 NOTE — Telephone Encounter (Signed)
This has been corrected and faxed back in. Thanks!

## 2015-10-06 ENCOUNTER — Telehealth: Payer: Self-pay

## 2015-10-06 ENCOUNTER — Telehealth (HOSPITAL_COMMUNITY): Payer: Self-pay | Admitting: Hematology

## 2015-10-06 ENCOUNTER — Ambulatory Visit: Payer: Self-pay | Admitting: Family Medicine

## 2015-10-06 ENCOUNTER — Non-Acute Institutional Stay (HOSPITAL_COMMUNITY)
Admission: AD | Admit: 2015-10-06 | Discharge: 2015-10-06 | Disposition: A | Discharge status: Home / Self Care | Payer: BLUE CROSS/BLUE SHIELD | Source: Ambulatory Visit | Attending: Internal Medicine | Admitting: Internal Medicine

## 2015-10-06 ENCOUNTER — Encounter (HOSPITAL_COMMUNITY): Payer: Self-pay | Admitting: *Deleted

## 2015-10-06 DIAGNOSIS — M79605 Pain in left leg: Secondary | ICD-10-CM | POA: Diagnosis not present

## 2015-10-06 DIAGNOSIS — D5700 Hb-SS disease with crisis, unspecified: Secondary | ICD-10-CM | POA: Diagnosis present

## 2015-10-06 DIAGNOSIS — M79604 Pain in right leg: Secondary | ICD-10-CM | POA: Diagnosis not present

## 2015-10-06 DIAGNOSIS — M79602 Pain in left arm: Secondary | ICD-10-CM | POA: Diagnosis not present

## 2015-10-06 DIAGNOSIS — Z79899 Other long term (current) drug therapy: Secondary | ICD-10-CM | POA: Diagnosis not present

## 2015-10-06 DIAGNOSIS — D571 Sickle-cell disease without crisis: Secondary | ICD-10-CM

## 2015-10-06 DIAGNOSIS — M79601 Pain in right arm: Secondary | ICD-10-CM | POA: Insufficient documentation

## 2015-10-06 LAB — COMPREHENSIVE METABOLIC PANEL
ALBUMIN: 3.2 g/dL — AB (ref 3.5–5.0)
ALK PHOS: 165 U/L — AB (ref 38–126)
ALT: 38 U/L (ref 14–54)
AST: 80 U/L — ABNORMAL HIGH (ref 15–41)
Anion gap: 8 (ref 5–15)
BILIRUBIN TOTAL: 7.9 mg/dL — AB (ref 0.3–1.2)
BUN: 6 mg/dL (ref 6–20)
CALCIUM: 9 mg/dL (ref 8.9–10.3)
CO2: 24 mmol/L (ref 22–32)
CREATININE: 0.47 mg/dL (ref 0.44–1.00)
Chloride: 107 mmol/L (ref 101–111)
GFR calc non Af Amer: >60 mL/min (ref >60)
GLUCOSE: 90 mg/dL (ref 65–99)
Potassium: 3.9 mmol/L (ref 3.5–5.1)
SODIUM: 139 mmol/L (ref 135–145)
Total Protein: 8.4 g/dL — ABNORMAL HIGH (ref 6.5–8.1)

## 2015-10-06 LAB — URINE MICROSCOPIC-ADD ON: WBC UA: NONE SEEN WBC/hpf (ref 0–5)

## 2015-10-06 LAB — URINALYSIS, ROUTINE W REFLEX MICROSCOPIC
Glucose, UA: NEGATIVE mg/dL
Ketones, ur: NEGATIVE mg/dL
LEUKOCYTES UA: NEGATIVE
NITRITE: NEGATIVE
PH: 6.5 (ref 5.0–8.0)
Protein, ur: 100 mg/dL — AB
SPECIFIC GRAVITY, URINE: 1.011 (ref 1.005–1.030)

## 2015-10-06 LAB — CBC WITH DIFFERENTIAL/PLATELET
BASOS PCT: 1 %
Basophils Absolute: 0.1 10*3/uL (ref 0.0–0.1)
EOS PCT: 1 %
Eosinophils Absolute: 0.1 10*3/uL (ref 0.0–0.7)
HEMATOCRIT: 23.7 % — AB (ref 36.0–46.0)
HEMOGLOBIN: 8.4 g/dL — AB (ref 12.0–15.0)
Lymphocytes Relative: 36 %
Lymphs Abs: 2.1 10*3/uL (ref 0.7–4.0)
MCH: 34.9 pg — AB (ref 26.0–34.0)
MCHC: 35.4 g/dL (ref 30.0–36.0)
MCV: 98.3 fL (ref 78.0–100.0)
MONO ABS: 0.9 10*3/uL (ref 0.1–1.0)
Monocytes Relative: 15 %
NEUTROS ABS: 2.5 10*3/uL (ref 1.7–7.7)
NEUTROS PCT: 47 %
Platelets: 170 10*3/uL (ref 150–400)
RBC: 2.41 MIL/uL — ABNORMAL LOW (ref 3.87–5.11)
RDW: 25.5 % — ABNORMAL HIGH (ref 11.5–15.5)
WBC: 5.7 10*3/uL (ref 4.0–10.5)

## 2015-10-06 LAB — RETICULOCYTES
RBC.: 2.41 MIL/uL — ABNORMAL LOW (ref 3.87–5.11)
Retic Count, Absolute: 320.5 10*3/uL — ABNORMAL HIGH (ref 19.0–186.0)
Retic Ct Pct: 13.3 % — ABNORMAL HIGH (ref 0.4–3.1)

## 2015-10-06 LAB — LACTATE DEHYDROGENASE: LDH: 275 U/L — ABNORMAL HIGH (ref 98–192)

## 2015-10-06 MED ORDER — MORPHINE SULFATE ER 15 MG PO TBCR: 15.0000 mg | EXTENDED_RELEASE_TABLET | Freq: Two times a day (BID) | ORAL | Status: DC

## 2015-10-06 MED ORDER — SODIUM CHLORIDE 0.9 % IJ SOLN: 9.0000 mL | INTRAMUSCULAR | Status: DC | PRN

## 2015-10-06 MED ORDER — ONDANSETRON HCL 4 MG/2ML IJ SOLN
4.0000 mg | Freq: Four times a day (QID) | INTRAMUSCULAR | Status: DC | PRN
  Administered 2015-10-06: 4 mg via INTRAVENOUS
  Filled 2015-10-06: qty 2

## 2015-10-06 MED ORDER — DEXTROSE-NACL 5-0.45 % IV SOLN
INTRAVENOUS | Status: DC
  Administered 2015-10-06: 11:00:00 via INTRAVENOUS

## 2015-10-06 MED ORDER — KETOROLAC TROMETHAMINE 30 MG/ML IJ SOLN
30.0000 mg | Freq: Once | INTRAMUSCULAR | Status: AC
  Administered 2015-10-06: 30 mg via INTRAVENOUS
  Filled 2015-10-06: qty 1

## 2015-10-06 MED ORDER — SODIUM CHLORIDE 0.9 % IV SOLN
12.5000 mg | Freq: Four times a day (QID) | INTRAVENOUS | Status: DC | PRN
  Filled 2015-10-06: qty 0.25

## 2015-10-06 MED ORDER — HYDROMORPHONE 1 MG/ML IV SOLN
INTRAVENOUS | Status: DC
  Administered 2015-10-06: 12:00:00 via INTRAVENOUS
  Filled 2015-10-06: qty 25

## 2015-10-06 MED ORDER — DIPHENHYDRAMINE HCL 12.5 MG/5ML PO ELIX: 12.5000 mg | ORAL_SOLUTION | Freq: Four times a day (QID) | ORAL | Status: DC | PRN

## 2015-10-06 MED ORDER — OXYCODONE HCL 5 MG PO TABS
10.0000 mg | ORAL_TABLET | Freq: Once | ORAL | Status: AC
  Administered 2015-10-06: 10 mg via ORAL
  Filled 2015-10-06: qty 2

## 2015-10-06 MED ORDER — NALOXONE HCL 0.4 MG/ML IJ SOLN: 0.4000 mg | INTRAMUSCULAR | Status: DC | PRN

## 2015-10-06 MED ORDER — OXYCODONE HCL 10 MG PO TABS: 10.0000 mg | ORAL_TABLET | Freq: Four times a day (QID) | ORAL | Status: DC | PRN

## 2015-10-06 NOTE — Telephone Encounter (Signed)
Reviewed [place] Substance Reporting system prior to prescribing opiate medications, no inconsistencies noted.  Will follow up in office in 1 month.   Meds ordered this encounter  Medications  . Oxycodone HCl 10 MG TABS    Sig: Take 1 tablet (10 mg total) by mouth every 6 (six) hours as needed.    Dispense:  60 tablet    Refill:  0    Order Specific Question:  Supervising Provider    Answer:  Tresa Garter [id number]  . morphine (MS CONTIN) 15 MG 12 hr tablet    Sig: Take 1 tablet (15 mg total) by mouth every 12 (twelve) hours.    Dispense:  60 tablet    Refill:  0    Order Specific Question:  Supervising Provider    Answer:  Tresa Garter [id number]     [name], FNP

## 2015-10-06 NOTE — Discharge Summary (Signed)
[facility] Discharge Summary   Patient ID: Dawn [name] MRN: [id number] DOB/AGE: 1981-04-02 35 y.o.  Admit date: 10/06/2015 Discharge date: 10/06/2015  Primary Care Physician:  Angelica Chessman, MD  Admission Diagnoses:  Active Problems:   Sickle cell anemia with crisis [facility])  Discharge Medications:    Medication List    TAKE these medications        cetirizine 10 MG tablet  Commonly known as:  ZYRTEC  TAKE 1 TABLET (10 MG TOTAL) BY MOUTH DAILY.     folic acid 1 MG tablet  Commonly known as:  FOLVITE  Take 1 tablet (1 mg total) by mouth daily.     hydroxyurea 500 MG capsule  Commonly known as:  HYDREA  Take 2 capsules (1,000 mg total) by mouth daily. May take with food to minimize GI side effects.     ibuprofen 200 MG tablet  Commonly known as:  ADVIL,MOTRIN  Take 400 mg by mouth every 6 (six) hours as needed.     Oxycodone HCl 10 MG Tabs  Take 1 tablet (10 mg total) by mouth every 6 (six) hours as needed.     promethazine 12.5 MG tablet  Commonly known as:  PHENERGAN  Take 1 tablet (12.5 mg total) by mouth every 6 (six) hours as needed for nausea.     Vitamin D (Ergocalciferol) 50000 units Caps capsule  Commonly known as:  DRISDOL  Take 1 capsule (50,000 Units total) by mouth every 7 (seven) days. Takes on Sundays.       Consults:  None  Significant Diagnostic Studies:  No results found.   [facility] Course: Dawn [name], a 35 year old female was admitted to the day infusion center for extended observation.  Reviewed labs; consistent with baseline.  Proteinuria present; will further evaluate during office visit Ms. Branford was started on a high dose PCA, she used a total of 5.5 mg with 11 demands and 11 deliveries. Pain intensity decreased from 9/10 to 5/10.  She was given Oxycodone 10 mg 45 minutes prior to discharge without complication. Patient states that she can manage on current pain medications.  Patient will discharge  home with family in stable condition   Physical Exam at Discharge: BP 125/67 mmHg  Pulse 85  Temp(Src) 97.5 F (36.4 C) (Oral)  Resp 13  Ht 5\' 3"  (1.6 m)  Wt 154 lb (69.854 kg)  BMI 27.29 kg/m2  SpO2 98%   General Appearance:    Alert, cooperative, mild distress, appears stated age  Head:    Normocephalic, without obvious abnormality, atraumatic  Eyes:    PERRL, conjunctiva/corneas clear, EOM's intact, fundi    benign, scleral icterus  Ears:    Normal TM's and external ear canals, both ears  Nose:   Nares normal, septum midline, mucosa normal, no drainage    or sinus tenderness  Throat:   Lips, mucosa, and tongue normal; teeth and gums normal  Neck:   Supple, symmetrical, trachea midline, no adenopathy;    thyroid:  no enlargement/tenderness/nodules; no carotid   bruit or JVD  Back:     Symmetric, no curvature, ROM normal, no CVA tenderness  Lungs:     Clear to auscultation bilaterally, respirations unlabored  Chest Wall:    No tenderness or deformity   Heart:    Regular rate and rhythm, S1 and S2 normal, no murmur, rub   or gallop  Extremities:   Extremities normal, atraumatic, no cyanosis or edema  Pulses:   2+ and symmetric all extremities  Skin:   Skin color, texture, turgor normal, no rashes or lesions  Lymph nodes:   Cervical, supraclavicular, and axillary nodes normal  Neurologic:   CNII-XII intact, normal strength, sensation and reflexes    throughout   Disposition at Discharge: 01-Home or Self Care  Discharge Orders:     Discharge Instructions    Discharge patient    Complete by:  As directed            Condition at Discharge:   Stable  Time spent on Discharge:  15 minutes.    Signed: Brandye Inthavong M 10/06/2015, 4:34 PM

## 2015-10-06 NOTE — Telephone Encounter (Signed)
Discussed with NP and patient can come to day hospital.

## 2015-10-06 NOTE — Progress Notes (Signed)
Patient admitted to [facility] with pain in legs and arms with a rating of 9/10. Patient treated with IV fluids, Dilaudid PCA, IV Toradol and po Oxycodone. At discharge patient's pain level was 5/10. Discharge instructions reviewed with patient and encouraged po fluids, take medications as prescribed. Patient understood instructions. Patient alert, oriented and ambulatory.  Discharge instructions given to patient. Discharged to home.

## 2015-10-06 NOTE — Telephone Encounter (Signed)
Patient walked in to office.  Had a office appointment at 8:30 but was late d/t traffic. Patient C/O pain to arms/legs that is 8/10 on pain scale.  Patient denies fever, chest pain, shortness of breath or N/V/D.  Patient took oxycodone at 7am, and her breakthrough pain medication around 4am.  I explained I would talk to the provider.  Patient verbalizes understanding.

## 2015-10-06 NOTE — H&P (Signed)
[facility] History and Physical   Date: 10/06/2015  Patient name: Dawn [name] Medical record number: [id number] Date of birth: March 14, 1981 Age: 35 y.o. Gender: female PCP: [name],[name], MD  Attending physician: Tresa Garter, MD  Chief Complaint: upper and lower extremities  History of Present Illness: [name], a 35 year old female with a history of sickle cell anemia presents with pain to upper and lower extremities for 3 days. She attributes current pain crisis to weather changes. She maintains that pain intensity is 9/10 describes as constant and throbbing. She last had oxycodone 10 mg around 4 am with minimal relief. She maintains that she is hydrating and taking all other medications consistently. Patient denies fever, chest pains, headache, shortness of breath, nausea, vomiting, or diarrhea.  Meds: Prescriptions prior to admission  Medication Sig Dispense Refill Last Dose  . cetirizine (ZYRTEC) 10 MG tablet TAKE 1 TABLET (10 MG TOTAL) BY MOUTH DAILY.  3 10/06/2015 at Unknown time  . folic acid (FOLVITE) 1 MG tablet Take 1 tablet (1 mg total) by mouth daily. 30 tablet 11 10/06/2015 at Unknown time  . hydroxyurea (HYDREA) 500 MG capsule Take 2 capsules (1,000 mg total) by mouth daily. May take with food to minimize GI side effects. 60 capsule 11 10/06/2015 at Unknown time  . Oxycodone HCl 10 MG TABS Take 1 tablet (10 mg total) by mouth every 6 (six) hours as needed. 60 tablet 0 10/06/2015 at Unknown time  . promethazine (PHENERGAN) 12.5 MG tablet Take 1 tablet (12.5 mg total) by mouth every 6 (six) hours as needed for nausea. 20 tablet 0 10/06/2015 at Unknown time  . Vitamin D, Ergocalciferol, (DRISDOL) 50000 UNITS CAPS capsule Take 1 capsule (50,000 Units total) by mouth every 7 (seven) days. Takes on Sundays. 4 capsule 11 Past Week at Unknown time  . ibuprofen (ADVIL,MOTRIN) 200 MG tablet Take 400 mg by mouth every 6 (six) hours as needed.   Unknown at  Unknown time    Allergies: Desferal; Latex; Lisinopril; and Tape Past Medical History  Diagnosis Date  . Sickle cell disease ([place])   . Sickle cell disease, type S ([place])   . Blood transfusion   . 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 HCAP (healthcare-associated pneumonia) 05/19/2013  . Sickle cell anemia ([facility])   . 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 Vitamin B12 deficiency 07/07/2015   Past Surgical History  Procedure Laterality Date  . Cholecystectomy    .  left knee acl reconstruction    . Cesarean section      x 2  . Portacath placement    . Portacath placement Left 02/23/2013    Procedure: INSERTION PORT-A-CATH;  Surgeon: Donato Heinz, MD;  Location: [facility];  Service: General;  Laterality: Left;  . Port-a-cath removal Left 07/29/2013    Procedure: REMOVAL PORT-A-CATH;  Surgeon: Scherry Ran, MD;  Location: [facility];  Service: General;  Laterality: Left;  . Tee without cardioversion N/A 08/07/2013    Procedure: TRANSESOPHAGEAL ECHOCARDIOGRAM (TEE);  Surgeon: Arnoldo Lenis, MD;  Location: [facility];  Service: Cardiology;  Laterality: N/A;   Family History  Problem Relation Age of Onset  . Sickle cell trait Mother   . Sickle cell trait Father   . Hypertension Father   . Diabetes Father   . Sickle cell trait Sister   . Cancer Paternal Aunt     Breast   Social History   Social History  . Marital  Status: Single    Spouse Name: N/A  . Number of Children: N/A  . Years of Education: N/A   Occupational History  . Not on file.   Social History Main Topics  . Smoking status: Never Smoker   . Smokeless tobacco: Former Systems developer  . Alcohol Use: No  . Drug Use: No  . Sexual Activity: Yes    Birth Control/ Protection: IUD   Other Topics Concern  . Not on file   Social History Narrative    Review of Systems: Constitutional: positive for fatigue Eyes: positive for icterus Ears, nose, mouth, throat, and face: negative Respiratory: negative for cough,  sputum and wheezing Cardiovascular: negative for chest pain Gastrointestinal: negative for abdominal pain Genitourinary:negative for dysuria and frequency Integument/breast: negative Hematologic/lymphatic: negative Musculoskeletal:positive for myalgias Neurological: negative Behavioral/Psych: negative Allergic/Immunologic: negative  Physical Exam: Blood pressure 129/75, pulse 91, temperature 98.6 F (37 C), temperature source Oral, resp. rate 16, height 5\' 3"  (1.6 m), weight 154 lb (69.854 kg), SpO2 99 %.   General Appearance:    Alert, cooperative, mild distress, appears stated age  Head:    Normocephalic, without obvious abnormality, atraumatic  Eyes:    PERRL, conjunctiva/corneas clear, EOM's intact, fundi    benign, scleral icterus  Ears:    Normal TM's and external ear canals, both ears  Nose:   Nares normal, septum midline, mucosa normal, no drainage    or sinus tenderness  Throat:   Lips, mucosa, and tongue normal; teeth and gums normal  Neck:   Supple, symmetrical, trachea midline, no adenopathy;    thyroid:  no enlargement/tenderness/nodules; no carotid   bruit or JVD  Back:     Symmetric, no curvature, ROM normal, no CVA tenderness  Lungs:     Clear to auscultation bilaterally, respirations unlabored  Chest Wall:    No tenderness or deformity   Heart:    Regular rate and rhythm, S1 and S2 normal, no murmur, rub   or gallop  Abdomen:     Soft, non-tender, bowel sounds active all four quadrants,    no masses, no organomegaly  Extremities:   Extremities normal, atraumatic, no cyanosis or edema  Pulses:   2+ and symmetric all extremities  Skin:   Skin color, texture, turgor normal, no rashes or lesions  Lymph nodes:   Cervical, supraclavicular, and axillary nodes normal  Neurologic:   CNII-XII intact, normal strength, sensation and reflexes    throughout    Lab results: Results for orders placed or performed during the hospital encounter of 10/06/15 (from the past 24  hour(s))  Urinalysis, Routine w reflex microscopic (not at [facility])     Status: Abnormal   Collection Time: 10/06/15 10:03 AM  Result Value Ref Range   Color, Urine AMBER (A) YELLOW   APPearance CLEAR CLEAR   Specific Gravity, Urine 1.011 1.005 - 1.030   pH 6.5 5.0 - 8.0   Glucose, UA NEGATIVE NEGATIVE mg/dL   Hgb urine dipstick MODERATE (A) NEGATIVE   Bilirubin Urine SMALL (A) NEGATIVE   Ketones, ur NEGATIVE NEGATIVE mg/dL   Protein, ur 100 (A) NEGATIVE mg/dL   Nitrite NEGATIVE NEGATIVE   Leukocytes, UA NEGATIVE NEGATIVE  Urine microscopic-add on     Status: Abnormal   Collection Time: 10/06/15 10:03 AM  Result Value Ref Range   Squamous Epithelial / LPF 6-30 (A) NONE SEEN   WBC, UA NONE SEEN 0 - 5 WBC/hpf   RBC / HPF 6-30 0 - 5 RBC/hpf  Bacteria, UA RARE (A) NONE SEEN   Casts GRANULAR CAST (A) NEGATIVE  CBC with Differential/Platelet     Status: Abnormal   Collection Time: 10/06/15 11:30 AM  Result Value Ref Range   WBC 5.7 4.0 - 10.5 K/uL   RBC 2.41 (L) 3.87 - 5.11 MIL/uL   Hemoglobin 8.4 (L) 12.0 - 15.0 g/dL   HCT 23.7 (L) 36.0 - 46.0 %   MCV 98.3 78.0 - 100.0 fL   MCH 34.9 (H) 26.0 - 34.0 pg   MCHC 35.4 30.0 - 36.0 g/dL   RDW 25.5 (H) 11.5 - 15.5 %   Platelets 170 150 - 400 K/uL   Neutrophils Relative % 47 %   Lymphocytes Relative 36 %   Monocytes Relative 15 %   Eosinophils Relative 1 %   Basophils Relative 1 %   Neutro Abs 2.5 1.7 - 7.7 K/uL   Lymphs Abs 2.1 0.7 - 4.0 K/uL   Monocytes Absolute 0.9 0.1 - 1.0 K/uL   Eosinophils Absolute 0.1 0.0 - 0.7 K/uL   Basophils Absolute 0.1 0.0 - 0.1 K/uL   RBC Morphology POLYCHROMASIA PRESENT   Comprehensive metabolic panel     Status: Abnormal   Collection Time: 10/06/15 11:30 AM  Result Value Ref Range   Sodium 139 135 - 145 mmol/L   Potassium 3.9 3.5 - 5.1 mmol/L   Chloride 107 101 - 111 mmol/L   CO2 24 22 - 32 mmol/L   Glucose, Bld 90 65 - 99 mg/dL   BUN 6 6 - 20 mg/dL   Creatinine, Ser 0.47 0.44 - 1.00 mg/dL    Calcium 9.0 8.9 - 10.3 mg/dL   Total Protein 8.4 (H) 6.5 - 8.1 g/dL   Albumin 3.2 (L) 3.5 - 5.0 g/dL   AST 80 (H) 15 - 41 U/L   ALT 38 14 - 54 U/L   Alkaline Phosphatase 165 (H) 38 - 126 U/L   Total Bilirubin 7.9 (H) 0.3 - 1.2 mg/dL   GFR calc non Af Amer >60 >60 mL/min   GFR calc Af Amer >60 >60 mL/min   Anion gap 8 5 - 15  Lactate dehydrogenase     Status: Abnormal   Collection Time: 10/06/15 11:30 AM  Result Value Ref Range   LDH 275 (H) 98 - 192 U/L  Reticulocytes     Status: Abnormal   Collection Time: 10/06/15 11:30 AM  Result Value Ref Range   Retic Ct Pct 13.3 (H) 0.4 - 3.1 %   RBC. 2.41 (L) 3.87 - 5.11 MIL/uL   Retic Count, Manual 320.5 (H) 19.0 - 186.0 K/uL    Imaging results:  No results found.   Assessment & Plan:  Patient will be admitted to the day infusion center for extended observation  Start IV D5.45 for cellular rehydration at 200/hr  Start Toradol 30 mg IV every 6 hours for inflammation.  Start Dilaudid PCA High Concentration per weight based protocol.   Patient will be re-evaluated for pain intensity in the context of function and relationship to baseline as care progresses.  If no significant pain relief, will transfer patient to inpatient services for a higher level of care.   Will check CMP, reticulocytes,   LDH and CBC w/differential  Halleigh Comes M 10/06/2015, 10:37 AM

## 2015-10-06 NOTE — Telephone Encounter (Signed)
Refill Request for Oxycodone 10mg  and MS Contin 10mg . LOV 09/05/2015. Please advise. Thanks!

## 2015-10-14 ENCOUNTER — Ambulatory Visit (INDEPENDENT_AMBULATORY_CARE_PROVIDER_SITE_OTHER): Payer: BLUE CROSS/BLUE SHIELD | Admitting: Family Medicine

## 2015-10-14 ENCOUNTER — Encounter: Payer: Self-pay | Admitting: Family Medicine

## 2015-10-14 VITALS — BP 129/78 | HR 93 | Temp 98.5°F | Resp 14 | Wt 164.0 lb

## 2015-10-14 DIAGNOSIS — G894 Chronic pain syndrome: Secondary | ICD-10-CM

## 2015-10-14 DIAGNOSIS — D571 Sickle-cell disease without crisis: Secondary | ICD-10-CM

## 2015-10-14 NOTE — Progress Notes (Signed)
Subjective:    Patient ID: Dawn [name], female    DOB: [birth year], 35 y.o.   MRN: [id number]  HPI Dawn [name] is a 35 year old female with a history of sickle cell anemia, HbSS presents for a hospital follow-up. She states that she has been having increased pain to the lower extremities and lower back that are consistent with sickle cell anemia.  She maintains that she has been taking all medications consistently.  She states that pain is constant. Dawn [name] states that she has been taking Oxycodone 10 mg every 4 hours as needed. She last had Oxycodone 2 days ago with moderate relief.  She reports increased fatigue over the past month.  She denies fever, chest pains, nausea, vomiting, and diarrhea.  Past Medical History  Diagnosis Date  . Sickle cell disease ([place])   . 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 Blood transfusion   . 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 HCAP (healthcare-associated pneumonia) 05/19/2013  . Sickle cell anemia ([facility])   . 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 Vitamin B12 deficiency 07/07/2015   Immunization History  Administered Date(s) Administered  . Influenza Split 07/07/2012  . Influenza,inj,Quad PF,36+ Mos 07/26/2013, 06/13/2014  . Pneumococcal Conjugate-13 12/16/2014  . Pneumococcal Polysaccharide-23 06/26/2010, 07/07/2012  . Tdap 08/24/2013   Social History   Social History  . Marital Status: Single    Spouse Name: N/A  . Number of Children: N/A  . Years of Education: N/A   Occupational History  . Not on file.   Social History Main Topics  . Smoking status: Never Smoker   . Smokeless tobacco: Former Systems developer  . Alcohol Use: No  . Drug Use: No  . Sexual Activity: Yes    Birth Control/ Protection: IUD   Other Topics Concern  . Not on file   Social History Narrative    Review of Systems  Constitutional: Negative for fever, fatigue and unexpected weight change.  HENT: Negative.   Eyes: Negative for visual disturbance.  Respiratory:  Negative.  Negative for shortness of breath.   Endocrine: Negative for polydipsia, polyphagia and polyuria.  Genitourinary: Negative.  Negative for difficulty urinating.  Musculoskeletal: Positive for back pain. Myalgias: upper and lower extremities.  Skin: Negative.   Allergic/Immunologic: Negative for immunocompromised state.  Neurological: Negative.   Hematological: Negative.   Psychiatric/Behavioral: Negative.        Objective:   Physical Exam  Constitutional: She is oriented to person, place, and time. She appears well-developed and well-nourished.  HENT:  Head: Normocephalic and atraumatic.  Right Ear: External ear normal.  Left Ear: External ear normal.  Mouth/Throat: Oropharynx is clear and moist.  Eyes: Conjunctivae and EOM are normal. Pupils are equal, round, and reactive to light. Scleral icterus is present.  Neck: Normal range of motion. Neck supple.  Cardiovascular: Normal rate, normal heart sounds and intact distal pulses.   Pulmonary/Chest: Effort normal and breath sounds normal.  Abdominal: Soft. Bowel sounds are normal.  Musculoskeletal: Normal range of motion.  Neurological: She is alert and oriented to person, place, and time. She has normal reflexes.  Skin: Skin is warm and dry.  Psychiatric: She has a normal mood and affect. Her behavior is normal. Judgment and thought content normal.      BP 129/78 mmHg  Pulse 93  Temp(Src) 98.5 F (36.9 C) (Oral)  Resp 14  Wt 164 lb (74.39 kg)  Assessment & Plan:   1. Hb-SS disease without crisis ([place])  1Continue Hydrea 1000 mg daily (15 mg/kg), will not consider increasing dosage at this time. Reviewed ANC, platelets, and hemoglobin, and reticulocytes. We discussed the need for good hydration, monitoring of hydration status, avoidance of heat, cold, stress, and infection triggers. We discussed the risks and benefits of Hydrea, including bone marrow suppression, the possibility of GI upset, skin ulcers, hair thinning, and  teratogenicity. The patient was reminded of the need to seek medical attention of any symptoms of bleeding, anemia, or infection. Continue folic acid 1 mg daily to prevent aplastic bone marrow crises. Discussed frequency of sickle cell crises at length. Reviewed urinalysis, mild proteinuria, improved from previous urinalysis.    Pulmonary evaluation - Patient denies severe recurrent wheezes, shortness of breath with exercise, or persistent cough. If these symptoms develop, pulmonary function tests with spirometry will be ordered, and if abnormal, plan on referral to Pulmonology for further evaluation.  Cardiac - Routine screening for pulmonary hypertension is not recommended.  Eye - High risk of proliferative retinopathy. Annual eye exam with retinal exam recommended to patient. Patient recently referred to opthalmology   Immunization status - Blenda is up to date with immunizations  Acute and chronic painful episodes - We agreed on starting MS Contin 15 mg every 12 hours for increased pain. Continue Oxycodone 10 mg every 6 hours as needed for moderate to severe pain. We discussed that pt is to receive her Schedule II prescriptions only from [place]. Pt is also aware that the prescription history is available to [place] online through the [facility] CSRS. Controlled substance agreement signed previously. We reminded J[place]ita that all patients receiving Schedule II narcotics must be seen for follow within one month of prescription being requested. We reviewed the terms of our pain agreement, including the need to keep medicines in a safe locked location away from children or pets, and the need to report excess sedation or constipation, measures to avoid constipation, and policies related to early refills and stolen prescriptions. According to the [place] [facility] program, we have reviewed details related to analgesia, adverse effects, aberrant behaviors. Reviewed [place] Substance Reporting system prior to prescribing opiate  medications, no inconsistencies noted.   Lener Ventresca M, FNP  RTC 1 month for medication management

## 2015-10-17 ENCOUNTER — Encounter (HOSPITAL_COMMUNITY): Payer: Self-pay | Admitting: *Deleted

## 2015-10-17 ENCOUNTER — Emergency Department (HOSPITAL_COMMUNITY): Payer: BLUE CROSS/BLUE SHIELD

## 2015-10-17 ENCOUNTER — Emergency Department (HOSPITAL_COMMUNITY)
Admission: EM | Admit: 2015-10-17 | Discharge: 2015-10-17 | Disposition: A | Discharge status: Home / Self Care | Payer: BLUE CROSS/BLUE SHIELD | Attending: Emergency Medicine | Admitting: Emergency Medicine

## 2015-10-17 DIAGNOSIS — D5700 Hb-SS disease with crisis, unspecified: Secondary | ICD-10-CM | POA: Diagnosis not present

## 2015-10-17 DIAGNOSIS — Z8679 Personal history of other diseases of the circulatory system: Secondary | ICD-10-CM | POA: Insufficient documentation

## 2015-10-17 DIAGNOSIS — Z8659 Personal history of other mental and behavioral disorders: Secondary | ICD-10-CM | POA: Diagnosis not present

## 2015-10-17 DIAGNOSIS — Z91040 Latex allergy status: Secondary | ICD-10-CM | POA: Insufficient documentation

## 2015-10-17 DIAGNOSIS — Z79899 Other long term (current) drug therapy: Secondary | ICD-10-CM | POA: Insufficient documentation

## 2015-10-17 DIAGNOSIS — Z8639 Personal history of other endocrine, nutritional and metabolic disease: Secondary | ICD-10-CM | POA: Insufficient documentation

## 2015-10-17 DIAGNOSIS — Z8701 Personal history of pneumonia (recurrent): Secondary | ICD-10-CM | POA: Insufficient documentation

## 2015-10-17 LAB — URINALYSIS, ROUTINE W REFLEX MICROSCOPIC
Glucose, UA: NEGATIVE mg/dL
Ketones, ur: NEGATIVE mg/dL
Leukocytes, UA: NEGATIVE
NITRITE: NEGATIVE
PROTEIN: 100 mg/dL — AB
SPECIFIC GRAVITY, URINE: 1.02 (ref 1.005–1.030)
pH: 6 (ref 5.0–8.0)

## 2015-10-17 LAB — CBC WITH DIFFERENTIAL/PLATELET
BASOS ABS: 0 10*3/uL (ref 0.0–0.1)
BASOS PCT: 1 %
EOS ABS: 0.1 10*3/uL (ref 0.0–0.7)
Eosinophils Relative: 1 %
HCT: 23 % — ABNORMAL LOW (ref 36.0–46.0)
HEMOGLOBIN: 8.3 g/dL — AB (ref 12.0–15.0)
Lymphocytes Relative: 33 %
Lymphs Abs: 2.2 10*3/uL (ref 0.7–4.0)
MCH: 35.2 pg — AB (ref 26.0–34.0)
MCHC: 36.1 g/dL — AB (ref 30.0–36.0)
MCV: 97.5 fL (ref 78.0–100.0)
MONO ABS: 1 10*3/uL (ref 0.1–1.0)
Monocytes Relative: 15 %
NEUTROS ABS: 3.5 10*3/uL (ref 1.7–7.7)
NEUTROS PCT: 51 %
PLATELETS: 270 10*3/uL (ref 150–400)
RBC: 2.36 MIL/uL — ABNORMAL LOW (ref 3.87–5.11)
RDW: 26.1 % — ABNORMAL HIGH (ref 11.5–15.5)
WBC: 6.8 10*3/uL (ref 4.0–10.5)

## 2015-10-17 LAB — URINE MICROSCOPIC-ADD ON: WBC UA: NONE SEEN WBC/hpf (ref 0–5)

## 2015-10-17 LAB — COMPREHENSIVE METABOLIC PANEL
ALBUMIN: 3.1 g/dL — AB (ref 3.5–5.0)
ALT: 42 U/L (ref 14–54)
ANION GAP: 7 (ref 5–15)
AST: 114 U/L — ABNORMAL HIGH (ref 15–41)
Alkaline Phosphatase: 169 U/L — ABNORMAL HIGH (ref 38–126)
BUN: 6 mg/dL (ref 6–20)
CALCIUM: 8.4 mg/dL — AB (ref 8.9–10.3)
CO2: 23 mmol/L (ref 22–32)
Chloride: 107 mmol/L (ref 101–111)
Glucose, Bld: 101 mg/dL — ABNORMAL HIGH (ref 65–99)
POTASSIUM: 4.5 mmol/L (ref 3.5–5.1)
SODIUM: 137 mmol/L (ref 135–145)
TOTAL PROTEIN: 8.1 g/dL (ref 6.5–8.1)
Total Bilirubin: 8.8 mg/dL — ABNORMAL HIGH (ref 0.3–1.2)

## 2015-10-17 LAB — RETICULOCYTES
RBC.: 2.37 MIL/uL — AB (ref 3.87–5.11)
RETIC CT PCT: 19.4 % — AB (ref 0.4–3.1)
Retic Count, Absolute: 459.8 10*3/uL — ABNORMAL HIGH (ref 19.0–186.0)

## 2015-10-17 MED ORDER — HYDROMORPHONE HCL 1 MG/ML IJ SOLN
1.0000 mg | INTRAMUSCULAR | Status: DC | PRN
  Administered 2015-10-17 (×2): 1 mg via INTRAVENOUS
  Filled 2015-10-17 (×2): qty 1

## 2015-10-17 MED ORDER — PROCHLORPERAZINE EDISYLATE 5 MG/ML IJ SOLN
10.0000 mg | Freq: Once | INTRAMUSCULAR | Status: AC
  Administered 2015-10-17: 10 mg via INTRAVENOUS
  Filled 2015-10-17: qty 2

## 2015-10-17 MED ORDER — PROMETHAZINE HCL 25 MG/ML IJ SOLN
25.0000 mg | Freq: Once | INTRAMUSCULAR | Status: AC
  Administered 2015-10-17: 25 mg via INTRAVENOUS
  Filled 2015-10-17: qty 1

## 2015-10-17 NOTE — Discharge Instructions (Signed)
Please continue your current medications. Maintain adequate hydration. Please follow-up with the sickle cell clinic and your physicians there. Please let them know of your breakthrough pain today, so that they may make it a part of your record. Please return to the emergency department if any changes, problems, or concerns. Sickle Cell Anemia, Adult Sickle cell anemia is a condition where your red blood cells are shaped like sickles. Red blood cells carry oxygen through the body. Sickle-shaped red blood cells do not live as long as normal red blood cells. They also clump together and block blood from flowing through the blood vessels. These things prevent the body from getting enough oxygen. Sickle cell anemia causes organ damage and pain. It also increases the risk of infection. HOME CARE  Drink enough fluid to keep your pee (urine) clear or pale yellow. Drink more in hot weather and during exercise.  Do not smoke. Smoking lowers oxygen levels in the blood.  Only take over-the-counter or prescription medicines as told by your doctor.  Take antibiotic medicines as told by your doctor. Make sure you finish them even if you start to feel better.  Take supplements as told by your doctor.  Consider wearing a medical alert bracelet. This tells anyone caring for you in an emergency of your condition.  When traveling, keep your medical information, doctors' names, and the medicines you take with you at all times.  If you have a fever, do not take fever medicines right away. This could cover up a problem. Tell your doctor.   Keep all follow-up visits with your doctor. Sickle cell anemia requires regular medical care. GET HELP IF: You have a fever. GET HELP RIGHT AWAY IF:  You feel dizzy or faint.  You have new belly (abdominal) pain, especially on the left side near the stomach area.  You have a lasting, often uncomfortable and painful erection of the penis (priapism). If it is not treated  right away, you will become unable to have sex (impotence).  You have numbness in your arms or legs or you have a hard time moving them.  You have a hard time talking.  You have a fever or lasting symptoms for more than 2-3 days.  You have a fever and your symptoms suddenly get worse.  You have signs or symptoms of infection. These include:  Chills.  Being more tired than normal (lethargy).  Irritability.  Poor eating.  Throwing up (vomiting).  You have pain that is not helped with medicine.  You have shortness of breath.  You have pain in your chest.  You are coughing up pus-like or bloody mucus.  You have a stiff neck.  Your feet or hands swell or have pain.  Your belly looks bloated.  Your joints hurt. MAKE SURE YOU:  Understand these instructions.  Will watch your condition.  Will get help right away if you are not doing well or get worse.   This information is not intended to replace advice given to you by your health care provider. Make sure you discuss any questions you have with your health care provider.   Document Released: 07/01/2013 Document Revised: 01/25/2015 Document Reviewed: 07/01/2013 Elsevier Interactive Patient Education [employer].

## 2015-10-17 NOTE — ED Notes (Signed)
Patient with no complaints at this time. Respirations even and unlabored. Skin warm/dry. Discharge instructions reviewed with patient at this time. Patient given opportunity to voice concerns/ask questions. IV removed per policy and band-aid applied to site. Patient discharged at this time and left Emergency Department with steady gait.  

## 2015-10-17 NOTE — ED Notes (Signed)
Patient with bilateral leg pain and low back pain that she attributes to sickle cell crisis. Restless, appears uncomfortable. IV established.

## 2015-10-17 NOTE — ED Notes (Signed)
Pt states pain to lower back and both legs began yesterday ~1300.  Pt states sickle cell pain.

## 2015-10-17 NOTE — ED Provider Notes (Signed)
CSN: [id number]     Arrival date & time 10/17/15  0751 History   First MD Initiated Contact with Patient 10/17/15 [phone]     Chief Complaint  Patient presents with  . Sickle Cell Pain Crisis     (Consider location/radiation/quality/duration/timing/severity/associated sxs/prior Treatment) HPI Comments: Patient is a 35 year old female who presents to the emergency department with a sickle cell crisis.  Patient has a history of sickle cell SS disease, left atrial dilatation, and B12 deficiency. She states that on yesterday she began to have a problem with both of her legs, later during the night she had pain in her legs and lower back. This continued on into the morning. She presents to the emergency department now for assistance with this pain the patient states that the symptoms are similar to previous sickle cell crisis pain. She has been attempting to control this with her medications at home, but has been unsuccessful up to this point. The patient denies any high fevers, congestion, difficulty breathing, shortness of breath, abdominal pain, wounds not healing, nor nausea vomiting.  Patient is a 35 y.o. female presenting with sickle cell pain. The history is provided by the patient.  Sickle Cell Pain Crisis Location:  Lower extremity and back Associated symptoms: no chest pain, no congestion, no cough, no fever, no nausea, no shortness of breath, no vomiting and no wheezing     Past Medical History  Diagnosis Date  . Sickle cell disease ([place])   . 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 Blood transfusion   . 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 HCAP (healthcare-associated pneumonia) 05/19/2013  . Sickle cell anemia ([facility])   . 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 Vitamin B12 deficiency 07/07/2015   Past Surgical History  Procedure Laterality Date  . Cholecystectomy    .  left knee acl reconstruction    . Cesarean section      x 2  . Portacath placement    . Portacath placement  Left 02/23/2013    Procedure: INSERTION PORT-A-CATH;  Surgeon: Donato Heinz, MD;  Location: [facility];  Service: General;  Laterality: Left;  . Port-a-cath removal Left 07/29/2013    Procedure: REMOVAL PORT-A-CATH;  Surgeon: Scherry Ran, MD;  Location: [facility];  Service: General;  Laterality: Left;  . Tee without cardioversion N/A 08/07/2013    Procedure: TRANSESOPHAGEAL ECHOCARDIOGRAM (TEE);  Surgeon: Arnoldo Lenis, MD;  Location: [facility];  Service: Cardiology;  Laterality: N/A;   Family History  Problem Relation Age of Onset  . Sickle cell trait Mother   . Sickle cell trait Father   . Hypertension Father   . Diabetes Father   . Sickle cell trait Sister   . Cancer Paternal Aunt     Breast   Social History  Substance Use Topics  . Smoking status: Never Smoker   . Smokeless tobacco: Former Systems developer  . Alcohol Use: No   OB History    Gravida Para Term Preterm AB TAB SAB Ectopic Multiple Living   1 1  1      1      Review of Systems  Constitutional: Positive for activity change. Negative for fever and chills.       All ROS Neg except as noted in HPI  HENT: Negative for congestion and nosebleeds.   Eyes: Negative for photophobia and discharge.  Respiratory: Negative for cough, shortness of breath and wheezing.   Cardiovascular: Negative for chest pain and palpitations.  Gastrointestinal: Negative  for nausea, vomiting, abdominal pain and blood in stool.  Genitourinary: Negative for dysuria, frequency and hematuria.  Musculoskeletal: Positive for arthralgias. Negative for back pain and neck pain.  Skin: Negative.   Neurological: Negative for dizziness, seizures and speech difficulty.  Psychiatric/Behavioral: Negative for hallucinations and confusion.      Allergies  Desferal; Latex; Lisinopril; and Tape  Home Medications   Prior to Admission medications   Medication Sig Start Date End Date Taking? Authorizing Provider  cetirizine (ZYRTEC) 10 MG tablet TAKE 1  TABLET (10 MG TOTAL) BY MOUTH DAILY. 05/06/15  Yes Historical Provider, MD  folic acid (FOLVITE) 1 MG tablet Take 1 tablet (1 mg total) by mouth daily. 08/16/15  Yes [name], FNP  hydroxyurea (HYDREA) 500 MG capsule Take 2 capsules (1,000 mg total) by mouth daily. May take with food to minimize GI side effects. 09/05/15  Yes [name], FNP  ibuprofen (ADVIL,MOTRIN) 200 MG tablet Take 400 mg by mouth every 6 (six) hours as needed for moderate pain.    Yes Historical Provider, MD  morphine (MS CONTIN) 15 MG 12 hr tablet Take 1 tablet (15 mg total) by mouth every 12 (twelve) hours. 10/06/15  Yes [name], FNP  Oxycodone HCl 10 MG TABS Take 1 tablet (10 mg total) by mouth every 6 (six) hours as needed. Patient taking differently: Take 10 mg by mouth every 6 (six) hours as needed (breakthrough pain).  10/06/15  Yes [name], FNP  promethazine (PHENERGAN) 12.5 MG tablet Take 1 tablet (12.5 mg total) by mouth every 6 (six) hours as needed for nausea. 08/31/15  Yes [name], MD  Vitamin D, Ergocalciferol, (DRISDOL) 50000 UNITS CAPS capsule Take 1 capsule (50,000 Units total) by mouth every 7 (seven) days. Takes on Sundays. 08/16/15  Yes [name], FNP   BP 148/90 mmHg  Pulse 99  Temp(Src) 98.2 F (36.8 C) (Oral)  Resp 18  Ht 5\' 3"  (1.6 m)  Wt 71.215 kg  BMI 27.82 kg/m2  SpO2 93% Physical Exam  Constitutional: She is oriented to person, place, and time. She appears well-developed and well-nourished.  Non-toxic appearance.  HENT:  Head: Normocephalic.  Right Ear: Tympanic membrane and external ear normal.  Left Ear: Tympanic membrane and external ear normal.  Eyes: EOM and lids are normal. Pupils are equal, round, and reactive to light.  Neck: Normal range of motion. Neck supple. Carotid bruit is not present.  Cardiovascular: Normal rate, regular rhythm, normal heart sounds, intact distal pulses and normal pulses.   Pulmonary/Chest: Breath sounds normal. No  respiratory distress. She has no wheezes. She has no rales.  Abdominal: Soft. Bowel sounds are normal. She exhibits no distension. There is no tenderness. There is no guarding.  Musculoskeletal: Normal range of motion.  No hot joints appreciated. Pain with attempted flexion/extension of the lower extremity. There is some mild tenderness to palpation.  There is pain with attempted range of motion of the lumbar area. No hot areas involving the bony areas of the lumbar or thoracic spine.  Lymphadenopathy:       Head (right side): No submandibular adenopathy present.       Head (left side): No submandibular adenopathy present.    She has no cervical adenopathy.  Neurological: She is alert and oriented to person, place, and time. She has normal strength. No cranial nerve deficit or sensory deficit.  Skin: Skin is warm and dry.  Psychiatric: She has a normal mood and affect.  Her speech is normal.  Nursing note and vitals reviewed.   ED Course  Procedures (including critical care time) Labs Review Labs Reviewed  COMPREHENSIVE METABOLIC PANEL - Abnormal; Notable for the following:    Glucose, Bld 101 (*)    Creatinine, Ser <0.30 (*)    Calcium 8.4 (*)    Albumin 3.1 (*)    AST 114 (*)    Alkaline Phosphatase 169 (*)    Total Bilirubin 8.8 (*)    All other components within normal limits  CBC WITH DIFFERENTIAL/PLATELET - Abnormal; Notable for the following:    RBC 2.36 (*)    Hemoglobin 8.3 (*)    HCT 23.0 (*)    MCH 35.2 (*)    MCHC 36.1 (*)    RDW 26.1 (*)    All other components within normal limits  URINALYSIS, ROUTINE W REFLEX MICROSCOPIC (NOT AT [facility]) - Abnormal; Notable for the following:    Color, Urine ORANGE (*)    Hgb urine dipstick LARGE (*)    Bilirubin Urine MODERATE (*)    Protein, ur 100 (*)    All other components within normal limits  RETICULOCYTES - Abnormal; Notable for the following:    Retic Ct Pct 19.4 (*)    RBC. 2.37 (*)    Retic Count, Manual 459.8 (*)     All other components within normal limits  URINE MICROSCOPIC-ADD ON - Abnormal; Notable for the following:    Squamous Epithelial / LPF 0-5 (*)    Bacteria, UA RARE (*)    All other components within normal limits    Imaging Review Dg Chest Portable 1 View  10/17/2015  CLINICAL DATA:  Sickle cell crisis. EXAM: PORTABLE CHEST 1 VIEW COMPARISON:  None. FINDINGS: Mediastinum and hilar structures normal. Cardiomegaly. No evidence of overt congestive heart failure. No focal infiltrate. No pleural effusion or pneumothorax. 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 IMPRESSION: 1. Cardiomegaly. No evidence of overt congestive heart failure. No focal pulmonary infiltrate. 2. 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   EKG Interpretation None      MDM  Patient presents to the emergency department with a sickle cell crisis. She states that the pain is very similar to her previous sickle cell crisis events. Review of the labs and x-ray does not suggest infection, or dehydration. The chest x-ray is negative for pneumonia or congestive failure or any other acute problem. The urine analysis does not show evidence of dehydration. There is 100 mg/daL of protein present. The competency metabolic panel shows electrolytes to be well within normal limits, with anion gap of 7. The BUN is 6, and the creatinine is less than 0.30, in no acute changes. The liver enzyme AST is elevated at 114, and the alkaline phosphatase is elevated at 169, these are within the range of her baseline numbers. The total bilirubin is elevated at 8.8, which is also within the range of her baseline values.   After oxygen, as well as IV pain medication and nausea medication, the patient feels that  she can manage her pain and discomfort at home. She states the pain has improved significantly. She will follow-up at the sickle cell clinic in [place], but will return to the emergency department immediately if any changes, problems, or  concerns. Feel that it is safe for the patient to be discharged home.    Final diagnoses:  None    *I have reviewed nursing notes, vital signs, and all appropriate lab and imaging results for this patient.[address] PA-C 10/17/15 Short, MD 10/17/15 1536

## 2015-10-21 ENCOUNTER — Encounter (HOSPITAL_COMMUNITY): Payer: Self-pay | Admitting: *Deleted

## 2015-10-21 ENCOUNTER — Emergency Department (HOSPITAL_COMMUNITY)
Admission: EM | Admit: 2015-10-21 | Discharge: 2015-10-22 | Disposition: A | Discharge status: Home / Self Care | Payer: BLUE CROSS/BLUE SHIELD | Attending: Emergency Medicine | Admitting: Emergency Medicine

## 2015-10-21 DIAGNOSIS — Z8679 Personal history of other diseases of the circulatory system: Secondary | ICD-10-CM | POA: Diagnosis not present

## 2015-10-21 DIAGNOSIS — Z8659 Personal history of other mental and behavioral disorders: Secondary | ICD-10-CM | POA: Insufficient documentation

## 2015-10-21 DIAGNOSIS — Z79899 Other long term (current) drug therapy: Secondary | ICD-10-CM | POA: Insufficient documentation

## 2015-10-21 DIAGNOSIS — Z91040 Latex allergy status: Secondary | ICD-10-CM | POA: Diagnosis not present

## 2015-10-21 DIAGNOSIS — E538 Deficiency of other specified B group vitamins: Secondary | ICD-10-CM | POA: Insufficient documentation

## 2015-10-21 DIAGNOSIS — D571 Sickle-cell disease without crisis: Secondary | ICD-10-CM | POA: Diagnosis not present

## 2015-10-21 DIAGNOSIS — Z8701 Personal history of pneumonia (recurrent): Secondary | ICD-10-CM | POA: Insufficient documentation

## 2015-10-21 DIAGNOSIS — D5700 Hb-SS disease with crisis, unspecified: Secondary | ICD-10-CM | POA: Diagnosis present

## 2015-10-21 DIAGNOSIS — D57219 Sickle-cell/Hb-C disease with crisis, unspecified: Secondary | ICD-10-CM | POA: Diagnosis not present

## 2015-10-21 MED ORDER — SODIUM CHLORIDE 0.45 % IV SOLN
INTRAVENOUS | Status: DC
  Administered 2015-10-22 (×2): via INTRAVENOUS

## 2015-10-21 NOTE — ED Notes (Signed)
Pt c/o pain in her legs and her back since earlier in the week. Pt reports not getting any relief from home meds.

## 2015-10-22 LAB — BASIC METABOLIC PANEL
Anion gap: 8 (ref 5–15)
BUN: 6 mg/dL (ref 6–20)
CHLORIDE: 106 mmol/L (ref 101–111)
CO2: 22 mmol/L (ref 22–32)
CREATININE: 0.41 mg/dL — AB (ref 0.44–1.00)
Calcium: 8.5 mg/dL — ABNORMAL LOW (ref 8.9–10.3)
GFR calc non Af Amer: >60 mL/min (ref >60)
Glucose, Bld: 91 mg/dL (ref 65–99)
POTASSIUM: 3.7 mmol/L (ref 3.5–5.1)
Sodium: 136 mmol/L (ref 135–145)

## 2015-10-22 LAB — CBC WITH DIFFERENTIAL/PLATELET
BASOS ABS: 0 10*3/uL (ref 0.0–0.1)
BASOS PCT: 0 %
Eosinophils Absolute: 0.1 10*3/uL (ref 0.0–0.7)
Eosinophils Relative: 1 %
HEMATOCRIT: 22.2 % — AB (ref 36.0–46.0)
HEMOGLOBIN: 8.1 g/dL — AB (ref 12.0–15.0)
LYMPHS PCT: 32 %
Lymphs Abs: 2.7 10*3/uL (ref 0.7–4.0)
MCH: 35.2 pg — ABNORMAL HIGH (ref 26.0–34.0)
MCHC: 36.5 g/dL — AB (ref 30.0–36.0)
MCV: 96.5 fL (ref 78.0–100.0)
MONOS PCT: 17 %
Monocytes Absolute: 1.4 10*3/uL — ABNORMAL HIGH (ref 0.1–1.0)
NEUTROS ABS: 4.1 10*3/uL (ref 1.7–7.7)
NEUTROS PCT: 50 %
Platelets: 147 10*3/uL — ABNORMAL LOW (ref 150–400)
RBC: 2.3 MIL/uL — ABNORMAL LOW (ref 3.87–5.11)
RDW: 24.9 % — ABNORMAL HIGH (ref 11.5–15.5)
WBC: 8.3 10*3/uL (ref 4.0–10.5)

## 2015-10-22 LAB — RETICULOCYTES
RBC.: 2.3 MIL/uL — ABNORMAL LOW (ref 3.87–5.11)
Retic Count, Absolute: 439.3 10*3/uL — ABNORMAL HIGH (ref 19.0–186.0)
Retic Ct Pct: 19.1 % — ABNORMAL HIGH (ref 0.4–3.1)

## 2015-10-22 MED ORDER — SODIUM CHLORIDE 0.45 % IV SOLN
INTRAVENOUS | Status: DC
  Administered 2015-10-22: 03:00:00 via INTRAVENOUS

## 2015-10-22 MED ORDER — HYDROMORPHONE HCL 2 MG/ML IJ SOLN
2.0000 mg | Freq: Once | INTRAMUSCULAR | Status: AC
  Administered 2015-10-22: 2 mg via INTRAVENOUS
  Filled 2015-10-22: qty 1

## 2015-10-22 MED ORDER — PROMETHAZINE HCL 25 MG/ML IJ SOLN
25.0000 mg | Freq: Once | INTRAMUSCULAR | Status: AC
  Administered 2015-10-22: 25 mg via INTRAVENOUS
  Filled 2015-10-22: qty 1

## 2015-10-22 MED ORDER — HYDROMORPHONE HCL 1 MG/ML IJ SOLN
1.0000 mg | Freq: Once | INTRAMUSCULAR | Status: AC
  Administered 2015-10-22: 1 mg via INTRAVENOUS
  Filled 2015-10-22: qty 1

## 2015-10-22 MED ORDER — KETOROLAC TROMETHAMINE 30 MG/ML IJ SOLN
30.0000 mg | Freq: Once | INTRAMUSCULAR | Status: AC
  Administered 2015-10-22: 30 mg via INTRAVENOUS
  Filled 2015-10-22: qty 1

## 2015-10-22 NOTE — Discharge Instructions (Signed)
Sickle Cell Anemia, Adult Sickle cell anemia is a condition in which red blood cells have an abnormal "sickle" shape. This abnormal shape shortens the cells' life span, which results in a lower than normal concentration of red blood cells in the blood. The sickle shape also causes the cells to clump together and block free blood flow through the blood vessels. As a result, the tissues and organs of the body do not receive enough oxygen. Sickle cell anemia causes organ damage and pain and increases the risk of infection. CAUSES  Sickle cell anemia is a genetic disorder. Those who receive two copies of the gene have the condition, and those who receive one copy have the trait. RISK FACTORS The sickle cell gene is most common in people whose families originated in [place]. Other areas of the globe where sickle cell trait occurs include the [place], [place] and [place], the [place], and the [place].  SIGNS AND SYMPTOMS  Pain, especially in the extremities, back, chest, or abdomen (common). The pain may start suddenly or may develop following an illness, especially if there is dehydration. Pain can also occur due to overexertion or exposure to extreme temperature changes.  Frequent severe bacterial infections, especially certain types of pneumonia and meningitis.  Pain and swelling in the hands and feet.  Decreased activity.   Loss of appetite.   Change in behavior.  Headaches.  Seizures.  Shortness of breath or difficulty breathing.  Vision changes.  Skin ulcers. Those with the trait may not have symptoms or they may have mild symptoms.  DIAGNOSIS  Sickle cell anemia is diagnosed with blood tests that demonstrate the genetic trait. It is often diagnosed during the newborn period, due to mandatory testing nationwide. A variety of blood tests, X-rays, CT scans, MRI scans, ultrasounds, and lung function tests may also be done to monitor the condition. TREATMENT  Sickle  cell anemia may be treated with:  Medicines. You may be given pain medicines, antibiotic medicines (to treat and prevent infections) or medicines to increase the production of certain types of hemoglobin.  Fluids.  Oxygen.  Blood transfusions. HOME CARE INSTRUCTIONS   Drink enough fluid to keep your urine clear or pale yellow. Increase your fluid intake in hot weather and during exercise.  Do not smoke. Smoking lowers oxygen levels in the blood.   Only take over-the-counter or prescription medicines for pain, fever, or discomfort as directed by your health care provider.  Take antibiotics as directed by your health care provider. Make sure you finish them it even if you start to feel better.   Take supplements as directed by your health care provider.   Consider wearing a medical alert bracelet. This tells anyone caring for you in an emergency of your condition.   When traveling, keep your medical information, health care provider's names, and the medicines you take with you at all times.   If you develop a fever, do not take medicines to reduce the fever right away. This could cover up a problem that is developing. Notify your health care provider.  Keep all follow-up appointments with your health care provider. Sickle cell anemia requires regular medical care. SEEK MEDICAL CARE IF: You have a fever. SEEK IMMEDIATE MEDICAL CARE IF:   You feel dizzy or faint.   You have new abdominal pain, especially on the left side near the stomach area.   You develop a persistent, often uncomfortable and painful penile erection (priapism). If this is not treated immediately it   will lead to impotence.   You have numbness your arms or legs or you have a hard time moving them.   You have a hard time with speech.   You have a fever or persistent symptoms for more than 2-3 days.   You have a fever and your symptoms suddenly get worse.   You have signs or symptoms of infection.  These include:   Chills.   Abnormal tiredness (lethargy).   Irritability.   Poor eating.   Vomiting.   You develop pain that is not helped with medicine.   You develop shortness of breath.  You have pain in your chest.   You are coughing up pus-like or bloody sputum.   You develop a stiff neck.  Your feet or hands swell or have pain.  Your abdomen appears bloated.  You develop joint pain. MAKE SURE YOU:  Understand these instructions.   This information is not intended to replace advice given to you by your health care provider. Make sure you discuss any questions you have with your health care provider.   Document Released: 12/19/2005 Document Revised: 10/01/2014 Document Reviewed: 04/22/2013 Elsevier Interactive Patient Education 2016 [employer]  

## 2015-10-22 NOTE — ED Provider Notes (Signed)
CSN: [id number]     Arrival date & time 10/21/15  2151 History   First MD Initiated Contact with Patient 10/21/15 2316     Chief Complaint  Patient presents with  . Sickle Cell Pain Crisis     (Consider location/radiation/quality/duration/timing/severity/associated sxs/prior Treatment) Patient is a 35 y.o. female presenting with sickle cell pain. The history is provided by the patient.  Sickle Cell Pain Crisis Location:  Back, upper extremity and lower extremity Severity:  Severe Onset quality:  Gradual Duration:  6 days Similar to previous crisis episodes: yes   Timing:  Constant Progression:  Worsening Chronicity:  Recurrent Sickle cell genotype:  SS Usual hemoglobin level:  8 History of pulmonary emboli: no   Context: cold exposure   Context: not infection   Context comment:  She suspects this may be due to cold weather Relieved by:  Nothing Worsened by:  Nothing tried Ineffective treatments:  Prescription drugs Associated symptoms: no chest pain, no congestion, no cough, no fatigue, no fever, no headaches, no nausea, no shortness of breath, no sore throat, no swelling of legs and no vomiting   Risk factors: cholecystectomy and frequent pain crises   Risk factors: no frequent admissions for fever, no renal disease and not smoking     Past Medical History  Diagnosis Date  . Sickle cell disease ([place])   . 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 Blood transfusion   . 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 HCAP (healthcare-associated pneumonia) 05/19/2013  . Sickle cell anemia ([facility])   . 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 Vitamin B12 deficiency 07/07/2015   Past Surgical History  Procedure Laterality Date  . Cholecystectomy    .  left knee acl reconstruction    . Cesarean section      x 2  . Portacath placement    . Portacath placement Left 02/23/2013    Procedure: INSERTION PORT-A-CATH;  Surgeon: Donato Heinz, MD;  Location: [facility];  Service: General;   Laterality: Left;  . Port-a-cath removal Left 07/29/2013    Procedure: REMOVAL PORT-A-CATH;  Surgeon: Scherry Ran, MD;  Location: [facility];  Service: General;  Laterality: Left;  . Tee without cardioversion N/A 08/07/2013    Procedure: TRANSESOPHAGEAL ECHOCARDIOGRAM (TEE);  Surgeon: Arnoldo Lenis, MD;  Location: [facility];  Service: Cardiology;  Laterality: N/A;   Family History  Problem Relation Age of Onset  . Sickle cell trait Mother   . Sickle cell trait Father   . Hypertension Father   . Diabetes Father   . Sickle cell trait Sister   . Cancer Paternal Aunt     Breast   Social History  Substance Use Topics  . Smoking status: Never Smoker   . Smokeless tobacco: Former Systems developer  . Alcohol Use: No   OB History    Gravida Para Term Preterm AB TAB SAB Ectopic Multiple Living   1 1  1      1      Review of Systems  Constitutional: Negative for fever and fatigue.  HENT: Negative for congestion and sore throat.   Eyes: Negative.   Respiratory: Negative for cough, chest tightness and shortness of breath.   Cardiovascular: Negative for chest pain.  Gastrointestinal: Negative for nausea, vomiting and abdominal pain.  Genitourinary: Negative.   Musculoskeletal: Positive for back pain and arthralgias. Negative for joint swelling and neck pain.  Skin: Negative.  Negative for rash and wound.  Neurological: Negative for dizziness, weakness, light-headedness,  numbness and headaches.  Psychiatric/Behavioral: Negative.       Allergies  Desferal; Latex; Lisinopril; and Tape  Home Medications   Prior to Admission medications   Medication Sig Start Date End Date Taking? Authorizing Provider  cetirizine (ZYRTEC) 10 MG tablet TAKE 1 TABLET (10 MG TOTAL) BY MOUTH DAILY. 05/06/15  Yes Historical Provider, MD  folic acid (FOLVITE) 1 MG tablet Take 1 tablet (1 mg total) by mouth daily. 08/16/15  Yes [name], FNP  hydroxyurea (HYDREA) 500 MG capsule Take 2 capsules (1,000 mg  total) by mouth daily. May take with food to minimize GI side effects. 09/05/15  Yes [name], FNP  ibuprofen (ADVIL,MOTRIN) 200 MG tablet Take 400 mg by mouth every 6 (six) hours as needed for moderate pain.    Yes Historical Provider, MD  morphine (MS CONTIN) 15 MG 12 hr tablet Take 1 tablet (15 mg total) by mouth every 12 (twelve) hours. 10/06/15  Yes [name], FNP  Oxycodone HCl 10 MG TABS Take 1 tablet (10 mg total) by mouth every 6 (six) hours as needed. Patient taking differently: Take 10 mg by mouth every 6 (six) hours as needed (breakthrough pain).  10/06/15  Yes [name], FNP  promethazine (PHENERGAN) 12.5 MG tablet Take 1 tablet (12.5 mg total) by mouth every 6 (six) hours as needed for nausea. 08/31/15  Yes [name], MD  Vitamin D, Ergocalciferol, (DRISDOL) 50000 UNITS CAPS capsule Take 1 capsule (50,000 Units total) by mouth every 7 (seven) days. Takes on Sundays. 08/16/15  Yes [name], FNP   BP 132/84 mmHg  Pulse 95  Temp(Src) 99.4 F (37.4 C) (Oral)  Resp 17  Ht 5\' 5"  (1.651 m)  Wt 71.215 kg  BMI 26.13 kg/m2  SpO2 96% Physical Exam  Constitutional: She appears well-developed and well-nourished.  HENT:  Head: Normocephalic.  Eyes: Conjunctivae are normal.  Neck: Normal range of motion. Neck supple.  Cardiovascular: Normal rate and intact distal pulses.   Pulses:      Radial pulses are 2+ on the right side, and 2+ on the left side.       Dorsalis pedis pulses are 2+ on the right side, and 2+ on the left side.  Pedal pulses normal.  Pulmonary/Chest: Effort normal.  Abdominal: Soft. Bowel sounds are normal. She exhibits no distension and no mass.  Musculoskeletal: Normal range of motion. She exhibits no edema.       Lumbar back: She exhibits tenderness. She exhibits no swelling, no edema and no spasm.  ttp bilateral thighs and forearms. No edema, no erythema.    Neurological: She is alert. She has normal strength. She displays no atrophy  and no tremor. No sensory deficit. Gait normal.  Reflex Scores:      Patellar reflexes are 2+ on the right side and 2+ on the left side.      Achilles reflexes are 2+ on the right side and 2+ on the left side. No strength deficit noted in hip and knee flexor and extensor muscle groups.  Ankle flexion and extension intact.  Skin: Skin is warm and dry.  Psychiatric: She has a normal mood and affect.  Nursing note and vitals reviewed.   ED Course  Procedures (including critical care time) Labs Review Labs Reviewed  RETICULOCYTES - Abnormal; Notable for the following:    Retic Ct Pct 19.1 (*)    RBC. 2.30 (*)    Retic Count, Manual 439.3 (*)  All other components within normal limits  CBC WITH DIFFERENTIAL/PLATELET - Abnormal; Notable for the following:    RBC 2.30 (*)    Hemoglobin 8.1 (*)    HCT 22.2 (*)    MCH 35.2 (*)    MCHC 36.5 (*)    RDW 24.9 (*)    Platelets 147 (*)    Monocytes Absolute 1.4 (*)    All other components within normal limits  BASIC METABOLIC PANEL - Abnormal; Notable for the following:    Creatinine, Ser 0.41 (*)    Calcium 8.5 (*)    All other components within normal limits  POC URINE PREG, ED    Imaging Review No results found. I have personally reviewed and evaluated these images and lab results as part of my medical decision-making.   EKG Interpretation None      MDM   Final diagnoses:  None    Pt given 1/2 NS along with dilaudid 2 mg/phenergan 25 mg IV.  Pain 8/10 from 10/10. Will repeat second liter bolus, added dilaudid 1 mg IV,  toradol 30 IV.  Labs reviewed, hgb stable at 8.1.  Adequate retic count response.  WBC normal.   Pending better pain control before dc home.  Discussed with Dr Tomi Bamberger who will monitor and dispo home when patient is improved.    Evalee Jefferson, PA-C 10/22/15 0148  Dorie Rank, MD 10/22/15 [phone]

## 2015-10-22 NOTE — ED Provider Notes (Signed)
Medical screening examination/treatment/procedure(s) were conducted as a shared visit with non-physician practitioner(s) and myself.  I personally evaluated the patient during the encounter.  2:58 AM Pt feels like her symptoms are getting better.  She would like to be able to go home.  Will give a third dose of pain medications.  [id number]  Feeling better.  Pain is not completely resolved but is manageable.  Pt would like to go home  Dorie Rank, MD 10/22/15 [phone]

## 2015-10-25 ENCOUNTER — Inpatient Hospital Stay (HOSPITAL_COMMUNITY)
Admission: EM | Admit: 2015-10-25 | Discharge: 2015-11-04 | DRG: 812 | Disposition: A | Discharge status: Home / Self Care | Payer: BLUE CROSS/BLUE SHIELD | Attending: Internal Medicine | Admitting: Internal Medicine

## 2015-10-25 ENCOUNTER — Emergency Department (HOSPITAL_COMMUNITY): Payer: BLUE CROSS/BLUE SHIELD

## 2015-10-25 ENCOUNTER — Encounter (HOSPITAL_COMMUNITY): Payer: Self-pay

## 2015-10-25 ENCOUNTER — Telehealth (HOSPITAL_COMMUNITY): Payer: Self-pay | Admitting: Hematology

## 2015-10-25 DIAGNOSIS — D638 Anemia in other chronic diseases classified elsewhere: Secondary | ICD-10-CM | POA: Insufficient documentation

## 2015-10-25 DIAGNOSIS — M7989 Other specified soft tissue disorders: Secondary | ICD-10-CM

## 2015-10-25 DIAGNOSIS — N390 Urinary tract infection, site not specified: Secondary | ICD-10-CM | POA: Diagnosis present

## 2015-10-25 DIAGNOSIS — D599 Acquired hemolytic anemia, unspecified: Secondary | ICD-10-CM | POA: Diagnosis present

## 2015-10-25 DIAGNOSIS — D571 Sickle-cell disease without crisis: Secondary | ICD-10-CM | POA: Diagnosis present

## 2015-10-25 DIAGNOSIS — R000 Tachycardia, unspecified: Secondary | ICD-10-CM | POA: Insufficient documentation

## 2015-10-25 DIAGNOSIS — M79601 Pain in right arm: Secondary | ICD-10-CM

## 2015-10-25 DIAGNOSIS — D5700 Hb-SS disease with crisis, unspecified: Principal | ICD-10-CM

## 2015-10-25 DIAGNOSIS — Z8249 Family history of ischemic heart disease and other diseases of the circulatory system: Secondary | ICD-10-CM

## 2015-10-25 DIAGNOSIS — F329 Major depressive disorder, single episode, unspecified: Secondary | ICD-10-CM | POA: Diagnosis not present

## 2015-10-25 DIAGNOSIS — Z833 Family history of diabetes mellitus: Secondary | ICD-10-CM

## 2015-10-25 DIAGNOSIS — R0602 Shortness of breath: Secondary | ICD-10-CM | POA: Diagnosis not present

## 2015-10-25 DIAGNOSIS — E869 Volume depletion, unspecified: Secondary | ICD-10-CM | POA: Diagnosis present

## 2015-10-25 LAB — COMPREHENSIVE METABOLIC PANEL
ALT: 32 U/L (ref 14–54)
AST: 89 U/L — ABNORMAL HIGH (ref 15–41)
Albumin: 3 g/dL — ABNORMAL LOW (ref 3.5–5.0)
Alkaline Phosphatase: 161 U/L — ABNORMAL HIGH (ref 38–126)
Anion gap: 8 (ref 5–15)
BILIRUBIN TOTAL: 9.3 mg/dL — AB (ref 0.3–1.2)
BUN: 7 mg/dL (ref 6–20)
CHLORIDE: 105 mmol/L (ref 101–111)
CO2: 23 mmol/L (ref 22–32)
CREATININE: 0.47 mg/dL (ref 0.44–1.00)
Calcium: 8.6 mg/dL — ABNORMAL LOW (ref 8.9–10.3)
Glucose, Bld: 121 mg/dL — ABNORMAL HIGH (ref 65–99)
POTASSIUM: 3.6 mmol/L (ref 3.5–5.1)
Sodium: 136 mmol/L (ref 135–145)
TOTAL PROTEIN: 7.9 g/dL (ref 6.5–8.1)

## 2015-10-25 LAB — CBC WITH DIFFERENTIAL/PLATELET
BASOS ABS: 0 10*3/uL (ref 0.0–0.1)
Basophils Relative: 0 %
EOS PCT: 0 %
Eosinophils Absolute: 0 10*3/uL (ref 0.0–0.7)
HCT: 21.9 % — ABNORMAL LOW (ref 36.0–46.0)
Hemoglobin: 7.9 g/dL — ABNORMAL LOW (ref 12.0–15.0)
LYMPHS ABS: 2.6 10*3/uL (ref 0.7–4.0)
Lymphocytes Relative: 33 %
MCH: 35.1 pg — ABNORMAL HIGH (ref 26.0–34.0)
MCHC: 36.1 g/dL — ABNORMAL HIGH (ref 30.0–36.0)
MCV: 97.3 fL (ref 78.0–100.0)
MONO ABS: 1.1 10*3/uL — AB (ref 0.1–1.0)
MONOS PCT: 14 %
NEUTROS PCT: 53 %
Neutro Abs: 4.3 10*3/uL (ref 1.7–7.7)
PLATELETS: ADEQUATE 10*3/uL (ref 150–400)
RBC: 2.25 MIL/uL — AB (ref 3.87–5.11)
RDW: 28.3 % — AB (ref 11.5–15.5)
WBC: 8 10*3/uL (ref 4.0–10.5)

## 2015-10-25 LAB — RETICULOCYTES: RBC.: 2.25 MIL/uL — AB (ref 3.87–5.11)

## 2015-10-25 LAB — I-STAT BETA HCG BLOOD, ED (MC, WL, AP ONLY): I-stat hCG, quantitative: <5 m[IU]/mL (ref <5)

## 2015-10-25 MED ORDER — ENOXAPARIN SODIUM 40 MG/0.4ML ~~LOC~~ SOLN
40.0000 mg | SUBCUTANEOUS | Status: DC
  Administered 2015-10-25 – 2015-11-01 (×8): 40 mg via SUBCUTANEOUS
  Filled 2015-10-25 (×9): qty 0.4

## 2015-10-25 MED ORDER — FOLIC ACID 1 MG PO TABS
1.0000 mg | ORAL_TABLET | Freq: Every day | ORAL | Status: DC
  Administered 2015-10-25 – 2015-11-04 (×11): 1 mg via ORAL
  Filled 2015-10-25 (×11): qty 1

## 2015-10-25 MED ORDER — LORATADINE 10 MG PO TABS
10.0000 mg | ORAL_TABLET | Freq: Every day | ORAL | Status: DC
  Administered 2015-10-26 – 2015-11-04 (×10): 10 mg via ORAL
  Filled 2015-10-25 (×11): qty 1

## 2015-10-25 MED ORDER — HYDROMORPHONE HCL 2 MG/ML IJ SOLN
2.0000 mg | INTRAMUSCULAR | Status: AC | PRN
  Administered 2015-10-25 (×3): 2 mg via INTRAVENOUS
  Filled 2015-10-25 (×3): qty 1

## 2015-10-25 MED ORDER — PROMETHAZINE HCL 25 MG/ML IJ SOLN
12.5000 mg | Freq: Once | INTRAMUSCULAR | Status: AC
  Administered 2015-10-25: 12.5 mg via INTRAVENOUS
  Filled 2015-10-25: qty 1

## 2015-10-25 MED ORDER — ADULT MULTIVITAMIN W/MINERALS CH
1.0000 | ORAL_TABLET | Freq: Every day | ORAL | Status: DC
  Administered 2015-10-25 – 2015-11-04 (×11): 1 via ORAL
  Filled 2015-10-25 (×11): qty 1

## 2015-10-25 MED ORDER — HYDROMORPHONE 1 MG/ML IV SOLN
INTRAVENOUS | Status: DC
  Administered 2015-10-25: 22:00:00 via INTRAVENOUS
  Administered 2015-10-26: 2.8 mg via INTRAVENOUS
  Administered 2015-10-26: 09:00:00 via INTRAVENOUS
  Administered 2015-10-26: 6.9 mg via INTRAVENOUS
  Administered 2015-10-26: 9.1 mg via INTRAVENOUS
  Filled 2015-10-25 (×4): qty 25

## 2015-10-25 MED ORDER — ONDANSETRON HCL 4 MG/2ML IJ SOLN
4.0000 mg | Freq: Once | INTRAMUSCULAR | Status: AC
  Administered 2015-10-25: 4 mg via INTRAVENOUS
  Filled 2015-10-25: qty 2

## 2015-10-25 MED ORDER — DIPHENHYDRAMINE HCL 12.5 MG/5ML PO ELIX: 12.5000 mg | ORAL_SOLUTION | Freq: Four times a day (QID) | ORAL | Status: DC | PRN

## 2015-10-25 MED ORDER — NALOXONE HCL 0.4 MG/ML IJ SOLN: 0.4000 mg | INTRAMUSCULAR | Status: DC | PRN

## 2015-10-25 MED ORDER — SODIUM CHLORIDE 0.9 % IV SOLN
12.5000 mg | Freq: Four times a day (QID) | INTRAVENOUS | Status: DC | PRN
  Filled 2015-10-25: qty 0.25

## 2015-10-25 MED ORDER — HYDROMORPHONE HCL 2 MG/ML IJ SOLN
2.5000 mg | Freq: Once | INTRAMUSCULAR | Status: AC
Start: 2015-10-25 — End: 2015-10-25
  Administered 2015-10-25: 2.5 mg via INTRAVENOUS
  Filled 2015-10-25: qty 2

## 2015-10-25 MED ORDER — PROMETHAZINE HCL 25 MG PO TABS
12.5000 mg | ORAL_TABLET | Freq: Four times a day (QID) | ORAL | Status: DC | PRN
  Administered 2015-10-27 – 2015-11-02 (×2): 12.5 mg via ORAL
  Filled 2015-10-25 (×3): qty 1

## 2015-10-25 MED ORDER — ONDANSETRON HCL 4 MG/2ML IJ SOLN
4.0000 mg | Freq: Four times a day (QID) | INTRAMUSCULAR | Status: DC | PRN
  Administered 2015-10-26 – 2015-10-27 (×3): 4 mg via INTRAVENOUS
  Filled 2015-10-25 (×3): qty 2

## 2015-10-25 MED ORDER — MORPHINE SULFATE ER 15 MG PO TBCR
15.0000 mg | EXTENDED_RELEASE_TABLET | Freq: Two times a day (BID) | ORAL | Status: DC
  Administered 2015-10-25 – 2015-11-04 (×20): 15 mg via ORAL
  Filled 2015-10-25 (×20): qty 1

## 2015-10-25 MED ORDER — KETOROLAC TROMETHAMINE 30 MG/ML IJ SOLN
30.0000 mg | Freq: Four times a day (QID) | INTRAMUSCULAR | Status: DC
  Administered 2015-10-25 – 2015-10-30 (×19): 30 mg via INTRAVENOUS
  Filled 2015-10-25 (×19): qty 1

## 2015-10-25 MED ORDER — HYDROXYUREA 500 MG PO CAPS
1000.0000 mg | ORAL_CAPSULE | Freq: Every day | ORAL | Status: DC
  Administered 2015-10-26 – 2015-10-27 (×2): 1000 mg via ORAL
  Filled 2015-10-25 (×2): qty 2

## 2015-10-25 MED ORDER — POLYETHYLENE GLYCOL 3350 17 G PO PACK
17.0000 g | PACK | Freq: Every day | ORAL | Status: DC | PRN
  Administered 2015-10-31: 17 g via ORAL
  Filled 2015-10-25 (×2): qty 1

## 2015-10-25 MED ORDER — DEXTROSE-NACL 5-0.45 % IV SOLN
INTRAVENOUS | Status: AC
  Administered 2015-10-26: via INTRAVENOUS

## 2015-10-25 MED ORDER — SENNOSIDES-DOCUSATE SODIUM 8.6-50 MG PO TABS
1.0000 | ORAL_TABLET | Freq: Two times a day (BID) | ORAL | Status: DC
  Administered 2015-10-25 – 2015-11-04 (×20): 1 via ORAL
  Filled 2015-10-25 (×20): qty 1

## 2015-10-25 MED ORDER — SODIUM CHLORIDE 0.9% FLUSH: 9.0000 mL | INTRAVENOUS | Status: DC | PRN

## 2015-10-25 MED ORDER — IBUPROFEN 200 MG PO TABS: 400.0000 mg | ORAL_TABLET | Freq: Four times a day (QID) | ORAL | Status: DC | PRN

## 2015-10-25 NOTE — H&P (Addendum)
[facility] History and Physical  Dawn [name] [id number] DOB: June 16, 1981 DOA: 10/25/2015  Referring physician: EDP PCP: Angelica Chessman, MD   Chief Complaint: Sickle cell pain crisis   HPI: Dawn [name] is a 35 y.o. female with h/o HGB SS disease, patient presents to ED with pain crisis. Pain located in low back, BLE, BUE.  9/10 intensity, typical for pain crisis.  Taking MS contin and oxycodone without relief of pain.  No CP, no SOB, no cough, fever, chills, N/V.  Review of Systems: Systems reviewed.  As above, otherwise negative  Past Medical History  Diagnosis Date  . Sickle cell disease ([place])   . 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 Blood transfusion   . 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 HCAP (healthcare-associated pneumonia) 05/19/2013  . Sickle cell anemia ([facility])   . 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 Vitamin B12 deficiency 07/07/2015   Past Surgical History  Procedure Laterality Date  . Cholecystectomy    .  left knee acl reconstruction    . Cesarean section      x 2  . Portacath placement    . Portacath placement Left 02/23/2013    Procedure: INSERTION PORT-A-CATH;  Surgeon: Donato Heinz, MD;  Location: [facility];  Service: General;  Laterality: Left;  . Port-a-cath removal Left 07/29/2013    Procedure: REMOVAL PORT-A-CATH;  Surgeon: Scherry Ran, MD;  Location: [facility];  Service: General;  Laterality: Left;  . Tee without cardioversion N/A 08/07/2013    Procedure: TRANSESOPHAGEAL ECHOCARDIOGRAM (TEE);  Surgeon: Arnoldo Lenis, MD;  Location[facility];  Service: Cardiology;  Laterality: N/A;   Social History:  reports that she has never smoked. She has quit using smokeless tobacco. She reports that she does not drink alcohol or use illicit drugs.  Allergies  Allergen Reactions  . Desferal [Deferoxamine] Hives    Local reaction on arm only during infusion. Can take with benadryl   . Latex Other (See Comments)    REACTION: Pt  experiences a burning sensation on contacted skin areas  . Lisinopril Other (See Comments) and Cough    REACTION: Sore/scratchy throat  . Tape Other (See Comments)    REACTION: Pt. Experiences a burning sensation on contacted skin areas    Family History  Problem Relation Age of Onset  . Sickle cell trait Mother   . Sickle cell trait Father   . Hypertension Father   . Diabetes Father   . Sickle cell trait Sister   . Cancer Paternal Aunt     Breast     Prior to Admission medications   Medication Sig Start Date End Date Taking? Authorizing Provider  folic acid (FOLVITE) 1 MG tablet Take 1 tablet (1 mg total) by mouth daily. 08/16/15  Yes [name], FNP  hydroxyurea (HYDREA) 500 MG capsule Take 2 capsules (1,000 mg total) by mouth daily. May take with food to minimize GI side effects. 09/05/15  Yes [name], FNP  ibuprofen (ADVIL,MOTRIN) 200 MG tablet Take 400 mg by mouth every 6 (six) hours as needed for moderate pain.    Yes Historical Provider, MD  loratadine (CLARITIN) 10 MG tablet Take 10 mg by mouth daily.   Yes Historical Provider, MD  morphine (MS CONTIN) 15 MG 12 hr tablet Take 1 tablet (15 mg total) by mouth every 12 (twelve) hours. 10/06/15  Yes [name], FNP  Multiple Vitamin (MULTIVITAMIN WITH MINERALS) TABS tablet Take 1 tablet  by mouth daily.   Yes Historical Provider, MD  Oxycodone HCl 10 MG TABS Take 1 tablet (10 mg total) by mouth every 6 (six) hours as needed. Patient taking differently: Take 10 mg by mouth every 6 (six) hours as needed (breakthrough pain).  10/06/15  Yes [name], FNP  promethazine (PHENERGAN) 12.5 MG tablet Take 1 tablet (12.5 mg total) by mouth every 6 (six) hours as needed for nausea. 08/31/15  Yes [name], MD  Vitamin D, Ergocalciferol, (DRISDOL) 50000 UNITS CAPS capsule Take 1 capsule (50,000 Units total) by mouth every 7 (seven) days. Takes on Sundays. 08/16/15  Yes [name], FNP   Physical Exam: Filed  Vitals:   10/25/15 1616 10/25/15 1834  BP: 148/91 145/97  Pulse: 99 91  Temp:    Resp: 17 18    BP 145/97 mmHg  Pulse 91  Temp(Src) 98.8 F (37.1 C) (Oral)  Resp 18  Ht 5\' 3"  (1.6 m)  Wt 71.215 kg (157 lb)  BMI 27.82 kg/m2  SpO2 95%  General Appearance:    Alert, oriented, no distress, appears stated age  Head:    Normocephalic, atraumatic  Eyes:    PERRL, EOMI, sclera non-icteric        Nose:   Nares without drainage or epistaxis. Mucosa, turbinates normal  Throat:   Moist mucous membranes. Oropharynx without erythema or exudate.  Neck:   Supple. No carotid bruits.  No thyromegaly.  No lymphadenopathy.   Back:     No CVA tenderness, no spinal tenderness  Lungs:     Clear to auscultation bilaterally, without wheezes, rhonchi or rales  Chest wall:    No tenderness to palpitation  Heart:    Regular rate and rhythm without murmurs, gallops, rubs  Abdomen:     Soft, non-tender, nondistended, normal bowel sounds, no organomegaly  Genitalia:    deferred  Rectal:    deferred  Extremities:   No clubbing, cyanosis or edema.  Pulses:   2+ and symmetric all extremities  Skin:   Skin color, texture, turgor normal, no rashes or lesions  Lymph nodes:   Cervical, supraclavicular, and axillary nodes normal  Neurologic:   CNII-XII intact. Normal strength, sensation and reflexes      throughout    Labs on Admission:  Basic Metabolic Panel:  Recent Labs Lab 10/22/15 0030 10/25/15 1531  NA 136 136  K 3.7 3.6  CL 106 105  CO2 22 23  GLUCOSE 91 121*  BUN 6 7  CREATININE 0.41* 0.47  CALCIUM 8.5* 8.6*   Liver Function Tests:  Recent Labs Lab 10/25/15 1531  AST 89*  ALT 32  ALKPHOS 161*  BILITOT 9.3*  PROT 7.9  ALBUMIN 3.0*   No results for input(s): LIPASE, AMYLASE in the last 168 hours. No results for input(s): AMMONIA in the last 168 hours. CBC:  Recent Labs Lab 10/22/15 0030 10/25/15 1531  WBC 8.3 8.0  NEUTROABS 4.1 4.3  HGB 8.1* 7.9*  HCT 22.2* 21.9*  MCV  96.5 97.3  PLT 147* PLATELET CLUMPS NOTED ON SMEAR, COUNT APPEARS ADEQUATE   Cardiac Enzymes: No results for input(s): CKTOTAL, CKMB, CKMBINDEX, TROPONINI in the last 168 hours.  BNP (last 3 results) No results for input(s): PROBNP in the last 8760 hours. CBG: No results for input(s): GLUCAP in the last 168 hours.  Radiological Exams on Admission: Dg Chest Port 1 View  10/25/2015  CLINICAL DATA:  Sickle cell pain.  Shortness of breath. EXAM: PORTABLE CHEST  1 VIEW COMPARISON:  Multiple previous chest x-rays. The most recent is 10/17/2015. FINDINGS: The heart is mildly enlarged but stable. There is moderate vascular congestion and probable mild interstitial edema. No infiltrates or effusions. The bony thorax is intact. 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 IMPRESSION: Cardiac enlargement with moderate vascular congestion and mild interstitial edema. Electronically Signed   By: Marijo Sanes M.D.   On: 10/25/2015 18:45    EKG: Independently reviewed.  Assessment/Plan Principal Problem:   Sickle cell disease with crisis ([place])   1. Sickle cell pain crisis -  1. Continue MS contin 2. Dilaudid PCA, dosing per standing orders in FYI tab 3. Scheduled ketorlac 4. Continue hydrea   Code Status: Full  Family Communication: No family in room Disposition Plan: Admit to obs  Time spent: 50 min  GARDNER, JARED M. [facility] Pager [phone]  If 7AM-7PM, please contact the day team taking care of the patient [email or web address] Password [facility] 10/25/2015, 6:55 PM

## 2015-10-25 NOTE — ED Provider Notes (Signed)
CSN: [id number]     Arrival date & time 10/25/15  1446 History   First MD Initiated Contact with Patient 10/25/15 1517     Chief Complaint  Patient presents with  . Sickle Cell Pain Crisis     (Consider location/radiation/quality/duration/timing/severity/associated sxs/prior Treatment) HPI   Blood pressure 129/93, pulse 105, temperature 98.8 F (37.1 C), temperature source Oral, resp. rate 18, height 5\' 3"  (1.6 m), weight 71.215 kg, SpO2 93 %.  Dawn [name] is a 35 y.o. female complaining of exacerbation of her typical sickle cell pain crises. She rates her pain at 9 out of 10, is in the low back, bilateral eyes and bilateral upper extremities. Patient has been taking her long-acting OxyContin and OxyContin immediate release at home with little relief. Patient denies chest pain, shortness of breath, cough, fever, chills, nausea, vomiting, decreased by mouth intake, abnormal vaginal discharge, change in urination or defecation eighth that she called the sickle cell clinic was advised to present to the ED. This is her third visit in the last week, medications have not been changed by her hematologist in the last month.  Past Medical History  Diagnosis Date  . Sickle cell disease ([place])   . Sickle cell disease, type S ([place])   . Blood transfusion   . 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 HCAP (healthcare-associated pneumonia) 05/19/2013  . Sickle cell anemia ([facility])   . 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 Vitamin B12 deficiency 07/07/2015   Past Surgical History  Procedure Laterality Date  . Cholecystectomy    .  left knee acl reconstruction    . Cesarean section      x 2  . Portacath placement    . Portacath placement Left 02/23/2013    Procedure: INSERTION PORT-A-CATH;  Surgeon: Donato Heinz, MD;  Location: [facility];  Service: General;  Laterality: Left;  . Port-a-cath removal Left 07/29/2013    Procedure: REMOVAL PORT-A-CATH;  Surgeon: Scherry Ran, MD;  Location: [facility];   Service: General;  Laterality: Left;  . Tee without cardioversion N/A 08/07/2013    Procedure: TRANSESOPHAGEAL ECHOCARDIOGRAM (TEE);  Surgeon: Arnoldo Lenis, MD;  Location: [facility];  Service: Cardiology;  Laterality: N/A;   Family History  Problem Relation Age of Onset  . Sickle cell trait Mother   . Sickle cell trait Father   . Hypertension Father   . Diabetes Father   . Sickle cell trait Sister   . Cancer Paternal Aunt     Breast   Social History  Substance Use Topics  . Smoking status: Never Smoker   . Smokeless tobacco: Former Systems developer  . Alcohol Use: No   OB History    Gravida Para Term Preterm AB TAB SAB Ectopic Multiple Living   1 1  1      1      Review of Systems  10 systems reviewed and found to be negative, except as noted in the HPI.   Allergies  Desferal; Latex; Lisinopril; and Tape  Home Medications   Prior to Admission medications   Medication Sig Start Date End Date Taking? Authorizing Provider  folic acid (FOLVITE) 1 MG tablet Take 1 tablet (1 mg total) by mouth daily. 08/16/15  Yes [name], FNP  hydroxyurea (HYDREA) 500 MG capsule Take 2 capsules (1,000 mg total) by mouth daily. May take with food to minimize GI side effects. 09/05/15  Yes [name], FNP  ibuprofen (ADVIL,MOTRIN) 200 MG tablet Take 400 mg  by mouth every 6 (six) hours as needed for moderate pain.    Yes Historical Provider, MD  loratadine (CLARITIN) 10 MG tablet Take 10 mg by mouth daily.   Yes Historical Provider, MD  morphine (MS CONTIN) 15 MG 12 hr tablet Take 1 tablet (15 mg total) by mouth every 12 (twelve) hours. 10/06/15  Yes [name], FNP  Multiple Vitamin (MULTIVITAMIN WITH MINERALS) TABS tablet Take 1 tablet by mouth daily.   Yes Historical Provider, MD  Oxycodone HCl 10 MG TABS Take 1 tablet (10 mg total) by mouth every 6 (six) hours as needed. Patient taking differently: Take 10 mg by mouth every 6 (six) hours as needed (breakthrough pain).  10/06/15   Yes [name], FNP  promethazine (PHENERGAN) 12.5 MG tablet Take 1 tablet (12.5 mg total) by mouth every 6 (six) hours as needed for nausea. 08/31/15  Yes [name], MD  Vitamin D, Ergocalciferol, (DRISDOL) 50000 UNITS CAPS capsule Take 1 capsule (50,000 Units total) by mouth every 7 (seven) days. Takes on Sundays. 08/16/15  Yes [name], FNP   BP 148/91 mmHg  Pulse 99  Temp(Src) 98.8 F (37.1 C) (Oral)  Resp 17  Ht 5\' 3"  (1.6 m)  Wt 71.215 kg  BMI 27.82 kg/m2  SpO2 92% Physical Exam  Constitutional: She is oriented to person, place, and time. She appears well-developed and well-nourished. No distress.  HENT:  Head: Normocephalic and atraumatic.  Mouth/Throat: Oropharynx is clear and moist.  Eyes: Conjunctivae and EOM are normal. Pupils are equal, round, and reactive to light.  Very mild scleral icterus  Neck: Normal range of motion. No JVD present. No tracheal deviation present.  Cardiovascular: Normal rate, regular rhythm and intact distal pulses.   Pulmonary/Chest: Effort normal and breath sounds normal. No stridor. No respiratory distress. She has no wheezes. She has no rales. She exhibits no tenderness.  Abdominal: Soft. Bowel sounds are normal. She exhibits no distension and no mass. There is no tenderness. There is no rebound and no guarding.  Musculoskeletal: Normal range of motion. She exhibits no edema or tenderness.  No calf asymmetry, superficial collaterals, palpable cords, edema, Homans sign negative bilaterally.    Neurological: She is alert and oriented to person, place, and time.  Skin: Skin is warm. She is not diaphoretic.  Psychiatric: She has a normal mood and affect.  Nursing note and vitals reviewed.   ED Course  Procedures (including critical care time) Labs Review Labs Reviewed  COMPREHENSIVE METABOLIC PANEL - Abnormal; Notable for the following:    Glucose, Bld 121 (*)    Calcium 8.6 (*)    Albumin 3.0 (*)    AST 89 (*)    Alkaline  Phosphatase 161 (*)    Total Bilirubin 9.3 (*)    All other components within normal limits  CBC WITH DIFFERENTIAL/PLATELET - Abnormal; Notable for the following:    RBC 2.25 (*)    Hemoglobin 7.9 (*)    HCT 21.9 (*)    MCH 35.1 (*)    MCHC 36.1 (*)    RDW 28.3 (*)    Monocytes Absolute 1.1 (*)    All other components within normal limits  RETICULOCYTES - Abnormal; Notable for the following:    Retic Ct Pct >23.0 (*)    RBC. 2.25 (*)    All other components within normal limits  I-STAT BETA HCG BLOOD, ED ([facility], [facility], [facility] ONLY)    Imaging Review No results found. I have personally  reviewed and evaluated these images and lab results as part of my medical decision-making.   EKG Interpretation None      MDM   Final diagnoses:  Sickle cell pain crisis ([place])    Filed Vitals:   10/25/15 1502 10/25/15 1504 10/25/15 1616  BP: 129/93  148/91  Pulse: 105  99  Temp: 98.8 F (37.1 C)    TempSrc: Oral    Resp: 18  17  Height: 5\' 3"  (1.6 m)    Weight: 71.215 kg    SpO2: 93% 93% 92%    Medications  HYDROmorphone (DILAUDID) injection 2 mg (2 mg Intravenous Given 10/25/15 1723)  ondansetron (ZOFRAN) injection 4 mg (4 mg Intravenous Given 10/25/15 1548)  promethazine (PHENERGAN) injection 12.5 mg (12.5 mg Intravenous Given 10/25/15 1755)    Dawn [name] is 35 y.o. female presenting with exacerbation of her typical sickle cell pain crises. She is in pain in the low back, bilateral legs and bilateral arms. This is typical for her. She's taking her home medications with little relief. Patient is afebrile, overall well appearing. Eating and drinking normally, there does not appear to be an inciting infection or event. No symptoms consistent with acute chest.   Patient has had 3 doses of 2 mg Dilaudid and she is still in significant pain. Bloodwork supports that this is an acute sickle cell crisis with a reticulocyte count greater than 23%, her total bilirubin is 9.3 and AST is 89. Hemoglobin is  7.9 which is not atypical for her baseline. This patient will need admission for acute sickle cell crisis.   Case discussed with Dr. Alcario Drought who accepts admission.   Patient's oxygen level was lower than I would like it to be at around 90%, patient continues to deny chest pain, cough, shortness of breath but considering her oxygen level is low will order chest x-ray. Discussed with Dr. Alcario Drought who will follow-up chest x-ray and UA.  Monico Blitz, PA-C 10/25/15 [place], MD 10/25/15 [phone]

## 2015-10-25 NOTE — ED Notes (Signed)
X-ray at bedside

## 2015-10-25 NOTE — ED Notes (Signed)
Patient reports that she was seen at [facility] on 10/17/15 and 1/29 for sickle cell pain, but pain has returned. Patient states she called the sickle cell clinic and was told to come to the ED. Patient c/o bilateral arm, bilateral leg and lower back pain. Patient denies any SOB or chest pain.

## 2015-10-25 NOTE — Telephone Encounter (Signed)
Patient C/O pain to back, legs and arms.  Patient rates pain 9/10 on pain scale.  Patient states no improvement with home medications.  Patient states she has been to ED several times last week and still no improvement.  Spoke with [place], NP and she advises that patient should go to the ED as it is to late to receive adequate treatment at the day hospital. Patient verbalizes understanding.

## 2015-10-26 DIAGNOSIS — D696 Thrombocytopenia, unspecified: Secondary | ICD-10-CM | POA: Diagnosis not present

## 2015-10-26 DIAGNOSIS — Z452 Encounter for adjustment and management of vascular access device: Secondary | ICD-10-CM | POA: Diagnosis not present

## 2015-10-26 DIAGNOSIS — E869 Volume depletion, unspecified: Secondary | ICD-10-CM | POA: Diagnosis present

## 2015-10-26 DIAGNOSIS — R000 Tachycardia, unspecified: Secondary | ICD-10-CM | POA: Diagnosis not present

## 2015-10-26 DIAGNOSIS — D599 Acquired hemolytic anemia, unspecified: Secondary | ICD-10-CM | POA: Diagnosis not present

## 2015-10-26 DIAGNOSIS — M7989 Other specified soft tissue disorders: Secondary | ICD-10-CM | POA: Diagnosis not present

## 2015-10-26 DIAGNOSIS — F329 Major depressive disorder, single episode, unspecified: Secondary | ICD-10-CM | POA: Diagnosis not present

## 2015-10-26 DIAGNOSIS — N3000 Acute cystitis without hematuria: Secondary | ICD-10-CM | POA: Diagnosis not present

## 2015-10-26 DIAGNOSIS — D638 Anemia in other chronic diseases classified elsewhere: Secondary | ICD-10-CM | POA: Diagnosis not present

## 2015-10-26 DIAGNOSIS — I808 Phlebitis and thrombophlebitis of other sites: Secondary | ICD-10-CM | POA: Diagnosis not present

## 2015-10-26 DIAGNOSIS — D5700 Hb-SS disease with crisis, unspecified: Principal | ICD-10-CM

## 2015-10-26 DIAGNOSIS — K5909 Other constipation: Secondary | ICD-10-CM | POA: Diagnosis not present

## 2015-10-26 DIAGNOSIS — M79601 Pain in right arm: Secondary | ICD-10-CM | POA: Diagnosis not present

## 2015-10-26 DIAGNOSIS — N390 Urinary tract infection, site not specified: Secondary | ICD-10-CM | POA: Diagnosis present

## 2015-10-26 DIAGNOSIS — Z8249 Family history of ischemic heart disease and other diseases of the circulatory system: Secondary | ICD-10-CM | POA: Diagnosis not present

## 2015-10-26 DIAGNOSIS — Z833 Family history of diabetes mellitus: Secondary | ICD-10-CM | POA: Diagnosis not present

## 2015-10-26 LAB — URINE MICROSCOPIC-ADD ON

## 2015-10-26 LAB — URINALYSIS, ROUTINE W REFLEX MICROSCOPIC
GLUCOSE, UA: NEGATIVE mg/dL
Glucose, UA: NEGATIVE mg/dL
Ketones, ur: NEGATIVE mg/dL
Ketones, ur: NEGATIVE mg/dL
NITRITE: POSITIVE — AB
Nitrite: POSITIVE — AB
PH: 5.5 (ref 5.0–8.0)
PROTEIN: 30 mg/dL — AB
Protein, ur: 100 mg/dL — AB
SPECIFIC GRAVITY, URINE: 1.013 (ref 1.005–1.030)
Specific Gravity, Urine: 1.013 (ref 1.005–1.030)
pH: 5 (ref 5.0–8.0)

## 2015-10-26 MED ORDER — HYDROMORPHONE 1 MG/ML IV SOLN
INTRAVENOUS | Status: DC
  Administered 2015-10-26: 10.5 mg via INTRAVENOUS
  Administered 2015-10-26: 5 mg via INTRAVENOUS
  Administered 2015-10-27: 7 mg via INTRAVENOUS
  Administered 2015-10-27: 2.5 mg via INTRAVENOUS
  Administered 2015-10-27: 6.5 mg via INTRAVENOUS
  Administered 2015-10-27: 11:00:00 via INTRAVENOUS
  Administered 2015-10-27: 3.5 mg via INTRAVENOUS
  Filled 2015-10-26: qty 25

## 2015-10-26 MED ORDER — HYDROMORPHONE HCL 2 MG/ML IJ SOLN
2.0000 mg | INTRAMUSCULAR | Status: AC
  Administered 2015-10-26 (×5): 2 mg via INTRAVENOUS
  Filled 2015-10-26 (×2): qty 1

## 2015-10-26 MED ORDER — PROMETHAZINE HCL 25 MG/ML IJ SOLN
12.5000 mg | Freq: Four times a day (QID) | INTRAMUSCULAR | Status: DC | PRN
Start: 2015-10-26 — End: 2015-11-04
  Administered 2015-10-26: 25 mg via INTRAVENOUS
  Administered 2015-10-26: 12.5 mg via INTRAVENOUS
  Administered 2015-10-26 – 2015-11-01 (×13): 25 mg via INTRAVENOUS
  Administered 2015-11-02: 12.5 mg via INTRAVENOUS
  Administered 2015-11-03: 25 mg via INTRAVENOUS
  Filled 2015-10-26 (×17): qty 1

## 2015-10-26 MED ORDER — HYDROMORPHONE 1 MG/ML IV SOLN: INTRAVENOUS | Status: DC

## 2015-10-26 MED ORDER — HYDROMORPHONE HCL 2 MG/ML IJ SOLN
2.0000 mg | INTRAMUSCULAR | Status: DC | PRN
  Administered 2015-10-26 – 2015-10-30 (×20): 2 mg via INTRAVENOUS
  Filled 2015-10-26 (×23): qty 1

## 2015-10-26 MED ORDER — DEXTROSE-NACL 5-0.45 % IV SOLN
INTRAVENOUS | Status: DC
  Administered 2015-10-26 – 2015-10-27 (×3): via INTRAVENOUS

## 2015-10-26 NOTE — Progress Notes (Signed)
SICKLE CELL SERVICE PROGRESS NOTE  Dawn [name] [id number] DOB: [birth year] DOA: 10/25/2015 PCP: Angelica Chessman, MD  Assessment/Plan: Principal Problem:   Sickle cell disease with crisis ([place])  1. Hb SS with crisis: Pt states that pain has been present for 2 weeks. She was seen in acute out-patient settings twice in the last 2 weeks and still has not had any sustained relief of her pain. Currently her pain is at 10/10 and localized to legs, arms and feet. I have adjusted PCA to a lower bolus dose and added scheduled clinician assisted doses. Continue Toradol and discontinue Ibuprofen. Start IVF at 100 ml/hr. Re-assess pain control tomorrow. 2. Anemia of chronic disease. Hb at baseline.  3. Chronic pain: Continue MS Contin 15 mg.  4. Sickle Cell Disease: Currently patient is on a sub-therapeutic dose of Hydrea will increase to alternating 1000 mg and 1500 mg QOD.  5. Suspect UTI: Pt has nitrites in urine but no bacteria or WBC. Will repeat U/A.  Code Status: Full Code Family Communication: N/A Disposition Plan: Not yet ready for discharge  [place].  Pager [phone]. If 7PM-7AM, please contact night-coverage.  10/26/2015, 11:44 AM    Interim History:  Pt states that pain has been present for 2 weeks. She was seen in acute out-patient settings twice in the last 2 weeks and still has not had any sustained relief of her pain. Currently her pain is at 10/10 and localized to legs, arms and feet.  Consultants:  None  Procedures:  None  Antibiotics:  None   Objective: Filed Vitals:   10/26/15 0220 10/26/15 0400 10/26/15 0536 10/26/15 0752  BP: 136/85  124/82   Pulse: 90  89   Temp: 97.5 F (36.4 C)  97.5 F (36.4 C)   TempSrc: Oral  Oral   Resp: 12 12 12 11   Height:      Weight:      SpO2: 97% 94% 93% 95%   Weight change:   Intake/Output Summary (Last 24 hours) at 10/26/15 1144 Last data filed at 10/26/15 1025  Gross per 24 hour  Intake    480 ml  Output     800 ml  Net   -320 ml    General: Alert, awake, oriented x3, in mild distress.  HEENT: [place]/AT PEERL, EOMI, mild icterus Neck: Trachea midline,  no masses, no thyromegal,y no JVD, no carotid bruit OROPHARYNX:  Moist, No exudate/ erythema/lesions.  Heart: Regular rate and rhythm, without murmurs, rubs, gallops, PMI non-displaced, no heaves or thrills on palpation.  Lungs: Clear to auscultation, no wheezing or rhonchi noted. No increased vocal fremitus resonant to percussion  Abdomen: Soft, nontender, nondistended, positive bowel sounds, no masses no hepatosplenomegaly noted..  Neuro: No focal neurological deficits noted cranial nerves II through XII grossly intact.  Strength at functional baseline in bilateral upper and lower extremities. Musculoskeletal: No warm swelling or erythema around joints, no spinal tenderness noted. Psychiatric: Patient alert and oriented x3, good insight and cognition, good recent to remote recall. Lymph node survey: No cervical axillary or inguinal lymphadenopathy noted.   Data Reviewed: Basic Metabolic Panel:  Recent Labs Lab 10/22/15 0030 10/25/15 1531  NA 136 136  K 3.7 3.6  CL 106 105  CO2 22 23  GLUCOSE 91 121*  BUN 6 7  CREATININE 0.41* 0.47  CALCIUM 8.5* 8.6*   Liver Function Tests:  Recent Labs Lab 10/25/15 1531  AST 89*  ALT 32  ALKPHOS 161*  BILITOT 9.3*  PROT 7.9  ALBUMIN 3.0*   No results for input(s): LIPASE, AMYLASE in the last 168 hours. No results for input(s): AMMONIA in the last 168 hours. CBC:  Recent Labs Lab 10/22/15 0030 10/25/15 1531  WBC 8.3 8.0  NEUTROABS 4.1 4.3  HGB 8.1* 7.9*  HCT 22.2* 21.9*  MCV 96.5 97.3  PLT 147* PLATELET CLUMPS NOTED ON SMEAR, COUNT APPEARS ADEQUATE   Cardiac Enzymes: No results for input(s): CKTOTAL, CKMB, CKMBINDEX, TROPONINI in the last 168 hours. BNP (last 3 results) No results for input(s): BNP in the last 8760 hours.  ProBNP (last 3 results) No results for input(s):  PROBNP in the last 8760 hours.  CBG: No results for input(s): GLUCAP in the last 168 hours.  No results found for this or any previous visit (from the past 240 hour(s)).   Studies: Dg Chest Port 1 View  10/25/2015  CLINICAL DATA:  Sickle cell pain.  Shortness of breath. EXAM: PORTABLE CHEST 1 VIEW COMPARISON:  Multiple previous chest x-rays. The most recent is 10/17/2015. FINDINGS: The heart is mildly enlarged but stable. There is moderate vascular congestion and probable mild interstitial edema. No infiltrates or effusions. The bony thorax is intact. 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 IMPRESSION: Cardiac enlargement with moderate vascular congestion and mild interstitial edema. Electronically Signed   By: Marijo Sanes M.D.   On: 10/25/2015 18:45   Dg Chest Portable 1 View  10/17/2015  CLINICAL DATA:  Sickle cell crisis. EXAM: PORTABLE CHEST 1 VIEW COMPARISON:  None. FINDINGS: Mediastinum and hilar structures normal. Cardiomegaly. No evidence of overt congestive heart failure. No focal infiltrate. No pleural effusion or pneumothorax. 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 IMPRESSION: 1. Cardiomegaly. No evidence of overt congestive heart failure. No focal pulmonary infiltrate. 2. 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 enoxaparin (LOVENOX) injection  40 mg Subcutaneous Q24H  . folic acid  1 mg Oral Daily  . HYDROmorphone   Intravenous 6 times per day  .  HYDROmorphone (DILAUDID) injection  2 mg Intravenous Q2H  . hydroxyurea  1,000 mg Oral Daily  . ketorolac  30 mg Intravenous 4 times per day  . loratadine  10 mg Oral Daily  . morphine  15 mg Oral Q12H  . multivitamin with minerals  1 tablet Oral Daily  . senna-docusate  1 tablet Oral BID   Continuous Infusions: . dextrose 5 % and 0.45% NaCl 100 mL/hr at 10/26/15 0944     Time spent 40 minutes.

## 2015-10-27 DIAGNOSIS — N3000 Acute cystitis without hematuria: Secondary | ICD-10-CM

## 2015-10-27 DIAGNOSIS — D638 Anemia in other chronic diseases classified elsewhere: Secondary | ICD-10-CM

## 2015-10-27 DIAGNOSIS — R000 Tachycardia, unspecified: Secondary | ICD-10-CM

## 2015-10-27 LAB — CBC WITH DIFFERENTIAL/PLATELET
BASOS ABS: 0 10*3/uL (ref 0.0–0.1)
Basophils Relative: 0 %
EOS PCT: 1 %
Eosinophils Absolute: 0.1 10*3/uL (ref 0.0–0.7)
HCT: 16.4 % — ABNORMAL LOW (ref 36.0–46.0)
Hemoglobin: 6 g/dL — CL (ref 12.0–15.0)
LYMPHS ABS: 3.6 10*3/uL (ref 0.7–4.0)
Lymphocytes Relative: 29 %
MCH: 35.5 pg — AB (ref 26.0–34.0)
MCHC: 36.6 g/dL — AB (ref 30.0–36.0)
MCV: 97 fL (ref 78.0–100.0)
MONO ABS: 1.4 10*3/uL — AB (ref 0.1–1.0)
MONOS PCT: 11 %
NEUTROS PCT: 59 %
Neutro Abs: 7.4 10*3/uL (ref 1.7–7.7)
PLATELETS: 105 10*3/uL — AB (ref 150–400)
RBC: 1.69 MIL/uL — AB (ref 3.87–5.11)
RDW: 29.8 % — ABNORMAL HIGH (ref 11.5–15.5)
WBC: 12.5 10*3/uL — AB (ref 4.0–10.5)

## 2015-10-27 LAB — RETICULOCYTES: RBC.: 1.69 MIL/uL — AB (ref 3.87–5.11)

## 2015-10-27 LAB — PREPARE RBC (CROSSMATCH)

## 2015-10-27 LAB — LACTATE DEHYDROGENASE: LDH: 420 U/L — ABNORMAL HIGH (ref 98–192)

## 2015-10-27 MED ORDER — SODIUM CHLORIDE 0.9 % IV SOLN: Freq: Once | INTRAVENOUS | Status: DC

## 2015-10-27 MED ORDER — DEXTROSE-NACL 5-0.45 % IV SOLN
INTRAVENOUS | Status: DC
  Administered 2015-10-27 – 2015-11-02 (×4): via INTRAVENOUS
  Administered 2015-11-04: 1000 mL via INTRAVENOUS

## 2015-10-27 MED ORDER — DIPHENHYDRAMINE HCL 25 MG PO CAPS
25.0000 mg | ORAL_CAPSULE | ORAL | Status: DC | PRN
  Administered 2015-10-28: 25 mg via ORAL
  Filled 2015-10-27: qty 1

## 2015-10-27 MED ORDER — HYDROMORPHONE 1 MG/ML IV SOLN
INTRAVENOUS | Status: DC
  Administered 2015-10-27: 10.2 mg via INTRAVENOUS
  Administered 2015-10-28: 2.4 mg via INTRAVENOUS
  Administered 2015-10-28: 1.2 mg via INTRAVENOUS
  Administered 2015-10-28: 11.4 mg via INTRAVENOUS
  Administered 2015-10-28: 1.8 mg via INTRAVENOUS
  Administered 2015-10-28: 11.78 mg via INTRAVENOUS
  Administered 2015-10-28: 10:00:00 via INTRAVENOUS
  Administered 2015-10-28: 3.6 mg via INTRAVENOUS
  Administered 2015-10-28: 4.5 mg via INTRAVENOUS
  Administered 2015-10-29: 1.8 mg via INTRAVENOUS
  Administered 2015-10-29: 4.2 mg via INTRAVENOUS
  Administered 2015-10-29: 15.9 mg via INTRAVENOUS
  Administered 2015-10-29: 10:00:00 via INTRAVENOUS
  Administered 2015-10-29: 5 mg via INTRAVENOUS
  Administered 2015-10-29: 3.59 mg via INTRAVENOUS
  Administered 2015-10-30: 7.07 mg via INTRAVENOUS
  Administered 2015-10-30: 5.4 mg via INTRAVENOUS
  Administered 2015-10-30: 3.6 mg via INTRAVENOUS
  Administered 2015-10-30: 18:00:00 via INTRAVENOUS
  Administered 2015-10-30: 6.39 mg via INTRAVENOUS
  Administered 2015-10-30 (×2): 3.6 mg via INTRAVENOUS
  Administered 2015-10-31: 6 mg via INTRAVENOUS
  Administered 2015-10-31: 1.8 mg via INTRAVENOUS
  Administered 2015-10-31 (×2): 4.2 mg via INTRAVENOUS
  Administered 2015-10-31: 4.19 mg via INTRAVENOUS
  Administered 2015-10-31: 8.4 mg via INTRAVENOUS
  Administered 2015-10-31: 6 mg via INTRAVENOUS
  Administered 2015-10-31: 14:00:00 via INTRAVENOUS
  Administered 2015-11-01: 6.6 mg via INTRAVENOUS
  Administered 2015-11-01: 4.8 mg via INTRAVENOUS
  Administered 2015-11-01: 6 mg via INTRAVENOUS
  Administered 2015-11-01: 4.8 mg via INTRAVENOUS
  Administered 2015-11-01: 10:00:00 via INTRAVENOUS
  Administered 2015-11-01: 4.2 mg via INTRAVENOUS
  Administered 2015-11-01: 6.8 mg via INTRAVENOUS
  Administered 2015-11-02: 16.1 mL via INTRAVENOUS
  Administered 2015-11-02: 12.5 mg via INTRAVENOUS
  Administered 2015-11-02: 4.8 mg via INTRAVENOUS
  Administered 2015-11-02: 2.89 mL via INTRAVENOUS
  Administered 2015-11-02: 19:00:00 via INTRAVENOUS
  Administered 2015-11-02: 1.2 mg via INTRAVENOUS
  Administered 2015-11-02: 10.7 mL via INTRAVENOUS
  Administered 2015-11-03: 8 mg via INTRAVENOUS
  Administered 2015-11-03: 3.6 mg via INTRAVENOUS
  Filled 2015-10-27 (×8): qty 25

## 2015-10-27 MED ORDER — CIPROFLOXACIN HCL 250 MG PO TABS
250.0000 mg | ORAL_TABLET | Freq: Two times a day (BID) | ORAL | Status: AC
  Administered 2015-10-27 – 2015-10-30 (×6): 250 mg via ORAL
  Filled 2015-10-27 (×6): qty 1

## 2015-10-27 MED ORDER — SODIUM CHLORIDE 0.9 % IV SOLN
25.0000 mg | INTRAVENOUS | Status: DC | PRN
  Administered 2015-11-02: 25 mg via INTRAVENOUS
  Filled 2015-10-27: qty 0.5

## 2015-10-27 MED ORDER — SODIUM CHLORIDE 0.9 % IV BOLUS (SEPSIS)
1000.0000 mL | Freq: Once | INTRAVENOUS | Status: AC
  Administered 2015-10-27: 1000 mL via INTRAVENOUS

## 2015-10-27 NOTE — Progress Notes (Signed)
SICKLE CELL SERVICE PROGRESS NOTE  Dawn [name] [id number] DOB: [birth year] DOA: 10/25/2015 PCP: Angelica Chessman, MD  Assessment/Plan: Principal Problem:   Sickle cell disease with crisis ([place]) Active Problems:   Sickle cell anemia ([place])  1. Hb SS with crisis:  Will increase PCA bolus to 0.6 mg and continue clinician assisted doses on a PRN basis. Continue Toradol and discontinue Ibuprofen. Start IVF at 100 ml/hr. Re-assess pain control tomorrow. 2. Tachycardia: Pt appears clinically volume depleted. Will give  Bolus of 0.9 NS and then resume D5.45.  3. Anemia of chronic disease. Hb at baseline.  4. Chronic pain: Continue MS Contin 15 mg.  5. Sickle Cell Disease: Currently patient is on a sub-therapeutic dose of Hydrea will increase to alternating 1000 mg and 1500 mg QOD.  6. Suspect UTI: Repeat U/A shows nitrites and WBC's. Will treat for UTI with Ciprofloxacin..  Code Status: Full Code Family Communication: N/A Disposition Plan: Not yet ready for discharge  [place].  Pager [phone]. If 7PM-7AM, please contact night-coverage.  10/27/2015, 2:58 PM  LOS: 1 day   Interim History:  Pt states that pain has been present for 2 weeks. She was seen in acute out-patient settings twice in the last 2 weeks and still has not had any sustained relief of her pain. Currently her pain is at 8/10 and localized to legs, arms and feet.  Consultants:  None  Procedures:  None  Antibiotics:  None   Objective: Filed Vitals:   10/27/15 0756 10/27/15 1000 10/27/15 1245 10/27/15 1250  BP:  152/95 129/72   Pulse:  119 115   Temp:  98.4 F (36.9 C) 98.5 F (36.9 C)   TempSrc:  Oral Oral   Resp: 19 17 14 17   Height:      Weight:      SpO2: 91% 93%  97%   Weight change: 15 lb 13.5 oz (7.185 kg)  Intake/Output Summary (Last 24 hours) at 10/27/15 1458 Last data filed at 10/27/15 1130  Gross per 24 hour  Intake 4008.67 ml  Output   1000 ml  Net 3008.67 ml    General:  Alert, awake, oriented x3, in mild distress.  HEENT: [place]/AT PEERL, EOMI, mild icterus Neck: Trachea midline,  no masses, no thyromegal,y no JVD, no carotid bruit OROPHARYNX:  Moist, No exudate/ erythema/lesions.  Heart: Regular rate and rhythm, without murmurs, rubs, gallops, PMI non-displaced, no heaves or thrills on palpation.  Lungs: Clear to auscultation, no wheezing or rhonchi noted. No increased vocal fremitus resonant to percussion  Abdomen: Soft, nontender, nondistended, positive bowel sounds, no masses no hepatosplenomegaly noted..  Neuro: No focal neurological deficits noted cranial nerves II through XII grossly intact.  Strength at functional baseline in bilateral upper and lower extremities. Musculoskeletal: No warm swelling or erythema around joints, no spinal tenderness noted. Psychiatric: Patient alert and oriented x3, good insight and cognition, good recent to remote recall. Lymph node survey: No cervical axillary or inguinal lymphadenopathy noted.   Data Reviewed: Basic Metabolic Panel:  Recent Labs Lab 10/22/15 0030 10/25/15 1531  NA 136 136  K 3.7 3.6  CL 106 105  CO2 22 23  GLUCOSE 91 121*  BUN 6 7  CREATININE 0.41* 0.47  CALCIUM 8.5* 8.6*   Liver Function Tests:  Recent Labs Lab 10/25/15 1531  AST 89*  ALT 32  ALKPHOS 161*  BILITOT 9.3*  PROT 7.9  ALBUMIN 3.0*   No results for input(s): LIPASE, AMYLASE in the last 168 hours. No  results for input(s): AMMONIA in the last 168 hours. CBC:  Recent Labs Lab 10/22/15 0030 10/25/15 1531 10/27/15 1025  WBC 8.3 8.0 12.5*  NEUTROABS 4.1 4.3 7.4  HGB 8.1* 7.9* 6.0*  HCT 22.2* 21.9* 16.4*  MCV 96.5 97.3 97.0  PLT 147* PLATELET CLUMPS NOTED ON SMEAR, COUNT APPEARS ADEQUATE 105*   Cardiac Enzymes: No results for input(s): CKTOTAL, CKMB, CKMBINDEX, TROPONINI in the last 168 hours. BNP (last 3 results) No results for input(s): BNP in the last 8760 hours.  ProBNP (last 3 results) No results for  input(s): PROBNP in the last 8760 hours.  CBG: No results for input(s): GLUCAP in the last 168 hours.  No results found for this or any previous visit (from the past 240 hour(s)).   Studies: Dg Chest Port 1 View  10/25/2015  CLINICAL DATA:  Sickle cell pain.  Shortness of breath. EXAM: PORTABLE CHEST 1 VIEW COMPARISON:  Multiple previous chest x-rays. The most recent is 10/17/2015. FINDINGS: The heart is mildly enlarged but stable. There is moderate vascular congestion and probable mild interstitial edema. No infiltrates or effusions. The bony thorax is intact. 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 IMPRESSION: Cardiac enlargement with moderate vascular congestion and mild interstitial edema. Electronically Signed   By: Marijo Sanes M.D.   On: 10/25/2015 18:45   Dg Chest Portable 1 View  10/17/2015  CLINICAL DATA:  Sickle cell crisis. EXAM: PORTABLE CHEST 1 VIEW COMPARISON:  None. FINDINGS: Mediastinum and hilar structures normal. Cardiomegaly. No evidence of overt congestive heart failure. No focal infiltrate. No pleural effusion or pneumothorax. 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 IMPRESSION: 1. Cardiomegaly. No evidence of overt congestive heart failure. No focal pulmonary infiltrate. 2. 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 sodium chloride   Intravenous Once  . enoxaparin (LOVENOX) injection  40 mg Subcutaneous Q24H  . folic acid  1 mg Oral Daily  . HYDROmorphone   Intravenous 6 times per day  . hydroxyurea  1,000 mg Oral Daily  . ketorolac  30 mg Intravenous 4 times per day  . loratadine  10 mg Oral Daily  . morphine  15 mg Oral Q12H  . multivitamin with minerals  1 tablet Oral Daily  . senna-docusate  1 tablet Oral BID  . sodium chloride  1,000 mL Intravenous Once   Continuous Infusions: . dextrose 5 % and 0.45%  NaCl 100 mL/hr at 10/27/15 1241    Time spent 40 minutes.

## 2015-10-27 NOTE — Progress Notes (Signed)
CRITICAL VALUE ALERT  Critical value received:  Hgb 6.0   Date of notification:  09/26/15  Time of notification:  10:30 Critical value read back Yes  Nurse who received alert:  Kirkland Hun RN  MD notified (1st page):  Zigmund Daniel  Time of first page:  10:40  MD notified (2nd page):  Time of second page:  Responding MD:    Time MD responded:  11:00

## 2015-10-28 ENCOUNTER — Inpatient Hospital Stay (HOSPITAL_COMMUNITY): Payer: BLUE CROSS/BLUE SHIELD

## 2015-10-28 LAB — CBC WITH DIFFERENTIAL/PLATELET
Basophils Absolute: 0 10*3/uL (ref 0.0–0.1)
Basophils Relative: 0 %
Eosinophils Absolute: 0.3 10*3/uL (ref 0.0–0.7)
Eosinophils Relative: 2 %
HCT: 14.3 % — ABNORMAL LOW (ref 36.0–46.0)
Hemoglobin: 5.1 g/dL — CL (ref 12.0–15.0)
Lymphocytes Relative: 30 %
Lymphs Abs: 4 10*3/uL (ref 0.7–4.0)
MCH: 34.7 pg — ABNORMAL HIGH (ref 26.0–34.0)
MCHC: 35.7 g/dL (ref 30.0–36.0)
MCV: 97.3 fL (ref 78.0–100.0)
Monocytes Absolute: 1.7 10*3/uL — ABNORMAL HIGH (ref 0.1–1.0)
Monocytes Relative: 13 %
Neutro Abs: 7.4 10*3/uL (ref 1.7–7.7)
Neutrophils Relative %: 55 %
Platelets: DECREASED 10*3/uL (ref 150–400)
RBC: 1.47 MIL/uL — ABNORMAL LOW (ref 3.87–5.11)
RDW: 31.3 % — ABNORMAL HIGH (ref 11.5–15.5)
WBC: 13.4 10*3/uL — ABNORMAL HIGH (ref 4.0–10.5)

## 2015-10-28 LAB — RETICULOCYTES
RBC.: 1.35 MIL/uL — ABNORMAL LOW (ref 3.87–5.11)
Retic Ct Pct: >23 % — ABNORMAL HIGH (ref 0.4–3.1)

## 2015-10-28 LAB — LACTATE DEHYDROGENASE: LDH: 426 U/L — ABNORMAL HIGH (ref 98–192)

## 2015-10-28 MED ORDER — LIDOCAINE HCL 1 % IJ SOLN
INTRAMUSCULAR | Status: AC
  Filled 2015-10-28: qty 20

## 2015-10-28 MED ORDER — SODIUM CHLORIDE 0.9 % IV SOLN
Freq: Once | INTRAVENOUS | Status: AC
  Administered 2015-10-28: 16:00:00 via INTRAVENOUS

## 2015-10-28 MED ORDER — LIDOCAINE HCL 1 % IJ SOLN
INTRAMUSCULAR | Status: DC | PRN
Start: 2015-10-28 — End: 2015-11-04
  Administered 2015-10-28: 5 mL

## 2015-10-28 NOTE — Progress Notes (Signed)
CRITICAL VALUE ALERT  Critical value received:  Hgb 5.2 Date of notification:  10/28/15  Time of notification:  *0800  Critical value read back:yes Nurse who received alert:  Kirkland Hun RN  MD notified (1st page):  Dr. Zigmund Daniel  Time of first page: 0845  MD notified (2nd page):  Time of second page:  Responding MD:  Zigmund Daniel  Time MD responded:  0900

## 2015-10-28 NOTE — Care Management Note (Signed)
Case Management Note  Patient Details  Name: Dawn [name] MRN: [id number] Date of Birth: 1981/09/01  Subjective/Objective:           35 yo admitted with [facility]         Action/Plan: From home with children.  Expected Discharge Date:                  Expected Discharge Plan:  Home/Self Care  In-House Referral:     Discharge planning Services  CM Consult  Post Acute Care Choice:    Choice offered to:     DME Arranged:    DME Agency:     HH Arranged:    HH Agency:     Status of Service:  In process, will continue to follow  Medicare Important Message Given:    Date Medicare IM Given:    Medicare IM give by:    Date Additional Medicare IM Given:    Additional Medicare Important Message give by:     If discussed at [place] of Stay Meetings, dates discussed:    Additional Comments: Chart reviewed and no CM needs identified or communicated at this time. CM will continue to follow. Marney Doctor RN,BSN,NCM [id number] Lynnell Catalan, RN 10/28/2015, 11:23 AM

## 2015-10-28 NOTE — Progress Notes (Signed)
SICKLE CELL SERVICE PROGRESS NOTE  Dawn [name] [id number] DOB: [birth year] DOA: 10/25/2015 PCP: Angelica Chessman, MD  Assessment/Plan: Principal Problem:   Sickle cell disease with crisis ([place]) Active Problems:   Sickle cell anemia (HCC)   Tachycardia   Anemia of chronic disease  1. Hb SS with crisis:  Pt has used 28.18 mg  With 69/49:demans/deliveries in last 24 hours. She has also used 12 mg in clinician assisted doses of Dilaudid. Will icontinue PCA bolus to 0.6 mg and continue clinician assisted doses on a PRN basis. Continue Toradol  Decrease IVF after blood transfused. Re-assess pain control tomorrow. 2. Tachycardia: Improved after IVF. However she is still mildly tachycardic which may ne due to anemia and pain. If still tachycardic after transfusion, would pursue checking TSH. 3. Anemia of chronic disease. Hb decreased to 5.1. Pt to receive transfusion of 2 units of RBC's, 4. Chronic pain: Continue MS Contin 15 mg.  5. Sickle Cell Disease: Currently patient is on a sub-therapeutic dose of Hydrea which is held due to low Hb. 6. Suspect UTI: Repeat U/A shows nitrites and WBC's. On day #2/3 of Ciprofloxacin.  Code Status: Full Code Family Communication: Mother and sister at bedside and updated. Disposition Plan: Not yet ready for discharge  Dawn [name] A.  Pager [phone]. If 7PM-7AM, please contact night-coverage.  10/28/2015, 2:13 PM  LOS: 2 days   Interim History:  Pt states that pain has been present for 2 weeks. She was seen in acute out-patient settings twice in the last 2 weeks and still has not had any sustained relief of her pain. Currently her pain is at 7/10 and localized to legs, arms and feet.  Consultants:  None  Procedures:  None  Antibiotics:  None   Objective: Filed Vitals:   10/28/15 0533 10/28/15 0754 10/28/15 1040 10/28/15 1200  BP:   134/73   Pulse:   112   Temp:   98.8 F (37.1 C)   TempSrc:   Oral   Resp: 7 15 15 13   Height:       Weight:      SpO2: 92% 92% 93% 94%   Weight change:   Intake/Output Summary (Last 24 hours) at 10/28/15 1413 Last data filed at 10/28/15 0533  Gross per 24 hour  Intake 2573.4 ml  Output    800 ml  Net 1773.4 ml    General: Alert, awake, oriented x3, in moderate distress.  HEENT: [place]/AT PEERL, EOMI, mild icterus Neck: Trachea midline,  no masses, no thyromegal,y no JVD, no carotid bruit OROPHARYNX:  Moist, No exudate/ erythema/lesions.  Heart: Regular rate and rhythm, without murmurs, rubs, gallops, PMI non-displaced, no heaves or thrills on palpation.  Lungs: Clear to auscultation, no wheezing or rhonchi noted. No increased vocal fremitus resonant to percussion  Abdomen: Soft, nontender, nondistended, positive bowel sounds, no masses no hepatosplenomegaly noted.  Neuro: No focal neurological deficits noted cranial nerves II through XII grossly intact.  Strength at functional baseline in bilateral upper and lower extremities. Musculoskeletal: No warm swelling or erythema around joints, no spinal tenderness noted. Psychiatric: Patient alert and oriented x3, good insight and cognition, good recent to remote recall. Lymph node survey: No cervical axillary or inguinal lymphadenopathy noted.   Data Reviewed: Basic Metabolic Panel:  Recent Labs Lab 10/22/15 0030 10/25/15 1531  NA 136 136  K 3.7 3.6  CL 106 105  CO2 22 23  GLUCOSE 91 121*  BUN 6 7  CREATININE 0.41* 0.47  CALCIUM 8.5* 8.6*  Liver Function Tests:  Recent Labs Lab 10/25/15 1531  AST 89*  ALT 32  ALKPHOS 161*  BILITOT 9.3*  PROT 7.9  ALBUMIN 3.0*   No results for input(s): LIPASE, AMYLASE in the last 168 hours. No results for input(s): AMMONIA in the last 168 hours. CBC:  Recent Labs Lab 10/22/15 0030 10/25/15 1531 10/27/15 1025 10/28/15 0356  WBC 8.3 8.0 12.5* 13.4*  NEUTROABS 4.1 4.3 7.4 7.4  HGB 8.1* 7.9* 6.0* 5.1*  HCT 22.2* 21.9* 16.4* 14.3*  MCV 96.5 97.3 97.0 97.3  PLT 147* PLATELET  CLUMPS NOTED ON SMEAR, COUNT APPEARS ADEQUATE 105* PLATELET CLUMPS NOTED ON SMEAR, COUNT APPEARS DECREASED   Cardiac Enzymes: No results for input(s): CKTOTAL, CKMB, CKMBINDEX, TROPONINI in the last 168 hours. BNP (last 3 results) No results for input(s): BNP in the last 8760 hours.  ProBNP (last 3 results) No results for input(s): PROBNP in the last 8760 hours.  CBG: No results for input(s): GLUCAP in the last 168 hours.  No results found for this or any previous visit (from the past 240 hour(s)).   Studies: Ir Fluoro Guide Cv Line Right  10/28/2015  CLINICAL DATA:  Sickle cell crisis, poor peripheral IV access. Needs long-term venous access. EXAM: PICC PLACEMENT WITH ULTRASOUND AND FLUOROSCOPY FLUOROSCOPY TIME:  0.9 minutes, 164 uGym2 DAP TECHNIQUE: After written informed consent was obtained, patient was placed in the supine position on angiographic table. Patency of the basilic vein was confirmed with ultrasound with image documentation. An appropriate skin site was determined. Skin site was marked. Region was prepped using maximum barrier technique including cap and mask, sterile gown, sterile gloves, large sterile sheet, and Chlorhexidine as cutaneous antisepsis. The region was infiltrated locally with 1% lidocaine. Under real-time ultrasound guidance, the basilic vein was accessed with a 21 gauge micropuncture needle; the needle tip within the vein was confirmed with ultrasound image documentation. Needle exchanged over a 018 guidewire for a peel-away sheath, through which a 5-French double-lumen power injectable PICC trimmed to 38cm was advanced, positioned with its tip near the cavoatrial junction. Spot chest radiograph confirms appropriate catheter position. Catheter was flushed per protocol and secured externally. The patient tolerated procedure well. COMPLICATIONS: COMPLICATIONS none IMPRESSION: 1. Technically successful five [place] double lumen power injectable PICC placement  Electronically Signed   By: Lucrezia Europe M.D.   On: 10/28/2015 13:33   Ir US Guide Vasc Access Right  10/28/2015  CLINICAL DATA:  Sickle cell crisis, poor peripheral IV access. Needs long-term venous access. EXAM: PICC PLACEMENT WITH ULTRASOUND AND FLUOROSCOPY FLUOROSCOPY TIME:  0.9 minutes, 164 uGym2 DAP TECHNIQUE: After written informed consent was obtained, patient was placed in the supine position on angiographic table. Patency of the basilic vein was confirmed with ultrasound with image documentation. An appropriate skin site was determined. Skin site was marked. Region was prepped using maximum barrier technique including cap and mask, sterile gown, sterile gloves, large sterile sheet, and Chlorhexidine as cutaneous antisepsis. The region was infiltrated locally with 1% lidocaine. Under real-time ultrasound guidance, the basilic vein was accessed with a 21 gauge micropuncture needle; the needle tip within the vein was confirmed with ultrasound image documentation. Needle exchanged over a 018 guidewire for a peel-away sheath, through which a 5-French double-lumen power injectable PICC trimmed to 38cm was advanced, positioned with its tip near the cavoatrial junction. Spot chest radiograph confirms appropriate catheter position. Catheter was flushed per protocol and secured externally. The patient tolerated procedure well. COMPLICATIONS: COMPLICATIONS none IMPRESSION: 1. Technically  successful five [place] double lumen power injectable PICC placement Electronically Signed   By: Lucrezia Europe M.D.   On: 10/28/2015 13:33   Dg Chest Port 1 View  10/25/2015  CLINICAL DATA:  Sickle cell pain.  Shortness of breath. EXAM: PORTABLE CHEST 1 VIEW COMPARISON:  Multiple previous chest x-rays. The most recent is 10/17/2015. FINDINGS: The heart is mildly enlarged but stable. There is moderate vascular congestion and probable mild interstitial edema. No infiltrates or effusions. The bony thorax is intact. 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 IMPRESSION: Cardiac enlargement with moderate vascular congestion and mild interstitial edema. Electronically Signed   By: Marijo Sanes M.D.   On: 10/25/2015 18:45   Dg Chest Portable 1 View  10/17/2015  CLINICAL DATA:  Sickle cell crisis. EXAM: PORTABLE CHEST 1 VIEW COMPARISON:  None. FINDINGS: Mediastinum and hilar structures normal. Cardiomegaly. No evidence of overt congestive heart failure. No focal infiltrate. No pleural effusion or pneumothorax. 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 IMPRESSION: 1. Cardiomegaly. No evidence of overt congestive heart failure. No focal pulmonary infiltrate. 2. 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 sodium chloride   Intravenous Once  . sodium chloride   Intravenous Once  . ciprofloxacin  250 mg Oral BID  . enoxaparin (LOVENOX) injection  40 mg Subcutaneous Q24H  . folic acid  1 mg Oral Daily  . HYDROmorphone   Intravenous 6 times per day  . ketorolac  30 mg Intravenous 4 times per day  . lidocaine      . loratadine  10 mg Oral Daily  . morphine  15 mg Oral Q12H  . multivitamin with minerals  1 tablet Oral Daily  . senna-docusate  1 tablet Oral BID   Continuous Infusions: . dextrose 5 % and 0.45% NaCl 100 mL/hr at 10/28/15 0030    Time spent 30 minutes.

## 2015-10-28 NOTE — Plan of Care (Signed)
Problem: Phase I Progression Outcomes Goal: Pain controlled with appropriate interventions Outcome: Progressing PCA pump

## 2015-10-28 NOTE — Progress Notes (Signed)
PHARMACY BRIEF NOTE: HYDROXYUREA   By [facility] policy, hydroxyurea is automatically held when any of the following laboratory values occur:  ANC < 2 K  Pltc < 80K in sickle-cell patients; < 100K in other patients  Hgb <= 6 in sickle-cell patients (Hgb 5.1) ; < 8 in other patients  Reticulocytes < 80K when Hgb < 9  Hydroxyurea has been held (discontinued from profile) per policy.   Dia Sitter, PharmD, BCPS 10/28/2015 7:53 AM

## 2015-10-28 NOTE — Procedures (Signed)
R arm PowerPICC placed under US and fluoroscopy No ptx on spot chest radiograph. No complication No blood loss. See complete dictation in [facility] PACS.  

## 2015-10-29 ENCOUNTER — Inpatient Hospital Stay (HOSPITAL_COMMUNITY): Payer: BLUE CROSS/BLUE SHIELD

## 2015-10-29 DIAGNOSIS — M7989 Other specified soft tissue disorders: Secondary | ICD-10-CM

## 2015-10-29 DIAGNOSIS — M79601 Pain in right arm: Secondary | ICD-10-CM

## 2015-10-29 DIAGNOSIS — I808 Phlebitis and thrombophlebitis of other sites: Secondary | ICD-10-CM

## 2015-10-29 LAB — CBC WITH DIFFERENTIAL/PLATELET
BASOS ABS: 0 10*3/uL (ref 0.0–0.1)
BASOS PCT: 0 %
EOS ABS: 0.2 10*3/uL (ref 0.0–0.7)
EOS PCT: 2 %
HCT: 21.2 % — ABNORMAL LOW (ref 36.0–46.0)
HEMOGLOBIN: 7.5 g/dL — AB (ref 12.0–15.0)
LYMPHS PCT: 26 %
Lymphs Abs: 2.1 10*3/uL (ref 0.7–4.0)
MCH: 34.4 pg — ABNORMAL HIGH (ref 26.0–34.0)
MCHC: 35.4 g/dL (ref 30.0–36.0)
MCV: 97.2 fL (ref 78.0–100.0)
Monocytes Absolute: 1.1 10*3/uL — ABNORMAL HIGH (ref 0.1–1.0)
Monocytes Relative: 13 %
NRBC: 87 /100{WBCs} — AB
Neutro Abs: 4.8 10*3/uL (ref 1.7–7.7)
Neutrophils Relative %: 59 %
Platelets: 110 10*3/uL — ABNORMAL LOW (ref 150–400)
RBC: 2.18 MIL/uL — AB (ref 3.87–5.11)
RDW: 25.7 % — AB (ref 11.5–15.5)
WBC: 8.2 10*3/uL (ref 4.0–10.5)

## 2015-10-29 LAB — BASIC METABOLIC PANEL
Anion gap: 7 (ref 5–15)
BUN: 8 mg/dL (ref 6–20)
CALCIUM: 8.1 mg/dL — AB (ref 8.9–10.3)
CHLORIDE: 113 mmol/L — AB (ref 101–111)
CO2: 25 mmol/L (ref 22–32)
CREATININE: 0.38 mg/dL — AB (ref 0.44–1.00)
GFR calc non Af Amer: >60 mL/min (ref >60)
Glucose, Bld: 100 mg/dL — ABNORMAL HIGH (ref 65–99)
Potassium: 3.5 mmol/L (ref 3.5–5.1)
SODIUM: 145 mmol/L (ref 135–145)

## 2015-10-29 LAB — RETICULOCYTES
RBC.: 2.18 MIL/uL — AB (ref 3.87–5.11)
RETIC CT PCT: 22.7 % — AB (ref 0.4–3.1)
Retic Count, Absolute: 494.9 10*3/uL — ABNORMAL HIGH (ref 19.0–186.0)

## 2015-10-29 NOTE — Progress Notes (Addendum)
Patient ID: [name], female   DOB: 1980-10-01, 35 y.o.   MRN: [id number] SICKLE CELL SERVICE PROGRESS NOTE  AAIRA BODILY [id number] DOB: [birth year] DOA: 10/25/2015 PCP: Angelica Chessman, MD  Assessment/Plan: Principal Problem:   Sickle cell disease with crisis ([place]) Active Problems:   Sickle cell anemia (HCC)   Tachycardia   Anemia of chronic disease  1. Hb SS with crisis: Patient still has pain, currently rated at 8 out of 10, made worse by right upper limb swelling since insertion of PICC line. Continue pain medication including Toradol and PCA pump + scheduled clinician assisted doses. 2. Anemia: S/P blood transfusion, repeat Hb is 7.5  3. Chronic pain:Continue MS Contin 15 mg  4.   Phlebitis RUL PICC Vs DVT: VAS [place] UPPER EXT VENOUS DUPLEX RIGHT Will evaluate with results  Code Status: Full Code Family Communication: N/A Disposition Plan: Not yet ready for discharge  Nikelle Malatesta  If 7PM-7AM, please contact night-coverage.  10/29/2015, 5:26 PM  LOS: 3 days   Admitting History: Bular L Pinnock is a 35 y.o. female with h/o HGB SS disease, patient presents to ED with pain crisis. Pain located in low back, BLE, BUE. 9/10 intensity, typical for pain crisis.Taking MS contin and oxycodone without relief of pain. No CP, no SOB, no cough, fever, chills, N/V.  Procedures:   PICC Right Upper Arm  Antibiotics:   None  HPI/Subjective: Patient complains of right upper limb swelling and tightness since insertion of PICC line. She has no fever, no bruises. She still in pain from the right upper limb swelling as well as her sickle cell. She denies any chest pain, no cough. She was transfused yesterday.  Objective: Filed Vitals:   10/29/15 0807 10/29/15 0935 10/29/15 1310 10/29/15 1640  BP:  133/79 135/80   Pulse:  101 97   Temp:  98.4 F (36.9 C) 97.6 F (36.4 C)   TempSrc:  Oral Oral   Resp: 11 15 12 20   Height:      Weight:      SpO2: 97% 99% 98% 99%   Weight  change:   Intake/Output Summary (Last 24 hours) at 10/29/15 1726 Last data filed at 10/29/15 1230  Gross per 24 hour  Intake   2512 ml  Output      0 ml  Net   2512 ml    General: Alert, awake, oriented x3, in no acute distress.  HEENT: [place]/AT PEERL, EOMI Neck: Trachea midline,  no masses, no thyromegal,y no JVD, no carotid bruit OROPHARYNX:  Moist, No exudate/ erythema/lesions.  Heart: Regular rate and rhythm, without murmurs, rubs, gallops, PMI non-displaced, no heaves or thrills on palpation.  Lungs: Clear to auscultation, no wheezing or rhonchi noted. No increased vocal fremitus resonant to percussion  Abdomen: Soft, nontender, nondistended, positive bowel sounds, no masses no hepatosplenomegaly noted..  Neuro: No focal neurological deficits noted cranial nerves II through XII grossly intact. DTRs 2+ bilaterally upper and lower extremities. Strength 5 out of 5 in bilateral upper and lower extremities. Musculoskeletal: PICC line in situ on right upper arm, swelling and tender, no redness, no bruises, intact sensation. No pedal edema. Psychiatric: Patient alert and oriented x3, good insight and cognition, good recent to remote recall. Lymph node survey: No cervical axillary or inguinal lymphadenopathy noted.   Data Reviewed: Basic Metabolic Panel:  Recent Labs Lab 10/25/15 1531 10/29/15 1145  NA 136 145  K 3.6 3.5  CL 105 113*  CO2 23 25  GLUCOSE  121* 100*  BUN 7 8  CREATININE 0.47 0.38*  CALCIUM 8.6* 8.1*   Liver Function Tests:  Recent Labs Lab 10/25/15 1531  AST 89*  ALT 32  ALKPHOS 161*  BILITOT 9.3*  PROT 7.9  ALBUMIN 3.0*   No results for input(s): LIPASE, AMYLASE in the last 168 hours. No results for input(s): AMMONIA in the last 168 hours. CBC:  Recent Labs Lab 10/25/15 1531 10/27/15 1025 10/28/15 0356 10/29/15 1145  WBC 8.0 12.5* 13.4* 8.2  NEUTROABS 4.3 7.4 7.4 4.8  HGB 7.9* 6.0* 5.1* 7.5*  HCT 21.9* 16.4* 14.3* 21.2*  MCV 97.3 97.0 97.3  97.2  PLT PLATELET CLUMPS NOTED ON SMEAR, COUNT APPEARS ADEQUATE 105* PLATELET CLUMPS NOTED ON SMEAR, COUNT APPEARS DECREASED 110*   Cardiac Enzymes: No results for input(s): CKTOTAL, CKMB, CKMBINDEX, TROPONINI in the last 168 hours. BNP (last 3 results) No results for input(s): BNP in the last 8760 hours.  ProBNP (last 3 results) No results for input(s): PROBNP in the last 8760 hours.  CBG: No results for input(s): GLUCAP in the last 168 hours.  No results found for this or any previous visit (from the past 240 hour(s)).   Studies: Ir Fluoro Guide Cv Line Right  10/28/2015  CLINICAL DATA:  Sickle cell crisis, poor peripheral IV access. Needs long-term venous access. EXAM: PICC PLACEMENT WITH ULTRASOUND AND FLUOROSCOPY FLUOROSCOPY TIME:  0.9 minutes, 164 uGym2 DAP TECHNIQUE: After written informed consent was obtained, patient was placed in the supine position on angiographic table. Patency of the basilic vein was confirmed with ultrasound with image documentation. An appropriate skin site was determined. Skin site was marked. Region was prepped using maximum barrier technique including cap and mask, sterile gown, sterile gloves, large sterile sheet, and Chlorhexidine as cutaneous antisepsis. The region was infiltrated locally with 1% lidocaine. Under real-time ultrasound guidance, the basilic vein was accessed with a 21 gauge micropuncture needle; the needle tip within the vein was confirmed with ultrasound image documentation. Needle exchanged over a 018 guidewire for a peel-away sheath, through which a 5-French double-lumen power injectable PICC trimmed to 38cm was advanced, positioned with its tip near the cavoatrial junction. Spot chest radiograph confirms appropriate catheter position. Catheter was flushed per protocol and secured externally. The patient tolerated procedure well. COMPLICATIONS: COMPLICATIONS none IMPRESSION: 1. Technically successful five [place] double lumen power injectable  PICC placement Electronically Signed   By: Lucrezia Europe M.D.   On: 10/28/2015 13:33   Ir US Guide Vasc Access Right  10/28/2015  CLINICAL DATA:  Sickle cell crisis, poor peripheral IV access. Needs long-term venous access. EXAM: PICC PLACEMENT WITH ULTRASOUND AND FLUOROSCOPY FLUOROSCOPY TIME:  0.9 minutes, 164 uGym2 DAP TECHNIQUE: After written informed consent was obtained, patient was placed in the supine position on angiographic table. Patency of the basilic vein was confirmed with ultrasound with image documentation. An appropriate skin site was determined. Skin site was marked. Region was prepped using maximum barrier technique including cap and mask, sterile gown, sterile gloves, large sterile sheet, and Chlorhexidine as cutaneous antisepsis. The region was infiltrated locally with 1% lidocaine. Under real-time ultrasound guidance, the basilic vein was accessed with a 21 gauge micropuncture needle; the needle tip within the vein was confirmed with ultrasound image documentation. Needle exchanged over a 018 guidewire for a peel-away sheath, through which a 5-French double-lumen power injectable PICC trimmed to 38cm was advanced, positioned with its tip near the cavoatrial junction. Spot chest radiograph confirms appropriate catheter position. Catheter was flushed  per protocol and secured externally. The patient tolerated procedure well. COMPLICATIONS: COMPLICATIONS none IMPRESSION: 1. Technically successful five [place] double lumen power injectable PICC placement Electronically Signed   By: Lucrezia Europe M.D.   On: 10/28/2015 13:33   Dg Chest Port 1 View  10/25/2015  CLINICAL DATA:  Sickle cell pain.  Shortness of breath. EXAM: PORTABLE CHEST 1 VIEW COMPARISON:  Multiple previous chest x-rays. The most recent is 10/17/2015. FINDINGS: The heart is mildly enlarged but stable. There is moderate vascular congestion and probable mild interstitial edema. No infiltrates or effusions. The bony thorax is intact. Stable  changes of bilateral humeral head AVN. IMPRESSION: Cardiac enlargement with moderate vascular congestion and mild interstitial edema. Electronically Signed   By: Marijo Sanes M.D.   On: 10/25/2015 18:45   Dg Chest Portable 1 View  10/17/2015  CLINICAL DATA:  Sickle cell crisis. EXAM: PORTABLE CHEST 1 VIEW COMPARISON:  None. FINDINGS: Mediastinum and hilar structures normal. Cardiomegaly. No evidence of overt congestive heart failure. No focal infiltrate. No pleural effusion or pneumothorax. 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 IMPRESSION: 1. Cardiomegaly. No evidence of overt congestive heart failure. No focal pulmonary infiltrate. 2. 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 sodium chloride   Intravenous Once  . ciprofloxacin  250 mg Oral BID  . enoxaparin (LOVENOX) injection  40 mg Subcutaneous Q24H  . folic acid  1 mg Oral Daily  . HYDROmorphone   Intravenous 6 times per day  . ketorolac  30 mg Intravenous 4 times per day  . loratadine  10 mg Oral Daily  . morphine  15 mg Oral Q12H  . multivitamin with minerals  1 tablet Oral Daily  . senna-docusate  1 tablet Oral BID   Continuous Infusions: . dextrose 5 % and 0.45% NaCl 10 mL/hr at 10/28/15 1418    Principal Problem:   Sickle cell disease with crisis [facility]) Active Problems:   Sickle cell anemia (HCC)   Tachycardia   Anemia of chronic disease

## 2015-10-29 NOTE — Progress Notes (Signed)
VASCULAR LAB PRELIMINARY  PRELIMINARY  PRELIMINARY  PRELIMINARY  Right upper extremity venous duplex completed.    Preliminary report:  There is no DVT or SVT noted in the right upper extremity.  PICC is well visualized.  Jaquarius Seder, RVT 10/29/2015, 3:58 PM

## 2015-10-30 LAB — CBC WITH DIFFERENTIAL/PLATELET
BASOS ABS: 0.1 10*3/uL (ref 0.0–0.1)
BASOS PCT: 1 %
EOS PCT: 2 %
Eosinophils Absolute: 0.1 10*3/uL (ref 0.0–0.7)
HEMATOCRIT: 21.1 % — AB (ref 36.0–46.0)
HEMOGLOBIN: 7.4 g/dL — AB (ref 12.0–15.0)
LYMPHS PCT: 24 %
Lymphs Abs: 1.6 10*3/uL (ref 0.7–4.0)
MCH: 34.7 pg — AB (ref 26.0–34.0)
MCHC: 35.1 g/dL (ref 30.0–36.0)
MCV: 99.1 fL (ref 78.0–100.0)
MONOS PCT: 17 %
Monocytes Absolute: 1.2 10*3/uL — ABNORMAL HIGH (ref 0.1–1.0)
NEUTROS PCT: 56 %
NRBC: 132 /100{WBCs} — AB
Neutro Abs: 3.8 10*3/uL (ref 1.7–7.7)
Platelets: 111 10*3/uL — ABNORMAL LOW (ref 150–400)
RBC: 2.13 MIL/uL — ABNORMAL LOW (ref 3.87–5.11)
RDW: 27.7 % — ABNORMAL HIGH (ref 11.5–15.5)
WBC: 6.8 10*3/uL (ref 4.0–10.5)

## 2015-10-30 LAB — TYPE AND SCREEN
ABO/RH(D): A POS
ANTIBODY SCREEN: POSITIVE
DAT, IGG: NEGATIVE
UNIT DIVISION: 0
Unit division: 0

## 2015-10-30 LAB — BASIC METABOLIC PANEL
ANION GAP: 4 — AB (ref 5–15)
BUN: 8 mg/dL (ref 6–20)
CALCIUM: 8.3 mg/dL — AB (ref 8.9–10.3)
CO2: 27 mmol/L (ref 22–32)
Chloride: 113 mmol/L — ABNORMAL HIGH (ref 101–111)
Creatinine, Ser: 0.36 mg/dL — ABNORMAL LOW (ref 0.44–1.00)
GFR calc Af Amer: >60 mL/min (ref >60)
GLUCOSE: 103 mg/dL — AB (ref 65–99)
POTASSIUM: 3.8 mmol/L (ref 3.5–5.1)
SODIUM: 144 mmol/L (ref 135–145)

## 2015-10-30 LAB — LACTATE DEHYDROGENASE: LDH: 442 U/L — AB (ref 98–192)

## 2015-10-30 LAB — RETICULOCYTES
RBC.: 2.13 MIL/uL — ABNORMAL LOW (ref 3.87–5.11)
Retic Ct Pct: >23 % — ABNORMAL HIGH (ref 0.4–3.1)

## 2015-10-30 MED ORDER — HYDROMORPHONE HCL 2 MG/ML IJ SOLN
2.0000 mg | INTRAMUSCULAR | Status: DC | PRN
  Administered 2015-10-30 – 2015-10-31 (×6): 2 mg via INTRAVENOUS
  Filled 2015-10-30 (×6): qty 1

## 2015-10-30 NOTE — Progress Notes (Signed)
PHARMACY BRIEF NOTE: HYDROXYUREA   By [facility] policy, hydroxyurea was automatically held on 2/3 when Hgb was < 6. Now that Hgb has improved (7.4 today), medication could be resumed - MD order is required.   Peggyann Juba, PharmD, BCPS Pager: [phone] 10/30/2015 11:39 AM

## 2015-10-30 NOTE — Progress Notes (Addendum)
Patient ID: Dawn [name], female   DOB: 1981/05/23, 35 y.o.   MRN: [id number] SICKLE CELL SERVICE PROGRESS NOTE  Dawn [name] [id number] DOB: 07/22/1981 DOA: 10/25/2015 PCP: Dawn Chessman, MD  Assessment/Plan: Principal Problem:   Sickle cell disease with crisis ([place]) Active Problems:   Sickle cell anemia (HCC)   Tachycardia   Anemia of chronic disease 1.  1. Hb SS with crisis: Patient still has pain, currently rated at 8 out of 10, made worse by right upper limb swelling since insertion of PICC line. Continue pain medication PCA pump + scheduled clinician assisted doses. Patient has had a total of 5 days IV ketorolac, will discontinue. Restart hydroxyurea now the hemoglobin is above 7. 2. Anemia: S/P blood transfusion, repeat Hb is 7.5  3. Chronic pain:Continue MS Contin 4. Phlebitis RUL PICC Vs DVT: VAS [place] UPPER EXT VENOUS DUPLEX RIGHT showed no DVT, no superficial thrombosis involving the right upper extremity. Observe  Code Status: Full Code Family Communication: N/A Disposition Plan: Not yet ready for discharge  Dawn [name]  If 7PM-7AM, please contact night-coverage.  10/30/2015, 4:23 PM  LOS: 4 days   Admitting History: Dawn [name] is a 35 y.o. female with h/o HGB SS disease, patient presents to ED with pain crisis. Pain located in low back, BLE, BUE. 9/10 intensity, typical for pain crisis.Taking MS contin and oxycodone without relief of pain. No CP, no SOB, no cough, fever, chills, N/V.  Consultants:  None  Procedures:  PICC line, Right   Antibiotics:  None  HPI/Subjective: Right upper limb swelling is much improved, she has no fever, PICC line is functional, no bruises, no redness. Sickle cell pain is improved but still not at baseline. Hemoglobin is 7.4, stable. Patient is able to ambulate.  Objective: Filed Vitals:   10/30/15 0747 10/30/15 1000 10/30/15 1202 10/30/15 1410  BP:  127/65  130/69  Pulse:  86  88  Temp:  98.3 F (36.8 C)  98 F (36.7  C)  TempSrc:  Oral  Oral  Resp: 14 15 12 14   Height:      Weight:      SpO2: 100% 99% 100% 100%   Weight change:   Intake/Output Summary (Last 24 hours) at 10/30/15 1623 Last data filed at 10/30/15 1230  Gross per 24 hour  Intake    960 ml  Output      0 ml  Net    960 ml    General: Alert, awake, oriented x3, in no acute distress.  HEENT: [place]/AT PEERL, EOMI Neck: Trachea midline,  no masses, no thyromegal,y no JVD, no carotid bruit OROPHARYNX:  Moist, No exudate/ erythema/lesions.  Heart: Regular rate and rhythm, without murmurs, rubs, gallops, PMI non-displaced, no heaves or thrills on palpation.  Lungs: Clear to auscultation, no wheezing or rhonchi noted. No increased vocal fremitus resonant to percussion  Abdomen: Soft, nontender, nondistended, positive bowel sounds, no masses no hepatosplenomegaly noted..  Neuro: No focal neurological deficits noted cranial nerves II through XII grossly intact. DTRs 2+ bilaterally upper and lower extremities. Strength 5 out of 5 in bilateral upper and lower extremities. Musculoskeletal: Right hand swelling is much reduced, no erythema. No warm swelling or erythema around joints, no spinal tenderness noted. Psychiatric: Patient alert and oriented x3, good insight and cognition, good recent to remote recall. Lymph node survey: No cervical axillary or inguinal lymphadenopathy noted.   Data Reviewed: Basic Metabolic Panel:  Recent Labs Lab 10/25/15 1531 10/29/15 1145 10/30/15 0500  NA 136 145 144  K 3.6 3.5 3.8  CL 105 113* 113*  CO2 23 25 27   GLUCOSE 121* 100* 103*  BUN 7 8 8   CREATININE 0.47 0.38* 0.36*  CALCIUM 8.6* 8.1* 8.3*   Liver Function Tests:  Recent Labs Lab 10/25/15 1531  AST 89*  ALT 32  ALKPHOS 161*  BILITOT 9.3*  PROT 7.9  ALBUMIN 3.0*   No results for input(s): LIPASE, AMYLASE in the last 168 hours. No results for input(s): AMMONIA in the last 168 hours. CBC:  Recent Labs Lab 10/25/15 1531  10/27/15 1025 10/28/15 0356 10/29/15 1145 10/30/15 0500  WBC 8.0 12.5* 13.4* 8.2 6.8  NEUTROABS 4.3 7.4 7.4 4.8 3.8  HGB 7.9* 6.0* 5.1* 7.5* 7.4*  HCT 21.9* 16.4* 14.3* 21.2* 21.1*  MCV 97.3 97.0 97.3 97.2 99.1  PLT PLATELET CLUMPS NOTED ON SMEAR, COUNT APPEARS ADEQUATE 105* PLATELET CLUMPS NOTED ON SMEAR, COUNT APPEARS DECREASED 110* 111*   Cardiac Enzymes: No results for input(s): CKTOTAL, CKMB, CKMBINDEX, TROPONINI in the last 168 hours. BNP (last 3 results) No results for input(s): BNP in the last 8760 hours.  ProBNP (last 3 results) No results for input(s): PROBNP in the last 8760 hours.  CBG: No results for input(s): GLUCAP in the last 168 hours.  No results found for this or any previous visit (from the past 240 hour(s)).   Studies: Ir Fluoro Guide Cv Line Right  10/28/2015  CLINICAL DATA:  Sickle cell crisis, poor peripheral IV access. Needs long-term venous access. EXAM: PICC PLACEMENT WITH ULTRASOUND AND FLUOROSCOPY FLUOROSCOPY TIME:  0.9 minutes, 164 uGym2 DAP TECHNIQUE: After written informed consent was obtained, patient was placed in the supine position on angiographic table. Patency of the basilic vein was confirmed with ultrasound with image documentation. An appropriate skin site was determined. Skin site was marked. Region was prepped using maximum barrier technique including cap and mask, sterile gown, sterile gloves, large sterile sheet, and Chlorhexidine as cutaneous antisepsis. The region was infiltrated locally with 1% lidocaine. Under real-time ultrasound guidance, the basilic vein was accessed with a 21 gauge micropuncture needle; the needle tip within the vein was confirmed with ultrasound image documentation. Needle exchanged over a 018 guidewire for a peel-away sheath, through which a 5-French double-lumen power injectable PICC trimmed to 38cm was advanced, positioned with its tip near the cavoatrial junction. Spot chest radiograph confirms appropriate catheter  position. Catheter was flushed per protocol and secured externally. The patient tolerated procedure well. COMPLICATIONS: COMPLICATIONS none IMPRESSION: 1. Technically successful five [place] double lumen power injectable PICC placement Electronically Signed   By: Lucrezia Europe M.D.   On: 10/28/2015 13:33   Ir US Guide Vasc Access Right  10/28/2015  CLINICAL DATA:  Sickle cell crisis, poor peripheral IV access. Needs long-term venous access. EXAM: PICC PLACEMENT WITH ULTRASOUND AND FLUOROSCOPY FLUOROSCOPY TIME:  0.9 minutes, 164 uGym2 DAP TECHNIQUE: After written informed consent was obtained, patient was placed in the supine position on angiographic table. Patency of the basilic vein was confirmed with ultrasound with image documentation. An appropriate skin site was determined. Skin site was marked. Region was prepped using maximum barrier technique including cap and mask, sterile gown, sterile gloves, large sterile sheet, and Chlorhexidine as cutaneous antisepsis. The region was infiltrated locally with 1% lidocaine. Under real-time ultrasound guidance, the basilic vein was accessed with a 21 gauge micropuncture needle; the needle tip within the vein was confirmed with ultrasound image documentation. Needle exchanged over a 018 guidewire for a  peel-away sheath, through which a 5-French double-lumen power injectable PICC trimmed to 38cm was advanced, positioned with its tip near the cavoatrial junction. Spot chest radiograph confirms appropriate catheter position. Catheter was flushed per protocol and secured externally. The patient tolerated procedure well. COMPLICATIONS: COMPLICATIONS none IMPRESSION: 1. Technically successful five [place] double lumen power injectable PICC placement Electronically Signed   By: Lucrezia Europe M.D.   On: 10/28/2015 13:33   Dg Chest Port 1 View  10/25/2015  CLINICAL DATA:  Sickle cell pain.  Shortness of breath. EXAM: PORTABLE CHEST 1 VIEW COMPARISON:  Multiple previous chest x-rays.  The most recent is 10/17/2015. FINDINGS: The heart is mildly enlarged but stable. There is moderate vascular congestion and probable mild interstitial edema. No infiltrates or effusions. The bony thorax is intact. 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 IMPRESSION: Cardiac enlargement with moderate vascular congestion and mild interstitial edema. Electronically Signed   By: Marijo Sanes M.D.   On: 10/25/2015 18:45   Dg Chest Portable 1 View  10/17/2015  CLINICAL DATA:  Sickle cell crisis. EXAM: PORTABLE CHEST 1 VIEW COMPARISON:  None. FINDINGS: Mediastinum and hilar structures normal. Cardiomegaly. No evidence of overt congestive heart failure. No focal infiltrate. No pleural effusion or pneumothorax. 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 IMPRESSION: 1. Cardiomegaly. No evidence of overt congestive heart failure. No focal pulmonary infiltrate. 2. 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 sodium chloride   Intravenous Once  . enoxaparin (LOVENOX) injection  40 mg Subcutaneous Q24H  . folic acid  1 mg Oral Daily  . HYDROmorphone   Intravenous 6 times per day  . ketorolac  30 mg Intravenous 4 times per day  . loratadine  10 mg Oral Daily  . morphine  15 mg Oral Q12H  . multivitamin with minerals  1 tablet Oral Daily  . senna-docusate  1 tablet Oral BID   Continuous Infusions: . dextrose 5 % and 0.45% NaCl 10 mL/hr at 10/28/15 1418    Principal Problem:   Sickle cell disease with crisis [facility]) Active Problems:   Sickle cell anemia (HCC)   Tachycardia   Anemia of chronic disease

## 2015-10-31 DIAGNOSIS — K5909 Other constipation: Secondary | ICD-10-CM

## 2015-10-31 LAB — CBC WITH DIFFERENTIAL/PLATELET
BASOS ABS: 0 10*3/uL (ref 0.0–0.1)
Basophils Relative: 0 %
EOS ABS: 0 10*3/uL (ref 0.0–0.7)
Eosinophils Relative: 0 %
HCT: 19.9 % — ABNORMAL LOW (ref 36.0–46.0)
HEMOGLOBIN: 7 g/dL — AB (ref 12.0–15.0)
LYMPHS PCT: 24 %
Lymphs Abs: 1.7 10*3/uL (ref 0.7–4.0)
MCH: 35.4 pg — AB (ref 26.0–34.0)
MCHC: 35.2 g/dL (ref 30.0–36.0)
MCV: 100.5 fL — ABNORMAL HIGH (ref 78.0–100.0)
MONOS PCT: 14 %
Monocytes Absolute: 1 10*3/uL (ref 0.1–1.0)
NRBC: 164 /100{WBCs} — AB
Neutro Abs: 4.5 10*3/uL (ref 1.7–7.7)
Neutrophils Relative %: 62 %
Platelets: 90 10*3/uL — ABNORMAL LOW (ref 150–400)
RBC: 1.98 MIL/uL — AB (ref 3.87–5.11)
RDW: 27.6 % — ABNORMAL HIGH (ref 11.5–15.5)
WBC: 7.2 10*3/uL (ref 4.0–10.5)

## 2015-10-31 MED ORDER — HYDROMORPHONE HCL 2 MG/ML IJ SOLN
2.0000 mg | INTRAMUSCULAR | Status: DC
  Administered 2015-10-31 – 2015-11-04 (×32): 2 mg via INTRAVENOUS
  Filled 2015-10-31 (×33): qty 1

## 2015-10-31 MED ORDER — MAGNESIUM CITRATE PO SOLN
1.0000 | Freq: Once | ORAL | Status: AC
  Administered 2015-10-31: 1 via ORAL
  Filled 2015-10-31: qty 296

## 2015-10-31 NOTE — Progress Notes (Signed)
SICKLE CELL SERVICE PROGRESS NOTE  Dawn [name] [id number] DOB: 1980-12-27 DOA: 10/25/2015 PCP: Angelica Chessman, MD  Assessment/Plan: Principal Problem:   Sickle cell disease with crisis ([place]) Active Problems:   Sickle cell anemia (HCC)   Tachycardia   Anemia of chronic disease  1. Hb SS with crisis:  Pt has used 28.18 mg  With 69/49:demans/deliveries in last 24 hours. She has also used 12 mg in clinician assisted doses of Dilaudid. Will icontinue PCA bolus to 0.6 mg and continue clinician assisted doses on a PRN basis except at night when I will schedule doses. Re-assess pain control tomorrow. 2. Tachycardia: Resolved after transfusion. 3. Anemia of chronic disease. Hb decreased to 5.1. Pt  received transfusion of 2 units of RBC's, and Hb now at 7 g/dl. 4. Chronic pain: Continue MS Contin 15 mg.  5. Sickle Cell Disease: Currently patient is on a sub-therapeutic dose of Hydrea which is held due to low Hb. Will resume Hydrea 1000 mg daily. 6. Suspect UTI: Repeat U/A shows nitrites and WBC's. Completed 3 days of Ciprofloxacin.  Code Status: Full Code Family Communication: Mother and sister at bedside and updated. Disposition Plan: Not yet ready for discharge  [name],[name]  Pager [phone]. If 7PM-7AM, please contact night-coverage.  10/31/2015, 11:16 AM  LOS: 5 days   Interim History:  Pt states that pain has been present for 2 weeks. She was seen in acute out-patient settings twice in the 2 weeks prior to hospitalization and still has not had any sustained relief of her pain. Pt has used 31.79 mg  With 81/64:demands/deliveries  with an additional  14 mg in clinician assisted doses of Dilaudid in last 24 hours. Currently her pain is at 8/10 and localized to legs, arms and feet. She reports that the pain decreases down to 6/10 and then overnight goes up to 8/10.   Consultants:  None  Procedures:  None  Antibiotics:  None   Objective: Filed Vitals:   10/31/15 0425  10/31/15 0529 10/31/15 0751 10/31/15 0905  BP:  119/69  142/78  Pulse:  88  96  Temp:  98.3 F (36.8 C)  98.7 F (37.1 C)  TempSrc:  Oral  Oral  Resp: 14 14 19 16   Height:      Weight:      SpO2: 98% 100% 100% 100%   Weight change:   Intake/Output Summary (Last 24 hours) at 10/31/15 1116 Last data filed at 10/30/15 1823  Gross per 24 hour  Intake    840 ml  Output      0 ml  Net    840 ml    General: Alert, awake, oriented x3, in moderate distress.  HEENT: [place]/AT PEERL, EOMI, mild icterus Neck: Trachea midline,  no masses, no thyromegal,y no JVD, no carotid bruit OROPHARYNX:  Moist, No exudate/ erythema/lesions.  Heart: Regular rate and rhythm, without murmurs, rubs, gallops, PMI non-displaced, no heaves or thrills on palpation.  Lungs: Clear to auscultation, no wheezing or rhonchi noted. No increased vocal fremitus resonant to percussion  Abdomen: Soft, nontender, nondistended, positive bowel sounds, no masses no hepatosplenomegaly noted.  Neuro: No focal neurological deficits noted cranial nerves II through XII grossly intact.  Strength at functional baseline in bilateral upper and lower extremities. Musculoskeletal: No warm swelling or erythema around joints, no spinal tenderness noted. Psychiatric: Patient alert and oriented x3, good insight and cognition, good recent to remote recall. Melancohly affect. Lymph node survey: No cervical axillary or inguinal lymphadenopathy noted.  Data Reviewed: Basic Metabolic Panel:  Recent Labs Lab 10/25/15 1531 10/29/15 1145 10/30/15 0500  NA 136 145 144  K 3.6 3.5 3.8  CL 105 113* 113*  CO2 23 25 27   GLUCOSE 121* 100* 103*  BUN 7 8 8   CREATININE 0.47 0.38* 0.36*  CALCIUM 8.6* 8.1* 8.3*   Liver Function Tests:  Recent Labs Lab 10/25/15 1531  AST 89*  ALT 32  ALKPHOS 161*  BILITOT 9.3*  PROT 7.9  ALBUMIN 3.0*   No results for input(s): LIPASE, AMYLASE in the last 168 hours. No results for input(s): AMMONIA in the  last 168 hours. CBC:  Recent Labs Lab 10/27/15 1025 10/28/15 0356 10/29/15 1145 10/30/15 0500 10/31/15 0435  WBC 12.5* 13.4* 8.2 6.8 7.2  NEUTROABS 7.4 7.4 4.8 3.8 4.5  HGB 6.0* 5.1* 7.5* 7.4* 7.0*  HCT 16.4* 14.3* 21.2* 21.1* 19.9*  MCV 97.0 97.3 97.2 99.1 100.5*  PLT 105* PLATELET CLUMPS NOTED ON SMEAR, COUNT APPEARS DECREASED 110* 111* 90*   Cardiac Enzymes: No results for input(s): CKTOTAL, CKMB, CKMBINDEX, TROPONINI in the last 168 hours. BNP (last 3 results) No results for input(s): BNP in the last 8760 hours.  ProBNP (last 3 results) No results for input(s): PROBNP in the last 8760 hours.  CBG: No results for input(s): GLUCAP in the last 168 hours.  No results found for this or any previous visit (from the past 240 hour(s)).   Studies: Ir Fluoro Guide Cv Line Right  10/28/2015  CLINICAL DATA:  Sickle cell crisis, poor peripheral IV access. Needs long-term venous access. EXAM: PICC PLACEMENT WITH ULTRASOUND AND FLUOROSCOPY FLUOROSCOPY TIME:  0.9 minutes, 164 uGym2 DAP TECHNIQUE: After written informed consent was obtained, patient was placed in the supine position on angiographic table. Patency of the basilic vein was confirmed with ultrasound with image documentation. An appropriate skin site was determined. Skin site was marked. Region was prepped using maximum barrier technique including cap and mask, sterile gown, sterile gloves, large sterile sheet, and Chlorhexidine as cutaneous antisepsis. The region was infiltrated locally with 1% lidocaine. Under real-time ultrasound guidance, the basilic vein was accessed with a 21 gauge micropuncture needle; the needle tip within the vein was confirmed with ultrasound image documentation. Needle exchanged over a 018 guidewire for a peel-away sheath, through which a 5-French double-lumen power injectable PICC trimmed to 38cm was advanced, positioned with its tip near the cavoatrial junction. Spot chest radiograph confirms appropriate  catheter position. Catheter was flushed per protocol and secured externally. The patient tolerated procedure well. COMPLICATIONS: COMPLICATIONS none IMPRESSION: 1. Technically successful five [place] double lumen power injectable PICC placement Electronically Signed   By: Lucrezia Europe M.D.   On: 10/28/2015 13:33   Ir US Guide Vasc Access Right  10/28/2015  CLINICAL DATA:  Sickle cell crisis, poor peripheral IV access. Needs long-term venous access. EXAM: PICC PLACEMENT WITH ULTRASOUND AND FLUOROSCOPY FLUOROSCOPY TIME:  0.9 minutes, 164 uGym2 DAP TECHNIQUE: After written informed consent was obtained, patient was placed in the supine position on angiographic table. Patency of the basilic vein was confirmed with ultrasound with image documentation. An appropriate skin site was determined. Skin site was marked. Region was prepped using maximum barrier technique including cap and mask, sterile gown, sterile gloves, large sterile sheet, and Chlorhexidine as cutaneous antisepsis. The region was infiltrated locally with 1% lidocaine. Under real-time ultrasound guidance, the basilic vein was accessed with a 21 gauge micropuncture needle; the needle tip within the vein was confirmed with ultrasound image  documentation. Needle exchanged over a 018 guidewire for a peel-away sheath, through which a 5-French double-lumen power injectable PICC trimmed to 38cm was advanced, positioned with its tip near the cavoatrial junction. Spot chest radiograph confirms appropriate catheter position. Catheter was flushed per protocol and secured externally. The patient tolerated procedure well. COMPLICATIONS: COMPLICATIONS none IMPRESSION: 1. Technically successful five [place] double lumen power injectable PICC placement Electronically Signed   By: Lucrezia Europe M.D.   On: 10/28/2015 13:33   Dg Chest Port 1 View  10/25/2015  CLINICAL DATA:  Sickle cell pain.  Shortness of breath. EXAM: PORTABLE CHEST 1 VIEW COMPARISON:  Multiple previous chest  x-rays. The most recent is 10/17/2015. FINDINGS: The heart is mildly enlarged but stable. There is moderate vascular congestion and probable mild interstitial edema. No infiltrates or effusions. The bony thorax is intact. 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 IMPRESSION: Cardiac enlargement with moderate vascular congestion and mild interstitial edema. Electronically Signed   By: Marijo Sanes M.D.   On: 10/25/2015 18:45   Dg Chest Portable 1 View  10/17/2015  CLINICAL DATA:  Sickle cell crisis. EXAM: PORTABLE CHEST 1 VIEW COMPARISON:  None. FINDINGS: Mediastinum and hilar structures normal. Cardiomegaly. No evidence of overt congestive heart failure. No focal infiltrate. No pleural effusion or pneumothorax. 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 IMPRESSION: 1. Cardiomegaly. No evidence of overt congestive heart failure. No focal pulmonary infiltrate. 2. 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 sodium chloride   Intravenous Once  . enoxaparin (LOVENOX) injection  40 mg Subcutaneous Q24H  . folic acid  1 mg Oral Daily  . HYDROmorphone   Intravenous 6 times per day  . loratadine  10 mg Oral Daily  . morphine  15 mg Oral Q12H  . multivitamin with minerals  1 tablet Oral Daily  . senna-docusate  1 tablet Oral BID   Continuous Infusions: . dextrose 5 % and 0.45% NaCl 10 mL/hr at 10/28/15 1418    Time spent 30 minutes.

## 2015-10-31 NOTE — Care Management Important Message (Signed)
Important Message  Patient Details  Name: JOY BACHER MRN: [id number] Date of Birth: 15-Dec-1980   Medicare Important Message Given:  Yes    Camillo Flaming 10/31/2015, 9:35 AMImportant Message  Patient Details  Name: MAGDELYN BELTRAMI MRN: [id number] Date of Birth: 04/10/1981   Medicare Important Message Given:  Yes    Camillo Flaming 10/31/2015, 9:35 AM

## 2015-11-01 MED ORDER — FUROSEMIDE 20 MG PO TABS
20.0000 mg | ORAL_TABLET | Freq: Once | ORAL | Status: AC
  Administered 2015-11-01: 20 mg via ORAL
  Filled 2015-11-01: qty 1

## 2015-11-01 NOTE — Progress Notes (Signed)
Patient ambulatory entire length of unit.  O2 sats maintained between 90-95% without oxygen while ambulating.

## 2015-11-01 NOTE — Progress Notes (Signed)
SICKLE CELL SERVICE PROGRESS NOTE  Dawn [name] [id number] DOB: 11-14-1980 DOA: 10/25/2015 PCP: Angelica Chessman, MD  Assessment/Plan: Principal Problem:   Sickle cell disease with crisis ([place]) Active Problems:   Sickle cell anemia (HCC)   Tachycardia   Anemia of chronic disease  1. Hb SS with crisis:  Pt rates pain as 8/10 and reports that she has pain at rest and with ambulation in her LE's.However she is not maximally utilizing the PCA and has used 29.4 mg with 56/49:demans/deliveries in last 24 hours. She has also used 18 mg in clinician assisted doses of Dilaudid. It is unclear why patient is not utilizing the PCA more as she denies excessive sleepiness and reports that she has slept only about 5 of the last 24 hours. Encourage greater use of PCA. If no improvement by tomorrow will start to de-escalate treatment. 2. Tachycardia: Resolved after transfusion. 3. Anemia of chronic disease. Hb decreased to 5.1. Pt  received transfusion of 2 units of RBC's, and Hb now at 7 g/dl. 4. Chronic pain: Continue MS Contin 15 mg.  5. Sickle Cell Disease: Currently patient is on a sub-therapeutic dose of Hydrea which is held due to low Hb. Will resume Hydrea 1000 mg daily. 6. Suspect UTI: Repeat U/A shows nitrites and WBC's. Completed 3 days of Ciprofloxacin.  Code Status: Full Code Family Communication: N/A Disposition Plan: Not yet ready for discharge  [place].  Pager [phone]. If 7PM-7AM, please contact night-coverage.  11/01/2015, 1:16 PM  LOS: 6 days   Interim History:  Pt states that pain has been present for 2 weeks. She was seen in acute out-patient settings twice in the 2 weeks prior to hospitalization and still has not had any sustained relief of her pain. Pt has used 31.79 mg  With 81/64:demands/deliveries  with an additional  14 mg in clinician assisted doses of Dilaudid in last 24 hours. Currently her pain is at 8/10 and localized to legs, arms and feet. She reports that  the pain decreases down to 6/10 and then overnight goes up to 8/10.   Consultants:  None  Procedures:  None  Antibiotics:  None   Objective: Filed Vitals:   11/01/15 0500 11/01/15 0821 11/01/15 0928 11/01/15 1135  BP:   142/88   Pulse:   99   Temp:   99 F (37.2 C)   TempSrc:   Oral   Resp:  16 11 14   Height:      Weight: 175 lb (79.379 kg)     SpO2:  96% 96% 93%   Weight change:   Intake/Output Summary (Last 24 hours) at 11/01/15 1316 Last data filed at 11/01/15 1051  Gross per 24 hour  Intake 1328.62 ml  Output      0 ml  Net 1328.62 ml    General: Alert, awake, oriented x3, sitting in bed in mild distress but with flat affect.  HEENT: [place]/AT PEERL, EOMI, mild icterus Neck: Trachea midline,  no masses, no thyromegal,y no JVD, no carotid bruit OROPHARYNX:  Moist, No exudate/ erythema/lesions.  Heart: Regular rate and rhythm, without murmurs, rubs, gallops, PMI non-displaced, no heaves or thrills on palpation.  Lungs: Clear to auscultation, no wheezing or rhonchi noted. No increased vocal fremitus resonant to percussion  Abdomen: Soft, nontender, nondistended, positive bowel sounds, no masses no hepatosplenomegaly noted.  Neuro: No focal neurological deficits noted cranial nerves II through XII grossly intact.  Strength at functional baseline in bilateral upper and lower extremities. Musculoskeletal: No warm  swelling or erythema around joints, no spinal tenderness noted. Psychiatric: Patient alert and oriented x3, good insight and cognition, good recent to remote recall. Flat affect.    Data Reviewed: Basic Metabolic Panel:  Recent Labs Lab 10/25/15 1531 10/29/15 1145 10/30/15 0500  NA 136 145 144  K 3.6 3.5 3.8  CL 105 113* 113*  CO2 23 25 27   GLUCOSE 121* 100* 103*  BUN 7 8 8   CREATININE 0.47 0.38* 0.36*  CALCIUM 8.6* 8.1* 8.3*   Liver Function Tests:  Recent Labs Lab 10/25/15 1531  AST 89*  ALT 32  ALKPHOS 161*  BILITOT 9.3*  PROT 7.9   ALBUMIN 3.0*   No results for input(s): LIPASE, AMYLASE in the last 168 hours. No results for input(s): AMMONIA in the last 168 hours. CBC:  Recent Labs Lab 10/27/15 1025 10/28/15 0356 10/29/15 1145 10/30/15 0500 10/31/15 0435  WBC 12.5* 13.4* 8.2 6.8 7.2  NEUTROABS 7.4 7.4 4.8 3.8 4.5  HGB 6.0* 5.1* 7.5* 7.4* 7.0*  HCT 16.4* 14.3* 21.2* 21.1* 19.9*  MCV 97.0 97.3 97.2 99.1 100.5*  PLT 105* PLATELET CLUMPS NOTED ON SMEAR, COUNT APPEARS DECREASED 110* 111* 90*   Cardiac Enzymes: No results for input(s): CKTOTAL, CKMB, CKMBINDEX, TROPONINI in the last 168 hours. BNP (last 3 results) No results for input(s): BNP in the last 8760 hours.  ProBNP (last 3 results) No results for input(s): PROBNP in the last 8760 hours.  CBG: No results for input(s): GLUCAP in the last 168 hours.  No results found for this or any previous visit (from the past 240 hour(s)).   Studies: Ir Fluoro Guide Cv Line Right  10/28/2015  CLINICAL DATA:  Sickle cell crisis, poor peripheral IV access. Needs long-term venous access. EXAM: PICC PLACEMENT WITH ULTRASOUND AND FLUOROSCOPY FLUOROSCOPY TIME:  0.9 minutes, 164 uGym2 DAP TECHNIQUE: After written informed consent was obtained, patient was placed in the supine position on angiographic table. Patency of the basilic vein was confirmed with ultrasound with image documentation. An appropriate skin site was determined. Skin site was marked. Region was prepped using maximum barrier technique including cap and mask, sterile gown, sterile gloves, large sterile sheet, and Chlorhexidine as cutaneous antisepsis. The region was infiltrated locally with 1% lidocaine. Under real-time ultrasound guidance, the basilic vein was accessed with a 21 gauge micropuncture needle; the needle tip within the vein was confirmed with ultrasound image documentation. Needle exchanged over a 018 guidewire for a peel-away sheath, through which a 5-French double-lumen power injectable PICC  trimmed to 38cm was advanced, positioned with its tip near the cavoatrial junction. Spot chest radiograph confirms appropriate catheter position. Catheter was flushed per protocol and secured externally. The patient tolerated procedure well. COMPLICATIONS: COMPLICATIONS none IMPRESSION: 1. Technically successful five [place] double lumen power injectable PICC placement Electronically Signed   By: Lucrezia Europe M.D.   On: 10/28/2015 13:33   Ir US Guide Vasc Access Right  10/28/2015  CLINICAL DATA:  Sickle cell crisis, poor peripheral IV access. Needs long-term venous access. EXAM: PICC PLACEMENT WITH ULTRASOUND AND FLUOROSCOPY FLUOROSCOPY TIME:  0.9 minutes, 164 uGym2 DAP TECHNIQUE: After written informed consent was obtained, patient was placed in the supine position on angiographic table. Patency of the basilic vein was confirmed with ultrasound with image documentation. An appropriate skin site was determined. Skin site was marked. Region was prepped using maximum barrier technique including cap and mask, sterile gown, sterile gloves, large sterile sheet, and Chlorhexidine as cutaneous antisepsis. The region was infiltrated locally  with 1% lidocaine. Under real-time ultrasound guidance, the basilic vein was accessed with a 21 gauge micropuncture needle; the needle tip within the vein was confirmed with ultrasound image documentation. Needle exchanged over a 018 guidewire for a peel-away sheath, through which a 5-French double-lumen power injectable PICC trimmed to 38cm was advanced, positioned with its tip near the cavoatrial junction. Spot chest radiograph confirms appropriate catheter position. Catheter was flushed per protocol and secured externally. The patient tolerated procedure well. COMPLICATIONS: COMPLICATIONS none IMPRESSION: 1. Technically successful five [place] double lumen power injectable PICC placement Electronically Signed   By: Lucrezia Europe M.D.   On: 10/28/2015 13:33   Dg Chest Port 1  View  10/25/2015  CLINICAL DATA:  Sickle cell pain.  Shortness of breath. EXAM: PORTABLE CHEST 1 VIEW COMPARISON:  Multiple previous chest x-rays. The most recent is 10/17/2015. FINDINGS: The heart is mildly enlarged but stable. There is moderate vascular congestion and probable mild interstitial edema. No infiltrates or effusions. The bony thorax is intact. 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 IMPRESSION: Cardiac enlargement with moderate vascular congestion and mild interstitial edema. Electronically Signed   By: Marijo Sanes M.D.   On: 10/25/2015 18:45   Dg Chest Portable 1 View  10/17/2015  CLINICAL DATA:  Sickle cell crisis. EXAM: PORTABLE CHEST 1 VIEW COMPARISON:  None. FINDINGS: Mediastinum and hilar structures normal. Cardiomegaly. No evidence of overt congestive heart failure. No focal infiltrate. No pleural effusion or pneumothorax. 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 IMPRESSION: 1. Cardiomegaly. No evidence of overt congestive heart failure. No focal pulmonary infiltrate. 2. 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 sodium chloride   Intravenous Once  . enoxaparin (LOVENOX) injection  40 mg Subcutaneous Q24H  . folic acid  1 mg Oral Daily  . HYDROmorphone   Intravenous 6 times per day  .  HYDROmorphone (DILAUDID) injection  2 mg Intravenous Q3H  . loratadine  10 mg Oral Daily  . morphine  15 mg Oral Q12H  . multivitamin with minerals  1 tablet Oral Daily  . senna-docusate  1 tablet Oral BID   Continuous Infusions: . dextrose 5 % and 0.45% NaCl 10 mL/hr at 10/28/15 1418    Time spent 25 minutes.

## 2015-11-02 LAB — CBC WITH DIFFERENTIAL/PLATELET
BASOS ABS: 0 10*3/uL (ref 0.0–0.1)
BLASTS: 0 %
Band Neutrophils: 0 %
Basophils Relative: 0 %
EOS ABS: 0.5 10*3/uL (ref 0.0–0.7)
EOS PCT: 6 %
HEMATOCRIT: 22 % — AB (ref 36.0–46.0)
HEMOGLOBIN: 7.6 g/dL — AB (ref 12.0–15.0)
LYMPHS ABS: 3.2 10*3/uL (ref 0.7–4.0)
Lymphocytes Relative: 39 %
MCH: 34.7 pg — ABNORMAL HIGH (ref 26.0–34.0)
MCHC: 34.5 g/dL (ref 30.0–36.0)
MCV: 100.5 fL — ABNORMAL HIGH (ref 78.0–100.0)
METAMYELOCYTES PCT: 0 %
MONOS PCT: 8 %
MYELOCYTES: 1 %
Monocytes Absolute: 0.7 10*3/uL (ref 0.1–1.0)
NEUTROS PCT: 46 %
Neutro Abs: 3.9 10*3/uL (ref 1.7–7.7)
Other: 0 %
Platelets: 92 10*3/uL — ABNORMAL LOW (ref 150–400)
Promyelocytes Absolute: 0 %
RBC: 2.19 MIL/uL — AB (ref 3.87–5.11)
RDW: 24.4 % — AB (ref 11.5–15.5)
WBC: 8.3 10*3/uL (ref 4.0–10.5)
nRBC: 21 /100 WBC — ABNORMAL HIGH

## 2015-11-02 LAB — BASIC METABOLIC PANEL
Anion gap: 8 (ref 5–15)
BUN: 6 mg/dL (ref 6–20)
CHLORIDE: 106 mmol/L (ref 101–111)
CO2: 29 mmol/L (ref 22–32)
Calcium: 8.3 mg/dL — ABNORMAL LOW (ref 8.9–10.3)
Creatinine, Ser: 0.5 mg/dL (ref 0.44–1.00)
GFR calc non Af Amer: >60 mL/min (ref >60)
Glucose, Bld: 120 mg/dL — ABNORMAL HIGH (ref 65–99)
POTASSIUM: 3.8 mmol/L (ref 3.5–5.1)
SODIUM: 143 mmol/L (ref 135–145)

## 2015-11-02 LAB — RETICULOCYTES
RBC.: 2.19 MIL/uL — AB (ref 3.87–5.11)
RETIC COUNT ABSOLUTE: 449 10*3/uL — AB (ref 19.0–186.0)
RETIC CT PCT: 20.5 % — AB (ref 0.4–3.1)

## 2015-11-02 MED ORDER — HYDROXYUREA 500 MG PO CAPS
1000.0000 mg | ORAL_CAPSULE | Freq: Every day | ORAL | Status: DC
  Administered 2015-11-02 – 2015-11-04 (×3): 1000 mg via ORAL
  Filled 2015-11-02 (×3): qty 2

## 2015-11-02 MED ORDER — ENOXAPARIN SODIUM 40 MG/0.4ML ~~LOC~~ SOLN: 40.0000 mg | SUBCUTANEOUS | Status: DC

## 2015-11-02 NOTE — Progress Notes (Deleted)
Per post-interventional radiology procedure prophylactic Lovenox 40mg  sq q24h will be resumed at 22:00 tonight.   Romeo Rabon, PharmD, pager [phone]. 11/02/2015,2:50 PM.

## 2015-11-02 NOTE — Progress Notes (Signed)
SICKLE CELL SERVICE PROGRESS NOTE  Dawn [name] [id number] DOB: Jun 09, 1981 DOA: 10/25/2015 PCP: Angelica Chessman, MD  Assessment/Plan: Principal Problem:   Sickle cell disease with crisis ([place]) Active Problems:   Sickle cell anemia (HCC)   Tachycardia   Anemia of chronic disease  1. Hb SS with crisis:  Pain improved. Will discontinue clinician assisted doses. Schedule short acting opiates and continue PCA for PRN use. Anticipate discharge in 24-48 hours. 2. Tachycardia: Resolved after transfusion. 3. Anemia of chronic disease. Hb stable at 7.6 after transfusion of 2 unit RBC 5 days ago. 4. Chronic pain: Continue MS Contin 15 mg.  5. Sickle Cell Disease: Currently patient is on a sub-therapeutic dose of Hydrea which is held due to low Hb. Hb stable. Hydrea 1000 mg daily resumed. 6. Suspected UTI: Repeat U/A showed nitrites and WBC's. Completed 3 days of Ciprofloxacin.  Code Status: Full Code Family Communication: N/A Disposition Plan: Anticipate discharge in 1-2 days  Jazzalynn Rhudy A.  Pager [phone]. If 7PM-7AM, please contact night-coverage.  11/02/2015, 9:35 AM  LOS: 7 days   Interim History:  Pt states that she feels better today. She rates pain as 6/10 localized to legs. She has used 39 mg with 90/68: demands/deliveries.   Consultants:  None  Procedures:  None  Antibiotics:  None   Objective: Filed Vitals:   11/02/15 0414 11/02/15 0450 11/02/15 0500 11/02/15 0838  BP:  135/69    Pulse:  114    Temp:  98.2 F (36.8 C)    TempSrc:  Oral    Resp: 14 15  10   Height:      Weight:   171 lb 3.2 oz (77.656 kg)   SpO2: 94% 95%  92%   Weight change: -3 lb 12.8 oz (-1.724 kg)  Intake/Output Summary (Last 24 hours) at 11/02/15 0935 Last data filed at 11/02/15 0600  Gross per 24 hour  Intake   1364 ml  Output      0 ml  Net   1364 ml    General: Alert, awake, oriented x3, sitting in bed comfortably but with flat affect.  HEENT: /AT PEERL, EOMI,  anicteric Neck: Trachea midline,  no masses, no thyromegal,y no JVD, no carotid bruit OROPHARYNX:  Moist, No exudate/ erythema/lesions.  Heart: Regular rate and rhythm, without murmurs, rubs, gallops, PMI non-displaced, no heaves or thrills on palpation.  Lungs: Clear to auscultation, no wheezing or rhonchi noted. No increased vocal fremitus resonant to percussion  Abdomen: Soft, nontender, nondistended, positive bowel sounds, no masses no hepatosplenomegaly noted.  Neuro: No focal neurological deficits noted cranial nerves II through XII grossly intact.  Strength at functional baseline in bilateral upper and lower extremities. Musculoskeletal: No warm swelling or erythema around joints, no spinal tenderness noted. Psychiatric: Patient alert and oriented x3, good insight and cognition, good recent to remote recall. Flat affect.    Data Reviewed: Basic Metabolic Panel:  Recent Labs Lab 10/29/15 1145 10/30/15 0500 11/02/15 0408  NA 145 144 143  K 3.5 3.8 3.8  CL 113* 113* 106  CO2 25 27 29   GLUCOSE 100* 103* 120*  BUN 8 8 6   CREATININE 0.38* 0.36* 0.50  CALCIUM 8.1* 8.3* 8.3*   Liver Function Tests: No results for input(s): AST, ALT, ALKPHOS, BILITOT, PROT, ALBUMIN in the last 168 hours. No results for input(s): LIPASE, AMYLASE in the last 168 hours. No results for input(s): AMMONIA in the last 168 hours. CBC:  Recent Labs Lab 10/28/15 0356 10/29/15 1145 10/30/15 0500  10/31/15 0435 11/02/15 0408  WBC 13.4* 8.2 6.8 7.2 8.3  NEUTROABS 7.4 4.8 3.8 4.5 3.9  HGB 5.1* 7.5* 7.4* 7.0* 7.6*  HCT 14.3* 21.2* 21.1* 19.9* 22.0*  MCV 97.3 97.2 99.1 100.5* 100.5*  PLT PLATELET CLUMPS NOTED ON SMEAR, COUNT APPEARS DECREASED 110* 111* 90* 92*   Cardiac Enzymes: No results for input(s): CKTOTAL, CKMB, CKMBINDEX, TROPONINI in the last 168 hours. BNP (last 3 results) No results for input(s): BNP in the last 8760 hours.  ProBNP (last 3 results) No results for input(s): PROBNP in the  last 8760 hours.  CBG: No results for input(s): GLUCAP in the last 168 hours.  No results found for this or any previous visit (from the past 240 hour(s)).   Studies: Ir Fluoro Guide Cv Line Right  10/28/2015  CLINICAL DATA:  Sickle cell crisis, poor peripheral IV access. Needs long-term venous access. EXAM: PICC PLACEMENT WITH ULTRASOUND AND FLUOROSCOPY FLUOROSCOPY TIME:  0.9 minutes, 164 uGym2 DAP TECHNIQUE: After written informed consent was obtained, patient was placed in the supine position on angiographic table. Patency of the basilic vein was confirmed with ultrasound with image documentation. An appropriate skin site was determined. Skin site was marked. Region was prepped using maximum barrier technique including cap and mask, sterile gown, sterile gloves, large sterile sheet, and Chlorhexidine as cutaneous antisepsis. The region was infiltrated locally with 1% lidocaine. Under real-time ultrasound guidance, the basilic vein was accessed with a 21 gauge micropuncture needle; the needle tip within the vein was confirmed with ultrasound image documentation. Needle exchanged over a 018 guidewire for a peel-away sheath, through which a 5-French double-lumen power injectable PICC trimmed to 38cm was advanced, positioned with its tip near the cavoatrial junction. Spot chest radiograph confirms appropriate catheter position. Catheter was flushed per protocol and secured externally. The patient tolerated procedure well. COMPLICATIONS: COMPLICATIONS none IMPRESSION: 1. Technically successful five [place] double lumen power injectable PICC placement Electronically Signed   By: Lucrezia Europe M.D.   On: 10/28/2015 13:33   Ir US Guide Vasc Access Right  10/28/2015  CLINICAL DATA:  Sickle cell crisis, poor peripheral IV access. Needs long-term venous access. EXAM: PICC PLACEMENT WITH ULTRASOUND AND FLUOROSCOPY FLUOROSCOPY TIME:  0.9 minutes, 164 uGym2 DAP TECHNIQUE: After written informed consent was obtained,  patient was placed in the supine position on angiographic table. Patency of the basilic vein was confirmed with ultrasound with image documentation. An appropriate skin site was determined. Skin site was marked. Region was prepped using maximum barrier technique including cap and mask, sterile gown, sterile gloves, large sterile sheet, and Chlorhexidine as cutaneous antisepsis. The region was infiltrated locally with 1% lidocaine. Under real-time ultrasound guidance, the basilic vein was accessed with a 21 gauge micropuncture needle; the needle tip within the vein was confirmed with ultrasound image documentation. Needle exchanged over a 018 guidewire for a peel-away sheath, through which a 5-French double-lumen power injectable PICC trimmed to 38cm was advanced, positioned with its tip near the cavoatrial junction. Spot chest radiograph confirms appropriate catheter position. Catheter was flushed per protocol and secured externally. The patient tolerated procedure well. COMPLICATIONS: COMPLICATIONS none IMPRESSION: 1. Technically successful five [place] double lumen power injectable PICC placement Electronically Signed   By: Lucrezia Europe M.D.   On: 10/28/2015 13:33   Dg Chest Port 1 View  10/25/2015  CLINICAL DATA:  Sickle cell pain.  Shortness of breath. EXAM: PORTABLE CHEST 1 VIEW COMPARISON:  Multiple previous chest x-rays. The most recent is 10/17/2015. FINDINGS:  The heart is mildly enlarged but stable. There is moderate vascular congestion and probable mild interstitial edema. No infiltrates or effusions. The bony thorax is intact. 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 IMPRESSION: Cardiac enlargement with moderate vascular congestion and mild interstitial edema. Electronically Signed   By: Marijo Sanes M.D.   On: 10/25/2015 18:45   Dg Chest Portable 1 View  10/17/2015  CLINICAL DATA:  Sickle cell crisis. EXAM: PORTABLE CHEST 1 VIEW COMPARISON:  None. FINDINGS: Mediastinum and hilar structures  normal. Cardiomegaly. No evidence of overt congestive heart failure. No focal infiltrate. No pleural effusion or pneumothorax. 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 IMPRESSION: 1. Cardiomegaly. No evidence of overt congestive heart failure. No focal pulmonary infiltrate. 2. 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 sodium chloride   Intravenous Once  . folic acid  1 mg Oral Daily  . HYDROmorphone   Intravenous 6 times per day  .  HYDROmorphone (DILAUDID) injection  2 mg Intravenous Q3H  . loratadine  10 mg Oral Daily  . morphine  15 mg Oral Q12H  . multivitamin with minerals  1 tablet Oral Daily  . senna-docusate  1 tablet Oral BID   Continuous Infusions: . dextrose 5 % and 0.45% NaCl 10 mL/hr at 11/02/15 0415    Time spent 25 minutes.

## 2015-11-03 DIAGNOSIS — D696 Thrombocytopenia, unspecified: Secondary | ICD-10-CM

## 2015-11-03 MED ORDER — OXYCODONE HCL 5 MG PO TABS
10.0000 mg | ORAL_TABLET | ORAL | Status: DC
  Administered 2015-11-03 – 2015-11-04 (×8): 10 mg via ORAL
  Filled 2015-11-03 (×8): qty 2

## 2015-11-03 NOTE — Progress Notes (Signed)
SICKLE CELL SERVICE PROGRESS NOTE  Dawn [name] [id number] DOB: 09/08/1981 DOA: 10/25/2015 PCP: Angelica Chessman, MD  Assessment/Plan: Principal Problem:   Sickle cell disease with crisis ([place]) Active Problems:   Sickle cell anemia (HCC)   Tachycardia   Anemia of chronic disease  1. Hb SS with crisis:  Pain improved. Will discontinue clinician assisted doses. Schedule short acting opiates and continue PCA for PRN use. Anticipate discharge in 24 hours. 2. Tachycardia: Resolved after transfusion. 3. Anemia of chronic disease. Hb stable at 7.6 after transfusion of 2 unit RBC 5 days ago. 4. Chronic pain: Continue MS Contin 15 mg.  5. Sickle Cell Disease: Currently patient is on a sub-therapeutic dose of Hydrea which is held due to low Hb. Hb stable. Hydrea 1000 mg daily resumed. 6. Suspected UTI: Repeat U/A showed nitrites and WBC's. Completed 3 days of Ciprofloxacin.  Code Status: Full Code Family Communication: N/A Disposition Plan: Anticipate discharge in 1-2 days  Shaliah Wann A.  Pager [phone]. If 7PM-7AM, please contact night-coverage.  11/03/2015, 1:59 PM  LOS: 8 days   Interim History:  Pt states that she feels better today. She rates pain as 5/10 localized to legs.   Consultants:  None  Procedures:  None  Antibiotics:  None   Objective: Filed Vitals:   11/03/15 0417 11/03/15 0434 11/03/15 0853 11/03/15 0924  BP:  147/77  145/83  Pulse:  107  98  Temp:  98.8 F (37.1 C)  98.9 F (37.2 C)  TempSrc:  Oral  Oral  Resp: 17 20 20 13   Height:      Weight:  171 lb (77.565 kg)    SpO2: 100% 100% 98% 100%   Weight change: -3.2 oz (-0.091 kg)  Intake/Output Summary (Last 24 hours) at 11/03/15 1359 Last data filed at 11/03/15 0924  Gross per 24 hour  Intake    240 ml  Output      0 ml  Net    240 ml    General: Alert, awake, oriented x3, sitting in bed comfortably. Well appearing.Marland Kitchen  HEENT: [place]/AT PEERL, EOMI, anicteric Neck: Trachea midline,  no  masses, no thyromegal,y no JVD, no carotid bruit OROPHARYNX:  Moist, No exudate/ erythema/lesions.  Heart: Regular rate and rhythm, without murmurs, rubs, gallops, PMI non-displaced, no heaves or thrills on palpation.  Lungs: Clear to auscultation, no wheezing or rhonchi noted. No increased vocal fremitus resonant to percussion  Abdomen: Soft, nontender, nondistended, positive bowel sounds, no masses no hepatosplenomegaly noted.  Neuro: No focal neurological deficits noted cranial nerves II through XII grossly intact.  Strength at functional baseline in bilateral upper and lower extremities. Musculoskeletal: No warm swelling or erythema around joints, no spinal tenderness noted. Psychiatric: Patient alert and oriented x3, good insight and cognition, good recent to remote recall.     Data Reviewed: Basic Metabolic Panel:  Recent Labs Lab 10/29/15 1145 10/30/15 0500 11/02/15 0408  NA 145 144 143  K 3.5 3.8 3.8  CL 113* 113* 106  CO2 25 27 29   GLUCOSE 100* 103* 120*  BUN 8 8 6   CREATININE 0.38* 0.36* 0.50  CALCIUM 8.1* 8.3* 8.3*   Liver Function Tests: No results for input(s): AST, ALT, ALKPHOS, BILITOT, PROT, ALBUMIN in the last 168 hours. No results for input(s): LIPASE, AMYLASE in the last 168 hours. No results for input(s): AMMONIA in the last 168 hours. CBC:  Recent Labs Lab 10/28/15 0356 10/29/15 1145 10/30/15 0500 10/31/15 0435 11/02/15 0408  WBC 13.4* 8.2 6.8 7.2  8.3  NEUTROABS 7.4 4.8 3.8 4.5 3.9  HGB 5.1* 7.5* 7.4* 7.0* 7.6*  HCT 14.3* 21.2* 21.1* 19.9* 22.0*  MCV 97.3 97.2 99.1 100.5* 100.5*  PLT PLATELET CLUMPS NOTED ON SMEAR, COUNT APPEARS DECREASED 110* 111* 90* 92*   Cardiac Enzymes: No results for input(s): CKTOTAL, CKMB, CKMBINDEX, TROPONINI in the last 168 hours. BNP (last 3 results) No results for input(s): BNP in the last 8760 hours.  ProBNP (last 3 results) No results for input(s): PROBNP in the last 8760 hours.  CBG: No results for input(s):  GLUCAP in the last 168 hours.  No results found for this or any previous visit (from the past 240 hour(s)).   Studies: Ir Fluoro Guide Cv Line Right  10/28/2015  CLINICAL DATA:  Sickle cell crisis, poor peripheral IV access. Needs long-term venous access. EXAM: PICC PLACEMENT WITH ULTRASOUND AND FLUOROSCOPY FLUOROSCOPY TIME:  0.9 minutes, 164 uGym2 DAP TECHNIQUE: After written informed consent was obtained, patient was placed in the supine position on angiographic table. Patency of the basilic vein was confirmed with ultrasound with image documentation. An appropriate skin site was determined. Skin site was marked. Region was prepped using maximum barrier technique including cap and mask, sterile gown, sterile gloves, large sterile sheet, and Chlorhexidine as cutaneous antisepsis. The region was infiltrated locally with 1% lidocaine. Under real-time ultrasound guidance, the basilic vein was accessed with a 21 gauge micropuncture needle; the needle tip within the vein was confirmed with ultrasound image documentation. Needle exchanged over a 018 guidewire for a peel-away sheath, through which a 5-French double-lumen power injectable PICC trimmed to 38cm was advanced, positioned with its tip near the cavoatrial junction. Spot chest radiograph confirms appropriate catheter position. Catheter was flushed per protocol and secured externally. The patient tolerated procedure well. COMPLICATIONS: COMPLICATIONS none IMPRESSION: 1. Technically successful five [place] double lumen power injectable PICC placement Electronically Signed   By: Lucrezia Europe M.D.   On: 10/28/2015 13:33   Ir US Guide Vasc Access Right  10/28/2015  CLINICAL DATA:  Sickle cell crisis, poor peripheral IV access. Needs long-term venous access. EXAM: PICC PLACEMENT WITH ULTRASOUND AND FLUOROSCOPY FLUOROSCOPY TIME:  0.9 minutes, 164 uGym2 DAP TECHNIQUE: After written informed consent was obtained, patient was placed in the supine position on  angiographic table. Patency of the basilic vein was confirmed with ultrasound with image documentation. An appropriate skin site was determined. Skin site was marked. Region was prepped using maximum barrier technique including cap and mask, sterile gown, sterile gloves, large sterile sheet, and Chlorhexidine as cutaneous antisepsis. The region was infiltrated locally with 1% lidocaine. Under real-time ultrasound guidance, the basilic vein was accessed with a 21 gauge micropuncture needle; the needle tip within the vein was confirmed with ultrasound image documentation. Needle exchanged over a 018 guidewire for a peel-away sheath, through which a 5-French double-lumen power injectable PICC trimmed to 38cm was advanced, positioned with its tip near the cavoatrial junction. Spot chest radiograph confirms appropriate catheter position. Catheter was flushed per protocol and secured externally. The patient tolerated procedure well. COMPLICATIONS: COMPLICATIONS none IMPRESSION: 1. Technically successful five [place] double lumen power injectable PICC placement Electronically Signed   By: Lucrezia Europe M.D.   On: 10/28/2015 13:33   Dg Chest Port 1 View  10/25/2015  CLINICAL DATA:  Sickle cell pain.  Shortness of breath. EXAM: PORTABLE CHEST 1 VIEW COMPARISON:  Multiple previous chest x-rays. The most recent is 10/17/2015. FINDINGS: The heart is mildly enlarged but stable. There is moderate  vascular congestion and probable mild interstitial edema. No infiltrates or effusions. The bony thorax is intact. 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 IMPRESSION: Cardiac enlargement with moderate vascular congestion and mild interstitial edema. Electronically Signed   By: Marijo Sanes M.D.   On: 10/25/2015 18:45   Dg Chest Portable 1 View  10/17/2015  CLINICAL DATA:  Sickle cell crisis. EXAM: PORTABLE CHEST 1 VIEW COMPARISON:  None. FINDINGS: Mediastinum and hilar structures normal. Cardiomegaly. No evidence of overt  congestive heart failure. No focal infiltrate. No pleural effusion or pneumothorax. 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 IMPRESSION: 1. Cardiomegaly. No evidence of overt congestive heart failure. No focal pulmonary infiltrate. 2. Sclerotic changes in the right humeral head suggesting avascular necrosis Electronically Signed   By: [place]   On: 10/17/2015 08:45    Scheduled Meds: . sodium chloride   Intravenous Once  . folic acid  1 mg Oral Daily  .  HYDROmorphone (DILAUDID) injection  2 mg Intravenous Q3H  . hydroxyurea  1,000 mg Oral Daily  . loratadine  10 mg Oral Daily  . morphine  15 mg Oral Q12H  . multivitamin with minerals  1 tablet Oral Daily  . oxyCODONE  10 mg Oral Q4H  . senna-docusate  1 tablet Oral BID   Continuous Infusions: . dextrose 5 % and 0.45% NaCl 10 mL/hr at 11/02/15 0415    Time spent 25 minutes.

## 2015-11-04 DIAGNOSIS — F329 Major depressive disorder, single episode, unspecified: Secondary | ICD-10-CM

## 2015-11-04 DIAGNOSIS — D599 Acquired hemolytic anemia, unspecified: Secondary | ICD-10-CM

## 2015-11-04 LAB — RETICULOCYTES: RBC.: 1.88 MIL/uL — AB (ref 3.87–5.11)

## 2015-11-04 LAB — CBC WITH DIFFERENTIAL/PLATELET
BASOS ABS: 0 10*3/uL (ref 0.0–0.1)
Basophils Relative: 0 %
EOS ABS: 0.2 10*3/uL (ref 0.0–0.7)
EOS PCT: 2 %
HCT: 19.6 % — ABNORMAL LOW (ref 36.0–46.0)
HEMOGLOBIN: 6.7 g/dL — AB (ref 12.0–15.0)
LYMPHS PCT: 26 %
Lymphs Abs: 2.4 10*3/uL (ref 0.7–4.0)
MCH: 34.5 pg — ABNORMAL HIGH (ref 26.0–34.0)
MCHC: 34.2 g/dL (ref 30.0–36.0)
MCV: 101 fL — ABNORMAL HIGH (ref 78.0–100.0)
MONOS PCT: 18 %
Monocytes Absolute: 1.7 10*3/uL — ABNORMAL HIGH (ref 0.1–1.0)
NEUTROS PCT: 54 %
Neutro Abs: 5.1 10*3/uL (ref 1.7–7.7)
Platelets: 118 10*3/uL — ABNORMAL LOW (ref 150–400)
RBC: 1.94 MIL/uL — AB (ref 3.87–5.11)
RDW: 25.4 % — ABNORMAL HIGH (ref 11.5–15.5)
WBC: 9.4 10*3/uL (ref 4.0–10.5)
nRBC: 8 /100 WBC — ABNORMAL HIGH

## 2015-11-04 NOTE — Progress Notes (Signed)
Pt discharged home with family in stable condition. Discharge instructions given. Pt verbalized understanding 

## 2015-11-04 NOTE — Progress Notes (Signed)
CRITICAL VALUE ALERT  Critical value received:  HGB 6.7  Date of notification:  11/04/15  Time of notification:  0540  Critical value read back:Yes  Nurse who received alert: Azzie Glatter, RN  MD notified (1st page): [place]  Time of first page:  [phone]  MD notified (2nd page): n/a  Time of second page:n/a  Responding MD:  [place]  Time MD responded:  N/a (MD was sent detailed message. Rounding physician to address)

## 2015-11-04 NOTE — Progress Notes (Signed)
This CM met with pt to discuss medication issues.  Pt states she can't afford her MS Contin. She says that at [facility] they quoted her over $300 for a one month supply.  This CM called [facility] which quoted me $60 dollars for a one month supply.  I also found a discount card on [email or web address] for $36 for a one month supply at [facility] if she doesn't use her insurance at all.  Pt states she can afford the $36 dollars. Pt appreciative of CM assistance. Marney Doctor RN,BSN,NCM [phone]

## 2015-11-04 NOTE — Discharge Summary (Signed)
Dawn [name] MRN: [id number] DOB/AGE: [birth year] 35 y.o.  Admit date: 10/25/2015 Discharge date: 11/04/2015  Primary Care Physician:  Angelica Chessman, MD   Discharge Diagnoses:   Patient Active Problem List   Diagnosis Date Noted  . Tachycardia   . Anemia of chronic disease   . Chronic pain syndrome 10/14/2015  . UTI (urinary tract infection) 08/25/2015  . 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 Vitamin B12 deficiency 07/07/2015  . Acute pulmonary edema ([place]) 07/06/2015  . Thrombocytopenia ([place]) 07/06/2015  . Acute respiratory failure with hypoxia ([place]) 09/12/2014  . Hyperbilirubinemia 09/11/2014  . Sickle cell disease with crisis ([place]) 09/09/2014  . Anxiety state 06/15/2014  . Nosebleed 06/15/2014  . Macrocytosis 06/13/2014  . Sickle cell crisis ([place]) 06/12/2014  . Sickle cell anemia with crisis ([place]) 05/24/2014  . Vitamin D deficiency 11/23/2013  . Hb-SS disease without crisis ([place]) 11/23/2013  . Abnormal chest CT 08/07/2013  . Chest pain 08/03/2013  . Fungemia 07/28/2013  . Fever, unspecified 07/27/2013  . Hb-SS disease with acute chest syndrome ([place]) 05/19/2013  . Hypoxemia 05/14/2013  . 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 Hypokalemia 03/28/2012  . Hypoalbuminemia 03/28/2012  . 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 Leukocytosis 05/26/2011  . Elevated LFTs 04/27/2011  . Sickle cell anemia ([place]) 04/27/2011  . Sickle cell disease ([place]) 04/26/2011    DISCHARGE MEDICATION:   Medication List    TAKE these medications        folic acid 1 MG tablet  Commonly known as:  FOLVITE  Take 1 tablet (1 mg total) by mouth daily.     hydroxyurea 500 MG capsule  Commonly known as:  HYDREA  Take 2 capsules (1,000 mg total) by mouth daily. May take with food to minimize GI side effects.     ibuprofen 200 MG tablet  Commonly known as:  ADVIL,MOTRIN  Take 400 mg by mouth every 6 (six) hours as needed for moderate pain.     loratadine 10 MG tablet   Commonly known as:  CLARITIN  Take 10 mg by mouth daily.     morphine 15 MG 12 hr tablet  Commonly known as:  MS CONTIN  Take 1 tablet (15 mg total) by mouth every 12 (twelve) hours.     multivitamin with minerals Tabs tablet  Take 1 tablet by mouth daily.     Oxycodone HCl 10 MG Tabs  Take 1 tablet (10 mg total) by mouth every 6 (six) hours as needed.     promethazine 12.5 MG tablet  Commonly known as:  PHENERGAN  Take 1 tablet (12.5 mg total) by mouth every 6 (six) hours as needed for nausea.     Vitamin D (Ergocalciferol) 50000 units Caps capsule  Commonly known as:  DRISDOL  Take 1 capsule (50,000 Units total) by mouth every 7 (seven) days. Takes on Sundays.          Consults:     SIGNIFICANT DIAGNOSTIC STUDIES:  Ir Fluoro Guide Cv Line Right  10/28/2015  CLINICAL DATA:  Sickle cell crisis, poor peripheral IV access. Needs long-term venous access. EXAM: PICC PLACEMENT WITH ULTRASOUND AND FLUOROSCOPY FLUOROSCOPY TIME:  0.9 minutes, 164 uGym2 DAP TECHNIQUE: After written informed consent was obtained, patient was placed in the supine position on angiographic table. Patency of the basilic vein was confirmed with ultrasound with image documentation. An appropriate skin site was determined. Skin site was marked. Region was prepped using maximum barrier technique including cap and mask, sterile gown, sterile gloves,  large sterile sheet, and Chlorhexidine as cutaneous antisepsis. The region was infiltrated locally with 1% lidocaine. Under real-time ultrasound guidance, the basilic vein was accessed with a 21 gauge micropuncture needle; the needle tip within the vein was confirmed with ultrasound image documentation. Needle exchanged over a 018 guidewire for a peel-away sheath, through which a 5-French double-lumen power injectable PICC trimmed to 38cm was advanced, positioned with its tip near the cavoatrial junction. Spot chest radiograph confirms appropriate catheter position. Catheter  was flushed per protocol and secured externally. The patient tolerated procedure well. COMPLICATIONS: COMPLICATIONS none IMPRESSION: 1. Technically successful five [place] double lumen power injectable PICC placement Electronically Signed   By: Lucrezia Europe M.D.   On: 10/28/2015 13:33   Ir US Guide Vasc Access Right  10/28/2015  CLINICAL DATA:  Sickle cell crisis, poor peripheral IV access. Needs long-term venous access. EXAM: PICC PLACEMENT WITH ULTRASOUND AND FLUOROSCOPY FLUOROSCOPY TIME:  0.9 minutes, 164 uGym2 DAP TECHNIQUE: After written informed consent was obtained, patient was placed in the supine position on angiographic table. Patency of the basilic vein was confirmed with ultrasound with image documentation. An appropriate skin site was determined. Skin site was marked. Region was prepped using maximum barrier technique including cap and mask, sterile gown, sterile gloves, large sterile sheet, and Chlorhexidine as cutaneous antisepsis. The region was infiltrated locally with 1% lidocaine. Under real-time ultrasound guidance, the basilic vein was accessed with a 21 gauge micropuncture needle; the needle tip within the vein was confirmed with ultrasound image documentation. Needle exchanged over a 018 guidewire for a peel-away sheath, through which a 5-French double-lumen power injectable PICC trimmed to 38cm was advanced, positioned with its tip near the cavoatrial junction. Spot chest radiograph confirms appropriate catheter position. Catheter was flushed per protocol and secured externally. The patient tolerated procedure well. COMPLICATIONS: COMPLICATIONS none IMPRESSION: 1. Technically successful five [place] double lumen power injectable PICC placement Electronically Signed   By: Lucrezia Europe M.D.   On: 10/28/2015 13:33   Dg Chest Port 1 View  10/25/2015  CLINICAL DATA:  Sickle cell pain.  Shortness of breath. EXAM: PORTABLE CHEST 1 VIEW COMPARISON:  Multiple previous chest x-rays. The most recent is  10/17/2015. FINDINGS: The heart is mildly enlarged but stable. There is moderate vascular congestion and probable mild interstitial edema. No infiltrates or effusions. The bony thorax is intact. 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 IMPRESSION: Cardiac enlargement with moderate vascular congestion and mild interstitial edema. Electronically Signed   By: Marijo Sanes M.D.   On: 10/25/2015 18:45   Dg Chest Portable 1 View  10/17/2015  CLINICAL DATA:  Sickle cell crisis. EXAM: PORTABLE CHEST 1 VIEW COMPARISON:  None. FINDINGS: Mediastinum and hilar structures normal. Cardiomegaly. No evidence of overt congestive heart failure. No focal infiltrate. No pleural effusion or pneumothorax. 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 IMPRESSION: 1. Cardiomegaly. No evidence of overt congestive heart failure. No focal pulmonary infiltrate. 2. Sclerotic changes in the right humeral head suggesting avascular necrosis Electronically Signed   By: [place]   On: 10/17/2015 08:45      OTHER PROCEDURES: Transfusion 2 units RBC's.  No results found for this or any previous visit (from the past 240 hour(s)).  BRIEF ADMITTING H & P: Angelyne L Ledee is a 35 y.o. female with h/o HGB SS disease, patient presents to ED with pain crisis. Pain located in low back, BLE, BUE. 9/10 intensity, typical for pain crisis. Taking MS contin and  oxycodone without relief of pain. No CP, no SOB, no cough, fever, chills, N/V.   Hospital Course:  Present on Admission:  . Sickle cell disease with crisis [facility]): Pt's pain was managed with IV Dilaudid via PCA, Toradol and IVF. She had acute hemolysis which worsened the pain of her crisis. As a result of the hemolysis she required transfusion of 2 units RBC's . As the pain improved, she was transitioned to oral analgesics which included MS Contin and Oxycodone. Pt was ambulatory and tolerating pain control but still reluctant to be discharged. Pt  subsequently disclosed that her reluctance stemmed from the fact that she was unable to afford MS Contin and was afraid that the pain would again escalate without the MS Contin. Case Management was consulted and facilitated a reduced cost to the patient for her medications which per the patient is now affordable. . Acute Hemolytic Anemia:Pt experienced acute hemolysis with a markedly elevated LDH. She was transfused 2 units RBC and at the time of discharge, her Hb was at her baseline of 6.7.  (Normals 6.5-7).  . Tachycardia: Pt developed tachycardia which resolved after transfusion. Marland Kitchen UTI: Urinalysis had elements consistent with a UTI. She was treated with a 3 day course of ciprofloxacin. . Depression: Pt tested positive for mild depression on the depression screen. She is currently on Prozac and may benefit from increasing her medication. However she did not want medications adjusted while going through a crisis. I will defer to her Primary provider to address. At the time of discharge and throughout her hospital she had neither suicidal or homicidal ideations.  . Sickle Cell Disease: Pt is currently on Hydrea which was held initially during her hospitalization due to low Hb. She was discharged home on Hydrea 1000 mg daily and should have labs checked in 2 weeks to evaluate indices. Of note she has had to decrease her dose in the past due to low blood counts but I would recommend that her dose be advanced to the MTD.   Disposition and Follow-up:  Pt discharged in good condition and should follow up with her PMD in 2 weeks or as scheduled.       Discharge Instructions    Activity as tolerated - No restrictions    Complete by:  As directed      Diet general    Complete by:  As directed            DISCHARGE EXAM: General: Alert, awake, oriented x3, in mild distress. Vital Signs: BP 127/68, HR 91, T 98.2 F (36.8 C), temperature source Oral, RR 18, height 5\' 3"  (1.6 m), weight 171 lb (77.565  kg), SpO2 96 % on RA. HEENT: [place]/AT PEERL, EOMI, anicteric Neck: Trachea midline, no masses, no thyromegal,y no JVD, no carotid bruit OROPHARYNX: Moist, No exudate/ erythema/lesions.  Heart: Regular rate and rhythm, without murmurs, rubs, gallops or S3. PMI non-displaced. Exam reveals no decreased pulses. Pulmonary/Chest: Normal effort. Breath sounds normal. No. Apnea. Clear to auscultation,no stridor,  no wheezing and no rhonchi noted. No respiratory distress and no tenderness noted. Abdomen: Soft, nontender, nondistended, normal bowel sounds, no masses no hepatosplenomegaly noted. No fluid wave and no ascites. There is no guarding or rebound. Neuro: Alert and oriented to person, place and time. Normal motor skills, Displays no atrophy or tremors and exhibits normal muscle tone.  No focal neurological deficits noted cranial nerves II through XII grossly intact. No sensory deficit noted. DTRs 2+ bilaterally upper and lower extremities. Strength  at baseline in bilateral upper and lower extremities. Gait normal. Musculoskeletal: No warmth swelling or erythema around joints, no spinal tenderness noted. Psychiatric: Patient alert and oriented x3, good insight and cognition, good recent to remote recall. Mood depressed and affect flat, but memory and judgement normal Skin: Skin is warm and dry. No bruising, no ecchymosis and no rash noted. Pt is not diaphoretic. No erythema. No pallor     Recent Labs  11/02/15 0408  NA 143  K 3.8  CL 106  CO2 29  GLUCOSE 120*  BUN 6  CREATININE 0.50  CALCIUM 8.3*   No results for input(s): AST, ALT, ALKPHOS, BILITOT, PROT, ALBUMIN in the last 72 hours. No results for input(s): LIPASE, AMYLASE in the last 72 hours.  Recent Labs  11/02/15 0408 11/04/15 0510  WBC 8.3 9.4  NEUTROABS 3.9 5.1  HGB 7.6* 6.7*  HCT 22.0* 19.6*  MCV 100.5* 101.0*  PLT 92* 118*     Total time spent including face to face and decision making was greater than 30  minutes  Signed: [name],[name] 11/04/2015, 3:17 PM

## 2015-11-04 NOTE — Progress Notes (Signed)
Nutrition Brief Note  Patient identified for length of stay.  Wt Readings from Last 15 Encounters:  11/03/15 171 lb (77.565 kg)  10/21/15 157 lb (71.215 kg)  10/17/15 157 lb (71.215 kg)  10/14/15 164 lb (74.39 kg)  10/06/15 154 lb (69.854 kg)  09/27/15 158 lb (71.668 kg)  09/05/15 160 lb (72.576 kg)  08/25/15 160 lb (72.576 kg)  08/16/15 159 lb (72.122 kg)  08/16/15 159 lb (72.122 kg)  08/05/15 160 lb (72.576 kg)  06/27/15 161 lb 14.4 oz (73.437 kg)  06/24/15 157 lb (71.215 kg)  05/23/15 156 lb (70.761 kg)  05/22/15 156 lb (70.761 kg)    Body mass index is 30.3 kg/(m^2). Patient meets criteria for obese based on current BMI.   Pt admitted for sickle cell crisis, has experienced no wt loss recently. Endorses eating well, no n/v/d/c complaints.  Current diet order is regular, patient is consuming approximately 95-100% of meals at this time. Labs and medications reviewed.   No nutrition interventions warranted at this time. If nutrition issues arise, please consult RD.   Dawn Anis. Ayari Liwanag, MS, RD LDN After Hours/Weekend Pager [phone]

## 2015-11-05 ENCOUNTER — Encounter (HOSPITAL_COMMUNITY): Payer: Self-pay

## 2015-11-05 ENCOUNTER — Emergency Department (HOSPITAL_COMMUNITY)
Admission: EM | Admit: 2015-11-05 | Discharge: 2015-11-06 | Disposition: A | Discharge status: Home / Self Care | Payer: BLUE CROSS/BLUE SHIELD | Attending: Emergency Medicine | Admitting: Emergency Medicine

## 2015-11-05 DIAGNOSIS — Z8679 Personal history of other diseases of the circulatory system: Secondary | ICD-10-CM | POA: Diagnosis not present

## 2015-11-05 DIAGNOSIS — E538 Deficiency of other specified B group vitamins: Secondary | ICD-10-CM | POA: Diagnosis not present

## 2015-11-05 DIAGNOSIS — Z91040 Latex allergy status: Secondary | ICD-10-CM | POA: Insufficient documentation

## 2015-11-05 DIAGNOSIS — Z79899 Other long term (current) drug therapy: Secondary | ICD-10-CM | POA: Diagnosis not present

## 2015-11-05 DIAGNOSIS — Z8701 Personal history of pneumonia (recurrent): Secondary | ICD-10-CM | POA: Insufficient documentation

## 2015-11-05 DIAGNOSIS — Z8659 Personal history of other mental and behavioral disorders: Secondary | ICD-10-CM | POA: Insufficient documentation

## 2015-11-05 DIAGNOSIS — D5700 Hb-SS disease with crisis, unspecified: Secondary | ICD-10-CM | POA: Diagnosis not present

## 2015-11-05 DIAGNOSIS — R110 Nausea: Secondary | ICD-10-CM | POA: Insufficient documentation

## 2015-11-05 DIAGNOSIS — M7989 Other specified soft tissue disorders: Secondary | ICD-10-CM | POA: Diagnosis not present

## 2015-11-05 LAB — CBC WITH DIFFERENTIAL/PLATELET
Band Neutrophils: 0 %
Basophils Absolute: 0 10*3/uL (ref 0.0–0.1)
Basophils Relative: 0 %
Blasts: 0 %
Eosinophils Absolute: 0 10*3/uL (ref 0.0–0.7)
Eosinophils Relative: 0 %
HCT: 23.4 % — ABNORMAL LOW (ref 36.0–46.0)
Hemoglobin: 8.2 g/dL — ABNORMAL LOW (ref 12.0–15.0)
Lymphocytes Relative: 26 %
Lymphs Abs: 1.8 10*3/uL (ref 0.7–4.0)
MCH: 34.9 pg — ABNORMAL HIGH (ref 26.0–34.0)
MCHC: 35 g/dL (ref 30.0–36.0)
MCV: 99.6 fL (ref 78.0–100.0)
Metamyelocytes Relative: 0 %
Monocytes Absolute: 0.8 10*3/uL (ref 0.1–1.0)
Monocytes Relative: 11 %
Myelocytes: 0 %
Neutro Abs: 4.3 10*3/uL (ref 1.7–7.7)
Neutrophils Relative %: 63 %
Other: 0 %
Platelets: 100 10*3/uL — ABNORMAL LOW (ref 150–400)
Promyelocytes Absolute: 0 %
RBC: 2.35 MIL/uL — ABNORMAL LOW (ref 3.87–5.11)
RDW: 24.7 % — ABNORMAL HIGH (ref 11.5–15.5)
Smear Review: ADEQUATE
WBC: 6.9 10*3/uL (ref 4.0–10.5)
nRBC: 8 /100 WBC — ABNORMAL HIGH

## 2015-11-05 LAB — RETICULOCYTES
RBC.: 2.35 MIL/uL — ABNORMAL LOW (ref 3.87–5.11)
Retic Count, Absolute: 575.8 10*3/uL — ABNORMAL HIGH (ref 19.0–186.0)
Retic Ct Pct: 24.5 % — ABNORMAL HIGH (ref 0.4–3.1)

## 2015-11-05 LAB — COMPREHENSIVE METABOLIC PANEL
ALT: 38 U/L (ref 14–54)
AST: 125 U/L — ABNORMAL HIGH (ref 15–41)
Albumin: 2.7 g/dL — ABNORMAL LOW (ref 3.5–5.0)
Alkaline Phosphatase: 134 U/L — ABNORMAL HIGH (ref 38–126)
Anion gap: 8 (ref 5–15)
BUN: 6 mg/dL (ref 6–20)
CO2: 23 mmol/L (ref 22–32)
Calcium: 8.9 mg/dL (ref 8.9–10.3)
Chloride: 106 mmol/L (ref 101–111)
Creatinine, Ser: 0.4 mg/dL — ABNORMAL LOW (ref 0.44–1.00)
GFR calc Af Amer: >60 mL/min (ref >60)
GFR calc non Af Amer: >60 mL/min (ref >60)
Glucose, Bld: 99 mg/dL (ref 65–99)
Potassium: 4.1 mmol/L (ref 3.5–5.1)
Sodium: 137 mmol/L (ref 135–145)
Total Bilirubin: 8 mg/dL — ABNORMAL HIGH (ref 0.3–1.2)
Total Protein: 7.4 g/dL (ref 6.5–8.1)

## 2015-11-05 MED ORDER — HYDROMORPHONE HCL 2 MG/ML IJ SOLN
2.0000 mg | Freq: Once | INTRAMUSCULAR | Status: AC
Start: 2015-11-06 — End: 2015-11-05
  Administered 2015-11-05: 2 mg via INTRAVENOUS
  Filled 2015-11-05: qty 1

## 2015-11-05 MED ORDER — SODIUM CHLORIDE 0.9 % IV SOLN
INTRAVENOUS | Status: DC
  Administered 2015-11-05: via INTRAVENOUS

## 2015-11-05 MED ORDER — PROMETHAZINE HCL 25 MG/ML IJ SOLN
12.5000 mg | Freq: Once | INTRAMUSCULAR | Status: AC
  Administered 2015-11-05: 12.5 mg via INTRAVENOUS
  Filled 2015-11-05: qty 1

## 2015-11-05 MED ORDER — KETOROLAC TROMETHAMINE 30 MG/ML IJ SOLN
15.0000 mg | Freq: Once | INTRAMUSCULAR | Status: AC
  Administered 2015-11-05: 15 mg via INTRAVENOUS
  Filled 2015-11-05: qty 1

## 2015-11-05 NOTE — ED Notes (Signed)
I was in the hospital for sickle cell pain and they discharged me and I was still having a high level of pain. My chest, legs, and back are hurting per pt. I have been taking my home medications without relief.

## 2015-11-05 NOTE — ED Notes (Signed)
Provided pt with hot pack to help with pain until Dr. Gorden Harms in treatment orders

## 2015-11-05 NOTE — ED Provider Notes (Signed)
CSN: [id number]     Arrival date & time 11/05/15  2102 History  By signing my name below, I, Dawn [name], attest that this documentation has been prepared under the direction and in the presence of Dawn Manifold, MD . Electronically Signed: Dora [name], Scribe. 11/05/2015. 10:47 PM.    Chief Complaint  Patient presents with  . Sickle Cell Pain Crisis      The history is provided by the patient. No language interpreter was used.     HPI Comments: Dawn [name] is a 35 y.o. female with h/o sickle cell disease who presents to the Emergency Department complaining of sudden onset, worsening, sickle-cell associated pains in her left leg, back, and left arm beginning earlier today. She states that these pains are typical because of her sickle-cell condition.  Pt reports associated moderate mid-chest pain as well. She also endorses associated nausea. She endorses mild left leg swelling but states that this is also normal. She has been taking OxyContin, dilaudid, and other home medications with no relief. She denies rash, SOB, cough, fever, chills, or any other associated symptoms at this time. She has a PSHx of cholecystectomy, left knee ACL reconstruction, cesarean section x2, portacath placement and portacath removal.    Past Medical History  Diagnosis Date  . Sickle cell disease ([place])   . 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 Blood transfusion   . 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 HCAP (healthcare-associated pneumonia) 05/19/2013  . Sickle cell anemia ([facility])   . 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 Vitamin B12 deficiency 07/07/2015   Past Surgical History  Procedure Laterality Date  . Cholecystectomy    .  left knee acl reconstruction    . Cesarean section      x 2  . Portacath placement    . Portacath placement Left 02/23/2013    Procedure: INSERTION PORT-A-CATH;  Surgeon: Donato Heinz, MD;  Location: [facility];  Service: General;  Laterality: Left;  . Port-a-cath removal  Left 07/29/2013    Procedure: REMOVAL PORT-A-CATH;  Surgeon: Scherry Ran, MD;  Location: [facility];  Service: General;  Laterality: Left;  . Tee without cardioversion N/A 08/07/2013    Procedure: TRANSESOPHAGEAL ECHOCARDIOGRAM (TEE);  Surgeon: Arnoldo Lenis, MD;  Location: [facility];  Service: Cardiology;  Laterality: N/A;   Family History  Problem Relation Age of Onset  . Sickle cell trait Mother   . Sickle cell trait Father   . Hypertension Father   . Diabetes Father   . Sickle cell trait Sister   . Cancer Paternal Aunt     Breast   Social History  Substance Use Topics  . Smoking status: Never Smoker   . Smokeless tobacco: Former Systems developer  . Alcohol Use: No   OB History    Gravida Para Term Preterm AB TAB SAB Ectopic Multiple Living   1 1  1      1      Review of Systems  Constitutional: Negative for fever and chills.  Respiratory: Negative for cough and shortness of breath.   Cardiovascular: Positive for chest pain (moderate) and leg swelling (left leg).  Gastrointestinal: Positive for nausea.  Musculoskeletal: Positive for back pain and arthralgias (left leg, left arm).  Skin: Negative for rash.  All other systems reviewed and are negative.     Allergies  Desferal; Latex; Lisinopril; and Tape  Home Medications   Prior to Admission medications   Medication Sig Start Date End Date  Taking? Authorizing Provider  folic acid (FOLVITE) 1 MG tablet Take 1 tablet (1 mg total) by mouth daily. 08/16/15  Yes [name], FNP  hydroxyurea (HYDREA) 500 MG capsule Take 2 capsules (1,000 mg total) by mouth daily. May take with food to minimize GI side effects. 09/05/15  Yes [name], FNP  ibuprofen (ADVIL,MOTRIN) 200 MG tablet Take 400 mg by mouth every 6 (six) hours as needed for moderate pain.    Yes Historical Provider, MD  loratadine (CLARITIN) 10 MG tablet Take 10 mg by mouth daily.   Yes Historical Provider, MD  morphine (MS CONTIN) 15 MG 12 hr tablet  Take 1 tablet (15 mg total) by mouth every 12 (twelve) hours. 10/06/15  Yes [name], FNP  Multiple Vitamin (MULTIVITAMIN WITH MINERALS) TABS tablet Take 1 tablet by mouth daily.   Yes Historical Provider, MD  Oxycodone HCl 10 MG TABS Take 1 tablet (10 mg total) by mouth every 6 (six) hours as needed. Patient taking differently: Take 10 mg by mouth every 6 (six) hours as needed (breakthrough pain).  10/06/15  Yes [name], FNP  promethazine (PHENERGAN) 12.5 MG tablet Take 1 tablet (12.5 mg total) by mouth every 6 (six) hours as needed for nausea. 08/31/15  Yes [name], MD  Vitamin D, Ergocalciferol, (DRISDOL) 50000 UNITS CAPS capsule Take 1 capsule (50,000 Units total) by mouth every 7 (seven) days. Takes on Sundays. 08/16/15  Yes [name], FNP   BP 142/81 mmHg  Pulse 94  Temp(Src) 98.6 F (37 C)  Resp 16  Ht 5\' 3"  (1.6 m)  Wt 161 lb (73.029 kg)  BMI 28.53 kg/m2  SpO2 97% Physical Exam  Constitutional: She appears well-developed and well-nourished. No distress.  HENT:  Head: Normocephalic and atraumatic.  Right Ear: External ear normal.  Left Ear: External ear normal.  Eyes: Conjunctivae are normal. Right eye exhibits no discharge. Left eye exhibits no discharge. Scleral icterus is present.  Neck: Neck supple. No tracheal deviation present.  Cardiovascular: Normal rate, regular rhythm, normal heart sounds and intact distal pulses.   Pulmonary/Chest: Effort normal and breath sounds normal. No stridor. No respiratory distress. She has no wheezes. She has no rales.  Abdominal: Soft. Bowel sounds are normal. She exhibits no distension. There is no tenderness. There is no rebound and no guarding.  Musculoskeletal: She exhibits no edema or tenderness.  Neurological: She is alert. She has normal strength. No cranial nerve deficit (no facial droop, extraocular movements intact, no slurred speech) or sensory deficit. She exhibits normal muscle tone. She displays no  seizure activity. Coordination normal.  Skin: Skin is warm and dry. No rash noted.  Psychiatric: She has a normal mood and affect.  Nursing note and vitals reviewed.   ED Course  Procedures (including critical care time)  DIAGNOSTIC STUDIES: Oxygen Saturation is 97% on RA, normal by my interpretation.    COORDINATION OF CARE:  10:47 PM Will order CMP, CBC with differential, and Reticulocytes. Discussed treatment plan with pt at bedside and pt agreed to plan.   Labs Review Labs Reviewed  COMPREHENSIVE METABOLIC PANEL - Abnormal; Notable for the following:    Creatinine, Ser 0.40 (*)    Albumin 2.7 (*)    AST 125 (*)    Alkaline Phosphatase 134 (*)    Total Bilirubin 8.0 (*)    All other components within normal limits  CBC WITH DIFFERENTIAL/PLATELET - Abnormal; Notable for the following:    RBC 2.35 (*)  Hemoglobin 8.2 (*)    HCT 23.4 (*)    MCH 34.9 (*)    RDW 24.7 (*)    Platelets 100 (*)    nRBC 8 (*)    All other components within normal limits  RETICULOCYTES - Abnormal; Notable for the following:    Retic Ct Pct 24.5 (*)    RBC. 2.35 (*)    Retic Count, Manual 575.8 (*)    All other components within normal limits    Imaging Review No results found. I have personally reviewed and evaluated these lab results as part of my medical decision-making.   EKG Interpretation None      MDM   Final diagnoses:  Sickle cell pain crisis ([place])    35 year old female with body pain consistent with symptoms of prior sickle cell pain crises. She is afebrile. She has no respiratory complaints. She is hemodynamically stable. This primarily seems to be a pain control issue. I have a low suspicion for emergent complication of sickle cell anemia otherwise. Her goal is to get her pain to 4/10 as she feels she can reasonably function at this level. Will treat her symptoms to try to achieve this. Will check some basic labs and address significant abnormalities as indicated.  Baseline hemoglobin appears to be 7-8 range.   I personally preformed the services scribed in my presence. The recorded information has been reviewed is accurate. Dawn Manifold, MD.      Dawn Manifold, MD 11/17/15 [phone]

## 2015-11-06 MED ORDER — HYDROMORPHONE HCL 2 MG/ML IJ SOLN
2.0000 mg | INTRAMUSCULAR | Status: AC | PRN
  Administered 2015-11-06 (×2): 2 mg via INTRAVENOUS
  Filled 2015-11-06 (×2): qty 1

## 2015-11-06 MED ORDER — PROMETHAZINE HCL 25 MG/ML IJ SOLN: 12.5000 mg | Freq: Once | INTRAMUSCULAR | Status: DC | PRN

## 2015-11-06 MED ORDER — DIPHENHYDRAMINE HCL 50 MG/ML IJ SOLN: 25.0000 mg | Freq: Once | INTRAMUSCULAR | Status: DC | PRN

## 2015-11-06 NOTE — ED Provider Notes (Signed)
Pain is significantly improved, patient eating and drinking without difficulty. Wants to go home. Advised of return precautions.  Sherwood Gambler, MD 11/06/15 [phone]

## 2015-11-06 NOTE — Discharge Instructions (Signed)
Sickle Cell Anemia, Adult Sickle cell anemia is a condition in which red blood cells have an abnormal "sickle" shape. This abnormal shape shortens the cells' life span, which results in a lower than normal concentration of red blood cells in the blood. The sickle shape also causes the cells to clump together and block free blood flow through the blood vessels. As a result, the tissues and organs of the body do not receive enough oxygen. Sickle cell anemia causes organ damage and pain and increases the risk of infection. CAUSES  Sickle cell anemia is a genetic disorder. Those who receive two copies of the gene have the condition, and those who receive one copy have the trait. RISK FACTORS The sickle cell gene is most common in people whose families originated in [place]. Other areas of the globe where sickle cell trait occurs include the [place], [place] and [place], the [place], and the [place].  SIGNS AND SYMPTOMS  Pain, especially in the extremities, back, chest, or abdomen (common). The pain may start suddenly or may develop following an illness, especially if there is dehydration. Pain can also occur due to overexertion or exposure to extreme temperature changes.  Frequent severe bacterial infections, especially certain types of pneumonia and meningitis.  Pain and swelling in the hands and feet.  Decreased activity.   Loss of appetite.   Change in behavior.  Headaches.  Seizures.  Shortness of breath or difficulty breathing.  Vision changes.  Skin ulcers. Those with the trait may not have symptoms or they may have mild symptoms.  DIAGNOSIS  Sickle cell anemia is diagnosed with blood tests that demonstrate the genetic trait. It is often diagnosed during the newborn period, due to mandatory testing nationwide. A variety of blood tests, X-rays, CT scans, MRI scans, ultrasounds, and lung function tests may also be done to monitor the condition. TREATMENT  Sickle  cell anemia may be treated with:  Medicines. You may be given pain medicines, antibiotic medicines (to treat and prevent infections) or medicines to increase the production of certain types of hemoglobin.  Fluids.  Oxygen.  Blood transfusions. HOME CARE INSTRUCTIONS   Drink enough fluid to keep your urine clear or pale yellow. Increase your fluid intake in hot weather and during exercise.  Do not smoke. Smoking lowers oxygen levels in the blood.   Only take over-the-counter or prescription medicines for pain, fever, or discomfort as directed by your health care provider.  Take antibiotics as directed by your health care provider. Make sure you finish them it even if you start to feel better.   Take supplements as directed by your health care provider.   Consider wearing a medical alert bracelet. This tells anyone caring for you in an emergency of your condition.   When traveling, keep your medical information, health care provider's names, and the medicines you take with you at all times.   If you develop a fever, do not take medicines to reduce the fever right away. This could cover up a problem that is developing. Notify your health care provider.  Keep all follow-up appointments with your health care provider. Sickle cell anemia requires regular medical care. SEEK MEDICAL CARE IF: You have a fever. SEEK IMMEDIATE MEDICAL CARE IF:   You feel dizzy or faint.   You have new abdominal pain, especially on the left side near the stomach area.   You develop a persistent, often uncomfortable and painful penile erection (priapism). If this is not treated immediately it   will lead to impotence.   You have numbness your arms or legs or you have a hard time moving them.   You have a hard time with speech.   You have a fever or persistent symptoms for more than 2-3 days.   You have a fever and your symptoms suddenly get worse.   You have signs or symptoms of infection.  These include:   Chills.   Abnormal tiredness (lethargy).   Irritability.   Poor eating.   Vomiting.   You develop pain that is not helped with medicine.   You develop shortness of breath.  You have pain in your chest.   You are coughing up pus-like or bloody sputum.   You develop a stiff neck.  Your feet or hands swell or have pain.  Your abdomen appears bloated.  You develop joint pain. MAKE SURE YOU:  Understand these instructions.   This information is not intended to replace advice given to you by your health care provider. Make sure you discuss any questions you have with your health care provider.   Document Released: 12/19/2005 Document Revised: 10/01/2014 Document Reviewed: 04/22/2013 Elsevier Interactive Patient Education 2016 [employer]  

## 2015-11-06 NOTE — ED Notes (Signed)
Pt states understanding of care given and follow up instructions.  Ambulated from ED to waiting area to wait for ride

## 2015-11-06 NOTE — ED Notes (Signed)
Pt given saltine crackers and drink per her request,

## 2015-11-07 ENCOUNTER — Encounter (HOSPITAL_COMMUNITY): Payer: Self-pay | Admitting: *Deleted

## 2015-11-07 ENCOUNTER — Emergency Department (HOSPITAL_COMMUNITY)
Admission: EM | Admit: 2015-11-07 | Discharge: 2015-11-07 | Disposition: A | Discharge status: Home / Self Care | Payer: BLUE CROSS/BLUE SHIELD | Attending: Emergency Medicine | Admitting: Emergency Medicine

## 2015-11-07 DIAGNOSIS — Z8659 Personal history of other mental and behavioral disorders: Secondary | ICD-10-CM | POA: Insufficient documentation

## 2015-11-07 DIAGNOSIS — E538 Deficiency of other specified B group vitamins: Secondary | ICD-10-CM | POA: Diagnosis not present

## 2015-11-07 DIAGNOSIS — Z8679 Personal history of other diseases of the circulatory system: Secondary | ICD-10-CM | POA: Insufficient documentation

## 2015-11-07 DIAGNOSIS — D5700 Hb-SS disease with crisis, unspecified: Secondary | ICD-10-CM

## 2015-11-07 DIAGNOSIS — Z8701 Personal history of pneumonia (recurrent): Secondary | ICD-10-CM | POA: Insufficient documentation

## 2015-11-07 DIAGNOSIS — Z79899 Other long term (current) drug therapy: Secondary | ICD-10-CM | POA: Diagnosis not present

## 2015-11-07 DIAGNOSIS — Z91040 Latex allergy status: Secondary | ICD-10-CM | POA: Insufficient documentation

## 2015-11-07 LAB — CBC WITH DIFFERENTIAL/PLATELET
BASOS ABS: 0 10*3/uL (ref 0.0–0.1)
Basophils Relative: 1 %
Eosinophils Absolute: 0 10*3/uL (ref 0.0–0.7)
Eosinophils Relative: 0 %
HEMATOCRIT: 24.7 % — AB (ref 36.0–46.0)
HEMOGLOBIN: 8.7 g/dL — AB (ref 12.0–15.0)
LYMPHS ABS: 1.6 10*3/uL (ref 0.7–4.0)
LYMPHS PCT: 27 %
MCH: 34.8 pg — ABNORMAL HIGH (ref 26.0–34.0)
MCHC: 35.2 g/dL (ref 30.0–36.0)
MCV: 98.8 fL (ref 78.0–100.0)
Monocytes Absolute: 0.9 10*3/uL (ref 0.1–1.0)
Monocytes Relative: 14 %
NEUTROS ABS: 3.6 10*3/uL (ref 1.7–7.7)
NEUTROS PCT: 58 %
Platelets: 155 10*3/uL (ref 150–400)
RBC: 2.5 MIL/uL — AB (ref 3.87–5.11)
RDW: 24.5 % — ABNORMAL HIGH (ref 11.5–15.5)
WBC: 6 10*3/uL (ref 4.0–10.5)

## 2015-11-07 LAB — COMPREHENSIVE METABOLIC PANEL
ALT: 43 U/L (ref 14–54)
AST: 125 U/L — AB (ref 15–41)
Albumin: 2.8 g/dL — ABNORMAL LOW (ref 3.5–5.0)
Alkaline Phosphatase: 125 U/L (ref 38–126)
Anion gap: 9 (ref 5–15)
BILIRUBIN TOTAL: 7.5 mg/dL — AB (ref 0.3–1.2)
BUN: 5 mg/dL — AB (ref 6–20)
CO2: 23 mmol/L (ref 22–32)
CREATININE: 0.37 mg/dL — AB (ref 0.44–1.00)
Calcium: 8.4 mg/dL — ABNORMAL LOW (ref 8.9–10.3)
Chloride: 107 mmol/L (ref 101–111)
GFR calc Af Amer: >60 mL/min (ref >60)
Glucose, Bld: 96 mg/dL (ref 65–99)
POTASSIUM: 4 mmol/L (ref 3.5–5.1)
Sodium: 139 mmol/L (ref 135–145)
TOTAL PROTEIN: 8 g/dL (ref 6.5–8.1)

## 2015-11-07 LAB — RETICULOCYTES
RBC.: 2.33 MIL/uL — AB (ref 3.87–5.11)
Retic Count, Absolute: 456.7 10*3/uL — ABNORMAL HIGH (ref 19.0–186.0)
Retic Ct Pct: 19.6 % — ABNORMAL HIGH (ref 0.4–3.1)

## 2015-11-07 MED ORDER — HYDROMORPHONE HCL 2 MG/ML IJ SOLN: 0.0250 mg/kg | INTRAMUSCULAR | Status: AC

## 2015-11-07 MED ORDER — ONDANSETRON HCL 4 MG/2ML IJ SOLN
4.0000 mg | INTRAMUSCULAR | Status: DC | PRN
  Administered 2015-11-07: 4 mg via INTRAVENOUS
  Filled 2015-11-07: qty 2

## 2015-11-07 MED ORDER — HYDROMORPHONE HCL 2 MG/ML IJ SOLN
0.0313 mg/kg | INTRAMUSCULAR | Status: AC
  Administered 2015-11-07: 2.3 mg via INTRAVENOUS
  Filled 2015-11-07: qty 2

## 2015-11-07 MED ORDER — KETOROLAC TROMETHAMINE 30 MG/ML IJ SOLN
30.0000 mg | INTRAMUSCULAR | Status: AC
  Administered 2015-11-07: 30 mg via INTRAVENOUS
  Filled 2015-11-07: qty 1

## 2015-11-07 MED ORDER — PROMETHAZINE HCL 25 MG/ML IJ SOLN
12.5000 mg | Freq: Once | INTRAMUSCULAR | Status: AC
  Administered 2015-11-07: 12.5 mg via INTRAVENOUS
  Filled 2015-11-07: qty 1

## 2015-11-07 MED ORDER — HYDROMORPHONE HCL 2 MG/ML IJ SOLN: 0.0375 mg/kg | INTRAMUSCULAR | Status: AC

## 2015-11-07 MED ORDER — SODIUM CHLORIDE 0.45 % IV SOLN
INTRAVENOUS | Status: DC
  Administered 2015-11-07: 10:00:00 via INTRAVENOUS

## 2015-11-07 MED ORDER — HYDROMORPHONE HCL 2 MG/ML IJ SOLN: 0.0313 mg/kg | INTRAMUSCULAR | Status: AC

## 2015-11-07 MED ORDER — HYDROMORPHONE HCL 2 MG/ML IJ SOLN
0.0375 mg/kg | INTRAMUSCULAR | Status: AC
  Administered 2015-11-07: 2.7 mg via INTRAVENOUS
  Filled 2015-11-07: qty 2

## 2015-11-07 MED ORDER — HYDROMORPHONE HCL 2 MG/ML IJ SOLN
0.0250 mg/kg | INTRAMUSCULAR | Status: AC
  Administered 2015-11-07: 1.8 mg via INTRAVENOUS
  Filled 2015-11-07: qty 1

## 2015-11-07 NOTE — ED Provider Notes (Signed)
CSN: [id number]     Arrival date & time 11/07/15  0845 History  By signing my name below, I, Emmanuella Mensah, attest that this documentation has been prepared under the direction and in the presence of Daleen Bo, MD. Electronically Signed: Judithann Sauger, ED Scribe. 11/07/2015. 9:25 AM.      Chief Complaint  Patient presents with  . Sickle Cell Pain Crisis   The history is provided by the patient. No language interpreter was used.   HPI Comments: Dawn [name] is a 35 y.o. female with a hx of SS Sickle Cell disease who presents to the Emergency Department complaining of gradually worsening 8/10 bilateral arm pain, bilateral knee pain that radiates down her legs, and lower back pain consistent with an exacerbation of her sickle cell pain crisis onset 2 days ago. She reports associated mild decreased in appetite. She explains that she has been compliant with her Oxycodone and MS contin but the pain still persists. She states that she was admitted to the hospital on 10/26/15. She also states that she was seen here 2 days ago where she received IV pain medications and advised to return is pain persists. No sick contacts noted. Pt denies any abdominal surgeries. No menstrual cycle, pt is currently using an IUD. She reports that she works as a Engineer, manufacturing at [employer]. She denies any fever, cough, sneezing, nasal congestion, vomiting, CP, SOB, or dysuria,   PCP: Dr. Doreene Burke   Past Medical History  Diagnosis Date  . Sickle cell disease ([place])   . 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 Blood transfusion   . 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 HCAP (healthcare-associated pneumonia) 05/19/2013  . Sickle cell anemia ([facility])   . 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 Vitamin B12 deficiency 07/07/2015   Past Surgical History  Procedure Laterality Date  . Cholecystectomy    .  left knee acl reconstruction    . Cesarean section      x 2  . Portacath placement    . Portacath  placement Left 02/23/2013    Procedure: INSERTION PORT-A-CATH;  Surgeon: Donato Heinz, MD;  Location: [facility];  Service: General;  Laterality: Left;  . Port-a-cath removal Left 07/29/2013    Procedure: REMOVAL PORT-A-CATH;  Surgeon: Scherry Ran, MD;  Location: [facility];  Service: General;  Laterality: Left;  . Tee without cardioversion N/A 08/07/2013    Procedure: TRANSESOPHAGEAL ECHOCARDIOGRAM (TEE);  Surgeon: Arnoldo Lenis, MD;  Location: [facility];  Service: Cardiology;  Laterality: N/A;   Family History  Problem Relation Age of Onset  . Sickle cell trait Mother   . Sickle cell trait Father   . Hypertension Father   . Diabetes Father   . Sickle cell trait Sister   . Cancer Paternal Aunt     Breast   Social History  Substance Use Topics  . Smoking status: Never Smoker   . Smokeless tobacco: Former Systems developer  . Alcohol Use: No   OB History    Gravida Para Term Preterm AB TAB SAB Ectopic Multiple Living   1 1  1      1      Review of Systems  Constitutional: Negative for fever.  HENT: Negative for congestion and sneezing.   Respiratory: Negative for cough and shortness of breath.   Cardiovascular: Negative for chest pain.  Gastrointestinal: Negative for vomiting.  Genitourinary: Negative for dysuria.  All other systems reviewed and are negative.  Allergies  Desferal; Latex; Lisinopril; and Tape  Home Medications   Prior to Admission medications   Medication Sig Start Date End Date Taking? Authorizing Provider  folic acid (FOLVITE) 1 MG tablet Take 1 tablet (1 mg total) by mouth daily. 08/16/15  Yes [name], FNP  hydroxyurea (HYDREA) 500 MG capsule Take 2 capsules (1,000 mg total) by mouth daily. May take with food to minimize GI side effects. 09/05/15  Yes [name], FNP  ibuprofen (ADVIL,MOTRIN) 200 MG tablet Take 400 mg by mouth every 6 (six) hours as needed for moderate pain.    Yes Historical Provider, MD  loratadine (CLARITIN) 10 MG  tablet Take 10 mg by mouth daily.   Yes Historical Provider, MD  morphine (MS CONTIN) 15 MG 12 hr tablet Take 1 tablet (15 mg total) by mouth every 12 (twelve) hours. 10/06/15  Yes [name], FNP  Multiple Vitamin (MULTIVITAMIN WITH MINERALS) TABS tablet Take 1 tablet by mouth daily.   Yes Historical Provider, MD  Oxycodone HCl 10 MG TABS Take 1 tablet (10 mg total) by mouth every 6 (six) hours as needed. Patient taking differently: Take 10 mg by mouth every 6 (six) hours as needed (breakthrough pain).  10/06/15  Yes [name], FNP  promethazine (PHENERGAN) 12.5 MG tablet Take 1 tablet (12.5 mg total) by mouth every 6 (six) hours as needed for nausea. 08/31/15  Yes [name], MD  Vitamin D, Ergocalciferol, (DRISDOL) 50000 UNITS CAPS capsule Take 1 capsule (50,000 Units total) by mouth every 7 (seven) days. Takes on Sundays. 08/16/15  Yes [name], FNP   BP 124/71 mmHg  Pulse 85  Temp(Src) 98.1 F (36.7 C) (Oral)  Resp 20  Ht 5\' 3"  (1.6 m)  Wt 161 lb (73.029 kg)  BMI 28.53 kg/m2  SpO2 94% Physical Exam  Constitutional: She is oriented to person, place, and time. She appears well-developed and well-nourished.  HENT:  Head: Normocephalic and atraumatic.  Eyes: Conjunctivae and EOM are normal. Pupils are equal, round, and reactive to light.  Neck: Normal range of motion and phonation normal. Neck supple.  Cardiovascular: Normal rate and regular rhythm.   Pulmonary/Chest: Effort normal and breath sounds normal. She exhibits no tenderness.  Abdominal: Soft. She exhibits no distension. There is no tenderness. There is no guarding.  Musculoskeletal: Normal range of motion.  Tender knees bilateral, no joint swelling of any of the large joints  Neurological: She is alert and oriented to person, place, and time. She exhibits normal muscle tone.  Skin: Skin is warm and dry.  Psychiatric: She has a normal mood and affect. Her behavior is normal. Judgment and thought content  normal.  Nursing note and vitals reviewed.   ED Course  Procedures (including critical care time) DIAGNOSTIC STUDIES: Oxygen Saturation is 94% on RA, adequate by my interpretation.    COORDINATION OF CARE: 9:20 AM- Pt advised of plan for treatment and pt agrees. Pt will receive lab work for further evaluation. She will also receive Dilaudid and Toradol for pain management.    Medications  0.45 % sodium chloride infusion ( Intravenous New Bag/Given 11/07/15 0953)  ondansetron (ZOFRAN) injection 4 mg (4 mg Intravenous Given 11/07/15 0953)  ketorolac (TORADOL) 30 MG/ML injection 30 mg (30 mg Intravenous Given 11/07/15 0953)  HYDROmorphone (DILAUDID) injection 1.8 mg (1.8 mg Intravenous Given 11/07/15 0953)    Or  HYDROmorphone (DILAUDID) injection 1.8 mg ( Subcutaneous See Alternative 11/07/15 0953)  HYDROmorphone (DILAUDID) injection 2.3  mg (2.3 mg Intravenous Given 11/07/15 1112)    Or  HYDROmorphone (DILAUDID) injection 2.3 mg ( Subcutaneous See Alternative 11/07/15 1112)  promethazine (PHENERGAN) injection 12.5 mg (12.5 mg Intravenous Given 11/07/15 1125)  HYDROmorphone (DILAUDID) injection 2.7 mg (2.7 mg Intravenous Given 11/07/15 1328)    Or  HYDROmorphone (DILAUDID) injection 2.7 mg ( Subcutaneous See Alternative 11/07/15 1328)     Patient Vitals for the past 24 hrs:  BP Temp Temp src Pulse Resp SpO2 Height Weight  11/07/15 0853 112/63 mmHg 98.1 F (36.7 C) Oral 93 16 94 % 5\' 3"  (1.6 m) 161 lb (73.029 kg)    Medications  0.45 % sodium chloride infusion ( Intravenous New Bag/Given 11/07/15 0953)  ondansetron (ZOFRAN) injection 4 mg (4 mg Intravenous Given 11/07/15 0953)  ketorolac (TORADOL) 30 MG/ML injection 30 mg (30 mg Intravenous Given 11/07/15 0953)  HYDROmorphone (DILAUDID) injection 1.8 mg (1.8 mg Intravenous Given 11/07/15 0953)    Or  HYDROmorphone (DILAUDID) injection 1.8 mg ( Subcutaneous See Alternative 11/07/15 0953)  HYDROmorphone (DILAUDID) injection 2.3 mg (2.3 mg  Intravenous Given 11/07/15 1112)    Or  HYDROmorphone (DILAUDID) injection 2.3 mg ( Subcutaneous See Alternative 11/07/15 1112)  promethazine (PHENERGAN) injection 12.5 mg (12.5 mg Intravenous Given 11/07/15 1125)  HYDROmorphone (DILAUDID) injection 2.7 mg (2.7 mg Intravenous Given 11/07/15 1328)    Or  HYDROmorphone (DILAUDID) injection 2.7 mg ( Subcutaneous See Alternative 11/07/15 1328)    Patient Vitals for the past 24 hrs:  BP Temp Temp src Pulse Resp SpO2 Height Weight  11/07/15 1200 124/71 mmHg - - 85 20 94 % - -  11/07/15 1133 119/91 mmHg - - 91 14 95 % - -  11/07/15 1130 119/91 mmHg - - 89 13 97 % - -  11/07/15 1100 117/64 mmHg - - 81 10 98 % - -  11/07/15 0853 112/63 mmHg 98.1 F (36.7 C) Oral 93 16 94 % 5\' 3"  (1.6 m) 161 lb (73.029 kg)    2:35 PM Reevaluation with update and discussion. After initial assessment and treatment, an updated evaluation reveals clinical examination is unchanged. She feels comfortable now, enough to go home. Findings discussed with the patient, all questions were answered. Yarianna Varble L    CRITICAL CARE Performed by: Daleen Bo L Total critical care time: 35  minutes Critical care time was exclusive of separately billable procedures and treating other patients. Critical care was necessary to treat or prevent imminent or life-threatening deterioration. Critical care was time spent personally by me on the following activities: development of treatment plan with patient and/or surrogate as well as nursing, discussions with consultants, evaluation of patient's response to treatment, examination of patient, obtaining history from patient or surrogate, ordering and performing treatments and interventions, ordering and review of laboratory studies, ordering and review of radiographic studies, pulse oximetry and re-evaluation of patient's condition.    Labs Review Labs Reviewed  RETICULOCYTES - Abnormal; Notable for the following:    Retic Ct Pct 19.6  (*)    RBC. 2.33 (*)    Retic Count, Manual 456.7 (*)    All other components within normal limits  COMPREHENSIVE METABOLIC PANEL - Abnormal; Notable for the following:    BUN 5 (*)    Creatinine, Ser 0.37 (*)    Calcium 8.4 (*)    Albumin 2.8 (*)    AST 125 (*)    Total Bilirubin 7.5 (*)    All other components within normal limits  CBC WITH DIFFERENTIAL/PLATELET - Abnormal; Notable  for the following:    RBC 2.50 (*)    Hemoglobin 8.7 (*)    HCT 24.7 (*)    MCH 34.8 (*)    RDW 24.5 (*)    All other components within normal limits  URINALYSIS, ROUTINE W REFLEX MICROSCOPIC (NOT AT [facility])    Daleen Bo, MD has personally reviewed and evaluated these lab results as part of his medical decision-making.  MDM   Final diagnoses:  Sickle cell crisis ([place])    Sickle cell crisis, with recent exacerbation of frequency of crises, over the last several weeks, requiring multiple interventions, and evaluations. No particular cause discovered on testing today. Pain controlled after 3 treatments with analgesia. Patient is improved and comfortable going home.  Nursing Notes Reviewed/ Care Coordinated Applicable Imaging Reviewed Interpretation of Laboratory Data incorporated into ED treatment  The patient appears reasonably screened and/or stabilized for discharge and I doubt any other medical condition or other [facility] requiring further screening, evaluation, or treatment in the ED at this time prior to discharge.  Plan: Home Medications- usual; Home Treatments- rest; return here if the recommended treatment, does not improve the symptoms; Recommended follow up- PCP prn     I personally performed the services described in this documentation, which was scribed in my presence. The recorded information has been reviewed and is accurate.       Daleen Bo, MD 11/07/15 [phone]

## 2015-11-07 NOTE — ED Notes (Signed)
Pt was recently admitted to [facility] for sickle cell. Pt was discharged and came here on Saturday and was told to come back if her pain persisted. Pt is having pain in her back and legs. Per pt she has been trying to drink fluids.

## 2015-11-07 NOTE — ED Notes (Signed)
Patient with no complaints at this time. Respirations even and unlabored. Skin warm/dry. Discharge instructions reviewed with patient at this time. Patient given opportunity to voice concerns/ask questions. Patient discharged at this time and left Emergency Department with steady gait.   

## 2015-11-07 NOTE — Discharge Instructions (Signed)
Sickle Cell Anemia, Adult Sickle cell anemia is a condition in which red blood cells have an abnormal "sickle" shape. This abnormal shape shortens the cells' life span, which results in a lower than normal concentration of red blood cells in the blood. The sickle shape also causes the cells to clump together and block free blood flow through the blood vessels. As a result, the tissues and organs of the body do not receive enough oxygen. Sickle cell anemia causes organ damage and pain and increases the risk of infection. CAUSES  Sickle cell anemia is a genetic disorder. Those who receive two copies of the gene have the condition, and those who receive one copy have the trait. RISK FACTORS The sickle cell gene is most common in people whose families originated in [place]. Other areas of the globe where sickle cell trait occurs include the [place], [place] and [place], the [place], and the [place].  SIGNS AND SYMPTOMS  Pain, especially in the extremities, back, chest, or abdomen (common). The pain may start suddenly or may develop following an illness, especially if there is dehydration. Pain can also occur due to overexertion or exposure to extreme temperature changes.  Frequent severe bacterial infections, especially certain types of pneumonia and meningitis.  Pain and swelling in the hands and feet.  Decreased activity.   Loss of appetite.   Change in behavior.  Headaches.  Seizures.  Shortness of breath or difficulty breathing.  Vision changes.  Skin ulcers. Those with the trait may not have symptoms or they may have mild symptoms.  DIAGNOSIS  Sickle cell anemia is diagnosed with blood tests that demonstrate the genetic trait. It is often diagnosed during the newborn period, due to mandatory testing nationwide. A variety of blood tests, X-rays, CT scans, MRI scans, ultrasounds, and lung function tests may also be done to monitor the condition. TREATMENT  Sickle  cell anemia may be treated with:  Medicines. You may be given pain medicines, antibiotic medicines (to treat and prevent infections) or medicines to increase the production of certain types of hemoglobin.  Fluids.  Oxygen.  Blood transfusions. HOME CARE INSTRUCTIONS   Drink enough fluid to keep your urine clear or pale yellow. Increase your fluid intake in hot weather and during exercise.  Do not smoke. Smoking lowers oxygen levels in the blood.   Only take over-the-counter or prescription medicines for pain, fever, or discomfort as directed by your health care provider.  Take antibiotics as directed by your health care provider. Make sure you finish them it even if you start to feel better.   Take supplements as directed by your health care provider.   Consider wearing a medical alert bracelet. This tells anyone caring for you in an emergency of your condition.   When traveling, keep your medical information, health care provider's names, and the medicines you take with you at all times.   If you develop a fever, do not take medicines to reduce the fever right away. This could cover up a problem that is developing. Notify your health care provider.  Keep all follow-up appointments with your health care provider. Sickle cell anemia requires regular medical care. SEEK MEDICAL CARE IF: You have a fever. SEEK IMMEDIATE MEDICAL CARE IF:   You feel dizzy or faint.   You have new abdominal pain, especially on the left side near the stomach area.   You develop a persistent, often uncomfortable and painful penile erection (priapism). If this is not treated immediately it   will lead to impotence.   You have numbness your arms or legs or you have a hard time moving them.   You have a hard time with speech.   You have a fever or persistent symptoms for more than 2-3 days.   You have a fever and your symptoms suddenly get worse.   You have signs or symptoms of infection.  These include:   Chills.   Abnormal tiredness (lethargy).   Irritability.   Poor eating.   Vomiting.   You develop pain that is not helped with medicine.   You develop shortness of breath.  You have pain in your chest.   You are coughing up pus-like or bloody sputum.   You develop a stiff neck.  Your feet or hands swell or have pain.  Your abdomen appears bloated.  You develop joint pain. MAKE SURE YOU:  Understand these instructions.   This information is not intended to replace advice given to you by your health care provider. Make sure you discuss any questions you have with your health care provider.   Document Released: 12/19/2005 Document Revised: 10/01/2014 Document Reviewed: 04/22/2013 Elsevier Interactive Patient Education 2016 [employer]  

## 2015-11-15 ENCOUNTER — Ambulatory Visit: Payer: Self-pay | Admitting: Family Medicine

## 2015-11-22 ENCOUNTER — Encounter (HOSPITAL_COMMUNITY): Payer: Self-pay | Admitting: Emergency Medicine

## 2015-11-22 DIAGNOSIS — Z79899 Other long term (current) drug therapy: Secondary | ICD-10-CM | POA: Insufficient documentation

## 2015-11-22 DIAGNOSIS — D5700 Hb-SS disease with crisis, unspecified: Secondary | ICD-10-CM | POA: Diagnosis not present

## 2015-11-22 DIAGNOSIS — E538 Deficiency of other specified B group vitamins: Secondary | ICD-10-CM | POA: Insufficient documentation

## 2015-11-22 DIAGNOSIS — D57219 Sickle-cell/Hb-C disease with crisis, unspecified: Secondary | ICD-10-CM | POA: Diagnosis present

## 2015-11-22 NOTE — ED Notes (Signed)
Pt c/o pain all over x 2 days

## 2015-11-23 ENCOUNTER — Emergency Department (HOSPITAL_COMMUNITY)
Admission: EM | Admit: 2015-11-23 | Discharge: 2015-11-23 | Disposition: A | Discharge status: Home / Self Care | Payer: BLUE CROSS/BLUE SHIELD | Attending: Emergency Medicine | Admitting: Emergency Medicine

## 2015-11-23 DIAGNOSIS — D5700 Hb-SS disease with crisis, unspecified: Secondary | ICD-10-CM

## 2015-11-23 LAB — RETICULOCYTES
RBC.: 2.32 MIL/uL — ABNORMAL LOW (ref 3.87–5.11)
RETIC COUNT ABSOLUTE: 401.4 10*3/uL — AB (ref 19.0–186.0)
Retic Ct Pct: 17.3 % — ABNORMAL HIGH (ref 0.4–3.1)

## 2015-11-23 LAB — COMPREHENSIVE METABOLIC PANEL
ALBUMIN: 3.1 g/dL — AB (ref 3.5–5.0)
ALT: 43 U/L (ref 14–54)
ANION GAP: 8 (ref 5–15)
AST: 95 U/L — AB (ref 15–41)
Alkaline Phosphatase: 125 U/L (ref 38–126)
BILIRUBIN TOTAL: 8.9 mg/dL — AB (ref 0.3–1.2)
BUN: 7 mg/dL (ref 6–20)
CHLORIDE: 109 mmol/L (ref 101–111)
CO2: 21 mmol/L — AB (ref 22–32)
Calcium: 8.5 mg/dL — ABNORMAL LOW (ref 8.9–10.3)
Creatinine, Ser: 0.38 mg/dL — ABNORMAL LOW (ref 0.44–1.00)
GFR calc Af Amer: >60 mL/min (ref >60)
GFR calc non Af Amer: >60 mL/min (ref >60)
GLUCOSE: 85 mg/dL (ref 65–99)
POTASSIUM: 3.7 mmol/L (ref 3.5–5.1)
SODIUM: 138 mmol/L (ref 135–145)
TOTAL PROTEIN: 7.6 g/dL (ref 6.5–8.1)

## 2015-11-23 LAB — CBC WITH DIFFERENTIAL/PLATELET
BASOS PCT: 0 %
Basophils Absolute: 0 10*3/uL (ref 0.0–0.1)
Eosinophils Absolute: 0.1 10*3/uL (ref 0.0–0.7)
Eosinophils Relative: 1 %
HEMATOCRIT: 22.6 % — AB (ref 36.0–46.0)
Hemoglobin: 8 g/dL — ABNORMAL LOW (ref 12.0–15.0)
LYMPHS ABS: 2.4 10*3/uL (ref 0.7–4.0)
Lymphocytes Relative: 34 %
MCH: 34.5 pg — ABNORMAL HIGH (ref 26.0–34.0)
MCHC: 35.4 g/dL (ref 30.0–36.0)
MCV: 97.4 fL (ref 78.0–100.0)
MONOS PCT: 19 %
Monocytes Absolute: 1.3 10*3/uL — ABNORMAL HIGH (ref 0.1–1.0)
NEUTROS ABS: 3.3 10*3/uL (ref 1.7–7.7)
NEUTROS PCT: 46 %
Platelets: DECREASED 10*3/uL (ref 150–400)
RBC: 2.32 MIL/uL — AB (ref 3.87–5.11)
RDW: 19 % — AB (ref 11.5–15.5)
Smear Review: DECREASED
WBC: 7.2 10*3/uL (ref 4.0–10.5)

## 2015-11-23 MED ORDER — ONDANSETRON HCL 4 MG/2ML IJ SOLN
4.0000 mg | Freq: Once | INTRAMUSCULAR | Status: AC
  Administered 2015-11-23: 4 mg via INTRAVENOUS
  Filled 2015-11-23: qty 2

## 2015-11-23 MED ORDER — KETOROLAC TROMETHAMINE 30 MG/ML IJ SOLN
30.0000 mg | Freq: Once | INTRAMUSCULAR | Status: AC
  Administered 2015-11-23: 30 mg via INTRAVENOUS
  Filled 2015-11-23: qty 1

## 2015-11-23 MED ORDER — HYDROMORPHONE HCL 2 MG/ML IJ SOLN
2.0000 mg | INTRAMUSCULAR | Status: AC | PRN
  Administered 2015-11-23 (×3): 2 mg via INTRAVENOUS
  Filled 2015-11-23 (×3): qty 1

## 2015-11-23 NOTE — Discharge Instructions (Signed)
Sickle Cell Anemia, Adult Sickle cell anemia is a condition in which red blood cells have an abnormal "sickle" shape. This abnormal shape shortens the cells' life span, which results in a lower than normal concentration of red blood cells in the blood. The sickle shape also causes the cells to clump together and block free blood flow through the blood vessels. As a result, the tissues and organs of the body do not receive enough oxygen. Sickle cell anemia causes organ damage and pain and increases the risk of infection. CAUSES  Sickle cell anemia is a genetic disorder. Those who receive two copies of the gene have the condition, and those who receive one copy have the trait. RISK FACTORS The sickle cell gene is most common in people whose families originated in [place]. Other areas of the globe where sickle cell trait occurs include the [place], [place] and [place], the [place], and the [place].  SIGNS AND SYMPTOMS  Pain, especially in the extremities, back, chest, or abdomen (common). The pain may start suddenly or may develop following an illness, especially if there is dehydration. Pain can also occur due to overexertion or exposure to extreme temperature changes.  Frequent severe bacterial infections, especially certain types of pneumonia and meningitis.  Pain and swelling in the hands and feet.  Decreased activity.   Loss of appetite.   Change in behavior.  Headaches.  Seizures.  Shortness of breath or difficulty breathing.  Vision changes.  Skin ulcers. Those with the trait may not have symptoms or they may have mild symptoms.  DIAGNOSIS  Sickle cell anemia is diagnosed with blood tests that demonstrate the genetic trait. It is often diagnosed during the newborn period, due to mandatory testing nationwide. A variety of blood tests, X-rays, CT scans, MRI scans, ultrasounds, and lung function tests may also be done to monitor the condition. TREATMENT  Sickle  cell anemia may be treated with:  Medicines. You may be given pain medicines, antibiotic medicines (to treat and prevent infections) or medicines to increase the production of certain types of hemoglobin.  Fluids.  Oxygen.  Blood transfusions. HOME CARE INSTRUCTIONS   Drink enough fluid to keep your urine clear or pale yellow. Increase your fluid intake in hot weather and during exercise.  Do not smoke. Smoking lowers oxygen levels in the blood.   Only take over-the-counter or prescription medicines for pain, fever, or discomfort as directed by your health care provider.  Take antibiotics as directed by your health care provider. Make sure you finish them it even if you start to feel better.   Take supplements as directed by your health care provider.   Consider wearing a medical alert bracelet. This tells anyone caring for you in an emergency of your condition.   When traveling, keep your medical information, health care provider's names, and the medicines you take with you at all times.   If you develop a fever, do not take medicines to reduce the fever right away. This could cover up a problem that is developing. Notify your health care provider.  Keep all follow-up appointments with your health care provider. Sickle cell anemia requires regular medical care. SEEK MEDICAL CARE IF: You have a fever. SEEK IMMEDIATE MEDICAL CARE IF:   You feel dizzy or faint.   You have new abdominal pain, especially on the left side near the stomach area.   You develop a persistent, often uncomfortable and painful penile erection (priapism). If this is not treated immediately it   will lead to impotence.   You have numbness your arms or legs or you have a hard time moving them.   You have a hard time with speech.   You have a fever or persistent symptoms for more than 2-3 days.   You have a fever and your symptoms suddenly get worse.   You have signs or symptoms of infection.  These include:   Chills.   Abnormal tiredness (lethargy).   Irritability.   Poor eating.   Vomiting.   You develop pain that is not helped with medicine.   You develop shortness of breath.  You have pain in your chest.   You are coughing up pus-like or bloody sputum.   You develop a stiff neck.  Your feet or hands swell or have pain.  Your abdomen appears bloated.  You develop joint pain. MAKE SURE YOU:  Understand these instructions.   This information is not intended to replace advice given to you by your health care provider. Make sure you discuss any questions you have with your health care provider.   Document Released: 12/19/2005 Document Revised: 10/01/2014 Document Reviewed: 04/22/2013 Elsevier Interactive Patient Education 2016 [employer]  

## 2015-11-23 NOTE — ED Provider Notes (Signed)
CSN: [id number]     Arrival date & time 11/22/15  2331 History  By signing my name below, I, Dawn [name], attest that this documentation has been prepared under the direction and in the presence of Dawn Hacker, MD. Electronically Signed: Helane [name], ED Scribe. 11/23/2015. 1:02 AM.     Chief Complaint  Patient presents with  . Sickle Cell Pain Crisis   The history is provided by the patient. No language interpreter was used.   HPI Comments: Dawn [name] is a 35 y.o. female with a PMHx of sickle cell disease type S, left atrial dilation, and blood transfusion who presents to the Emergency Department complaining of sickle cell pain crisis onset 2 days ago. She currently rates ehr pain as an 8/10. She reports associated pains to the BUE and BLE. She states she is on short-acting oxy and long-acting MS Contin, which she has been taking without relief. She notes that her eyes are typically yellow whenever she has pain crises. She also states she typically has to receive phenergan during these crises as well. She notes she also takes Network engineer. Pt denies CP, fever, and SOB.   Past Medical History  Diagnosis Date  . Sickle cell disease ([place])   . Sickle cell disease, type S ([place])   . Blood transfusion   . 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 HCAP (healthcare-associated pneumonia) 05/19/2013  . Sickle cell anemia ([facility])   . 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 Vitamin B12 deficiency 07/07/2015   Past Surgical History  Procedure Laterality Date  . Cholecystectomy    .  left knee acl reconstruction    . Cesarean section      x 2  . Portacath placement    . Portacath placement Left 02/23/2013    Procedure: INSERTION PORT-A-CATH;  Surgeon: Donato Heinz, MD;  Location: [facility];  Service: General;  Laterality: Left;  . Port-a-cath removal Left 07/29/2013    Procedure: REMOVAL PORT-A-CATH;  Surgeon: Scherry Ran, MD;  Location: [facility];  Service: General;  Laterality: Left;  .  Tee without cardioversion N/A 08/07/2013    Procedure: TRANSESOPHAGEAL ECHOCARDIOGRAM (TEE);  Surgeon: Arnoldo Lenis, MD;  Location: [facility];  Service: Cardiology;  Laterality: N/A;   Family History  Problem Relation Age of Onset  . Sickle cell trait Mother   . Sickle cell trait Father   . Hypertension Father   . Diabetes Father   . Sickle cell trait Sister   . Cancer Paternal Aunt     Breast   Social History  Substance Use Topics  . Smoking status: Never Smoker   . Smokeless tobacco: Former Systems developer  . Alcohol Use: No   OB History    Gravida Para Term Preterm AB TAB SAB Ectopic Multiple Living   1 1  1      1      Review of Systems  Constitutional: Negative for fever.  Respiratory: Negative for shortness of breath.   Cardiovascular: Negative for chest pain.  Musculoskeletal: Positive for myalgias.  All other systems reviewed and are negative.   Allergies  Desferal; Latex; Lisinopril; and Tape  Home Medications   Prior to Admission medications   Medication Sig Start Date End Date Taking? Authorizing Provider  folic acid (FOLVITE) 1 MG tablet Take 1 tablet (1 mg total) by mouth daily. 08/16/15   [name], FNP  hydroxyurea (HYDREA) 500 MG capsule Take 2 capsules (1,000 mg total) by mouth daily. May take  with food to minimize GI side effects. 09/05/15   [name], FNP  ibuprofen (ADVIL,MOTRIN) 200 MG tablet Take 400 mg by mouth every 6 (six) hours as needed for moderate pain.     Historical Provider, MD  loratadine (CLARITIN) 10 MG tablet Take 10 mg by mouth daily.    Historical Provider, MD  morphine (MS CONTIN) 15 MG 12 hr tablet Take 1 tablet (15 mg total) by mouth every 12 (twelve) hours. 10/06/15   [name], FNP  Multiple Vitamin (MULTIVITAMIN WITH MINERALS) TABS tablet Take 1 tablet by mouth daily.    Historical Provider, MD  Oxycodone HCl 10 MG TABS Take 1 tablet (10 mg total) by mouth every 6 (six) hours as needed. Patient taking  differently: Take 10 mg by mouth every 6 (six) hours as needed (breakthrough pain).  10/06/15   [name], FNP  promethazine (PHENERGAN) 12.5 MG tablet Take 1 tablet (12.5 mg total) by mouth every 6 (six) hours as needed for nausea. 08/31/15   [name], MD  Vitamin D, Ergocalciferol, (DRISDOL) 50000 UNITS CAPS capsule Take 1 capsule (50,000 Units total) by mouth every 7 (seven) days. Takes on Sundays. 08/16/15   [name], FNP   BP 124/75 mmHg  Pulse 86  Temp(Src) 97.7 F (36.5 C) (Oral)  Resp 16  Ht 5\' 3"  (1.6 m)  Wt 161 lb (73.029 kg)  BMI 28.53 kg/m2  SpO2 93% Physical Exam  Constitutional: She is oriented to person, place, and time. She appears well-developed and well-nourished. No distress.  HENT:  Head: Normocephalic and atraumatic.  Eyes:  Icteric sclera  Cardiovascular: Normal rate, regular rhythm and normal heart sounds.   No murmur heard. Pulmonary/Chest: Effort normal and breath sounds normal. No respiratory distress. She has no wheezes.  Abdominal: Soft. There is no tenderness.  Musculoskeletal:  Normal range of motion, normal gait, no obvious deformities, good radial and DP pulses  Neurological: She is alert and oriented to person, place, and time.  Skin: Skin is warm and dry.  Psychiatric: She has a normal mood and affect.  Nursing note and vitals reviewed.   ED Course  Procedures  DIAGNOSTIC STUDIES: Oxygen Saturation is 97% on RA, adequate by my interpretation.    COORDINATION OF CARE: 12:57 AM - Discussed plans to order medication for pain as well as phenergan. Pt advised of plan for treatment and pt agrees.  Labs Review Labs Reviewed  CBC WITH DIFFERENTIAL/PLATELET - Abnormal; Notable for the following:    RBC 2.32 (*)    Hemoglobin 8.0 (*)    HCT 22.6 (*)    MCH 34.5 (*)    RDW 19.0 (*)    Monocytes Absolute 1.3 (*)    All other components within normal limits  COMPREHENSIVE METABOLIC PANEL - Abnormal; Notable for the following:     CO2 21 (*)    Creatinine, Ser 0.38 (*)    Calcium 8.5 (*)    Albumin 3.1 (*)    AST 95 (*)    Total Bilirubin 8.9 (*)    All other components within normal limits  RETICULOCYTES - Abnormal; Notable for the following:    Retic Ct Pct 17.3 (*)    RBC. 2.32 (*)    Retic Count, Manual 401.4 (*)    All other components within normal limits    Imaging Review No results found. I have personally reviewed and evaluated these images and lab results as part of my medical decision-making.   EKG  Interpretation None     Medications  HYDROmorphone (DILAUDID) injection 2 mg (2 mg Intravenous Given 11/23/15 0330)  ketorolac (TORADOL) 30 MG/ML injection 30 mg (30 mg Intravenous Given 11/23/15 0152)  ondansetron (ZOFRAN) injection 4 mg (4 mg Intravenous Given 11/23/15 0152)    MDM   Final diagnoses:  Sickle cell crisis ([place])    Patient presents with symptoms consistent with prior sickle cell crises. Nontoxic on exam. Vital signs reassuring. No signs or symptoms of acute chest. Patient does have icteric sclera which she states is normal for her when she is in crisis. Lab work obtained and is largely at the patient's baseline including hemoglobin and bilirubin. Bilirubin today is 8.9. During crises that is normally between 7 and 8.  Patient was given pain medication while in the ER with improvement of her symptoms. On recheck, her pain is now 4 out of 10. She states that she feels like she can go home.  After history, exam, and medical workup I feel the patient has been appropriately medically screened and is safe for discharge home. Pertinent diagnoses were discussed with the patient. Patient was given return precautions.  I personally performed the services described in this documentation, which was scribed in my presence. The recorded information has been reviewed and is accurate.   Dawn Hacker, MD 11/23/15 [phone]

## 2015-12-07 ENCOUNTER — Encounter (HOSPITAL_COMMUNITY): Payer: Self-pay

## 2015-12-07 ENCOUNTER — Emergency Department (HOSPITAL_COMMUNITY)
Admission: EM | Admit: 2015-12-07 | Discharge: 2015-12-07 | Disposition: A | Discharge status: Home / Self Care | Payer: BLUE CROSS/BLUE SHIELD | Attending: Emergency Medicine | Admitting: Emergency Medicine

## 2015-12-07 DIAGNOSIS — M79605 Pain in left leg: Secondary | ICD-10-CM | POA: Diagnosis not present

## 2015-12-07 DIAGNOSIS — D5700 Hb-SS disease with crisis, unspecified: Secondary | ICD-10-CM | POA: Diagnosis not present

## 2015-12-07 DIAGNOSIS — Z79899 Other long term (current) drug therapy: Secondary | ICD-10-CM | POA: Insufficient documentation

## 2015-12-07 DIAGNOSIS — M79601 Pain in right arm: Secondary | ICD-10-CM | POA: Diagnosis present

## 2015-12-07 DIAGNOSIS — M79604 Pain in right leg: Secondary | ICD-10-CM | POA: Insufficient documentation

## 2015-12-07 DIAGNOSIS — M79602 Pain in left arm: Secondary | ICD-10-CM | POA: Insufficient documentation

## 2015-12-07 DIAGNOSIS — M545 Low back pain: Secondary | ICD-10-CM | POA: Diagnosis not present

## 2015-12-07 LAB — COMPREHENSIVE METABOLIC PANEL
ALK PHOS: 122 U/L (ref 38–126)
ALT: 64 U/L — AB (ref 14–54)
AST: 190 U/L — ABNORMAL HIGH (ref 15–41)
Albumin: 3 g/dL — ABNORMAL LOW (ref 3.5–5.0)
Anion gap: 5 (ref 5–15)
BILIRUBIN TOTAL: 9.5 mg/dL — AB (ref 0.3–1.2)
BUN: 6 mg/dL (ref 6–20)
CALCIUM: 8.4 mg/dL — AB (ref 8.9–10.3)
CO2: 25 mmol/L (ref 22–32)
CREATININE: 0.45 mg/dL (ref 0.44–1.00)
Chloride: 108 mmol/L (ref 101–111)
Glucose, Bld: 84 mg/dL (ref 65–99)
Potassium: 3.3 mmol/L — ABNORMAL LOW (ref 3.5–5.1)
Sodium: 138 mmol/L (ref 135–145)
TOTAL PROTEIN: 7.6 g/dL (ref 6.5–8.1)

## 2015-12-07 LAB — URINE MICROSCOPIC-ADD ON: WBC UA: NONE SEEN WBC/hpf (ref 0–5)

## 2015-12-07 LAB — CBC WITH DIFFERENTIAL/PLATELET
Basophils Absolute: 0 10*3/uL (ref 0.0–0.1)
Basophils Relative: 0 %
EOS ABS: 0 10*3/uL (ref 0.0–0.7)
Eosinophils Relative: 1 %
HEMATOCRIT: 22.6 % — AB (ref 36.0–46.0)
HEMOGLOBIN: 8.3 g/dL — AB (ref 12.0–15.0)
LYMPHS ABS: 2.3 10*3/uL (ref 0.7–4.0)
Lymphocytes Relative: 33 %
MCH: 36.7 pg — AB (ref 26.0–34.0)
MCHC: 36.7 g/dL — ABNORMAL HIGH (ref 30.0–36.0)
MCV: 100 fL (ref 78.0–100.0)
MONOS PCT: 14 %
Monocytes Absolute: 1 10*3/uL (ref 0.1–1.0)
NEUTROS ABS: 3.6 10*3/uL (ref 1.7–7.7)
NEUTROS PCT: 52 %
PLATELETS: 169 10*3/uL (ref 150–400)
RBC: 2.26 MIL/uL — ABNORMAL LOW (ref 3.87–5.11)
RDW: 19.9 % — ABNORMAL HIGH (ref 11.5–15.5)
WBC: 6.8 10*3/uL (ref 4.0–10.5)

## 2015-12-07 LAB — URINALYSIS, ROUTINE W REFLEX MICROSCOPIC
GLUCOSE, UA: NEGATIVE mg/dL
Ketones, ur: NEGATIVE mg/dL
Leukocytes, UA: NEGATIVE
Nitrite: NEGATIVE
PH: 6.5 (ref 5.0–8.0)
PROTEIN: 100 mg/dL — AB
SPECIFIC GRAVITY, URINE: 1.01 (ref 1.005–1.030)

## 2015-12-07 LAB — RETICULOCYTES
RBC.: 2.26 MIL/uL — AB (ref 3.87–5.11)
RETIC CT PCT: 22.1 % — AB (ref 0.4–3.1)
Retic Count, Absolute: 499.5 10*3/uL — ABNORMAL HIGH (ref 19.0–186.0)

## 2015-12-07 MED ORDER — HYDROMORPHONE HCL 2 MG/ML IJ SOLN
2.0000 mg | Freq: Once | INTRAMUSCULAR | Status: AC
  Administered 2015-12-07: 2 mg via INTRAVENOUS
  Filled 2015-12-07: qty 1

## 2015-12-07 MED ORDER — SODIUM CHLORIDE 0.9 % IV BOLUS (SEPSIS)
1000.0000 mL | Freq: Once | INTRAVENOUS | Status: AC
Start: 2015-12-07 — End: 2015-12-07
  Administered 2015-12-07: 1000 mL via INTRAVENOUS

## 2015-12-07 MED ORDER — PROMETHAZINE HCL 25 MG PO TABS: 25.0000 mg | ORAL_TABLET | Freq: Four times a day (QID) | ORAL | Status: DC | PRN

## 2015-12-07 MED ORDER — HYDROMORPHONE HCL 1 MG/ML IJ SOLN
1.0000 mg | Freq: Once | INTRAMUSCULAR | Status: AC
  Administered 2015-12-07: 1 mg via INTRAVENOUS
  Filled 2015-12-07: qty 1

## 2015-12-07 MED ORDER — PROMETHAZINE HCL 25 MG/ML IJ SOLN
25.0000 mg | Freq: Once | INTRAMUSCULAR | Status: AC
  Administered 2015-12-07: 25 mg via INTRAVENOUS
  Filled 2015-12-07: qty 1

## 2015-12-07 MED ORDER — DIPHENHYDRAMINE HCL 50 MG/ML IJ SOLN
25.0000 mg | Freq: Once | INTRAMUSCULAR | Status: AC
  Administered 2015-12-07: 25 mg via INTRAVENOUS
  Filled 2015-12-07: qty 1

## 2015-12-07 MED ORDER — ONDANSETRON HCL 4 MG/2ML IJ SOLN
4.0000 mg | Freq: Once | INTRAMUSCULAR | Status: AC
  Administered 2015-12-07: 4 mg via INTRAVENOUS
  Filled 2015-12-07: qty 2

## 2015-12-07 NOTE — Discharge Instructions (Signed)
Follow up with your md as needed °

## 2015-12-07 NOTE — ED Notes (Signed)
Pt reports has sickle cell disease and c/o pain in arms, legs, and back since Sunday.

## 2015-12-07 NOTE — ED Provider Notes (Signed)
CSN: [id number]     Arrival date & time 12/07/15  0818 History  By signing my name below, I, Dawn [name], attest that this documentation has been prepared under the direction and in the presence of physician practitioner, [name], MD,. Electronically Signed: Dora [name], Scribe. 12/07/2015. 8:59 AM.     Chief Complaint  Patient presents with  . Pain    HPI Comments: Pt complains of extremity and lower back pains consistent with her sickle cell condition  Patient is a 35 y.o. female presenting with extremity pain and sickle cell pain. The history is provided by the patient. No language interpreter was used.  Extremity Pain This is a recurrent problem. The current episode started more than 2 days ago. The problem occurs constantly. Pertinent negatives include no shortness of breath. Nothing aggravates the symptoms. The symptoms are relieved by medications.  Sickle Cell Pain Crisis Pain location: arms and legs. Severity:  Moderate Similar to previous crisis episodes: yes   Timing:  Constant Progression:  Waxing and waning Chronicity:  Recurrent Associated symptoms: no congestion, no cough, no fatigue and no shortness of breath      HPI Comments: Dawn [name] is a 35 y.o. female with h/o sickle cell disease who presents to the Emergency Department complaining of extremity pains consistent with sickle cell disease for the last 3 days. Pt endorses significant pain in her bilateral arms and legs. She also notes pain in her lower back. She notes mild left thigh swelling but denies left calf swelling. Pt takes oxycodone and MS contin for her pains. Her PCP is Dr. Doreene Burke. Pt resides in [place]. She denies numbness or any other associated symptoms.  Past Medical History  Diagnosis Date  . Sickle cell disease ([place])   . Sickle cell disease, type S ([place])   . Blood transfusion   . 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 HCAP (healthcare-associated pneumonia) 05/19/2013  . Sickle  cell anemia ([facility])   . 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 Vitamin B12 deficiency 07/07/2015   Past Surgical History  Procedure Laterality Date  . Cholecystectomy    .  left knee acl reconstruction    . Cesarean section      x 2  . Portacath placement    . Portacath placement Left 02/23/2013    Procedure: INSERTION PORT-A-CATH;  Surgeon: Donato Heinz, MD;  Location: [facility];  Service: General;  Laterality: Left;  . Port-a-cath removal Left 07/29/2013    Procedure: REMOVAL PORT-A-CATH;  Surgeon: Scherry Ran, MD;  Location: [facility];  Service: General;  Laterality: Left;  . Tee without cardioversion N/A 08/07/2013    Procedure: TRANSESOPHAGEAL ECHOCARDIOGRAM (TEE);  Surgeon: Arnoldo Lenis, MD;  Location: [facility];  Service: Cardiology;  Laterality: N/A;   Family History  Problem Relation Age of Onset  . Sickle cell trait Mother   . Sickle cell trait Father   . Hypertension Father   . Diabetes Father   . Sickle cell trait Sister   . Cancer Paternal Aunt     Breast   Social History  Substance Use Topics  . Smoking status: Never Smoker   . Smokeless tobacco: Former Systems developer  . Alcohol Use: No   OB History    Gravida Para Term Preterm AB TAB SAB Ectopic Multiple Living   1 1  1      1      Review of Systems  Constitutional: Negative for appetite change and fatigue.  HENT: Negative for  congestion, ear discharge and sinus pressure.   Eyes: Negative for discharge.  Respiratory: Negative for cough and shortness of breath.   Gastrointestinal: Negative for diarrhea.  Genitourinary: Negative for frequency and hematuria.  Musculoskeletal: Positive for joint swelling and arthralgias.  Skin: Negative for rash.  Neurological: Negative for seizures.  Psychiatric/Behavioral: Negative for hallucinations.      Allergies  Desferal; Latex; Lisinopril; and Tape  Home Medications   Prior to Admission medications   Medication Sig Start Date End Date Taking? Authorizing  Provider  folic acid (FOLVITE) 1 MG tablet Take 1 tablet (1 mg total) by mouth daily. 08/16/15   [name], FNP  hydroxyurea (HYDREA) 500 MG capsule Take 2 capsules (1,000 mg total) by mouth daily. May take with food to minimize GI side effects. 09/05/15   [name], FNP  ibuprofen (ADVIL,MOTRIN) 200 MG tablet Take 400 mg by mouth every 6 (six) hours as needed for moderate pain.     Historical Provider, MD  loratadine (CLARITIN) 10 MG tablet Take 10 mg by mouth daily.    Historical Provider, MD  morphine (MS CONTIN) 15 MG 12 hr tablet Take 1 tablet (15 mg total) by mouth every 12 (twelve) hours. 10/06/15   [name], FNP  Multiple Vitamin (MULTIVITAMIN WITH MINERALS) TABS tablet Take 1 tablet by mouth daily.    Historical Provider, MD  Oxycodone HCl 10 MG TABS Take 1 tablet (10 mg total) by mouth every 6 (six) hours as needed. Patient taking differently: Take 10 mg by mouth every 6 (six) hours as needed (breakthrough pain).  10/06/15   [name], FNP  promethazine (PHENERGAN) 12.5 MG tablet Take 1 tablet (12.5 mg total) by mouth every 6 (six) hours as needed for nausea. 08/31/15   [name], MD  Vitamin D, Ergocalciferol, (DRISDOL) 50000 UNITS CAPS capsule Take 1 capsule (50,000 Units total) by mouth every 7 (seven) days. Takes on Sundays. 08/16/15   [name], FNP   BP 157/77 mmHg  Pulse 99  Temp(Src) 98.7 F (37.1 C) (Oral)  Resp 18  Ht 5\' 3"  (1.6 m)  Wt 168 lb (76.204 kg)  BMI 29.77 kg/m2  SpO2 95% Physical Exam  Constitutional: She is oriented to person, place, and time. She appears well-developed.  HENT:  Head: Normocephalic.  Eyes: Conjunctivae and EOM are normal. No scleral icterus.  Neck: Neck supple. No tracheal deviation present. No thyromegaly present.  Cardiovascular: Normal rate and regular rhythm.  Exam reveals no gallop and no friction rub.   No murmur heard. Pulmonary/Chest: No stridor. She has no wheezes. She has no rales. She exhibits  no tenderness.  Abdominal: She exhibits no distension. There is no tenderness. There is no rebound.  Musculoskeletal: Normal range of motion. She exhibits no edema.  Mild/moderate tenderness to palpation upper and lower extremities  Lymphadenopathy:    She has no cervical adenopathy.  Neurological: She is oriented to person, place, and time. She exhibits normal muscle tone. Coordination normal.  Skin: Skin is warm. No rash noted. No erythema.  Psychiatric: She has a normal mood and affect. Her behavior is normal.    ED Course  Procedures (including critical care time)  DIAGNOSTIC STUDIES: Oxygen Saturation is 95% on RA, adequate by my interpretation.    COORDINATION OF CARE: 8:59 AM Will administer fluids, Dilaudid, and Zofran. Will order Urinalysis and blood work. Discussed treatment plan with pt at bedside and pt agreed to plan.   Labs Review Labs Reviewed -  No data to display  Imaging Review No results found. I have personally reviewed and evaluated these lab results as part of my medical decision-making.   EKG Interpretation None      MDM   Final diagnoses:  None    Diagnosis sickle cell crisis. Patient improved with treatment in emergency department will be sent home with Phenergan and she will take her own pain medicines and follow-up as needed  The chart was scribed for me under my direct supervision.  I personally performed the history, physical, and medical decision making and all procedures in the evaluation of this patient.[name], MD 12/07/15 [phone]

## 2015-12-09 ENCOUNTER — Inpatient Hospital Stay (HOSPITAL_COMMUNITY)
Admission: EM | Admit: 2015-12-09 | Discharge: 2015-12-14 | DRG: 812 | Disposition: A | Discharge status: Home / Self Care | Payer: BLUE CROSS/BLUE SHIELD | Attending: Internal Medicine | Admitting: Internal Medicine

## 2015-12-09 ENCOUNTER — Emergency Department (HOSPITAL_COMMUNITY): Payer: BLUE CROSS/BLUE SHIELD

## 2015-12-09 ENCOUNTER — Encounter (HOSPITAL_COMMUNITY): Payer: Self-pay | Admitting: *Deleted

## 2015-12-09 DIAGNOSIS — G894 Chronic pain syndrome: Secondary | ICD-10-CM | POA: Diagnosis not present

## 2015-12-09 DIAGNOSIS — D5700 Hb-SS disease with crisis, unspecified: Secondary | ICD-10-CM | POA: Diagnosis not present

## 2015-12-09 DIAGNOSIS — Z8249 Family history of ischemic heart disease and other diseases of the circulatory system: Secondary | ICD-10-CM

## 2015-12-09 DIAGNOSIS — Z9049 Acquired absence of other specified parts of digestive tract: Secondary | ICD-10-CM

## 2015-12-09 DIAGNOSIS — D571 Sickle-cell disease without crisis: Secondary | ICD-10-CM | POA: Diagnosis present

## 2015-12-09 DIAGNOSIS — Z809 Family history of malignant neoplasm, unspecified: Secondary | ICD-10-CM

## 2015-12-09 DIAGNOSIS — Z833 Family history of diabetes mellitus: Secondary | ICD-10-CM

## 2015-12-09 LAB — COMPREHENSIVE METABOLIC PANEL
ALBUMIN: 3.3 g/dL — AB (ref 3.5–5.0)
ALT: 104 U/L — ABNORMAL HIGH (ref 14–54)
ANION GAP: 8 (ref 5–15)
AST: 376 U/L — ABNORMAL HIGH (ref 15–41)
Alkaline Phosphatase: 129 U/L — ABNORMAL HIGH (ref 38–126)
BUN: 6 mg/dL (ref 6–20)
CHLORIDE: 104 mmol/L (ref 101–111)
CO2: 23 mmol/L (ref 22–32)
Calcium: 8.8 mg/dL — ABNORMAL LOW (ref 8.9–10.3)
Creatinine, Ser: 0.4 mg/dL — ABNORMAL LOW (ref 0.44–1.00)
GFR calc non Af Amer: >60 mL/min (ref >60)
GLUCOSE: 94 mg/dL (ref 65–99)
POTASSIUM: 3.6 mmol/L (ref 3.5–5.1)
SODIUM: 135 mmol/L (ref 135–145)
Total Bilirubin: 10.9 mg/dL — ABNORMAL HIGH (ref 0.3–1.2)
Total Protein: 8.3 g/dL — ABNORMAL HIGH (ref 6.5–8.1)

## 2015-12-09 LAB — CBC WITH DIFFERENTIAL/PLATELET
BASOS PCT: 0 %
Basophils Absolute: 0 10*3/uL (ref 0.0–0.1)
Eosinophils Absolute: 0.1 10*3/uL (ref 0.0–0.7)
Eosinophils Relative: 1 %
HEMATOCRIT: 26.1 % — AB (ref 36.0–46.0)
HEMOGLOBIN: 9.5 g/dL — AB (ref 12.0–15.0)
LYMPHS ABS: 1.8 10*3/uL (ref 0.7–4.0)
LYMPHS PCT: 25 %
MCH: 36.3 pg — AB (ref 26.0–34.0)
MCHC: 36.4 g/dL — ABNORMAL HIGH (ref 30.0–36.0)
MCV: 99.6 fL (ref 78.0–100.0)
MONO ABS: 1.2 10*3/uL — AB (ref 0.1–1.0)
MONOS PCT: 16 %
NEUTROS ABS: 4.3 10*3/uL (ref 1.7–7.7)
NEUTROS PCT: 58 %
PLATELETS: 173 10*3/uL (ref 150–400)
RBC: 2.62 MIL/uL — ABNORMAL LOW (ref 3.87–5.11)
RDW: 21 % — ABNORMAL HIGH (ref 11.5–15.5)
WBC: 7.4 10*3/uL (ref 4.0–10.5)

## 2015-12-09 LAB — RETICULOCYTES
RBC.: 2.62 MIL/uL — ABNORMAL LOW (ref 3.87–5.11)
Retic Ct Pct: >23 % — ABNORMAL HIGH (ref 0.4–3.1)

## 2015-12-09 LAB — TROPONIN I

## 2015-12-09 MED ORDER — HYDROXYUREA 500 MG PO CAPS
1000.0000 mg | ORAL_CAPSULE | Freq: Every day | ORAL | Status: DC
  Administered 2015-12-10 – 2015-12-14 (×5): 1000 mg via ORAL
  Filled 2015-12-09 (×5): qty 2

## 2015-12-09 MED ORDER — HYDROMORPHONE HCL 2 MG/ML IJ SOLN: 0.0250 mg/kg | INTRAMUSCULAR | Status: AC

## 2015-12-09 MED ORDER — FOLIC ACID 1 MG PO TABS
1.0000 mg | ORAL_TABLET | Freq: Every day | ORAL | Status: DC
  Administered 2015-12-10 – 2015-12-14 (×5): 1 mg via ORAL
  Filled 2015-12-09 (×5): qty 1

## 2015-12-09 MED ORDER — LEVONORGESTREL 20 MCG/24HR IU IUD: 1.0000 | INTRAUTERINE_SYSTEM | Freq: Once | INTRAUTERINE | Status: DC

## 2015-12-09 MED ORDER — HYDROMORPHONE HCL 2 MG/ML IJ SOLN
0.0375 mg/kg | INTRAMUSCULAR | Status: AC | PRN
  Administered 2015-12-09: 2.8 mg via INTRAVENOUS
  Filled 2015-12-09: qty 2

## 2015-12-09 MED ORDER — HYDROMORPHONE HCL 2 MG/ML IJ SOLN
0.0313 mg/kg | INTRAMUSCULAR | Status: AC | PRN
  Filled 2015-12-09: qty 2

## 2015-12-09 MED ORDER — HYDROMORPHONE HCL 2 MG/ML IJ SOLN
0.0313 mg/kg | INTRAMUSCULAR | Status: AC | PRN
  Administered 2015-12-09: 2.4 mg via INTRAVENOUS

## 2015-12-09 MED ORDER — DIPHENHYDRAMINE HCL 25 MG PO CAPS
25.0000 mg | ORAL_CAPSULE | ORAL | Status: DC | PRN
  Administered 2015-12-09 – 2015-12-12 (×2): 50 mg via ORAL
  Filled 2015-12-09: qty 2
  Filled 2015-12-09: qty 1
  Filled 2015-12-09: qty 2

## 2015-12-09 MED ORDER — HYDROMORPHONE HCL 2 MG/ML IJ SOLN
0.0250 mg/kg | INTRAMUSCULAR | Status: AC
  Administered 2015-12-09: 1.9 mg via INTRAVENOUS
  Filled 2015-12-09: qty 1

## 2015-12-09 MED ORDER — ONDANSETRON HCL 4 MG/2ML IJ SOLN: 4.0000 mg | Freq: Three times a day (TID) | INTRAMUSCULAR | Status: DC | PRN

## 2015-12-09 MED ORDER — HYDROMORPHONE HCL 1 MG/ML IJ SOLN
1.0000 mg | INTRAMUSCULAR | Status: DC | PRN
  Administered 2015-12-09 – 2015-12-10 (×6): 1 mg via INTRAVENOUS
  Filled 2015-12-09 (×6): qty 1

## 2015-12-09 MED ORDER — PROMETHAZINE HCL 12.5 MG PO TABS
25.0000 mg | ORAL_TABLET | ORAL | Status: DC | PRN
  Administered 2015-12-09 – 2015-12-14 (×9): 25 mg via ORAL
  Filled 2015-12-09 (×9): qty 2

## 2015-12-09 MED ORDER — DEXTROSE-NACL 5-0.45 % IV SOLN
INTRAVENOUS | Status: DC
  Administered 2015-12-09 – 2015-12-14 (×13): via INTRAVENOUS

## 2015-12-09 MED ORDER — HYDROMORPHONE HCL 2 MG/ML IJ SOLN: 0.0375 mg/kg | INTRAMUSCULAR | Status: AC | PRN

## 2015-12-09 MED ORDER — ENOXAPARIN SODIUM 40 MG/0.4ML ~~LOC~~ SOLN
40.0000 mg | SUBCUTANEOUS | Status: DC
  Administered 2015-12-09 – 2015-12-13 (×5): 40 mg via SUBCUTANEOUS
  Filled 2015-12-09 (×5): qty 0.4

## 2015-12-09 MED ORDER — KETOROLAC TROMETHAMINE 30 MG/ML IJ SOLN
30.0000 mg | INTRAMUSCULAR | Status: AC
  Administered 2015-12-09: 30 mg via INTRAVENOUS
  Filled 2015-12-09: qty 1

## 2015-12-09 NOTE — H&P (Signed)
[facility] History and Physical  Dawn [name] [id number] DOB: [birth year]    PCP:   Angelica Chessman, MD   Chief Complaint: Sickle cell crisis   HPI: Dawn [name] is an 35 y.o. female with hx of sickle cell disease but infrequent sickle cell crisis, hx of reactive depression, atrial enlargement, presents today to the ER with usual back and leg pain typical of sickle cell crisis.  There has been no chest pain or shortness of breath, fever, or chills.  Evaluation in the ER included a reticulocyte count of greater than 23 percent,  Hb of 9.5 g per dL,  with normal renal function test. In the ER,  IVF and IV pain medication was given, but patient continue to have pain, and hospitalist was asked to admit for sickle cell crisis.  Rewiew of Systems:  Constitutional: Negative for malaise, fever and chills. No significant weight loss or weight gain Eyes: Negative for eye pain, redness and discharge, diplopia, visual changes, or flashes of light. ENMT: Negative for ear pain, hoarseness, nasal congestion, sinus pressure and sore throat. No headaches; tinnitus, drooling, or problem swallowing. Cardiovascular: Negative for chest pain, palpitations, diaphoresis, dyspnea and peripheral edema. ; No orthopnea, PND Respiratory: Negative for cough, hemoptysis, wheezing and stridor. No pleuritic chestpain. Gastrointestinal: Negative for nausea, vomiting, diarrhea, constipation, abdominal pain, melena, blood in stool, hematemesis, jaundice and rectal bleeding.    Genitourinary: Negative for frequency, dysuria, incontinence,flank pain and hematuria; Musculoskeletal: Negative for swelling and trauma.;  Skin: . Negative for pruritus, rash, abrasions, bruising and skin lesion.; ulcerations Neuro: Negative for headache, lightheadedness and neck stiffness. Negative for weakness, altered level of consciousness , altered mental status, extremity weakness, burning feet, involuntary movement, seizure and syncope.   Psych: negative for anxiety, depression, insomnia, tearfulness, panic attacks, hallucinations, paranoia, suicidal or homicidal ideation    Past Medical History  Diagnosis Date  . Sickle cell disease ([place])   . 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 Blood transfusion   . 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 HCAP (healthcare-associated pneumonia) 05/19/2013  . Sickle cell anemia (HCC)   . 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 Vitamin B12 deficiency 07/07/2015    Past Surgical History  Procedure Laterality Date  . Cholecystectomy    .  left knee acl reconstruction    . Cesarean section      x 2  . Portacath placement    . Portacath placement Left 02/23/2013    Procedure: INSERTION PORT-A-CATH;  Surgeon: Donato Heinz, MD;  Location: [facility];  Service: General;  Laterality: Left;  . Port-a-cath removal Left 07/29/2013    Procedure: REMOVAL PORT-A-CATH;  Surgeon: Scherry Ran, MD;  Location: [facility];  Service: General;  Laterality: Left;  . Tee without cardioversion N/A 08/07/2013    Procedure: TRANSESOPHAGEAL ECHOCARDIOGRAM (TEE);  Surgeon: Arnoldo Lenis, MD;  Location: [facility];  Service: Cardiology;  Laterality: N/A;    Medications:  HOME MEDS: Prior to Admission medications   Medication Sig Start Date End Date Taking? Authorizing Provider  acetaminophen (TYLENOL) 500 MG tablet Take 500 mg by mouth every 6 (six) hours as needed.   Yes Historical Provider, MD  folic acid (FOLVITE) 1 MG tablet Take 1 tablet (1 mg total) by mouth daily. 08/16/15  Yes [name], FNP  hydroxyurea (HYDREA) 500 MG capsule Take 2 capsules (1,000 mg total) by mouth daily. May take with food to minimize GI side effects. 09/05/15  Yes Asencion Partridge  Smith Robert, FNP  levonorgestrel (MIRENA) 20 MCG/24HR IUD 1 each by Intrauterine route once.   Yes Historical Provider, MD  loratadine (CLARITIN) 10 MG tablet Take 10 mg by mouth daily.   Yes Historical Provider, MD  morphine (MS CONTIN) 15 MG  12 hr tablet Take 1 tablet (15 mg total) by mouth every 12 (twelve) hours. 10/06/15  Yes [name], FNP  Multiple Vitamin (MULTIVITAMIN WITH MINERALS) TABS tablet Take 1 tablet by mouth daily.   Yes Historical Provider, MD  Oxycodone HCl 10 MG TABS Take 1 tablet (10 mg total) by mouth every 6 (six) hours as needed. Patient taking differently: Take 10 mg by mouth every 6 (six) hours as needed (breakthrough pain).  10/06/15  Yes [name], FNP  promethazine (PHENERGAN) 25 MG tablet Take 1 tablet (25 mg total) by mouth every 6 (six) hours as needed for nausea or vomiting. 12/07/15  Yes [name], MD  Vitamin D, Ergocalciferol, (DRISDOL) 50000 UNITS CAPS capsule Take 1 capsule (50,000 Units total) by mouth every 7 (seven) days. Takes on Sundays. 08/16/15  Yes [name], FNP     Allergies:  Allergies  Allergen Reactions  . Desferal [Deferoxamine] Hives    Local reaction on arm only during infusion. Can take with benadryl   . Latex Other (See Comments)    REACTION: Pt experiences a burning sensation on contacted skin areas  . Lisinopril Other (See Comments) and Cough    REACTION: Sore/scratchy throat  . Tape Other (See Comments)    REACTION: Pt. Experiences a burning sensation on contacted skin areas    Social History:   reports that she has never smoked. She has quit using smokeless tobacco. She reports that she does not drink alcohol or use illicit drugs.  Family History: Family History  Problem Relation Age of Onset  . Sickle cell trait Mother   . Sickle cell trait Father   . Hypertension Father   . Diabetes Father   . Sickle cell trait Sister   . Cancer Paternal Aunt     Breast     Physical Exam: Filed Vitals:   12/09/15 1100 12/09/15 1200 12/09/15 1230 12/09/15 1300  BP: 127/86 126/77 132/85 135/80  Pulse: 89 86 86 89  Temp:      TempSrc:      Resp: 20 15 20 19   Height:      Weight:      SpO2: 88% 93% 92% 93%   Blood pressure 135/80, pulse 89,  temperature 98.6 F (37 C), temperature source Oral, resp. rate 19, height 5\' 3"  (1.6 m), weight 75.5 kg (166 lb 7.2 oz), SpO2 93 %.  GEN:  Pleasant  patient lying in the stretcher in no acute distress; cooperative with exam. PSYCH:  alert and oriented x4; does not appear anxious or depressed; affect is appropriate. HEENT: Mucous membranes pink and anicteric; PERRLA; EOM intact; no cervical lymphadenopathy nor thyromegaly or carotid bruit; no JVD; There were no stridor. Neck is very supple. Breasts:: Not examined CHEST WALL: No tenderness CHEST: Normal respiration, clear to auscultation bilaterally.  HEART: Regular rate and rhythm.  There are no murmur, rub, or gallops.   BACK: No kyphosis or scoliosis; no CVA tenderness ABDOMEN: soft and non-tender; no masses, no organomegaly, normal abdominal bowel sounds; no pannus; no intertriginous candida. There is no rebound and no distention. Rectal Exam: Not done EXTREMITIES: No bone or joint deformity; age-appropriate arthropathy of the hands and knees; no edema; no ulcerations.  There is no calf tenderness. Genitalia: not examined PULSES: 2+ and symmetric SKIN: Normal hydration no rash or ulceration CNS: Cranial nerves 2-12 grossly intact no focal lateralizing neurologic deficit.  Speech is fluent; uvula elevated with phonation, facial symmetry and tongue midline. DTR are normal bilaterally, cerebella exam is intact, barbinski is negative and strengths are equaled bilaterally.  No sensory loss.   Labs on Admission:  Basic Metabolic Panel:  Recent Labs Lab 12/07/15 0955 12/09/15 0750  NA 138 135  K 3.3* 3.6  CL 108 104  CO2 25 23  GLUCOSE 84 94  BUN 6 6  CREATININE 0.45 0.40*  CALCIUM 8.4* 8.8*   Liver Function Tests:  Recent Labs Lab 12/07/15 0955 12/09/15 0750  AST 190* 376*  ALT 64* 104*  ALKPHOS 122 129*  BILITOT 9.5* 10.9*  PROT 7.6 8.3*  ALBUMIN 3.0* 3.3*   No results for input(s): LIPASE, AMYLASE in the last 168  hours. CBC:  Recent Labs Lab 12/07/15 0955 12/09/15 0750  WBC 6.8 7.4  NEUTROABS 3.6 4.3  HGB 8.3* 9.5*  HCT 22.6* 26.1*  MCV 100.0 99.6  PLT 169 173    Radiological Exams on Admission: Dg Chest 2 View  12/09/2015  CLINICAL DATA:  Chest pain.  Short of breath. EXAM: CHEST  2 VIEW COMPARISON:  10/25/2015 FINDINGS: Normal heart size. Lungs clear. No pneumothorax. No pleural effusion. IMPRESSION: No active cardiopulmonary disease. Electronically Signed   By: Marybelle Killings M.D.   On: 12/09/2015 08:15   US Abdomen Limited  12/09/2015  CLINICAL DATA:  Elevated liver function tests. EXAM: US ABDOMEN LIMITED - RIGHT UPPER QUADRANT COMPARISON:  CT scan of August 05, 2013. FINDINGS: Gallbladder: Status post cholecystectomy. Common bile duct: Diameter: 6 mm which is within normal limits. Liver: No focal lesion identified. Hepatic parenchyma has slightly nodular contours and slightly heterogeneous parenchyma suggesting hepatic cirrhosis. IMPRESSION: Status post cholecystectomy.  Possible hepatic cirrhosis. Electronically Signed   By: Marijo Conception, M.D.   On: 12/09/2015 10:47    EKG: Independently reviewed.   Assessment/Plan Present on Admission:  . Sickle cell crisis ([place]) . Sickle cell disease with crisis ([place]) . Sickle cell anemia (HCC) . Chronic pain syndrome  PLAN:  This patient presents with sickle cell crisis and will be admitted for further treatment.  Will treat with IVF, IV pain medications as tolerated, and supplement Oxygen.  Patient is stable, full code, and will be admitted to general medical floor. Home meds will be continued.  Will admit patient to [place] you for allowing me to partake in the care of your nice patient.    Other plans as per orders. Code Status: FULL Haskel Khan, MD.FACP [facility] Pager [phone] 7pm to 7am.  12/09/2015, 1:39 PM

## 2015-12-09 NOTE — ED Provider Notes (Signed)
CSN: [id number]     Arrival date & time 12/09/15  0735 History   First MD Initiated Contact with Patient 12/09/15 0759     Chief Complaint  Patient presents with  . Sickle Cell Pain Crisis      HPI  Pt was seen at 0800. Per pt, c/o gradual onset and persistence of constant "sickle cell pain" for the past 4 to 5 days. Describes her sickle cell pain as in her arms, legs, back and mid-chest, and per her usual chronic pain pattern. States over the past 2 days she has developed cough and runny/stuffy nose. Denies fevers, no rash, no palpitations, no SOB, no abd pain, no N/V/D. The symptoms have been associated with no other complaints. The patient has a significant history of similar symptoms previously, recently being evaluated for this complaint and multiple prior evals for same; last ED eval was 2 days ago.      Past Medical History  Diagnosis Date  . Sickle cell disease ([place])   . 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 Blood transfusion   . 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 HCAP (healthcare-associated pneumonia) 05/19/2013  . Sickle cell anemia (HCC)   . 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 Vitamin B12 deficiency 07/07/2015   Past Surgical History  Procedure Laterality Date  . Cholecystectomy    .  left knee acl reconstruction    . Cesarean section      x 2  . Portacath placement    . Portacath placement Left 02/23/2013    Procedure: INSERTION PORT-A-CATH;  Surgeon: Donato Heinz, MD;  Location: [facility];  Service: General;  Laterality: Left;  . Port-a-cath removal Left 07/29/2013    Procedure: REMOVAL PORT-A-CATH;  Surgeon: Scherry Ran, MD;  Location: [facility];  Service: General;  Laterality: Left;  . Tee without cardioversion N/A 08/07/2013    Procedure: TRANSESOPHAGEAL ECHOCARDIOGRAM (TEE);  Surgeon: Arnoldo Lenis, MD;  Location[facility];  Service: Cardiology;  Laterality: N/A;   Family History  Problem Relation Age of Onset  . Sickle cell trait Mother    . Sickle cell trait Father   . Hypertension Father   . Diabetes Father   . Sickle cell trait Sister   . Cancer Paternal Aunt     Breast   Social History  Substance Use Topics  . Smoking status: Never Smoker   . Smokeless tobacco: Former Systems developer  . Alcohol Use: No   OB History    Gravida Para Term Preterm AB TAB SAB Ectopic Multiple Living   1 1  1      1      Review of Systems ROS: Statement: All systems negative except as marked or noted in the HPI; Constitutional: Negative for fever and chills. ; ; Eyes: Negative for eye pain, redness and discharge. ; ; ENMT: Negative for ear pain, hoarseness, sore throat. nasal congestion, sinus pressure and rhinorrhea. ; ; Cardiovascular: Negative for palpitations, diaphoresis, dyspnea and peripheral edema. ; ; Respiratory: +cough. Negative for wheezing and stridor. ; ; Gastrointestinal: Negative for nausea, vomiting, diarrhea, abdominal pain, blood in stool, hematemesis, jaundice and rectal bleeding. . ; ; Genitourinary: Negative for dysuria, flank pain and hematuria. ; ; Musculoskeletal: +back pain, extremities pain, CP. Negative for neck pain. Negative for swelling and trauma.; ; Skin: Negative for pruritus, rash, abrasions, blisters, bruising and skin lesion.; ; Neuro: Negative for headache, lightheadedness and neck stiffness. Negative for weakness, altered level of consciousness , altered  mental status, extremity weakness, paresthesias, involuntary movement, seizure and syncope.       Allergies  Desferal; Latex; Lisinopril; and Tape  Home Medications   Prior to Admission medications   Medication Sig Start Date End Date Taking? Authorizing Provider  acetaminophen (TYLENOL) 500 MG tablet Take 500 mg by mouth every 6 (six) hours as needed.   Yes Historical Provider, MD  folic acid (FOLVITE) 1 MG tablet Take 1 tablet (1 mg total) by mouth daily. 08/16/15  Yes [name], FNP  hydroxyurea (HYDREA) 500 MG capsule Take 2 capsules (1,000 mg  total) by mouth daily. May take with food to minimize GI side effects. 09/05/15  Yes [name], FNP  levonorgestrel (MIRENA) 20 MCG/24HR IUD 1 each by Intrauterine route once.   Yes Historical Provider, MD  loratadine (CLARITIN) 10 MG tablet Take 10 mg by mouth daily.   Yes Historical Provider, MD  morphine (MS CONTIN) 15 MG 12 hr tablet Take 1 tablet (15 mg total) by mouth every 12 (twelve) hours. 10/06/15  Yes [name], FNP  Multiple Vitamin (MULTIVITAMIN WITH MINERALS) TABS tablet Take 1 tablet by mouth daily.   Yes Historical Provider, MD  Oxycodone HCl 10 MG TABS Take 1 tablet (10 mg total) by mouth every 6 (six) hours as needed. Patient taking differently: Take 10 mg by mouth every 6 (six) hours as needed (breakthrough pain).  10/06/15  Yes [name], FNP  promethazine (PHENERGAN) 25 MG tablet Take 1 tablet (25 mg total) by mouth every 6 (six) hours as needed for nausea or vomiting. 12/07/15  Yes [name], MD  Vitamin D, Ergocalciferol, (DRISDOL) 50000 UNITS CAPS capsule Take 1 capsule (50,000 Units total) by mouth every 7 (seven) days. Takes on Sundays. 08/16/15  Yes [name], FNP   BP 144/97 mmHg  Pulse 104  Temp(Src) 98.6 F (37 C) (Oral)  Resp 20  Ht 5\' 3"  (1.6 m)  Wt 166 lb 7.2 oz (75.5 kg)  BMI 29.49 kg/m2  SpO2 92% Physical Exam  0805: Physical examination:  Nursing notes reviewed; Vital signs and O2 SAT reviewed;  Constitutional: Well developed, Well nourished, Well hydrated, Uncomfortable appearing; Head:  Normocephalic, atraumatic; Eyes: EOMI, PERRL, No scleral icterus; ENMT: TM's clear bilat. +edemetous nasal turbinates bilat with clear rhinorrhea.  Mouth and pharynx without lesions. No tonsillar exudates. No intra-oral edema. No submandibular or sublingual edema. No hoarse voice, no drooling, no stridor. No pain with manipulation of larynx. No trismus. Mouth and pharynx normal, Mucous membranes moist; Neck: Supple, Full range of motion, No  lymphadenopathy; Cardiovascular: Tachycardic rate and rhythm, No gallop; Respiratory: Breath sounds clear & equal bilaterally, No wheezes.  Speaking full sentences with ease, Normal respiratory effort/excursion; Chest: Nontender, Movement normal; Abdomen: Soft, Nontender, Nondistended, Normal bowel sounds; Genitourinary: No CVA tenderness; Extremities: Pulses normal, No tenderness, No edema, No calf edema or asymmetry.; Neuro: AA&Ox3, Major CN grossly intact.  Speech clear. No gross focal motor or sensory deficits in extremities.; Skin: Color normal, Warm, Dry.   ED Course  Procedures (including critical care time) Labs Review  Imaging Review  I have personally reviewed and evaluated these images and lab results as part of my medical decision-making.   EKG Interpretation   Date/Time:  Friday December 09 2015 09:33:03 EDT Ventricular Rate:  88 PR Interval:  162 QRS Duration: 100 QT Interval:  395 QTC Calculation: 478 R Axis:   67 Text Interpretation:  Sinus rhythm Borderline prolonged QT interval  Baseline  wander When compared with ECG of 08/25/2015 No significant change  was found Confirmed by [facility]  MD, Nunzio Cory [phone]) on 12/09/2015 9:43:34  AM      MDM  MDM Reviewed: previous chart, nursing note and vitals Reviewed previous: labs Interpretation: labs and x-ray     Results for orders placed or performed during the hospital encounter of 12/09/15  Comprehensive metabolic panel  Result Value Ref Range   Sodium 135 135 - 145 mmol/L   Potassium 3.6 3.5 - 5.1 mmol/L   Chloride 104 101 - 111 mmol/L   CO2 23 22 - 32 mmol/L   Glucose, Bld 94 65 - 99 mg/dL   BUN 6 6 - 20 mg/dL   Creatinine, Ser 0.40 (L) 0.44 - 1.00 mg/dL   Calcium 8.8 (L) 8.9 - 10.3 mg/dL   Total Protein 8.3 (H) 6.5 - 8.1 g/dL   Albumin 3.3 (L) 3.5 - 5.0 g/dL   AST 376 (H) 15 - 41 U/L   ALT 104 (H) 14 - 54 U/L   Alkaline Phosphatase 129 (H) 38 - 126 U/L   Total Bilirubin 10.9 (H) 0.3 - 1.2 mg/dL   GFR calc  non Af Amer >60 >60 mL/min   GFR calc Af Amer >60 >60 mL/min   Anion gap 8 5 - 15  CBC with Differential  Result Value Ref Range   WBC 7.4 4.0 - 10.5 K/uL   RBC 2.62 (L) 3.87 - 5.11 MIL/uL   Hemoglobin 9.5 (L) 12.0 - 15.0 g/dL   HCT 26.1 (L) 36.0 - 46.0 %   MCV 99.6 78.0 - 100.0 fL   MCH 36.3 (H) 26.0 - 34.0 pg   MCHC 36.4 (H) 30.0 - 36.0 g/dL   RDW 21.0 (H) 11.5 - 15.5 %   Platelets 173 150 - 400 K/uL   Neutrophils Relative % 58 %   Neutro Abs 4.3 1.7 - 7.7 K/uL   Lymphocytes Relative 25 %   Lymphs Abs 1.8 0.7 - 4.0 K/uL   Monocytes Relative 16 %   Monocytes Absolute 1.2 (H) 0.1 - 1.0 K/uL   Eosinophils Relative 1 %   Eosinophils Absolute 0.1 0.0 - 0.7 K/uL   Basophils Relative 0 %   Basophils Absolute 0.0 0.0 - 0.1 K/uL   RBC Morphology SICKLE CELLS   Reticulocytes  Result Value Ref Range   Retic Ct Pct >23.0 (H) 0.4 - 3.1 %   RBC. 2.62 (L) 3.87 - 5.11 MIL/uL   Retic Count, Manual NOT CALCULATED 19.0 - 186.0 K/uL  Troponin I  Result Value Ref Range   Troponin I <0.03 <0.031 ng/mL   Dg Chest 2 View 12/09/2015  CLINICAL DATA:  Chest pain.  Short of breath. EXAM: CHEST  2 VIEW COMPARISON:  10/25/2015 FINDINGS: Normal heart size. Lungs clear. No pneumothorax. No pleural effusion. IMPRESSION: No active cardiopulmonary disease. Electronically Signed   By: Marybelle Killings M.D.   On: 12/09/2015 08:15   US Abdomen Limited 12/09/2015  CLINICAL DATA:  Elevated liver function tests. EXAM: US ABDOMEN LIMITED - RIGHT UPPER QUADRANT COMPARISON:  CT scan of August 05, 2013. FINDINGS: Gallbladder: Status post cholecystectomy. Common bile duct: Diameter: 6 mm which is within normal limits. Liver: No focal lesion identified. Hepatic parenchyma has slightly nodular contours and slightly heterogeneous parenchyma suggesting hepatic cirrhosis. IMPRESSION: Status post cholecystectomy.  Possible hepatic cirrhosis. Electronically Signed   By: Marijo Conception, M.D.   On: 12/09/2015 10:47    1150:  Pt  continues to  c/o pain despite multiple doses of IV meds. Pt's 2nd ED visit in 2 days; will admit.  Dx and testing d/w pt.  Questions answered.  Verb understanding, agreeable to admit.   T/C to [facility] [place] Comment, case discussed, including:  HPI, pertinent PM/SHx, VS/PE, dx testing, ED course and treatment:  Agreeable to admit, requests to write temporary orders, obtain medical bed to team [facility].   Francine Graven, DO 12/10/15 2029

## 2015-12-09 NOTE — ED Notes (Addendum)
Pt has sickle cell disease, c/o chest and back pain currently. Also reports cough, low grade fever since yesterday.

## 2015-12-10 DIAGNOSIS — D5700 Hb-SS disease with crisis, unspecified: Secondary | ICD-10-CM | POA: Diagnosis present

## 2015-12-10 DIAGNOSIS — Z9049 Acquired absence of other specified parts of digestive tract: Secondary | ICD-10-CM | POA: Diagnosis not present

## 2015-12-10 DIAGNOSIS — Z8249 Family history of ischemic heart disease and other diseases of the circulatory system: Secondary | ICD-10-CM | POA: Diagnosis not present

## 2015-12-10 DIAGNOSIS — Z809 Family history of malignant neoplasm, unspecified: Secondary | ICD-10-CM | POA: Diagnosis not present

## 2015-12-10 DIAGNOSIS — G894 Chronic pain syndrome: Secondary | ICD-10-CM | POA: Diagnosis present

## 2015-12-10 DIAGNOSIS — Z833 Family history of diabetes mellitus: Secondary | ICD-10-CM | POA: Diagnosis not present

## 2015-12-10 LAB — BASIC METABOLIC PANEL
ANION GAP: 5 (ref 5–15)
BUN: <5 mg/dL — ABNORMAL LOW (ref 6–20)
CO2: 26 mmol/L (ref 22–32)
Calcium: 8.1 mg/dL — ABNORMAL LOW (ref 8.9–10.3)
Chloride: 106 mmol/L (ref 101–111)
Creatinine, Ser: 0.37 mg/dL — ABNORMAL LOW (ref 0.44–1.00)
Glucose, Bld: 122 mg/dL — ABNORMAL HIGH (ref 65–99)
POTASSIUM: 3.6 mmol/L (ref 3.5–5.1)
SODIUM: 137 mmol/L (ref 135–145)

## 2015-12-10 LAB — CBC
HEMATOCRIT: 20.3 % — AB (ref 36.0–46.0)
HEMOGLOBIN: 7.4 g/dL — AB (ref 12.0–15.0)
MCH: 36.5 pg — ABNORMAL HIGH (ref 26.0–34.0)
MCHC: 36.5 g/dL — ABNORMAL HIGH (ref 30.0–36.0)
MCV: 100 fL (ref 78.0–100.0)
Platelets: 173 10*3/uL (ref 150–400)
RBC: 2.03 MIL/uL — AB (ref 3.87–5.11)
RDW: 21.3 % — AB (ref 11.5–15.5)
WBC: 9.2 10*3/uL (ref 4.0–10.5)

## 2015-12-10 MED ORDER — HYDROMORPHONE HCL 1 MG/ML IJ SOLN
1.5000 mg | INTRAMUSCULAR | Status: DC | PRN
  Administered 2015-12-10 (×2): 1.5 mg via INTRAVENOUS
  Administered 2015-12-10: 0.5 mg via INTRAVENOUS
  Administered 2015-12-10 (×2): 1.5 mg via INTRAVENOUS
  Filled 2015-12-10 (×4): qty 2

## 2015-12-10 MED ORDER — ONDANSETRON HCL 4 MG/2ML IJ SOLN
4.0000 mg | Freq: Four times a day (QID) | INTRAMUSCULAR | Status: DC | PRN
  Administered 2015-12-11: 4 mg via INTRAVENOUS
  Filled 2015-12-10: qty 2

## 2015-12-10 MED ORDER — CYCLOBENZAPRINE HCL 10 MG PO TABS
10.0000 mg | ORAL_TABLET | Freq: Three times a day (TID) | ORAL | Status: DC | PRN
  Administered 2015-12-10 – 2015-12-14 (×11): 10 mg via ORAL
  Filled 2015-12-10 (×11): qty 1

## 2015-12-10 MED ORDER — HYDROMORPHONE HCL 1 MG/ML IJ SOLN
2.0000 mg | INTRAMUSCULAR | Status: DC | PRN
  Administered 2015-12-10 – 2015-12-14 (×33): 2 mg via INTRAVENOUS
  Filled 2015-12-10 (×34): qty 2

## 2015-12-10 MED ORDER — HYDROMORPHONE HCL 1 MG/ML IJ SOLN
INTRAMUSCULAR | Status: AC
  Filled 2015-12-10: qty 1

## 2015-12-10 NOTE — Progress Notes (Signed)
[facility] PROGRESS NOTE  Dawn [name] [id number] DOB: 1981/04/09    PCP:   Angelica Chessman, MD   HPI: Dawn [name] is an 35 y.o. female whom I had admitted previously, admitted for acute sickle cell crisis with Recticulocyte percent of 23 percent.  She is still having pain and cramps.    Rewiew of Systems:  Constitutional: Negative for malaise, fever and chills. No significant weight loss or weight gain Eyes: Negative for eye pain, redness and discharge, diplopia, visual changes, or flashes of light. ENMT: Negative for ear pain, hoarseness, nasal congestion, sinus pressure and sore throat. No headaches; tinnitus, drooling, or problem swallowing. Cardiovascular: Negative for chest pain, palpitations, diaphoresis, dyspnea and peripheral edema. ; No orthopnea, PND Respiratory: Negative for cough, hemoptysis, wheezing and stridor. No pleuritic chestpain. Gastrointestinal: Negative for nausea, vomiting, diarrhea, constipation, abdominal pain, melena, blood in stool, hematemesis, jaundice and rectal bleeding.    Genitourinary: Negative for frequency, dysuria, incontinence,flank pain and hematuria; Musculoskeletal: Negative for back pain and neck pain. Negative for swelling and trauma.;  Skin: . Negative for pruritus, rash, abrasions, bruising and skin lesion.; ulcerations Neuro: Negative for headache, lightheadedness and neck stiffness. Negative for weakness, altered level of consciousness , altered mental status, extremity weakness, burning feet, involuntary movement, seizure and syncope.  Psych: negative for anxiety, depression, insomnia, tearfulness, panic attacks, hallucinations, paranoia, suicidal or homicidal ideation    Past Medical History  Diagnosis Date  . Sickle cell disease ([place])   . 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 Blood transfusion   . 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 HCAP (healthcare-associated pneumonia) 05/19/2013  . Sickle cell anemia (HCC)   .  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 Vitamin B12 deficiency 07/07/2015    Past Surgical History  Procedure Laterality Date  . Cholecystectomy    .  left knee acl reconstruction    . Cesarean section      x 2  . Portacath placement    . Portacath placement Left 02/23/2013    Procedure: INSERTION PORT-A-CATH;  Surgeon: Donato Heinz, MD;  Location: [facility];  Service: General;  Laterality: Left;  . Port-a-cath removal Left 07/29/2013    Procedure: REMOVAL PORT-A-CATH;  Surgeon: Scherry Ran, MD;  Location: [facility];  Service: General;  Laterality: Left;  . Tee without cardioversion N/A 08/07/2013    Procedure: TRANSESOPHAGEAL ECHOCARDIOGRAM (TEE);  Surgeon: Arnoldo Lenis, MD;  Location: [facility];  Service: Cardiology;  Laterality: N/A;    Medications:  HOME MEDS: Prior to Admission medications   Medication Sig Start Date End Date Taking? Authorizing Provider  acetaminophen (TYLENOL) 500 MG tablet Take 500 mg by mouth every 6 (six) hours as needed.   Yes Historical Provider, MD  folic acid (FOLVITE) 1 MG tablet Take 1 tablet (1 mg total) by mouth daily. 08/16/15  Yes [name], FNP  hydroxyurea (HYDREA) 500 MG capsule Take 2 capsules (1,000 mg total) by mouth daily. May take with food to minimize GI side effects. 09/05/15  Yes [name], FNP  levonorgestrel (MIRENA) 20 MCG/24HR IUD 1 each by Intrauterine route once.   Yes Historical Provider, MD  loratadine (CLARITIN) 10 MG tablet Take 10 mg by mouth daily.   Yes Historical Provider, MD  morphine (MS CONTIN) 15 MG 12 hr tablet Take 1 tablet (15 mg total) by mouth every 12 (twelve) hours. 10/06/15  Yes [name], FNP  Multiple Vitamin (MULTIVITAMIN WITH MINERALS) TABS tablet Take  1 tablet by mouth daily.   Yes Historical Provider, MD  Oxycodone HCl 10 MG TABS Take 1 tablet (10 mg total) by mouth every 6 (six) hours as needed. Patient taking differently: Take 10 mg by mouth every 6 (six) hours as needed  (breakthrough pain).  10/06/15  Yes [name], FNP  promethazine (PHENERGAN) 25 MG tablet Take 1 tablet (25 mg total) by mouth every 6 (six) hours as needed for nausea or vomiting. 12/07/15  Yes [name], MD  Vitamin D, Ergocalciferol, (DRISDOL) 50000 UNITS CAPS capsule Take 1 capsule (50,000 Units total) by mouth every 7 (seven) days. Takes on Sundays. 08/16/15  Yes [name], FNP     Allergies:  Allergies  Allergen Reactions  . Desferal [Deferoxamine] Hives    Local reaction on arm only during infusion. Can take with benadryl   . Latex Other (See Comments)    REACTION: Pt experiences a burning sensation on contacted skin areas  . Lisinopril Other (See Comments) and Cough    REACTION: Sore/scratchy throat  . Tape Other (See Comments)    REACTION: Pt. Experiences a burning sensation on contacted skin areas    Social History:   reports that she has never smoked. She has quit using smokeless tobacco. She reports that she does not drink alcohol or use illicit drugs.  Family History: Family History  Problem Relation Age of Onset  . Sickle cell trait Mother   . Sickle cell trait Father   . Hypertension Father   . Diabetes Father   . Sickle cell trait Sister   . Cancer Paternal Aunt     Breast     Physical Exam: Filed Vitals:   12/09/15 1355 12/09/15 1951 12/09/15 2238 12/10/15 0546  BP: 142/75  129/72 137/70  Pulse: 82  102 102  Temp: 98.7 F (37.1 C)  98.9 F (37.2 C) 98.9 F (37.2 C)  TempSrc: Oral  Oral Oral  Resp: 18  20 20   Height: 5\' 3"  (1.6 m)     Weight: 75.56 kg (166 lb 9.3 oz)     SpO2: 99% 93% 96% 92%   Blood pressure 137/70, pulse 102, temperature 98.9 F (37.2 C), temperature source Oral, resp. rate 20, height 5\' 3"  (1.6 m), weight 75.56 kg (166 lb 9.3 oz), SpO2 92 %.  GEN:  Pleasant  patient lying in the stretcher in no acute distress; cooperative with exam. PSYCH:  alert and oriented x4; does not appear anxious or depressed; affect is  appropriate. HEENT: Mucous membranes pink and anicteric; PERRLA; EOM intact; no cervical lymphadenopathy nor thyromegaly or carotid bruit; no JVD; There were no stridor. Neck is very supple. Breasts:: Not examined CHEST WALL: No tenderness CHEST: Normal respiration, clear to auscultation bilaterally.  HEART: Regular rate and rhythm.  There are no murmur, rub, or gallops.   BACK: No kyphosis or scoliosis; no CVA tenderness ABDOMEN: soft and non-tender; no masses, no organomegaly, normal abdominal bowel sounds; no pannus; no intertriginous candida. There is no rebound and no distention. Rectal Exam: Not done EXTREMITIES: No bone or joint deformity; age-appropriate arthropathy of the hands and knees; no edema; no ulcerations.  There is no calf tenderness. Genitalia: not examined PULSES: 2+ and symmetric SKIN: Normal hydration no rash or ulceration CNS: Cranial nerves 2-12 grossly intact no focal lateralizing neurologic deficit.  Speech is fluent; uvula elevated with phonation, facial symmetry and tongue midline. DTR are normal bilaterally, cerebella exam is intact, barbinski is negative and strengths are equaled  bilaterally.  No sensory loss.   Labs on Admission:  Basic Metabolic Panel:  Recent Labs Lab 12/07/15 0955 12/09/15 0750 12/10/15 0555  NA 138 135 137  K 3.3* 3.6 3.6  CL 108 104 106  CO2 25 23 26   GLUCOSE 84 94 122*  BUN 6 6 <5*  CREATININE 0.45 0.40* 0.37*  CALCIUM 8.4* 8.8* 8.1*   Liver Function Tests:  Recent Labs Lab 12/07/15 0955 12/09/15 0750  AST 190* 376*  ALT 64* 104*  ALKPHOS 122 129*  BILITOT 9.5* 10.9*  PROT 7.6 8.3*  ALBUMIN 3.0* 3.3*     Recent Labs Lab 12/07/15 0955 12/09/15 0750 12/10/15 0555  WBC 6.8 7.4 9.2  NEUTROABS 3.6 4.3  --   HGB 8.3* 9.5* 7.4*  HCT 22.6* 26.1* 20.3*  MCV 100.0 99.6 100.0  PLT 169 173 173   Cardiac Enzymes:  Recent Labs Lab 12/09/15 0750  TROPONINI <0.03    Radiological Exams on Admission: Dg Chest 2  View  12/09/2015  CLINICAL DATA:  Chest pain.  Short of breath. EXAM: CHEST  2 VIEW COMPARISON:  10/25/2015 FINDINGS: Normal heart size. Lungs clear. No pneumothorax. No pleural effusion. IMPRESSION: No active cardiopulmonary disease. Electronically Signed   By: Marybelle Killings M.D.   On: 12/09/2015 08:15   US Abdomen Limited  12/09/2015  CLINICAL DATA:  Elevated liver function tests. EXAM: US ABDOMEN LIMITED - RIGHT UPPER QUADRANT COMPARISON:  CT scan of August 05, 2013. FINDINGS: Gallbladder: Status post cholecystectomy. Common bile duct: Diameter: 6 mm which is within normal limits. Liver: No focal lesion identified. Hepatic parenchyma has slightly nodular contours and slightly heterogeneous parenchyma suggesting hepatic cirrhosis. IMPRESSION: Status post cholecystectomy.  Possible hepatic cirrhosis. Electronically Signed   By: Marijo Conception, M.D.   On: 12/09/2015 10:47   Assessment/Plan Present on Admission:  . Sickle cell crisis ([place]) . Sickle cell disease with crisis ([place]) . Sickle cell anemia (H[facility]) . Chronic pain syndrome  PLAN:  Sickle cell crisis:  Will increase Dilaudid to 2mg  IV Q 2 hours PRN.  Add flexeril for spasm.  Continue with current therapy.   Other plans as per orders. Code Status: FULL Haskel Khan, MD.  FACP [facility] Pager [phone] 7pm to 7am.  12/10/2015, 12:21 PM

## 2015-12-11 NOTE — Progress Notes (Signed)
[facility] PROGRESS NOTE  Dawn [name] [id number] DOB: Mar 14, 1981    PCP:   Dawn Chessman, MD   HPI:  Dawn [name] is an 35 y.o. female whom I had admitted previously, admitted for acute sickle cell crisis with Recticulocyte percent of 23 percent. She is still having pain and cramps. Pain control is adequate, but she still has some residual pain.   Rewiew of Systems:  Constitutional: Negative for malaise, fever and chills. No significant weight loss or weight gain Eyes: Negative for eye pain, redness and discharge, diplopia, visual changes, or flashes of light. ENMT: Negative for ear pain, hoarseness, nasal congestion, sinus pressure and sore throat. No headaches; tinnitus, drooling, or problem swallowing. Cardiovascular: Negative for chest pain, palpitations, diaphoresis, dyspnea and peripheral edema. ; No orthopnea, PND Respiratory: Negative for cough, hemoptysis, wheezing and stridor. No pleuritic chestpain. Gastrointestinal: Negative for nausea, vomiting, diarrhea, constipation, abdominal pain, melena, blood in stool, hematemesis, jaundice and rectal bleeding.    Genitourinary: Negative for frequency, dysuria, incontinence,flank pain and hematuria; Musculoskeletal: Negative for back pain and neck pain. Negative for swelling and trauma.;  Skin: . Negative for pruritus, rash, abrasions, bruising and skin lesion.; ulcerations Neuro: Negative for headache, lightheadedness and neck stiffness. Negative for weakness, altered level of consciousness , altered mental status, extremity weakness, burning feet, involuntary movement, seizure and syncope.  Psych: negative for anxiety, depression, insomnia, tearfulness, panic attacks, hallucinations, paranoia, suicidal or homicidal ideation    Past Medical History  Diagnosis Date  . Sickle cell disease ([place])   . 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 Blood transfusion   . 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 HCAP  (healthcare-associated pneumonia) 05/19/2013  . Sickle cell anemia ([facility])   . 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 Vitamin B12 deficiency 07/07/2015    Past Surgical History  Procedure Laterality Date  . Cholecystectomy    .  left knee acl reconstruction    . Cesarean section      x 2  . Portacath placement    . Portacath placement Left 02/23/2013    Procedure: INSERTION PORT-A-CATH;  Surgeon: Donato Heinz, MD;  Location: [facility];  Service: General;  Laterality: Left;  . Port-a-cath removal Left 07/29/2013    Procedure: REMOVAL PORT-A-CATH;  Surgeon: Scherry Ran, MD;  Location: [facility];  Service: General;  Laterality: Left;  . Tee without cardioversion N/A 08/07/2013    Procedure: TRANSESOPHAGEAL ECHOCARDIOGRAM (TEE);  Surgeon: Arnoldo Lenis, MD;  [facility];  Service: Cardiology;  Laterality: N/A;    Medications:  HOME MEDS: Prior to Admission medications   Medication Sig Start Date End Date Taking? Authorizing Provider  acetaminophen (TYLENOL) 500 MG tablet Take 500 mg by mouth every 6 (six) hours as needed.   Yes Historical Provider, MD  folic acid (FOLVITE) 1 MG tablet Take 1 tablet (1 mg total) by mouth daily. 08/16/15  Yes [name], FNP  hydroxyurea (HYDREA) 500 MG capsule Take 2 capsules (1,000 mg total) by mouth daily. May take with food to minimize GI side effects. 09/05/15  Yes [name], FNP  levonorgestrel (MIRENA) 20 MCG/24HR IUD 1 each by Intrauterine route once.   Yes Historical Provider, MD  loratadine (CLARITIN) 10 MG tablet Take 10 mg by mouth daily.   Yes Historical Provider, MD  morphine (MS CONTIN) 15 MG 12 hr tablet Take 1 tablet (15 mg total) by mouth every 12 (twelve) hours. 10/06/15  Yes [name],  FNP  Multiple Vitamin (MULTIVITAMIN WITH MINERALS) TABS tablet Take 1 tablet by mouth daily.   Yes Historical Provider, MD  Oxycodone HCl 10 MG TABS Take 1 tablet (10 mg total) by mouth every 6 (six) hours as  needed. Patient taking differently: Take 10 mg by mouth every 6 (six) hours as needed (breakthrough pain).  10/06/15  Yes [name], FNP  promethazine (PHENERGAN) 25 MG tablet Take 1 tablet (25 mg total) by mouth every 6 (six) hours as needed for nausea or vomiting. 12/07/15  Yes [name], MD  Vitamin D, Ergocalciferol, (DRISDOL) 50000 UNITS CAPS capsule Take 1 capsule (50,000 Units total) by mouth every 7 (seven) days. Takes on Sundays. 08/16/15  Yes [name], FNP     Allergies:  Allergies  Allergen Reactions  . Desferal [Deferoxamine] Hives    Local reaction on arm only during infusion. Can take with benadryl   . Latex Other (See Comments)    REACTION: Pt experiences a burning sensation on contacted skin areas  . Lisinopril Other (See Comments) and Cough    REACTION: Sore/scratchy throat  . Tape Other (See Comments)    REACTION: Pt. Experiences a burning sensation on contacted skin areas    Social History:   reports that she has never smoked. She has quit using smokeless tobacco. She reports that she does not drink alcohol or use illicit drugs.  Family History: Family History  Problem Relation Age of Onset  . Sickle cell trait Mother   . Sickle cell trait Father   . Hypertension Father   . Diabetes Father   . Sickle cell trait Sister   . Cancer Paternal Aunt     Breast     Physical Exam: Filed Vitals:   12/10/15 0546 12/10/15 1441 12/10/15 2109 12/11/15 0542  BP: 137/70 115/56 121/68 133/70  Pulse: 102 114 113 112  Temp: 98.9 F (37.2 C) 99.5 F (37.5 C) 99.6 F (37.6 C) 99 F (37.2 C)  TempSrc: Oral Oral Oral Oral  Resp: 20 20 20 20   Height:      Weight:      SpO2: 92% 92% 91% 92%   Blood pressure 133/70, pulse 112, temperature 99 F (37.2 C), temperature source Oral, resp. rate 20, height 5\' 3"  (1.6 m), weight 75.56 kg (166 lb 9.3 oz), SpO2 92 %.  GEN:  Pleasant  patient lying in the stretcher in no acute distress; cooperative with  exam. PSYCH:  alert and oriented x4; does not appear anxious or depressed; affect is appropriate. HEENT: Mucous membranes pink and anicteric; PERRLA; EOM intact; no cervical lymphadenopathy nor thyromegaly or carotid bruit; no JVD; There were no stridor. Neck is very supple. Breasts:: Not examined CHEST WALL: No tenderness CHEST: Normal respiration, clear to auscultation bilaterally.  HEART: Regular rate and rhythm.  There are no murmur, rub, or gallops.   BACK: No kyphosis or scoliosis; no CVA tenderness ABDOMEN: soft and non-tender; no masses, no organomegaly, normal abdominal bowel sounds; no pannus; no intertriginous candida. There is no rebound and no distention. Rectal Exam: Not done EXTREMITIES: No bone or joint deformity; age-appropriate arthropathy of the hands and knees; no edema; no ulcerations.  There is no calf tenderness. Genitalia: not examined PULSES: 2+ and symmetric SKIN: Normal hydration no rash or ulceration CNS: Cranial nerves 2-12 grossly intact no focal lateralizing neurologic deficit.  Speech is fluent; uvula elevated with phonation, facial symmetry and tongue midline. DTR are normal bilaterally, cerebella exam is intact, barbinski is  negative and strengths are equaled bilaterally.  No sensory loss.   Labs on Admission:  Basic Metabolic Panel:  Recent Labs Lab 12/07/15 0955 12/09/15 0750 12/10/15 0555  NA 138 135 137  K 3.3* 3.6 3.6  CL 108 104 106  CO2 25 23 26   GLUCOSE 84 94 122*  BUN 6 6 <5*  CREATININE 0.45 0.40* 0.37*  CALCIUM 8.4* 8.8* 8.1*   Liver Function Tests:  Recent Labs Lab 12/07/15 0955 12/09/15 0750  AST 190* 376*  ALT 64* 104*  ALKPHOS 122 129*  BILITOT 9.5* 10.9*  PROT 7.6 8.3*  ALBUMIN 3.0* 3.3*   CBC:  Recent Labs Lab 12/07/15 0955 12/09/15 0750 12/10/15 0555  WBC 6.8 7.4 9.2  NEUTROABS 3.6 4.3  --   HGB 8.3* 9.5* 7.4*  HCT 22.6* 26.1* 20.3*  MCV 100.0 99.6 100.0  PLT 169 173 173   Cardiac Enzymes:  Recent  Labs Lab 12/09/15 0750  TROPONINI <0.03   Assessment/Plan Present on Admission:  . Sickle cell crisis ([place]) . Sickle cell disease with crisis ([place]) . Sickle cell anemia ([facility]) . Chronic pain syndrome  PLAN:  Sickle cell crisis:  WIll continue with current therapy.   IVF, pain meds, etc. Anemia:  Doesn't require transfusion.  WIll follow up with H and H tomorrow.   Other plans as per orders. Code Status:FULL CODE.    [name], MD.  FACP [facility] Pager [phone] 7pm to 7am.  12/11/2015, 11:52 AM

## 2015-12-12 LAB — CBC
HEMATOCRIT: 19.6 % — AB (ref 36.0–46.0)
Hemoglobin: 7.2 g/dL — ABNORMAL LOW (ref 12.0–15.0)
MCH: 36.4 pg — ABNORMAL HIGH (ref 26.0–34.0)
MCHC: 36.7 g/dL — AB (ref 30.0–36.0)
MCV: 99 fL (ref 78.0–100.0)
Platelets: 162 10*3/uL (ref 150–400)
RBC: 1.98 MIL/uL — ABNORMAL LOW (ref 3.87–5.11)
RDW: 22.8 % — AB (ref 11.5–15.5)
WBC: 10.5 10*3/uL (ref 4.0–10.5)

## 2015-12-12 MED ORDER — HYDROXYZINE HCL 10 MG PO TABS
10.0000 mg | ORAL_TABLET | Freq: Three times a day (TID) | ORAL | Status: DC | PRN
  Administered 2015-12-12 – 2015-12-14 (×6): 10 mg via ORAL
  Filled 2015-12-12 (×6): qty 1

## 2015-12-12 NOTE — Progress Notes (Signed)
[facility] PROGRESS NOTE  NAYDELYN KYER [id number] DOB: 29-Oct-1980    PCP:   Angelica Chessman, MD   HPI:  Dawn [name] is an 35 y.o. female whom I had admitted previously, admitted for acute sickle cell crisis with Recticulocyte percent of 23 percent. She is still having pain and cramps. Pain control is adequate, but she still has some residual pain. No new changes.    Rewiew of Systems:  Constitutional: Negative for malaise, fever and chills. No significant weight loss or weight gain Eyes: Negative for eye pain, redness and discharge, diplopia, visual changes, or flashes of light. ENMT: Negative for ear pain, hoarseness, nasal congestion, sinus pressure and sore throat. No headaches; tinnitus, drooling, or problem swallowing. Cardiovascular: Negative for chest pain, palpitations, diaphoresis, dyspnea and peripheral edema. ; No orthopnea, PND Respiratory: Negative for cough, hemoptysis, wheezing and stridor. No pleuritic chestpain. Gastrointestinal: Negative for nausea, vomiting, diarrhea, constipation, abdominal pain, melena, blood in stool, hematemesis, jaundice and rectal bleeding.    Genitourinary: Negative for frequency, dysuria, incontinence,flank pain and hematuria; Musculoskeletal: Negative for back pain and neck pain. Negative for swelling and trauma.;  Skin: . Negative for pruritus, rash, abrasions, bruising and skin lesion.; ulcerations Neuro: Negative for headache, lightheadedness and neck stiffness. Negative for weakness, altered level of consciousness , altered mental status, extremity weakness, burning feet, involuntary movement, seizure and syncope.  Psych: negative for anxiety, depression, insomnia, tearfulness, panic attacks, hallucinations, paranoia, suicidal or homicidal ideation   Past Medical History  Diagnosis Date  . Sickle cell disease ([place])   . 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 Blood transfusion   . 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  HCAP (healthcare-associated pneumonia) 05/19/2013  . Sickle cell anemia (HCC)   . 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 Vitamin B12 deficiency 07/07/2015    Past Surgical History  Procedure Laterality Date  . Cholecystectomy    .  left knee acl reconstruction    . Cesarean section      x 2  . Portacath placement    . Portacath placement Left 02/23/2013    Procedure: INSERTION PORT-A-CATH;  Surgeon: Donato Heinz, MD;  Location: [facility];  Service: General;  Laterality: Left;  . Port-a-cath removal Left 07/29/2013    Procedure: REMOVAL PORT-A-CATH;  Surgeon: Scherry Ran, MD;  Location: [facility];  Service: General;  Laterality: Left;  . Tee without cardioversion N/A 08/07/2013    Procedure: TRANSESOPHAGEAL ECHOCARDIOGRAM (TEE);  Surgeon: Arnoldo Lenis, MD;  Location: [facility];  Service: Cardiology;  Laterality: N/A;    Medications:  HOME MEDS: Prior to Admission medications   Medication Sig Start Date End Date Taking? Authorizing Provider  acetaminophen (TYLENOL) 500 MG tablet Take 500 mg by mouth every 6 (six) hours as needed.   Yes Historical Provider, MD  folic acid (FOLVITE) 1 MG tablet Take 1 tablet (1 mg total) by mouth daily. 08/16/15  Yes [name], FNP  hydroxyurea (HYDREA) 500 MG capsule Take 2 capsules (1,000 mg total) by mouth daily. May take with food to minimize GI side effects. 09/05/15  Yes [name], FNP  levonorgestrel (MIRENA) 20 MCG/24HR IUD 1 each by Intrauterine route once.   Yes Historical Provider, MD  loratadine (CLARITIN) 10 MG tablet Take 10 mg by mouth daily.   Yes Historical Provider, MD  morphine (MS CONTIN) 15 MG 12 hr tablet Take 1 tablet (15 mg total) by mouth every 12 (twelve) hours. 10/06/15  Yes  [name], FNP  Multiple Vitamin (MULTIVITAMIN WITH MINERALS) TABS tablet Take 1 tablet by mouth daily.   Yes Historical Provider, MD  Oxycodone HCl 10 MG TABS Take 1 tablet (10 mg total) by mouth every 6 (six) hours as  needed. Patient taking differently: Take 10 mg by mouth every 6 (six) hours as needed (breakthrough pain).  10/06/15  Yes [name], FNP  promethazine (PHENERGAN) 25 MG tablet Take 1 tablet (25 mg total) by mouth every 6 (six) hours as needed for nausea or vomiting. 12/07/15  Yes [name], MD  Vitamin D, Ergocalciferol, (DRISDOL) 50000 UNITS CAPS capsule Take 1 capsule (50,000 Units total) by mouth every 7 (seven) days. Takes on Sundays. 08/16/15  Yes [name], FNP     Allergies:  Allergies  Allergen Reactions  . Desferal [Deferoxamine] Hives    Local reaction on arm only during infusion. Can take with benadryl   . Latex Other (See Comments)    REACTION: Pt experiences a burning sensation on contacted skin areas  . Lisinopril Other (See Comments) and Cough    REACTION: Sore/scratchy throat  . Tape Other (See Comments)    REACTION: Pt. Experiences a burning sensation on contacted skin areas    Social History:   reports that she has never smoked. She has quit using smokeless tobacco. She reports that she does not drink alcohol or use illicit drugs.  Family History: Family History  Problem Relation Age of Onset  . Sickle cell trait Mother   . Sickle cell trait Father   . Hypertension Father   . Diabetes Father   . Sickle cell trait Sister   . Cancer Paternal Aunt     Breast     Physical Exam: Filed Vitals:   12/11/15 0542 12/11/15 1431 12/11/15 2122 12/12/15 0436  BP: 133/70 137/70 133/82 131/75  Pulse: 112 114 116 113  Temp: 99 F (37.2 C) 98.8 F (37.1 C) 100 F (37.8 C) 99.5 F (37.5 C)  TempSrc: Oral Oral Oral Oral  Resp: 20 20 20 20   Height:      Weight:      SpO2: 92% 93% 94% 91%   Blood pressure 131/75, pulse 113, temperature 99.5 F (37.5 C), temperature source Oral, resp. rate 20, height 5\' 3"  (1.6 m), weight 75.56 kg (166 lb 9.3 oz), SpO2 91 %.  GEN:  Pleasant patient lying in the stretcher in no acute distress; cooperative with  exam. PSYCH:  alert and oriented x4; does not appear anxious or depressed; affect is appropriate. HEENT: Mucous membranes pink and anicteric; PERRLA; EOM intact; no cervical lymphadenopathy nor thyromegaly or carotid bruit; no JVD; There were no stridor. Neck is very supple. Breasts:: Not examined CHEST WALL: No tenderness CHEST: Normal respiration, clear to auscultation bilaterally.  HEART: Regular rate and rhythm.  There are no murmur, rub, or gallops.   BACK: No kyphosis or scoliosis; no CVA tenderness ABDOMEN: soft and non-tender; no masses, no organomegaly, normal abdominal bowel sounds; no pannus; no intertriginous candida. There is no rebound and no distention. Rectal Exam: Not done EXTREMITIES: No bone or joint deformity; age-appropriate arthropathy of the hands and knees; no edema; no ulcerations.  There is no calf tenderness. Genitalia: not examined PULSES: 2+ and symmetric SKIN: Normal hydration no rash or ulceration CNS: Cranial nerves 2-12 grossly intact no focal lateralizing neurologic deficit.  Speech is fluent; uvula elevated with phonation, facial symmetry and tongue midline. DTR are normal bilaterally, cerebella exam is intact,  barbinski is negative and strengths are equaled bilaterally.  No sensory loss.   Labs on Admission:  Basic Metabolic Panel:  Recent Labs Lab 12/07/15 0955 12/09/15 0750 12/10/15 0555  NA 138 135 137  K 3.3* 3.6 3.6  CL 108 104 106  CO2 25 23 26   GLUCOSE 84 94 122*  BUN 6 6 <5*  CREATININE 0.45 0.40* 0.37*  CALCIUM 8.4* 8.8* 8.1*   Liver Function Tests:  Recent Labs Lab 12/07/15 0955 12/09/15 0750  AST 190* 376*  ALT 64* 104*  ALKPHOS 122 129*  BILITOT 9.5* 10.9*  PROT 7.6 8.3*  ALBUMIN 3.0* 3.3*   CBC:  Recent Labs Lab 12/07/15 0955 12/09/15 0750 12/10/15 0555 12/12/15 0709  WBC 6.8 7.4 9.2 10.5  NEUTROABS 3.6 4.3  --   --   HGB 8.3* 9.5* 7.4* 7.2*  HCT 22.6* 26.1* 20.3* 19.6*  MCV 100.0 99.6 100.0 99.0  PLT 169 173  173 162   Cardiac Enzymes:  Recent Labs Lab 12/09/15 0750  TROPONINI <0.03   Assessment/Plan Present on Admission:  . Sickle cell crisis ([place]) . Sickle cell disease with crisis ([place]) . Sickle cell anemia (HCC) . Chronic pain syndrome  PLAN:  Sickle cell crisis: WIll continue with current therapy. IVF, pain meds, etc. Anemia: Doesn't require transfusion. WIll follow up with H and H tomorrow.   Other plans as per orders. Code Status:FULL CODE.    [name], MD.  FACP [facility] Pager [phone] 7pm to 7am.  12/12/2015, 12:52 PM

## 2015-12-12 NOTE — Progress Notes (Signed)
Pt has noted raised areas to both cheeks.  States that her face has been itching for the last day or so.  PO Benadryl per PRN order did not stop itching per patient.  Dr. Marin Comment paged and made aware of need for additional interventions.  Will continue to monitor.

## 2015-12-13 NOTE — Care Management Obs Status (Deleted)
[place] NOTIFICATION   Patient Details  Name: Dawn [name] MRN: [id number] Date of Birth: 12-Dec-1980   Medicare Observation Status Notification Given:  Yes    Sherald Barge, RN 12/13/2015, 3:42 PM

## 2015-12-13 NOTE — Progress Notes (Signed)
[facility] PROGRESS NOTE  Dawn [name] [id number] DOB: [birth year]    PCP:   Dawn Chessman, MD  .  HPI:  Dawn [name] is an 35 y.o. female whom I had admitted previously, admitted for acute sickle cell crisis with Recticulocyte percent of 23 percent. She is still having pain and cramps. Pain control is adequate, but she still has some residual pain. No new changes.  She would like to go home tomorrow.   Rewiew of Systems:  Constitutional: Negative for malaise, fever and chills. No significant weight loss or weight gain Eyes: Negative for eye pain, redness and discharge, diplopia, visual changes, or flashes of light. ENMT: Negative for ear pain, hoarseness, nasal congestion, sinus pressure and sore throat. No headaches; tinnitus, drooling, or problem swallowing. Cardiovascular: Negative for chest pain, palpitations, diaphoresis, dyspnea and peripheral edema. ; No orthopnea, PND Respiratory: Negative for cough, hemoptysis, wheezing and stridor. No pleuritic chestpain. Gastrointestinal: Negative for nausea, vomiting, diarrhea, constipation, abdominal pain, melena, blood in stool, hematemesis, jaundice and rectal bleeding.    Genitourinary: Negative for frequency, dysuria, incontinence,flank pain and hematuria; Musculoskeletal: Negative for back pain and neck pain. Negative for swelling and trauma.;  Skin: . Negative for pruritus, rash, abrasions, bruising and skin lesion.; ulcerations Neuro: Negative for headache, lightheadedness and neck stiffness. Negative for weakness, altered level of consciousness , altered mental status, extremity weakness, burning feet, involuntary movement, seizure and syncope.  Psych: negative for anxiety, depression, insomnia, tearfulness, panic attacks, hallucinations, paranoia, suicidal or homicidal ideation    Past Medical History  Diagnosis Date  . Sickle cell disease ([place])   . 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 Blood transfusion   . Reactive  depression (situational) 03/28/2012  . HCAP (healthcare-associated pneumonia) 05/19/2013  . Sickle cell anemia (HCC)   . 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 Vitamin B12 deficiency 07/07/2015    Past Surgical History  Procedure Laterality Date  . Cholecystectomy    .  left knee acl reconstruction    . Cesarean section      x 2  . Portacath placement    . Portacath placement Left 02/23/2013    Procedure: INSERTION PORT-A-CATH;  Surgeon: Donato Heinz, MD;  Location: [facility];  Service: General;  Laterality: Left;  . Port-a-cath removal Left 07/29/2013    Procedure: REMOVAL PORT-A-CATH;  Surgeon: Scherry Ran, MD;  Location: [facility];  Service: General;  Laterality: Left;  . Tee without cardioversion N/A 08/07/2013    Procedure: TRANSESOPHAGEAL ECHOCARDIOGRAM (TEE);  Surgeon: Arnoldo Lenis, MD;  Location[facility];  Service: Cardiology;  Laterality: N/A;    Medications:  HOME MEDS: Prior to Admission medications   Medication Sig Start Date End Date Taking? Authorizing Provider  acetaminophen (TYLENOL) 500 MG tablet Take 500 mg by mouth every 6 (six) hours as needed.   Yes Historical Provider, MD  folic acid (FOLVITE) 1 MG tablet Take 1 tablet (1 mg total) by mouth daily. 08/16/15  Yes [name], FNP  hydroxyurea (HYDREA) 500 MG capsule Take 2 capsules (1,000 mg total) by mouth daily. May take with food to minimize GI side effects. 09/05/15  Yes [name], FNP  levonorgestrel (MIRENA) 20 MCG/24HR IUD 1 each by Intrauterine route once.   Yes Historical Provider, MD  loratadine (CLARITIN) 10 MG tablet Take 10 mg by mouth daily.   Yes Historical Provider, MD  morphine (MS CONTIN) 15 MG 12 hr tablet Take 1 tablet (15 mg total)  by mouth every 12 (twelve) hours. 10/06/15  Yes [name], FNP  Multiple Vitamin (MULTIVITAMIN WITH MINERALS) TABS tablet Take 1 tablet by mouth daily.   Yes Historical Provider, MD  Oxycodone HCl 10 MG TABS Take 1 tablet (10 mg total) by  mouth every 6 (six) hours as needed. Patient taking differently: Take 10 mg by mouth every 6 (six) hours as needed (breakthrough pain).  10/06/15  Yes [name], FNP  promethazine (PHENERGAN) 25 MG tablet Take 1 tablet (25 mg total) by mouth every 6 (six) hours as needed for nausea or vomiting. 12/07/15  Yes [name], MD  Vitamin D, Ergocalciferol, (DRISDOL) 50000 UNITS CAPS capsule Take 1 capsule (50,000 Units total) by mouth every 7 (seven) days. Takes on Sundays. 08/16/15  Yes [name], FNP     Allergies:  Allergies  Allergen Reactions  . Desferal [Deferoxamine] Hives    Local reaction on arm only during infusion. Can take with benadryl   . Latex Other (See Comments)    REACTION: Pt experiences a burning sensation on contacted skin areas  . Lisinopril Other (See Comments) and Cough    REACTION: Sore/scratchy throat  . Tape Other (See Comments)    REACTION: Pt. Experiences a burning sensation on contacted skin areas    Social History:   reports that she has never smoked. She has quit using smokeless tobacco. She reports that she does not drink alcohol or use illicit drugs.  Family History: Family History  Problem Relation Age of Onset  . Sickle cell trait Mother   . Sickle cell trait Father   . Hypertension Father   . Diabetes Father   . Sickle cell trait Sister   . Cancer Paternal Aunt     Breast     Physical Exam: Filed Vitals:   12/12/15 1500 12/12/15 2100 12/13/15 0500 12/13/15 1300  BP: 120/61 139/73 117/67 141/79  Pulse: 118 72 113 108  Temp: 99.4 F (37.4 C) 99.5 F (37.5 C) 99.3 F (37.4 C) 99.1 F (37.3 C)  TempSrc: Oral Oral Oral Oral  Resp: 20 20 20 22   Height:      Weight:      SpO2: 92% 91% 91% 92%   Blood pressure 141/79, pulse 108, temperature 99.1 F (37.3 C), temperature source Oral, resp. rate 22, height 5\' 3"  (1.6 m), weight 75.56 kg (166 lb 9.3 oz), SpO2 92 %.  GEN:  Pleasant  patient lying in the stretcher in no acute  distress; cooperative with exam. PSYCH:  alert and oriented x4; does not appear anxious or depressed; affect is appropriate. HEENT: Mucous membranes pink and anicteric; PERRLA; EOM intact; no cervical lymphadenopathy nor thyromegaly or carotid bruit; no JVD; There were no stridor. Neck is very supple. Breasts:: Not examined CHEST WALL: No tenderness CHEST: Normal respiration, clear to auscultation bilaterally.  HEART: Regular rate and rhythm.  There are no murmur, rub, or gallops.   BACK: No kyphosis or scoliosis; no CVA tenderness ABDOMEN: soft and non-tender; no masses, no organomegaly, normal abdominal bowel sounds; no pannus; no intertriginous candida. There is no rebound and no distention. Rectal Exam: Not done EXTREMITIES: No bone or joint deformity; age-appropriate arthropathy of the hands and knees; no edema; no ulcerations.  There is no calf tenderness. Genitalia: not examined PULSES: 2+ and symmetric SKIN: Normal hydration no rash or ulceration CNS: Cranial nerves 2-12 grossly intact no focal lateralizing neurologic deficit.  Speech is fluent; uvula elevated with phonation, facial symmetry and  tongue midline. DTR are normal bilaterally, cerebella exam is intact, barbinski is negative and strengths are equaled bilaterally.  No sensory loss.   Labs on Admission:  Basic Metabolic Panel:  Recent Labs Lab 12/07/15 0955 12/09/15 0750 12/10/15 0555  NA 138 135 137  K 3.3* 3.6 3.6  CL 108 104 106  CO2 25 23 26   GLUCOSE 84 94 122*  BUN 6 6 <5*  CREATININE 0.45 0.40* 0.37*  CALCIUM 8.4* 8.8* 8.1*   Liver Function Tests:  Recent Labs Lab 12/07/15 0955 12/09/15 0750  AST 190* 376*  ALT 64* 104*  ALKPHOS 122 129*  BILITOT 9.5* 10.9*  PROT 7.6 8.3*  ALBUMIN 3.0* 3.3*   No results for input(s): LIPASE, AMYLASE in the last 168 hours. No results for input(s): AMMONIA in the last 168 hours. CBC:  Recent Labs Lab 12/07/15 0955 12/09/15 0750 12/10/15 0555 12/12/15 0709   WBC 6.8 7.4 9.2 10.5  NEUTROABS 3.6 4.3  --   --   HGB 8.3* 9.5* 7.4* 7.2*  HCT 22.6* 26.1* 20.3* 19.6*  MCV 100.0 99.6 100.0 99.0  PLT 169 173 173 162   Cardiac Enzymes:  Recent Labs Lab 12/09/15 0750  TROPONINI <0.03   Assessment/Plan Present on Admission:  . Sickle cell crisis ([place]) . Sickle cell disease with crisis ([place]) . Sickle cell anemia (HCC) . Chronic pain syndrome  PLAN:   Sickle cell crisis: WIll continue with current therapy. IVF, pain meds, etc. Anemia: Doesn't require transfusion. Hb stable at 7.2 g per dL.   Other plans as per orders. Code Status: FULL CODE>    [name], MD.  FACP [facility] Pager [phone] 7pm to 7am.  12/13/2015, 2:53 PM

## 2015-12-14 MED ORDER — HYDROXYZINE HCL 10 MG PO TABS: 10.0000 mg | ORAL_TABLET | Freq: Three times a day (TID) | ORAL | Status: DC | PRN

## 2015-12-14 MED ORDER — CYCLOBENZAPRINE HCL 10 MG PO TABS: 10.0000 mg | ORAL_TABLET | Freq: Three times a day (TID) | ORAL | Status: DC | PRN

## 2015-12-14 NOTE — Care Management Important Message (Signed)
Important Message  Patient Details  Name: NOUREEN STUMPP MRN: [id number] Date of Birth: 1981-06-12   Medicare Important Message Given:  Yes    Sherald Barge, RN 12/14/2015, 11:21 AM

## 2015-12-14 NOTE — Discharge Summary (Signed)
Physician Discharge Summary  Dawn [name] [id number] DOB: Nov 18, 1980 DOA: 12/09/2015  PCP: Angelica Chessman, MD  Admit date: 12/09/2015 Discharge date: 12/14/2015  Time spent: 35 minutes  Recommendations for Outpatient Follow-up:  1. Follow up with your PCP next week.    Discharge Diagnoses:  Active Problems:   Sickle cell anemia (HCC)   Sickle cell crisis (HCC)   Sickle cell disease with crisis ([place])   Chronic pain syndrome   Discharge Condition: improved.  Still has pain, but tolerable.  Hb low but stable at 7 g per dL.    Diet recommendation:  Regular.   Filed Weights   12/09/15 0743 12/09/15 0804 12/09/15 1355  Weight: 75.751 kg (167 lb) 75.5 kg (166 lb 7.2 oz) 75.56 kg (166 lb 9.3 oz)    History of present illness:  Patient was admitted by me for sickle cell crisis on December 09, 2015.  As per my prior H and P:  "Dawn [name] is an 35 y.o. female with hx of sickle cell disease but infrequent sickle cell crisis, hx of reactive depression, atrial enlargement, presents today to the ER with usual back and leg pain typical of sickle cell crisis. There has been no chest pain or shortness of breath, fever, or chills. Evaluation in the ER included a reticulocyte count of greater than 23 percent, Hb of 9.5 g per dL, with normal renal function test. In the ER, IVF and IV pain medication was given, but patient continue to have pain, and hospitalist was asked to admit for sickle cell crisis.   Hospital Course:  She was admitted for pain crisis without chest syndrome, and had elevation of her retic count to over 23 percent.  Her Hb was low but stable, and she did not require transfusion, remained at 7 grams per dL.  She was given IVF, Dilaudid up to 2 mg per 2 hours, and given flexeril and Vistaril for symptomatic Tx of her cramps.  Her Hydrea and Folate were both continued.   Her pain has improved, and though not resolved completely, she felt she can go home.  Will discharge her today, and  she can follow up with her PCP.  She has pain meds at home.  Will give Flexeril and Vistaryl in addition.  Thank you and Good Day.   Consultations:  None.   Discharge Exam: Filed Vitals:   12/13/15 2100 12/14/15 0423  BP: 119/66 120/63  Pulse: 109 107  Temp: 99.4 F (37.4 C) 99.3 F (37.4 C)  Resp: 20 20   Discharge Instructions   Discharge Instructions    Diet - low sodium heart healthy    Complete by:  As directed      Increase activity slowly    Complete by:  As directed           Current Discharge Medication List    START taking these medications   Details  cyclobenzaprine (FLEXERIL) 10 MG tablet Take 1 tablet (10 mg total) by mouth 3 (three) times daily as needed for muscle spasms. Qty: 30 tablet, Refills: 0    hydrOXYzine (ATARAX/VISTARIL) 10 MG tablet Take 1 tablet (10 mg total) by mouth 3 (three) times daily as needed for itching. Qty: 30 tablet, Refills: 0      CONTINUE these medications which have NOT CHANGED   Details  acetaminophen (TYLENOL) 500 MG tablet Take 500 mg by mouth every 6 (six) hours as needed.    folic acid (FOLVITE) 1 MG tablet Take 1  tablet (1 mg total) by mouth daily. 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 May take with food to minimize GI side effects. Qty: 60 capsule, Refills: 11   Associated Diagnoses: Hb-SS disease without crisis ([place])    levonorgestrel (MIRENA) 20 MCG/24HR IUD 1 each by Intrauterine route once.    loratadine (CLARITIN) 10 MG tablet Take 10 mg by mouth daily.    morphine (MS CONTIN) 15 MG 12 hr tablet Take 1 tablet (15 mg total) by mouth every 12 (twelve) hours. Qty: 60 tablet, Refills: 0   Associated Diagnoses: Hb-SS disease without crisis ([place])    Multiple Vitamin (MULTIVITAMIN WITH MINERALS) TABS tablet Take 1 tablet by mouth daily.    Oxycodone HCl 10 MG TABS Take 1  tablet (10 mg total) by mouth every 6 (six) hours as needed. Qty: 60 tablet, Refills: 0   Associated Diagnoses: Hb-SS disease without crisis (HCC)    promethazine (PHENERGAN) 25 MG tablet Take 1 tablet (25 mg total) by mouth every 6 (six) hours as needed for nausea or vomiting. Qty: 20 tablet, Refills: 0    Vitamin D, Ergocalciferol, (DRISDOL) 50000 UNITS CAPS capsule Take 1 capsule (50,000 Units total) by mouth every 7 (seven) days. Takes on Sundays. Qty: 4 capsule, Refills: 11   Associated Diagnoses: Vitamin D deficiency       Allergies  Allergen Reactions  . Desferal [Deferoxamine] Hives    Local reaction on arm only during infusion. Can take with benadryl   . Latex Other (See Comments)    REACTION: Pt experiences a burning sensation on contacted skin areas  . Lisinopril Other (See Comments) and Cough    REACTION: Sore/scratchy throat  . Tape Other (See Comments)    REACTION: Pt. Experiences a burning sensation on contacted skin areas      The results of significant diagnostics from this hospitalization (including imaging, microbiology, ancillary and laboratory) are listed below for reference.    Significant Diagnostic Studies: Dg Chest 2 View  12/09/2015  CLINICAL DATA:  Chest pain.  Short of breath. EXAM: CHEST  2 VIEW COMPARISON:  10/25/2015 FINDINGS: Normal heart size. Lungs clear. No pneumothorax. No pleural effusion. IMPRESSION: No active cardiopulmonary disease. Electronically Signed   By: Marybelle Killings M.D.   On: 12/09/2015 08:15   US Abdomen Limited  12/09/2015  CLINICAL DATA:  Elevated liver function tests. EXAM: US ABDOMEN LIMITED - RIGHT UPPER QUADRANT COMPARISON:  CT scan of August 05, 2013. FINDINGS: Gallbladder: Status post cholecystectomy. Common bile duct: Diameter: 6 mm which is within normal limits. Liver: No focal lesion identified. Hepatic parenchyma has slightly nodular contours and slightly heterogeneous parenchyma suggesting hepatic cirrhosis. IMPRESSION:  Status post cholecystectomy.  Possible hepatic cirrhosis. Electronically Signed   By: Marijo Conception, M.D.   On: 12/09/2015 10:47    Microbiology: No results found for this or any previous visit (from the past 240 hour(s)).   Labs: Basic Metabolic Panel:  Recent Labs Lab 12/09/15 0750 12/10/15 0555  NA 135 137  K 3.6 3.6  CL 104 106  CO2 23 26  GLUCOSE 94 122*  BUN 6 <5*  CREATININE 0.40* 0.37*  CALCIUM 8.8* 8.1*   Liver Function Tests:  Recent Labs Lab 12/09/15 0750  AST 376*  ALT 104*  ALKPHOS 129*  BILITOT 10.9*  PROT 8.3*  ALBUMIN 3.3*   CBC:  Recent Labs Lab 12/09/15 0750 12/10/15 0555 12/12/15 0709  WBC 7.4 9.2 10.5  NEUTROABS 4.3  --   --   HGB 9.5* 7.4* 7.2*  HCT 26.1* 20.3* 19.6*  MCV 99.6 100.0 99.0  PLT 173 173 162   Cardiac Enzymes:  Recent Labs Lab 12/09/15 0750  TROPONINI <0.03   Signed:  [name] MD.  [facility] 12/14/2015, 11:15 AM

## 2015-12-14 NOTE — Progress Notes (Signed)
Pt IV removed, tolerated well. Reviewed discharge instructions with pt, answered all questions at this time.

## 2015-12-14 NOTE — Care Management Note (Signed)
Case Management Note  Patient Details  Name: Dawn [name] MRN: [id number] Date of Birth: August 07, 1981  Subjective/Objective:                  Pt admitted with [facility]. Pt is from home and ind with ADL's. PT has no HH services prior to admission. Pt has PCP, insurance, transportation and no difficulty obtaining medications.   Action/Plan: Pt discharging home today with self care. No CM needs.   Expected Discharge Date:  12/12/15               Expected Discharge Plan:  Home/Self Care  In-House Referral:  NA  Discharge planning Services  CM Consult  Post Acute Care Choice:  NA Choice offered to:  NA  DME Arranged:    DME Agency:     HH Arranged:    HH Agency:     Status of Service:  Completed, signed off  Medicare Important Message Given:    Date Medicare IM Given:    Medicare IM give by:    Date Additional Medicare IM Given:    Additional Medicare Important Message give by:     If discussed at [place] of Stay Meetings, dates discussed:    Additional Comments:  Sherald Barge, RN 12/14/2015, 11:17 AM

## 2015-12-31 ENCOUNTER — Encounter (HOSPITAL_COMMUNITY): Payer: Self-pay | Admitting: Emergency Medicine

## 2015-12-31 ENCOUNTER — Emergency Department (HOSPITAL_COMMUNITY)
Admission: EM | Admit: 2015-12-31 | Discharge: 2015-12-31 | Disposition: A | Discharge status: Home / Self Care | Payer: BLUE CROSS/BLUE SHIELD | Attending: Emergency Medicine | Admitting: Emergency Medicine

## 2015-12-31 DIAGNOSIS — Z79899 Other long term (current) drug therapy: Secondary | ICD-10-CM | POA: Diagnosis not present

## 2015-12-31 DIAGNOSIS — Z791 Long term (current) use of non-steroidal anti-inflammatories (NSAID): Secondary | ICD-10-CM | POA: Insufficient documentation

## 2015-12-31 DIAGNOSIS — D5700 Hb-SS disease with crisis, unspecified: Secondary | ICD-10-CM | POA: Insufficient documentation

## 2015-12-31 LAB — COMPREHENSIVE METABOLIC PANEL
ALT: 31 U/L (ref 14–54)
AST: 76 U/L — ABNORMAL HIGH (ref 15–41)
Albumin: 3.2 g/dL — ABNORMAL LOW (ref 3.5–5.0)
Alkaline Phosphatase: 133 U/L — ABNORMAL HIGH (ref 38–126)
Anion gap: 8 (ref 5–15)
BUN: 7 mg/dL (ref 6–20)
CO2: 21 mmol/L — ABNORMAL LOW (ref 22–32)
Calcium: 8.2 mg/dL — ABNORMAL LOW (ref 8.9–10.3)
Chloride: 107 mmol/L (ref 101–111)
Creatinine, Ser: 0.44 mg/dL (ref 0.44–1.00)
GFR calc Af Amer: >60 mL/min (ref >60)
GFR calc non Af Amer: >60 mL/min (ref >60)
Glucose, Bld: 85 mg/dL (ref 65–99)
Potassium: 3.5 mmol/L (ref 3.5–5.1)
Sodium: 136 mmol/L (ref 135–145)
Total Bilirubin: 10.9 mg/dL — ABNORMAL HIGH (ref 0.3–1.2)
Total Protein: 8.1 g/dL (ref 6.5–8.1)

## 2015-12-31 LAB — CBC WITH DIFFERENTIAL/PLATELET
Band Neutrophils: 0 %
Basophils Absolute: 0 10*3/uL (ref 0.0–0.1)
Basophils Relative: 0 %
Blasts: 0 %
Eosinophils Absolute: 0 10*3/uL (ref 0.0–0.7)
Eosinophils Relative: 0 %
HCT: 23.4 % — ABNORMAL LOW (ref 36.0–46.0)
Hemoglobin: 8.5 g/dL — ABNORMAL LOW (ref 12.0–15.0)
Lymphocytes Relative: 28 %
Lymphs Abs: 2.5 10*3/uL (ref 0.7–4.0)
MCH: 35.9 pg — ABNORMAL HIGH (ref 26.0–34.0)
MCHC: 36.3 g/dL — ABNORMAL HIGH (ref 30.0–36.0)
MCV: 98.7 fL (ref 78.0–100.0)
Metamyelocytes Relative: 0 %
Monocytes Absolute: 1.7 10*3/uL — ABNORMAL HIGH (ref 0.1–1.0)
Monocytes Relative: 19 %
Myelocytes: 0 %
Neutro Abs: 4.6 10*3/uL (ref 1.7–7.7)
Neutrophils Relative %: 53 %
Other: 0 %
Platelets: 187 10*3/uL (ref 150–400)
Promyelocytes Absolute: 0 %
RBC: 2.37 MIL/uL — ABNORMAL LOW (ref 3.87–5.11)
RDW: 19.1 % — ABNORMAL HIGH (ref 11.5–15.5)
WBC: 8.8 10*3/uL (ref 4.0–10.5)
nRBC: 6 /100 WBC — ABNORMAL HIGH

## 2015-12-31 LAB — RETICULOCYTES
RBC.: 2.37 MIL/uL — ABNORMAL LOW (ref 3.87–5.11)
Retic Count, Absolute: 540.4 10*3/uL — ABNORMAL HIGH (ref 19.0–186.0)
Retic Ct Pct: 22.8 % — ABNORMAL HIGH (ref 0.4–3.1)

## 2015-12-31 LAB — URINALYSIS, ROUTINE W REFLEX MICROSCOPIC
Glucose, UA: NEGATIVE mg/dL
Ketones, ur: NEGATIVE mg/dL
Leukocytes, UA: NEGATIVE
Nitrite: NEGATIVE
Protein, ur: 100 mg/dL — AB
Specific Gravity, Urine: 1.02 (ref 1.005–1.030)
pH: 5.5 (ref 5.0–8.0)

## 2015-12-31 LAB — URINE MICROSCOPIC-ADD ON: WBC, UA: NONE SEEN WBC/hpf (ref 0–5)

## 2015-12-31 MED ORDER — KETOROLAC TROMETHAMINE 30 MG/ML IJ SOLN
15.0000 mg | Freq: Once | INTRAMUSCULAR | Status: AC
  Administered 2015-12-31: 15 mg via INTRAVENOUS
  Filled 2015-12-31: qty 1

## 2015-12-31 MED ORDER — PROCHLORPERAZINE EDISYLATE 5 MG/ML IJ SOLN
10.0000 mg | Freq: Once | INTRAMUSCULAR | Status: AC
  Administered 2015-12-31: 10 mg via INTRAVENOUS
  Filled 2015-12-31: qty 2

## 2015-12-31 MED ORDER — DEXTROSE-NACL 5-0.45 % IV SOLN
INTRAVENOUS | Status: DC
Start: 2015-12-31 — End: 2016-01-01
  Administered 2015-12-31: 100 mL/h via INTRAVENOUS

## 2015-12-31 MED ORDER — HYDROMORPHONE HCL 2 MG/ML IJ SOLN
2.0000 mg | Freq: Once | INTRAMUSCULAR | Status: AC
  Administered 2015-12-31: 2 mg via INTRAVENOUS
  Filled 2015-12-31: qty 1

## 2015-12-31 MED ORDER — HYDROMORPHONE HCL 1 MG/ML IJ SOLN
1.0000 mg | Freq: Once | INTRAMUSCULAR | Status: AC
  Administered 2015-12-31: 1 mg via INTRAVENOUS
  Filled 2015-12-31: qty 1

## 2015-12-31 MED ORDER — DIPHENHYDRAMINE HCL 50 MG/ML IJ SOLN
25.0000 mg | Freq: Once | INTRAMUSCULAR | Status: AC
  Administered 2015-12-31: 25 mg via INTRAVENOUS
  Filled 2015-12-31: qty 1

## 2015-12-31 MED ORDER — HYDROMORPHONE HCL 1 MG/ML IJ SOLN
1.0000 mg | Freq: Once | INTRAMUSCULAR | Status: AC
Start: 2015-12-31 — End: 2015-12-31
  Administered 2015-12-31: 1 mg via INTRAVENOUS
  Filled 2015-12-31: qty 1

## 2015-12-31 NOTE — ED Provider Notes (Signed)
CSN: [id number]     Arrival date & time 12/31/15  1640 History   First MD Initiated Contact with Patient 12/31/15 1652     Chief Complaint  Patient presents with  . Sickle Cell Pain Crisis     (Consider location/radiation/quality/duration/timing/severity/associated sxs/prior Treatment) HPI   35 year old female with body pain. She is a past history of sickle cell anemia. She states her current symptoms feel like a vaso-occlusive crisis. Symptom onset yesterday. Minimal relief with home medications. Pain in lower back, thighs and bilateral arms. No fevers or chills. No abdominal pain. No cough. No shortness of breath. No unusual rash or joint swelling.  Past Medical History  Diagnosis Date  . Sickle cell disease ([place])   . 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 Blood transfusion   . 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 HCAP (healthcare-associated pneumonia) 05/19/2013  . Sickle cell anemia (HCC)   . 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 Vitamin B12 deficiency 07/07/2015   Past Surgical History  Procedure Laterality Date  . Cholecystectomy    .  left knee acl reconstruction    . Cesarean section      x 2  . Portacath placement    . Portacath placement Left 02/23/2013    Procedure: INSERTION PORT-A-CATH;  Surgeon: Donato Heinz, MD;  Location: [facility];  Service: General;  Laterality: Left;  . Port-a-cath removal Left 07/29/2013    Procedure: REMOVAL PORT-A-CATH;  Surgeon: Scherry Ran, MD;  Location: [facility];  Service: General;  Laterality: Left;  . Tee without cardioversion N/A 08/07/2013    Procedure: TRANSESOPHAGEAL ECHOCARDIOGRAM (TEE);  Surgeon: Arnoldo Lenis, MD;  Location: [facility];  Service: Cardiology;  Laterality: N/A;   Family History  Problem Relation Age of Onset  . Sickle cell trait Mother   . Sickle cell trait Father   . Hypertension Father   . Diabetes Father   . Sickle cell trait Sister   . Cancer Paternal Aunt     Breast   Social History   Substance Use Topics  . Smoking status: Never Smoker   . Smokeless tobacco: Never Used  . Alcohol Use: No   OB History    Gravida Para Term Preterm AB TAB SAB Ectopic Multiple Living   1 1  1      1      Review of Systems  All systems reviewed and negative, other than as noted in HPI.   Allergies  Desferal; Latex; Lisinopril; and Tape  Home Medications   Prior to Admission medications   Medication Sig Start Date End Date Taking? Authorizing Provider  acetaminophen (TYLENOL) 500 MG tablet Take 500 mg by mouth every 6 (six) hours as needed.   Yes Historical Provider, MD  folic acid (FOLVITE) 1 MG tablet Take 1 tablet (1 mg total) by mouth daily. 08/16/15  Yes [name], FNP  hydroxyurea (HYDREA) 500 MG capsule Take 2 capsules (1,000 mg total) by mouth daily. May take with food to minimize GI side effects. 09/05/15  Yes [name], FNP  ibuprofen (ADVIL,MOTRIN) 200 MG tablet Take 200 mg by mouth every 6 (six) hours as needed for mild pain or moderate pain.   Yes Historical Provider, MD  levonorgestrel (MIRENA) 20 MCG/24HR IUD 1 each by Intrauterine route once.   Yes Historical Provider, MD  loratadine (CLARITIN) 10 MG tablet Take 10 mg by mouth daily.   Yes Historical Provider, MD  morphine (MS CONTIN) 15 MG 12  hr tablet Take 1 tablet (15 mg total) by mouth every 12 (twelve) hours. 10/06/15  Yes [name], FNP  Multiple Vitamin (MULTIVITAMIN WITH MINERALS) TABS tablet Take 1 tablet by mouth daily.   Yes Historical Provider, MD  Oxycodone HCl 10 MG TABS Take 1 tablet (10 mg total) by mouth every 6 (six) hours as needed. Patient taking differently: Take 10 mg by mouth every 6 (six) hours as needed (breakthrough pain).  10/06/15  Yes [name], FNP  promethazine (PHENERGAN) 25 MG tablet Take 1 tablet (25 mg total) by mouth every 6 (six) hours as needed for nausea or vomiting. 12/07/15  Yes [name], MD  Vitamin D, Ergocalciferol, (DRISDOL) 50000 UNITS CAPS  capsule Take 1 capsule (50,000 Units total) by mouth every 7 (seven) days. Takes on Sundays. 08/16/15  Yes [name], FNP  cyclobenzaprine (FLEXERIL) 10 MG tablet Take 1 tablet (10 mg total) by mouth 3 (three) times daily as needed for muscle spasms. Patient not taking: Reported on 12/31/2015 12/14/15   [name], MD  hydrOXYzine (ATARAX/VISTARIL) 10 MG tablet Take 1 tablet (10 mg total) by mouth 3 (three) times daily as needed for itching. Patient not taking: Reported on 12/31/2015 12/14/15   [name], MD   BP 143/81 mmHg  Pulse 95  Temp(Src) 99.9 F (37.7 C) (Temporal)  Resp 17  Ht 5\' 3"  (1.6 m)  Wt 168 lb (76.204 kg)  BMI 29.77 kg/m2  SpO2 100% Physical Exam  Constitutional: She appears well-developed and well-nourished. No distress.  HENT:  Head: Normocephalic and atraumatic.  Eyes: Conjunctivae are normal. Right eye exhibits no discharge. Left eye exhibits no discharge.  Neck: Neck supple.  Cardiovascular: Normal rate, regular rhythm and normal heart sounds.  Exam reveals no gallop and no friction rub.   No murmur heard. Pulmonary/Chest: Effort normal and breath sounds normal. No respiratory distress.  Abdominal: Soft. She exhibits no distension. There is no tenderness.  Musculoskeletal: She exhibits no edema or tenderness.  Lower extremities symmetric as compared to each other. No calf tenderness. Negative Homan's. No palpable cords.   Neurological: She is alert.  Skin: Skin is warm and dry. No rash noted.  Psychiatric: She has a normal mood and affect. Her behavior is normal. Thought content normal.  Nursing note and vitals reviewed.   ED Course  Procedures (including critical care time) Labs Review Labs Reviewed  CBC WITH DIFFERENTIAL/PLATELET - Abnormal; Notable for the following:    RBC 2.37 (*)    Hemoglobin 8.5 (*)    HCT 23.4 (*)    MCH 35.9 (*)    MCHC 36.3 (*)    RDW 19.1 (*)    nRBC 6 (*)    Monocytes Absolute 1.7 (*)    All other components within  normal limits  COMPREHENSIVE METABOLIC PANEL - Abnormal; Notable for the following:    CO2 21 (*)    Calcium 8.2 (*)    Albumin 3.2 (*)    AST 76 (*)    Alkaline Phosphatase 133 (*)    Total Bilirubin 10.9 (*)    All other components within normal limits  RETICULOCYTES - Abnormal; Notable for the following:    Retic Ct Pct 22.8 (*)    RBC. 2.37 (*)    Retic Count, Manual 540.4 (*)    All other components within normal limits  URINALYSIS, ROUTINE W REFLEX MICROSCOPIC (NOT AT [facility]) - Abnormal; Notable for the following:    Color, Urine AMBER (*)  Hgb urine dipstick LARGE (*)    Bilirubin Urine MODERATE (*)    Protein, ur 100 (*)    All other components within normal limits  URINE MICROSCOPIC-ADD ON - Abnormal; Notable for the following:    Squamous Epithelial / LPF 6-30 (*)    Bacteria, UA RARE (*)    All other components within normal limits    Imaging Review No results found. I have personally reviewed and evaluated these images and lab results as part of my medical decision-making.   EKG Interpretation None      MDM   Final diagnoses:  Sickle cell pain crisis ([place])    35 year old female with sickle cell pain crisis. Temperature 99.9 noted. Denies subjective fever. No leukocytosis. No obvious infectious symptomatology. Hemoglobin is close to baseline. She is treated symptomatically with improvement of her symptoms. She feels like she would like to go home. I have low suspicion for emergent complication of sickle cell anemia or otherwise. Return precautions discussed.    Virgel Manifold, MD 01/11/16 1329

## 2015-12-31 NOTE — ED Notes (Signed)
Patient c/o sickle cell pain in arms, legs, and back pain. Per patient taking morphine, oxycodone, and ibuprofen with no relief.

## 2015-12-31 NOTE — Discharge Instructions (Signed)
Sickle Cell Anemia, Adult Sickle cell anemia is a condition in which red blood cells have an abnormal "sickle" shape. This abnormal shape shortens the cells' life span, which results in a lower than normal concentration of red blood cells in the blood. The sickle shape also causes the cells to clump together and block free blood flow through the blood vessels. As a result, the tissues and organs of the body do not receive enough oxygen. Sickle cell anemia causes organ damage and pain and increases the risk of infection. CAUSES  Sickle cell anemia is a genetic disorder. Those who receive two copies of the gene have the condition, and those who receive one copy have the trait. RISK FACTORS The sickle cell gene is most common in people whose families originated in [place]. Other areas of the globe where sickle cell trait occurs include the [place], [place] and [place], the [place], and the [place].  SIGNS AND SYMPTOMS  Pain, especially in the extremities, back, chest, or abdomen (common). The pain may start suddenly or may develop following an illness, especially if there is dehydration. Pain can also occur due to overexertion or exposure to extreme temperature changes.  Frequent severe bacterial infections, especially certain types of pneumonia and meningitis.  Pain and swelling in the hands and feet.  Decreased activity.   Loss of appetite.   Change in behavior.  Headaches.  Seizures.  Shortness of breath or difficulty breathing.  Vision changes.  Skin ulcers. Those with the trait may not have symptoms or they may have mild symptoms.  DIAGNOSIS  Sickle cell anemia is diagnosed with blood tests that demonstrate the genetic trait. It is often diagnosed during the newborn period, due to mandatory testing nationwide. A variety of blood tests, X-rays, CT scans, MRI scans, ultrasounds, and lung function tests may also be done to monitor the condition. TREATMENT  Sickle  cell anemia may be treated with:  Medicines. You may be given pain medicines, antibiotic medicines (to treat and prevent infections) or medicines to increase the production of certain types of hemoglobin.  Fluids.  Oxygen.  Blood transfusions. HOME CARE INSTRUCTIONS   Drink enough fluid to keep your urine clear or pale yellow. Increase your fluid intake in hot weather and during exercise.  Do not smoke. Smoking lowers oxygen levels in the blood.   Only take over-the-counter or prescription medicines for pain, fever, or discomfort as directed by your health care provider.  Take antibiotics as directed by your health care provider. Make sure you finish them it even if you start to feel better.   Take supplements as directed by your health care provider.   Consider wearing a medical alert bracelet. This tells anyone caring for you in an emergency of your condition.   When traveling, keep your medical information, health care provider's names, and the medicines you take with you at all times.   If you develop a fever, do not take medicines to reduce the fever right away. This could cover up a problem that is developing. Notify your health care provider.  Keep all follow-up appointments with your health care provider. Sickle cell anemia requires regular medical care. SEEK MEDICAL CARE IF: You have a fever. SEEK IMMEDIATE MEDICAL CARE IF:   You feel dizzy or faint.   You have new abdominal pain, especially on the left side near the stomach area.   You develop a persistent, often uncomfortable and painful penile erection (priapism). If this is not treated immediately it   will lead to impotence.   You have numbness your arms or legs or you have a hard time moving them.   You have a hard time with speech.   You have a fever or persistent symptoms for more than 2-3 days.   You have a fever and your symptoms suddenly get worse.   You have signs or symptoms of infection.  These include:   Chills.   Abnormal tiredness (lethargy).   Irritability.   Poor eating.   Vomiting.   You develop pain that is not helped with medicine.   You develop shortness of breath.  You have pain in your chest.   You are coughing up pus-like or bloody sputum.   You develop a stiff neck.  Your feet or hands swell or have pain.  Your abdomen appears bloated.  You develop joint pain. MAKE SURE YOU:  Understand these instructions.   This information is not intended to replace advice given to you by your health care provider. Make sure you discuss any questions you have with your health care provider.   Document Released: 12/19/2005 Document Revised: 10/01/2014 Document Reviewed: 04/22/2013 Elsevier Interactive Patient Education 2016 [employer]  

## 2016-01-10 ENCOUNTER — Encounter (HOSPITAL_COMMUNITY): Payer: Self-pay | Admitting: *Deleted

## 2016-01-10 ENCOUNTER — Emergency Department (HOSPITAL_COMMUNITY)
Admission: EM | Admit: 2016-01-10 | Discharge: 2016-01-10 | Disposition: A | Discharge status: Home / Self Care | Payer: BLUE CROSS/BLUE SHIELD | Source: Home / Self Care | Attending: Emergency Medicine | Admitting: Emergency Medicine

## 2016-01-10 DIAGNOSIS — Z79899 Other long term (current) drug therapy: Secondary | ICD-10-CM | POA: Insufficient documentation

## 2016-01-10 DIAGNOSIS — D5700 Hb-SS disease with crisis, unspecified: Secondary | ICD-10-CM | POA: Insufficient documentation

## 2016-01-10 DIAGNOSIS — Z791 Long term (current) use of non-steroidal anti-inflammatories (NSAID): Secondary | ICD-10-CM | POA: Insufficient documentation

## 2016-01-10 DIAGNOSIS — R319 Hematuria, unspecified: Secondary | ICD-10-CM | POA: Insufficient documentation

## 2016-01-10 DIAGNOSIS — M791 Myalgia: Secondary | ICD-10-CM

## 2016-01-10 LAB — CBC WITH DIFFERENTIAL/PLATELET
Basophils Absolute: 0.1 10*3/uL (ref 0.0–0.1)
Basophils Relative: 1 %
EOS ABS: 0.1 10*3/uL (ref 0.0–0.7)
Eosinophils Relative: 1 %
HCT: 22.6 % — ABNORMAL LOW (ref 36.0–46.0)
HEMOGLOBIN: 8.2 g/dL — AB (ref 12.0–15.0)
LYMPHS ABS: 2.5 10*3/uL (ref 0.7–4.0)
Lymphocytes Relative: 28 %
MCH: 35.8 pg — AB (ref 26.0–34.0)
MCHC: 36.3 g/dL — AB (ref 30.0–36.0)
MCV: 98.7 fL (ref 78.0–100.0)
MONO ABS: 1.5 10*3/uL — AB (ref 0.1–1.0)
MONOS PCT: 17 %
NEUTROS ABS: 4.6 10*3/uL (ref 1.7–7.7)
NEUTROS PCT: 53 %
Platelets: ADEQUATE 10*3/uL (ref 150–400)
RBC: 2.29 MIL/uL — ABNORMAL LOW (ref 3.87–5.11)
RDW: 18.7 % — ABNORMAL HIGH (ref 11.5–15.5)
WBC: 8.7 10*3/uL (ref 4.0–10.5)

## 2016-01-10 LAB — URINALYSIS, ROUTINE W REFLEX MICROSCOPIC
Glucose, UA: NEGATIVE mg/dL
Ketones, ur: NEGATIVE mg/dL
LEUKOCYTES UA: NEGATIVE
Nitrite: NEGATIVE
PROTEIN: 100 mg/dL — AB
Specific Gravity, Urine: 1.015 (ref 1.005–1.030)
pH: 6 (ref 5.0–8.0)

## 2016-01-10 LAB — URINE MICROSCOPIC-ADD ON

## 2016-01-10 LAB — COMPREHENSIVE METABOLIC PANEL
ALBUMIN: 3.1 g/dL — AB (ref 3.5–5.0)
ALK PHOS: 134 U/L — AB (ref 38–126)
ALT: 33 U/L (ref 14–54)
AST: 94 U/L — ABNORMAL HIGH (ref 15–41)
Anion gap: 10 (ref 5–15)
BUN: 5 mg/dL — ABNORMAL LOW (ref 6–20)
CHLORIDE: 106 mmol/L (ref 101–111)
CO2: 23 mmol/L (ref 22–32)
CREATININE: 0.41 mg/dL — AB (ref 0.44–1.00)
Calcium: 8.4 mg/dL — ABNORMAL LOW (ref 8.9–10.3)
GFR calc non Af Amer: >60 mL/min (ref >60)
GLUCOSE: 101 mg/dL — AB (ref 65–99)
Potassium: 3.6 mmol/L (ref 3.5–5.1)
SODIUM: 139 mmol/L (ref 135–145)
Total Bilirubin: 12.3 mg/dL — ABNORMAL HIGH (ref 0.3–1.2)
Total Protein: 8 g/dL (ref 6.5–8.1)

## 2016-01-10 LAB — PREGNANCY, URINE: PREG TEST UR: NEGATIVE

## 2016-01-10 LAB — RETICULOCYTES
RBC.: 2.35 MIL/uL — AB (ref 3.87–5.11)
RETIC COUNT ABSOLUTE: 613.4 10*3/uL — AB (ref 19.0–186.0)
Retic Ct Pct: 26.1 % — ABNORMAL HIGH (ref 0.4–3.1)

## 2016-01-10 MED ORDER — HYDROMORPHONE HCL 2 MG/ML IJ SOLN: 0.0250 mg/kg | INTRAMUSCULAR | Status: AC

## 2016-01-10 MED ORDER — HYDROMORPHONE HCL 2 MG/ML IJ SOLN
0.0250 mg/kg | INTRAMUSCULAR | Status: AC
  Administered 2016-01-10: 1.9 mg via INTRAVENOUS
  Filled 2016-01-10: qty 1

## 2016-01-10 MED ORDER — DIPHENHYDRAMINE HCL 25 MG PO CAPS: 25.0000 mg | ORAL_CAPSULE | ORAL | Status: DC | PRN

## 2016-01-10 MED ORDER — ONDANSETRON HCL 4 MG/2ML IJ SOLN
4.0000 mg | INTRAMUSCULAR | Status: DC | PRN
  Administered 2016-01-10: 4 mg via INTRAVENOUS
  Filled 2016-01-10: qty 2

## 2016-01-10 MED ORDER — SODIUM CHLORIDE 0.45 % IV SOLN
INTRAVENOUS | Status: DC
  Administered 2016-01-10: 13:00:00 via INTRAVENOUS

## 2016-01-10 NOTE — ED Notes (Signed)
EDP at bedside  

## 2016-01-10 NOTE — Discharge Instructions (Signed)
Sickle Cell Anemia, Adult Sickle cell anemia is a condition in which red blood cells have an abnormal "sickle" shape. This abnormal shape shortens the cells' life span, which results in a lower than normal concentration of red blood cells in the blood. The sickle shape also causes the cells to clump together and block free blood flow through the blood vessels. As a result, the tissues and organs of the body do not receive enough oxygen. Sickle cell anemia causes organ damage and pain and increases the risk of infection. CAUSES  Sickle cell anemia is a genetic disorder. Those who receive two copies of the gene have the condition, and those who receive one copy have the trait. RISK FACTORS The sickle cell gene is most common in people whose families originated in [place]. Other areas of the globe where sickle cell trait occurs include the [place], [place] and [place], the [place], and the [place].  SIGNS AND SYMPTOMS  Pain, especially in the extremities, back, chest, or abdomen (common). The pain may start suddenly or may develop following an illness, especially if there is dehydration. Pain can also occur due to overexertion or exposure to extreme temperature changes.  Frequent severe bacterial infections, especially certain types of pneumonia and meningitis.  Pain and swelling in the hands and feet.  Decreased activity.   Loss of appetite.   Change in behavior.  Headaches.  Seizures.  Shortness of breath or difficulty breathing.  Vision changes.  Skin ulcers. Those with the trait may not have symptoms or they may have mild symptoms.  DIAGNOSIS  Sickle cell anemia is diagnosed with blood tests that demonstrate the genetic trait. It is often diagnosed during the newborn period, due to mandatory testing nationwide. A variety of blood tests, X-rays, CT scans, MRI scans, ultrasounds, and lung function tests may also be done to monitor the condition. TREATMENT  Sickle  cell anemia may be treated with:  Medicines. You may be given pain medicines, antibiotic medicines (to treat and prevent infections) or medicines to increase the production of certain types of hemoglobin.  Fluids.  Oxygen.  Blood transfusions. HOME CARE INSTRUCTIONS   Drink enough fluid to keep your urine clear or pale yellow. Increase your fluid intake in hot weather and during exercise.  Do not smoke. Smoking lowers oxygen levels in the blood.   Only take over-the-counter or prescription medicines for pain, fever, or discomfort as directed by your health care provider.  Take antibiotics as directed by your health care provider. Make sure you finish them it even if you start to feel better.   Take supplements as directed by your health care provider.   Consider wearing a medical alert bracelet. This tells anyone caring for you in an emergency of your condition.   When traveling, keep your medical information, health care provider's names, and the medicines you take with you at all times.   If you develop a fever, do not take medicines to reduce the fever right away. This could cover up a problem that is developing. Notify your health care provider.  Keep all follow-up appointments with your health care provider. Sickle cell anemia requires regular medical care. SEEK MEDICAL CARE IF: You have a fever. SEEK IMMEDIATE MEDICAL CARE IF:   You feel dizzy or faint.   You have new abdominal pain, especially on the left side near the stomach area.   You develop a persistent, often uncomfortable and painful penile erection (priapism). If this is not treated immediately it   will lead to impotence.   You have numbness your arms or legs or you have a hard time moving them.   You have a hard time with speech.   You have a fever or persistent symptoms for more than 2-3 days.   You have a fever and your symptoms suddenly get worse.   You have signs or symptoms of infection.  These include:   Chills.   Abnormal tiredness (lethargy).   Irritability.   Poor eating.   Vomiting.   You develop pain that is not helped with medicine.   You develop shortness of breath.  You have pain in your chest.   You are coughing up pus-like or bloody sputum.   You develop a stiff neck.  Your feet or hands swell or have pain.  Your abdomen appears bloated.  You develop joint pain. MAKE SURE YOU:  Understand these instructions.   This information is not intended to replace advice given to you by your health care provider. Make sure you discuss any questions you have with your health care provider.   Document Released: 12/19/2005 Document Revised: 10/01/2014 Document Reviewed: 04/22/2013 Elsevier Interactive Patient Education 2016 [employer]  

## 2016-01-10 NOTE — ED Provider Notes (Signed)
The pt has SSD - she has been seen multiple times in the past for same and this month 10 days ago - c/o pain that started last night - is persistent, severe and 10/10 on arrival but has no respiratory complaints including fever, cough, sob, cp.  She has no dysuria - pain is primarily in the LLE in the shin and the lower back - on exam has no tachycardia, soft NT abd, legs symmetrical, normal pulses - appears uncomfortable.  10 systems reviewed and are normal unless stated in my history above.  Baseline anemia Retic elevated appropriately Bili elevated chronically Pain meds ordered and 1st dose given without relief Redose, admit if doesn't get good improvement.  Medical screening examination/treatment/procedure(s) were conducted as a shared visit with non-physician practitioner(s) and myself.  I personally evaluated the patient during the encounter.  Clinical Impression:   Final diagnoses:  Sickle cell anemia with crisis [facility])         Noemi Chapel, MD 01/11/16 1359

## 2016-01-10 NOTE — ED Provider Notes (Signed)
CSN: [id number]     Arrival date & time 01/10/16  1140 History   First MD Initiated Contact with Patient 01/10/16 1203     Chief Complaint  Patient presents with  . Sickle Cell Pain Crisis     (Consider location/radiation/quality/duration/timing/severity/associated sxs/prior Treatment) HPI Comments: Patient reports to clinic with complaint of sick cell pain crisis. Pain located in legs and arms bilaterally and low back. Pain is 8/10, constant, aching in nature, and typical for her pain crises. Tried taking home medications and hot showers with minimal relief of pain. Triggers include UTIs, stress, and change in weather. Denies chest pain, shortness of breath, or difficulty breathing. No numbness, tingling, or weakness in her extremities. Denies any recent illness or sick contacts.   Patient is a 35 y.o. female presenting with sickle cell pain. The history is provided by the patient.  Sickle Cell Pain Crisis Location:  Back, upper extremity and lower extremity Severity:  Severe Similar to previous crisis episodes: yes   Timing:  Constant Ineffective treatments:  Prescription drugs Associated symptoms: fever   Associated symptoms: no chest pain, no cough, no headaches, no nausea, no shortness of breath and no vomiting     Past Medical History  Diagnosis Date  . Sickle cell disease ([place])   . 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 Blood transfusion   . 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 HCAP (healthcare-associated pneumonia) 05/19/2013  . Sickle cell anemia (HCC)   . 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 Vitamin B12 deficiency 07/07/2015   Past Surgical History  Procedure Laterality Date  . Cholecystectomy    .  left knee acl reconstruction    . Cesarean section      x 2  . Portacath placement    . Portacath placement Left 02/23/2013    Procedure: INSERTION PORT-A-CATH;  Surgeon: Donato Heinz, MD;  Location: [facility];  Service: General;  Laterality: Left;  . Port-a-cath  removal Left 07/29/2013    Procedure: REMOVAL PORT-A-CATH;  Surgeon: Scherry Ran, MD;  Location: [facility];  Service: General;  Laterality: Left;  . Tee without cardioversion N/A 08/07/2013    Procedure: TRANSESOPHAGEAL ECHOCARDIOGRAM (TEE);  Surgeon: Arnoldo Lenis, MD;  Location: [facility];  Service: Cardiology;  Laterality: N/A;   Family History  Problem Relation Age of Onset  . Sickle cell trait Mother   . Sickle cell trait Father   . Hypertension Father   . Diabetes Father   . Sickle cell trait Sister   . Cancer Paternal Aunt     Breast   Social History  Substance Use Topics  . Smoking status: Never Smoker   . Smokeless tobacco: Never Used  . Alcohol Use: No   OB History    Gravida Para Term Preterm AB TAB SAB Ectopic Multiple Living   1 1  1      1      Review of Systems  Constitutional: Positive for fever.  Eyes: Negative for visual disturbance.  Respiratory: Negative for cough, chest tightness and shortness of breath.   Cardiovascular: Negative for chest pain.  Gastrointestinal: Negative for nausea, vomiting, abdominal pain and blood in stool.  Genitourinary: Positive for hematuria. Negative for dysuria.  Musculoskeletal: Positive for myalgias and back pain.  Neurological: Negative for numbness and headaches.      Allergies  Desferal; Latex; Lisinopril; and Tape  Home Medications   Prior to Admission medications   Medication Sig Start Date End Date Taking?  Authorizing Provider  acetaminophen (TYLENOL) 500 MG tablet Take 500 mg by mouth every 6 (six) hours as needed.   Yes Historical Provider, MD  folic acid (FOLVITE) 1 MG tablet Take 1 tablet (1 mg total) by mouth daily. 08/16/15  Yes [name], FNP  hydroxyurea (HYDREA) 500 MG capsule Take 2 capsules (1,000 mg total) by mouth daily. May take with food to minimize GI side effects. 09/05/15  Yes [name], FNP  ibuprofen (ADVIL,MOTRIN) 200 MG tablet Take 200 mg by mouth every 6 (six)  hours as needed for mild pain or moderate pain.   Yes Historical Provider, MD  levonorgestrel (MIRENA) 20 MCG/24HR IUD 1 each by Intrauterine route once.   Yes Historical Provider, MD  loratadine (CLARITIN) 10 MG tablet Take 10 mg by mouth daily.   Yes Historical Provider, MD  morphine (MS CONTIN) 15 MG 12 hr tablet Take 1 tablet (15 mg total) by mouth every 12 (twelve) hours. 10/06/15  Yes [name], FNP  Multiple Vitamin (MULTIVITAMIN WITH MINERALS) TABS tablet Take 1 tablet by mouth daily.   Yes Historical Provider, MD  Oxycodone HCl 10 MG TABS Take 1 tablet (10 mg total) by mouth every 6 (six) hours as needed. Patient taking differently: Take 10 mg by mouth every 6 (six) hours as needed (breakthrough pain).  10/06/15  Yes [name], FNP  promethazine (PHENERGAN) 25 MG tablet Take 1 tablet (25 mg total) by mouth every 6 (six) hours as needed for nausea or vomiting. 12/07/15  Yes [name], MD  Vitamin D, Ergocalciferol, (DRISDOL) 50000 UNITS CAPS capsule Take 1 capsule (50,000 Units total) by mouth every 7 (seven) days. Takes on Sundays. 08/16/15  Yes [name], FNP  cyclobenzaprine (FLEXERIL) 10 MG tablet Take 1 tablet (10 mg total) by mouth 3 (three) times daily as needed for muscle spasms. Patient not taking: Reported on 12/31/2015 12/14/15   [name], MD  hydrOXYzine (ATARAX/VISTARIL) 10 MG tablet Take 1 tablet (10 mg total) by mouth 3 (three) times daily as needed for itching. Patient not taking: Reported on 12/31/2015 12/14/15   [name], MD   BP 153/95 mmHg  Pulse 89  Temp(Src) 98.8 F (37.1 C) (Oral)  Resp 14  Ht 5\' 3"  (1.6 m)  Wt 75.751 kg  BMI 29.59 kg/m2  SpO2 95% Physical Exam  Constitutional: She is oriented to person, place, and time. She appears well-developed and well-nourished. No distress.  HENT:  Head: Normocephalic and atraumatic.  Eyes: Pupils are equal, round, and reactive to light. Scleral icterus is present.  Neck: Normal range of motion.    Cardiovascular: Normal rate, normal heart sounds and intact distal pulses.  Exam reveals no gallop and no friction rub.   No murmur heard. Pulmonary/Chest: Effort normal and breath sounds normal.  Abdominal: There is no CVA tenderness.  Musculoskeletal: Normal range of motion. She exhibits tenderness.       Arms:      Legs: Neurological: She is alert and oriented to person, place, and time.  Skin: Skin is warm and dry. She is not diaphoretic.  Psychiatric: She has a normal mood and affect. Her behavior is normal.    ED Course  Procedures (including critical care time) Labs Review Labs Reviewed  COMPREHENSIVE METABOLIC PANEL - Abnormal; Notable for the following:    Glucose, Bld 101 (*)    BUN 5 (*)    Creatinine, Ser 0.41 (*)    Calcium 8.4 (*)  Albumin 3.1 (*)    AST 94 (*)    Alkaline Phosphatase 134 (*)    Total Bilirubin 12.3 (*)    All other components within normal limits  CBC WITH DIFFERENTIAL/PLATELET - Abnormal; Notable for the following:    RBC 2.29 (*)    Hemoglobin 8.2 (*)    HCT 22.6 (*)    MCH 35.8 (*)    MCHC 36.3 (*)    RDW 18.7 (*)    Monocytes Absolute 1.5 (*)    All other components within normal limits  URINALYSIS, ROUTINE W REFLEX MICROSCOPIC (NOT AT [facility]) - Abnormal; Notable for the following:    Color, Urine AMBER (*)    Hgb urine dipstick LARGE (*)    Bilirubin Urine MODERATE (*)    Protein, ur 100 (*)    All other components within normal limits  RETICULOCYTES - Abnormal; Notable for the following:    Retic Ct Pct 26.1 (*)    RBC. 2.35 (*)    Retic Count, Manual 613.4 (*)    All other components within normal limits  URINE MICROSCOPIC-ADD ON - Abnormal; Notable for the following:    Squamous Epithelial / LPF 0-5 (*)    Bacteria, UA RARE (*)    All other components within normal limits  PREGNANCY, URINE    Imaging Review No results found.    EKG Interpretation None      MDM   Final diagnoses:  Sickle cell anemia with crisis  (HCC)   RBC, HGB, and HCT are low, but stable compared to previous labs. AST elevated, alkaline phosphate, and total bilirubin are elevated; however, stable compared to previous labs. MCHC, MCH, and RDW chronically elevated and stable compared to previous labs. Reticulocyte count is elevated and similar to previous labs, less concerned for aplastic crisis. Creatinine and BUN are low, but stable compared to previous labs. Calcium and albumin chronically low and stable compared to previous labs.   1320: Patient re-evaluated following dilaudid. Pain not improved, pain level still 8/10. Patient to receive second dose of dilaudid.     1426: Patient re-evaluated following second dose of dilaudid. Pain level improved 5-6/10. Awaiting UA. Will give third dose of dilaudid for further pain reduction.    1500: patient re-evaluated following third dose of dilaudid. States improvement of pain to 4/10. UA negative for UTI. However, protein and hemoglobin noted on urinalysis, both of which have been consistent findings on previous urinalysis. Furthermore, this may be secondary to increased RBC breakdown.   1600: Patient states pain level is 3/10. She feels comfortable to go home. Some question regarding kidney infection. Less likely provided no fever, CVA tenderness, and negative nitrites and leukocytes on UA.   Discussed with patient importance of following up with hematologist - she has scheduled appointment on Friday. Discussed returning to the ED if symptoms worsen or develop chest pain, shortness of breath, cough, or fevers. Patient voiced understanding and is agreeable.   Roxanna Mew, PA-C 01/10/16 [address] [place], PA-C 01/10/16 [id number]  Noemi Chapel, MD 01/11/16 [id number]

## 2016-01-10 NOTE — ED Notes (Signed)
Pt. C/o pain in legs, back, arms. Taking pain meds and anti-inflammatory without relief.

## 2016-01-11 ENCOUNTER — Encounter (HOSPITAL_COMMUNITY): Payer: Self-pay

## 2016-01-11 ENCOUNTER — Inpatient Hospital Stay (HOSPITAL_COMMUNITY)
Admission: EM | Admit: 2016-01-11 | Discharge: 2016-01-15 | DRG: 812 | Disposition: A | Discharge status: Home / Self Care | Payer: BLUE CROSS/BLUE SHIELD | Attending: Internal Medicine | Admitting: Internal Medicine

## 2016-01-11 ENCOUNTER — Emergency Department (HOSPITAL_COMMUNITY): Payer: BLUE CROSS/BLUE SHIELD

## 2016-01-11 DIAGNOSIS — D571 Sickle-cell disease without crisis: Secondary | ICD-10-CM

## 2016-01-11 DIAGNOSIS — Z809 Family history of malignant neoplasm, unspecified: Secondary | ICD-10-CM | POA: Diagnosis not present

## 2016-01-11 DIAGNOSIS — D5700 Hb-SS disease with crisis, unspecified: Secondary | ICD-10-CM | POA: Diagnosis present

## 2016-01-11 DIAGNOSIS — Z833 Family history of diabetes mellitus: Secondary | ICD-10-CM | POA: Diagnosis not present

## 2016-01-11 DIAGNOSIS — R509 Fever, unspecified: Secondary | ICD-10-CM

## 2016-01-11 DIAGNOSIS — Z8249 Family history of ischemic heart disease and other diseases of the circulatory system: Secondary | ICD-10-CM

## 2016-01-11 LAB — RETICULOCYTES
RBC.: 2.22 MIL/uL — ABNORMAL LOW (ref 3.87–5.11)
RBC.: 2.23 MIL/uL — AB (ref 3.87–5.11)
Retic Count, Absolute: 535.2 10*3/uL — ABNORMAL HIGH (ref 19.0–186.0)
Retic Ct Pct: >23 % — ABNORMAL HIGH (ref 0.4–3.1)
Retic Ct Pct: 24 % — ABNORMAL HIGH (ref 0.4–3.1)

## 2016-01-11 LAB — COMPREHENSIVE METABOLIC PANEL
ALK PHOS: 134 U/L — AB (ref 38–126)
ALK PHOS: 134 U/L — AB (ref 38–126)
ALT: 33 U/L (ref 14–54)
ALT: 34 U/L (ref 14–54)
ANION GAP: 8 (ref 5–15)
AST: 98 U/L — ABNORMAL HIGH (ref 15–41)
AST: 94 U/L — ABNORMAL HIGH (ref 15–41)
Albumin: 3 g/dL — ABNORMAL LOW (ref 3.5–5.0)
Albumin: 3.1 g/dL — ABNORMAL LOW (ref 3.5–5.0)
Anion gap: 6 (ref 5–15)
BILIRUBIN TOTAL: 11 mg/dL — AB (ref 0.3–1.2)
BUN: 5 mg/dL — ABNORMAL LOW (ref 6–20)
BUN: <5 mg/dL — ABNORMAL LOW (ref 6–20)
CALCIUM: 8.5 mg/dL — AB (ref 8.9–10.3)
CALCIUM: 8.5 mg/dL — AB (ref 8.9–10.3)
CO2: 25 mmol/L (ref 22–32)
CO2: 22 mmol/L (ref 22–32)
CREATININE: 0.34 mg/dL — AB (ref 0.44–1.00)
Chloride: 106 mmol/L (ref 101–111)
Chloride: 109 mmol/L (ref 101–111)
Creatinine, Ser: 0.38 mg/dL — ABNORMAL LOW (ref 0.44–1.00)
GFR calc non Af Amer: >60 mL/min (ref >60)
GFR calc non Af Amer: >60 mL/min (ref >60)
Glucose, Bld: 98 mg/dL (ref 65–99)
Glucose, Bld: 97 mg/dL (ref 65–99)
Potassium: 3.6 mmol/L (ref 3.5–5.1)
Potassium: 3.7 mmol/L (ref 3.5–5.1)
SODIUM: 137 mmol/L (ref 135–145)
Sodium: 139 mmol/L (ref 135–145)
TOTAL PROTEIN: 7.8 g/dL (ref 6.5–8.1)
Total Bilirubin: 11.4 mg/dL — ABNORMAL HIGH (ref 0.3–1.2)
Total Protein: 7.9 g/dL (ref 6.5–8.1)

## 2016-01-11 LAB — CBC WITH DIFFERENTIAL/PLATELET
BASOS ABS: 0 10*3/uL (ref 0.0–0.1)
BASOS ABS: 0 10*3/uL (ref 0.0–0.1)
BASOS PCT: 0 %
Basophils Relative: 1 %
EOS PCT: 1 %
Eosinophils Absolute: 0.1 10*3/uL (ref 0.0–0.7)
Eosinophils Absolute: 0.1 10*3/uL (ref 0.0–0.7)
Eosinophils Relative: 2 %
HEMATOCRIT: 21.9 % — AB (ref 36.0–46.0)
HEMATOCRIT: 21.9 % — AB (ref 36.0–46.0)
HEMOGLOBIN: 8 g/dL — AB (ref 12.0–15.0)
Hemoglobin: 8.1 g/dL — ABNORMAL LOW (ref 12.0–15.0)
LYMPHS ABS: 1.9 10*3/uL (ref 0.7–4.0)
LYMPHS PCT: 24 %
LYMPHS PCT: 30 %
Lymphs Abs: 2.7 10*3/uL (ref 0.7–4.0)
MCH: 36 pg — AB (ref 26.0–34.0)
MCH: 36.3 pg — AB (ref 26.0–34.0)
MCHC: 36.5 g/dL — ABNORMAL HIGH (ref 30.0–36.0)
MCHC: 37 g/dL — AB (ref 30.0–36.0)
MCV: 98.6 fL (ref 78.0–100.0)
MCV: 98.2 fL (ref 78.0–100.0)
MONO ABS: 1.3 10*3/uL — AB (ref 0.1–1.0)
MONO ABS: 1.5 10*3/uL — AB (ref 0.1–1.0)
MONOS PCT: 16 %
Monocytes Relative: 17 %
NEUTROS ABS: 4.6 10*3/uL (ref 1.7–7.7)
Neutro Abs: 4.7 10*3/uL (ref 1.7–7.7)
Neutrophils Relative %: 57 %
Neutrophils Relative %: 52 %
Platelets: ADEQUATE 10*3/uL (ref 150–400)
Platelets: ADEQUATE 10*3/uL (ref 150–400)
RBC: 2.22 MIL/uL — ABNORMAL LOW (ref 3.87–5.11)
RBC: 2.23 MIL/uL — ABNORMAL LOW (ref 3.87–5.11)
RDW: 19.8 % — ABNORMAL HIGH (ref 11.5–15.5)
RDW: 20 % — AB (ref 11.5–15.5)
SMEAR REVIEW: ADEQUATE
WBC: 7.9 10*3/uL (ref 4.0–10.5)
WBC: 9.1 10*3/uL (ref 4.0–10.5)

## 2016-01-11 LAB — MAGNESIUM: MAGNESIUM: 1.8 mg/dL (ref 1.7–2.4)

## 2016-01-11 LAB — I-STAT TROPONIN, ED: Troponin i, poc: 0 ng/mL (ref 0.00–0.08)

## 2016-01-11 LAB — LACTATE DEHYDROGENASE: LDH: 448 U/L — ABNORMAL HIGH (ref 98–192)

## 2016-01-11 MED ORDER — VITAMIN D (ERGOCALCIFEROL) 1.25 MG (50000 UNIT) PO CAPS
50000.0000 [IU] | ORAL_CAPSULE | ORAL | Status: DC
  Administered 2016-01-15: 50000 [IU] via ORAL
  Filled 2016-01-11: qty 1

## 2016-01-11 MED ORDER — DIPHENHYDRAMINE HCL 12.5 MG/5ML PO ELIX: 12.5000 mg | ORAL_SOLUTION | Freq: Four times a day (QID) | ORAL | Status: DC | PRN

## 2016-01-11 MED ORDER — HYDROXYUREA 500 MG PO CAPS
1000.0000 mg | ORAL_CAPSULE | Freq: Every day | ORAL | Status: DC
  Administered 2016-01-12: 500 mg via ORAL
  Administered 2016-01-13 – 2016-01-15 (×3): 1000 mg via ORAL
  Filled 2016-01-11 (×4): qty 2

## 2016-01-11 MED ORDER — ACETAMINOPHEN 325 MG PO TABS
650.0000 mg | ORAL_TABLET | ORAL | Status: DC | PRN
  Administered 2016-01-12: 650 mg via ORAL
  Filled 2016-01-11: qty 2

## 2016-01-11 MED ORDER — MORPHINE SULFATE ER 15 MG PO TBCR
15.0000 mg | EXTENDED_RELEASE_TABLET | Freq: Two times a day (BID) | ORAL | Status: DC
  Administered 2016-01-11 – 2016-01-15 (×8): 15 mg via ORAL
  Filled 2016-01-11 (×8): qty 1

## 2016-01-11 MED ORDER — FOLIC ACID 1 MG PO TABS: 1.0000 mg | ORAL_TABLET | Freq: Every day | ORAL | Status: DC

## 2016-01-11 MED ORDER — HYDROMORPHONE HCL 2 MG/ML IJ SOLN
2.0000 mg | Freq: Once | INTRAMUSCULAR | Status: AC
  Administered 2016-01-11: 2 mg via INTRAVENOUS
  Filled 2016-01-11: qty 1

## 2016-01-11 MED ORDER — SENNOSIDES-DOCUSATE SODIUM 8.6-50 MG PO TABS
1.0000 | ORAL_TABLET | Freq: Two times a day (BID) | ORAL | Status: DC
  Administered 2016-01-11 – 2016-01-15 (×8): 1 via ORAL
  Filled 2016-01-11 (×8): qty 1

## 2016-01-11 MED ORDER — ONDANSETRON HCL 4 MG/2ML IJ SOLN
4.0000 mg | Freq: Once | INTRAMUSCULAR | Status: AC
  Administered 2016-01-11: 4 mg via INTRAVENOUS
  Filled 2016-01-11: qty 2

## 2016-01-11 MED ORDER — ENOXAPARIN SODIUM 40 MG/0.4ML ~~LOC~~ SOLN
40.0000 mg | SUBCUTANEOUS | Status: DC
  Administered 2016-01-11 – 2016-01-14 (×4): 40 mg via SUBCUTANEOUS
  Filled 2016-01-11 (×4): qty 0.4

## 2016-01-11 MED ORDER — PROCHLORPERAZINE EDISYLATE 5 MG/ML IJ SOLN
10.0000 mg | Freq: Once | INTRAMUSCULAR | Status: AC
  Administered 2016-01-11: 10 mg via INTRAVENOUS
  Filled 2016-01-11: qty 2

## 2016-01-11 MED ORDER — GABAPENTIN 100 MG PO CAPS
100.0000 mg | ORAL_CAPSULE | Freq: Two times a day (BID) | ORAL | Status: DC
  Administered 2016-01-11 – 2016-01-15 (×8): 100 mg via ORAL
  Filled 2016-01-11 (×8): qty 1

## 2016-01-11 MED ORDER — FAMOTIDINE IN NACL 20-0.9 MG/50ML-% IV SOLN
20.0000 mg | Freq: Two times a day (BID) | INTRAVENOUS | Status: DC
  Administered 2016-01-11 – 2016-01-12 (×2): 20 mg via INTRAVENOUS
  Filled 2016-01-11 (×11): qty 50

## 2016-01-11 MED ORDER — CYCLOBENZAPRINE HCL 10 MG PO TABS: 10.0000 mg | ORAL_TABLET | Freq: Three times a day (TID) | ORAL | Status: DC | PRN

## 2016-01-11 MED ORDER — ONDANSETRON HCL 4 MG/2ML IJ SOLN
4.0000 mg | Freq: Four times a day (QID) | INTRAMUSCULAR | Status: DC | PRN
  Administered 2016-01-11 – 2016-01-12 (×2): 4 mg via INTRAVENOUS
  Filled 2016-01-11 (×2): qty 2

## 2016-01-11 MED ORDER — KETOROLAC TROMETHAMINE 30 MG/ML IJ SOLN
30.0000 mg | Freq: Four times a day (QID) | INTRAMUSCULAR | Status: DC
  Administered 2016-01-11 – 2016-01-15 (×15): 30 mg via INTRAVENOUS
  Filled 2016-01-11 (×15): qty 1

## 2016-01-11 MED ORDER — SODIUM CHLORIDE 0.45 % IV SOLN
INTRAVENOUS | Status: DC
  Administered 2016-01-11 – 2016-01-15 (×8): via INTRAVENOUS

## 2016-01-11 MED ORDER — PROMETHAZINE HCL 25 MG RE SUPP: 12.5000 mg | RECTAL | Status: DC | PRN

## 2016-01-11 MED ORDER — HYDROMORPHONE 1 MG/ML IV SOLN
INTRAVENOUS | Status: DC
  Administered 2016-01-11 – 2016-01-13 (×3): via INTRAVENOUS
  Administered 2016-01-13: 2.1 mg via INTRAVENOUS
  Administered 2016-01-14 (×2): 0.6 mg via INTRAVENOUS
  Filled 2016-01-11 (×3): qty 25

## 2016-01-11 MED ORDER — SODIUM CHLORIDE 0.9 % IV BOLUS (SEPSIS)
500.0000 mL | Freq: Once | INTRAVENOUS | Status: AC
  Administered 2016-01-11: 500 mL via INTRAVENOUS

## 2016-01-11 MED ORDER — HYDROMORPHONE HCL 1 MG/ML IJ SOLN
2.0000 mg | Freq: Once | INTRAMUSCULAR | Status: AC
  Administered 2016-01-11: 2 mg via INTRAVENOUS

## 2016-01-11 MED ORDER — OXYCODONE HCL 5 MG PO TABS
10.0000 mg | ORAL_TABLET | Freq: Four times a day (QID) | ORAL | Status: DC | PRN
  Administered 2016-01-14: 10 mg via ORAL
  Filled 2016-01-11 (×3): qty 2

## 2016-01-11 MED ORDER — FOLIC ACID 1 MG PO TABS
1.0000 mg | ORAL_TABLET | Freq: Every day | ORAL | Status: DC
  Administered 2016-01-12 – 2016-01-15 (×4): 1 mg via ORAL
  Filled 2016-01-11 (×4): qty 1

## 2016-01-11 MED ORDER — PROMETHAZINE HCL 12.5 MG PO TABS
12.5000 mg | ORAL_TABLET | ORAL | Status: DC | PRN
  Filled 2016-01-11: qty 2

## 2016-01-11 MED ORDER — HYDROMORPHONE HCL 1 MG/ML IJ SOLN
1.0000 mg | Freq: Once | INTRAMUSCULAR | Status: AC
  Administered 2016-01-11: 1 mg via INTRAVENOUS
  Filled 2016-01-11: qty 1

## 2016-01-11 MED ORDER — LORATADINE 10 MG PO TABS
10.0000 mg | ORAL_TABLET | Freq: Every day | ORAL | Status: DC
  Administered 2016-01-12 – 2016-01-15 (×4): 10 mg via ORAL
  Filled 2016-01-11 (×4): qty 1

## 2016-01-11 MED ORDER — HYDROMORPHONE HCL 1 MG/ML IJ SOLN
2.0000 mg | Freq: Once | INTRAMUSCULAR | Status: AC
  Administered 2016-01-11: 2 mg via INTRAVENOUS
  Filled 2016-01-11: qty 2

## 2016-01-11 MED ORDER — HYDROXYZINE HCL 10 MG PO TABS: 10.0000 mg | ORAL_TABLET | Freq: Three times a day (TID) | ORAL | Status: DC | PRN

## 2016-01-11 MED ORDER — DIPHENHYDRAMINE HCL 50 MG/ML IJ SOLN: 12.5000 mg | Freq: Four times a day (QID) | INTRAMUSCULAR | Status: DC | PRN

## 2016-01-11 MED ORDER — SODIUM CHLORIDE 0.9% FLUSH: 9.0000 mL | INTRAVENOUS | Status: DC | PRN

## 2016-01-11 MED ORDER — NALOXONE HCL 0.4 MG/ML IJ SOLN: 0.4000 mg | INTRAMUSCULAR | Status: DC | PRN

## 2016-01-11 MED ORDER — ACETAMINOPHEN 650 MG RE SUPP: 650.0000 mg | RECTAL | Status: DC | PRN

## 2016-01-11 MED ORDER — ADULT MULTIVITAMIN W/MINERALS CH
1.0000 | ORAL_TABLET | Freq: Every day | ORAL | Status: DC
  Administered 2016-01-12 – 2016-01-15 (×4): 1 via ORAL
  Filled 2016-01-11 (×4): qty 1

## 2016-01-11 MED ORDER — FAMOTIDINE 20 MG PO TABS
20.0000 mg | ORAL_TABLET | Freq: Two times a day (BID) | ORAL | Status: DC
  Administered 2016-01-11 – 2016-01-15 (×7): 20 mg via ORAL
  Filled 2016-01-11 (×8): qty 1

## 2016-01-11 MED ORDER — POLYETHYLENE GLYCOL 3350 17 G PO PACK: 17.0000 g | PACK | Freq: Every day | ORAL | Status: DC | PRN

## 2016-01-11 NOTE — H&P (Signed)
[facility]          History and Physical    PCP:   Angelica Chessman, MD   EDP: [name], M.D.  Chief Complaint:  Chest and arm pain  HPI: Patient is a pleasant 35 year old woman well-known to [place] for frequent admissions due to sickle cell anemia and crises. She states that this pain began about 3 days ago. She came to the emergency department yesterday and after several doses of IV narcotics he was able to get her pain level down to a 3 and was discharged home. She returns today with intractable pain. After receiving several doses of IV pain medications her pain remains uncontrolled and have been asked to admit her for further evaluation and management. She is not hypoxemic, does not have any infiltrates on chest x-ray. Hemoglobin is stable at 8.  Allergies:   Allergies  Allergen Reactions  . Desferal [Deferoxamine] Hives    Local reaction on arm only during infusion. Can take with benadryl   . Latex Other (See Comments)    REACTION: Pt experiences a burning sensation on contacted skin areas  . Lisinopril Other (See Comments) and Cough    REACTION: Sore/scratchy throat  . Tape Other (See Comments)    REACTION: Pt. Experiences a burning sensation on contacted skin areas      Past Medical History  Diagnosis Date  . Sickle cell disease ([place])   . Sickle cell disease, type S ([place])   . Blood transfusion   . 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 HCAP (healthcare-associated pneumonia) 05/19/2013  . Sickle cell anemia (HCC)   . 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 Vitamin B12 deficiency 07/07/2015    Past Surgical History  Procedure Laterality Date  . Cholecystectomy    .  left knee acl reconstruction    . Cesarean section      x 2  . Portacath placement    . Portacath placement Left 02/23/2013    Procedure: INSERTION PORT-A-CATH;  Surgeon: Donato Heinz, MD;  Location: [facility];  Service: General;  Laterality: Left;  . Port-a-cath  removal Left 07/29/2013    Procedure: REMOVAL PORT-A-CATH;  Surgeon: Scherry Ran, MD;  Location: [facility];  Service: General;  Laterality: Left;  . Tee without cardioversion N/A 08/07/2013    Procedure: TRANSESOPHAGEAL ECHOCARDIOGRAM (TEE);  Surgeon: Arnoldo Lenis, MD;  Location: [facility];  Service: Cardiology;  Laterality: N/A;    Prior to Admission medications   Medication Sig Start Date End Date Taking? Authorizing Provider  acetaminophen (TYLENOL) 500 MG tablet Take 500 mg by mouth every 6 (six) hours as needed.   Yes Historical Provider, MD  folic acid (FOLVITE) 1 MG tablet Take 1 tablet (1 mg total) by mouth daily. 08/16/15  Yes [name], FNP  hydroxyurea (HYDREA) 500 MG capsule Take 2 capsules (1,000 mg total) by mouth daily. May take with food to minimize GI side effects. 09/05/15  Yes [name], FNP  hydrOXYzine (ATARAX/VISTARIL) 10 MG tablet Take 1 tablet (10 mg total) by mouth 3 (three) times daily as needed for itching. 12/14/15  Yes [name], MD  ibuprofen (ADVIL,MOTRIN) 200 MG tablet Take 200 mg by mouth every 6 (six) hours as needed for mild pain or moderate pain.   Yes Historical Provider, MD  levonorgestrel (MIRENA) 20 MCG/24HR IUD 1 each by Intrauterine route once.   Yes Historical Provider, MD  loratadine (CLARITIN) 10 MG tablet Take 10 mg by mouth daily.   Yes Historical Provider, MD  morphine (MS CONTIN) 15 MG 12 hr tablet Take 1 tablet (15 mg total) by mouth every 12 (twelve) hours. 10/06/15  Yes Massie Maroon, FNP  Multiple Vitamin (MULTIVITAMIN WITH MINERALS) TABS tablet Take 1 tablet by mouth daily.   Yes Historical Provider, MD  Oxycodone HCl 10 MG TABS Take 1 tablet (10 mg total) by mouth every 6 (six) hours as needed. Patient taking differently: Take 10 mg by mouth every 6 (six) hours as needed (breakthrough pain).  10/06/15  Yes Massie Maroon, FNP  promethazine (PHENERGAN) 25 MG tablet Take 1 tablet (25 mg total) by mouth every 6 (six)  hours as needed for nausea or vomiting. 12/07/15  Yes Bethann Berkshire, MD  Vitamin D, Ergocalciferol, (DRISDOL) 50000 UNITS CAPS capsule Take 1 capsule (50,000 Units total) by mouth every 7 (seven) days. Takes on Sundays. 08/16/15  Yes Massie Maroon, FNP  cyclobenzaprine (FLEXERIL) 10 MG tablet Take 1 tablet (10 mg total) by mouth 3 (three) times daily as needed for muscle spasms. Patient not taking: Reported on 12/31/2015 12/14/15   Houston Siren, MD    Social History:  reports that she has never smoked. She has never used smokeless tobacco. She reports that she does not drink alcohol or use illicit drugs.  Family History  Problem Relation Age of Onset  . Sickle cell trait Mother   . Sickle cell trait Father   . Hypertension Father   . Diabetes Father   . Sickle cell trait Sister   . Cancer Paternal Aunt     Breast    Review of Systems:  Constitutional: Denies fever, chills, diaphoresis, appetite change and fatigue.  HEENT: Denies photophobia, eye pain, redness, hearing loss, ear pain, congestion, sore throat, rhinorrhea, sneezing, mouth sores, trouble swallowing, neck pain, neck stiffness and tinnitus.   Respiratory: Denies SOB, DOE, cough, chest tightness,  and wheezing.   Cardiovascular: Denies chest pain, palpitations and leg swelling.  Gastrointestinal: Denies nausea, vomiting, abdominal pain, diarrhea, constipation, blood in stool and abdominal distention.  Genitourinary: Denies dysuria, urgency, frequency, hematuria, flank pain and difficulty urinating.  Endocrine: Denies: hot or cold intolerance, sweats, changes in hair or nails, polyuria, polydipsia. Musculoskeletal: Denies  back pain, joint swelling, arthralgias and gait problem.  Skin: Denies pallor, rash and wound.  Neurological: Denies dizziness, seizures, syncope, weakness, light-headedness, numbness and headaches.  Hematological: Denies adenopathy. Easy bruising, personal or family bleeding history  Psychiatric/Behavioral:  Denies suicidal ideation, mood changes, confusion, nervousness, sleep disturbance and agitation   Physical Exam: Blood pressure 134/67, pulse 85, temperature 98.1 F (36.7 C), temperature source Oral, resp. rate 17, height 5\' 3"  (1.6 m), SpO2 97 %. Gen.: Alert, awake, oriented 3 HEENT: Normocephalic, atraumatic, pupils equal round and reactive to light, extraocular movements intact, wears corrective lenses Neck: Supple, no JVD, no lymphadenopathy, no bruits, no goiter Cardiovascular: Regular rate and rhythm, no murmurs, rubs or gallops Lungs: Clear to auscultation bilaterally Abdomen: Soft, nontender, nondistended, positive bowel sounds Extremities: No clubbing, cyanosis or edema, positive pulses Neurologic: Grossly intact and nonfocal  Labs on Admission:  Results for orders placed or performed during the hospital encounter of 01/11/16 (from the past 48 hour(s))  CBC with Differential/Platelet     Status: Abnormal   Collection Time: 01/11/16  7:45 AM  Result Value Ref Range   WBC 7.9 4.0 - 10.5 K/uL   RBC 2.22 (L) 3.87 -  5.11 MIL/uL   Hemoglobin 8.0 (L) 12.0 - 15.0 g/dL   HCT 21.9 (L) 36.0 - 46.0 %   MCV 98.6 78.0 - 100.0 fL   MCH 36.0 (H) 26.0 - 34.0 pg   MCHC 36.5 (H) 30.0 - 36.0 g/dL   RDW 19.8 (H) 11.5 - 15.5 %   Platelets  150 - 400 K/uL    PLATELET CLUMPS NOTED ON SMEAR, COUNT APPEARS ADEQUATE   Neutrophils Relative % 57 %   Neutro Abs 4.6 1.7 - 7.7 K/uL   Lymphocytes Relative 24 %   Lymphs Abs 1.9 0.7 - 4.0 K/uL   Monocytes Relative 16 %   Monocytes Absolute 1.3 (H) 0.1 - 1.0 K/uL   Eosinophils Relative 2 %   Eosinophils Absolute 0.1 0.0 - 0.7 K/uL   Basophils Relative 1 %   Basophils Absolute 0.0 0.0 - 0.1 K/uL   RBC Morphology SICKLE CELLS     Comment: TARGET CELLS   Smear Review      PLATELET CLUMPS NOTED ON SMEAR, COUNT APPEARS ADEQUATE  Comprehensive metabolic panel     Status: Abnormal   Collection Time: 01/11/16  7:45 AM  Result Value Ref Range   Sodium  137 135 - 145 mmol/L   Potassium 3.6 3.5 - 5.1 mmol/L   Chloride 106 101 - 111 mmol/L   CO2 25 22 - 32 mmol/L   Glucose, Bld 98 65 - 99 mg/dL   BUN 5 (L) 6 - 20 mg/dL   Creatinine, Ser 0.34 (L) 0.44 - 1.00 mg/dL   Calcium 8.5 (L) 8.9 - 10.3 mg/dL   Total Protein 7.9 6.5 - 8.1 g/dL   Albumin 3.0 (L) 3.5 - 5.0 g/dL   AST 98 (H) 15 - 41 U/L   ALT 33 14 - 54 U/L   Alkaline Phosphatase 134 (H) 38 - 126 U/L   Total Bilirubin 11.4 (H) 0.3 - 1.2 mg/dL   GFR calc non Af Amer >60 >60 mL/min   GFR calc Af Amer >60 >60 mL/min    Comment: (NOTE) The eGFR has been calculated using the CKD EPI equation. This calculation has not been validated in all clinical situations. eGFR's persistently <60 mL/min signify possible Chronic Kidney Disease.    Anion gap 6 5 - 15  Reticulocytes     Status: Abnormal   Collection Time: 01/11/16  7:45 AM  Result Value Ref Range   Retic Ct Pct >23.0 (H) 0.4 - 3.1 %   RBC. 2.22 (L) 3.87 - 5.11 MIL/uL   Retic Count, Manual NOT CALCULATED 19.0 - 186.0 K/uL  I-stat troponin, ED     Status: None   Collection Time: 01/11/16  8:36 AM  Result Value Ref Range   Troponin i, poc 0.00 0.00 - 0.08 ng/mL   Comment 3            Comment: Due to the release kinetics of cTnI, a negative result within the first hours of the onset of symptoms does not rule out myocardial infarction with certainty. If myocardial infarction is still suspected, repeat the test at appropriate intervals.     Radiological Exams on Admission: Dg Chest 2 View  01/11/2016  CLINICAL DATA:  35 year old female with chest pain and body aches since 1900 hours yesterday. Sickle cell disease. Initial encounter. EXAM: CHEST  2 VIEW COMPARISON:  12/09/2015 and earlier. FINDINGS: Normal lung volumes. Stable mild cardiomegaly. Other mediastinal contours are within normal limits. Visualized tracheal air column is within normal limits. Borderline to  mild pulmonary interstitial markings are stable in the lungs  otherwise are clear. No pneumothorax or effusion. Right upper quadrant surgical clips re- demonstrated. No osseous abnormality identified. IMPRESSION: No acute cardiopulmonary abnormality. Electronically Signed   By: Genevie Ann M.D.   On: 01/11/2016 09:29    Assessment/Plan Principal Problem:   Sickle cell crisis (HCC) Active Problems:   Sickle cell anemia with crisis (HCC)   Sickle cell anemia with painful vaso-occlusive crisis -No indication for acute chest syndrome given normal chest x-ray and no hypoxemia.  -We'll place on full dose Dilaudid PCA pump, scheduled Toradol and Tylenol, continue home doses of oxycodone and MS Contin. -Added Neurontin, continue folic acid and hydroxyurea. -Hemoglobin stable at a 8.0, no indication for transfusion at present.   Time Spent on Admission: 85 minutes  HERNANDEZ ACOSTA,[name] [facility] Pager: [phone] 01/11/2016, 2:00 PM

## 2016-01-11 NOTE — ED Notes (Signed)
Patient transported to X-ray 

## 2016-01-11 NOTE — ED Notes (Signed)
Pt complain of chest pain and aching all over. States pain started around 7 pm last night. Pt was evaluated here yesterday for pain.

## 2016-01-11 NOTE — ED Provider Notes (Signed)
CSN: [id number]     Arrival date & time 01/11/16  0710 History  By signing my name below, I, Terressa Koyanagi, attest that this documentation has been prepared under the direction and in the presence of [name], MD. Electronically Signed: Terressa Koyanagi, ED Scribe. 01/11/2016. 8:30 AM.  Chief Complaint  Patient presents with  . Sickle Cell Pain Crisis  Patient is a 35 y.o. female presenting with sickle cell pain. The history is provided by the patient. No language interpreter was used.  Sickle Cell Pain Crisis Location:  Back and upper extremity Severity:  Severe Duration:  13 hours Similar to previous crisis episodes: yes   Timing:  Constant Chronicity:  Chronic Associated symptoms: chest pain   Associated symptoms: no congestion, no cough, no fatigue and no headaches   Chest pain:    Quality:  Aching and sharp   Severity:  Severe   Duration:  13 hours Risk factors: frequent pain crises    PCP: Angelica Chessman, MD HPI Comments: Nandini L Eastmond is a 35 y.o. female, with a PMHx of sickle cell disease and sickle cell anemia, who presents to the Emergency Department complaining of sickle cell pain crisis onset 7pm last night. Pt complains of chest pain, bilateral arm pain, and lower back pain. Pt has been seen at the ED for sickle cell pain crisis management multiple times with the most recent visit occuring yesterday. Pt denies any abd pain.  Past Medical History  Diagnosis Date  . Sickle cell disease ([place])   . Sickle cell disease, type S ([place])   . Blood transfusion   . 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 HCAP (healthcare-associated pneumonia) 05/19/2013  . Sickle cell anemia (HCC)   . 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 Vitamin B12 deficiency 07/07/2015   Past Surgical History  Procedure Laterality Date  . Cholecystectomy    .  left knee acl reconstruction    . Cesarean section      x 2  . Portacath placement    . Portacath placement Left 02/23/2013    Procedure:  INSERTION PORT-A-CATH;  Surgeon: Donato Heinz, MD;  Location: [facility];  Service: General;  Laterality: Left;  . Port-a-cath removal Left 07/29/2013    Procedure: REMOVAL PORT-A-CATH;  Surgeon: Scherry Ran, MD;  Location: [facility];  Service: General;  Laterality: Left;  . Tee without cardioversion N/A 08/07/2013    Procedure: TRANSESOPHAGEAL ECHOCARDIOGRAM (TEE);  Surgeon: Arnoldo Lenis, MD;  [facility];  Service: Cardiology;  Laterality: N/A;   Family History  Problem Relation Age of Onset  . Sickle cell trait Mother   . Sickle cell trait Father   . Hypertension Father   . Diabetes Father   . Sickle cell trait Sister   . Cancer Paternal Aunt     Breast   Social History  Substance Use Topics  . Smoking status: Never Smoker   . Smokeless tobacco: Never Used  . Alcohol Use: No   OB History    Gravida Para Term Preterm AB TAB SAB Ectopic Multiple Living   1 1  1      1      Review of Systems  Constitutional: Negative for appetite change and fatigue.  HENT: Negative for congestion, ear discharge and sinus pressure.   Eyes: Negative for discharge.  Respiratory: Negative for cough.   Cardiovascular: Positive for chest pain.  Gastrointestinal: Negative for abdominal pain and diarrhea.  Genitourinary: Negative for frequency and hematuria.  Musculoskeletal:  Positive for arthralgias. Negative for back pain.  Skin: Negative for rash.  Neurological: Negative for seizures and headaches.  Psychiatric/Behavioral: Negative for hallucinations.   Allergies  Desferal; Latex; Lisinopril; and Tape  Home Medications   Prior to Admission medications   Medication Sig Start Date End Date Taking? Authorizing Provider  acetaminophen (TYLENOL) 500 MG tablet Take 500 mg by mouth every 6 (six) hours as needed.   Yes Historical Provider, MD  folic acid (FOLVITE) 1 MG tablet Take 1 tablet (1 mg total) by mouth daily. 08/16/15  Yes [name], FNP  hydroxyurea (HYDREA) 500  MG capsule Take 2 capsules (1,000 mg total) by mouth daily. May take with food to minimize GI side effects. 09/05/15  Yes [name], FNP  hydrOXYzine (ATARAX/VISTARIL) 10 MG tablet Take 1 tablet (10 mg total) by mouth 3 (three) times daily as needed for itching. 12/14/15  Yes [name], MD  ibuprofen (ADVIL,MOTRIN) 200 MG tablet Take 200 mg by mouth every 6 (six) hours as needed for mild pain or moderate pain.   Yes Historical Provider, MD  levonorgestrel (MIRENA) 20 MCG/24HR IUD 1 each by Intrauterine route once.   Yes Historical Provider, MD  loratadine (CLARITIN) 10 MG tablet Take 10 mg by mouth daily.   Yes Historical Provider, MD  morphine (MS CONTIN) 15 MG 12 hr tablet Take 1 tablet (15 mg total) by mouth every 12 (twelve) hours. 10/06/15  Yes [name], FNP  Multiple Vitamin (MULTIVITAMIN WITH MINERALS) TABS tablet Take 1 tablet by mouth daily.   Yes Historical Provider, MD  Oxycodone HCl 10 MG TABS Take 1 tablet (10 mg total) by mouth every 6 (six) hours as needed. Patient taking differently: Take 10 mg by mouth every 6 (six) hours as needed (breakthrough pain).  10/06/15  Yes [name], FNP  promethazine (PHENERGAN) 25 MG tablet Take 1 tablet (25 mg total) by mouth every 6 (six) hours as needed for nausea or vomiting. 12/07/15  Yes [name], MD  Vitamin D, Ergocalciferol, (DRISDOL) 50000 UNITS CAPS capsule Take 1 capsule (50,000 Units total) by mouth every 7 (seven) days. Takes on Sundays. 08/16/15  Yes [name], FNP  cyclobenzaprine (FLEXERIL) 10 MG tablet Take 1 tablet (10 mg total) by mouth 3 (three) times daily as needed for muscle spasms. Patient not taking: Reported on 12/31/2015 12/14/15   [name], MD   Triage Vitals: BP 124/93 mmHg  Pulse 89  Temp(Src) 98.1 F (36.7 C) (Oral)  Resp 14  Ht 5\' 3"  (1.6 m)  SpO2 98% Physical Exam  Constitutional: She is oriented to person, place, and time. She appears well-developed. She appears distressed.  Appears very  uncomfortable   HENT:  Head: Normocephalic.  Eyes: Conjunctivae and EOM are normal. No scleral icterus.  Neck: Neck supple. No thyromegaly present.  Cardiovascular: Normal rate and regular rhythm.  Exam reveals no gallop and no friction rub.   No murmur heard. Pulmonary/Chest: No stridor. She has no wheezes. She has no rales. She exhibits no tenderness.  Abdominal: She exhibits no distension. There is no tenderness. There is no rebound.  Musculoskeletal: Normal range of motion. She exhibits no edema.  Lymphadenopathy:    She has no cervical adenopathy.  Neurological: She is oriented to person, place, and time. She exhibits normal muscle tone. Coordination normal.  Skin: No rash noted. No erythema.  Psychiatric: She has a normal mood and affect. Her behavior is normal.    ED Course  Procedures (including critical care time) DIAGNOSTIC STUDIES: Oxygen Saturation is 98% on ra, nl by my interpretation.    COORDINATION OF CARE: 8:26 AM: Discussed treatment plan which includes meds and labs with pt at bedside; patient verbalizes understanding and agrees with treatment plan.  Labs Review Labs Reviewed - No data to display  I have personally reviewed and evaluated these lab results as part of my medical decision-making.   EKG Interpretation None      MDM   Final diagnoses:  None   Patient with sickle cell crisis. Pain not relieved with pain medicines from today. Also patient was seen in emergency room yesterday with pain treated and sent home. She will be admitted to [facility] for pain control and sickle cell crisis  The chart was scribed for me under my direct supervision.  I personally performed the history, physical, and medical decision making and all procedures in the evaluation of this patient.[name], MD 01/11/16 1215

## 2016-01-12 ENCOUNTER — Inpatient Hospital Stay (HOSPITAL_COMMUNITY): Payer: BLUE CROSS/BLUE SHIELD

## 2016-01-12 MED ORDER — SODIUM CHLORIDE 0.9 % IV BOLUS (SEPSIS)
500.0000 mL | Freq: Once | INTRAVENOUS | Status: AC
  Administered 2016-01-12: 500 mL via INTRAVENOUS

## 2016-01-12 MED ORDER — PROCHLORPERAZINE EDISYLATE 5 MG/ML IJ SOLN
10.0000 mg | Freq: Four times a day (QID) | INTRAMUSCULAR | Status: DC | PRN
  Administered 2016-01-12 – 2016-01-15 (×8): 10 mg via INTRAVENOUS
  Filled 2016-01-12 (×8): qty 2

## 2016-01-12 NOTE — Progress Notes (Signed)
[facility] PROGRESS NOTE  Ica Holford Hagy [id number] DOB: 08-05-1981 DOA: 01/11/2016 PCP: Angelica Chessman, MD  Assessment/Plan: Sickle cell anemia with painful vaso-occlusive crisis -Patient's pain is remarkably improved. -Continue full dose Dilaudid PCA, scheduled Tylenol and Toradol, Neurontin. -Also will continue home doses of MS Contin and oxycodone. -Continue multivitamin, folic acid, hydroxyurea. -Zofran/Phenergan for nausea.  Code Status: Full code Family Communication: Patient only  Disposition Plan: Hopeful for discharge home in approximately 4-6 hours   Consultants:  None   Antibiotics:  None   Subjective: Feels much improved, rates pain at a 5-6/10.  Objective: Filed Vitals:   01/11/16 2132 01/12/16 0059 01/12/16 0400 01/12/16 0500  BP:    139/81  Pulse:    94  Temp:    98.4 F (36.9 C)  TempSrc:    Oral  Resp: 18 15 15 16   Height:      Weight:      SpO2: 100% 100% 100% 100%    Intake/Output Summary (Last 24 hours) at 01/12/16 1134 Last data filed at 01/11/16 1810  Gross per 24 hour  Intake    240 ml  Output      0 ml  Net    240 ml   Filed Weights   01/11/16 1433  Weight: 73.755 kg (162 lb 9.6 oz)    Exam:   General:  Alert, awake, oriented 3  Cardiovascular: Regular rate and rhythm  Respiratory: Clear to auscultation bilaterally  Abdomen: Soft, nontender, nondistended, positive bowel sounds  Extremities: No clubbing, cyanosis or edema, positive pulses   Neurologic:  Grossly intact and nonfocal  Data Reviewed: Basic Metabolic Panel:  Recent Labs Lab 01/10/16 1206 01/11/16 0745 01/11/16 1504  NA 139 137 139  K 3.6 3.6 3.7  CL 106 106 109  CO2 23 25 22   GLUCOSE 101* 98 97  BUN 5* 5* <5*  CREATININE 0.41* 0.34* 0.38*  CALCIUM 8.4* 8.5* 8.5*  MG  --   --  1.8   Liver Function Tests:  Recent Labs Lab 01/10/16 1206 01/11/16 0745 01/11/16 1504  AST 94* 98* 94*  ALT 33 33 34  ALKPHOS 134* 134* 134*    BILITOT 12.3* 11.4* 11.0*  PROT 8.0 7.9 7.8  ALBUMIN 3.1* 3.0* 3.1*   No results for input(s): LIPASE, AMYLASE in the last 168 hours. No results for input(s): AMMONIA in the last 168 hours. CBC:  Recent Labs Lab 01/10/16 1206 01/11/16 0745 01/11/16 1504  WBC 8.7 7.9 9.1  NEUTROABS 4.6 4.6 4.7  HGB 8.2* 8.0* 8.1*  HCT 22.6* 21.9* 21.9*  MCV 98.7 98.6 98.2  PLT PLATELET CLUMPS NOTED ON SMEAR, COUNT APPEARS ADEQUATE PLATELET CLUMPS NOTED ON SMEAR, COUNT APPEARS ADEQUATE PLATELET CLUMPS NOTED ON SMEAR, COUNT APPEARS ADEQUATE   Cardiac Enzymes: No results for input(s): CKTOTAL, CKMB, CKMBINDEX, TROPONINI in the last 168 hours. BNP (last 3 results) No results for input(s): BNP in the last 8760 hours.  ProBNP (last 3 results) No results for input(s): PROBNP in the last 8760 hours.  CBG: No results for input(s): GLUCAP in the last 168 hours.  No results found for this or any previous visit (from the past 240 hour(s)).   Studies: Dg Chest 2 View  01/11/2016  CLINICAL DATA:  35 year old female with chest pain and body aches since 1900 hours yesterday. Sickle cell disease. Initial encounter. EXAM: CHEST  2 VIEW COMPARISON:  12/09/2015 and earlier. FINDINGS: Normal lung volumes. Stable mild cardiomegaly. Other mediastinal contours are within normal  limits. Visualized tracheal air column is within normal limits. Borderline to mild pulmonary interstitial markings are stable in the lungs otherwise are clear. No pneumothorax or effusion. Right upper quadrant surgical clips re- demonstrated. No osseous abnormality identified. IMPRESSION: No acute cardiopulmonary abnormality. Electronically Signed   By: Genevie Ann M.D.   On: 01/11/2016 09:29    Scheduled Meds: . enoxaparin (LOVENOX) injection  40 mg Subcutaneous Q24H  . famotidine  20 mg Oral Q12H   Or  . famotidine (PEPCID) IV  20 mg Intravenous Q12H  . folic acid  1 mg Oral Daily  . gabapentin  100 mg Oral BID  . HYDROmorphone    Intravenous Q4H  . hydroxyurea  1,000 mg Oral Daily  . ketorolac  30 mg Intravenous Q6H  . loratadine  10 mg Oral Daily  . morphine  15 mg Oral Q12H  . multivitamin with minerals  1 tablet Oral Daily  . senna-docusate  1 tablet Oral BID  . [START ON 01/15/2016] Vitamin D (Ergocalciferol)  50,000 Units Oral Q Sun   Continuous Infusions: . sodium chloride 100 mL/hr at 01/12/16 0059    Principal Problem:   Sickle cell crisis (HCC) Active Problems:   Sickle cell anemia with crisis ([place])    Time spent: 25 minutes. Greater than 50% of this time was spent in direct contact with the patient coordinating care.    Lelon Frohlich  [facility] Pager [phone]  If 7PM-7AM, please contact night-coverage at [email or web address], password [facility] 01/12/2016, 11:34 AM  LOS: 1 day

## 2016-01-12 NOTE — Progress Notes (Signed)
Pt's bp 156/101, 100.9 heart rate of 138. Prn 650mg  tylenol given, provider notified. Vs reassessed bp 142/81, 99.9 heart rate 116.

## 2016-01-13 NOTE — Progress Notes (Signed)
[facility] PROGRESS NOTE  Dawn [name] [id number] DOB: Feb 16, 1981 DOA: 01/11/2016 PCP: Angelica Chessman, MD  Assessment/Plan: Sickle cell anemia with painful vaso-occlusive crisis -Patient's pain is improved. -Continue full dose Dilaudid PCA, scheduled Tylenol and Toradol, Neurontin. -Also will continue home doses of MS Contin and oxycodone. -Continue multivitamin, folic acid, hydroxyurea. -Zofran/Phenergan for nausea.  Code Status: Full code Family Communication: Patient only  Disposition Plan: Hopeful for discharge home in approximately 48-72 hours   Consultants:  None   Antibiotics:  None   Subjective: Feels much improved, rates pain at a 5-6/10.  Objective: Filed Vitals:   01/13/16 0400 01/13/16 0630 01/13/16 0800 01/13/16 0945  BP:  155/95    Pulse:  111    Temp:  98.8 F (37.1 C)    TempSrc:  Oral    Resp: 18 18 20 18   Height:      Weight:      SpO2: 100% 100% 100%     Intake/Output Summary (Last 24 hours) at 01/13/16 1333 Last data filed at 01/12/16 1425  Gross per 24 hour  Intake    240 ml  Output      0 ml  Net    240 ml   Filed Weights   01/11/16 1433  Weight: 73.755 kg (162 lb 9.6 oz)    Exam:   General:  Alert, awake, oriented 3  Cardiovascular: Regular rate and rhythm  Respiratory: Clear to auscultation bilaterally  Abdomen: Soft, nontender, nondistended, positive bowel sounds  Extremities: No clubbing, cyanosis or edema, positive pulses   Neurologic:  Grossly intact and nonfocal  Data Reviewed: Basic Metabolic Panel:  Recent Labs Lab 01/10/16 1206 01/11/16 0745 01/11/16 1504  NA 139 137 139  K 3.6 3.6 3.7  CL 106 106 109  CO2 23 25 22   GLUCOSE 101* 98 97  BUN 5* 5* <5*  CREATININE 0.41* 0.34* 0.38*  CALCIUM 8.4* 8.5* 8.5*  MG  --   --  1.8   Liver Function Tests:  Recent Labs Lab 01/10/16 1206 01/11/16 0745 01/11/16 1504  AST 94* 98* 94*  ALT 33 33 34  ALKPHOS 134* 134* 134*  BILITOT  12.3* 11.4* 11.0*  PROT 8.0 7.9 7.8  ALBUMIN 3.1* 3.0* 3.1*   No results for input(s): LIPASE, AMYLASE in the last 168 hours. No results for input(s): AMMONIA in the last 168 hours. CBC:  Recent Labs Lab 01/10/16 1206 01/11/16 0745 01/11/16 1504  WBC 8.7 7.9 9.1  NEUTROABS 4.6 4.6 4.7  HGB 8.2* 8.0* 8.1*  HCT 22.6* 21.9* 21.9*  MCV 98.7 98.6 98.2  PLT PLATELET CLUMPS NOTED ON SMEAR, COUNT APPEARS ADEQUATE PLATELET CLUMPS NOTED ON SMEAR, COUNT APPEARS ADEQUATE PLATELET CLUMPS NOTED ON SMEAR, COUNT APPEARS ADEQUATE   Cardiac Enzymes: No results for input(s): CKTOTAL, CKMB, CKMBINDEX, TROPONINI in the last 168 hours. BNP (last 3 results) No results for input(s): BNP in the last 8760 hours.  ProBNP (last 3 results) No results for input(s): PROBNP in the last 8760 hours.  CBG: No results for input(s): GLUCAP in the last 168 hours.  No results found for this or any previous visit (from the past 240 hour(s)).   Studies: Dg Chest Port 1 View  01/12/2016  CLINICAL DATA:  Sickle cell anemia with left-sided chest pain and shortness of breath. Onset today. Fever. EXAM: PORTABLE CHEST 1 VIEW COMPARISON:  Frontal and lateral views yesterday. FINDINGS: Lower lung volumes from prior exam likely accentuating the cardiac size. Mild cardiomegaly is  likely unchanged allowing for differences in technique. Mildly increased interstitial markings is grossly stable. No focal airspace disease. No pleural effusion or pneumothorax. Avascular necrosis of both humeral heads. IMPRESSION: Hypoventilatory chest with grossly stable cardiomegaly and interstitial prominence. No focal opacity. Electronically Signed   By: Jeb Levering M.D.   On: 01/12/2016 22:10    Scheduled Meds: . enoxaparin (LOVENOX) injection  40 mg Subcutaneous Q24H  . famotidine  20 mg Oral Q12H   Or  . famotidine (PEPCID) IV  20 mg Intravenous Q12H  . folic acid  1 mg Oral Daily  . gabapentin  100 mg Oral BID  . HYDROmorphone    Intravenous Q4H  . hydroxyurea  1,000 mg Oral Daily  . ketorolac  30 mg Intravenous Q6H  . loratadine  10 mg Oral Daily  . morphine  15 mg Oral Q12H  . multivitamin with minerals  1 tablet Oral Daily  . senna-docusate  1 tablet Oral BID  . [START ON 01/15/2016] Vitamin D (Ergocalciferol)  50,000 Units Oral Q Sun   Continuous Infusions: . sodium chloride 100 mL/hr at 01/13/16 0944    Principal Problem:   Sickle cell crisis (HCC) Active Problems:   Sickle cell anemia with crisis ([place])    Time spent: 25 minutes. Greater than 50% of this time was spent in direct contact with the patient coordinating care.    Lelon Frohlich  [facility] Pager [phone]  If 7PM-7AM, please contact night-coverage at [email or web address], password [facility] 01/13/2016, 1:33 PM  LOS: 2 days

## 2016-01-13 NOTE — Care Management Note (Signed)
Case Management Note  Patient Details  Name: NIMRAT KNOBLAUCH MRN: [id number] Date of Birth: November 05, 1980  Subjective/Objective:    Spoke with patient who is alert and oriented from home. Stated that they can afford medications and are able to make thier medical appointments without difficulty. No DME or O2 at home. No CM needs identified.               Action/Plan:Home with self care.   Expected Discharge Date:  01/13/16               Expected Discharge Plan:  Home/Self Care  In-House Referral:     Discharge planning Services  CM Consult  Post Acute Care Choice:    Choice offered to:     DME Arranged:    DME Agency:     HH Arranged:    HH Agency:     Status of Service:  Completed, signed off  Medicare Important Message Given:    Date Medicare IM Given:    Medicare IM give by:    Date Additional Medicare IM Given:    Additional Medicare Important Message give by:     If discussed at [place] of Stay Meetings, dates discussed:    Additional Comments:  Alvie Heidelberg, RN 01/13/2016, 3:34 PM

## 2016-01-14 NOTE — Progress Notes (Signed)
[facility] PROGRESS NOTE  Dawn [name] [id number] DOB: 08/27/1981 DOA: 01/11/2016 PCP: Angelica Chessman, MD  Assessment/Plan: Sickle cell anemia with painful vaso-occlusive crisis -Patient's pain is improved. -We'll discontinue Dilaudid PCA today and observe in the hospital another 24 hours. -Continue scheduled Tylenol and Toradol, Neurontin. -Also will continue home doses of MS Contin and oxycodone. -Continue multivitamin, folic acid, hydroxyurea. -Zofran/Phenergan for nausea.  Code Status: Full code Family Communication: Patient only  Disposition Plan: Hopeful for discharge home in approximately 24-48 hours   Consultants:  None   Antibiotics:  None   Subjective: Feels much improved, rates pain at a 3-4/10.  Objective: Filed Vitals:   01/14/16 0400 01/14/16 0606 01/14/16 0800 01/14/16 1200  BP:  126/60    Pulse:  106    Temp:  98.8 F (37.1 C)    TempSrc:  Oral    Resp: 20 20 11 22   Height:      Weight:      SpO2: 100% 100% 100% 100%    Intake/Output Summary (Last 24 hours) at 01/14/16 1516 Last data filed at 01/13/16 1824  Gross per 24 hour  Intake    240 ml  Output      0 ml  Net    240 ml   Filed Weights   01/11/16 1433  Weight: 73.755 kg (162 lb 9.6 oz)    Exam:   General:  Alert, awake, oriented 3  Cardiovascular: Regular rate and rhythm  Respiratory: Clear to auscultation bilaterally  Abdomen: Soft, nontender, nondistended, positive bowel sounds  Extremities: No clubbing, cyanosis or edema, positive pulses   Neurologic:  Grossly intact and nonfocal  Data Reviewed: Basic Metabolic Panel:  Recent Labs Lab 01/10/16 1206 01/11/16 0745 01/11/16 1504  NA 139 137 139  K 3.6 3.6 3.7  CL 106 106 109  CO2 23 25 22   GLUCOSE 101* 98 97  BUN 5* 5* <5*  CREATININE 0.41* 0.34* 0.38*  CALCIUM 8.4* 8.5* 8.5*  MG  --   --  1.8   Liver Function Tests:  Recent Labs Lab 01/10/16 1206 01/11/16 0745 01/11/16 1504  AST  94* 98* 94*  ALT 33 33 34  ALKPHOS 134* 134* 134*  BILITOT 12.3* 11.4* 11.0*  PROT 8.0 7.9 7.8  ALBUMIN 3.1* 3.0* 3.1*   No results for input(s): LIPASE, AMYLASE in the last 168 hours. No results for input(s): AMMONIA in the last 168 hours. CBC:  Recent Labs Lab 01/10/16 1206 01/11/16 0745 01/11/16 1504  WBC 8.7 7.9 9.1  NEUTROABS 4.6 4.6 4.7  HGB 8.2* 8.0* 8.1*  HCT 22.6* 21.9* 21.9*  MCV 98.7 98.6 98.2  PLT PLATELET CLUMPS NOTED ON SMEAR, COUNT APPEARS ADEQUATE PLATELET CLUMPS NOTED ON SMEAR, COUNT APPEARS ADEQUATE PLATELET CLUMPS NOTED ON SMEAR, COUNT APPEARS ADEQUATE   Cardiac Enzymes: No results for input(s): CKTOTAL, CKMB, CKMBINDEX, TROPONINI in the last 168 hours. BNP (last 3 results) No results for input(s): BNP in the last 8760 hours.  ProBNP (last 3 results) No results for input(s): PROBNP in the last 8760 hours.  CBG: No results for input(s): GLUCAP in the last 168 hours.  No results found for this or any previous visit (from the past 240 hour(s)).   Studies: Dg Chest Port 1 View  01/12/2016  CLINICAL DATA:  Sickle cell anemia with left-sided chest pain and shortness of breath. Onset today. Fever. EXAM: PORTABLE CHEST 1 VIEW COMPARISON:  Frontal and lateral views yesterday. FINDINGS: Lower lung volumes from prior  exam likely accentuating the cardiac size. Mild cardiomegaly is likely unchanged allowing for differences in technique. Mildly increased interstitial markings is grossly stable. No focal airspace disease. No pleural effusion or pneumothorax. Avascular necrosis of both humeral heads. IMPRESSION: Hypoventilatory chest with grossly stable cardiomegaly and interstitial prominence. No focal opacity. Electronically Signed   By: Jeb Levering M.D.   On: 01/12/2016 22:10    Scheduled Meds: . enoxaparin (LOVENOX) injection  40 mg Subcutaneous Q24H  . famotidine  20 mg Oral Q12H   Or  . famotidine (PEPCID) IV  20 mg Intravenous Q12H  . folic acid  1 mg Oral  Daily  . gabapentin  100 mg Oral BID  . hydroxyurea  1,000 mg Oral Daily  . ketorolac  30 mg Intravenous Q6H  . loratadine  10 mg Oral Daily  . morphine  15 mg Oral Q12H  . multivitamin with minerals  1 tablet Oral Daily  . senna-docusate  1 tablet Oral BID  . [START ON 01/15/2016] Vitamin D (Ergocalciferol)  50,000 Units Oral Q Sun   Continuous Infusions: . sodium chloride 100 mL/hr at 01/14/16 [id number]    Principal Problem:   Sickle cell crisis (HCC) Active Problems:   Sickle cell anemia with crisis ([place])    Time spent: 25 minutes. Greater than 50% of this time was spent in direct contact with the patient coordinating care.    Lelon Frohlich  [facility] Pager [phone]  If 7PM-7AM, please contact night-coverage at [email or web address], password [facility] 01/14/2016, 3:16 PM  LOS: 3 days

## 2016-01-15 MED ORDER — POLYETHYLENE GLYCOL 3350 17 G PO PACK: 17.0000 g | PACK | Freq: Every day | ORAL | Status: DC | PRN

## 2016-01-15 NOTE — Progress Notes (Signed)
Discharged PT per MD order and protocol. Discharge handouts reviewed/explained. Education completed.  Pt verbalized understanding and left with all belongings. VSS. IV catheter D/C.  Patient wheeled down by staff member.  

## 2016-01-15 NOTE — Discharge Summary (Signed)
Physician Discharge Summary  Dawn [name] [id number] DOB: Mar 31, 1981 DOA: 01/11/2016  PCP: Angelica Chessman, MD  Admit date: 01/11/2016 Discharge date: 01/15/2016  Time spent: 45 minutes  Recommendations for Outpatient Follow-up:  -Will be discharged home today. -Advised to follow-up with primary care provider within 2 weeks.   Discharge Diagnoses:  Principal Problem:   Sickle cell crisis ([place]) Active Problems:   Sickle cell anemia with crisis [facility])   Discharge Condition: Stable and improved  Filed Weights   01/11/16 1433  Weight: 73.755 kg (162 lb 9.6 oz)    History of present illness:  Patient is a pleasant 35 year old woman well-known to [place] for frequent admissions due to sickle cell anemia and crises. She states that this pain began about 3 days ago. She came to the emergency department yesterday and after several doses of IV narcotics he was able to get her pain level down to a 3 and was discharged home. She returns today with intractable pain. After receiving several doses of IV pain medications her pain remains uncontrolled and have been asked to admit her for further evaluation and management. She is not hypoxemic, does not have any infiltrates on chest x-ray. Hemoglobin is stable at 8.   Hospital Course:   Sickle cell anemia with painful vaso-occlusive crisis -Patient's pain is improved. -Has been off Dilaudid PCA for full 24 hours without requiring when necessary dosing. -Continue scheduled Tylenol and Toradol. -Continue home doses of MS Contin and oxycodone. -Continue multivitamin, folic acid, hydroxyurea. -Zofran/Phenergan for nausea.  Procedures:  None   Consultations:  None  Discharge Instructions  Discharge Instructions    Increase activity slowly    Complete by:  As directed             Medication List    TAKE these medications        acetaminophen 500 MG tablet  Commonly known as:  TYLENOL  Take 500 mg by mouth every 6 (six) hours  as needed.     cyclobenzaprine 10 MG tablet  Commonly known as:  FLEXERIL  Take 1 tablet (10 mg total) by mouth 3 (three) times daily as needed for muscle spasms.     folic acid 1 MG tablet  Commonly known as:  FOLVITE  Take 1 tablet (1 mg total) by mouth daily.     hydroxyurea 500 MG capsule  Commonly known as:  HYDREA  Take 2 capsules (1,000 mg total) by mouth daily. May take with food to minimize GI side effects.     hydrOXYzine 10 MG tablet  Commonly known as:  ATARAX/VISTARIL  Take 1 tablet (10 mg total) by mouth 3 (three) times daily as needed for itching.     ibuprofen 200 MG tablet  Commonly known as:  ADVIL,MOTRIN  Take 200 mg by mouth every 6 (six) hours as needed for mild pain or moderate pain.     levonorgestrel 20 MCG/24HR IUD  Commonly known as:  MIRENA  1 each by Intrauterine route once.     loratadine 10 MG tablet  Commonly known as:  CLARITIN  Take 10 mg by mouth daily.     morphine 15 MG 12 hr tablet  Commonly known as:  MS CONTIN  Take 1 tablet (15 mg total) by mouth every 12 (twelve) hours.     multivitamin with minerals Tabs tablet  Take 1 tablet by mouth daily.     Oxycodone HCl 10 MG Tabs  Take 1 tablet (10 mg total) by  mouth every 6 (six) hours as needed.     polyethylene glycol packet  Commonly known as:  MIRALAX / GLYCOLAX  Take 17 g by mouth daily as needed for mild constipation.     promethazine 25 MG tablet  Commonly known as:  PHENERGAN  Take 1 tablet (25 mg total) by mouth every 6 (six) hours as needed for nausea or vomiting.     Vitamin D (Ergocalciferol) 50000 units Caps capsule  Commonly known as:  DRISDOL  Take 1 capsule (50,000 Units total) by mouth every 7 (seven) days. Takes on Sundays.       Allergies  Allergen Reactions  . Desferal [Deferoxamine] Hives    Local reaction on arm only during infusion. Can take with benadryl   . Latex Other (See Comments)    REACTION: Pt experiences a burning sensation on contacted skin  areas  . Lisinopril Other (See Comments) and Cough    REACTION: Sore/scratchy throat  . Tape Other (See Comments)    REACTION: Pt. Experiences a burning sensation on contacted skin areas       Follow-up Information    Follow up with JEGEDE, OLUGBEMIGA, MD. Schedule an appointment as soon as possible for a visit in 2 weeks.   Specialty:  Internal Medicine   Contact information:   [place] [address] [phone]        The results of significant diagnostics from this hospitalization (including imaging, microbiology, ancillary and laboratory) are listed below for reference.    Significant Diagnostic Studies: Dg Chest 2 View  01/11/2016  CLINICAL DATA:  35 year old female with chest pain and body aches since 1900 hours yesterday. Sickle cell disease. Initial encounter. EXAM: CHEST  2 VIEW COMPARISON:  12/09/2015 and earlier. FINDINGS: Normal lung volumes. Stable mild cardiomegaly. Other mediastinal contours are within normal limits. Visualized tracheal air column is within normal limits. Borderline to mild pulmonary interstitial markings are stable in the lungs otherwise are clear. No pneumothorax or effusion. Right upper quadrant surgical clips re- demonstrated. No osseous abnormality identified. IMPRESSION: No acute cardiopulmonary abnormality. Electronically Signed   By: Genevie Ann M.D.   On: 01/11/2016 09:29   Dg Chest Port 1 View  01/12/2016  CLINICAL DATA:  Sickle cell anemia with left-sided chest pain and shortness of breath. Onset today. Fever. EXAM: PORTABLE CHEST 1 VIEW COMPARISON:  Frontal and lateral views yesterday. FINDINGS: Lower lung volumes from prior exam likely accentuating the cardiac size. Mild cardiomegaly is likely unchanged allowing for differences in technique. Mildly increased interstitial markings is grossly stable. No focal airspace disease. No pleural effusion or pneumothorax. Avascular necrosis of both humeral heads. IMPRESSION: Hypoventilatory  chest with grossly stable cardiomegaly and interstitial prominence. No focal opacity. Electronically Signed   By: Jeb Levering M.D.   On: 01/12/2016 22:10    Microbiology: No results found for this or any previous visit (from the past 240 hour(s)).   Labs: Basic Metabolic Panel:  Recent Labs Lab 01/10/16 1206 01/11/16 0745 01/11/16 1504  NA 139 137 139  K 3.6 3.6 3.7  CL 106 106 109  CO2 23 25 22   GLUCOSE 101* 98 97  BUN 5* 5* <5*  CREATININE 0.41* 0.34* 0.38*  CALCIUM 8.4* 8.5* 8.5*  MG  --   --  1.8   Liver Function Tests:  Recent Labs Lab 01/10/16 1206 01/11/16 0745 01/11/16 1504  AST 94* 98* 94*  ALT 33 33 34  ALKPHOS 134* 134* 134*  BILITOT 12.3*  11.4* 11.0*  PROT 8.0 7.9 7.8  ALBUMIN 3.1* 3.0* 3.1*   No results for input(s): LIPASE, AMYLASE in the last 168 hours. No results for input(s): AMMONIA in the last 168 hours. CBC:  Recent Labs Lab 01/10/16 1206 01/11/16 0745 01/11/16 1504  WBC 8.7 7.9 9.1  NEUTROABS 4.6 4.6 4.7  HGB 8.2* 8.0* 8.1*  HCT 22.6* 21.9* 21.9*  MCV 98.7 98.6 98.2  PLT PLATELET CLUMPS NOTED ON SMEAR, COUNT APPEARS ADEQUATE PLATELET CLUMPS NOTED ON SMEAR, COUNT APPEARS ADEQUATE PLATELET CLUMPS NOTED ON SMEAR, COUNT APPEARS ADEQUATE   Cardiac Enzymes: No results for input(s): CKTOTAL, CKMB, CKMBINDEX, TROPONINI in the last 168 hours. BNP: BNP (last 3 results) No results for input(s): BNP in the last 8760 hours.  ProBNP (last 3 results) No results for input(s): PROBNP in the last 8760 hours.  CBG: No results for input(s): GLUCAP in the last 168 hours.     SignedLelon Frohlich  [facility] Pager: [phone] 01/15/2016, 5:34 PM

## 2016-01-30 ENCOUNTER — Emergency Department (HOSPITAL_COMMUNITY)
Admission: EM | Admit: 2016-01-30 | Discharge: 2016-01-30 | Disposition: A | Discharge status: Home / Self Care | Payer: BLUE CROSS/BLUE SHIELD | Attending: Emergency Medicine | Admitting: Emergency Medicine

## 2016-01-30 ENCOUNTER — Encounter (HOSPITAL_COMMUNITY): Payer: Self-pay | Admitting: Emergency Medicine

## 2016-01-30 DIAGNOSIS — M549 Dorsalgia, unspecified: Secondary | ICD-10-CM | POA: Insufficient documentation

## 2016-01-30 DIAGNOSIS — Z79899 Other long term (current) drug therapy: Secondary | ICD-10-CM | POA: Diagnosis not present

## 2016-01-30 DIAGNOSIS — D5700 Hb-SS disease with crisis, unspecified: Secondary | ICD-10-CM | POA: Insufficient documentation

## 2016-01-30 DIAGNOSIS — Z791 Long term (current) use of non-steroidal anti-inflammatories (NSAID): Secondary | ICD-10-CM | POA: Diagnosis not present

## 2016-01-30 LAB — CBC WITH DIFFERENTIAL/PLATELET
BASOS ABS: 0 10*3/uL (ref 0.0–0.1)
BASOS PCT: 1 %
Eosinophils Absolute: 0.1 10*3/uL (ref 0.0–0.7)
Eosinophils Relative: 2 %
HEMATOCRIT: 23 % — AB (ref 36.0–46.0)
HEMOGLOBIN: 8.3 g/dL — AB (ref 12.0–15.0)
LYMPHS PCT: 29 %
Lymphs Abs: 2.1 10*3/uL (ref 0.7–4.0)
MCH: 35.9 pg — ABNORMAL HIGH (ref 26.0–34.0)
MCHC: 36.1 g/dL — ABNORMAL HIGH (ref 30.0–36.0)
MCV: 99.6 fL (ref 78.0–100.0)
MONOS PCT: 17 %
Monocytes Absolute: 1.3 10*3/uL — ABNORMAL HIGH (ref 0.1–1.0)
NEUTROS ABS: 3.8 10*3/uL (ref 1.7–7.7)
NEUTROS PCT: 52 %
Platelets: ADEQUATE 10*3/uL (ref 150–400)
RBC: 2.31 MIL/uL — ABNORMAL LOW (ref 3.87–5.11)
RDW: 18.6 % — ABNORMAL HIGH (ref 11.5–15.5)
WBC: 7.3 10*3/uL (ref 4.0–10.5)

## 2016-01-30 LAB — COMPREHENSIVE METABOLIC PANEL
ALBUMIN: 3 g/dL — AB (ref 3.5–5.0)
ALK PHOS: 135 U/L — AB (ref 38–126)
ALT: 28 U/L (ref 14–54)
AST: 83 U/L — ABNORMAL HIGH (ref 15–41)
Anion gap: 7 (ref 5–15)
BUN: 5 mg/dL — AB (ref 6–20)
CALCIUM: 8.4 mg/dL — AB (ref 8.9–10.3)
CO2: 24 mmol/L (ref 22–32)
CREATININE: 0.36 mg/dL — AB (ref 0.44–1.00)
Chloride: 106 mmol/L (ref 101–111)
GFR calc Af Amer: >60 mL/min (ref >60)
GFR calc non Af Amer: >60 mL/min (ref >60)
GLUCOSE: 108 mg/dL — AB (ref 65–99)
Potassium: 3.6 mmol/L (ref 3.5–5.1)
SODIUM: 137 mmol/L (ref 135–145)
Total Bilirubin: 9.3 mg/dL — ABNORMAL HIGH (ref 0.3–1.2)
Total Protein: 8 g/dL (ref 6.5–8.1)

## 2016-01-30 LAB — I-STAT BETA HCG BLOOD, ED (MC, WL, AP ONLY): I-stat hCG, quantitative: <5 m[IU]/mL (ref <5)

## 2016-01-30 LAB — RETICULOCYTES
RBC.: 2.31 MIL/uL — ABNORMAL LOW (ref 3.87–5.11)
RETIC COUNT ABSOLUTE: 459.7 10*3/uL — AB (ref 19.0–186.0)
Retic Ct Pct: 19.9 % — ABNORMAL HIGH (ref 0.4–3.1)

## 2016-01-30 MED ORDER — ONDANSETRON HCL 4 MG/2ML IJ SOLN
4.0000 mg | Freq: Once | INTRAMUSCULAR | Status: AC
  Administered 2016-01-30: 4 mg via INTRAVENOUS
  Filled 2016-01-30: qty 2

## 2016-01-30 MED ORDER — SODIUM CHLORIDE 0.45 % IV SOLN
INTRAVENOUS | Status: DC
  Administered 2016-01-30: 10:00:00 via INTRAVENOUS

## 2016-01-30 MED ORDER — HYDROMORPHONE HCL 2 MG/ML IJ SOLN
2.0000 mg | INTRAMUSCULAR | Status: AC | PRN
  Administered 2016-01-30 (×3): 2 mg via INTRAVENOUS
  Filled 2016-01-30 (×3): qty 1

## 2016-01-30 MED ORDER — METOCLOPRAMIDE HCL 5 MG/ML IJ SOLN
10.0000 mg | Freq: Once | INTRAMUSCULAR | Status: AC
  Administered 2016-01-30: 10 mg via INTRAVENOUS
  Filled 2016-01-30: qty 2

## 2016-01-30 NOTE — Discharge Instructions (Signed)
Please call Dr. Doreene Burke today or tomorrow to set up a close follow-up appointment. Return for worsening symptoms, including chest pain, difficulty breathing, or any other symptoms concerning to you.  Sickle Cell Anemia, Adult Sickle cell anemia is a condition in which red blood cells have an abnormal "sickle" shape. This abnormal shape shortens the cells' life span, which results in a lower than normal concentration of red blood cells in the blood. The sickle shape also causes the cells to clump together and block free blood flow through the blood vessels. As a result, the tissues and organs of the body do not receive enough oxygen. Sickle cell anemia causes organ damage and pain and increases the risk of infection. CAUSES  Sickle cell anemia is a genetic disorder. Those who receive two copies of the gene have the condition, and those who receive one copy have the trait. RISK FACTORS The sickle cell gene is most common in people whose families originated in [place]. Other areas of the globe where sickle cell trait occurs include the [place], [place] and [place], and the [place].  SIGNS AND SYMPTOMS  Pain, especially in the extremities, back, chest, or abdomen (common). The pain may start suddenly or may develop following an illness, especially if there is dehydration. Pain can also occur due to overexertion or exposure to extreme temperature changes.  Frequent severe bacterial infections, especially certain types of pneumonia and meningitis.  Pain and swelling in the hands and feet.  Decreased activity.   Loss of appetite.   Change in behavior.  Headaches.  Seizures.  Shortness of breath or difficulty breathing.  Vision changes.  Skin ulcers. Those with the trait may not have symptoms or they may have mild symptoms.  DIAGNOSIS  Sickle cell anemia is diagnosed with blood tests that demonstrate the genetic trait. It is often diagnosed during the newborn  period, due to mandatory testing nationwide. A variety of blood tests, X-rays, CT scans, MRI scans, ultrasounds, and lung function tests may also be done to monitor the condition. TREATMENT  Sickle cell anemia may be treated with:  Medicines. You may be given pain medicines, antibiotic medicines (to treat and prevent infections) or medicines to increase the production of certain types of hemoglobin.  Fluids.  Oxygen.  Blood transfusions. HOME CARE INSTRUCTIONS   Drink enough fluid to keep your urine clear or pale yellow. Increase your fluid intake in hot weather and during exercise.  Do not smoke. Smoking lowers oxygen levels in the blood.   Only take over-the-counter or prescription medicines for pain, fever, or discomfort as directed by your health care provider.  Take antibiotics as directed by your health care provider. Make sure you finish them it even if you start to feel better.   Take supplements as directed by your health care provider.   Consider wearing a medical alert bracelet. This tells anyone caring for you in an emergency of your condition.   When traveling, keep your medical information, health care provider's names, and the medicines you take with you at all times.   If you develop a fever, do not take medicines to reduce the fever right away. This could cover up a problem that is developing. Notify your health care provider.  Keep all follow-up appointments with your health care provider. Sickle cell anemia requires regular medical care. SEEK MEDICAL CARE IF: You have a fever. SEEK IMMEDIATE MEDICAL CARE IF:   You feel dizzy or faint.   You have new  abdominal pain, especially on the left side near the stomach area.   You develop a persistent, often uncomfortable and painful penile erection (priapism). If this is not treated immediately it will lead to impotence.   You have numbness your arms or legs or you have a hard time moving them.   You have  a hard time with speech.   You have a fever or persistent symptoms for more than 2-3 days.   You have a fever and your symptoms suddenly get worse.   You have signs or symptoms of infection. These include:   Chills.   Abnormal tiredness (lethargy).   Irritability.   Poor eating.   Vomiting.   You develop pain that is not helped with medicine.   You develop shortness of breath.  You have pain in your chest.   You are coughing up pus-like or bloody sputum.   You develop a stiff neck.  Your feet or hands swell or have pain.  Your abdomen appears bloated.  You develop joint pain. MAKE SURE YOU:  Understand these instructions.   This information is not intended to replace advice given to you by your health care provider. Make sure you discuss any questions you have with your health care provider.   Document Released: 12/19/2005 Document Revised: 10/01/2014 Document Reviewed: 04/22/2013 Elsevier Interactive Patient Education [employer].

## 2016-01-30 NOTE — ED Notes (Signed)
PT c/o generalized body pain worsening x2 days with no relief from home prn medications.

## 2016-01-30 NOTE — ED Provider Notes (Signed)
CSN: [id number]     Arrival date & time 01/30/16  0803 History  By signing my name below, I, Essence Howell, attest that this documentation has been prepared under the direction and in the presence of Forde Dandy, MD Electronically Signed: Ladene Artist, ED Scribe 01/30/2016 at 12:41 PM.   Chief Complaint  Patient presents with  . Sickle Cell Pain Crisis   The history is provided by the patient. No language interpreter was used.   HPI Comments: Dawn [name] is a 35 y.o. female, with a h/o sickle cell disease, who presents to the Emergency Department complaining of sickle cell pain crisis onset 2 days ago. Pt reports constant, 8/10 pain in her low back, upper and lower extremities. She states that pain is similar to pain that she typically experiences with her sickle cell pain crisis. She denies fever, chills, appetite change, chest pain, SOB, abdominal pain, nausea, vomiting. No h/o DVT/ PE.   Past Medical History  Diagnosis Date  . Sickle cell disease ([place])   . 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 Blood transfusion   . 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 HCAP (healthcare-associated pneumonia) 05/19/2013  . Sickle cell anemia ([facility])   . 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 Vitamin B12 deficiency 07/07/2015   Past Surgical History  Procedure Laterality Date  . Cholecystectomy    .  left knee acl reconstruction    . Cesarean section      x 2  . Portacath placement    . Portacath placement Left 02/23/2013    Procedure: INSERTION PORT-A-CATH;  Surgeon: Donato Heinz, MD;  Location: [facility];  Service: General;  Laterality: Left;  . Port-a-cath removal Left 07/29/2013    Procedure: REMOVAL PORT-A-CATH;  Surgeon: Scherry Ran, MD;  Location: [facility];  Service: General;  Laterality: Left;  . Tee without cardioversion N/A 08/07/2013    Procedure: TRANSESOPHAGEAL ECHOCARDIOGRAM (TEE);  Surgeon: Arnoldo Lenis, MD;  Location: [facility];  Service: Cardiology;  Laterality: N/A;    Family History  Problem Relation Age of Onset  . Sickle cell trait Mother   . Sickle cell trait Father   . Hypertension Father   . Diabetes Father   . Sickle cell trait Sister   . Cancer Paternal Aunt     Breast   Social History  Substance Use Topics  . Smoking status: Never Smoker   . Smokeless tobacco: Never Used  . Alcohol Use: No   OB History    Gravida Para Term Preterm AB TAB SAB Ectopic Multiple Living   1 1  1      1      Review of Systems  Constitutional: Negative for fever, chills and appetite change.  Respiratory: Negative for shortness of breath.   Cardiovascular: Negative for chest pain.  Gastrointestinal: Negative for nausea, vomiting and abdominal pain.  Musculoskeletal: Positive for back pain and arthralgias.  All other systems reviewed and are negative.  Allergies  Desferal; Latex; Lisinopril; and Tape  Home Medications   Prior to Admission medications   Medication Sig Start Date End Date Taking? Authorizing Provider  acetaminophen (TYLENOL) 500 MG tablet Take 500 mg by mouth every 6 (six) hours as needed for mild pain.    Yes Historical Provider, MD  folic acid (FOLVITE) 1 MG tablet Take 1 tablet (1 mg total) by mouth daily. 08/16/15  Yes [name], FNP  hydroxyurea (HYDREA) 500 MG capsule Take 2 capsules (1,000 mg  total) by mouth daily. May take with food to minimize GI side effects. 09/05/15  Yes [name], FNP  ibuprofen (ADVIL,MOTRIN) 200 MG tablet Take 200 mg by mouth every 6 (six) hours as needed for mild pain or moderate pain.   Yes Historical Provider, MD  levonorgestrel (MIRENA) 20 MCG/24HR IUD 1 each by Intrauterine route once.   Yes Historical Provider, MD  loratadine (CLARITIN) 10 MG tablet Take 10 mg by mouth daily.   Yes Historical Provider, MD  morphine (MS CONTIN) 15 MG 12 hr tablet Take 1 tablet (15 mg total) by mouth every 12 (twelve) hours. 10/06/15  Yes [name], FNP  Multiple Vitamin (MULTIVITAMIN WITH MINERALS)  TABS tablet Take 1 tablet by mouth daily.   Yes Historical Provider, MD  Oxycodone HCl 10 MG TABS Take 1 tablet (10 mg total) by mouth every 6 (six) hours as needed. Patient taking differently: Take 10 mg by mouth every 4 (four) hours as needed (breakthrough pain).  10/06/15  Yes [name], FNP  promethazine (PHENERGAN) 25 MG tablet Take 1 tablet (25 mg total) by mouth every 6 (six) hours as needed for nausea or vomiting. 12/07/15  Yes [name], MD  Vitamin D, Ergocalciferol, (DRISDOL) 50000 UNITS CAPS capsule Take 1 capsule (50,000 Units total) by mouth every 7 (seven) days. Takes on Sundays. 08/16/15  Yes [name], FNP  cyclobenzaprine (FLEXERIL) 10 MG tablet Take 1 tablet (10 mg total) by mouth 3 (three) times daily as needed for muscle spasms. Patient not taking: Reported on 12/31/2015 12/14/15   [name], MD  hydrOXYzine (ATARAX/VISTARIL) 10 MG tablet Take 1 tablet (10 mg total) by mouth 3 (three) times daily as needed for itching. Patient not taking: Reported on 01/30/2016 12/14/15   [name], MD  polyethylene glycol [facility] / Floria Raveling) packet Take 17 g by mouth daily as needed for mild constipation. Patient not taking: Reported on 01/30/2016 01/15/16   Erline Hau, MD   BP 149/77 mmHg  Pulse 90  Temp(Src) 98.4 F (36.9 C) (Oral)  Resp 18  Ht 5\' 3"  (1.6 m)  Wt 167 lb (75.751 kg)  BMI 29.59 kg/m2  SpO2 95% Physical Exam  Physical Exam  Nursing note and vitals reviewed. Constitutional: Well developed, well nourished, non-toxic, and in no acute distress Head: Normocephalic and atraumatic.  Mouth/Throat: Oropharynx is clear and moist.  Neck: Normal range of motion. Neck supple.  Cardiovascular: Normal rate and regular rhythm.   Pulmonary/Chest: Effort normal and breath sounds normal.  Abdominal: Soft. There is no tenderness. There is no rebound and no guarding.  Musculoskeletal: Normal range of motion. No LE edema. Neurological: Alert, no facial droop, fluent  speech, moves all extremities symmetrically Skin: Skin is warm and dry.  Psychiatric: Cooperative  ED Course  Procedures (including critical care time) DIAGNOSTIC STUDIES: Oxygen Saturation is 95% on RA, normal by my interpretation.    COORDINATION OF CARE: 9:01 AM-Discussed treatment plan which includes CBC, CMP, I-stat, reticulocytes, Zofran, Dilaudid with pt at bedside and pt agreed to plan.   Labs Review Labs Reviewed  CBC WITH DIFFERENTIAL/PLATELET - Abnormal; Notable for the following:    RBC 2.31 (*)    Hemoglobin 8.3 (*)    HCT 23.0 (*)    MCH 35.9 (*)    MCHC 36.1 (*)    RDW 18.6 (*)    Monocytes Absolute 1.3 (*)    All other components within normal limits  COMPREHENSIVE METABOLIC PANEL - Abnormal;  Notable for the following:    Glucose, Bld 108 (*)    BUN 5 (*)    Creatinine, Ser 0.36 (*)    Calcium 8.4 (*)    Albumin 3.0 (*)    AST 83 (*)    Alkaline Phosphatase 135 (*)    Total Bilirubin 9.3 (*)    All other components within normal limits  RETICULOCYTES - Abnormal; Notable for the following:    Retic Ct Pct 19.9 (*)    RBC. 2.31 (*)    Retic Count, Manual 459.7 (*)    All other components within normal limits  I-STAT BETA HCG BLOOD, ED ([facility], [facility], [facility] ONLY)   Imaging Review No results found. I have personally reviewed and evaluated these images and lab results as part of my medical decision-making.   EKG Interpretation None      MDM   Final diagnoses:  Sickle cell pain crisis ([place])    35 year old female with history of sickle cell anemia who presents with sickle cell pain crisis. Presentation appears comfortable with stable vital signs. Pain typical for that of her sickle cell pain crisis. No chest pain, difficulty breathing, hypoxia or fever. She has stable anemia with appropriate reticulocytosis. Baseline hyperbilirubinemia. Given half-normal saline with 2 mg Dilaudid 3 with resolution of pain back to her baseline. Appropriate for outpatient  management. She will follow-up with sickle cell clinic. Strict return and follow-up instructions reviewed. She expressed understanding of all discharge instructions and felt comfortable with the plan of care.   I personally performed the services described in this documentation, which was scribed in my presence. The recorded information has been reviewed and is accurate.     Forde Dandy, MD 01/30/16 1400

## 2016-01-30 NOTE — ED Notes (Signed)
Called to patients room. Pt vomiting. Dr Oleta Mouse notified with order to administer Reglan 10 mg IV now

## 2016-02-04 ENCOUNTER — Emergency Department (HOSPITAL_COMMUNITY)
Admission: EM | Admit: 2016-02-04 | Discharge: 2016-02-04 | Disposition: A | Discharge status: Home / Self Care | Payer: BLUE CROSS/BLUE SHIELD | Source: Home / Self Care | Attending: Emergency Medicine | Admitting: Emergency Medicine

## 2016-02-04 DIAGNOSIS — Z79899 Other long term (current) drug therapy: Secondary | ICD-10-CM

## 2016-02-04 DIAGNOSIS — Z791 Long term (current) use of non-steroidal anti-inflammatories (NSAID): Secondary | ICD-10-CM | POA: Insufficient documentation

## 2016-02-04 DIAGNOSIS — D5700 Hb-SS disease with crisis, unspecified: Secondary | ICD-10-CM | POA: Insufficient documentation

## 2016-02-04 DIAGNOSIS — A419 Sepsis, unspecified organism: Secondary | ICD-10-CM | POA: Diagnosis not present

## 2016-02-04 DIAGNOSIS — R509 Fever, unspecified: Secondary | ICD-10-CM | POA: Diagnosis not present

## 2016-02-04 LAB — CBC WITH DIFFERENTIAL/PLATELET
BASOS ABS: 0.1 10*3/uL (ref 0.0–0.1)
BASOS PCT: 1 %
EOS ABS: 0.2 10*3/uL (ref 0.0–0.7)
EOS PCT: 2 %
HCT: 21.4 % — ABNORMAL LOW (ref 36.0–46.0)
Hemoglobin: 7.6 g/dL — ABNORMAL LOW (ref 12.0–15.0)
Lymphocytes Relative: 29 %
Lymphs Abs: 2.5 10*3/uL (ref 0.7–4.0)
MCH: 35.2 pg — ABNORMAL HIGH (ref 26.0–34.0)
MCHC: 35.5 g/dL (ref 30.0–36.0)
MCV: 99.1 fL (ref 78.0–100.0)
MONO ABS: 1.5 10*3/uL — AB (ref 0.1–1.0)
Monocytes Relative: 17 %
Neutro Abs: 4.5 10*3/uL (ref 1.7–7.7)
Neutrophils Relative %: 51 %
PLATELETS: 189 10*3/uL (ref 150–400)
RBC: 2.16 MIL/uL — ABNORMAL LOW (ref 3.87–5.11)
RDW: 19.3 % — AB (ref 11.5–15.5)
WBC: 8.7 10*3/uL (ref 4.0–10.5)

## 2016-02-04 LAB — COMPREHENSIVE METABOLIC PANEL
ALBUMIN: 2.9 g/dL — AB (ref 3.5–5.0)
ALK PHOS: 138 U/L — AB (ref 38–126)
ALT: 30 U/L (ref 14–54)
ANION GAP: 3 — AB (ref 5–15)
AST: 87 U/L — AB (ref 15–41)
BILIRUBIN TOTAL: 11.5 mg/dL — AB (ref 0.3–1.2)
BUN: 8 mg/dL (ref 6–20)
CALCIUM: 8.3 mg/dL — AB (ref 8.9–10.3)
CO2: 24 mmol/L (ref 22–32)
Chloride: 107 mmol/L (ref 101–111)
Creatinine, Ser: 0.33 mg/dL — ABNORMAL LOW (ref 0.44–1.00)
GFR calc Af Amer: >60 mL/min (ref >60)
GFR calc non Af Amer: >60 mL/min (ref >60)
GLUCOSE: 111 mg/dL — AB (ref 65–99)
Potassium: 3.4 mmol/L — ABNORMAL LOW (ref 3.5–5.1)
SODIUM: 134 mmol/L — AB (ref 135–145)
TOTAL PROTEIN: 7.9 g/dL (ref 6.5–8.1)

## 2016-02-04 LAB — RETICULOCYTES
RBC.: 2.16 MIL/uL — AB (ref 3.87–5.11)
Retic Ct Pct: >23 % — ABNORMAL HIGH (ref 0.4–3.1)

## 2016-02-04 MED ORDER — HYDROMORPHONE HCL 1 MG/ML IJ SOLN
2.0000 mg | Freq: Once | INTRAMUSCULAR | Status: AC
  Administered 2016-02-04: 2 mg via INTRAVENOUS
  Filled 2016-02-04: qty 2

## 2016-02-04 MED ORDER — HYDROMORPHONE HCL 2 MG/ML IJ SOLN
2.0000 mg | Freq: Once | INTRAMUSCULAR | Status: AC
  Administered 2016-02-04: 2 mg via INTRAVENOUS
  Filled 2016-02-04: qty 1

## 2016-02-04 MED ORDER — DIPHENHYDRAMINE HCL 50 MG/ML IJ SOLN
25.0000 mg | Freq: Once | INTRAMUSCULAR | Status: AC
  Administered 2016-02-04: 25 mg via INTRAVENOUS
  Filled 2016-02-04: qty 1

## 2016-02-04 MED ORDER — SODIUM CHLORIDE 0.9 % IV BOLUS (SEPSIS)
1000.0000 mL | Freq: Once | INTRAVENOUS | Status: AC
  Administered 2016-02-04: 1000 mL via INTRAVENOUS

## 2016-02-04 MED ORDER — ONDANSETRON HCL 4 MG/2ML IJ SOLN
4.0000 mg | Freq: Once | INTRAMUSCULAR | Status: AC
  Administered 2016-02-04: 4 mg via INTRAVENOUS
  Filled 2016-02-04: qty 2

## 2016-02-04 MED ORDER — KETOROLAC TROMETHAMINE 30 MG/ML IJ SOLN
30.0000 mg | Freq: Once | INTRAMUSCULAR | Status: AC
  Administered 2016-02-04: 30 mg via INTRAVENOUS
  Filled 2016-02-04: qty 1

## 2016-02-04 NOTE — Discharge Instructions (Signed)
Drink plenty of fluids. Continue your hydrea and folic acid.  Recheck if you get a fever, chest pain, shortness of breath or your pain gets worse.    Sickle Cell Anemia, Adult Sickle cell anemia is a condition in which red blood cells have an abnormal "sickle" shape. This abnormal shape shortens the cells' life span, which results in a lower than normal concentration of red blood cells in the blood. The sickle shape also causes the cells to clump together and block free blood flow through the blood vessels. As a result, the tissues and organs of the body do not receive enough oxygen. Sickle cell anemia causes organ damage and pain and increases the risk of infection. CAUSES  Sickle cell anemia is a genetic disorder. Those who receive two copies of the gene have the condition, and those who receive one copy have the trait. RISK FACTORS The sickle cell gene is most common in people whose families originated in [place]. Other areas of the globe where sickle cell trait occurs include the [place], [place] and [place], and the [place].  SIGNS AND SYMPTOMS  Pain, especially in the extremities, back, chest, or abdomen (common). The pain may start suddenly or may develop following an illness, especially if there is dehydration. Pain can also occur due to overexertion or exposure to extreme temperature changes.  Frequent severe bacterial infections, especially certain types of pneumonia and meningitis.  Pain and swelling in the hands and feet.  Decreased activity.   Loss of appetite.   Change in behavior.  Headaches.  Seizures.  Shortness of breath or difficulty breathing.  Vision changes.  Skin ulcers. Those with the trait may not have symptoms or they may have mild symptoms.  DIAGNOSIS  Sickle cell anemia is diagnosed with blood tests that demonstrate the genetic trait. It is often diagnosed during the newborn period, due to mandatory testing nationwide. A  variety of blood tests, X-rays, CT scans, MRI scans, ultrasounds, and lung function tests may also be done to monitor the condition. TREATMENT  Sickle cell anemia may be treated with:  Medicines. You may be given pain medicines, antibiotic medicines (to treat and prevent infections) or medicines to increase the production of certain types of hemoglobin.  Fluids.  Oxygen.  Blood transfusions. HOME CARE INSTRUCTIONS   Drink enough fluid to keep your urine clear or pale yellow. Increase your fluid intake in hot weather and during exercise.  Do not smoke. Smoking lowers oxygen levels in the blood.   Only take over-the-counter or prescription medicines for pain, fever, or discomfort as directed by your health care provider.  Take antibiotics as directed by your health care provider. Make sure you finish them it even if you start to feel better.   Take supplements as directed by your health care provider.   Consider wearing a medical alert bracelet. This tells anyone caring for you in an emergency of your condition.   When traveling, keep your medical information, health care provider's names, and the medicines you take with you at all times.   If you develop a fever, do not take medicines to reduce the fever right away. This could cover up a problem that is developing. Notify your health care provider.  Keep all follow-up appointments with your health care provider. Sickle cell anemia requires regular medical care. SEEK MEDICAL CARE IF: You have a fever. SEEK IMMEDIATE MEDICAL CARE IF:   You feel dizzy or faint.   You have new abdominal  pain, especially on the left side near the stomach area.   You develop a persistent, often uncomfortable and painful penile erection (priapism). If this is not treated immediately it will lead to impotence.   You have numbness your arms or legs or you have a hard time moving them.   You have a hard time with speech.   You have a fever  or persistent symptoms for more than 2-3 days.   You have a fever and your symptoms suddenly get worse.   You have signs or symptoms of infection. These include:   Chills.   Abnormal tiredness (lethargy).   Irritability.   Poor eating.   Vomiting.   You develop pain that is not helped with medicine.   You develop shortness of breath.  You have pain in your chest.   You are coughing up pus-like or bloody sputum.   You develop a stiff neck.  Your feet or hands swell or have pain.  Your abdomen appears bloated.  You develop joint pain. MAKE SURE YOU:  Understand these instructions.   This information is not intended to replace advice given to you by your health care provider. Make sure you discuss any questions you have with your health care provider.   Document Released: 12/19/2005 Document Revised: 10/01/2014 Document Reviewed: 04/22/2013 Elsevier Interactive Patient Education [employer].

## 2016-02-04 NOTE — ED Notes (Signed)
Sickle cell pain to arms and legs x 2 days.

## 2016-02-04 NOTE — ED Provider Notes (Signed)
CSN: [id number]     Arrival date & time 02/04/16  0406 History   First MD Initiated Contact with Patient 02/04/16 0425 AM    Chief Complaint  Patient presents with  . Sickle Cell Pain Crisis     (Consider location/radiation/quality/duration/timing/severity/associated sxs/prior Treatment) HPI patient has a history of sickle cell anemia, SS, reports she started having a flareup on May 11. She complains of pain in her arms, legs, and her low back. She denies chest pain, shortness of breath, cough, fever, nausea or vomiting. Been taking her medications but they are not helping. Patient was just admitted to the hospital at the end of April for sickle cell crisis.  Dr Doreene Burke  Past Medical History  Diagnosis Date  . Sickle cell disease ([place])   . 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 Blood transfusion   . 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 HCAP (healthcare-associated pneumonia) 05/19/2013  . Sickle cell anemia ([facility])   . 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 Vitamin B12 deficiency 07/07/2015   Past Surgical History  Procedure Laterality Date  . Cholecystectomy    .  left knee acl reconstruction    . Cesarean section      x 2  . Portacath placement    . Portacath placement Left 02/23/2013    Procedure: INSERTION PORT-A-CATH;  Surgeon: Donato Heinz, MD;  Location: [facility];  Service: General;  Laterality: Left;  . Port-a-cath removal Left 07/29/2013    Procedure: REMOVAL PORT-A-CATH;  Surgeon: Scherry Ran, MD;  Location: [facility];  Service: General;  Laterality: Left;  . Tee without cardioversion N/A 08/07/2013    Procedure: TRANSESOPHAGEAL ECHOCARDIOGRAM (TEE);  Surgeon: Arnoldo Lenis, MD;  Location: [facility];  Service: Cardiology;  Laterality: N/A;   Family History  Problem Relation Age of Onset  . Sickle cell trait Mother   . Sickle cell trait Father   . Hypertension Father   . Diabetes Father   . Sickle cell trait Sister   . Cancer Paternal Aunt     Breast    Social History  Substance Use Topics  . Smoking status: Never Smoker   . Smokeless tobacco: Never Used  . Alcohol Use: No  employed as dialysis tech  OB History    Gravida Para Term Preterm AB TAB SAB Ectopic Multiple Living   1 1  1      1      Review of Systems  All other systems reviewed and are negative.     Allergies  Desferal; Latex; Lisinopril; and Tape  Home Medications   Prior to Admission medications   Medication Sig Start Date End Date Taking? Authorizing Provider  acetaminophen (TYLENOL) 500 MG tablet Take 500 mg by mouth every 6 (six) hours as needed for mild pain.    Yes Historical Provider, MD  folic acid (FOLVITE) 1 MG tablet Take 1 tablet (1 mg total) by mouth daily. 08/16/15  Yes [name], FNP  hydroxyurea (HYDREA) 500 MG capsule Take 2 capsules (1,000 mg total) by mouth daily. May take with food to minimize GI side effects. 09/05/15  Yes [name], FNP  ibuprofen (ADVIL,MOTRIN) 200 MG tablet Take 200 mg by mouth every 6 (six) hours as needed for mild pain or moderate pain.   Yes Historical Provider, MD  levonorgestrel (MIRENA) 20 MCG/24HR IUD 1 each by Intrauterine route once.   Yes Historical Provider, MD  loratadine (CLARITIN) 10 MG tablet Take 10 mg by  mouth daily.   Yes Historical Provider, MD  morphine (MS CONTIN) 15 MG 12 hr tablet Take 1 tablet (15 mg total) by mouth every 12 (twelve) hours. 10/06/15  Yes [name], FNP  Multiple Vitamin (MULTIVITAMIN WITH MINERALS) TABS tablet Take 1 tablet by mouth daily.   Yes Historical Provider, MD  Oxycodone HCl 10 MG TABS Take 1 tablet (10 mg total) by mouth every 6 (six) hours as needed. Patient taking differently: Take 10 mg by mouth every 4 (four) hours as needed (breakthrough pain).  10/06/15  Yes [name], FNP  promethazine (PHENERGAN) 25 MG tablet Take 1 tablet (25 mg total) by mouth every 6 (six) hours as needed for nausea or vomiting. 12/07/15  Yes [name], MD  Vitamin D,  Ergocalciferol, (DRISDOL) 50000 UNITS CAPS capsule Take 1 capsule (50,000 Units total) by mouth every 7 (seven) days. Takes on Sundays. 08/16/15  Yes [name], FNP  cyclobenzaprine (FLEXERIL) 10 MG tablet Take 1 tablet (10 mg total) by mouth 3 (three) times daily as needed for muscle spasms. Patient not taking: Reported on 12/31/2015 12/14/15   [name], MD  hydrOXYzine (ATARAX/VISTARIL) 10 MG tablet Take 1 tablet (10 mg total) by mouth 3 (three) times daily as needed for itching. Patient not taking: Reported on 01/30/2016 12/14/15   [name], MD  polyethylene glycol [facility] / Floria Raveling) packet Take 17 g by mouth daily as needed for mild constipation. Patient not taking: Reported on 01/30/2016 01/15/16   Erline Hau, MD   BP 164/84 mmHg  Pulse 84  Temp(Src) 99.1 F (37.3 C) (Oral)  Resp 20  Ht 5\' 3"  (1.6 m)  Wt 167 lb (75.751 kg)  BMI 29.59 kg/m2  SpO2 98%  Vital signs normal   Physical Exam  Constitutional: She is oriented to person, place, and time. She appears well-developed and well-nourished.  Non-toxic appearance. She does not appear ill. She appears distressed.  HENT:  Head: Normocephalic and atraumatic.  Right Ear: External ear normal.  Left Ear: External ear normal.  Nose: Nose normal. No mucosal edema or rhinorrhea.  Mouth/Throat: Oropharynx is clear and moist and mucous membranes are normal. No dental abscesses or uvula swelling.  The roof of her mouth is yellow  Eyes: Conjunctivae and EOM are normal. Pupils are equal, round, and reactive to light. Scleral icterus is present.  Neck: Normal range of motion and full passive range of motion without pain. Neck supple.  Cardiovascular: Normal rate, regular rhythm and normal heart sounds.  Exam reveals no gallop and no friction rub.   No murmur heard. Pulmonary/Chest: Effort normal and breath sounds normal. No respiratory distress. She has no wheezes. She has no rhonchi. She has no rales. She exhibits no tenderness  and no crepitus.  Abdominal: Soft. Normal appearance and bowel sounds are normal. She exhibits no distension. There is no tenderness. There is no rebound and no guarding.  Musculoskeletal: Normal range of motion. She exhibits no edema or tenderness.  Moves all extremities well. She appears painful, but she has no swelling of her joints.  Neurological: She is alert and oriented to person, place, and time. She has normal strength. No cranial nerve deficit.  Skin: Skin is warm, dry and intact. No rash noted. No erythema. No pallor.  Psychiatric: She has a normal mood and affect. Her speech is normal and behavior is normal. Her mood appears not anxious.  Nursing note and vitals reviewed.   ED Course  Procedures (including  critical care time)  Medications  sodium chloride 0.9 % bolus 1,000 mL (0 mLs Intravenous Stopped 02/04/16 0618)  HYDROmorphone (DILAUDID) injection 2 mg (2 mg Intravenous Given 02/04/16 0500)  ondansetron (ZOFRAN) injection 4 mg (4 mg Intravenous Given 02/04/16 0501)  diphenhydrAMINE (BENADRYL) injection 25 mg (25 mg Intravenous Given 02/04/16 0500)  HYDROmorphone (DILAUDID) injection 2 mg (2 mg Intravenous Given 02/04/16 0619)  sodium chloride 0.9 % bolus 1,000 mL (0 mLs Intravenous Stopped 02/04/16 0729)  HYDROmorphone (DILAUDID) injection 2 mg (2 mg Intravenous Given 02/04/16 0709)  ketorolac (TORADOL) 30 MG/ML injection 30 mg (30 mg Intravenous Given 02/04/16 0709)    Patient was started on IV fluids and IV hydromorphone for pain, Benadryl for itching, and Zofran for nausea.  Patient was rechecked at 6 AM. She states her pain is improving but she still needs something more for pain. Her hydromorphone was repeated. We discussed her test results and she states she normally does not get up blood transfusion until hemoglobin drops to 5.  Recheck 07:00 Still having pain, but improving, feels one more dose of meds will be enough. Given another hydromorphone and IV toradol. PT states  she has narcotic pain medications at home. She only takes them when she has pain and not every day. She is afraid of becoming addicted to narcotics.   Recheck at 07:25 AM. States she is better, feels she can be discharged, pain is still a 5 but she feels she can manage it at home now.   Review of the [place] shows patient got #60 oxycodone 10 mg tablets on November 25. Reviewing her medications from the hospital in [place] shows she got #60 morphine Contin 15 mg tablets on January 12 however that does not show up on the [employer].    Labs Review   Results for orders placed or performed during the hospital encounter of 02/04/16  Comprehensive metabolic panel  Result Value Ref Range   Sodium 134 (L) 135 - 145 mmol/L   Potassium 3.4 (L) 3.5 - 5.1 mmol/L   Chloride 107 101 - 111 mmol/L   CO2 24 22 - 32 mmol/L   Glucose, Bld 111 (H) 65 - 99 mg/dL   BUN 8 6 - 20 mg/dL   Creatinine, Ser 0.33 (L) 0.44 - 1.00 mg/dL   Calcium 8.3 (L) 8.9 - 10.3 mg/dL   Total Protein 7.9 6.5 - 8.1 g/dL   Albumin 2.9 (L) 3.5 - 5.0 g/dL   AST 87 (H) 15 - 41 U/L   ALT 30 14 - 54 U/L   Alkaline Phosphatase 138 (H) 38 - 126 U/L   Total Bilirubin 11.5 (H) 0.3 - 1.2 mg/dL   GFR calc non Af Amer >60 >60 mL/min   GFR calc Af Amer >60 >60 mL/min   Anion gap 3 (L) 5 - 15  CBC with Differential  Result Value Ref Range   WBC 8.7 4.0 - 10.5 K/uL   RBC 2.16 (L) 3.87 - 5.11 MIL/uL   Hemoglobin 7.6 (L) 12.0 - 15.0 g/dL   HCT 21.4 (L) 36.0 - 46.0 %   MCV 99.1 78.0 - 100.0 fL   MCH 35.2 (H) 26.0 - 34.0 pg   MCHC 35.5 30.0 - 36.0 g/dL   RDW 19.3 (H) 11.5 - 15.5 %   Platelets 189 150 - 400 K/uL   Neutrophils Relative % 51 %   Neutro Abs 4.5 1.7 - 7.7 K/uL   Lymphocytes Relative 29 %   Lymphs  Abs 2.5 0.7 - 4.0 K/uL   Monocytes Relative 17 %   Monocytes Absolute 1.5 (H) 0.1 - 1.0 K/uL   Eosinophils Relative 2 %   Eosinophils Absolute 0.2 0.0 - 0.7 K/uL   Basophils Relative 1 %   Basophils  Absolute 0.1 0.0 - 0.1 K/uL  Reticulocytes  Result Value Ref Range   Retic Ct Pct >23.0 (H) 0.4 - 3.1 %   RBC. 2.16 (L) 3.87 - 5.11 MIL/uL   Retic Count, Manual NOT CALCULATED 19.0 - 186.0 K/uL   Laboratory interpretation all normal except Mild change in her chronic anemia diffusely around 8, elevated reticulocyte count expected with sickle cell crisis, persistently elevated LFTs and bilirubin consistent with hemolysis     Results for orders placed or performed during the hospital encounter of 01/30/16  CBC with Differential  Result Value Ref Range   WBC 7.3 4.0 - 10.5 K/uL   RBC 2.31 (L) 3.87 - 5.11 MIL/uL   Hemoglobin 8.3 (L) 12.0 - 15.0 g/dL   HCT 23.0 (L) 36.0 - 46.0 %   MCV 99.6 78.0 - 100.0 fL   MCH 35.9 (H) 26.0 - 34.0 pg   MCHC 36.1 (H) 30.0 - 36.0 g/dL   RDW 18.6 (H) 11.5 - 15.5 %   Platelets  150 - 400 K/uL    PLATELET CLUMPS NOTED ON SMEAR, COUNT APPEARS ADEQUATE   Neutrophils Relative % 52 %   Neutro Abs 3.8 1.7 - 7.7 K/uL   Lymphocytes Relative 29 %   Lymphs Abs 2.1 0.7 - 4.0 K/uL   Monocytes Relative 17 %   Monocytes Absolute 1.3 (H) 0.1 - 1.0 K/uL   Eosinophils Relative 2 %   Eosinophils Absolute 0.1 0.0 - 0.7 K/uL   Basophils Relative 1 %   Basophils Absolute 0.0 0.0 - 0.1 K/uL   RBC Morphology SICKLE CELLS   Comprehensive metabolic panel  Result Value Ref Range   Sodium 137 135 - 145 mmol/L   Potassium 3.6 3.5 - 5.1 mmol/L   Chloride 106 101 - 111 mmol/L   CO2 24 22 - 32 mmol/L   Glucose, Bld 108 (H) 65 - 99 mg/dL   BUN 5 (L) 6 - 20 mg/dL   Creatinine, Ser 0.36 (L) 0.44 - 1.00 mg/dL   Calcium 8.4 (L) 8.9 - 10.3 mg/dL   Total Protein 8.0 6.5 - 8.1 g/dL   Albumin 3.0 (L) 3.5 - 5.0 g/dL   AST 83 (H) 15 - 41 U/L   ALT 28 14 - 54 U/L   Alkaline Phosphatase 135 (H) 38 - 126 U/L   Total Bilirubin 9.3 (H) 0.3 - 1.2 mg/dL   GFR calc non Af Amer >60 >60 mL/min   GFR calc Af Amer >60 >60 mL/min   Anion gap 7 5 - 15  Reticulocytes  Result Value Ref Range    Retic Ct Pct 19.9 (H) 0.4 - 3.1 %   RBC. 2.31 (L) 3.87 - 5.11 MIL/uL   Retic Count, Manual 459.7 (H) 19.0 - 186.0 K/uL  I-Stat Beta hCG blood, ED ([facility], [facility], [facility] only)  Result Value Ref Range   I-stat hCG, quantitative <5.0 <5 mIU/mL   Comment 3            Laboratory interpretation all normal except    I have personally reviewed and evaluated these images and lab results as part of my medical decision-making.    MDM   Final diagnoses:  Sickle cell crisis ([place])  Plan discharge  Rolland Porter, MD, Barbette Or, MD 02/04/16 [phone]

## 2016-02-05 ENCOUNTER — Emergency Department (HOSPITAL_COMMUNITY): Payer: BLUE CROSS/BLUE SHIELD

## 2016-02-05 ENCOUNTER — Encounter (HOSPITAL_COMMUNITY): Payer: Self-pay | Admitting: Emergency Medicine

## 2016-02-05 ENCOUNTER — Inpatient Hospital Stay (HOSPITAL_COMMUNITY)
Admission: EM | Admit: 2016-02-05 | Discharge: 2016-02-15 | DRG: 871 | Disposition: A | Discharge status: Home / Self Care | Payer: BLUE CROSS/BLUE SHIELD | Attending: Internal Medicine | Admitting: Internal Medicine

## 2016-02-05 DIAGNOSIS — E876 Hypokalemia: Secondary | ICD-10-CM | POA: Diagnosis present

## 2016-02-05 DIAGNOSIS — Z809 Family history of malignant neoplasm, unspecified: Secondary | ICD-10-CM

## 2016-02-05 DIAGNOSIS — D571 Sickle-cell disease without crisis: Secondary | ICD-10-CM

## 2016-02-05 DIAGNOSIS — I248 Other forms of acute ischemic heart disease: Secondary | ICD-10-CM | POA: Diagnosis present

## 2016-02-05 DIAGNOSIS — D5700 Hb-SS disease with crisis, unspecified: Secondary | ICD-10-CM | POA: Diagnosis not present

## 2016-02-05 DIAGNOSIS — Z833 Family history of diabetes mellitus: Secondary | ICD-10-CM

## 2016-02-05 DIAGNOSIS — I272 Pulmonary hypertension, unspecified: Secondary | ICD-10-CM | POA: Diagnosis present

## 2016-02-05 DIAGNOSIS — A419 Sepsis, unspecified organism: Principal | ICD-10-CM | POA: Diagnosis present

## 2016-02-05 DIAGNOSIS — D696 Thrombocytopenia, unspecified: Secondary | ICD-10-CM | POA: Diagnosis present

## 2016-02-05 DIAGNOSIS — I517 Cardiomegaly: Secondary | ICD-10-CM | POA: Diagnosis present

## 2016-02-05 DIAGNOSIS — N390 Urinary tract infection, site not specified: Secondary | ICD-10-CM

## 2016-02-05 DIAGNOSIS — Z8249 Family history of ischemic heart disease and other diseases of the circulatory system: Secondary | ICD-10-CM

## 2016-02-05 DIAGNOSIS — R509 Fever, unspecified: Secondary | ICD-10-CM | POA: Diagnosis not present

## 2016-02-05 DIAGNOSIS — D57219 Sickle-cell/Hb-C disease with crisis, unspecified: Secondary | ICD-10-CM | POA: Diagnosis present

## 2016-02-05 DIAGNOSIS — D649 Anemia, unspecified: Secondary | ICD-10-CM | POA: Diagnosis present

## 2016-02-05 DIAGNOSIS — G894 Chronic pain syndrome: Secondary | ICD-10-CM | POA: Diagnosis present

## 2016-02-05 HISTORY — DX: Pulmonary hypertension, unspecified: I27.20

## 2016-02-05 HISTORY — DX: Other forms of acute ischemic heart disease: I24.8

## 2016-02-05 LAB — HEPATIC FUNCTION PANEL
ALT: 39 U/L (ref 14–54)
AST: 180 U/L — ABNORMAL HIGH (ref 15–41)
Albumin: 2.9 g/dL — ABNORMAL LOW (ref 3.5–5.0)
Alkaline Phosphatase: 131 U/L — ABNORMAL HIGH (ref 38–126)
BILIRUBIN DIRECT: 5.9 mg/dL — AB (ref 0.1–0.5)
BILIRUBIN INDIRECT: 9.6 mg/dL — AB (ref 0.3–0.9)
TOTAL PROTEIN: 7.5 g/dL (ref 6.5–8.1)
Total Bilirubin: 15.5 mg/dL — ABNORMAL HIGH (ref 0.3–1.2)

## 2016-02-05 LAB — CBC WITH DIFFERENTIAL/PLATELET
BASOS PCT: 1 %
Basophils Absolute: 0.1 10*3/uL (ref 0.0–0.1)
EOS PCT: 0 %
Eosinophils Absolute: 0 10*3/uL (ref 0.0–0.7)
HCT: 17.8 % — ABNORMAL LOW (ref 36.0–46.0)
Hemoglobin: 6.8 g/dL — CL (ref 12.0–15.0)
LYMPHS ABS: 2.8 10*3/uL (ref 0.7–4.0)
Lymphocytes Relative: 21 %
MCH: 35.6 pg — AB (ref 26.0–34.0)
MCHC: 38.2 g/dL — AB (ref 30.0–36.0)
MCV: 93.2 fL (ref 78.0–100.0)
MONO ABS: 2.6 10*3/uL — AB (ref 0.1–1.0)
Monocytes Relative: 20 %
NEUTROS ABS: 7.6 10*3/uL (ref 1.7–7.7)
NEUTROS PCT: 58 %
PLATELETS: ADEQUATE 10*3/uL (ref 150–400)
RBC: 1.91 MIL/uL — ABNORMAL LOW (ref 3.87–5.11)
RDW: 23.6 % — ABNORMAL HIGH (ref 11.5–15.5)
WBC: 13.1 10*3/uL — ABNORMAL HIGH (ref 4.0–10.5)

## 2016-02-05 LAB — BASIC METABOLIC PANEL
Anion gap: 5 (ref 5–15)
BUN: 9 mg/dL (ref 6–20)
CALCIUM: 8.2 mg/dL — AB (ref 8.9–10.3)
CO2: 22 mmol/L (ref 22–32)
Chloride: 106 mmol/L (ref 101–111)
GLUCOSE: 100 mg/dL — AB (ref 65–99)
Potassium: 4.5 mmol/L (ref 3.5–5.1)
Sodium: 133 mmol/L — ABNORMAL LOW (ref 135–145)

## 2016-02-05 LAB — URINALYSIS, ROUTINE W REFLEX MICROSCOPIC
Glucose, UA: NEGATIVE mg/dL
KETONES UR: NEGATIVE mg/dL
LEUKOCYTES UA: NEGATIVE
NITRITE: POSITIVE — AB
PH: 6 (ref 5.0–8.0)
Specific Gravity, Urine: 1.02 (ref 1.005–1.030)

## 2016-02-05 LAB — RETICULOCYTES
RBC.: 1.91 MIL/uL — ABNORMAL LOW (ref 3.87–5.11)
Retic Ct Pct: >23 % — ABNORMAL HIGH (ref 0.4–3.1)

## 2016-02-05 LAB — URINE MICROSCOPIC-ADD ON

## 2016-02-05 LAB — SEDIMENTATION RATE: SED RATE: 35 mm/h — AB (ref 0–22)

## 2016-02-05 MED ORDER — DEXTROSE 5 % IV SOLN
1.0000 g | INTRAVENOUS | Status: DC
  Filled 2016-02-05: qty 10

## 2016-02-05 MED ORDER — FOLIC ACID 1 MG PO TABS: 1.0000 mg | ORAL_TABLET | Freq: Every day | ORAL | Status: DC

## 2016-02-05 MED ORDER — ACETAMINOPHEN 650 MG RE SUPP: 650.0000 mg | Freq: Four times a day (QID) | RECTAL | Status: DC | PRN

## 2016-02-05 MED ORDER — DIPHENHYDRAMINE HCL 50 MG/ML IJ SOLN
25.0000 mg | Freq: Once | INTRAMUSCULAR | Status: AC
  Administered 2016-02-05: 25 mg via INTRAVENOUS
  Filled 2016-02-05: qty 1

## 2016-02-05 MED ORDER — HYDROMORPHONE HCL 2 MG/ML IJ SOLN
2.0000 mg | Freq: Once | INTRAMUSCULAR | Status: DC
  Filled 2016-02-05: qty 1

## 2016-02-05 MED ORDER — ONDANSETRON HCL 4 MG/2ML IJ SOLN
4.0000 mg | Freq: Four times a day (QID) | INTRAMUSCULAR | Status: DC | PRN
  Administered 2016-02-07 – 2016-02-10 (×2): 4 mg via INTRAVENOUS
  Filled 2016-02-05 (×2): qty 2

## 2016-02-05 MED ORDER — HYDROXYUREA 500 MG PO CAPS
1000.0000 mg | ORAL_CAPSULE | Freq: Every day | ORAL | Status: DC
  Administered 2016-02-07 – 2016-02-15 (×9): 1000 mg via ORAL
  Filled 2016-02-05 (×9): qty 2

## 2016-02-05 MED ORDER — HYDROMORPHONE HCL 2 MG/ML IJ SOLN
2.0000 mg | INTRAMUSCULAR | Status: DC
  Administered 2016-02-05 – 2016-02-07 (×17): 2 mg via INTRAVENOUS
  Filled 2016-02-05 (×18): qty 1

## 2016-02-05 MED ORDER — DEXTROSE 5 % IV SOLN
1.0000 g | Freq: Once | INTRAVENOUS | Status: AC
  Administered 2016-02-05: 1 g via INTRAVENOUS
  Filled 2016-02-05: qty 10

## 2016-02-05 MED ORDER — LORATADINE 10 MG PO TABS
10.0000 mg | ORAL_TABLET | Freq: Every day | ORAL | Status: DC
  Administered 2016-02-06 – 2016-02-15 (×10): 10 mg via ORAL
  Filled 2016-02-05 (×10): qty 1

## 2016-02-05 MED ORDER — DIPHENHYDRAMINE HCL 50 MG/ML IJ SOLN
25.0000 mg | Freq: Four times a day (QID) | INTRAMUSCULAR | Status: DC | PRN
  Administered 2016-02-06 – 2016-02-07 (×2): 25 mg via INTRAVENOUS
  Filled 2016-02-05 (×2): qty 1

## 2016-02-05 MED ORDER — HYDROMORPHONE HCL 1 MG/ML IJ SOLN
INTRAMUSCULAR | Status: AC
  Administered 2016-02-05: 1 mg via INTRAVENOUS
  Filled 2016-02-05: qty 1

## 2016-02-05 MED ORDER — LORATADINE 10 MG PO TABS: 10.0000 mg | ORAL_TABLET | Freq: Every day | ORAL | Status: DC

## 2016-02-05 MED ORDER — PROMETHAZINE HCL 12.5 MG PO TABS
25.0000 mg | ORAL_TABLET | Freq: Four times a day (QID) | ORAL | Status: DC | PRN
  Administered 2016-02-06 – 2016-02-10 (×3): 25 mg via ORAL
  Filled 2016-02-05 (×3): qty 2

## 2016-02-05 MED ORDER — DEXTROSE 5 % IV SOLN: 1.0000 g | INTRAVENOUS | Status: DC

## 2016-02-05 MED ORDER — OXYCODONE HCL 5 MG PO TABS
10.0000 mg | ORAL_TABLET | ORAL | Status: DC | PRN
  Administered 2016-02-05 – 2016-02-06 (×3): 10 mg via ORAL
  Filled 2016-02-05 (×3): qty 2

## 2016-02-05 MED ORDER — SODIUM CHLORIDE 0.9 % IV SOLN
INTRAVENOUS | Status: AC
  Administered 2016-02-05 – 2016-02-06 (×2): via INTRAVENOUS

## 2016-02-05 MED ORDER — PROMETHAZINE HCL 12.5 MG PO TABS
25.0000 mg | ORAL_TABLET | Freq: Once | ORAL | Status: AC
  Administered 2016-02-05: 25 mg via ORAL
  Filled 2016-02-05: qty 2

## 2016-02-05 MED ORDER — ACETAMINOPHEN 325 MG PO TABS
650.0000 mg | ORAL_TABLET | Freq: Four times a day (QID) | ORAL | Status: DC | PRN
  Administered 2016-02-06: 650 mg via ORAL
  Filled 2016-02-05: qty 2

## 2016-02-05 MED ORDER — HYDROMORPHONE HCL 2 MG/ML IJ SOLN
2.0000 mg | Freq: Once | INTRAMUSCULAR | Status: AC
  Administered 2016-02-05: 2 mg via INTRAVENOUS

## 2016-02-05 MED ORDER — SODIUM CHLORIDE 0.9% FLUSH
3.0000 mL | Freq: Two times a day (BID) | INTRAVENOUS | Status: DC
  Administered 2016-02-08 – 2016-02-15 (×9): 3 mL via INTRAVENOUS

## 2016-02-05 MED ORDER — SODIUM CHLORIDE 0.9 % IV BOLUS (SEPSIS)
1000.0000 mL | Freq: Once | INTRAVENOUS | Status: AC
  Administered 2016-02-05: 1000 mL via INTRAVENOUS

## 2016-02-05 MED ORDER — ACETAMINOPHEN 500 MG PO TABS
1000.0000 mg | ORAL_TABLET | Freq: Once | ORAL | Status: AC
  Administered 2016-02-05: 1000 mg via ORAL

## 2016-02-05 MED ORDER — ACETAMINOPHEN 500 MG PO TABS
ORAL_TABLET | ORAL | Status: AC
  Filled 2016-02-05: qty 2

## 2016-02-05 MED ORDER — VITAMIN D (ERGOCALCIFEROL) 1.25 MG (50000 UNIT) PO CAPS
50000.0000 [IU] | ORAL_CAPSULE | ORAL | Status: DC
  Administered 2016-02-12: 50000 [IU] via ORAL
  Filled 2016-02-05: qty 1

## 2016-02-05 MED ORDER — HYDROCODONE-ACETAMINOPHEN 5-325 MG PO TABS
1.0000 | ORAL_TABLET | Freq: Once | ORAL | Status: AC
  Administered 2016-02-05: 1 via ORAL
  Filled 2016-02-05: qty 1

## 2016-02-05 MED ORDER — HYDROMORPHONE HCL 1 MG/ML IJ SOLN
1.0000 mg | Freq: Once | INTRAMUSCULAR | Status: AC
  Administered 2016-02-05: 1 mg via INTRAVENOUS

## 2016-02-05 MED ORDER — FOLIC ACID 1 MG PO TABS
1.0000 mg | ORAL_TABLET | Freq: Every day | ORAL | Status: DC
  Administered 2016-02-06 – 2016-02-15 (×10): 1 mg via ORAL
  Filled 2016-02-05 (×10): qty 1

## 2016-02-05 MED ORDER — SODIUM CHLORIDE 0.9 % IV SOLN: INTRAVENOUS | Status: AC

## 2016-02-05 NOTE — ED Notes (Signed)
Pt placed on 2L 02 [place] per Dr. Lacinda Axon order after 02 sats in 80's.

## 2016-02-05 NOTE — ED Provider Notes (Addendum)
CSN: [id number]     Arrival date & time 02/05/16  1036 History  By signing my name below, I, Rayna Sexton, attest that this documentation has been prepared under the direction and in the presence of Nat Christen, MD. Electronically Signed: Rayna Sexton, ED Scribe. 02/05/2016. 11:10 AM.   Chief Complaint  Patient presents with  . Sickle Cell Pain Crisis   The history is provided by the patient. No language interpreter was used.    HPI Comments: Dawn [name] is a 35 y.o. female with a PMHx of sickle cell disease type S who presents to the Emergency Department due to worsening, diffuse, moderate, pain to her extremities and back x 3 days. She reports an associated, moderate, fever (103.2 F in triage) and nausea. She denies having taken anything for her fever PTA due to not being aware of it. Pt was seen yesterday for sickle cell associated pain and was d/c in stable condition. Pt was admitted at the end of April 2017 due to a sickle cell crisis. She confirms taking phenergan at home for nausea which typically provides relief. She confirms her listed PCP at the [facility] in [place]. She denies cough, difficulty urinating or any other associated symptoms at this time.    Past Medical History  Diagnosis Date  . Sickle cell disease ([place])   . Sickle cell disease, type S ([place])   . Blood transfusion   . 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 HCAP (healthcare-associated pneumonia) 05/19/2013  . Sickle cell anemia ([facility])   . 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 Vitamin B12 deficiency 07/07/2015   Past Surgical History  Procedure Laterality Date  . Cholecystectomy    .  left knee acl reconstruction    . Cesarean section      x 2  . Portacath placement    . Portacath placement Left 02/23/2013    Procedure: INSERTION PORT-A-CATH;  Surgeon: Donato Heinz, MD;  Location: [facility];  Service: General;  Laterality: Left;  . Port-a-cath removal Left 07/29/2013    Procedure: REMOVAL  PORT-A-CATH;  Surgeon: Scherry Ran, MD;  Location: [facility];  Service: General;  Laterality: Left;  . Tee without cardioversion N/A 08/07/2013    Procedure: TRANSESOPHAGEAL ECHOCARDIOGRAM (TEE);  Surgeon: Arnoldo Lenis, MD;  Location[facility];  Service: Cardiology;  Laterality: N/A;   Family History  Problem Relation Age of Onset  . Sickle cell trait Mother   . Sickle cell trait Father   . Hypertension Father   . Diabetes Father   . Sickle cell trait Sister   . Cancer Paternal Aunt     Breast   Social History  Substance Use Topics  . Smoking status: Never Smoker   . Smokeless tobacco: Never Used  . Alcohol Use: No   OB History    Gravida Para Term Preterm AB TAB SAB Ectopic Multiple Living   1 1  1      1      Review of Systems  Constitutional: Positive for fever and fatigue.  Respiratory: Negative for cough.   Gastrointestinal: Positive for nausea.  Genitourinary: Negative for difficulty urinating.  Musculoskeletal: Positive for myalgias and back pain.  All other systems reviewed and are negative.   Allergies  Desferal; Latex; Lisinopril; and Tape  Home Medications   Prior to Admission medications   Medication Sig Start Date End Date Taking? Authorizing Provider  folic acid (FOLVITE) 1 MG tablet Take 1 tablet (1 mg total) by  mouth daily. 08/16/15  Yes [name], FNP  hydroxyurea (HYDREA) 500 MG capsule Take 2 capsules (1,000 mg total) by mouth daily. May take with food to minimize GI side effects. 09/05/15  Yes [name], FNP  loratadine (CLARITIN) 10 MG tablet Take 10 mg by mouth daily.   Yes Historical Provider, MD  morphine (MS CONTIN) 15 MG 12 hr tablet Take 1 tablet (15 mg total) by mouth every 12 (twelve) hours. 10/06/15  Yes [name], FNP  Multiple Vitamin (MULTIVITAMIN WITH MINERALS) TABS tablet Take 1 tablet by mouth daily.   Yes Historical Provider, MD  Vitamin D, Ergocalciferol, (DRISDOL) 50000 UNITS CAPS capsule Take 1  capsule (50,000 Units total) by mouth every 7 (seven) days. Takes on Sundays. 08/16/15  Yes [name], FNP  acetaminophen (TYLENOL) 500 MG tablet Take 500 mg by mouth every 6 (six) hours as needed for mild pain.     Historical Provider, MD  cyclobenzaprine (FLEXERIL) 10 MG tablet Take 1 tablet (10 mg total) by mouth 3 (three) times daily as needed for muscle spasms. Patient not taking: Reported on 12/31/2015 12/14/15   [name], MD  hydrOXYzine (ATARAX/VISTARIL) 10 MG tablet Take 1 tablet (10 mg total) by mouth 3 (three) times daily as needed for itching. Patient not taking: Reported on 01/30/2016 12/14/15   [name], MD  ibuprofen (ADVIL,MOTRIN) 200 MG tablet Take 200 mg by mouth every 6 (six) hours as needed for mild pain or moderate pain.    Historical Provider, MD  levonorgestrel (MIRENA) 20 MCG/24HR IUD 1 each by Intrauterine route once.    Historical Provider, MD  Oxycodone HCl 10 MG TABS Take 1 tablet (10 mg total) by mouth every 6 (six) hours as needed. Patient taking differently: Take 10 mg by mouth every 4 (four) hours as needed (breakthrough pain).  10/06/15   [name], FNP  polyethylene glycol (MIRALAX / GLYCOLAX) packet Take 17 g by mouth daily as needed for mild constipation. Patient not taking: Reported on 01/30/2016 01/15/16   Erline Hau, MD  promethazine (PHENERGAN) 25 MG tablet Take 1 tablet (25 mg total) by mouth every 6 (six) hours as needed for nausea or vomiting. 12/07/15   [name], MD   BP 131/87 mmHg  Pulse 103  Temp(Src) 99.3 F (37.4 C) (Axillary)  Resp 15  Wt 167 lb (75.751 kg)  SpO2 92%    Physical Exam  Constitutional: She is oriented to person, place, and time. She appears well-developed and well-nourished.  HENT:  Head: Normocephalic and atraumatic.  Eyes: Conjunctivae and EOM are normal. Pupils are equal, round, and reactive to light.  Neck: Normal range of motion. Neck supple.  Cardiovascular: Normal rate and regular rhythm.    Pulmonary/Chest: Effort normal and breath sounds normal.  Abdominal: Soft. Bowel sounds are normal.  Musculoskeletal: Normal range of motion.  Neurological: She is alert and oriented to person, place, and time.  Skin: Skin is warm and dry.  Psychiatric: She has a normal mood and affect. Her behavior is normal.  Nursing note and vitals reviewed.   ED Course  Procedures  DIAGNOSTIC STUDIES: Oxygen Saturation is 92% on RA, adequate by my interpretation.    COORDINATION OF CARE: 11:08 AM Discussed next steps with pt including UA, CXR, labs and IV pain medications. She verbalized understanding and is agreeable with the plan.   Labs Review Labs Reviewed  CBC WITH DIFFERENTIAL/PLATELET - Abnormal; Notable for the following:  WBC 13.1 (*)    RBC 1.91 (*)    Hemoglobin 6.8 (*)    HCT 17.8 (*)    MCH 35.6 (*)    MCHC 38.2 (*)    RDW 23.6 (*)    Monocytes Absolute 2.6 (*)    All other components within normal limits  BASIC METABOLIC PANEL - Abnormal; Notable for the following:    Sodium 133 (*)    Glucose, Bld 100 (*)    Creatinine, Ser <0.30 (*)    Calcium 8.2 (*)    All other components within normal limits  RETICULOCYTES - Abnormal; Notable for the following:    Retic Ct Pct >23.0 (*)    RBC. 1.91 (*)    All other components within normal limits  URINALYSIS, ROUTINE W REFLEX MICROSCOPIC (NOT AT [facility]) - Abnormal; Notable for the following:    Color, Urine BROWN (*)    APPearance HAZY (*)    Hgb urine dipstick LARGE (*)    Bilirubin Urine LARGE (*)    Protein, ur >300 (*)    Nitrite POSITIVE (*)    All other components within normal limits  URINE MICROSCOPIC-ADD ON - Abnormal; Notable for the following:    Squamous Epithelial / LPF 6-30 (*)    Bacteria, UA MANY (*)    All other components within normal limits  URINE CULTURE  CULTURE, BLOOD (ROUTINE X 2)  CULTURE, BLOOD (ROUTINE X 2)  TYPE AND SCREEN    Imaging Review Dg Chest 2 View  02/05/2016  CLINICAL DATA:   Fever, sickle cell disease EXAM: CHEST  2 VIEW COMPARISON:  01/12/2016 FINDINGS: Cardiomediastinal silhouette is stable. No acute infiltrate or pleural effusion. No pulmonary edema. Bony thorax is unremarkable. IMPRESSION: No active cardiopulmonary disease. Electronically Signed   By: Lahoma Crocker M.D.   On: 02/05/2016 14:51   I have personally reviewed and evaluated these images and lab results as part of my medical decision-making.   EKG Interpretation None     CRITICAL CARE Performed by: Nat Christen Total critical care time: 30 minutes Critical care time was exclusive of separately billable procedures and treating other patients. Critical care was necessary to treat or prevent imminent or life-threatening deterioration. Critical care was time spent personally by me on the following activities: development of treatment plan with patient and/or surrogate as well as nursing, discussions with consultants, evaluation of patient's response to treatment, examination of patient, obtaining history from patient or surrogate, ordering and performing treatments and interventions, ordering and review of laboratory studies, ordering and review of radiographic studies, pulse oximetry and re-evaluation of patient's condition. MDM   Final diagnoses:  Sickle cell disease with crisis ([place])  UTI (lower urinary tract infection)  Fever, unspecified fever cause    Patient was initially tachycardic and febrile. Hemoglobin has dropped to 6.8.  White count 13.1.   She feels better after IV fluids and pain management. Chest x-ray showed no pneumonia. Urinalysis revealed evidence of infection. IV Rocephin. Urine culture. Blood culture. Admit to general medicine.  I personally performed the services described in this documentation, which was scribed in my presence. The recorded information has been reviewed and is accurate.     Nat Christen, MD 02/05/16 1518  Nat Christen, MD 02/05/16 [phone]

## 2016-02-05 NOTE — ED Notes (Signed)
Pt seen here on Thursday for sickle cell pain, pt reports increased pain generalized.

## 2016-02-05 NOTE — H&P (Addendum)
TRH H&P   Patient Demographics:    Dawn [name], is a 35 y.o. female  MRN: [id number]   DOB - 1980/11/03  Admit Date - 02/05/2016  Outpatient Primary MD for the patient is Angelica Chessman, MD  Referring MD/NP/PA:  Nat Christen  Outpatient Specialists: Arther Abbott  Patient coming from: home  Chief Complaint  Patient presents with  . Sickle Cell Pain Crisis      HPI:    Dawn [name]  is a 35 y.o. female, w hx of sickle cell anemia apparently had fever 103.3 at home , and therefore presented to the ED. Pt states that her elbows, and lower back have been bothering her more recently.  "common place for me to hurt when I have sickle cell crisis".  CXR was negative for source of fever. ua showed many bacteria but wbc 0-5.  Pt denies dysuria , frequency of urination, sinus pain, sore throat, cough, cp, palp, sob, n/v, diarrhea, abd pain, brbpr, black stool.   Pt was noted to be anemic, hgb 6.8 in the ED.  Pt will be admitted for w/up of fever and sickle cell crisis.      Review of systems:    In addition to the HPI above, + Fever No Headache, No changes with Vision or hearing, No problems swallowing food or Liquids, No Chest pain, Cough or Shortness of Breath, No Abdominal pain, No Nausea or Vommitting, Bowel movements are regular, No Blood in stool or Urine, No dysuria, No new skin rashes or bruises, + joints pains-aches,  No new weakness, tingling, numbness in any extremity, No recent weight gain or loss, No polyuria, polydypsia or polyphagia, No significant Mental Stressors.  A full 10 point Review of Systems was done, except as stated above, all other Review of Systems were negative.   With Past History of the following :    Past Medical History  Diagnosis Date  . Sickle cell disease ([place])   . Sickle cell disease, type S ([place])   . Blood transfusion   . Reactive  depression (situational) 03/28/2012  . HCAP (healthcare-associated pneumonia) 05/19/2013  . Sickle cell anemia ([facility])   . 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 Vitamin B12 deficiency 07/07/2015      Past Surgical History  Procedure Laterality Date  . Cholecystectomy    .  left knee acl reconstruction    . Cesarean section      x 2  . Portacath placement    . Portacath placement Left 02/23/2013    Procedure: INSERTION PORT-A-CATH;  Surgeon: Donato Heinz, MD;  Location: [facility];  Service: General;  Laterality: Left;  . Port-a-cath removal Left 07/29/2013    Procedure: REMOVAL PORT-A-CATH;  Surgeon: Scherry Ran, MD;  Location: [facility];  Service: General;  Laterality: Left;  . Tee without cardioversion N/A 08/07/2013    Procedure: TRANSESOPHAGEAL ECHOCARDIOGRAM (TEE);  Surgeon: Arnoldo Lenis, MD;  Location: [facility];  Service: Cardiology;  Laterality: N/A;      Social History:     Social History  Substance Use Topics  . Smoking status: Never Smoker   . Smokeless tobacco: Never Used  . Alcohol Use: No     Lives - at home  Mobility - ambulates without assistance typically     Family History :     Family History  Problem Relation Age of Onset  . Sickle cell trait Mother   . Sickle cell trait Father   . Hypertension Father   . Diabetes Father   . Sickle cell trait Sister   . Cancer Paternal Aunt     Breast      Home Medications:   Prior to Admission medications   Medication Sig Start Date End Date Taking? Authorizing Provider  folic acid (FOLVITE) 1 MG tablet Take 1 tablet (1 mg total) by mouth daily. 08/16/15  Yes [name], FNP  hydroxyurea (HYDREA) 500 MG capsule Take 2 capsules (1,000 mg total) by mouth daily. May take with food to minimize GI side effects. 09/05/15  Yes [name], FNP  loratadine (CLARITIN) 10 MG tablet Take 10 mg by mouth daily.   Yes Historical Provider, MD  morphine (MS CONTIN) 15 MG 12 hr tablet Take 1 tablet (15 mg  total) by mouth every 12 (twelve) hours. 10/06/15  Yes [name], FNP  Multiple Vitamin (MULTIVITAMIN WITH MINERALS) TABS tablet Take 1 tablet by mouth daily.   Yes Historical Provider, MD  Vitamin D, Ergocalciferol, (DRISDOL) 50000 UNITS CAPS capsule Take 1 capsule (50,000 Units total) by mouth every 7 (seven) days. Takes on Sundays. 08/16/15  Yes [name], FNP  acetaminophen (TYLENOL) 500 MG tablet Take 500 mg by mouth every 6 (six) hours as needed for mild pain.     Historical Provider, MD  cyclobenzaprine (FLEXERIL) 10 MG tablet Take 1 tablet (10 mg total) by mouth 3 (three) times daily as needed for muscle spasms. Patient not taking: Reported on 12/31/2015 12/14/15   [name], MD  hydrOXYzine (ATARAX/VISTARIL) 10 MG tablet Take 1 tablet (10 mg total) by mouth 3 (three) times daily as needed for itching. Patient not taking: Reported on 01/30/2016 12/14/15   [name], MD  ibuprofen (ADVIL,MOTRIN) 200 MG tablet Take 200 mg by mouth every 6 (six) hours as needed for mild pain or moderate pain.    Historical Provider, MD  levonorgestrel (MIRENA) 20 MCG/24HR IUD 1 each by Intrauterine route once.    Historical Provider, MD  Oxycodone HCl 10 MG TABS Take 1 tablet (10 mg total) by mouth every 6 (six) hours as needed. Patient taking differently: Take 10 mg by mouth every 4 (four) hours as needed (breakthrough pain).  10/06/15   [name], FNP  polyethylene glycol (MIRALAX / GLYCOLAX) packet Take 17 g by mouth daily as needed for mild constipation. Patient not taking: Reported on 01/30/2016 01/15/16   Erline Hau, MD  promethazine (PHENERGAN) 25 MG tablet Take 1 tablet (25 mg total) by mouth every 6 (six) hours as needed for nausea or vomiting. 12/07/15   [name], MD     Allergies:     Allergies  Allergen Reactions  . Desferal [Deferoxamine] Hives    Local reaction on arm only during infusion. Can take with benadryl   . Latex Other (See Comments)    REACTION: Pt  experiences a burning sensation  on contacted skin areas  . Lisinopril Other (See Comments) and Cough    REACTION: Sore/scratchy throat  . Tape Other (See Comments)    REACTION: Pt. Experiences a burning sensation on contacted skin areas     Physical Exam:   Vitals  Blood pressure 131/87, pulse 103, temperature 99.3 F (37.4 C), temperature source Axillary, resp. rate 15, weight 75.751 kg (167 lb), SpO2 92 %.   1. General young african american female  lying in bed in NAD,   2. Normal affect and insight, Not Suicidal or Homicidal, Awake Alert, Oriented X 3.  3. No F.N deficits, ALL C.Nerves Intact, Strength 5/5 all 4 extremities, Sensation intact all 4 extremities, Plantars down going.  4. Ears and Eyes appear Normal, Conjunctivae clear, PERRLA. Moist Oral Mucosa.  5. Supple Neck, No JVD, No cervical lymphadenopathy appriciated, No Carotid Bruits.  6. Symmetrical Chest wall movement, Good air movement bilaterally, CTAB.  7. Borderline tachy, No Gallops, Rubs.  2/6 sem apex, No Parasternal Heave.  8. Positive Bowel Sounds, Abdomen Soft, No tenderness, No organomegaly appriciated,No rebound -guarding or rigidity.  9.  No Cyanosis, Normal Skin Turgor, No Skin Rash or Bruise.  10. Good muscle tone,  joints appear normal , no effusions, Normal ROM.  11. No Palpable Lymph Nodes in Neck or Axillae, no tenderness of the lower back     Data Review:    CBC  Recent Labs Lab 01/30/16 0830 02/04/16 0449 02/05/16 1130  WBC 7.3 8.7 13.1*  HGB 8.3* 7.6* 6.8*  HCT 23.0* 21.4* 17.8*  PLT PLATELET CLUMPS NOTED ON SMEAR, COUNT APPEARS ADEQUATE 189 PLATELET CLUMPS NOTED ON SMEAR, COUNT APPEARS ADEQUATE  MCV 99.6 99.1 93.2  MCH 35.9* 35.2* 35.6*  MCHC 36.1* 35.5 38.2*  RDW 18.6* 19.3* 23.6*  LYMPHSABS 2.1 2.5 2.8  MONOABS 1.3* 1.5* 2.6*  EOSABS 0.1 0.2 0.0  BASOSABS 0.0 0.1 0.1    ------------------------------------------------------------------------------------------------------------------  Chemistries   Recent Labs Lab 01/30/16 0830 02/04/16 0449 02/05/16 1130  NA 137 134* 133*  K 3.6 3.4* 4.5  CL 106 107 106  CO2 _0 GLUCOSE 108* 111* 100*  BUN 5* 8 9  CREATININE 0.36* 0.33* <0.30*  CALCIUM 8.4* 8.3* 8.2*  AST 83* 87*  --   ALT 28 30  --   ALKPHOS 135* 138*  --   BILITOT 9.3* 11.5*  --    ------------------------------------------------------------------------------------------------------------------ CrCl cannot be calculated (Patient has no serum creatinine result on file.). ------------------------------------------------------------------------------------------------------------------ No results for input(s): TSH, T4TOTAL, T3FREE, THYROIDAB in the last 72 hours.  Invalid input(s): FREET3  Coagulation profile No results for input(s): INR, PROTIME in the last 168 hours. ------------------------------------------------------------------------------------------------------------------- No results for input(s): DDIMER in the last 72 hours. -------------------------------------------------------------------------------------------------------------------  Cardiac Enzymes No results for input(s): CKMB, TROPONINI, MYOGLOBIN in the last 168 hours.  Invalid input(s): CK ------------------------------------------------------------------------------------------------------------------ No results found for: BNP   ---------------------------------------------------------------------------------------------------------------  Urinalysis    Component Value Date/Time   COLORURINE BROWN* 02/05/2016 1323   APPEARANCEUR HAZY* 02/05/2016 1323   LABSPEC 1.020 02/05/2016 1323   PHURINE 6.0 02/05/2016 1323   GLUCOSEU NEGATIVE 02/05/2016 1323   HGBUR LARGE* 02/05/2016 1323   BILIRUBINUR LARGE* 02/05/2016 1323   KETONESUR NEGATIVE 02/05/2016  1323   PROTEINUR >300* 02/05/2016 1323   UROBILINOGEN 1.0 09/05/2015 0917   NITRITE POSITIVE* 02/05/2016 1323   LEUKOCYTESUR NEGATIVE 02/05/2016 1323    ----------------------------------------------------------------------------------------------------------------   Imaging Results:    Dg Chest 2 View  02/05/2016  CLINICAL DATA:  Fever, sickle cell disease EXAM: CHEST  2 VIEW COMPARISON:  01/12/2016 FINDINGS: Cardiomediastinal silhouette is stable. No acute infiltrate or pleural effusion. No pulmonary edema. Bony thorax is unremarkable. IMPRESSION: No active cardiopulmonary disease. Electronically Signed   By: Lahoma Crocker M.D.   On: 02/05/2016 14:51       Assessment & Plan:    Active Problems:   * No active hospital problems. *    1. Fever Unclear source Await blood culture check ESR, LFT Agree with tx with rocephin and awaiting urine culture.  If fever continues please check MRI L spine, cardiac echo.    2. Sickle cell crisis Dilaudid 80m iv q2h  NS iv  3. Anemia Type and screen, Check cbc in am, if hgb lower then transfuse   4. Tachycardia secondary to fever? Appears to have improved with hydration Telemetry, check tsh Check cardiac echo Check trop  5. Hyponatremia ? Secondary to pain Hydrate with ns iv  DVT Prophylaxis Lovenox  AM Labs Ordered, also please review Full Orders  Family Communication: Admission, patients condition and plan of care including tests being ordered have been discussed with the patient  who indicate understanding and agree with the plan and Code Status.  Code Status FULL CODE  Likely DC to  home  Condition GUARDED    Consults called: none  Admission status: obs   Time spent in minutes :40 minutes   JJani GravelM.D on 02/05/2016 at 3:29 PM  ([phone] Between 7am to 7pm - Pager - [phone] After 7pm go to [email or web address] - password [email or web address] - Office  [phone]

## 2016-02-05 NOTE — ED Notes (Signed)
CRITICAL VALUE ALERT  Critical value received:  Hgb 6.8  Date of notification:  02/05/16  Time of notification:  1205  Critical value read back:Yes.    Nurse who received alert:  RMinter, RN  MD notified (1st page):  Dr. Lacinda Axon  Time of first page:  1205  MD notified (2nd page):  Time of second page:  Responding MD:  Dr. Lacinda Axon  Time MD responded:  [phone]

## 2016-02-06 ENCOUNTER — Observation Stay (HOSPITAL_COMMUNITY): Payer: BLUE CROSS/BLUE SHIELD

## 2016-02-06 DIAGNOSIS — A419 Sepsis, unspecified organism: Principal | ICD-10-CM

## 2016-02-06 DIAGNOSIS — R509 Fever, unspecified: Secondary | ICD-10-CM | POA: Diagnosis present

## 2016-02-06 DIAGNOSIS — I2489 Other forms of acute ischemic heart disease: Secondary | ICD-10-CM

## 2016-02-06 DIAGNOSIS — E876 Hypokalemia: Secondary | ICD-10-CM | POA: Diagnosis present

## 2016-02-06 DIAGNOSIS — G894 Chronic pain syndrome: Secondary | ICD-10-CM | POA: Diagnosis present

## 2016-02-06 DIAGNOSIS — Z8249 Family history of ischemic heart disease and other diseases of the circulatory system: Secondary | ICD-10-CM | POA: Diagnosis not present

## 2016-02-06 DIAGNOSIS — R000 Tachycardia, unspecified: Secondary | ICD-10-CM

## 2016-02-06 DIAGNOSIS — Z833 Family history of diabetes mellitus: Secondary | ICD-10-CM | POA: Diagnosis not present

## 2016-02-06 DIAGNOSIS — D696 Thrombocytopenia, unspecified: Secondary | ICD-10-CM | POA: Diagnosis present

## 2016-02-06 DIAGNOSIS — D57219 Sickle-cell/Hb-C disease with crisis, unspecified: Secondary | ICD-10-CM | POA: Diagnosis not present

## 2016-02-06 DIAGNOSIS — N390 Urinary tract infection, site not specified: Secondary | ICD-10-CM | POA: Diagnosis present

## 2016-02-06 DIAGNOSIS — D5700 Hb-SS disease with crisis, unspecified: Secondary | ICD-10-CM | POA: Diagnosis present

## 2016-02-06 DIAGNOSIS — Z809 Family history of malignant neoplasm, unspecified: Secondary | ICD-10-CM | POA: Diagnosis not present

## 2016-02-06 DIAGNOSIS — I272 Pulmonary hypertension, unspecified: Secondary | ICD-10-CM

## 2016-02-06 DIAGNOSIS — I214 Non-ST elevation (NSTEMI) myocardial infarction: Secondary | ICD-10-CM | POA: Diagnosis not present

## 2016-02-06 DIAGNOSIS — I248 Other forms of acute ischemic heart disease: Secondary | ICD-10-CM | POA: Diagnosis present

## 2016-02-06 DIAGNOSIS — D649 Anemia, unspecified: Secondary | ICD-10-CM | POA: Diagnosis not present

## 2016-02-06 HISTORY — DX: Other forms of acute ischemic heart disease: I24.89

## 2016-02-06 HISTORY — DX: Other forms of acute ischemic heart disease: I24.8

## 2016-02-06 HISTORY — DX: Pulmonary hypertension, unspecified: I27.20

## 2016-02-06 LAB — ECHOCARDIOGRAM COMPLETE
HEIGHTINCHES: 63 in
Weight: 2716.8 oz

## 2016-02-06 LAB — CBC WITH DIFFERENTIAL/PLATELET
Basophils Absolute: 0.1 10*3/uL (ref 0.0–0.1)
Basophils Relative: 1 %
EOS ABS: 0 10*3/uL (ref 0.0–0.7)
Eosinophils Relative: 0 %
HCT: 14.3 % — ABNORMAL LOW (ref 36.0–46.0)
HEMOGLOBIN: 5.5 g/dL — AB (ref 12.0–15.0)
LYMPHS ABS: 2.6 10*3/uL (ref 0.7–4.0)
Lymphocytes Relative: 19 %
MCH: 35.5 pg — AB (ref 26.0–34.0)
MCHC: 38.5 g/dL — AB (ref 30.0–36.0)
MCV: 92.3 fL (ref 78.0–100.0)
MONO ABS: 2.5 10*3/uL — AB (ref 0.1–1.0)
MONOS PCT: 18 %
NEUTROS PCT: 63 %
Neutro Abs: 8.8 10*3/uL — ABNORMAL HIGH (ref 1.7–7.7)
RBC: 1.55 MIL/uL — AB (ref 3.87–5.11)
WBC: 13.9 10*3/uL — AB (ref 4.0–10.5)

## 2016-02-06 LAB — COMPREHENSIVE METABOLIC PANEL
ALK PHOS: 114 U/L (ref 38–126)
ALT: 34 U/L (ref 14–54)
AST: 145 U/L — ABNORMAL HIGH (ref 15–41)
Albumin: 2.6 g/dL — ABNORMAL LOW (ref 3.5–5.0)
Anion gap: 6 (ref 5–15)
BUN: 11 mg/dL (ref 6–20)
CALCIUM: 7.9 mg/dL — AB (ref 8.9–10.3)
CO2: 20 mmol/L — ABNORMAL LOW (ref 22–32)
CREATININE: 0.32 mg/dL — AB (ref 0.44–1.00)
Chloride: 111 mmol/L (ref 101–111)
Glucose, Bld: 96 mg/dL (ref 65–99)
Potassium: 3.4 mmol/L — ABNORMAL LOW (ref 3.5–5.1)
Sodium: 137 mmol/L (ref 135–145)
TOTAL PROTEIN: 6.9 g/dL (ref 6.5–8.1)
Total Bilirubin: 17.1 mg/dL — ABNORMAL HIGH (ref 0.3–1.2)

## 2016-02-06 LAB — TROPONIN I
TROPONIN I: 1.14 ng/mL — AB (ref <0.031)
TROPONIN I: 0.96 ng/mL — AB (ref <0.031)

## 2016-02-06 LAB — PREPARE RBC (CROSSMATCH)

## 2016-02-06 LAB — PROCALCITONIN: Procalcitonin: 1.81 ng/mL

## 2016-02-06 LAB — LACTIC ACID, PLASMA
LACTIC ACID, VENOUS: 2.3 mmol/L — AB (ref 0.5–2.0)
Lactic Acid, Venous: 1.2 mmol/L (ref 0.5–2.0)

## 2016-02-06 LAB — PROTIME-INR
INR: 1.49 (ref 0.00–1.49)
Prothrombin Time: 18.1 seconds — ABNORMAL HIGH (ref 11.6–15.2)

## 2016-02-06 LAB — APTT: APTT: 38 s — AB (ref 24–37)

## 2016-02-06 MED ORDER — SODIUM CHLORIDE 0.9 % IV SOLN: Freq: Once | INTRAVENOUS | Status: DC

## 2016-02-06 MED ORDER — ASPIRIN 325 MG PO TABS
325.0000 mg | ORAL_TABLET | Freq: Every day | ORAL | Status: DC
Start: 2016-02-06 — End: 2016-02-15
  Administered 2016-02-07 – 2016-02-15 (×9): 325 mg via ORAL
  Filled 2016-02-06 (×9): qty 1

## 2016-02-06 MED ORDER — ACETAMINOPHEN 325 MG PO TABS
650.0000 mg | ORAL_TABLET | Freq: Once | ORAL | Status: AC
  Administered 2016-02-06: 650 mg via ORAL
  Filled 2016-02-06: qty 2

## 2016-02-06 MED ORDER — SODIUM CHLORIDE 0.9 % IV BOLUS (SEPSIS)
500.0000 mL | Freq: Once | INTRAVENOUS | Status: AC
  Administered 2016-02-06: 500 mL via INTRAVENOUS

## 2016-02-06 MED ORDER — DIPHENHYDRAMINE HCL 25 MG PO CAPS
25.0000 mg | ORAL_CAPSULE | Freq: Once | ORAL | Status: AC
  Administered 2016-02-06: 25 mg via ORAL
  Filled 2016-02-06: qty 1

## 2016-02-06 MED ORDER — ASPIRIN 325 MG PO TABS
ORAL_TABLET | ORAL | Status: AC
  Administered 2016-02-06: 325 mg
  Filled 2016-02-06: qty 1

## 2016-02-06 MED ORDER — METOPROLOL TARTRATE 5 MG/5ML IV SOLN
2.5000 mg | Freq: Once | INTRAVENOUS | Status: AC
  Administered 2016-02-06: 2.5 mg via INTRAVENOUS

## 2016-02-06 MED ORDER — METOPROLOL TARTRATE 5 MG/5ML IV SOLN
INTRAVENOUS | Status: AC
  Administered 2016-02-06: 2.5 mg via INTRAVENOUS
  Filled 2016-02-06: qty 5

## 2016-02-06 MED ORDER — SODIUM CHLORIDE 0.45 % IV BOLUS
1000.0000 mL | Freq: Once | INTRAVENOUS | Status: AC
  Administered 2016-02-06: 1000 mL via INTRAVENOUS

## 2016-02-06 MED ORDER — PIPERACILLIN-TAZOBACTAM 3.375 G IVPB
3.3750 g | Freq: Three times a day (TID) | INTRAVENOUS | Status: DC
  Administered 2016-02-06 – 2016-02-09 (×10): 3.375 g via INTRAVENOUS
  Filled 2016-02-06 (×10): qty 50

## 2016-02-06 MED ORDER — VANCOMYCIN HCL IN DEXTROSE 1-5 GM/200ML-% IV SOLN
1000.0000 mg | Freq: Once | INTRAVENOUS | Status: AC
  Administered 2016-02-06: 1000 mg via INTRAVENOUS
  Filled 2016-02-06: qty 200

## 2016-02-06 MED ORDER — SODIUM CHLORIDE 0.9 % IV SOLN: Freq: Once | INTRAVENOUS | Status: AC

## 2016-02-06 NOTE — Progress Notes (Signed)
*  PRELIMINARY RESULTS* Echocardiogram 2D Echocardiogram has been performed.  Leavy Cella 02/06/2016, 12:07 PM

## 2016-02-06 NOTE — Progress Notes (Signed)
Medication: Hydroxyurea  Hydroxyurea (Hydrea) . [place] < 2000 . Pltc < 80K in sickle-cell patients; < 100K in other patients . Hgb ? 6 in sickle-cell patients; < 8 in other patients . Reticulocytes < 80K when Hgb < 9  Assessment:  Hgb 5.5 this AM  Plan:  Hold Hydroxyurea today. Patient is being transfused F/U with AM labs and restart once Hgb>6  Isac Sarna, BS [place], [place] Clinical Pharmacist Pager [phone] 02/06/2016 [phone]

## 2016-02-06 NOTE — Progress Notes (Signed)
ANTIBIOTIC CONSULT NOTE-Preliminary  Pharmacy Consult for vancomycin and zosyn Indication: sepsis  Allergies  Allergen Reactions  . Desferal [Deferoxamine] Hives    Local reaction on arm only during infusion. Can take with benadryl   . Latex Other (See Comments)    REACTION: Pt experiences a burning sensation on contacted skin areas  . Lisinopril Other (See Comments) and Cough    REACTION: Sore/scratchy throat  . Tape Other (See Comments)    REACTION: Pt. Experiences a burning sensation on contacted skin areas    Patient Measurements: Height: 5\' 3"  (160 cm) Weight: 169 lb 12.8 oz (77.021 kg) IBW/kg (Calculated) : 52.4 Adjusted Body Weight:   Vital Signs: Temp: 101.4 F (38.6 C) (05/15 0500) Temp Source: Oral (05/15 0500) BP: 130/62 mmHg (05/15 0500) Pulse Rate: 135 (05/15 0500)  Labs:  Recent Labs  02/04/16 0449 02/05/16 1130  WBC 8.7 13.1*  HGB 7.6* 6.8*  PLT 189 PLATELET CLUMPS NOTED ON SMEAR, COUNT APPEARS ADEQUATE  CREATININE 0.33* <0.30*    CrCl cannot be calculated (Patient has no serum creatinine result on file.).  No results for input(s): VANCOTROUGH, VANCOPEAK, VANCORANDOM, GENTTROUGH, GENTPEAK, GENTRANDOM, TOBRATROUGH, TOBRAPEAK, TOBRARND, AMIKACINPEAK, AMIKACINTROU, AMIKACIN in the last 72 hours.   Microbiology: Recent Results (from the past 720 hour(s))  Blood culture (routine x 2)     Status: None (Preliminary result)   Collection Time: 02/05/16  4:05 PM  Result Value Ref Range Status   Specimen Description BLOOD LEFT HAND  Final   Special Requests BOTTLES DRAWN AEROBIC AND ANAEROBIC [facility] EACH  Final   Culture PENDING  Incomplete   Report Status PENDING  Incomplete  Blood culture (routine x 2)     Status: None (Preliminary result)   Collection Time: 02/05/16  4:11 PM  Result Value Ref Range Status   Specimen Description BLOOD RIGHT HAND  Final   Special Requests BOTTLES DRAWN AEROBIC ONLY 4CC  Final   Culture PENDING  Incomplete   Report Status  PENDING  Incomplete    Medical History: Past Medical History  Diagnosis Date  . Sickle cell disease ([place])   . Sickle cell disease, type S ([place])   . Blood transfusion   . 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 HCAP (healthcare-associated pneumonia) 05/19/2013  . Sickle cell anemia ([facility])   . 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 Vitamin B12 deficiency 07/07/2015    Medications:  Scheduled:  . sodium chloride   Intravenous Once  . sodium chloride   Intravenous Once  . cefTRIAXone (ROCEPHIN)  IV  1 g Intravenous Q24H  . folic acid  1 mg Oral Daily  .  HYDROmorphone (DILAUDID) injection  2 mg Intravenous Q2H  . hydroxyurea  1,000 mg Oral Daily  . loratadine  10 mg Oral Daily  . metoprolol  2.5 mg Intravenous Once  . piperacillin-tazobactam (ZOSYN)  IV  3.375 g Intravenous Q8H  . sodium chloride flush  3 mL Intravenous Q12H  . vancomycin  1,000 mg Intravenous Once  . [START ON 02/12/2016] Vitamin D (Ergocalciferol)  50,000 Units Oral Q7 days   Infusions:  . sodium chloride 125 mL/hr at 02/05/16 1636   PRN: acetaminophen **OR** acetaminophen, diphenhydrAMINE, ondansetron (ZOFRAN) IV, oxyCODONE, promethazine Anti-infectives    Start     Dose/Rate Route Frequency Ordered Stop   02/06/16 1500  cefTRIAXone (ROCEPHIN) 1 g in dextrose 5 % 50 mL IVPB     1 g 100 mL/hr over 30 Minutes Intravenous Every 24 hours 02/05/16 1646  02/06/16 0600  piperacillin-tazobactam (ZOSYN) IVPB 3.375 g     3.375 g 12.5 mL/hr over 240 Minutes Intravenous Every 8 hours 02/06/16 0532     02/06/16 0545  vancomycin (VANCOCIN) IVPB 1000 mg/200 mL premix     1,000 mg 200 mL/hr over 60 Minutes Intravenous  Once 02/06/16 0532     02/05/16 1615  cefTRIAXone (ROCEPHIN) 1 g in dextrose 5 % 50 mL IVPB  Status:  Discontinued     1 g 100 mL/hr over 30 Minutes Intravenous Every 24 hours 02/05/16 1606 02/05/16 1645   02/05/16 1515  cefTRIAXone (ROCEPHIN) 1 g in dextrose 5 % 50 mL IVPB     1 g 100 mL/hr over  30 Minutes Intravenous  Once 02/05/16 1512 02/05/16 1609      Assessment: 35 yo female admitted for fever, sickle cell crisis.   Goal of Therapy:  Vancomycin trough level 15-20 mcg/ml  Plan:  Preliminary review of pertinent patient information completed.  Protocol will be initiated with a one-time dose(s) of vancomycin 1 gram and zosyn 3.375 grams IV Q 8 hours.  Forestine Na clinical pharmacist will complete review during morning rounds to assess patient and finalize treatment regimen.  Nyra Capes, RPH 02/06/2016,5:33 AM

## 2016-02-06 NOTE — Care Management Note (Signed)
Case Management Note  Patient Details  Name: Dawn [name] MRN: [id number] Date of Birth: 12-25-1980  Subjective/Objective:                  Pt admitted with [place]. Pt is from home, ind with ADL's. Pt has PCP, transportation and can afford her medications.   Action/Plan: Plans to return home with self care at DC. No CM needs anticipated.   Expected Discharge Date:  02/07/16               Expected Discharge Plan:  Home/Self Care  In-House Referral:  NA  Discharge planning Services  CM Consult  Post Acute Care Choice:  NA Choice offered to:  NA  DME Arranged:    DME Agency:     HH Arranged:    HH Agency:     Status of Service:  Completed, signed off  Medicare Important Message Given:    Date Medicare IM Given:    Medicare IM give by:    Date Additional Medicare IM Given:    Additional Medicare Important Message give by:     If discussed at [place] of Stay Meetings, dates discussed:    Additional Comments:  Sherald Barge, RN 02/06/2016, 2:57 PM

## 2016-02-06 NOTE — Consult Note (Addendum)
CARDIOLOGY CONSULT NOTE  Patient ID: Dawn [name] MRN: [id number] DOB/AGE: November 15, 1980 35 y.o.  Admit date: 02/05/2016 Primary Physician: Angelica Chessman, MD Referring Physician: Truman Hayward MD  Reason for Consultation: troponin elevation  HPI: 35 yr old woman with sickle cell disease admitted with fever (103.3) and pain in elbows and lower back. CXR was unremarkable (no pneumonia). Being treated with IV fluid and narcotics for acute sickle cell crisis. Source of fever being investigated. Normal lactate and procalcitonin.  ESR mildly elevated (35). UA positive for nitrites. Currently on IV Zosyn and Dilaudid. Noted to be tachycardic. Troponin 1.14 Hgb 7.6-->6.8-->5.5 (today). ECG showed sinus tachycardia. On ASA 325.  Echocardiogram 14/43/60 normal LV systolic function, EF 16-58%, indeterminate diastolic function, small posterior pericardial effusion, severe left atrial enlargement, mild mitral and tricuspid regurgitation.  Denies chest pain and shortness of breath. Pt is lethargic.  Allergies  Allergen Reactions  . Desferal [Deferoxamine] Hives    Local reaction on arm only during infusion. Can take with benadryl   . Latex Other (See Comments)    REACTION: Pt experiences a burning sensation on contacted skin areas  . Lisinopril Other (See Comments) and Cough    REACTION: Sore/scratchy throat  . Tape Other (See Comments)    REACTION: Pt. Experiences a burning sensation on contacted skin areas    Current Facility-Administered Medications  Medication Dose Route Frequency Provider Last Rate Last Dose  . 0.9 %  sodium chloride infusion   Intravenous Continuous Jani Gravel, MD 125 mL/hr at 02/06/16 0600    . 0.9 %  sodium chloride infusion   Intravenous Once Theressa Millard, MD      . acetaminophen (TYLENOL) tablet 650 mg  650 mg Oral Q6H PRN Jani Gravel, MD       Or  . acetaminophen (TYLENOL) suppository 650 mg  650 mg Rectal Q6H PRN Jani Gravel, MD      . acetaminophen  (TYLENOL) tablet 650 mg  650 mg Oral Once Theressa Millard, MD      . aspirin tablet 325 mg  325 mg Oral Daily [name], MD   0 mg at 02/06/16 1000  . diphenhydrAMINE (BENADRYL) capsule 25 mg  25 mg Oral Once Theressa Millard, MD      . diphenhydrAMINE (BENADRYL) injection 25 mg  25 mg Intravenous Q6H PRN Jani Gravel, MD      . folic acid (FOLVITE) tablet 1 mg  1 mg Oral Daily Jani Gravel, MD   1 mg at 02/06/16 0800  . HYDROmorphone (DILAUDID) injection 2 mg  2 mg Intravenous Q2H Jani Gravel, MD   2 mg at 02/06/16 0063  . hydroxyurea (HYDREA) capsule 1,000 mg  1,000 mg Oral Daily Jani Gravel, MD   Stopped at 02/06/16 1000  . loratadine (CLARITIN) tablet 10 mg  10 mg Oral Daily Jani Gravel, MD   10 mg at 02/06/16 0800  . ondansetron (ZOFRAN) injection 4 mg  4 mg Intravenous Q6H PRN Jani Gravel, MD      . oxyCODONE (Oxy IR/ROXICODONE) immediate release tablet 10 mg  10 mg Oral Q4H PRN Jani Gravel, MD   10 mg at 02/06/16 0800  . piperacillin-tazobactam (ZOSYN) IVPB 3.375 g  3.375 g Intravenous Q8H [name], MD   3.375 g at 02/06/16 0600  . promethazine (PHENERGAN) tablet 25 mg  25 mg Oral Q6H PRN Jani Gravel, MD   25 mg at 02/06/16 0800  . sodium chloride flush (NS) 0.9 %  injection 3 mL  3 mL Intravenous Q12H Pearson Grippe, MD   3 mL at 02/05/16 2200  . [START ON 02/12/2016] Vitamin D (Ergocalciferol) (DRISDOL) capsule 50,000 Units  50,000 Units Oral Q7 days Pearson Grippe, MD        Past Medical History  Diagnosis Date  . Sickle cell disease (HCC)   . Sickle cell disease, type S (HCC)   . Blood transfusion   . 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 HCAP (healthcare-associated pneumonia) 05/19/2013  . Sickle cell anemia (HCC)   . 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 Vitamin B12 deficiency 07/07/2015    Past Surgical History  Procedure Laterality Date  . Cholecystectomy    .  left knee acl reconstruction    . Cesarean section      x 2  . Portacath placement    . Portacath placement Left 02/23/2013     Procedure: INSERTION PORT-A-CATH;  Surgeon: Fabio Bering, MD;  Location: [facility];  Service: General;  Laterality: Left;  . Port-a-cath removal Left 07/29/2013    Procedure: REMOVAL PORT-A-CATH;  Surgeon: Marlane Hatcher, MD;  Location: [facility];  Service: General;  Laterality: Left;  . Tee without cardioversion N/A 08/07/2013    Procedure: TRANSESOPHAGEAL ECHOCARDIOGRAM (TEE);  Surgeon: Antoine Poche, MD;  Location: [facility];  Service: Cardiology;  Laterality: N/A;    Social History   Social History  . Marital Status: Single    Spouse Name: N/A  . Number of Children: N/A  . Years of Education: N/A   Occupational History  . Not on file.   Social History Main Topics  . Smoking status: Never Smoker   . Smokeless tobacco: Never Used  . Alcohol Use: No  . Drug Use: No  . Sexual Activity: Yes    Birth Control/ Protection: IUD   Other Topics Concern  . Not on file   Social History Narrative     No family history of premature CAD in 1st degree relatives.  Prior to Admission medications   Medication Sig Start Date End Date Taking? Authorizing Provider  folic acid (FOLVITE) 1 MG tablet Take 1 tablet (1 mg total) by mouth daily. 08/16/15  Yes Massie Maroon, FNP  hydroxyurea (HYDREA) 500 MG capsule Take 2 capsules (1,000 mg total) by mouth daily. May take with food to minimize GI side effects. 09/05/15  Yes Massie Maroon, FNP  loratadine (CLARITIN) 10 MG tablet Take 10 mg by mouth daily.   Yes Historical Provider, MD  morphine (MS CONTIN) 15 MG 12 hr tablet Take 1 tablet (15 mg total) by mouth every 12 (twelve) hours. 10/06/15  Yes Massie Maroon, FNP  Multiple Vitamin (MULTIVITAMIN WITH MINERALS) TABS tablet Take 1 tablet by mouth daily.   Yes Historical Provider, MD  Vitamin D, Ergocalciferol, (DRISDOL) 50000 UNITS CAPS capsule Take 1 capsule (50,000 Units total) by mouth every 7 (seven) days. Takes on Sundays. 08/16/15  Yes Massie Maroon, FNP  acetaminophen  (TYLENOL) 500 MG tablet Take 500 mg by mouth every 6 (six) hours as needed for mild pain.     Historical Provider, MD  cyclobenzaprine (FLEXERIL) 10 MG tablet Take 1 tablet (10 mg total) by mouth 3 (three) times daily as needed for muscle spasms. Patient not taking: Reported on 12/31/2015 12/14/15   Houston Siren, MD  hydrOXYzine (ATARAX/VISTARIL) 10 MG tablet Take 1 tablet (10 mg total) by mouth 3 (three) times daily as needed for itching. Patient not taking: Reported  on 01/30/2016 12/14/15   [name], MD  ibuprofen (ADVIL,MOTRIN) 200 MG tablet Take 200 mg by mouth every 6 (six) hours as needed for mild pain or moderate pain.    Historical Provider, MD  levonorgestrel (MIRENA) 20 MCG/24HR IUD 1 each by Intrauterine route once.    Historical Provider, MD  Oxycodone HCl 10 MG TABS Take 1 tablet (10 mg total) by mouth every 6 (six) hours as needed. Patient taking differently: Take 10 mg by mouth every 4 (four) hours as needed (breakthrough pain).  10/06/15   [name], FNP  polyethylene glycol (MIRALAX / GLYCOLAX) packet Take 17 g by mouth daily as needed for mild constipation. Patient not taking: Reported on 01/30/2016 01/15/16   Erline Hau, MD  promethazine (PHENERGAN) 25 MG tablet Take 1 tablet (25 mg total) by mouth every 6 (six) hours as needed for nausea or vomiting. 12/07/15   [name], MD     Review of systems complete and found to be negative unless listed above in HPI     Physical exam Blood pressure 130/62, pulse 135, temperature 99.1 F (37.3 C), temperature source Oral, resp. rate 20, height '5\' 3"'$  (1.6 m), weight 169 lb 12.8 oz (77.021 kg), SpO2 100 %. General: Lethargic. Neck: No JVD, no thyromegaly or thyroid nodule.  Lungs: Clear to auscultation bilaterally with normal respiratory effort. CV: Nondisplaced PMI. Tachycardic, regular rhythm, normal S1/S2, no S3/S4, no murmur.  No peripheral edema.    Abdomen: Soft, nontender, no distention.  Skin: Intact without  lesions or rashes.  Neurologic: Lethargic.  HEENT: Jaundiced sclera.   ECG: Most recent ECG reviewed.  Labs:   Lab Results  Component Value Date   WBC 13.9* 02/06/2016   HGB 5.5* 02/06/2016   HCT 14.3* 02/06/2016   MCV 92.3 02/06/2016   PLT SPECIMEN CHECKED FOR CLOTS 02/06/2016    Recent Labs Lab 02/06/16 0529  NA 137  K 3.4*  CL 111  CO2 20*  BUN 11  CREATININE 0.32*  CALCIUM 7.9*  PROT 6.9  BILITOT 17.1*  ALKPHOS 114  ALT 34  AST 145*  GLUCOSE 96   Lab Results  Component Value Date   CKTOTAL 21 08/20/2010   CKMB 0.4 08/20/2010   TROPONINI 1.14* 02/06/2016   No results found for: CHOL No results found for: HDL No results found for: LDLCALC No results found for: TRIG No results found for: [facility] Lab Results  Component Value Date   LDLDIRECT 55 07/08/2012         Studies: Dg Chest 2 View  02/05/2016  CLINICAL DATA:  Fever, sickle cell disease EXAM: CHEST  2 VIEW COMPARISON:  01/12/2016 FINDINGS: Cardiomediastinal silhouette is stable. No acute infiltrate or pleural effusion. No pulmonary edema. Bony thorax is unremarkable. IMPRESSION: No active cardiopulmonary disease. Electronically Signed   By: Lahoma Crocker M.D.   On: 02/05/2016 14:51   Dg Chest Port 1 View  02/06/2016  CLINICAL DATA:  Sickle cell patient. Shortness of breath, chest pain, and fever. EXAM: PORTABLE CHEST 1 VIEW COMPARISON:  02/05/2016 FINDINGS: Mild cardiac enlargement. No vascular congestion or edema. No focal airspace disease or consolidation. Soft tissue attenuation over the left lung base. No blunting of costophrenic angles. No pneumothorax. Mediastinal contours appear intact. 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 IMPRESSION: Mild cardiac enlargement.  No evidence of active pulmonary disease. Electronically Signed   By: Lucienne Capers M.D.   On: 02/06/2016 06:33    ASSESSMENT AND  PLAN:  1. Troponin elevation: While patients with sickle cell disease can develop  MI's and cardiomyopathies, I feel her present elevation is consistent with demand ischemia in the context of marked anemia (Hgb 5.5)/acute sickle cell crisis. Agree with ASA. Would not add IV heparin at this time. Await results of echocardiogram.  2. Fever/UTI: Being treated with IV fluids and IV Zosyn.  3. Acute sickle cell crisis: On IV fluids and IV Dilaudid.  4. Tachycardia: Physiologic in response to marked anemia, acute sickle cell crisis, and UTI.   Signed: Kate Sable, M.D., F.A.C.C.  02/06/2016, 9:55 AM

## 2016-02-06 NOTE — Progress Notes (Signed)
[facility] PROGRESS NOTE  Dawn [name] [id number] DOB: [birth year]    PCP:   Angelica Chessman, MD   HPI: Dawn [name] is an 35 y.o. female with hx of sickle cell disease, admitted with crisis, sepsis, UTI, and troponin was drawn which came back 1.14.  Her BP is OK, but she has been tachycardic.  Her Hb was 5.5 g per dL.  BC were negative so far.  So was given IV Lucianne Lei and Zosyn in the ER.    Rewiew of Systems:  Constitutional: Negative for malaise, fever and chills. No significant weight loss or weight gain Eyes: Negative for eye pain, redness and discharge, diplopia, visual changes, or flashes of light. ENMT: Negative for ear pain, hoarseness, nasal congestion, sinus pressure and sore throat. No headaches; tinnitus, drooling, or problem swallowing. Cardiovascular: Negative for chest pain, palpitations, diaphoresis, dyspnea and peripheral edema. ; No orthopnea, PND Respiratory: Negative for cough, hemoptysis, wheezing and stridor. No pleuritic chestpain. Gastrointestinal: Negative for nausea, vomiting, diarrhea, constipation, abdominal pain, melena, blood in stool, hematemesis, jaundice and rectal bleeding.    Genitourinary: Negative for frequency, dysuria, incontinence,flank pain and hematuria; Musculoskeletal: Negative for back pain and neck pain. Negative for swelling and trauma.;  Skin: . Negative for pruritus, rash, abrasions, bruising and skin lesion.; ulcerations Neuro: Negative for headache, lightheadedness and neck stiffness. Negative for weakness, altered level of consciousness , altered mental status, extremity weakness, burning feet, involuntary movement, seizure and syncope.  Psych: negative for anxiety, depression, insomnia, tearfulness, panic attacks, hallucinations, paranoia, suicidal or homicidal ideation    Past Medical History  Diagnosis Date  . Sickle cell disease ([place])   . Sickle cell disease, type S ([place])   . Blood transfusion   . Reactive depression  (situational) 03/28/2012  . HCAP (healthcare-associated pneumonia) 05/19/2013  . Sickle cell anemia (HCC)   . 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 Vitamin B12 deficiency 07/07/2015    Past Surgical History  Procedure Laterality Date  . Cholecystectomy    .  left knee acl reconstruction    . Cesarean section      x 2  . Portacath placement    . Portacath placement Left 02/23/2013    Procedure: INSERTION PORT-A-CATH;  Surgeon: Donato Heinz, MD;  Location: [facility];  Service: General;  Laterality: Left;  . Port-a-cath removal Left 07/29/2013    Procedure: REMOVAL PORT-A-CATH;  Surgeon: Scherry Ran, MD;  Location: [facility];  Service: General;  Laterality: Left;  . Tee without cardioversion N/A 08/07/2013    Procedure: TRANSESOPHAGEAL ECHOCARDIOGRAM (TEE);  Surgeon: Arnoldo Lenis, MD;  Location: [facility];  Service: Cardiology;  Laterality: N/A;    Medications:  HOME MEDS: Prior to Admission medications   Medication Sig Start Date End Date Taking? Authorizing Provider  folic acid (FOLVITE) 1 MG tablet Take 1 tablet (1 mg total) by mouth daily. 08/16/15  Yes [name], FNP  hydroxyurea (HYDREA) 500 MG capsule Take 2 capsules (1,000 mg total) by mouth daily. May take with food to minimize GI side effects. 09/05/15  Yes [name], FNP  loratadine (CLARITIN) 10 MG tablet Take 10 mg by mouth daily.   Yes Historical Provider, MD  morphine (MS CONTIN) 15 MG 12 hr tablet Take 1 tablet (15 mg total) by mouth every 12 (twelve) hours. 10/06/15  Yes [name], FNP  Multiple Vitamin (MULTIVITAMIN WITH MINERALS) TABS tablet Take 1 tablet by mouth daily.   Yes Historical Provider, MD  Vitamin D, Ergocalciferol, (DRISDOL) 50000 UNITS CAPS capsule Take 1 capsule (50,000 Units total) by mouth every 7 (seven) days. Takes on Sundays. 08/16/15  Yes [name], FNP  acetaminophen (TYLENOL) 500 MG tablet Take 500 mg by mouth every 6 (six) hours as needed for mild pain.      Historical Provider, MD  cyclobenzaprine (FLEXERIL) 10 MG tablet Take 1 tablet (10 mg total) by mouth 3 (three) times daily as needed for muscle spasms. Patient not taking: Reported on 12/31/2015 12/14/15   [name], MD  hydrOXYzine (ATARAX/VISTARIL) 10 MG tablet Take 1 tablet (10 mg total) by mouth 3 (three) times daily as needed for itching. Patient not taking: Reported on 01/30/2016 12/14/15   [name], MD  ibuprofen (ADVIL,MOTRIN) 200 MG tablet Take 200 mg by mouth every 6 (six) hours as needed for mild pain or moderate pain.    Historical Provider, MD  levonorgestrel (MIRENA) 20 MCG/24HR IUD 1 each by Intrauterine route once.    Historical Provider, MD  Oxycodone HCl 10 MG TABS Take 1 tablet (10 mg total) by mouth every 6 (six) hours as needed. Patient taking differently: Take 10 mg by mouth every 4 (four) hours as needed (breakthrough pain).  10/06/15   [name], FNP  polyethylene glycol (MIRALAX / GLYCOLAX) packet Take 17 g by mouth daily as needed for mild constipation. Patient not taking: Reported on 01/30/2016 01/15/16   Erline Hau, MD  promethazine (PHENERGAN) 25 MG tablet Take 1 tablet (25 mg total) by mouth every 6 (six) hours as needed for nausea or vomiting. 12/07/15   [name], MD     Allergies:  Allergies  Allergen Reactions  . Desferal [Deferoxamine] Hives    Local reaction on arm only during infusion. Can take with benadryl   . Latex Other (See Comments)    REACTION: Pt experiences a burning sensation on contacted skin areas  . Lisinopril Other (See Comments) and Cough    REACTION: Sore/scratchy throat  . Tape Other (See Comments)    REACTION: Pt. Experiences a burning sensation on contacted skin areas    Social History:   reports that she has never smoked. She has never used smokeless tobacco. She reports that she does not drink alcohol or use illicit drugs.  Family History: Family History  Problem Relation Age of Onset  . Sickle cell trait  Mother   . Sickle cell trait Father   . Hypertension Father   . Diabetes Father   . Sickle cell trait Sister   . Cancer Paternal Aunt     Breast     Physical Exam: Filed Vitals:   02/06/16 0240 02/06/16 0457 02/06/16 0500 02/06/16 0837  BP: 126/71  130/62   Pulse: 140  135   Temp: 103.2 F (39.6 C)  101.4 F (38.6 C) 99.1 F (37.3 C)  TempSrc:   Oral   Resp: 22  20   Height:      Weight:  77.021 kg (169 lb 12.8 oz) 77.021 kg (169 lb 12.8 oz)   SpO2: 100%  100%    Blood pressure 130/62, pulse 135, temperature 99.1 F (37.3 C), temperature source Oral, resp. rate 20, height 5\' 3"  (1.6 m), weight 77.021 kg (169 lb 12.8 oz), SpO2 100 %.  GEN:  Pleasant  patient lying in the stretcher in no acute distress; cooperative with exam. PSYCH:  alert and oriented x4; does not appear anxious or depressed; affect is appropriate. HEENT: Mucous membranes pink  and anicteric; PERRLA; EOM intact; no cervical lymphadenopathy nor thyromegaly or carotid bruit; no JVD; There were no stridor. Neck is very supple. Breasts:: Not examined CHEST WALL: No tenderness CHEST: Normal respiration, clear to auscultation bilaterally.  HEART: Regular rate and rhythm.  There are no murmur, rub, or gallops.   BACK: No kyphosis or scoliosis; no CVA tenderness ABDOMEN: soft and non-tender; no masses, no organomegaly, normal abdominal bowel sounds; no pannus; no intertriginous candida. There is no rebound and no distention. Rectal Exam: Not done EXTREMITIES: No bone or joint deformity; age-appropriate arthropathy of the hands and knees; no edema; no ulcerations.  There is no calf tenderness. Genitalia: not examined PULSES: 2+ and symmetric SKIN: Normal hydration no rash or ulceration CNS: Cranial nerves 2-12 grossly intact no focal lateralizing neurologic deficit.  Speech is fluent; uvula elevated with phonation, facial symmetry and tongue midline. DTR are normal bilaterally, cerebella exam is intact, barbinski is  negative and strengths are equaled bilaterally.  No sensory loss.   Labs on Admission:  Basic Metabolic Panel:  Recent Labs Lab 02/04/16 0449 02/05/16 1130 02/06/16 0529  NA 134* 133* 137  K 3.4* 4.5 3.4*  CL 107 106 111  CO2 24 22 20*  GLUCOSE 111* 100* 96  BUN 8 9 11   CREATININE 0.33* <0.30* 0.32*  CALCIUM 8.3* 8.2* 7.9*   Liver Function Tests:  Recent Labs Lab 02/04/16 0449 02/05/16 1130 02/06/16 0529  AST 87* 180* 145*  ALT 30 39 34  ALKPHOS 138* 131* 114  BILITOT 11.5* 15.5* 17.1*  PROT 7.9 7.5 6.9  ALBUMIN 2.9* 2.9* 2.6*   CBC:  Recent Labs Lab 02/04/16 0449 02/05/16 1130 02/06/16 0529  WBC 8.7 13.1* 13.9*  NEUTROABS 4.5 7.6 8.8*  HGB 7.6* 6.8* 5.5*  HCT 21.4* 17.8* 14.3*  MCV 99.1 93.2 92.3  PLT 189 PLATELET CLUMPS NOTED ON SMEAR, COUNT APPEARS ADEQUATE SPECIMEN CHECKED FOR CLOTS   Cardiac Enzymes:  Recent Labs Lab 02/06/16 0529  TROPONINI 1.14*     Radiological Exams on Admission: Dg Chest 2 View  02/05/2016  CLINICAL DATA:  Fever, sickle cell disease EXAM: CHEST  2 VIEW COMPARISON:  01/12/2016 FINDINGS: Cardiomediastinal silhouette is stable. No acute infiltrate or pleural effusion. No pulmonary edema. Bony thorax is unremarkable. IMPRESSION: No active cardiopulmonary disease. Electronically Signed   By: Lahoma Crocker M.D.   On: 02/05/2016 14:51   Dg Chest Port 1 View  02/06/2016  CLINICAL DATA:  Sickle cell patient. Shortness of breath, chest pain, and fever. EXAM: PORTABLE CHEST 1 VIEW COMPARISON:  02/05/2016 FINDINGS: Mild cardiac enlargement. No vascular congestion or edema. No focal airspace disease or consolidation. Soft tissue attenuation over the left lung base. No blunting of costophrenic angles. No pneumothorax. Mediastinal contours appear intact. 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 IMPRESSION: Mild cardiac enlargement.  No evidence of active pulmonary disease. Electronically Signed   By: Lucienne Capers M.D.    On: 02/06/2016 06:33    EKG: Independently reviewed.   PLAN:  Sepsis:  Will continue with IVF.  She was given IV Vanc/Rocephin, now on IV Zosyn alone.  BC has been negative.  UA was positive for UTI.  She will receive IVF, and monitor BP closely.    Elevated Troponin:  Likely demand ischemia.  Continue to cycle troponins.  ASA given, and EKG did not show any acute ST T changes.  She needs blood transfusion.  Sickle cell crisis:  Half normal saline given.  IV pain meds.  Oxygen and IVF.    Other plans as per orders. Code Status: FULL Haskel Khan, MD.  FACP [facility] Pager [phone] 7pm to 7am.  02/06/2016, 12:59 PM

## 2016-02-06 NOTE — Progress Notes (Signed)
Pharmacy Antibiotic Note  Dawn [name] is a 35 y.o. female admitted on 02/05/2016 with sepsis.  Pharmacy has been consulted for Vancomycin and Zosyn dosing.  Plan: Vancomycin 1500mg  loading dose given in ED then 750mg  IV every 8 hours.  Goal trough 10-15 mcg/mL. Zosyn 3.375g IV q8h (4 hour infusion).  F/U cultures and LOT. Transition to po as indicated  Height: 5\' 3"  (160 cm) Weight: 169 lb 12.8 oz (77.021 kg) IBW/kg (Calculated) : 52.4  Temp (24hrs), Avg:100.2 F (37.9 C), Min:98.6 F (37 C), Max:103.2 F (39.6 C)   Recent Labs Lab 02/04/16 0449 02/05/16 1130 02/06/16 0529  WBC 8.7 13.1* 13.9*  CREATININE 0.33* <0.30* 0.32*  LATICACIDVEN  --   --  1.2    Estimated Creatinine Clearance: 96.4 mL/min (by C-G formula based on Cr of 0.32).    Allergies  Allergen Reactions  . Desferal [Deferoxamine] Hives    Local reaction on arm only during infusion. Can take with benadryl   . Latex Other (See Comments)    REACTION: Pt experiences a burning sensation on contacted skin areas  . Lisinopril Other (See Comments) and Cough    REACTION: Sore/scratchy throat  . Tape Other (See Comments)    REACTION: Pt. Experiences a burning sensation on contacted skin areas    Antimicrobials this admission: Vancomycin  5/15>>  Zosyn 5/15 >>  Ceftriaxone 5/14>>5/15  Dose adjustments this admission:  Microbiology results: 5/14 and  5/15 BCx: pending 5/14 UCx: pending  Thank you for allowing pharmacy to be a part of this patient's care.  Isac Sarna, BS Pharm D, [place] Clinical Pharmacist Pager [phone] 02/06/2016 10:53 AM

## 2016-02-06 NOTE — Progress Notes (Signed)
CRITICAL VALUE ALERT  Critical value received:  HGB=5.5  Date of notification:  02/06/16  Time of notification:    Critical value read back:Yes.    Nurse who received alert:  Manuela Schwartz, RN  MD notified (1st page):  Gasper Lloyd  Time of first page:  [phone]  MD notified (2nd page):  Time of second page:  Responding MD:   Time MD responded: Text paged.  Current orders to transfuse when blood is available.

## 2016-02-07 DIAGNOSIS — D57219 Sickle-cell/Hb-C disease with crisis, unspecified: Secondary | ICD-10-CM

## 2016-02-07 LAB — URINE CULTURE

## 2016-02-07 LAB — CBC
HEMATOCRIT: 22.7 % — AB (ref 36.0–46.0)
HEMOGLOBIN: 8.5 g/dL — AB (ref 12.0–15.0)
MCH: 32.3 pg (ref 26.0–34.0)
MCHC: 37.4 g/dL — AB (ref 30.0–36.0)
MCV: 86.3 fL (ref 78.0–100.0)
Platelets: 120 10*3/uL — ABNORMAL LOW (ref 150–400)
RBC: 2.63 MIL/uL — ABNORMAL LOW (ref 3.87–5.11)
RDW: 26.5 % — AB (ref 11.5–15.5)
WBC: 14 10*3/uL — ABNORMAL HIGH (ref 4.0–10.5)

## 2016-02-07 LAB — TYPE AND SCREEN
ABO/RH(D): A POS
Antibody Screen: POSITIVE
DAT, IGG: NEGATIVE
Unit division: 0
Unit division: 0

## 2016-02-07 LAB — TROPONIN I: Troponin I: 0.38 ng/mL — ABNORMAL HIGH (ref <0.031)

## 2016-02-07 MED ORDER — HYDROMORPHONE HCL 1 MG/ML IJ SOLN
2.0000 mg | INTRAMUSCULAR | Status: DC | PRN
  Administered 2016-02-07 – 2016-02-09 (×17): 2 mg via INTRAVENOUS
  Filled 2016-02-07 (×16): qty 2

## 2016-02-07 MED ORDER — SODIUM CHLORIDE 0.9 % IV SOLN
INTRAVENOUS | Status: AC
  Administered 2016-02-07 – 2016-02-08 (×3): via INTRAVENOUS

## 2016-02-07 NOTE — Progress Notes (Signed)
Patient c/o chest pain, done stat EKG, made on call MD aware, will follow any new orders received and continue to monitor the patient.

## 2016-02-07 NOTE — Progress Notes (Signed)
Medication: Hydroxyurea  Hydroxyurea (Hydrea) . [place] < 2000 . Pltc < 80K in sickle-cell patients; < 100K in other patients . Hgb ? 6 in sickle-cell patients; < 8 in other patients . Reticulocytes < 80K when Hgb < 9  Assessment:  Hgb 8.5 this AM  Plan:  Hydroxyurea resumed today.  Pricilla Larsson, [facility] 02/07/2016 9:14 AM

## 2016-02-07 NOTE — Progress Notes (Signed)
[facility] PROGRESS NOTE  Dawn [name] [id number] DOB: 06/05/1981    PCP:   Angelica Chessman, MD   HPI:  Dawn [name] is an 35 y.o. female with hx of sickle cell disease, admitted with crisis, sepsis, UTI, and troponin was drawn which came back 1.14. Her BP is OK, but she has been tachycardic. Her Hb was 5.5 g per dL. BC were negative so far. So was given IV Lucianne Lei and Zosyn in the ER, and currently on IV Zosyn alone.  She is much better today.  Hb is now 8.5 g per dL after 2 units of PRBC.  Her troponins trended down, peaked at 1.14.   She didn't remember much of yesterday.    Rewiew of Systems:  Constitutional: Negative for malaise, fever and chills. No significant weight loss or weight gain Eyes: Negative for eye pain, redness and discharge, diplopia, visual changes, or flashes of light. ENMT: Negative for ear pain, hoarseness, nasal congestion, sinus pressure and sore throat. No headaches; tinnitus, drooling, or problem swallowing. Cardiovascular: Negative for chest pain, palpitations, diaphoresis, dyspnea and peripheral edema. ; No orthopnea, PND Respiratory: Negative for cough, hemoptysis, wheezing and stridor. No pleuritic chestpain. Gastrointestinal: Negative for nausea, vomiting, diarrhea, constipation, abdominal pain, melena, blood in stool, hematemesis, jaundice and rectal bleeding.    Genitourinary: Negative for frequency, dysuria, incontinence,flank pain and hematuria; Musculoskeletal: Negative for back pain and neck pain. Negative for swelling and trauma.;  Skin: . Negative for pruritus, rash, abrasions, bruising and skin lesion.; ulcerations Neuro: Negative for headache, lightheadedness and neck stiffness. Negative for weakness, altered level of consciousness , altered mental status, extremity weakness, burning feet, involuntary movement, seizure and syncope.  Psych: negative for anxiety, depression, insomnia, tearfulness, panic attacks, hallucinations, paranoia,  suicidal or homicidal ideation    Past Medical History  Diagnosis Date  . Sickle cell disease ([place])   . 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 Blood transfusion   . 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 HCAP (healthcare-associated pneumonia) 05/19/2013  . Sickle cell anemia ([facility])   . 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 Vitamin B12 deficiency 07/07/2015    Past Surgical History  Procedure Laterality Date  . Cholecystectomy    .  left knee acl reconstruction    . Cesarean section      x 2  . Portacath placement    . Portacath placement Left 02/23/2013    Procedure: INSERTION PORT-A-CATH;  Surgeon: Donato Heinz, MD;  Location: [facility];  Service: General;  Laterality: Left;  . Port-a-cath removal Left 07/29/2013    Procedure: REMOVAL PORT-A-CATH;  Surgeon: Scherry Ran, MD;  Location: [facility];  Service: General;  Laterality: Left;  . Tee without cardioversion N/A 08/07/2013    Procedure: TRANSESOPHAGEAL ECHOCARDIOGRAM (TEE);  Surgeon: Arnoldo Lenis, MD;  Location: [facility];  Service: Cardiology;  Laterality: N/A;    Medications:  HOME MEDS: Prior to Admission medications   Medication Sig Start Date End Date Taking? Authorizing Provider  folic acid (FOLVITE) 1 MG tablet Take 1 tablet (1 mg total) by mouth daily. 08/16/15  Yes [name], FNP  hydroxyurea (HYDREA) 500 MG capsule Take 2 capsules (1,000 mg total) by mouth daily. May take with food to minimize GI side effects. 09/05/15  Yes [name], FNP  loratadine (CLARITIN) 10 MG tablet Take 10 mg by mouth daily.   Yes Historical Provider, MD  morphine (MS CONTIN) 15 MG 12 hr tablet Take 1  tablet (15 mg total) by mouth every 12 (twelve) hours. 10/06/15  Yes [name], FNP  Multiple Vitamin (MULTIVITAMIN WITH MINERALS) TABS tablet Take 1 tablet by mouth daily.   Yes Historical Provider, MD  Vitamin D, Ergocalciferol, (DRISDOL) 50000 UNITS CAPS capsule Take 1 capsule (50,000 Units total) by  mouth every 7 (seven) days. Takes on Sundays. 08/16/15  Yes [name], FNP  acetaminophen (TYLENOL) 500 MG tablet Take 500 mg by mouth every 6 (six) hours as needed for mild pain.     Historical Provider, MD  cyclobenzaprine (FLEXERIL) 10 MG tablet Take 1 tablet (10 mg total) by mouth 3 (three) times daily as needed for muscle spasms. Patient not taking: Reported on 12/31/2015 12/14/15   [name], MD  hydrOXYzine (ATARAX/VISTARIL) 10 MG tablet Take 1 tablet (10 mg total) by mouth 3 (three) times daily as needed for itching. Patient not taking: Reported on 01/30/2016 12/14/15   [name], MD  ibuprofen (ADVIL,MOTRIN) 200 MG tablet Take 200 mg by mouth every 6 (six) hours as needed for mild pain or moderate pain.    Historical Provider, MD  levonorgestrel (MIRENA) 20 MCG/24HR IUD 1 each by Intrauterine route once.    Historical Provider, MD  Oxycodone HCl 10 MG TABS Take 1 tablet (10 mg total) by mouth every 6 (six) hours as needed. Patient taking differently: Take 10 mg by mouth every 4 (four) hours as needed (breakthrough pain).  10/06/15   [name], FNP  polyethylene glycol (MIRALAX / GLYCOLAX) packet Take 17 g by mouth daily as needed for mild constipation. Patient not taking: Reported on 01/30/2016 01/15/16   Erline Hau, MD  promethazine (PHENERGAN) 25 MG tablet Take 1 tablet (25 mg total) by mouth every 6 (six) hours as needed for nausea or vomiting. 12/07/15   [name], MD     Allergies:  Allergies  Allergen Reactions  . Desferal [Deferoxamine] Hives    Local reaction on arm only during infusion. Can take with benadryl   . Latex Other (See Comments)    REACTION: Pt experiences a burning sensation on contacted skin areas  . Lisinopril Other (See Comments) and Cough    REACTION: Sore/scratchy throat  . Tape Other (See Comments)    REACTION: Pt. Experiences a burning sensation on contacted skin areas    Social History:   reports that she has never smoked. She  has never used smokeless tobacco. She reports that she does not drink alcohol or use illicit drugs.  Family History: Family History  Problem Relation Age of Onset  . Sickle cell trait Mother   . Sickle cell trait Father   . Hypertension Father   . Diabetes Father   . Sickle cell trait Sister   . Cancer Paternal Aunt     Breast     Physical Exam: Filed Vitals:   02/06/16 2015 02/06/16 2321 02/07/16 0500 02/07/16 0749  BP: 124/68 115/56 123/75   Pulse: 97 90 88   Temp: 98.5 F (36.9 C) 98.6 F (37 C) 97.5 F (36.4 C) 97.5 F (36.4 C)  TempSrc: Oral Oral Oral Oral  Resp: 20 20 20    Height:      Weight:   79.062 kg (174 lb 4.8 oz)   SpO2: 93% 93% 94%    Blood pressure 123/75, pulse 88, temperature 97.5 F (36.4 C), temperature source Oral, resp. rate 20, height 5\' 3"  (1.6 m), weight 79.062 kg (174 lb 4.8 oz), SpO2 94 %.  GEN:  Pleasant  patient lying in the stretcher in no acute distress; cooperative with exam. PSYCH:  alert and oriented x4; does not appear anxious or depressed; affect is appropriate. HEENT: Mucous membranes pink and anicteric; PERRLA; EOM intact; no cervical lymphadenopathy nor thyromegaly or carotid bruit; no JVD; There were no stridor. Neck is very supple. Breasts:: Not examined CHEST WALL: No tenderness CHEST: Normal respiration, clear to auscultation bilaterally.  HEART: Regular rate and rhythm.  There are no murmur, rub, or gallops.   BACK: No kyphosis or scoliosis; no CVA tenderness ABDOMEN: soft and non-tender; no masses, no organomegaly, normal abdominal bowel sounds; no pannus; no intertriginous candida. There is no rebound and no distention. Rectal Exam: Not done EXTREMITIES: No bone or joint deformity; age-appropriate arthropathy of the hands and knees; no edema; no ulcerations.  There is no calf tenderness. Genitalia: not examined PULSES: 2+ and symmetric SKIN: Normal hydration no rash or ulceration CNS: Cranial nerves 2-12 grossly intact no  focal lateralizing neurologic deficit.  Speech is fluent; uvula elevated with phonation, facial symmetry and tongue midline. DTR are normal bilaterally, cerebella exam is intact, barbinski is negative and strengths are equaled bilaterally.  No sensory loss.   Labs on Admission:  Basic Metabolic Panel:  Recent Labs Lab 02/04/16 0449 02/05/16 1130 02/06/16 0529  NA 134* 133* 137  K 3.4* 4.5 3.4*  CL 107 106 111  CO2 24 22 20*  GLUCOSE 111* 100* 96  BUN 8 9 11   CREATININE 0.33* <0.30* 0.32*  CALCIUM 8.3* 8.2* 7.9*   Liver Function Tests:  Recent Labs Lab 02/04/16 0449 02/05/16 1130 02/06/16 0529  AST 87* 180* 145*  ALT 30 39 34  ALKPHOS 138* 131* 114  BILITOT 11.5* 15.5* 17.1*  PROT 7.9 7.5 6.9  ALBUMIN 2.9* 2.9* 2.6*   CBC:  Recent Labs Lab 02/04/16 0449 02/05/16 1130 02/06/16 0529 02/07/16 0813  WBC 8.7 13.1* 13.9* 14.0*  NEUTROABS 4.5 7.6 8.8*  --   HGB 7.6* 6.8* 5.5* 8.5*  HCT 21.4* 17.8* 14.3* 22.7*  MCV 99.1 93.2 92.3 86.3  PLT 189 PLATELET CLUMPS NOTED ON SMEAR, COUNT APPEARS ADEQUATE SPECIMEN CHECKED FOR CLOTS 120*   Cardiac Enzymes:  Recent Labs Lab 02/06/16 0529 02/06/16 1136 02/07/16 0047  TROPONINI 1.14* 0.96* 0.38*    Radiological Exams on Admission: Dg Chest 2 View  02/05/2016  CLINICAL DATA:  Fever, sickle cell disease EXAM: CHEST  2 VIEW COMPARISON:  01/12/2016 FINDINGS: Cardiomediastinal silhouette is stable. No acute infiltrate or pleural effusion. No pulmonary edema. Bony thorax is unremarkable. IMPRESSION: No active cardiopulmonary disease. Electronically Signed   By: Lahoma Crocker M.D.   On: 02/05/2016 14:51   Dg Chest Port 1 View  02/06/2016  CLINICAL DATA:  Sickle cell patient. Shortness of breath, chest pain, and fever. EXAM: PORTABLE CHEST 1 VIEW COMPARISON:  02/05/2016 FINDINGS: Mild cardiac enlargement. No vascular congestion or edema. No focal airspace disease or consolidation. Soft tissue attenuation over the left lung base. No  blunting of costophrenic angles. No pneumothorax. Mediastinal contours appear intact. 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 IMPRESSION: Mild cardiac enlargement.  No evidence of active pulmonary disease. Electronically Signed   By: Lucienne Capers M.D.   On: 02/06/2016 06:33    Assessment/Plan  PLAN:  Sepsis: Will continue with IVF. She was given IV Vanc/Rocephin, now on IV Zosyn alone. BC has been negative. UA was positive for UTI.Urine culture is pending.  She is getting better.  Continue with  IVF, and monitor BP closely.   Elevated Troponin: Likely demand ischemia. Trended down, peaked at 1.14.  Cardiology saw her.  ASA given, and EKG did not show any acute ST T changes. She is better after PRBC.  Sickle cell crisis: Half normal saline given. IV pain meds to be given PRN.  Oxygen and IVF.    Other plans as per orders. Code Status: FULL Haskel Khan, MD.  FACP [facility] Pager [phone] 7pm to 7am.  02/07/2016, 10:35 AM

## 2016-02-08 LAB — TROPONIN I: Troponin I: 0.23 ng/mL — ABNORMAL HIGH (ref <0.031)

## 2016-02-08 LAB — BASIC METABOLIC PANEL
ANION GAP: 4 — AB (ref 5–15)
BUN: 18 mg/dL (ref 6–20)
CALCIUM: 8.1 mg/dL — AB (ref 8.9–10.3)
CO2: 22 mmol/L (ref 22–32)
CREATININE: 0.82 mg/dL (ref 0.44–1.00)
Chloride: 113 mmol/L — ABNORMAL HIGH (ref 101–111)
Glucose, Bld: 92 mg/dL (ref 65–99)
Potassium: 3.4 mmol/L — ABNORMAL LOW (ref 3.5–5.1)
SODIUM: 139 mmol/L (ref 135–145)

## 2016-02-08 MED ORDER — POLYETHYLENE GLYCOL 3350 17 G PO PACK
17.0000 g | PACK | Freq: Every day | ORAL | Status: DC | PRN
  Administered 2016-02-09: 17 g via ORAL
  Filled 2016-02-08: qty 1

## 2016-02-08 MED ORDER — DIPHENHYDRAMINE HCL 25 MG PO CAPS
25.0000 mg | ORAL_CAPSULE | Freq: Four times a day (QID) | ORAL | Status: DC | PRN
  Administered 2016-02-08 – 2016-02-14 (×5): 25 mg via ORAL
  Filled 2016-02-08 (×5): qty 1

## 2016-02-08 MED ORDER — MORPHINE SULFATE ER 15 MG PO TBCR
15.0000 mg | EXTENDED_RELEASE_TABLET | Freq: Two times a day (BID) | ORAL | Status: DC
  Administered 2016-02-08 – 2016-02-15 (×14): 15 mg via ORAL
  Filled 2016-02-08 (×14): qty 1

## 2016-02-08 NOTE — Progress Notes (Signed)
PROGRESS NOTE    Dawn [name]  [id number] DOB: 23-Dec-1980 DOA: 02/05/2016 PCP: Angelica Chessman, MD   Brief Narrative: 48 yof with history of sickle cell disease has been admitted for sickle cell crisis, sepsis, UTI and elevated troponin. On admission, started on empiric abx with improvement in symptoms and troponin have started to trend down.    Assessment & Plan:   Active Problems:   Fever  1. Sepsis, secondary to UTI. She was started on sepsis protocol with resolution of tachycardia, fever, and elevated lactic acid. Initially started on IV Vanc and rocephin, but is now on IV Zosyn. Blood cultures have shown no growth to date. 2. UTI, revealed on UA. Continue current treatments. Follow up UC.  3. Elevated troponin. Likely demand ischemia. Continues to trend down. Cardiology input appreciated/  4. Sickle cell anemia. Transfused 2u PRBCs with improvement in Hgb. Continue current treatments. Restart MS Contin. Continue to manage pain.     DVT prophylaxis: SCDs Code Status: Full Family Communication: Discussed with patient Disposition Plan: Anticipate discharge in 1-2 days   Consultants:   Cardiology     Procedures:  Transfused 2 units PRBC.   Antimicrobials:   Zosyn 5/15   Subjective: Continues to have joint pain consistent with her sickle cell disease. Feel short of breath on exertion.  Objective: Filed Vitals:   02/07/16 1456 02/07/16 2109 02/08/16 0030 02/08/16 0724  BP: 122/76 113/69  126/71  Pulse: 88 94  94  Temp: 97.8 F (36.6 C) 97.6 F (36.4 C) 99.1 F (37.3 C) 98.7 F (37.1 C)  TempSrc: Oral Oral Oral Oral  Resp: 20 20  20   Height:      Weight:    85.049 kg (187 lb 8 oz)  SpO2: 94% 100%  95%    Intake/Output Summary (Last 24 hours) at 02/08/16 1002 Last data filed at 02/07/16 1730  Gross per 24 hour  Intake 775.42 ml  Output      0 ml  Net 775.42 ml   Filed Weights   02/06/16 0500 02/07/16 0500 02/08/16 0724  Weight: 77.021 kg (169 lb  12.8 oz) 79.062 kg (174 lb 4.8 oz) 85.049 kg (187 lb 8 oz)    Examination: General exam: Appears calm and comfortable  Respiratory system: Clear to auscultation. Respiratory effort normal. Cardiovascular system: S1 & S2 heard, RRR. No JVD, murmurs, rubs, gallops or clicks. No pedal edema. Gastrointestinal system: Abdomen is nondistended, soft and nontender. No organomegaly or masses felt. Normal bowel sounds heard. Central nervous system: Alert and oriented. No focal neurological deficits. Extremities: Symmetric 5 x 5 power. Skin: No rashes, lesions or ulcers Psychiatry: Judgement and insight appear normal. Mood & affect appropriate.     Data Reviewed:   CBC:  Recent Labs Lab 02/04/16 0449 02/05/16 1130 02/06/16 0529 02/07/16 0813  WBC 8.7 13.1* 13.9* 14.0*  NEUTROABS 4.5 7.6 8.8*  --   HGB 7.6* 6.8* 5.5* 8.5*  HCT 21.4* 17.8* 14.3* 22.7*  MCV 99.1 93.2 92.3 86.3  PLT 189 PLATELET CLUMPS NOTED ON SMEAR, COUNT APPEARS ADEQUATE SPECIMEN CHECKED FOR CLOTS 123456*   Basic Metabolic Panel:  Recent Labs Lab 02/04/16 0449 02/05/16 1130 02/06/16 0529 02/08/16 0600  NA 134* 133* 137 139  K 3.4* 4.5 3.4* 3.4*  CL 107 106 111 113*  CO2 24 22 20* 22  GLUCOSE 111* 100* 96 92  BUN 8 9 11 18   CREATININE 0.33* <0.30* 0.32* 0.82  CALCIUM 8.3* 8.2* 7.9* 8.1*   Liver Function  Tests:  Recent Labs Lab 02/04/16 0449 02/05/16 1130 02/06/16 0529  AST 87* 180* 145*  ALT 30 39 34  ALKPHOS 138* 131* 114  BILITOT 11.5* 15.5* 17.1*  PROT 7.9 7.5 6.9  ALBUMIN 2.9* 2.9* 2.6*   Coagulation Profile:  Recent Labs Lab 02/06/16 0529  INR 1.49   Cardiac Enzymes:  Recent Labs Lab 02/06/16 0529 02/06/16 1136 02/07/16 0047 02/07/16 2333  TROPONINI 1.14* 0.96* 0.38* 0.23*     Recent Labs  02/05/16 1130  RETICCTPCT >23.0*   Urine analysis:    Component Value Date/Time   COLORURINE BROWN* 02/05/2016 1323   APPEARANCEUR HAZY* 02/05/2016 1323   LABSPEC 1.020 02/05/2016  1323   PHURINE 6.0 02/05/2016 1323   GLUCOSEU NEGATIVE 02/05/2016 1323   HGBUR LARGE* 02/05/2016 1323   BILIRUBINUR LARGE* 02/05/2016 1323   KETONESUR NEGATIVE 02/05/2016 1323   PROTEINUR >300* 02/05/2016 1323   UROBILINOGEN 1.0 09/05/2015 0917   NITRITE POSITIVE* 02/05/2016 1323   LEUKOCYTESUR NEGATIVE 02/05/2016 1323   Sepsis Labs: @LABRCNTIP (procalcitonin:4,lacticidven:4)  ) Recent Results (from the past 240 hour(s))  Urine culture     Status: Abnormal   Collection Time: 02/05/16  1:24 PM  Result Value Ref Range Status   Specimen Description URINE, CLEAN CATCH  Final   Special Requests IMMUNE:COMPROMISED  Final   Culture (A)  Final    <10,000 COLONIES/mL INSIGNIFICANT GROWTH Performed at [facility]    Report Status 02/07/2016 FINAL  Final  Blood culture (routine x 2)     Status: None (Preliminary result)   Collection Time: 02/05/16  4:05 PM  Result Value Ref Range Status   Specimen Description BLOOD LEFT HAND  Final   Special Requests BOTTLES DRAWN AEROBIC AND ANAEROBIC 6CC EACH  Final   Culture NO GROWTH 2 DAYS  Final   Report Status PENDING  Incomplete  Blood culture (routine x 2)     Status: None (Preliminary result)   Collection Time: 02/05/16  4:11 PM  Result Value Ref Range Status   Specimen Description BLOOD RIGHT HAND  Final   Special Requests BOTTLES DRAWN AEROBIC ONLY 4CC  Final   Culture NO GROWTH 2 DAYS  Final   Report Status PENDING  Incomplete  Culture, blood (x 2)     Status: None (Preliminary result)   Collection Time: 02/06/16  5:29 AM  Result Value Ref Range Status   Specimen Description BLOOD LEFT WRIST  Final   Special Requests BOTTLES DRAWN AEROBIC ONLY 6CC  Final   Culture NO GROWTH 1 DAY  Final   Report Status PENDING  Incomplete         Radiology Studies: No results found.      Scheduled Meds: . sodium chloride   Intravenous Once  . aspirin  325 mg Oral Daily  . folic acid  1 mg Oral Daily  . hydroxyurea  1,000 mg  Oral Daily  . loratadine  10 mg Oral Daily  . piperacillin-tazobactam (ZOSYN)  IV  3.375 g Intravenous Q8H  . sodium chloride flush  3 mL Intravenous Q12H  . [START ON 02/12/2016] Vitamin D (Ergocalciferol)  50,000 Units Oral Q7 days   Continuous Infusions: . sodium chloride 125 mL/hr at 02/08/16 0606     LOS: 2 days    Time spent: 27mins    Kathie Dike, MD [facility] Pager [phone]  If 7PM-7AM, please contact night-coverage [email or web address] Password [email or web address] 02/08/2016, 10:02 AM

## 2016-02-09 LAB — BASIC METABOLIC PANEL
Anion gap: 5 (ref 5–15)
BUN: 12 mg/dL (ref 6–20)
CALCIUM: 8.3 mg/dL — AB (ref 8.9–10.3)
CO2: 22 mmol/L (ref 22–32)
CREATININE: 0.72 mg/dL (ref 0.44–1.00)
Chloride: 112 mmol/L — ABNORMAL HIGH (ref 101–111)
Glucose, Bld: 90 mg/dL (ref 65–99)
Potassium: 3.1 mmol/L — ABNORMAL LOW (ref 3.5–5.1)
SODIUM: 139 mmol/L (ref 135–145)

## 2016-02-09 LAB — CBC
HCT: 19.9 % — ABNORMAL LOW (ref 36.0–46.0)
HEMOGLOBIN: 7.2 g/dL — AB (ref 12.0–15.0)
MCH: 32.6 pg (ref 26.0–34.0)
MCHC: 36.2 g/dL — AB (ref 30.0–36.0)
MCV: 90 fL (ref 78.0–100.0)
PLATELETS: 108 10*3/uL — AB (ref 150–400)
RBC: 2.21 MIL/uL — ABNORMAL LOW (ref 3.87–5.11)
RDW: 27.2 % — AB (ref 11.5–15.5)
WBC: 14.8 10*3/uL — ABNORMAL HIGH (ref 4.0–10.5)

## 2016-02-09 MED ORDER — HYDROMORPHONE 1 MG/ML IV SOLN
INTRAVENOUS | Status: DC
  Administered 2016-02-09: 8.3 mg via INTRAVENOUS
  Administered 2016-02-09: 15:00:00 via INTRAVENOUS
  Administered 2016-02-10: 0.7 mg via INTRAVENOUS
  Administered 2016-02-10: 1 mg via INTRAVENOUS
  Administered 2016-02-10: 2.8 mg via INTRAVENOUS
  Administered 2016-02-10: 4.2 mg via INTRAVENOUS
  Administered 2016-02-10: 3.5 mg via INTRAVENOUS
  Administered 2016-02-11: 0.7 mg via INTRAVENOUS
  Administered 2016-02-11 (×2): 1.4 mg via INTRAVENOUS
  Administered 2016-02-13: 1.2 mg via INTRAVENOUS
  Filled 2016-02-09 (×3): qty 25

## 2016-02-09 MED ORDER — SODIUM CHLORIDE 0.9 % IV SOLN: 12.5000 mg | Freq: Four times a day (QID) | INTRAVENOUS | Status: DC | PRN

## 2016-02-09 MED ORDER — SODIUM CHLORIDE 0.9% FLUSH: 9.0000 mL | INTRAVENOUS | Status: DC | PRN

## 2016-02-09 MED ORDER — ONDANSETRON HCL 4 MG/2ML IJ SOLN
4.0000 mg | Freq: Four times a day (QID) | INTRAMUSCULAR | Status: DC | PRN
  Administered 2016-02-12: 4 mg via INTRAVENOUS
  Filled 2016-02-09: qty 2

## 2016-02-09 MED ORDER — POTASSIUM CHLORIDE CRYS ER 20 MEQ PO TBCR
40.0000 meq | EXTENDED_RELEASE_TABLET | ORAL | Status: AC
  Administered 2016-02-09 (×2): 40 meq via ORAL
  Filled 2016-02-09 (×2): qty 2

## 2016-02-09 MED ORDER — KETOROLAC TROMETHAMINE 30 MG/ML IJ SOLN
30.0000 mg | Freq: Four times a day (QID) | INTRAMUSCULAR | Status: AC
Start: 2016-02-09 — End: 2016-02-11
  Administered 2016-02-09 – 2016-02-11 (×8): 30 mg via INTRAVENOUS
  Filled 2016-02-09 (×8): qty 1

## 2016-02-09 MED ORDER — DIPHENHYDRAMINE HCL 12.5 MG/5ML PO ELIX: 12.5000 mg | ORAL_SOLUTION | Freq: Four times a day (QID) | ORAL | Status: DC | PRN

## 2016-02-09 MED ORDER — NALOXONE HCL 0.4 MG/ML IJ SOLN: 0.4000 mg | INTRAMUSCULAR | Status: DC | PRN

## 2016-02-09 NOTE — Progress Notes (Signed)
MEDICATION RELATED CONSULT NOTE - INITIAL   Pharmacy Consult for DILAUDID PCA FOR SICKLE CELL CRISIS Indication: sickle cell pain / crisis  Allergies  Allergen Reactions  . Desferal [Deferoxamine] Hives    Local reaction on arm only during infusion. Can take with benadryl   . Latex Other (See Comments)    REACTION: Pt experiences a burning sensation on contacted skin areas  . Lisinopril Other (See Comments) and Cough    REACTION: Sore/scratchy throat  . Tape Other (See Comments)    REACTION: Pt. Experiences a burning sensation on contacted skin areas   Patient Measurements: Height: 5\' 3"  (160 cm) Weight: 186 lb 6.4 oz (84.55 kg) IBW/kg (Calculated) : 52.4  Vital Signs: Temp: 97.9 F (36.6 C) (05/18 0706) Temp Source: Oral (05/18 0706) BP: 125/75 mmHg (05/18 0706) Pulse Rate: 87 (05/18 0706) Intake/Output from previous day: 05/17 0701 - 05/18 0700 In: 360 [P.O.:360] Out: 600 [Urine:600] Intake/Output from this shift: Total I/O In: 480 [P.O.:480] Out: -   Labs:  Recent Labs  02/07/16 0813 02/08/16 0600 02/09/16 0627  WBC 14.0*  --  14.8*  HGB 8.5*  --  7.2*  HCT 22.7*  --  19.9*  PLT 120*  --  108*  CREATININE  --  0.82 0.72   Estimated Creatinine Clearance: 101.2 mL/min (by C-G formula based on Cr of 0.72).  Microbiology: Recent Results (from the past 720 hour(s))  Urine culture     Status: Abnormal   Collection Time: 02/05/16  1:24 PM  Result Value Ref Range Status   Specimen Description URINE, CLEAN CATCH  Final   Special Requests IMMUNE:COMPROMISED  Final   Culture (A)  Final    <10,000 COLONIES/mL INSIGNIFICANT GROWTH Performed at [facility]    Report Status 02/07/2016 FINAL  Final  Blood culture (routine x 2)     Status: None (Preliminary result)   Collection Time: 02/05/16  4:05 PM  Result Value Ref Range Status   Specimen Description BLOOD LEFT HAND  Final   Special Requests BOTTLES DRAWN AEROBIC AND ANAEROBIC 6CC EACH  Final   Culture NO GROWTH 4 DAYS  Final   Report Status PENDING  Incomplete  Blood culture (routine x 2)     Status: None (Preliminary result)   Collection Time: 02/05/16  4:11 PM  Result Value Ref Range Status   Specimen Description BLOOD RIGHT HAND  Final   Special Requests BOTTLES DRAWN AEROBIC ONLY 4CC  Final   Culture NO GROWTH 4 DAYS  Final   Report Status PENDING  Incomplete  Culture, blood (x 2)     Status: None (Preliminary result)   Collection Time: 02/06/16  5:29 AM  Result Value Ref Range Status   Specimen Description BLOOD LEFT WRIST  Final   Special Requests BOTTLES DRAWN AEROBIC ONLY [place]  Final   Culture NO GROWTH 3 DAYS  Final   Report Status PENDING  Incomplete   Medical History: Past Medical History  Diagnosis Date  . Sickle cell disease ([place])   . 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 Blood transfusion   . 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 HCAP (healthcare-associated pneumonia) 05/19/2013  . Sickle cell anemia ([facility])   . 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 Vitamin B12 deficiency 07/07/2015   Medications:  Scheduled:  . sodium chloride   Intravenous Once  . aspirin  325 mg Oral Daily  . folic acid  1 mg Oral Daily  . HYDROmorphone   Intravenous  Q4H  . hydroxyurea  1,000 mg Oral Daily  . ketorolac  30 mg Intravenous Q6H  . loratadine  10 mg Oral Daily  . morphine  15 mg Oral Q12H  . potassium chloride  40 mEq Oral Q3H  . sodium chloride flush  3 mL Intravenous Q12H  . [START ON 02/12/2016] Vitamin D (Ergocalciferol)  50,000 Units Oral Q7 days   Assessment:  MD note reviewed:   1. Sickle cell anemia, with crisis. Patient continues to have significant pain in her legs, back and arms which is not controlled with current pain regimen. Will continue patient on MS contin but discontinue short-acting pain medications in favor of Dilaudid PCA.   Goal of Therapy:  Pain control  Plan:  Continue current PCA as ordered If pain unresolved, can switch to  SICKLE CELL order set with higher dosing and additional bolus dosing that patient can supplement Monitor progress / response.    Hart Robinsons A 02/09/2016,2:37 PM

## 2016-02-09 NOTE — Progress Notes (Signed)
PROGRESS NOTE    Dawn [name]  [id number] DOB: May 06, 1981 DOA: 02/05/2016 PCP: Angelica Chessman, MD   Brief Narrative: 78 yof with history of sickle cell disease has been admitted for sickle cell crisis, sepsis, UTI and elevated troponin. On admission, started on empiric abx with improvement in symptoms and troponin have started to trend down.   Assessment & Plan: Active Problems:   Fever  1. Sepsis, secondary to UTI. She was started on sepsis protocol with resolution of tachycardia, fever, and elevated lactic acid. Initially started on IV Vanc and rocephin, switched to IV Zosyn. Since she has been afebrile and hemodynamically stable for several days now with no positive cultures. Will discontinue further abx.   2. Elevated troponin. Likely demand ischemia. Continues to trend down. Cardiology input appreciated. 3. Sickle cell anemia, with crisis. Patient continues to have significant pain in her legs, back and arms which is not controlled with current pain regimen.  Will continue patient on MS contin but discontinue short-acting pain medications in favor of Dilaudid PCA. Transfused 2u PRBCs with improvement in Hgb. Recheck CBC in the am  4. Hypokalemia. Replete.     DVT prophylaxis: SCDs Code Status: Full Family Communication: No family bedside Disposition Plan: Anticipate discharge in 1-2 days   Consultants:   Cardiology     Procedures:  Transfused 2 units PRBC.   ECHO: Study Conclusions  - Left ventricle: The cavity size was normal. Wall thickness was  increased in a pattern of mild LVH. Systolic function was  vigorous. The estimated ejection fraction was in the range of 65%  to 70%. Diastolic function is inderminate, cannot assess in  presence of significant tachycardia. Wall motion was normal;  there were no regional wall motion abnormalities. - Aortic valve: Valve area (VTI): 2.19 cm^2. Valve area (Vmax):  2.01 cm^2. - Left atrium: The atrium was severely  dilated. - Right atrium: The atrium was moderately dilated. - Tricuspid valve: There was moderate regurgitation. - Pulmonary arteries: Systolic pressure was moderately to severely  increased. PA peak pressure: 49 mm Hg (S). - Systemic veins: IVC is small, suggesting low RA pressure and  hypovolemia. - Technically adequate study.   Antimicrobials:   Zosyn 5/15>>5/18   Subjective: Feels as bad as yesterday. Pain in arms, legs and lower back. Shortness of breath has resolved. Otherwise no other complaints.   Objective: Filed Vitals:   02/08/16 1341 02/08/16 2024 02/08/16 2153 02/09/16 0706  BP: 123/74  121/77 125/75  Pulse: 85  89 87  Temp: 98 F (36.7 C)  97.9 F (36.6 C) 97.9 F (36.6 C)  TempSrc: Oral  Oral Oral  Resp: 20  16 18   Height:      Weight:    84.55 kg (186 lb 6.4 oz)  SpO2: 99% 97% 100% 100%    Intake/Output Summary (Last 24 hours) at 02/09/16 0740 Last data filed at 02/08/16 1731  Gross per 24 hour  Intake    360 ml  Output    600 ml  Net   -240 ml   Filed Weights   02/07/16 0500 02/08/16 0724 02/09/16 0706  Weight: 79.062 kg (174 lb 4.8 oz) 85.049 kg (187 lb 8 oz) 84.55 kg (186 lb 6.4 oz)    Examination: General exam: Appears calm and comfortable  Respiratory system: Clear to auscultation. Respiratory effort normal. Cardiovascular system: S1 & S2 heard, RRR. No JVD, murmurs, rubs, gallops or clicks. No pedal edema. Gastrointestinal system: Abdomen is nondistended, soft and nontender. No  organomegaly or masses felt. Normal bowel sounds heard. Central nervous system: Alert and oriented. No focal neurological deficits. Extremities: Symmetric 5 x 5 power. Skin: No rashes, lesions or ulcers Psychiatry: Judgement and insight appear normal. Mood & affect appropriate.     Data Reviewed:   CBC:  Recent Labs Lab 02/04/16 0449 02/05/16 1130 02/06/16 0529 02/07/16 0813 02/09/16 0627  WBC 8.7 13.1* 13.9* 14.0* 14.8*  NEUTROABS 4.5 7.6 8.8*  --    --   HGB 7.6* 6.8* 5.5* 8.5* 7.2*  HCT 21.4* 17.8* 14.3* 22.7* 19.9*  MCV 99.1 93.2 92.3 86.3 90.0  PLT 189 PLATELET CLUMPS NOTED ON SMEAR, COUNT APPEARS ADEQUATE SPECIMEN CHECKED FOR CLOTS 120* [id number]*   Basic Metabolic Panel:  Recent Labs Lab 02/04/16 0449 02/05/16 1130 02/06/16 0529 02/08/16 0600 02/09/16 0627  NA 134* 133* 137 139 139  K 3.4* 4.5 3.4* 3.4* 3.1*  CL 107 106 111 113* 112*  CO2 24 22 20* 22 22  GLUCOSE 111* 100* 96 92 90  BUN 8 9 11 18 12   CREATININE 0.33* <0.30* 0.32* 0.82 0.72  CALCIUM 8.3* 8.2* 7.9* 8.1* 8.3*   Liver Function Tests:  Recent Labs Lab 02/04/16 0449 02/05/16 1130 02/06/16 0529  AST 87* 180* 145*  ALT 30 39 34  ALKPHOS 138* 131* 114  BILITOT 11.5* 15.5* 17.1*  PROT 7.9 7.5 6.9  ALBUMIN 2.9* 2.9* 2.6*   Coagulation Profile:  Recent Labs Lab 02/06/16 0529  INR 1.49   Cardiac Enzymes:  Recent Labs Lab 02/06/16 0529 02/06/16 1136 02/07/16 0047 02/07/16 2333  TROPONINI 1.14* 0.96* 0.38* 0.23*    No results for input(s): VITAMINB12, FOLATE, FERRITIN, TIBC, IRON, RETICCTPCT in the last 72 hours. Urine analysis:    Component Value Date/Time   COLORURINE BROWN* 02/05/2016 1323   APPEARANCEUR HAZY* 02/05/2016 1323   LABSPEC 1.020 02/05/2016 1323   PHURINE 6.0 02/05/2016 1323   GLUCOSEU NEGATIVE 02/05/2016 1323   HGBUR LARGE* 02/05/2016 1323   BILIRUBINUR LARGE* 02/05/2016 1323   KETONESUR NEGATIVE 02/05/2016 1323   PROTEINUR >300* 02/05/2016 1323   UROBILINOGEN 1.0 09/05/2015 0917   NITRITE POSITIVE* 02/05/2016 1323   LEUKOCYTESUR NEGATIVE 02/05/2016 1323   Sepsis Labs: @LABRCNTIP (procalcitonin:4,lacticidven:4)  ) Recent Results (from the past 240 hour(s))  Urine culture     Status: Abnormal   Collection Time: 02/05/16  1:24 PM  Result Value Ref Range Status   Specimen Description URINE, CLEAN CATCH  Final   Special Requests IMMUNE:COMPROMISED  Final   Culture (A)  Final    <10,000 COLONIES/mL INSIGNIFICANT  GROWTH Performed at [facility]    Report Status 02/07/2016 FINAL  Final  Blood culture (routine x 2)     Status: None (Preliminary result)   Collection Time: 02/05/16  4:05 PM  Result Value Ref Range Status   Specimen Description BLOOD LEFT HAND  Final   Special Requests BOTTLES DRAWN AEROBIC AND ANAEROBIC 6CC EACH  Final   Culture NO GROWTH 3 DAYS  Final   Report Status PENDING  Incomplete  Blood culture (routine x 2)     Status: None (Preliminary result)   Collection Time: 02/05/16  4:11 PM  Result Value Ref Range Status   Specimen Description BLOOD RIGHT HAND  Final   Special Requests BOTTLES DRAWN AEROBIC ONLY 4CC  Final   Culture NO GROWTH 3 DAYS  Final   Report Status PENDING  Incomplete  Culture, blood (x 2)     Status: None (Preliminary result)   Collection  Time: 02/06/16  5:29 AM  Result Value Ref Range Status   Specimen Description BLOOD LEFT WRIST  Final   Special Requests BOTTLES DRAWN AEROBIC ONLY 6CC  Final   Culture NO GROWTH 2 DAYS  Final   Report Status PENDING  Incomplete         Radiology Studies: No results found.      Scheduled Meds: . sodium chloride   Intravenous Once  . aspirin  325 mg Oral Daily  . folic acid  1 mg Oral Daily  . hydroxyurea  1,000 mg Oral Daily  . loratadine  10 mg Oral Daily  . morphine  15 mg Oral Q12H  . piperacillin-tazobactam (ZOSYN)  IV  3.375 g Intravenous Q8H  . sodium chloride flush  3 mL Intravenous Q12H  . [START ON 02/12/2016] Vitamin D (Ergocalciferol)  50,000 Units Oral Q7 days   Continuous Infusions:     LOS: 3 days    Time spent: 65mins    Kathie Dike, MD [facility] Pager [phone]  If 7PM-7AM, please contact night-coverage [email or web address] Password [email or web address] 02/09/2016, 7:40 AM    By signing my name below, I, Rennis Harding, attest that this documentation has been prepared under the direction and in the presence of Kathie Dike, MD. Electronically signed: Rennis Harding, Scribe. 02/09/2016 1:53pm    I, Dr. Kathie Dike, personally performed the services described in this documentaiton. All medical record entries made by the scribe were at my direction and in my presence. I have reviewed the chart and agree that the record reflects my personal performance and is accurate and complete  Kathie Dike, MD, 02/09/2016 2:14 PM

## 2016-02-10 DIAGNOSIS — E876 Hypokalemia: Secondary | ICD-10-CM

## 2016-02-10 LAB — CBC
HEMATOCRIT: 21.4 % — AB (ref 36.0–46.0)
Hemoglobin: 7.6 g/dL — ABNORMAL LOW (ref 12.0–15.0)
MCH: 32.2 pg (ref 26.0–34.0)
MCHC: 35.5 g/dL (ref 30.0–36.0)
MCV: 90.7 fL (ref 78.0–100.0)
Platelets: 84 10*3/uL — ABNORMAL LOW (ref 150–400)
RBC: 2.36 MIL/uL — AB (ref 3.87–5.11)
RDW: 29 % — AB (ref 11.5–15.5)
WBC: 5.7 10*3/uL (ref 4.0–10.5)

## 2016-02-10 LAB — COMPREHENSIVE METABOLIC PANEL
ALBUMIN: 2.4 g/dL — AB (ref 3.5–5.0)
ALT: 29 U/L (ref 14–54)
AST: 76 U/L — AB (ref 15–41)
Alkaline Phosphatase: 89 U/L (ref 38–126)
Anion gap: 6 (ref 5–15)
BILIRUBIN TOTAL: 13.8 mg/dL — AB (ref 0.3–1.2)
BUN: 13 mg/dL (ref 6–20)
CHLORIDE: 110 mmol/L (ref 101–111)
CO2: 23 mmol/L (ref 22–32)
CREATININE: 0.81 mg/dL (ref 0.44–1.00)
Calcium: 8.2 mg/dL — ABNORMAL LOW (ref 8.9–10.3)
GFR calc Af Amer: >60 mL/min (ref >60)
GLUCOSE: 90 mg/dL (ref 65–99)
POTASSIUM: 3.1 mmol/L — AB (ref 3.5–5.1)
Sodium: 139 mmol/L (ref 135–145)
TOTAL PROTEIN: 7.2 g/dL (ref 6.5–8.1)

## 2016-02-10 LAB — CULTURE, BLOOD (ROUTINE X 2)
CULTURE: NO GROWTH
Culture: NO GROWTH

## 2016-02-10 MED ORDER — POTASSIUM CHLORIDE 10 MEQ/100ML IV SOLN
10.0000 meq | INTRAVENOUS | Status: AC
  Administered 2016-02-10 – 2016-02-11 (×3): 10 meq via INTRAVENOUS
  Filled 2016-02-10 (×3): qty 100

## 2016-02-10 MED ORDER — POTASSIUM CHLORIDE CRYS ER 20 MEQ PO TBCR
40.0000 meq | EXTENDED_RELEASE_TABLET | ORAL | Status: DC
  Filled 2016-02-10: qty 2

## 2016-02-10 NOTE — Progress Notes (Signed)
PROGRESS NOTE    Dawn [name]  [id number] DOB: 06/07/1981 DOA: 02/05/2016 PCP: Angelica Chessman, MD   Brief Narrative: 48 yof with history of sickle cell disease has been admitted for sickle cell crisis, sepsis, UTI and elevated troponin. On admission, started on empiric abx with improvement in symptoms and troponin have started to trend down.   Assessment & Plan: Active Problems:   Fever  1. Sepsis. She was started on sepsis protocol with resolution of tachycardia, fever, and elevated lactic acid. Initially started on IV Vanc and rocephin, switched to IV Zosyn. Since she has been afebrile and hemodynamically stable for several days now with no positive cultures, abx has been discontinued.    2. Elevated troponin. Likely demand ischemia. Continues to trend down. No further inpatient workup.   3. Sickle cell anemia, with crisis. Improving. Hgb remains stable S/P transfused 2 units PRBC. Pain well-controlled with MC contin and Dilaudid PCA. Continue current treatments. Potentially wean off PCA tomorrow.  4. Hypokalemia. Continue to replete.    DVT prophylaxis: SCDs Code Status: Full Family Communication: No family bedside Disposition Plan: Anticipate discharge in 1-2 days  Consultants:   Cardiology     Procedures:  Transfused 2 units PRBC 5/15.   ECHO: Study Conclusions  - Left ventricle: The cavity size was normal. Wall thickness was  increased in a pattern of mild LVH. Systolic function was  vigorous. The estimated ejection fraction was in the range of 65%  to 70%. Diastolic function is inderminate, cannot assess in  presence of significant tachycardia. Wall motion was normal;  there were no regional wall motion abnormalities. - Aortic valve: Valve area (VTI): 2.19 cm^2. Valve area (Vmax):  2.01 cm^2. - Left atrium: The atrium was severely dilated. - Right atrium: The atrium was moderately dilated. - Tricuspid valve: There was moderate regurgitation. - Pulmonary  arteries: Systolic pressure was moderately to severely  increased. PA peak pressure: 49 mm Hg (S). - Systemic veins: IVC is small, suggesting low RA pressure and  hypovolemia. - Technically adequate study.  Antimicrobials:   Zosyn 5/15>>5/18   Subjective: Feeling better. Breathing has improved and pain is well-controlled. Is urinating without difficulty.  Objective: Filed Vitals:   02/10/16 0000 02/10/16 0400 02/10/16 0432 02/10/16 0525  BP:   127/90   Pulse:   71   Temp:   97.7 F (36.5 C)   TempSrc:   Oral   Resp: 18 16 19    Height:      Weight:    81.285 kg (179 lb 3.2 oz)  SpO2: 99% 99% 100%     Intake/Output Summary (Last 24 hours) at 02/10/16 0740 Last data filed at 02/09/16 1300  Gross per 24 hour  Intake    480 ml  Output      0 ml  Net    480 ml   Filed Weights   02/08/16 0724 02/09/16 0706 02/10/16 0525  Weight: 85.049 kg (187 lb 8 oz) 84.55 kg (186 lb 6.4 oz) 81.285 kg (179 lb 3.2 oz)    Examination: General exam:NAD  Respiratory system: Clear to auscultation. Respiratory effort normal. Cardiovascular system: S1 & S2 heard, RRR. No JVD, murmurs, rubs, gallops or clicks. No pedal edema. Gastrointestinal system: Abdomen is nondistended, soft and nontender. No organomegaly or masses felt. Normal bowel sounds heard. Central nervous system: Alert and oriented. No focal neurological deficits. Psychiatry: Judgement and insight appear normal. Mood & affect appropriate.   Data Reviewed:   CBC:  Recent Labs Lab  02/04/16 0449 02/05/16 1130 02/06/16 0529 02/07/16 0813 02/09/16 0627 02/10/16 0632  WBC 8.7 13.1* 13.9* 14.0* 14.8* 5.7  NEUTROABS 4.5 7.6 8.8*  --   --   --   HGB 7.6* 6.8* 5.5* 8.5* 7.2* 7.6*  HCT 21.4* 17.8* 14.3* 22.7* 19.9* 21.4*  MCV 99.1 93.2 92.3 86.3 90.0 90.7  PLT 189 PLATELET CLUMPS NOTED ON SMEAR, COUNT APPEARS ADEQUATE SPECIMEN CHECKED FOR CLOTS 120* 108* 84*   Basic Metabolic Panel:  Recent Labs Lab 02/05/16 1130  02/06/16 0529 02/08/16 0600 02/09/16 0627 02/10/16 0632  NA 133* 137 139 139 139  K 4.5 3.4* 3.4* 3.1* 3.1*  CL 106 111 113* 112* 110  CO2 22 20* 22 22 23   GLUCOSE 100* 96 92 90 90  BUN 9 11 18 12 13   CREATININE <0.30* 0.32* 0.82 0.72 0.81  CALCIUM 8.2* 7.9* 8.1* 8.3* 8.2*   Liver Function Tests:  Recent Labs Lab 02/04/16 0449 02/05/16 1130 02/06/16 0529 02/10/16 0632  AST 87* 180* 145* 76*  ALT 30 39 34 29  ALKPHOS 138* 131* 114 89  BILITOT 11.5* 15.5* 17.1* 13.8*  PROT 7.9 7.5 6.9 7.2  ALBUMIN 2.9* 2.9* 2.6* 2.4*   Coagulation Profile:  Recent Labs Lab 02/06/16 0529  INR 1.49   Cardiac Enzymes:  Recent Labs Lab 02/06/16 0529 02/06/16 1136 02/07/16 0047 02/07/16 2333  TROPONINI 1.14* 0.96* 0.38* 0.23*    No results for input(s): VITAMINB12, FOLATE, FERRITIN, TIBC, IRON, RETICCTPCT in the last 72 hours. Urine analysis:    Component Value Date/Time   COLORURINE BROWN* 02/05/2016 1323   APPEARANCEUR HAZY* 02/05/2016 1323   LABSPEC 1.020 02/05/2016 1323   PHURINE 6.0 02/05/2016 1323   GLUCOSEU NEGATIVE 02/05/2016 1323   HGBUR LARGE* 02/05/2016 1323   BILIRUBINUR LARGE* 02/05/2016 1323   KETONESUR NEGATIVE 02/05/2016 1323   PROTEINUR >300* 02/05/2016 1323   UROBILINOGEN 1.0 09/05/2015 0917   NITRITE POSITIVE* 02/05/2016 1323   LEUKOCYTESUR NEGATIVE 02/05/2016 1323   Sepsis Labs: @LABRCNTIP (procalcitonin:4,lacticidven:4)  ) Recent Results (from the past 240 hour(s))  Urine culture     Status: Abnormal   Collection Time: 02/05/16  1:24 PM  Result Value Ref Range Status   Specimen Description URINE, CLEAN CATCH  Final   Special Requests IMMUNE:COMPROMISED  Final   Culture (A)  Final    <10,000 COLONIES/mL INSIGNIFICANT GROWTH Performed at [facility]    Report Status 02/07/2016 FINAL  Final  Blood culture (routine x 2)     Status: None (Preliminary result)   Collection Time: 02/05/16  4:05 PM  Result Value Ref Range Status   Specimen  Description BLOOD LEFT HAND  Final   Special Requests BOTTLES DRAWN AEROBIC AND ANAEROBIC 6CC EACH  Final   Culture NO GROWTH 4 DAYS  Final   Report Status PENDING  Incomplete  Blood culture (routine x 2)     Status: None (Preliminary result)   Collection Time: 02/05/16  4:11 PM  Result Value Ref Range Status   Specimen Description BLOOD RIGHT HAND  Final   Special Requests BOTTLES DRAWN AEROBIC ONLY 4CC  Final   Culture NO GROWTH 4 DAYS  Final   Report Status PENDING  Incomplete  Culture, blood (x 2)     Status: None (Preliminary result)   Collection Time: 02/06/16  5:29 AM  Result Value Ref Range Status   Specimen Description BLOOD LEFT WRIST  Final   Special Requests BOTTLES DRAWN AEROBIC ONLY [place]  Final   Culture NO  GROWTH 3 DAYS  Final   Report Status PENDING  Incomplete         Radiology Studies: No results found.      Scheduled Meds: . sodium chloride   Intravenous Once  . aspirin  325 mg Oral Daily  . folic acid  1 mg Oral Daily  . HYDROmorphone   Intravenous Q4H  . hydroxyurea  1,000 mg Oral Daily  . ketorolac  30 mg Intravenous Q6H  . loratadine  10 mg Oral Daily  . morphine  15 mg Oral Q12H  . sodium chloride flush  3 mL Intravenous Q12H  . [START ON 02/12/2016] Vitamin D (Ergocalciferol)  50,000 Units Oral Q7 days   Continuous Infusions:     LOS: 4 days    Time spent: 15 mins  Kathie Dike, MD [facility] Pager [phone]  If 7PM-7AM, please contact night-coverage [email or web address] Password [email or web address] 02/10/2016, 7:40 AM    By signing my name below, I, Rennis Harding, attest that this documentation has been prepared under the direction and in the presence of Kathie Dike, MD. Electronically signed: Rennis Harding, Scribe. 02/10/2016 2:17pm   I, Dr. Kathie Dike, personally performed the services described in this documentaiton. All medical record entries made by the scribe were at my direction and in my presence. I have reviewed the  chart and agree that the record reflects my personal performance and is accurate and complete  Kathie Dike, MD, 02/10/2016 2:30 PM

## 2016-02-10 NOTE — Progress Notes (Signed)
Notified Dr. Roderic Palau that the patient has not been able to tolerate the po potassium.  She vomits whe she attempts to take it po.  Recommended to give the medication iv.  New orders given and followed.  Voiced to him that the patient also reported the same thing happened yesterday.

## 2016-02-11 DIAGNOSIS — D649 Anemia, unspecified: Secondary | ICD-10-CM

## 2016-02-11 LAB — CBC
HCT: 19.1 % — ABNORMAL LOW (ref 36.0–46.0)
HEMATOCRIT: 19.6 % — AB (ref 36.0–46.0)
HEMOGLOBIN: 6.6 g/dL — AB (ref 12.0–15.0)
HEMOGLOBIN: 6.7 g/dL — AB (ref 12.0–15.0)
MCH: 31.6 pg (ref 26.0–34.0)
MCH: 31.5 pg (ref 26.0–34.0)
MCHC: 34.6 g/dL (ref 30.0–36.0)
MCHC: 34.2 g/dL (ref 30.0–36.0)
MCV: 91.4 fL (ref 78.0–100.0)
MCV: 92 fL (ref 78.0–100.0)
PLATELETS: 92 10*3/uL — AB (ref 150–400)
Platelets: 30 10*3/uL — ABNORMAL LOW (ref 150–400)
RBC: 2.09 MIL/uL — AB (ref 3.87–5.11)
RBC: 2.13 MIL/uL — ABNORMAL LOW (ref 3.87–5.11)
RDW: 28.8 % — ABNORMAL HIGH (ref 11.5–15.5)
RDW: 28.5 % — ABNORMAL HIGH (ref 11.5–15.5)
WBC: 9.6 10*3/uL (ref 4.0–10.5)
WBC: 9.7 10*3/uL (ref 4.0–10.5)

## 2016-02-11 LAB — COMPREHENSIVE METABOLIC PANEL
ALBUMIN: 2.2 g/dL — AB (ref 3.5–5.0)
ALK PHOS: 101 U/L (ref 38–126)
ALT: 28 U/L (ref 14–54)
AST: 67 U/L — ABNORMAL HIGH (ref 15–41)
Anion gap: 4 — ABNORMAL LOW (ref 5–15)
BUN: 13 mg/dL (ref 6–20)
CALCIUM: 8.2 mg/dL — AB (ref 8.9–10.3)
CHLORIDE: 113 mmol/L — AB (ref 101–111)
CO2: 24 mmol/L (ref 22–32)
CREATININE: 0.9 mg/dL (ref 0.44–1.00)
GFR calc Af Amer: >60 mL/min (ref >60)
GFR calc non Af Amer: >60 mL/min (ref >60)
GLUCOSE: 91 mg/dL (ref 65–99)
Potassium: 3.7 mmol/L (ref 3.5–5.1)
SODIUM: 141 mmol/L (ref 135–145)
Total Bilirubin: 11.2 mg/dL — ABNORMAL HIGH (ref 0.3–1.2)
Total Protein: 6.7 g/dL (ref 6.5–8.1)

## 2016-02-11 LAB — CULTURE, BLOOD (ROUTINE X 2): Culture: NO GROWTH

## 2016-02-11 MED ORDER — PROCHLORPERAZINE EDISYLATE 5 MG/ML IJ SOLN
10.0000 mg | Freq: Four times a day (QID) | INTRAMUSCULAR | Status: DC | PRN
  Administered 2016-02-11: 10 mg via INTRAVENOUS
  Filled 2016-02-11: qty 2

## 2016-02-11 MED ORDER — POTASSIUM CHLORIDE 10 MEQ/100ML IV SOLN
INTRAVENOUS | Status: AC
  Administered 2016-02-11: 10 meq via INTRAVENOUS
  Filled 2016-02-11: qty 100

## 2016-02-11 NOTE — Progress Notes (Signed)
PROGRESS NOTE    Dawn [name]  [id number] DOB: 11-25-1980 DOA: 02/05/2016 PCP: Angelica Chessman, MD   Brief Narrative: 54 yof with history of sickle cell disease has been admitted for sickle cell crisis, sepsis, UTI and elevated troponin. On admission, started on empiric abx with improvement in symptoms and troponin have started to trend down.   Assessment & Plan: Active Problems:   Fever  1. Sepsis. Resolved. She was started on sepsis protocol with resolution of tachycardia, fever, and elevated lactic acid. Initially started on IV Vanc and rocephin, switched to IV Zosyn. Since she has been afebrile and hemodynamically stable for several days now with no positive cultures, abx has been discontinued.    2. Elevated troponin. Likely demand ischemia. Continues to trend down. No further inpatient workup.   3. Sickle cell anemia, with crisis. Transfused 2 units PRBC 5/15. This morning Hgb noted 6.6. Will recheck in the evening and if noted to drop lower will transfuse an additional 1U PRBCS. No evidence of bleeding. Pain is still uncontrolled with MC contin and Dilaudid PCA. Continue current treatments. Anticipate weaning off PCA tomorrow.  4. Hypokalemia. Replaced.    DVT prophylaxis: SCDs Code Status: Full Family Communication: No family bedside Disposition Plan: Anticipate discharge home once improved.    Consultants:   Cardiology     Procedures:  Transfused 2 units PRBC 5/15.   ECHO: Study Conclusions  - Left ventricle: The cavity size was normal. Wall thickness was  increased in a pattern of mild LVH. Systolic function was  vigorous. The estimated ejection fraction was in the range of 65%  to 70%. Diastolic function is inderminate, cannot assess in  presence of significant tachycardia. Wall motion was normal;  there were no regional wall motion abnormalities. - Aortic valve: Valve area (VTI): 2.19 cm^2. Valve area (Vmax):  2.01 cm^2. - Left atrium: The atrium was  severely dilated. - Right atrium: The atrium was moderately dilated. - Tricuspid valve: There was moderate regurgitation. - Pulmonary arteries: Systolic pressure was moderately to severely  increased. PA peak pressure: 49 mm Hg (S). - Systemic veins: IVC is small, suggesting low RA pressure and  hypovolemia. - Technically adequate study.  Antimicrobials:   Zosyn 5/15>>5/18   Subjective: Pain is about the same. Controlled well with medications. Otherwise no complaints.   Objective: Filed Vitals:   02/11/16 0400 02/11/16 0402 02/11/16 0643 02/11/16 0753  BP:  130/76    Pulse:  78    Temp:  98.5 F (36.9 C)    TempSrc:  Oral    Resp: 14 20  12   Height:      Weight:   80.922 kg (178 lb 6.4 oz)   SpO2: 100% 100%  100%    Intake/Output Summary (Last 24 hours) at 02/11/16 0818 Last data filed at 02/10/16 0900  Gross per 24 hour  Intake    240 ml  Output      0 ml  Net    240 ml   Filed Weights   02/09/16 0706 02/10/16 0525 02/11/16 0643  Weight: 84.55 kg (186 lb 6.4 oz) 81.285 kg (179 lb 3.2 oz) 80.922 kg (178 lb 6.4 oz)    Examination: General exam:NAD  Respiratory system: Clear to auscultation. Respiratory effort normal. Cardiovascular system: S1 & S2 heard, RRR. No JVD, murmurs, rubs, gallops or clicks. No pedal edema.Marland Kitchen Psychiatry: Judgement and insight appear normal. Mood & affect appropriate.   Data Reviewed:   CBC:  Recent Labs Lab 02/05/16 1130  02/06/16 0529 02/07/16 0813 02/09/16 0627 02/10/16 0632 02/11/16 0619  WBC 13.1* 13.9* 14.0* 14.8* 5.7 9.6  NEUTROABS 7.6 8.8*  --   --   --   --   HGB 6.8* 5.5* 8.5* 7.2* 7.6* 6.6*  HCT 17.8* 14.3* 22.7* 19.9* 21.4* 19.1*  MCV 93.2 92.3 86.3 90.0 90.7 91.4  PLT PLATELET CLUMPS NOTED ON SMEAR, COUNT APPEARS ADEQUATE SPECIMEN CHECKED FOR CLOTS 120* 108* 84* 92*   Basic Metabolic Panel:  Recent Labs Lab 02/05/16 1130 02/06/16 0529 02/08/16 0600 02/09/16 0627 02/10/16 0632  NA 133* 137 139 139 139    K 4.5 3.4* 3.4* 3.1* 3.1*  CL 106 111 113* 112* 110  CO2 22 20* 22 22 23   GLUCOSE 100* 96 92 90 90  BUN 9 11 18 12 13   CREATININE <0.30* 0.32* 0.82 0.72 0.81  CALCIUM 8.2* 7.9* 8.1* 8.3* 8.2*   Liver Function Tests:  Recent Labs Lab 02/05/16 1130 02/06/16 0529 02/10/16 0632  AST 180* 145* 76*  ALT 39 34 29  ALKPHOS 131* 114 89  BILITOT 15.5* 17.1* 13.8*  PROT 7.5 6.9 7.2  ALBUMIN 2.9* 2.6* 2.4*   Coagulation Profile:  Recent Labs Lab 02/06/16 0529  INR 1.49   Cardiac Enzymes:  Recent Labs Lab 02/06/16 0529 02/06/16 1136 02/07/16 0047 02/07/16 2333  TROPONINI 1.14* 0.96* 0.38* 0.23*    No results for input(s): VITAMINB12, FOLATE, FERRITIN, TIBC, IRON, RETICCTPCT in the last 72 hours. Urine analysis:    Component Value Date/Time   COLORURINE BROWN* 02/05/2016 1323   APPEARANCEUR HAZY* 02/05/2016 1323   LABSPEC 1.020 02/05/2016 1323   PHURINE 6.0 02/05/2016 1323   GLUCOSEU NEGATIVE 02/05/2016 1323   HGBUR LARGE* 02/05/2016 1323   BILIRUBINUR LARGE* 02/05/2016 1323   KETONESUR NEGATIVE 02/05/2016 1323   PROTEINUR >300* 02/05/2016 1323   UROBILINOGEN 1.0 09/05/2015 0917   NITRITE POSITIVE* 02/05/2016 1323   LEUKOCYTESUR NEGATIVE 02/05/2016 1323   Sepsis Labs: @LABRCNTIP (procalcitonin:4,lacticidven:4)  ) Recent Results (from the past 240 hour(s))  Urine culture     Status: Abnormal   Collection Time: 02/05/16  1:24 PM  Result Value Ref Range Status   Specimen Description URINE, CLEAN CATCH  Final   Special Requests IMMUNE:COMPROMISED  Final   Culture (A)  Final    <10,000 COLONIES/mL INSIGNIFICANT GROWTH Performed at [facility]    Report Status 02/07/2016 FINAL  Final  Blood culture (routine x 2)     Status: None   Collection Time: 02/05/16  4:05 PM  Result Value Ref Range Status   Specimen Description BLOOD LEFT HAND  Final   Special Requests BOTTLES DRAWN AEROBIC AND ANAEROBIC [facility] EACH  Final   Culture NO GROWTH 5 DAYS  Final    Report Status 02/10/2016 FINAL  Final  Blood culture (routine x 2)     Status: None   Collection Time: 02/05/16  4:11 PM  Result Value Ref Range Status   Specimen Description BLOOD RIGHT HAND  Final   Special Requests BOTTLES DRAWN AEROBIC ONLY 4CC  Final   Culture NO GROWTH 5 DAYS  Final   Report Status 02/10/2016 FINAL  Final  Culture, blood (x 2)     Status: None   Collection Time: 02/06/16  5:29 AM  Result Value Ref Range Status   Specimen Description BLOOD LEFT WRIST  Final   Special Requests BOTTLES DRAWN AEROBIC ONLY [place]  Final   Culture NO GROWTH 5 DAYS  Final   Report Status 02/11/2016 FINAL  Final         Radiology Studies: No results found.      Scheduled Meds: . sodium chloride   Intravenous Once  . aspirin  325 mg Oral Daily  . folic acid  1 mg Oral Daily  . HYDROmorphone   Intravenous Q4H  . hydroxyurea  1,000 mg Oral Daily  . loratadine  10 mg Oral Daily  . morphine  15 mg Oral Q12H  . sodium chloride flush  3 mL Intravenous Q12H  . [START ON 02/12/2016] Vitamin D (Ergocalciferol)  50,000 Units Oral Q7 days   Continuous Infusions:     LOS: 5 days    Time spent: 15 mins  Kathie Dike, MD [facility] Pager [phone]  If 7PM-7AM, please contact night-coverage [email or web address] Password [email or web address] 02/11/2016, 8:18 AM    By signing my name below, I, Rennis Harding, attest that this documentation has been prepared under the direction and in the presence of Kathie Dike, MD. Electronically signed: Rennis Harding, Scribe. 02/11/2016 1:53pm    I, Dr. Kathie Dike, personally performed the services described in this documentaiton. All medical record entries made by the scribe were at my direction and in my presence. I have reviewed the chart and agree that the record reflects my personal performance and is accurate and complete  Kathie Dike, MD, 02/11/2016 2:05 PM

## 2016-02-12 LAB — COMPREHENSIVE METABOLIC PANEL
ALK PHOS: 87 U/L (ref 38–126)
ALT: 27 U/L (ref 14–54)
ANION GAP: 6 (ref 5–15)
AST: 80 U/L — ABNORMAL HIGH (ref 15–41)
Albumin: 2.3 g/dL — ABNORMAL LOW (ref 3.5–5.0)
BUN: 10 mg/dL (ref 6–20)
CALCIUM: 8.3 mg/dL — AB (ref 8.9–10.3)
CO2: 23 mmol/L (ref 22–32)
Chloride: 111 mmol/L (ref 101–111)
Creatinine, Ser: 0.71 mg/dL (ref 0.44–1.00)
Glucose, Bld: 74 mg/dL (ref 65–99)
Potassium: 3.5 mmol/L (ref 3.5–5.1)
SODIUM: 140 mmol/L (ref 135–145)
TOTAL PROTEIN: 6.8 g/dL (ref 6.5–8.1)
Total Bilirubin: 10.7 mg/dL — ABNORMAL HIGH (ref 0.3–1.2)

## 2016-02-12 LAB — CBC
HCT: 19.9 % — ABNORMAL LOW (ref 36.0–46.0)
HEMOGLOBIN: 7 g/dL — AB (ref 12.0–15.0)
MCH: 32.4 pg (ref 26.0–34.0)
MCHC: 35.2 g/dL (ref 30.0–36.0)
MCV: 92.1 fL (ref 78.0–100.0)
Platelets: 106 10*3/uL — ABNORMAL LOW (ref 150–400)
RBC: 2.16 MIL/uL — ABNORMAL LOW (ref 3.87–5.11)
RDW: 28.6 % — ABNORMAL HIGH (ref 11.5–15.5)
WBC: 8.9 10*3/uL (ref 4.0–10.5)

## 2016-02-12 NOTE — Progress Notes (Signed)
PROGRESS NOTE    Dawn [name]  [id number] DOB: 03-07-1981 DOA: 02/05/2016 PCP: Angelica Chessman, MD   Brief Narrative: 61 yof with history of sickle cell disease has been admitted for sickle cell crisis, sepsis, UTI and elevated troponin. On admission, started on empiric abx with improvement in symptoms and troponin have started to trend down.   Assessment & Plan: Active Problems:   Fever  1. Sepsis. Resolved. She was started on sepsis protocol with resolution of tachycardia, fever, and elevated lactic acid. Initially started on IVabx. Since she has been afebrile and hemodynamically stable for several days now with no positive cultures, abx has been discontinued.    2. Elevated troponin. Likely demand ischemia. Continues to trend down. No further inpatient workup.   3. Sickle cell anemia, with crisis. Transfused 2 units PRBC 5/15. Yesterday Hgb has been stable. No evidence of bleeding. Pain is well-controlled with MC contin and Dilaudid PCA. Will likely discontinue PCA in the am and transition to oral pain medications. . Continue current treatments.  4. Hypokalemia. Replaced.    DVT prophylaxis: SCDs Code Status: Full Family Communication: No family bedside Disposition Plan: Anticipate discharge home once improved.    Consultants:   Cardiology     Procedures:  Transfused 2 units PRBC 5/15.   ECHO: Study Conclusions  - Left ventricle: The cavity size was normal. Wall thickness was  increased in a pattern of mild LVH. Systolic function was  vigorous. The estimated ejection fraction was in the range of 65%  to 70%. Diastolic function is inderminate, cannot assess in  presence of significant tachycardia. Wall motion was normal;  there were no regional wall motion abnormalities. - Aortic valve: Valve area (VTI): 2.19 cm^2. Valve area (Vmax):  2.01 cm^2. - Left atrium: The atrium was severely dilated. - Right atrium: The atrium was moderately dilated. - Tricuspid  valve: There was moderate regurgitation. - Pulmonary arteries: Systolic pressure was moderately to severely  increased. PA peak pressure: 49 mm Hg (S). - Systemic veins: IVC is small, suggesting low RA pressure and  hypovolemia. - Technically adequate study.  Antimicrobials:   Zosyn 5/15>>5/18   Subjective: Feeling better today. Able to ambulate around the room without issues.   Objective: Filed Vitals:   02/11/16 2319 02/12/16 0200 02/12/16 0409 02/12/16 0600  BP:  141/63  140/71  Pulse:  85  88  Temp:  98.9 F (37.2 C)  98.7 F (37.1 C)  TempSrc:  Oral  Oral  Resp: 11 18 19 16   Height:      Weight:    82.6 kg (182 lb 1.6 oz)  SpO2: 100% 100% 100% 100%    Intake/Output Summary (Last 24 hours) at 02/12/16 0821 Last data filed at 02/12/16 [id number]  Gross per 24 hour  Intake    480 ml  Output   2451 ml  Net  -1971 ml   Filed Weights   02/10/16 0525 02/11/16 0643 02/12/16 0600  Weight: 81.285 kg (179 lb 3.2 oz) 80.922 kg (178 lb 6.4 oz) 82.6 kg (182 lb 1.6 oz)    Examination: General exam:NAD  Respiratory system: Clear to auscultation. Respiratory effort normal. Able to speak in full sentences.  Cardiovascular system: RRR. No JVD, murmurs, rubs, gallops or clicks. No pedal edema.Marland Kitchen Psychiatry: Judgement and insight appear normal. Mood & affect appropriate.   Data Reviewed:   CBC:  Recent Labs Lab 02/05/16 1130 02/06/16 0529  02/09/16 0627 02/10/16 [id number] 02/11/16 0619 02/11/16 1651 02/12/16 0627  WBC 13.1*  13.9*  < > 14.8* 5.7 9.6 9.7 8.9  NEUTROABS 7.6 8.8*  --   --   --   --   --   --   HGB 6.8* 5.5*  < > 7.2* 7.6* 6.6* 6.7* 7.0*  HCT 17.8* 14.3*  < > 19.9* 21.4* 19.1* 19.6* 19.9*  MCV 93.2 92.3  < > 90.0 90.7 91.4 92.0 92.1  PLT PLATELET CLUMPS NOTED ON SMEAR, COUNT APPEARS ADEQUATE SPECIMEN CHECKED FOR CLOTS  < > 108* 84* 92* 30* 106*  < > = values in this interval not displayed. Basic Metabolic Panel:  Recent Labs Lab 02/08/16 0600 02/09/16 0627  02/10/16 0632 02/11/16 0619 02/12/16 0627  NA 139 139 139 141 140  K 3.4* 3.1* 3.1* 3.7 3.5  CL 113* 112* 110 113* 111  CO2 22 22 23 24 23   GLUCOSE 92 90 90 91 74  BUN 18 12 13 13 10   CREATININE 0.82 0.72 0.81 0.90 0.71  CALCIUM 8.1* 8.3* 8.2* 8.2* 8.3*   Liver Function Tests:  Recent Labs Lab 02/05/16 1130 02/06/16 0529 02/10/16 0632 02/11/16 0619 02/12/16 0627  AST 180* 145* 76* 67* 80*  ALT 39 34 29 28 27   ALKPHOS 131* 114 89 101 87  BILITOT 15.5* 17.1* 13.8* 11.2* 10.7*  PROT 7.5 6.9 7.2 6.7 6.8  ALBUMIN 2.9* 2.6* 2.4* 2.2* 2.3*   Coagulation Profile:  Recent Labs Lab 02/06/16 0529  INR 1.49   Cardiac Enzymes:  Recent Labs Lab 02/06/16 0529 02/06/16 1136 02/07/16 0047 02/07/16 2333  TROPONINI 1.14* 0.96* 0.38* 0.23*    No results for input(s): VITAMINB12, FOLATE, FERRITIN, TIBC, IRON, RETICCTPCT in the last 72 hours. Urine analysis:    Component Value Date/Time   COLORURINE BROWN* 02/05/2016 1323   APPEARANCEUR HAZY* 02/05/2016 1323   LABSPEC 1.020 02/05/2016 1323   PHURINE 6.0 02/05/2016 1323   GLUCOSEU NEGATIVE 02/05/2016 1323   HGBUR LARGE* 02/05/2016 1323   BILIRUBINUR LARGE* 02/05/2016 1323   KETONESUR NEGATIVE 02/05/2016 1323   PROTEINUR >300* 02/05/2016 1323   UROBILINOGEN 1.0 09/05/2015 0917   NITRITE POSITIVE* 02/05/2016 1323   LEUKOCYTESUR NEGATIVE 02/05/2016 1323   Sepsis Labs: @LABRCNTIP (procalcitonin:4,lacticidven:4)  ) Recent Results (from the past 240 hour(s))  Urine culture     Status: Abnormal   Collection Time: 02/05/16  1:24 PM  Result Value Ref Range Status   Specimen Description URINE, CLEAN CATCH  Final   Special Requests IMMUNE:COMPROMISED  Final   Culture (A)  Final    <10,000 COLONIES/mL INSIGNIFICANT GROWTH Performed at [facility]    Report Status 02/07/2016 FINAL  Final  Blood culture (routine x 2)     Status: None   Collection Time: 02/05/16  4:05 PM  Result Value Ref Range Status   Specimen  Description BLOOD LEFT HAND  Final   Special Requests BOTTLES DRAWN AEROBIC AND ANAEROBIC [facility] EACH  Final   Culture NO GROWTH 5 DAYS  Final   Report Status 02/10/2016 FINAL  Final  Blood culture (routine x 2)     Status: None   Collection Time: 02/05/16  4:11 PM  Result Value Ref Range Status   Specimen Description BLOOD RIGHT HAND  Final   Special Requests BOTTLES DRAWN AEROBIC ONLY 4CC  Final   Culture NO GROWTH 5 DAYS  Final   Report Status 02/10/2016 FINAL  Final  Culture, blood (x 2)     Status: None   Collection Time: 02/06/16  5:29 AM  Result Value Ref Range  Status   Specimen Description BLOOD LEFT WRIST  Final   Special Requests BOTTLES DRAWN AEROBIC ONLY [place]  Final   Culture NO GROWTH 5 DAYS  Final   Report Status 02/11/2016 FINAL  Final         Radiology Studies: No results found.      Scheduled Meds: . sodium chloride   Intravenous Once  . aspirin  325 mg Oral Daily  . folic acid  1 mg Oral Daily  . HYDROmorphone   Intravenous Q4H  . hydroxyurea  1,000 mg Oral Daily  . loratadine  10 mg Oral Daily  . morphine  15 mg Oral Q12H  . sodium chloride flush  3 mL Intravenous Q12H  . Vitamin D (Ergocalciferol)  50,000 Units Oral Q7 days   Continuous Infusions:     LOS: 6 days    Time spent: 15 mins  Kathie Dike, MD [facility] Pager [phone]  If 7PM-7AM, please contact night-coverage [email or web address] Password [email or web address] 02/12/2016, 8:21 AM    By signing my name below, I, Rennis Harding, attest that this documentation has been prepared under the direction and in the presence of Kathie Dike, MD. Electronically signed: Rennis Harding, Scribe. 02/12/2016 1:53pm    I, Dr. Kathie Dike, personally performed the services described in this documentaiton. All medical record entries made by the scribe were at my direction and in my presence. I have reviewed the chart and agree that the record reflects my personal performance and is accurate and  complete  Kathie Dike, MD, 02/12/2016 2:02 PM

## 2016-02-13 DIAGNOSIS — A419 Sepsis, unspecified organism: Secondary | ICD-10-CM | POA: Diagnosis present

## 2016-02-13 DIAGNOSIS — D57219 Sickle-cell/Hb-C disease with crisis, unspecified: Secondary | ICD-10-CM | POA: Diagnosis present

## 2016-02-13 DIAGNOSIS — D649 Anemia, unspecified: Secondary | ICD-10-CM | POA: Diagnosis present

## 2016-02-13 DIAGNOSIS — I248 Other forms of acute ischemic heart disease: Secondary | ICD-10-CM | POA: Diagnosis present

## 2016-02-13 LAB — CBC
HCT: 19.3 % — ABNORMAL LOW (ref 36.0–46.0)
HEMOGLOBIN: 6.8 g/dL — AB (ref 12.0–15.0)
MCH: 32.4 pg (ref 26.0–34.0)
MCHC: 35.2 g/dL (ref 30.0–36.0)
MCV: 91.9 fL (ref 78.0–100.0)
PLATELETS: 139 10*3/uL — AB (ref 150–400)
RBC: 2.1 MIL/uL — AB (ref 3.87–5.11)
RDW: 29.2 % — ABNORMAL HIGH (ref 11.5–15.5)
WBC: 9.6 10*3/uL (ref 4.0–10.5)

## 2016-02-13 MED ORDER — OXYCODONE HCL 5 MG PO TABS
10.0000 mg | ORAL_TABLET | ORAL | Status: DC | PRN
  Administered 2016-02-14: 10 mg via ORAL
  Filled 2016-02-13 (×2): qty 2

## 2016-02-13 MED ORDER — HYDROMORPHONE HCL 1 MG/ML IJ SOLN
1.0000 mg | INTRAMUSCULAR | Status: DC | PRN
  Administered 2016-02-13 – 2016-02-15 (×9): 1 mg via INTRAVENOUS
  Filled 2016-02-13 (×9): qty 1

## 2016-02-13 NOTE — Progress Notes (Signed)
PROGRESS NOTE    Dawn [name]  [id number] DOB: August 23, 1981 DOA: 02/05/2016 PCP: Angelica Chessman, MD   Brief Narrative: 47 yof with history of sickle cell disease has been admitted for sickle cell crisis, sepsis, UTI and elevated troponin. On admission, started on empiric abx with improvement in symptoms and troponin have started to trend down.   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 Sepsis. Resolved. She was started on sepsis protocol with resolution of tachycardia, fever, and elevated lactic acid. Initially started on IV abx. Source of sepsis was not clear. Cultures have been unrevealing. Since she has been afebrile and hemodynamically stable for several days now with no positive cultures, abx were discontinued.    2. Elevated troponin. Likely demand ischemia in the setting of severe anemia and sepsis. Continues to trend down. No further inpatient workup.   3. Sickle cell anemia, with crisis. Transfused 2 units PRBC 5/15. Hgb remains low, but relatively stable. No evidence of bleeding. Pain is well-controlled with MC contin and Dilaudid PCA. Will discontinue PCA this am and transition to oral pain medications. Continue current treatments.  4. Hypokalemia. Replaced.  5. Thrombocytopenia. Likely related to anemia. Improving. 6. 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 Continue long acting pain medications 7. Hyperbilirubinemia. Related to hemolysis in sickle cell. Trending down.   DVT prophylaxis: SCDs Code Status: Full Family Communication: No family bedside Disposition Plan: Anticipate discharge home once improved.    Consultants:   Cardiology     Procedures:  Transfused 2 units PRBC 5/15.   ECHO: Study Conclusions  - Left ventricle: The cavity size was normal. Wall thickness was  increased in a  pattern of mild LVH. Systolic function was  vigorous. The estimated ejection fraction was in the range of 65%  to 70%. Diastolic function is inderminate, cannot assess in  presence of significant tachycardia. Wall motion was normal;  there were no regional wall motion abnormalities. - Aortic valve: Valve area (VTI): 2.19 cm^2. Valve area (Vmax):  2.01 cm^2. - Left atrium: The atrium was severely dilated. - Right atrium: The atrium was moderately dilated. - Tricuspid valve: There was moderate regurgitation. - Pulmonary arteries: Systolic pressure was moderately to severely  increased. PA peak pressure: 49 mm Hg (S). - Systemic veins: IVC is small, suggesting low RA pressure and  hypovolemia. - Technically adequate study.  Antimicrobials:   Zosyn 5/15>>5/18   Subjective: Pain is better controlled today. No shortness of breath. She is ambulating.  Objective: Filed Vitals:   02/13/16 0700 02/13/16 1014 02/13/16 1200 02/13/16 1440  BP: 154/89   138/80  Pulse: 87   84  Temp: 98.8 F (37.1 C)   98.6 F (37 C)  TempSrc: Oral   Oral  Resp: 11 20 20 18   Height:      Weight: 80.151 kg (176 lb 11.2 oz)     SpO2: 100% 100% 100% 95%    Intake/Output Summary (Last 24 hours) at 02/13/16 1702 Last data filed at 02/13/16 1300  Gross per 24 hour  Intake    600 ml  Output      0 ml  Net    600 ml   Filed Weights   02/11/16 0643 02/12/16 0600 02/13/16 0700  Weight: 80.922 kg (178 lb 6.4 oz) 82.6 kg (182 lb  1.6 oz) 80.151 kg (176 lb 11.2 oz)    Examination:  General exam: Appears calm and comfortable  Respiratory system: Clear to auscultation. Respiratory effort normal. Cardiovascular system: S1 & S2 heard, RRR. No JVD, murmurs, rubs, gallops or clicks. No pedal edema. Gastrointestinal system: Abdomen is nondistended, soft and nontender. No organomegaly or masses felt. Normal bowel sounds heard. Central nervous system: Alert and oriented. No focal neurological  deficits. Extremities: Symmetric 5 x 5 power. Skin: No rashes, lesions or ulcers Psychiatry: Judgement and insight appear normal. Mood & affect appropriate.    Data Reviewed:   CBC:  Recent Labs Lab 02/10/16 0632 02/11/16 0619 02/11/16 1651 02/12/16 0627 02/13/16 0501  WBC 5.7 9.6 9.7 8.9 9.6  HGB 7.6* 6.6* 6.7* 7.0* 6.8*  HCT 21.4* 19.1* 19.6* 19.9* 19.3*  MCV 90.7 91.4 92.0 92.1 91.9  PLT 84* 92* 30* 106* [id number]*   Basic Metabolic Panel:  Recent Labs Lab 02/08/16 0600 02/09/16 0627 02/10/16 0632 02/11/16 0619 02/12/16 0627  NA 139 139 139 141 140  K 3.4* 3.1* 3.1* 3.7 3.5  CL 113* 112* 110 113* 111  CO2 22 22 23 24 23   GLUCOSE 92 90 90 91 74  BUN 18 12 13 13 10   CREATININE 0.82 0.72 0.81 0.90 0.71  CALCIUM 8.1* 8.3* 8.2* 8.2* 8.3*   Liver Function Tests:  Recent Labs Lab 02/10/16 0632 02/11/16 0619 02/12/16 0627  AST 76* 67* 80*  ALT 29 28 27   ALKPHOS 89 101 87  BILITOT 13.8* 11.2* 10.7*  PROT 7.2 6.7 6.8  ALBUMIN 2.4* 2.2* 2.3*   Coagulation Profile: No results for input(s): INR, PROTIME in the last 168 hours. Cardiac Enzymes:  Recent Labs Lab 02/07/16 0047 02/07/16 2333  TROPONINI 0.38* 0.23*    No results for input(s): VITAMINB12, FOLATE, FERRITIN, TIBC, IRON, RETICCTPCT in the last 72 hours. Urine analysis:    Component Value Date/Time   COLORURINE BROWN* 02/05/2016 1323   APPEARANCEUR HAZY* 02/05/2016 1323   LABSPEC 1.020 02/05/2016 1323   PHURINE 6.0 02/05/2016 1323   GLUCOSEU NEGATIVE 02/05/2016 1323   HGBUR LARGE* 02/05/2016 1323   BILIRUBINUR LARGE* 02/05/2016 1323   KETONESUR NEGATIVE 02/05/2016 1323   PROTEINUR >300* 02/05/2016 1323   UROBILINOGEN 1.0 09/05/2015 0917   NITRITE POSITIVE* 02/05/2016 1323   LEUKOCYTESUR NEGATIVE 02/05/2016 1323   Sepsis Labs: @LABRCNTIP (procalcitonin:4,lacticidven:4)  ) Recent Results (from the past 240 hour(s))  Urine culture     Status: Abnormal   Collection Time: 02/05/16  1:24 PM   Result Value Ref Range Status   Specimen Description URINE, CLEAN CATCH  Final   Special Requests IMMUNE:COMPROMISED  Final   Culture (A)  Final    <10,000 COLONIES/mL INSIGNIFICANT GROWTH Performed at [facility]    Report Status 02/07/2016 FINAL  Final  Blood culture (routine x 2)     Status: None   Collection Time: 02/05/16  4:05 PM  Result Value Ref Range Status   Specimen Description BLOOD LEFT HAND  Final   Special Requests BOTTLES DRAWN AEROBIC AND ANAEROBIC [place]  Final   Culture NO GROWTH 5 DAYS  Final   Report Status 02/10/2016 FINAL  Final  Blood culture (routine x 2)     Status: None   Collection Time: 02/05/16  4:11 PM  Result Value Ref Range Status   Specimen Description BLOOD RIGHT HAND  Final   Special Requests BOTTLES DRAWN AEROBIC ONLY 4CC  Final   Culture NO GROWTH 5 DAYS  Final  Report Status 02/10/2016 FINAL  Final  Culture, blood (x 2)     Status: None   Collection Time: 02/06/16  5:29 AM  Result Value Ref Range Status   Specimen Description BLOOD LEFT WRIST  Final   Special Requests BOTTLES DRAWN AEROBIC ONLY [place]  Final   Culture NO GROWTH 5 DAYS  Final   Report Status 02/11/2016 FINAL  Final      Radiology Studies: No results found.    Scheduled Meds: . sodium chloride   Intravenous Once  . aspirin  325 mg Oral Daily  . folic acid  1 mg Oral Daily  . hydroxyurea  1,000 mg Oral Daily  . loratadine  10 mg Oral Daily  . morphine  15 mg Oral Q12H  . sodium chloride flush  3 mL Intravenous Q12H  . Vitamin D (Ergocalciferol)  50,000 Units Oral Q7 days   Continuous Infusions:     LOS: 7 days    Time spent: 25 mins  Kathie Dike, MD [facility] Pager [phone]  If 7PM-7AM, please contact night-coverage [email or web address] Password [email or web address] 02/13/2016, 5:02 PM

## 2016-02-14 LAB — CBC
HCT: 22.7 % — ABNORMAL LOW (ref 36.0–46.0)
Hemoglobin: 7.7 g/dL — ABNORMAL LOW (ref 12.0–15.0)
MCH: 31.6 pg (ref 26.0–34.0)
MCHC: 33.9 g/dL (ref 30.0–36.0)
MCV: 93 fL (ref 78.0–100.0)
PLATELETS: 86 10*3/uL — AB (ref 150–400)
RBC: 2.44 MIL/uL — ABNORMAL LOW (ref 3.87–5.11)
RDW: 29.8 % — AB (ref 11.5–15.5)
WBC: 8 10*3/uL (ref 4.0–10.5)

## 2016-02-14 NOTE — Progress Notes (Signed)
PROGRESS NOTE    Dawn [name]  [id number] DOB: 1980-11-17 DOA: 02/05/2016 PCP: Angelica Chessman, MD   Brief Narrative: 48 yof with history of sickle cell disease has been admitted for sickle cell crisis, sepsis and elevated troponin. Etiology of sepsis was unclear but resolved with abx. She has been afebrile and hemodyically  stable. CX have shown no growth and abx discontinued. Elevated troponin felt to be related to demand ischemia in the setting of sepsis and  Severe anemia. She was seen by cardiology and no further workup was recommended. Most challenging issue has been managing her pain related to sickle cell crisis. She briefly was on Dilaudid PCA and has since been weaned back to oral pain medications with IV pushed PRN. She currently rates her pain as a 7-8 and feels she maybe able to return home once this returns to a 4 or 5. Anticipiate discharge in the next 24 hours.   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 Sepsis. Resolved. She was started on sepsis protocol with resolution of tachycardia, fever, and elevated lactic acid. Initially started on IV abx. Source of sepsis was not clear. Cultures have been unrevealing. Since she has been afebrile and hemodynamically stable for several days now with no positive cultures, abx were discontinued.    2. Elevated troponin. Likely demand ischemia in the setting of severe anemia and sepsis. Continues to trend down. No further inpatient workup.   3. Sickle cell anemia, with crisis. Transfused 2 units PRBC 5/15. Hgb remains low, but relatively stable. No evidence of bleeding. Overall pain control is improving but not yet at goal. Continue current treatments.  4. Hypokalemia. Replaced.  5. Thrombocytopenia. Likely related to anemia.Contiue to monitor.  6. Chronic pain  syndrome due to sickle cell. Continue long acting pain medications 7. Hyperbilirubinemia. Related to hemolysis in sickle cell. Improving.    DVT prophylaxis: SCDs Code Status: Full Family Communication: No family bedside Disposition Plan: Anticipate discharge home once improved.    Consultants:   Cardiology     Procedures:  Transfused 2 units PRBC 5/15.   ECHO: Study Conclusions  - Left ventricle: The cavity size was normal. Wall thickness was  increased in a pattern of mild LVH. Systolic function was  vigorous. The estimated ejection fraction was in the range of 65%  to 70%. Diastolic function is inderminate, cannot assess in  presence of significant tachycardia. Wall motion was normal;  there were no regional wall motion abnormalities. - Aortic valve: Valve area (VTI): 2.19 cm^2. Valve area (Vmax):  2.01 cm^2. - Left atrium: The atrium was severely dilated. - Right atrium: The atrium was moderately dilated. - Tricuspid valve: There was moderate regurgitation. - Pulmonary arteries: Systolic pressure was moderately to severely  increased. PA peak pressure: 49 mm Hg (S). - Systemic veins: IVC is small, suggesting low RA pressure and  hypovolemia. - Technically adequate study.  Antimicrobials:   Zosyn 5/15>>5/18   Subjective: Pain has returned to 7/10. She is able to ambulate and notices her weight going down.   Objective: Filed Vitals:   02/13/16 1440 02/13/16 2133 02/14/16 0600 02/14/16 1413  BP: 138/80 149/79 138/79 134/74  Pulse: 84 101 99 96  Temp: 98.6 F (37 C) 99.7 F (37.6 C) 99.1 F (37.3 C)   TempSrc: Oral Oral  Oral   Resp: 18 20 20 18   Height:      Weight:   77.157 kg (170 lb 1.6 oz)   SpO2: 95% 100% 100% 100%    Intake/Output Summary (Last 24 hours) at 02/14/16 1447 Last data filed at 02/14/16 0926  Gross per 24 hour  Intake    123 ml  Output      0 ml  Net    123 ml   Filed Weights   02/12/16 0600 02/13/16 0700 02/14/16 0600    Weight: 82.6 kg (182 lb 1.6 oz) 80.151 kg (176 lb 11.2 oz) 77.157 kg (170 lb 1.6 oz)    Examination:  General exam: NAD   Respiratory system: Clear to auscultation. Respiratory effort normal. Cardiovascular system: S1 & S2 heard, RRR. No JVD, murmurs, rubs, gallops or clicks. No pedal edema. Gastrointestinal system: Abdomen is nondistended, soft and nontender. No organomegaly or masses felt. Normal bowel sounds heard. Psychiatry: Judgement and insight appear normal. Mood & affect appropriate.    Data Reviewed:   CBC:  Recent Labs Lab 02/11/16 0619 02/11/16 1651 02/12/16 0627 02/13/16 0501 02/14/16 0733  WBC 9.6 9.7 8.9 9.6 8.0  HGB 6.6* 6.7* 7.0* 6.8* 7.7*  HCT 19.1* 19.6* 19.9* 19.3* 22.7*  MCV 91.4 92.0 92.1 91.9 93.0  PLT 92* 30* 106* 139* 86*   Basic Metabolic Panel:  Recent Labs Lab 02/08/16 0600 02/09/16 0627 02/10/16 0632 02/11/16 0619 02/12/16 0627  NA 139 139 139 141 140  K 3.4* 3.1* 3.1* 3.7 3.5  CL 113* 112* 110 113* 111  CO2 22 22 23 24 23   GLUCOSE 92 90 90 91 74  BUN 18 12 13 13 10   CREATININE 0.82 0.72 0.81 0.90 0.71  CALCIUM 8.1* 8.3* 8.2* 8.2* 8.3*   Liver Function Tests:  Recent Labs Lab 02/10/16 0632 02/11/16 0619 02/12/16 0627  AST 76* 67* 80*  ALT 29 28 27   ALKPHOS 89 101 87  BILITOT 13.8* 11.2* 10.7*  PROT 7.2 6.7 6.8  ALBUMIN 2.4* 2.2* 2.3*   Coagulation Profile: No results for input(s): INR, PROTIME in the last 168 hours. Cardiac Enzymes:  Recent Labs Lab 02/07/16 2333  TROPONINI 0.23*    No results for input(s): VITAMINB12, FOLATE, FERRITIN, TIBC, IRON, RETICCTPCT in the last 72 hours. Urine analysis:    Component Value Date/Time   COLORURINE BROWN* 02/05/2016 1323   APPEARANCEUR HAZY* 02/05/2016 1323   LABSPEC 1.020 02/05/2016 1323   PHURINE 6.0 02/05/2016 1323   GLUCOSEU NEGATIVE 02/05/2016 1323   HGBUR LARGE* 02/05/2016 1323   BILIRUBINUR LARGE* 02/05/2016 1323   KETONESUR NEGATIVE 02/05/2016 1323    PROTEINUR >300* 02/05/2016 1323   UROBILINOGEN 1.0 09/05/2015 0917   NITRITE POSITIVE* 02/05/2016 1323   LEUKOCYTESUR NEGATIVE 02/05/2016 1323   Sepsis Labs: @LABRCNTIP (procalcitonin:4,lacticidven:4)  ) Recent Results (from the past 240 hour(s))  Urine culture     Status: Abnormal   Collection Time: 02/05/16  1:24 PM  Result Value Ref Range Status   Specimen Description URINE, CLEAN CATCH  Final   Special Requests IMMUNE:COMPROMISED  Final   Culture (A)  Final    <10,000 COLONIES/mL INSIGNIFICANT GROWTH Performed at [facility]    Report Status 02/07/2016 FINAL  Final  Blood culture (routine x 2)     Status: None   Collection Time: 02/05/16  4:05 PM  Result Value Ref Range Status   Specimen Description BLOOD LEFT HAND  Final   Special Requests BOTTLES DRAWN AEROBIC AND ANAEROBIC [place]  Final   Culture NO GROWTH 5 DAYS  Final   Report Status 02/10/2016 FINAL  Final  Blood culture (routine x 2)     Status: None   Collection Time: 02/05/16  4:11 PM  Result Value Ref Range Status   Specimen Description BLOOD RIGHT HAND  Final   Special Requests BOTTLES DRAWN AEROBIC ONLY 4CC  Final   Culture NO GROWTH 5 DAYS  Final   Report Status 02/10/2016 FINAL  Final  Culture, blood (x 2)     Status: None   Collection Time: 02/06/16  5:29 AM  Result Value Ref Range Status   Specimen Description BLOOD LEFT WRIST  Final   Special Requests BOTTLES DRAWN AEROBIC ONLY [place]  Final   Culture NO GROWTH 5 DAYS  Final   Report Status 02/11/2016 FINAL  Final      Radiology Studies: No results found.    Scheduled Meds: . sodium chloride   Intravenous Once  . aspirin  325 mg Oral Daily  . folic acid  1 mg Oral Daily  . hydroxyurea  1,000 mg Oral Daily  . loratadine  10 mg Oral Daily  . morphine  15 mg Oral Q12H  . sodium chloride flush  3 mL Intravenous Q12H  . Vitamin D (Ergocalciferol)  50,000 Units Oral Q7 days   Continuous Infusions:     LOS: 8 days    Time spent: 25  mins  Kathie Dike, MD [facility] Pager [phone]  If 7PM-7AM, please contact night-coverage [email or web address] Password [facility] 02/14/2016, 2:47 PM    By signing my name below, I, Rennis Harding, attest that this documentation has been prepared under the direction and in the presence of Kathie Dike, MD. Electronically signed: Rennis Harding, Scribe. 02/14/2016 2:46PM   I, Dr. Kathie Dike, personally performed the services described in this documentaiton. All medical record entries made by the scribe were at my direction and in my presence. I have reviewed the chart and agree that the record reflects my personal performance and is accurate and complete  Kathie Dike, MD, 02/14/2016 2:56 PM

## 2016-02-14 NOTE — Care Management Note (Signed)
Case Management Note  Patient Details  Name: Dawn [name] MRN: [id number] Date of Birth: Apr 18, 1981  Expected Discharge Date:  02/07/16               Expected Discharge Plan:  Home/Self Care  In-House Referral:  NA  Discharge planning Services  CM Consult  Post Acute Care Choice:  NA Choice offered to:  NA  DME Arranged:    DME Agency:     HH Arranged:    [place] Agency:     Status of Service:  Completed, signed off  Medicare Important Message Given:    Date Medicare IM Given:    Medicare IM give by:    Date Additional Medicare IM Given:    Additional Medicare Important Message give by:     If discussed at [place] of Stay Meetings, dates discussed:  02/14/2016  Additional Comments:  Sherald Barge, RN 02/14/2016, 2:58 PM

## 2016-02-15 DIAGNOSIS — D696 Thrombocytopenia, unspecified: Secondary | ICD-10-CM

## 2016-02-15 DIAGNOSIS — D5700 Hb-SS disease with crisis, unspecified: Secondary | ICD-10-CM

## 2016-02-15 DIAGNOSIS — G894 Chronic pain syndrome: Secondary | ICD-10-CM

## 2016-02-15 DIAGNOSIS — R509 Fever, unspecified: Secondary | ICD-10-CM | POA: Diagnosis present

## 2016-02-15 MED ORDER — OXYCODONE HCL 10 MG PO TABS: 10.0000 mg | ORAL_TABLET | ORAL | Status: DC | PRN

## 2016-02-15 MED ORDER — ASPIRIN 81 MG PO TABS: 81.0000 mg | ORAL_TABLET | Freq: Every day | ORAL | Status: DC

## 2016-02-15 NOTE — Progress Notes (Signed)
Pt to discharge home.  IV and telemetry removed, tolerated well.

## 2016-02-15 NOTE — Care Management Important Message (Signed)
Important Message  Patient Details  Name: Dawn [name] MRN: [id number] Date of Birth: 06-Feb-1981   Medicare Important Message Given:  Yes    Sherald Barge, RN 02/15/2016, 2:21 PM

## 2016-02-15 NOTE — Progress Notes (Signed)
Reviewed discharge instructions with pt and answered all questions at this time.   

## 2016-02-15 NOTE — Care Management Note (Signed)
Case Management Note  Patient Details  Name: MADELLE PANZER MRN: [id number] Date of Birth: January 10, 1981  Subjective/Objective:                    Action/Plan:   Expected Discharge Date:  02/07/16               Expected Discharge Plan:  Home/Self Care  In-House Referral:  NA  Discharge planning Services  CM Consult  Post Acute Care Choice:  NA Choice offered to:  NA  DME Arranged:    DME Agency:     HH Arranged:    [place] Agency:     Status of Service:  Completed, signed off  Medicare Important Message Given:    Date Medicare IM Given:    Medicare IM give by:    Date Additional Medicare IM Given:    Additional Medicare Important Message give by:     If discussed at [place] of Stay Meetings, dates discussed:    Additional Comments: Pt discharged home with self care. No CM needs.   Sheridyn Canino, Chauncey [place], RN 02/15/2016, 1:29 PM

## 2016-02-15 NOTE — Discharge Summary (Signed)
Physician Discharge Summary  Dawn [name] [id number] DOB: 1981-03-24 DOA: 02/05/2016  PCP: Angelica Chessman, MD  Admit date: 02/05/2016 Discharge date: 02/15/2016  Time spent: Greater than 30 minutes  Recommendations for Outpatient Follow-up:  1. Recommend follow-up CBC to check the patient's platelet count and H/H. (Patient was discharged on aspirin).    Discharge Diagnoses:  1. Culture-negative sepsis. Etiology not clarified during the hospitalization. 2. Elevated troponin I secondary to demand ischemia in the setting of severe anemia and sepsis. 3. Sickle cell anemia with crisis. Status post 2 units of packed red blood cells. 4. Thrombocytopenia, chronic. Etiology likely secondary to sickle cell disease. 5. Hypokalemia. 6. 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 7. Hyperbilirubinemia secondary to hemolysis from sickle cell disease.  Discharge Condition: Improved.  Diet recommendation: Heart healthy.  Filed Weights   02/13/16 0700 02/14/16 0600 02/15/16 0500  Weight: 80.151 kg (176 lb 11.2 oz) 77.157 kg (170 lb 1.6 oz) 76.431 kg (168 lb 8 oz)    History of present illness:  Patient is a 35 year old woman with a history of sickle cell disease, who presented to the ED with a chief complaint of diffuse sickle cell musculoskeletal pain. She reported having a fever at home. In the ED, she was febrile with a temperature of 103.2, mildly tachycardic, and with a normal blood pressure. Her lab data were significant for a WBC of 13.1, hemoglobin of 6.8, hematocrit of 17.8, platelet clumping, reticulocyte count of greater than 23.0, AST of 180, bilirubin of 15.5, direct bilirubin of 5.9 and indirect bilirubin of 9.6, normal lactic acid, and troponin I of 1.14. She was admitted for further evaluation and management.  Hospital Course:  Patient was started on vigorous IV fluids. Blood cultures and urine culture were ordered. She was given IV vancomycin and Rocephin in the ED.  Antibiotic therapy was subsequently changed to Zosyn alone. IV hydromorphone via PCA was ordered as needed for pain. She was maintained on as needed oxycodone and hydroxyurea. Her urinalysis was not suggestive of a UTI. Her follow-up lactic acid level increased to above normal range. Her hemoglobin fell to 5.5. She was transfused 2 units of packed red blood cells with improvement in her hemoglobin/hematocrit. Due to her elevated troponin I, aspirin was started. Her troponin I was cycled. It trended down to 0.23. Cardiology was consulted. Dr. Bronson Ing evaluated the patient and felt that the elevated troponin I was secondary to demand ischemia in the context of marked anemia and acute sickle cell crisis. He agreed with aspirin, but did not recommend starting IV heparin. 2-D echocardiogram was ordered for evaluation and it revealed an EF of 65-70% with normal wall motion; it also revealed biatrial enlargement and pulmonary hypertension.  During the hospital course, the patient was noted to have hyperbilirubinemia and thrombocytopenia which were both chronic and thought to be secondary to sickle cell disease with crisis. There was no evidence of bleeding or specific right upper quadrant abdominal pain. Her serum potassium decreased and she was supplemented and repleted with potassium chloride.  Following treatment, her pain subsided. Her hemoglobin and hematocrit remained stable, prior to discharge, ranging from 7-7.7. All of her cultures remained essentially negative to date. Antibiotics were discontinued at the time of discharge following several days of treatment. She was discharged in improved condition. Of note, she was discharged on 81 mg aspirin daily, therefore, it is recommended that her platelet count and hemoglobin/hematocrit be followed closely in the outpatient setting.   Procedures: 1.) 2-D echocardiogram:-  Left ventricle: The cavity size was normal. Wall thickness was  increased in a pattern  of mild LVH. Systolic function was  vigorous. The estimated ejection fraction was in the range of 65%  to 70%. Diastolic function is inderminate, cannot assess in  presence of significant tachycardia. Wall motion was normal;  there were no regional wall motion abnormalities. - Aortic valve: Valve area (VTI): 2.19 cm^2. Valve area (Vmax):  2.01 cm^2. - Left atrium: The atrium was severely dilated. - Right atrium: The atrium was moderately dilated. - Tricuspid valve: There was moderate regurgitation. - Pulmonary arteries: Systolic pressure was moderately to severely  increased. PA peak pressure: 49 mm Hg (S). - Systemic veins: IVC is small, suggesting low RA pressure and  hypovolemia. - Technically adequate study.  2.) Transfusion of 2 units of packed red blood cells on 02/06/2016.  Consultations:  Cardiology  Discharge Exam: Filed Vitals:   02/14/16 2100 02/15/16 0500  BP: 139/72 139/77  Pulse: 91 88  Temp: 98.5 F (36.9 C) 98.2 F (36.8 C)  Resp: 18 18  Oxygen saturation 100% on room air.  General: Pleasant 35 year old African-American woman sitting up in bed, in no acute distress. Cardiovascular: S1, S2, with a soft systolic murmur. Respiratory: Coarse breath sounds, but otherwise clear. Breathing nonlabored.  Discharge Instructions   Discharge Instructions    Diet general    Complete by:  As directed      Discharge instructions    Complete by:  As directed   He will need your blood counts rechecked at your hospital follow-up appointment with her primary doctor.     Increase activity slowly    Complete by:  As directed           Current Discharge Medication List    START taking these medications   Details  aspirin 81 MG tablet Take 1 tablet (81 mg total) by mouth daily.      CONTINUE these medications which have CHANGED   Details  Oxycodone HCl 10 MG TABS Take 1 tablet (10 mg total) by mouth every 4 (four) hours as needed (breakthrough pain).    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 May take with food to minimize GI side effects. Qty: 60 capsule, Refills: 11   Associated Diagnoses: Hb-SS disease without crisis ([facility])    loratadine (CLARITIN) 10 MG tablet Take 10 mg by mouth daily.    morphine (MS CONTIN) 15 MG 12 hr tablet Take 1 tablet (15 mg total) by mouth every 12 (twelve) hours. Qty: 60 tablet, Refills: 0   Associated Diagnoses: Hb-SS disease without crisis ([place])    Multiple Vitamin (MULTIVITAMIN WITH MINERALS) TABS tablet Take 1 tablet by mouth daily.    Vitamin D, Ergocalciferol, (DRISDOL) 50000 UNITS CAPS capsule Take 1 capsule (50,000 Units total) by mouth every 7 (seven) days. Takes on Sundays. Qty: 4 capsule, Refills: 11   Associated Diagnoses: Vitamin D deficiency    acetaminophen (TYLENOL) 500 MG tablet Take 500 mg by mouth every 6 (six) hours as needed for mild pain.     ibuprofen (ADVIL,MOTRIN) 200 MG tablet Take 200 mg by mouth every  6 (six) hours as needed for mild pain or moderate pain.    levonorgestrel (MIRENA) 20 MCG/24HR IUD 1 each by Intrauterine route once.    polyethylene glycol (MIRALAX / GLYCOLAX) packet Take 17 g by mouth daily as needed for mild constipation. Qty: 14 each, Refills: 0    promethazine (PHENERGAN) 25 MG tablet Take 1 tablet (25 mg total) by mouth every 6 (six) hours as needed for nausea or vomiting. Qty: 20 tablet, Refills: 0      STOP taking these medications     cyclobenzaprine (FLEXERIL) 10 MG tablet      hydrOXYzine (ATARAX/VISTARIL) 10 MG tablet        Allergies  Allergen Reactions  . Desferal [Deferoxamine] Hives    Local reaction  on arm only during infusion. Can take with benadryl   . Latex Other (See Comments)    REACTION: Pt experiences a burning sensation on contacted skin areas  . Lisinopril Other (See Comments) and Cough    REACTION: Sore/scratchy throat  . Tape Other (See Comments)    REACTION: Pt. Experiences a burning sensation on contacted skin areas   Follow-up Information    Follow up with JEGEDE, OLUGBEMIGA, MD. Schedule an appointment as soon as possible for a visit in 1 week.   Specialty:  Internal Medicine   Contact information:   [place] [address] [phone]        The results of significant diagnostics from this hospitalization (including imaging, microbiology, ancillary and laboratory) are listed below for reference.    Significant Diagnostic Studies: Dg Chest 2 View  02/05/2016  CLINICAL DATA:  Fever, sickle cell disease EXAM: CHEST  2 VIEW COMPARISON:  01/12/2016 FINDINGS: Cardiomediastinal silhouette is stable. No acute infiltrate or pleural effusion. No pulmonary edema. Bony thorax is unremarkable. IMPRESSION: No active cardiopulmonary disease. Electronically Signed   By: Lahoma Crocker M.D.   On: 02/05/2016 14:51   Dg Chest Port 1 View  02/06/2016  CLINICAL DATA:  Sickle cell patient. Shortness of breath, chest pain, and fever. EXAM: PORTABLE CHEST 1 VIEW COMPARISON:  02/05/2016 FINDINGS: Mild cardiac enlargement. No vascular congestion or edema. No focal airspace disease or consolidation. Soft tissue attenuation over the left lung base. No blunting of costophrenic angles. No pneumothorax. Mediastinal contours appear intact. 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 IMPRESSION: Mild cardiac enlargement.  No evidence of active pulmonary disease. Electronically Signed   By: Lucienne Capers M.D.   On: 02/06/2016 06:33    Microbiology: Recent Results (from the past 240 hour(s))  Urine culture     Status: Abnormal   Collection Time: 02/05/16  1:24 PM   Result Value Ref Range Status   Specimen Description URINE, CLEAN CATCH  Final   Special Requests IMMUNE:COMPROMISED  Final   Culture (A)  Final    <10,000 COLONIES/mL INSIGNIFICANT GROWTH Performed at [facility]    Report Status 02/07/2016 FINAL  Final  Blood culture (routine x 2)     Status: None   Collection Time: 02/05/16  4:05 PM  Result Value Ref Range Status   Specimen Description BLOOD LEFT HAND  Final   Special Requests BOTTLES DRAWN AEROBIC AND ANAEROBIC [place]  Final   Culture NO GROWTH 5 DAYS  Final   Report Status 02/10/2016 FINAL  Final  Blood culture (routine x 2)     Status: None   Collection Time: 02/05/16  4:11 PM  Result Value Ref Range Status  Specimen Description BLOOD RIGHT HAND  Final   Special Requests BOTTLES DRAWN AEROBIC ONLY 4CC  Final   Culture NO GROWTH 5 DAYS  Final   Report Status 02/10/2016 FINAL  Final  Culture, blood (x 2)     Status: None   Collection Time: 02/06/16  5:29 AM  Result Value Ref Range Status   Specimen Description BLOOD LEFT WRIST  Final   Special Requests BOTTLES DRAWN AEROBIC ONLY 6CC  Final   Culture NO GROWTH 5 DAYS  Final   Report Status 02/11/2016 FINAL  Final     Labs: Basic Metabolic Panel:  Recent Labs Lab 02/09/16 0627 02/10/16 0632 02/11/16 0619 02/12/16 0627  NA 139 139 141 140  K 3.1* 3.1* 3.7 3.5  CL 112* 110 113* 111  CO2 22 23 24 23   GLUCOSE 90 90 91 74  BUN 12 13 13 10   CREATININE 0.72 0.81 0.90 0.71  CALCIUM 8.3* 8.2* 8.2* 8.3*   Liver Function Tests:  Recent Labs Lab 02/10/16 0632 02/11/16 0619 02/12/16 0627  AST 76* 67* 80*  ALT 29 28 27   ALKPHOS 89 101 87  BILITOT 13.8* 11.2* 10.7*  PROT 7.2 6.7 6.8  ALBUMIN 2.4* 2.2* 2.3*   No results for input(s): LIPASE, AMYLASE in the last 168 hours. No results for input(s): AMMONIA in the last 168 hours. CBC:  Recent Labs Lab 02/11/16 0619 02/11/16 1651 02/12/16 0627 02/13/16 0501 02/14/16 0733  WBC 9.6 9.7 8.9 9.6 8.0   HGB 6.6* 6.7* 7.0* 6.8* 7.7*  HCT 19.1* 19.6* 19.9* 19.3* 22.7*  MCV 91.4 92.0 92.1 91.9 93.0  PLT 92* 30* 106* 139* 86*   Cardiac Enzymes: No results for input(s): CKTOTAL, CKMB, CKMBINDEX, TROPONINI in the last 168 hours. BNP: BNP (last 3 results) No results for input(s): BNP in the last 8760 hours.  ProBNP (last 3 results) No results for input(s): PROBNP in the last 8760 hours.  CBG: No results for input(s): GLUCAP in the last 168 hours.     Signed:  Jamelia Varano MD.  [facility] 02/15/2016, 11:15 AM

## 2016-02-16 ENCOUNTER — Encounter (HOSPITAL_COMMUNITY): Payer: Self-pay | Admitting: Internal Medicine

## 2016-02-16 DIAGNOSIS — I517 Cardiomegaly: Secondary | ICD-10-CM | POA: Diagnosis present

## 2016-02-16 DIAGNOSIS — I272 Pulmonary hypertension, unspecified: Secondary | ICD-10-CM | POA: Diagnosis present

## 2016-02-27 ENCOUNTER — Telehealth (HOSPITAL_COMMUNITY): Payer: Self-pay | Admitting: *Deleted

## 2016-02-27 NOTE — Telephone Encounter (Signed)
Expand All Collapse All   Called patient to extend an invitation for a Sickle Cell Education Luncheon on June 21 at 1pm. Left message and asked for a return call for confirmation.

## 2016-03-01 ENCOUNTER — Encounter: Payer: Self-pay | Admitting: Family Medicine

## 2016-03-01 ENCOUNTER — Ambulatory Visit (INDEPENDENT_AMBULATORY_CARE_PROVIDER_SITE_OTHER): Payer: BLUE CROSS/BLUE SHIELD | Admitting: Family Medicine

## 2016-03-01 VITALS — BP 130/79 | HR 87 | Temp 98.5°F | Resp 16 | Ht 63.5 in | Wt 160.0 lb

## 2016-03-01 DIAGNOSIS — E559 Vitamin D deficiency, unspecified: Secondary | ICD-10-CM

## 2016-03-01 DIAGNOSIS — D696 Thrombocytopenia, unspecified: Secondary | ICD-10-CM | POA: Diagnosis not present

## 2016-03-01 DIAGNOSIS — D571 Sickle-cell disease without crisis: Secondary | ICD-10-CM

## 2016-03-01 LAB — CBC WITH DIFFERENTIAL/PLATELET
BASOS ABS: 50 {cells}/uL (ref 0–200)
Basophils Relative: 1 %
Eosinophils Absolute: 100 cells/uL (ref 15–500)
Eosinophils Relative: 2 %
HEMATOCRIT: 24.2 % — AB (ref 35.0–45.0)
HEMOGLOBIN: 8 g/dL — AB (ref 11.7–15.5)
LYMPHS ABS: 1800 {cells}/uL (ref 850–3900)
Lymphocytes Relative: 36 %
MCH: 32.5 pg (ref 27.0–33.0)
MCHC: 33.1 g/dL (ref 32.0–36.0)
MCV: 98.4 fL (ref 80.0–100.0)
MONO ABS: 1150 {cells}/uL — AB (ref 200–950)
MPV: 9.7 fL (ref 7.5–12.5)
Monocytes Relative: 23 %
NEUTROS PCT: 38 %
Neutro Abs: 1900 cells/uL (ref 1500–7800)
Platelets: 336 10*3/uL (ref 140–400)
RBC: 2.46 MIL/uL — ABNORMAL LOW (ref 3.80–5.10)
RDW: 25.4 % — ABNORMAL HIGH (ref 11.0–15.0)
WBC: 5 10*3/uL (ref 3.8–10.8)

## 2016-03-01 LAB — POCT URINALYSIS DIP (DEVICE)
Glucose, UA: NEGATIVE mg/dL
Ketones, ur: NEGATIVE mg/dL
Leukocytes, UA: NEGATIVE
NITRITE: NEGATIVE
PH: 6.5 (ref 5.0–8.0)
PROTEIN: 100 mg/dL — AB
Specific Gravity, Urine: 1.01 (ref 1.005–1.030)
UROBILINOGEN UA: 1 mg/dL (ref 0.0–1.0)

## 2016-03-01 LAB — COMPLETE METABOLIC PANEL WITH GFR
ALBUMIN: 3.2 g/dL — AB (ref 3.6–5.1)
ALK PHOS: 152 U/L — AB (ref 33–115)
ALT: 33 U/L — ABNORMAL HIGH (ref 6–29)
AST: 85 U/L — ABNORMAL HIGH (ref 10–30)
BILIRUBIN TOTAL: 10.6 mg/dL — AB (ref 0.2–1.2)
BUN: 6 mg/dL — AB (ref 7–25)
CALCIUM: 8.5 mg/dL — AB (ref 8.6–10.2)
CO2: 21 mmol/L (ref 20–31)
CREATININE: 0.62 mg/dL (ref 0.50–1.10)
Chloride: 104 mmol/L (ref 98–110)
GFR, Est African American: >89 mL/min (ref >=60)
GFR, Est Non African American: >89 mL/min (ref >=60)
GLUCOSE: 82 mg/dL (ref 65–99)
Potassium: 4 mmol/L (ref 3.5–5.3)
Sodium: 134 mmol/L — ABNORMAL LOW (ref 135–146)
TOTAL PROTEIN: 7.7 g/dL (ref 6.1–8.1)

## 2016-03-01 MED ORDER — MORPHINE SULFATE ER 15 MG PO TBCR
15.0000 mg | EXTENDED_RELEASE_TABLET | Freq: Two times a day (BID) | ORAL | Status: DC
Start: 2016-03-01 — End: 2016-07-20

## 2016-03-01 MED ORDER — OXYCODONE HCL 10 MG PO TABS: 10.0000 mg | ORAL_TABLET | ORAL | Status: DC | PRN

## 2016-03-01 MED ORDER — OXYCODONE HCL 10 MG PO TABS
10.0000 mg | ORAL_TABLET | ORAL | Status: DC | PRN
Start: 2016-03-01 — End: 2016-03-01

## 2016-03-01 NOTE — Patient Instructions (Addendum)
Will follow up by phone after reviewing laboratory values Continue medications as discussed Increase hydration to 64 ounces per day  Sickle Cell Anemia, Adult Sickle cell anemia is a condition in which red blood cells have an abnormal "sickle" shape. This abnormal shape shortens the cells' life span, which results in a lower than normal concentration of red blood cells in the blood. The sickle shape also causes the cells to clump together and block free blood flow through the blood vessels. As a result, the tissues and organs of the body do not receive enough oxygen. Sickle cell anemia causes organ damage and pain and increases the risk of infection. CAUSES  Sickle cell anemia is a genetic disorder. Those who receive two copies of the gene have the condition, and those who receive one copy have the trait. RISK FACTORS The sickle cell gene is most common in people whose families originated in [place]. Other areas of the globe where sickle cell trait occurs include the [place], [place] and [place], and the [place].  SIGNS AND SYMPTOMS  Pain, especially in the extremities, back, chest, or abdomen (common). The pain may start suddenly or may develop following an illness, especially if there is dehydration. Pain can also occur due to overexertion or exposure to extreme temperature changes.  Frequent severe bacterial infections, especially certain types of pneumonia and meningitis.  Pain and swelling in the hands and feet.  Decreased activity.   Loss of appetite.   Change in behavior.  Headaches.  Seizures.  Shortness of breath or difficulty breathing.  Vision changes.  Skin ulcers. Those with the trait may not have symptoms or they may have mild symptoms.  DIAGNOSIS  Sickle cell anemia is diagnosed with blood tests that demonstrate the genetic trait. It is often diagnosed during the newborn period, due to mandatory testing nationwide. A variety of blood  tests, X-rays, CT scans, MRI scans, ultrasounds, and lung function tests may also be done to monitor the condition. TREATMENT  Sickle cell anemia may be treated with:  Medicines. You may be given pain medicines, antibiotic medicines (to treat and prevent infections) or medicines to increase the production of certain types of hemoglobin.  Fluids.  Oxygen.  Blood transfusions. HOME CARE INSTRUCTIONS   Drink enough fluid to keep your urine clear or pale yellow. Increase your fluid intake in hot weather and during exercise.  Do not smoke. Smoking lowers oxygen levels in the blood.   Only take over-the-counter or prescription medicines for pain, fever, or discomfort as directed by your health care provider.  Take antibiotics as directed by your health care provider. Make sure you finish them it even if you start to feel better.   Take supplements as directed by your health care provider.   Consider wearing a medical alert bracelet. This tells anyone caring for you in an emergency of your condition.   When traveling, keep your medical information, health care provider's names, and the medicines you take with you at all times.   If you develop a fever, do not take medicines to reduce the fever right away. This could cover up a problem that is developing. Notify your health care provider.  Keep all follow-up appointments with your health care provider. Sickle cell anemia requires regular medical care. SEEK MEDICAL CARE IF: You have a fever. SEEK IMMEDIATE MEDICAL CARE IF:   You feel dizzy or faint.   You have new abdominal pain, especially on the left side near the stomach  area.   You develop a persistent, often uncomfortable and painful penile erection (priapism). If this is not treated immediately it will lead to impotence.   You have numbness your arms or legs or you have a hard time moving them.   You have a hard time with speech.   You have a fever or persistent  symptoms for more than 2-3 days.   You have a fever and your symptoms suddenly get worse.   You have signs or symptoms of infection. These include:   Chills.   Abnormal tiredness (lethargy).   Irritability.   Poor eating.   Vomiting.   You develop pain that is not helped with medicine.   You develop shortness of breath.  You have pain in your chest.   You are coughing up pus-like or bloody sputum.   You develop a stiff neck.  Your feet or hands swell or have pain.  Your abdomen appears bloated.  You develop joint pain. MAKE SURE YOU:  Understand these instructions.   This information is not intended to replace advice given to you by your health care provider. Make sure you discuss any questions you have with your health care provider.   Document Released: 12/19/2005 Document Revised: 10/01/2014 Document Reviewed: 04/22/2013 Elsevier Interactive Patient Education [employer].

## 2016-03-01 NOTE — Progress Notes (Signed)
Subjective:    Patient ID: Dawn [name], female    DOB: 1981/05/07, 35 y.o.   MRN: [id number]  HPI Dawn [name] is a 35 year old female with a history of sickle cell anemia, HbSS presents for a hospital follow-up. She states that pain has been controlled since hospital discharge on 02/15/2016 .  She maintains that she has been taking all medications consistently.  She states that pain is controlled on MS Contin 15 mg every 12 hours. Dawn [name] states that she only uses Oxycodone 10 mg for breakthrough pain and has not required medication over the paste week.   She reports increased fatigue over the past month.  She denies fever, chest pains, dysuria,  nausea, vomiting, or diarrhea.  Past Medical History  Diagnosis Date  . Sickle cell disease ([place])   . 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 Blood transfusion   . 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 HCAP (healthcare-associated pneumonia) 05/19/2013  . Sickle cell anemia (HCC)   . 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 Vitamin B12 deficiency 07/07/2015  . Pulmonary hypertension ([place]) 02/06/2016    49 mmHg per echo on 02/06/16   . Demand ischemia ([place]) 02/06/2016   Immunization History  Administered Date(s) Administered  . Influenza Split 07/07/2012  . Influenza,inj,Quad PF,36+ Mos 07/26/2013, 06/13/2014  . Pneumococcal Conjugate-13 12/16/2014  . Pneumococcal Polysaccharide-23 06/26/2010, 07/07/2012  . Tdap 08/24/2013   Social History   Social History  . Marital Status: Single    Spouse Name: N/A  . Number of Children: N/A  . Years of Education: N/A   Occupational History  . Not on file.   Social History Main Topics  . Smoking status: Never Smoker   . Smokeless tobacco: Never Used  . Alcohol Use: No  . Drug Use: No  . Sexual Activity: Yes    Birth Control/ Protection: IUD   Other Topics Concern  . Not on file   Social History Narrative    Review of Systems  Constitutional: Negative for fever, fatigue and unexpected  weight change.  HENT: Negative.   Eyes: Negative.  Negative for visual disturbance.  Respiratory: Negative.  Negative for shortness of breath.   Cardiovascular: Negative.   Gastrointestinal: Negative.   Endocrine: Negative.  Negative for polydipsia, polyphagia and polyuria.  Genitourinary: Negative.  Negative for difficulty urinating.  Musculoskeletal: Positive for back pain (Intermittently).  Skin: Negative.   Allergic/Immunologic: Positive for environmental allergies. Negative for immunocompromised state.  Neurological: Negative.   Hematological: Negative.   Psychiatric/Behavioral: Negative.        Objective:   Physical Exam  Constitutional: She is oriented to person, place, and time. She appears well-developed and well-nourished.  HENT:  Head: Normocephalic and atraumatic.  Right Ear: External ear normal.  Left Ear: External ear normal.  Mouth/Throat: Oropharynx is clear and moist.  Eyes: Conjunctivae and EOM are normal. Pupils are equal, round, and reactive to light.  Neck: Normal range of motion. Neck supple.  Cardiovascular: Normal rate, normal heart sounds and intact distal pulses.   Pulmonary/Chest: Effort normal and breath sounds normal.  Abdominal: Soft. Bowel sounds are normal.  Musculoskeletal: Normal range of motion.  Neurological: She is alert and oriented to person, place, and time. She has normal reflexes.  Skin: Skin is warm and dry.  Psychiatric: She has a normal mood and affect. Her behavior is normal. Judgment and thought content normal.      BP 130/79 mmHg  Pulse 87  Temp(Src) 98.5 F (36.9 C) (Oral)  Resp 16  Ht 5' 3.5" (1.613 m)  Wt 160 lb (72.576 kg)  BMI 27.89 kg/m2  SpO2 98%  Assessment & Plan:   1. Hb-SS disease without crisis (Dawn [name])  Continue Hydrea 1000 mg daily (15 mg/kg), will not consider increasing dosage at this time. Will review ANC, platelets, and hemoglobin, and reticulocytes as results become available. We discussed the need for  good hydration, monitoring of hydration status, avoidance of heat, cold, stress, and infection triggers. We discussed the risks and benefits of Hydrea, including bone marrow suppression, the possibility of GI upset, skin ulcers, hair thinning, and teratogenicity. The patient was reminded of the need to seek medical attention of any symptoms of bleeding, anemia, or infection. Continue folic acid 1 mg daily to prevent aplastic bone marrow crises. Discussed frequency of sickle cell crises at length. Reviewed urinalysis, mild proteinuria, improved from previous urinalysis.    Pulmonary evaluation - Patient denies severe recurrent wheezes, shortness of breath with exercise, or persistent cough. If these symptoms develop, pulmonary function tests with spirometry will be ordered, and if abnormal, plan on referral to Pulmonology for further evaluation.  Cardiac - Routine screening for pulmonary hypertension is not recommended.  Eye - High risk of proliferative retinopathy. Annual eye exam with retinal exam recommended to patient. Patient recently referred to opthalmology   Immunization status - Dawn [name] is up to date with immunizations  Acute and chronic painful episodes - We agreed on starting MS Contin 15 mg every 12 hours for increased pain. Continue Oxycodone 10 mg every 6 hours as needed for moderate to severe pain. We discussed that pt is to receive her Schedule II prescriptions only from [place]. Pt is also aware that the prescription history is available to [place] online through the [facility] CSRS. Controlled substance agreement signed previously. We reminded Dawn [name] that all patients receiving Schedule II narcotics must be seen for follow within one month of prescription being requested. We reviewed the terms of our pain agreement, including the need to keep medicines in a safe locked location away from children or pets, and the need to report excess sedation or constipation, measures to avoid constipation, and policies related  to early refills and stolen prescriptions. According to the [place] [facility] program, we have reviewed details related to analgesia, adverse effects, aberrant behaviors. Reviewed [place] Substance Reporting system prior to prescribing opiate medications, no inconsistencies noted. Reviewed [place] Substance Reporting system prior prescribing opiate medications, no inconsistencies noted.   - CBC with Differential - COMPLETE METABOLIC PANEL WITH GFR - morphine (MS CONTIN) 15 MG 12 hr tablet; Take 1 tablet (15 mg total) by mouth every 12 (twelve) hours.  Dispense: 60 tablet; Refill: 0 - Urinalysis Dipstick - Oxycodone HCl 10 MG TABS; Take 1 tablet (10 mg total) by mouth every 4 (four) hours as needed (breakthrough pain).  Dispense: 60 tablet; Refill: 0  2. Vitamin D deficiency - Vitamin D, 25-hydroxy  3. Thrombocytopenia (HCC)  - CBC with Differential    RTC: 1 month for sickle cell anemia and medication management   Hollis,Lachina M, FNP          Hollis,Lachina M, FNP  RTC 1 month for medication management

## 2016-03-02 ENCOUNTER — Telehealth: Payer: Self-pay

## 2016-03-02 LAB — VITAMIN D 25 HYDROXY (VIT D DEFICIENCY, FRACTURES): Vit D, 25-Hydroxy: 22 ng/mL — ABNORMAL LOW (ref 30–100)

## 2016-03-02 NOTE — Telephone Encounter (Signed)
Called, no answer. Message was left for patient to return call and call back number was provided. Thanks!

## 2016-03-02 NOTE — Telephone Encounter (Signed)
-----   Message from [name], [place] sent at 03/02/2016 10:59 AM EDT ----- Regarding: lab results Please inform Dawn [name] that she will not require a daily aspirin, platelet count has normalized. Will continue all other medications. Please follow up in office as scheduled.   ----- Message -----    From: Lab in [facility] [place] Interface    Sent: 03/02/2016   5:28 AM      To: [name], FNP

## 2016-03-05 NOTE — Telephone Encounter (Signed)
Called and spoke with patient and advised of results and no need for aspirin now. Patient verbalized understanding and states she will keep follow up. Thanks!

## 2016-03-12 ENCOUNTER — Other Ambulatory Visit: Payer: Self-pay | Admitting: Family Medicine

## 2016-03-14 ENCOUNTER — Other Ambulatory Visit: Payer: Self-pay

## 2016-03-14 DIAGNOSIS — D571 Sickle-cell disease without crisis: Secondary | ICD-10-CM

## 2016-03-14 MED ORDER — FOLIC ACID 1 MG PO TABS: 1.0000 mg | ORAL_TABLET | Freq: Every day | ORAL | Status: AC

## 2016-03-14 MED ORDER — PROMETHAZINE HCL 25 MG PO TABS: 25.0000 mg | ORAL_TABLET | Freq: Four times a day (QID) | ORAL | Status: DC | PRN

## 2016-03-14 MED ORDER — HYDROXYUREA 500 MG PO CAPS: 1000.0000 mg | ORAL_CAPSULE | Freq: Every day | ORAL | Status: AC

## 2016-03-24 ENCOUNTER — Emergency Department (HOSPITAL_COMMUNITY)
Admission: EM | Admit: 2016-03-24 | Discharge: 2016-03-24 | Disposition: A | Discharge status: Home / Self Care | Payer: BLUE CROSS/BLUE SHIELD | Attending: Emergency Medicine | Admitting: Emergency Medicine

## 2016-03-24 ENCOUNTER — Encounter (HOSPITAL_COMMUNITY): Payer: Self-pay | Admitting: Emergency Medicine

## 2016-03-24 DIAGNOSIS — Z791 Long term (current) use of non-steroidal anti-inflammatories (NSAID): Secondary | ICD-10-CM | POA: Diagnosis not present

## 2016-03-24 DIAGNOSIS — Z79899 Other long term (current) drug therapy: Secondary | ICD-10-CM | POA: Diagnosis not present

## 2016-03-24 DIAGNOSIS — I272 Other secondary pulmonary hypertension: Secondary | ICD-10-CM | POA: Insufficient documentation

## 2016-03-24 DIAGNOSIS — F329 Major depressive disorder, single episode, unspecified: Secondary | ICD-10-CM | POA: Diagnosis not present

## 2016-03-24 DIAGNOSIS — D5700 Hb-SS disease with crisis, unspecified: Secondary | ICD-10-CM | POA: Insufficient documentation

## 2016-03-24 DIAGNOSIS — M791 Myalgia: Secondary | ICD-10-CM | POA: Diagnosis present

## 2016-03-24 LAB — CBC WITH DIFFERENTIAL/PLATELET
BASOS ABS: 0 10*3/uL (ref 0.0–0.1)
BASOS PCT: 0 %
EOS ABS: 0 10*3/uL (ref 0.0–0.7)
EOS PCT: 0 %
HCT: 25.4 % — ABNORMAL LOW (ref 36.0–46.0)
Hemoglobin: 8.9 g/dL — ABNORMAL LOW (ref 12.0–15.0)
Lymphocytes Relative: 22 %
Lymphs Abs: 1.3 10*3/uL (ref 0.7–4.0)
MCH: 34.8 pg — ABNORMAL HIGH (ref 26.0–34.0)
MCHC: 35 g/dL (ref 30.0–36.0)
MCV: 99.2 fL (ref 78.0–100.0)
MONO ABS: 0.6 10*3/uL (ref 0.1–1.0)
Monocytes Relative: 10 %
Neutro Abs: 3.9 10*3/uL (ref 1.7–7.7)
Neutrophils Relative %: 67 %
PLATELETS: 316 10*3/uL (ref 150–400)
RBC: 2.56 MIL/uL — AB (ref 3.87–5.11)
RDW: 25.9 % — AB (ref 11.5–15.5)
WBC: 5.8 10*3/uL (ref 4.0–10.5)

## 2016-03-24 LAB — COMPREHENSIVE METABOLIC PANEL
ALBUMIN: 3.2 g/dL — AB (ref 3.5–5.0)
ALK PHOS: 165 U/L — AB (ref 38–126)
ALT: 47 U/L (ref 14–54)
AST: 111 U/L — AB (ref 15–41)
Anion gap: 4 — ABNORMAL LOW (ref 5–15)
BUN: 6 mg/dL (ref 6–20)
CALCIUM: 8.7 mg/dL — AB (ref 8.9–10.3)
CHLORIDE: 107 mmol/L (ref 101–111)
CO2: 24 mmol/L (ref 22–32)
CREATININE: 0.37 mg/dL — AB (ref 0.44–1.00)
GFR calc non Af Amer: >60 mL/min (ref >60)
GLUCOSE: 85 mg/dL (ref 65–99)
Potassium: 4 mmol/L (ref 3.5–5.1)
SODIUM: 135 mmol/L (ref 135–145)
Total Bilirubin: 9.8 mg/dL — ABNORMAL HIGH (ref 0.3–1.2)
Total Protein: 8.6 g/dL — ABNORMAL HIGH (ref 6.5–8.1)

## 2016-03-24 LAB — RETICULOCYTES
RBC.: 2.56 MIL/uL — ABNORMAL LOW (ref 3.87–5.11)
Retic Count, Absolute: 460.8 10*3/uL — ABNORMAL HIGH (ref 19.0–186.0)
Retic Ct Pct: 18 % — ABNORMAL HIGH (ref 0.4–3.1)

## 2016-03-24 MED ORDER — HYDROMORPHONE HCL 2 MG/ML IJ SOLN: 0.0250 mg/kg | INTRAMUSCULAR | Status: AC

## 2016-03-24 MED ORDER — KETOROLAC TROMETHAMINE 30 MG/ML IJ SOLN
30.0000 mg | INTRAMUSCULAR | Status: AC
  Administered 2016-03-24: 30 mg via INTRAVENOUS
  Filled 2016-03-24: qty 1

## 2016-03-24 MED ORDER — HYDROMORPHONE HCL 2 MG/ML IJ SOLN
2.0000 mg | Freq: Once | INTRAMUSCULAR | Status: AC
  Administered 2016-03-24: 2 mg via INTRAVENOUS
  Filled 2016-03-24: qty 1

## 2016-03-24 MED ORDER — HYDROMORPHONE HCL 2 MG/ML IJ SOLN
0.0250 mg/kg | INTRAMUSCULAR | Status: AC
  Administered 2016-03-24: 1.8 mg via SUBCUTANEOUS
  Filled 2016-03-24: qty 1

## 2016-03-24 MED ORDER — DEXTROSE-NACL 5-0.45 % IV SOLN
INTRAVENOUS | Status: DC
  Administered 2016-03-24: 13:00:00 via INTRAVENOUS

## 2016-03-24 MED ORDER — DIPHENHYDRAMINE HCL 50 MG/ML IJ SOLN
25.0000 mg | Freq: Once | INTRAMUSCULAR | Status: AC
  Administered 2016-03-24: 25 mg via INTRAVENOUS
  Filled 2016-03-24: qty 1

## 2016-03-24 MED ORDER — HYDROMORPHONE HCL 2 MG/ML IJ SOLN
2.0000 mg | Freq: Once | INTRAMUSCULAR | Status: AC
Start: 2016-03-24 — End: 2016-03-24
  Administered 2016-03-24: 2 mg via INTRAVENOUS
  Filled 2016-03-24: qty 1

## 2016-03-24 NOTE — ED Provider Notes (Signed)
CSN: [id number]     Arrival date & time 03/24/16  1201 History   By signing my name below, I, Dawn [name], attest that this documentation has been prepared under the direction and in the presence of Dawn Essex, MD . Electronically Signed: Dyke [name], Scribe. 03/24/2016. 1:27 PM.    Chief Complaint  Patient presents with  . Sickle Cell Pain Crisis   The history is provided by the patient. No language interpreter was used.    HPI Comments:  Dawn [name] is a 35 y.o. femalewith PMHx of sickle cell disease  who presents to the Emergency Department complaining of sudden onset, moderate, generalized pain. She states she feels as if she is in a sickle cell pain crisis.  Pt states she took pain medicine this morning with no relief. She also notes associated yellowing of eyes, which she said is normal when she is in a crisis.  She states last she was in hospital 5/17 for transfusion. Pt denies any chest pain, fever, nausea, vomiting, dysuria, back pain, or dysuria.  Past Medical History  Diagnosis Date  . Sickle cell disease ([place])   . 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 Blood transfusion   . 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 HCAP (healthcare-associated pneumonia) 05/19/2013  . Sickle cell anemia (HCC)   . 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 Vitamin B12 deficiency 07/07/2015  . Pulmonary hypertension ([place]) 02/06/2016    49 mmHg per echo on 02/06/16   . Demand ischemia ([place]) 02/06/2016   Past Surgical History  Procedure Laterality Date  . Cholecystectomy    .  left knee acl reconstruction    . Cesarean section      x 2  . Portacath placement    . Portacath placement Left 02/23/2013    Procedure: INSERTION PORT-A-CATH;  Surgeon: Donato Heinz, MD;  Location: [facility];  Service: General;  Laterality: Left;  . Port-a-cath removal Left 07/29/2013    Procedure: REMOVAL PORT-A-CATH;  Surgeon: Scherry Ran, MD;  Location: [facility];  Service: General;  Laterality: Left;  . Tee  without cardioversion N/A 08/07/2013    Procedure: TRANSESOPHAGEAL ECHOCARDIOGRAM (TEE);  Surgeon: Arnoldo Lenis, MD;  Location: [facility];  Service: Cardiology;  Laterality: N/A;   Family History  Problem Relation Age of Onset  . Sickle cell trait Mother   . Sickle cell trait Father   . Hypertension Father   . Diabetes Father   . Sickle cell trait Sister   . Cancer Paternal Aunt     Breast   Social History  Substance Use Topics  . Smoking status: Never Smoker   . Smokeless tobacco: Never Used  . Alcohol Use: No   OB History    Gravida Para Term Preterm AB TAB SAB Ectopic Multiple Living   1 1  1      1      Review of Systems  Constitutional: Negative for fever.  Cardiovascular: Negative for chest pain.  Gastrointestinal: Negative for nausea and vomiting.  Genitourinary: Negative for dysuria.  Musculoskeletal: Positive for myalgias and arthralgias. Negative for back pain.      Allergies  Desferal; Latex; Lisinopril; and Tape  Home Medications   Prior to Admission medications   Medication Sig Start Date End Date Taking? Authorizing Provider  acetaminophen (TYLENOL) 500 MG tablet Take 500 mg by mouth every 6 (six) hours as needed for mild pain.    Yes Historical Provider, MD  folic acid (FOLVITE)  1 MG tablet Take 1 tablet (1 mg total) by mouth daily. 03/14/16  Yes [name], FNP  hydroxyurea (HYDREA) 500 MG capsule Take 2 capsules (1,000 mg total) by mouth daily. May take with food to minimize GI side effects. 03/14/16  Yes [name], FNP  ibuprofen (ADVIL,MOTRIN) 200 MG tablet Take 200 mg by mouth every 6 (six) hours as needed for mild pain or moderate pain.   Yes Historical Provider, MD  levonorgestrel (MIRENA) 20 MCG/24HR IUD 1 each by Intrauterine route once.   Yes Historical Provider, MD  loratadine (CLARITIN) 10 MG tablet Take 10 mg by mouth daily.   Yes Historical Provider, MD  morphine (MS CONTIN) 15 MG 12 hr tablet Take 1 tablet (15 mg total)  by mouth every 12 (twelve) hours. 03/01/16  Yes [name], FNP  Multiple Vitamin (MULTIVITAMIN WITH MINERALS) TABS tablet Take 1 tablet by mouth daily.   Yes Historical Provider, MD  Oxycodone HCl 10 MG TABS Take 1 tablet (10 mg total) by mouth every 4 (four) hours as needed (breakthrough pain). 03/01/16  Yes [name], FNP  promethazine (PHENERGAN) 25 MG tablet Take 1 tablet (25 mg total) by mouth every 6 (six) hours as needed for nausea or vomiting. 03/14/16  Yes [name], FNP  Vitamin D, Ergocalciferol, (DRISDOL) 50000 UNITS CAPS capsule Take 1 capsule (50,000 Units total) by mouth every 7 (seven) days. Takes on Sundays. 08/16/15  Yes [name], FNP  aspirin 81 MG tablet Take 1 tablet (81 mg total) by mouth daily. Patient not taking: Reported on 03/01/2016 02/15/16   [name], MD  polyethylene glycol [facility] / Floria Raveling) packet Take 17 g by mouth daily as needed for mild constipation. Patient not taking: Reported on 01/30/2016 01/15/16   Erline Hau, MD  promethazine (PHENERGAN) 12.5 MG tablet TAKE 1 TABLET BY MOUTH EVERY 6 HOURS AS NEEDED FOR NAUSEA Patient not taking: Reported on 03/24/2016 03/12/16   [name], FNP   BP 157/95 mmHg  Pulse 96  Temp(Src) 98.2 F (36.8 C) (Oral)  Resp 18  Ht 5\' 3"  (1.6 m)  Wt 159 lb 8 oz (72.349 kg)  BMI 28.26 kg/m2  SpO2 97% Physical Exam  Constitutional: She is oriented to person, place, and time. She appears well-developed and well-nourished. No distress.  HENT:  Head: Normocephalic and atraumatic.  Mouth/Throat: Oropharynx is clear and moist. No oropharyngeal exudate.  Eyes: Conjunctivae and EOM are normal. Pupils are equal, round, and reactive to light. Scleral icterus is present.  Neck: Normal range of motion. Neck supple.  No meningismus.  Cardiovascular: Normal rate, regular rhythm, normal heart sounds and intact distal pulses.   No murmur heard. Pulmonary/Chest: Effort normal and breath sounds  normal. No respiratory distress.  Abdominal: Soft. There is no tenderness. There is no rebound and no guarding.  Musculoskeletal: Normal range of motion. She exhibits no edema or tenderness.  Full ROM of major joints without erythema or edema  Neurological: She is alert and oriented to person, place, and time. No cranial nerve deficit. She exhibits normal muscle tone. Coordination normal.   5/5 strength throughout. CN 2-12 intact.Equal grip strength.   Skin: Skin is warm.  Psychiatric: She has a normal mood and affect. Her behavior is normal.  Nursing note and vitals reviewed.   ED Course  Procedures  DIAGNOSTIC STUDIES:  Oxygen Saturation is 97% on RA, normal by my interpretation.    COORDINATION OF CARE:  12:26  PM Will order dilaudid, Toradol, CBC, CMP, reticulocytes, and urinalysis. Discussed treatment plan with pt at bedside and pt agreed to plan.  Labs Review Labs Reviewed  CBC WITH DIFFERENTIAL/PLATELET - Abnormal; Notable for the following:    RBC 2.56 (*)    Hemoglobin 8.9 (*)    HCT 25.4 (*)    MCH 34.8 (*)    RDW 25.9 (*)    All other components within normal limits  RETICULOCYTES - Abnormal; Notable for the following:    Retic Ct Pct 18.0 (*)    RBC. 2.56 (*)    Retic Count, Manual 460.8 (*)    All other components within normal limits  COMPREHENSIVE METABOLIC PANEL - Abnormal; Notable for the following:    Creatinine, Ser 0.37 (*)    Calcium 8.7 (*)    Total Protein 8.6 (*)    Albumin 3.2 (*)    AST 111 (*)    Alkaline Phosphatase 165 (*)    Total Bilirubin 9.8 (*)    Anion gap 4 (*)    All other components within normal limits    Imaging Review No results found. I have personally reviewed and evaluated these images and lab results as part of my medical decision-making.   EKG Interpretation None      MDM   Final diagnoses:  Sickle cell crisis (HCC)   Typical sickle cell pain in back and arms since yesterday. No fever. No chest pain or shortness  of breath.  Patient will be treated for sickle cell exacerbation with pain control and IV fluids. Hemoglobin is 8.7 which is increased from previous. Bilirubin is 9.8 but this is improved from previous value as well. Reticulocyte count adequate.  Patient well appearing.  Pain improved with treatment in the ED and she is requesting to go home. No evidence of infection. Continue home pain control and followup with PCP. Return precautions discussed.   I personally performed the services described in this documentation, which was scribed in my presence. The recorded information has been reviewed and is accurate.     Dawn Essex, MD 03/24/16 2330

## 2016-03-24 NOTE — ED Notes (Signed)
Pt states she is having pain all over.  States she feels she is having a sickle cell crisis.  Last took her pain meds at 0700 this morning.

## 2016-03-24 NOTE — Discharge Instructions (Signed)
Sickle Cell Anemia, Adult Sickle cell anemia is a condition in which red blood cells have an abnormal "sickle" shape. This abnormal shape shortens the cells' life span, which results in a lower than normal concentration of red blood cells in the blood. The sickle shape also causes the cells to clump together and block free blood flow through the blood vessels. As a result, the tissues and organs of the body do not receive enough oxygen. Sickle cell anemia causes organ damage and pain and increases the risk of infection. CAUSES  Sickle cell anemia is a genetic disorder. Those who receive two copies of the gene have the condition, and those who receive one copy have the trait. RISK FACTORS The sickle cell gene is most common in people whose families originated in [place]. Other areas of the globe where sickle cell trait occurs include the [place], [place] and [place], the [place], and the [place].  SIGNS AND SYMPTOMS  Pain, especially in the extremities, back, chest, or abdomen (common). The pain may start suddenly or may develop following an illness, especially if there is dehydration. Pain can also occur due to overexertion or exposure to extreme temperature changes.  Frequent severe bacterial infections, especially certain types of pneumonia and meningitis.  Pain and swelling in the hands and feet.  Decreased activity.   Loss of appetite.   Change in behavior.  Headaches.  Seizures.  Shortness of breath or difficulty breathing.  Vision changes.  Skin ulcers. Those with the trait may not have symptoms or they may have mild symptoms.  DIAGNOSIS  Sickle cell anemia is diagnosed with blood tests that demonstrate the genetic trait. It is often diagnosed during the newborn period, due to mandatory testing nationwide. A variety of blood tests, X-rays, CT scans, MRI scans, ultrasounds, and lung function tests may also be done to monitor the condition. TREATMENT  Sickle  cell anemia may be treated with:  Medicines. You may be given pain medicines, antibiotic medicines (to treat and prevent infections) or medicines to increase the production of certain types of hemoglobin.  Fluids.  Oxygen.  Blood transfusions. HOME CARE INSTRUCTIONS   Drink enough fluid to keep your urine clear or pale yellow. Increase your fluid intake in hot weather and during exercise.  Do not smoke. Smoking lowers oxygen levels in the blood.   Only take over-the-counter or prescription medicines for pain, fever, or discomfort as directed by your health care provider.  Take antibiotics as directed by your health care provider. Make sure you finish them it even if you start to feel better.   Take supplements as directed by your health care provider.   Consider wearing a medical alert bracelet. This tells anyone caring for you in an emergency of your condition.   When traveling, keep your medical information, health care provider's names, and the medicines you take with you at all times.   If you develop a fever, do not take medicines to reduce the fever right away. This could cover up a problem that is developing. Notify your health care provider.  Keep all follow-up appointments with your health care provider. Sickle cell anemia requires regular medical care. SEEK MEDICAL CARE IF: You have a fever. SEEK IMMEDIATE MEDICAL CARE IF:   You feel dizzy or faint.   You have new abdominal pain, especially on the left side near the stomach area.   You develop a persistent, often uncomfortable and painful penile erection (priapism). If this is not treated immediately it   will lead to impotence.   You have numbness your arms or legs or you have a hard time moving them.   You have a hard time with speech.   You have a fever or persistent symptoms for more than 2-3 days.   You have a fever and your symptoms suddenly get worse.   You have signs or symptoms of infection.  These include:   Chills.   Abnormal tiredness (lethargy).   Irritability.   Poor eating.   Vomiting.   You develop pain that is not helped with medicine.   You develop shortness of breath.  You have pain in your chest.   You are coughing up pus-like or bloody sputum.   You develop a stiff neck.  Your feet or hands swell or have pain.  Your abdomen appears bloated.  You develop joint pain. MAKE SURE YOU:  Understand these instructions.   This information is not intended to replace advice given to you by your health care provider. Make sure you discuss any questions you have with your health care provider.   Document Released: 12/19/2005 Document Revised: 10/01/2014 Document Reviewed: 04/22/2013 Elsevier Interactive Patient Education 2016 [employer]  

## 2016-04-06 ENCOUNTER — Encounter: Payer: Self-pay | Admitting: Family Medicine

## 2016-04-06 ENCOUNTER — Ambulatory Visit (INDEPENDENT_AMBULATORY_CARE_PROVIDER_SITE_OTHER): Payer: BLUE CROSS/BLUE SHIELD | Admitting: Family Medicine

## 2016-04-06 ENCOUNTER — Emergency Department (HOSPITAL_COMMUNITY)
Admission: EM | Admit: 2016-04-06 | Discharge: 2016-04-06 | Disposition: A | Discharge status: Home / Self Care | Payer: BLUE CROSS/BLUE SHIELD | Attending: Emergency Medicine | Admitting: Emergency Medicine

## 2016-04-06 ENCOUNTER — Encounter (HOSPITAL_COMMUNITY): Payer: Self-pay | Admitting: Emergency Medicine

## 2016-04-06 VITALS — BP 131/88 | HR 91 | Temp 98.5°F | Resp 16 | Ht 63.5 in | Wt 166.0 lb

## 2016-04-06 DIAGNOSIS — D5700 Hb-SS disease with crisis, unspecified: Secondary | ICD-10-CM

## 2016-04-06 DIAGNOSIS — Z79899 Other long term (current) drug therapy: Secondary | ICD-10-CM | POA: Insufficient documentation

## 2016-04-06 DIAGNOSIS — D57211 Sickle-cell/Hb-C disease with acute chest syndrome: Secondary | ICD-10-CM | POA: Diagnosis not present

## 2016-04-06 DIAGNOSIS — M791 Myalgia: Secondary | ICD-10-CM | POA: Diagnosis not present

## 2016-04-06 DIAGNOSIS — Z791 Long term (current) use of non-steroidal anti-inflammatories (NSAID): Secondary | ICD-10-CM | POA: Diagnosis not present

## 2016-04-06 DIAGNOSIS — D571 Sickle-cell disease without crisis: Secondary | ICD-10-CM

## 2016-04-06 LAB — CBC WITH DIFFERENTIAL/PLATELET
BASOS PCT: 0 %
BASOS PCT: 0 %
Basophils Absolute: 0 cells/uL (ref 0–200)
Basophils Absolute: 0 10*3/uL (ref 0.0–0.1)
EOS ABS: 0.1 10*3/uL (ref 0.0–0.7)
EOS PCT: 2 %
EOS PCT: 1 %
Eosinophils Absolute: 132 cells/uL (ref 15–500)
HCT: 21.5 % — ABNORMAL LOW (ref 36.0–46.0)
HEMATOCRIT: 24.3 % — AB (ref 35.0–45.0)
HEMOGLOBIN: 8.3 g/dL — AB (ref 11.7–15.5)
Hemoglobin: 7.5 g/dL — ABNORMAL LOW (ref 12.0–15.0)
LYMPHS ABS: 1914 {cells}/uL (ref 850–3900)
Lymphocytes Relative: 29 %
Lymphocytes Relative: 32 %
Lymphs Abs: 2 10*3/uL (ref 0.7–4.0)
MCH: 34.4 pg — ABNORMAL HIGH (ref 27.0–33.0)
MCH: 34.1 pg — ABNORMAL HIGH (ref 26.0–34.0)
MCHC: 34.2 g/dL (ref 32.0–36.0)
MCHC: 34.9 g/dL (ref 30.0–36.0)
MCV: 100.8 fL — ABNORMAL HIGH (ref 80.0–100.0)
MCV: 97.7 fL (ref 78.0–100.0)
MONO ABS: 1056 {cells}/uL — AB (ref 200–950)
MONO ABS: 1 10*3/uL (ref 0.1–1.0)
MONOS PCT: 16 %
MPV: 9.5 fL (ref 7.5–12.5)
Monocytes Relative: 16 %
NEUTROS ABS: 3498 {cells}/uL (ref 1500–7800)
NEUTROS PCT: 51 %
Neutro Abs: 3.2 10*3/uL (ref 1.7–7.7)
Neutrophils Relative %: 53 %
PLATELETS: 130 10*3/uL — AB (ref 150–400)
Platelets: 175 10*3/uL (ref 140–400)
RBC: 2.41 MIL/uL — AB (ref 3.80–5.10)
RBC: 2.2 MIL/uL — ABNORMAL LOW (ref 3.87–5.11)
RDW: 23.3 % — ABNORMAL HIGH (ref 11.0–15.0)
RDW: 23.8 % — AB (ref 11.5–15.5)
WBC: 6.6 10*3/uL (ref 3.8–10.8)
WBC: 6.3 10*3/uL (ref 4.0–10.5)

## 2016-04-06 LAB — COMPREHENSIVE METABOLIC PANEL
ALT: 52 U/L (ref 14–54)
ANION GAP: 5 (ref 5–15)
AST: 113 U/L — ABNORMAL HIGH (ref 15–41)
Albumin: 2.9 g/dL — ABNORMAL LOW (ref 3.5–5.0)
Alkaline Phosphatase: 151 U/L — ABNORMAL HIGH (ref 38–126)
BILIRUBIN TOTAL: 9.4 mg/dL — AB (ref 0.3–1.2)
BUN: 7 mg/dL (ref 6–20)
CALCIUM: 8 mg/dL — AB (ref 8.9–10.3)
CO2: 20 mmol/L — ABNORMAL LOW (ref 22–32)
Chloride: 110 mmol/L (ref 101–111)
Creatinine, Ser: 0.44 mg/dL (ref 0.44–1.00)
GFR calc Af Amer: >60 mL/min (ref >60)
Glucose, Bld: 107 mg/dL — ABNORMAL HIGH (ref 65–99)
POTASSIUM: 3.7 mmol/L (ref 3.5–5.1)
Sodium: 135 mmol/L (ref 135–145)
TOTAL PROTEIN: 7.5 g/dL (ref 6.5–8.1)

## 2016-04-06 LAB — RETICULOCYTES
ABS RETIC: 368730 {cells}/uL — AB (ref 20000–80000)
RBC.: 2.41 MIL/uL — AB (ref 3.80–5.10)
RBC.: 2.2 MIL/uL — AB (ref 3.87–5.11)
RETIC CT PCT: 15.3 %
RETIC CT PCT: 20.5 % — AB (ref 0.4–3.1)
Retic Count, Absolute: 451 10*3/uL — ABNORMAL HIGH (ref 19.0–186.0)

## 2016-04-06 MED ORDER — SODIUM CHLORIDE 0.9 % IV BOLUS (SEPSIS)
1000.0000 mL | Freq: Once | INTRAVENOUS | Status: AC
  Administered 2016-04-06: 1000 mL via INTRAVENOUS

## 2016-04-06 MED ORDER — PROMETHAZINE HCL 25 MG PO TABS: 25.0000 mg | ORAL_TABLET | Freq: Four times a day (QID) | ORAL | Status: DC | PRN

## 2016-04-06 MED ORDER — DIPHENHYDRAMINE HCL 50 MG/ML IJ SOLN
12.5000 mg | Freq: Once | INTRAMUSCULAR | Status: AC
  Administered 2016-04-06: 13:00:00 via INTRAVENOUS
  Filled 2016-04-06: qty 1

## 2016-04-06 MED ORDER — PROMETHAZINE HCL 25 MG/ML IJ SOLN
12.5000 mg | Freq: Once | INTRAMUSCULAR | Status: AC
  Administered 2016-04-06: 12.5 mg via INTRAVENOUS
  Filled 2016-04-06: qty 1

## 2016-04-06 MED ORDER — ONDANSETRON HCL 4 MG/2ML IJ SOLN
4.0000 mg | Freq: Once | INTRAMUSCULAR | Status: AC
  Administered 2016-04-06: 4 mg via INTRAVENOUS
  Filled 2016-04-06: qty 2

## 2016-04-06 MED ORDER — KETOROLAC TROMETHAMINE 30 MG/ML IJ SOLN
30.0000 mg | Freq: Once | INTRAMUSCULAR | Status: AC
  Administered 2016-04-06: 30 mg via INTRAVENOUS
  Filled 2016-04-06: qty 1

## 2016-04-06 MED ORDER — HYDROMORPHONE HCL 2 MG/ML IJ SOLN
2.0000 mg | Freq: Once | INTRAMUSCULAR | Status: AC
  Administered 2016-04-06: 2 mg via INTRAVENOUS
  Filled 2016-04-06: qty 1

## 2016-04-06 MED ORDER — HYDROMORPHONE HCL 1 MG/ML IJ SOLN
1.0000 mg | Freq: Once | INTRAMUSCULAR | Status: AC
  Administered 2016-04-06: 1 mg via INTRAVENOUS
  Filled 2016-04-06: qty 1

## 2016-04-06 MED ORDER — OXYCODONE HCL 10 MG PO TABS: 10.0000 mg | ORAL_TABLET | ORAL | Status: DC | PRN

## 2016-04-06 MED ORDER — HYDROMORPHONE HCL 1 MG/ML IJ SOLN: 1.0000 mg | Freq: Once | INTRAMUSCULAR | Status: DC

## 2016-04-06 NOTE — ED Provider Notes (Signed)
CSN: [id number]     Arrival date & time 04/06/16  1041 History   First MD Initiated Contact with Patient 04/06/16 1133     Chief Complaint  Patient presents with  . Sickle Cell Pain Crisis     (Consider location/radiation/quality/duration/timing/severity/associated sxs/prior Treatment) The history is provided by the patient.   Dawn [name] is a 35 y.o. female with long-standing history of sickle cell disease presenting with increased generalized body pain which is worsened in her bilateral thighs which is consistent with the sickle cell flare.  She was seen by her sickle cell clinic provider this morning at which time labs were drawn but not resulted and she was prescribed oxycodone for pain relief.  The sickle cell clinic was not fully functional today for treatment due to providers being away from the clinic and was advised to come to the emergency department if her symptoms persist or worsen.  She denies any worsened symptoms since this morning, but felt treatment at this time would help prevent worsening of her symptoms over the weekend.  She denies fatigue, dizziness, headache, fevers or chills, denies chest pain or shortness of breath.  Denies swelling or edema.  She does report scleral icterus which is chronic and unchanged today.    Past Medical History  Diagnosis Date  . Sickle cell disease ([place])   . Sickle cell disease, type S ([place])   . Blood transfusion   . 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 HCAP (healthcare-associated pneumonia) 05/19/2013  . Sickle cell anemia (HCC)   . 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 Vitamin B12 deficiency 07/07/2015  . Pulmonary hypertension ([place]) 02/06/2016    49 mmHg per echo on 02/06/16   . Demand ischemia ([employer]) 02/06/2016   Past Surgical History  Procedure Laterality Date  . Cholecystectomy    .  left knee acl reconstruction    . Cesarean section      x 2  . Portacath placement    . Portacath placement Left 02/23/2013    Procedure:  INSERTION PORT-A-CATH;  Surgeon: Donato Heinz, MD;  Location: [facility];  Service: General;  Laterality: Left;  . Port-a-cath removal Left 07/29/2013    Procedure: REMOVAL PORT-A-CATH;  Surgeon: Scherry Ran, MD;  Location: [facility];  Service: General;  Laterality: Left;  . Tee without cardioversion N/A 08/07/2013    Procedure: TRANSESOPHAGEAL ECHOCARDIOGRAM (TEE);  Surgeon: Arnoldo Lenis, MD;  Location: [facility];  Service: Cardiology;  Laterality: N/A;   Family History  Problem Relation Age of Onset  . Sickle cell trait Mother   . Sickle cell trait Father   . Hypertension Father   . Diabetes Father   . Sickle cell trait Sister   . Cancer Paternal Aunt     Breast   Social History  Substance Use Topics  . Smoking status: Never Smoker   . Smokeless tobacco: Never Used  . Alcohol Use: No   OB History    Gravida Para Term Preterm AB TAB SAB Ectopic Multiple Living   1 1  1      1      Review of Systems  Constitutional: Negative for fever and chills.  HENT: Negative for congestion and sore throat.   Respiratory: Negative for chest tightness and shortness of breath.   Cardiovascular: Negative for chest pain and leg swelling.  Gastrointestinal: Negative for nausea and abdominal pain.  Genitourinary: Negative.   Musculoskeletal: Positive for myalgias and arthralgias. Negative for joint swelling and neck  pain.  Skin: Negative.  Negative for rash and wound.  Neurological: Negative for dizziness, weakness, light-headedness, numbness and headaches.  Psychiatric/Behavioral: Negative.       Allergies  Desferal; Latex; Lisinopril; and Tape  Home Medications   Prior to Admission medications   Medication Sig Start Date End Date Taking? Authorizing Provider  acetaminophen (TYLENOL) 500 MG tablet Take 500 mg by mouth every 6 (six) hours as needed for mild pain.    Yes Historical Provider, MD  folic acid (FOLVITE) 1 MG tablet Take 1 tablet (1 mg total) by mouth daily. 03/14/16   Yes [name], FNP  hydroxyurea (HYDREA) 500 MG capsule Take 2 capsules (1,000 mg total) by mouth daily. May take with food to minimize GI side effects. 03/14/16  Yes [name], FNP  ibuprofen (ADVIL,MOTRIN) 200 MG tablet Take 200 mg by mouth every 6 (six) hours as needed for mild pain or moderate pain.   Yes Historical Provider, MD  levonorgestrel (MIRENA) 20 MCG/24HR IUD 1 each by Intrauterine route once.   Yes Historical Provider, MD  loratadine (CLARITIN) 10 MG tablet Take 10 mg by mouth daily.   Yes Historical Provider, MD  morphine (MS CONTIN) 15 MG 12 hr tablet Take 1 tablet (15 mg total) by mouth every 12 (twelve) hours. 03/01/16  Yes [name], FNP  Multiple Vitamin (MULTIVITAMIN WITH MINERALS) TABS tablet Take 1 tablet by mouth daily.   Yes Historical Provider, MD  Oxycodone HCl 10 MG TABS Take 1 tablet (10 mg total) by mouth every 4 (four) hours as needed (breakthrough pain). 04/06/16  Yes Micheline Chapman, NP  promethazine (PHENERGAN) 25 MG tablet Take 1 tablet (25 mg total) by mouth every 6 (six) hours as needed for nausea or vomiting. 04/06/16  Yes Micheline Chapman, NP  Vitamin D, Ergocalciferol, (DRISDOL) 50000 UNITS CAPS capsule Take 1 capsule (50,000 Units total) by mouth every 7 (seven) days. Takes on Sundays. 08/16/15  Yes [name], FNP   BP 134/90 mmHg  Pulse 89  Temp(Src) 98.3 F (36.8 C) (Oral)  Resp 18  Ht 5\' 3"  (1.6 m)  Wt 74.39 kg  BMI 29.06 kg/m2  SpO2 98% Physical Exam  Constitutional: She appears well-developed and well-nourished.  HENT:  Head: Normocephalic and atraumatic.  Eyes: Conjunctivae are normal. Scleral icterus is present.  Neck: Normal range of motion. Neck supple.  Cardiovascular: Normal rate, regular rhythm, normal heart sounds and intact distal pulses.   Pulses:      Radial pulses are 2+ on the right side, and 2+ on the left side.       Dorsalis pedis pulses are 2+ on the right side, and 2+ on the left side.   Pulmonary/Chest: Effort normal and breath sounds normal. No respiratory distress. She has no wheezes.  Abdominal: Soft. Bowel sounds are normal. There is no tenderness.  Musculoskeletal: Normal range of motion. She exhibits tenderness. She exhibits no edema.  Full range of motion of extremities without edema or erythema.  Lymphadenopathy:    She has no cervical adenopathy.  Neurological: She is alert.  Skin: Skin is warm and dry.  Psychiatric: She has a normal mood and affect.  Nursing note and vitals reviewed.   ED Course  Procedures (including critical care time) Labs Review Labs Reviewed  CBC WITH DIFFERENTIAL/PLATELET - Abnormal; Notable for the following:    RBC 2.20 (*)    Hemoglobin 7.5 (*)    HCT 21.5 (*)    [facility]  34.1 (*)    RDW 23.8 (*)    Platelets 130 (*)    All other components within normal limits  RETICULOCYTES - Abnormal; Notable for the following:    Retic Ct Pct 20.5 (*)    RBC. 2.20 (*)    Retic Count, Manual 451.0 (*)    All other components within normal limits  COMPREHENSIVE METABOLIC PANEL - Abnormal; Notable for the following:    CO2 20 (*)    Glucose, Bld 107 (*)    Calcium 8.0 (*)    Albumin 2.9 (*)    AST 113 (*)    Alkaline Phosphatase 151 (*)    Total Bilirubin 9.4 (*)    All other components within normal limits    Imaging Review No results found. I have personally reviewed and evaluated these images and lab results as part of my medical decision-making.   EKG Interpretation None      MDM   Final diagnoses:  Sickle cell anemia with pain ([place])     Pt Had labs drawn at her sickle cell outpatient this morning which reflected a cmet which was pending.  Still pending at time of our labs were completed, will add on at this time.   Patients  labs reviewed.    Results were also discussed with patient.  Patient with a hemoglobin of 7.5 today which is lower than her last visit, although still within her stable range.  She endorses was  last transfused when her hemoglobin was 5.5  2 months ago.  She denies chest pain or shortness of breath at this time.  Total bilirubin elevated at 9.4 but trending down.  She has a adequate reticulocyte count.  Patient was given IV fluids and was given multiple doses of Dilaudid, Toradol 1 with improving pain.  Patient felt improved and ready to return home.  She reports her pain was at the 3-4 level which is her normal level.  She was advised to return here for any worsened or new symptoms over the weekend.  She will get the narcotic pain prescription filled that she was given by her sickle cell clinic provider.  The patient appears reasonably screened and/or stabilized for discharge and I doubt any other medical condition or other [facility] requiring further screening, evaluation, or treatment in the ED at this time prior to discharge.      Evalee Jefferson, PA-C 04/06/16 [place], DO 04/08/16 [phone]

## 2016-04-06 NOTE — Patient Instructions (Signed)
We are not seeing patients in day hospital this week. You will need to go to ED if needed for treatment. If still in pain, we can admit you on Monday.

## 2016-04-06 NOTE — Progress Notes (Signed)
Patient ID: Dawn [name], female   DOB: [birth year], 35 y.o.   MRN: [id number]   Dawn [name], is a 35 y.o. female  [id number]  [id number]  DOB - Jun 13, 1981  CC:  Chief Complaint  Patient presents with  . Follow-up    scd. refill on phenergan and short acting pain med        HPI: [name] is a 35 y.o. female here for follow-up sickle cell disease. She reports currently having leg pain at 7/10. She was last treated in ED on 7/1 and was last admitted on 5/14. She has been seen in ED or admitted about 10 times this year. She is currently on short-term disability and is seeking long-term disability. She is on Hydrea, folic acid. MS contin 15 mg q 12 hours and 0xycodone 10 mg q 4 hours prn. She reports having both medications around 7 this AM. She also uses pheneran or n/v prn and takes Vitamin D supplement. She reports having a dilated eye exam about 5 months ago. She needs a urine micral. She needs refills on oxycodone and phenergan. She is needing paperwork for continued short-term disability. She denies fevers, chest pain, dysuria, n/v/d/const. Her [place] pain is usually in her legs and occasionally some back pain.  Allergies  Allergen Reactions  . Desferal [Deferoxamine] Hives    Local reaction on arm only during infusion. Can take with benadryl   . Latex Other (See Comments)    REACTION: Pt experiences a burning sensation on contacted skin areas  . Lisinopril Other (See Comments) and Cough    REACTION: Sore/scratchy throat  . Tape Other (See Comments)    REACTION: Pt. Experiences a burning sensation on contacted skin areas   Past Medical History  Diagnosis Date  . Sickle cell disease ([place])   . Sickle cell disease, type S ([place])   . Blood transfusion   . 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 HCAP (healthcare-associated pneumonia) 05/19/2013  . Sickle cell anemia (HCC)   . 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 Vitamin B12 deficiency 07/07/2015  . Pulmonary hypertension ([place])  02/06/2016    49 mmHg per echo on 02/06/16   . Demand ischemia ([place]) 02/06/2016   Current Outpatient Prescriptions on File Prior to Visit  Medication Sig Dispense Refill  . acetaminophen (TYLENOL) 500 MG tablet Take 500 mg by mouth every 6 (six) hours as needed for mild pain.     . folic acid (FOLVITE) 1 MG tablet Take 1 tablet (1 mg total) by mouth daily. 30 tablet 11  . hydroxyurea (HYDREA) 500 MG capsule Take 2 capsules (1,000 mg total) by mouth daily. May take with food to minimize GI side effects. 60 capsule 11  . ibuprofen (ADVIL,MOTRIN) 200 MG tablet Take 200 mg by mouth every 6 (six) hours as needed for mild pain or moderate pain.    Marland Kitchen levonorgestrel (MIRENA) 20 MCG/24HR IUD 1 each by Intrauterine route once.    . loratadine (CLARITIN) 10 MG tablet Take 10 mg by mouth daily.    Marland Kitchen morphine (MS CONTIN) 15 MG 12 hr tablet Take 1 tablet (15 mg total) by mouth every 12 (twelve) hours. 60 tablet 0  . Multiple Vitamin (MULTIVITAMIN WITH MINERALS) TABS tablet Take 1 tablet by mouth daily.    . Vitamin D, Ergocalciferol, (DRISDOL) 50000 UNITS CAPS capsule Take 1 capsule (50,000 Units total) by mouth every 7 (seven) days. Takes on Sundays. 4 capsule 11  . aspirin 81 MG tablet Take 1 tablet (81  mg total) by mouth daily. (Patient not taking: Reported on 03/01/2016)    . polyethylene glycol (MIRALAX / GLYCOLAX) packet Take 17 g by mouth daily as needed for mild constipation. (Patient not taking: Reported on 01/30/2016) 14 each 0   No current facility-administered medications on file prior to visit.   Family History  Problem Relation Age of Onset  . Sickle cell trait Mother   . Sickle cell trait Father   . Hypertension Father   . Diabetes Father   . Sickle cell trait Sister   . Cancer Paternal Aunt     Breast   Social History   Social History  . Marital Status: Single    Spouse Name: N/A  . Number of Children: N/A  . Years of Education: N/A   Occupational History  . Not on file.   Social  History Main Topics  . Smoking status: Never Smoker   . Smokeless tobacco: Never Used  . Alcohol Use: No  . Drug Use: No  . Sexual Activity: Yes    Birth Control/ Protection: IUD   Other Topics Concern  . Not on file   Social History Narrative    Review of Systems: Constitutional: Negative for fever, chills, appetite change, weight loss,  Fatigue. Skin: Negative for rashes or lesions of concern. HENT: Negative for ear pain, ear discharge.nose bleeds Eyes: Negative for pain, discharge, redness, itching and visual disturbance. Neck: Negative for pain, stiffness Respiratory: Negative for cough, shortness of breath,   Cardiovascular: Negative for chest pain, palpitations and leg swelling. Gastrointestinal: Negative for abdominal pain, nausea, vomiting, diarrhea, constipations Genitourinary: Negative for dysuria, urgency, frequency, hematuria,  Musculoskeletal: Negative for back pain, joint pain, joint  swelling, and gait problem.Negative for weakness. Neurological: Negative for dizziness, tremors, seizures, syncope,   light-headedness, numbness and headaches.  Hematological: Negative for easy bruising or bleeding Psychiatric/Behavioral: Negative for depression, anxiety, decreased concentration, confusion   Objective:   Filed Vitals:   04/06/16 0821  BP: 131/88  Pulse: 91  Temp: 98.5 F (36.9 C)  Resp: 16    Physical Exam: Constitutional: Patient appears well-developed and well-nourished. No distress. HENT: Normocephalic, atraumatic, External right and left ear normal. Oropharynx is clear and moist.  Eyes: Conjunctivae and EOM are normal. PERRLA, no scleral icterus. Neck: Normal ROM. Neck supple. No lymphadenopathy, No thyromegaly. CVS: RRR, S1/S2 +, no murmurs, no gallops, no rubs Pulmonary: Effort and breath sounds normal, no stridor, rhonchi, wheezes, rales.  Abdominal: Soft. Normoactive BS,, no distension, tenderness, rebound or guarding.  Musculoskeletal: Normal range  of motion. No edema and no tenderness.  Neuro: Alert.Normal muscle tone coordination. Non-focal Skin: Skin is warm and dry. No rash noted. Not diaphoretic. No erythema. No pallor. Psychiatric: Normal mood and affect. Behavior, judgment, thought content normal.  Lab Results  Component Value Date   WBC 5.8 03/24/2016   HGB 8.9* 03/24/2016   HCT 25.4* 03/24/2016   MCV 99.2 03/24/2016   PLT 316 03/24/2016   Lab Results  Component Value Date   CREATININE 0.37* 03/24/2016   BUN 6 03/24/2016   NA 135 03/24/2016   K 4.0 03/24/2016   CL 107 03/24/2016   CO2 24 03/24/2016    No results found for: HGBA1C Lipid Panel  No results found for: CHOL, TRIG, HDL, CHOLHDL, VLDL, LDLCALC     Assessment and plan:   1. 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 Sickle cell anemia with crisis (HCC) - Oxycodone HCl 10 MG TABS; Take 1 tablet (10 mg total) by mouth every 4 (four) hours as needed (breakthrough pain).  Dispense: 60 tablet; Refill: 0 -To ED for treatment if necessary -Call for admission here on Monday if still having pain.     Return in about 1 month (around 05/07/2016).  The patient was given clear instructions to go to ER or return to medical center if symptoms don't improve, worsen or new problems develop. The patient verbalized understanding.    Micheline Chapman FNP  04/06/2016, 9:47 AM

## 2016-04-06 NOTE — ED Notes (Signed)
Patient complaining of generalized body pain, worse in bilateral legs since Tuesday. States she went to her clinic today but was told to come here.

## 2016-04-06 NOTE — Discharge Instructions (Signed)
Sickle Cell Anemia, Adult Sickle cell anemia is a condition in which red blood cells have an abnormal "sickle" shape. This abnormal shape shortens the cells' life span, which results in a lower than normal concentration of red blood cells in the blood. The sickle shape also causes the cells to clump together and block free blood flow through the blood vessels. As a result, the tissues and organs of the body do not receive enough oxygen. Sickle cell anemia causes organ damage and pain and increases the risk of infection. CAUSES  Sickle cell anemia is a genetic disorder. Those who receive two copies of the gene have the condition, and those who receive one copy have the trait. RISK FACTORS The sickle cell gene is most common in people whose families originated in [place]. Other areas of the globe where sickle cell trait occurs include the [place], [place] and [place], the [place], and the [place].  SIGNS AND SYMPTOMS  Pain, especially in the extremities, back, chest, or abdomen (common). The pain may start suddenly or may develop following an illness, especially if there is dehydration. Pain can also occur due to overexertion or exposure to extreme temperature changes.  Frequent severe bacterial infections, especially certain types of pneumonia and meningitis.  Pain and swelling in the hands and feet.  Decreased activity.   Loss of appetite.   Change in behavior.  Headaches.  Seizures.  Shortness of breath or difficulty breathing.  Vision changes.  Skin ulcers. Those with the trait may not have symptoms or they may have mild symptoms.  DIAGNOSIS  Sickle cell anemia is diagnosed with blood tests that demonstrate the genetic trait. It is often diagnosed during the newborn period, due to mandatory testing nationwide. A variety of blood tests, X-rays, CT scans, MRI scans, ultrasounds, and lung function tests may also be done to monitor the condition. TREATMENT  Sickle  cell anemia may be treated with:  Medicines. You may be given pain medicines, antibiotic medicines (to treat and prevent infections) or medicines to increase the production of certain types of hemoglobin.  Fluids.  Oxygen.  Blood transfusions. HOME CARE INSTRUCTIONS   Drink enough fluid to keep your urine clear or pale yellow. Increase your fluid intake in hot weather and during exercise.  Do not smoke. Smoking lowers oxygen levels in the blood.   Only take over-the-counter or prescription medicines for pain, fever, or discomfort as directed by your health care provider.  Take antibiotics as directed by your health care provider. Make sure you finish them it even if you start to feel better.   Take supplements as directed by your health care provider.   Consider wearing a medical alert bracelet. This tells anyone caring for you in an emergency of your condition.   When traveling, keep your medical information, health care provider's names, and the medicines you take with you at all times.   If you develop a fever, do not take medicines to reduce the fever right away. This could cover up a problem that is developing. Notify your health care provider.  Keep all follow-up appointments with your health care provider. Sickle cell anemia requires regular medical care. SEEK MEDICAL CARE IF: You have a fever. SEEK IMMEDIATE MEDICAL CARE IF:   You feel dizzy or faint.   You have new abdominal pain, especially on the left side near the stomach area.   You develop a persistent, often uncomfortable and painful penile erection (priapism). If this is not treated immediately it   will lead to impotence.   You have numbness your arms or legs or you have a hard time moving them.   You have a hard time with speech.   You have a fever or persistent symptoms for more than 2-3 days.   You have a fever and your symptoms suddenly get worse.   You have signs or symptoms of infection.  These include:   Chills.   Abnormal tiredness (lethargy).   Irritability.   Poor eating.   Vomiting.   You develop pain that is not helped with medicine.   You develop shortness of breath.  You have pain in your chest.   You are coughing up pus-like or bloody sputum.   You develop a stiff neck.  Your feet or hands swell or have pain.  Your abdomen appears bloated.  You develop joint pain. MAKE SURE YOU:  Understand these instructions.   This information is not intended to replace advice given to you by your health care provider. Make sure you discuss any questions you have with your health care provider.   Document Released: 12/19/2005 Document Revised: 10/01/2014 Document Reviewed: 04/22/2013 Elsevier Interactive Patient Education 2016 [employer]  

## 2016-04-07 LAB — COMPLETE METABOLIC PANEL WITH GFR
ALBUMIN: 3.4 g/dL — AB (ref 3.6–5.1)
ALT: 49 U/L — ABNORMAL HIGH (ref 6–29)
AST: 113 U/L — AB (ref 10–30)
Alkaline Phosphatase: 173 U/L — ABNORMAL HIGH (ref 33–115)
BILIRUBIN TOTAL: 9.7 mg/dL — AB (ref 0.2–1.2)
BUN: 6 mg/dL — ABNORMAL LOW (ref 7–25)
CO2: 23 mmol/L (ref 20–31)
CREATININE: 0.65 mg/dL (ref 0.50–1.10)
Calcium: 8.4 mg/dL — ABNORMAL LOW (ref 8.6–10.2)
Chloride: 104 mmol/L (ref 98–110)
GFR, Est African American: >89 mL/min (ref >=60)
GLUCOSE: 82 mg/dL (ref 65–99)
Potassium: 3.9 mmol/L (ref 3.5–5.3)
SODIUM: 135 mmol/L (ref 135–146)
Total Protein: 7.5 g/dL (ref 6.1–8.1)

## 2016-04-07 LAB — MICROALBUMIN, URINE: MICROALB UR: 29.5 mg/dL

## 2016-04-10 ENCOUNTER — Telehealth (HOSPITAL_COMMUNITY): Payer: Self-pay | Admitting: *Deleted

## 2016-04-10 ENCOUNTER — Encounter (HOSPITAL_COMMUNITY): Payer: Self-pay | Admitting: *Deleted

## 2016-04-10 ENCOUNTER — Non-Acute Institutional Stay (HOSPITAL_COMMUNITY)
Admission: AD | Admit: 2016-04-10 | Discharge: 2016-04-10 | Disposition: A | Discharge status: Home / Self Care | Payer: BLUE CROSS/BLUE SHIELD | Source: Ambulatory Visit | Attending: Internal Medicine | Admitting: Internal Medicine

## 2016-04-10 DIAGNOSIS — Z79891 Long term (current) use of opiate analgesic: Secondary | ICD-10-CM | POA: Diagnosis not present

## 2016-04-10 DIAGNOSIS — E559 Vitamin D deficiency, unspecified: Secondary | ICD-10-CM | POA: Diagnosis not present

## 2016-04-10 DIAGNOSIS — I272 Other secondary pulmonary hypertension: Secondary | ICD-10-CM | POA: Insufficient documentation

## 2016-04-10 DIAGNOSIS — D5700 Hb-SS disease with crisis, unspecified: Secondary | ICD-10-CM | POA: Diagnosis present

## 2016-04-10 DIAGNOSIS — Z79899 Other long term (current) drug therapy: Secondary | ICD-10-CM | POA: Diagnosis not present

## 2016-04-10 MED ORDER — SENNOSIDES-DOCUSATE SODIUM 8.6-50 MG PO TABS
1.0000 | ORAL_TABLET | Freq: Two times a day (BID) | ORAL | Status: DC
  Filled 2016-04-10: qty 1

## 2016-04-10 MED ORDER — POLYETHYLENE GLYCOL 3350 17 G PO PACK
17.0000 g | PACK | Freq: Every day | ORAL | Status: DC | PRN
  Filled 2016-04-10: qty 1

## 2016-04-10 MED ORDER — NALOXONE HCL 0.4 MG/ML IJ SOLN: 0.4000 mg | INTRAMUSCULAR | Status: DC | PRN

## 2016-04-10 MED ORDER — DIPHENHYDRAMINE HCL 25 MG PO CAPS: 25.0000 mg | ORAL_CAPSULE | ORAL | Status: DC | PRN

## 2016-04-10 MED ORDER — SODIUM CHLORIDE 0.9 % IV SOLN
25.0000 mg | INTRAVENOUS | Status: DC | PRN
  Filled 2016-04-10: qty 0.5

## 2016-04-10 MED ORDER — SODIUM CHLORIDE 0.9% FLUSH: 9.0000 mL | INTRAVENOUS | Status: DC | PRN

## 2016-04-10 MED ORDER — HYDROMORPHONE HCL 2 MG/ML IJ SOLN
2.0000 mg | Freq: Once | INTRAMUSCULAR | Status: AC
  Administered 2016-04-10: 2 mg via SUBCUTANEOUS
  Filled 2016-04-10: qty 1

## 2016-04-10 MED ORDER — DEXTROSE-NACL 5-0.45 % IV SOLN
INTRAVENOUS | Status: DC
  Administered 2016-04-10: 12:00:00 via INTRAVENOUS

## 2016-04-10 MED ORDER — ONDANSETRON HCL 4 MG/2ML IJ SOLN
4.0000 mg | Freq: Four times a day (QID) | INTRAMUSCULAR | Status: DC | PRN
Start: 2016-04-10 — End: 2016-04-10

## 2016-04-10 MED ORDER — HYDROMORPHONE 1 MG/ML IV SOLN
INTRAVENOUS | Status: DC
  Administered 2016-04-10: 9.2 mg via INTRAVENOUS
  Administered 2016-04-10: 12:00:00 via INTRAVENOUS
  Filled 2016-04-10: qty 25

## 2016-04-10 MED ORDER — KETOROLAC TROMETHAMINE 30 MG/ML IJ SOLN
30.0000 mg | Freq: Four times a day (QID) | INTRAMUSCULAR | Status: DC
  Administered 2016-04-10: 30 mg via INTRAVENOUS
  Filled 2016-04-10: qty 1

## 2016-04-10 NOTE — Telephone Encounter (Signed)
Patient called requesting to come to the [facility] for treatment. Pt states she has been hurting since last week. States her pain is 8/10 and in her legs and back. Pt states she took her last pain med of MS Contin and Oxycodone. Will check with the provider and give her a call back. Pt voiced understanding.

## 2016-04-10 NOTE — H&P (Signed)
[facility] History and Physical  Dawn [name] [id number] DOB: [birth year] DOA: 04/10/2016  PCP: Angelica Chessman, MD   Chief Complaint: Sickle Cell Pain x 1 week  HPI: Dawn [name] is a 35 y.o. female with history of sickle cell disease presented to the day hospital today with complaints of pain in her legs and lower back for about a week. She has been taking her MS Contin and Oxycodone as prescribed, took them this morning but no sustained relief. She rates her pain at 8/10, constant and sharp. She has no fever or chills, denies chest pain, no cough or SOB. No N/V/D. No abdominal pain, no urinary symptom. She is on Hydrea, folic acid. MS contin 15 mg q 12 hours and 0xycodone 10 mg q 4 hours prn.  Systemic Review: General: The patient denies anorexia, fever, weight loss Cardiac: Denies chest pain, syncope, palpitations, pedal edema  Respiratory: Denies cough, shortness of breath, wheezing GI: Denies severe indigestion/heartburn, abdominal pain, nausea, vomiting, diarrhea and constipation GU: Denies hematuria, incontinence, dysuria  Musculoskeletal: Denies arthritis  Skin: Denies suspicious skin lesions Neurologic: Denies focal weakness or numbness, change in vision  Past Medical History  Diagnosis Date  . Sickle cell disease ([place])   . Sickle cell disease, type S ([place])   . Blood transfusion   . 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 HCAP (healthcare-associated pneumonia) 05/19/2013  . Sickle cell anemia (HCC)   . 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 Vitamin B12 deficiency 07/07/2015  . Pulmonary hypertension ([place]) 02/06/2016    49 mmHg per echo on 02/06/16   . Demand ischemia ([place]) 02/06/2016    Past Surgical History  Procedure Laterality Date  . Cholecystectomy    .  left knee acl reconstruction    . Cesarean section      x 2  . Portacath placement    . Portacath placement Left 02/23/2013    Procedure: INSERTION PORT-A-CATH;  Surgeon: Donato Heinz, MD;   Location: [facility];  Service: General;  Laterality: Left;  . Port-a-cath removal Left 07/29/2013    Procedure: REMOVAL PORT-A-CATH;  Surgeon: Scherry Ran, MD;  Location: [facility];  Service: General;  Laterality: Left;  . Tee without cardioversion N/A 08/07/2013    Procedure: TRANSESOPHAGEAL ECHOCARDIOGRAM (TEE);  Surgeon: Arnoldo Lenis, MD;  Location: [facility];  Service: Cardiology;  Laterality: N/A;    Allergies  Allergen Reactions  . Desferal [Deferoxamine] Hives    Local reaction on arm only during infusion. Can take with benadryl   . Latex Other (See Comments)    REACTION: Pt experiences a burning sensation on contacted skin areas  . Lisinopril Other (See Comments) and Cough    REACTION: Sore/scratchy throat  . Tape Other (See Comments)    REACTION: Pt. Experiences a burning sensation on contacted skin areas    Family History  Problem Relation Age of Onset  . Sickle cell trait Mother   . Sickle cell trait Father   . Hypertension Father   . Diabetes Father   . Sickle cell trait Sister   . Cancer Paternal Aunt     Breast      Prior to Admission medications   Medication Sig Start Date End Date Taking? Authorizing Provider  acetaminophen (TYLENOL) 500 MG tablet Take 500 mg by mouth every 6 (six) hours as needed for mild pain.    Yes Historical Provider, MD  folic acid (FOLVITE) 1 MG tablet Take 1 tablet (1 mg total)  by mouth daily. 03/14/16  Yes [name], FNP  hydroxyurea (HYDREA) 500 MG capsule Take 2 capsules (1,000 mg total) by mouth daily. May take with food to minimize GI side effects. 03/14/16  Yes [name], FNP  ibuprofen (ADVIL,MOTRIN) 200 MG tablet Take 200 mg by mouth every 6 (six) hours as needed for mild pain or moderate pain.   Yes Historical Provider, MD  loratadine (CLARITIN) 10 MG tablet Take 10 mg by mouth daily.   Yes Historical Provider, MD  morphine (MS CONTIN) 15 MG 12 hr tablet Take 1 tablet (15 mg total) by mouth every 12 (twelve)  hours. 03/01/16  Yes [name], FNP  Multiple Vitamin (MULTIVITAMIN WITH MINERALS) TABS tablet Take 1 tablet by mouth daily.   Yes Historical Provider, MD  Oxycodone HCl 10 MG TABS Take 1 tablet (10 mg total) by mouth every 4 (four) hours as needed (breakthrough pain). 04/06/16  Yes Micheline Chapman, NP  promethazine (PHENERGAN) 25 MG tablet Take 1 tablet (25 mg total) by mouth every 6 (six) hours as needed for nausea or vomiting. 04/06/16  Yes Micheline Chapman, NP  Vitamin D, Ergocalciferol, (DRISDOL) 50000 UNITS CAPS capsule Take 1 capsule (50,000 Units total) by mouth every 7 (seven) days. Takes on Sundays. 08/16/15  Yes [name], FNP  levonorgestrel (MIRENA) 20 MCG/24HR IUD 1 each by Intrauterine route once.    Historical Provider, MD     Physical Exam: Filed Vitals:   04/10/16 0938  BP: 127/82  Pulse: 90  Temp: 98.9 F (37.2 C)  TempSrc: Oral  Resp: 16  Height: 5\' 3"  (1.6 m)  Weight: 167 lb (75.751 kg)  SpO2: 97%    General: Alert, awake, afebrile, icteric ++, not in obvious distress HEENT: Normocephalic and Atraumatic, Mucous membranes pink                PERRLA; EOM intact; No scleral icterus,                 Nares: Patent, Oropharynx: Clear, Fair Dentition                 Neck: FROM, no cervical lymphadenopathy, thyromegaly, carotid bruit or JVD;  CHEST WALL: No tenderness  CHEST: Normal respiration, clear to auscultation bilaterally  HEART: Regular rate and rhythm; no murmurs rubs or gallops  BACK: No kyphosis or scoliosis; no CVA tenderness  ABDOMEN: Positive Bowel Sounds, soft, non-tender; no masses, no organomegaly EXTREMITIES: No cyanosis, clubbing, or edema SKIN:  no rash or ulceration  CNS: Alert and Oriented x 4, Nonfocal exam, CN 2-12 intact  Labs on Admission:  Basic Metabolic Panel:  Recent Labs Lab 04/06/16 0859 04/06/16 1450  NA 135 135  K 3.9 3.7  CL 104 110  CO2 23 20*  GLUCOSE 82 107*  BUN 6* 7  CREATININE 0.65 0.44  CALCIUM 8.4*  8.0*   Liver Function Tests:  Recent Labs Lab 04/06/16 0859 04/06/16 1450  AST 113* 113*  ALT 49* 52  ALKPHOS 173* 151*  BILITOT 9.7* 9.4*  PROT 7.5 7.5  ALBUMIN 3.4* 2.9*   No results for input(s): LIPASE, AMYLASE in the last 168 hours. No results for input(s): AMMONIA in the last 168 hours. CBC:  Recent Labs Lab 04/06/16 0859 04/06/16 1243  WBC 6.6 6.3  NEUTROABS 3498 3.2  HGB 8.3* 7.5*  HCT 24.3* 21.5*  MCV 100.8* 97.7  PLT 175 130*   Cardiac Enzymes: No results for input(s): CKTOTAL, CKMB,  CKMBINDEX, TROPONINI in the last 168 hours.  BNP (last 3 results) No results for input(s): BNP in the last 8760 hours.  ProBNP (last 3 results) No results for input(s): PROBNP in the last 8760 hours.  CBG: No results for input(s): GLUCAP in the last 168 hours.   Assessment/Plan Active Problems:   Sickle cell anemia with pain ([place])   Admits to the Day Hospital  IVF D5 .45% Saline @ 125 mls/hour  Weight based Dilaudid PCA started within 30 minutes of admission  IV Toradol 30 mg Q 6 H  Monitor vitals very closely, Re-evaluate pain scale every hour  2 L of Oxygen by [place]  Patient will be re-evaluated for pain in the context of function and relationship to baseline as care progresses.  If no significant relieve from pain (remains above 5/10) will transfer patient to inpatient services for further evaluation and management  Code Status: Full  Family Communication: None  DVT Prophylaxis: Ambulate as tolerated   Time spent: 20 Minutes  Dawn Sebastiani, MD, MHA, FACP, FAAP, CPE  If 7PM-7AM, please contact night-coverage [email or web address] 04/10/2016, 9:48 AM

## 2016-04-10 NOTE — Progress Notes (Signed)
IV infiltrated in right forearm. IV dc'd and pressure held. Cold compress held to the site. Site without redness. Pt. Requesting one last dose of pain medicine. Dr. Doreene Burke notified.

## 2016-04-10 NOTE — Discharge Summary (Signed)
Physician Discharge Summary  Dawn [name] [id number] DOB: 01-11-1981 DOA: 04/10/2016  PCP: Angelica Chessman, MD  Admit date: 04/10/2016  Discharge date: 04/10/2016  Time spent: 30 minutes  Discharge Diagnoses:  Active Problems:   Sickle cell anemia with pain [facility])   Discharge Condition: Stable  Diet recommendation: Regular  Filed Weights   04/10/16 0938  Weight: 167 lb (75.751 kg)    History of present illness:  Dawn [name] is a 35 y.o. female with history of sickle cell disease presented to the day hospital today with complaints of pain in her legs and lower back for about a week. She has been taking her MS Contin and Oxycodone as prescribed, took them this morning but no sustained relief. She rates her pain at 8/10, constant and sharp. She has no fever or chills, denies chest pain, no cough or SOB. No N/V/D. No abdominal pain, no urinary symptom. She is on Hydrea, folic acid. MS contin 15 mg q 12 hours and 0xycodone 10 mg q 4 hours prn.  Hospital Course:  Dawn [name] was admitted to the day hospital with sickle cell painful crisis. Patient was treated with weight based IV Dilaudid PCA, IV Toradol as well as IV fluids. Dawn [name] showed significant improvement symptomatically, pain improved from 8 to 5 out of 10 at the time of discharge. Dawn [name] will follow-up at the clinic as previously scheduled, continue with home medications as per prior to admission.  Discharge Exam: Filed Vitals:   04/10/16 1538 04/10/16 1617  BP: 137/89   Pulse: 83   Temp:    Resp: 11 13   General appearance: alert, cooperative and no distress Eyes: conjunctivae/corneas clear. PERRL, EOM's intact. Fundi benign. Neck: no adenopathy, no carotid bruit, no JVD, supple, symmetrical, trachea midline and thyroid not enlarged, symmetric, no tenderness/mass/nodules Back: symmetric, no curvature. ROM normal. No CVA tenderness. Resp: clear to auscultation bilaterally Chest wall: no tenderness Cardio: regular rate and  rhythm, S1, S2 normal, no murmur, click, rub or gallop GI: soft, non-tender; bowel sounds normal; no masses, no organomegaly Extremities: extremities normal, atraumatic, no cyanosis or edema Pulses: 2+ and symmetric Skin: Skin color, texture, turgor normal. No rashes or lesions Neurologic: Grossly normal  Discharge Instructions We discussed the need for good hydration, monitoring of hydration status, avoidance of heat, cold, stress, and infection triggers. We discussed the need to be compliant with taking Hydrea. Dawn [name] was reminded of the need to seek medical attention of any symptoms of bleeding, anemia, or infection occurs.  Current Discharge Medication List    CONTINUE these medications which have NOT CHANGED   Details  acetaminophen (TYLENOL) 500 MG tablet Take 500 mg by mouth every 6 (six) hours as needed for mild pain.     folic acid (FOLVITE) 1 MG tablet Take 1 tablet (1 mg total) by mouth daily. 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 May take with food to minimize GI side effects. Qty: 60 capsule, Refills: 11   Associated Diagnoses: Hb-SS disease without crisis (HCC)    ibuprofen (ADVIL,MOTRIN) 200 MG tablet Take 200 mg by mouth every 6 (six) hours as needed for mild pain or moderate pain.    loratadine (CLARITIN) 10 MG tablet Take 10 mg by mouth daily.    morphine (MS CONTIN) 15 MG 12 hr tablet Take 1 tablet (15 mg total) by mouth  every 12 (twelve) hours. Qty: 60 tablet, Refills: 0   Associated Diagnoses: Hb-SS disease without crisis ([place])    Multiple Vitamin (MULTIVITAMIN WITH MINERALS) TABS tablet Take 1 tablet by mouth daily.    Oxycodone HCl 10 MG TABS Take 1 tablet (10 mg total) by mouth every 4 (four) hours as needed (breakthrough pain). Qty: 60 tablet, Refills: 0   Associated Diagnoses: Hb-SS disease without crisis (HCC)     promethazine (PHENERGAN) 25 MG tablet Take 1 tablet (25 mg total) by mouth every 6 (six) hours as needed for nausea or vomiting. Qty: 20 tablet, Refills: 0    Vitamin D, Ergocalciferol, (DRISDOL) 50000 UNITS CAPS capsule Take 1 capsule (50,000 Units total) by mouth every 7 (seven) days. Takes on Sundays. Qty: 4 capsule, Refills: 11   Associated Diagnoses: Vitamin D deficiency    levonorgestrel (MIRENA) 20 MCG/24HR IUD 1 each by Intrauterine route once.       Allergies  Allergen Reactions  . Desferal [Deferoxamine] Hives    Local reaction on arm only during infusion. Can take with benadryl   . Latex Other (See Comments)    REACTION: Pt experiences a burning sensation on contacted skin areas  . Lisinopril Other (See Comments) and Cough    REACTION: Sore/scratchy throat  . Tape Other (See Comments)    REACTION: Pt. Experiences a burning sensation on contacted skin areas    Signed:  Angelica Chessman MD, [place], [place], [place], CPE   04/10/2016, 4:38 PM

## 2016-04-10 NOTE — Progress Notes (Signed)
Admitted to [facility] with complaints of 8/10 back pain. Treated with IV fluids via PIV that IV team started due to difficult IV stick, PCA and SQ Dilaudid, IV Toradol. Pain level down to 4/5 at time of discharge, which was her goal. Martin Majestic over discharge instructions and copy given to patient. Alert, oriented and ambulatory at time of discharge. Her sister is driving her home.

## 2016-04-10 NOTE — Discharge Instructions (Signed)
Sickle Cell Anemia, Adult Sickle cell anemia is a condition in which red blood cells have an abnormal "sickle" shape. This abnormal shape shortens the cells' life span, which results in a lower than normal concentration of red blood cells in the blood. The sickle shape also causes the cells to clump together and block free blood flow through the blood vessels. As a result, the tissues and organs of the body do not receive enough oxygen. Sickle cell anemia causes organ damage and pain and increases the risk of infection. CAUSES  Sickle cell anemia is a genetic disorder. Those who receive two copies of the gene have the condition, and those who receive one copy have the trait. RISK FACTORS The sickle cell gene is most common in people whose families originated in [place]. Other areas of the globe where sickle cell trait occurs include the [place], [place] and [place], the [place], and the [place].  SIGNS AND SYMPTOMS  Pain, especially in the extremities, back, chest, or abdomen (common). The pain may start suddenly or may develop following an illness, especially if there is dehydration. Pain can also occur due to overexertion or exposure to extreme temperature changes.  Frequent severe bacterial infections, especially certain types of pneumonia and meningitis.  Pain and swelling in the hands and feet.  Decreased activity.   Loss of appetite.   Change in behavior.  Headaches.  Seizures.  Shortness of breath or difficulty breathing.  Vision changes.  Skin ulcers. Those with the trait may not have symptoms or they may have mild symptoms.  DIAGNOSIS  Sickle cell anemia is diagnosed with blood tests that demonstrate the genetic trait. It is often diagnosed during the newborn period, due to mandatory testing nationwide. A variety of blood tests, X-rays, CT scans, MRI scans, ultrasounds, and lung function tests may also be done to monitor the condition. TREATMENT  Sickle  cell anemia may be treated with:  Medicines. You may be given pain medicines, antibiotic medicines (to treat and prevent infections) or medicines to increase the production of certain types of hemoglobin.  Fluids.  Oxygen.  Blood transfusions. HOME CARE INSTRUCTIONS   Drink enough fluid to keep your urine clear or pale yellow. Increase your fluid intake in hot weather and during exercise.  Do not smoke. Smoking lowers oxygen levels in the blood.   Only take over-the-counter or prescription medicines for pain, fever, or discomfort as directed by your health care provider.  Take antibiotics as directed by your health care provider. Make sure you finish them it even if you start to feel better.   Take supplements as directed by your health care provider.   Consider wearing a medical alert bracelet. This tells anyone caring for you in an emergency of your condition.   When traveling, keep your medical information, health care provider's names, and the medicines you take with you at all times.   If you develop a fever, do not take medicines to reduce the fever right away. This could cover up a problem that is developing. Notify your health care provider.  Keep all follow-up appointments with your health care provider. Sickle cell anemia requires regular medical care. SEEK MEDICAL CARE IF: You have a fever. SEEK IMMEDIATE MEDICAL CARE IF:   You feel dizzy or faint.   You have new abdominal pain, especially on the left side near the stomach area.   You develop a persistent, often uncomfortable and painful penile erection (priapism). If this is not treated immediately it   will lead to impotence.   You have numbness your arms or legs or you have a hard time moving them.   You have a hard time with speech.   You have a fever or persistent symptoms for more than 2-3 days.   You have a fever and your symptoms suddenly get worse.   You have signs or symptoms of infection.  These include:   Chills.   Abnormal tiredness (lethargy).   Irritability.   Poor eating.   Vomiting.   You develop pain that is not helped with medicine.   You develop shortness of breath.  You have pain in your chest.   You are coughing up pus-like or bloody sputum.   You develop a stiff neck.  Your feet or hands swell or have pain.  Your abdomen appears bloated.  You develop joint pain. MAKE SURE YOU:  Understand these instructions.   This information is not intended to replace advice given to you by your health care provider. Make sure you discuss any questions you have with your health care provider.   Document Released: 12/19/2005 Document Revised: 10/01/2014 Document Reviewed: 04/22/2013 Elsevier Interactive Patient Education 2016 [employer]  

## 2016-04-10 NOTE — Telephone Encounter (Signed)
Called patient after speaking with Dr. Doreene Burke to let her know she can come for treatment. Pt voiced understanding and that she can get here in about 25 mins.

## 2016-04-23 ENCOUNTER — Encounter (HOSPITAL_COMMUNITY): Payer: Self-pay | Admitting: Emergency Medicine

## 2016-04-23 ENCOUNTER — Emergency Department (HOSPITAL_COMMUNITY)
Admission: EM | Admit: 2016-04-23 | Discharge: 2016-04-24 | Disposition: A | Discharge status: Home / Self Care | Payer: BLUE CROSS/BLUE SHIELD | Attending: Emergency Medicine | Admitting: Emergency Medicine

## 2016-04-23 DIAGNOSIS — Z79899 Other long term (current) drug therapy: Secondary | ICD-10-CM | POA: Insufficient documentation

## 2016-04-23 DIAGNOSIS — Z91040 Latex allergy status: Secondary | ICD-10-CM | POA: Diagnosis not present

## 2016-04-23 DIAGNOSIS — M549 Dorsalgia, unspecified: Secondary | ICD-10-CM | POA: Diagnosis present

## 2016-04-23 DIAGNOSIS — Z791 Long term (current) use of non-steroidal anti-inflammatories (NSAID): Secondary | ICD-10-CM | POA: Diagnosis not present

## 2016-04-23 DIAGNOSIS — D5700 Hb-SS disease with crisis, unspecified: Secondary | ICD-10-CM | POA: Insufficient documentation

## 2016-04-23 LAB — URINALYSIS, ROUTINE W REFLEX MICROSCOPIC
GLUCOSE, UA: NEGATIVE mg/dL
KETONES UR: NEGATIVE mg/dL
Leukocytes, UA: NEGATIVE
Nitrite: NEGATIVE
PH: 5.5 (ref 5.0–8.0)
PROTEIN: 30 mg/dL — AB
Specific Gravity, Urine: 1.015 (ref 1.005–1.030)

## 2016-04-23 LAB — RETICULOCYTES
RBC.: 2.11 MIL/uL — AB (ref 3.87–5.11)
RETIC COUNT ABSOLUTE: 510.6 10*3/uL — AB (ref 19.0–186.0)
RETIC CT PCT: 24.2 % — AB (ref 0.4–3.1)

## 2016-04-23 LAB — COMPREHENSIVE METABOLIC PANEL
ALBUMIN: 2.9 g/dL — AB (ref 3.5–5.0)
ALK PHOS: 122 U/L (ref 38–126)
ALT: 43 U/L (ref 14–54)
ANION GAP: 4 — AB (ref 5–15)
AST: 108 U/L — ABNORMAL HIGH (ref 15–41)
BILIRUBIN TOTAL: 13.6 mg/dL — AB (ref 0.3–1.2)
BUN: 6 mg/dL (ref 6–20)
CALCIUM: 8.5 mg/dL — AB (ref 8.9–10.3)
CO2: 22 mmol/L (ref 22–32)
Chloride: 106 mmol/L (ref 101–111)
Creatinine, Ser: 0.42 mg/dL — ABNORMAL LOW (ref 0.44–1.00)
GFR calc non Af Amer: >60 mL/min (ref >60)
GLUCOSE: 81 mg/dL (ref 65–99)
POTASSIUM: 3.8 mmol/L (ref 3.5–5.1)
SODIUM: 132 mmol/L — AB (ref 135–145)
TOTAL PROTEIN: 7.8 g/dL (ref 6.5–8.1)

## 2016-04-23 LAB — URINE MICROSCOPIC-ADD ON

## 2016-04-23 LAB — CBC WITH DIFFERENTIAL/PLATELET
BASOS PCT: 0 %
Basophils Absolute: 0 10*3/uL (ref 0.0–0.1)
EOS ABS: 0.1 10*3/uL (ref 0.0–0.7)
EOS PCT: 1 %
HEMATOCRIT: 20.4 % — AB (ref 36.0–46.0)
HEMOGLOBIN: 7.6 g/dL — AB (ref 12.0–15.0)
LYMPHS ABS: 3.1 10*3/uL (ref 0.7–4.0)
Lymphocytes Relative: 33 %
MCH: 36 pg — AB (ref 26.0–34.0)
MCHC: 37.3 g/dL — AB (ref 30.0–36.0)
MCV: 96.7 fL (ref 78.0–100.0)
Monocytes Absolute: 1.5 10*3/uL — ABNORMAL HIGH (ref 0.1–1.0)
Monocytes Relative: 16 %
NEUTROS PCT: 50 %
Neutro Abs: 4.5 10*3/uL (ref 1.7–7.7)
Platelets: 216 10*3/uL (ref 150–400)
RBC: 2.11 MIL/uL — AB (ref 3.87–5.11)
RDW: 21.5 % — AB (ref 11.5–15.5)
WBC: 9.2 10*3/uL (ref 4.0–10.5)

## 2016-04-23 LAB — PREGNANCY, URINE: Preg Test, Ur: NEGATIVE

## 2016-04-23 MED ORDER — HYDROMORPHONE HCL 2 MG/ML IJ SOLN
0.0313 mg/kg | INTRAMUSCULAR | Status: AC | PRN
  Administered 2016-04-23: 2.4 mg via INTRAVENOUS
  Filled 2016-04-23: qty 2

## 2016-04-23 MED ORDER — ONDANSETRON HCL 4 MG/2ML IJ SOLN
4.0000 mg | INTRAMUSCULAR | Status: DC | PRN
  Administered 2016-04-23: 4 mg via INTRAVENOUS
  Filled 2016-04-23: qty 2

## 2016-04-23 MED ORDER — DIPHENHYDRAMINE HCL 25 MG PO CAPS
25.0000 mg | ORAL_CAPSULE | ORAL | Status: DC | PRN
  Administered 2016-04-23: 25 mg via ORAL
  Filled 2016-04-23: qty 1

## 2016-04-23 MED ORDER — HYDROMORPHONE HCL 2 MG/ML IJ SOLN
0.0375 mg/kg | INTRAMUSCULAR | Status: AC | PRN
  Administered 2016-04-23: 2.8 mg via INTRAVENOUS
  Filled 2016-04-23: qty 2

## 2016-04-23 MED ORDER — KETOROLAC TROMETHAMINE 30 MG/ML IJ SOLN
30.0000 mg | INTRAMUSCULAR | Status: AC
  Administered 2016-04-23: 30 mg via INTRAVENOUS
  Filled 2016-04-23: qty 1

## 2016-04-23 MED ORDER — HYDROMORPHONE HCL 2 MG/ML IJ SOLN: 0.0250 mg/kg | INTRAMUSCULAR | Status: AC

## 2016-04-23 MED ORDER — HYDROMORPHONE HCL 2 MG/ML IJ SOLN
0.0250 mg/kg | INTRAMUSCULAR | Status: AC
  Administered 2016-04-23: 1.9 mg via INTRAVENOUS
  Filled 2016-04-23: qty 1

## 2016-04-23 MED ORDER — HYDROMORPHONE HCL 2 MG/ML IJ SOLN: 0.0313 mg/kg | INTRAMUSCULAR | Status: AC | PRN

## 2016-04-23 MED ORDER — HYDROMORPHONE HCL 2 MG/ML IJ SOLN: 0.0375 mg/kg | INTRAMUSCULAR | Status: AC | PRN

## 2016-04-23 NOTE — Discharge Instructions (Signed)
Take your usual prescriptions as previously directed.  Call your regular medical doctor tomorrow to schedule a follow up appointment within the next 2 days.  Return to the Emergency Department immediately sooner if worsening.  ° °

## 2016-04-23 NOTE — ED Triage Notes (Signed)
Pt having sickle cell crisis, not relieved by pain meds at home. Pain in back and legs

## 2016-04-23 NOTE — ED Provider Notes (Signed)
[facility] DEPT Provider Note   CSN: [id number] Arrival date & time: 04/23/16  1849  First Provider Contact:  None       History   Chief Complaint Chief Complaint  Patient presents with  . Sickle Cell Pain Crisis    HPI Dawn [name] is a 35 y.o. female.  HPI  Pt was seen at 2055.  Per pt, c/o gradual onset and persistence of constant acute flair of her chronic sickle cell pain crisis since early this morning at 0200. Describes the pain as located in her legs and back, per her usual chronic sickle cell pain pattern. Denies any other symptoms. Denies fevers, no rash, no CP/SOB, no abd pain, no N/V/D, no focal motor weakness, no tingling/numbness in extremities.    Past Medical History:  Diagnosis Date  . Blood transfusion   . Demand ischemia ([place]) 02/06/2016  . HCAP (healthcare-associated pneumonia) 05/19/2013  . 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 Pulmonary hypertension ([place]) 02/06/2016   49 mmHg per echo on 02/06/16   . 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 Sickle cell anemia (HCC)   . Sickle cell disease ([place])   . 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 Vitamin B12 deficiency 07/07/2015    Patient Active Problem List   Diagnosis Date Noted  . Sickle cell anemia with pain ([place]) 04/10/2016  . Pulmonary hypertension ([place]) 02/16/2016  . 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 Pyrexia   . Sickle-cell/Hb-C disease with crisis ([place]) 02/13/2016  . Anemia 02/13/2016  . Demand ischemia ([place]) 02/13/2016  . Sepsis ([place]) 02/13/2016  . Fever 02/05/2016  . Tachycardia   . Anemia of chronic disease   . Chronic pain syndrome 10/14/2015  . UTI (urinary tract infection) 08/25/2015  . 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 Vitamin B12 deficiency 07/07/2015  . Acute pulmonary edema ([place]) 07/06/2015  . Thrombocytopenia ([place]) 07/06/2015  . Acute respiratory failure with hypoxia ([place]) 09/12/2014  . Hyperbilirubinemia 09/11/2014  . Sickle cell disease with crisis ([place])  09/09/2014  . Anxiety state 06/15/2014  . Nosebleed 06/15/2014  . Macrocytosis 06/13/2014  . Sickle cell crisis ([place]) 06/12/2014  . Sickle cell anemia with crisis ([place]) 05/24/2014  . Vitamin D deficiency 11/23/2013  . Hb-SS disease without crisis ([place]) 11/23/2013  . Abnormal chest CT 08/07/2013  . Chest pain 08/03/2013  . Fungemia 07/28/2013  . Fever, unspecified 07/27/2013  . Hb-SS disease with acute chest syndrome ([place]) 05/19/2013  . Hypoxemia 05/14/2013  . 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 Hypokalemia 03/28/2012  . Hypoalbuminemia 03/28/2012  . 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 Leukocytosis 05/26/2011  . Elevated LFTs 04/27/2011  . Sickle cell anemia ([place]) 04/27/2011  . Sickle cell disease ([place]) 04/26/2011    Past Surgical History:  Procedure Laterality Date  .  left knee ACL reconstruction    . CESAREAN SECTION     x 2  . CHOLECYSTECTOMY    . PORT-A-CATH REMOVAL Left 07/29/2013   Procedure: REMOVAL PORT-A-CATH;  Surgeon: Scherry Ran, MD;  Location: [facility];  Service: General;  Laterality: Left;  . PORTACATH PLACEMENT    . PORTACATH PLACEMENT Left 02/23/2013   Procedure: INSERTION PORT-A-CATH;  Surgeon: Donato Heinz, MD;  Location: [facility] ORS;  Service: General;  Laterality: Left;  . TEE WITHOUT CARDIOVERSION N/A 08/07/2013   Procedure: TRANSESOPHAGEAL ECHOCARDIOGRAM (TEE);  Surgeon: Arnoldo Lenis, MD;  Location: [facility];  Service: Cardiology;  Laterality: N/A;    OB History    Gravida  Para Term Preterm AB Living   1 1   1   1    SAB TAB Ectopic Multiple Live Births                   Home Medications    Prior to Admission medications   Medication Sig Start Date End Date Taking? Authorizing Provider  acetaminophen (TYLENOL) 500 MG tablet Take 500 mg by mouth every 6 (six) hours as needed for mild pain.    Yes Historical Provider, MD  folic acid (FOLVITE) 1 MG tablet Take 1 tablet (1 mg total) by mouth daily. 03/14/16   Yes [name], FNP  hydroxyurea (HYDREA) 500 MG capsule Take 2 capsules (1,000 mg total) by mouth daily. May take with food to minimize GI side effects. 03/14/16  Yes [name], FNP  ibuprofen (ADVIL,MOTRIN) 200 MG tablet Take 200 mg by mouth every 6 (six) hours as needed for mild pain or moderate pain.   Yes Historical Provider, MD  levonorgestrel (MIRENA) 20 MCG/24HR IUD 1 each by Intrauterine route once.   Yes Historical Provider, MD  loratadine (CLARITIN) 10 MG tablet Take 10 mg by mouth daily.   Yes Historical Provider, MD  morphine (MS CONTIN) 15 MG 12 hr tablet Take 1 tablet (15 mg total) by mouth every 12 (twelve) hours. 03/01/16  Yes [name], FNP  Multiple Vitamin (MULTIVITAMIN WITH MINERALS) TABS tablet Take 1 tablet by mouth daily.   Yes Historical Provider, MD  Oxycodone HCl 10 MG TABS Take 1 tablet (10 mg total) by mouth every 4 (four) hours as needed (breakthrough pain). 04/06/16  Yes Micheline Chapman, NP  promethazine (PHENERGAN) 25 MG tablet Take 1 tablet (25 mg total) by mouth every 6 (six) hours as needed for nausea or vomiting. 04/06/16  Yes Micheline Chapman, NP  Vitamin D, Ergocalciferol, (DRISDOL) 50000 UNITS CAPS capsule Take 1 capsule (50,000 Units total) by mouth every 7 (seven) days. Takes on Sundays. 08/16/15  Yes [name], FNP    Family History Family History  Problem Relation Age of Onset  . Sickle cell trait Mother   . Sickle cell trait Father   . Hypertension Father   . Diabetes Father   . Sickle cell trait Sister   . Cancer Paternal Aunt     Breast    Social History Social History  Substance Use Topics  . Smoking status: Never Smoker  . Smokeless tobacco: Never Used  . Alcohol use No     Allergies   Desferal [deferoxamine]; Latex; Lisinopril; and Tape   Review of Systems Review of Systems ROS: Statement: All systems negative except as marked or noted in the HPI; Constitutional: Negative for fever and chills. ; ; Eyes:  Negative for eye pain, redness and discharge. ; ; ENMT: Negative for ear pain, hoarseness, nasal congestion, sinus pressure and sore throat. ; ; Cardiovascular: Negative for chest pain, palpitations, diaphoresis, dyspnea and peripheral edema. ; ; Respiratory: Negative for cough, wheezing and stridor. ; ; Gastrointestinal: Negative for nausea, vomiting, diarrhea, abdominal pain, blood in stool, hematemesis, jaundice and rectal bleeding. . ; ; Genitourinary: Negative for dysuria, flank pain and hematuria. ; ; Musculoskeletal: +back pain, legs pain. Negative for neck pain. Negative for swelling and trauma.; ; Skin: Negative for pruritus, rash, abrasions, blisters, bruising and skin lesion.; ; Neuro: Negative for headache, lightheadedness and neck stiffness. Negative for weakness, altered level of consciousness, altered mental status, extremity weakness, paresthesias, involuntary movement, seizure and  syncope.       Physical Exam Updated Vital Signs BP 132/78   Pulse 88   Temp 98.9 F (37.2 C) (Oral)   Resp 21   Ht 5\' 3"  (1.6 m)   Wt 167 lb (75.8 kg)   SpO2 93%   BMI 29.58 kg/m   Physical Exam 2100: Physical examination:  Nursing notes reviewed; Vital signs and O2 SAT reviewed;  Constitutional: Well developed, Well nourished, Well hydrated, Uncomfortable appearing.; Head:  Normocephalic, atraumatic; Eyes: EOMI, PERRL, No scleral icterus; ENMT: Mouth and pharynx normal, Mucous membranes moist; Neck: Supple, Full range of motion, No lymphadenopathy; Cardiovascular: Regular rate and rhythm, No gallop; Respiratory: Breath sounds clear & equal bilaterally, No wheezes.  Speaking full sentences with ease, Normal respiratory effort/excursion; Chest: Nontender, Movement normal; Abdomen: Soft, Nontender, Nondistended, Normal bowel sounds; Genitourinary: No CVA tenderness; Spine:  No midline CS, TS, LS tenderness.;; Extremities: Pulses normal, No tenderness, No edema, No calf edema or asymmetry.; Neuro: AA&Ox3,  Major CN grossly intact.  Speech clear. No gross focal motor or sensory deficits in extremities.; Skin: Color normal, Warm, Dry.    ED Treatments / Results  Labs (all labs ordered are listed, but only abnormal results are displayed) Labs Reviewed  CBC WITH DIFFERENTIAL/PLATELET - Abnormal; Notable for the following:       Result Value   RBC 2.11 (*)    Hemoglobin 7.6 (*)    HCT 20.4 (*)    MCH 36.0 (*)    MCHC 37.3 (*)    RDW 21.5 (*)    Monocytes Absolute 1.5 (*)    All other components within normal limits  COMPREHENSIVE METABOLIC PANEL - Abnormal; Notable for the following:    Sodium 132 (*)    Creatinine, Ser 0.42 (*)    Calcium 8.5 (*)    Albumin 2.9 (*)    AST 108 (*)    Total Bilirubin 13.6 (*)    Anion gap 4 (*)    All other components within normal limits  RETICULOCYTES - Abnormal; Notable for the following:    Retic Ct Pct 24.2 (*)    RBC. 2.11 (*)    Retic Count, Manual 510.6 (*)    All other components within normal limits  URINALYSIS, ROUTINE W REFLEX MICROSCOPIC (NOT AT [facility]) - Abnormal; Notable for the following:    Color, Urine AMBER (*)    Hgb urine dipstick MODERATE (*)    Bilirubin Urine MODERATE (*)    Protein, ur 30 (*)    All other components within normal limits  URINE MICROSCOPIC-ADD ON - Abnormal; Notable for the following:    Squamous Epithelial / LPF TOO NUMEROUS TO COUNT (*)    Bacteria, UA FEW (*)    All other components within normal limits  PREGNANCY, URINE    EKG  EKG Interpretation None       Radiology   Procedures Procedures (including critical care time)  Medications Ordered in ED Medications  ondansetron (ZOFRAN) injection 4 mg (4 mg Intravenous Given 04/23/16 2132)  diphenhydrAMINE (BENADRYL) capsule 25-50 mg (25 mg Oral Given 04/23/16 2132)  ketorolac (TORADOL) 30 MG/ML injection 30 mg (30 mg Intravenous Given 04/23/16 2133)  HYDROmorphone (DILAUDID) injection 1.9 mg (1.9 mg Intravenous Given 04/23/16 2149)    Or    HYDROmorphone (DILAUDID) injection 1.9 mg ( Subcutaneous See Alternative 04/23/16 2149)  HYDROmorphone (DILAUDID) injection 2.4 mg (2.4 mg Intravenous Given 04/23/16 2244)    Or  HYDROmorphone (DILAUDID) injection 2.4 mg ( Subcutaneous See  Alternative 04/23/16 2244)  HYDROmorphone (DILAUDID) injection 2.8 mg (2.8 mg Intravenous Given 04/23/16 2315)    Or  HYDROmorphone (DILAUDID) injection 2.8 mg ( Subcutaneous See Alternative 04/23/16 2315)     Initial Impression / Assessment and Plan / ED Course  I have reviewed the triage vital signs and the nursing notes.  Pertinent labs & imaging results that were available during my care of the patient were reviewed by me and considered in my medical decision making (see chart for details).  MDM Reviewed: nursing note, vitals and previous chart Reviewed previous: labs Interpretation: labs   Results for orders placed or performed during the hospital encounter of 04/23/16  CBC WITH DIFFERENTIAL  Result Value Ref Range   WBC 9.2 4.0 - 10.5 K/uL   RBC 2.11 (L) 3.87 - 5.11 MIL/uL   Hemoglobin 7.6 (L) 12.0 - 15.0 g/dL   HCT 20.4 (L) 36.0 - 46.0 %   MCV 96.7 78.0 - 100.0 fL   MCH 36.0 (H) 26.0 - 34.0 pg   MCHC 37.3 (H) 30.0 - 36.0 g/dL   RDW 21.5 (H) 11.5 - 15.5 %   Platelets 216 150 - 400 K/uL   Neutrophils Relative % 50 %   Neutro Abs 4.5 1.7 - 7.7 K/uL   Lymphocytes Relative 33 %   Lymphs Abs 3.1 0.7 - 4.0 K/uL   Monocytes Relative 16 %   Monocytes Absolute 1.5 (H) 0.1 - 1.0 K/uL   Eosinophils Relative 1 %   Eosinophils Absolute 0.1 0.0 - 0.7 K/uL   Basophils Relative 0 %   Basophils Absolute 0.0 0.0 - 0.1 K/uL   WBC Morphology ATYPICAL LYMPHOCYTES    RBC Morphology SICKLE CELLS    Smear Review LARGE PLATELETS PRESENT   Comprehensive metabolic panel  Result Value Ref Range   Sodium 132 (L) 135 - 145 mmol/L   Potassium 3.8 3.5 - 5.1 mmol/L   Chloride 106 101 - 111 mmol/L   CO2 22 22 - 32 mmol/L   Glucose, Bld 81 65 - 99 mg/dL   BUN  6 6 - 20 mg/dL   Creatinine, Ser 0.42 (L) 0.44 - 1.00 mg/dL   Calcium 8.5 (L) 8.9 - 10.3 mg/dL   Total Protein 7.8 6.5 - 8.1 g/dL   Albumin 2.9 (L) 3.5 - 5.0 g/dL   AST 108 (H) 15 - 41 U/L   ALT 43 14 - 54 U/L   Alkaline Phosphatase 122 38 - 126 U/L   Total Bilirubin 13.6 (H) 0.3 - 1.2 mg/dL   GFR calc non Af Amer >60 >60 mL/min   GFR calc Af Amer >60 >60 mL/min   Anion gap 4 (L) 5 - 15  Urine Pregnancy  Result Value Ref Range   Preg Test, Ur NEGATIVE NEGATIVE  Reticulocytes  Result Value Ref Range   Retic Ct Pct 24.2 (H) 0.4 - 3.1 %   RBC. 2.11 (L) 3.87 - 5.11 MIL/uL   Retic Count, Manual 510.6 (H) 19.0 - 186.0 K/uL  Urinalysis, Routine w reflex microscopic (not at [facility])  Result Value Ref Range   Color, Urine AMBER (A) YELLOW   APPearance CLEAR CLEAR   Specific Gravity, Urine 1.015 1.005 - 1.030   pH 5.5 5.0 - 8.0   Glucose, UA NEGATIVE NEGATIVE mg/dL   Hgb urine dipstick MODERATE (A) NEGATIVE   Bilirubin Urine MODERATE (A) NEGATIVE   Ketones, ur NEGATIVE NEGATIVE mg/dL   Protein, ur 30 (A) NEGATIVE mg/dL   Nitrite NEGATIVE NEGATIVE  Leukocytes, UA NEGATIVE NEGATIVE  Urine microscopic-add on  Result Value Ref Range   Squamous Epithelial / LPF TOO NUMEROUS TO COUNT (A) NONE SEEN   WBC, UA 0-5 0 - 5 WBC/hpf   RBC / HPF 0-5 0 - 5 RBC/hpf   Bacteria, UA FEW (A) NONE SEEN    2340:  Labs near baseline. Pt states she feels better and wants to go home now. I offered admission several times, as she still appears mildly uncomfortable. She does not want to be admitted and wants to go home. Dx and testing d/w pt and family.  Questions answered.  Verb understanding, agreeable to d/c home with outpt f/u.    Final Clinical Impressions(s) / ED Diagnoses   Final diagnoses:  Sickle cell pain crisis [facility])    New Prescriptions New Prescriptions   No medications on file     Francine Graven, DO 04/26/16 1620

## 2016-04-27 ENCOUNTER — Telehealth (HOSPITAL_COMMUNITY): Payer: Self-pay | Admitting: Internal Medicine

## 2016-04-27 ENCOUNTER — Non-Acute Institutional Stay (HOSPITAL_COMMUNITY)
Admission: AD | Admit: 2016-04-27 | Discharge: 2016-04-27 | Disposition: A | Discharge status: Home / Self Care | Payer: BLUE CROSS/BLUE SHIELD | Source: Ambulatory Visit | Attending: Internal Medicine | Admitting: Internal Medicine

## 2016-04-27 ENCOUNTER — Encounter (HOSPITAL_COMMUNITY): Payer: Self-pay | Admitting: Hematology

## 2016-04-27 DIAGNOSIS — D5700 Hb-SS disease with crisis, unspecified: Secondary | ICD-10-CM

## 2016-04-27 DIAGNOSIS — Z79899 Other long term (current) drug therapy: Secondary | ICD-10-CM

## 2016-04-27 MED ORDER — DIPHENHYDRAMINE HCL 25 MG PO CAPS
25.0000 mg | ORAL_CAPSULE | ORAL | Status: DC | PRN
  Administered 2016-04-27: 25 mg via ORAL
  Filled 2016-04-27: qty 1

## 2016-04-27 MED ORDER — POLYETHYLENE GLYCOL 3350 17 G PO PACK
17.0000 g | PACK | Freq: Every day | ORAL | Status: DC | PRN
  Filled 2016-04-27: qty 1

## 2016-04-27 MED ORDER — NALOXONE HCL 0.4 MG/ML IJ SOLN: 0.4000 mg | INTRAMUSCULAR | Status: DC | PRN

## 2016-04-27 MED ORDER — SODIUM CHLORIDE 0.9% FLUSH: 9.0000 mL | INTRAVENOUS | Status: DC | PRN

## 2016-04-27 MED ORDER — HYDROMORPHONE HCL 2 MG/ML IJ SOLN
2.0000 mg | INTRAMUSCULAR | Status: DC
  Administered 2016-04-27 (×3): 2 mg via INTRAVENOUS
  Filled 2016-04-27 (×3): qty 1

## 2016-04-27 MED ORDER — DEXTROSE-NACL 5-0.45 % IV SOLN
INTRAVENOUS | Status: DC
  Administered 2016-04-27: 11:00:00 via INTRAVENOUS

## 2016-04-27 MED ORDER — HYDROMORPHONE 1 MG/ML IV SOLN
INTRAVENOUS | Status: DC
  Administered 2016-04-27: 16.5 mg via INTRAVENOUS
  Administered 2016-04-27: 11:00:00 via INTRAVENOUS
  Filled 2016-04-27: qty 25

## 2016-04-27 MED ORDER — ONDANSETRON HCL 4 MG/2ML IJ SOLN
4.0000 mg | Freq: Four times a day (QID) | INTRAMUSCULAR | Status: DC | PRN
  Administered 2016-04-27: 4 mg via INTRAVENOUS
  Filled 2016-04-27: qty 2

## 2016-04-27 MED ORDER — HYDROMORPHONE HCL 2 MG/ML IJ SOLN: 2.0000 mg | INTRAMUSCULAR | Status: DC

## 2016-04-27 MED ORDER — SODIUM CHLORIDE 0.9 % IV SOLN
25.0000 mg | INTRAVENOUS | Status: DC | PRN
  Filled 2016-04-27: qty 0.5

## 2016-04-27 MED ORDER — KETOROLAC TROMETHAMINE 30 MG/ML IJ SOLN
30.0000 mg | Freq: Four times a day (QID) | INTRAMUSCULAR | Status: DC
  Administered 2016-04-27: 30 mg via INTRAVENOUS
  Filled 2016-04-27: qty 1

## 2016-04-27 MED ORDER — SENNOSIDES-DOCUSATE SODIUM 8.6-50 MG PO TABS: 1.0000 | ORAL_TABLET | Freq: Two times a day (BID) | ORAL | Status: DC

## 2016-04-27 NOTE — Telephone Encounter (Signed)
Pt called and states experiencing pain in legs and back; states took home pain meds with no relief; pt denies CP, shortness of breath, nausea, vomiting, diarrhea or fever; MD notified; pt informed that she may come to [facility] for evaluation; pt verbalizes understanding

## 2016-04-27 NOTE — Progress Notes (Signed)
Discharge instructions given to patient.  IV removed without difficulty.  Patient states pain has improved to 5/10 on pain scale.  Patient verbalizes understanding for follow up.

## 2016-04-27 NOTE — H&P (Signed)
[facility] History and Physical  Dawn [name] [id number] DOB: May 17, 1981 DOA: 04/27/2016  PCP: Angelica Chessman, MD   Chief Complaint:  Chief Complaint  Patient presents with  . Sickle Cell Pain Crisis    HPI: Dawn [name] is a 35 y.o. female with history of sickle cell disease presented to the day hospital today with complaints of pain in her legs and lower back for about 3 days. She was seen at the ED on 04/23/2016 for the same. Pain is similar to her usual sickle cell crisis. She has been taking her MS Contin and Oxycodone as prescribed, took them this morning but no sustained relief. She rates her pain at 9/10, constant and sharp. She has no fever or chills, denies chest pain, no cough or SOB. No N/V/D. No abdominal pain, no urinary symptom. She is on Hydrea, folic acid. MS contin 15 mg q 12 hours and 0xycodone 10 mg q 4 hours prn.  Systemic Review: General: The patient denies anorexia, fever, weight loss Cardiac: Denies chest pain, syncope, palpitations, pedal edema  Respiratory: Denies cough, shortness of breath, wheezing GI: Denies severe indigestion/heartburn, abdominal pain, nausea, vomiting, diarrhea and constipation GU: Denies hematuria, incontinence, dysuria  Musculoskeletal: Denies arthritis  Skin: Denies suspicious skin lesions Neurologic: Denies focal weakness or numbness, change in vision  Past Medical History:  Diagnosis Date  . Blood transfusion   . Demand ischemia ([place]) 02/06/2016  . HCAP (healthcare-associated pneumonia) 05/19/2013  . 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 Pulmonary hypertension ([place]) 02/06/2016   49 mmHg per echo on 02/06/16   . 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 Sickle cell anemia (HCC)   . Sickle cell disease ([place])   . 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 Vitamin B12 deficiency 07/07/2015    Past Surgical History:  Procedure Laterality Date  .  left knee ACL reconstruction    . CESAREAN SECTION     x 2  .  CHOLECYSTECTOMY    . PORT-A-CATH REMOVAL Left 07/29/2013   Procedure: REMOVAL PORT-A-CATH;  Surgeon: Scherry Ran, MD;  Location: [facility] ORS;  Service: General;  Laterality: Left;  . PORTACATH PLACEMENT    . PORTACATH PLACEMENT Left 02/23/2013   Procedure: INSERTION PORT-A-CATH;  Surgeon: Donato Heinz, MD;  Location: [facility] ORS;  Service: General;  Laterality: Left;  . TEE WITHOUT CARDIOVERSION N/A 08/07/2013   Procedure: TRANSESOPHAGEAL ECHOCARDIOGRAM (TEE);  Surgeon: Arnoldo Lenis, MD;  Location: [facility] SU[facility];  Service: Cardiology;  Laterality: N/A;    Allergies  Allergen Reactions  . Desferal [Deferoxamine] Hives    Local reaction on arm only during infusion. Can take with benadryl   . Latex Other (See Comments)    REACTION: Pt experiences a burning sensation on contacted skin areas  . Lisinopril Other (See Comments) and Cough    REACTION: Sore/scratchy throat  . Tape Other (See Comments)    REACTION: Pt. Experiences a burning sensation on contacted skin areas    Family History  Problem Relation Age of Onset  . Sickle cell trait Mother   . Sickle cell trait Father   . Hypertension Father   . Diabetes Father   . Sickle cell trait Sister   . Cancer Paternal Aunt     Breast      Prior to Admission medications   Medication Sig Start Date End Date Taking? Authorizing Provider  acetaminophen (TYLENOL) 500 MG tablet Take 500 mg by mouth every 6 (six) hours as  needed for mild pain.    Yes Historical Provider, MD  folic acid (FOLVITE) 1 MG tablet Take 1 tablet (1 mg total) by mouth daily. 03/14/16  Yes [name], FNP  hydroxyurea (HYDREA) 500 MG capsule Take 2 capsules (1,000 mg total) by mouth daily. May take with food to minimize GI side effects. 03/14/16  Yes [name], FNP  ibuprofen (ADVIL,MOTRIN) 200 MG tablet Take 200 mg by mouth every 6 (six) hours as needed for mild pain or moderate pain.   Yes Historical Provider, MD  loratadine (CLARITIN) 10 MG tablet Take  10 mg by mouth daily.   Yes Historical Provider, MD  morphine (MS CONTIN) 15 MG 12 hr tablet Take 1 tablet (15 mg total) by mouth every 12 (twelve) hours. 03/01/16  Yes [name], FNP  Multiple Vitamin (MULTIVITAMIN WITH MINERALS) TABS tablet Take 1 tablet by mouth daily.   Yes Historical Provider, MD  Oxycodone HCl 10 MG TABS Take 1 tablet (10 mg total) by mouth every 4 (four) hours as needed (breakthrough pain). 04/06/16  Yes Micheline Chapman, NP  promethazine (PHENERGAN) 25 MG tablet Take 1 tablet (25 mg total) by mouth every 6 (six) hours as needed for nausea or vomiting. 04/06/16  Yes Micheline Chapman, NP  Vitamin D, Ergocalciferol, (DRISDOL) 50000 UNITS CAPS capsule Take 1 capsule (50,000 Units total) by mouth every 7 (seven) days. Takes on Sundays. 08/16/15  Yes [name], FNP  levonorgestrel (MIRENA) 20 MCG/24HR IUD 1 each by Intrauterine route once.    Historical Provider, MD     Physical Exam: Vitals:   04/27/16 1032 04/27/16 1103  BP: 132/89   Pulse: 96   Resp: 18 14  Temp: 98.9 F (37.2 C)   TempSrc: Oral   SpO2: 97% 100%  Weight: 167 lb (75.8 kg)   Height: 5\' 3"  (1.6 m)     General: Alert, awake, afebrile, anicteric, not in obvious distress HEENT: Normocephalic and Atraumatic, Mucous membranes pink                PERRLA; EOM intact; No scleral icterus,                 Nares: Patent, Oropharynx: Clear, Fair Dentition                 Neck: FROM, no cervical lymphadenopathy, thyromegaly, carotid bruit or JVD;  CHEST WALL: No tenderness  CHEST: Normal respiration, clear to auscultation bilaterally  HEART: Regular rate and rhythm; no murmurs rubs or gallops  BACK: No kyphosis or scoliosis; no CVA tenderness  ABDOMEN: Positive Bowel Sounds, soft, non-tender; no masses, no organomegaly EXTREMITIES: No cyanosis, clubbing, or edema SKIN:  no rash or ulceration  CNS: Alert and Oriented x 4, Nonfocal exam, CN 2-12 intact  Labs on Admission:  Basic Metabolic  Panel:  Recent Labs Lab 04/23/16 2100  NA 132*  K 3.8  CL 106  CO2 22  GLUCOSE 81  BUN 6  CREATININE 0.42*  CALCIUM 8.5*   Liver Function Tests:  Recent Labs Lab 04/23/16 2100  AST 108*  ALT 43  ALKPHOS 122  BILITOT 13.6*  PROT 7.8  ALBUMIN 2.9*   No results for input(s): LIPASE, AMYLASE in the last 168 hours. No results for input(s): AMMONIA in the last 168 hours. CBC:  Recent Labs Lab 04/23/16 2100  WBC 9.2  NEUTROABS 4.5  HGB 7.6*  HCT 20.4*  MCV 96.7  PLT 216   Cardiac Enzymes:  No results for input(s): CKTOTAL, CKMB, CKMBINDEX, TROPONINI in the last 168 hours.  BNP (last 3 results) No results for input(s): BNP in the last 8760 hours.  ProBNP (last 3 results) No results for input(s): PROBNP in the last 8760 hours.  CBG: No results for input(s): GLUCAP in the last 168 hours.   Assessment/Plan Active Problems:   Sickle cell anemia with crisis ([place])   Admits to the Day Hospital  IVF D5 .45% Saline @ 150 mls/hour  Weight based Dilaudid PCA started within 30 minutes of admission  IV Toradol 30 mg Q 6 H  Monitor vitals very closely, Re-evaluate pain scale every hour  2 L of Oxygen by [place]  Patient will be re-evaluated for pain in the context of function and relationship to baseline as care progresses.  If no significant relieve from pain (remains above 5/10) will transfer patient to inpatient services for further evaluation and management  Code Status: Full  Family Communication: None  DVT Prophylaxis: Ambulate as tolerated   Time spent: 42 Minutes  Calleigh Lafontant, MD, MHA, FACP, FAAP, CPE  If 7PM-7AM, please contact night-coverage [email or web address] 04/27/2016, 11:13 AM

## 2016-04-27 NOTE — Discharge Summary (Signed)
Physician Discharge Summary  Dawn [name] [id number] DOB: June 08, 1981 DOA: 04/27/2016  PCP: Angelica Chessman, MD  Admit date: 04/27/2016  Discharge date: 04/27/2016  Time spent: 30 minutes  Discharge Diagnoses:  Active Problems:   Sickle cell anemia with crisis [facility])   Discharge Condition: Stable  Diet recommendation: Regular  Filed Weights   04/27/16 1032  Weight: 167 lb (75.8 kg)    History of present illness:  Dawn [name] is a 35 y.o. female with history of sickle cell disease presented to the day hospital today with complaints of pain in her legs and lower back for about 3 days. She was seen at the ED on 04/23/2016 for the same. Pain is similar to her usual sickle cell crisis. She has been taking her MS Contin and Oxycodone as prescribed, took them this morning but no sustained relief. She rates her pain at 9/10, constant and sharp. She has no fever or chills, denies chest pain, no cough or SOB. No N/V/D. No abdominal pain, no urinary symptom. She is on Hydrea, folic acid. MS contin 15 mg q 12 hours and 0xycodone 10 mg q 4 hours prn.  Hospital Course:  Tonique L Bove was admitted to the day hospital with sickle cell painful crisis. Patient was treated with weight based IV Dilaudid PCA, IV Toradol as well as IV fluids. She also got clinician assisted doses of dilaudid IV push 2 mg Q 1 H x 2 doses.  Aijah showed significant improvement symptomatically, pain improved from 9 to 5 out of 10 at the time of discharge. Avonda will follow-up at the clinic as previously scheduled, continue with home medications as per prior to admission.  Discharge Instructions We discussed the need for good hydration, monitoring of hydration status, avoidance of heat, cold, stress, and infection triggers. We discussed the need to be compliant with taking Hydrea. Herberta was reminded of the need to seek medical attention of any symptoms of bleeding, anemia, or infection occurs.  Discharge Exam: Vitals:   04/27/16 1226  04/27/16 1455  BP: 122/72 128/75  Pulse: 96 94  Resp: 17 14  Temp:      General appearance: alert, cooperative and no distress Eyes: conjunctivae/corneas clear. PERRL, EOM's intact. Fundi benign. Neck: no adenopathy, no carotid bruit, no JVD, supple, symmetrical, trachea midline and thyroid not enlarged, symmetric, no tenderness/mass/nodules Back: symmetric, no curvature. ROM normal. No CVA tenderness. Resp: clear to auscultation bilaterally Chest wall: no tenderness Cardio: regular rate and rhythm, S1, S2 normal, no murmur, click, rub or gallop GI: soft, non-tender; bowel sounds normal; no masses, no organomegaly Extremities: extremities normal, atraumatic, no cyanosis or edema Pulses: 2+ and symmetric Skin: Skin color, texture, turgor normal. No rashes or lesions Neurologic: Grossly normal  Current Discharge Medication List    CONTINUE these medications which have NOT CHANGED   Details  acetaminophen (TYLENOL) 500 MG tablet Take 500 mg by mouth every 6 (six) hours as needed for mild pain.     folic acid (FOLVITE) 1 MG tablet Take 1 tablet (1 mg total) by mouth daily. 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 May take with food to minimize GI side effects. Qty: 60 capsule, Refills: 11   Associated Diagnoses: Hb-SS disease without crisis (HCC)    ibuprofen (ADVIL,MOTRIN) 200 MG tablet Take 200 mg by mouth every 6 (six) hours as needed  for mild pain or moderate pain.    loratadine (CLARITIN) 10 MG tablet Take 10 mg by mouth daily.    morphine (MS CONTIN) 15 MG 12 hr tablet Take 1 tablet (15 mg total) by mouth every 12 (twelve) hours. Qty: 60 tablet, Refills: 0   Associated Diagnoses: Hb-SS disease without crisis ([place])    Multiple Vitamin (MULTIVITAMIN WITH MINERALS) TABS tablet Take 1 tablet by mouth daily.    Oxycodone HCl 10 MG TABS Take 1  tablet (10 mg total) by mouth every 4 (four) hours as needed (breakthrough pain). Qty: 60 tablet, Refills: 0   Associated Diagnoses: Hb-SS disease without crisis (HCC)    promethazine (PHENERGAN) 25 MG tablet Take 1 tablet (25 mg total) by mouth every 6 (six) hours as needed for nausea or vomiting. Qty: 20 tablet, Refills: 0    Vitamin D, Ergocalciferol, (DRISDOL) 50000 UNITS CAPS capsule Take 1 capsule (50,000 Units total) by mouth every 7 (seven) days. Takes on Sundays. Qty: 4 capsule, Refills: 11   Associated Diagnoses: Vitamin D deficiency    levonorgestrel (MIRENA) 20 MCG/24HR IUD 1 each by Intrauterine route once.       Allergies  Allergen Reactions  . Desferal [Deferoxamine] Hives    Local reaction on arm only during infusion. Can take with benadryl   . Latex Other (See Comments)    REACTION: Pt experiences a burning sensation on contacted skin areas  . Lisinopril Other (See Comments) and Cough    REACTION: Sore/scratchy throat  . Tape Other (See Comments)    REACTION: Pt. Experiences a burning sensation on contacted skin areas     Significant Diagnostic Studies: No results found.  Signed:  Angelica Chessman MD, [place], [place], [place], [place]   04/27/2016, 4:38 PM

## 2016-04-27 NOTE — Discharge Instructions (Signed)

## 2016-04-28 ENCOUNTER — Encounter (HOSPITAL_COMMUNITY): Payer: Self-pay

## 2016-04-28 ENCOUNTER — Inpatient Hospital Stay (HOSPITAL_COMMUNITY): Payer: BLUE CROSS/BLUE SHIELD

## 2016-04-28 ENCOUNTER — Inpatient Hospital Stay (HOSPITAL_COMMUNITY)
Admission: EM | Admit: 2016-04-28 | Discharge: 2016-05-03 | DRG: 812 | Disposition: A | Discharge status: Home / Self Care | Payer: BLUE CROSS/BLUE SHIELD | Attending: Internal Medicine | Admitting: Internal Medicine

## 2016-04-28 ENCOUNTER — Emergency Department (HOSPITAL_COMMUNITY): Payer: BLUE CROSS/BLUE SHIELD

## 2016-04-28 DIAGNOSIS — F411 Generalized anxiety disorder: Secondary | ICD-10-CM | POA: Diagnosis present

## 2016-04-28 DIAGNOSIS — R079 Chest pain, unspecified: Secondary | ICD-10-CM

## 2016-04-28 DIAGNOSIS — Z809 Family history of malignant neoplasm, unspecified: Secondary | ICD-10-CM | POA: Diagnosis not present

## 2016-04-28 DIAGNOSIS — D5700 Hb-SS disease with crisis, unspecified: Principal | ICD-10-CM | POA: Diagnosis present

## 2016-04-28 DIAGNOSIS — Z23 Encounter for immunization: Secondary | ICD-10-CM | POA: Diagnosis not present

## 2016-04-28 DIAGNOSIS — Z8249 Family history of ischemic heart disease and other diseases of the circulatory system: Secondary | ICD-10-CM | POA: Diagnosis not present

## 2016-04-28 DIAGNOSIS — Z79891 Long term (current) use of opiate analgesic: Secondary | ICD-10-CM

## 2016-04-28 DIAGNOSIS — D571 Sickle-cell disease without crisis: Secondary | ICD-10-CM | POA: Diagnosis present

## 2016-04-28 DIAGNOSIS — I272 Other secondary pulmonary hypertension: Secondary | ICD-10-CM | POA: Diagnosis present

## 2016-04-28 DIAGNOSIS — R7989 Other specified abnormal findings of blood chemistry: Secondary | ICD-10-CM

## 2016-04-28 DIAGNOSIS — R791 Abnormal coagulation profile: Secondary | ICD-10-CM | POA: Diagnosis not present

## 2016-04-28 DIAGNOSIS — R17 Unspecified jaundice: Secondary | ICD-10-CM | POA: Diagnosis present

## 2016-04-28 DIAGNOSIS — Z833 Family history of diabetes mellitus: Secondary | ICD-10-CM | POA: Diagnosis not present

## 2016-04-28 LAB — COMPREHENSIVE METABOLIC PANEL
ALBUMIN: 2.9 g/dL — AB (ref 3.5–5.0)
ALK PHOS: 139 U/L — AB (ref 38–126)
ALT: 44 U/L (ref 14–54)
ANION GAP: 8 (ref 5–15)
AST: 133 U/L — ABNORMAL HIGH (ref 15–41)
BUN: 13 mg/dL (ref 6–20)
CALCIUM: 8.2 mg/dL — AB (ref 8.9–10.3)
CO2: 20 mmol/L — AB (ref 22–32)
Chloride: 105 mmol/L (ref 101–111)
Creatinine, Ser: 0.58 mg/dL (ref 0.44–1.00)
GFR calc non Af Amer: >60 mL/min (ref >60)
GLUCOSE: 74 mg/dL (ref 65–99)
POTASSIUM: 4.4 mmol/L (ref 3.5–5.1)
SODIUM: 133 mmol/L — AB (ref 135–145)
Total Bilirubin: 15 mg/dL — ABNORMAL HIGH (ref 0.3–1.2)
Total Protein: 7.8 g/dL (ref 6.5–8.1)

## 2016-04-28 LAB — URINE MICROSCOPIC-ADD ON

## 2016-04-28 LAB — URINALYSIS, ROUTINE W REFLEX MICROSCOPIC
GLUCOSE, UA: 100 mg/dL — AB
KETONES UR: NEGATIVE mg/dL
LEUKOCYTES UA: NEGATIVE
Nitrite: NEGATIVE
Specific Gravity, Urine: 1.025 (ref 1.005–1.030)
pH: 5.5 (ref 5.0–8.0)

## 2016-04-28 LAB — CBC WITH DIFFERENTIAL/PLATELET
BAND NEUTROPHILS: 0 %
Basophils Absolute: 0 10*3/uL (ref 0.0–0.1)
Basophils Relative: 0 %
Blasts: 0 %
EOS ABS: 0 10*3/uL (ref 0.0–0.7)
Eosinophils Relative: 0 %
HCT: 19.2 % — ABNORMAL LOW (ref 36.0–46.0)
HEMOGLOBIN: 7 g/dL — AB (ref 12.0–15.0)
LYMPHS ABS: 3.5 10*3/uL (ref 0.7–4.0)
LYMPHS PCT: 22 %
MCH: 35.7 pg — AB (ref 26.0–34.0)
MCHC: 36.5 g/dL — AB (ref 30.0–36.0)
MCV: 98 fL (ref 78.0–100.0)
MONO ABS: 1.9 10*3/uL — AB (ref 0.1–1.0)
Metamyelocytes Relative: 0 %
Monocytes Relative: 12 %
Myelocytes: 0 %
NEUTROS ABS: 10.6 10*3/uL — AB (ref 1.7–7.7)
NRBC: 5 /100{WBCs} — AB
Neutrophils Relative %: 66 %
OTHER: 0 %
PLATELETS: 144 10*3/uL — AB (ref 150–400)
PROMYELOCYTES ABS: 0 %
RBC: 1.96 MIL/uL — ABNORMAL LOW (ref 3.87–5.11)
RDW: 22.2 % — AB (ref 11.5–15.5)
WBC: 16 10*3/uL — ABNORMAL HIGH (ref 4.0–10.5)

## 2016-04-28 LAB — POC URINE PREG, ED: PREG TEST UR: NEGATIVE

## 2016-04-28 LAB — SAMPLE TO BLOOD BANK

## 2016-04-28 LAB — RETICULOCYTES
RBC.: 1.96 MIL/uL — AB (ref 3.87–5.11)
RETIC COUNT ABSOLUTE: 542.9 10*3/uL — AB (ref 19.0–186.0)
Retic Ct Pct: 27.7 % — ABNORMAL HIGH (ref 0.4–3.1)

## 2016-04-28 LAB — TROPONIN I

## 2016-04-28 LAB — D-DIMER, QUANTITATIVE: D-Dimer, Quant: 14.42 ug/mL-FEU — ABNORMAL HIGH (ref 0.00–0.50)

## 2016-04-28 MED ORDER — ONDANSETRON HCL 4 MG/2ML IJ SOLN: 4.0000 mg | Freq: Four times a day (QID) | INTRAMUSCULAR | Status: DC | PRN

## 2016-04-28 MED ORDER — HYDROMORPHONE HCL 2 MG/ML IJ SOLN
0.0250 mg/kg | INTRAMUSCULAR | Status: AC
  Administered 2016-04-28: 1.9 mg via INTRAVENOUS
  Filled 2016-04-28: qty 1

## 2016-04-28 MED ORDER — DIPHENHYDRAMINE HCL 50 MG/ML IJ SOLN
25.0000 mg | Freq: Once | INTRAMUSCULAR | Status: AC
  Administered 2016-04-28: 25 mg via INTRAVENOUS
  Filled 2016-04-28: qty 1

## 2016-04-28 MED ORDER — NALOXONE HCL 0.4 MG/ML IJ SOLN: 0.4000 mg | INTRAMUSCULAR | Status: DC | PRN

## 2016-04-28 MED ORDER — KETOROLAC TROMETHAMINE 30 MG/ML IJ SOLN
30.0000 mg | Freq: Four times a day (QID) | INTRAMUSCULAR | Status: AC
  Administered 2016-04-28 – 2016-04-30 (×8): 30 mg via INTRAVENOUS
  Filled 2016-04-28 (×8): qty 1

## 2016-04-28 MED ORDER — HYDROMORPHONE HCL 2 MG/ML IJ SOLN: 0.0313 mg/kg | INTRAMUSCULAR | Status: AC

## 2016-04-28 MED ORDER — HYDROMORPHONE HCL 2 MG/ML IJ SOLN: 0.0375 mg/kg | INTRAMUSCULAR | Status: AC

## 2016-04-28 MED ORDER — SODIUM CHLORIDE 0.9 % IV SOLN: INTRAVENOUS | Status: DC

## 2016-04-28 MED ORDER — HYDROMORPHONE HCL 2 MG/ML IJ SOLN
0.0313 mg/kg | INTRAMUSCULAR | Status: AC
  Administered 2016-04-28: 2.3 mg via INTRAVENOUS
  Filled 2016-04-28: qty 2

## 2016-04-28 MED ORDER — HYDROXYUREA 500 MG PO CAPS
1000.0000 mg | ORAL_CAPSULE | Freq: Every day | ORAL | Status: DC
Start: 2016-04-28 — End: 2016-05-03
  Administered 2016-04-28 – 2016-05-03 (×4): 1000 mg via ORAL
  Filled 2016-04-28 (×6): qty 2

## 2016-04-28 MED ORDER — DIPHENHYDRAMINE HCL 12.5 MG/5ML PO ELIX: 12.5000 mg | ORAL_SOLUTION | Freq: Four times a day (QID) | ORAL | Status: DC | PRN

## 2016-04-28 MED ORDER — FOLIC ACID 1 MG PO TABS
1.0000 mg | ORAL_TABLET | Freq: Every day | ORAL | Status: DC
  Administered 2016-04-28 – 2016-05-03 (×6): 1 mg via ORAL
  Filled 2016-04-28 (×7): qty 1

## 2016-04-28 MED ORDER — HYDROMORPHONE HCL 2 MG/ML IJ SOLN: 0.0250 mg/kg | INTRAMUSCULAR | Status: AC

## 2016-04-28 MED ORDER — HYDROMORPHONE HCL 2 MG/ML IJ SOLN
0.0375 mg/kg | INTRAMUSCULAR | Status: AC
  Administered 2016-04-28: 2.8 mg via INTRAVENOUS
  Filled 2016-04-28: qty 2

## 2016-04-28 MED ORDER — SODIUM CHLORIDE 0.9 % IV BOLUS (SEPSIS)
1000.0000 mL | Freq: Once | INTRAVENOUS | Status: AC
  Administered 2016-04-28: 1000 mL via INTRAVENOUS

## 2016-04-28 MED ORDER — PROMETHAZINE HCL 12.5 MG PO TABS
25.0000 mg | ORAL_TABLET | ORAL | Status: DC | PRN
  Administered 2016-04-28 – 2016-05-01 (×4): 25 mg via ORAL
  Filled 2016-04-28 (×4): qty 2

## 2016-04-28 MED ORDER — HYDROMORPHONE HCL 2 MG PO TABS
2.0000 mg | ORAL_TABLET | Freq: Once | ORAL | Status: AC
  Administered 2016-04-28: 2 mg via ORAL
  Filled 2016-04-28: qty 1

## 2016-04-28 MED ORDER — DIPHENHYDRAMINE HCL 50 MG/ML IJ SOLN: 12.5000 mg | Freq: Four times a day (QID) | INTRAMUSCULAR | Status: DC | PRN

## 2016-04-28 MED ORDER — HYDROMORPHONE 1 MG/ML IV SOLN
INTRAVENOUS | Status: DC
  Administered 2016-04-28 – 2016-04-29 (×2): via INTRAVENOUS
  Administered 2016-04-29: 7 mg via INTRAVENOUS
  Administered 2016-04-29: 01:00:00 via INTRAVENOUS
  Administered 2016-04-29: 1.4 mg via INTRAVENOUS
  Administered 2016-04-29: 9.8 mg via INTRAVENOUS
  Administered 2016-04-30: 0.7 mg via INTRAVENOUS
  Administered 2016-04-30: 4.2 mg via INTRAVENOUS
  Administered 2016-04-30: 0.7 mg via INTRAVENOUS
  Administered 2016-04-30: 2.8 mg via INTRAVENOUS
  Administered 2016-04-30: 0.7 mg via INTRAVENOUS
  Administered 2016-05-01: 04:00:00 via INTRAVENOUS
  Filled 2016-04-28 (×4): qty 25

## 2016-04-28 MED ORDER — KETOROLAC TROMETHAMINE 30 MG/ML IJ SOLN: 60.0000 mg | Freq: Four times a day (QID) | INTRAMUSCULAR | Status: DC

## 2016-04-28 MED ORDER — DEXTROSE-NACL 5-0.45 % IV SOLN
INTRAVENOUS | Status: DC
  Administered 2016-04-28: 19:00:00 via INTRAVENOUS
  Administered 2016-04-28: 1000 mL via INTRAVENOUS
  Administered 2016-04-29 – 2016-05-03 (×7): via INTRAVENOUS

## 2016-04-28 MED ORDER — SODIUM CHLORIDE 0.9% FLUSH: 9.0000 mL | INTRAVENOUS | Status: DC | PRN

## 2016-04-28 NOTE — Progress Notes (Addendum)
RN paged re elevated d dimer 14.42 (in past has been up but only around 3 range). Rounding MD documented concerns about possible PE 2/2 tachycardia and chest pain and recommended ck CTA Chest if d dimer positive. I have ordered stat. Pt is hemodynamically stable and sats are 98% on 2L. Hgb stable for her at 7 and TB 15 c/w sickle cell crisis. CXR negative so acute chest syndrome less likely. Discussed w/ Dr Shanon Brow who suspects elevated d dimer 2/2 acute sickle cell crisis.  Erin Hearing, ANP

## 2016-04-28 NOTE — ED Triage Notes (Signed)
Pt reports sickle cell pain but then states she is having chest pain which is not her typical sickle cell pain site.  Pt states the pain started 2 hour ago

## 2016-04-28 NOTE — ED Provider Notes (Signed)
TIME SEEN: 1:40 AM  CHIEF COMPLAINT: Sickle cell pain, chest pain  HPI: Pt is a 35 y.o. female with history of sickle cell disease, pulmonary hypertension who presents to the emergency department with one week of bilateral arm and leg pain that feels similar to her prior sickle cell pain and central chest pain that has been constant, sharp that started 3 hours ago. States her baseline hemoglobin is between 8 and 10. Denies history of PE or DVT, stroke. Did have an NSTEMI in the past which was secondary to demand ischemia from anemia. She denies a fevers or cough. Has had a history of acute chest syndrome but states this feels different. States her last transfusion was in May. Was at the sickle cell clinic earlier today for pain control. States she went home and pain got worse and she developed chest pain which concerned her.  Denies shortness of breath, nausea, vomiting, diarrhea. Her PCP is Dr. Doreene Burke.  ROS: See HPI Constitutional: no fever  Eyes: no drainage  ENT: no runny nose   Cardiovascular:   chest pain  Resp: no SOB  GI: no vomiting GU: no dysuria Integumentary: no rash  Allergy: no hives  Musculoskeletal: no leg swelling  Neurological: no slurred speech ROS otherwise negative  PAST MEDICAL HISTORY/PAST SURGICAL HISTORY:  Past Medical History:  Diagnosis Date  . Blood transfusion   . Demand ischemia ([place]) 02/06/2016  . HCAP (healthcare-associated pneumonia) 05/19/2013  . 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 Pulmonary hypertension ([place]) 02/06/2016   49 mmHg per echo on 02/06/16   . 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 Sickle cell anemia (HCC)   . Sickle cell disease ([place])   . 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 Vitamin B12 deficiency 07/07/2015    MEDICATIONS:  Prior to Admission medications   Medication Sig Start Date End Date Taking? Authorizing Provider  acetaminophen (TYLENOL) 500 MG tablet Take 500 mg by mouth every 6 (six) hours as needed for mild pain.      Historical Provider, MD  folic acid (FOLVITE) 1 MG tablet Take 1 tablet (1 mg total) by mouth daily. 03/14/16   [name], FNP  hydroxyurea (HYDREA) 500 MG capsule Take 2 capsules (1,000 mg total) by mouth daily. May take with food to minimize GI side effects. 03/14/16   [name], FNP  ibuprofen (ADVIL,MOTRIN) 200 MG tablet Take 200 mg by mouth every 6 (six) hours as needed for mild pain or moderate pain.    Historical Provider, MD  levonorgestrel (MIRENA) 20 MCG/24HR IUD 1 each by Intrauterine route once.    Historical Provider, MD  loratadine (CLARITIN) 10 MG tablet Take 10 mg by mouth daily.    Historical Provider, MD  morphine (MS CONTIN) 15 MG 12 hr tablet Take 1 tablet (15 mg total) by mouth every 12 (twelve) hours. 03/01/16   [name], FNP  Multiple Vitamin (MULTIVITAMIN WITH MINERALS) TABS tablet Take 1 tablet by mouth daily.    Historical Provider, MD  Oxycodone HCl 10 MG TABS Take 1 tablet (10 mg total) by mouth every 4 (four) hours as needed (breakthrough pain). 04/06/16   Micheline Chapman, NP  promethazine (PHENERGAN) 25 MG tablet Take 1 tablet (25 mg total) by mouth every 6 (six) hours as needed for nausea or vomiting. 04/06/16   Micheline Chapman, NP  Vitamin D, Ergocalciferol, (DRISDOL) 50000 UNITS CAPS capsule Take 1 capsule (50,000 Units total) by mouth every 7 (seven) days. Takes  on Sundays. 08/16/15   [name], FNP    ALLERGIES:  Allergies  Allergen Reactions  . Desferal [Deferoxamine] Hives    Local reaction on arm only during infusion. Can take with benadryl   . Latex Other (See Comments)    REACTION: Pt experiences a burning sensation on contacted skin areas  . Lisinopril Other (See Comments) and Cough    REACTION: Sore/scratchy throat  . Tape Other (See Comments)    REACTION: Pt. Experiences a burning sensation on contacted skin areas    SOCIAL HISTORY:  Social History  Substance Use Topics  . Smoking status: Never Smoker  . Smokeless  tobacco: Never Used  . Alcohol use No    FAMILY HISTORY: Family History  Problem Relation Age of Onset  . Sickle cell trait Mother   . Sickle cell trait Father   . Hypertension Father   . Diabetes Father   . Sickle cell trait Sister   . Cancer Paternal Aunt     Breast    EXAM: BP 139/81 (BP Location: Right Arm)   Pulse 107   Temp 99.5 F (37.5 C) (Oral)   Resp 20   Ht 5\' 3"  (1.6 m)   Wt 164 lb (74.4 kg)   SpO2 94%   BMI 29.05 kg/m  CONSTITUTIONAL: Alert and oriented and responds appropriately to questions. Appears uncomfortable, afebrile, nontoxic, well-hydrated HEAD: Normocephalic EYES: Conjunctivae clear, PERRL ENT: normal nose; no rhinorrhea; moist mucous membranes NECK: Supple, no meningismus, no LAD  CARD: RRR; S1 and S2 appreciated; no murmurs, no clicks, no rubs, no gallops RESP: Normal chest excursion without splinting or tachypnea; breath sounds clear and equal bilaterally; no wheezes, no rhonchi, no rales, no hypoxia or respiratory distress, speaking full sentences ABD/GI: Normal bowel sounds; non-distended; soft, non-tender, no rebound, no guarding, no peritoneal signs BACK:  The back appears normal and is non-tender to palpation, there is no CVA tenderness EXT: Normal ROM in all joints; non-tender to palpation; no edema; normal capillary refill; no cyanosis, no calf tenderness or swelling    SKIN: Normal color for age and race; warm; no rash NEURO: Moves all extremities equally, sensation to light touch intact diffusely, cranial nerves II through XII intact PSYCH: The patient's mood and manner are appropriate. Grooming and personal hygiene are appropriate.  MEDICAL DECISION MAKING: Patient here with sickle cell pain, chest pain. Afebrile and nontoxic appearing but does appear uncomfortable. We'll give IV fluids, Dilaudid, Phenergan, Benadryl. Will obtain labs, troponin, chest x-ray.  ED PROGRESS: Patient's labs show leukocytosis, elevated reticulocyte count,  elevated total bilirubin. She appears to be a true sickle cell crisis. Pain is not been controlled with 3 doses of Dilaudid. Patient would like admission to the hospital I agree. She does not need a transfusion at this time, hemoglobin is 7.0. No active bleeding. Hemodynamically stable.  Chest x-ray shows no infiltrate. She does not appear to have a urinary tract infection. Suspect her leukocytosis is reactive.    4:30 AM  Discussed patient's case with hospitalist, Dr. Shanon Brow.  Recommend admission to observation, telemetry bed.  I will place holding orders per their request. Patient and family (if present) updated with plan. Care transferred to hospitalist service.  I reviewed all nursing notes, vitals, pertinent old records, EKGs, labs, imaging (as available).   EKG Interpretation  Date/Time:  Saturday April 28 2016 00:57:30 EDT Ventricular Rate:  105 PR Interval:    QRS Duration: 96 QT Interval:  346 QTC Calculation: 458 R Axis:  65 Text Interpretation:  Sinus tachycardia Consider inferior infarct Baseline wander in lead(s) II III aVL aVF V4 Confirmed by WARD,  DO, KRISTEN [id number]) on 04/28/2016 4:22:08 AM         Delice Bison Ward, DO 04/28/16 [id number]

## 2016-04-28 NOTE — H&P (Signed)
History and Physical    Dawn [name] [id number] DOB: 08/14/1981 DOA: 04/28/2016  PCP: Angelica Chessman, MD  Patient coming from: home  Chief Complaint:  pain  HPI: Dawn [name] is a 35 y.o. female with medical history significant of sickle cell pain comes in with over one day of worsening pain in her legs and chest.  Pt has freq sickle cell crisis, and has h/o iron overload from freq transfusions.  Denies fevers.  Her typical pain is in her legs but not always in her chest.  She denies any cough or swelling in her calfs.  Her pain is in the upper thighs and knees.  She has had no n/v/d.  No recent illnesses.  Pt referred for admission for sickle cell crisis.  She has gotten several rounds of dilaudid iv and 2 liters of ivf and has had some relief but not much.   Review of Systems: As per HPI otherwise 10 point review of systems negative.   Past Medical History:  Diagnosis Date  . Blood transfusion   . Demand ischemia ([place]) 02/06/2016  . HCAP (healthcare-associated pneumonia) 05/19/2013  . 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 Pulmonary hypertension ([place]) 02/06/2016   49 mmHg per echo on 02/06/16   . 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 Sickle cell anemia (HCC)   . Sickle cell disease ([place])   . Sickle cell disease, type S ([place])   . Vitamin B12 deficiency 07/07/2015    Past Surgical History:  Procedure Laterality Date  .  left knee ACL reconstruction    . CESAREAN SECTION     x 2  . CHOLECYSTECTOMY    . PORT-A-CATH REMOVAL Left 07/29/2013   Procedure: REMOVAL PORT-A-CATH;  Surgeon: Scherry Ran, MD;  Location: [facility];  Service: General;  Laterality: Left;  . PORTACATH PLACEMENT    . PORTACATH PLACEMENT Left 02/23/2013   Procedure: INSERTION PORT-A-CATH;  Surgeon: Donato Heinz, MD;  Location: [facility] ORS;  Service: General;  Laterality: Left;  . TEE WITHOUT CARDIOVERSION N/A 08/07/2013   Procedure: TRANSESOPHAGEAL ECHOCARDIOGRAM (TEE);  Surgeon: Arnoldo Lenis, MD;   Location: [facility];  Service: Cardiology;  Laterality: N/A;     reports that she has never smoked. She has never used smokeless tobacco. She reports that she does not drink alcohol or use drugs.  Allergies  Allergen Reactions  . Desferal [Deferoxamine] Hives    Local reaction on arm only during infusion. Can take with benadryl   . Latex Other (See Comments)    REACTION: Pt experiences a burning sensation on contacted skin areas  . Lisinopril Other (See Comments) and Cough    REACTION: Sore/scratchy throat  . Tape Other (See Comments)    REACTION: Pt. Experiences a burning sensation on contacted skin areas    Family History  Problem Relation Age of Onset  . Sickle cell trait Mother   . Sickle cell trait Father   . Hypertension Father   . Diabetes Father   . Sickle cell trait Sister   . Cancer Paternal Aunt     Breast    Prior to Admission medications   Medication Sig Start Date End Date Taking? Authorizing Provider  acetaminophen (TYLENOL) 500 MG tablet Take 500 mg by mouth every 6 (six) hours as needed for mild pain.     Historical Provider, MD  folic acid (FOLVITE) 1 MG tablet Take 1 tablet (1 mg total) by mouth daily. 03/14/16   [name], FNP  hydroxyurea (HYDREA) 500 MG capsule Take 2 capsules (1,000 mg total) by mouth daily. May take with food to minimize GI side effects. 03/14/16   [name], FNP  ibuprofen (ADVIL,MOTRIN) 200 MG tablet Take 200 mg by mouth every 6 (six) hours as needed for mild pain or moderate pain.    Historical Provider, MD  levonorgestrel (MIRENA) 20 MCG/24HR IUD 1 each by Intrauterine route once.    Historical Provider, MD  loratadine (CLARITIN) 10 MG tablet Take 10 mg by mouth daily.    Historical Provider, MD  morphine (MS CONTIN) 15 MG 12 hr tablet Take 1 tablet (15 mg total) by mouth every 12 (twelve) hours. 03/01/16   [name], FNP  Multiple Vitamin (MULTIVITAMIN WITH MINERALS) TABS tablet Take 1 tablet by mouth daily.     Historical Provider, MD  Oxycodone HCl 10 MG TABS Take 1 tablet (10 mg total) by mouth every 4 (four) hours as needed (breakthrough pain). 04/06/16   Micheline Chapman, NP  promethazine (PHENERGAN) 25 MG tablet Take 1 tablet (25 mg total) by mouth every 6 (six) hours as needed for nausea or vomiting. 04/06/16   Micheline Chapman, NP  Vitamin D, Ergocalciferol, (DRISDOL) 50000 UNITS CAPS capsule Take 1 capsule (50,000 Units total) by mouth every 7 (seven) days. Takes on Sundays. 08/16/15   [name], FNP    Physical Exam: Vitals:   04/28/16 0048 04/28/16 0203 04/28/16 0300 04/28/16 0400  BP: 139/81 140/72 118/86 117/75  Pulse: 107     Resp: 20 24 19 25   Temp: 99.5 F (37.5 C)     TempSrc: Oral     SpO2: 94%     Weight: 74.4 kg (164 lb)     Height: 5\' 3"  (1.6 m)         Constitutional: NAD, calm, comfortable Vitals:   04/28/16 0048 04/28/16 0203 04/28/16 0300 04/28/16 0400  BP: 139/81 140/72 118/86 117/75  Pulse: 107     Resp: 20 24 19 25   Temp: 99.5 F (37.5 C)     TempSrc: Oral     SpO2: 94%     Weight: 74.4 kg (164 lb)     Height: 5\' 3"  (1.6 m)      Eyes: PERRL, lids and conjunctivae normal pt appears uncomfortable and in pain ENMT: Mucous membranes are moist. Posterior pharynx clear of any exudate or lesions.Normal dentition.  Neck: normal, supple, no masses, no thyromegaly Respiratory: clear to auscultation bilaterally, no wheezing, no crackles. Normal respiratory effort. No accessory muscle use.  Cardiovascular: Regular rate and rhythm, no murmurs / rubs / gallops. No extremity edema. 2+ pedal pulses. No carotid bruits.  Abdomen: no tenderness, no masses palpated. No hepatosplenomegaly. Bowel sounds positive.  Musculoskeletal: no clubbing / cyanosis. No joint deformity upper and lower extremities. Good ROM, no contractures. Normal muscle tone.  Skin: no rashes, lesions, ulcers. No induration Neurologic: CN 2-12 grossly intact. Sensation intact, DTR normal. Strength  5/5 in all 4.  Psychiatric: Normal judgment and insight. Alert and oriented x 3. Normal mood.    Labs on Admission: I have personally reviewed following labs and imaging studies  CBC:  Recent Labs Lab 04/23/16 2100 04/28/16 0108  WBC 9.2 16.0*  NEUTROABS 4.5 10.6*  HGB 7.6* 7.0*  HCT 20.4* 19.2*  MCV 96.7 98.0  PLT 216 [id number]*   Basic Metabolic Panel:  Recent Labs Lab 04/23/16 2100 04/28/16 0108  NA 132* 133*  K 3.8 4.4  CL 106 105  CO2 22 20*  GLUCOSE 81 74  BUN 6 13  CREATININE 0.42* 0.58  CALCIUM 8.5* 8.2*   GFR: Estimated Creatinine Clearance: 94.8 mL/min (by C-G formula based on SCr of 0.8 mg/dL). Liver Function Tests:  Recent Labs Lab 04/23/16 2100 04/28/16 0108  AST 108* 133*  ALT 43 44  ALKPHOS 122 139*  BILITOT 13.6* 15.0*  PROT 7.8 7.8  ALBUMIN 2.9* 2.9*   No results for input(s): LIPASE, AMYLASE in the last 168 hours. No results for input(s): AMMONIA in the last 168 hours. Coagulation Profile: No results for input(s): INR, PROTIME in the last 168 hours. Cardiac Enzymes:  Recent Labs Lab 04/28/16 0108  TROPONINI <0.03   BNP (last 3 results) No results for input(s): PROBNP in the last 8760 hours. HbA1C: No results for input(s): HGBA1C in the last 72 hours. CBG: No results for input(s): GLUCAP in the last 168 hours. Lipid Profile: No results for input(s): CHOL, HDL, LDLCALC, TRIG, CHOLHDL, LDLDIRECT in the last 72 hours. Thyroid Function Tests: No results for input(s): TSH, T4TOTAL, FREET4, T3FREE, THYROIDAB in the last 72 hours. Anemia Panel:  Recent Labs  04/28/16 0108  RETICCTPCT 27.7*   Urine analysis:    Component Value Date/Time   COLORURINE YELLOW 04/28/2016 0250   APPEARANCEUR CLEAR 04/28/2016 0250   LABSPEC 1.025 04/28/2016 0250   PHURINE 5.5 04/28/2016 0250   GLUCOSEU 100 (A) 04/28/2016 0250   HGBUR LARGE (A) 04/28/2016 0250   BILIRUBINUR LARGE (A) 04/28/2016 0250   KETONESUR NEGATIVE 04/28/2016 0250   PROTEINUR  >300 (A) 04/28/2016 0250   UROBILINOGEN 1.0 03/01/2016 0909   NITRITE NEGATIVE 04/28/2016 0250   LEUKOCYTESUR NEGATIVE 04/28/2016 0250   Sepsis Labs: !!!!!!!!!!!!!!!!!!!!!!!!!!!!!!!!!!!!!!!!!!!! @LABRCNTIP (procalcitonin:4,lacticidven:4) )No results found for this or any previous visit (from the past 240 hour(s)).   Radiological Exams on Admission: Dg Chest 2 View  Result Date: 04/28/2016 CLINICAL DATA:  Sickle cell pain. Midsternal chest pain. Shortness of breath. Symptoms for 2 hours. EXAM: CHEST  2 VIEW COMPARISON:  02/06/2016 FINDINGS: Mild cardiomegaly is unchanged from prior exam. Borderline mild interstitial thickening, stable. No focal airspace opacity, pleural effusion or pneumothorax. No pulmonary edema. 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 IMPRESSION: Stable cardiomegaly.  No acute pulmonary process. Electronically Signed   By: Jeb Levering M.D.   On: 04/28/2016 02:05    Assessment/Plan 35 yo female with sickle cell crisis with acute pain  Principal Problem:    Sickle cell anemia with crisis ([place])-  Aggressive ivf, will place on dilaudid PCA per paraemeters set forth by dr Zigmund Daniel concerning her customized settings.  Her threshold to transfuse is 6.  Repeat cbc in am.  Pt reports she takes her folic acid and hydrea and is compliant with that.   Active Problems:   Transfusion associated hemochromatosis   Anxiety state   Pulmonary hypertension ([place])   obs on tele bed.    DVT prophylaxis: scds Code Status:   Full code  Nusaybah Ivie A MD [facility]  If 7PM-7AM, please contact night-coverage [email or web address] Password [facility]  04/28/2016, 4:34 AM

## 2016-04-28 NOTE — Progress Notes (Signed)
PROGRESS NOTE  Dawn [name]  [id number] DOB: May 30, 1981 DOA: 04/28/2016 PCP: Angelica Chessman, MD Outpatient Specialists:  Subjective: Reports that she was yesterday admitted to the day hospital at [facility] and she felt okay. Reported having severe chest pain so came into the hospital for further evaluation.  Brief Narrative:  35 year old female with past history of sickle cell disease came in with chest pain, likely sickle cell painful crisis. Also has pain in the lower legs. Has anemia so we'll minimize blood draws.  Assessment & Plan:   Principal Problem:   Hyperbilirubinemia Active Problems:   Transfusion associated hemochromatosis   Sickle cell anemia with crisis [facility])   Anxiety state   Pulmonary hypertension ([place])   This is in charge note, patient seen earlier today by my colleague Dr. Shanon Brow. If seen and examined the patient, reviewed chart and all notes. Presented with sickle cell painful crisis, started on aggressive hydration with IV fluids, Toradol and hydromorphone PCA. Check d-dimer's because of tachycardia and chest pain. If positive will order CTA.  DVT prophylaxis:  Code Status: Full Code Family Communication:  Disposition Plan:  Diet: Diet Heart Room service appropriate? Yes; Fluid consistency: Thin  Consultants:   None  Procedures:   None  Antimicrobials:   None   Objective: Vitals:   04/28/16 0501 04/28/16 0549 04/28/16 0650 04/28/16 0800  BP: 117/68 (!) 141/70    Pulse: 104 (!) 103    Resp: 18 20 15 18   Temp:  99.5 F (37.5 C)    TempSrc:  Oral    SpO2:  100% 100% 99%  Weight:  80.5 kg (177 lb 6.4 oz)    Height:       No intake or output data in the 24 hours ending 04/28/16 1103 Filed Weights   04/28/16 0048 04/28/16 0549  Weight: 74.4 kg (164 lb) 80.5 kg (177 lb 6.4 oz)    Examination: General exam: Appears calm and comfortable  Respiratory system: Clear to auscultation. Respiratory effort normal. Cardiovascular system: S1  & S2 heard, RRR. No JVD, murmurs, rubs, gallops or clicks. No pedal edema. Gastrointestinal system: Abdomen is nondistended, soft and nontender. No organomegaly or masses felt. Normal bowel sounds heard. Central nervous system: Alert and oriented. No focal neurological deficits. Extremities: Symmetric 5 x 5 power. Skin: No rashes, lesions or ulcers Psychiatry: Judgement and insight appear normal. Mood & affect appropriate.   Data Reviewed: I have personally reviewed following labs and imaging studies  CBC:  Recent Labs Lab 04/23/16 2100 04/28/16 0108  WBC 9.2 16.0*  NEUTROABS 4.5 10.6*  HGB 7.6* 7.0*  HCT 20.4* 19.2*  MCV 96.7 98.0  PLT 216 123456*   Basic Metabolic Panel:  Recent Labs Lab 04/23/16 2100 04/28/16 0108  NA 132* 133*  K 3.8 4.4  CL 106 105  CO2 22 20*  GLUCOSE 81 74  BUN 6 13  CREATININE 0.42* 0.58  CALCIUM 8.5* 8.2*   GFR: Estimated Creatinine Clearance: 98.5 mL/min (by C-G formula based on SCr of 0.8 mg/dL). Liver Function Tests:  Recent Labs Lab 04/23/16 2100 04/28/16 0108  AST 108* 133*  ALT 43 44  ALKPHOS 122 139*  BILITOT 13.6* 15.0*  PROT 7.8 7.8  ALBUMIN 2.9* 2.9*   No results for input(s): LIPASE, AMYLASE in the last 168 hours. No results for input(s): AMMONIA in the last 168 hours. Coagulation Profile: No results for input(s): INR, PROTIME in the last 168 hours. Cardiac Enzymes:  Recent Labs Lab 04/28/16 0108  TROPONINI <  0.03   BNP (last 3 results) No results for input(s): PROBNP in the last 8760 hours. HbA1C: No results for input(s): HGBA1C in the last 72 hours. CBG: No results for input(s): GLUCAP in the last 168 hours. Lipid Profile: No results for input(s): CHOL, HDL, LDLCALC, TRIG, CHOLHDL, LDLDIRECT in the last 72 hours. Thyroid Function Tests: No results for input(s): TSH, T4TOTAL, FREET4, T3FREE, THYROIDAB in the last 72 hours. Anemia Panel:  Recent Labs  04/28/16 0108  RETICCTPCT 27.7*   Urine analysis:      Component Value Date/Time   COLORURINE YELLOW 04/28/2016 0250   APPEARANCEUR CLEAR 04/28/2016 0250   LABSPEC 1.025 04/28/2016 0250   PHURINE 5.5 04/28/2016 0250   GLUCOSEU 100 (A) 04/28/2016 0250   HGBUR LARGE (A) 04/28/2016 0250   BILIRUBINUR LARGE (A) 04/28/2016 0250   KETONESUR NEGATIVE 04/28/2016 0250   PROTEINUR >300 (A) 04/28/2016 0250   UROBILINOGEN 1.0 03/01/2016 0909   NITRITE NEGATIVE 04/28/2016 0250   LEUKOCYTESUR NEGATIVE 04/28/2016 0250   Sepsis Labs: @LABRCNTIP (procalcitonin:4,lacticidven:4)  )No results found for this or any previous visit (from the past 240 hour(s)).   Invalid input(s): PROCALCITONIN, LACTICACIDVEN   Radiology Studies: Dg Chest 2 View  Result Date: 04/28/2016 CLINICAL DATA:  Sickle cell pain. Midsternal chest pain. Shortness of breath. Symptoms for 2 hours. EXAM: CHEST  2 VIEW COMPARISON:  02/06/2016 FINDINGS: Mild cardiomegaly is unchanged from prior exam. Borderline mild interstitial thickening, stable. No focal airspace opacity, pleural effusion or pneumothorax. No pulmonary edema. 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 IMPRESSION: Stable cardiomegaly.  No acute pulmonary process. Electronically Signed   By: Jeb Levering M.D.   On: 04/28/2016 02:05        Scheduled Meds: . folic acid  1 mg Oral Daily  . HYDROmorphone   Intravenous Q4H  . hydroxyurea  1,000 mg Oral Daily  . ketorolac  60 mg Intravenous Q6H   Continuous Infusions: . dextrose 5 % and 0.45% NaCl 1,000 mL (04/28/16 0203)     LOS: 0 days    Time spent: 35 minutes    Kerline Trahan A, MD [facility] Pager [phone]  If 7PM-7AM, please contact night-coverage [email or web address] Password [email or web address] 04/28/2016, 11:03 AM

## 2016-04-28 NOTE — ED Notes (Signed)
Patient transported to X-ray 

## 2016-04-29 ENCOUNTER — Inpatient Hospital Stay (HOSPITAL_COMMUNITY): Payer: BLUE CROSS/BLUE SHIELD

## 2016-04-29 DIAGNOSIS — I272 Other secondary pulmonary hypertension: Secondary | ICD-10-CM

## 2016-04-29 DIAGNOSIS — D5700 Hb-SS disease with crisis, unspecified: Principal | ICD-10-CM

## 2016-04-29 DIAGNOSIS — F411 Generalized anxiety disorder: Secondary | ICD-10-CM

## 2016-04-29 MED ORDER — TECHNETIUM TO 99M ALBUMIN AGGREGATED
4.0000 | Freq: Once | INTRAVENOUS | Status: AC | PRN
  Administered 2016-04-29: 4 via INTRAVENOUS

## 2016-04-29 MED ORDER — TECHNETIUM TC 99M DIETHYLENETRIAME-PENTAACETIC ACID
30.0000 | Freq: Once | INTRAVENOUS | Status: AC | PRN
  Administered 2016-04-29: 32 via RESPIRATORY_TRACT

## 2016-04-29 MED ORDER — HEPARIN SODIUM (PORCINE) 5000 UNIT/ML IJ SOLN
5000.0000 [IU] | Freq: Three times a day (TID) | INTRAMUSCULAR | Status: DC
  Administered 2016-04-29 – 2016-05-01 (×7): 5000 [IU] via SUBCUTANEOUS
  Filled 2016-04-29 (×11): qty 1

## 2016-04-29 NOTE — Progress Notes (Signed)
PROGRESS NOTE  Dawn [name]  [id number] DOB: 1981/06/17 DOA: 04/28/2016 PCP: Angelica Chessman, MD Outpatient Specialists:  Subjective: Reports that she was yesterday admitted to the day hospital at [facility] and she felt okay. Reported having severe chest pain so came into the hospital for further evaluation.  Brief Narrative:  35 year old female with past history of sickle cell disease came in with chest pain, likely sickle cell painful crisis. Also has pain in the lower legs. Has anemia so we'll minimize blood draws.  Assessment & Plan:   Principal Problem:   Hyperbilirubinemia Active Problems:   Sickle cell anemia (HCC)   Transfusion associated hemochromatosis   Sickle cell anemia with crisis ([place])   Anxiety state   Pulmonary hypertension (HCC)   Sickle cell anemia with painful and hemolytic crisis -Presented with chest pain and upper thigh pain. -Started on aggressive hydration with IV fluids and Dilaudid PCA. -Toradol to help decrease inflammation. -I discussed with Dr. Jonelle Sidle of the sickle cell service at [facility], recommended transfer if pain is not controlled. -I spoke with the patient she wants stay in [facility].  Chest pain -This is likely secondary to her sickle cell disease, -1 of troponin. -She has significantly positive d-dimer 14, VQ scan done (would've prefer CTA to see there is any other pathology) and showed no evidence of PE. -Elevated d-dimer likely secondary to sickle cell process and likely associated hemolysis.  Jaundice -Patient has significant hyperbilirubinemia was total bilirubin of 15.0, patient could have hemolytic crisis as well. -Continue folic acid and hydroxyurea.  Transfusion associated hemochromatosis -Has sickle cell anemia, her threshold for transfusion has home Clomid of 6.0. -No blood work done today to minimize blood draws. -Check CBC in a.m.  Anxiety state -Denies significant complaints.  Pulmonary  hypertension -This is likely secondary to her sickle cell disease, stable.     DVT prophylaxis: SQ heparin Code Status: Full Code Family Communication:  Disposition Plan:  Diet: Diet Heart Room service appropriate? Yes; Fluid consistency: Thin  Consultants:   None  Procedures:   None  Antimicrobials:   None   Objective: Vitals:   04/29/16 0106 04/29/16 0445 04/29/16 0624 04/29/16 0800  BP:   130/70   Pulse:   (!) 109   Resp: 19 (!) 25 (!) 26 15  Temp:   98.8 F (37.1 C)   TempSrc:   Oral   SpO2: 97% 96% 97% 100%  Weight:      Height:        Intake/Output Summary (Last 24 hours) at 04/29/16 1152 Last data filed at 04/29/16 0900  Gross per 24 hour  Intake          2552.92 ml  Output                0 ml  Net          2552.92 ml   Filed Weights   04/28/16 0048 04/28/16 0549  Weight: 74.4 kg (164 lb) 80.5 kg (177 lb 6.4 oz)    Examination: General exam: Appears calm and comfortable  Respiratory system: Clear to auscultation. Respiratory effort normal. Cardiovascular system: S1 & S2 heard, RRR. No JVD, murmurs, rubs, gallops or clicks. No pedal edema. Gastrointestinal system: Abdomen is nondistended, soft and nontender. No organomegaly or masses felt. Normal bowel sounds heard. Central nervous system: Alert and oriented. No focal neurological deficits. Extremities: Symmetric 5 x 5 power. Skin: No rashes, lesions or ulcers Psychiatry: Judgement and insight appear normal.  Mood & affect appropriate.   Data Reviewed: I have personally reviewed following labs and imaging studies  CBC:  Recent Labs Lab 04/23/16 2100 04/28/16 0108  WBC 9.2 16.0*  NEUTROABS 4.5 10.6*  HGB 7.6* 7.0*  HCT 20.4* 19.2*  MCV 96.7 98.0  PLT 216 123456*   Basic Metabolic Panel:  Recent Labs Lab 04/23/16 2100 04/28/16 0108  NA 132* 133*  K 3.8 4.4  CL 106 105  CO2 22 20*  GLUCOSE 81 74  BUN 6 13  CREATININE 0.42* 0.58  CALCIUM 8.5* 8.2*   GFR: Estimated Creatinine  Clearance: 98.5 mL/min (by C-G formula based on SCr of 0.8 mg/dL). Liver Function Tests:  Recent Labs Lab 04/23/16 2100 04/28/16 0108  AST 108* 133*  ALT 43 44  ALKPHOS 122 139*  BILITOT 13.6* 15.0*  PROT 7.8 7.8  ALBUMIN 2.9* 2.9*   No results for input(s): LIPASE, AMYLASE in the last 168 hours. No results for input(s): AMMONIA in the last 168 hours. Coagulation Profile: No results for input(s): INR, PROTIME in the last 168 hours. Cardiac Enzymes:  Recent Labs Lab 04/28/16 0108  TROPONINI <0.03   BNP (last 3 results) No results for input(s): PROBNP in the last 8760 hours. HbA1C: No results for input(s): HGBA1C in the last 72 hours. CBG: No results for input(s): GLUCAP in the last 168 hours. Lipid Profile: No results for input(s): CHOL, HDL, LDLCALC, TRIG, CHOLHDL, LDLDIRECT in the last 72 hours. Thyroid Function Tests: No results for input(s): TSH, T4TOTAL, FREET4, T3FREE, THYROIDAB in the last 72 hours. Anemia Panel:  Recent Labs  04/28/16 0108  RETICCTPCT 27.7*   Urine analysis:    Component Value Date/Time   COLORURINE YELLOW 04/28/2016 0250   APPEARANCEUR CLEAR 04/28/2016 0250   LABSPEC 1.025 04/28/2016 0250   PHURINE 5.5 04/28/2016 0250   GLUCOSEU 100 (A) 04/28/2016 0250   HGBUR LARGE (A) 04/28/2016 0250   BILIRUBINUR LARGE (A) 04/28/2016 0250   KETONESUR NEGATIVE 04/28/2016 0250   PROTEINUR >300 (A) 04/28/2016 0250   UROBILINOGEN 1.0 03/01/2016 0909   NITRITE NEGATIVE 04/28/2016 0250   LEUKOCYTESUR NEGATIVE 04/28/2016 0250   Sepsis Labs: @LABRCNTIP (procalcitonin:4,lacticidven:4)  )No results found for this or any previous visit (from the past 240 hour(s)).   Invalid input(s): PROCALCITONIN, LACTICACIDVEN   Radiology Studies: Dg Chest 2 View  Result Date: 04/28/2016 CLINICAL DATA:  Sickle cell pain. Midsternal chest pain. Shortness of breath. Symptoms for 2 hours. EXAM: CHEST  2 VIEW COMPARISON:  02/06/2016 FINDINGS: Mild cardiomegaly is  unchanged from prior exam. Borderline mild interstitial thickening, stable. No focal airspace opacity, pleural effusion or pneumothorax. No pulmonary edema. 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 IMPRESSION: Stable cardiomegaly.  No acute pulmonary process. Electronically Signed   By: Jeb Levering M.D.   On: 04/28/2016 02:05   Nm Pulmonary Perf And Vent  Result Date: 04/29/2016 CLINICAL DATA:  35 year old female with shortness of breath and positive D-dimer EXAM: NUCLEAR MEDICINE VENTILATION - PERFUSION LUNG SCAN TECHNIQUE: Ventilation images were obtained in multiple projections using inhaled aerosol Tc-55m DTPA. Perfusion images were obtained in multiple projections after intravenous injection of Tc-65m MAA. RADIOPHARMACEUTICALS:  Thirty-two mCi Technetium-15m DTPA aerosol inhalation and 4 mCi Technetium-33m MAA IV COMPARISON:  Chest radiograph dated 04/28/2016 FINDINGS: Ventilation: No focal ventilation defect. Perfusion: No wedge shaped peripheral perfusion defects to suggest acute pulmonary embolism. IMPRESSION: Normal study. Electronically Signed   By: Anner Crete M.D.   On: 04/29/2016 01:05  Scheduled Meds: . folic acid  1 mg Oral Daily  . HYDROmorphone   Intravenous Q4H  . hydroxyurea  1,000 mg Oral Daily  . ketorolac  30 mg Intravenous Q6H   Continuous Infusions: . dextrose 5 % and 0.45% NaCl 125 mL/hr at 04/28/16 1903     LOS: 1 day    Time spent: 35 minutes    Octa Uplinger A, MD [facility] Pager [phone]  If 7PM-7AM, please contact night-coverage [email or web address] Password [email or web address] 04/29/2016, 11:52 AM

## 2016-04-30 LAB — CBC
HEMATOCRIT: 13.4 % — AB (ref 36.0–46.0)
HEMOGLOBIN: 4.9 g/dL — AB (ref 12.0–15.0)
MCH: 35.5 pg — AB (ref 26.0–34.0)
MCHC: 36.6 g/dL — AB (ref 30.0–36.0)
MCV: 97.1 fL (ref 78.0–100.0)
Platelets: 131 10*3/uL — ABNORMAL LOW (ref 150–400)
RBC: 1.38 MIL/uL — AB (ref 3.87–5.11)
RDW: 23 % — ABNORMAL HIGH (ref 11.5–15.5)
WBC: 11.6 10*3/uL — ABNORMAL HIGH (ref 4.0–10.5)

## 2016-04-30 LAB — PREPARE RBC (CROSSMATCH)

## 2016-04-30 MED ORDER — DIPHENHYDRAMINE HCL 50 MG/ML IJ SOLN
12.5000 mg | Freq: Once | INTRAMUSCULAR | Status: AC
  Administered 2016-04-30: 12.5 mg via INTRAVENOUS
  Filled 2016-04-30: qty 1

## 2016-04-30 MED ORDER — ACETAMINOPHEN 325 MG PO TABS
650.0000 mg | ORAL_TABLET | Freq: Once | ORAL | Status: AC
  Administered 2016-04-30: 650 mg via ORAL
  Filled 2016-04-30: qty 2

## 2016-04-30 MED ORDER — HYDROMORPHONE HCL 1 MG/ML IJ SOLN
0.5000 mg | Freq: Once | INTRAMUSCULAR | Status: AC
  Administered 2016-04-30: 0.5 mg via INTRAVENOUS
  Filled 2016-04-30: qty 1

## 2016-04-30 NOTE — Care Management Note (Signed)
Case Management Note  Patient Details  Name: Dawn [name] MRN: [id number] Date of Birth: Oct 21, 1980  Subjective/Objective:  Patient is from home, ind with ADL's. She has a PCP and insurance.                   Action/Plan: Anticipate DC home with self care.    Expected Discharge Date:                  Expected Discharge Plan:  Home/Self Care  In-House Referral:  NA  Discharge planning Services  CM Consult  Post Acute Care Choice:  NA Choice offered to:  NA  DME Arranged:    DME Agency:     HH Arranged:    HH Agency:     Status of Service:  Completed, signed off  If discussed at [employer] of Stay Meetings, dates discussed:    Additional Comments:  Carlin Mamone, Chauncey Reading, RN 04/30/2016, 12:08 PM

## 2016-04-30 NOTE — Care Management Important Message (Signed)
Important Message  Patient Details  Name: Dawn [name] MRN: [id number] Date of Birth: April 25, 1981   Medicare Important Message Given:  Yes    Beautiful Pensyl, Chauncey Reading, RN 04/30/2016, 12:09 PM

## 2016-04-30 NOTE — Progress Notes (Signed)
PROGRESS NOTE  Dawn [name]  [id number] DOB: 1980/12/11 DOA: 04/28/2016 PCP: Angelica Chessman, MD Outpatient Specialists:  Subjective: Patient continued to complain about pain in upper soft thighs and bilateral chest pain. Hemoglobin dropped down to 4.9, will transfuse 2 units of packed RBCs.  Brief Narrative:  35 year old female with past history of sickle cell disease came in with chest pain, likely sickle cell painful crisis. Also has pain in the lower legs. Has anemia so we'll minimize blood draws.  Assessment & Plan:   Principal Problem:   Hyperbilirubinemia Active Problems:   Sickle cell anemia (HCC)   Transfusion associated hemochromatosis   Sickle cell anemia with crisis (HCC)   Anxiety state   Pulmonary hypertension (HCC)   Sickle cell anemia with painful and possible sequestration crisis -Presented with chest pain and upper thigh pain. -Started on aggressive hydration with IV fluids and Dilaudid PCA. -Toradol to help decrease inflammation. -Still complaining about pain, continue Dilaudid PCA and aggressive hydration with IV fluids.  Anemia -Patient baseline hemoglobin is around 7-8 and he has 6 g per desufflate her threshold for transfusion. -Hemoglobin was 7.0 on 8/5, skipped on the 6 to minimize blood draws, hemoglobin today is 4.9. -No evidence of aplastic crisis, reticulocyte was 542 on admission, transfused 2 units of packed RBCs. -Antibodies screen and fall crossmatch prior to transfusion.  Chest pain -This is likely secondary to her sickle cell disease, -1 set of of troponin. -She has significantly positive d-dimer 14, VQ scan done (would've prefer CTA to see there is any other pathology) and showed no evidence of PE. -Elevated d-dimer likely secondary to sickle cell process and likely associated hemolysis.  Jaundice -Patient has significant hyperbilirubinemia was total bilirubin of 15.0, patient could have hemolytic crisis as well. -Continue folic acid  and hydroxyurea.  Transfusion associated hemochromatosis -Has sickle cell anemia, her threshold for transfusion has home Clomid of 6.0. -No blood work done today to minimize blood draws. -Check CBC in a.m.  Anxiety state -Denies significant complaints.  Pulmonary hypertension -This is likely secondary to her sickle cell disease, stable.     DVT prophylaxis: SQ heparin Code Status: Full Code Family Communication:  Disposition Plan:  Diet: Diet Heart Room service appropriate? Yes; Fluid consistency: Thin  Consultants:   None  Procedures:   None  Antimicrobials:   None   Objective: Vitals:   04/30/16 0000 04/30/16 0400 04/30/16 0615 04/30/16 0800  BP:   115/67   Pulse:   (!) 106   Resp: 16 16 18  (!) 23  Temp:   98.6 F (37 C)   TempSrc:   Oral   SpO2: 92% 94% 98% 100%  Weight:      Height:        Intake/Output Summary (Last 24 hours) at 04/30/16 1238 Last data filed at 04/30/16 0500  Gross per 24 hour  Intake             4075 ml  Output                5 ml  Net             4070 ml   Filed Weights   04/28/16 0048 04/28/16 0549  Weight: 74.4 kg (164 lb) 80.5 kg (177 lb 6.4 oz)    Examination: General exam: Appears calm and comfortable  Respiratory system: Clear to auscultation. Respiratory effort normal. Cardiovascular system: S1 & S2 heard, RRR. No JVD, murmurs, rubs, gallops or clicks. No pedal edema. Gastrointestinal  system: Abdomen is nondistended, soft and nontender. No organomegaly or masses felt. Normal bowel sounds heard. Central nervous system: Alert and oriented. No focal neurological deficits. Extremities: Symmetric 5 x 5 power. Skin: No rashes, lesions or ulcers Psychiatry: Judgement and insight appear normal. Mood & affect appropriate.   Data Reviewed: I have personally reviewed following labs and imaging studies  CBC:  Recent Labs Lab 04/23/16 2100 04/28/16 0108 04/30/16 0611  WBC 9.2 16.0* 11.6*  NEUTROABS 4.5 10.6*  --   HGB  7.6* 7.0* 4.9*  HCT 20.4* 19.2* 13.4*  MCV 96.7 98.0 97.1  PLT 216 144* [id number]*   Basic Metabolic Panel:  Recent Labs Lab 04/23/16 2100 04/28/16 0108  NA 132* 133*  K 3.8 4.4  CL 106 105  CO2 22 20*  GLUCOSE 81 74  BUN 6 13  CREATININE 0.42* 0.58  CALCIUM 8.5* 8.2*   GFR: Estimated Creatinine Clearance: 98.5 mL/min (by C-G formula based on SCr of 0.8 mg/dL). Liver Function Tests:  Recent Labs Lab 04/23/16 2100 04/28/16 0108  AST 108* 133*  ALT 43 44  ALKPHOS 122 139*  BILITOT 13.6* 15.0*  PROT 7.8 7.8  ALBUMIN 2.9* 2.9*   No results for input(s): LIPASE, AMYLASE in the last 168 hours. No results for input(s): AMMONIA in the last 168 hours. Coagulation Profile: No results for input(s): INR, PROTIME in the last 168 hours. Cardiac Enzymes:  Recent Labs Lab 04/28/16 0108  TROPONINI <0.03   BNP (last 3 results) No results for input(s): PROBNP in the last 8760 hours. HbA1C: No results for input(s): HGBA1C in the last 72 hours. CBG: No results for input(s): GLUCAP in the last 168 hours. Lipid Profile: No results for input(s): CHOL, HDL, LDLCALC, TRIG, CHOLHDL, LDLDIRECT in the last 72 hours. Thyroid Function Tests: No results for input(s): TSH, T4TOTAL, FREET4, T3FREE, THYROIDAB in the last 72 hours. Anemia Panel:  Recent Labs  04/28/16 0108  RETICCTPCT 27.7*   Urine analysis:    Component Value Date/Time   COLORURINE YELLOW 04/28/2016 0250   APPEARANCEUR CLEAR 04/28/2016 0250   LABSPEC 1.025 04/28/2016 0250   PHURINE 5.5 04/28/2016 0250   GLUCOSEU 100 (A) 04/28/2016 0250   HGBUR LARGE (A) 04/28/2016 0250   BILIRUBINUR LARGE (A) 04/28/2016 0250   KETONESUR NEGATIVE 04/28/2016 0250   PROTEINUR >300 (A) 04/28/2016 0250   UROBILINOGEN 1.0 03/01/2016 0909   NITRITE NEGATIVE 04/28/2016 0250   LEUKOCYTESUR NEGATIVE 04/28/2016 0250   Sepsis Labs: @LABRCNTIP (procalcitonin:4,lacticidven:4)  )No results found for this or any previous visit (from the past  240 hour(s)).   Invalid input(s): PROCALCITONIN, LACTICACIDVEN   Radiology Studies: Nm Pulmonary Perf And Vent  Result Date: 04/29/2016 CLINICAL DATA:  35 year old female with shortness of breath and positive D-dimer EXAM: NUCLEAR MEDICINE VENTILATION - PERFUSION LUNG SCAN TECHNIQUE: Ventilation images were obtained in multiple projections using inhaled aerosol Tc-69m DTPA. Perfusion images were obtained in multiple projections after intravenous injection of Tc-74m MAA. RADIOPHARMACEUTICALS:  Thirty-two mCi Technetium-64m DTPA aerosol inhalation and 4 mCi Technetium-50m MAA IV COMPARISON:  Chest radiograph dated 04/28/2016 FINDINGS: Ventilation: No focal ventilation defect. Perfusion: No wedge shaped peripheral perfusion defects to suggest acute pulmonary embolism. IMPRESSION: Normal study. Electronically Signed   By: Anner Crete M.D.   On: 04/29/2016 01:05        Scheduled Meds: . folic acid  1 mg Oral Daily  . heparin subcutaneous  5,000 Units Subcutaneous Q8H  . HYDROmorphone   Intravenous Q4H  . hydroxyurea  1,000 mg Oral  Daily   Continuous Infusions: . dextrose 5 % and 0.45% NaCl 125 mL/hr at 04/30/16 0852     LOS: 2 days    Time spent: 35 minutes    Eastin Swing A, MD [facility] Pager [phone]  If 7PM-7AM, please contact night-coverage [email or web address] Password [facility] 04/30/2016, 12:38 PM

## 2016-04-30 NOTE — Progress Notes (Signed)
[id number] lab called to report Hgb 4.9, MD notified.

## 2016-05-01 LAB — CBC
HCT: 22.2 % — ABNORMAL LOW (ref 36.0–46.0)
Hemoglobin: 8.1 g/dL — ABNORMAL LOW (ref 12.0–15.0)
MCH: 32.8 pg (ref 26.0–34.0)
MCHC: 36.5 g/dL — ABNORMAL HIGH (ref 30.0–36.0)
MCV: 89.9 fL (ref 78.0–100.0)
PLATELETS: 146 10*3/uL — AB (ref 150–400)
RBC: 2.47 MIL/uL — ABNORMAL LOW (ref 3.87–5.11)
RDW: 22.9 % — AB (ref 11.5–15.5)
WBC: 11.2 10*3/uL — ABNORMAL HIGH (ref 4.0–10.5)

## 2016-05-01 MED ORDER — HYDROMORPHONE HCL 1 MG/ML IJ SOLN
1.0000 mg | INTRAMUSCULAR | Status: DC | PRN
  Administered 2016-05-02 – 2016-05-03 (×7): 1 mg via INTRAVENOUS
  Filled 2016-05-01 (×7): qty 1

## 2016-05-01 MED ORDER — MORPHINE SULFATE ER 15 MG PO TBCR
15.0000 mg | EXTENDED_RELEASE_TABLET | Freq: Two times a day (BID) | ORAL | Status: DC
  Administered 2016-05-01 – 2016-05-03 (×5): 15 mg via ORAL
  Filled 2016-05-01 (×5): qty 1

## 2016-05-01 MED ORDER — OXYCODONE HCL 5 MG PO TABS
10.0000 mg | ORAL_TABLET | ORAL | Status: DC | PRN
  Administered 2016-05-01 – 2016-05-03 (×5): 10 mg via ORAL
  Filled 2016-05-01 (×5): qty 2

## 2016-05-01 NOTE — Progress Notes (Signed)
PROGRESS NOTE  Dawn [name]  [id number] DOB: [birth year] DOA: 04/28/2016 PCP: Angelica Chessman, MD Outpatient Specialists:  Subjective: Pain is better 7/10, pain on admission was 9/10. Hemoglobin improved 8.1 after transfusion of 2 units of packed RBCs. Discussed with her for very long time, will discontinue hydromorphone PCA and start her on home OxyContin, OxyIR. Started as needed pushes of hydromorphone. Start to ambulate, if pain is better Be discharged tomorrow.  Brief Narrative:  35 year old female with past history of sickle cell disease came in with chest pain, likely sickle cell painful crisis. Also has pain in the lower legs. Has anemia so we'll minimize blood draws.  Assessment & Plan:   Principal Problem:   Hyperbilirubinemia Active Problems:   Sickle cell anemia (HCC)   Transfusion associated hemochromatosis   Sickle cell anemia with crisis (HCC)   Anxiety state   Pulmonary hypertension (HCC)   Sickle cell anemia with painful and possible sequestration crisis -Presented with chest pain and upper thigh pain. -Started on aggressive hydration with IV fluids and Dilaudid PCA. -Toradol to help decrease inflammation. -Still complaining about pain, continue Dilaudid PCA and aggressive hydration with IV fluids.  Anemia -Patient baseline hemoglobin is around 7-8 and he has 6 g per desufflate her threshold for transfusion. -Hemoglobin was 7.0 on 8/5, skipped on the 6 to minimize blood draws, hemoglobin today is 4.9. -No evidence of aplastic crisis, reticulocyte was 542 on admission, transfused 2 units of packed RBCs. -Transfused 2 units of packed RBCs, hemoglobin improved to 8.1, check CBC in a.m.  Chest pain -This is likely secondary to her sickle cell disease, -1 set of of troponin. -She has significantly positive d-dimer 14, VQ scan done (would've prefer CTA to see there is any other pathology) and showed no evidence of PE. -Elevated d-dimer likely secondary to sickle  cell process and likely associated hemolysis.  Jaundice -Patient has significant hyperbilirubinemia was total bilirubin of 15.0, patient could have hemolytic crisis as well. -Continue folic acid and hydroxyurea.  Transfusion associated hemochromatosis -Has sickle cell anemia, her threshold for transfusion has home Clomid of 6.0. -No blood work done today to minimize blood draws. -Check CBC in a.m.  Anxiety state -Denies significant complaints.  Pulmonary hypertension -This is likely secondary to her sickle cell disease, stable.     DVT prophylaxis: SQ heparin Code Status: Full Code Family Communication:  Disposition Plan: Home in 1-2 days based on pain scale. Diet: Diet Heart Room service appropriate? Yes; Fluid consistency: Thin  Consultants:   None  Procedures:   None  Antimicrobials:   None   Objective: Vitals:   05/01/16 0221 05/01/16 0351 05/01/16 0513 05/01/16 0800  BP:   128/76   Pulse:   98   Resp: 18 (!) 23 (!) 24 18  Temp:   99.5 F (37.5 C)   TempSrc:   Oral   SpO2: 97% 97% 99% 98%  Weight:      Height:        Intake/Output Summary (Last 24 hours) at 05/01/16 1151 Last data filed at 05/01/16 0930  Gross per 24 hour  Intake             1002 ml  Output                0 ml  Net             1002 ml   Filed Weights   04/28/16 0048 04/28/16 0549  Weight: 74.4 kg (164 lb) 80.5 kg (  177 lb 6.4 oz)    Examination: General exam: Appears calm and comfortable  Respiratory system: Clear to auscultation. Respiratory effort normal. Cardiovascular system: S1 & S2 heard, RRR. No JVD, murmurs, rubs, gallops or clicks. No pedal edema. Gastrointestinal system: Abdomen is nondistended, soft and nontender. No organomegaly or masses felt. Normal bowel sounds heard. Central nervous system: Alert and oriented. No focal neurological deficits. Extremities: Symmetric 5 x 5 power. Skin: No rashes, lesions or ulcers Psychiatry: Judgement and insight appear normal.  Mood & affect appropriate.   Data Reviewed: I have personally reviewed following labs and imaging studies  CBC:  Recent Labs Lab 04/28/16 0108 04/30/16 0611 05/01/16 0816  WBC 16.0* 11.6* 11.2*  NEUTROABS 10.6*  --   --   HGB 7.0* 4.9* 8.1*  HCT 19.2* 13.4* 22.2*  MCV 98.0 97.1 89.9  PLT 144* 131* 123456*   Basic Metabolic Panel:  Recent Labs Lab 04/28/16 0108  NA 133*  K 4.4  CL 105  CO2 20*  GLUCOSE 74  BUN 13  CREATININE 0.58  CALCIUM 8.2*   GFR: Estimated Creatinine Clearance: 98.5 mL/min (by C-G formula based on SCr of 0.8 mg/dL). Liver Function Tests:  Recent Labs Lab 04/28/16 0108  AST 133*  ALT 44  ALKPHOS 139*  BILITOT 15.0*  PROT 7.8  ALBUMIN 2.9*   No results for input(s): LIPASE, AMYLASE in the last 168 hours. No results for input(s): AMMONIA in the last 168 hours. Coagulation Profile: No results for input(s): INR, PROTIME in the last 168 hours. Cardiac Enzymes:  Recent Labs Lab 04/28/16 0108  TROPONINI <0.03   BNP (last 3 results) No results for input(s): PROBNP in the last 8760 hours. HbA1C: No results for input(s): HGBA1C in the last 72 hours. CBG: No results for input(s): GLUCAP in the last 168 hours. Lipid Profile: No results for input(s): CHOL, HDL, LDLCALC, TRIG, CHOLHDL, LDLDIRECT in the last 72 hours. Thyroid Function Tests: No results for input(s): TSH, T4TOTAL, FREET4, T3FREE, THYROIDAB in the last 72 hours. Anemia Panel: No results for input(s): VITAMINB12, FOLATE, FERRITIN, TIBC, IRON, RETICCTPCT in the last 72 hours. Urine analysis:    Component Value Date/Time   COLORURINE YELLOW 04/28/2016 0250   APPEARANCEUR CLEAR 04/28/2016 0250   LABSPEC 1.025 04/28/2016 0250   PHURINE 5.5 04/28/2016 0250   GLUCOSEU 100 (A) 04/28/2016 0250   HGBUR LARGE (A) 04/28/2016 0250   BILIRUBINUR LARGE (A) 04/28/2016 0250   KETONESUR NEGATIVE 04/28/2016 0250   PROTEINUR >300 (A) 04/28/2016 0250   UROBILINOGEN 1.0 03/01/2016 0909    NITRITE NEGATIVE 04/28/2016 0250   LEUKOCYTESUR NEGATIVE 04/28/2016 0250   Sepsis Labs: @LABRCNTIP (procalcitonin:4,lacticidven:4)  )No results found for this or any previous visit (from the past 240 hour(s)).   Invalid input(s): PROCALCITONIN, [place]   Radiology Studies: No results found.      Scheduled Meds: . folic acid  1 mg Oral Daily  . heparin subcutaneous  5,000 Units Subcutaneous Q8H  . hydroxyurea  1,000 mg Oral Daily  . morphine  15 mg Oral Q12H   Continuous Infusions: . dextrose 5 % and 0.45% NaCl 100 mL/hr at 05/01/16 0332     LOS: 3 days    Time spent: 35 minutes    Shilynn Hoch A, MD [facility] Pager [phone]  If 7PM-7AM, please contact night-coverage [email or web address] Password [email or web address] 05/01/2016, 11:51 AM

## 2016-05-02 LAB — CBC
HEMATOCRIT: 21.2 % — AB (ref 36.0–46.0)
HEMOGLOBIN: 7.6 g/dL — AB (ref 12.0–15.0)
MCH: 32.2 pg (ref 26.0–34.0)
MCHC: 35.8 g/dL (ref 30.0–36.0)
MCV: 89.8 fL (ref 78.0–100.0)
Platelets: 119 10*3/uL — ABNORMAL LOW (ref 150–400)
RBC: 2.36 MIL/uL — ABNORMAL LOW (ref 3.87–5.11)
RDW: 24.7 % — ABNORMAL HIGH (ref 11.5–15.5)
WBC: 8.8 10*3/uL (ref 4.0–10.5)

## 2016-05-02 MED ORDER — PNEUMOCOCCAL VAC POLYVALENT 25 MCG/0.5ML IJ INJ
0.5000 mL | INJECTION | INTRAMUSCULAR | Status: AC
  Administered 2016-05-03: 0.5 mL via INTRAMUSCULAR
  Filled 2016-05-02: qty 0.5

## 2016-05-02 NOTE — Progress Notes (Signed)
PROGRESS NOTE  AMYLYNN TINDOL  [id number] DOB: Sep 09, 1981 DOA: 04/28/2016 PCP: Angelica Chessman, MD   Brief Narrative:  35 year old female with past history of sickle cell disease came in with chest pain, likely sickle cell painful crisis. Also has pain in the lower legs. Has anemia so we'll minimize blood draws.  Assessment & Plan:   Principal Problem:   Hyperbilirubinemia Active Problems:   Sickle cell anemia (HCC)   Transfusion associated hemochromatosis   Sickle cell anemia with crisis (HCC)   Anxiety state   Pulmonary hypertension (HCC)   Sickle cell anemia with painful and possible sequestration crisis -Presented with chest pain, lower back and upper thighs pain. -Started on aggressive hydration with IV fluids and Dilaudid PCA. -Toradol to help decrease inflammation and for pain control adjuvant  -Still complaining about pain, but improved; now 24 hours off PCA -continue hydration with IV fluids; resume PO home analgesics and PRN pushes of dilaudid -will follow symptoms; hopefully ready for discharge tomorrow.  Anemia -Patient baseline hemoglobin is around 7-8  -Hemoglobin down 4.9. -No evidence of aplastic crisis, reticulocyte was 542 on admission, transfused 2 units of packed RBCs. -hemoglobin improved and patient symptoms also started to get better   Chest pain -This is likely secondary to her sickle cell disease,  -neg set of of troponin. -She has significantly positive d-dimer 14, VQ scan done and showed no evidence of PE (low probability; no hypoxemia). -Elevated d-dimer likely secondary to sickle cell process and likely associated hemolysis.  Jaundice -Patient has significant hyperbilirubinemia on admission (total bilirubin of 15.0), patient could have hemolytic crisis as well. -Continue folic acid and hydroxyurea. -no icterus -will follow bilirubin   Transfusion associated hemochromatosis -Has sickle cell anemia, her threshold for transfusion is Hgb of  6.0 -will follow Hgb and ferritin in am  Anxiety state -Denies significant complaints.  Pulmonary hypertension -This is likely secondary to her sickle cell disease, stable overall -no SOB and no requiring oxygen    DVT prophylaxis: SCD Code Status: Full Code Family Communication: no family at bedside  Disposition Plan: Home in 1-2 days based on pain scale. Diet: Diet Heart Room service appropriate? Yes; Fluid consistency: Thin  Consultants:   None  Procedures:   None  Antimicrobials:   None   Objective: Vitals:   05/01/16 2036 05/02/16 0159 05/02/16 0517 05/02/16 1300  BP:   (!) 117/59 129/71  Pulse:   93 90  Resp:   20 20  Temp:  100 F (37.8 C) 98.9 F (37.2 C) 99.1 F (37.3 C)  TempSrc:  Oral Oral Oral  SpO2: 93%  100% 99%  Weight:      Height:        Intake/Output Summary (Last 24 hours) at 05/02/16 1838 Last data filed at 05/02/16 1800  Gross per 24 hour  Intake             1620 ml  Output                0 ml  Net             1620 ml   Filed Weights   04/28/16 0048 04/28/16 0549  Weight: 74.4 kg (164 lb) 80.5 kg (177 lb 6.4 oz)    Examination: General exam: Afebrile, still complaining of pain in her chest, lower back and legs; no nausea, no vomiting.  Respiratory system: Clear to auscultation. Respiratory effort normal. Cardiovascular system: S1 & S2 heard, RRR. No JVD, murmurs, rubs, gallops  or clicks. No pedal edema. Gastrointestinal system: Abdomen is nondistended, soft and nontender. No organomegaly or masses felt. Normal bowel sounds heard. Central nervous system: Alert and oriented. No focal neurological deficits. Extremities: Symmetric 5 x 5 power. Skin: No rashes, lesions or ulcers Psychiatry: Judgement and insight appear normal. Mood & affect appropriate.   Data Reviewed: I have personally reviewed following labs and imaging studies  CBC:  Recent Labs Lab 04/28/16 0108 04/30/16 0611 05/01/16 0816 05/02/16 0559  WBC 16.0*  11.6* 11.2* 8.8  NEUTROABS 10.6*  --   --   --   HGB 7.0* 4.9* 8.1* 7.6*  HCT 19.2* 13.4* 22.2* 21.2*  MCV 98.0 97.1 89.9 89.8  PLT 144* 131* 146* 123456*   Basic Metabolic Panel:  Recent Labs Lab 04/28/16 0108  NA 133*  K 4.4  CL 105  CO2 20*  GLUCOSE 74  BUN 13  CREATININE 0.58  CALCIUM 8.2*   GFR: Estimated Creatinine Clearance: 98.5 mL/min (by C-G formula based on SCr of 0.8 mg/dL).   Liver Function Tests:  Recent Labs Lab 04/28/16 0108  AST 133*  ALT 44  ALKPHOS 139*  BILITOT 15.0*  PROT 7.8  ALBUMIN 2.9*   Cardiac Enzymes:  Recent Labs Lab 04/28/16 0108  TROPONINI <0.03    Urine analysis:    Component Value Date/Time   COLORURINE YELLOW 04/28/2016 0250   APPEARANCEUR CLEAR 04/28/2016 0250   LABSPEC 1.025 04/28/2016 0250   PHURINE 5.5 04/28/2016 0250   GLUCOSEU 100 (A) 04/28/2016 0250   HGBUR LARGE (A) 04/28/2016 0250   BILIRUBINUR LARGE (A) 04/28/2016 0250   KETONESUR NEGATIVE 04/28/2016 0250   PROTEINUR >300 (A) 04/28/2016 0250   UROBILINOGEN 1.0 03/01/2016 0909   NITRITE NEGATIVE 04/28/2016 0250   LEUKOCYTESUR NEGATIVE 04/28/2016 0250     Radiology Studies: No results found.    Scheduled Meds: . folic acid  1 mg Oral Daily  . heparin subcutaneous  5,000 Units Subcutaneous Q8H  . hydroxyurea  1,000 mg Oral Daily  . morphine  15 mg Oral Q12H  . [START ON 05/03/2016] pneumococcal 23 valent vaccine  0.5 mL Intramuscular Tomorrow-1000   Continuous Infusions: . dextrose 5 % and 0.45% NaCl 75 mL/hr at 05/01/16 1212     LOS: 4 days    Time spent: 30 minutes    Barton Dubois, MD [facility] Pager [phone]  If 7PM-7AM, please contact night-coverage [email or web address] Password [facility] 05/02/2016, 6:38 PM

## 2016-05-02 NOTE — Care Management Important Message (Signed)
Important Message  Patient Details  Name: Dawn [name] MRN: [id number] Date of Birth: 12-07-1980   Medicare Important Message Given:  Yes    Donesha Wallander, Chauncey Reading, RN 05/02/2016, 1:14 PM

## 2016-05-03 DIAGNOSIS — R791 Abnormal coagulation profile: Secondary | ICD-10-CM

## 2016-05-03 DIAGNOSIS — R7989 Other specified abnormal findings of blood chemistry: Secondary | ICD-10-CM

## 2016-05-03 LAB — CBC
HCT: 21.9 % — ABNORMAL LOW (ref 36.0–46.0)
HEMOGLOBIN: 8 g/dL — AB (ref 12.0–15.0)
MCH: 32.9 pg (ref 26.0–34.0)
MCHC: 36.5 g/dL — ABNORMAL HIGH (ref 30.0–36.0)
MCV: 90.1 fL (ref 78.0–100.0)
PLATELETS: 130 10*3/uL — AB (ref 150–400)
RBC: 2.43 MIL/uL — AB (ref 3.87–5.11)
RDW: 25.2 % — ABNORMAL HIGH (ref 11.5–15.5)
WBC: 7 10*3/uL (ref 4.0–10.5)

## 2016-05-03 LAB — COMPREHENSIVE METABOLIC PANEL
ALT: 30 U/L (ref 14–54)
AST: 76 U/L — AB (ref 15–41)
Albumin: 2.1 g/dL — ABNORMAL LOW (ref 3.5–5.0)
Alkaline Phosphatase: 106 U/L (ref 38–126)
Anion gap: 5 (ref 5–15)
BUN: 5 mg/dL — AB (ref 6–20)
CHLORIDE: 108 mmol/L (ref 101–111)
CO2: 23 mmol/L (ref 22–32)
Calcium: 7.5 mg/dL — ABNORMAL LOW (ref 8.9–10.3)
Creatinine, Ser: 0.38 mg/dL — ABNORMAL LOW (ref 0.44–1.00)
GFR calc Af Amer: >60 mL/min (ref >60)
Glucose, Bld: 92 mg/dL (ref 65–99)
POTASSIUM: 2.9 mmol/L — AB (ref 3.5–5.1)
SODIUM: 136 mmol/L (ref 135–145)
Total Bilirubin: 11.7 mg/dL — ABNORMAL HIGH (ref 0.3–1.2)
Total Protein: 6.2 g/dL — ABNORMAL LOW (ref 6.5–8.1)

## 2016-05-03 LAB — FERRITIN: FERRITIN: 962 ng/mL — AB (ref 11–307)

## 2016-05-03 MED ORDER — POTASSIUM CHLORIDE CRYS ER 20 MEQ PO TBCR
40.0000 meq | EXTENDED_RELEASE_TABLET | ORAL | Status: AC
  Administered 2016-05-03 (×3): 40 meq via ORAL
  Filled 2016-05-03 (×3): qty 2

## 2016-05-03 NOTE — Discharge Summary (Signed)
Physician Discharge Summary  Dawn [name] [id number] DOB: [birth year] DOA: 04/28/2016  PCP: Angelica Chessman, MD  Admit date: 04/28/2016 Discharge date: 05/03/2016  Time spent: 35 minutes  Recommendations for Outpatient Follow-up:  Repeat CBC to follow up with Hgb trend Repeat BMET to follow electrolytes and renal function    Discharge Diagnoses:  Principal Problem:   Hyperbilirubinemia Active Problems:   Sickle cell anemia (HCC)   Transfusion associated hemochromatosis   Sickle cell anemia with crisis ([name])   Anxiety state   Pulmonary hypertension ([place])   Positive D dimer   Discharge Condition: stable and improved. Discharge home with instructions to follow up with PCP in 10 days.  Diet recommendation: advise to watch sodium intake  Filed Weights   04/28/16 0048 04/28/16 0549  Weight: 74.4 kg (164 lb) 80.5 kg (177 lb 6.4 oz)    History of present illness:  As per H&P written by Dr. Shanon [name] on 04/28/16 35 y.o. female with medical history significant of sickle cell pain comes in with over one day of worsening pain in her legs and chest.  Pt has freq sickle cell crisis, and has h/o iron overload from freq transfusions.  Denies fevers.  Her typical pain is in her legs but not always in her chest.  She denies any cough or swelling in her calfs.  Her pain is in the upper thighs and knees.  She has had no n/v/d.  No recent illnesses.  Pt referred for admission for sickle cell crisis.  She has gotten several rounds of dilaudid iv and 2 liters of ivf and has had some relief but not much.  Hospital Course:  Sickle cell anemia with painful and possible sequestration crisis -Presented with chest pain, lower back and upper thighs pain. -Started on aggressive hydration with IV fluids and Dilaudid PCA. -Toradol to help decrease inflammation and for pain control adjuvant  -Still complaining about pain, but significantly improved; now 48 hours off PCA prior to discharge -advise to keep herself  well hydrated and will continue PO home analgesics and ibuprofen -follow up with PCP and sickle cell clinic at discharge  Anemia -Patient baseline hemoglobin is around 7-8  -Hemoglobin down 4.9. No evidence of aplastic crisis, reticulocyte was 542 on admission, transfused 2 units of packed RBCs. -hemoglobin improved and patient symptoms also started to get better  -at discharge Hgb remained at 8.0 range  Chest pain -This is likely secondary to her sickle cell disease pain crisis   -neg set of of troponin. -She has significantly positive d-dimer 14, VQ scan done and showed no evidence of PE (low probability; no hypoxemia). -Elevated d-dimer likely secondary to sickle cell process and likely associated hemolysis. -no ischemic abnormalities on EKG and/or telemetry   Jaundice -Patient has significant hyperbilirubinemia on admission (total bilirubin of 15.7), patient could have hemolytic crisis as well. -Continue folic acid and hydroxyurea. -no icterus currently -repeat bilirubin prior to discharge 11   Transfusion associated hemochromatosis -Has sickle cell anemia, her threshold for transfusion is Hgb of 6.0 -Hgb at 8.0 at discharge -Ferritin in the 900 range  Anxiety state -Denies significant complaints. -continue atarax as per home regimen orders   Pulmonary hypertension -This is likely secondary to her sickle cell disease, stable overall -no SOB and no requiring oxygen   Procedures:  See below for x-ray reports   Consultations:  None   Discharge Exam: Vitals:   05/03/16 0530 05/03/16 1300  BP: 115/65 (!) 114/59  Pulse: 89 90  Resp:  15 16  Temp: 98.6 F (37 C) 98.6 F (37 C)   General exam: Afebrile, still with mild complains of pain in her chest, lower back and legs; no nausea, no vomiting. reports pain well controlled with current analgesics that she takes at home. Respiratory system: Clear to auscultation. Respiratory effort normal. Cardiovascular  system: S1 & S2 heard, RRR. No JVD, murmurs, rubs, gallops or clicks. No pedal edema. Gastrointestinal system: Abdomen is nondistended, soft and nontender. No organomegaly or masses felt. Normal bowel sounds heard. Central nervous system: Alert and oriented. No focal neurological deficits. Extremities: Symmetric 5 x 5 power. Skin: No rashes, lesions or ulcers Psychiatry: Judgement and insight appear normal. Mood & affect appropriate.    Discharge Instructions   Discharge Instructions    Discharge instructions    Complete by:  As directed   Take medications as prescribed Use a MV on daily basis to provide electrolytes and mineral supplementation  Please arrange follow up with PCP in 10 days Keep yourself well hydrated     Current Discharge Medication List    CONTINUE these medications which have NOT CHANGED   Details  acetaminophen (TYLENOL) 500 MG tablet Take 500 mg by mouth every 6 (six) hours as needed for mild pain.     folic acid (FOLVITE) 1 MG tablet Take 1 tablet (1 mg total) by mouth daily. 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 May take with food to minimize GI side effects. Qty: 60 capsule, Refills: 11   Associated Diagnoses: Hb-SS disease without crisis (HCC)    hydrOXYzine (ATARAX/VISTARIL) 10 MG tablet TAKE 1 TABLET BY MOUTH 3 TIMES A DAY AS NEEDED FOR ITCHING Refills: 0    ibuprofen (ADVIL,MOTRIN) 200 MG tablet Take 200 mg by mouth every 6 (six) hours as needed for mild pain or moderate pain.    levonorgestrel (MIRENA) 20 MCG/24HR IUD 1 each by Intrauterine route once.    loratadine (CLARITIN) 10 MG tablet Take 10 mg by mouth daily.    morphine (MS CONTIN) 15 MG 12 hr tablet Take 1 tablet (15 mg total) by mouth every 12 (twelve) hours. Qty: 60 tablet, Refills: 0   Associated Diagnoses: Hb-SS disease without crisis ([place])     Multiple Vitamin (MULTIVITAMIN WITH MINERALS) TABS tablet Take 1 tablet by mouth daily.    Oxycodone HCl 10 MG TABS Take 1 tablet (10 mg total) by mouth every 4 (four) hours as needed (breakthrough pain). Qty: 60 tablet, Refills: 0   Associated Diagnoses: Hb-SS disease without crisis (HCC)    promethazine (PHENERGAN) 25 MG tablet Take 1 tablet (25 mg total) by mouth every 6 (six) hours as needed for nausea or vomiting. Qty: 20 tablet, Refills: 0    Vitamin D, Ergocalciferol, (DRISDOL) 50000 UNITS CAPS capsule Take 1 capsule (50,000 Units total) by mouth every 7 (seven) days. Takes on Sundays. Qty: 4 capsule, Refills: 11   Associated Diagnoses: Vitamin D deficiency       Allergies  Allergen Reactions  . Desferal [Deferoxamine] Hives    Local reaction on arm only during infusion. Can take with benadryl   . Latex Other (See Comments)    REACTION: Pt experiences a burning sensation on contacted skin areas  . Lisinopril Other (See Comments) and Cough    REACTION: Sore/scratchy throat  . Tape Other (See Comments)    REACTION: Pt.  Experiences a burning sensation on contacted skin areas   Follow-up Information    JEGEDE, OLUGBEMIGA, MD. Schedule an appointment as soon as possible for a visit in 10 day(s).   Specialty:  Internal Medicine Contact information: [place] [address] [phone]           The results of significant diagnostics from this hospitalization (including imaging, microbiology, ancillary and laboratory) are listed below for reference.    Significant Diagnostic Studies: Dg Chest 2 View  Result Date: 04/28/2016 CLINICAL DATA:  Sickle cell pain. Midsternal chest pain. Shortness of breath. Symptoms for 2 hours. EXAM: CHEST  2 VIEW COMPARISON:  02/06/2016 FINDINGS: Mild cardiomegaly is unchanged from prior exam. Borderline mild interstitial thickening, stable. No focal airspace opacity, pleural effusion or pneumothorax. No pulmonary edema.  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 IMPRESSION: Stable cardiomegaly.  No acute pulmonary process. Electronically Signed   By: Jeb Levering M.D.   On: 04/28/2016 02:05   Nm Pulmonary Perf And Vent  Result Date: 04/29/2016 CLINICAL DATA:  35 year old female with shortness of breath and positive D-dimer EXAM: NUCLEAR MEDICINE VENTILATION - PERFUSION LUNG SCAN TECHNIQUE: Ventilation images were obtained in multiple projections using inhaled aerosol Tc-12m DTPA. Perfusion images were obtained in multiple projections after intravenous injection of Tc-68m MAA. RADIOPHARMACEUTICALS:  Thirty-two mCi Technetium-70m DTPA aerosol inhalation and 4 mCi Technetium-18m MAA IV COMPARISON:  Chest radiograph dated 04/28/2016 FINDINGS: Ventilation: No focal ventilation defect. Perfusion: No wedge shaped peripheral perfusion defects to suggest acute pulmonary embolism. IMPRESSION: Normal study. Electronically Signed   By: Anner Crete M.D.   On: 04/29/2016 01:05    Microbiology: No results found for this or any previous visit (from the past 240 hour(s)).   Labs: Basic Metabolic Panel:  Recent Labs Lab 04/28/16 0108 05/03/16 0634  NA 133* 136  K 4.4 2.9*  CL 105 108  CO2 20* 23  GLUCOSE 74 92  BUN 13 5*  CREATININE 0.58 0.38*  CALCIUM 8.2* 7.5*   Liver Function Tests:  Recent Labs Lab 04/28/16 0108 05/03/16 0634  AST 133* 76*  ALT 44 30  ALKPHOS 139* 106  BILITOT 15.0* 11.7*  PROT 7.8 6.2*  ALBUMIN 2.9* 2.1*   CBC:  Recent Labs Lab 04/28/16 0108 04/30/16 0611 05/01/16 0816 05/02/16 0559 05/03/16 0634  WBC 16.0* 11.6* 11.2* 8.8 7.0  NEUTROABS 10.6*  --   --   --   --   HGB 7.0* 4.9* 8.1* 7.6* 8.0*  HCT 19.2* 13.4* 22.2* 21.2* 21.9*  MCV 98.0 97.1 89.9 89.8 90.1  PLT 144* 131* 146* 119* 130*   Cardiac Enzymes:  Recent Labs Lab 04/28/16 0108  TROPONINI <0.03     Signed:  Barton Dubois MD.  [facility] 05/03/2016, 3:16 PM

## 2016-05-03 NOTE — Progress Notes (Signed)
Patient discharged home, IVs removed and site intact. Patient sent home with all personal belongings.

## 2016-05-04 LAB — TYPE AND SCREEN
ABO/RH(D): A POS
ANTIBODY SCREEN: POSITIVE
DAT, IgG: NEGATIVE
UNIT DIVISION: 0
Unit division: 0
Unit division: 0

## 2016-05-31 ENCOUNTER — Non-Acute Institutional Stay (HOSPITAL_COMMUNITY)
Admission: AD | Admit: 2016-05-31 | Discharge: 2016-05-31 | Disposition: A | Discharge status: Home / Self Care | Payer: BLUE CROSS/BLUE SHIELD | Source: Ambulatory Visit | Attending: Internal Medicine | Admitting: Internal Medicine

## 2016-05-31 ENCOUNTER — Telehealth (HOSPITAL_COMMUNITY): Payer: Self-pay | Admitting: Hematology

## 2016-05-31 ENCOUNTER — Encounter (HOSPITAL_COMMUNITY): Payer: Self-pay

## 2016-05-31 ENCOUNTER — Telehealth: Payer: Self-pay

## 2016-05-31 DIAGNOSIS — E559 Vitamin D deficiency, unspecified: Secondary | ICD-10-CM

## 2016-05-31 DIAGNOSIS — D5700 Hb-SS disease with crisis, unspecified: Secondary | ICD-10-CM | POA: Diagnosis present

## 2016-05-31 DIAGNOSIS — Z79899 Other long term (current) drug therapy: Secondary | ICD-10-CM | POA: Diagnosis not present

## 2016-05-31 DIAGNOSIS — Z79891 Long term (current) use of opiate analgesic: Secondary | ICD-10-CM | POA: Insufficient documentation

## 2016-05-31 LAB — CBC WITH DIFFERENTIAL/PLATELET
BASOS ABS: 0 10*3/uL (ref 0.0–0.1)
BASOS PCT: 0 %
EOS ABS: 0.1 10*3/uL (ref 0.0–0.7)
Eosinophils Relative: 1 %
HEMATOCRIT: 23.1 % — AB (ref 36.0–46.0)
Hemoglobin: 8.5 g/dL — ABNORMAL LOW (ref 12.0–15.0)
LYMPHS PCT: 39 %
Lymphs Abs: 2 10*3/uL (ref 0.7–4.0)
MCH: 35.7 pg — ABNORMAL HIGH (ref 26.0–34.0)
MCHC: 36.8 g/dL — ABNORMAL HIGH (ref 30.0–36.0)
MCV: 97.1 fL (ref 78.0–100.0)
MONO ABS: 0.8 10*3/uL (ref 0.1–1.0)
MONOS PCT: 15 %
NEUTROS PCT: 45 %
NRBC: 7 /100{WBCs} — AB
Neutro Abs: 2.3 10*3/uL (ref 1.7–7.7)
PLATELETS: 121 10*3/uL — AB (ref 150–400)
RBC: 2.38 MIL/uL — ABNORMAL LOW (ref 3.87–5.11)
WBC: 5.2 10*3/uL (ref 4.0–10.5)

## 2016-05-31 LAB — RETICULOCYTES
RBC.: 2.43 MIL/uL — AB (ref 3.87–5.11)
RETIC COUNT ABSOLUTE: 364.5 10*3/uL — AB (ref 19.0–186.0)
Retic Ct Pct: 15 % — ABNORMAL HIGH (ref 0.4–3.1)

## 2016-05-31 LAB — LACTATE DEHYDROGENASE: LDH: 289 U/L — ABNORMAL HIGH (ref 98–192)

## 2016-05-31 MED ORDER — PROMETHAZINE HCL 25 MG PO TABS
12.5000 mg | ORAL_TABLET | ORAL | Status: DC | PRN
  Administered 2016-05-31: 12.5 mg via ORAL
  Filled 2016-05-31: qty 1

## 2016-05-31 MED ORDER — POLYETHYLENE GLYCOL 3350 17 G PO PACK: 17.0000 g | PACK | Freq: Every day | ORAL | Status: DC | PRN

## 2016-05-31 MED ORDER — PROMETHAZINE HCL 12.5 MG RE SUPP
12.5000 mg | RECTAL | Status: DC | PRN
  Filled 2016-05-31: qty 2

## 2016-05-31 MED ORDER — SODIUM CHLORIDE 0.9% FLUSH: 9.0000 mL | INTRAVENOUS | Status: DC | PRN

## 2016-05-31 MED ORDER — VITAMIN D (ERGOCALCIFEROL) 1.25 MG (50000 UNIT) PO CAPS: 50000.0000 [IU] | ORAL_CAPSULE | ORAL | 11 refills | Status: DC

## 2016-05-31 MED ORDER — NALOXONE HCL 0.4 MG/ML IJ SOLN: 0.4000 mg | INTRAMUSCULAR | Status: DC | PRN

## 2016-05-31 MED ORDER — HYDROMORPHONE 1 MG/ML IV SOLN
INTRAVENOUS | Status: DC
  Administered 2016-05-31: 5.5 mg via INTRAVENOUS
  Administered 2016-05-31: 11:00:00 via INTRAVENOUS
  Filled 2016-05-31: qty 25

## 2016-05-31 MED ORDER — SENNOSIDES-DOCUSATE SODIUM 8.6-50 MG PO TABS: 1.0000 | ORAL_TABLET | Freq: Two times a day (BID) | ORAL | Status: DC

## 2016-05-31 MED ORDER — KETOROLAC TROMETHAMINE 30 MG/ML IJ SOLN
30.0000 mg | Freq: Four times a day (QID) | INTRAMUSCULAR | Status: DC
  Administered 2016-05-31: 30 mg via INTRAVENOUS
  Filled 2016-05-31: qty 1

## 2016-05-31 MED ORDER — DIPHENHYDRAMINE HCL 25 MG PO CAPS
25.0000 mg | ORAL_CAPSULE | ORAL | Status: DC | PRN
  Administered 2016-05-31: 25 mg via ORAL
  Filled 2016-05-31: qty 1

## 2016-05-31 MED ORDER — PROMETHAZINE HCL 25 MG PO TABS: 25.0000 mg | ORAL_TABLET | Freq: Four times a day (QID) | ORAL | 0 refills | Status: DC | PRN

## 2016-05-31 MED ORDER — DEXTROSE-NACL 5-0.45 % IV SOLN
INTRAVENOUS | Status: DC
  Administered 2016-05-31: 11:00:00 via INTRAVENOUS

## 2016-05-31 MED ORDER — SODIUM CHLORIDE 0.9 % IV SOLN
25.0000 mg | INTRAVENOUS | Status: DC | PRN
  Filled 2016-05-31: qty 0.5

## 2016-05-31 NOTE — H&P (Signed)
[facility] History and Physical  Dawn [name] [id number] DOB: [birth year] DOA: 05/31/2016  PCP: Angelica Chessman, MD   Chief Complaint: No chief complaint on file.   HPI: Dawn [name] is a 35 y.o. female with history of sickle cell disease. She presents today for a pain crisis that started 2 days ago on 05/29/16. She reports pain 7/10 in her lower back and legs. She has her last MS 15 at 7 am and her last oxycodone also at 7am. She reports nothing different about this episode. Her last admission was at [facility] on 04/28/16. She denies chest pain, cough, SOB. No N/V/D, no abd pain, no dysuria, no fever or chils. She reports taking her hydrea and folic acid regularly.   Systemic Review: General: The patient denies anorexia, fever, weight loss Cardiac: Denies chest pain, syncope, palpitations, pedal edema  Respiratory: Denies cough, shortness of breath, wheezing GI: Denies severe indigestion/heartburn, abdominal pain, nausea, vomiting, diarrhea and constipation GU: Denies hematuria, incontinence, dysuria  Musculoskeletal: Denies arthritis  Skin: Denies suspicious skin lesions Neurologic: Denies focal weakness or numbness, change in vision  Past Medical History:  Diagnosis Date  . Blood transfusion   . Demand ischemia ([place]) 02/06/2016  . HCAP (healthcare-associated pneumonia) 05/19/2013  . 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 Pulmonary hypertension ([place]) 02/06/2016   49 mmHg per echo on 02/06/16   . 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 Sickle cell anemia (HCC)   . Sickle cell disease ([place])   . 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 Vitamin B12 deficiency 07/07/2015    Past Surgical History:  Procedure Laterality Date  .  left knee ACL reconstruction    . CESAREAN SECTION     x 2  . CHOLECYSTECTOMY    . PORT-A-CATH REMOVAL Left 07/29/2013   Procedure: REMOVAL PORT-A-CATH;  Surgeon: Scherry Ran, MD;  Location: [facility] ORS;  Service: General;  Laterality: Left;   . PORTACATH PLACEMENT    . PORTACATH PLACEMENT Left 02/23/2013   Procedure: INSERTION PORT-A-CATH;  Surgeon: Donato Heinz, MD;  Location: [facility] ORS;  Service: General;  Laterality: Left;  . TEE WITHOUT CARDIOVERSION N/A 08/07/2013   Procedure: TRANSESOPHAGEAL ECHOCARDIOGRAM (TEE);  Surgeon: Arnoldo Lenis, MD;  Location: [facility] END[facility] SUITE;  Service: Cardiology;  Laterality: N/A;    Allergies  Allergen Reactions  . Desferal [Deferoxamine] Hives    Local reaction on arm only during infusion. Can take with benadryl   . Latex Other (See Comments)    REACTION: Pt experiences a burning sensation on contacted skin areas  . Lisinopril Other (See Comments) and Cough    REACTION: Sore/scratchy throat  . Tape Other (See Comments)    REACTION: Pt. Experiences a burning sensation on contacted skin areas    Family History  Problem Relation Age of Onset  . Sickle cell trait Mother   . Sickle cell trait Father   . Hypertension Father   . Diabetes Father   . Sickle cell trait Sister   . Cancer Paternal Aunt     Breast      Prior to Admission medications   Medication Sig Start Date End Date Taking? Authorizing Provider  folic acid (FOLVITE) 1 MG tablet Take 1 tablet (1 mg total) by mouth daily. 03/14/16  Yes [name], FNP  hydroxyurea (HYDREA) 500 MG capsule Take 2 capsules (1,000 mg total) by mouth daily. May take with food to minimize GI side effects. 03/14/16  Yes Asencion Partridge  Hollis, FNP  loratadine (CLARITIN) 10 MG tablet Take 10 mg by mouth daily.   Yes Historical Provider, MD  morphine (MS CONTIN) 15 MG 12 hr tablet Take 1 tablet (15 mg total) by mouth every 12 (twelve) hours. 03/01/16  Yes [name], FNP  Multiple Vitamin (MULTIVITAMIN WITH MINERALS) TABS tablet Take 1 tablet by mouth daily.   Yes Historical Provider, MD  Oxycodone HCl 10 MG TABS Take 1 tablet (10 mg total) by mouth every 4 (four) hours as needed (breakthrough pain). 04/06/16  Yes Micheline Chapman, NP   promethazine (PHENERGAN) 25 MG tablet Take 1 tablet (25 mg total) by mouth every 6 (six) hours as needed for nausea or vomiting. 04/06/16  Yes Micheline Chapman, NP  Vitamin D, Ergocalciferol, (DRISDOL) 50000 UNITS CAPS capsule Take 1 capsule (50,000 Units total) by mouth every 7 (seven) days. Takes on Sundays. 08/16/15  Yes [name], FNP  acetaminophen (TYLENOL) 500 MG tablet Take 500 mg by mouth every 6 (six) hours as needed for mild pain.     Historical Provider, MD  hydrOXYzine (ATARAX/VISTARIL) 10 MG tablet TAKE 1 TABLET BY MOUTH 3 TIMES A DAY AS NEEDED FOR ITCHING 03/11/16   Historical Provider, MD  ibuprofen (ADVIL,MOTRIN) 200 MG tablet Take 200 mg by mouth every 6 (six) hours as needed for mild pain or moderate pain.    Historical Provider, MD  levonorgestrel (MIRENA) 20 MCG/24HR IUD 1 each by Intrauterine route once.    Historical Provider, MD     Physical Exam: Vitals:   05/31/16 1008 05/31/16 1051  BP: 131/80 129/87  Pulse: 85 78  Resp: 18 13  Temp: 98.8 F (37.1 C)   TempSrc: Oral   SpO2: 99% 98%    General: Alert, awake, afebrile, anicteric, not in obvious distress HEENT: Normocephalic and Atraumatic, Mucous membranes pink                PERRLA; EOM intact; No scleral icterus,                 Nares: Patent, Oropharynx: Clear, Fair Dentition                 Neck: FROM, no cervical lymphadenopathy, thyromegaly, carotid bruit or JVD;  CHEST WALL: No tenderness  CHEST: Normal respiration, clear to auscultation bilaterally  HEART: Regular rate and rhythm; no murmurs rubs or gallops  BACK: No kyphosis or scoliosis; no CVA tenderness  ABDOMEN: Positive Bowel Sounds, soft, non-tender; no masses, no organomegaly EXTREMITIES: No cyanosis, clubbing, or edema SKIN:  no rash or ulceration  CNS: Alert and Oriented x 4, Nonfocal exam, CN 2-12 intact  Labs on Admission:  Basic Metabolic Panel: No results for input(s): NA, K, CL, CO2, GLUCOSE, BUN, CREATININE, CALCIUM, MG,  PHOS in the last 168 hours. Liver Function Tests: No results for input(s): AST, ALT, ALKPHOS, BILITOT, PROT, ALBUMIN in the last 168 hours. No results for input(s): LIPASE, AMYLASE in the last 168 hours. No results for input(s): AMMONIA in the last 168 hours. CBC: No results for input(s): WBC, NEUTROABS, HGB, HCT, MCV, PLT in the last 168 hours. Cardiac Enzymes: No results for input(s): CKTOTAL, CKMB, CKMBINDEX, TROPONINI in the last 168 hours.  BNP (last 3 results) No results for input(s): BNP in the last 8760 hours.  ProBNP (last 3 results) No results for input(s): PROBNP in the last 8760 hours.  CBG: No results for input(s): GLUCAP in the last 168 hours.   Assessment/Plan Active Problems:  Sickle cell crisis ([place])   Admits to the Day Hospital  IVF D5 .45% Saline @ 125 mls/hour  Weight based Dilaudid PCA started within 30 minutes of admission  IV Toradol 30 mg Q 6 H  Monitor vitals very closely, Re-evaluate pain scale every hour  2 L of Oxygen by [place]  Patient will be re-evaluated for pain in the context of function and relationship to baseline as care progresses.  If no significant relieve from pain (remains above 5/10) will transfer patient to inpatient services for further evaluation and management  Code Status: Full  Family Communication: None  DVT Prophylaxis: Ambulate as tolerated   Time spent: 35 Minutes  BERNHARDT, LINDA,  If 7PM-7AM, please contact night-coverage [email or web address] 05/31/2016, 11:22 AM   Evaluation and management procedures were performed by the Advanced Practitioner under my supervision and collaboration. I have reviewed the Advanced Practitioner's note and chart, and I agree with the management and plan.   Angelica Chessman, MD, [place], [place], [place], [place] and [place], [place]   06/01/2016, 11:40 AM

## 2016-05-31 NOTE — Progress Notes (Signed)
Pt discharged to home; discharge instructions explained, given, and signed; all questions answered; pt alert, oriented, ambulatory; no complications noted. 

## 2016-05-31 NOTE — Telephone Encounter (Signed)
Patient C/O pain to legs and back that is 7/10 on pain scale.  Patient denies N/V/D, abdominal pain or chest pain.  Patient states she has taken her home medication without improvement.  Placed caller on hold and spoke with NP.  Advised patient she could come to [facility].  Patient verbalizes understanding.

## 2016-05-31 NOTE — Discharge Summary (Signed)
PCP: Angelica Chessman, MD  Admit date: 05/31/2016 Discharge date: 05/31/2016  Time spent: 35 minutes   Discharge Diagnoses:  Active Problems:   Sickle cell crisis [facility])   Discharge Condition: Stable Diet recommendation: Regular  There were no vitals filed for this visit.  History of present illness:   Dawn [name] is a 35 y.o. female with history of sickle cell disease. She presents today for a pain crisis that started 2 days ago on 05/29/16. She reports pain 7/10 in her lower back and legs. She has her last MS 15 at 7 am and her last oxycodone also at 7am. She reports nothing different about this episode. Her last admission was at [facility] on 04/28/16. She denies chest pain, cough, SOB. No N/V/D, no abd pain, no dysuria, no fever or chils. She reports taking her hydrea and folic acid regularly.  Hospital Course:  Patient was admitted to the day hospital with sickle cell painful crisis. She was treated with IV Dilaudid PCA, Toradol as well as IV fluids. She improved symptomatically and her pain went down from 8 out of 10 down to 4 out of 10 at the time of discharge. She is to follow-up in the clinic as previously scheduled. And she is to continue with her home medications as per prior to admission.  Discharge Exam: Vitals:   05/31/16 1258 05/31/16 1452  BP: 130/75 (!) 141/79  Pulse: 76 74  Resp: 18 17  Temp:      General appearance: alert, cooperative and no distress Eyes: conjunctivae/corneas clear. PERRL, EOM's intact. Fundi benign. Neck: no adenopathy, no carotid bruit, no JVD, supple, symmetrical, trachea midline and thyroid not enlarged, symmetric, no tenderness/mass/nodules Back: symmetric, no curvature. ROM normal. No CVA tenderness. Resp: clear to auscultation bilaterally Chest wall: no tenderness Cardio: regular rate and rhythm, S1, S2 normal, no murmur, click, rub or gallop GI: soft, non-tender; bowel sounds normal; no masses, no organomegaly Extremities:  extremities normal, atraumatic, no cyanosis or edema Pulses: 2+ and symmetric Skin: Skin color, texture, turgor normal. No rashes or lesions Neurologic: Grossly normal  Discharge Instructions  Continue home medications. Follow-up in clinic next week.     Current Discharge Medication List    CONTINUE these medications which have NOT CHANGED   Details  folic acid (FOLVITE) 1 MG tablet Take 1 tablet (1 mg total) by mouth daily. 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 May take with food to minimize GI side effects. Qty: 60 capsule, Refills: 11   Associated Diagnoses: Hb-SS disease without crisis (HCC)    loratadine (CLARITIN) 10 MG tablet Take 10 mg by mouth daily.    morphine (MS CONTIN) 15 MG 12 hr tablet Take 1 tablet (15 mg total) by mouth every 12 (twelve) hours. Qty: 60 tablet, Refills: 0   Associated Diagnoses: Hb-SS disease without crisis ([place])    Multiple Vitamin (MULTIVITAMIN WITH MINERALS) TABS tablet Take 1 tablet by mouth daily.    Oxycodone HCl 10 MG TABS Take 1 tablet (10 mg total) by mouth every 4 (four) hours as needed (breakthrough pain). Qty: 60 tablet, Refills: 0   Associated Diagnoses: Hb-SS disease without crisis (HCC)    promethazine (PHENERGAN) 25 MG tablet Take 1 tablet (25 mg total) by mouth every 6 (six) hours as needed for nausea or vomiting. Qty: 20 tablet, Refills: 0  Vitamin D, Ergocalciferol, (DRISDOL) 50000 UNITS CAPS capsule Take 1 capsule (50,000 Units total) by mouth every 7 (seven) days. Takes on Sundays. Qty: 4 capsule, Refills: 11   Associated Diagnoses: Vitamin D deficiency    acetaminophen (TYLENOL) 500 MG tablet Take 500 mg by mouth every 6 (six) hours as needed for mild pain.     hydrOXYzine (ATARAX/VISTARIL) 10 MG tablet TAKE 1 TABLET BY MOUTH 3 TIMES A DAY AS NEEDED FOR ITCHING Refills: 0     ibuprofen (ADVIL,MOTRIN) 200 MG tablet Take 200 mg by mouth every 6 (six) hours as needed for mild pain or moderate pain.    levonorgestrel (MIRENA) 20 MCG/24HR IUD 1 each by Intrauterine route once.       Allergies  Allergen Reactions  . Desferal [Deferoxamine] Hives    Local reaction on arm only during infusion. Can take with benadryl   . Latex Other (See Comments)    REACTION: Pt experiences a burning sensation on contacted skin areas  . Lisinopril Other (See Comments) and Cough    REACTION: Sore/scratchy throat  . Tape Other (See Comments)    REACTION: Pt. Experiences a burning sensation on contacted skin areas      The results of significant diagnostics from this hospitalization (including imaging, microbiology, ancillary and laboratory) are listed below for reference.    Significant Diagnostic Studies: No results found.  Microbiology: No results found for this or any previous visit (from the past 240 hour(s)).   Labs: Basic Metabolic Panel: No results for input(s): NA, K, CL, CO2, GLUCOSE, BUN, CREATININE, CALCIUM, MG, PHOS in the last 168 hours. Liver Function Tests: No results for input(s): AST, ALT, ALKPHOS, BILITOT, PROT, ALBUMIN in the last 168 hours. No results for input(s): LIPASE, AMYLASE in the last 168 hours. No results for input(s): AMMONIA in the last 168 hours. CBC:  Recent Labs Lab 05/31/16 1045  WBC 5.2  NEUTROABS 2.3  HGB 8.5*  HCT 23.1*  MCV 97.1  PLT 121*   Cardiac Enzymes: No results for input(s): CKTOTAL, CKMB, CKMBINDEX, TROPONINI in the last 168 hours. BNP: BNP (last 3 results) No results for input(s): BNP in the last 8760 hours.  ProBNP (last 3 results) No results for input(s): PROBNP in the last 8760 hours.  CBG: No results for input(s): GLUCAP in the last 168 hours.     Signed:  Sharon Seller MSN,FNP,BC

## 2016-05-31 NOTE — Discharge Instructions (Signed)
Sickle Cell Anemia, Adult Sickle cell anemia is a condition in which red blood cells have an abnormal "sickle" shape. This abnormal shape shortens the cells' life span, which results in a lower than normal concentration of red blood cells in the blood. The sickle shape also causes the cells to clump together and block free blood flow through the blood vessels. As a result, the tissues and organs of the body do not receive enough oxygen. Sickle cell anemia causes organ damage and pain and increases the risk of infection. CAUSES  Sickle cell anemia is a genetic disorder. Those who receive two copies of the gene have the condition, and those who receive one copy have the trait. RISK FACTORS The sickle cell gene is most common in people whose families originated in [place]. Other areas of the globe where sickle cell trait occurs include the [place], [place] and [place], the [place], and the [place].  SIGNS AND SYMPTOMS  Pain, especially in the extremities, back, chest, or abdomen (common). The pain may start suddenly or may develop following an illness, especially if there is dehydration. Pain can also occur due to overexertion or exposure to extreme temperature changes.  Frequent severe bacterial infections, especially certain types of pneumonia and meningitis.  Pain and swelling in the hands and feet.  Decreased activity.   Loss of appetite.   Change in behavior.  Headaches.  Seizures.  Shortness of breath or difficulty breathing.  Vision changes.  Skin ulcers. Those with the trait may not have symptoms or they may have mild symptoms.  DIAGNOSIS  Sickle cell anemia is diagnosed with blood tests that demonstrate the genetic trait. It is often diagnosed during the newborn period, due to mandatory testing nationwide. A variety of blood tests, X-rays, CT scans, MRI scans, ultrasounds, and lung function tests may also be done to monitor the condition. TREATMENT  Sickle  cell anemia may be treated with:  Medicines. You may be given pain medicines, antibiotic medicines (to treat and prevent infections) or medicines to increase the production of certain types of hemoglobin.  Fluids.  Oxygen.  Blood transfusions. HOME CARE INSTRUCTIONS   Drink enough fluid to keep your urine clear or pale yellow. Increase your fluid intake in hot weather and during exercise.  Do not smoke. Smoking lowers oxygen levels in the blood.   Only take over-the-counter or prescription medicines for pain, fever, or discomfort as directed by your health care provider.  Take antibiotics as directed by your health care provider. Make sure you finish them it even if you start to feel better.   Take supplements as directed by your health care provider.   Consider wearing a medical alert bracelet. This tells anyone caring for you in an emergency of your condition.   When traveling, keep your medical information, health care provider's names, and the medicines you take with you at all times.   If you develop a fever, do not take medicines to reduce the fever right away. This could cover up a problem that is developing. Notify your health care provider.  Keep all follow-up appointments with your health care provider. Sickle cell anemia requires regular medical care. SEEK MEDICAL CARE IF: You have a fever. SEEK IMMEDIATE MEDICAL CARE IF:   You feel dizzy or faint.   You have new abdominal pain, especially on the left side near the stomach area.   You develop a persistent, often uncomfortable and painful penile erection (priapism). If this is not treated immediately it   will lead to impotence.   You have numbness your arms or legs or you have a hard time moving them.   You have a hard time with speech.   You have a fever or persistent symptoms for more than 2-3 days.   You have a fever and your symptoms suddenly get worse.   You have signs or symptoms of infection.  These include:   Chills.   Abnormal tiredness (lethargy).   Irritability.   Poor eating.   Vomiting.   You develop pain that is not helped with medicine.   You develop shortness of breath.  You have pain in your chest.   You are coughing up pus-like or bloody sputum.   You develop a stiff neck.  Your feet or hands swell or have pain.  Your abdomen appears bloated.  You develop joint pain. MAKE SURE YOU:  Understand these instructions.   This information is not intended to replace advice given to you by your health care provider. Make sure you discuss any questions you have with your health care provider.   Document Released: 12/19/2005 Document Revised: 10/01/2014 Document Reviewed: 04/22/2013 Elsevier Interactive Patient Education 2016 [employer]  

## 2016-05-31 NOTE — Telephone Encounter (Signed)
Refill request for oxycodone please advise. Thanks!

## 2016-06-01 ENCOUNTER — Other Ambulatory Visit: Payer: Self-pay | Admitting: Family Medicine

## 2016-06-01 DIAGNOSIS — D571 Sickle-cell disease without crisis: Secondary | ICD-10-CM

## 2016-06-01 DIAGNOSIS — E559 Vitamin D deficiency, unspecified: Secondary | ICD-10-CM

## 2016-06-01 MED ORDER — PROMETHAZINE HCL 25 MG PO TABS: 25.0000 mg | ORAL_TABLET | Freq: Four times a day (QID) | ORAL | 0 refills | Status: DC | PRN

## 2016-06-01 MED ORDER — OXYCODONE HCL 10 MG PO TABS: 10.0000 mg | ORAL_TABLET | ORAL | 0 refills | Status: DC | PRN

## 2016-06-01 MED ORDER — VITAMIN D (ERGOCALCIFEROL) 1.25 MG (50000 UNIT) PO CAPS: 50000.0000 [IU] | ORAL_CAPSULE | ORAL | 11 refills | Status: AC

## 2016-06-05 ENCOUNTER — Encounter (HOSPITAL_COMMUNITY): Payer: Self-pay | Admitting: Emergency Medicine

## 2016-06-05 ENCOUNTER — Emergency Department (HOSPITAL_COMMUNITY)
Admission: EM | Admit: 2016-06-05 | Discharge: 2016-06-05 | Disposition: A | Discharge status: Home / Self Care | Payer: Medicare Other | Attending: Emergency Medicine | Admitting: Emergency Medicine

## 2016-06-05 DIAGNOSIS — Z79899 Other long term (current) drug therapy: Secondary | ICD-10-CM | POA: Insufficient documentation

## 2016-06-05 DIAGNOSIS — Z791 Long term (current) use of non-steroidal anti-inflammatories (NSAID): Secondary | ICD-10-CM | POA: Diagnosis not present

## 2016-06-05 DIAGNOSIS — D5700 Hb-SS disease with crisis, unspecified: Secondary | ICD-10-CM | POA: Diagnosis not present

## 2016-06-05 DIAGNOSIS — Z79891 Long term (current) use of opiate analgesic: Secondary | ICD-10-CM | POA: Diagnosis not present

## 2016-06-05 LAB — URINALYSIS, ROUTINE W REFLEX MICROSCOPIC
Bilirubin Urine: NEGATIVE
Glucose, UA: NEGATIVE mg/dL
Ketones, ur: NEGATIVE mg/dL
LEUKOCYTES UA: NEGATIVE
NITRITE: NEGATIVE
Protein, ur: 30 mg/dL — AB
pH: 6 (ref 5.0–8.0)

## 2016-06-05 LAB — CBC WITH DIFFERENTIAL/PLATELET
BASOS ABS: 0 10*3/uL (ref 0.0–0.1)
BASOS PCT: 0 %
EOS ABS: 0.1 10*3/uL (ref 0.0–0.7)
Eosinophils Relative: 1 %
HEMATOCRIT: 23.1 % — AB (ref 36.0–46.0)
HEMOGLOBIN: 8.2 g/dL — AB (ref 12.0–15.0)
LYMPHS PCT: 40 %
Lymphs Abs: 2.6 10*3/uL (ref 0.7–4.0)
MCH: 34.6 pg — ABNORMAL HIGH (ref 26.0–34.0)
MCHC: 35.5 g/dL (ref 30.0–36.0)
MCV: 97.5 fL (ref 78.0–100.0)
MONOS PCT: 14 %
Monocytes Absolute: 0.9 10*3/uL (ref 0.1–1.0)
NEUTROS PCT: 45 %
Neutro Abs: 2.9 10*3/uL (ref 1.7–7.7)
Platelets: 128 10*3/uL — ABNORMAL LOW (ref 150–400)
RBC: 2.37 MIL/uL — ABNORMAL LOW (ref 3.87–5.11)
RDW: 26 % — ABNORMAL HIGH (ref 11.5–15.5)
WBC: 6.5 10*3/uL (ref 4.0–10.5)

## 2016-06-05 LAB — COMPREHENSIVE METABOLIC PANEL
ALBUMIN: 3.1 g/dL — AB (ref 3.5–5.0)
ALK PHOS: 146 U/L — AB (ref 38–126)
ALT: 46 U/L (ref 14–54)
ANION GAP: 10 (ref 5–15)
AST: 116 U/L — ABNORMAL HIGH (ref 15–41)
BUN: 6 mg/dL (ref 6–20)
CO2: 23 mmol/L (ref 22–32)
Calcium: 9.2 mg/dL (ref 8.9–10.3)
Chloride: 103 mmol/L (ref 101–111)
Creatinine, Ser: 0.52 mg/dL (ref 0.44–1.00)
GFR calc non Af Amer: >60 mL/min (ref >60)
Glucose, Bld: 93 mg/dL (ref 65–99)
POTASSIUM: 3.6 mmol/L (ref 3.5–5.1)
SODIUM: 136 mmol/L (ref 135–145)
TOTAL PROTEIN: 7.9 g/dL (ref 6.5–8.1)
Total Bilirubin: 10.5 mg/dL — ABNORMAL HIGH (ref 0.3–1.2)

## 2016-06-05 LAB — URINE MICROSCOPIC-ADD ON
BACTERIA UA: NONE SEEN
WBC UA: NONE SEEN WBC/hpf (ref 0–5)

## 2016-06-05 MED ORDER — HYDROMORPHONE HCL 2 MG/ML IJ SOLN
2.0000 mg | INTRAMUSCULAR | Status: AC | PRN
  Administered 2016-06-05 (×3): 2 mg via INTRAVENOUS
  Filled 2016-06-05 (×3): qty 1

## 2016-06-05 MED ORDER — DIPHENHYDRAMINE HCL 12.5 MG/5ML PO ELIX
ORAL_SOLUTION | ORAL | Status: AC
  Filled 2016-06-05: qty 5

## 2016-06-05 MED ORDER — SODIUM CHLORIDE 0.9 % IV SOLN
INTRAVENOUS | Status: DC
  Administered 2016-06-05: 10:00:00 via INTRAVENOUS

## 2016-06-05 MED ORDER — DIPHENHYDRAMINE HCL 50 MG/ML IJ SOLN
12.5000 mg | Freq: Once | INTRAMUSCULAR | Status: AC
  Administered 2016-06-05: 12.5 mg via INTRAVENOUS
  Filled 2016-06-05: qty 1

## 2016-06-05 MED ORDER — ONDANSETRON HCL 4 MG/2ML IJ SOLN
4.0000 mg | Freq: Once | INTRAMUSCULAR | Status: AC
  Administered 2016-06-05: 4 mg via INTRAVENOUS
  Filled 2016-06-05: qty 2

## 2016-06-05 NOTE — ED Provider Notes (Signed)
[facility] DEPT Provider Note   CSN: [id number] Arrival date & time: 06/05/16  [id number]  By signing my name below, I, Shanna Cisco, attest that this documentation has been prepared under the direction and in the presence of Daleen Bo, MD. Electronically Signed: Shanna Cisco, ED Scribe. 06/05/16. 12:06 PM.  History   Chief Complaint Chief Complaint  Patient presents with  . Sickle Cell Pain Crisis   The history is provided by the patient. No language interpreter was used.   HPI Comments:  Dawn [name] is a 35 y.o. female with PMHx of sickle cell anemia who presents to the Emergency Department complaining of sickle cell pains, which started 1 week ago. Pt was seen at the day clinic 5 days ago and reports that symptoms improved with pain medication and fluid management. Pain localized to bilateral legs and arms and she believes pain was caused by weather changes. Associated symptoms include some yellowing of the eyes. She takes MS Contin and Oxycodone for pain management. Denies fever, emesis, chest pain and dysuria.   Past Medical History:  Diagnosis Date  . Blood transfusion   . Demand ischemia ([place]) 02/06/2016  . HCAP (healthcare-associated pneumonia) 05/19/2013  . 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 Pulmonary hypertension ([place]) 02/06/2016   49 mmHg per echo on 02/06/16   . 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 Sickle cell anemia (HCC)   . Sickle cell disease ([place])   . 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 Vitamin B12 deficiency 07/07/2015    Patient Active Problem List   Diagnosis Date Noted  . Positive D dimer   . Sickle cell anemia with pain ([place]) 04/10/2016  . Pulmonary hypertension ([place]) 02/16/2016  . 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 Pyrexia   . Sickle-cell/Hb-C disease with crisis ([place]) 02/13/2016  . Anemia 02/13/2016  . Demand ischemia ([place]) 02/13/2016  . Sepsis ([place]) 02/13/2016  . Fever 02/05/2016  . Tachycardia   . Anemia of chronic disease   .  Chronic pain syndrome 10/14/2015  . UTI (urinary tract infection) 08/25/2015  . 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 Vitamin B12 deficiency 07/07/2015  . Acute pulmonary edema ([place]) 07/06/2015  . Thrombocytopenia ([place]) 07/06/2015  . Acute respiratory failure with hypoxia ([place]) 09/12/2014  . Hyperbilirubinemia 09/11/2014  . Sickle cell disease with crisis ([place]) 09/09/2014  . Anxiety state 06/15/2014  . Nosebleed 06/15/2014  . Macrocytosis 06/13/2014  . Sickle cell crisis ([place]) 06/12/2014  . Sickle cell anemia with crisis ([place]) 05/24/2014  . Vitamin D deficiency 11/23/2013  . 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 Abnormal chest CT 08/07/2013  . Pain in the chest 08/03/2013  . Fungemia 07/28/2013  . Fever, unspecified 07/27/2013  . 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 Hypoxemia 05/14/2013  . 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 Hypokalemia 03/28/2012  . Hypoalbuminemia 03/28/2012  . 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 Leukocytosis 05/26/2011  . Elevated LFTs 04/27/2011  . Sickle cell anemia ([place]) 04/27/2011  . Sickle cell disease ([place]) 04/26/2011    Past Surgical History:  Procedure Laterality Date  .  left knee ACL reconstruction    . CESAREAN SECTION     x 2  . CHOLECYSTECTOMY    . PORT-A-CATH REMOVAL Left 07/29/2013   Procedure: REMOVAL PORT-A-CATH;  Surgeon: Scherry Ran, MD;  Location: [facility];  Service: General;  Laterality: Left;  . PORTACATH PLACEMENT    . PORTACATH PLACEMENT Left 02/23/2013   Procedure: INSERTION  PORT-A-CATH;  Surgeon: Donato Heinz, MD;  Location: [facility];  Service: General;  Laterality: Left;  . TEE WITHOUT CARDIOVERSION N/A 08/07/2013   Procedure: TRANSESOPHAGEAL ECHOCARDIOGRAM (TEE);  Surgeon: Arnoldo Lenis, MD;  Location: [facility];  Service: Cardiology;  Laterality: N/A;    OB History    Gravida Para Term Preterm AB Living   1 1   1   1    SAB TAB Ectopic Multiple  Live Births                   Home Medications    Prior to Admission medications   Medication Sig Start Date End Date Taking? Authorizing Provider  acetaminophen (TYLENOL) 500 MG tablet Take 500 mg by mouth every 6 (six) hours as needed for mild pain.     Historical Provider, MD  folic acid (FOLVITE) 1 MG tablet Take 1 tablet (1 mg total) by mouth daily. 03/14/16   [name], FNP  hydroxyurea (HYDREA) 500 MG capsule Take 2 capsules (1,000 mg total) by mouth daily. May take with food to minimize GI side effects. 03/14/16   [name], FNP  hydrOXYzine (ATARAX/VISTARIL) 10 MG tablet TAKE 1 TABLET BY MOUTH 3 TIMES A DAY AS NEEDED FOR ITCHING 03/11/16   Historical Provider, MD  ibuprofen (ADVIL,MOTRIN) 200 MG tablet Take 200 mg by mouth every 6 (six) hours as needed for mild pain or moderate pain.    Historical Provider, MD  levonorgestrel (MIRENA) 20 MCG/24HR IUD 1 each by Intrauterine route once.    Historical Provider, MD  loratadine (CLARITIN) 10 MG tablet Take 10 mg by mouth daily.    Historical Provider, MD  morphine (MS CONTIN) 15 MG 12 hr tablet Take 1 tablet (15 mg total) by mouth every 12 (twelve) hours. 03/01/16   [name], FNP  Multiple Vitamin (MULTIVITAMIN WITH MINERALS) TABS tablet Take 1 tablet by mouth daily.    Historical Provider, MD  Oxycodone HCl 10 MG TABS Take 1 tablet (10 mg total) by mouth every 4 (four) hours as needed (breakthrough pain). 06/01/16   Micheline Chapman, NP  promethazine (PHENERGAN) 25 MG tablet Take 1 tablet (25 mg total) by mouth every 6 (six) hours as needed for nausea or vomiting. 06/01/16   Micheline Chapman, NP  Vitamin D, Ergocalciferol, (DRISDOL) 50000 units CAPS capsule Take 1 capsule (50,000 Units total) by mouth every 7 (seven) days. Takes on Sundays. 06/01/16   Micheline Chapman, NP    Family History Family History  Problem Relation Age of Onset  . Sickle cell trait Mother   . Sickle cell trait Father   . Hypertension Father   .  Diabetes Father   . Sickle cell trait Sister   . Cancer Paternal Aunt     Breast    Social History Social History  Substance Use Topics  . Smoking status: Never Smoker  . Smokeless tobacco: Never Used  . Alcohol use No     Allergies   Desferal [deferoxamine]; Latex; Lisinopril; and Tape   Review of Systems Review of Systems  Constitutional: Negative for fever.  Cardiovascular: Negative for chest pain.  Gastrointestinal: Negative for vomiting.  Genitourinary: Negative for dysuria.  Musculoskeletal: Positive for myalgias.  All other systems reviewed and are negative.    Physical Exam Updated Vital Signs BP (!) 147/117   Pulse 102   Temp 98 F (36.7 C)   Resp 18   Ht 5\' 3"  (1.6 m)  Wt 164 lb (74.4 kg)   SpO2 95%   BMI 29.05 kg/m   Physical Exam  Constitutional: She is oriented to person, place, and time. She appears well-developed and well-nourished.  HENT:  Head: Normocephalic and atraumatic.  Eyes: Conjunctivae and EOM are normal. Pupils are equal, round, and reactive to light. Scleral icterus is present.  Neck: Normal range of motion and phonation normal. Neck supple.  Cardiovascular: Normal rate and regular rhythm.   Pulmonary/Chest: Effort normal and breath sounds normal. She exhibits no tenderness.  Abdominal: Soft. She exhibits no distension. There is no tenderness. There is no guarding.  Musculoskeletal: Normal range of motion. She exhibits no tenderness.  Neurological: She is alert and oriented to person, place, and time. She exhibits normal muscle tone.  Skin: Skin is warm and dry.  Psychiatric: She has a normal mood and affect. Her behavior is normal. Judgment and thought content normal.  Nursing note and vitals reviewed.    ED Treatments / Results  DIAGNOSTIC STUDIES:  Oxygen Saturation is 95% on room air, normal by my interpretation.    COORDINATION OF CARE:  10:08 AM Discussed treatment plan with pt at bedside, which includes urinalysis,  blood work, IV fluids, Dilaudid and zofran, and pt agreed to plan.  12:05 PM Pt reports that she's feeling better. Pain 5/10. Would like one more dose.  Labs (all labs ordered are listed, but only abnormal results are displayed) Labs Reviewed - No data to display  EKG  EKG Interpretation None       Radiology No results found.  Procedures Procedures (including critical care time)  Medications Ordered in ED Medications - No data to display   Initial Impression / Assessment and Plan / ED Course  I have reviewed the triage vital signs and the nursing notes.  Pertinent labs & imaging results that were available during my care of the patient were reviewed by me and considered in my medical decision making (see chart for details).  Clinical Course    Medications  HYDROmorphone (DILAUDID) injection 2 mg (2 mg Intravenous Given 06/05/16 1228)  ondansetron (ZOFRAN) injection 4 mg (4 mg Intravenous Given 06/05/16 1021)  diphenhydrAMINE (BENADRYL) injection 12.5 mg (12.5 mg Intravenous Given 06/05/16 1156)    Patient Vitals for the past 24 hrs:  BP Temp Temp src Pulse Resp SpO2 Height Weight  06/05/16 1231 141/74 98.4 F (36.9 C) Oral 92 16 94 % - -  06/05/16 0938 (!) 147/117 98 F (36.7 C) - 102 18 95 % - -  06/05/16 [id number] - - - - - - 5\' 3"  (1.6 m) 164 lb (74.4 kg)    At discharge- Reevaluation with update and discussion. After initial assessment and treatment, an updated evaluation reveals her pain is controlled. She has no further complaints. Findings discussed with patient and all questions answered. Dawn [name]    Final Clinical Impressions(s) / ED Diagnoses   Final diagnoses:  Sickle cell pain crisis (HCC)   Recurrent sickle cell crisis, mild hemolysis suspected. Doubt serious bacterial infection. Metabolic instability or impending vascular collapse. Symptoms controlled in the emergency department with 3 doses of narcotic analgesia.  Nursing Notes Reviewed/ Care  Coordinated Applicable Imaging Reviewed Interpretation of Laboratory Data incorporated into ED treatment  The patient appears reasonably screened and/or stabilized for discharge and I doubt any other medical condition or other [facility] requiring further screening, evaluation, or treatment in the ED at this time prior to discharge.  Plan: Home Medications- continue; Home Treatments- rest;  return here if the recommended treatment, does not improve the symptoms; Recommended follow up- PCP prn   New Prescriptions New Prescriptions   No medications on file  I personally performed the services described in this documentation, which was scribed in my presence. The recorded information has been reviewed and is accurate.     Daleen Bo, MD 06/06/16 [phone]

## 2016-06-05 NOTE — ED Triage Notes (Signed)
Pt c/o sickle cell pain x 1 week. Seen at day clinic last Thursday.

## 2016-06-07 ENCOUNTER — Encounter (HOSPITAL_COMMUNITY): Payer: Self-pay | Admitting: *Deleted

## 2016-06-07 ENCOUNTER — Emergency Department (HOSPITAL_COMMUNITY)
Admission: EM | Admit: 2016-06-07 | Discharge: 2016-06-08 | Disposition: A | Discharge status: Home / Self Care | Payer: Medicare Other | Attending: Emergency Medicine | Admitting: Emergency Medicine

## 2016-06-07 DIAGNOSIS — Z79899 Other long term (current) drug therapy: Secondary | ICD-10-CM | POA: Diagnosis not present

## 2016-06-07 DIAGNOSIS — D5700 Hb-SS disease with crisis, unspecified: Secondary | ICD-10-CM

## 2016-06-07 DIAGNOSIS — Z791 Long term (current) use of non-steroidal anti-inflammatories (NSAID): Secondary | ICD-10-CM | POA: Insufficient documentation

## 2016-06-07 DIAGNOSIS — M545 Low back pain: Secondary | ICD-10-CM | POA: Diagnosis present

## 2016-06-07 LAB — COMPREHENSIVE METABOLIC PANEL
ALT: 50 U/L (ref 14–54)
ANION GAP: 7 (ref 5–15)
AST: 139 U/L — AB (ref 15–41)
Albumin: 3.4 g/dL — ABNORMAL LOW (ref 3.5–5.0)
Alkaline Phosphatase: 158 U/L — ABNORMAL HIGH (ref 38–126)
BUN: 7 mg/dL (ref 6–20)
CHLORIDE: 104 mmol/L (ref 101–111)
CO2: 24 mmol/L (ref 22–32)
Calcium: 8.7 mg/dL — ABNORMAL LOW (ref 8.9–10.3)
Creatinine, Ser: 0.45 mg/dL (ref 0.44–1.00)
Glucose, Bld: 78 mg/dL (ref 65–99)
POTASSIUM: 4 mmol/L (ref 3.5–5.1)
SODIUM: 135 mmol/L (ref 135–145)
Total Bilirubin: 10.9 mg/dL — ABNORMAL HIGH (ref 0.3–1.2)
Total Protein: 8.8 g/dL — ABNORMAL HIGH (ref 6.5–8.1)

## 2016-06-07 MED ORDER — KETOROLAC TROMETHAMINE 30 MG/ML IJ SOLN
30.0000 mg | INTRAMUSCULAR | Status: AC
  Administered 2016-06-08: 30 mg via INTRAVENOUS
  Filled 2016-06-07: qty 1

## 2016-06-07 MED ORDER — HYDROMORPHONE HCL 2 MG/ML IJ SOLN: 0.0250 mg/kg | INTRAMUSCULAR | Status: AC

## 2016-06-07 MED ORDER — DIPHENHYDRAMINE HCL 50 MG/ML IJ SOLN
25.0000 mg | Freq: Once | INTRAMUSCULAR | Status: AC
  Administered 2016-06-08: 25 mg via INTRAVENOUS
  Filled 2016-06-07: qty 1

## 2016-06-07 MED ORDER — DEXTROSE-NACL 5-0.45 % IV SOLN
INTRAVENOUS | Status: DC
  Administered 2016-06-07: 125 mL/h via INTRAVENOUS

## 2016-06-07 MED ORDER — HYDROMORPHONE HCL 2 MG/ML IJ SOLN
0.0250 mg/kg | INTRAMUSCULAR | Status: AC
  Administered 2016-06-08: 1.9 mg via INTRAVENOUS
  Filled 2016-06-07: qty 1

## 2016-06-07 MED ORDER — PROMETHAZINE HCL 25 MG PO TABS
25.0000 mg | ORAL_TABLET | ORAL | Status: DC | PRN
  Administered 2016-06-08: 25 mg via ORAL
  Filled 2016-06-07: qty 1

## 2016-06-07 NOTE — ED Provider Notes (Signed)
[facility] DEPT Provider Note   CSN: [id number] Arrival date & time: 06/07/16  1846  By signing my name below, I, Gwenlyn Fudge, attest that this documentation has been prepared under the direction and in the presence of [place], [place]. Electronically Signed: Gwenlyn Fudge, ED Scribe. 06/07/16. 11:39 PM.  History   Chief Complaint Chief Complaint  Patient presents with  . Sickle Cell Pain Crisis   The history is provided by the patient. No language interpreter was used.   HPI Comments: Dawn [name] is a 35 y.o. female with PMHx of Sick Cell Disease who presents to the Emergency Department complaining of lower back and bilateral lower legs due to sickle cell pain.  Pt usually takes Oxy and MS Contin, but states inadequate relief of pain today. Denies chest pain, shortness of breath, fever, or chills.   Past Medical History:  Diagnosis Date  . Blood transfusion   . Demand ischemia ([place]) 02/06/2016  . HCAP (healthcare-associated pneumonia) 05/19/2013  . 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 Pulmonary hypertension ([place]) 02/06/2016   49 mmHg per echo on 02/06/16   . 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 Sickle cell anemia (HCC)   . Sickle cell disease ([place])   . 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 Vitamin B12 deficiency 07/07/2015    Patient Active Problem List   Diagnosis Date Noted  . Positive D dimer   . Sickle cell anemia with pain ([place]) 04/10/2016  . Pulmonary hypertension ([place]) 02/16/2016  . 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 Pyrexia   . Sickle-cell/Hb-C disease with crisis ([place]) 02/13/2016  . Anemia 02/13/2016  . Demand ischemia ([place]) 02/13/2016  . Sepsis ([place]) 02/13/2016  . Fever 02/05/2016  . Tachycardia   . Anemia of chronic disease   . Chronic pain syndrome 10/14/2015  . UTI (urinary tract infection) 08/25/2015  . 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 Vitamin B12 deficiency 07/07/2015  . Acute pulmonary edema ([place]) 07/06/2015  . Thrombocytopenia  ([place]) 07/06/2015  . Acute respiratory failure with hypoxia ([place]) 09/12/2014  . Hyperbilirubinemia 09/11/2014  . Sickle cell disease with crisis ([place]) 09/09/2014  . Anxiety state 06/15/2014  . Nosebleed 06/15/2014  . Macrocytosis 06/13/2014  . Sickle cell crisis ([place]) 06/12/2014  . Sickle cell anemia with crisis ([place]) 05/24/2014  . Vitamin D deficiency 11/23/2013  . 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 Abnormal chest CT 08/07/2013  . Pain in the chest 08/03/2013  . Fungemia 07/28/2013  . Fever, unspecified 07/27/2013  . 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 Hypoxemia 05/14/2013  . 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 Hypokalemia 03/28/2012  . Hypoalbuminemia 03/28/2012  . 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 Leukocytosis 05/26/2011  . Elevated LFTs 04/27/2011  . Sickle cell anemia ([place]) 04/27/2011  . Sickle cell disease ([place]) 04/26/2011    Past Surgical History:  Procedure Laterality Date  .  left knee ACL reconstruction    . CESAREAN SECTION     x 2  . CHOLECYSTECTOMY    . PORT-A-CATH REMOVAL Left 07/29/2013   Procedure: REMOVAL PORT-A-CATH;  Surgeon: Scherry Ran, MD;  Location: [facility];  Service: General;  Laterality: Left;  . PORTACATH PLACEMENT    . PORTACATH PLACEMENT Left 02/23/2013   Procedure: INSERTION PORT-A-CATH;  Surgeon: Donato Heinz, MD;  Location: [facility];  Service: General;  Laterality: Left;  . TEE WITHOUT CARDIOVERSION N/A 08/07/2013   Procedure: TRANSESOPHAGEAL ECHOCARDIOGRAM (TEE);  Surgeon: Arnoldo Lenis,  MD;  Location: [facility];  Service: Cardiology;  Laterality: N/A;    OB History    Gravida Para Term Preterm AB Living   1 1   1   1    SAB TAB Ectopic Multiple Live Births                   Home Medications    Prior to Admission medications   Medication Sig Start Date End Date Taking? Authorizing Provider  folic acid (FOLVITE) 1 MG tablet Take 1 tablet (1 mg total) by  mouth daily. 03/14/16  Yes [name], FNP  hydroxyurea (HYDREA) 500 MG capsule Take 2 capsules (1,000 mg total) by mouth daily. May take with food to minimize GI side effects. 03/14/16  Yes [name], FNP  levonorgestrel (MIRENA) 20 MCG/24HR IUD 1 each by Intrauterine route once.   Yes Historical Provider, MD  loratadine (CLARITIN) 10 MG tablet Take 10 mg by mouth daily.   Yes Historical Provider, MD  morphine (MS CONTIN) 15 MG 12 hr tablet Take 1 tablet (15 mg total) by mouth every 12 (twelve) hours. 03/01/16  Yes [name], FNP  Multiple Vitamin (MULTIVITAMIN WITH MINERALS) TABS tablet Take 1 tablet by mouth daily.   Yes Historical Provider, MD  Oxycodone HCl 10 MG TABS Take 1 tablet (10 mg total) by mouth every 4 (four) hours as needed (breakthrough pain). 06/01/16  Yes Micheline Chapman, NP  promethazine (PHENERGAN) 25 MG tablet Take 1 tablet (25 mg total) by mouth every 6 (six) hours as needed for nausea or vomiting. 06/01/16  Yes Micheline Chapman, NP  acetaminophen (TYLENOL) 500 MG tablet Take 500 mg by mouth every 6 (six) hours as needed for mild pain.     Historical Provider, MD  ibuprofen (ADVIL,MOTRIN) 200 MG tablet Take 200 mg by mouth every 6 (six) hours as needed for mild pain or moderate pain.    Historical Provider, MD  Vitamin D, Ergocalciferol, (DRISDOL) 50000 units CAPS capsule Take 1 capsule (50,000 Units total) by mouth every 7 (seven) days. Takes on Sundays. 06/01/16   Micheline Chapman, NP    Family History Family History  Problem Relation Age of Onset  . Sickle cell trait Mother   . Sickle cell trait Father   . Hypertension Father   . Diabetes Father   . Sickle cell trait Sister   . Cancer Paternal Aunt     Breast    Social History Social History  Substance Use Topics  . Smoking status: Never Smoker  . Smokeless tobacco: Never Used  . Alcohol use No     Allergies   Desferal [deferoxamine]; Latex; Lisinopril; and Tape   Review of Systems Review  of Systems  Constitutional: Negative for fever.  Respiratory: Negative for shortness of breath.   Cardiovascular: Negative for chest pain.  Musculoskeletal: Positive for arthralgias (Bilateral leg pain) and back pain.   Physical Exam Updated Vital Signs BP 126/72   Pulse 90   Temp 98.9 F (37.2 C) (Oral)   Resp 21   SpO2 91%   Physical Exam  Constitutional: She appears well-developed. She is active. No distress.  Eyes: Conjunctivae are normal.  Cardiovascular: Normal rate, regular rhythm and normal heart sounds.   Pulmonary/Chest: Effort normal. No respiratory distress. She has no wheezes. She has no rales. She exhibits no tenderness.  Abdominal: There is no tenderness.  Musculoskeletal:  No midline back tenderness No hip or leg tenderness  Neurological:  She is alert.  Skin: Skin is warm and dry.  Psychiatric: She has a normal mood and affect. Her behavior is normal.  Nursing note and vitals reviewed.  ED Treatments / Results  DIAGNOSTIC STUDIES: Oxygen Saturation is 91% on RA, low by my interpretation.    COORDINATION OF CARE: 11:40 PM Discussed treatment plan with pt at bedside which includes blood work, Dilaudid, Toradol, Benadryl, Phenergan and pt agreed to plan.  Labs (all labs ordered are listed, but only abnormal results are displayed) Labs Reviewed  COMPREHENSIVE METABOLIC PANEL - Abnormal; Notable for the following:       Result Value   Calcium 8.7 (*)    Total Protein 8.8 (*)    Albumin 3.4 (*)    AST 139 (*)    Alkaline Phosphatase 158 (*)    Total Bilirubin 10.9 (*)    All other components within normal limits  CBC WITH DIFFERENTIAL/PLATELET  RETICULOCYTES    EKG  EKG Interpretation None      Radiology No results found.  Procedures Procedures (including critical care time)  Medications Ordered in ED Medications - No data to display   Initial Impression / Assessment and Plan / ED Course  I have reviewed the triage vital signs and the  nursing notes.  Pertinent labs & imaging results that were available during my care of the patient were reviewed by me and considered in my medical decision making (see chart for details).  Clinical Course    No evidence of aplastic crisis. Sickle cell order set used in ordering pain medications. Pt reports resolution of pain. Doubt acute chest. Hemodynamically stable. Encouraged close PCP and [facility] follow up. ER return precautions given.  Final Clinical Impressions(s) / ED Diagnoses   Final diagnoses:  Sickle-cell disease with pain ([place])    New Prescriptions New Prescriptions   No medications on file   I personally performed the services described in this documentation, which was scribed in my presence. The recorded information has been reviewed and is accurate.    Anne Ng, PA-C 06/08/16 [id number]    Charlesetta Shanks, MD 06/12/16 [phone]

## 2016-06-07 NOTE — ED Triage Notes (Signed)
Pt states that she is having a sickle cell pain crisis; pt states that she has been having lower back and bilateral leg pain x 2 days; pt states that the pain has gotten progressively worse; pt states that she took Oxy IR 4 hrs ago without relief of pain

## 2016-06-08 MED ORDER — HYDROMORPHONE HCL 2 MG/ML IJ SOLN: 0.0313 mg/kg | INTRAMUSCULAR | Status: AC

## 2016-06-08 MED ORDER — HYDROMORPHONE HCL 2 MG/ML IJ SOLN
0.0313 mg/kg | INTRAMUSCULAR | Status: AC
  Administered 2016-06-08: 2.3 mg via INTRAVENOUS
  Filled 2016-06-08: qty 2

## 2016-06-08 MED ORDER — HYDROMORPHONE HCL 2 MG/ML IJ SOLN
0.0375 mg/kg | INTRAMUSCULAR | Status: AC
  Administered 2016-06-08: 2.8 mg via INTRAVENOUS
  Filled 2016-06-08: qty 2

## 2016-06-08 MED ORDER — HYDROMORPHONE HCL 2 MG/ML IJ SOLN: 0.0375 mg/kg | INTRAMUSCULAR | Status: AC

## 2016-06-08 NOTE — ED Notes (Signed)
Per pt request, pt given a ham sandwich and a sprite.

## 2016-06-21 ENCOUNTER — Encounter (HOSPITAL_COMMUNITY): Payer: Self-pay

## 2016-06-21 ENCOUNTER — Emergency Department (HOSPITAL_COMMUNITY)
Admission: EM | Admit: 2016-06-21 | Discharge: 2016-06-21 | Disposition: A | Discharge status: Home / Self Care | Payer: Medicare Other | Attending: Emergency Medicine | Admitting: Emergency Medicine

## 2016-06-21 DIAGNOSIS — Z791 Long term (current) use of non-steroidal anti-inflammatories (NSAID): Secondary | ICD-10-CM | POA: Insufficient documentation

## 2016-06-21 DIAGNOSIS — D5700 Hb-SS disease with crisis, unspecified: Secondary | ICD-10-CM | POA: Insufficient documentation

## 2016-06-21 DIAGNOSIS — Z79899 Other long term (current) drug therapy: Secondary | ICD-10-CM | POA: Insufficient documentation

## 2016-06-21 DIAGNOSIS — M545 Low back pain: Secondary | ICD-10-CM | POA: Diagnosis present

## 2016-06-21 LAB — RETICULOCYTES
RBC.: 2.49 MIL/uL — AB (ref 3.87–5.11)
RETIC COUNT ABSOLUTE: 413.3 10*3/uL — AB (ref 19.0–186.0)
RETIC CT PCT: 16.6 % — AB (ref 0.4–3.1)

## 2016-06-21 LAB — COMPREHENSIVE METABOLIC PANEL
ALT: 45 U/L (ref 14–54)
ANION GAP: 4 — AB (ref 5–15)
AST: 107 U/L — ABNORMAL HIGH (ref 15–41)
Albumin: 2.8 g/dL — ABNORMAL LOW (ref 3.5–5.0)
Alkaline Phosphatase: 138 U/L — ABNORMAL HIGH (ref 38–126)
BILIRUBIN TOTAL: 9.4 mg/dL — AB (ref 0.3–1.2)
BUN: 6 mg/dL (ref 6–20)
CHLORIDE: 110 mmol/L (ref 101–111)
CO2: 23 mmol/L (ref 22–32)
Calcium: 8.4 mg/dL — ABNORMAL LOW (ref 8.9–10.3)
Creatinine, Ser: 0.51 mg/dL (ref 0.44–1.00)
GFR calc Af Amer: >60 mL/min (ref >60)
Glucose, Bld: 105 mg/dL — ABNORMAL HIGH (ref 65–99)
POTASSIUM: 3.7 mmol/L (ref 3.5–5.1)
Sodium: 137 mmol/L (ref 135–145)
Total Protein: 7.6 g/dL (ref 6.5–8.1)

## 2016-06-21 LAB — CBC WITH DIFFERENTIAL/PLATELET
BASOS ABS: 0 10*3/uL (ref 0.0–0.1)
BASOS PCT: 1 %
EOS PCT: 1 %
Eosinophils Absolute: 0 10*3/uL (ref 0.0–0.7)
HEMATOCRIT: 25.2 % — AB (ref 36.0–46.0)
Hemoglobin: 9 g/dL — ABNORMAL LOW (ref 12.0–15.0)
LYMPHS PCT: 33 %
Lymphs Abs: 1.9 10*3/uL (ref 0.7–4.0)
MCH: 35 pg — ABNORMAL HIGH (ref 26.0–34.0)
MCHC: 35.7 g/dL (ref 30.0–36.0)
MCV: 98.1 fL (ref 78.0–100.0)
MONO ABS: 1 10*3/uL (ref 0.1–1.0)
MONOS PCT: 17 %
NEUTROS ABS: 2.9 10*3/uL (ref 1.7–7.7)
Neutrophils Relative %: 49 %
PLATELETS: 287 10*3/uL (ref 150–400)
RBC: 2.57 MIL/uL — ABNORMAL LOW (ref 3.87–5.11)
RDW: 24.4 % — AB (ref 11.5–15.5)
WBC: 5.9 10*3/uL (ref 4.0–10.5)

## 2016-06-21 LAB — URINALYSIS, ROUTINE W REFLEX MICROSCOPIC
GLUCOSE, UA: NEGATIVE mg/dL
LEUKOCYTES UA: NEGATIVE
NITRITE: NEGATIVE
PROTEIN: 100 mg/dL — AB
Specific Gravity, Urine: 1.015 (ref 1.005–1.030)
pH: 6 (ref 5.0–8.0)

## 2016-06-21 LAB — PREGNANCY, URINE: PREG TEST UR: NEGATIVE

## 2016-06-21 LAB — URINE MICROSCOPIC-ADD ON

## 2016-06-21 MED ORDER — ONDANSETRON HCL 4 MG/2ML IJ SOLN
4.0000 mg | INTRAMUSCULAR | Status: DC | PRN
  Administered 2016-06-21: 4 mg via INTRAVENOUS
  Filled 2016-06-21: qty 2

## 2016-06-21 MED ORDER — HYDROMORPHONE HCL 2 MG/ML IJ SOLN
0.0250 mg/kg | INTRAMUSCULAR | Status: AC
  Administered 2016-06-21: 1.9 mg via INTRAVENOUS
  Filled 2016-06-21: qty 1

## 2016-06-21 MED ORDER — HYDROMORPHONE HCL 2 MG/ML IJ SOLN
2.0000 mg | Freq: Once | INTRAMUSCULAR | Status: AC
  Administered 2016-06-21: 2 mg via INTRAVENOUS
  Filled 2016-06-21: qty 1

## 2016-06-21 MED ORDER — PROMETHAZINE HCL 12.5 MG PO TABS
25.0000 mg | ORAL_TABLET | Freq: Once | ORAL | Status: AC
Start: 2016-06-21 — End: 2016-06-21
  Administered 2016-06-21: 25 mg via ORAL
  Filled 2016-06-21: qty 2

## 2016-06-21 MED ORDER — KETOROLAC TROMETHAMINE 30 MG/ML IJ SOLN
30.0000 mg | INTRAMUSCULAR | Status: AC
  Administered 2016-06-21: 30 mg via INTRAVENOUS
  Filled 2016-06-21: qty 1

## 2016-06-21 MED ORDER — HYDROMORPHONE HCL 2 MG/ML IJ SOLN: 0.0250 mg/kg | INTRAMUSCULAR | Status: AC

## 2016-06-21 MED ORDER — SODIUM CHLORIDE 0.45 % IV SOLN
INTRAVENOUS | Status: DC
  Administered 2016-06-21: 10:00:00 via INTRAVENOUS

## 2016-06-21 MED ORDER — HYDROMORPHONE HCL 1 MG/ML IJ SOLN
1.0000 mg | Freq: Once | INTRAMUSCULAR | Status: AC
  Administered 2016-06-21: 1 mg via INTRAVENOUS
  Filled 2016-06-21: qty 1

## 2016-06-21 MED ORDER — SODIUM CHLORIDE 0.9 % IV SOLN
Freq: Once | INTRAVENOUS | Status: AC
Start: 2016-06-21 — End: 2016-06-21
  Administered 2016-06-21: 14:00:00 via INTRAVENOUS

## 2016-06-21 MED ORDER — DIPHENHYDRAMINE HCL 50 MG/ML IJ SOLN
25.0000 mg | Freq: Once | INTRAMUSCULAR | Status: AC
  Administered 2016-06-21: 25 mg via INTRAVENOUS
  Filled 2016-06-21: qty 1

## 2016-06-21 NOTE — ED Notes (Signed)
Pt given Sprite and saltines per request.

## 2016-06-21 NOTE — ED Notes (Signed)
EDP at bedside updating patient. 

## 2016-06-21 NOTE — ED Triage Notes (Signed)
Complain of pain in back and legs

## 2016-06-21 NOTE — ED Provider Notes (Signed)
[facility] DEPT Provider Note   CSN: [id number] Arrival date & time: 06/21/16  [id number]     History   Chief Complaint Chief Complaint  Patient presents with  . Back Pain    leg pain    HPI Dawn [name] is a 35 y.o. female.  The history is provided by the patient. No language interpreter was used.  Back Pain   This is a new problem. The current episode started 6 to 12 hours ago. The problem occurs constantly. The problem has not changed since onset.The pain is associated with no known injury. The pain is present in the lumbar spine. The pain does not radiate. The pain is moderate. The pain is worse during the day. Pertinent negatives include no fever. She has tried nothing for the symptoms. The treatment provided no relief.  Pt complains of sickle cell pain.  Pt reports she fells like this is a typical contraction.    Past Medical History:  Diagnosis Date  . Blood transfusion   . Demand ischemia ([place]) 02/06/2016  . HCAP (healthcare-associated pneumonia) 05/19/2013  . 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 Pulmonary hypertension ([place]) 02/06/2016   49 mmHg per echo on 02/06/16   . 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 Sickle cell anemia (HCC)   . Sickle cell disease ([place])   . 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 Vitamin B12 deficiency 07/07/2015    Patient Active Problem List   Diagnosis Date Noted  . Positive D dimer   . Sickle cell anemia with pain ([place]) 04/10/2016  . Pulmonary hypertension ([place]) 02/16/2016  . 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 Pyrexia   . 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 Anemia 02/13/2016  . Demand ischemia ([place]) 02/13/2016  . Sepsis ([place]) 02/13/2016  . Fever 02/05/2016  . Tachycardia   . Anemia of chronic disease   . Chronic pain syndrome 10/14/2015  . UTI (urinary tract infection) 08/25/2015  . 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 Vitamin B12 deficiency 07/07/2015  . Acute pulmonary edema ([place]) 07/06/2015    . Thrombocytopenia ([place]) 07/06/2015  . Acute respiratory failure with hypoxia ([place]) 09/12/2014  . Hyperbilirubinemia 09/11/2014  . Sickle cell disease with crisis ([place]) 09/09/2014  . Anxiety state 06/15/2014  . Nosebleed 06/15/2014  . Macrocytosis 06/13/2014  . Sickle cell crisis ([place]) 06/12/2014  . Sickle cell anemia with crisis ([place]) 05/24/2014  . Vitamin D deficiency 11/23/2013  . 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 Abnormal chest CT 08/07/2013  . Pain in the chest 08/03/2013  . Fungemia 07/28/2013  . Fever, unspecified 07/27/2013  . 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 Hypoxemia 05/14/2013  . 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 Hypokalemia 03/28/2012  . Hypoalbuminemia 03/28/2012  . 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 Leukocytosis 05/26/2011  . Elevated LFTs 04/27/2011  . Sickle cell anemia ([place]) 04/27/2011  . Sickle cell disease ([place]) 04/26/2011    Past Surgical History:  Procedure Laterality Date  .  left knee ACL reconstruction    . CESAREAN SECTION     x 2  . CHOLECYSTECTOMY    . PORT-A-CATH REMOVAL Left 07/29/2013   Procedure: REMOVAL PORT-A-CATH;  Surgeon: Scherry Ran, MD;  Location: [facility];  Service: General;  Laterality: Left;  . PORTACATH PLACEMENT    . PORTACATH PLACEMENT Left 02/23/2013   Procedure: INSERTION PORT-A-CATH;  Surgeon: Donato Heinz, MD;  Location: [facility];  Service: General;  Laterality: Left;  .  TEE WITHOUT CARDIOVERSION N/A 08/07/2013   Procedure: TRANSESOPHAGEAL ECHOCARDIOGRAM (TEE);  Surgeon: Arnoldo Lenis, MD;  Location: [facility];  Service: Cardiology;  Laterality: N/A;    OB History    Gravida Para Term Preterm AB Living   1 1   1   1    SAB TAB Ectopic Multiple Live Births                   Home Medications    Prior to Admission medications   Medication Sig Start Date End Date Taking? Authorizing Provider  acetaminophen (TYLENOL) 500 MG tablet Take  500 mg by mouth every 6 (six) hours as needed for mild pain.    Yes Historical Provider, MD  folic acid (FOLVITE) 1 MG tablet Take 1 tablet (1 mg total) by mouth daily. 03/14/16  Yes [name], FNP  hydroxyurea (HYDREA) 500 MG capsule Take 2 capsules (1,000 mg total) by mouth daily. May take with food to minimize GI side effects. 03/14/16  Yes [name], FNP  ibuprofen (ADVIL,MOTRIN) 200 MG tablet Take 200 mg by mouth every 6 (six) hours as needed for mild pain or moderate pain.   Yes Historical Provider, MD  levonorgestrel (MIRENA) 20 MCG/24HR IUD 1 each by Intrauterine route once.   Yes Historical Provider, MD  loratadine (CLARITIN) 10 MG tablet Take 10 mg by mouth daily.   Yes Historical Provider, MD  morphine (MS CONTIN) 15 MG 12 hr tablet Take 1 tablet (15 mg total) by mouth every 12 (twelve) hours. 03/01/16  Yes [name], FNP  Multiple Vitamin (MULTIVITAMIN WITH MINERALS) TABS tablet Take 1 tablet by mouth daily.   Yes Historical Provider, MD  Oxycodone HCl 10 MG TABS Take 1 tablet (10 mg total) by mouth every 4 (four) hours as needed (breakthrough pain). 06/01/16  Yes Micheline Chapman, NP  promethazine (PHENERGAN) 25 MG tablet Take 1 tablet (25 mg total) by mouth every 6 (six) hours as needed for nausea or vomiting. 06/01/16  Yes Micheline Chapman, NP  Vitamin D, Ergocalciferol, (DRISDOL) 50000 units CAPS capsule Take 1 capsule (50,000 Units total) by mouth every 7 (seven) days. Takes on Sundays. 06/01/16  Yes Micheline Chapman, NP    Family History Family History  Problem Relation Age of Onset  . Sickle cell trait Mother   . Sickle cell trait Father   . Hypertension Father   . Diabetes Father   . Sickle cell trait Sister   . Cancer Paternal Aunt     Breast    Social History Social History  Substance Use Topics  . Smoking status: Never Smoker  . Smokeless tobacco: Never Used  . Alcohol use No     Allergies   Desferal [deferoxamine]; Latex; Lisinopril; and  Tape   Review of Systems Review of Systems  Constitutional: Negative for fever.  Musculoskeletal: Positive for back pain.  All other systems reviewed and are negative.    Physical Exam Updated Vital Signs BP 137/75   Pulse 85   Temp 98.6 F (37 C) (Oral)   Resp 16   Ht 5\' 3"  (1.6 m)   Wt 75.3 kg   SpO2 94%   BMI 29.41 kg/m   Physical Exam  Constitutional: She appears well-developed and well-nourished. No distress.  HENT:  Head: Normocephalic and atraumatic.  Right Ear: External ear normal.  Left Ear: External ear normal.  Nose: Nose normal.  Mouth/Throat: Oropharynx is clear and moist.  Eyes: Conjunctivae  are normal.  Neck: Neck supple.  Cardiovascular: Normal rate and regular rhythm.   No murmur heard. Pulmonary/Chest: Effort normal and breath sounds normal. No respiratory distress.  Abdominal: Soft. There is no tenderness.  Musculoskeletal: She exhibits no edema.  Neurological: She is alert.  Skin: Skin is warm and dry.  Psychiatric: She has a normal mood and affect.  Nursing note and vitals reviewed.  Pt has a history of sickle cell anemia. Pt reports her crisses are normally managed well by outpatient treatment.    ED Treatments / Results  Labs (all labs ordered are listed, but only abnormal results are displayed) Labs Reviewed  CBC WITH DIFFERENTIAL/PLATELET - Abnormal; Notable for the following:       Result Value   RBC 2.57 (*)    Hemoglobin 9.0 (*)    HCT 25.2 (*)    MCH 35.0 (*)    RDW 24.4 (*)    All other components within normal limits  COMPREHENSIVE METABOLIC PANEL - Abnormal; Notable for the following:    Glucose, Bld 105 (*)    Calcium 8.4 (*)    Albumin 2.8 (*)    AST 107 (*)    Alkaline Phosphatase 138 (*)    Total Bilirubin 9.4 (*)    Anion gap 4 (*)    All other components within normal limits  RETICULOCYTES - Abnormal; Notable for the following:    Retic Ct Pct 16.6 (*)    RBC. 2.49 (*)    Retic Count, Manual 413.3 (*)    All  other components within normal limits  PREGNANCY, URINE  URINALYSIS, ROUTINE W REFLEX MICROSCOPIC (NOT AT [facility])    EKG  EKG Interpretation None       Radiology No results found.  Procedures Procedures (including critical care time)  Medications Ordered in ED Medications  0.45 % sodium chloride infusion ( Intravenous New Bag/Given 06/21/16 0942)  ondansetron (ZOFRAN) injection 4 mg (not administered)  ketorolac (TORADOL) 30 MG/ML injection 30 mg (30 mg Intravenous Given 06/21/16 0943)  HYDROmorphone (DILAUDID) injection 1.9 mg (1.9 mg Intravenous Given 06/21/16 0943)    Or  HYDROmorphone (DILAUDID) injection 1.9 mg ( Subcutaneous See Alternative 06/21/16 0943)  diphenhydrAMINE (BENADRYL) injection 25 mg (25 mg Intravenous Given 06/21/16 0943)  promethazine (PHENERGAN) tablet 25 mg (25 mg Oral Given 06/21/16 0958)  HYDROmorphone (DILAUDID) injection 2 mg (2 mg Intravenous Given 06/21/16 1150)     Initial Impression / Assessment and Plan / ED Course  I have reviewed the triage vital signs and the nursing notes.  Pertinent labs & imaging results that were available during my care of the patient were reviewed by me and considered in my medical decision making (see chart for details).  Clinical Course    Pt given Iv fluids, pain medications,  Pt reports feeling much better.  Pt advised if symptoms return worsen or change  Go to [facility] long for evaluation.    Final Clinical Impressions(s) / ED Diagnoses   Final diagnoses:  Sickle cell crisis [facility])    New Prescriptions Discharge Medication List as of 06/21/2016  2:37 PM       Fransico Meadow, PA-C 06/21/16 1538    Merrily Pew, MD 06/22/16 [phone]

## 2016-07-03 ENCOUNTER — Emergency Department (HOSPITAL_COMMUNITY)
Admission: EM | Admit: 2016-07-03 | Discharge: 2016-07-03 | Disposition: A | Discharge status: Home / Self Care | Payer: Medicare Other | Attending: Emergency Medicine | Admitting: Emergency Medicine

## 2016-07-03 ENCOUNTER — Encounter (HOSPITAL_COMMUNITY): Payer: Self-pay | Admitting: Emergency Medicine

## 2016-07-03 DIAGNOSIS — I272 Pulmonary hypertension, unspecified: Secondary | ICD-10-CM | POA: Insufficient documentation

## 2016-07-03 DIAGNOSIS — D5700 Hb-SS disease with crisis, unspecified: Secondary | ICD-10-CM | POA: Insufficient documentation

## 2016-07-03 DIAGNOSIS — Z79899 Other long term (current) drug therapy: Secondary | ICD-10-CM | POA: Insufficient documentation

## 2016-07-03 LAB — CBC WITH DIFFERENTIAL/PLATELET
BLASTS: 0 %
Band Neutrophils: 0 %
Basophils Absolute: 0 10*3/uL (ref 0.0–0.1)
Basophils Relative: 0 %
EOS ABS: 0.1 10*3/uL (ref 0.0–0.7)
Eosinophils Relative: 1 %
HEMATOCRIT: 22.2 % — AB (ref 36.0–46.0)
HEMOGLOBIN: 8.1 g/dL — AB (ref 12.0–15.0)
LYMPHS PCT: 36 %
Lymphs Abs: 2.5 10*3/uL (ref 0.7–4.0)
MCH: 35.7 pg — AB (ref 26.0–34.0)
MCHC: 36.5 g/dL — AB (ref 30.0–36.0)
MCV: 97.8 fL (ref 78.0–100.0)
MYELOCYTES: 0 %
Metamyelocytes Relative: 0 %
Monocytes Absolute: 0.2 10*3/uL (ref 0.1–1.0)
Monocytes Relative: 3 %
NEUTROS PCT: 60 %
NRBC: 0 /100{WBCs}
Neutro Abs: 4.1 10*3/uL (ref 1.7–7.7)
OTHER: 0 %
PROMYELOCYTES ABS: 0 %
Platelets: 139 10*3/uL — ABNORMAL LOW (ref 150–400)
RBC: 2.27 MIL/uL — ABNORMAL LOW (ref 3.87–5.11)
RDW: 24 % — ABNORMAL HIGH (ref 11.5–15.5)
WBC: 6.9 10*3/uL (ref 4.0–10.5)

## 2016-07-03 LAB — COMPREHENSIVE METABOLIC PANEL
ALK PHOS: 142 U/L — AB (ref 38–126)
ALT: 52 U/L (ref 14–54)
ANION GAP: 6 (ref 5–15)
AST: 135 U/L — ABNORMAL HIGH (ref 15–41)
Albumin: 3 g/dL — ABNORMAL LOW (ref 3.5–5.0)
BILIRUBIN TOTAL: 13.2 mg/dL — AB (ref 0.3–1.2)
BUN: 6 mg/dL (ref 6–20)
CALCIUM: 8.7 mg/dL — AB (ref 8.9–10.3)
CO2: 24 mmol/L (ref 22–32)
Chloride: 105 mmol/L (ref 101–111)
Creatinine, Ser: 0.56 mg/dL (ref 0.44–1.00)
Glucose, Bld: 79 mg/dL (ref 65–99)
Potassium: 3.7 mmol/L (ref 3.5–5.1)
SODIUM: 135 mmol/L (ref 135–145)
TOTAL PROTEIN: 8.1 g/dL (ref 6.5–8.1)

## 2016-07-03 MED ORDER — DIPHENHYDRAMINE HCL 50 MG/ML IJ SOLN
25.0000 mg | Freq: Once | INTRAMUSCULAR | Status: AC
  Administered 2016-07-03: 25 mg via INTRAVENOUS
  Filled 2016-07-03: qty 1

## 2016-07-03 MED ORDER — HYDROMORPHONE HCL 2 MG/ML IJ SOLN
2.0000 mg | Freq: Once | INTRAMUSCULAR | Status: AC
  Administered 2016-07-03: 2 mg via INTRAVENOUS
  Filled 2016-07-03: qty 1

## 2016-07-03 MED ORDER — ONDANSETRON HCL 4 MG/2ML IJ SOLN
4.0000 mg | INTRAMUSCULAR | Status: DC | PRN
  Administered 2016-07-03: 4 mg via INTRAVENOUS
  Filled 2016-07-03: qty 2

## 2016-07-03 MED ORDER — HYDROMORPHONE HCL 2 MG/ML IJ SOLN
2.0000 mg | INTRAMUSCULAR | Status: AC
  Administered 2016-07-03 (×3): 2 mg via INTRAVENOUS
  Filled 2016-07-03 (×3): qty 1

## 2016-07-03 MED ORDER — KETOROLAC TROMETHAMINE 30 MG/ML IJ SOLN
30.0000 mg | Freq: Once | INTRAMUSCULAR | Status: AC
  Administered 2016-07-03: 30 mg via INTRAVENOUS
  Filled 2016-07-03: qty 1

## 2016-07-03 MED ORDER — PROMETHAZINE HCL 25 MG/ML IJ SOLN
25.0000 mg | Freq: Once | INTRAMUSCULAR | Status: AC
  Administered 2016-07-03: 25 mg via INTRAVENOUS
  Filled 2016-07-03: qty 1

## 2016-07-03 MED ORDER — DEXTROSE-NACL 5-0.45 % IV SOLN
INTRAVENOUS | Status: DC
  Administered 2016-07-03: 13:00:00 via INTRAVENOUS

## 2016-07-03 MED ORDER — MORPHINE SULFATE (PF) 4 MG/ML IV SOLN: 4.0000 mg | Freq: Once | INTRAVENOUS | Status: DC

## 2016-07-03 NOTE — Discharge Instructions (Signed)
As discussed, your evaluation today has been largely reassuring.  But, it is important that you monitor your condition carefully, and do not hesitate to return to the ED if you develop new, or concerning changes in your condition. ? ?Otherwise, please follow-up with your physician for appropriate ongoing care. ? ?

## 2016-07-03 NOTE — ED Triage Notes (Signed)
Onset leg and back pain Sunday, home meds are not taking care of the pain.

## 2016-07-03 NOTE — ED Notes (Signed)
Pt's room air o2 sat 92%.  EDP aware, ok to d/c pt.

## 2016-07-03 NOTE — ED Provider Notes (Signed)
[facility] DEPT Provider Note   CSN: [id number] Arrival date & time: 07/03/16  [id number]  By signing my name below, I, Dawn [name], attest that this documentation has been prepared under the direction and in the presence of [name], MD . Electronically Signed: [name], Scribe. 07/03/2016. 12:30 PM.  History   Chief Complaint Chief Complaint  Patient presents with  . Sickle Cell Pain Crisis   The history is provided by the patient. No language interpreter was used.   HPI Comments: VIA FANSLER is a 35 y.o. female with PMHx of sickle cell anemia, who presents to the Emergency Department complaining of gradually worsening, leg and back pain that began 2 days ago and worsened today. Pt reports her current pain is consistent with her typical sickle cell pain. She states has taken oxycodone as she usually does for her pain with no relief. 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 Pt denies chest pain, shortness of breath, abdominal pain, headache, confusion and syncope. She also denies recent long travel and sudden changes in appetite.   Past Medical History:  Diagnosis Date  . Blood transfusion   . Demand ischemia ([place]) 02/06/2016  . HCAP (healthcare-associated pneumonia) 05/19/2013  . 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 Pulmonary hypertension 02/06/2016   49 mmHg per echo on 02/06/16   . 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 Sickle cell anemia (HCC)   . Sickle cell disease ([place])   . Sickle cell disease, type S ([place])   . Vitamin B12 deficiency 07/07/2015    Patient Active Problem List   Diagnosis Date Noted  . Positive D dimer   . Sickle cell anemia with pain ([place]) 04/10/2016  . Pulmonary hypertension 02/16/2016  . 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 Pyrexia   . Sickle-cell/Hb-C disease with crisis ([place]) 02/13/2016  . Anemia 02/13/2016  . Demand ischemia ([place]) 02/13/2016  . Sepsis ([place]) 02/13/2016  .  Fever 02/05/2016  . Tachycardia   . Anemia of chronic disease   . Chronic pain syndrome 10/14/2015  . UTI (urinary tract infection) 08/25/2015  . 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 Vitamin B12 deficiency 07/07/2015  . Acute pulmonary edema ([place]) 07/06/2015  . Thrombocytopenia ([place]) 07/06/2015  . Acute respiratory failure with hypoxia ([place]) 09/12/2014  . Hyperbilirubinemia 09/11/2014  . Sickle cell disease with crisis ([place]) 09/09/2014  . Anxiety state 06/15/2014  . Nosebleed 06/15/2014  . Macrocytosis 06/13/2014  . Sickle cell crisis ([place]) 06/12/2014  . Sickle cell anemia with crisis ([place]) 05/24/2014  . Vitamin D deficiency 11/23/2013  . 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 Abnormal chest CT 08/07/2013  . Pain in the chest 08/03/2013  . Fungemia 07/28/2013  . Fever, unspecified 07/27/2013  . 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 Hypoxemia 05/14/2013  . 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 Hypokalemia 03/28/2012  . Hypoalbuminemia 03/28/2012  . 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 Leukocytosis 05/26/2011  . Elevated LFTs 04/27/2011  . Sickle cell anemia ([place]) 04/27/2011  . Sickle cell disease ([place]) 04/26/2011    Past Surgical History:  Procedure Laterality Date  .  left knee ACL reconstruction    . CESAREAN SECTION     x 2  . CHOLECYSTECTOMY    . PORT-A-CATH REMOVAL Left 07/29/2013   Procedure: REMOVAL PORT-A-CATH;  Surgeon: Scherry Ran, MD;  Location: [facility];  Service: General;  Laterality: Left;  . PORTACATH PLACEMENT    .  PORTACATH PLACEMENT Left 02/23/2013   Procedure: INSERTION PORT-A-CATH;  Surgeon: Donato Heinz, MD;  Location: [facility];  Service: General;  Laterality: Left;  . TEE WITHOUT CARDIOVERSION N/A 08/07/2013   Procedure: TRANSESOPHAGEAL ECHOCARDIOGRAM (TEE);  Surgeon: Arnoldo Lenis, MD;  Location: [facility];  Service: Cardiology;  Laterality: N/A;    OB History    Gravida Para  Term Preterm AB Living   1 1   1   1    SAB TAB Ectopic Multiple Live Births                   Home Medications    Prior to Admission medications   Medication Sig Start Date End Date Taking? Authorizing Provider  acetaminophen (TYLENOL) 500 MG tablet Take 500 mg by mouth every 6 (six) hours as needed for mild pain.     Historical Provider, MD  folic acid (FOLVITE) 1 MG tablet Take 1 tablet (1 mg total) by mouth daily. 03/14/16   [name], FNP  hydroxyurea (HYDREA) 500 MG capsule Take 2 capsules (1,000 mg total) by mouth daily. May take with food to minimize GI side effects. 03/14/16   [name], FNP  ibuprofen (ADVIL,MOTRIN) 200 MG tablet Take 200 mg by mouth every 6 (six) hours as needed for mild pain or moderate pain.    Historical Provider, MD  levonorgestrel (MIRENA) 20 MCG/24HR IUD 1 each by Intrauterine route once.    Historical Provider, MD  loratadine (CLARITIN) 10 MG tablet Take 10 mg by mouth daily.    Historical Provider, MD  morphine (MS CONTIN) 15 MG 12 hr tablet Take 1 tablet (15 mg total) by mouth every 12 (twelve) hours. 03/01/16   [name], FNP  Multiple Vitamin (MULTIVITAMIN WITH MINERALS) TABS tablet Take 1 tablet by mouth daily.    Historical Provider, MD  Oxycodone HCl 10 MG TABS Take 1 tablet (10 mg total) by mouth every 4 (four) hours as needed (breakthrough pain). 06/01/16   Micheline Chapman, NP  promethazine (PHENERGAN) 25 MG tablet Take 1 tablet (25 mg total) by mouth every 6 (six) hours as needed for nausea or vomiting. 06/01/16   Micheline Chapman, NP  Vitamin D, Ergocalciferol, (DRISDOL) 50000 units CAPS capsule Take 1 capsule (50,000 Units total) by mouth every 7 (seven) days. Takes on Sundays. 06/01/16   Micheline Chapman, NP    Family History Family History  Problem Relation Age of Onset  . Sickle cell trait Mother   . Sickle cell trait Father   . Hypertension Father   . Diabetes Father   . Sickle cell trait Sister   . Cancer Paternal Aunt      Breast    Social History Social History  Substance Use Topics  . Smoking status: Never Smoker  . Smokeless tobacco: Never Used  . Alcohol use No     Allergies   Desferal [deferoxamine]; Latex; Lisinopril; and Tape   Review of Systems Review of Systems  Constitutional: Negative for chills and fever.  HENT: Negative.   Eyes:       Scleral icterus - normal for pain episodes per patient  Respiratory: Negative for shortness of breath.   Cardiovascular: Negative for chest pain.  Gastrointestinal: Negative for abdominal pain.  Genitourinary: Negative.   Musculoskeletal: Positive for arthralgias and back pain.  Skin: Positive for color change.  Allergic/Immunologic: Positive for immunocompromised state.  Neurological: Negative for syncope and headaches.  Psychiatric/Behavioral: Negative for confusion.  Physical Exam Updated Vital Signs BP 139/84 (BP Location: Right Arm)   Pulse 88   Temp 98.7 F (37.1 C) (Oral)   Resp 18   Ht 5\' 3"  (1.6 m)   Wt 166 lb (75.3 kg)   SpO2 95%   BMI 29.41 kg/m   Physical Exam  Constitutional: She is oriented to person, place, and time. She appears well-developed and well-nourished. No distress.  HENT:  Head: Normocephalic and atraumatic.  Eyes: Conjunctivae and EOM are normal. Scleral icterus is present.  Cardiovascular: Normal rate and regular rhythm.   Pulmonary/Chest: Effort normal and breath sounds normal. No stridor. No respiratory distress.  Abdominal: She exhibits no distension.  Musculoskeletal: She exhibits no edema.  Neurological: She is alert and oriented to person, place, and time. No cranial nerve deficit.  Skin: Skin is warm and dry.  Psychiatric: She has a normal mood and affect.  Nursing note and vitals reviewed.    ED Treatments / Results  Labs (all labs ordered are listed, but only abnormal results are displayed) Labs Reviewed  COMPREHENSIVE METABOLIC PANEL - Abnormal; Notable for the following:        Result Value   Calcium 8.7 (*)    Albumin 3.0 (*)    AST 135 (*)    Alkaline Phosphatase 142 (*)    Total Bilirubin 13.2 (*)    All other components within normal limits  CBC WITH DIFFERENTIAL/PLATELET     Procedures Procedures (including critical care time)  Medications Ordered in ED Medications  dextrose 5 %-0.45 % sodium chloride infusion ( Intravenous New Bag/Given 07/03/16 1308)  ondansetron (ZOFRAN) injection 4 mg (4 mg Intravenous Given 07/03/16 1305)  HYDROmorphone (DILAUDID) injection 2 mg (2 mg Intravenous Given 07/03/16 1306)  diphenhydrAMINE (BENADRYL) injection 25 mg (25 mg Intravenous Given 07/03/16 1305)    DIAGNOSTIC STUDIES:  Oxygen Saturation is 95% on RA, normal by my interpretation.    COORDINATION OF CARE:  12:34 PM Discussed treatment [name] with pt at bedside and pt agreed to [name].  Initial Impression / Assessment and [name] / ED Course  I have reviewed the triage vital signs and the nursing notes.  Pertinent labs & imaging results that were available during my care of the patient were reviewed by me and considered in my medical decision making (see chart for details).   I personally performed the services described in this documentation, which was scribed in my presence. The recorded information has been reviewed and is accurate.    Young female presents with concern of ongoing pain in multiple areas. Pain is in a distribution consistent with prior sickle cell episodes labs consistent with multiple prior evaluations. Pain improved with fluids, analgesia, antiemetics, antihistamine. Patient required multiple doses of narcotics, but with this improvement, otherwise reassuring findings, she was discharged to follow-up with primary care.[name], MD 07/03/16 [phone]

## 2016-07-06 ENCOUNTER — Encounter (HOSPITAL_COMMUNITY): Payer: Self-pay | Admitting: *Deleted

## 2016-07-06 ENCOUNTER — Telehealth (HOSPITAL_COMMUNITY): Payer: Self-pay | Admitting: Hematology

## 2016-07-06 ENCOUNTER — Non-Acute Institutional Stay (HOSPITAL_COMMUNITY)
Admission: AD | Admit: 2016-07-06 | Discharge: 2016-07-06 | Disposition: A | Discharge status: Home / Self Care | Payer: Medicare Other | Source: Ambulatory Visit | Attending: Internal Medicine | Admitting: Internal Medicine

## 2016-07-06 DIAGNOSIS — Z79899 Other long term (current) drug therapy: Secondary | ICD-10-CM | POA: Insufficient documentation

## 2016-07-06 DIAGNOSIS — D5700 Hb-SS disease with crisis, unspecified: Secondary | ICD-10-CM | POA: Diagnosis present

## 2016-07-06 DIAGNOSIS — Z79891 Long term (current) use of opiate analgesic: Secondary | ICD-10-CM | POA: Diagnosis not present

## 2016-07-06 DIAGNOSIS — R52 Pain, unspecified: Secondary | ICD-10-CM | POA: Diagnosis present

## 2016-07-06 LAB — CBC WITH DIFFERENTIAL/PLATELET
BASOS ABS: 0 10*3/uL (ref 0.0–0.1)
BASOS PCT: 0 %
EOS ABS: 0.1 10*3/uL (ref 0.0–0.7)
EOS PCT: 2 %
HCT: 20.8 % — ABNORMAL LOW (ref 36.0–46.0)
Hemoglobin: 7.7 g/dL — ABNORMAL LOW (ref 12.0–15.0)
Lymphocytes Relative: 29 %
Lymphs Abs: 2.7 10*3/uL (ref 0.7–4.0)
MCH: 36.5 pg — ABNORMAL HIGH (ref 26.0–34.0)
MCHC: 37 g/dL — ABNORMAL HIGH (ref 30.0–36.0)
MCV: 98.6 fL (ref 78.0–100.0)
Monocytes Absolute: 1.4 10*3/uL — ABNORMAL HIGH (ref 0.1–1.0)
Monocytes Relative: 15 %
NEUTROS PCT: 54 %
Neutro Abs: 5.1 10*3/uL (ref 1.7–7.7)
PLATELETS: 161 10*3/uL (ref 150–400)
RBC: 2.11 MIL/uL — AB (ref 3.87–5.11)
RDW: 25 % — AB (ref 11.5–15.5)
WBC: 9.4 10*3/uL (ref 4.0–10.5)

## 2016-07-06 MED ORDER — PROMETHAZINE HCL 25 MG PO TABS
25.0000 mg | ORAL_TABLET | ORAL | Status: DC | PRN
  Administered 2016-07-06: 25 mg via ORAL
  Filled 2016-07-06: qty 1

## 2016-07-06 MED ORDER — POLYETHYLENE GLYCOL 3350 17 G PO PACK: 17.0000 g | PACK | Freq: Every day | ORAL | Status: DC | PRN

## 2016-07-06 MED ORDER — ONDANSETRON HCL 4 MG/2ML IJ SOLN: 4.0000 mg | Freq: Four times a day (QID) | INTRAMUSCULAR | Status: DC | PRN

## 2016-07-06 MED ORDER — SODIUM CHLORIDE 0.9 % IV SOLN
25.0000 mg | INTRAVENOUS | Status: DC | PRN
  Filled 2016-07-06: qty 0.5

## 2016-07-06 MED ORDER — DIPHENHYDRAMINE HCL 25 MG PO CAPS: 25.0000 mg | ORAL_CAPSULE | ORAL | Status: DC | PRN

## 2016-07-06 MED ORDER — NALOXONE HCL 0.4 MG/ML IJ SOLN: 0.4000 mg | INTRAMUSCULAR | Status: DC | PRN

## 2016-07-06 MED ORDER — SODIUM CHLORIDE 0.9% FLUSH: 9.0000 mL | INTRAVENOUS | Status: DC | PRN

## 2016-07-06 MED ORDER — SENNOSIDES-DOCUSATE SODIUM 8.6-50 MG PO TABS: 1.0000 | ORAL_TABLET | Freq: Two times a day (BID) | ORAL | Status: DC

## 2016-07-06 MED ORDER — HYDROMORPHONE HCL 2 MG/ML IJ SOLN
2.0000 mg | INTRAMUSCULAR | Status: DC | PRN
  Administered 2016-07-06 (×2): 2 mg via INTRAVENOUS
  Filled 2016-07-06 (×2): qty 1

## 2016-07-06 MED ORDER — HYDROMORPHONE 1 MG/ML IV SOLN
INTRAVENOUS | Status: DC
  Administered 2016-07-06: 11:00:00 via INTRAVENOUS
  Administered 2016-07-06: 11.4 mg via INTRAVENOUS
  Filled 2016-07-06: qty 25

## 2016-07-06 MED ORDER — KETOROLAC TROMETHAMINE 30 MG/ML IJ SOLN
30.0000 mg | Freq: Four times a day (QID) | INTRAMUSCULAR | Status: DC
  Administered 2016-07-06: 30 mg via INTRAVENOUS
  Filled 2016-07-06: qty 1

## 2016-07-06 MED ORDER — DEXTROSE-NACL 5-0.45 % IV SOLN
INTRAVENOUS | Status: DC
  Administered 2016-07-06: 11:00:00 via INTRAVENOUS

## 2016-07-06 NOTE — Telephone Encounter (Signed)
Patient C/O pain to legs and back.  Patient rates pain 8/10 on pain scale.  Patient denies chest pain or shortness of breath.  Patient denies abdominal pain or N/V/D, fever.  Patient took MS contin at 0700, oxycodone at 0800.  Patient states she has been taking it as scheduled for about 3 days without improvement.  I advised I would notify the provider and give her a call back.  Patient verbalizes understanding.

## 2016-07-06 NOTE — H&P (Signed)
[facility] History and Physical  Dawn [name] [id number] DOB: [birth year] DOA: 07/06/2016  PCP: Angelica Chessman, MD   Chief Complaint: Pain  HPI: Dawn [name] is a 35 y.o. female with history of sickle cell disease. She presents to the day hospital today for a pain crisis that started 3 days ago with pain in her legs and lower back rated at 8/10, constant, aching and throbbing. She was in the ED on 07/03/2016 for the same pain. She took her last MS 15 at 7 am and her last oxycodone also at 8am. She reports nothing different about this episode. She denies chest pain, cough, SOB. No N/V/D, no abd pain, no dysuria, no fever or chils. She reports taking her hydrea and folic acid regularly.   Systemic Review: General: The patient denies anorexia, fever, weight loss Cardiac: Denies chest pain, syncope, palpitations, pedal edema  Respiratory: Denies cough, shortness of breath, wheezing GI: Denies severe indigestion/heartburn, abdominal pain, nausea, vomiting, diarrhea and constipation GU: Denies hematuria, incontinence, dysuria  Musculoskeletal: Denies arthritis  Skin: Denies suspicious skin lesions Neurologic: Denies focal weakness or numbness, change in vision  Past Medical History:  Diagnosis Date  . Blood transfusion   . Demand ischemia ([place]) 02/06/2016  . HCAP (healthcare-associated pneumonia) 05/19/2013  . 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 Pulmonary hypertension 02/06/2016   49 mmHg per echo on 02/06/16   . 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 Sickle cell anemia (HCC)   . Sickle cell disease ([place])   . Sickle cell disease, type S ([place])   . Vitamin B12 deficiency 07/07/2015    Past Surgical History:  Procedure Laterality Date  .  left knee ACL reconstruction    . CESAREAN SECTION     x 2  . CHOLECYSTECTOMY    . PORT-A-CATH REMOVAL Left 07/29/2013   Procedure: REMOVAL PORT-A-CATH;  Surgeon: Scherry Ran, MD;  Location: [facility] ORS;  Service: General;   Laterality: Left;  . PORTACATH PLACEMENT    . PORTACATH PLACEMENT Left 02/23/2013   Procedure: INSERTION PORT-A-CATH;  Surgeon: Donato Heinz, MD;  Location: [facility] ORS;  Service: General;  Laterality: Left;  . TEE WITHOUT CARDIOVERSION N/A 08/07/2013   Procedure: TRANSESOPHAGEAL ECHOCARDIOGRAM (TEE);  Surgeon: Arnoldo Lenis, MD;  Location: [facility] END[facility] SU[facility];  Service: Cardiology;  Laterality: N/A;    Allergies  Allergen Reactions  . Desferal [Deferoxamine] Hives    Local reaction on arm only during infusion. Can take with benadryl   . Latex Other (See Comments)    REACTION: Pt experiences a burning sensation on contacted skin areas  . Lisinopril Other (See Comments) and Cough    REACTION: Sore/scratchy throat  . Tape Other (See Comments)    REACTION: Pt. Experiences a burning sensation on contacted skin areas    Family History  Problem Relation Age of Onset  . Sickle cell trait Mother   . Sickle cell trait Father   . Hypertension Father   . Diabetes Father   . Sickle cell trait Sister   . Cancer Paternal Aunt     Breast      Prior to Admission medications   Medication Sig Start Date End Date Taking? Authorizing Provider  acetaminophen (TYLENOL) 500 MG tablet Take 500 mg by mouth every 6 (six) hours as needed for mild pain.     Historical Provider, MD  folic acid (FOLVITE) 1 MG tablet Take 1 tablet (1 mg total) by mouth daily. 03/14/16   [name],  FNP  hydroxyurea (HYDREA) 500 MG capsule Take 2 capsules (1,000 mg total) by mouth daily. May take with food to minimize GI side effects. 03/14/16   [name], FNP  ibuprofen (ADVIL,MOTRIN) 200 MG tablet Take 200 mg by mouth every 6 (six) hours as needed for mild pain or moderate pain.    Historical Provider, MD  levonorgestrel (MIRENA) 20 MCG/24HR IUD 1 each by Intrauterine route once.    Historical Provider, MD  loratadine (CLARITIN) 10 MG tablet Take 10 mg by mouth daily.    Historical Provider, MD  morphine (MS CONTIN)  15 MG 12 hr tablet Take 1 tablet (15 mg total) by mouth every 12 (twelve) hours. 03/01/16   [name], FNP  Multiple Vitamin (MULTIVITAMIN WITH MINERALS) TABS tablet Take 1 tablet by mouth daily.    Historical Provider, MD  Oxycodone HCl 10 MG TABS Take 1 tablet (10 mg total) by mouth every 4 (four) hours as needed (breakthrough pain). 06/01/16   Micheline Chapman, NP  promethazine (PHENERGAN) 25 MG tablet Take 1 tablet (25 mg total) by mouth every 6 (six) hours as needed for nausea or vomiting. 06/01/16   Micheline Chapman, NP  Vitamin D, Ergocalciferol, (DRISDOL) 50000 units CAPS capsule Take 1 capsule (50,000 Units total) by mouth every 7 (seven) days. Takes on Sundays. 06/01/16   Micheline Chapman, NP     Physical Exam: There were no vitals filed for this visit.  General: Alert, awake, afebrile, anicteric, not in obvious distress, mild icterus++ HEENT: Normocephalic and Atraumatic, Mucous membranes pink                PERRLA; EOM intact; No scleral icterus,                 Nares: Patent, Oropharynx: Clear, Fair Dentition                 Neck: FROM, no cervical lymphadenopathy, thyromegaly, carotid bruit or JVD  CHEST: Normal respiration, clear to auscultation bilaterally  HEART: Regular rate and rhythm; no murmurs rubs or gallops  BACK: No kyphosis or scoliosis; no CVA tenderness  ABDOMEN: Positive Bowel Sounds, soft, non-tender; no masses, no organomegaly EXTREMITIES: No cyanosis, clubbing, or edema SKIN:  no rash or ulceration  CNS: Alert and Oriented x 4, Nonfocal exam, CN 2-12 intact  Labs on Admission:  Basic Metabolic Panel:  Recent Labs Lab 07/03/16 1147  NA 135  K 3.7  CL 105  CO2 24  GLUCOSE 79  BUN 6  CREATININE 0.56  CALCIUM 8.7*   Liver Function Tests:  Recent Labs Lab 07/03/16 1147  AST 135*  ALT 52  ALKPHOS 142*  BILITOT 13.2*  PROT 8.1  ALBUMIN 3.0*   No results for input(s): LIPASE, AMYLASE in the last 168 hours. No results for input(s): AMMONIA  in the last 168 hours. CBC:  Recent Labs Lab 07/03/16 1147  WBC 6.9  NEUTROABS 4.1  HGB 8.1*  HCT 22.2*  MCV 97.8  PLT 139*   Cardiac Enzymes: No results for input(s): CKTOTAL, CKMB, CKMBINDEX, TROPONINI in the last 168 hours.  BNP (last 3 results) No results for input(s): BNP in the last 8760 hours.  ProBNP (last 3 results) No results for input(s): PROBNP in the last 8760 hours.  CBG: No results for input(s): GLUCAP in the last 168 hours.   Assessment/Plan Active Problems:   Sickle cell anemia with pain (HCC)   Admits to the [facility]  IVF D5 .  45% Saline @ 125 mls/hour  Weight based Dilaudid PCA started within 30 minutes of admission plus clinician assisted doses of dilaudid IV  IV Toradol 30 mg Q 6 H  Monitor vitals very closely, Re-evaluate pain scale every hour  2 L of Oxygen by [place]  Patient will be re-evaluated for pain in the context of function and relationship to baseline as care progresses.  If no significant relieve from pain (remains above 5/10) will transfer patient to inpatient services for further evaluation and management  Code Status: Full  Family Communication: None  DVT Prophylaxis: Ambulate as tolerated   Time spent: 52 Minutes  Lonia Roane, MD, MHA, FACP, FAAP, CPE  If 7PM-7AM, please contact night-coverage [email or web address] 07/06/2016, 9:57 AM

## 2016-07-06 NOTE — Discharge Instructions (Signed)
Sickle Cell Anemia, Adult Sickle cell anemia is a condition in which red blood cells have an abnormal "sickle" shape. This abnormal shape shortens the cells' life span, which results in a lower than normal concentration of red blood cells in the blood. The sickle shape also causes the cells to clump together and block free blood flow through the blood vessels. As a result, the tissues and organs of the body do not receive enough oxygen. Sickle cell anemia causes organ damage and pain and increases the risk of infection. CAUSES  Sickle cell anemia is a genetic disorder. Those who receive two copies of the gene have the condition, and those who receive one copy have the trait. RISK FACTORS The sickle cell gene is most common in people whose families originated in [place]. Other areas of the globe where sickle cell trait occurs include the [place], [place] and [place], the [place], and the [place].  SIGNS AND SYMPTOMS  Pain, especially in the extremities, back, chest, or abdomen (common). The pain may start suddenly or may develop following an illness, especially if there is dehydration. Pain can also occur due to overexertion or exposure to extreme temperature changes.  Frequent severe bacterial infections, especially certain types of pneumonia and meningitis.  Pain and swelling in the hands and feet.  Decreased activity.   Loss of appetite.   Change in behavior.  Headaches.  Seizures.  Shortness of breath or difficulty breathing.  Vision changes.  Skin ulcers. Those with the trait may not have symptoms or they may have mild symptoms.  DIAGNOSIS  Sickle cell anemia is diagnosed with blood tests that demonstrate the genetic trait. It is often diagnosed during the newborn period, due to mandatory testing nationwide. A variety of blood tests, X-rays, CT scans, MRI scans, ultrasounds, and lung function tests may also be done to monitor the condition. TREATMENT  Sickle  cell anemia may be treated with:  Medicines. You may be given pain medicines, antibiotic medicines (to treat and prevent infections) or medicines to increase the production of certain types of hemoglobin.  Fluids.  Oxygen.  Blood transfusions. HOME CARE INSTRUCTIONS   Drink enough fluid to keep your urine clear or pale yellow. Increase your fluid intake in hot weather and during exercise.  Do not smoke. Smoking lowers oxygen levels in the blood.   Only take over-the-counter or prescription medicines for pain, fever, or discomfort as directed by your health care provider.  Take antibiotics as directed by your health care provider. Make sure you finish them it even if you start to feel better.   Take supplements as directed by your health care provider.   Consider wearing a medical alert bracelet. This tells anyone caring for you in an emergency of your condition.   When traveling, keep your medical information, health care provider's names, and the medicines you take with you at all times.   If you develop a fever, do not take medicines to reduce the fever right away. This could cover up a problem that is developing. Notify your health care provider.  Keep all follow-up appointments with your health care provider. Sickle cell anemia requires regular medical care. SEEK MEDICAL CARE IF: You have a fever. SEEK IMMEDIATE MEDICAL CARE IF:   You feel dizzy or faint.   You have new abdominal pain, especially on the left side near the stomach area.   You develop a persistent, often uncomfortable and painful penile erection (priapism). If this is not treated immediately it   will lead to impotence.   You have numbness your arms or legs or you have a hard time moving them.   You have a hard time with speech.   You have a fever or persistent symptoms for more than 2-3 days.   You have a fever and your symptoms suddenly get worse.   You have signs or symptoms of infection.  These include:   Chills.   Abnormal tiredness (lethargy).   Irritability.   Poor eating.   Vomiting.   You develop pain that is not helped with medicine.   You develop shortness of breath.  You have pain in your chest.   You are coughing up pus-like or bloody sputum.   You develop a stiff neck.  Your feet or hands swell or have pain.  Your abdomen appears bloated.  You develop joint pain. MAKE SURE YOU:  Understand these instructions.   This information is not intended to replace advice given to you by your health care provider. Make sure you discuss any questions you have with your health care provider.   Document Released: 12/19/2005 Document Revised: 10/01/2014 Document Reviewed: 04/22/2013 Elsevier Interactive Patient Education 2016 [employer]  

## 2016-07-06 NOTE — Telephone Encounter (Signed)
Advised patient that per Dr. Doreene Burke she can come to [facility].  Patient verbalizes understanding.

## 2016-07-06 NOTE — Progress Notes (Signed)
Patient ID: Dawn [name], female   DOB: Jan 08, 1981, 35 y.o.   MRN: [id number] Discharge instructions given to patient.  IV removed without difficulty.  Patient states pain has improved to 4/10 on pain scale.  Sister is here for transport.

## 2016-07-06 NOTE — Discharge Summary (Signed)
Physician Discharge Summary  Dawn [name] [id number] DOB: 03-15-1981 DOA: 07/06/2016  PCP: Angelica Chessman, MD  Admit date: 07/06/2016  Discharge date: 07/06/2016  Time spent: 30 minutes  Discharge Diagnoses:  Active Problems:   Sickle cell anemia with pain [facility])  Discharge Condition: Stable  Diet recommendation: Regular  Filed Weights   07/06/16 0953  Weight: 169 lb (76.7 kg)   History of present illness:  Dawn [name] is a 35 y.o. female with history of sickle cell disease. She presents to the day hospital today for a pain crisis that started 3 days ago with pain in her legs and lower back rated at 8/10, constant, aching and throbbing. She was in the ED on 07/03/2016 for the same pain. She took her last MS 15 at 7 am and her last oxycodone also at 8am. She reports nothing different about this episode. She denies chest pain, cough, SOB. No N/V/D, no abd pain, no dysuria, no fever or chils. She reports taking her hydrea and folic acid regularly.   Hospital Course:  Dawn [name] was admitted to the day hospital with sickle cell painful crisis. Patient was treated with weight based IV Dilaudid PCA, IV Toradol as well as IV fluids. Dawn [name] showed significant improvement symptomatically, pain improved from 8 to 4/10 at the time of discharge. Patient was discharged home is a hemodynamically stable condition. Dawn [name] will follow-up at the clinic as previously scheduled, continue with home medications as per prior to admission.  Discharge Instructions We discussed the need for good hydration, monitoring of hydration status, avoidance of heat, cold, stress, and infection triggers. We discussed the need to be compliant with taking Hydrea. Dawn [name] was reminded of the need to seek medical attention of any symptoms of bleeding, anemia, or infection occurs.  Discharge Exam: Vitals:   07/06/16 1347 07/06/16 1502  BP: (!) 149/90 129/77  Pulse:  88  Resp: 15 16  Temp:      General appearance: alert,  cooperative and no distress, icterus+ Eyes: conjunctivae/corneas clear. PERRL, EOM's intact. Fundi benign. Neck: no adenopathy, no carotid bruit, no JVD, supple, symmetrical, trachea midline and thyroid not enlarged, symmetric, no tenderness/mass/nodules Back: symmetric, no curvature. ROM normal. No CVA tenderness. Resp: clear to auscultation bilaterally Chest wall: no tenderness Cardio: regular rate and rhythm, S1, S2 normal, no murmur, click, rub or gallop GI: soft, non-tender; bowel sounds normal; no masses, no organomegaly Extremities: extremities normal, atraumatic, no cyanosis or edema Pulses: 2+ and symmetric Skin: Skin color, texture, turgor normal. No rashes or lesions Neurologic: Grossly normal   Discharge Medication List as of 07/06/2016  4:46 PM    CONTINUE these medications which have NOT CHANGED   Details  acetaminophen (TYLENOL) 500 MG tablet Take 500 mg by mouth every 6 (six) hours as needed for mild pain. , Until Discontinued, Historical Med    folic acid (FOLVITE) 1 MG tablet Take 1 tablet (1 mg total) by mouth daily., Starting 03/14/2016, Until Discontinued, Normal    hydroxyurea (HYDREA) 500 MG capsule Take 2 capsules (1,000 mg total) by mouth daily. May take with food to minimize GI side effects., Starting 03/14/2016, Until Discontinued, Normal    ibuprofen (ADVIL,MOTRIN) 200 MG tablet Take 200 mg by mouth every 6 (six) hours as needed for mild pain or moderate pain., Until Discontinued, Historical Med    loratadine (CLARITIN) 10 MG tablet Take 10 mg by mouth daily., Until Discontinued, Historical Med    morphine (MS CONTIN) 15 MG 12 hr tablet  Take 1 tablet (15 mg total) by mouth every 12 (twelve) hours., Starting 03/01/2016, Until Discontinued, Print    Multiple Vitamin (MULTIVITAMIN WITH MINERALS) TABS tablet Take 1 tablet by mouth daily., Until Discontinued, Historical Med    Oxycodone HCl 10 MG TABS Take 1 tablet (10 mg total) by mouth every 4 (four) hours as  needed (breakthrough pain)., Starting Fri 06/01/2016, Print    promethazine (PHENERGAN) 25 MG tablet Take 1 tablet (25 mg total) by mouth every 6 (six) hours as needed for nausea or vomiting., Starting Fri 06/01/2016, Normal    Vitamin D, Ergocalciferol, (DRISDOL) 50000 units CAPS capsule Take 1 capsule (50,000 Units total) by mouth every 7 (seven) days. Takes on Sundays., Starting Fri 06/01/2016, Normal    levonorgestrel (MIRENA) 20 MCG/24HR IUD 1 each by Intrauterine route once., Historical Med       Allergies  Allergen Reactions  . Desferal [Deferoxamine] Hives    Local reaction on arm only during infusion. Can take with benadryl   . Latex Other (See Comments)    REACTION: Pt experiences a burning sensation on contacted skin areas  . Lisinopril Other (See Comments) and Cough    REACTION: Sore/scratchy throat  . Tape Other (See Comments)    REACTION: Pt. Experiences a burning sensation on contacted skin areas   Follow-up Information    Angelica Chessman, MD .   Specialty:  Internal Medicine Why:  AS NEEDED Contact information: [place] [address] [phone]           Significant Diagnostic Studies: No results found.  Signed:  Angelica Chessman MD, [place], [place], [place], [place]   07/06/2016, 7:02 PM

## 2016-07-08 LAB — RETICULOCYTES
RBC.: 2.11 MIL/uL — AB (ref 3.87–5.11)
RETIC COUNT ABSOLUTE: 542.3 10*3/uL — AB (ref 19.0–186.0)

## 2016-07-10 ENCOUNTER — Other Ambulatory Visit: Payer: Self-pay | Admitting: Internal Medicine

## 2016-07-10 DIAGNOSIS — D571 Sickle-cell disease without crisis: Secondary | ICD-10-CM

## 2016-07-15 ENCOUNTER — Other Ambulatory Visit: Payer: Self-pay | Admitting: Radiology

## 2016-07-17 ENCOUNTER — Ambulatory Visit (HOSPITAL_COMMUNITY)
Admission: RE | Admit: 2016-07-17 | Discharge: 2016-07-17 | Disposition: A | Discharge status: Home / Self Care | Payer: Medicare Other | Source: Ambulatory Visit | Attending: Internal Medicine | Admitting: Internal Medicine

## 2016-07-17 ENCOUNTER — Encounter (HOSPITAL_COMMUNITY): Payer: Self-pay

## 2016-07-17 ENCOUNTER — Other Ambulatory Visit: Payer: Self-pay | Admitting: Internal Medicine

## 2016-07-17 DIAGNOSIS — Z833 Family history of diabetes mellitus: Secondary | ICD-10-CM | POA: Insufficient documentation

## 2016-07-17 DIAGNOSIS — D57419 Sickle-cell thalassemia with crisis, unspecified: Secondary | ICD-10-CM | POA: Diagnosis not present

## 2016-07-17 DIAGNOSIS — I272 Pulmonary hypertension, unspecified: Secondary | ICD-10-CM | POA: Diagnosis not present

## 2016-07-17 DIAGNOSIS — D571 Sickle-cell disease without crisis: Secondary | ICD-10-CM

## 2016-07-17 DIAGNOSIS — Z8249 Family history of ischemic heart disease and other diseases of the circulatory system: Secondary | ICD-10-CM | POA: Insufficient documentation

## 2016-07-17 DIAGNOSIS — I878 Other specified disorders of veins: Secondary | ICD-10-CM | POA: Insufficient documentation

## 2016-07-17 DIAGNOSIS — Z803 Family history of malignant neoplasm of breast: Secondary | ICD-10-CM | POA: Diagnosis not present

## 2016-07-17 DIAGNOSIS — Z452 Encounter for adjustment and management of vascular access device: Secondary | ICD-10-CM | POA: Diagnosis not present

## 2016-07-17 HISTORY — PX: IR GENERIC HISTORICAL: IMG1180011

## 2016-07-17 LAB — CBC WITH DIFFERENTIAL/PLATELET
BASOS ABS: 0 10*3/uL (ref 0.0–0.1)
BASOS PCT: 0 %
EOS ABS: 0.1 10*3/uL (ref 0.0–0.7)
Eosinophils Relative: 1 %
HCT: 21.7 % — ABNORMAL LOW (ref 36.0–46.0)
Hemoglobin: 8.1 g/dL — ABNORMAL LOW (ref 12.0–15.0)
LYMPHS PCT: 37 %
Lymphs Abs: 2.4 10*3/uL (ref 0.7–4.0)
MCH: 37.5 pg — ABNORMAL HIGH (ref 26.0–34.0)
MCHC: 37.3 g/dL — AB (ref 30.0–36.0)
MCV: 100.5 fL — ABNORMAL HIGH (ref 78.0–100.0)
MONO ABS: 0.8 10*3/uL (ref 0.1–1.0)
MONOS PCT: 12 %
NEUTROS PCT: 50 %
Neutro Abs: 3.3 10*3/uL (ref 1.7–7.7)
Platelets: 266 10*3/uL (ref 150–400)
RBC: 2.16 MIL/uL — ABNORMAL LOW (ref 3.87–5.11)
RDW: 22.7 % — AB (ref 11.5–15.5)
WBC: 6.6 10*3/uL (ref 4.0–10.5)
nRBC: 4 /100 WBC — ABNORMAL HIGH

## 2016-07-17 LAB — PROTIME-INR
INR: 1.23
PROTHROMBIN TIME: 15.6 s — AB (ref 11.4–15.2)

## 2016-07-17 LAB — HCG, SERUM, QUALITATIVE: Preg, Serum: NEGATIVE

## 2016-07-17 LAB — BASIC METABOLIC PANEL
ANION GAP: 6 (ref 5–15)
BUN: 6 mg/dL (ref 6–20)
CALCIUM: 8.7 mg/dL — AB (ref 8.9–10.3)
CO2: 21 mmol/L — AB (ref 22–32)
Chloride: 108 mmol/L (ref 101–111)
Creatinine, Ser: 0.44 mg/dL (ref 0.44–1.00)
Glucose, Bld: 78 mg/dL (ref 65–99)
POTASSIUM: 4.2 mmol/L (ref 3.5–5.1)
Sodium: 135 mmol/L (ref 135–145)

## 2016-07-17 MED ORDER — FENTANYL CITRATE (PF) 100 MCG/2ML IJ SOLN
INTRAMUSCULAR | Status: AC
  Filled 2016-07-17: qty 4

## 2016-07-17 MED ORDER — HEPARIN SOD (PORK) LOCK FLUSH 100 UNIT/ML IV SOLN
INTRAVENOUS | Status: AC | PRN
  Administered 2016-07-17: 500 [IU]

## 2016-07-17 MED ORDER — LIDOCAINE HCL 1 % IJ SOLN
INTRAMUSCULAR | Status: AC
  Filled 2016-07-17: qty 20

## 2016-07-17 MED ORDER — MIDAZOLAM HCL 2 MG/2ML IJ SOLN
INTRAMUSCULAR | Status: AC
  Filled 2016-07-17: qty 4

## 2016-07-17 MED ORDER — SODIUM CHLORIDE 0.9 % IV SOLN
INTRAVENOUS | Status: DC
  Administered 2016-07-17: 13:00:00 via INTRAVENOUS

## 2016-07-17 MED ORDER — LIDOCAINE HCL 1 % IJ SOLN
INTRAMUSCULAR | Status: AC | PRN
  Administered 2016-07-17: 15 mL

## 2016-07-17 MED ORDER — HYDROCODONE-ACETAMINOPHEN 10-325 MG PO TABS
1.0000 | ORAL_TABLET | ORAL | Status: DC | PRN
  Administered 2016-07-17: 1 via ORAL
  Filled 2016-07-17 (×2): qty 1

## 2016-07-17 MED ORDER — MIDAZOLAM HCL 2 MG/2ML IJ SOLN
INTRAMUSCULAR | Status: AC
  Filled 2016-07-17: qty 6

## 2016-07-17 MED ORDER — HEPARIN SOD (PORK) LOCK FLUSH 100 UNIT/ML IV SOLN
INTRAVENOUS | Status: AC
  Filled 2016-07-17: qty 5

## 2016-07-17 MED ORDER — FENTANYL CITRATE (PF) 100 MCG/2ML IJ SOLN
INTRAMUSCULAR | Status: AC | PRN
  Administered 2016-07-17: 50 ug via INTRAVENOUS
  Administered 2016-07-17 (×6): 25 ug via INTRAVENOUS

## 2016-07-17 MED ORDER — CEFAZOLIN SODIUM-DEXTROSE 2-4 GM/100ML-% IV SOLN
2.0000 g | INTRAVENOUS | Status: AC
  Administered 2016-07-17: 2 g via INTRAVENOUS
  Filled 2016-07-17: qty 100

## 2016-07-17 MED ORDER — MIDAZOLAM HCL 2 MG/2ML IJ SOLN
INTRAMUSCULAR | Status: AC | PRN
  Administered 2016-07-17: 1 mg via INTRAVENOUS
  Administered 2016-07-17: 2 mg via INTRAVENOUS
  Administered 2016-07-17 (×7): 1 mg via INTRAVENOUS

## 2016-07-17 NOTE — Procedures (Signed)
Interventional Radiology Procedure Note  Procedure: Placement of a right IJ approach single lumen PowerPort.  Tip is positioned at the superior cavoatrial junction and catheter is ready for immediate use.  Complications: No immediate Recommendations:  - Ok to shower tomorrow - Do not submerge for 7 days - Routine line care   Jones Viviani T. Rasha Ibe, M.D Pager:  [phone]   

## 2016-07-17 NOTE — H&P (Signed)
Chief Complaint: Patient was seen in consultation today for port placement at the request of Jegede,Olugbemiga E  Referring Physician(s): Tresa Garter  Supervising Physician: Aletta Edouard  Patient Status: [facility] - Out-pt  History of Present Illness: Dawn [name] is a 35 y.o. female with sickle cell disease who has frequent admissions and poor venous access. She has had both PICC lines in the past as well as implanted ports. She tells me she has had 3 prior ports, the most recent was 2011, but unfortunately they had to be removed because of malfunction or infection. She and her referring physician understand the risk of infection for sickle pts with indwelling catheters, but she really does not have any other options. PMHx, meds, labs, allergies reviewed. Has been NPO today. Feels okay, no recent illness, fevers, chills./  Past Medical History:  Diagnosis Date  . Blood transfusion   . Demand ischemia ([place]) 02/06/2016  . HCAP (healthcare-associated pneumonia) 05/19/2013  . 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 Pulmonary hypertension 02/06/2016   49 mmHg per echo on 02/06/16   . 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 Sickle cell anemia (HCC)   . Sickle cell disease ([place])   . Sickle cell disease, type S ([place])   . Vitamin B12 deficiency 07/07/2015    Past Surgical History:  Procedure Laterality Date  .  left knee ACL reconstruction    . CESAREAN SECTION     x 2  . CHOLECYSTECTOMY    . PORT-A-CATH REMOVAL Left 07/29/2013   Procedure: REMOVAL PORT-A-CATH;  Surgeon: Scherry Ran, MD;  Location: [facility] ORS;  Service: General;  Laterality: Left;  . PORTACATH PLACEMENT    . PORTACATH PLACEMENT Left 02/23/2013   Procedure: INSERTION PORT-A-CATH;  Surgeon: Donato Heinz, MD;  Location: [facility] ORS;  Service: General;  Laterality: Left;  . TEE WITHOUT CARDIOVERSION N/A 08/07/2013   Procedure: TRANSESOPHAGEAL ECHOCARDIOGRAM (TEE);  Surgeon: Arnoldo Lenis, MD;  Location: [facility]  ENDO SUITE;  Service: Cardiology;  Laterality: N/A;    Allergies: Desferal [deferoxamine]; Latex; Lisinopril; Tape; and Tylenol [acetaminophen]  Medications: Prior to Admission medications   Medication Sig Start Date End Date Taking? Authorizing Provider  folic acid (FOLVITE) 1 MG tablet Take 1 tablet (1 mg total) by mouth daily. 03/14/16  Yes [name], FNP  hydroxyurea (HYDREA) 500 MG capsule Take 2 capsules (1,000 mg total) by mouth daily. May take with food to minimize GI side effects. 03/14/16  Yes [name], FNP  loratadine (CLARITIN) 10 MG tablet Take 10 mg by mouth daily.   Yes Historical Provider, MD  morphine (MS CONTIN) 15 MG 12 hr tablet Take 1 tablet (15 mg total) by mouth every 12 (twelve) hours. 03/01/16  Yes [name], FNP  Multiple Vitamin (MULTIVITAMIN WITH MINERALS) TABS tablet Take 1 tablet by mouth daily.   Yes Historical Provider, MD  Oxycodone HCl 10 MG TABS Take 1 tablet (10 mg total) by mouth every 4 (four) hours as needed (breakthrough pain). 06/01/16  Yes Micheline Chapman, NP  promethazine (PHENERGAN) 25 MG tablet Take 1 tablet (25 mg total) by mouth every 6 (six) hours as needed for nausea or vomiting. 06/01/16  Yes Micheline Chapman, NP  acetaminophen (TYLENOL) 500 MG tablet Take 500 mg by mouth every 6 (six) hours as needed for mild pain.     Historical Provider, MD  ibuprofen (ADVIL,MOTRIN) 200 MG tablet Take 200 mg by mouth every 6 (six) hours as needed for mild  pain or moderate pain.    Historical Provider, MD  levonorgestrel (MIRENA) 20 MCG/24HR IUD 1 each by Intrauterine route once.    Historical Provider, MD  Vitamin D, Ergocalciferol, (DRISDOL) 50000 units CAPS capsule Take 1 capsule (50,000 Units total) by mouth every 7 (seven) days. Takes on Sundays. 06/01/16   Micheline Chapman, NP     Family History  Problem Relation Age of Onset  . Sickle cell trait Mother   . Sickle cell trait Father   . Hypertension Father   . Diabetes Father   .  Sickle cell trait Sister   . Cancer Paternal Aunt     Breast    Social History   Social History  . Marital status: Divorced    Spouse name: N/A  . Number of children: N/A  . Years of education: N/A   Social History Main Topics  . Smoking status: Never Smoker  . Smokeless tobacco: Never Used  . Alcohol use No  . Drug use: No  . Sexual activity: Yes    Birth control/ protection: IUD   Other Topics Concern  . None   Social History Narrative  . None    ECOG Status: 0 - Asymptomatic  Review of Systems: A 12 point ROS discussed and pertinent positives are indicated in the HPI above.  All other systems are negative.  Review of Systems  Vital Signs: BP 133/87   Pulse 90   Temp 98.8 F (37.1 C) (Oral)   Resp 16   Ht 5\' 4"  (1.626 m)   Wt 169 lb (76.7 kg)   SpO2 96%   BMI 29.01 kg/m   Physical Exam  Constitutional: She is oriented to person, place, and time. She appears well-developed and well-nourished.  HENT:  Head: Normocephalic.  Mouth/Throat: Oropharynx is clear and moist.  Neck: Normal range of motion. No tracheal deviation present.  Cardiovascular: Normal rate, regular rhythm and normal heart sounds.   Pulmonary/Chest: Effort normal and breath sounds normal. No respiratory distress. She has no wheezes. She has no rales.  Neurological: She is alert and oriented to person, place, and time.  Skin: Skin is warm and dry.  Old scar right chest. Some subq venous collaterals evident  Psychiatric: She has a normal mood and affect. Judgment normal.    Mallampati Score:  MD Evaluation Airway: WNL Heart: WNL Abdomen: WNL Chest/ Lungs: WNL ASA  Classification: 2 Mallampati/Airway Score: One  Imaging: No results found.  Labs:  CBC:  Recent Labs  06/05/16 1005 06/21/16 0939 07/03/16 1147 07/06/16 0956  WBC 6.5 5.9 6.9 9.4  HGB 8.2* 9.0* 8.1* 7.7*  HCT 23.1* 25.2* 22.2* 20.8*  PLT 128* 287 139* 161    COAGS:  Recent Labs  02/06/16 0529  INR  1.49  APTT 38*    BMP:  Recent Labs  06/05/16 1005 06/07/16 2007 06/21/16 0939 07/03/16 1147  NA 136 135 137 135  K 3.6 4.0 3.7 3.7  CL 103 104 110 105  CO2 23 24 23 24   GLUCOSE 93 78 105* 79  BUN 6 7 6 6   CALCIUM 9.2 8.7* 8.4* 8.7*  CREATININE 0.52 0.45 0.51 0.56  GFRNONAA >60 >60 >60 >60  GFRAA >60 >60 >60 >60    LIVER FUNCTION TESTS:  Recent Labs  06/05/16 1005 06/07/16 2007 06/21/16 0939 07/03/16 1147  BILITOT 10.5* 10.9* 9.4* 13.2*  AST 116* 139* 107* 135*  ALT 46 50 45 52  ALKPHOS 146* 158* 138* 142*  PROT 7.9 8.8*  7.6 8.1  ALBUMIN 3.1* 3.4* 2.8* 3.0*    TUMOR MARKERS: No results for input(s): AFPTM, CEA, CA199, CHROMGRNA in the last 8760 hours.  Assessment and Plan: Sickle cell disease Poor venous access For port placement Labs pending Risks and Benefits discussed with the patient including, but not limited to bleeding, infection, pneumothorax, or fibrin sheath development and need for additional procedures. All of the patient's questions were answered, patient is agreeable to proceed. Consent signed and in chart.    Thank you for this interesting consult.  I greatly enjoyed meeting Dawn [name] and look forward to participating in their care.  A copy of this report was sent to the requesting provider on this date.  Electronically Signed: Ascencion Dike 07/17/2016, 12:37 PM   I spent a total of 20 minutes in face to face in clinical consultation, greater than 50% of which was counseling/coordinating care for port placement

## 2016-07-17 NOTE — Discharge Instructions (Signed)
Moderate Conscious Sedation, Adult, Care After °Refer to this sheet in the next few weeks. These instructions provide you with information on caring for yourself after your procedure. Your health care provider may also give you more specific instructions. Your treatment has been planned according to current medical practices, but problems sometimes occur. Call your health care provider if you have any problems or questions after your procedure. °WHAT TO EXPECT AFTER THE PROCEDURE  °After your procedure: °· You may feel sleepy, clumsy, and have poor balance for several hours. °· Vomiting may occur if you eat too soon after the procedure. °HOME CARE INSTRUCTIONS °· Do not participate in any activities where you could become injured for at least 24 hours. Do not: °¨ Drive. °¨ Swim. °¨ Ride a bicycle. °¨ Operate heavy machinery. °¨ Cook. °¨ Use power tools. °¨ Climb ladders. °¨ Work from a high place. °· Do not make important decisions or sign legal documents until you are improved. °· If you vomit, drink water, juice, or soup when you can drink without vomiting. Make sure you have little or no nausea before eating solid foods. °· Only take over-the-counter or prescription medicines for pain, discomfort, or fever as directed by your health care provider. °· Make sure you and your family fully understand everything about the medicines given to you, including what side effects may occur. °· You should not drink alcohol, take sleeping pills, or take medicines that cause drowsiness for at least 24 hours. °· If you smoke, do not smoke without supervision. °· If you are feeling better, you may resume normal activities 24 hours after you were sedated. °· Keep all appointments with your health care provider. °SEEK MEDICAL CARE IF: °· Your skin is pale or bluish in color. °· You continue to feel nauseous or vomit. °· Your pain is getting worse and is not helped by medicine. °· You have bleeding or swelling. °· You are still  sleepy or feeling clumsy after 24 hours. °SEEK IMMEDIATE MEDICAL CARE IF: °· You develop a rash. °· You have difficulty breathing. °· You develop any type of allergic problem. °· You have a fever. °MAKE SURE YOU: °· Understand these instructions. °· Will watch your condition. °· Will get help right away if you are not doing well or get worse. °  °This information is not intended to replace advice given to you by your health care provider. Make sure you discuss any questions you have with your health care provider. °  °Document Released: 07/01/2013 Document Revised: 10/01/2014 Document Reviewed: 07/01/2013 °Elsevier Interactive Patient Education ©2016 Elsevier Inc[employer] °Implanted Port Home Guide °An implanted port is a type of central line that is placed under the skin. Central lines are used to provide IV access when treatment or nutrition needs to be given through a person's veins. Implanted ports are used for long-term IV access. An implanted port may be placed because:  °· You need IV medicine that would be irritating to the small veins in your hands or arms.   °· You need long-term IV medicines, such as antibiotics.   °· You need IV nutrition for a long period.   °· You need frequent blood draws for lab tests.   °· You need dialysis.   °Implanted ports are usually placed in the chest area, but they can also be placed in the upper arm, the abdomen, or the leg. An implanted port has two main parts:  °· Reservoir. The reservoir is round and will appear as a small, raised area under   your skin. The reservoir is the part where a needle is inserted to give medicines or draw blood.   °· Catheter. The catheter is a thin, flexible tube that extends from the reservoir. The catheter is placed into a large vein. Medicine that is inserted into the reservoir goes into the catheter and then into the vein.   °HOW WILL I CARE FOR MY INCISION SITE? °Do not get the incision site wet. Bathe or shower as directed by your health care  provider.  °HOW IS MY PORT ACCESSED? °Special steps must be taken to access the port:  °· Before the port is accessed, a numbing cream can be placed on the skin. This helps numb the skin over the port site.   °· Your health care provider uses a sterile technique to access the port. °¨ Your health care provider must put on a mask and sterile gloves. °¨ The skin over your port is cleaned carefully with an antiseptic and allowed to dry. °¨ The port is gently pinched between sterile gloves, and a needle is inserted into the port. °· Only "non-coring" port needles should be used to access the port. Once the port is accessed, a blood return should be checked. This helps ensure that the port is in the vein and is not clogged.   °· If your port needs to remain accessed for a constant infusion, a clear (transparent) bandage will be placed over the needle site. The bandage and needle will need to be changed every week, or as directed by your health care provider.   °· Keep the bandage covering the needle clean and dry. Do not get it wet. Follow your health care provider's instructions on how to take a shower or bath while the port is accessed.   °· If your port does not need to stay accessed, no bandage is needed over the port.   °WHAT IS FLUSHING? °Flushing helps keep the port from getting clogged. Follow your health care provider's instructions on how and when to flush the port. Ports are usually flushed with saline solution or a medicine called heparin. The need for flushing will depend on how the port is used.  °· If the port is used for intermittent medicines or blood draws, the port will need to be flushed:   °¨ After medicines have been given.   °¨ After blood has been drawn.   °¨ As part of routine maintenance.   °· If a constant infusion is running, the port may not need to be flushed.   °HOW LONG WILL MY PORT STAY IMPLANTED? °The port can stay in for as long as your health care provider thinks it is needed. When it  is time for the port to come out, surgery will be done to remove it. The procedure is similar to the one performed when the port was put in.  °WHEN SHOULD I SEEK IMMEDIATE MEDICAL CARE? °When you have an implanted port, you should seek immediate medical care if:  °· You notice a bad smell coming from the incision site.   °· You have swelling, redness, or drainage at the incision site.   °· You have more swelling or pain at the port site or the surrounding area.   °· You have a fever that is not controlled with medicine. °  °This information is not intended to replace advice given to you by your health care provider. Make sure you discuss any questions you have with your health care provider. °  °Document Released: 09/10/2005 Document Revised: 07/01/2013 Document Reviewed: 05/18/2013 °Elsevier Interactive Patient Education ©  2016 Elsevier Inc[employer] °Implanted Port Insertion, Care After °Refer to this sheet in the next few weeks. These instructions provide you with information on caring for yourself after your procedure. Your health care provider may also give you more specific instructions. Your treatment has been planned according to current medical practices, but problems sometimes occur. Call your health care provider if you have any problems or questions after your procedure. °WHAT TO EXPECT AFTER THE PROCEDURE °After your procedure, it is typical to have the following:  °· Discomfort at the port insertion site. Ice packs to the area will help. °· Bruising on the skin over the port. This will subside in 3-4 days. °HOME CARE INSTRUCTIONS °· After your port is placed, you will get a manufacturer's information card. The card has information about your port. Keep this card with you at all times.   °· Know what kind of port you have. There are many types of ports available.   °· Wear a medical alert bracelet in case of an emergency. This can help alert health care workers that you have a port.   °· The port can stay in for  as long as your health care provider believes it is necessary.   °· A home health care nurse may give medicines and take care of the port.   °· You or a family member can get special training and directions for giving medicine and taking care of the port at home.   °SEEK MEDICAL CARE IF:  °· Your port does not flush or you are unable to get a blood return.   °· You have a fever or chills. °SEEK IMMEDIATE MEDICAL CARE IF: °· You have new fluid or pus coming from your incision.   °· You notice a bad smell coming from your incision site.   °· You have swelling, pain, or more redness at the incision or port site.   °· You have chest pain or shortness of breath. °  °This information is not intended to replace advice given to you by your health care provider. Make sure you discuss any questions you have with your health care provider. °  °Document Released: 07/01/2013 Document Revised: 09/15/2013 Document Reviewed: 07/01/2013 °Elsevier Interactive Patient Education ©2016 Elsevier Inc[employer] °Moderate Conscious Sedation, Adult °Sedation is the use of medicines to promote relaxation and relieve discomfort and anxiety. Moderate conscious sedation is a type of sedation. Under moderate conscious sedation you are less alert than normal but are still able to respond to instructions or stimulation. Moderate conscious sedation is used during short medical and dental procedures. It is milder than deep sedation or general anesthesia and allows you to return to your regular activities sooner. °LET YOUR HEALTH CARE PROVIDER KNOW ABOUT:  °· Any allergies you have. °· All medicines you are taking, including vitamins, herbs, eye drops, creams, and over-the-counter medicines. °· Use of steroids (by mouth or creams). °· Previous problems you or members of your family have had with the use of anesthetics. °· Any blood disorders you have. °· Previous surgeries you have had. °· Medical conditions you have. °· Possibility of pregnancy, if this  applies. °· Use of cigarettes, alcohol, or illegal drugs. °RISKS AND COMPLICATIONS °Generally, this is a safe procedure. However, as with any procedure, problems can occur. Possible problems include: °· Oversedation. °· Trouble breathing on your own. You may need to have a breathing tube until you are awake and breathing on your own. °· Allergic reaction to any of the medicines used for the procedure. °BEFORE THE PROCEDURE °· You   may have blood tests done. These tests can help show how well your kidneys and liver are working. They can also show how well your blood clots. °· A physical exam will be done.   °· Only take medicines as directed by your health care provider. You may need to stop taking medicines (such as blood thinners, aspirin, or nonsteroidal anti-inflammatory drugs) before the procedure.   °· Do not eat or drink at least 6 hours before the procedure or as directed by your health care provider. °· Arrange for a responsible adult, family member, or friend to take you home after the procedure. He or she should stay with you for at least 24 hours after the procedure, until the medicine has worn off. °PROCEDURE  °· An intravenous (IV) catheter will be inserted into one of your veins. Medicine will be able to flow directly into your body through this catheter. You may be given medicine through this tube to help prevent pain and help you relax. °· The medical or dental procedure will be done. °AFTER THE PROCEDURE °· You will stay in a recovery area until the medicine has worn off. Your blood pressure and pulse will be checked.   °·  Depending on the procedure you had, you may be allowed to go home when you can tolerate liquids and your pain is under control. °  °This information is not intended to replace advice given to you by your health care provider. Make sure you discuss any questions you have with your health care provider. °  °Document Released: 06/05/2001 Document Revised: 10/01/2014 Document  Reviewed: 05/18/2013 °Elsevier Interactive Patient Education ©2016 Elsevier Inc. ° °

## 2016-07-20 ENCOUNTER — Emergency Department (HOSPITAL_COMMUNITY)
Admission: EM | Admit: 2016-07-20 | Discharge: 2016-07-20 | Disposition: A | Discharge status: Home / Self Care | Payer: Medicare Other | Attending: Emergency Medicine | Admitting: Emergency Medicine

## 2016-07-20 ENCOUNTER — Encounter (HOSPITAL_COMMUNITY): Payer: Self-pay | Admitting: Emergency Medicine

## 2016-07-20 ENCOUNTER — Telehealth: Payer: Self-pay

## 2016-07-20 ENCOUNTER — Other Ambulatory Visit: Payer: Self-pay | Admitting: Internal Medicine

## 2016-07-20 DIAGNOSIS — D5700 Hb-SS disease with crisis, unspecified: Secondary | ICD-10-CM | POA: Diagnosis not present

## 2016-07-20 DIAGNOSIS — D571 Sickle-cell disease without crisis: Secondary | ICD-10-CM

## 2016-07-20 DIAGNOSIS — Z79899 Other long term (current) drug therapy: Secondary | ICD-10-CM | POA: Diagnosis not present

## 2016-07-20 LAB — COMPREHENSIVE METABOLIC PANEL
ALBUMIN: 3 g/dL — AB (ref 3.5–5.0)
ALT: 46 U/L (ref 14–54)
AST: 120 U/L — AB (ref 15–41)
Alkaline Phosphatase: 117 U/L (ref 38–126)
Anion gap: 7 (ref 5–15)
BUN: 6 mg/dL (ref 6–20)
CHLORIDE: 109 mmol/L (ref 101–111)
CO2: 20 mmol/L — ABNORMAL LOW (ref 22–32)
CREATININE: 0.48 mg/dL (ref 0.44–1.00)
Calcium: 8.8 mg/dL — ABNORMAL LOW (ref 8.9–10.3)
GFR calc Af Amer: >60 mL/min (ref >60)
GLUCOSE: 82 mg/dL (ref 65–99)
Potassium: 3.8 mmol/L (ref 3.5–5.1)
Sodium: 136 mmol/L (ref 135–145)
TOTAL PROTEIN: 8.1 g/dL (ref 6.5–8.1)
Total Bilirubin: 10.4 mg/dL — ABNORMAL HIGH (ref 0.3–1.2)

## 2016-07-20 LAB — CBC WITH DIFFERENTIAL/PLATELET
BASOS PCT: 0 %
Basophils Absolute: 0 10*3/uL (ref 0.0–0.1)
EOS PCT: 1 %
Eosinophils Absolute: 0.1 10*3/uL (ref 0.0–0.7)
HEMATOCRIT: 22.8 % — AB (ref 36.0–46.0)
Hemoglobin: 8.4 g/dL — ABNORMAL LOW (ref 12.0–15.0)
LYMPHS PCT: 33 %
Lymphs Abs: 2.4 10*3/uL (ref 0.7–4.0)
MCH: 38.5 pg — ABNORMAL HIGH (ref 26.0–34.0)
MCHC: 36.8 g/dL — ABNORMAL HIGH (ref 30.0–36.0)
MCV: 104.6 fL — AB (ref 78.0–100.0)
MONOS PCT: 16 %
Monocytes Absolute: 1.2 10*3/uL — ABNORMAL HIGH (ref 0.1–1.0)
NEUTROS PCT: 50 %
Neutro Abs: 3.5 10*3/uL (ref 1.7–7.7)
Platelets: 245 10*3/uL (ref 150–400)
RBC: 2.18 MIL/uL — ABNORMAL LOW (ref 3.87–5.11)
RDW: 21.7 % — ABNORMAL HIGH (ref 11.5–15.5)
WBC: 7.2 10*3/uL (ref 4.0–10.5)

## 2016-07-20 LAB — RETICULOCYTES
RBC.: 2.18 MIL/uL — ABNORMAL LOW (ref 3.87–5.11)
RETIC CT PCT: 18.7 % — AB (ref 0.4–3.1)
Retic Count, Absolute: 407.7 10*3/uL — ABNORMAL HIGH (ref 19.0–186.0)

## 2016-07-20 MED ORDER — PROMETHAZINE HCL 25 MG PO TABS: 25.0000 mg | ORAL_TABLET | Freq: Four times a day (QID) | ORAL | 3 refills | Status: DC | PRN

## 2016-07-20 MED ORDER — SODIUM CHLORIDE 0.9 % IV BOLUS (SEPSIS)
1000.0000 mL | Freq: Once | INTRAVENOUS | Status: AC
  Administered 2016-07-20: 1000 mL via INTRAVENOUS

## 2016-07-20 MED ORDER — HYDROMORPHONE HCL 2 MG/ML IJ SOLN
2.0000 mg | Freq: Once | INTRAMUSCULAR | Status: AC
  Administered 2016-07-20: 2 mg via INTRAVENOUS
  Filled 2016-07-20: qty 1

## 2016-07-20 MED ORDER — OXYCODONE HCL 10 MG PO TABS: 10.0000 mg | ORAL_TABLET | ORAL | 0 refills | Status: AC | PRN

## 2016-07-20 MED ORDER — HEPARIN SOD (PORK) LOCK FLUSH 100 UNIT/ML IV SOLN
INTRAVENOUS | Status: AC
  Administered 2016-07-20: 500 [IU]
  Filled 2016-07-20: qty 5

## 2016-07-20 MED ORDER — DIPHENHYDRAMINE HCL 50 MG/ML IJ SOLN
25.0000 mg | Freq: Once | INTRAMUSCULAR | Status: AC
Start: 2016-07-20 — End: 2016-07-20
  Administered 2016-07-20: 25 mg via INTRAVENOUS
  Filled 2016-07-20: qty 1

## 2016-07-20 MED ORDER — ONDANSETRON HCL 4 MG/2ML IJ SOLN
4.0000 mg | Freq: Once | INTRAMUSCULAR | Status: AC
  Administered 2016-07-20: 4 mg via INTRAVENOUS
  Filled 2016-07-20: qty 2

## 2016-07-20 MED ORDER — MORPHINE SULFATE ER 15 MG PO TBCR: 15.0000 mg | EXTENDED_RELEASE_TABLET | Freq: Two times a day (BID) | ORAL | 0 refills | Status: AC

## 2016-07-20 NOTE — ED Notes (Signed)
Pt Right port accessed in ED by Lupita Raider, RN. Area was cleaned appropriately and blood was returned. Area flushed with no pain noted.

## 2016-07-20 NOTE — ED Triage Notes (Signed)
Pt had a port place Tuesday, thinks may have triggered crisis. Pt complaining of leg and back pain

## 2016-07-20 NOTE — ED Notes (Signed)
IV removed from port a cath, flushed with heparin flush

## 2016-07-20 NOTE — Discharge Instructions (Signed)
Follow up with your md next week if needed °

## 2016-07-20 NOTE — ED Provider Notes (Signed)
[facility] DEPT Provider Note   CSN: [id number] Arrival date & time: 07/20/16  1225     History   Chief Complaint Chief Complaint  Patient presents with  . Sickle Cell Pain Crisis    HPI Dawn [name] is a 35 y.o. female.  Patient complains of pain in her legs. Patient states this is typical for her sickle cell pain. Her pain is more in her upper thighs   The history is provided by the patient. No language interpreter was used.  Sickle Cell Pain Crisis  Pain location: Both upper legs. Severity:  Severe Onset quality:  Sudden Similar to previous crisis episodes: yes   Timing:  Constant Progression:  Waxing and waning Chronicity:  Recurrent Sickle cell genotype:  SS History of pulmonary emboli: no   Context: not alcohol consumption   Associated symptoms: no chest pain, no congestion, no cough, no fatigue and no headaches     Past Medical History:  Diagnosis Date  . Blood transfusion   . Demand ischemia ([place]) 02/06/2016  . HCAP (healthcare-associated pneumonia) 05/19/2013  . 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 Pulmonary hypertension 02/06/2016   49 mmHg per echo on 02/06/16   . 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 Sickle cell anemia (HCC)   . Sickle cell disease ([place])   . 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 Vitamin B12 deficiency 07/07/2015    Patient Active Problem List   Diagnosis Date Noted  . Positive D dimer   . Sickle cell anemia with pain ([place]) 04/10/2016  . Pulmonary hypertension 02/16/2016  . 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 Pyrexia   . Sickle-cell/Hb-C disease with crisis ([place]) 02/13/2016  . Anemia 02/13/2016  . Demand ischemia ([place]) 02/13/2016  . Sepsis ([place]) 02/13/2016  . Fever 02/05/2016  . Tachycardia   . Anemia of chronic disease   . Chronic pain syndrome 10/14/2015  . UTI (urinary tract infection) 08/25/2015  . 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 Vitamin B12 deficiency 07/07/2015  . Acute pulmonary edema ([place])  07/06/2015  . Thrombocytopenia ([place]) 07/06/2015  . Acute respiratory failure with hypoxia ([place]) 09/12/2014  . Hyperbilirubinemia 09/11/2014  . Sickle cell disease with crisis ([place]) 09/09/2014  . Anxiety state 06/15/2014  . Nosebleed 06/15/2014  . Macrocytosis 06/13/2014  . Sickle cell crisis ([place]) 06/12/2014  . Sickle cell anemia with crisis ([place]) 05/24/2014  . Vitamin D deficiency 11/23/2013  . 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 Abnormal chest CT 08/07/2013  . Pain in the chest 08/03/2013  . Fungemia 07/28/2013  . Fever, unspecified 07/27/2013  . 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 Hypoxemia 05/14/2013  . 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 Hypokalemia 03/28/2012  . Hypoalbuminemia 03/28/2012  . 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 Leukocytosis 05/26/2011  . Elevated LFTs 04/27/2011  . Sickle cell anemia ([place]) 04/27/2011  . Sickle cell disease ([place]) 04/26/2011    Past Surgical History:  Procedure Laterality Date  .  left knee ACL reconstruction    . CESAREAN SECTION     x 2  . CHOLECYSTECTOMY    . IR GENERIC HISTORICAL  07/17/2016   IR FLUORO GUIDE PORT INSERTION RIGHT 07/17/2016 Aletta Edouard, MD [facility]  . IR GENERIC HISTORICAL  07/17/2016   IR US GUIDE VASC ACCESS RIGHT 07/17/2016 Aletta Edouard, MD [facility]  . PORT-A-CATH REMOVAL Left 07/29/2013   Procedure: REMOVAL PORT-A-CATH;  Surgeon: Scherry Ran, MD;  Location: [facility] OR[facility];  Service:  General;  Laterality: Left;  . PORTACATH PLACEMENT    . PORTACATH PLACEMENT Left 02/23/2013   Procedure: INSERTION PORT-A-CATH;  Surgeon: Donato Heinz, MD;  Location: [facility] ORS;  Service: General;  Laterality: Left;  . TEE WITHOUT CARDIOVERSION N/A 08/07/2013   Procedure: TRANSESOPHAGEAL ECHOCARDIOGRAM (TEE);  Surgeon: Arnoldo Lenis, MD;  Location: [facility];  Service: Cardiology;  Laterality: N/A;    OB History    Gravida Para Term Preterm AB  Living   1 1   1   1    SAB TAB Ectopic Multiple Live Births                   Home Medications    Prior to Admission medications   Medication Sig Start Date End Date Taking? Authorizing Provider  folic acid (FOLVITE) 1 MG tablet Take 1 tablet (1 mg total) by mouth daily. 03/14/16  Yes [name], FNP  hydroxyurea (HYDREA) 500 MG capsule Take 2 capsules (1,000 mg total) by mouth daily. May take with food to minimize GI side effects. 03/14/16  Yes [name], FNP  levonorgestrel (MIRENA) 20 MCG/24HR IUD 1 each by Intrauterine route once.   Yes Historical Provider, MD  loratadine (CLARITIN) 10 MG tablet Take 10 mg by mouth daily.   Yes Historical Provider, MD  morphine (MS CONTIN) 15 MG 12 hr tablet Take 1 tablet (15 mg total) by mouth every 12 (twelve) hours. 03/01/16  Yes [name], FNP  Multiple Vitamin (MULTIVITAMIN WITH MINERALS) TABS tablet Take 1 tablet by mouth daily.   Yes Historical Provider, MD  Oxycodone HCl 10 MG TABS Take 1 tablet (10 mg total) by mouth every 4 (four) hours as needed (breakthrough pain). 06/01/16  Yes Micheline Chapman, NP  promethazine (PHENERGAN) 25 MG tablet Take 1 tablet (25 mg total) by mouth every 6 (six) hours as needed for nausea or vomiting. 06/01/16  Yes Micheline Chapman, NP  Vitamin D, Ergocalciferol, (DRISDOL) 50000 units CAPS capsule Take 1 capsule (50,000 Units total) by mouth every 7 (seven) days. Takes on Sundays. 06/01/16   Micheline Chapman, NP    Family History Family History  Problem Relation Age of Onset  . Sickle cell trait Mother   . Sickle cell trait Father   . Hypertension Father   . Diabetes Father   . Sickle cell trait Sister   . Cancer Paternal Aunt     Breast    Social History Social History  Substance Use Topics  . Smoking status: Never Smoker  . Smokeless tobacco: Never Used  . Alcohol use No     Allergies   Desferal [deferoxamine]; Latex; Lisinopril; Tape; and Tylenol [acetaminophen]   Review of  Systems Review of Systems  Constitutional: Negative for appetite change and fatigue.  HENT: Negative for congestion, ear discharge and sinus pressure.   Eyes: Negative for discharge.  Respiratory: Negative for cough.   Cardiovascular: Negative for chest pain.  Gastrointestinal: Negative for abdominal pain and diarrhea.  Genitourinary: Negative for frequency and hematuria.  Musculoskeletal: Negative for back pain.       Pain in both legs  Skin: Negative for rash.  Neurological: Negative for seizures and headaches.  Psychiatric/Behavioral: Negative for hallucinations.     Physical Exam Updated Vital Signs BP 147/92   Pulse 87   Temp 98.2 F (36.8 C) (Oral)   Resp 19   Ht 5\' 4"  (1.626 m)   Wt 169 lb (76.7 kg)  SpO2 96%   BMI 29.01 kg/m   Physical Exam  Constitutional: She is oriented to person, place, and time. She appears well-developed.  HENT:  Head: Normocephalic.  Eyes: Conjunctivae and EOM are normal. No scleral icterus.  Neck: Neck supple. No thyromegaly present.  Cardiovascular: Normal rate and regular rhythm.  Exam reveals no gallop and no friction rub.   No murmur heard. Pulmonary/Chest: No stridor. She has no wheezes. She has no rales. She exhibits no tenderness.  Abdominal: She exhibits no distension. There is no tenderness. There is no rebound.  Musculoskeletal: Normal range of motion. She exhibits no edema.  Neurovascular exam normal, mild tenderness to both eyes,  Lymphadenopathy:    She has no cervical adenopathy.  Neurological: She is oriented to person, place, and time. She exhibits normal muscle tone. Coordination normal.  Skin: No rash noted. No erythema.  Psychiatric: She has a normal mood and affect. Her behavior is normal.     ED Treatments / Results  Labs (all labs ordered are listed, but only abnormal results are displayed) Labs Reviewed  CBC WITH DIFFERENTIAL/PLATELET - Abnormal; Notable for the following:       Result Value   RBC 2.18  (*)    Hemoglobin 8.4 (*)    HCT 22.8 (*)    MCV 104.6 (*)    MCH 38.5 (*)    MCHC 36.8 (*)    RDW 21.7 (*)    Monocytes Absolute 1.2 (*)    All other components within normal limits  COMPREHENSIVE METABOLIC PANEL - Abnormal; Notable for the following:    CO2 20 (*)    Calcium 8.8 (*)    Albumin 3.0 (*)    AST 120 (*)    Total Bilirubin 10.4 (*)    All other components within normal limits  RETICULOCYTES - Abnormal; Notable for the following:    Retic Ct Pct 18.7 (*)    RBC. 2.18 (*)    Retic Count, Manual 407.7 (*)    All other components within normal limits    EKG  EKG Interpretation None       Radiology No results found.  Procedures Procedures (including critical care time)  Medications Ordered in ED Medications  HYDROmorphone (DILAUDID) injection 2 mg (not administered)  diphenhydrAMINE (BENADRYL) injection 25 mg (not administered)  sodium chloride 0.9 % bolus 1,000 mL (1,000 mLs Intravenous New Bag/Given 07/20/16 1409)  ondansetron (ZOFRAN) injection 4 mg (4 mg Intravenous Given 07/20/16 1409)  HYDROmorphone (DILAUDID) injection 2 mg (2 mg Intravenous Given 07/20/16 1409)  HYDROmorphone (DILAUDID) injection 2 mg (2 mg Intravenous Given 07/20/16 1516)  diphenhydrAMINE (BENADRYL) injection 25 mg (25 mg Intravenous Given 07/20/16 1516)     Initial Impression / Assessment and Plan / ED Course  I have reviewed the triage vital signs and the nursing notes.  Pertinent labs & imaging results that were available during my care of the patient were reviewed by me and considered in my medical decision making (see chart for details).  Clinical Course  Patient with sickle cell crisis. Patient improved with pain medicine. She will be sent home and told to take her medicines as prescribed and follow-up with her PCP    Final Clinical Impressions(s) / ED Diagnoses   Final diagnoses:  None    New Prescriptions New Prescriptions   No medications on file       [name], MD 07/20/16 1554

## 2016-07-26 ENCOUNTER — Inpatient Hospital Stay (HOSPITAL_COMMUNITY)
Admission: AD | Admit: 2016-07-26 | Discharge: 2016-08-04 | DRG: 812 | Disposition: A | Discharge status: Home / Self Care | Payer: Medicare Other | Source: Ambulatory Visit | Attending: Internal Medicine | Admitting: Internal Medicine

## 2016-07-26 ENCOUNTER — Encounter: Payer: Self-pay | Admitting: Family Medicine

## 2016-07-26 ENCOUNTER — Ambulatory Visit (INDEPENDENT_AMBULATORY_CARE_PROVIDER_SITE_OTHER): Payer: Medicare Other | Admitting: Family Medicine

## 2016-07-26 ENCOUNTER — Encounter (HOSPITAL_COMMUNITY): Payer: Self-pay

## 2016-07-26 VITALS — BP 145/83 | HR 104 | Temp 98.6°F | Resp 16 | Ht 63.5 in | Wt 169.0 lb

## 2016-07-26 DIAGNOSIS — M79606 Pain in leg, unspecified: Secondary | ICD-10-CM | POA: Diagnosis not present

## 2016-07-26 DIAGNOSIS — Z886 Allergy status to analgesic agent status: Secondary | ICD-10-CM | POA: Diagnosis not present

## 2016-07-26 DIAGNOSIS — Z888 Allergy status to other drugs, medicaments and biological substances status: Secondary | ICD-10-CM | POA: Diagnosis not present

## 2016-07-26 DIAGNOSIS — I272 Pulmonary hypertension, unspecified: Secondary | ICD-10-CM | POA: Diagnosis present

## 2016-07-26 DIAGNOSIS — Z79899 Other long term (current) drug therapy: Secondary | ICD-10-CM

## 2016-07-26 DIAGNOSIS — D696 Thrombocytopenia, unspecified: Secondary | ICD-10-CM | POA: Diagnosis not present

## 2016-07-26 DIAGNOSIS — Z91048 Other nonmedicinal substance allergy status: Secondary | ICD-10-CM

## 2016-07-26 DIAGNOSIS — Z91040 Latex allergy status: Secondary | ICD-10-CM | POA: Diagnosis not present

## 2016-07-26 DIAGNOSIS — D5700 Hb-SS disease with crisis, unspecified: Secondary | ICD-10-CM | POA: Diagnosis not present

## 2016-07-26 DIAGNOSIS — D638 Anemia in other chronic diseases classified elsewhere: Secondary | ICD-10-CM | POA: Diagnosis present

## 2016-07-26 DIAGNOSIS — R0602 Shortness of breath: Secondary | ICD-10-CM | POA: Diagnosis not present

## 2016-07-26 DIAGNOSIS — Z8249 Family history of ischemic heart disease and other diseases of the circulatory system: Secondary | ICD-10-CM | POA: Diagnosis not present

## 2016-07-26 DIAGNOSIS — Z832 Family history of diseases of the blood and blood-forming organs and certain disorders involving the immune mechanism: Secondary | ICD-10-CM | POA: Diagnosis not present

## 2016-07-26 DIAGNOSIS — F4321 Adjustment disorder with depressed mood: Secondary | ICD-10-CM | POA: Diagnosis present

## 2016-07-26 DIAGNOSIS — R0902 Hypoxemia: Secondary | ICD-10-CM | POA: Diagnosis not present

## 2016-07-26 DIAGNOSIS — F329 Major depressive disorder, single episode, unspecified: Secondary | ICD-10-CM | POA: Diagnosis not present

## 2016-07-26 DIAGNOSIS — F3289 Other specified depressive episodes: Secondary | ICD-10-CM | POA: Diagnosis not present

## 2016-07-26 DIAGNOSIS — Z23 Encounter for immunization: Secondary | ICD-10-CM

## 2016-07-26 DIAGNOSIS — R531 Weakness: Secondary | ICD-10-CM | POA: Diagnosis not present

## 2016-07-26 LAB — CBC WITH DIFFERENTIAL/PLATELET
Basophils Absolute: 0.1 10*3/uL (ref 0.0–0.1)
Basophils Relative: 1 %
Eosinophils Absolute: 0 10*3/uL (ref 0.0–0.7)
Eosinophils Relative: 0 %
HCT: 20.8 % — ABNORMAL LOW (ref 36.0–46.0)
Hemoglobin: 7.4 g/dL — ABNORMAL LOW (ref 12.0–15.0)
LYMPHS PCT: 32 %
Lymphs Abs: 1.9 10*3/uL (ref 0.7–4.0)
MCH: 38.7 pg — AB (ref 26.0–34.0)
MCHC: 37 g/dL — AB (ref 30.0–36.0)
MCV: 104.5 fL — ABNORMAL HIGH (ref 78.0–100.0)
MONO ABS: 1 10*3/uL (ref 0.1–1.0)
Monocytes Relative: 18 %
NEUTROS ABS: 2.8 10*3/uL (ref 1.7–7.7)
NRBC: 5 /100{WBCs} — AB
Neutrophils Relative %: 49 %
PLATELETS: 173 10*3/uL (ref 150–400)
RBC: 1.99 MIL/uL — AB (ref 3.87–5.11)
RDW: 20 % — ABNORMAL HIGH (ref 11.5–15.5)
WBC: 5.8 10*3/uL (ref 4.0–10.5)

## 2016-07-26 LAB — COMPREHENSIVE METABOLIC PANEL
ALK PHOS: 118 U/L (ref 38–126)
ALT: 42 U/L (ref 14–54)
ANION GAP: 5 (ref 5–15)
AST: 104 U/L — ABNORMAL HIGH (ref 15–41)
Albumin: 3 g/dL — ABNORMAL LOW (ref 3.5–5.0)
BILIRUBIN TOTAL: 11.8 mg/dL — AB (ref 0.3–1.2)
BUN: 6 mg/dL (ref 6–20)
CALCIUM: 8.6 mg/dL — AB (ref 8.9–10.3)
CO2: 23 mmol/L (ref 22–32)
Chloride: 108 mmol/L (ref 101–111)
Creatinine, Ser: 0.53 mg/dL (ref 0.44–1.00)
GFR calc Af Amer: >60 mL/min (ref >60)
GLUCOSE: 90 mg/dL (ref 65–99)
POTASSIUM: 3.7 mmol/L (ref 3.5–5.1)
Sodium: 136 mmol/L (ref 135–145)
TOTAL PROTEIN: 7.8 g/dL (ref 6.5–8.1)

## 2016-07-26 LAB — RETICULOCYTES
RBC.: 1.99 MIL/uL — AB (ref 3.87–5.11)
Retic Count, Absolute: 354.2 10*3/uL — ABNORMAL HIGH (ref 19.0–186.0)
Retic Ct Pct: 17.8 % — ABNORMAL HIGH (ref 0.4–3.1)

## 2016-07-26 MED ORDER — MORPHINE SULFATE ER 15 MG PO TBCR
15.0000 mg | EXTENDED_RELEASE_TABLET | Freq: Two times a day (BID) | ORAL | Status: DC
  Administered 2016-07-26 – 2016-08-04 (×18): 15 mg via ORAL
  Filled 2016-07-26 (×18): qty 1

## 2016-07-26 MED ORDER — ONDANSETRON HCL 4 MG/2ML IJ SOLN: 4.0000 mg | Freq: Four times a day (QID) | INTRAMUSCULAR | Status: DC | PRN

## 2016-07-26 MED ORDER — HYDROXYUREA 500 MG PO CAPS
1000.0000 mg | ORAL_CAPSULE | Freq: Every day | ORAL | Status: DC
  Administered 2016-07-27: 1000 mg via ORAL
  Filled 2016-07-26 (×2): qty 2

## 2016-07-26 MED ORDER — SODIUM CHLORIDE 0.9 % IV SOLN
25.0000 mg | INTRAVENOUS | Status: DC | PRN
  Filled 2016-07-26: qty 0.5

## 2016-07-26 MED ORDER — FOLIC ACID 1 MG PO TABS
1.0000 mg | ORAL_TABLET | Freq: Every day | ORAL | Status: DC
  Administered 2016-07-27 – 2016-08-04 (×9): 1 mg via ORAL
  Filled 2016-07-26 (×9): qty 1

## 2016-07-26 MED ORDER — DEXTROSE-NACL 5-0.45 % IV SOLN
INTRAVENOUS | Status: DC
  Administered 2016-07-26 – 2016-07-30 (×6): via INTRAVENOUS
  Administered 2016-07-30 – 2016-08-01 (×2): 1000 mL via INTRAVENOUS
  Administered 2016-08-03: 500 mL via INTRAVENOUS

## 2016-07-26 MED ORDER — PROMETHAZINE HCL 25 MG PO TABS
25.0000 mg | ORAL_TABLET | Freq: Four times a day (QID) | ORAL | Status: DC | PRN
  Administered 2016-07-26: 25 mg via ORAL
  Filled 2016-07-26: qty 1

## 2016-07-26 MED ORDER — POLYETHYLENE GLYCOL 3350 17 G PO PACK: 17.0000 g | PACK | Freq: Every day | ORAL | Status: DC | PRN

## 2016-07-26 MED ORDER — SENNOSIDES-DOCUSATE SODIUM 8.6-50 MG PO TABS
1.0000 | ORAL_TABLET | Freq: Two times a day (BID) | ORAL | Status: DC
  Administered 2016-07-26 – 2016-08-03 (×9): 1 via ORAL
  Filled 2016-07-26 (×18): qty 1

## 2016-07-26 MED ORDER — HYDROMORPHONE HCL 2 MG/ML IJ SOLN
2.0000 mg | INTRAMUSCULAR | Status: DC | PRN
  Administered 2016-07-26: 2 mg via INTRAVENOUS
  Filled 2016-07-26: qty 1

## 2016-07-26 MED ORDER — SODIUM CHLORIDE 0.9% FLUSH: 9.0000 mL | INTRAVENOUS | Status: DC | PRN

## 2016-07-26 MED ORDER — KETOROLAC TROMETHAMINE 30 MG/ML IJ SOLN
30.0000 mg | Freq: Four times a day (QID) | INTRAMUSCULAR | Status: AC
  Administered 2016-07-26 – 2016-07-31 (×20): 30 mg via INTRAVENOUS
  Filled 2016-07-26 (×18): qty 1

## 2016-07-26 MED ORDER — HYDROMORPHONE 1 MG/ML IV SOLN
INTRAVENOUS | Status: DC
  Administered 2016-07-26: 10:00:00 via INTRAVENOUS
  Administered 2016-07-26: 10.6 mg via INTRAVENOUS
  Filled 2016-07-26: qty 25

## 2016-07-26 MED ORDER — HYDROMORPHONE 1 MG/ML IV SOLN
INTRAVENOUS | Status: DC
  Administered 2016-07-26: 2.1 mg via INTRAVENOUS
  Administered 2016-07-26: 2.8 mg via INTRAVENOUS
  Administered 2016-07-26: 4.9 mg via INTRAVENOUS
  Administered 2016-07-27: 6.3 mg via INTRAVENOUS
  Administered 2016-07-27 (×2): 3.5 mg via INTRAVENOUS
  Administered 2016-07-27: 4.2 mg via INTRAVENOUS
  Administered 2016-07-27 – 2016-07-28 (×2): 9.1 mg via INTRAVENOUS
  Administered 2016-07-28: 6 mg via INTRAVENOUS
  Administered 2016-07-28: 14:00:00 via INTRAVENOUS
  Administered 2016-07-28: 4.9 mg via INTRAVENOUS
  Administered 2016-07-28: 4.2 mg via INTRAVENOUS
  Administered 2016-07-28 (×2): 2.8 mg via INTRAVENOUS
  Administered 2016-07-29: 4.9 mg via INTRAVENOUS
  Administered 2016-07-29: 12:00:00 via INTRAVENOUS
  Administered 2016-07-29: 3.5 mg via INTRAVENOUS
  Administered 2016-07-29: 4.2 mg via INTRAVENOUS
  Administered 2016-07-29: 10.5 mg via INTRAVENOUS
  Administered 2016-07-29: 0.7 mg via INTRAVENOUS
  Administered 2016-07-30: 4.2 mg via INTRAVENOUS
  Administered 2016-07-30: 4.9 mg via INTRAVENOUS
  Administered 2016-07-30: 11:00:00 via INTRAVENOUS
  Administered 2016-07-30: 2.1 mg via INTRAVENOUS
  Administered 2016-07-30: 14 mg via INTRAVENOUS
  Administered 2016-07-30: 4.9 mg via INTRAVENOUS
  Administered 2016-07-31: 25 mg via INTRAVENOUS
  Administered 2016-07-31: 3.5 mg via INTRAVENOUS
  Administered 2016-07-31: 9.1 mg via INTRAVENOUS
  Administered 2016-07-31: 10.5 mg via INTRAVENOUS
  Administered 2016-07-31: 3.5 mg via INTRAVENOUS
  Administered 2016-07-31: 2.8 mg via INTRAVENOUS
  Administered 2016-08-01: 5.6 mg via INTRAVENOUS
  Administered 2016-08-01: 3.5 mg via INTRAVENOUS
  Administered 2016-08-01: 06:00:00 via INTRAVENOUS
  Administered 2016-08-01: 1.4 mg via INTRAVENOUS
  Administered 2016-08-01: 3.1 mg via INTRAVENOUS
  Administered 2016-08-01: 2.1 mg via INTRAVENOUS
  Filled 2016-07-26 (×7): qty 25

## 2016-07-26 MED ORDER — NALOXONE HCL 0.4 MG/ML IJ SOLN: 0.4000 mg | INTRAMUSCULAR | Status: DC | PRN

## 2016-07-26 MED ORDER — HYDROMORPHONE HCL 2 MG/ML IJ SOLN
2.0000 mg | INTRAMUSCULAR | Status: DC
  Administered 2016-07-26 – 2016-08-01 (×70): 2 mg via INTRAVENOUS
  Filled 2016-07-26 (×70): qty 1

## 2016-07-26 MED ORDER — PROMETHAZINE HCL 25 MG PO TABS
25.0000 mg | ORAL_TABLET | Freq: Four times a day (QID) | ORAL | Status: DC | PRN
  Administered 2016-07-27 – 2016-08-04 (×14): 25 mg via ORAL
  Filled 2016-07-26 (×14): qty 1

## 2016-07-26 MED ORDER — DIPHENHYDRAMINE HCL 25 MG PO CAPS
25.0000 mg | ORAL_CAPSULE | ORAL | Status: DC | PRN
  Administered 2016-07-26: 50 mg via ORAL
  Administered 2016-07-26: 25 mg via ORAL
  Administered 2016-07-27 – 2016-08-03 (×14): 50 mg via ORAL
  Administered 2016-08-04: 25 mg via ORAL
  Filled 2016-07-26 (×4): qty 2
  Filled 2016-07-26: qty 1
  Filled 2016-07-26 (×11): qty 2
  Filled 2016-07-26: qty 1

## 2016-07-26 NOTE — Progress Notes (Signed)
Patient ID: Dawn [name], female   DOB: [birth year], 35 y.o.   MRN: [id number] Pt transferred to Hospital from [facility] with sickle cell crisis. Pt reports pain 8/10 and localized in the B/L flank region and legs. Labs reviewed Bilirubin 11.8. Will check LDH. Otherwise labs at baseline. Orders reviewed and adjusted to scheduled clinician assisted doses.   Dawn [name] A.

## 2016-07-26 NOTE — Progress Notes (Signed)
Patient ID: Dawn [name], female   DOB: 06-04-1981, 35 y.o.   MRN: [id number]   Patient being transferred to 1322.  Patient still with C/O pain to legs and back that she rates 7/10 on pain scale.  Belongings packed. Patient transported by via wheelchair.

## 2016-07-26 NOTE — H&P (Signed)
[facility] History and Physical  Dawn [name] [id number] DOB: 26-Apr-1981 DOA: 07/26/2016  PCP: Angelica Chessman, MD   Chief Complaint: Pain in both legs  HPI: Dawn [name] is a 35 y.o. female with history of sickle cell disease, opiate tolerant. She presents to the day hospital today for a pain crisis that started 3 days ago with pain in her legs and lower back rated at 8/10, constant, aching and throbbing. She was in the ED on 07/20/2016 for the same pain. She took her home pain medications this morning without sustained relief. She reports nothing different about this episode. She denies chest pain, cough, SOB. No N/V/D, no abd pain, no dysuria, no fever or chils. She reports taking her hydrea and folic acid regularly.   Systemic Review: General: The patient denies anorexia, fever, weight loss Cardiac: Denies chest pain, syncope, palpitations, pedal edema  Respiratory: Denies cough, shortness of breath, wheezing GI: Denies severe indigestion/heartburn, abdominal pain, nausea, vomiting, diarrhea and constipation GU: Denies hematuria, incontinence, dysuria  Musculoskeletal: Denies arthritis  Skin: Denies suspicious skin lesions Neurologic: Denies focal weakness or numbness, change in vision  Past Medical History:  Diagnosis Date  . Blood transfusion   . Demand ischemia ([place]) 02/06/2016  . HCAP (healthcare-associated pneumonia) 05/19/2013  . 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 Pulmonary hypertension 02/06/2016   49 mmHg per echo on 02/06/16   . 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 Sickle cell anemia (HCC)   . Sickle cell disease ([place])   . Sickle cell disease, type S ([place])   . Vitamin B12 deficiency 07/07/2015    Past Surgical History:  Procedure Laterality Date  .  left knee ACL reconstruction    . CESAREAN SECTION     x 2  . CHOLECYSTECTOMY    . IR GENERIC HISTORICAL  07/17/2016   IR FLUORO GUIDE PORT INSERTION RIGHT 07/17/2016 Aletta Edouard, MD  [facility]  . IR GENERIC HISTORICAL  07/17/2016   IR US GUIDE VASC ACCESS RIGHT 07/17/2016 Aletta Edouard, MD [facility]  . PORT-A-CATH REMOVAL Left 07/29/2013   Procedure: REMOVAL PORT-A-CATH;  Surgeon: Scherry Ran, MD;  Location: [facility] OR[facility];  Service: General;  Laterality: Left;  . PORTACATH PLACEMENT    . PORTACATH PLACEMENT Left 02/23/2013   Procedure: INSERTION PORT-A-CATH;  Surgeon: Donato Heinz, MD;  Location: [facility];  Service: General;  Laterality: Left;  . TEE WITHOUT CARDIOVERSION N/A 08/07/2013   Procedure: TRANSESOPHAGEAL ECHOCARDIOGRAM (TEE);  Surgeon: Arnoldo Lenis, MD;  Location: [facility] SU[facility];  Service: Cardiology;  Laterality: N/A;    Allergies  Allergen Reactions  . Desferal [Deferoxamine] Hives    Local reaction on arm only during infusion. Can take with benadryl   . Latex Other (See Comments)    REACTION: Pt experiences a burning sensation on contacted skin areas  . Lisinopril Other (See Comments) and Cough    REACTION: Sore/scratchy throat  . Tape Other (See Comments)    REACTION: Pt. Experiences a burning sensation on contacted skin areas  . Tylenol [Acetaminophen] Other (See Comments)    Pt. Does not take the following due to protection of kidney health. Past occurrence of Protein in urine.    Family History  Problem Relation Age of Onset  . Sickle cell trait Mother   . Sickle cell trait Father   . Hypertension Father   . Diabetes Father   . Sickle cell trait Sister   . Cancer Paternal Aunt  Breast      Prior to Admission medications   Medication Sig Start Date End Date Taking? Authorizing Provider  folic acid (FOLVITE) 1 MG tablet Take 1 tablet (1 mg total) by mouth daily. 03/14/16   [name], FNP  hydroxyurea (HYDREA) 500 MG capsule Take 2 capsules (1,000 mg total) by mouth daily. May take with food to minimize GI side effects. 03/14/16   [name], FNP  levonorgestrel (MIRENA) 20 MCG/24HR IUD 1 each by Intrauterine route  once.    Historical Provider, MD  loratadine (CLARITIN) 10 MG tablet Take 10 mg by mouth daily.    Historical Provider, MD  morphine (MS CONTIN) 15 MG 12 hr tablet Take 1 tablet (15 mg total) by mouth every 12 (twelve) hours. 07/20/16 08/19/16  Tresa Garter, MD  Multiple Vitamin (MULTIVITAMIN WITH MINERALS) TABS tablet Take 1 tablet by mouth daily.    Historical Provider, MD  Oxycodone HCl 10 MG TABS Take 1 tablet (10 mg total) by mouth every 4 (four) hours as needed (breakthrough pain). 07/20/16 08/04/16  Tresa Garter, MD  promethazine (PHENERGAN) 25 MG tablet Take 1 tablet (25 mg total) by mouth every 6 (six) hours as needed for nausea or vomiting. 07/20/16   Tresa Garter, MD  Vitamin D, Ergocalciferol, (DRISDOL) 50000 units CAPS capsule Take 1 capsule (50,000 Units total) by mouth every 7 (seven) days. Takes on Sundays. 06/01/16   Micheline Chapman, NP     Physical Exam: There were no vitals filed for this visit.  General: Alert, awake, afebrile, icteric ++, mild to moderate pain distress HEENT: Normocephalic and Atraumatic, Mucous membranes pink                PERRLA; EOM intact; No scleral icterus,                 Nares: Patent, Oropharynx: Clear, Fair Dentition                 Neck: FROM, no cervical lymphadenopathy, thyromegaly, carotid bruit or JVD;  CHEST WALL: No tenderness  CHEST: Normal respiration, clear to auscultation bilaterally  HEART: Regular rate and rhythm; no murmurs rubs or gallops  BACK: No kyphosis or scoliosis; no CVA tenderness  ABDOMEN: Positive Bowel Sounds, soft, non-tender; no masses, no organomegaly EXTREMITIES: No cyanosis, clubbing, or edema SKIN:  no rash or ulceration  CNS: Alert and Oriented x 4, Nonfocal exam, CN 2-12 intact  Labs on Admission:  Basic Metabolic Panel:  Recent Labs Lab 07/20/16 1400  NA 136  K 3.8  CL 109  CO2 20*  GLUCOSE 82  BUN 6  CREATININE 0.48  CALCIUM 8.8*   Liver Function Tests:  Recent  Labs Lab 07/20/16 1400  AST 120*  ALT 46  ALKPHOS 117  BILITOT 10.4*  PROT 8.1  ALBUMIN 3.0*   No results for input(s): LIPASE, AMYLASE in the last 168 hours. No results for input(s): AMMONIA in the last 168 hours. CBC:  Recent Labs Lab 07/20/16 1400  WBC 7.2  NEUTROABS 3.5  HGB 8.4*  HCT 22.8*  MCV 104.6*  PLT 245   Cardiac Enzymes: No results for input(s): CKTOTAL, CKMB, CKMBINDEX, TROPONINI in the last 168 hours.  BNP (last 3 results) No results for input(s): BNP in the last 8760 hours.  ProBNP (last 3 results) No results for input(s): PROBNP in the last 8760 hours.  CBG: No results for input(s): GLUCAP in the last 168 hours.   Assessment/Plan Active  Problems:   Sickle cell anemia with pain (HCC)   [facility] the Day [facility]  IVF D5 .45% Saline @ 125 mls/hour  Weight based Dilaudid PCA started within 30 minutes of admission plus clinician assisted doses  IV Toradol 30 mg Q 6 H  Monitor vitals very closely, Re-evaluate pain scale every hour  2 L of Oxygen by [place]  Patient will be re-evaluated for pain in the context of function and relationship to baseline as care progresses.  If no significant relieve from pain (remains above 5/10) will transfer patient to inpatient services for further evaluation and management  Code Status: Full  Family Communication: None  DVT Prophylaxis: Ambulate as tolerated   Time spent: 56 Minutes  Toa Mia, MD, MHA, FACP, FAAP, CPE  If 7PM-7AM, please contact night-coverage [email or web address] 07/26/2016, 9:16 AM

## 2016-07-27 DIAGNOSIS — D5700 Hb-SS disease with crisis, unspecified: Principal | ICD-10-CM

## 2016-07-27 DIAGNOSIS — D638 Anemia in other chronic diseases classified elsewhere: Secondary | ICD-10-CM

## 2016-07-27 LAB — CBC WITH DIFFERENTIAL/PLATELET
Basophils Absolute: 0.1 10*3/uL (ref 0.0–0.1)
Basophils Relative: 1 %
EOS ABS: 0.2 10*3/uL (ref 0.0–0.7)
Eosinophils Relative: 2 %
HCT: 17.4 % — ABNORMAL LOW (ref 36.0–46.0)
HEMOGLOBIN: 6.7 g/dL — AB (ref 12.0–15.0)
Lymphocytes Relative: 24 %
Lymphs Abs: 2.4 10*3/uL (ref 0.7–4.0)
MCH: 39.9 pg — AB (ref 26.0–34.0)
MCHC: 38.5 g/dL — ABNORMAL HIGH (ref 30.0–36.0)
MCV: 103.6 fL — AB (ref 78.0–100.0)
MONOS PCT: 13 %
Monocytes Absolute: 1.3 10*3/uL — ABNORMAL HIGH (ref 0.1–1.0)
NEUTROS ABS: 5.9 10*3/uL (ref 1.7–7.7)
NRBC: 3 /100{WBCs} — AB
Neutrophils Relative %: 60 %
PLATELETS: 142 10*3/uL — AB (ref 150–400)
RBC: 1.68 MIL/uL — ABNORMAL LOW (ref 3.87–5.11)
RDW: 22.7 % — ABNORMAL HIGH (ref 11.5–15.5)
WBC: 9.9 10*3/uL (ref 4.0–10.5)

## 2016-07-27 LAB — URINALYSIS, ROUTINE W REFLEX MICROSCOPIC
GLUCOSE, UA: NEGATIVE mg/dL
Ketones, ur: NEGATIVE mg/dL
Nitrite: POSITIVE — AB
PROTEIN: 30 mg/dL — AB
Specific Gravity, Urine: 1.014 (ref 1.005–1.030)
pH: 5.5 (ref 5.0–8.0)

## 2016-07-27 LAB — URINE MICROSCOPIC-ADD ON

## 2016-07-27 LAB — RETICULOCYTES
RBC.: 1.68 MIL/uL — ABNORMAL LOW (ref 3.87–5.11)
Retic Ct Pct: >23 % — ABNORMAL HIGH (ref 0.4–3.1)

## 2016-07-27 LAB — LACTATE DEHYDROGENASE: LDH: 311 U/L — AB (ref 98–192)

## 2016-07-27 MED ORDER — HYDROXYUREA 500 MG PO CAPS
1500.0000 mg | ORAL_CAPSULE | ORAL | Status: DC
  Administered 2016-07-28: 1500 mg via ORAL
  Filled 2016-07-27: qty 3

## 2016-07-27 MED ORDER — ENOXAPARIN SODIUM 40 MG/0.4ML ~~LOC~~ SOLN
40.0000 mg | SUBCUTANEOUS | Status: DC
  Administered 2016-07-27 – 2016-07-31 (×5): 40 mg via SUBCUTANEOUS
  Filled 2016-07-27 (×5): qty 0.4

## 2016-07-27 MED ORDER — HYDROXYUREA 500 MG PO CAPS
1000.0000 mg | ORAL_CAPSULE | ORAL | Status: DC
  Administered 2016-07-29: 1000 mg via ORAL
  Filled 2016-07-27: qty 2

## 2016-07-27 NOTE — Care Management Note (Signed)
Case Management Note  Patient Details  Name: Dawn [name] MRN: [id number] Date of Birth: [birth year]  Subjective/Objective:      35 yo admitted with SCC.              Action/Plan: From home with family. Chart reviewed and CM following for DC needs.  Expected Discharge Date:   (unknown)               Expected Discharge Plan:  Home/Self Care  In-House Referral:     Discharge planning Services  CM Consult  Post Acute Care Choice:    Choice offered to:     DME Arranged:    DME Agency:     HH Arranged:    HH Agency:     Status of Service:  In process, will continue to follow  If discussed at Long Length of Stay Meetings, dates discussed:    Additional CommentsLynnell Catalan, RN 07/27/2016, 12:29 PM  [phone]

## 2016-07-27 NOTE — Progress Notes (Signed)
CRITICAL VALUE ALERT  Critical value received:  Hgb 6.7 Date of notification: 07/27/2016  Time of notification:  1300   Critical value read back:Yes.    Nurse who received alert:  Marta Lamas RN  MD notified (1st page):  Dr. Zigmund Daniel  Time of first page:  1300  MD notified (2nd page):  Time of second page:  Responding MD:  Dr. Zigmund Daniel  Time MD responded:  1300

## 2016-07-27 NOTE — Progress Notes (Signed)
SICKLE CELL SERVICE PROGRESS NOTE  Dawn [name] [id number] DOB: 19-Apr-1981 DOA: 07/26/2016 PCP: Angelica Chessman, MD  Assessment/Plan: Active Problems:   Sickle cell anemia with crisis ([place])   Sickle cell anemia with pain ([place])  1. Hb SS with crisis: Continue PCA, Clinician Assisted Doses and Toradol at current doses. Decrease IVF to 75 ml/hr.  2. Anemia of Chronic Disease: Hb stable. Pt reports that she was previously taking an alternating 1500 and 1000 mg of Hydrea but it was decreased more than 1 year ago. She feels that since her dose was decreased, she has had more crises. I will give a trial of increased dose and assess tolerance of dose while hospitalized.  3. Chronic pain: Continue MS Contin. 4. DVT Prophylaxis: Lovenox.  Code Status: Full Code Family Communication: N/A Disposition Plan: Not yet ready for discharge  Reading.  Pager [phone]. If 7PM-7AM, please contact night-coverage.  07/27/2016, 2:07 PM  LOS: 1 day   Interim History: Pt reports pain at intensity of 7/10 and localized to legs and back.   Consultants:  None  Procedures:  None  Antibiotics:  None    Objective: Vitals:   07/27/16 0611 07/27/16 0805 07/27/16 0905 07/27/16 1308  BP:   (!) 143/79   Pulse:   99   Resp: 12 11 17 15   Temp:   97.4 F (36.3 C)   TempSrc:   Oral   SpO2: 93% 96% 97% 100%  Weight:      Height:       Weight change:   Intake/Output Summary (Last 24 hours) at 07/27/16 1407 Last data filed at 07/27/16 0905  Gross per 24 hour  Intake              640 ml  Output              100 ml  Net              540 ml    General: Alert, awake, oriented x3, in no acute distress.  HEENT: [place]/AT PEERL, EOMI, icterus. Neck: Trachea midline,  no masses, no thyromegal,y no JVD, no carotid bruit OROPHARYNX:  Moist, No exudate/ erythema/lesions.  Heart: Regular rate and rhythm, without murmurs, rubs, gallops, PMI non-displaced, no heaves or thrills on palpation.   Lungs: Clear to auscultation, no wheezing or rhonchi noted. No increased vocal fremitus resonant to percussion  Abdomen: Soft, nontender, nondistended, positive bowel sounds, no masses no hepatosplenomegaly noted..  Neuro: No focal neurological deficits noted cranial nerves II through XII grossly intact. Strength at baseline in bilateral upper and lower extremities. Musculoskeletal: No warmth swelling or erythema around joints, no spinal tenderness noted. Psychiatric: Patient alert and oriented x3, good insight and cognition, good recent to remote recall.    Data Reviewed: Basic Metabolic Panel:  Recent Labs Lab 07/26/16 0938  NA 136  K 3.7  CL 108  CO2 23  GLUCOSE 90  BUN 6  CREATININE 0.53  CALCIUM 8.6*   Liver Function Tests:  Recent Labs Lab 07/26/16 0938  AST 104*  ALT 42  ALKPHOS 118  BILITOT 11.8*  PROT 7.8  ALBUMIN 3.0*   No results for input(s): LIPASE, AMYLASE in the last 168 hours. No results for input(s): AMMONIA in the last 168 hours. CBC:  Recent Labs Lab 07/26/16 0938 07/27/16 1225  WBC 5.8 9.9  NEUTROABS 2.8 5.9  HGB 7.4* 6.7*  HCT 20.8* 17.4*  MCV 104.5* 103.6*  PLT 173 142*   Cardiac Enzymes:  No results for input(s): CKTOTAL, CKMB, CKMBINDEX, TROPONINI in the last 168 hours. BNP (last 3 results) No results for input(s): BNP in the last 8760 hours.  ProBNP (last 3 results) No results for input(s): PROBNP in the last 8760 hours.  CBG: No results for input(s): GLUCAP in the last 168 hours.  No results found for this or any previous visit (from the past 240 hour(s)).   Studies: Ir US Guide Vasc Access Right  Result Date: 07/17/2016 CLINICAL DATA:  History of sickle cell anemia, poor IV access and prior port placements. The patient requires a new Port-A-Cath. EXAM: IMPLANTED PORT A CATH PLACEMENT WITH ULTRASOUND AND FLUOROSCOPIC GUIDANCE ANESTHESIA/SEDATION: 10.0 mg IV Versed; 200 mcg IV Fentanyl Total Moderate Sedation Time:  42  minutes The patient's level of consciousness and physiologic status were continuously monitored during the procedure by Radiology nursing. Additional Medications: 2 g IV Ancef. As antibiotic prophylaxis, Ancef was ordered pre-procedure and administered intravenously within one hour of incision. FLUOROSCOPY TIME:  18 seconds.  4.0 mGy. PROCEDURE: The procedure, risks, benefits, and alternatives were explained to the patient. Questions regarding the procedure were encouraged and answered. The patient understands and consents to the procedure. A time-out was performed prior to initiating the procedure. Ultrasound was utilized to confirm patency of the right internal jugular vein. The right neck and chest were prepped with chlorhexidine in a sterile fashion, and a sterile drape was applied covering the operative field. Maximum barrier sterile technique with sterile gowns and gloves were used for the procedure. Local anesthesia was provided with 1% lidocaine. After creating a small venotomy incision, a 21 gauge needle was advanced into the right internal jugular vein under direct, real-time ultrasound guidance. Ultrasound image documentation was performed. After securing guidewire access, an 8 Fr dilator was placed. A J-wire was kinked to measure appropriate catheter length. A subcutaneous port pocket was then created along the upper chest wall utilizing sharp and blunt dissection. Portable cautery was utilized. The pocket was irrigated with sterile saline. A single lumen power injectable port was chosen for placement. The 8 Fr catheter was tunneled from the port pocket site to the venotomy incision. The port was placed in the pocket. External catheter was trimmed to appropriate length based on guidewire measurement. At the venotomy, an 8 Fr peel-away sheath was placed over a guidewire. The catheter was then placed through the sheath and the sheath removed. Final catheter positioning was confirmed and documented with a  fluoroscopic spot image. The port was accessed with a needle and aspirated and flushed with heparinized saline. The access needle was removed. The venotomy and port pocket incisions were closed with subcutaneous 3-0 Monocryl and subcuticular 4-0 Vicryl. Dermabond was applied to both incisions. COMPLICATIONS: COMPLICATIONS None FINDINGS: After catheter placement, the tip lies at the cavo-atrial junction. The catheter aspirates normally and is ready for immediate use. IMPRESSION: Placement of single lumen port a cath via right internal jugular vein. The catheter tip lies at the cavo-atrial junction. A power injectable port a cath was placed and is ready for immediate use. Electronically Signed   By: Aletta Edouard M.D.   On: 07/17/2016 16:36   Ir Fluoro Guide Port Insertion Right  Result Date: 07/17/2016 CLINICAL DATA:  History of sickle cell anemia, poor IV access and prior port placements. The patient requires a new Port-A-Cath. EXAM: IMPLANTED PORT A CATH PLACEMENT WITH ULTRASOUND AND FLUOROSCOPIC GUIDANCE ANESTHESIA/SEDATION: 10.0 mg IV Versed; 200 mcg IV Fentanyl Total Moderate Sedation Time:  42 minutes  The patient's level of consciousness and physiologic status were continuously monitored during the procedure by Radiology nursing. Additional Medications: 2 g IV Ancef. As antibiotic prophylaxis, Ancef was ordered pre-procedure and administered intravenously within one hour of incision. FLUOROSCOPY TIME:  18 seconds.  4.0 mGy. PROCEDURE: The procedure, risks, benefits, and alternatives were explained to the patient. Questions regarding the procedure were encouraged and answered. The patient understands and consents to the procedure. A time-out was performed prior to initiating the procedure. Ultrasound was utilized to confirm patency of the right internal jugular vein. The right neck and chest were prepped with chlorhexidine in a sterile fashion, and a sterile drape was applied covering the operative field.  Maximum barrier sterile technique with sterile gowns and gloves were used for the procedure. Local anesthesia was provided with 1% lidocaine. After creating a small venotomy incision, a 21 gauge needle was advanced into the right internal jugular vein under direct, real-time ultrasound guidance. Ultrasound image documentation was performed. After securing guidewire access, an 8 Fr dilator was placed. A J-wire was kinked to measure appropriate catheter length. A subcutaneous port pocket was then created along the upper chest wall utilizing sharp and blunt dissection. Portable cautery was utilized. The pocket was irrigated with sterile saline. A single lumen power injectable port was chosen for placement. The 8 Fr catheter was tunneled from the port pocket site to the venotomy incision. The port was placed in the pocket. External catheter was trimmed to appropriate length based on guidewire measurement. At the venotomy, an 8 Fr peel-away sheath was placed over a guidewire. The catheter was then placed through the sheath and the sheath removed. Final catheter positioning was confirmed and documented with a fluoroscopic spot image. The port was accessed with a needle and aspirated and flushed with heparinized saline. The access needle was removed. The venotomy and port pocket incisions were closed with subcutaneous 3-0 Monocryl and subcuticular 4-0 Vicryl. Dermabond was applied to both incisions. COMPLICATIONS: COMPLICATIONS None FINDINGS: After catheter placement, the tip lies at the cavo-atrial junction. The catheter aspirates normally and is ready for immediate use. IMPRESSION: Placement of single lumen port a cath via right internal jugular vein. The catheter tip lies at the cavo-atrial junction. A power injectable port a cath was placed and is ready for immediate use. Electronically Signed   By: Aletta Edouard M.D.   On: 07/17/2016 16:36    Scheduled Meds: . enoxaparin (LOVENOX) injection  40 mg Subcutaneous  Q24H  . folic acid  1 mg Oral Daily  . HYDROmorphone   Intravenous Q4H  .  HYDROmorphone (DILAUDID) injection  2 mg Intravenous Q2H  . hydroxyurea  1,000 mg Oral Daily  . ketorolac  30 mg Intravenous Q6H  . morphine  15 mg Oral Q12H  . senna-docusate  1 tablet Oral BID   Continuous Infusions: . dextrose 5 % and 0.45% NaCl 125 mL/hr at 07/27/16 0231    Active Problems:   Sickle cell anemia with crisis (HCC)   Sickle cell anemia with pain (HCC)    In excess of 25 minutes spent during this visit. Greater than 50% involved face to face contact with the patient for assessment, counseling and coordination of care.

## 2016-07-28 NOTE — Progress Notes (Signed)
SICKLE CELL SERVICE PROGRESS NOTE  Dawn [name] [id number] DOB: [birth year] DOA: 07/26/2016 PCP: Angelica Chessman, MD  Assessment/Plan: Active Problems:   Sickle cell anemia with crisis ([place])   Sickle cell anemia with pain ([place])  1. Hb SS with crisis: Continue PCA, Clinician Assisted Doses and Toradol at current doses. Mobilize patient today. 2. Anemia of Chronic Disease: Hb stable. Continue hydroxyurea at 1500 mg dosing. Re-check labs in the morning. 3. Chronic pain: Continue MS Contin. 4. DVT Prophylaxis: Lovenox.  Code Status: Full Code Family Communication: N/A Disposition Plan: Not yet ready for discharge  [facility]  Pager [phone]. If 7PM-7AM, please contact night-coverage.  07/28/2016, 11:35 AM  LOS: 2 days   Interim History: Pt reports pain at intensity of 6/10 and localized to legs and back.   Consultants:  None  Procedures:  None  Antibiotics:  None    Objective: Vitals:   07/28/16 0000 07/28/16 0400 07/28/16 0448 07/28/16 0744  BP:   (!) 147/82   Pulse:   (!) 110   Resp: 20 13 20 18   Temp:   98.4 F (36.9 C)   TempSrc:   Oral   SpO2: 93% 94% 95% 100%  Weight:      Height:       Weight change:   Intake/Output Summary (Last 24 hours) at 07/28/16 1135 Last data filed at 07/28/16 0926  Gross per 24 hour  Intake              640 ml  Output                0 ml  Net              640 ml    General: Alert, awake, oriented x3, in no acute distress.  HEENT: /AT PEERL, EOMI, icterus. Neck: Trachea midline,  no masses, no thyromegal,y no JVD, no carotid bruit OROPHARYNX:  Moist, No exudate/ erythema/lesions.  Heart: Regular rate and rhythm, without murmurs, rubs, gallops, PMI non-displaced, no heaves or thrills on palpation.  Lungs: Clear to auscultation, no wheezing or rhonchi noted. No increased vocal fremitus resonant to percussion  Abdomen: Soft, nontender, nondistended, positive bowel sounds, no masses no hepatosplenomegaly noted..  Neuro:  No focal neurological deficits noted cranial nerves II through XII grossly intact. Strength at baseline in bilateral upper and lower extremities. Musculoskeletal: No warmth swelling or erythema around joints, no spinal tenderness noted. Psychiatric: Patient alert and oriented x3, good insight and cognition, good recent to remote recall.    Data Reviewed: Basic Metabolic Panel:  Recent Labs Lab 07/26/16 0938  NA 136  K 3.7  CL 108  CO2 23  GLUCOSE 90  BUN 6  CREATININE 0.53  CALCIUM 8.6*   Liver Function Tests:  Recent Labs Lab 07/26/16 0938  AST 104*  ALT 42  ALKPHOS 118  BILITOT 11.8*  PROT 7.8  ALBUMIN 3.0*   No results for input(s): LIPASE, AMYLASE in the last 168 hours. No results for input(s): AMMONIA in the last 168 hours. CBC:  Recent Labs Lab 07/26/16 0938 07/27/16 1225  WBC 5.8 9.9  NEUTROABS 2.8 5.9  HGB 7.4* 6.7*  HCT 20.8* 17.4*  MCV 104.5* 103.6*  PLT 173 142*   Cardiac Enzymes: No results for input(s): CKTOTAL, CKMB, CKMBINDEX, TROPONINI in the last 168 hours. BNP (last 3 results) No results for input(s): BNP in the last 8760 hours.  ProBNP (last 3 results) No results for input(s): PROBNP in the last 8760 hours.  CBG: No results for input(s): GLUCAP in the last 168 hours.  No results found for this or any previous visit (from the past 240 hour(s)).   Studies: Ir US Guide Vasc Access Right  Result Date: 07/17/2016 CLINICAL DATA:  History of sickle cell anemia, poor IV access and prior port placements. The patient requires a new Port-A-Cath. EXAM: IMPLANTED PORT A CATH PLACEMENT WITH ULTRASOUND AND FLUOROSCOPIC GUIDANCE ANESTHESIA/SEDATION: 10.0 mg IV Versed; 200 mcg IV Fentanyl Total Moderate Sedation Time:  42 minutes The patient's level of consciousness and physiologic status were continuously monitored during the procedure by Radiology nursing. Additional Medications: 2 g IV Ancef. As antibiotic prophylaxis, Ancef was ordered  pre-procedure and administered intravenously within one hour of incision. FLUOROSCOPY TIME:  18 seconds.  4.0 mGy. PROCEDURE: The procedure, risks, benefits, and alternatives were explained to the patient. Questions regarding the procedure were encouraged and answered. The patient understands and consents to the procedure. A time-out was performed prior to initiating the procedure. Ultrasound was utilized to confirm patency of the right internal jugular vein. The right neck and chest were prepped with chlorhexidine in a sterile fashion, and a sterile drape was applied covering the operative field. Maximum barrier sterile technique with sterile gowns and gloves were used for the procedure. Local anesthesia was provided with 1% lidocaine. After creating a small venotomy incision, a 21 gauge needle was advanced into the right internal jugular vein under direct, real-time ultrasound guidance. Ultrasound image documentation was performed. After securing guidewire access, an 8 Fr dilator was placed. A J-wire was kinked to measure appropriate catheter length. A subcutaneous port pocket was then created along the upper chest wall utilizing sharp and blunt dissection. Portable cautery was utilized. The pocket was irrigated with sterile saline. A single lumen power injectable port was chosen for placement. The 8 Fr catheter was tunneled from the port pocket site to the venotomy incision. The port was placed in the pocket. External catheter was trimmed to appropriate length based on guidewire measurement. At the venotomy, an 8 Fr peel-away sheath was placed over a guidewire. The catheter was then placed through the sheath and the sheath removed. Final catheter positioning was confirmed and documented with a fluoroscopic spot image. The port was accessed with a needle and aspirated and flushed with heparinized saline. The access needle was removed. The venotomy and port pocket incisions were closed with subcutaneous 3-0  Monocryl and subcuticular 4-0 Vicryl. Dermabond was applied to both incisions. COMPLICATIONS: COMPLICATIONS None FINDINGS: After catheter placement, the tip lies at the cavo-atrial junction. The catheter aspirates normally and is ready for immediate use. IMPRESSION: Placement of single lumen port a cath via right internal jugular vein. The catheter tip lies at the cavo-atrial junction. A power injectable port a cath was placed and is ready for immediate use. Electronically Signed   By: Aletta Edouard M.D.   On: 07/17/2016 16:36   Ir Fluoro Guide Port Insertion Right  Result Date: 07/17/2016 CLINICAL DATA:  History of sickle cell anemia, poor IV access and prior port placements. The patient requires a new Port-A-Cath. EXAM: IMPLANTED PORT A CATH PLACEMENT WITH ULTRASOUND AND FLUOROSCOPIC GUIDANCE ANESTHESIA/SEDATION: 10.0 mg IV Versed; 200 mcg IV Fentanyl Total Moderate Sedation Time:  42 minutes The patient's level of consciousness and physiologic status were continuously monitored during the procedure by Radiology nursing. Additional Medications: 2 g IV Ancef. As antibiotic prophylaxis, Ancef was ordered pre-procedure and administered intravenously within one hour of incision. FLUOROSCOPY TIME:  18 seconds.  4.0 mGy. PROCEDURE: The procedure, risks, benefits, and alternatives were explained to the patient. Questions regarding the procedure were encouraged and answered. The patient understands and consents to the procedure. A time-out was performed prior to initiating the procedure. Ultrasound was utilized to confirm patency of the right internal jugular vein. The right neck and chest were prepped with chlorhexidine in a sterile fashion, and a sterile drape was applied covering the operative field. Maximum barrier sterile technique with sterile gowns and gloves were used for the procedure. Local anesthesia was provided with 1% lidocaine. After creating a small venotomy incision, a 21 gauge needle was advanced  into the right internal jugular vein under direct, real-time ultrasound guidance. Ultrasound image documentation was performed. After securing guidewire access, an 8 Fr dilator was placed. A J-wire was kinked to measure appropriate catheter length. A subcutaneous port pocket was then created along the upper chest wall utilizing sharp and blunt dissection. Portable cautery was utilized. The pocket was irrigated with sterile saline. A single lumen power injectable port was chosen for placement. The 8 Fr catheter was tunneled from the port pocket site to the venotomy incision. The port was placed in the pocket. External catheter was trimmed to appropriate length based on guidewire measurement. At the venotomy, an 8 Fr peel-away sheath was placed over a guidewire. The catheter was then placed through the sheath and the sheath removed. Final catheter positioning was confirmed and documented with a fluoroscopic spot image. The port was accessed with a needle and aspirated and flushed with heparinized saline. The access needle was removed. The venotomy and port pocket incisions were closed with subcutaneous 3-0 Monocryl and subcuticular 4-0 Vicryl. Dermabond was applied to both incisions. COMPLICATIONS: COMPLICATIONS None FINDINGS: After catheter placement, the tip lies at the cavo-atrial junction. The catheter aspirates normally and is ready for immediate use. IMPRESSION: Placement of single lumen port a cath via right internal jugular vein. The catheter tip lies at the cavo-atrial junction. A power injectable port a cath was placed and is ready for immediate use. Electronically Signed   By: Aletta Edouard M.D.   On: 07/17/2016 16:36    Scheduled Meds: . enoxaparin (LOVENOX) injection  40 mg Subcutaneous Q24H  . folic acid  1 mg Oral Daily  . HYDROmorphone   Intravenous Q4H  .  HYDROmorphone (DILAUDID) injection  2 mg Intravenous Q2H  . hydroxyurea  1,500 mg Oral QODAY   And  . [START ON 07/29/2016] hydroxyurea   1,000 mg Oral QODAY  . ketorolac  30 mg Intravenous Q6H  . morphine  15 mg Oral Q12H  . senna-docusate  1 tablet Oral BID   Continuous Infusions: . dextrose 5 % and 0.45% NaCl 75 mL/hr at 07/27/16 1400    Active Problems:   Sickle cell anemia with crisis ([facility])   Sickle cell anemia with pain ([facility])    In excess of 25 minutes spent during this visit. Greater than 50% involved face to face contact with the patient for assessment, counseling and coordination of care.

## 2016-07-29 LAB — RETICULOCYTES
RBC.: 1.34 MIL/uL — AB (ref 3.87–5.11)
Retic Count, Absolute: 308.2 10*3/uL — ABNORMAL HIGH (ref 19.0–186.0)
Retic Ct Pct: 23 % — ABNORMAL HIGH (ref 0.4–3.1)

## 2016-07-29 LAB — PREPARE RBC (CROSSMATCH)

## 2016-07-29 MED ORDER — SALINE SPRAY 0.65 % NA SOLN
1.0000 | NASAL | Status: DC | PRN
  Filled 2016-07-29: qty 44

## 2016-07-29 MED ORDER — SODIUM CHLORIDE 0.9 % IV SOLN
Freq: Once | INTRAVENOUS | Status: AC
  Administered 2016-07-30: 11:00:00 via INTRAVENOUS

## 2016-07-29 MED ORDER — PHENOL 1.4 % MT LIQD
1.0000 | OROMUCOSAL | Status: DC | PRN
  Filled 2016-07-29: qty 177

## 2016-07-29 NOTE — Progress Notes (Signed)
PHARMACY BRIEF NOTE: HYDROXYUREA   By [facility] policy, hydroxyurea is automatically held when any of the following laboratory values occur:  ANC < 2 K  Pltc < 80K in sickle-cell patients; < 100K in other patients  Hgb <= 6 in sickle-cell patients; < 8 in other patients  Reticulocytes < 80K when Hgb < 9  Hydroxyurea has been held (discontinued from profile) per policy.   11/5 Hgb 5.4  Dolly Rias RPh 07/29/2016, 12:33 PM Pager [phone]

## 2016-07-29 NOTE — Progress Notes (Signed)
Dr Jonelle Sidle notified that patients blood would not be ready until Monday. No new orders given, will continue to monitor.

## 2016-07-29 NOTE — Progress Notes (Signed)
SICKLE CELL SERVICE PROGRESS NOTE  Dawn [name] [id number] DOB: 08-24-1981 DOA: 07/26/2016 PCP: Angelica Chessman, MD  Assessment/Plan: Active Problems:   Sickle cell anemia with crisis ([place])   Sickle cell anemia with pain ([place])  1. Hb SS with crisis: No change in therapy. Patient still in crisis. Continue PCA, Clinician Assisted Doses and Toradol at current doses. Mobilize patient today. 2. Anemia of Chronic Disease:Patient appears to be in active hemolytic crisis. Hb has dropped to 5.4. Will transfuse 1 unit PRBC. Continue hydroxyurea at 1500 mg dosing. Re-check labs in the morning. 3. Chronic pain: Continue MS Contin. 4. DVT Prophylaxis: Lovenox.  Code Status: Full Code Family Communication: N/A Disposition Plan: Not yet ready for discharge  [facility]  Pager [phone]. If 7PM-7AM, please contact night-coverage.  07/29/2016, 9:36 AM  LOS: 3 days   Interim History: Pt reports pain at intensity of 7/10 and localized to legs and back.   Consultants:  None  Procedures:  None  Antibiotics:  None    Objective: Vitals:   07/29/16 0145 07/29/16 0423 07/29/16 0652 07/29/16 0812  BP: 127/68  (!) 106/42   Pulse: (!) 109  (!) 104   Resp: 20 15 16 15   Temp:   99.2 F (37.3 C)   TempSrc:   Oral   SpO2:  99%  99%  Weight:   81.7 kg (180 lb 3.2 oz)   Height:       Weight change:   Intake/Output Summary (Last 24 hours) at 07/29/16 0936 Last data filed at 07/29/16 0919  Gross per 24 hour  Intake           1362.4 ml  Output                0 ml  Net           1362.4 ml    General: Alert, awake, oriented x3, in no acute distress.  HEENT: [place]/AT PEERL, EOMI, icterus. Neck: Trachea midline,  no masses, no thyromegal,y no JVD, no carotid bruit OROPHARYNX:  Moist, No exudate/ erythema/lesions.  Heart: Regular rate and rhythm, without murmurs, rubs, gallops, PMI non-displaced, no heaves or thrills on palpation.  Lungs: Clear to auscultation, no wheezing or rhonchi noted.  No increased vocal fremitus resonant to percussion  Abdomen: Soft, nontender, nondistended, positive bowel sounds, no masses no hepatosplenomegaly noted..  Neuro: No focal neurological deficits noted cranial nerves II through XII grossly intact. Strength at baseline in bilateral upper and lower extremities. Musculoskeletal: No warmth swelling or erythema around joints, no spinal tenderness noted. Psychiatric: Patient alert and oriented x3, good insight and cognition, good recent to remote recall.    Data Reviewed: Basic Metabolic Panel:  Recent Labs Lab 07/26/16 0938  NA 136  K 3.7  CL 108  CO2 23  GLUCOSE 90  BUN 6  CREATININE 0.53  CALCIUM 8.6*   Liver Function Tests:  Recent Labs Lab 07/26/16 0938  AST 104*  ALT 42  ALKPHOS 118  BILITOT 11.8*  PROT 7.8  ALBUMIN 3.0*   No results for input(s): LIPASE, AMYLASE in the last 168 hours. No results for input(s): AMMONIA in the last 168 hours. CBC:  Recent Labs Lab 07/26/16 0938 07/27/16 1225 07/29/16 0633  WBC 5.8 9.9 9.2  NEUTROABS 2.8 5.9 5.3  HGB 7.4* 6.7* 5.4*  HCT 20.8* 17.4* 14.2*  MCV 104.5* 103.6* 106.0*  PLT 173 142* 105*   Cardiac Enzymes: No results for input(s): CKTOTAL, CKMB, CKMBINDEX, TROPONINI in the last 168  hours. BNP (last 3 results) No results for input(s): BNP in the last 8760 hours.  ProBNP (last 3 results) No results for input(s): PROBNP in the last 8760 hours.  CBG: No results for input(s): GLUCAP in the last 168 hours.  No results found for this or any previous visit (from the past 240 hour(s)).   Studies: Ir US Guide Vasc Access Right  Result Date: 07/17/2016 CLINICAL DATA:  History of sickle cell anemia, poor IV access and prior port placements. The patient requires a new Port-A-Cath. EXAM: IMPLANTED PORT A CATH PLACEMENT WITH ULTRASOUND AND FLUOROSCOPIC GUIDANCE ANESTHESIA/SEDATION: 10.0 mg IV Versed; 200 mcg IV Fentanyl Total Moderate Sedation Time:  42 minutes The patient's  level of consciousness and physiologic status were continuously monitored during the procedure by Radiology nursing. Additional Medications: 2 g IV Ancef. As antibiotic prophylaxis, Ancef was ordered pre-procedure and administered intravenously within one hour of incision. FLUOROSCOPY TIME:  18 seconds.  4.0 mGy. PROCEDURE: The procedure, risks, benefits, and alternatives were explained to the patient. Questions regarding the procedure were encouraged and answered. The patient understands and consents to the procedure. A time-out was performed prior to initiating the procedure. Ultrasound was utilized to confirm patency of the right internal jugular vein. The right neck and chest were prepped with chlorhexidine in a sterile fashion, and a sterile drape was applied covering the operative field. Maximum barrier sterile technique with sterile gowns and gloves were used for the procedure. Local anesthesia was provided with 1% lidocaine. After creating a small venotomy incision, a 21 gauge needle was advanced into the right internal jugular vein under direct, real-time ultrasound guidance. Ultrasound image documentation was performed. After securing guidewire access, an 8 Fr dilator was placed. A J-wire was kinked to measure appropriate catheter length. A subcutaneous port pocket was then created along the upper chest wall utilizing sharp and blunt dissection. Portable cautery was utilized. The pocket was irrigated with sterile saline. A single lumen power injectable port was chosen for placement. The 8 Fr catheter was tunneled from the port pocket site to the venotomy incision. The port was placed in the pocket. External catheter was trimmed to appropriate length based on guidewire measurement. At the venotomy, an 8 Fr peel-away sheath was placed over a guidewire. The catheter was then placed through the sheath and the sheath removed. Final catheter positioning was confirmed and documented with a fluoroscopic spot  image. The port was accessed with a needle and aspirated and flushed with heparinized saline. The access needle was removed. The venotomy and port pocket incisions were closed with subcutaneous 3-0 Monocryl and subcuticular 4-0 Vicryl. Dermabond was applied to both incisions. COMPLICATIONS: COMPLICATIONS None FINDINGS: After catheter placement, the tip lies at the cavo-atrial junction. The catheter aspirates normally and is ready for immediate use. IMPRESSION: Placement of single lumen port a cath via right internal jugular vein. The catheter tip lies at the cavo-atrial junction. A power injectable port a cath was placed and is ready for immediate use. Electronically Signed   By: Aletta Edouard M.D.   On: 07/17/2016 16:36   Ir Fluoro Guide Port Insertion Right  Result Date: 07/17/2016 CLINICAL DATA:  History of sickle cell anemia, poor IV access and prior port placements. The patient requires a new Port-A-Cath. EXAM: IMPLANTED PORT A CATH PLACEMENT WITH ULTRASOUND AND FLUOROSCOPIC GUIDANCE ANESTHESIA/SEDATION: 10.0 mg IV Versed; 200 mcg IV Fentanyl Total Moderate Sedation Time:  42 minutes The patient's level of consciousness and physiologic status were continuously monitored during  the procedure by Radiology nursing. Additional Medications: 2 g IV Ancef. As antibiotic prophylaxis, Ancef was ordered pre-procedure and administered intravenously within one hour of incision. FLUOROSCOPY TIME:  18 seconds.  4.0 mGy. PROCEDURE: The procedure, risks, benefits, and alternatives were explained to the patient. Questions regarding the procedure were encouraged and answered. The patient understands and consents to the procedure. A time-out was performed prior to initiating the procedure. Ultrasound was utilized to confirm patency of the right internal jugular vein. The right neck and chest were prepped with chlorhexidine in a sterile fashion, and a sterile drape was applied covering the operative field. Maximum barrier  sterile technique with sterile gowns and gloves were used for the procedure. Local anesthesia was provided with 1% lidocaine. After creating a small venotomy incision, a 21 gauge needle was advanced into the right internal jugular vein under direct, real-time ultrasound guidance. Ultrasound image documentation was performed. After securing guidewire access, an 8 Fr dilator was placed. A J-wire was kinked to measure appropriate catheter length. A subcutaneous port pocket was then created along the upper chest wall utilizing sharp and blunt dissection. Portable cautery was utilized. The pocket was irrigated with sterile saline. A single lumen power injectable port was chosen for placement. The 8 Fr catheter was tunneled from the port pocket site to the venotomy incision. The port was placed in the pocket. External catheter was trimmed to appropriate length based on guidewire measurement. At the venotomy, an 8 Fr peel-away sheath was placed over a guidewire. The catheter was then placed through the sheath and the sheath removed. Final catheter positioning was confirmed and documented with a fluoroscopic spot image. The port was accessed with a needle and aspirated and flushed with heparinized saline. The access needle was removed. The venotomy and port pocket incisions were closed with subcutaneous 3-0 Monocryl and subcuticular 4-0 Vicryl. Dermabond was applied to both incisions. COMPLICATIONS: COMPLICATIONS None FINDINGS: After catheter placement, the tip lies at the cavo-atrial junction. The catheter aspirates normally and is ready for immediate use. IMPRESSION: Placement of single lumen port a cath via right internal jugular vein. The catheter tip lies at the cavo-atrial junction. A power injectable port a cath was placed and is ready for immediate use. Electronically Signed   By: Aletta Edouard M.D.   On: 07/17/2016 16:36    Scheduled Meds: . sodium chloride   Intravenous Once  . enoxaparin (LOVENOX)  injection  40 mg Subcutaneous Q24H  . folic acid  1 mg Oral Daily  . HYDROmorphone   Intravenous Q4H  .  HYDROmorphone (DILAUDID) injection  2 mg Intravenous Q2H  . hydroxyurea  1,500 mg Oral QODAY   And  . hydroxyurea  1,000 mg Oral QODAY  . ketorolac  30 mg Intravenous Q6H  . morphine  15 mg Oral Q12H  . senna-docusate  1 tablet Oral BID   Continuous Infusions: . dextrose 5 % and 0.45% NaCl 75 mL/hr at 07/28/16 2246    Active Problems:   Sickle cell anemia with crisis ([facility])   Sickle cell anemia with pain ([facility])    In excess of 25 minutes spent during this visit. Greater than 50% involved face to face contact with the patient for assessment, counseling and coordination of care.

## 2016-07-30 LAB — CBC WITH DIFFERENTIAL/PLATELET
BASOS ABS: 0 10*3/uL (ref 0.0–0.1)
BASOS PCT: 0 %
Eosinophils Absolute: 0.3 10*3/uL (ref 0.0–0.7)
Eosinophils Relative: 3 %
HEMATOCRIT: 14.2 % — AB (ref 36.0–46.0)
HEMOGLOBIN: 5.4 g/dL — AB (ref 12.0–15.0)
LYMPHS ABS: 2.7 10*3/uL (ref 0.7–4.0)
LYMPHS PCT: 29 %
MCH: 40.3 pg — AB (ref 26.0–34.0)
MCHC: 38 g/dL — AB (ref 30.0–36.0)
MCV: 106 fL — ABNORMAL HIGH (ref 78.0–100.0)
MONOS PCT: 10 %
Monocytes Absolute: 0.9 10*3/uL (ref 0.1–1.0)
NEUTROS ABS: 5.3 10*3/uL (ref 1.7–7.7)
Neutrophils Relative %: 58 %
Platelets: 105 10*3/uL — ABNORMAL LOW (ref 150–400)
RBC: 1.34 MIL/uL — ABNORMAL LOW (ref 3.87–5.11)
RDW: 21.5 % — AB (ref 11.5–15.5)
WBC: 9.2 10*3/uL (ref 4.0–10.5)

## 2016-07-30 MED ORDER — CIPROFLOXACIN IN D5W 200 MG/100ML IV SOLN
200.0000 mg | Freq: Once | INTRAVENOUS | Status: AC
Start: 2016-07-30 — End: 2016-07-30
  Administered 2016-07-30: 200 mg via INTRAVENOUS
  Filled 2016-07-30: qty 100

## 2016-07-30 MED ORDER — CIPROFLOXACIN HCL 250 MG PO TABS
250.0000 mg | ORAL_TABLET | Freq: Two times a day (BID) | ORAL | Status: DC
  Administered 2016-07-31 – 2016-08-01 (×3): 250 mg via ORAL
  Filled 2016-07-30 (×3): qty 1

## 2016-07-30 MED ORDER — CIPROFLOXACIN HCL 250 MG PO TABS: 250.0000 mg | ORAL_TABLET | Freq: Two times a day (BID) | ORAL | Status: DC

## 2016-07-30 NOTE — Progress Notes (Addendum)
SICKLE CELL SERVICE PROGRESS NOTE  Dawn [name] [id number] DOB: 1980-11-19 DOA: 07/26/2016 PCP: Angelica Chessman, MD  Assessment/Plan: Active Problems:   Sickle cell anemia with crisis ([place])   Sickle cell anemia with pain ([place])  1. Hb SS with crisis: Continue PCA, Clinician Assisted Doses and Toradol at current doses. Decrease IVF to 75 ml/hr.  2. Anemia of Chronic Disease: Hb stable.Pt had decrease of Hb to 5.4 g/dL however reticulocyte count still robust. Hydrea held by Pharmacy yesterday. Pt receiving 1 unit RBC. Will re-check labs tomorrow and make a decision about resuming Hydrea.  3. Psychosocial: Pt has a classic victim role syndrome where she interacts with the world from the perspective that everything bad that happens is her fault and she can never attribute any positives to herself. She would benefit from counseling as an outpatient.  4. Chronic pain: Continue MS Contin. 5. Dysuria: Pt c/o dysuria and pain in the b/l flank area. U/A from Friday showed positive nitrites. Will start presumptively on Ciprofloxacin and send urine for culture.  6. DVT Prophylaxis: Lovenox.  Code Status: Full Code Family Communication: N/A Disposition Plan: Not yet ready for discharge  [place].  Pager [phone]. If 7PM-7AM, please contact night-coverage.  07/30/2016, 5:35 PM  LOS: 4 days   Interim History: Pt reports pain at intensity of 810 and localized to legs and back. She is very teary today and stating that she sees herself as a burden to her parents and that her son deserves a better mother. She is neither suicidal or homicidal.   Consultants:  None  Procedures:  None  Antibiotics:  None    Objective: Vitals:   07/30/16 1332 07/30/16 1535 07/30/16 1536 07/30/16 1600  BP: 133/76     Pulse: 96     Resp: 15   16  Temp: 98.1 F (36.7 C)     TempSrc: Oral     SpO2: 99% 91% 93% 92%  Weight:      Height:       Weight change:   Intake/Output Summary (Last 24  hours) at 07/30/16 1735 Last data filed at 07/30/16 1639  Gross per 24 hour  Intake           4334.4 ml  Output              250 ml  Net           4084.4 ml    General: Alert, awake, oriented x3, in no acute distress.  HEENT: [place]/AT PEERL, EOMI, mild icterus at baseline. Neck: Trachea midline,  no masses, no thyromegal,y no JVD, no carotid bruit OROPHARYNX:  Moist, No exudate/ erythema/lesions.  Heart: Regular rate and rhythm, without murmurs, rubs, gallops, PMI non-displaced, no heaves or thrills on palpation.  Lungs: Clear to auscultation, no wheezing or rhonchi noted. No increased vocal fremitus resonant to percussion  Abdomen: Soft, nontender, nondistended, positive bowel sounds, no masses no hepatosplenomegaly noted.  Neuro: No focal neurological deficits noted cranial nerves II through XII grossly intact. Strength at baseline in bilateral upper and lower extremities. Musculoskeletal: No warmth swelling or erythema around joints, no spinal tenderness noted. (+) B/L CVA tenderness. Psychiatric: Patient alert and oriented x3, good insight and cognition, good recent to remote recall.    Data Reviewed: Basic Metabolic Panel:  Recent Labs Lab 07/26/16 0938  NA 136  K 3.7  CL 108  CO2 23  GLUCOSE 90  BUN 6  CREATININE 0.53  CALCIUM 8.6*   Liver Function  Tests:  Recent Labs Lab 07/26/16 0938  AST 104*  ALT 42  ALKPHOS 118  BILITOT 11.8*  PROT 7.8  ALBUMIN 3.0*   No results for input(s): LIPASE, AMYLASE in the last 168 hours. No results for input(s): AMMONIA in the last 168 hours. CBC:  Recent Labs Lab 07/26/16 0938 07/27/16 1225 07/29/16 0633  WBC 5.8 9.9 9.2  NEUTROABS 2.8 5.9 5.3  HGB 7.4* 6.7* 5.4*  HCT 20.8* 17.4* 14.2*  MCV 104.5* 103.6* 106.0*  PLT 173 142* 105*   Cardiac Enzymes: No results for input(s): CKTOTAL, CKMB, CKMBINDEX, TROPONINI in the last 168 hours. BNP (last 3 results) No results for input(s): BNP in the last 8760 hours.  ProBNP  (last 3 results) No results for input(s): PROBNP in the last 8760 hours.  CBG: No results for input(s): GLUCAP in the last 168 hours.  No results found for this or any previous visit (from the past 240 hour(s)).   Studies: Ir US Guide Vasc Access Right  Result Date: 07/17/2016 CLINICAL DATA:  History of sickle cell anemia, poor IV access and prior port placements. The patient requires a new Port-A-Cath. EXAM: IMPLANTED PORT A CATH PLACEMENT WITH ULTRASOUND AND FLUOROSCOPIC GUIDANCE ANESTHESIA/SEDATION: 10.0 mg IV Versed; 200 mcg IV Fentanyl Total Moderate Sedation Time:  42 minutes The patient's level of consciousness and physiologic status were continuously monitored during the procedure by Radiology nursing. Additional Medications: 2 g IV Ancef. As antibiotic prophylaxis, Ancef was ordered pre-procedure and administered intravenously within one hour of incision. FLUOROSCOPY TIME:  18 seconds.  4.0 mGy. PROCEDURE: The procedure, risks, benefits, and alternatives were explained to the patient. Questions regarding the procedure were encouraged and answered. The patient understands and consents to the procedure. A time-out was performed prior to initiating the procedure. Ultrasound was utilized to confirm patency of the right internal jugular vein. The right neck and chest were prepped with chlorhexidine in a sterile fashion, and a sterile drape was applied covering the operative field. Maximum barrier sterile technique with sterile gowns and gloves were used for the procedure. Local anesthesia was provided with 1% lidocaine. After creating a small venotomy incision, a 21 gauge needle was advanced into the right internal jugular vein under direct, real-time ultrasound guidance. Ultrasound image documentation was performed. After securing guidewire access, an 8 Fr dilator was placed. A J-wire was kinked to measure appropriate catheter length. A subcutaneous port pocket was then created along the upper chest  wall utilizing sharp and blunt dissection. Portable cautery was utilized. The pocket was irrigated with sterile saline. A single lumen power injectable port was chosen for placement. The 8 Fr catheter was tunneled from the port pocket site to the venotomy incision. The port was placed in the pocket. External catheter was trimmed to appropriate length based on guidewire measurement. At the venotomy, an 8 Fr peel-away sheath was placed over a guidewire. The catheter was then placed through the sheath and the sheath removed. Final catheter positioning was confirmed and documented with a fluoroscopic spot image. The port was accessed with a needle and aspirated and flushed with heparinized saline. The access needle was removed. The venotomy and port pocket incisions were closed with subcutaneous 3-0 Monocryl and subcuticular 4-0 Vicryl. Dermabond was applied to both incisions. COMPLICATIONS: COMPLICATIONS None FINDINGS: After catheter placement, the tip lies at the cavo-atrial junction. The catheter aspirates normally and is ready for immediate use. IMPRESSION: Placement of single lumen port a cath via right internal jugular vein. The catheter tip  lies at the cavo-atrial junction. A power injectable port a cath was placed and is ready for immediate use. Electronically Signed   By: Aletta Edouard M.D.   On: 07/17/2016 16:36   Ir Fluoro Guide Port Insertion Right  Result Date: 07/17/2016 CLINICAL DATA:  History of sickle cell anemia, poor IV access and prior port placements. The patient requires a new Port-A-Cath. EXAM: IMPLANTED PORT A CATH PLACEMENT WITH ULTRASOUND AND FLUOROSCOPIC GUIDANCE ANESTHESIA/SEDATION: 10.0 mg IV Versed; 200 mcg IV Fentanyl Total Moderate Sedation Time:  42 minutes The patient's level of consciousness and physiologic status were continuously monitored during the procedure by Radiology nursing. Additional Medications: 2 g IV Ancef. As antibiotic prophylaxis, Ancef was ordered pre-procedure  and administered intravenously within one hour of incision. FLUOROSCOPY TIME:  18 seconds.  4.0 mGy. PROCEDURE: The procedure, risks, benefits, and alternatives were explained to the patient. Questions regarding the procedure were encouraged and answered. The patient understands and consents to the procedure. A time-out was performed prior to initiating the procedure. Ultrasound was utilized to confirm patency of the right internal jugular vein. The right neck and chest were prepped with chlorhexidine in a sterile fashion, and a sterile drape was applied covering the operative field. Maximum barrier sterile technique with sterile gowns and gloves were used for the procedure. Local anesthesia was provided with 1% lidocaine. After creating a small venotomy incision, a 21 gauge needle was advanced into the right internal jugular vein under direct, real-time ultrasound guidance. Ultrasound image documentation was performed. After securing guidewire access, an 8 Fr dilator was placed. A J-wire was kinked to measure appropriate catheter length. A subcutaneous port pocket was then created along the upper chest wall utilizing sharp and blunt dissection. Portable cautery was utilized. The pocket was irrigated with sterile saline. A single lumen power injectable port was chosen for placement. The 8 Fr catheter was tunneled from the port pocket site to the venotomy incision. The port was placed in the pocket. External catheter was trimmed to appropriate length based on guidewire measurement. At the venotomy, an 8 Fr peel-away sheath was placed over a guidewire. The catheter was then placed through the sheath and the sheath removed. Final catheter positioning was confirmed and documented with a fluoroscopic spot image. The port was accessed with a needle and aspirated and flushed with heparinized saline. The access needle was removed. The venotomy and port pocket incisions were closed with subcutaneous 3-0 Monocryl and  subcuticular 4-0 Vicryl. Dermabond was applied to both incisions. COMPLICATIONS: COMPLICATIONS None FINDINGS: After catheter placement, the tip lies at the cavo-atrial junction. The catheter aspirates normally and is ready for immediate use. IMPRESSION: Placement of single lumen port a cath via right internal jugular vein. The catheter tip lies at the cavo-atrial junction. A power injectable port a cath was placed and is ready for immediate use. Electronically Signed   By: Aletta Edouard M.D.   On: 07/17/2016 16:36    Scheduled Meds: . [START ON 07/31/2016] ciprofloxacin  250 mg Oral BID  . enoxaparin (LOVENOX) injection  40 mg Subcutaneous Q24H  . folic acid  1 mg Oral Daily  . HYDROmorphone   Intravenous Q4H  .  HYDROmorphone (DILAUDID) injection  2 mg Intravenous Q2H  . ketorolac  30 mg Intravenous Q6H  . morphine  15 mg Oral Q12H  . senna-docusate  1 tablet Oral BID   Continuous Infusions: . dextrose 5 % and 0.45% NaCl 10 mL/hr at 07/30/16 1436    Active Problems:  Sickle cell anemia with crisis ([facility])   Sickle cell anemia with pain ([facility])    In excess of 25 minutes spent during this visit. Greater than 50% involved face to face contact with the patient for assessment, counseling and coordination of care.

## 2016-07-30 NOTE — Care Management Important Message (Signed)
Important Message  Patient Details  Name: Dawn [name] MRN: [id number] Date of Birth: 12-Jan-1981   Medicare Important Message Given:  Yes    Camillo Flaming 07/30/2016, 9:14 AMImportant Message  Patient Details  Name: Dawn [name] MRN: [id number] Date of Birth: 06/23/1981   Medicare Important Message Given:  Yes    Camillo Flaming 07/30/2016, 9:13 AM

## 2016-07-30 NOTE — Progress Notes (Signed)
Patient ambulated in the hallway. NT notified this nurse, at rest oxygen saturation was 91%, with ambulation 93%.

## 2016-07-30 NOTE — Progress Notes (Signed)
Pharmacy IV to PO conversion  The patient is receiving diphenhydramine by the intravenous route.  Based on the following criteria approved by the Pharmacy and [place], diphenhydramine is being converted to the equivalent oral dose form.   Not prescribed to treat or prevent a severe allergic reaction   Not prescribed as premedication prior to receiving blood product, biologic medication, antimicrobial, or chemotherapy agent   The patient has tolerated at least one dose of an oral or enteral medication   The patient has no evidence of active gastrointestinal bleeding or impaired GI absorption (gastrectomy, short bowel, patient on TNA or NPO).   The patient is not undergoing procedural sedation  If you have any questions about this conversion, please contact the Pharmacy Department (ext [phone]).  Thank you.  Reuel Boom, PharmD Pager: [phone] 07/30/2016, 10:20 AM

## 2016-07-30 NOTE — Progress Notes (Signed)
Called blood bank and asked if patient's blood is ready, per lab person, she will order it and will notify nurse if it's ready.

## 2016-07-31 LAB — CBC WITH DIFFERENTIAL/PLATELET
BAND NEUTROPHILS: 0 %
BASOS ABS: 0 10*3/uL (ref 0.0–0.1)
BLASTS: 0 %
Basophils Relative: 0 %
EOS ABS: 0.6 10*3/uL (ref 0.0–0.7)
Eosinophils Relative: 7 %
HEMATOCRIT: 15.8 % — AB (ref 36.0–46.0)
Hemoglobin: 6 g/dL — CL (ref 12.0–15.0)
Lymphocytes Relative: 32 %
Lymphs Abs: 2.8 10*3/uL (ref 0.7–4.0)
MCH: 37.5 pg — ABNORMAL HIGH (ref 26.0–34.0)
MCHC: 37.7 g/dL — ABNORMAL HIGH (ref 30.0–36.0)
MCV: 99.4 fL (ref 78.0–100.0)
METAMYELOCYTES PCT: 0 %
MONOS PCT: 9 %
Monocytes Absolute: 0.8 10*3/uL (ref 0.1–1.0)
Myelocytes: 0 %
NEUTROS ABS: 4.7 10*3/uL (ref 1.7–7.7)
Neutrophils Relative %: 52 %
Other: 0 %
PROMYELOCYTES ABS: 0 %
Platelets: 94 10*3/uL — ABNORMAL LOW (ref 150–400)
RBC: 1.59 MIL/uL — ABNORMAL LOW (ref 3.87–5.11)
RDW: 23.7 % — AB (ref 11.5–15.5)
WBC: 8.9 10*3/uL (ref 4.0–10.5)
nRBC: 28 /100 WBC — ABNORMAL HIGH

## 2016-07-31 LAB — TYPE AND SCREEN
ABO/RH(D): A POS
ANTIBODY SCREEN: POSITIVE
DAT, IGG: NEGATIVE
UNIT DIVISION: 0

## 2016-07-31 LAB — LACTATE DEHYDROGENASE, ISOENZYMES
LDH 1: 40 % — ABNORMAL HIGH (ref 17–32)
LDH 2: 36 % (ref 25–40)
LDH 3: 13 % — ABNORMAL LOW (ref 17–27)
LDH 4: 5 % (ref 5–13)
LDH 5: 6 % (ref 4–20)
LDH ISOENZYMES, TOTAL: 377 IU/L — AB (ref 119–226)

## 2016-07-31 LAB — RETICULOCYTES
RBC.: 1.59 MIL/uL — AB (ref 3.87–5.11)
RETIC COUNT ABSOLUTE: 302.1 10*3/uL — AB (ref 19.0–186.0)
Retic Ct Pct: 19 % — ABNORMAL HIGH (ref 0.4–3.1)

## 2016-07-31 LAB — URINE CULTURE: CULTURE: NO GROWTH

## 2016-07-31 MED ORDER — HYDROXYUREA 500 MG PO CAPS
1000.0000 mg | ORAL_CAPSULE | ORAL | Status: DC
  Administered 2016-07-31 – 2016-08-02 (×2): 1000 mg via ORAL
  Filled 2016-07-31 (×2): qty 2

## 2016-07-31 MED ORDER — HYDROXYUREA 500 MG PO CAPS
1500.0000 mg | ORAL_CAPSULE | ORAL | Status: DC
  Administered 2016-08-01: 1500 mg via ORAL
  Filled 2016-07-31: qty 3

## 2016-07-31 NOTE — Progress Notes (Signed)
SICKLE CELL SERVICE PROGRESS NOTE  KARIN OHLENDORF [id number] DOB: 08-08-1981 DOA: 07/26/2016 PCP: Angelica Chessman, MD  Assessment/Plan: Active Problems:   Sickle cell anemia with crisis ([place])   Sickle cell anemia with pain ([place])  1. Hb SS with crisis: Continue PCA, Clinician Assisted Doses and Toradol at current doses. Decrease IVF to [facility].  2. Anemia of Chronic Disease: Pt s/p transfusion of 1 unit RBC's. Reticulocytosis robust so will resume Hydrea.  3. Psychosocial:Pt continues to be teary. Will ask [place] to see in consult.  4. Chronic pain: Continue MS Contin. 5. Dysuria: Improved. Pt still c/o foul smelling urine. Continue Ciprofloxacin and await results of urine culture.  6. DVT Prophylaxis: Lovenox.  Code Status: Full Code Family Communication: N/A Disposition Plan: Not yet ready for discharge  [place].  Pager [phone]. If 7PM-7AM, please contact night-coverage.  07/31/2016, 4:16 PM  LOS: 5 days   Interim History: Pt reports pain at intensity of 710 and localized to legs and back. She continues to be  very teary today.   Consultants:  None  Procedures:  None  Antibiotics:  None    Objective: Vitals:   07/31/16 0847 07/31/16 0931 07/31/16 1224 07/31/16 1307  BP:  140/88  136/83  Pulse:  86  88  Resp: 15 12 11 15   Temp:  98.1 F (36.7 C)  98.3 F (36.8 C)  TempSrc:  Oral  Oral  SpO2: 97% 100% 98% 99%  Weight:      Height:       Weight change:   Intake/Output Summary (Last 24 hours) at 07/31/16 1616 Last data filed at 07/31/16 0934  Gross per 24 hour  Intake              700 ml  Output                0 ml  Net              700 ml    General: Alert, awake, oriented x3, in no acute distress.  HEENT: [place]/AT PEERL, EOMI, mild icterus at baseline. Neck: Trachea midline,  no masses, no thyromegal,y no JVD, no carotid bruit OROPHARYNX:  Moist, No exudate/ erythema/lesions.  Heart: Regular rate and rhythm, without murmurs, rubs,  gallops, PMI non-displaced, no heaves or thrills on palpation.  Lungs: Clear to auscultation, no wheezing or rhonchi noted. No increased vocal fremitus resonant to percussion  Abdomen: Soft, nontender, nondistended, positive bowel sounds, no masses no hepatosplenomegaly noted.  Neuro: No focal neurological deficits noted cranial nerves II through XII grossly intact. Strength at baseline in bilateral upper and lower extremities. Musculoskeletal: No warmth swelling or erythema around joints, no spinal tenderness noted.     Data Reviewed: Basic Metabolic Panel:  Recent Labs Lab 07/26/16 0938  NA 136  K 3.7  CL 108  CO2 23  GLUCOSE 90  BUN 6  CREATININE 0.53  CALCIUM 8.6*   Liver Function Tests:  Recent Labs Lab 07/26/16 0938  AST 104*  ALT 42  ALKPHOS 118  BILITOT 11.8*  PROT 7.8  ALBUMIN 3.0*   No results for input(s): LIPASE, AMYLASE in the last 168 hours. No results for input(s): AMMONIA in the last 168 hours. CBC:  Recent Labs Lab 07/26/16 0938 07/27/16 1225 07/29/16 0633 07/31/16 1040  WBC 5.8 9.9 9.2 8.9  NEUTROABS 2.8 5.9 5.3 4.7  HGB 7.4* 6.7* 5.4* 6.0*  HCT 20.8* 17.4* 14.2* 15.8*  MCV 104.5* 103.6* 106.0* 99.4  PLT  173 142* 105* 94*   Cardiac Enzymes: No results for input(s): CKTOTAL, CKMB, CKMBINDEX, TROPONINI in the last 168 hours. BNP (last 3 results) No results for input(s): BNP in the last 8760 hours.  ProBNP (last 3 results) No results for input(s): PROBNP in the last 8760 hours.  CBG: No results for input(s): GLUCAP in the last 168 hours.  Recent Results (from the past 240 hour(s))  Culture, Urine     Status: None   Collection Time: 07/30/16  3:40 PM  Result Value Ref Range Status   Specimen Description URINE, CLEAN CATCH  Final   Special Requests NONE  Final   Culture NO GROWTH Performed at [facility]   Final   Report Status 07/31/2016 FINAL  Final     Studies: Ir US Guide Vasc Access Right  Result Date:  07/17/2016 CLINICAL DATA:  History of sickle cell anemia, poor IV access and prior port placements. The patient requires a new Port-A-Cath. EXAM: IMPLANTED PORT A CATH PLACEMENT WITH ULTRASOUND AND FLUOROSCOPIC GUIDANCE ANESTHESIA/SEDATION: 10.0 mg IV Versed; 200 mcg IV Fentanyl Total Moderate Sedation Time:  42 minutes The patient's level of consciousness and physiologic status were continuously monitored during the procedure by Radiology nursing. Additional Medications: 2 g IV Ancef. As antibiotic prophylaxis, Ancef was ordered pre-procedure and administered intravenously within one hour of incision. FLUOROSCOPY TIME:  18 seconds.  4.0 mGy. PROCEDURE: The procedure, risks, benefits, and alternatives were explained to the patient. Questions regarding the procedure were encouraged and answered. The patient understands and consents to the procedure. A time-out was performed prior to initiating the procedure. Ultrasound was utilized to confirm patency of the right internal jugular vein. The right neck and chest were prepped with chlorhexidine in a sterile fashion, and a sterile drape was applied covering the operative field. Maximum barrier sterile technique with sterile gowns and gloves were used for the procedure. Local anesthesia was provided with 1% lidocaine. After creating a small venotomy incision, a 21 gauge needle was advanced into the right internal jugular vein under direct, real-time ultrasound guidance. Ultrasound image documentation was performed. After securing guidewire access, an 8 Fr dilator was placed. A J-wire was kinked to measure appropriate catheter length. A subcutaneous port pocket was then created along the upper chest wall utilizing sharp and blunt dissection. Portable cautery was utilized. The pocket was irrigated with sterile saline. A single lumen power injectable port was chosen for placement. The 8 Fr catheter was tunneled from the port pocket site to the venotomy incision. The port  was placed in the pocket. External catheter was trimmed to appropriate length based on guidewire measurement. At the venotomy, an 8 Fr peel-away sheath was placed over a guidewire. The catheter was then placed through the sheath and the sheath removed. Final catheter positioning was confirmed and documented with a fluoroscopic spot image. The port was accessed with a needle and aspirated and flushed with heparinized saline. The access needle was removed. The venotomy and port pocket incisions were closed with subcutaneous 3-0 Monocryl and subcuticular 4-0 Vicryl. Dermabond was applied to both incisions. COMPLICATIONS: COMPLICATIONS None FINDINGS: After catheter placement, the tip lies at the cavo-atrial junction. The catheter aspirates normally and is ready for immediate use. IMPRESSION: Placement of single lumen port a cath via right internal jugular vein. The catheter tip lies at the cavo-atrial junction. A power injectable port a cath was placed and is ready for immediate use. Electronically Signed   By: Jenness Corner.D.  On: 07/17/2016 16:36   Ir Fluoro Guide Port Insertion Right  Result Date: 07/17/2016 CLINICAL DATA:  History of sickle cell anemia, poor IV access and prior port placements. The patient requires a new Port-A-Cath. EXAM: IMPLANTED PORT A CATH PLACEMENT WITH ULTRASOUND AND FLUOROSCOPIC GUIDANCE ANESTHESIA/SEDATION: 10.0 mg IV Versed; 200 mcg IV Fentanyl Total Moderate Sedation Time:  42 minutes The patient's level of consciousness and physiologic status were continuously monitored during the procedure by Radiology nursing. Additional Medications: 2 g IV Ancef. As antibiotic prophylaxis, Ancef was ordered pre-procedure and administered intravenously within one hour of incision. FLUOROSCOPY TIME:  18 seconds.  4.0 mGy. PROCEDURE: The procedure, risks, benefits, and alternatives were explained to the patient. Questions regarding the procedure were encouraged and answered. The patient  understands and consents to the procedure. A time-out was performed prior to initiating the procedure. Ultrasound was utilized to confirm patency of the right internal jugular vein. The right neck and chest were prepped with chlorhexidine in a sterile fashion, and a sterile drape was applied covering the operative field. Maximum barrier sterile technique with sterile gowns and gloves were used for the procedure. Local anesthesia was provided with 1% lidocaine. After creating a small venotomy incision, a 21 gauge needle was advanced into the right internal jugular vein under direct, real-time ultrasound guidance. Ultrasound image documentation was performed. After securing guidewire access, an 8 Fr dilator was placed. A J-wire was kinked to measure appropriate catheter length. A subcutaneous port pocket was then created along the upper chest wall utilizing sharp and blunt dissection. Portable cautery was utilized. The pocket was irrigated with sterile saline. A single lumen power injectable port was chosen for placement. The 8 Fr catheter was tunneled from the port pocket site to the venotomy incision. The port was placed in the pocket. External catheter was trimmed to appropriate length based on guidewire measurement. At the venotomy, an 8 Fr peel-away sheath was placed over a guidewire. The catheter was then placed through the sheath and the sheath removed. Final catheter positioning was confirmed and documented with a fluoroscopic spot image. The port was accessed with a needle and aspirated and flushed with heparinized saline. The access needle was removed. The venotomy and port pocket incisions were closed with subcutaneous 3-0 Monocryl and subcuticular 4-0 Vicryl. Dermabond was applied to both incisions. COMPLICATIONS: COMPLICATIONS None FINDINGS: After catheter placement, the tip lies at the cavo-atrial junction. The catheter aspirates normally and is ready for immediate use. IMPRESSION: Placement of single  lumen port a cath via right internal jugular vein. The catheter tip lies at the cavo-atrial junction. A power injectable port a cath was placed and is ready for immediate use. Electronically Signed   By: Aletta Edouard M.D.   On: 07/17/2016 16:36    Scheduled Meds: . ciprofloxacin  250 mg Oral BID  . enoxaparin (LOVENOX) injection  40 mg Subcutaneous Q24H  . folic acid  1 mg Oral Daily  . HYDROmorphone   Intravenous Q4H  .  HYDROmorphone (DILAUDID) injection  2 mg Intravenous Q2H  . hydroxyurea  1,000 mg Oral QODAY   And  . [START ON 08/02/2016] hydroxyurea  1,500 mg Oral QODAY  . morphine  15 mg Oral Q12H  . senna-docusate  1 tablet Oral BID   Continuous Infusions: . dextrose 5 % and 0.45% NaCl 10 mL/hr at 07/30/16 1436    Active Problems:   Sickle cell anemia with crisis (HCC)   Sickle cell anemia with pain ([place])  In excess of 25 minutes spent during this visit. Greater than 50% involved face to face contact with the patient for assessment, counseling and coordination of care.

## 2016-08-01 DIAGNOSIS — F329 Major depressive disorder, single episode, unspecified: Secondary | ICD-10-CM

## 2016-08-01 DIAGNOSIS — D696 Thrombocytopenia, unspecified: Secondary | ICD-10-CM

## 2016-08-01 MED ORDER — OXYCODONE HCL 5 MG PO TABS
15.0000 mg | ORAL_TABLET | ORAL | Status: DC
  Administered 2016-08-01 – 2016-08-04 (×18): 15 mg via ORAL
  Filled 2016-08-01 (×18): qty 3

## 2016-08-01 MED ORDER — HYDROMORPHONE 1 MG/ML IV SOLN
INTRAVENOUS | Status: DC
  Administered 2016-08-01: 2.8 mg via INTRAVENOUS
  Administered 2016-08-01 (×2): 2 mg via INTRAVENOUS
  Administered 2016-08-02: 4.8 mg via INTRAVENOUS
  Administered 2016-08-02: 2 mg via INTRAVENOUS
  Administered 2016-08-02: 25 mg via INTRAVENOUS
  Administered 2016-08-02: 5.5 mg via INTRAVENOUS
  Administered 2016-08-02: 6.2 mg via INTRAVENOUS
  Administered 2016-08-02: 2 mg via INTRAVENOUS
  Administered 2016-08-02: 5.4 mg via INTRAVENOUS
  Administered 2016-08-02: 5.5 mg via INTRAVENOUS
  Administered 2016-08-02: 2 mg via INTRAVENOUS
  Administered 2016-08-02: 3.4 mg via INTRAVENOUS
  Administered 2016-08-02 – 2016-08-03 (×5): 2 mg via INTRAVENOUS
  Administered 2016-08-03: 3.4 mg via INTRAVENOUS
  Administered 2016-08-03: 5.5 mg via INTRAVENOUS
  Administered 2016-08-03: 2 mg via INTRAVENOUS
  Administered 2016-08-03: 3.5 mg via INTRAVENOUS
  Administered 2016-08-03: 2 mg via INTRAVENOUS
  Administered 2016-08-03: 04:00:00 via INTRAVENOUS
  Administered 2016-08-03: 5.4 mg via INTRAVENOUS
  Administered 2016-08-03: 3.5 mg via INTRAVENOUS
  Administered 2016-08-03: 4.1 mg via INTRAVENOUS
  Administered 2016-08-03: 2 mg via INTRAVENOUS
  Administered 2016-08-03: 3.4 mg via INTRAVENOUS
  Administered 2016-08-03: 2 mg via INTRAVENOUS
  Administered 2016-08-03: via INTRAVENOUS
  Administered 2016-08-04: 4.2 mg via INTRAVENOUS
  Administered 2016-08-04: 2 mg via INTRAVENOUS
  Administered 2016-08-04: 3.4 mg via INTRAVENOUS
  Administered 2016-08-04: 4.2 mg via INTRAVENOUS
  Filled 2016-08-01 (×3): qty 25

## 2016-08-01 NOTE — Progress Notes (Signed)
SICKLE CELL SERVICE PROGRESS NOTE  Dawn [name] [id number] DOB: [birth year] DOA: 07/26/2016 PCP: Angelica Chessman, MD  Assessment/Plan: Active Problems:   Sickle cell anemia with crisis ([place])   Sickle cell anemia with pain ([place])  1. Hb SS with crisis: Continue PCA, schedule Oxycodone and discontinue Clinician Assisted Doses. Toradol completed. Decrease IVF to [facility].  2. Anemia of Chronic Disease: Pt s/p transfusion of 1 unit RBC's. Reticulocytosis robust so will resume Hydrea.  3. Psychosocial:Pt continues to be teary. Will ask [facility] services to see in consult.  4. Chronic pain: Continue MS Contin. 5. Dysuria: Improved. Urine culture negative. Will discontinue Ciprofloxacin. 6. Thrombocytopenia: Discontinue Lovenox. If platelets continue to decrease, will decrease Hydrea to 1000 mgm daily.  7. DVT Prophylaxis: Lovenox.  Code Status: Full Code Family Communication: N/A Disposition Plan: Not yet ready for discharge  [place].  Pager [phone]. If 7PM-7AM, please contact night-coverage.  08/01/2016, 5:26 PM  LOS: 6 days   Interim History: Pt reports pain at intensity of 710 and localized to legs and back. She continues to be  very teary today.   Consultants:  None  Procedures:  None  Antibiotics:  None    Objective: Vitals:   08/01/16 0936 08/01/16 1303 08/01/16 1407 08/01/16 1611  BP: 123/78  124/71   Pulse: 92  93   Resp: 11 15 17 17   Temp: 99.3 F (37.4 C)  99.4 F (37.4 C)   TempSrc: Oral  Oral   SpO2: 95% 97% 96% 91%  Weight:      Height:       Weight change:   Intake/Output Summary (Last 24 hours) at 08/01/16 1726 Last data filed at 08/01/16 1600  Gross per 24 hour  Intake          1786.77 ml  Output                0 ml  Net          1786.77 ml    General: Alert, awake, oriented x3, in no acute distress.  HEENT: [place]/AT PEERL, EOMI, mild icterus at baseline.  Heart: Regular rate and rhythm, without murmurs, rubs, gallops, PMI  non-displaced, no heaves or thrills on palpation.  Lungs: Clear to auscultation, no wheezing or rhonchi noted. No increased vocal fremitus resonant to percussion  Abdomen: Soft, nontender, nondistended, positive bowel sounds, no masses no hepatosplenomegaly noted.  Neuro: No focal neurological deficits noted cranial nerves II through XII grossly intact. Strength at baseline in bilateral upper and lower extremities. Musculoskeletal: No warmth swelling or erythema around joints, no spinal tenderness noted.     Data Reviewed: Basic Metabolic Panel:  Recent Labs Lab 07/26/16 0938  NA 136  K 3.7  CL 108  CO2 23  GLUCOSE 90  BUN 6  CREATININE 0.53  CALCIUM 8.6*   Liver Function Tests:  Recent Labs Lab 07/26/16 0938  AST 104*  ALT 42  ALKPHOS 118  BILITOT 11.8*  PROT 7.8  ALBUMIN 3.0*   No results for input(s): LIPASE, AMYLASE in the last 168 hours. No results for input(s): AMMONIA in the last 168 hours. CBC:  Recent Labs Lab 07/26/16 0938 07/27/16 1225 07/29/16 0633 07/31/16 1040  WBC 5.8 9.9 9.2 8.9  NEUTROABS 2.8 5.9 5.3 4.7  HGB 7.4* 6.7* 5.4* 6.0*  HCT 20.8* 17.4* 14.2* 15.8*  MCV 104.5* 103.6* 106.0* 99.4  PLT 173 142* 105* 94*   Cardiac Enzymes: No results for input(s): CKTOTAL, CKMB, CKMBINDEX, TROPONINI in the  last 168 hours. BNP (last 3 results) No results for input(s): BNP in the last 8760 hours.  ProBNP (last 3 results) No results for input(s): PROBNP in the last 8760 hours.  CBG: No results for input(s): GLUCAP in the last 168 hours.  Recent Results (from the past 240 hour(s))  Culture, Urine     Status: None   Collection Time: 07/30/16  3:40 PM  Result Value Ref Range Status   Specimen Description URINE, CLEAN CATCH  Final   Special Requests NONE  Final   Culture NO GROWTH Performed at [facility]   Final   Report Status 07/31/2016 FINAL  Final     Studies: Ir US Guide Vasc Access Right  Result Date: 07/17/2016 CLINICAL  DATA:  History of sickle cell anemia, poor IV access and prior port placements. The patient requires a new Port-A-Cath. EXAM: IMPLANTED PORT A CATH PLACEMENT WITH ULTRASOUND AND FLUOROSCOPIC GUIDANCE ANESTHESIA/SEDATION: 10.0 mg IV Versed; 200 mcg IV Fentanyl Total Moderate Sedation Time:  42 minutes The patient's level of consciousness and physiologic status were continuously monitored during the procedure by Radiology nursing. Additional Medications: 2 g IV Ancef. As antibiotic prophylaxis, Ancef was ordered pre-procedure and administered intravenously within one hour of incision. FLUOROSCOPY TIME:  18 seconds.  4.0 mGy. PROCEDURE: The procedure, risks, benefits, and alternatives were explained to the patient. Questions regarding the procedure were encouraged and answered. The patient understands and consents to the procedure. A time-out was performed prior to initiating the procedure. Ultrasound was utilized to confirm patency of the right internal jugular vein. The right neck and chest were prepped with chlorhexidine in a sterile fashion, and a sterile drape was applied covering the operative field. Maximum barrier sterile technique with sterile gowns and gloves were used for the procedure. Local anesthesia was provided with 1% lidocaine. After creating a small venotomy incision, a 21 gauge needle was advanced into the right internal jugular vein under direct, real-time ultrasound guidance. Ultrasound image documentation was performed. After securing guidewire access, an 8 Fr dilator was placed. A J-wire was kinked to measure appropriate catheter length. A subcutaneous port pocket was then created along the upper chest wall utilizing sharp and blunt dissection. Portable cautery was utilized. The pocket was irrigated with sterile saline. A single lumen power injectable port was chosen for placement. The 8 Fr catheter was tunneled from the port pocket site to the venotomy incision. The port was placed in the  pocket. External catheter was trimmed to appropriate length based on guidewire measurement. At the venotomy, an 8 Fr peel-away sheath was placed over a guidewire. The catheter was then placed through the sheath and the sheath removed. Final catheter positioning was confirmed and documented with a fluoroscopic spot image. The port was accessed with a needle and aspirated and flushed with heparinized saline. The access needle was removed. The venotomy and port pocket incisions were closed with subcutaneous 3-0 Monocryl and subcuticular 4-0 Vicryl. Dermabond was applied to both incisions. COMPLICATIONS: COMPLICATIONS None FINDINGS: After catheter placement, the tip lies at the cavo-atrial junction. The catheter aspirates normally and is ready for immediate use. IMPRESSION: Placement of single lumen port a cath via right internal jugular vein. The catheter tip lies at the cavo-atrial junction. A power injectable port a cath was placed and is ready for immediate use. Electronically Signed   By: Aletta Edouard M.D.   On: 07/17/2016 16:36   Ir Fluoro Guide Port Insertion Right  Result Date: 07/17/2016 CLINICAL DATA:  History of sickle cell anemia, poor IV access and prior port placements. The patient requires a new Port-A-Cath. EXAM: IMPLANTED PORT A CATH PLACEMENT WITH ULTRASOUND AND FLUOROSCOPIC GUIDANCE ANESTHESIA/SEDATION: 10.0 mg IV Versed; 200 mcg IV Fentanyl Total Moderate Sedation Time:  42 minutes The patient's level of consciousness and physiologic status were continuously monitored during the procedure by Radiology nursing. Additional Medications: 2 g IV Ancef. As antibiotic prophylaxis, Ancef was ordered pre-procedure and administered intravenously within one hour of incision. FLUOROSCOPY TIME:  18 seconds.  4.0 mGy. PROCEDURE: The procedure, risks, benefits, and alternatives were explained to the patient. Questions regarding the procedure were encouraged and answered. The patient understands and consents  to the procedure. A time-out was performed prior to initiating the procedure. Ultrasound was utilized to confirm patency of the right internal jugular vein. The right neck and chest were prepped with chlorhexidine in a sterile fashion, and a sterile drape was applied covering the operative field. Maximum barrier sterile technique with sterile gowns and gloves were used for the procedure. Local anesthesia was provided with 1% lidocaine. After creating a small venotomy incision, a 21 gauge needle was advanced into the right internal jugular vein under direct, real-time ultrasound guidance. Ultrasound image documentation was performed. After securing guidewire access, an 8 Fr dilator was placed. A J-wire was kinked to measure appropriate catheter length. A subcutaneous port pocket was then created along the upper chest wall utilizing sharp and blunt dissection. Portable cautery was utilized. The pocket was irrigated with sterile saline. A single lumen power injectable port was chosen for placement. The 8 Fr catheter was tunneled from the port pocket site to the venotomy incision. The port was placed in the pocket. External catheter was trimmed to appropriate length based on guidewire measurement. At the venotomy, an 8 Fr peel-away sheath was placed over a guidewire. The catheter was then placed through the sheath and the sheath removed. Final catheter positioning was confirmed and documented with a fluoroscopic spot image. The port was accessed with a needle and aspirated and flushed with heparinized saline. The access needle was removed. The venotomy and port pocket incisions were closed with subcutaneous 3-0 Monocryl and subcuticular 4-0 Vicryl. Dermabond was applied to both incisions. COMPLICATIONS: COMPLICATIONS None FINDINGS: After catheter placement, the tip lies at the cavo-atrial junction. The catheter aspirates normally and is ready for immediate use. IMPRESSION: Placement of single lumen port a cath via right  internal jugular vein. The catheter tip lies at the cavo-atrial junction. A power injectable port a cath was placed and is ready for immediate use. Electronically Signed   By: Aletta Edouard M.D.   On: 07/17/2016 16:36    Scheduled Meds: . folic acid  1 mg Oral Daily  . HYDROmorphone   Intravenous Q4H  . hydroxyurea  1,000 mg Oral QODAY   And  . hydroxyurea  1,500 mg Oral QODAY  . morphine  15 mg Oral Q12H  . oxyCODONE  15 mg Oral Q4H  . senna-docusate  1 tablet Oral BID   Continuous Infusions: . dextrose 5 % and 0.45% NaCl 10 mL/hr at 07/30/16 1436    Active Problems:   Sickle cell anemia with crisis ([facility])   Sickle cell anemia with pain ([facility])    In excess of 25 minutes spent during this visit. Greater than 50% involved face to face contact with the patient for assessment, counseling and coordination of care.

## 2016-08-02 DIAGNOSIS — F3289 Other specified depressive episodes: Secondary | ICD-10-CM

## 2016-08-02 LAB — RETICULOCYTES
RBC.: 1.61 MIL/uL — AB (ref 3.87–5.11)
RETIC COUNT ABSOLUTE: 302.7 10*3/uL — AB (ref 19.0–186.0)
Retic Ct Pct: 18.8 % — ABNORMAL HIGH (ref 0.4–3.1)

## 2016-08-02 LAB — CBC WITH DIFFERENTIAL/PLATELET
BASOS ABS: 0.1 10*3/uL (ref 0.0–0.1)
Basophils Relative: 1 %
EOS PCT: 3 %
Eosinophils Absolute: 0.2 10*3/uL (ref 0.0–0.7)
HEMATOCRIT: 16.5 % — AB (ref 36.0–46.0)
HEMOGLOBIN: 6.1 g/dL — AB (ref 12.0–15.0)
LYMPHS ABS: 2.2 10*3/uL (ref 0.7–4.0)
LYMPHS PCT: 39 %
MCH: 37.9 pg — ABNORMAL HIGH (ref 26.0–34.0)
MCHC: 37 g/dL — AB (ref 30.0–36.0)
MCV: 102.5 fL — AB (ref 78.0–100.0)
MONOS PCT: 16 %
Monocytes Absolute: 0.9 10*3/uL (ref 0.1–1.0)
NEUTROS ABS: 2.2 10*3/uL (ref 1.7–7.7)
Neutrophils Relative %: 41 %
Platelets: 86 10*3/uL — ABNORMAL LOW (ref 150–400)
RBC: 1.61 MIL/uL — ABNORMAL LOW (ref 3.87–5.11)
RDW: 24.2 % — AB (ref 11.5–15.5)
WBC: 5.6 10*3/uL (ref 4.0–10.5)

## 2016-08-02 LAB — HEMOGLOBINOPATHY EVALUATION
HGB A2 QUANT: 4.2 % — AB (ref 0.7–3.1)
HGB F QUANT: 8.3 % — AB (ref 0.0–2.0)
HGB S QUANTITAION: 68.3 % — AB
Hgb A: 19.2 % — ABNORMAL LOW (ref 94.0–98.0)
Hgb C: 0 %

## 2016-08-02 MED ORDER — SODIUM CHLORIDE 0.9% FLUSH: 10.0000 mL | INTRAVENOUS | Status: DC | PRN

## 2016-08-02 MED ORDER — HYDROXYUREA 500 MG PO CAPS
1000.0000 mg | ORAL_CAPSULE | Freq: Every day | ORAL | Status: DC
  Administered 2016-08-03 – 2016-08-04 (×2): 1000 mg via ORAL
  Filled 2016-08-02 (×2): qty 2

## 2016-08-02 NOTE — Progress Notes (Signed)
   08/02/16 1400  Clinical Encounter Type  Visited With Patient  Visit Type Initial;Psychological support  Counseling intern visited briefly with patient due to length of stay. Patient appeared alert and oriented with subdued mood and engaged politely with counselor. Patient indicated that she was doing OK except for being away from home and away from her son, though she stated he has visited her. Counselor explained her role and connection with the chaplains as a source of support, and patient responded that her father is a Theme park manager. Patient indicated she did not have any needs at this time but was open to counselor checking back in with her later in the week.  Lamount Cohen, Counseling Intern Department for [facility] and [facility] [address] [place], [place]

## 2016-08-03 ENCOUNTER — Inpatient Hospital Stay (HOSPITAL_COMMUNITY): Payer: Medicare Other

## 2016-08-03 DIAGNOSIS — R531 Weakness: Secondary | ICD-10-CM

## 2016-08-03 LAB — CBC WITH DIFFERENTIAL/PLATELET
BASOS PCT: 1 %
Basophils Absolute: 0.1 10*3/uL (ref 0.0–0.1)
EOS PCT: 4 %
Eosinophils Absolute: 0.2 10*3/uL (ref 0.0–0.7)
HEMATOCRIT: 16 % — AB (ref 36.0–46.0)
Hemoglobin: 6 g/dL — CL (ref 12.0–15.0)
Lymphocytes Relative: 33 %
Lymphs Abs: 2 10*3/uL (ref 0.7–4.0)
MCH: 38.5 pg — AB (ref 26.0–34.0)
MCHC: 37.5 g/dL — ABNORMAL HIGH (ref 30.0–36.0)
MCV: 102.6 fL — AB (ref 78.0–100.0)
MONO ABS: 1.2 10*3/uL — AB (ref 0.1–1.0)
Monocytes Relative: 19 %
NEUTROS ABS: 2.7 10*3/uL (ref 1.7–7.7)
NEUTROS PCT: 43 %
Platelets: 80 10*3/uL — ABNORMAL LOW (ref 150–400)
RBC: 1.56 MIL/uL — ABNORMAL LOW (ref 3.87–5.11)
RDW: 24.5 % — AB (ref 11.5–15.5)
WBC: 6.2 10*3/uL (ref 4.0–10.5)

## 2016-08-03 LAB — RETICULOCYTES
RBC.: 1.56 MIL/uL — ABNORMAL LOW (ref 3.87–5.11)
RETIC COUNT ABSOLUTE: 310.4 10*3/uL — AB (ref 19.0–186.0)
Retic Ct Pct: 19.9 % — ABNORMAL HIGH (ref 0.4–3.1)

## 2016-08-03 LAB — PREPARE RBC (CROSSMATCH)

## 2016-08-03 MED ORDER — IBUPROFEN 800 MG PO TABS
800.0000 mg | ORAL_TABLET | Freq: Three times a day (TID) | ORAL | Status: DC
  Administered 2016-08-03 – 2016-08-04 (×3): 800 mg via ORAL
  Filled 2016-08-03 (×3): qty 1

## 2016-08-03 MED ORDER — SODIUM CHLORIDE 0.9 % IV SOLN: Freq: Once | INTRAVENOUS | Status: DC

## 2016-08-03 NOTE — Progress Notes (Addendum)
SICKLE CELL SERVICE PROGRESS NOTE  Dawn [name] [id number] DOB: Jun 06, 1981 DOA: 07/26/2016 PCP: Angelica Chessman, MD  Assessment/Plan: Active Problems:   Sickle cell anemia with crisis ([place])   Sickle cell anemia with pain ([place])  1. Generalized Weakness: Pt able too ambulate without difficulty but c/o feeling weak during ambulation.  2. Hypoxia:No tachypnea noted with ambulation but she did have a decrease in saturation with ambulation on RA. I will transfuse 1 unit RBC.  3. Hb SS with crisis: Continue scheduled Oxycodone,and PCA for PRN use.  Toradol completed. Decrease IVF to [facility].  4. Anemia of Chronic Disease: Pt s/p transfusion of 1 unit RBC's. Reticulocytosis robust so will resume Hydrea.  5. Psychosocial:Pt continues to be teary. Refused [employer]. 6. Chronic pain: Continue MS Contin. 7. Dysuria: Improved. Urine culture negative. Will discontinue Ciprofloxacin. 8. Thrombocytopenia: Discontinue Lovenox. If platelets continue to decrease, will decrease Hydrea to 1000 mgm daily.  9. DVT Prophylaxis: Lovenox.  Code Status: Full Code Family Communication: N/A Disposition Plan: Anticipate discharge home tomorrow.  Kyndel Egger A.  Pager [phone]. If 7PM-7AM, please contact night-coverage.  08/03/2016, 11:58 AM  LOS: 8 days   Interim History: Pt reports that she was feeling weak today which is a change since yesterday. Pt also noted that she felt SOB with ambulation.   Consultants:  None  Procedures:  None  Antibiotics:  None    Objective: Vitals:   08/03/16 0552 08/03/16 0627 08/03/16 0757 08/03/16 0925  BP: 138/70   127/74  Pulse: 96   100  Resp: 13 14 16 13   Temp: 98.3 F (36.8 C)   99.3 F (37.4 C)  TempSrc: Oral   Oral  SpO2: 97% 97% 96% 95%  Weight:      Height:       Weight change:   Intake/Output Summary (Last 24 hours) at 08/03/16 1158 Last data filed at 08/03/16 0958  Gross per 24 hour  Intake            700.1 ml  Output                 0 ml  Net            700.1 ml    General: Alert, awake, oriented x3, in no acute distress.  HEENT: [place]/AT PEERL, EOMI, mild icterus at baseline.  Heart: Regular rate and rhythm, without murmurs, rubs, gallops, PMI non-displaced, no heaves or thrills on palpation.  Lungs: Clear to auscultation, no wheezing or rhonchi noted. No increased vocal fremitus resonant to percussion  Abdomen: Soft, nontender, nondistended, positive bowel sounds, no masses no hepatosplenomegaly noted.  Neuro: No focal neurological deficits noted cranial nerves II through XII grossly intact. Strength at baseline in bilateral upper and lower extremities. Musculoskeletal: No warmth swelling or erythema around joints, no spinal tenderness noted.     Data Reviewed: Basic Metabolic Panel: No results for input(s): NA, K, CL, CO2, GLUCOSE, BUN, CREATININE, CALCIUM, MG, PHOS in the last 168 hours. Liver Function Tests: No results for input(s): AST, ALT, ALKPHOS, BILITOT, PROT, ALBUMIN in the last 168 hours. No results for input(s): LIPASE, AMYLASE in the last 168 hours. No results for input(s): AMMONIA in the last 168 hours. CBC:  Recent Labs Lab 07/29/16 0633 07/31/16 1040 08/02/16 0715 08/03/16 0950  WBC 9.2 8.9 5.6 6.2  NEUTROABS 5.3 4.7 2.2 2.7  HGB 5.4* 6.0* 6.1* 6.0*  HCT 14.2* 15.8* 16.5* 16.0*  MCV 106.0* 99.4 102.5* 102.6*  PLT 105* 94* 86*  80*   Cardiac Enzymes: No results for input(s): CKTOTAL, CKMB, CKMBINDEX, TROPONINI in the last 168 hours. BNP (last 3 results) No results for input(s): BNP in the last 8760 hours.  ProBNP (last 3 results) No results for input(s): PROBNP in the last 8760 hours.  CBG: No results for input(s): GLUCAP in the last 168 hours.  Recent Results (from the past 240 hour(s))  Culture, Urine     Status: None   Collection Time: 07/30/16  3:40 PM  Result Value Ref Range Status   Specimen Description URINE, CLEAN CATCH  Final   Special Requests NONE  Final    Culture NO GROWTH Performed at [facility]   Final   Report Status 07/31/2016 FINAL  Final     Studies: Ir US Guide Vasc Access Right  Result Date: 07/17/2016 CLINICAL DATA:  History of sickle cell anemia, poor IV access and prior port placements. The patient requires a new Port-A-Cath. EXAM: IMPLANTED PORT A CATH PLACEMENT WITH ULTRASOUND AND FLUOROSCOPIC GUIDANCE ANESTHESIA/SEDATION: 10.0 mg IV Versed; 200 mcg IV Fentanyl Total Moderate Sedation Time:  42 minutes The patient's level of consciousness and physiologic status were continuously monitored during the procedure by Radiology nursing. Additional Medications: 2 g IV Ancef. As antibiotic prophylaxis, Ancef was ordered pre-procedure and administered intravenously within one hour of incision. FLUOROSCOPY TIME:  18 seconds.  4.0 mGy. PROCEDURE: The procedure, risks, benefits, and alternatives were explained to the patient. Questions regarding the procedure were encouraged and answered. The patient understands and consents to the procedure. A time-out was performed prior to initiating the procedure. Ultrasound was utilized to confirm patency of the right internal jugular vein. The right neck and chest were prepped with chlorhexidine in a sterile fashion, and a sterile drape was applied covering the operative field. Maximum barrier sterile technique with sterile gowns and gloves were used for the procedure. Local anesthesia was provided with 1% lidocaine. After creating a small venotomy incision, a 21 gauge needle was advanced into the right internal jugular vein under direct, real-time ultrasound guidance. Ultrasound image documentation was performed. After securing guidewire access, an 8 Fr dilator was placed. A J-wire was kinked to measure appropriate catheter length. A subcutaneous port pocket was then created along the upper chest wall utilizing sharp and blunt dissection. Portable cautery was utilized. The pocket was irrigated with sterile  saline. A single lumen power injectable port was chosen for placement. The 8 Fr catheter was tunneled from the port pocket site to the venotomy incision. The port was placed in the pocket. External catheter was trimmed to appropriate length based on guidewire measurement. At the venotomy, an 8 Fr peel-away sheath was placed over a guidewire. The catheter was then placed through the sheath and the sheath removed. Final catheter positioning was confirmed and documented with a fluoroscopic spot image. The port was accessed with a needle and aspirated and flushed with heparinized saline. The access needle was removed. The venotomy and port pocket incisions were closed with subcutaneous 3-0 Monocryl and subcuticular 4-0 Vicryl. Dermabond was applied to both incisions. COMPLICATIONS: COMPLICATIONS None FINDINGS: After catheter placement, the tip lies at the cavo-atrial junction. The catheter aspirates normally and is ready for immediate use. IMPRESSION: Placement of single lumen port a cath via right internal jugular vein. The catheter tip lies at the cavo-atrial junction. A power injectable port a cath was placed and is ready for immediate use. Electronically Signed   By: Aletta Edouard M.D.   On: 07/17/2016 16:36  Ir Fluoro Guide Port Insertion Right  Result Date: 07/17/2016 CLINICAL DATA:  History of sickle cell anemia, poor IV access and prior port placements. The patient requires a new Port-A-Cath. EXAM: IMPLANTED PORT A CATH PLACEMENT WITH ULTRASOUND AND FLUOROSCOPIC GUIDANCE ANESTHESIA/SEDATION: 10.0 mg IV Versed; 200 mcg IV Fentanyl Total Moderate Sedation Time:  42 minutes The patient's level of consciousness and physiologic status were continuously monitored during the procedure by Radiology nursing. Additional Medications: 2 g IV Ancef. As antibiotic prophylaxis, Ancef was ordered pre-procedure and administered intravenously within one hour of incision. FLUOROSCOPY TIME:  18 seconds.  4.0 mGy. PROCEDURE:  The procedure, risks, benefits, and alternatives were explained to the patient. Questions regarding the procedure were encouraged and answered. The patient understands and consents to the procedure. A time-out was performed prior to initiating the procedure. Ultrasound was utilized to confirm patency of the right internal jugular vein. The right neck and chest were prepped with chlorhexidine in a sterile fashion, and a sterile drape was applied covering the operative field. Maximum barrier sterile technique with sterile gowns and gloves were used for the procedure. Local anesthesia was provided with 1% lidocaine. After creating a small venotomy incision, a 21 gauge needle was advanced into the right internal jugular vein under direct, real-time ultrasound guidance. Ultrasound image documentation was performed. After securing guidewire access, an 8 Fr dilator was placed. A J-wire was kinked to measure appropriate catheter length. A subcutaneous port pocket was then created along the upper chest wall utilizing sharp and blunt dissection. Portable cautery was utilized. The pocket was irrigated with sterile saline. A single lumen power injectable port was chosen for placement. The 8 Fr catheter was tunneled from the port pocket site to the venotomy incision. The port was placed in the pocket. External catheter was trimmed to appropriate length based on guidewire measurement. At the venotomy, an 8 Fr peel-away sheath was placed over a guidewire. The catheter was then placed through the sheath and the sheath removed. Final catheter positioning was confirmed and documented with a fluoroscopic spot image. The port was accessed with a needle and aspirated and flushed with heparinized saline. The access needle was removed. The venotomy and port pocket incisions were closed with subcutaneous 3-0 Monocryl and subcuticular 4-0 Vicryl. Dermabond was applied to both incisions. COMPLICATIONS: COMPLICATIONS None FINDINGS: After  catheter placement, the tip lies at the cavo-atrial junction. The catheter aspirates normally and is ready for immediate use. IMPRESSION: Placement of single lumen port a cath via right internal jugular vein. The catheter tip lies at the cavo-atrial junction. A power injectable port a cath was placed and is ready for immediate use. Electronically Signed   By: Aletta Edouard M.D.   On: 07/17/2016 16:36    Scheduled Meds: . folic acid  1 mg Oral Daily  . HYDROmorphone   Intravenous Q4H  . hydroxyurea  1,000 mg Oral Daily  . ibuprofen  800 mg Oral TID  . morphine  15 mg Oral Q12H  . oxyCODONE  15 mg Oral Q4H  . senna-docusate  1 tablet Oral BID   Continuous Infusions: . dextrose 5 % and 0.45% NaCl 1,000 mL (08/01/16 1951)    Active Problems:   Sickle cell anemia with crisis ([facility])   Sickle cell anemia with pain ([facility])    In excess of 25 minutes spent during this visit. Greater than 50% involved face to face contact with the patient for assessment, counseling and coordination of care.
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
## 2016-08-03 NOTE — Progress Notes (Signed)
Patient refusing port reaccess tonight despite education and knowledge of risks.  Patient reports she anticipates discharge in am if labs look ok.Azzie Glatter [place]

## 2016-08-03 NOTE — Care Management Important Message (Signed)
Important Message  Patient Details  Name: OBIE SCRAGG MRN: [id number] Date of Birth: 1981/07/04   Medicare Important Message Given:  Yes    Camillo Flaming 08/03/2016, 11:56 AMImportant Message  Patient Details  Name: HOLLYE BLACKSHER MRN: [id number] Date of Birth: 1980/10/19   Medicare Important Message Given:  Yes    Camillo Flaming 08/03/2016, 11:56 AM

## 2016-08-03 NOTE — Progress Notes (Signed)
Per blood bank personnel - Dawn [name],blood will be ready later tonight or early tomorrow. Md aware.

## 2016-08-03 NOTE — Progress Notes (Signed)
Patient oxygen level at rest was 92% RA. Ambulated in the hallway O2 sat went down to 87%. Patient stated she felt slightly SOB. MD aware.

## 2016-08-04 DIAGNOSIS — R0902 Hypoxemia: Secondary | ICD-10-CM

## 2016-08-04 LAB — CBC
HCT: 21.3 % — ABNORMAL LOW (ref 36.0–46.0)
HEMOGLOBIN: 7.7 g/dL — AB (ref 12.0–15.0)
MCH: 35.6 pg — ABNORMAL HIGH (ref 26.0–34.0)
MCHC: 36.2 g/dL — AB (ref 30.0–36.0)
MCV: 98.6 fL (ref 78.0–100.0)
Platelets: 104 10*3/uL — ABNORMAL LOW (ref 150–400)
RBC: 2.16 MIL/uL — ABNORMAL LOW (ref 3.87–5.11)
RDW: 25.1 % — AB (ref 11.5–15.5)
WBC: 6.5 10*3/uL (ref 4.0–10.5)

## 2016-08-04 LAB — RETICULOCYTES
RBC.: 2.1 MIL/uL — AB (ref 3.87–5.11)
RETIC CT PCT: 16.1 % — AB (ref 0.4–3.1)
Retic Count, Absolute: 338.1 10*3/uL — ABNORMAL HIGH (ref 19.0–186.0)

## 2016-08-04 MED ORDER — HEPARIN SOD (PORK) LOCK FLUSH 100 UNIT/ML IV SOLN
500.0000 [IU] | Freq: Once | INTRAVENOUS | Status: AC
  Administered 2016-08-04: 500 [IU] via INTRAVENOUS
  Filled 2016-08-04: qty 5

## 2016-08-04 NOTE — Progress Notes (Signed)
Discharge teaching completed with teach back. Discharge instructions given and reviewed with pt. Awaiting ride.

## 2016-08-04 NOTE — Progress Notes (Signed)
Pt. left via wheelchair, discharged to home. No respiratory distress noted. 

## 2016-08-04 NOTE — Discharge Instructions (Signed)
Hydroxyurea capsules What is this medicine? HYDROXYUREA (hye drox ee yoor EE a) is a chemotherapy drug. This medicine is used to treat certain types of leukemias and head and neck cancer. It is also used to control the painful crises of sickle cell anemia. This medicine may be used for other purposes; ask your health care provider or pharmacist if you have questions. What should I tell my health care provider before I take this medicine? They need to know if you have any of these conditions: -HIV or AIDS -kidney disease or on hemodialysis -liver disease -low blood counts, like low white cell, platelet, or red cell counts -prior or current interferon therapy -recent or ongoing radiation therapy -an unusual or allergic reaction to hydroxyurea, other chemotherapy, other medicines, foods, dyes, or preservatives -pregnant or trying to get pregnant -breast-feeding How should I use this medicine? Take this medicine by mouth with a glass of water. Follow the directions on the prescription label. Take your medicine at regular intervals. Do not take it more often than directed. Do not stop taking except on your doctor's advice. People who are not taking this medicine should not be exposed to it. Wash your hands before and after handling your bottle or medicine. Caregivers should wear disposable gloves if they must touch the bottle or medicine. Clean up any medicine powder that spills with a damp disposable towel and throw the towel away in a closed container, such as a plastic bag. Talk to your pediatrician regarding the use of this medicine in children. Special care may be needed. Overdosage: If you think you have taken too much of this medicine contact a poison control center or emergency room at once. NOTE: This medicine is only for you. Do not share this medicine with others. What if I miss a dose? If you miss a dose, take it as soon as you can. If it is almost time for your next dose, take only that  dose. Do not take double or extra doses. What may interact with this medicine? This medicine may also interact with the following medications: -didanosine -stavudine -live virus vaccines This list may not describe all possible interactions. Give your health care provider a list of all the medicines, herbs, non-prescription drugs, or dietary supplements you use. Also tell them if you smoke, drink alcohol, or use illegal drugs. Some items may interact with your medicine. What should I watch for while using this medicine? This drug may make you feel generally unwell. This is not uncommon, as chemotherapy can affect healthy cells as well as cancer cells. Report any side effects. Continue your course of treatment even though you feel ill unless your doctor tells you to stop. You will receive regular blood tests during your treatment. Call your doctor or health care professional for advice if you get a fever, chills or sore throat, or other symptoms of a cold or flu. Do not treat yourself. This drug decreases your body's ability to fight infections. Try to avoid being around people who are sick. This medicine may increase your risk to bruise or bleed. Call your doctor or health care professional if you notice any unusual bleeding. Talk to your doctor about your risk of cancer. You may be more at risk for certain types of cancers if you take this medicine. You may need blood work done while you are taking this medicine. Do not become pregnant while taking this medicine or for at least 6 months after stopping it. Women should inform their  doctor if they wish to become pregnant or think they might be pregnant. Men should not father a child while taking this medicine and for at least a year after stopping it. There is a potential for serious side effects to an unborn child. Talk to your health care professional or pharmacist for more information. Do not breast-feed an infant while taking this medicine. This may  interfere with the ability to have or father a child. You should talk with your doctor or health care professional if you are concerned about your fertility. What side effects may I notice from receiving this medicine? Side effects that you should report to your doctor or health care professional as soon as possible: -allergic reactions like skin rash, itching or hives, swelling of the face, lips, or tongue -low blood counts - this medicine may decrease the number of white blood cells, red blood cells and platelets. You may be at increased risk for infections and bleeding. -signs of infection - fever or chills, cough, sore throat, pain or difficulty passing urine -signs of decreased platelets or bleeding - bruising, pinpoint red spots on the skin, black, tarry stools, blood in the urine -signs of decreased red blood cells - unusually weak or tired, fainting spells, lightheadedness -breathing problems -burning, redness or pain at the site of any radiation therapy -changes in skin color -confusion -mouth sores -pain, tingling, numbness in the hands or feet -seizures -skin ulcers -trouble passing urine or change in the amount of urine -vomiting Side effects that usually do not require medical attention (report to your doctor or health care professional if they continue or are bothersome): -headache -loss of appetite -red color to the face This list may not describe all possible side effects. Call your doctor for medical advice about side effects. You may report side effects to FDA at 1-800-FDA-1088. Where should I keep my medicine? Keep out of the reach of children. See product for storage instructions. Each product may have different instructions. Keep tightly closed. Throw away any unused medicine after the expiration date. NOTE: This sheet is a summary. It may not cover all possible information. If you have questions about this medicine, talk to your doctor, pharmacist, or health care  provider.    2016, Elsevier/Gold Standard. (2014-12-24 16:51:41) Sickle Cell Anemia, Adult Sickle cell anemia is a condition in which red blood cells have an abnormal "sickle" shape. This abnormal shape shortens the cells' life span, which results in a lower than normal concentration of red blood cells in the blood. The sickle shape also causes the cells to clump together and block free blood flow through the blood vessels. As a result, the tissues and organs of the body do not receive enough oxygen. Sickle cell anemia causes organ damage and pain and increases the risk of infection. CAUSES  Sickle cell anemia is a genetic disorder. Those who receive two copies of the gene have the condition, and those who receive one copy have the trait. RISK FACTORS The sickle cell gene is most common in people whose families originated in [place]. Other areas of the globe where sickle cell trait occurs include the [place], [place] and [place], and the [place].  SIGNS AND SYMPTOMS  Pain, especially in the extremities, back, chest, or abdomen (common). The pain may start suddenly or may develop following an illness, especially if there is dehydration. Pain can also occur due to overexertion or exposure to extreme temperature changes.  Frequent severe bacterial infections, especially certain  types of pneumonia and meningitis.  Pain and swelling in the hands and feet.  Decreased activity.   Loss of appetite.   Change in behavior.  Headaches.  Seizures.  Shortness of breath or difficulty breathing.  Vision changes.  Skin ulcers. Those with the trait may not have symptoms or they may have mild symptoms.  DIAGNOSIS  Sickle cell anemia is diagnosed with blood tests that demonstrate the genetic trait. It is often diagnosed during the newborn period, due to mandatory testing nationwide. A variety of blood tests, X-rays, CT scans, MRI scans, ultrasounds, and lung function  tests may also be done to monitor the condition. TREATMENT  Sickle cell anemia may be treated with:  Medicines. You may be given pain medicines, antibiotic medicines (to treat and prevent infections) or medicines to increase the production of certain types of hemoglobin.  Fluids.  Oxygen.  Blood transfusions. HOME CARE INSTRUCTIONS   Drink enough fluid to keep your urine clear or pale yellow. Increase your fluid intake in hot weather and during exercise.  Do not smoke. Smoking lowers oxygen levels in the blood.   Only take over-the-counter or prescription medicines for pain, fever, or discomfort as directed by your health care provider.  Take antibiotics as directed by your health care provider. Make sure you finish them it even if you start to feel better.   Take supplements as directed by your health care provider.   Consider wearing a medical alert bracelet. This tells anyone caring for you in an emergency of your condition.   When traveling, keep your medical information, health care provider's names, and the medicines you take with you at all times.   If you develop a fever, do not take medicines to reduce the fever right away. This could cover up a problem that is developing. Notify your health care provider.  Keep all follow-up appointments with your health care provider. Sickle cell anemia requires regular medical care. SEEK MEDICAL CARE IF: You have a fever. SEEK IMMEDIATE MEDICAL CARE IF:   You feel dizzy or faint.   You have new abdominal pain, especially on the left side near the stomach area.   You develop a persistent, often uncomfortable and painful penile erection (priapism). If this is not treated immediately it will lead to impotence.   You have numbness your arms or legs or you have a hard time moving them.   You have a hard time with speech.   You have a fever or persistent symptoms for more than 2-3 days.   You have a fever and your  symptoms suddenly get worse.   You have signs or symptoms of infection. These include:   Chills.   Abnormal tiredness (lethargy).   Irritability.   Poor eating.   Vomiting.   You develop pain that is not helped with medicine.   You develop shortness of breath.  You have pain in your chest.   You are coughing up pus-like or bloody sputum.   You develop a stiff neck.  Your feet or hands swell or have pain.  Your abdomen appears bloated.  You develop joint pain. MAKE SURE YOU:  Understand these instructions.   This information is not intended to replace advice given to you by your health care provider. Make sure you discuss any questions you have with your health care provider.   Document Released: 12/19/2005 Document Revised: 10/01/2014 Document Reviewed: 04/22/2013 Elsevier Interactive Patient Education [employer].

## 2016-08-04 NOTE — Discharge Summary (Signed)
Physician Discharge Summary  [name] [id number] DOB: [birth year] DOA: 07/26/2016  PCP: Angelica Chessman, MD  Admit date: 07/26/2016  Discharge date: 08/04/2016  Discharge Diagnoses:  Active Problems:   Sickle cell anemia with crisis (HCC)   Sickle cell anemia with pain (HCC)  Discharge Condition: Stable  Disposition:  Follow-up Information    Barrie Wale, MD Follow up in 1 week(s).   Specialty:  Internal Medicine Contact information: [place] [id number] [phone]          Diet: Regular  Wt Readings from Last 3 Encounters:  08/02/16 87.6 kg (193 lb 2 oz)  07/26/16 76.7 kg (169 lb)  07/20/16 76.7 kg (169 lb)   History of present illness:  Dawn [name] is a 35 y.o. female with history of sickle cell disease, opiate tolerant. She presents to the day hospital today for a pain crisis that started 3 days ago with pain in her legs and lower back rated at 8/10, constant, aching and throbbing. She was in the ED on 07/20/2016 for the same pain. She took her home pain medications this morning without sustained relief. She reports nothing different about this episode. She denies chest pain, cough, SOB. No N/V/D, no abd pain, no dysuria, no fever or chils. She reports taking her hydrea and folic acid regularly.  Hospital Course:  Patient was transferred to inpatient admission from the day hospital because of ongoing significant sickle cell pain crisis. Patient had a prolonged hospital stay, had a total of 2 units of PRBC transfusion at different time. Hb on discharge was 7.7. Platelet count down to about 80 on admission, Lovenox discontinued and hydroxyurea dosage reduced to 1000 mg daily since patient has a robust retic count, she needs the hydroxyurea. Pain crisis was managed appropriately with IVF, IV weight based Dilaudid via PCA, plus clinician assisted doses, IV Toradol and other adjunct therapies. All cultures were negative throughout admission.  Patient slowly got better in all her symptoms including pain and weakness, she felt ready for discharge home today and she was discharged in a hemodynamically stable condition to follow-up with me in the clinic on 08/07/2016. We will recheck her Hb and decide on bumping up her hydroxyurea or not during the visit.  Discharge Exam: Vitals:   08/04/16 0924 08/04/16 1000  BP:  119/76  Pulse:  92  Resp: 11 14  Temp:  98.9 F (37.2 C)   Vitals:   08/04/16 0428 08/04/16 0504 08/04/16 0924 08/04/16 1000  BP:  135/77  119/76  Pulse:  87  92  Resp: 10 13 11 14   Temp:  98.3 F (36.8 C)  98.9 F (37.2 C)  TempSrc:  Oral  Oral  SpO2: 97% 97% 97% 96%  Weight:      Height:       General appearance : Awake, alert, not in any distress. Speech Clear. Not toxic looking HEENT: Atraumatic and Normocephalic, pupils equally reactive to light and accomodation Neck: Supple, no JVD. No cervical lymphadenopathy.  Chest: Good air entry bilaterally, no added sounds  CVS: S1 S2 regular, no murmurs.  Abdomen: Bowel sounds present, Non tender and not distended with no gaurding, rigidity or rebound. Extremities: B/L Lower Ext shows no edema, both legs are warm to touch Neurology: Awake alert, and oriented X 3, CN II-XII intact, Non focal Skin: No Rash  Discharge Instructions    Medication List    TAKE these medications   folic acid 1 MG tablet  Commonly known as:  FOLVITE Take 1 tablet (1 mg total) by mouth daily.   hydroxyurea 500 MG capsule Commonly known as:  HYDREA Take 2 capsules (1,000 mg total) by mouth daily. May take with food to minimize GI side effects.   levonorgestrel 20 MCG/24HR IUD Commonly known as:  MIRENA 1 each by Intrauterine route once.   loratadine 10 MG tablet Commonly known as:  CLARITIN Take 10 mg by mouth daily.   morphine 15 MG 12 hr tablet Commonly known as:  MS CONTIN Take 1 tablet (15 mg total) by mouth every 12 (twelve) hours.   multivitamin with minerals Tabs  tablet Take 1 tablet by mouth daily.   Oxycodone HCl 10 MG Tabs Take 1 tablet (10 mg total) by mouth every 4 (four) hours as needed (breakthrough pain).   promethazine 25 MG tablet Commonly known as:  PHENERGAN Take 1 tablet (25 mg total) by mouth every 6 (six) hours as needed for nausea or vomiting.   Vitamin D (Ergocalciferol) 50000 units Caps capsule Commonly known as:  DRISDOL Take 1 capsule (50,000 Units total) by mouth every 7 (seven) days. Takes on Sundays.      The results of significant diagnostics from this hospitalization (including imaging, microbiology, ancillary and laboratory) are listed below for reference.    Significant Diagnostic Studies: Dg Chest 2 View  Result Date: 08/03/2016 CLINICAL DATA:  Patient with shortness of breath.  Pneumonia. EXAM: CHEST  2 VIEW COMPARISON:  Chest radiograph 04/28/2016. FINDINGS: Right anterior chest wall Port-A-Cath is present with tip projecting over the superior vena cava. Stable cardiomegaly. No large area of pulmonary consolidation. No pleural effusion or pneumothorax. Thoracic spine degenerative changes. IMPRESSION: No active cardiopulmonary disease. Electronically Signed   By: Lovey Newcomer M.D.   On: 08/03/2016 18:00   Ir US Guide Vasc Access Right  Result Date: 07/17/2016 CLINICAL DATA:  History of sickle cell anemia, poor IV access and prior port placements. The patient requires a new Port-A-Cath. EXAM: IMPLANTED PORT A CATH PLACEMENT WITH ULTRASOUND AND FLUOROSCOPIC GUIDANCE ANESTHESIA/SEDATION: 10.0 mg IV Versed; 200 mcg IV Fentanyl Total Moderate Sedation Time:  42 minutes The patient's level of consciousness and physiologic status were continuously monitored during the procedure by Radiology nursing. Additional Medications: 2 g IV Ancef. As antibiotic prophylaxis, Ancef was ordered pre-procedure and administered intravenously within one hour of incision. FLUOROSCOPY TIME:  18 seconds.  4.0 mGy. PROCEDURE: The procedure, risks,  benefits, and alternatives were explained to the patient. Questions regarding the procedure were encouraged and answered. The patient understands and consents to the procedure. A time-out was performed prior to initiating the procedure. Ultrasound was utilized to confirm patency of the right internal jugular vein. The right neck and chest were prepped with chlorhexidine in a sterile fashion, and a sterile drape was applied covering the operative field. Maximum barrier sterile technique with sterile gowns and gloves were used for the procedure. Local anesthesia was provided with 1% lidocaine. After creating a small venotomy incision, a 21 gauge needle was advanced into the right internal jugular vein under direct, real-time ultrasound guidance. Ultrasound image documentation was performed. After securing guidewire access, an 8 Fr dilator was placed. A J-wire was kinked to measure appropriate catheter length. A subcutaneous port pocket was then created along the upper chest wall utilizing sharp and blunt dissection. Portable cautery was utilized. The pocket was irrigated with sterile saline. A single lumen power injectable port was chosen for placement. The 8 Fr catheter was tunneled from  the port pocket site to the venotomy incision. The port was placed in the pocket. External catheter was trimmed to appropriate length based on guidewire measurement. At the venotomy, an 8 Fr peel-away sheath was placed over a guidewire. The catheter was then placed through the sheath and the sheath removed. Final catheter positioning was confirmed and documented with a fluoroscopic spot image. The port was accessed with a needle and aspirated and flushed with heparinized saline. The access needle was removed. The venotomy and port pocket incisions were closed with subcutaneous 3-0 Monocryl and subcuticular 4-0 Vicryl. Dermabond was applied to both incisions. COMPLICATIONS: COMPLICATIONS None FINDINGS: After catheter placement, the  tip lies at the cavo-atrial junction. The catheter aspirates normally and is ready for immediate use. IMPRESSION: Placement of single lumen port a cath via right internal jugular vein. The catheter tip lies at the cavo-atrial junction. A power injectable port a cath was placed and is ready for immediate use. Electronically Signed   By: Aletta Edouard M.D.   On: 07/17/2016 16:36   Ir Fluoro Guide Port Insertion Right  Result Date: 07/17/2016 CLINICAL DATA:  History of sickle cell anemia, poor IV access and prior port placements. The patient requires a new Port-A-Cath. EXAM: IMPLANTED PORT A CATH PLACEMENT WITH ULTRASOUND AND FLUOROSCOPIC GUIDANCE ANESTHESIA/SEDATION: 10.0 mg IV Versed; 200 mcg IV Fentanyl Total Moderate Sedation Time:  42 minutes The patient's level of consciousness and physiologic status were continuously monitored during the procedure by Radiology nursing. Additional Medications: 2 g IV Ancef. As antibiotic prophylaxis, Ancef was ordered pre-procedure and administered intravenously within one hour of incision. FLUOROSCOPY TIME:  18 seconds.  4.0 mGy. PROCEDURE: The procedure, risks, benefits, and alternatives were explained to the patient. Questions regarding the procedure were encouraged and answered. The patient understands and consents to the procedure. A time-out was performed prior to initiating the procedure. Ultrasound was utilized to confirm patency of the right internal jugular vein. The right neck and chest were prepped with chlorhexidine in a sterile fashion, and a sterile drape was applied covering the operative field. Maximum barrier sterile technique with sterile gowns and gloves were used for the procedure. Local anesthesia was provided with 1% lidocaine. After creating a small venotomy incision, a 21 gauge needle was advanced into the right internal jugular vein under direct, real-time ultrasound guidance. Ultrasound image documentation was performed. After securing guidewire  access, an 8 Fr dilator was placed. A J-wire was kinked to measure appropriate catheter length. A subcutaneous port pocket was then created along the upper chest wall utilizing sharp and blunt dissection. Portable cautery was utilized. The pocket was irrigated with sterile saline. A single lumen power injectable port was chosen for placement. The 8 Fr catheter was tunneled from the port pocket site to the venotomy incision. The port was placed in the pocket. External catheter was trimmed to appropriate length based on guidewire measurement. At the venotomy, an 8 Fr peel-away sheath was placed over a guidewire. The catheter was then placed through the sheath and the sheath removed. Final catheter positioning was confirmed and documented with a fluoroscopic spot image. The port was accessed with a needle and aspirated and flushed with heparinized saline. The access needle was removed. The venotomy and port pocket incisions were closed with subcutaneous 3-0 Monocryl and subcuticular 4-0 Vicryl. Dermabond was applied to both incisions. COMPLICATIONS: COMPLICATIONS None FINDINGS: After catheter placement, the tip lies at the cavo-atrial junction. The catheter aspirates normally and is ready for immediate use. IMPRESSION: Placement  of single lumen port a cath via right internal jugular vein. The catheter tip lies at the cavo-atrial junction. A power injectable port a cath was placed and is ready for immediate use. Electronically Signed   By: Aletta Edouard M.D.   On: 07/17/2016 16:36   Microbiology: Recent Results (from the past 240 hour(s))  Culture, Urine     Status: None   Collection Time: 07/30/16  3:40 PM  Result Value Ref Range Status   Specimen Description URINE, CLEAN CATCH  Final   Special Requests NONE  Final   Culture NO GROWTH Performed at [facility]   Final   Report Status 07/31/2016 FINAL  Final    Labs: Basic Metabolic Panel: No results for input(s): NA, K, CL, CO2, GLUCOSE, BUN,  CREATININE, CALCIUM, MG, PHOS in the last 168 hours. Liver Function Tests: No results for input(s): AST, ALT, ALKPHOS, BILITOT, PROT, ALBUMIN in the last 168 hours. No results for input(s): LIPASE, AMYLASE in the last 168 hours. No results for input(s): AMMONIA in the last 168 hours. CBC:  Recent Labs Lab 07/29/16 0633 07/31/16 1040 08/02/16 0715 08/03/16 0950 08/04/16 0550  WBC 9.2 8.9 5.6 6.2 6.5  NEUTROABS 5.3 4.7 2.2 2.7  --   HGB 5.4* 6.0* 6.1* 6.0* 7.7*  HCT 14.2* 15.8* 16.5* 16.0* 21.3*  MCV 106.0* 99.4 102.5* 102.6* 98.6  PLT 105* 94* 86* 80* 104*   Cardiac Enzymes: No results for input(s): CKTOTAL, CKMB, CKMBINDEX, TROPONINI in the last 168 hours. BNP: Invalid input(s): POCBNP CBG: No results for input(s): GLUCAP in the last 168 hours.  Time coordinating discharge: 50 minutes  Signed:  Kamarian Sahakian, [facility] 08/04/2016, 10:29 AM

## 2016-08-07 LAB — TYPE AND SCREEN
ABO/RH(D): A POS
ANTIBODY SCREEN: POSITIVE
DAT, IGG: NEGATIVE
Unit division: 0
Unit division: 0

## 2016-08-15 NOTE — Telephone Encounter (Signed)
ERROR

## 2016-08-22 ENCOUNTER — Encounter (HOSPITAL_COMMUNITY): Payer: Self-pay | Admitting: *Deleted

## 2016-08-22 ENCOUNTER — Emergency Department (HOSPITAL_COMMUNITY)
Admission: EM | Admit: 2016-08-22 | Discharge: 2016-08-22 | Disposition: A | Discharge status: Home / Self Care | Payer: Medicare Other | Attending: Emergency Medicine | Admitting: Emergency Medicine

## 2016-08-22 DIAGNOSIS — D5700 Hb-SS disease with crisis, unspecified: Secondary | ICD-10-CM | POA: Insufficient documentation

## 2016-08-22 DIAGNOSIS — Z91040 Latex allergy status: Secondary | ICD-10-CM | POA: Insufficient documentation

## 2016-08-22 DIAGNOSIS — E876 Hypokalemia: Secondary | ICD-10-CM | POA: Diagnosis not present

## 2016-08-22 DIAGNOSIS — Z79899 Other long term (current) drug therapy: Secondary | ICD-10-CM | POA: Insufficient documentation

## 2016-08-22 DIAGNOSIS — M545 Low back pain: Secondary | ICD-10-CM | POA: Diagnosis present

## 2016-08-22 LAB — CBC WITH DIFFERENTIAL/PLATELET
BASOS ABS: 0 10*3/uL (ref 0.0–0.1)
Basophils Relative: 1 %
EOS PCT: 2 %
Eosinophils Absolute: 0.1 10*3/uL (ref 0.0–0.7)
HEMATOCRIT: 23.4 % — AB (ref 36.0–46.0)
Hemoglobin: 8.4 g/dL — ABNORMAL LOW (ref 12.0–15.0)
LYMPHS ABS: 1.8 10*3/uL (ref 0.7–4.0)
LYMPHS PCT: 35 %
MCH: 37.2 pg — AB (ref 26.0–34.0)
MCHC: 35.9 g/dL (ref 30.0–36.0)
MCV: 103.5 fL — AB (ref 78.0–100.0)
MONO ABS: 1 10*3/uL (ref 0.1–1.0)
MONOS PCT: 19 %
NEUTROS ABS: 2.4 10*3/uL (ref 1.7–7.7)
Neutrophils Relative %: 44 %
PLATELETS: 214 10*3/uL (ref 150–400)
RBC: 2.26 MIL/uL — ABNORMAL LOW (ref 3.87–5.11)
RDW: 20.1 % — AB (ref 11.5–15.5)
WBC: 5.3 10*3/uL (ref 4.0–10.5)

## 2016-08-22 LAB — COMPREHENSIVE METABOLIC PANEL
ALBUMIN: 2.7 g/dL — AB (ref 3.5–5.0)
ALT: 38 U/L (ref 14–54)
ANION GAP: 7 (ref 5–15)
AST: 86 U/L — AB (ref 15–41)
Alkaline Phosphatase: 126 U/L (ref 38–126)
BILIRUBIN TOTAL: 11.5 mg/dL — AB (ref 0.3–1.2)
BUN: 5 mg/dL — AB (ref 6–20)
CHLORIDE: 105 mmol/L (ref 101–111)
CO2: 25 mmol/L (ref 22–32)
Calcium: 8.3 mg/dL — ABNORMAL LOW (ref 8.9–10.3)
Creatinine, Ser: 0.49 mg/dL (ref 0.44–1.00)
GFR calc Af Amer: >60 mL/min (ref >60)
GFR calc non Af Amer: >60 mL/min (ref >60)
GLUCOSE: 127 mg/dL — AB (ref 65–99)
POTASSIUM: 2.9 mmol/L — AB (ref 3.5–5.1)
SODIUM: 137 mmol/L (ref 135–145)
TOTAL PROTEIN: 7.5 g/dL (ref 6.5–8.1)

## 2016-08-22 LAB — RETICULOCYTES
RBC.: 2.26 MIL/uL — ABNORMAL LOW (ref 3.87–5.11)
RETIC COUNT ABSOLUTE: 316.4 10*3/uL — AB (ref 19.0–186.0)
Retic Ct Pct: 14 % — ABNORMAL HIGH (ref 0.4–3.1)

## 2016-08-22 LAB — POC URINE PREG, ED: Preg Test, Ur: NEGATIVE

## 2016-08-22 MED ORDER — DIPHENHYDRAMINE HCL 50 MG/ML IJ SOLN
25.0000 mg | Freq: Once | INTRAMUSCULAR | Status: AC
  Administered 2016-08-22: 50 mg via INTRAVENOUS
  Filled 2016-08-22: qty 1

## 2016-08-22 MED ORDER — POTASSIUM CHLORIDE CRYS ER 20 MEQ PO TBCR: 40.0000 meq | EXTENDED_RELEASE_TABLET | Freq: Every day | ORAL | 0 refills | Status: DC

## 2016-08-22 MED ORDER — HYDROMORPHONE HCL 2 MG/ML IJ SOLN
2.0000 mg | Freq: Once | INTRAMUSCULAR | Status: AC
  Administered 2016-08-22: 2 mg via INTRAVENOUS
  Filled 2016-08-22: qty 1

## 2016-08-22 MED ORDER — POTASSIUM CHLORIDE CRYS ER 20 MEQ PO TBCR
40.0000 meq | EXTENDED_RELEASE_TABLET | Freq: Once | ORAL | Status: AC
  Administered 2016-08-22: 40 meq via ORAL
  Filled 2016-08-22: qty 2

## 2016-08-22 MED ORDER — PROMETHAZINE HCL 25 MG/ML IJ SOLN
12.5000 mg | Freq: Once | INTRAMUSCULAR | Status: AC
  Administered 2016-08-22: 12.5 mg via INTRAVENOUS
  Filled 2016-08-22: qty 1

## 2016-08-22 MED ORDER — SODIUM CHLORIDE 0.9 % IV BOLUS (SEPSIS)
1000.0000 mL | Freq: Once | INTRAVENOUS | Status: AC
  Administered 2016-08-22: 1000 mL via INTRAVENOUS

## 2016-08-22 MED ORDER — HEPARIN SOD (PORK) LOCK FLUSH 100 UNIT/ML IV SOLN
INTRAVENOUS | Status: AC
  Filled 2016-08-22: qty 5

## 2016-08-22 MED ORDER — ONDANSETRON HCL 4 MG/2ML IJ SOLN
4.0000 mg | Freq: Once | INTRAMUSCULAR | Status: AC
  Administered 2016-08-22: 4 mg via INTRAVENOUS
  Filled 2016-08-22: qty 2

## 2016-08-22 NOTE — ED Triage Notes (Signed)
Pt c/o back and bilateral leg sickle cell pain that started Sunday. Pt reports she took MS Contin and Oxycodone at 0700 this morning with slight relief. Pt reports pain is 8/10 and is sharp and constant.

## 2016-08-22 NOTE — Discharge Instructions (Signed)
Take the potassium daily as directed. Follow-up with the sickle cell clinic next week. You will need to have your potassium level rechecked. Return to ER for any worsening symptoms

## 2016-08-22 NOTE — ED Provider Notes (Signed)
[facility] DEPT Provider Note   CSN: [id number] Arrival date & time: 08/22/16  0830     History   Chief Complaint Chief Complaint  Patient presents with  . Sickle Cell Pain Crisis    HPI Dawn [name] is a 35 y.o. female.  HPI   Dawn [name] is a 35 y.o. female with hx of sickle cell disease, who presents to the Emergency Department complaining of Gradually worsening bilateral leg and lower back pain that began 3 days ago. She states pain is worse this morning. She states pain is similar to previous sickle cell pain describes as a sharp aching pain in her lower back and anterior legs. She's been taking her typical pain medications which include oxycodone and MS Contin for pain relief she states is not helping control the pain. She denies recent injury, chest pain, shortness of breath, fever chills, pain or vomiting.   Past Medical History:  Diagnosis Date  . Blood transfusion   . Demand ischemia ([place]) 02/06/2016  . HCAP (healthcare-associated pneumonia) 05/19/2013  . 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 Pulmonary hypertension 02/06/2016   49 mmHg per echo on 02/06/16   . 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 Sickle cell anemia (HCC)   . Sickle cell disease ([place])   . 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 Vitamin B12 deficiency 07/07/2015    Patient Active Problem List   Diagnosis Date Noted  . Hypoxia   . Positive D dimer   . Sickle cell anemia with pain ([place]) 04/10/2016  . Pulmonary hypertension 02/16/2016  . 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 Pyrexia   . Sickle-cell/Hb-C disease with crisis ([place]) 02/13/2016  . Anemia 02/13/2016  . Demand ischemia ([place]) 02/13/2016  . Sepsis ([place]) 02/13/2016  . Fever 02/05/2016  . Tachycardia   . Anemia of chronic disease   . Chronic pain syndrome 10/14/2015  . UTI (urinary tract infection) 08/25/2015  . 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 Vitamin B12 deficiency 07/07/2015  . Acute pulmonary edema ([place])  07/06/2015  . Thrombocytopenia ([place]) 07/06/2015  . Acute respiratory failure with hypoxia ([place]) 09/12/2014  . Hyperbilirubinemia 09/11/2014  . Sickle cell disease with crisis ([place]) 09/09/2014  . Anxiety state 06/15/2014  . Nosebleed 06/15/2014  . Macrocytosis 06/13/2014  . Sickle cell crisis ([place]) 06/12/2014  . Sickle cell anemia with crisis ([place]) 05/24/2014  . Vitamin D deficiency 11/23/2013  . 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 Abnormal chest CT 08/07/2013  . Pain in the chest 08/03/2013  . Fungemia 07/28/2013  . Fever, unspecified 07/27/2013  . 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 Hypoxemia 05/14/2013  . 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 Hypokalemia 03/28/2012  . Hypoalbuminemia 03/28/2012  . 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 Leukocytosis 05/26/2011  . Elevated LFTs 04/27/2011  . Sickle cell anemia ([place]) 04/27/2011  . Sickle cell disease ([place]) 04/26/2011    Past Surgical History:  Procedure Laterality Date  .  left knee ACL reconstruction    . CESAREAN SECTION     x 2  . CHOLECYSTECTOMY    . IR GENERIC HISTORICAL  07/17/2016   IR FLUORO GUIDE PORT INSERTION RIGHT 07/17/2016 Aletta Edouard, MD [facility]-INTER[facility] RAD  . IR GENERIC HISTORICAL  07/17/2016   IR US GUIDE VASC ACCESS RIGHT 07/17/2016 Aletta Edouard, MD [facility]  . PORT-A-CATH REMOVAL Left 07/29/2013   Procedure: REMOVAL PORT-A-CATH;  Surgeon: Scherry Ran, MD;  Location: [facility] ORS;  Service:  General;  Laterality: Left;  . PORTACATH PLACEMENT    . PORTACATH PLACEMENT Left 02/23/2013   Procedure: INSERTION PORT-A-CATH;  Surgeon: Donato Heinz, MD;  Location: [facility] ORS;  Service: General;  Laterality: Left;  . TEE WITHOUT CARDIOVERSION N/A 08/07/2013   Procedure: TRANSESOPHAGEAL ECHOCARDIOGRAM (TEE);  Surgeon: Arnoldo Lenis, MD;  Location: [facility] ENDO SUITE;  Service: Cardiology;  Laterality: N/A;    OB History    Gravida Para Term Preterm AB  Living   1 1   1   1    SAB TAB Ectopic Multiple Live Births                   Home Medications    Prior to Admission medications   Medication Sig Start Date End Date Taking? Authorizing Provider  folic acid (FOLVITE) 1 MG tablet Take 1 tablet (1 mg total) by mouth daily. 03/14/16   [name], FNP  hydroxyurea (HYDREA) 500 MG capsule Take 2 capsules (1,000 mg total) by mouth daily. May take with food to minimize GI side effects. 03/14/16   [name], FNP  levonorgestrel (MIRENA) 20 MCG/24HR IUD 1 each by Intrauterine route once.    Historical Provider, MD  loratadine (CLARITIN) 10 MG tablet Take 10 mg by mouth daily.    Historical Provider, MD  Multiple Vitamin (MULTIVITAMIN WITH MINERALS) TABS tablet Take 1 tablet by mouth daily.    Historical Provider, MD  promethazine (PHENERGAN) 25 MG tablet Take 1 tablet (25 mg total) by mouth every 6 (six) hours as needed for nausea or vomiting. 07/20/16   Tresa Garter, MD  Vitamin D, Ergocalciferol, (DRISDOL) 50000 units CAPS capsule Take 1 capsule (50,000 Units total) by mouth every 7 (seven) days. Takes on Sundays. 06/01/16   Micheline Chapman, NP    Family History Family History  Problem Relation Age of Onset  . Sickle cell trait Mother   . Sickle cell trait Father   . Hypertension Father   . Diabetes Father   . Sickle cell trait Sister   . Cancer Paternal Aunt     Breast    Social History Social History  Substance Use Topics  . Smoking status: Never Smoker  . Smokeless tobacco: Never Used  . Alcohol use No     Allergies   Desferal [deferoxamine]; Latex; Lisinopril; Tape; and Tylenol [acetaminophen]   Review of Systems Review of Systems  Constitutional: Negative for appetite change, chills and fever.  HENT: Negative for sore throat.   Respiratory: Negative for chest tightness and shortness of breath.   Cardiovascular: Negative for chest pain.  Gastrointestinal: Negative for abdominal pain, diarrhea, nausea  and vomiting.  Genitourinary: Negative for dysuria and flank pain.  Musculoskeletal: Positive for back pain and myalgias.  Neurological: Negative for dizziness, weakness, numbness and headaches.  Psychiatric/Behavioral: Negative for confusion.     Physical Exam Updated Vital Signs BP 141/91   Pulse 97   Temp 98.3 F (36.8 C) (Oral)   Resp 16   Ht 5' 3.5" (1.613 m)   Wt 75.8 kg   SpO2 97%   BMI 29.12 kg/m   Physical Exam  Constitutional: She is oriented to person, place, and time.  HENT:  Head: Atraumatic.  Mouth/Throat: Oropharynx is clear and moist.  Eyes: Pupils are equal, round, and reactive to light. Scleral icterus is present.  Bilateral scleral icterus  Neck: Normal range of motion. No thyromegaly present.  Cardiovascular: Normal rate, regular rhythm and intact distal pulses.  No murmur heard. Pulmonary/Chest: Effort normal and breath sounds normal. No respiratory distress. She exhibits no tenderness.  Abdominal: Soft. She exhibits no distension. There is no tenderness. There is no guarding.  Musculoskeletal: Normal range of motion. She exhibits tenderness.  ttp of the bilateral lower lumbar paraspinal muscles.  No spinal tenderness.  Tenderness noted of bilateral anterior upper legs.  Distal sensation intact.  No erythema or edema.  Lymphadenopathy:    She has no cervical adenopathy.  Neurological: She is alert and oriented to person, place, and time. She exhibits normal muscle tone. Coordination normal.  Skin: Skin is warm. No rash noted.  Psychiatric: She has a normal mood and affect.  Nursing note and vitals reviewed.    ED Treatments / Results  Labs (all labs ordered are listed, but only abnormal results are displayed) Labs Reviewed  COMPREHENSIVE METABOLIC PANEL - Abnormal; Notable for the following:       Result Value   Potassium 2.9 (*)    Glucose, Bld 127 (*)    BUN 5 (*)    Calcium 8.3 (*)    Albumin 2.7 (*)    AST 86 (*)    Total Bilirubin  11.5 (*)    All other components within normal limits  CBC WITH DIFFERENTIAL/PLATELET - Abnormal; Notable for the following:    RBC 2.26 (*)    Hemoglobin 8.4 (*)    HCT 23.4 (*)    MCV 103.5 (*)    MCH 37.2 (*)    RDW 20.1 (*)    All other components within normal limits  RETICULOCYTES - Abnormal; Notable for the following:    Retic Ct Pct 14.0 (*)    RBC. 2.26 (*)    Retic Count, Manual 316.4 (*)    All other components within normal limits  POC URINE PREG, ED    EKG  EKG Interpretation None       Radiology No results found.  Procedures Procedures (including critical care time)  Medications Ordered in ED Medications  sodium chloride 0.9 % bolus 1,000 mL (not administered)  HYDROmorphone (DILAUDID) injection 2 mg (not administered)  diphenhydrAMINE (BENADRYL) injection 25 mg (not administered)  ondansetron (ZOFRAN) injection 4 mg (not administered)     Initial Impression / Assessment and Plan / ED Course  I have reviewed the triage vital signs and the nursing notes.  Pertinent labs & imaging results that were available during my care of the patient were reviewed by me and considered in my medical decision making (see chart for details).  Clinical Course    Patient is overall well appearing. Vital signs are stable. Mucous membranes are moist. Patient has frequent ED visits for sickle cell pain. She appears to be at her neurological baseline.   Hypokalemia noted.  Po K+ given here as well as Rx for potassium.  Pt agrees to have K+ rechecked at her upcoming clinic visit.  Feeling better after IVF's and pain medications.  Agrees to f/u appt with sickle cell clinic next week.    Final Clinical Impressions(s) / ED Diagnoses   Final diagnoses:  Sickle cell crisis ([place])  Hypokalemia    New Prescriptions Discharge Medication List as of 08/22/2016 12:09 PM    START taking these medications   Details  potassium chloride SA (K-DUR,KLOR-CON) 20 MEQ tablet Take 2  tablets (40 mEq total) by mouth daily., Starting Wed 08/22/2016, Print         Kem Parkinson, PA-C 08/24/16 [place],  MD 09/03/16 [phone]

## 2016-08-22 NOTE — ED Notes (Signed)
Pt not able to void at this time.  Pt says she will try shortly.

## 2016-08-24 NOTE — Progress Notes (Signed)
aPPT CANCELLED

## 2016-09-05 ENCOUNTER — Telehealth (HOSPITAL_COMMUNITY): Payer: Self-pay | Admitting: *Deleted

## 2016-09-05 ENCOUNTER — Encounter (HOSPITAL_COMMUNITY): Payer: Self-pay | Admitting: *Deleted

## 2016-09-05 ENCOUNTER — Non-Acute Institutional Stay (HOSPITAL_COMMUNITY)
Admission: AD | Admit: 2016-09-05 | Discharge: 2016-09-05 | Disposition: A | Discharge status: Home / Self Care | Payer: Medicare Other | Source: Ambulatory Visit | Attending: Internal Medicine | Admitting: Internal Medicine

## 2016-09-05 DIAGNOSIS — Z79891 Long term (current) use of opiate analgesic: Secondary | ICD-10-CM | POA: Diagnosis not present

## 2016-09-05 DIAGNOSIS — D57219 Sickle-cell/Hb-C disease with crisis, unspecified: Secondary | ICD-10-CM | POA: Diagnosis not present

## 2016-09-05 DIAGNOSIS — Z79899 Other long term (current) drug therapy: Secondary | ICD-10-CM | POA: Insufficient documentation

## 2016-09-05 DIAGNOSIS — M549 Dorsalgia, unspecified: Secondary | ICD-10-CM | POA: Diagnosis present

## 2016-09-05 LAB — CBC WITH DIFFERENTIAL/PLATELET
Basophils Absolute: 0 10*3/uL (ref 0.0–0.1)
Basophils Relative: 0 %
EOS PCT: 1 %
Eosinophils Absolute: 0.1 10*3/uL (ref 0.0–0.7)
HCT: 23.3 % — ABNORMAL LOW (ref 36.0–46.0)
Hemoglobin: 8.7 g/dL — ABNORMAL LOW (ref 12.0–15.0)
LYMPHS PCT: 27 %
Lymphs Abs: 2 10*3/uL (ref 0.7–4.0)
MCH: 36.3 pg — ABNORMAL HIGH (ref 26.0–34.0)
MCHC: 37.3 g/dL — ABNORMAL HIGH (ref 30.0–36.0)
MCV: 97.1 fL (ref 78.0–100.0)
MONO ABS: 1.1 10*3/uL — AB (ref 0.1–1.0)
Monocytes Relative: 15 %
NEUTROS PCT: 57 %
Neutro Abs: 4.2 10*3/uL (ref 1.7–7.7)
PLATELETS: 214 10*3/uL (ref 150–400)
RBC: 2.4 MIL/uL — AB (ref 3.87–5.11)
RDW: 20.6 % — ABNORMAL HIGH (ref 11.5–15.5)
WBC: 7.4 10*3/uL (ref 4.0–10.5)

## 2016-09-05 LAB — COMPREHENSIVE METABOLIC PANEL
ALT: 36 U/L (ref 14–54)
ANION GAP: 5 (ref 5–15)
AST: 99 U/L — ABNORMAL HIGH (ref 15–41)
Albumin: 2.8 g/dL — ABNORMAL LOW (ref 3.5–5.0)
Alkaline Phosphatase: 153 U/L — ABNORMAL HIGH (ref 38–126)
BUN: 6 mg/dL (ref 6–20)
CHLORIDE: 107 mmol/L (ref 101–111)
CO2: 25 mmol/L (ref 22–32)
CREATININE: 0.54 mg/dL (ref 0.44–1.00)
Calcium: 8.5 mg/dL — ABNORMAL LOW (ref 8.9–10.3)
Glucose, Bld: 93 mg/dL (ref 65–99)
POTASSIUM: 3.6 mmol/L (ref 3.5–5.1)
Sodium: 137 mmol/L (ref 135–145)
Total Bilirubin: 13.4 mg/dL — ABNORMAL HIGH (ref 0.3–1.2)
Total Protein: 8.1 g/dL (ref 6.5–8.1)

## 2016-09-05 LAB — RETICULOCYTES
RBC.: 2.4 MIL/uL — ABNORMAL LOW (ref 3.87–5.11)
RETIC COUNT ABSOLUTE: 475.2 10*3/uL — AB (ref 19.0–186.0)
RETIC CT PCT: 19.8 % — AB (ref 0.4–3.1)

## 2016-09-05 MED ORDER — DIPHENHYDRAMINE HCL 25 MG PO CAPS
25.0000 mg | ORAL_CAPSULE | ORAL | Status: DC | PRN
  Administered 2016-09-05: 25 mg via ORAL
  Filled 2016-09-05: qty 1

## 2016-09-05 MED ORDER — HYDROMORPHONE 1 MG/ML IV SOLN
INTRAVENOUS | Status: DC
  Administered 2016-09-05: 10:00:00 via INTRAVENOUS
  Administered 2016-09-05: 6 mg via INTRAVENOUS
  Filled 2016-09-05: qty 25

## 2016-09-05 MED ORDER — SODIUM CHLORIDE 0.9% FLUSH
10.0000 mL | INTRAVENOUS | Status: AC | PRN
  Administered 2016-09-05: 10 mL

## 2016-09-05 MED ORDER — KETOROLAC TROMETHAMINE 30 MG/ML IJ SOLN
30.0000 mg | Freq: Once | INTRAMUSCULAR | Status: AC
  Administered 2016-09-05: 30 mg via INTRAVENOUS
  Filled 2016-09-05: qty 1

## 2016-09-05 MED ORDER — DIPHENHYDRAMINE HCL 50 MG/ML IJ SOLN
25.0000 mg | INTRAMUSCULAR | Status: DC | PRN
  Filled 2016-09-05: qty 0.5

## 2016-09-05 MED ORDER — NALOXONE HCL 0.4 MG/ML IJ SOLN: 0.4000 mg | INTRAMUSCULAR | Status: DC | PRN

## 2016-09-05 MED ORDER — ONDANSETRON HCL 4 MG/2ML IJ SOLN
4.0000 mg | Freq: Four times a day (QID) | INTRAMUSCULAR | Status: DC | PRN
  Administered 2016-09-05: 4 mg via INTRAVENOUS
  Filled 2016-09-05: qty 2

## 2016-09-05 MED ORDER — SODIUM CHLORIDE 0.9% FLUSH: 9.0000 mL | INTRAVENOUS | Status: DC | PRN

## 2016-09-05 MED ORDER — HEPARIN SOD (PORK) LOCK FLUSH 100 UNIT/ML IV SOLN
500.0000 [IU] | INTRAVENOUS | Status: AC | PRN
  Administered 2016-09-05: 500 [IU]
  Filled 2016-09-05: qty 5

## 2016-09-05 MED ORDER — DEXTROSE-NACL 5-0.45 % IV SOLN
INTRAVENOUS | Status: DC
  Administered 2016-09-05: 10:00:00 via INTRAVENOUS

## 2016-09-05 NOTE — Discharge Summary (Signed)
[facility] Discharge Summary   Patient ID: Dawn [name] MRN: [id number] DOB/AGE: 05/22/1981 35 y.o.  Admit date: 09/05/2016 Discharge date: 09/05/2016  Primary Care Physician:  Angelica Chessman, MD  Admission Diagnoses:  Active Problems:   Sickle-cell/Hb-C disease with crisis [facility])  Discharge Medications: No current facility-administered medications on file prior to encounter.    Current Outpatient Prescriptions on File Prior to Encounter  Medication Sig Dispense Refill  . folic acid (FOLVITE) 1 MG tablet Take 1 tablet (1 mg total) by mouth daily. 30 tablet 11  . hydroxyurea (HYDREA) 500 MG capsule Take 2 capsules (1,000 mg total) by mouth daily. May take with food to minimize GI side effects. 60 capsule 11  . loratadine (CLARITIN) 10 MG tablet Take 10 mg by mouth daily.    Marland Kitchen morphine (MS CONTIN) 15 MG 12 hr tablet Take 15 mg by mouth every 12 (twelve) hours.    . Multiple Vitamin (MULTIVITAMIN WITH MINERALS) TABS tablet Take 1 tablet by mouth daily.    . Oxycodone HCl 10 MG TABS 1 tablet 4 (four) times daily as needed.  0  . promethazine (PHENERGAN) 25 MG tablet Take 1 tablet (25 mg total) by mouth every 6 (six) hours as needed for nausea or vomiting. 30 tablet 3  . Vitamin D, Ergocalciferol, (DRISDOL) 50000 units CAPS capsule Take 1 capsule (50,000 Units total) by mouth every 7 (seven) days. Takes on Sundays. 4 capsule 11  . levonorgestrel (MIRENA) 20 MCG/24HR IUD 1 each by Intrauterine route once.    . potassium chloride SA (K-DUR,KLOR-CON) 20 MEQ tablet Take 2 tablets (40 mEq total) by mouth daily. 14 tablet 0      Consults:  None  Significant Diagnostic Studies:  No results found.   [facility] Course: Dawn [name], a 35 year old female with a female of sickle cell anemia was admitted to the day infusion center for pain management.  Reviewed labs, hemodynamically stable. Patient was started on high concentration PCA dilaudid. She used a  total of 6 mg with 12 demands and 12 deliveries. Pain intensity decreased from 10/10 to 3/10. Patient is alert, oriented, and ambulatory.  Recommend continue medications as previously prescribed. Increase water intake to 64 ounces per day. Follow up in clinic on 09/07/2016 as previously scheduled.  Patient will discharge home with family in stable condition.  Physical Exam at Discharge:   BP (!) 134/91 (BP Location: Right Arm)   Pulse 84   Temp 98.7 F (37.1 C) (Oral)   Resp 13   Ht 5\' 3"  (1.6 m)   Wt 167 lb (75.8 kg)   SpO2 94%   BMI 29.58 kg/m   General Appearance:    Alert, cooperative, no distress, appears stated age  Head:    Normocephalic, without obvious abnormality, atraumatic  Back:     Symmetric, no curvature, ROM normal, no CVA tenderness  Lungs:     Clear to auscultation bilaterally, respirations unlabored  Chest Wall:    No tenderness or deformity   Heart:    Regular rate and rhythm, S1 and S2 normal, no murmur, rub   or gallop  Abdomen:     Soft, non-tender, bowel sounds active all four quadrants,    no masses, no organomegaly  Extremities:   Extremities normal, atraumatic, no cyanosis or edema  Pulses:   2+ and symmetric all extremities  Skin:   Skin color, texture, turgor normal, no rashes or lesions  Lymph nodes:  Cervical, supraclavicular, and axillary nodes normal  Neurologic:   CNII-XII intact, normal strength, sensation and reflexes    throughout    Disposition at Discharge: 01-Home or Self Care  Discharge Orders: Discharge Instructions    Discharge patient    Complete by:  As directed       Condition at Discharge:   Stable  Time spent on Discharge:  15 minutes  Signed: Ajahni Nay M 09/05/2016, 2:27 PM

## 2016-09-05 NOTE — Telephone Encounter (Signed)
Called patient back after speaking with the provider that she could come in for treatment. Pt states she was already on the way and would be here soon.

## 2016-09-05 NOTE — Discharge Instructions (Signed)
Sickle Cell Anemia, Adult °Sickle cell anemia is a condition where your red blood cells are shaped like sickles. Red blood cells carry oxygen through the body. Sickle-shaped red blood cells do not live as long as normal red blood cells. They also clump together and block blood from flowing through the blood vessels. These things prevent the body from getting enough oxygen. Sickle cell anemia causes organ damage and pain. It also increases the risk of infection. °Follow these instructions at home: °· Drink enough fluid to keep your pee (urine) clear or pale yellow. Drink more in hot weather and during exercise. °· Do not smoke. Smoking lowers oxygen levels in the blood. °· Only take over-the-counter or prescription medicines as told by your doctor. °· Take antibiotic medicines as told by your doctor. Make sure you finish them even if you start to feel better. °· Take supplements as told by your doctor. °· Consider wearing a medical alert bracelet. This tells anyone caring for you in an emergency of your condition. °· When traveling, keep your medical information, doctors' names, and the medicines you take with you at all times. °· If you have a fever, do not take fever medicines right away. This could cover up a problem. Tell your doctor. °· Keep all follow-up visits with your doctor. Sickle cell anemia requires regular medical care. °Contact a doctor if: °You have a fever. °Get help right away if: °· You feel dizzy or faint. °· You have new belly (abdominal) pain, especially on the left side near the stomach area. °· You have a lasting, often uncomfortable and painful erection of the penis (priapism). If it is not treated right away, you will become unable to have sex (impotence). °· You have numbness in your arms or legs or you have a hard time moving them. °· You have a hard time talking. °· You have a fever or lasting symptoms for more than 2-3 days. °· You have a fever and your symptoms suddenly get  worse. °· You have signs or symptoms of infection. These include: °? Chills. °? Being more tired than normal (lethargy). °? Irritability. °? Poor eating. °? Throwing up (vomiting). °· You have pain that is not helped with medicine. °· You have shortness of breath. °· You have pain in your chest. °· You are coughing up pus-like or bloody mucus. °· You have a stiff neck. °· Your feet or hands swell or have pain. °· Your belly looks bloated. °· Your joints hurt. °This information is not intended to replace advice given to you by your health care provider. Make sure you discuss any questions you have with your health care provider. °Document Released: 07/01/2013 Document Revised: 02/16/2016 Document Reviewed: 04/22/2013 °Elsevier Interactive Patient Education © 2017 [employer] ° °

## 2016-09-05 NOTE — Telephone Encounter (Signed)
Pt called requesting to come to the the [facility] for treatment. Pt states her pain is 8/10 and that it is in her back and legs. She took her last pain med of MS contin and Oxycodone at 7am. She has been taking her meds as prescribed. She has had this pain for the last 3 days. Will check with the provider and give her a call back. Pt voiced understanding.

## 2016-09-05 NOTE — Progress Notes (Signed)
Pt received to the [facility] for treatment. Pt states her pain is 8/10 and that it is in her back and legs. She was treated with IV fluids, Dilaudid PCA and Toradol. Pt's pain # is 4/10 at discharge. Pt was given discharge instructions with verbal understanding. Pt was alert, oriented and ambulatory at discharge. She is discharged to home with her sister.

## 2016-09-05 NOTE — H&P (Signed)
[facility] History and Physical   Date: 09/05/2016  Patient name: Dawn [name] Medical record number: [id number] Date of birth: [birth year] Age: 35 y.o. Gender: female PCP: Angelica Chessman, MD  Attending physician: Tresa Garter, MD  Chief Complaint: Back and lower extremity pain History of Present Illness: Dawn [name], a 35 year old female with a history of sickle cell anemia, HbSS presents complaining of pain to back and lower extremities that is consistent with sickle cell pain. She says that pain started 3 days ago. She says that pain intensity has been increasing over the past several days. Pain intensity is 10/10 described as constant and throbbing. She last had Oxycodone around 7 am. Dawn [name] has been taking pain medications as prescribed without sustained relief. She currently denies headache, blurred vision, shortness of breath, dysuria, nausea, vomiting, or diarrhea.   Meds: Prescriptions Prior to Admission  Medication Sig Dispense Refill Last Dose  . folic acid (FOLVITE) 1 MG tablet Take 1 tablet (1 mg total) by mouth daily. 30 tablet 11 08/22/2016 at Unknown time  . hydroxyurea (HYDREA) 500 MG capsule Take 2 capsules (1,000 mg total) by mouth daily. May take with food to minimize GI side effects. 60 capsule 11 08/22/2016 at Unknown time  . levonorgestrel (MIRENA) 20 MCG/24HR IUD 1 each by Intrauterine route once.   08/22/2016 at Unknown time  . loratadine (CLARITIN) 10 MG tablet Take 10 mg by mouth daily.   08/22/2016 at Unknown time  . morphine (MS CONTIN) 15 MG 12 hr tablet Take 15 mg by mouth every 12 (twelve) hours.   08/22/2016 at Unknown time  . Multiple Vitamin (MULTIVITAMIN WITH MINERALS) TABS tablet Take 1 tablet by mouth daily.   08/22/2016 at Unknown time  . Oxycodone HCl 10 MG TABS 1 tablet 4 (four) times daily as needed.  0 08/22/2016 at Unknown time  . potassium chloride SA (K-DUR,KLOR-CON) 20 MEQ tablet Take 2 tablets (40 mEq total) by mouth  daily. 14 tablet 0   . promethazine (PHENERGAN) 25 MG tablet Take 1 tablet (25 mg total) by mouth every 6 (six) hours as needed for nausea or vomiting. 30 tablet 3 08/21/2016 at Unknown time  . Vitamin D, Ergocalciferol, (DRISDOL) 50000 units CAPS capsule Take 1 capsule (50,000 Units total) by mouth every 7 (seven) days. Takes on Sundays. 4 capsule 11 08/19/2016    Allergies: Desferal [deferoxamine]; Latex; Lisinopril; Tape; and Tylenol [acetaminophen] Past Medical History:  Diagnosis Date  . Blood transfusion   . Demand ischemia ([place]) 02/06/2016  . HCAP (healthcare-associated pneumonia) 05/19/2013  . 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 Pulmonary hypertension 02/06/2016   49 mmHg per echo on 02/06/16   . 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 Sickle cell anemia (HCC)   . Sickle cell disease ([place])   . 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 Vitamin B12 deficiency 07/07/2015   Past Surgical History:  Procedure Laterality Date  .  left knee ACL reconstruction    . CESAREAN SECTION     x 2  . CHOLECYSTECTOMY    . IR GENERIC HISTORICAL  07/17/2016   IR FLUORO GUIDE PORT INSERTION RIGHT 07/17/2016 Aletta Edouard, MD [facility]  . IR GENERIC HISTORICAL  07/17/2016   IR US GUIDE VASC ACCESS RIGHT 07/17/2016 Aletta Edouard, MD [facility]  . PORT-A-CATH REMOVAL Left 07/29/2013   Procedure: REMOVAL PORT-A-CATH;  Surgeon: Scherry Ran, MD;  Location: [facility];  Service: General;  Laterality: Left;  .  PORTACATH PLACEMENT    . PORTACATH PLACEMENT Left 02/23/2013   Procedure: INSERTION PORT-A-CATH;  Surgeon: Donato Heinz, MD;  Location: [facility] OR[facility];  Service: General;  Laterality: Left;  . TEE WITHOUT CARDIOVERSION N/A 08/07/2013   Procedure: TRANSESOPHAGEAL ECHOCARDIOGRAM (TEE);  Surgeon: Arnoldo Lenis, MD;  Location: [facility];  Service: Cardiology;  Laterality: N/A;   Family History  Problem Relation Age of Onset  . Sickle cell trait Mother   . Sickle cell trait Father    . Hypertension Father   . Diabetes Father   . Sickle cell trait Sister   . Cancer Paternal Aunt     Breast   Social History   Social History  . Marital status: Divorced    Spouse name: N/A  . Number of children: N/A  . Years of education: N/A   Occupational History  . Not on file.   Social History Main Topics  . Smoking status: Never Smoker  . Smokeless tobacco: Never Used  . Alcohol use No  . Drug use: No  . Sexual activity: Yes    Birth control/ protection: IUD   Other Topics Concern  . Not on file   Social History Narrative  . No narrative on file    Review of Systems: Constitutional: negative for fatigue, fevers and malaise Eyes: positive for icterus Ears, nose, mouth, throat, and face: negative Respiratory: negative for dyspnea on exertion Cardiovascular: negative for chest pain, dyspnea, lower extremity edema and palpitations Gastrointestinal: negative for nausea and vomiting Genitourinary:negative Integument/breast: negative Hematologic/lymphatic: negative Musculoskeletal:positive for back pain and myalgias Neurological: negative Behavioral/Psych: negative Endocrine: negative Allergic/Immunologic: negative  Physical Exam: There were no vitals taken for this visit. BP 130/84 (BP Location: Right Arm)   Pulse 83   Temp 98.7 F (37.1 C) (Oral)   Resp 13   Ht 5\' 3"  (1.6 m)   Wt 167 lb (75.8 kg)   SpO2 96%   BMI 29.58 kg/m   General Appearance:    Alert, cooperative, no distress, appears stated age  Head:    Normocephalic, without obvious abnormality, atraumatic  Eyes:    PERRL, conjunctiva/corneas clear, EOM's intact, fundi    benign, icterus both eyes  Ears:    Normal TM's and external ear canals, both ears  Nose:   Nares normal, septum midline, mucosa normal, no drainage    or sinus tenderness  Throat:   Lips, mucosa, and tongue normal; teeth and gums normal  Neck:   Supple, symmetrical, trachea midline, no adenopathy;    thyroid:  no  enlargement/tenderness/nodules; no carotid   bruit or JVD  Back:     Symmetric, no curvature, ROM normal, no CVA tenderness  Lungs:     Clear to auscultation bilaterally, respirations unlabored  Chest Wall:    No tenderness or deformity   Heart:    Regular rate and rhythm, S1 and S2 normal, no murmur, rub   or gallop  Abdomen:     Soft, non-tender, bowel sounds active all four quadrants,    no masses, no organomegaly  Extremities:   Extremities normal, atraumatic, no cyanosis or edema  Pulses:   2+ and symmetric all extremities  Skin:   Skin color, texture, turgor normal, no rashes or lesions  Lymph nodes:   Cervical, supraclavicular, and axillary nodes normal  Neurologic:   CNII-XII intact, normal strength, sensation and reflexes    throughout     Lab results:No results found for this or any previous visit (from the  past 24 hour(s)).  Imaging results:  No results found.   Assessment & Plan:  Patient will be admitted to the day infusion center for extended observation  Start IV D5.45 for cellular rehydration at 150/hr  Start Toradol 30 mg IV every 6 hours for inflammation.  Start Dilaudid PCA High Concentration per weight based protocol.   Patient will be re-evaluated for pain intensity in the context of function and relationship to baseline as care progresses.  If no significant pain relief, will transfer patient to inpatient services for a higher level of care.   Will check CMP,  Reticulocyte count and CBC w/differential   Hollis,Lachina M 09/05/2016, 9:24 AM

## 2016-09-05 NOTE — Progress Notes (Addendum)
No content

## 2016-09-07 ENCOUNTER — Encounter (HOSPITAL_COMMUNITY): Payer: Self-pay | Admitting: *Deleted

## 2016-09-07 ENCOUNTER — Encounter: Payer: Self-pay | Admitting: Family Medicine

## 2016-09-07 ENCOUNTER — Non-Acute Institutional Stay (HOSPITAL_COMMUNITY)
Admission: AD | Admit: 2016-09-07 | Discharge: 2016-09-07 | Disposition: A | Discharge status: Home / Self Care | Payer: Medicare Other | Source: Ambulatory Visit | Attending: Internal Medicine | Admitting: Internal Medicine

## 2016-09-07 ENCOUNTER — Ambulatory Visit (INDEPENDENT_AMBULATORY_CARE_PROVIDER_SITE_OTHER): Payer: Medicare Other | Admitting: Family Medicine

## 2016-09-07 VITALS — BP 135/69 | HR 97 | Temp 98.4°F | Resp 18 | Ht 63.5 in | Wt 172.0 lb

## 2016-09-07 DIAGNOSIS — M549 Dorsalgia, unspecified: Secondary | ICD-10-CM | POA: Diagnosis present

## 2016-09-07 DIAGNOSIS — D5700 Hb-SS disease with crisis, unspecified: Secondary | ICD-10-CM

## 2016-09-07 DIAGNOSIS — Z79899 Other long term (current) drug therapy: Secondary | ICD-10-CM | POA: Diagnosis not present

## 2016-09-07 DIAGNOSIS — Z79891 Long term (current) use of opiate analgesic: Secondary | ICD-10-CM | POA: Insufficient documentation

## 2016-09-07 LAB — POCT URINALYSIS DIP (DEVICE)
GLUCOSE, UA: NEGATIVE mg/dL
KETONES UR: NEGATIVE mg/dL
LEUKOCYTES UA: NEGATIVE
Nitrite: NEGATIVE
Protein, ur: >=300 mg/dL — AB
SPECIFIC GRAVITY, URINE: 1.015 (ref 1.005–1.030)
Urobilinogen, UA: >=8 mg/dL (ref 0.0–1.0)
pH: 6.5 (ref 5.0–8.0)

## 2016-09-07 MED ORDER — DIPHENHYDRAMINE HCL 25 MG PO CAPS
25.0000 mg | ORAL_CAPSULE | Freq: Once | ORAL | Status: AC
  Administered 2016-09-07: 25 mg via ORAL
  Filled 2016-09-07: qty 1

## 2016-09-07 MED ORDER — SODIUM CHLORIDE 0.9% FLUSH
10.0000 mL | INTRAVENOUS | Status: AC | PRN
  Administered 2016-09-07: 10 mL

## 2016-09-07 MED ORDER — HEPARIN SOD (PORK) LOCK FLUSH 100 UNIT/ML IV SOLN
500.0000 [IU] | INTRAVENOUS | Status: AC | PRN
  Administered 2016-09-07: 500 [IU]
  Filled 2016-09-07: qty 5

## 2016-09-07 MED ORDER — HYDROMORPHONE HCL 2 MG/ML IJ SOLN
1.5000 mg | Freq: Once | INTRAMUSCULAR | Status: AC
Start: 2016-09-07 — End: 2016-09-07
  Administered 2016-09-07: 1.5 mg via INTRAVENOUS
  Filled 2016-09-07: qty 1

## 2016-09-07 MED ORDER — KETOROLAC TROMETHAMINE 30 MG/ML IJ SOLN
30.0000 mg | Freq: Once | INTRAMUSCULAR | Status: AC
  Administered 2016-09-07: 30 mg via INTRAVENOUS
  Filled 2016-09-07: qty 1

## 2016-09-07 MED ORDER — HYDROMORPHONE HCL 2 MG/ML IJ SOLN
1.0000 mg | Freq: Once | INTRAMUSCULAR | Status: AC
  Administered 2016-09-07: 1 mg via INTRAVENOUS
  Filled 2016-09-07: qty 1

## 2016-09-07 MED ORDER — HYDROMORPHONE HCL 2 MG/ML IJ SOLN
2.0000 mg | Freq: Once | INTRAMUSCULAR | Status: AC
Start: 2016-09-07 — End: 2016-09-07
  Administered 2016-09-07: 2 mg via INTRAVENOUS
  Filled 2016-09-07: qty 1

## 2016-09-07 MED ORDER — DEXTROSE 5 % AND 0.45 % NACL IV BOLUS
250.0000 mL | Freq: Once | INTRAVENOUS | Status: AC
  Administered 2016-09-07: 250 mL via INTRAVENOUS

## 2016-09-07 MED ORDER — HYDROMORPHONE HCL 2 MG/ML IJ SOLN
2.0000 mg | Freq: Once | INTRAMUSCULAR | Status: AC
  Administered 2016-09-07: 2 mg via INTRAVENOUS
  Filled 2016-09-07: qty 1

## 2016-09-07 MED ORDER — ONDANSETRON HCL 4 MG/2ML IJ SOLN
4.0000 mg | Freq: Once | INTRAMUSCULAR | Status: AC
  Administered 2016-09-07: 4 mg via INTRAVENOUS
  Filled 2016-09-07: qty 2

## 2016-09-07 NOTE — Progress Notes (Signed)
Patient ID: Dawn [name], female   DOB: 1981/09/03, 35 y.o.   MRN: [id number] Admitted to [facility] for complaints of back pain 8/10 pain intensity. Treated with IV fluid bolus of 250 ml, Toradol IV, Dilaudid IV pushes. Pain level down to 4/10 at time of discharge. Went over discharge instructions and copy given to patient. Discharged to home with a ride. Alert, oriented and ambulatory at time of discharge.

## 2016-09-07 NOTE — H&P (Signed)
[facility] History and Physical   Date: 09/07/2016  Patient name: Dawn [name] Medical record number: [id number] Date of birth: 05-06-1981 Age: 35 y.o. Gender: female PCP: Angelica Chessman, MD  Attending physician: Tresa Garter, MD  Chief Complaint: Back and lower extremity pain History of Present Illness: Dawn [name], a 35 year old female with a history of sickle cell anemia, HbSS presents complaining of pain to back and lower extremities that is consistent with sickle cell pain. She says that pain started 1 week  ago. She says that pain intensity has been increasing over the past several days. Pain intensity is 8/10 described as constant and throbbing. She last had Oxycodone around 6-7 am. Dawn [name] has been taking pain medications as prescribed without sustained relief. She currently denies headache, blurred vision, shortness of breath, dysuria, nausea, vomiting, or diarrhea. Patient transitioned from primary care office.   Meds: Prescriptions Prior to Admission  Medication Sig Dispense Refill Last Dose  . folic acid (FOLVITE) 1 MG tablet Take 1 tablet (1 mg total) by mouth daily. 30 tablet 11 Taking  . hydroxyurea (HYDREA) 500 MG capsule Take 2 capsules (1,000 mg total) by mouth daily. May take with food to minimize GI side effects. 60 capsule 11 Taking  . levonorgestrel (MIRENA) 20 MCG/24HR IUD 1 each by Intrauterine route once.   Taking  . loratadine (CLARITIN) 10 MG tablet Take 10 mg by mouth daily.   Taking  . morphine (MS CONTIN) 15 MG 12 hr tablet Take 15 mg by mouth every 12 (twelve) hours.   Taking  . Multiple Vitamin (MULTIVITAMIN WITH MINERALS) TABS tablet Take 1 tablet by mouth daily.   Taking  . Oxycodone HCl 10 MG TABS 1 tablet 4 (four) times daily as needed.  0 Taking  . potassium chloride SA (K-DUR,KLOR-CON) 20 MEQ tablet Take 2 tablets (40 mEq total) by mouth daily. 14 tablet 0 Taking  . promethazine (PHENERGAN) 25 MG tablet Take 1 tablet (25 mg  total) by mouth every 6 (six) hours as needed for nausea or vomiting. 30 tablet 3 Taking  . Vitamin D, Ergocalciferol, (DRISDOL) 50000 units CAPS capsule Take 1 capsule (50,000 Units total) by mouth every 7 (seven) days. Takes on Sundays. 4 capsule 11 Taking    Allergies: Desferal [deferoxamine]; Latex; Lisinopril; Tape; and Tylenol [acetaminophen] Past Medical History:  Diagnosis Date  . Blood transfusion   . Demand ischemia ([place]) 02/06/2016  . HCAP (healthcare-associated pneumonia) 05/19/2013  . 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 Pulmonary hypertension 02/06/2016   49 mmHg per echo on 02/06/16   . 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 Sickle cell anemia (HCC)   . Sickle cell disease ([place])   . 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 Vitamin B12 deficiency 07/07/2015   Past Surgical History:  Procedure Laterality Date  .  left knee ACL reconstruction    . CESAREAN SECTION     x 2  . CHOLECYSTECTOMY    . IR GENERIC HISTORICAL  07/17/2016   IR FLUORO GUIDE PORT INSERTION RIGHT 07/17/2016 Aletta Edouard, MD [facility]  . IR GENERIC HISTORICAL  07/17/2016   IR US GUIDE VASC ACCESS RIGHT 07/17/2016 Aletta Edouard, MD [facility]  . PORT-A-CATH REMOVAL Left 07/29/2013   Procedure: REMOVAL PORT-A-CATH;  Surgeon: Scherry Ran, MD;  Location: [facility];  Service: General;  Laterality: Left;  . PORTACATH PLACEMENT    . PORTACATH PLACEMENT Left 02/23/2013   Procedure: INSERTION PORT-A-CATH;  Surgeon: Donato Heinz, MD;  Location: [facility];  Service: General;  Laterality: Left;  . TEE WITHOUT CARDIOVERSION N/A 08/07/2013   Procedure: TRANSESOPHAGEAL ECHOCARDIOGRAM (TEE);  Surgeon: Arnoldo Lenis, MD;  Location: [facility];  Service: Cardiology;  Laterality: N/A;   Family History  Problem Relation Age of Onset  . Sickle cell trait Mother   . Sickle cell trait Father   . Hypertension Father   . Diabetes Father   . Sickle cell trait Sister   . Cancer Paternal Aunt      Breast   Social History   Social History  . Marital status: Divorced    Spouse name: N/A  . Number of children: N/A  . Years of education: N/A   Occupational History  . Not on file.   Social History Main Topics  . Smoking status: Never Smoker  . Smokeless tobacco: Never Used  . Alcohol use No  . Drug use: No  . Sexual activity: Yes    Birth control/ protection: IUD   Other Topics Concern  . Not on file   Social History Narrative  . No narrative on file    Review of Systems: Constitutional: negative for fatigue, fevers and malaise Eyes: positive for icterus Ears, nose, mouth, throat, and face: negative Respiratory: negative for dyspnea on exertion Cardiovascular: negative for chest pain, dyspnea, lower extremity edema and palpitations Gastrointestinal: negative for nausea and vomiting Genitourinary:negative Integument/breast: negative Hematologic/lymphatic: negative Musculoskeletal:positive for back pain and myalgias Neurological: negative Behavioral/Psych: negative Endocrine: negative Allergic/Immunologic: negative  Physical Exam: Blood pressure 139/84, pulse 90, temperature 98.7 F (37.1 C), temperature source Oral, resp. rate 18, height 5\' 3"  (1.6 m), weight 172 lb (78 kg), SpO2 94 %. BP 139/84 (BP Location: Right Arm, Patient Position: Sitting)   Pulse 90   Temp 98.7 F (37.1 C) (Oral)   Resp 18   Ht 5\' 3"  (1.6 m)   Wt 172 lb (78 kg)   SpO2 94%   BMI 30.47 kg/m   General Appearance:    Alert, cooperative, no distress, appears stated age  Head:    Normocephalic, without obvious abnormality, atraumatic  Eyes:    PERRL, conjunctiva/corneas clear, EOM's intact, fundi    benign, icterus both eyes  Ears:    Normal TM's and external ear canals, both ears  Nose:   Nares normal, septum midline, mucosa normal, no drainage    or sinus tenderness  Throat:   Lips, mucosa, and tongue normal; teeth and gums normal  Neck:   Supple, symmetrical, trachea midline, no  adenopathy;    thyroid:  no enlargement/tenderness/nodules; no carotid   bruit or JVD  Back:     Symmetric, no curvature, ROM normal, no CVA tenderness  Lungs:     Clear to auscultation bilaterally, respirations unlabored  Chest Wall:    No tenderness or deformity   Heart:    Regular rate and rhythm, S1 and S2 normal, no murmur, rub   or gallop  Abdomen:     Soft, non-tender, bowel sounds active all four quadrants,    no masses, no organomegaly  Extremities:   Extremities normal, atraumatic, no cyanosis or edema  Pulses:   2+ and symmetric all extremities  Skin:   Skin color, texture, turgor normal, no rashes or lesions  Lymph nodes:   Cervical, supraclavicular, and axillary nodes normal  Neurologic:   CNII-XII intact, normal strength, sensation and reflexes    throughout     Lab results: Results for  orders placed or performed in visit on 09/07/16 (from the past 24 hour(s))  POCT urinalysis dip (device)     Status: Abnormal   Collection Time: 09/07/16 10:04 AM  Result Value Ref Range   Glucose, UA NEGATIVE NEGATIVE mg/dL   Bilirubin Urine LARGE (A) NEGATIVE   Ketones, ur NEGATIVE NEGATIVE mg/dL   Specific Gravity, Urine 1.015 1.005 - 1.030   Hgb urine dipstick LARGE (A) NEGATIVE   pH 6.5 5.0 - 8.0   Protein, ur >=300 (A) NEGATIVE mg/dL   Urobilinogen, UA >=8.0 0.0 - 1.0 mg/dL   Nitrite NEGATIVE NEGATIVE   Leukocytes, UA NEGATIVE NEGATIVE    Imaging results:  No results found.   Assessment & Plan:  Patient will be admitted to the day infusion center for extended observation  Start IV D5.45 for cellular rehydration at 250 ml bolus  Start Toradol 30 mg IV times one  Start Dilaudid per weight based rapid redosing  Patient will be re-evaluated for pain intensity in the context of function and relationship to baseline as care progresses.  If no significant pain relief, will transfer patient to inpatient services for a higher level of care.   Reviewed previous  labs   Hafsa Lohn M 09/07/2016, 10:39 AM

## 2016-09-07 NOTE — Progress Notes (Signed)
Subjective:    Patient ID: Dawn [name], female    DOB: 23-Feb-1981, 35 y.o.   MRN: [id number]  HPI [name] is a 35 year old female with a history of sickle cell anemia, HbSS presents for a follow-up. She states that pain has not been controlled since treatment in the day infusion center on 09/04/2016. Current pain intensity is 8/10 primarily to back and legs.   She maintains that she has been taking all medications consistently.  She states that pain is not controlled on MS Contin 15 mg every 12 hours. Dawn [name] states that she only uses Oxycodone 10 mg for breakthrough pain.  She denies fever, chest pains, dysuria,  nausea, vomiting, or diarrhea.  Past Medical History:  Diagnosis Date  . Blood transfusion   . Demand ischemia (Dawn [name]) 02/06/2016  . HCAP (healthcare-associated pneumonia) 05/19/2013  . 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 Pulmonary hypertension 02/06/2016   49 mmHg per echo on 02/06/16   . 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 Sickle cell anemia (HCC)   . Sickle cell disease ([place])   . Sickle cell disease, type S ([place])   . Vitamin B12 deficiency 07/07/2015   Immunization History  Administered Date(s) Administered  . Influenza Split 07/07/2012  . Influenza,inj,Quad PF,36+ Mos 07/26/2013, 06/13/2014  . Pneumococcal Conjugate-13 12/16/2014  . Pneumococcal Polysaccharide-23 06/26/2010, 07/07/2012, 05/03/2016  . Tdap 08/24/2013   Social History   Social History  . Marital status: Divorced    Spouse name: N/A  . Number of children: N/A  . Years of education: N/A   Occupational History  . Not on file.   Social History Main Topics  . Smoking status: Never Smoker  . Smokeless tobacco: Never Used  . Alcohol use No  . Drug use: No  . Sexual activity: Yes    Birth control/ protection: IUD   Other Topics Concern  . Not on file   Social History Narrative  . No narrative on file    Review of Systems  Constitutional: Negative for fatigue, fever and unexpected  weight change.  HENT: Negative.   Eyes: Negative.  Negative for visual disturbance.  Respiratory: Negative.  Negative for shortness of breath.   Cardiovascular: Negative.   Gastrointestinal: Negative.   Endocrine: Negative.  Negative for polydipsia, polyphagia and polyuria.  Genitourinary: Negative.  Negative for difficulty urinating.  Musculoskeletal: Positive for back pain (Intermittently).  Skin: Negative.   Allergic/Immunologic: Positive for environmental allergies. Negative for immunocompromised state.  Neurological: Negative.   Hematological: Negative.   Psychiatric/Behavioral: Negative.        Objective:   Physical Exam  Constitutional: She is oriented to person, place, and time. She appears well-developed and well-nourished.  HENT:  Head: Normocephalic and atraumatic.  Right Ear: External ear normal.  Left Ear: External ear normal.  Mouth/Throat: Oropharynx is clear and moist.  Eyes: Conjunctivae and EOM are normal. Pupils are equal, round, and reactive to light.  Neck: Normal range of motion. Neck supple.  Cardiovascular: Normal rate, normal heart sounds and intact distal pulses.   Pulmonary/Chest: Effort normal and breath sounds normal.  Abdominal: Soft. Bowel sounds are normal.  Musculoskeletal: Normal range of motion.  Neurological: She is alert and oriented to person, place, and time. She has normal reflexes.  Skin: Skin is warm and dry.  Psychiatric: She has a normal mood and affect. Her behavior is normal. Judgment and thought content normal.      BP 135/69 (BP Location: Left Arm, Patient  Position: Sitting, Cuff Size: Large)   Pulse 97   Temp 98.4 F (36.9 C) (Oral)   Resp 18   Ht 5' 3.5" (1.613 m)   Wt 172 lb (78 kg)   SpO2 96%   BMI 29.99 kg/m   Assessment & Plan:   1. Hb-SS disease with crisis [facility]) Patient will transition to the day infusion center for a sickle cell crisis. Will administer bolus of IV fluids and IV dilaudid per weight based rapid  redosing protocol for pain management.  Continue Hydrea 1000 mg daily (15 mg/kg), will not consider increasing dosage at this time. Will review ANC, platelets, and hemoglobin, and reticulocytes as results become available. We discussed the need for good hydration, monitoring of hydration status, avoidance of heat, cold, stress, and infection triggers. We discussed the risks and benefits of Hydrea, including bone marrow suppression, the possibility of GI upset, skin ulcers, hair thinning, and teratogenicity. The patient was reminded of the need to seek medical attention of any symptoms of bleeding, anemia, or infection. Continue folic acid 1 mg daily to prevent aplastic bone marrow crises. Discussed frequency of sickle cell crises at length. Reviewed urinalysis, mild proteinuria, improved from previous urinalysis.    Pulmonary evaluation - Patient denies severe recurrent wheezes, shortness of breath with exercise, or persistent cough. If these symptoms develop, pulmonary function tests with spirometry will be ordered, and if abnormal, plan on referral to Pulmonology for further evaluation.  Cardiac - Routine screening for pulmonary hypertension is not recommended.  Eye - High risk of proliferative retinopathy. Annual eye exam with retinal exam recommended to patient. Patient recently referred to opthalmology   Immunization status - Dawn [name] is up to date with immunizations  Acute and chronic painful episodes - We agreed on starting MS Contin 15 mg every 12 hours for increased pain. Continue Oxycodone 10 mg every 6 hours as needed for moderate to severe pain. We discussed that pt is to receive her Schedule II prescriptions only from [place]. Pt is also aware that the prescription history is available to [place] online through the [facility] CSRS. Controlled substance agreement signed previously. We reminded Dawn [name] that all patients receiving Schedule II narcotics must be seen for follow within one month of prescription being  requested. We reviewed the terms of our pain agreement, including the need to keep medicines in a safe locked location away from children or pets, and the need to report excess sedation or constipation, measures to avoid constipation, and policies related to early refills and stolen prescriptions. According to the [place] Chronic [facility] program, we have reviewed details related to analgesia, adverse effects, aberrant behaviors. Reviewed [place] Substance Reporting system prior to prescribing opiate medications, no inconsistencies noted. Reviewed [place] Substance Reporting system prior prescribing opiate medications, no inconsistencies noted.     RTC: 1 month for sickle cell anemia and medication management   [name], FNP

## 2016-09-07 NOTE — Discharge Summary (Signed)
[facility] Discharge Summary   Patient ID: Dawn [name] MRN: [id number] DOB/AGE: 02-01-1981 35 y.o.  Admit date: 09/07/2016 Discharge date: 09/07/2016  Primary Care Physician:  Angelica Chessman, MD  Admission Diagnoses:  Active Problems:   Sickle cell crisis [facility])  Discharge Medications: No current facility-administered medications on file prior to encounter.    Current Outpatient Prescriptions on File Prior to Encounter  Medication Sig Dispense Refill  . folic acid (FOLVITE) 1 MG tablet Take 1 tablet (1 mg total) by mouth daily. 30 tablet 11  . hydroxyurea (HYDREA) 500 MG capsule Take 2 capsules (1,000 mg total) by mouth daily. May take with food to minimize GI side effects. 60 capsule 11  . levonorgestrel (MIRENA) 20 MCG/24HR IUD 1 each by Intrauterine route once.    . loratadine (CLARITIN) 10 MG tablet Take 10 mg by mouth daily.    Marland Kitchen morphine (MS CONTIN) 15 MG 12 hr tablet Take 15 mg by mouth every 12 (twelve) hours.    . Multiple Vitamin (MULTIVITAMIN WITH MINERALS) TABS tablet Take 1 tablet by mouth daily.    . Oxycodone HCl 10 MG TABS 1 tablet 4 (four) times daily as needed.  0  . potassium chloride SA (K-DUR,KLOR-CON) 20 MEQ tablet Take 2 tablets (40 mEq total) by mouth daily. 14 tablet 0  . promethazine (PHENERGAN) 25 MG tablet Take 1 tablet (25 mg total) by mouth every 6 (six) hours as needed for nausea or vomiting. 30 tablet 3  . Vitamin D, Ergocalciferol, (DRISDOL) 50000 units CAPS capsule Take 1 capsule (50,000 Units total) by mouth every 7 (seven) days. Takes on Sundays. 4 capsule 11      Consults:  None  Significant Diagnostic Studies:  No results found.   [facility] Course: Dawn [name], a 35 year old female with a female of sickle cell anemia was admitted to the day infusion center for pain management.  Reviewed labs, hemodynamically stable. Patient was started on IV dilaudid per weight based rapid redosing. She used a total of  6.5 mg. Pain intensity decreased from 8/10 to 5/10. Patient is alert, oriented, and ambulatory.  Recommend continue medications as previously prescribed. Increase water intake to 64 ounces per day. Follow up in clinic as scheduled.  Patient will discharge home with family in stable condition.  Physical Exam at Discharge:   BP 123/66   Pulse 86   Temp 98.6 F (37 C) (Oral)   Resp 16   Ht 5\' 3"  (1.6 m)   Wt 172 lb (78 kg)   SpO2 98%   BMI 30.47 kg/m   General Appearance:    Alert, cooperative, no distress, appears stated age  Head:    Normocephalic, without obvious abnormality, atraumatic  Back:     Symmetric, no curvature, ROM normal, no CVA tenderness  Lungs:     Clear to auscultation bilaterally, respirations unlabored  Chest Wall:    No tenderness or deformity   Heart:    Regular rate and rhythm, S1 and S2 normal, no murmur, rub   or gallop  Abdomen:     Soft, non-tender, bowel sounds active all four quadrants,    no masses, no organomegaly  Extremities:   Extremities normal, atraumatic, no cyanosis or edema  Pulses:   2+ and symmetric all extremities  Skin:   Skin color, texture, turgor normal, no rashes or lesions  Lymph nodes:   Cervical, supraclavicular, and axillary nodes normal  Neurologic:   CNII-XII intact,  normal strength, sensation and reflexes    throughout    Disposition at Discharge: 01-Home or Self Care  Discharge Orders: Discharge Instructions    Discharge patient    Complete by:  As directed       Condition at Discharge:   Stable  Time spent on Discharge:  15 minutes  Signed: Yeila Morro M 09/07/2016, 3:24 PM

## 2016-09-07 NOTE — Progress Notes (Signed)
Patient is here for FU  Patient complains of lower back pain being present at a 7.  Patient has taken medication today. Patient has eaten today.

## 2016-09-18 ENCOUNTER — Encounter (HOSPITAL_COMMUNITY): Payer: Self-pay | Admitting: Emergency Medicine

## 2016-09-18 ENCOUNTER — Emergency Department (HOSPITAL_COMMUNITY)
Admission: EM | Admit: 2016-09-18 | Discharge: 2016-09-18 | Disposition: A | Discharge status: Home / Self Care | Payer: Medicare Other | Attending: Emergency Medicine | Admitting: Emergency Medicine

## 2016-09-18 DIAGNOSIS — Z79899 Other long term (current) drug therapy: Secondary | ICD-10-CM | POA: Diagnosis not present

## 2016-09-18 DIAGNOSIS — I10 Essential (primary) hypertension: Secondary | ICD-10-CM | POA: Insufficient documentation

## 2016-09-18 DIAGNOSIS — D57219 Sickle-cell/Hb-C disease with crisis, unspecified: Secondary | ICD-10-CM | POA: Insufficient documentation

## 2016-09-18 DIAGNOSIS — D5700 Hb-SS disease with crisis, unspecified: Secondary | ICD-10-CM | POA: Diagnosis not present

## 2016-09-18 LAB — COMPREHENSIVE METABOLIC PANEL
ALK PHOS: 133 U/L — AB (ref 38–126)
ALT: 33 U/L (ref 14–54)
AST: 99 U/L — AB (ref 15–41)
Albumin: 2.8 g/dL — ABNORMAL LOW (ref 3.5–5.0)
Anion gap: 7 (ref 5–15)
BUN: 7 mg/dL (ref 6–20)
CALCIUM: 8.4 mg/dL — AB (ref 8.9–10.3)
CHLORIDE: 104 mmol/L (ref 101–111)
CO2: 21 mmol/L — AB (ref 22–32)
CREATININE: 0.4 mg/dL — AB (ref 0.44–1.00)
GFR calc Af Amer: >60 mL/min (ref >60)
GFR calc non Af Amer: >60 mL/min (ref >60)
Glucose, Bld: 78 mg/dL (ref 65–99)
Potassium: 3.5 mmol/L (ref 3.5–5.1)
SODIUM: 132 mmol/L — AB (ref 135–145)
Total Bilirubin: 13.3 mg/dL — ABNORMAL HIGH (ref 0.3–1.2)
Total Protein: 8.2 g/dL — ABNORMAL HIGH (ref 6.5–8.1)

## 2016-09-18 LAB — CBC WITH DIFFERENTIAL/PLATELET
BASOS PCT: 0 %
Basophils Absolute: 0 10*3/uL (ref 0.0–0.1)
EOS ABS: 0.1 10*3/uL (ref 0.0–0.7)
EOS PCT: 1 %
HEMATOCRIT: 24.7 % — AB (ref 36.0–46.0)
Hemoglobin: 9 g/dL — ABNORMAL LOW (ref 12.0–15.0)
LYMPHS ABS: 2.2 10*3/uL (ref 0.7–4.0)
Lymphocytes Relative: 25 %
MCH: 35.3 pg — ABNORMAL HIGH (ref 26.0–34.0)
MCHC: 36.4 g/dL — ABNORMAL HIGH (ref 30.0–36.0)
MCV: 96.9 fL (ref 78.0–100.0)
MONO ABS: 1.3 10*3/uL — AB (ref 0.1–1.0)
MONOS PCT: 15 %
Neutro Abs: 5.2 10*3/uL (ref 1.7–7.7)
Neutrophils Relative %: 59 %
Platelets: 208 10*3/uL (ref 150–400)
RBC: 2.55 MIL/uL — ABNORMAL LOW (ref 3.87–5.11)
RDW: 19.9 % — ABNORMAL HIGH (ref 11.5–15.5)
WBC: 8.8 10*3/uL (ref 4.0–10.5)

## 2016-09-18 LAB — RETICULOCYTES
RBC.: 2.55 MIL/uL — AB (ref 3.87–5.11)
RETIC COUNT ABSOLUTE: 517.7 10*3/uL — AB (ref 19.0–186.0)
Retic Ct Pct: 20.3 % — ABNORMAL HIGH (ref 0.4–3.1)

## 2016-09-18 MED ORDER — DIPHENHYDRAMINE HCL 50 MG/ML IJ SOLN
25.0000 mg | Freq: Once | INTRAMUSCULAR | Status: AC
  Administered 2016-09-18: 25 mg via INTRAVENOUS
  Filled 2016-09-18: qty 1

## 2016-09-18 MED ORDER — HYDROMORPHONE HCL 2 MG/ML IJ SOLN
2.0000 mg | Freq: Once | INTRAMUSCULAR | Status: AC
Start: 2016-09-18 — End: 2016-09-18
  Administered 2016-09-18: 2 mg via INTRAVENOUS
  Filled 2016-09-18: qty 1

## 2016-09-18 MED ORDER — SODIUM CHLORIDE 0.9 % IV SOLN: INTRAVENOUS | Status: DC

## 2016-09-18 MED ORDER — HYDROMORPHONE HCL 1 MG/ML IJ SOLN
1.0000 mg | Freq: Once | INTRAMUSCULAR | Status: AC
  Administered 2016-09-18: 1 mg via INTRAVENOUS
  Filled 2016-09-18: qty 1

## 2016-09-18 MED ORDER — HYDROMORPHONE HCL 2 MG/ML IJ SOLN
2.0000 mg | Freq: Once | INTRAMUSCULAR | Status: AC
  Administered 2016-09-18: 2 mg via INTRAVENOUS
  Filled 2016-09-18: qty 1

## 2016-09-18 MED ORDER — SODIUM CHLORIDE 0.9 % IV BOLUS (SEPSIS)
1000.0000 mL | Freq: Once | INTRAVENOUS | Status: AC
  Administered 2016-09-18: 1000 mL via INTRAVENOUS

## 2016-09-18 MED ORDER — ONDANSETRON HCL 4 MG/2ML IJ SOLN
4.0000 mg | Freq: Once | INTRAMUSCULAR | Status: AC
  Administered 2016-09-18: 4 mg via INTRAVENOUS
  Filled 2016-09-18: qty 2

## 2016-09-18 NOTE — ED Provider Notes (Addendum)
[facility] DEPT Provider Note   CSN: [id number] Arrival date & time: 09/18/16  1138     History   Chief Complaint Chief Complaint  Patient presents with  . Sickle Cell Pain Crisis    HPI Dawn [name] is a 35 y.o. female.  Patient unknown sickle cell patient. Patient seen and followed by sickle cell clinic at [facility]. Patient with acute interventions in the clinic on December 13 and 15th for pain control. Patient today with typical sickle cell pain in her back both legs and both arms. No fevers. Gradually been getting worse but got worse today. Started on Sunday. Pain is worse in her legs. No fevers no respiratory symptoms.      Past Medical History:  Diagnosis Date  . Blood transfusion   . Demand ischemia ([place]) 02/06/2016  . HCAP (healthcare-associated pneumonia) 05/19/2013  . 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 Pulmonary hypertension 02/06/2016   49 mmHg per echo on 02/06/16   . 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 Sickle cell anemia (HCC)   . Sickle cell disease ([place])   . Sickle cell disease, type S ([place])   . Vitamin B12 deficiency 07/07/2015    Patient Active Problem List   Diagnosis Date Noted  . Hypoxia   . Positive D dimer   . Sickle cell anemia with pain ([place]) 04/10/2016  . Pulmonary hypertension 02/16/2016  . 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 Pyrexia   . Sickle-cell/Hb-C disease with crisis ([place]) 02/13/2016  . Anemia 02/13/2016  . Demand ischemia ([place]) 02/13/2016  . Sepsis ([place]) 02/13/2016  . Fever 02/05/2016  . Tachycardia   . Anemia of chronic disease   . Chronic pain syndrome 10/14/2015  . UTI (urinary tract infection) 08/25/2015  . 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 Vitamin B12 deficiency 07/07/2015  . Acute pulmonary edema ([place]) 07/06/2015  . Thrombocytopenia ([place]) 07/06/2015  . Acute respiratory failure with hypoxia ([place]) 09/12/2014  . Hyperbilirubinemia 09/11/2014  . Sickle cell disease with crisis ([place]) 09/09/2014    . Anxiety state 06/15/2014  . Nosebleed 06/15/2014  . Macrocytosis 06/13/2014  . Sickle cell crisis ([place]) 06/12/2014  . Sickle cell anemia with crisis ([place]) 05/24/2014  . Vitamin D deficiency 11/23/2013  . Hb-SS disease without crisis ([place]) 11/23/2013  . Abnormal chest CT 08/07/2013  . Pain in the chest 08/03/2013  . Fungemia 07/28/2013  . Fever, unspecified 07/27/2013  . Hb-SS disease with acute chest syndrome ([place]) 05/19/2013  . Hypoxemia 05/14/2013  . 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 Hypokalemia 03/28/2012  . Hypoalbuminemia 03/28/2012  . 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 Leukocytosis 05/26/2011  . Elevated LFTs 04/27/2011  . Sickle cell anemia ([place]) 04/27/2011  . Sickle cell disease ([place]) 04/26/2011    Past Surgical History:  Procedure Laterality Date  .  left knee ACL reconstruction    . CESAREAN SECTION     x 2  . CHOLECYSTECTOMY    . IR GENERIC HISTORICAL  07/17/2016   IR FLUORO GUIDE PORT INSERTION RIGHT 07/17/2016 Aletta Edouard, MD [facility]-INTERV RAD  . IR GENERIC HISTORICAL  07/17/2016   IR US GUIDE VASC ACCESS RIGHT 07/17/2016 Aletta Edouard, MD [facility]-INTERV RAD  . PORT-A-CATH REMOVAL Left 07/29/2013   Procedure: REMOVAL PORT-A-CATH;  Surgeon: Scherry Ran, MD;  Location: [facility] ORS;  Service: General;  Laterality: Left;  . PORTACATH PLACEMENT    . PORTACATH PLACEMENT Left 02/23/2013   Procedure: INSERTION PORT-A-CATH;  Surgeon: Donato Heinz, MD;  Location: [facility] ORS;  Service: General;  Laterality: Left;  . TEE WITHOUT CARDIOVERSION N/A 08/07/2013   Procedure: TRANSESOPHAGEAL ECHOCARDIOGRAM (TEE);  Surgeon: Arnoldo Lenis, MD;  Location: [facility] ENDO SUITE;  Service: Cardiology;  Laterality: N/A;    OB History    Gravida Para Term Preterm AB Living   1 1   1   1    SAB TAB Ectopic Multiple Live Births                   Home Medications    Prior to Admission medications   Medication Sig Start Date End Date Taking? Authorizing  Provider  folic acid (FOLVITE) 1 MG tablet Take 1 tablet (1 mg total) by mouth daily. 03/14/16  Yes [name], FNP  hydroxyurea (HYDREA) 500 MG capsule Take 2 capsules (1,000 mg total) by mouth daily. May take with food to minimize GI side effects. 03/14/16  Yes [name], FNP  levonorgestrel (MIRENA) 20 MCG/24HR IUD 1 each by Intrauterine route once.   Yes Historical Provider, MD  loratadine (CLARITIN) 10 MG tablet Take 10 mg by mouth daily.   Yes Historical Provider, MD  morphine (MS CONTIN) 15 MG 12 hr tablet Take 15 mg by mouth every 12 (twelve) hours.   Yes Historical Provider, MD  Multiple Vitamin (MULTIVITAMIN WITH MINERALS) TABS tablet Take 1 tablet by mouth daily.   Yes Historical Provider, MD  Oxycodone HCl 10 MG TABS 1 tablet 4 (four) times daily as needed. 08/08/16  Yes Historical Provider, MD  potassium chloride SA (K-DUR,KLOR-CON) 20 MEQ tablet Take 2 tablets (40 mEq total) by mouth daily. 08/22/16  Yes Tammy Triplett, PA-C  promethazine (PHENERGAN) 25 MG tablet Take 1 tablet (25 mg total) by mouth every 6 (six) hours as needed for nausea or vomiting. 07/20/16  Yes Tresa Garter, MD  Vitamin D, Ergocalciferol, (DRISDOL) 50000 units CAPS capsule Take 1 capsule (50,000 Units total) by mouth every 7 (seven) days. Takes on Sundays. 06/01/16  Yes Micheline Chapman, NP    Family History Family History  Problem Relation Age of Onset  . Sickle cell trait Mother   . Sickle cell trait Father   . Hypertension Father   . Diabetes Father   . Sickle cell trait Sister   . Cancer Paternal Aunt     Breast    Social History Social History  Substance Use Topics  . Smoking status: Never Smoker  . Smokeless tobacco: Never Used  . Alcohol use No     Allergies   Desferal [deferoxamine]; Latex; Lisinopril; Tape; and Tylenol [acetaminophen]   Review of Systems Review of Systems  Constitutional: Negative for fever.  HENT: Negative for congestion.   Eyes: Negative for  visual disturbance.  Respiratory: Negative for shortness of breath.   Cardiovascular: Negative for chest pain.  Gastrointestinal: Negative for abdominal pain.  Genitourinary: Negative for dysuria.  Musculoskeletal: Positive for back pain and myalgias.  Skin: Negative for rash.  Allergic/Immunologic: Positive for immunocompromised state.  Neurological: Negative for headaches.  Hematological: Does not bruise/bleed easily.  Psychiatric/Behavioral: Negative for confusion.     Physical Exam Updated Vital Signs BP 134/83   Pulse 96   Temp 99.1 F (37.3 C) (Oral)   Resp 14   Ht 5\' 3"  (1.6 m)   Wt 77.6 kg   SpO2 91%   BMI 30.29 kg/m   Physical Exam  Constitutional: She is oriented to person, place, and time. She appears well-developed and well-nourished.  HENT:  Head: Normocephalic  and atraumatic.  Mouth/Throat: Oropharynx is clear and moist.  Eyes: EOM are normal. Pupils are equal, round, and reactive to light.  Neck: Normal range of motion. Neck supple.  Cardiovascular: Normal rate, regular rhythm and normal heart sounds.   Pulmonary/Chest: Effort normal and breath sounds normal. No respiratory distress.  Abdominal: Soft. Bowel sounds are normal. There is no tenderness.  Musculoskeletal: Normal range of motion.  Neurological: She is alert and oriented to person, place, and time. No cranial nerve deficit. She exhibits normal muscle tone. Coordination normal.  Skin: Skin is warm. Capillary refill takes less than 2 seconds.  Nursing note and vitals reviewed.    ED Treatments / Results  Labs (all labs ordered are listed, but only abnormal results are displayed) Labs Reviewed  COMPREHENSIVE METABOLIC PANEL - Abnormal; Notable for the following:       Result Value   Sodium 132 (*)    CO2 21 (*)    Creatinine, Ser 0.40 (*)    Calcium 8.4 (*)    Total Protein 8.2 (*)    Albumin 2.8 (*)    AST 99 (*)    Alkaline Phosphatase 133 (*)    Total Bilirubin 13.3 (*)    All other  components within normal limits  CBC WITH DIFFERENTIAL/PLATELET - Abnormal; Notable for the following:    RBC 2.55 (*)    Hemoglobin 9.0 (*)    HCT 24.7 (*)    MCH 35.3 (*)    MCHC 36.4 (*)    RDW 19.9 (*)    Monocytes Absolute 1.3 (*)    All other components within normal limits  RETICULOCYTES - Abnormal; Notable for the following:    Retic Ct Pct 20.3 (*)    RBC. 2.55 (*)    Retic Count, Manual 517.7 (*)    All other components within normal limits    EKG  EKG Interpretation None       Radiology No results found.  Procedures Procedures (including critical care time)  Medications Ordered in ED Medications  0.9 %  sodium chloride infusion (not administered)  HYDROmorphone (DILAUDID) injection 2 mg (not administered)  sodium chloride 0.9 % bolus 1,000 mL (1,000 mLs Intravenous New Bag/Given 09/18/16 1750)  ondansetron (ZOFRAN) injection 4 mg (4 mg Intravenous Given 09/18/16 1626)  HYDROmorphone (DILAUDID) injection 1 mg (1 mg Intravenous Given 09/18/16 1626)  diphenhydrAMINE (BENADRYL) injection 25 mg (25 mg Intravenous Given 09/18/16 1626)  HYDROmorphone (DILAUDID) injection 2 mg (2 mg Intravenous Given 09/18/16 1841)  HYDROmorphone (DILAUDID) injection 2 mg (2 mg Intravenous Given 09/18/16 1943)  ondansetron (ZOFRAN) injection 4 mg (4 mg Intravenous Given 09/18/16 1943)     Initial Impression / Assessment and Plan / ED Course  I have reviewed the triage vital signs and the nursing notes.  Pertinent labs & imaging results that were available during my care of the patient were reviewed by me and considered in my medical decision making (see chart for details).  Clinical Course     The patient known sickle cell disease. Patient seen December 13 and 15th and sickle cell clinic for acute intervention for sickle pain. Patient today with pain complaint in her low back both legs both arms typical for her sickle cell complaint. Patient's already arranged follow-up with  sickle cell clinic. Patient here treated with Benadryl Zofran and 8 mg of hydromorphone with improvement. Labs consistent with crisis. Patient nontoxic no acute distress.  Final Clinical Impressions(s) / ED Diagnoses   Final diagnoses:  Sickle  cell pain crisis [facility])    New Prescriptions New Prescriptions   No medications on file     Fredia Sorrow, MD 09/18/16 [id number]    Fredia Sorrow, MD 09/18/16 2036

## 2016-09-18 NOTE — Discharge Instructions (Signed)
Follow-up with sickle cell clinic at [facility]. Return for any new or worse symptoms.

## 2016-09-18 NOTE — ED Triage Notes (Signed)
Pt c/o sickle cell crisis that started on Sunday. Pt reports pain is the worst in her legs.

## 2016-09-25 ENCOUNTER — Emergency Department (HOSPITAL_COMMUNITY)
Admission: EM | Admit: 2016-09-25 | Discharge: 2016-09-25 | Disposition: A | Discharge status: Home / Self Care | Payer: Medicare Other | Attending: Emergency Medicine | Admitting: Emergency Medicine

## 2016-09-25 ENCOUNTER — Encounter (HOSPITAL_COMMUNITY): Payer: Self-pay | Admitting: Emergency Medicine

## 2016-09-25 DIAGNOSIS — D5700 Hb-SS disease with crisis, unspecified: Secondary | ICD-10-CM

## 2016-09-25 DIAGNOSIS — Z91040 Latex allergy status: Secondary | ICD-10-CM | POA: Diagnosis not present

## 2016-09-25 DIAGNOSIS — M545 Low back pain, unspecified: Secondary | ICD-10-CM

## 2016-09-25 DIAGNOSIS — G8929 Other chronic pain: Secondary | ICD-10-CM

## 2016-09-25 DIAGNOSIS — D57219 Sickle-cell/Hb-C disease with crisis, unspecified: Secondary | ICD-10-CM | POA: Diagnosis not present

## 2016-09-25 DIAGNOSIS — Z79899 Other long term (current) drug therapy: Secondary | ICD-10-CM | POA: Insufficient documentation

## 2016-09-25 LAB — CBC WITH DIFFERENTIAL/PLATELET
BASOS ABS: 0.1 10*3/uL (ref 0.0–0.1)
Basophils Relative: 1 %
EOS ABS: 0.1 10*3/uL (ref 0.0–0.7)
EOS PCT: 1 %
HCT: 22 % — ABNORMAL LOW (ref 36.0–46.0)
Hemoglobin: 7.9 g/dL — ABNORMAL LOW (ref 12.0–15.0)
Lymphocytes Relative: 29 %
Lymphs Abs: 2.4 10*3/uL (ref 0.7–4.0)
MCH: 34.6 pg — ABNORMAL HIGH (ref 26.0–34.0)
MCHC: 35.9 g/dL (ref 30.0–36.0)
MCV: 96.5 fL (ref 78.0–100.0)
MONO ABS: 1.4 10*3/uL — AB (ref 0.1–1.0)
Monocytes Relative: 17 %
NEUTROS PCT: 52 %
Neutro Abs: 4.1 10*3/uL (ref 1.7–7.7)
PLATELETS: 180 10*3/uL (ref 150–400)
RBC: 2.28 MIL/uL — AB (ref 3.87–5.11)
RDW: 19.2 % — ABNORMAL HIGH (ref 11.5–15.5)
WBC: 8.1 10*3/uL (ref 4.0–10.5)

## 2016-09-25 LAB — COMPREHENSIVE METABOLIC PANEL
ALT: 33 U/L (ref 14–54)
AST: 92 U/L — ABNORMAL HIGH (ref 15–41)
Albumin: 2.7 g/dL — ABNORMAL LOW (ref 3.5–5.0)
Alkaline Phosphatase: 130 U/L — ABNORMAL HIGH (ref 38–126)
Anion gap: 7 (ref 5–15)
BUN: 6 mg/dL (ref 6–20)
CHLORIDE: 105 mmol/L (ref 101–111)
CO2: 24 mmol/L (ref 22–32)
CREATININE: 0.42 mg/dL — AB (ref 0.44–1.00)
Calcium: 8.7 mg/dL — ABNORMAL LOW (ref 8.9–10.3)
GFR calc Af Amer: >60 mL/min (ref >60)
Glucose, Bld: 97 mg/dL (ref 65–99)
Potassium: 3.3 mmol/L — ABNORMAL LOW (ref 3.5–5.1)
Sodium: 136 mmol/L (ref 135–145)
Total Bilirubin: 14 mg/dL — ABNORMAL HIGH (ref 0.3–1.2)
Total Protein: 7.9 g/dL (ref 6.5–8.1)

## 2016-09-25 LAB — RETICULOCYTES
RBC.: 2.28 MIL/uL — AB (ref 3.87–5.11)
RETIC COUNT ABSOLUTE: 487.9 10*3/uL — AB (ref 19.0–186.0)
RETIC CT PCT: 21.4 % — AB (ref 0.4–3.1)

## 2016-09-25 MED ORDER — PROMETHAZINE HCL 12.5 MG PO TABS: 12.5000 mg | ORAL_TABLET | Freq: Four times a day (QID) | ORAL | 0 refills | Status: DC | PRN

## 2016-09-25 MED ORDER — OXYCODONE HCL ER 15 MG PO T12A
15.0000 mg | EXTENDED_RELEASE_TABLET | Freq: Two times a day (BID) | ORAL | Status: DC
  Filled 2016-09-25 (×2): qty 1

## 2016-09-25 MED ORDER — HYDROMORPHONE HCL 2 MG/ML IJ SOLN
2.0000 mg | Freq: Once | INTRAMUSCULAR | Status: AC
  Administered 2016-09-25: 2 mg via INTRAVENOUS
  Filled 2016-09-25: qty 1

## 2016-09-25 MED ORDER — HYDROMORPHONE HCL 1 MG/ML IJ SOLN
0.5000 mg | Freq: Once | INTRAMUSCULAR | Status: DC
  Filled 2016-09-25: qty 1

## 2016-09-25 MED ORDER — ONDANSETRON HCL 4 MG/2ML IJ SOLN
4.0000 mg | Freq: Once | INTRAMUSCULAR | Status: AC
  Administered 2016-09-25: 4 mg via INTRAVENOUS
  Filled 2016-09-25: qty 2

## 2016-09-25 MED ORDER — OXYCODONE HCL 10 MG PO TABS
10.0000 mg | ORAL_TABLET | Freq: Four times a day (QID) | ORAL | 0 refills | Status: DC | PRN
Start: 2016-09-25 — End: 2016-11-01

## 2016-09-25 MED ORDER — OXYCODONE HCL ER 15 MG PO T12A
15.0000 mg | EXTENDED_RELEASE_TABLET | Freq: Two times a day (BID) | ORAL | Status: DC
  Administered 2016-09-25: 15 mg via ORAL
  Filled 2016-09-25: qty 1

## 2016-09-25 MED ORDER — HYDROMORPHONE HCL 1 MG/ML IJ SOLN
1.0000 mg | Freq: Once | INTRAMUSCULAR | Status: AC
  Administered 2016-09-25: 1 mg via INTRAVENOUS
  Filled 2016-09-25: qty 1

## 2016-09-25 MED ORDER — HYDROMORPHONE HCL 1 MG/ML IJ SOLN
0.5000 mg | Freq: Once | INTRAMUSCULAR | Status: AC
  Administered 2016-09-25: 0.5 mg via INTRAVENOUS

## 2016-09-25 MED ORDER — DEXTROSE 5 % AND 0.45 % NACL IV BOLUS
1000.0000 mL | Freq: Once | INTRAVENOUS | Status: AC
  Administered 2016-09-25: 1000 mL via INTRAVENOUS

## 2016-09-25 MED ORDER — KETOROLAC TROMETHAMINE 30 MG/ML IJ SOLN
30.0000 mg | Freq: Once | INTRAMUSCULAR | Status: AC
  Administered 2016-09-25: 30 mg via INTRAVENOUS
  Filled 2016-09-25: qty 1

## 2016-09-25 MED ORDER — DIPHENHYDRAMINE HCL 50 MG/ML IJ SOLN
25.0000 mg | Freq: Once | INTRAMUSCULAR | Status: AC
  Administered 2016-09-25: 25 mg via INTRAVENOUS
  Filled 2016-09-25: qty 1

## 2016-09-25 MED ORDER — HEPARIN SOD (PORK) LOCK FLUSH 100 UNIT/ML IV SOLN
500.0000 [IU] | Freq: Once | INTRAVENOUS | Status: AC
  Administered 2016-09-25: 500 [IU]
  Filled 2016-09-25: qty 5

## 2016-09-25 NOTE — Telephone Encounter (Signed)
Encounter closed

## 2016-09-25 NOTE — ED Provider Notes (Signed)
[place] DEPT Provider Note   CSN: [id number] Arrival date & time: 09/25/16  0904     History   Chief Complaint Chief Complaint  Patient presents with  . Sickle Cell Pain Crisis    HPI Dawn [name] is a 36 y.o. female with past medical history of sickle cell anemia presents to the emergency department complaining of constant 8/10 bilateral low back pain that radiates to bilateral legs that started last week. Patient states that she went to the emergency department on 09/18/2016 for the same pain, in the emergency department her pain decreased to a 4/10 and tolerable. Patient states the next day after the emergency department the pain started to come back and has been consistently worsening which brought her to the emergency department again today. Patient takes oxycodone 10 mg q 6 hr and OxyContin 15 mg q12 hours for pain with minimal relief. Patient denies chest pain, shortness of breath, cough, nasal congestion, nausea, vomiting, abdominal pain, urinary symptoms, fevers.  HPI  Past Medical History:  Diagnosis Date  . Blood transfusion   . Demand ischemia ([place]) 02/06/2016  . HCAP (healthcare-associated pneumonia) 05/19/2013  . 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 Pulmonary hypertension 02/06/2016   49 mmHg per echo on 02/06/16   . 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 Sickle cell anemia (HCC)   . Sickle cell disease ([place])   . 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 Vitamin B12 deficiency 07/07/2015    Patient Active Problem List   Diagnosis Date Noted  . Hypoxia   . Positive D dimer   . Sickle cell anemia with pain ([place]) 04/10/2016  . Pulmonary hypertension 02/16/2016  . 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 Pyrexia   . Sickle-cell/Hb-C disease with crisis ([place]) 02/13/2016  . Anemia 02/13/2016  . Demand ischemia ([place]) 02/13/2016  . Sepsis ([place]) 02/13/2016  . Fever 02/05/2016  . Tachycardia   . Anemia of chronic disease   . Chronic pain syndrome 10/14/2015   . UTI (urinary tract infection) 08/25/2015  . 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 Vitamin B12 deficiency 07/07/2015  . Acute pulmonary edema ([place]) 07/06/2015  . Thrombocytopenia ([place]) 07/06/2015  . Acute respiratory failure with hypoxia ([place]) 09/12/2014  . Hyperbilirubinemia 09/11/2014  . Sickle cell disease with crisis ([place]) 09/09/2014  . Anxiety state 06/15/2014  . Nosebleed 06/15/2014  . Macrocytosis 06/13/2014  . Sickle cell crisis ([place]) 06/12/2014  . Sickle cell anemia with crisis ([place]) 05/24/2014  . Vitamin D deficiency 11/23/2013  . 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 Abnormal chest CT 08/07/2013  . Pain in the chest 08/03/2013  . Fungemia 07/28/2013  . Fever, unspecified 07/27/2013  . 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 Hypoxemia 05/14/2013  . 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 Hypokalemia 03/28/2012  . Hypoalbuminemia 03/28/2012  . 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 Leukocytosis 05/26/2011  . Elevated LFTs 04/27/2011  . Sickle cell anemia ([place]) 04/27/2011  . Sickle cell disease ([place]) 04/26/2011    Past Surgical History:  Procedure Laterality Date  .  left knee ACL reconstruction    . CESAREAN SECTION     x 2  . CHOLECYSTECTOMY    . IR GENERIC HISTORICAL  07/17/2016   IR FLUORO GUIDE PORT INSERTION RIGHT 07/17/2016 Aletta Edouard, MD [facility]  . IR GENERIC HISTORICAL  07/17/2016   IR US GUIDE VASC ACCESS RIGHT 07/17/2016 Aletta Edouard, MD [facility]  . PORT-A-CATH REMOVAL Left 07/29/2013  Procedure: REMOVAL PORT-A-CATH;  Surgeon: Scherry Ran, MD;  Location: [facility];  Service: General;  Laterality: Left;  . PORTACATH PLACEMENT    . PORTACATH PLACEMENT Left 02/23/2013   Procedure: INSERTION PORT-A-CATH;  Surgeon: Donato Heinz, MD;  Location: [facility];  Service: General;  Laterality: Left;  . TEE WITHOUT CARDIOVERSION N/A 08/07/2013   Procedure: TRANSESOPHAGEAL ECHOCARDIOGRAM  (TEE);  Surgeon: Arnoldo Lenis, MD;  Location: [facility];  Service: Cardiology;  Laterality: N/A;    OB History    Gravida Para Term Preterm AB Living   1 1   1   1    SAB TAB Ectopic Multiple Live Births                   Home Medications    Prior to Admission medications   Medication Sig Start Date End Date Taking? Authorizing Provider  folic acid (FOLVITE) 1 MG tablet Take 1 tablet (1 mg total) by mouth daily. 03/14/16  Yes [name], FNP  hydroxyurea (HYDREA) 500 MG capsule Take 2 capsules (1,000 mg total) by mouth daily. May take with food to minimize GI side effects. 03/14/16  Yes [name], FNP  levonorgestrel (MIRENA) 20 MCG/24HR IUD 1 each by Intrauterine route once.   Yes Historical Provider, MD  loratadine (CLARITIN) 10 MG tablet Take 10 mg by mouth daily.   Yes Historical Provider, MD  morphine (MS CONTIN) 15 MG 12 hr tablet Take 15 mg by mouth every 12 (twelve) hours.   Yes Historical Provider, MD  Multiple Vitamin (MULTIVITAMIN WITH MINERALS) TABS tablet Take 1 tablet by mouth daily.   Yes Historical Provider, MD  Vitamin D, Ergocalciferol, (DRISDOL) 50000 units CAPS capsule Take 1 capsule (50,000 Units total) by mouth every 7 (seven) days. Takes on Sundays. 06/01/16  Yes Micheline Chapman, NP  Oxycodone HCl 10 MG TABS Take 1 tablet (10 mg total) by mouth every 6 (six) hours as needed. 09/25/16   Kinnie Feil, PA-C  potassium chloride SA (K-DUR,KLOR-CON) 20 MEQ tablet Take 2 tablets (40 mEq total) by mouth daily. Patient not taking: Reported on 09/25/2016 08/22/16   Tammy Triplett, PA-C  promethazine (PHENERGAN) 12.5 MG tablet Take 1 tablet (12.5 mg total) by mouth every 6 (six) hours as needed for nausea or vomiting. 09/25/16   Kinnie Feil, PA-C    Family History Family History  Problem Relation Age of Onset  . Sickle cell trait Mother   . Sickle cell trait Father   . Hypertension Father   . Diabetes Father   . Sickle cell trait Sister   .  Cancer Paternal Aunt     Breast    Social History Social History  Substance Use Topics  . Smoking status: Never Smoker  . Smokeless tobacco: Never Used  . Alcohol use No     Allergies   Desferal [deferoxamine]; Latex; Lisinopril; Tape; and Tylenol [acetaminophen]   Review of Systems Review of Systems  Constitutional: Negative for appetite change, chills and fever.  HENT: Negative for congestion and sore throat.   Eyes: Negative for visual disturbance.  Respiratory: Negative for cough, choking, chest tightness and shortness of breath.   Cardiovascular: Negative for chest pain, palpitations and leg swelling.  Gastrointestinal: Negative for abdominal pain, constipation, diarrhea, nausea and vomiting.  Genitourinary: Negative for difficulty urinating and hematuria.  Musculoskeletal: Positive for back pain and myalgias (bilateral leg). Negative for arthralgias, gait problem and joint swelling.  Skin: Negative for rash  and wound.  Neurological: Negative for dizziness, seizures, syncope, weakness, light-headedness, numbness and headaches.  Hematological: Does not bruise/bleed easily.  Psychiatric/Behavioral: Negative.      Physical Exam Updated Vital Signs BP 130/76   Pulse 75   Temp 99 F (37.2 C) (Oral)   Ht 5\' 3"  (1.6 m)   Wt 77.6 kg   SpO2 94%   BMI 30.29 kg/m   Physical Exam  Constitutional: She is oriented to person, place, and time. She appears well-developed and well-nourished. No distress.  HENT:  Head: Normocephalic and atraumatic.  Nose: Nose normal.  Mouth/Throat: Oropharynx is clear and moist. No oropharyngeal exudate.  Eyes: EOM are normal. Pupils are equal, round, and reactive to light. Scleral icterus is present.  Neck: Normal range of motion. Neck supple. No JVD present.  Cardiovascular: Normal rate, regular rhythm, normal heart sounds and intact distal pulses.   No murmur heard. Pulmonary/Chest: Effort normal and breath sounds normal. No respiratory  distress. She has no wheezes. She has no rales. She exhibits no tenderness.  Abdominal: Soft. Bowel sounds are normal. She exhibits no distension and no mass. There is no tenderness. There is no rebound and no guarding.  No CVAT or suprapubic tenderness.   Musculoskeletal: Normal range of motion. She exhibits tenderness (with deep palpation at low back and diffusely throughout bilateral legs). She exhibits no edema or deformity.  Lymphadenopathy:    She has no cervical adenopathy.  Neurological: She is alert and oriented to person, place, and time. No sensory deficit.  Skin: Skin is warm and dry. Capillary refill takes less than 2 seconds.  Psychiatric: She has a normal mood and affect. Her behavior is normal. Judgment and thought content normal.  Nursing note and vitals reviewed.    ED Treatments / Results  Labs (all labs ordered are listed, but only abnormal results are displayed) Labs Reviewed  COMPREHENSIVE METABOLIC PANEL - Abnormal; Notable for the following:       Result Value   Potassium 3.3 (*)    Creatinine, Ser 0.42 (*)    Calcium 8.7 (*)    Albumin 2.7 (*)    AST 92 (*)    Alkaline Phosphatase 130 (*)    Total Bilirubin 14.0 (*)    All other components within normal limits  CBC WITH DIFFERENTIAL/PLATELET - Abnormal; Notable for the following:    RBC 2.28 (*)    Hemoglobin 7.9 (*)    HCT 22.0 (*)    MCH 34.6 (*)    RDW 19.2 (*)    Monocytes Absolute 1.4 (*)    All other components within normal limits  RETICULOCYTES - Abnormal; Notable for the following:    Retic Ct Pct 21.4 (*)    RBC. 2.28 (*)    Retic Count, Manual 487.9 (*)    All other components within normal limits    EKG  EKG Interpretation None       Radiology No results found.  Procedures Procedures (including critical care time)  Medications Ordered in ED Medications  oxyCODONE (OXYCONTIN) 12 hr tablet 15 mg (15 mg Oral Given 09/25/16 1655)  HYDROmorphone (DILAUDID) injection 0.5 mg (0.5  mg Intravenous Given 09/25/16 0947)  ondansetron (ZOFRAN) injection 4 mg (4 mg Intravenous Given 09/25/16 0947)  HYDROmorphone (DILAUDID) injection 1 mg (1 mg Intravenous Given 09/25/16 1134)  ketorolac (TORADOL) 30 MG/ML injection 30 mg (30 mg Intravenous Given 09/25/16 1134)  diphenhydrAMINE (BENADRYL) injection 25 mg (25 mg Intravenous Given 09/25/16 1134)  dextrose 5 %  and 0.45% NaCl 5-0.45 % bolus 1,000 mL (0 mLs Intravenous Stopped 09/25/16 1522)  HYDROmorphone (DILAUDID) injection 2 mg (2 mg Intravenous Given 09/25/16 1314)  ondansetron (ZOFRAN) injection 4 mg (4 mg Intravenous Given 09/25/16 1314)  HYDROmorphone (DILAUDID) injection 2 mg (2 mg Intravenous Given 09/25/16 1528)  dextrose 5 % and 0.45% NaCl 5-0.45 % bolus 1,000 mL (1,000 mLs Intravenous New Bag/Given 09/25/16 1529)  HYDROmorphone (DILAUDID) injection 2 mg (2 mg Intravenous Given 09/25/16 1650)     Initial Impression / Assessment and Plan / ED Course  I have reviewed the triage vital signs and the nursing notes.  Pertinent labs & imaging results that were available during my care of the patient were reviewed by me and considered in my medical decision making (see chart for details).  Clinical Course as of Sep 25 1741  Tue Sep 25, 2016  1249 Re-evaluated pt: pt reports decreased pain to 7/10 from initial 9/10. No other complaints.  [CG]  1400 Re-evaluated pt after second dose of dilaudid, states pain has not improved since last time. Pain remains at 7/10. Will re-give dilaudid 2mg   [CG]  1623 No improvement of pain, still 7/10.  Pt requested another dose of dilaudid, given.  Will give oxycontin as well to prevent rebound pain.    [CG]  [id number] Reviewed.  Pt's Hgb seems to hover around low 8's at baseline Hemoglobin: (!) 7.9 [CG]  1727 Re-evaluation: pt reports decrease in pain to 5/10, pt ready for discharge. Does not want to stay to finish fluids.  [CG]    Clinical Course User Index [CG] Kinnie Feil, PA-C   36 year old female with  known history of sickle cell anemia presents with bilateral low back pain that radiates to bilateral lower extremities consistent with sickle cell pain reported previously. Per chart review she has been experiencing similar pain since 09/07/2016, when she required admission to obs unit for pain control for bilateral low back pain radiating to lower extremities on 09/07/2016. While in obs unit patient was given fluids and total of 6.5 mg of Dilaudid which improved her pain from an 8 to a 5 out of 10. Patient was discharged from observation unit on the same day. Patient denies fevers, chest pain, shortness of breath, urinary symptoms. I doubt patient has an infectious cause to her crisis. No ED imaging indicated at this time. ED lab work consistent with pain crisis. Hemoglobin at 7.9 however patient's hemoglobin seems to hover around low eights at baseline.  Initial pain report was a 9/10. Patient received Toradol 30 mg, 7.5 mg of Dilaudid, 2 L fluid bolus in the emergency department which brought down her pain to 5/10. Patient reported she felt comfortable going home without finishing the second bag of fluids. Patient requested prescription refill for oxycodone and Phenergan, given. Patient already has an appointment with PCP for next week. Encouraged patient to stay well-hydrated and return to the emergency department if she develops chest pain, shortness of breath, fevers patient verbalizes understanding and is agreeable to discharge plan.   Final Clinical Impressions(s) / ED Diagnoses   Final diagnoses:  Sickle cell pain crisis (HCC)  Chronic bilateral low back pain without sciatica    New Prescriptions New Prescriptions   OXYCODONE HCL 10 MG TABS    Take 1 tablet (10 mg total) by mouth every 6 (six) hours as needed.   PROMETHAZINE (PHENERGAN) 12.5 MG TABLET    Take 1 tablet (12.5 mg total) by mouth every 6 (six) hours as  needed for nausea or vomiting.     Kinnie Feil, PA-C 09/25/16  [place], MD 09/26/16 [phone]

## 2016-09-25 NOTE — ED Triage Notes (Signed)
Patient states sickle cell crisis that started last week. Sates pain has gotten worse over past week.

## 2016-09-25 NOTE — Discharge Instructions (Signed)
Please follow-up with your primary care provider next week to discuss your recent sickle cell crises and ED visits.  I have renewed sure Phenergan and oxycodone prescriptions. Please take these as prescribed and request a refill from your primary care provider next week.  Make sure he stay well-hydrated, drink plenty of water.  Return to the emergency department if you develop sudden onset chest pain, shortness of breath, fevers, pain not controlled with home pain medications.

## 2016-09-28 ENCOUNTER — Non-Acute Institutional Stay (HOSPITAL_COMMUNITY)
Admission: AD | Admit: 2016-09-28 | Discharge: 2016-09-28 | Disposition: A | Discharge status: Home / Self Care | Payer: Medicare Other | Source: Ambulatory Visit | Attending: Internal Medicine | Admitting: Internal Medicine

## 2016-09-28 ENCOUNTER — Telehealth (HOSPITAL_COMMUNITY): Payer: Self-pay | Admitting: *Deleted

## 2016-09-28 ENCOUNTER — Encounter (HOSPITAL_COMMUNITY): Payer: Self-pay | Admitting: *Deleted

## 2016-09-28 DIAGNOSIS — D5700 Hb-SS disease with crisis, unspecified: Secondary | ICD-10-CM | POA: Diagnosis not present

## 2016-09-28 DIAGNOSIS — Z79899 Other long term (current) drug therapy: Secondary | ICD-10-CM | POA: Insufficient documentation

## 2016-09-28 DIAGNOSIS — Z79891 Long term (current) use of opiate analgesic: Secondary | ICD-10-CM | POA: Insufficient documentation

## 2016-09-28 DIAGNOSIS — R52 Pain, unspecified: Secondary | ICD-10-CM | POA: Diagnosis present

## 2016-09-28 LAB — COMPREHENSIVE METABOLIC PANEL
ALT: 29 U/L (ref 14–54)
ANION GAP: 9 (ref 5–15)
AST: 90 U/L — ABNORMAL HIGH (ref 15–41)
Albumin: 2.8 g/dL — ABNORMAL LOW (ref 3.5–5.0)
Alkaline Phosphatase: 129 U/L — ABNORMAL HIGH (ref 38–126)
BUN: 5 mg/dL — ABNORMAL LOW (ref 6–20)
CALCIUM: 8.5 mg/dL — AB (ref 8.9–10.3)
CHLORIDE: 104 mmol/L (ref 101–111)
CO2: 23 mmol/L (ref 22–32)
Creatinine, Ser: 0.4 mg/dL — ABNORMAL LOW (ref 0.44–1.00)
GFR calc non Af Amer: >60 mL/min (ref >60)
Glucose, Bld: 89 mg/dL (ref 65–99)
Potassium: 3.4 mmol/L — ABNORMAL LOW (ref 3.5–5.1)
SODIUM: 136 mmol/L (ref 135–145)
Total Bilirubin: 15.4 mg/dL — ABNORMAL HIGH (ref 0.3–1.2)
Total Protein: 7.6 g/dL (ref 6.5–8.1)

## 2016-09-28 LAB — CBC WITH DIFFERENTIAL/PLATELET
BASOS PCT: 1 %
Basophils Absolute: 0.1 10*3/uL (ref 0.0–0.1)
EOS ABS: 0.1 10*3/uL (ref 0.0–0.7)
Eosinophils Relative: 1 %
HCT: 19.8 % — ABNORMAL LOW (ref 36.0–46.0)
Hemoglobin: 7.4 g/dL — ABNORMAL LOW (ref 12.0–15.0)
Lymphocytes Relative: 22 %
Lymphs Abs: 1.8 10*3/uL (ref 0.7–4.0)
MCH: 35.4 pg — AB (ref 26.0–34.0)
MCHC: 37.4 g/dL — AB (ref 30.0–36.0)
MCV: 94.7 fL (ref 78.0–100.0)
MONO ABS: 1.5 10*3/uL — AB (ref 0.1–1.0)
Monocytes Relative: 18 %
NEUTROS ABS: 4.7 10*3/uL (ref 1.7–7.7)
NEUTROS PCT: 58 %
PLATELETS: 177 10*3/uL (ref 150–400)
RBC: 2.09 MIL/uL — ABNORMAL LOW (ref 3.87–5.11)
RDW: 20.8 % — ABNORMAL HIGH (ref 11.5–15.5)
WBC: 8.2 10*3/uL (ref 4.0–10.5)

## 2016-09-28 LAB — URINALYSIS, ROUTINE W REFLEX MICROSCOPIC
Glucose, UA: NEGATIVE mg/dL
KETONES UR: NEGATIVE mg/dL
Leukocytes, UA: NEGATIVE
NITRITE: NEGATIVE
PROTEIN: 100 mg/dL — AB
Specific Gravity, Urine: 1.009 (ref 1.005–1.030)
pH: 7 (ref 5.0–8.0)

## 2016-09-28 LAB — TYPE AND SCREEN
ABO/RH(D): A POS
Antibody Screen: POSITIVE

## 2016-09-28 MED ORDER — KETOROLAC TROMETHAMINE 30 MG/ML IJ SOLN
30.0000 mg | Freq: Once | INTRAMUSCULAR | Status: AC
  Administered 2016-09-28: 30 mg via INTRAVENOUS
  Filled 2016-09-28: qty 1

## 2016-09-28 MED ORDER — SODIUM CHLORIDE 0.9% FLUSH
9.0000 mL | INTRAVENOUS | Status: DC | PRN
  Administered 2016-09-28: 9 mL via INTRAVENOUS

## 2016-09-28 MED ORDER — DIPHENHYDRAMINE HCL 25 MG PO CAPS: 25.0000 mg | ORAL_CAPSULE | ORAL | Status: DC | PRN

## 2016-09-28 MED ORDER — NALOXONE HCL 0.4 MG/ML IJ SOLN
0.4000 mg | INTRAMUSCULAR | Status: DC | PRN
Start: 2016-09-28 — End: 2016-09-28

## 2016-09-28 MED ORDER — HYDROMORPHONE 1 MG/ML IV SOLN
INTRAVENOUS | Status: DC
  Administered 2016-09-28: 8.5 mg via INTRAVENOUS
  Administered 2016-09-28: 11:00:00 via INTRAVENOUS
  Filled 2016-09-28: qty 25

## 2016-09-28 MED ORDER — SODIUM CHLORIDE 0.9 % IV SOLN: Freq: Once | INTRAVENOUS | Status: DC

## 2016-09-28 MED ORDER — DEXTROSE-NACL 5-0.45 % IV SOLN
INTRAVENOUS | Status: DC
  Administered 2016-09-28: 11:00:00 via INTRAVENOUS

## 2016-09-28 MED ORDER — HEPARIN SOD (PORK) LOCK FLUSH 100 UNIT/ML IV SOLN
500.0000 [IU] | INTRAVENOUS | Status: DC | PRN
  Administered 2016-09-28: 500 [IU]
  Filled 2016-09-28: qty 5

## 2016-09-28 MED ORDER — SODIUM CHLORIDE 0.9% FLUSH: 10.0000 mL | INTRAVENOUS | Status: DC | PRN

## 2016-09-28 MED ORDER — SODIUM CHLORIDE 0.9 % IV SOLN
25.0000 mg | INTRAVENOUS | Status: DC | PRN
  Filled 2016-09-28: qty 0.5

## 2016-09-28 MED ORDER — ONDANSETRON HCL 4 MG/2ML IJ SOLN
4.0000 mg | Freq: Four times a day (QID) | INTRAMUSCULAR | Status: DC | PRN
  Administered 2016-09-28: 4 mg via INTRAVENOUS
  Filled 2016-09-28: qty 2

## 2016-09-28 NOTE — H&P (Signed)
[facility] History and Physical   Date: 09/28/2016  Patient name: Dawn [name] Medical record number: [id number] Date of birth: 1981-03-24 Age: 36 y.o. Gender: female PCP: Angelica Chessman, MD  Attending physician: Tresa Garter, MD  Chief Complaint: Generalized pain History of Present Illness: [name], a 36 year old female with a history of sickle cell anemia, HbSS presents complaining of generalized pain that is consistent with sickle cell pain. She says that pain started 1 week  ago. She says that pain intensity has been increasing over the past several days. Pain intensity is 9/10 described as constant and throbbing. She last had Oxycodone around 7 am. [name] has been taking pain medications as prescribed without sustained relief. She currently denies headache, blurred vision, shortness of breath, dysuria, nausea, vomiting, or diarrhea. Patient transitioned from primary care office.   Meds: Prescriptions Prior to Admission  Medication Sig Dispense Refill Last Dose  . folic acid (FOLVITE) 1 MG tablet Take 1 tablet (1 mg total) by mouth daily. 30 tablet 11 09/28/2016 at Unknown time  . hydroxyurea (HYDREA) 500 MG capsule Take 2 capsules (1,000 mg total) by mouth daily. May take with food to minimize GI side effects. 60 capsule 11 09/28/2016 at Unknown time  . loratadine (CLARITIN) 10 MG tablet Take 10 mg by mouth daily.   09/28/2016 at Unknown time  . morphine (MS CONTIN) 15 MG 12 hr tablet Take 15 mg by mouth every 12 (twelve) hours.   09/28/2016 at Unknown time  . Multiple Vitamin (MULTIVITAMIN WITH MINERALS) TABS tablet Take 1 tablet by mouth daily.   09/28/2016 at Unknown time  . Oxycodone HCl 10 MG TABS Take 1 tablet (10 mg total) by mouth every 6 (six) hours as needed. 15 tablet 0 09/28/2016 at Unknown time  . promethazine (PHENERGAN) 12.5 MG tablet Take 1 tablet (12.5 mg total) by mouth every 6 (six) hours as needed for nausea or vomiting. 30 tablet 0 09/28/2016 at  Unknown time  . Vitamin D, Ergocalciferol, (DRISDOL) 50000 units CAPS capsule Take 1 capsule (50,000 Units total) by mouth every 7 (seven) days. Takes on Sundays. 4 capsule 11 Past Week at Unknown time  . levonorgestrel (MIRENA) 20 MCG/24HR IUD 1 each by Intrauterine route once.   More than a month at Unknown time  . potassium chloride SA (K-DUR,KLOR-CON) 20 MEQ tablet Take 2 tablets (40 mEq total) by mouth daily. (Patient not taking: Reported on 09/25/2016) 14 tablet 0 Completed Course    Allergies: Desferal [deferoxamine]; Latex; Lisinopril; Tape; and Tylenol [acetaminophen] Past Medical History:  Diagnosis Date  . Blood transfusion   . Demand ischemia ([place]) 02/06/2016  . HCAP (healthcare-associated pneumonia) 05/19/2013  . 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 Pulmonary hypertension 02/06/2016   49 mmHg per echo on 02/06/16   . 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 Sickle cell anemia (HCC)   . Sickle cell disease (Dawn [name])   . Sickle cell disease, type S (Dawn [name])   . Vitamin B12 deficiency 07/07/2015   Past Surgical History:  Procedure Laterality Date  .  left knee ACL reconstruction    . CESAREAN SECTION     x 2  . CHOLECYSTECTOMY    . IR GENERIC HISTORICAL  07/17/2016   IR FLUORO GUIDE PORT INSERTION RIGHT 07/17/2016 Dawn Edouard, MD [facility]  . IR GENERIC HISTORICAL  07/17/2016   IR US GUIDE VASC ACCESS RIGHT 07/17/2016 Dawn Edouard, MD [facility]  . PORT-A-CATH REMOVAL Left 07/29/2013  Procedure: REMOVAL PORT-A-CATH;  Surgeon: Scherry Ran, MD;  Location: [facility];  Service: General;  Laterality: Left;  . PORTACATH PLACEMENT    . PORTACATH PLACEMENT Left 02/23/2013   Procedure: INSERTION PORT-A-CATH;  Surgeon: Donato Heinz, MD;  Location: [facility];  Service: General;  Laterality: Left;  . TEE WITHOUT CARDIOVERSION N/A 08/07/2013   Procedure: TRANSESOPHAGEAL ECHOCARDIOGRAM (TEE);  Surgeon: Arnoldo Lenis, MD;  Location: [facility];  Service: Cardiology;   Laterality: N/A;   Family History  Problem Relation Age of Onset  . Sickle cell trait Mother   . Sickle cell trait Father   . Hypertension Father   . Diabetes Father   . Sickle cell trait Sister   . Cancer Paternal Aunt     Breast   Social History   Social History  . Marital status: Divorced    Spouse name: N/A  . Number of children: N/A  . Years of education: N/A   Occupational History  . Not on file.   Social History Main Topics  . Smoking status: Never Smoker  . Smokeless tobacco: Never Used  . Alcohol use No  . Drug use: No  . Sexual activity: Yes    Birth control/ protection: IUD   Other Topics Concern  . Not on file   Social History Narrative  . No narrative on file    Review of Systems: Constitutional: negative for fatigue, fevers and malaise Eyes: positive for icterus Ears, nose, mouth, throat, and face: negative Respiratory: negative for dyspnea on exertion Cardiovascular: negative for chest pain, dyspnea, lower extremity edema and palpitations Gastrointestinal: negative for nausea and vomiting Genitourinary:negative Integument/breast: negative Hematologic/lymphatic: negative Musculoskeletal:positive for back pain and myalgias Neurological: negative Behavioral/Psych: negative Endocrine: negative Allergic/Immunologic: negative  Physical Exam: Blood pressure 136/75, pulse 94, temperature 98.5 F (36.9 C), temperature source Oral, resp. rate 18, height 5' 3.5" (1.613 m), weight 171 lb (77.6 kg), SpO2 95 %. BP 136/75 (BP Location: Left Arm, Patient Position: Sitting)   Pulse 94   Temp 98.5 F (36.9 C) (Oral)   Resp 18   Ht 5' 3.5" (1.613 m)   Wt 171 lb (77.6 kg)   SpO2 95%   BMI 29.82 kg/m   General Appearance:    Alert, cooperative, no distress, appears stated age  Head:    Normocephalic, without obvious abnormality, atraumatic  Eyes:    PERRL, conjunctiva/corneas clear, EOM's intact, fundi    benign, icterus both eyes  Ears:    Normal  TM's and external ear canals, both ears  Nose:   Nares normal, septum midline, mucosa normal, no drainage    or sinus tenderness  Throat:   Lips, mucosa, and tongue normal; teeth and gums normal  Neck:   Supple, symmetrical, trachea midline, no adenopathy;    thyroid:  no enlargement/tenderness/nodules; no carotid   bruit or JVD  Back:     Symmetric, no curvature, ROM normal, no CVA tenderness  Lungs:     Clear to auscultation bilaterally, respirations unlabored  Chest Wall:    No tenderness or deformity   Heart:    Regular rate and rhythm, S1 and S2 normal, no murmur, rub   or gallop  Abdomen:     Soft, non-tender, bowel sounds active all four quadrants,    no masses, no organomegaly  Extremities:   Extremities normal, atraumatic, no cyanosis or edema  Pulses:   2+ and symmetric all extremities  Skin:   Skin color, texture, turgor normal, no  rashes or lesions  Lymph nodes:   Cervical, supraclavicular, and axillary nodes normal  Neurologic:   CNII-XII intact, normal strength, sensation and reflexes    throughout     Lab results: No results found for this or any previous visit (from the past 24 hour(s)).  Imaging results:  No results found.   Assessment & Plan:  Patient will be admitted to the day infusion center for extended observation  Start IV D5.45 for cellular rehydration at 150 ml/hour  Start Toradol 30 mg IV times one  Start Dilaudid per high concentration PCA  Patient will be re-evaluated for pain intensity in the context of function and relationship to baseline as care progresses.  If no significant pain relief, will transfer patient to inpatient services for a higher level of care.   CBC, CMP, and reticulocytes. Reviewed labs, previous hemoglobin was 7.9   Hollis,Lachina M 09/28/2016, 9:53 AM

## 2016-09-28 NOTE — Progress Notes (Signed)
Pt received to the [facility] for treatment. Pt stated her pain was 9/10 on admission. Stated her pain was in her back and extremities.She was treated with IV fluids, Dilaudid PCA and Toradol. And also heat. Pt's # went down to 5/10 at discharge. Discharge instructions given with verbal understanding.  Pt was alert,oriented and ambulatory at discharge. She was discharge to home with her sister.

## 2016-09-28 NOTE — Discharge Summary (Signed)
[facility] Discharge Summary   Patient ID: NYEMIAH JOY MRN: [id number] DOB/AGE: [birth year] 36 y.o.  Admit date: 09/28/2016 Discharge date: 09/28/2016  Primary Care Physician:  Angelica Chessman, MD  Admission Diagnoses:  Active Problems:   Sickle cell anemia with crisis [facility])  Discharge Medications: No current facility-administered medications on file prior to encounter.    Current Outpatient Prescriptions on File Prior to Encounter  Medication Sig Dispense Refill  . folic acid (FOLVITE) 1 MG tablet Take 1 tablet (1 mg total) by mouth daily. 30 tablet 11  . hydroxyurea (HYDREA) 500 MG capsule Take 2 capsules (1,000 mg total) by mouth daily. May take with food to minimize GI side effects. 60 capsule 11  . loratadine (CLARITIN) 10 MG tablet Take 10 mg by mouth daily.    Marland Kitchen morphine (MS CONTIN) 15 MG 12 hr tablet Take 15 mg by mouth every 12 (twelve) hours.    . Multiple Vitamin (MULTIVITAMIN WITH MINERALS) TABS tablet Take 1 tablet by mouth daily.    . Oxycodone HCl 10 MG TABS Take 1 tablet (10 mg total) by mouth every 6 (six) hours as needed. 15 tablet 0  . promethazine (PHENERGAN) 12.5 MG tablet Take 1 tablet (12.5 mg total) by mouth every 6 (six) hours as needed for nausea or vomiting. 30 tablet 0  . Vitamin D, Ergocalciferol, (DRISDOL) 50000 units CAPS capsule Take 1 capsule (50,000 Units total) by mouth every 7 (seven) days. Takes on Sundays. 4 capsule 11  . levonorgestrel (MIRENA) 20 MCG/24HR IUD 1 each by Intrauterine route once.    . potassium chloride SA (K-DUR,KLOR-CON) 20 MEQ tablet Take 2 tablets (40 mEq total) by mouth daily. (Patient not taking: Reported on 09/25/2016) 14 tablet 0      Consults:  None  Significant Diagnostic Studies:  No results found.   [facility] Course: Ms. Anshu Staal, a 36 year old female with a female of sickle cell anemia was admitted to the day infusion center for pain management.  Hemoglobin is currently 7.4. Type  and screen sent.  Reviewed labs, hemodynamically stable. Patient was started on IV dilaudid per weight based rapid redosing. She used a total of 8.5 mg with 17 demands and 17 deliveries. Pain intensity decreased from 9/10 to 5/10. Patient is alert, oriented, and ambulatory.  Recommend continue medications as previously prescribed. Increase water intake to 64 ounces per day. Follow up in clinic as scheduled.  Patient will discharge home with family in stable condition.   The patient was given clear instructions to go to ER or return to medical center if symptoms do not improve, worsen or new problems develop. The patient verbalized understanding.  Physical Exam at Discharge:   BP 137/88 (BP Location: Left Arm)   Pulse 85   Temp 98.5 F (36.9 C) (Oral)   Resp 15   Ht 5' 3.5" (1.613 m)   Wt 171 lb (77.6 kg)   SpO2 95%   BMI 29.82 kg/m   General Appearance:    Alert, cooperative, no distress, appears stated age  Head:    Normocephalic, without obvious abnormality, atraumatic  Back:     Symmetric, no curvature, ROM normal, no CVA tenderness  Lungs:     Clear to auscultation bilaterally, respirations unlabored  Chest Wall:    No tenderness or deformity   Heart:    Regular rate and rhythm, S1 and S2 normal, no murmur, rub   or gallop  Abdomen:  Soft, non-tender, bowel sounds active all four quadrants,    no masses, no organomegaly  Extremities:   Extremities normal, atraumatic, no cyanosis or edema  Pulses:   2+ and symmetric all extremities  Skin:   Skin color, texture, turgor normal, no rashes or lesions  Lymph nodes:   Cervical, supraclavicular, and axillary nodes normal  Neurologic:   CNII-XII intact, normal strength, sensation and reflexes    throughout    Disposition at Discharge: 06-Home-Health Care Svc  Discharge Orders: Discharge Instructions    Discharge patient    Complete by:  As directed    Discharge disposition:  01-Home or Self Care   Discharge patient date:   09/28/2016      Condition at Discharge:   Stable  Time spent on Discharge:  15 minutes  Signed: Darrow Barreiro M 09/28/2016, 3:01 PM

## 2016-09-28 NOTE — Discharge Instructions (Signed)
Sickle Cell Anemia, Adult °Sickle cell anemia is a condition where your red blood cells are shaped like sickles. Red blood cells carry oxygen through the body. Sickle-shaped red blood cells do not live as long as normal red blood cells. They also clump together and block blood from flowing through the blood vessels. These things prevent the body from getting enough oxygen. Sickle cell anemia causes organ damage and pain. It also increases the risk of infection. °Follow these instructions at home: °· Drink enough fluid to keep your pee (urine) clear or pale yellow. Drink more in hot weather and during exercise. °· Do not smoke. Smoking lowers oxygen levels in the blood. °· Only take over-the-counter or prescription medicines as told by your doctor. °· Take antibiotic medicines as told by your doctor. Make sure you finish them even if you start to feel better. °· Take supplements as told by your doctor. °· Consider wearing a medical alert bracelet. This tells anyone caring for you in an emergency of your condition. °· When traveling, keep your medical information, doctors' names, and the medicines you take with you at all times. °· If you have a fever, do not take fever medicines right away. This could cover up a problem. Tell your doctor. °· Keep all follow-up visits with your doctor. Sickle cell anemia requires regular medical care. °Contact a doctor if: °You have a fever. °Get help right away if: °· You feel dizzy or faint. °· You have new belly (abdominal) pain, especially on the left side near the stomach area. °· You have a lasting, often uncomfortable and painful erection of the penis (priapism). If it is not treated right away, you will become unable to have sex (impotence). °· You have numbness in your arms or legs or you have a hard time moving them. °· You have a hard time talking. °· You have a fever or lasting symptoms for more than 2-3 days. °· You have a fever and your symptoms suddenly get  worse. °· You have signs or symptoms of infection. These include: °? Chills. °? Being more tired than normal (lethargy). °? Irritability. °? Poor eating. °? Throwing up (vomiting). °· You have pain that is not helped with medicine. °· You have shortness of breath. °· You have pain in your chest. °· You are coughing up pus-like or bloody mucus. °· You have a stiff neck. °· Your feet or hands swell or have pain. °· Your belly looks bloated. °· Your joints hurt. °This information is not intended to replace advice given to you by your health care provider. Make sure you discuss any questions you have with your health care provider. °Document Released: 07/01/2013 Document Revised: 02/16/2016 Document Reviewed: 04/22/2013 °Elsevier Interactive Patient Education © 2017 [employer] ° °

## 2016-09-28 NOTE — Telephone Encounter (Signed)
Patient called requesting to come to the [facility] for treatment. Pt states her pain is severe and in her back and extremities.She states her pain is 9/10. She last took her Oxycodone and MS contin at 7am this morning and has been taking her pain meds around the clock as prescribed. She denies fever, chest pain, nausea, vomiting,diarrhea and abdominal pain. Asked patient to hold while I check with the provider, she voiced understanding.  After speaking with the provider, L. Smith Robert, NP, patient was told that she could come in for treatment. Patient stated she could be here in 30 minutes.

## 2016-09-30 ENCOUNTER — Encounter (HOSPITAL_COMMUNITY): Payer: Self-pay

## 2016-09-30 ENCOUNTER — Emergency Department (HOSPITAL_COMMUNITY): Payer: Medicare Other

## 2016-09-30 ENCOUNTER — Emergency Department (HOSPITAL_COMMUNITY)
Admission: EM | Admit: 2016-09-30 | Discharge: 2016-10-01 | Disposition: A | Discharge status: Home / Self Care | Payer: Medicare Other | Source: Home / Self Care | Attending: Emergency Medicine | Admitting: Emergency Medicine

## 2016-09-30 ENCOUNTER — Other Ambulatory Visit: Payer: Self-pay

## 2016-09-30 DIAGNOSIS — Z91040 Latex allergy status: Secondary | ICD-10-CM | POA: Insufficient documentation

## 2016-09-30 DIAGNOSIS — D5700 Hb-SS disease with crisis, unspecified: Secondary | ICD-10-CM

## 2016-09-30 DIAGNOSIS — R0602 Shortness of breath: Secondary | ICD-10-CM | POA: Diagnosis not present

## 2016-09-30 DIAGNOSIS — D696 Thrombocytopenia, unspecified: Secondary | ICD-10-CM | POA: Diagnosis not present

## 2016-09-30 DIAGNOSIS — Z23 Encounter for immunization: Secondary | ICD-10-CM | POA: Diagnosis not present

## 2016-09-30 DIAGNOSIS — I272 Pulmonary hypertension, unspecified: Secondary | ICD-10-CM | POA: Diagnosis not present

## 2016-09-30 DIAGNOSIS — R0902 Hypoxemia: Secondary | ICD-10-CM | POA: Diagnosis not present

## 2016-09-30 DIAGNOSIS — R079 Chest pain, unspecified: Secondary | ICD-10-CM | POA: Diagnosis not present

## 2016-09-30 LAB — CBC WITH DIFFERENTIAL/PLATELET
BASOS PCT: 0 %
Basophils Absolute: 0 10*3/uL (ref 0.0–0.1)
EOS PCT: 1 %
Eosinophils Absolute: 0.1 10*3/uL (ref 0.0–0.7)
HEMATOCRIT: 21 % — AB (ref 36.0–46.0)
HEMOGLOBIN: 7.8 g/dL — AB (ref 12.0–15.0)
LYMPHS PCT: 26 %
Lymphs Abs: 2.3 10*3/uL (ref 0.7–4.0)
MCH: 35.5 pg — AB (ref 26.0–34.0)
MCHC: 37.1 g/dL — ABNORMAL HIGH (ref 30.0–36.0)
MCV: 95.5 fL (ref 78.0–100.0)
MONO ABS: 1.4 10*3/uL — AB (ref 0.1–1.0)
Monocytes Relative: 16 %
NEUTROS PCT: 57 %
Neutro Abs: 5.2 10*3/uL (ref 1.7–7.7)
Platelets: 159 10*3/uL (ref 150–400)
RBC: 2.2 MIL/uL — AB (ref 3.87–5.11)
RDW: 20.6 % — ABNORMAL HIGH (ref 11.5–15.5)
WBC: 9 10*3/uL (ref 4.0–10.5)

## 2016-09-30 LAB — COMPREHENSIVE METABOLIC PANEL
ALT: 28 U/L (ref 14–54)
AST: 86 U/L — ABNORMAL HIGH (ref 15–41)
Albumin: 2.8 g/dL — ABNORMAL LOW (ref 3.5–5.0)
Alkaline Phosphatase: 125 U/L (ref 38–126)
Anion gap: 8 (ref 5–15)
BUN: 6 mg/dL (ref 6–20)
CHLORIDE: 106 mmol/L (ref 101–111)
CO2: 24 mmol/L (ref 22–32)
CREATININE: 0.43 mg/dL — AB (ref 0.44–1.00)
Calcium: 8.6 mg/dL — ABNORMAL LOW (ref 8.9–10.3)
GFR calc non Af Amer: >60 mL/min (ref >60)
Glucose, Bld: 93 mg/dL (ref 65–99)
POTASSIUM: 3.5 mmol/L (ref 3.5–5.1)
SODIUM: 138 mmol/L (ref 135–145)
Total Bilirubin: 15.1 mg/dL — ABNORMAL HIGH (ref 0.3–1.2)
Total Protein: 7.9 g/dL (ref 6.5–8.1)

## 2016-09-30 LAB — RETICULOCYTES
RBC.: 2.2 MIL/uL — ABNORMAL LOW (ref 3.87–5.11)
Retic Ct Pct: >23 % — ABNORMAL HIGH (ref 0.4–3.1)

## 2016-09-30 LAB — TROPONIN I: Troponin I: <0.03 ng/mL (ref <0.03)

## 2016-09-30 MED ORDER — HYDROMORPHONE HCL 2 MG/ML IJ SOLN
2.0000 mg | INTRAMUSCULAR | Status: AC
  Administered 2016-09-30: 2 mg via INTRAVENOUS
  Filled 2016-09-30: qty 1

## 2016-09-30 MED ORDER — HYDROMORPHONE HCL 2 MG/ML IJ SOLN: 2.0000 mg | INTRAMUSCULAR | Status: AC

## 2016-09-30 MED ORDER — ONDANSETRON HCL 4 MG/2ML IJ SOLN
4.0000 mg | Freq: Once | INTRAMUSCULAR | Status: AC
  Administered 2016-09-30: 4 mg via INTRAVENOUS
  Filled 2016-09-30: qty 2

## 2016-09-30 MED ORDER — HEPARIN SOD (PORK) LOCK FLUSH 100 UNIT/ML IV SOLN
INTRAVENOUS | Status: AC
  Administered 2016-10-01
  Filled 2016-09-30: qty 5

## 2016-09-30 MED ORDER — KETOROLAC TROMETHAMINE 30 MG/ML IJ SOLN
30.0000 mg | INTRAMUSCULAR | Status: AC
  Administered 2016-09-30: 30 mg via INTRAVENOUS
  Filled 2016-09-30: qty 1

## 2016-09-30 MED ORDER — HYDROMORPHONE HCL 2 MG/ML IJ SOLN: 2.0000 mg | INTRAMUSCULAR | Status: DC

## 2016-09-30 MED ORDER — DIPHENHYDRAMINE HCL 50 MG/ML IJ SOLN
25.0000 mg | Freq: Once | INTRAMUSCULAR | Status: AC
  Administered 2016-09-30: 25 mg via INTRAVENOUS
  Filled 2016-09-30: qty 1

## 2016-09-30 MED ORDER — PROMETHAZINE HCL 25 MG/ML IJ SOLN
25.0000 mg | Freq: Once | INTRAMUSCULAR | Status: AC
  Administered 2016-09-30: 25 mg via INTRAVENOUS
  Filled 2016-09-30: qty 1

## 2016-09-30 MED ORDER — DEXTROSE-NACL 5-0.45 % IV SOLN
INTRAVENOUS | Status: DC
  Administered 2016-09-30: 18:00:00 via INTRAVENOUS

## 2016-09-30 MED ORDER — HYDROMORPHONE HCL 2 MG/ML IJ SOLN
2.0000 mg | INTRAMUSCULAR | Status: DC
  Administered 2016-09-30 (×2): 2 mg via INTRAVENOUS
  Filled 2016-09-30 (×2): qty 1

## 2016-09-30 NOTE — Discharge Instructions (Signed)
Return if you develop  new or worsening pain , shortness of breath or fever  otherwise follow up with your PCP Make sure to stay hydrated

## 2016-09-30 NOTE — ED Provider Notes (Signed)
[facility] DEPT Provider Note   CSN: [id number] Arrival date & time: 09/30/16  1605    History   Chief Complaint Chief Complaint  Patient presents with  . Sickle Cell Pain Crisis  . Chest Pain    HPI Dawn [name] is a 36 y.o. female who presents with pain. PMH significant for sickle cell disease, chronic pain syndrome. She has pain in her low back, forearms, thighs, as well as chest pain. She states she doesn't always have chest pain but sometimes does and it is typical for chest pain she's had in the past. She states the pain started intensifying yesterday and the pain was severe today. Chest pain started last night. She was seen at the sickle cell clinic on Friday. Pain was controlled when she left although they noted that her hgb was 7.4 and advised transfusion if her pain worsened/continued. Normally transfused at <8. She takes Morphine 15mg  BID and Oxycodone 10mg  prn and has been taking this without relief. Pain is currently 8/10. Has port-a-cath which was placed in Oct. She denies fever, chills, confusion, weakness, cough, wheezing, SOB, abdominal pain, N/V, dysuria.    HPI  Past Medical History:  Diagnosis Date  . Blood transfusion   . Demand ischemia ([place]) 02/06/2016  . HCAP (healthcare-associated pneumonia) 05/19/2013  . 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 Pulmonary hypertension 02/06/2016   49 mmHg per echo on 02/06/16   . 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 Sickle cell anemia (HCC)   . Sickle cell disease ([place])   . Sickle cell disease, type S ([place])   . Vitamin B12 deficiency 07/07/2015    Patient Active Problem List   Diagnosis Date Noted  . Hypoxia   . Positive D dimer   . Pulmonary hypertension 02/16/2016  . 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 Demand ischemia ([place]) 02/13/2016  . Sepsis ([place]) 02/13/2016  . Tachycardia   . Anemia of chronic disease   . Chronic pain syndrome 10/14/2015  . UTI (urinary tract infection) 08/25/2015  . Left atrial  dilatation 07/07/2015  . Vitamin B12 deficiency 07/07/2015  . Acute pulmonary edema ([place]) 07/06/2015  . Thrombocytopenia ([place]) 07/06/2015  . Acute respiratory failure with hypoxia ([place]) 09/12/2014  . Hyperbilirubinemia 09/11/2014  . Sickle cell disease with crisis ([place]) 09/09/2014  . Anxiety state 06/15/2014  . Nosebleed 06/15/2014  . Macrocytosis 06/13/2014  . Vitamin D deficiency 11/23/2013  . Abnormal chest CT 08/07/2013  . Fungemia 07/28/2013  . 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 Hypoxemia 05/14/2013  . 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 Hypokalemia 03/28/2012  . Hypoalbuminemia 03/28/2012  . 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 Leukocytosis 05/26/2011  . Elevated LFTs 04/27/2011  . Sickle cell disease ([place]) 04/26/2011    Past Surgical History:  Procedure Laterality Date  .  left knee ACL reconstruction    . CESAREAN SECTION     x 2  . CHOLECYSTECTOMY    . IR GENERIC HISTORICAL  07/17/2016   IR FLUORO GUIDE PORT INSERTION RIGHT 07/17/2016 Aletta Edouard, MD [facility]-INTERV RAD  . IR GENERIC HISTORICAL  07/17/2016   IR US GUIDE VASC ACCESS RIGHT 07/17/2016 Aletta Edouard, MD [facility]-INTERV RAD  . PORT-A-CATH REMOVAL Left 07/29/2013   Procedure: REMOVAL PORT-A-CATH;  Surgeon: Scherry Ran, MD;  Location: [facility] ORS;  Service: General;  Laterality: Left;  . PORTACATH PLACEMENT    . PORTACATH PLACEMENT Left 02/23/2013   Procedure: INSERTION PORT-A-CATH;  Surgeon: Donato Heinz, MD;  Location: [facility]  ORS;  Service: General;  Laterality: Left;  . TEE WITHOUT CARDIOVERSION N/A 08/07/2013   Procedure: TRANSESOPHAGEAL ECHOCARDIOGRAM (TEE);  Surgeon: Arnoldo Lenis, MD;  Location: [facility] ENDO SUITE;  Service: Cardiology;  Laterality: N/A;    OB History    Gravida Para Term Preterm AB Living   1 1   1   1    SAB TAB Ectopic Multiple Live Births                   Home Medications    Prior to Admission medications   Medication Sig  Start Date End Date Taking? Authorizing Provider  folic acid (FOLVITE) 1 MG tablet Take 1 tablet (1 mg total) by mouth daily. 03/14/16  Yes [name], FNP  hydroxyurea (HYDREA) 500 MG capsule Take 2 capsules (1,000 mg total) by mouth daily. May take with food to minimize GI side effects. 03/14/16  Yes [name], FNP  levonorgestrel (MIRENA) 20 MCG/24HR IUD 1 each by Intrauterine route once.   Yes Historical Provider, MD  loratadine (CLARITIN) 10 MG tablet Take 10 mg by mouth daily.   Yes Historical Provider, MD  morphine (MS CONTIN) 15 MG 12 hr tablet Take 15 mg by mouth every 12 (twelve) hours.   Yes Historical Provider, MD  Multiple Vitamin (MULTIVITAMIN WITH MINERALS) TABS tablet Take 1 tablet by mouth daily.   Yes Historical Provider, MD  Oxycodone HCl 10 MG TABS Take 1 tablet (10 mg total) by mouth every 6 (six) hours as needed. 09/25/16  Yes Kinnie Feil, PA-C  promethazine (PHENERGAN) 12.5 MG tablet Take 1 tablet (12.5 mg total) by mouth every 6 (six) hours as needed for nausea or vomiting. 09/25/16  Yes Kinnie Feil, PA-C  Vitamin D, Ergocalciferol, (DRISDOL) 50000 units CAPS capsule Take 1 capsule (50,000 Units total) by mouth every 7 (seven) days. Takes on Sundays. 06/01/16  Yes Micheline Chapman, NP    Family History Family History  Problem Relation Age of Onset  . Sickle cell trait Mother   . Sickle cell trait Father   . Hypertension Father   . Diabetes Father   . Sickle cell trait Sister   . Cancer Paternal Aunt     Breast    Social History Social History  Substance Use Topics  . Smoking status: Never Smoker  . Smokeless tobacco: Never Used  . Alcohol use No     Allergies   Desferal [deferoxamine]; Latex; Lisinopril; Tape; and Tylenol [acetaminophen]   Review of Systems Review of Systems  Constitutional: Negative for chills and fever.  Respiratory: Negative for cough, shortness of breath and wheezing.   Cardiovascular: Positive for chest pain.    Gastrointestinal: Negative for abdominal pain, nausea and vomiting.  Genitourinary: Negative for dysuria.  Musculoskeletal: Positive for arthralgias, back pain and myalgias. Negative for gait problem.  All other systems reviewed and are negative.   Physical Exam Updated Vital Signs BP 140/83 (BP Location: Left Arm)   Pulse 99   Temp 98.3 F (36.8 C) (Oral)   Resp 16   Ht 5\' 3"  (1.6 m)   Wt 73.1 kg   SpO2 93%   BMI 28.54 kg/m   Physical Exam  Constitutional: She is oriented to person, place, and time. She appears well-developed and well-nourished. No distress.  Pleasant, NAD  HENT:  Head: Normocephalic and atraumatic.  Eyes: Conjunctivae are normal. Pupils are equal, round, and reactive to light. Right eye exhibits no discharge. Left eye exhibits no  discharge. No scleral icterus.  Neck: Normal range of motion.  Cardiovascular: Normal rate and regular rhythm.  Exam reveals no gallop and no friction rub.   No murmur heard. Pulmonary/Chest: Effort normal and breath sounds normal. No respiratory distress. She has no wheezes. She has no rales. She exhibits no tenderness.  Abdominal: Soft. Bowel sounds are normal. She exhibits no distension and no mass. There is no tenderness. There is no rebound and no guarding. No hernia.  Musculoskeletal:  Generalized tenderness to low back, forearms, and thighs bilaterally  Neurological: She is alert and oriented to person, place, and time.  Lying on stretcher in NAD. GCS 15. Speaks in a clear voice. Cranial nerves II through XII grossly intact. 5/5 strength in all extremities. Sensation fully intact.  Normal gait  Skin: Skin is warm and dry.  Psychiatric: She has a normal mood and affect. Her behavior is normal.  Nursing note and vitals reviewed.    ED Treatments / Results  Labs (all labs ordered are listed, but only abnormal results are displayed) Labs Reviewed  COMPREHENSIVE METABOLIC PANEL - Abnormal; Notable for the following:        Result Value   Creatinine, Ser 0.43 (*)    Calcium 8.6 (*)    Albumin 2.8 (*)    AST 86 (*)    Total Bilirubin 15.1 (*)    All other components within normal limits  CBC WITH DIFFERENTIAL/PLATELET - Abnormal; Notable for the following:    RBC 2.20 (*)    Hemoglobin 7.8 (*)    HCT 21.0 (*)    MCH 35.5 (*)    MCHC 37.1 (*)    RDW 20.6 (*)    Monocytes Absolute 1.4 (*)    All other components within normal limits  RETICULOCYTES - Abnormal; Notable for the following:    Retic Ct Pct >23.0 (*)    RBC. 2.20 (*)    All other components within normal limits  TROPONIN I    EKG  EKG Interpretation None       Radiology Dg Chest 2 View  Result Date: 09/30/2016 CLINICAL DATA:  36 year old with current history of sickle cell disease, presenting with 2 week history of intermittent chest pain which acutely worsened today. EXAM: CHEST  2 VIEW COMPARISON:  08/03/2016, 04/28/2016 and earlier, including CTA chest 08/25/2015. FINDINGS: Cardiac silhouette upper normal in size to slightly enlarged, unchanged. Hilar and mediastinal contours otherwise unremarkable. Right jugular Port-A-Cath tip remains in the lower SVC, unchanged. Lungs clear. Bronchovascular markings normal. Pulmonary vascularity normal. No visible pleural effusions. No pneumothorax. Visualized bony thorax intact. IMPRESSION: Stable borderline heart size.  No acute cardiopulmonary disease. Electronically Signed   By: Evangeline Dakin M.D.   On: 09/30/2016 18:01    Procedures Procedures (including critical care time)  Medications Ordered in ED Medications  ketorolac (TORADOL) 30 MG/ML injection 30 mg (30 mg Intravenous Given 09/30/16 1805)  HYDROmorphone (DILAUDID) injection 2 mg (2 mg Intravenous Given 09/30/16 1806)    Or  HYDROmorphone (DILAUDID) injection 2 mg ( Subcutaneous See Alternative 09/30/16 1806)  HYDROmorphone (DILAUDID) injection 2 mg (2 mg Intravenous Given 09/30/16 1849)    Or  HYDROmorphone (DILAUDID) injection 2 mg (  Subcutaneous See Alternative 09/30/16 1849)  HYDROmorphone (DILAUDID) injection 2 mg (2 mg Intravenous Given 09/30/16 1925)    Or  HYDROmorphone (DILAUDID) injection 2 mg ( Subcutaneous See Alternative 09/30/16 1925)  ondansetron (ZOFRAN) injection 4 mg (4 mg Intravenous Given 09/30/16 1821)  promethazine (PHENERGAN) injection 25  mg (25 mg Intravenous Given 09/30/16 1855)  diphenhydrAMINE (BENADRYL) injection 25 mg (25 mg Intravenous Given 09/30/16 2055)  heparin lock flush 100 UNIT/ML injection (  Given 10/01/16 0006)     Initial Impression / Assessment and Plan / ED Course  I have reviewed the triage vital signs and the nursing notes.  Pertinent labs & imaging results that were available during my care of the patient were reviewed by me and considered in my medical decision making (see chart for details).  Clinical Course    36 year old with sickle cell pain crisis. Vitals are normal except that she is hypoxic to 89% which she states happens with her pain crises. Placed on 2L O2 via  and sat improved to 98%. Labs drawn. Phenergan and Benadryl given.    Recheck: Pain is 6/10. Hgb is 7.8, improved from 3 days ago. Otherwise labs are at baseline. Will take her off O2 and see how sats do. CXR negative. EKG is SR.  Recheck: Pain improved 4/10 after 4 rounds of pain meds. Will ambulate patient in hall to make sure she doesn't become more hypoxic. Will sign patient out to Lissa Hoard at shift change who will determine dispo. Anticipate d/c.   Final Clinical Impressions(s) / ED Diagnoses   Final diagnoses:  Sickle cell pain crisis [facility])    New Prescriptions New Prescriptions   No medications on file     Recardo Evangelist, PA-C 10/01/16 [place], MD 10/01/16 1323

## 2016-09-30 NOTE — ED Provider Notes (Signed)
Patient state she is feeling better, does not need any medication refilled and will follow up in clinic    Junius Creamer, NP 09/30/16 [place], MD 10/01/16 [phone]

## 2016-09-30 NOTE — ED Triage Notes (Signed)
Pt at day clinic for sickle cell Friday. Told hbg low and if not improved to come to ED.  Pt states everything hurts including chest.  Pain x 2 weeks.

## 2016-09-30 NOTE — ED Notes (Signed)
ED Provider at bedside. 

## 2016-09-30 NOTE — ED Notes (Signed)
Patient request lab draw from PORT. 

## 2016-09-30 NOTE — ED Notes (Signed)
Patient transported to X-ray 

## 2016-09-30 NOTE — ED Notes (Signed)
Reminded pt about urine specimen and is still unable urinate

## 2016-09-30 NOTE — ED Notes (Signed)
RN accessing pt port

## 2016-09-30 NOTE — ED Notes (Signed)
Bed: WA11 Expected date:  Expected time:  Means of arrival:  Comments: TR2 

## 2016-09-30 NOTE — ED Notes (Signed)
Pt is aware that a urine sample is needed but is unable to obtain one at this time. 

## 2016-10-01 NOTE — ED Notes (Signed)
Patient was alert, oriented and stable upon discharge. RN went over AVS and patient had no further questions.  

## 2016-10-02 ENCOUNTER — Emergency Department (HOSPITAL_COMMUNITY): Payer: Medicare Other

## 2016-10-02 ENCOUNTER — Encounter (HOSPITAL_COMMUNITY): Payer: Self-pay | Admitting: Emergency Medicine

## 2016-10-02 ENCOUNTER — Inpatient Hospital Stay (HOSPITAL_COMMUNITY)
Admission: EM | Admit: 2016-10-02 | Discharge: 2016-10-17 | DRG: 812 | Disposition: A | Discharge status: Home / Self Care | Payer: Medicare Other | Attending: Internal Medicine | Admitting: Internal Medicine

## 2016-10-02 DIAGNOSIS — R0902 Hypoxemia: Secondary | ICD-10-CM | POA: Diagnosis present

## 2016-10-02 DIAGNOSIS — M7989 Other specified soft tissue disorders: Secondary | ICD-10-CM

## 2016-10-02 DIAGNOSIS — D638 Anemia in other chronic diseases classified elsewhere: Secondary | ICD-10-CM | POA: Diagnosis present

## 2016-10-02 DIAGNOSIS — D696 Thrombocytopenia, unspecified: Secondary | ICD-10-CM | POA: Diagnosis present

## 2016-10-02 DIAGNOSIS — R0602 Shortness of breath: Secondary | ICD-10-CM | POA: Diagnosis not present

## 2016-10-02 DIAGNOSIS — Z888 Allergy status to other drugs, medicaments and biological substances status: Secondary | ICD-10-CM

## 2016-10-02 DIAGNOSIS — Z8249 Family history of ischemic heart disease and other diseases of the circulatory system: Secondary | ICD-10-CM

## 2016-10-02 DIAGNOSIS — Z886 Allergy status to analgesic agent status: Secondary | ICD-10-CM

## 2016-10-02 DIAGNOSIS — Z91048 Other nonmedicinal substance allergy status: Secondary | ICD-10-CM

## 2016-10-02 DIAGNOSIS — Z9049 Acquired absence of other specified parts of digestive tract: Secondary | ICD-10-CM

## 2016-10-02 DIAGNOSIS — E876 Hypokalemia: Secondary | ICD-10-CM | POA: Diagnosis not present

## 2016-10-02 DIAGNOSIS — D5700 Hb-SS disease with crisis, unspecified: Principal | ICD-10-CM

## 2016-10-02 DIAGNOSIS — Z803 Family history of malignant neoplasm of breast: Secondary | ICD-10-CM

## 2016-10-02 DIAGNOSIS — R079 Chest pain, unspecified: Secondary | ICD-10-CM | POA: Diagnosis present

## 2016-10-02 DIAGNOSIS — R55 Syncope and collapse: Secondary | ICD-10-CM | POA: Diagnosis not present

## 2016-10-02 DIAGNOSIS — R42 Dizziness and giddiness: Secondary | ICD-10-CM

## 2016-10-02 DIAGNOSIS — D571 Sickle-cell disease without crisis: Secondary | ICD-10-CM

## 2016-10-02 DIAGNOSIS — Z23 Encounter for immunization: Secondary | ICD-10-CM

## 2016-10-02 DIAGNOSIS — Z91040 Latex allergy status: Secondary | ICD-10-CM

## 2016-10-02 DIAGNOSIS — I272 Pulmonary hypertension, unspecified: Secondary | ICD-10-CM | POA: Diagnosis present

## 2016-10-02 DIAGNOSIS — Z833 Family history of diabetes mellitus: Secondary | ICD-10-CM

## 2016-10-02 DIAGNOSIS — Z86711 Personal history of pulmonary embolism: Secondary | ICD-10-CM

## 2016-10-02 DIAGNOSIS — Z832 Family history of diseases of the blood and blood-forming organs and certain disorders involving the immune mechanism: Secondary | ICD-10-CM

## 2016-10-02 DIAGNOSIS — E538 Deficiency of other specified B group vitamins: Secondary | ICD-10-CM | POA: Diagnosis present

## 2016-10-02 DIAGNOSIS — I871 Compression of vein: Secondary | ICD-10-CM | POA: Diagnosis present

## 2016-10-02 LAB — CBC WITH DIFFERENTIAL/PLATELET
Basophils Absolute: 0 10*3/uL (ref 0.0–0.1)
Basophils Relative: 0 %
EOS ABS: 0.1 10*3/uL (ref 0.0–0.7)
EOS PCT: 1 %
HEMATOCRIT: 20 % — AB (ref 36.0–46.0)
Hemoglobin: 7.4 g/dL — ABNORMAL LOW (ref 12.0–15.0)
LYMPHS ABS: 2.7 10*3/uL (ref 0.7–4.0)
Lymphocytes Relative: 26 %
MCH: 35.6 pg — AB (ref 26.0–34.0)
MCHC: 37 g/dL — AB (ref 30.0–36.0)
MCV: 96.2 fL (ref 78.0–100.0)
MONOS PCT: 18 %
Monocytes Absolute: 1.9 10*3/uL — ABNORMAL HIGH (ref 0.1–1.0)
Neutro Abs: 5.5 10*3/uL (ref 1.7–7.7)
Neutrophils Relative %: 54 %
Platelets: 148 10*3/uL — ABNORMAL LOW (ref 150–400)
RBC: 2.08 MIL/uL — ABNORMAL LOW (ref 3.87–5.11)
RDW: 21.5 % — AB (ref 11.5–15.5)
WBC: 10.2 10*3/uL (ref 4.0–10.5)

## 2016-10-02 LAB — RETICULOCYTES
RBC.: 2.08 MIL/uL — ABNORMAL LOW (ref 3.87–5.11)
Retic Ct Pct: >23 % — ABNORMAL HIGH (ref 0.4–3.1)

## 2016-10-02 LAB — COMPREHENSIVE METABOLIC PANEL
ALT: 27 U/L (ref 14–54)
AST: 86 U/L — AB (ref 15–41)
Albumin: 2.7 g/dL — ABNORMAL LOW (ref 3.5–5.0)
Alkaline Phosphatase: 113 U/L (ref 38–126)
Anion gap: 6 (ref 5–15)
BUN: 5 mg/dL — AB (ref 6–20)
CHLORIDE: 105 mmol/L (ref 101–111)
CO2: 25 mmol/L (ref 22–32)
CREATININE: 0.52 mg/dL (ref 0.44–1.00)
Calcium: 8.4 mg/dL — ABNORMAL LOW (ref 8.9–10.3)
GFR calc Af Amer: >60 mL/min (ref >60)
Glucose, Bld: 109 mg/dL — ABNORMAL HIGH (ref 65–99)
Potassium: 3.1 mmol/L — ABNORMAL LOW (ref 3.5–5.1)
Sodium: 136 mmol/L (ref 135–145)
Total Bilirubin: 15.4 mg/dL — ABNORMAL HIGH (ref 0.3–1.2)
Total Protein: 7.6 g/dL (ref 6.5–8.1)

## 2016-10-02 MED ORDER — HYDROMORPHONE HCL 2 MG/ML IJ SOLN
2.0000 mg | INTRAMUSCULAR | Status: DC
  Filled 2016-10-02: qty 1

## 2016-10-02 MED ORDER — HYDROMORPHONE HCL 2 MG/ML IJ SOLN
2.0000 mg | INTRAMUSCULAR | Status: AC
  Administered 2016-10-02: 2 mg via INTRAVENOUS
  Filled 2016-10-02: qty 1

## 2016-10-02 MED ORDER — HYDROMORPHONE HCL 2 MG/ML IJ SOLN: 2.0000 mg | INTRAMUSCULAR | Status: AC

## 2016-10-02 MED ORDER — PROMETHAZINE HCL 25 MG/ML IJ SOLN
12.5000 mg | Freq: Once | INTRAMUSCULAR | Status: AC
  Administered 2016-10-02: 12.5 mg via INTRAVENOUS
  Filled 2016-10-02: qty 1

## 2016-10-02 MED ORDER — HYDROMORPHONE HCL 1 MG/ML IJ SOLN
0.5000 mg | Freq: Once | INTRAMUSCULAR | Status: AC
  Administered 2016-10-02: 0.5 mg via SUBCUTANEOUS
  Filled 2016-10-02: qty 1

## 2016-10-02 MED ORDER — HYDROMORPHONE HCL 2 MG/ML IJ SOLN
2.0000 mg | INTRAMUSCULAR | Status: DC
  Administered 2016-10-02 – 2016-10-03 (×7): 2 mg via INTRAVENOUS
  Filled 2016-10-02 (×6): qty 1

## 2016-10-02 MED ORDER — KETOROLAC TROMETHAMINE 30 MG/ML IJ SOLN
30.0000 mg | INTRAMUSCULAR | Status: AC
Start: 2016-10-02 — End: 2016-10-02
  Administered 2016-10-02: 30 mg via INTRAVENOUS
  Filled 2016-10-02: qty 1

## 2016-10-02 MED ORDER — ONDANSETRON HCL 4 MG/2ML IJ SOLN
4.0000 mg | INTRAMUSCULAR | Status: DC | PRN
  Administered 2016-10-02: 4 mg via INTRAVENOUS
  Filled 2016-10-02: qty 2

## 2016-10-02 MED ORDER — DIPHENHYDRAMINE HCL 25 MG PO CAPS
25.0000 mg | ORAL_CAPSULE | ORAL | Status: DC | PRN
  Administered 2016-10-03: 25 mg via ORAL
  Filled 2016-10-02: qty 1

## 2016-10-02 NOTE — ED Notes (Signed)
Pt requested to have port accessed.

## 2016-10-02 NOTE — ED Provider Notes (Signed)
[place] DEPT Provider Note   CSN: [id number] Arrival date & time: 10/02/16  1742     History   Chief Complaint Chief Complaint  Patient presents with  . Sickle Cell Pain Crisis  . Chest Pain  . Shortness of Breath    HPI Dawn [name] is a 36 y.o. female.   Sickle Cell Pain Crisis  Location:  Chest and abdomen Severity:  Mild Duration:  3 days Similar to previous crisis episodes: yes   Timing:  Constant Chronicity:  Recurrent Usual hemoglobin level:  7's Frequency of attacks:  Often History of pulmonary emboli: yes   Context: cold exposure   Context: not infection   Ineffective treatments:  Fluids Associated symptoms: no chest pain, no congestion, no fever, no headaches, no leg ulcers and no shortness of breath     Past Medical History:  Diagnosis Date  . Blood transfusion   . Demand ischemia ([place]) 02/06/2016  . HCAP (healthcare-associated pneumonia) 05/19/2013  . 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 Pulmonary hypertension 02/06/2016   49 mmHg per echo on 02/06/16   . 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 Sickle cell anemia (HCC)   . Sickle cell disease ([place])   . 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 Vitamin B12 deficiency 07/07/2015    Patient Active Problem List   Diagnosis Date Noted  . Sickle cell pain crisis ([place]) 10/02/2016  . Hypoxia   . Positive D dimer   . Pulmonary hypertension 02/16/2016  . 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 Demand ischemia ([place]) 02/13/2016  . Sepsis ([place]) 02/13/2016  . Tachycardia   . Anemia of chronic disease   . Chronic pain syndrome 10/14/2015  . UTI (urinary tract infection) 08/25/2015  . 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 Vitamin B12 deficiency 07/07/2015  . Acute pulmonary edema ([place]) 07/06/2015  . Thrombocytopenia ([place]) 07/06/2015  . Acute respiratory failure with hypoxia ([place]) 09/12/2014  . Hyperbilirubinemia 09/11/2014  . Sickle cell disease with crisis ([place]) 09/09/2014  . Anxiety state  06/15/2014  . Nosebleed 06/15/2014  . Macrocytosis 06/13/2014  . Vitamin D deficiency 11/23/2013  . Abnormal chest CT 08/07/2013  . Fungemia 07/28/2013  . 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 Hypoxemia 05/14/2013  . 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 Hypokalemia 03/28/2012  . Hypoalbuminemia 03/28/2012  . 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 Leukocytosis 05/26/2011  . Elevated LFTs 04/27/2011  . Sickle cell disease ([place]) 04/26/2011    Past Surgical History:  Procedure Laterality Date  .  left knee ACL reconstruction    . CESAREAN SECTION     x 2  . CHOLECYSTECTOMY    . IR GENERIC HISTORICAL  07/17/2016   IR FLUORO GUIDE PORT INSERTION RIGHT 07/17/2016 Aletta Edouard, MD [facility]  . IR GENERIC HISTORICAL  07/17/2016   IR US GUIDE VASC ACCESS RIGHT 07/17/2016 Aletta Edouard, MD [facility]  . PORT-A-CATH REMOVAL Left 07/29/2013   Procedure: REMOVAL PORT-A-CATH;  Surgeon: Scherry Ran, MD;  Location: [facility];  Service: General;  Laterality: Left;  . PORTACATH PLACEMENT    . PORTACATH PLACEMENT Left 02/23/2013   Procedure: INSERTION PORT-A-CATH;  Surgeon: Donato Heinz, MD;  Location: [facility];  Service: General;  Laterality: Left;  . TEE WITHOUT CARDIOVERSION N/A 08/07/2013   Procedure: TRANSESOPHAGEAL ECHOCARDIOGRAM (TEE);  Surgeon: Arnoldo Lenis, MD;  Location: [facility];  Service: Cardiology;  Laterality: N/A;    OB History    Gravida Para  Term Preterm AB Living   1 1   1   1    SAB TAB Ectopic Multiple Live Births                   Home Medications    Prior to Admission medications   Medication Sig Start Date End Date Taking? Authorizing Provider  folic acid (FOLVITE) 1 MG tablet Take 1 tablet (1 mg total) by mouth daily. 03/14/16  Yes [name], FNP  hydroxyurea (HYDREA) 500 MG capsule Take 2 capsules (1,000 mg total) by mouth daily. May take with food to minimize GI side effects. 03/14/16   Yes [name], FNP  loratadine (CLARITIN) 10 MG tablet Take 10 mg by mouth daily.   Yes Historical Provider, MD  morphine (MS CONTIN) 15 MG 12 hr tablet Take 15 mg by mouth every 12 (twelve) hours.   Yes Historical Provider, MD  Multiple Vitamin (MULTIVITAMIN WITH MINERALS) TABS tablet Take 1 tablet by mouth daily.   Yes Historical Provider, MD  Oxycodone HCl 10 MG TABS Take 1 tablet (10 mg total) by mouth every 6 (six) hours as needed. 09/25/16  Yes Kinnie Feil, PA-C  promethazine (PHENERGAN) 12.5 MG tablet Take 1 tablet (12.5 mg total) by mouth every 6 (six) hours as needed for nausea or vomiting. 09/25/16  Yes Kinnie Feil, PA-C  Vitamin D, Ergocalciferol, (DRISDOL) 50000 units CAPS capsule Take 1 capsule (50,000 Units total) by mouth every 7 (seven) days. Takes on Sundays. 06/01/16  Yes Micheline Chapman, NP  levonorgestrel (MIRENA) 20 MCG/24HR IUD 1 each by Intrauterine route once.    Historical Provider, MD    Family History Family History  Problem Relation Age of Onset  . Sickle cell trait Mother   . Sickle cell trait Father   . Hypertension Father   . Diabetes Father   . Sickle cell trait Sister   . Cancer Paternal Aunt     Breast    Social History Social History  Substance Use Topics  . Smoking status: Never Smoker  . Smokeless tobacco: Never Used  . Alcohol use No     Allergies   Desferal [deferoxamine]; Latex; Lisinopril; Tape; and Tylenol [acetaminophen]   Review of Systems Review of Systems  Constitutional: Negative for fever.  HENT: Negative for congestion.   Eyes: Negative for pain.  Respiratory: Negative for shortness of breath.   Cardiovascular: Negative for chest pain.  Neurological: Negative for headaches.  All other systems reviewed and are negative.    Physical Exam Updated Vital Signs BP 138/82   Pulse 88   Temp 98.7 F (37.1 C) (Oral)   Resp 20   Wt 161 lb (73 kg)   SpO2 94%   BMI 28.52 kg/m   Physical Exam    Constitutional: She appears well-developed and well-nourished.  HENT:  Head: Normocephalic and atraumatic.  Eyes: Conjunctivae and EOM are normal.  Neck: Normal range of motion.  Cardiovascular: Normal rate and regular rhythm.   Pulmonary/Chest: No stridor. No respiratory distress.  Abdominal: Soft. She exhibits no distension.  Neurological: She is alert.  Skin: Skin is warm and dry. No erythema. No pallor.  Nursing note and vitals reviewed.    ED Treatments / Results  Labs (all labs ordered are listed, but only abnormal results are displayed) Labs Reviewed  COMPREHENSIVE METABOLIC PANEL - Abnormal; Notable for the following:       Result Value   Potassium 3.1 (*)    Glucose,  Bld 109 (*)    BUN 5 (*)    Calcium 8.4 (*)    Albumin 2.7 (*)    AST 86 (*)    Total Bilirubin 15.4 (*)    All other components within normal limits  CBC WITH DIFFERENTIAL/PLATELET - Abnormal; Notable for the following:    RBC 2.08 (*)    Hemoglobin 7.4 (*)    HCT 20.0 (*)    MCH 35.6 (*)    MCHC 37.0 (*)    RDW 21.5 (*)    Platelets 148 (*)    Monocytes Absolute 1.9 (*)    All other components within normal limits  RETICULOCYTES - Abnormal; Notable for the following:    Retic Ct Pct >23.0 (*)    RBC. 2.08 (*)    All other components within normal limits    EKG  EKG Interpretation  Date/Time:  Tuesday October 02 2016 18:01:25 EST Ventricular Rate:  95 PR Interval:    QRS Duration: 95 QT Interval:  365 QTC Calculation: 459 R Axis:   58 Text Interpretation:  Sinus rhythm Inferior infarct, age indeterminate inferior Q waves not new Confirmed by [facility] MD, Gabriele Loveland [phone]) on 10/02/2016 8:40:37 PM       Radiology Dg Chest 2 View  Result Date: 10/02/2016 CLINICAL DATA:  Sickle-cell crisis.  Short of breath. EXAM: CHEST  2 VIEW COMPARISON:  09/30/2016 FINDINGS: Mild cardiac enlargement. Lungs are clear without infiltrate effusion or edema. Port-A-Cath tip in the lower SVC unchanged. No change  from the prior study. IMPRESSION: No active cardiopulmonary disease. Electronically Signed   By: Franchot Gallo M.D.   On: 10/02/2016 19:09    Procedures Procedures (including critical care time)  Medications Ordered in ED Medications  diphenhydrAMINE (BENADRYL) capsule 25-50 mg (25 mg Oral Given 10/03/16 0010)  ondansetron (ZOFRAN) injection 4 mg (4 mg Intravenous Given 10/02/16 2028)  HYDROmorphone (DILAUDID) injection 2 mg (2 mg Intravenous Given 10/03/16 0048)    Or  HYDROmorphone (DILAUDID) injection 2 mg ( Subcutaneous See Alternative 10/03/16 0048)  HYDROmorphone (DILAUDID) injection 0.5 mg (0.5 mg Subcutaneous Given 10/02/16 1827)  ketorolac (TORADOL) 30 MG/ML injection 30 mg (30 mg Intravenous Given 10/02/16 2043)  HYDROmorphone (DILAUDID) injection 2 mg (2 mg Intravenous Given 10/02/16 2028)    Or  HYDROmorphone (DILAUDID) injection 2 mg ( Subcutaneous See Alternative 10/02/16 2028)  HYDROmorphone (DILAUDID) injection 2 mg (2 mg Intravenous Given 10/02/16 2123)    Or  HYDROmorphone (DILAUDID) injection 2 mg ( Subcutaneous See Alternative 10/02/16 2123)  HYDROmorphone (DILAUDID) injection 2 mg (2 mg Intravenous Given 10/02/16 2158)    Or  HYDROmorphone (DILAUDID) injection 2 mg ( Subcutaneous See Alternative 10/02/16 2158)  promethazine (PHENERGAN) injection 12.5 mg (12.5 mg Intravenous Given 10/02/16 2134)     Initial Impression / Assessment and Plan / ED Course  I have reviewed the triage vital signs and the nursing notes.  Pertinent labs & imaging results that were available during my care of the patient were reviewed by me and considered in my medical decision making (see chart for details).  Clinical Course     Likely typical sickle cell pain crisis. Pain not resolved after >4 doses of medications in ED. Plan for admission for further pain control.   Final Clinical Impressions(s) / ED Diagnoses   Final diagnoses:  Sickle cell pain crisis [facility])    New Prescriptions New  Prescriptions   No medications on file     Merrily Pew, MD 10/03/16 0107

## 2016-10-02 NOTE — ED Notes (Signed)
Pt weight in kg 73.[place]

## 2016-10-02 NOTE — ED Triage Notes (Signed)
Pt reports sickle cell crisis, chest pain and shortness of breath , right leg pain .

## 2016-10-03 ENCOUNTER — Observation Stay (HOSPITAL_COMMUNITY): Payer: Medicare Other

## 2016-10-03 ENCOUNTER — Encounter (HOSPITAL_COMMUNITY): Payer: Self-pay | Admitting: Internal Medicine

## 2016-10-03 DIAGNOSIS — Z888 Allergy status to other drugs, medicaments and biological substances status: Secondary | ICD-10-CM | POA: Diagnosis not present

## 2016-10-03 DIAGNOSIS — Z9049 Acquired absence of other specified parts of digestive tract: Secondary | ICD-10-CM | POA: Diagnosis not present

## 2016-10-03 DIAGNOSIS — Z91048 Other nonmedicinal substance allergy status: Secondary | ICD-10-CM | POA: Diagnosis not present

## 2016-10-03 DIAGNOSIS — Z803 Family history of malignant neoplasm of breast: Secondary | ICD-10-CM | POA: Diagnosis not present

## 2016-10-03 DIAGNOSIS — D638 Anemia in other chronic diseases classified elsewhere: Secondary | ICD-10-CM | POA: Diagnosis not present

## 2016-10-03 DIAGNOSIS — R42 Dizziness and giddiness: Secondary | ICD-10-CM | POA: Diagnosis not present

## 2016-10-03 DIAGNOSIS — Z23 Encounter for immunization: Secondary | ICD-10-CM | POA: Diagnosis not present

## 2016-10-03 DIAGNOSIS — D5700 Hb-SS disease with crisis, unspecified: Secondary | ICD-10-CM | POA: Diagnosis not present

## 2016-10-03 DIAGNOSIS — I272 Pulmonary hypertension, unspecified: Secondary | ICD-10-CM | POA: Diagnosis present

## 2016-10-03 DIAGNOSIS — E876 Hypokalemia: Secondary | ICD-10-CM | POA: Diagnosis not present

## 2016-10-03 DIAGNOSIS — R079 Chest pain, unspecified: Secondary | ICD-10-CM | POA: Diagnosis not present

## 2016-10-03 DIAGNOSIS — R55 Syncope and collapse: Secondary | ICD-10-CM | POA: Diagnosis not present

## 2016-10-03 DIAGNOSIS — M79609 Pain in unspecified limb: Secondary | ICD-10-CM | POA: Diagnosis not present

## 2016-10-03 DIAGNOSIS — I871 Compression of vein: Secondary | ICD-10-CM | POA: Diagnosis not present

## 2016-10-03 DIAGNOSIS — G894 Chronic pain syndrome: Secondary | ICD-10-CM | POA: Diagnosis not present

## 2016-10-03 DIAGNOSIS — D696 Thrombocytopenia, unspecified: Secondary | ICD-10-CM | POA: Diagnosis present

## 2016-10-03 DIAGNOSIS — D571 Sickle-cell disease without crisis: Secondary | ICD-10-CM | POA: Diagnosis not present

## 2016-10-03 DIAGNOSIS — Z86711 Personal history of pulmonary embolism: Secondary | ICD-10-CM | POA: Diagnosis not present

## 2016-10-03 DIAGNOSIS — D596 Hemoglobinuria due to hemolysis from other external causes: Secondary | ICD-10-CM | POA: Diagnosis not present

## 2016-10-03 DIAGNOSIS — E538 Deficiency of other specified B group vitamins: Secondary | ICD-10-CM | POA: Diagnosis present

## 2016-10-03 DIAGNOSIS — M7989 Other specified soft tissue disorders: Secondary | ICD-10-CM | POA: Diagnosis not present

## 2016-10-03 DIAGNOSIS — Z833 Family history of diabetes mellitus: Secondary | ICD-10-CM | POA: Diagnosis not present

## 2016-10-03 DIAGNOSIS — Z8249 Family history of ischemic heart disease and other diseases of the circulatory system: Secondary | ICD-10-CM | POA: Diagnosis not present

## 2016-10-03 DIAGNOSIS — R4189 Other symptoms and signs involving cognitive functions and awareness: Secondary | ICD-10-CM | POA: Diagnosis not present

## 2016-10-03 DIAGNOSIS — Z91040 Latex allergy status: Secondary | ICD-10-CM | POA: Diagnosis not present

## 2016-10-03 DIAGNOSIS — T82858A Stenosis of vascular prosthetic devices, implants and grafts, initial encounter: Secondary | ICD-10-CM | POA: Diagnosis not present

## 2016-10-03 DIAGNOSIS — Z832 Family history of diseases of the blood and blood-forming organs and certain disorders involving the immune mechanism: Secondary | ICD-10-CM | POA: Diagnosis not present

## 2016-10-03 DIAGNOSIS — Z886 Allergy status to analgesic agent status: Secondary | ICD-10-CM | POA: Diagnosis not present

## 2016-10-03 DIAGNOSIS — R0902 Hypoxemia: Secondary | ICD-10-CM | POA: Diagnosis present

## 2016-10-03 LAB — CBC WITH DIFFERENTIAL/PLATELET
BASOS ABS: 0 10*3/uL (ref 0.0–0.1)
Basophils Relative: 0 %
EOS ABS: 0.2 10*3/uL (ref 0.0–0.7)
Eosinophils Relative: 2 %
HCT: 19.3 % — ABNORMAL LOW (ref 36.0–46.0)
Hemoglobin: 7 g/dL — ABNORMAL LOW (ref 12.0–15.0)
LYMPHS ABS: 2.4 10*3/uL (ref 0.7–4.0)
Lymphocytes Relative: 26 %
MCH: 34.8 pg — ABNORMAL HIGH (ref 26.0–34.0)
MCHC: 36.3 g/dL — AB (ref 30.0–36.0)
MCV: 96 fL (ref 78.0–100.0)
MONO ABS: 1.7 10*3/uL — AB (ref 0.1–1.0)
Monocytes Relative: 18 %
NEUTROS ABS: 5 10*3/uL (ref 1.7–7.7)
Neutrophils Relative %: 54 %
Platelets: 131 10*3/uL — ABNORMAL LOW (ref 150–400)
RBC: 2.01 MIL/uL — ABNORMAL LOW (ref 3.87–5.11)
RDW: 21.2 % — AB (ref 11.5–15.5)
WBC: 9.3 10*3/uL (ref 4.0–10.5)

## 2016-10-03 LAB — COMPREHENSIVE METABOLIC PANEL
ALT: 25 U/L (ref 14–54)
AST: 83 U/L — AB (ref 15–41)
Albumin: 2.6 g/dL — ABNORMAL LOW (ref 3.5–5.0)
Alkaline Phosphatase: 107 U/L (ref 38–126)
Anion gap: 5 (ref 5–15)
BILIRUBIN TOTAL: 14.6 mg/dL — AB (ref 0.3–1.2)
BUN: 7 mg/dL (ref 6–20)
CO2: 25 mmol/L (ref 22–32)
CREATININE: 0.83 mg/dL (ref 0.44–1.00)
Calcium: 8.1 mg/dL — ABNORMAL LOW (ref 8.9–10.3)
Chloride: 106 mmol/L (ref 101–111)
GFR calc Af Amer: >60 mL/min (ref >60)
Glucose, Bld: 118 mg/dL — ABNORMAL HIGH (ref 65–99)
POTASSIUM: 3.1 mmol/L — AB (ref 3.5–5.1)
Sodium: 136 mmol/L (ref 135–145)
TOTAL PROTEIN: 7.3 g/dL (ref 6.5–8.1)

## 2016-10-03 LAB — TYPE AND SCREEN
ABO/RH(D): A POS
ANTIBODY SCREEN: POSITIVE
DAT, IgG: NEGATIVE

## 2016-10-03 LAB — LACTATE DEHYDROGENASE: LDH: 320 U/L — AB (ref 98–192)

## 2016-10-03 LAB — TROPONIN I: Troponin I: <0.03 ng/mL (ref <0.03)

## 2016-10-03 LAB — D-DIMER, QUANTITATIVE (NOT AT ARMC): D DIMER QUANT: 3.74 ug{FEU}/mL — AB (ref 0.00–0.50)

## 2016-10-03 LAB — HCG, QUANTITATIVE, PREGNANCY: hCG, Beta Chain, Quant, S: <1 m[IU]/mL (ref <5)

## 2016-10-03 MED ORDER — SENNOSIDES-DOCUSATE SODIUM 8.6-50 MG PO TABS
1.0000 | ORAL_TABLET | Freq: Two times a day (BID) | ORAL | Status: DC
  Administered 2016-10-04 – 2016-10-15 (×20): 1 via ORAL
  Filled 2016-10-03 (×27): qty 1

## 2016-10-03 MED ORDER — ENOXAPARIN SODIUM 40 MG/0.4ML ~~LOC~~ SOLN
40.0000 mg | SUBCUTANEOUS | Status: DC
  Administered 2016-10-03 – 2016-10-06 (×4): 40 mg via SUBCUTANEOUS
  Filled 2016-10-03 (×5): qty 0.4

## 2016-10-03 MED ORDER — HYDROMORPHONE 1 MG/ML IV SOLN
INTRAVENOUS | Status: DC
  Administered 2016-10-03: 1.2 mg via INTRAVENOUS
  Administered 2016-10-03 (×2): 2 mg via INTRAVENOUS
  Administered 2016-10-03: 25 mg via INTRAVENOUS
  Administered 2016-10-03: 6.6 mg via INTRAVENOUS
  Administered 2016-10-03: 2 mg via INTRAVENOUS
  Administered 2016-10-04: 5 mg via INTRAVENOUS
  Administered 2016-10-04: 2.59 mg via INTRAVENOUS
  Administered 2016-10-04: 6.2 mg via INTRAVENOUS
  Administered 2016-10-04: 7.6 mg via INTRAVENOUS
  Administered 2016-10-04: 3.2 mg via INTRAVENOUS
  Administered 2016-10-04: 25 mg via INTRAVENOUS
  Filled 2016-10-03 (×2): qty 25

## 2016-10-03 MED ORDER — HYDROXYUREA 500 MG PO CAPS
1000.0000 mg | ORAL_CAPSULE | Freq: Every day | ORAL | Status: DC
  Administered 2016-10-03 – 2016-10-04 (×2): 1000 mg via ORAL
  Filled 2016-10-03 (×3): qty 2

## 2016-10-03 MED ORDER — HYDROMORPHONE 1 MG/ML IV SOLN
INTRAVENOUS | Status: DC
  Administered 2016-10-03: 25 mg via INTRAVENOUS
  Filled 2016-10-03: qty 25

## 2016-10-03 MED ORDER — DEXTROSE-NACL 5-0.45 % IV SOLN
INTRAVENOUS | Status: DC
  Administered 2016-10-03 – 2016-10-16 (×13): via INTRAVENOUS

## 2016-10-03 MED ORDER — ONDANSETRON HCL 4 MG/2ML IJ SOLN
4.0000 mg | Freq: Four times a day (QID) | INTRAMUSCULAR | Status: DC | PRN
  Administered 2016-10-11 – 2016-10-15 (×6): 4 mg via INTRAVENOUS
  Filled 2016-10-03 (×6): qty 2

## 2016-10-03 MED ORDER — VITAMIN D (ERGOCALCIFEROL) 1.25 MG (50000 UNIT) PO CAPS
50000.0000 [IU] | ORAL_CAPSULE | ORAL | Status: DC
  Administered 2016-10-07 – 2016-10-14 (×2): 50000 [IU] via ORAL
  Filled 2016-10-03 (×2): qty 1

## 2016-10-03 MED ORDER — ADULT MULTIVITAMIN W/MINERALS CH
1.0000 | ORAL_TABLET | Freq: Every day | ORAL | Status: DC
  Administered 2016-10-03 – 2016-10-17 (×15): 1 via ORAL
  Filled 2016-10-03 (×15): qty 1

## 2016-10-03 MED ORDER — PROMETHAZINE HCL 25 MG PO TABS
12.5000 mg | ORAL_TABLET | Freq: Four times a day (QID) | ORAL | Status: DC | PRN
  Administered 2016-10-03 – 2016-10-16 (×13): 12.5 mg via ORAL
  Filled 2016-10-03 (×13): qty 1

## 2016-10-03 MED ORDER — INFLUENZA VAC SPLIT QUAD 0.5 ML IM SUSY
0.5000 mL | PREFILLED_SYRINGE | INTRAMUSCULAR | Status: AC
  Administered 2016-10-04: 10:00:00 0.5 mL via INTRAMUSCULAR
  Filled 2016-10-03: qty 0.5

## 2016-10-03 MED ORDER — FOLIC ACID 1 MG PO TABS
1.0000 mg | ORAL_TABLET | Freq: Every day | ORAL | Status: DC
  Administered 2016-10-03 – 2016-10-17 (×15): 1 mg via ORAL
  Filled 2016-10-03 (×15): qty 1

## 2016-10-03 MED ORDER — POLYETHYLENE GLYCOL 3350 17 G PO PACK: 17.0000 g | PACK | Freq: Every day | ORAL | Status: DC | PRN

## 2016-10-03 MED ORDER — NALOXONE HCL 0.4 MG/ML IJ SOLN: 0.4000 mg | INTRAMUSCULAR | Status: DC | PRN

## 2016-10-03 MED ORDER — IOPAMIDOL (ISOVUE-370) INJECTION 76%
100.0000 mL | Freq: Once | INTRAVENOUS | Status: AC | PRN
  Administered 2016-10-03: 100 mL via INTRAVENOUS

## 2016-10-03 MED ORDER — SODIUM CHLORIDE 0.9% FLUSH
9.0000 mL | INTRAVENOUS | Status: DC | PRN
Start: 2016-10-03 — End: 2016-10-15

## 2016-10-03 MED ORDER — MORPHINE SULFATE ER 15 MG PO TBCR
15.0000 mg | EXTENDED_RELEASE_TABLET | Freq: Two times a day (BID) | ORAL | Status: DC
  Administered 2016-10-03 – 2016-10-17 (×29): 15 mg via ORAL
  Filled 2016-10-03 (×29): qty 1

## 2016-10-03 MED ORDER — KETOROLAC TROMETHAMINE 30 MG/ML IJ SOLN
30.0000 mg | Freq: Four times a day (QID) | INTRAMUSCULAR | Status: AC
  Administered 2016-10-03 – 2016-10-08 (×20): 30 mg via INTRAVENOUS
  Filled 2016-10-03 (×20): qty 1

## 2016-10-03 MED ORDER — LORATADINE 10 MG PO TABS
10.0000 mg | ORAL_TABLET | Freq: Every day | ORAL | Status: DC
  Administered 2016-10-03 – 2016-10-17 (×15): 10 mg via ORAL
  Filled 2016-10-03 (×17): qty 1

## 2016-10-03 NOTE — ED Notes (Signed)
Patient returned from CT

## 2016-10-03 NOTE — H&P (Signed)
History and Physical    Dawn [name] [id number] DOB: [birth year] DOA: 10/02/2016  PCP: Angelica Chessman, MD  Patient coming from: Home.  Chief Complaint: Lower extremity pain. Chest pain.  HPI: Dawn [name] is a 36 y.o. female with sickle cell anemia presents to the ER because of worsening lower extremity pain and chest pain. Patient was recently admitted for similar complaints and improved and was discharged home. Patient states last 2-3 days pain has been worsening. Denies any productive cough fever chills headaches or any visual symptoms of focal deficits. Chest pain is mostly in the lower sternum. No associated shortness of breath. In the ER patient is found to be hypoxic. Chest x-ray was unremarkable. D-dimer is elevated. Patient is being admitted for sickle cell pain crisis and possible acute chest syndrome.   ED Course: Patient was started on fluids and pain relief medications.  Review of Systems: As per HPI, rest all negative.   Past Medical History:  Diagnosis Date  . Blood transfusion   . Demand ischemia ([place]) 02/06/2016  . HCAP (healthcare-associated pneumonia) 05/19/2013  . 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 Pulmonary hypertension 02/06/2016   49 mmHg per echo on 02/06/16   . 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 Sickle cell anemia (HCC)   . Sickle cell disease ([place])   . 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 Vitamin B12 deficiency 07/07/2015    Past Surgical History:  Procedure Laterality Date  .  left knee ACL reconstruction    . CESAREAN SECTION     x 2  . CHOLECYSTECTOMY    . IR GENERIC HISTORICAL  07/17/2016   IR FLUORO GUIDE PORT INSERTION RIGHT 07/17/2016 Aletta Edouard, MD [facility]  . IR GENERIC HISTORICAL  07/17/2016   IR US GUIDE VASC ACCESS RIGHT 07/17/2016 Aletta Edouard, MD [facility]  . PORT-A-CATH REMOVAL Left 07/29/2013   Procedure: REMOVAL PORT-A-CATH;  Surgeon: Scherry Ran, MD;  Location: [facility];  Service: General;   Laterality: Left;  . PORTACATH PLACEMENT    . PORTACATH PLACEMENT Left 02/23/2013   Procedure: INSERTION PORT-A-CATH;  Surgeon: Donato Heinz, MD;  Location: [facility] OR[facility];  Service: General;  Laterality: Left;  . TEE WITHOUT CARDIOVERSION N/A 08/07/2013   Procedure: TRANSESOPHAGEAL ECHOCARDIOGRAM (TEE);  Surgeon: Arnoldo Lenis, MD;  Location: [facility];  Service: Cardiology;  Laterality: N/A;     reports that she has never smoked. She has never used smokeless tobacco. She reports that she does not drink alcohol or use drugs.  Allergies  Allergen Reactions  . Desferal [Deferoxamine] Hives    Local reaction on arm only during infusion. Can take with benadryl   . Latex Other (See Comments)    REACTION: Pt experiences a burning sensation on contacted skin areas  . Lisinopril Other (See Comments) and Cough    REACTION: Sore/scratchy throat  . Tape Other (See Comments)    REACTION: Pt. Experiences a burning sensation on contacted skin areas  . Tylenol [Acetaminophen] Other (See Comments)    Pt. Does not take the following due to protection of kidney health. Past occurrence of Protein in urine.    Family History  Problem Relation Age of Onset  . Sickle cell trait Mother   . Sickle cell trait Father   . Hypertension Father   . Diabetes Father   . Sickle cell trait Sister   . Cancer Paternal Aunt     Breast    Prior to Admission medications  Medication Sig Start Date End Date Taking? Authorizing Provider  folic acid (FOLVITE) 1 MG tablet Take 1 tablet (1 mg total) by mouth daily. 03/14/16  Yes [name], FNP  hydroxyurea (HYDREA) 500 MG capsule Take 2 capsules (1,000 mg total) by mouth daily. May take with food to minimize GI side effects. 03/14/16  Yes [name], FNP  loratadine (CLARITIN) 10 MG tablet Take 10 mg by mouth daily.   Yes Historical Provider, MD  morphine (MS CONTIN) 15 MG 12 hr tablet Take 15 mg by mouth every 12 (twelve) hours.   Yes Historical Provider,  MD  Multiple Vitamin (MULTIVITAMIN WITH MINERALS) TABS tablet Take 1 tablet by mouth daily.   Yes Historical Provider, MD  Oxycodone HCl 10 MG TABS Take 1 tablet (10 mg total) by mouth every 6 (six) hours as needed. 09/25/16  Yes Kinnie Feil, PA-C  promethazine (PHENERGAN) 12.5 MG tablet Take 1 tablet (12.5 mg total) by mouth every 6 (six) hours as needed for nausea or vomiting. 09/25/16  Yes Kinnie Feil, PA-C  Vitamin D, Ergocalciferol, (DRISDOL) 50000 units CAPS capsule Take 1 capsule (50,000 Units total) by mouth every 7 (seven) days. Takes on Sundays. 06/01/16  Yes Micheline Chapman, NP  levonorgestrel (MIRENA) 20 MCG/24HR IUD 1 each by Intrauterine route once.    Historical Provider, MD    Physical Exam: Vitals:   10/03/16 0407 10/03/16 0416 10/03/16 0500 10/03/16 0515  BP:   124/78   Pulse: 83 87  83  Resp: 15 15  14   Temp:      TempSrc:      SpO2: (!) 88% (!) 87%  92%  Weight:          Constitutional: Moderately built and nourished. Vitals:   10/03/16 0407 10/03/16 0416 10/03/16 0500 10/03/16 0515  BP:   124/78   Pulse: 83 87  83  Resp: 15 15  14   Temp:      TempSrc:      SpO2: (!) 88% (!) 87%  92%  Weight:       Eyes: Anicteric no pallor. ENMT: No discharge from the ears eyes nose or mouth. Neck: No mass felt. No neck rigidity. No JVD appreciated. Respiratory: No rhonchi or crepitations. Cardiovascular: S1-S2 heard. Abdomen: Soft nontender bowel sounds present. No guarding or rigidity. Musculoskeletal: No edema. No joint effusion. Skin: No rash. Skin appears warm. Neurologic: Alert awake oriented to time place and person. Moves all extremities. Psychiatric: Appears normal. Normal affect.   Labs on Admission: I have personally reviewed following labs and imaging studies  CBC:  Recent Labs Lab 09/28/16 1025 09/30/16 1805 10/02/16 2018  WBC 8.2 9.0 10.2  NEUTROABS 4.7 5.2 5.5  HGB 7.4* 7.8* 7.4*  HCT 19.8* 21.0* 20.0*  MCV 94.7 95.5 96.2  PLT 177  159 [id number]*   Basic Metabolic Panel:  Recent Labs Lab 09/28/16 1025 09/30/16 1805 10/02/16 2018  NA 136 138 136  K 3.4* 3.5 3.1*  CL 104 106 105  CO2 23 24 25   GLUCOSE 89 93 109*  BUN 5* 6 5*  CREATININE 0.40* 0.43* 0.52  CALCIUM 8.5* 8.6* 8.4*   GFR: Estimated Creatinine Clearance: 93.9 mL/min (by C-G formula based on SCr of 0.52 mg/dL). Liver Function Tests:  Recent Labs Lab 09/28/16 1025 09/30/16 1805 10/02/16 2018  AST 90* 86* 86*  ALT 29 28 27   ALKPHOS 129* 125 113  BILITOT 15.4* 15.1* 15.4*  PROT 7.6 7.9 7.6  ALBUMIN  2.8* 2.8* 2.7*   No results for input(s): LIPASE, AMYLASE in the last 168 hours. No results for input(s): AMMONIA in the last 168 hours. Coagulation Profile: No results for input(s): INR, PROTIME in the last 168 hours. Cardiac Enzymes:  Recent Labs Lab 09/30/16 1805 10/03/16 0420  TROPONINI <0.03 <0.03   BNP (last 3 results) No results for input(s): PROBNP in the last 8760 hours. HbA1C: No results for input(s): HGBA1C in the last 72 hours. CBG: No results for input(s): GLUCAP in the last 168 hours. Lipid Profile: No results for input(s): CHOL, HDL, LDLCALC, TRIG, CHOLHDL, LDLDIRECT in the last 72 hours. Thyroid Function Tests: No results for input(s): TSH, T4TOTAL, FREET4, T3FREE, THYROIDAB in the last 72 hours. Anemia Panel:  Recent Labs  09/30/16 1805 10/02/16 2018  RETICCTPCT >23.0* >23.0*   Urine analysis:    Component Value Date/Time   COLORURINE AMBER (A) 09/28/2016 1305   APPEARANCEUR CLEAR 09/28/2016 1305   LABSPEC 1.009 09/28/2016 1305   PHURINE 7.0 09/28/2016 1305   GLUCOSEU NEGATIVE 09/28/2016 1305   HGBUR MODERATE (A) 09/28/2016 1305   BILIRUBINUR MODERATE (A) 09/28/2016 1305   KETONESUR NEGATIVE 09/28/2016 1305   PROTEINUR 100 (A) 09/28/2016 1305   UROBILINOGEN >=8.0 09/07/2016 1004   NITRITE NEGATIVE 09/28/2016 1305   LEUKOCYTESUR NEGATIVE 09/28/2016 1305   Sepsis  Labs: @LABRCNTIP (procalcitonin:4,lacticidven:4) )No results found for this or any previous visit (from the past 240 hour(s)).   Radiological Exams on Admission: Dg Chest 2 View  Result Date: 10/02/2016 CLINICAL DATA:  Sickle-cell crisis.  Short of breath. EXAM: CHEST  2 VIEW COMPARISON:  09/30/2016 FINDINGS: Mild cardiac enlargement. Lungs are clear without infiltrate effusion or edema. Port-A-Cath tip in the lower SVC unchanged. No change from the prior study. IMPRESSION: No active cardiopulmonary disease. Electronically Signed   By: Franchot Gallo M.D.   On: 10/02/2016 19:09    EKG: Independently reviewed. Normal sinus rhythm.  Assessment/Plan Principal Problem:   Sickle cell pain crisis [facility]) Active Problems:   Chest pain   Sickle cell crisis ([place])    1. Sickle cell pain crisis - patient has been placed on weight-based Dilaudid PCA. Continue hydration. Continue patient's long-acting pain relief medications and hydroxyurea. 2. Chest pain with hypoxia - concerning for acute chest syndrome. Since d-dimer is elevated I have ordered CT angiogram of the chest. Patient will be empirically placed on antibiotics for now. Type and screen.   DVT prophylaxis: Lovenox. Code Status: Full code.  Family Communication: Discussed with patient.  Disposition Plan: Home.  Consults called: None.  Admission status: Observation.    Rise Patience MD [facility] Pager [phone].  If 7PM-7AM, please contact night-coverage [email or web address] Password [facility]  10/03/2016, 5:33 AM

## 2016-10-03 NOTE — ED Notes (Signed)
Patient transported to CT 

## 2016-10-03 NOTE — ED Notes (Signed)
Pt c/o worsening chest pain; Hospitalist [place] notified; EKG obtained

## 2016-10-03 NOTE — Progress Notes (Signed)
SICKLE CELL SERVICE PROGRESS NOTE  Dawn [name] [id number] DOB: 1981-06-20 DOA: 10/02/2016 PCP: Angelica Chessman, MD  Assessment/Plan: Principal Problem:   Sickle cell pain crisis ([place]) Active Problems:   Chest pain   Sickle cell crisis ([place])  1. Hb SS with crisis: Continue PCA at current dose, resume Toradol and continue IVF. Re-assess pain control tomorrow. 2. Anemia of Chronic Disease: Hb stable currently. No indication for transfusion. Will continue to monitor. Continue Hydrea.  3. Chronic pain: Continue MS Contin 4. Medication non-compliance  Code Status: Full Code Family Communication: N/A Disposition Plan: Not yet ready for discharge  [place].  Pager [phone]. If 7PM-7AM, please contact night-coverage.  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 Her pain is localized to her low back, legs and sternum. She rates pain as 8/10.   Consultants:  None  Procedures:  None  Antibiotics:  None   Objective: Vitals:   10/03/16 1031 10/03/16 1200 10/03/16 1400 10/03/16 1447  BP:   124/66   Pulse:   88   Resp:  20 16 17   Temp:   98.3 F (36.8 C)   TempSrc:   Oral   SpO2:  93% 95% 98%  Weight:      Height: 5\' 3"  (1.6 m)      Weight change:   Intake/Output Summary (Last 24 hours) at 10/03/16 1515 Last data filed at 10/03/16 1300  Gross per 24 hour  Intake              480 ml  Output              400 ml  Net               80 ml    General: Alert, awake, oriented x3, in mild distress.  HEENT: [place]/AT PEERL, EOMI, moderate icterus which is his baseline. Neck: Trachea midline,  no masses, no thyromegal,y no JVD, no carotid bruit OROPHARYNX:  Moist, No exudate/ erythema/lesions.  Heart: Regular rate and rhythm, without murmurs, rubs, gallops, PMI non-displaced, no heaves or thrills on palpation.  Lungs: Clear to auscultation, no wheezing or  rhonchi noted. No increased vocal fremitus resonant to percussion  Abdomen: Soft, nontender, nondistended, positive bowel sounds, no masses no hepatosplenomegaly noted.  Neuro: No focal neurological deficits noted cranial nerves II through XII grossly intact.  Strength at functional baseline in bilateral upper and lower extremities. Musculoskeletal: No warmth swelling or erythema around joints, no spinal tenderness noted. Psychiatric: Patient alert and oriented x3, good insight and cognition, good recent to remote recall.    Data Reviewed: Basic Metabolic Panel:  Recent Labs Lab 09/28/16 1025 09/30/16 1805 10/02/16 2018 10/03/16 0623  NA 136 138 136 136  K 3.4* 3.5 3.1* 3.1*  CL 104 106 105 106  CO2 23 24 25 25   GLUCOSE 89 93 109* 118*  BUN 5* 6 5* 7  CREATININE 0.40* 0.43* 0.52 0.83  CALCIUM 8.5* 8.6* 8.4* 8.1*   Liver Function Tests:  Recent Labs Lab 09/28/16 1025 09/30/16 1805 10/02/16 2018 10/03/16 0623  AST 90* 86* 86* 83*  ALT 29 28 27 25   ALKPHOS 129* 125 113 107  BILITOT 15.4* 15.1* 15.4* 14.6*  PROT 7.6 7.9 7.6 7.3  ALBUMIN 2.8* 2.8* 2.7* 2.6*   No results for input(s): LIPASE, AMYLASE in the last 168 hours. No results for input(s): AMMONIA  in the last 168 hours. CBC:  Recent Labs Lab 09/28/16 1025 09/30/16 1805 10/02/16 2018 10/03/16 0623  WBC 8.2 9.0 10.2 9.3  NEUTROABS 4.7 5.2 5.5 5.0  HGB 7.4* 7.8* 7.4* 7.0*  HCT 19.8* 21.0* 20.0* 19.3*  MCV 94.7 95.5 96.2 96.0  PLT 177 159 148* 131*   Cardiac Enzymes:  Recent Labs Lab 09/30/16 1805 10/03/16 0420 10/03/16 0724  TROPONINI <0.03 <0.03 <0.03   BNP (last 3 results) No results for input(s): BNP in the last 8760 hours.  ProBNP (last 3 results) No results for input(s): PROBNP in the last 8760 hours.  CBG: No results for input(s): GLUCAP in the last 168 hours.  No results found for this or any previous visit (from the past 240 hour(s)).   Studies: Dg Chest 2 View  Result Date:  10/02/2016 CLINICAL DATA:  Sickle-cell crisis.  Short of breath. EXAM: CHEST  2 VIEW COMPARISON:  09/30/2016 FINDINGS: Mild cardiac enlargement. Lungs are clear without infiltrate effusion or edema. Port-A-Cath tip in the lower SVC unchanged. No change from the prior study. IMPRESSION: No active cardiopulmonary disease. Electronically Signed   By: Franchot Gallo M.D.   On: 10/02/2016 19:09   Dg Chest 2 View  Result Date: 09/30/2016 CLINICAL DATA:  36 year old with current history of sickle cell disease, presenting with 2 week history of intermittent chest pain which acutely worsened today. EXAM: CHEST  2 VIEW COMPARISON:  08/03/2016, 04/28/2016 and earlier, including CTA chest 08/25/2015. FINDINGS: Cardiac silhouette upper normal in size to slightly enlarged, unchanged. Hilar and mediastinal contours otherwise unremarkable. Right jugular Port-A-Cath tip remains in the lower SVC, unchanged. Lungs clear. Bronchovascular markings normal. Pulmonary vascularity normal. No visible pleural effusions. No pneumothorax. Visualized bony thorax intact. IMPRESSION: Stable borderline heart size.  No acute cardiopulmonary disease. Electronically Signed   By: Evangeline Dakin M.D.   On: 09/30/2016 18:01   Ct Angio Chest Pe W Or Wo Contrast  Result Date: 10/03/2016 CLINICAL DATA:  Sickle cell anemia, worsening lower extremity and chest pain EXAM: CT ANGIOGRAPHY CHEST WITH CONTRAST TECHNIQUE: Multidetector CT imaging of the chest was performed using the standard protocol during bolus administration of intravenous contrast. Multiplanar CT image reconstructions and MIPs were obtained to evaluate the vascular anatomy. CONTRAST:  100 cc Isovue COMPARISON:  08/25/2015 FINDINGS: Cardiovascular: Borderline cardiomegaly. No pericardial effusion. There is right IJ Port-A-Cath with tip in SVC right atrium junction. No pulmonary embolus No aortic aneurysm of aortic dissection. Mediastinum/Nodes: No mediastinal hematoma or adenopathy.  Central airway is patent. Lungs/Pleura: Mild central venous congestion. Images of the lung parenchyma shows no infiltrate or pulmonary edema. No focal consolidation. Mild dependent atelectasis noted bilateral lower lobe posteriorly. There is no pneumothorax. No bronchiectasis. No emphysema. Upper Abdomen: The visualized upper abdomen shows status post cholecystectomy. No adrenal gland mass. There is nodular contour of the liver consistent with cirrhosis. Lobulated contour bilateral kidney. Musculoskeletal: Stable sclerotic bony changes within thoracic spine and sternum consistent with history of sickle cell anemia. No bone destruction or acute fracture. Stable sclerotic changes bilateral proximal humerus. Review of the MIP images confirms the above findings. IMPRESSION: 1. No pulmonary embolus is noted. 2. No mediastinal hematoma or adenopathy. 3. Borderline cardiomegaly.  No pericardial effusion. 4. Central mild venous congestion without pulmonary edema. No segmental consolidation. Mild dependent atelectasis bilateral lower lobe posteriorly. No pneumothorax. 5. Stable chronic sclerotic bony changes. 6. Again noted nodular contour of the liver consistent with cirrhosis. Status post cholecystectomy. Electronically Signed   By: Julien Girt  Pop M.D.   On: 10/03/2016 08:40    Scheduled Meds: . enoxaparin (LOVENOX) injection  40 mg Subcutaneous Q24H  . folic acid  1 mg Oral Daily  . HYDROmorphone   Intravenous Q4H  . hydroxyurea  1,000 mg Oral Daily  . [START ON 10/04/2016] Influenza vac split quadrivalent PF  0.5 mL Intramuscular Tomorrow-1000  . ketorolac  30 mg Intravenous Q6H  . loratadine  10 mg Oral Daily  . morphine  15 mg Oral Q12H  . multivitamin with minerals  1 tablet Oral Daily  . senna-docusate  1 tablet Oral BID  . [START ON 10/07/2016] Vitamin D (Ergocalciferol)  50,000 Units Oral Q7 days   Continuous Infusions: . dextrose 5 % and 0.45% NaCl      Principal Problem:   Sickle cell pain crisis  ([facility]) Active Problems:   Chest pain   Sickle cell crisis ([facility])     In excess of 25 minutes spent during this consult. Greater than 50% involved face to face contact with the patient for assessment, counseling and coordination of care.

## 2016-10-03 NOTE — ED Notes (Signed)
Pt rates chest pain 7/10

## 2016-10-03 NOTE — ED Notes (Signed)
Pt's SpO2 dropping into 80's on RA; [place] reapplied at 2L

## 2016-10-03 NOTE — ED Notes (Signed)
RN at bedside, port has been accessed. Will collect labs.

## 2016-10-03 NOTE — ED Notes (Signed)
Per Dr. Hal Hope, remove O2 by [place] to observe SpO2 on RA

## 2016-10-04 DIAGNOSIS — D638 Anemia in other chronic diseases classified elsewhere: Secondary | ICD-10-CM

## 2016-10-04 DIAGNOSIS — D5700 Hb-SS disease with crisis, unspecified: Principal | ICD-10-CM

## 2016-10-04 MED ORDER — LIP MEDEX EX OINT
TOPICAL_OINTMENT | CUTANEOUS | Status: AC
  Administered 2016-10-04: 12:00:00
  Filled 2016-10-04: qty 7

## 2016-10-04 MED ORDER — SODIUM CHLORIDE 0.9% FLUSH
10.0000 mL | INTRAVENOUS | Status: DC | PRN
  Administered 2016-10-08 – 2016-10-10 (×2): 10 mL
  Administered 2016-10-11: 20 mL
  Filled 2016-10-04 (×3): qty 40

## 2016-10-04 MED ORDER — METHOCARBAMOL 500 MG PO TABS
500.0000 mg | ORAL_TABLET | Freq: Three times a day (TID) | ORAL | Status: DC | PRN
  Administered 2016-10-08 – 2016-10-15 (×11): 500 mg via ORAL
  Filled 2016-10-04 (×12): qty 1

## 2016-10-04 MED ORDER — HYDROMORPHONE 1 MG/ML IV SOLN
INTRAVENOUS | Status: DC
  Administered 2016-10-04: 0.6 mg via INTRAVENOUS
  Administered 2016-10-05: 25 mg via INTRAVENOUS
  Administered 2016-10-05: 3.6 mg via INTRAVENOUS
  Administered 2016-10-05: 5.6 mg via INTRAVENOUS
  Administered 2016-10-05: 3.2 mg via INTRAVENOUS
  Administered 2016-10-05: 6.8 mg via INTRAVENOUS
  Administered 2016-10-05: 2 mg via INTRAVENOUS
  Administered 2016-10-05: 5 mg via INTRAVENOUS
  Administered 2016-10-06: 08:00:00 via INTRAVENOUS
  Administered 2016-10-06: 7.6 mg via INTRAVENOUS
  Administered 2016-10-06: 4 mg via INTRAVENOUS
  Administered 2016-10-06: 4.9 mg via INTRAVENOUS
  Administered 2016-10-06: 11.7 mg via INTRAVENOUS
  Administered 2016-10-06: 0.7 mg via INTRAVENOUS
  Administered 2016-10-06: 21:00:00 via INTRAVENOUS
  Administered 2016-10-06: 4.1 mg via INTRAVENOUS
  Administered 2016-10-06 (×3): 2 mg via INTRAVENOUS
  Administered 2016-10-06: 4.9 mg via INTRAVENOUS
  Administered 2016-10-07 (×2): 2 mg via INTRAVENOUS
  Administered 2016-10-07: 5.5 mg via INTRAVENOUS
  Administered 2016-10-07 (×3): 2 mg via INTRAVENOUS
  Administered 2016-10-07: 6.8 mg via INTRAVENOUS
  Administered 2016-10-07: 3.4 mg via INTRAVENOUS
  Administered 2016-10-07: 2 mg via INTRAVENOUS
  Administered 2016-10-07: 11:00:00 via INTRAVENOUS
  Administered 2016-10-07 (×3): 2 mg via INTRAVENOUS
  Administered 2016-10-07: 3.4 mg via INTRAVENOUS
  Administered 2016-10-07: 7.5 mg via INTRAVENOUS
  Administered 2016-10-07: 8.2 mg via INTRAVENOUS
  Administered 2016-10-07: 8.9 mg via INTRAVENOUS
  Administered 2016-10-08: 1 mg via INTRAVENOUS
  Administered 2016-10-08: 7.7 mg via INTRAVENOUS
  Administered 2016-10-08: 7.6 mg via INTRAVENOUS
  Administered 2016-10-08: 8.3 mL via INTRAVENOUS
  Administered 2016-10-08: 2 mg via INTRAVENOUS
  Administered 2016-10-08 (×2): 1 mg via INTRAVENOUS
  Administered 2016-10-08: 6.2 mg via INTRAVENOUS
  Administered 2016-10-08: 2 mg via INTRAVENOUS
  Administered 2016-10-09: 9.6 mg via INTRAVENOUS
  Administered 2016-10-09: 2.1 mg via INTRAVENOUS
  Administered 2016-10-09: 4.6 mg via INTRAVENOUS
  Administered 2016-10-09: 2 mg via INTRAVENOUS
  Filled 2016-10-04 (×6): qty 25

## 2016-10-04 MED ORDER — METHOCARBAMOL 500 MG PO TABS
500.0000 mg | ORAL_TABLET | Freq: Three times a day (TID) | ORAL | Status: AC
  Administered 2016-10-04 – 2016-10-06 (×6): 500 mg via ORAL
  Filled 2016-10-04 (×5): qty 1

## 2016-10-04 MED ORDER — POTASSIUM CHLORIDE CRYS ER 20 MEQ PO TBCR
40.0000 meq | EXTENDED_RELEASE_TABLET | Freq: Two times a day (BID) | ORAL | Status: DC
  Administered 2016-10-04 – 2016-10-17 (×26): 40 meq via ORAL
  Filled 2016-10-04 (×27): qty 2

## 2016-10-04 NOTE — Progress Notes (Signed)
SICKLE CELL SERVICE PROGRESS NOTE  Dawn [name] [id number] DOB: [birth year] DOA: 10/02/2016 PCP: Angelica Chessman, MD  Assessment/Plan: Principal Problem:   Sickle cell pain crisis ([place]) Active Problems:   Chest pain   Sickle cell crisis ([place])  1. Hb SS with crisis: Increase PCA bolus to 0.7 mg  continue Toradol and continue IVF. Re-assess pain control tomorrow. 2. Anemia of Chronic Disease: Hb stable currently. No indication for transfusion. Will continue to monitor. Continue Hydrea.  3. Chronic pain: Continue MS Contin   Code Status: Full Code Family Communication: N/A Disposition Plan: Not yet ready for discharge  [place].  Pager [phone]. If 7PM-7AM, please contact night-coverage.  10/04/2016, 4:46 PM  LOS: 1 day   Interim History: Pt rates her pain as 8/10 and localized to legs. She has not been using the PCA maximally.  Consultants:  None  Procedures:  None  Antibiotics:  None   Objective: Vitals:   10/04/16 0648 10/04/16 0800 10/04/16 1220 10/04/16 1430  BP: (!) 144/92   137/77  Pulse: 84   95  Resp: 18 16 15 16   Temp: 98.1 F (36.7 C)   98.2 F (36.8 C)  TempSrc: Oral   Oral  SpO2: 97% 100% 97% 96%  Weight:      Height:       Weight change:   Intake/Output Summary (Last 24 hours) at 10/04/16 1646 Last data filed at 10/04/16 1431  Gross per 24 hour  Intake          2173.33 ml  Output             1600 ml  Net           573.33 ml    General: Alert, awake, oriented x3, in mild to moderate distress.  HEENT: /AT PEERL, EOMI, moderate icterus which is his baseline. Neck: Trachea midline,  no masses, no thyromegal,y no JVD, no carotid bruit OROPHARYNX:  Moist, No exudate/ erythema/lesions.  Heart: Regular rate and rhythm, without murmurs, rubs, gallops, PMI non-displaced, no heaves or thrills on palpation.  Lungs: Clear to auscultation, no wheezing or rhonchi noted. No increased vocal fremitus resonant to percussion  Abdomen: Soft,  nontender, nondistended, positive bowel sounds, no masses no hepatosplenomegaly noted.  Neuro: No focal neurological deficits noted cranial nerves II through XII grossly intact.  Strength at functional baseline in bilateral upper and lower extremities. Musculoskeletal: No warmth swelling or erythema around joints, no spinal tenderness noted. Psychiatric: Patient alert and oriented x3, good insight and cognition, good recent to remote recall.    Data Reviewed: Basic Metabolic Panel:  Recent Labs Lab 09/28/16 1025 09/30/16 1805 10/02/16 2018 10/03/16 0623  NA 136 138 136 136  K 3.4* 3.5 3.1* 3.1*  CL 104 106 105 106  CO2 23 24 25 25   GLUCOSE 89 93 109* 118*  BUN 5* 6 5* 7  CREATININE 0.40* 0.43* 0.52 0.83  CALCIUM 8.5* 8.6* 8.4* 8.1*   Liver Function Tests:  Recent Labs Lab 09/28/16 1025 09/30/16 1805 10/02/16 2018 10/03/16 0623  AST 90* 86* 86* 83*  ALT 29 28 27 25   ALKPHOS 129* 125 113 107  BILITOT 15.4* 15.1* 15.4* 14.6*  PROT 7.6 7.9 7.6 7.3  ALBUMIN 2.8* 2.8* 2.7* 2.6*   No results for input(s): LIPASE, AMYLASE in the last 168 hours. No results for input(s): AMMONIA in the last 168 hours. CBC:  Recent Labs Lab 09/28/16 1025 09/30/16 1805 10/02/16 2018 10/03/16 0623  WBC 8.2 9.0 10.2  9.3  NEUTROABS 4.7 5.2 5.5 5.0  HGB 7.4* 7.8* 7.4* 7.0*  HCT 19.8* 21.0* 20.0* 19.3*  MCV 94.7 95.5 96.2 96.0  PLT 177 159 148* 131*   Cardiac Enzymes:  Recent Labs Lab 09/30/16 1805 10/03/16 0420 10/03/16 0724  TROPONINI <0.03 <0.03 <0.03   BNP (last 3 results) No results for input(s): BNP in the last 8760 hours.  ProBNP (last 3 results) No results for input(s): PROBNP in the last 8760 hours.  CBG: No results for input(s): GLUCAP in the last 168 hours.  No results found for this or any previous visit (from the past 240 hour(s)).   Studies: Dg Chest 2 View  Result Date: 10/02/2016 CLINICAL DATA:  Sickle-cell crisis.  Short of breath. EXAM: CHEST  2 VIEW  COMPARISON:  09/30/2016 FINDINGS: Mild cardiac enlargement. Lungs are clear without infiltrate effusion or edema. Port-A-Cath tip in the lower SVC unchanged. No change from the prior study. IMPRESSION: No active cardiopulmonary disease. Electronically Signed   By: Franchot Gallo M.D.   On: 10/02/2016 19:09   Dg Chest 2 View  Result Date: 09/30/2016 CLINICAL DATA:  36 year old with current history of sickle cell disease, presenting with 2 week history of intermittent chest pain which acutely worsened today. EXAM: CHEST  2 VIEW COMPARISON:  08/03/2016, 04/28/2016 and earlier, including CTA chest 08/25/2015. FINDINGS: Cardiac silhouette upper normal in size to slightly enlarged, unchanged. Hilar and mediastinal contours otherwise unremarkable. Right jugular Port-A-Cath tip remains in the lower SVC, unchanged. Lungs clear. Bronchovascular markings normal. Pulmonary vascularity normal. No visible pleural effusions. No pneumothorax. Visualized bony thorax intact. IMPRESSION: Stable borderline heart size.  No acute cardiopulmonary disease. Electronically Signed   By: Evangeline Dakin M.D.   On: 09/30/2016 18:01   Ct Angio Chest Pe W Or Wo Contrast  Result Date: 10/03/2016 CLINICAL DATA:  Sickle cell anemia, worsening lower extremity and chest pain EXAM: CT ANGIOGRAPHY CHEST WITH CONTRAST TECHNIQUE: Multidetector CT imaging of the chest was performed using the standard protocol during bolus administration of intravenous contrast. Multiplanar CT image reconstructions and MIPs were obtained to evaluate the vascular anatomy. CONTRAST:  100 cc Isovue COMPARISON:  08/25/2015 FINDINGS: Cardiovascular: Borderline cardiomegaly. No pericardial effusion. There is right IJ Port-A-Cath with tip in SVC right atrium junction. No pulmonary embolus No aortic aneurysm of aortic dissection. Mediastinum/Nodes: No mediastinal hematoma or adenopathy. Central airway is patent. Lungs/Pleura: Mild central venous congestion. Images of the  lung parenchyma shows no infiltrate or pulmonary edema. No focal consolidation. Mild dependent atelectasis noted bilateral lower lobe posteriorly. There is no pneumothorax. No bronchiectasis. No emphysema. Upper Abdomen: The visualized upper abdomen shows status post cholecystectomy. No adrenal gland mass. There is nodular contour of the liver consistent with cirrhosis. Lobulated contour bilateral kidney. Musculoskeletal: Stable sclerotic bony changes within thoracic spine and sternum consistent with history of sickle cell anemia. No bone destruction or acute fracture. Stable sclerotic changes bilateral proximal humerus. Review of the MIP images confirms the above findings. IMPRESSION: 1. No pulmonary embolus is noted. 2. No mediastinal hematoma or adenopathy. 3. Borderline cardiomegaly.  No pericardial effusion. 4. Central mild venous congestion without pulmonary edema. No segmental consolidation. Mild dependent atelectasis bilateral lower lobe posteriorly. No pneumothorax. 5. Stable chronic sclerotic bony changes. 6. Again noted nodular contour of the liver consistent with cirrhosis. Status post cholecystectomy. Electronically Signed   By: Lahoma Crocker M.D.   On: 10/03/2016 08:40    Scheduled Meds: . enoxaparin (LOVENOX) injection  40 mg Subcutaneous Q24H  .  folic acid  1 mg Oral Daily  . HYDROmorphone   Intravenous Q4H  . hydroxyurea  1,000 mg Oral Daily  . ketorolac  30 mg Intravenous Q6H  . loratadine  10 mg Oral Daily  . methocarbamol  500 mg Oral TID  . morphine  15 mg Oral Q12H  . multivitamin with minerals  1 tablet Oral Daily  . potassium chloride  40 mEq Oral BID  . senna-docusate  1 tablet Oral BID  . [START ON 10/07/2016] Vitamin D (Ergocalciferol)  50,000 Units Oral Q7 days   Continuous Infusions: . dextrose 5 % and 0.45% NaCl 100 mL/hr at 10/04/16 1011    Principal Problem:   Sickle cell pain crisis ([facility]) Active Problems:   Chest pain   Sickle cell crisis ([facility])     In excess  of 25 minutes spent during this consult. Greater than 50% involved face to face contact with the patient for assessment, counseling and coordination of care.

## 2016-10-05 LAB — CBC WITH DIFFERENTIAL/PLATELET
BASOS PCT: 0 %
Basophils Absolute: 0 10*3/uL (ref 0.0–0.1)
EOS PCT: 3 %
Eosinophils Absolute: 0.3 10*3/uL (ref 0.0–0.7)
HEMATOCRIT: 15.9 % — AB (ref 36.0–46.0)
HEMOGLOBIN: 6 g/dL — AB (ref 12.0–15.0)
LYMPHS ABS: 2.6 10*3/uL (ref 0.7–4.0)
Lymphocytes Relative: 23 %
MCH: 35.9 pg — ABNORMAL HIGH (ref 26.0–34.0)
MCHC: 37.7 g/dL — AB (ref 30.0–36.0)
MCV: 95.2 fL (ref 78.0–100.0)
MONOS PCT: 15 %
Monocytes Absolute: 1.7 10*3/uL — ABNORMAL HIGH (ref 0.1–1.0)
NEUTROS ABS: 6.5 10*3/uL (ref 1.7–7.7)
Neutrophils Relative %: 59 %
Platelets: 120 10*3/uL — ABNORMAL LOW (ref 150–400)
RBC: 1.67 MIL/uL — ABNORMAL LOW (ref 3.87–5.11)
RDW: 21 % — ABNORMAL HIGH (ref 11.5–15.5)
WBC: 11.1 10*3/uL — ABNORMAL HIGH (ref 4.0–10.5)

## 2016-10-05 LAB — BASIC METABOLIC PANEL
Anion gap: 5 (ref 5–15)
BUN: 6 mg/dL (ref 6–20)
CHLORIDE: 108 mmol/L (ref 101–111)
CO2: 24 mmol/L (ref 22–32)
Calcium: 7.9 mg/dL — ABNORMAL LOW (ref 8.9–10.3)
Creatinine, Ser: 0.35 mg/dL — ABNORMAL LOW (ref 0.44–1.00)
GFR calc Af Amer: >60 mL/min (ref >60)
GFR calc non Af Amer: >60 mL/min (ref >60)
GLUCOSE: 114 mg/dL — AB (ref 65–99)
POTASSIUM: 3.5 mmol/L (ref 3.5–5.1)
Sodium: 137 mmol/L (ref 135–145)

## 2016-10-05 LAB — RETICULOCYTES
RBC.: 1.67 MIL/uL — ABNORMAL LOW (ref 3.87–5.11)
Retic Ct Pct: >23 % — ABNORMAL HIGH (ref 0.4–3.1)

## 2016-10-05 MED ORDER — HYDROMORPHONE HCL 1 MG/ML IJ SOLN
1.0000 mg | Freq: Once | INTRAMUSCULAR | Status: AC
  Administered 2016-10-05: 1 mg via INTRAVENOUS

## 2016-10-05 MED ORDER — DIPHENHYDRAMINE HCL 25 MG PO CAPS
25.0000 mg | ORAL_CAPSULE | Freq: Three times a day (TID) | ORAL | Status: DC | PRN
  Administered 2016-10-05: 25 mg via ORAL
  Filled 2016-10-05: qty 1

## 2016-10-05 MED ORDER — HYDROXYUREA 500 MG PO CAPS
1000.0000 mg | ORAL_CAPSULE | Freq: Every day | ORAL | Status: DC
  Administered 2016-10-05 – 2016-10-06 (×2): 1000 mg via ORAL
  Filled 2016-10-05 (×3): qty 2

## 2016-10-05 NOTE — Progress Notes (Signed)
SICKLE CELL SERVICE PROGRESS NOTE  Dawn [name] [id number] DOB: [birth year] DOA: 10/02/2016 PCP: Angelica Chessman, MD  Assessment/Plan: Principal Problem:   Sickle cell pain crisis ([name]) Active Problems:   Chest pain   Sickle cell crisis ([name])  1. Hb SS with crisis: Increase PCA bolus to 0.7 mg, and continue Toradol. Pt eating and drinking well so will decrease IVF. To KVO. Will give a bolus dose of 1 mg of Dilaudid now and encourage increased use of the PCA.  Re-assess pain control later today. 2. Anemia of Chronic Disease: Hb decreased to 6.0 g/dL. However she has a very robust reticulocytosis so will continue Hydrea to induce H F instead of Hb S.  Will continue to monitor. Continue Hydrea.  3. Chronic pain: Continue MS Contin   Code Status: Full Code Family Communication: N/A Disposition Plan: Not yet ready for discharge  [place].  Pager [phone]. If 7PM-7AM, please contact night-coverage.  10/05/2016, 1:22 PM  LOS: 2 days   Interim History: Pt rates her pain as 8-9/10 and localized to legs and back and chest wall. She has used only 26 mg with 31/30:demands/deliveries on the PCA. Pt denies falling asleep with the amount of dilaudid dispensed on the PAC bolus dose.  Consultants:  None  Procedures:  None  Antibiotics:  None   Objective: Vitals:   10/05/16 0543 10/05/16 0957 10/05/16 1144 10/05/16 1300  BP: 125/78   135/84  Pulse: (!) 104   96  Resp: 16 16 14 16   Temp: 98.7 F (37.1 C)   98.8 F (37.1 C)  TempSrc: Oral   Oral  SpO2: 94% 94% 95% 99%  Weight:      Height:       Weight change:   Intake/Output Summary (Last 24 hours) at 10/05/16 1322 Last data filed at 10/05/16 0900  Gross per 24 hour  Intake             3200 ml  Output             2600 ml  Net              600 ml    General: Alert, awake, oriented x3, in mild to moderate distress.  HEENT: [place]/AT PEERL, EOMI, moderate icterus which is his baseline. Neck: Trachea midline,  no  masses, no thyromegal,y no JVD, no carotid bruit OROPHARYNX:  Moist, No exudate/ erythema/lesions.  Heart: Regular rate and rhythm, without murmurs, rubs, gallops, PMI non-displaced, no heaves or thrills on palpation.  Lungs: Clear to auscultation, no wheezing or rhonchi noted. No increased vocal fremitus resonant to percussion  Abdomen: Soft, nontender, nondistended, positive bowel sounds, no masses no hepatosplenomegaly noted.  Neuro: No focal neurological deficits noted cranial nerves II through XII grossly intact.  Strength at functional baseline in bilateral upper and lower extremities. Musculoskeletal: No warmth swelling or erythema around joints, no spinal tenderness noted. Psychiatric: Patient alert and oriented x3, good insight and cognition, good recent to remote recall.    Data Reviewed: Basic Metabolic Panel:  Recent Labs Lab 09/30/16 1805 10/02/16 2018 10/03/16 0623 10/05/16 0530  NA 138 136 136 137  K 3.5 3.1* 3.1* 3.5  CL 106 105 106 108  CO2 24 25 25 24   GLUCOSE 93 109* 118* 114*  BUN 6 5* 7 6  CREATININE 0.43* 0.52 0.83 0.35*  CALCIUM 8.6* 8.4* 8.1* 7.9*   Liver Function Tests:  Recent Labs Lab 09/30/16 1805 10/02/16 2018 10/03/16 0623  AST 86* 86*  83*  ALT 28 27 25   ALKPHOS 125 113 107  BILITOT 15.1* 15.4* 14.6*  PROT 7.9 7.6 7.3  ALBUMIN 2.8* 2.7* 2.6*   No results for input(s): LIPASE, AMYLASE in the last 168 hours. No results for input(s): AMMONIA in the last 168 hours. CBC:  Recent Labs Lab 09/30/16 1805 10/02/16 2018 10/03/16 0623 10/05/16 0530  WBC 9.0 10.2 9.3 11.1*  NEUTROABS 5.2 5.5 5.0 6.5  HGB 7.8* 7.4* 7.0* 6.0*  HCT 21.0* 20.0* 19.3* 15.9*  MCV 95.5 96.2 96.0 95.2  PLT 159 148* 131* 120*   Cardiac Enzymes:  Recent Labs Lab 09/30/16 1805 10/03/16 0420 10/03/16 0724  TROPONINI <0.03 <0.03 <0.03   BNP (last 3 results) No results for input(s): BNP in the last 8760 hours.  ProBNP (last 3 results) No results for  input(s): PROBNP in the last 8760 hours.  CBG: No results for input(s): GLUCAP in the last 168 hours.  No results found for this or any previous visit (from the past 240 hour(s)).   Studies: Dg Chest 2 View  Result Date: 10/02/2016 CLINICAL DATA:  Sickle-cell crisis.  Short of breath. EXAM: CHEST  2 VIEW COMPARISON:  09/30/2016 FINDINGS: Mild cardiac enlargement. Lungs are clear without infiltrate effusion or edema. Port-A-Cath tip in the lower SVC unchanged. No change from the prior study. IMPRESSION: No active cardiopulmonary disease. Electronically Signed   By: Franchot Gallo M.D.   On: 10/02/2016 19:09   Dg Chest 2 View  Result Date: 09/30/2016 CLINICAL DATA:  36 year old with current history of sickle cell disease, presenting with 2 week history of intermittent chest pain which acutely worsened today. EXAM: CHEST  2 VIEW COMPARISON:  08/03/2016, 04/28/2016 and earlier, including CTA chest 08/25/2015. FINDINGS: Cardiac silhouette upper normal in size to slightly enlarged, unchanged. Hilar and mediastinal contours otherwise unremarkable. Right jugular Port-A-Cath tip remains in the lower SVC, unchanged. Lungs clear. Bronchovascular markings normal. Pulmonary vascularity normal. No visible pleural effusions. No pneumothorax. Visualized bony thorax intact. IMPRESSION: Stable borderline heart size.  No acute cardiopulmonary disease. Electronically Signed   By: Evangeline Dakin M.D.   On: 09/30/2016 18:01   Ct Angio Chest Pe W Or Wo Contrast  Result Date: 10/03/2016 CLINICAL DATA:  Sickle cell anemia, worsening lower extremity and chest pain EXAM: CT ANGIOGRAPHY CHEST WITH CONTRAST TECHNIQUE: Multidetector CT imaging of the chest was performed using the standard protocol during bolus administration of intravenous contrast. Multiplanar CT image reconstructions and MIPs were obtained to evaluate the vascular anatomy. CONTRAST:  100 cc Isovue COMPARISON:  08/25/2015 FINDINGS: Cardiovascular: Borderline  cardiomegaly. No pericardial effusion. There is right IJ Port-A-Cath with tip in SVC right atrium junction. No pulmonary embolus No aortic aneurysm of aortic dissection. Mediastinum/Nodes: No mediastinal hematoma or adenopathy. Central airway is patent. Lungs/Pleura: Mild central venous congestion. Images of the lung parenchyma shows no infiltrate or pulmonary edema. No focal consolidation. Mild dependent atelectasis noted bilateral lower lobe posteriorly. There is no pneumothorax. No bronchiectasis. No emphysema. Upper Abdomen: The visualized upper abdomen shows status post cholecystectomy. No adrenal gland mass. There is nodular contour of the liver consistent with cirrhosis. Lobulated contour bilateral kidney. Musculoskeletal: Stable sclerotic bony changes within thoracic spine and sternum consistent with history of sickle cell anemia. No bone destruction or acute fracture. Stable sclerotic changes bilateral proximal humerus. Review of the MIP images confirms the above findings. IMPRESSION: 1. No pulmonary embolus is noted. 2. No mediastinal hematoma or adenopathy. 3. Borderline cardiomegaly.  No pericardial effusion. 4. Central  mild venous congestion without pulmonary edema. No segmental consolidation. Mild dependent atelectasis bilateral lower lobe posteriorly. No pneumothorax. 5. Stable chronic sclerotic bony changes. 6. Again noted nodular contour of the liver consistent with cirrhosis. Status post cholecystectomy. Electronically Signed   By: Lahoma Crocker M.D.   On: 10/03/2016 08:40    Scheduled Meds: . enoxaparin (LOVENOX) injection  40 mg Subcutaneous Q24H  . folic acid  1 mg Oral Daily  . HYDROmorphone   Intravenous Q4H  .  HYDROmorphone (DILAUDID) injection  1 mg Intravenous Once  . ketorolac  30 mg Intravenous Q6H  . loratadine  10 mg Oral Daily  . methocarbamol  500 mg Oral TID  . morphine  15 mg Oral Q12H  . multivitamin with minerals  1 tablet Oral Daily  . potassium chloride  40 mEq Oral  BID  . senna-docusate  1 tablet Oral BID  . [START ON 10/07/2016] Vitamin D (Ergocalciferol)  50,000 Units Oral Q7 days   Continuous Infusions: . dextrose 5 % and 0.45% NaCl 100 mL/hr at 10/05/16 [id number]    Principal Problem:   Sickle cell pain crisis [facility]) Active Problems:   Chest pain   Sickle cell crisis (HCC)     In excess of 25 minutes spent during this consult. Greater than 50% involved face to face contact with the patient for assessment, counseling and coordination of care.

## 2016-10-05 NOTE — Progress Notes (Signed)
PHARMACY BRIEF NOTE: HYDROXYUREA   By [facility] Health policy, hydroxyurea is automatically held when any of the following laboratory values occur:  ANC < 2 K  Pltc < 80K in sickle-cell patients; < 100K in other patients  Hgb <= 6 in sickle-cell patients; < 8 in other patients  Reticulocytes < 80K when Hgb < 9  Hydroxyurea has been held (discontinued from profile) per policy, pending review by prescriber.   Reuel Boom, PharmD, BCPS Pager: [phone] 10/05/2016, 7:40 AM

## 2016-10-06 ENCOUNTER — Inpatient Hospital Stay (HOSPITAL_COMMUNITY): Payer: Medicare Other

## 2016-10-06 DIAGNOSIS — G894 Chronic pain syndrome: Secondary | ICD-10-CM

## 2016-10-06 DIAGNOSIS — M7989 Other specified soft tissue disorders: Secondary | ICD-10-CM

## 2016-10-06 DIAGNOSIS — M79609 Pain in unspecified limb: Secondary | ICD-10-CM

## 2016-10-06 DIAGNOSIS — D596 Hemoglobinuria due to hemolysis from other external causes: Secondary | ICD-10-CM

## 2016-10-06 NOTE — Progress Notes (Signed)
Sharman Crate, RN witnessed wasting 2 mg diluadid in sink from PCA diluadid syringe.

## 2016-10-06 NOTE — Progress Notes (Signed)
VASCULAR LAB PRELIMINARY  PRELIMINARY  PRELIMINARY  PRELIMINARY  Right upper extremity venous duplex completed.    Preliminary report:  There is no DVT or SVT noted in the right upper extremity.   Pharaoh Pio, RVT 10/06/2016, 6:37 PM

## 2016-10-06 NOTE — Progress Notes (Signed)
SICKLE CELL SERVICE PROGRESS NOTE  Dawn [name] [id number] DOB: March 11, 1981 DOA: 10/02/2016 PCP: Angelica Chessman, MD  Assessment/Plan: Principal Problem:   Sickle cell pain crisis ([place]) Active Problems:   Chest pain   Sickle cell crisis ([place])  1. Hb SS with crisis: Continue PCA at 0.7 mg bolus, clinician assisted dose and  Toradol. Pt eating and drinking well so will decrease IVF. To KVO. 2. Anemia of Chronic Disease: Hb decreased to 6.0 g/dL yesterday. However she has a very robust reticulocytosis so will continue Hydrea and check Hb tomorrow however given the increased jaundice on examination anticipate will need transfusionm tomorrow. 3. Swelling Right Arm: Will obtain Duplex U/S to evaluate for DVT.  4. Chronic pain: Continue MS Contin   Code Status: Full Code Family Communication: N/A Disposition Plan: Not yet ready for discharge  [place].  Pager [phone]. If 7PM-7AM, please contact night-coverage.  10/06/2016, 4:52 PM  LOS: 3 days   Interim History: Pt rates her pain as 8-9/10 and localized to legs and back and chest wall. She has used only 26 mg with 31/30:demands/deliveries on the PCA. She has not been receiving the Bolus doses of Dilaudid as ordered.   Consultants:  None  Procedures:  None  Antibiotics:  None   Objective: Vitals:   10/06/16 1011 10/06/16 1210 10/06/16 1354 10/06/16 1638  BP:   106/62   Pulse:   98   Resp: 18 (!) 22 18 14   Temp:   99 F (37.2 C)   TempSrc:   Oral   SpO2: 95% 93% 92% 99%  Weight:      Height:       Weight change:   Intake/Output Summary (Last 24 hours) at 10/06/16 1652 Last data filed at 10/06/16 1354  Gross per 24 hour  Intake          2423.33 ml  Output              600 ml  Net          1823.33 ml    General: Alert, awake, oriented x3, in mild to moderate distress.  HEENT: [place]/AT PEERL, EOMI, moderate icterus increased over baseline.  Neck: Trachea midline,  no masses, no thyromegal,y no JVD, no  carotid bruit Heart: Regular rate and rhythm, without murmurs, rubs, gallops, PMI non-displaced, no heaves or thrills on palpation.  Lungs: Clear to auscultation, no wheezing or rhonchi noted. No increased vocal fremitus resonant to percussion  Abdomen:Obese, soft, nontender, nondistended, positive bowel sounds, no masses no hepatosplenomegaly noted.  Neuro: No focal neurological deficits noted cranial nerves II through XII grossly intact.  Strength at functional baseline in bilateral upper and lower extremities. Musculoskeletal: No warmth swelling or erythema around joints, no spinal tenderness noted. However she does have swelling of the right arm.  Psychiatric: Patient alert and oriented x3, good insight and cognition, good recent to remote recall.    Data Reviewed: Basic Metabolic Panel:  Recent Labs Lab 09/30/16 1805 10/02/16 2018 10/03/16 0623 10/05/16 0530  NA 138 136 136 137  K 3.5 3.1* 3.1* 3.5  CL 106 105 106 108  CO2 24 25 25 24   GLUCOSE 93 109* 118* 114*  BUN 6 5* 7 6  CREATININE 0.43* 0.52 0.83 0.35*  CALCIUM 8.6* 8.4* 8.1* 7.9*   Liver Function Tests:  Recent Labs Lab 09/30/16 1805 10/02/16 2018 10/03/16 0623  AST 86* 86* 83*  ALT 28 27 25   ALKPHOS 125 113 107  BILITOT 15.1* 15.4*  14.6*  PROT 7.9 7.6 7.3  ALBUMIN 2.8* 2.7* 2.6*   No results for input(s): LIPASE, AMYLASE in the last 168 hours. No results for input(s): AMMONIA in the last 168 hours. CBC:  Recent Labs Lab 09/30/16 1805 10/02/16 2018 10/03/16 0623 10/05/16 0530  WBC 9.0 10.2 9.3 11.1*  NEUTROABS 5.2 5.5 5.0 6.5  HGB 7.8* 7.4* 7.0* 6.0*  HCT 21.0* 20.0* 19.3* 15.9*  MCV 95.5 96.2 96.0 95.2  PLT 159 148* 131* 120*   Cardiac Enzymes:  Recent Labs Lab 09/30/16 1805 10/03/16 0420 10/03/16 0724  TROPONINI <0.03 <0.03 <0.03   BNP (last 3 results) No results for input(s): BNP in the last 8760 hours.  ProBNP (last 3 results) No results for input(s): PROBNP in the last 8760  hours.  CBG: No results for input(s): GLUCAP in the last 168 hours.  No results found for this or any previous visit (from the past 240 hour(s)).   Studies: Dg Chest 2 View  Result Date: 10/02/2016 CLINICAL DATA:  Sickle-cell crisis.  Short of breath. EXAM: CHEST  2 VIEW COMPARISON:  09/30/2016 FINDINGS: Mild cardiac enlargement. Lungs are clear without infiltrate effusion or edema. Port-A-Cath tip in the lower SVC unchanged. No change from the prior study. IMPRESSION: No active cardiopulmonary disease. Electronically Signed   By: Franchot Gallo M.D.   On: 10/02/2016 19:09   Dg Chest 2 View  Result Date: 09/30/2016 CLINICAL DATA:  36 year old with current history of sickle cell disease, presenting with 2 week history of intermittent chest pain which acutely worsened today. EXAM: CHEST  2 VIEW COMPARISON:  08/03/2016, 04/28/2016 and earlier, including CTA chest 08/25/2015. FINDINGS: Cardiac silhouette upper normal in size to slightly enlarged, unchanged. Hilar and mediastinal contours otherwise unremarkable. Right jugular Port-A-Cath tip remains in the lower SVC, unchanged. Lungs clear. Bronchovascular markings normal. Pulmonary vascularity normal. No visible pleural effusions. No pneumothorax. Visualized bony thorax intact. IMPRESSION: Stable borderline heart size.  No acute cardiopulmonary disease. Electronically Signed   By: Evangeline Dakin M.D.   On: 09/30/2016 18:01   Ct Angio Chest Pe W Or Wo Contrast  Result Date: 10/03/2016 CLINICAL DATA:  Sickle cell anemia, worsening lower extremity and chest pain EXAM: CT ANGIOGRAPHY CHEST WITH CONTRAST TECHNIQUE: Multidetector CT imaging of the chest was performed using the standard protocol during bolus administration of intravenous contrast. Multiplanar CT image reconstructions and MIPs were obtained to evaluate the vascular anatomy. CONTRAST:  100 cc Isovue COMPARISON:  08/25/2015 FINDINGS: Cardiovascular: Borderline cardiomegaly. No pericardial  effusion. There is right IJ Port-A-Cath with tip in SVC right atrium junction. No pulmonary embolus No aortic aneurysm of aortic dissection. Mediastinum/Nodes: No mediastinal hematoma or adenopathy. Central airway is patent. Lungs/Pleura: Mild central venous congestion. Images of the lung parenchyma shows no infiltrate or pulmonary edema. No focal consolidation. Mild dependent atelectasis noted bilateral lower lobe posteriorly. There is no pneumothorax. No bronchiectasis. No emphysema. Upper Abdomen: The visualized upper abdomen shows status post cholecystectomy. No adrenal gland mass. There is nodular contour of the liver consistent with cirrhosis. Lobulated contour bilateral kidney. Musculoskeletal: Stable sclerotic bony changes within thoracic spine and sternum consistent with history of sickle cell anemia. No bone destruction or acute fracture. Stable sclerotic changes bilateral proximal humerus. Review of the MIP images confirms the above findings. IMPRESSION: 1. No pulmonary embolus is noted. 2. No mediastinal hematoma or adenopathy. 3. Borderline cardiomegaly.  No pericardial effusion. 4. Central mild venous congestion without pulmonary edema. No segmental consolidation. Mild dependent atelectasis bilateral lower lobe  posteriorly. No pneumothorax. 5. Stable chronic sclerotic bony changes. 6. Again noted nodular contour of the liver consistent with cirrhosis. Status post cholecystectomy. Electronically Signed   By: Lahoma Crocker M.D.   On: 10/03/2016 08:40    Scheduled Meds: . enoxaparin (LOVENOX) injection  40 mg Subcutaneous Q24H  . folic acid  1 mg Oral Daily  . HYDROmorphone   Intravenous Q4H  . hydroxyurea  1,000 mg Oral Daily  . ketorolac  30 mg Intravenous Q6H  . loratadine  10 mg Oral Daily  . morphine  15 mg Oral Q12H  . multivitamin with minerals  1 tablet Oral Daily  . potassium chloride  40 mEq Oral BID  . senna-docusate  1 tablet Oral BID  . [START ON 10/07/2016] Vitamin D  (Ergocalciferol)  50,000 Units Oral Q7 days   Continuous Infusions: . dextrose 5 % and 0.45% NaCl 100 mL/hr at 10/06/16 1435    Principal Problem:   Sickle cell pain crisis [facility]) Active Problems:   Chest pain   Sickle cell crisis (HCC)     In excess of 25 minutes spent during this consult. Greater than 50% involved face to face contact with the patient for assessment, counseling and coordination of care.

## 2016-10-07 LAB — CBC WITH DIFFERENTIAL/PLATELET
BASOS PCT: 0 %
Basophils Absolute: 0 10*3/uL (ref 0.0–0.1)
EOS PCT: 3 %
Eosinophils Absolute: 0.4 10*3/uL (ref 0.0–0.7)
HEMATOCRIT: 13.7 % — AB (ref 36.0–46.0)
HEMOGLOBIN: 5.1 g/dL — AB (ref 12.0–15.0)
LYMPHS ABS: 2.7 10*3/uL (ref 0.7–4.0)
Lymphocytes Relative: 20 %
MCH: 35.2 pg — ABNORMAL HIGH (ref 26.0–34.0)
MCHC: 37.2 g/dL — ABNORMAL HIGH (ref 30.0–36.0)
MCV: 94.5 fL (ref 78.0–100.0)
MONOS PCT: 15 %
Monocytes Absolute: 2 10*3/uL — ABNORMAL HIGH (ref 0.1–1.0)
NEUTROS PCT: 62 %
Neutro Abs: 8.4 10*3/uL — ABNORMAL HIGH (ref 1.7–7.7)
Platelets: 96 10*3/uL — ABNORMAL LOW (ref 150–400)
RBC: 1.45 MIL/uL — ABNORMAL LOW (ref 3.87–5.11)
RDW: 24.2 % — AB (ref 11.5–15.5)
WBC: 13.5 10*3/uL — ABNORMAL HIGH (ref 4.0–10.5)
nRBC: 12 /100 WBC — ABNORMAL HIGH

## 2016-10-07 LAB — RETICULOCYTES
RBC.: 1.45 MIL/uL — AB (ref 3.87–5.11)
Retic Ct Pct: >23 % — ABNORMAL HIGH (ref 0.4–3.1)

## 2016-10-07 LAB — PREPARE RBC (CROSSMATCH)

## 2016-10-07 MED ORDER — SODIUM CHLORIDE 0.9 % IV SOLN
Freq: Once | INTRAVENOUS | Status: AC
  Administered 2016-10-07: 05:00:00 via INTRAVENOUS

## 2016-10-07 NOTE — Progress Notes (Signed)
CRITICAL VALUE ALERT  Critical value received:  Hgb 5.1  Date of notification:  10-07-16  Time of notification:  4:55am  Critical value read back:  yes  Nurse who received alert:  Henrietta Dine, RN  MD notified (1st page):  Hospitalist on call  Time of first page:  4:58am  MD notified (2nd page):  Time of second page:  Responding MD:    Time MD responded:

## 2016-10-07 NOTE — Care Management Important Message (Signed)
Important Message  Patient Details  Name: KAITHLYN BESA MRN: [id number] Date of Birth: 08/18/1981   Medicare Important Message Given:  Yes    Apolonio Schneiders, RN 10/07/2016, 11:54 AM

## 2016-10-08 LAB — TYPE AND SCREEN
BLOOD PRODUCT EXPIRATION DATE: 201801262359
ISSUE DATE / TIME: 201801141404
Unit Type and Rh: 6200

## 2016-10-08 LAB — CBC
HEMATOCRIT: 17.8 % — AB (ref 36.0–46.0)
HEMOGLOBIN: 6.5 g/dL — AB (ref 12.0–15.0)
MCH: 33.5 pg (ref 26.0–34.0)
MCHC: 36.5 g/dL — ABNORMAL HIGH (ref 30.0–36.0)
MCV: 91.8 fL (ref 78.0–100.0)
Platelets: 113 10*3/uL — ABNORMAL LOW (ref 150–400)
RBC: 1.94 MIL/uL — AB (ref 3.87–5.11)
RDW: 22.6 % — ABNORMAL HIGH (ref 11.5–15.5)
WBC: 10.8 10*3/uL — AB (ref 4.0–10.5)

## 2016-10-08 LAB — RETICULOCYTES
RBC.: 1.85 MIL/uL — ABNORMAL LOW (ref 3.87–5.11)
Retic Ct Pct: >23 % — ABNORMAL HIGH (ref 0.4–3.1)

## 2016-10-08 LAB — DIFFERENTIAL
Basophils Absolute: 0 10*3/uL (ref 0.0–0.1)
Basophils Relative: 0 %
EOS PCT: 3 %
Eosinophils Absolute: 0.3 10*3/uL (ref 0.0–0.7)
Lymphocytes Relative: 31 %
Lymphs Abs: 3.3 10*3/uL (ref 0.7–4.0)
MONO ABS: 1.5 10*3/uL — AB (ref 0.1–1.0)
MONOS PCT: 14 %
Neutro Abs: 5.7 10*3/uL (ref 1.7–7.7)
Neutrophils Relative %: 52 %

## 2016-10-08 LAB — LACTATE DEHYDROGENASE: LDH: 409 U/L — ABNORMAL HIGH (ref 98–192)

## 2016-10-08 NOTE — Progress Notes (Signed)
SICKLE CELL SERVICE PROGRESS NOTE  Dawn [name] [id number] DOB: [birth year] DOA: 10/02/2016 PCP: Angelica Chessman, MD  Assessment/Plan: Principal Problem:   Sickle cell pain crisis ([place]) Active Problems:   Chest pain   Sickle cell crisis ([place])  1. Hb SS with crisis: Continue PCA at 0.7 mg bolus, clinician assisted dose and  Toradol. Continue IVF at [facility]. 2. Anemia of Chronic Disease: Hb decreased to 6.0 g/dL yesterday. However she has a very robust reticulocytosis so will continue Hydrea and check Hb tomorrow however given the increased jaundice on examination anticipate will need transfusionm tomorrow. 3. Swelling Right Arm:  Duplex U/S negative for DVT. I am not convinced that this is related to her port as a recent CT chest shows normal vasculature in the chest cavity and the Duplex US is negative. Additionally she does not have elevated JVP to suggest right sided heart involvement. Will continue to monitor. 4. Chronic pain: Continue MS Contin   Code Status: Full Code Family Communication: N/A Disposition Plan: Not yet ready for discharge  [place].  Pager [phone]. If 7PM-7AM, please contact night-coverage.  10/08/2016, 3:02 PM  LOS: 5 days   Interim History: Pt rates her pain as 8/10 and localized to legs and back and chest wall. She has used 31.6 mg on the PCA in the last 24 hours. She had a BM yesterday.  Consultants:  None  Procedures:  None  Antibiotics:  None   Objective: Vitals:   10/07/16 0603 10/08/16 1003 10/08/16 1301 10/08/16 1449  BP: 120/63  114/61 120/72  Pulse: 95  85 89  Resp: 14 15 97 20  Temp: 98.2 F (36.8 C)  98.8 F (37.1 C) 97.6 F (36.4 C)  TempSrc: Oral  Oral Oral  SpO2: 94% 95% 93% 95%  Weight:      Height:       Weight change:   Intake/Output Summary (Last 24 hours) at 10/07/16 1502 Last data filed at 10/07/16 1501  Gross per 24 hour  Intake             1364 ml  Output                0 ml  Net             1364 ml     General: Alert, awake, oriented x3, in moderate distress.  HEENT: [place]/AT PEERL, EOMI, moderate icterus increased over baseline.  Neck: Trachea midline,  no masses, no thyromegal,y no JVD, no carotid bruit Heart: Regular rate and rhythm, without murmurs, rubs, gallops, PMI non-displaced, no heaves or thrills on palpation.  Lungs: Clear to auscultation, no wheezing or rhonchi noted. No increased vocal fremitus resonant to percussion  Abdomen:Obese, soft, nontender, nondistended, positive bowel sounds, no masses no hepatosplenomegaly noted.  Neuro: No focal neurological deficits noted cranial nerves II through XII grossly intact.  Strength at functional baseline in bilateral upper and lower extremities. Musculoskeletal: No warmth swelling or erythema around joints, no spinal tenderness noted. However she does have swelling of the right arm which is improved today.  Psychiatric: Patient alert and oriented x3, good insight and cognition, good recent to remote recall.    Data Reviewed: Basic Metabolic Panel:  Recent Labs Lab 10/02/16 2018 10/03/16 0623 10/05/16 0530  NA 136 136 137  K 3.1* 3.1* 3.5  CL 105 106 108  CO2 25 25 24   GLUCOSE 109* 118* 114*  BUN 5* 7 6  CREATININE 0.52 0.83 0.35*  CALCIUM 8.4* 8.1*  7.9*   Liver Function Tests:  Recent Labs Lab 10/02/16 2018 10/03/16 0623  AST 86* 83*  ALT 27 25  ALKPHOS 113 107  BILITOT 15.4* 14.6*  PROT 7.6 7.3  ALBUMIN 2.7* 2.6*   No results for input(s): LIPASE, AMYLASE in the last 168 hours. No results for input(s): AMMONIA in the last 168 hours. CBC:  Recent Labs Lab 10/02/16 2018 10/03/16 0623 10/05/16 0530 10/07/16 0345  WBC 10.2 9.3 11.1* 13.5*  NEUTROABS 5.5 5.0 6.5 8.4*  HGB 7.4* 7.0* 6.0* 5.1*  HCT 20.0* 19.3* 15.9* 13.7*  MCV 96.2 96.0 95.2 94.5  PLT 148* 131* 120* 96*   Cardiac Enzymes:  Recent Labs Lab 10/03/16 0420 10/03/16 0724  TROPONINI <0.03 <0.03   BNP (last 3 results) No results for  input(s): BNP in the last 8760 hours.  ProBNP (last 3 results) No results for input(s): PROBNP in the last 8760 hours.  CBG: No results for input(s): GLUCAP in the last 168 hours.  No results found for this or any previous visit (from the past 240 hour(s)).   Studies: Dg Chest 2 View  Result Date: 10/02/2016 CLINICAL DATA:  Sickle-cell crisis.  Short of breath. EXAM: CHEST  2 VIEW COMPARISON:  09/30/2016 FINDINGS: Mild cardiac enlargement. Lungs are clear without infiltrate effusion or edema. Port-A-Cath tip in the lower SVC unchanged. No change from the prior study. IMPRESSION: No active cardiopulmonary disease. Electronically Signed   By: Franchot Gallo M.D.   On: 10/02/2016 19:09   Dg Chest 2 View  Result Date: 09/30/2016 CLINICAL DATA:  36 year old with current history of sickle cell disease, presenting with 2 week history of intermittent chest pain which acutely worsened today. EXAM: CHEST  2 VIEW COMPARISON:  08/03/2016, 04/28/2016 and earlier, including CTA chest 08/25/2015. FINDINGS: Cardiac silhouette upper normal in size to slightly enlarged, unchanged. Hilar and mediastinal contours otherwise unremarkable. Right jugular Port-A-Cath tip remains in the lower SVC, unchanged. Lungs clear. Bronchovascular markings normal. Pulmonary vascularity normal. No visible pleural effusions. No pneumothorax. Visualized bony thorax intact. IMPRESSION: Stable borderline heart size.  No acute cardiopulmonary disease. Electronically Signed   By: Evangeline Dakin M.D.   On: 09/30/2016 18:01   Ct Angio Chest Pe W Or Wo Contrast  Result Date: 10/03/2016 CLINICAL DATA:  Sickle cell anemia, worsening lower extremity and chest pain EXAM: CT ANGIOGRAPHY CHEST WITH CONTRAST TECHNIQUE: Multidetector CT imaging of the chest was performed using the standard protocol during bolus administration of intravenous contrast. Multiplanar CT image reconstructions and MIPs were obtained to evaluate the vascular anatomy.  CONTRAST:  100 cc Isovue COMPARISON:  08/25/2015 FINDINGS: Cardiovascular: Borderline cardiomegaly. No pericardial effusion. There is right IJ Port-A-Cath with tip in SVC right atrium junction. No pulmonary embolus No aortic aneurysm of aortic dissection. Mediastinum/Nodes: No mediastinal hematoma or adenopathy. Central airway is patent. Lungs/Pleura: Mild central venous congestion. Images of the lung parenchyma shows no infiltrate or pulmonary edema. No focal consolidation. Mild dependent atelectasis noted bilateral lower lobe posteriorly. There is no pneumothorax. No bronchiectasis. No emphysema. Upper Abdomen: The visualized upper abdomen shows status post cholecystectomy. No adrenal gland mass. There is nodular contour of the liver consistent with cirrhosis. Lobulated contour bilateral kidney. Musculoskeletal: Stable sclerotic bony changes within thoracic spine and sternum consistent with history of sickle cell anemia. No bone destruction or acute fracture. Stable sclerotic changes bilateral proximal humerus. Review of the MIP images confirms the above findings. IMPRESSION: 1. No pulmonary embolus is noted. 2. No mediastinal hematoma or adenopathy.  3. Borderline cardiomegaly.  No pericardial effusion. 4. Central mild venous congestion without pulmonary edema. No segmental consolidation. Mild dependent atelectasis bilateral lower lobe posteriorly. No pneumothorax. 5. Stable chronic sclerotic bony changes. 6. Again noted nodular contour of the liver consistent with cirrhosis. Status post cholecystectomy. Electronically Signed   By: Lahoma Crocker M.D.   On: 10/03/2016 08:40    Scheduled Meds: . sodium chloride   Intravenous Once  . folic acid  1 mg Oral Daily  . HYDROmorphone   Intravenous Q4H  . ketorolac  30 mg Intravenous Q6H  . loratadine  10 mg Oral Daily  . morphine  15 mg Oral Q12H  . multivitamin with minerals  1 tablet Oral Daily  . potassium chloride  40 mEq Oral BID  . senna-docusate  1 tablet  Oral BID  . Vitamin D (Ergocalciferol)  50,000 Units Oral Q7 days   Continuous Infusions: . dextrose 5 % and 0.45% NaCl 10 mL/hr at 10/08/16 0433    Principal Problem:   Sickle cell pain crisis [facility]) Active Problems:   Chest pain   Sickle cell crisis (HCC)     In excess of 25 minutes spent during this consult. Greater than 50% involved face to face contact with the patient for assessment, counseling and coordination of care.

## 2016-10-08 NOTE — Progress Notes (Signed)
SICKLE CELL SERVICE PROGRESS NOTE  Dawn [name] [id number] DOB: December 30, 1980 DOA: 10/02/2016 PCP: Angelica Chessman, MD  Assessment/Plan: Principal Problem:   Sickle cell pain crisis ([place]) Active Problems:   Chest pain   Sickle cell crisis ([place])  1. Hb SS with crisis: Continue PCA at 0.7 mg bolus, clinician assisted dose and  Toradol. Increase IVF to 75 ml/hr 2. Anemia of Chronic Disease: Hb up to 6.5 g/dL post transfusion. Continue to hold Hydrea at least until tomorrow. I anticipate that she will require transfusion of another unit RBC's given the degree of jaundice on examination. 3. Thrombocytopenia: Platelet counts improved to 113 K today. Continue to hold.Lovenox and Hydrea.   4. Chronic pain: Continue MS Contin   Code Status: Full Code Family Communication: N/A Disposition Plan: Not yet ready for discharge  [place].  Pager [phone]. If 7PM-7AM, please contact night-coverage.  10/08/2016, 3:02 PM  LOS: 5 days   Interim History: Pt rates her pain as 8-9/10 and localized to legs and back and chest wall. She has used 38.8 mg with 37/34:demands/deliveries on the PCA. She had a BM today.   Consultants:  None  Procedures:  None  Antibiotics:  None   Objective: Vitals:   10/08/16 0623 10/08/16 1018 10/08/16 1140 10/08/16 1449  BP: 133/79  117/63 120/72  Pulse: 85  85 89  Resp: 19 18 18 20   Temp: 98.4 F (36.9 C)  98.3 F (36.8 C) 97.6 F (36.4 C)  TempSrc: Oral  Oral Oral  SpO2: 95% 96% 94% 95%  Weight:      Height:       Weight change:   Intake/Output Summary (Last 24 hours) at 10/08/16 1502 Last data filed at 10/08/16 1450  Gross per 24 hour  Intake             1364 ml  Output                0 ml  Net             1364 ml    General: Alert, awake, oriented x3, in mild to moderate distress.  HEENT: [place]/AT PEERL, EOMI, moderate icterus increased over baseline.  Neck: Trachea midline,  no masses, no thyromegal,y no JVD, no carotid  bruit Heart: Regular rate and rhythm, without murmurs, rubs, gallops, PMI non-displaced, no heaves or thrills on palpation.  Lungs: Clear to auscultation, no wheezing or rhonchi noted. No increased vocal fremitus resonant to percussion  Abdomen:Obese, soft, nontender, nondistended, positive bowel sounds, no masses no hepatosplenomegaly noted.  Neuro: No focal neurological deficits noted cranial nerves II through XII grossly intact.  Strength at functional baseline in bilateral upper and lower extremities. Musculoskeletal: No warmth swelling or erythema around joints, no spinal tenderness noted. Swelling of the right arm improved but still present. No warmth or erythema noted.  Psychiatric: Patient alert and oriented x3, good insight and cognition, good recent to remote recall.    Data Reviewed: Basic Metabolic Panel:  Recent Labs Lab 10/02/16 2018 10/03/16 0623 10/05/16 0530  NA 136 136 137  K 3.1* 3.1* 3.5  CL 105 106 108  CO2 25 25 24   GLUCOSE 109* 118* 114*  BUN 5* 7 6  CREATININE 0.52 0.83 0.35*  CALCIUM 8.4* 8.1* 7.9*   Liver Function Tests:  Recent Labs Lab 10/02/16 2018 10/03/16 0623  AST 86* 83*  ALT 27 25  ALKPHOS 113 107  BILITOT 15.4* 14.6*  PROT 7.6 7.3  ALBUMIN 2.7* 2.6*  No results for input(s): LIPASE, AMYLASE in the last 168 hours. No results for input(s): AMMONIA in the last 168 hours. CBC:  Recent Labs Lab 10/02/16 2018 10/03/16 0623 10/05/16 0530 10/07/16 0345 10/07/16 2054  WBC 10.2 9.3 11.1* 13.5* 10.8*  NEUTROABS 5.5 5.0 6.5 8.4* 5.7  HGB 7.4* 7.0* 6.0* 5.1* 6.5*  HCT 20.0* 19.3* 15.9* 13.7* 17.8*  MCV 96.2 96.0 95.2 94.5 91.8  PLT 148* 131* 120* 96* 113*   Cardiac Enzymes:  Recent Labs Lab 10/03/16 0420 10/03/16 0724  TROPONINI <0.03 <0.03   BNP (last 3 results) No results for input(s): BNP in the last 8760 hours.  ProBNP (last 3 results) No results for input(s): PROBNP in the last 8760 hours.  CBG: No results for  input(s): GLUCAP in the last 168 hours.  No results found for this or any previous visit (from the past 240 hour(s)).   Studies: Dg Chest 2 View  Result Date: 10/02/2016 CLINICAL DATA:  Sickle-cell crisis.  Short of breath. EXAM: CHEST  2 VIEW COMPARISON:  09/30/2016 FINDINGS: Mild cardiac enlargement. Lungs are clear without infiltrate effusion or edema. Port-A-Cath tip in the lower SVC unchanged. No change from the prior study. IMPRESSION: No active cardiopulmonary disease. Electronically Signed   By: Franchot Gallo M.D.   On: 10/02/2016 19:09   Dg Chest 2 View  Result Date: 09/30/2016 CLINICAL DATA:  36 year old with current history of sickle cell disease, presenting with 2 week history of intermittent chest pain which acutely worsened today. EXAM: CHEST  2 VIEW COMPARISON:  08/03/2016, 04/28/2016 and earlier, including CTA chest 08/25/2015. FINDINGS: Cardiac silhouette upper normal in size to slightly enlarged, unchanged. Hilar and mediastinal contours otherwise unremarkable. Right jugular Port-A-Cath tip remains in the lower SVC, unchanged. Lungs clear. Bronchovascular markings normal. Pulmonary vascularity normal. No visible pleural effusions. No pneumothorax. Visualized bony thorax intact. IMPRESSION: Stable borderline heart size.  No acute cardiopulmonary disease. Electronically Signed   By: Evangeline Dakin M.D.   On: 09/30/2016 18:01   Ct Angio Chest Pe W Or Wo Contrast  Result Date: 10/03/2016 CLINICAL DATA:  Sickle cell anemia, worsening lower extremity and chest pain EXAM: CT ANGIOGRAPHY CHEST WITH CONTRAST TECHNIQUE: Multidetector CT imaging of the chest was performed using the standard protocol during bolus administration of intravenous contrast. Multiplanar CT image reconstructions and MIPs were obtained to evaluate the vascular anatomy. CONTRAST:  100 cc Isovue COMPARISON:  08/25/2015 FINDINGS: Cardiovascular: Borderline cardiomegaly. No pericardial effusion. There is right IJ  Port-A-Cath with tip in SVC right atrium junction. No pulmonary embolus No aortic aneurysm of aortic dissection. Mediastinum/Nodes: No mediastinal hematoma or adenopathy. Central airway is patent. Lungs/Pleura: Mild central venous congestion. Images of the lung parenchyma shows no infiltrate or pulmonary edema. No focal consolidation. Mild dependent atelectasis noted bilateral lower lobe posteriorly. There is no pneumothorax. No bronchiectasis. No emphysema. Upper Abdomen: The visualized upper abdomen shows status post cholecystectomy. No adrenal gland mass. There is nodular contour of the liver consistent with cirrhosis. Lobulated contour bilateral kidney. Musculoskeletal: Stable sclerotic bony changes within thoracic spine and sternum consistent with history of sickle cell anemia. No bone destruction or acute fracture. Stable sclerotic changes bilateral proximal humerus. Review of the MIP images confirms the above findings. IMPRESSION: 1. No pulmonary embolus is noted. 2. No mediastinal hematoma or adenopathy. 3. Borderline cardiomegaly.  No pericardial effusion. 4. Central mild venous congestion without pulmonary edema. No segmental consolidation. Mild dependent atelectasis bilateral lower lobe posteriorly. No pneumothorax. 5. Stable chronic sclerotic bony  changes. 6. Again noted nodular contour of the liver consistent with cirrhosis. Status post cholecystectomy. Electronically Signed   By: Lahoma Crocker M.D.   On: 10/03/2016 08:40    Scheduled Meds: . sodium chloride   Intravenous Once  . folic acid  1 mg Oral Daily  . HYDROmorphone   Intravenous Q4H  . ketorolac  30 mg Intravenous Q6H  . loratadine  10 mg Oral Daily  . morphine  15 mg Oral Q12H  . multivitamin with minerals  1 tablet Oral Daily  . potassium chloride  40 mEq Oral BID  . senna-docusate  1 tablet Oral BID  . Vitamin D (Ergocalciferol)  50,000 Units Oral Q7 days   Continuous Infusions: . dextrose 5 % and 0.45% NaCl 10 mL/hr at 10/08/16  0433    Principal Problem:   Sickle cell pain crisis [facility]) Active Problems:   Chest pain   Sickle cell crisis (HCC)     In excess of 25 minutes spent during this consult. Greater than 50% involved face to face contact with the patient for assessment, counseling and coordination of care.

## 2016-10-09 LAB — RETICULOCYTES
RBC.: 1.81 MIL/uL — ABNORMAL LOW (ref 3.87–5.11)
RETIC COUNT ABSOLUTE: 479.7 10*3/uL — AB (ref 19.0–186.0)
RETIC CT PCT: 26.5 % — AB (ref 0.4–3.1)

## 2016-10-09 LAB — CBC WITH DIFFERENTIAL/PLATELET
Basophils Absolute: 0 10*3/uL (ref 0.0–0.1)
Basophils Relative: 0 %
EOS PCT: 3 %
Eosinophils Absolute: 0.3 10*3/uL (ref 0.0–0.7)
HCT: 16.8 % — ABNORMAL LOW (ref 36.0–46.0)
HEMOGLOBIN: 6.3 g/dL — AB (ref 12.0–15.0)
LYMPHS ABS: 2.1 10*3/uL (ref 0.7–4.0)
Lymphocytes Relative: 20 %
MCH: 34.8 pg — AB (ref 26.0–34.0)
MCHC: 37.5 g/dL — ABNORMAL HIGH (ref 30.0–36.0)
MCV: 92.8 fL (ref 78.0–100.0)
MONOS PCT: 18 %
Monocytes Absolute: 1.9 10*3/uL — ABNORMAL HIGH (ref 0.1–1.0)
NEUTROS ABS: 6 10*3/uL (ref 1.7–7.7)
Neutrophils Relative %: 59 %
Platelets: 104 10*3/uL — ABNORMAL LOW (ref 150–400)
RBC: 1.81 MIL/uL — ABNORMAL LOW (ref 3.87–5.11)
RDW: 23.2 % — ABNORMAL HIGH (ref 11.5–15.5)
WBC: 10.3 10*3/uL (ref 4.0–10.5)

## 2016-10-09 MED ORDER — HYDROMORPHONE 1 MG/ML IV SOLN
INTRAVENOUS | Status: DC
  Administered 2016-10-09: 2 mg via INTRAVENOUS
  Administered 2016-10-09: 3.69 mg via INTRAVENOUS
  Administered 2016-10-09: 6.5 mg via INTRAVENOUS
  Administered 2016-10-10: 1.4 mg via INTRAVENOUS
  Administered 2016-10-10: 14.5 mg via INTRAVENOUS
  Administered 2016-10-10: 9 mg via INTRAVENOUS
  Administered 2016-10-10: 6.3 mg via INTRAVENOUS
  Administered 2016-10-10: 2 mg via INTRAVENOUS
  Administered 2016-10-10: 6.8 mg via INTRAVENOUS
  Administered 2016-10-10: 4.8 mg via INTRAVENOUS
  Administered 2016-10-11: 8.3 mg via INTRAVENOUS
  Administered 2016-10-11: 4.8 mg via INTRAVENOUS
  Filled 2016-10-09 (×3): qty 25

## 2016-10-09 NOTE — Progress Notes (Signed)
SICKLE CELL SERVICE PROGRESS NOTE  Dawn [name] [id number] DOB: [birth year] DOA: 10/02/2016 PCP: Angelica Chessman, MD  Assessment/Plan: Principal Problem:   Sickle cell pain crisis ([place]) Active Problems:   Chest pain   Sickle cell crisis ([place])  1. Hb SS with crisis: Continue PCA at 0.7 mg bolus, clinician assisted dose and  Toradol. Pt eating and drinking well so will decrease IVF. To KVO. 2. Anemia of Chronic Disease: Hb improved to 6.3 g/dL today. Hydrea on hold and will continue to hold until labs reviewed tomorrow. However she has a very robust reticulocytosis so will continue Hydrea and check Hb tomorrow. If improved  3. Swelling Right Arm: Pt continues to have a difference in size of extremities from side to side with Left larger than right. However this does not appear to be acute. I have thought about the impact of her port however this should not impact her lower extremity.  4. Chronic pain: Continue MS Contin   Code Status: Full Code Family Communication: N/A Disposition Plan: Not yet ready for discharge  [place].  Pager [phone]. If 7PM-7AM, please contact night-coverage.  10/09/2016, 2:51 PM  LOS: 6 days   Interim History: Pt rates her pain as 7-8/10 and localized to legs and back and chest wall. She has been using her PCA only minimally. I have discussed with her the importance of using the PCA to improve control of her pain.   Consultants:  None  Procedures:  None  Antibiotics:  None   Objective: Vitals:   10/09/16 0600 10/09/16 0800 10/09/16 0957 10/09/16 1438  BP: 129/75  133/75 133/76  Pulse: 88  94 (!) 107  Resp: 18 16 18 13   Temp: 98.7 F (37.1 C)  98.6 F (37 C) 98.7 F (37.1 C)  TempSrc: Oral  Oral Oral  SpO2: 96% 95% 98% 94%  Weight:      Height:       Weight change:   Intake/Output Summary (Last 24 hours) at 10/09/16 1451 Last data filed at 10/09/16 1439  Gross per 24 hour  Intake             1200 ml  Output                 0 ml  Net             1200 ml    General: Alert, awake, oriented x3, in mild distress.  HEENT: [place]/AT PEERL, EOMI, moderate icterus increased over baseline.  Neck: Trachea midline,  no masses, no thyromegal,y no JVD, no carotid bruit Heart: Regular rate and rhythm, without murmurs, rubs, gallops, PMI non-displaced, no heaves or thrills on palpation.  Lungs: Clear to auscultation, no wheezing or rhonchi noted. No increased vocal fremitus resonant to percussion.   Abdomen:Obese, soft, nontender, nondistended, positive bowel sounds, no masses no hepatosplenomegaly noted.  Neuro: No focal neurological deficits noted cranial nerves II through XII grossly intact.  Strength at functional baseline in bilateral upper and lower extremities. Musculoskeletal: No warmth swelling or erythema around joints, no spinal tenderness noted. However she does have swelling of the right arm.     Data Reviewed: Basic Metabolic Panel:  Recent Labs Lab 10/02/16 2018 10/03/16 0623 10/05/16 0530  NA 136 136 137  K 3.1* 3.1* 3.5  CL 105 106 108  CO2 25 25 24   GLUCOSE 109* 118* 114*  BUN 5* 7 6  CREATININE 0.52 0.83 0.35*  CALCIUM 8.4* 8.1* 7.9*   Liver Function Tests:  Recent Labs Lab 10/02/16 2018 10/03/16 0623  AST 86* 83*  ALT 27 25  ALKPHOS 113 107  BILITOT 15.4* 14.6*  PROT 7.6 7.3  ALBUMIN 2.7* 2.6*   No results for input(s): LIPASE, AMYLASE in the last 168 hours. No results for input(s): AMMONIA in the last 168 hours. CBC:  Recent Labs Lab 10/03/16 0623 10/05/16 0530 10/07/16 0345 10/07/16 2054 10/09/16 1225  WBC 9.3 11.1* 13.5* 10.8* 10.3  NEUTROABS 5.0 6.5 8.4* 5.7 6.0  HGB 7.0* 6.0* 5.1* 6.5* 6.3*  HCT 19.3* 15.9* 13.7* 17.8* 16.8*  MCV 96.0 95.2 94.5 91.8 92.8  PLT 131* 120* 96* 113* 104*   Cardiac Enzymes:  Recent Labs Lab 10/03/16 0420 10/03/16 0724  TROPONINI <0.03 <0.03   BNP (last 3 results) No results for input(s): BNP in the last 8760 hours.  ProBNP  (last 3 results) No results for input(s): PROBNP in the last 8760 hours.  CBG: No results for input(s): GLUCAP in the last 168 hours.  No results found for this or any previous visit (from the past 240 hour(s)).   Studies: Dg Chest 2 View  Result Date: 10/02/2016 CLINICAL DATA:  Sickle-cell crisis.  Short of breath. EXAM: CHEST  2 VIEW COMPARISON:  09/30/2016 FINDINGS: Mild cardiac enlargement. Lungs are clear without infiltrate effusion or edema. Port-A-Cath tip in the lower SVC unchanged. No change from the prior study. IMPRESSION: No active cardiopulmonary disease. Electronically Signed   By: Franchot Gallo M.D.   On: 10/02/2016 19:09   Dg Chest 2 View  Result Date: 09/30/2016 CLINICAL DATA:  36 year old with current history of sickle cell disease, presenting with 2 week history of intermittent chest pain which acutely worsened today. EXAM: CHEST  2 VIEW COMPARISON:  08/03/2016, 04/28/2016 and earlier, including CTA chest 08/25/2015. FINDINGS: Cardiac silhouette upper normal in size to slightly enlarged, unchanged. Hilar and mediastinal contours otherwise unremarkable. Right jugular Port-A-Cath tip remains in the lower SVC, unchanged. Lungs clear. Bronchovascular markings normal. Pulmonary vascularity normal. No visible pleural effusions. No pneumothorax. Visualized bony thorax intact. IMPRESSION: Stable borderline heart size.  No acute cardiopulmonary disease. Electronically Signed   By: Evangeline Dakin M.D.   On: 09/30/2016 18:01   Ct Angio Chest Pe W Or Wo Contrast  Result Date: 10/03/2016 CLINICAL DATA:  Sickle cell anemia, worsening lower extremity and chest pain EXAM: CT ANGIOGRAPHY CHEST WITH CONTRAST TECHNIQUE: Multidetector CT imaging of the chest was performed using the standard protocol during bolus administration of intravenous contrast. Multiplanar CT image reconstructions and MIPs were obtained to evaluate the vascular anatomy. CONTRAST:  100 cc Isovue COMPARISON:  08/25/2015  FINDINGS: Cardiovascular: Borderline cardiomegaly. No pericardial effusion. There is right IJ Port-A-Cath with tip in SVC right atrium junction. No pulmonary embolus No aortic aneurysm of aortic dissection. Mediastinum/Nodes: No mediastinal hematoma or adenopathy. Central airway is patent. Lungs/Pleura: Mild central venous congestion. Images of the lung parenchyma shows no infiltrate or pulmonary edema. No focal consolidation. Mild dependent atelectasis noted bilateral lower lobe posteriorly. There is no pneumothorax. No bronchiectasis. No emphysema. Upper Abdomen: The visualized upper abdomen shows status post cholecystectomy. No adrenal gland mass. There is nodular contour of the liver consistent with cirrhosis. Lobulated contour bilateral kidney. Musculoskeletal: Stable sclerotic bony changes within thoracic spine and sternum consistent with history of sickle cell anemia. No bone destruction or acute fracture. Stable sclerotic changes bilateral proximal humerus. Review of the MIP images confirms the above findings. IMPRESSION: 1. No pulmonary embolus is noted. 2. No mediastinal hematoma or  adenopathy. 3. Borderline cardiomegaly.  No pericardial effusion. 4. Central mild venous congestion without pulmonary edema. No segmental consolidation. Mild dependent atelectasis bilateral lower lobe posteriorly. No pneumothorax. 5. Stable chronic sclerotic bony changes. 6. Again noted nodular contour of the liver consistent with cirrhosis. Status post cholecystectomy. Electronically Signed   By: Lahoma Crocker M.D.   On: 10/03/2016 08:40    Scheduled Meds: . sodium chloride   Intravenous Once  . folic acid  1 mg Oral Daily  . HYDROmorphone   Intravenous Q4H  . loratadine  10 mg Oral Daily  . morphine  15 mg Oral Q12H  . multivitamin with minerals  1 tablet Oral Daily  . potassium chloride  40 mEq Oral BID  . senna-docusate  1 tablet Oral BID  . Vitamin D (Ergocalciferol)  50,000 Units Oral Q7 days   Continuous  Infusions: . dextrose 5 % and 0.45% NaCl 75 mL/hr at 10/09/16 0111    Principal Problem:   Sickle cell pain crisis [facility]) Active Problems:   Chest pain   Sickle cell crisis ([facility])     In excess of 25 minutes spent during this consult. Greater than 50% involved face to face contact with the patient for assessment, counseling and coordination of care.

## 2016-10-10 LAB — BASIC METABOLIC PANEL
Anion gap: 6 (ref 5–15)
CALCIUM: 8.4 mg/dL — AB (ref 8.9–10.3)
CHLORIDE: 107 mmol/L (ref 101–111)
CO2: 26 mmol/L (ref 22–32)
CREATININE: 0.32 mg/dL — AB (ref 0.44–1.00)
GFR calc non Af Amer: >60 mL/min (ref >60)
Glucose, Bld: 90 mg/dL (ref 65–99)
Potassium: 3.4 mmol/L — ABNORMAL LOW (ref 3.5–5.1)
SODIUM: 139 mmol/L (ref 135–145)

## 2016-10-10 LAB — CBC WITH DIFFERENTIAL/PLATELET
BASOS PCT: 0 %
Basophils Absolute: 0 10*3/uL (ref 0.0–0.1)
EOS PCT: 3 %
Eosinophils Absolute: 0.3 10*3/uL (ref 0.0–0.7)
HCT: 18.1 % — ABNORMAL LOW (ref 36.0–46.0)
HEMOGLOBIN: 6.6 g/dL — AB (ref 12.0–15.0)
Lymphocytes Relative: 20 %
Lymphs Abs: 2 10*3/uL (ref 0.7–4.0)
MCH: 33.8 pg (ref 26.0–34.0)
MCHC: 36.5 g/dL — ABNORMAL HIGH (ref 30.0–36.0)
MCV: 92.8 fL (ref 78.0–100.0)
MONO ABS: 1.8 10*3/uL — AB (ref 0.1–1.0)
Monocytes Relative: 18 %
NEUTROS ABS: 6.1 10*3/uL (ref 1.7–7.7)
NEUTROS PCT: 59 %
Platelets: 114 10*3/uL — ABNORMAL LOW (ref 150–400)
RBC: 1.95 MIL/uL — ABNORMAL LOW (ref 3.87–5.11)
RDW: 23.1 % — AB (ref 11.5–15.5)
WBC: 10.2 10*3/uL (ref 4.0–10.5)
nRBC: 16 /100 WBC — ABNORMAL HIGH

## 2016-10-10 LAB — RETICULOCYTES
RBC.: 1.95 MIL/uL — ABNORMAL LOW (ref 3.87–5.11)
Retic Ct Pct: >23 % — ABNORMAL HIGH (ref 0.4–3.1)

## 2016-10-10 LAB — MAGNESIUM: Magnesium: 1.8 mg/dL (ref 1.7–2.4)

## 2016-10-10 MED ORDER — HYDROXYUREA 500 MG PO CAPS
1000.0000 mg | ORAL_CAPSULE | Freq: Every day | ORAL | Status: DC
  Administered 2016-10-10 – 2016-10-17 (×8): 1000 mg via ORAL
  Filled 2016-10-10 (×8): qty 2

## 2016-10-10 MED ORDER — POTASSIUM CHLORIDE CRYS ER 20 MEQ PO TBCR
40.0000 meq | EXTENDED_RELEASE_TABLET | Freq: Once | ORAL | Status: AC
  Administered 2016-10-10: 18:00:00 40 meq via ORAL
  Filled 2016-10-10: qty 2

## 2016-10-10 NOTE — Progress Notes (Signed)
SICKLE CELL SERVICE PROGRESS NOTE  Dawn [name] [id number] DOB: Sep 21, 1981 DOA: 10/02/2016 PCP: Angelica Chessman, MD  Assessment/Plan: Principal Problem:   Sickle cell pain crisis ([name]) Active Problems:   Chest pain   Sickle cell crisis ([name])  1. Hb SS with crisis: Continue PCA at 0.7 mg bolus, clinician assisted dose and  Toradol. Pt eating and drinking well so will decrease IVF. To KVO. 2. Anemia of Chronic Disease: Hb improved to 6.3 g/dL today. Hydrea on hold and will continue to hold until labs reviewed tomorrow. However she has a very robust reticulocytosis so will continue Hydrea and check Hb tomorrow. If improved  3. Swelling Right Arm: Pt continues to have a difference in size of extremities from side to side with Left larger than right. However this does not appear to be acute and she has no swelling in either her wrist and hands, ot ankles and feet.  This is a chronic problem and she should have serial girth measurements over time to evaluate.   4. Chronic pain: Continue MS Contin   Code Status: Full Code Family Communication: N/A Disposition Plan: Not yet ready for discharge  [place].  Pager [phone]. If 7PM-7AM, please contact night-coverage.  10/10/2016, 3:10 PM  LOS: 7 days   Interim History: Pt rates her pain as 6/10 in sternum, 7/10 in gthe back, and 3/10 in the legs.  She has used 35.8 mg with 39/34:demands/deliveries on her PCA in the last 24 hours.  Consultants:  None  Procedures:  None  Antibiotics:  None   Objective: Vitals:   10/10/16 0524 10/10/16 0800 10/10/16 1354 10/10/16 1400  BP: 125/77  131/64   Pulse: 89  92   Resp: 19 20 20 14   Temp: 98.4 F (36.9 C)  98.7 F (37.1 C)   TempSrc: Oral  Oral   SpO2: 96% 95% 99% 95%  Weight:      Height:       Weight change:   Intake/Output Summary (Last 24 hours) at 10/10/16 1510 Last data filed at 10/10/16 0830  Gross per 24 hour  Intake              940 ml  Output                 0 ml  Net              940 ml    General: Alert, awake, oriented x3, in mild distress.  HEENT: [place]/AT PEERL, EOMI, moderate icterus increased over baseline.  Neck: Trachea midline,  no masses, no thyromegal,y no JVD, no carotid bruit Heart: Regular rate and rhythm, without murmurs, rubs, gallops, PMI non-displaced, no heaves or thrills on palpation.  Lungs: Clear to auscultation, no wheezing or rhonchi noted. No increased vocal fremitus resonant to percussion.   Abdomen:Obese, soft, nontender, nondistended, positive bowel sounds, no masses no hepatosplenomegaly noted.  Neuro: No focal neurological deficits noted cranial nerves II through XII grossly intact. Strength at functional baseline in bilateral upper and lower extremities. Musculoskeletal: No warmth swelling or erythema around joints, no spinal tenderness noted. However she does have swelling of the right arm.     Data Reviewed: Basic Metabolic Panel:  Recent Labs Lab 10/05/16 0530 10/10/16 0411  NA 137 139  K 3.5 3.4*  CL 108 107  CO2 24 26  GLUCOSE 114* 90  BUN 6 <5*  CREATININE 0.35* 0.32*  CALCIUM 7.9* 8.4*   Liver Function Tests: No results for input(s): AST,  ALT, ALKPHOS, BILITOT, PROT, ALBUMIN in the last 168 hours. No results for input(s): LIPASE, AMYLASE in the last 168 hours. No results for input(s): AMMONIA in the last 168 hours. CBC:  Recent Labs Lab 10/05/16 0530 10/07/16 0345 10/07/16 2054 10/09/16 1225 10/10/16 0411  WBC 11.1* 13.5* 10.8* 10.3 10.2  NEUTROABS 6.5 8.4* 5.7 6.0 6.1  HGB 6.0* 5.1* 6.5* 6.3* 6.6*  HCT 15.9* 13.7* 17.8* 16.8* 18.1*  MCV 95.2 94.5 91.8 92.8 92.8  PLT 120* 96* 113* 104* 114*   Cardiac Enzymes: No results for input(s): CKTOTAL, CKMB, CKMBINDEX, TROPONINI in the last 168 hours. BNP (last 3 results) No results for input(s): BNP in the last 8760 hours.  ProBNP (last 3 results) No results for input(s): PROBNP in the last 8760 hours.  CBG: No results for input(s):  GLUCAP in the last 168 hours.  No results found for this or any previous visit (from the past 240 hour(s)).   Studies: Dg Chest 2 View  Result Date: 10/02/2016 CLINICAL DATA:  Sickle-cell crisis.  Short of breath. EXAM: CHEST  2 VIEW COMPARISON:  09/30/2016 FINDINGS: Mild cardiac enlargement. Lungs are clear without infiltrate effusion or edema. Port-A-Cath tip in the lower SVC unchanged. No change from the prior study. IMPRESSION: No active cardiopulmonary disease. Electronically Signed   By: Franchot Gallo M.D.   On: 10/02/2016 19:09   Dg Chest 2 View  Result Date: 09/30/2016 CLINICAL DATA:  36 year old with current history of sickle cell disease, presenting with 2 week history of intermittent chest pain which acutely worsened today. EXAM: CHEST  2 VIEW COMPARISON:  08/03/2016, 04/28/2016 and earlier, including CTA chest 08/25/2015. FINDINGS: Cardiac silhouette upper normal in size to slightly enlarged, unchanged. Hilar and mediastinal contours otherwise unremarkable. Right jugular Port-A-Cath tip remains in the lower SVC, unchanged. Lungs clear. Bronchovascular markings normal. Pulmonary vascularity normal. No visible pleural effusions. No pneumothorax. Visualized bony thorax intact. IMPRESSION: Stable borderline heart size.  No acute cardiopulmonary disease. Electronically Signed   By: Evangeline Dakin M.D.   On: 09/30/2016 18:01   Ct Angio Chest Pe W Or Wo Contrast  Result Date: 10/03/2016 CLINICAL DATA:  Sickle cell anemia, worsening lower extremity and chest pain EXAM: CT ANGIOGRAPHY CHEST WITH CONTRAST TECHNIQUE: Multidetector CT imaging of the chest was performed using the standard protocol during bolus administration of intravenous contrast. Multiplanar CT image reconstructions and MIPs were obtained to evaluate the vascular anatomy. CONTRAST:  100 cc Isovue COMPARISON:  08/25/2015 FINDINGS: Cardiovascular: Borderline cardiomegaly. No pericardial effusion. There is right IJ Port-A-Cath with  tip in SVC right atrium junction. No pulmonary embolus No aortic aneurysm of aortic dissection. Mediastinum/Nodes: No mediastinal hematoma or adenopathy. Central airway is patent. Lungs/Pleura: Mild central venous congestion. Images of the lung parenchyma shows no infiltrate or pulmonary edema. No focal consolidation. Mild dependent atelectasis noted bilateral lower lobe posteriorly. There is no pneumothorax. No bronchiectasis. No emphysema. Upper Abdomen: The visualized upper abdomen shows status post cholecystectomy. No adrenal gland mass. There is nodular contour of the liver consistent with cirrhosis. Lobulated contour bilateral kidney. Musculoskeletal: Stable sclerotic bony changes within thoracic spine and sternum consistent with history of sickle cell anemia. No bone destruction or acute fracture. Stable sclerotic changes bilateral proximal humerus. Review of the MIP images confirms the above findings. IMPRESSION: 1. No pulmonary embolus is noted. 2. No mediastinal hematoma or adenopathy. 3. Borderline cardiomegaly.  No pericardial effusion. 4. Central mild venous congestion without pulmonary edema. No segmental consolidation. Mild dependent atelectasis bilateral lower  lobe posteriorly. No pneumothorax. 5. Stable chronic sclerotic bony changes. 6. Again noted nodular contour of the liver consistent with cirrhosis. Status post cholecystectomy. Electronically Signed   By: Lahoma Crocker M.D.   On: 10/03/2016 08:40    Scheduled Meds: . sodium chloride   Intravenous Once  . folic acid  1 mg Oral Daily  . HYDROmorphone   Intravenous Q4H  . hydroxyurea  1,000 mg Oral Daily  . loratadine  10 mg Oral Daily  . morphine  15 mg Oral Q12H  . multivitamin with minerals  1 tablet Oral Daily  . potassium chloride  40 mEq Oral BID  . senna-docusate  1 tablet Oral BID  . Vitamin D (Ergocalciferol)  50,000 Units Oral Q7 days   Continuous Infusions: . dextrose 5 % and 0.45% NaCl 10 mL/hr at 10/09/16 1649     Principal Problem:   Sickle cell pain crisis [facility]) Active Problems:   Chest pain   Sickle cell crisis (HCC)     In excess of 25 minutes spent during this consult. Greater than 50% involved face to face contact with the patient for assessment, counseling and coordination of care.

## 2016-10-10 NOTE — Progress Notes (Signed)
RN wasted 2 mg of PCA hydromorphone in sink, with Satira Anis, RN.

## 2016-10-11 ENCOUNTER — Inpatient Hospital Stay (HOSPITAL_COMMUNITY): Payer: Medicare Other

## 2016-10-11 DIAGNOSIS — M7989 Other specified soft tissue disorders: Secondary | ICD-10-CM

## 2016-10-11 LAB — CBC WITH DIFFERENTIAL/PLATELET
BASOS PCT: 0 %
Basophils Absolute: 0 10*3/uL (ref 0.0–0.1)
EOS PCT: 3 %
Eosinophils Absolute: 0.3 10*3/uL (ref 0.0–0.7)
HEMATOCRIT: 17.8 % — AB (ref 36.0–46.0)
HEMOGLOBIN: 6.7 g/dL — AB (ref 12.0–15.0)
LYMPHS ABS: 2.1 10*3/uL (ref 0.7–4.0)
Lymphocytes Relative: 23 %
MCH: 34.9 pg — AB (ref 26.0–34.0)
MCHC: 37.6 g/dL — AB (ref 30.0–36.0)
MCV: 92.7 fL (ref 78.0–100.0)
MONOS PCT: 19 %
Monocytes Absolute: 1.7 10*3/uL — ABNORMAL HIGH (ref 0.1–1.0)
NEUTROS ABS: 4.9 10*3/uL (ref 1.7–7.7)
Neutrophils Relative %: 55 %
Platelets: 120 10*3/uL — ABNORMAL LOW (ref 150–400)
RBC: 1.92 MIL/uL — AB (ref 3.87–5.11)
RDW: 21.4 % — ABNORMAL HIGH (ref 11.5–15.5)
WBC: 9 10*3/uL (ref 4.0–10.5)

## 2016-10-11 LAB — RETICULOCYTES
RBC.: 1.92 MIL/uL — AB (ref 3.87–5.11)
Retic Ct Pct: >23 % — ABNORMAL HIGH (ref 0.4–3.1)

## 2016-10-11 MED ORDER — OXYCODONE HCL 5 MG PO TABS
15.0000 mg | ORAL_TABLET | ORAL | Status: DC
Start: 2016-10-11 — End: 2016-10-17
  Administered 2016-10-11 – 2016-10-17 (×38): 15 mg via ORAL
  Filled 2016-10-11 (×38): qty 3

## 2016-10-11 MED ORDER — HYDROMORPHONE 1 MG/ML IV SOLN
INTRAVENOUS | Status: DC
  Administered 2016-10-11: 13.3 mg via INTRAVENOUS
  Administered 2016-10-11: 13:00:00 via INTRAVENOUS
  Administered 2016-10-12: 1.4 mg via INTRAVENOUS
  Administered 2016-10-12: 3.5 mg via INTRAVENOUS
  Administered 2016-10-12: 2.1 mg via INTRAVENOUS
  Administered 2016-10-12: 4.2 mg via INTRAVENOUS
  Administered 2016-10-12: 11:00:00 via INTRAVENOUS
  Administered 2016-10-13: 4.06 mg via INTRAVENOUS
  Administered 2016-10-13: 1.4 mg via INTRAVENOUS
  Administered 2016-10-13: 4.9 mg via INTRAVENOUS
  Administered 2016-10-13: 5.6 mg via INTRAVENOUS
  Administered 2016-10-13: 2.1 mg via INTRAVENOUS
  Administered 2016-10-13: 0 mg via INTRAVENOUS
  Administered 2016-10-13: 7.7 mg via INTRAVENOUS
  Administered 2016-10-14: 5.6 mg via INTRAVENOUS
  Administered 2016-10-14: 10.5 mg via INTRAVENOUS
  Administered 2016-10-14: 08:00:00 via INTRAVENOUS
  Filled 2016-10-11 (×4): qty 25

## 2016-10-11 NOTE — Progress Notes (Signed)
Wasted 3 cc of Dilaudid PCA in sink. Witnessed by Laverna Peace RN.

## 2016-10-11 NOTE — Progress Notes (Signed)
Wasted 0.5 cc of dilaudid PCA from old syringe with Chales Abrahams, RN. Wasted down sink.

## 2016-10-11 NOTE — Progress Notes (Signed)
Pt walked in the hallway the entire length of unit on room air and O2 sat at the lowest reading was 88%. Pt tolerated walk well.

## 2016-10-11 NOTE — Progress Notes (Signed)
Pt's O2 sat on room air 93-94%. Pt got up to BR and walked around her room and O2 sat dropped to 89% on room air.

## 2016-10-11 NOTE — Progress Notes (Signed)
SICKLE CELL SERVICE PROGRESS NOTE  Dawn [name] [id number] DOB: Apr 24, 1981 DOA: 10/02/2016 PCP: Angelica Chessman, MD  Assessment/Plan: Principal Problem:   Sickle cell pain crisis ([place]) Active Problems:   Chest pain   Sickle cell crisis ([place])  1. Hb SS with crisis: Schedule Oxycodone at 15 mg every 4 hours. Continue PCA at 0.7 mg bolus, discontinue clinician assisted dose. Pt eating and drinking well so will decrease IVF. To KVO. 2. Anemia of Chronic Disease: Hb improved to 6.3 g/dL today. Hydrea on hold and will continue to hold until labs reviewed tomorrow. However she has a very robust reticulocytosis so will continue Hydrea and check Hb tomorrow. If improved  3. Swelling Right Arm: Swelling worse in RUE today and with a reticular pattern on the arm. Discussed with Radiology. Will repeat Duplex today as recent CT shows no stenosis of central vessels. If duplex negative then will proceed with venogram.  4. Chronic pain: Continue MS Contin   Code Status: Full Code Family Communication: N/A Disposition Plan: Anticipate discharge home tomorrow.  Tuan Tippin A.  Pager [phone]. If 7PM-7AM, please contact night-coverage.  10/11/2016, 1:09 PM  LOS: 8 days   Interim History: Pt rates her pain as 6/10 in sternum, 7/10 in gthe back, and 3/10 in the legs.  She has used 41.6 mg with 48/48:demands/deliveries on her PCA in the last 24 hours.  Consultants:  None  Procedures:  None  Antibiotics:  None   Objective: Vitals:   10/11/16 0527 10/11/16 0733 10/11/16 0954 10/11/16 1200  BP:  126/80 119/70   Pulse:  88 92   Resp: 14 16 15 16   Temp:  98 F (36.7 C) 98.7 F (37.1 C)   TempSrc:  Axillary Oral   SpO2: 95% 96% 98% 94%  Weight:      Height:       Weight change:   Intake/Output Summary (Last 24 hours) at 10/11/16 1309 Last data filed at 10/11/16 1005  Gross per 24 hour  Intake           1582.7 ml  Output                0 ml  Net           1582.7 ml     General: Alert, awake, oriented x3, in mild distress.  HEENT: [place]/AT PEERL, EOMI, moderate icterus increased over baseline.  Neck: Trachea midline,  no masses, no thyromegal,y no JVD, no carotid bruit Heart: Regular rate and rhythm, without murmurs, rubs, gallops, PMI non-displaced, no heaves or thrills on palpation.  Lungs: Clear to auscultation, no wheezing or rhonchi noted. No increased vocal fremitus resonant to percussion.   Abdomen:Obese, soft, nontender, nondistended, positive bowel sounds, no masses no hepatosplenomegaly noted.  Neuro: No focal neurological deficits noted cranial nerves II through XII grossly intact. Strength at functional baseline in bilateral upper and lower extremities. Musculoskeletal: No warmth swelling or erythema around joints, no spinal tenderness noted. However she does have swelling of the right arm.     Data Reviewed: Basic Metabolic Panel:  Recent Labs Lab 10/05/16 0530 10/10/16 0402 10/10/16 0411  NA 137  --  139  K 3.5  --  3.4*  CL 108  --  107  CO2 24  --  26  GLUCOSE 114*  --  90  BUN 6  --  <5*  CREATININE 0.35*  --  0.32*  CALCIUM 7.9*  --  8.4*  MG  --  1.8  --  Liver Function Tests: No results for input(s): AST, ALT, ALKPHOS, BILITOT, PROT, ALBUMIN in the last 168 hours. No results for input(s): LIPASE, AMYLASE in the last 168 hours. No results for input(s): AMMONIA in the last 168 hours. CBC:  Recent Labs Lab 10/07/16 0345 10/07/16 2054 10/09/16 1225 10/10/16 0411 10/11/16 1000  WBC 13.5* 10.8* 10.3 10.2 9.0  NEUTROABS 8.4* 5.7 6.0 6.1 4.9  HGB 5.1* 6.5* 6.3* 6.6* 6.7*  HCT 13.7* 17.8* 16.8* 18.1* 17.8*  MCV 94.5 91.8 92.8 92.8 92.7  PLT 96* 113* 104* 114* 120*   Cardiac Enzymes: No results for input(s): CKTOTAL, CKMB, CKMBINDEX, TROPONINI in the last 168 hours. BNP (last 3 results) No results for input(s): BNP in the last 8760 hours.  ProBNP (last 3 results) No results for input(s): PROBNP in the last 8760  hours.  CBG: No results for input(s): GLUCAP in the last 168 hours.  No results found for this or any previous visit (from the past 240 hour(s)).   Studies: Dg Chest 2 View  Result Date: 10/02/2016 CLINICAL DATA:  Sickle-cell crisis.  Short of breath. EXAM: CHEST  2 VIEW COMPARISON:  09/30/2016 FINDINGS: Mild cardiac enlargement. Lungs are clear without infiltrate effusion or edema. Port-A-Cath tip in the lower SVC unchanged. No change from the prior study. IMPRESSION: No active cardiopulmonary disease. Electronically Signed   By: Franchot Gallo M.D.   On: 10/02/2016 19:09   Dg Chest 2 View  Result Date: 09/30/2016 CLINICAL DATA:  36 year old with current history of sickle cell disease, presenting with 2 week history of intermittent chest pain which acutely worsened today. EXAM: CHEST  2 VIEW COMPARISON:  08/03/2016, 04/28/2016 and earlier, including CTA chest 08/25/2015. FINDINGS: Cardiac silhouette upper normal in size to slightly enlarged, unchanged. Hilar and mediastinal contours otherwise unremarkable. Right jugular Port-A-Cath tip remains in the lower SVC, unchanged. Lungs clear. Bronchovascular markings normal. Pulmonary vascularity normal. No visible pleural effusions. No pneumothorax. Visualized bony thorax intact. IMPRESSION: Stable borderline heart size.  No acute cardiopulmonary disease. Electronically Signed   By: Evangeline Dakin M.D.   On: 09/30/2016 18:01   Ct Angio Chest Pe W Or Wo Contrast  Result Date: 10/03/2016 CLINICAL DATA:  Sickle cell anemia, worsening lower extremity and chest pain EXAM: CT ANGIOGRAPHY CHEST WITH CONTRAST TECHNIQUE: Multidetector CT imaging of the chest was performed using the standard protocol during bolus administration of intravenous contrast. Multiplanar CT image reconstructions and MIPs were obtained to evaluate the vascular anatomy. CONTRAST:  100 cc Isovue COMPARISON:  08/25/2015 FINDINGS: Cardiovascular: Borderline cardiomegaly. No pericardial  effusion. There is right IJ Port-A-Cath with tip in SVC right atrium junction. No pulmonary embolus No aortic aneurysm of aortic dissection. Mediastinum/Nodes: No mediastinal hematoma or adenopathy. Central airway is patent. Lungs/Pleura: Mild central venous congestion. Images of the lung parenchyma shows no infiltrate or pulmonary edema. No focal consolidation. Mild dependent atelectasis noted bilateral lower lobe posteriorly. There is no pneumothorax. No bronchiectasis. No emphysema. Upper Abdomen: The visualized upper abdomen shows status post cholecystectomy. No adrenal gland mass. There is nodular contour of the liver consistent with cirrhosis. Lobulated contour bilateral kidney. Musculoskeletal: Stable sclerotic bony changes within thoracic spine and sternum consistent with history of sickle cell anemia. No bone destruction or acute fracture. Stable sclerotic changes bilateral proximal humerus. Review of the MIP images confirms the above findings. IMPRESSION: 1. No pulmonary embolus is noted. 2. No mediastinal hematoma or adenopathy. 3. Borderline cardiomegaly.  No pericardial effusion. 4. Central mild venous congestion without pulmonary edema.  No segmental consolidation. Mild dependent atelectasis bilateral lower lobe posteriorly. No pneumothorax. 5. Stable chronic sclerotic bony changes. 6. Again noted nodular contour of the liver consistent with cirrhosis. Status post cholecystectomy. Electronically Signed   By: Lahoma Crocker M.D.   On: 10/03/2016 08:40    Scheduled Meds: . sodium chloride   Intravenous Once  . folic acid  1 mg Oral Daily  . HYDROmorphone   Intravenous Q4H  . hydroxyurea  1,000 mg Oral Daily  . loratadine  10 mg Oral Daily  . morphine  15 mg Oral Q12H  . multivitamin with minerals  1 tablet Oral Daily  . oxyCODONE  15 mg Oral Q4H  . potassium chloride  40 mEq Oral BID  . senna-docusate  1 tablet Oral BID  . Vitamin D (Ergocalciferol)  50,000 Units Oral Q7 days   Continuous  Infusions: . dextrose 5 % and 0.45% NaCl 10 mL/hr at 10/10/16 2303    Principal Problem:   Sickle cell pain crisis [facility]) Active Problems:   Chest pain   Sickle cell crisis (HCC)     In excess of 25 minutes spent during this consult. Greater than 50% involved face to face contact with the patient for assessment, counseling and coordination of care.

## 2016-10-11 NOTE — Progress Notes (Signed)
VASCULAR LAB PRELIMINARY  PRELIMINARY  PRELIMINARY  PRELIMINARY  Right upper extremity venous duplex completed.    Preliminary report:  Right :  No evidence of DVT or superficial thrombosis.    Dawn [name], [place], RVS 10/11/2016, 4:03 PM

## 2016-10-12 ENCOUNTER — Inpatient Hospital Stay (HOSPITAL_COMMUNITY): Payer: Medicare Other

## 2016-10-12 ENCOUNTER — Encounter (HOSPITAL_COMMUNITY): Payer: Self-pay | Admitting: Interventional Radiology

## 2016-10-12 HISTORY — PX: IR GENERIC HISTORICAL: IMG1180011

## 2016-10-12 MED ORDER — IOPAMIDOL (ISOVUE-300) INJECTION 61%
INTRAVENOUS | Status: AC
  Administered 2016-10-12: 10 mL via INTRAVENOUS
  Filled 2016-10-12: qty 50

## 2016-10-12 MED ORDER — MIDAZOLAM HCL 2 MG/2ML IJ SOLN
INTRAMUSCULAR | Status: AC
  Filled 2016-10-12: qty 4

## 2016-10-12 MED ORDER — HEPARIN SODIUM (PORCINE) 1000 UNIT/ML IJ SOLN
INTRAMUSCULAR | Status: AC
  Filled 2016-10-12: qty 1

## 2016-10-12 MED ORDER — LIDOCAINE HCL 1 % IJ SOLN
INTRAMUSCULAR | Status: AC
  Filled 2016-10-12: qty 20

## 2016-10-12 MED ORDER — IOPAMIDOL (ISOVUE-300) INJECTION 61%
50.0000 mL | Freq: Once | INTRAVENOUS | Status: AC | PRN
  Administered 2016-10-12: 09:00:00 20 mL via INTRAVENOUS

## 2016-10-12 MED ORDER — FLUMAZENIL 0.5 MG/5ML IV SOLN
INTRAVENOUS | Status: AC
  Filled 2016-10-12: qty 5

## 2016-10-12 MED ORDER — FENTANYL CITRATE (PF) 100 MCG/2ML IJ SOLN
INTRAMUSCULAR | Status: AC | PRN
  Administered 2016-10-12 (×4): 50 ug via INTRAVENOUS

## 2016-10-12 MED ORDER — MIDAZOLAM HCL 2 MG/2ML IJ SOLN
INTRAMUSCULAR | Status: AC | PRN
  Administered 2016-10-12 (×4): 1 mg via INTRAVENOUS

## 2016-10-12 MED ORDER — HEPARIN SODIUM (PORCINE) 1000 UNIT/ML IJ SOLN
INTRAMUSCULAR | Status: AC | PRN
  Administered 2016-10-12: 3000 [IU] via INTRAVENOUS

## 2016-10-12 MED ORDER — NALOXONE HCL 0.4 MG/ML IJ SOLN
INTRAMUSCULAR | Status: AC
  Filled 2016-10-12: qty 1

## 2016-10-12 MED ORDER — FENTANYL CITRATE (PF) 100 MCG/2ML IJ SOLN
INTRAMUSCULAR | Status: AC
  Filled 2016-10-12: qty 4

## 2016-10-12 MED ORDER — IOPAMIDOL (ISOVUE-300) INJECTION 61%
INTRAVENOUS | Status: AC
  Filled 2016-10-12: qty 50

## 2016-10-12 MED ORDER — FENTANYL CITRATE (PF) 100 MCG/2ML IJ SOLN
INTRAMUSCULAR | Status: AC
  Filled 2016-10-12: qty 2

## 2016-10-12 NOTE — Progress Notes (Signed)
Referring Physician(s): [name],M  Supervising Physician: Arne Cleveland  Patient Status:  [facility] - In-pt  Chief Complaint:  Right arm swelling  Subjective: Familiar to IR service from prior right chest wall Port-A-Cath placement on 07/17/16 as well as past PICC lines. She was recently admitted in sickle cell crisis and has also noted some right upper extremity swelling. Right upper extremity venous duplex yesterday revealed no evidence of DVT. Prior CT angiography of the chest on 1/10 revealed no PE but some central mild venous congestion without pulmonary edema. 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 She presents now for SVC angioplasty. She currently denies fever, dyspnea, nausea vomiting or abnormal bleeding. She does have occasional headaches, intermittent chest pain, intermittent abdominal/back pain as well as lower extremity discomfort. Past Medical History:  Diagnosis Date  . Blood transfusion   . Demand ischemia ([place]) 02/06/2016  . HCAP (healthcare-associated pneumonia) 05/19/2013  . 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 Pulmonary hypertension 02/06/2016   49 mmHg per echo on 02/06/16   . 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 Sickle cell anemia (HCC)   . Sickle cell disease ([place])   . 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 Vitamin B12 deficiency 07/07/2015   Past Surgical History:  Procedure Laterality Date  .  left knee ACL reconstruction    . CESAREAN SECTION     x 2  . CHOLECYSTECTOMY    . IR GENERIC HISTORICAL  07/17/2016   IR FLUORO GUIDE PORT INSERTION RIGHT 07/17/2016 Aletta Edouard, MD [facility]-INTERV RAD  . IR GENERIC HISTORICAL  07/17/2016   IR US GUIDE VASC ACCESS RIGHT 07/17/2016 Aletta Edouard, MD [facility]-INTERV RAD  . PORT-A-CATH REMOVAL Left 07/29/2013   Procedure: REMOVAL PORT-A-CATH;  Surgeon: Scherry Ran, MD;  Location: [facility] ORS;  Service: General;  Laterality: Left;    . PORTACATH PLACEMENT    . PORTACATH PLACEMENT Left 02/23/2013   Procedure: INSERTION PORT-A-CATH;  Surgeon: Donato Heinz, MD;  Location: [facility] ORS;  Service: General;  Laterality: Left;  . TEE WITHOUT CARDIOVERSION N/A 08/07/2013   Procedure: TRANSESOPHAGEAL ECHOCARDIOGRAM (TEE);  Surgeon: Arnoldo Lenis, MD;  Location: [facility] ENDO SUITE;  Service: Cardiology;  Laterality: N/A;      Allergies: Desferal [deferoxamine]; Latex; Lisinopril; Tape; and Tylenol [acetaminophen]  Medications: Prior to Admission medications   Medication Sig Start Date End Date Taking? Authorizing Provider  folic acid (FOLVITE) 1 MG tablet Take 1 tablet (1 mg total) by mouth daily. 03/14/16  Yes [name], FNP  hydroxyurea (HYDREA) 500 MG capsule Take 2 capsules (1,000 mg total) by mouth daily. May take with food to minimize GI side effects. 03/14/16  Yes [name], FNP  loratadine (CLARITIN) 10 MG tablet Take 10 mg by mouth daily.   Yes Historical Provider, MD  morphine (MS CONTIN) 15 MG 12 hr tablet Take 15 mg by mouth every 12 (twelve) hours.   Yes Historical Provider, MD  Multiple Vitamin (MULTIVITAMIN WITH MINERALS) TABS tablet Take 1 tablet by mouth daily.   Yes Historical Provider, MD  Oxycodone HCl 10 MG TABS Take 1 tablet (10 mg total) by mouth every 6 (six) hours as needed. 09/25/16  Yes Kinnie Feil, PA-C  promethazine (PHENERGAN) 12.5 MG tablet Take 1 tablet (12.5 mg total) by mouth every 6 (six) hours as needed for nausea or vomiting. 09/25/16  Yes Kinnie Feil, PA-C  Vitamin D, Ergocalciferol, (DRISDOL) 50000  units CAPS capsule Take 1 capsule (50,000 Units total) by mouth every 7 (seven) days. Takes on Sundays. 06/01/16  Yes Micheline Chapman, NP  levonorgestrel (MIRENA) 20 MCG/24HR IUD 1 each by Intrauterine route once.    Historical Provider, MD     Vital Signs: BP 122/66 (BP Location: Right Arm)   Pulse 90   Temp 98.4 F (36.9 C) (Oral)   Resp 16   Ht 5\' 3"  (1.6 m)   Wt 161 lb  (73 kg)   SpO2 95%   BMI 28.52 kg/m   Physical Exam patient awake, alert. Chest clear to auscultation bilaterally. clean, intact right chest wall Port-A-Cath. Heart with tachycardic but regular rhythm. Abdomen soft, positive bowel sounds, nontender. Lower extremities with some trace edema. Moderate edema of right upper extremity.  Imaging: No results found.  Labs:  CBC:  Recent Labs  10/07/16 2054 10/09/16 1225 10/10/16 0411 10/11/16 1000  WBC 10.8* 10.3 10.2 9.0  HGB 6.5* 6.3* 6.6* 6.7*  HCT 17.8* 16.8* 18.1* 17.8*  PLT 113* 104* 114* 120*    COAGS:  Recent Labs  02/06/16 0529 07/17/16 1235  INR 1.49 1.23  APTT 38*  --     BMP:  Recent Labs  10/02/16 2018 10/03/16 0623 10/05/16 0530 10/10/16 0411  NA 136 136 137 139  K 3.1* 3.1* 3.5 3.4*  CL 105 106 108 107  CO2 25 25 24 26   GLUCOSE 109* 118* 114* 90  BUN 5* 7 6 <5*  CALCIUM 8.4* 8.1* 7.9* 8.4*  CREATININE 0.52 0.83 0.35* 0.32*  GFRNONAA >60 >60 >60 >60  GFRAA >60 >60 >60 >60    LIVER FUNCTION TESTS:  Recent Labs  09/28/16 1025 09/30/16 1805 10/02/16 2018 10/03/16 0623  BILITOT 15.4* 15.1* 15.4* 14.6*  AST 90* 86* 86* 83*  ALT 29 28 27 25   ALKPHOS 129* 125 113 107  PROT 7.6 7.9 7.6 7.3  ALBUMIN 2.8* 2.8* 2.7* 2.6*    Assessment and Plan: Patient recent admitted with sickle cell crisis, right upper extremity edema. 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 She presents now for SVC angioplasty. Details/risks of procedure, including but not limited to, internal bleeding, infection, contrast nephropathy, injury to adjacent structures, possible need for stent at later date discussed with patient with her understanding and consent.   Electronically Signed: D. Rowe Robert 10/12/2016, 9:52 AM   I spent a total of 20 minutes at the the patient's bedside AND on the patient's hospital floor or unit, greater than 50% of which was counseling/coordinating care for right upper  extremity venography with possible angioplasty    Patient ID: Dawn [name], female   DOB: 1981/07/27, 36 y.o.   MRN: [id number]

## 2016-10-12 NOTE — Progress Notes (Signed)
Wasted 3 cc of remainder dilaudid PCA syringe with Herbie Drape, RN.

## 2016-10-12 NOTE — Procedures (Signed)
RUE venogram R brachiocephalic vein PTA No complication No blood loss. See complete dictation in [facility].

## 2016-10-12 NOTE — Progress Notes (Addendum)
MEDICATION-RELATED CONSULT NOTE   IR Procedure Consult - Anticoagulant/Antiplatelet PTA/Inpatient Med List Review by Pharmacist    Procedure: RUE venogram, R brachiocephalic vein PTA    Completed: 10/12/2016 at 46  Post-Procedural bleeding risk per IR MD assessment:  Standard  Antithrombotic medications on inpatient or PTA profile prior to procedure:   None    Recommended restart time per IR Post-Procedure Guidelines:  N/A   Other considerations: Hgb 6.7, Platelet count also low at 120K   Plan:      Per IR recommendations as per Dr. Adron Bene note: Consider continued low level anticoagulation to prevent recurrence of stenosis.  Would defer to attending physician on need for anticoagulation.   Lindell Spar, PharmD, BCPS Pager: [phone] 10/12/2016 7:41 PM

## 2016-10-12 NOTE — Care Management Important Message (Signed)
Important Message  Patient Details  Name: Dawn [name] MRN: [id number] Date of Birth: 1980/11/28   Medicare Important Message Given:  Yes    Kerin Salen 10/12/2016, 2:51 [place] Message  Patient Details  Name: Dawn [name] MRN: [id number] Date of Birth: [birth year]   Medicare Important Message Given:  Yes    Kerin Salen 10/12/2016, 2:51 PM

## 2016-10-12 NOTE — Progress Notes (Signed)
Nutrition Brief Note  Patient identified to be seen for length of stay.   Wt Readings from Last 15 Encounters:  10/02/16 161 lb (73 kg)  09/30/16 161 lb 1.6 oz (73.1 kg)  09/28/16 171 lb (77.6 kg)  09/25/16 171 lb (77.6 kg)  09/18/16 171 lb (77.6 kg)  09/07/16 172 lb (78 kg)  09/07/16 172 lb (78 kg)  09/05/16 167 lb (75.8 kg)  08/22/16 167 lb (75.8 kg)  08/02/16 193 lb 2 oz (87.6 kg)  07/26/16 169 lb (76.7 kg)  07/20/16 169 lb (76.7 kg)  07/17/16 169 lb (76.7 kg)  07/06/16 169 lb (76.7 kg)  07/03/16 169 lb 1.6 oz (76.7 kg)   Spoke with patient at bedside. She reports that during this admission she has only been eating 2 meals per day. She reports her port had not been working correctly and fluid had been going throughout her body, leading to edema. Noted deep pitting edema on exam. She reports weight loss of 10 lbs in chart is related to fluid loss. Otherwise patient with no wasting of fat or muscle on exam. Denies N/V, abdominal pain, or constipation/diarrhea. Patient requesting double portions of main menu item or ability to order two (spaghetti + grilled cheese) - left note in [facility].   Body mass index is 28.52 kg/m. Patient meets criteria for Overweight based on current BMI.   Current diet order is Regular, patient is consuming approximately 75-100% of meals at this time. Labs and medications reviewed.   No nutrition interventions warranted at this time. If nutrition issues arise, please consult RD.   Willey Blade, MS, RD, LDN Pager: [phone] After Hours Pager: [phone]

## 2016-10-12 NOTE — Progress Notes (Signed)
SICKLE CELL SERVICE PROGRESS NOTE  Dawn [name] [id number] DOB: [birth year] DOA: 10/02/2016 PCP: Angelica Chessman, MD  Assessment/Plan: Principal Problem:   Sickle cell pain crisis ([place]) Active Problems:   Chest pain   Sickle cell crisis ([place])  1. Hb SS with crisis: Slowly improving. Continue PCA bolus, clinician assisted dose and  Toradol.  2. Anemia of Chronic Disease: Hb is stable. Continue to monitor. 3. Swelling Right Arm: Pt continues to have a difference in size of extremities from side to side with Left larger than right. However this does not appear to be acute and she has no swelling in either her wrist and hands, ot ankles and feet.  This is a chronic problem and she should have serial girth measurements over time to evaluate.   4. Chronic pain: Continue MS Contin   Code Status: Full Code Family Communication: N/A Disposition Plan: Not yet ready for discharge  [facility]  Pager [phone]. If 7PM-7AM, please contact night-coverage.  10/12/2016, 7:35 PM  LOS: 9 days   Interim History: Pt rates her pain as 6/10 in sternum, 7/10 in gthe back, and 3/10 in the legs.  She has used 33 mg with 37/36: demands/deliveries on her PCA in the last 24 hours.  Consultants:  None  Procedures:  None  Antibiotics:  None   Objective: Vitals:   10/12/16 1051 10/12/16 1200 10/12/16 1431 10/12/16 1600  BP:  (!) 158/84 104/82   Pulse:  92 92   Resp: (!) 21 (!) 22 20 16   Temp:  99.1 F (37.3 C) 97.6 F (36.4 C)   TempSrc:  Oral Oral   SpO2: 93% 99% 99% 94%  Weight:      Height:       Weight change:   Intake/Output Summary (Last 24 hours) at 10/12/16 1935 Last data filed at 10/12/16 1400  Gross per 24 hour  Intake             1040 ml  Output                0 ml  Net             1040 ml    General: Alert, awake, oriented x3, in mild distress.  HEENT: [place]/AT PEERL, EOMI, moderate icterus increased over baseline.  Neck: Trachea midline,  no masses, no thyromegal,y no  JVD, no carotid bruit Heart: Regular rate and rhythm, without murmurs, rubs, gallops, PMI non-displaced, no heaves or thrills on palpation.  Lungs: Clear to auscultation, no wheezing or rhonchi noted. No increased vocal fremitus resonant to percussion.   Abdomen:Obese, soft, nontender, nondistended, positive bowel sounds, no masses no hepatosplenomegaly noted.  Neuro: No focal neurological deficits noted cranial nerves II through XII grossly intact. Strength at functional baseline in bilateral upper and lower extremities. Musculoskeletal: No warmth swelling or erythema around joints, no spinal tenderness noted. However she does have swelling of the right arm.     Data Reviewed: Basic Metabolic Panel:  Recent Labs Lab 10/10/16 0402 10/10/16 0411  NA  --  139  K  --  3.4*  CL  --  107  CO2  --  26  GLUCOSE  --  90  BUN  --  <5*  CREATININE  --  0.32*  CALCIUM  --  8.4*  MG 1.8  --    Liver Function Tests: No results for input(s): AST, ALT, ALKPHOS, BILITOT, PROT, ALBUMIN in the last 168 hours. No results for input(s): LIPASE, AMYLASE in the  last 168 hours. No results for input(s): AMMONIA in the last 168 hours. CBC:  Recent Labs Lab 10/07/16 0345 10/07/16 2054 10/09/16 1225 10/10/16 0411 10/11/16 1000  WBC 13.5* 10.8* 10.3 10.2 9.0  NEUTROABS 8.4* 5.7 6.0 6.1 4.9  HGB 5.1* 6.5* 6.3* 6.6* 6.7*  HCT 13.7* 17.8* 16.8* 18.1* 17.8*  MCV 94.5 91.8 92.8 92.8 92.7  PLT 96* 113* 104* 114* 120*   Cardiac Enzymes: No results for input(s): CKTOTAL, CKMB, CKMBINDEX, TROPONINI in the last 168 hours. BNP (last 3 results) No results for input(s): BNP in the last 8760 hours.  ProBNP (last 3 results) No results for input(s): PROBNP in the last 8760 hours.  CBG: No results for input(s): GLUCAP in the last 168 hours.  No results found for this or any previous visit (from the past 240 hour(s)).   Studies: Dg Chest 2 View  Result Date: 10/02/2016 CLINICAL DATA:  Sickle-cell  crisis.  Short of breath. EXAM: CHEST  2 VIEW COMPARISON:  09/30/2016 FINDINGS: Mild cardiac enlargement. Lungs are clear without infiltrate effusion or edema. Port-A-Cath tip in the lower SVC unchanged. No change from the prior study. IMPRESSION: No active cardiopulmonary disease. Electronically Signed   By: Franchot Gallo M.D.   On: 10/02/2016 19:09   Dg Chest 2 View  Result Date: 09/30/2016 CLINICAL DATA:  36 year old with current history of sickle cell disease, presenting with 2 week history of intermittent chest pain which acutely worsened today. EXAM: CHEST  2 VIEW COMPARISON:  08/03/2016, 04/28/2016 and earlier, including CTA chest 08/25/2015. FINDINGS: Cardiac silhouette upper normal in size to slightly enlarged, unchanged. Hilar and mediastinal contours otherwise unremarkable. Right jugular Port-A-Cath tip remains in the lower SVC, unchanged. Lungs clear. Bronchovascular markings normal. Pulmonary vascularity normal. No visible pleural effusions. No pneumothorax. Visualized bony thorax intact. IMPRESSION: Stable borderline heart size.  No acute cardiopulmonary disease. Electronically Signed   By: Evangeline Dakin M.D.   On: 09/30/2016 18:01   Ct Angio Chest Pe W Or Wo Contrast  Result Date: 10/03/2016 CLINICAL DATA:  Sickle cell anemia, worsening lower extremity and chest pain EXAM: CT ANGIOGRAPHY CHEST WITH CONTRAST TECHNIQUE: Multidetector CT imaging of the chest was performed using the standard protocol during bolus administration of intravenous contrast. Multiplanar CT image reconstructions and MIPs were obtained to evaluate the vascular anatomy. CONTRAST:  100 cc Isovue COMPARISON:  08/25/2015 FINDINGS: Cardiovascular: Borderline cardiomegaly. No pericardial effusion. There is right IJ Port-A-Cath with tip in SVC right atrium junction. No pulmonary embolus No aortic aneurysm of aortic dissection. Mediastinum/Nodes: No mediastinal hematoma or adenopathy. Central airway is patent. Lungs/Pleura:  Mild central venous congestion. Images of the lung parenchyma shows no infiltrate or pulmonary edema. No focal consolidation. Mild dependent atelectasis noted bilateral lower lobe posteriorly. There is no pneumothorax. No bronchiectasis. No emphysema. Upper Abdomen: The visualized upper abdomen shows status post cholecystectomy. No adrenal gland mass. There is nodular contour of the liver consistent with cirrhosis. Lobulated contour bilateral kidney. Musculoskeletal: Stable sclerotic bony changes within thoracic spine and sternum consistent with history of sickle cell anemia. No bone destruction or acute fracture. Stable sclerotic changes bilateral proximal humerus. Review of the MIP images confirms the above findings. IMPRESSION: 1. No pulmonary embolus is noted. 2. No mediastinal hematoma or adenopathy. 3. Borderline cardiomegaly.  No pericardial effusion. 4. Central mild venous congestion without pulmonary edema. No segmental consolidation. Mild dependent atelectasis bilateral lower lobe posteriorly. No pneumothorax. 5. Stable chronic sclerotic bony changes. 6. Again noted nodular contour of the liver  consistent with cirrhosis. Status post cholecystectomy. Electronically Signed   By: Lahoma Crocker M.D.   On: 10/03/2016 08:40   Ir Veno/ext/uni Right  Result Date: 10/12/2016 CLINICAL DATA:  Sickle cell disease. Right IJ port catheter placement 07/17/2016 due to poor peripheral venous access and recurrent venous access needs. Right arm swelling. Negative Doppler. EXAM: RIGHT EXTREMITY VENOGRAPHY; IR PTA VENOUS EXCEPT DIALYSIS CIRCUIT; IR ULTRASOUND GUIDANCE VASC ACCESS RIGHT PROCEDURE: The procedure, risks (including but not limited to bleeding, infection, organ damage ), benefits, and alternatives were explained to the patient. Questions regarding the procedure were encouraged and answered. The patient understands and consents to the procedure. Right upper arm prepped with chlorhexidine, draped in usual sterile  fashion. Under real-time ultrasound guidance, the right brachial vein was accessed with a micropuncture 21 gauge Set for right upper extremity venography. Ultrasound documentation was saved. Right venous access and surrounding brachial region prepped and draped in usual sterile fashion. Maximal barrier sterile technique was utilized including caps, mask, sterile gowns, sterile gloves, sterile drape, hand hygiene and skin antiseptic. The right venous access was exchanged over a Bentson wire for a 7 [place] vascular sheath, through which an angled 5 [place] angiographic catheter was advanced and negotiated across the right brachiocephalic venous occlusion using an angled Glidewire. 3000 units heparin given IV. The catheter was exchanged over an Amplatz wire for a a 12 mm x 4 cm atlas angioplasty balloon, used to dilate the is obstruction. After final venogram, the catheter was removed and hemostasis achieved at the site. The patient tolerated the procedure well. ANESTHESIA/SEDATION: Intravenous Fentanyl and Versed were administered as conscious sedation during continuous cardiorespiratory monitoring by the radiology RN, with a total moderate sedation time of 16 minutes. MEDICATIONS: Lidocaine 1% subcutaneous CONTRAST:  1 ISOVUE-300 IOPAMIDOL (ISOVUE-300) INJECTION 61%, 1 ISOVUE-300 IOPAMIDOL (ISOVUE-300) INJECTION [id number] COMPLICATIONS: None immediate FINDINGS: Right upper extremity venography demonstrates patency of right axillary and subclavian veins. There is a high-grade stenosis in the right brachiocephalic vein with right upper extremity drainage via thyrocervical and body wall collaterals. The collateral flow ultimately opacifies the left innominate vein, azygos arch, and SVC, shown to be patent to the right atrium. The right brachiocephalic vein stenosis responded well to the 12 mm balloon angioplasty. Follow-up venogram shows no significant residual or recurrent stenosis. No extravasation, dissection, or other  apparent complication. No further filling of collateral channels. IMPRESSION: 1. High-grade right brachiocephalic vein stenosis, which likely accounted for the patient's right upper extremity swelling. 2. Good response of the lesion to 12 mm balloon angioplasty. Consider continued low level anticoagulation to prevent recurrence. Electronically Signed   By: Lucrezia Europe M.D.   On: 10/12/2016 11:18   Ir US Guide Vasc Access Right  Result Date: 10/12/2016 CLINICAL DATA:  Sickle cell disease. Right IJ port catheter placement 07/17/2016 due to poor peripheral venous access and recurrent venous access needs. Right arm swelling. Negative Doppler. EXAM: RIGHT EXTREMITY VENOGRAPHY; IR PTA VENOUS EXCEPT DIALYSIS CIRCUIT; IR ULTRASOUND GUIDANCE VASC ACCESS RIGHT PROCEDURE: The procedure, risks (including but not limited to bleeding, infection, organ damage ), benefits, and alternatives were explained to the patient. Questions regarding the procedure were encouraged and answered. The patient understands and consents to the procedure. Right upper arm prepped with chlorhexidine, draped in usual sterile fashion. Under real-time ultrasound guidance, the right brachial vein was accessed with a micropuncture 21 gauge Set for right upper extremity venography. Ultrasound documentation was saved. Right venous access and surrounding brachial region prepped and draped in usual  sterile fashion. Maximal barrier sterile technique was utilized including caps, mask, sterile gowns, sterile gloves, sterile drape, hand hygiene and skin antiseptic. The right venous access was exchanged over a Bentson wire for a 7 [place] vascular sheath, through which an angled 5 [place] angiographic catheter was advanced and negotiated across the right brachiocephalic venous occlusion using an angled Glidewire. 3000 units heparin given IV. The catheter was exchanged over an Amplatz wire for a a 12 mm x 4 cm atlas angioplasty balloon, used to dilate the is  obstruction. After final venogram, the catheter was removed and hemostasis achieved at the site. The patient tolerated the procedure well. ANESTHESIA/SEDATION: Intravenous Fentanyl and Versed were administered as conscious sedation during continuous cardiorespiratory monitoring by the radiology RN, with a total moderate sedation time of 16 minutes. MEDICATIONS: Lidocaine 1% subcutaneous CONTRAST:  1 ISOVUE-300 IOPAMIDOL (ISOVUE-300) INJECTION 61%, 1 ISOVUE-300 IOPAMIDOL (ISOVUE-300) INJECTION [id number] COMPLICATIONS: None immediate FINDINGS: Right upper extremity venography demonstrates patency of right axillary and subclavian veins. There is a high-grade stenosis in the right brachiocephalic vein with right upper extremity drainage via thyrocervical and body wall collaterals. The collateral flow ultimately opacifies the left innominate vein, azygos arch, and SVC, shown to be patent to the right atrium. The right brachiocephalic vein stenosis responded well to the 12 mm balloon angioplasty. Follow-up venogram shows no significant residual or recurrent stenosis. No extravasation, dissection, or other apparent complication. No further filling of collateral channels. IMPRESSION: 1. High-grade right brachiocephalic vein stenosis, which likely accounted for the patient's right upper extremity swelling. 2. Good response of the lesion to 12 mm balloon angioplasty. Consider continued low level anticoagulation to prevent recurrence. Electronically Signed   By: Lucrezia Europe M.D.   On: 10/12/2016 11:18   Ir Pta Venous Except Dialysis Circuit  Result Date: 10/12/2016 CLINICAL DATA:  Sickle cell disease. Right IJ port catheter placement 07/17/2016 due to poor peripheral venous access and recurrent venous access needs. Right arm swelling. Negative Doppler. EXAM: RIGHT EXTREMITY VENOGRAPHY; IR PTA VENOUS EXCEPT DIALYSIS CIRCUIT; IR ULTRASOUND GUIDANCE VASC ACCESS RIGHT PROCEDURE: The procedure, risks (including but not limited to  bleeding, infection, organ damage ), benefits, and alternatives were explained to the patient. Questions regarding the procedure were encouraged and answered. The patient understands and consents to the procedure. Right upper arm prepped with chlorhexidine, draped in usual sterile fashion. Under real-time ultrasound guidance, the right brachial vein was accessed with a micropuncture 21 gauge Set for right upper extremity venography. Ultrasound documentation was saved. Right venous access and surrounding brachial region prepped and draped in usual sterile fashion. Maximal barrier sterile technique was utilized including caps, mask, sterile gowns, sterile gloves, sterile drape, hand hygiene and skin antiseptic. The right venous access was exchanged over a Bentson wire for a 7 [place] vascular sheath, through which an angled 5 [place] angiographic catheter was advanced and negotiated across the right brachiocephalic venous occlusion using an angled Glidewire. 3000 units heparin given IV. The catheter was exchanged over an Amplatz wire for a a 12 mm x 4 cm atlas angioplasty balloon, used to dilate the is obstruction. After final venogram, the catheter was removed and hemostasis achieved at the site. The patient tolerated the procedure well. ANESTHESIA/SEDATION: Intravenous Fentanyl and Versed were administered as conscious sedation during continuous cardiorespiratory monitoring by the radiology RN, with a total moderate sedation time of 16 minutes. MEDICATIONS: Lidocaine 1% subcutaneous CONTRAST:  1 ISOVUE-300 IOPAMIDOL (ISOVUE-300) INJECTION 61%, 1 ISOVUE-300 IOPAMIDOL (ISOVUE-300) INJECTION [id number] COMPLICATIONS: None immediate FINDINGS: Right  upper extremity venography demonstrates patency of right axillary and subclavian veins. There is a high-grade stenosis in the right brachiocephalic vein with right upper extremity drainage via thyrocervical and body wall collaterals. The collateral flow ultimately opacifies the left  innominate vein, azygos arch, and SVC, shown to be patent to the right atrium. The right brachiocephalic vein stenosis responded well to the 12 mm balloon angioplasty. Follow-up venogram shows no significant residual or recurrent stenosis. No extravasation, dissection, or other apparent complication. No further filling of collateral channels. IMPRESSION: 1. High-grade right brachiocephalic vein stenosis, which likely accounted for the patient's right upper extremity swelling. 2. Good response of the lesion to 12 mm balloon angioplasty. Consider continued low level anticoagulation to prevent recurrence. Electronically Signed   By: Lucrezia Europe M.D.   On: 10/12/2016 11:18    Scheduled Meds: . sodium chloride   Intravenous Once  . fentaNYL      . folic acid  1 mg Oral Daily  . heparin      . HYDROmorphone   Intravenous Q4H  . hydroxyurea  1,000 mg Oral Daily  . iopamidol      . lidocaine      . loratadine  10 mg Oral Daily  . midazolam      . morphine  15 mg Oral Q12H  . multivitamin with minerals  1 tablet Oral Daily  . oxyCODONE  15 mg Oral Q4H  . potassium chloride  40 mEq Oral BID  . senna-docusate  1 tablet Oral BID  . Vitamin D (Ergocalciferol)  50,000 Units Oral Q7 days   Continuous Infusions: . dextrose 5 % and 0.45% NaCl 10 mL/hr at 10/10/16 2303    Principal Problem:   Sickle cell pain crisis [facility]) Active Problems:   Chest pain   Sickle cell crisis ([facility])     In excess of 25 minutes spent during this consult. Greater than 50% involved face to face contact with the patient for assessment, counseling and coordination of care.

## 2016-10-13 DIAGNOSIS — I871 Compression of vein: Secondary | ICD-10-CM | POA: Diagnosis present

## 2016-10-13 LAB — CBC WITH DIFFERENTIAL/PLATELET
Basophils Absolute: 0 10*3/uL (ref 0.0–0.1)
Basophils Relative: 0 %
Eosinophils Absolute: 0.3 10*3/uL (ref 0.0–0.7)
Eosinophils Relative: 3 %
HEMATOCRIT: 18.5 % — AB (ref 36.0–46.0)
Hemoglobin: 6.7 g/dL — CL (ref 12.0–15.0)
LYMPHS ABS: 2.7 10*3/uL (ref 0.7–4.0)
Lymphocytes Relative: 26 %
MCH: 33.8 pg (ref 26.0–34.0)
MCHC: 36.2 g/dL — AB (ref 30.0–36.0)
MCV: 93.4 fL (ref 78.0–100.0)
MONOS PCT: 18 %
Monocytes Absolute: 1.8 10*3/uL — ABNORMAL HIGH (ref 0.1–1.0)
NEUTROS ABS: 5.3 10*3/uL (ref 1.7–7.7)
NEUTROS PCT: 53 %
Platelets: 134 10*3/uL — ABNORMAL LOW (ref 150–400)
RBC: 1.98 MIL/uL — ABNORMAL LOW (ref 3.87–5.11)
RDW: 20.4 % — ABNORMAL HIGH (ref 11.5–15.5)
WBC: 10.1 10*3/uL (ref 4.0–10.5)

## 2016-10-13 LAB — COMPREHENSIVE METABOLIC PANEL
ALK PHOS: 108 U/L (ref 38–126)
ALT: 26 U/L (ref 14–54)
ANION GAP: 5 (ref 5–15)
AST: 93 U/L — ABNORMAL HIGH (ref 15–41)
Albumin: 2.1 g/dL — ABNORMAL LOW (ref 3.5–5.0)
BILIRUBIN TOTAL: 12.5 mg/dL — AB (ref 0.3–1.2)
BUN: 5 mg/dL — ABNORMAL LOW (ref 6–20)
CALCIUM: 7.9 mg/dL — AB (ref 8.9–10.3)
CO2: 26 mmol/L (ref 22–32)
Chloride: 106 mmol/L (ref 101–111)
Creatinine, Ser: 0.45 mg/dL (ref 0.44–1.00)
GFR calc non Af Amer: >60 mL/min (ref >60)
GLUCOSE: 120 mg/dL — AB (ref 65–99)
Potassium: 3.3 mmol/L — ABNORMAL LOW (ref 3.5–5.1)
Sodium: 137 mmol/L (ref 135–145)
TOTAL PROTEIN: 6.5 g/dL (ref 6.5–8.1)

## 2016-10-13 NOTE — Progress Notes (Signed)
SICKLE CELL SERVICE PROGRESS NOTE  Dawn [name] [id number] DOB: 1981/03/21 DOA: 10/02/2016 PCP: Angelica Chessman, MD  Assessment/Plan: Principal Problem:   Sickle cell pain crisis ([place]) Active Problems:   Chest pain   Sickle cell crisis ([place])  1. Hb SS with crisis: Slowly improving. Continue PCA bolus, clinician assisted dose and  Toradol.  2. Anemia of Chronic Disease: Hb is stable. Continue to monitor. 3. Swelling Right Arm: Pt continues to have a difference in size of extremities from side to side with Left larger than right. However this does not appear to be acute and she has no swelling in either her wrist and hands, ot ankles and feet.  This is a chronic problem and she should have serial girth measurements over time to evaluate.   4. Chronic pain: Continue MS Contin   Code Status: Full Code Family Communication: N/A Disposition Plan: Not yet ready for discharge  [facility]  Pager [phone]. If 7PM-7AM, please contact night-coverage.  10/13/2016, 9:41 PM  LOS: 10 days   Interim History: Pt rates her pain as 6/10 in sternum, 7/10 in gthe back, and 3/10 in the legs.  She has used 33 mg with 37/36: demands/deliveries on her PCA in the last 24 hours.  Consultants:  None  Procedures:  None  Antibiotics:  None   Objective: Vitals:   10/13/16 1400 10/13/16 1811 10/13/16 1952 10/13/16 2104  BP: (!) 145/86   (!) 141/74  Pulse: 94 96  100  Resp: 16 15 17 16   Temp: 99.1 F (37.3 C) 99.2 F (37.3 C)  98.9 F (37.2 C)  TempSrc: Oral Oral    SpO2: 98% 99% 95% 97%  Weight:      Height:       Weight change:   Intake/Output Summary (Last 24 hours) at 10/13/16 2141 Last data filed at 10/13/16 1811  Gross per 24 hour  Intake             1790 ml  Output                0 ml  Net             1790 ml    General: Alert, awake, oriented x3, in mild distress.  HEENT: Early/AT PEERL, EOMI, moderate icterus increased over baseline.  Neck: Trachea midline,  no masses, no  thyromegal,y no JVD, no carotid bruit Heart: Regular rate and rhythm, without murmurs, rubs, gallops, PMI non-displaced, no heaves or thrills on palpation.  Lungs: Clear to auscultation, no wheezing or rhonchi noted. No increased vocal fremitus resonant to percussion.   Abdomen:Obese, soft, nontender, nondistended, positive bowel sounds, no masses no hepatosplenomegaly noted.  Neuro: No focal neurological deficits noted cranial nerves II through XII grossly intact. Strength at functional baseline in bilateral upper and lower extremities. Musculoskeletal: No warmth swelling or erythema around joints, no spinal tenderness noted. However she does have swelling of the right arm.     Data Reviewed: Basic Metabolic Panel:  Recent Labs Lab 10/10/16 0402 10/10/16 0411 10/13/16 1245  NA  --  139 137  K  --  3.4* 3.3*  CL  --  107 106  CO2  --  26 26  GLUCOSE  --  90 120*  BUN  --  <5* 5*  CREATININE  --  0.32* 0.45  CALCIUM  --  8.4* 7.9*  MG 1.8  --   --    Liver Function Tests:  Recent Labs Lab 10/13/16 1245  AST  93*  ALT 26  ALKPHOS 108  BILITOT 12.5*  PROT 6.5  ALBUMIN 2.1*   No results for input(s): LIPASE, AMYLASE in the last 168 hours. No results for input(s): AMMONIA in the last 168 hours. CBC:  Recent Labs Lab 10/07/16 2054 10/09/16 1225 10/10/16 0411 10/11/16 1000 10/13/16 1245  WBC 10.8* 10.3 10.2 9.0 10.1  NEUTROABS 5.7 6.0 6.1 4.9 5.3  HGB 6.5* 6.3* 6.6* 6.7* 6.7*  HCT 17.8* 16.8* 18.1* 17.8* 18.5*  MCV 91.8 92.8 92.8 92.7 93.4  PLT 113* 104* 114* 120* 134*   Cardiac Enzymes: No results for input(s): CKTOTAL, CKMB, CKMBINDEX, TROPONINI in the last 168 hours. BNP (last 3 results) No results for input(s): BNP in the last 8760 hours.  ProBNP (last 3 results) No results for input(s): PROBNP in the last 8760 hours.  CBG: No results for input(s): GLUCAP in the last 168 hours.  No results found for this or any previous visit (from the past 240  hour(s)).   Studies: Dg Chest 2 View  Result Date: 10/02/2016 CLINICAL DATA:  Sickle-cell crisis.  Short of breath. EXAM: CHEST  2 VIEW COMPARISON:  09/30/2016 FINDINGS: Mild cardiac enlargement. Lungs are clear without infiltrate effusion or edema. Port-A-Cath tip in the lower SVC unchanged. No change from the prior study. IMPRESSION: No active cardiopulmonary disease. Electronically Signed   By: Franchot Gallo M.D.   On: 10/02/2016 19:09   Dg Chest 2 View  Result Date: 09/30/2016 CLINICAL DATA:  36 year old with current history of sickle cell disease, presenting with 2 week history of intermittent chest pain which acutely worsened today. EXAM: CHEST  2 VIEW COMPARISON:  08/03/2016, 04/28/2016 and earlier, including CTA chest 08/25/2015. FINDINGS: Cardiac silhouette upper normal in size to slightly enlarged, unchanged. Hilar and mediastinal contours otherwise unremarkable. Right jugular Port-A-Cath tip remains in the lower SVC, unchanged. Lungs clear. Bronchovascular markings normal. Pulmonary vascularity normal. No visible pleural effusions. No pneumothorax. Visualized bony thorax intact. IMPRESSION: Stable borderline heart size.  No acute cardiopulmonary disease. Electronically Signed   By: Evangeline Dakin M.D.   On: 09/30/2016 18:01   Ct Angio Chest Pe W Or Wo Contrast  Result Date: 10/03/2016 CLINICAL DATA:  Sickle cell anemia, worsening lower extremity and chest pain EXAM: CT ANGIOGRAPHY CHEST WITH CONTRAST TECHNIQUE: Multidetector CT imaging of the chest was performed using the standard protocol during bolus administration of intravenous contrast. Multiplanar CT image reconstructions and MIPs were obtained to evaluate the vascular anatomy. CONTRAST:  100 cc Isovue COMPARISON:  08/25/2015 FINDINGS: Cardiovascular: Borderline cardiomegaly. No pericardial effusion. There is right IJ Port-A-Cath with tip in SVC right atrium junction. No pulmonary embolus No aortic aneurysm of aortic dissection.  Mediastinum/Nodes: No mediastinal hematoma or adenopathy. Central airway is patent. Lungs/Pleura: Mild central venous congestion. Images of the lung parenchyma shows no infiltrate or pulmonary edema. No focal consolidation. Mild dependent atelectasis noted bilateral lower lobe posteriorly. There is no pneumothorax. No bronchiectasis. No emphysema. Upper Abdomen: The visualized upper abdomen shows status post cholecystectomy. No adrenal gland mass. There is nodular contour of the liver consistent with cirrhosis. Lobulated contour bilateral kidney. Musculoskeletal: Stable sclerotic bony changes within thoracic spine and sternum consistent with history of sickle cell anemia. No bone destruction or acute fracture. Stable sclerotic changes bilateral proximal humerus. Review of the MIP images confirms the above findings. IMPRESSION: 1. No pulmonary embolus is noted. 2. No mediastinal hematoma or adenopathy. 3. Borderline cardiomegaly.  No pericardial effusion. 4. Central mild venous congestion without pulmonary edema.  No segmental consolidation. Mild dependent atelectasis bilateral lower lobe posteriorly. No pneumothorax. 5. Stable chronic sclerotic bony changes. 6. Again noted nodular contour of the liver consistent with cirrhosis. Status post cholecystectomy. Electronically Signed   By: Lahoma Crocker M.D.   On: 10/03/2016 08:40   Ir Veno/ext/uni Right  Result Date: 10/12/2016 CLINICAL DATA:  Sickle cell disease. Right IJ port catheter placement 07/17/2016 due to poor peripheral venous access and recurrent venous access needs. Right arm swelling. Negative Doppler. EXAM: RIGHT EXTREMITY VENOGRAPHY; IR PTA VENOUS EXCEPT DIALYSIS CIRCUIT; IR ULTRASOUND GUIDANCE VASC ACCESS RIGHT PROCEDURE: The procedure, risks (including but not limited to bleeding, infection, organ damage ), benefits, and alternatives were explained to the patient. Questions regarding the procedure were encouraged and answered. The patient understands and  consents to the procedure. Right upper arm prepped with chlorhexidine, draped in usual sterile fashion. Under real-time ultrasound guidance, the right brachial vein was accessed with a micropuncture 21 gauge Set for right upper extremity venography. Ultrasound documentation was saved. Right venous access and surrounding brachial region prepped and draped in usual sterile fashion. Maximal barrier sterile technique was utilized including caps, mask, sterile gowns, sterile gloves, sterile drape, hand hygiene and skin antiseptic. The right venous access was exchanged over a Bentson wire for a 7 [place] vascular sheath, through which an angled 5 [place] angiographic catheter was advanced and negotiated across the right brachiocephalic venous occlusion using an angled Glidewire. 3000 units heparin given IV. The catheter was exchanged over an Amplatz wire for a a 12 mm x 4 cm atlas angioplasty balloon, used to dilate the is obstruction. After final venogram, the catheter was removed and hemostasis achieved at the site. The patient tolerated the procedure well. ANESTHESIA/SEDATION: Intravenous Fentanyl and Versed were administered as conscious sedation during continuous cardiorespiratory monitoring by the radiology RN, with a total moderate sedation time of 16 minutes. MEDICATIONS: Lidocaine 1% subcutaneous CONTRAST:  1 ISOVUE-300 IOPAMIDOL (ISOVUE-300) INJECTION 61%, 1 ISOVUE-300 IOPAMIDOL (ISOVUE-300) INJECTION XX123456 COMPLICATIONS: None immediate FINDINGS: Right upper extremity venography demonstrates patency of right axillary and subclavian veins. There is a high-grade stenosis in the right brachiocephalic vein with right upper extremity drainage via thyrocervical and body wall collaterals. The collateral flow ultimately opacifies the left innominate vein, azygos arch, and SVC, shown to be patent to the right atrium. The right brachiocephalic vein stenosis responded well to the 12 mm balloon angioplasty. Follow-up venogram  shows no significant residual or recurrent stenosis. No extravasation, dissection, or other apparent complication. No further filling of collateral channels. IMPRESSION: 1. High-grade right brachiocephalic vein stenosis, which likely accounted for the patient's right upper extremity swelling. 2. Good response of the lesion to 12 mm balloon angioplasty. Consider continued low level anticoagulation to prevent recurrence. Electronically Signed   By: Lucrezia Europe M.D.   On: 10/12/2016 11:18   Ir US Guide Vasc Access Right  Result Date: 10/12/2016 CLINICAL DATA:  Sickle cell disease. Right IJ port catheter placement 07/17/2016 due to poor peripheral venous access and recurrent venous access needs. Right arm swelling. Negative Doppler. EXAM: RIGHT EXTREMITY VENOGRAPHY; IR PTA VENOUS EXCEPT DIALYSIS CIRCUIT; IR ULTRASOUND GUIDANCE VASC ACCESS RIGHT PROCEDURE: The procedure, risks (including but not limited to bleeding, infection, organ damage ), benefits, and alternatives were explained to the patient. Questions regarding the procedure were encouraged and answered. The patient understands and consents to the procedure. Right upper arm prepped with chlorhexidine, draped in usual sterile fashion. Under real-time ultrasound guidance, the right brachial vein was accessed with  a micropuncture 21 gauge Set for right upper extremity venography. Ultrasound documentation was saved. Right venous access and surrounding brachial region prepped and draped in usual sterile fashion. Maximal barrier sterile technique was utilized including caps, mask, sterile gowns, sterile gloves, sterile drape, hand hygiene and skin antiseptic. The right venous access was exchanged over a Bentson wire for a 7 [place] vascular sheath, through which an angled 5 [place] angiographic catheter was advanced and negotiated across the right brachiocephalic venous occlusion using an angled Glidewire. 3000 units heparin given IV. The catheter was exchanged over  an Amplatz wire for a a 12 mm x 4 cm atlas angioplasty balloon, used to dilate the is obstruction. After final venogram, the catheter was removed and hemostasis achieved at the site. The patient tolerated the procedure well. ANESTHESIA/SEDATION: Intravenous Fentanyl and Versed were administered as conscious sedation during continuous cardiorespiratory monitoring by the radiology RN, with a total moderate sedation time of 16 minutes. MEDICATIONS: Lidocaine 1% subcutaneous CONTRAST:  1 ISOVUE-300 IOPAMIDOL (ISOVUE-300) INJECTION 61%, 1 ISOVUE-300 IOPAMIDOL (ISOVUE-300) INJECTION XX123456 COMPLICATIONS: None immediate FINDINGS: Right upper extremity venography demonstrates patency of right axillary and subclavian veins. There is a high-grade stenosis in the right brachiocephalic vein with right upper extremity drainage via thyrocervical and body wall collaterals. The collateral flow ultimately opacifies the left innominate vein, azygos arch, and SVC, shown to be patent to the right atrium. The right brachiocephalic vein stenosis responded well to the 12 mm balloon angioplasty. Follow-up venogram shows no significant residual or recurrent stenosis. No extravasation, dissection, or other apparent complication. No further filling of collateral channels. IMPRESSION: 1. High-grade right brachiocephalic vein stenosis, which likely accounted for the patient's right upper extremity swelling. 2. Good response of the lesion to 12 mm balloon angioplasty. Consider continued low level anticoagulation to prevent recurrence. Electronically Signed   By: Lucrezia Europe M.D.   On: 10/12/2016 11:18   Ir Pta Venous Except Dialysis Circuit  Result Date: 10/12/2016 CLINICAL DATA:  Sickle cell disease. Right IJ port catheter placement 07/17/2016 due to poor peripheral venous access and recurrent venous access needs. Right arm swelling. Negative Doppler. EXAM: RIGHT EXTREMITY VENOGRAPHY; IR PTA VENOUS EXCEPT DIALYSIS CIRCUIT; IR ULTRASOUND  GUIDANCE VASC ACCESS RIGHT PROCEDURE: The procedure, risks (including but not limited to bleeding, infection, organ damage ), benefits, and alternatives were explained to the patient. Questions regarding the procedure were encouraged and answered. The patient understands and consents to the procedure. Right upper arm prepped with chlorhexidine, draped in usual sterile fashion. Under real-time ultrasound guidance, the right brachial vein was accessed with a micropuncture 21 gauge Set for right upper extremity venography. Ultrasound documentation was saved. Right venous access and surrounding brachial region prepped and draped in usual sterile fashion. Maximal barrier sterile technique was utilized including caps, mask, sterile gowns, sterile gloves, sterile drape, hand hygiene and skin antiseptic. The right venous access was exchanged over a Bentson wire for a 7 [place] vascular sheath, through which an angled 5 [place] angiographic catheter was advanced and negotiated across the right brachiocephalic venous occlusion using an angled Glidewire. 3000 units heparin given IV. The catheter was exchanged over an Amplatz wire for a a 12 mm x 4 cm atlas angioplasty balloon, used to dilate the is obstruction. After final venogram, the catheter was removed and hemostasis achieved at the site. The patient tolerated the procedure well. ANESTHESIA/SEDATION: Intravenous Fentanyl and Versed were administered as conscious sedation during continuous cardiorespiratory monitoring by the radiology RN, with a total moderate sedation time  of 16 minutes. MEDICATIONS: Lidocaine 1% subcutaneous CONTRAST:  1 ISOVUE-300 IOPAMIDOL (ISOVUE-300) INJECTION 61%, 1 ISOVUE-300 IOPAMIDOL (ISOVUE-300) INJECTION XX123456 COMPLICATIONS: None immediate FINDINGS: Right upper extremity venography demonstrates patency of right axillary and subclavian veins. There is a high-grade stenosis in the right brachiocephalic vein with right upper extremity drainage via  thyrocervical and body wall collaterals. The collateral flow ultimately opacifies the left innominate vein, azygos arch, and SVC, shown to be patent to the right atrium. The right brachiocephalic vein stenosis responded well to the 12 mm balloon angioplasty. Follow-up venogram shows no significant residual or recurrent stenosis. No extravasation, dissection, or other apparent complication. No further filling of collateral channels. IMPRESSION: 1. High-grade right brachiocephalic vein stenosis, which likely accounted for the patient's right upper extremity swelling. 2. Good response of the lesion to 12 mm balloon angioplasty. Consider continued low level anticoagulation to prevent recurrence. Electronically Signed   By: Lucrezia Europe M.D.   On: 10/12/2016 11:18    Scheduled Meds: . sodium chloride   Intravenous Once  . folic acid  1 mg Oral Daily  . HYDROmorphone   Intravenous Q4H  . hydroxyurea  1,000 mg Oral Daily  . loratadine  10 mg Oral Daily  . morphine  15 mg Oral Q12H  . multivitamin with minerals  1 tablet Oral Daily  . oxyCODONE  15 mg Oral Q4H  . potassium chloride  40 mEq Oral BID  . senna-docusate  1 tablet Oral BID  . Vitamin D (Ergocalciferol)  50,000 Units Oral Q7 days   Continuous Infusions: . dextrose 5 % and 0.45% NaCl 10 mL/hr at 10/13/16 0447    Principal Problem:   Sickle cell pain crisis [facility]) Active Problems:   Chest pain   Sickle cell crisis ([facility])     In excess of 25 minutes spent during this consult. Greater than 50% involved face to face contact with the patient for assessment, counseling and coordination of care.

## 2016-10-14 MED ORDER — HYDROMORPHONE 1 MG/ML IV SOLN
INTRAVENOUS | Status: DC
  Administered 2016-10-14: 6.3 mg via INTRAVENOUS
  Administered 2016-10-14: 4.9 mg via INTRAVENOUS
  Administered 2016-10-14: 1 mg via INTRAVENOUS
  Administered 2016-10-14: 6.3 mg via INTRAVENOUS
  Administered 2016-10-14: 5.6 mg via INTRAVENOUS
  Administered 2016-10-15: 3.6 mg via INTRAVENOUS
  Administered 2016-10-15: 5.6 mg via INTRAVENOUS
  Administered 2016-10-15: 7 mg via INTRAVENOUS
  Administered 2016-10-15: 7.29 mg via INTRAVENOUS
  Filled 2016-10-14: qty 25

## 2016-10-14 NOTE — Progress Notes (Signed)
SICKLE CELL SERVICE PROGRESS NOTE  Dawn [name] [id number] DOB: 10-28-1980 DOA: 10/02/2016 PCP: Angelica Chessman, MD  Assessment/Plan: Principal Problem:   Sickle cell pain crisis ([place]) Active Problems:   Chest pain   Sickle cell crisis ([place])  1. Hb SS with crisis: Much improved. Pain is down to 5/10 now. Continue PCA bolus, DC clinician assisted dose and  Continue Toradol. Started home short acting medications in preparation for possible DC in am.  2. Anemia of Chronic Disease: Hb is stable. Continue to monitor. 3. Swelling Right Arm: Pt continues to have a difference in size of extremities from side to side but slowly improving. No increase in pain or color.   4. Chronic pain: Continue MS Contin   Code Status: Full Code Family Communication: N/A Disposition Plan: Not yet ready for discharge  [facility]  Pager [phone]. If 7PM-7AM, please contact night-coverage.  10/14/2016, 5:52 PM  LOS: 11 days   Interim History: Pt rates her pain as 5/10.  She has used 22 mg with 44/42: demands/deliveries on her PCA in the last 24 hours.  Consultants:  None  Procedures:  None  Antibiotics:  None   Objective: Vitals:   10/14/16 0742 10/14/16 1037 10/14/16 1200 10/14/16 1448  BP:  131/67  136/86  Pulse:  (!) 102  (!) 101  Resp: 17 18 15 16   Temp:  99.3 F (37.4 C)  98.9 F (37.2 C)  TempSrc:  Oral  Oral  SpO2: 96% 95% 94% 93%  Weight:      Height:       Weight change:   Intake/Output Summary (Last 24 hours) at 10/14/16 1752 Last data filed at 10/14/16 1449  Gross per 24 hour  Intake           1320.5 ml  Output                0 ml  Net           1320.5 ml    General: Alert, awake, oriented x3, in mild distress.  HEENT: [place]/AT PEERL, EOMI, moderate icterus increased over baseline.  Neck: Trachea midline,  no masses, no thyromegal,y no JVD, no carotid bruit Heart: Regular rate and rhythm, without murmurs, rubs, gallops, PMI non-displaced, no heaves or thrills on  palpation.  Lungs: Clear to auscultation, no wheezing or rhonchi noted. No increased vocal fremitus resonant to percussion.   Abdomen:Obese, soft, nontender, nondistended, positive bowel sounds, no masses no hepatosplenomegaly noted.  Neuro: No focal neurological deficits noted cranial nerves II through XII grossly intact. Strength at functional baseline in bilateral upper and lower extremities. Musculoskeletal: No warmth swelling or erythema around joints, no spinal tenderness noted. However she does have swelling of the right arm.     Data Reviewed: Basic Metabolic Panel:  Recent Labs Lab 10/10/16 0402 10/10/16 0411 10/13/16 1245  NA  --  139 137  K  --  3.4* 3.3*  CL  --  107 106  CO2  --  26 26  GLUCOSE  --  90 120*  BUN  --  <5* 5*  CREATININE  --  0.32* 0.45  CALCIUM  --  8.4* 7.9*  MG 1.8  --   --    Liver Function Tests:  Recent Labs Lab 10/13/16 1245  AST 93*  ALT 26  ALKPHOS 108  BILITOT 12.5*  PROT 6.5  ALBUMIN 2.1*   No results for input(s): LIPASE, AMYLASE in the last 168 hours. No results for input(s): AMMONIA  in the last 168 hours. CBC:  Recent Labs Lab 10/07/16 2054 10/09/16 1225 10/10/16 0411 10/11/16 1000 10/13/16 1245  WBC 10.8* 10.3 10.2 9.0 10.1  NEUTROABS 5.7 6.0 6.1 4.9 5.3  HGB 6.5* 6.3* 6.6* 6.7* 6.7*  HCT 17.8* 16.8* 18.1* 17.8* 18.5*  MCV 91.8 92.8 92.8 92.7 93.4  PLT 113* 104* 114* 120* 134*   Cardiac Enzymes: No results for input(s): CKTOTAL, CKMB, CKMBINDEX, TROPONINI in the last 168 hours. BNP (last 3 results) No results for input(s): BNP in the last 8760 hours.  ProBNP (last 3 results) No results for input(s): PROBNP in the last 8760 hours.  CBG: No results for input(s): GLUCAP in the last 168 hours.  No results found for this or any previous visit (from the past 240 hour(s)).   Studies: Dg Chest 2 View  Result Date: 10/02/2016 CLINICAL DATA:  Sickle-cell crisis.  Short of breath. EXAM: CHEST  2 VIEW COMPARISON:   09/30/2016 FINDINGS: Mild cardiac enlargement. Lungs are clear without infiltrate effusion or edema. Port-A-Cath tip in the lower SVC unchanged. No change from the prior study. IMPRESSION: No active cardiopulmonary disease. Electronically Signed   By: Franchot Gallo M.D.   On: 10/02/2016 19:09   Dg Chest 2 View  Result Date: 09/30/2016 CLINICAL DATA:  36 year old with current history of sickle cell disease, presenting with 2 week history of intermittent chest pain which acutely worsened today. EXAM: CHEST  2 VIEW COMPARISON:  08/03/2016, 04/28/2016 and earlier, including CTA chest 08/25/2015. FINDINGS: Cardiac silhouette upper normal in size to slightly enlarged, unchanged. Hilar and mediastinal contours otherwise unremarkable. Right jugular Port-A-Cath tip remains in the lower SVC, unchanged. Lungs clear. Bronchovascular markings normal. Pulmonary vascularity normal. No visible pleural effusions. No pneumothorax. Visualized bony thorax intact. IMPRESSION: Stable borderline heart size.  No acute cardiopulmonary disease. Electronically Signed   By: Evangeline Dakin M.D.   On: 09/30/2016 18:01   Ct Angio Chest Pe W Or Wo Contrast  Result Date: 10/03/2016 CLINICAL DATA:  Sickle cell anemia, worsening lower extremity and chest pain EXAM: CT ANGIOGRAPHY CHEST WITH CONTRAST TECHNIQUE: Multidetector CT imaging of the chest was performed using the standard protocol during bolus administration of intravenous contrast. Multiplanar CT image reconstructions and MIPs were obtained to evaluate the vascular anatomy. CONTRAST:  100 cc Isovue COMPARISON:  08/25/2015 FINDINGS: Cardiovascular: Borderline cardiomegaly. No pericardial effusion. There is right IJ Port-A-Cath with tip in SVC right atrium junction. No pulmonary embolus No aortic aneurysm of aortic dissection. Mediastinum/Nodes: No mediastinal hematoma or adenopathy. Central airway is patent. Lungs/Pleura: Mild central venous congestion. Images of the lung parenchyma  shows no infiltrate or pulmonary edema. No focal consolidation. Mild dependent atelectasis noted bilateral lower lobe posteriorly. There is no pneumothorax. No bronchiectasis. No emphysema. Upper Abdomen: The visualized upper abdomen shows status post cholecystectomy. No adrenal gland mass. There is nodular contour of the liver consistent with cirrhosis. Lobulated contour bilateral kidney. Musculoskeletal: Stable sclerotic bony changes within thoracic spine and sternum consistent with history of sickle cell anemia. No bone destruction or acute fracture. Stable sclerotic changes bilateral proximal humerus. Review of the MIP images confirms the above findings. IMPRESSION: 1. No pulmonary embolus is noted. 2. No mediastinal hematoma or adenopathy. 3. Borderline cardiomegaly.  No pericardial effusion. 4. Central mild venous congestion without pulmonary edema. No segmental consolidation. Mild dependent atelectasis bilateral lower lobe posteriorly. No pneumothorax. 5. Stable chronic sclerotic bony changes. 6. Again noted nodular contour of the liver consistent with cirrhosis. Status post cholecystectomy. Electronically Signed  By: Lahoma Crocker M.D.   On: 10/03/2016 08:40   Ir Veno/ext/uni Right  Result Date: 10/12/2016 CLINICAL DATA:  Sickle cell disease. Right IJ port catheter placement 07/17/2016 due to poor peripheral venous access and recurrent venous access needs. Right arm swelling. Negative Doppler. EXAM: RIGHT EXTREMITY VENOGRAPHY; IR PTA VENOUS EXCEPT DIALYSIS CIRCUIT; IR ULTRASOUND GUIDANCE VASC ACCESS RIGHT PROCEDURE: The procedure, risks (including but not limited to bleeding, infection, organ damage ), benefits, and alternatives were explained to the patient. Questions regarding the procedure were encouraged and answered. The patient understands and consents to the procedure. Right upper arm prepped with chlorhexidine, draped in usual sterile fashion. Under real-time ultrasound guidance, the right brachial  vein was accessed with a micropuncture 21 gauge Set for right upper extremity venography. Ultrasound documentation was saved. Right venous access and surrounding brachial region prepped and draped in usual sterile fashion. Maximal barrier sterile technique was utilized including caps, mask, sterile gowns, sterile gloves, sterile drape, hand hygiene and skin antiseptic. The right venous access was exchanged over a Bentson wire for a 7 [place] vascular sheath, through which an angled 5 [place] angiographic catheter was advanced and negotiated across the right brachiocephalic venous occlusion using an angled Glidewire. 3000 units heparin given IV. The catheter was exchanged over an Amplatz wire for a a 12 mm x 4 cm atlas angioplasty balloon, used to dilate the is obstruction. After final venogram, the catheter was removed and hemostasis achieved at the site. The patient tolerated the procedure well. ANESTHESIA/SEDATION: Intravenous Fentanyl and Versed were administered as conscious sedation during continuous cardiorespiratory monitoring by the radiology RN, with a total moderate sedation time of 16 minutes. MEDICATIONS: Lidocaine 1% subcutaneous CONTRAST:  1 ISOVUE-300 IOPAMIDOL (ISOVUE-300) INJECTION 61%, 1 ISOVUE-300 IOPAMIDOL (ISOVUE-300) INJECTION XX123456 COMPLICATIONS: None immediate FINDINGS: Right upper extremity venography demonstrates patency of right axillary and subclavian veins. There is a high-grade stenosis in the right brachiocephalic vein with right upper extremity drainage via thyrocervical and body wall collaterals. The collateral flow ultimately opacifies the left innominate vein, azygos arch, and SVC, shown to be patent to the right atrium. The right brachiocephalic vein stenosis responded well to the 12 mm balloon angioplasty. Follow-up venogram shows no significant residual or recurrent stenosis. No extravasation, dissection, or other apparent complication. No further filling of collateral channels.  IMPRESSION: 1. High-grade right brachiocephalic vein stenosis, which likely accounted for the patient's right upper extremity swelling. 2. Good response of the lesion to 12 mm balloon angioplasty. Consider continued low level anticoagulation to prevent recurrence. Electronically Signed   By: Lucrezia Europe M.D.   On: 10/12/2016 11:18   Ir US Guide Vasc Access Right  Result Date: 10/12/2016 CLINICAL DATA:  Sickle cell disease. Right IJ port catheter placement 07/17/2016 due to poor peripheral venous access and recurrent venous access needs. Right arm swelling. Negative Doppler. EXAM: RIGHT EXTREMITY VENOGRAPHY; IR PTA VENOUS EXCEPT DIALYSIS CIRCUIT; IR ULTRASOUND GUIDANCE VASC ACCESS RIGHT PROCEDURE: The procedure, risks (including but not limited to bleeding, infection, organ damage ), benefits, and alternatives were explained to the patient. Questions regarding the procedure were encouraged and answered. The patient understands and consents to the procedure. Right upper arm prepped with chlorhexidine, draped in usual sterile fashion. Under real-time ultrasound guidance, the right brachial vein was accessed with a micropuncture 21 gauge Set for right upper extremity venography. Ultrasound documentation was saved. Right venous access and surrounding brachial region prepped and draped in usual sterile fashion. Maximal barrier sterile technique was utilized including caps,  mask, sterile gowns, sterile gloves, sterile drape, hand hygiene and skin antiseptic. The right venous access was exchanged over a Bentson wire for a 7 [place] vascular sheath, through which an angled 5 [place] angiographic catheter was advanced and negotiated across the right brachiocephalic venous occlusion using an angled Glidewire. 3000 units heparin given IV. The catheter was exchanged over an Amplatz wire for a a 12 mm x 4 cm atlas angioplasty balloon, used to dilate the is obstruction. After final venogram, the catheter was removed and  hemostasis achieved at the site. The patient tolerated the procedure well. ANESTHESIA/SEDATION: Intravenous Fentanyl and Versed were administered as conscious sedation during continuous cardiorespiratory monitoring by the radiology RN, with a total moderate sedation time of 16 minutes. MEDICATIONS: Lidocaine 1% subcutaneous CONTRAST:  1 ISOVUE-300 IOPAMIDOL (ISOVUE-300) INJECTION 61%, 1 ISOVUE-300 IOPAMIDOL (ISOVUE-300) INJECTION XX123456 COMPLICATIONS: None immediate FINDINGS: Right upper extremity venography demonstrates patency of right axillary and subclavian veins. There is a high-grade stenosis in the right brachiocephalic vein with right upper extremity drainage via thyrocervical and body wall collaterals. The collateral flow ultimately opacifies the left innominate vein, azygos arch, and SVC, shown to be patent to the right atrium. The right brachiocephalic vein stenosis responded well to the 12 mm balloon angioplasty. Follow-up venogram shows no significant residual or recurrent stenosis. No extravasation, dissection, or other apparent complication. No further filling of collateral channels. IMPRESSION: 1. High-grade right brachiocephalic vein stenosis, which likely accounted for the patient's right upper extremity swelling. 2. Good response of the lesion to 12 mm balloon angioplasty. Consider continued low level anticoagulation to prevent recurrence. Electronically Signed   By: Lucrezia Europe M.D.   On: 10/12/2016 11:18   Ir Pta Venous Except Dialysis Circuit  Result Date: 10/12/2016 CLINICAL DATA:  Sickle cell disease. Right IJ port catheter placement 07/17/2016 due to poor peripheral venous access and recurrent venous access needs. Right arm swelling. Negative Doppler. EXAM: RIGHT EXTREMITY VENOGRAPHY; IR PTA VENOUS EXCEPT DIALYSIS CIRCUIT; IR ULTRASOUND GUIDANCE VASC ACCESS RIGHT PROCEDURE: The procedure, risks (including but not limited to bleeding, infection, organ damage ), benefits, and alternatives were  explained to the patient. Questions regarding the procedure were encouraged and answered. The patient understands and consents to the procedure. Right upper arm prepped with chlorhexidine, draped in usual sterile fashion. Under real-time ultrasound guidance, the right brachial vein was accessed with a micropuncture 21 gauge Set for right upper extremity venography. Ultrasound documentation was saved. Right venous access and surrounding brachial region prepped and draped in usual sterile fashion. Maximal barrier sterile technique was utilized including caps, mask, sterile gowns, sterile gloves, sterile drape, hand hygiene and skin antiseptic. The right venous access was exchanged over a Bentson wire for a 7 [place] vascular sheath, through which an angled 5 [place] angiographic catheter was advanced and negotiated across the right brachiocephalic venous occlusion using an angled Glidewire. 3000 units heparin given IV. The catheter was exchanged over an Amplatz wire for a a 12 mm x 4 cm atlas angioplasty balloon, used to dilate the is obstruction. After final venogram, the catheter was removed and hemostasis achieved at the site. The patient tolerated the procedure well. ANESTHESIA/SEDATION: Intravenous Fentanyl and Versed were administered as conscious sedation during continuous cardiorespiratory monitoring by the radiology RN, with a total moderate sedation time of 16 minutes. MEDICATIONS: Lidocaine 1% subcutaneous CONTRAST:  1 ISOVUE-300 IOPAMIDOL (ISOVUE-300) INJECTION 61%, 1 ISOVUE-300 IOPAMIDOL (ISOVUE-300) INJECTION XX123456 COMPLICATIONS: None immediate FINDINGS: Right upper extremity venography demonstrates patency of right axillary and subclavian  veins. There is a high-grade stenosis in the right brachiocephalic vein with right upper extremity drainage via thyrocervical and body wall collaterals. The collateral flow ultimately opacifies the left innominate vein, azygos arch, and SVC, shown to be patent to the right  atrium. The right brachiocephalic vein stenosis responded well to the 12 mm balloon angioplasty. Follow-up venogram shows no significant residual or recurrent stenosis. No extravasation, dissection, or other apparent complication. No further filling of collateral channels. IMPRESSION: 1. High-grade right brachiocephalic vein stenosis, which likely accounted for the patient's right upper extremity swelling. 2. Good response of the lesion to 12 mm balloon angioplasty. Consider continued low level anticoagulation to prevent recurrence. Electronically Signed   By: Lucrezia Europe M.D.   On: 10/12/2016 11:18    Scheduled Meds: . sodium chloride   Intravenous Once  . folic acid  1 mg Oral Daily  . HYDROmorphone   Intravenous Q4H  . hydroxyurea  1,000 mg Oral Daily  . loratadine  10 mg Oral Daily  . morphine  15 mg Oral Q12H  . multivitamin with minerals  1 tablet Oral Daily  . oxyCODONE  15 mg Oral Q4H  . potassium chloride  40 mEq Oral BID  . senna-docusate  1 tablet Oral BID  . Vitamin D (Ergocalciferol)  50,000 Units Oral Q7 days   Continuous Infusions: . dextrose 5 % and 0.45% NaCl 10 mL/hr at 10/13/16 0447    Principal Problem:   Sickle cell pain crisis [facility]) Active Problems:   Chest pain   Sickle cell crisis ([facility])     In excess of 25 minutes spent during this consult. Greater than 50% involved face to face contact with the patient for assessment, counseling and coordination of care.

## 2016-10-14 NOTE — Care Management Note (Signed)
Case Management Note  Patient Details  Name: Dawn [name] MRN: [id number] Date of Birth: 03/01/1981  Subjective/Objective:  Sickle Cell Anemia Pain Crisis                  Action/Plan: Discharge Planning: Chart reviewed. Will continue to follow for dc needs.   PCP- Angelica Chessman E MD   Expected Discharge Date:                Expected Discharge Plan:  Home/Self Care  In-House Referral:  NA  Discharge planning Services  CM Consult  Post Acute Care Choice:  NA Choice offered to:  NA  DME Arranged:  N/A DME Agency:  NA  HH Arranged:  NA HH Agency:  NA  Status of Service:  In process, will continue to follow  If discussed at Long Length of Stay Meetings, dates discussed:    Additional Comments:  Erenest Rasher, RN 10/14/2016, 5:17 PM

## 2016-10-15 ENCOUNTER — Inpatient Hospital Stay (HOSPITAL_COMMUNITY): Payer: Medicare Other

## 2016-10-15 DIAGNOSIS — M7989 Other specified soft tissue disorders: Secondary | ICD-10-CM

## 2016-10-15 DIAGNOSIS — E876 Hypokalemia: Secondary | ICD-10-CM

## 2016-10-15 DIAGNOSIS — D696 Thrombocytopenia, unspecified: Secondary | ICD-10-CM

## 2016-10-15 DIAGNOSIS — I871 Compression of vein: Secondary | ICD-10-CM

## 2016-10-15 LAB — BASIC METABOLIC PANEL
Anion gap: 5 (ref 5–15)
Anion gap: 6 (ref 5–15)
BUN: 6 mg/dL (ref 6–20)
CALCIUM: 8 mg/dL — AB (ref 8.9–10.3)
CHLORIDE: 103 mmol/L (ref 101–111)
CO2: 26 mmol/L (ref 22–32)
CO2: 27 mmol/L (ref 22–32)
Calcium: 8.2 mg/dL — ABNORMAL LOW (ref 8.9–10.3)
Chloride: 105 mmol/L (ref 101–111)
Creatinine, Ser: 0.48 mg/dL (ref 0.44–1.00)
GFR calc Af Amer: >60 mL/min (ref >60)
Glucose, Bld: 122 mg/dL — ABNORMAL HIGH (ref 65–99)
Glucose, Bld: 127 mg/dL — ABNORMAL HIGH (ref 65–99)
POTASSIUM: 3.3 mmol/L — AB (ref 3.5–5.1)
Potassium: 4.3 mmol/L (ref 3.5–5.1)
SODIUM: 136 mmol/L (ref 135–145)
Sodium: 136 mmol/L (ref 135–145)

## 2016-10-15 LAB — CBC WITH DIFFERENTIAL/PLATELET
Basophils Absolute: 0 10*3/uL (ref 0.0–0.1)
Basophils Relative: 0 %
Eosinophils Absolute: 0.3 10*3/uL (ref 0.0–0.7)
Eosinophils Relative: 3 %
HCT: 18.9 % — ABNORMAL LOW (ref 36.0–46.0)
Hemoglobin: 7 g/dL — ABNORMAL LOW (ref 12.0–15.0)
Lymphocytes Relative: 25 %
Lymphs Abs: 2.4 10*3/uL (ref 0.7–4.0)
MCH: 35.4 pg — ABNORMAL HIGH (ref 26.0–34.0)
MCHC: 37 g/dL — ABNORMAL HIGH (ref 30.0–36.0)
MCV: 95.5 fL (ref 78.0–100.0)
Monocytes Absolute: 1.4 10*3/uL — ABNORMAL HIGH (ref 0.1–1.0)
Monocytes Relative: 14 %
Neutro Abs: 5.6 10*3/uL (ref 1.7–7.7)
Neutrophils Relative %: 58 %
Platelets: 168 10*3/uL (ref 150–400)
RBC: 1.98 MIL/uL — ABNORMAL LOW (ref 3.87–5.11)
RDW: 22.3 % — ABNORMAL HIGH (ref 11.5–15.5)
WBC: 9.7 10*3/uL (ref 4.0–10.5)
nRBC: 5 /100 WBC — ABNORMAL HIGH

## 2016-10-15 LAB — LACTATE DEHYDROGENASE: LDH: 405 U/L — ABNORMAL HIGH (ref 98–192)

## 2016-10-15 LAB — RETICULOCYTES
RBC.: 1.98 MIL/uL — ABNORMAL LOW (ref 3.87–5.11)
Retic Count, Absolute: 405.9 10*3/uL — ABNORMAL HIGH (ref 19.0–186.0)
Retic Ct Pct: 20.5 % — ABNORMAL HIGH (ref 0.4–3.1)

## 2016-10-15 LAB — MAGNESIUM: Magnesium: 1.6 mg/dL — ABNORMAL LOW (ref 1.7–2.4)

## 2016-10-15 MED ORDER — POTASSIUM CHLORIDE CRYS ER 20 MEQ PO TBCR: 40.0000 meq | EXTENDED_RELEASE_TABLET | Freq: Every day | ORAL | 0 refills | Status: DC

## 2016-10-15 MED ORDER — MAGNESIUM OXIDE 400 (241.3 MG) MG PO TABS: 400.0000 mg | ORAL_TABLET | Freq: Every day | ORAL | 1 refills | Status: DC

## 2016-10-15 MED ORDER — HYDROMORPHONE HCL 1 MG/ML IJ SOLN
1.0000 mg | INTRAMUSCULAR | Status: DC | PRN
  Administered 2016-10-15 – 2016-10-17 (×10): 1 mg via INTRAVENOUS
  Filled 2016-10-15 (×10): qty 1

## 2016-10-15 MED ORDER — WARFARIN SODIUM 2.5 MG PO TABS: 2.5000 mg | ORAL_TABLET | Freq: Every day | ORAL | 0 refills | Status: DC

## 2016-10-15 MED ORDER — POTASSIUM CHLORIDE CRYS ER 20 MEQ PO TBCR
40.0000 meq | EXTENDED_RELEASE_TABLET | Freq: Once | ORAL | Status: AC
  Administered 2016-10-15: 40 meq via ORAL
  Filled 2016-10-15: qty 2

## 2016-10-15 MED ORDER — MAGNESIUM SULFATE 2 GM/50ML IV SOLN
2.0000 g | Freq: Once | INTRAVENOUS | Status: AC
  Administered 2016-10-15: 2 g via INTRAVENOUS
  Filled 2016-10-15: qty 50

## 2016-10-15 NOTE — Progress Notes (Signed)
Patient ID: Dawn [name], female   DOB: 15-Oct-1980, 36 y.o.   MRN: [id number] Called by nurse for patient with syncopal episode. Pt's family was at bedside and patient and family report that patient expressed a feeling of warmth coming on and then a feeling that she was going to "pass out". She called the nurse on the call bell and then family reports she passed out. Nurse reports that when she arrived at bedside, patient was responsive then she became unresponsive with her eyes closed. However patient reports that she could hear her name being called but could not respond although she was trying to respond. Vital signs noted to be tachycardic and hypertensive. Per nurse she never lost HR and her saturations remained above 92% the entire episode. Nurse also reports that patient was unable to move her right side for about 1 minute. Nurse and family members deny and shaking or tonic clonic activity.   Upon my arrival pt was awake and A&O x 3. She was able to answer questions appropriately and relate entire episode. She denies any palpitations or abnormal heart rate. She demonstrates normal movement and normal tone of BUE's and BLE's. She had no loss of bowel or bladder function. Of note she felt an urge to urinate just prior to this episode and had just completed Magnesium infusion.   Assessment and Plan: I feel that the most likely etiology is vaso-vagal. However cannot completely eliminate the idea of seizure activity.  - Hold Discharge Home -Transfer to telemetry bed - Obtain CT head -Obtain EEG.  -Re-check electrolytes this evening after she has completed potassium and Magnesium.   [name],[name]

## 2016-10-15 NOTE — Discharge Summary (Signed)
Dawn [name] MRN: [id number] DOB/AGE: January 09, 1981 36 y.o.  Admit date: 10/02/2016 Discharge date: 10/15/2016  Primary Care Physician:  Angelica Chessman, MD   Discharge Diagnoses:   Patient Active Problem List   Diagnosis Date Noted  . 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 Positive D dimer   . Pulmonary hypertension 02/16/2016  . 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 Anemia of chronic disease   . Chronic pain syndrome 10/14/2015  . 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 Vitamin B12 deficiency 07/07/2015  . Hyperbilirubinemia 09/11/2014  . Macrocytosis 06/13/2014  . Vitamin D deficiency 11/23/2013  . 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 Hypokalemia 03/28/2012  . 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 Elevated LFTs 04/27/2011  . Sickle cell disease ([place]) 04/26/2011    DISCHARGE MEDICATION: Allergies as of 10/15/2016      Reactions   Desferal [deferoxamine] Hives   Local reaction on arm only during infusion. Can take with benadryl    Latex Other (See Comments)   REACTION: Pt experiences a burning sensation on contacted skin areas   Lisinopril Other (See Comments), Cough   REACTION: Sore/scratchy throat   Tape Other (See Comments)   REACTION: Pt. Experiences a burning sensation on contacted skin areas   Tylenol [acetaminophen] Other (See Comments)   Pt. Does not take the following due to protection of kidney health. Past occurrence of Protein in urine.      Medication List    TAKE these medications   folic acid 1 MG tablet Commonly known as:  FOLVITE Take 1 tablet (1 mg total) by mouth daily.   hydroxyurea 500 MG capsule Commonly known as:  HYDREA Take 2 capsules (1,000 mg total) by mouth daily. May take with food to minimize GI side effects.   levonorgestrel 20 MCG/24HR IUD Commonly known as:  MIRENA 1 each by Intrauterine route once.   loratadine 10 MG tablet Commonly known as:  CLARITIN Take 10 mg by mouth daily.   magnesium oxide 400  (241.3 Mg) MG tablet Commonly known as:  MAG-OX Take 1 tablet (400 mg total) by mouth daily.   morphine 15 MG 12 hr tablet Commonly known as:  MS CONTIN Take 15 mg by mouth every 12 (twelve) hours.   multivitamin with minerals Tabs tablet Take 1 tablet by mouth daily.   Oxycodone HCl 10 MG Tabs Take 1 tablet (10 mg total) by mouth every 6 (six) hours as needed.   potassium chloride SA 20 MEQ tablet Commonly known as:  K-DUR,KLOR-CON Take 2 tablets (40 mEq total) by mouth daily.   promethazine 12.5 MG tablet Commonly known as:  PHENERGAN Take 1 tablet (12.5 mg total) by mouth every 6 (six) hours as needed for nausea or vomiting.   Vitamin D (Ergocalciferol) 50000 units Caps capsule Commonly known as:  DRISDOL Take 1 capsule (50,000 Units total) by mouth every 7 (seven) days. Takes on Sundays.   warfarin 2.5 MG tablet Commonly known as:  COUMADIN Take 1 tablet (2.5 mg total) by mouth daily.         Consults:    SIGNIFICANT DIAGNOSTIC STUDIES:  Dg Chest 2 View  Result Date: 10/02/2016 CLINICAL DATA:  Sickle-cell crisis.  Short of breath. EXAM: CHEST  2 VIEW COMPARISON:  09/30/2016 FINDINGS: Mild cardiac enlargement. Lungs are clear without infiltrate effusion or edema. Port-A-Cath tip in the lower SVC unchanged. No change from the prior study. IMPRESSION: No active cardiopulmonary disease. Electronically Signed   By: Franchot Gallo M.D.   On: 10/02/2016 19:09  Dg Chest 2 View  Result Date: 09/30/2016 CLINICAL DATA:  36 year old with current history of sickle cell disease, presenting with 2 week history of intermittent chest pain which acutely worsened today. EXAM: CHEST  2 VIEW COMPARISON:  08/03/2016, 04/28/2016 and earlier, including CTA chest 08/25/2015. FINDINGS: Cardiac silhouette upper normal in size to slightly enlarged, unchanged. Hilar and mediastinal contours otherwise unremarkable. Right jugular Port-A-Cath tip remains in the lower SVC, unchanged. Lungs clear.  Bronchovascular markings normal. Pulmonary vascularity normal. No visible pleural effusions. No pneumothorax. Visualized bony thorax intact. IMPRESSION: Stable borderline heart size.  No acute cardiopulmonary disease. Electronically Signed   By: Evangeline Dakin M.D.   On: 09/30/2016 18:01   Ct Angio Chest Pe W Or Wo Contrast  Result Date: 10/03/2016 CLINICAL DATA:  Sickle cell anemia, worsening lower extremity and chest pain EXAM: CT ANGIOGRAPHY CHEST WITH CONTRAST TECHNIQUE: Multidetector CT imaging of the chest was performed using the standard protocol during bolus administration of intravenous contrast. Multiplanar CT image reconstructions and MIPs were obtained to evaluate the vascular anatomy. CONTRAST:  100 cc Isovue COMPARISON:  08/25/2015 FINDINGS: Cardiovascular: Borderline cardiomegaly. No pericardial effusion. There is right IJ Port-A-Cath with tip in SVC right atrium junction. No pulmonary embolus No aortic aneurysm of aortic dissection. Mediastinum/Nodes: No mediastinal hematoma or adenopathy. Central airway is patent. Lungs/Pleura: Mild central venous congestion. Images of the lung parenchyma shows no infiltrate or pulmonary edema. No focal consolidation. Mild dependent atelectasis noted bilateral lower lobe posteriorly. There is no pneumothorax. No bronchiectasis. No emphysema. Upper Abdomen: The visualized upper abdomen shows status post cholecystectomy. No adrenal gland mass. There is nodular contour of the liver consistent with cirrhosis. Lobulated contour bilateral kidney. Musculoskeletal: Stable sclerotic bony changes within thoracic spine and sternum consistent with history of sickle cell anemia. No bone destruction or acute fracture. Stable sclerotic changes bilateral proximal humerus. Review of the MIP images confirms the above findings. IMPRESSION: 1. No pulmonary embolus is noted. 2. No mediastinal hematoma or adenopathy. 3. Borderline cardiomegaly.  No pericardial effusion. 4. Central  mild venous congestion without pulmonary edema. No segmental consolidation. Mild dependent atelectasis bilateral lower lobe posteriorly. No pneumothorax. 5. Stable chronic sclerotic bony changes. 6. Again noted nodular contour of the liver consistent with cirrhosis. Status post cholecystectomy. Electronically Signed   By: Lahoma Crocker M.D.   On: 10/03/2016 08:40   Ir Veno/ext/uni Right  Result Date: 10/12/2016 CLINICAL DATA:  Sickle cell disease. Right IJ port catheter placement 07/17/2016 due to poor peripheral venous access and recurrent venous access needs. Right arm swelling. Negative Doppler. EXAM: RIGHT EXTREMITY VENOGRAPHY; IR PTA VENOUS EXCEPT DIALYSIS CIRCUIT; IR ULTRASOUND GUIDANCE VASC ACCESS RIGHT PROCEDURE: The procedure, risks (including but not limited to bleeding, infection, organ damage ), benefits, and alternatives were explained to the patient. Questions regarding the procedure were encouraged and answered. The patient understands and consents to the procedure. Right upper arm prepped with chlorhexidine, draped in usual sterile fashion. Under real-time ultrasound guidance, the right brachial vein was accessed with a micropuncture 21 gauge Set for right upper extremity venography. Ultrasound documentation was saved. Right venous access and surrounding brachial region prepped and draped in usual sterile fashion. Maximal barrier sterile technique was utilized including caps, mask, sterile gowns, sterile gloves, sterile drape, hand hygiene and skin antiseptic. The right venous access was exchanged over a Bentson wire for a 7 [place] vascular sheath, through which an angled 5 [place] angiographic catheter was advanced and negotiated across the right brachiocephalic venous occlusion using an  angled Glidewire. 3000 units heparin given IV. The catheter was exchanged over an Amplatz wire for a a 12 mm x 4 cm atlas angioplasty balloon, used to dilate the is obstruction. After final venogram, the catheter  was removed and hemostasis achieved at the site. The patient tolerated the procedure well. ANESTHESIA/SEDATION: Intravenous Fentanyl and Versed were administered as conscious sedation during continuous cardiorespiratory monitoring by the radiology RN, with a total moderate sedation time of 16 minutes. MEDICATIONS: Lidocaine 1% subcutaneous CONTRAST:  1 ISOVUE-300 IOPAMIDOL (ISOVUE-300) INJECTION 61%, 1 ISOVUE-300 IOPAMIDOL (ISOVUE-300) INJECTION XX123456 COMPLICATIONS: None immediate FINDINGS: Right upper extremity venography demonstrates patency of right axillary and subclavian veins. There is a high-grade stenosis in the right brachiocephalic vein with right upper extremity drainage via thyrocervical and body wall collaterals. The collateral flow ultimately opacifies the left innominate vein, azygos arch, and SVC, shown to be patent to the right atrium. The right brachiocephalic vein stenosis responded well to the 12 mm balloon angioplasty. Follow-up venogram shows no significant residual or recurrent stenosis. No extravasation, dissection, or other apparent complication. No further filling of collateral channels. IMPRESSION: 1. High-grade right brachiocephalic vein stenosis, which likely accounted for the patient's right upper extremity swelling. 2. Good response of the lesion to 12 mm balloon angioplasty. Consider continued low level anticoagulation to prevent recurrence. Electronically Signed   By: Lucrezia Europe M.D.   On: 10/12/2016 11:18   Ir US Guide Vasc Access Right  Result Date: 10/12/2016 CLINICAL DATA:  Sickle cell disease. Right IJ port catheter placement 07/17/2016 due to poor peripheral venous access and recurrent venous access needs. Right arm swelling. Negative Doppler. EXAM: RIGHT EXTREMITY VENOGRAPHY; IR PTA VENOUS EXCEPT DIALYSIS CIRCUIT; IR ULTRASOUND GUIDANCE VASC ACCESS RIGHT PROCEDURE: The procedure, risks (including but not limited to bleeding, infection, organ damage ), benefits, and  alternatives were explained to the patient. Questions regarding the procedure were encouraged and answered. The patient understands and consents to the procedure. Right upper arm prepped with chlorhexidine, draped in usual sterile fashion. Under real-time ultrasound guidance, the right brachial vein was accessed with a micropuncture 21 gauge Set for right upper extremity venography. Ultrasound documentation was saved. Right venous access and surrounding brachial region prepped and draped in usual sterile fashion. Maximal barrier sterile technique was utilized including caps, mask, sterile gowns, sterile gloves, sterile drape, hand hygiene and skin antiseptic. The right venous access was exchanged over a Bentson wire for a 7 [place] vascular sheath, through which an angled 5 [place] angiographic catheter was advanced and negotiated across the right brachiocephalic venous occlusion using an angled Glidewire. 3000 units heparin given IV. The catheter was exchanged over an Amplatz wire for a a 12 mm x 4 cm atlas angioplasty balloon, used to dilate the is obstruction. After final venogram, the catheter was removed and hemostasis achieved at the site. The patient tolerated the procedure well. ANESTHESIA/SEDATION: Intravenous Fentanyl and Versed were administered as conscious sedation during continuous cardiorespiratory monitoring by the radiology RN, with a total moderate sedation time of 16 minutes. MEDICATIONS: Lidocaine 1% subcutaneous CONTRAST:  1 ISOVUE-300 IOPAMIDOL (ISOVUE-300) INJECTION 61%, 1 ISOVUE-300 IOPAMIDOL (ISOVUE-300) INJECTION XX123456 COMPLICATIONS: None immediate FINDINGS: Right upper extremity venography demonstrates patency of right axillary and subclavian veins. There is a high-grade stenosis in the right brachiocephalic vein with right upper extremity drainage via thyrocervical and body wall collaterals. The collateral flow ultimately opacifies the left innominate vein, azygos arch, and SVC, shown to be  patent to the right atrium. The right brachiocephalic vein  stenosis responded well to the 12 mm balloon angioplasty. Follow-up venogram shows no significant residual or recurrent stenosis. No extravasation, dissection, or other apparent complication. No further filling of collateral channels. IMPRESSION: 1. High-grade right brachiocephalic vein stenosis, which likely accounted for the patient's right upper extremity swelling. 2. Good response of the lesion to 12 mm balloon angioplasty. Consider continued low level anticoagulation to prevent recurrence. Electronically Signed   By: Lucrezia Europe M.D.   On: 10/12/2016 11:18   Ir Pta Venous Except Dialysis Circuit  Result Date: 10/12/2016 CLINICAL DATA:  Sickle cell disease. Right IJ port catheter placement 07/17/2016 due to poor peripheral venous access and recurrent venous access needs. Right arm swelling. Negative Doppler. EXAM: RIGHT EXTREMITY VENOGRAPHY; IR PTA VENOUS EXCEPT DIALYSIS CIRCUIT; IR ULTRASOUND GUIDANCE VASC ACCESS RIGHT PROCEDURE: The procedure, risks (including but not limited to bleeding, infection, organ damage ), benefits, and alternatives were explained to the patient. Questions regarding the procedure were encouraged and answered. The patient understands and consents to the procedure. Right upper arm prepped with chlorhexidine, draped in usual sterile fashion. Under real-time ultrasound guidance, the right brachial vein was accessed with a micropuncture 21 gauge Set for right upper extremity venography. Ultrasound documentation was saved. Right venous access and surrounding brachial region prepped and draped in usual sterile fashion. Maximal barrier sterile technique was utilized including caps, mask, sterile gowns, sterile gloves, sterile drape, hand hygiene and skin antiseptic. The right venous access was exchanged over a Bentson wire for a 7 [place] vascular sheath, through which an angled 5 [place] angiographic catheter was advanced and  negotiated across the right brachiocephalic venous occlusion using an angled Glidewire. 3000 units heparin given IV. The catheter was exchanged over an Amplatz wire for a a 12 mm x 4 cm atlas angioplasty balloon, used to dilate the is obstruction. After final venogram, the catheter was removed and hemostasis achieved at the site. The patient tolerated the procedure well. ANESTHESIA/SEDATION: Intravenous Fentanyl and Versed were administered as conscious sedation during continuous cardiorespiratory monitoring by the radiology RN, with a total moderate sedation time of 16 minutes. MEDICATIONS: Lidocaine 1% subcutaneous CONTRAST:  1 ISOVUE-300 IOPAMIDOL (ISOVUE-300) INJECTION 61%, 1 ISOVUE-300 IOPAMIDOL (ISOVUE-300) INJECTION XX123456 COMPLICATIONS: None immediate FINDINGS: Right upper extremity venography demonstrates patency of right axillary and subclavian veins. There is a high-grade stenosis in the right brachiocephalic vein with right upper extremity drainage via thyrocervical and body wall collaterals. The collateral flow ultimately opacifies the left innominate vein, azygos arch, and SVC, shown to be patent to the right atrium. The right brachiocephalic vein stenosis responded well to the 12 mm balloon angioplasty. Follow-up venogram shows no significant residual or recurrent stenosis. No extravasation, dissection, or other apparent complication. No further filling of collateral channels. IMPRESSION: 1. High-grade right brachiocephalic vein stenosis, which likely accounted for the patient's right upper extremity swelling. 2. Good response of the lesion to 12 mm balloon angioplasty. Consider continued low level anticoagulation to prevent recurrence. Electronically Signed   By: Lucrezia Europe M.D.   On: 10/12/2016 11:18     OTHER PROCEDURES: Venous Duplex Ultrasound RUE No evidence of deep vein or superficial thrombosis involving the right upper extremity, left subclavian vein, and left internal jugular  vein. - No changes since stidy of 10/06/2016.   No results found for this or any previous visit (from the past 240 hour(s)).  BRIEF ADMITTING H & P: Martie L Cobbis a 36 y.o.femalewith sickle cell anemia presents to the ER because of worsening lower extremity  pain and chest pain. Patient was recently admitted for similar complaints and improved and was discharged home. Patient states last 2-3 days pain has been worsening. Denies any productive cough fever chills headaches or any visual symptoms of focal deficits. Chest pain is mostly in the lower sternum. No associated shortness of breath. In the ER patient is found to be hypoxic. Chest x-ray was unremarkable. D-dimer is elevated. Patient is being admitted for sickle cell pain crisis and possible acute chest syndrome.   Hospital Course:   Present on Admission: . (Resolved) Sickle cell pain crisis ([place]) . (Resolved) Chest pain . (Resolved) Sickle cell crisis ([place]) . 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 She was managed with Dilaudid via PCA, Toradol and IVF. She had ongoing hemolysis which complicated the crisis and led to an acute hemolytic anemia requiring transfusion of 1 unit RBC's. Due to the acute on chronic anemia, her hydrea was held temporarily while hospitalized. However her bone marrow activity improved and Hydrea was resumed. At the time of discharge, her Hb was stable at 7.0 g/dL. Her hospital course was further complicated by swelling of RUE and RLE.  Which after investigation revealed SVC stenosis which required angioplasty with successful restoration of blood flow. The [facility] has recommended sub-therapeutic dose of Coumadin to maintain flow through the vessels and the port-o-cath. I have discussed this with her Primary MD- Dr. Doreene Burke and she is started on Coumadin 2.5 mg daily. She also developed thrombocytopenia (nadir 96)  and Lovenox used for VTE prophylaxis had to be held due to risk of bleeding. Her platelets recovered after Lovenox was discontinued. At the time of discharge, her platelets were 168. Pt also had a mild hypoxia but the resolved and at the time of discharge, he oxygen levels were 95-98% while ambulating on RA. Pt also had some electrolyte derangement involving Potassium and Magnesium both of which were replaced orally and by IV. She has been started on chronic supplementation of Potassium and Magnesium.    Disposition and Follow-up:  Pt is discharged home in good condition and is to follow up with her PMD in 3 days,   DISCHARGE EXAM:  General: Alert, awake, oriented x3, in no apparent distress. Well appearing. HEENT: [place]/AT PEERL, EOMI, mild icteris (baseline). Neck: Trachea midline, no masses, no thyromegal,y no JVD, no carotid bruit OROPHARYNX: Moist, No exudate/ erythema/lesions.  Heart: Regular rate and rhythm, without murmurs, rubs, gallops or S3. PMI non-displaced. Exam reveals no decreased pulses. Pulmonary/Chest: Normal effort. Breath sounds normal. No. Apnea. Clear to auscultation,no stridor, no wheezing and no rhonchi noted. No respiratory distress and no tenderness noted. Abdomen: Soft, nontender, nondistended, normal bowel sounds, no masses no hepatosplenomegaly noted. No fluid wave and no ascites. There is no guarding or rebound. Neuro: Alert and oriented to person, place and time. Normal motor skills, Displays no atrophy or tremors and exhibits normal muscle tone.  No focal neurological deficits noted cranial nerves II through XII grossly intact. No sensory deficit noted. Strength at baseline in bilateral upper and lower extremities. Gait normal. Musculoskeletal: No warmth swelling or erythema around joints, no spinal tenderness noted. Swelling of RUE and RLE resolved. Psychiatric: Patient alert and oriented x3, good insight and cognition, good recent to remote recall. Mood, memory,  affect and judgement normal Lymph node survey: No cervical axillary or inguinal lymphadenopathy noted. Skin: Skin is warm and dry. No bruising, no ecchymosis and  no rash noted. Pt is not diaphoretic. No erythema. No pallor  Blood pressure (!) 142/82, pulse 100, temperature 99.9 F (37.7 C), temperature source Oral, resp. rate 17, height 5\' 3"  (1.6 m), weight 73 kg (161 lb), SpO2 95 %.   Recent Labs  10/13/16 1245 10/15/16 1100  NA 137 136  K 3.3* 3.3*  CL 106 105  CO2 26 26  GLUCOSE 120* 122*  BUN 5* <5*  CREATININE 0.45 0.48  CALCIUM 7.9* 8.0*  MG  --  1.6*    Recent Labs  10/13/16 1245  AST 93*  ALT 26  ALKPHOS 108  BILITOT 12.5*  PROT 6.5  ALBUMIN 2.1*   No results for input(s): LIPASE, AMYLASE in the last 72 hours.  Recent Labs  10/13/16 1245 10/15/16 1100  WBC 10.1 9.7  NEUTROABS 5.3 5.6  HGB 6.7* 7.0*  HCT 18.5* 18.9*  MCV 93.4 95.5  PLT 134* 168     Total time spent including face to face and decision making was greater than 30 minutes  Signed: Romanita Fager A. 10/15/2016, 3:06 PM

## 2016-10-15 NOTE — Progress Notes (Signed)
RN paged attending MD, Dr. Zigmund Daniel and informed of new onset of patient symptoms.  Patient just had adiaporetic episode, and stated she felt like she was about to pass out.    At this time, RN assisted patients legs back into bed, as she was sitting on the side of the bed. RN checked pulse, and breathing at this time because patients eyes rolled back into her head. Patient would not respond verbally when I was calling her name for approximately 3 minutes.   RN then obtained a sets vital signs at 1600, 1605, and 1610. All of which are charted in the patient record.   Dr. Zigmund Daniel was informed of all of the above events, and came to assess patient. See her note for interventions that were made.   Patient aware of event, and reason for further monitoring.

## 2016-10-15 NOTE — Progress Notes (Signed)
Assumed care of patient. Agree with previous Nurse assessment. Seizure pads ordered from portable equipment. Cardiac monitoring initiated. No signes of distress at this time.  Barbee Shropshire. Brigitte Pulse, RN

## 2016-10-16 ENCOUNTER — Inpatient Hospital Stay (HOSPITAL_COMMUNITY)
Admit: 2016-10-16 | Discharge: 2016-10-16 | Disposition: A | Discharge status: Home / Self Care | Payer: Medicare Other | Attending: Internal Medicine | Admitting: Internal Medicine

## 2016-10-16 ENCOUNTER — Inpatient Hospital Stay (HOSPITAL_COMMUNITY): Payer: Medicare Other

## 2016-10-16 ENCOUNTER — Other Ambulatory Visit (HOSPITAL_COMMUNITY): Payer: Self-pay | Admitting: Radiology

## 2016-10-16 DIAGNOSIS — R4189 Other symptoms and signs involving cognitive functions and awareness: Secondary | ICD-10-CM

## 2016-10-16 DIAGNOSIS — D571 Sickle-cell disease without crisis: Secondary | ICD-10-CM

## 2016-10-16 DIAGNOSIS — R42 Dizziness and giddiness: Secondary | ICD-10-CM

## 2016-10-16 LAB — CBC WITH DIFFERENTIAL/PLATELET
Basophils Absolute: 0 10*3/uL (ref 0.0–0.1)
Basophils Relative: 0 %
EOS PCT: 2 %
Eosinophils Absolute: 0.2 10*3/uL (ref 0.0–0.7)
HEMATOCRIT: 18.9 % — AB (ref 36.0–46.0)
Hemoglobin: 6.8 g/dL — CL (ref 12.0–15.0)
LYMPHS PCT: 23 %
Lymphs Abs: 2.3 10*3/uL (ref 0.7–4.0)
MCH: 34.5 pg — AB (ref 26.0–34.0)
MCHC: 36 g/dL (ref 30.0–36.0)
MCV: 95.9 fL (ref 78.0–100.0)
MONO ABS: 1.5 10*3/uL — AB (ref 0.1–1.0)
Monocytes Relative: 14 %
Neutro Abs: 6.2 10*3/uL (ref 1.7–7.7)
Neutrophils Relative %: 61 %
Platelets: 185 10*3/uL (ref 150–400)
RBC: 1.97 MIL/uL — ABNORMAL LOW (ref 3.87–5.11)
RDW: 23.2 % — AB (ref 11.5–15.5)
WBC: 10.3 10*3/uL (ref 4.0–10.5)

## 2016-10-16 LAB — RETICULOCYTES
RBC.: 1.97 MIL/uL — ABNORMAL LOW (ref 3.87–5.11)
Retic Count, Absolute: 405.8 10*3/uL — ABNORMAL HIGH (ref 19.0–186.0)
Retic Ct Pct: 20.6 % — ABNORMAL HIGH (ref 0.4–3.1)

## 2016-10-16 LAB — MAGNESIUM: Magnesium: 1.9 mg/dL (ref 1.7–2.4)

## 2016-10-16 MED ORDER — ASPIRIN EC 81 MG PO TBEC
81.0000 mg | DELAYED_RELEASE_TABLET | Freq: Every day | ORAL | Status: DC
  Administered 2016-10-16 – 2016-10-17 (×2): 81 mg via ORAL
  Filled 2016-10-16 (×2): qty 1

## 2016-10-16 NOTE — Care Management Important Message (Signed)
Important Message  Patient Details  Name: NAELLE KESLING MRN: [id number] Date of Birth: January 06, 1981   Medicare Important Message Given:  Yes    Kerin Salen 10/16/2016, 1:25 PMImportant Message  Patient Details  Name: AZRA PANTEL MRN: [id number] Date of Birth: [birth year]   Medicare Important Message Given:  Yes    Kerin Salen 10/16/2016, 1:25 PM

## 2016-10-16 NOTE — Progress Notes (Signed)
CRITICAL VALUE ALERT  Critical value received:  Hgb 6.8  Date of notification:  10/16/16  Time of notification:  04:59  Critical value read back:Yes.    Nurse who received alert:  Ruben Im, RN  MD notified (1st page):  Baltazar Najjar, NP     Time of first page:  05:10  MD notified (2nd page):  Time of second page:  Responding MD:    Time MD responded:  NP notified via text page; no return call. Will continue to monitor patient.

## 2016-10-16 NOTE — Procedures (Signed)
ELECTROENCEPHALOGRAM REPORT  Date of Study: 10/16/2016  Patient's Name: Dawn [name] MRN: [id number] Date of Birth: 05-Dec-1980  Referring Provider: Sharyn Lull A. Zigmund Daniel, MD  Clinical History: 36 year old female admitted for sickle cell crisis had episode of unresponsiveness.  Medications: diphenhydrAMINE (BENADRYL) capsule 25 mg  folic acid (FOLVITE) tablet 1 mg  HYDROmorphone (DILAUDID) injection 1 mg  hydroxyurea (HYDREA) capsule 1,000 mg  loratadine (CLARITIN) tablet 10 mg  methocarbamol (ROBAXIN) tablet 500 mg  morphine (MS CONTIN) 12 hr tablet 15 mg  multivitamin with minerals tablet 1 tablet  oxyCODONE (Oxy IR/ROXICODONE) immediate release tablet 15 mg  polyethylene glycol (MIRALAX / GLYCOLAX) packet 17 gpotassium chloride SA (K-DUR,KLOR-CON) CR tablet 40 mEq  promethazine (PHENERGAN) tablet 12.5 mg  Technical Summary: A multichannel digital EEG recording measured by the international 10-20 system with electrodes applied with paste and impedances below 5000 ohms performed in our laboratory with EKG monitoring in an awake and drowsy patient.  Hyperventilation was not performed.  Photic stimulation was performed.  The digital EEG was referentially recorded, reformatted, and digitally filtered in a variety of bipolar and referential montages for optimal display.    Description: The patient is awake and drowsy during the recording.  During maximal wakefulness, there is a symmetric medium to high voltage 10 Hz posterior dominant rhythm that attenuates with eye opening.  The record is symmetric.  During drowsiness, there is an increase in theta slowing of the background.  Stage 2 sleep was not seen.  Photic stimulation did not elicit any abnormalities.  There were no epileptiform discharges or electrographic seizures seen.    EKG lead was unremarkable.  Impression: This awake and drowsy EEG is normal.    Clinical Correlation: A normal EEG does not exclude a clinical diagnosis of  epilepsy.  If further clinical questions remain, prolonged EEG may be helpful.  Clinical correlation is advised.  Metta Clines, DO

## 2016-10-16 NOTE — Progress Notes (Addendum)
SICKLE CELL SERVICE PROGRESS NOTE  Dawn [name] [id number] DOB: 01-20-1981 DOA: 10/02/2016 PCP: Angelica Chessman, MD  Assessment/Plan: Active Problems:   SVC obstruction   Hypomagnesemia   Swelling of right upper extremity  1. Syncope: CT head and EEG both negative. ECHO pending. No AVB or ectopy on telemetry. No further work-up needed in patient. Will speak with Neurology about f/u as out patient.  2. Dizziness: Pt describes dizziness during ambulation and the feeling that the room is spinning. Orthostatic vital signs negative. Will check MRA to evaluate cerebral vasculature. 3. Hypomagnesemia and Hypokalemia: Resolved with replacement. 4. S/P SVC  Angioplasty welling in RUE resolved after angioplasty.  5. Hb SS without crisis:  Continue scheduled Oxycodone at 15 mg every 4 hours and MS Contin.   6. Anemia of Chronic Disease: Hb at baseline of 6.8 g/dL with good reticulocytosis.   Continue Hydrea. 7. Chronic pain: Continue MS Contin   Code Status: Full Code Family Communication: N/A Disposition Plan: Anticipate discharge home tomorrow.  [name],[name]  Pager [phone]. If 7PM-7AM, please contact night-coverage.  10/16/2016, 4:23 PM  LOS: 13 days   Interim History: Pt reports dizziness on ambulation. Oxygen levels normal with ambulation. No further syncopal or near-syncopal episodes.   Consultants:  None  Procedures:  None  Antibiotics:  None   Objective: Vitals:   10/16/16 0839 10/16/16 0840 10/16/16 1024 10/16/16 1400  BP:  137/77 132/81 125/74  Pulse:  93 88 91  Resp:  16 17 18   Temp:  99.6 F (37.6 C) 98.7 F (37.1 C) 98.6 F (37 C)  TempSrc:  Oral Oral Oral  SpO2: 90% 94% 92% 99%  Weight:      Height:       Weight change:   Intake/Output Summary (Last 24 hours) at 10/16/16 1623 Last data filed at 10/16/16 1400  Gross per 24 hour  Intake              350 ml  Output                0 ml  Net              350 ml    General: Alert, awake,  oriented x3, in no apparent  distress.  HEENT: [place]/AT PEERL, EOMI, mild icterus at baseline.  Neck: Trachea midline,  no masses, no thyromegal,y no JVD, no carotid bruit Heart: Regular rate and rhythm, without murmurs, rubs, gallops, PMI non-displaced, no heaves or thrills on palpation.  Lungs: Clear to auscultation, no wheezing or rhonchi noted. No increased vocal fremitus resonant to percussion.   Abdomen:Obese, soft, nontender, nondistended, positive bowel sounds, no masses no hepatosplenomegaly noted.  Neuro: No focal neurological deficits noted cranial nerves II through XII grossly intact. Strength at functional baseline in bilateral upper and lower extremities. Musculoskeletal: No warmth swelling or erythema around joints, no spinal tenderness noted.     Data Reviewed: Basic Metabolic Panel:  Recent Labs Lab 10/10/16 0402 10/10/16 0411 10/13/16 1245 10/15/16 1100 10/15/16 1946 10/16/16 0400  NA  --  139 137 136 136  --   K  --  3.4* 3.3* 3.3* 4.3  --   CL  --  107 106 105 103  --   CO2  --  26 26 26 27   --   GLUCOSE  --  90 120* 122* 127*  --   BUN  --  <5* 5* <5* 6  --   CREATININE  --  0.32* 0.45 0.48 <  0.30*  --   CALCIUM  --  8.4* 7.9* 8.0* 8.2*  --   MG 1.8  --   --  1.6*  --  1.9   Liver Function Tests:  Recent Labs Lab 10/13/16 1245  AST 93*  ALT 26  ALKPHOS 108  BILITOT 12.5*  PROT 6.5  ALBUMIN 2.1*   No results for input(s): LIPASE, AMYLASE in the last 168 hours. No results for input(s): AMMONIA in the last 168 hours. CBC:  Recent Labs Lab 10/10/16 0411 10/11/16 1000 10/13/16 1245 10/15/16 1100 10/16/16 0400  WBC 10.2 9.0 10.1 9.7 10.3  NEUTROABS 6.1 4.9 5.3 5.6 6.2  HGB 6.6* 6.7* 6.7* 7.0* 6.8*  HCT 18.1* 17.8* 18.5* 18.9* 18.9*  MCV 92.8 92.7 93.4 95.5 95.9  PLT 114* 120* 134* 168 185   Cardiac Enzymes: No results for input(s): CKTOTAL, CKMB, CKMBINDEX, TROPONINI in the last 168 hours. BNP (last 3 results) No results for input(s): BNP in  the last 8760 hours.  ProBNP (last 3 results) No results for input(s): PROBNP in the last 8760 hours.  CBG: No results for input(s): GLUCAP in the last 168 hours.  No results found for this or any previous visit (from the past 240 hour(s)).   Studies: Dg Chest 2 View  Result Date: 10/02/2016 CLINICAL DATA:  Sickle-cell crisis.  Short of breath. EXAM: CHEST  2 VIEW COMPARISON:  09/30/2016 FINDINGS: Mild cardiac enlargement. Lungs are clear without infiltrate effusion or edema. Port-A-Cath tip in the lower SVC unchanged. No change from the prior study. IMPRESSION: No active cardiopulmonary disease. Electronically Signed   By: Franchot Gallo M.D.   On: 10/02/2016 19:09   Dg Chest 2 View  Result Date: 09/30/2016 CLINICAL DATA:  36 year old with current history of sickle cell disease, presenting with 2 week history of intermittent chest pain which acutely worsened today. EXAM: CHEST  2 VIEW COMPARISON:  08/03/2016, 04/28/2016 and earlier, including CTA chest 08/25/2015. FINDINGS: Cardiac silhouette upper normal in size to slightly enlarged, unchanged. Hilar and mediastinal contours otherwise unremarkable. Right jugular Port-A-Cath tip remains in the lower SVC, unchanged. Lungs clear. Bronchovascular markings normal. Pulmonary vascularity normal. No visible pleural effusions. No pneumothorax. Visualized bony thorax intact. IMPRESSION: Stable borderline heart size.  No acute cardiopulmonary disease. Electronically Signed   By: Evangeline Dakin M.D.   On: 09/30/2016 18:01   Ct Head Wo Contrast  Result Date: 10/15/2016 CLINICAL DATA:  Recent syncopal episode EXAM: CT HEAD WITHOUT CONTRAST TECHNIQUE: Contiguous axial images were obtained from the base of the skull through the vertex without intravenous contrast. COMPARISON:  None. FINDINGS: Brain: No evidence of acute infarction, hemorrhage, hydrocephalus, extra-axial collection or mass lesion/mass effect. Vascular: No hyperdense vessel or unexpected  calcification. Skull: Normal. Negative for fracture or focal lesion. Sinuses/Orbits: No acute finding. Other: None. IMPRESSION: No acute intracranial abnormality noted. Electronically Signed   By: Inez Catalina M.D.   On: 10/15/2016 18:49   Ct Angio Chest Pe W Or Wo Contrast  Result Date: 10/03/2016 CLINICAL DATA:  Sickle cell anemia, worsening lower extremity and chest pain EXAM: CT ANGIOGRAPHY CHEST WITH CONTRAST TECHNIQUE: Multidetector CT imaging of the chest was performed using the standard protocol during bolus administration of intravenous contrast. Multiplanar CT image reconstructions and MIPs were obtained to evaluate the vascular anatomy. CONTRAST:  100 cc Isovue COMPARISON:  08/25/2015 FINDINGS: Cardiovascular: Borderline cardiomegaly. No pericardial effusion. There is right IJ Port-A-Cath with tip in SVC right atrium junction. No pulmonary embolus No aortic aneurysm of  aortic dissection. Mediastinum/Nodes: No mediastinal hematoma or adenopathy. Central airway is patent. Lungs/Pleura: Mild central venous congestion. Images of the lung parenchyma shows no infiltrate or pulmonary edema. No focal consolidation. Mild dependent atelectasis noted bilateral lower lobe posteriorly. There is no pneumothorax. No bronchiectasis. No emphysema. Upper Abdomen: The visualized upper abdomen shows status post cholecystectomy. No adrenal gland mass. There is nodular contour of the liver consistent with cirrhosis. Lobulated contour bilateral kidney. Musculoskeletal: Stable sclerotic bony changes within thoracic spine and sternum consistent with history of sickle cell anemia. No bone destruction or acute fracture. Stable sclerotic changes bilateral proximal humerus. Review of the MIP images confirms the above findings. IMPRESSION: 1. No pulmonary embolus is noted. 2. No mediastinal hematoma or adenopathy. 3. Borderline cardiomegaly.  No pericardial effusion. 4. Central mild venous congestion without pulmonary edema. No  segmental consolidation. Mild dependent atelectasis bilateral lower lobe posteriorly. No pneumothorax. 5. Stable chronic sclerotic bony changes. 6. Again noted nodular contour of the liver consistent with cirrhosis. Status post cholecystectomy. Electronically Signed   By: Lahoma Crocker M.D.   On: 10/03/2016 08:40   Ir Veno/ext/uni Right  Result Date: 10/12/2016 CLINICAL DATA:  Sickle cell disease. Right IJ port catheter placement 07/17/2016 due to poor peripheral venous access and recurrent venous access needs. Right arm swelling. Negative Doppler. EXAM: RIGHT EXTREMITY VENOGRAPHY; IR PTA VENOUS EXCEPT DIALYSIS CIRCUIT; IR ULTRASOUND GUIDANCE VASC ACCESS RIGHT PROCEDURE: The procedure, risks (including but not limited to bleeding, infection, organ damage ), benefits, and alternatives were explained to the patient. Questions regarding the procedure were encouraged and answered. The patient understands and consents to the procedure. Right upper arm prepped with chlorhexidine, draped in usual sterile fashion. Under real-time ultrasound guidance, the right brachial vein was accessed with a micropuncture 21 gauge Set for right upper extremity venography. Ultrasound documentation was saved. Right venous access and surrounding brachial region prepped and draped in usual sterile fashion. Maximal barrier sterile technique was utilized including caps, mask, sterile gowns, sterile gloves, sterile drape, hand hygiene and skin antiseptic. The right venous access was exchanged over a Bentson wire for a 7 [place] vascular sheath, through which an angled 5 [place] angiographic catheter was advanced and negotiated across the right brachiocephalic venous occlusion using an angled Glidewire. 3000 units heparin given IV. The catheter was exchanged over an Amplatz wire for a a 12 mm x 4 cm atlas angioplasty balloon, used to dilate the is obstruction. After final venogram, the catheter was removed and hemostasis achieved at the site. The  patient tolerated the procedure well. ANESTHESIA/SEDATION: Intravenous Fentanyl and Versed were administered as conscious sedation during continuous cardiorespiratory monitoring by the radiology RN, with a total moderate sedation time of 16 minutes. MEDICATIONS: Lidocaine 1% subcutaneous CONTRAST:  1 ISOVUE-300 IOPAMIDOL (ISOVUE-300) INJECTION 61%, 1 ISOVUE-300 IOPAMIDOL (ISOVUE-300) INJECTION XX123456 COMPLICATIONS: None immediate FINDINGS: Right upper extremity venography demonstrates patency of right axillary and subclavian veins. There is a high-grade stenosis in the right brachiocephalic vein with right upper extremity drainage via thyrocervical and body wall collaterals. The collateral flow ultimately opacifies the left innominate vein, azygos arch, and SVC, shown to be patent to the right atrium. The right brachiocephalic vein stenosis responded well to the 12 mm balloon angioplasty. Follow-up venogram shows no significant residual or recurrent stenosis. No extravasation, dissection, or other apparent complication. No further filling of collateral channels. IMPRESSION: 1. High-grade right brachiocephalic vein stenosis, which likely accounted for the patient's right upper extremity swelling. 2. Good response of the lesion to 12  mm balloon angioplasty. Consider continued low level anticoagulation to prevent recurrence. Electronically Signed   By: Lucrezia Europe M.D.   On: 10/12/2016 11:18   Ir US Guide Vasc Access Right  Result Date: 10/12/2016 CLINICAL DATA:  Sickle cell disease. Right IJ port catheter placement 07/17/2016 due to poor peripheral venous access and recurrent venous access needs. Right arm swelling. Negative Doppler. EXAM: RIGHT EXTREMITY VENOGRAPHY; IR PTA VENOUS EXCEPT DIALYSIS CIRCUIT; IR ULTRASOUND GUIDANCE VASC ACCESS RIGHT PROCEDURE: The procedure, risks (including but not limited to bleeding, infection, organ damage ), benefits, and alternatives were explained to the patient. Questions  regarding the procedure were encouraged and answered. The patient understands and consents to the procedure. Right upper arm prepped with chlorhexidine, draped in usual sterile fashion. Under real-time ultrasound guidance, the right brachial vein was accessed with a micropuncture 21 gauge Set for right upper extremity venography. Ultrasound documentation was saved. Right venous access and surrounding brachial region prepped and draped in usual sterile fashion. Maximal barrier sterile technique was utilized including caps, mask, sterile gowns, sterile gloves, sterile drape, hand hygiene and skin antiseptic. The right venous access was exchanged over a Bentson wire for a 7 [place] vascular sheath, through which an angled 5 [place] angiographic catheter was advanced and negotiated across the right brachiocephalic venous occlusion using an angled Glidewire. 3000 units heparin given IV. The catheter was exchanged over an Amplatz wire for a a 12 mm x 4 cm atlas angioplasty balloon, used to dilate the is obstruction. After final venogram, the catheter was removed and hemostasis achieved at the site. The patient tolerated the procedure well. ANESTHESIA/SEDATION: Intravenous Fentanyl and Versed were administered as conscious sedation during continuous cardiorespiratory monitoring by the radiology RN, with a total moderate sedation time of 16 minutes. MEDICATIONS: Lidocaine 1% subcutaneous CONTRAST:  1 ISOVUE-300 IOPAMIDOL (ISOVUE-300) INJECTION 61%, 1 ISOVUE-300 IOPAMIDOL (ISOVUE-300) INJECTION XX123456 COMPLICATIONS: None immediate FINDINGS: Right upper extremity venography demonstrates patency of right axillary and subclavian veins. There is a high-grade stenosis in the right brachiocephalic vein with right upper extremity drainage via thyrocervical and body wall collaterals. The collateral flow ultimately opacifies the left innominate vein, azygos arch, and SVC, shown to be patent to the right atrium. The right brachiocephalic  vein stenosis responded well to the 12 mm balloon angioplasty. Follow-up venogram shows no significant residual or recurrent stenosis. No extravasation, dissection, or other apparent complication. No further filling of collateral channels. IMPRESSION: 1. High-grade right brachiocephalic vein stenosis, which likely accounted for the patient's right upper extremity swelling. 2. Good response of the lesion to 12 mm balloon angioplasty. Consider continued low level anticoagulation to prevent recurrence. Electronically Signed   By: Lucrezia Europe M.D.   On: 10/12/2016 11:18   Ir Pta Venous Except Dialysis Circuit  Result Date: 10/12/2016 CLINICAL DATA:  Sickle cell disease. Right IJ port catheter placement 07/17/2016 due to poor peripheral venous access and recurrent venous access needs. Right arm swelling. Negative Doppler. EXAM: RIGHT EXTREMITY VENOGRAPHY; IR PTA VENOUS EXCEPT DIALYSIS CIRCUIT; IR ULTRASOUND GUIDANCE VASC ACCESS RIGHT PROCEDURE: The procedure, risks (including but not limited to bleeding, infection, organ damage ), benefits, and alternatives were explained to the patient. Questions regarding the procedure were encouraged and answered. The patient understands and consents to the procedure. Right upper arm prepped with chlorhexidine, draped in usual sterile fashion. Under real-time ultrasound guidance, the right brachial vein was accessed with a micropuncture 21 gauge Set for right upper extremity venography. Ultrasound documentation was saved. Right venous access and  surrounding brachial region prepped and draped in usual sterile fashion. Maximal barrier sterile technique was utilized including caps, mask, sterile gowns, sterile gloves, sterile drape, hand hygiene and skin antiseptic. The right venous access was exchanged over a Bentson wire for a 7 [place] vascular sheath, through which an angled 5 [place] angiographic catheter was advanced and negotiated across the right brachiocephalic venous occlusion  using an angled Glidewire. 3000 units heparin given IV. The catheter was exchanged over an Amplatz wire for a a 12 mm x 4 cm atlas angioplasty balloon, used to dilate the is obstruction. After final venogram, the catheter was removed and hemostasis achieved at the site. The patient tolerated the procedure well. ANESTHESIA/SEDATION: Intravenous Fentanyl and Versed were administered as conscious sedation during continuous cardiorespiratory monitoring by the radiology RN, with a total moderate sedation time of 16 minutes. MEDICATIONS: Lidocaine 1% subcutaneous CONTRAST:  1 ISOVUE-300 IOPAMIDOL (ISOVUE-300) INJECTION 61%, 1 ISOVUE-300 IOPAMIDOL (ISOVUE-300) INJECTION XX123456 COMPLICATIONS: None immediate FINDINGS: Right upper extremity venography demonstrates patency of right axillary and subclavian veins. There is a high-grade stenosis in the right brachiocephalic vein with right upper extremity drainage via thyrocervical and body wall collaterals. The collateral flow ultimately opacifies the left innominate vein, azygos arch, and SVC, shown to be patent to the right atrium. The right brachiocephalic vein stenosis responded well to the 12 mm balloon angioplasty. Follow-up venogram shows no significant residual or recurrent stenosis. No extravasation, dissection, or other apparent complication. No further filling of collateral channels. IMPRESSION: 1. High-grade right brachiocephalic vein stenosis, which likely accounted for the patient's right upper extremity swelling. 2. Good response of the lesion to 12 mm balloon angioplasty. Consider continued low level anticoagulation to prevent recurrence. Electronically Signed   By: Lucrezia Europe M.D.   On: 10/12/2016 11:18    Scheduled Meds: . folic acid  1 mg Oral Daily  . hydroxyurea  1,000 mg Oral Daily  . loratadine  10 mg Oral Daily  . morphine  15 mg Oral Q12H  . multivitamin with minerals  1 tablet Oral Daily  . oxyCODONE  15 mg Oral Q4H  . potassium chloride  40 mEq  Oral BID  . senna-docusate  1 tablet Oral BID  . Vitamin D (Ergocalciferol)  50,000 Units Oral Q7 days   Continuous Infusions: . dextrose 5 % and 0.45% NaCl Stopped (10/16/16 1400)    Active Problems:   SVC obstruction   Hypomagnesemia   Swelling of right upper extremity     In excess of 25 minutes spent during this consult. Greater than 50% involved face to face contact with the patient for assessment, counseling and coordination of care.

## 2016-10-16 NOTE — Progress Notes (Signed)
EEG completed, results pending. 

## 2016-10-17 ENCOUNTER — Inpatient Hospital Stay (HOSPITAL_COMMUNITY): Payer: Medicare Other

## 2016-10-17 DIAGNOSIS — R55 Syncope and collapse: Secondary | ICD-10-CM | POA: Diagnosis not present

## 2016-10-17 LAB — ECHOCARDIOGRAM COMPLETE
HEIGHTINCHES: 63 in
WEIGHTICAEL: 2576 [oz_av]

## 2016-10-17 MED ORDER — HEPARIN SOD (PORK) LOCK FLUSH 100 UNIT/ML IV SOLN
500.0000 [IU] | INTRAVENOUS | Status: AC | PRN
  Administered 2016-10-17: 500 [IU]

## 2016-10-17 MED ORDER — ASPIRIN 81 MG PO TBEC
81.0000 mg | DELAYED_RELEASE_TABLET | Freq: Every day | ORAL | 0 refills | Status: AC
Start: 2016-10-18 — End: ?

## 2016-10-17 NOTE — Progress Notes (Signed)
Completed D/C teaching with patient. Discussed medications. Patient will be D/C home with family in stable condition.

## 2016-10-17 NOTE — Progress Notes (Signed)
  Echocardiogram 2D Echocardiogram has been performed.  Dawn [name] 10/17/2016, 12:05 PM

## 2016-10-17 NOTE — Discharge Summary (Signed)
Dawn [name] MRN: [id number] DOB/AGE: Jan 24, 1981 36 y.o.  Admit date: 10/02/2016 Discharge date: 10/17/2016  Primary Care Physician:  Angelica Chessman, MD   Discharge Diagnoses:   Patient Active Problem List   Diagnosis Date Noted  . Syncope 10/17/2016  . Dizziness   . Hypomagnesemia   . Swelling of right upper extremity   . 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 Positive D dimer   . Pulmonary hypertension 02/16/2016  . 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 Anemia of chronic disease   . Chronic pain syndrome 10/14/2015  . 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 Vitamin B12 deficiency 07/07/2015  . Thrombocytopenia ([place]) 07/06/2015  . Hyperbilirubinemia 09/11/2014  . Macrocytosis 06/13/2014  . Vitamin D deficiency 11/23/2013  . 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 Hypokalemia 03/28/2012  . 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 Elevated LFTs 04/27/2011  . Sickle cell disease ([place]) 04/26/2011    DISCHARGE MEDICATION: Allergies as of 10/17/2016      Reactions   Desferal [deferoxamine] Hives   Local reaction on arm only during infusion. Can take with benadryl    Latex Other (See Comments)   REACTION: Pt experiences a burning sensation on contacted skin areas   Lisinopril Other (See Comments), Cough   REACTION: Sore/scratchy throat   Tape Other (See Comments)   REACTION: Pt. Experiences a burning sensation on contacted skin areas   Tylenol [acetaminophen] Other (See Comments)   Pt. Does not take the following due to protection of kidney health. Past occurrence of Protein in urine.      Medication List    TAKE these medications   aspirin 81 MG EC tablet Take 1 tablet (81 mg total) by mouth daily. Start taking on:  0000000   folic acid 1 MG tablet Commonly known as:  FOLVITE Take 1 tablet (1 mg total) by mouth daily.   hydroxyurea 500 MG capsule Commonly known as:  HYDREA Take 2 capsules (1,000  mg total) by mouth daily. May take with food to minimize GI side effects.   levonorgestrel 20 MCG/24HR IUD Commonly known as:  MIRENA 1 each by Intrauterine route once.   loratadine 10 MG tablet Commonly known as:  CLARITIN Take 10 mg by mouth daily.   magnesium oxide 400 (241.3 Mg) MG tablet Commonly known as:  MAG-OX Take 1 tablet (400 mg total) by mouth daily.   morphine 15 MG 12 hr tablet Commonly known as:  MS CONTIN Take 15 mg by mouth every 12 (twelve) hours.   multivitamin with minerals Tabs tablet Take 1 tablet by mouth daily.   Oxycodone HCl 10 MG Tabs Take 1 tablet (10 mg total) by mouth every 6 (six) hours as needed.   potassium chloride SA 20 MEQ tablet Commonly known as:  K-DUR,KLOR-CON Take 2 tablets (40 mEq total) by mouth daily.   promethazine 12.5 MG tablet Commonly known as:  PHENERGAN Take 1 tablet (12.5 mg total) by mouth every 6 (six) hours as needed for nausea or vomiting.   Vitamin D (Ergocalciferol) 50000 units Caps capsule Commonly known as:  DRISDOL Take 1 capsule (50,000 Units total) by mouth every 7 (seven) days. Takes on Sundays.         Consults:    SIGNIFICANT DIAGNOSTIC STUDIES:  Dg Chest 2 View  Result Date: 10/02/2016 CLINICAL DATA:  Sickle-cell crisis.  Short of breath. EXAM: CHEST  2 VIEW COMPARISON:  09/30/2016 FINDINGS: Mild cardiac enlargement. Lungs are clear without infiltrate effusion  or edema. Port-A-Cath tip in the lower SVC unchanged. No change from the prior study. IMPRESSION: No active cardiopulmonary disease. Electronically Signed   By: Franchot Gallo M.D.   On: 10/02/2016 19:09   Dg Chest 2 View  Result Date: 09/30/2016 CLINICAL DATA:  36 year old with current history of sickle cell disease, presenting with 2 week history of intermittent chest pain which acutely worsened today. EXAM: CHEST  2 VIEW COMPARISON:  08/03/2016, 04/28/2016 and earlier, including CTA chest 08/25/2015. FINDINGS: Cardiac silhouette upper normal  in size to slightly enlarged, unchanged. Hilar and mediastinal contours otherwise unremarkable. Right jugular Port-A-Cath tip remains in the lower SVC, unchanged. Lungs clear. Bronchovascular markings normal. Pulmonary vascularity normal. No visible pleural effusions. No pneumothorax. Visualized bony thorax intact. IMPRESSION: Stable borderline heart size.  No acute cardiopulmonary disease. Electronically Signed   By: Evangeline Dakin M.D.   On: 09/30/2016 18:01   Ct Head Wo Contrast  Result Date: 10/15/2016 CLINICAL DATA:  Recent syncopal episode EXAM: CT HEAD WITHOUT CONTRAST TECHNIQUE: Contiguous axial images were obtained from the base of the skull through the vertex without intravenous contrast. COMPARISON:  None. FINDINGS: Brain: No evidence of acute infarction, hemorrhage, hydrocephalus, extra-axial collection or mass lesion/mass effect. Vascular: No hyperdense vessel or unexpected calcification. Skull: Normal. Negative for fracture or focal lesion. Sinuses/Orbits: No acute finding. Other: None. IMPRESSION: No acute intracranial abnormality noted. Electronically Signed   By: Inez Catalina M.D.   On: 10/15/2016 18:49   Ct Angio Chest Pe W Or Wo Contrast  Result Date: 10/03/2016 CLINICAL DATA:  Sickle cell anemia, worsening lower extremity and chest pain EXAM: CT ANGIOGRAPHY CHEST WITH CONTRAST TECHNIQUE: Multidetector CT imaging of the chest was performed using the standard protocol during bolus administration of intravenous contrast. Multiplanar CT image reconstructions and MIPs were obtained to evaluate the vascular anatomy. CONTRAST:  100 cc Isovue COMPARISON:  08/25/2015 FINDINGS: Cardiovascular: Borderline cardiomegaly. No pericardial effusion. There is right IJ Port-A-Cath with tip in SVC right atrium junction. No pulmonary embolus No aortic aneurysm of aortic dissection. Mediastinum/Nodes: No mediastinal hematoma or adenopathy. Central airway is patent. Lungs/Pleura: Mild central venous  congestion. Images of the lung parenchyma shows no infiltrate or pulmonary edema. No focal consolidation. Mild dependent atelectasis noted bilateral lower lobe posteriorly. There is no pneumothorax. No bronchiectasis. No emphysema. Upper Abdomen: The visualized upper abdomen shows status post cholecystectomy. No adrenal gland mass. There is nodular contour of the liver consistent with cirrhosis. Lobulated contour bilateral kidney. Musculoskeletal: Stable sclerotic bony changes within thoracic spine and sternum consistent with history of sickle cell anemia. No bone destruction or acute fracture. Stable sclerotic changes bilateral proximal humerus. Review of the MIP images confirms the above findings. IMPRESSION: 1. No pulmonary embolus is noted. 2. No mediastinal hematoma or adenopathy. 3. Borderline cardiomegaly.  No pericardial effusion. 4. Central mild venous congestion without pulmonary edema. No segmental consolidation. Mild dependent atelectasis bilateral lower lobe posteriorly. No pneumothorax. 5. Stable chronic sclerotic bony changes. 6. Again noted nodular contour of the liver consistent with cirrhosis. Status post cholecystectomy. Electronically Signed   By: Lahoma Crocker M.D.   On: 10/03/2016 08:40   Mr [name] Head Wo Contrast  Result Date: 10/16/2016 CLINICAL DATA:  Sickle cell crisis, syncopal episode and unresponsive. EXAM: MRA HEAD WITHOUT CONTRAST TECHNIQUE: Angiographic images of the Circle of Willis were obtained using MRA technique without intravenous contrast. COMPARISON:  CT HEAD October 15, 2016 FINDINGS: Moderately motion degraded examination. ANTERIOR CIRCULATION: Normal flow related enhancement of the included  cervical, petrous, cavernous and supraclinoid internal carotid arteries. Patent anterior communicating artery. Duplicated appearance of the anterior middle cerebral arteries due to patient motion, vessels are patent. No severe stenosis. Limited assessment for aneurysm and vasculopathy due  to motion. POSTERIOR CIRCULATION: Codominant vertebral arteries. Basilar artery is patent, with normal flow related enhancement of the main branch vessels. Duplicated appearance of the posterior cerebral arteries due to patient motion, vessels are patent. No severe stenosis. Limited assessment for aneurysm and vasculopathy due to motion. ANATOMIC VARIANTS: None. IMPRESSION: No emergent large vessel occlusion or severe stenosis on this moderately motion degraded examination. Electronically Signed   By: Elon Alas M.D.   On: 10/16/2016 18:44   Ir Veno/ext/uni Right  Result Date: 10/12/2016 CLINICAL DATA:  Sickle cell disease. Right IJ port catheter placement 07/17/2016 due to poor peripheral venous access and recurrent venous access needs. Right arm swelling. Negative Doppler. EXAM: RIGHT EXTREMITY VENOGRAPHY; IR PTA VENOUS EXCEPT DIALYSIS CIRCUIT; IR ULTRASOUND GUIDANCE VASC ACCESS RIGHT PROCEDURE: The procedure, risks (including but not limited to bleeding, infection, organ damage ), benefits, and alternatives were explained to the patient. Questions regarding the procedure were encouraged and answered. The patient understands and consents to the procedure. Right upper arm prepped with chlorhexidine, draped in usual sterile fashion. Under real-time ultrasound guidance, the right brachial vein was accessed with a micropuncture 21 gauge Set for right upper extremity venography. Ultrasound documentation was saved. Right venous access and surrounding brachial region prepped and draped in usual sterile fashion. Maximal barrier sterile technique was utilized including caps, mask, sterile gowns, sterile gloves, sterile drape, hand hygiene and skin antiseptic. The right venous access was exchanged over a Bentson wire for a 7 [place] vascular sheath, through which an angled 5 [place] angiographic catheter was advanced and negotiated across the right brachiocephalic venous occlusion using an angled Glidewire. 3000  units heparin given IV. The catheter was exchanged over an Amplatz wire for a a 12 mm x 4 cm atlas angioplasty balloon, used to dilate the is obstruction. After final venogram, the catheter was removed and hemostasis achieved at the site. The patient tolerated the procedure well. ANESTHESIA/SEDATION: Intravenous Fentanyl and Versed were administered as conscious sedation during continuous cardiorespiratory monitoring by the radiology RN, with a total moderate sedation time of 16 minutes. MEDICATIONS: Lidocaine 1% subcutaneous CONTRAST:  1 ISOVUE-300 IOPAMIDOL (ISOVUE-300) INJECTION 61%, 1 ISOVUE-300 IOPAMIDOL (ISOVUE-300) INJECTION XX123456 COMPLICATIONS: None immediate FINDINGS: Right upper extremity venography demonstrates patency of right axillary and subclavian veins. There is a high-grade stenosis in the right brachiocephalic vein with right upper extremity drainage via thyrocervical and body wall collaterals. The collateral flow ultimately opacifies the left innominate vein, azygos arch, and SVC, shown to be patent to the right atrium. The right brachiocephalic vein stenosis responded well to the 12 mm balloon angioplasty. Follow-up venogram shows no significant residual or recurrent stenosis. No extravasation, dissection, or other apparent complication. No further filling of collateral channels. IMPRESSION: 1. High-grade right brachiocephalic vein stenosis, which likely accounted for the patient's right upper extremity swelling. 2. Good response of the lesion to 12 mm balloon angioplasty. Consider continued low level anticoagulation to prevent recurrence. Electronically Signed   By: Lucrezia Europe M.D.   On: 10/12/2016 11:18   Ir US Guide Vasc Access Right  Result Date: 10/12/2016 CLINICAL DATA:  Sickle cell disease. Right IJ port catheter placement 07/17/2016 due to poor peripheral venous access and recurrent venous access needs. Right arm swelling. Negative Doppler. EXAM: RIGHT EXTREMITY VENOGRAPHY; IR PTA  VENOUS EXCEPT DIALYSIS CIRCUIT; IR ULTRASOUND GUIDANCE VASC ACCESS RIGHT PROCEDURE: The procedure, risks (including but not limited to bleeding, infection, organ damage ), benefits, and alternatives were explained to the patient. Questions regarding the procedure were encouraged and answered. The patient understands and consents to the procedure. Right upper arm prepped with chlorhexidine, draped in usual sterile fashion. Under real-time ultrasound guidance, the right brachial vein was accessed with a micropuncture 21 gauge Set for right upper extremity venography. Ultrasound documentation was saved. Right venous access and surrounding brachial region prepped and draped in usual sterile fashion. Maximal barrier sterile technique was utilized including caps, mask, sterile gowns, sterile gloves, sterile drape, hand hygiene and skin antiseptic. The right venous access was exchanged over a Bentson wire for a 7 [place] vascular sheath, through which an angled 5 [place] angiographic catheter was advanced and negotiated across the right brachiocephalic venous occlusion using an angled Glidewire. 3000 units heparin given IV. The catheter was exchanged over an Amplatz wire for a a 12 mm x 4 cm atlas angioplasty balloon, used to dilate the is obstruction. After final venogram, the catheter was removed and hemostasis achieved at the site. The patient tolerated the procedure well. ANESTHESIA/SEDATION: Intravenous Fentanyl and Versed were administered as conscious sedation during continuous cardiorespiratory monitoring by the radiology RN, with a total moderate sedation time of 16 minutes. MEDICATIONS: Lidocaine 1% subcutaneous CONTRAST:  1 ISOVUE-300 IOPAMIDOL (ISOVUE-300) INJECTION 61%, 1 ISOVUE-300 IOPAMIDOL (ISOVUE-300) INJECTION XX123456 COMPLICATIONS: None immediate FINDINGS: Right upper extremity venography demonstrates patency of right axillary and subclavian veins. There is a high-grade stenosis in the right brachiocephalic  vein with right upper extremity drainage via thyrocervical and body wall collaterals. The collateral flow ultimately opacifies the left innominate vein, azygos arch, and SVC, shown to be patent to the right atrium. The right brachiocephalic vein stenosis responded well to the 12 mm balloon angioplasty. Follow-up venogram shows no significant residual or recurrent stenosis. No extravasation, dissection, or other apparent complication. No further filling of collateral channels. IMPRESSION: 1. High-grade right brachiocephalic vein stenosis, which likely accounted for the patient's right upper extremity swelling. 2. Good response of the lesion to 12 mm balloon angioplasty. Consider continued low level anticoagulation to prevent recurrence. Electronically Signed   By: Lucrezia Europe M.D.   On: 10/12/2016 11:18   Ir Pta Venous Except Dialysis Circuit  Result Date: 10/12/2016 CLINICAL DATA:  Sickle cell disease. Right IJ port catheter placement 07/17/2016 due to poor peripheral venous access and recurrent venous access needs. Right arm swelling. Negative Doppler. EXAM: RIGHT EXTREMITY VENOGRAPHY; IR PTA VENOUS EXCEPT DIALYSIS CIRCUIT; IR ULTRASOUND GUIDANCE VASC ACCESS RIGHT PROCEDURE: The procedure, risks (including but not limited to bleeding, infection, organ damage ), benefits, and alternatives were explained to the patient. Questions regarding the procedure were encouraged and answered. The patient understands and consents to the procedure. Right upper arm prepped with chlorhexidine, draped in usual sterile fashion. Under real-time ultrasound guidance, the right brachial vein was accessed with a micropuncture 21 gauge Set for right upper extremity venography. Ultrasound documentation was saved. Right venous access and surrounding brachial region prepped and draped in usual sterile fashion. Maximal barrier sterile technique was utilized including caps, mask, sterile gowns, sterile gloves, sterile drape, hand hygiene  and skin antiseptic. The right venous access was exchanged over a Bentson wire for a 7 [place] vascular sheath, through which an angled 5 [place] angiographic catheter was advanced and negotiated across the right brachiocephalic venous occlusion using an angled Glidewire. 3000 units heparin  given IV. The catheter was exchanged over an Amplatz wire for a a 12 mm x 4 cm atlas angioplasty balloon, used to dilate the is obstruction. After final venogram, the catheter was removed and hemostasis achieved at the site. The patient tolerated the procedure well. ANESTHESIA/SEDATION: Intravenous Fentanyl and Versed were administered as conscious sedation during continuous cardiorespiratory monitoring by the radiology RN, with a total moderate sedation time of 16 minutes. MEDICATIONS: Lidocaine 1% subcutaneous CONTRAST:  1 ISOVUE-300 IOPAMIDOL (ISOVUE-300) INJECTION 61%, 1 ISOVUE-300 IOPAMIDOL (ISOVUE-300) INJECTION XX123456 COMPLICATIONS: None immediate FINDINGS: Right upper extremity venography demonstrates patency of right axillary and subclavian veins. There is a high-grade stenosis in the right brachiocephalic vein with right upper extremity drainage via thyrocervical and body wall collaterals. The collateral flow ultimately opacifies the left innominate vein, azygos arch, and SVC, shown to be patent to the right atrium. The right brachiocephalic vein stenosis responded well to the 12 mm balloon angioplasty. Follow-up venogram shows no significant residual or recurrent stenosis. No extravasation, dissection, or other apparent complication. No further filling of collateral channels. IMPRESSION: 1. High-grade right brachiocephalic vein stenosis, which likely accounted for the patient's right upper extremity swelling. 2. Good response of the lesion to 12 mm balloon angioplasty. Consider continued low level anticoagulation to prevent recurrence. Electronically Signed   By: Lucrezia Europe M.D.   On: 10/12/2016 11:18     OTHER  PROCEDURES: Venous Duplex Ultrasound RUE No evidence of deep vein or superficial thrombosis involving the right upper extremity, left subclavian vein, and left internal jugular vein. - No changes since stidy of 10/06/2016.  ECHO: Study Conclusions  - Left ventricle: The cavity size was normal. Wall thickness was   increased in a pattern of mild LVH. Systolic function was   vigorous. The estimated ejection fraction was in the range of 65%   to 70%. Wall motion was normal; there were no regional wall   motion abnormalities. The study is not technically sufficient to   allow evaluation of LV diastolic function. - Mitral valve: Mildly thickened leaflets . There was trivial   regurgitation. - Left atrium: Severely dilated. - Right atrium: The atrium was mildly dilated. - Tricuspid valve: There was mild regurgitation. - Pulmonary arteries: PA peak pressure: 28 mm Hg (S). - Inferior vena cava: The vessel was normal in size. The   respirophasic diameter changes were in the normal range (>= 50%),   consistent with normal central venous pressure.  Impressions:  - Compared to prior study in 2017, there are no significant   changes   No results found for this or any previous visit (from the past 240 hour(s)).  BRIEF ADMITTING H & P: Celinda L Cobbis a 36 y.o.femalewith sickle cell anemia presents to the ER because of worsening lower extremity pain and chest pain. Patient was recently admitted for similar complaints and improved and was discharged home. Patient states last 2-3 days pain has been worsening. Denies any productive cough fever chills headaches or any visual symptoms of focal deficits. Chest pain is mostly in the lower sternum. No associated shortness of breath. In the ER patient is found to be hypoxic. Chest x-ray was unremarkable. D-dimer is elevated. Patient is being admitted for sickle cell pain crisis and possible acute chest syndrome.   Hospital Course:   Present on  Admission: . (Resolved) Sickle cell pain crisis ([place]) . (Resolved) Chest pain . (Resolved) Sickle cell crisis ([place]) . SVC obstruction . (Resolved) Dizziness . 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 She was managed with Dilaudid via PCA, Toradol and IVF. She had ongoing hemolysis which complicated the crisis and led to an acute hemolytic anemia requiring transfusion of 1 unit RBC's. Due to the acute on chronic anemia, her hydrea was held temporarily while hospitalized. However her bone marrow activity improved and Hydrea was resumed. At the time of discharge, her Hb was stable at 7.0 g/dL. Her hospital course was further complicated by swelling of RUE and RLE.  Which after investigation revealed SVC stenosis which required angioplasty with successful restoration of blood flow. The [facility] has recommended sub-therapeutic dose of Coumadin to maintain flow through the vessels and the port-o-cath. I have discussed this with her Primary MD- Dr. Doreene Burke and she is started on Coumadin 2.5 mg daily. She also developed thrombocytopenia (nadir 96) and Lovenox used for VTE prophylaxis had to be held due to risk of bleeding. Her platelets recovered after Lovenox was discontinued. At the time of discharge, her platelets were 168. Pt also had a mild hypoxia but the resolved and at the time of discharge, he oxygen levels were 95-98% while ambulating on RA. Pt also had some electrolyte derangement involving Potassium and Magnesium both of which were replaced orally and by IV. She has been started on chronic supplementation of Potassium and Magnesium. She had an episode of near-syncope on the day of expected discharge. Discharge was delayed and she was evaluated with an EEG which was normal and and ECHO which showed a severely dilated LA which is essentially unchanged from previous ECHO. Pt had no further episodes  and I would recommend follow up with Neurology as an out patient.    Disposition and Follow-up:  Pt is discharged home in good condition and is to follow up with her PMD in 3 days, Discharge Instructions    Activity as tolerated - No restrictions    Complete by:  As directed    Diet general    Complete by:  As directed       DISCHARGE EXAM:  General: Alert, awake, oriented x3, in no apparent distress. Well appearing. HEENT: [place]/AT PEERL, EOMI, mild icteris (baseline). Neck: Trachea midline, no masses, no thyromegal,y no JVD, no carotid bruit OROPHARYNX: Moist, No exudate/ erythema/lesions.  Heart: Regular rate and rhythm, without murmurs, rubs, gallops or S3. PMI non-displaced. Exam reveals no decreased pulses. Pulmonary/Chest: Normal effort. Breath sounds normal. No. Apnea. Clear to auscultation,no stridor, no wheezing and no rhonchi noted. No respiratory distress and no tenderness noted. Abdomen: Soft, nontender, nondistended, normal bowel sounds, no masses no hepatosplenomegaly noted. No fluid wave and no ascites. There is no guarding or rebound. Neuro: Alert and oriented to person, place and time. Normal motor skills, Displays no atrophy or tremors and exhibits normal muscle tone.  No focal neurological deficits noted cranial nerves II through XII grossly intact. No sensory deficit noted. Strength at baseline in bilateral upper and lower extremities. Gait normal. Musculoskeletal: No warmth swelling or erythema around joints, no spinal tenderness noted. Swelling of RUE and RLE resolved. Psychiatric: Patient alert and oriented x3, good insight and cognition, good recent to remote recall. Mood, memory, affect and judgement normal Skin: Skin is warm and dry. No bruising, no ecchymosis and no rash noted. Pt is not diaphoretic. No erythema. No pallor  Blood pressure 122/77, pulse 97, temperature 98.2 F (36.8 C), temperature source Oral, resp. rate 18, height 5\' 3"  (1.6 m),  weight 73 kg  (161 lb), SpO2 96 %.   Recent Labs  10/15/16 1100 10/15/16 1946 10/16/16 0400  NA 136 136  --   K 3.3* 4.3  --   CL 105 103  --   CO2 26 27  --   GLUCOSE 122* 127*  --   BUN <5* 6  --   CREATININE 0.48 <0.30*  --   CALCIUM 8.0* 8.2*  --   MG 1.6*  --  1.9   No results for input(s): AST, ALT, ALKPHOS, BILITOT, PROT, ALBUMIN in the last 72 hours. No results for input(s): LIPASE, AMYLASE in the last 72 hours.  Recent Labs  10/15/16 1100 10/16/16 0400  WBC 9.7 10.3  NEUTROABS 5.6 6.2  HGB 7.0* 6.8*  HCT 18.9* 18.9*  MCV 95.5 95.9  PLT 168 185     Total time spent including face to face and decision making was greater than 30 minutes  Signed: [name],[name] 10/17/2016, 3:03 PM

## 2016-10-19 ENCOUNTER — Encounter (HOSPITAL_COMMUNITY): Payer: Self-pay | Admitting: *Deleted

## 2016-10-19 ENCOUNTER — Emergency Department (HOSPITAL_COMMUNITY)
Admission: EM | Admit: 2016-10-19 | Discharge: 2016-10-19 | Disposition: A | Discharge status: Home / Self Care | Payer: Medicare Other | Attending: Emergency Medicine | Admitting: Emergency Medicine

## 2016-10-19 DIAGNOSIS — D5700 Hb-SS disease with crisis, unspecified: Secondary | ICD-10-CM

## 2016-10-19 DIAGNOSIS — Z7982 Long term (current) use of aspirin: Secondary | ICD-10-CM | POA: Diagnosis not present

## 2016-10-19 DIAGNOSIS — M549 Dorsalgia, unspecified: Secondary | ICD-10-CM | POA: Diagnosis present

## 2016-10-19 DIAGNOSIS — D57219 Sickle-cell/Hb-C disease with crisis, unspecified: Secondary | ICD-10-CM | POA: Insufficient documentation

## 2016-10-19 LAB — COMPREHENSIVE METABOLIC PANEL
ALBUMIN: 2.4 g/dL — AB (ref 3.5–5.0)
ALK PHOS: 124 U/L (ref 38–126)
ALT: 40 U/L (ref 14–54)
ANION GAP: 7 (ref 5–15)
AST: 134 U/L — ABNORMAL HIGH (ref 15–41)
BUN: 6 mg/dL (ref 6–20)
CALCIUM: 8.6 mg/dL — AB (ref 8.9–10.3)
CO2: 23 mmol/L (ref 22–32)
Chloride: 104 mmol/L (ref 101–111)
Creatinine, Ser: 0.48 mg/dL (ref 0.44–1.00)
GFR calc Af Amer: >60 mL/min (ref >60)
GFR calc non Af Amer: >60 mL/min (ref >60)
GLUCOSE: 98 mg/dL (ref 65–99)
Potassium: 3.8 mmol/L (ref 3.5–5.1)
SODIUM: 134 mmol/L — AB (ref 135–145)
Total Bilirubin: 13.8 mg/dL — ABNORMAL HIGH (ref 0.3–1.2)
Total Protein: 8.3 g/dL — ABNORMAL HIGH (ref 6.5–8.1)

## 2016-10-19 LAB — CBC WITH DIFFERENTIAL/PLATELET
BASOS PCT: 0 %
Basophils Absolute: 0 10*3/uL (ref 0.0–0.1)
EOS ABS: 0 10*3/uL (ref 0.0–0.7)
Eosinophils Relative: 0 %
HEMATOCRIT: 24.4 % — AB (ref 36.0–46.0)
HEMOGLOBIN: 8.9 g/dL — AB (ref 12.0–15.0)
LYMPHS PCT: 20 %
Lymphs Abs: 1.4 10*3/uL (ref 0.7–4.0)
MCH: 36.9 pg — AB (ref 26.0–34.0)
MCHC: 36.5 g/dL — ABNORMAL HIGH (ref 30.0–36.0)
MCV: 101.2 fL — AB (ref 78.0–100.0)
Monocytes Absolute: 0.8 10*3/uL (ref 0.1–1.0)
Monocytes Relative: 12 %
NEUTROS ABS: 4.7 10*3/uL (ref 1.7–7.7)
Neutrophils Relative %: 68 %
PLATELETS: 285 10*3/uL (ref 150–400)
RBC: 2.41 MIL/uL — ABNORMAL LOW (ref 3.87–5.11)
RDW: 22.8 % — ABNORMAL HIGH (ref 11.5–15.5)
WBC: 6.9 10*3/uL (ref 4.0–10.5)

## 2016-10-19 LAB — RETICULOCYTES
RBC.: 2.41 MIL/uL — ABNORMAL LOW (ref 3.87–5.11)
Retic Count, Absolute: 433.8 10*3/uL — ABNORMAL HIGH (ref 19.0–186.0)
Retic Ct Pct: 18 % — ABNORMAL HIGH (ref 0.4–3.1)

## 2016-10-19 MED ORDER — HYDROMORPHONE HCL 2 MG/ML IJ SOLN
2.0000 mg | INTRAMUSCULAR | Status: DC
  Filled 2016-10-19: qty 1

## 2016-10-19 MED ORDER — ONDANSETRON HCL 4 MG/2ML IJ SOLN
4.0000 mg | INTRAMUSCULAR | Status: DC | PRN
  Administered 2016-10-19: 4 mg via INTRAVENOUS
  Filled 2016-10-19: qty 2

## 2016-10-19 MED ORDER — KETOROLAC TROMETHAMINE 30 MG/ML IJ SOLN
30.0000 mg | INTRAMUSCULAR | Status: AC
  Administered 2016-10-19: 30 mg via INTRAVENOUS
  Filled 2016-10-19: qty 1

## 2016-10-19 MED ORDER — DIPHENHYDRAMINE HCL 50 MG/ML IJ SOLN
25.0000 mg | Freq: Once | INTRAMUSCULAR | Status: AC
  Administered 2016-10-19: 25 mg via INTRAVENOUS
  Filled 2016-10-19: qty 1

## 2016-10-19 MED ORDER — HYDROMORPHONE HCL 2 MG/ML IJ SOLN
2.0000 mg | INTRAMUSCULAR | Status: AC
  Administered 2016-10-19: 2 mg via INTRAVENOUS
  Filled 2016-10-19: qty 1

## 2016-10-19 MED ORDER — DEXTROSE-NACL 5-0.2 % IV SOLN: INTRAVENOUS | Status: DC

## 2016-10-19 MED ORDER — HYDROMORPHONE HCL 2 MG/ML IJ SOLN: 2.0000 mg | INTRAMUSCULAR | Status: DC

## 2016-10-19 MED ORDER — HYDROMORPHONE HCL 2 MG/ML IJ SOLN: 2.0000 mg | INTRAMUSCULAR | Status: AC

## 2016-10-19 MED ORDER — HYDROMORPHONE HCL 1 MG/ML IJ SOLN: 1.0000 mg | Freq: Once | INTRAMUSCULAR | Status: DC

## 2016-10-19 MED ORDER — HYDROMORPHONE HCL 2 MG/ML IJ SOLN
2.0000 mg | Freq: Once | INTRAMUSCULAR | Status: AC
  Administered 2016-10-19: 2 mg via INTRAMUSCULAR

## 2016-10-19 NOTE — Discharge Instructions (Signed)
Please continue your current medications. Please see MD at the [facility] or return to the ED if any changes or problem.

## 2016-10-19 NOTE — ED Triage Notes (Signed)
Pt states she was recently admitted at [facility] for sickle cell pain (discharged Wednesday). Pt had pain at discharge but her pain got worse last night. Pt denies any n/v/d. Pt having pain in her back and legs.

## 2016-10-19 NOTE — ED Notes (Addendum)
Pt made aware to return if symptoms worsen or if any life threatening symptoms occur.  Pt leaving with sister at this time.

## 2016-10-19 NOTE — ED Notes (Signed)
IV infiltrated, fluids stopped. [place] notified.  Verbal ordered 2mg  dilaudid IM.

## 2016-10-20 ENCOUNTER — Encounter (HOSPITAL_COMMUNITY): Payer: Self-pay | Admitting: Emergency Medicine

## 2016-10-20 ENCOUNTER — Emergency Department (HOSPITAL_COMMUNITY)
Admission: EM | Admit: 2016-10-20 | Discharge: 2016-10-20 | Disposition: A | Discharge status: Home / Self Care | Payer: Medicare Other | Attending: Emergency Medicine | Admitting: Emergency Medicine

## 2016-10-20 DIAGNOSIS — Z79899 Other long term (current) drug therapy: Secondary | ICD-10-CM | POA: Diagnosis not present

## 2016-10-20 DIAGNOSIS — Z7982 Long term (current) use of aspirin: Secondary | ICD-10-CM | POA: Diagnosis not present

## 2016-10-20 DIAGNOSIS — Z91040 Latex allergy status: Secondary | ICD-10-CM | POA: Insufficient documentation

## 2016-10-20 DIAGNOSIS — D5700 Hb-SS disease with crisis, unspecified: Secondary | ICD-10-CM | POA: Diagnosis not present

## 2016-10-20 DIAGNOSIS — M549 Dorsalgia, unspecified: Secondary | ICD-10-CM | POA: Diagnosis present

## 2016-10-20 DIAGNOSIS — D57219 Sickle-cell/Hb-C disease with crisis, unspecified: Secondary | ICD-10-CM | POA: Diagnosis not present

## 2016-10-20 LAB — MAGNESIUM: MAGNESIUM: 1.8 mg/dL (ref 1.7–2.4)

## 2016-10-20 LAB — URINALYSIS, ROUTINE W REFLEX MICROSCOPIC
Bilirubin Urine: NEGATIVE
GLUCOSE, UA: NEGATIVE mg/dL
Ketones, ur: NEGATIVE mg/dL
Leukocytes, UA: NEGATIVE
Nitrite: NEGATIVE
PH: 7 (ref 5.0–8.0)
Protein, ur: 100 mg/dL — AB
SPECIFIC GRAVITY, URINE: 1.008 (ref 1.005–1.030)

## 2016-10-20 LAB — CBC WITH DIFFERENTIAL/PLATELET
Basophils Absolute: 0 10*3/uL (ref 0.0–0.1)
Basophils Relative: 1 %
EOS ABS: 0 10*3/uL (ref 0.0–0.7)
EOS PCT: 0 %
HCT: 23.7 % — ABNORMAL LOW (ref 36.0–46.0)
Hemoglobin: 8.4 g/dL — ABNORMAL LOW (ref 12.0–15.0)
LYMPHS ABS: 1.4 10*3/uL (ref 0.7–4.0)
LYMPHS PCT: 25 %
MCH: 35.9 pg — AB (ref 26.0–34.0)
MCHC: 35.4 g/dL (ref 30.0–36.0)
MCV: 101.3 fL — AB (ref 78.0–100.0)
MONO ABS: 0.7 10*3/uL (ref 0.1–1.0)
Monocytes Relative: 11 %
Neutro Abs: 3.7 10*3/uL (ref 1.7–7.7)
Neutrophils Relative %: 64 %
PLATELETS: 307 10*3/uL (ref 150–400)
RBC: 2.34 MIL/uL — ABNORMAL LOW (ref 3.87–5.11)
RDW: 22.2 % — AB (ref 11.5–15.5)
WBC: 5.8 10*3/uL (ref 4.0–10.5)

## 2016-10-20 LAB — COMPREHENSIVE METABOLIC PANEL
ALBUMIN: 2.4 g/dL — AB (ref 3.5–5.0)
ALT: 36 U/L (ref 14–54)
AST: 125 U/L — AB (ref 15–41)
Alkaline Phosphatase: 118 U/L (ref 38–126)
Anion gap: 8 (ref 5–15)
BILIRUBIN TOTAL: 12.4 mg/dL — AB (ref 0.3–1.2)
BUN: 8 mg/dL (ref 6–20)
CHLORIDE: 106 mmol/L (ref 101–111)
CO2: 22 mmol/L (ref 22–32)
CREATININE: 0.53 mg/dL (ref 0.44–1.00)
Calcium: 8.4 mg/dL — ABNORMAL LOW (ref 8.9–10.3)
GFR calc Af Amer: >60 mL/min (ref >60)
GFR calc non Af Amer: >60 mL/min (ref >60)
GLUCOSE: 89 mg/dL (ref 65–99)
Potassium: 3.6 mmol/L (ref 3.5–5.1)
Sodium: 136 mmol/L (ref 135–145)
TOTAL PROTEIN: 8.2 g/dL — AB (ref 6.5–8.1)

## 2016-10-20 LAB — RETICULOCYTES
RBC.: 2.34 MIL/uL — ABNORMAL LOW (ref 3.87–5.11)
RETIC CT PCT: 17.5 % — AB (ref 0.4–3.1)
Retic Count, Absolute: 409.5 10*3/uL — ABNORMAL HIGH (ref 19.0–186.0)

## 2016-10-20 MED ORDER — HYDROMORPHONE HCL 2 MG/ML IJ SOLN: 2.0000 mg | INTRAMUSCULAR | Status: AC

## 2016-10-20 MED ORDER — HYDROMORPHONE HCL 2 MG/ML IJ SOLN
2.0000 mg | INTRAMUSCULAR | Status: DC
  Administered 2016-10-20 (×2): 2 mg via INTRAVENOUS
  Filled 2016-10-20 (×2): qty 1

## 2016-10-20 MED ORDER — KETOROLAC TROMETHAMINE 30 MG/ML IJ SOLN
30.0000 mg | INTRAMUSCULAR | Status: AC
  Administered 2016-10-20: 30 mg via INTRAVENOUS
  Filled 2016-10-20: qty 1

## 2016-10-20 MED ORDER — HEPARIN SOD (PORK) LOCK FLUSH 100 UNIT/ML IV SOLN
500.0000 [IU] | Freq: Once | INTRAVENOUS | Status: AC
  Administered 2016-10-20: 500 [IU]

## 2016-10-20 MED ORDER — PROCHLORPERAZINE EDISYLATE 5 MG/ML IJ SOLN
10.0000 mg | Freq: Once | INTRAMUSCULAR | Status: AC
  Administered 2016-10-20: 10 mg via INTRAVENOUS
  Filled 2016-10-20: qty 2

## 2016-10-20 MED ORDER — DEXTROSE-NACL 5-0.45 % IV SOLN
INTRAVENOUS | Status: DC
  Administered 2016-10-20: 1000 mL via INTRAVENOUS

## 2016-10-20 MED ORDER — HYDROMORPHONE HCL 2 MG/ML IJ SOLN
2.0000 mg | INTRAMUSCULAR | Status: AC
  Administered 2016-10-20: 2 mg via INTRAVENOUS
  Filled 2016-10-20: qty 1

## 2016-10-20 MED ORDER — HYDROMORPHONE HCL 2 MG/ML IJ SOLN: 2.0000 mg | INTRAMUSCULAR | Status: DC

## 2016-10-20 MED ORDER — DIPHENHYDRAMINE HCL 50 MG/ML IJ SOLN
25.0000 mg | Freq: Once | INTRAMUSCULAR | Status: AC
  Administered 2016-10-20: 25 mg via INTRAVENOUS
  Filled 2016-10-20: qty 1

## 2016-10-20 MED ORDER — HEPARIN SOD (PORK) LOCK FLUSH 100 UNIT/ML IV SOLN
INTRAVENOUS | Status: AC
  Administered 2016-10-20: 500 [IU]
  Filled 2016-10-20: qty 5

## 2016-10-20 NOTE — ED Provider Notes (Signed)
[employer] DEPT Provider Note   CSN: [id number] Arrival date & time: 10/19/16  [id number]     History   Chief Complaint Chief Complaint  Patient presents with  . Sickle Cell Pain Crisis    HPI Dawn [name] is a 36 y.o. female.  Patient is a 36 year old female who presents to the emergency department with a complaint of sickle cell pain. Patient states she has a long-time history of sickle cell disease. She was recently discharged from the [facility] long hospital sickle cell center because of crisis related problems. On last evening, the patient states that her pain got worse. This morning she presents to the emergency department with a complaint of back and leg pain primarily. His been no high fevers reported. No nausea, no vomiting, no diarrhea. No difficulty with breathing. No recent injury reported. No recent operations or procedures. She presents at this time requesting assistance with her crisis pain.    The history is provided by the patient.  Sickle Cell Pain Crisis  Associated symptoms: no chest pain, no cough, no shortness of breath and no wheezing     Past Medical History:  Diagnosis Date  . Blood transfusion   . Demand ischemia ([place]) 02/06/2016  . HCAP (healthcare-associated pneumonia) 05/19/2013  . 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 Pulmonary hypertension 02/06/2016   49 mmHg per echo on 02/06/16   . 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 Sickle cell anemia (HCC)   . Sickle cell disease ([place])   . 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 Vitamin B12 deficiency 07/07/2015    Patient Active Problem List   Diagnosis Date Noted  . Syncope 10/17/2016  . Dizziness   . Hypomagnesemia   . Swelling of right upper extremity   . 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 Positive D dimer   . Pulmonary hypertension 02/16/2016  . 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 Anemia of chronic disease   . Chronic pain syndrome 10/14/2015  . 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  Vitamin B12 deficiency 07/07/2015  . Thrombocytopenia ([place]) 07/06/2015  . Hyperbilirubinemia 09/11/2014  . Macrocytosis 06/13/2014  . Vitamin D deficiency 11/23/2013  . 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 Hypokalemia 03/28/2012  . 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 Elevated LFTs 04/27/2011  . Sickle cell disease ([place]) 04/26/2011    Past Surgical History:  Procedure Laterality Date  .  left knee ACL reconstruction    . CESAREAN SECTION     x 2  . CHOLECYSTECTOMY    . IR GENERIC HISTORICAL  07/17/2016   IR FLUORO GUIDE PORT INSERTION RIGHT 07/17/2016 Aletta Edouard, MD [facility]  . IR GENERIC HISTORICAL  07/17/2016   IR US GUIDE VASC ACCESS RIGHT 07/17/2016 Aletta Edouard, MD [facility]  . IR GENERIC HISTORICAL  10/12/2016   IR PTA VENOUS EXCEPT DIALYSIS CIRCUIT 10/12/2016 Arne Cleveland, MD [facility]  . IR GENERIC HISTORICAL  10/12/2016   IR US GUIDE VASC ACCESS RIGHT 10/12/2016 Arne Cleveland, MD [facility]  . IR GENERIC HISTORICAL  10/12/2016   IR VENO/EXT/UNI RIGHT 10/12/2016 Arne Cleveland, MD [facility]  . PORT-A-CATH REMOVAL Left 07/29/2013   Procedure: REMOVAL PORT-A-CATH;  Surgeon: Scherry Ran, MD;  Location: [facility] ORS;  Service: General;  Laterality: Left;  . PORTACATH PLACEMENT    . PORTACATH PLACEMENT Left 02/23/2013   Procedure: INSERTION PORT-A-CATH;  Surgeon: Donato Heinz, MD;  Location: [facility] ORS;  Service: General;  Laterality: Left;  . TEE WITHOUT CARDIOVERSION N/A 08/07/2013   Procedure: TRANSESOPHAGEAL ECHOCARDIOGRAM (TEE);  Surgeon: Arnoldo Lenis, MD;  Location: [facility];  Service: Cardiology;  Laterality: N/A;    OB History    Gravida Para Term Preterm AB Living   1 1   1   1    SAB TAB Ectopic Multiple Live Births                   Home Medications    Prior to Admission medications   Medication Sig Start Date End Date Taking? Authorizing Provider    aspirin EC 81 MG EC tablet Take 1 tablet (81 mg total) by mouth daily. 10/18/16  Yes Leana Gamer, MD  folic acid (FOLVITE) 1 MG tablet Take 1 tablet (1 mg total) by mouth daily. 03/14/16  Yes [name], FNP  hydroxyurea (HYDREA) 500 MG capsule Take 2 capsules (1,000 mg total) by mouth daily. May take with food to minimize GI side effects. 03/14/16  Yes [name], FNP  loratadine (CLARITIN) 10 MG tablet Take 10 mg by mouth daily.   Yes Historical Provider, MD  magnesium oxide (MAG-OX) 400 (241.3 Mg) MG tablet Take 1 tablet (400 mg total) by mouth daily. 10/15/16  Yes Leana Gamer, MD  morphine (MS CONTIN) 15 MG 12 hr tablet Take 15 mg by mouth every 12 (twelve) hours.   Yes Historical Provider, MD  Multiple Vitamin (MULTIVITAMIN WITH MINERALS) TABS tablet Take 1 tablet by mouth daily.   Yes Historical Provider, MD  Oxycodone HCl 10 MG TABS Take 1 tablet (10 mg total) by mouth every 6 (six) hours as needed. 09/25/16  Yes Kinnie Feil, PA-C  potassium chloride SA (K-DUR,KLOR-CON) 20 MEQ tablet Take 2 tablets (40 mEq total) by mouth daily. 10/15/16  Yes Leana Gamer, MD  promethazine (PHENERGAN) 12.5 MG tablet Take 1 tablet (12.5 mg total) by mouth every 6 (six) hours as needed for nausea or vomiting. 09/25/16  Yes Kinnie Feil, PA-C  Vitamin D, Ergocalciferol, (DRISDOL) 50000 units CAPS capsule Take 1 capsule (50,000 Units total) by mouth every 7 (seven) days. Takes on Sundays. 06/01/16  Yes Micheline Chapman, NP  levonorgestrel (MIRENA) 20 MCG/24HR IUD 1 each by Intrauterine route once.    Historical Provider, MD    Family History Family History  Problem Relation Age of Onset  . Sickle cell trait Mother   . Sickle cell trait Father   . Hypertension Father   . Diabetes Father   . Sickle cell trait Sister   . Cancer Paternal Aunt     Breast    Social History Social History  Substance Use Topics  . Smoking status: Never Smoker  . Smokeless tobacco: Never  Used  . Alcohol use No     Allergies   Desferal [deferoxamine]; Latex; Lisinopril; Tape; and Tylenol [acetaminophen]   Review of Systems Review of Systems  Constitutional: Positive for appetite change. Negative for activity change.       All ROS Neg except as noted in HPI  HENT: Negative for nosebleeds.   Eyes: Negative for photophobia and discharge.  Respiratory: Negative for cough, shortness of breath and wheezing.   Cardiovascular: Negative for chest pain and palpitations.  Gastrointestinal: Negative for abdominal pain and blood in stool.  Genitourinary: Negative for dysuria, frequency and hematuria.  Musculoskeletal: Positive for back pain and myalgias. Negative for arthralgias and neck pain.  Skin: Negative.   Neurological: Negative  for dizziness, seizures and speech difficulty.  Psychiatric/Behavioral: Negative for confusion and hallucinations.     Physical Exam Updated Vital Signs BP 127/74   Pulse 86   Temp 97.9 F (36.6 C) (Oral)   Resp 14   Ht 5\' 3"  (1.6 m)   Wt 73 kg   SpO2 95%   BMI 28.52 kg/m   Physical Exam  Constitutional: She is oriented to person, place, and time. She appears well-developed and well-nourished.  Non-toxic appearance.  HENT:  Head: Normocephalic.  Right Ear: Tympanic membrane and external ear normal.  Left Ear: Tympanic membrane and external ear normal.  Eyes: EOM and lids are normal. Pupils are equal, round, and reactive to light.  Neck: Normal range of motion. Neck supple. Carotid bruit is not present.  Cardiovascular: Normal rate, regular rhythm, normal heart sounds, intact distal pulses and normal pulses.  Exam reveals no gallop and no friction rub.   Pulmonary/Chest: Breath sounds normal. No respiratory distress.  There is symmetrical rise and fall of the chest. Patient speaks in complete sentences without problem.  Abdominal: Soft. Bowel sounds are normal. There is no tenderness. There is no guarding.  Musculoskeletal: Normal  range of motion.  There is pain with change of position involving the back. There is pain with range of motion of the lower extremities. There no hot joints. There no hot areas on the back. The radial pulses are 2+, the dorsalis pedis pulses are 2+.  Lymphadenopathy:       Head (right side): No submandibular adenopathy present.       Head (left side): No submandibular adenopathy present.    She has no cervical adenopathy.  Neurological: She is alert and oriented to person, place, and time. She has normal strength. No cranial nerve deficit or sensory deficit.  Skin: Skin is warm and dry.  Psychiatric: She has a normal mood and affect. Her speech is normal.  Nursing note and vitals reviewed.    ED Treatments / Results  Labs (all labs ordered are listed, but only abnormal results are displayed) Labs Reviewed  COMPREHENSIVE METABOLIC PANEL - Abnormal; Notable for the following:       Result Value   Sodium 134 (*)    Calcium 8.6 (*)    Total Protein 8.3 (*)    Albumin 2.4 (*)    AST 134 (*)    Total Bilirubin 13.8 (*)    All other components within normal limits  CBC WITH DIFFERENTIAL/PLATELET - Abnormal; Notable for the following:    RBC 2.41 (*)    Hemoglobin 8.9 (*)    HCT 24.4 (*)    MCV 101.2 (*)    MCH 36.9 (*)    MCHC 36.5 (*)    RDW 22.8 (*)    All other components within normal limits  RETICULOCYTES - Abnormal; Notable for the following:    Retic Ct Pct 18.0 (*)    RBC. 2.41 (*)    Retic Count, Manual 433.8 (*)    All other components within normal limits    EKG  EKG Interpretation None       Radiology No results found.  Procedures Procedures (including critical care time)  Medications Ordered in ED Medications  ketorolac (TORADOL) 30 MG/ML injection 30 mg (30 mg Intravenous Given 10/19/16 1102)  HYDROmorphone (DILAUDID) injection 2 mg (2 mg Intravenous Given 10/19/16 1101)    Or  HYDROmorphone (DILAUDID) injection 2 mg ( Subcutaneous See Alternative  10/19/16 1101)  HYDROmorphone (DILAUDID) injection 2  mg (2 mg Intravenous Given 10/19/16 1154)    Or  HYDROmorphone (DILAUDID) injection 2 mg ( Subcutaneous See Alternative 10/19/16 1154)  diphenhydrAMINE (BENADRYL) injection 25 mg (25 mg Intravenous Given 10/19/16 1102)  HYDROmorphone (DILAUDID) injection 2 mg (2 mg Intramuscular Given 10/19/16 1335)     Initial Impression / Assessment and Plan / ED Course  I have reviewed the triage vital signs and the nursing notes.  Pertinent labs & imaging results that were available during my care of the patient were reviewed by me and considered in my medical decision making (see chart for details).     **I have reviewed nursing notes, vital signs, and all appropriate lab and imaging results for this patient.*  Final Clinical Impressions(s) / ED Diagnoses  MDM  Patient presents to the emergency department with a complaint of sickle cell crisis. Her vital signs have been monitored closely. She shows no signs of ulcers of the upper or lower extremities. She speaks in complete sentences, does not appear to be short of breath or having difficulty breathing on. I doubt any kind of lung crisis. Is no evidence for cerebrovascular stroke or issues.  Patient was given IV fluids, IV Dilaudid, and IV Toradol.  After several hours in the emergency department in multiple rounds of medication, the patient feels that she can manage the pain at home. Patient is discharged ambulatory. And in no distress. She will see her primary physician, or return to the emergency department, or see the physicians at the sickle cell center if any changes, problems, or concerns.    Final diagnoses:  Sickle cell pain crisis [facility])    New Prescriptions Discharge Medication List as of 10/19/2016  2:53 PM       Lily Kocher, PA-C 10/20/16 [place], MD 10/22/16 [phone]

## 2016-10-20 NOTE — ED Provider Notes (Signed)
[facility] DEPT Provider Note   CSN: [id number] Arrival date & time: 10/20/16  [id number]     History   Chief Complaint Chief Complaint  Patient presents with  . Sickle Cell Pain Crisis    HPI Dawn [name] is a 36 y.o. female.  Patient is a 36 year old female with sickle cell disease who presents to the emergency department having a sickle cell pain crisis. It is of note that the patient was recently discharged from the sickle cell center on. She was seen in the emergency department on yesterday January 26. She received IV fluids and pain medication. She states that she did fine for several hours after going home, but during the night she had to take more and more of her oral medication. She did not have a very peaceful night because of pain. This morning she presents because she feels the pain is getting worse instead of getting better. She denies fever, chills, nausea, vomiting, diarrhea. She states that the pain in her back and legs was severe enough that she did not feel up to walking from the waiting area to her treatment room. She presents now for assistance with this pain.   The history is provided by the patient.  Sickle Cell Pain Crisis  Associated symptoms: no chest pain, no cough, no fever, no nausea, no shortness of breath, no vomiting and no wheezing     Past Medical History:  Diagnosis Date  . Blood transfusion   . Demand ischemia ([place]) 02/06/2016  . HCAP (healthcare-associated pneumonia) 05/19/2013  . 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 Pulmonary hypertension 02/06/2016   49 mmHg per echo on 02/06/16   . 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 Sickle cell anemia (HCC)   . Sickle cell disease ([place])   . 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 Vitamin B12 deficiency 07/07/2015    Patient Active Problem List   Diagnosis Date Noted  . Syncope 10/17/2016  . Dizziness   . Hypomagnesemia   . Swelling of right upper extremity   . 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  Positive D dimer   . Pulmonary hypertension 02/16/2016  . 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 Anemia of chronic disease   . Chronic pain syndrome 10/14/2015  . 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 Vitamin B12 deficiency 07/07/2015  . Thrombocytopenia ([place]) 07/06/2015  . Hyperbilirubinemia 09/11/2014  . Macrocytosis 06/13/2014  . Vitamin D deficiency 11/23/2013  . 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 Hypokalemia 03/28/2012  . 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 Elevated LFTs 04/27/2011  . Sickle cell disease ([place]) 04/26/2011    Past Surgical History:  Procedure Laterality Date  .  left knee ACL reconstruction    . CESAREAN SECTION     x 2  . CHOLECYSTECTOMY    . IR GENERIC HISTORICAL  07/17/2016   IR FLUORO GUIDE PORT INSERTION RIGHT 07/17/2016 Aletta Edouard, MD [facility]  . IR GENERIC HISTORICAL  07/17/2016   IR US GUIDE VASC ACCESS RIGHT 07/17/2016 Aletta Edouard, MD [facility]  . IR GENERIC HISTORICAL  10/12/2016   IR PTA VENOUS EXCEPT DIALYSIS CIRCUIT 10/12/2016 Arne Cleveland, MD [facility]  . IR GENERIC HISTORICAL  10/12/2016   IR US GUIDE VASC ACCESS RIGHT 10/12/2016 Arne Cleveland, MD [facility]  . IR GENERIC HISTORICAL  10/12/2016   IR VENO/EXT/UNI RIGHT 10/12/2016 Arne Cleveland, MD [facility]  . PORT-A-CATH REMOVAL Left 07/29/2013   Procedure: REMOVAL  PORT-A-CATH;  Surgeon: Scherry Ran, MD;  Location: [facility];  Service: General;  Laterality: Left;  . PORTACATH PLACEMENT    . PORTACATH PLACEMENT Left 02/23/2013   Procedure: INSERTION PORT-A-CATH;  Surgeon: Donato Heinz, MD;  Location: [facility];  Service: General;  Laterality: Left;  . TEE WITHOUT CARDIOVERSION N/A 08/07/2013   Procedure: TRANSESOPHAGEAL ECHOCARDIOGRAM (TEE);  Surgeon: Arnoldo Lenis, MD;  Location: [facility];  Service: Cardiology;  Laterality: N/A;    OB History    Gravida Para Term Preterm  AB Living   1 1   1   1    SAB TAB Ectopic Multiple Live Births                   Home Medications    Prior to Admission medications   Medication Sig Start Date End Date Taking? Authorizing Provider  aspirin EC 81 MG EC tablet Take 1 tablet (81 mg total) by mouth daily. 10/18/16   Leana Gamer, MD  folic acid (FOLVITE) 1 MG tablet Take 1 tablet (1 mg total) by mouth daily. 03/14/16   [name], FNP  hydroxyurea (HYDREA) 500 MG capsule Take 2 capsules (1,000 mg total) by mouth daily. May take with food to minimize GI side effects. 03/14/16   [name], FNP  levonorgestrel (MIRENA) 20 MCG/24HR IUD 1 each by Intrauterine route once.    Historical Provider, MD  loratadine (CLARITIN) 10 MG tablet Take 10 mg by mouth daily.    Historical Provider, MD  magnesium oxide (MAG-OX) 400 (241.3 Mg) MG tablet Take 1 tablet (400 mg total) by mouth daily. 10/15/16   Leana Gamer, MD  morphine (MS CONTIN) 15 MG 12 hr tablet Take 15 mg by mouth every 12 (twelve) hours.    Historical Provider, MD  Multiple Vitamin (MULTIVITAMIN WITH MINERALS) TABS tablet Take 1 tablet by mouth daily.    Historical Provider, MD  Oxycodone HCl 10 MG TABS Take 1 tablet (10 mg total) by mouth every 6 (six) hours as needed. 09/25/16   Kinnie Feil, PA-C  potassium chloride SA (K-DUR,KLOR-CON) 20 MEQ tablet Take 2 tablets (40 mEq total) by mouth daily. 10/15/16   Leana Gamer, MD  promethazine (PHENERGAN) 12.5 MG tablet Take 1 tablet (12.5 mg total) by mouth every 6 (six) hours as needed for nausea or vomiting. 09/25/16   Kinnie Feil, PA-C  Vitamin D, Ergocalciferol, (DRISDOL) 50000 units CAPS capsule Take 1 capsule (50,000 Units total) by mouth every 7 (seven) days. Takes on Sundays. 06/01/16   Micheline Chapman, NP    Family History Family History  Problem Relation Age of Onset  . Sickle cell trait Mother   . Sickle cell trait Father   . Hypertension Father   . Diabetes Father   . Sickle  cell trait Sister   . Cancer Paternal Aunt     Breast    Social History Social History  Substance Use Topics  . Smoking status: Never Smoker  . Smokeless tobacco: Never Used  . Alcohol use No     Allergies   Desferal [deferoxamine]; Latex; Lisinopril; Tape; and Tylenol [acetaminophen]   Review of Systems Review of Systems  Constitutional: Negative for activity change, chills and fever.       All ROS Neg except as noted in HPI  HENT: Negative for nosebleeds.   Eyes: Negative for photophobia and discharge.  Respiratory: Negative for cough, shortness of breath and wheezing.  Cardiovascular: Negative for chest pain and palpitations.  Gastrointestinal: Negative for abdominal pain, blood in stool, diarrhea, nausea and vomiting.  Genitourinary: Negative for dysuria, frequency and hematuria.  Musculoskeletal: Positive for back pain and myalgias. Negative for arthralgias and neck pain.  Skin: Negative.   Neurological: Negative for dizziness, seizures and speech difficulty.  Psychiatric/Behavioral: Negative for confusion and hallucinations.     Physical Exam Updated Vital Signs BP 143/84 (BP Location: Left Arm)   Pulse 85   Temp 98.2 F (36.8 C) (Oral)   Resp 16   Ht 5\' 3"  (1.6 m)   Wt 73 kg   SpO2 96%   BMI 28.52 kg/m   Physical Exam  Constitutional: She is oriented to person, place, and time. She appears well-developed and well-nourished.  Non-toxic appearance.  HENT:  Head: Normocephalic.  Right Ear: Tympanic membrane and external ear normal.  Left Ear: Tympanic membrane and external ear normal.  Eyes: EOM and lids are normal. Pupils are equal, round, and reactive to light.  Neck: Normal range of motion. Neck supple. Carotid bruit is not present.  Cardiovascular: Normal rate, regular rhythm, normal heart sounds, intact distal pulses and normal pulses.   Pulmonary/Chest: Breath sounds normal. No respiratory distress.  Abdominal: Soft. Bowel sounds are normal. There  is no tenderness. There is no guarding.  Musculoskeletal: Normal range of motion.  There is pain of the lower back and the lower extremities with standing in change of position. There is no pain to palpation. There no temperature changes of the lower extremities. The dorsalis pedis pulses 2+.  Lymphadenopathy:       Head (right side): No submandibular adenopathy present.       Head (left side): No submandibular adenopathy present.    She has no cervical adenopathy.  Neurological: She is alert and oriented to person, place, and time. She has normal strength. No cranial nerve deficit or sensory deficit. She exhibits normal muscle tone. Coordination normal.  Skin: Skin is warm and dry. No rash noted.  Psychiatric: She has a normal mood and affect. Her speech is normal.  Nursing note and vitals reviewed.    ED Treatments / Results  Labs (all labs ordered are listed, but only abnormal results are displayed) Labs Reviewed  COMPREHENSIVE METABOLIC PANEL  CBC WITH DIFFERENTIAL/PLATELET  URINALYSIS, ROUTINE W REFLEX MICROSCOPIC  RETICULOCYTES  MAGNESIUM    EKG  EKG Interpretation None       Radiology No results found.  Procedures Procedures (including critical care time)  Medications Ordered in ED Medications  dextrose 5 %-0.45 % sodium chloride infusion (not administered)  ketorolac (TORADOL) 30 MG/ML injection 30 mg (not administered)  diphenhydrAMINE (BENADRYL) injection 25 mg (not administered)  HYDROmorphone (DILAUDID) injection 2 mg (not administered)    Or  HYDROmorphone (DILAUDID) injection 2 mg (not administered)  HYDROmorphone (DILAUDID) injection 2 mg (not administered)    Or  HYDROmorphone (DILAUDID) injection 2 mg (not administered)  HYDROmorphone (DILAUDID) injection 2 mg (not administered)    Or  HYDROmorphone (DILAUDID) injection 2 mg (not administered)  HYDROmorphone (DILAUDID) injection 2 mg (not administered)    Or  HYDROmorphone (DILAUDID) injection 2  mg (not administered)  prochlorperazine (COMPAZINE) injection 10 mg (not administered)     Initial Impression / Assessment and Plan / ED Course  I have reviewed the triage vital signs and the nursing notes.  Pertinent labs & imaging results that were available during my care of the patient were reviewed by me and considered in  my medical decision making (see chart for details).     *I have reviewed nursing notes, vital signs, and all appropriate lab and imaging results for this patient.*  Final Clinical Impressions(s) / ED Diagnoses  MDM  Vital signs are within normal limits. Pulse oximetry is 96% on room air. Patient states that this pain feels like similar crisis events that she has had. The patient is given IV fluids, and IV pain medication. Will reassess   Lab work mostly unchanged from yesterday.  Patient states that she feels some better after IV fluids and pain medication, but pain still at a level of 6.  After additional IV Dilaudid, the patient states the pain feels as though it's arranged she can manage at home with her current medications. The patient will return to the emergency department if not improving or if any complications. Patient is in agreement with this plan.    Final diagnoses:  None    New Prescriptions New Prescriptions   No medications on file     Lily Kocher, PA-C 10/20/16 [place], MD 10/21/16 1700

## 2016-10-20 NOTE — ED Notes (Signed)
Patient made aware that a urine sample is needed.   

## 2016-10-20 NOTE — ED Triage Notes (Signed)
Patient states sickle cell crises x 1 week. States was seen here yesterday but pain intensified over night. States lower back pain that radiates to legs.

## 2016-10-20 NOTE — Discharge Instructions (Signed)
You have been treated with intravenous narcotic pain medication today. Please use caution getting around this afternoon. Please see your sickle cell specialist, or return to the emergency department if any changes, problems, or concerns.

## 2016-10-28 ENCOUNTER — Emergency Department (HOSPITAL_COMMUNITY)
Admission: EM | Admit: 2016-10-28 | Discharge: 2016-10-28 | Disposition: A | Discharge status: Home / Self Care | Payer: Medicare Other | Attending: Emergency Medicine | Admitting: Emergency Medicine

## 2016-10-28 ENCOUNTER — Encounter (HOSPITAL_COMMUNITY): Payer: Self-pay | Admitting: Emergency Medicine

## 2016-10-28 DIAGNOSIS — Z79899 Other long term (current) drug therapy: Secondary | ICD-10-CM | POA: Diagnosis not present

## 2016-10-28 DIAGNOSIS — Z7982 Long term (current) use of aspirin: Secondary | ICD-10-CM | POA: Insufficient documentation

## 2016-10-28 DIAGNOSIS — D5700 Hb-SS disease with crisis, unspecified: Secondary | ICD-10-CM

## 2016-10-28 DIAGNOSIS — Z91040 Latex allergy status: Secondary | ICD-10-CM | POA: Diagnosis not present

## 2016-10-28 LAB — CBC WITH DIFFERENTIAL/PLATELET
BASOS ABS: 0 10*3/uL (ref 0.0–0.1)
BASOS PCT: 0 %
EOS ABS: 0.3 10*3/uL (ref 0.0–0.7)
EOS PCT: 4 %
HCT: 21.5 % — ABNORMAL LOW (ref 36.0–46.0)
Hemoglobin: 8 g/dL — ABNORMAL LOW (ref 12.0–15.0)
Lymphocytes Relative: 34 %
Lymphs Abs: 2.7 10*3/uL (ref 0.7–4.0)
MCH: 37 pg — AB (ref 26.0–34.0)
MCHC: 37.2 g/dL — ABNORMAL HIGH (ref 30.0–36.0)
MCV: 99.5 fL (ref 78.0–100.0)
Monocytes Absolute: 1.2 10*3/uL — ABNORMAL HIGH (ref 0.1–1.0)
Monocytes Relative: 16 %
NEUTROS PCT: 46 %
Neutro Abs: 3.7 10*3/uL (ref 1.7–7.7)
PLATELETS: 217 10*3/uL (ref 150–400)
RBC: 2.16 MIL/uL — AB (ref 3.87–5.11)
RDW: 18.5 % — ABNORMAL HIGH (ref 11.5–15.5)
WBC: 8 10*3/uL (ref 4.0–10.5)

## 2016-10-28 LAB — COMPREHENSIVE METABOLIC PANEL
ALK PHOS: 117 U/L (ref 38–126)
ALT: 36 U/L (ref 14–54)
AST: 103 U/L — ABNORMAL HIGH (ref 15–41)
Albumin: 2.4 g/dL — ABNORMAL LOW (ref 3.5–5.0)
Anion gap: 6 (ref 5–15)
BUN: 6 mg/dL (ref 6–20)
CALCIUM: 8.4 mg/dL — AB (ref 8.9–10.3)
CO2: 23 mmol/L (ref 22–32)
Chloride: 107 mmol/L (ref 101–111)
Creatinine, Ser: 0.46 mg/dL (ref 0.44–1.00)
GFR calc Af Amer: >60 mL/min (ref >60)
GFR calc non Af Amer: >60 mL/min (ref >60)
Glucose, Bld: 123 mg/dL — ABNORMAL HIGH (ref 65–99)
POTASSIUM: 3.1 mmol/L — AB (ref 3.5–5.1)
SODIUM: 136 mmol/L (ref 135–145)
Total Bilirubin: 11.3 mg/dL — ABNORMAL HIGH (ref 0.3–1.2)
Total Protein: 7 g/dL (ref 6.5–8.1)

## 2016-10-28 LAB — RETICULOCYTES
RBC.: 2.16 MIL/uL — AB (ref 3.87–5.11)
RETIC COUNT ABSOLUTE: 311 10*3/uL — AB (ref 19.0–186.0)
Retic Ct Pct: 14.4 % — ABNORMAL HIGH (ref 0.4–3.1)

## 2016-10-28 MED ORDER — HYDROMORPHONE HCL 2 MG/ML IJ SOLN
2.0000 mg | INTRAMUSCULAR | Status: AC
  Administered 2016-10-28: 2 mg via INTRAVENOUS
  Filled 2016-10-28: qty 1

## 2016-10-28 MED ORDER — KETOROLAC TROMETHAMINE 30 MG/ML IJ SOLN
30.0000 mg | INTRAMUSCULAR | Status: AC
  Administered 2016-10-28: 30 mg via INTRAVENOUS
  Filled 2016-10-28: qty 1

## 2016-10-28 MED ORDER — HYDROMORPHONE HCL 2 MG/ML IJ SOLN: 2.0000 mg | INTRAMUSCULAR | Status: AC

## 2016-10-28 MED ORDER — HEPARIN SOD (PORK) LOCK FLUSH 100 UNIT/ML IV SOLN
INTRAVENOUS | Status: AC
  Administered 2016-10-28: 500 [IU]
  Filled 2016-10-28: qty 5

## 2016-10-28 MED ORDER — ONDANSETRON HCL 4 MG/2ML IJ SOLN
4.0000 mg | INTRAMUSCULAR | Status: DC | PRN
  Filled 2016-10-28: qty 2

## 2016-10-28 MED ORDER — SODIUM CHLORIDE 0.45 % IV SOLN
INTRAVENOUS | Status: DC
  Administered 2016-10-28: 19:00:00 via INTRAVENOUS

## 2016-10-28 MED ORDER — HEPARIN SOD (PORK) LOCK FLUSH 100 UNIT/ML IV SOLN: 500.0000 [IU] | Freq: Once | INTRAVENOUS | Status: DC

## 2016-10-28 MED ORDER — HYDROMORPHONE HCL 2 MG/ML IJ SOLN
2.0000 mg | Freq: Once | INTRAMUSCULAR | Status: AC
Start: 2016-10-28 — End: 2016-10-28
  Administered 2016-10-28: 2 mg via INTRAVENOUS
  Filled 2016-10-28: qty 1

## 2016-10-28 MED ORDER — DIPHENHYDRAMINE HCL 25 MG PO CAPS
25.0000 mg | ORAL_CAPSULE | ORAL | Status: DC | PRN
  Filled 2016-10-28: qty 2

## 2016-10-28 NOTE — ED Provider Notes (Signed)
[facility] DEPT Provider Note   CSN: [id number] Arrival date & time: 10/28/16  1723     History   Chief Complaint Chief Complaint  Patient presents with  . Sickle Cell Pain Crisis    HPI Dawn [name] is a 36 y.o. female.  HPI 36 year old female who presents with sickle cell pain crisis. She has a history of sickle cell anemia complicated by pulmonary hypertension. Onset of her typical sickle cell pain starting 2 days ago not relieved by home MS Contin and oxycodone. Last dose was at 3 PM today. States pain in her low back and in bilateral legs, where she typically has her sickle cell pain crises. No fever, chest pain, shortness of breath, cough, nausea or vomiting, diarrhea, abdominal pain, leg swelling, dysuria or urinary frequency. Thinks recent stress has put her in crisis today.   Past Medical History:  Diagnosis Date  . Blood transfusion   . Demand ischemia ([place]) 02/06/2016  . HCAP (healthcare-associated pneumonia) 05/19/2013  . 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 Pulmonary hypertension 02/06/2016   49 mmHg per echo on 02/06/16   . 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 Sickle cell anemia (HCC)   . Sickle cell disease ([place])   . 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 Vitamin B12 deficiency 07/07/2015    Patient Active Problem List   Diagnosis Date Noted  . Syncope 10/17/2016  . Dizziness   . Hypomagnesemia   . Swelling of right upper extremity   . 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 Positive D dimer   . Pulmonary hypertension 02/16/2016  . 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 Anemia of chronic disease   . Chronic pain syndrome 10/14/2015  . 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 Vitamin B12 deficiency 07/07/2015  . Thrombocytopenia ([place]) 07/06/2015  . Hyperbilirubinemia 09/11/2014  . Macrocytosis 06/13/2014  . Vitamin D deficiency 11/23/2013  . 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  Hypokalemia 03/28/2012  . 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 Elevated LFTs 04/27/2011  . Sickle cell disease ([place]) 04/26/2011    Past Surgical History:  Procedure Laterality Date  .  left knee ACL reconstruction    . CESAREAN SECTION     x 2  . CHOLECYSTECTOMY    . IR GENERIC HISTORICAL  07/17/2016   IR FLUORO GUIDE PORT INSERTION RIGHT 07/17/2016 Aletta Edouard, MD [facility]  . IR GENERIC HISTORICAL  07/17/2016   IR US GUIDE VASC ACCESS RIGHT 07/17/2016 Aletta Edouard, MD [facility]  . IR GENERIC HISTORICAL  10/12/2016   IR PTA VENOUS EXCEPT DIALYSIS CIRCUIT 10/12/2016 Arne Cleveland, MD [facility]  . IR GENERIC HISTORICAL  10/12/2016   IR US GUIDE VASC ACCESS RIGHT 10/12/2016 Arne Cleveland, MD [facility]  . IR GENERIC HISTORICAL  10/12/2016   IR VENO/EXT/UNI RIGHT 10/12/2016 Arne Cleveland, MD [facility]  . PORT-A-CATH REMOVAL Left 07/29/2013   Procedure: REMOVAL PORT-A-CATH;  Surgeon: Scherry Ran, MD;  Location: [facility];  Service: General;  Laterality: Left;  . PORTACATH PLACEMENT    . PORTACATH PLACEMENT Left 02/23/2013   Procedure: INSERTION PORT-A-CATH;  Surgeon: Donato Heinz, MD;  Location: [facility];  Service: General;  Laterality: Left;  . TEE WITHOUT CARDIOVERSION N/A 08/07/2013   Procedure: TRANSESOPHAGEAL ECHOCARDIOGRAM (TEE);  Surgeon: Arnoldo Lenis, MD;  Location: [facility];  Service: Cardiology;  Laterality: N/A;    OB History    Gravida Para Term Preterm  AB Living   1 1   1   1    SAB TAB Ectopic Multiple Live Births                   Home Medications    Prior to Admission medications   Medication Sig Start Date End Date Taking? Authorizing Provider  aspirin EC 81 MG EC tablet Take 1 tablet (81 mg total) by mouth daily. 10/18/16  Yes Leana Gamer, MD  folic acid (FOLVITE) 1 MG tablet Take 1 tablet (1 mg total) by mouth daily. 03/14/16  Yes [name], FNP  hydroxyurea (HYDREA) 500 MG capsule Take 2 capsules  (1,000 mg total) by mouth daily. May take with food to minimize GI side effects. 03/14/16  Yes [name], FNP  levonorgestrel (MIRENA) 20 MCG/24HR IUD 1 each by Intrauterine route once.   Yes Historical Provider, MD  loratadine (CLARITIN) 10 MG tablet Take 10 mg by mouth daily.   Yes Historical Provider, MD  morphine (MS CONTIN) 15 MG 12 hr tablet Take 15 mg by mouth every 12 (twelve) hours.   Yes Historical Provider, MD  Multiple Vitamin (MULTIVITAMIN WITH MINERALS) TABS tablet Take 1 tablet by mouth daily.   Yes Historical Provider, MD  Oxycodone HCl 10 MG TABS Take 1 tablet (10 mg total) by mouth every 6 (six) hours as needed. 09/25/16  Yes Kinnie Feil, PA-C  promethazine (PHENERGAN) 12.5 MG tablet Take 1 tablet (12.5 mg total) by mouth every 6 (six) hours as needed for nausea or vomiting. 09/25/16  Yes Kinnie Feil, PA-C  Vitamin D, Ergocalciferol, (DRISDOL) 50000 units CAPS capsule Take 1 capsule (50,000 Units total) by mouth every 7 (seven) days. Takes on Sundays. 06/01/16  Yes Micheline Chapman, NP  magnesium oxide (MAG-OX) 400 (241.3 Mg) MG tablet Take 1 tablet (400 mg total) by mouth daily. Patient not taking: Reported on 10/28/2016 10/15/16   Leana Gamer, MD  potassium chloride SA (K-DUR,KLOR-CON) 20 MEQ tablet Take 2 tablets (40 mEq total) by mouth daily. Patient not taking: Reported on 10/28/2016 10/15/16   Leana Gamer, MD    Family History Family History  Problem Relation Age of Onset  . Sickle cell trait Mother   . Sickle cell trait Father   . Hypertension Father   . Diabetes Father   . Sickle cell trait Sister   . Cancer Paternal Aunt     Breast    Social History Social History  Substance Use Topics  . Smoking status: Never Smoker  . Smokeless tobacco: Never Used  . Alcohol use No     Allergies   Desferal [deferoxamine]; Latex; Lisinopril; Tape; and Tylenol [acetaminophen]   Review of Systems Review of Systems 10/14 systems reviewed and are  negative other than those stated in the HPI   Physical Exam Updated Vital Signs BP 141/78 (BP Location: Left Arm)   Pulse 82   Temp 98.3 F (36.8 C) (Oral)   Resp 18   Ht 5\' 3"  (1.6 m)   Wt 161 lb (73 kg)   SpO2 98%   BMI 28.52 kg/m   Physical Exam  Physical Exam  Nursing note and vitals reviewed. Constitutional: Well developed, well nourished, non-toxic, and in no acute distress Head: Normocephalic and atraumatic.  Mouth/Throat: Oropharynx is clear and moist.  Eyes: bilateral scleral icterus Neck: Normal range of motion. Neck supple.  Cardiovascular: Normal rate and regular rhythm.   Pulmonary/Chest: Effort normal and breath sounds normal.  Abdominal: Soft. There is no tenderness. There is no rebound and no guarding.  Musculoskeletal: Normal range of motion.  Neurological: Alert, no facial droop, fluent speech, moves all extremities symmetrically Skin: Skin is warm and dry.  Psychiatric: Cooperative    ED Treatments / Results  Labs (all labs ordered are listed, but only abnormal results are displayed) Labs Reviewed  CBC WITH DIFFERENTIAL/PLATELET - Abnormal; Notable for the following:       Result Value   RBC 2.16 (*)    Hemoglobin 8.0 (*)    HCT 21.5 (*)    MCH 37.0 (*)    MCHC 37.2 (*)    RDW 18.5 (*)    Monocytes Absolute 1.2 (*)    All other components within normal limits  RETICULOCYTES - Abnormal; Notable for the following:    Retic Ct Pct 14.4 (*)    RBC. 2.16 (*)    Retic Count, Manual 311.0 (*)    All other components within normal limits  COMPREHENSIVE METABOLIC PANEL - Abnormal; Notable for the following:    Potassium 3.1 (*)    Glucose, Bld 123 (*)    Calcium 8.4 (*)    Albumin 2.4 (*)    AST 103 (*)    Total Bilirubin 11.3 (*)    All other components within normal limits  POC URINE PREG, ED    EKG  EKG Interpretation None       Radiology No results found.  Procedures Procedures (including critical care time)  Medications  Ordered in ED Medications  0.45 % sodium chloride infusion ( Intravenous New Bag/Given 10/28/16 1837)  diphenhydrAMINE (BENADRYL) capsule 25-50 mg (not administered)  ondansetron (ZOFRAN) injection 4 mg (not administered)  ketorolac (TORADOL) 30 MG/ML injection 30 mg (30 mg Intravenous Given 10/28/16 1811)  HYDROmorphone (DILAUDID) injection 2 mg (2 mg Intravenous Given 10/28/16 1811)    Or  HYDROmorphone (DILAUDID) injection 2 mg ( Subcutaneous See Alternative 10/28/16 1811)  HYDROmorphone (DILAUDID) injection 2 mg (2 mg Intravenous Given 10/28/16 1908)    Or  HYDROmorphone (DILAUDID) injection 2 mg ( Subcutaneous See Alternative 10/28/16 1908)  HYDROmorphone (DILAUDID) injection 2 mg (2 mg Intravenous Given 10/28/16 1945)    Or  HYDROmorphone (DILAUDID) injection 2 mg ( Subcutaneous See Alternative 10/28/16 1945)  HYDROmorphone (DILAUDID) injection 2 mg (2 mg Intravenous Given 10/28/16 2009)     Initial Impression / Assessment and Plan / ED Course  I have reviewed the triage vital signs and the nursing notes.  Pertinent labs & imaging results that were available during my care of the patient were reviewed by me and considered in my medical decision making (see chart for details).     History of sickle cell anemia who presents with reported typical sickle cell pain crisis. Vital signs stable. She is well appearing and in no acute distress. Blood work shows stable anemia with appropriate reticulocyte count. No concerns for infection, acute chest, or other serious complications at this time. Patient received IV analgesics, and does feel improved and feels comfortable managing symptoms from home. Strict return and follow-up instructions reviewed. She expressed understanding of all discharge instructions and felt comfortable with the plan of care.       Final Clinical Impressions(s) / ED Diagnoses   Final diagnoses:  Sickle cell pain crisis [facility])    New Prescriptions New Prescriptions   No  medications on file     Forde Dandy, MD 10/28/16 2053

## 2016-10-28 NOTE — Discharge Instructions (Signed)
Please follow-up with Dr. Doreene Burke as scheduled. Continue home medications  Return for worsening symptoms, including fever, difficulty breathing, chest pain or any other symptoms concerning to you.

## 2016-10-28 NOTE — ED Triage Notes (Signed)
Patient c/o lower back pain and bilateral leg pain. Patient is a sickle cell patient. Per patient started on Friday. Denies any fevers.

## 2016-10-29 DIAGNOSIS — Z79899 Other long term (current) drug therapy: Secondary | ICD-10-CM | POA: Diagnosis not present

## 2016-10-29 DIAGNOSIS — D57219 Sickle-cell/Hb-C disease with crisis, unspecified: Secondary | ICD-10-CM | POA: Diagnosis not present

## 2016-10-29 DIAGNOSIS — Z7982 Long term (current) use of aspirin: Secondary | ICD-10-CM | POA: Insufficient documentation

## 2016-10-29 DIAGNOSIS — Z91040 Latex allergy status: Secondary | ICD-10-CM | POA: Diagnosis not present

## 2016-10-29 DIAGNOSIS — M545 Low back pain: Secondary | ICD-10-CM | POA: Diagnosis present

## 2016-10-29 DIAGNOSIS — D5700 Hb-SS disease with crisis, unspecified: Secondary | ICD-10-CM | POA: Diagnosis not present

## 2016-10-30 ENCOUNTER — Encounter (HOSPITAL_COMMUNITY): Payer: Self-pay | Admitting: *Deleted

## 2016-10-30 ENCOUNTER — Emergency Department (HOSPITAL_COMMUNITY)
Admission: EM | Admit: 2016-10-30 | Discharge: 2016-10-30 | Disposition: A | Discharge status: Home / Self Care | Payer: Medicare Other | Attending: Emergency Medicine | Admitting: Emergency Medicine

## 2016-10-30 DIAGNOSIS — D5700 Hb-SS disease with crisis, unspecified: Secondary | ICD-10-CM

## 2016-10-30 LAB — COMPREHENSIVE METABOLIC PANEL
ALBUMIN: 2.4 g/dL — AB (ref 3.5–5.0)
ALK PHOS: 112 U/L (ref 38–126)
ALT: 41 U/L (ref 14–54)
AST: 109 U/L — AB (ref 15–41)
Anion gap: 7 (ref 5–15)
BUN: 7 mg/dL (ref 6–20)
CALCIUM: 8.1 mg/dL — AB (ref 8.9–10.3)
CHLORIDE: 108 mmol/L (ref 101–111)
CO2: 24 mmol/L (ref 22–32)
CREATININE: 0.52 mg/dL (ref 0.44–1.00)
GFR calc non Af Amer: >60 mL/min (ref >60)
GLUCOSE: 98 mg/dL (ref 65–99)
Potassium: 3.3 mmol/L — ABNORMAL LOW (ref 3.5–5.1)
Sodium: 139 mmol/L (ref 135–145)
Total Bilirubin: 12.5 mg/dL — ABNORMAL HIGH (ref 0.3–1.2)
Total Protein: 7 g/dL (ref 6.5–8.1)

## 2016-10-30 LAB — CBC WITH DIFFERENTIAL/PLATELET
BASOS PCT: 1 %
Basophils Absolute: 0.1 10*3/uL (ref 0.0–0.1)
Eosinophils Absolute: 0.3 10*3/uL (ref 0.0–0.7)
Eosinophils Relative: 3 %
HEMATOCRIT: 20.6 % — AB (ref 36.0–46.0)
HEMOGLOBIN: 7.5 g/dL — AB (ref 12.0–15.0)
LYMPHS ABS: 2.3 10*3/uL (ref 0.7–4.0)
LYMPHS PCT: 25 %
MCH: 35.9 pg — AB (ref 26.0–34.0)
MCHC: 36.4 g/dL — ABNORMAL HIGH (ref 30.0–36.0)
MCV: 98.6 fL (ref 78.0–100.0)
MONO ABS: 1.8 10*3/uL — AB (ref 0.1–1.0)
MONOS PCT: 20 %
NEUTROS ABS: 4.6 10*3/uL (ref 1.7–7.7)
NEUTROS PCT: 51 %
Platelets: 198 10*3/uL (ref 150–400)
RBC: 2.09 MIL/uL — ABNORMAL LOW (ref 3.87–5.11)
RDW: 18.5 % — ABNORMAL HIGH (ref 11.5–15.5)
SMEAR REVIEW: ADEQUATE
WBC: 9 10*3/uL (ref 4.0–10.5)

## 2016-10-30 LAB — RETICULOCYTES
RBC.: 2.09 MIL/uL — ABNORMAL LOW (ref 3.87–5.11)
RETIC COUNT ABSOLUTE: 374.1 10*3/uL — AB (ref 19.0–186.0)
Retic Ct Pct: 17.9 % — ABNORMAL HIGH (ref 0.4–3.1)

## 2016-10-30 MED ORDER — SODIUM CHLORIDE 0.9 % IV BOLUS (SEPSIS)
1000.0000 mL | Freq: Once | INTRAVENOUS | Status: AC
  Administered 2016-10-30: 1000 mL via INTRAVENOUS

## 2016-10-30 MED ORDER — HYDROMORPHONE HCL 1 MG/ML IJ SOLN
1.0000 mg | INTRAMUSCULAR | Status: AC | PRN
  Administered 2016-10-30 (×3): 1 mg via INTRAVENOUS
  Filled 2016-10-30 (×3): qty 1

## 2016-10-30 MED ORDER — OXYCODONE-ACETAMINOPHEN 5-325 MG PO TABS
2.0000 | ORAL_TABLET | Freq: Once | ORAL | Status: AC
  Administered 2016-10-30: 2 via ORAL
  Filled 2016-10-30: qty 2

## 2016-10-30 MED ORDER — KETOROLAC TROMETHAMINE 30 MG/ML IJ SOLN
30.0000 mg | Freq: Once | INTRAMUSCULAR | Status: AC
  Administered 2016-10-30: 30 mg via INTRAVENOUS
  Filled 2016-10-30: qty 1

## 2016-10-30 NOTE — ED Triage Notes (Signed)
Pt c/o pain to back and legs

## 2016-10-30 NOTE — ED Provider Notes (Signed)
[facility] DEPT Provider Note   CSN: [id number] Arrival date & time: 10/29/16  2337    By signing my name below, I, Macon Large, attest that this documentation has been prepared under the direction and in the presence of [name], MD. Electronically Signed: Macon Large, ED Scribe. 10/30/16. 12:50 AM.  History   Chief Complaint Chief Complaint  Patient presents with  . Sickle Cell Pain Crisis   The history is provided by the patient. No language interpreter was used.   HPI Comments: Dawn [name] is a 36 y.o. female who presents to the Emergency Department presenting with gradually worsening sickle cell pain crisis onset yesterday. Pt reports associated lower back pain that radiates to her bilateral legs. Pt was seen yesterday in the ED for similar symptoms. She had labs completed and treated with IV analgesics, bringing her 10/10 pain down to a 5/10. Pt was told to return to ED if symptoms worsened. Pt notes taking oxycodone and MS Contin at home with no significant relief. She denies fever, CP, SOB, cough.   Past Medical History:  Diagnosis Date  . Blood transfusion   . Demand ischemia ([place]) 02/06/2016  . HCAP (healthcare-associated pneumonia) 05/19/2013  . 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 Pulmonary hypertension 02/06/2016   49 mmHg per echo on 02/06/16   . 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 Sickle cell anemia (HCC)   . Sickle cell disease ([place])   . 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 Vitamin B12 deficiency 07/07/2015    Patient Active Problem List   Diagnosis Date Noted  . Syncope 10/17/2016  . Dizziness   . Hypomagnesemia   . Swelling of right upper extremity   . 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 Positive D dimer   . Pulmonary hypertension 02/16/2016  . 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 Anemia of chronic disease   . Chronic pain syndrome 10/14/2015  . 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 Vitamin B12 deficiency 07/07/2015  .  Thrombocytopenia ([place]) 07/06/2015  . Hyperbilirubinemia 09/11/2014  . Macrocytosis 06/13/2014  . Vitamin D deficiency 11/23/2013  . 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 Hypokalemia 03/28/2012  . 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 Elevated LFTs 04/27/2011  . Sickle cell disease ([place]) 04/26/2011    Past Surgical History:  Procedure Laterality Date  .  left knee ACL reconstruction    . CESAREAN SECTION     x 2  . CHOLECYSTECTOMY    . IR GENERIC HISTORICAL  07/17/2016   IR FLUORO GUIDE PORT INSERTION RIGHT 07/17/2016 Aletta Edouard, MD [facility]  . IR GENERIC HISTORICAL  07/17/2016   IR US GUIDE VASC ACCESS RIGHT 07/17/2016 Aletta Edouard, MD [facility]  . IR GENERIC HISTORICAL  10/12/2016   IR PTA VENOUS EXCEPT DIALYSIS CIRCUIT 10/12/2016 Arne Cleveland, MD [facility]  . IR GENERIC HISTORICAL  10/12/2016   IR US GUIDE VASC ACCESS RIGHT 10/12/2016 Arne Cleveland, MD [facility]  . IR GENERIC HISTORICAL  10/12/2016   IR VENO/EXT/UNI RIGHT 10/12/2016 Arne Cleveland, MD [facility]  . PORT-A-CATH REMOVAL Left 07/29/2013   Procedure: REMOVAL PORT-A-CATH;  Surgeon: Scherry Ran, MD;  Location: [facility];  Service: General;  Laterality: Left;  . PORTACATH PLACEMENT    . PORTACATH PLACEMENT Left 02/23/2013   Procedure: INSERTION PORT-A-CATH;  Surgeon: Donato Heinz, MD;  Location: [facility];  Service: General;  Laterality: Left;  . TEE WITHOUT CARDIOVERSION N/A 08/07/2013  Procedure: TRANSESOPHAGEAL ECHOCARDIOGRAM (TEE);  Surgeon: Arnoldo Lenis, MD;  Location[facility];  Service: Cardiology;  Laterality: N/A;    OB History    Gravida Para Term Preterm AB Living   1 1   1   1    SAB TAB Ectopic Multiple Live Births                   Home Medications    Prior to Admission medications   Medication Sig Start Date End Date Taking? Authorizing Provider  aspirin EC 81 MG EC tablet Take 1  tablet (81 mg total) by mouth daily. 10/18/16   Leana Gamer, MD  folic acid (FOLVITE) 1 MG tablet Take 1 tablet (1 mg total) by mouth daily. 03/14/16   [name], FNP  hydroxyurea (HYDREA) 500 MG capsule Take 2 capsules (1,000 mg total) by mouth daily. May take with food to minimize GI side effects. 03/14/16   [name], FNP  levonorgestrel (MIRENA) 20 MCG/24HR IUD 1 each by Intrauterine route once.    Historical Provider, MD  loratadine (CLARITIN) 10 MG tablet Take 10 mg by mouth daily.    Historical Provider, MD  magnesium oxide (MAG-OX) 400 (241.3 Mg) MG tablet Take 1 tablet (400 mg total) by mouth daily. Patient not taking: Reported on 10/28/2016 10/15/16   Leana Gamer, MD  morphine (MS CONTIN) 15 MG 12 hr tablet Take 15 mg by mouth every 12 (twelve) hours.    Historical Provider, MD  Multiple Vitamin (MULTIVITAMIN WITH MINERALS) TABS tablet Take 1 tablet by mouth daily.    Historical Provider, MD  Oxycodone HCl 10 MG TABS Take 1 tablet (10 mg total) by mouth every 6 (six) hours as needed. 09/25/16   Kinnie Feil, PA-C  potassium chloride SA (K-DUR,KLOR-CON) 20 MEQ tablet Take 2 tablets (40 mEq total) by mouth daily. Patient not taking: Reported on 10/28/2016 10/15/16   Leana Gamer, MD  promethazine (PHENERGAN) 12.5 MG tablet Take 1 tablet (12.5 mg total) by mouth every 6 (six) hours as needed for nausea or vomiting. 09/25/16   Kinnie Feil, PA-C  Vitamin D, Ergocalciferol, (DRISDOL) 50000 units CAPS capsule Take 1 capsule (50,000 Units total) by mouth every 7 (seven) days. Takes on Sundays. 06/01/16   Micheline Chapman, NP    Family History Family History  Problem Relation Age of Onset  . Sickle cell trait Mother   . Sickle cell trait Father   . Hypertension Father   . Diabetes Father   . Sickle cell trait Sister   . Cancer Paternal Aunt     Breast    Social History Social History  Substance Use Topics  . Smoking status: Never Smoker  . Smokeless  tobacco: Never Used  . Alcohol use No     Allergies   Desferal [deferoxamine]; Latex; Lisinopril; Tape; and Tylenol [acetaminophen]   Review of Systems Review of Systems  Constitutional: Negative for fever.  Respiratory: Negative for shortness of breath.   Cardiovascular: Negative for chest pain.  Musculoskeletal: Positive for arthralgias and back pain.     Physical Exam Updated Vital Signs BP 138/90 (BP Location: Right Arm)   Pulse 91   Temp 98.4 F (36.9 C) (Oral)   Resp 20   Ht 5\' 3"  (1.6 m)   Wt 161 lb (73 kg)   SpO2 97%   BMI 28.52 kg/m   Physical Exam  Constitutional: She is oriented to person, place, and time.  She appears well-developed and well-nourished.  HENT:  Head: Normocephalic and atraumatic.  Cardiovascular: Normal rate, regular rhythm and normal heart sounds.   Pulmonary/Chest: Effort normal. No respiratory distress. She has no wheezes.  Port palpated proper chest, extensive scarring noted  Abdominal: Soft. Bowel sounds are normal. There is no tenderness. There is no guarding.  Neurological: She is alert and oriented to person, place, and time.  Skin: Skin is warm and dry.  Psychiatric: She has a normal mood and affect.  Nursing note and vitals reviewed.    ED Treatments / Results   DIAGNOSTIC STUDIES: Oxygen Saturation is 97% on RA, normal by my interpretation.    COORDINATION OF CARE: 12:35 AM Discussed treatment plan with pt at bedside and pt agreed to plan.   Labs (all labs ordered are listed, but only abnormal results are displayed) Labs Reviewed  CBC WITH DIFFERENTIAL/PLATELET - Abnormal; Notable for the following:       Result Value   RBC 2.09 (*)    Hemoglobin 7.5 (*)    HCT 20.6 (*)    MCH 35.9 (*)    MCHC 36.4 (*)    RDW 18.5 (*)    Monocytes Absolute 1.8 (*)    All other components within normal limits  RETICULOCYTES - Abnormal; Notable for the following:    Retic Ct Pct 17.9 (*)    RBC. 2.09 (*)    Retic Count, Manual  374.1 (*)    All other components within normal limits  COMPREHENSIVE METABOLIC PANEL - Abnormal; Notable for the following:    Potassium 3.3 (*)    Calcium 8.1 (*)    Albumin 2.4 (*)    AST 109 (*)    Total Bilirubin 12.5 (*)    All other components within normal limits    EKG  EKG Interpretation None       Radiology No results found.  Procedures Procedures (including critical care time)  Medications Ordered in ED Medications  sodium chloride 0.9 % bolus 1,000 mL (0 mLs Intravenous Stopped 10/30/16 0300)  ketorolac (TORADOL) 30 MG/ML injection 30 mg (30 mg Intravenous Given 10/30/16 0100)  HYDROmorphone (DILAUDID) injection 1 mg (1 mg Intravenous Given 10/30/16 0202)  oxyCODONE-acetaminophen (PERCOCET/ROXICET) 5-325 MG per tablet 2 tablet (2 tablets Oral Given 10/30/16 0254)     Initial Impression / Assessment and Plan / ED Course  I have reviewed the triage vital signs and the nursing notes.  Pertinent labs & imaging results that were available during my care of the patient were reviewed by me and considered in my medical decision making (see chart for details).     Patient presents with pain consistent with sickle crisis. Was seen and evaluated yesterday for the same. Pain score back to 8. She was discharged with pain score 5. She is otherwise nontoxic. No infectious symptoms. Lab work obtained. Patient given 3 doses of all on it, Toradol. Lab work notable for mild decrease in hemoglobin to 7.5. Baseline between 7 and 8. 8.0 yesterday. On recheck, patient reports pain has improved. She does feel like she can go home. She was dosed one dose of oral Percocet prior to discharge.  After history, exam, and medical workup I feel the patient has been appropriately medically screened and is safe for discharge home. Pertinent diagnoses were discussed with the patient. Patient was given return precautions.   Final Clinical Impressions(s) / ED Diagnoses   Final diagnoses:  Sickle cell  pain crisis [facility])    New Prescriptions  Discharge Medication List as of 10/30/2016  2:45 AM     I personally performed the services described in this documentation, which was scribed in my presence. The recorded information has been reviewed and is accurate.     [name], MD 10/30/16 [phone]

## 2016-10-31 ENCOUNTER — Telehealth (HOSPITAL_COMMUNITY): Payer: Self-pay | Admitting: *Deleted

## 2016-10-31 ENCOUNTER — Telehealth: Payer: Self-pay

## 2016-10-31 ENCOUNTER — Non-Acute Institutional Stay (HOSPITAL_COMMUNITY)
Admission: AD | Admit: 2016-10-31 | Discharge: 2016-10-31 | Disposition: A | Discharge status: Home / Self Care | Payer: Medicare Other | Source: Ambulatory Visit | Attending: Internal Medicine | Admitting: Internal Medicine

## 2016-10-31 DIAGNOSIS — Z79891 Long term (current) use of opiate analgesic: Secondary | ICD-10-CM | POA: Diagnosis not present

## 2016-10-31 DIAGNOSIS — D5700 Hb-SS disease with crisis, unspecified: Secondary | ICD-10-CM | POA: Insufficient documentation

## 2016-10-31 DIAGNOSIS — Z79899 Other long term (current) drug therapy: Secondary | ICD-10-CM | POA: Diagnosis not present

## 2016-10-31 DIAGNOSIS — Z7982 Long term (current) use of aspirin: Secondary | ICD-10-CM | POA: Diagnosis not present

## 2016-10-31 DIAGNOSIS — M545 Low back pain: Secondary | ICD-10-CM | POA: Diagnosis present

## 2016-10-31 LAB — BASIC METABOLIC PANEL
Anion gap: 8 (ref 5–15)
BUN: 6 mg/dL (ref 6–20)
CHLORIDE: 109 mmol/L (ref 101–111)
CO2: 22 mmol/L (ref 22–32)
Calcium: 8.3 mg/dL — ABNORMAL LOW (ref 8.9–10.3)
Creatinine, Ser: 0.6 mg/dL (ref 0.44–1.00)
GFR calc non Af Amer: >60 mL/min (ref >60)
Glucose, Bld: 106 mg/dL — ABNORMAL HIGH (ref 65–99)
POTASSIUM: 3.4 mmol/L — AB (ref 3.5–5.1)
SODIUM: 139 mmol/L (ref 135–145)

## 2016-10-31 LAB — HEMOGLOBIN AND HEMATOCRIT, BLOOD
HCT: 20.6 % — ABNORMAL LOW (ref 36.0–46.0)
Hemoglobin: 7.6 g/dL — ABNORMAL LOW (ref 12.0–15.0)

## 2016-10-31 MED ORDER — HEPARIN SOD (PORK) LOCK FLUSH 100 UNIT/ML IV SOLN
500.0000 [IU] | INTRAVENOUS | Status: AC | PRN
  Administered 2016-10-31: 500 [IU]
  Filled 2016-10-31: qty 5

## 2016-10-31 MED ORDER — HYDROMORPHONE 1 MG/ML IV SOLN
INTRAVENOUS | Status: DC
  Administered 2016-10-31: 11:00:00 via INTRAVENOUS
  Administered 2016-10-31: 11.5 mg via INTRAVENOUS
  Filled 2016-10-31: qty 25

## 2016-10-31 MED ORDER — ONDANSETRON HCL 4 MG/2ML IJ SOLN: 4.0000 mg | Freq: Four times a day (QID) | INTRAMUSCULAR | Status: DC | PRN

## 2016-10-31 MED ORDER — KETOROLAC TROMETHAMINE 30 MG/ML IJ SOLN
30.0000 mg | Freq: Once | INTRAMUSCULAR | Status: AC
  Administered 2016-10-31: 30 mg via INTRAVENOUS
  Filled 2016-10-31: qty 1

## 2016-10-31 MED ORDER — NALOXONE HCL 0.4 MG/ML IJ SOLN: 0.4000 mg | INTRAMUSCULAR | Status: DC | PRN

## 2016-10-31 MED ORDER — SODIUM CHLORIDE 0.9% FLUSH: 9.0000 mL | INTRAVENOUS | Status: DC | PRN

## 2016-10-31 MED ORDER — SODIUM CHLORIDE 0.9% FLUSH
10.0000 mL | INTRAVENOUS | Status: AC | PRN
  Administered 2016-10-31: 10 mL

## 2016-10-31 MED ORDER — DEXTROSE-NACL 5-0.45 % IV SOLN
INTRAVENOUS | Status: DC
  Administered 2016-10-31: 11:00:00 via INTRAVENOUS

## 2016-10-31 MED ORDER — SODIUM CHLORIDE 0.9 % IV SOLN
25.0000 mg | INTRAVENOUS | Status: DC | PRN
  Filled 2016-10-31: qty 0.5

## 2016-10-31 MED ORDER — DIPHENHYDRAMINE HCL 25 MG PO CAPS: 25.0000 mg | ORAL_CAPSULE | ORAL | Status: DC | PRN

## 2016-10-31 NOTE — Progress Notes (Signed)
Pt received at the [facility] for treatment. Pt states her pain is 9/10 and it was in her back and legs. Pt was treated with IV fluids, Dilaudid PCA and Toradol. Pt's pain # was 4/10 at discharge. Pt was alert,oriented and ambulatory at discharge. D/C instructions given with verbal understanding.Dawn [name]

## 2016-10-31 NOTE — Telephone Encounter (Signed)
Patient called seeking treatment at the [facility]. Patient c/o back and leg pain and rates it 9/10 on pain scale. Patient denies fever, chest pain, N/V/D or abdominal pain. Patient reports last taking her home medications (Oxycononde and MS Cotin) around 7am this morning with no  Relief. Patient reports taking meds around the clock and hydrating.    Patient placed on a brief how and provider L. Smith Robert was notified. Patient advised to come to the day hospital for treatment per L. Hollis. Patient states an understanding.

## 2016-10-31 NOTE — H&P (Signed)
[facility] History and Physical   Date: 10/31/2016  Patient name: Dawn [name] Medical record number: [id number] Date of birth: 25-Nov-1980 Age: 36 y.o. Gender: female PCP: Angelica Chessman, MD  Attending physician: Tresa Garter, MD  Chief Complaint: Lower back and lower extremities  History of Present Illness: [name], a 36 year old female with a history of sickle cell anemia, HbSS presents complaining of pain to lower back and extremities consistent with sickle cell anemia. Patient attributes current pain crisis to changes in weather. Current pain intensity is 10/10 described as constant and throbbing. Pain unrelieved by MS Contin and Oxycodone last taken around 7 am. She has been taking all other medications consistently. She denies chest pain, shortness of breath, paresthesias, headache, dysuria, nausea, vomiting or diarrhea.   Meds: Prescriptions Prior to Admission  Medication Sig Dispense Refill Last Dose  . aspirin EC 81 MG EC tablet Take 1 tablet (81 mg total) by mouth daily. 30 tablet 0 10/31/2016 at Unknown time  . folic acid (FOLVITE) 1 MG tablet Take 1 tablet (1 mg total) by mouth daily. 30 tablet 11 10/31/2016 at Unknown time  . hydroxyurea (HYDREA) 500 MG capsule Take 2 capsules (1,000 mg total) by mouth daily. May take with food to minimize GI side effects. 60 capsule 11 10/31/2016 at Unknown time  . levonorgestrel (MIRENA) 20 MCG/24HR IUD 1 each by Intrauterine route once.   10/31/2016 at Unknown time  . loratadine (CLARITIN) 10 MG tablet Take 10 mg by mouth daily.   10/31/2016 at Unknown time  . magnesium oxide (MAG-OX) 400 (241.3 Mg) MG tablet Take 1 tablet (400 mg total) by mouth daily. 30 tablet 1 10/31/2016 at Unknown time  . morphine (MS CONTIN) 15 MG 12 hr tablet Take 15 mg by mouth every 12 (twelve) hours.   10/31/2016 at Unknown time  . Multiple Vitamin (MULTIVITAMIN WITH MINERALS) TABS tablet Take 1 tablet by mouth daily.   10/31/2016 at Unknown time  .  Oxycodone HCl 10 MG TABS Take 1 tablet (10 mg total) by mouth every 6 (six) hours as needed. 15 tablet 0 10/31/2016 at Unknown time  . promethazine (PHENERGAN) 12.5 MG tablet Take 1 tablet (12.5 mg total) by mouth every 6 (six) hours as needed for nausea or vomiting. 30 tablet 0 10/31/2016 at Unknown time  . Vitamin D, Ergocalciferol, (DRISDOL) 50000 units CAPS capsule Take 1 capsule (50,000 Units total) by mouth every 7 (seven) days. Takes on Sundays. 4 capsule 11 Past Week at Unknown time  . potassium chloride SA (K-DUR,KLOR-CON) 20 MEQ tablet Take 2 tablets (40 mEq total) by mouth daily. (Patient not taking: Reported on 10/28/2016) 30 tablet 0 Unknown at Unknown time    Allergies: Desferal [deferoxamine]; Latex; Lisinopril; Tape; and Tylenol [acetaminophen] Past Medical History:  Diagnosis Date  . Blood transfusion   . Demand ischemia ([place]) 02/06/2016  . HCAP (healthcare-associated pneumonia) 05/19/2013  . 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 Pulmonary hypertension 02/06/2016   49 mmHg per echo on 02/06/16   . 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 Sickle cell anemia (HCC)   . Sickle cell disease ([place])   . Sickle cell disease, type S ([place])   . Vitamin B12 deficiency 07/07/2015   Past Surgical History:  Procedure Laterality Date  .  left knee ACL reconstruction    . CESAREAN SECTION     x 2  . CHOLECYSTECTOMY    . IR GENERIC HISTORICAL  07/17/2016   IR FLUORO  GUIDE PORT INSERTION RIGHT 07/17/2016 Aletta Edouard, MD [facility]  . IR GENERIC HISTORICAL  07/17/2016   IR US GUIDE VASC ACCESS RIGHT 07/17/2016 Aletta Edouard, MD [facility]  . IR GENERIC HISTORICAL  10/12/2016   IR PTA VENOUS EXCEPT DIALYSIS CIRCUIT 10/12/2016 Arne Cleveland, MD [facility]  . IR GENERIC HISTORICAL  10/12/2016   IR US GUIDE VASC ACCESS RIGHT 10/12/2016 Arne Cleveland, MD [facility]  . IR GENERIC HISTORICAL  10/12/2016   IR VENO/EXT/UNI RIGHT 10/12/2016 Arne Cleveland, MD [facility]  .  PORT-A-CATH REMOVAL Left 07/29/2013   Procedure: REMOVAL PORT-A-CATH;  Surgeon: Scherry Ran, MD;  Location: [facility];  Service: General;  Laterality: Left;  . PORTACATH PLACEMENT    . PORTACATH PLACEMENT Left 02/23/2013   Procedure: INSERTION PORT-A-CATH;  Surgeon: Donato Heinz, MD;  Location: [facility];  Service: General;  Laterality: Left;  . TEE WITHOUT CARDIOVERSION N/A 08/07/2013   Procedure: TRANSESOPHAGEAL ECHOCARDIOGRAM (TEE);  Surgeon: Arnoldo Lenis, MD;  Location: [facility];  Service: Cardiology;  Laterality: N/A;   Family History  Problem Relation Age of Onset  . Sickle cell trait Mother   . Sickle cell trait Father   . Hypertension Father   . Diabetes Father   . Sickle cell trait Sister   . Cancer Paternal Aunt     Breast   Social History   Social History  . Marital status: Divorced    Spouse name: N/A  . Number of children: N/A  . Years of education: N/A   Occupational History  . Not on file.   Social History Main Topics  . Smoking status: Never Smoker  . Smokeless tobacco: Never Used  . Alcohol use No  . Drug use: No  . Sexual activity: Yes    Birth control/ protection: IUD   Other Topics Concern  . Not on file   Social History Narrative  . No narrative on file    Review of Systems: Constitutional: negative Eyes: positive for icterus Ears, nose, mouth, throat, and face: negative Respiratory: negative for cough, dyspnea on exertion and wheezing Cardiovascular: negative for chest pain and dyspnea Gastrointestinal: negative for diarrhea and nausea Genitourinary:negative for dysuria Integument/breast: negative Hematologic/lymphatic: negative Musculoskeletal:positive for back pain and myalgias Neurological: negative Behavioral/Psych: negative Endocrine: negative Allergic/Immunologic: negative  Physical Exam: Blood pressure (!) 157/90, pulse 92, temperature 98.5 F (36.9 C), resp. rate 18, height 5\' 3"  (1.6 m), weight 161 lb (73 kg),  SpO2 97 %. BP (!) 148/85 (BP Location: Left Arm)   Pulse 92   Temp 98.4 F (36.9 C) (Oral)   Resp 20   Ht 5\' 3"  (1.6 m)   Wt 161 lb (73 kg)   SpO2 98%   BMI 28.52 kg/m   General Appearance:    Alert, cooperative, mild distress, appears stated age  Head:    Normocephalic, without obvious abnormality, atraumatic  Eyes:    PERRL, conjunctiva/corneas clear, EOM's intact, fundi    benign, Icterus  Ears:    Normal TM's and external ear canals, both ears  Nose:   Nares normal, septum midline, mucosa normal, no drainage    or sinus tenderness  Throat:   Lips, mucosa, and tongue normal; teeth and gums normal  Neck:   Supple, symmetrical, trachea midline, no adenopathy;    thyroid:  no enlargement/tenderness/nodules; no carotid   bruit or JVD  Back:     Symmetric, no curvature, ROM normal, no CVA tenderness  Lungs:  Clear to auscultation bilaterally, respirations unlabored  Chest Wall:    No tenderness or deformity   Heart:    Regular rate and rhythm, S1 and S2 normal, no murmur, rub   or gallop  Abdomen:     Soft, non-tender, bowel sounds active all four quadrants,    no masses, no organomegaly  Extremities:   Extremities normal, atraumatic, no cyanosis or edema  Pulses:   2+ and symmetric all extremities  Skin:   Skin color, texture, turgor normal, no rashes or lesions  Lymph nodes:   Cervical, supraclavicular, and axillary nodes normal  Neurologic:   CNII-XII intact, normal strength, sensation and reflexes    throughout    Lab results: No results found for this or any previous visit (from the past 24 hour(s)).  Imaging results:  No results found.   Assessment & Plan:  Patient will be admitted to the day infusion center for extended observation  Start IV D5.45 for cellular rehydration at 125/hr  Start Toradol 30 mg IV every 6 hours for inflammation.  Start Dilaudid PCA High Concentration per weight based protocol.   Patient will be re-evaluated for pain intensity in  the context of function and relationship to baseline as care progresses.  If no significant pain relief, will transfer patient to inpatient services for a higher level of care.   Reviewed labs from 10/30/2016, will repeat H&H and BMP   Keara Pagliarulo M 10/31/2016, 10:36 AM

## 2016-11-01 ENCOUNTER — Other Ambulatory Visit: Payer: Self-pay | Admitting: Internal Medicine

## 2016-11-01 MED ORDER — PROMETHAZINE HCL 12.5 MG PO TABS
12.5000 mg | ORAL_TABLET | Freq: Four times a day (QID) | ORAL | 0 refills | Status: DC | PRN
Start: 2016-11-01 — End: 2016-12-13

## 2016-11-01 MED ORDER — MORPHINE SULFATE ER 15 MG PO TBCR: 15.0000 mg | EXTENDED_RELEASE_TABLET | Freq: Two times a day (BID) | ORAL | 0 refills | Status: DC

## 2016-11-01 MED ORDER — OXYCODONE HCL 10 MG PO TABS: 10.0000 mg | ORAL_TABLET | ORAL | 0 refills | Status: DC | PRN

## 2016-11-01 NOTE — Discharge Summary (Signed)
[facility] Discharge Summary   Patient ID: Dawn [name] MRN: [id number] DOB/AGE: 1981/01/06 36 y.o.  Admit date: 10/31/2016 Discharge date: 11/01/2016  Primary Care Physician:  Angelica Chessman, MD  Admission Diagnoses:  Active Problems:   Sickle cell crisis [facility])   Discharge Medications:  Allergies as of 10/31/2016      Reactions   Desferal [deferoxamine] Hives   Local reaction on arm only during infusion. Can take with benadryl    Latex Other (See Comments)   REACTION: Pt experiences a burning sensation on contacted skin areas   Lisinopril Other (See Comments), Cough   REACTION: Sore/scratchy throat   Tape Other (See Comments)   REACTION: Pt. Experiences a burning sensation on contacted skin areas   Tylenol [acetaminophen] Other (See Comments)   Pt. Does not take the following due to protection of kidney health. Past occurrence of Protein in urine.      Medication List    ASK your doctor about these medications   aspirin 81 MG EC tablet Take 1 tablet (81 mg total) by mouth daily.   folic acid 1 MG tablet Commonly known as:  FOLVITE Take 1 tablet (1 mg total) by mouth daily.   hydroxyurea 500 MG capsule Commonly known as:  HYDREA Take 2 capsules (1,000 mg total) by mouth daily. May take with food to minimize GI side effects.   levonorgestrel 20 MCG/24HR IUD Commonly known as:  MIRENA 1 each by Intrauterine route once.   loratadine 10 MG tablet Commonly known as:  CLARITIN Take 10 mg by mouth daily.   magnesium oxide 400 (241.3 Mg) MG tablet Commonly known as:  MAG-OX Take 1 tablet (400 mg total) by mouth daily.   morphine 15 MG 12 hr tablet Commonly known as:  MS CONTIN Take 15 mg by mouth every 12 (twelve) hours.   multivitamin with minerals Tabs tablet Take 1 tablet by mouth daily.   Oxycodone HCl 10 MG Tabs Take 1 tablet (10 mg total) by mouth every 6 (six) hours as needed.   potassium chloride SA 20 MEQ tablet Commonly known as:   K-DUR,KLOR-CON Take 2 tablets (40 mEq total) by mouth daily.   promethazine 12.5 MG tablet Commonly known as:  PHENERGAN Take 1 tablet (12.5 mg total) by mouth every 6 (six) hours as needed for nausea or vomiting.   Vitamin D (Ergocalciferol) 50000 units Caps capsule Commonly known as:  DRISDOL Take 1 capsule (50,000 Units total) by mouth every 7 (seven) days. Takes on Sundays.        Consults:  None  Significant Diagnostic Studies:  Dg Chest 2 View  Result Date: 10/02/2016 CLINICAL DATA:  Sickle-cell crisis.  Short of breath. EXAM: CHEST  2 VIEW COMPARISON:  09/30/2016 FINDINGS: Mild cardiac enlargement. Lungs are clear without infiltrate effusion or edema. Port-A-Cath tip in the lower SVC unchanged. No change from the prior study. IMPRESSION: No active cardiopulmonary disease. Electronically Signed   By: Franchot Gallo M.D.   On: 10/02/2016 19:09   Ct Head Wo Contrast  Result Date: 10/15/2016 CLINICAL DATA:  Recent syncopal episode EXAM: CT HEAD WITHOUT CONTRAST TECHNIQUE: Contiguous axial images were obtained from the base of the skull through the vertex without intravenous contrast. COMPARISON:  None. FINDINGS: Brain: No evidence of acute infarction, hemorrhage, hydrocephalus, extra-axial collection or mass lesion/mass effect. Vascular: No hyperdense vessel or unexpected calcification. Skull: Normal. Negative for fracture or focal lesion. Sinuses/Orbits: No acute finding. Other: None. IMPRESSION: No acute intracranial abnormality noted.  Electronically Signed   By: Inez Catalina M.D.   On: 10/15/2016 18:49   Ct Angio Chest Pe W Or Wo Contrast  Result Date: 10/03/2016 CLINICAL DATA:  Sickle cell anemia, worsening lower extremity and chest pain EXAM: CT ANGIOGRAPHY CHEST WITH CONTRAST TECHNIQUE: Multidetector CT imaging of the chest was performed using the standard protocol during bolus administration of intravenous contrast. Multiplanar CT image reconstructions and MIPs were obtained to  evaluate the vascular anatomy. CONTRAST:  100 cc Isovue COMPARISON:  08/25/2015 FINDINGS: Cardiovascular: Borderline cardiomegaly. No pericardial effusion. There is right IJ Port-A-Cath with tip in SVC right atrium junction. No pulmonary embolus No aortic aneurysm of aortic dissection. Mediastinum/Nodes: No mediastinal hematoma or adenopathy. Central airway is patent. Lungs/Pleura: Mild central venous congestion. Images of the lung parenchyma shows no infiltrate or pulmonary edema. No focal consolidation. Mild dependent atelectasis noted bilateral lower lobe posteriorly. There is no pneumothorax. No bronchiectasis. No emphysema. Upper Abdomen: The visualized upper abdomen shows status post cholecystectomy. No adrenal gland mass. There is nodular contour of the liver consistent with cirrhosis. Lobulated contour bilateral kidney. Musculoskeletal: Stable sclerotic bony changes within thoracic spine and sternum consistent with history of sickle cell anemia. No bone destruction or acute fracture. Stable sclerotic changes bilateral proximal humerus. Review of the MIP images confirms the above findings. IMPRESSION: 1. No pulmonary embolus is noted. 2. No mediastinal hematoma or adenopathy. 3. Borderline cardiomegaly.  No pericardial effusion. 4. Central mild venous congestion without pulmonary edema. No segmental consolidation. Mild dependent atelectasis bilateral lower lobe posteriorly. No pneumothorax. 5. Stable chronic sclerotic bony changes. 6. Again noted nodular contour of the liver consistent with cirrhosis. Status post cholecystectomy. Electronically Signed   By: Lahoma Crocker M.D.   On: 10/03/2016 08:40   Mr [name] Head Wo Contrast  Result Date: 10/16/2016 CLINICAL DATA:  Sickle cell crisis, syncopal episode and unresponsive. EXAM: MRA HEAD WITHOUT CONTRAST TECHNIQUE: Angiographic images of the Circle of Willis were obtained using MRA technique without intravenous contrast. COMPARISON:  CT HEAD October 15, 2016  FINDINGS: Moderately motion degraded examination. ANTERIOR CIRCULATION: Normal flow related enhancement of the included cervical, petrous, cavernous and supraclinoid internal carotid arteries. Patent anterior communicating artery. Duplicated appearance of the anterior middle cerebral arteries due to patient motion, vessels are patent. No severe stenosis. Limited assessment for aneurysm and vasculopathy due to motion. POSTERIOR CIRCULATION: Codominant vertebral arteries. Basilar artery is patent, with normal flow related enhancement of the main branch vessels. Duplicated appearance of the posterior cerebral arteries due to patient motion, vessels are patent. No severe stenosis. Limited assessment for aneurysm and vasculopathy due to motion. ANATOMIC VARIANTS: None. IMPRESSION: No emergent large vessel occlusion or severe stenosis on this moderately motion degraded examination. Electronically Signed   By: Elon Alas M.D.   On: 10/16/2016 18:44   Ir Veno/ext/uni Right  Result Date: 10/12/2016 CLINICAL DATA:  Sickle cell disease. Right IJ port catheter placement 07/17/2016 due to poor peripheral venous access and recurrent venous access needs. Right arm swelling. Negative Doppler. EXAM: RIGHT EXTREMITY VENOGRAPHY; IR PTA VENOUS EXCEPT DIALYSIS CIRCUIT; IR ULTRASOUND GUIDANCE VASC ACCESS RIGHT PROCEDURE: The procedure, risks (including but not limited to bleeding, infection, organ damage ), benefits, and alternatives were explained to the patient. Questions regarding the procedure were encouraged and answered. The patient understands and consents to the procedure. Right upper arm prepped with chlorhexidine, draped in usual sterile fashion. Under real-time ultrasound guidance, the right brachial vein was accessed with a micropuncture 21 gauge Set for right  upper extremity venography. Ultrasound documentation was saved. Right venous access and surrounding brachial region prepped and draped in usual sterile  fashion. Maximal barrier sterile technique was utilized including caps, mask, sterile gowns, sterile gloves, sterile drape, hand hygiene and skin antiseptic. The right venous access was exchanged over a Bentson wire for a 7 [place] vascular sheath, through which an angled 5 [place] angiographic catheter was advanced and negotiated across the right brachiocephalic venous occlusion using an angled Glidewire. 3000 units heparin given IV. The catheter was exchanged over an Amplatz wire for a a 12 mm x 4 cm atlas angioplasty balloon, used to dilate the is obstruction. After final venogram, the catheter was removed and hemostasis achieved at the site. The patient tolerated the procedure well. ANESTHESIA/SEDATION: Intravenous Fentanyl and Versed were administered as conscious sedation during continuous cardiorespiratory monitoring by the radiology RN, with a total moderate sedation time of 16 minutes. MEDICATIONS: Lidocaine 1% subcutaneous CONTRAST:  1 ISOVUE-300 IOPAMIDOL (ISOVUE-300) INJECTION 61%, 1 ISOVUE-300 IOPAMIDOL (ISOVUE-300) INJECTION XX123456 COMPLICATIONS: None immediate FINDINGS: Right upper extremity venography demonstrates patency of right axillary and subclavian veins. There is a high-grade stenosis in the right brachiocephalic vein with right upper extremity drainage via thyrocervical and body wall collaterals. The collateral flow ultimately opacifies the left innominate vein, azygos arch, and SVC, shown to be patent to the right atrium. The right brachiocephalic vein stenosis responded well to the 12 mm balloon angioplasty. Follow-up venogram shows no significant residual or recurrent stenosis. No extravasation, dissection, or other apparent complication. No further filling of collateral channels. IMPRESSION: 1. High-grade right brachiocephalic vein stenosis, which likely accounted for the patient's right upper extremity swelling. 2. Good response of the lesion to 12 mm balloon angioplasty. Consider continued  low level anticoagulation to prevent recurrence. Electronically Signed   By: Lucrezia Europe M.D.   On: 10/12/2016 11:18   Ir US Guide Vasc Access Right  Result Date: 10/12/2016 CLINICAL DATA:  Sickle cell disease. Right IJ port catheter placement 07/17/2016 due to poor peripheral venous access and recurrent venous access needs. Right arm swelling. Negative Doppler. EXAM: RIGHT EXTREMITY VENOGRAPHY; IR PTA VENOUS EXCEPT DIALYSIS CIRCUIT; IR ULTRASOUND GUIDANCE VASC ACCESS RIGHT PROCEDURE: The procedure, risks (including but not limited to bleeding, infection, organ damage ), benefits, and alternatives were explained to the patient. Questions regarding the procedure were encouraged and answered. The patient understands and consents to the procedure. Right upper arm prepped with chlorhexidine, draped in usual sterile fashion. Under real-time ultrasound guidance, the right brachial vein was accessed with a micropuncture 21 gauge Set for right upper extremity venography. Ultrasound documentation was saved. Right venous access and surrounding brachial region prepped and draped in usual sterile fashion. Maximal barrier sterile technique was utilized including caps, mask, sterile gowns, sterile gloves, sterile drape, hand hygiene and skin antiseptic. The right venous access was exchanged over a Bentson wire for a 7 [place] vascular sheath, through which an angled 5 [place] angiographic catheter was advanced and negotiated across the right brachiocephalic venous occlusion using an angled Glidewire. 3000 units heparin given IV. The catheter was exchanged over an Amplatz wire for a a 12 mm x 4 cm atlas angioplasty balloon, used to dilate the is obstruction. After final venogram, the catheter was removed and hemostasis achieved at the site. The patient tolerated the procedure well. ANESTHESIA/SEDATION: Intravenous Fentanyl and Versed were administered as conscious sedation during continuous cardiorespiratory monitoring by the  radiology RN, with a total moderate sedation time of 16 minutes. MEDICATIONS: Lidocaine 1%  subcutaneous CONTRAST:  1 ISOVUE-300 IOPAMIDOL (ISOVUE-300) INJECTION 61%, 1 ISOVUE-300 IOPAMIDOL (ISOVUE-300) INJECTION XX123456 COMPLICATIONS: None immediate FINDINGS: Right upper extremity venography demonstrates patency of right axillary and subclavian veins. There is a high-grade stenosis in the right brachiocephalic vein with right upper extremity drainage via thyrocervical and body wall collaterals. The collateral flow ultimately opacifies the left innominate vein, azygos arch, and SVC, shown to be patent to the right atrium. The right brachiocephalic vein stenosis responded well to the 12 mm balloon angioplasty. Follow-up venogram shows no significant residual or recurrent stenosis. No extravasation, dissection, or other apparent complication. No further filling of collateral channels. IMPRESSION: 1. High-grade right brachiocephalic vein stenosis, which likely accounted for the patient's right upper extremity swelling. 2. Good response of the lesion to 12 mm balloon angioplasty. Consider continued low level anticoagulation to prevent recurrence. Electronically Signed   By: Lucrezia Europe M.D.   On: 10/12/2016 11:18   Ir Pta Venous Except Dialysis Circuit  Result Date: 10/12/2016 CLINICAL DATA:  Sickle cell disease. Right IJ port catheter placement 07/17/2016 due to poor peripheral venous access and recurrent venous access needs. Right arm swelling. Negative Doppler. EXAM: RIGHT EXTREMITY VENOGRAPHY; IR PTA VENOUS EXCEPT DIALYSIS CIRCUIT; IR ULTRASOUND GUIDANCE VASC ACCESS RIGHT PROCEDURE: The procedure, risks (including but not limited to bleeding, infection, organ damage ), benefits, and alternatives were explained to the patient. Questions regarding the procedure were encouraged and answered. The patient understands and consents to the procedure. Right upper arm prepped with chlorhexidine, draped in usual sterile fashion.  Under real-time ultrasound guidance, the right brachial vein was accessed with a micropuncture 21 gauge Set for right upper extremity venography. Ultrasound documentation was saved. Right venous access and surrounding brachial region prepped and draped in usual sterile fashion. Maximal barrier sterile technique was utilized including caps, mask, sterile gowns, sterile gloves, sterile drape, hand hygiene and skin antiseptic. The right venous access was exchanged over a Bentson wire for a 7 [place] vascular sheath, through which an angled 5 [place] angiographic catheter was advanced and negotiated across the right brachiocephalic venous occlusion using an angled Glidewire. 3000 units heparin given IV. The catheter was exchanged over an Amplatz wire for a a 12 mm x 4 cm atlas angioplasty balloon, used to dilate the is obstruction. After final venogram, the catheter was removed and hemostasis achieved at the site. The patient tolerated the procedure well. ANESTHESIA/SEDATION: Intravenous Fentanyl and Versed were administered as conscious sedation during continuous cardiorespiratory monitoring by the radiology RN, with a total moderate sedation time of 16 minutes. MEDICATIONS: Lidocaine 1% subcutaneous CONTRAST:  1 ISOVUE-300 IOPAMIDOL (ISOVUE-300) INJECTION 61%, 1 ISOVUE-300 IOPAMIDOL (ISOVUE-300) INJECTION XX123456 COMPLICATIONS: None immediate FINDINGS: Right upper extremity venography demonstrates patency of right axillary and subclavian veins. There is a high-grade stenosis in the right brachiocephalic vein with right upper extremity drainage via thyrocervical and body wall collaterals. The collateral flow ultimately opacifies the left innominate vein, azygos arch, and SVC, shown to be patent to the right atrium. The right brachiocephalic vein stenosis responded well to the 12 mm balloon angioplasty. Follow-up venogram shows no significant residual or recurrent stenosis. No extravasation, dissection, or other apparent  complication. No further filling of collateral channels. IMPRESSION: 1. High-grade right brachiocephalic vein stenosis, which likely accounted for the patient's right upper extremity swelling. 2. Good response of the lesion to 12 mm balloon angioplasty. Consider continued low level anticoagulation to prevent recurrence. Electronically Signed   By: Lucrezia Europe M.D.   On: 10/12/2016 11:18  [facility] Course: Ms. Lloyd Bakey was admitted to the day infusion center for extended observation. She was started on high concentration dilaudid PCA. She used a total of 11 mg with 15 demands and 15 deliveries. Pain intensity decreased to baseline. Patient alert, oriented and ambulating.  Patient advised to follow up in clinic as previously scheduled for medication management.  The patient was given clear instructions to go to ER or return to medical center if symptoms do not improve, worsen or new problems develop. The patient verbalized understanding. Physical Exam at Discharge:  BP 128/73 (BP Location: Right Arm)   Pulse 86   Temp 98.2 F (36.8 C) (Oral)   Resp 16   Ht 5\' 3"  (1.6 m)   Wt 161 lb (73 kg)   SpO2 96%   BMI 28.52 kg/m  BP 128/73 (BP Location: Right Arm)   Pulse 86   Temp 98.2 F (36.8 C) (Oral)   Resp 16   Ht 5\' 3"  (1.6 m)   Wt 161 lb (73 kg)   SpO2 96%   BMI 28.52 kg/m   General Appearance:    Alert, cooperative, no distress, appears stated age  Head:    Normocephalic, without obvious abnormality, atraumatic  Back:     Symmetric, no curvature, ROM normal, no CVA tenderness  Lungs:     Clear to auscultation bilaterally, respirations unlabored  Chest Wall:    No tenderness or deformity   Heart:    Regular rate and rhythm, S1 and S2 normal, no murmur, rub   or gallop  Abdomen:     Soft, non-tender, bowel sounds active all four quadrants,    no masses, no organomegaly  Lymph nodes:   Cervical, supraclavicular, and axillary nodes normal  Neurologic:   CNII-XII  intact, normal strength, sensation and reflexes    throughout   Disposition at Discharge: 01-Home or Self Care  Discharge Orders:   Condition at Discharge:   Stable  Time spent on Discharge:  15 minutes  Signed: Heran Campau M 11/01/2016, 8:40 AM

## 2016-11-02 ENCOUNTER — Encounter (HOSPITAL_COMMUNITY): Payer: Self-pay

## 2016-11-02 ENCOUNTER — Emergency Department (HOSPITAL_COMMUNITY)
Admission: EM | Admit: 2016-11-02 | Discharge: 2016-11-02 | Disposition: A | Discharge status: Home / Self Care | Payer: Medicare Other | Attending: Emergency Medicine | Admitting: Emergency Medicine

## 2016-11-02 DIAGNOSIS — D5700 Hb-SS disease with crisis, unspecified: Secondary | ICD-10-CM | POA: Diagnosis not present

## 2016-11-02 DIAGNOSIS — Z7982 Long term (current) use of aspirin: Secondary | ICD-10-CM | POA: Insufficient documentation

## 2016-11-02 DIAGNOSIS — M545 Low back pain: Secondary | ICD-10-CM | POA: Diagnosis present

## 2016-11-02 DIAGNOSIS — Z79899 Other long term (current) drug therapy: Secondary | ICD-10-CM | POA: Insufficient documentation

## 2016-11-02 DIAGNOSIS — D57219 Sickle-cell/Hb-C disease with crisis, unspecified: Secondary | ICD-10-CM | POA: Diagnosis not present

## 2016-11-02 LAB — COMPREHENSIVE METABOLIC PANEL
ALBUMIN: 2.6 g/dL — AB (ref 3.5–5.0)
ALK PHOS: 124 U/L (ref 38–126)
ALT: 47 U/L (ref 14–54)
AST: 129 U/L — AB (ref 15–41)
Anion gap: 5 (ref 5–15)
BILIRUBIN TOTAL: 13.7 mg/dL — AB (ref 0.3–1.2)
CALCIUM: 8.4 mg/dL — AB (ref 8.9–10.3)
CO2: 25 mmol/L (ref 22–32)
Chloride: 108 mmol/L (ref 101–111)
Creatinine, Ser: 0.54 mg/dL (ref 0.44–1.00)
GFR calc Af Amer: >60 mL/min (ref >60)
GFR calc non Af Amer: >60 mL/min (ref >60)
GLUCOSE: 123 mg/dL — AB (ref 65–99)
Potassium: 3.7 mmol/L (ref 3.5–5.1)
SODIUM: 138 mmol/L (ref 135–145)
TOTAL PROTEIN: 7.7 g/dL (ref 6.5–8.1)

## 2016-11-02 LAB — CBC WITH DIFFERENTIAL/PLATELET
BASOS ABS: 0.1 10*3/uL (ref 0.0–0.1)
Basophils Relative: 1 %
Eosinophils Absolute: 0.1 10*3/uL (ref 0.0–0.7)
Eosinophils Relative: 2 %
HEMATOCRIT: 22.9 % — AB (ref 36.0–46.0)
HEMOGLOBIN: 8.4 g/dL — AB (ref 12.0–15.0)
LYMPHS PCT: 29 %
Lymphs Abs: 1.7 10*3/uL (ref 0.7–4.0)
MCH: 36.5 pg — ABNORMAL HIGH (ref 26.0–34.0)
MCHC: 36.8 g/dL — ABNORMAL HIGH (ref 30.0–36.0)
MCV: 99.1 fL (ref 78.0–100.0)
MONOS PCT: 15 %
Monocytes Absolute: 0.9 10*3/uL (ref 0.1–1.0)
NEUTROS ABS: 2.9 10*3/uL (ref 1.7–7.7)
NEUTROS PCT: 53 %
Platelets: 169 10*3/uL (ref 150–400)
RBC: 2.31 MIL/uL — AB (ref 3.87–5.11)
RDW: 20.1 % — ABNORMAL HIGH (ref 11.5–15.5)
WBC: 5.7 10*3/uL (ref 4.0–10.5)

## 2016-11-02 LAB — RETICULOCYTES
RBC.: 2.31 MIL/uL — ABNORMAL LOW (ref 3.87–5.11)
RETIC COUNT ABSOLUTE: 448.1 10*3/uL — AB (ref 19.0–186.0)
Retic Ct Pct: 19.4 % — ABNORMAL HIGH (ref 0.4–3.1)

## 2016-11-02 LAB — URINALYSIS, ROUTINE W REFLEX MICROSCOPIC
Bilirubin Urine: NEGATIVE
GLUCOSE, UA: NEGATIVE mg/dL
KETONES UR: NEGATIVE mg/dL
Leukocytes, UA: NEGATIVE
Nitrite: NEGATIVE
PH: 7 (ref 5.0–8.0)
PROTEIN: 30 mg/dL — AB
Specific Gravity, Urine: 1.005 (ref 1.005–1.030)

## 2016-11-02 LAB — PREGNANCY, URINE: Preg Test, Ur: NEGATIVE

## 2016-11-02 MED ORDER — HYDROMORPHONE HCL 2 MG/ML IJ SOLN
2.0000 mg | INTRAMUSCULAR | Status: AC
  Administered 2016-11-02: 2 mg via INTRAVENOUS
  Filled 2016-11-02: qty 1

## 2016-11-02 MED ORDER — PROMETHAZINE HCL 12.5 MG PO TABS
25.0000 mg | ORAL_TABLET | ORAL | Status: DC | PRN
  Administered 2016-11-02: 25 mg via ORAL
  Filled 2016-11-02: qty 2

## 2016-11-02 MED ORDER — DIPHENHYDRAMINE HCL 25 MG PO CAPS
25.0000 mg | ORAL_CAPSULE | ORAL | Status: DC | PRN
  Administered 2016-11-02: 50 mg via ORAL
  Filled 2016-11-02: qty 2

## 2016-11-02 MED ORDER — HYDROMORPHONE HCL 2 MG/ML IJ SOLN: 2.0000 mg | INTRAMUSCULAR | Status: DC

## 2016-11-02 MED ORDER — DEXTROSE-NACL 5-0.45 % IV SOLN
INTRAVENOUS | Status: DC
  Administered 2016-11-02: 09:00:00 via INTRAVENOUS

## 2016-11-02 MED ORDER — HYDROMORPHONE HCL 2 MG/ML IJ SOLN: 2.0000 mg | INTRAMUSCULAR | Status: AC

## 2016-11-02 MED ORDER — HYDROMORPHONE HCL 2 MG/ML IJ SOLN
2.0000 mg | INTRAMUSCULAR | Status: DC
  Administered 2016-11-02: 2 mg via INTRAVENOUS
  Filled 2016-11-02: qty 1

## 2016-11-02 MED ORDER — KETOROLAC TROMETHAMINE 30 MG/ML IJ SOLN
30.0000 mg | INTRAMUSCULAR | Status: AC
  Administered 2016-11-02: 30 mg via INTRAVENOUS
  Filled 2016-11-02: qty 1

## 2016-11-02 MED ORDER — HYDROMORPHONE HCL 2 MG/ML IJ SOLN
2.0000 mg | Freq: Once | INTRAMUSCULAR | Status: AC
  Administered 2016-11-02: 2 mg via INTRAVENOUS
  Filled 2016-11-02: qty 1

## 2016-11-02 NOTE — ED Provider Notes (Signed)
[facility] DEPT Provider Note   CSN: [id number] Arrival date & time: 11/02/16  0814     History   Chief Complaint Chief Complaint  Patient presents with  . Sickle Cell Pain Crisis    HPI Dawn [name] is a 36 y.o. female.  HPI  Pt was seen at [place]. Per pt, c/o gradual onset and persistence of constant acute flair of her chronic sickle cell "pain" for the past 2 weeks, worse since overnight last night. Pt describes the pain as located in her low back and extremities, and consistent with her chronic pain pattern. Has been taking her usual pain meds without improvement. Denies CP/palpitations, no SOB/cough, no abd pain, no N/V/D, no fevers, no focal motor weakness.   Past Medical History:  Diagnosis Date  . Blood transfusion   . Demand ischemia ([place]) 02/06/2016  . HCAP (healthcare-associated pneumonia) 05/19/2013  . 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 Pulmonary hypertension 02/06/2016   49 mmHg per echo on 02/06/16   . 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 Sickle cell anemia (HCC)   . Sickle cell disease ([place])   . 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 Vitamin B12 deficiency 07/07/2015    Patient Active Problem List   Diagnosis Date Noted  . Syncope 10/17/2016  . Dizziness   . Hypomagnesemia   . Swelling of right upper extremity   . 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 Sickle cell crisis ([place]) 10/03/2016  . Positive D dimer   . Pulmonary hypertension 02/16/2016  . 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 Anemia of chronic disease   . Chronic pain syndrome 10/14/2015  . 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 Vitamin B12 deficiency 07/07/2015  . Thrombocytopenia ([place]) 07/06/2015  . Hyperbilirubinemia 09/11/2014  . Macrocytosis 06/13/2014  . Vitamin D deficiency 11/23/2013  . 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 Hypokalemia 03/28/2012  . 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 Elevated LFTs  04/27/2011  . Sickle cell disease ([place]) 04/26/2011    Past Surgical History:  Procedure Laterality Date  .  left knee ACL reconstruction    . CESAREAN SECTION     x 2  . CHOLECYSTECTOMY    . IR GENERIC HISTORICAL  07/17/2016   IR FLUORO GUIDE PORT INSERTION RIGHT 07/17/2016 Aletta Edouard, MD [facility]  . IR GENERIC HISTORICAL  07/17/2016   IR US GUIDE VASC ACCESS RIGHT 07/17/2016 Aletta Edouard, MD [facility]  . IR GENERIC HISTORICAL  10/12/2016   IR PTA VENOUS EXCEPT DIALYSIS CIRCUIT 10/12/2016 Arne Cleveland, MD [facility]  . IR GENERIC HISTORICAL  10/12/2016   IR US GUIDE VASC ACCESS RIGHT 10/12/2016 Arne Cleveland, MD [facility]  . IR GENERIC HISTORICAL  10/12/2016   IR VENO/EXT/UNI RIGHT 10/12/2016 Arne Cleveland, MD [facility]  . PORT-A-CATH REMOVAL Left 07/29/2013   Procedure: REMOVAL PORT-A-CATH;  Surgeon: Scherry Ran, MD;  Location: [facility];  Service: General;  Laterality: Left;  . PORTACATH PLACEMENT    . PORTACATH PLACEMENT Left 02/23/2013   Procedure: INSERTION PORT-A-CATH;  Surgeon: Donato Heinz, MD;  Location: [facility];  Service: General;  Laterality: Left;  . TEE WITHOUT CARDIOVERSION N/A 08/07/2013   Procedure: TRANSESOPHAGEAL ECHOCARDIOGRAM (TEE);  Surgeon: Arnoldo Lenis, MD;  Location: [facility];  Service: Cardiology;  Laterality: N/A;    OB History    Gravida Para Term Preterm AB Living   1 1   1    1  SAB TAB Ectopic Multiple Live Births                   Home Medications    Prior to Admission medications   Medication Sig Start Date End Date Taking? Authorizing Provider  aspirin EC 81 MG EC tablet Take 1 tablet (81 mg total) by mouth daily. 10/18/16   Leana Gamer, MD  folic acid (FOLVITE) 1 MG tablet Take 1 tablet (1 mg total) by mouth daily. 03/14/16   [name], FNP  hydroxyurea (HYDREA) 500 MG capsule Take 2 capsules (1,000 mg total) by mouth daily. May take with food to minimize GI side effects. 03/14/16   [name], FNP  levonorgestrel (MIRENA) 20 MCG/24HR IUD 1 each by Intrauterine route once.    Historical Provider, MD  loratadine (CLARITIN) 10 MG tablet Take 10 mg by mouth daily.    Historical Provider, MD  magnesium oxide (MAG-OX) 400 (241.3 Mg) MG tablet Take 1 tablet (400 mg total) by mouth daily. 10/15/16   Leana Gamer, MD  morphine (MS CONTIN) 15 MG 12 hr tablet Take 1 tablet (15 mg total) by mouth every 12 (twelve) hours. 11/01/16   Tresa Garter, MD  Multiple Vitamin (MULTIVITAMIN WITH MINERALS) TABS tablet Take 1 tablet by mouth daily.    Historical Provider, MD  Oxycodone HCl 10 MG TABS Take 1 tablet (10 mg total) by mouth every 4 (four) hours as needed. 11/01/16   Tresa Garter, MD  potassium chloride SA (K-DUR,KLOR-CON) 20 MEQ tablet Take 2 tablets (40 mEq total) by mouth daily. Patient not taking: Reported on 10/28/2016 10/15/16   Leana Gamer, MD  promethazine (PHENERGAN) 12.5 MG tablet Take 1 tablet (12.5 mg total) by mouth every 6 (six) hours as needed for nausea or vomiting. 11/01/16   Tresa Garter, MD  Vitamin D, Ergocalciferol, (DRISDOL) 50000 units CAPS capsule Take 1 capsule (50,000 Units total) by mouth every 7 (seven) days. Takes on Sundays. 06/01/16   Micheline Chapman, NP    Family History Family History  Problem Relation Age of Onset  . Sickle cell trait Mother   . Sickle cell trait Father   . Hypertension Father   . Diabetes Father   . Sickle cell trait Sister   . Cancer Paternal Aunt     Breast    Social History Social History  Substance Use Topics  . Smoking status: Never Smoker  . Smokeless tobacco: Never Used  . Alcohol use No     Allergies   Desferal [deferoxamine]; Latex; Lisinopril; Tape; and Tylenol [acetaminophen]   Review of Systems Review of Systems ROS: Statement: All systems negative except as marked or noted in the HPI; Constitutional: Negative for fever and chills. ; ; Eyes: Negative for eye pain, redness and  discharge. ; ; ENMT: Negative for ear pain, hoarseness, nasal congestion, sinus pressure and sore throat. ; ; Cardiovascular: Negative for chest pain, palpitations, diaphoresis, dyspnea and peripheral edema. ; ; Respiratory: Negative for cough, wheezing and stridor. ; ; Gastrointestinal: Negative for nausea, vomiting, diarrhea, abdominal pain, blood in stool, hematemesis, jaundice and rectal bleeding. . ; ; Genitourinary: Negative for dysuria, flank pain and hematuria. ; ; Musculoskeletal: +LBP, extremities pain. Negative for neck pain. Negative for swelling and trauma.; ; Skin: Negative for pruritus, rash, abrasions, blisters, bruising and skin lesion.; ; Neuro: Negative for headache, lightheadedness and neck stiffness. Negative for weakness, altered level of consciousness, altered mental status, extremity weakness, paresthesias,  involuntary movement, seizure and syncope.       Physical Exam Updated Vital Signs BP 143/82 (BP Location: Left Arm)   Pulse 84   Temp 98.7 F (37.1 C) (Oral)   Resp 20   Ht 5\' 3"  (1.6 m)   Wt 163 lb (73.9 kg)   SpO2 96%   BMI 28.87 kg/m   Physical Exam 0830: Physical examination:  Nursing notes reviewed; Vital signs and O2 SAT reviewed;  Constitutional: Well developed, Well nourished, Well hydrated, In no acute distress; Head:  Normocephalic, atraumatic; Eyes: EOMI, PERRL, No scleral icterus; ENMT: Mouth and pharynx normal, Mucous membranes moist; Neck: Supple, Full range of motion, No lymphadenopathy; Cardiovascular: Regular rate and rhythm, No gallop; Respiratory: Breath sounds clear & equal bilaterally, No wheezes.  Speaking full sentences with ease, Normal respiratory effort/excursion; Chest: Nontender, Movement normal; Abdomen: Soft, Nontender, Nondistended, Normal bowel sounds; Genitourinary: No CVA tenderness; Extremities: Pulses normal, No tenderness, No edema, No calf edema or asymmetry.; Neuro: AA&Ox3, Major CN grossly intact.  Speech clear. No gross focal  motor or sensory deficits in extremities.; Skin: Color normal, Warm, Dry.   ED Treatments / Results  Labs (all labs ordered are listed, but only abnormal results are displayed)   EKG  EKG Interpretation None       Radiology   Procedures Procedures (including critical care time)  Medications Ordered in ED Medications  HYDROmorphone (DILAUDID) injection 2 mg (not administered)    Or  HYDROmorphone (DILAUDID) injection 2 mg (not administered)  HYDROmorphone (DILAUDID) injection 2 mg (not administered)    Or  HYDROmorphone (DILAUDID) injection 2 mg (not administered)  HYDROmorphone (DILAUDID) injection 2 mg (not administered)    Or  HYDROmorphone (DILAUDID) injection 2 mg (not administered)  HYDROmorphone (DILAUDID) injection 2 mg (not administered)    Or  HYDROmorphone (DILAUDID) injection 2 mg (not administered)  diphenhydrAMINE (BENADRYL) capsule 25-50 mg (not administered)  promethazine (PHENERGAN) tablet 25 mg (not administered)  dextrose 5 %-0.45 % sodium chloride infusion (not administered)  ketorolac (TORADOL) 30 MG/ML injection 30 mg (not administered)     Initial Impression / Assessment and Plan / ED Course  I have reviewed the triage vital signs and the nursing notes.  Pertinent labs & imaging results that were available during my care of the patient were reviewed by me and considered in my medical decision making (see chart for details).  MDM Reviewed: nursing note, previous chart and vitals Reviewed previous: labs Interpretation: labs     Results for orders placed or performed during the hospital encounter of 11/02/16  Reticulocytes  Result Value Ref Range   Retic Ct Pct 19.4 (H) 0.4 - 3.1 %   RBC. 2.31 (L) 3.87 - 5.11 MIL/uL   Retic Count, Manual 448.1 (H) 19.0 - 186.0 K/uL  CBC WITH DIFFERENTIAL  Result Value Ref Range   WBC 5.7 4.0 - 10.5 K/uL   RBC 2.31 (L) 3.87 - 5.11 MIL/uL   Hemoglobin 8.4 (L) 12.0 - 15.0 g/dL   HCT 22.9 (L) 36.0 -  46.0 %   MCV 99.1 78.0 - 100.0 fL   MCH 36.5 (H) 26.0 - 34.0 pg   MCHC 36.8 (H) 30.0 - 36.0 g/dL   RDW 20.1 (H) 11.5 - 15.5 %   Platelets 169 150 - 400 K/uL   Neutrophils Relative % 53 %   Lymphocytes Relative 29 %   Monocytes Relative 15 %   Eosinophils Relative 2 %   Basophils Relative 1 %   Neutro  Abs 2.9 1.7 - 7.7 K/uL   Lymphs Abs 1.7 0.7 - 4.0 K/uL   Monocytes Absolute 0.9 0.1 - 1.0 K/uL   Eosinophils Absolute 0.1 0.0 - 0.7 K/uL   Basophils Absolute 0.1 0.0 - 0.1 K/uL   RBC Morphology SICKLE CELLS   Comprehensive metabolic panel  Result Value Ref Range   Sodium 138 135 - 145 mmol/L   Potassium 3.7 3.5 - 5.1 mmol/L   Chloride 108 101 - 111 mmol/L   CO2 25 22 - 32 mmol/L   Glucose, Bld 123 (H) 65 - 99 mg/dL   BUN <5 (L) 6 - 20 mg/dL   Creatinine, Ser 0.54 0.44 - 1.00 mg/dL   Calcium 8.4 (L) 8.9 - 10.3 mg/dL   Total Protein 7.7 6.5 - 8.1 g/dL   Albumin 2.6 (L) 3.5 - 5.0 g/dL   AST 129 (H) 15 - 41 U/L   ALT 47 14 - 54 U/L   Alkaline Phosphatase 124 38 - 126 U/L   Total Bilirubin 13.7 (H) 0.3 - 1.2 mg/dL   GFR calc non Af Amer >60 >60 mL/min   GFR calc Af Amer >60 >60 mL/min   Anion gap 5 5 - 15  Pregnancy, urine  Result Value Ref Range   Preg Test, Ur NEGATIVE NEGATIVE  Urinalysis, Routine w reflex microscopic  Result Value Ref Range   Color, Urine AMBER (A) YELLOW   APPearance CLEAR CLEAR   Specific Gravity, Urine 1.005 1.005 - 1.030   pH 7.0 5.0 - 8.0   Glucose, UA NEGATIVE NEGATIVE mg/dL   Hgb urine dipstick MODERATE (A) NEGATIVE   Bilirubin Urine NEGATIVE NEGATIVE   Ketones, ur NEGATIVE NEGATIVE mg/dL   Protein, ur 30 (A) NEGATIVE mg/dL   Nitrite NEGATIVE NEGATIVE   Leukocytes, UA NEGATIVE NEGATIVE   RBC / HPF 0-5 0 - 5 RBC/hpf   WBC, UA 0-5 0 - 5 WBC/hpf   Bacteria, UA RARE (A) NONE SEEN    1430:  Sickle cell protocol initiated on pt arrival. Pt has received 4 doses of IV dilaudid and now states she wants to go home. Labs per baseline. Dx and testing  d/w pt.  Questions answered.  Verb understanding, agreeable to d/c home with outpt f/u.      Final Clinical Impressions(s) / ED Diagnoses   Final diagnoses:  Sickle cell pain crisis [facility])    New Prescriptions New Prescriptions   No medications on file     Francine Graven, DO 11/06/16 [id number]

## 2016-11-02 NOTE — Discharge Instructions (Signed)
Take your usual prescriptions as previously directed.  Call your regular medical doctor today to schedule a follow up appointment within the next 2 days.  Return to the Emergency Department immediately sooner if worsening.  ° °

## 2016-11-02 NOTE — ED Notes (Signed)
Pt refusing cardiac monitoring. 

## 2016-11-02 NOTE — ED Triage Notes (Addendum)
Pt reports sickle cell pain for 2 weeks, woke up this morning with increase pain. Pain meds not helping. Pt reports admitted to [facility] this week for sickle cell pain

## 2016-11-04 ENCOUNTER — Encounter (HOSPITAL_COMMUNITY): Payer: Self-pay | Admitting: Emergency Medicine

## 2016-11-04 ENCOUNTER — Emergency Department (HOSPITAL_COMMUNITY)
Admission: EM | Admit: 2016-11-04 | Discharge: 2016-11-05 | Disposition: A | Discharge status: Home / Self Care | Payer: Medicare Other | Attending: Emergency Medicine | Admitting: Emergency Medicine

## 2016-11-04 DIAGNOSIS — Z7982 Long term (current) use of aspirin: Secondary | ICD-10-CM | POA: Insufficient documentation

## 2016-11-04 DIAGNOSIS — D5700 Hb-SS disease with crisis, unspecified: Secondary | ICD-10-CM | POA: Insufficient documentation

## 2016-11-04 DIAGNOSIS — Z79899 Other long term (current) drug therapy: Secondary | ICD-10-CM | POA: Insufficient documentation

## 2016-11-04 DIAGNOSIS — M79641 Pain in right hand: Secondary | ICD-10-CM | POA: Diagnosis present

## 2016-11-04 DIAGNOSIS — D57219 Sickle-cell/Hb-C disease with crisis, unspecified: Secondary | ICD-10-CM | POA: Diagnosis not present

## 2016-11-04 NOTE — ED Triage Notes (Signed)
Pt states she started having sickle cell pain yesterday evening. Pain meds not working.

## 2016-11-05 LAB — CBC WITH DIFFERENTIAL/PLATELET
BLASTS: 0 %
Band Neutrophils: 0 %
Basophils Absolute: 0 10*3/uL (ref 0.0–0.1)
Basophils Relative: 0 %
EOS PCT: 0 %
Eosinophils Absolute: 0 10*3/uL (ref 0.0–0.7)
HEMATOCRIT: 20.1 % — AB (ref 36.0–46.0)
HEMOGLOBIN: 7.5 g/dL — AB (ref 12.0–15.0)
LYMPHS ABS: 2.7 10*3/uL (ref 0.7–4.0)
Lymphocytes Relative: 29 %
MCH: 36.8 pg — AB (ref 26.0–34.0)
MCHC: 37.3 g/dL — AB (ref 30.0–36.0)
MCV: 98.5 fL (ref 78.0–100.0)
MYELOCYTES: 0 %
Metamyelocytes Relative: 0 %
Monocytes Absolute: 1.1 10*3/uL — ABNORMAL HIGH (ref 0.1–1.0)
Monocytes Relative: 12 %
NEUTROS PCT: 59 %
NRBC: 0 /100{WBCs}
Neutro Abs: 5.4 10*3/uL (ref 1.7–7.7)
Other: 0 %
Platelets: 125 10*3/uL — ABNORMAL LOW (ref 150–400)
Promyelocytes Absolute: 0 %
RBC: 2.04 MIL/uL — ABNORMAL LOW (ref 3.87–5.11)
RDW: 20.4 % — ABNORMAL HIGH (ref 11.5–15.5)
WBC: 9.2 10*3/uL (ref 4.0–10.5)

## 2016-11-05 LAB — URINALYSIS, ROUTINE W REFLEX MICROSCOPIC
BACTERIA UA: NONE SEEN
Glucose, UA: NEGATIVE mg/dL
KETONES UR: NEGATIVE mg/dL
Leukocytes, UA: NEGATIVE
Nitrite: NEGATIVE
PH: 6 (ref 5.0–8.0)
Protein, ur: 100 mg/dL — AB
SPECIFIC GRAVITY, URINE: 1.011 (ref 1.005–1.030)

## 2016-11-05 LAB — COMPREHENSIVE METABOLIC PANEL
ALK PHOS: 128 U/L — AB (ref 38–126)
ALT: 39 U/L (ref 14–54)
ANION GAP: 7 (ref 5–15)
AST: 113 U/L — ABNORMAL HIGH (ref 15–41)
Albumin: 2.5 g/dL — ABNORMAL LOW (ref 3.5–5.0)
BILIRUBIN TOTAL: 14.3 mg/dL — AB (ref 0.3–1.2)
BUN: 7 mg/dL (ref 6–20)
CALCIUM: 8.4 mg/dL — AB (ref 8.9–10.3)
CO2: 23 mmol/L (ref 22–32)
Chloride: 106 mmol/L (ref 101–111)
Creatinine, Ser: 0.49 mg/dL (ref 0.44–1.00)
GFR calc Af Amer: >60 mL/min (ref >60)
Glucose, Bld: 92 mg/dL (ref 65–99)
Potassium: 3.4 mmol/L — ABNORMAL LOW (ref 3.5–5.1)
Sodium: 136 mmol/L (ref 135–145)
TOTAL PROTEIN: 7.1 g/dL (ref 6.5–8.1)

## 2016-11-05 LAB — RETICULOCYTES
RBC.: 2.04 MIL/uL — AB (ref 3.87–5.11)
RETIC COUNT ABSOLUTE: 418.2 10*3/uL — AB (ref 19.0–186.0)
Retic Ct Pct: 20.5 % — ABNORMAL HIGH (ref 0.4–3.1)

## 2016-11-05 LAB — I-STAT BETA HCG BLOOD, ED (MC, WL, AP ONLY): I-stat hCG, quantitative: <5 m[IU]/mL (ref <5)

## 2016-11-05 MED ORDER — HYDROMORPHONE HCL 2 MG/ML IJ SOLN
2.0000 mg | Freq: Once | INTRAMUSCULAR | Status: AC
Start: 2016-11-05 — End: 2016-11-05
  Administered 2016-11-05: 2 mg via INTRAVENOUS
  Filled 2016-11-05: qty 1

## 2016-11-05 MED ORDER — SODIUM CHLORIDE 0.9 % IV BOLUS (SEPSIS)
1000.0000 mL | Freq: Once | INTRAVENOUS | Status: AC
  Administered 2016-11-05: 1000 mL via INTRAVENOUS

## 2016-11-05 MED ORDER — PROMETHAZINE HCL 25 MG/ML IJ SOLN
25.0000 mg | Freq: Once | INTRAMUSCULAR | Status: AC
  Administered 2016-11-05: 25 mg via INTRAVENOUS
  Filled 2016-11-05: qty 1

## 2016-11-05 MED ORDER — HYDROMORPHONE HCL 2 MG/ML IJ SOLN
2.0000 mg | Freq: Once | INTRAMUSCULAR | Status: AC
  Administered 2016-11-05: 2 mg via INTRAVENOUS
  Filled 2016-11-05: qty 1

## 2016-11-05 MED ORDER — KETOROLAC TROMETHAMINE 30 MG/ML IJ SOLN
30.0000 mg | Freq: Once | INTRAMUSCULAR | Status: AC
  Administered 2016-11-05: 30 mg via INTRAVENOUS
  Filled 2016-11-05: qty 1

## 2016-11-05 MED ORDER — HEPARIN SOD (PORK) LOCK FLUSH 100 UNIT/ML IV SOLN
INTRAVENOUS | Status: AC
  Administered 2016-11-05: 500 [IU]
  Filled 2016-11-05: qty 5

## 2016-11-05 MED ORDER — DIPHENHYDRAMINE HCL 25 MG PO CAPS
50.0000 mg | ORAL_CAPSULE | Freq: Once | ORAL | Status: AC
  Administered 2016-11-05: 50 mg via ORAL
  Filled 2016-11-05: qty 2

## 2016-11-05 NOTE — ED Notes (Signed)
Pt unable to provide urine sample due to previous urination before being roomed. Pt states she will let us know when able.

## 2016-11-05 NOTE — ED Notes (Signed)
Pt states understanding of care given and follow up instructions.  Pt ambulated from ED to waiting area.  To be transported home by family/friend

## 2016-11-05 NOTE — ED Provider Notes (Signed)
By signing my name below, I, Eunice Blase, attest that this documentation has been prepared under the direction and in the presence of [place], DO. Electronically signed, Eunice Blase, ED Scribe. 11/05/16. 12:53 AM.  TIME SEEN: 0039  CHIEF COMPLAINT: Bilateral extremity pain  HPI:  Dawn [name] is a 36 y.o. female with Hx of sickle cell anemia who presents to the Emergency Department complaining of persistent, constant, severe bilateral upper and lower extremity pain x 2-3 days. She notes that this is typical of her sickle cell pain. She takes MS Contin and oxycodone at home and states it has not been relieving her pain. Pt states she had a port placed in 06/2016.  She also notes she last transfused 08/2016 at [facility]. Reports her hemoglobin is normally between 8-9. 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 Not on anticoagulation currently. No history of MI, CVA. Pt denies fever, chills, vomiting, diarrhea, chest pain, shortness of breath and cough.  ROS: See HPI Constitutional: no fever  Eyes: no drainage  ENT: no runny nose   Cardiovascular:  no chest pain  Resp: no SOB  GI: no vomiting GU: no dysuria Integumentary: no rash  Allergy: no hives  Musculoskeletal: no leg swelling  Neurological: no slurred speech ROS otherwise negative  PAST MEDICAL HISTORY/PAST SURGICAL HISTORY:  Past Medical History:  Diagnosis Date  . Blood transfusion   . Demand ischemia ([place]) 02/06/2016  . HCAP (healthcare-associated pneumonia) 05/19/2013  . 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 Pulmonary hypertension 02/06/2016   49 mmHg per echo on 02/06/16   . 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 Sickle cell anemia (HCC)   . Sickle cell disease ([place])   . 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 Vitamin B12 deficiency 07/07/2015    MEDICATIONS:  Prior to Admission medications   Medication Sig Start Date End Date Taking? Authorizing Provider  aspirin EC 81 MG EC tablet Take 1 tablet  (81 mg total) by mouth daily. 10/18/16   Leana Gamer, MD  folic acid (FOLVITE) 1 MG tablet Take 1 tablet (1 mg total) by mouth daily. 03/14/16   [name], FNP  hydroxyurea (HYDREA) 500 MG capsule Take 2 capsules (1,000 mg total) by mouth daily. May take with food to minimize GI side effects. 03/14/16   [name], FNP  levonorgestrel (MIRENA) 20 MCG/24HR IUD 1 each by Intrauterine route once.    Historical Provider, MD  loratadine (CLARITIN) 10 MG tablet Take 10 mg by mouth daily.    Historical Provider, MD  morphine (MS CONTIN) 15 MG 12 hr tablet Take 1 tablet (15 mg total) by mouth every 12 (twelve) hours. 11/01/16   Tresa Garter, MD  Multiple Vitamin (MULTIVITAMIN WITH MINERALS) TABS tablet Take 1 tablet by mouth daily.    Historical Provider, MD  Oxycodone HCl 10 MG TABS Take 1 tablet (10 mg total) by mouth every 4 (four) hours as needed. 11/01/16   Tresa Garter, MD  promethazine (PHENERGAN) 12.5 MG tablet Take 1 tablet (12.5 mg total) by mouth every 6 (six) hours as needed for nausea or vomiting. 11/01/16   Tresa Garter, MD  Vitamin D, Ergocalciferol, (DRISDOL) 50000 units CAPS capsule Take 1 capsule (50,000 Units total) by mouth every 7 (seven) days. Takes on Sundays. 06/01/16   Micheline Chapman, NP    ALLERGIES:  Allergies  Allergen Reactions  . Desferal [Deferoxamine] Hives    Local reaction  on arm only during infusion. Can take with benadryl   . Latex Other (See Comments)    REACTION: Pt experiences a burning sensation on contacted skin areas  . Lisinopril Other (See Comments) and Cough    REACTION: Sore/scratchy throat  . Tape Other (See Comments)    REACTION: Pt. Experiences a burning sensation on contacted skin areas  . Tylenol [Acetaminophen] Other (See Comments)    Pt. Does not take the following due to protection of kidney health. Past occurrence of Protein in urine.    SOCIAL HISTORY:  Social History  Substance Use Topics  . Smoking  status: Never Smoker  . Smokeless tobacco: Never Used  . Alcohol use No    FAMILY HISTORY: Family History  Problem Relation Age of Onset  . Sickle cell trait Mother   . Sickle cell trait Father   . Hypertension Father   . Diabetes Father   . Sickle cell trait Sister   . Cancer Paternal Aunt     Breast    EXAM: BP 126/82 (BP Location: Left Arm)   Pulse 89   Temp 98.2 F (36.8 C) (Oral)   Resp 18   Ht 5\' 3"  (1.6 m)   Wt 163 lb (73.9 kg)   SpO2 97%   BMI 28.87 kg/m  CONSTITUTIONAL: Alert and oriented and responds appropriately to questions. Uncomfortable appearing, tearful, Afebrile and nontoxic HEAD: Normocephalic EYES: Conjunctivae clear, PERRL, EOMI, bilateral scleral icterus ENT: normal nose; no rhinorrhea; moist mucous membranes NECK: Supple, no meningismus, no nuchal rigidity, no LAD  CARD: RRR; S1 and S2 appreciated; no murmurs, no clicks, no rubs, no gallops RESP: Normal chest excursion without splinting or tachypnea; breath sounds clear and equal bilaterally; no wheezes, no rhonchi, no rales, no hypoxia or respiratory distress, speaking full sentences ABD/GI: Normal bowel sounds; non-distended; soft, non-tender, no rebound, no guarding, no peritoneal signs, no hepatosplenomegaly BACK:  The back appears normal and is non-tender to palpation, there is no CVA tenderness EXT: Normal ROM in all joints; non-tender to palpation; no edema; normal capillary refill; no cyanosis, no calf tenderness or swelling, no swelling, erythema or warmth of her extremities. Compartments are all soft. 2+ radial and DP pulses bilaterally.    SKIN: Normal color for age and race; warm; no rash NEURO: Moves all extremities equally, sensation to light touch intact diffusely, cranial nerves II through XII intact, normal speech PSYCH: The patient's mood and manner are appropriate. Grooming and personal hygiene are appropriate.  MEDICAL DECISION MAKING: Patient here with her typical sickle cell  crisis. Denies infectious symptoms. No chest pain or shortness of breath. No neurologic deficits. Extremities are warm and well-perfused. We'll obtain labs, urine. Will give IV fluids, oxygen, Dilaudid, Phenergan, Toradol as she reports this is what normally helps her symptoms.  ED PROGRESS: Patient's labs show anemia, hemoglobin is 7.5. Discussed with her that this is not low enough that I would transfuse her especially given she is not actively bleeding and is hemodynamically stable. She agrees with this and states "I have a lot of antibodies in my blood". She does appear to be any sickle cell crisis with elevated liver function tests, reticulocyte count. She is not pregnant. Urine shows no sign of infection. She has proteinuria and hematuria which is chronic for her. Patient improving after IV Dilaudid. Discussed with her that we would give her 3 rounds of pain medication and decide if we need to admit her versus discharge home.  5:15 AM  Pt reports  pain is 4/10 and would like discharge home after 3 rounds of IV Dilaudid 2 mg each. She is still hemodynamically stable. States that someone will come to pick her up and drive her home. She has pain medication at home and a PCP for follow-up. Have offered admission but she declined at this time stating see Audelia Acton she can manage her pain at home. Discussed return precautions with patient. She is comfortable with this plan.   At this time, I do not feel there is any life-threatening condition present. I have reviewed and discussed all results (EKG, imaging, lab, urine as appropriate) and exam findings with patient/family. I have reviewed nursing notes and appropriate previous records.  I feel the patient is safe to be discharged home without further emergent workup and can continue workup as an outpatient as needed. Discussed usual and customary return precautions. Patient/family verbalize understanding and are comfortable with this plan.  Outpatient follow-up has  been provided. All questions have been answered.   I personally performed the services described in this documentation, which was scribed in my presence. The recorded information has been reviewed and is accurate.    [place], DO 11/05/16 [phone]

## 2016-11-06 ENCOUNTER — Non-Acute Institutional Stay (HOSPITAL_COMMUNITY)
Admission: AD | Admit: 2016-11-06 | Discharge: 2016-11-06 | Disposition: A | Discharge status: Home / Self Care | Payer: Medicare Other | Source: Ambulatory Visit | Attending: Internal Medicine | Admitting: Internal Medicine

## 2016-11-06 ENCOUNTER — Encounter (HOSPITAL_COMMUNITY): Payer: Self-pay | Admitting: *Deleted

## 2016-11-06 ENCOUNTER — Telehealth (HOSPITAL_COMMUNITY): Payer: Self-pay | Admitting: *Deleted

## 2016-11-06 DIAGNOSIS — Z7982 Long term (current) use of aspirin: Secondary | ICD-10-CM | POA: Diagnosis not present

## 2016-11-06 DIAGNOSIS — Z79899 Other long term (current) drug therapy: Secondary | ICD-10-CM | POA: Diagnosis not present

## 2016-11-06 DIAGNOSIS — D5700 Hb-SS disease with crisis, unspecified: Secondary | ICD-10-CM | POA: Diagnosis not present

## 2016-11-06 DIAGNOSIS — M549 Dorsalgia, unspecified: Secondary | ICD-10-CM | POA: Diagnosis present

## 2016-11-06 MED ORDER — HYDROMORPHONE 1 MG/ML IV SOLN
INTRAVENOUS | Status: DC
Start: 2016-11-06 — End: 2016-11-06
  Administered 2016-11-06: 11.9 mg via INTRAVENOUS
  Administered 2016-11-06: 10:00:00 via INTRAVENOUS
  Filled 2016-11-06: qty 25

## 2016-11-06 MED ORDER — SODIUM CHLORIDE 0.9% FLUSH
10.0000 mL | INTRAVENOUS | Status: AC | PRN
  Administered 2016-11-06: 10 mL

## 2016-11-06 MED ORDER — HEPARIN SOD (PORK) LOCK FLUSH 100 UNIT/ML IV SOLN
500.0000 [IU] | INTRAVENOUS | Status: AC | PRN
  Administered 2016-11-06: 500 [IU]
  Filled 2016-11-06: qty 5

## 2016-11-06 MED ORDER — PROMETHAZINE HCL 25 MG PO TABS
25.0000 mg | ORAL_TABLET | Freq: Once | ORAL | Status: AC
  Administered 2016-11-06: 25 mg via ORAL

## 2016-11-06 MED ORDER — ONDANSETRON HCL 4 MG/2ML IJ SOLN
4.0000 mg | Freq: Four times a day (QID) | INTRAMUSCULAR | Status: DC | PRN
  Administered 2016-11-06: 4 mg via INTRAVENOUS
  Filled 2016-11-06: qty 2

## 2016-11-06 MED ORDER — HYDROMORPHONE HCL 2 MG/ML IJ SOLN: 2.0000 mg | INTRAMUSCULAR | Status: DC | PRN

## 2016-11-06 MED ORDER — DEXTROSE-NACL 5-0.45 % IV SOLN
INTRAVENOUS | Status: DC
  Administered 2016-11-06: 10:00:00 via INTRAVENOUS

## 2016-11-06 MED ORDER — NALOXONE HCL 0.4 MG/ML IJ SOLN: 0.4000 mg | INTRAMUSCULAR | Status: DC | PRN

## 2016-11-06 MED ORDER — PROMETHAZINE HCL 25 MG PO TABS
12.5000 mg | ORAL_TABLET | Freq: Once | ORAL | Status: DC
  Filled 2016-11-06: qty 1

## 2016-11-06 MED ORDER — SENNOSIDES-DOCUSATE SODIUM 8.6-50 MG PO TABS: 1.0000 | ORAL_TABLET | Freq: Two times a day (BID) | ORAL | Status: DC

## 2016-11-06 MED ORDER — POLYETHYLENE GLYCOL 3350 17 G PO PACK: 17.0000 g | PACK | Freq: Every day | ORAL | Status: DC | PRN

## 2016-11-06 MED ORDER — DIPHENHYDRAMINE HCL 25 MG PO CAPS
25.0000 mg | ORAL_CAPSULE | ORAL | Status: DC | PRN
  Administered 2016-11-06: 25 mg via ORAL
  Filled 2016-11-06: qty 1

## 2016-11-06 MED ORDER — SODIUM CHLORIDE 0.9% FLUSH: 9.0000 mL | INTRAVENOUS | Status: DC | PRN

## 2016-11-06 MED ORDER — SODIUM CHLORIDE 0.9 % IV SOLN
25.0000 mg | INTRAVENOUS | Status: DC | PRN
  Filled 2016-11-06: qty 0.5

## 2016-11-06 MED ORDER — KETOROLAC TROMETHAMINE 30 MG/ML IJ SOLN
30.0000 mg | Freq: Four times a day (QID) | INTRAMUSCULAR | Status: DC
  Administered 2016-11-06: 30 mg via INTRAVENOUS
  Filled 2016-11-06: qty 1

## 2016-11-06 NOTE — Telephone Encounter (Signed)
Patient called requesting treatment at the [facility]. Patient c/o of arm and leg pain and rates it 9/10 on pain scale. Patient denies fever, chest pain, N/V/D. Patient reports left sided abdominal pain. Per patient, she has taken her home medications MS Cotin and Oxycodone at 7am this morning with no relief. Patient placed on a brief hold and provider was notified.   Patient advised to come to the [facility] for treatment per Dr. Doreene Burke. Patient states an understanding and is on the way.

## 2016-11-06 NOTE — Discharge Summary (Signed)
Physician Discharge Summary  Dawn [name] [id number] DOB: Jan 08, 1981 DOA: 11/06/2016  PCP: Angelica Chessman, MD  Admit date: 11/06/2016  Discharge date: 11/06/2016  Time spent: 30 minutes  Discharge Diagnoses:  Active Problems:   Sickle cell anemia with pain [facility])  Discharge Condition: Stable  Diet recommendation: Regular  Filed Weights   11/06/16 0921  Weight: 163 lb (73.9 kg)   History of present illness:  Dawn [name] is a 36 y.o. female with history of sickle cell disease, HbSS presents to the day hospital today with complaints of pain to lower back and extremities similar to her sickle cell related pain. Current pain intensity is 10/10 described as constant and throbbing. She has been very frequent at the ED and day hospital for the same complain. She was last seen at [facility] ED yesterday where she was worked up and managed for sickle cell pain crisis. She has been taking all other medications consistently, she has taken her home medications MS Cotin and Oxycodone at 7am this morning with no relief. She denies chest pain, shortness of breath, paresthesias, headache, dysuria, nausea, vomiting or diarrhea.   Hospital Course:  Dawn [name] was admitted to the day hospital with sickle cell painful crisis. Patient was treated with weight based IV Dilaudid PCA, IV Toradol as well as IV fluids. Dawn [name] showed significant improvement symptomatically, pain improved from 10 to 5/10 at the time of discharge. Patient was discharged home in a hemodynamically stable condition. Dawn [name] will follow-up at the clinic as previously scheduled, continue with home medications as per prior to admission.  Discharge Instructions We discussed the need for good hydration, monitoring of hydration status, avoidance of heat, cold, stress, and infection triggers. We discussed the need to be compliant with taking Hydrea. Dawn [name] was reminded of the need to seek medical attention of any symptoms of bleeding, anemia, or  infection occurs.  Discharge Exam: Vitals:   11/06/16 1210 11/06/16 1407  BP: 125/71 (!) 141/76  Pulse: 86 87  Resp: 15 14  Temp: 98.3 F (36.8 C)     General appearance: alert, cooperative and no distress Eyes: conjunctivae/corneas clear. PERRL, EOM's intact. Fundi benign. Neck: no adenopathy, no carotid bruit, no JVD, supple, symmetrical, trachea midline and thyroid not enlarged, symmetric, no tenderness/mass/nodules Back: symmetric, no curvature. ROM normal. No CVA tenderness. Resp: clear to auscultation bilaterally Chest wall: no tenderness Cardio: regular rate and rhythm, S1, S2 normal, no murmur, click, rub or gallop GI: soft, non-tender; bowel sounds normal; no masses, no organomegaly Extremities: extremities normal, atraumatic, no cyanosis or edema Pulses: 2+ and symmetric Skin: Skin color, texture, turgor normal. No rashes or lesions Neurologic: Grossly normal  Discharge Instructions    Diet - low sodium heart healthy    Complete by:  As directed    Increase activity slowly    Complete by:  As directed      Discharge Medication List as of 11/06/2016  4:10 PM    CONTINUE these medications which have NOT CHANGED   Details  aspirin EC 81 MG EC tablet Take 1 tablet (81 mg total) by mouth daily., Starting Thu 0000000, Normal    folic acid (FOLVITE) 1 MG tablet Take 1 tablet (1 mg total) by mouth daily., Starting 03/14/2016, Until Discontinued, Normal    hydroxyurea (HYDREA) 500 MG capsule Take 2 capsules (1,000 mg total) by mouth daily. May take with food to minimize GI side effects., Starting 03/14/2016, Until Discontinued, Normal    loratadine (CLARITIN) 10 MG  tablet Take 10 mg by mouth daily., Until Discontinued, Historical Med    morphine (MS CONTIN) 15 MG 12 hr tablet Take 1 tablet (15 mg total) by mouth every 12 (twelve) hours., Starting Thu 11/01/2016, Print    Multiple Vitamin (MULTIVITAMIN WITH MINERALS) TABS tablet Take 1 tablet by mouth daily., Until  Discontinued, Historical Med    Oxycodone HCl 10 MG TABS Take 1 tablet (10 mg total) by mouth every 4 (four) hours as needed., Starting Thu 11/01/2016, Print    promethazine (PHENERGAN) 12.5 MG tablet Take 1 tablet (12.5 mg total) by mouth every 6 (six) hours as needed for nausea or vomiting., Starting Thu 11/01/2016, Print    Vitamin D, Ergocalciferol, (DRISDOL) 50000 units CAPS capsule Take 1 capsule (50,000 Units total) by mouth every 7 (seven) days. Takes on Sundays., Starting Fri 06/01/2016, Normal    levonorgestrel (MIRENA) 20 MCG/24HR IUD 1 each by Intrauterine route once., Historical Med       Allergies  Allergen Reactions  . Desferal [Deferoxamine] Hives    Local reaction on arm only during infusion. Can take with benadryl   . Latex Other (See Comments)    REACTION: Pt experiences a burning sensation on contacted skin areas  . Lisinopril Other (See Comments) and Cough    REACTION: Sore/scratchy throat  . Tape Other (See Comments)    REACTION: Pt. Experiences a burning sensation on contacted skin areas  . Tylenol [Acetaminophen] Other (See Comments)    Pt. Does not take the following due to protection of kidney health. Past occurrence of Protein in urine.     Significant Diagnostic Studies: Ct Head Wo Contrast  Result Date: 10/15/2016 CLINICAL DATA:  Recent syncopal episode EXAM: CT HEAD WITHOUT CONTRAST TECHNIQUE: Contiguous axial images were obtained from the base of the skull through the vertex without intravenous contrast. COMPARISON:  None. FINDINGS: Brain: No evidence of acute infarction, hemorrhage, hydrocephalus, extra-axial collection or mass lesion/mass effect. Vascular: No hyperdense vessel or unexpected calcification. Skull: Normal. Negative for fracture or focal lesion. Sinuses/Orbits: No acute finding. Other: None. IMPRESSION: No acute intracranial abnormality noted. Electronically Signed   By: Inez Catalina M.D.   On: 10/15/2016 18:49   Mr [name] Head Wo  Contrast  Result Date: 10/16/2016 CLINICAL DATA:  Sickle cell crisis, syncopal episode and unresponsive. EXAM: MRA HEAD WITHOUT CONTRAST TECHNIQUE: Angiographic images of the Circle of Willis were obtained using MRA technique without intravenous contrast. COMPARISON:  CT HEAD October 15, 2016 FINDINGS: Moderately motion degraded examination. ANTERIOR CIRCULATION: Normal flow related enhancement of the included cervical, petrous, cavernous and supraclinoid internal carotid arteries. Patent anterior communicating artery. Duplicated appearance of the anterior middle cerebral arteries due to patient motion, vessels are patent. No severe stenosis. Limited assessment for aneurysm and vasculopathy due to motion. POSTERIOR CIRCULATION: Codominant vertebral arteries. Basilar artery is patent, with normal flow related enhancement of the main branch vessels. Duplicated appearance of the posterior cerebral arteries due to patient motion, vessels are patent. No severe stenosis. Limited assessment for aneurysm and vasculopathy due to motion. ANATOMIC VARIANTS: None. IMPRESSION: No emergent large vessel occlusion or severe stenosis on this moderately motion degraded examination. Electronically Signed   By: Elon Alas M.D.   On: 10/16/2016 18:44   Ir Veno/ext/uni Right  Result Date: 10/12/2016 CLINICAL DATA:  Sickle cell disease. Right IJ port catheter placement 07/17/2016 due to poor peripheral venous access and recurrent venous access needs. Right arm swelling. Negative Doppler. EXAM: RIGHT EXTREMITY VENOGRAPHY; IR PTA VENOUS EXCEPT DIALYSIS  CIRCUIT; IR ULTRASOUND GUIDANCE VASC ACCESS RIGHT PROCEDURE: The procedure, risks (including but not limited to bleeding, infection, organ damage ), benefits, and alternatives were explained to the patient. Questions regarding the procedure were encouraged and answered. The patient understands and consents to the procedure. Right upper arm prepped with chlorhexidine, draped in  usual sterile fashion. Under real-time ultrasound guidance, the right brachial vein was accessed with a micropuncture 21 gauge Set for right upper extremity venography. Ultrasound documentation was saved. Right venous access and surrounding brachial region prepped and draped in usual sterile fashion. Maximal barrier sterile technique was utilized including caps, mask, sterile gowns, sterile gloves, sterile drape, hand hygiene and skin antiseptic. The right venous access was exchanged over a Bentson wire for a 7 [place] vascular sheath, through which an angled 5 [place] angiographic catheter was advanced and negotiated across the right brachiocephalic venous occlusion using an angled Glidewire. 3000 units heparin given IV. The catheter was exchanged over an Amplatz wire for a a 12 mm x 4 cm atlas angioplasty balloon, used to dilate the is obstruction. After final venogram, the catheter was removed and hemostasis achieved at the site. The patient tolerated the procedure well. ANESTHESIA/SEDATION: Intravenous Fentanyl and Versed were administered as conscious sedation during continuous cardiorespiratory monitoring by the radiology RN, with a total moderate sedation time of 16 minutes. MEDICATIONS: Lidocaine 1% subcutaneous CONTRAST:  1 ISOVUE-300 IOPAMIDOL (ISOVUE-300) INJECTION 61%, 1 ISOVUE-300 IOPAMIDOL (ISOVUE-300) INJECTION XX123456 COMPLICATIONS: None immediate FINDINGS: Right upper extremity venography demonstrates patency of right axillary and subclavian veins. There is a high-grade stenosis in the right brachiocephalic vein with right upper extremity drainage via thyrocervical and body wall collaterals. The collateral flow ultimately opacifies the left innominate vein, azygos arch, and SVC, shown to be patent to the right atrium. The right brachiocephalic vein stenosis responded well to the 12 mm balloon angioplasty. Follow-up venogram shows no significant residual or recurrent stenosis. No extravasation, dissection,  or other apparent complication. No further filling of collateral channels. IMPRESSION: 1. High-grade right brachiocephalic vein stenosis, which likely accounted for the patient's right upper extremity swelling. 2. Good response of the lesion to 12 mm balloon angioplasty. Consider continued low level anticoagulation to prevent recurrence. Electronically Signed   By: Lucrezia Europe M.D.   On: 10/12/2016 11:18   Ir US Guide Vasc Access Right  Result Date: 10/12/2016 CLINICAL DATA:  Sickle cell disease. Right IJ port catheter placement 07/17/2016 due to poor peripheral venous access and recurrent venous access needs. Right arm swelling. Negative Doppler. EXAM: RIGHT EXTREMITY VENOGRAPHY; IR PTA VENOUS EXCEPT DIALYSIS CIRCUIT; IR ULTRASOUND GUIDANCE VASC ACCESS RIGHT PROCEDURE: The procedure, risks (including but not limited to bleeding, infection, organ damage ), benefits, and alternatives were explained to the patient. Questions regarding the procedure were encouraged and answered. The patient understands and consents to the procedure. Right upper arm prepped with chlorhexidine, draped in usual sterile fashion. Under real-time ultrasound guidance, the right brachial vein was accessed with a micropuncture 21 gauge Set for right upper extremity venography. Ultrasound documentation was saved. Right venous access and surrounding brachial region prepped and draped in usual sterile fashion. Maximal barrier sterile technique was utilized including caps, mask, sterile gowns, sterile gloves, sterile drape, hand hygiene and skin antiseptic. The right venous access was exchanged over a Bentson wire for a 7 [place] vascular sheath, through which an angled 5 [place] angiographic catheter was advanced and negotiated across the right brachiocephalic venous occlusion using an angled Glidewire. 3000 units heparin given IV. The  catheter was exchanged over an Amplatz wire for a a 12 mm x 4 cm atlas angioplasty balloon, used to dilate the  is obstruction. After final venogram, the catheter was removed and hemostasis achieved at the site. The patient tolerated the procedure well. ANESTHESIA/SEDATION: Intravenous Fentanyl and Versed were administered as conscious sedation during continuous cardiorespiratory monitoring by the radiology RN, with a total moderate sedation time of 16 minutes. MEDICATIONS: Lidocaine 1% subcutaneous CONTRAST:  1 ISOVUE-300 IOPAMIDOL (ISOVUE-300) INJECTION 61%, 1 ISOVUE-300 IOPAMIDOL (ISOVUE-300) INJECTION XX123456 COMPLICATIONS: None immediate FINDINGS: Right upper extremity venography demonstrates patency of right axillary and subclavian veins. There is a high-grade stenosis in the right brachiocephalic vein with right upper extremity drainage via thyrocervical and body wall collaterals. The collateral flow ultimately opacifies the left innominate vein, azygos arch, and SVC, shown to be patent to the right atrium. The right brachiocephalic vein stenosis responded well to the 12 mm balloon angioplasty. Follow-up venogram shows no significant residual or recurrent stenosis. No extravasation, dissection, or other apparent complication. No further filling of collateral channels. IMPRESSION: 1. High-grade right brachiocephalic vein stenosis, which likely accounted for the patient's right upper extremity swelling. 2. Good response of the lesion to 12 mm balloon angioplasty. Consider continued low level anticoagulation to prevent recurrence. Electronically Signed   By: Lucrezia Europe M.D.   On: 10/12/2016 11:18   Ir Pta Venous Except Dialysis Circuit  Result Date: 10/12/2016 CLINICAL DATA:  Sickle cell disease. Right IJ port catheter placement 07/17/2016 due to poor peripheral venous access and recurrent venous access needs. Right arm swelling. Negative Doppler. EXAM: RIGHT EXTREMITY VENOGRAPHY; IR PTA VENOUS EXCEPT DIALYSIS CIRCUIT; IR ULTRASOUND GUIDANCE VASC ACCESS RIGHT PROCEDURE: The procedure, risks (including but not limited to  bleeding, infection, organ damage ), benefits, and alternatives were explained to the patient. Questions regarding the procedure were encouraged and answered. The patient understands and consents to the procedure. Right upper arm prepped with chlorhexidine, draped in usual sterile fashion. Under real-time ultrasound guidance, the right brachial vein was accessed with a micropuncture 21 gauge Set for right upper extremity venography. Ultrasound documentation was saved. Right venous access and surrounding brachial region prepped and draped in usual sterile fashion. Maximal barrier sterile technique was utilized including caps, mask, sterile gowns, sterile gloves, sterile drape, hand hygiene and skin antiseptic. The right venous access was exchanged over a Bentson wire for a 7 [place] vascular sheath, through which an angled 5 [place] angiographic catheter was advanced and negotiated across the right brachiocephalic venous occlusion using an angled Glidewire. 3000 units heparin given IV. The catheter was exchanged over an Amplatz wire for a a 12 mm x 4 cm atlas angioplasty balloon, used to dilate the is obstruction. After final venogram, the catheter was removed and hemostasis achieved at the site. The patient tolerated the procedure well. ANESTHESIA/SEDATION: Intravenous Fentanyl and Versed were administered as conscious sedation during continuous cardiorespiratory monitoring by the radiology RN, with a total moderate sedation time of 16 minutes. MEDICATIONS: Lidocaine 1% subcutaneous CONTRAST:  1 ISOVUE-300 IOPAMIDOL (ISOVUE-300) INJECTION 61%, 1 ISOVUE-300 IOPAMIDOL (ISOVUE-300) INJECTION XX123456 COMPLICATIONS: None immediate FINDINGS: Right upper extremity venography demonstrates patency of right axillary and subclavian veins. There is a high-grade stenosis in the right brachiocephalic vein with right upper extremity drainage via thyrocervical and body wall collaterals. The collateral flow ultimately opacifies the left  innominate vein, azygos arch, and SVC, shown to be patent to the right atrium. The right brachiocephalic vein stenosis responded well to the 12 mm balloon  angioplasty. Follow-up venogram shows no significant residual or recurrent stenosis. No extravasation, dissection, or other apparent complication. No further filling of collateral channels. IMPRESSION: 1. High-grade right brachiocephalic vein stenosis, which likely accounted for the patient's right upper extremity swelling. 2. Good response of the lesion to 12 mm balloon angioplasty. Consider continued low level anticoagulation to prevent recurrence. Electronically Signed   By: Lucrezia Europe M.D.   On: 10/12/2016 11:18    Signed:  Angelica Chessman MD, MHA, FACP, FAAP, CPE   11/06/2016, 7:25 PM

## 2016-11-06 NOTE — H&P (Signed)
[facility] History and Physical  Dawn [name] [id number] DOB: [birth year] DOA: 11/06/2016  PCP: Angelica Chessman, MD   Chief Complaint:   HPI: Dawn [name] is a 36 y.o. female with history of sickle cell disease, HbSS presents to the day hospital today with complaints of pain to lower back and extremities similar to her sickle cell related pain. Current pain intensity is 10/10 described as constant and throbbing. She has been very frequent at the ED and day hospital for the same complain. She was last seen at [facility] ED yesterday where she was worked up and managed for sickle cell pain crisis. She has been taking all other medications consistently, she has taken her home medications MS Cotin and Oxycodone at 7am this morning with no relief. She denies chest pain, shortness of breath, paresthesias, headache, dysuria, nausea, vomiting or diarrhea.    Systemic Review: General: The patient denies anorexia, fever, weight loss Cardiac: Denies chest pain, syncope, palpitations, pedal edema  Respiratory: Denies cough, shortness of breath, wheezing GI: Denies severe indigestion/heartburn, abdominal pain, nausea, vomiting, diarrhea and constipation GU: Denies hematuria, incontinence, dysuria  Musculoskeletal: Denies arthritis  Skin: Denies suspicious skin lesions Neurologic: Denies focal weakness or numbness, change in vision  Past Medical History:  Diagnosis Date  . Blood transfusion   . Demand ischemia ([place]) 02/06/2016  . HCAP (healthcare-associated pneumonia) 05/19/2013  . 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 Pulmonary hypertension 02/06/2016   49 mmHg per echo on 02/06/16   . 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 Sickle cell anemia (HCC)   . Sickle cell disease ([place])   . Sickle cell disease, type S ()   . Vitamin B12 deficiency 07/07/2015    Past Surgical History:  Procedure Laterality Date  .  left knee ACL reconstruction    . CESAREAN SECTION     x 2  .  CHOLECYSTECTOMY    . IR GENERIC HISTORICAL  07/17/2016   IR FLUORO GUIDE PORT INSERTION RIGHT 07/17/2016 Aletta Edouard, MD [facility]  . IR GENERIC HISTORICAL  07/17/2016   IR US GUIDE VASC ACCESS RIGHT 07/17/2016 Aletta Edouard, MD [facility]  . IR GENERIC HISTORICAL  10/12/2016   IR PTA VENOUS EXCEPT DIALYSIS CIRCUIT 10/12/2016 Arne Cleveland, MD [facility]  . IR GENERIC HISTORICAL  10/12/2016   IR US GUIDE VASC ACCESS RIGHT 10/12/2016 Arne Cleveland, MD [facility]  . IR GENERIC HISTORICAL  10/12/2016   IR VENO/EXT/UNI RIGHT 10/12/2016 Arne Cleveland, MD [facility]  . PORT-A-CATH REMOVAL Left 07/29/2013   Procedure: REMOVAL PORT-A-CATH;  Surgeon: Scherry Ran, MD;  Location: [facility] ORS;  Service: General;  Laterality: Left;  . PORTACATH PLACEMENT    . PORTACATH PLACEMENT Left 02/23/2013   Procedure: INSERTION PORT-A-CATH;  Surgeon: Donato Heinz, MD;  Location: [facility] ORS;  Service: General;  Laterality: Left;  . TEE WITHOUT CARDIOVERSION N/A 08/07/2013   Procedure: TRANSESOPHAGEAL ECHOCARDIOGRAM (TEE);  Surgeon: Arnoldo Lenis, MD;  Location: [facility] END[facility] SUITE;  Service: Cardiology;  Laterality: N/A;    Allergies  Allergen Reactions  . Desferal [Deferoxamine] Hives    Local reaction on arm only during infusion. Can take with benadryl   . Latex Other (See Comments)    REACTION: Pt experiences a burning sensation on contacted skin areas  . Lisinopril Other (See Comments) and Cough    REACTION: Sore/scratchy throat  . Tape Other (See Comments)    REACTION: Pt. Experiences a burning sensation on contacted skin areas  .  Tylenol [Acetaminophen] Other (See Comments)    Pt. Does not take the following due to protection of kidney health. Past occurrence of Protein in urine.    Family History  Problem Relation Age of Onset  . Sickle cell trait Mother   . Sickle cell trait Father   . Hypertension Father   . Diabetes Father   . Sickle cell trait Sister   . Cancer Paternal Aunt      Breast      Prior to Admission medications   Medication Sig Start Date End Date Taking? Authorizing Provider  aspirin EC 81 MG EC tablet Take 1 tablet (81 mg total) by mouth daily. 10/18/16  Yes Leana Gamer, MD  folic acid (FOLVITE) 1 MG tablet Take 1 tablet (1 mg total) by mouth daily. 03/14/16  Yes [name], FNP  hydroxyurea (HYDREA) 500 MG capsule Take 2 capsules (1,000 mg total) by mouth daily. May take with food to minimize GI side effects. 03/14/16  Yes [name], FNP  loratadine (CLARITIN) 10 MG tablet Take 10 mg by mouth daily.   Yes Historical Provider, MD  morphine (MS CONTIN) 15 MG 12 hr tablet Take 1 tablet (15 mg total) by mouth every 12 (twelve) hours. 11/01/16  Yes Tresa Garter, MD  Multiple Vitamin (MULTIVITAMIN WITH MINERALS) TABS tablet Take 1 tablet by mouth daily.   Yes Historical Provider, MD  Oxycodone HCl 10 MG TABS Take 1 tablet (10 mg total) by mouth every 4 (four) hours as needed. 11/01/16  Yes Tresa Garter, MD  promethazine (PHENERGAN) 12.5 MG tablet Take 1 tablet (12.5 mg total) by mouth every 6 (six) hours as needed for nausea or vomiting. 11/01/16  Yes Tresa Garter, MD  Vitamin D, Ergocalciferol, (DRISDOL) 50000 units CAPS capsule Take 1 capsule (50,000 Units total) by mouth every 7 (seven) days. Takes on Sundays. 06/01/16  Yes Micheline Chapman, NP  levonorgestrel (MIRENA) 20 MCG/24HR IUD 1 each by Intrauterine route once.    Historical Provider, MD     Physical Exam: Vitals:   11/06/16 0921  BP: 135/74  Pulse: 91  Resp: 18  Temp: 98.2 F (36.8 C)  TempSrc: Oral  SpO2: 97%  Weight: 163 lb (73.9 kg)  Height: 5\' 3"  (1.6 m)    General: Alert, awake, afebrile, anicteric, not in obvious distress HEENT: Normocephalic and Atraumatic, Mucous membranes pink                PERRLA; EOM intact; No scleral icterus,                 Nares: Patent, Oropharynx: Clear, Fair Dentition                 Neck: FROM, no cervical  lymphadenopathy, thyromegaly, carotid bruit or JVD;  CHEST WALL: No tenderness  CHEST: Normal respiration, clear to auscultation bilaterally  HEART: Regular rate and rhythm; no murmurs rubs or gallops  BACK: No kyphosis or scoliosis; no CVA tenderness  ABDOMEN: Positive Bowel Sounds, soft, non-tender; no masses, no organomegaly EXTREMITIES: No cyanosis, clubbing, or edema SKIN:  no rash or ulceration  CNS: Alert and Oriented x 4, Nonfocal exam, CN 2-12 intact  Labs on Admission:  Basic Metabolic Panel:  Recent Labs Lab 10/31/16 1035 11/02/16 0844 11/05/16 0115  NA 139 138 136  K 3.4* 3.7 3.4*  CL 109 108 106  CO2 22 25 23   GLUCOSE 106* 123* 92  BUN 6 <5* 7  CREATININE  0.60 0.54 0.49  CALCIUM 8.3* 8.4* 8.4*   Liver Function Tests:  Recent Labs Lab 11/02/16 0844 11/05/16 0115  AST 129* 113*  ALT 47 39  ALKPHOS 124 128*  BILITOT 13.7* 14.3*  PROT 7.7 7.1  ALBUMIN 2.6* 2.5*   No results for input(s): LIPASE, AMYLASE in the last 168 hours. No results for input(s): AMMONIA in the last 168 hours. CBC:  Recent Labs Lab 10/31/16 1035 11/02/16 0844 11/05/16 0115  WBC  --  5.7 9.2  NEUTROABS  --  2.9 5.4  HGB 7.6* 8.4* 7.5*  HCT 20.6* 22.9* 20.1*  MCV  --  99.1 98.5  PLT  --  169 125*   Cardiac Enzymes: No results for input(s): CKTOTAL, CKMB, CKMBINDEX, TROPONINI in the last 168 hours.  BNP (last 3 results) No results for input(s): BNP in the last 8760 hours.  ProBNP (last 3 results) No results for input(s): PROBNP in the last 8760 hours.  CBG: No results for input(s): GLUCAP in the last 168 hours.   Assessment/Plan Active Problems:   Sickle cell anemia with pain ([place])   Admits to the Day Hospital  IVF D5 .45% Saline @ 125 mls/hour  Weight based Dilaudid PCA started within 30 minutes of admission  IV Toradol 30 mg Q 6 H  Monitor vitals very closely, Re-evaluate pain scale every hour  2 L of Oxygen by [place]  Patient will be re-evaluated for pain  in the context of function and relationship to baseline as care progresses.  If no significant relieve from pain (remains above 5/10) will transfer patient to inpatient services for further evaluation and management  Code Status: Full  Family Communication: None  DVT Prophylaxis: Ambulate as tolerated   Time spent: 31 Minutes  Mariavictoria Nottingham, MD, MHA, FACP, FAAP, CPE  If 7PM-7AM, please contact night-coverage [email or web address] 11/06/2016, 9:38 AM

## 2016-11-06 NOTE — Progress Notes (Signed)
Pt received to the [facility] for treatment. Pt states her pain is in her legs and arms. Her pain # is 9/10. She was treated with IV fluids, Dilaudid PCA and Toradol. Pt's pain was down to 4/10 at discharge. Pt was alert, oriented, and ambulatory at discharge. D/C instructions given to patient with verbal understanding.

## 2016-11-09 ENCOUNTER — Encounter (HOSPITAL_COMMUNITY): Payer: Self-pay | Admitting: Emergency Medicine

## 2016-11-09 ENCOUNTER — Emergency Department (HOSPITAL_COMMUNITY)
Admission: EM | Admit: 2016-11-09 | Discharge: 2016-11-09 | Disposition: A | Discharge status: Home / Self Care | Payer: Medicare Other | Source: Home / Self Care | Attending: Emergency Medicine | Admitting: Emergency Medicine

## 2016-11-09 DIAGNOSIS — Z7982 Long term (current) use of aspirin: Secondary | ICD-10-CM

## 2016-11-09 DIAGNOSIS — T80211A Bloodstream infection due to central venous catheter, initial encounter: Secondary | ICD-10-CM | POA: Diagnosis not present

## 2016-11-09 DIAGNOSIS — G894 Chronic pain syndrome: Secondary | ICD-10-CM | POA: Diagnosis not present

## 2016-11-09 DIAGNOSIS — D5700 Hb-SS disease with crisis, unspecified: Secondary | ICD-10-CM

## 2016-11-09 DIAGNOSIS — I361 Nonrheumatic tricuspid (valve) insufficiency: Secondary | ICD-10-CM | POA: Diagnosis not present

## 2016-11-09 DIAGNOSIS — I272 Pulmonary hypertension, unspecified: Secondary | ICD-10-CM | POA: Diagnosis not present

## 2016-11-09 DIAGNOSIS — Z79899 Other long term (current) drug therapy: Secondary | ICD-10-CM | POA: Insufficient documentation

## 2016-11-09 DIAGNOSIS — R079 Chest pain, unspecified: Secondary | ICD-10-CM | POA: Diagnosis not present

## 2016-11-09 DIAGNOSIS — E875 Hyperkalemia: Secondary | ICD-10-CM | POA: Diagnosis not present

## 2016-11-09 DIAGNOSIS — D57219 Sickle-cell/Hb-C disease with crisis, unspecified: Secondary | ICD-10-CM | POA: Insufficient documentation

## 2016-11-09 LAB — URINALYSIS, ROUTINE W REFLEX MICROSCOPIC
Bilirubin Urine: NEGATIVE
Glucose, UA: NEGATIVE mg/dL
Ketones, ur: NEGATIVE mg/dL
Leukocytes, UA: NEGATIVE
Nitrite: NEGATIVE
PH: 7 (ref 5.0–8.0)
Protein, ur: 30 mg/dL — AB
SPECIFIC GRAVITY, URINE: 1.003 — AB (ref 1.005–1.030)

## 2016-11-09 LAB — COMPREHENSIVE METABOLIC PANEL
ALT: 38 U/L (ref 14–54)
AST: 121 U/L — AB (ref 15–41)
Albumin: 2.3 g/dL — ABNORMAL LOW (ref 3.5–5.0)
Alkaline Phosphatase: 123 U/L (ref 38–126)
Anion gap: 7 (ref 5–15)
BILIRUBIN TOTAL: 13.8 mg/dL — AB (ref 0.3–1.2)
BUN: 5 mg/dL — AB (ref 6–20)
CO2: 23 mmol/L (ref 22–32)
Calcium: 8.2 mg/dL — ABNORMAL LOW (ref 8.9–10.3)
Chloride: 105 mmol/L (ref 101–111)
Creatinine, Ser: 0.48 mg/dL (ref 0.44–1.00)
Glucose, Bld: 149 mg/dL — ABNORMAL HIGH (ref 65–99)
POTASSIUM: 3.1 mmol/L — AB (ref 3.5–5.1)
Sodium: 135 mmol/L (ref 135–145)
TOTAL PROTEIN: 6.9 g/dL (ref 6.5–8.1)

## 2016-11-09 LAB — CBC WITH DIFFERENTIAL/PLATELET
Basophils Absolute: 0 10*3/uL (ref 0.0–0.1)
Basophils Relative: 1 %
Eosinophils Absolute: 0.2 10*3/uL (ref 0.0–0.7)
Eosinophils Relative: 2 %
HEMATOCRIT: 18.7 % — AB (ref 36.0–46.0)
Hemoglobin: 7.1 g/dL — ABNORMAL LOW (ref 12.0–15.0)
LYMPHS ABS: 1.8 10*3/uL (ref 0.7–4.0)
LYMPHS PCT: 25 %
MCH: 35.4 pg — AB (ref 26.0–34.0)
MCHC: 36.9 g/dL — AB (ref 30.0–36.0)
MCV: 95.9 fL (ref 78.0–100.0)
MONOS PCT: 15 %
Monocytes Absolute: 1.1 10*3/uL — ABNORMAL HIGH (ref 0.1–1.0)
NEUTROS ABS: 4.2 10*3/uL (ref 1.7–7.7)
Neutrophils Relative %: 57 %
Platelets: 102 10*3/uL — ABNORMAL LOW (ref 150–400)
RBC: 1.95 MIL/uL — ABNORMAL LOW (ref 3.87–5.11)
RDW: 22.1 % — AB (ref 11.5–15.5)
WBC: 7.3 10*3/uL (ref 4.0–10.5)

## 2016-11-09 LAB — RETICULOCYTES: RBC.: 1.95 MIL/uL — ABNORMAL LOW (ref 3.87–5.11)

## 2016-11-09 MED ORDER — HEPARIN SOD (PORK) LOCK FLUSH 100 UNIT/ML IV SOLN
INTRAVENOUS | Status: AC
  Administered 2016-11-09: 500 [IU]
  Filled 2016-11-09: qty 5

## 2016-11-09 MED ORDER — HYDROMORPHONE HCL 2 MG/ML IJ SOLN
2.0000 mg | Freq: Once | INTRAMUSCULAR | Status: AC
  Administered 2016-11-09: 2 mg via INTRAVENOUS
  Filled 2016-11-09: qty 1

## 2016-11-09 MED ORDER — HYDROMORPHONE HCL 1 MG/ML IJ SOLN
1.0000 mg | Freq: Once | INTRAMUSCULAR | Status: AC
  Administered 2016-11-09: 1 mg via INTRAVENOUS
  Filled 2016-11-09: qty 1

## 2016-11-09 MED ORDER — SODIUM CHLORIDE 0.9 % IV BOLUS (SEPSIS)
2000.0000 mL | Freq: Once | INTRAVENOUS | Status: AC
  Administered 2016-11-09: 2000 mL via INTRAVENOUS

## 2016-11-09 MED ORDER — ONDANSETRON HCL 4 MG/2ML IJ SOLN
4.0000 mg | Freq: Once | INTRAMUSCULAR | Status: AC
  Administered 2016-11-09: 4 mg via INTRAVENOUS
  Filled 2016-11-09: qty 2

## 2016-11-09 MED ORDER — HYDROMORPHONE HCL 2 MG/ML IJ SOLN
2.0000 mg | Freq: Once | INTRAMUSCULAR | Status: AC
Start: 2016-11-09 — End: 2016-11-09
  Administered 2016-11-09: 2 mg via INTRAVENOUS
  Filled 2016-11-09: qty 1

## 2016-11-09 NOTE — ED Provider Notes (Signed)
[facility] DEPT Provider Note   CSN: [id number] Arrival date & time: 11/09/16  0910  By signing my name below, I, Dawn [name], attest that this documentation has been prepared under the direction and in the presence of [name], MD . Electronically Signed: [name], Scribe. 11/09/2016. 9:46 AM.  History   Chief Complaint Chief Complaint  Patient presents with  . Sickle Cell Pain Crisis   The history is provided by the patient. No language interpreter was used.  Sickle Cell Pain Crisis  Location:  Lower extremity, upper extremity and back Severity:  Moderate Duration:  1 day Similar to previous crisis episodes: yes   Progression:  Worsening Chronicity:  Chronic Relieved by:  Prescription drugs Ineffective treatments:  Prescription drugs Associated symptoms: no chest pain, no congestion, no cough, no fatigue and no headaches   Risk factors: frequent admissions for pain    HPI Comments: Dawn [name] is a 36 y.o. female with PMHx of Type S sickle cell disease, pulmonary HTN, and blood transfusion, who presents to the Emergency Department complaining of bilateral leg and arm pain consistent with her sickle cell pain that began yesterday. Pt reports associated lower back pain. She notes she visited [facility] ED on 11/04/16 for similar pain in which she was admitted to [facility] for 2 days. Pt has visited the ED 9 times in the past 6 months for similar pain; she states is admitted to the hospital "about 50% of the time after visiting the ED." She reports she takes 15 mg Morphine every 12 hours and 10 mg Oxycodone every 4-6 hours as needed for her pain with mild relief. Pt denies chest pain, dysuria, and difficulty urinating.   Past Medical History:  Diagnosis Date  . Blood transfusion   . Demand ischemia ([place]) 02/06/2016  . HCAP (healthcare-associated pneumonia) 05/19/2013  . 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 Pulmonary hypertension 02/06/2016   49 mmHg per echo on 02/06/16   . Reactive  depression (situational) 03/28/2012  . Sickle cell anemia (HCC)   . Sickle cell disease ([place])   . Sickle cell disease, type S ([place])   . Vitamin B12 deficiency 07/07/2015   Patient Active Problem List   Diagnosis Date Noted  . Sickle cell anemia with pain ([place]) 11/06/2016  . Syncope 10/17/2016  . Dizziness   . Hypomagnesemia   . Swelling of right upper extremity   . 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 Sickle cell crisis ([place]) 10/03/2016  . Positive D dimer   . Pulmonary hypertension 02/16/2016  . 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 Anemia of chronic disease   . Chronic pain syndrome 10/14/2015  . 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 Vitamin B12 deficiency 07/07/2015  . Thrombocytopenia ([place]) 07/06/2015  . Hyperbilirubinemia 09/11/2014  . Macrocytosis 06/13/2014  . Vitamin D deficiency 11/23/2013  . Hb-SS disease without crisis ([place]) 11/23/2013  . 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 Hypokalemia 03/28/2012  . 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 Elevated LFTs 04/27/2011  . Sickle cell disease ([place]) 04/26/2011   Past Surgical History:  Procedure Laterality Date  .  left knee ACL reconstruction    . CESAREAN SECTION     x 2  . CHOLECYSTECTOMY    . IR GENERIC HISTORICAL  07/17/2016   IR FLUORO GUIDE PORT INSERTION RIGHT 07/17/2016 Aletta Edouard, MD [facility]  . IR GENERIC HISTORICAL  07/17/2016   IR US GUIDE VASC ACCESS RIGHT 07/17/2016 Aletta Edouard, MD [facility]  . IR GENERIC HISTORICAL  10/12/2016   IR PTA VENOUS EXCEPT DIALYSIS CIRCUIT 10/12/2016 Arne Cleveland, MD [facility]  . IR GENERIC HISTORICAL  10/12/2016   IR US GUIDE VASC ACCESS RIGHT 10/12/2016 Arne Cleveland, MD [facility]  . IR GENERIC HISTORICAL  10/12/2016   IR VENO/EXT/UNI RIGHT 10/12/2016 Arne Cleveland, MD [facility]  . PORT-A-CATH REMOVAL Left 07/29/2013   Procedure: REMOVAL PORT-A-CATH;  Surgeon: Scherry Ran, MD;  Location: [facility] ORS;  Service: General;   Laterality: Left;  . PORTACATH PLACEMENT    . PORTACATH PLACEMENT Left 02/23/2013   Procedure: INSERTION PORT-A-CATH;  Surgeon: Donato Heinz, MD;  Location: [facility] ORS;  Service: General;  Laterality: Left;  . TEE WITHOUT CARDIOVERSION N/A 08/07/2013   Procedure: TRANSESOPHAGEAL ECHOCARDIOGRAM (TEE);  Surgeon: Arnoldo Lenis, MD;  Location: [facility];  Service: Cardiology;  Laterality: N/A;    OB History    Gravida Para Term Preterm AB Living   1 1   1   1    SAB TAB Ectopic Multiple Live Births                 Home Medications    Prior to Admission medications   Medication Sig Start Date End Date Taking? Authorizing Provider  aspirin EC 81 MG EC tablet Take 1 tablet (81 mg total) by mouth daily. 10/18/16   Leana Gamer, MD  folic acid (FOLVITE) 1 MG tablet Take 1 tablet (1 mg total) by mouth daily. 03/14/16   [name], FNP  hydroxyurea (HYDREA) 500 MG capsule Take 2 capsules (1,000 mg total) by mouth daily. May take with food to minimize GI side effects. 03/14/16   [name], FNP  levonorgestrel (MIRENA) 20 MCG/24HR IUD 1 each by Intrauterine route once.    Historical Provider, MD  loratadine (CLARITIN) 10 MG tablet Take 10 mg by mouth daily.    Historical Provider, MD  morphine (MS CONTIN) 15 MG 12 hr tablet Take 1 tablet (15 mg total) by mouth every 12 (twelve) hours. 11/01/16   Tresa Garter, MD  Multiple Vitamin (MULTIVITAMIN WITH MINERALS) TABS tablet Take 1 tablet by mouth daily.    Historical Provider, MD  Oxycodone HCl 10 MG TABS Take 1 tablet (10 mg total) by mouth every 4 (four) hours as needed. 11/01/16   Tresa Garter, MD  promethazine (PHENERGAN) 12.5 MG tablet Take 1 tablet (12.5 mg total) by mouth every 6 (six) hours as needed for nausea or vomiting. 11/01/16   Tresa Garter, MD  Vitamin D, Ergocalciferol, (DRISDOL) 50000 units CAPS capsule Take 1 capsule (50,000 Units total) by mouth every 7 (seven) days. Takes on Sundays. 06/01/16   Micheline Chapman, NP    Family History Family History  Problem Relation Age of Onset  . Sickle cell trait Mother   . Sickle cell trait Father   . Hypertension Father   . Diabetes Father   . Sickle cell trait Sister   . Cancer Paternal Aunt     Breast    Social History Social History  Substance Use Topics  . Smoking status: Never Smoker  . Smokeless tobacco: Never Used  . Alcohol use No   Allergies   Desferal [deferoxamine]; Latex; Lisinopril; Tape; and Tylenol [acetaminophen]  Review of Systems Review of Systems  Constitutional: Negative for appetite change and fatigue.  HENT: Negative for congestion, ear discharge and sinus pressure.   Eyes: Negative for discharge.  Respiratory: Negative for cough.   Cardiovascular: Negative for  chest pain.  Gastrointestinal: Negative for abdominal pain and diarrhea.  Genitourinary: Negative for difficulty urinating, dysuria, frequency and hematuria.  Musculoskeletal: Positive for arthralgias and back pain.  Skin: Negative for rash.  Neurological: Negative for seizures and headaches.  Psychiatric/Behavioral: Negative for hallucinations.   Physical Exam Updated Vital Signs BP 145/74 (BP Location: Left Arm)   Pulse 107   Temp 98.6 F (37 C) (Oral)   Resp 16   Ht 5\' 3"  (1.6 m)   Wt 163 lb (73.9 kg)   SpO2 95%   BMI 28.87 kg/m   Physical Exam  Constitutional: She is oriented to person, place, and time. She appears well-developed.  HENT:  Head: Normocephalic.  Eyes: Conjunctivae and EOM are normal. No scleral icterus.  Neck: Neck supple. No thyromegaly present.  Cardiovascular: Normal rate and regular rhythm.  Exam reveals no gallop and no friction rub.   No murmur heard. Pulmonary/Chest: No stridor. She has no wheezes. She has no rales. She exhibits no tenderness.  Abdominal: She exhibits no distension. There is no tenderness. There is no rebound.  Musculoskeletal: Normal range of motion. She exhibits tenderness. She exhibits no  edema.  Moderate lumbar spine tenderness and tenderness to bilateral upper legs   Lymphadenopathy:    She has no cervical adenopathy.  Neurological: She is oriented to person, place, and time. She exhibits normal muscle tone. Coordination normal.  Skin: No rash noted. No erythema.  Psychiatric: She has a normal mood and affect. Her behavior is normal.   ED Treatments / Results  DIAGNOSTIC STUDIES:  Oxygen Saturation is 95% on RA, normal by my interpretation.    COORDINATION OF CARE:  9:40 AM Discussed treatment [name] with pt at bedside and pt agreed to [name].  Labs (all labs ordered are listed, but only abnormal results are displayed) Labs Reviewed - No data to display  EKG  EKG Interpretation None       Radiology No results found.  Procedures Procedures (including critical care time)  Medications Ordered in ED Medications - No data to display  Initial Impression / Assessment and [name] / ED Course  I have reviewed the triage vital signs and the nursing notes.  Pertinent labs & imaging results that were available during my care of the patient were reviewed by me and considered in my medical decision making (see chart for details).    Patient with sickle cell crisis. Patient improved with IV Dilaudid. She will follow-up with her doctor.  The chart was scribed for me under my direct supervision.  I personally performed the history, physical, and medical decision making and all procedures in the evaluation of this patient..   Final Clinical Impressions(s) / ED Diagnoses   Final diagnoses:  None    New Prescriptions New Prescriptions   No medications on file     [name], MD 11/09/16 1402

## 2016-11-09 NOTE — Discharge Instructions (Signed)
Follow-up with your doctor if any more problems. °

## 2016-11-09 NOTE — ED Triage Notes (Signed)
Pt reports sickle cell pain since yesterday, pain in joints, yellow sclera.  Pt alert and oriented.

## 2016-11-11 ENCOUNTER — Emergency Department (HOSPITAL_COMMUNITY): Payer: Medicare Other

## 2016-11-11 ENCOUNTER — Encounter (HOSPITAL_COMMUNITY): Payer: Self-pay | Admitting: Emergency Medicine

## 2016-11-11 ENCOUNTER — Inpatient Hospital Stay (HOSPITAL_COMMUNITY)
Admission: EM | Admit: 2016-11-11 | Discharge: 2016-11-20 | DRG: 314 | Disposition: A | Discharge status: Home / Self Care | Payer: Medicare Other | Attending: Family Medicine | Admitting: Family Medicine

## 2016-11-11 DIAGNOSIS — I361 Nonrheumatic tricuspid (valve) insufficiency: Secondary | ICD-10-CM | POA: Diagnosis present

## 2016-11-11 DIAGNOSIS — I272 Pulmonary hypertension, unspecified: Secondary | ICD-10-CM | POA: Diagnosis not present

## 2016-11-11 DIAGNOSIS — T80218A Other infection due to central venous catheter, initial encounter: Secondary | ICD-10-CM | POA: Diagnosis not present

## 2016-11-11 DIAGNOSIS — Z832 Family history of diseases of the blood and blood-forming organs and certain disorders involving the immune mechanism: Secondary | ICD-10-CM | POA: Diagnosis not present

## 2016-11-11 DIAGNOSIS — Z7982 Long term (current) use of aspirin: Secondary | ICD-10-CM

## 2016-11-11 DIAGNOSIS — D638 Anemia in other chronic diseases classified elsewhere: Secondary | ICD-10-CM | POA: Diagnosis not present

## 2016-11-11 DIAGNOSIS — D5700 Hb-SS disease with crisis, unspecified: Secondary | ICD-10-CM | POA: Diagnosis not present

## 2016-11-11 DIAGNOSIS — E875 Hyperkalemia: Secondary | ICD-10-CM | POA: Diagnosis not present

## 2016-11-11 DIAGNOSIS — Z79899 Other long term (current) drug therapy: Secondary | ICD-10-CM | POA: Diagnosis not present

## 2016-11-11 DIAGNOSIS — I340 Nonrheumatic mitral (valve) insufficiency: Secondary | ICD-10-CM | POA: Diagnosis not present

## 2016-11-11 DIAGNOSIS — B9561 Methicillin susceptible Staphylococcus aureus infection as the cause of diseases classified elsewhere: Secondary | ICD-10-CM | POA: Diagnosis present

## 2016-11-11 DIAGNOSIS — G894 Chronic pain syndrome: Secondary | ICD-10-CM | POA: Diagnosis present

## 2016-11-11 DIAGNOSIS — Z91040 Latex allergy status: Secondary | ICD-10-CM

## 2016-11-11 DIAGNOSIS — Z888 Allergy status to other drugs, medicaments and biological substances status: Secondary | ICD-10-CM | POA: Diagnosis not present

## 2016-11-11 DIAGNOSIS — R7881 Bacteremia: Secondary | ICD-10-CM | POA: Diagnosis not present

## 2016-11-11 DIAGNOSIS — T827XXA Infection and inflammatory reaction due to other cardiac and vascular devices, implants and grafts, initial encounter: Secondary | ICD-10-CM | POA: Diagnosis not present

## 2016-11-11 DIAGNOSIS — Z886 Allergy status to analgesic agent status: Secondary | ICD-10-CM

## 2016-11-11 DIAGNOSIS — Y848 Other medical procedures as the cause of abnormal reaction of the patient, or of later complication, without mention of misadventure at the time of the procedure: Secondary | ICD-10-CM | POA: Diagnosis present

## 2016-11-11 DIAGNOSIS — D571 Sickle-cell disease without crisis: Secondary | ICD-10-CM | POA: Diagnosis not present

## 2016-11-11 DIAGNOSIS — E876 Hypokalemia: Secondary | ICD-10-CM | POA: Diagnosis present

## 2016-11-11 DIAGNOSIS — T80218D Other infection due to central venous catheter, subsequent encounter: Secondary | ICD-10-CM | POA: Diagnosis not present

## 2016-11-11 DIAGNOSIS — Z9049 Acquired absence of other specified parts of digestive tract: Secondary | ICD-10-CM | POA: Diagnosis not present

## 2016-11-11 DIAGNOSIS — Z91048 Other nonmedicinal substance allergy status: Secondary | ICD-10-CM

## 2016-11-11 DIAGNOSIS — T80211A Bloodstream infection due to central venous catheter, initial encounter: Secondary | ICD-10-CM | POA: Diagnosis not present

## 2016-11-11 DIAGNOSIS — R079 Chest pain, unspecified: Secondary | ICD-10-CM | POA: Diagnosis not present

## 2016-11-11 LAB — CBC WITH DIFFERENTIAL/PLATELET
BASOS PCT: 1 %
Basophils Absolute: 0.1 10*3/uL (ref 0.0–0.1)
Eosinophils Absolute: 0 10*3/uL (ref 0.0–0.7)
Eosinophils Relative: 0 %
HEMATOCRIT: 18.2 % — AB (ref 36.0–46.0)
HEMOGLOBIN: 6.5 g/dL — AB (ref 12.0–15.0)
LYMPHS ABS: 1.6 10*3/uL (ref 0.7–4.0)
LYMPHS PCT: 15 %
MCH: 34.9 pg — AB (ref 26.0–34.0)
MCHC: 35.7 g/dL (ref 30.0–36.0)
MCV: 97.8 fL (ref 78.0–100.0)
Monocytes Absolute: 1.7 10*3/uL — ABNORMAL HIGH (ref 0.1–1.0)
Monocytes Relative: 15 %
NEUTROS ABS: 7.5 10*3/uL (ref 1.7–7.7)
NEUTROS PCT: 69 %
Platelets: 114 10*3/uL — ABNORMAL LOW (ref 150–400)
RBC: 1.86 MIL/uL — ABNORMAL LOW (ref 3.87–5.11)
RDW: 22 % — ABNORMAL HIGH (ref 11.5–15.5)
WBC: 10.9 10*3/uL — ABNORMAL HIGH (ref 4.0–10.5)

## 2016-11-11 LAB — COMPREHENSIVE METABOLIC PANEL
ALT: 41 U/L (ref 14–54)
ANION GAP: 7 (ref 5–15)
AST: 138 U/L — ABNORMAL HIGH (ref 15–41)
Albumin: 2.4 g/dL — ABNORMAL LOW (ref 3.5–5.0)
Alkaline Phosphatase: 103 U/L (ref 38–126)
BUN: 7 mg/dL (ref 6–20)
CHLORIDE: 106 mmol/L (ref 101–111)
CO2: 23 mmol/L (ref 22–32)
Calcium: 8.2 mg/dL — ABNORMAL LOW (ref 8.9–10.3)
Creatinine, Ser: 0.47 mg/dL (ref 0.44–1.00)
GFR calc non Af Amer: >60 mL/min (ref >60)
Glucose, Bld: 102 mg/dL — ABNORMAL HIGH (ref 65–99)
POTASSIUM: 3.3 mmol/L — AB (ref 3.5–5.1)
SODIUM: 136 mmol/L (ref 135–145)
Total Bilirubin: 18.2 mg/dL (ref 0.3–1.2)
Total Protein: 7.1 g/dL (ref 6.5–8.1)

## 2016-11-11 LAB — RETICULOCYTES
RBC.: 1.86 MIL/uL — AB (ref 3.87–5.11)
Retic Count, Absolute: 481.7 10*3/uL — ABNORMAL HIGH (ref 19.0–186.0)
Retic Ct Pct: 25.9 % — ABNORMAL HIGH (ref 0.4–3.1)

## 2016-11-11 MED ORDER — FOLIC ACID 1 MG PO TABS
1.0000 mg | ORAL_TABLET | Freq: Every day | ORAL | Status: DC
  Administered 2016-11-12 – 2016-11-20 (×9): 1 mg via ORAL
  Filled 2016-11-11 (×9): qty 1

## 2016-11-11 MED ORDER — KETOROLAC TROMETHAMINE 30 MG/ML IJ SOLN
30.0000 mg | Freq: Four times a day (QID) | INTRAMUSCULAR | Status: AC | PRN
  Administered 2016-11-15: 30 mg via INTRAVENOUS
  Filled 2016-11-11: qty 1

## 2016-11-11 MED ORDER — HYDROMORPHONE HCL 2 MG/ML IJ SOLN
2.0000 mg | INTRAMUSCULAR | Status: AC
  Administered 2016-11-11: 2 mg via INTRAVENOUS
  Filled 2016-11-11: qty 1

## 2016-11-11 MED ORDER — HYDROMORPHONE HCL 1 MG/ML IJ SOLN
1.0000 mg | INTRAMUSCULAR | Status: DC | PRN
  Administered 2016-11-11 – 2016-11-13 (×19): 1 mg via INTRAVENOUS
  Filled 2016-11-11 (×20): qty 1

## 2016-11-11 MED ORDER — PROMETHAZINE HCL 12.5 MG PO TABS
12.5000 mg | ORAL_TABLET | Freq: Four times a day (QID) | ORAL | Status: DC | PRN
  Administered 2016-11-13: 12.5 mg via ORAL
  Filled 2016-11-11: qty 1

## 2016-11-11 MED ORDER — MORPHINE SULFATE ER 15 MG PO TBCR
15.0000 mg | EXTENDED_RELEASE_TABLET | Freq: Two times a day (BID) | ORAL | Status: DC
  Administered 2016-11-11 – 2016-11-20 (×18): 15 mg via ORAL
  Filled 2016-11-11 (×18): qty 1

## 2016-11-11 MED ORDER — SODIUM CHLORIDE 0.45 % IV SOLN
INTRAVENOUS | Status: DC
  Administered 2016-11-11 – 2016-11-13 (×6): via INTRAVENOUS

## 2016-11-11 MED ORDER — HYDROMORPHONE HCL 2 MG/ML IJ SOLN
2.0000 mg | INTRAMUSCULAR | Status: AC
  Filled 2016-11-11 (×2): qty 1

## 2016-11-11 MED ORDER — ADULT MULTIVITAMIN W/MINERALS CH
1.0000 | ORAL_TABLET | Freq: Every day | ORAL | Status: DC
  Administered 2016-11-12 – 2016-11-20 (×9): 1 via ORAL
  Filled 2016-11-11 (×9): qty 1

## 2016-11-11 MED ORDER — ASPIRIN EC 81 MG PO TBEC
81.0000 mg | DELAYED_RELEASE_TABLET | Freq: Every day | ORAL | Status: DC
  Administered 2016-11-12 – 2016-11-20 (×9): 81 mg via ORAL
  Filled 2016-11-11 (×9): qty 1

## 2016-11-11 MED ORDER — HYDROMORPHONE HCL 2 MG/ML IJ SOLN: 2.0000 mg | INTRAMUSCULAR | Status: DC

## 2016-11-11 MED ORDER — DIPHENHYDRAMINE HCL 50 MG/ML IJ SOLN
25.0000 mg | Freq: Once | INTRAMUSCULAR | Status: AC
  Administered 2016-11-11: 25 mg via INTRAVENOUS
  Filled 2016-11-11: qty 1

## 2016-11-11 MED ORDER — HYDROMORPHONE HCL 2 MG/ML IJ SOLN
2.0000 mg | INTRAMUSCULAR | Status: AC
  Administered 2016-11-11: 2 mg via INTRAVENOUS

## 2016-11-11 MED ORDER — HYDROMORPHONE HCL 2 MG/ML IJ SOLN
2.0000 mg | INTRAMUSCULAR | Status: DC
Start: 2016-11-11 — End: 2016-11-11

## 2016-11-11 MED ORDER — HYDROMORPHONE HCL 2 MG/ML IJ SOLN: 2.0000 mg | Freq: Once | INTRAMUSCULAR | Status: DC

## 2016-11-11 MED ORDER — LEVONORGESTREL 20 MCG/24HR IU IUD: 1.0000 | INTRAUTERINE_SYSTEM | Freq: Once | INTRAUTERINE | Status: DC

## 2016-11-11 MED ORDER — SODIUM CHLORIDE 0.9 % IV SOLN
Freq: Once | INTRAVENOUS | Status: AC
  Administered 2016-11-11: 11:00:00 via INTRAVENOUS

## 2016-11-11 MED ORDER — ONDANSETRON HCL 4 MG/2ML IJ SOLN: 4.0000 mg | INTRAMUSCULAR | Status: DC | PRN

## 2016-11-11 MED ORDER — HEPARIN SODIUM (PORCINE) 5000 UNIT/ML IJ SOLN
5000.0000 [IU] | Freq: Three times a day (TID) | INTRAMUSCULAR | Status: DC
  Administered 2016-11-11 – 2016-11-20 (×7): 5000 [IU] via SUBCUTANEOUS
  Filled 2016-11-11 (×16): qty 1

## 2016-11-11 MED ORDER — HYDROXYUREA 500 MG PO CAPS
1000.0000 mg | ORAL_CAPSULE | Freq: Every day | ORAL | Status: DC
  Administered 2016-11-12 – 2016-11-20 (×9): 1000 mg via ORAL
  Filled 2016-11-11 (×9): qty 2

## 2016-11-11 MED ORDER — KETOROLAC TROMETHAMINE 30 MG/ML IJ SOLN
30.0000 mg | INTRAMUSCULAR | Status: AC
  Administered 2016-11-11: 30 mg via INTRAVENOUS
  Filled 2016-11-11: qty 1

## 2016-11-11 MED ORDER — ACETAMINOPHEN 325 MG PO TABS
650.0000 mg | ORAL_TABLET | ORAL | Status: DC | PRN
  Administered 2016-11-11 – 2016-11-13 (×7): 650 mg via ORAL
  Filled 2016-11-11 (×7): qty 2

## 2016-11-11 NOTE — ED Notes (Signed)
CRITICAL VALUE ALERT  Critical value received:  Total bilirubin 18.2 and Hgb 6.5  Date of notification: 11/11/2016  Time of notification:  1109 and 1113  Critical value read back:Yes.    Nurse who received alert:  Domenica Reamer RN   MD notified (1st page):  Alyse Low PA  Time of first page:  1114  MD notified (2nd page):  Time of second page:  Responding MD:  Alyse Low  Time MD responded:  [phone]

## 2016-11-11 NOTE — ED Provider Notes (Signed)
[facility] DEPT Provider Note   CSN: [id number] Arrival date & time: 11/11/16  0810     History   Chief Complaint Chief Complaint  Patient presents with  . Sickle Cell Pain Crisis    HPI Jamayah L Hazard is a 36 y.o. female.  The history is provided by the patient. No language interpreter was used.  Sickle Cell Pain Crisis  Location:  Chest, lower extremity and upper extremity Severity:  Moderate Onset quality:  Gradual Similar to previous crisis episodes: no   Timing:  Constant Progression:  Worsening Relieved by:  Nothing Worsened by:  Nothing Associated symptoms: chest pain   Associated symptoms: no sore throat   Risk factors: no hx of pneumonia   Pt complains of sickle cell crisis pain.  Pt reports this is her typical pain .  Pt has had multiple crisises recently.  Past Medical History:  Diagnosis Date  . Blood transfusion   . Demand ischemia ([place]) 02/06/2016  . HCAP (healthcare-associated pneumonia) 05/19/2013  . 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 Pulmonary hypertension 02/06/2016   49 mmHg per echo on 02/06/16   . 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 Sickle cell anemia (HCC)   . Sickle cell disease ([place])   . 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 Vitamin B12 deficiency 07/07/2015    Patient Active Problem List   Diagnosis Date Noted  . Sickle cell anemia with pain ([place]) 11/06/2016  . Syncope 10/17/2016  . Dizziness   . Hypomagnesemia   . Swelling of right upper extremity   . 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 Sickle cell crisis ([place]) 10/03/2016  . Positive D dimer   . Pulmonary hypertension 02/16/2016  . 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 Anemia of chronic disease   . Chronic pain syndrome 10/14/2015  . 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 Vitamin B12 deficiency 07/07/2015  . Thrombocytopenia ([place]) 07/06/2015  . Hyperbilirubinemia 09/11/2014  . Macrocytosis 06/13/2014  . Vitamin D deficiency 11/23/2013  . Hb-SS disease without crisis  ([place]) 11/23/2013  . 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 Hypokalemia 03/28/2012  . 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 Elevated LFTs 04/27/2011  . Sickle cell disease ([place]) 04/26/2011    Past Surgical History:  Procedure Laterality Date  .  left knee ACL reconstruction    . CESAREAN SECTION     x 2  . CHOLECYSTECTOMY    . IR GENERIC HISTORICAL  07/17/2016   IR FLUORO GUIDE PORT INSERTION RIGHT 07/17/2016 Aletta Edouard, MD [facility]  . IR GENERIC HISTORICAL  07/17/2016   IR US GUIDE VASC ACCESS RIGHT 07/17/2016 Aletta Edouard, MD [facility]  . IR GENERIC HISTORICAL  10/12/2016   IR PTA VENOUS EXCEPT DIALYSIS CIRCUIT 10/12/2016 Arne Cleveland, MD [facility]  . IR GENERIC HISTORICAL  10/12/2016   IR US GUIDE VASC ACCESS RIGHT 10/12/2016 Arne Cleveland, MD [facility]  . IR GENERIC HISTORICAL  10/12/2016   IR VENO/EXT/UNI RIGHT 10/12/2016 Arne Cleveland, MD [facility]  . PORT-A-CATH REMOVAL Left 07/29/2013   Procedure: REMOVAL PORT-A-CATH;  Surgeon: Scherry Ran, MD;  Location: [facility];  Service: General;  Laterality: Left;  . PORTACATH PLACEMENT    . PORTACATH PLACEMENT Left 02/23/2013   Procedure: INSERTION PORT-A-CATH;  Surgeon: Donato Heinz, MD;  Location: [facility];  Service: General;  Laterality: Left;  . TEE WITHOUT CARDIOVERSION N/A 08/07/2013   Procedure: TRANSESOPHAGEAL ECHOCARDIOGRAM (TEE);  Surgeon: Arnoldo Lenis, MD;  Location: [facility];  Service:  Cardiology;  Laterality: N/A;    OB History    Gravida Para Term Preterm AB Living   1 1   1   1    SAB TAB Ectopic Multiple Live Births                   Home Medications    Prior to Admission medications   Medication Sig Start Date End Date Taking? Authorizing Provider  aspirin EC 81 MG EC tablet Take 1 tablet (81 mg total) by mouth daily. 10/18/16  Yes Leana Gamer, MD  folic acid (FOLVITE) 1 MG tablet Take 1 tablet (1 mg total) by mouth daily. 03/14/16  Yes  [name], FNP  hydroxyurea (HYDREA) 500 MG capsule Take 2 capsules (1,000 mg total) by mouth daily. May take with food to minimize GI side effects. 03/14/16  Yes [name], FNP  levonorgestrel (MIRENA) 20 MCG/24HR IUD 1 each by Intrauterine route once.   Yes Historical Provider, MD  loratadine (CLARITIN) 10 MG tablet Take 10 mg by mouth daily.   Yes Historical Provider, MD  morphine (MS CONTIN) 15 MG 12 hr tablet Take 1 tablet (15 mg total) by mouth every 12 (twelve) hours. 11/01/16  Yes Tresa Garter, MD  Multiple Vitamin (MULTIVITAMIN WITH MINERALS) TABS tablet Take 1 tablet by mouth daily.   Yes Historical Provider, MD  Oxycodone HCl 10 MG TABS Take 1 tablet (10 mg total) by mouth every 4 (four) hours as needed. 11/01/16  Yes Tresa Garter, MD  promethazine (PHENERGAN) 12.5 MG tablet Take 1 tablet (12.5 mg total) by mouth every 6 (six) hours as needed for nausea or vomiting. 11/01/16  Yes Tresa Garter, MD  Vitamin D, Ergocalciferol, (DRISDOL) 50000 units CAPS capsule Take 1 capsule (50,000 Units total) by mouth every 7 (seven) days. Takes on Sundays. 06/01/16  Yes Micheline Chapman, NP    Family History Family History  Problem Relation Age of Onset  . Sickle cell trait Mother   . Sickle cell trait Father   . Hypertension Father   . Diabetes Father   . Sickle cell trait Sister   . Cancer Paternal Aunt     Breast    Social History Social History  Substance Use Topics  . Smoking status: Never Smoker  . Smokeless tobacco: Never Used  . Alcohol use No     Allergies   Desferal [deferoxamine]; Latex; Lisinopril; Tape; and Tylenol [acetaminophen]   Review of Systems Review of Systems  HENT: Negative for sore throat.   Cardiovascular: Positive for chest pain.  All other systems reviewed and are negative.    Physical Exam Updated Vital Signs BP 132/84 (BP Location: Left Arm)   Pulse 91   Temp 99.4 F (37.4 C) (Oral)   Resp 16   Ht 5\' 3"  (1.6 m)    Wt 73.9 kg   SpO2 96%   BMI 28.87 kg/m   Physical Exam  Constitutional: She appears well-developed.  HENT:  Head: Normocephalic.  Right Ear: External ear normal.  Left Ear: External ear normal.  Nose: Nose normal.  Eyes: EOM are normal. Pupils are equal, round, and reactive to light. Scleral icterus is present.  Neck: Normal range of motion.  Cardiovascular: Normal rate.   Pulmonary/Chest: Effort normal.  Musculoskeletal: Normal range of motion.  Neurological: She is alert.  Skin: Skin is warm.  Psychiatric: She has a normal mood and affect.  Nursing note and vitals reviewed.    ED  Treatments / Results  Labs (all labs ordered are listed, but only abnormal results are displayed) Labs Reviewed  COMPREHENSIVE METABOLIC PANEL - Abnormal; Notable for the following:       Result Value   Potassium 3.3 (*)    Glucose, Bld 102 (*)    Calcium 8.2 (*)    Albumin 2.4 (*)    AST 138 (*)    Total Bilirubin 18.2 (*)    All other components within normal limits  CBC WITH DIFFERENTIAL/PLATELET - Abnormal; Notable for the following:    WBC 10.9 (*)    RBC 1.86 (*)    Hemoglobin 6.5 (*)    HCT 18.2 (*)    MCH 34.9 (*)    RDW 22.0 (*)    Platelets 114 (*)    Monocytes Absolute 1.7 (*)    All other components within normal limits  RETICULOCYTES - Abnormal; Notable for the following:    Retic Ct Pct 25.9 (*)    RBC. 1.86 (*)    Retic Count, Manual 481.7 (*)    All other components within normal limits    EKG  EKG Interpretation  Date/Time:  Sunday November 11 2016 09:42:39 EST Ventricular Rate:  90 PR Interval:    QRS Duration: 93 QT Interval:  386 QTC Calculation: 473 R Axis:   58 Text Interpretation:  Sinus rhythm Probable left ventricular hypertrophy Confirmed by ZACKOWSKI  MD, SCOTT ([id number]) on 11/11/2016 10:43:10 AM       Radiology No results found.  Procedures Procedures (including critical care time)  Medications Ordered in ED Medications  ondansetron  (ZOFRAN) injection 4 mg (not administered)  HYDROmorphone (DILAUDID) injection 2 mg (not administered)  ketorolac (TORADOL) 30 MG/ML injection 30 mg (30 mg Intravenous Given 11/11/16 1003)  diphenhydrAMINE (BENADRYL) injection 25 mg (25 mg Intravenous Given 11/11/16 1003)  HYDROmorphone (DILAUDID) injection 2 mg (2 mg Intravenous Given 11/11/16 1003)  HYDROmorphone (DILAUDID) injection 2 mg (2 mg Intravenous Given 11/11/16 1123)  0.9 %  sodium chloride infusion ( Intravenous New Bag/Given 11/11/16 1048)     Initial Impression / Assessment and Plan / ED Course  I have reviewed the triage vital signs and the nursing notes.  Pertinent labs & imaging results that were available during my care of the patient were reviewed by me and considered in my medical decision making (see chart for details).     Pt given multiple dosages of pain medication.  Pt has had minimal pain relief.   Pt has increase in bilirubin to 18.2  Hemoblobin is 6.5.   Pt reports she does not normally get transfusion unless hemoglobin is less than 6.0 due to multiple antibodies.  Retic count ios 25.9/481.7  I will consult hospitalist for admission  Final Clinical Impressions(s) / ED Diagnoses   Final diagnoses:  Sickle cell pain crisis [facility])    New Prescriptions New Prescriptions   No medications on file     Fransico Meadow, PA-C 11/11/16 [place], MD 11/12/16 [phone]

## 2016-11-11 NOTE — Progress Notes (Signed)
Temp 102.3. Medicated with Tylenol as ordered.

## 2016-11-11 NOTE — H&P (Signed)
History and Physical    Dawn [name] Husband [id number] DOB: [birth year] DOA: 11/11/2016  PCP: Angelica Chessman, MD  Patient coming from: Home.    Chief Complaint:  Back and extremity pain.   HPI: Dawn [name] is an 36 y.o. female with hx of sickle cell HbSS, presented to the ER with back and leg pain similar to her usual sickle cell crisis.  She has not been doing well since December.  She has no CP, SOB, but described her pain as 9-10/10.  No fever or chills.  Work up included a retic count of 26%,  Hb of 6.5, Plt 114K, and total bili of 18.  Her Cr was 0.47.  She was given several rounds of IV Dilaudid, and hospitalist was asked to admit her for acute HbSS crisis.   ED Course:  See above.  Rewiew of Systems:  Constitutional: Negative for malaise, fever and chills. No significant weight loss or weight gain Eyes: Negative for eye pain, redness and discharge, diplopia, visual changes, or flashes of light. ENMT: Negative for ear pain, hoarseness, nasal congestion, sinus pressure and sore throat. No headaches; tinnitus, drooling, or problem swallowing. Cardiovascular: Negative for chest pain, palpitations, diaphoresis, dyspnea and peripheral edema. ; No orthopnea, PND Respiratory: Negative for cough, hemoptysis, wheezing and stridor. No pleuritic chestpain. Gastrointestinal: Negative for diarrhea, constipation,  melena, blood in stool, hematemesis, jaundice and rectal bleeding.    Genitourinary: Negative for frequency, dysuria, incontinence,flank pain and hematuria; Musculoskeletal: Negative for back pain and neck pain. Negative for swelling and trauma.;  Skin: . Negative for pruritus, rash, abrasions, bruising and skin lesion.; ulcerations Neuro: Negative for headache, lightheadedness and neck stiffness. Negative for weakness, altered level of consciousness , altered mental status, extremity weakness, burning feet, involuntary movement, seizure and syncope.  Psych: negative for anxiety, depression,  insomnia, tearfulness, panic attacks, hallucinations, paranoia, suicidal or homicidal ideation    Past Medical History:  Diagnosis Date  . Blood transfusion   . Demand ischemia ([place]) 02/06/2016  . HCAP (healthcare-associated pneumonia) 05/19/2013  . 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 Pulmonary hypertension 02/06/2016   49 mmHg per echo on 02/06/16   . 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 Sickle cell anemia (HCC)   . Sickle cell disease ([place])   . 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 Vitamin B12 deficiency 07/07/2015    Past Surgical History:  Procedure Laterality Date  .  left knee ACL reconstruction    . CESAREAN SECTION     x 2  . CHOLECYSTECTOMY    . IR GENERIC HISTORICAL  07/17/2016   IR FLUORO GUIDE PORT INSERTION RIGHT 07/17/2016 Aletta Edouard, MD [facility]  . IR GENERIC HISTORICAL  07/17/2016   IR US GUIDE VASC ACCESS RIGHT 07/17/2016 Aletta Edouard, MD [facility]  . IR GENERIC HISTORICAL  10/12/2016   IR PTA VENOUS EXCEPT DIALYSIS CIRCUIT 10/12/2016 Arne Cleveland, MD [facility]  . IR GENERIC HISTORICAL  10/12/2016   IR US GUIDE VASC ACCESS RIGHT 10/12/2016 Arne Cleveland, MD [facility]  . IR GENERIC HISTORICAL  10/12/2016   IR VENO/EXT/UNI RIGHT 10/12/2016 Arne Cleveland, MD [facility]  . PORT-A-CATH REMOVAL Left 07/29/2013   Procedure: REMOVAL PORT-A-CATH;  Surgeon: Scherry Ran, MD;  Location: [facility];  Service: General;  Laterality: Left;  . PORTACATH PLACEMENT    . PORTACATH PLACEMENT Left 02/23/2013   Procedure: INSERTION PORT-A-CATH;  Surgeon: Donato Heinz, MD;  Location: [facility];  Service: General;  Laterality: Left;  .  TEE WITHOUT CARDIOVERSION N/A 08/07/2013   Procedure: TRANSESOPHAGEAL ECHOCARDIOGRAM (TEE);  Surgeon: Arnoldo Lenis, MD;  [facility];  Service: Cardiology;  Laterality: N/A;     reports that she has never smoked. She has never used smokeless tobacco. She reports that she does not drink alcohol or use  drugs.  Allergies  Allergen Reactions  . Desferal [Deferoxamine] Hives    Local reaction on arm only during infusion. Can take with benadryl   . Latex Other (See Comments)    REACTION: Pt experiences a burning sensation on contacted skin areas  . Lisinopril Other (See Comments) and Cough    REACTION: Sore/scratchy throat  . Tape Other (See Comments)    REACTION: Pt. Experiences a burning sensation on contacted skin areas  . Tylenol [Acetaminophen] Other (See Comments)    Pt. Does not take the following due to protection of kidney health. Past occurrence of Protein in urine.    Family History  Problem Relation Age of Onset  . Sickle cell trait Mother   . Sickle cell trait Father   . Hypertension Father   . Diabetes Father   . Sickle cell trait Sister   . Cancer Paternal Aunt     Breast     Prior to Admission medications   Medication Sig Start Date End Date Taking? Authorizing Provider  aspirin EC 81 MG EC tablet Take 1 tablet (81 mg total) by mouth daily. 10/18/16  Yes Leana Gamer, MD  folic acid (FOLVITE) 1 MG tablet Take 1 tablet (1 mg total) by mouth daily. 03/14/16  Yes [name], FNP  hydroxyurea (HYDREA) 500 MG capsule Take 2 capsules (1,000 mg total) by mouth daily. May take with food to minimize GI side effects. 03/14/16  Yes [name], FNP  levonorgestrel (MIRENA) 20 MCG/24HR IUD 1 each by Intrauterine route once.   Yes Historical Provider, MD  loratadine (CLARITIN) 10 MG tablet Take 10 mg by mouth daily.   Yes Historical Provider, MD  morphine (MS CONTIN) 15 MG 12 hr tablet Take 1 tablet (15 mg total) by mouth every 12 (twelve) hours. 11/01/16  Yes Tresa Garter, MD  Multiple Vitamin (MULTIVITAMIN WITH MINERALS) TABS tablet Take 1 tablet by mouth daily.   Yes Historical Provider, MD  Oxycodone HCl 10 MG TABS Take 1 tablet (10 mg total) by mouth every 4 (four) hours as needed. 11/01/16  Yes Tresa Garter, MD  promethazine (PHENERGAN) 12.5 MG  tablet Take 1 tablet (12.5 mg total) by mouth every 6 (six) hours as needed for nausea or vomiting. 11/01/16  Yes Tresa Garter, MD  Vitamin D, Ergocalciferol, (DRISDOL) 50000 units CAPS capsule Take 1 capsule (50,000 Units total) by mouth every 7 (seven) days. Takes on Sundays. 06/01/16  Yes Micheline Chapman, NP    Physical Exam: Vitals:   11/11/16 1300 11/11/16 1330 11/11/16 1406 11/11/16 1430  BP: 129/79 97/79 120/76 150/85  Pulse: 93 92 89 89  Resp: 15 18 18 17   Temp:      TempSrc:      SpO2: 92% 93% 94% 94%  Weight:      Height:          Constitutional: NAD, calm, comfortable Vitals:   11/11/16 1300 11/11/16 1330 11/11/16 1406 11/11/16 1430  BP: 129/79 97/79 120/76 150/85  Pulse: 93 92 89 89  Resp: 15 18 18 17   Temp:      TempSrc:      SpO2: 92%  93% 94% 94%  Weight:      Height:       Eyes: PERRL, lids and conjunctivae normal ENMT: Mucous membranes are moist. Posterior pharynx clear of any exudate or lesions.Normal dentition.  Neck: normal, supple, no masses, no thyromegaly Respiratory: clear to auscultation bilaterally, no wheezing, no crackles. Normal respiratory effort. No accessory muscle use.  Cardiovascular: Regular rate and rhythm, no murmurs / rubs / gallops. No extremity edema. 2+ pedal pulses. No carotid bruits.  Abdomen: no tenderness, no masses palpated. No hepatosplenomegaly. Bowel sounds positive.  Musculoskeletal: no clubbing / cyanosis. No joint deformity upper and lower extremities. Good ROM, no contractures. Normal muscle tone.  Skin: no rashes, lesions, ulcers. No induration Neurologic: CN 2-12 grossly intact. Sensation intact, DTR normal. Strength 5/5 in all 4.  Psychiatric: Normal judgment and insight. Alert and oriented x 3. Normal mood.   Labs on Admission: I have personally reviewed following labs and imaging studies  CBC:  Recent Labs Lab 11/05/16 0115 11/09/16 1032 11/11/16 0934  WBC 9.2 7.3 10.9*  NEUTROABS 5.4 4.2 7.5  HGB 7.5*  7.1* 6.5*  HCT 20.1* 18.7* 18.2*  MCV 98.5 95.9 97.8  PLT 125* 102* [id number]*   Basic Metabolic Panel:  Recent Labs Lab 11/05/16 0115 11/09/16 1032 11/11/16 0934  NA 136 135 136  K 3.4* 3.1* 3.3*  CL 106 105 106  CO2 23 23 23   GLUCOSE 92 149* 102*  BUN 7 5* 7  CREATININE 0.49 0.48 0.47  CALCIUM 8.4* 8.2* 8.2*   GFR: Estimated Creatinine Clearance: 94.5 mL/min (by C-G formula based on SCr of 0.47 mg/dL). Liver Function Tests:  Recent Labs Lab 11/05/16 0115 11/09/16 1032 11/11/16 0934  AST 113* 121* 138*  ALT 39 38 41  ALKPHOS 128* 123 103  BILITOT 14.3* 13.8* 18.2*  PROT 7.1 6.9 7.1  ALBUMIN 2.5* 2.3* 2.4*   Anemia Panel:  Recent Labs  11/09/16 1032 11/11/16 0934  RETICCTPCT >23.0* 25.9*   Urine analysis:    Component Value Date/Time   COLORURINE YELLOW 11/09/2016 [place] 11/09/2016 1235   LABSPEC 1.003 (L) 11/09/2016 1235   PHURINE 7.0 11/09/2016 1235   GLUCOSEU NEGATIVE 11/09/2016 1235   HGBUR MODERATE (A) 11/09/2016 1235   BILIRUBINUR NEGATIVE 11/09/2016 1235   KETONESUR NEGATIVE 11/09/2016 1235   PROTEINUR 30 (A) 11/09/2016 1235   UROBILINOGEN >=8.0 09/07/2016 1004   NITRITE NEGATIVE 11/09/2016 1235   LEUKOCYTESUR NEGATIVE 11/09/2016 1235    Radiological Exams on Admission: Dg Chest 2 View  Result Date: 11/11/2016 CLINICAL DATA:  Chest pain.  Sickle cell disease. EXAM: CHEST  2 VIEW COMPARISON:  Chest radiograph October 02, 2016 and chest CT October 03, 2016 FINDINGS: No edema or consolidation. Heart is mildly enlarged with pulmonary venous hypertension. No adenopathy evident. Port-A-Cath tip is in the superior vena cava. No pneumothorax. No bone lesions evident. IMPRESSION: Evidence of pulmonary vascular congestion without edema or consolidation. No evident adenopathy. Port-A-Cath tip in superior vena cava. Electronically Signed   By: Lowella Grip III M.D.   On: 11/11/2016 12:01    EKG: Independently reviewed.    Assessment/Plan Principal Problem:   Sickle cell crisis (HCC) Active Problems:   Anemia of chronic disease   Pulmonary hypertension    PLAN:   Sickle cell crisis: Will admit her for hypotonic IVF, IV pain control, oxygen as required.  Will hold off on transfusion at this time.  Recheck H and H in the morning.  Will continue with  her hydrea and Folate.  She is stable, full code, and will be admitted to telemetry.    DVT prophylaxis: subQ Heparin.  Code Status: FULL CODE.  Family Communication: None.  Disposition Plan: Home when appropriate.  Consults called: None.  Admission status: inpatient.    Laurajean Hosek MD FACP. [facility]  If 7PM-7AM, please contact night-coverage [email or web address] Password [email or web address]  11/11/2016, 2:42 PM

## 2016-11-12 LAB — BLOOD CULTURE ID PANEL (REFLEXED)
Acinetobacter baumannii: NOT DETECTED
CANDIDA KRUSEI: NOT DETECTED
Candida albicans: NOT DETECTED
Candida glabrata: NOT DETECTED
Candida parapsilosis: NOT DETECTED
Candida tropicalis: NOT DETECTED
ENTEROCOCCUS SPECIES: NOT DETECTED
Enterobacter cloacae complex: NOT DETECTED
Enterobacteriaceae species: NOT DETECTED
Escherichia coli: NOT DETECTED
HAEMOPHILUS INFLUENZAE: NOT DETECTED
Klebsiella oxytoca: NOT DETECTED
Klebsiella pneumoniae: NOT DETECTED
LISTERIA MONOCYTOGENES: NOT DETECTED
Methicillin resistance: NOT DETECTED
NEISSERIA MENINGITIDIS: NOT DETECTED
PROTEUS SPECIES: NOT DETECTED
Pseudomonas aeruginosa: NOT DETECTED
SERRATIA MARCESCENS: NOT DETECTED
STAPHYLOCOCCUS AUREUS BCID: DETECTED — AB
STAPHYLOCOCCUS SPECIES: DETECTED — AB
STREPTOCOCCUS AGALACTIAE: NOT DETECTED
STREPTOCOCCUS SPECIES: NOT DETECTED
Streptococcus pneumoniae: NOT DETECTED
Streptococcus pyogenes: NOT DETECTED

## 2016-11-12 LAB — CBC
HEMATOCRIT: 17.9 % — AB (ref 36.0–46.0)
Hemoglobin: 6.7 g/dL — CL (ref 12.0–15.0)
MCH: 35.8 pg — ABNORMAL HIGH (ref 26.0–34.0)
MCHC: 37.4 g/dL — ABNORMAL HIGH (ref 30.0–36.0)
MCV: 95.7 fL (ref 78.0–100.0)
PLATELETS: 99 10*3/uL — AB (ref 150–400)
RBC: 1.87 MIL/uL — ABNORMAL LOW (ref 3.87–5.11)
RDW: 19.7 % — AB (ref 11.5–15.5)
WBC: 12.1 10*3/uL — AB (ref 4.0–10.5)

## 2016-11-12 LAB — INFLUENZA PANEL BY PCR (TYPE A & B)
INFLAPCR: NEGATIVE
INFLBPCR: NEGATIVE

## 2016-11-12 LAB — BASIC METABOLIC PANEL
ANION GAP: 6 (ref 5–15)
BUN: 6 mg/dL (ref 6–20)
CALCIUM: 7.7 mg/dL — AB (ref 8.9–10.3)
CO2: 22 mmol/L (ref 22–32)
Chloride: 104 mmol/L (ref 101–111)
Creatinine, Ser: 0.47 mg/dL (ref 0.44–1.00)
Glucose, Bld: 125 mg/dL — ABNORMAL HIGH (ref 65–99)
POTASSIUM: 3.1 mmol/L — AB (ref 3.5–5.1)
Sodium: 132 mmol/L — ABNORMAL LOW (ref 135–145)

## 2016-11-12 LAB — HIV ANTIBODY (ROUTINE TESTING W REFLEX): HIV SCREEN 4TH GENERATION: NONREACTIVE

## 2016-11-12 MED ORDER — VANCOMYCIN HCL 10 G IV SOLR
1500.0000 mg | Freq: Once | INTRAVENOUS | Status: AC
  Administered 2016-11-12: 1500 mg via INTRAVENOUS
  Filled 2016-11-12: qty 1500

## 2016-11-12 MED ORDER — OSELTAMIVIR PHOSPHATE 75 MG PO CAPS
75.0000 mg | ORAL_CAPSULE | Freq: Two times a day (BID) | ORAL | Status: DC
  Administered 2016-11-12 – 2016-11-13 (×3): 75 mg via ORAL
  Filled 2016-11-12 (×3): qty 1

## 2016-11-12 MED ORDER — PIPERACILLIN-TAZOBACTAM 3.375 G IVPB
3.3750 g | Freq: Three times a day (TID) | INTRAVENOUS | Status: DC
  Administered 2016-11-12 – 2016-11-13 (×3): 3.375 g via INTRAVENOUS
  Filled 2016-11-12 (×3): qty 50

## 2016-11-12 MED ORDER — VANCOMYCIN HCL IN DEXTROSE 1-5 GM/200ML-% IV SOLN
1000.0000 mg | Freq: Two times a day (BID) | INTRAVENOUS | Status: DC
  Administered 2016-11-13: 1000 mg via INTRAVENOUS
  Filled 2016-11-12: qty 200

## 2016-11-12 NOTE — Progress Notes (Signed)
Pharmacy Antibiotic Note  Dawn [name] is a 35 y.o. female admitted on 11/11/2016 with suspected bacteremia / fever.  Pharmacy has been consulted for [place] dosing.  Plan: Vancomycin 1500mg  x 1 then 1000mg  IV q12hrs Zosyn 3.375gm IV q8h, EID Monitor labs, progress, c/s  Height: 5\' 3"  (160 cm) Weight: 163 lb (73.9 kg) IBW/kg (Calculated) : 52.4  Temp (24hrs), Avg:100.5 F (38.1 C), Min:98.1 F (36.7 C), Max:103.1 F (39.5 C)   Recent Labs Lab 11/09/16 1032 11/11/16 0934 11/12/16 0639  WBC 7.3 10.9* 12.1*  CREATININE 0.48 0.47 0.47    Estimated Creatinine Clearance: 94.5 mL/min (by C-G formula based on SCr of 0.47 mg/dL).    Allergies  Allergen Reactions  . Desferal [Deferoxamine] Hives    Local reaction on arm only during infusion. Can take with benadryl   . Latex Other (See Comments)    REACTION: Pt experiences a burning sensation on contacted skin areas  . Lisinopril Other (See Comments) and Cough    REACTION: Sore/scratchy throat  . Tape Other (See Comments)    REACTION: Pt. Experiences a burning sensation on contacted skin areas  . Tylenol [Acetaminophen] Other (See Comments)    Pt. Does not take the following due to protection of kidney health. Past occurrence of Protein in urine.    Antimicrobials this admission: Vanc 2/19 >>  Zosyn 2/19 >>   Dose adjustments this admission:  Microbiology results:  BCx: pending  UCx: pending   Sputum:    MRSA PCR:   Thank you for allowing pharmacy to be a part of this patient's care.  Hart Robinsons A 11/12/2016 12:47 PM

## 2016-11-12 NOTE — Progress Notes (Signed)
PROGRESS NOTE    Dawn [name]  [id number] DOB: 1980-10-12 DOA: 11/11/2016 PCP: Angelica Chessman, MD    Brief Narrative:  Dawn [name] is an 36 y.o. female with hx of sickle cell HbSS, presented to the ER with back and leg pain similar to her usual sickle cell crisis.  She has not been doing well since December.  She has no CP, SOB, but described her pain as 9-10/10.  No fever or chills.  Work up included a retic count of 26%,  Hb of 6.5, Plt 114K, and total bili of 18.  Her Cr was 0.47.  She was given several rounds of IV Dilaudid, and hospitalist was asked to admit her for acute HbSS crisis. After admission, she spike a fever of 102.  Blood cultures were obtained, and she was given tylenol.  This morning, she has some chills.     Assessment & Plan:   Principal Problem:   Sickle cell crisis (HCC) Active Problems:   Anemia of chronic disease   Pulmonary hypertension   1. Sickle cell crisis:  Will continue with hypotonic fluid.  Hb low but stable.  Continue with current Tx 2.   Fever:  Could be from crisis.  Will obtain a set of blood culture thru the port.  Start Tamiflu as no bacterial source is obvious.  3.   Anemia:  Stable.  Doesn't require transfusion at this time.    DVT prophylaxis: Sub Q heparin.  Code Status: FULL CODE.  Family Communication: None.  Disposition Plan: Home when appropriate.   Consultants:   None.   Procedures:   None.   Antimicrobials: Anti-infectives    Start     Dose/Rate Route Frequency Ordered Stop   11/12/16 1000  oseltamivir (TAMIFLU) capsule 75 mg     75 mg Oral 2 times daily 11/12/16 0811 11/17/16 0959       Subjective:  Having some chills.  No coughs.    Objective: Vitals:   11/11/16 1731 11/11/16 2226 11/12/16 0053 11/12/16 0542  BP:  134/63  110/69  Pulse:  95  90  Resp:  20  16  Temp: (!) 102.3 F (39.1 C) 100.1 F (37.8 C) (!) 102.1 F (38.9 C) 98.6 F (37 C)  TempSrc: Oral Oral  Oral  SpO2:  99%  100%  Weight:        Height:        Intake/Output Summary (Last 24 hours) at 11/12/16 0841 Last data filed at 11/12/16 0600  Gross per 24 hour  Intake           3102.5 ml  Output                0 ml  Net           3102.5 ml   Filed Weights   11/11/16 0818 11/11/16 1533  Weight: 73.9 kg (163 lb) 73.9 kg (163 lb)    Examination:  General exam: Appears calm and comfortable  Respiratory system: Clear to auscultation. Respiratory effort normal. Cardiovascular system: S1 & S2 heard, RRR. No JVD, murmurs, rubs, gallops or clicks. No pedal edema. Gastrointestinal system: Abdomen is nondistended, soft and nontender. No organomegaly or masses felt. Normal bowel sounds heard. Central nervous system: Alert and oriented. No focal neurological deficits. Extremities: Symmetric 5 x 5 power. Skin: No rashes, lesions or ulcers Psychiatry: Judgement and insight appear normal. Mood & affect appropriate.   Data Reviewed: I have personally reviewed following labs and imaging  studies  CBC:  Recent Labs Lab 11/09/16 1032 11/11/16 0934 11/12/16 0639  WBC 7.3 10.9* 12.1*  NEUTROABS 4.2 7.5  --   HGB 7.1* 6.5* 6.7*  HCT 18.7* 18.2* 17.9*  MCV 95.9 97.8 95.7  PLT 102* 114* 99*   Basic Metabolic Panel:  Recent Labs Lab 11/09/16 1032 11/11/16 0934 11/12/16 0639  NA 135 136 132*  K 3.1* 3.3* 3.1*  CL 105 106 104  CO2 23 23 22   GLUCOSE 149* 102* 125*  BUN 5* 7 6  CREATININE 0.48 0.47 0.47  CALCIUM 8.2* 8.2* 7.7*   GFR: Estimated Creatinine Clearance: 94.5 mL/min (by C-G formula based on SCr of 0.47 mg/dL). Liver Function Tests:  Recent Labs Lab 11/09/16 1032 11/11/16 0934  AST 121* 138*  ALT 38 41  ALKPHOS 123 103  BILITOT 13.8* 18.2*  PROT 6.9 7.1  ALBUMIN 2.3* 2.4*   Anemia Panel:  Recent Labs  11/09/16 1032 11/11/16 0934  RETICCTPCT >23.0* 25.9*   Sepsis Labs: No results for input(s): PROCALCITON, LATICACIDVEN in the last 168 hours.  Recent Results (from the past 240 hour(s))   Culture, blood (Routine X 2) w Reflex to ID Panel     Status: None (Preliminary result)   Collection Time: 11/11/16  6:43 PM  Result Value Ref Range Status   Specimen Description BLOOD RIGHT HAND  Final   Special Requests BOTTLES DRAWN AEROBIC AND ANAEROBIC [facility] EACH  Final   Culture PENDING  Incomplete   Report Status PENDING  Incomplete  Culture, blood (Routine X 2) w Reflex to ID Panel     Status: None (Preliminary result)   Collection Time: 11/11/16  6:45 PM  Result Value Ref Range Status   Specimen Description BLOOD LEFT HAND  Final   Special Requests BOTTLES DRAWN AEROBIC AND ANAEROBIC [facility] EACH  Final   Culture PENDING  Incomplete   Report Status PENDING  Incomplete     Radiology Studies: Dg Chest 2 View  Result Date: 11/11/2016 CLINICAL DATA:  Chest pain.  Sickle cell disease. EXAM: CHEST  2 VIEW COMPARISON:  Chest radiograph October 02, 2016 and chest CT October 03, 2016 FINDINGS: No edema or consolidation. Heart is mildly enlarged with pulmonary venous hypertension. No adenopathy evident. Port-A-Cath tip is in the superior vena cava. No pneumothorax. No bone lesions evident. IMPRESSION: Evidence of pulmonary vascular congestion without edema or consolidation. No evident adenopathy. Port-A-Cath tip in superior vena cava. Electronically Signed   By: Lowella Grip III M.D.   On: 11/11/2016 12:01    Scheduled Meds: . aspirin EC  81 mg Oral Daily  . folic acid  1 mg Oral Daily  . heparin  5,000 Units Subcutaneous Q8H  . hydroxyurea  1,000 mg Oral Daily  . morphine  15 mg Oral Q12H  . multivitamin with minerals  1 tablet Oral Daily  . oseltamivir  75 mg Oral BID   Continuous Infusions: . sodium chloride 150 mL/hr at 11/12/16 0330     LOS: 1 day   Bentleigh Waren, MD FACP Hospitalist.   If 7PM-7AM, please contact night-coverage [email or web address] Password [facility] 11/12/2016, 8:41 AM

## 2016-11-12 NOTE — Progress Notes (Signed)
CRITICAL VALUE ALERT  Critical value received:  Aerobic and Anaerobic Blood Cultures positive for gram + cocci in clusters  Date of notification:  11/12/16  Time of notification:  1112  Critical value read back:Yes.    Nurse who received alert:  Sharyn Blitz, RN  MD notified (1st page):  Dr. Marin Comment  Time of first page:  1121  MD notified (2nd page):  Time of second page:  Responding MD:   Time MD responded:

## 2016-11-12 NOTE — Progress Notes (Signed)
PHARMACY - PHYSICIAN COMMUNICATION CRITICAL VALUE ALERT - BLOOD CULTURE IDENTIFICATION (BCID)  Recent Results (from the past 240 hour(s))  Culture, blood (Routine X 2) w Reflex to ID Panel     Status: None (Preliminary result)   Collection Time: 11/11/16  6:43 PM  Result Value Ref Range Status   Specimen Description BLOOD RIGHT HAND  Final   Special Requests BOTTLES DRAWN AEROBIC AND ANAEROBIC [facility] EACH  Final   Culture  Setup Time   Final    GRAM POSITIVE COCCI IN CLUSTERS Gram Stain Report Called to,Read Back By and Verified With: MAYS J. AT 1115A ON [id number] BY THOMPSON S. AEROBIC BOTTLE ONLY Performed at [facility], [place] [address], [place], [place] [address]    Culture GRAM POSITIVE COCCI  Final   Report Status PENDING  Incomplete  Culture, blood (Routine X 2) w Reflex to ID Panel     Status: None (Preliminary result)   Collection Time: 11/11/16  6:45 PM  Result Value Ref Range Status   Specimen Description BLOOD LEFT HAND  Final   Special Requests BOTTLES DRAWN AEROBIC AND ANAEROBIC [facility] EACH  Final   Culture  Setup Time   Final    GRAM POSITIVE COCCI IN CLUSTERS Gram Stain Report Called to,Read Back By and Verified With: MAYS J. AT 1115A ON [id number] BY THOMPSON S. IN BOTH AEROBIC AND ANAEROBIC BOTTLES Organism ID to follow Performed at [facility], [place] [address] [place],  [address]    Culture GRAM POSITIVE COCCI  Final   Report Status PENDING  Incomplete  Culture, blood (Routine X 2) w Reflex to ID Panel     Status: None (Preliminary result)   Collection Time: 11/12/16  8:22 AM  Result Value Ref Range Status   Specimen Description PORTA CATH DRAWN BY RN  Final   Special Requests BOTTLES DRAWN AEROBIC AND ANAEROBIC 6CC  Final   Culture PENDING  Incomplete   Report Status PENDING  Incomplete  Culture, blood (Routine X 2) w Reflex to ID Panel     Status: None (Preliminary result)   Collection Time: 11/12/16  8:44 AM  Result Value Ref Range Status   Specimen Description PORTA CATH DRAWN BY RN  Final   Special Requests BOTTLES DRAWN AEROBIC AND ANAEROBIC [place]  Final   Culture PENDING  Incomplete   Report Status PENDING  Incomplete   Name of physician:  Dr Marin Comment  Changes to prescribed antibiotics required: Continue Vancomycin and Zosyn, f/u ID and sens  Hart Robinsons A 11/12/2016  4:33 PM

## 2016-11-13 LAB — COMPREHENSIVE METABOLIC PANEL
ALBUMIN: 2 g/dL — AB (ref 3.5–5.0)
ALT: 31 U/L (ref 14–54)
ANION GAP: 5 (ref 5–15)
AST: 94 U/L — ABNORMAL HIGH (ref 15–41)
Alkaline Phosphatase: 83 U/L (ref 38–126)
BILIRUBIN TOTAL: 14.9 mg/dL — AB (ref 0.3–1.2)
BUN: 5 mg/dL — AB (ref 6–20)
CALCIUM: 7.7 mg/dL — AB (ref 8.9–10.3)
CO2: 23 mmol/L (ref 22–32)
CREATININE: 0.38 mg/dL — AB (ref 0.44–1.00)
Chloride: 104 mmol/L (ref 101–111)
GFR calc Af Amer: >60 mL/min (ref >60)
GFR calc non Af Amer: >60 mL/min (ref >60)
GLUCOSE: 128 mg/dL — AB (ref 65–99)
Potassium: 2.8 mmol/L — ABNORMAL LOW (ref 3.5–5.1)
Sodium: 132 mmol/L — ABNORMAL LOW (ref 135–145)
TOTAL PROTEIN: 6.4 g/dL — AB (ref 6.5–8.1)

## 2016-11-13 LAB — CBC WITH DIFFERENTIAL/PLATELET
BASOS ABS: 0 10*3/uL (ref 0.0–0.1)
BASOS PCT: 0 %
Eosinophils Absolute: 0.4 10*3/uL (ref 0.0–0.7)
Eosinophils Relative: 4 %
HEMATOCRIT: 16.8 % — AB (ref 36.0–46.0)
HEMOGLOBIN: 6.2 g/dL — AB (ref 12.0–15.0)
LYMPHS PCT: 21 %
Lymphs Abs: 2 10*3/uL (ref 0.7–4.0)
MCH: 35.4 pg — ABNORMAL HIGH (ref 26.0–34.0)
MCHC: 36.9 g/dL — AB (ref 30.0–36.0)
MCV: 96 fL (ref 78.0–100.0)
MONOS PCT: 10 %
Monocytes Absolute: 1 10*3/uL (ref 0.1–1.0)
NEUTROS ABS: 6.3 10*3/uL (ref 1.7–7.7)
Neutrophils Relative %: 65 %
Platelets: 99 10*3/uL — ABNORMAL LOW (ref 150–400)
RBC: 1.75 MIL/uL — ABNORMAL LOW (ref 3.87–5.11)
RDW: 19.7 % — ABNORMAL HIGH (ref 11.5–15.5)
WBC: 9.7 10*3/uL (ref 4.0–10.5)

## 2016-11-13 LAB — SURGICAL PCR SCREEN
MRSA, PCR: NEGATIVE
Staphylococcus aureus: NEGATIVE

## 2016-11-13 LAB — PREPARE RBC (CROSSMATCH)

## 2016-11-13 MED ORDER — POTASSIUM CHLORIDE 10 MEQ/100ML IV SOLN
10.0000 meq | INTRAVENOUS | Status: AC
  Administered 2016-11-13 (×3): 10 meq via INTRAVENOUS
  Filled 2016-11-13 (×3): qty 100

## 2016-11-13 MED ORDER — CHLORHEXIDINE GLUCONATE CLOTH 2 % EX PADS
6.0000 | MEDICATED_PAD | Freq: Once | CUTANEOUS | Status: AC
  Administered 2016-11-13: 6 via TOPICAL

## 2016-11-13 MED ORDER — HYDROMORPHONE HCL 1 MG/ML IJ SOLN
2.0000 mg | INTRAMUSCULAR | Status: DC | PRN
  Administered 2016-11-13 (×6): 2 mg via INTRAVENOUS
  Filled 2016-11-13 (×6): qty 2

## 2016-11-13 MED ORDER — HYDROMORPHONE HCL 2 MG/ML IJ SOLN
2.0000 mg | INTRAMUSCULAR | Status: DC | PRN
  Administered 2016-11-13 – 2016-11-16 (×22): 2 mg via INTRAVENOUS
  Filled 2016-11-13 (×22): qty 1

## 2016-11-13 MED ORDER — POTASSIUM CHLORIDE CRYS ER 20 MEQ PO TBCR
40.0000 meq | EXTENDED_RELEASE_TABLET | Freq: Every day | ORAL | Status: DC
  Administered 2016-11-13 – 2016-11-18 (×6): 40 meq via ORAL
  Filled 2016-11-13 (×6): qty 2

## 2016-11-13 MED ORDER — POTASSIUM CHLORIDE IN NACL 20-0.45 MEQ/L-% IV SOLN
INTRAVENOUS | Status: DC
  Administered 2016-11-13 – 2016-11-18 (×10): via INTRAVENOUS
  Filled 2016-11-13 (×19): qty 1000

## 2016-11-13 MED ORDER — SODIUM CHLORIDE 0.9 % IV SOLN: Freq: Once | INTRAVENOUS | Status: DC

## 2016-11-13 MED ORDER — CEFAZOLIN SODIUM-DEXTROSE 2-4 GM/100ML-% IV SOLN
2.0000 g | Freq: Three times a day (TID) | INTRAVENOUS | Status: DC
  Administered 2016-11-13 – 2016-11-20 (×23): 2 g via INTRAVENOUS
  Filled 2016-11-13 (×25): qty 100

## 2016-11-13 MED ORDER — CHLORHEXIDINE GLUCONATE CLOTH 2 % EX PADS
6.0000 | MEDICATED_PAD | Freq: Once | CUTANEOUS | Status: AC
  Administered 2016-11-14: 6 via TOPICAL

## 2016-11-13 NOTE — Progress Notes (Signed)
PROGRESS NOTE    Dawn [name]  [id number] DOB: 01/15/1981 DOA: 11/11/2016 PCP: Angelica Chessman, MD    Brief Narrative:  Patient is a 47 HbSS admitted for sickle cell crisis, found to have bacteremia with 2/2 Staph Aureus, likely line infection.  Appreciate Dr Billy Fischer recommendation.   Assessment & Plan:   Principal Problem:   Sickle cell crisis (HCC) Active Problems:   Anemia of chronic disease   Pulmonary hypertension   Sickle cell crisis:  Will continue with IV Dilaudid, hypotonic fluid.  Transfuse 1 unit of PRBC. Line sepsis:  Will obtain another set of blood culture peripherally.  Change IV antibiotics to Ancef 2 grams IV Q 8.  Spoke with Dr Arnoldo Morale, will take out the [facility] port tomorrow.  Use peripheral line and suspect she will need a PICC line for IV Ancef 4-6 weeks. D/C Tamiflu and d/c droplet precaution OK.  Pulmonary HTN:  Stable.   DVT prophylaxis:hep SQ.  Code Status: FULL CODE.  Family Communication: None.  Disposition Plan: To home when ready.   Consultants:   Surgery.  ID.   Procedures:   None.   Antimicrobials: Anti-infectives    Start     Dose/Rate Route Frequency Ordered Stop   11/13/16 1400  ceFAZolin (ANCEF) IVPB 2g/100 mL premix     2 g 200 mL/hr over 30 Minutes Intravenous Every 8 hours 11/13/16 1007     11/13/16 0600  vancomycin (VANCOCIN) IVPB 1000 mg/200 mL premix  Status:  Discontinued     1,000 mg 200 mL/hr over 60 Minutes Intravenous Every 12 hours 11/12/16 1245 11/13/16 0933   11/12/16 1230  vancomycin (VANCOCIN) 1,500 mg in sodium chloride 0.9 % 500 mL IVPB     1,500 mg 250 mL/hr over 120 Minutes Intravenous  Once 11/12/16 1142 11/12/16 1506   11/12/16 1200  piperacillin-tazobactam (ZOSYN) IVPB 3.375 g  Status:  Discontinued     3.375 g 12.5 mL/hr over 240 Minutes Intravenous Every 8 hours 11/12/16 1142 11/13/16 1007   11/12/16 1000  oseltamivir (TAMIFLU) capsule 75 mg  Status:  Discontinued     75 mg Oral 2 times daily  11/12/16 0811 11/13/16 1007       Subjective:  Pain everywhere.    Objective: Vitals:   11/12/16 2153 11/13/16 0317 11/13/16 0417 11/13/16 0500  BP: 131/72   129/71  Pulse: 93   94  Resp: 18   18  Temp: 98.8 F (37.1 C) (!) 100.7 F (38.2 C) 98.7 F (37.1 C) 98.8 F (37.1 C)  TempSrc: Oral   Oral  SpO2: 96%   97%  Weight:      Height:        Intake/Output Summary (Last 24 hours) at 11/13/16 1026 Last data filed at 11/13/16 0856  Gross per 24 hour  Intake             3995 ml  Output              750 ml  Net             3245 ml   Filed Weights   11/11/16 0818 11/11/16 1533  Weight: 73.9 kg (163 lb) 73.9 kg (163 lb)    Examination:  General exam: Appears calm and comfortable  Respiratory system: Clear to auscultation. Respiratory effort normal. Cardiovascular system: S1 & S2 heard, RRR. No JVD, murmurs, rubs, gallops or clicks. No pedal edema. Gastrointestinal system: Abdomen is nondistended, soft and nontender. No organomegaly or masses felt.  Normal bowel sounds heard. Central nervous system: Alert and oriented. No focal neurological deficits. Extremities: Symmetric 5 x 5 power. Skin: No rashes, lesions or ulcers Psychiatry: Judgement and insight appear normal. Mood & affect appropriate.   Data Reviewed: I have personally reviewed following labs and imaging studies  CBC:  Recent Labs Lab 11/09/16 1032 11/11/16 0934 11/12/16 0639 11/13/16 0621  WBC 7.3 10.9* 12.1* 9.7  NEUTROABS 4.2 7.5  --  6.3  HGB 7.1* 6.5* 6.7* 6.2*  HCT 18.7* 18.2* 17.9* 16.8*  MCV 95.9 97.8 95.7 96.0  PLT 102* 114* 99* 99*   Basic Metabolic Panel:  Recent Labs Lab 11/09/16 1032 11/11/16 0934 11/12/16 0639 11/13/16 0621  NA 135 136 132* 132*  K 3.1* 3.3* 3.1* 2.8*  CL 105 106 104 104  CO2 23 23 22 23   GLUCOSE 149* 102* 125* 128*  BUN 5* 7 6 5*  CREATININE 0.48 0.47 0.47 0.38*  CALCIUM 8.2* 8.2* 7.7* 7.7*   GFR: Estimated Creatinine Clearance: 94.5 mL/min (by C-G  formula based on SCr of 0.38 mg/dL (L)). Liver Function Tests:  Recent Labs Lab 11/09/16 1032 11/11/16 0934 11/13/16 0621  AST 121* 138* 94*  ALT 38 41 31  ALKPHOS 123 103 83  BILITOT 13.8* 18.2* 14.9*  PROT 6.9 7.1 6.4*  ALBUMIN 2.3* 2.4* 2.0*   Anemia Panel:  Recent Labs  11/11/16 0934  RETICCTPCT 25.9*   Sepsis Recent Results (from the past 240 hour(s))  Culture, blood (Routine X 2) w Reflex to ID Panel     Status: Abnormal (Preliminary result)   Collection Time: 11/11/16  6:43 PM  Result Value Ref Range Status   Specimen Description BLOOD RIGHT HAND  Final   Special Requests BOTTLES DRAWN AEROBIC AND ANAEROBIC [facility] EACH  Final   Culture  Setup Time   Final    GRAM POSITIVE COCCI IN CLUSTERS Gram Stain Report Called to,Read Back By and Verified With: MAYS J. AT 1115A ON [id number] BY THOMPSON S. IN BOTH AEROBIC AND ANAEROBIC BOTTLES CRITICAL RESULT CALLED TO, READ BACK BY AND VERIFIED WITH: S. Hall Pharm.D. 16:35 11/12/16 ([email or web address]) Performed at [facility], [place] [address], [place], [place] [address]    Culture STAPHYLOCOCCUS AUREUS (A)  Final   Report Status PENDING  Incomplete  Culture, blood (Routine X 2) w Reflex to ID Panel     Status: Abnormal (Preliminary result)   Collection Time: 11/11/16  6:45 PM  Result Value Ref Range Status   Specimen Description BLOOD LEFT HAND  Final   Special Requests BOTTLES DRAWN AEROBIC AND ANAEROBIC 6CC EACH  Final   Culture  Setup Time   Final    GRAM POSITIVE COCCI IN CLUSTERS Gram Stain Report Called to,Read Back By and Verified With: MAYS J. AT 1115A ON [id number] BY THOMPSON S. IN BOTH AEROBIC AND ANAEROBIC BOTTLES Organism ID to follow CRITICAL RESULT CALLED TO, READ BACK BY AND VERIFIED WITH: S. Hall Pharm.D. 16:35 11/12/16 ([email or web address]) Performed at [facility], [place] [address], [place], [place] [address]    Culture STAPHYLOCOCCUS AUREUS (A)  Final   Report Status PENDING  Incomplete  Blood Culture ID Panel (Reflexed)      Status: Abnormal   Collection Time: 11/11/16  6:45 PM  Result Value Ref Range Status   Enterococcus species NOT DETECTED NOT DETECTED Final   Listeria monocytogenes NOT DETECTED NOT DETECTED Final   Staphylococcus species DETECTED (A) NOT DETECTED Final    Comment: CRITICAL RESULT CALLED  TO, READ BACK BY AND VERIFIED WITH: S. Hall Pharm.D. 16:35 11/12/16 ([email or web address])    Staphylococcus aureus DETECTED (A) NOT DETECTED Final    Comment: Methicillin (oxacillin) susceptible Staphylococcus aureus (MSSA). Preferred therapy is anti staphylococcal beta lactam antibiotic (Cefazolin or Nafcillin), unless clinically contraindicated. CRITICAL RESULT CALLED TO, READ BACK BY AND VERIFIED WITH: S. Hall Pharm.D. 16:35 11/12/16 ([email or web address])    Methicillin resistance NOT DETECTED NOT DETECTED Final   Streptococcus species NOT DETECTED NOT DETECTED Final   Streptococcus agalactiae NOT DETECTED NOT DETECTED Final   Streptococcus pneumoniae NOT DETECTED NOT DETECTED Final   Streptococcus pyogenes NOT DETECTED NOT DETECTED Final   Acinetobacter baumannii NOT DETECTED NOT DETECTED Final   Enterobacteriaceae species NOT DETECTED NOT DETECTED Final   Enterobacter cloacae complex NOT DETECTED NOT DETECTED Final   Escherichia coli NOT DETECTED NOT DETECTED Final   Klebsiella oxytoca NOT DETECTED NOT DETECTED Final   Klebsiella pneumoniae NOT DETECTED NOT DETECTED Final   Proteus species NOT DETECTED NOT DETECTED Final   Serratia marcescens NOT DETECTED NOT DETECTED Final   Haemophilus influenzae NOT DETECTED NOT DETECTED Final   Neisseria meningitidis NOT DETECTED NOT DETECTED Final   Pseudomonas aeruginosa NOT DETECTED NOT DETECTED Final   Candida albicans NOT DETECTED NOT DETECTED Final   Candida glabrata NOT DETECTED NOT DETECTED Final   Candida krusei NOT DETECTED NOT DETECTED Final   Candida parapsilosis NOT DETECTED NOT DETECTED Final   Candida tropicalis NOT DETECTED NOT DETECTED Final    Comment: Performed  at [facility], [address] [place], [place] [address]  Culture, blood (Routine X 2) w Reflex to ID Panel     Status: None (Preliminary result)   Collection Time: 11/12/16  8:22 AM  Result Value Ref Range Status   Specimen Description PORTA CATH DRAWN BY RN  Final   Special Requests BOTTLES DRAWN AEROBIC AND ANAEROBIC 6CC  Final   Culture  Setup Time   Final    GRAM POSITIVE COCCI Gram Stain Report Called to,Read Back By and Verified With: [place]. AT 1932 ON 11/12/16 BY EVA BOTH AEROBIC AND ANAEROBIC CRITICAL VALUE NOTED.  VALUE IS CONSISTENT WITH PREVIOUSLY REPORTED AND CALLED VALUE.    Culture   Final    TOO YOUNG TO READ Performed at [facility], [place] [address] [place], [place] [address]    Report Status PENDING  Incomplete  Culture, blood (Routine X 2) w Reflex to ID Panel     Status: None (Preliminary result)   Collection Time: 11/12/16  8:44 AM  Result Value Ref Range Status   Specimen Description PORTA CATH DRAWN BY RN  Final   Special Requests BOTTLES DRAWN AEROBIC AND ANAEROBIC 8CC  Final   Culture NO GROWTH < 24 HOURS  Final   Report Status PENDING  Incomplete     Radiology Studies: Dg Chest 2 View  Result Date: 11/11/2016 CLINICAL DATA:  Chest pain.  Sickle cell disease. EXAM: CHEST  2 VIEW COMPARISON:  Chest radiograph October 02, 2016 and chest CT October 03, 2016 FINDINGS: No edema or consolidation. Heart is mildly enlarged with pulmonary venous hypertension. No adenopathy evident. Port-A-Cath tip is in the superior vena cava. No pneumothorax. No bone lesions evident. IMPRESSION: Evidence of pulmonary vascular congestion without edema or consolidation. No evident adenopathy. Port-A-Cath tip in superior vena cava. Electronically Signed   By: Lowella Grip III M.D.   On: 11/11/2016 12:01    Scheduled Meds: . sodium chloride  Intravenous Once  . aspirin EC  81 mg Oral Daily  .  ceFAZolin (ANCEF) IV  2 g Intravenous [id number]  . folic acid  1 mg Oral  Daily  . heparin  5,000 Units Subcutaneous Q8H  . hydroxyurea  1,000 mg Oral Daily  . morphine  15 mg Oral Q12H  . multivitamin with minerals  1 tablet Oral Daily  . potassium chloride  40 mEq Oral Daily   Continuous Infusions: . 0.45 % NaCl with KCl 20 mEq / L 125 mL/hr at 11/13/16 0929     LOS: 2 days   Joliet Mallozzi, MD [facility].   If 7PM-7AM, please contact night-coverage [email or web address] Password [email or web address] 11/13/2016, 10:26 AM

## 2016-11-13 NOTE — Progress Notes (Signed)
Patient had positive blood culture, gram positive cocci. MD paged, patient already receiving Vancomycin and Zosyn.

## 2016-11-13 NOTE — Consult Note (Signed)
Reason for Consult: Infected Port-A-Cath, staph aureus Referring Physician: Dr. Marin Comment    HPI: patient is a 36 year old black female who has had multiple Port-A-Cath placed and removed for bacteremia who now presents with a recurrent episode of staph aureus bacteremia. She last had a port placed in October of last year by interventional radiology. Blood cultures are positive for staph aureus. Infectious disease has been consulted and it is recommended that the port be removed.   Past Medical History:  Diagnosis Date  . Blood transfusion   . Demand ischemia ([place]) 02/06/2016  . HCAP (healthcare-associated pneumonia) 05/19/2013  . 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 Pulmonary hypertension 02/06/2016   49 mmHg per echo on 02/06/16   . 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 Sickle cell anemia ([facility])   . Sickle cell disease ([place])   . Sickle cell disease, type S ([place])   . Vitamin B12 deficiency 07/07/2015    Past Surgical History:  Procedure Laterality Date  .  left knee ACL reconstruction    . CESAREAN SECTION     x 2  . CHOLECYSTECTOMY    . IR GENERIC HISTORICAL  07/17/2016   IR FLUORO GUIDE PORT INSERTION RIGHT 07/17/2016 Aletta Edouard, MD [facility]  . IR GENERIC HISTORICAL  07/17/2016   IR US GUIDE VASC ACCESS RIGHT 07/17/2016 Aletta Edouard, MD [facility]  . IR GENERIC HISTORICAL  10/12/2016   IR PTA VENOUS EXCEPT DIALYSIS CIRCUIT 10/12/2016 Arne Cleveland, MD [facility]  . IR GENERIC HISTORICAL  10/12/2016   IR US GUIDE VASC ACCESS RIGHT 10/12/2016 Arne Cleveland, MD [facility]  . IR GENERIC HISTORICAL  10/12/2016   IR VENO/EXT/UNI RIGHT 10/12/2016 Arne Cleveland, MD [facility]  . PORT-A-CATH REMOVAL Left 07/29/2013   Procedure: REMOVAL PORT-A-CATH;  Surgeon: Scherry Ran, MD;  Location: [facility];  Service: General;  Laterality: Left;  . PORTACATH PLACEMENT    . PORTACATH PLACEMENT Left 02/23/2013   Procedure: INSERTION PORT-A-CATH;  Surgeon: Donato Heinz, MD;   Location: [facility];  Service: General;  Laterality: Left;  . TEE WITHOUT CARDIOVERSION N/A 08/07/2013   Procedure: TRANSESOPHAGEAL ECHOCARDIOGRAM (TEE);  Surgeon: Arnoldo Lenis, MD;  Location: [facility];  Service: Cardiology;  Laterality: N/A;    Family History  Problem Relation Age of Onset  . Sickle cell trait Mother   . Sickle cell trait Father   . Hypertension Father   . Diabetes Father   . Sickle cell trait Sister   . Cancer Paternal Aunt     Breast    Social History:  reports that she has never smoked. She has never used smokeless tobacco. She reports that she does not drink alcohol or use drugs.  Allergies:  Allergies  Allergen Reactions  . Desferal [Deferoxamine] Hives    Local reaction on arm only during infusion. Can take with benadryl   . Latex Other (See Comments)    REACTION: Pt experiences a burning sensation on contacted skin areas  . Lisinopril Other (See Comments) and Cough    REACTION: Sore/scratchy throat  . Tape Other (See Comments)    REACTION: Pt. Experiences a burning sensation on contacted skin areas  . Tylenol [Acetaminophen] Other (See Comments)    Pt. Does not take the following due to protection of kidney health. Past occurrence of Protein in urine.    Medications:  Prior to Admission:  Prescriptions Prior to Admission  Medication Sig Dispense Refill Last Dose  . aspirin EC 81 MG EC tablet  Take 1 tablet (81 mg total) by mouth daily. 30 tablet 0 11/11/2016 at Unknown time  . folic acid (FOLVITE) 1 MG tablet Take 1 tablet (1 mg total) by mouth daily. 30 tablet 11 11/11/2016 at Unknown time  . hydroxyurea (HYDREA) 500 MG capsule Take 2 capsules (1,000 mg total) by mouth daily. May take with food to minimize GI side effects. 60 capsule 11 11/11/2016 at Unknown time  . levonorgestrel (MIRENA) 20 MCG/24HR IUD 1 each by Intrauterine route once.   11/11/2016 at Unknown time  . loratadine (CLARITIN) 10 MG tablet Take 10 mg by mouth daily.   11/11/2016 at  Unknown time  . morphine (MS CONTIN) 15 MG 12 hr tablet Take 1 tablet (15 mg total) by mouth every 12 (twelve) hours. 60 tablet 0 11/11/2016 at Unknown time  . Multiple Vitamin (MULTIVITAMIN WITH MINERALS) TABS tablet Take 1 tablet by mouth daily.   11/11/2016 at Unknown time  . Oxycodone HCl 10 MG TABS Take 1 tablet (10 mg total) by mouth every 4 (four) hours as needed. 90 tablet 0 11/11/2016 at Unknown time  . promethazine (PHENERGAN) 12.5 MG tablet Take 1 tablet (12.5 mg total) by mouth every 6 (six) hours as needed for nausea or vomiting. 30 tablet 0 11/11/2016 at Unknown time  . Vitamin D, Ergocalciferol, (DRISDOL) 50000 units CAPS capsule Take 1 capsule (50,000 Units total) by mouth every 7 (seven) days. Takes on Sundays. 4 capsule 11 11/11/2016 at Unknown time   Scheduled: . sodium chloride   Intravenous Once  . aspirin EC  81 mg Oral Daily  .  ceFAZolin (ANCEF) IV  2 g Intravenous K3K  . folic acid  1 mg Oral Daily  . heparin  5,000 Units Subcutaneous Q8H  . hydroxyurea  1,000 mg Oral Daily  . morphine  15 mg Oral Q12H  . multivitamin with minerals  1 tablet Oral Daily  . potassium chloride  40 mEq Oral Daily    Results for orders placed or performed during the hospital encounter of 11/11/16 (from the past 48 hour(s))  Culture, blood (Routine X 2) w Reflex to ID Panel     Status: Abnormal (Preliminary result)   Collection Time: 11/11/16  6:43 PM  Result Value Ref Range   Specimen Description BLOOD RIGHT HAND    Special Requests BOTTLES DRAWN AEROBIC AND ANAEROBIC 6CC EACH    Culture  Setup Time      GRAM POSITIVE COCCI IN CLUSTERS Gram Stain Report Called to,Read Back By and Verified With: MAYS J. AT 1115A ON [id number] BY THOMPSON S. IN BOTH AEROBIC AND ANAEROBIC BOTTLES CRITICAL RESULT CALLED TO, READ BACK BY AND VERIFIED WITH: S. Hall Pharm.D. 16:35 11/12/16 ([email or web address]) Performed at [facility], [place] [address] [place], [place] [address]    Culture STAPHYLOCOCCUS AUREUS (A)     Report Status PENDING   Culture, blood (Routine X 2) w Reflex to ID Panel     Status: Abnormal (Preliminary result)   Collection Time: 11/11/16  6:45 PM  Result Value Ref Range   Specimen Description BLOOD LEFT HAND    Special Requests BOTTLES DRAWN AEROBIC AND ANAEROBIC 6CC EACH    Culture  Setup Time      GRAM POSITIVE COCCI IN CLUSTERS Gram Stain Report Called to,Read Back By and Verified With: MAYS J. AT 1115A ON [id number] BY THOMPSON S. IN BOTH AEROBIC AND ANAEROBIC BOTTLES Organism ID to follow CRITICAL RESULT CALLED TO, READ BACK BY AND VERIFIED WITH: S.  [employer].D. 16:35 11/12/16 ([email or web address]) Performed at [facility], [place] [address] [place], [place] [address]    Culture STAPHYLOCOCCUS AUREUS (A)    Report Status PENDING   Blood Culture ID Panel (Reflexed)     Status: Abnormal   Collection Time: 11/11/16  6:45 PM  Result Value Ref Range   Enterococcus species NOT DETECTED NOT DETECTED   Listeria monocytogenes NOT DETECTED NOT DETECTED   Staphylococcus species DETECTED (A) NOT DETECTED    Comment: CRITICAL RESULT CALLED TO, READ BACK BY AND VERIFIED WITH: S. Hall Pharm.D. 16:35 11/12/16 (wilsonm)    Staphylococcus aureus DETECTED (A) NOT DETECTED    Comment: Methicillin (oxacillin) susceptible Staphylococcus aureus (MSSA). Preferred therapy is anti staphylococcal beta lactam antibiotic (Cefazolin or Nafcillin), unless clinically contraindicated. CRITICAL RESULT CALLED TO, READ BACK BY AND VERIFIED WITH: S. Hall Pharm.D. 16:35 11/12/16 (wilsonm)    Methicillin resistance NOT DETECTED NOT DETECTED   Streptococcus species NOT DETECTED NOT DETECTED   Streptococcus agalactiae NOT DETECTED NOT DETECTED   Streptococcus pneumoniae NOT DETECTED NOT DETECTED   Streptococcus pyogenes NOT DETECTED NOT DETECTED   Acinetobacter baumannii NOT DETECTED NOT DETECTED   Enterobacteriaceae species NOT DETECTED NOT DETECTED   Enterobacter cloacae complex NOT DETECTED NOT DETECTED    Escherichia coli NOT DETECTED NOT DETECTED   Klebsiella oxytoca NOT DETECTED NOT DETECTED   Klebsiella pneumoniae NOT DETECTED NOT DETECTED   Proteus species NOT DETECTED NOT DETECTED   Serratia marcescens NOT DETECTED NOT DETECTED   Haemophilus influenzae NOT DETECTED NOT DETECTED   Neisseria meningitidis NOT DETECTED NOT DETECTED   Pseudomonas aeruginosa NOT DETECTED NOT DETECTED   Candida albicans NOT DETECTED NOT DETECTED   Candida glabrata NOT DETECTED NOT DETECTED   Candida krusei NOT DETECTED NOT DETECTED   Candida parapsilosis NOT DETECTED NOT DETECTED   Candida tropicalis NOT DETECTED NOT DETECTED    Comment: Performed at [facility], [address] [place], [place] [address]  CBC     Status: Abnormal   Collection Time: 11/12/16  6:39 AM  Result Value Ref Range   WBC 12.1 (H) 4.0 - 10.5 K/uL   RBC 1.87 (L) 3.87 - 5.11 MIL/uL   Hemoglobin 6.7 (LL) 12.0 - 15.0 g/dL    Comment: DELTA CHECK NOTED CONSISTENT WITH PREVIOUS RESULT    HCT 17.9 (L) 36.0 - 46.0 %   MCV 95.7 78.0 - 100.0 fL   MCH 35.8 (H) 26.0 - 34.0 pg   MCHC 37.4 (H) 30.0 - 36.0 g/dL   RDW 19.7 (H) 11.5 - 15.5 %   Platelets 99 (L) 150 - 400 K/uL    Comment: SPECIMEN CHECKED FOR CLOTS  Basic metabolic panel     Status: Abnormal   Collection Time: 11/12/16  6:39 AM  Result Value Ref Range   Sodium 132 (L) 135 - 145 mmol/L   Potassium 3.1 (L) 3.5 - 5.1 mmol/L   Chloride 104 101 - 111 mmol/L   CO2 22 22 - 32 mmol/L   Glucose, Bld 125 (H) 65 - 99 mg/dL   BUN 6 6 - 20 mg/dL   Creatinine, Ser 0.47 0.44 - 1.00 mg/dL   Calcium 7.7 (L) 8.9 - 10.3 mg/dL   GFR calc non Af Amer >60 >60 mL/min   GFR calc Af Amer >60 >60 mL/min    Comment: (NOTE) The eGFR has been calculated using the CKD EPI equation. This calculation has not been validated in all clinical situations. eGFR's persistently <60 mL/min  signify possible Chronic Kidney Disease.    Anion gap 6 5 - 15  Culture, blood (Routine X 2) w Reflex to ID  Panel     Status: None (Preliminary result)   Collection Time: 11/12/16  8:22 AM  Result Value Ref Range   Specimen Description PORTA CATH DRAWN BY RN    Special Requests BOTTLES DRAWN AEROBIC AND ANAEROBIC 6CC    Culture  Setup Time      GRAM POSITIVE COCCI Gram Stain Report Called to,Read Back By and Verified With: Bailey. AT 1932 ON 11/12/16 BY EVA BOTH AEROBIC AND ANAEROBIC CRITICAL VALUE NOTED.  VALUE IS CONSISTENT WITH PREVIOUSLY REPORTED AND CALLED VALUE.    Culture      TOO YOUNG TO READ Performed at [facility], [place] [address] [place], [place] [address]    Report Status PENDING   Culture, blood (Routine X 2) w Reflex to ID Panel     Status: None (Preliminary result)   Collection Time: 11/12/16  8:44 AM  Result Value Ref Range   Specimen Description PORTA CATH DRAWN BY RN    Special Requests BOTTLES DRAWN AEROBIC AND ANAEROBIC 8CC    Culture  Setup Time      GRAM POSITIVE COCCI IN CLUSTERS Gram Stain Report Called to,Read Back By and Verified With: MAYS J. AT [id number] ON [id number] BY THOMPSON S.    Culture PENDING    Report Status PENDING   Influenza panel by PCR (type A & B)     Status: None   Collection Time: 11/12/16  8:47 AM  Result Value Ref Range   Influenza A By PCR NEGATIVE NEGATIVE   Influenza B By PCR NEGATIVE NEGATIVE    Comment: (NOTE) The Xpert Xpress Flu assay is intended as an aid in the diagnosis of  influenza and should not be used as a sole basis for treatment.  This  assay is FDA approved for nasopharyngeal swab specimens only. Nasal  washings and aspirates are unacceptable for Xpert Xpress Flu testing.   CBC with Differential/Platelet     Status: Abnormal   Collection Time: 11/13/16  6:21 AM  Result Value Ref Range   WBC 9.7 4.0 - 10.5 K/uL    Comment: ADJUSTED FOR NUCLEATED RBC'S WHITE COUNT CONFIRMED ON SMEAR    RBC 1.75 (L) 3.87 - 5.11 MIL/uL   Hemoglobin 6.2 (LL) 12.0 - 15.0 g/dL    Comment: RESULT REPEATED AND VERIFIED DELTA CHECK  NOTED CONSISTENT WITH PREVIOUS RESULT    HCT 16.8 (L) 36.0 - 46.0 %   MCV 96.0 78.0 - 100.0 fL   MCH 35.4 (H) 26.0 - 34.0 pg   MCHC 36.9 (H) 30.0 - 36.0 g/dL   RDW 19.7 (H) 11.5 - 15.5 %   Platelets 99 (L) 150 - 400 K/uL    Comment: SPECIMEN CHECKED FOR CLOTS CONSISTENT WITH PREVIOUS RESULT    Neutrophils Relative % 65 %   Lymphocytes Relative 21 %   Monocytes Relative 10 %   Eosinophils Relative 4 %   Basophils Relative 0 %   Neutro Abs 6.3 1.7 - 7.7 K/uL   Lymphs Abs 2.0 0.7 - 4.0 K/uL   Monocytes Absolute 1.0 0.1 - 1.0 K/uL   Eosinophils Absolute 0.4 0.0 - 0.7 K/uL   Basophils Absolute 0.0 0.0 - 0.1 K/uL   RBC Morphology STOMATOCYTES     Comment: TARGET CELLS SICKLE CELLS   Comprehensive metabolic panel     Status: Abnormal   Collection  Time: 11/13/16  6:21 AM  Result Value Ref Range   Sodium 132 (L) 135 - 145 mmol/L   Potassium 2.8 (L) 3.5 - 5.1 mmol/L   Chloride 104 101 - 111 mmol/L   CO2 23 22 - 32 mmol/L   Glucose, Bld 128 (H) 65 - 99 mg/dL   BUN 5 (L) 6 - 20 mg/dL   Creatinine, Ser 0.38 (L) 0.44 - 1.00 mg/dL   Calcium 7.7 (L) 8.9 - 10.3 mg/dL   Total Protein 6.4 (L) 6.5 - 8.1 g/dL   Albumin 2.0 (L) 3.5 - 5.0 g/dL   AST 94 (H) 15 - 41 U/L   ALT 31 14 - 54 U/L   Alkaline Phosphatase 83 38 - 126 U/L   Total Bilirubin 14.9 (H) 0.3 - 1.2 mg/dL   GFR calc non Af Amer >60 >60 mL/min   GFR calc Af Amer >60 >60 mL/min    Comment: (NOTE) The eGFR has been calculated using the CKD EPI equation. This calculation has not been validated in all clinical situations. eGFR's persistently <60 mL/min signify possible Chronic Kidney Disease.    Anion gap 5 5 - 15  Culture, blood (Routine X 2) w Reflex to ID Panel     Status: None (Preliminary result)   Collection Time: 11/13/16 11:05 AM  Result Value Ref Range   Specimen Description BLOOD LEFT HAND    Special Requests BOTTLES DRAWN AEROBIC ONLY 6CC    Culture PENDING    Report Status PENDING   Type and screen [facility]     Status: None   Collection Time: 11/13/16 11:06 AM  Result Value Ref Range   ABO/RH(D) A POS    Antibody Screen POS    Sample Expiration 11/16/2016   Prepare RBC     Status: None   Collection Time: 11/13/16 11:06 AM  Result Value Ref Range   Order Confirmation ORDER PROCESSED BY BLOOD BANK     No results found.  ROS:  Pertinent items are noted in HPI.  Blood pressure 129/71, pulse 94, temperature (!) 101.1 F (38.4 C), temperature source Oral, resp. rate 18, height 5' 3"  (1.6 m), weight 73.9 kg (163 lb), SpO2 97 %. Physical Exam: Pleasant black female who is very anxious, but in no acute distress. Lungs clear auscultation with breath sounds bilaterally. Heart examination reveals a regular rate and rhythm without S3, S4, murmurs. Head is normocephalic, atraumatic. Chest examination reveals a Port-A-Cath which is accessed in the right upper chest.  Assessment/Plan: Impression: Staph aureus bacteremia, infected Port-A-Cath Plan: Patient be taken to the operating room tomorrow for removal of the Port-A-Cath. The risks and benefits of the procedure were fully explained to the patient, who gave informed consent. As has been discussed with Dr. Marin Comment, patient can have a PICC line placed in 2 days for her 6 weeks of IV antibiotics. After that time, I would consider replacing the Port-A-Cath as needed.  Oceana Walthall A 11/13/2016, 2:13 PM

## 2016-11-13 NOTE — Progress Notes (Signed)
CRITICAL VALUE ALERT  Critical value received:  Hgb 6.2  Date of notification:  11/13/16  Time of notification:  0630  Critical value read back:Yes.    Nurse who received alert:  Deeann Dowse, RN  MD notified (1st page):  Dr. Marin Comment  Time of first page:  0727  MD notified (2nd page):  Time of second page:  Responding MD:    Time MD responded:

## 2016-11-13 NOTE — Progress Notes (Signed)
      [place] Antimicrobial Management Team Staphylococcus aureus bacteremia   Staphylococcus aureus bacteremia (SAB) is associated with a high rate of complications and mortality.  Specific aspects of clinical management are critical to optimizing the outcome of patients with SAB.  Therefore, the [facility] Antimicrobial Management Team [facility]) has initiated an intervention aimed at improving the management of SAB at [facility].  To do so, Infectious Diseases physicians are providing an evidence-based consult for the management of all patients with SAB.     Yes No Comments  Perform follow-up blood cultures (even if the patient is afebrile) to ensure clearance of bacteremia [x]  []  Please repeat blood cx today, peripheral cx.(still bacteremic on 2/18, 2/19)  Remove vascular catheter and obtain follow-up blood cultures after the removal of the catheter []  []  Please have surgery remove portacath and give 48hr line holiday, documented clearance of bacteremia  Perform echocardiography to evaluate for endocarditis (transthoracic ECHO is 40-50% sensitive, TEE is > 90% sensitive) []  []  Please get TEE  keep in mind, that neither test can definitively EXCLUDE endocarditis, and that should clinical suspicion remain high for endocarditis the patient should then still be treated with an "endocarditis" duration of therapy = 6 weeks       Ensure source control []  []  Have all abscesses been drained effectively? Have deep seeded infections (septic joints or osteomyelitis) had appropriate surgical debridement?  Investigate for "metastatic" sites of infection []  []  Does the patient have ANY symptom or physical exam finding that would suggest a deeper infection (back or neck pain that may be suggestive of vertebral osteomyelitis or epidural abscess, muscle pain that could be a symptom of pyomyositis)?  Keep in mind that for deep seeded infections MRI imaging with contrast is preferred rather than other often  insensitive tests such as plain x-rays, especially early in a patient's presentation.  Change antibiotic therapy to _________cefazolin_________ []  []  Beta-lactam antibiotics are preferred for MSSA due to higher cure rates.   If on Vancomycin, goal trough should be 15 - 20 mcg/mL  Estimated duration of IV antibiotic therapy:  4-6 wk []  []  Consult case management for probably prolonged outpatient IV antibiotic therapy

## 2016-11-13 NOTE — Progress Notes (Signed)
CRITICAL VALUE ALERT  Critical value received:  Blood Culture Positive for Gram + Cocci in Clusters  Date of notification:  11/13/16  Time of notification: 1310  Critical value read back:Yes.    Nurse who received alert:  Sharyn Blitz, RN  MD notified (1st page):  Dr. Marin Comment  Time of first page:  1313  MD notified (2nd page):  Time of second page:  Responding MD:    Time MD responded:

## 2016-11-14 ENCOUNTER — Inpatient Hospital Stay (HOSPITAL_COMMUNITY): Payer: Medicare Other | Admitting: Anesthesiology

## 2016-11-14 ENCOUNTER — Encounter (HOSPITAL_COMMUNITY)
Admission: EM | Disposition: A | Discharge status: Home / Self Care | Payer: Self-pay | Source: Home / Self Care | Attending: Family Medicine

## 2016-11-14 HISTORY — PX: PORT-A-CATH REMOVAL: SHX5289

## 2016-11-14 LAB — CBC WITH DIFFERENTIAL/PLATELET
Basophils Absolute: 0 10*3/uL (ref 0.0–0.1)
Basophils Relative: 0 %
Eosinophils Absolute: 0.5 10*3/uL (ref 0.0–0.7)
Eosinophils Relative: 5 %
HEMATOCRIT: 19.5 % — AB (ref 36.0–46.0)
HEMOGLOBIN: 7 g/dL — AB (ref 12.0–15.0)
LYMPHS ABS: 2.7 10*3/uL (ref 0.7–4.0)
LYMPHS PCT: 29 %
MCH: 34.7 pg — ABNORMAL HIGH (ref 26.0–34.0)
MCHC: 35.9 g/dL (ref 30.0–36.0)
MCV: 96.5 fL (ref 78.0–100.0)
MONOS PCT: 12 %
Monocytes Absolute: 1.1 10*3/uL — ABNORMAL HIGH (ref 0.1–1.0)
NEUTROS ABS: 5 10*3/uL (ref 1.7–7.7)
Neutrophils Relative %: 54 %
Platelets: 132 10*3/uL — ABNORMAL LOW (ref 150–400)
RBC: 2.02 MIL/uL — AB (ref 3.87–5.11)
RDW: 17.5 % — ABNORMAL HIGH (ref 11.5–15.5)
WBC: 9.3 10*3/uL (ref 4.0–10.5)

## 2016-11-14 LAB — CULTURE, BLOOD (ROUTINE X 2)

## 2016-11-14 LAB — BASIC METABOLIC PANEL
Anion gap: 5 (ref 5–15)
CHLORIDE: 105 mmol/L (ref 101–111)
CO2: 23 mmol/L (ref 22–32)
Calcium: 7.9 mg/dL — ABNORMAL LOW (ref 8.9–10.3)
Creatinine, Ser: 0.45 mg/dL (ref 0.44–1.00)
GFR calc Af Amer: >60 mL/min (ref >60)
GFR calc non Af Amer: >60 mL/min (ref >60)
Glucose, Bld: 88 mg/dL (ref 65–99)
POTASSIUM: 3.3 mmol/L — AB (ref 3.5–5.1)
Sodium: 133 mmol/L — ABNORMAL LOW (ref 135–145)

## 2016-11-14 LAB — HEPATIC FUNCTION PANEL
ALK PHOS: 92 U/L (ref 38–126)
ALT: 29 U/L (ref 14–54)
AST: 89 U/L — ABNORMAL HIGH (ref 15–41)
Albumin: 2.1 g/dL — ABNORMAL LOW (ref 3.5–5.0)
Bilirubin, Direct: 6.3 mg/dL — ABNORMAL HIGH (ref 0.1–0.5)
Indirect Bilirubin: 6.5 mg/dL — ABNORMAL HIGH (ref 0.3–0.9)
TOTAL PROTEIN: 6.5 g/dL (ref 6.5–8.1)
Total Bilirubin: 12.8 mg/dL — ABNORMAL HIGH (ref 0.3–1.2)

## 2016-11-14 SURGERY — REMOVAL PORT-A-CATH
Anesthesia: Monitor Anesthesia Care | Site: Chest | Laterality: Right

## 2016-11-14 MED ORDER — PROPOFOL 10 MG/ML IV BOLUS
INTRAVENOUS | Status: DC | PRN
  Administered 2016-11-14 (×3): 20 mg via INTRAVENOUS

## 2016-11-14 MED ORDER — 0.9 % SODIUM CHLORIDE (POUR BTL) OPTIME
TOPICAL | Status: DC | PRN
  Administered 2016-11-14: 500 mL

## 2016-11-14 MED ORDER — FENTANYL CITRATE (PF) 100 MCG/2ML IJ SOLN
INTRAMUSCULAR | Status: AC
  Filled 2016-11-14: qty 2

## 2016-11-14 MED ORDER — PROPOFOL 500 MG/50ML IV EMUL
INTRAVENOUS | Status: DC | PRN
  Administered 2016-11-14: 75 ug/kg/min via INTRAVENOUS

## 2016-11-14 MED ORDER — LACTATED RINGERS IV SOLN
INTRAVENOUS | Status: DC
  Administered 2016-11-14: 1000 mL via INTRAVENOUS

## 2016-11-14 MED ORDER — FENTANYL CITRATE (PF) 100 MCG/2ML IJ SOLN
INTRAMUSCULAR | Status: DC | PRN
  Administered 2016-11-14 (×4): 25 ug via INTRAVENOUS

## 2016-11-14 MED ORDER — LIDOCAINE HCL (PF) 1 % IJ SOLN
INTRAMUSCULAR | Status: DC | PRN
  Administered 2016-11-14: 7 mL

## 2016-11-14 MED ORDER — MIDAZOLAM HCL 2 MG/2ML IJ SOLN
INTRAMUSCULAR | Status: AC
  Filled 2016-11-14: qty 2

## 2016-11-14 MED ORDER — MIDAZOLAM HCL 2 MG/2ML IJ SOLN
1.0000 mg | INTRAMUSCULAR | Status: AC
  Administered 2016-11-14 (×2): 2 mg via INTRAVENOUS
  Filled 2016-11-14: qty 2

## 2016-11-14 MED ORDER — FENTANYL CITRATE (PF) 100 MCG/2ML IJ SOLN
25.0000 ug | INTRAMUSCULAR | Status: AC
  Administered 2016-11-14 (×2): 25 ug via INTRAVENOUS

## 2016-11-14 MED ORDER — FENTANYL CITRATE (PF) 100 MCG/2ML IJ SOLN
25.0000 ug | INTRAMUSCULAR | Status: DC | PRN
  Administered 2016-11-14: 50 ug via INTRAVENOUS

## 2016-11-14 MED ORDER — PROPOFOL 10 MG/ML IV BOLUS
INTRAVENOUS | Status: AC
  Filled 2016-11-14: qty 40

## 2016-11-14 MED ORDER — LIDOCAINE HCL (PF) 1 % IJ SOLN
INTRAMUSCULAR | Status: AC
  Filled 2016-11-14: qty 30

## 2016-11-14 SURGICAL SUPPLY — 17 items
BAG HAMPER (MISCELLANEOUS) ×3 IMPLANT
CHLORAPREP W/TINT 26ML (MISCELLANEOUS) ×3 IMPLANT
CLOTH BEACON ORANGE TIMEOUT ST (SAFETY) ×3 IMPLANT
DECANTER SPIKE VIAL GLASS SM (MISCELLANEOUS) ×3 IMPLANT
DERMABOND ADVANCED (GAUZE/BANDAGES/DRESSINGS) ×2
DERMABOND ADVANCED .7 DNX12 (GAUZE/BANDAGES/DRESSINGS) ×1 IMPLANT
DRAPE PROXIMA HALF (DRAPES) ×3 IMPLANT
ELECT REM PT RETURN 9FT ADLT (ELECTROSURGICAL) ×3
ELECTRODE REM PT RTRN 9FT ADLT (ELECTROSURGICAL) ×1 IMPLANT
GLOVE BIOGEL PI IND STRL 7.0 (GLOVE) ×1 IMPLANT
GLOVE BIOGEL PI INDICATOR 7.0 (GLOVE) ×2
GLOVE SURG SS PI 7.5 STRL IVOR (GLOVE) ×3 IMPLANT
GOWN STRL REUS W/TWL LRG LVL3 (GOWN DISPOSABLE) ×3 IMPLANT
SUT VIC AB 3-0 SH 27 (SUTURE) ×2
SUT VIC AB 3-0 SH 27X BRD (SUTURE) ×1 IMPLANT
SUT VIC AB 4-0 PS2 27 (SUTURE) ×3 IMPLANT
SWAB CULTURE LIQ STUART DBL (MISCELLANEOUS) ×3 IMPLANT

## 2016-11-14 NOTE — Interval H&P Note (Signed)
History and Physical Interval Note:  11/14/2016 8:04 AM  Dawn [name]  has presented today for surgery, with the diagnosis of infected portacath, sickle cell disease  The various methods of treatment have been discussed with the patient and family. After consideration of risks, benefits and other options for treatment, the patient has consented to  Procedure(s): REMOVAL PORT-A-CATH (Right) as a surgical intervention .  The patient's history has been reviewed, patient examined, no change in status, stable for surgery.  I have reviewed the patient's chart and labs.  Questions were answered to the patient's satisfaction.     Aviva Signs A

## 2016-11-14 NOTE — Anesthesia Postprocedure Evaluation (Signed)
Anesthesia Post Note  Patient: Dawn [name]  Procedure(s) Performed: Procedure(s) (LRB): REMOVAL PORT-A-CATH (Right)  Patient location during evaluation: PACU Anesthesia Type: MAC Level of consciousness: awake and alert Pain management: satisfactory to patient Vital Signs Assessment: post-procedure vital signs reviewed and stable Respiratory status: spontaneous breathing and patient connected to nasal cannula oxygen Cardiovascular status: stable Anesthetic complications: no     Last Vitals:  Vitals:   11/14/16 0816 11/14/16 0830  BP: 127/84 135/80  Pulse:    Resp: (!) 27 (!) 24  Temp: 36.6 C     Last Pain:  Vitals:   11/14/16 0816  TempSrc:   PainSc: 6                  Laquetta Racey

## 2016-11-14 NOTE — Anesthesia Preprocedure Evaluation (Signed)
Anesthesia Evaluation  Patient identified by MRN, date of birth, ID band Patient awake    Reviewed: Allergy & Precautions, H&P , NPO status , Patient's Chart, lab work & pertinent test results  Airway Mallampati: II  TM Distance: >3 FB     Dental  (+) Teeth Intact   Pulmonary neg pulmonary ROS, Pneumonia: resolved 04-2013.,    breath sounds clear to auscultation       Cardiovascular (-) hypertension (PIH) Rhythm:Regular Rate:Normal     Neuro/Psych PSYCHIATRIC DISORDERS Depression    GI/Hepatic neg GERD  ,  Endo/Other    Renal/GU      Musculoskeletal   Abdominal   Peds  Hematology  (+) Blood dyscrasia, Sickle cell anemia and anemia ,   Anesthesia Other Findings Transfused 1 PC last night. .  Reproductive/Obstetrics                             Anesthesia Physical Anesthesia Plan  ASA: III  Anesthesia Plan: MAC   Post-op Pain Management:    Induction: Intravenous  Airway Management Planned: Simple Face Mask  Additional Equipment:   Intra-op Plan:   Post-operative Plan:   Informed Consent: I have reviewed the patients History and Physical, chart, labs and discussed the procedure including the risks, benefits and alternatives for the proposed anesthesia with the patient or authorized representative who has indicated his/her understanding and acceptance.     Plan Discussed with:   Anesthesia Plan Comments:         Anesthesia Quick Evaluation

## 2016-11-14 NOTE — Progress Notes (Signed)
PROGRESS NOTE    Dawn [name]  [id number] DOB: 1981/06/28 DOA: 11/11/2016 PCP: Angelica Chessman, MD    Brief Narrative:  Patient is a 35 HbSS admitted for sickle cell crisis, found to have bacteremia with 2/2 Staph Aureus (MSSA), likely line infection.  Appreciate Dr Billy Fischer recommendation. She had her port removed this morning.  She was transfused 1 unit of PRBC bringing her Hb from 6.2 g to 7 g this morning.  Her total bili improved to 12 from 18 as well.   She is now on Ancef 2g IV q 8.  She will need to have a TEE at some point.    Assessment & Plan:   Principal Problem:   Sickle cell crisis [facility]) Active Problems:   Anemia of chronic disease   Pulmonary hypertension  Line sepsis:  Port removed Feb 21/18.  Can place PICC line on Friday as she will likely need 4 weeks of IV antibiotics.  Dr Arnoldo Morale can place another port later.  Will request TEE.  Sickle cell crisis:  One unit pRBC given.  Continue with IVF (hypotonic, please), and continue with IV dilaudid.  Hypokalemia:  Being supplemented.   DVT prophylaxis:  Sub Q heparin.  Code Status:  FULL CODE.  Family Communication: none.  Disposition Plan: Home when appropriate.   Consultants:   ID  Surgery.  Cardiology.   Procedures:   Removal of Hickman's  Antimicrobials: Anti-infectives    Start     Dose/Rate Route Frequency Ordered Stop   11/13/16 1400  ceFAZolin (ANCEF) IVPB 2g/100 mL premix     2 g 200 mL/hr over 30 Minutes Intravenous Every 8 hours 11/13/16 1007     11/13/16 0600  vancomycin (VANCOCIN) IVPB 1000 mg/200 mL premix  Status:  Discontinued     1,000 mg 200 mL/hr over 60 Minutes Intravenous Every 12 hours 11/12/16 1245 11/13/16 0933   11/12/16 1230  vancomycin (VANCOCIN) 1,500 mg in sodium chloride 0.9 % 500 mL IVPB     1,500 mg 250 mL/hr over 120 Minutes Intravenous  Once 11/12/16 1142 11/12/16 1506   11/12/16 1200  piperacillin-tazobactam (ZOSYN) IVPB 3.375 g  Status:  Discontinued     3.375 g 12.5 mL/hr over 240 Minutes Intravenous Every 8 hours 11/12/16 1142 11/13/16 1007   11/12/16 1000  oseltamivir (TAMIFLU) capsule 75 mg  Status:  Discontinued     75 mg Oral 2 times daily 11/12/16 0811 11/13/16 1007       Subjective: doing better.     Objective: Vitals:   11/14/16 0830 11/14/16 0916 11/14/16 0945 11/14/16 0959  BP: 135/80 127/78 139/86 (P) 140/81  Pulse:  86 90 (P) 84  Resp: (!) 24 (!) 23 (!) 26 (P) 20  Temp:  97.8 F (36.6 C)  (P) 98.7 F (37.1 C)  TempSrc:    (P) Oral  SpO2:  99% 100%   Weight:      Height:        Intake/Output Summary (Last 24 hours) at 11/14/16 1302 Last data filed at 11/14/16 0955  Gross per 24 hour  Intake          3613.25 ml  Output              505 ml  Net          3108.25 ml   Filed Weights   11/11/16 0818 11/11/16 1533  Weight: 73.9 kg (163 lb) 73.9 kg (163 lb)    Examination:  General exam: Appears calm  and comfortable  Respiratory system: Clear to auscultation. Respiratory effort normal. Cardiovascular system: S1 & S2 heard, RRR. No JVD, murmurs, rubs, gallops or clicks. No pedal edema. Gastrointestinal system: Abdomen is nondistended, soft and nontender. No organomegaly or masses felt. Normal bowel sounds heard. Central nervous system: Alert and oriented. No focal neurological deficits. Extremities: Symmetric 5 x 5 power. Skin: No rashes, lesions or ulcers Psychiatry: Judgement and insight appear normal. Mood & affect appropriate.   Data Reviewed: I have personally reviewed following labs and imaging studies  CBC:  Recent Labs Lab 11/09/16 1032 11/11/16 0934 11/12/16 0639 11/13/16 0621 11/14/16 0643  WBC 7.3 10.9* 12.1* 9.7 9.3  NEUTROABS 4.2 7.5  --  6.3 5.0  HGB 7.1* 6.5* 6.7* 6.2* 7.0*  HCT 18.7* 18.2* 17.9* 16.8* 19.5*  MCV 95.9 97.8 95.7 96.0 96.5  PLT 102* 114* 99* 99* Q000111Q*   Basic Metabolic Panel:  Recent Labs Lab 11/09/16 1032 11/11/16 0934 11/12/16 0639 11/13/16 0621 11/14/16 0643   NA 135 136 132* 132* 133*  K 3.1* 3.3* 3.1* 2.8* 3.3*  CL 105 106 104 104 105  CO2 23 23 22 23 23   GLUCOSE 149* 102* 125* 128* 88  BUN 5* 7 6 5* <5*  CREATININE 0.48 0.47 0.47 0.38* 0.45  CALCIUM 8.2* 8.2* 7.7* 7.7* 7.9*   GFR: Estimated Creatinine Clearance: 94.5 mL/min (by C-G formula based on SCr of 0.45 mg/dL). Liver Function Tests:  Recent Labs Lab 11/09/16 1032 11/11/16 0934 11/13/16 0621 11/14/16 0643  AST 121* 138* 94* 89*  ALT 38 41 31 29  ALKPHOS 123 103 83 92  BILITOT 13.8* 18.2* 14.9* 12.8*  PROT 6.9 7.1 6.4* 6.5  ALBUMIN 2.3* 2.4* 2.0* 2.1*    Recent Results (from the past 240 hour(s))  Culture, blood (Routine X 2) w Reflex to ID Panel     Status: Abnormal   Collection Time: 11/11/16  6:43 PM  Result Value Ref Range Status   Specimen Description BLOOD RIGHT HAND  Final   Special Requests BOTTLES DRAWN AEROBIC AND ANAEROBIC [facility] EACH  Final   Culture  Setup Time   Final    GRAM POSITIVE COCCI IN CLUSTERS Gram Stain Report Called to,Read Back By and Verified With: MAYS J. AT 1115A ON [id number] BY THOMPSON S. IN BOTH AEROBIC AND ANAEROBIC BOTTLES CRITICAL RESULT CALLED TO, READ BACK BY AND VERIFIED WITH: S. Hall Pharm.D. 16:35 11/12/16 ([email or web address])    Culture (A)  Final    STAPHYLOCOCCUS AUREUS SUSCEPTIBILITIES PERFORMED ON PREVIOUS CULTURE WITHIN THE LAST 5 DAYS. Performed at [facility], [place] [address] [place], [place] [address]    Report Status 11/14/2016 FINAL  Final  Culture, blood (Routine X 2) w Reflex to ID Panel     Status: Abnormal   Collection Time: 11/11/16  6:45 PM  Result Value Ref Range Status   Specimen Description BLOOD LEFT HAND  Final   Special Requests BOTTLES DRAWN AEROBIC AND ANAEROBIC [facility] EACH  Final   Culture  Setup Time   Final    GRAM POSITIVE COCCI IN CLUSTERS Gram Stain Report Called to,Read Back By and Verified With: MAYS J. AT 1115A ON [id number] BY THOMPSON S. IN BOTH AEROBIC AND ANAEROBIC BOTTLES Organism ID to  follow CRITICAL RESULT CALLED TO, READ BACK BY AND VERIFIED WITH: S. Hall Pharm.D. 16:35 11/12/16 ([email or web address]) Performed at [facility], [place] [address], [place], [place] [address]    Culture STAPHYLOCOCCUS AUREUS (A)  Final   Report  Status 11/14/2016 FINAL  Final   Organism ID, Bacteria STAPHYLOCOCCUS AUREUS  Final      Susceptibility   Staphylococcus aureus - MIC*    CIPROFLOXACIN <=0.5 SENSITIVE Sensitive     ERYTHROMYCIN <=0.25 SENSITIVE Sensitive     GENTAMICIN <=0.5 SENSITIVE Sensitive     OXACILLIN <=0.25 SENSITIVE Sensitive     TETRACYCLINE <=1 SENSITIVE Sensitive     VANCOMYCIN 1 SENSITIVE Sensitive     TRIMETH/SULFA <=10 SENSITIVE Sensitive     CLINDAMYCIN <=0.25 SENSITIVE Sensitive     RIFAMPIN <=0.5 SENSITIVE Sensitive     Inducible Clindamycin NEGATIVE Sensitive     * STAPHYLOCOCCUS AUREUS  Blood Culture ID Panel (Reflexed)     Status: Abnormal   Collection Time: 11/11/16  6:45 PM  Result Value Ref Range Status   Enterococcus species NOT DETECTED NOT DETECTED Final   Listeria monocytogenes NOT DETECTED NOT DETECTED Final   Staphylococcus species DETECTED (A) NOT DETECTED Final    Comment: CRITICAL RESULT CALLED TO, READ BACK BY AND VERIFIED WITH: S. Hall Pharm.D. 16:35 11/12/16 ([email or web address])    Staphylococcus aureus DETECTED (A) NOT DETECTED Final    Comment: Methicillin (oxacillin) susceptible Staphylococcus aureus (MSSA). Preferred therapy is anti staphylococcal beta lactam antibiotic (Cefazolin or Nafcillin), unless clinically contraindicated. CRITICAL RESULT CALLED TO, READ BACK BY AND VERIFIED WITH: S. Hall Pharm.D. 16:35 11/12/16 (wilsonm)    Methicillin resistance NOT DETECTED NOT DETECTED Final   Streptococcus species NOT DETECTED NOT DETECTED Final   Streptococcus agalactiae NOT DETECTED NOT DETECTED Final   Streptococcus pneumoniae NOT DETECTED NOT DETECTED Final   Streptococcus pyogenes NOT DETECTED NOT DETECTED Final   Acinetobacter baumannii NOT DETECTED  NOT DETECTED Final   Enterobacteriaceae species NOT DETECTED NOT DETECTED Final   Enterobacter cloacae complex NOT DETECTED NOT DETECTED Final   Escherichia coli NOT DETECTED NOT DETECTED Final   Klebsiella oxytoca NOT DETECTED NOT DETECTED Final   Klebsiella pneumoniae NOT DETECTED NOT DETECTED Final   Proteus species NOT DETECTED NOT DETECTED Final   Serratia marcescens NOT DETECTED NOT DETECTED Final   Haemophilus influenzae NOT DETECTED NOT DETECTED Final   Neisseria meningitidis NOT DETECTED NOT DETECTED Final   Pseudomonas aeruginosa NOT DETECTED NOT DETECTED Final   Candida albicans NOT DETECTED NOT DETECTED Final   Candida glabrata NOT DETECTED NOT DETECTED Final   Candida krusei NOT DETECTED NOT DETECTED Final   Candida parapsilosis NOT DETECTED NOT DETECTED Final   Candida tropicalis NOT DETECTED NOT DETECTED Final    Comment: Performed at [facility], [address], [place], [place] [address]  Culture, blood (Routine X 2) w Reflex to ID Panel     Status: Abnormal   Collection Time: 11/12/16  8:22 AM  Result Value Ref Range Status   Specimen Description PORTA CATH DRAWN BY RN  Final   Special Requests BOTTLES DRAWN AEROBIC AND ANAEROBIC 6CC  Final   Culture  Setup Time   Final    GRAM POSITIVE COCCI Gram Stain Report Called to,Read Back By and Verified With: [place]. AT 1932 ON 11/12/16 BY EVA IN BOTH AEROBIC AND ANAEROBIC BOTTLES CRITICAL VALUE NOTED.  VALUE IS CONSISTENT WITH PREVIOUSLY REPORTED AND CALLED VALUE.    Culture (A)  Final    STAPHYLOCOCCUS AUREUS SUSCEPTIBILITIES PERFORMED ON PREVIOUS CULTURE WITHIN THE LAST 5 DAYS. Performed at [facility], [place] [address] [place], [place] [address]    Report Status 11/14/2016 FINAL  Final  Culture, blood (Routine X 2) w  Reflex to ID Panel     Status: Abnormal (Preliminary result)   Collection Time: 11/12/16  8:44 AM  Result Value Ref Range Status   Specimen Description PORTA CATH DRAWN BY RN  Final    Special Requests BOTTLES DRAWN AEROBIC AND ANAEROBIC 8CC  Final   Culture  Setup Time   Final    GRAM POSITIVE COCCI IN CLUSTERS Gram Stain Report Called to,Read Back By and Verified With: MAYS J. AT 1309 ON [id number] BY THOMPSON S.    Culture (A)  Final    STAPHYLOCOCCUS AUREUS SUSCEPTIBILITIES PERFORMED ON PREVIOUS CULTURE WITHIN THE LAST 5 DAYS. Performed at [facility], [place] [address] [place], [place] [address]    Report Status PENDING  Incomplete  Culture, blood (Routine X 2) w Reflex to ID Panel     Status: None (Preliminary result)   Collection Time: 11/13/16 11:05 AM  Result Value Ref Range Status   Specimen Description BLOOD LEFT HAND  Final   Special Requests BOTTLES DRAWN AEROBIC ONLY 6CC  Final   Culture NO GROWTH < 24 HOURS  Final   Report Status PENDING  Incomplete  Surgical pcr screen     Status: None   Collection Time: 11/13/16  7:38 PM  Result Value Ref Range Status   MRSA, PCR NEGATIVE NEGATIVE Final   Staphylococcus aureus NEGATIVE NEGATIVE Final    Comment:        The Xpert SA Assay (FDA approved for NASAL specimens in patients over 74 years of age), is one component of a comprehensive surveillance program.  Test performance has been validated by [facility] for patients greater than or equal to 38 year old. It is not intended to diagnose infection nor to guide or monitor treatment.      Radiology Studies: No results found.  Scheduled Meds: . sodium chloride   Intravenous Once  . aspirin EC  81 mg Oral Daily  .  ceFAZolin (ANCEF) IV  2 g Intravenous Q000111Q  . folic acid  1 mg Oral Daily  . heparin  5,000 Units Subcutaneous Q8H  . hydroxyurea  1,000 mg Oral Daily  . morphine  15 mg Oral Q12H  . multivitamin with minerals  1 tablet Oral Daily  . potassium chloride  40 mEq Oral Daily   Continuous Infusions: . 0.45 % NaCl with KCl 20 mEq / L 125 mL/hr at 11/14/16 0421     LOS: 3 days   Raine Elsass, MD [facility].   If 7PM-7AM,  please contact night-coverage [email or web address] Password [email or web address] 11/14/2016, 1:02 PM

## 2016-11-14 NOTE — Op Note (Signed)
Patient:  Dawn [name]  DOB:  [birth year]  MRN:  [id number]   Preop Diagnosis:  Infected Port-A-Cath  Postop Diagnosis:  Same  Procedure:  Removal of Port-A-Cath  Surgeon:  Aviva Signs, M.D.  Anes:  Mac  Indications:  Patient is a 36 year old black female with sickle cell anemia who now has positive blood cultures for staph aureus. Patient has been referred for removal of her Port-A-Cath. The risks and benefits of the procedure were fully explained to the patient, who gave informed consent.  Procedure note:  Patient was placed in the supine position. After monitored anesthesia care was given, the right upper chest was prepped and draped using usual sterile technique with DuraPrep. Surgical site confirmation was performed. One percent Xylocaine was used for local anesthesia.  An incision was made through the previous incision site for placement of her Port-A-Cath. The dissection was taken down to the port. The Port-A-Cath was removed in total without difficulty. The catheter tip was sent for culture. The catheter tunnel was oversewn using a 3-0 Vicryl suture. The wound was irrigated normal saline. No purulent fluid was noted within the pocket. The skin was closed using a 4-0 Vicryl subcuticular suture. Dermabond was applied.  All tape and needle counts were correct at the end of the procedure. The patient was awakened and transferred to PACU in stable condition.  Complications:  None  EBL:  Minimal  Specimen:  Catheter tip for culture

## 2016-11-14 NOTE — Transfer of Care (Signed)
Immediate Anesthesia Transfer of Care Note  Patient: Dawn [name]  Procedure(s) Performed: Procedure(s): REMOVAL PORT-A-CATH (Right)  Patient Location: PACU  Anesthesia Type:MAC  Level of Consciousness: awake and alert   Airway & Oxygen Therapy: Patient Spontanous Breathing  Post-op Assessment: Report given to RN and Post -op Vital signs reviewed and stable  Post vital signs: Reviewed and stable  Last Vitals:  Vitals:   11/14/16 0805 11/14/16 0810  BP: 135/84 133/84  Pulse:    Resp: (!) 27 (!) 32  Temp:      Last Pain:  Vitals:   11/14/16 0730  TempSrc:   PainSc: 8       Patients Stated Pain Goal: 4 (27/03/50 0938)  Complications: No apparent anesthesia complications

## 2016-11-14 NOTE — Anesthesia Procedure Notes (Signed)
Procedure Name: MAC Date/Time: 11/14/2016 8:35 AM Performed by: Vista Deck Pre-anesthesia Checklist: Patient identified, Emergency Drugs available, Suction available, Timeout performed and Patient being monitored Patient Re-evaluated:Patient Re-evaluated prior to inductionOxygen Delivery Method: Non-rebreather mask

## 2016-11-14 NOTE — H&P (View-Only) (Signed)
Reason for Consult: Infected Port-A-Cath, staph aureus Referring Physician: Dr. Marin Comment    HPI: patient is a 36 year old black female who has had multiple Port-A-Cath placed and removed for bacteremia who now presents with a recurrent episode of staph aureus bacteremia. She last had a port placed in October of last year by interventional radiology. Blood cultures are positive for staph aureus. Infectious disease has been consulted and it is recommended that the port be removed.   Past Medical History:  Diagnosis Date  . Blood transfusion   . Demand ischemia ([place]) 02/06/2016  . HCAP (healthcare-associated pneumonia) 05/19/2013  . 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 Pulmonary hypertension 02/06/2016   49 mmHg per echo on 02/06/16   . 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 Sickle cell anemia (H[facility])   . Sickle cell disease ([place])   . Sickle cell disease, type S ([place])   . Vitamin B12 deficiency 07/07/2015    Past Surgical History:  Procedure Laterality Date  .  left knee ACL reconstruction    . CESAREAN SECTION     x 2  . CHOLECYSTECTOMY    . IR GENERIC HISTORICAL  07/17/2016   IR FLUORO GUIDE PORT INSERTION RIGHT 07/17/2016 Aletta Edouard, MD [facility]  . IR GENERIC HISTORICAL  07/17/2016   IR US GUIDE VASC ACCESS RIGHT 07/17/2016 Aletta Edouard, MD [facility]  . IR GENERIC HISTORICAL  10/12/2016   IR PTA VENOUS EXCEPT DIALYSIS CIRCUIT 10/12/2016 Arne Cleveland, MD [facility]  . IR GENERIC HISTORICAL  10/12/2016   IR US GUIDE VASC ACCESS RIGHT 10/12/2016 Arne Cleveland, MD [facility]  . IR GENERIC HISTORICAL  10/12/2016   IR VENO/EXT/UNI RIGHT 10/12/2016 Arne Cleveland, MD [facility]  . PORT-A-CATH REMOVAL Left 07/29/2013   Procedure: REMOVAL PORT-A-CATH;  Surgeon: Scherry Ran, MD;  Location: [facility];  Service: General;  Laterality: Left;  . PORTACATH PLACEMENT    . PORTACATH PLACEMENT Left 02/23/2013   Procedure: INSERTION PORT-A-CATH;  Surgeon: Donato Heinz, MD;   Location: [facility];  Service: General;  Laterality: Left;  . TEE WITHOUT CARDIOVERSION N/A 08/07/2013   Procedure: TRANSESOPHAGEAL ECHOCARDIOGRAM (TEE);  Surgeon: Arnoldo Lenis, MD;  Location: [facility];  Service: Cardiology;  Laterality: N/A;    Family History  Problem Relation Age of Onset  . Sickle cell trait Mother   . Sickle cell trait Father   . Hypertension Father   . Diabetes Father   . Sickle cell trait Sister   . Cancer Paternal Aunt     Breast    Social History:  reports that she has never smoked. She has never used smokeless tobacco. She reports that she does not drink alcohol or use drugs.  Allergies:  Allergies  Allergen Reactions  . Desferal [Deferoxamine] Hives    Local reaction on arm only during infusion. Can take with benadryl   . Latex Other (See Comments)    REACTION: Pt experiences a burning sensation on contacted skin areas  . Lisinopril Other (See Comments) and Cough    REACTION: Sore/scratchy throat  . Tape Other (See Comments)    REACTION: Pt. Experiences a burning sensation on contacted skin areas  . Tylenol [Acetaminophen] Other (See Comments)    Pt. Does not take the following due to protection of kidney health. Past occurrence of Protein in urine.    Medications:  Prior to Admission:  Prescriptions Prior to Admission  Medication Sig Dispense Refill Last Dose  . aspirin EC 81 MG EC tablet  Take 1 tablet (81 mg total) by mouth daily. 30 tablet 0 11/11/2016 at Unknown time  . folic acid (FOLVITE) 1 MG tablet Take 1 tablet (1 mg total) by mouth daily. 30 tablet 11 11/11/2016 at Unknown time  . hydroxyurea (HYDREA) 500 MG capsule Take 2 capsules (1,000 mg total) by mouth daily. May take with food to minimize GI side effects. 60 capsule 11 11/11/2016 at Unknown time  . levonorgestrel (MIRENA) 20 MCG/24HR IUD 1 each by Intrauterine route once.   11/11/2016 at Unknown time  . loratadine (CLARITIN) 10 MG tablet Take 10 mg by mouth daily.   11/11/2016 at  Unknown time  . morphine (MS CONTIN) 15 MG 12 hr tablet Take 1 tablet (15 mg total) by mouth every 12 (twelve) hours. 60 tablet 0 11/11/2016 at Unknown time  . Multiple Vitamin (MULTIVITAMIN WITH MINERALS) TABS tablet Take 1 tablet by mouth daily.   11/11/2016 at Unknown time  . Oxycodone HCl 10 MG TABS Take 1 tablet (10 mg total) by mouth every 4 (four) hours as needed. 90 tablet 0 11/11/2016 at Unknown time  . promethazine (PHENERGAN) 12.5 MG tablet Take 1 tablet (12.5 mg total) by mouth every 6 (six) hours as needed for nausea or vomiting. 30 tablet 0 11/11/2016 at Unknown time  . Vitamin D, Ergocalciferol, (DRISDOL) 50000 units CAPS capsule Take 1 capsule (50,000 Units total) by mouth every 7 (seven) days. Takes on Sundays. 4 capsule 11 11/11/2016 at Unknown time   Scheduled: . sodium chloride   Intravenous Once  . aspirin EC  81 mg Oral Daily  .  ceFAZolin (ANCEF) IV  2 g Intravenous F5P  . folic acid  1 mg Oral Daily  . heparin  5,000 Units Subcutaneous Q8H  . hydroxyurea  1,000 mg Oral Daily  . morphine  15 mg Oral Q12H  . multivitamin with minerals  1 tablet Oral Daily  . potassium chloride  40 mEq Oral Daily    Results for orders placed or performed during the hospital encounter of 11/11/16 (from the past 48 hour(s))  Culture, blood (Routine X 2) w Reflex to ID Panel     Status: Abnormal (Preliminary result)   Collection Time: 11/11/16  6:43 PM  Result Value Ref Range   Specimen Description BLOOD RIGHT HAND    Special Requests BOTTLES DRAWN AEROBIC AND ANAEROBIC 6CC EACH    Culture  Setup Time      GRAM POSITIVE COCCI IN CLUSTERS Gram Stain Report Called to,Read Back By and Verified With: MAYS J. AT 1115A ON [id number] BY THOMPSON S. IN BOTH AEROBIC AND ANAEROBIC BOTTLES CRITICAL RESULT CALLED TO, READ BACK BY AND VERIFIED WITH: S. Hall Pharm.D. 16:35 11/12/16 (wilsonm) Performed at [facility], [place] [address], [place], [place] [address]    Culture STAPHYLOCOCCUS AUREUS (A)     Report Status PENDING   Culture, blood (Routine X 2) w Reflex to ID Panel     Status: Abnormal (Preliminary result)   Collection Time: 11/11/16  6:45 PM  Result Value Ref Range   Specimen Description BLOOD LEFT HAND    Special Requests BOTTLES DRAWN AEROBIC AND ANAEROBIC 6CC EACH    Culture  Setup Time      GRAM POSITIVE COCCI IN CLUSTERS Gram Stain Report Called to,Read Back By and Verified With: MAYS J. AT 1115A ON [id number] BY THOMPSON S. IN BOTH AEROBIC AND ANAEROBIC BOTTLES Organism ID to follow CRITICAL RESULT CALLED TO, READ BACK BY AND VERIFIED WITH: S.  [employer].D. 16:35 11/12/16 (wilsonm) Performed at [facility], [place] [address], [place], [place] [address]    Culture STAPHYLOCOCCUS AUREUS (A)    Report Status PENDING   Blood Culture ID Panel (Reflexed)     Status: Abnormal   Collection Time: 11/11/16  6:45 PM  Result Value Ref Range   Enterococcus species NOT DETECTED NOT DETECTED   Listeria monocytogenes NOT DETECTED NOT DETECTED   Staphylococcus species DETECTED (A) NOT DETECTED    Comment: CRITICAL RESULT CALLED TO, READ BACK BY AND VERIFIED WITH: S. Hall Pharm.D. 16:35 11/12/16 (wilsonm)    Staphylococcus aureus DETECTED (A) NOT DETECTED    Comment: Methicillin (oxacillin) susceptible Staphylococcus aureus (MSSA). Preferred therapy is anti staphylococcal beta lactam antibiotic (Cefazolin or Nafcillin), unless clinically contraindicated. CRITICAL RESULT CALLED TO, READ BACK BY AND VERIFIED WITH: S. Hall Pharm.D. 16:35 11/12/16 (wilsonm)    Methicillin resistance NOT DETECTED NOT DETECTED   Streptococcus species NOT DETECTED NOT DETECTED   Streptococcus agalactiae NOT DETECTED NOT DETECTED   Streptococcus pneumoniae NOT DETECTED NOT DETECTED   Streptococcus pyogenes NOT DETECTED NOT DETECTED   Acinetobacter baumannii NOT DETECTED NOT DETECTED   Enterobacteriaceae species NOT DETECTED NOT DETECTED   Enterobacter cloacae complex NOT DETECTED NOT DETECTED    Escherichia coli NOT DETECTED NOT DETECTED   Klebsiella oxytoca NOT DETECTED NOT DETECTED   Klebsiella pneumoniae NOT DETECTED NOT DETECTED   Proteus species NOT DETECTED NOT DETECTED   Serratia marcescens NOT DETECTED NOT DETECTED   Haemophilus influenzae NOT DETECTED NOT DETECTED   Neisseria meningitidis NOT DETECTED NOT DETECTED   Pseudomonas aeruginosa NOT DETECTED NOT DETECTED   Candida albicans NOT DETECTED NOT DETECTED   Candida glabrata NOT DETECTED NOT DETECTED   Candida krusei NOT DETECTED NOT DETECTED   Candida parapsilosis NOT DETECTED NOT DETECTED   Candida tropicalis NOT DETECTED NOT DETECTED    Comment: Performed at [facility], [address], [place], [place] [address]  CBC     Status: Abnormal   Collection Time: 11/12/16  6:39 AM  Result Value Ref Range   WBC 12.1 (H) 4.0 - 10.5 K/uL   RBC 1.87 (L) 3.87 - 5.11 MIL/uL   Hemoglobin 6.7 (LL) 12.0 - 15.0 g/dL    Comment: DELTA CHECK NOTED CONSISTENT WITH PREVIOUS RESULT    HCT 17.9 (L) 36.0 - 46.0 %   MCV 95.7 78.0 - 100.0 fL   MCH 35.8 (H) 26.0 - 34.0 pg   MCHC 37.4 (H) 30.0 - 36.0 g/dL   RDW 19.7 (H) 11.5 - 15.5 %   Platelets 99 (L) 150 - 400 K/uL    Comment: SPECIMEN CHECKED FOR CLOTS  Basic metabolic panel     Status: Abnormal   Collection Time: 11/12/16  6:39 AM  Result Value Ref Range   Sodium 132 (L) 135 - 145 mmol/L   Potassium 3.1 (L) 3.5 - 5.1 mmol/L   Chloride 104 101 - 111 mmol/L   CO2 22 22 - 32 mmol/L   Glucose, Bld 125 (H) 65 - 99 mg/dL   BUN 6 6 - 20 mg/dL   Creatinine, Ser 0.47 0.44 - 1.00 mg/dL   Calcium 7.7 (L) 8.9 - 10.3 mg/dL   GFR calc non Af Amer >60 >60 mL/min   GFR calc Af Amer >60 >60 mL/min    Comment: (NOTE) The eGFR has been calculated using the CKD EPI equation. This calculation has not been validated in all clinical situations. eGFR's persistently <60 mL/min  signify possible Chronic Kidney Disease.    Anion gap 6 5 - 15  Culture, blood (Routine X 2) w Reflex to ID  Panel     Status: None (Preliminary result)   Collection Time: 11/12/16  8:22 AM  Result Value Ref Range   Specimen Description PORTA CATH DRAWN BY RN    Special Requests BOTTLES DRAWN AEROBIC AND ANAEROBIC 6CC    Culture  Setup Time      GRAM POSITIVE COCCI Gram Stain Report Called to,Read Back By and Verified With: [place]. AT 1932 ON 11/12/16 BY EVA BOTH AEROBIC AND ANAEROBIC CRITICAL VALUE NOTED.  VALUE IS CONSISTENT WITH PREVIOUSLY REPORTED AND CALLED VALUE.    Culture      TOO YOUNG TO READ Performed at [facility], [place] [address], [place],  [address]    Report Status PENDING   Culture, blood (Routine X 2) w Reflex to ID Panel     Status: None (Preliminary result)   Collection Time: 11/12/16  8:44 AM  Result Value Ref Range   Specimen Description PORTA CATH DRAWN BY RN    Special Requests BOTTLES DRAWN AEROBIC AND ANAEROBIC 8CC    Culture  Setup Time      GRAM POSITIVE COCCI IN CLUSTERS Gram Stain Report Called to,Read Back By and Verified With: MAYS J. AT 9643 ON [id number] BY THOMPSON S.    Culture PENDING    Report Status PENDING   Influenza panel by PCR (type A & B)     Status: None   Collection Time: 11/12/16  8:47 AM  Result Value Ref Range   Influenza A By PCR NEGATIVE NEGATIVE   Influenza B By PCR NEGATIVE NEGATIVE    Comment: (NOTE) The Xpert Xpress Flu assay is intended as an aid in the diagnosis of  influenza and should not be used as a sole basis for treatment.  This  assay is FDA approved for nasopharyngeal swab specimens only. Nasal  washings and aspirates are unacceptable for Xpert Xpress Flu testing.   CBC with Differential/Platelet     Status: Abnormal   Collection Time: 11/13/16  6:21 AM  Result Value Ref Range   WBC 9.7 4.0 - 10.5 K/uL    Comment: ADJUSTED FOR NUCLEATED RBC'S WHITE COUNT CONFIRMED ON SMEAR    RBC 1.75 (L) 3.87 - 5.11 MIL/uL   Hemoglobin 6.2 (LL) 12.0 - 15.0 g/dL    Comment: RESULT REPEATED AND VERIFIED DELTA CHECK  NOTED CONSISTENT WITH PREVIOUS RESULT    HCT 16.8 (L) 36.0 - 46.0 %   MCV 96.0 78.0 - 100.0 fL   MCH 35.4 (H) 26.0 - 34.0 pg   MCHC 36.9 (H) 30.0 - 36.0 g/dL   RDW 19.7 (H) 11.5 - 15.5 %   Platelets 99 (L) 150 - 400 K/uL    Comment: SPECIMEN CHECKED FOR CLOTS CONSISTENT WITH PREVIOUS RESULT    Neutrophils Relative % 65 %   Lymphocytes Relative 21 %   Monocytes Relative 10 %   Eosinophils Relative 4 %   Basophils Relative 0 %   Neutro Abs 6.3 1.7 - 7.7 K/uL   Lymphs Abs 2.0 0.7 - 4.0 K/uL   Monocytes Absolute 1.0 0.1 - 1.0 K/uL   Eosinophils Absolute 0.4 0.0 - 0.7 K/uL   Basophils Absolute 0.0 0.0 - 0.1 K/uL   RBC Morphology STOMATOCYTES     Comment: TARGET CELLS SICKLE CELLS   Comprehensive metabolic panel     Status: Abnormal   Collection  Time: 11/13/16  6:21 AM  Result Value Ref Range   Sodium 132 (L) 135 - 145 mmol/L   Potassium 2.8 (L) 3.5 - 5.1 mmol/L   Chloride 104 101 - 111 mmol/L   CO2 23 22 - 32 mmol/L   Glucose, Bld 128 (H) 65 - 99 mg/dL   BUN 5 (L) 6 - 20 mg/dL   Creatinine, Ser 0.38 (L) 0.44 - 1.00 mg/dL   Calcium 7.7 (L) 8.9 - 10.3 mg/dL   Total Protein 6.4 (L) 6.5 - 8.1 g/dL   Albumin 2.0 (L) 3.5 - 5.0 g/dL   AST 94 (H) 15 - 41 U/L   ALT 31 14 - 54 U/L   Alkaline Phosphatase 83 38 - 126 U/L   Total Bilirubin 14.9 (H) 0.3 - 1.2 mg/dL   GFR calc non Af Amer >60 >60 mL/min   GFR calc Af Amer >60 >60 mL/min    Comment: (NOTE) The eGFR has been calculated using the CKD EPI equation. This calculation has not been validated in all clinical situations. eGFR's persistently <60 mL/min signify possible Chronic Kidney Disease.    Anion gap 5 5 - 15  Culture, blood (Routine X 2) w Reflex to ID Panel     Status: None (Preliminary result)   Collection Time: 11/13/16 11:05 AM  Result Value Ref Range   Specimen Description BLOOD LEFT HAND    Special Requests BOTTLES DRAWN AEROBIC ONLY 6CC    Culture PENDING    Report Status PENDING   Type and screen [facility]     Status: None   Collection Time: 11/13/16 11:06 AM  Result Value Ref Range   ABO/RH(D) A POS    Antibody Screen POS    Sample Expiration 11/16/2016   Prepare RBC     Status: None   Collection Time: 11/13/16 11:06 AM  Result Value Ref Range   Order Confirmation ORDER PROCESSED BY BLOOD BANK     No results found.  ROS:  Pertinent items are noted in HPI.  Blood pressure 129/71, pulse 94, temperature (!) 101.1 F (38.4 C), temperature source Oral, resp. rate 18, height 5' 3"  (1.6 m), weight 73.9 kg (163 lb), SpO2 97 %. Physical Exam: Pleasant black female who is very anxious, but in no acute distress. Lungs clear auscultation with breath sounds bilaterally. Heart examination reveals a regular rate and rhythm without S3, S4, murmurs. Head is normocephalic, atraumatic. Chest examination reveals a Port-A-Cath which is accessed in the right upper chest.  Assessment/Plan: Impression: Staph aureus bacteremia, infected Port-A-Cath Plan: Patient be taken to the operating room tomorrow for removal of the Port-A-Cath. The risks and benefits of the procedure were fully explained to the patient, who gave informed consent. As has been discussed with Dr. Marin Comment, patient can have a PICC line placed in 2 days for her 6 weeks of IV antibiotics. After that time, I would consider replacing the Port-A-Cath as needed.  Deem Marmol A 11/13/2016, 2:13 PM

## 2016-11-15 ENCOUNTER — Encounter (HOSPITAL_COMMUNITY): Payer: Self-pay | Admitting: General Surgery

## 2016-11-15 DIAGNOSIS — R7881 Bacteremia: Secondary | ICD-10-CM

## 2016-11-15 DIAGNOSIS — D638 Anemia in other chronic diseases classified elsewhere: Secondary | ICD-10-CM

## 2016-11-15 DIAGNOSIS — I272 Pulmonary hypertension, unspecified: Secondary | ICD-10-CM

## 2016-11-15 DIAGNOSIS — D5700 Hb-SS disease with crisis, unspecified: Secondary | ICD-10-CM

## 2016-11-15 LAB — CULTURE, BLOOD (ROUTINE X 2)

## 2016-11-15 NOTE — Anesthesia Postprocedure Evaluation (Signed)
Anesthesia Post Note  Patient: Dawn [name]  Procedure(s) Performed: Procedure(s) (LRB): REMOVAL PORT-A-CATH (Right)  Patient location during evaluation: Nursing Unit Anesthesia Type: MAC Level of consciousness: awake and alert, oriented and patient cooperative Pain management: pain level controlled Vital Signs Assessment: post-procedure vital signs reviewed and stable Respiratory status: spontaneous breathing, nonlabored ventilation and respiratory function stable Cardiovascular status: blood pressure returned to baseline Postop Assessment: no signs of nausea or vomiting Anesthetic complications: no     Last Vitals:  Vitals:   11/15/16 0532 11/15/16 1427  BP: (!) 121/58 128/70  Pulse: 83 91  Resp: 18 18  Temp: 36.8 C 36.8 C    Last Pain:  Vitals:   11/15/16 1427  TempSrc: Oral  PainSc:                  [name] J

## 2016-11-15 NOTE — Addendum Note (Signed)
Addendum  created 11/15/16 1621 by Charmaine Downs, CRNA   Sign clinical note

## 2016-11-15 NOTE — Progress Notes (Signed)
1 Day Post-Op  Subjective: Patient has no complaints of pain at incision site.  Objective: Vital signs in last 24 hours: Temp:  [97.8 F (36.6 C)-98.7 F (37.1 C)] 98.2 F (36.8 C) (02/22 0532) Pulse Rate:  [83-91] 83 (02/22 0532) Resp:  [18-26] 18 (02/22 0532) BP: (113-140)/(58-86) 121/58 (02/22 0532) SpO2:  [97 %-100 %] 97 % (02/22 0532) Last BM Date: 11/14/16  Intake/Output from previous day: 02/21 0701 - 02/22 0700 In: 3435.3 [P.O.:702; I.V.:2733.3] Out: 1105 [Urine:1100; Blood:5] Intake/Output this shift: No intake/output data recorded.  General appearance: alert, cooperative and no distress Chest wall: Incision site healing well. No erythema noted.  Lab Results:   Recent Labs  11/13/16 0621 11/14/16 0643  WBC 9.7 9.3  HGB 6.2* 7.0*  HCT 16.8* 19.5*  PLT 99* 132*   BMET  Recent Labs  11/13/16 0621 11/14/16 0643  NA 132* 133*  K 2.8* 3.3*  CL 104 105  CO2 23 23  GLUCOSE 128* 88  BUN 5* <5*  CREATININE 0.38* 0.45  CALCIUM 7.7* 7.9*   PT/INR No results for input(s): LABPROT, INR in the last 72 hours.  Studies/Results: No results found.  Anti-infectives: Anti-infectives    Start     Dose/Rate Route Frequency Ordered Stop   11/13/16 1400  ceFAZolin (ANCEF) IVPB 2g/100 mL premix     2 g 200 mL/hr over 30 Minutes Intravenous Every 8 hours 11/13/16 1007     11/13/16 0600  vancomycin (VANCOCIN) IVPB 1000 mg/200 mL premix  Status:  Discontinued     1,000 mg 200 mL/hr over 60 Minutes Intravenous Every 12 hours 11/12/16 1245 11/13/16 0933   11/12/16 1230  vancomycin (VANCOCIN) 1,500 mg in sodium chloride 0.9 % 500 mL IVPB     1,500 mg 250 mL/hr over 120 Minutes Intravenous  Once 11/12/16 1142 11/12/16 1506   11/12/16 1200  piperacillin-tazobactam (ZOSYN) IVPB 3.375 g  Status:  Discontinued     3.375 g 12.5 mL/hr over 240 Minutes Intravenous Every 8 hours 11/12/16 1142 11/13/16 1007   11/12/16 1000  oseltamivir (TAMIFLU) capsule 75 mg  Status:   Discontinued     75 mg Oral 2 times daily 11/12/16 0811 11/13/16 1007      Assessment/Plan: s/p Procedure(s): REMOVAL PORT-A-CATH Impression: Stable status post removal Port-A-Cath. Will sign off.  LOS: 4 days    Fradel Baldonado A 11/15/2016

## 2016-11-15 NOTE — Progress Notes (Signed)
PROGRESS NOTE    Dawn [name]  [id number] DOB: 03/16/1981 DOA: 11/11/2016 PCP: Angelica Chessman, MD    Brief Narrative:  Patient is a 35 HbSS admitted for sickle cell crisis, found to have bacteremia with 2/2 Staph Aureus (MSSA), likely line infection. Appreciate Dr Storm Frisk recommendation. She had her port removed on 2/21.  She was transfused 1 unit of PRBC bringing her Hb from 6.2 g to 7 g on 2/21; her total bili improved to 12 from 18 as well.   She is now on Ancef 2g IV q 8.  She will need to have an echocardiogram at some point.   Assessment & Plan:   Principal Problem:   Sickle cell crisis (HCC) Active Problems:   Anemia of chronic disease   Pulmonary hypertension   Line sepsis - Port removed Feb 21/18 - Will order PICC placement tomorrow - Will need 4 weeks of IV antibiotics - Cardiology saw patient and discussed getting TTE - will need repeat TTE at end of antibiotic course  Sickle cell crisis - One unit pRBC given - Continue with IVF  - Continue with IV dilaudid. - Patient reports improvement in her pain - repeat CBC and CMP in am  Hypokalemia - K supplementation in patient's IVF   DVT prophylaxis: SubQ heparin Code Status: Full code Family Communication: no family bedside Disposition Plan: will plan for TTE and placement of PICC line then setting patient up for 4 weeks of outpt IV antibiotics   Consultants:   Surgery  Cardiology  ID (via telephone)  Procedures:   Echocardiogram  Antimicrobials:   Ancef    Subjective: Patient reports her arm and leg pain are well controlled with current pain medication.  She denies any new symptoms and denies having any questions.  She feels well informed on treatment plan.  Objective: Vitals:   11/14/16 0959 11/14/16 1300 11/14/16 2051 11/15/16 0532  BP: 140/81 113/69 130/74 (!) 121/58  Pulse: 84 86 91 83  Resp: 20 20 20 18   Temp: 98.7 F (37.1 C) 98 F (36.7 C) 98.5 F (36.9 C) 98.2 F (36.8  C)  TempSrc: Oral Oral Oral Oral  SpO2:  100% 97% 97%  Weight:      Height:        Intake/Output Summary (Last 24 hours) at 11/15/16 1145 Last data filed at 11/15/16 0259  Gross per 24 hour  Intake          2613.33 ml  Output              600 ml  Net          2013.33 ml   Filed Weights   11/11/16 0818 11/11/16 1533  Weight: 73.9 kg (163 lb) 73.9 kg (163 lb)    Examination:  General exam: Appears calm and comfortable  Respiratory system: Clear to auscultation. Respiratory effort normal. Cardiovascular system: S1 & S2 heard, RRR. No JVD, murmurs, rubs, gallops or clicks. No pedal edema. Gastrointestinal system: Abdomen is nondistended, soft and nontender. No organomegaly or masses felt. Normal bowel sounds heard. Central nervous system: Alert and oriented. No focal neurological deficits. Extremities: Symmetric 5 x 5 power. Skin: No rashes, lesions or ulcers Psychiatry: Judgement and insight appear normal. Mood & affect appropriate.     Data Reviewed: I have personally reviewed following labs and imaging studies  CBC:  Recent Labs Lab 11/09/16 1032 11/11/16 0934 11/12/16 0639 11/13/16 0621 11/14/16 0643  WBC 7.3 10.9* 12.1* 9.7 9.3  NEUTROABS 4.2  7.5  --  6.3 5.0  HGB 7.1* 6.5* 6.7* 6.2* 7.0*  HCT 18.7* 18.2* 17.9* 16.8* 19.5*  MCV 95.9 97.8 95.7 96.0 96.5  PLT 102* 114* 99* 99* [id number]*   Basic Metabolic Panel:  Recent Labs Lab 11/09/16 1032 11/11/16 0934 11/12/16 0639 11/13/16 0621 11/14/16 0643  NA 135 136 132* 132* 133*  K 3.1* 3.3* 3.1* 2.8* 3.3*  CL 105 106 104 104 105  CO2 23 23 22 23 23   GLUCOSE 149* 102* 125* 128* 88  BUN 5* 7 6 5* <5*  CREATININE 0.48 0.47 0.47 0.38* 0.45  CALCIUM 8.2* 8.2* 7.7* 7.7* 7.9*   GFR: Estimated Creatinine Clearance: 94.5 mL/min (by C-G formula based on SCr of 0.45 mg/dL). Liver Function Tests:  Recent Labs Lab 11/09/16 1032 11/11/16 0934 11/13/16 0621 11/14/16 0643  AST 121* 138* 94* 89*  ALT 38 41 31 29    ALKPHOS 123 103 83 92  BILITOT 13.8* 18.2* 14.9* 12.8*  PROT 6.9 7.1 6.4* 6.5  ALBUMIN 2.3* 2.4* 2.0* 2.1*   No results for input(s): LIPASE, AMYLASE in the last 168 hours. No results for input(s): AMMONIA in the last 168 hours. Coagulation Profile: No results for input(s): INR, PROTIME in the last 168 hours. Cardiac Enzymes: No results for input(s): CKTOTAL, CKMB, CKMBINDEX, TROPONINI in the last 168 hours. BNP (last 3 results) No results for input(s): PROBNP in the last 8760 hours. HbA1C: No results for input(s): HGBA1C in the last 72 hours. CBG: No results for input(s): GLUCAP in the last 168 hours. Lipid Profile: No results for input(s): CHOL, HDL, LDLCALC, TRIG, CHOLHDL, LDLDIRECT in the last 72 hours. Thyroid Function Tests: No results for input(s): TSH, T4TOTAL, FREET4, T3FREE, THYROIDAB in the last 72 hours. Anemia Panel: No results for input(s): VITAMINB12, FOLATE, FERRITIN, TIBC, IRON, RETICCTPCT in the last 72 hours. Sepsis Labs: No results for input(s): PROCALCITON, LATICACIDVEN in the last 168 hours.  Recent Results (from the past 240 hour(s))  Culture, blood (Routine X 2) w Reflex to ID Panel     Status: Abnormal   Collection Time: 11/11/16  6:43 PM  Result Value Ref Range Status   Specimen Description BLOOD RIGHT HAND  Final   Special Requests BOTTLES DRAWN AEROBIC AND ANAEROBIC [facility] EACH  Final   Culture  Setup Time   Final    GRAM POSITIVE COCCI IN CLUSTERS Gram Stain Report Called to,Read Back By and Verified With: MAYS J. AT 1115A ON [id number] BY THOMPSON S. IN BOTH AEROBIC AND ANAEROBIC BOTTLES CRITICAL RESULT CALLED TO, READ BACK BY AND VERIFIED WITH: S. Hall Pharm.D. 16:35 11/12/16 ([email or web address])    Culture (A)  Final    STAPHYLOCOCCUS AUREUS SUSCEPTIBILITIES PERFORMED ON PREVIOUS CULTURE WITHIN THE LAST 5 DAYS. Performed at [facility], [place] [address], [place], [place] [address]    Report Status 11/14/2016 FINAL  Final  Culture, blood (Routine X 2) w  Reflex to ID Panel     Status: Abnormal   Collection Time: 11/11/16  6:45 PM  Result Value Ref Range Status   Specimen Description BLOOD LEFT HAND  Final   Special Requests BOTTLES DRAWN AEROBIC AND ANAEROBIC [facility] EACH  Final   Culture  Setup Time   Final    GRAM POSITIVE COCCI IN CLUSTERS Gram Stain Report Called to,Read Back By and Verified With: MAYS J. AT 1115A ON [id number] BY THOMPSON S. IN BOTH AEROBIC AND ANAEROBIC BOTTLES Organism ID to follow CRITICAL RESULT CALLED TO, READ BACK BY  AND VERIFIED WITH: S. [place] Crane Pharm.D. 16:35 11/12/16 ([email or web address]) Performed at [facility], [place] [address], [place], [place] [address]    Culture STAPHYLOCOCCUS AUREUS (A)  Final   Report Status 11/14/2016 FINAL  Final   Organism ID, Bacteria STAPHYLOCOCCUS AUREUS  Final      Susceptibility   Staphylococcus aureus - MIC*    CIPROFLOXACIN <=0.5 SENSITIVE Sensitive     ERYTHROMYCIN <=0.25 SENSITIVE Sensitive     GENTAMICIN <=0.5 SENSITIVE Sensitive     OXACILLIN <=0.25 SENSITIVE Sensitive     TETRACYCLINE <=1 SENSITIVE Sensitive     VANCOMYCIN 1 SENSITIVE Sensitive     TRIMETH/SULFA <=10 SENSITIVE Sensitive     CLINDAMYCIN <=0.25 SENSITIVE Sensitive     RIFAMPIN <=0.5 SENSITIVE Sensitive     Inducible Clindamycin NEGATIVE Sensitive     * STAPHYLOCOCCUS AUREUS  Blood Culture ID Panel (Reflexed)     Status: Abnormal   Collection Time: 11/11/16  6:45 PM  Result Value Ref Range Status   Enterococcus species NOT DETECTED NOT DETECTED Final   Listeria monocytogenes NOT DETECTED NOT DETECTED Final   Staphylococcus species DETECTED (A) NOT DETECTED Final    Comment: CRITICAL RESULT CALLED TO, READ BACK BY AND VERIFIED WITH: S. Hall Pharm.D. 16:35 11/12/16 ([email or web address])    Staphylococcus aureus DETECTED (A) NOT DETECTED Final    Comment: Methicillin (oxacillin) susceptible Staphylococcus aureus (MSSA). Preferred therapy is anti staphylococcal beta lactam antibiotic (Cefazolin or Nafcillin), unless clinically  contraindicated. CRITICAL RESULT CALLED TO, READ BACK BY AND VERIFIED WITH: S. Hall Pharm.D. 16:35 11/12/16 ([email or web address])    Methicillin resistance NOT DETECTED NOT DETECTED Final   Streptococcus species NOT DETECTED NOT DETECTED Final   Streptococcus agalactiae NOT DETECTED NOT DETECTED Final   Streptococcus pneumoniae NOT DETECTED NOT DETECTED Final   Streptococcus pyogenes NOT DETECTED NOT DETECTED Final   Acinetobacter baumannii NOT DETECTED NOT DETECTED Final   Enterobacteriaceae species NOT DETECTED NOT DETECTED Final   Enterobacter cloacae complex NOT DETECTED NOT DETECTED Final   Escherichia coli NOT DETECTED NOT DETECTED Final   Klebsiella oxytoca NOT DETECTED NOT DETECTED Final   Klebsiella pneumoniae NOT DETECTED NOT DETECTED Final   Proteus species NOT DETECTED NOT DETECTED Final   Serratia marcescens NOT DETECTED NOT DETECTED Final   Haemophilus influenzae NOT DETECTED NOT DETECTED Final   Neisseria meningitidis NOT DETECTED NOT DETECTED Final   Pseudomonas aeruginosa NOT DETECTED NOT DETECTED Final   Candida albicans NOT DETECTED NOT DETECTED Final   Candida glabrata NOT DETECTED NOT DETECTED Final   Candida krusei NOT DETECTED NOT DETECTED Final   Candida parapsilosis NOT DETECTED NOT DETECTED Final   Candida tropicalis NOT DETECTED NOT DETECTED Final    Comment: Performed at [facility], [address], [place], [place] [address]  Culture, blood (Routine X 2) w Reflex to ID Panel     Status: Abnormal   Collection Time: 11/12/16  8:22 AM  Result Value Ref Range Status   Specimen Description PORTA CATH DRAWN BY RN  Final   Special Requests BOTTLES DRAWN AEROBIC AND ANAEROBIC 6CC  Final   Culture  Setup Time   Final    GRAM POSITIVE COCCI Gram Stain Report Called to,Read Back By and Verified With: [place]. AT 1932 ON 11/12/16 BY EVA IN BOTH AEROBIC AND ANAEROBIC BOTTLES CRITICAL VALUE NOTED.  VALUE IS CONSISTENT WITH PREVIOUSLY REPORTED AND CALLED VALUE.    Culture  (A)  Final    STAPHYLOCOCCUS AUREUS SUSCEPTIBILITIES PERFORMED ON PREVIOUS  CULTURE WITHIN THE LAST 5 DAYS. Performed at [facility], [place] [address], [place], [place] [address]    Report Status 11/14/2016 FINAL  Final  Culture, blood (Routine X 2) w Reflex to ID Panel     Status: Abnormal   Collection Time: 11/12/16  8:44 AM  Result Value Ref Range Status   Specimen Description PORTA CATH DRAWN BY RN  Final   Special Requests BOTTLES DRAWN AEROBIC AND ANAEROBIC 8CC  Final   Culture  Setup Time   Final    GRAM POSITIVE COCCI IN CLUSTERS Gram Stain Report Called to,Read Back By and Verified With: MAYS J. AT 1309 ON [id number] BY THOMPSON S.    Culture (A)  Final    STAPHYLOCOCCUS AUREUS SUSCEPTIBILITIES PERFORMED ON PREVIOUS CULTURE WITHIN THE LAST 5 DAYS. Performed at [facility], [place] [address], [place],  [address]    Report Status 11/15/2016 FINAL  Final  Culture, blood (Routine X 2) w Reflex to ID Panel     Status: None (Preliminary result)   Collection Time: 11/13/16 11:05 AM  Result Value Ref Range Status   Specimen Description BLOOD LEFT HAND  Final   Special Requests BOTTLES DRAWN AEROBIC ONLY 6CC  Final   Culture NO GROWTH < 24 HOURS  Final   Report Status PENDING  Incomplete  Surgical pcr screen     Status: None   Collection Time: 11/13/16  7:38 PM  Result Value Ref Range Status   MRSA, PCR NEGATIVE NEGATIVE Final   Staphylococcus aureus NEGATIVE NEGATIVE Final    Comment:        The Xpert SA Assay (FDA approved for NASAL specimens in patients over 71 years of age), is one component of a comprehensive surveillance program.  Test performance has been validated by [facility] for patients greater than or equal to 69 year old. It is not intended to diagnose infection nor to guide or monitor treatment.          Radiology Studies: No results found.      Scheduled Meds: . sodium chloride   Intravenous Once  . aspirin EC  81 mg Oral  Daily  .  ceFAZolin (ANCEF) IV  2 g Intravenous [id number]  . folic acid  1 mg Oral Daily  . heparin  5,000 Units Subcutaneous Q8H  . hydroxyurea  1,000 mg Oral Daily  . morphine  15 mg Oral Q12H  . multivitamin with minerals  1 tablet Oral Daily  . potassium chloride  40 mEq Oral Daily   Continuous Infusions: . 0.45 % NaCl with KCl 20 mEq / L 125 mL/hr at 11/15/16 1122     LOS: 4 days    Time spent: 30 minutes    Loretha Stapler, MD [facility] Pager [phone]  If 7PM-7AM, please contact night-coverage [email or web address] Password [facility] 11/15/2016, 11:45 AM

## 2016-11-15 NOTE — Care Management Note (Signed)
Case Management Note  Patient Details  Name: Dawn [name] MRN: [id number] Date of Birth: 1981/03/22  Subjective/Objective:    Patient adm with [facility], had port a cath removed 11/14/2016. Will likely need IV antibiotic after DC. Patient would like to use [facility]. Romualdo Bolk of [facility] notified.                Action/Plan: CM will follow for needs and follow up with [facility] when patient is ready for discharge.   Expected Discharge Date:       11/17/2016           Expected Discharge Plan:  [place]  In-House Referral:  NA  Discharge planning Services  CM Consult  Post Acute Care Choice:  Home Health Choice offered to:  Patient  DME Arranged:    DME Agency:     HH Arranged:  RN, IV Antibiotics HH Agency:  [place]  Status of Service:  In process, will continue to follow  If discussed at Long Length of Stay Meetings, dates discussed:    Additional Comments:  Adamarys Shall, Chauncey Reading, RN 11/15/2016, 11:45 AM

## 2016-11-15 NOTE — Consult Note (Signed)
CARDIOLOGY CONSULT NOTE  Patient ID: ANZLEE SCHREITER MRN: [id number] DOB/AGE: 36-Nov-1982 36 y.o.  Admit date: 11/11/2016 Primary Physician: Angelica Chessman, MD Referring Physician:   Reason for Consultation: MRSA bacteremia  HPI: 36 yr old woman admitted with sickle cell crisis and found to have MRSA bacteremia likely due to line infection. Line removed 11/14/16.Was markedly anemic with Hgb 6.2, up to 7 (2/21, none for today) s/p transfusion. Hypokalemic with K 2.8 initially, now 3.3. Currently being treated with IV Ancef with 4-6 week course planned.  Echocardiogram 10/17/16:  - Left ventricle: The cavity size was normal. Wall thickness was   increased in a pattern of mild LVH. Systolic function was   vigorous. The estimated ejection fraction was in the range of 65%   to 70%. Wall motion was normal; there were no regional wall   motion abnormalities. The study is not technically sufficient to   allow evaluation of LV diastolic function. - Mitral valve: Mildly thickened leaflets . There was trivial   regurgitation. - Left atrium: Severely dilated. - Right atrium: The atrium was mildly dilated. - Tricuspid valve: There was mild regurgitation. - Pulmonary arteries: PA peak pressure: 28 mm Hg (S). - Inferior vena cava: The vessel was normal in size. The   respirophasic diameter changes were in the normal range (>= 50%),   consistent with normal central venous pressure.  Impressions:  - Compared to prior study in 2017, there are no significant   changes.  ECG 11/11/16: Sinus rhythm with possible LVH.  Hemodynamically stable, BP 121/58, HR 80's.   Denies chest pain and shortness of breath. Only complaints are arm and leg pain which are improving.     Allergies  Allergen Reactions  . Desferal [Deferoxamine] Hives    Local reaction on arm only during infusion. Can take with benadryl   . Latex Other (See Comments)    REACTION: Pt experiences a burning sensation on  contacted skin areas  . Lisinopril Other (See Comments) and Cough    REACTION: Sore/scratchy throat  . Tape Other (See Comments)    REACTION: Pt. Experiences a burning sensation on contacted skin areas  . Tylenol [Acetaminophen] Other (See Comments)    Pt. Does not take the following due to protection of kidney health. Past occurrence of Protein in urine.    Current Facility-Administered Medications  Medication Dose Route Frequency Provider Last Rate Last Dose  . 0.45 % NaCl with KCl 20 mEq / L infusion   Intravenous Continuous [name], MD 125 mL/hr at 11/15/16 0259    . 0.9 %  sodium chloride infusion   Intravenous Once [name], MD      . acetaminophen (TYLENOL) tablet 650 mg  650 mg Oral Q4H PRN [name], MD   650 mg at 11/13/16 1342  . aspirin EC tablet 81 mg  81 mg Oral Daily [name], MD   81 mg at 11/15/16 1011  . ceFAZolin (ANCEF) IVPB 2g/100 mL premix  2 g Intravenous Q8H [name], MD   2 g at 11/15/16 0556  . folic acid (FOLVITE) tablet 1 mg  1 mg Oral Daily [name], MD   1 mg at 11/15/16 1011  . heparin injection 5,000 Units  5,000 Units Subcutaneous Q8H [name], MD   5,000 Units at 11/14/16 2256  . HYDROmorphone (DILAUDID) injection 2 mg  2 mg Intravenous Q2H PRN [name], MD   2 mg at 11/15/16 1009  . hydroxyurea (  HYDREA) capsule 1,000 mg  1,000 mg Oral Daily [name], MD   1,000 mg at 11/15/16 1011  . ketorolac (TORADOL) 30 MG/ML injection 30 mg  30 mg Intravenous Q6H PRN [name], MD      . morphine (MS CONTIN) 12 hr tablet 15 mg  15 mg Oral Q12H [name], MD   15 mg at 11/15/16 1011  . multivitamin with minerals tablet 1 tablet  1 tablet Oral Daily [name], MD   1 tablet at 11/15/16 1011  . ondansetron (ZOFRAN) injection 4 mg  4 mg Intravenous Q4H PRN Fransico [place], PA-C      . potassium chloride SA (K-DUR,KLOR-CON) CR tablet 40 mEq  40 mEq Oral Daily [name], MD   40 mEq at 11/15/16 1011  . promethazine (PHENERGAN) tablet 12.5 mg  12.5 mg Oral Q6H PRN [name], MD    12.5 mg at 11/13/16 [id number]    Past Medical History:  Diagnosis Date  . Blood transfusion   . Demand ischemia ([place]) 02/06/2016  . HCAP (healthcare-associated pneumonia) 05/19/2013  . 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 Pulmonary hypertension 02/06/2016   49 mmHg per echo on 02/06/16   . 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 Sickle cell anemia (HCC)   . Sickle cell disease ([place])   . Sickle cell disease, type S ([place])   . Vitamin B12 deficiency 07/07/2015    Past Surgical History:  Procedure Laterality Date  .  left knee ACL reconstruction    . CESAREAN SECTION     x 2  . CHOLECYSTECTOMY    . IR GENERIC HISTORICAL  07/17/2016   IR FLUORO GUIDE PORT INSERTION RIGHT 07/17/2016 Aletta Edouard, MD [facility]  . IR GENERIC HISTORICAL  07/17/2016   IR US GUIDE VASC ACCESS RIGHT 07/17/2016 Aletta Edouard, MD [facility]  . IR GENERIC HISTORICAL  10/12/2016   IR PTA VENOUS EXCEPT DIALYSIS CIRCUIT 10/12/2016 Arne Cleveland, MD [facility]  . IR GENERIC HISTORICAL  10/12/2016   IR US GUIDE VASC ACCESS RIGHT 10/12/2016 Arne Cleveland, MD [facility]  . IR GENERIC HISTORICAL  10/12/2016   IR VENO/EXT/UNI RIGHT 10/12/2016 Arne Cleveland, MD [facility]  . PORT-A-CATH REMOVAL Left 07/29/2013   Procedure: REMOVAL PORT-A-CATH;  Surgeon: Scherry Ran, MD;  Location: [facility];  Service: General;  Laterality: Left;  . PORTACATH PLACEMENT    . PORTACATH PLACEMENT Left 02/23/2013   Procedure: INSERTION PORT-A-CATH;  Surgeon: Donato Heinz, MD;  Location: [facility] OR[facility];  Service: General;  Laterality: Left;  . TEE WITHOUT CARDIOVERSION N/A 08/07/2013   Procedure: TRANSESOPHAGEAL ECHOCARDIOGRAM (TEE);  Surgeon: Arnoldo Lenis, MD;  Location: [facility] END[facility] SUITE;  Service: Cardiology;  Laterality: N/A;    Social History   Social History  . Marital status: Divorced    Spouse name: N/A  . Number of children: N/A  . Years of education: N/A   Occupational History  . Not on file.   Social  History Main Topics  . Smoking status: Never Smoker  . Smokeless tobacco: Never Used  . Alcohol use No  . Drug use: No  . Sexual activity: Yes    Birth control/ protection: IUD   Other Topics Concern  . Not on file   Social History Narrative  . No narrative on file     No family history of premature CAD in 1st degree relatives.  Prior to Admission medications   Medication Sig Start Date End Date Taking? Authorizing Provider  aspirin EC 81 MG EC tablet Take 1 tablet (81 mg total) by mouth daily. 10/18/16  Yes Leana Gamer, MD  folic acid (FOLVITE) 1 MG tablet Take 1 tablet (1 mg total) by mouth daily. 03/14/16  Yes [name], FNP  hydroxyurea (HYDREA) 500 MG capsule Take 2 capsules (1,000 mg total) by mouth daily. May take with food to minimize GI side effects. 03/14/16  Yes [name], FNP  levonorgestrel (MIRENA) 20 MCG/24HR IUD 1 each by Intrauterine route once.   Yes Historical Provider, MD  loratadine (CLARITIN) 10 MG tablet Take 10 mg by mouth daily.   Yes Historical Provider, MD  morphine (MS CONTIN) 15 MG 12 hr tablet Take 1 tablet (15 mg total) by mouth every 12 (twelve) hours. 11/01/16  Yes Tresa Garter, MD  Multiple Vitamin (MULTIVITAMIN WITH MINERALS) TABS tablet Take 1 tablet by mouth daily.   Yes Historical Provider, MD  Oxycodone HCl 10 MG TABS Take 1 tablet (10 mg total) by mouth every 4 (four) hours as needed. 11/01/16  Yes Tresa Garter, MD  promethazine (PHENERGAN) 12.5 MG tablet Take 1 tablet (12.5 mg total) by mouth every 6 (six) hours as needed for nausea or vomiting. 11/01/16  Yes Tresa Garter, MD  Vitamin D, Ergocalciferol, (DRISDOL) 50000 units CAPS capsule Take 1 capsule (50,000 Units total) by mouth every 7 (seven) days. Takes on Sundays. 06/01/16  Yes Micheline Chapman, NP     Review of systems complete and found to be negative unless listed above in HPI     Physical exam Blood pressure (!) 121/58, pulse 83, temperature  98.2 F (36.8 C), temperature source Oral, resp. rate 18, height 5\' 3"  (1.6 m), weight 163 lb (73.9 kg), SpO2 97 %. General: NAD Neck: No JVD, no thyromegaly or thyroid nodule.  Lungs: Clear to auscultation bilaterally with normal respiratory effort. CV: Nondisplaced PMI. Regular rate and rhythm, normal S1/S2, no S3/S4, no murmur.  No peripheral edema.  Abdomen: Soft, nontender, no distention.  Skin: Intact without lesions or rashes.  Neurologic: Alert and oriented x 3.  Psych: Normal affect. Extremities: No clubbing or cyanosis.  HEENT: Normal.   ECG: Most recent ECG reviewed.  Labs:   Lab Results  Component Value Date   WBC 9.3 11/14/2016   HGB 7.0 (L) 11/14/2016   HCT 19.5 (L) 11/14/2016   MCV 96.5 11/14/2016   PLT 132 (L) 11/14/2016    Recent Labs Lab 11/14/16 0643  NA 133*  K 3.3*  CL 105  CO2 23  BUN <5*  CREATININE 0.45  CALCIUM 7.9*  PROT 6.5  BILITOT 12.8*  ALKPHOS 92  ALT 29  AST 89*  GLUCOSE 88   Lab Results  Component Value Date   CKTOTAL 21 08/20/2010   CKMB 0.4 08/20/2010   TROPONINI <0.03 10/03/2016   No results found for: CHOL No results found for: HDL No results found for: LDLCALC No results found for: TRIG No results found for: [facility] Lab Results  Component Value Date   LDLDIRECT 55 07/08/2012         Studies: No results found.  ASSESSMENT AND PLAN:  1. MRSA bacteremia: The patient is hemodynamically stable. There are no murmurs on exam. Hgb 7 on 2/21. At this time, I do not feel she is need of TEE. I think pursuing a TTE is reasonable to evaluate for vegetations. If there are any suspicious findings, I think a TEE would then be reasonable once her  hemoglobin is higher as well. I have spoken with Dr. Carlyle Basques (Infectious Disease) to discuss with her as well. She thinks repeating a TTE at the end of her antibiotic course would be sufficient at this time.  No further recommendations at this time.   Signed: Kate Sable, M.D., F.A.C.C.  11/15/2016, 10:21 AM

## 2016-11-16 ENCOUNTER — Other Ambulatory Visit (HOSPITAL_COMMUNITY): Payer: Medicare Other

## 2016-11-16 LAB — COMPREHENSIVE METABOLIC PANEL
ALT: 25 U/L (ref 14–54)
AST: 90 U/L — AB (ref 15–41)
Albumin: 2.2 g/dL — ABNORMAL LOW (ref 3.5–5.0)
Alkaline Phosphatase: 122 U/L (ref 38–126)
Anion gap: 5 (ref 5–15)
BUN: 5 mg/dL — AB (ref 6–20)
CHLORIDE: 107 mmol/L (ref 101–111)
CO2: 23 mmol/L (ref 22–32)
CREATININE: 0.48 mg/dL (ref 0.44–1.00)
Calcium: 8.3 mg/dL — ABNORMAL LOW (ref 8.9–10.3)
GFR calc non Af Amer: >60 mL/min (ref >60)
Glucose, Bld: 101 mg/dL — ABNORMAL HIGH (ref 65–99)
POTASSIUM: 3.8 mmol/L (ref 3.5–5.1)
SODIUM: 135 mmol/L (ref 135–145)
Total Bilirubin: 11.4 mg/dL — ABNORMAL HIGH (ref 0.3–1.2)
Total Protein: 6.9 g/dL (ref 6.5–8.1)

## 2016-11-16 LAB — CBC WITH DIFFERENTIAL/PLATELET
BASOS ABS: 0.1 10*3/uL (ref 0.0–0.1)
Basophils Relative: 1 %
EOS ABS: 0.3 10*3/uL (ref 0.0–0.7)
Eosinophils Relative: 3 %
HCT: 20.3 % — ABNORMAL LOW (ref 36.0–46.0)
Hemoglobin: 7.4 g/dL — ABNORMAL LOW (ref 12.0–15.0)
LYMPHS ABS: 3.4 10*3/uL (ref 0.7–4.0)
Lymphocytes Relative: 31 %
MCH: 35.9 pg — AB (ref 26.0–34.0)
MCHC: 36.5 g/dL — ABNORMAL HIGH (ref 30.0–36.0)
MCV: 98.5 fL (ref 78.0–100.0)
Monocytes Absolute: 1.1 10*3/uL — ABNORMAL HIGH (ref 0.1–1.0)
Monocytes Relative: 10 %
Neutro Abs: 6.2 10*3/uL (ref 1.7–7.7)
Neutrophils Relative %: 56 %
PLATELETS: 206 10*3/uL (ref 150–400)
RBC: 2.06 MIL/uL — AB (ref 3.87–5.11)
RDW: 18.8 % — ABNORMAL HIGH (ref 11.5–15.5)
WBC: 11.1 10*3/uL — AB (ref 4.0–10.5)

## 2016-11-16 MED ORDER — HYDROMORPHONE HCL 2 MG/ML IJ SOLN
2.0000 mg | INTRAMUSCULAR | Status: DC | PRN
  Administered 2016-11-16 – 2016-11-17 (×10): 2 mg via INTRAVENOUS
  Filled 2016-11-16 (×10): qty 1

## 2016-11-16 MED ORDER — HYDROMORPHONE HCL 2 MG/ML IJ SOLN: 2.0000 mg | INTRAMUSCULAR | Status: DC | PRN

## 2016-11-16 NOTE — Progress Notes (Signed)
PROGRESS NOTE    JOYCELIN [name]  [id number] DOB: 1980-11-06 DOA: 11/11/2016 PCP: Angelica Chessman, MD    Brief Narrative:  Patient is a 36 HbSS admitted for sickle cell crisis, found to have bacteremia with 2/2 Staph Aureus (MSSA), likely line infection. Appreciate Dr Storm Frisk recommendation. She had her port removed on 2/21.  She was transfused 1 unit of PRBC bringing her Hb from 6.2 g to 7 g on 2/21; her total bili improved to 12 from 18 as well.   She is now on Ancef 2g IV q 8.     Assessment & Plan:   Principal Problem:   Sickle cell crisis (HCC) Active Problems:   Anemia of chronic disease   Pulmonary hypertension   Line sepsis - Port removed Feb 21/18 - Will order PICC placement ordered - Will need 4 weeks of IV antibiotics - Cardiology saw patient and discussed getting TTE but state patient more appropriate for TEE and repeat TEE after antibiotic completion - will need repeat TEE at end of antibiotic course  Sickle cell crisis - One unit pRBC given - Continue with IVF  - Continue with IV dilaudid but increase time interval between dosing - Patient reports improvement in her pain but still taking diluadid almost every 2-3 hours  Hypokalemia - K supplementation in patient's IVF - WNL this am   DVT prophylaxis: SubQ heparin Code Status: Full code Family Communication: no family bedside Disposition Plan: will plan for TEE and placement of PICC line then setting patient up for 4 weeks of outpt IV antibiotics   Consultants:   Surgery  Cardiology  ID (via telephone)  Procedures:   Echocardiogram  Antimicrobials:   Ancef    Subjective: Patient reports that her pain is well controlled but she is needing pain medication to control it.  She ate breakfast well and slept relatively ok.  No overnight events noted.  Objective: Vitals:   11/15/16 0532 11/15/16 1427 11/16/16 0629 11/16/16 1316  BP: (!) 121/58 128/70 135/76 (!) 147/89  Pulse: 83 91 92 87    Resp: 18 18 20 16   Temp: 98.2 F (36.8 C) 98.3 F (36.8 C) 99 F (37.2 C) 98.4 F (36.9 C)  TempSrc: Oral Oral Oral Oral  SpO2: 97% 96% 98% 97%  Weight:      Height:        Intake/Output Summary (Last 24 hours) at 11/16/16 1524 Last data filed at 11/16/16 1300  Gross per 24 hour  Intake              720 ml  Output             1000 ml  Net             -280 ml   Filed Weights   11/11/16 0818 11/11/16 1533  Weight: 73.9 kg (163 lb) 73.9 kg (163 lb)    Examination:  General exam: Appears calm and comfortable  Respiratory system: Clear to auscultation. Respiratory effort normal. Cardiovascular system: S1 & S2 heard, RRR. No JVD, murmurs, rubs, gallops or clicks. No pedal edema. Gastrointestinal system: Abdomen is nondistended, soft and nontender. No organomegaly or masses felt. Normal bowel sounds heard. Central nervous system: Alert and oriented. No focal neurological deficits. Extremities: Symmetric 5 x 5 power. Skin: No rashes, lesions or ulcers Psychiatry: Judgement and insight appear normal. Mood & affect appropriate.     Data Reviewed: I have personally reviewed following labs and imaging studies  CBC:  Recent Labs  Lab 11/11/16 0934 11/12/16 0639 11/13/16 0621 11/14/16 0643 11/16/16 0906  WBC 10.9* 12.1* 9.7 9.3 11.1*  NEUTROABS 7.5  --  6.3 5.0 6.2  HGB 6.5* 6.7* 6.2* 7.0* 7.4*  HCT 18.2* 17.9* 16.8* 19.5* 20.3*  MCV 97.8 95.7 96.0 96.5 98.5  PLT 114* 99* 99* 132* [id number]   Basic Metabolic Panel:  Recent Labs Lab 11/11/16 0934 11/12/16 0639 11/13/16 0621 11/14/16 0643 11/16/16 0906  NA 136 132* 132* 133* 135  K 3.3* 3.1* 2.8* 3.3* 3.8  CL 106 104 104 105 107  CO2 23 22 23 23 23   GLUCOSE 102* 125* 128* 88 101*  BUN 7 6 5* <5* 5*  CREATININE 0.47 0.47 0.38* 0.45 0.48  CALCIUM 8.2* 7.7* 7.7* 7.9* 8.3*   GFR: Estimated Creatinine Clearance: 94.5 mL/min (by C-G formula based on SCr of 0.48 mg/dL). Liver Function Tests:  Recent Labs Lab  11/11/16 0934 11/13/16 0621 11/14/16 0643 11/16/16 0906  AST 138* 94* 89* 90*  ALT 41 31 29 25   ALKPHOS 103 83 92 122  BILITOT 18.2* 14.9* 12.8* 11.4*  PROT 7.1 6.4* 6.5 6.9  ALBUMIN 2.4* 2.0* 2.1* 2.2*   No results for input(s): LIPASE, AMYLASE in the last 168 hours. No results for input(s): AMMONIA in the last 168 hours. Coagulation Profile: No results for input(s): INR, PROTIME in the last 168 hours. Cardiac Enzymes: No results for input(s): CKTOTAL, CKMB, CKMBINDEX, TROPONINI in the last 168 hours. BNP (last 3 results) No results for input(s): PROBNP in the last 8760 hours. HbA1C: No results for input(s): HGBA1C in the last 72 hours. CBG: No results for input(s): GLUCAP in the last 168 hours. Lipid Profile: No results for input(s): CHOL, HDL, LDLCALC, TRIG, CHOLHDL, LDLDIRECT in the last 72 hours. Thyroid Function Tests: No results for input(s): TSH, T4TOTAL, FREET4, T3FREE, THYROIDAB in the last 72 hours. Anemia Panel: No results for input(s): VITAMINB12, FOLATE, FERRITIN, TIBC, IRON, RETICCTPCT in the last 72 hours. Sepsis Labs: No results for input(s): PROCALCITON, LATICACIDVEN in the last 168 hours.  Recent Results (from the past 240 hour(s))  Culture, blood (Routine X 2) w Reflex to ID Panel     Status: Abnormal   Collection Time: 11/11/16  6:43 PM  Result Value Ref Range Status   Specimen Description BLOOD RIGHT HAND  Final   Special Requests BOTTLES DRAWN AEROBIC AND ANAEROBIC [facility] EACH  Final   Culture  Setup Time   Final    GRAM POSITIVE COCCI IN CLUSTERS Gram Stain Report Called to,Read Back By and Verified With: MAYS J. AT 1115A ON QB:[id number] BY THOMPSON S. IN BOTH AEROBIC AND ANAEROBIC BOTTLES CRITICAL RESULT CALLED TO, READ BACK BY AND VERIFIED WITH: S. Hall Pharm.D. 16:35 11/12/16 ([email or web address])    Culture (A)  Final    STAPHYLOCOCCUS AUREUS SUSCEPTIBILITIES PERFORMED ON PREVIOUS CULTURE WITHIN THE LAST 5 DAYS. Performed at [facility], [place] [address] [place], [place] [address]    Report Status 11/14/2016 FINAL  Final  Culture, blood (Routine X 2) w Reflex to ID Panel     Status: Abnormal   Collection Time: 11/11/16  6:45 PM  Result Value Ref Range Status   Specimen Description BLOOD LEFT HAND  Final   Special Requests BOTTLES DRAWN AEROBIC AND ANAEROBIC [facility] EACH  Final   Culture  Setup Time   Final    GRAM POSITIVE COCCI IN CLUSTERS Gram Stain Report Called to,Read Back By and Verified With: MAYS J. AT [place] ON QB:[id number]  BY THOMPSON S. IN BOTH AEROBIC AND ANAEROBIC BOTTLES Organism ID to follow CRITICAL RESULT CALLED TO, READ BACK BY AND VERIFIED WITH: S. Hall Pharm.D. 16:35 11/12/16 ([email or web address]) Performed at [facility], [place] [address], [place], [place] [address]    Culture STAPHYLOCOCCUS AUREUS (A)  Final   Report Status 11/14/2016 FINAL  Final   Organism ID, Bacteria STAPHYLOCOCCUS AUREUS  Final      Susceptibility   Staphylococcus aureus - MIC*    CIPROFLOXACIN <=0.5 SENSITIVE Sensitive     ERYTHROMYCIN <=0.25 SENSITIVE Sensitive     GENTAMICIN <=0.5 SENSITIVE Sensitive     OXACILLIN <=0.25 SENSITIVE Sensitive     TETRACYCLINE <=1 SENSITIVE Sensitive     VANCOMYCIN 1 SENSITIVE Sensitive     TRIMETH/SULFA <=10 SENSITIVE Sensitive     CLINDAMYCIN <=0.25 SENSITIVE Sensitive     RIFAMPIN <=0.5 SENSITIVE Sensitive     Inducible Clindamycin NEGATIVE Sensitive     * STAPHYLOCOCCUS AUREUS  Blood Culture ID Panel (Reflexed)     Status: Abnormal   Collection Time: 11/11/16  6:45 PM  Result Value Ref Range Status   Enterococcus species NOT DETECTED NOT DETECTED Final   Listeria monocytogenes NOT DETECTED NOT DETECTED Final   Staphylococcus species DETECTED (A) NOT DETECTED Final    Comment: CRITICAL RESULT CALLED TO, READ BACK BY AND VERIFIED WITH: S. Hall Pharm.D. 16:35 11/12/16 ([email or web address])    Staphylococcus aureus DETECTED (A) NOT DETECTED Final    Comment: Methicillin (oxacillin) susceptible Staphylococcus aureus (MSSA).  Preferred therapy is anti staphylococcal beta lactam antibiotic (Cefazolin or Nafcillin), unless clinically contraindicated. CRITICAL RESULT CALLED TO, READ BACK BY AND VERIFIED WITH: S. Hall Pharm.D. 16:35 11/12/16 (wilsonm)    Methicillin resistance NOT DETECTED NOT DETECTED Final   Streptococcus species NOT DETECTED NOT DETECTED Final   Streptococcus agalactiae NOT DETECTED NOT DETECTED Final   Streptococcus pneumoniae NOT DETECTED NOT DETECTED Final   Streptococcus pyogenes NOT DETECTED NOT DETECTED Final   Acinetobacter baumannii NOT DETECTED NOT DETECTED Final   Enterobacteriaceae species NOT DETECTED NOT DETECTED Final   Enterobacter cloacae complex NOT DETECTED NOT DETECTED Final   Escherichia coli NOT DETECTED NOT DETECTED Final   Klebsiella oxytoca NOT DETECTED NOT DETECTED Final   Klebsiella pneumoniae NOT DETECTED NOT DETECTED Final   Proteus species NOT DETECTED NOT DETECTED Final   Serratia marcescens NOT DETECTED NOT DETECTED Final   Haemophilus influenzae NOT DETECTED NOT DETECTED Final   Neisseria meningitidis NOT DETECTED NOT DETECTED Final   Pseudomonas aeruginosa NOT DETECTED NOT DETECTED Final   Candida albicans NOT DETECTED NOT DETECTED Final   Candida glabrata NOT DETECTED NOT DETECTED Final   Candida krusei NOT DETECTED NOT DETECTED Final   Candida parapsilosis NOT DETECTED NOT DETECTED Final   Candida tropicalis NOT DETECTED NOT DETECTED Final    Comment: Performed at [facility], [address] [place], [place] [address]  Culture, blood (Routine X 2) w Reflex to ID Panel     Status: Abnormal   Collection Time: 11/12/16  8:22 AM  Result Value Ref Range Status   Specimen Description PORTA CATH DRAWN BY RN  Final   Special Requests BOTTLES DRAWN AEROBIC AND ANAEROBIC 6CC  Final   Culture  Setup Time   Final    GRAM POSITIVE COCCI Gram Stain Report Called to,Read Back By and Verified With: [place]. AT 1932 ON 11/12/16 BY EVA IN BOTH AEROBIC AND ANAEROBIC  BOTTLES CRITICAL VALUE NOTED.  VALUE IS CONSISTENT WITH PREVIOUSLY  REPORTED AND CALLED VALUE.    Culture (A)  Final    STAPHYLOCOCCUS AUREUS SUSCEPTIBILITIES PERFORMED ON PREVIOUS CULTURE WITHIN THE LAST 5 DAYS. Performed at [facility], [place] [address] [place], [place] [address]    Report Status 11/14/2016 FINAL  Final  Culture, blood (Routine X 2) w Reflex to ID Panel     Status: Abnormal   Collection Time: 11/12/16  8:44 AM  Result Value Ref Range Status   Specimen Description PORTA CATH DRAWN BY RN  Final   Special Requests BOTTLES DRAWN AEROBIC AND ANAEROBIC 8CC  Final   Culture  Setup Time   Final    GRAM POSITIVE COCCI IN CLUSTERS Gram Stain Report Called to,Read Back By and Verified With: MAYS J. AT 1309 ON [id number] BY THOMPSON S.    Culture (A)  Final    STAPHYLOCOCCUS AUREUS SUSCEPTIBILITIES PERFORMED ON PREVIOUS CULTURE WITHIN THE LAST 5 DAYS. Performed at [facility], [place] [address] [place], [place] [address]    Report Status 11/15/2016 FINAL  Final  Culture, blood (Routine X 2) w Reflex to ID Panel     Status: None (Preliminary result)   Collection Time: 11/13/16 11:05 AM  Result Value Ref Range Status   Specimen Description BLOOD LEFT HAND  Final   Special Requests BOTTLES DRAWN AEROBIC ONLY 6CC  Final   Culture NO GROWTH 3 DAYS  Final   Report Status PENDING  Incomplete  Surgical pcr screen     Status: None   Collection Time: 11/13/16  7:38 PM  Result Value Ref Range Status   MRSA, PCR NEGATIVE NEGATIVE Final   Staphylococcus aureus NEGATIVE NEGATIVE Final    Comment:        The Xpert SA Assay (FDA approved for NASAL specimens in patients over 71 years of age), is one component of a comprehensive surveillance program.  Test performance has been validated by [facility] for patients greater than or equal to 63 year old. It is not intended to diagnose infection nor to guide or monitor treatment.   Cath Tip Culture     Status: None  (Preliminary result)   Collection Time: 11/14/16  9:03 AM  Result Value Ref Range Status   Specimen Description CATH TIP  Final   Special Requests NONE  Final   Culture   Final    NO GROWTH 2 DAYS Performed at [facility], [address], [place], [place] [address]    Report Status PENDING  Incomplete         Radiology Studies: No results found.      Scheduled Meds: . sodium chloride   Intravenous Once  . aspirin EC  81 mg Oral Daily  .  ceFAZolin (ANCEF) IV  2 g Intravenous [id number]  . folic acid  1 mg Oral Daily  . heparin  5,000 Units Subcutaneous Q8H  . hydroxyurea  1,000 mg Oral Daily  . morphine  15 mg Oral Q12H  . multivitamin with minerals  1 tablet Oral Daily  . potassium chloride  40 mEq Oral Daily   Continuous Infusions: . 0.45 % NaCl with KCl 20 mEq / L 125 mL/hr at 11/15/16 2210     LOS: 5 days    Time spent: 30 minutes    Loretha Stapler, MD [facility] Pager [phone]  If 7PM-7AM, please contact night-coverage [email or web address] Password [email or web address] 11/16/2016, 3:24 PM

## 2016-11-16 NOTE — Care Management Important Message (Signed)
Important Message  Patient Details  Name: Dawn [name] MRN: [id number] Date of Birth: [birth year]   Medicare Important Message Given:  Yes    Lorren Splawn, Chauncey Reading, RN 11/16/2016, 9:36 AM

## 2016-11-17 ENCOUNTER — Inpatient Hospital Stay (HOSPITAL_COMMUNITY): Payer: Medicare Other

## 2016-11-17 DIAGNOSIS — I340 Nonrheumatic mitral (valve) insufficiency: Secondary | ICD-10-CM

## 2016-11-17 LAB — ECHOCARDIOGRAM COMPLETE
Height: 63 in
WEIGHTICAEL: 2608 [oz_av]

## 2016-11-17 LAB — CATH TIP CULTURE: Culture: NO GROWTH

## 2016-11-17 MED ORDER — HYDROMORPHONE HCL 2 MG/ML IJ SOLN
2.0000 mg | INTRAMUSCULAR | Status: DC | PRN
  Administered 2016-11-17 – 2016-11-20 (×28): 2 mg via INTRAVENOUS
  Filled 2016-11-17 (×28): qty 1

## 2016-11-17 NOTE — Progress Notes (Signed)
  Echocardiogram 2D Echocardiogram has been performed.  Diamond Nickel 11/17/2016, 8:55 AM

## 2016-11-17 NOTE — Progress Notes (Signed)
PROGRESS NOTE    Dawn [name]  [id number] DOB: Sep 13, 1981 DOA: 11/11/2016 PCP: Angelica Chessman, MD    Brief Narrative:  Patient is a 35 HbSS admitted for sickle cell crisis, found to have bacteremia with 2/2 Staph Aureus (MSSA), likely line infection. Appreciate Dr Storm Frisk recommendation. She had her port removed on 2/21.  She was transfused 1 unit of PRBC bringing her Hb from 6.2 g to 7 g on 2/21; her total bili improved to 12 from 18 as well.   She is now on Ancef 2g IV q 8.     Assessment & Plan:   Principal Problem:   Sickle cell crisis (HCC) Active Problems:   Anemia of chronic disease   Pulmonary hypertension   Line sepsis - Port removed Feb 21/18 - Will order PICC placement ordered - Will need 4 weeks of IV antibiotics - Cardiology saw patient and discussed getting TTE but state patient more appropriate for TEE and repeat TEE after antibiotic completion - will need repeat TEE at end of antibiotic course - TTE not showing any vegetations  Sickle cell crisis - One unit pRBC given - Continue with IVF  - Continue with IV dilaudid but increase time interval between dosing - Patient reports improvement in her pain but still taking diluadid almost every 2-3 hours - continue IVF and diluadid for pain control - repeat CBC in am  Hypokalemia - K supplementation in patient's IVF - repeat BMP in am   DVT prophylaxis: SubQ heparin Code Status: Full code Family Communication: no family bedside Disposition Plan: pending resolution of sickle cell crisis   Consultants:   Surgery  Cardiology  ID (via telephone)  Procedures:   Echocardiogram  Antimicrobials:   Ancef    Subjective: Patient says her pain is still quite intense.  She is asking for diluadid to be increased to every 2 hours.  Had TTE this am which showed no vegetations.   Objective: Vitals:   11/16/16 1316 11/16/16 2100 11/17/16 0500 11/17/16 1601  BP: (!) 147/89 (!) 146/84 119/66 (!)  142/76  Pulse: 87 85 89 87  Resp: 16 20 20 16   Temp: 98.4 F (36.9 C) 98.6 F (37 C) 98.1 F (36.7 C) 98.6 F (37 C)  TempSrc: Oral Oral Oral Oral  SpO2: 97% 97% 100% 95%  Weight:      Height:        Intake/Output Summary (Last 24 hours) at 11/17/16 1655 Last data filed at 11/17/16 1500  Gross per 24 hour  Intake          3962.91 ml  Output                0 ml  Net          3962.91 ml   Filed Weights   11/11/16 0818 11/11/16 1533  Weight: 73.9 kg (163 lb) 73.9 kg (163 lb)    Examination:  General exam: Appears calm and comfortable  Respiratory system: Clear to auscultation. Respiratory effort normal. Cardiovascular system: S1 & S2 heard, RRR. No JVD, murmurs, rubs, gallops or clicks. No pedal edema. Gastrointestinal system: Abdomen is nondistended, soft and nontender. No organomegaly or masses felt. Normal bowel sounds heard. Central nervous system: Alert and oriented. No focal neurological deficits. Extremities: Symmetric 5 x 5 power (patient reports pain in her wrists and knees but not on palpation) Skin: No rashes, lesions or ulcers Psychiatry: Judgement and insight appear normal. Mood & affect appropriate.     Data Reviewed:  I have personally reviewed following labs and imaging studies  CBC:  Recent Labs Lab 11/11/16 0934 11/12/16 0639 11/13/16 0621 11/14/16 0643 11/16/16 0906  WBC 10.9* 12.1* 9.7 9.3 11.1*  NEUTROABS 7.5  --  6.3 5.0 6.2  HGB 6.5* 6.7* 6.2* 7.0* 7.4*  HCT 18.2* 17.9* 16.8* 19.5* 20.3*  MCV 97.8 95.7 96.0 96.5 98.5  PLT 114* 99* 99* 132* [id number]   Basic Metabolic Panel:  Recent Labs Lab 11/11/16 0934 11/12/16 0639 11/13/16 0621 11/14/16 0643 11/16/16 0906  NA 136 132* 132* 133* 135  K 3.3* 3.1* 2.8* 3.3* 3.8  CL 106 104 104 105 107  CO2 23 22 23 23 23   GLUCOSE 102* 125* 128* 88 101*  BUN 7 6 5* <5* 5*  CREATININE 0.47 0.47 0.38* 0.45 0.48  CALCIUM 8.2* 7.7* 7.7* 7.9* 8.3*   GFR: Estimated Creatinine Clearance: 94.5 mL/min  (by C-G formula based on SCr of 0.48 mg/dL). Liver Function Tests:  Recent Labs Lab 11/11/16 0934 11/13/16 0621 11/14/16 0643 11/16/16 0906  AST 138* 94* 89* 90*  ALT 41 31 29 25   ALKPHOS 103 83 92 122  BILITOT 18.2* 14.9* 12.8* 11.4*  PROT 7.1 6.4* 6.5 6.9  ALBUMIN 2.4* 2.0* 2.1* 2.2*   No results for input(s): LIPASE, AMYLASE in the last 168 hours. No results for input(s): AMMONIA in the last 168 hours. Coagulation Profile: No results for input(s): INR, PROTIME in the last 168 hours. Cardiac Enzymes: No results for input(s): CKTOTAL, CKMB, CKMBINDEX, TROPONINI in the last 168 hours. BNP (last 3 results) No results for input(s): PROBNP in the last 8760 hours. HbA1C: No results for input(s): HGBA1C in the last 72 hours. CBG: No results for input(s): GLUCAP in the last 168 hours. Lipid Profile: No results for input(s): CHOL, HDL, LDLCALC, TRIG, CHOLHDL, LDLDIRECT in the last 72 hours. Thyroid Function Tests: No results for input(s): TSH, T4TOTAL, FREET4, T3FREE, THYROIDAB in the last 72 hours. Anemia Panel: No results for input(s): VITAMINB12, FOLATE, FERRITIN, TIBC, IRON, RETICCTPCT in the last 72 hours. Sepsis Labs: No results for input(s): PROCALCITON, LATICACIDVEN in the last 168 hours.  Recent Results (from the past 240 hour(s))  Culture, blood (Routine X 2) w Reflex to ID Panel     Status: Abnormal   Collection Time: 11/11/16  6:43 PM  Result Value Ref Range Status   Specimen Description BLOOD RIGHT HAND  Final   Special Requests BOTTLES DRAWN AEROBIC AND ANAEROBIC [facility] EACH  Final   Culture  Setup Time   Final    GRAM POSITIVE COCCI IN CLUSTERS Gram Stain Report Called to,Read Back By and Verified With: MAYS J. AT 1115A ON [id number] BY THOMPSON S. IN BOTH AEROBIC AND ANAEROBIC BOTTLES CRITICAL RESULT CALLED TO, READ BACK BY AND VERIFIED WITH: S. Hall Pharm.D. 16:35 11/12/16 (wilsonm)    Culture (A)  Final    STAPHYLOCOCCUS AUREUS SUSCEPTIBILITIES PERFORMED ON  PREVIOUS CULTURE WITHIN THE LAST 5 DAYS. Performed at [facility], [place] [address] [place], [place] [address]    Report Status 11/14/2016 FINAL  Final  Culture, blood (Routine X 2) w Reflex to ID Panel     Status: Abnormal   Collection Time: 11/11/16  6:45 PM  Result Value Ref Range Status   Specimen Description BLOOD LEFT HAND  Final   Special Requests BOTTLES DRAWN AEROBIC AND ANAEROBIC [facility] EACH  Final   Culture  Setup Time   Final    GRAM POSITIVE COCCI IN CLUSTERS Gram Stain  Report Called to,Read Back By and Verified With: MAYS J. AT 1115A ON [id number] BY THOMPSON S. IN BOTH AEROBIC AND ANAEROBIC BOTTLES Organism ID to follow CRITICAL RESULT CALLED TO, READ BACK BY AND VERIFIED WITH: S. Hall Pharm.D. 16:35 11/12/16 ([email or web address]) Performed at [facility], [place] [address], [place], [place] [address]    Culture STAPHYLOCOCCUS AUREUS (A)  Final   Report Status 11/14/2016 FINAL  Final   Organism ID, Bacteria STAPHYLOCOCCUS AUREUS  Final      Susceptibility   Staphylococcus aureus - MIC*    CIPROFLOXACIN <=0.5 SENSITIVE Sensitive     ERYTHROMYCIN <=0.25 SENSITIVE Sensitive     GENTAMICIN <=0.5 SENSITIVE Sensitive     OXACILLIN <=0.25 SENSITIVE Sensitive     TETRACYCLINE <=1 SENSITIVE Sensitive     VANCOMYCIN 1 SENSITIVE Sensitive     TRIMETH/SULFA <=10 SENSITIVE Sensitive     CLINDAMYCIN <=0.25 SENSITIVE Sensitive     RIFAMPIN <=0.5 SENSITIVE Sensitive     Inducible Clindamycin NEGATIVE Sensitive     * STAPHYLOCOCCUS AUREUS  Blood Culture ID Panel (Reflexed)     Status: Abnormal   Collection Time: 11/11/16  6:45 PM  Result Value Ref Range Status   Enterococcus species NOT DETECTED NOT DETECTED Final   Listeria monocytogenes NOT DETECTED NOT DETECTED Final   Staphylococcus species DETECTED (A) NOT DETECTED Final    Comment: CRITICAL RESULT CALLED TO, READ BACK BY AND VERIFIED WITH: S. Hall Pharm.D. 16:35 11/12/16 (wilsonm)    Staphylococcus aureus DETECTED (A) NOT DETECTED  Final    Comment: Methicillin (oxacillin) susceptible Staphylococcus aureus (MSSA). Preferred therapy is anti staphylococcal beta lactam antibiotic (Cefazolin or Nafcillin), unless clinically contraindicated. CRITICAL RESULT CALLED TO, READ BACK BY AND VERIFIED WITH: S. Hall Pharm.D. 16:35 11/12/16 (wilsonm)    Methicillin resistance NOT DETECTED NOT DETECTED Final   Streptococcus species NOT DETECTED NOT DETECTED Final   Streptococcus agalactiae NOT DETECTED NOT DETECTED Final   Streptococcus pneumoniae NOT DETECTED NOT DETECTED Final   Streptococcus pyogenes NOT DETECTED NOT DETECTED Final   Acinetobacter baumannii NOT DETECTED NOT DETECTED Final   Enterobacteriaceae species NOT DETECTED NOT DETECTED Final   Enterobacter cloacae complex NOT DETECTED NOT DETECTED Final   Escherichia coli NOT DETECTED NOT DETECTED Final   Klebsiella oxytoca NOT DETECTED NOT DETECTED Final   Klebsiella pneumoniae NOT DETECTED NOT DETECTED Final   Proteus species NOT DETECTED NOT DETECTED Final   Serratia marcescens NOT DETECTED NOT DETECTED Final   Haemophilus influenzae NOT DETECTED NOT DETECTED Final   Neisseria meningitidis NOT DETECTED NOT DETECTED Final   Pseudomonas aeruginosa NOT DETECTED NOT DETECTED Final   Candida albicans NOT DETECTED NOT DETECTED Final   Candida glabrata NOT DETECTED NOT DETECTED Final   Candida krusei NOT DETECTED NOT DETECTED Final   Candida parapsilosis NOT DETECTED NOT DETECTED Final   Candida tropicalis NOT DETECTED NOT DETECTED Final    Comment: Performed at [facility], [address] [place], [place] [address]  Culture, blood (Routine X 2) w Reflex to ID Panel     Status: Abnormal   Collection Time: 11/12/16  8:22 AM  Result Value Ref Range Status   Specimen Description PORTA CATH DRAWN BY RN  Final   Special Requests BOTTLES DRAWN AEROBIC AND ANAEROBIC 6CC  Final   Culture  Setup Time   Final    GRAM POSITIVE COCCI Gram Stain Report Called to,Read Back By  and Verified With: THOMAS,C. AT 1932 ON 11/12/16 BY EVA IN  BOTH AEROBIC AND ANAEROBIC BOTTLES CRITICAL VALUE NOTED.  VALUE IS CONSISTENT WITH PREVIOUSLY REPORTED AND CALLED VALUE.    Culture (A)  Final    STAPHYLOCOCCUS AUREUS SUSCEPTIBILITIES PERFORMED ON PREVIOUS CULTURE WITHIN THE LAST 5 DAYS. Performed at [facility], [place] [address], [place], [place] [address]    Report Status 11/14/2016 FINAL  Final  Culture, blood (Routine X 2) w Reflex to ID Panel     Status: Abnormal   Collection Time: 11/12/16  8:44 AM  Result Value Ref Range Status   Specimen Description PORTA CATH DRAWN BY RN  Final   Special Requests BOTTLES DRAWN AEROBIC AND ANAEROBIC 8CC  Final   Culture  Setup Time   Final    GRAM POSITIVE COCCI IN CLUSTERS Gram Stain Report Called to,Read Back By and Verified With: MAYS J. AT 1309 ON [id number] BY THOMPSON S.    Culture (A)  Final    STAPHYLOCOCCUS AUREUS SUSCEPTIBILITIES PERFORMED ON PREVIOUS CULTURE WITHIN THE LAST 5 DAYS. Performed at [facility], [place] [address], [place], [place] [address]    Report Status 11/15/2016 FINAL  Final  Culture, blood (Routine X 2) w Reflex to ID Panel     Status: None (Preliminary result)   Collection Time: 11/13/16 11:05 AM  Result Value Ref Range Status   Specimen Description BLOOD LEFT HAND  Final   Special Requests BOTTLES DRAWN AEROBIC ONLY 6CC  Final   Culture NO GROWTH 4 DAYS  Final   Report Status PENDING  Incomplete  Surgical pcr screen     Status: None   Collection Time: 11/13/16  7:38 PM  Result Value Ref Range Status   MRSA, PCR NEGATIVE NEGATIVE Final   Staphylococcus aureus NEGATIVE NEGATIVE Final    Comment:        The Xpert SA Assay (FDA approved for NASAL specimens in patients over 26 years of age), is one component of a comprehensive surveillance program.  Test performance has been validated by [facility] for patients greater than or equal to 79 year old. It is not intended to diagnose  infection nor to guide or monitor treatment.   Cath Tip Culture     Status: None   Collection Time: 11/14/16  9:03 AM  Result Value Ref Range Status   Specimen Description CATH TIP  Final   Special Requests NONE  Final   Culture   Final    NO GROWTH 3 DAYS Performed at [facility], [address] [place], [place] [address]    Report Status 11/17/2016 FINAL  Final         Radiology Studies: No results found.      Scheduled Meds: . sodium chloride   Intravenous Once  . aspirin EC  81 mg Oral Daily  .  ceFAZolin (ANCEF) IV  2 g Intravenous [id number]  . folic acid  1 mg Oral Daily  . heparin  5,000 Units Subcutaneous Q8H  . hydroxyurea  1,000 mg Oral Daily  . morphine  15 mg Oral Q12H  . multivitamin with minerals  1 tablet Oral Daily  . potassium chloride  40 mEq Oral Daily   Continuous Infusions: . 0.45 % NaCl with KCl 20 mEq / L 125 mL/hr at 11/17/16 1623     LOS: 6 days    Time spent: 30 minutes    Loretha Stapler, MD [facility] Pager [phone]  If 7PM-7AM, please contact night-coverage [email or web address] Password [facility] 11/17/2016, 4:55 PM

## 2016-11-18 LAB — BASIC METABOLIC PANEL
ANION GAP: 6 (ref 5–15)
BUN: <5 mg/dL — ABNORMAL LOW (ref 6–20)
CALCIUM: 7.3 mg/dL — AB (ref 8.9–10.3)
CO2: 21 mmol/L — ABNORMAL LOW (ref 22–32)
Chloride: 113 mmol/L — ABNORMAL HIGH (ref 101–111)
Creatinine, Ser: 0.47 mg/dL (ref 0.44–1.00)
GLUCOSE: 106 mg/dL — AB (ref 65–99)
Potassium: 2.9 mmol/L — ABNORMAL LOW (ref 3.5–5.1)
Sodium: 140 mmol/L (ref 135–145)

## 2016-11-18 LAB — COMPREHENSIVE METABOLIC PANEL
ALT: 20 U/L (ref 14–54)
ANION GAP: 2 — AB (ref 5–15)
AST: 92 U/L — ABNORMAL HIGH (ref 15–41)
Albumin: 1.9 g/dL — ABNORMAL LOW (ref 3.5–5.0)
Alkaline Phosphatase: 111 U/L (ref 38–126)
BILIRUBIN TOTAL: 7.6 mg/dL — AB (ref 0.3–1.2)
BUN: <5 mg/dL — ABNORMAL LOW (ref 6–20)
CALCIUM: 7 mg/dL — AB (ref 8.9–10.3)
CO2: 23 mmol/L (ref 22–32)
Chloride: 102 mmol/L (ref 101–111)
Creatinine, Ser: 0.4 mg/dL — ABNORMAL LOW (ref 0.44–1.00)
GFR calc non Af Amer: >60 mL/min (ref >60)
Glucose, Bld: 101 mg/dL — ABNORMAL HIGH (ref 65–99)
Potassium: 6.5 mmol/L (ref 3.5–5.1)
Sodium: 127 mmol/L — ABNORMAL LOW (ref 135–145)
TOTAL PROTEIN: 6.1 g/dL — AB (ref 6.5–8.1)

## 2016-11-18 LAB — CBC WITH DIFFERENTIAL/PLATELET
BAND NEUTROPHILS: 0 %
BLASTS: 0 %
Basophils Absolute: 0 10*3/uL (ref 0.0–0.1)
Basophils Relative: 0 %
Eosinophils Absolute: 0.2 10*3/uL (ref 0.0–0.7)
Eosinophils Relative: 3 %
HEMATOCRIT: 17.4 % — AB (ref 36.0–46.0)
HEMOGLOBIN: 6.3 g/dL — AB (ref 12.0–15.0)
Lymphocytes Relative: 38 %
Lymphs Abs: 2.7 10*3/uL (ref 0.7–4.0)
MCH: 36 pg — AB (ref 26.0–34.0)
MCHC: 36.2 g/dL — AB (ref 30.0–36.0)
MCV: 99.4 fL (ref 78.0–100.0)
MONOS PCT: 14 %
Metamyelocytes Relative: 0 %
Monocytes Absolute: 1 10*3/uL (ref 0.1–1.0)
Myelocytes: 0 %
NEUTROS ABS: 3.2 10*3/uL (ref 1.7–7.7)
NEUTROS PCT: 45 %
NRBC: 0 /100{WBCs}
Other: 0 %
Platelets: 221 10*3/uL (ref 150–400)
Promyelocytes Absolute: 0 %
RBC: 1.75 MIL/uL — AB (ref 3.87–5.11)
RDW: 21.5 % — ABNORMAL HIGH (ref 11.5–15.5)
WBC: 7.1 10*3/uL (ref 4.0–10.5)

## 2016-11-18 LAB — PREPARE RBC (CROSSMATCH)

## 2016-11-18 LAB — HEMOGLOBIN AND HEMATOCRIT, BLOOD
HCT: 18.7 % — ABNORMAL LOW (ref 36.0–46.0)
Hemoglobin: 6.9 g/dL — CL (ref 12.0–15.0)

## 2016-11-18 MED ORDER — SODIUM CHLORIDE 0.9 % IV BOLUS (SEPSIS)
500.0000 mL | Freq: Once | INTRAVENOUS | Status: AC
  Administered 2016-11-18: 500 mL via INTRAVENOUS

## 2016-11-18 MED ORDER — ALBUTEROL SULFATE (2.5 MG/3ML) 0.083% IN NEBU
10.0000 mg | INHALATION_SOLUTION | Freq: Once | RESPIRATORY_TRACT | Status: AC
  Administered 2016-11-18: 10 mg via RESPIRATORY_TRACT
  Filled 2016-11-18: qty 12

## 2016-11-18 MED ORDER — DIPHENHYDRAMINE HCL 50 MG/ML IJ SOLN
25.0000 mg | Freq: Four times a day (QID) | INTRAMUSCULAR | Status: DC | PRN
  Administered 2016-11-18 – 2016-11-20 (×6): 25 mg via INTRAVENOUS
  Filled 2016-11-18 (×6): qty 1

## 2016-11-18 MED ORDER — POTASSIUM CHLORIDE CRYS ER 20 MEQ PO TBCR
40.0000 meq | EXTENDED_RELEASE_TABLET | Freq: Three times a day (TID) | ORAL | Status: DC
  Administered 2016-11-18 – 2016-11-20 (×7): 40 meq via ORAL
  Filled 2016-11-18 (×7): qty 2

## 2016-11-18 MED ORDER — POTASSIUM CHLORIDE IN NACL 20-0.45 MEQ/L-% IV SOLN
INTRAVENOUS | Status: AC
  Filled 2016-11-18: qty 1000

## 2016-11-18 MED ORDER — SODIUM CHLORIDE 0.9 % IV SOLN: Freq: Once | INTRAVENOUS | Status: DC

## 2016-11-18 MED ORDER — SODIUM CHLORIDE 0.9 % IV SOLN
INTRAVENOUS | Status: DC
  Administered 2016-11-18: 12:00:00 via INTRAVENOUS

## 2016-11-18 MED ORDER — SODIUM CHLORIDE 0.45 % IV SOLN
INTRAVENOUS | Status: DC
  Administered 2016-11-19: 05:00:00 via INTRAVENOUS

## 2016-11-18 NOTE — Progress Notes (Signed)
PROGRESS NOTE    Dawn [name]  [id number] DOB: 03/13/1981 DOA: 11/11/2016 PCP: Angelica Chessman, MD    Brief Narrative:  Patient is a 35 HbSS admitted for sickle cell crisis, found to have bacteremia with 2/2 Staph Aureus (MSSA), likely line infection. Appreciate Dr Storm Frisk recommendation. She had her port removed on 2/21.  She was transfused 1 unit of PRBC bringing her Hb from 6.2 g to 7 g on 2/21; her total bili improved to 12 from 18 as well.   She is now on Ancef 2g IV q 8.    She was monitored and her H/H decreased again on 2/25 and therefore an additional unit of pRBC's was ordered.  Potassium was 6.5 on 2/25 and hyperkalemia treatment was initiated.   Assessment & Plan:   Principal Problem:   Sickle cell crisis (HCC) Active Problems:   Anemia of chronic disease   Pulmonary hypertension   Line sepsis - Port removed Feb 21/18 - Will order PICC placement ordered - Will need 4 weeks of IV antibiotics - Cardiology saw patient and discussed getting TTE but state patient more appropriate for TEE and repeat TEE after antibiotic completion - will need repeat TEE at end of antibiotic course - TTE not showing any vegetations - continue Ancef - second round of blood cultures showing no growth  Sickle cell crisis - One unit pRBC given - H/H decreased this am - repeat transfusion ordered - Continue with IVF  - Continue with IV dilaudid but increase time interval between dosing - continue IV diluadid - pre transfusion H/H and repeat CBC after transfusion am  Hyperkalemia - will stop IVF with potassium - repeat BMP in am - giving NS bolus followed by 111ml/hr   DVT prophylaxis: SubQ heparin Code Status: Full code Family Communication: no family bedside Disposition Plan: pending resolution of sickle cell crisis   Consultants:   Surgery  Cardiology  ID (via telephone)  Procedures:   Echocardiogram  Antimicrobials:   Ancef    Subjective: Patient seen.   Says her pain is relatively well controlled if she gets her diluadid close to times allowed. Asking questions about elevated potassium.  Objective: Vitals:   11/17/16 0500 11/17/16 1601 11/17/16 2215 11/18/16 0615  BP: 119/66 (!) 142/76 131/69 (!) 118/55  Pulse: 89 87 91 95  Resp: 20 16 16 18   Temp: 98.1 F (36.7 C) 98.6 F (37 C) 98.8 F (37.1 C) 98.7 F (37.1 C)  TempSrc: Oral Oral Oral Oral  SpO2: 100% 95% 96% 95%  Weight:      Height:        Intake/Output Summary (Last 24 hours) at 11/18/16 1019 Last data filed at 11/18/16 0800  Gross per 24 hour  Intake          2319.58 ml  Output                0 ml  Net          2319.58 ml   Filed Weights   11/11/16 0818 11/11/16 1533  Weight: 73.9 kg (163 lb) 73.9 kg (163 lb)    Examination:  General exam: Appears calm and comfortable  Respiratory system: Clear to auscultation. Respiratory effort normal. Cardiovascular system: S1 & S2 heard, RRR. No JVD, murmurs, rubs, gallops or clicks. No pedal edema. Gastrointestinal system: Abdomen is nondistended, soft and nontender. No organomegaly or masses felt. Normal bowel sounds heard. Central nervous system: Alert and oriented. No focal neurological deficits. Extremities: Symmetric 5  x 5 power Skin: No rashes, lesions or ulcers Psychiatry: Judgement and insight appear normal. Mood & affect appropriate.     Data Reviewed: I have personally reviewed following labs and imaging studies  CBC:  Recent Labs Lab 11/12/16 0639 11/13/16 0621 11/14/16 0643 11/16/16 0906 11/18/16 0849  WBC 12.1* 9.7 9.3 11.1* 7.1  NEUTROABS  --  6.3 5.0 6.2 PENDING  HGB 6.7* 6.2* 7.0* 7.4* 6.3*  HCT 17.9* 16.8* 19.5* 20.3* 17.4*  MCV 95.7 96.0 96.5 98.5 99.4  PLT 99* 99* 132* 206 [id number]   Basic Metabolic Panel:  Recent Labs Lab 11/12/16 0639 11/13/16 0621 11/14/16 0643 11/16/16 0906 11/18/16 0849  NA 132* 132* 133* 135 127*  K 3.1* 2.8* 3.3* 3.8 6.5*  CL 104 104 105 107 102  CO2 22 23 23  23 23   GLUCOSE 125* 128* 88 101* 101*  BUN 6 5* <5* 5* <5*  CREATININE 0.47 0.38* 0.45 0.48 0.40*  CALCIUM 7.7* 7.7* 7.9* 8.3* 7.0*   GFR: Estimated Creatinine Clearance: 94.5 mL/min (by C-G formula based on SCr of 0.4 mg/dL (L)). Liver Function Tests:  Recent Labs Lab 11/13/16 0621 11/14/16 0643 11/16/16 0906 11/18/16 0849  AST 94* 89* 90* 92*  ALT 31 29 25 20   ALKPHOS 83 92 122 111  BILITOT 14.9* 12.8* 11.4* 7.6*  PROT 6.4* 6.5 6.9 6.1*  ALBUMIN 2.0* 2.1* 2.2* 1.9*   No results for input(s): LIPASE, AMYLASE in the last 168 hours. No results for input(s): AMMONIA in the last 168 hours. Coagulation Profile: No results for input(s): INR, PROTIME in the last 168 hours. Cardiac Enzymes: No results for input(s): CKTOTAL, CKMB, CKMBINDEX, TROPONINI in the last 168 hours. BNP (last 3 results) No results for input(s): PROBNP in the last 8760 hours. HbA1C: No results for input(s): HGBA1C in the last 72 hours. CBG: No results for input(s): GLUCAP in the last 168 hours. Lipid Profile: No results for input(s): CHOL, HDL, LDLCALC, TRIG, CHOLHDL, LDLDIRECT in the last 72 hours. Thyroid Function Tests: No results for input(s): TSH, T4TOTAL, FREET4, T3FREE, THYROIDAB in the last 72 hours. Anemia Panel: No results for input(s): VITAMINB12, FOLATE, FERRITIN, TIBC, IRON, RETICCTPCT in the last 72 hours. Sepsis Labs: No results for input(s): PROCALCITON, LATICACIDVEN in the last 168 hours.  Recent Results (from the past 240 hour(s))  Culture, blood (Routine X 2) w Reflex to ID Panel     Status: Abnormal   Collection Time: 11/11/16  6:43 PM  Result Value Ref Range Status   Specimen Description BLOOD RIGHT HAND  Final   Special Requests BOTTLES DRAWN AEROBIC AND ANAEROBIC [facility] EACH  Final   Culture  Setup Time   Final    GRAM POSITIVE COCCI IN CLUSTERS Gram Stain Report Called to,Read Back By and Verified With: MAYS J. AT 1115A ON [id number] BY THOMPSON S. IN BOTH AEROBIC AND ANAEROBIC  BOTTLES CRITICAL RESULT CALLED TO, READ BACK BY AND VERIFIED WITH: S. Hall Pharm.D. 16:35 11/12/16 ([email or web address])    Culture (A)  Final    STAPHYLOCOCCUS AUREUS SUSCEPTIBILITIES PERFORMED ON PREVIOUS CULTURE WITHIN THE LAST 5 DAYS. Performed at [facility], [place] [address], [place], [place] [address]    Report Status 11/14/2016 FINAL  Final  Culture, blood (Routine X 2) w Reflex to ID Panel     Status: Abnormal   Collection Time: 11/11/16  6:45 PM  Result Value Ref Range Status   Specimen Description BLOOD LEFT HAND  Final   Special Requests BOTTLES DRAWN  AEROBIC AND ANAEROBIC [facility] EACH  Final   Culture  Setup Time   Final    GRAM POSITIVE COCCI IN CLUSTERS Gram Stain Report Called to,Read Back By and Verified With: MAYS J. AT 1115A ON [id number] BY THOMPSON S. IN BOTH AEROBIC AND ANAEROBIC BOTTLES Organism ID to follow CRITICAL RESULT CALLED TO, READ BACK BY AND VERIFIED WITH: S. Hall Pharm.D. 16:35 11/12/16 ([email or web address]) Performed at [facility], [place] [address] [place], [place] [address]    Culture STAPHYLOCOCCUS AUREUS (A)  Final   Report Status 11/14/2016 FINAL  Final   Organism ID, Bacteria STAPHYLOCOCCUS AUREUS  Final      Susceptibility   Staphylococcus aureus - MIC*    CIPROFLOXACIN <=0.5 SENSITIVE Sensitive     ERYTHROMYCIN <=0.25 SENSITIVE Sensitive     GENTAMICIN <=0.5 SENSITIVE Sensitive     OXACILLIN <=0.25 SENSITIVE Sensitive     TETRACYCLINE <=1 SENSITIVE Sensitive     VANCOMYCIN 1 SENSITIVE Sensitive     TRIMETH/SULFA <=10 SENSITIVE Sensitive     CLINDAMYCIN <=0.25 SENSITIVE Sensitive     RIFAMPIN <=0.5 SENSITIVE Sensitive     Inducible Clindamycin NEGATIVE Sensitive     * STAPHYLOCOCCUS AUREUS  Blood Culture ID Panel (Reflexed)     Status: Abnormal   Collection Time: 11/11/16  6:45 PM  Result Value Ref Range Status   Enterococcus species NOT DETECTED NOT DETECTED Final   Listeria monocytogenes NOT DETECTED NOT DETECTED Final   Staphylococcus species DETECTED  (A) NOT DETECTED Final    Comment: CRITICAL RESULT CALLED TO, READ BACK BY AND VERIFIED WITH: S. Hall Pharm.D. 16:35 11/12/16 ([email or web address])    Staphylococcus aureus DETECTED (A) NOT DETECTED Final    Comment: Methicillin (oxacillin) susceptible Staphylococcus aureus (MSSA). Preferred therapy is anti staphylococcal beta lactam antibiotic (Cefazolin or Nafcillin), unless clinically contraindicated. CRITICAL RESULT CALLED TO, READ BACK BY AND VERIFIED WITH: S. Hall Pharm.D. 16:35 11/12/16 (wilsonm)    Methicillin resistance NOT DETECTED NOT DETECTED Final   Streptococcus species NOT DETECTED NOT DETECTED Final   Streptococcus agalactiae NOT DETECTED NOT DETECTED Final   Streptococcus pneumoniae NOT DETECTED NOT DETECTED Final   Streptococcus pyogenes NOT DETECTED NOT DETECTED Final   Acinetobacter baumannii NOT DETECTED NOT DETECTED Final   Enterobacteriaceae species NOT DETECTED NOT DETECTED Final   Enterobacter cloacae complex NOT DETECTED NOT DETECTED Final   Escherichia coli NOT DETECTED NOT DETECTED Final   Klebsiella oxytoca NOT DETECTED NOT DETECTED Final   Klebsiella pneumoniae NOT DETECTED NOT DETECTED Final   Proteus species NOT DETECTED NOT DETECTED Final   Serratia marcescens NOT DETECTED NOT DETECTED Final   Haemophilus influenzae NOT DETECTED NOT DETECTED Final   Neisseria meningitidis NOT DETECTED NOT DETECTED Final   Pseudomonas aeruginosa NOT DETECTED NOT DETECTED Final   Candida albicans NOT DETECTED NOT DETECTED Final   Candida glabrata NOT DETECTED NOT DETECTED Final   Candida krusei NOT DETECTED NOT DETECTED Final   Candida parapsilosis NOT DETECTED NOT DETECTED Final   Candida tropicalis NOT DETECTED NOT DETECTED Final    Comment: Performed at [facility], [address], [place], [place] [address]  Culture, blood (Routine X 2) w Reflex to ID Panel     Status: Abnormal   Collection Time: 11/12/16  8:22 AM  Result Value Ref Range Status   Specimen Description  PORTA CATH DRAWN BY RN  Final   Special Requests BOTTLES DRAWN AEROBIC AND ANAEROBIC [facility]  Final   Culture  Setup Time  Final    GRAM POSITIVE COCCI Gram Stain Report Called to,Read Back By and Verified With: THOMAS,C. AT 1932 ON 11/12/16 BY EVA IN BOTH AEROBIC AND ANAEROBIC BOTTLES CRITICAL VALUE NOTED.  VALUE IS CONSISTENT WITH PREVIOUSLY REPORTED AND CALLED VALUE.    Culture (A)  Final    STAPHYLOCOCCUS AUREUS SUSCEPTIBILITIES PERFORMED ON PREVIOUS CULTURE WITHIN THE LAST 5 DAYS. Performed at [facility], [place] [address], [place], [place] [address]    Report Status 11/14/2016 FINAL  Final  Culture, blood (Routine X 2) w Reflex to ID Panel     Status: Abnormal   Collection Time: 11/12/16  8:44 AM  Result Value Ref Range Status   Specimen Description PORTA CATH DRAWN BY RN  Final   Special Requests BOTTLES DRAWN AEROBIC AND ANAEROBIC 8CC  Final   Culture  Setup Time   Final    GRAM POSITIVE COCCI IN CLUSTERS Gram Stain Report Called to,Read Back By and Verified With: MAYS J. AT 1309 ON [id number] BY THOMPSON S.    Culture (A)  Final    STAPHYLOCOCCUS AUREUS SUSCEPTIBILITIES PERFORMED ON PREVIOUS CULTURE WITHIN THE LAST 5 DAYS. Performed at [facility], [place] [address], [place], [place] [address]    Report Status 11/15/2016 FINAL  Final  Culture, blood (Routine X 2) w Reflex to ID Panel     Status: None (Preliminary result)   Collection Time: 11/13/16 11:05 AM  Result Value Ref Range Status   Specimen Description BLOOD LEFT HAND  Final   Special Requests BOTTLES DRAWN AEROBIC ONLY 6CC  Final   Culture NO GROWTH 4 DAYS  Final   Report Status PENDING  Incomplete  Surgical pcr screen     Status: None   Collection Time: 11/13/16  7:38 PM  Result Value Ref Range Status   MRSA, PCR NEGATIVE NEGATIVE Final   Staphylococcus aureus NEGATIVE NEGATIVE Final    Comment:        The Xpert SA Assay (FDA approved for NASAL specimens in patients over 48 years of age), is one  component of a comprehensive surveillance program.  Test performance has been validated by [facility] for patients greater than or equal to 47 year old. It is not intended to diagnose infection nor to guide or monitor treatment.   Cath Tip Culture     Status: None   Collection Time: 11/14/16  9:03 AM  Result Value Ref Range Status   Specimen Description CATH TIP  Final   Special Requests NONE  Final   Culture   Final    NO GROWTH 3 DAYS Performed at [facility], [address], [place], [place] [address]    Report Status 11/17/2016 FINAL  Final         Radiology Studies: No results found.      Scheduled Meds: . sodium chloride   Intravenous Once  . albuterol  10 mg Nebulization Once  . aspirin EC  81 mg Oral Daily  .  ceFAZolin (ANCEF) IV  2 g Intravenous [id number]  . folic acid  1 mg Oral Daily  . heparin  5,000 Units Subcutaneous Q8H  . hydroxyurea  1,000 mg Oral Daily  . morphine  15 mg Oral Q12H  . multivitamin with minerals  1 tablet Oral Daily  . sodium chloride  500 mL Intravenous Once   Continuous Infusions: . sodium chloride       LOS: 7 days    Time spent: 30 minutes  Loretha Stapler, MD [facility] Pager [phone]  If 7PM-7AM, please contact night-coverage [email or web address] Password [facility] 11/18/2016, 10:19 AM

## 2016-11-18 NOTE — Progress Notes (Signed)
CRITICAL VALUE ALERT  Critical value received:  Hgb 6.9  Date of notification:  11/18/16  Time of notification:  1300  Critical value read back:Yes.    Nurse who received alert:  Sharyn Blitz, RN  MD notified (1st page):  Dr. Adair Patter  Time of first page:  1400  MD notified (2nd page):  Time of second page:  Responding MD:  Dr. Adair Patter  Time MD responded:  [phone]

## 2016-11-18 NOTE — Progress Notes (Signed)
Received a call from lab that pt blood tested (+) for antibodies. Being sent off to [facility], and will not be able to receive blood soon. Notified RN taking care of pt.

## 2016-11-18 NOTE — Progress Notes (Signed)
  CRITICAL VALUE ALERT  Critical value received:  Potassium 6.5  Date of notification:  11/18/16  Time of notification:  0947  Critical value read back:Yes.    Nurse who received alert:  Sharyn Blitz, RN  MD notified (1st page):  Dr. Adair Patter  Time of first page:  [phone]  MD notified (2nd page):  Time of second page:  Responding MD:    Time MD responded:

## 2016-11-18 NOTE — Progress Notes (Signed)
CRITICAL VALUE ALERT  Critical value received:  Hgb 6.3  Date of notification:  11/18/16  Time of notification:  [id number]  Critical value read back:Yes.    Nurse who received alert:  Sharyn Blitz, RN  MD notified (1st page):  Dr. Adair Patter  Time of first page:  69  MD notified (2nd page):  Time of second page:  Responding MD:    Time MD responded:

## 2016-11-19 LAB — CBC WITH DIFFERENTIAL/PLATELET
BASOS PCT: 1 %
Basophils Absolute: 0.1 10*3/uL (ref 0.0–0.1)
EOS PCT: 3 %
Eosinophils Absolute: 0.2 10*3/uL (ref 0.0–0.7)
HCT: 23.1 % — ABNORMAL LOW (ref 36.0–46.0)
HEMOGLOBIN: 8.3 g/dL — AB (ref 12.0–15.0)
LYMPHS PCT: 37 %
Lymphs Abs: 2.7 10*3/uL (ref 0.7–4.0)
MCH: 35 pg — AB (ref 26.0–34.0)
MCHC: 35.9 g/dL (ref 30.0–36.0)
MCV: 97.5 fL (ref 78.0–100.0)
Monocytes Absolute: 1 10*3/uL (ref 0.1–1.0)
Monocytes Relative: 14 %
NEUTROS PCT: 45 %
Neutro Abs: 3.4 10*3/uL (ref 1.7–7.7)
Platelets: 278 10*3/uL (ref 150–400)
RBC: 2.37 MIL/uL — AB (ref 3.87–5.11)
RDW: 25 % — ABNORMAL HIGH (ref 11.5–15.5)
WBC: 7.4 10*3/uL (ref 4.0–10.5)

## 2016-11-19 LAB — TYPE AND SCREEN
BLOOD PRODUCT EXPIRATION DATE: 201803192359
Blood Product Expiration Date: 201803222359
ISSUE DATE / TIME: 201802260949
ISSUE DATE / TIME: 201802210143
UNIT TYPE AND RH: 600
Unit Type and Rh: 6200

## 2016-11-19 LAB — CULTURE, BLOOD (ROUTINE X 2): CULTURE: NO GROWTH

## 2016-11-19 LAB — BASIC METABOLIC PANEL
ANION GAP: 4 — AB (ref 5–15)
BUN: <5 mg/dL — ABNORMAL LOW (ref 6–20)
CHLORIDE: 104 mmol/L (ref 101–111)
CO2: 26 mmol/L (ref 22–32)
Calcium: 8.3 mg/dL — ABNORMAL LOW (ref 8.9–10.3)
Creatinine, Ser: 0.5 mg/dL (ref 0.44–1.00)
GFR calc Af Amer: >60 mL/min (ref >60)
GFR calc non Af Amer: >60 mL/min (ref >60)
Glucose, Bld: 76 mg/dL (ref 65–99)
POTASSIUM: 3.8 mmol/L (ref 3.5–5.1)
Sodium: 134 mmol/L — ABNORMAL LOW (ref 135–145)

## 2016-11-19 NOTE — Progress Notes (Signed)
PROGRESS NOTE    PERLENE SIGLEY  [id number] DOB: 08-Jul-1981 DOA: 11/11/2016 PCP: Angelica Chessman, MD    Brief Narrative:  Patient is a 36 HbSS admitted for sickle cell crisis, found to have bacteremia with 2/2 Staph Aureus (MSSA), likely line infection. Appreciate Dr Storm Frisk recommendation. She had her port removed on 2/21.  She was transfused 1 unit of PRBC bringing her Hb from 6.2 g to 7 g on 2/21; her total bili improved to 12 from 18 as well.   She is now on Ancef 2g IV q 8.    She was monitored and her H/H decreased again on 2/25 and therefore an additional unit of pRBC's was ordered.  Potassium was 6.5 on 2/25 and hyperkalemia treatment was initiated.   Assessment & Plan:   Principal Problem:   Sickle cell crisis (HCC) Active Problems:   Anemia of chronic disease   Pulmonary hypertension   Line sepsis - Port removed Feb 21/18 - Will order PICC placement ordered - Will need 4 weeks of IV antibiotics - Cardiology saw patient and discussed getting TTE but state patient more appropriate for TEE and repeat TEE after antibiotic completion - will need repeat TEE at end of antibiotic course - TTE not showing any vegetations - continue Ancef - second round of blood cultures showing no growth  Sickle cell crisis - Two units of pRBC give - H/H decreased yesterday am - Continue with IVF  - Continue with IV dilaudid but increase time interval between dosing - continue IV diluadid - repeat CBC after transfusion  Hyperkalemia - resolved - monitor BMP qAM   DVT prophylaxis: SubQ heparin Code Status: Full code Family Communication: no family bedside Disposition Plan: pending resolution of sickle cell crisis   Consultants:   Surgery  Cardiology  ID (via telephone)  Procedures:   Echocardiogram  Antimicrobials:   Ancef    Subjective: Patient reporting pain is appropriately managed with diluadid at q2 interval.  Reports she slept well.  Blood transfusion  just finished this am.  Objective: Vitals:   11/19/16 1007 11/19/16 1022 11/19/16 1240 11/19/16 1419  BP: (!) 141/71 (!) 145/72 132/69 (!) 155/90  Pulse: 98 (!) 110 92 89  Resp: 18 17 18 18   Temp: 98.9 F (37.2 C) 98.4 F (36.9 C) 98.5 F (36.9 C) 98.7 F (37.1 C)  TempSrc: Oral Oral Oral Oral  SpO2:  100% 100% 98%  Weight:      Height:        Intake/Output Summary (Last 24 hours) at 11/19/16 1639 Last data filed at 11/19/16 1255  Gross per 24 hour  Intake           1242.5 ml  Output                0 ml  Net           1242.5 ml   Filed Weights   11/11/16 0818 11/11/16 1533  Weight: 73.9 kg (163 lb) 73.9 kg (163 lb)    Examination:  General exam: Appears calm and comfortable  Respiratory system: Clear to auscultation. Respiratory effort normal. Cardiovascular system: S1 & S2 heard, RRR. No JVD, murmurs, rubs, gallops or clicks. No pedal edema. Gastrointestinal system: Abdomen is nondistended, soft and nontender. No organomegaly or masses felt. Normal bowel sounds heard. Central nervous system: Alert and oriented. No focal neurological deficits. Extremities: Symmetric 5 x 5 power, no joint swelling Skin: No rashes, lesions or ulcers Psychiatry: Judgement and insight appear  normal. Mood & affect appropriate.     Data Reviewed: I have personally reviewed following labs and imaging studies  CBC:  Recent Labs Lab 11/13/16 0621 11/14/16 0643 11/16/16 0906 11/18/16 0849 11/18/16 1229  WBC 9.7 9.3 11.1* 7.1  --   NEUTROABS 6.3 5.0 6.2 3.2  --   HGB 6.2* 7.0* 7.4* 6.3* 6.9*  HCT 16.8* 19.5* 20.3* 17.4* 18.7*  MCV 96.0 96.5 98.5 99.4  --   PLT 99* 132* 206 221  --    Basic Metabolic Panel:  Recent Labs Lab 11/14/16 0643 11/16/16 0906 11/18/16 0849 11/18/16 1229 11/19/16 0822  NA 133* 135 127* 140 134*  K 3.3* 3.8 6.5* 2.9* 3.8  CL 105 107 102 113* 104  CO2 23 23 23  21* 26  GLUCOSE 88 101* 101* 106* 76  BUN <5* 5* <5* <5* <5*  CREATININE 0.45 0.48  0.40* 0.47 0.50  CALCIUM 7.9* 8.3* 7.0* 7.3* 8.3*   GFR: Estimated Creatinine Clearance: 94.5 mL/min (by C-G formula based on SCr of 0.5 mg/dL). Liver Function Tests:  Recent Labs Lab 11/13/16 0621 11/14/16 0643 11/16/16 0906 11/18/16 0849  AST 94* 89* 90* 92*  ALT 31 29 25 20   ALKPHOS 83 92 122 111  BILITOT 14.9* 12.8* 11.4* 7.6*  PROT 6.4* 6.5 6.9 6.1*  ALBUMIN 2.0* 2.1* 2.2* 1.9*   No results for input(s): LIPASE, AMYLASE in the last 168 hours. No results for input(s): AMMONIA in the last 168 hours. Coagulation Profile: No results for input(s): INR, PROTIME in the last 168 hours. Cardiac Enzymes: No results for input(s): CKTOTAL, CKMB, CKMBINDEX, TROPONINI in the last 168 hours. BNP (last 3 results) No results for input(s): PROBNP in the last 8760 hours. HbA1C: No results for input(s): HGBA1C in the last 72 hours. CBG: No results for input(s): GLUCAP in the last 168 hours. Lipid Profile: No results for input(s): CHOL, HDL, LDLCALC, TRIG, CHOLHDL, LDLDIRECT in the last 72 hours. Thyroid Function Tests: No results for input(s): TSH, T4TOTAL, FREET4, T3FREE, THYROIDAB in the last 72 hours. Anemia Panel: No results for input(s): VITAMINB12, FOLATE, FERRITIN, TIBC, IRON, RETICCTPCT in the last 72 hours. Sepsis Labs: No results for input(s): PROCALCITON, LATICACIDVEN in the last 168 hours.  Recent Results (from the past 240 hour(s))  Culture, blood (Routine X 2) w Reflex to ID Panel     Status: Abnormal   Collection Time: 11/11/16  6:43 PM  Result Value Ref Range Status   Specimen Description BLOOD RIGHT HAND  Final   Special Requests BOTTLES DRAWN AEROBIC AND ANAEROBIC [facility] EACH  Final   Culture  Setup Time   Final    GRAM POSITIVE COCCI IN CLUSTERS Gram Stain Report Called to,Read Back By and Verified With: MAYS J. AT 1115A ON [id number] BY THOMPSON S. IN BOTH AEROBIC AND ANAEROBIC BOTTLES CRITICAL RESULT CALLED TO, READ BACK BY AND VERIFIED WITH: S. Hall Pharm.D. 16:35  11/12/16 ([email or web address])    Culture (A)  Final    STAPHYLOCOCCUS AUREUS SUSCEPTIBILITIES PERFORMED ON PREVIOUS CULTURE WITHIN THE LAST 5 DAYS. Performed at [facility], [place] [address] [place], [place] [address]    Report Status 11/14/2016 FINAL  Final  Culture, blood (Routine X 2) w Reflex to ID Panel     Status: Abnormal   Collection Time: 11/11/16  6:45 PM  Result Value Ref Range Status   Specimen Description BLOOD LEFT HAND  Final   Special Requests BOTTLES DRAWN AEROBIC AND ANAEROBIC [place]  Final  Culture  Setup Time   Final    GRAM POSITIVE COCCI IN CLUSTERS Gram Stain Report Called to,Read Back By and Verified With: MAYS J. AT 1115A ON QB:[id number] BY THOMPSON S. IN BOTH AEROBIC AND ANAEROBIC BOTTLES Organism ID to follow CRITICAL RESULT CALLED TO, READ BACK BY AND VERIFIED WITH: S. Hall Pharm.D. 16:35 11/12/16 ([email or web address]) Performed at [facility], [place] [address] [place], [place] [address]    Culture STAPHYLOCOCCUS AUREUS (A)  Final   Report Status 11/14/2016 FINAL  Final   Organism ID, Bacteria STAPHYLOCOCCUS AUREUS  Final      Susceptibility   Staphylococcus aureus - MIC*    CIPROFLOXACIN <=0.5 SENSITIVE Sensitive     ERYTHROMYCIN <=0.25 SENSITIVE Sensitive     GENTAMICIN <=0.5 SENSITIVE Sensitive     OXACILLIN <=0.25 SENSITIVE Sensitive     TETRACYCLINE <=1 SENSITIVE Sensitive     VANCOMYCIN 1 SENSITIVE Sensitive     TRIMETH/SULFA <=10 SENSITIVE Sensitive     CLINDAMYCIN <=0.25 SENSITIVE Sensitive     RIFAMPIN <=0.5 SENSITIVE Sensitive     Inducible Clindamycin NEGATIVE Sensitive     * STAPHYLOCOCCUS AUREUS  Blood Culture ID Panel (Reflexed)     Status: Abnormal   Collection Time: 11/11/16  6:45 PM  Result Value Ref Range Status   Enterococcus species NOT DETECTED NOT DETECTED Final   Listeria monocytogenes NOT DETECTED NOT DETECTED Final   Staphylococcus species DETECTED (A) NOT DETECTED Final    Comment: CRITICAL RESULT CALLED TO, READ BACK BY AND VERIFIED  WITH: S. Hall Pharm.D. 16:35 11/12/16 (wilsonm)    Staphylococcus aureus DETECTED (A) NOT DETECTED Final    Comment: Methicillin (oxacillin) susceptible Staphylococcus aureus (MSSA). Preferred therapy is anti staphylococcal beta lactam antibiotic (Cefazolin or Nafcillin), unless clinically contraindicated. CRITICAL RESULT CALLED TO, READ BACK BY AND VERIFIED WITH: S. Hall Pharm.D. 16:35 11/12/16 (wilsonm)    Methicillin resistance NOT DETECTED NOT DETECTED Final   Streptococcus species NOT DETECTED NOT DETECTED Final   Streptococcus agalactiae NOT DETECTED NOT DETECTED Final   Streptococcus pneumoniae NOT DETECTED NOT DETECTED Final   Streptococcus pyogenes NOT DETECTED NOT DETECTED Final   Acinetobacter baumannii NOT DETECTED NOT DETECTED Final   Enterobacteriaceae species NOT DETECTED NOT DETECTED Final   Enterobacter cloacae complex NOT DETECTED NOT DETECTED Final   Escherichia coli NOT DETECTED NOT DETECTED Final   Klebsiella oxytoca NOT DETECTED NOT DETECTED Final   Klebsiella pneumoniae NOT DETECTED NOT DETECTED Final   Proteus species NOT DETECTED NOT DETECTED Final   Serratia marcescens NOT DETECTED NOT DETECTED Final   Haemophilus influenzae NOT DETECTED NOT DETECTED Final   Neisseria meningitidis NOT DETECTED NOT DETECTED Final   Pseudomonas aeruginosa NOT DETECTED NOT DETECTED Final   Candida albicans NOT DETECTED NOT DETECTED Final   Candida glabrata NOT DETECTED NOT DETECTED Final   Candida krusei NOT DETECTED NOT DETECTED Final   Candida parapsilosis NOT DETECTED NOT DETECTED Final   Candida tropicalis NOT DETECTED NOT DETECTED Final    Comment: Performed at [facility], [address] [place], [place] [address]  Culture, blood (Routine X 2) w Reflex to ID Panel     Status: Abnormal   Collection Time: 11/12/16  8:22 AM  Result Value Ref Range Status   Specimen Description PORTA CATH DRAWN BY RN  Final   Special Requests BOTTLES DRAWN AEROBIC AND ANAEROBIC 6CC   Final   Culture  Setup Time   Final    GRAM POSITIVE COCCI Gram  Stain Report Called to,Read Back By and Verified With: [place] ON 11/12/16 BY EVA IN BOTH AEROBIC AND ANAEROBIC BOTTLES CRITICAL VALUE NOTED.  VALUE IS CONSISTENT WITH PREVIOUSLY REPORTED AND CALLED VALUE.    Culture (A)  Final    STAPHYLOCOCCUS AUREUS SUSCEPTIBILITIES PERFORMED ON PREVIOUS CULTURE WITHIN THE LAST 5 DAYS. Performed at [facility], [place] [address] [place], [place] [address]    Report Status 11/14/2016 FINAL  Final  Culture, blood (Routine X 2) w Reflex to ID Panel     Status: Abnormal   Collection Time: 11/12/16  8:44 AM  Result Value Ref Range Status   Specimen Description PORTA CATH DRAWN BY RN  Final   Special Requests BOTTLES DRAWN AEROBIC AND ANAEROBIC 8CC  Final   Culture  Setup Time   Final    GRAM POSITIVE COCCI IN CLUSTERS Gram Stain Report Called to,Read Back By and Verified With: MAYS J. AT 1309 ON [id number] BY THOMPSON S.    Culture (A)  Final    STAPHYLOCOCCUS AUREUS SUSCEPTIBILITIES PERFORMED ON PREVIOUS CULTURE WITHIN THE LAST 5 DAYS. Performed at [facility], [place] [address], [place], [place] [address]    Report Status 11/15/2016 FINAL  Final  Culture, blood (Routine X 2) w Reflex to ID Panel     Status: None   Collection Time: 11/13/16 11:05 AM  Result Value Ref Range Status   Specimen Description BLOOD LEFT HAND  Final   Special Requests BOTTLES DRAWN AEROBIC ONLY 6CC  Final   Culture NO GROWTH 6 DAYS  Final   Report Status 11/19/2016 FINAL  Final  Surgical pcr screen     Status: None   Collection Time: 11/13/16  7:38 PM  Result Value Ref Range Status   MRSA, PCR NEGATIVE NEGATIVE Final   Staphylococcus aureus NEGATIVE NEGATIVE Final    Comment:        The Xpert SA Assay (FDA approved for NASAL specimens in patients over 54 years of age), is one component of a comprehensive surveillance program.  Test performance has been validated by [facility] for  patients greater than or equal to 87 year old. It is not intended to diagnose infection nor to guide or monitor treatment.   Cath Tip Culture     Status: None   Collection Time: 11/14/16  9:03 AM  Result Value Ref Range Status   Specimen Description CATH TIP  Final   Special Requests NONE  Final   Culture   Final    NO GROWTH 3 DAYS Performed at [facility], [address] [place], [place] [address]    Report Status 11/17/2016 FINAL  Final         Radiology Studies: No results found.      Scheduled Meds: . sodium chloride   Intravenous Once  . sodium chloride   Intravenous Once  . aspirin EC  81 mg Oral Daily  .  ceFAZolin (ANCEF) IV  2 g Intravenous [id number]  . folic acid  1 mg Oral Daily  . heparin  5,000 Units Subcutaneous Q8H  . hydroxyurea  1,000 mg Oral Daily  . morphine  15 mg Oral Q12H  . multivitamin with minerals  1 tablet Oral Daily  . potassium chloride  40 mEq Oral TID   Continuous Infusions: . sodium chloride 75 mL/hr at 11/19/16 0431     LOS: 8 days    Time spent: 30 minutes    Loretha Stapler,  MD [facility] Pager [phone]  If 7PM-7AM, please contact night-coverage [email or web address] Password [facility] 11/19/2016, 4:39 PM

## 2016-11-20 LAB — TYPE AND SCREEN
Blood Product Expiration Date: 201803192359
ISSUE DATE / TIME: 201802260949
UNIT TYPE AND RH: 6200

## 2016-11-20 MED ORDER — POTASSIUM CHLORIDE CRYS ER 20 MEQ PO TBCR: 40.0000 meq | EXTENDED_RELEASE_TABLET | Freq: Three times a day (TID) | ORAL | 0 refills | Status: DC

## 2016-11-20 MED ORDER — CEFAZOLIN SODIUM-DEXTROSE 2-4 GM/100ML-% IV SOLN: 2.0000 g | Freq: Three times a day (TID) | INTRAVENOUS | 0 refills | Status: DC

## 2016-11-20 NOTE — Progress Notes (Signed)
Pt's PICC line clean dry and intact. Discharge instructions including medications and follow up appointments were reviewed and discussed with patient. Pt informed of Home Health RN visit for antibiotic treatments. All questions were answered and no further questions at this time. Pt verbalized understanding of discharge instructions. Pt in stable condition and in no acute distress at time of discharge. Pt escorted by RN.

## 2016-11-20 NOTE — Discharge Summary (Signed)
Physician Discharge Summary  Dawn [name] [id number] DOB: [birth year] DOA: 11/11/2016  PCP: Angelica Chessman, MD  Admit date: 11/11/2016 Discharge date: 11/20/2016  Admitted From: Home Disposition:  Home   Recommendations for Outpatient Follow-up:  1. Follow up with PCP in 1-2 weeks 2. Continue IV antibiotics as prescribed 3. Resume home medications 4. Please obtain BMP/CBC in one week  Home Health: Yes- RN Equipment/Devices: PICC line  Discharge Condition: Stable CODE STATUS: Full Code Diet recommendation: Regular diet  Brief/Interim Summary: Patient is a 76 HbSS admitted for sickle cell crisis, found to have bacteremia with 2/2 Staph Aureus (MSSA),likely line infection. Appreciate Dr Storm Frisk recommendation. She had her port removed on 2/21. She was transfused 1 unit of PRBC bringing her Hb from 6.2 g to 7 g on 2/21; her total bili improved to 12 from 18 as well. She is now on Ancef 2g IV q 8.   She was monitored and her H/H decreased again on 2/25 and therefore an additional unit of pRBC's was ordered.  Potassium was 6.5 on 2/25 and hyperkalemia treatment was initiated. Patient continued to improve and outpatient IV antibiotics were set up.  She will need 4 weeks from negative blood cultures.  She was stable for discharge on 11/20/16.  Discharge Diagnoses:  Principal Problem:   Sickle cell crisis (HCC) Active Problems:   Anemia of chronic disease   Pulmonary hypertension    Discharge Instructions  Discharge Instructions    Call MD for:  difficulty breathing, headache or visual disturbances    Complete by:  As directed    Call MD for:  extreme fatigue    Complete by:  As directed    Call MD for:  hives    Complete by:  As directed    Call MD for:  persistant dizziness or light-headedness    Complete by:  As directed    Call MD for:  persistant nausea and vomiting    Complete by:  As directed    Call MD for:  severe uncontrolled pain    Complete by:  As directed     Call MD for:  temperature >100.4    Complete by:  As directed    Discharge instructions    Complete by:  As directed    Please continue IV antibiotics Take medications as prescribed Return to hospital if symptoms return   Increase activity slowly    Complete by:  As directed      Allergies as of 11/20/2016      Reactions   Desferal [deferoxamine] Hives   Local reaction on arm only during infusion. Can take with benadryl    Latex Other (See Comments)   REACTION: Pt experiences a burning sensation on contacted skin areas   Lisinopril Other (See Comments), Cough   REACTION: Sore/scratchy throat   Tape Other (See Comments)   REACTION: Pt. Experiences a burning sensation on contacted skin areas   Tylenol [acetaminophen] Other (See Comments)   Pt. Does not take the following due to protection of kidney health. Past occurrence of Protein in urine.      Medication List    TAKE these medications   aspirin 81 MG EC tablet Take 1 tablet (81 mg total) by mouth daily.   ceFAZolin 2-4 GM/100ML-% IVPB Commonly known as:  ANCEF Inject 100 mLs (2 g total) into the vein every 8 (eight) hours. Start taking on:  [id number]   folic acid 1 MG tablet Commonly known as:  FOLVITE Take 1  tablet (1 mg total) by mouth daily.   hydroxyurea 500 MG capsule Commonly known as:  HYDREA Take 2 capsules (1,000 mg total) by mouth daily. May take with food to minimize GI side effects.   levonorgestrel 20 MCG/24HR IUD Commonly known as:  MIRENA 1 each by Intrauterine route once.   loratadine 10 MG tablet Commonly known as:  CLARITIN Take 10 mg by mouth daily.   morphine 15 MG 12 hr tablet Commonly known as:  MS CONTIN Take 1 tablet (15 mg total) by mouth every 12 (twelve) hours.   multivitamin with minerals Tabs tablet Take 1 tablet by mouth daily.   Oxycodone HCl 10 MG Tabs Take 1 tablet (10 mg total) by mouth every 4 (four) hours as needed.   potassium chloride SA 20 MEQ tablet Commonly  known as:  K-DUR,KLOR-CON Take 2 tablets (40 mEq total) by mouth 3 (three) times daily.   promethazine 12.5 MG tablet Commonly known as:  PHENERGAN Take 1 tablet (12.5 mg total) by mouth every 6 (six) hours as needed for nausea or vomiting.   Vitamin D (Ergocalciferol) 50000 units Caps capsule Commonly known as:  DRISDOL Take 1 capsule (50,000 Units total) by mouth every 7 (seven) days. Takes on Sundays.       Allergies  Allergen Reactions  . Desferal [Deferoxamine] Hives    Local reaction on arm only during infusion. Can take with benadryl   . Latex Other (See Comments)    REACTION: Pt experiences a burning sensation on contacted skin areas  . Lisinopril Other (See Comments) and Cough    REACTION: Sore/scratchy throat  . Tape Other (See Comments)    REACTION: Pt. Experiences a burning sensation on contacted skin areas  . Tylenol [Acetaminophen] Other (See Comments)    Pt. Does not take the following due to protection of kidney health. Past occurrence of Protein in urine.    Consultations:  CM  Cardiology  Surgery  ID (over phone)   Procedures/Studies: Dg Chest 2 View  Result Date: 11/11/2016 CLINICAL DATA:  Chest pain.  Sickle cell disease. EXAM: CHEST  2 VIEW COMPARISON:  Chest radiograph October 02, 2016 and chest CT October 03, 2016 FINDINGS: No edema or consolidation. Heart is mildly enlarged with pulmonary venous hypertension. No adenopathy evident. Port-A-Cath tip is in the superior vena cava. No pneumothorax. No bone lesions evident. IMPRESSION: Evidence of pulmonary vascular congestion without edema or consolidation. No evident adenopathy. Port-A-Cath tip in superior vena cava. Electronically Signed   By: Lowella Grip III M.D.   On: 11/11/2016 12:01     Echocardiogram:  The cavity size was normal. Wall thickness was normal. Systolic function was normal. The estimated ejection fraction was in the range of 60% to 65%. Wall motion was normal; there were no  regional wall motion abnormalities. Features are consistent with a pseudonormal left ventricular filling pattern, with concomitant abnormal relaxation and increased filling pressure (grade 2 diastolic dysfunction). Doppler parameters are consistent with high ventricular filling pressure.   Subjective:   Discharge Exam: Vitals:   11/19/16 2137 11/20/16 0606  BP: 139/82 131/90  Pulse: 84 85  Resp: 18 18  Temp: 98.5 F (36.9 C) 98.4 F (36.9 C)   Vitals:   11/19/16 1240 11/19/16 1419 11/19/16 2137 11/20/16 0606  BP: 132/69 (!) 155/90 139/82 131/90  Pulse: 92 89 84 85  Resp: 18 18 18 18   Temp: 98.5 F (36.9 C) 98.7 F (37.1 C) 98.5 F (36.9 C) 98.4 F (36.9 C)  TempSrc: Oral Oral Oral Oral  SpO2: 100% 98% 96% 97%  Weight:      Height:        General: Pt is alert, awake, not in acute distress Cardiovascular: RRR, S1/S2 +, no rubs, no gallops Respiratory: CTA bilaterally, no wheezing, no rhonchi Abdominal: Soft, NT, ND, bowel sounds + Extremities: no edema, no cyanosis, PICC line in the left upper extremity    The results of significant diagnostics from this hospitalization (including imaging, microbiology, ancillary and laboratory) are listed below for reference.     Microbiology: Recent Results (from the past 240 hour(s))  Culture, blood (Routine X 2) w Reflex to ID Panel     Status: Abnormal   Collection Time: 11/11/16  6:43 PM  Result Value Ref Range Status   Specimen Description BLOOD RIGHT HAND  Final   Special Requests BOTTLES DRAWN AEROBIC AND ANAEROBIC [facility] EACH  Final   Culture  Setup Time   Final    GRAM POSITIVE COCCI IN CLUSTERS Gram Stain Report Called to,Read Back By and Verified With: MAYS J. AT 1115A ON [id number] BY THOMPSON S. IN BOTH AEROBIC AND ANAEROBIC BOTTLES CRITICAL RESULT CALLED TO, READ BACK BY AND VERIFIED WITH: S. Hall Pharm.D. 16:35 11/12/16 ([email or web address])    Culture (A)  Final    STAPHYLOCOCCUS AUREUS SUSCEPTIBILITIES PERFORMED ON PREVIOUS  CULTURE WITHIN THE LAST 5 DAYS. Performed at [facility], [place] [address] [place], [place] [address]    Report Status 11/14/2016 FINAL  Final  Culture, blood (Routine X 2) w Reflex to ID Panel     Status: Abnormal   Collection Time: 11/11/16  6:45 PM  Result Value Ref Range Status   Specimen Description BLOOD LEFT HAND  Final   Special Requests BOTTLES DRAWN AEROBIC AND ANAEROBIC [facility] EACH  Final   Culture  Setup Time   Final    GRAM POSITIVE COCCI IN CLUSTERS Gram Stain Report Called to,Read Back By and Verified With: MAYS J. AT 1115A ON [id number] BY THOMPSON S. IN BOTH AEROBIC AND ANAEROBIC BOTTLES Organism ID to follow CRITICAL RESULT CALLED TO, READ BACK BY AND VERIFIED WITH: S. Hall Pharm.D. 16:35 11/12/16 ([email or web address]) Performed at [facility], [place] [address], [place], [place] [address]    Culture STAPHYLOCOCCUS AUREUS (A)  Final   Report Status 11/14/2016 FINAL  Final   Organism ID, Bacteria STAPHYLOCOCCUS AUREUS  Final      Susceptibility   Staphylococcus aureus - MIC*    CIPROFLOXACIN <=0.5 SENSITIVE Sensitive     ERYTHROMYCIN <=0.25 SENSITIVE Sensitive     GENTAMICIN <=0.5 SENSITIVE Sensitive     OXACILLIN <=0.25 SENSITIVE Sensitive     TETRACYCLINE <=1 SENSITIVE Sensitive     VANCOMYCIN 1 SENSITIVE Sensitive     TRIMETH/SULFA <=10 SENSITIVE Sensitive     CLINDAMYCIN <=0.25 SENSITIVE Sensitive     RIFAMPIN <=0.5 SENSITIVE Sensitive     Inducible Clindamycin NEGATIVE Sensitive     * STAPHYLOCOCCUS AUREUS  Blood Culture ID Panel (Reflexed)     Status: Abnormal   Collection Time: 11/11/16  6:45 PM  Result Value Ref Range Status   Enterococcus species NOT DETECTED NOT DETECTED Final   Listeria monocytogenes NOT DETECTED NOT DETECTED Final   Staphylococcus species DETECTED (A) NOT DETECTED Final    Comment: CRITICAL RESULT CALLED TO, READ BACK BY AND VERIFIED WITH: S. Hall Pharm.D. 16:35 11/12/16 ([email or web address])    Staphylococcus aureus DETECTED (A) NOT DETECTED Final     Comment: Methicillin (  oxacillin) susceptible Staphylococcus aureus (MSSA). Preferred therapy is anti staphylococcal beta lactam antibiotic (Cefazolin or Nafcillin), unless clinically contraindicated. CRITICAL RESULT CALLED TO, READ BACK BY AND VERIFIED WITH: S. Hall Pharm.D. 16:35 11/12/16 (wilsonm)    Methicillin resistance NOT DETECTED NOT DETECTED Final   Streptococcus species NOT DETECTED NOT DETECTED Final   Streptococcus agalactiae NOT DETECTED NOT DETECTED Final   Streptococcus pneumoniae NOT DETECTED NOT DETECTED Final   Streptococcus pyogenes NOT DETECTED NOT DETECTED Final   Acinetobacter baumannii NOT DETECTED NOT DETECTED Final   Enterobacteriaceae species NOT DETECTED NOT DETECTED Final   Enterobacter cloacae complex NOT DETECTED NOT DETECTED Final   Escherichia coli NOT DETECTED NOT DETECTED Final   Klebsiella oxytoca NOT DETECTED NOT DETECTED Final   Klebsiella pneumoniae NOT DETECTED NOT DETECTED Final   Proteus species NOT DETECTED NOT DETECTED Final   Serratia marcescens NOT DETECTED NOT DETECTED Final   Haemophilus influenzae NOT DETECTED NOT DETECTED Final   Neisseria meningitidis NOT DETECTED NOT DETECTED Final   Pseudomonas aeruginosa NOT DETECTED NOT DETECTED Final   Candida albicans NOT DETECTED NOT DETECTED Final   Candida glabrata NOT DETECTED NOT DETECTED Final   Candida krusei NOT DETECTED NOT DETECTED Final   Candida parapsilosis NOT DETECTED NOT DETECTED Final   Candida tropicalis NOT DETECTED NOT DETECTED Final    Comment: Performed at [facility], [address], [place], [place] [address]  Culture, blood (Routine X 2) w Reflex to ID Panel     Status: Abnormal   Collection Time: 11/12/16  8:22 AM  Result Value Ref Range Status   Specimen Description PORTA CATH DRAWN BY RN  Final   Special Requests BOTTLES DRAWN AEROBIC AND ANAEROBIC 6CC  Final   Culture  Setup Time   Final    GRAM POSITIVE COCCI Gram Stain Report Called to,Read Back By and  Verified With: [place]. AT 1932 ON 11/12/16 BY EVA IN BOTH AEROBIC AND ANAEROBIC BOTTLES CRITICAL VALUE NOTED.  VALUE IS CONSISTENT WITH PREVIOUSLY REPORTED AND CALLED VALUE.    Culture (A)  Final    STAPHYLOCOCCUS AUREUS SUSCEPTIBILITIES PERFORMED ON PREVIOUS CULTURE WITHIN THE LAST 5 DAYS. Performed at [facility], [place] [address], [place], [place] [address]    Report Status 11/14/2016 FINAL  Final  Culture, blood (Routine X 2) w Reflex to ID Panel     Status: Abnormal   Collection Time: 11/12/16  8:44 AM  Result Value Ref Range Status   Specimen Description PORTA CATH DRAWN BY RN  Final   Special Requests BOTTLES DRAWN AEROBIC AND ANAEROBIC 8CC  Final   Culture  Setup Time   Final    GRAM POSITIVE COCCI IN CLUSTERS Gram Stain Report Called to,Read Back By and Verified With: MAYS J. AT 1309 ON [id number] BY THOMPSON S.    Culture (A)  Final    STAPHYLOCOCCUS AUREUS SUSCEPTIBILITIES PERFORMED ON PREVIOUS CULTURE WITHIN THE LAST 5 DAYS. Performed at [facility], [place] [address] [place], [place] [address]    Report Status 11/15/2016 FINAL  Final  Culture, blood (Routine X 2) w Reflex to ID Panel     Status: None   Collection Time: 11/13/16 11:05 AM  Result Value Ref Range Status   Specimen Description BLOOD LEFT HAND  Final   Special Requests BOTTLES DRAWN AEROBIC ONLY [facility]  Final   Culture NO GROWTH 6 DAYS  Final   Report Status 11/19/2016 FINAL  Final  Surgical pcr screen  Status: None   Collection Time: 11/13/16  7:38 PM  Result Value Ref Range Status   MRSA, PCR NEGATIVE NEGATIVE Final   Staphylococcus aureus NEGATIVE NEGATIVE Final    Comment:        The Xpert SA Assay (FDA approved for NASAL specimens in patients over 71 years of age), is one component of a comprehensive surveillance program.  Test performance has been validated by [facility] for patients greater than or equal to 15 year old. It is not intended to diagnose infection nor to guide or  monitor treatment.   Cath Tip Culture     Status: None   Collection Time: 11/14/16  9:03 AM  Result Value Ref Range Status   Specimen Description CATH TIP  Final   Special Requests NONE  Final   Culture   Final    NO GROWTH 3 DAYS Performed at [facility], [address], [place], [place] [address]    Report Status 11/17/2016 FINAL  Final     Labs: BNP (last 3 results) No results for input(s): BNP in the last 8760 hours. Basic Metabolic Panel:  Recent Labs Lab 11/14/16 0643 11/16/16 0906 11/18/16 0849 11/18/16 1229 11/19/16 0822  NA 133* 135 127* 140 134*  K 3.3* 3.8 6.5* 2.9* 3.8  CL 105 107 102 113* 104  CO2 23 23 23  21* 26  GLUCOSE 88 101* 101* 106* 76  BUN <5* 5* <5* <5* <5*  CREATININE 0.45 0.48 0.40* 0.47 0.50  CALCIUM 7.9* 8.3* 7.0* 7.3* 8.3*   Liver Function Tests:  Recent Labs Lab 11/14/16 0643 11/16/16 0906 11/18/16 0849  AST 89* 90* 92*  ALT 29 25 20   ALKPHOS 92 122 111  BILITOT 12.8* 11.4* 7.6*  PROT 6.5 6.9 6.1*  ALBUMIN 2.1* 2.2* 1.9*   No results for input(s): LIPASE, AMYLASE in the last 168 hours. No results for input(s): AMMONIA in the last 168 hours. CBC:  Recent Labs Lab 11/14/16 0643 11/16/16 0906 11/18/16 0849 11/18/16 1229 11/19/16 1645  WBC 9.3 11.1* 7.1  --  7.4  NEUTROABS 5.0 6.2 3.2  --  3.4  HGB 7.0* 7.4* 6.3* 6.9* 8.3*  HCT 19.5* 20.3* 17.4* 18.7* 23.1*  MCV 96.5 98.5 99.4  --  97.5  PLT 132* 206 221  --  278   Cardiac Enzymes: No results for input(s): CKTOTAL, CKMB, CKMBINDEX, TROPONINI in the last 168 hours. BNP: Invalid input(s): POCBNP CBG: No results for input(s): GLUCAP in the last 168 hours. D-Dimer No results for input(s): DDIMER in the last 72 hours. Hgb A1c No results for input(s): HGBA1C in the last 72 hours. Lipid Profile No results for input(s): CHOL, HDL, LDLCALC, TRIG, CHOLHDL, LDLDIRECT in the last 72 hours. Thyroid function studies No results for input(s): TSH, T4TOTAL, T3FREE,  THYROIDAB in the last 72 hours.  Invalid input(s): FREET3 Anemia work up No results for input(s): VITAMINB12, FOLATE, FERRITIN, TIBC, IRON, RETICCTPCT in the last 72 hours. Urinalysis    Component Value Date/Time   COLORURINE YELLOW 11/09/2016 1235   APPEARANCEUR CLEAR 11/09/2016 1235   LABSPEC 1.003 (L) 11/09/2016 1235   PHURINE 7.0 11/09/2016 1235   GLUCOSEU NEGATIVE 11/09/2016 1235   HGBUR MODERATE (A) 11/09/2016 1235   BILIRUBINUR NEGATIVE 11/09/2016 1235   KETONESUR NEGATIVE 11/09/2016 1235   PROTEINUR 30 (A) 11/09/2016 1235   UROBILINOGEN >=8.0 09/07/2016 1004   NITRITE NEGATIVE 11/09/2016 1235   LEUKOCYTESUR NEGATIVE 11/09/2016 1235   Sepsis Labs Invalid input(s): PROCALCITONIN,  WBC,  [facility] Recent Results (from the past 240 hour(s))  Culture, blood (Routine X 2) w Reflex to ID Panel     Status: Abnormal   Collection Time: 11/11/16  6:43 PM  Result Value Ref Range Status   Specimen Description BLOOD RIGHT HAND  Final   Special Requests BOTTLES DRAWN AEROBIC AND ANAEROBIC [facility] EACH  Final   Culture  Setup Time   Final    GRAM POSITIVE COCCI IN CLUSTERS Gram Stain Report Called to,Read Back By and Verified With: MAYS J. AT 1115A ON [id number] BY THOMPSON S. IN BOTH AEROBIC AND ANAEROBIC BOTTLES CRITICAL RESULT CALLED TO, READ BACK BY AND VERIFIED WITH: S. Hall Pharm.D. 16:35 11/12/16 (wilsonm)    Culture (A)  Final    STAPHYLOCOCCUS AUREUS SUSCEPTIBILITIES PERFORMED ON PREVIOUS CULTURE WITHIN THE LAST 5 DAYS. Performed at [facility], [place] [address] [place], [place] [address]    Report Status 11/14/2016 FINAL  Final  Culture, blood (Routine X 2) w Reflex to ID Panel     Status: Abnormal   Collection Time: 11/11/16  6:45 PM  Result Value Ref Range Status   Specimen Description BLOOD LEFT HAND  Final   Special Requests BOTTLES DRAWN AEROBIC AND ANAEROBIC [facility] EACH  Final   Culture  Setup Time   Final    GRAM POSITIVE COCCI IN CLUSTERS Gram Stain  Report Called to,Read Back By and Verified With: MAYS J. AT 1115A ON [id number] BY THOMPSON S. IN BOTH AEROBIC AND ANAEROBIC BOTTLES Organism ID to follow CRITICAL RESULT CALLED TO, READ BACK BY AND VERIFIED WITH: S. Hall Pharm.D. 16:35 11/12/16 ([email or web address]) Performed at [facility], [place] [address] [place], [place] [address]    Culture STAPHYLOCOCCUS AUREUS (A)  Final   Report Status 11/14/2016 FINAL  Final   Organism ID, Bacteria STAPHYLOCOCCUS AUREUS  Final      Susceptibility   Staphylococcus aureus - MIC*    CIPROFLOXACIN <=0.5 SENSITIVE Sensitive     ERYTHROMYCIN <=0.25 SENSITIVE Sensitive     GENTAMICIN <=0.5 SENSITIVE Sensitive     OXACILLIN <=0.25 SENSITIVE Sensitive     TETRACYCLINE <=1 SENSITIVE Sensitive     VANCOMYCIN 1 SENSITIVE Sensitive     TRIMETH/SULFA <=10 SENSITIVE Sensitive     CLINDAMYCIN <=0.25 SENSITIVE Sensitive     RIFAMPIN <=0.5 SENSITIVE Sensitive     Inducible Clindamycin NEGATIVE Sensitive     * STAPHYLOCOCCUS AUREUS  Blood Culture ID Panel (Reflexed)     Status: Abnormal   Collection Time: 11/11/16  6:45 PM  Result Value Ref Range Status   Enterococcus species NOT DETECTED NOT DETECTED Final   Listeria monocytogenes NOT DETECTED NOT DETECTED Final   Staphylococcus species DETECTED (A) NOT DETECTED Final    Comment: CRITICAL RESULT CALLED TO, READ BACK BY AND VERIFIED WITH: S. Hall Pharm.D. 16:35 11/12/16 (wilsonm)    Staphylococcus aureus DETECTED (A) NOT DETECTED Final    Comment: Methicillin (oxacillin) susceptible Staphylococcus aureus (MSSA). Preferred therapy is anti staphylococcal beta lactam antibiotic (Cefazolin or Nafcillin), unless clinically contraindicated. CRITICAL RESULT CALLED TO, READ BACK BY AND VERIFIED WITH: S. Hall Pharm.D. 16:35 11/12/16 (wilsonm)    Methicillin resistance NOT DETECTED NOT DETECTED Final   Streptococcus species NOT DETECTED NOT DETECTED Final   Streptococcus agalactiae NOT DETECTED NOT DETECTED Final    Streptococcus pneumoniae NOT DETECTED NOT DETECTED Final   Streptococcus pyogenes NOT DETECTED NOT DETECTED Final   Acinetobacter baumannii NOT DETECTED NOT DETECTED Final   Enterobacteriaceae species  NOT DETECTED NOT DETECTED Final   Enterobacter cloacae complex NOT DETECTED NOT DETECTED Final   Escherichia coli NOT DETECTED NOT DETECTED Final   Klebsiella oxytoca NOT DETECTED NOT DETECTED Final   Klebsiella pneumoniae NOT DETECTED NOT DETECTED Final   Proteus species NOT DETECTED NOT DETECTED Final   Serratia marcescens NOT DETECTED NOT DETECTED Final   Haemophilus influenzae NOT DETECTED NOT DETECTED Final   Neisseria meningitidis NOT DETECTED NOT DETECTED Final   Pseudomonas aeruginosa NOT DETECTED NOT DETECTED Final   Candida albicans NOT DETECTED NOT DETECTED Final   Candida glabrata NOT DETECTED NOT DETECTED Final   Candida krusei NOT DETECTED NOT DETECTED Final   Candida parapsilosis NOT DETECTED NOT DETECTED Final   Candida tropicalis NOT DETECTED NOT DETECTED Final    Comment: Performed at [facility], [place] [address] [place], [place] [address]  Culture, blood (Routine X 2) w Reflex to ID Panel     Status: Abnormal   Collection Time: 11/12/16  8:22 AM  Result Value Ref Range Status   Specimen Description PORTA CATH DRAWN BY RN  Final   Special Requests BOTTLES DRAWN AEROBIC AND ANAEROBIC 6CC  Final   Culture  Setup Time   Final    GRAM POSITIVE COCCI Gram Stain Report Called to,Read Back By and Verified With: [place]. AT 1932 ON 11/12/16 BY EVA IN BOTH AEROBIC AND ANAEROBIC BOTTLES CRITICAL VALUE NOTED.  VALUE IS CONSISTENT WITH PREVIOUSLY REPORTED AND CALLED VALUE.    Culture (A)  Final    STAPHYLOCOCCUS AUREUS SUSCEPTIBILITIES PERFORMED ON PREVIOUS CULTURE WITHIN THE LAST 5 DAYS. Performed at [facility], [place] [address] [place], [place] [address]    Report Status 11/14/2016 FINAL  Final  Culture, blood (Routine X 2) w Reflex to ID Panel     Status:  Abnormal   Collection Time: 11/12/16  8:44 AM  Result Value Ref Range Status   Specimen Description PORTA CATH DRAWN BY RN  Final   Special Requests BOTTLES DRAWN AEROBIC AND ANAEROBIC 8CC  Final   Culture  Setup Time   Final    GRAM POSITIVE COCCI IN CLUSTERS Gram Stain Report Called to,Read Back By and Verified With: MAYS J. AT 1309 ON [id number] BY THOMPSON S.    Culture (A)  Final    STAPHYLOCOCCUS AUREUS SUSCEPTIBILITIES PERFORMED ON PREVIOUS CULTURE WITHIN THE LAST 5 DAYS. Performed at [facility], [place] [address] [place], [place] [address]    Report Status 11/15/2016 FINAL  Final  Culture, blood (Routine X 2) w Reflex to ID Panel     Status: None   Collection Time: 11/13/16 11:05 AM  Result Value Ref Range Status   Specimen Description BLOOD LEFT HAND  Final   Special Requests BOTTLES DRAWN AEROBIC ONLY 6CC  Final   Culture NO GROWTH 6 DAYS  Final   Report Status 11/19/2016 FINAL  Final  Surgical pcr screen     Status: None   Collection Time: 11/13/16  7:38 PM  Result Value Ref Range Status   MRSA, PCR NEGATIVE NEGATIVE Final   Staphylococcus aureus NEGATIVE NEGATIVE Final    Comment:        The Xpert SA Assay (FDA approved for NASAL specimens in patients over 71 years of age), is one component of a comprehensive surveillance program.  Test performance has been validated by [facility] for patients greater than or equal to 61 year old. It is not intended to diagnose infection nor  to guide or monitor treatment.   Cath Tip Culture     Status: None   Collection Time: 11/14/16  9:03 AM  Result Value Ref Range Status   Specimen Description CATH TIP  Final   Special Requests NONE  Final   Culture   Final    NO GROWTH 3 DAYS Performed at [facility], [address] [place], [place] [address]    Report Status 11/17/2016 FINAL  Final     Time coordinating discharge: 40 minutes  SIGNED:   Loretha Stapler, MD  [facility] 11/20/2016, 4:25  PM Pager [phone] If 7PM-7AM, please contact night-coverage [email or web address] Password [email or web address]

## 2016-11-20 NOTE — Care Management (Signed)
Patient discharging today. Walker RN and IV antibiotics arranged with [facility]. Patient will receive today's antibiotic doses and [facility] will come out tomorrow morning.

## 2016-11-20 NOTE — Care Management Note (Signed)
Case Management Note  Patient Details  Name: Dawn [name] MRN: [id number] Date of Birth: 04-Aug-1981   If discussed at [facility] Length of Stay Meetings, dates discussed:  11/20/2016  Additional Comments:  Shaylee Stanislawski, Chauncey [place], RN 11/20/2016, 12:29 PM

## 2016-11-21 DIAGNOSIS — D5700 Hb-SS disease with crisis, unspecified: Secondary | ICD-10-CM | POA: Diagnosis not present

## 2016-11-21 DIAGNOSIS — I272 Pulmonary hypertension, unspecified: Secondary | ICD-10-CM | POA: Diagnosis not present

## 2016-11-21 DIAGNOSIS — D638 Anemia in other chronic diseases classified elsewhere: Secondary | ICD-10-CM | POA: Diagnosis not present

## 2016-11-21 DIAGNOSIS — Z792 Long term (current) use of antibiotics: Secondary | ICD-10-CM | POA: Diagnosis not present

## 2016-11-21 DIAGNOSIS — B9561 Methicillin susceptible Staphylococcus aureus infection as the cause of diseases classified elsewhere: Secondary | ICD-10-CM | POA: Diagnosis not present

## 2016-11-21 DIAGNOSIS — T80211A Bloodstream infection due to central venous catheter, initial encounter: Secondary | ICD-10-CM | POA: Diagnosis not present

## 2016-11-21 DIAGNOSIS — Z452 Encounter for adjustment and management of vascular access device: Secondary | ICD-10-CM | POA: Diagnosis not present

## 2016-11-22 ENCOUNTER — Emergency Department (HOSPITAL_COMMUNITY)
Admission: EM | Admit: 2016-11-22 | Discharge: 2016-11-22 | Disposition: A | Discharge status: Home / Self Care | Payer: Medicare Other | Attending: Emergency Medicine | Admitting: Emergency Medicine

## 2016-11-22 ENCOUNTER — Encounter (HOSPITAL_COMMUNITY): Payer: Self-pay | Admitting: *Deleted

## 2016-11-22 DIAGNOSIS — D5700 Hb-SS disease with crisis, unspecified: Secondary | ICD-10-CM

## 2016-11-22 DIAGNOSIS — Z7982 Long term (current) use of aspirin: Secondary | ICD-10-CM | POA: Diagnosis not present

## 2016-11-22 DIAGNOSIS — Z79899 Other long term (current) drug therapy: Secondary | ICD-10-CM | POA: Diagnosis not present

## 2016-11-22 DIAGNOSIS — M79604 Pain in right leg: Secondary | ICD-10-CM | POA: Diagnosis present

## 2016-11-22 LAB — COMPREHENSIVE METABOLIC PANEL
ALK PHOS: 116 U/L (ref 38–126)
ALT: 24 U/L (ref 14–54)
ANION GAP: 7 (ref 5–15)
AST: 128 U/L — ABNORMAL HIGH (ref 15–41)
Albumin: 2.4 g/dL — ABNORMAL LOW (ref 3.5–5.0)
BUN: 7 mg/dL (ref 6–20)
CO2: 25 mmol/L (ref 22–32)
Calcium: 8.6 mg/dL — ABNORMAL LOW (ref 8.9–10.3)
Chloride: 104 mmol/L (ref 101–111)
Creatinine, Ser: 0.57 mg/dL (ref 0.44–1.00)
GFR calc non Af Amer: >60 mL/min (ref >60)
Glucose, Bld: 120 mg/dL — ABNORMAL HIGH (ref 65–99)
POTASSIUM: 3.5 mmol/L (ref 3.5–5.1)
SODIUM: 136 mmol/L (ref 135–145)
TOTAL PROTEIN: 7.7 g/dL (ref 6.5–8.1)
Total Bilirubin: 9.8 mg/dL — ABNORMAL HIGH (ref 0.3–1.2)

## 2016-11-22 LAB — CBC
HCT: 22.7 % — ABNORMAL LOW (ref 36.0–46.0)
Hemoglobin: 8.1 g/dL — ABNORMAL LOW (ref 12.0–15.0)
MCH: 35.1 pg — AB (ref 26.0–34.0)
MCHC: 35.7 g/dL (ref 30.0–36.0)
MCV: 98.3 fL (ref 78.0–100.0)
Platelets: 417 10*3/uL — ABNORMAL HIGH (ref 150–400)
RBC: 2.31 MIL/uL — ABNORMAL LOW (ref 3.87–5.11)
RDW: 23.9 % — AB (ref 11.5–15.5)
WBC: 5.8 10*3/uL (ref 4.0–10.5)

## 2016-11-22 LAB — RETICULOCYTES
RBC.: 2.31 MIL/uL — AB (ref 3.87–5.11)
RETIC COUNT ABSOLUTE: 235.6 10*3/uL — AB (ref 19.0–186.0)
Retic Ct Pct: 10.2 % — ABNORMAL HIGH (ref 0.4–3.1)

## 2016-11-22 MED ORDER — HEPARIN SOD (PORK) LOCK FLUSH 100 UNIT/ML IV SOLN
INTRAVENOUS | Status: AC
  Administered 2016-11-22: 14:00:00
  Filled 2016-11-22: qty 5

## 2016-11-22 MED ORDER — HYDROMORPHONE HCL 1 MG/ML IJ SOLN: 2.0000 mg | Freq: Once | INTRAMUSCULAR | Status: DC

## 2016-11-22 MED ORDER — HYDROMORPHONE HCL 1 MG/ML IJ SOLN
2.0000 mg | Freq: Once | INTRAMUSCULAR | Status: AC
  Administered 2016-11-22: 2 mg via INTRAVENOUS
  Filled 2016-11-22: qty 2

## 2016-11-22 MED ORDER — ONDANSETRON HCL 4 MG/2ML IJ SOLN
4.0000 mg | Freq: Once | INTRAMUSCULAR | Status: AC
  Administered 2016-11-22: 4 mg via INTRAVENOUS
  Filled 2016-11-22: qty 2

## 2016-11-22 MED ORDER — SODIUM CHLORIDE 0.9 % IV BOLUS (SEPSIS)
500.0000 mL | Freq: Once | INTRAVENOUS | Status: AC
  Administered 2016-11-22: 500 mL via INTRAVENOUS

## 2016-11-22 MED ORDER — HYDROMORPHONE HCL 2 MG/ML IJ SOLN
INTRAMUSCULAR | Status: AC
  Filled 2016-11-22: qty 1

## 2016-11-22 MED ORDER — HYDROMORPHONE HCL 2 MG/ML IJ SOLN
2.0000 mg | Freq: Once | INTRAMUSCULAR | Status: AC
  Administered 2016-11-22: 2 mg via INTRAVENOUS

## 2016-11-22 NOTE — Discharge Instructions (Signed)
It was our pleasure to provide your ER care today - we hope that you feel better.  Rest. Drink plenty of fluids.  Take your pain medication as need.   Follow up with your doctor in the next 1-2 days for recheck.  Return to ER if worse, new symptoms, fevers, chest pain, trouble breathing, intractable pain, other concern.  You were given pain medication in the ER - no driving for the next 8 hours, or any time when taking narcotic pain medication.

## 2016-11-22 NOTE — ED Triage Notes (Signed)
Pt c/o mid to lower pain back and bilateral leg pain that has progressively gotten worse over the last few days. Pt recently hospitalized for blood stream infection from portacath and feels this pain has been coming from the infection. Portacath has now been removed. Pt currently has PICC line in place.

## 2016-11-22 NOTE — ED Provider Notes (Addendum)
[facility] DEPT Provider Note   CSN: [id number] Arrival date & time: 11/22/16  [id number]   By signing my name below, I, Dawn [name], attest that this documentation has been prepared under the direction and in the presence of Lajean Saver, MD. Electronically Signed: Hilbert [name], Scribe. 11/22/16. 9:03 AM. History   Chief Complaint Chief Complaint  Patient presents with  . Sickle Cell Pain Crisis    The history is provided by the patient. No language interpreter was used.  HPI Comments: Dawn [name] is a 36 y.o. female who presents to the Emergency Department complaining of worsening, constant, right leg and back pain for the past 2 days. She states that she is having a sickle cell pain crisis. She reports that this pain is typical during her crisis. She states that she has been taking oxycodone every 4-6 hours for her pain with no significant relief. She was recently seen here a few days ago for an infection. She states that the infection was coming from her old portacath in right chest - that portocath was removed. Marland Kitchen She was prescribed antibiotics at that time and was advised to take it 3 times a day. She denies fever, cough, sore throat, vomiting, SOB, rhinorrhea, and any urinary symptoms.  Since d/c from hospital, no fever or chills.      Past Medical History:  Diagnosis Date  . Blood transfusion   . Demand ischemia ([place]) 02/06/2016  . HCAP (healthcare-associated pneumonia) 05/19/2013  . 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 Pulmonary hypertension 02/06/2016   49 mmHg per echo on 02/06/16   . 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 Sickle cell anemia (HCC)   . Sickle cell disease ([place])   . 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 Vitamin B12 deficiency 07/07/2015    Patient Active Problem List   Diagnosis Date Noted  . Sickle cell anemia with pain ([place]) 11/06/2016  . Syncope 10/17/2016  . Dizziness   . Hypomagnesemia   . Swelling of right upper extremity   . SVC  obstruction 10/13/2016  . Sickle cell crisis ([place]) 10/03/2016  . Positive D dimer   . Pulmonary hypertension 02/16/2016  . 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 Anemia of chronic disease   . Chronic pain syndrome 10/14/2015  . 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 Vitamin B12 deficiency 07/07/2015  . Thrombocytopenia ([place]) 07/06/2015  . Hyperbilirubinemia 09/11/2014  . Macrocytosis 06/13/2014  . Vitamin D deficiency 11/23/2013  . 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 Hypokalemia 03/28/2012  . 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 Elevated LFTs 04/27/2011  . Sickle cell disease ([place]) 04/26/2011    Past Surgical History:  Procedure Laterality Date  .  left knee ACL reconstruction    . CESAREAN SECTION     x 2  . CHOLECYSTECTOMY    . IR GENERIC HISTORICAL  07/17/2016   IR FLUORO GUIDE PORT INSERTION RIGHT 07/17/2016 Aletta Edouard, MD [facility]  . IR GENERIC HISTORICAL  07/17/2016   IR US GUIDE VASC ACCESS RIGHT 07/17/2016 Aletta Edouard, MD [facility]  . IR GENERIC HISTORICAL  10/12/2016   IR PTA VENOUS EXCEPT DIALYSIS CIRCUIT 10/12/2016 Arne Cleveland, MD [facility]  . IR GENERIC HISTORICAL  10/12/2016   IR US GUIDE VASC ACCESS RIGHT 10/12/2016 Arne Cleveland, MD [facility]  . IR GENERIC HISTORICAL  10/12/2016   IR VENO/EXT/UNI RIGHT 10/12/2016 Arne Cleveland, MD [facility]  . PORT-A-CATH REMOVAL Left 07/29/2013  Procedure: REMOVAL PORT-A-CATH;  Surgeon: Scherry Ran, MD;  Location: [facility];  Service: General;  Laterality: Left;  . PORT-A-CATH REMOVAL Right 11/14/2016   Procedure: REMOVAL PORT-A-CATH;  Surgeon: Aviva Signs, MD;  Location: [facility];  Service: General;  Laterality: Right;  . PORTACATH PLACEMENT    . PORTACATH PLACEMENT Left 02/23/2013   Procedure: INSERTION PORT-A-CATH;  Surgeon: Donato Heinz, MD;  Location: [facility];  Service: General;  Laterality: Left;  . TEE WITHOUT  CARDIOVERSION N/A 08/07/2013   Procedure: TRANSESOPHAGEAL ECHOCARDIOGRAM (TEE);  Surgeon: Arnoldo Lenis, MD;  Location: [facility];  Service: Cardiology;  Laterality: N/A;    OB History    Gravida Para Term Preterm AB Living   1 1   1   1    SAB TAB Ectopic Multiple Live Births                   Home Medications    Prior to Admission medications   Medication Sig Start Date End Date Taking? Authorizing Provider  aspirin EC 81 MG EC tablet Take 1 tablet (81 mg total) by mouth daily. 10/18/16   Leana Gamer, MD  ceFAZolin (ANCEF) 2-4 GM/100ML-% IVPB Inject 100 mLs (2 g total) into the vein every 8 (eight) hours. 11/21/16 12/19/16  Eber Jones, MD  folic acid (FOLVITE) 1 MG tablet Take 1 tablet (1 mg total) by mouth daily. 03/14/16   [name], FNP  hydroxyurea (HYDREA) 500 MG capsule Take 2 capsules (1,000 mg total) by mouth daily. May take with food to minimize GI side effects. 03/14/16   [name], FNP  levonorgestrel (MIRENA) 20 MCG/24HR IUD 1 each by Intrauterine route once.    Historical Provider, MD  loratadine (CLARITIN) 10 MG tablet Take 10 mg by mouth daily.    Historical Provider, MD  morphine (MS CONTIN) 15 MG 12 hr tablet Take 1 tablet (15 mg total) by mouth every 12 (twelve) hours. 11/01/16   Tresa Garter, MD  Multiple Vitamin (MULTIVITAMIN WITH MINERALS) TABS tablet Take 1 tablet by mouth daily.    Historical Provider, MD  Oxycodone HCl 10 MG TABS Take 1 tablet (10 mg total) by mouth every 4 (four) hours as needed. 11/01/16   Tresa Garter, MD  potassium chloride SA (K-DUR,KLOR-CON) 20 MEQ tablet Take 2 tablets (40 mEq total) by mouth 3 (three) times daily. 11/20/16   Eber Jones, MD  promethazine (PHENERGAN) 12.5 MG tablet Take 1 tablet (12.5 mg total) by mouth every 6 (six) hours as needed for nausea or vomiting. 11/01/16   Tresa Garter, MD  Vitamin D, Ergocalciferol, (DRISDOL) 50000 units CAPS capsule Take 1 capsule  (50,000 Units total) by mouth every 7 (seven) days. Takes on Sundays. 06/01/16   Micheline Chapman, NP    Family History Family History  Problem Relation Age of Onset  . Sickle cell trait Mother   . Sickle cell trait Father   . Hypertension Father   . Diabetes Father   . Sickle cell trait Sister   . Cancer Paternal Aunt     Breast    Social History Social History  Substance Use Topics  . Smoking status: Never Smoker  . Smokeless tobacco: Never Used  . Alcohol use No     Allergies   Desferal [deferoxamine]; Latex; Lisinopril; Tape; and Tylenol [acetaminophen]   Review of Systems Review of Systems  Constitutional: Negative for chills and fever.  HENT: Negative for rhinorrhea  and sore throat.   Eyes: Negative for visual disturbance.  Respiratory: Negative for cough.   Cardiovascular: Negative for chest pain.  Gastrointestinal: Negative for abdominal pain, nausea and vomiting.  Endocrine: Negative for polyuria.  Genitourinary: Negative for difficulty urinating and dysuria.  Musculoskeletal: Positive for back pain and myalgias (Right Leg).  Skin: Negative for rash.  Neurological: Negative for headaches.  Psychiatric/Behavioral: Negative for confusion.     Physical Exam Updated Vital Signs BP 158/83   Pulse 99   Temp 98.8 F (37.1 C) (Oral)   Resp 20   Ht 5\' 3"  (1.6 m)   Wt 163 lb (73.9 kg)   SpO2 94%   BMI 28.87 kg/m   Physical Exam  Constitutional: She appears well-developed and well-nourished. No distress.  HENT:  Head: Normocephalic and atraumatic.  Eyes: EOM are normal. Scleral icterus is present.  Neck: Normal range of motion. Neck supple.  Cardiovascular: Normal rate, regular rhythm, normal heart sounds and intact distal pulses.  Exam reveals no gallop and no friction rub.   No murmur heard. Pulmonary/Chest: Effort normal and breath sounds normal.  Prior port site right chest healing without sign of infection.   Abdominal: Soft. Bowel sounds are  normal. She exhibits no distension and no mass. There is no tenderness. There is no rebound and no guarding.  Genitourinary:  Genitourinary Comments: No cva tenderness  Musculoskeletal: Normal range of motion. She exhibits no edema or tenderness.  Right chest where the port was removed shows a small surgical scar with no infection noted. Left upper arm: Picc line is fine with no infection noted.  Neurological: She is alert.  Skin: Skin is warm and dry. No rash noted. She is not diaphoretic.  Psychiatric: She has a normal mood and affect. Judgment normal.  Nursing note and vitals reviewed.    ED Treatments / Results  DIAGNOSTIC STUDIES: Oxygen Saturation is 94% on RA, adequate by my interpretation.    COORDINATION OF CARE: 8:52 AM Discussed treatment plan with pt at bedside and pt agreed to plan. I will start the patient on some pain medications and check her labs.  Labs (all labs ordered are listed, but only abnormal results are displayed) Results for orders placed or performed during the hospital encounter of 11/22/16  CBC  Result Value Ref Range   WBC 5.8 4.0 - 10.5 K/uL   RBC 2.31 (L) 3.87 - 5.11 MIL/uL   Hemoglobin 8.1 (L) 12.0 - 15.0 g/dL   HCT 22.7 (L) 36.0 - 46.0 %   MCV 98.3 78.0 - 100.0 fL   MCH 35.1 (H) 26.0 - 34.0 pg   MCHC 35.7 30.0 - 36.0 g/dL   RDW 23.9 (H) 11.5 - 15.5 %   Platelets 417 (H) 150 - 400 K/uL  Comprehensive metabolic panel  Result Value Ref Range   Sodium 136 135 - 145 mmol/L   Potassium 3.5 3.5 - 5.1 mmol/L   Chloride 104 101 - 111 mmol/L   CO2 25 22 - 32 mmol/L   Glucose, Bld 120 (H) 65 - 99 mg/dL   BUN 7 6 - 20 mg/dL   Creatinine, Ser 0.57 0.44 - 1.00 mg/dL   Calcium 8.6 (L) 8.9 - 10.3 mg/dL   Total Protein 7.7 6.5 - 8.1 g/dL   Albumin 2.4 (L) 3.5 - 5.0 g/dL   AST 128 (H) 15 - 41 U/L   ALT 24 14 - 54 U/L   Alkaline Phosphatase 116 38 - 126 U/L   Total Bilirubin  9.8 (H) 0.3 - 1.2 mg/dL   GFR calc non Af Amer >60 >60 mL/min   GFR calc Af  Amer >60 >60 mL/min   Anion gap 7 5 - 15  Reticulocytes  Result Value Ref Range   Retic Ct Pct 10.2 (H) 0.4 - 3.1 %   RBC. 2.31 (L) 3.87 - 5.11 MIL/uL   Retic Count, Manual 235.6 (H) 19.0 - 186.0 K/uL   Dg Chest 2 View  Result Date: 11/11/2016 CLINICAL DATA:  Chest pain.  Sickle cell disease. EXAM: CHEST  2 VIEW COMPARISON:  Chest radiograph October 02, 2016 and chest CT October 03, 2016 FINDINGS: No edema or consolidation. Heart is mildly enlarged with pulmonary venous hypertension. No adenopathy evident. Port-A-Cath tip is in the superior vena cava. No pneumothorax. No bone lesions evident. IMPRESSION: Evidence of pulmonary vascular congestion without edema or consolidation. No evident adenopathy. Port-A-Cath tip in superior vena cava. Electronically Signed   By: Lowella Grip III M.D.   On: 11/11/2016 12:01   s EKG  EKG Interpretation None       Radiology No results found.  Procedures Procedures (including critical care time)  Medications Ordered in ED Medications  sodium chloride 0.9 % bolus 500 mL (not administered)  HYDROmorphone (DILAUDID) injection 2 mg (not administered)  ondansetron (ZOFRAN) injection 4 mg (not administered)     Initial Impression / Assessment and Plan / ED Course  I have reviewed the triage vital signs and the nursing notes.  Pertinent labs & imaging results that were available during my care of the patient were reviewed by me and considered in my medical decision making (see chart for details).  Reviewed nursing notes and prior charts for additional history.   Labs ordered.  Iv ns bolus. o2 [place].   Dilaudid 2 mg iv.   Recheck pt appears more comfortable, states pain better but persists.   Dilaudid iv.   Discussed admission to hospital.  Pt indicates she feels will be able to go home, prefers d/c to home, requests additional pain med.  3rd dose of pain med in ED.    On recheck pt comfortable appear. States pain improved. No resp  depression or excessive drowsiness. Afeb. No sob. No cp.   Patient currently appears stable for d/c.     Final Clinical Impressions(s) / ED Diagnoses   Final diagnoses:  None    New Prescriptions New Prescriptions   No medications on file   I personally performed the services described in this documentation, which was scribed in my presence. The recorded information has been reviewed and considered. Lajean Saver, MD        Lajean Saver, MD 11/22/16 [phone]

## 2016-11-23 DIAGNOSIS — B9561 Methicillin susceptible Staphylococcus aureus infection as the cause of diseases classified elsewhere: Secondary | ICD-10-CM | POA: Diagnosis not present

## 2016-11-23 DIAGNOSIS — D5700 Hb-SS disease with crisis, unspecified: Secondary | ICD-10-CM | POA: Diagnosis not present

## 2016-11-23 DIAGNOSIS — Z452 Encounter for adjustment and management of vascular access device: Secondary | ICD-10-CM | POA: Diagnosis not present

## 2016-11-23 DIAGNOSIS — T80211A Bloodstream infection due to central venous catheter, initial encounter: Secondary | ICD-10-CM | POA: Diagnosis not present

## 2016-11-23 DIAGNOSIS — I272 Pulmonary hypertension, unspecified: Secondary | ICD-10-CM | POA: Diagnosis not present

## 2016-11-23 DIAGNOSIS — D638 Anemia in other chronic diseases classified elsewhere: Secondary | ICD-10-CM | POA: Diagnosis not present

## 2016-11-26 ENCOUNTER — Encounter (HOSPITAL_COMMUNITY): Payer: Self-pay | Admitting: Emergency Medicine

## 2016-11-26 ENCOUNTER — Emergency Department (HOSPITAL_COMMUNITY)
Admission: EM | Admit: 2016-11-26 | Discharge: 2016-11-26 | Disposition: A | Discharge status: Home / Self Care | Payer: Medicare Other | Attending: Emergency Medicine | Admitting: Emergency Medicine

## 2016-11-26 DIAGNOSIS — B9561 Methicillin susceptible Staphylococcus aureus infection as the cause of diseases classified elsewhere: Secondary | ICD-10-CM | POA: Diagnosis not present

## 2016-11-26 DIAGNOSIS — D5700 Hb-SS disease with crisis, unspecified: Secondary | ICD-10-CM | POA: Insufficient documentation

## 2016-11-26 DIAGNOSIS — Z79899 Other long term (current) drug therapy: Secondary | ICD-10-CM | POA: Diagnosis not present

## 2016-11-26 DIAGNOSIS — M79604 Pain in right leg: Secondary | ICD-10-CM | POA: Diagnosis present

## 2016-11-26 DIAGNOSIS — Z7982 Long term (current) use of aspirin: Secondary | ICD-10-CM | POA: Diagnosis not present

## 2016-11-26 DIAGNOSIS — Z452 Encounter for adjustment and management of vascular access device: Secondary | ICD-10-CM | POA: Diagnosis not present

## 2016-11-26 DIAGNOSIS — T80211A Bloodstream infection due to central venous catheter, initial encounter: Secondary | ICD-10-CM | POA: Diagnosis not present

## 2016-11-26 DIAGNOSIS — B9562 Methicillin resistant Staphylococcus aureus infection as the cause of diseases classified elsewhere: Secondary | ICD-10-CM | POA: Diagnosis not present

## 2016-11-26 DIAGNOSIS — Z5181 Encounter for therapeutic drug level monitoring: Secondary | ICD-10-CM | POA: Diagnosis not present

## 2016-11-26 DIAGNOSIS — D638 Anemia in other chronic diseases classified elsewhere: Secondary | ICD-10-CM | POA: Diagnosis not present

## 2016-11-26 DIAGNOSIS — I272 Pulmonary hypertension, unspecified: Secondary | ICD-10-CM | POA: Diagnosis not present

## 2016-11-26 LAB — CBC WITH DIFFERENTIAL/PLATELET
BASOS ABS: 0 10*3/uL (ref 0.0–0.1)
Basophils Relative: 1 %
Eosinophils Absolute: 0 10*3/uL (ref 0.0–0.7)
Eosinophils Relative: 1 %
HEMATOCRIT: 28.3 % — AB (ref 36.0–46.0)
Hemoglobin: 10.2 g/dL — ABNORMAL LOW (ref 12.0–15.0)
LYMPHS PCT: 37 %
Lymphs Abs: 1.3 10*3/uL (ref 0.7–4.0)
MCH: 35.7 pg — ABNORMAL HIGH (ref 26.0–34.0)
MCHC: 36 g/dL (ref 30.0–36.0)
MCV: 99 fL (ref 78.0–100.0)
MONO ABS: 0.5 10*3/uL (ref 0.1–1.0)
Monocytes Relative: 13 %
NEUTROS ABS: 1.8 10*3/uL (ref 1.7–7.7)
Neutrophils Relative %: 49 %
PLATELETS: 339 10*3/uL (ref 150–400)
RBC: 2.86 MIL/uL — AB (ref 3.87–5.11)
RDW: 21.2 % — AB (ref 11.5–15.5)
WBC: 3.6 10*3/uL — ABNORMAL LOW (ref 4.0–10.5)

## 2016-11-26 LAB — RETICULOCYTES
RBC.: 2.86 MIL/uL — AB (ref 3.87–5.11)
RETIC COUNT ABSOLUTE: 194.5 10*3/uL — AB (ref 19.0–186.0)
Retic Ct Pct: 6.8 % — ABNORMAL HIGH (ref 0.4–3.1)

## 2016-11-26 LAB — COMPREHENSIVE METABOLIC PANEL
ALBUMIN: 2.6 g/dL — AB (ref 3.5–5.0)
ALT: 25 U/L (ref 14–54)
ANION GAP: 7 (ref 5–15)
AST: 111 U/L — AB (ref 15–41)
Alkaline Phosphatase: 123 U/L (ref 38–126)
BILIRUBIN TOTAL: 9.8 mg/dL — AB (ref 0.3–1.2)
BUN: 5 mg/dL — AB (ref 6–20)
CO2: 23 mmol/L (ref 22–32)
Calcium: 8.6 mg/dL — ABNORMAL LOW (ref 8.9–10.3)
Chloride: 104 mmol/L (ref 101–111)
Creatinine, Ser: 0.53 mg/dL (ref 0.44–1.00)
GFR calc Af Amer: >60 mL/min (ref >60)
Glucose, Bld: 88 mg/dL (ref 65–99)
POTASSIUM: 3.4 mmol/L — AB (ref 3.5–5.1)
Sodium: 134 mmol/L — ABNORMAL LOW (ref 135–145)
TOTAL PROTEIN: 7.8 g/dL (ref 6.5–8.1)

## 2016-11-26 MED ORDER — HEPARIN SOD (PORK) LOCK FLUSH 100 UNIT/ML IV SOLN
INTRAVENOUS | Status: AC
  Administered 2016-11-26: 250 [IU]
  Filled 2016-11-26: qty 5

## 2016-11-26 MED ORDER — SODIUM CHLORIDE 0.9 % IV SOLN
Freq: Once | INTRAVENOUS | Status: AC
  Administered 2016-11-26: 11:00:00 via INTRAVENOUS

## 2016-11-26 MED ORDER — HYDROMORPHONE HCL 2 MG/ML IJ SOLN
2.0000 mg | Freq: Once | INTRAMUSCULAR | Status: AC
  Administered 2016-11-26: 2 mg via INTRAVENOUS
  Filled 2016-11-26: qty 1

## 2016-11-26 MED ORDER — SODIUM CHLORIDE 0.9 % IV BOLUS (SEPSIS)
1000.0000 mL | Freq: Once | INTRAVENOUS | Status: AC
  Administered 2016-11-26: 1000 mL via INTRAVENOUS

## 2016-11-26 MED ORDER — DIPHENHYDRAMINE HCL 50 MG/ML IJ SOLN
25.0000 mg | Freq: Once | INTRAMUSCULAR | Status: AC
Start: 2016-11-26 — End: 2016-11-26
  Administered 2016-11-26: 25 mg via INTRAVENOUS
  Filled 2016-11-26: qty 1

## 2016-11-26 MED ORDER — ONDANSETRON HCL 4 MG/2ML IJ SOLN
4.0000 mg | Freq: Once | INTRAMUSCULAR | Status: AC
  Administered 2016-11-26: 4 mg via INTRAVENOUS
  Filled 2016-11-26: qty 2

## 2016-11-26 NOTE — Discharge Instructions (Signed)
Call your doctor to arrange a follow-up appt.  Return to ER for any worsening symptoms

## 2016-11-26 NOTE — ED Notes (Signed)
Pt states pain 7 from 8 and reqeusting benadryl for itching. [place] PA aware

## 2016-11-26 NOTE — ED Notes (Signed)
PICC line accessed, blood work drawn and NS hanging from PICC at this time

## 2016-11-26 NOTE — ED Provider Notes (Signed)
[facility] DEPT Provider Note   CSN: [id number] Arrival date & time: 11/26/16  [id number]     History   Chief Complaint Chief Complaint  Patient presents with  . Sickle Cell Pain Crisis    HPI Dawn [name] is a 36 y.o. female.  HPI  Dawn [name] is a 36 y.o. female who presents to the Emergency Department complaining of exacerbation of her sickle cell pain.  She complains of bilateral leg and arm pain which is similar to previous flares.  She was seen here 4 days ago for same.  She was recently admitted to the hospital and treated for an infection from her portacath which was subsequently removed.  She is currently receiving daily doses of antibiotics through a PICC line.   She denies shortness of breath, chest pain, fever, chills, vomiting, and dysuria.    Past Medical History:  Diagnosis Date  . Blood transfusion   . Demand ischemia ([place]) 02/06/2016  . HCAP (healthcare-associated pneumonia) 05/19/2013  . 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 Pulmonary hypertension 02/06/2016   49 mmHg per echo on 02/06/16   . 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 Sickle cell anemia (HCC)   . Sickle cell disease ([place])   . 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 Vitamin B12 deficiency 07/07/2015    Patient Active Problem List   Diagnosis Date Noted  . Sickle cell anemia with pain ([place]) 11/06/2016  . Syncope 10/17/2016  . Dizziness   . Hypomagnesemia   . Swelling of right upper extremity   . 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 Sickle cell crisis ([place]) 10/03/2016  . Positive D dimer   . Pulmonary hypertension 02/16/2016  . 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 Anemia of chronic disease   . Chronic pain syndrome 10/14/2015  . 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 Vitamin B12 deficiency 07/07/2015  . Thrombocytopenia ([place]) 07/06/2015  . Hyperbilirubinemia 09/11/2014  . Macrocytosis 06/13/2014  . Vitamin D deficiency 11/23/2013  . 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 Hypokalemia 03/28/2012  . 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 Elevated LFTs 04/27/2011  . Sickle cell disease ([place]) 04/26/2011    Past Surgical History:  Procedure Laterality Date  .  left knee ACL reconstruction    . CESAREAN SECTION     x 2  . CHOLECYSTECTOMY    . IR GENERIC HISTORICAL  07/17/2016   IR FLUORO GUIDE PORT INSERTION RIGHT 07/17/2016 Dawn [name] [facility]  . IR GENERIC HISTORICAL  07/17/2016   IR US GUIDE VASC ACCESS RIGHT 07/17/2016 Dawn [name] [facility]  . IR GENERIC HISTORICAL  10/12/2016   IR PTA VENOUS EXCEPT DIALYSIS CIRCUIT 10/12/2016 Dawn [name] [facility]  . IR GENERIC HISTORICAL  10/12/2016   IR US GUIDE VASC ACCESS RIGHT 10/12/2016 Dawn [name] [facility]  . IR GENERIC HISTORICAL  10/12/2016   IR VENO/EXT/UNI RIGHT 10/12/2016 Dawn [name] [facility]  . PORT-A-CATH REMOVAL Left 07/29/2013   Procedure: REMOVAL PORT-A-CATH;  Surgeon: Dawn [name];  Location: [facility];  Service: General;  Laterality: Left;  . PORT-A-CATH REMOVAL Right 11/14/2016   Procedure: REMOVAL PORT-A-CATH;  Surgeon: Dawn [name];  Location: [facility];  Service: General;  Laterality: Right;  . PORTACATH PLACEMENT    . PORTACATH PLACEMENT Left 02/23/2013   Procedure: INSERTION PORT-A-CATH;  Surgeon: Dawn [name];  Location: [facility];  Service: General;  Laterality: Left;  .  TEE WITHOUT CARDIOVERSION N/A 08/07/2013   Procedure: TRANSESOPHAGEAL ECHOCARDIOGRAM (TEE);  Surgeon: Dawn [name];  [facility];  Service: Cardiology;  Laterality: N/A;    OB History    Gravida Para Term Preterm AB Living   1 1   1   1    SAB TAB Ectopic Multiple Live Births                   Home Medications    Prior to Admission medications   Medication Sig Start Date End Date Taking? Authorizing Provider  aspirin EC 81 MG EC tablet Take 1 tablet (81 mg total) by mouth  daily. 10/18/16   Leana Gamer, [name]  ceFAZolin (ANCEF) 2-4 GM/100ML-% IVPB Inject 100 mLs (2 g total) into the vein every 8 (eight) hours. 11/21/16 12/19/16  Eber Jones, [name]  folic acid (FOLVITE) 1 MG tablet Take 1 tablet (1 mg total) by mouth daily. 03/14/16   Dawn [name]  hydroxyurea (HYDREA) 500 MG capsule Take 2 capsules (1,000 mg total) by mouth daily. May take with food to minimize GI side effects. 03/14/16   Dawn [name]  levonorgestrel (MIRENA) 20 MCG/24HR IUD 1 each by Intrauterine route once.    Historical Provider, [name]  loratadine (CLARITIN) 10 MG tablet Take 10 mg by mouth daily.    Historical Provider, [name]  morphine (MS CONTIN) 15 MG 12 hr tablet Take 1 tablet (15 mg total) by mouth every 12 (twelve) hours. 11/01/16   Dawn [name]  Multiple Vitamin (MULTIVITAMIN WITH MINERALS) TABS tablet Take 1 tablet by mouth daily.    Historical Provider, [name]  Oxycodone HCl 10 MG TABS Take 1 tablet (10 mg total) by mouth every 4 (four) hours as needed. 11/01/16   Dawn [name]  potassium chloride SA (K-DUR,KLOR-CON) 20 MEQ tablet Take 2 tablets (40 mEq total) by mouth 3 (three) times daily. 11/20/16   Eber Jones, [name]  promethazine (PHENERGAN) 12.5 MG tablet Take 1 tablet (12.5 mg total) by mouth every 6 (six) hours as needed for nausea or vomiting. 11/01/16   Dawn [name]  Vitamin D, Ergocalciferol, (DRISDOL) 50000 units CAPS capsule Take 1 capsule (50,000 Units total) by mouth every 7 (seven) days. Takes on Sundays. 06/01/16   Dawn [name]    Family History Family History  Problem Relation Age of Onset  . Sickle cell trait Mother   . Sickle cell trait Father   . Hypertension Father   . Diabetes Father   . Sickle cell trait Sister   . Cancer Paternal Aunt     Breast    Social History Social History  Substance Use Topics  . Smoking status: Never Smoker  . Smokeless tobacco: Never Used  . Alcohol use No     Allergies     Desferal [deferoxamine]; Latex; Lisinopril; Tape; and Tylenol [acetaminophen]   Review of Systems Review of Systems  Constitutional: Negative for appetite change, chills and fever.  HENT: Negative for sore throat and trouble swallowing.   Respiratory: Negative for cough and shortness of breath.   Cardiovascular: Negative for chest pain.  Gastrointestinal: Negative for abdominal pain, nausea and vomiting.  Genitourinary: Negative for decreased urine volume, difficulty urinating and dysuria.  Musculoskeletal: Positive for arthralgias and myalgias. Negative for back pain and neck pain.  Neurological: Negative for dizziness, weakness, numbness and headaches.     Physical Exam Updated Vital Signs BP 145/91  Pulse 81   Temp 98.1 F (36.7 C)   Resp 18   Ht 5\' 3"  (1.6 m)   Wt 73.9 kg   SpO2 99%   BMI 28.87 kg/m   Physical Exam  Constitutional: She is oriented to person, place, and time. She appears well-developed and well-nourished. No distress.  HENT:  Mouth/Throat: Oropharynx is clear and moist.  Eyes: Pupils are equal, round, and reactive to light. Scleral icterus is present.  Cardiovascular: Normal rate, regular rhythm and intact distal pulses.   PICC line left upper arm  Pulmonary/Chest: Effort normal and breath sounds normal. No respiratory distress. She exhibits no tenderness.  Abdominal: Soft. She exhibits no distension. There is no tenderness. There is no guarding.  Musculoskeletal: Normal range of motion. She exhibits no edema or deformity.  Moves all extremities well, no motor or sensory deficits on exam.  Neurological: She is alert and oriented to person, place, and time.  Skin: Skin is warm. No erythema.  Nursing note and vitals reviewed.    ED Treatments / Results  Labs (all labs ordered are listed, but only abnormal results are displayed) Labs Reviewed  COMPREHENSIVE METABOLIC PANEL - Abnormal; Notable for the following:       Result Value   Sodium 134  (*)    Potassium 3.4 (*)    BUN 5 (*)    Calcium 8.6 (*)    Albumin 2.6 (*)    AST 111 (*)    Total Bilirubin 9.8 (*)    All other components within normal limits  CBC WITH DIFFERENTIAL/PLATELET - Abnormal; Notable for the following:    WBC 3.6 (*)    RBC 2.86 (*)    Hemoglobin 10.2 (*)    HCT 28.3 (*)    MCH 35.7 (*)    RDW 21.2 (*)    All other components within normal limits  RETICULOCYTES - Abnormal; Notable for the following:    Retic Ct Pct 6.8 (*)    RBC. 2.86 (*)    Retic Count, Manual 194.5 (*)    All other components within normal limits    EKG  EKG Interpretation None       Radiology No results found.  Procedures Procedures (including critical care time)  Medications Ordered in ED Medications  ondansetron (ZOFRAN) injection 4 mg (4 mg Intravenous Given 11/26/16 1108)  HYDROmorphone (DILAUDID) injection 2 mg (2 mg Intravenous Given 11/26/16 1108)  0.9 %  sodium chloride infusion ( Intravenous Stopped 11/26/16 1138)  HYDROmorphone (DILAUDID) injection 2 mg (2 mg Intravenous Given 11/26/16 1145)  diphenhydrAMINE (BENADRYL) injection 25 mg (25 mg Intravenous Given 11/26/16 1145)  sodium chloride 0.9 % bolus 1,000 mL (0 mLs Intravenous Stopped 11/26/16 1338)  HYDROmorphone (DILAUDID) injection 2 mg (2 mg Intravenous Given 11/26/16 1242)  heparin lock flush 100 UNIT/ML injection (250 Units  Given 11/26/16 1338)     Initial Impression / Assessment and Plan / ED Course  I have reviewed the triage vital signs and the nursing notes.  Pertinent labs & imaging results that were available during my care of the patient were reviewed by me and considered in my medical decision making (see chart for details).     1315  Pt is feeling better after IVF's and IV pain medication, requesting discharge.  Labs reviewed retic count improved from previous.  No concerning sx's for new or worsening infection, NV intact. Discussed care plan with attending provider. Appears stable for d/c,  agrees to close f/u with sickle cell  clinic.  Return precautions discussed  Final Clinical Impressions(s) / ED Diagnoses   Final diagnoses:  Sickle cell pain crisis [facility])    New Prescriptions New Prescriptions   No medications on file     Kem Parkinson, PA-C 11/28/16 [place], [name] 11/29/16 [phone]

## 2016-11-26 NOTE — ED Triage Notes (Signed)
Pt c/o sickle cell pain since Saturday. Pt recently treated for sepsis and on IV antibiotics at home. Left arm PICC.

## 2016-11-28 ENCOUNTER — Encounter (HOSPITAL_COMMUNITY): Payer: Self-pay | Admitting: Emergency Medicine

## 2016-11-28 ENCOUNTER — Telehealth: Payer: Self-pay | Admitting: Family Medicine

## 2016-11-28 ENCOUNTER — Emergency Department (HOSPITAL_COMMUNITY)
Admission: EM | Admit: 2016-11-28 | Discharge: 2016-11-28 | Disposition: A | Discharge status: Home / Self Care | Payer: Medicare Other | Attending: Emergency Medicine | Admitting: Emergency Medicine

## 2016-11-28 DIAGNOSIS — D5700 Hb-SS disease with crisis, unspecified: Secondary | ICD-10-CM | POA: Diagnosis not present

## 2016-11-28 DIAGNOSIS — Z79899 Other long term (current) drug therapy: Secondary | ICD-10-CM | POA: Insufficient documentation

## 2016-11-28 DIAGNOSIS — Z7982 Long term (current) use of aspirin: Secondary | ICD-10-CM | POA: Diagnosis not present

## 2016-11-28 DIAGNOSIS — D57219 Sickle-cell/Hb-C disease with crisis, unspecified: Secondary | ICD-10-CM | POA: Diagnosis not present

## 2016-11-28 MED ORDER — HYDROMORPHONE HCL 2 MG/ML IJ SOLN: 2.0000 mg | INTRAMUSCULAR | Status: AC

## 2016-11-28 MED ORDER — KETOROLAC TROMETHAMINE 30 MG/ML IJ SOLN
30.0000 mg | INTRAMUSCULAR | Status: AC
Start: 2016-11-28 — End: 2016-11-28
  Administered 2016-11-28: 30 mg via INTRAVENOUS
  Filled 2016-11-28: qty 1

## 2016-11-28 MED ORDER — PROMETHAZINE HCL 25 MG/ML IJ SOLN
12.5000 mg | Freq: Once | INTRAMUSCULAR | Status: AC
  Administered 2016-11-28: 12.5 mg via INTRAVENOUS
  Filled 2016-11-28: qty 1

## 2016-11-28 MED ORDER — HYDROMORPHONE HCL 2 MG/ML IJ SOLN
2.0000 mg | INTRAMUSCULAR | Status: AC
  Administered 2016-11-28: 2 mg via INTRAVENOUS

## 2016-11-28 MED ORDER — HEPARIN SOD (PORK) LOCK FLUSH 100 UNIT/ML IV SOLN
INTRAVENOUS | Status: AC
  Administered 2016-11-28: 250 [IU]
  Filled 2016-11-28: qty 5

## 2016-11-28 MED ORDER — HYDROMORPHONE HCL 2 MG/ML IJ SOLN
2.0000 mg | Freq: Once | INTRAMUSCULAR | Status: AC
  Administered 2016-11-28: 2 mg via INTRAVENOUS
  Filled 2016-11-28: qty 1

## 2016-11-28 MED ORDER — HYDROMORPHONE HCL 2 MG/ML IJ SOLN
2.0000 mg | INTRAMUSCULAR | Status: AC
  Administered 2016-11-28: 2 mg via INTRAVENOUS
  Filled 2016-11-28: qty 1

## 2016-11-28 MED ORDER — HYDROMORPHONE HCL 2 MG/ML IJ SOLN
2.0000 mg | INTRAMUSCULAR | Status: DC
  Filled 2016-11-28: qty 1

## 2016-11-28 MED ORDER — DIPHENHYDRAMINE HCL 50 MG/ML IJ SOLN
25.0000 mg | Freq: Once | INTRAMUSCULAR | Status: AC
  Administered 2016-11-28: 25 mg via INTRAVENOUS
  Filled 2016-11-28: qty 1

## 2016-11-28 MED ORDER — ONDANSETRON HCL 4 MG/2ML IJ SOLN
4.0000 mg | INTRAMUSCULAR | Status: DC | PRN
  Administered 2016-11-28: 4 mg via INTRAVENOUS
  Filled 2016-11-28: qty 2

## 2016-11-28 MED ORDER — HYDROMORPHONE HCL 2 MG/ML IJ SOLN: 2.0000 mg | INTRAMUSCULAR | Status: DC

## 2016-11-28 MED ORDER — SODIUM CHLORIDE 0.45 % IV SOLN
INTRAVENOUS | Status: DC
  Administered 2016-11-28 (×2): via INTRAVENOUS

## 2016-11-28 NOTE — ED Provider Notes (Signed)
Medical screening examination/treatment/procedure(s) were conducted as a shared visit with non-physician practitioner(s) and myself.  I personally evaluated the patient during the encounter.   EKG Interpretation None       Results for orders placed or performed during the hospital encounter of 11/26/16  Comprehensive metabolic panel  Result Value Ref Range   Sodium 134 (L) 135 - 145 mmol/L   Potassium 3.4 (L) 3.5 - 5.1 mmol/L   Chloride 104 101 - 111 mmol/L   CO2 23 22 - 32 mmol/L   Glucose, Bld 88 65 - 99 mg/dL   BUN 5 (L) 6 - 20 mg/dL   Creatinine, Ser 0.53 0.44 - 1.00 mg/dL   Calcium 8.6 (L) 8.9 - 10.3 mg/dL   Total Protein 7.8 6.5 - 8.1 g/dL   Albumin 2.6 (L) 3.5 - 5.0 g/dL   AST 111 (H) 15 - 41 U/L   ALT 25 14 - 54 U/L   Alkaline Phosphatase 123 38 - 126 U/L   Total Bilirubin 9.8 (H) 0.3 - 1.2 mg/dL   GFR calc non Af Amer >60 >60 mL/min   GFR calc Af Amer >60 >60 mL/min   Anion gap 7 5 - 15  CBC with Differential  Result Value Ref Range   WBC 3.6 (L) 4.0 - 10.5 K/uL   RBC 2.86 (L) 3.87 - 5.11 MIL/uL   Hemoglobin 10.2 (L) 12.0 - 15.0 g/dL   HCT 28.3 (L) 36.0 - 46.0 %   MCV 99.0 78.0 - 100.0 fL   MCH 35.7 (H) 26.0 - 34.0 pg   MCHC 36.0 30.0 - 36.0 g/dL   RDW 21.2 (H) 11.5 - 15.5 %   Platelets 339 150 - 400 K/uL   Neutrophils Relative % 49 %   Neutro Abs 1.8 1.7 - 7.7 K/uL   Lymphocytes Relative 37 %   Lymphs Abs 1.3 0.7 - 4.0 K/uL   Monocytes Relative 13 %   Monocytes Absolute 0.5 0.1 - 1.0 K/uL   Eosinophils Relative 1 %   Eosinophils Absolute 0.0 0.0 - 0.7 K/uL   Basophils Relative 1 %   Basophils Absolute 0.0 0.0 - 0.1 K/uL  Reticulocytes  Result Value Ref Range   Retic Ct Pct 6.8 (H) 0.4 - 3.1 %   RBC. 2.86 (L) 3.87 - 5.11 MIL/uL   Retic Count, Manual 194.5 (H) 19.0 - 186.0 K/uL    Patient seen by me along with physician assistant. The patient has a history of sickle cell disease. Patient returns today with same complaint she had on March 5. Patient was  also seen March 1 for sickle cell pain. Labs on the fifth without significant and her maladies. Patient was admitted the end of February for sepsis. Patient's discharged home on Ancef about 10 day course through a PICC line. Patient received her last dose of Ancef this morning. Patient has not had any fevers since being home. Patient's complaint is typical pain for her and her arms and legs. Patient has been seen by the wellness clinic since discharge from the hospital but has not been seen since her ED visit on March 5.  We'll treat her with her standard protocol pain control meds. Patient appears nontoxic no acute distress. PICC line left arm. Her Port-A-Cath has been removed from her right chest. Lungs are clear bilaterally. Abdomen soft nontender heart regular. Mental status is normal. No significant neuro exam abnormalities.   Fredia Sorrow, MD 11/28/16 [phone]

## 2016-11-28 NOTE — ED Provider Notes (Signed)
[facility] DEPT Provider Note   CSN: [id number] Arrival date & time: 11/28/16  0802     History   Chief Complaint Chief Complaint  Patient presents with  . Sickle Cell Pain Crisis    HPI Dawn [name] is a 36 y.o. female with history of sickle cell anemia who presents with sickle cell pain crisis. Patient was seen 2 days ago for similar symptoms. Patient reports pain in bilateral extremities, typical of her sickle cell pain crises in the past. Patient has been taking Ancef at home through a PICC line for sepsis. She took her last dose at 7 AM today. She has been afebrile. She denies any chest pain, shortness of breath, abdominal pain, nausea, vomiting, urinary symptoms. She has been taking her at home medications including morphine 15 mg twice daily, oxycodone 5 mg every 4 hours as needed, and hydroxyurea without relief.  HPI  Past Medical History:  Diagnosis Date  . Blood transfusion   . Demand ischemia ([place]) 02/06/2016  . HCAP (healthcare-associated pneumonia) 05/19/2013  . 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 Pulmonary hypertension 02/06/2016   49 mmHg per echo on 02/06/16   . 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 Sickle cell anemia (H[facility])   . Sickle cell disease ([place])   . 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 Vitamin B12 deficiency 07/07/2015    Patient Active Problem List   Diagnosis Date Noted  . Sickle cell anemia with pain ([place]) 11/06/2016  . Syncope 10/17/2016  . Dizziness   . Hypomagnesemia   . Swelling of right upper extremity   . 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 Sickle cell crisis ([place]) 10/03/2016  . Positive D dimer   . Pulmonary hypertension 02/16/2016  . 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 Anemia of chronic disease   . Chronic pain syndrome 10/14/2015  . 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 Vitamin B12 deficiency 07/07/2015  . Thrombocytopenia ([place]) 07/06/2015  . Hyperbilirubinemia 09/11/2014  . Macrocytosis 06/13/2014  . Vitamin D  deficiency 11/23/2013  . 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 Hypokalemia 03/28/2012  . 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 Elevated LFTs 04/27/2011  . Sickle cell disease ([place]) 04/26/2011    Past Surgical History:  Procedure Laterality Date  .  left knee ACL reconstruction    . CESAREAN SECTION     x 2  . CHOLECYSTECTOMY    . IR GENERIC HISTORICAL  07/17/2016   IR FLUORO GUIDE PORT INSERTION RIGHT 07/17/2016 Aletta Edouard, MD [facility]  . IR GENERIC HISTORICAL  07/17/2016   IR US GUIDE VASC ACCESS RIGHT 07/17/2016 Aletta Edouard, MD [facility]  . IR GENERIC HISTORICAL  10/12/2016   IR PTA VENOUS EXCEPT DIALYSIS CIRCUIT 10/12/2016 Arne Cleveland, MD [facility]  . IR GENERIC HISTORICAL  10/12/2016   IR US GUIDE VASC ACCESS RIGHT 10/12/2016 Arne Cleveland, MD [facility]  . IR GENERIC HISTORICAL  10/12/2016   IR VENO/EXT/UNI RIGHT 10/12/2016 Arne Cleveland, MD [facility]  . PORT-A-CATH REMOVAL Left 07/29/2013   Procedure: REMOVAL PORT-A-CATH;  Surgeon: Scherry Ran, MD;  Location: [facility];  Service: General;  Laterality: Left;  . PORT-A-CATH REMOVAL Right 11/14/2016   Procedure: REMOVAL PORT-A-CATH;  Surgeon: Aviva Signs, MD;  Location: [facility];  Service: General;  Laterality: Right;  . PORTACATH PLACEMENT    . PORTACATH PLACEMENT Left 02/23/2013   Procedure: INSERTION PORT-A-CATH;  Surgeon: Donato Heinz, MD;  Location: [facility];  Service: General;  Laterality: Left;  . TEE WITHOUT CARDIOVERSION N/A 08/07/2013   Procedure: TRANSESOPHAGEAL ECHOCARDIOGRAM (TEE);  Surgeon: Arnoldo Lenis, MD;  Location: [facility];  Service: Cardiology;  Laterality: N/A;    OB History    Gravida Para Term Preterm AB Living   1 1   1   1    SAB TAB Ectopic Multiple Live Births                   Home Medications    Prior to Admission medications   Medication Sig Start Date End Date Taking?  Authorizing Provider  aspirin EC 81 MG EC tablet Take 1 tablet (81 mg total) by mouth daily. 10/18/16  Yes Leana Gamer, MD  ceFAZolin (ANCEF) 2-4 GM/100ML-% IVPB Inject 100 mLs (2 g total) into the vein every 8 (eight) hours. 11/21/16 12/19/16 Yes Eber Jones, MD  folic acid (FOLVITE) 1 MG tablet Take 1 tablet (1 mg total) by mouth daily. 03/14/16  Yes [name], FNP  hydroxyurea (HYDREA) 500 MG capsule Take 2 capsules (1,000 mg total) by mouth daily. May take with food to minimize GI side effects. 03/14/16  Yes [name], FNP  loratadine (CLARITIN) 10 MG tablet Take 10 mg by mouth daily.   Yes Historical Provider, MD  morphine (MS CONTIN) 15 MG 12 hr tablet Take 1 tablet (15 mg total) by mouth every 12 (twelve) hours. 11/01/16  Yes Tresa Garter, MD  Multiple Vitamin (MULTIVITAMIN WITH MINERALS) TABS tablet Take 1 tablet by mouth daily.   Yes Historical Provider, MD  Oxycodone HCl 10 MG TABS Take 1 tablet (10 mg total) by mouth every 4 (four) hours as needed. 11/01/16  Yes Tresa Garter, MD  promethazine (PHENERGAN) 12.5 MG tablet Take 1 tablet (12.5 mg total) by mouth every 6 (six) hours as needed for nausea or vomiting. 11/01/16  Yes Tresa Garter, MD  Vitamin D, Ergocalciferol, (DRISDOL) 50000 units CAPS capsule Take 1 capsule (50,000 Units total) by mouth every 7 (seven) days. Takes on Sundays. 06/01/16  Yes Micheline Chapman, NP  levonorgestrel (MIRENA) 20 MCG/24HR IUD 1 each by Intrauterine route once.    Historical Provider, MD  potassium chloride SA (K-DUR,KLOR-CON) 20 MEQ tablet Take 2 tablets (40 mEq total) by mouth 3 (three) times daily. Patient not taking: Reported on 11/28/2016 11/20/16   Eber Jones, MD    Family History Family History  Problem Relation Age of Onset  . Sickle cell trait Mother   . Sickle cell trait Father   . Hypertension Father   . Diabetes Father   . Sickle cell trait Sister   . Cancer Paternal Aunt     Breast     Social History Social History  Substance Use Topics  . Smoking status: Never Smoker  . Smokeless tobacco: Never Used  . Alcohol use No     Allergies   Desferal [deferoxamine]; Latex; Lisinopril; Tape; and Tylenol [acetaminophen]   Review of Systems Review of Systems  Constitutional: Negative for chills and fever.  HENT: Negative for facial swelling and sore throat.   Respiratory: Negative for shortness of breath.   Cardiovascular: Negative for chest pain.  Gastrointestinal: Negative for abdominal pain, nausea and vomiting.  Genitourinary: Negative for dysuria.  Musculoskeletal: Positive for myalgias. Negative for back pain and neck pain.  Skin: Negative for rash and wound.  Neurological: Negative for headaches.  Psychiatric/Behavioral: The patient is not nervous/anxious.  Physical Exam Updated Vital Signs BP 140/91   Pulse 90   Temp 98.5 F (36.9 C) (Oral)   Resp 20   Ht 5\' 3"  (1.6 m)   Wt 73.9 kg   SpO2 96%   BMI 28.87 kg/m   Physical Exam  Constitutional: She appears well-developed and well-nourished. No distress.  HENT:  Head: Normocephalic and atraumatic.  Mouth/Throat: Oropharynx is clear and moist. No oropharyngeal exudate.  Eyes: Conjunctivae are normal. Pupils are equal, round, and reactive to light. Right eye exhibits no discharge. Left eye exhibits no discharge. No scleral icterus.  Scleral icterus  Neck: Normal range of motion. Neck supple. No thyromegaly present.  Cardiovascular: Normal rate, regular rhythm, normal heart sounds and intact distal pulses.  Exam reveals no gallop and no friction rub.   No murmur heard. Pulmonary/Chest: Effort normal and breath sounds normal. No stridor. No respiratory distress. She has no wheezes. She has no rales. She exhibits no tenderness.  Abdominal: Soft. Bowel sounds are normal. She exhibits no distension. There is no tenderness. There is no rebound and no guarding.  Musculoskeletal: She exhibits no edema.   Palpation to extremities does not exacerbate pain  Lymphadenopathy:    She has no cervical adenopathy.  Neurological: She is alert. Coordination normal.  Skin: Skin is warm and dry. No rash noted. She is not diaphoretic. No pallor.  Psychiatric: She has a normal mood and affect.  Nursing note and vitals reviewed.    ED Treatments / Results  Labs (all labs ordered are listed, but only abnormal results are displayed) Labs Reviewed - No data to display  EKG  EKG Interpretation None       Radiology No results found.  Procedures Procedures (including critical care time)  Medications Ordered in ED Medications  0.45 % sodium chloride infusion ( Intravenous Stopped 11/28/16 1350)  ondansetron (ZOFRAN) injection 4 mg (4 mg Intravenous Given 11/28/16 0847)  ketorolac (TORADOL) 30 MG/ML injection 30 mg (30 mg Intravenous Given 11/28/16 0847)  HYDROmorphone (DILAUDID) injection 2 mg (2 mg Intravenous Given 11/28/16 0847)    Or  HYDROmorphone (DILAUDID) injection 2 mg ( Subcutaneous See Alternative 11/28/16 0847)  HYDROmorphone (DILAUDID) injection 2 mg (2 mg Intravenous Given 11/28/16 0945)    Or  HYDROmorphone (DILAUDID) injection 2 mg ( Subcutaneous See Alternative 11/28/16 0945)  diphenhydrAMINE (BENADRYL) injection 25 mg (25 mg Intravenous Given 11/28/16 0847)  HYDROmorphone (DILAUDID) injection 2 mg (2 mg Intravenous Given 11/28/16 1137)  HYDROmorphone (DILAUDID) injection 2 mg (2 mg Intravenous Given 11/28/16 1257)  promethazine (PHENERGAN) injection 12.5 mg (12.5 mg Intravenous Given 11/28/16 1257)  heparin lock flush 100 UNIT/ML injection (250 Units  Given 11/28/16 1352)     Initial Impression / Assessment and Plan / ED Course  I have reviewed the triage vital signs and the nursing notes.  Pertinent labs & imaging results that were available during my care of the patient were reviewed by me and considered in my medical decision making (see chart for details).     1000 On re-evaluation  after 1 dose of Dilaudid 2mg , patient's pain is unchanged. Will proceed with next dose and re-evaluate.  1045  On re-evaluation after 2 doses of Dilaudid 2mg , patient's pain went from 8 to 7. Will proceed with next dose and re-evaluate. Request for phenergan, as Zofran has not been helping nausea and patient takes Phenergan at home.  1220 On reevaluation after 3 doses of Dilaudid 2 mg, patient's pain went from 7-6. Patient states she  has needed 4 doses in the past (confimed in medical records) and thinks she should feel well enough to go home after fourth dose. Will proceed with fourth dose and reevaluate. Patient is eating [employer] on evaluation.  1330 On reevaluation after fourth dose of Dilaudid 2 mg, patient's pain went from 6 to 5/4. Patient states she is feeling well enough to go home to manage her pain at home. Patient's nausea resolved with Phenergan as well.  Patient with continued sickle cell pain crisis. Labs not drawn today because stable 2 days ago. Pain treated inpatient is feeling well enough to manage her pain at home. Follow-up to sickle cell center. Return precautions discussed. Patient understands and agrees with plan. Patient vitals stable throughout ED course and discharged in satisfactory condition. Patient also evaluated by Dr. Rogene Houston who got in the patient's management and agrees with plan.  Final Clinical Impressions(s) / ED Diagnoses   Final diagnoses:  Sickle cell pain crisis [facility])    New Prescriptions Discharge Medication List as of 11/28/2016  1:22 PM       Frederica Kuster, PA-C 11/28/16 1657    Fredia Sorrow, MD 11/29/16 3545

## 2016-11-28 NOTE — Telephone Encounter (Signed)
Kathlee Nations from [employer] called the office to speak with PCP regarding pt. Pt's IV anitobiotic are done, and Kathlee Nations needs an order to take the line out. Informed Kathlee Nations that Britany is a patient of [facility] but they asked them to contact us and forward message to Dr. Doreene Burke. Please follow up.  Thank you

## 2016-11-28 NOTE — Discharge Instructions (Signed)
Take your at home medications as prescribed. Please follow-up with your primary care provider at the sickle cell center for follow-up of today's visit and further evaluation and treatment of your current crisis. Please return to the emergency department if you develop any new or worsening symptoms including chest pain, shortness of breath, or any other concerning symptoms.

## 2016-11-28 NOTE — ED Triage Notes (Signed)
Pt states that she is having a sickle cell crisis.  Pt states that symptoms worsened at about 1800 last night.  She states that she is having pain in her arms and legs.  Pt has a PICC line and has been taking Ancef at home and her last dose was this 0700.

## 2016-11-28 NOTE — Telephone Encounter (Signed)
Patient verified DOB MA confirmed with Shruti from [facility] becoming infected and a pic line being used. PCP gave the "okay" to remove the line since antibiotics are completed. No further questions at this time.

## 2016-11-30 DIAGNOSIS — B9561 Methicillin susceptible Staphylococcus aureus infection as the cause of diseases classified elsewhere: Secondary | ICD-10-CM | POA: Diagnosis not present

## 2016-11-30 DIAGNOSIS — Z452 Encounter for adjustment and management of vascular access device: Secondary | ICD-10-CM | POA: Diagnosis not present

## 2016-11-30 DIAGNOSIS — I272 Pulmonary hypertension, unspecified: Secondary | ICD-10-CM | POA: Diagnosis not present

## 2016-11-30 DIAGNOSIS — D638 Anemia in other chronic diseases classified elsewhere: Secondary | ICD-10-CM | POA: Diagnosis not present

## 2016-11-30 DIAGNOSIS — T80211A Bloodstream infection due to central venous catheter, initial encounter: Secondary | ICD-10-CM | POA: Diagnosis not present

## 2016-11-30 DIAGNOSIS — D5700 Hb-SS disease with crisis, unspecified: Secondary | ICD-10-CM | POA: Diagnosis not present

## 2016-12-01 ENCOUNTER — Emergency Department (HOSPITAL_COMMUNITY)
Admission: EM | Admit: 2016-12-01 | Discharge: 2016-12-01 | Disposition: A | Discharge status: Home / Self Care | Payer: Medicare Other | Attending: Emergency Medicine | Admitting: Emergency Medicine

## 2016-12-01 ENCOUNTER — Encounter (HOSPITAL_COMMUNITY): Payer: Self-pay | Admitting: Emergency Medicine

## 2016-12-01 DIAGNOSIS — D5700 Hb-SS disease with crisis, unspecified: Secondary | ICD-10-CM | POA: Insufficient documentation

## 2016-12-01 DIAGNOSIS — Z7982 Long term (current) use of aspirin: Secondary | ICD-10-CM | POA: Insufficient documentation

## 2016-12-01 DIAGNOSIS — Z79899 Other long term (current) drug therapy: Secondary | ICD-10-CM | POA: Diagnosis not present

## 2016-12-01 DIAGNOSIS — M79602 Pain in left arm: Secondary | ICD-10-CM | POA: Diagnosis present

## 2016-12-01 DIAGNOSIS — Z91040 Latex allergy status: Secondary | ICD-10-CM | POA: Diagnosis not present

## 2016-12-01 LAB — CBC WITH DIFFERENTIAL/PLATELET
Basophils Absolute: 0 10*3/uL (ref 0.0–0.1)
Basophils Relative: 0 %
EOS PCT: 3 %
Eosinophils Absolute: 0.2 10*3/uL (ref 0.0–0.7)
HEMATOCRIT: 25.1 % — AB (ref 36.0–46.0)
Hemoglobin: 9.1 g/dL — ABNORMAL LOW (ref 12.0–15.0)
LYMPHS ABS: 1.9 10*3/uL (ref 0.7–4.0)
LYMPHS PCT: 23 %
MCH: 35.5 pg — ABNORMAL HIGH (ref 26.0–34.0)
MCHC: 36.3 g/dL — ABNORMAL HIGH (ref 30.0–36.0)
MCV: 98 fL (ref 78.0–100.0)
Monocytes Absolute: 1.3 10*3/uL — ABNORMAL HIGH (ref 0.1–1.0)
Monocytes Relative: 16 %
NEUTROS ABS: 4.6 10*3/uL (ref 1.7–7.7)
Neutrophils Relative %: 57 %
PLATELETS: 221 10*3/uL (ref 150–400)
RBC: 2.56 MIL/uL — AB (ref 3.87–5.11)
RDW: 21.2 % — ABNORMAL HIGH (ref 11.5–15.5)
WBC: 8 10*3/uL (ref 4.0–10.5)

## 2016-12-01 LAB — BASIC METABOLIC PANEL
Anion gap: 7 (ref 5–15)
BUN: 7 mg/dL (ref 6–20)
CO2: 24 mmol/L (ref 22–32)
Calcium: 8.5 mg/dL — ABNORMAL LOW (ref 8.9–10.3)
Chloride: 106 mmol/L (ref 101–111)
Creatinine, Ser: 0.46 mg/dL (ref 0.44–1.00)
GFR calc Af Amer: >60 mL/min (ref >60)
GFR calc non Af Amer: >60 mL/min (ref >60)
GLUCOSE: 79 mg/dL (ref 65–99)
POTASSIUM: 3.5 mmol/L (ref 3.5–5.1)
Sodium: 137 mmol/L (ref 135–145)

## 2016-12-01 LAB — RETICULOCYTES
RBC.: 2.56 MIL/uL — ABNORMAL LOW (ref 3.87–5.11)
RETIC COUNT ABSOLUTE: 230.4 10*3/uL — AB (ref 19.0–186.0)
Retic Ct Pct: 9 % — ABNORMAL HIGH (ref 0.4–3.1)

## 2016-12-01 MED ORDER — SODIUM CHLORIDE 0.45 % IV SOLN
INTRAVENOUS | Status: DC
  Administered 2016-12-01: 12:00:00 via INTRAVENOUS

## 2016-12-01 MED ORDER — HYDROMORPHONE HCL 2 MG/ML IJ SOLN: 2.0000 mg | INTRAMUSCULAR | Status: DC

## 2016-12-01 MED ORDER — HYDROMORPHONE HCL 2 MG/ML IJ SOLN
2.0000 mg | INTRAMUSCULAR | Status: AC
  Administered 2016-12-01: 2 mg via INTRAVENOUS
  Filled 2016-12-01: qty 1

## 2016-12-01 MED ORDER — SODIUM CHLORIDE 0.9 % IV BOLUS (SEPSIS): 1000.0000 mL | Freq: Once | INTRAVENOUS | Status: DC

## 2016-12-01 MED ORDER — PROMETHAZINE HCL 25 MG/ML IJ SOLN
12.5000 mg | Freq: Once | INTRAMUSCULAR | Status: AC
  Administered 2016-12-01: 25 mg via INTRAVENOUS
  Filled 2016-12-01: qty 1

## 2016-12-01 MED ORDER — HYDROMORPHONE HCL 1 MG/ML IJ SOLN
1.0000 mg | Freq: Once | INTRAMUSCULAR | Status: AC
  Administered 2016-12-01: 1 mg via INTRAVENOUS
  Filled 2016-12-01: qty 1

## 2016-12-01 MED ORDER — SODIUM CHLORIDE 0.9 % IV SOLN: INTRAVENOUS | Status: DC

## 2016-12-01 MED ORDER — DIPHENHYDRAMINE HCL 50 MG/ML IJ SOLN
25.0000 mg | Freq: Once | INTRAMUSCULAR | Status: AC
  Administered 2016-12-01: 25 mg via INTRAVENOUS
  Filled 2016-12-01: qty 1

## 2016-12-01 MED ORDER — ONDANSETRON HCL 4 MG/2ML IJ SOLN: 4.0000 mg | INTRAMUSCULAR | Status: DC | PRN

## 2016-12-01 MED ORDER — HYDROMORPHONE HCL 2 MG/ML IJ SOLN: 2.0000 mg | INTRAMUSCULAR | Status: AC

## 2016-12-01 NOTE — ED Triage Notes (Signed)
Pt complains of sickle cell pain- followed by Dr Doreene Burke

## 2016-12-01 NOTE — Discharge Instructions (Signed)
Follow-up with sickle cell clinic. Return for any new or worse symptoms. Continue your current pain control medications.

## 2016-12-01 NOTE — ED Provider Notes (Signed)
[facility] DEPT Provider Note   CSN: [id number] Arrival date & time: 12/01/16  1100  By signing my name below, I, [name], attest that this documentation has been prepared under the direction and in the presence of Fredia Sorrow, MD . Electronically Signed: [name], Scribe. 12/01/2016. 11:53 AM.  History   Chief Complaint Chief Complaint  Patient presents with  . Sickle Cell Pain Crisis   The history is provided by the patient. No language interpreter was used.    HPI Comments: Dawn [name] is a 36 y.o. female with PMHx of type S sickle cell disease, who presents to the Emergency Department complaining of gradually worsening, bilateral upper and lower extremity pain consistent with her typical sickle cell pain that began ~2 days ago. Pt has visited the ED four times within this month on March 1, 5, 7, and 10 for similar sickle cell pain. She notes she recently had a follow-up appointment with the [facility] in which her dosage of Hydroxyurea medication was increased. She states she normally takes 500 mg Hydroxyurea, 15 mg Morphine, and 5 mg Oxycodone as needed to manage her pain but has taken these medications with no relief. Pt reports associated subjective fever 2 days ago that has now resolved. She denies any shortness of breath, back pain, or headache.   Past Medical History:  Diagnosis Date  . Blood transfusion   . Demand ischemia ([place]) 02/06/2016  . HCAP (healthcare-associated pneumonia) 05/19/2013  . 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 Pulmonary hypertension 02/06/2016   49 mmHg per echo on 02/06/16   . 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 Sickle cell anemia (HCC)   . Sickle cell disease ([place])   . Sickle cell disease, type S ([place])   . Vitamin B12 deficiency 07/07/2015    Patient Active Problem List   Diagnosis Date Noted  . Sickle cell anemia with pain ([place]) 11/06/2016  . Syncope 10/17/2016  . Dizziness   .  Hypomagnesemia   . Swelling of right upper extremity   . 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 Sickle cell crisis ([place]) 10/03/2016  . Positive D dimer   . Pulmonary hypertension 02/16/2016  . 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 Anemia of chronic disease   . Chronic pain syndrome 10/14/2015  . 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 Vitamin B12 deficiency 07/07/2015  . Thrombocytopenia ([place]) 07/06/2015  . Hyperbilirubinemia 09/11/2014  . Macrocytosis 06/13/2014  . Vitamin D deficiency 11/23/2013  . 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 Hypokalemia 03/28/2012  . 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 Elevated LFTs 04/27/2011  . Sickle cell disease ([place]) 04/26/2011    Past Surgical History:  Procedure Laterality Date  .  left knee ACL reconstruction    . CESAREAN SECTION     x 2  . CHOLECYSTECTOMY    . IR GENERIC HISTORICAL  07/17/2016   IR FLUORO GUIDE PORT INSERTION RIGHT 07/17/2016 Aletta Edouard, MD [facility]  . IR GENERIC HISTORICAL  07/17/2016   IR US GUIDE VASC ACCESS RIGHT 07/17/2016 Aletta Edouard, MD [facility]  . IR GENERIC HISTORICAL  10/12/2016   IR PTA VENOUS EXCEPT DIALYSIS CIRCUIT 10/12/2016 Arne Cleveland, MD [facility]  . IR GENERIC HISTORICAL  10/12/2016   IR US GUIDE VASC ACCESS RIGHT 10/12/2016 Arne Cleveland, MD [facility]  . IR GENERIC HISTORICAL  10/12/2016   IR VENO/EXT/UNI RIGHT 10/12/2016 Arne Cleveland, MD [facility]  .  PORT-A-CATH REMOVAL Left 07/29/2013   Procedure: REMOVAL PORT-A-CATH;  Surgeon: Scherry Ran, MD;  Location: [facility];  Service: General;  Laterality: Left;  . PORT-A-CATH REMOVAL Right 11/14/2016   Procedure: REMOVAL PORT-A-CATH;  Surgeon: Aviva Signs, MD;  Location: [facility];  Service: General;  Laterality: Right;  . PORTACATH PLACEMENT    . PORTACATH PLACEMENT Left 02/23/2013   Procedure: INSERTION PORT-A-CATH;  Surgeon: Donato Heinz, MD;   Location: [facility];  Service: General;  Laterality: Left;  . TEE WITHOUT CARDIOVERSION N/A 08/07/2013   Procedure: TRANSESOPHAGEAL ECHOCARDIOGRAM (TEE);  Surgeon: Arnoldo Lenis, MD;  Location[facility];  Service: Cardiology;  Laterality: N/A;    OB History    Gravida Para Term Preterm AB Living   1 1   1   1    SAB TAB Ectopic Multiple Live Births                 Home Medications    Prior to Admission medications   Medication Sig Start Date End Date Taking? Authorizing Provider  aspirin EC 81 MG EC tablet Take 1 tablet (81 mg total) by mouth daily. 10/18/16  Yes Leana Gamer, MD  ceFAZolin (ANCEF) 2-4 GM/100ML-% IVPB Inject 100 mLs (2 g total) into the vein every 8 (eight) hours. 11/21/16 12/19/16 Yes Eber Jones, MD  folic acid (FOLVITE) 1 MG tablet Take 1 tablet (1 mg total) by mouth daily. 03/14/16  Yes [name], FNP  hydroxyurea (HYDREA) 500 MG capsule Take 2 capsules (1,000 mg total) by mouth daily. May take with food to minimize GI side effects. 03/14/16  Yes [name], FNP  levonorgestrel (MIRENA) 20 MCG/24HR IUD 1 each by Intrauterine route once.   Yes Historical Provider, MD  loratadine (CLARITIN) 10 MG tablet Take 10 mg by mouth daily.   Yes Historical Provider, MD  morphine (MS CONTIN) 15 MG 12 hr tablet Take 1 tablet (15 mg total) by mouth every 12 (twelve) hours. 11/01/16  Yes Tresa Garter, MD  Multiple Vitamin (MULTIVITAMIN WITH MINERALS) TABS tablet Take 1 tablet by mouth daily.   Yes Historical Provider, MD  Oxycodone HCl 10 MG TABS Take 1 tablet (10 mg total) by mouth every 4 (four) hours as needed. 11/01/16  Yes Tresa Garter, MD  Vitamin D, Ergocalciferol, (DRISDOL) 50000 units CAPS capsule Take 1 capsule (50,000 Units total) by mouth every 7 (seven) days. Takes on Sundays. 06/01/16  Yes Micheline Chapman, NP  promethazine (PHENERGAN) 12.5 MG tablet Take 1 tablet (12.5 mg total) by mouth every 6 (six) hours as needed for nausea or  vomiting. 11/01/16   Tresa Garter, MD    Family History Family History  Problem Relation Age of Onset  . Sickle cell trait Mother   . Sickle cell trait Father   . Hypertension Father   . Diabetes Father   . Sickle cell trait Sister   . Cancer Paternal Aunt     Breast    Social History Social History  Substance Use Topics  . Smoking status: Never Smoker  . Smokeless tobacco: Never Used  . Alcohol use No   Allergies   Desferal [deferoxamine]; Latex; Lisinopril; Tape; and Tylenol [acetaminophen]  Review of Systems Review of Systems  Constitutional: Positive for fever.  HENT: Negative for congestion, rhinorrhea and sore throat.   Eyes: Negative for visual disturbance.  Respiratory: Negative for cough and shortness of breath.   Cardiovascular: Negative for chest pain and  leg swelling.  Gastrointestinal: Negative for abdominal pain, diarrhea, nausea and vomiting.  Genitourinary: Negative for dysuria.  Musculoskeletal: Positive for myalgias. Negative for back pain.  Skin: Negative for rash.  Neurological: Negative for headaches.  Hematological: Does not bruise/bleed easily.  All other systems reviewed and are negative.  Physical Exam Updated Vital Signs BP (!) 139/106 (BP Location: Right Arm)   Pulse 106   Temp 98.9 F (37.2 C) (Oral)   Resp 18   Ht 5\' 3"  (1.6 m)   Wt 163 lb (73.9 kg)   SpO2 95%   BMI 28.87 kg/m   Physical Exam  Constitutional: She is oriented to person, place, and time.  HENT:  Head: Normocephalic and atraumatic.  Mouth/Throat: Oropharynx is clear and moist.  Moist mucous membranes   Eyes: EOM are normal. Pupils are equal, round, and reactive to light. No scleral icterus.  Eyes are tracking normally  Cardiovascular: Normal rate, regular rhythm and normal heart sounds.   Pulmonary/Chest: Effort normal and breath sounds normal.  Lungs clear to auscultation bilaterally   Abdominal: Bowel sounds are normal. There is no tenderness.    Musculoskeletal: She exhibits no edema, tenderness or deformity.  No swelling in ankles   Neurological: She is alert and oriented to person, place, and time. Coordination normal.  Skin: No rash noted. No erythema. No pallor.   ED Treatments / Results  DIAGNOSTIC STUDIES:  Oxygen Saturation is 95% on RA, normal by my interpretation.    COORDINATION OF CARE:  11:50 AM Discussed treatment plan with pt at bedside and pt agreed to plan.  Labs (all labs ordered are listed, but only abnormal results are displayed) Labs Reviewed  CBC WITH DIFFERENTIAL/PLATELET - Abnormal; Notable for the following:       Result Value   RBC 2.56 (*)    Hemoglobin 9.1 (*)    HCT 25.1 (*)    MCH 35.5 (*)    MCHC 36.3 (*)    RDW 21.2 (*)    Monocytes Absolute 1.3 (*)    All other components within normal limits  BASIC METABOLIC PANEL - Abnormal; Notable for the following:    Calcium 8.5 (*)    All other components within normal limits  RETICULOCYTES - Abnormal; Notable for the following:    Retic Ct Pct 9.0 (*)    RBC. 2.56 (*)    Retic Count, Manual 230.4 (*)    All other components within normal limits    EKG  EKG Interpretation None       Radiology No results found.  Procedures Procedures (including critical care time)  Medications Ordered in ED Medications  0.45 % sodium chloride infusion ( Intravenous New Bag/Given 12/01/16 1218)  ondansetron (ZOFRAN) injection 4 mg (not administered)  HYDROmorphone (DILAUDID) injection 2 mg (not administered)  HYDROmorphone (DILAUDID) injection 2 mg (2 mg Intravenous Given 12/01/16 1218)  diphenhydrAMINE (BENADRYL) injection 25 mg (25 mg Intravenous Given 12/01/16 1219)  promethazine (PHENERGAN) injection 12.5 mg (25 mg Intravenous Given 12/01/16 1219)  HYDROmorphone (DILAUDID) injection 1 mg (1 mg Intravenous Given 12/01/16 1400)  HYDROmorphone (DILAUDID) injection 1 mg (1 mg Intravenous Given 12/01/16 1511)    Initial Impression / Assessment and  Plan / ED Course  I have reviewed the triage vital signs and the nursing notes.  Pertinent labs & imaging results that were available during my care of the patient were reviewed by me and considered in my medical decision making (see chart for details).     Patient  with known sickle cell disease. Patient seen by me on March 7 as well. At that time she did not have any labs she had labs from March 5. Labs today showed no significant abnormalities. Patient the pain controlled with sickle cell protocol here. Patient feeling better overall. Patient will follow-up with sickle cell clinic. Patient nontoxic no acute distress.  I personally performed the services described in this documentation, which was scribed in my presence. The recorded information has been reviewed and is accurate.     Final Clinical Impressions(s) / ED Diagnoses   Final diagnoses:  Sickle cell pain crisis [facility])    New Prescriptions New Prescriptions   No medications on file     Fredia Sorrow, MD 12/01/16 1527

## 2016-12-01 NOTE — ED Triage Notes (Signed)
Pt reports sickle cell pain all over.  Recently septic from port, which was removed and has been having frequent bouts with sickle cell crisis.

## 2016-12-02 ENCOUNTER — Encounter (HOSPITAL_COMMUNITY): Payer: Self-pay | Admitting: Emergency Medicine

## 2016-12-02 ENCOUNTER — Emergency Department (HOSPITAL_COMMUNITY)
Admission: EM | Admit: 2016-12-02 | Discharge: 2016-12-02 | Disposition: A | Discharge status: Home / Self Care | Payer: Medicare Other | Attending: Emergency Medicine | Admitting: Emergency Medicine

## 2016-12-02 DIAGNOSIS — D5700 Hb-SS disease with crisis, unspecified: Secondary | ICD-10-CM | POA: Diagnosis not present

## 2016-12-02 DIAGNOSIS — R509 Fever, unspecified: Secondary | ICD-10-CM | POA: Diagnosis present

## 2016-12-02 DIAGNOSIS — Z7982 Long term (current) use of aspirin: Secondary | ICD-10-CM | POA: Diagnosis not present

## 2016-12-02 MED ORDER — HYDROMORPHONE HCL 2 MG/ML IJ SOLN
2.0000 mg | Freq: Once | INTRAMUSCULAR | Status: AC
  Administered 2016-12-02: 2 mg via INTRAVENOUS
  Filled 2016-12-02: qty 1

## 2016-12-02 MED ORDER — PROMETHAZINE HCL 25 MG/ML IJ SOLN
12.5000 mg | Freq: Once | INTRAMUSCULAR | Status: AC
  Administered 2016-12-02: 12.5 mg via INTRAVENOUS
  Filled 2016-12-02: qty 1

## 2016-12-02 MED ORDER — KETOROLAC TROMETHAMINE 30 MG/ML IJ SOLN
30.0000 mg | Freq: Once | INTRAMUSCULAR | Status: AC
  Administered 2016-12-02: 30 mg via INTRAVENOUS
  Filled 2016-12-02: qty 1

## 2016-12-02 MED ORDER — DIPHENHYDRAMINE HCL 50 MG/ML IJ SOLN
12.5000 mg | Freq: Once | INTRAMUSCULAR | Status: AC
  Administered 2016-12-02: 12.5 mg via INTRAVENOUS
  Filled 2016-12-02: qty 1

## 2016-12-02 MED ORDER — SODIUM CHLORIDE 0.9 % IV BOLUS (SEPSIS)
1000.0000 mL | Freq: Once | INTRAVENOUS | Status: AC
  Administered 2016-12-02: 1000 mL via INTRAVENOUS

## 2016-12-02 MED ORDER — ONDANSETRON HCL 4 MG/2ML IJ SOLN
4.0000 mg | Freq: Once | INTRAMUSCULAR | Status: AC
  Administered 2016-12-02: 4 mg via INTRAVENOUS
  Filled 2016-12-02: qty 2

## 2016-12-02 NOTE — ED Triage Notes (Signed)
Pt c/o generalized pain .

## 2016-12-02 NOTE — Discharge Instructions (Signed)
Call Dr. Doreene Burke in the morning if your symptoms persist or worsen.

## 2016-12-02 NOTE — ED Provider Notes (Signed)
[facility] DEPT Provider Note   CSN: [id number] Arrival date & time: 12/02/16  5329     History   Chief Complaint Chief Complaint  Patient presents with  . Sickle Cell Pain Crisis    HPI Dawn [name] is a 36 y.o. female with a history of sickle cell disease present with an acute flare of her illness,  Describing deep aching pain in her arms, lower legs and back which is consistent with a sickle flare.  She denies chest pain or shortness of breath.  She was seen here yesterday for the same and felt somewhat improved after that visit but her pain worsened again last night.  She denies fever currently but had a low grade 100.4 fever 3 days ago.  She is taking ms contin q 12 hours and also taking her oxycodone for breakthrough pain every 4 hours without relief.  She denies peripheral edema or weakness or numbness. She also denies abdominal pain, nausea, vomiting, diarrhea.  The history is provided by the patient.    Past Medical History:  Diagnosis Date  . Blood transfusion   . Demand ischemia ([place]) 02/06/2016  . HCAP (healthcare-associated pneumonia) 05/19/2013  . 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 Pulmonary hypertension 02/06/2016   49 mmHg per echo on 02/06/16   . 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 Sickle cell anemia (HCC)   . Sickle cell disease ([place])   . Sickle cell disease, type S ([place])   . Vitamin B12 deficiency 07/07/2015    Patient Active Problem List   Diagnosis Date Noted  . Sickle cell anemia with pain ([place]) 11/06/2016  . Syncope 10/17/2016  . Dizziness   . Hypomagnesemia   . Swelling of right upper extremity   . 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 Sickle cell crisis ([place]) 10/03/2016  . Positive D dimer   . Pulmonary hypertension 02/16/2016  . 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 Anemia of chronic disease   . Chronic pain syndrome 10/14/2015  . 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 Vitamin B12 deficiency 07/07/2015  . Thrombocytopenia ([place])  07/06/2015  . Hyperbilirubinemia 09/11/2014  . Macrocytosis 06/13/2014  . Vitamin D deficiency 11/23/2013  . 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 Hypokalemia 03/28/2012  . 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 Elevated LFTs 04/27/2011  . Sickle cell disease ([place]) 04/26/2011    Past Surgical History:  Procedure Laterality Date  .  left knee ACL reconstruction    . CESAREAN SECTION     x 2  . CHOLECYSTECTOMY    . IR GENERIC HISTORICAL  07/17/2016   IR FLUORO GUIDE PORT INSERTION RIGHT 07/17/2016 Aletta Edouard, MD [facility]  . IR GENERIC HISTORICAL  07/17/2016   IR US GUIDE VASC ACCESS RIGHT 07/17/2016 Aletta Edouard, MD [facility]  . IR GENERIC HISTORICAL  10/12/2016   IR PTA VENOUS EXCEPT DIALYSIS CIRCUIT 10/12/2016 Arne Cleveland, MD [facility]  . IR GENERIC HISTORICAL  10/12/2016   IR US GUIDE VASC ACCESS RIGHT 10/12/2016 Arne Cleveland, MD [facility]  . IR GENERIC HISTORICAL  10/12/2016   IR VENO/EXT/UNI RIGHT 10/12/2016 Arne Cleveland, MD [facility]  . PORT-A-CATH REMOVAL Left 07/29/2013   Procedure: REMOVAL PORT-A-CATH;  Surgeon: Scherry Ran, MD;  Location: [facility];  Service: General;  Laterality: Left;  . PORT-A-CATH REMOVAL Right 11/14/2016   Procedure: REMOVAL PORT-A-CATH;  Surgeon: Aviva Signs, MD;  Location: [facility];  Service: General;  Laterality: Right;  . PORTACATH  PLACEMENT    . PORTACATH PLACEMENT Left 02/23/2013   Procedure: INSERTION PORT-A-CATH;  Surgeon: Donato Heinz, MD;  Location: [facility];  Service: General;  Laterality: Left;  . TEE WITHOUT CARDIOVERSION N/A 08/07/2013   Procedure: TRANSESOPHAGEAL ECHOCARDIOGRAM (TEE);  Surgeon: Arnoldo Lenis, MD;  [facility];  Service: Cardiology;  Laterality: N/A;    OB History    Gravida Para Term Preterm AB Living   1 1   1   1    SAB TAB Ectopic Multiple Live Births                   Home Medications     Prior to Admission medications   Medication Sig Start Date End Date Taking? Authorizing Provider  aspirin EC 81 MG EC tablet Take 1 tablet (81 mg total) by mouth daily. 10/18/16   Leana Gamer, MD  ceFAZolin (ANCEF) 2-4 GM/100ML-% IVPB Inject 100 mLs (2 g total) into the vein every 8 (eight) hours. 11/21/16 12/19/16  Eber Jones, MD  folic acid (FOLVITE) 1 MG tablet Take 1 tablet (1 mg total) by mouth daily. 03/14/16   [name], FNP  hydroxyurea (HYDREA) 500 MG capsule Take 2 capsules (1,000 mg total) by mouth daily. May take with food to minimize GI side effects. 03/14/16   [name], FNP  levonorgestrel (MIRENA) 20 MCG/24HR IUD 1 each by Intrauterine route once.    Historical Provider, MD  loratadine (CLARITIN) 10 MG tablet Take 10 mg by mouth daily.    Historical Provider, MD  morphine (MS CONTIN) 15 MG 12 hr tablet Take 1 tablet (15 mg total) by mouth every 12 (twelve) hours. 11/01/16   Tresa Garter, MD  Multiple Vitamin (MULTIVITAMIN WITH MINERALS) TABS tablet Take 1 tablet by mouth daily.    Historical Provider, MD  Oxycodone HCl 10 MG TABS Take 1 tablet (10 mg total) by mouth every 4 (four) hours as needed. 11/01/16   Tresa Garter, MD  promethazine (PHENERGAN) 12.5 MG tablet Take 1 tablet (12.5 mg total) by mouth every 6 (six) hours as needed for nausea or vomiting. 11/01/16   Tresa Garter, MD  Vitamin D, Ergocalciferol, (DRISDOL) 50000 units CAPS capsule Take 1 capsule (50,000 Units total) by mouth every 7 (seven) days. Takes on Sundays. 06/01/16   Micheline Chapman, NP    Family History Family History  Problem Relation Age of Onset  . Sickle cell trait Mother   . Sickle cell trait Father   . Hypertension Father   . Diabetes Father   . Sickle cell trait Sister   . Cancer Paternal Aunt     Breast    Social History Social History  Substance Use Topics  . Smoking status: Never Smoker  . Smokeless tobacco: Never Used  . Alcohol use No      Allergies   Desferal [deferoxamine]; Latex; Lisinopril; Tape; and Tylenol [acetaminophen]   Review of Systems Review of Systems  Constitutional: Negative for fever.  HENT: Negative for congestion and sore throat.   Eyes: Negative.   Respiratory: Negative for chest tightness and shortness of breath.   Cardiovascular: Negative for chest pain.  Gastrointestinal: Negative for abdominal pain and nausea.  Genitourinary: Negative.   Musculoskeletal: Positive for arthralgias and back pain. Negative for joint swelling and neck pain.  Skin: Negative.  Negative for rash and wound.  Neurological: Negative for dizziness, weakness, light-headedness, numbness and headaches.  Psychiatric/Behavioral: Negative.  Physical Exam Updated Vital Signs BP 131/94 (BP Location: Left Arm)   Pulse 84   Temp 98.9 F (37.2 C) (Oral)   Resp 18   Ht 5\' 3"  (1.6 m)   Wt 73.9 kg   SpO2 99%   BMI 28.87 kg/m   Physical Exam  Constitutional: She appears well-developed and well-nourished.  HENT:  Head: Normocephalic and atraumatic.  Eyes: Conjunctivae are normal. Scleral icterus is present.  Neck: Normal range of motion.  Cardiovascular: Normal rate, regular rhythm, normal heart sounds and intact distal pulses.   Pulses:      Radial pulses are 2+ on the right side, and 2+ on the left side.       Dorsalis pedis pulses are 2+ on the right side, and 2+ on the left side.  Pulmonary/Chest: Effort normal and breath sounds normal. She has no wheezes.  Abdominal: Soft. Bowel sounds are normal. There is no tenderness.  Musculoskeletal: Normal range of motion. She exhibits tenderness. She exhibits no edema.  Tender bilateral lower legs and upper arms.    Neurological: She is alert.  Skin: Skin is warm and dry.  Psychiatric: She has a normal mood and affect.  Nursing note and vitals reviewed.    ED Treatments / Results  Labs (all labs ordered are listed, but only abnormal results are  displayed) Labs Reviewed - No data to display  EKG  EKG Interpretation None       Radiology No results found.  Procedures Procedures (including critical care time)  Medications Ordered in ED Medications  HYDROmorphone (DILAUDID) injection 2 mg (2 mg Intravenous Given 12/02/16 1117)  ketorolac (TORADOL) 30 MG/ML injection 30 mg (30 mg Intravenous Given 12/02/16 1116)  diphenhydrAMINE (BENADRYL) injection 12.5 mg (12.5 mg Intravenous Given 12/02/16 1114)  ondansetron (ZOFRAN) injection 4 mg (4 mg Intravenous Given 12/02/16 1115)  sodium chloride 0.9 % bolus 1,000 mL (0 mLs Intravenous Stopped 12/02/16 1229)  sodium chloride 0.9 % bolus 1,000 mL (0 mLs Intravenous Stopped 12/02/16 1504)  HYDROmorphone (DILAUDID) injection 2 mg (2 mg Intravenous Given 12/02/16 1242)  HYDROmorphone (DILAUDID) injection 2 mg (2 mg Intravenous Given 12/02/16 1519)  promethazine (PHENERGAN) injection 12.5 mg (12.5 mg Intravenous Given 12/02/16 1552)  HYDROmorphone (DILAUDID) injection 2 mg (2 mg Intravenous Given 12/02/16 1552)  sodium chloride 0.9 % bolus 1,000 mL (0 mLs Intravenous Stopped 12/02/16 1656)     Initial Impression / Assessment and Plan / ED Course  I have reviewed the triage vital signs and the nursing notes.  Pertinent labs & imaging results that were available during my care of the patient were reviewed by me and considered in my medical decision making (see chart for details).     Pt given dilaudid 2 mg IV, toradol 30 mg, benadryl (itching) and zofran.  IV fluids, 1 L.  Pain now 8/10 from 9/10.  Repeat fluid bolus , dilaudid 2 mg ordered.  Pt rceived a total of 3 L NS, dilaudid 2 mg x4 over the course of 6 hours here. Her sx were improved at time of dc.   Advised f/u with pcp in am if sx are persistent or escalate.  Final Clinical Impressions(s) / ED Diagnoses   Final diagnoses:  Sickle cell pain crisis [facility])    New Prescriptions Discharge Medication List as of 12/02/2016  4:31 PM        Evalee Jefferson, PA-C 12/02/16 [place], MD 12/10/16 1227

## 2016-12-06 ENCOUNTER — Emergency Department (HOSPITAL_COMMUNITY)
Admission: EM | Admit: 2016-12-06 | Discharge: 2016-12-06 | Disposition: A | Discharge status: Home / Self Care | Payer: Medicare Other | Attending: Emergency Medicine | Admitting: Emergency Medicine

## 2016-12-06 ENCOUNTER — Encounter (HOSPITAL_COMMUNITY): Payer: Self-pay | Admitting: *Deleted

## 2016-12-06 DIAGNOSIS — D5700 Hb-SS disease with crisis, unspecified: Secondary | ICD-10-CM | POA: Diagnosis not present

## 2016-12-06 LAB — CBC WITH DIFFERENTIAL/PLATELET
Basophils Absolute: 0 10*3/uL (ref 0.0–0.1)
Basophils Relative: 1 %
EOS ABS: 0.1 10*3/uL (ref 0.0–0.7)
Eosinophils Relative: 2 %
HCT: 24.9 % — ABNORMAL LOW (ref 36.0–46.0)
HEMOGLOBIN: 9.1 g/dL — AB (ref 12.0–15.0)
LYMPHS ABS: 1.6 10*3/uL (ref 0.7–4.0)
Lymphocytes Relative: 25 %
MCH: 36.5 pg — ABNORMAL HIGH (ref 26.0–34.0)
MCHC: 36.5 g/dL — ABNORMAL HIGH (ref 30.0–36.0)
MCV: 100 fL (ref 78.0–100.0)
Monocytes Absolute: 1.3 10*3/uL — ABNORMAL HIGH (ref 0.1–1.0)
Monocytes Relative: 20 %
NEUTROS ABS: 3.3 10*3/uL (ref 1.7–7.7)
Neutrophils Relative %: 52 %
PLATELETS: 175 10*3/uL (ref 150–400)
RBC: 2.49 MIL/uL — AB (ref 3.87–5.11)
RDW: 25.1 % — ABNORMAL HIGH (ref 11.5–15.5)
WBC: 6.3 10*3/uL (ref 4.0–10.5)

## 2016-12-06 LAB — URINALYSIS, ROUTINE W REFLEX MICROSCOPIC
Bilirubin Urine: NEGATIVE
GLUCOSE, UA: NEGATIVE mg/dL
KETONES UR: NEGATIVE mg/dL
LEUKOCYTES UA: NEGATIVE
Nitrite: NEGATIVE
PROTEIN: 100 mg/dL — AB
Specific Gravity, Urine: 1.004 — ABNORMAL LOW (ref 1.005–1.030)
pH: 7 (ref 5.0–8.0)

## 2016-12-06 LAB — RETICULOCYTES
RBC.: 2.49 MIL/uL — ABNORMAL LOW (ref 3.87–5.11)
RETIC CT PCT: 19.5 % — AB (ref 0.4–3.1)
Retic Count, Absolute: 485.6 10*3/uL — ABNORMAL HIGH (ref 19.0–186.0)

## 2016-12-06 LAB — BASIC METABOLIC PANEL
Anion gap: 6 (ref 5–15)
BUN: <5 mg/dL — ABNORMAL LOW (ref 6–20)
CHLORIDE: 103 mmol/L (ref 101–111)
CO2: 25 mmol/L (ref 22–32)
CREATININE: 0.39 mg/dL — AB (ref 0.44–1.00)
Calcium: 8.5 mg/dL — ABNORMAL LOW (ref 8.9–10.3)
GFR calc Af Amer: >60 mL/min (ref >60)
GFR calc non Af Amer: >60 mL/min (ref >60)
Glucose, Bld: 87 mg/dL (ref 65–99)
Potassium: 3.9 mmol/L (ref 3.5–5.1)
SODIUM: 134 mmol/L — AB (ref 135–145)

## 2016-12-06 LAB — PREGNANCY, URINE: Preg Test, Ur: NEGATIVE

## 2016-12-06 MED ORDER — HYDROMORPHONE HCL 2 MG/ML IJ SOLN: 2.0000 mg | INTRAMUSCULAR | Status: AC

## 2016-12-06 MED ORDER — HYDROMORPHONE HCL 2 MG/ML IJ SOLN
2.0000 mg | INTRAMUSCULAR | Status: AC
  Filled 2016-12-06: qty 1

## 2016-12-06 MED ORDER — HYDROMORPHONE HCL 2 MG/ML IJ SOLN: 2.0000 mg | INTRAMUSCULAR | Status: DC

## 2016-12-06 MED ORDER — HYDROMORPHONE HCL 2 MG/ML IJ SOLN
2.0000 mg | INTRAMUSCULAR | Status: AC
  Administered 2016-12-06: 2 mg via INTRAVENOUS
  Filled 2016-12-06: qty 1

## 2016-12-06 MED ORDER — PROMETHAZINE HCL 12.5 MG PO TABS
25.0000 mg | ORAL_TABLET | ORAL | Status: DC | PRN
  Administered 2016-12-06: 25 mg via ORAL
  Filled 2016-12-06: qty 2

## 2016-12-06 MED ORDER — SODIUM CHLORIDE 0.45 % IV SOLN
INTRAVENOUS | Status: DC
  Administered 2016-12-06: 10:00:00 via INTRAVENOUS

## 2016-12-06 MED ORDER — DIPHENHYDRAMINE HCL 25 MG PO CAPS
25.0000 mg | ORAL_CAPSULE | ORAL | Status: DC | PRN
  Administered 2016-12-06: 25 mg via ORAL
  Filled 2016-12-06: qty 1

## 2016-12-06 MED ORDER — HYDROMORPHONE HCL 2 MG/ML IJ SOLN
2.0000 mg | INTRAMUSCULAR | Status: DC
  Administered 2016-12-06 (×3): 2 mg via INTRAVENOUS
  Filled 2016-12-06 (×2): qty 1

## 2016-12-06 NOTE — ED Provider Notes (Signed)
[facility] DEPT Provider Note   CSN: [id number] Arrival date & time: 12/06/16  0907     History   Chief Complaint Chief Complaint  Patient presents with  . Sickle Cell Pain Crisis    HPI Dawn [name] is a 36 y.o. female.  HPI  Pt was seen at 0945. Per pt, c/o gradual onset and persistence of constant acute flair of her chronic sickle cell "pains" for the past several days. Describes the pain as per her usual pain pattern, located in her lower back, arms and legs. Pt has been taking her home meds without improvement. The symptoms have been associated with no other complaints. Denies fever, no rash, no CP/SOB, no abd pain, no N/V/D, no focal motor weakness. The patient has a significant history of similar symptoms previously, recently being evaluated for this complaint and multiple prior evals for same.  Pt has had 6 ED visits this month for these symptoms.     Past Medical History:  Diagnosis Date  . Blood transfusion   . Demand ischemia ([place]) 02/06/2016  . HCAP (healthcare-associated pneumonia) 05/19/2013  . 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 Pulmonary hypertension 02/06/2016   49 mmHg per echo on 02/06/16   . 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 Sickle cell anemia (HCC)   . Sickle cell disease ([place])   . 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 Vitamin B12 deficiency 07/07/2015    Patient Active Problem List   Diagnosis Date Noted  . Sickle cell anemia with pain ([place]) 11/06/2016  . Syncope 10/17/2016  . Dizziness   . Hypomagnesemia   . Swelling of right upper extremity   . 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 Sickle cell crisis ([place]) 10/03/2016  . Positive D dimer   . Pulmonary hypertension 02/16/2016  . 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 Anemia of chronic disease   . Chronic pain syndrome 10/14/2015  . 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 Vitamin B12 deficiency 07/07/2015  . Thrombocytopenia ([place]) 07/06/2015  . Hyperbilirubinemia 09/11/2014  .  Macrocytosis 06/13/2014  . Vitamin D deficiency 11/23/2013  . 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 Hypokalemia 03/28/2012  . 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 Elevated LFTs 04/27/2011  . Sickle cell disease ([place]) 04/26/2011    Past Surgical History:  Procedure Laterality Date  .  left knee ACL reconstruction    . CESAREAN SECTION     x 2  . CHOLECYSTECTOMY    . IR GENERIC HISTORICAL  07/17/2016   IR FLUORO GUIDE PORT INSERTION RIGHT 07/17/2016 Aletta Edouard, MD [facility]  . IR GENERIC HISTORICAL  07/17/2016   IR US GUIDE VASC ACCESS RIGHT 07/17/2016 Aletta Edouard, MD [facility]  . IR GENERIC HISTORICAL  10/12/2016   IR PTA VENOUS EXCEPT DIALYSIS CIRCUIT 10/12/2016 Arne Cleveland, MD [facility]  . IR GENERIC HISTORICAL  10/12/2016   IR US GUIDE VASC ACCESS RIGHT 10/12/2016 Arne Cleveland, MD [facility]  . IR GENERIC HISTORICAL  10/12/2016   IR VENO/EXT/UNI RIGHT 10/12/2016 Arne Cleveland, MD [facility]  . PORT-A-CATH REMOVAL Left 07/29/2013   Procedure: REMOVAL PORT-A-CATH;  Surgeon: Scherry Ran, MD;  Location: [facility];  Service: General;  Laterality: Left;  . PORT-A-CATH REMOVAL Right 11/14/2016   Procedure: REMOVAL PORT-A-CATH;  Surgeon: Aviva Signs, MD;  Location: [facility];  Service: General;  Laterality: Right;  . PORTACATH PLACEMENT    . PORTACATH PLACEMENT Left 02/23/2013   Procedure: INSERTION  PORT-A-CATH;  Surgeon: Donato Heinz, MD;  Location: [facility];  Service: General;  Laterality: Left;  . TEE WITHOUT CARDIOVERSION N/A 08/07/2013   Procedure: TRANSESOPHAGEAL ECHOCARDIOGRAM (TEE);  Surgeon: Arnoldo Lenis, MD;  Location: [facility];  Service: Cardiology;  Laterality: N/A;    OB History    Gravida Para Term Preterm AB Living   1 1   1   1    SAB TAB Ectopic Multiple Live Births                   Home Medications    Prior to Admission medications   Medication Sig  Start Date End Date Taking? Authorizing Provider  aspirin EC 81 MG EC tablet Take 1 tablet (81 mg total) by mouth daily. 10/18/16   Leana Gamer, MD  ceFAZolin (ANCEF) 2-4 GM/100ML-% IVPB Inject 100 mLs (2 g total) into the vein every 8 (eight) hours. 11/21/16 12/19/16  Eber Jones, MD  folic acid (FOLVITE) 1 MG tablet Take 1 tablet (1 mg total) by mouth daily. 03/14/16   [name], FNP  hydroxyurea (HYDREA) 500 MG capsule Take 2 capsules (1,000 mg total) by mouth daily. May take with food to minimize GI side effects. 03/14/16   [name], FNP  levonorgestrel (MIRENA) 20 MCG/24HR IUD 1 each by Intrauterine route once.    Historical Provider, MD  loratadine (CLARITIN) 10 MG tablet Take 10 mg by mouth daily.    Historical Provider, MD  morphine (MS CONTIN) 15 MG 12 hr tablet Take 1 tablet (15 mg total) by mouth every 12 (twelve) hours. 11/01/16   Tresa Garter, MD  Multiple Vitamin (MULTIVITAMIN WITH MINERALS) TABS tablet Take 1 tablet by mouth daily.    Historical Provider, MD  Oxycodone HCl 10 MG TABS Take 1 tablet (10 mg total) by mouth every 4 (four) hours as needed. 11/01/16   Tresa Garter, MD  promethazine (PHENERGAN) 12.5 MG tablet Take 1 tablet (12.5 mg total) by mouth every 6 (six) hours as needed for nausea or vomiting. 11/01/16   Tresa Garter, MD  Vitamin D, Ergocalciferol, (DRISDOL) 50000 units CAPS capsule Take 1 capsule (50,000 Units total) by mouth every 7 (seven) days. Takes on Sundays. 06/01/16   Micheline Chapman, NP    Family History Family History  Problem Relation Age of Onset  . Sickle cell trait Mother   . Sickle cell trait Father   . Hypertension Father   . Diabetes Father   . Sickle cell trait Sister   . Cancer Paternal Aunt     Breast    Social History Social History  Substance Use Topics  . Smoking status: Never Smoker  . Smokeless tobacco: Never Used  . Alcohol use No     Allergies   Desferal [deferoxamine]; Latex;  Lisinopril; Tape; and Tylenol [acetaminophen]   Review of Systems Review of Systems ROS: Statement: All systems negative except as marked or noted in the HPI; Constitutional: Negative for fever and chills. ; ; Eyes: Negative for eye pain, redness and discharge. ; ; ENMT: Negative for ear pain, hoarseness, nasal congestion, sinus pressure and sore throat. ; ; Cardiovascular: Negative for chest pain, palpitations, diaphoresis, dyspnea and peripheral edema. ; ; Respiratory: Negative for cough, wheezing and stridor. ; ; Gastrointestinal: Negative for nausea, vomiting, diarrhea, abdominal pain, blood in stool, hematemesis, jaundice and rectal bleeding. . ; ; Genitourinary: Negative for dysuria, flank pain and hematuria. ; ; Musculoskeletal: +arms, legs,  back pain. Negative for neck pain. Negative for swelling and trauma.; ; Skin: Negative for pruritus, rash, abrasions, blisters, bruising and skin lesion.; ; Neuro: Negative for headache, lightheadedness and neck stiffness. Negative for weakness, altered level of consciousness, altered mental status, extremity weakness, paresthesias, involuntary movement, seizure and syncope.       Physical Exam Updated Vital Signs BP (!) 153/103   Pulse 105   Temp 98.9 F (37.2 C) (Oral)   Resp 16   Ht 5\' 3"  (1.6 m)   Wt 163 lb (73.9 kg)   SpO2 96%   BMI 28.87 kg/m    Patient Vitals for the past 24 hrs:  BP Temp Temp src Pulse Resp SpO2 Height Weight  12/06/16 1500 136/90 - - 94 20 91 % - -  12/06/16 1200 139/79 - - 94 22 95 % - -  12/06/16 1000 135/95 - - 90 11 95 % - -  12/06/16 0954 - - - - - - - 163 lb (73.9 kg)  12/06/16 0928 (!) 153/103 98.9 F (37.2 C) Oral 105 16 96 % 5\' 3"  (1.6 m) 163 lb (73.9 kg)     Physical Exam 0950: Physical examination:  Nursing notes reviewed; Vital signs and O2 SAT reviewed;  Constitutional: Well developed, Well nourished, Well hydrated, In no acute distress; Head:  Normocephalic, atraumatic; Eyes: EOMI, PERRL, No  scleral icterus; ENMT: Mouth and pharynx normal, Mucous membranes moist; Neck: Supple, Full range of motion, No lymphadenopathy; Cardiovascular: Regular rate and rhythm, No gallop; Respiratory: Breath sounds clear & equal bilaterally, No wheezes.  Speaking full sentences with ease, Normal respiratory effort/excursion; Chest: Nontender, Movement normal; Abdomen: Soft, Nontender, Nondistended, Normal bowel sounds; Genitourinary: No CVA tenderness; Extremities: Pulses normal, No tenderness, No edema, No calf edema or asymmetry.; Neuro: AA&Ox3, Major CN grossly intact.  Speech clear. No gross focal motor or sensory deficits in extremities.; Skin: Color normal, Warm, Dry.   ED Treatments / Results  Labs (all labs ordered are listed, but only abnormal results are displayed)   EKG  EKG Interpretation None       Radiology   Procedures Procedures (including critical care time)  Medications Ordered in ED Medications  0.45 % sodium chloride infusion ( Intravenous New Bag/Given 12/06/16 1006)  HYDROmorphone (DILAUDID) injection 2 mg (not administered)    Or  HYDROmorphone (DILAUDID) injection 2 mg (not administered)  HYDROmorphone (DILAUDID) injection 2 mg (2 mg Intravenous Given 12/06/16 1505)    Or  HYDROmorphone (DILAUDID) injection 2 mg ( Subcutaneous See Alternative 12/06/16 1505)  promethazine (PHENERGAN) tablet 25 mg (25 mg Oral Given 12/06/16 1009)  diphenhydrAMINE (BENADRYL) capsule 25-50 mg (25 mg Oral Given 12/06/16 1009)  HYDROmorphone (DILAUDID) injection 2 mg (2 mg Intravenous Given 12/06/16 1021)    Or  HYDROmorphone (DILAUDID) injection 2 mg ( Subcutaneous See Alternative 12/06/16 1021)  HYDROmorphone (DILAUDID) injection 2 mg (2 mg Intravenous Given 12/06/16 1122)    Or  HYDROmorphone (DILAUDID) injection 2 mg ( Subcutaneous See Alternative 12/06/16 1122)     Initial Impression / Assessment and Plan / ED Course  I have reviewed the triage vital signs and the nursing  notes.  Pertinent labs & imaging results that were available during my care of the patient were reviewed by me and considered in my medical decision making (see chart for details).  MDM Reviewed: nursing note, previous chart and vitals Reviewed previous: labs Interpretation: labs    Results for orders placed or performed during the hospital encounter of 12/06/16  Basic metabolic panel  Result Value Ref Range   Sodium 134 (L) 135 - 145 mmol/L   Potassium 3.9 3.5 - 5.1 mmol/L   Chloride 103 101 - 111 mmol/L   CO2 25 22 - 32 mmol/L   Glucose, Bld 87 65 - 99 mg/dL   BUN <5 (L) 6 - 20 mg/dL   Creatinine, Ser 0.39 (L) 0.44 - 1.00 mg/dL   Calcium 8.5 (L) 8.9 - 10.3 mg/dL   GFR calc non Af Amer >60 >60 mL/min   GFR calc Af Amer >60 >60 mL/min   Anion gap 6 5 - 15  Reticulocytes  Result Value Ref Range   Retic Ct Pct 19.5 (H) 0.4 - 3.1 %   RBC. 2.49 (L) 3.87 - 5.11 MIL/uL   Retic Count, Manual 485.6 (H) 19.0 - 186.0 K/uL  CBC WITH DIFFERENTIAL  Result Value Ref Range   WBC 6.3 4.0 - 10.5 K/uL   RBC 2.49 (L) 3.87 - 5.11 MIL/uL   Hemoglobin 9.1 (L) 12.0 - 15.0 g/dL   HCT 24.9 (L) 36.0 - 46.0 %   MCV 100.0 78.0 - 100.0 fL   MCH 36.5 (H) 26.0 - 34.0 pg   MCHC 36.5 (H) 30.0 - 36.0 g/dL   RDW 25.1 (H) 11.5 - 15.5 %   Platelets 175 150 - 400 K/uL   Neutrophils Relative % 52 %   Neutro Abs 3.3 1.7 - 7.7 K/uL   Lymphocytes Relative 25 %   Lymphs Abs 1.6 0.7 - 4.0 K/uL   Monocytes Relative 20 %   Monocytes Absolute 1.3 (H) 0.1 - 1.0 K/uL   Eosinophils Relative 2 %   Eosinophils Absolute 0.1 0.0 - 0.7 K/uL   Basophils Relative 1 %   Basophils Absolute 0.0 0.0 - 0.1 K/uL   RBC Morphology RARE NRBCs   Urinalysis, Routine w reflex microscopic  Result Value Ref Range   Color, Urine AMBER (A) YELLOW   APPearance HAZY (A) CLEAR   Specific Gravity, Urine 1.004 (L) 1.005 - 1.030   pH 7.0 5.0 - 8.0   Glucose, UA NEGATIVE NEGATIVE mg/dL   Hgb urine dipstick MODERATE (A) NEGATIVE    Bilirubin Urine NEGATIVE NEGATIVE   Ketones, ur NEGATIVE NEGATIVE mg/dL   Protein, ur 100 (A) NEGATIVE mg/dL   Nitrite NEGATIVE NEGATIVE   Leukocytes, UA NEGATIVE NEGATIVE   RBC / HPF 0-5 0 - 5 RBC/hpf   WBC, UA 0-5 0 - 5 WBC/hpf   Bacteria, UA RARE (A) NONE SEEN   Budding Yeast PRESENT   Urine Pregnancy  Result Value Ref Range   Preg Test, Ur NEGATIVE NEGATIVE   1550:  Pt does not want to be admitted. Labs per baseline. Dx and testing d/w pt.  Questions answered.  Verb understanding, agreeable to d/c home with outpt f/u.      Final Clinical Impressions(s) / ED Diagnoses   Final diagnoses:  None    New Prescriptions New Prescriptions   No medications on file     Francine Graven, DO 12/09/16 1523

## 2016-12-06 NOTE — ED Triage Notes (Signed)
Pt c/o ongoing sickle cell pain crisis. Pt had central line infection recently and has been having crisis ever since. Pt c/o pain to lower back, bilateral arms and legs. Pt has been taking home regimen of pain medication without relief of pain.

## 2016-12-06 NOTE — Discharge Instructions (Signed)
Take your usual prescriptions as previously directed.  Call your regular Sickle Cell doctor today to schedule a follow up appointment within the next 1 to 2 days.  Return to the Emergency Department immediately sooner if worsening.

## 2016-12-07 DIAGNOSIS — D638 Anemia in other chronic diseases classified elsewhere: Secondary | ICD-10-CM | POA: Diagnosis not present

## 2016-12-07 DIAGNOSIS — I272 Pulmonary hypertension, unspecified: Secondary | ICD-10-CM | POA: Diagnosis not present

## 2016-12-07 DIAGNOSIS — B9561 Methicillin susceptible Staphylococcus aureus infection as the cause of diseases classified elsewhere: Secondary | ICD-10-CM | POA: Diagnosis not present

## 2016-12-07 DIAGNOSIS — Z452 Encounter for adjustment and management of vascular access device: Secondary | ICD-10-CM | POA: Diagnosis not present

## 2016-12-07 DIAGNOSIS — T80211A Bloodstream infection due to central venous catheter, initial encounter: Secondary | ICD-10-CM | POA: Diagnosis not present

## 2016-12-07 DIAGNOSIS — D5700 Hb-SS disease with crisis, unspecified: Secondary | ICD-10-CM | POA: Diagnosis not present

## 2016-12-09 ENCOUNTER — Emergency Department (HOSPITAL_COMMUNITY)
Admission: EM | Admit: 2016-12-09 | Discharge: 2016-12-09 | Disposition: A | Discharge status: Home Health | Payer: Medicare Other | Attending: Emergency Medicine | Admitting: Emergency Medicine

## 2016-12-09 ENCOUNTER — Encounter (HOSPITAL_COMMUNITY): Payer: Self-pay | Admitting: Emergency Medicine

## 2016-12-09 ENCOUNTER — Emergency Department (HOSPITAL_COMMUNITY): Payer: Medicare Other

## 2016-12-09 DIAGNOSIS — D5700 Hb-SS disease with crisis, unspecified: Secondary | ICD-10-CM | POA: Insufficient documentation

## 2016-12-09 DIAGNOSIS — Z7982 Long term (current) use of aspirin: Secondary | ICD-10-CM | POA: Diagnosis not present

## 2016-12-09 DIAGNOSIS — Z91040 Latex allergy status: Secondary | ICD-10-CM | POA: Diagnosis not present

## 2016-12-09 DIAGNOSIS — D57219 Sickle-cell/Hb-C disease with crisis, unspecified: Secondary | ICD-10-CM | POA: Diagnosis not present

## 2016-12-09 DIAGNOSIS — Z79899 Other long term (current) drug therapy: Secondary | ICD-10-CM | POA: Diagnosis not present

## 2016-12-09 DIAGNOSIS — R69 Illness, unspecified: Secondary | ICD-10-CM | POA: Diagnosis present

## 2016-12-09 DIAGNOSIS — R079 Chest pain, unspecified: Secondary | ICD-10-CM | POA: Diagnosis not present

## 2016-12-09 LAB — CBC WITH DIFFERENTIAL/PLATELET
BASOS ABS: 0 10*3/uL (ref 0.0–0.1)
BASOS PCT: 0 %
EOS ABS: 0 10*3/uL (ref 0.0–0.7)
Eosinophils Relative: 1 %
HEMATOCRIT: 22.8 % — AB (ref 36.0–46.0)
HEMOGLOBIN: 8.2 g/dL — AB (ref 12.0–15.0)
Lymphocytes Relative: 20 %
Lymphs Abs: 1.6 10*3/uL (ref 0.7–4.0)
MCH: 35.2 pg — ABNORMAL HIGH (ref 26.0–34.0)
MCHC: 36 g/dL (ref 30.0–36.0)
MCV: 97.9 fL (ref 78.0–100.0)
MONO ABS: 1.1 10*3/uL — AB (ref 0.1–1.0)
MONOS PCT: 14 %
NEUTROS PCT: 65 %
Neutro Abs: 5 10*3/uL (ref 1.7–7.7)
Platelets: 122 10*3/uL — ABNORMAL LOW (ref 150–400)
RBC: 2.33 MIL/uL — AB (ref 3.87–5.11)
RDW: 22.9 % — AB (ref 11.5–15.5)
WBC: 7.7 10*3/uL (ref 4.0–10.5)

## 2016-12-09 LAB — COMPREHENSIVE METABOLIC PANEL
ALBUMIN: 2.6 g/dL — AB (ref 3.5–5.0)
ALK PHOS: 127 U/L — AB (ref 38–126)
ALT: 30 U/L (ref 14–54)
AST: 89 U/L — AB (ref 15–41)
Anion gap: 8 (ref 5–15)
BILIRUBIN TOTAL: 12.4 mg/dL — AB (ref 0.3–1.2)
BUN: 5 mg/dL — AB (ref 6–20)
CO2: 22 mmol/L (ref 22–32)
CREATININE: 0.49 mg/dL (ref 0.44–1.00)
Calcium: 8.5 mg/dL — ABNORMAL LOW (ref 8.9–10.3)
Chloride: 110 mmol/L (ref 101–111)
GFR calc Af Amer: >60 mL/min (ref >60)
GFR calc non Af Amer: >60 mL/min (ref >60)
GLUCOSE: 100 mg/dL — AB (ref 65–99)
POTASSIUM: 3.2 mmol/L — AB (ref 3.5–5.1)
Sodium: 140 mmol/L (ref 135–145)
TOTAL PROTEIN: 7.4 g/dL (ref 6.5–8.1)

## 2016-12-09 LAB — URINALYSIS, MICROSCOPIC (REFLEX): Bacteria, UA: NONE SEEN

## 2016-12-09 LAB — URINALYSIS, ROUTINE W REFLEX MICROSCOPIC
Glucose, UA: NEGATIVE mg/dL
Ketones, ur: NEGATIVE mg/dL
Leukocytes, UA: NEGATIVE
NITRITE: NEGATIVE
PH: 7 (ref 5.0–8.0)
Protein, ur: 100 mg/dL — AB
SPECIFIC GRAVITY, URINE: 1.009 (ref 1.005–1.030)

## 2016-12-09 LAB — RETICULOCYTES
RBC.: 2.33 MIL/uL — ABNORMAL LOW (ref 3.87–5.11)
Retic Count, Absolute: 361.2 10*3/uL — ABNORMAL HIGH (ref 19.0–186.0)
Retic Ct Pct: 15.5 % — ABNORMAL HIGH (ref 0.4–3.1)

## 2016-12-09 MED ORDER — HYDROMORPHONE HCL 1 MG/ML IJ SOLN
1.0000 mg | Freq: Once | INTRAMUSCULAR | Status: AC
  Administered 2016-12-09: 1 mg via INTRAVENOUS
  Filled 2016-12-09: qty 1

## 2016-12-09 MED ORDER — ONDANSETRON HCL 4 MG/2ML IJ SOLN
4.0000 mg | Freq: Once | INTRAMUSCULAR | Status: AC
  Administered 2016-12-09: 4 mg via INTRAVENOUS
  Filled 2016-12-09: qty 2

## 2016-12-09 MED ORDER — HYDROMORPHONE HCL 2 MG/ML IJ SOLN
2.0000 mg | Freq: Once | INTRAMUSCULAR | Status: AC
  Administered 2016-12-09: 2 mg via INTRAVENOUS
  Filled 2016-12-09: qty 1

## 2016-12-09 MED ORDER — SODIUM CHLORIDE 0.9 % IV BOLUS (SEPSIS)
1000.0000 mL | Freq: Once | INTRAVENOUS | Status: AC
  Administered 2016-12-09: 1000 mL via INTRAVENOUS

## 2016-12-09 MED ORDER — DIPHENHYDRAMINE HCL 50 MG/ML IJ SOLN
25.0000 mg | Freq: Once | INTRAMUSCULAR | Status: AC
  Administered 2016-12-09: 25 mg via INTRAVENOUS
  Filled 2016-12-09: qty 1

## 2016-12-09 NOTE — ED Triage Notes (Signed)
Patient c/o sickle cell pain. Per patient lower back, legs, and bilateral arm pain. Denies any fevers. Per patient pain started yesterday.

## 2016-12-09 NOTE — Discharge Instructions (Signed)
Follow-up with your doctor if need Continue with her present pain medicines

## 2016-12-09 NOTE — ED Provider Notes (Signed)
[facility] DEPT Provider Note   CSN: [id number] Arrival date & time: 12/09/16  0801  By signing my name below, I, Jeanell Sparrow, attest that this documentation has been prepared under the direction and in the presence of [name], MD. Electronically Signed: Jeanell Sparrow, Scribe. 12/09/2016. 8:05 AM.  History   Chief Complaint No chief complaint on file.  Patient complains of lower back pain. This is similar to her sickle painful crisis.   The history is provided by the patient. No language interpreter was used.  Illness  This is a new problem. The current episode started 12 to 24 hours ago. The problem occurs constantly. The problem has not changed since onset.Pertinent negatives include no chest pain, no abdominal pain and no headaches. Nothing aggravates the symptoms. Nothing relieves the symptoms. Treatments tried: Percocet.   HPI Comments:     .     PCP: [name], FNP  Past Medical History:  Diagnosis Date  . Blood transfusion   . Demand ischemia ([place]) 02/06/2016  . HCAP (healthcare-associated pneumonia) 05/19/2013  . 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 Pulmonary hypertension 02/06/2016   49 mmHg per echo on 02/06/16   . 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 Sickle cell anemia (HCC)   . Sickle cell disease ([place])   . 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 Vitamin B12 deficiency 07/07/2015    Patient Active Problem List   Diagnosis Date Noted  . Sickle cell anemia with pain ([place]) 11/06/2016  . Syncope 10/17/2016  . Dizziness   . Hypomagnesemia   . Swelling of right upper extremity   . 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 Sickle cell crisis ([place]) 10/03/2016  . Positive D dimer   . Pulmonary hypertension 02/16/2016  . 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 Anemia of chronic disease   . Chronic pain syndrome 10/14/2015  . 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 Vitamin B12 deficiency 07/07/2015  . Thrombocytopenia ([place]) 07/06/2015  .  Hyperbilirubinemia 09/11/2014  . Macrocytosis 06/13/2014  . Vitamin D deficiency 11/23/2013  . 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 Hypokalemia 03/28/2012  . 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 Elevated LFTs 04/27/2011  . Sickle cell disease ([place]) 04/26/2011    Past Surgical History:  Procedure Laterality Date  .  left knee ACL reconstruction    . CESAREAN SECTION     x 2  . CHOLECYSTECTOMY    . IR GENERIC HISTORICAL  07/17/2016   IR FLUORO GUIDE PORT INSERTION RIGHT 07/17/2016 Aletta Edouard, MD [facility]  . IR GENERIC HISTORICAL  07/17/2016   IR US GUIDE VASC ACCESS RIGHT 07/17/2016 Aletta Edouard, MD [facility]  . IR GENERIC HISTORICAL  10/12/2016   IR PTA VENOUS EXCEPT DIALYSIS CIRCUIT 10/12/2016 Arne Cleveland, MD [facility]  . IR GENERIC HISTORICAL  10/12/2016   IR US GUIDE VASC ACCESS RIGHT 10/12/2016 Arne Cleveland, MD [facility]  . IR GENERIC HISTORICAL  10/12/2016   IR VENO/EXT/UNI RIGHT 10/12/2016 Arne Cleveland, MD [facility]  . PORT-A-CATH REMOVAL Left 07/29/2013   Procedure: REMOVAL PORT-A-CATH;  Surgeon: Scherry Ran, MD;  Location: [facility];  Service: General;  Laterality: Left;  . PORT-A-CATH REMOVAL Right 11/14/2016   Procedure: REMOVAL PORT-A-CATH;  Surgeon: Aviva Signs, MD;  Location: [facility];  Service: General;  Laterality: Right;  . PORTACATH PLACEMENT    . PORTACATH PLACEMENT Left 02/23/2013   Procedure: INSERTION PORT-A-CATH;  Surgeon: Donato Heinz, MD;  Location: [facility];  Service: General;  Laterality: Left;  . TEE WITHOUT CARDIOVERSION N/A 08/07/2013   Procedure: TRANSESOPHAGEAL ECHOCARDIOGRAM (TEE);  Surgeon: Arnoldo Lenis, MD;  [facility];  Service: Cardiology;  Laterality: N/A;    OB History    Gravida Para Term Preterm AB Living   1 1   1   1    SAB TAB Ectopic Multiple Live Births                   Home Medications    Prior to  Admission medications   Medication Sig Start Date End Date Taking? Authorizing Provider  aspirin EC 81 MG EC tablet Take 1 tablet (81 mg total) by mouth daily. Patient not taking: Reported on 12/06/2016 10/18/16   Leana Gamer, MD  ceFAZolin (ANCEF) 2-4 GM/100ML-% IVPB Inject 100 mLs (2 g total) into the vein every 8 (eight) hours. Patient not taking: Reported on 12/06/2016 11/21/16 12/19/16  Eber Jones, MD  folic acid (FOLVITE) 1 MG tablet Take 1 tablet (1 mg total) by mouth daily. 03/14/16   [name], FNP  hydroxyurea (HYDREA) 500 MG capsule Take 2 capsules (1,000 mg total) by mouth daily. May take with food to minimize GI side effects. 03/14/16   [name], FNP  levonorgestrel (MIRENA) 20 MCG/24HR IUD 1 each by Intrauterine route once.    Historical Provider, MD  loratadine (CLARITIN) 10 MG tablet Take 10 mg by mouth daily.    Historical Provider, MD  morphine (MS CONTIN) 15 MG 12 hr tablet Take 1 tablet (15 mg total) by mouth every 12 (twelve) hours. 11/01/16   Tresa Garter, MD  Multiple Vitamin (MULTIVITAMIN WITH MINERALS) TABS tablet Take 1 tablet by mouth daily.    Historical Provider, MD  Oxycodone HCl 10 MG TABS Take 1 tablet (10 mg total) by mouth every 4 (four) hours as needed. 11/01/16   Tresa Garter, MD  promethazine (PHENERGAN) 12.5 MG tablet Take 1 tablet (12.5 mg total) by mouth every 6 (six) hours as needed for nausea or vomiting. 11/01/16   Tresa Garter, MD  Vitamin D, Ergocalciferol, (DRISDOL) 50000 units CAPS capsule Take 1 capsule (50,000 Units total) by mouth every 7 (seven) days. Takes on Sundays. 06/01/16   Micheline Chapman, NP    Family History Family History  Problem Relation Age of Onset  . Sickle cell trait Mother   . Sickle cell trait Father   . Hypertension Father   . Diabetes Father   . Sickle cell trait Sister   . Cancer Paternal Aunt     Breast    Social History Social History  Substance Use Topics  . Smoking  status: Never Smoker  . Smokeless tobacco: Never Used  . Alcohol use No     Allergies   Desferal [deferoxamine]; Latex; Lisinopril; Tape; and Tylenol [acetaminophen]   Review of Systems Review of Systems  Constitutional: Negative for appetite change and fatigue.  HENT: Negative for congestion, ear discharge and sinus pressure.   Eyes: Negative for discharge.  Respiratory: Negative for cough.   Cardiovascular: Negative for chest pain.  Gastrointestinal: Negative for abdominal pain and diarrhea.  Genitourinary: Negative for frequency and hematuria.  Musculoskeletal: Negative for back pain.  Skin: Negative for rash.  Neurological: Negative for seizures and headaches.  Psychiatric/Behavioral: Negative for hallucinations.     Physical Exam Updated Vital Signs There were no vitals taken for this visit.  Physical Exam  Constitutional:  She is oriented to person, place, and time. She appears well-developed.  HENT:  Head: Normocephalic.  Eyes: Conjunctivae and EOM are normal. No scleral icterus.  Neck: Neck supple. No thyromegaly present.  Cardiovascular: Normal rate and regular rhythm.  Exam reveals no gallop and no friction rub.   No murmur heard. Pulmonary/Chest: No stridor. She has no wheezes. She has no rales. She exhibits no tenderness.  Abdominal: She exhibits no distension. There is no tenderness. There is no rebound.  Musculoskeletal: Normal range of motion. She exhibits no edema.  Lymphadenopathy:    She has no cervical adenopathy.  Neurological: She is oriented to person, place, and time. She exhibits normal muscle tone. Coordination normal.  Skin: No rash noted. No erythema.  Psychiatric: She has a normal mood and affect. Her behavior is normal.     ED Treatments / Results  DIAGNOSTIC STUDIES:    COORDINATION OF CARE: 8:05 AM- Pt advised of plan for treatment and pt agrees.  Labs (all labs ordered are listed, but only abnormal results are displayed) Labs  Reviewed - No data to display  EKG  EKG Interpretation None       Radiology No results found.  Procedures Procedures (including critical care time)  Medications Ordered in ED Medications - No data to display   Initial Impression / Assessment and Plan / ED Course  I have reviewed the triage vital signs and the nursing notes.  Pertinent labs & imaging results that were available during my care of the patient were reviewed by me and considered in my medical decision making (see chart for details).     Patient with sickle cell painful crisis. Patient improved with treatment and will follow-up as needed  Final Clinical Impressions(s) / ED Diagnoses   Final diagnoses:  None    New Prescriptions New Prescriptions   No medications on file   The chart was scribed for me under my direct supervision.  I personally performed the history, physical, and medical decision making and all procedures in the evaluation of this patient.[name], MD 12/09/16 [phone]

## 2016-12-10 ENCOUNTER — Non-Acute Institutional Stay (HOSPITAL_COMMUNITY)
Admission: AD | Admit: 2016-12-10 | Discharge: 2016-12-10 | Disposition: A | Discharge status: Home / Self Care | Payer: Medicare Other | Source: Ambulatory Visit | Attending: Internal Medicine | Admitting: Internal Medicine

## 2016-12-10 ENCOUNTER — Telehealth (HOSPITAL_COMMUNITY): Payer: Self-pay | Admitting: Hematology

## 2016-12-10 DIAGNOSIS — Z7982 Long term (current) use of aspirin: Secondary | ICD-10-CM | POA: Diagnosis not present

## 2016-12-10 DIAGNOSIS — Z79891 Long term (current) use of opiate analgesic: Secondary | ICD-10-CM | POA: Diagnosis not present

## 2016-12-10 DIAGNOSIS — D5700 Hb-SS disease with crisis, unspecified: Secondary | ICD-10-CM | POA: Diagnosis present

## 2016-12-10 DIAGNOSIS — Z79899 Other long term (current) drug therapy: Secondary | ICD-10-CM | POA: Insufficient documentation

## 2016-12-10 DIAGNOSIS — R52 Pain, unspecified: Secondary | ICD-10-CM | POA: Diagnosis present

## 2016-12-10 LAB — CBC
HCT: 21.9 % — ABNORMAL LOW (ref 36.0–46.0)
HEMOGLOBIN: 7.8 g/dL — AB (ref 12.0–15.0)
MCH: 34.5 pg — AB (ref 26.0–34.0)
MCHC: 35.6 g/dL (ref 30.0–36.0)
MCV: 96.9 fL (ref 78.0–100.0)
PLATELETS: 92 10*3/uL — AB (ref 150–400)
RBC: 2.26 MIL/uL — AB (ref 3.87–5.11)
RDW: 23 % — ABNORMAL HIGH (ref 11.5–15.5)
WBC: 6.3 10*3/uL (ref 4.0–10.5)

## 2016-12-10 MED ORDER — KETOROLAC TROMETHAMINE 30 MG/ML IJ SOLN
30.0000 mg | Freq: Once | INTRAMUSCULAR | Status: AC
  Administered 2016-12-10: 30 mg via INTRAVENOUS
  Filled 2016-12-10: qty 1

## 2016-12-10 MED ORDER — PROMETHAZINE HCL 25 MG/ML IJ SOLN
12.5000 mg | Freq: Four times a day (QID) | INTRAMUSCULAR | Status: DC | PRN
  Administered 2016-12-10: 12.5 mg via INTRAVENOUS
  Filled 2016-12-10 (×2): qty 1

## 2016-12-10 MED ORDER — ONDANSETRON HCL 4 MG/2ML IJ SOLN: 4.0000 mg | Freq: Four times a day (QID) | INTRAMUSCULAR | Status: DC | PRN

## 2016-12-10 MED ORDER — OXYCODONE HCL 5 MG PO TABS
10.0000 mg | ORAL_TABLET | Freq: Once | ORAL | Status: AC
Start: 2016-12-10 — End: 2016-12-10
  Administered 2016-12-10: 10 mg via ORAL
  Filled 2016-12-10: qty 2

## 2016-12-10 MED ORDER — KETOROLAC TROMETHAMINE 30 MG/ML IJ SOLN
30.0000 mg | Freq: Once | INTRAMUSCULAR | Status: DC
  Filled 2016-12-10: qty 1

## 2016-12-10 MED ORDER — DIPHENHYDRAMINE HCL 25 MG PO CAPS: 25.0000 mg | ORAL_CAPSULE | ORAL | Status: DC | PRN

## 2016-12-10 MED ORDER — NALOXONE HCL 0.4 MG/ML IJ SOLN: 0.4000 mg | INTRAMUSCULAR | Status: DC | PRN

## 2016-12-10 MED ORDER — HYDROMORPHONE 1 MG/ML IV SOLN
INTRAVENOUS | Status: DC
  Administered 2016-12-10: 9.8 mg via INTRAVENOUS
  Administered 2016-12-10: 12:00:00 via INTRAVENOUS
  Filled 2016-12-10 (×2): qty 25

## 2016-12-10 MED ORDER — SODIUM CHLORIDE 0.9% FLUSH: 9.0000 mL | INTRAVENOUS | Status: DC | PRN

## 2016-12-10 MED ORDER — SODIUM CHLORIDE 0.9 % IV SOLN
25.0000 mg | INTRAVENOUS | Status: DC | PRN
  Filled 2016-12-10: qty 0.5

## 2016-12-10 MED ORDER — DEXTROSE-NACL 5-0.45 % IV SOLN
INTRAVENOUS | Status: DC
  Administered 2016-12-10: 12:00:00 via INTRAVENOUS

## 2016-12-10 NOTE — Progress Notes (Signed)
This RN attempted to collect labs, unsuccessful x2 attempts.  Lab notified for phlebotomy.

## 2016-12-10 NOTE — Progress Notes (Signed)
Patient ID: Dawn [name], female   DOB: [birth year], 36 y.o.   MRN: [id number] Discharge instructions given to patient.  IV flushed and removed without difficulty.  Patient states pain improved to 5/10 on pain scale.  Patient has made f/u appointment for Wednesday with [place] Hollis, [place].

## 2016-12-10 NOTE — Discharge Instructions (Signed)
Sickle Cell Anemia, Adult °Sickle cell anemia is a condition where your red blood cells are shaped like sickles. Red blood cells carry oxygen through the body. Sickle-shaped red blood cells do not live as long as normal red blood cells. They also clump together and block blood from flowing through the blood vessels. These things prevent the body from getting enough oxygen. Sickle cell anemia causes organ damage and pain. It also increases the risk of infection. °Follow these instructions at home: °· Drink enough fluid to keep your pee (urine) clear or pale yellow. Drink more in hot weather and during exercise. °· Do not smoke. Smoking lowers oxygen levels in the blood. °· Only take over-the-counter or prescription medicines as told by your doctor. °· Take antibiotic medicines as told by your doctor. Make sure you finish them even if you start to feel better. °· Take supplements as told by your doctor. °· Consider wearing a medical alert bracelet. This tells anyone caring for you in an emergency of your condition. °· When traveling, keep your medical information, doctors' names, and the medicines you take with you at all times. °· If you have a fever, do not take fever medicines right away. This could cover up a problem. Tell your doctor. °· Keep all follow-up visits with your doctor. Sickle cell anemia requires regular medical care. °Contact a doctor if: °You have a fever. °Get help right away if: °· You feel dizzy or faint. °· You have new belly (abdominal) pain, especially on the left side near the stomach area. °· You have a lasting, often uncomfortable and painful erection of the penis (priapism). If it is not treated right away, you will become unable to have sex (impotence). °· You have numbness in your arms or legs or you have a hard time moving them. °· You have a hard time talking. °· You have a fever or lasting symptoms for more than 2-3 days. °· You have a fever and your symptoms suddenly get  worse. °· You have signs or symptoms of infection. These include: °? Chills. °? Being more tired than normal (lethargy). °? Irritability. °? Poor eating. °? Throwing up (vomiting). °· You have pain that is not helped with medicine. °· You have shortness of breath. °· You have pain in your chest. °· You are coughing up pus-like or bloody mucus. °· You have a stiff neck. °· Your feet or hands swell or have pain. °· Your belly looks bloated. °· Your joints hurt. °This information is not intended to replace advice given to you by your health care provider. Make sure you discuss any questions you have with your health care provider. °Document Released: 07/01/2013 Document Revised: 02/16/2016 Document Reviewed: 04/22/2013 °Elsevier Interactive Patient Education © 2017 [employer] ° °

## 2016-12-10 NOTE — Discharge Summary (Signed)
[facility] Discharge Summary   Patient ID: Dawn [name] MRN: [id number] DOB/AGE: 36-16-1982 36 y.o.  Admit date: 12/10/2016 Discharge date: 12/10/2016  Primary Care Physician:  [name], FNP  Admission Diagnoses:  Active Problems:   Sickle cell crisis [facility])   Discharge Medications:  Allergies as of 12/10/2016      Reactions   Desferal [deferoxamine] Hives   Local reaction on arm only during infusion. Can take with benadryl    Latex Other (See Comments)   REACTION: Pt experiences a burning sensation on contacted skin areas   Lisinopril Other (See Comments), Cough   REACTION: Sore/scratchy throat   Tape Other (See Comments)   REACTION: Pt. Experiences a burning sensation on contacted skin areas   Tylenol [acetaminophen] Other (See Comments)   Pt. Does not take the following due to protection of kidney health. Past occurrence of Protein in urine.      Medication List    TAKE these medications   aspirin 81 MG EC tablet Take 1 tablet (81 mg total) by mouth daily.   ceFAZolin 2-4 GM/100ML-% IVPB Commonly known as:  ANCEF Inject 100 mLs (2 g total) into the vein every 8 (eight) hours.   folic acid 1 MG tablet Commonly known as:  FOLVITE Take 1 tablet (1 mg total) by mouth daily.   hydroxyurea 500 MG capsule Commonly known as:  HYDREA Take 2 capsules (1,000 mg total) by mouth daily. May take with food to minimize GI side effects.   levonorgestrel 20 MCG/24HR IUD Commonly known as:  MIRENA 1 each by Intrauterine route once.   loratadine 10 MG tablet Commonly known as:  CLARITIN Take 10 mg by mouth daily.   morphine 15 MG 12 hr tablet Commonly known as:  MS CONTIN Take 1 tablet (15 mg total) by mouth every 12 (twelve) hours.   multivitamin with minerals Tabs tablet Take 1 tablet by mouth daily.   Oxycodone HCl 10 MG Tabs Take 1 tablet (10 mg total) by mouth every 4 (four) hours as needed.   promethazine 12.5 MG tablet Commonly known as:   PHENERGAN Take 1 tablet (12.5 mg total) by mouth every 6 (six) hours as needed for nausea or vomiting.   Vitamin D (Ergocalciferol) 50000 units Caps capsule Commonly known as:  DRISDOL Take 1 capsule (50,000 Units total) by mouth every 7 (seven) days. Takes on Sundays.        Consults:  None  Significant Diagnostic Studies:  Dg Chest 2 View  Result Date: 12/09/2016 CLINICAL DATA:  Chest pain for several weeks, worsening with sickle cell crisis. EXAM: CHEST  2 VIEW COMPARISON:  11/11/2016. FINDINGS: Borderline enlarged cardiac silhouette with an interval decrease in size. Mildly hyperexpanded lungs with mild diffuse peribronchial thickening and accentuation of the interstitial markings, without significant change. The right jugular porta catheter has been removed. No acute bony abnormality. IMPRESSION: No acute abnormality. Stable mild changes of COPD and chronic bronchitis. Electronically Signed   By: Claudie Revering M.D.   On: 12/09/2016 11:49   Dg Chest 2 View  Result Date: 11/11/2016 CLINICAL DATA:  Chest pain.  Sickle cell disease. EXAM: CHEST  2 VIEW COMPARISON:  Chest radiograph October 02, 2016 and chest CT October 03, 2016 FINDINGS: No edema or consolidation. Heart is mildly enlarged with pulmonary venous hypertension. No adenopathy evident. Port-A-Cath tip is in the superior vena cava. No pneumothorax. No bone lesions evident. IMPRESSION: Evidence of pulmonary vascular congestion without edema or consolidation. No evident  adenopathy. Port-A-Cath tip in superior vena cava. Electronically Signed   By: Lowella Grip III M.D.   On: 11/11/2016 12:01     [facility] Course: Pt was treated with IVF, Toradol and IV analgesics. Patient started on high concentration PCA dilaudid. She had a total use of 9.8 mg with 15 demands and 14 deliveries.  Pain has decreased from 10/10 to 5/10. Patient is alert, oriented, and ambulating. Patient will follow up in clinic on 12/12/2016 for  medication management.  Pt will be discharged home on current medication regimen.   The patient was given clear instructions to go to ER or return to medical center if symptoms do not improve, worsen or new problems develop. The patient verbalized understanding.  Physical Exam at Discharge:  BP 134/78 (BP Location: Right Arm)   Pulse 88   Temp 98.6 F (37 C) (Oral)   Resp 13   Ht 5\' 3"  (1.6 m)   Wt 163 lb (73.9 kg)   SpO2 97%   BMI 28.87 kg/m  BP 134/78 (BP Location: Right Arm)   Pulse 88   Temp 98.6 F (37 C) (Oral)   Resp 13   Ht 5\' 3"  (1.6 m)   Wt 163 lb (73.9 kg)   SpO2 97%   BMI 28.87 kg/m   General Appearance:    Alert, cooperative, no distress, appears stated age  Head:    Normocephalic, without obvious abnormality, atraumatic  Eyes:    PERRL, conjunctiva/corneas clear, EOM's intact, fundi    benign, Bilateral icterus  Back:     Symmetric, no curvature, ROM normal, no CVA tenderness  Lungs:     Clear to auscultation bilaterally, respirations unlabored  Chest Wall:    No tenderness or deformity   Heart:    Regular rate and rhythm, S1 and S2 normal, no murmur, rub   or gallop  Extremities:   Extremities normal, atraumatic, no cyanosis or edema  Pulses:   2+ and symmetric all extremities  Skin:   Skin color, texture, turgor normal, no rashes or lesions  Lymph nodes:   Cervical, supraclavicular, and axillary nodes normal  Neurologic:   CNII-XII intact, normal strength, sensation and reflexes    throughout    Disposition at Discharge: 01-Home or Self Care  Discharge Orders: Discharge Instructions    Discharge patient    Complete by:  As directed    Discharge disposition:  01-Home or Self Care   Discharge patient date:  12/10/2016      Condition at Discharge:   Stable  Time spent on Discharge:  15 minutes  Signed: Hollis,Lachina M 12/10/2016, 4:36 PM

## 2016-12-10 NOTE — Discharge Summary (Signed)
[facility] Discharge Summary   Patient ID: NYCHELLE CASSATA MRN: [id number] DOB/AGE: [birth year] 36 y.o.  Admit date: 12/10/2016 Discharge date: 12/10/2016  Primary Care Physician:  [name], FNP  Admission Diagnoses:  Active Problems:   Sickle cell crisis [facility])  Discharge Medications:  Allergies as of 12/10/2016      Reactions   Desferal [deferoxamine] Hives   Local reaction on arm only during infusion. Can take with benadryl    Latex Other (See Comments)   REACTION: Pt experiences a burning sensation on contacted skin areas   Lisinopril Other (See Comments), Cough   REACTION: Sore/scratchy throat   Tape Other (See Comments)   REACTION: Pt. Experiences a burning sensation on contacted skin areas   Tylenol [acetaminophen] Other (See Comments)   Pt. Does not take the following due to protection of kidney health. Past occurrence of Protein in urine.      Medication List    TAKE these medications   aspirin 81 MG EC tablet Take 1 tablet (81 mg total) by mouth daily.   ceFAZolin 2-4 GM/100ML-% IVPB Commonly known as:  ANCEF Inject 100 mLs (2 g total) into the vein every 8 (eight) hours.   folic acid 1 MG tablet Commonly known as:  FOLVITE Take 1 tablet (1 mg total) by mouth daily.   hydroxyurea 500 MG capsule Commonly known as:  HYDREA Take 2 capsules (1,000 mg total) by mouth daily. May take with food to minimize GI side effects.   levonorgestrel 20 MCG/24HR IUD Commonly known as:  MIRENA 1 each by Intrauterine route once.   loratadine 10 MG tablet Commonly known as:  CLARITIN Take 10 mg by mouth daily.   morphine 15 MG 12 hr tablet Commonly known as:  MS CONTIN Take 1 tablet (15 mg total) by mouth every 12 (twelve) hours.   multivitamin with minerals Tabs tablet Take 1 tablet by mouth daily.   Oxycodone HCl 10 MG Tabs Take 1 tablet (10 mg total) by mouth every 4 (four) hours as needed.   promethazine 12.5 MG tablet Commonly known as:   PHENERGAN Take 1 tablet (12.5 mg total) by mouth every 6 (six) hours as needed for nausea or vomiting.   Vitamin D (Ergocalciferol) 50000 units Caps capsule Commonly known as:  DRISDOL Take 1 capsule (50,000 Units total) by mouth every 7 (seven) days. Takes on Sundays.        Consults:  None  Significant Diagnostic Studies:  Dg Chest 2 View  Result Date: 12/09/2016 CLINICAL DATA:  Chest pain for several weeks, worsening with sickle cell crisis. EXAM: CHEST  2 VIEW COMPARISON:  11/11/2016. FINDINGS: Borderline enlarged cardiac silhouette with an interval decrease in size. Mildly hyperexpanded lungs with mild diffuse peribronchial thickening and accentuation of the interstitial markings, without significant change. The right jugular porta catheter has been removed. No acute bony abnormality. IMPRESSION: No acute abnormality. Stable mild changes of COPD and chronic bronchitis. Electronically Signed   By: Claudie Revering M.D.   On: 12/09/2016 11:49   Dg Chest 2 View  Result Date: 11/11/2016 CLINICAL DATA:  Chest pain.  Sickle cell disease. EXAM: CHEST  2 VIEW COMPARISON:  Chest radiograph October 02, 2016 and chest CT October 03, 2016 FINDINGS: No edema or consolidation. Heart is mildly enlarged with pulmonary venous hypertension. No adenopathy evident. Port-A-Cath tip is in the superior vena cava. No pneumothorax. No bone lesions evident. IMPRESSION: Evidence of pulmonary vascular congestion without edema or consolidation. No evident adenopathy.  Port-A-Cath tip in superior vena cava. Electronically Signed   By: Lowella Grip III M.D.   On: 11/11/2016 12:01     [facility] Course: Afia Messenger was admitted to the day infusion center for extended observation. She was started on high concentration PCA dilaudid. She was also given Oxycodone 10 mg per home dose 40 minutes prior to discharge. Pain intensity decreased from 10/10 to 5/10. Patient states that she can manage at home on current  medication regimen.  Ms. Verley is scheduled to follow up in primary care for medication management on 12/12/2016.  Patient reminded to increase hydration and take home medications as previously prescribed.  The patient was given clear instructions to go to ER or return to medical center if symptoms do not improve, worsen or new problems develop. The patient verbalized understanding.  Physical Exam at Discharge:   BP 134/78 (BP Location: Right Arm)   Pulse 88   Temp 98.6 F (37 C) (Oral)   Resp 13   Ht 5\' 3"  (1.6 m)   Wt 163 lb (73.9 kg)   SpO2 97%   BMI 28.87 kg/m   General Appearance:    Alert, cooperative, no distress, appears stated age  Head:    Normocephalic, without obvious abnormality, atraumatic  Neck:   Supple, symmetrical, trachea midline, no adenopathy;    thyroid:  no enlargement/tenderness/nodules; no carotid   bruit or JVD  Back:     Symmetric, no curvature, ROM normal, no CVA tenderness  Lungs:     Clear to auscultation bilaterally, respirations unlabored  Chest Wall:    No tenderness or deformity   Heart:    Regular rate and rhythm, S1 and S2 normal, no murmur, rub   or gallop  Extremities:   Extremities normal, atraumatic, no cyanosis or edema  Pulses:   2+ and symmetric all extremities  Skin:   Skin color, texture, turgor normal, no rashes or lesions  Lymph nodes:   Cervical, supraclavicular, and axillary nodes normal  Neurologic:   CNII-XII intact, normal strength, sensation and reflexes    throughout    Disposition at Discharge: 06-Home-Health Care Svc  Discharge Orders:   Condition at Discharge:   Stable  Time spent on Discharge:  Greater than 30 minutes.  Signed: Clevon Khader M 12/10/2016, 3:54 PM

## 2016-12-10 NOTE — Progress Notes (Signed)
Lab came to draw ordered labs.  Phlebotomist was only able to draw CBC.  Unable to collect other labs.  Will notify provider.

## 2016-12-10 NOTE — H&P (Signed)
[facility] History and Physical   Date: 12/10/2016  Patient name: Dawn [name] Medical record number: [id number] Date of birth: [birth year] Age: 36 y.o. Gender: female PCP: [name], FNP  Attending physician: Tresa Garter, MD  Chief Complaint: Generalized pain  History of Present Illness: [name], a 36 year old female with a history of sickle cell anemia, HbSS presents complaining of pain to lower extremities. Terra has had frequent emergency room and inpatient visits over the past several months. She attributes current pain crisis to changes in weather. Current pain intensity is 10/10 described as constant and throbbing. She last had Oxycodone around 6 am without sustained relief. She denies headache, shortness of breath, paresthesias, dysuria,  nausea, vomiting, or diarrhea.   Meds: Prescriptions Prior to Admission  Medication Sig Dispense Refill Last Dose  . aspirin EC 81 MG EC tablet Take 1 tablet (81 mg total) by mouth daily. 30 tablet 0 12/09/2016 at Unknown time  . ceFAZolin (ANCEF) 2-4 GM/100ML-% IVPB Inject 100 mLs (2 g total) into the vein every 8 (eight) hours. (Patient not taking: Reported on 12/06/2016) 1 each 0 Not Taking at Unknown time  . folic acid (FOLVITE) 1 MG tablet Take 1 tablet (1 mg total) by mouth daily. 30 tablet 11 12/09/2016 at Unknown time  . hydroxyurea (HYDREA) 500 MG capsule Take 2 capsules (1,000 mg total) by mouth daily. May take with food to minimize GI side effects. 60 capsule 11 12/09/2016 at Unknown time  . levonorgestrel (MIRENA) 20 MCG/24HR IUD 1 each by Intrauterine route once.   unknown  . loratadine (CLARITIN) 10 MG tablet Take 10 mg by mouth daily.   12/09/2016 at Unknown time  . morphine (MS CONTIN) 15 MG 12 hr tablet Take 1 tablet (15 mg total) by mouth every 12 (twelve) hours. 60 tablet 0 12/09/2016 at Unknown time  . Multiple Vitamin (MULTIVITAMIN WITH MINERALS) TABS tablet Take 1 tablet by mouth daily.   12/09/2016 at  Unknown time  . Oxycodone HCl 10 MG TABS Take 1 tablet (10 mg total) by mouth every 4 (four) hours as needed. 90 tablet 0 12/09/2016 at Unknown time  . promethazine (PHENERGAN) 12.5 MG tablet Take 1 tablet (12.5 mg total) by mouth every 6 (six) hours as needed for nausea or vomiting. 30 tablet 0 12/09/2016 at Unknown time  . Vitamin D, Ergocalciferol, (DRISDOL) 50000 units CAPS capsule Take 1 capsule (50,000 Units total) by mouth every 7 (seven) days. Takes on Sundays. 4 capsule 11 12/09/2016 at Unknown time    Allergies: Desferal [deferoxamine]; Latex; Lisinopril; Tape; and Tylenol [acetaminophen] Past Medical History:  Diagnosis Date  . Blood transfusion   . Demand ischemia ([place]) 02/06/2016  . HCAP (healthcare-associated pneumonia) 05/19/2013  . 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 Pulmonary hypertension 02/06/2016   49 mmHg per echo on 02/06/16   . 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 Sickle cell anemia (HCC)   . Sickle cell disease ([place])   . Sickle cell disease, type S ([place])   . Vitamin B12 deficiency 07/07/2015   Past Surgical History:  Procedure Laterality Date  .  left knee ACL reconstruction    . CESAREAN SECTION     x 2  . CHOLECYSTECTOMY    . IR GENERIC HISTORICAL  07/17/2016   IR FLUORO GUIDE PORT INSERTION RIGHT 07/17/2016 Aletta Edouard, MD [facility]  . IR GENERIC HISTORICAL  07/17/2016   IR US GUIDE VASC ACCESS RIGHT 07/17/2016 Aletta Edouard, MD [facility]  . IR GENERIC HISTORICAL  10/12/2016   IR PTA VENOUS EXCEPT DIALYSIS CIRCUIT 10/12/2016 Arne Cleveland, MD [facility]  . IR GENERIC HISTORICAL  10/12/2016   IR US GUIDE VASC ACCESS RIGHT 10/12/2016 Arne Cleveland, MD [facility]  . IR GENERIC HISTORICAL  10/12/2016   IR VENO/EXT/UNI RIGHT 10/12/2016 Arne Cleveland, MD [facility]  . PORT-A-CATH REMOVAL Left 07/29/2013   Procedure: REMOVAL PORT-A-CATH;  Surgeon: Scherry Ran, MD;  Location: [facility];  Service: General;  Laterality: Left;  .  PORT-A-CATH REMOVAL Right 11/14/2016   Procedure: REMOVAL PORT-A-CATH;  Surgeon: Aviva Signs, MD;  Location: [facility];  Service: General;  Laterality: Right;  . PORTACATH PLACEMENT    . PORTACATH PLACEMENT Left 02/23/2013   Procedure: INSERTION PORT-A-CATH;  Surgeon: Donato Heinz, MD;  Location: [facility];  Service: General;  Laterality: Left;  . TEE WITHOUT CARDIOVERSION N/A 08/07/2013   Procedure: TRANSESOPHAGEAL ECHOCARDIOGRAM (TEE);  Surgeon: Arnoldo Lenis, MD;  Location: [facility];  Service: Cardiology;  Laterality: N/A;   Family History  Problem Relation Age of Onset  . Sickle cell trait Mother   . Sickle cell trait Father   . Hypertension Father   . Diabetes Father   . Sickle cell trait Sister   . Cancer Paternal Aunt     Breast   Social History   Social History  . Marital status: Divorced    Spouse name: N/A  . Number of children: N/A  . Years of education: N/A   Occupational History  . Not on file.   Social History Main Topics  . Smoking status: Never Smoker  . Smokeless tobacco: Never Used  . Alcohol use No  . Drug use: No  . Sexual activity: Yes    Birth control/ protection: IUD   Other Topics Concern  . Not on file   Social History Narrative  . No narrative on file    Review of Systems: Constitutional: negative for fatigue Eyes: positive for icterus Ears, nose, mouth, throat, and face: negative Respiratory: negative for asthma, cough, dyspnea on exertion and wheezing Cardiovascular: negative for chest pain, dyspnea, fatigue and irregular heart beat Gastrointestinal: negative for abdominal pain, constipation and nausea Genitourinary:negative for dysuria Hematologic/lymphatic: negative Musculoskeletal:positive for arthralgias and myalgias Neurological: negative for dizziness, gait problems, headaches and paresthesia Behavioral/Psych: negative Endocrine: negative  Physical Exam: There were no vitals taken for this visit. BP 134/78 (BP Location:  Right Arm)   Pulse 88   Temp 98.6 F (37 C) (Oral)   Resp 13   Ht 5\' 3"  (1.6 m)   Wt 163 lb (73.9 kg)   SpO2 97%   BMI 28.87 kg/m   General Appearance:    Alert, cooperative, no distress, appears stated age  Head:    Normocephalic, without obvious abnormality, atraumatic  Eyes:    PERRL, conjunctiva/corneas clear, EOM's intact, fundi    benign, both eyes  Ears:    Normal TM's and external ear canals, both ears  Nose:   Nares normal, septum midline, mucosa normal, no drainage    or sinus tenderness  Throat:   Lips, mucosa, and tongue normal; teeth and gums normal  Neck:   Supple, symmetrical, trachea midline, no adenopathy;    thyroid:  no enlargement/tenderness/nodules; no carotid   bruit or JVD  Back:     Symmetric, no curvature, ROM normal, no CVA tenderness  Lungs:     Clear to auscultation bilaterally, respirations unlabored  Chest Wall:  No tenderness or deformity   Heart:    Regular rate and rhythm, S1 and S2 normal, no murmur, rub   or gallop  Abdomen:     Soft, non-tender, bowel sounds active all four quadrants,    no masses, no organomegaly  Extremities:   Extremities normal, atraumatic, no cyanosis or edema  Pulses:   2+ and symmetric all extremities  Skin:   Skin color, texture, turgor normal, no rashes or lesions  Lymph nodes:   Cervical, supraclavicular, and axillary nodes normal  Neurologic:   CNII-XII intact, normal strength, sensation and reflexes    throughout    Lab results: Results for orders placed or performed during the hospital encounter of 12/09/16 (from the past 24 hour(s))  Urinalysis, Routine w reflex microscopic     Status: Abnormal   Collection Time: 12/09/16 11:45 AM  Result Value Ref Range   Color, Urine AMBER (A) YELLOW   APPearance CLEAR CLEAR   Specific Gravity, Urine 1.009 1.005 - 1.030   pH 7.0 5.0 - 8.0   Glucose, UA NEGATIVE NEGATIVE mg/dL   Hgb urine dipstick MODERATE (A) NEGATIVE   Bilirubin Urine SMALL (A) NEGATIVE   Ketones,  ur NEGATIVE NEGATIVE mg/dL   Protein, ur 100 (A) NEGATIVE mg/dL   Nitrite NEGATIVE NEGATIVE   Leukocytes, UA NEGATIVE NEGATIVE  Urinalysis, Microscopic (reflex)     Status: Abnormal   Collection Time: 12/09/16 11:45 AM  Result Value Ref Range   RBC / HPF 0-5 0 - 5 RBC/hpf   WBC, UA 0-5 0 - 5 WBC/hpf   Bacteria, UA NONE SEEN NONE SEEN   Squamous Epithelial / LPF 0-5 (A) NONE SEEN   Mucous PRESENT     Imaging results:  Dg Chest 2 View  Result Date: 12/09/2016 CLINICAL DATA:  Chest pain for several weeks, worsening with sickle cell crisis. EXAM: CHEST  2 VIEW COMPARISON:  11/11/2016. FINDINGS: Borderline enlarged cardiac silhouette with an interval decrease in size. Mildly hyperexpanded lungs with mild diffuse peribronchial thickening and accentuation of the interstitial markings, without significant change. The right jugular porta catheter has been removed. No acute bony abnormality. IMPRESSION: No acute abnormality. Stable mild changes of COPD and chronic bronchitis. Electronically Signed   By: Claudie Revering M.D.   On: 12/09/2016 11:49     Assessment & Plan:  Patient will be admitted to the day infusion center for extended observation  Start IV D5.45 for cellular rehydration at 150/hr  Start Toradol 30 mg IV times 1   Start Dilaudid PCA High Concentration per weight based protocol.   Patient will be re-evaluated for pain intensity in the context of function and relationship to baseline as care progresses.  If no significant pain relief, will transfer patient to inpatient services for a higher level of care.   Reviewed labs from emergency department form 3/18, hemoglobin is 8.2, will repeat CBC.    Soha Thorup M 12/10/2016, 10:02 AM

## 2016-12-10 NOTE — Telephone Encounter (Signed)
Patient C/O pain to arms, legs and back.  Patient rates pain 8/10 on pain scale.  Patient denies chest pain, shortness of breath, N/V/D.  Patient states she has taken her home medications without improvement.  Placed caller on hold and spoke with provider. / Per Dawn Hollis, FNP patient can come to [facility]. / Patient verbalizes understanding.

## 2016-12-12 ENCOUNTER — Ambulatory Visit: Payer: Medicare Other | Admitting: Family Medicine

## 2016-12-12 ENCOUNTER — Encounter (HOSPITAL_COMMUNITY): Payer: Self-pay | Admitting: Cardiology

## 2016-12-12 ENCOUNTER — Emergency Department (HOSPITAL_COMMUNITY)
Admission: EM | Admit: 2016-12-12 | Discharge: 2016-12-12 | Disposition: A | Discharge status: Home / Self Care | Payer: Medicare Other | Attending: Emergency Medicine | Admitting: Emergency Medicine

## 2016-12-12 DIAGNOSIS — Z79899 Other long term (current) drug therapy: Secondary | ICD-10-CM | POA: Diagnosis not present

## 2016-12-12 DIAGNOSIS — D57219 Sickle-cell/Hb-C disease with crisis, unspecified: Secondary | ICD-10-CM | POA: Diagnosis not present

## 2016-12-12 DIAGNOSIS — M545 Low back pain: Secondary | ICD-10-CM | POA: Diagnosis present

## 2016-12-12 DIAGNOSIS — D5700 Hb-SS disease with crisis, unspecified: Secondary | ICD-10-CM | POA: Diagnosis not present

## 2016-12-12 LAB — CBC WITH DIFFERENTIAL/PLATELET
BASOS PCT: 0 %
Basophils Absolute: 0 10*3/uL (ref 0.0–0.1)
EOS PCT: 2 %
Eosinophils Absolute: 0.1 10*3/uL (ref 0.0–0.7)
HCT: 23.3 % — ABNORMAL LOW (ref 36.0–46.0)
Hemoglobin: 8.6 g/dL — ABNORMAL LOW (ref 12.0–15.0)
Lymphocytes Relative: 24 %
Lymphs Abs: 1.7 10*3/uL (ref 0.7–4.0)
MCH: 36.9 pg — AB (ref 26.0–34.0)
MCHC: 36.9 g/dL — AB (ref 30.0–36.0)
MCV: 100 fL (ref 78.0–100.0)
MONO ABS: 0.9 10*3/uL (ref 0.1–1.0)
Monocytes Relative: 13 %
Neutro Abs: 4.4 10*3/uL (ref 1.7–7.7)
Neutrophils Relative %: 61 %
Platelets: 136 10*3/uL — ABNORMAL LOW (ref 150–400)
RBC: 2.33 MIL/uL — ABNORMAL LOW (ref 3.87–5.11)
RDW: 23.6 % — AB (ref 11.5–15.5)
WBC: 7.2 10*3/uL (ref 4.0–10.5)

## 2016-12-12 LAB — BASIC METABOLIC PANEL
ANION GAP: 5 (ref 5–15)
BUN: <5 mg/dL — ABNORMAL LOW (ref 6–20)
CALCIUM: 8.3 mg/dL — AB (ref 8.9–10.3)
CHLORIDE: 105 mmol/L (ref 101–111)
CO2: 25 mmol/L (ref 22–32)
Creatinine, Ser: 0.54 mg/dL (ref 0.44–1.00)
GFR calc Af Amer: >60 mL/min (ref >60)
GFR calc non Af Amer: >60 mL/min (ref >60)
GLUCOSE: 113 mg/dL — AB (ref 65–99)
Potassium: 3.1 mmol/L — ABNORMAL LOW (ref 3.5–5.1)
Sodium: 135 mmol/L (ref 135–145)

## 2016-12-12 LAB — RETICULOCYTES
RBC.: 2.33 MIL/uL — ABNORMAL LOW (ref 3.87–5.11)
RETIC CT PCT: 17.3 % — AB (ref 0.4–3.1)
Retic Count, Absolute: 403.1 10*3/uL — ABNORMAL HIGH (ref 19.0–186.0)

## 2016-12-12 MED ORDER — SODIUM CHLORIDE 0.45 % IV SOLN
INTRAVENOUS | Status: DC
  Administered 2016-12-12: 10:00:00 via INTRAVENOUS

## 2016-12-12 MED ORDER — HYDROMORPHONE HCL 2 MG/ML IJ SOLN
2.0000 mg | INTRAMUSCULAR | Status: AC
  Administered 2016-12-12: 2 mg via INTRAVENOUS
  Filled 2016-12-12: qty 1

## 2016-12-12 MED ORDER — HYDROMORPHONE HCL 2 MG/ML IJ SOLN: 2.0000 mg | INTRAMUSCULAR | Status: AC

## 2016-12-12 MED ORDER — ONDANSETRON HCL 4 MG/2ML IJ SOLN
4.0000 mg | INTRAMUSCULAR | Status: DC | PRN
Start: 2016-12-12 — End: 2016-12-12
  Administered 2016-12-12: 4 mg via INTRAVENOUS
  Filled 2016-12-12: qty 2

## 2016-12-12 MED ORDER — KETOROLAC TROMETHAMINE 30 MG/ML IJ SOLN
30.0000 mg | INTRAMUSCULAR | Status: AC
  Administered 2016-12-12: 30 mg via INTRAVENOUS
  Filled 2016-12-12: qty 1

## 2016-12-12 MED ORDER — HYDROMORPHONE HCL 2 MG/ML IJ SOLN: 2.0000 mg | INTRAMUSCULAR | Status: DC

## 2016-12-12 MED ORDER — HYDROMORPHONE HCL 2 MG/ML IJ SOLN
2.0000 mg | INTRAMUSCULAR | Status: DC
  Administered 2016-12-12 (×3): 2 mg via INTRAVENOUS
  Filled 2016-12-12 (×3): qty 1

## 2016-12-12 NOTE — ED Provider Notes (Signed)
[facility] DEPT Provider Note   CSN: [id number] Arrival date & time: 12/12/16  0102   By signing my name below, I, [name], attest that this documentation has been prepared under the direction and in the presence of Daleen Bo, MD. Electronically Signed: [name], Scribe. 12/12/16. 10:08 AM. History   Chief Complaint Chief Complaint  Patient presents with  . Sickle Cell Pain Crisis    The history is provided by the patient. No language interpreter was used.  HPI Comments: Dawn [name] is a 36 y.o. female who presents to the Emergency Department complaining of legs, back, and arm pain since 6 pm last night. The patient states that she has sickle cell disease and this pain is normal for her during her episodes. She states that she was going to her sickle cell clinic today but they were closed due to the weather. The patient states that her last flair up was a couple of weeks ago. The patient also reports some joint swelling in her legs. She denies headache, difficulty breathing, fever, CP, and SOB. She states that she took her daily pain meds this morning with no relief. The patient states that she is going to be started on a new medication called Endari by Dr. Christella Noa.   Past Medical History:  Diagnosis Date  . Blood transfusion   . Demand ischemia ([place]) 02/06/2016  . HCAP (healthcare-associated pneumonia) 05/19/2013  . 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 Pulmonary hypertension 02/06/2016   49 mmHg per echo on 02/06/16   . 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 Sickle cell anemia (HCC)   . Sickle cell disease ([place])   . 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 Vitamin B12 deficiency 07/07/2015    Patient Active Problem List   Diagnosis Date Noted  . Sickle cell anemia with pain ([place]) 11/06/2016  . Syncope 10/17/2016  . Dizziness   . Hypomagnesemia   . Swelling of right upper extremity   . 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 Sickle cell crisis ([place]) 10/03/2016    . Positive D dimer   . Pulmonary hypertension 02/16/2016  . 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 Anemia of chronic disease   . Chronic pain syndrome 10/14/2015  . 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 Vitamin B12 deficiency 07/07/2015  . Thrombocytopenia ([place]) 07/06/2015  . Hyperbilirubinemia 09/11/2014  . Macrocytosis 06/13/2014  . Vitamin D deficiency 11/23/2013  . 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 Hypokalemia 03/28/2012  . 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 Elevated LFTs 04/27/2011  . Sickle cell disease ([place]) 04/26/2011    Past Surgical History:  Procedure Laterality Date  .  left knee ACL reconstruction    . CESAREAN SECTION     x 2  . CHOLECYSTECTOMY    . IR GENERIC HISTORICAL  07/17/2016   IR FLUORO GUIDE PORT INSERTION RIGHT 07/17/2016 Aletta Edouard, MD [facility]  . IR GENERIC HISTORICAL  07/17/2016   IR US GUIDE VASC ACCESS RIGHT 07/17/2016 Aletta Edouard, MD [facility]  . IR GENERIC HISTORICAL  10/12/2016   IR PTA VENOUS EXCEPT DIALYSIS CIRCUIT 10/12/2016 Arne Cleveland, MD [facility]  . IR GENERIC HISTORICAL  10/12/2016   IR US GUIDE VASC ACCESS RIGHT 10/12/2016 Arne Cleveland, MD [facility]  . IR GENERIC HISTORICAL  10/12/2016   IR VENO/EXT/UNI RIGHT 10/12/2016 Arne Cleveland, MD [facility]  . PORT-A-CATH REMOVAL Left 07/29/2013   Procedure: REMOVAL PORT-A-CATH;  Surgeon: Grace Bushy  Romona Curls, MD;  Location: [facility];  Service: General;  Laterality: Left;  . PORT-A-CATH REMOVAL Right 11/14/2016   Procedure: REMOVAL PORT-A-CATH;  Surgeon: Aviva Signs, MD;  Location: [facility];  Service: General;  Laterality: Right;  . PORTACATH PLACEMENT    . PORTACATH PLACEMENT Left 02/23/2013   Procedure: INSERTION PORT-A-CATH;  Surgeon: Donato Heinz, MD;  Location: [facility];  Service: General;  Laterality: Left;  . TEE WITHOUT CARDIOVERSION N/A 08/07/2013   Procedure: TRANSESOPHAGEAL  ECHOCARDIOGRAM (TEE);  Surgeon: Arnoldo Lenis, MD;  Location: [facility];  Service: Cardiology;  Laterality: N/A;    OB History    Gravida Para Term Preterm AB Living   1 1   1   1    SAB TAB Ectopic Multiple Live Births                   Home Medications    Prior to Admission medications   Medication Sig Start Date End Date Taking? Authorizing Provider  folic acid (FOLVITE) 1 MG tablet Take 1 tablet (1 mg total) by mouth daily. 03/14/16  Yes [name], FNP  hydroxyurea (HYDREA) 500 MG capsule Take 2 capsules (1,000 mg total) by mouth daily. May take with food to minimize GI side effects. 03/14/16  Yes [name], FNP  levonorgestrel (MIRENA) 20 MCG/24HR IUD 1 each by Intrauterine route once.   Yes Historical Provider, MD  loratadine (CLARITIN) 10 MG tablet Take 10 mg by mouth daily.   Yes Historical Provider, MD  morphine (MS CONTIN) 15 MG 12 hr tablet Take 1 tablet (15 mg total) by mouth every 12 (twelve) hours. 11/01/16  Yes Tresa Garter, MD  Multiple Vitamin (MULTIVITAMIN WITH MINERALS) TABS tablet Take 1 tablet by mouth daily.   Yes Historical Provider, MD  Oxycodone HCl 10 MG TABS Take 1 tablet (10 mg total) by mouth every 4 (four) hours as needed. 11/01/16  Yes Tresa Garter, MD  promethazine (PHENERGAN) 12.5 MG tablet Take 1 tablet (12.5 mg total) by mouth every 6 (six) hours as needed for nausea or vomiting. 11/01/16  Yes Tresa Garter, MD  Vitamin D, Ergocalciferol, (DRISDOL) 50000 units CAPS capsule Take 1 capsule (50,000 Units total) by mouth every 7 (seven) days. Takes on Sundays. 06/01/16  Yes Micheline Chapman, NP  aspirin EC 81 MG EC tablet Take 1 tablet (81 mg total) by mouth daily. Patient not taking: Reported on 12/12/2016 10/18/16   Leana Gamer, MD  ceFAZolin (ANCEF) 2-4 GM/100ML-% IVPB Inject 100 mLs (2 g total) into the vein every 8 (eight) hours. Patient not taking: Reported on 12/12/2016 11/21/16 12/19/16  Eber Jones, MD     Family History Family History  Problem Relation Age of Onset  . Sickle cell trait Mother   . Sickle cell trait Father   . Hypertension Father   . Diabetes Father   . Sickle cell trait Sister   . Cancer Paternal Aunt     Breast    Social History Social History  Substance Use Topics  . Smoking status: Never Smoker  . Smokeless tobacco: Never Used  . Alcohol use No     Allergies   Desferal [deferoxamine]; Latex; Lisinopril; Tape; and Tylenol [acetaminophen]   Review of Systems Review of Systems  Constitutional: Negative for fever.  Respiratory: Negative for shortness of breath.   Cardiovascular: Negative for chest pain.  Gastrointestinal: Negative for abdominal pain and nausea.  Musculoskeletal: Positive for back pain, joint  swelling and myalgias.       Arm and leg pain  Neurological: Negative for headaches.  All other systems reviewed and are negative.    Physical Exam Updated Vital Signs BP (!) 157/94   Pulse 85   Temp 98 F (36.7 C) (Oral)   Resp 13   Ht 5\' 3"  (1.6 m)   Wt 163 lb (73.9 kg)   SpO2 99%   BMI 28.87 kg/m   Physical Exam  Constitutional: She is oriented to person, place, and time. She appears well-developed and well-nourished.  HENT:  Head: Normocephalic and atraumatic.  Eyes: Conjunctivae and EOM are normal. Pupils are equal, round, and reactive to light. Scleral icterus is present.  Neck: Normal range of motion and phonation normal. Neck supple.  Cardiovascular: Normal rate, regular rhythm and normal heart sounds.  Exam reveals no gallop and no friction rub.   No murmur heard. Pulmonary/Chest: Effort normal and breath sounds normal. She exhibits no tenderness.  Abdominal: Soft. She exhibits no distension. There is no tenderness. There is no guarding.  Musculoskeletal: Normal range of motion. She exhibits tenderness.  Lumbar-sacral TTP. TTP in her bilateral legs. Lower extremity strength is good. Full ROM in shoulder.  Neurological:  She is alert and oriented to person, place, and time. She exhibits normal muscle tone.  Skin: Skin is warm and dry.  Psychiatric: She has a normal mood and affect. Her behavior is normal. Judgment and thought content normal.  Nursing note and vitals reviewed.   ED Treatments / Results  DIAGNOSTIC STUDIES: Oxygen Saturation is 91% on RA, low by my interpretation.    COORDINATION OF CARE: 9:40 AM Discussed treatment plan with pt at bedside and pt agreed to plan. I will check the patient's labs.  Labs (all labs ordered are listed, but only abnormal results are displayed) Labs Reviewed  CBC WITH DIFFERENTIAL/PLATELET - Abnormal; Notable for the following:       Result Value   RBC 2.33 (*)    Hemoglobin 8.6 (*)    HCT 23.3 (*)    MCH 36.9 (*)    MCHC 36.9 (*)    RDW 23.6 (*)    Platelets 136 (*)    All other components within normal limits  RETICULOCYTES - Abnormal; Notable for the following:    Retic Ct Pct 17.3 (*)    RBC. 2.33 (*)    Retic Count, Manual 403.1 (*)    All other components within normal limits  BASIC METABOLIC PANEL - Abnormal; Notable for the following:    Potassium 3.1 (*)    Glucose, Bld 113 (*)    BUN <5 (*)    Calcium 8.3 (*)    All other components within normal limits    EKG  EKG Interpretation None       Radiology No results found.  Procedures Procedures (including critical care time)  Medications Ordered in ED Medications  0.45 % sodium chloride infusion ( Intravenous New Bag/Given 12/12/16 1027)  HYDROmorphone (DILAUDID) injection 2 mg (2 mg Intravenous Not Given 12/12/16 1121)    Or  HYDROmorphone (DILAUDID) injection 2 mg ( Subcutaneous See Alternative 12/12/16 1121)  HYDROmorphone (DILAUDID) injection 2 mg (2 mg Intravenous Given 12/12/16 1342)    Or  HYDROmorphone (DILAUDID) injection 2 mg ( Subcutaneous See Alternative 12/12/16 1342)  ondansetron (ZOFRAN) injection 4 mg (4 mg Intravenous Given 12/12/16 1027)  ketorolac (TORADOL) 30  MG/ML injection 30 mg (30 mg Intravenous Given 12/12/16 1027)  HYDROmorphone (DILAUDID) injection 2  mg (2 mg Intravenous Given 12/12/16 1027)    Or  HYDROmorphone (DILAUDID) injection 2 mg ( Subcutaneous See Alternative 12/12/16 1027)  HYDROmorphone (DILAUDID) injection 2 mg (2 mg Intravenous Given 12/12/16 1136)    Or  HYDROmorphone (DILAUDID) injection 2 mg ( Subcutaneous See Alternative 12/12/16 1136)     Initial Impression / Assessment and Plan / ED Course  I have reviewed the triage vital signs and the nursing notes.  Pertinent labs & imaging results that were available during my care of the patient were reviewed by me and considered in my medical decision making (see chart for details).     Medications  0.45 % sodium chloride infusion ( Intravenous New Bag/Given 12/12/16 1027)  HYDROmorphone (DILAUDID) injection 2 mg (2 mg Intravenous Not Given 12/12/16 1121)    Or  HYDROmorphone (DILAUDID) injection 2 mg ( Subcutaneous See Alternative 12/12/16 1121)  HYDROmorphone (DILAUDID) injection 2 mg (2 mg Intravenous Given 12/12/16 1342)    Or  HYDROmorphone (DILAUDID) injection 2 mg ( Subcutaneous See Alternative 12/12/16 1342)  ondansetron (ZOFRAN) injection 4 mg (4 mg Intravenous Given 12/12/16 1027)  ketorolac (TORADOL) 30 MG/ML injection 30 mg (30 mg Intravenous Given 12/12/16 1027)  HYDROmorphone (DILAUDID) injection 2 mg (2 mg Intravenous Given 12/12/16 1027)    Or  HYDROmorphone (DILAUDID) injection 2 mg ( Subcutaneous See Alternative 12/12/16 1027)  HYDROmorphone (DILAUDID) injection 2 mg (2 mg Intravenous Given 12/12/16 1136)    Or  HYDROmorphone (DILAUDID) injection 2 mg ( Subcutaneous See Alternative 12/12/16 1136)    Patient Vitals for the past 24 hrs:  BP Temp Temp src Pulse Resp SpO2 Height Weight  12/12/16 1300 (!) 157/94 - - 85 13 99 % - -  12/12/16 1245 - - - 85 15 98 % - -  12/12/16 1230 (!) 141/91 - - 82 16 99 % - -  12/12/16 1215 - - - 80 16 98 % - -  12/12/16 1200 (!)  143/93 - - 86 17 94 % - -  12/12/16 1130 125/73 - - 91 11 97 % - -  12/12/16 1100 (!) 160/83 - - 80 17 98 % - -  12/12/16 0930 138/89 - - 93 - 96 % - -  12/12/16 0904 (!) 152/77 98 F (36.7 C) Oral (!) 105 18 91 % 5\' 3"  (1.6 m) 163 lb (73.9 kg)    2:22 PM Reevaluation with update and discussion. After initial assessment and treatment, an updated evaluation reveals she is comfortable at this time, and has no further complaints.  Findings discussed with the patient and all questions were answered. Madelene Kaatz L    Final Clinical Impressions(s) / ED Diagnoses   Final diagnoses:  Sickle cell pain crisis ([place])   Recurrent pain crises, with sickle cell anemia.  Hemoglobin and reticulocyte counts are stable.  Doubt serious bacterial infection, metabolic instability or impending vascular collapse.  Nursing Notes Reviewed/ Care Coordinated Applicable Imaging Reviewed Interpretation of Laboratory Data incorporated into ED treatment  The patient appears reasonably screened and/or stabilized for discharge and I doubt any other medical condition or other [facility] requiring further screening, evaluation, or treatment in the ED at this time prior to discharge.  Plan: Home Medications-continue usual treatments; Home Treatments-rest; return here if the recommended treatment, does not improve the symptoms; Recommended follow up-PCP, tomorrow as scheduled   New Prescriptions New Prescriptions   No medications on file   I personally performed the services described in this documentation, which was scribed in my  presence. The recorded information has been reviewed and is accurate.    Daleen Bo, MD 12/12/16 1434

## 2016-12-12 NOTE — ED Triage Notes (Signed)
Sickle cell pain since last night. States her pain is all over.  Was going to clinic this morning ,  But clinic closed.

## 2016-12-12 NOTE — Discharge Instructions (Signed)
Follow-up at the clinic tomorrow as scheduled

## 2016-12-13 ENCOUNTER — Non-Acute Institutional Stay (HOSPITAL_COMMUNITY)
Admission: AD | Admit: 2016-12-13 | Discharge: 2016-12-13 | Disposition: A | Discharge status: Home / Self Care | Payer: Medicare Other | Source: Ambulatory Visit | Attending: Internal Medicine | Admitting: Internal Medicine

## 2016-12-13 ENCOUNTER — Ambulatory Visit (INDEPENDENT_AMBULATORY_CARE_PROVIDER_SITE_OTHER): Payer: Medicare Other | Admitting: Family Medicine

## 2016-12-13 ENCOUNTER — Encounter: Payer: Self-pay | Admitting: Family Medicine

## 2016-12-13 ENCOUNTER — Telehealth: Payer: Self-pay | Admitting: Hematology

## 2016-12-13 ENCOUNTER — Encounter (HOSPITAL_COMMUNITY): Payer: Self-pay | Admitting: *Deleted

## 2016-12-13 VITALS — BP 150/81 | HR 100 | Temp 98.7°F | Resp 16 | Ht 63.5 in | Wt 168.0 lb

## 2016-12-13 DIAGNOSIS — E559 Vitamin D deficiency, unspecified: Secondary | ICD-10-CM | POA: Diagnosis not present

## 2016-12-13 DIAGNOSIS — I878 Other specified disorders of veins: Secondary | ICD-10-CM | POA: Diagnosis not present

## 2016-12-13 DIAGNOSIS — D5700 Hb-SS disease with crisis, unspecified: Secondary | ICD-10-CM | POA: Diagnosis not present

## 2016-12-13 DIAGNOSIS — R110 Nausea: Secondary | ICD-10-CM

## 2016-12-13 DIAGNOSIS — R52 Pain, unspecified: Secondary | ICD-10-CM | POA: Diagnosis present

## 2016-12-13 DIAGNOSIS — Z79891 Long term (current) use of opiate analgesic: Secondary | ICD-10-CM | POA: Insufficient documentation

## 2016-12-13 DIAGNOSIS — Z79899 Other long term (current) drug therapy: Secondary | ICD-10-CM | POA: Insufficient documentation

## 2016-12-13 DIAGNOSIS — Z7982 Long term (current) use of aspirin: Secondary | ICD-10-CM | POA: Diagnosis not present

## 2016-12-13 MED ORDER — DIPHENHYDRAMINE HCL 25 MG PO CAPS
25.0000 mg | ORAL_CAPSULE | ORAL | Status: DC | PRN
  Administered 2016-12-13: 50 mg via ORAL
  Filled 2016-12-13: qty 2

## 2016-12-13 MED ORDER — GABAPENTIN 100 MG PO CAPS: 100.0000 mg | ORAL_CAPSULE | Freq: Three times a day (TID) | ORAL | 3 refills | Status: AC

## 2016-12-13 MED ORDER — OXYCODONE HCL 10 MG PO TABS: 10.0000 mg | ORAL_TABLET | ORAL | 0 refills | Status: AC | PRN

## 2016-12-13 MED ORDER — SODIUM CHLORIDE 0.9% FLUSH: 9.0000 mL | INTRAVENOUS | Status: DC | PRN

## 2016-12-13 MED ORDER — DEXTROSE-NACL 5-0.45 % IV SOLN
INTRAVENOUS | Status: DC
  Administered 2016-12-13: 11:00:00 via INTRAVENOUS

## 2016-12-13 MED ORDER — MORPHINE SULFATE ER 15 MG PO TBCR: 15.0000 mg | EXTENDED_RELEASE_TABLET | Freq: Two times a day (BID) | ORAL | 0 refills | Status: AC

## 2016-12-13 MED ORDER — PROMETHAZINE HCL 12.5 MG PO TABS: 12.5000 mg | ORAL_TABLET | Freq: Four times a day (QID) | ORAL | 3 refills | Status: AC | PRN

## 2016-12-13 MED ORDER — GABAPENTIN 300 MG PO CAPS: 300.0000 mg | ORAL_CAPSULE | Freq: Three times a day (TID) | ORAL | 0 refills | Status: DC

## 2016-12-13 MED ORDER — ONDANSETRON HCL 4 MG/2ML IJ SOLN: 4.0000 mg | Freq: Four times a day (QID) | INTRAMUSCULAR | Status: DC | PRN

## 2016-12-13 MED ORDER — L-GLUTAMINE ORAL POWDER: 5.0000 g | PACK | Freq: Two times a day (BID) | ORAL | 5 refills | Status: AC

## 2016-12-13 MED ORDER — SODIUM CHLORIDE 0.9 % IV SOLN
25.0000 mg | INTRAVENOUS | Status: DC | PRN
  Filled 2016-12-13: qty 0.5

## 2016-12-13 MED ORDER — NALOXONE HCL 0.4 MG/ML IJ SOLN: 0.4000 mg | INTRAMUSCULAR | Status: DC | PRN

## 2016-12-13 MED ORDER — PROMETHAZINE HCL 25 MG/ML IJ SOLN
12.5000 mg | Freq: Once | INTRAMUSCULAR | Status: AC
  Administered 2016-12-13: 12.5 mg via INTRAVENOUS
  Filled 2016-12-13: qty 1

## 2016-12-13 MED ORDER — HYDROMORPHONE 1 MG/ML IV SOLN
INTRAVENOUS | Status: DC
  Administered 2016-12-13: 10.1 mg via INTRAVENOUS
  Administered 2016-12-13: 11:00:00 via INTRAVENOUS
  Filled 2016-12-13: qty 25

## 2016-12-13 NOTE — Progress Notes (Signed)
Subjective:    Patient ID: Dawn [name], female    DOB: [birth year], 36 y.o.   MRN: [id number] Dawn [name] is a 36 year old female with a history of sickle cell anemia, HbSS presents for a follow-up. She states that pain has not been controlled. Patient was evaluated in the emergency department on 12/12/2016. Current pain intensity is 8/10 primarily to lower extremities.    She maintains that she has been taking all medications consistently.  She states that pain is not controlled on MS Contin 15 mg every 12 hours . Dawn [name] states that she only uses Oxycodone 10 mg for breakthrough pain.  She denies fever, chest pains, dysuria,  nausea, vomiting, or diarrhea.  Sickle Cell Pain Crisis  This is a chronic problem. The current episode started 1 to 4 weeks ago. The problem occurs constantly. Associated symptoms include joint swelling, myalgias (primarily to lower extremities), nausea and weakness. Pertinent negatives include no fatigue or fever. The symptoms are aggravated by exertion, standing and walking. She has tried oral narcotics, rest and drinking for the symptoms. The treatment provided mild relief.    Past Medical History:  Diagnosis Date  . Blood transfusion   . Demand ischemia ([place]) 02/06/2016  . HCAP (healthcare-associated pneumonia) 05/19/2013  . 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 Pulmonary hypertension 02/06/2016   49 mmHg per echo on 02/06/16   . 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 Sickle cell anemia (HCC)   . Sickle cell disease ([place])   . 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 Vitamin B12 deficiency 07/07/2015   Immunization History  Administered Date(s) Administered  . Influenza Split 07/07/2012  . Influenza,inj,Quad PF,36+ Mos 07/26/2013, 06/13/2014, 10/04/2016  . Pneumococcal Conjugate-13 12/16/2014  . Pneumococcal Polysaccharide-23 06/26/2010, 07/07/2012, 05/03/2016  . Tdap 08/24/2013   Social History   Social History  . Marital status: Divorced    Spouse name:  N/A  . Number of children: N/A  . Years of education: N/A   Occupational History  . Not on file.   Social History Main Topics  . Smoking status: Never Smoker  . Smokeless tobacco: Never Used  . Alcohol use No  . Drug use: No  . Sexual activity: Yes    Birth control/ protection: IUD   Other Topics Concern  . Not on file   Social History Narrative  . No narrative on file    Review of Systems  Constitutional: Negative for fatigue, fever and unexpected weight change.  HENT: Negative.   Eyes: Negative.  Negative for visual disturbance.  Respiratory: Negative.  Negative for shortness of breath.   Cardiovascular: Negative.   Gastrointestinal: Positive for nausea.  Endocrine: Negative.  Negative for polydipsia, polyphagia and polyuria.  Genitourinary: Negative.  Negative for difficulty urinating.  Musculoskeletal: Positive for back pain, joint swelling and myalgias (primarily to lower extremities).  Skin: Negative.   Allergic/Immunologic: Positive for environmental allergies. Negative for immunocompromised state.  Neurological: Positive for weakness.  Hematological: Negative.   Psychiatric/Behavioral: Negative.        Objective:   Physical Exam  Constitutional: She is oriented to person, place, and time. She appears well-developed and well-nourished.  HENT:  Head: Normocephalic and atraumatic.  Right Ear: External ear normal.  Left Ear: External ear normal.  Mouth/Throat: Oropharynx is clear and moist.  Eyes: Conjunctivae and EOM are normal. Pupils are equal, round, and reactive to light.  Neck: Normal range of motion. Neck supple.  Cardiovascular: Normal rate, normal heart  sounds and intact distal pulses.   Pulmonary/Chest: Effort normal and breath sounds normal.  Abdominal: Soft. Bowel sounds are normal.  Musculoskeletal:       Right knee: She exhibits decreased range of motion. She exhibits no swelling.       Left knee: She exhibits decreased range of motion. She  exhibits no swelling.  Neurological: She is alert and oriented to person, place, and time. She has normal reflexes.  Skin: Skin is warm and dry.  Psychiatric: She has a normal mood and affect. Her behavior is normal. Judgment and thought content normal.      BP (!) 150/81 (BP Location: Left Arm, Patient Position: Sitting, Cuff Size: Normal)   Pulse 100   Temp 98.7 F (37.1 C) (Oral)   Resp 16   Ht 5' 3.5" (1.613 m)   Wt 168 lb (76.2 kg)   SpO2 97%   BMI 29.29 kg/m   Assessment & Plan:   1. Hb-SS disease with crisis [facility]) Patient will transition to the day infusion center for a sickle cell crisis. Will administer bolus of IV fluids and weight based high concentration PCA dilaudid.  Will start a trial of Endari. Discussed medication and potential side effects at length. The findings showed a 25% reduction in the frequency of sickle-cell crises, a 33% decrease in hospitalisation rates, fewer hospital visits due to sickle-cell pain and 60% fewer occurrences of acute chest syndrome in patients administered with Endari.   Continue Hydrea 1000 mg daily (15 mg/kg), will not consider increasing dosage at this time. Will review ANC, platelets, and hemoglobin, and reticulocytes as results become available. We discussed the need for good hydration, monitoring of hydration status, avoidance of heat, cold, stress, and infection triggers. We discussed the risks and benefits of Hydrea, including bone marrow suppression, the possibility of GI upset, skin ulcers, hair thinning, and teratogenicity. The patient was reminded of the need to seek medical attention of any symptoms of bleeding, anemia, or infection. Continue folic acid 1 mg daily to prevent aplastic bone marrow crises. Discussed frequency of sickle cell crises at length. Reviewed urinalysis, mild proteinuria, improved from previous urinalysis.  We discussed in detail that hydroxyurea has been found to be effective in reducing VOC, transfusions, and  admissions for pain. It also reduces the duration of pain episodes by at least 50%. It depends on compliance. Compliance can be assessed by a rise in [facility] and drop in WBC/retic/ANC, and platelet count. It typically causes a rise in hemoglobin F, which can be assessed via hemoglobinopathy. It takes 3-6 months to see optimal effects of hydroxyurea therapy.   Pulmonary evaluation - Patient denies severe recurrent wheezes, shortness of breath with exercise, or persistent cough. If these symptoms develop, pulmonary function tests with spirometry will be ordered, and if abnormal, plan on referral to Pulmonology for further evaluation.  Cardiac - Routine screening for pulmonary hypertension is not recommended.  Eye - High risk of proliferative retinopathy. Annual eye exam with retinal exam recommended to patient. Last eye examination in April   Immunization status - Dawn [name] is up to date with immunizations  Acute and chronic painful episodes - We agreed on starting MS Contin 15 mg every 12 hours for increased pain. Continue Oxycodone 10 mg every 6 hours as needed for moderate to severe pain. Will also start a trial of Gabapentin for nerve pain. We discussed that pt is to receive her Schedule II prescriptions only from [place]. Pt is also aware that the prescription history  is available to [place] online through the [place]. Controlled substance agreement signed previously. We reminded Dawn [name] that all patients receiving Schedule II narcotics must be seen for follow within one month of prescription being requested. We reviewed the terms of our pain agreement, including the need to keep medicines in a safe locked location away from children or pets, and the need to report excess sedation or constipation, measures to avoid constipation, and policies related to early refills and stolen prescriptions. According to the [place] [facility] program, we have reviewed details related to analgesia, adverse effects, aberrant behaviors.  Reviewed [place] Substance Reporting system prior to prescribing opiate medications, no inconsistencies noted. Reviewed [place] Substance Reporting system prior prescribing opiate medications, no inconsistencies noted.  - L-glutamine (ENDARI) 5 g PACK Powder Packet; Take 5 g by mouth 2 (two) times daily.  Dispense: 60 packet; Refill: 5 - gabapentin (NEURONTIN) 100 MG capsule; Take 1 capsule (100 mg total) by mouth 3 (three) times daily.  Dispense: 90 capsule; Refill: 3 - Oxycodone HCl 10 MG TABS; Take 1 tablet (10 mg total) by mouth every 4 (four) hours as needed.  Dispense: 90 tablet; Refill: 0 - morphine (MS CONTIN) 15 MG 12 hr tablet; Take 1 tablet (15 mg total) by mouth every 12 (twelve) hours.  Dispense: 60 tablet; Refill: 0  2. Vitamin D deficiency Will continue vitamin D at current dosage.   3. Poor venous access Patient recently had right chest port a cath removed due to infection. Patient has poor venous access and will need to have port replaced.  - Ambulatory referral to Interventional Radiology  4. Nausea in adult - promethazine (PHENERGAN) 12.5 MG tablet; Take 1 tablet (12.5 mg total) by mouth every 6 (six) hours as needed for nausea or vomiting.  Dispense: 30 tablet; Refill: 3   RTC: 1 week for medication management   Donia Pounds MSN, FNP-C [facility] [place], [place] [id number] [place], FNP

## 2016-12-13 NOTE — Progress Notes (Signed)
Patient admitted to the [facility] c/o generalized pain and rated it 8/10 on pain scale upon admission. Patient was placed on Dilaudid PCA and IVF. At time of discharge patient reports a pain level of 4/10. Discharge instructions given to patient and patient states an understanding. Patient alert, oriented, and ambulatory at time of discharge. Patient sister is outside to drive patient home.

## 2016-12-13 NOTE — Discharge Instructions (Signed)
Sickle Cell Anemia, Adult °Sickle cell anemia is a condition where your red blood cells are shaped like sickles. Red blood cells carry oxygen through the body. Sickle-shaped red blood cells do not live as long as normal red blood cells. They also clump together and block blood from flowing through the blood vessels. These things prevent the body from getting enough oxygen. Sickle cell anemia causes organ damage and pain. It also increases the risk of infection. °Follow these instructions at home: °· Drink enough fluid to keep your pee (urine) clear or pale yellow. Drink more in hot weather and during exercise. °· Do not smoke. Smoking lowers oxygen levels in the blood. °· Only take over-the-counter or prescription medicines as told by your doctor. °· Take antibiotic medicines as told by your doctor. Make sure you finish them even if you start to feel better. °· Take supplements as told by your doctor. °· Consider wearing a medical alert bracelet. This tells anyone caring for you in an emergency of your condition. °· When traveling, keep your medical information, doctors' names, and the medicines you take with you at all times. °· If you have a fever, do not take fever medicines right away. This could cover up a problem. Tell your doctor. °· Keep all follow-up visits with your doctor. Sickle cell anemia requires regular medical care. °Contact a doctor if: °You have a fever. °Get help right away if: °· You feel dizzy or faint. °· You have new belly (abdominal) pain, especially on the left side near the stomach area. °· You have a lasting, often uncomfortable and painful erection of the penis (priapism). If it is not treated right away, you will become unable to have sex (impotence). °· You have numbness in your arms or legs or you have a hard time moving them. °· You have a hard time talking. °· You have a fever or lasting symptoms for more than 2-3 days. °· You have a fever and your symptoms suddenly get  worse. °· You have signs or symptoms of infection. These include: °? Chills. °? Being more tired than normal (lethargy). °? Irritability. °? Poor eating. °? Throwing up (vomiting). °· You have pain that is not helped with medicine. °· You have shortness of breath. °· You have pain in your chest. °· You are coughing up pus-like or bloody mucus. °· You have a stiff neck. °· Your feet or hands swell or have pain. °· Your belly looks bloated. °· Your joints hurt. °This information is not intended to replace advice given to you by your health care provider. Make sure you discuss any questions you have with your health care provider. °Document Released: 07/01/2013 Document Revised: 02/16/2016 Document Reviewed: 04/22/2013 °Elsevier Interactive Patient Education © 2017 [employer] ° °

## 2016-12-13 NOTE — Discharge Summary (Signed)
[facility] Discharge Summary   Patient ID: Dawn [name] MRN: [id number] DOB/AGE: 11/25/1980 36 y.o.  Admit date: 12/13/2016 Discharge date: 12/13/2016  Primary Care Physician:  [name], FNP  Admission Diagnoses:  Active Problems:   Sickle cell anemia with pain [facility])   Discharge Medications:  Allergies as of 12/13/2016      Reactions   Desferal [deferoxamine] Hives   Local reaction on arm only during infusion. Can take with benadryl    Latex Other (See Comments)   REACTION: Pt experiences a burning sensation on contacted skin areas   Lisinopril Other (See Comments), Cough   REACTION: Sore/scratchy throat   Tape Other (See Comments)   REACTION: Pt. Experiences a burning sensation on contacted skin areas   Tylenol [acetaminophen] Other (See Comments)   Pt. Does not take the following due to protection of kidney health. Past occurrence of Protein in urine.      Medication List    TAKE these medications   aspirin 81 MG EC tablet Take 1 tablet (81 mg total) by mouth daily.   folic acid 1 MG tablet Commonly known as:  FOLVITE Take 1 tablet (1 mg total) by mouth daily.   gabapentin 100 MG capsule Commonly known as:  NEURONTIN Take 1 capsule (100 mg total) by mouth 3 (three) times daily.   hydroxyurea 500 MG capsule Commonly known as:  HYDREA Take 2 capsules (1,000 mg total) by mouth daily. May take with food to minimize GI side effects.   L-glutamine 5 g Pack Powder Packet Commonly known as:  ENDARI Take 5 g by mouth 2 (two) times daily.   levonorgestrel 20 MCG/24HR IUD Commonly known as:  MIRENA 1 each by Intrauterine route once.   loratadine 10 MG tablet Commonly known as:  CLARITIN Take 10 mg by mouth daily.   morphine 15 MG 12 hr tablet Commonly known as:  MS CONTIN Take 1 tablet (15 mg total) by mouth every 12 (twelve) hours.   multivitamin with minerals Tabs tablet Take 1 tablet by mouth daily.   Oxycodone HCl 10 MG Tabs Take 1  tablet (10 mg total) by mouth every 4 (four) hours as needed.   promethazine 12.5 MG tablet Commonly known as:  PHENERGAN Take 1 tablet (12.5 mg total) by mouth every 6 (six) hours as needed for nausea or vomiting.   Vitamin D (Ergocalciferol) 50000 units Caps capsule Commonly known as:  DRISDOL Take 1 capsule (50,000 Units total) by mouth every 7 (seven) days. Takes on Sundays.        Consults:  None  Significant Diagnostic Studies:  Dg Chest 2 View  Result Date: 12/09/2016 CLINICAL DATA:  Chest pain for several weeks, worsening with sickle cell crisis. EXAM: CHEST  2 VIEW COMPARISON:  11/11/2016. FINDINGS: Borderline enlarged cardiac silhouette with an interval decrease in size. Mildly hyperexpanded lungs with mild diffuse peribronchial thickening and accentuation of the interstitial markings, without significant change. The right jugular porta catheter has been removed. No acute bony abnormality. IMPRESSION: No acute abnormality. Stable mild changes of COPD and chronic bronchitis. Electronically Signed   By: Claudie Revering M.D.   On: 12/09/2016 11:49     [facility] Course: Pt was treated with IVF, Toradol and IV analgesics. Patient started on high concentration PCA dilaudid. She had a total use of 10.1 mg with 15 demands and 13 deliveries.  Pain has decreased from 10/10 to 5/10. Patient is alert, oriented, and ambulating. Patient will follow  up in clinic in 1 week for medication management.  Pt will be discharged home on current medication regimen.    The patient was given clear instructions to go to ER or return to medical center if symptoms do not improve, worsen or new problems develop. The patient verbalized understanding.  Physical Exam at Discharge:   BP (!) 153/97 (BP Location: Left Arm)   Pulse 91   Temp 98.7 F (37.1 C) (Oral)   Resp 16   Ht 5\' 3"  (1.6 m)   Wt 168 lb (76.2 kg)   SpO2 95%   BMI 29.76 kg/m   General Appearance:    Alert, cooperative,  no distress, appears stated age  Head:    Normocephalic, without obvious abnormality, atraumatic  Eyes:    PERRL, conjunctiva/corneas clear, EOM's intact, fundi    benign, Bilateral icterus  Back:     Symmetric, no curvature, ROM normal, no CVA tenderness  Lungs:     Clear to auscultation bilaterally, respirations unlabored  Chest Wall:    No tenderness or deformity   Heart:    Regular rate and rhythm, S1 and S2 normal, no murmur, rub   or gallop  Extremities:   Extremities normal, atraumatic, no cyanosis or edema  Pulses:   2+ and symmetric all extremities  Skin:   Skin color, texture, turgor normal, no rashes or lesions  Lymph nodes:   Cervical, supraclavicular, and axillary nodes normal  Neurologic:   CNII-XII intact, normal strength, sensation and reflexes    throughout    Disposition at Discharge: 01-Home or Self Care  Discharge Orders: Discharge Instructions    Discharge patient    Complete by:  As directed    Discharge disposition:  01-Home or Self Care   Discharge patient date:  12/13/2016      Condition at Discharge:   Stable  Time spent on Discharge:  15 minutes  Signed: Hollis,Lachina M 12/13/2016, 3:41 PM

## 2016-12-13 NOTE — Patient Instructions (Addendum)
Will start a trial of Endari. Patient to mix a packet to water in am and pm. Common reactions to medication can be constipation, nausea, headache, abdominal pain, or hotflashes.  The findings showed a 25% reduction in the frequency of sickle-cell crises, a 33% decrease in hospitalisation rates, fewer hospital visits due to sickle-cell pain and 60% fewer occurrences of acute chest syndrome in patients administered with Endari.   Gabapentin 100 mg three times per day. Do not drink alcohol, drive, or operate machinery on today.

## 2016-12-13 NOTE — Telephone Encounter (Signed)
Patient C/O pain to arms, legs and back.  Patient rates pain 8/10 on pain scale.  Patient denies chest pain or shortness of breath.  Patient denies fever, N/V/D.  Patient states she has taken her pain medication without improvement.  Patient is [facility].  Placed caller on hold and discussed with provider / Provider will see patient in clinic to evaluate the need for day hospital. / Patient verbalizes understanding.

## 2016-12-13 NOTE — H&P (Signed)
[facility] History and Physical   Date: 12/13/2016  Patient name: Dawn [name] Medical record number: [id number] Date of birth: 08/14/1981 Age: 36 y.o. Gender: female PCP: [name], FNP  Attending physician: Tresa Garter, MD  Chief Complaint: Generalized pain  History of Present Illness: Dawn [name], a 35 year old female with a history of sickle cell anemia, HbSS presents complaining of pain to lower extremities. Dawn [name] has had frequent emergency room and inpatient visits over the past several months. She was last evaluated in the emergency department on 12/12/2016 for a pain crisis. She transitioned to the day infusion center from primary care. She attributes current pain crisis to changes in weather. Current pain intensity is 8/10 described as constant and sharp. She last had Oxycodone this am without sustained relief. She denies headache, shortness of breath, paresthesias, dysuria,  nausea, vomiting, or diarrhea.   Meds: Prescriptions Prior to Admission  Medication Sig Dispense Refill Last Dose  . aspirin EC 81 MG EC tablet Take 1 tablet (81 mg total) by mouth daily. 30 tablet 0 12/13/2016 at Unknown time  . folic acid (FOLVITE) 1 MG tablet Take 1 tablet (1 mg total) by mouth daily. 30 tablet 11 12/13/2016 at Unknown time  . gabapentin (NEURONTIN) 100 MG capsule Take 1 capsule (100 mg total) by mouth 3 (three) times daily. 90 capsule 3 12/13/2016 at Unknown time  . hydroxyurea (HYDREA) 500 MG capsule Take 2 capsules (1,000 mg total) by mouth daily. May take with food to minimize GI side effects. 60 capsule 11 12/13/2016 at Unknown time  . L-glutamine (ENDARI) 5 g PACK Powder Packet Take 5 g by mouth 2 (two) times daily. 60 packet 5 12/13/2016 at Unknown time  . levonorgestrel (MIRENA) 20 MCG/24HR IUD 1 each by Intrauterine route once.   12/13/2016 at Unknown time  . loratadine (CLARITIN) 10 MG tablet Take 10 mg by mouth daily.   12/13/2016 at Unknown time  . morphine (MS  CONTIN) 15 MG 12 hr tablet Take 1 tablet (15 mg total) by mouth every 12 (twelve) hours. 60 tablet 0 12/13/2016 at Unknown time  . Multiple Vitamin (MULTIVITAMIN WITH MINERALS) TABS tablet Take 1 tablet by mouth daily.   12/13/2016 at Unknown time  . Oxycodone HCl 10 MG TABS Take 1 tablet (10 mg total) by mouth every 4 (four) hours as needed. 90 tablet 0 12/13/2016 at Unknown time  . promethazine (PHENERGAN) 12.5 MG tablet Take 1 tablet (12.5 mg total) by mouth every 6 (six) hours as needed for nausea or vomiting. 30 tablet 0 12/12/2016 at Unknown time  . Vitamin D, Ergocalciferol, (DRISDOL) 50000 units CAPS capsule Take 1 capsule (50,000 Units total) by mouth every 7 (seven) days. Takes on Sundays. 4 capsule 11 12/12/2016 at Unknown time    Allergies: Desferal [deferoxamine]; Latex; Lisinopril; Tape; and Tylenol [acetaminophen] Past Medical History:  Diagnosis Date  . Blood transfusion   . Demand ischemia ([place]) 02/06/2016  . HCAP (healthcare-associated pneumonia) 05/19/2013  . 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 Pulmonary hypertension 02/06/2016   49 mmHg per echo on 02/06/16   . 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 Sickle cell anemia (HCC)   . Sickle cell disease ([place])   . Sickle cell disease, type S ([place])   . Vitamin B12 deficiency 07/07/2015   Past Surgical History:  Procedure Laterality Date  .  left knee ACL reconstruction    . CESAREAN SECTION     x 2  . CHOLECYSTECTOMY    .  IR GENERIC HISTORICAL  07/17/2016   IR FLUORO GUIDE PORT INSERTION RIGHT 07/17/2016 Aletta Edouard, MD [facility]  . IR GENERIC HISTORICAL  07/17/2016   IR US GUIDE VASC ACCESS RIGHT 07/17/2016 Aletta Edouard, MD [facility]  . IR GENERIC HISTORICAL  10/12/2016   IR PTA VENOUS EXCEPT DIALYSIS CIRCUIT 10/12/2016 Arne Cleveland, MD [facility]  . IR GENERIC HISTORICAL  10/12/2016   IR US GUIDE VASC ACCESS RIGHT 10/12/2016 Arne Cleveland, MD [facility]  . IR GENERIC HISTORICAL  10/12/2016   IR  VENO/EXT/UNI RIGHT 10/12/2016 Arne Cleveland, MD [facility]  . PORT-A-CATH REMOVAL Left 07/29/2013   Procedure: REMOVAL PORT-A-CATH;  Surgeon: Scherry Ran, MD;  Location: [facility];  Service: General;  Laterality: Left;  . PORT-A-CATH REMOVAL Right 11/14/2016   Procedure: REMOVAL PORT-A-CATH;  Surgeon: Aviva Signs, MD;  Location: [facility];  Service: General;  Laterality: Right;  . PORTACATH PLACEMENT    . PORTACATH PLACEMENT Left 02/23/2013   Procedure: INSERTION PORT-A-CATH;  Surgeon: Donato Heinz, MD;  Location: [facility];  Service: General;  Laterality: Left;  . TEE WITHOUT CARDIOVERSION N/A 08/07/2013   Procedure: TRANSESOPHAGEAL ECHOCARDIOGRAM (TEE);  Surgeon: Arnoldo Lenis, MD;  Location: [facility];  Service: Cardiology;  Laterality: N/A;   Family History  Problem Relation Age of Onset  . Sickle cell trait Mother   . Sickle cell trait Father   . Hypertension Father   . Diabetes Father   . Sickle cell trait Sister   . Cancer Paternal Aunt     Breast   Social History   Social History  . Marital status: Divorced    Spouse name: N/A  . Number of children: N/A  . Years of education: N/A   Occupational History  . Not on file.   Social History Main Topics  . Smoking status: Never Smoker  . Smokeless tobacco: Never Used  . Alcohol use No  . Drug use: No  . Sexual activity: Yes    Birth control/ protection: IUD   Other Topics Concern  . Not on file   Social History Narrative  . No narrative on file    Review of Systems: Constitutional: negative for fatigue Eyes: positive for icterus Ears, nose, mouth, throat, and face: negative Respiratory: negative for asthma, cough, dyspnea on exertion and wheezing Cardiovascular: negative for chest pain, dyspnea, fatigue and irregular heart beat Gastrointestinal: negative for abdominal pain, constipation and nausea Genitourinary:negative for dysuria Hematologic/lymphatic: negative Musculoskeletal:positive for  arthralgias and myalgias Neurological: negative for dizziness, gait problems, headaches and paresthesia Behavioral/Psych: negative Endocrine: negative  Physical Exam: Blood pressure 139/76, pulse 88, temperature 98.7 F (37.1 C), temperature source Oral, resp. rate 18, height 5\' 3"  (1.6 m), weight 168 lb (76.2 kg), SpO2 96 %. BP 139/76 (BP Location: Right Arm, Patient Position: Sitting)   Pulse 88   Temp 98.7 F (37.1 C) (Oral)   Resp 18   Ht 5\' 3"  (1.6 m)   Wt 168 lb (76.2 kg)   SpO2 96%   BMI 29.76 kg/m   General Appearance:    Alert, cooperative, no distress, appears stated age  Head:    Normocephalic, without obvious abnormality, atraumatic  Eyes:    PERRL, conjunctiva/corneas clear, EOM's intact, fundi    benign, Bilateral icterus  Ears:    Normal TM's and external ear canals, both ears  Nose:   Nares normal, septum midline, mucosa normal, no drainage    or sinus tenderness  Throat:   Lips,  mucosa, and tongue normal; teeth and gums normal  Neck:   Supple, symmetrical, trachea midline, no adenopathy;    thyroid:  no enlargement/tenderness/nodules; no carotid   bruit or JVD  Back:     Symmetric, no curvature, ROM normal, no CVA tenderness  Lungs:     Clear to auscultation bilaterally, respirations unlabored  Chest Wall:    No tenderness or deformity   Heart:    Regular rate and rhythm, S1 and S2 normal, no murmur, rub   or gallop  Abdomen:     Soft, non-tender, bowel sounds active all four quadrants,    no masses, no organomegaly  Extremities:   Extremities normal, atraumatic, no cyanosis or edema  Pulses:   2+ and symmetric all extremities  Skin:   Skin color, texture, turgor normal, no rashes or lesions  Lymph nodes:   Cervical, supraclavicular, and axillary nodes normal  Neurologic:   CNII-XII intact, normal strength, sensation and reflexes    throughout    Lab results: No results found for this or any previous visit (from the past 24 hour(s)).  Imaging results:   No results found.   Assessment & Plan:  Patient will be admitted to the day infusion center for extended observation  Start IV D5.45 for cellular rehydration at 100/hr  Start Dilaudid PCA High Concentration per weight based protocol.   Patient will be re-evaluated for pain intensity in the context of function and relationship to baseline as care progresses.  If no significant pain relief, will transfer patient to inpatient services for a higher level of care.   Reviewed labs from emergency department form 3/21, all labs consistent with baseline.    Joyanna Kleman M 12/13/2016, 10:29 AM

## 2016-12-15 ENCOUNTER — Emergency Department (HOSPITAL_COMMUNITY)
Admission: EM | Admit: 2016-12-15 | Discharge: 2016-12-15 | Disposition: A | Discharge status: Home / Self Care | Payer: Medicare Other | Attending: Emergency Medicine | Admitting: Emergency Medicine

## 2016-12-15 ENCOUNTER — Encounter (HOSPITAL_COMMUNITY): Payer: Self-pay

## 2016-12-15 DIAGNOSIS — Z79899 Other long term (current) drug therapy: Secondary | ICD-10-CM | POA: Insufficient documentation

## 2016-12-15 DIAGNOSIS — D5700 Hb-SS disease with crisis, unspecified: Secondary | ICD-10-CM | POA: Diagnosis not present

## 2016-12-15 DIAGNOSIS — M791 Myalgia: Secondary | ICD-10-CM | POA: Diagnosis present

## 2016-12-15 DIAGNOSIS — Z7982 Long term (current) use of aspirin: Secondary | ICD-10-CM | POA: Insufficient documentation

## 2016-12-15 LAB — COMPREHENSIVE METABOLIC PANEL
ALK PHOS: 127 U/L — AB (ref 38–126)
ALT: 36 U/L (ref 14–54)
ANION GAP: 5 (ref 5–15)
AST: 127 U/L — ABNORMAL HIGH (ref 15–41)
Albumin: 2.4 g/dL — ABNORMAL LOW (ref 3.5–5.0)
BILIRUBIN TOTAL: 10.7 mg/dL — AB (ref 0.3–1.2)
BUN: 5 mg/dL — ABNORMAL LOW (ref 6–20)
CALCIUM: 8.5 mg/dL — AB (ref 8.9–10.3)
CO2: 23 mmol/L (ref 22–32)
Chloride: 106 mmol/L (ref 101–111)
Glucose, Bld: 74 mg/dL (ref 65–99)
Potassium: 4.2 mmol/L (ref 3.5–5.1)
Sodium: 134 mmol/L — ABNORMAL LOW (ref 135–145)
TOTAL PROTEIN: 7 g/dL (ref 6.5–8.1)

## 2016-12-15 LAB — CBC WITH DIFFERENTIAL/PLATELET
BASOS ABS: 0 10*3/uL (ref 0.0–0.1)
Basophils Relative: 0 %
Eosinophils Absolute: 0.2 10*3/uL (ref 0.0–0.7)
Eosinophils Relative: 2 %
HCT: 22.6 % — ABNORMAL LOW (ref 36.0–46.0)
HEMOGLOBIN: 8.5 g/dL — AB (ref 12.0–15.0)
Lymphocytes Relative: 30 %
Lymphs Abs: 2.6 10*3/uL (ref 0.7–4.0)
MCH: 37.6 pg — AB (ref 26.0–34.0)
MCHC: 37.6 g/dL — AB (ref 30.0–36.0)
MCV: 100 fL (ref 78.0–100.0)
Monocytes Absolute: 1.1 10*3/uL — ABNORMAL HIGH (ref 0.1–1.0)
Monocytes Relative: 13 %
NEUTROS ABS: 4.9 10*3/uL (ref 1.7–7.7)
Neutrophils Relative %: 55 %
Platelets: 205 10*3/uL (ref 150–400)
RBC: 2.26 MIL/uL — ABNORMAL LOW (ref 3.87–5.11)
RDW: 24.4 % — ABNORMAL HIGH (ref 11.5–15.5)
Smear Review: ADEQUATE
WBC: 8.8 10*3/uL (ref 4.0–10.5)

## 2016-12-15 LAB — RETICULOCYTES
RBC.: 2.26 MIL/uL — AB (ref 3.87–5.11)
RETIC COUNT ABSOLUTE: 427.1 10*3/uL — AB (ref 19.0–186.0)
Retic Ct Pct: 18.9 % — ABNORMAL HIGH (ref 0.4–3.1)

## 2016-12-15 MED ORDER — HYDROMORPHONE HCL 2 MG/ML IJ SOLN
2.0000 mg | INTRAMUSCULAR | Status: DC
  Administered 2016-12-15: 2 mg via INTRAVENOUS
  Filled 2016-12-15: qty 1

## 2016-12-15 MED ORDER — KETOROLAC TROMETHAMINE 30 MG/ML IJ SOLN
30.0000 mg | INTRAMUSCULAR | Status: AC
  Administered 2016-12-15: 30 mg via INTRAVENOUS
  Filled 2016-12-15: qty 1

## 2016-12-15 MED ORDER — DIPHENHYDRAMINE HCL 25 MG PO CAPS: 25.0000 mg | ORAL_CAPSULE | ORAL | Status: DC | PRN

## 2016-12-15 MED ORDER — HYDROMORPHONE HCL 2 MG/ML IJ SOLN
2.0000 mg | INTRAMUSCULAR | Status: AC
  Administered 2016-12-15: 2 mg via INTRAVENOUS
  Filled 2016-12-15: qty 1

## 2016-12-15 MED ORDER — HYDROMORPHONE HCL 2 MG/ML IJ SOLN: 2.0000 mg | INTRAMUSCULAR | Status: AC

## 2016-12-15 MED ORDER — HYDROMORPHONE HCL 2 MG/ML IJ SOLN: 2.0000 mg | INTRAMUSCULAR | Status: DC

## 2016-12-15 MED ORDER — SODIUM CHLORIDE 0.45 % IV SOLN
INTRAVENOUS | Status: DC
  Administered 2016-12-15: 02:00:00 via INTRAVENOUS

## 2016-12-15 MED ORDER — ONDANSETRON HCL 4 MG/2ML IJ SOLN
4.0000 mg | INTRAMUSCULAR | Status: DC | PRN
  Administered 2016-12-15: 4 mg via INTRAVENOUS
  Filled 2016-12-15: qty 2

## 2016-12-15 NOTE — ED Provider Notes (Signed)
[facility] DEPT Provider Note   CSN: [id number] Arrival date & time: 12/15/16  0034     History   Chief Complaint Chief Complaint  Patient presents with  . Sickle Cell Pain Crisis    HPI Dawn [name] is a 36 y.o. female.  HPI  36 year old female presents with a sickle cell pain crisis. Started feeling increased pain over the last 2 days. Feels similar to multiple prior sickle cell pain crises. No fevers, cough, short of breath, or chest pain. Her pain is in her bilateral forearms, bilateral lower legs, and low back. This is all recurrent and typical for her. No weakness or numbness. No swelling. Has been taking her home MS Contin and oxycodone. Also is was just recently started on Neurontin and Endari by her sickle cell MD.  Past Medical History:  Diagnosis Date  . Blood transfusion   . Demand ischemia ([place]) 02/06/2016  . HCAP (healthcare-associated pneumonia) 05/19/2013  . 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 Pulmonary hypertension 02/06/2016   49 mmHg per echo on 02/06/16   . 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 Sickle cell anemia (HCC)   . Sickle cell disease ([place])   . 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 Vitamin B12 deficiency 07/07/2015    Patient Active Problem List   Diagnosis Date Noted  . Sickle cell anemia with pain ([place]) 11/06/2016  . Syncope 10/17/2016  . Dizziness   . Hypomagnesemia   . Swelling of right upper extremity   . 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 Sickle cell crisis ([place]) 10/03/2016  . Positive D dimer   . Pulmonary hypertension 02/16/2016  . 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 Anemia of chronic disease   . Chronic pain syndrome 10/14/2015  . 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 Vitamin B12 deficiency 07/07/2015  . Thrombocytopenia ([place]) 07/06/2015  . Hyperbilirubinemia 09/11/2014  . Macrocytosis 06/13/2014  . Vitamin D deficiency 11/23/2013  . 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 Hypokalemia 03/28/2012  . 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 Elevated LFTs 04/27/2011  . Sickle cell disease ([place]) 04/26/2011    Past Surgical History:  Procedure Laterality Date  .  left knee ACL reconstruction    . CESAREAN SECTION     x 2  . CHOLECYSTECTOMY    . IR GENERIC HISTORICAL  07/17/2016   IR FLUORO GUIDE PORT INSERTION RIGHT 07/17/2016 Aletta Edouard, MD [facility]  . IR GENERIC HISTORICAL  07/17/2016   IR US GUIDE VASC ACCESS RIGHT 07/17/2016 Aletta Edouard, MD [facility]  . IR GENERIC HISTORICAL  10/12/2016   IR PTA VENOUS EXCEPT DIALYSIS CIRCUIT 10/12/2016 Arne Cleveland, MD [facility]  . IR GENERIC HISTORICAL  10/12/2016   IR US GUIDE VASC ACCESS RIGHT 10/12/2016 Arne Cleveland, MD [facility]  . IR GENERIC HISTORICAL  10/12/2016   IR VENO/EXT/UNI RIGHT 10/12/2016 Arne Cleveland, MD [facility]  . PORT-A-CATH REMOVAL Left 07/29/2013   Procedure: REMOVAL PORT-A-CATH;  Surgeon: Scherry Ran, MD;  Location: [facility];  Service: General;  Laterality: Left;  . PORT-A-CATH REMOVAL Right 11/14/2016   Procedure: REMOVAL PORT-A-CATH;  Surgeon: Aviva Signs, MD;  Location: [facility];  Service: General;  Laterality: Right;  . PORTACATH PLACEMENT    . PORTACATH PLACEMENT Left 02/23/2013   Procedure: INSERTION PORT-A-CATH;  Surgeon: Donato Heinz, MD;  Location: [facility];  Service: General;  Laterality: Left;  . TEE WITHOUT CARDIOVERSION N/A 08/07/2013   Procedure:  TRANSESOPHAGEAL ECHOCARDIOGRAM (TEE);  Surgeon: Arnoldo Lenis, MD;  Location: [facility];  Service: Cardiology;  Laterality: N/A;    OB History    Gravida Para Term Preterm AB Living   1 1   1   1    SAB TAB Ectopic Multiple Live Births                   Home Medications    Prior to Admission medications   Medication Sig Start Date End Date Taking? Authorizing Provider  aspirin EC 81 MG EC tablet Take 1 tablet (81 mg total) by mouth daily. 10/18/16  Yes  Leana Gamer, MD  folic acid (FOLVITE) 1 MG tablet Take 1 tablet (1 mg total) by mouth daily. 03/14/16  Yes [name], FNP  gabapentin (NEURONTIN) 100 MG capsule Take 1 capsule (100 mg total) by mouth 3 (three) times daily. 12/13/16  Yes [name], FNP  hydroxyurea (HYDREA) 500 MG capsule Take 2 capsules (1,000 mg total) by mouth daily. May take with food to minimize GI side effects. 03/14/16  Yes [name], FNP  L-glutamine (ENDARI) 5 g PACK Powder Packet Take 5 g by mouth 2 (two) times daily. 12/13/16  Yes [name], FNP  levonorgestrel (MIRENA) 20 MCG/24HR IUD 1 each by Intrauterine route once.   Yes Historical Provider, MD  loratadine (CLARITIN) 10 MG tablet Take 10 mg by mouth daily.   Yes Historical Provider, MD  morphine (MS CONTIN) 15 MG 12 hr tablet Take 1 tablet (15 mg total) by mouth every 12 (twelve) hours. 12/13/16  Yes [name], FNP  Multiple Vitamin (MULTIVITAMIN WITH MINERALS) TABS tablet Take 1 tablet by mouth daily.   Yes Historical Provider, MD  Oxycodone HCl 10 MG TABS Take 1 tablet (10 mg total) by mouth every 4 (four) hours as needed. 12/13/16  Yes [name], FNP  promethazine (PHENERGAN) 12.5 MG tablet Take 1 tablet (12.5 mg total) by mouth every 6 (six) hours as needed for nausea or vomiting. 12/13/16  Yes [name], FNP  Vitamin D, Ergocalciferol, (DRISDOL) 50000 units CAPS capsule Take 1 capsule (50,000 Units total) by mouth every 7 (seven) days. Takes on Sundays. 06/01/16  Yes Micheline Chapman, NP    Family History Family History  Problem Relation Age of Onset  . Sickle cell trait Mother   . Sickle cell trait Father   . Hypertension Father   . Diabetes Father   . Sickle cell trait Sister   . Cancer Paternal Aunt     Breast    Social History Social History  Substance Use Topics  . Smoking status: Never Smoker  . Smokeless tobacco: Never Used  . Alcohol use No     Allergies   Desferal [deferoxamine]; Latex;  Lisinopril; Tape; and Tylenol [acetaminophen]   Review of Systems Review of Systems  Constitutional: Negative for fever.  Respiratory: Negative for cough and shortness of breath.   Cardiovascular: Negative for chest pain.  Gastrointestinal: Negative for abdominal pain.  Musculoskeletal: Positive for back pain and myalgias.  Neurological: Negative for weakness and numbness.  All other systems reviewed and are negative.    Physical Exam Updated Vital Signs BP (!) 145/94   Pulse 94   Temp 98.4 F (36.9 C) (Oral)   Resp 18   Ht 5\' 3"  (1.6 m)   Wt 167 lb 8.8 oz (76 kg)   SpO2 95%   BMI 29.68 kg/m  Physical Exam  Constitutional: She is oriented to person, place, and time. She appears well-developed and well-nourished.  HENT:  Head: Normocephalic and atraumatic.  Right Ear: External ear normal.  Left Ear: External ear normal.  Nose: Nose normal.  Eyes: Right eye exhibits no discharge. Left eye exhibits no discharge. Scleral icterus is present.  Cardiovascular: Normal rate, regular rhythm and normal heart sounds.   Pulses:      Radial pulses are 2+ on the right side, and 2+ on the left side.       Dorsalis pedis pulses are 2+ on the right side, and 2+ on the left side.  Pulmonary/Chest: Effort normal and breath sounds normal.  Abdominal: Soft. She exhibits no distension. There is no tenderness.  Musculoskeletal:       Right elbow: She exhibits normal range of motion and no swelling. No tenderness found.       Left elbow: She exhibits normal range of motion. No tenderness found.       Right knee: She exhibits normal range of motion. No tenderness found.       Left knee: She exhibits normal range of motion. No tenderness found.       Thoracic back: She exhibits no tenderness.       Lumbar back: She exhibits no tenderness.       Right forearm: She exhibits tenderness (mild). She exhibits no bony tenderness and no swelling.       Left forearm: She exhibits tenderness (mild).  She exhibits no bony tenderness and no swelling.       Right lower leg: She exhibits tenderness (mild). She exhibits no bony tenderness and no swelling.       Left lower leg: She exhibits tenderness (mild). She exhibits no bony tenderness and no swelling.  Neurological: She is alert and oriented to person, place, and time.  5/5 strength in all 4 extremities. Grossly normal sensation  Skin: Skin is warm and dry.  Nursing note and vitals reviewed.    ED Treatments / Results  Labs (all labs ordered are listed, but only abnormal results are displayed) Labs Reviewed  CBC WITH DIFFERENTIAL/PLATELET - Abnormal; Notable for the following:       Result Value   RBC 2.26 (*)    Hemoglobin 8.5 (*)    HCT 22.6 (*)    MCH 37.6 (*)    MCHC 37.6 (*)    RDW 24.4 (*)    Monocytes Absolute 1.1 (*)    All other components within normal limits  COMPREHENSIVE METABOLIC PANEL - Abnormal; Notable for the following:    Sodium 134 (*)    BUN 5 (*)    Creatinine, Ser <0.30 (*)    Calcium 8.5 (*)    Albumin 2.4 (*)    AST 127 (*)    Alkaline Phosphatase 127 (*)    Total Bilirubin 10.7 (*)    All other components within normal limits  RETICULOCYTES - Abnormal; Notable for the following:    Retic Ct Pct 18.9 (*)    RBC. 2.26 (*)    Retic Count, Manual 427.1 (*)    All other components within normal limits    EKG  EKG Interpretation None       Radiology No results found.  Procedures Procedures (including critical care time)  Medications Ordered in ED Medications  0.45 % sodium chloride infusion ( Intravenous Stopped 12/15/16 0406)  HYDROmorphone (DILAUDID) injection 2 mg (2 mg Intravenous Given 12/15/16 0400)  Or  HYDROmorphone (DILAUDID) injection 2 mg ( Subcutaneous See Alternative 12/15/16 0400)  diphenhydrAMINE (BENADRYL) capsule 25-50 mg (not administered)  ondansetron (ZOFRAN) injection 4 mg (4 mg Intravenous Given 12/15/16 0200)  ketorolac (TORADOL) 30 MG/ML injection 30 mg (30 mg  Intravenous Given 12/15/16 0159)  HYDROmorphone (DILAUDID) injection 2 mg (2 mg Intravenous Given 12/15/16 0200)    Or  HYDROmorphone (DILAUDID) injection 2 mg ( Subcutaneous See Alternative 12/15/16 0200)  HYDROmorphone (DILAUDID) injection 2 mg (2 mg Intravenous Given 12/15/16 0244)    Or  HYDROmorphone (DILAUDID) injection 2 mg ( Subcutaneous See Alternative 12/15/16 0244)  HYDROmorphone (DILAUDID) injection 2 mg (2 mg Intravenous Given 12/15/16 0324)    Or  HYDROmorphone (DILAUDID) injection 2 mg ( Subcutaneous See Alternative 12/15/16 0324)     Initial Impression / Assessment and Plan / ED Course  I have reviewed the triage vital signs and the nursing notes.  Pertinent labs & imaging results that were available during my care of the patient were reviewed by me and considered in my medical decision making (see chart for details).     Patient appears to have an uncomplicated sickle cell pain crisis. Her lab work is at baseline including the anemia and elevated bilirubin. She is feeling much better, now rates her pain as a 3/10 and feels well and up to go home. Afebrile. Discussed return precautions and need for outpatient follow-up.  Final Clinical Impressions(s) / ED Diagnoses   Final diagnoses:  Sickle cell pain crisis [facility])    New Prescriptions New Prescriptions   No medications on file     Sherwood Gambler, MD 12/15/16 [phone]

## 2016-12-15 NOTE — ED Triage Notes (Signed)
Pt reports recurrent pain in her legs and back associated with her sickle cell.

## 2016-12-16 ENCOUNTER — Emergency Department (HOSPITAL_COMMUNITY)
Admission: EM | Admit: 2016-12-16 | Discharge: 2016-12-16 | Disposition: A | Discharge status: Home Health | Payer: Medicare Other | Attending: Emergency Medicine | Admitting: Emergency Medicine

## 2016-12-16 ENCOUNTER — Encounter (HOSPITAL_COMMUNITY): Payer: Self-pay | Admitting: Emergency Medicine

## 2016-12-16 DIAGNOSIS — D5700 Hb-SS disease with crisis, unspecified: Secondary | ICD-10-CM | POA: Insufficient documentation

## 2016-12-16 DIAGNOSIS — Z7982 Long term (current) use of aspirin: Secondary | ICD-10-CM | POA: Diagnosis not present

## 2016-12-16 DIAGNOSIS — D57219 Sickle-cell/Hb-C disease with crisis, unspecified: Secondary | ICD-10-CM | POA: Diagnosis not present

## 2016-12-16 LAB — CBC WITH DIFFERENTIAL/PLATELET
BASOS ABS: 0 10*3/uL (ref 0.0–0.1)
Basophils Relative: 1 %
Eosinophils Absolute: 0.1 10*3/uL (ref 0.0–0.7)
Eosinophils Relative: 2 %
HEMATOCRIT: 25.9 % — AB (ref 36.0–46.0)
HEMOGLOBIN: 9.4 g/dL — AB (ref 12.0–15.0)
LYMPHS PCT: 28 %
Lymphs Abs: 1.7 10*3/uL (ref 0.7–4.0)
MCH: 36.3 pg — ABNORMAL HIGH (ref 26.0–34.0)
MCHC: 36.3 g/dL — ABNORMAL HIGH (ref 30.0–36.0)
MCV: 100 fL (ref 78.0–100.0)
Monocytes Absolute: 0.7 10*3/uL (ref 0.1–1.0)
Monocytes Relative: 12 %
NEUTROS ABS: 3.5 10*3/uL (ref 1.7–7.7)
Neutrophils Relative %: 58 %
Platelets: 229 10*3/uL (ref 150–400)
RBC: 2.59 MIL/uL — AB (ref 3.87–5.11)
RDW: 24.1 % — ABNORMAL HIGH (ref 11.5–15.5)
WBC: 6 10*3/uL (ref 4.0–10.5)

## 2016-12-16 LAB — COMPREHENSIVE METABOLIC PANEL
ALBUMIN: 2.8 g/dL — AB (ref 3.5–5.0)
ALT: 35 U/L (ref 14–54)
ANION GAP: 7 (ref 5–15)
AST: 112 U/L — ABNORMAL HIGH (ref 15–41)
Alkaline Phosphatase: 145 U/L — ABNORMAL HIGH (ref 38–126)
BILIRUBIN TOTAL: 12.8 mg/dL — AB (ref 0.3–1.2)
BUN: <5 mg/dL — ABNORMAL LOW (ref 6–20)
CO2: 23 mmol/L (ref 22–32)
Calcium: 8.8 mg/dL — ABNORMAL LOW (ref 8.9–10.3)
Chloride: 106 mmol/L (ref 101–111)
Creatinine, Ser: 0.53 mg/dL (ref 0.44–1.00)
GFR calc Af Amer: >60 mL/min (ref >60)
GFR calc non Af Amer: >60 mL/min (ref >60)
GLUCOSE: 98 mg/dL (ref 65–99)
POTASSIUM: 3.5 mmol/L (ref 3.5–5.1)
SODIUM: 136 mmol/L (ref 135–145)
TOTAL PROTEIN: 8.1 g/dL (ref 6.5–8.1)

## 2016-12-16 LAB — RETICULOCYTES
RBC.: 2.59 MIL/uL — AB (ref 3.87–5.11)
RETIC COUNT ABSOLUTE: 417 10*3/uL — AB (ref 19.0–186.0)
Retic Ct Pct: 16.1 % — ABNORMAL HIGH (ref 0.4–3.1)

## 2016-12-16 MED ORDER — KETOROLAC TROMETHAMINE 30 MG/ML IJ SOLN
30.0000 mg | INTRAMUSCULAR | Status: AC
  Administered 2016-12-16: 30 mg via INTRAVENOUS
  Filled 2016-12-16: qty 1

## 2016-12-16 MED ORDER — HYDROMORPHONE HCL 2 MG/ML IJ SOLN
2.0000 mg | INTRAMUSCULAR | Status: AC
  Administered 2016-12-16: 2 mg via INTRAVENOUS

## 2016-12-16 MED ORDER — DIPHENHYDRAMINE HCL 50 MG/ML IJ SOLN
25.0000 mg | Freq: Once | INTRAMUSCULAR | Status: AC
  Administered 2016-12-16: 25 mg via INTRAVENOUS

## 2016-12-16 MED ORDER — PROMETHAZINE HCL 12.5 MG PO TABS
25.0000 mg | ORAL_TABLET | ORAL | Status: DC | PRN
  Administered 2016-12-16: 25 mg via ORAL
  Filled 2016-12-16: qty 2

## 2016-12-16 MED ORDER — DIPHENHYDRAMINE HCL 50 MG/ML IJ SOLN
INTRAMUSCULAR | Status: AC
  Filled 2016-12-16: qty 1

## 2016-12-16 MED ORDER — HYDROMORPHONE HCL 2 MG/ML IJ SOLN
2.0000 mg | INTRAMUSCULAR | Status: AC
  Filled 2016-12-16: qty 1

## 2016-12-16 MED ORDER — HYDROMORPHONE HCL 2 MG/ML IJ SOLN: 2.0000 mg | INTRAMUSCULAR | Status: AC

## 2016-12-16 MED ORDER — DIPHENHYDRAMINE HCL 50 MG/ML IJ SOLN
25.0000 mg | Freq: Once | INTRAMUSCULAR | Status: AC
Start: 2016-12-16 — End: 2016-12-16
  Administered 2016-12-16: 25 mg via INTRAVENOUS
  Filled 2016-12-16: qty 1

## 2016-12-16 MED ORDER — HYDROMORPHONE HCL 2 MG/ML IJ SOLN
2.0000 mg | INTRAMUSCULAR | Status: AC
  Administered 2016-12-16: 2 mg via INTRAVENOUS
  Filled 2016-12-16: qty 1

## 2016-12-16 MED ORDER — DEXTROSE-NACL 5-0.45 % IV SOLN
INTRAVENOUS | Status: DC
  Administered 2016-12-16: 11:00:00 via INTRAVENOUS

## 2016-12-16 MED ORDER — HYDROMORPHONE HCL 2 MG/ML IJ SOLN: 2.0000 mg | INTRAMUSCULAR | Status: DC

## 2016-12-16 MED ORDER — OXYCODONE HCL 5 MG PO TABS
10.0000 mg | ORAL_TABLET | Freq: Once | ORAL | Status: DC
  Filled 2016-12-16: qty 2

## 2016-12-16 MED ORDER — HYDROMORPHONE HCL 2 MG/ML IJ SOLN
2.0000 mg | INTRAMUSCULAR | Status: DC
  Administered 2016-12-16 (×2): 2 mg via INTRAVENOUS
  Filled 2016-12-16 (×3): qty 1

## 2016-12-16 NOTE — ED Provider Notes (Signed)
[facility] DEPT Provider Note   CSN: [id number] Arrival date & time: 12/16/16  4174   By signing my name below, I, Delton Prairie, attest that this documentation has been prepared under the direction and in the presence of Merrily Pew, MD  Electronically Signed: Delton Prairie, ED Scribe. 12/16/16. 9:59 AM.   History   Chief Complaint Chief Complaint  Patient presents with  . Sickle Cell Pain Crisis    HPI Comments:  Dawn [name] is a 36 y.o. female, with a PMHx of sickle cell disease, who presents to the Emergency Department complaining of acute onset, constant, moderate pain to her bilateral lower extremities, bilateral upper extremities and back x today. Her lower extremity pain is worse with palpation. She has taken hydroxyurea with no relief. Pt denies fevers, rashes, cough, SOB, abdominal pain, recent illnesses or any other associated symptoms. Pt also denies any recent trauma or bruising to her areas of pain. No other complaints noted.   The history is provided by the patient. No language interpreter was used.    Past Medical History:  Diagnosis Date  . Blood transfusion   . Demand ischemia ([place]) 02/06/2016  . HCAP (healthcare-associated pneumonia) 05/19/2013  . 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 Pulmonary hypertension 02/06/2016   49 mmHg per echo on 02/06/16   . 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 Sickle cell anemia (HCC)   . Sickle cell disease ([place])   . 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 Vitamin B12 deficiency 07/07/2015    Patient Active Problem List   Diagnosis Date Noted  . Sickle cell anemia with pain ([place]) 11/06/2016  . Syncope 10/17/2016  . Dizziness   . Hypomagnesemia   . Swelling of right upper extremity   . 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 Sickle cell crisis ([place]) 10/03/2016  . Positive D dimer   . Pulmonary hypertension 02/16/2016  . 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 Anemia of chronic disease   . Chronic pain syndrome  10/14/2015  . 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 Vitamin B12 deficiency 07/07/2015  . Thrombocytopenia ([place]) 07/06/2015  . Hyperbilirubinemia 09/11/2014  . Macrocytosis 06/13/2014  . Vitamin D deficiency 11/23/2013  . 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 Hypokalemia 03/28/2012  . 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 Elevated LFTs 04/27/2011  . Sickle cell disease ([place]) 04/26/2011    Past Surgical History:  Procedure Laterality Date  .  left knee ACL reconstruction    . CESAREAN SECTION     x 2  . CHOLECYSTECTOMY    . IR GENERIC HISTORICAL  07/17/2016   IR FLUORO GUIDE PORT INSERTION RIGHT 07/17/2016 Aletta Edouard, MD [facility]  . IR GENERIC HISTORICAL  07/17/2016   IR US GUIDE VASC ACCESS RIGHT 07/17/2016 Aletta Edouard, MD [facility]  . IR GENERIC HISTORICAL  10/12/2016   IR PTA VENOUS EXCEPT DIALYSIS CIRCUIT 10/12/2016 Arne Cleveland, MD [facility]  . IR GENERIC HISTORICAL  10/12/2016   IR US GUIDE VASC ACCESS RIGHT 10/12/2016 Arne Cleveland, MD [facility]  . IR GENERIC HISTORICAL  10/12/2016   IR VENO/EXT/UNI RIGHT 10/12/2016 Arne Cleveland, MD [facility]  . PORT-A-CATH REMOVAL Left 07/29/2013   Procedure: REMOVAL PORT-A-CATH;  Surgeon: Scherry Ran, MD;  Location: [facility];  Service: General;  Laterality: Left;  . PORT-A-CATH REMOVAL Right 11/14/2016   Procedure: REMOVAL PORT-A-CATH;  Surgeon: Aviva Signs, MD;  Location: [facility];  Service: General;  Laterality: Right;  .  PORTACATH PLACEMENT    . PORTACATH PLACEMENT Left 02/23/2013   Procedure: INSERTION PORT-A-CATH;  Surgeon: Donato Heinz, MD;  Location: [facility];  Service: General;  Laterality: Left;  . TEE WITHOUT CARDIOVERSION N/A 08/07/2013   Procedure: TRANSESOPHAGEAL ECHOCARDIOGRAM (TEE);  Surgeon: Arnoldo Lenis, MD;  [facility];  Service: Cardiology;  Laterality: N/A;    OB History    Gravida Para Term  Preterm AB Living   1 1   1   1    SAB TAB Ectopic Multiple Live Births                   Home Medications    Prior to Admission medications   Medication Sig Start Date End Date Taking? Authorizing Provider  aspirin EC 81 MG EC tablet Take 1 tablet (81 mg total) by mouth daily. 10/18/16  Yes Leana Gamer, MD  folic acid (FOLVITE) 1 MG tablet Take 1 tablet (1 mg total) by mouth daily. 03/14/16  Yes [name], FNP  gabapentin (NEURONTIN) 100 MG capsule Take 1 capsule (100 mg total) by mouth 3 (three) times daily. 12/13/16  Yes [name], FNP  hydroxyurea (HYDREA) 500 MG capsule Take 2 capsules (1,000 mg total) by mouth daily. May take with food to minimize GI side effects. 03/14/16  Yes [name], FNP  L-glutamine (ENDARI) 5 g PACK Powder Packet Take 5 g by mouth 2 (two) times daily. 12/13/16  Yes [name], FNP  levonorgestrel (MIRENA) 20 MCG/24HR IUD 1 each by Intrauterine route once.   Yes Historical Provider, MD  loratadine (CLARITIN) 10 MG tablet Take 10 mg by mouth daily.   Yes Historical Provider, MD  morphine (MS CONTIN) 15 MG 12 hr tablet Take 1 tablet (15 mg total) by mouth every 12 (twelve) hours. 12/13/16  Yes [name], FNP  Multiple Vitamin (MULTIVITAMIN WITH MINERALS) TABS tablet Take 1 tablet by mouth daily.   Yes Historical Provider, MD  Oxycodone HCl 10 MG TABS Take 1 tablet (10 mg total) by mouth every 4 (four) hours as needed. 12/13/16  Yes [name], FNP  promethazine (PHENERGAN) 12.5 MG tablet Take 1 tablet (12.5 mg total) by mouth every 6 (six) hours as needed for nausea or vomiting. 12/13/16  Yes [name], FNP  Vitamin D, Ergocalciferol, (DRISDOL) 50000 units CAPS capsule Take 1 capsule (50,000 Units total) by mouth every 7 (seven) days. Takes on Sundays. 06/01/16  Yes Micheline Chapman, NP    Family History Family History  Problem Relation Age of Onset  . Sickle cell trait Mother   . Sickle cell trait Father   .  Hypertension Father   . Diabetes Father   . Sickle cell trait Sister   . Cancer Paternal Aunt     Breast    Social History Social History  Substance Use Topics  . Smoking status: Never Smoker  . Smokeless tobacco: Never Used  . Alcohol use No     Allergies   Desferal [deferoxamine]; Latex; Lisinopril; Tape; and Tylenol [acetaminophen]   Review of Systems Review of Systems  Constitutional: Negative for fever.  Respiratory: Negative for cough and shortness of breath.   Gastrointestinal: Negative for abdominal pain.  Musculoskeletal: Positive for myalgias.  Skin: Negative for rash.  All other systems reviewed and are negative.    Physical Exam Updated Vital Signs BP 137/74   Pulse 87   Temp 98.4 F (36.9 C) (Oral)  Resp (!) 22   Ht 5\' 3"  (1.6 m)   Wt 167 lb (75.8 kg)   SpO2 98%   BMI 29.58 kg/m   Physical Exam  Constitutional: She is oriented to person, place, and time. She appears well-developed and well-nourished. No distress.  HENT:  Head: Normocephalic and atraumatic.  Eyes: Conjunctivae are normal.  Cardiovascular: Regular rhythm and normal heart sounds.  Tachycardia present.   Mildly tachycardic  Pulmonary/Chest: Effort normal and breath sounds normal.  Abdominal: She exhibits no distension.  Musculoskeletal: She exhibits tenderness.  Tenderness to proximal thighs.   Neurological: She is alert and oriented to person, place, and time.  Skin: Skin is warm and dry.  Psychiatric: She has a normal mood and affect.  Nursing note and vitals reviewed.   ED Treatments / Results  DIAGNOSTIC STUDIES:  Oxygen Saturation is 97% on RA, normal by my interpretation.    COORDINATION OF CARE:  9:54 AM Discussed treatment plan with pt at bedside and pt agreed to plan.  Labs (all labs ordered are listed, but only abnormal results are displayed) Labs Reviewed  COMPREHENSIVE METABOLIC PANEL - Abnormal; Notable for the following:       Result Value   BUN <5 (*)     Calcium 8.8 (*)    Albumin 2.8 (*)    AST 112 (*)    Alkaline Phosphatase 145 (*)    Total Bilirubin 12.8 (*)    All other components within normal limits  CBC WITH DIFFERENTIAL/PLATELET - Abnormal; Notable for the following:    RBC 2.59 (*)    Hemoglobin 9.4 (*)    HCT 25.9 (*)    MCH 36.3 (*)    MCHC 36.3 (*)    RDW 24.1 (*)    All other components within normal limits  RETICULOCYTES - Abnormal; Notable for the following:    Retic Ct Pct 16.1 (*)    RBC. 2.59 (*)    Retic Count, Manual 417.0 (*)    All other components within normal limits    EKG  EKG Interpretation None       Radiology No results found.  Procedures Procedures (including critical care time)  Medications Ordered in ED Medications  HYDROmorphone (DILAUDID) injection 2 mg (not administered)    Or  HYDROmorphone (DILAUDID) injection 2 mg (not administered)  ketorolac (TORADOL) 30 MG/ML injection 30 mg (30 mg Intravenous Given 12/16/16 1025)  HYDROmorphone (DILAUDID) injection 2 mg (2 mg Intravenous Given 12/16/16 1025)    Or  HYDROmorphone (DILAUDID) injection 2 mg ( Subcutaneous See Alternative 12/16/16 1025)  HYDROmorphone (DILAUDID) injection 2 mg (2 mg Intravenous Given 12/16/16 1137)    Or  HYDROmorphone (DILAUDID) injection 2 mg ( Subcutaneous See Alternative 12/16/16 1137)  diphenhydrAMINE (BENADRYL) injection 25 mg (25 mg Intravenous Given 12/16/16 1025)  diphenhydrAMINE (BENADRYL) injection 25 mg (25 mg Intravenous Given 12/16/16 1418)     Initial Impression / Assessment and Plan / ED Course  I have reviewed the triage vital signs and the nursing notes.  Pertinent labs & imaging results that were available during my care of the patient were reviewed by me and considered in my medical decision making (see chart for details).     Likely typical scc flare. Pain not totally resolved in ER but was resolved to the point of patient being comfortable enough for discharge. Labs at/near baseline.  Plan for dc w/ pcp follow up.  Final Clinical Impressions(s) / ED Diagnoses   Final diagnoses:  Sickle cell  pain crisis [facility])    New Prescriptions Discharge Medication List as of 12/16/2016  3:14 PM      I personally performed the services described in this documentation, which was scribed in my presence. The recorded information has been reviewed and is accurate.    Merrily Pew, MD 12/17/16 [phone]

## 2016-12-17 ENCOUNTER — Telehealth (HOSPITAL_COMMUNITY): Payer: Self-pay | Admitting: *Deleted

## 2016-12-17 ENCOUNTER — Other Ambulatory Visit: Payer: Self-pay | Admitting: Family Medicine

## 2016-12-17 ENCOUNTER — Encounter (HOSPITAL_COMMUNITY): Payer: Self-pay | Admitting: *Deleted

## 2016-12-17 ENCOUNTER — Non-Acute Institutional Stay (HOSPITAL_COMMUNITY)
Admission: AD | Admit: 2016-12-17 | Discharge: 2016-12-17 | Disposition: A | Discharge status: Home / Self Care | Payer: Medicare Other | Source: Ambulatory Visit | Attending: Internal Medicine | Admitting: Internal Medicine

## 2016-12-17 DIAGNOSIS — D5700 Hb-SS disease with crisis, unspecified: Secondary | ICD-10-CM | POA: Insufficient documentation

## 2016-12-17 DIAGNOSIS — Z79891 Long term (current) use of opiate analgesic: Secondary | ICD-10-CM | POA: Insufficient documentation

## 2016-12-17 DIAGNOSIS — Z7982 Long term (current) use of aspirin: Secondary | ICD-10-CM | POA: Insufficient documentation

## 2016-12-17 DIAGNOSIS — Z79899 Other long term (current) drug therapy: Secondary | ICD-10-CM | POA: Diagnosis not present

## 2016-12-17 DIAGNOSIS — R52 Pain, unspecified: Secondary | ICD-10-CM | POA: Diagnosis present

## 2016-12-17 DIAGNOSIS — I878 Other specified disorders of veins: Secondary | ICD-10-CM

## 2016-12-17 MED ORDER — HYDROMORPHONE HCL 2 MG/ML IJ SOLN
2.0000 mg | Freq: Once | INTRAMUSCULAR | Status: AC
  Administered 2016-12-17: 2 mg via SUBCUTANEOUS
  Filled 2016-12-17: qty 1

## 2016-12-17 MED ORDER — PROMETHAZINE HCL 25 MG/ML IJ SOLN
12.5000 mg | Freq: Four times a day (QID) | INTRAMUSCULAR | Status: DC | PRN
  Administered 2016-12-17: 12.5 mg via INTRAMUSCULAR
  Filled 2016-12-17: qty 1

## 2016-12-17 MED ORDER — PROMETHAZINE HCL 25 MG/ML IJ SOLN: 12.5000 mg | Freq: Four times a day (QID) | INTRAMUSCULAR | Status: DC | PRN

## 2016-12-17 MED ORDER — SODIUM CHLORIDE 0.9 % IV SOLN
25.0000 mg | INTRAVENOUS | Status: DC | PRN
  Filled 2016-12-17: qty 0.5

## 2016-12-17 MED ORDER — HYDROMORPHONE 1 MG/ML IV SOLN: INTRAVENOUS | Status: DC

## 2016-12-17 MED ORDER — DEXTROSE-NACL 5-0.45 % IV SOLN: INTRAVENOUS | Status: DC

## 2016-12-17 MED ORDER — DIPHENHYDRAMINE HCL 25 MG PO CAPS: 25.0000 mg | ORAL_CAPSULE | ORAL | Status: DC | PRN

## 2016-12-17 MED ORDER — ONDANSETRON HCL 4 MG/2ML IJ SOLN: 4.0000 mg | Freq: Four times a day (QID) | INTRAMUSCULAR | Status: DC | PRN

## 2016-12-17 MED ORDER — NALOXONE HCL 0.4 MG/ML IJ SOLN: 0.4000 mg | INTRAMUSCULAR | Status: DC | PRN

## 2016-12-17 MED ORDER — SODIUM CHLORIDE 0.9% FLUSH: 9.0000 mL | INTRAVENOUS | Status: DC | PRN

## 2016-12-17 NOTE — Telephone Encounter (Signed)
n

## 2016-12-17 NOTE — Discharge Instructions (Signed)
IR Port placement 12/24/2016 at 12:30.  Have driver bring to appointment here at [facility]

## 2016-12-17 NOTE — H&P (Signed)
[facility] History and Physical   Date: 12/17/2016  Patient name: Dawn [name] Medical record number: [id number] Date of birth: 11-09-1980 Age: 36 y.o. Gender: female PCP: [name], FNP  Attending physician: Tresa Garter, MD  Chief Complaint: Generalized pain  History of Present Illness: [name], a 36 year old female with a history of sickle cell anemia, HbSS presents complaining of pain to upper and lower extremities. Dayzha has had frequent emergency room and inpatient visits over the past several months. She was last evaluated in the emergency department on 12/16/2016 for a pain crisis.  She attributes current pain crisis to changes in weather. Current pain intensity is 10/10 described as constant and sharp. She last had Oxycodone this am around 7 without sustained relief. She denies headache, shortness of breath, paresthesias, dysuria,  nausea, vomiting, or diarrhea.   Meds: Prescriptions Prior to Admission  Medication Sig Dispense Refill Last Dose  . aspirin EC 81 MG EC tablet Take 1 tablet (81 mg total) by mouth daily. 30 tablet 0 12/17/2016 at Unknown time  . folic acid (FOLVITE) 1 MG tablet Take 1 tablet (1 mg total) by mouth daily. 30 tablet 11 12/17/2016 at Unknown time  . gabapentin (NEURONTIN) 100 MG capsule Take 1 capsule (100 mg total) by mouth 3 (three) times daily. 90 capsule 3 12/17/2016 at Unknown time  . hydroxyurea (HYDREA) 500 MG capsule Take 2 capsules (1,000 mg total) by mouth daily. May take with food to minimize GI side effects. 60 capsule 11 12/17/2016 at Unknown time  . L-glutamine (ENDARI) 5 g PACK Powder Packet Take 5 g by mouth 2 (two) times daily. 60 packet 5 12/17/2016 at Unknown time  . loratadine (CLARITIN) 10 MG tablet Take 10 mg by mouth daily.   12/17/2016 at Unknown time  . morphine (MS CONTIN) 15 MG 12 hr tablet Take 1 tablet (15 mg total) by mouth every 12 (twelve) hours. 60 tablet 0 12/17/2016 at Unknown time  . Multiple  Vitamin (MULTIVITAMIN WITH MINERALS) TABS tablet Take 1 tablet by mouth daily.   12/17/2016 at Unknown time  . Oxycodone HCl 10 MG TABS Take 1 tablet (10 mg total) by mouth every 4 (four) hours as needed. 90 tablet 0 12/17/2016 at Unknown time  . promethazine (PHENERGAN) 12.5 MG tablet Take 1 tablet (12.5 mg total) by mouth every 6 (six) hours as needed for nausea or vomiting. 30 tablet 3 12/16/2016 at Unknown time  . Vitamin D, Ergocalciferol, (DRISDOL) 50000 units CAPS capsule Take 1 capsule (50,000 Units total) by mouth every 7 (seven) days. Takes on Sundays. 4 capsule 11 12/16/2016 at Unknown time  . levonorgestrel (MIRENA) 20 MCG/24HR IUD 1 each by Intrauterine route once.   unknown    Allergies: Desferal [deferoxamine]; Latex; Lisinopril; Tape; and Tylenol [acetaminophen] Past Medical History:  Diagnosis Date  . Blood transfusion   . Demand ischemia ([place]) 02/06/2016  . HCAP (healthcare-associated pneumonia) 05/19/2013  . 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 Pulmonary hypertension 02/06/2016   49 mmHg per echo on 02/06/16   . 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 Sickle cell anemia (HCC)   . Sickle cell disease ([place])   . Sickle cell disease, type S ([place])   . Vitamin B12 deficiency 07/07/2015   Past Surgical History:  Procedure Laterality Date  .  left knee ACL reconstruction    . CESAREAN SECTION     x 2  . CHOLECYSTECTOMY    . IR GENERIC HISTORICAL  07/17/2016   IR FLUORO GUIDE PORT INSERTION RIGHT 07/17/2016 Aletta Edouard, MD [facility]  . IR GENERIC HISTORICAL  07/17/2016   IR US GUIDE VASC ACCESS RIGHT 07/17/2016 Aletta Edouard, MD [facility]  . IR GENERIC HISTORICAL  10/12/2016   IR PTA VENOUS EXCEPT DIALYSIS CIRCUIT 10/12/2016 Arne Cleveland, MD [facility]  . IR GENERIC HISTORICAL  10/12/2016   IR US GUIDE VASC ACCESS RIGHT 10/12/2016 Arne Cleveland, MD [facility]  . IR GENERIC HISTORICAL  10/12/2016   IR VENO/EXT/UNI RIGHT 10/12/2016 Arne Cleveland, MD  [facility]  . PORT-A-CATH REMOVAL Left 07/29/2013   Procedure: REMOVAL PORT-A-CATH;  Surgeon: Scherry Ran, MD;  Location: [facility];  Service: General;  Laterality: Left;  . PORT-A-CATH REMOVAL Right 11/14/2016   Procedure: REMOVAL PORT-A-CATH;  Surgeon: Aviva Signs, MD;  Location: [facility];  Service: General;  Laterality: Right;  . PORTACATH PLACEMENT    . PORTACATH PLACEMENT Left 02/23/2013   Procedure: INSERTION PORT-A-CATH;  Surgeon: Donato Heinz, MD;  Location: [facility];  Service: General;  Laterality: Left;  . TEE WITHOUT CARDIOVERSION N/A 08/07/2013   Procedure: TRANSESOPHAGEAL ECHOCARDIOGRAM (TEE);  Surgeon: Arnoldo Lenis, MD;  Location: [facility];  Service: Cardiology;  Laterality: N/A;   Family History  Problem Relation Age of Onset  . Sickle cell trait Mother   . Sickle cell trait Father   . Hypertension Father   . Diabetes Father   . Sickle cell trait Sister   . Cancer Paternal Aunt     Breast   Social History   Social History  . Marital status: Divorced    Spouse name: N/A  . Number of children: N/A  . Years of education: N/A   Occupational History  . Not on file.   Social History Main Topics  . Smoking status: Never Smoker  . Smokeless tobacco: Never Used  . Alcohol use No  . Drug use: No  . Sexual activity: Yes    Birth control/ protection: IUD   Other Topics Concern  . Not on file   Social History Narrative  . No narrative on file    Review of Systems: Constitutional: negative for fatigue Eyes: positive for icterus Ears, nose, mouth, throat, and face: negative Respiratory: negative for asthma, cough, dyspnea on exertion and wheezing Cardiovascular: negative for chest pain, dyspnea, fatigue and irregular heart beat Gastrointestinal: negative for abdominal pain, constipation and nausea Genitourinary:negative for dysuria Hematologic/lymphatic: negative Musculoskeletal:positive for arthralgias and myalgias Neurological: negative for  dizziness, gait problems, headaches and paresthesia Behavioral/Psych: negative Endocrine: negative  Physical Exam: Blood pressure 138/87, pulse 95, temperature 98.9 F (37.2 C), temperature source Oral, resp. rate 20, height 5\' 3"  (1.6 m), weight 167 lb (75.8 kg), SpO2 98 %. BP 138/87 (BP Location: Left Arm, Patient Position: Sitting)   Pulse 95   Temp 98.9 F (37.2 C) (Oral)   Resp 20   Ht 5\' 3"  (1.6 m)   Wt 167 lb (75.8 kg)   SpO2 98%   BMI 29.58 kg/m   General Appearance:    Alert, cooperative, no distress, appears stated age  Head:    Normocephalic, without obvious abnormality, atraumatic  Eyes:    PERRL, conjunctiva/corneas clear, EOM's intact, fundi    benign, Bilateral icterus  Ears:    Normal TM's and external ear canals, both ears  Nose:   Nares normal, septum midline, mucosa normal, no drainage    or sinus tenderness  Throat:   Lips, mucosa, and tongue normal;  teeth and gums normal  Neck:   Supple, symmetrical, trachea midline, no adenopathy;    thyroid:  no enlargement/tenderness/nodules; no carotid   bruit or JVD  Back:     Symmetric, no curvature, ROM normal, no CVA tenderness  Lungs:     Clear to auscultation bilaterally, respirations unlabored  Chest Wall:    No tenderness or deformity   Heart:    Regular rate and rhythm, S1 and S2 normal, no murmur, rub   or gallop  Abdomen:     Soft, non-tender, bowel sounds active all four quadrants,    no masses, no organomegaly  Extremities:   Extremities, atraumatic, no cyanosis or edema  Pulses:   2+ and symmetric all extremities  Skin:   Skin color, texture, turgor normal, no rashes or lesions  Lymph nodes:   Cervical, supraclavicular, and axillary nodes normal  Neurologic:   CNII-XII intact, normal strength, sensation and reflexes    throughout    Lab results: No results found for this or any previous visit (from the past 24 hour(s)).  Imaging results:  No results found.   Assessment & Plan:  Patient will be  admitted to the day infusion center for extended observation  Start IV D5.45 for cellular rehydration at 75/hr  Start Dilaudid PCA High Concentration per weight based protocol.   Patient will be re-evaluated for pain intensity in the context of function and relationship to baseline as care progresses.  If no significant pain relief, will transfer patient to inpatient services for a higher level of care.   Reviewed labs from emergency department form 3/25, all labs consistent with baseline.    Cassandria Drew M 12/17/2016, 10:36 AM

## 2016-12-17 NOTE — Telephone Encounter (Signed)
Pt called requesting to come to the [facility] for treatment. Pt states her pain is 9/10 and it is in her legs, arms and back. Pt stated that she had her last pain med of MS Contin and Oxycodone at 7am. Pt denies fever, chest pain, nausea, vomiting, diarrhea or abdominal pain. Will put patient on hold and check with the provider, pt voiced understanding.  After speaking with the provider, pt was told that she could come in for treatment. Pt voiced understanding.

## 2016-12-17 NOTE — Discharge Summary (Signed)
[facility] Discharge Summary   Patient ID: Dawn [name] MRN: [id number] DOB/AGE: Jan 01, 1981 36 y.o.  Admit date: 12/17/2016 Discharge date: 12/17/2016  Primary Care Physician:  [name], FNP  Admission Diagnoses:  Active Problems:   Sickle cell anemia with pain (HCC)   Discharge Medications:         Current Meds  Medication Sig  . aspirin EC 81 MG EC tablet Take 1 tablet (81 mg total) by mouth daily.  . folic acid (FOLVITE) 1 MG tablet Take 1 tablet (1 mg total) by mouth daily.  Marland Kitchen gabapentin (NEURONTIN) 100 MG capsule Take 1 capsule (100 mg total) by mouth 3 (three) times daily.  . hydroxyurea (HYDREA) 500 MG capsule Take 2 capsules (1,000 mg total) by mouth daily. May take with food to minimize GI side effects.  . L-glutamine (ENDARI) 5 g PACK Powder Packet Take 5 g by mouth 2 (two) times daily.  Marland Kitchen loratadine (CLARITIN) 10 MG tablet Take 10 mg by mouth daily.  Marland Kitchen morphine (MS CONTIN) 15 MG 12 hr tablet Take 1 tablet (15 mg total) by mouth every 12 (twelve) hours.  . Multiple Vitamin (MULTIVITAMIN WITH MINERALS) TABS tablet Take 1 tablet by mouth daily.  . Oxycodone HCl 10 MG TABS Take 1 tablet (10 mg total) by mouth every 4 (four) hours as needed.  . promethazine (PHENERGAN) 12.5 MG tablet Take 1 tablet (12.5 mg total) by mouth every 6 (six) hours as needed for nausea or vomiting.  . Vitamin D, Ergocalciferol, (DRISDOL) 50000 units CAPS capsule Take 1 capsule (50,000 Units total) by mouth every 7 (seven) days. Takes on Sundays.    Consults:  None  Significant Diagnostic Studies:  Dg Chest 2 View  Result Date: 12/09/2016 CLINICAL DATA:  Chest pain for several weeks, worsening with sickle cell crisis. EXAM: CHEST  2 VIEW COMPARISON:  11/11/2016. FINDINGS: Borderline enlarged cardiac silhouette with an interval decrease in size. Mildly hyperexpanded lungs with mild diffuse peribronchial thickening and accentuation of the interstitial markings, without  significant change. The right jugular porta catheter has been removed. No acute bony abnormality. IMPRESSION: No acute abnormality. Stable mild changes of COPD and chronic bronchitis. Electronically Signed   By: Claudie Revering M.D.   On: 12/09/2016 11:49     [facility] Course: Dawn [name] was admitted to the day infusion center for extended observation. The clinic staff was unable to gain IV access due to poor venous access. Patient no longer has a port a cath in place. Notified interventional radiology and an order has been placed for outpatient placement. She has an appointment scheduled for 12/24/2016. She received Dilaudid subcutaneously for a total of 6 mg. Pain intensity decreased from 8-9/10 to 6/10.  and IV analgesics. She has a clinic follow up appointment scheduled. Patient is alert, oriented, and ambulating. Patient will follow up in clinic in 1 week for medication management.  Pt will be discharged home on current medication regimen.    The patient was given clear instructions to go to ER or return to medical center if symptoms do not improve, worsen or new problems develop. The patient verbalized understanding.  Physical Exam at Discharge:   BP 134/68 (BP Location: Left Arm)   Pulse 81   Temp 98.9 F (37.2 C) (Oral)   Resp 18   Ht 5\' 3"  (1.6 m)   Wt 167 lb (75.8 kg)   SpO2 100%   BMI 29.58 kg/m   General Appearance:  Alert, cooperative, no distress, appears stated age  Head:    Normocephalic, without obvious abnormality, atraumatic  Eyes:    PERRL, conjunctiva/corneas clear, EOM's intact, fundi    benign, Bilateral icterus  Back:     Symmetric, no curvature, ROM normal, no CVA tenderness  Lungs:     Clear to auscultation bilaterally, respirations unlabored  Chest Wall:    No tenderness or deformity   Heart:    Regular rate and rhythm, S1 and S2 normal, no murmur, rub   or gallop  Extremities:   Extremities normal, atraumatic, no cyanosis or edema  Pulses:    2+ and symmetric all extremities  Skin:   Skin color, texture, turgor normal, no rashes or lesions  Lymph nodes:   Cervical, supraclavicular, and axillary nodes normal  Neurologic:   CNII-XII intact, normal strength, sensation and reflexes    throughout    Disposition at Discharge: 06-Home-Health Care Svc  Discharge Orders:   Condition at Discharge:   Stable  Time spent on Discharge:  15 minutes  Signed: Hollis,Lachina M 12/17/2016, 4:06 PM

## 2016-12-17 NOTE — Progress Notes (Signed)
Pt received to the [facility] for treatment. Pt stated her pain was 9/10 on admission with pain in her arms, legs and back. Unable to get IV access for this patient even with the IV team. Pt was treated with SQ Dilaudid times 3. Pt's pain was 5/10 at discharge. Pt was scheduled to have a porta cath place on April 2nd. Discharge instructions given with verbal understanding. Pt was alert, oriented and ambulatory at discharge.

## 2016-12-18 ENCOUNTER — Emergency Department (HOSPITAL_COMMUNITY)
Admission: EM | Admit: 2016-12-18 | Discharge: 2016-12-18 | Disposition: A | Discharge status: Home / Self Care | Payer: Medicare Other | Attending: Emergency Medicine | Admitting: Emergency Medicine

## 2016-12-18 ENCOUNTER — Encounter (HOSPITAL_COMMUNITY): Payer: Self-pay | Admitting: Cardiology

## 2016-12-18 DIAGNOSIS — Z7982 Long term (current) use of aspirin: Secondary | ICD-10-CM | POA: Diagnosis not present

## 2016-12-18 DIAGNOSIS — D57219 Sickle-cell/Hb-C disease with crisis, unspecified: Secondary | ICD-10-CM | POA: Diagnosis not present

## 2016-12-18 DIAGNOSIS — D5700 Hb-SS disease with crisis, unspecified: Secondary | ICD-10-CM | POA: Diagnosis not present

## 2016-12-18 DIAGNOSIS — Z79899 Other long term (current) drug therapy: Secondary | ICD-10-CM | POA: Diagnosis not present

## 2016-12-18 MED ORDER — HYDROMORPHONE HCL 2 MG/ML IJ SOLN
2.0000 mg | INTRAMUSCULAR | Status: AC
  Administered 2016-12-18 (×3): 2 mg via INTRAVENOUS
  Filled 2016-12-18 (×3): qty 1

## 2016-12-18 MED ORDER — DIPHENHYDRAMINE HCL 50 MG/ML IJ SOLN
25.0000 mg | Freq: Once | INTRAMUSCULAR | Status: AC
  Administered 2016-12-18: 25 mg via INTRAVENOUS
  Filled 2016-12-18: qty 1

## 2016-12-18 MED ORDER — HYDROMORPHONE HCL 2 MG/ML IJ SOLN
2.0000 mg | Freq: Once | INTRAMUSCULAR | Status: AC
  Administered 2016-12-18: 2 mg via INTRAVENOUS
  Filled 2016-12-18: qty 1

## 2016-12-18 MED ORDER — KETOROLAC TROMETHAMINE 30 MG/ML IJ SOLN
30.0000 mg | Freq: Once | INTRAMUSCULAR | Status: AC
  Administered 2016-12-18: 30 mg via INTRAVENOUS
  Filled 2016-12-18: qty 1

## 2016-12-18 MED ORDER — HYDROMORPHONE HCL 2 MG/ML IJ SOLN: 2.0000 mg | Freq: Once | INTRAMUSCULAR | Status: DC

## 2016-12-18 MED ORDER — SODIUM CHLORIDE 0.9 % IV BOLUS (SEPSIS)
1000.0000 mL | Freq: Once | INTRAVENOUS | Status: AC
  Administered 2016-12-18: 1000 mL via INTRAVENOUS

## 2016-12-18 MED ORDER — PROMETHAZINE HCL 25 MG/ML IJ SOLN
12.5000 mg | Freq: Once | INTRAMUSCULAR | Status: AC
  Administered 2016-12-18: 12.5 mg via INTRAVENOUS
  Filled 2016-12-18: qty 1

## 2016-12-18 NOTE — ED Provider Notes (Signed)
[facility] DEPT Provider Note   CSN: [id number] Arrival date & time: 12/18/16  0820     History   Chief Complaint Chief Complaint  Patient presents with  . Sickle Cell Pain Crisis    HPI Dawn [name] is a 36 y.o. female with past medical history as outlined below presenting with her typical sickle cell pain, which includes her lower back, upper thighs and upper arms despite her home medicines.  She reports for the past month her pain has not resolved at all despite multiple ed visits (most recently was here 2 days ago) and was seen by her pcp yesterday for pain tx as well.  She endorses her pain will reduce to a 3/10 as it was at this level ytd when she left her pcp's office.  She denies fevers, chills, chest pain, abdominal pain and shortness of breath.  She has taken her ms contin this am and one dose of oxycodone without relief.  The history is provided by the patient.    Past Medical History:  Diagnosis Date  . Blood transfusion   . Demand ischemia ([place]) 02/06/2016  . HCAP (healthcare-associated pneumonia) 05/19/2013  . 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 Pulmonary hypertension 02/06/2016   49 mmHg per echo on 02/06/16   . 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 Sickle cell anemia (HCC)   . Sickle cell disease ([place])   . Sickle cell disease, type S ([place])   . Vitamin B12 deficiency 07/07/2015    Patient Active Problem List   Diagnosis Date Noted  . Sickle cell anemia with pain ([place]) 11/06/2016  . Syncope 10/17/2016  . Dizziness   . Hypomagnesemia   . Swelling of right upper extremity   . 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 Sickle cell crisis ([place]) 10/03/2016  . Positive D dimer   . Pulmonary hypertension 02/16/2016  . 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 Anemia of chronic disease   . Chronic pain syndrome 10/14/2015  . 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 Vitamin B12 deficiency 07/07/2015  . Thrombocytopenia ([place]) 07/06/2015  . Hyperbilirubinemia  09/11/2014  . Macrocytosis 06/13/2014  . Vitamin D deficiency 11/23/2013  . Hb-SS disease without crisis ([place]) 11/23/2013  . 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 Hypokalemia 03/28/2012  . 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 Elevated LFTs 04/27/2011  . Sickle cell disease ([place]) 04/26/2011    Past Surgical History:  Procedure Laterality Date  .  left knee ACL reconstruction    . CESAREAN SECTION     x 2  . CHOLECYSTECTOMY    . IR GENERIC HISTORICAL  07/17/2016   IR FLUORO GUIDE PORT INSERTION RIGHT 07/17/2016 Aletta Edouard, MD [facility]  . IR GENERIC HISTORICAL  07/17/2016   IR US GUIDE VASC ACCESS RIGHT 07/17/2016 Aletta Edouard, MD [facility]  . IR GENERIC HISTORICAL  10/12/2016   IR PTA VENOUS EXCEPT DIALYSIS CIRCUIT 10/12/2016 Arne Cleveland, MD [facility]  . IR GENERIC HISTORICAL  10/12/2016   IR US GUIDE VASC ACCESS RIGHT 10/12/2016 Arne Cleveland, MD [facility]  . IR GENERIC HISTORICAL  10/12/2016   IR VENO/EXT/UNI RIGHT 10/12/2016 Arne Cleveland, MD [facility]  . PORT-A-CATH REMOVAL Left 07/29/2013   Procedure: REMOVAL PORT-A-CATH;  Surgeon: Scherry Ran, MD;  Location: [facility];  Service: General;  Laterality: Left;  . PORT-A-CATH REMOVAL Right 11/14/2016   Procedure: REMOVAL PORT-A-CATH;  Surgeon: Aviva Signs, MD;  Location: [facility];  Service: General;  Laterality: Right;  . PORTACATH PLACEMENT    .  PORTACATH PLACEMENT Left 02/23/2013   Procedure: INSERTION PORT-A-CATH;  Surgeon: Donato Heinz, MD;  Location: [facility];  Service: General;  Laterality: Left;  . TEE WITHOUT CARDIOVERSION N/A 08/07/2013   Procedure: TRANSESOPHAGEAL ECHOCARDIOGRAM (TEE);  Surgeon: Arnoldo Lenis, MD;  [facility];  Service: Cardiology;  Laterality: N/A;    OB History    Gravida Para Term Preterm AB Living   1 1   1   1    SAB TAB Ectopic Multiple Live Births                   Home Medications    Prior to Admission medications     Medication Sig Start Date End Date Taking? Authorizing Provider  aspirin EC 81 MG EC tablet Take 1 tablet (81 mg total) by mouth daily. 10/18/16  Yes Leana Gamer, MD  folic acid (FOLVITE) 1 MG tablet Take 1 tablet (1 mg total) by mouth daily. 03/14/16  Yes [name], FNP  gabapentin (NEURONTIN) 100 MG capsule Take 1 capsule (100 mg total) by mouth 3 (three) times daily. 12/13/16  Yes [name], FNP  hydroxyurea (HYDREA) 500 MG capsule Take 2 capsules (1,000 mg total) by mouth daily. May take with food to minimize GI side effects. 03/14/16  Yes [name], FNP  L-glutamine (ENDARI) 5 g PACK Powder Packet Take 5 g by mouth 2 (two) times daily. 12/13/16  Yes [name], FNP  levonorgestrel (MIRENA) 20 MCG/24HR IUD 1 each by Intrauterine route once.   Yes Historical Provider, MD  loratadine (CLARITIN) 10 MG tablet Take 10 mg by mouth daily.   Yes Historical Provider, MD  morphine (MS CONTIN) 15 MG 12 hr tablet Take 1 tablet (15 mg total) by mouth every 12 (twelve) hours. 12/13/16  Yes [name], FNP  Multiple Vitamin (MULTIVITAMIN WITH MINERALS) TABS tablet Take 1 tablet by mouth daily.   Yes Historical Provider, MD  Oxycodone HCl 10 MG TABS Take 1 tablet (10 mg total) by mouth every 4 (four) hours as needed. 12/13/16  Yes [name], FNP  promethazine (PHENERGAN) 12.5 MG tablet Take 1 tablet (12.5 mg total) by mouth every 6 (six) hours as needed for nausea or vomiting. 12/13/16  Yes [name], FNP  Vitamin D, Ergocalciferol, (DRISDOL) 50000 units CAPS capsule Take 1 capsule (50,000 Units total) by mouth every 7 (seven) days. Takes on Sundays. 06/01/16  Yes Micheline Chapman, NP    Family History Family History  Problem Relation Age of Onset  . Sickle cell trait Mother   . Sickle cell trait Father   . Hypertension Father   . Diabetes Father   . Sickle cell trait Sister   . Cancer Paternal Aunt     Breast    Social History Social History   Substance Use Topics  . Smoking status: Never Smoker  . Smokeless tobacco: Never Used  . Alcohol use No     Allergies   Desferal [deferoxamine]; Latex; Lisinopril; Tape; and Tylenol [acetaminophen]   Review of Systems Review of Systems  Constitutional: Negative for chills and fever.  HENT: Negative for congestion and sore throat.   Eyes: Negative.   Respiratory: Negative for chest tightness and shortness of breath.   Cardiovascular: Negative for chest pain and leg swelling.  Gastrointestinal: Negative for abdominal pain and nausea.  Genitourinary: Negative.   Musculoskeletal: Positive for arthralgias. Negative for joint swelling and neck pain.  Skin: Negative.  Negative for rash and wound.  Neurological: Negative for dizziness, weakness, light-headedness, numbness and headaches.  Psychiatric/Behavioral: Negative.      Physical Exam Updated Vital Signs BP 140/86   Pulse 78   Temp 98.4 F (36.9 C) (Oral)   Resp 18   Ht 5\' 3"  (1.6 m)   Wt 75.8 kg   SpO2 97%   BMI 29.58 kg/m   Physical Exam  Constitutional: She appears well-developed and well-nourished.  HENT:  Head: Normocephalic and atraumatic.  Eyes: Scleral icterus is present.  Neck: Normal range of motion.  Cardiovascular: Normal rate, regular rhythm, normal heart sounds and intact distal pulses.   Pulses:      Radial pulses are 2+ on the right side, and 2+ on the left side.       Dorsalis pedis pulses are 2+ on the right side, and 2+ on the left side.  Pulmonary/Chest: Effort normal and breath sounds normal. She has no wheezes.  Abdominal: Soft. Bowel sounds are normal. There is no tenderness.  Musculoskeletal: Normal range of motion.  Thighs, calves and arms soft,  No induration or swelling.  Neurological: She is alert.  Skin: Skin is warm and dry.  Psychiatric: She has a normal mood and affect.  Nursing note and vitals reviewed.    ED Treatments / Results  Labs (all labs ordered are listed, but  only abnormal results are displayed) Labs Reviewed - No data to display  EKG  EKG Interpretation None       Radiology No results found.  Procedures Procedures (including critical care time)  Medications Ordered in ED Medications  HYDROmorphone (DILAUDID) injection 2 mg (not administered)  sodium chloride 0.9 % bolus 1,000 mL (0 mLs Intravenous Stopped 12/18/16 1140)  promethazine (PHENERGAN) injection 12.5 mg (12.5 mg Intravenous Given 12/18/16 0903)  diphenhydrAMINE (BENADRYL) injection 25 mg (25 mg Intravenous Given 12/18/16 0903)  HYDROmorphone (DILAUDID) injection 2 mg (2 mg Intravenous Given 12/18/16 1107)  ketorolac (TORADOL) 30 MG/ML injection 30 mg (30 mg Intravenous Given 12/18/16 1019)     Initial Impression / Assessment and Plan / ED Course  I have reviewed the triage vital signs and the nursing notes.  Pertinent labs & imaging results that were available during my care of the patient were reviewed by me and considered in my medical decision making (see chart for details).    Labs not repeated today, stable from draw 3/25.  Pt 's pain improved but not resolved at time of dc.  States she has not been pain free x 1 month.  She was started on gabapentin and Endari within the past week and is scheduled for an exchange transfusion at [facility] in 2 weeks which should resolve her sx.  She felt her pain was at a level where she could tolerate at home.  No acute findings on exam.  The patient appears reasonably screened and/or stabilized for discharge and I doubt any other medical condition or other [facility] requiring further screening, evaluation, or treatment in the ED at this time prior to discharge.   Final Clinical Impressions(s) / ED Diagnoses   Final diagnoses:  Sickle cell pain crisis [facility])    New Prescriptions New Prescriptions   No medications on file     Evalee Jefferson, PA-C 12/18/16 [place], DO 12/21/16 1720

## 2016-12-18 NOTE — ED Triage Notes (Signed)
Sickle cell pain since midnight

## 2016-12-19 ENCOUNTER — Emergency Department (HOSPITAL_COMMUNITY)
Admission: EM | Admit: 2016-12-19 | Discharge: 2016-12-19 | Disposition: A | Discharge status: Home / Self Care | Payer: Medicare Other | Attending: Emergency Medicine | Admitting: Emergency Medicine

## 2016-12-19 ENCOUNTER — Encounter (HOSPITAL_COMMUNITY): Payer: Self-pay | Admitting: *Deleted

## 2016-12-19 DIAGNOSIS — D5700 Hb-SS disease with crisis, unspecified: Secondary | ICD-10-CM

## 2016-12-19 DIAGNOSIS — M545 Low back pain: Secondary | ICD-10-CM | POA: Diagnosis present

## 2016-12-19 DIAGNOSIS — Z7982 Long term (current) use of aspirin: Secondary | ICD-10-CM | POA: Diagnosis not present

## 2016-12-19 LAB — COMPREHENSIVE METABOLIC PANEL
ALBUMIN: 3 g/dL — AB (ref 3.5–5.0)
ALT: 44 U/L (ref 14–54)
ANION GAP: 8 (ref 5–15)
AST: 142 U/L — ABNORMAL HIGH (ref 15–41)
Alkaline Phosphatase: 150 U/L — ABNORMAL HIGH (ref 38–126)
BUN: 7 mg/dL (ref 6–20)
CO2: 22 mmol/L (ref 22–32)
Calcium: 8.8 mg/dL — ABNORMAL LOW (ref 8.9–10.3)
Chloride: 104 mmol/L (ref 101–111)
Creatinine, Ser: <0.3 mg/dL — ABNORMAL LOW (ref 0.44–1.00)
GLUCOSE: 117 mg/dL — AB (ref 65–99)
POTASSIUM: 4.9 mmol/L (ref 3.5–5.1)
SODIUM: 134 mmol/L — AB (ref 135–145)
Total Bilirubin: 13.3 mg/dL — ABNORMAL HIGH (ref 0.3–1.2)
Total Protein: 8.5 g/dL — ABNORMAL HIGH (ref 6.5–8.1)

## 2016-12-19 LAB — CBC WITH DIFFERENTIAL/PLATELET
BASOS ABS: 0 10*3/uL (ref 0.0–0.1)
BASOS PCT: 0 %
Eosinophils Absolute: 0.1 10*3/uL (ref 0.0–0.7)
Eosinophils Relative: 1 %
HEMATOCRIT: 27.1 % — AB (ref 36.0–46.0)
HEMOGLOBIN: 9.9 g/dL — AB (ref 12.0–15.0)
Lymphocytes Relative: 25 %
Lymphs Abs: 1.8 10*3/uL (ref 0.7–4.0)
MCH: 36.4 pg — ABNORMAL HIGH (ref 26.0–34.0)
MCHC: 36.5 g/dL — AB (ref 30.0–36.0)
MCV: 99.6 fL (ref 78.0–100.0)
MONO ABS: 0.6 10*3/uL (ref 0.1–1.0)
MONOS PCT: 8 %
NEUTROS ABS: 4.8 10*3/uL (ref 1.7–7.7)
NEUTROS PCT: 65 %
Platelets: 263 10*3/uL (ref 150–400)
RBC: 2.72 MIL/uL — ABNORMAL LOW (ref 3.87–5.11)
RDW: 22 % — ABNORMAL HIGH (ref 11.5–15.5)
WBC: 7.3 10*3/uL (ref 4.0–10.5)

## 2016-12-19 LAB — RETICULOCYTES
RBC.: 2.72 MIL/uL — AB (ref 3.87–5.11)
RETIC CT PCT: 14.4 % — AB (ref 0.4–3.1)
Retic Count, Absolute: 391.7 10*3/uL — ABNORMAL HIGH (ref 19.0–186.0)

## 2016-12-19 MED ORDER — HYDROMORPHONE HCL 2 MG/ML IJ SOLN
2.0000 mg | INTRAMUSCULAR | Status: AC
  Administered 2016-12-19: 2 mg via INTRAVENOUS
  Filled 2016-12-19: qty 1

## 2016-12-19 MED ORDER — PROMETHAZINE HCL 12.5 MG PO TABS
25.0000 mg | ORAL_TABLET | Freq: Once | ORAL | Status: AC
  Administered 2016-12-19: 25 mg via ORAL
  Filled 2016-12-19: qty 2

## 2016-12-19 MED ORDER — HYDROMORPHONE HCL 2 MG/ML IJ SOLN: 2.0000 mg | INTRAMUSCULAR | Status: DC

## 2016-12-19 MED ORDER — KETOROLAC TROMETHAMINE 30 MG/ML IJ SOLN
30.0000 mg | INTRAMUSCULAR | Status: AC
  Administered 2016-12-19: 30 mg via INTRAVENOUS
  Filled 2016-12-19: qty 1

## 2016-12-19 MED ORDER — DEXTROSE-NACL 5-0.45 % IV SOLN
INTRAVENOUS | Status: DC
Start: 2016-12-19 — End: 2016-12-19
  Administered 2016-12-19: 12:00:00 via INTRAVENOUS

## 2016-12-19 MED ORDER — ONDANSETRON HCL 4 MG/2ML IJ SOLN
4.0000 mg | Freq: Once | INTRAMUSCULAR | Status: AC
  Administered 2016-12-19: 4 mg via INTRAVENOUS

## 2016-12-19 MED ORDER — HYDROMORPHONE HCL 2 MG/ML IJ SOLN: 2.0000 mg | INTRAMUSCULAR | Status: AC

## 2016-12-19 MED ORDER — HYDROMORPHONE HCL 2 MG/ML IJ SOLN
2.0000 mg | INTRAMUSCULAR | Status: DC
  Administered 2016-12-19: 2 mg via INTRAVENOUS
  Filled 2016-12-19: qty 1

## 2016-12-19 MED ORDER — ONDANSETRON HCL 4 MG/2ML IJ SOLN
INTRAMUSCULAR | Status: AC
  Filled 2016-12-19: qty 2

## 2016-12-19 NOTE — ED Notes (Signed)
Pt reports being seen here yesterday for pain control, reached a 4 or 5 out of 10. Awoke this morning with an 9/10 pain, took home medications with minimal relief. Pain is 8/10 at this time.

## 2016-12-19 NOTE — ED Triage Notes (Signed)
Pt comes in with pain all over related to sickle cell pain. Pt is tearful in triage and expresses that her sickle cell clinic isn't open today. Pt was placed on a trial medication last Friday.

## 2016-12-19 NOTE — ED Provider Notes (Signed)
Emergency Department Provider Note   I have reviewed the triage vital signs and the nursing notes.  By signing my name below, I, Dawn [name], attest that this documentation has been prepared under the direction and in the presence of Margette Fast, MD. Electronically Signed: Ethelle Lyon [name], Scribe. 12/19/2016. 11:52 AM.   HISTORY  Chief Complaint Sickle Cell Pain Crisis   HPI Comments:  Dawn [name] is a 36 y.o. female with a PMHx of Sickle Cell Disease, Left Atrial Dilation, HCAP, and Pulmonary HTN, who presents to the Emergency Department complaining of aching, 8/10, generalized pain possibly related to sickle cell disease onset five days ago. Pt reports five days ago being put on a new trial medication with her pain gradually worsening since that time. The pain is aching and currently in her bilateral shoulders and arms, lower back, and bilateral legs. She notes her Sickle Cell clinic is not open today leading her to be seen at [facility]. Per chart review, pt was seen at [facility] yesterday for pain control reaching a 4-5/10 but awoke this morning with the 9/10 pain now currently at an 8/10 after taking her home medications "around the clock." This is her 12th such visit to the ED for similar pain. Pt denies CP, SOB, fever, chills, and any other complaints at this time.  Past Medical History:  Diagnosis Date  . Blood transfusion   . Demand ischemia ([place]) 02/06/2016  . HCAP (healthcare-associated pneumonia) 05/19/2013  . 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 Pulmonary hypertension 02/06/2016   49 mmHg per echo on 02/06/16   . 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 Sickle cell anemia (HCC)   . Sickle cell disease ([place])   . Sickle cell disease, type S ([place])   . Vitamin B12 deficiency 07/07/2015    Patient Active Problem List   Diagnosis Date Noted  . Sickle cell anemia with pain ([place]) 11/06/2016  . Syncope 10/17/2016  . Dizziness   . Hypomagnesemia   . Swelling of right  upper extremity   . 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 Sickle cell crisis ([place]) 10/03/2016  . Positive D dimer   . Pulmonary hypertension 02/16/2016  . 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 Anemia of chronic disease   . Chronic pain syndrome 10/14/2015  . 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 Vitamin B12 deficiency 07/07/2015  . Thrombocytopenia ([place]) 07/06/2015  . Hyperbilirubinemia 09/11/2014  . Macrocytosis 06/13/2014  . Vitamin D deficiency 11/23/2013  . 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 Hypokalemia 03/28/2012  . 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 Elevated LFTs 04/27/2011  . Sickle cell disease ([place]) 04/26/2011    Past Surgical History:  Procedure Laterality Date  .  left knee ACL reconstruction    . CESAREAN SECTION     x 2  . CHOLECYSTECTOMY    . IR GENERIC HISTORICAL  07/17/2016   IR FLUORO GUIDE PORT INSERTION RIGHT 07/17/2016 Aletta Edouard, MD [facility]  . IR GENERIC HISTORICAL  07/17/2016   IR US GUIDE VASC ACCESS RIGHT 07/17/2016 Aletta Edouard, MD [facility]  . IR GENERIC HISTORICAL  10/12/2016   IR PTA VENOUS EXCEPT DIALYSIS CIRCUIT 10/12/2016 Arne Cleveland, MD [facility]  . IR GENERIC HISTORICAL  10/12/2016   IR US GUIDE VASC ACCESS RIGHT 10/12/2016 Arne Cleveland, MD [facility]  . IR GENERIC HISTORICAL  10/12/2016   IR VENO/EXT/UNI RIGHT 10/12/2016 Arne Cleveland, MD [facility]  . PORT-A-CATH REMOVAL  Left 07/29/2013   Procedure: REMOVAL PORT-A-CATH;  Surgeon: Scherry Ran, MD;  Location: [facility];  Service: General;  Laterality: Left;  . PORT-A-CATH REMOVAL Right 11/14/2016   Procedure: REMOVAL PORT-A-CATH;  Surgeon: Aviva Signs, MD;  Location: [facility];  Service: General;  Laterality: Right;  . PORTACATH PLACEMENT    . PORTACATH PLACEMENT Left 02/23/2013   Procedure: INSERTION PORT-A-CATH;  Surgeon: Donato Heinz, MD;  Location: [facility];  Service: General;   Laterality: Left;  . TEE WITHOUT CARDIOVERSION N/A 08/07/2013   Procedure: TRANSESOPHAGEAL ECHOCARDIOGRAM (TEE);  Surgeon: Arnoldo Lenis, MD;  [facility];  Service: Cardiology;  Laterality: N/A;    Current Outpatient Rx  . Order #: [id number] Class: Normal  . Order #: [id number] Class: Normal  . Order #: [id number] Class: Print  . Order #: [id number] Class: Normal  . Order #: [id number] Class: Print  . Order #: [id number] Class: Historical Med  . Order #: [id number] Class: Historical Med  . Order #: [id number] Class: Print  . Order #: [id number] Class: Historical Med  . Order #: [id number] Class: Print  . Order #: [id number] Class: Print  . Order #: [id number] Class: Normal    Allergies Desferal [deferoxamine]; Latex; Lisinopril; Tape; and Tylenol [acetaminophen]  Family History  Problem Relation Age of Onset  . Sickle cell trait Mother   . Sickle cell trait Father   . Hypertension Father   . Diabetes Father   . Sickle cell trait Sister   . Cancer Paternal Aunt     Breast    Social History Social History  Substance Use Topics  . Smoking status: Never Smoker  . Smokeless tobacco: Never Used  . Alcohol use No    Review of Systems Constitutional: No fever/chills Eyes: No visual changes. ENT: No sore throat. Cardiovascular: Denies chest pain. Respiratory: Denies shortness of breath. Gastrointestinal: No abdominal pain.  No nausea, no vomiting.  No diarrhea.  No constipation. Genitourinary: Negative for dysuria. Musculoskeletal: Positive for back, shoulder, arm and leg pain. Skin: Negative for rash. Neurological: Negative for headaches, focal weakness or numbness. 10-point ROS otherwise negative.  ____________________________________________   PHYSICAL EXAM:  VITAL SIGNS: ED Triage Vitals  Enc Vitals Group     BP 12/19/16 1121 (!) 156/101     Pulse Rate 12/19/16 1121 96     Resp 12/19/16 1121 18     Temp 12/19/16 1121 98.8 F (37.1 C)     Temp Source 12/19/16  1121 Oral     SpO2 12/19/16 1121 98 %     Weight 12/19/16 1119 167 lb (75.8 kg)     Height 12/19/16 1119 5\' 3"  (1.6 m)     Pain Score 12/19/16 1119 8    Constitutional: Alert and oriented. Well appearing and in no acute distress. Eyes: Conjunctivae are normal.  Head: Atraumatic. Nose: No congestion/rhinnorhea. Mouth/Throat: Mucous membranes are moist.  Oropharynx non-erythematous. Neck: No stridor.  Cardiovascular: Normal rate, regular rhythm. Good peripheral circulation. Grossly normal heart sounds.   Respiratory: Normal respiratory effort.  No retractions. Lungs CTAB. Gastrointestinal: Soft and nontender. No distention.  Musculoskeletal: No lower extremity tenderness nor edema. No gross deformities of extremities. Neurologic:  Normal speech and language. No gross focal neurologic deficits are appreciated.  Skin:  Skin is warm, dry and intact. No rash noted. Psychiatric: Mood and affect are normal. Speech and behavior are normal.  ____________________________________________   LABS (all labs ordered are listed, but only abnormal results are displayed)  Labs Reviewed  COMPREHENSIVE METABOLIC PANEL - Abnormal; Notable for the following:  Result Value   Sodium 134 (*)    Glucose, Bld 117 (*)    Creatinine, Ser <0.30 (*)    Calcium 8.8 (*)    Total Protein 8.5 (*)    Albumin 3.0 (*)    AST 142 (*)    Alkaline Phosphatase 150 (*)    Total Bilirubin 13.3 (*)    All other components within normal limits  CBC WITH DIFFERENTIAL/PLATELET - Abnormal; Notable for the following:    RBC 2.72 (*)    Hemoglobin 9.9 (*)    HCT 27.1 (*)    MCH 36.4 (*)    MCHC 36.5 (*)    RDW 22.0 (*)    All other components within normal limits  RETICULOCYTES - Abnormal; Notable for the following:    Retic Ct Pct 14.4 (*)    RBC. 2.72 (*)    Retic Count, Manual 391.7 (*)    All other components within normal limits    ____________________________________________   PROCEDURES  Procedure(s) performed:   Procedures  None ____________________________________________   INITIAL IMPRESSION / ASSESSMENT AND PLAN / ED COURSE  Pertinent labs & imaging results that were available during my care of the patient were reviewed by me and considered in my medical decision making (see chart for details).  Patient resents to the emergency department for evaluation of sickle cell crisis pain. She's had multiple presentations to the emergency department this month including several this week. Reports that the weather is making him much worse. Reportedly has exchange transfusion at [facility] scheduled for early next week. Labs drawn at triage reviewed. Plan to initiate sickle cell protocol and admitted to control pain. Given the number of the presentations I encouraged the patient to consider admission for further pain control. No evidence of infection or signs/symptoms of acute chest.  01:40 PM Patient with improved pain after 2 doses of dilaudid.   02:56 PM Patient reports feeling much better. Discussed her frequent ED presentations and encouraged admission to ensure more complete pain control. Patient would prefer to return home and take home medications. She is having an exchange transfusion at [facility] next week and the weather is warmer so she thinks she will do well at home this time.   At this time, I do not feel there is any life-threatening condition present. I have reviewed and discussed all results (EKG, imaging, lab, urine as appropriate), exam findings with patient. I have reviewed nursing notes and appropriate previous records.  I feel the patient is safe to be discharged home without further emergent workup. Discussed usual and customary return precautions. Patient and family (if present) verbalize understanding and are comfortable with this plan.  Patient will follow-up with their primary care provider. If they do  not have a primary care provider, information for follow-up has been provided to them. All questions have been answered.  ____________________________________________  FINAL CLINICAL IMPRESSION(S) / ED DIAGNOSES  Final diagnoses:  Sickle cell pain crisis ([place])     MEDICATIONS GIVEN DURING THIS VISIT:  Medications  dextrose 5 %-0.45 % sodium chloride infusion ( Intravenous New Bag/Given 12/19/16 1221)  HYDROmorphone (DILAUDID) injection 2 mg (2 mg Intravenous Given 12/19/16 1443)    Or  HYDROmorphone (DILAUDID) injection 2 mg ( Subcutaneous See Alternative 12/19/16 1443)  ketorolac (TORADOL) 30 MG/ML injection 30 mg (30 mg Intravenous Given 12/19/16 1212)  HYDROmorphone (DILAUDID) injection 2 mg (2 mg Intravenous Given 12/19/16 1222)    Or  HYDROmorphone (DILAUDID) injection 2 mg ( Subcutaneous See Alternative 12/19/16  1222)  HYDROmorphone (DILAUDID) injection 2 mg (2 mg Intravenous Given 12/19/16 1310)    Or  HYDROmorphone (DILAUDID) injection 2 mg ( Subcutaneous See Alternative 12/19/16 1310)  HYDROmorphone (DILAUDID) injection 2 mg (2 mg Intravenous Given 12/19/16 1349)    Or  HYDROmorphone (DILAUDID) injection 2 mg ( Subcutaneous See Alternative 12/19/16 1349)  ondansetron (ZOFRAN) injection 4 mg (4 mg Intravenous Given 12/19/16 1221)  promethazine (PHENERGAN) tablet 25 mg (25 mg Oral Given 12/19/16 1415)     NEW OUTPATIENT MEDICATIONS STARTED DURING THIS VISIT:  None   I personally performed the services described in this documentation, which was scribed in my presence. The recorded information has been reviewed and is accurate.     Note:  This document was prepared using Dragon voice recognition software and may include unintentional dictation errors.  Nanda Quinton, MD Emergency Medicine   Margette Fast, MD 12/19/16 [phone]

## 2016-12-19 NOTE — Discharge Instructions (Signed)
You were seen in the ED today with sickle cell pain crisis. We were able to control your pain well in the ED. We offered admission but you would prefer to try and treat pain at home.   Return with any new or worsening pain, fever, chills, difficulty breathing, or chest pain.

## 2016-12-20 ENCOUNTER — Ambulatory Visit: Payer: Medicare Other | Admitting: Family Medicine

## 2016-12-20 ENCOUNTER — Other Ambulatory Visit: Payer: Self-pay | Admitting: Radiology

## 2016-12-21 NOTE — Patient Instructions (Signed)
Arrive in Radiology at [facility] at 1230, NPO after midnight, and have a driver.

## 2016-12-22 ENCOUNTER — Encounter (HOSPITAL_COMMUNITY): Payer: Self-pay | Admitting: *Deleted

## 2016-12-22 ENCOUNTER — Emergency Department (HOSPITAL_COMMUNITY)
Admission: EM | Admit: 2016-12-22 | Discharge: 2016-12-22 | Disposition: A | Discharge status: Home / Self Care | Payer: Medicare Other | Attending: Emergency Medicine | Admitting: Emergency Medicine

## 2016-12-22 DIAGNOSIS — Z7982 Long term (current) use of aspirin: Secondary | ICD-10-CM | POA: Diagnosis not present

## 2016-12-22 DIAGNOSIS — Z91040 Latex allergy status: Secondary | ICD-10-CM | POA: Diagnosis not present

## 2016-12-22 DIAGNOSIS — D5700 Hb-SS disease with crisis, unspecified: Secondary | ICD-10-CM | POA: Insufficient documentation

## 2016-12-22 DIAGNOSIS — Z79899 Other long term (current) drug therapy: Secondary | ICD-10-CM | POA: Insufficient documentation

## 2016-12-22 DIAGNOSIS — M545 Low back pain: Secondary | ICD-10-CM | POA: Diagnosis present

## 2016-12-22 LAB — CBC WITH DIFFERENTIAL/PLATELET
BASOS ABS: 0 10*3/uL (ref 0.0–0.1)
BASOS PCT: 0 %
EOS ABS: 0.1 10*3/uL (ref 0.0–0.7)
Eosinophils Relative: 1 %
HCT: 24.4 % — ABNORMAL LOW (ref 36.0–46.0)
HEMOGLOBIN: 9 g/dL — AB (ref 12.0–15.0)
Lymphocytes Relative: 22 %
Lymphs Abs: 1.8 10*3/uL (ref 0.7–4.0)
MCH: 36.4 pg — AB (ref 26.0–34.0)
MCHC: 36.9 g/dL — ABNORMAL HIGH (ref 30.0–36.0)
MCV: 98.8 fL (ref 78.0–100.0)
MONO ABS: 1 10*3/uL (ref 0.1–1.0)
Monocytes Relative: 13 %
NEUTROS PCT: 64 %
Neutro Abs: 5.1 10*3/uL (ref 1.7–7.7)
PLATELETS: 260 10*3/uL (ref 150–400)
RBC: 2.47 MIL/uL — ABNORMAL LOW (ref 3.87–5.11)
RDW: 21.5 % — ABNORMAL HIGH (ref 11.5–15.5)
WBC: 8 10*3/uL (ref 4.0–10.5)

## 2016-12-22 LAB — BASIC METABOLIC PANEL
Anion gap: 5 (ref 5–15)
BUN: 5 mg/dL — AB (ref 6–20)
CO2: 23 mmol/L (ref 22–32)
CREATININE: 0.4 mg/dL — AB (ref 0.44–1.00)
Calcium: 8.6 mg/dL — ABNORMAL LOW (ref 8.9–10.3)
Chloride: 109 mmol/L (ref 101–111)
GFR calc non Af Amer: >60 mL/min (ref >60)
Glucose, Bld: 84 mg/dL (ref 65–99)
POTASSIUM: 4.6 mmol/L (ref 3.5–5.1)
SODIUM: 137 mmol/L (ref 135–145)

## 2016-12-22 LAB — URINALYSIS, ROUTINE W REFLEX MICROSCOPIC
Bilirubin Urine: NEGATIVE
Glucose, UA: NEGATIVE mg/dL
Ketones, ur: NEGATIVE mg/dL
Nitrite: NEGATIVE
PH: 7 (ref 5.0–8.0)
Protein, ur: 30 mg/dL — AB
SPECIFIC GRAVITY, URINE: 1.006 (ref 1.005–1.030)

## 2016-12-22 LAB — RETICULOCYTES
RBC.: 2.47 MIL/uL — AB (ref 3.87–5.11)
RETIC CT PCT: 16.6 % — AB (ref 0.4–3.1)
Retic Count, Absolute: 410 10*3/uL — ABNORMAL HIGH (ref 19.0–186.0)

## 2016-12-22 MED ORDER — HYDROMORPHONE HCL 1 MG/ML IJ SOLN
1.0000 mg | Freq: Once | INTRAMUSCULAR | Status: AC
  Administered 2016-12-22: 1 mg via INTRAVENOUS
  Filled 2016-12-22: qty 1

## 2016-12-22 MED ORDER — SODIUM CHLORIDE 0.9 % IV BOLUS (SEPSIS)
1000.0000 mL | Freq: Once | INTRAVENOUS | Status: AC
  Administered 2016-12-22: 1000 mL via INTRAVENOUS

## 2016-12-22 MED ORDER — ONDANSETRON HCL 4 MG/2ML IJ SOLN
4.0000 mg | Freq: Once | INTRAMUSCULAR | Status: AC
  Administered 2016-12-22: 4 mg via INTRAVENOUS
  Filled 2016-12-22: qty 2

## 2016-12-22 MED ORDER — KETOROLAC TROMETHAMINE 30 MG/ML IJ SOLN
30.0000 mg | Freq: Once | INTRAMUSCULAR | Status: AC
  Administered 2016-12-22: 30 mg via INTRAVENOUS
  Filled 2016-12-22: qty 1

## 2016-12-22 MED ORDER — DIPHENHYDRAMINE HCL 50 MG/ML IJ SOLN
25.0000 mg | Freq: Once | INTRAMUSCULAR | Status: AC
  Administered 2016-12-22: 25 mg via INTRAVENOUS
  Filled 2016-12-22: qty 1

## 2016-12-22 NOTE — ED Provider Notes (Signed)
[facility] DEPT Provider Note   CSN: [id number] Arrival date & time: 12/22/16  0719     History   Chief Complaint Chief Complaint  Patient presents with  . Sickle Cell Pain Crisis    HPI Dawn [name] is a 36 y.o. female.  Patient with a known history of sickle cell anemia presents with pain in her legs, arms, lower back. This is not unusual for her. She has frequent pain crisis'. No chest pain dyspnea, dysuria, fever, sweats, chills. Severity of pain is moderate. Nothing makes symptoms better or worse.      Past Medical History:  Diagnosis Date  . Blood transfusion   . Demand ischemia ([place]) 02/06/2016  . HCAP (healthcare-associated pneumonia) 05/19/2013  . 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 Pulmonary hypertension 02/06/2016   49 mmHg per echo on 02/06/16   . 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 Sickle cell anemia (HCC)   . Sickle cell disease ([place])   . Sickle cell disease, type S ([place])   . Vitamin B12 deficiency 07/07/2015    Patient Active Problem List   Diagnosis Date Noted  . Sickle cell anemia with pain ([place]) 11/06/2016  . Syncope 10/17/2016  . Dizziness   . Hypomagnesemia   . Swelling of right upper extremity   . 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 Sickle cell crisis ([place]) 10/03/2016  . Positive D dimer   . Pulmonary hypertension 02/16/2016  . 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 Anemia of chronic disease   . Chronic pain syndrome 10/14/2015  . 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 Vitamin B12 deficiency 07/07/2015  . Thrombocytopenia ([place]) 07/06/2015  . Hyperbilirubinemia 09/11/2014  . Macrocytosis 06/13/2014  . Vitamin D deficiency 11/23/2013  . 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 Hypokalemia 03/28/2012  . 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 Elevated LFTs 04/27/2011  . Sickle cell disease ([place]) 04/26/2011    Past Surgical History:  Procedure Laterality Date   .  left knee ACL reconstruction    . CESAREAN SECTION     x 2  . CHOLECYSTECTOMY    . IR GENERIC HISTORICAL  07/17/2016   IR FLUORO GUIDE PORT INSERTION RIGHT 07/17/2016 Aletta Edouard, MD [facility]  . IR GENERIC HISTORICAL  07/17/2016   IR US GUIDE VASC ACCESS RIGHT 07/17/2016 Aletta Edouard, MD [facility]  . IR GENERIC HISTORICAL  10/12/2016   IR PTA VENOUS EXCEPT DIALYSIS CIRCUIT 10/12/2016 Arne Cleveland, MD [facility]  . IR GENERIC HISTORICAL  10/12/2016   IR US GUIDE VASC ACCESS RIGHT 10/12/2016 Arne Cleveland, MD [facility]  . IR GENERIC HISTORICAL  10/12/2016   IR VENO/EXT/UNI RIGHT 10/12/2016 Arne Cleveland, MD [facility]  . PORT-A-CATH REMOVAL Left 07/29/2013   Procedure: REMOVAL PORT-A-CATH;  Surgeon: Scherry Ran, MD;  Location: [facility];  Service: General;  Laterality: Left;  . PORT-A-CATH REMOVAL Right 11/14/2016   Procedure: REMOVAL PORT-A-CATH;  Surgeon: Aviva Signs, MD;  Location: [facility];  Service: General;  Laterality: Right;  . PORTACATH PLACEMENT    . PORTACATH PLACEMENT Left 02/23/2013   Procedure: INSERTION PORT-A-CATH;  Surgeon: Donato Heinz, MD;  Location: [facility];  Service: General;  Laterality: Left;  . TEE WITHOUT CARDIOVERSION N/A 08/07/2013   Procedure: TRANSESOPHAGEAL ECHOCARDIOGRAM (TEE);  Surgeon: Arnoldo Lenis, MD;  [facility];  Service: Cardiology;  Laterality: N/A;    OB History    Gravida Para Term Preterm AB Living   1  1   1   1    SAB TAB Ectopic Multiple Live Births                   Home Medications    Prior to Admission medications   Medication Sig Start Date End Date Taking? Authorizing Provider  aspirin EC 81 MG EC tablet Take 1 tablet (81 mg total) by mouth daily. 10/18/16  Yes Leana Gamer, MD  folic acid (FOLVITE) 1 MG tablet Take 1 tablet (1 mg total) by mouth daily. 03/14/16  Yes [name], FNP  gabapentin (NEURONTIN) 100 MG capsule Take 1 capsule (100 mg total) by mouth 3 (three) times  daily. 12/13/16  Yes [name], FNP  hydroxyurea (HYDREA) 500 MG capsule Take 2 capsules (1,000 mg total) by mouth daily. May take with food to minimize GI side effects. 03/14/16  Yes [name], FNP  L-glutamine (ENDARI) 5 g PACK Powder Packet Take 5 g by mouth 2 (two) times daily. 12/13/16  Yes [name], FNP  loratadine (CLARITIN) 10 MG tablet Take 10 mg by mouth daily.   Yes Historical Provider, MD  morphine (MS CONTIN) 15 MG 12 hr tablet Take 1 tablet (15 mg total) by mouth every 12 (twelve) hours. 12/13/16  Yes [name], FNP  Multiple Vitamin (MULTIVITAMIN WITH MINERALS) TABS tablet Take 1 tablet by mouth daily.   Yes Historical Provider, MD  Oxycodone HCl 10 MG TABS Take 1 tablet (10 mg total) by mouth every 4 (four) hours as needed. Patient taking differently: Take 10 mg by mouth every 4 (four) hours as needed. pain 12/13/16  Yes [name], FNP  levonorgestrel (MIRENA) 20 MCG/24HR IUD 1 each by Intrauterine route once.    Historical Provider, MD  promethazine (PHENERGAN) 12.5 MG tablet Take 1 tablet (12.5 mg total) by mouth every 6 (six) hours as needed for nausea or vomiting. 12/13/16   [name], FNP  Vitamin D, Ergocalciferol, (DRISDOL) 50000 units CAPS capsule Take 1 capsule (50,000 Units total) by mouth every 7 (seven) days. Takes on Sundays. 06/01/16   Micheline Chapman, NP    Family History Family History  Problem Relation Age of Onset  . Sickle cell trait Mother   . Sickle cell trait Father   . Hypertension Father   . Diabetes Father   . Sickle cell trait Sister   . Cancer Paternal Aunt     Breast    Social History Social History  Substance Use Topics  . Smoking status: Never Smoker  . Smokeless tobacco: Never Used  . Alcohol use No     Allergies   Desferal [deferoxamine]; Latex; Lisinopril; Tape; and Tylenol [acetaminophen]   Review of Systems Review of Systems  All other systems reviewed and are negative.    Physical  Exam Updated Vital Signs BP (!) 146/91   Pulse 81   Temp 98.3 F (36.8 C) (Oral)   Resp 18   Ht 5\' 3"  (1.6 m)   Wt 167 lb (75.8 kg)   SpO2 97%   BMI 29.58 kg/m   Physical Exam  Constitutional: She is oriented to person, place, and time. She appears well-developed and well-nourished.  HENT:  Head: Normocephalic and atraumatic.  Eyes: Conjunctivae are normal.  Neck: Neck supple.  Cardiovascular: Normal rate and regular rhythm.   Pulmonary/Chest: Effort normal and breath sounds normal.  Abdominal: Soft. Bowel sounds are normal.  Musculoskeletal: Normal range of motion.  Neurological: She is  alert and oriented to person, place, and time.  Skin: Skin is warm and dry.  Psychiatric: She has a normal mood and affect. Her behavior is normal.  Nursing note and vitals reviewed.    ED Treatments / Results  Labs (all labs ordered are listed, but only abnormal results are displayed) Labs Reviewed  CBC WITH DIFFERENTIAL/PLATELET - Abnormal; Notable for the following:       Result Value   RBC 2.47 (*)    Hemoglobin 9.0 (*)    HCT 24.4 (*)    MCH 36.4 (*)    MCHC 36.9 (*)    RDW 21.5 (*)    All other components within normal limits  BASIC METABOLIC PANEL - Abnormal; Notable for the following:    BUN 5 (*)    Creatinine, Ser 0.40 (*)    Calcium 8.6 (*)    All other components within normal limits  RETICULOCYTES - Abnormal; Notable for the following:    Retic Ct Pct 16.6 (*)    RBC. 2.47 (*)    Retic Count, Manual 410.0 (*)    All other components within normal limits  URINALYSIS, ROUTINE W REFLEX MICROSCOPIC - Abnormal; Notable for the following:    Color, Urine AMBER (*)    Hgb urine dipstick MODERATE (*)    Protein, ur 30 (*)    Leukocytes, UA MODERATE (*)    Bacteria, UA RARE (*)    Squamous Epithelial / LPF 0-5 (*)    All other components within normal limits    EKG  EKG Interpretation None       Radiology No results found.  Procedures Procedures (including  critical care time)  Medications Ordered in ED Medications  sodium chloride 0.9 % bolus 1,000 mL (1,000 mLs Intravenous New Bag/Given 12/22/16 0930)  ketorolac (TORADOL) 30 MG/ML injection 30 mg (30 mg Intravenous Given 12/22/16 0828)  HYDROmorphone (DILAUDID) injection 1 mg (1 mg Intravenous Given 12/22/16 0828)  ondansetron (ZOFRAN) injection 4 mg (4 mg Intravenous Given 12/22/16 0828)  sodium chloride 0.9 % bolus 1,000 mL (0 mLs Intravenous Stopped 12/22/16 0930)  HYDROmorphone (DILAUDID) injection 1 mg (1 mg Intravenous Given 12/22/16 0949)  diphenhydrAMINE (BENADRYL) injection 25 mg (25 mg Intravenous Given 12/22/16 0950)  HYDROmorphone (DILAUDID) injection 1 mg (1 mg Intravenous Given 12/22/16 1357)     Initial Impression / Assessment and Plan / ED Course  I have reviewed the triage vital signs and the nursing notes.  Pertinent labs & imaging results that were available during my care of the patient were reviewed by me and considered in my medical decision making (see chart for details).     Patient has known sickle cell anemia and frequent pain crisis. Will hydrate, treat pain, check labs  Recheck at 1430:  Patient is feeling much better. Decreased pain. She is alert  Final Clinical Impressions(s) / ED Diagnoses   Final diagnoses:  Sickle cell pain crisis [facility])    New Prescriptions New Prescriptions   No medications on file     Nat Christen, MD 12/22/16 1438

## 2016-12-22 NOTE — Discharge Instructions (Signed)
Increase fluids. Take your pain medication. Follow-up with your primary care doctor.

## 2016-12-22 NOTE — ED Triage Notes (Signed)
Pt c/o generalized pain related to sickle cell crisis that started last night around 2100. Pt has been seen in the sickle cell clinic last Friday and had a new medication added but pt has not seen any difference with it.

## 2016-12-24 ENCOUNTER — Encounter (HOSPITAL_COMMUNITY): Payer: Self-pay

## 2016-12-24 ENCOUNTER — Ambulatory Visit (HOSPITAL_COMMUNITY)
Admission: RE | Admit: 2016-12-24 | Discharge: 2016-12-24 | Disposition: A | Discharge status: Home / Self Care | Payer: Medicare Other | Source: Ambulatory Visit | Attending: Family Medicine | Admitting: Family Medicine

## 2016-12-24 ENCOUNTER — Other Ambulatory Visit: Payer: Self-pay | Admitting: Family Medicine

## 2016-12-24 ENCOUNTER — Other Ambulatory Visit (HOSPITAL_COMMUNITY): Payer: Medicare Other

## 2016-12-24 DIAGNOSIS — Z452 Encounter for adjustment and management of vascular access device: Secondary | ICD-10-CM | POA: Diagnosis not present

## 2016-12-24 DIAGNOSIS — D571 Sickle-cell disease without crisis: Secondary | ICD-10-CM | POA: Diagnosis not present

## 2016-12-24 DIAGNOSIS — I878 Other specified disorders of veins: Secondary | ICD-10-CM

## 2016-12-24 HISTORY — PX: IR FLUORO GUIDE PORT INSERTION RIGHT: IMG5741

## 2016-12-24 HISTORY — PX: IR US GUIDE VASC ACCESS RIGHT: IMG2390

## 2016-12-24 LAB — CBC WITH DIFFERENTIAL/PLATELET
BASOS ABS: 0 10*3/uL (ref 0.0–0.1)
BASOS PCT: 0 %
EOS PCT: 2 %
Eosinophils Absolute: 0.2 10*3/uL (ref 0.0–0.7)
HEMATOCRIT: 22.1 % — AB (ref 36.0–46.0)
HEMOGLOBIN: 8.4 g/dL — AB (ref 12.0–15.0)
LYMPHS ABS: 2.2 10*3/uL (ref 0.7–4.0)
Lymphocytes Relative: 29 %
MCH: 36.8 pg — ABNORMAL HIGH (ref 26.0–34.0)
MCHC: 38 g/dL — AB (ref 30.0–36.0)
MCV: 96.9 fL (ref 78.0–100.0)
MONO ABS: 1 10*3/uL (ref 0.1–1.0)
Monocytes Relative: 13 %
NRBC: 2 /100{WBCs} — AB
Neutro Abs: 4.1 10*3/uL (ref 1.7–7.7)
Neutrophils Relative %: 56 %
Platelets: 240 10*3/uL (ref 150–400)
RBC: 2.28 MIL/uL — ABNORMAL LOW (ref 3.87–5.11)
RDW: 20.5 % — AB (ref 11.5–15.5)
WBC: 7.5 10*3/uL (ref 4.0–10.5)

## 2016-12-24 LAB — PROTIME-INR
INR: 1.24
Prothrombin Time: 15.7 seconds — ABNORMAL HIGH (ref 11.4–15.2)

## 2016-12-24 MED ORDER — LIDOCAINE HCL 1 % IJ SOLN
INTRAMUSCULAR | Status: AC | PRN
  Administered 2016-12-24: 10 mL
  Administered 2016-12-24: 20 mL

## 2016-12-24 MED ORDER — OXYCODONE HCL 5 MG PO TABS
10.0000 mg | ORAL_TABLET | ORAL | Status: DC | PRN
  Administered 2016-12-24: 10 mg via ORAL
  Filled 2016-12-24: qty 2

## 2016-12-24 MED ORDER — FENTANYL CITRATE (PF) 100 MCG/2ML IJ SOLN
INTRAMUSCULAR | Status: DC
Start: 2016-12-24 — End: 2016-12-25
  Filled 2016-12-24: qty 4

## 2016-12-24 MED ORDER — LIDOCAINE-EPINEPHRINE (PF) 2 %-1:200000 IJ SOLN
INTRAMUSCULAR | Status: AC
  Filled 2016-12-24: qty 20

## 2016-12-24 MED ORDER — LIDOCAINE HCL 1 % IJ SOLN
INTRAMUSCULAR | Status: AC
  Filled 2016-12-24: qty 20

## 2016-12-24 MED ORDER — IOPAMIDOL (ISOVUE-300) INJECTION 61%
INTRAVENOUS | Status: AC
  Administered 2016-12-24: 10 mL via INTRAVENOUS
  Filled 2016-12-24: qty 50

## 2016-12-24 MED ORDER — MIDAZOLAM HCL 2 MG/2ML IJ SOLN
INTRAMUSCULAR | Status: DC
Start: 2016-12-24 — End: 2016-12-25
  Filled 2016-12-24: qty 6

## 2016-12-24 MED ORDER — LIDOCAINE-EPINEPHRINE (PF) 2 %-1:200000 IJ SOLN
INTRAMUSCULAR | Status: AC | PRN
  Administered 2016-12-24: 20 mL

## 2016-12-24 MED ORDER — IOPAMIDOL (ISOVUE-300) INJECTION 61%
10.0000 mL | Freq: Once | INTRAVENOUS | Status: AC | PRN
  Administered 2016-12-24: 10 mL via INTRAVENOUS

## 2016-12-24 MED ORDER — HEPARIN SOD (PORK) LOCK FLUSH 100 UNIT/ML IV SOLN
INTRAVENOUS | Status: AC
  Filled 2016-12-24: qty 5

## 2016-12-24 MED ORDER — CEFAZOLIN SODIUM-DEXTROSE 2-4 GM/100ML-% IV SOLN
2.0000 g | INTRAVENOUS | Status: AC
  Administered 2016-12-24: 2 g via INTRAVENOUS

## 2016-12-24 MED ORDER — SODIUM CHLORIDE 0.9 % IV SOLN
INTRAVENOUS | Status: DC
  Administered 2016-12-24: 14:00:00 via INTRAVENOUS

## 2016-12-24 MED ORDER — HEPARIN SODIUM (PORCINE) 1000 UNIT/ML IJ SOLN
INTRAMUSCULAR | Status: AC | PRN
  Administered 2016-12-24: 500 [IU] via INTRAVENOUS

## 2016-12-24 MED ORDER — CEFAZOLIN SODIUM-DEXTROSE 2-4 GM/100ML-% IV SOLN
INTRAVENOUS | Status: AC
  Administered 2016-12-24: 2 g via INTRAVENOUS
  Filled 2016-12-24: qty 100

## 2016-12-24 MED ORDER — MIDAZOLAM HCL 2 MG/2ML IJ SOLN
INTRAMUSCULAR | Status: AC | PRN
  Administered 2016-12-24 (×2): 2 mg via INTRAVENOUS
  Administered 2016-12-24 (×2): 1 mg via INTRAVENOUS

## 2016-12-24 MED ORDER — FENTANYL CITRATE (PF) 100 MCG/2ML IJ SOLN
INTRAMUSCULAR | Status: AC | PRN
  Administered 2016-12-24 (×4): 50 ug via INTRAVENOUS

## 2016-12-24 NOTE — Sedation Documentation (Signed)
MD using more Lidocaine to "aid" with pts comfort

## 2016-12-24 NOTE — Sedation Documentation (Signed)
MD aware of sedation meds, pt crying and anxious; unable get pt to stop crying or relax

## 2016-12-24 NOTE — Progress Notes (Signed)
MD spoke with pt after case; pt very withdrawn, not speaking or answering any questions the nurse asks her. Pt will not make eye contact with staff; report given to [facility] RNl. Pt not verbally engaging with staff in [place]. Pt family at bedside.Multiple attempts by Radiology RN to help with pt's comfort and care.

## 2016-12-24 NOTE — Sedation Documentation (Signed)
Pt not closing eyes as advised by RN to aid with sedation, very anxious. RN encouraged her to close eyes and let meds "take effect".

## 2016-12-24 NOTE — Sedation Documentation (Signed)
Pt seems to be some 'calmer". Not crying as much and taking deep breaths as instructed by RN

## 2016-12-24 NOTE — Progress Notes (Signed)
Dr. Anselm Pancoast in room, talking with pt. And pt's family, Pt. To go home and take her prescribed pain medication at home.  Pt.instructed to tell her doctors  that she needs anesthesia with procedures, pt. And pt's family verbalized understanding.

## 2016-12-24 NOTE — H&P (Signed)
Chief Complaint: Patient was seen in consultation today for Port-A-Cath placement  Referring Physician(s):  Cammie Sickle, FNP  Supervising Physician: Markus Daft  Patient Status: [facility] - Out-pt  History of Present Illness: Dawn [name] is a 36 y.o. female with past medical history of sickle cell anemia who presents with complaint of poor IV access.  Patient has frequent sickle cell crisis requiring IV therapy, however this is often difficult to establish.  She has had a Port-A-Cath in the past but it was removed.   IR consulted for Port-A-Cath placement at the request of Cammie Sickle, [place].   She has been NPO.  She does not take blood thinners.  She has been in her usual state of health.  She was last assessed in the ED 12/22/16 for pain related to sickle cell crisis.   Past Medical History:  Diagnosis Date  . Blood transfusion   . Demand ischemia ([place]) 02/06/2016  . HCAP (healthcare-associated pneumonia) 05/19/2013  . 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 Pulmonary hypertension 02/06/2016   49 mmHg per echo on 02/06/16   . 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 Sickle cell anemia (HCC)   . Sickle cell disease ([place])   . Sickle cell disease, type S ([place])   . Vitamin B12 deficiency 07/07/2015    Past Surgical History:  Procedure Laterality Date  .  left knee ACL reconstruction    . CESAREAN SECTION     x 2  . CHOLECYSTECTOMY    . IR GENERIC HISTORICAL  07/17/2016   IR FLUORO GUIDE PORT INSERTION RIGHT 07/17/2016 Aletta Edouard, MD [facility]  . IR GENERIC HISTORICAL  07/17/2016   IR US GUIDE VASC ACCESS RIGHT 07/17/2016 Aletta Edouard, MD [facility]  . IR GENERIC HISTORICAL  10/12/2016   IR PTA VENOUS EXCEPT DIALYSIS CIRCUIT 10/12/2016 Arne Cleveland, MD [facility]  . IR GENERIC HISTORICAL  10/12/2016   IR US GUIDE VASC ACCESS RIGHT 10/12/2016 Arne Cleveland, MD [facility]  . IR GENERIC HISTORICAL  10/12/2016   IR VENO/EXT/UNI RIGHT 10/12/2016 Arne Cleveland,  MD [facility]  . PORT-A-CATH REMOVAL Left 07/29/2013   Procedure: REMOVAL PORT-A-CATH;  Surgeon: Scherry Ran, MD;  Location: [facility];  Service: General;  Laterality: Left;  . PORT-A-CATH REMOVAL Right 11/14/2016   Procedure: REMOVAL PORT-A-CATH;  Surgeon: Aviva Signs, MD;  Location: [facility];  Service: General;  Laterality: Right;  . PORTACATH PLACEMENT    . PORTACATH PLACEMENT Left 02/23/2013   Procedure: INSERTION PORT-A-CATH;  Surgeon: Donato Heinz, MD;  Location: [facility];  Service: General;  Laterality: Left;  . TEE WITHOUT CARDIOVERSION N/A 08/07/2013   Procedure: TRANSESOPHAGEAL ECHOCARDIOGRAM (TEE);  Surgeon: Arnoldo Lenis, MD;  Location: [facility];  Service: Cardiology;  Laterality: N/A;    Allergies: Desferal [deferoxamine]; Latex; Lisinopril; Tape; and Tylenol [acetaminophen]  Medications: Prior to Admission medications   Medication Sig Start Date End Date Taking? Authorizing Provider  aspirin EC 81 MG EC tablet Take 1 tablet (81 mg total) by mouth daily. 10/18/16  Yes Leana Gamer, MD  folic acid (FOLVITE) 1 MG tablet Take 1 tablet (1 mg total) by mouth daily. 03/14/16  Yes [name], FNP  gabapentin (NEURONTIN) 100 MG capsule Take 1 capsule (100 mg total) by mouth 3 (three) times daily. 12/13/16  Yes [name], FNP  hydroxyurea (HYDREA) 500 MG capsule Take 2 capsules (1,000 mg total) by mouth daily. May take with food to minimize GI side effects.  03/14/16  Yes [name], FNP  L-glutamine (ENDARI) 5 g PACK Powder Packet Take 5 g by mouth 2 (two) times daily. 12/13/16  Yes [name], FNP  loratadine (CLARITIN) 10 MG tablet Take 10 mg by mouth daily.   Yes Historical Provider, MD  morphine (MS CONTIN) 15 MG 12 hr tablet Take 1 tablet (15 mg total) by mouth every 12 (twelve) hours. 12/13/16  Yes [name], FNP  Multiple Vitamin (MULTIVITAMIN WITH MINERALS) TABS tablet Take 1 tablet by mouth daily.   Yes Historical Provider, MD    Oxycodone HCl 10 MG TABS Take 1 tablet (10 mg total) by mouth every 4 (four) hours as needed. Patient taking differently: Take 10 mg by mouth every 4 (four) hours as needed. pain 12/13/16  Yes [name], FNP  promethazine (PHENERGAN) 12.5 MG tablet Take 1 tablet (12.5 mg total) by mouth every 6 (six) hours as needed for nausea or vomiting. 12/13/16  Yes [name], FNP  Vitamin D, Ergocalciferol, (DRISDOL) 50000 units CAPS capsule Take 1 capsule (50,000 Units total) by mouth every 7 (seven) days. Takes on Sundays. 06/01/16  Yes Micheline Chapman, NP  levonorgestrel (MIRENA) 20 MCG/24HR IUD 1 each by Intrauterine route once.    Historical Provider, MD     Family History  Problem Relation Age of Onset  . Sickle cell trait Mother   . Sickle cell trait Father   . Hypertension Father   . Diabetes Father   . Sickle cell trait Sister   . Cancer Paternal Aunt     Breast    Social History   Social History  . Marital status: Divorced    Spouse name: N/A  . Number of children: N/A  . Years of education: N/A   Social History Main Topics  . Smoking status: Never Smoker  . Smokeless tobacco: Never Used  . Alcohol use No  . Drug use: No  . Sexual activity: Yes    Birth control/ protection: IUD   Other Topics Concern  . None   Social History Narrative  . None    Review of Systems  Constitutional: Negative for fatigue and fever.  Respiratory: Negative for cough and shortness of breath.   Cardiovascular: Negative for chest pain.  Psychiatric/Behavioral: Negative for behavioral problems and confusion.    Vital Signs: BP (!) 151/85 (BP Location: Right Arm)   Pulse 97   Temp 98.9 F (37.2 C) (Oral)   Resp 18   Ht 5\' 3"  (1.6 m)   Wt 167 lb (75.8 kg)   SpO2 98%   BMI 29.58 kg/m   Physical Exam  Constitutional: She is oriented to person, place, and time. She appears well-developed.  Eyes: Scleral icterus is present.  Cardiovascular: Normal rate, regular rhythm and  normal heart sounds.   Pulmonary/Chest: Effort normal and breath sounds normal. No respiratory distress.  Neurological: She is alert and oriented to person, place, and time.  Skin: Skin is warm and dry.  Visible scars on right and left chest from prior ports.   Psychiatric: She has a normal mood and affect. Her behavior is normal. Judgment and thought content normal.  Nursing note and vitals reviewed.   Mallampati Score:  MD Evaluation Airway: WNL Heart: WNL Abdomen: WNL Chest/ Lungs: WNL ASA  Classification: 3 Mallampati/Airway Score: Two  Imaging: Dg Chest 2 View  Result Date: 12/09/2016 CLINICAL DATA:  Chest pain for several weeks, worsening with sickle cell crisis. EXAM: CHEST  2 VIEW COMPARISON:  11/11/2016. FINDINGS: Borderline enlarged cardiac silhouette with an interval decrease in size. Mildly hyperexpanded lungs with mild diffuse peribronchial thickening and accentuation of the interstitial markings, without significant change. The right jugular porta catheter has been removed. No acute bony abnormality. IMPRESSION: No acute abnormality. Stable mild changes of COPD and chronic bronchitis. Electronically Signed   By: Claudie Revering M.D.   On: 12/09/2016 11:49    Labs:  CBC:  Recent Labs  12/15/16 0156 12/16/16 1016 12/19/16 1132 12/22/16 0819  WBC 8.8 6.0 7.3 8.0  HGB 8.5* 9.4* 9.9* 9.0*  HCT 22.6* 25.9* 27.1* 24.4*  PLT 205 229 263 260    COAGS:  Recent Labs  02/06/16 0529 07/17/16 1235 12/24/16 1306  INR 1.49 1.23 1.24  APTT 38*  --   --     BMP:  Recent Labs  12/15/16 0156 12/16/16 1016 12/19/16 1132 12/22/16 0819  NA 134* 136 134* 137  K 4.2 3.5 4.9 4.6  CL 106 106 104 109  CO2 23 23 22 23   GLUCOSE 74 98 117* 84  BUN 5* <5* 7 5*  CALCIUM 8.5* 8.8* 8.8* 8.6*  CREATININE <0.30* 0.53 <0.30* 0.40*  GFRNONAA NOT CALCULATED >60 NOT CALCULATED >60  GFRAA NOT CALCULATED >60 NOT CALCULATED >60    LIVER FUNCTION TESTS:  Recent Labs   12/09/16 0919 12/15/16 0156 12/16/16 1016 12/19/16 1132  BILITOT 12.4* 10.7* 12.8* 13.3*  AST 89* 127* 112* 142*  ALT 30 36 35 44  ALKPHOS 127* 127* 145* 150*  PROT 7.4 7.0 8.1 8.5*  ALBUMIN 2.6* 2.4* 2.8* 3.0*    TUMOR MARKERS: No results for input(s): AFPTM, CEA, CA199, CHROMGRNA in the last 8760 hours.  Assessment and Plan: Dawn [name] is a 36 y.o. female with past medical history of sickle cell anemia with frequent crisis who presents with complaint of poor IV access.  IR consulted for Port-A-Cath placement at the request of Cammie Sickle, [place].  She has had several Ports placed in the past. The most recent was in 06/2016 which was removed due to sepsis. Patient presents for procedure today.  She has been NPO. She does not take blood thinners.  Risks and benefits discussed with the patient including, but not limited to bleeding, infection, pneumothorax, or fibrin sheath development and need for additional procedures. All of the patient's questions were answered, patient is agreeable to proceed. Consent signed and in chart.  Thank you for this interesting consult.  I greatly enjoyed meeting Cesia L Soliz and look forward to participating in their care.  A copy of this report was sent to the requesting provider on this date.  Electronically Signed: Docia Barrier 12/24/2016, 1:54 PM   I spent a total of  15 Minutes   in face to face in clinical consultation, greater than 50% of which was counseling/coordinating care for sickle cell anemia

## 2016-12-24 NOTE — Sedation Documentation (Addendum)
Pt crying, having sickle cell pain in arms, legs. MD aware of med amounts, advises RN to wait at this time before more meds. MD asked pt if she "wanted to stop" or could we help her "get thru it". Pt kept asking "Why arent meds working". RN at bedside; pt states "ok". Staff using methods to calm her and help her do deep breathing to aid with sedation. RN encouraged pt to keep eyes closed- b/c that will aid sedation

## 2016-12-24 NOTE — Sedation Documentation (Signed)
Successful R chest port placement

## 2016-12-24 NOTE — Discharge Instructions (Signed)
Implanted Port Home Guide °An implanted port is a type of central line that is placed under the skin. Central lines are used to provide IV access when treatment or nutrition needs to be given through a person’s veins. Implanted ports are used for long-term IV access. An implanted port may be placed because: °· You need IV medicine that would be irritating to the small veins in your hands or arms. °· You need long-term IV medicines, such as antibiotics. °· You need IV nutrition for a long period. °· You need frequent blood draws for lab tests. °· You need dialysis. °Implanted ports are usually placed in the chest area, but they can also be placed in the upper arm, the abdomen, or the leg. An implanted port has two main parts: °· Reservoir. The reservoir is round and will appear as a small, raised area under your skin. The reservoir is the part where a needle is inserted to give medicines or draw blood. °· Catheter. The catheter is a thin, flexible tube that extends from the reservoir. The catheter is placed into a large vein. Medicine that is inserted into the reservoir goes into the catheter and then into the vein. °How will I care for my incision site? °Do not get the incision site wet. Bathe or shower as directed by your health care provider. °How is my port accessed? °Special steps must be taken to access the port: °· Before the port is accessed, a numbing cream can be placed on the skin. This helps numb the skin over the port site. °· Your health care provider uses a sterile technique to access the port. °¨ Your health care provider must put on a mask and sterile gloves. °¨ The skin over your port is cleaned carefully with an antiseptic and allowed to dry. °¨ The port is gently pinched between sterile gloves, and a needle is inserted into the port. °· Only "non-coring" port needles should be used to access the port. Once the port is accessed, a blood return should be checked. This helps ensure that the port is  in the vein and is not clogged. °· If your port needs to remain accessed for a constant infusion, a clear (transparent) bandage will be placed over the needle site. The bandage and needle will need to be changed every week, or as directed by your health care provider. °· Keep the bandage covering the needle clean and dry. Do not get it wet. Follow your health care provider’s instructions on how to take a shower or bath while the port is accessed. °· If your port does not need to stay accessed, no bandage is needed over the port. °What is flushing? °Flushing helps keep the port from getting clogged. Follow your health care provider’s instructions on how and when to flush the port. Ports are usually flushed with saline solution or a medicine called heparin. The need for flushing will depend on how the port is used. °· If the port is used for intermittent medicines or blood draws, the port will need to be flushed: °¨ After medicines have been given. °¨ After blood has been drawn. °¨ As part of routine maintenance. °· If a constant infusion is running, the port may not need to be flushed. °How long will my port stay implanted? °The port can stay in for as long as your health care provider thinks it is needed. When it is time for the port to come out, surgery will be done to remove it.   The procedure is similar to the one performed when the port was put in. °When should I seek immediate medical care? °When you have an implanted port, you should seek immediate medical care if: °· You notice a bad smell coming from the incision site. °· You have swelling, redness, or drainage at the incision site. °· You have more swelling or pain at the port site or the surrounding area. °· You have a fever that is not controlled with medicine. °This information is not intended to replace advice given to you by your health care provider. Make sure you discuss any questions you have with your health care provider. °Document Released:  09/10/2005 Document Revised: 02/16/2016 Document Reviewed: 05/18/2013 °Elsevier Interactive Patient Education © 2017 [employer] °Implanted Port Insertion, Care After °This sheet gives you information about how to care for yourself after your procedure. Your health care provider may also give you more specific instructions. If you have problems or questions, contact your health care provider. °What can I expect after the procedure? °After your procedure, it is common to have: °· Discomfort at the port insertion site. °· Bruising on the skin over the port. This should improve over 3-4 days. °Follow these instructions at home: °Port care  °· After your port is placed, you will get a manufacturer's information card. The card has information about your port. Keep this card with you at all times. °· Take care of the port as told by your health care provider. Ask your health care provider if you or a family member can get training for taking care of the port at home. A home health care nurse may also take care of the port. °· Make sure to remember what type of port you have. °Incision care  °· Follow instructions from your health care provider about how to take care of your port insertion site. Make sure you: °¨ Wash your hands with soap and water before you change your bandage (dressing). If soap and water are not available, use hand sanitizer. °¨ Change your dressing as told by your health care provider. °¨ Leave stitches (sutures), skin glue, or adhesive strips in place. These skin closures may need to stay in place for 2 weeks or longer. If adhesive strip edges start to loosen and curl up, you may trim the loose edges. Do not remove adhesive strips completely unless your health care provider tells you to do that. °· Check your port insertion site every day for signs of infection. Check for: °¨ More redness, swelling, or pain. °¨ More fluid or blood. °¨ Warmth. °¨ Pus or a bad smell. °General instructions  °· Do not  take baths, swim, or use a hot tub until your health care provider approves. °· Do not lift anything that is heavier than 10 lb (4.5 kg) for a week, or as told by your health care provider. °· Ask your health care provider when it is okay to: °¨ Return to work or school. °¨ Resume usual physical activities or sports. °· Do not drive for 24 hours if you were given a medicine to help you relax (sedative). °· Take over-the-counter and prescription medicines only as told by your health care provider. °· Wear a medical alert bracelet in case of an emergency. This will tell any health care providers that you have a port. °· Keep all follow-up visits as told by your health care provider. This is important. °Contact a health care provider if: °· You cannot flush your port   with saline as directed, or you cannot draw blood from the port. °· You have a fever or chills. °· You have more redness, swelling, or pain around your port insertion site. °· You have more fluid or blood coming from your port insertion site. °· Your port insertion site feels warm to the touch. °· You have pus or a bad smell coming from the port insertion site. °Get help right away if: °· You have chest pain or shortness of breath. °· You have bleeding from your port that you cannot control. °Summary °· Take care of the port as told by your health care provider. °· Change your dressing as told by your health care provider. °· Keep all follow-up visits as told by your health care provider. °This information is not intended to replace advice given to you by your health care provider. Make sure you discuss any questions you have with your health care provider. °Document Released: 07/01/2013 Document Revised: 08/01/2016 Document Reviewed: 08/01/2016 °Elsevier Interactive Patient Education © 2017 [employer] °Moderate Conscious Sedation, Adult, Care After °These instructions provide you with information about caring for yourself after your procedure. Your  health care provider may also give you more specific instructions. Your treatment has been planned according to current medical practices, but problems sometimes occur. Call your health care provider if you have any problems or questions after your procedure. °What can I expect after the procedure? °After your procedure, it is common: °· To feel sleepy for several hours. °· To feel clumsy and have poor balance for several hours. °· To have poor judgment for several hours. °· To vomit if you eat too soon. °Follow these instructions at home: °For at least 24 hours after the procedure:  ° °· Do not: °¨ Participate in activities where you could fall or become injured. °¨ Drive. °¨ Use heavy machinery. °¨ Drink alcohol. °¨ Take sleeping pills or medicines that cause drowsiness. °¨ Make important decisions or sign legal documents. °¨ Take care of children on your own. °· Rest. °Eating and drinking  °· Follow the diet recommended by your health care provider. °· If you vomit: °¨ Drink water, juice, or soup when you can drink without vomiting. °¨ Make sure you have little or no nausea before eating solid foods. °General instructions  °· Have a responsible adult stay with you until you are awake and alert. °· Take over-the-counter and prescription medicines only as told by your health care provider. °· If you smoke, do not smoke without supervision. °· Keep all follow-up visits as told by your health care provider. This is important. °Contact a health care provider if: °· You keep feeling nauseous or you keep vomiting. °· You feel light-headed. °· You develop a rash. °· You have a fever. °Get help right away if: °· You have trouble breathing. °This information is not intended to replace advice given to you by your health care provider. Make sure you discuss any questions you have with your health care provider. °Document Released: 07/01/2013 Document Revised: 02/13/2016 Document Reviewed: 12/31/2015 °Elsevier Interactive  Patient Education © 2017 [employer] ° °

## 2016-12-24 NOTE — Procedures (Signed)
  Pre-operative Diagnosis: Sickle cell and poor venous access       Post-operative Diagnosis: : Sickle cell and poor venous access   Indications: Poor venous access  Procedure: Portacath placement  Findings: Right IJ port.  Tip at SVC/RA junction.  Complications: None     EBL: Minimal  Plan: Discharge to home.  Patient did not tolerate case well due to inability to adequately sedate.  In future, patient will need anesthesia assistance.

## 2016-12-27 ENCOUNTER — Emergency Department (HOSPITAL_COMMUNITY)
Admission: EM | Admit: 2016-12-27 | Discharge: 2016-12-27 | Disposition: A | Discharge status: Home / Self Care | Payer: Medicare Other | Attending: Emergency Medicine | Admitting: Emergency Medicine

## 2016-12-27 ENCOUNTER — Encounter (HOSPITAL_COMMUNITY): Payer: Self-pay

## 2016-12-27 DIAGNOSIS — A5901 Trichomonal vulvovaginitis: Secondary | ICD-10-CM | POA: Insufficient documentation

## 2016-12-27 DIAGNOSIS — N3001 Acute cystitis with hematuria: Secondary | ICD-10-CM | POA: Insufficient documentation

## 2016-12-27 DIAGNOSIS — Z7982 Long term (current) use of aspirin: Secondary | ICD-10-CM | POA: Insufficient documentation

## 2016-12-27 DIAGNOSIS — E876 Hypokalemia: Secondary | ICD-10-CM

## 2016-12-27 DIAGNOSIS — A599 Trichomoniasis, unspecified: Secondary | ICD-10-CM

## 2016-12-27 DIAGNOSIS — D5700 Hb-SS disease with crisis, unspecified: Secondary | ICD-10-CM | POA: Diagnosis not present

## 2016-12-27 DIAGNOSIS — Z79899 Other long term (current) drug therapy: Secondary | ICD-10-CM | POA: Insufficient documentation

## 2016-12-27 LAB — CBC WITH DIFFERENTIAL/PLATELET
BASOS ABS: 0 10*3/uL (ref 0.0–0.1)
BASOS PCT: 0 %
EOS ABS: 0.3 10*3/uL (ref 0.0–0.7)
Eosinophils Relative: 4 %
HCT: 22.3 % — ABNORMAL LOW (ref 36.0–46.0)
Hemoglobin: 8.1 g/dL — ABNORMAL LOW (ref 12.0–15.0)
Lymphocytes Relative: 20 %
Lymphs Abs: 1.5 10*3/uL (ref 0.7–4.0)
MCH: 35.5 pg — ABNORMAL HIGH (ref 26.0–34.0)
MCHC: 36.3 g/dL — ABNORMAL HIGH (ref 30.0–36.0)
MCV: 97.8 fL (ref 78.0–100.0)
Monocytes Absolute: 1 10*3/uL (ref 0.1–1.0)
Monocytes Relative: 14 %
NEUTROS ABS: 4.7 10*3/uL (ref 1.7–7.7)
NEUTROS PCT: 63 %
PLATELETS: 224 10*3/uL (ref 150–400)
RBC: 2.28 MIL/uL — AB (ref 3.87–5.11)
RDW: 22.1 % — ABNORMAL HIGH (ref 11.5–15.5)
WBC: 7.4 10*3/uL (ref 4.0–10.5)

## 2016-12-27 LAB — COMPREHENSIVE METABOLIC PANEL
ALBUMIN: 2.4 g/dL — AB (ref 3.5–5.0)
ALT: 29 U/L (ref 14–54)
AST: 86 U/L — AB (ref 15–41)
Alkaline Phosphatase: 116 U/L (ref 38–126)
Anion gap: 6 (ref 5–15)
BUN: 5 mg/dL — AB (ref 6–20)
CO2: 24 mmol/L (ref 22–32)
CREATININE: 0.51 mg/dL (ref 0.44–1.00)
Calcium: 8.2 mg/dL — ABNORMAL LOW (ref 8.9–10.3)
Chloride: 105 mmol/L (ref 101–111)
GFR calc non Af Amer: >60 mL/min (ref >60)
GLUCOSE: 133 mg/dL — AB (ref 65–99)
Potassium: 2.8 mmol/L — ABNORMAL LOW (ref 3.5–5.1)
Sodium: 135 mmol/L (ref 135–145)
Total Bilirubin: 13.3 mg/dL — ABNORMAL HIGH (ref 0.3–1.2)
Total Protein: 7 g/dL (ref 6.5–8.1)

## 2016-12-27 LAB — PREGNANCY, URINE: Preg Test, Ur: NEGATIVE

## 2016-12-27 LAB — URINALYSIS, MICROSCOPIC (REFLEX)

## 2016-12-27 LAB — URINALYSIS, ROUTINE W REFLEX MICROSCOPIC
Glucose, UA: 100 mg/dL — AB
Ketones, ur: NEGATIVE mg/dL
NITRITE: NEGATIVE
PH: 6.5 (ref 5.0–8.0)
Protein, ur: 100 mg/dL — AB
SPECIFIC GRAVITY, URINE: 1.01 (ref 1.005–1.030)

## 2016-12-27 LAB — RETICULOCYTES
RBC.: 2.28 MIL/uL — AB (ref 3.87–5.11)
RETIC CT PCT: 21.7 % — AB (ref 0.4–3.1)
Retic Count, Absolute: 494.8 10*3/uL — ABNORMAL HIGH (ref 19.0–186.0)

## 2016-12-27 MED ORDER — HEPARIN SOD (PORK) LOCK FLUSH 100 UNIT/ML IV SOLN
500.0000 [IU] | Freq: Once | INTRAVENOUS | Status: AC
  Administered 2016-12-27: 500 [IU]
  Filled 2016-12-27: qty 5

## 2016-12-27 MED ORDER — POTASSIUM CHLORIDE CRYS ER 20 MEQ PO TBCR
40.0000 meq | EXTENDED_RELEASE_TABLET | Freq: Once | ORAL | Status: AC
  Administered 2016-12-27: 40 meq via ORAL
  Filled 2016-12-27: qty 2

## 2016-12-27 MED ORDER — SULFAMETHOXAZOLE-TRIMETHOPRIM 800-160 MG PO TABS: 1.0000 | ORAL_TABLET | Freq: Two times a day (BID) | ORAL | 0 refills | Status: DC

## 2016-12-27 MED ORDER — MORPHINE SULFATE (PF) 4 MG/ML IV SOLN
10.0000 mg | Freq: Once | INTRAVENOUS | Status: AC
  Administered 2016-12-27: 10 mg via INTRAVENOUS
  Filled 2016-12-27: qty 3

## 2016-12-27 MED ORDER — METRONIDAZOLE 500 MG PO TABS
500.0000 mg | ORAL_TABLET | Freq: Once | ORAL | Status: AC
  Administered 2016-12-27: 500 mg via ORAL
  Filled 2016-12-27: qty 1

## 2016-12-27 MED ORDER — METRONIDAZOLE 500 MG PO TABS: 500.0000 mg | ORAL_TABLET | Freq: Two times a day (BID) | ORAL | 0 refills | Status: DC

## 2016-12-27 MED ORDER — POTASSIUM CHLORIDE ER 10 MEQ PO TBCR: 10.0000 meq | EXTENDED_RELEASE_TABLET | Freq: Every day | ORAL | 0 refills | Status: AC

## 2016-12-27 NOTE — ED Provider Notes (Signed)
[facility] DEPT Provider Note   CSN: [id number] Arrival date & time: 12/27/16  1025     History   Chief Complaint Chief Complaint  Patient presents with  . Sickle Cell Pain Crisis    HPI Dawn [name] is a 36 y.o. female.  HPI  The pt is a 36 y/o female - frequent ED utilizer but also with hx of SS disease - she has been seen 13 times in the last month - she had a port placed 3 days ago - she has had recurrent pain in her bilateral forearms, Her bilateral lower extremities below the knees and in her lower back which seems to be the location of her frequent flareups. She has also recently been transfused as recently as 2 months ago where she required 2 units of blood. The patient admits that she has had an increasing number of ER visits and she is unsure why though she thinks it may be related to cold temperatures. She has been eating and drinking, she is not nauseated or having any fevers and has no chest pain or shortness of breath. The pain gets worse with using the arm of the legs, there is worse with palpation, there is no associated swelling or redness of the arms or the legs. She has been taking her long-acting morphine twice a day at 10 mg each dose and has been using her oxycodone every 4 hours at 5 mg per dose. She states this is inadequately controlling her pain.  Past Medical History:  Diagnosis Date  . Blood transfusion   . Demand ischemia ([place]) 02/06/2016  . HCAP (healthcare-associated pneumonia) 05/19/2013  . 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 Pulmonary hypertension 02/06/2016   49 mmHg per echo on 02/06/16   . 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 Sickle cell anemia (HCC)   . Sickle cell disease ([place])   . 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 Vitamin B12 deficiency 07/07/2015    Patient Active Problem List   Diagnosis Date Noted  . Sickle cell anemia with pain ([place]) 11/06/2016  . Syncope 10/17/2016  . Dizziness   . Hypomagnesemia   . Swelling of right  upper extremity   . 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 Sickle cell crisis ([place]) 10/03/2016  . Positive D dimer   . Pulmonary hypertension 02/16/2016  . 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 Anemia of chronic disease   . Chronic pain syndrome 10/14/2015  . 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 Vitamin B12 deficiency 07/07/2015  . Thrombocytopenia ([place]) 07/06/2015  . Hyperbilirubinemia 09/11/2014  . Macrocytosis 06/13/2014  . Vitamin D deficiency 11/23/2013  . 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 Hypokalemia 03/28/2012  . 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 Elevated LFTs 04/27/2011  . Sickle cell disease ([place]) 04/26/2011    Past Surgical History:  Procedure Laterality Date  .  left knee ACL reconstruction    . CESAREAN SECTION     x 2  . CHOLECYSTECTOMY    . IR FLUORO GUIDE PORT INSERTION RIGHT  12/24/2016  . IR GENERIC HISTORICAL  07/17/2016   IR FLUORO GUIDE PORT INSERTION RIGHT 07/17/2016 Aletta Edouard, MD [facility]  . IR GENERIC HISTORICAL  07/17/2016   IR US GUIDE VASC ACCESS RIGHT 07/17/2016 Aletta Edouard, MD [facility]  . IR GENERIC HISTORICAL  10/12/2016   IR PTA VENOUS EXCEPT DIALYSIS CIRCUIT 10/12/2016 Arne Cleveland, MD [facility]  . IR GENERIC HISTORICAL  10/12/2016   IR US GUIDE VASC ACCESS RIGHT 10/12/2016 Arne Cleveland, MD [facility]  . IR GENERIC HISTORICAL  10/12/2016   IR VENO/EXT/UNI RIGHT 10/12/2016 Arne Cleveland, MD [facility]  . IR US GUIDE VASC ACCESS RIGHT  12/24/2016  . PORT-A-CATH REMOVAL Left 07/29/2013   Procedure: REMOVAL PORT-A-CATH;  Surgeon: Scherry Ran, MD;  Location: [facility];  Service: General;  Laterality: Left;  . PORT-A-CATH REMOVAL Right 11/14/2016   Procedure: REMOVAL PORT-A-CATH;  Surgeon: Aviva Signs, MD;  Location: [facility];  Service: General;  Laterality: Right;  . PORTACATH PLACEMENT    . PORTACATH PLACEMENT Left 02/23/2013   Procedure:  INSERTION PORT-A-CATH;  Surgeon: Donato Heinz, MD;  Location: [facility];  Service: General;  Laterality: Left;  . TEE WITHOUT CARDIOVERSION N/A 08/07/2013   Procedure: TRANSESOPHAGEAL ECHOCARDIOGRAM (TEE);  Surgeon: Arnoldo Lenis, MD;  [facility];  Service: Cardiology;  Laterality: N/A;    OB History    Gravida Para Term Preterm AB Living   1 1   1   1    SAB TAB Ectopic Multiple Live Births                   Home Medications    Prior to Admission medications   Medication Sig Start Date End Date Taking? Authorizing Provider  aspirin EC 81 MG EC tablet Take 1 tablet (81 mg total) by mouth daily. 10/18/16  Yes Leana Gamer, MD  folic acid (FOLVITE) 1 MG tablet Take 1 tablet (1 mg total) by mouth daily. 03/14/16  Yes [name], FNP  gabapentin (NEURONTIN) 100 MG capsule Take 1 capsule (100 mg total) by mouth 3 (three) times daily. 12/13/16  Yes [name], FNP  hydroxyurea (HYDREA) 500 MG capsule Take 2 capsules (1,000 mg total) by mouth daily. May take with food to minimize GI side effects. 03/14/16  Yes [name], FNP  L-glutamine (ENDARI) 5 g PACK Powder Packet Take 5 g by mouth 2 (two) times daily. 12/13/16  Yes [name], FNP  levonorgestrel (MIRENA) 20 MCG/24HR IUD 1 each by Intrauterine route once.   Yes Historical Provider, MD  loratadine (CLARITIN) 10 MG tablet Take 10 mg by mouth daily.   Yes Historical Provider, MD  morphine (MS CONTIN) 15 MG 12 hr tablet Take 1 tablet (15 mg total) by mouth every 12 (twelve) hours. 12/13/16  Yes [name], FNP  Multiple Vitamin (MULTIVITAMIN WITH MINERALS) TABS tablet Take 1 tablet by mouth daily.   Yes Historical Provider, MD  Oxycodone HCl 10 MG TABS Take 1 tablet (10 mg total) by mouth every 4 (four) hours as needed. Patient taking differently: Take 10 mg by mouth every 4 (four) hours as needed. pain 12/13/16  Yes [name], FNP  promethazine (PHENERGAN) 12.5 MG tablet Take 1 tablet  (12.5 mg total) by mouth every 6 (six) hours as needed for nausea or vomiting. 12/13/16  Yes [name], FNP  Vitamin D, Ergocalciferol, (DRISDOL) 50000 units CAPS capsule Take 1 capsule (50,000 Units total) by mouth every 7 (seven) days. Takes on Sundays. 06/01/16  Yes Micheline Chapman, NP  metroNIDAZOLE (FLAGYL) 500 MG tablet Take 1 tablet (500 mg total) by mouth 2 (two) times daily. 12/27/16   Noemi Chapel, MD  potassium chloride (K-DUR) 10 MEQ tablet Take 1 tablet (10 mEq total) by mouth daily. 12/27/16   Noemi Chapel, MD  sulfamethoxazole-trimethoprim (BACTRIM DS,SEPTRA DS) 800-160 MG tablet Take  1 tablet by mouth 2 (two) times daily. 12/27/16 01/03/17  Noemi Chapel, MD    Family History Family History  Problem Relation Age of Onset  . Sickle cell trait Mother   . Sickle cell trait Father   . Hypertension Father   . Diabetes Father   . Sickle cell trait Sister   . Cancer Paternal Aunt     Breast    Social History Social History  Substance Use Topics  . Smoking status: Never Smoker  . Smokeless tobacco: Never Used  . Alcohol use No     Allergies   Desferal [deferoxamine]; Latex; Lisinopril; Tape; and Tylenol [acetaminophen]   Review of Systems Review of Systems  All other systems reviewed and are negative.    Physical Exam Updated Vital Signs BP (!) 143/90   Pulse 97   Temp 98.3 F (36.8 C) (Oral)   Resp 18   SpO2 97%   Physical Exam  Constitutional: She appears well-developed and well-nourished. No distress.  HENT:  Head: Normocephalic and atraumatic.  Mouth/Throat: Oropharynx is clear and moist. No oropharyngeal exudate.  Eyes: EOM are normal. Pupils are equal, round, and reactive to light. Right eye exhibits no discharge. Left eye exhibits no discharge. Scleral icterus is present.  Neck: Normal range of motion. Neck supple. No JVD present. No thyromegaly present.  Cardiovascular: Normal rate, regular rhythm, normal heart sounds and intact distal pulses.  Exam  reveals no gallop and no friction rub.   No murmur heard. There is no tachycardia, she has strong pulses at the radial arteries bilaterally  Pulmonary/Chest: Effort normal and breath sounds normal. No respiratory distress. She has no wheezes. She has no rales.  The lungs are totally clear, the port in the right upper chest appears clear, there is no redness or tenderness around this area  Abdominal: Soft. Bowel sounds are normal. She exhibits no distension and no mass. There is no tenderness.  Musculoskeletal: Normal range of motion. She exhibits no edema or tenderness.  There is tenderness to palpation over the bilateral forearms though there is no asymmetry, compartments are soft. There is bilateral tenderness over the bilateral lower extremities below the knee however again there is very soft compartments and supple joints diffusely  Lymphadenopathy:    She has no cervical adenopathy.  Neurological: She is alert. Coordination normal.  Skin: Skin is warm and dry. No rash noted. No erythema.  Psychiatric: She has a normal mood and affect. Her behavior is normal.  Nursing note and vitals reviewed.    ED Treatments / Results  Labs (all labs ordered are listed, but only abnormal results are displayed) Labs Reviewed  RETICULOCYTES - Abnormal; Notable for the following:       Result Value   Retic Ct Pct 21.7 (*)    RBC. 2.28 (*)    Retic Count, Manual 494.8 (*)    All other components within normal limits  CBC WITH DIFFERENTIAL/PLATELET - Abnormal; Notable for the following:    RBC 2.28 (*)    Hemoglobin 8.1 (*)    HCT 22.3 (*)    MCH 35.5 (*)    MCHC 36.3 (*)    RDW 22.1 (*)    All other components within normal limits  COMPREHENSIVE METABOLIC PANEL - Abnormal; Notable for the following:    Potassium 2.8 (*)    Glucose, Bld 133 (*)    BUN 5 (*)    Calcium 8.2 (*)    Albumin 2.4 (*)    AST  86 (*)    Total Bilirubin 13.3 (*)    All other components within normal limits    URINALYSIS, ROUTINE W REFLEX MICROSCOPIC - Abnormal; Notable for the following:    Color, Urine AMBER (*)    Glucose, UA 100 (*)    Hgb urine dipstick LARGE (*)    Bilirubin Urine LARGE (*)    Protein, ur 100 (*)    Leukocytes, UA MODERATE (*)    All other components within normal limits  URINALYSIS, MICROSCOPIC (REFLEX) - Abnormal; Notable for the following:    Bacteria, UA MANY (*)    Squamous Epithelial / LPF TOO NUMEROUS TO COUNT (*)    All other components within normal limits  URINE CULTURE  PREGNANCY, URINE    Radiology No results found.  Procedures Procedures (including critical care time)  Medications Ordered in ED Medications  metroNIDAZOLE (FLAGYL) tablet 500 mg (not administered)  morphine 4 MG/ML injection 10 mg (not administered)  potassium chloride SA (K-DUR,KLOR-CON) CR tablet 40 mEq (not administered)  morphine 4 MG/ML injection 10 mg (10 mg Intravenous Given 12/27/16 0925)     Initial Impression / Assessment and Plan / ED Course  I have reviewed the triage vital signs and the nursing notes.  Pertinent labs & imaging results that were available during my care of the patient were reviewed by me and considered in my medical decision making (see chart for details).   The patient does not appear to be in distress but does complain of significant pain. She rates this at 10 out of 10 at this time. We will obtain a urinalysis as well as lab work and I will discuss her care with the sickle cell physician on call.  I discussed the pt's care with her including all findings (STD's as well) and with her PCP at the sickle cell center,  They recommend no change in home meds, close f/u Labs reviewed Trich Stable anemia > 8 K low - needs replacement Pt expressed understanding.   Final Clinical Impressions(s) / ED Diagnoses   Final diagnoses:  Sickle cell pain crisis ([place])  Acute cystitis with hematuria  Trichomonas vaginalis infection  Hypokalemia    New  Prescriptions New Prescriptions   METRONIDAZOLE (FLAGYL) 500 MG TABLET    Take 1 tablet (500 mg total) by mouth 2 (two) times daily.   POTASSIUM CHLORIDE (K-DUR) 10 MEQ TABLET    Take 1 tablet (10 mEq total) by mouth daily.   SULFAMETHOXAZOLE-TRIMETHOPRIM (BACTRIM DS,SEPTRA DS) 800-160 MG TABLET    Take 1 tablet by mouth 2 (two) times daily.     Noemi Chapel, MD 12/28/16 [phone]

## 2016-12-27 NOTE — ED Triage Notes (Signed)
Pt. Is complaining of Sickle Cell pain. Pain started Tuesday. Pt. Had a port put in on Monday and thinks the trauma from that started this crisis. Tried to take her home medicines, but had no relief.

## 2016-12-27 NOTE — Discharge Instructions (Signed)
I discussed your care with your sickle cell doctor who will see you in follow up in the office  You have Trichomonas - this is an STD - you need to tell your partner and have them treated as well.  Pelvic Infection  If you have been diagnosed with a pelvic infection such as a sexually transmitted disease, you will need to be treated with antibiotics. Please take the medicines as prescribed. Some of these tests do not come back for 1-2 days in which case if they turn positive you will receive a phone call to let you know. If you are contacted and do have an infection consistent with a sexually transmitted disease, then you will need to tell any and all sexual partners that you have had in the last 6 months no so that they can be tested and treated as well. If you should develop severe or worsening pain in your abdomen or the pelvis or develop severe fevers,nausea or vomiting that prevent you from taking your medications, return to the emergency department immediately. Otherwise contact your local physician or county health department for a follow up appointment to complete STD testing including HIV and syphilis.  See the list of phone numbers below.  RESOURCE GUIDE  Dental Problems  Patients with Medicaid: [place] [employer] Phone:  [phone]                                                  Phone:  [phone]  If unable to pay or uninsured, contact:  [facility] or [facility]. to become qualified for the adult dental clinic.  Chronic Pain Problems Contact [facility]  [phone] Patients need to be referred by their primary care doctor.  Insufficient Money for Medicine Contact [employer]:  call "211" or [employer] [phone].  No Primary Care Doctor Call [facility]  [phone] Other agencies that provide inexpensive  medical care    [place]  [phone]    [facility]  [place]  [phone]    [facility]  [phone]    [facility]  [place]  [place]  [phone] [place]   [phone] (emergency services [phone])  Substance Abuse [facility]  [phone] [facility] [phone] [facility] [place] [phone] [place] [phone] Residential & Outpatient Substance Abuse Program  [phone]  Abuse/Neglect [place] [phone] [place] [phone] (After Hours)  Emergency [place] [phone]  [place] [facility] [place] [phone] [place] [phone]  MRSA Hotline #:   305-577-8770    [facility] [place]  [facility] Dept. [address]      [place] Phone:  [phone]                                   Phone:  [phone]                 Phone:  [place] Phone:  [place] [phone] (After Hours)

## 2016-12-28 LAB — URINE CULTURE: Culture: <10000 — AB

## 2017-01-02 ENCOUNTER — Emergency Department (HOSPITAL_COMMUNITY)
Admission: EM | Admit: 2017-01-02 | Discharge: 2017-01-02 | Disposition: A | Discharge status: Home / Self Care | Payer: Medicare Other | Source: Home / Self Care | Attending: Emergency Medicine | Admitting: Emergency Medicine

## 2017-01-02 ENCOUNTER — Encounter (HOSPITAL_COMMUNITY): Payer: Self-pay | Admitting: *Deleted

## 2017-01-02 ENCOUNTER — Encounter (HOSPITAL_COMMUNITY): Payer: Self-pay | Admitting: Emergency Medicine

## 2017-01-02 ENCOUNTER — Inpatient Hospital Stay (HOSPITAL_COMMUNITY)
Admission: EM | Admit: 2017-01-02 | Discharge: 2017-01-25 | DRG: 982 | Disposition: A | Discharge status: Home Health | Payer: Medicare Other | Attending: Family Medicine | Admitting: Family Medicine

## 2017-01-02 DIAGNOSIS — D696 Thrombocytopenia, unspecified: Secondary | ICD-10-CM | POA: Diagnosis not present

## 2017-01-02 DIAGNOSIS — R509 Fever, unspecified: Secondary | ICD-10-CM | POA: Diagnosis not present

## 2017-01-02 DIAGNOSIS — Z79899 Other long term (current) drug therapy: Secondary | ICD-10-CM | POA: Insufficient documentation

## 2017-01-02 DIAGNOSIS — R601 Generalized edema: Secondary | ICD-10-CM | POA: Diagnosis not present

## 2017-01-02 DIAGNOSIS — N179 Acute kidney failure, unspecified: Secondary | ICD-10-CM | POA: Diagnosis present

## 2017-01-02 DIAGNOSIS — R7989 Other specified abnormal findings of blood chemistry: Secondary | ICD-10-CM | POA: Diagnosis present

## 2017-01-02 DIAGNOSIS — E876 Hypokalemia: Secondary | ICD-10-CM | POA: Diagnosis present

## 2017-01-02 DIAGNOSIS — Z6830 Body mass index (BMI) 30.0-30.9, adult: Secondary | ICD-10-CM

## 2017-01-02 DIAGNOSIS — R0602 Shortness of breath: Secondary | ICD-10-CM

## 2017-01-02 DIAGNOSIS — Z792 Long term (current) use of antibiotics: Secondary | ICD-10-CM

## 2017-01-02 DIAGNOSIS — Z7982 Long term (current) use of aspirin: Secondary | ICD-10-CM

## 2017-01-02 DIAGNOSIS — R112 Nausea with vomiting, unspecified: Secondary | ICD-10-CM | POA: Diagnosis not present

## 2017-01-02 DIAGNOSIS — Y828 Other medical devices associated with adverse incidents: Secondary | ICD-10-CM | POA: Diagnosis present

## 2017-01-02 DIAGNOSIS — T829XXA Unspecified complication of cardiac and vascular prosthetic device, implant and graft, initial encounter: Secondary | ICD-10-CM | POA: Diagnosis present

## 2017-01-02 DIAGNOSIS — M79606 Pain in leg, unspecified: Secondary | ICD-10-CM | POA: Diagnosis present

## 2017-01-02 DIAGNOSIS — R609 Edema, unspecified: Secondary | ICD-10-CM | POA: Diagnosis not present

## 2017-01-02 DIAGNOSIS — E8770 Fluid overload, unspecified: Secondary | ICD-10-CM | POA: Diagnosis present

## 2017-01-02 DIAGNOSIS — D72829 Elevated white blood cell count, unspecified: Secondary | ICD-10-CM | POA: Diagnosis not present

## 2017-01-02 DIAGNOSIS — Z888 Allergy status to other drugs, medicaments and biological substances status: Secondary | ICD-10-CM

## 2017-01-02 DIAGNOSIS — E669 Obesity, unspecified: Secondary | ICD-10-CM | POA: Diagnosis present

## 2017-01-02 DIAGNOSIS — M545 Low back pain: Secondary | ICD-10-CM | POA: Diagnosis present

## 2017-01-02 DIAGNOSIS — Y848 Other medical procedures as the cause of abnormal reaction of the patient, or of later complication, without mention of misadventure at the time of the procedure: Secondary | ICD-10-CM | POA: Diagnosis present

## 2017-01-02 DIAGNOSIS — D638 Anemia in other chronic diseases classified elsewhere: Secondary | ICD-10-CM | POA: Diagnosis present

## 2017-01-02 DIAGNOSIS — D5700 Hb-SS disease with crisis, unspecified: Principal | ICD-10-CM | POA: Diagnosis present

## 2017-01-02 DIAGNOSIS — Z91040 Latex allergy status: Secondary | ICD-10-CM

## 2017-01-02 DIAGNOSIS — E8809 Other disorders of plasma-protein metabolism, not elsewhere classified: Secondary | ICD-10-CM | POA: Diagnosis present

## 2017-01-02 DIAGNOSIS — R945 Abnormal results of liver function studies: Secondary | ICD-10-CM

## 2017-01-02 DIAGNOSIS — Z79891 Long term (current) use of opiate analgesic: Secondary | ICD-10-CM | POA: Diagnosis not present

## 2017-01-02 DIAGNOSIS — R7981 Abnormal blood-gas level: Secondary | ICD-10-CM

## 2017-01-02 DIAGNOSIS — B9561 Methicillin susceptible Staphylococcus aureus infection as the cause of diseases classified elsewhere: Secondary | ICD-10-CM | POA: Diagnosis present

## 2017-01-02 DIAGNOSIS — Z886 Allergy status to analgesic agent status: Secondary | ICD-10-CM

## 2017-01-02 DIAGNOSIS — G894 Chronic pain syndrome: Secondary | ICD-10-CM | POA: Diagnosis present

## 2017-01-02 DIAGNOSIS — Z452 Encounter for adjustment and management of vascular access device: Secondary | ICD-10-CM | POA: Diagnosis not present

## 2017-01-02 DIAGNOSIS — R600 Localized edema: Secondary | ICD-10-CM | POA: Diagnosis present

## 2017-01-02 DIAGNOSIS — I10 Essential (primary) hypertension: Secondary | ICD-10-CM | POA: Insufficient documentation

## 2017-01-02 DIAGNOSIS — Z8709 Personal history of other diseases of the respiratory system: Secondary | ICD-10-CM

## 2017-01-02 DIAGNOSIS — M549 Dorsalgia, unspecified: Secondary | ICD-10-CM

## 2017-01-02 DIAGNOSIS — M7989 Other specified soft tissue disorders: Secondary | ICD-10-CM | POA: Diagnosis not present

## 2017-01-02 DIAGNOSIS — D571 Sickle-cell disease without crisis: Secondary | ICD-10-CM | POA: Diagnosis present

## 2017-01-02 DIAGNOSIS — R7881 Bacteremia: Secondary | ICD-10-CM | POA: Diagnosis not present

## 2017-01-02 DIAGNOSIS — Z91048 Other nonmedicinal substance allergy status: Secondary | ICD-10-CM

## 2017-01-02 DIAGNOSIS — T827XXA Infection and inflammatory reaction due to other cardiac and vascular devices, implants and grafts, initial encounter: Secondary | ICD-10-CM | POA: Diagnosis not present

## 2017-01-02 DIAGNOSIS — D473 Essential (hemorrhagic) thrombocythemia: Secondary | ICD-10-CM | POA: Diagnosis not present

## 2017-01-02 LAB — BASIC METABOLIC PANEL WITH GFR
Anion gap: 5 (ref 5–15)
BUN: 7 mg/dL (ref 6–20)
CO2: 27 mmol/L (ref 22–32)
Calcium: 8.2 mg/dL — ABNORMAL LOW (ref 8.9–10.3)
Chloride: 106 mmol/L (ref 101–111)
Creatinine, Ser: 0.67 mg/dL (ref 0.44–1.00)
GFR calc Af Amer: >60 mL/min
GFR calc non Af Amer: >60 mL/min
Glucose, Bld: 87 mg/dL (ref 65–99)
Potassium: 3.5 mmol/L (ref 3.5–5.1)
Sodium: 138 mmol/L (ref 135–145)

## 2017-01-02 LAB — CBC WITH DIFFERENTIAL/PLATELET
Basophils Absolute: 0.1 10*3/uL (ref 0.0–0.1)
Basophils Absolute: 0.1 10*3/uL (ref 0.0–0.1)
Basophils Relative: 1 %
Basophils Relative: 1 %
Eosinophils Absolute: 0.3 10*3/uL (ref 0.0–0.7)
Eosinophils Absolute: 0.3 10*3/uL (ref 0.0–0.7)
Eosinophils Relative: 3 %
Eosinophils Relative: 3 %
HCT: 19.8 % — ABNORMAL LOW (ref 36.0–46.0)
HCT: 19.1 % — ABNORMAL LOW (ref 36.0–46.0)
Hemoglobin: 7.4 g/dL — ABNORMAL LOW (ref 12.0–15.0)
Hemoglobin: 6.8 g/dL — CL (ref 12.0–15.0)
Lymphocytes Relative: 20 %
Lymphocytes Relative: 19 %
Lymphs Abs: 1.9 10*3/uL (ref 0.7–4.0)
Lymphs Abs: 2.5 10*3/uL (ref 0.7–4.0)
MCH: 36.3 pg — ABNORMAL HIGH (ref 26.0–34.0)
MCH: 34.5 pg — ABNORMAL HIGH (ref 26.0–34.0)
MCHC: 37.4 g/dL — ABNORMAL HIGH (ref 30.0–36.0)
MCHC: 35.6 g/dL (ref 30.0–36.0)
MCV: 97.1 fL (ref 78.0–100.0)
MCV: 97 fL (ref 78.0–100.0)
Monocytes Absolute: 1.6 10*3/uL — ABNORMAL HIGH (ref 0.1–1.0)
Monocytes Absolute: 1.8 10*3/uL — ABNORMAL HIGH (ref 0.1–1.0)
Monocytes Relative: 17 %
Monocytes Relative: 13 %
Neutro Abs: 5.4 10*3/uL (ref 1.7–7.7)
Neutro Abs: 8.6 10*3/uL — ABNORMAL HIGH (ref 1.7–7.7)
Neutrophils Relative %: 59 %
Neutrophils Relative %: 65 %
Platelets: ADEQUATE 10*3/uL (ref 150–400)
Platelets: 138 10*3/uL — ABNORMAL LOW (ref 150–400)
RBC: 2.04 MIL/uL — ABNORMAL LOW (ref 3.87–5.11)
RBC: 1.97 MIL/uL — ABNORMAL LOW (ref 3.87–5.11)
RDW: 22.7 % — ABNORMAL HIGH (ref 11.5–15.5)
RDW: 22.4 % — ABNORMAL HIGH (ref 11.5–15.5)
WBC: 9.3 10*3/uL (ref 4.0–10.5)
WBC: 13.3 10*3/uL — ABNORMAL HIGH (ref 4.0–10.5)

## 2017-01-02 LAB — RETICULOCYTES
RBC.: 2.04 MIL/uL — ABNORMAL LOW (ref 3.87–5.11)
Retic Count, Absolute: 493.7 10*3/uL — ABNORMAL HIGH (ref 19.0–186.0)
Retic Ct Pct: 24.2 % — ABNORMAL HIGH (ref 0.4–3.1)

## 2017-01-02 LAB — COMPREHENSIVE METABOLIC PANEL
ALK PHOS: 122 U/L (ref 38–126)
ALT: 26 U/L (ref 14–54)
AST: 87 U/L — AB (ref 15–41)
Albumin: 2.3 g/dL — ABNORMAL LOW (ref 3.5–5.0)
Anion gap: 5 (ref 5–15)
BILIRUBIN TOTAL: 13.4 mg/dL — AB (ref 0.3–1.2)
BUN: 5 mg/dL — AB (ref 6–20)
CO2: 26 mmol/L (ref 22–32)
CREATININE: 0.53 mg/dL (ref 0.44–1.00)
Calcium: 7.7 mg/dL — ABNORMAL LOW (ref 8.9–10.3)
Chloride: 104 mmol/L (ref 101–111)
GFR calc Af Amer: >60 mL/min (ref >60)
Glucose, Bld: 105 mg/dL — ABNORMAL HIGH (ref 65–99)
Potassium: 3.1 mmol/L — ABNORMAL LOW (ref 3.5–5.1)
Sodium: 135 mmol/L (ref 135–145)
TOTAL PROTEIN: 7 g/dL (ref 6.5–8.1)

## 2017-01-02 LAB — TROPONIN I

## 2017-01-02 MED ORDER — DEXTROSE-NACL 5-0.45 % IV SOLN
INTRAVENOUS | Status: DC
  Administered 2017-01-02: 05:00:00 via INTRAVENOUS

## 2017-01-02 MED ORDER — HYDROMORPHONE HCL 2 MG/ML IJ SOLN
2.0000 mg | INTRAMUSCULAR | Status: DC
  Administered 2017-01-02 (×2): 2 mg via INTRAVENOUS
  Filled 2017-01-02 (×2): qty 1

## 2017-01-02 MED ORDER — GABAPENTIN 100 MG PO CAPS
100.0000 mg | ORAL_CAPSULE | Freq: Three times a day (TID) | ORAL | Status: DC
  Administered 2017-01-02 – 2017-01-25 (×67): 100 mg via ORAL
  Filled 2017-01-02 (×65): qty 1

## 2017-01-02 MED ORDER — POTASSIUM CHLORIDE CRYS ER 10 MEQ PO TBCR
10.0000 meq | EXTENDED_RELEASE_TABLET | Freq: Every day | ORAL | Status: DC
  Administered 2017-01-03 – 2017-01-15 (×12): 10 meq via ORAL
  Filled 2017-01-02 (×13): qty 1

## 2017-01-02 MED ORDER — HYDROMORPHONE HCL 2 MG/ML IJ SOLN
2.0000 mg | INTRAMUSCULAR | Status: AC
  Administered 2017-01-02: 2 mg via INTRAVENOUS
  Filled 2017-01-02: qty 1

## 2017-01-02 MED ORDER — HYDROMORPHONE HCL 2 MG/ML IJ SOLN
2.0000 mg | INTRAMUSCULAR | Status: AC
  Filled 2017-01-02: qty 1

## 2017-01-02 MED ORDER — DIPHENHYDRAMINE HCL 50 MG/ML IJ SOLN
25.0000 mg | Freq: Once | INTRAMUSCULAR | Status: AC
  Administered 2017-01-02: 25 mg via INTRAVENOUS
  Filled 2017-01-02: qty 1

## 2017-01-02 MED ORDER — KETOROLAC TROMETHAMINE 30 MG/ML IJ SOLN
30.0000 mg | Freq: Four times a day (QID) | INTRAMUSCULAR | Status: AC
  Administered 2017-01-02 – 2017-01-07 (×20): 30 mg via INTRAVENOUS
  Filled 2017-01-02 (×20): qty 1

## 2017-01-02 MED ORDER — HEPARIN SOD (PORK) LOCK FLUSH 100 UNIT/ML IV SOLN
INTRAVENOUS | Status: AC
  Filled 2017-01-02: qty 5

## 2017-01-02 MED ORDER — HYDROMORPHONE HCL 2 MG/ML IJ SOLN: 2.0000 mg | INTRAMUSCULAR | Status: DC

## 2017-01-02 MED ORDER — HYDROXYUREA 500 MG PO CAPS
1000.0000 mg | ORAL_CAPSULE | Freq: Every day | ORAL | Status: DC
  Administered 2017-01-03 – 2017-01-04 (×2): 1000 mg via ORAL
  Filled 2017-01-02 (×2): qty 2

## 2017-01-02 MED ORDER — DIPHENHYDRAMINE HCL 25 MG PO CAPS
25.0000 mg | ORAL_CAPSULE | ORAL | Status: DC | PRN
  Administered 2017-01-03: 50 mg via ORAL
  Filled 2017-01-02: qty 2

## 2017-01-02 MED ORDER — ACETAMINOPHEN 325 MG PO TABS
650.0000 mg | ORAL_TABLET | Freq: Four times a day (QID) | ORAL | Status: DC | PRN
  Administered 2017-01-08 – 2017-01-20 (×7): 650 mg via ORAL
  Filled 2017-01-02 (×7): qty 2

## 2017-01-02 MED ORDER — HYDROMORPHONE HCL 1 MG/ML IJ SOLN
1.0000 mg | Freq: Once | INTRAMUSCULAR | Status: AC
  Administered 2017-01-02: 1 mg via INTRAVENOUS
  Filled 2017-01-02: qty 1

## 2017-01-02 MED ORDER — ONDANSETRON HCL 4 MG PO TABS: 4.0000 mg | ORAL_TABLET | Freq: Four times a day (QID) | ORAL | Status: DC | PRN

## 2017-01-02 MED ORDER — KETOROLAC TROMETHAMINE 30 MG/ML IJ SOLN
30.0000 mg | INTRAMUSCULAR | Status: AC
  Administered 2017-01-02: 30 mg via INTRAVENOUS
  Filled 2017-01-02: qty 1

## 2017-01-02 MED ORDER — L-GLUTAMINE ORAL POWDER
5.0000 g | PACK | Freq: Two times a day (BID) | ORAL | Status: DC
  Filled 2017-01-02 (×27): qty 1

## 2017-01-02 MED ORDER — ONDANSETRON HCL 4 MG/2ML IJ SOLN
4.0000 mg | INTRAMUSCULAR | Status: DC | PRN
  Administered 2017-01-02: 4 mg via INTRAVENOUS
  Filled 2017-01-02: qty 2

## 2017-01-02 MED ORDER — SODIUM CHLORIDE 0.9 % IV SOLN
25.0000 mg | INTRAVENOUS | Status: DC | PRN
  Filled 2017-01-02: qty 0.5

## 2017-01-02 MED ORDER — ACETAMINOPHEN 650 MG RE SUPP: 650.0000 mg | Freq: Four times a day (QID) | RECTAL | Status: DC | PRN

## 2017-01-02 MED ORDER — SODIUM CHLORIDE 0.9 % IV BOLUS (SEPSIS)
1000.0000 mL | Freq: Once | INTRAVENOUS | Status: AC
  Administered 2017-01-02: 1000 mL via INTRAVENOUS

## 2017-01-02 MED ORDER — DOCUSATE SODIUM 100 MG PO CAPS
100.0000 mg | ORAL_CAPSULE | Freq: Two times a day (BID) | ORAL | Status: DC
  Administered 2017-01-02 – 2017-01-24 (×9): 100 mg via ORAL
  Filled 2017-01-02 (×28): qty 1

## 2017-01-02 MED ORDER — NALOXONE HCL 0.4 MG/ML IJ SOLN: 0.4000 mg | INTRAMUSCULAR | Status: DC | PRN

## 2017-01-02 MED ORDER — KETOROLAC TROMETHAMINE 30 MG/ML IJ SOLN
30.0000 mg | Freq: Once | INTRAMUSCULAR | Status: AC
  Administered 2017-01-02: 30 mg via INTRAVENOUS
  Filled 2017-01-02: qty 1

## 2017-01-02 MED ORDER — SODIUM CHLORIDE 0.9% FLUSH: 9.0000 mL | INTRAVENOUS | Status: DC | PRN

## 2017-01-02 MED ORDER — DEXTROSE-NACL 5-0.45 % IV SOLN
INTRAVENOUS | Status: DC
  Administered 2017-01-02 – 2017-01-04 (×6): via INTRAVENOUS

## 2017-01-02 MED ORDER — PROMETHAZINE HCL 25 MG/ML IJ SOLN
12.5000 mg | Freq: Once | INTRAMUSCULAR | Status: AC
  Administered 2017-01-02: 12.5 mg via INTRAVENOUS
  Filled 2017-01-02: qty 1

## 2017-01-02 MED ORDER — ONDANSETRON HCL 4 MG/2ML IJ SOLN
4.0000 mg | Freq: Four times a day (QID) | INTRAMUSCULAR | Status: DC | PRN
  Administered 2017-01-03 (×2): 4 mg via INTRAVENOUS
  Filled 2017-01-02 (×2): qty 2

## 2017-01-02 MED ORDER — FOLIC ACID 1 MG PO TABS
1.0000 mg | ORAL_TABLET | Freq: Every day | ORAL | Status: DC
Start: 2017-01-03 — End: 2017-01-25
  Administered 2017-01-03 – 2017-01-25 (×22): 1 mg via ORAL
  Filled 2017-01-02 (×22): qty 1

## 2017-01-02 MED ORDER — LORATADINE 10 MG PO TABS
10.0000 mg | ORAL_TABLET | Freq: Every day | ORAL | Status: DC
Start: 2017-01-03 — End: 2017-01-25
  Administered 2017-01-03 – 2017-01-25 (×23): 10 mg via ORAL
  Filled 2017-01-02 (×22): qty 1

## 2017-01-02 MED ORDER — ASPIRIN EC 81 MG PO TBEC
81.0000 mg | DELAYED_RELEASE_TABLET | Freq: Every day | ORAL | Status: DC
  Administered 2017-01-03 – 2017-01-10 (×8): 81 mg via ORAL
  Filled 2017-01-02 (×8): qty 1

## 2017-01-02 MED ORDER — ONDANSETRON HCL 4 MG/2ML IJ SOLN
4.0000 mg | Freq: Once | INTRAMUSCULAR | Status: AC
  Administered 2017-01-02: 4 mg via INTRAVENOUS
  Filled 2017-01-02: qty 2

## 2017-01-02 MED ORDER — MORPHINE SULFATE 2 MG/ML IV SOLN
INTRAVENOUS | Status: DC
  Administered 2017-01-03 (×2): 2 mg via INTRAVENOUS
  Administered 2017-01-03: 10:00:00 via INTRAVENOUS
  Administered 2017-01-03: 44.5 mg via INTRAVENOUS
  Filled 2017-01-02 (×3): qty 30

## 2017-01-02 MED ORDER — ENOXAPARIN SODIUM 40 MG/0.4ML ~~LOC~~ SOLN
40.0000 mg | SUBCUTANEOUS | Status: DC
  Administered 2017-01-03: 40 mg via SUBCUTANEOUS
  Filled 2017-01-02 (×3): qty 0.4

## 2017-01-02 MED ORDER — HYDROMORPHONE HCL 2 MG/ML IJ SOLN
2.0000 mg | INTRAMUSCULAR | Status: AC
  Administered 2017-01-02: 2 mg via INTRAVENOUS

## 2017-01-02 NOTE — ED Provider Notes (Signed)
[facility] DEPT Provider Note   CSN: [id number] Arrival date & time: 01/02/17  1524     History   Chief Complaint Chief Complaint  Patient presents with  . Sickle Cell Pain Crisis    HPI Dawn [name] is a 36 y.o. female.  Patient with known sickle cell anemia and frequent visits for pain management presents with a second time in the past 12 hours to the emergency department for pain control. She complains of pain in her arms, legs, chest, back. No fever, sweats, chills.  Severity of pain is moderate. She has been taking her home medication with minimal relief.      Past Medical History:  Diagnosis Date  . Blood transfusion   . Demand ischemia ([place]) 02/06/2016  . HCAP (healthcare-associated pneumonia) 05/19/2013  . 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 Pulmonary hypertension 02/06/2016   49 mmHg per echo on 02/06/16   . 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 Sickle cell anemia (HCC)   . Sickle cell disease ([place])   . 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 Vitamin B12 deficiency 07/07/2015    Patient Active Problem List   Diagnosis Date Noted  . Sickle cell anemia with pain ([place]) 11/06/2016  . Syncope 10/17/2016  . Dizziness   . Hypomagnesemia   . Swelling of right upper extremity   . 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 Sickle cell crisis ([place]) 10/03/2016  . Positive D dimer   . Pulmonary hypertension 02/16/2016  . 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 Anemia of chronic disease   . Chronic pain syndrome 10/14/2015  . 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 Vitamin B12 deficiency 07/07/2015  . Thrombocytopenia ([place]) 07/06/2015  . Hyperbilirubinemia 09/11/2014  . Macrocytosis 06/13/2014  . Vitamin D deficiency 11/23/2013  . 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 Hypokalemia 03/28/2012  . 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 Elevated LFTs 04/27/2011  . Sickle cell disease ([place])  04/26/2011    Past Surgical History:  Procedure Laterality Date  .  left knee ACL reconstruction    . CESAREAN SECTION     x 2  . CHOLECYSTECTOMY    . IR FLUORO GUIDE PORT INSERTION RIGHT  12/24/2016  . IR GENERIC HISTORICAL  07/17/2016   IR FLUORO GUIDE PORT INSERTION RIGHT 07/17/2016 Aletta Edouard, MD [facility]  . IR GENERIC HISTORICAL  07/17/2016   IR US GUIDE VASC ACCESS RIGHT 07/17/2016 Aletta Edouard, MD [facility]  . IR GENERIC HISTORICAL  10/12/2016   IR PTA VENOUS EXCEPT DIALYSIS CIRCUIT 10/12/2016 Arne Cleveland, MD [facility]  . IR GENERIC HISTORICAL  10/12/2016   IR US GUIDE VASC ACCESS RIGHT 10/12/2016 Arne Cleveland, MD [facility]  . IR GENERIC HISTORICAL  10/12/2016   IR VENO/EXT/UNI RIGHT 10/12/2016 Arne Cleveland, MD [facility]  . IR US GUIDE VASC ACCESS RIGHT  12/24/2016  . PORT-A-CATH REMOVAL Left 07/29/2013   Procedure: REMOVAL PORT-A-CATH;  Surgeon: Scherry Ran, MD;  Location: [facility];  Service: General;  Laterality: Left;  . PORT-A-CATH REMOVAL Right 11/14/2016   Procedure: REMOVAL PORT-A-CATH;  Surgeon: Aviva Signs, MD;  Location: [facility];  Service: General;  Laterality: Right;  . PORTACATH PLACEMENT    . PORTACATH PLACEMENT Left 02/23/2013   Procedure: INSERTION PORT-A-CATH;  Surgeon: Donato Heinz, MD;  Location: [facility];  Service: General;  Laterality: Left;  . TEE WITHOUT CARDIOVERSION N/A 08/07/2013   Procedure: TRANSESOPHAGEAL ECHOCARDIOGRAM (TEE);  Surgeon: Roderic Palau  Dale Bratenahl, MD;  [facility];  Service: Cardiology;  Laterality: N/A;    OB History    Gravida Para Term Preterm AB Living   1 1   1   1    SAB TAB Ectopic Multiple Live Births                   Home Medications    Prior to Admission medications   Medication Sig Start Date End Date Taking? Authorizing Provider  aspirin EC 81 MG EC tablet Take 1 tablet (81 mg total) by mouth daily. 10/18/16   Leana Gamer, MD  folic acid (FOLVITE) 1 MG tablet Take 1 tablet  (1 mg total) by mouth daily. 03/14/16   [name], FNP  gabapentin (NEURONTIN) 100 MG capsule Take 1 capsule (100 mg total) by mouth 3 (three) times daily. 12/13/16   [name], FNP  hydroxyurea (HYDREA) 500 MG capsule Take 2 capsules (1,000 mg total) by mouth daily. May take with food to minimize GI side effects. 03/14/16   [name], FNP  L-glutamine (ENDARI) 5 g PACK Powder Packet Take 5 g by mouth 2 (two) times daily. 12/13/16   [name], FNP  levonorgestrel (MIRENA) 20 MCG/24HR IUD 1 each by Intrauterine route once.    Historical Provider, MD  loratadine (CLARITIN) 10 MG tablet Take 10 mg by mouth daily.    Historical Provider, MD  metroNIDAZOLE (FLAGYL) 500 MG tablet Take 1 tablet (500 mg total) by mouth 2 (two) times daily. 12/27/16   Noemi Chapel, MD  morphine (MS CONTIN) 15 MG 12 hr tablet Take 1 tablet (15 mg total) by mouth every 12 (twelve) hours. 12/13/16   [name], FNP  Multiple Vitamin (MULTIVITAMIN WITH MINERALS) TABS tablet Take 1 tablet by mouth daily.    Historical Provider, MD  Oxycodone HCl 10 MG TABS Take 1 tablet (10 mg total) by mouth every 4 (four) hours as needed. Patient taking differently: Take 10 mg by mouth every 4 (four) hours as needed. pain 12/13/16   [name], FNP  potassium chloride (K-DUR) 10 MEQ tablet Take 1 tablet (10 mEq total) by mouth daily. 12/27/16   Noemi Chapel, MD  promethazine (PHENERGAN) 12.5 MG tablet Take 1 tablet (12.5 mg total) by mouth every 6 (six) hours as needed for nausea or vomiting. 12/13/16   [name], FNP  sulfamethoxazole-trimethoprim (BACTRIM DS,SEPTRA DS) 800-160 MG tablet Take 1 tablet by mouth 2 (two) times daily. 12/27/16 01/03/17  Noemi Chapel, MD  Vitamin D, Ergocalciferol, (DRISDOL) 50000 units CAPS capsule Take 1 capsule (50,000 Units total) by mouth every 7 (seven) days. Takes on Sundays. 06/01/16   Micheline Chapman, NP    Family History Family History  Problem Relation Age of Onset  .  Sickle cell trait Mother   . Sickle cell trait Father   . Hypertension Father   . Diabetes Father   . Sickle cell trait Sister   . Cancer Paternal Aunt     Breast    Social History Social History  Substance Use Topics  . Smoking status: Never Smoker  . Smokeless tobacco: Never Used  . Alcohol use No     Allergies   Desferal [deferoxamine]; Latex; Lisinopril; Tape; and Tylenol [acetaminophen]   Review of Systems Review of Systems  All other systems reviewed and are negative.    Physical Exam Updated Vital Signs BP 137/82   Pulse 100   Temp 99.1  F (37.3 C) (Oral)   Resp 17   Ht 5\' 3"  (1.6 m)   Wt 168 lb (76.2 kg)   SpO2 99%   BMI 29.76 kg/m   Physical Exam  Constitutional: She is oriented to person, place, and time. She appears well-developed and well-nourished.  HENT:  Head: Normocephalic and atraumatic.  Eyes: Conjunctivae are normal.  Neck: Neck supple.  Cardiovascular: Normal rate and regular rhythm.   Pulmonary/Chest: Effort normal and breath sounds normal.  Abdominal: Soft. Bowel sounds are normal.  Musculoskeletal:  Pain in arms, legs, back, chest  Neurological: She is alert and oriented to person, place, and time.  Skin: Skin is warm and dry.  Psychiatric: She has a normal mood and affect. Her behavior is normal.  Nursing note and vitals reviewed.    ED Treatments / Results  Labs (all labs ordered are listed, but only abnormal results are displayed) Labs Reviewed  CBC WITH DIFFERENTIAL/PLATELET - Abnormal; Notable for the following:       Result Value   WBC 13.3 (*)    RBC 1.97 (*)    Hemoglobin 6.8 (*)    HCT 19.1 (*)    MCH 34.5 (*)    RDW 22.4 (*)    Platelets 138 (*)    Neutro Abs 8.6 (*)    Monocytes Absolute 1.8 (*)    All other components within normal limits  BASIC METABOLIC PANEL - Abnormal; Notable for the following:    Calcium 8.2 (*)    All other components within normal limits  TROPONIN I    EKG  EKG  Interpretation None       Radiology No results found.  Procedures Procedures (including critical care time)  Medications Ordered in ED Medications  HYDROmorphone (DILAUDID) injection 1 mg (not administered)  sodium chloride 0.9 % bolus 1,000 mL (1,000 mLs Intravenous New Bag/Given 01/02/17 1641)  ondansetron (ZOFRAN) injection 4 mg (4 mg Intravenous Given 01/02/17 1642)  HYDROmorphone (DILAUDID) injection 1 mg (1 mg Intravenous Given 01/02/17 1642)  HYDROmorphone (DILAUDID) injection 1 mg (1 mg Intravenous Given 01/02/17 1648)  promethazine (PHENERGAN) injection 12.5 mg (12.5 mg Intravenous Given 01/02/17 1742)     Initial Impression / Assessment and Plan / ED Course  I have reviewed the triage vital signs and the nursing notes.  Pertinent labs & imaging results that were available during my care of the patient were reviewed by me and considered in my medical decision making (see chart for details).     Will hydrate, treat pain, admit.  Final Clinical Impressions(s) / ED Diagnoses   Final diagnoses:  Sickle cell pain crisis [facility])    New Prescriptions New Prescriptions   No medications on file     Nat Christen, MD 01/02/17 1837

## 2017-01-02 NOTE — ED Triage Notes (Signed)
Pt c/o pain all over body related to her sickle cell that started yesterday

## 2017-01-02 NOTE — ED Notes (Signed)
Date and time results received: 01/02/17 1725  Test: Hemoglobin Critical Value: 6.8  Name of Provider Notified: Dr. Lacinda Axon  Orders Received? Or Actions Taken?: No new orders given at this time. EDP reported pt to be admitted.

## 2017-01-02 NOTE — ED Triage Notes (Signed)
Pt c/o sickle cell pain starting when she woke up today. Also states pain is now in her chest.

## 2017-01-02 NOTE — ED Provider Notes (Signed)
[facility] DEPT Provider Note   CSN: [id number] Arrival date & time: 01/02/17  0056  Time seen 03:50 AM   History   Chief Complaint Chief Complaint  Patient presents with  . Sickle Cell Pain Crisis    HPI Dawn [name] is a 36 y.o. female.  HPI   patient has a history of sickle cell disease. She states she started having a crisis on April 9. She states she has pain in her arms and legs which is typical for her crisis. She denies fever, chest pain, shortness of breath, nausea, vomiting, or diarrhea. She states she took her MS Contin at 7 PM and her oxycodone at 11 PM without improvement. She states she has gotten blood transfusions, the last was in February. She states they normally wait for her hemoglobin to get in the 5 range because she has a lot of antibodies. She reports her sickle cell crises are worse with the acute weather changes. She states she was started on a new medication, Endari,  about 3 weeks ago.  PCP [name], FNP   Past Medical History:  Diagnosis Date  . Blood transfusion   . Demand ischemia ([place]) 02/06/2016  . HCAP (healthcare-associated pneumonia) 05/19/2013  . 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 Pulmonary hypertension 02/06/2016   49 mmHg per echo on 02/06/16   . 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 Sickle cell anemia (HCC)   . Sickle cell disease ([place])   . 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 Vitamin B12 deficiency 07/07/2015    Patient Active Problem List   Diagnosis Date Noted  . Sickle cell anemia with pain ([place]) 11/06/2016  . Syncope 10/17/2016  . Dizziness   . Hypomagnesemia   . Swelling of right upper extremity   . 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 Sickle cell crisis ([place]) 10/03/2016  . Positive D dimer   . Pulmonary hypertension 02/16/2016  . 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 Anemia of chronic disease   . Chronic pain syndrome 10/14/2015  . 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 Vitamin B12 deficiency 07/07/2015    . Thrombocytopenia ([place]) 07/06/2015  . Hyperbilirubinemia 09/11/2014  . Macrocytosis 06/13/2014  . Vitamin D deficiency 11/23/2013  . 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 Hypokalemia 03/28/2012  . 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 Elevated LFTs 04/27/2011  . Sickle cell disease ([place]) 04/26/2011    Past Surgical History:  Procedure Laterality Date  .  left knee ACL reconstruction    . CESAREAN SECTION     x 2  . CHOLECYSTECTOMY    . IR FLUORO GUIDE PORT INSERTION RIGHT  12/24/2016  . IR GENERIC HISTORICAL  07/17/2016   IR FLUORO GUIDE PORT INSERTION RIGHT 07/17/2016 Aletta Edouard, MD [facility]  . IR GENERIC HISTORICAL  07/17/2016   IR US GUIDE VASC ACCESS RIGHT 07/17/2016 Aletta Edouard, MD [facility]  . IR GENERIC HISTORICAL  10/12/2016   IR PTA VENOUS EXCEPT DIALYSIS CIRCUIT 10/12/2016 Arne Cleveland, MD [facility]  . IR GENERIC HISTORICAL  10/12/2016   IR US GUIDE VASC ACCESS RIGHT 10/12/2016 Arne Cleveland, MD [facility]  . IR GENERIC HISTORICAL  10/12/2016   IR VENO/EXT/UNI RIGHT 10/12/2016 Arne Cleveland, MD [facility]  . IR US GUIDE VASC ACCESS RIGHT  12/24/2016  . PORT-A-CATH REMOVAL Left 07/29/2013   Procedure: REMOVAL PORT-A-CATH;  Surgeon: Scherry Ran, MD;  Location: [facility];  Service: General;  Laterality: Left;  .  PORT-A-CATH REMOVAL Right 11/14/2016   Procedure: REMOVAL PORT-A-CATH;  Surgeon: Aviva Signs, MD;  Location: [facility];  Service: General;  Laterality: Right;  . PORTACATH PLACEMENT    . PORTACATH PLACEMENT Left 02/23/2013   Procedure: INSERTION PORT-A-CATH;  Surgeon: Donato Heinz, MD;  Location: [facility];  Service: General;  Laterality: Left;  . TEE WITHOUT CARDIOVERSION N/A 08/07/2013   Procedure: TRANSESOPHAGEAL ECHOCARDIOGRAM (TEE);  Surgeon: Arnoldo Lenis, MD;  Location: [facility];  Service: Cardiology;  Laterality: N/A;    OB History    Gravida Para  Term Preterm AB Living   1 1   1   1    SAB TAB Ectopic Multiple Live Births                   Home Medications    Prior to Admission medications   Medication Sig Start Date End Date Taking? Authorizing Provider  aspirin EC 81 MG EC tablet Take 1 tablet (81 mg total) by mouth daily. 10/18/16   Leana Gamer, MD  folic acid (FOLVITE) 1 MG tablet Take 1 tablet (1 mg total) by mouth daily. 03/14/16   [name], FNP  gabapentin (NEURONTIN) 100 MG capsule Take 1 capsule (100 mg total) by mouth 3 (three) times daily. 12/13/16   [name], FNP  hydroxyurea (HYDREA) 500 MG capsule Take 2 capsules (1,000 mg total) by mouth daily. May take with food to minimize GI side effects. 03/14/16   [name], FNP  L-glutamine (ENDARI) 5 g PACK Powder Packet Take 5 g by mouth 2 (two) times daily. 12/13/16   [name], FNP  levonorgestrel (MIRENA) 20 MCG/24HR IUD 1 each by Intrauterine route once.    Historical Provider, MD  loratadine (CLARITIN) 10 MG tablet Take 10 mg by mouth daily.    Historical Provider, MD  metroNIDAZOLE (FLAGYL) 500 MG tablet Take 1 tablet (500 mg total) by mouth 2 (two) times daily. 12/27/16   Noemi Chapel, MD  morphine (MS CONTIN) 15 MG 12 hr tablet Take 1 tablet (15 mg total) by mouth every 12 (twelve) hours. 12/13/16   [name], FNP  Multiple Vitamin (MULTIVITAMIN WITH MINERALS) TABS tablet Take 1 tablet by mouth daily.    Historical Provider, MD  Oxycodone HCl 10 MG TABS Take 1 tablet (10 mg total) by mouth every 4 (four) hours as needed. Patient taking differently: Take 10 mg by mouth every 4 (four) hours as needed. pain 12/13/16   [name], FNP  potassium chloride (K-DUR) 10 MEQ tablet Take 1 tablet (10 mEq total) by mouth daily. 12/27/16   Noemi Chapel, MD  promethazine (PHENERGAN) 12.5 MG tablet Take 1 tablet (12.5 mg total) by mouth every 6 (six) hours as needed for nausea or vomiting. 12/13/16   [name], FNP    sulfamethoxazole-trimethoprim (BACTRIM DS,SEPTRA DS) 800-160 MG tablet Take 1 tablet by mouth 2 (two) times daily. 12/27/16 01/03/17  Noemi Chapel, MD  Vitamin D, Ergocalciferol, (DRISDOL) 50000 units CAPS capsule Take 1 capsule (50,000 Units total) by mouth every 7 (seven) days. Takes on Sundays. 06/01/16   Micheline Chapman, NP    Family History Family History  Problem Relation Age of Onset  . Sickle cell trait Mother   . Sickle cell trait Father   . Hypertension Father   . Diabetes Father   . Sickle cell trait Sister   . Cancer Paternal Aunt     Breast  Social History Social History  Substance Use Topics  . Smoking status: Never Smoker  . Smokeless tobacco: Never Used  . Alcohol use No  employed Going to nursing school   Allergies   Desferal [deferoxamine]; Latex; Lisinopril; Tape; and Tylenol [acetaminophen]   Review of Systems Review of Systems  All other systems reviewed and are negative.    Physical Exam Updated Vital Signs BP (!) 150/84 (BP Location: Left Arm)   Pulse (!) 104   Temp 98.2 F (36.8 C) (Oral)   Resp (!) 22   Ht 5\' 3"  (1.6 m)   Wt 168 lb 3.4 oz (76.3 kg)   SpO2 96%   BMI 29.80 kg/m   Physical Exam  Constitutional: She is oriented to person, place, and time. She appears well-developed and well-nourished.  Non-toxic appearance. She does not appear ill. No distress.  HENT:  Head: Normocephalic and atraumatic.  Right Ear: External ear normal.  Left Ear: External ear normal.  Nose: Nose normal. No mucosal edema or rhinorrhea.  Mouth/Throat: Oropharynx is clear and moist and mucous membranes are normal. No dental abscesses or uvula swelling.  Eyes: Conjunctivae and EOM are normal. Pupils are equal, round, and reactive to light. Scleral icterus is present.  Neck: Normal range of motion and full passive range of motion without pain. Neck supple.  Cardiovascular: Normal rate, regular rhythm and normal heart sounds.  Exam reveals no gallop and no  friction rub.   No murmur heard. Pulmonary/Chest: Effort normal and breath sounds normal. No respiratory distress. She has no wheezes. She has no rhonchi. She has no rales. She exhibits no tenderness and no crepitus.  New port in her right chest  Abdominal: Soft. Normal appearance and bowel sounds are normal. She exhibits no distension. There is no tenderness. There is no rebound and no guarding.  Musculoskeletal: Normal range of motion. She exhibits no edema or tenderness.  Moves all extremities well. No joint effusions  Neurological: She is alert and oriented to person, place, and time. She has normal strength. No cranial nerve deficit.  Skin: Skin is warm, dry and intact. No rash noted. No erythema. No pallor.  Psychiatric: She has a normal mood and affect. Her speech is normal and behavior is normal. Her mood appears not anxious.  Nursing note and vitals reviewed.    ED Treatments / Results  Labs (all labs ordered are listed, but only abnormal results are displayed) Results for orders placed or performed during the hospital encounter of 01/02/17  Reticulocytes  Result Value Ref Range   Retic Ct Pct 24.2 (H) 0.4 - 3.1 %   RBC. 2.04 (L) 3.87 - 5.11 MIL/uL   Retic Count, Manual 493.7 (H) 19.0 - 186.0 K/uL  Comprehensive metabolic panel  Result Value Ref Range   Sodium 135 135 - 145 mmol/L   Potassium 3.1 (L) 3.5 - 5.1 mmol/L   Chloride 104 101 - 111 mmol/L   CO2 26 22 - 32 mmol/L   Glucose, Bld 105 (H) 65 - 99 mg/dL   BUN 5 (L) 6 - 20 mg/dL   Creatinine, Ser 0.53 0.44 - 1.00 mg/dL   Calcium 7.7 (L) 8.9 - 10.3 mg/dL   Total Protein 7.0 6.5 - 8.1 g/dL   Albumin 2.3 (L) 3.5 - 5.0 g/dL   AST 87 (H) 15 - 41 U/L   ALT 26 14 - 54 U/L   Alkaline Phosphatase 122 38 - 126 U/L   Total Bilirubin 13.4 (H) 0.3 - 1.2 mg/dL  GFR calc non Af Amer >60 >60 mL/min   GFR calc Af Amer >60 >60 mL/min   Anion gap 5 5 - 15  CBC WITH DIFFERENTIAL  Result Value Ref Range   WBC 9.3 4.0 - 10.5 K/uL     RBC 2.04 (L) 3.87 - 5.11 MIL/uL   Hemoglobin 7.4 (L) 12.0 - 15.0 g/dL   HCT 19.8 (L) 36.0 - 46.0 %   MCV 97.1 78.0 - 100.0 fL   MCH 36.3 (H) 26.0 - 34.0 pg   MCHC 37.4 (H) 30.0 - 36.0 g/dL   RDW 22.7 (H) 11.5 - 15.5 %   Platelets  150 - 400 K/uL    PLATELET CLUMPS NOTED ON SMEAR, COUNT APPEARS ADEQUATE   Neutrophils Relative % 59 %   Lymphocytes Relative 20 %   Monocytes Relative 17 %   Eosinophils Relative 3 %   Basophils Relative 1 %   Neutro Abs 5.4 1.7 - 7.7 K/uL   Lymphs Abs 1.9 0.7 - 4.0 K/uL   Monocytes Absolute 1.6 (H) 0.1 - 1.0 K/uL   Eosinophils Absolute 0.3 0.0 - 0.7 K/uL   Basophils Absolute 0.1 0.0 - 0.1 K/uL   RBC Morphology TARGET CELLS    Laboratory interpretation all normal except leukocytosis, stable anemia, elevated stable total bilirubin    EKG  EKG Interpretation None       Radiology No results found.  Procedures Procedures (including critical care time)  Medications Ordered in ED Medications  dextrose 5 %-0.45 % sodium chloride infusion ( Intravenous New Bag/Given 01/02/17 0435)  HYDROmorphone (DILAUDID) injection 2 mg (2 mg Intravenous Not Given 01/02/17 0607)  HYDROmorphone (DILAUDID) injection 2 mg (2 mg Intravenous Given 01/02/17 0611)  ondansetron (ZOFRAN) injection 4 mg (4 mg Intravenous Given 01/02/17 0435)  ketorolac (TORADOL) 30 MG/ML injection 30 mg (30 mg Intravenous Given 01/02/17 0435)  HYDROmorphone (DILAUDID) injection 2 mg (2 mg Intravenous Given 01/02/17 0436)  HYDROmorphone (DILAUDID) injection 2 mg (2 mg Intravenous Given 01/02/17 0537)  diphenhydrAMINE (BENADRYL) injection 25 mg (25 mg Intravenous Given 01/02/17 0435)  diphenhydrAMINE (BENADRYL) injection 25 mg (25 mg Intravenous Given 01/02/17 0609)     Initial Impression / Assessment and Plan / ED Course  I have reviewed the triage vital signs and the nursing notes.  Pertinent labs & imaging results that were available during my care of the patient were reviewed by me and  considered in my medical decision making (see chart for details).  Pt was started on the sickle cell protocol. After her 3rd dose of dilaudid she felt ready to be discharged, stating she could manage her pain at home now.   Final Clinical Impressions(s) / ED Diagnoses   Final diagnoses:  Sickle cell pain crisis [facility])   Plan discharge  [place] Porter, MD, Barbette Or, MD 01/02/17 [phone]
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
## 2017-01-02 NOTE — Discharge Instructions (Signed)
Drink plenty of fluids. Take your pain medications as scheduled.  Return if you get a fever, chest pain or you get worse.

## 2017-01-03 DIAGNOSIS — D638 Anemia in other chronic diseases classified elsewhere: Secondary | ICD-10-CM

## 2017-01-03 LAB — BASIC METABOLIC PANEL
Anion gap: 5 (ref 5–15)
BUN: 8 mg/dL (ref 6–20)
CALCIUM: 7.7 mg/dL — AB (ref 8.9–10.3)
CO2: 25 mmol/L (ref 22–32)
CREATININE: 0.59 mg/dL (ref 0.44–1.00)
Chloride: 106 mmol/L (ref 101–111)
GFR calc non Af Amer: >60 mL/min (ref >60)
Glucose, Bld: 123 mg/dL — ABNORMAL HIGH (ref 65–99)
Potassium: 3 mmol/L — ABNORMAL LOW (ref 3.5–5.1)
SODIUM: 136 mmol/L (ref 135–145)

## 2017-01-03 LAB — CBC
HCT: 17.8 % — ABNORMAL LOW (ref 36.0–46.0)
Hemoglobin: 6.6 g/dL — CL (ref 12.0–15.0)
MCH: 36.1 pg — AB (ref 26.0–34.0)
MCHC: 37.1 g/dL — ABNORMAL HIGH (ref 30.0–36.0)
MCV: 97.3 fL (ref 78.0–100.0)
PLATELETS: 123 10*3/uL — AB (ref 150–400)
RBC: 1.83 MIL/uL — ABNORMAL LOW (ref 3.87–5.11)
RDW: 22.4 % — AB (ref 11.5–15.5)
WBC: 12.6 10*3/uL — AB (ref 4.0–10.5)

## 2017-01-03 MED ORDER — MORPHINE SULFATE ER 15 MG PO TBCR
15.0000 mg | EXTENDED_RELEASE_TABLET | Freq: Two times a day (BID) | ORAL | Status: DC
  Administered 2017-01-03 – 2017-01-16 (×26): 15 mg via ORAL
  Filled 2017-01-03 (×27): qty 1

## 2017-01-03 MED ORDER — PROMETHAZINE HCL 12.5 MG PO TABS
12.5000 mg | ORAL_TABLET | Freq: Four times a day (QID) | ORAL | Status: DC | PRN
  Administered 2017-01-03: 12.5 mg via ORAL
  Filled 2017-01-03: qty 1

## 2017-01-03 MED ORDER — OXYCODONE HCL 5 MG PO TABS
10.0000 mg | ORAL_TABLET | ORAL | Status: DC | PRN
  Administered 2017-01-03 – 2017-01-23 (×8): 10 mg via ORAL
  Filled 2017-01-03 (×8): qty 2

## 2017-01-03 MED ORDER — MORPHINE SULFATE 2 MG/ML IV SOLN
INTRAVENOUS | Status: DC
  Administered 2017-01-03: 27.82 mg via INTRAVENOUS
  Administered 2017-01-03: 24 mg via INTRAVENOUS
  Administered 2017-01-03 – 2017-01-04 (×2): via INTRAVENOUS
  Filled 2017-01-03 (×3): qty 30

## 2017-01-03 MED ORDER — METRONIDAZOLE 500 MG PO TABS
500.0000 mg | ORAL_TABLET | Freq: Two times a day (BID) | ORAL | Status: DC
  Administered 2017-01-03 – 2017-01-12 (×18): 500 mg via ORAL
  Filled 2017-01-03 (×18): qty 1

## 2017-01-03 MED ORDER — POTASSIUM CHLORIDE CRYS ER 20 MEQ PO TBCR
40.0000 meq | EXTENDED_RELEASE_TABLET | Freq: Once | ORAL | Status: AC
  Administered 2017-01-03: 40 meq via ORAL
  Filled 2017-01-03: qty 2

## 2017-01-03 NOTE — Progress Notes (Signed)
PROGRESS NOTE  Dawn [name] WJX:[id number] DOB: [birth year] DOA: 01/02/2017 PCP: [name], FNP  Brief Narrative: 85yow PMH sickle cell anemia prsented with chest, back, arm and leg pain c/w usual SSA criis pain. Has had 13 admissions last 6 monhts and 27 ED visits last six month. Admitted for SSA pain crisis.  Assessment/Plan 1. SSA pain crisis. No evidence of infection. Denies any home issues or other stressors. Given frequent ED visits will consult CSW to explore for triggers, underlying psychosocial issues. Chronic associated hyperbilirubinemia. 2. Hemoglobin stable. Avoid transfusion unless less than 6 and symptomatic given antibiotics. Monitor clinically.  Poor pain control. Will adjust PCA. Add Phenergan as needed.   Not ready to go home, pain 8/10.  DVT prophylaxis: enoxaparin Code Status: full code Family Communication: none Disposition Plan: home    Murray Hodgkins, MD  [facility] Direct contact: [phone] --[place]ia [place]  --[email or web address]; password [email or web address]  7PM-7AM contact night coverage as above 01/03/2017, 11:31 AM  LOS: 1 day   Consultants:    Procedures:    Antimicrobials:    Interval history/Subjective: Continues to have pain in typical location, up to 8/10 with only brief improvement to 6/10 with analgesia. Eating ok. Breathing ok. Denies any problems at home.  Objective: Vitals:   01/03/17 0355 01/03/17 0625 01/03/17 0754 01/03/17 1003  BP:  106/67    Pulse:  89    Resp: 20 18 14 15   Temp:  98.6 F (37 C)    TempSrc:  Oral    SpO2: 95% 95% 97% 96%  Weight:      Height:        Intake/Output Summary (Last 24 hours) at 01/03/17 1131 Last data filed at 01/02/17 1932  Gross per 24 hour  Intake             1000 ml  Output                0 ml  Net             1000 ml     Filed Weights   01/02/17 1533 01/02/17 2110  Weight: 76.2 kg (168 lb) 76.2 kg (168 lb)    Exam:    Constitutional: Appears to be in pain but otherwise  stable   Cardiovascular: Regular rate and rhythm. No murmur, rub or gallop.  Respiratory: Clear to auscultation bilaterally. No wheezes, rales or rhonchi. Normal respiratory effort.  Psychiatric: Grossly normal mood and affect. Speech fluent and appropriate.   I have personally reviewed following labs and imaging studies:  BMP noted, K+ 3.0  T bili 13.4 on admission (chronic)  plts 123, chronic  HGb 6.6, stable, appears at baseline  Scheduled Meds: . aspirin EC  81 mg Oral Daily  . docusate sodium  100 mg Oral BID  . enoxaparin (LOVENOX) injection  40 mg Subcutaneous Q24H  . folic acid  1 mg Oral Daily  . gabapentin  100 mg Oral TID  . hydroxyurea  1,000 mg Oral Daily  . ketorolac  30 mg Intravenous Q6H  . L-glutamine  5 g Oral BID  . loratadine  10 mg Oral Daily  . morphine   Intravenous Q4H  . potassium chloride  10 mEq Oral Daily   Continuous Infusions: . dextrose 5 % and 0.45% NaCl 150 mL/hr at 01/03/17 1009    Principal Problem:   Sickle cell crisis (HCC) Active Problems:   Elevated LFTs   Hyperbilirubinemia   Chronic pain syndrome  Anemia of chronic disease   LOS: 1 day

## 2017-01-03 NOTE — Progress Notes (Addendum)
CRITICAL VALUE ALERT  Critical value received:  Hemoglobin 6.6  Date of notification:  [id number]  Time of notification:  0613  Critical value read back: yes  Nurse who received alert:  [email or web address]  MD notified (1st page):  Georges Mouse  Time of first page:  [phone]  MD notified (2nd page):  Time of second page:  Responding MD: Georges Mouse   Time MD responded:  [phone]

## 2017-01-03 NOTE — Progress Notes (Addendum)
Critical lab value received - Hemoglobin 6.6  -  J Kim notified Plan to check another CBG at 12noon- Dr Julianne Rice response

## 2017-01-04 ENCOUNTER — Inpatient Hospital Stay (HOSPITAL_COMMUNITY): Payer: Medicare Other

## 2017-01-04 MED ORDER — SODIUM CHLORIDE 0.9% FLUSH
9.0000 mL | INTRAVENOUS | Status: DC | PRN
  Administered 2017-01-10 (×2): 9 mL via INTRAVENOUS
  Filled 2017-01-04 (×2): qty 9

## 2017-01-04 MED ORDER — MORPHINE SULFATE 2 MG/ML IV SOLN: INTRAVENOUS | Status: DC

## 2017-01-04 MED ORDER — PROMETHAZINE HCL 25 MG RE SUPP
12.5000 mg | Freq: Four times a day (QID) | RECTAL | Status: DC | PRN
  Administered 2017-01-04: 12.5 mg via RECTAL
  Filled 2017-01-04: qty 1

## 2017-01-04 MED ORDER — HYDROMORPHONE 1 MG/ML IV SOLN
INTRAVENOUS | Status: DC
  Administered 2017-01-04: 4.72 mg via INTRAVENOUS
  Administered 2017-01-04: 15:00:00 via INTRAVENOUS
  Administered 2017-01-04: 8.76 mg via INTRAVENOUS
  Administered 2017-01-05: 2.1 mg via INTRAVENOUS
  Administered 2017-01-05: 7.28 mg via INTRAVENOUS
  Administered 2017-01-05: 01:00:00 via INTRAVENOUS
  Administered 2017-01-05: 7.85 mg via INTRAVENOUS
  Administered 2017-01-05: 1 mg via INTRAVENOUS
  Administered 2017-01-06: 4.9 mg via INTRAVENOUS
  Administered 2017-01-07 – 2017-01-08 (×2): via INTRAVENOUS
  Filled 2017-01-04 (×5): qty 25

## 2017-01-04 MED ORDER — DIPHENHYDRAMINE HCL 12.5 MG/5ML PO ELIX
12.5000 mg | ORAL_SOLUTION | Freq: Three times a day (TID) | ORAL | Status: DC | PRN
  Administered 2017-01-04 – 2017-01-24 (×9): 12.5 mg via ORAL
  Filled 2017-01-04 (×9): qty 5

## 2017-01-04 MED ORDER — SODIUM CHLORIDE 0.9 % IV SOLN
250.0000 mL | INTRAVENOUS | Status: DC | PRN
  Administered 2017-01-04 – 2017-01-17 (×4): 250 mL via INTRAVENOUS

## 2017-01-04 MED ORDER — NALOXONE HCL 0.4 MG/ML IJ SOLN: 0.4000 mg | INTRAMUSCULAR | Status: DC | PRN

## 2017-01-04 NOTE — Progress Notes (Addendum)
PROGRESS NOTE  Dawn [name] TML:[id number] DOB: 11/08/1980 DOA: 01/02/2017 PCP: [name], FNP  Brief Narrative: 84yow PMH sickle cell anemia prsented with chest, back, arm and leg pain c/w usual SSA criis pain. Has had 13 admissions last 6 monhts and 27 ED visits last six month. Admitted for SSA pain crisis.  Assessment/Plan 1. SSA pain crisis. Pain continues 8/10, remains poorly controlled. Was started on morphine PCA this admission but usually uses Dilaudid. Vomited this morning, continues to have nausea. Chest x-ray reviewed, no acute disease. No fever. Lungs are clear. No evidence to suggest acute chest.  Pain poorly controlled, change to Dilaudid PCA as previously used, previous care plan utilized for initial settings.  Wean oxygen.  2. 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   Pain remains poorly controlled. Not ready for discharge.  DVT prophylaxis: enoxaparin Code Status: full code Family Communication: none Disposition Plan: home    Murray Hodgkins, MD  [facility] Direct contact: [phone] --Via [place]  --[email or web address]; password [email or web address]  7PM-7AM contact night coverage as above 01/04/2017, 3:35 PM  LOS: 2 days   Consultants:    Procedures:    Antimicrobials:    Interval history/Subjective: Pain level still 8/10. Little short of breath. Eating okay. Hypoxic overnight.  Objective: Vitals:   01/04/17 0836 01/04/17 1018 01/04/17 1412 01/04/17 1433  BP:  (!) 146/77 (!) 150/74   Pulse:  (!) 104 (!) 108   Resp: (!) 25 18 18 20   Temp:  98.2 F (36.8 C) 100.2 F (37.9 C)   TempSrc:  Oral Oral   SpO2: 98% 95% 90% 95%  Weight:      Height:        Intake/Output Summary (Last 24 hours) at 01/04/17 1535 Last data filed at 01/04/17 0301  Gross per 24 hour  Intake             5161 ml  Output                0 ml  Net             5161 ml     Filed Weights   01/02/17 1533 01/02/17 2110  Weight: 76.2 kg (168 lb) 76.2 kg (168  lb)    Exam:  Constitutional: Appears calm, moderately uncomfortable.  Eyes. Pupils, irises, lids appear unremarkable.  ENT. Lips and tongue appear unremarkable.  Respiratory: Clear to auscultation bilaterally. No wheezes, rales or rhonchi. Mild increased respiratory effort.  Cardiovascular: Tachycardic, regular rhythm. No murmur, rub or gallop. 1+ bilateral lower extremity edema.  Psychiatric: Grossly normal mood and affect. Judgment and insight appear intact. Speech fluent and appropriate.  I have personally reviewed the following:   Labs:    Imaging studies:  Chest x-ray independently reviewed, no acute disease. I concur with radiologist's interpretation.  Medical tests:    Scheduled Meds: . aspirin EC  81 mg Oral Daily  . docusate sodium  100 mg Oral BID  . enoxaparin (LOVENOX) injection  40 mg Subcutaneous Q24H  . folic acid  1 mg Oral Daily  . gabapentin  100 mg Oral TID  . HYDROmorphone   Intravenous Q4H  . hydroxyurea  1,000 mg Oral Daily  . ketorolac  30 mg Intravenous Q6H  . L-glutamine  5 g Oral BID  . loratadine  10 mg Oral Daily  . metroNIDAZOLE  500 mg Oral BID  . morphine  15 mg Oral Q12H  . potassium chloride  10 mEq Oral Daily  Continuous Infusions:   Principal Problem:   Sickle cell crisis (HCC) Active Problems:   Elevated LFTs   Hyperbilirubinemia   Chronic pain syndrome   Anemia of chronic disease   LOS: 2 days

## 2017-01-04 NOTE — Plan of Care (Signed)
Dr. Text to inform of critical hemo of 6.6.

## 2017-01-04 NOTE — Progress Notes (Signed)
Dr. Shanon Brow paged and made aware of pts oxygen saturation of 84 and HR of 108. Pt awake and alert, and complaining 9/10 pain. New order for 2view CXR in am.

## 2017-01-04 NOTE — Consult Note (Signed)
   [facility] CM Inpatient Consult   01/04/2017  Dawn [name] [birth year] [id number]    [facility] Care Management follow up. Called into patient's room to discuss and offer [facility] Care Management program. Tequila politely declines by saying "no ma'am, I do not need those services".  Patient denies having transition of care, care coordination, disease management, disease monitoring, transportation, community resource, or pharmacy needs at this time. Will make inpatient RNCM and [facility] office aware that Ms. Coonradt declined [place] Management program services. Marthenia Rolling, MSN-Ed, RN,BSN [facility] Liaison [phone]

## 2017-01-04 NOTE — Care Management Note (Signed)
Case Management Note  Patient Details  Name: Dawn [name] MRN: [id number] Date of Birth: 1980-10-21  Subjective/Objective:  Adm with [facility]. Frequent ER visits/ admissions. CM discussed [facility] Emmi calls, patient declines. CSW consult has been placed to assess for triggers, underlying psychosocial issues for readmission. Patient has Medicare, applying for Medicaid.          Action/Plan: Anticipate DC home with self care.   Expected Discharge Date:       01/06/2017           Expected Discharge Plan:  Home/Self Care  In-House Referral:     Discharge planning Services  CM Consult  Post Acute Care Choice:    Choice offered to:  NA  DME Arranged:    DME Agency:     HH Arranged:    HH Agency:     Status of Service:  Completed, signed off  If discussed at [employer] of Stay Meetings, dates discussed:    Additional Comments:  Anjolaoluwa Siguenza, Chauncey Reading, RN 01/04/2017, 11:34 AM

## 2017-01-04 NOTE — Progress Notes (Signed)
Pt coughed up nickel size blood clot, states she does this every time she takes Lovenox. Will continue to monitor pt. Adv to let her doctor know in am.

## 2017-01-04 NOTE — Progress Notes (Signed)
Pts oxygen monitor keeps beeping that sats are 89%. Oxygen probe on finger changed to new one, lung sounds remain clear. Oxygen increased to 3L, HR running 110. Pt c/o pain 9/10. CN made aware. Will continue to monitor pt.

## 2017-01-04 NOTE — Consult Note (Signed)
   [facility] CM Inpatient Consult   01/04/2017  Dawn [name] Feb 06, 1981 [id number]  Received a referral from [facility] main office to reach out to patient related to patient's frequent admits and ED visits. Collaborated with inpatient care manager who noted patient refused [facility] EMMI calls, but felt it was worth a try to reach out to patient in attempt to provide resources. Called into patient's room to explain [facility] Care Management services and the patient stated it was not a good time to talk. Spoke with covering hospital liaison who stated she would attempt a call back in a little while. For questions or concerns please contact:  Jenea Dake RN, [facility] Liaison  [phone]) Business Mobile [phone]) Toll free office

## 2017-01-05 DIAGNOSIS — D696 Thrombocytopenia, unspecified: Secondary | ICD-10-CM

## 2017-01-05 LAB — CBC
HCT: 13.8 % — ABNORMAL LOW (ref 36.0–46.0)
HEMOGLOBIN: 5 g/dL — AB (ref 12.0–15.0)
MCH: 35 pg — ABNORMAL HIGH (ref 26.0–34.0)
MCHC: 36.2 g/dL — AB (ref 30.0–36.0)
MCV: 96.5 fL (ref 78.0–100.0)
PLATELETS: 93 10*3/uL — AB (ref 150–400)
RBC: 1.43 MIL/uL — AB (ref 3.87–5.11)
RDW: 24.1 % — ABNORMAL HIGH (ref 11.5–15.5)
WBC: 12.3 10*3/uL — ABNORMAL HIGH (ref 4.0–10.5)

## 2017-01-05 MED ORDER — PROMETHAZINE HCL 12.5 MG PO TABS
12.5000 mg | ORAL_TABLET | Freq: Four times a day (QID) | ORAL | Status: DC | PRN
  Administered 2017-01-06 – 2017-01-08 (×3): 12.5 mg via ORAL
  Filled 2017-01-05 (×3): qty 1

## 2017-01-05 MED ORDER — FUROSEMIDE 20 MG PO TABS
20.0000 mg | ORAL_TABLET | Freq: Once | ORAL | Status: AC
  Administered 2017-01-05: 20 mg via ORAL
  Filled 2017-01-05: qty 1

## 2017-01-05 MED ORDER — SODIUM CHLORIDE 0.9 % IV SOLN
Freq: Once | INTRAVENOUS | Status: AC
Start: 2017-01-05 — End: 2017-01-05
  Administered 2017-01-05: 20:00:00 via INTRAVENOUS

## 2017-01-05 NOTE — Progress Notes (Signed)
PROGRESS NOTE  Dawn [name] NUU:[id number] DOB: 11/10/1980 DOA: 01/02/2017 PCP: [name], FNP  Brief Narrative: 53yow PMH sickle cell anemia prsented with chest, back, arm and leg pain c/w usual SSA criis pain. Has had 13 admissions last 6 monhts and 27 ED visits last six month. Admitted for SSA pain crisis.  Assessment/Plan 1. SSA pain crisis, pain improved but anemia worsening. Summary chart review: multiple hospitalizaitons, some requiring PRBC transfusion, I do not see information in regard to transfusion criteria. Follows at [facility] once per year, but last note is from 2013.  Pain control improved, 7/10. Continue Dilaudid PCA, chronic narcotics, adjunctive therapy.  2. SCA, acute on chronic, established problem with worsening.  Discussed with patient, recommend transfusion 2 U PRBC, feel benefit outweighs risk of known antibodies, patient agrees.  3. Thrombocytopenia, new problem, has been seen multiple times in past, presume secondary to crisis, no further w/u planned at this point.  Check CBC after transfusion.  4. 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 Stable.  5. 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 Continue long-acting oral narcotic.   DVT prophylaxis: SCDs Code Status: full code Family Communication: none Disposition Plan: home    Murray Hodgkins, MD  [facility] Direct contact: [phone] --[address] [place]  --[email or web address]; password [email or web address]  7PM-7AM contact night coverage as above 01/05/2017, 10:47 AM  LOS: 3 days   Consultants:    Procedures:  Transfusion 2 U PRBC  Antimicrobials:    Interval history/Subjective: Feels better today, pain gone in arms, still has pain in back, ribs, chest as usual. No vomiting today, had oatmeal for breakfast. Breathing fine.  Objective: Vitals:   01/05/17 0220 01/05/17 0359 01/05/17 0620 01/05/17 0823  BP: (!) 142/74  132/72   Pulse: (!) 103  67   Resp: 16 12 16 18   Temp: 98.4 F (36.9 C)   98.1 F (36.7 C)   TempSrc: Oral  Oral   SpO2: 97% 95% 95% 96%  Weight:      Height:        Intake/Output Summary (Last 24 hours) at 01/05/17 1047 Last data filed at 01/05/17 0800  Gross per 24 hour  Intake              306 ml  Output                0 ml  Net              306 ml     Filed Weights   01/02/17 1533 01/02/17 2110  Weight: 76.2 kg (168 lb) 76.2 kg (168 lb)    Exam: Constitutional: appears calm, comfortable Eyes: pupils round, appear normal. Sclera icteric. Irises and lids appear normal ENT: lips appear normal, hearing grossly normal CV: RRR, no m/r/g. 1+ bilateral knee edema. Resp: CTA bilaterally, no w/r/r. Normal respiratory effort. Skin: nor rash noted Psychiatric: grossly normal mood and affect; judgement and insight appear intact.  I have personally reviewed the following:   Labs:  Hgb 6.6 >> 5.0  Plts down to 93  WBC without change 12.3   Imaging studies:    Medical tests:    Scheduled Meds: . sodium chloride   Intravenous Once  . aspirin EC  81 mg Oral Daily  . docusate sodium  100 mg Oral BID  . folic acid  1 mg Oral Daily  . furosemide  20 mg Oral Once  . gabapentin  100 mg Oral TID  . HYDROmorphone   Intravenous Q4H  .  ketorolac  30 mg Intravenous Q6H  . L-glutamine  5 g Oral BID  . loratadine  10 mg Oral Daily  . metroNIDAZOLE  500 mg Oral BID  . morphine  15 mg Oral Q12H  . potassium chloride  10 mEq Oral Daily   Continuous Infusions:   Principal Problem:   Sickle cell crisis (HCC) Active Problems:   Sickle cell disease (HCC)   Elevated LFTs   Hyperbilirubinemia   Thrombocytopenia (HCC)   Chronic pain syndrome   Anemia of chronic disease   LOS: 3 days

## 2017-01-05 NOTE — Progress Notes (Signed)
CRITICAL VALUE STICKER  CRITICAL VALUE: Hgb 5  RECEIVER (on-site recipient of call): PCoppin, [place] NOTIFIED: 4/14 @0825   MESSENGER (representative from lab):  MD NOTIFIED: Sarajane Jews  TIME OF NOTIFICATION: 2256  RESPONSE: pending

## 2017-01-05 NOTE — Progress Notes (Signed)
Blood consent obtained and placed on pt chart.

## 2017-01-06 DIAGNOSIS — G894 Chronic pain syndrome: Secondary | ICD-10-CM

## 2017-01-06 LAB — CBC
HCT: 12.9 % — ABNORMAL LOW (ref 36.0–46.0)
Hemoglobin: 4.5 g/dL — CL (ref 12.0–15.0)
MCH: 34.6 pg — ABNORMAL HIGH (ref 26.0–34.0)
MCHC: 34.9 g/dL (ref 30.0–36.0)
MCV: 99.2 fL (ref 78.0–100.0)
PLATELETS: 82 10*3/uL — AB (ref 150–400)
RBC: 1.3 MIL/uL — AB (ref 3.87–5.11)
RDW: 26.7 % — AB (ref 11.5–15.5)
WBC: 11.7 10*3/uL — AB (ref 4.0–10.5)

## 2017-01-06 LAB — PREPARE RBC (CROSSMATCH)

## 2017-01-06 MED ORDER — SODIUM CHLORIDE 0.9 % IV SOLN
Freq: Once | INTRAVENOUS | Status: AC
  Administered 2017-01-25: 12:00:00 via INTRAVENOUS

## 2017-01-06 NOTE — Progress Notes (Signed)
PROGRESS NOTE  Dawn [name] [id number]:[id number] DOB: [birth year] DOA: 01/02/2017 PCP: [name], FNP  Brief Narrative: 89yow PMH sickle cell anemia prsented with chest, back, arm and leg pain c/w usual SSA criis pain. Has had 13 admissions last 6 monhts and 27 ED visits last six month. Admitted for SSA pain crisis.  Assessment/Plan 1. SSA pain crisis, pain stable at 7/10, but anemia worsening.   Continue hydromorphone PCA. Slow to improve by report, though appears clinically better.  2. SCA, acute on chronic, established problem with worsening.  Hgb lower today. However, hemodynamics stable. Blood delayed by antibodies. Now ready to transfuse  Transfuse 2 units PRBC  Type and cross 2 additional units  3. Thrombocytopenia, new problem, has been seen multiple times in past, presume secondary to crisis.    no bleeding, no rash noted. Continue to follow clinically, repeat CBC in AM.  4. 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    Recheck CMP in AM.  5. 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 Continue long-acting oral narcotic.   Follow closely, transfuse today and prepare 2 extra units. Repeat blood work in AM.  DVT prophylaxis: SCDs Code Status: full code Family Communication: none Disposition Plan: home    Murray Hodgkins, MD  [facility] Direct contact: [phone] --[address] [place]  --[email or web address]; password [email or web address]  7PM-7AM contact night coverage as above 01/06/2017, 9:10 AM  LOS: 4 days   Consultants:    Procedures:  Transfusion 2 U PRBC 4/15 (delayed by antibodies)  Antimicrobials:    Interval history/Subjective: No problems overnight, no new pain, no SOB. LE edema has decreased. No new pain. Pain level the same 7/10.  Objective: Vitals:   01/05/17 2044 01/05/17 2325 01/06/17 0405 01/06/17 0644  BP: 129/72   127/61  Pulse: (!) 101   (!) 106  Resp: 15 16 17 13   Temp: 98.8 F (37.1 C)   98.9 F (37.2 C)  TempSrc: Oral   Oral    SpO2: 99% 95% 95% 97%  Weight:      Height:        Intake/Output Summary (Last 24 hours) at 01/06/17 0910 Last data filed at 01/06/17 0400  Gross per 24 hour  Intake              681 ml  Output                0 ml  Net              681 ml     Filed Weights   01/02/17 1533 01/02/17 2110  Weight: 76.2 kg (168 lb) 76.2 kg (168 lb)    Exam: General: appears calm and comfortable, appears better today CV: RRR no m/r/g. No LE edema. Resp: CTA bilaterally, no w/r/r. Normal respiratory effort. Eyes: pupils, irises, lids appear normal. Sclera icteric. ENT: hearing grossly normal. Lips and tongue appear normal. Psych: mood and affect grossly normal Skin: no rash noted.  I have personally reviewed the following:   Labs:  Hgb 6.6 >> 5.0 >> 4.5  Plts 93 >>82  WBC without significant change 11.7  Imaging studies:    Medical tests:    Scheduled Meds: . aspirin EC  81 mg Oral Daily  . docusate sodium  100 mg Oral BID  . folic acid  1 mg Oral Daily  . gabapentin  100 mg Oral TID  . HYDROmorphone   Intravenous Q4H  . ketorolac  30 mg Intravenous Q6H  . L-glutamine  5  g Oral BID  . loratadine  10 mg Oral Daily  . metroNIDAZOLE  500 mg Oral BID  . morphine  15 mg Oral Q12H  . potassium chloride  10 mEq Oral Daily   Continuous Infusions:   Principal Problem:   Sickle cell crisis (HCC) Active Problems:   Sickle cell disease (HCC)   Elevated LFTs   Hyperbilirubinemia   Thrombocytopenia (HCC)   Chronic pain syndrome   Anemia of chronic disease   LOS: 4 days

## 2017-01-06 NOTE — Progress Notes (Signed)
CRITICAL VALUE ALERT  Critical value received:  Hemoglobin 4.5  Date of notification:  01/06/2017  Time of notification:  0803  Critical value read back:Yes.    Nurse who received alert:  Tacey Heap RN  MD notified (1st page):  Dr Sarajane Jews via Shea [facility]  Time of first page:  [phone]  Notified by lab this AM that blood is now ready to transfuse, notified Dr Sarajane Jews via Shea [facility]

## 2017-01-07 LAB — CBC WITH DIFFERENTIAL/PLATELET
Basophils Absolute: 0 10*3/uL (ref 0.0–0.1)
Basophils Relative: 0 %
Eosinophils Absolute: 0.6 10*3/uL (ref 0.0–0.7)
Eosinophils Relative: 6 %
HCT: 20 % — ABNORMAL LOW (ref 36.0–46.0)
HEMOGLOBIN: 7.7 g/dL — AB (ref 12.0–15.0)
LYMPHS ABS: 1.8 10*3/uL (ref 0.7–4.0)
Lymphocytes Relative: 19 %
MCH: 34.1 pg — AB (ref 26.0–34.0)
MCHC: 37 g/dL — AB (ref 30.0–36.0)
MCV: 93.5 fL (ref 78.0–100.0)
MONOS PCT: 16 %
Monocytes Absolute: 1.5 10*3/uL — ABNORMAL HIGH (ref 0.1–1.0)
NEUTROS ABS: 5.4 10*3/uL (ref 1.7–7.7)
Neutrophils Relative %: 59 %
Platelets: 96 10*3/uL — ABNORMAL LOW (ref 150–400)
RBC: 2.17 MIL/uL — ABNORMAL LOW (ref 3.87–5.11)
RDW: 23.7 % — ABNORMAL HIGH (ref 11.5–15.5)
WBC: 9.3 10*3/uL (ref 4.0–10.5)

## 2017-01-07 LAB — COMPREHENSIVE METABOLIC PANEL
ALBUMIN: 2 g/dL — AB (ref 3.5–5.0)
ALT: 25 U/L (ref 14–54)
AST: 110 U/L — AB (ref 15–41)
Alkaline Phosphatase: 112 U/L (ref 38–126)
Anion gap: 5 (ref 5–15)
BUN: 11 mg/dL (ref 6–20)
CHLORIDE: 108 mmol/L (ref 101–111)
CO2: 25 mmol/L (ref 22–32)
Calcium: 8 mg/dL — ABNORMAL LOW (ref 8.9–10.3)
Creatinine, Ser: 0.45 mg/dL (ref 0.44–1.00)
GFR calc Af Amer: >60 mL/min (ref >60)
GFR calc non Af Amer: >60 mL/min (ref >60)
Glucose, Bld: 125 mg/dL — ABNORMAL HIGH (ref 65–99)
POTASSIUM: 3.5 mmol/L (ref 3.5–5.1)
Sodium: 138 mmol/L (ref 135–145)
Total Bilirubin: 13.7 mg/dL — ABNORMAL HIGH (ref 0.3–1.2)
Total Protein: 6 g/dL — ABNORMAL LOW (ref 6.5–8.1)

## 2017-01-07 LAB — RETICULOCYTES
RBC.: 2.18 MIL/uL — AB (ref 3.87–5.11)
Retic Count, Absolute: 484 10*3/uL — ABNORMAL HIGH (ref 19.0–186.0)
Retic Ct Pct: 22.2 % — ABNORMAL HIGH (ref 0.4–3.1)

## 2017-01-07 MED ORDER — HYDROXYUREA 500 MG PO CAPS
1000.0000 mg | ORAL_CAPSULE | Freq: Every day | ORAL | Status: DC
  Administered 2017-01-07: 1000 mg via ORAL
  Filled 2017-01-07: qty 2

## 2017-01-07 NOTE — Progress Notes (Signed)
PROGRESS NOTE  Dawn [name] OYD:[id number] DOB: 08-Jul-1981 DOA: 01/02/2017 PCP: [name], FNP  Brief Narrative: 37yow PMH sickle cell anemia prsented with chest, back, arm and leg pain c/w usual SSA criis pain. Has had 13 admissions last 6 monhts and 27 ED visits last six month. Admitted for SSA pain crisis.  Assessment/Plan 1. SSA pain crisis, pain lessening, 6-7/10.   Slowly improving, may be turning corner.  Continue hydromorphone PCA.  2. SCA, acute on chronic, established problem with worsening.  Hgb improved appropriately s/p 2 U PRBC 4/15. 2 additional units on hold if needed.  Check CBC in AM  3. Thrombocytopenia, new problem, has been seen multiple times in past, presume secondary to crisis.   Improved today, continue to follow CBC. No further evaluation otherwise.  4. 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 Stable.   5. 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 Continue long-acting oral narcotic.   Some improvement but still requiring PCA. Follow CBC in AM. Home when pain improved and Hgb stable.  DVT prophylaxis: SCDs Code Status: full code Family Communication: none Disposition Plan: home    Murray Hodgkins, MD  [facility] Direct contact: [phone] --[address] [place]  --[email or web address]; password [email or web address]  7PM-7AM contact night coverage as above 01/07/2017, 12:24 PM  LOS: 5 days   Consultants:    Procedures:  Transfusion 2 U PRBC 4/15 (delayed by antibodies)  Antimicrobials:    Interval history/Subjective: Feeling a little better today, pain a little less (6-7/10). Breathing ok. A little LE edema. Eating ok but appetite lacking. Went for a walk.  Objective: Vitals:   01/07/17 0826 01/07/17 0846 01/07/17 1151 01/07/17 1211  BP: 130/71  129/81   Pulse: 93  88   Resp: 18 14 17 18   Temp: 98.7 F (37.1 C)  98.7 F (37.1 C)   TempSrc: Oral  Oral   SpO2: 97% 99% 100% 100%  Weight:      Height:        Intake/Output  Summary (Last 24 hours) at 01/07/17 1224 Last data filed at 01/07/17 0700  Gross per 24 hour  Intake             1175 ml  Output                0 ml  Net             1175 ml     Filed Weights   01/02/17 1533 01/02/17 2110  Weight: 76.2 kg (168 lb) 76.2 kg (168 lb)    Exam: General: appears better today, appears calm & comfortable CV: RRR no m/r/g. 1+ BLE edema. Respiratory: CTA bilaterally, no w/r/r. Normal respiratory effort. Psychiatric: grossly normal mood and affect, speech fluent and appropriate.  I have personally reviewed the following:   Labs:  Hgb 4.5 >> 7.7  Plts 93 >>82 >> 96  WBC 9.3  CMP: stable. Normal creatinine. T bili stable 13.7  Retic count 484  Imaging studies:    Medical tests:    Scheduled Meds: . sodium chloride   Intravenous Once  . aspirin EC  81 mg Oral Daily  . docusate sodium  100 mg Oral BID  . folic acid  1 mg Oral Daily  . gabapentin  100 mg Oral TID  . HYDROmorphone   Intravenous Q4H  . hydroxyurea  1,000 mg Oral Daily  . L-glutamine  5 g Oral BID  . loratadine  10 mg Oral Daily  . metroNIDAZOLE  500 mg  Oral BID  . morphine  15 mg Oral Q12H  . potassium chloride  10 mEq Oral Daily   Continuous Infusions:   Principal Problem:   Sickle cell crisis (HCC) Active Problems:   Sickle cell disease (HCC)   Elevated LFTs   Hyperbilirubinemia   Thrombocytopenia (HCC)   Chronic pain syndrome   Anemia of chronic disease   LOS: 5 days

## 2017-01-08 ENCOUNTER — Inpatient Hospital Stay (HOSPITAL_COMMUNITY): Payer: Medicare Other

## 2017-01-08 DIAGNOSIS — R509 Fever, unspecified: Secondary | ICD-10-CM

## 2017-01-08 LAB — CBC
HEMATOCRIT: 20.2 % — AB (ref 36.0–46.0)
HEMOGLOBIN: 7 g/dL — AB (ref 12.0–15.0)
MCH: 32.7 pg (ref 26.0–34.0)
MCHC: 34.7 g/dL (ref 30.0–36.0)
MCV: 94.4 fL (ref 78.0–100.0)
Platelets: 93 10*3/uL — ABNORMAL LOW (ref 150–400)
RBC: 2.14 MIL/uL — AB (ref 3.87–5.11)
RDW: 25 % — ABNORMAL HIGH (ref 11.5–15.5)
WBC: 9.2 10*3/uL (ref 4.0–10.5)

## 2017-01-08 MED ORDER — HYDROMORPHONE 1 MG/ML IV SOLN
INTRAVENOUS | Status: DC
Start: 2017-01-08 — End: 2017-01-19
  Administered 2017-01-09: 3.6 mg via INTRAVENOUS
  Administered 2017-01-09 – 2017-01-10 (×2): via INTRAVENOUS
  Administered 2017-01-11: 1 mg via INTRAVENOUS
  Administered 2017-01-12 – 2017-01-13 (×2): via INTRAVENOUS
  Administered 2017-01-14: 7.2 mg via INTRAVENOUS
  Administered 2017-01-14: 7.8 mg via INTRAVENOUS
  Administered 2017-01-14 – 2017-01-15 (×2): via INTRAVENOUS
  Administered 2017-01-15: 3.59 mg via INTRAVENOUS
  Administered 2017-01-15: 7.2 mg via INTRAVENOUS
  Administered 2017-01-15: 4.99 mg via INTRAVENOUS
  Administered 2017-01-16: 6 mg via INTRAVENOUS
  Administered 2017-01-16 (×2): via INTRAVENOUS
  Administered 2017-01-16: 7.2 mg via INTRAVENOUS
  Administered 2017-01-16 – 2017-01-17 (×2): 6.6 mg via INTRAVENOUS
  Administered 2017-01-17: 4.8 mg via INTRAVENOUS
  Administered 2017-01-17: 4.2 mg via INTRAVENOUS
  Administered 2017-01-17 – 2017-01-18 (×2): via INTRAVENOUS
  Filled 2017-01-08 (×11): qty 25

## 2017-01-08 MED ORDER — FUROSEMIDE 10 MG/ML IJ SOLN
20.0000 mg | Freq: Once | INTRAMUSCULAR | Status: AC
  Administered 2017-01-08: 20 mg via INTRAVENOUS
  Filled 2017-01-08: qty 2

## 2017-01-08 MED ORDER — HYDROMORPHONE 1 MG/ML IV SOLN
INTRAVENOUS | Status: DC
  Administered 2017-01-08: 14.9 mg via INTRAVENOUS
  Filled 2017-01-08: qty 25

## 2017-01-08 MED ORDER — FUROSEMIDE 10 MG/ML IJ SOLN
40.0000 mg | Freq: Once | INTRAMUSCULAR | Status: AC
  Administered 2017-01-08: 40 mg via INTRAVENOUS
  Filled 2017-01-08: qty 4

## 2017-01-08 NOTE — Progress Notes (Signed)
Patient c/o increased pain and shortness of breath. Fever 101, HR 108 Sat 98 Dr. Sarajane Jews notified.

## 2017-01-08 NOTE — Care Management Note (Signed)
Case Management Note  Patient Details  Name: Dawn [name] MRN: [id number] Date of Birth: June 11, 1981    If discussed at Long Length of Stay Meetings, dates discussed:   01/08/2017 Additional Comments:  Demetrick Eichenberger, Chauncey Reading, RN 01/08/2017, 11:31 AM

## 2017-01-08 NOTE — Progress Notes (Addendum)
PROGRESS NOTE  Dawn [name] RKY:[id number] DOB: October 08, 1980 DOA: 01/02/2017 PCP: [name], FNP  Brief Narrative: 29yow PMH sickle cell anemia prsented with chest, back, arm and leg pain c/w usual SSA criis pain. Has had 13 admissions last 6 monhts and 27 ED visits last six month. Admitted for SSA pain crisis. Required 2 units PRBC, hospitalization prolonged by ongoing pain.   Assessment/Plan 1. SSA pain crisis, pain 7/10, slightly worse today; usual pain, no changes or new location.  Poor pain control. Adjust PCA.  2. SCA, acute on chronic, established problem with worsening.  Hgb improved appropriately s/p 2 U PRBC 4/15.   Slightly lower today.  Check CBC in AM given overall failure to improve  Discussed with SS specialist Dr. Zigmund Daniel 4/16 by telephone, recommended against transfusion at that time.  Has antibodies so crossmatch takes some time. Will check CBC in AM. If significantly lower, will proceed with crossmatch then.  3. Fever. Suspect atelectasis given shallow breaths, normal lung exam. No localizing symptoms.  Check u/a, CXR, blood cultures.  Empiric abx if condition worsens or fever recurs  4. Thrombocytopenia, new problem, has been seen multiple times in past, presume secondary to crisis.   Stable. Continue to follow.  5. 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  6.  7. 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 Continue long-acting oral narcotic.   Worse today, now with fever. No change in pain. No localizing symptoms. No history to suggest acute chest.   Workup as above.  Spoke with Dr. Zigmund Daniel, recommended changes to PCA as follows: decrease demand to 0.6, keep 10 minute lockout, increase 1 hour max dose to 3.6.  DVT prophylaxis: SCDs Code Status: full code Family Communication: none Disposition Plan: home    Murray Hodgkins, MD  [facility] Direct contact: [phone] --[address] [place]  --[email or web address]; password [email or web address]    7PM-7AM contact night coverage as above 01/08/2017, 3:06 PM  LOS: 6 days   Consultants:    Procedures:  Transfusion 2 U PRBC 4/15 (delayed by antibodies)  Antimicrobials:    Interval history/Subjective: Fever this afternoon, SOB, increased swelling of legs. Pain about the same in locations and intensity 7/10. No new chest pain. DOE, relieved wih rest.  Objective: Vitals:   01/08/17 0329 01/08/17 0444 01/08/17 0843 01/08/17 1300  BP:  138/76 (!) 147/89 (!) 158/90  Pulse:  93 99 (!) 107  Resp: 17 18 17  (!) 21  Temp:  99.1 F (37.3 C) 98.4 F (36.9 C) (!) 101 F (38.3 C)  TempSrc:  Oral Oral Oral  SpO2: 98% 96% 98% 99%  Weight:      Height:       No intake or output data in the 24 hours ending 01/08/17 1506   Filed Weights   01/02/17 1533 01/02/17 2110  Weight: 76.2 kg (168 lb) 76.2 kg (168 lb)    Exam: General: appears worse today, short of breath after ambulating, in pain Eyes: pupils, irises, lids appear normal. Sclera icterus noted. CV: RRR no m/r/g. 2+ BLE edema. Resp: CTA bilaterally, no w/r/r. Mild increased resp effort, speaks in full sentences. Skin: no rash or induration noted. nontender. ENT: lips and tongue appear normal   I have personally reviewed the following:   Labs:  Hgb 4.5 >> 7.7 >> 7.0  Plts 93 >>82 >> 96 >> 93  WBC 9.2  Imaging studies:    Medical tests:    Scheduled Meds: . aspirin EC  81 mg Oral Daily  . docusate  sodium  100 mg Oral BID  . folic acid  1 mg Oral Daily  . gabapentin  100 mg Oral TID  . HYDROmorphone   Intravenous Q4H  . L-glutamine  5 g Oral BID  . loratadine  10 mg Oral Daily  . metroNIDAZOLE  500 mg Oral BID  . morphine  15 mg Oral Q12H  . potassium chloride  10 mEq Oral Daily   Continuous Infusions: . sodium chloride 250 mL (01/07/17 0935)  . sodium chloride 10 mL/hr at 01/06/17 1314    Principal Problem:   Sickle cell crisis (HCC) Active Problems:   Sickle cell disease (HCC)   Elevated  LFTs   Hyperbilirubinemia   Thrombocytopenia (HCC)   Chronic pain syndrome   Anemia of chronic disease   LOS: 6 days

## 2017-01-08 NOTE — Progress Notes (Signed)
Port flushes easily but will not allow withdrawal of blood despite patient coughing and repositioning. Dr. Sarajane Jews paged.

## 2017-01-09 DIAGNOSIS — B9561 Methicillin susceptible Staphylococcus aureus infection as the cause of diseases classified elsewhere: Secondary | ICD-10-CM | POA: Diagnosis present

## 2017-01-09 DIAGNOSIS — Z95828 Presence of other vascular implants and grafts: Secondary | ICD-10-CM

## 2017-01-09 DIAGNOSIS — R7881 Bacteremia: Secondary | ICD-10-CM | POA: Diagnosis present

## 2017-01-09 LAB — URINALYSIS, ROUTINE W REFLEX MICROSCOPIC
Bilirubin Urine: NEGATIVE
Glucose, UA: NEGATIVE mg/dL
Ketones, ur: NEGATIVE mg/dL
Leukocytes, UA: NEGATIVE
Nitrite: NEGATIVE
PH: 5 (ref 5.0–8.0)
PROTEIN: NEGATIVE mg/dL
RBC / HPF: NONE SEEN RBC/hpf (ref 0–5)
Specific Gravity, Urine: 1.004 — ABNORMAL LOW (ref 1.005–1.030)

## 2017-01-09 LAB — TYPE AND SCREEN
ABO/RH(D): A POS
Antibody Screen: NEGATIVE
UNIT DIVISION: 0
UNIT DIVISION: 0
UNIT DIVISION: 0
Unit division: 0

## 2017-01-09 LAB — BLOOD CULTURE ID PANEL (REFLEXED)
ACINETOBACTER BAUMANNII: NOT DETECTED
CANDIDA KRUSEI: NOT DETECTED
Candida albicans: NOT DETECTED
Candida glabrata: NOT DETECTED
Candida parapsilosis: NOT DETECTED
Candida tropicalis: NOT DETECTED
ENTEROCOCCUS SPECIES: NOT DETECTED
ESCHERICHIA COLI: NOT DETECTED
Enterobacter cloacae complex: NOT DETECTED
Enterobacteriaceae species: NOT DETECTED
HAEMOPHILUS INFLUENZAE: NOT DETECTED
Klebsiella oxytoca: NOT DETECTED
Klebsiella pneumoniae: NOT DETECTED
LISTERIA MONOCYTOGENES: NOT DETECTED
METHICILLIN RESISTANCE: NOT DETECTED
NEISSERIA MENINGITIDIS: NOT DETECTED
PSEUDOMONAS AERUGINOSA: NOT DETECTED
Proteus species: NOT DETECTED
SERRATIA MARCESCENS: NOT DETECTED
STAPHYLOCOCCUS AUREUS BCID: DETECTED — AB
STREPTOCOCCUS AGALACTIAE: NOT DETECTED
STREPTOCOCCUS SPECIES: NOT DETECTED
Staphylococcus species: DETECTED — AB
Streptococcus pneumoniae: NOT DETECTED
Streptococcus pyogenes: NOT DETECTED

## 2017-01-09 LAB — BPAM RBC
BLOOD PRODUCT EXPIRATION DATE: 201805012359
BLOOD PRODUCT EXPIRATION DATE: 201805022359
Blood Product Expiration Date: 201805012359
Blood Product Expiration Date: 201805042359
ISSUE DATE / TIME: 201804150934
ISSUE DATE / TIME: 201804151458
UNIT TYPE AND RH: 6200
UNIT TYPE AND RH: 6200
Unit Type and Rh: 6200
Unit Type and Rh: 6200

## 2017-01-09 MED ORDER — VANCOMYCIN HCL 10 G IV SOLR
1500.0000 mg | Freq: Once | INTRAVENOUS | Status: AC
  Administered 2017-01-09: 1500 mg via INTRAVENOUS
  Filled 2017-01-09: qty 1500

## 2017-01-09 MED ORDER — PROMETHAZINE HCL 25 MG/ML IJ SOLN
12.5000 mg | Freq: Four times a day (QID) | INTRAMUSCULAR | Status: DC | PRN
  Administered 2017-01-09 – 2017-01-25 (×44): 12.5 mg via INTRAVENOUS
  Filled 2017-01-09 (×46): qty 1

## 2017-01-09 MED ORDER — KETOROLAC TROMETHAMINE 30 MG/ML IJ SOLN
30.0000 mg | Freq: Four times a day (QID) | INTRAMUSCULAR | Status: DC
  Administered 2017-01-09 – 2017-01-13 (×14): 30 mg via INTRAVENOUS
  Filled 2017-01-09 (×14): qty 1

## 2017-01-09 MED ORDER — CEFAZOLIN SODIUM-DEXTROSE 1-4 GM-% IV SOLR
2.0000 g | Freq: Three times a day (TID) | INTRAVENOUS | Status: DC
  Filled 2017-01-09 (×3): qty 100

## 2017-01-09 MED ORDER — CEFAZOLIN SODIUM 1 G IJ SOLR
2.0000 g | Freq: Three times a day (TID) | INTRAMUSCULAR | Status: AC
  Administered 2017-01-09 – 2017-01-21 (×35): 2 g via INTRAVENOUS
  Filled 2017-01-09 (×39): qty 20

## 2017-01-09 MED ORDER — VANCOMYCIN HCL IN DEXTROSE 750-5 MG/150ML-% IV SOLN
750.0000 mg | Freq: Three times a day (TID) | INTRAVENOUS | Status: DC
  Filled 2017-01-09 (×3): qty 150

## 2017-01-09 NOTE — Progress Notes (Signed)
Pt complaining of cramping pain under ribs, vitals stable, MD made aware, no orders received. Will continue to monitor.

## 2017-01-09 NOTE — Consult Note (Signed)
[facility] Aut[facility]onsult for MSSA bacteremia.  She developed fever on 4/17 and blood cultures sent with 1/2 so far positive for MSSA.  Vancomycin started.  Has a port a cath.    Recommend: 1) change to cefazolin 2) TTE and TEE if negative TTE 3) will need to remove line 4) repeat blood cultures after cath removal Will need to treat with IV cefazolin for 2 or 6 weeks, depending on #2 and if blood cultures clear  Thanks Scharlene Gloss, MD

## 2017-01-09 NOTE — Progress Notes (Signed)
PHARMACY - PHYSICIAN COMMUNICATION CRITICAL VALUE ALERT - BLOOD CULTURE IDENTIFICATION (BCID)  Results for orders placed or performed during the hospital encounter of 01/02/17  Blood Culture ID Panel (Reflexed) (Collected: 01/08/2017  3:14 PM)  Result Value Ref Range   Enterococcus species NOT DETECTED NOT DETECTED   Listeria monocytogenes NOT DETECTED NOT DETECTED   Staphylococcus species DETECTED (A) NOT DETECTED   Staphylococcus aureus DETECTED (A) NOT DETECTED   Methicillin resistance NOT DETECTED NOT DETECTED   Streptococcus species NOT DETECTED NOT DETECTED   Streptococcus agalactiae NOT DETECTED NOT DETECTED   Streptococcus pneumoniae NOT DETECTED NOT DETECTED   Streptococcus pyogenes NOT DETECTED NOT DETECTED   Acinetobacter baumannii NOT DETECTED NOT DETECTED   Enterobacteriaceae species NOT DETECTED NOT DETECTED   Enterobacter cloacae complex NOT DETECTED NOT DETECTED   Escherichia coli NOT DETECTED NOT DETECTED   Klebsiella oxytoca NOT DETECTED NOT DETECTED   Klebsiella pneumoniae NOT DETECTED NOT DETECTED   Proteus species NOT DETECTED NOT DETECTED   Serratia marcescens NOT DETECTED NOT DETECTED   Haemophilus influenzae NOT DETECTED NOT DETECTED   Neisseria meningitidis NOT DETECTED NOT DETECTED   Pseudomonas aeruginosa NOT DETECTED NOT DETECTED   Candida albicans NOT DETECTED NOT DETECTED   Candida glabrata NOT DETECTED NOT DETECTED   Candida krusei NOT DETECTED NOT DETECTED   Candida parapsilosis NOT DETECTED NOT DETECTED   Candida tropicalis NOT DETECTED NOT DETECTED    Name of physician (or Provider) Contacted: Dr. Roderic Palau  Changes to prescribed antibiotics required: 36 yo with positive blood cx. BCID shows MSSA. Discussed with Dr. Roderic Palau and will change empiric vancomycin to cefazolin 2gm IV q8h  Isac Sarna, BS [place], [place] Clinical Pharmacist Pager [phone] 01/09/2017  12:01 PM

## 2017-01-09 NOTE — Progress Notes (Addendum)
Discussed possible TEE with primary team for recurrent bacteremia. We will f/u her TTE that is ordered. TEE would need to be coordinated with anesthesia given her high tolerance for sedative medications. She would also need to be able to lay on her side for 30 minutes from a pain standpoint. We have gone ahead and scheduled TEE for Friday at 12pm with anesthesia   Zandra Abts MD

## 2017-01-09 NOTE — Progress Notes (Signed)
PROGRESS NOTE    Dawn [name]  DHR:[id number] DOB: [birth year] DOA: 01/02/2017 PCP: [name], FNP    Brief Narrative:  36 year old female with history of sickle cell anemia, presented with sickle cell crisis. She's had 13 admissions in the past 6 months and 27 ED visits in the last 6 months. She was admitted for sickle cell anemia pain crisis. She is currently on a Dilaudid PCA. She's been transfused 2 units of PRBCs for worsening anemia. Hospital course complicated by development of fever and evidence of staph aureus bacteremia. Port-A-Cath that was placed earlier this month will need to be removed. She isn't undergoing further workup including echocardiogram and possible TEE.   Assessment & Plan:   Principal Problem:   Sickle cell crisis (HCC) Active Problems:   Sickle cell disease (HCC)   Elevated LFTs   Hyperbilirubinemia   Thrombocytopenia (HCC)   Chronic pain syndrome   Anemia of chronic disease  1. Sickle cell pain crisis. Continues to have uncontrolled pain, although this is mildly better since adjustment of PCA. Will add Toradol to pain management. She is continued on her long-acting narcotics.  2. Sickle cell anemia. Hemoglobin has trended slightly lower today. She's already received 2 units PRBCs on 4/15. We'll recheck type and screen today.  3. Staph aureus bacteremia. Hospital course has been complicated by development of fever. Chest x-ray did not show any significant findings. Blood cultures have returned positive for MSSA bacteremia. She is undergoing 2-D echocardiogram and if negative will likely undergo transesophageal echocardiogram. I have informed interventional radiology who placed Port-A-Cath earlier this month. Recommendations from interventional radiology as well as infectious disease are to have general surgery remove Port-A-Cath. She will need a prolonged course of intravenous antibiotics. She does not have any other clear source of bacteremia.  4.  Thrombocytopenia. Possibly related to crisis. Continue to follow.  5. Chronic hyperbilirubinemia. Likely secondary to hemolysis. Continue to monitor.   DVT prophylaxis: SCDs Code Status: Full code Family Communication: Discussed with mother at the bedside Disposition Plan: Discharge home once improved.   Consultants:   Cardiology for TEE   general surgery for Port-A-Cath removal  Infectious disease (remote consult)  Procedures:     Antimicrobials:   Ancef or/18    Subjective: Continues to have pain in back, arms and legs. Consistent with sickle cell pain.  Objective: Vitals:   01/09/17 1036 01/09/17 1505 01/09/17 1552 01/09/17 1641  BP:  137/67    Pulse:  (!) 115    Resp: 13 16 17    Temp:  100 F (37.8 C)  (!) 100.5 F (38.1 C)  TempSrc:  Oral  Oral  SpO2: 99% 98% 97%   Weight:      Height:        Intake/Output Summary (Last 24 hours) at 01/09/17 1908 Last data filed at 01/09/17 1700  Gross per 24 hour  Intake          1951.47 ml  Output                0 ml  Net          1951.47 ml   Filed Weights   01/02/17 1533 01/02/17 2110  Weight: 76.2 kg (168 lb) 76.2 kg (168 lb)    Examination:  General exam: Appears calm and comfortable  Respiratory system: Clear to auscultation. Respiratory effort normal. Cardiovascular system: S1 & S2 heard, RRR. No JVD, murmurs, rubs, gallops or clicks. No pedal edema. Gastrointestinal system: Abdomen is nondistended,  soft and nontender. No organomegaly or masses felt. Normal bowel sounds heard. Central nervous system: Alert and oriented. No focal neurological deficits. Extremities: Symmetric 5 x 5 power. Skin: No rashes, lesions or ulcers Psychiatry: Judgement and insight appear normal. Mood & affect appropriate.     Data Reviewed: I have personally reviewed following labs and imaging studies  CBC:  Recent Labs Lab 01/03/17 0438 01/05/17 0604 01/06/17 0539 01/07/17 0630 01/08/17 0406  WBC 12.6* 12.3* 11.7*  9.3 9.2  NEUTROABS  --   --   --  5.4  --   HGB 6.6* 5.0* 4.5* 7.7* 7.0*  HCT 17.8* 13.8* 12.9* 20.0* 20.2*  MCV 97.3 96.5 99.2 93.5 94.4  PLT 123* 93* 82* 96* 93*   Basic Metabolic Panel:  Recent Labs Lab 01/03/17 0438 01/07/17 0630  NA 136 138  K 3.0* 3.5  CL 106 108  CO2 25 25  GLUCOSE 123* 125*  BUN 8 11  CREATININE 0.59 0.45  CALCIUM 7.7* 8.0*   GFR: Estimated Creatinine Clearance: 95 mL/min (by C-G formula based on SCr of 0.45 mg/dL). Liver Function Tests:  Recent Labs Lab 01/07/17 0630  AST 110*  ALT 25  ALKPHOS 112  BILITOT 13.7*  PROT 6.0*  ALBUMIN 2.0*   No results for input(s): LIPASE, AMYLASE in the last 168 hours. No results for input(s): AMMONIA in the last 168 hours. Coagulation Profile: No results for input(s): INR, PROTIME in the last 168 hours. Cardiac Enzymes: No results for input(s): CKTOTAL, CKMB, CKMBINDEX, TROPONINI in the last 168 hours. BNP (last 3 results) No results for input(s): PROBNP in the last 8760 hours. HbA1C: No results for input(s): HGBA1C in the last 72 hours. CBG: No results for input(s): GLUCAP in the last 168 hours. Lipid Profile: No results for input(s): CHOL, HDL, LDLCALC, TRIG, CHOLHDL, LDLDIRECT in the last 72 hours. Thyroid Function Tests: No results for input(s): TSH, T4TOTAL, FREET4, T3FREE, THYROIDAB in the last 72 hours. Anemia Panel:  Recent Labs  01/07/17 0630  RETICCTPCT 22.2*   Sepsis Labs: No results for input(s): PROCALCITON, LATICACIDVEN in the last 168 hours.  Recent Results (from the past 240 hour(s))  Culture, blood (Routine X 2) w Reflex to ID Panel     Status: None (Preliminary result)   Collection Time: 01/08/17  3:14 PM  Result Value Ref Range Status   Specimen Description PORTA CATH  Final   Special Requests   Final    BOTTLES DRAWN AEROBIC ONLY Blood Culture adequate volume   Culture  Setup Time   Final    GRAM POSITIVE COCCI IN CLUSTERS Gram Stain Report Called to,Read Back By and  Verified With: TETREAULT,H AT 0700 BY HUFFINES,S ON 01/09/17. Organism ID to follow Performed at [facility], [place] [address], [place], [place] [address]    Culture GRAM POSITIVE COCCI  Final   Report Status PENDING  Incomplete  Blood Culture ID Panel (Reflexed)     Status: Abnormal   Collection Time: 01/08/17  3:14 PM  Result Value Ref Range Status   Enterococcus species NOT DETECTED NOT DETECTED Final   Listeria monocytogenes NOT DETECTED NOT DETECTED Final   Staphylococcus species DETECTED (A) NOT DETECTED Final    Comment: CRITICAL RESULT CALLED TO, READ BACK BY AND VERIFIED WITH: L. Seay Pharm.D. 10:45 01/09/17 ([email or web address])    Staphylococcus aureus DETECTED (A) NOT DETECTED Final    Comment: Methicillin (oxacillin) susceptible Staphylococcus aureus (MSSA). Preferred therapy is anti staphylococcal beta lactam antibiotic (Cefazolin or  Nafcillin), unless clinically contraindicated. CRITICAL RESULT CALLED TO, READ BACK BY AND VERIFIED WITH: L. Seay Pharm.D. 10:45 01/09/17 ([email or web address])    Methicillin resistance NOT DETECTED NOT DETECTED Final   Streptococcus species NOT DETECTED NOT DETECTED Final   Streptococcus agalactiae NOT DETECTED NOT DETECTED Final   Streptococcus pneumoniae NOT DETECTED NOT DETECTED Final   Streptococcus pyogenes NOT DETECTED NOT DETECTED Final   Acinetobacter baumannii NOT DETECTED NOT DETECTED Final   Enterobacteriaceae species NOT DETECTED NOT DETECTED Final   Enterobacter cloacae complex NOT DETECTED NOT DETECTED Final   Escherichia coli NOT DETECTED NOT DETECTED Final   Klebsiella oxytoca NOT DETECTED NOT DETECTED Final   Klebsiella pneumoniae NOT DETECTED NOT DETECTED Final   Proteus species NOT DETECTED NOT DETECTED Final   Serratia marcescens NOT DETECTED NOT DETECTED Final   Haemophilus influenzae NOT DETECTED NOT DETECTED Final   Neisseria meningitidis NOT DETECTED NOT DETECTED Final   Pseudomonas aeruginosa NOT DETECTED NOT DETECTED Final    Candida albicans NOT DETECTED NOT DETECTED Final   Candida glabrata NOT DETECTED NOT DETECTED Final   Candida krusei NOT DETECTED NOT DETECTED Final   Candida parapsilosis NOT DETECTED NOT DETECTED Final   Candida tropicalis NOT DETECTED NOT DETECTED Final    Comment: Performed at [facility], [address] [place], [place] [address]  Culture, blood (Routine X 2) w Reflex to ID Panel     Status: None (Preliminary result)   Collection Time: 01/08/17  3:29 PM  Result Value Ref Range Status   Specimen Description BLOOD RIGHT HAND  Final   Special Requests   Final    BOTTLES DRAWN AEROBIC ONLY Blood Culture results may not be optimal due to an inadequate volume of blood received in culture bottles   Culture  Setup Time   Final    GRAM POSITIVE COCCI IN CLUSTERS Gram Stain Report Called to,Read Back By and Verified With: [facility] @ 1427 ON 4.18.18 BY BAYSE,L AEROBIC BOTTLE ONLY CRITICAL VALUE NOTED.  VALUE IS CONSISTENT WITH PREVIOUSLY REPORTED AND CALLED VALUE. Performed at [facility], [place] [address] [place], [place] [address]    Culture NO GROWTH < 24 HOURS  Final   Report Status PENDING  Incomplete         Radiology Studies: Dg Chest Port 1 View  Result Date: 01/08/2017 CLINICAL DATA:  Fever.  Short of breath.  Sickle cell crisis. EXAM: PORTABLE CHEST 1 VIEW COMPARISON:  01/04/2017 FINDINGS: Right internal jugular power port is unchanged with tip in the SVC 2 cm above the right atrium. Chronic mild cardiomegaly. Chronic pulmonary vascular plethora. No evidence of infiltrate, effusion or collapse. Chronic AVN humeral heads. IMPRESSION: Chronic cardiomegaly and pulmonary vascular plethora. No acute or focal findings otherwise. Electronically Signed   By: Nelson Chimes M.D.   On: 01/08/2017 15:28        Scheduled Meds: . aspirin EC  81 mg Oral Daily  . docusate sodium  100 mg Oral BID  . folic acid  1 mg Oral Daily  . gabapentin  100 mg Oral TID  . HYDROmorphone    Intravenous Q4H  . ketorolac  30 mg Intravenous Q6H  . L-glutamine  5 g Oral BID  . loratadine  10 mg Oral Daily  . metroNIDAZOLE  500 mg Oral BID  . morphine  15 mg Oral Q12H  . potassium chloride  10 mEq Oral Daily   Continuous Infusions: . sodium chloride 250 mL (01/09/17 1543)  .  sodium chloride 10 mL/hr at 01/09/17 1543  . ceFAZolin (ANCEF) IVPB (doses >1 g)       LOS: 7 days    Time spent: 28mins    Lutricia Widjaja, MD [facility] Pager [phone]  If 7PM-7AM, please contact night-coverage [email or web address] Password [facility] 01/09/2017, 7:08 PM

## 2017-01-09 NOTE — Progress Notes (Signed)
Pharmacy Antibiotic Note  Dawn [name] is a 35 y.o. female admitted on 01/02/2017 with bacteremia.  Pharmacy has been consulted for Vancomycin dosing.  Plan: Vancomycin 1500mg  IV loading dose, then 750mg  IV q8h. Goal trough 15-66mcg/ml F/U cxs and clinical progress Monitor V/S, labs, and levels as indicated  Height: 5\' 3"  (160 cm) Weight: 168 lb (76.2 kg) IBW/kg (Calculated) : 52.4  Temp (24hrs), Avg:100.7 F (38.2 C), Min:98.4 F (36.9 C), Max:103.1 F (39.5 C)   Recent Labs Lab 01/02/17 1626 01/03/17 0438 01/05/17 0604 01/06/17 0539 01/07/17 0630 01/08/17 0406  WBC 13.3* 12.6* 12.3* 11.7* 9.3 9.2  CREATININE 0.67 0.59  --   --  0.45  --     Estimated Creatinine Clearance: 95 mL/min (by C-G formula based on SCr of 0.45 mg/dL).    Allergies  Allergen Reactions  . Desferal [Deferoxamine] Hives    Local reaction on arm only during infusion. Can take with benadryl   . Latex Other (See Comments)    REACTION: Pt experiences a burning sensation on contacted skin areas  . Lisinopril Other (See Comments) and Cough    REACTION: Sore/scratchy throat  . Tape Other (See Comments)    REACTION: Pt. Experiences a burning sensation on contacted skin areas  . Tylenol [Acetaminophen] Other (See Comments)    Pt. Does not take the following due to protection of kidney health. Past occurrence of Protein in urine.    Antimicrobials this admission: Vancomycin 4/18 >>   Dose adjustments this admission: N/A  Microbiology results: 4/17 BCx: 1 of 2 + GPC  (porta cath) 4/5 UCx: insignificant growth  Thank you for allowing pharmacy to be a part of this patient's care.  Isac Sarna, BS Pharm D, BCPS Clinical Pharmacist Pager [phone] 01/09/2017 9:30 AM

## 2017-01-09 NOTE — Progress Notes (Signed)
Pt's blood cultures resulted positive for gram + cocci and clusters. MD made aware.

## 2017-01-10 ENCOUNTER — Inpatient Hospital Stay (HOSPITAL_COMMUNITY): Payer: Medicare Other

## 2017-01-10 DIAGNOSIS — R7881 Bacteremia: Secondary | ICD-10-CM

## 2017-01-10 LAB — ECHOCARDIOGRAM COMPLETE
AO mean calculated velocity dopler: 151 cm/s
AOPV: 0.78 m/s
AV Area VTI index: 1.17 cm2/m2
AV Peak grad: 21 mmHg
AV VEL mean LVOT/AV: 0.75
AV peak Index: 1.2
AV pk vel: 231 cm/s
AV vel: 2.19
AVAREAMEANV: 2.14 cm2
AVAREAMEANVIN: 1.15 cm2/m2
AVAREAVTI: 2.23 cm2
AVG: 11 mmHg
CHL CUP MV DEC (S): 384
CHL CUP TV REG PEAK VELOCITY: 290 cm/s
E decel time: 384 msec
FS: 45 % — AB (ref 28–44)
HEIGHTINCHES: 63 in
IVS/LV PW RATIO, ED: 0.99
LA ID, A-P, ES: 52 mm
LA diam end sys: 52 mm
LA diam index: 2.79 cm/m2
LA vol A4C: 139 ml
LA vol index: 59.5 mL/m2
LA vol: 111 mL
LDCA: 2.84 cm2
LV PW d: 10.4 mm — AB (ref 0.6–1.1)
LV SIMPSON'S DISK: 64
LV dias vol index: 60 mL/m2
LVDIAVOL: 112 mL — AB (ref 46–106)
LVOT SV: 107 mL
LVOT VTI: 37.6 cm
LVOT diameter: 19 mm
LVOT peak grad rest: 13 mmHg
LVOTPV: 181 cm/s
LVOTVTI: 0.77 cm
LVSYSVOL: 40 mL (ref 14–42)
LVSYSVOLIN: 22 mL/m2
Lateral S' vel: 18.3 cm/s
MV Peak grad: 9 mmHg
MV pk E vel: 147 m/s
RV TAPSE: 30.9 mm
RV sys press: 37 mmHg
Stroke v: 72 ml
TRMAXVEL: 290 cm/s
VTI: 48.7 cm
Valve area index: 1.17
Valve area: 2.19 cm2
WEIGHTICAEL: 2688 [oz_av]

## 2017-01-10 LAB — CBC
HCT: 19.2 % — ABNORMAL LOW (ref 36.0–46.0)
Hemoglobin: 6.7 g/dL — CL (ref 12.0–15.0)
MCH: 34.3 pg — ABNORMAL HIGH (ref 26.0–34.0)
MCHC: 35.4 g/dL (ref 30.0–36.0)
MCV: 97 fL (ref 78.0–100.0)
PLATELETS: 86 10*3/uL — AB (ref 150–400)
RBC: 1.98 MIL/uL — AB (ref 3.87–5.11)
RDW: 22.7 % — ABNORMAL HIGH (ref 11.5–15.5)
WBC: 15 10*3/uL — AB (ref 4.0–10.5)

## 2017-01-10 LAB — BASIC METABOLIC PANEL
Anion gap: 6 (ref 5–15)
BUN: 11 mg/dL (ref 6–20)
CALCIUM: 7.6 mg/dL — AB (ref 8.9–10.3)
CO2: 28 mmol/L (ref 22–32)
CREATININE: 0.65 mg/dL (ref 0.44–1.00)
Chloride: 102 mmol/L (ref 101–111)
Glucose, Bld: 116 mg/dL — ABNORMAL HIGH (ref 65–99)
Potassium: 3.2 mmol/L — ABNORMAL LOW (ref 3.5–5.1)
SODIUM: 136 mmol/L (ref 135–145)

## 2017-01-10 LAB — PREPARE RBC (CROSSMATCH)

## 2017-01-10 MED ORDER — DIPHENHYDRAMINE HCL 50 MG/ML IJ SOLN
25.0000 mg | Freq: Once | INTRAMUSCULAR | Status: AC
  Administered 2017-01-10: 25 mg via INTRAVENOUS
  Filled 2017-01-10: qty 1

## 2017-01-10 MED ORDER — HYDROMORPHONE HCL 1 MG/ML IJ SOLN
2.0000 mg | Freq: Once | INTRAMUSCULAR | Status: AC
  Administered 2017-01-10: 2 mg via INTRAVENOUS
  Filled 2017-01-10: qty 2

## 2017-01-10 MED ORDER — SODIUM CHLORIDE 0.9 % IV SOLN
Freq: Once | INTRAVENOUS | Status: AC
  Administered 2017-01-10: 19:00:00 via INTRAVENOUS

## 2017-01-10 MED ORDER — POTASSIUM CHLORIDE CRYS ER 20 MEQ PO TBCR
40.0000 meq | EXTENDED_RELEASE_TABLET | ORAL | Status: AC
  Administered 2017-01-10 (×2): 40 meq via ORAL
  Filled 2017-01-10 (×2): qty 2

## 2017-01-10 MED ORDER — ACETAMINOPHEN 325 MG PO TABS
650.0000 mg | ORAL_TABLET | Freq: Once | ORAL | Status: AC
  Administered 2017-01-10: 650 mg via ORAL
  Filled 2017-01-10: qty 2

## 2017-01-10 MED ORDER — SODIUM CHLORIDE 0.9 % IV SOLN: INTRAVENOUS | Status: DC

## 2017-01-10 NOTE — Progress Notes (Signed)
CRITICAL VALUE ALERT  Critical value received:  Hemoglobin 6.7  Date of notification:  [id number]  Time of notification:  06.59  Critical value read back: yes  Nurse who received alert:  [email or web address]  MD notified (1st page): Memon   Time of first page:  0701  MD notified (2nd page):  Time of second page:  Responding MD:    Time MD responded:

## 2017-01-10 NOTE — Progress Notes (Signed)
PROGRESS NOTE    Dawn [name]  XNT:[id number] DOB: May 20, 1981 DOA: 01/02/2017 PCP: [name], FNP    Brief Narrative:  36 year old female with history of sickle cell anemia, presented with sickle cell crisis. She's had 13 admissions in the past 6 months and 27 ED visits in the last 6 months. She was admitted for sickle cell anemia pain crisis. She is currently on a Dilaudid PCA. She's been transfused 2 units of PRBCs for worsening anemia. Hospital course complicated by development of fever and evidence of staph aureus bacteremia. Port-A-Cath that was placed earlier this month will need to be removed. She isn't undergoing further workup including echocardiogram and possible TEE.   Assessment & Plan:   Principal Problem:   Sickle cell crisis (HCC) Active Problems:   Sickle cell disease (HCC)   Elevated LFTs   Hyperbilirubinemia   Thrombocytopenia (HCC)   Chronic pain syndrome   Anemia of chronic disease   Bacteremia due to Staphylococcus aureus  1. Sickle cell pain crisis. Continues to have uncontrolled pain, requiring PCA. She reports mild improvement of her pain after addition of Toradol.. She is continued on her long-acting narcotics.  2. Sickle cell anemia. Hemoglobin 6.7 today. She did receive 2 units of PRBCs and 4/5. Hemoglobin has continued to trend down slowly. We'll hold off on transfusion for now. Continue to monitor.  3. Staph aureus bacteremia. Hospital course has been complicated by development of fever. Chest x-ray did not show any significant findings. Blood cultures have returned positive for MSSA bacteremia. Discussed with infectious disease who recommended removal of Port-A-Cath. Repeat blood cultures will be drawn today. Since this is a recurrent bacteremia, she will need a minimum of 4 weeks of antibiotics. She will undergo 2-D echocardiogram today and if negative, plans are for transesophageal echocardiogram to be done tomorrow. If positive, she will need a total  of 6 weeks of antibiotics. Once antibiotics are complete, she will need repeat surveillance cultures to ensure that bacteremia has cleared. General surgery has been consulted for Port-A-Cath removal. She does not appear to have any other source of bacteremia. She does have lower back pain. We will check MRI of lumbar spine to rule out any source of infection including discitis.  4. Thrombocytopenia. Possibly related to crisis. Continue to follow.  5. Chronic hyperbilirubinemia. Likely secondary to hemolysis. Continue to monitor.   DVT prophylaxis: SCDs Code Status: Full code Family Communication: no family present Disposition Plan: Discharge home once improved.    Consultants:   Cardiology for TEE   general surgery for Port-A-Cath removal  Infectious disease (phone consult)  Procedures:   2D echo pending.  Antimicrobials:   Ancef 4/18>>    Subjective: Feels the pain is mildly better today. Continues to have pain in lower back, arms and legs.  Objective: Vitals:   01/10/17 0340 01/10/17 0519 01/10/17 0738 01/10/17 1200  BP:  119/61    Pulse:  87    Resp: (!) 25 20 17 15   Temp:  98.6 F (37 C)    TempSrc:  Oral    SpO2: 100% 100% 97%   Weight:      Height:        Intake/Output Summary (Last 24 hours) at 01/10/17 1529 Last data filed at 01/10/17 1253  Gross per 24 hour  Intake          2191.47 ml  Output                0 ml  Net  2191.47 ml   Filed Weights   01/02/17 1533 01/02/17 2110  Weight: 76.2 kg (168 lb) 76.2 kg (168 lb)    Examination:  General exam: Alert, awake, oriented x 3 Respiratory system: Clear to auscultation. Respiratory effort normal. Port-A-Cath and right chest Cardiovascular system:RRR. No murmurs, rubs, gallops. Trace pedal edema bilaterally Gastrointestinal system: Abdomen is nondistended, soft and nontender. No organomegaly or masses felt. Normal bowel sounds heard. Central nervous system: Alert and oriented. No focal  neurological deficits. Extremities: No C/C, +pedal pulses Skin: No rashes, lesions or ulcers Psychiatry: Judgement and insight appear normal. Mood & affect appropriate.      Data Reviewed: I have personally reviewed following labs and imaging studies  CBC:  Recent Labs Lab 01/05/17 0604 01/06/17 0539 01/07/17 0630 01/08/17 0406 01/10/17 0528  WBC 12.3* 11.7* 9.3 9.2 15.0*  NEUTROABS  --   --  5.4  --   --   HGB 5.0* 4.5* 7.7* 7.0* 6.7*  HCT 13.8* 12.9* 20.0* 20.2* 19.2*  MCV 96.5 99.2 93.5 94.4 97.0  PLT 93* 82* 96* 93* 86*   Basic Metabolic Panel:  Recent Labs Lab 01/07/17 0630 01/10/17 0528  NA 138 136  K 3.5 3.2*  CL 108 102  CO2 25 28  GLUCOSE 125* 116*  BUN 11 11  CREATININE 0.45 0.65  CALCIUM 8.0* 7.6*   GFR: Estimated Creatinine Clearance: 95 mL/min (by C-G formula based on SCr of 0.65 mg/dL). Liver Function Tests:  Recent Labs Lab 01/07/17 0630  AST 110*  ALT 25  ALKPHOS 112  BILITOT 13.7*  PROT 6.0*  ALBUMIN 2.0*   No results for input(s): LIPASE, AMYLASE in the last 168 hours. No results for input(s): AMMONIA in the last 168 hours. Coagulation Profile: No results for input(s): INR, PROTIME in the last 168 hours. Cardiac Enzymes: No results for input(s): CKTOTAL, CKMB, CKMBINDEX, TROPONINI in the last 168 hours. BNP (last 3 results) No results for input(s): PROBNP in the last 8760 hours. HbA1C: No results for input(s): HGBA1C in the last 72 hours. CBG: No results for input(s): GLUCAP in the last 168 hours. Lipid Profile: No results for input(s): CHOL, HDL, LDLCALC, TRIG, CHOLHDL, LDLDIRECT in the last 72 hours. Thyroid Function Tests: No results for input(s): TSH, T4TOTAL, FREET4, T3FREE, THYROIDAB in the last 72 hours. Anemia Panel: No results for input(s): VITAMINB12, FOLATE, FERRITIN, TIBC, IRON, RETICCTPCT in the last 72 hours. Sepsis Labs: No results for input(s): PROCALCITON, LATICACIDVEN in the last 168 hours.  Recent Results  (from the past 240 hour(s))  Culture, blood (Routine X 2) w Reflex to ID Panel     Status: Abnormal (Preliminary result)   Collection Time: 01/08/17  3:14 PM  Result Value Ref Range Status   Specimen Description PORTA CATH  Final   Special Requests   Final    BOTTLES DRAWN AEROBIC ONLY Blood Culture adequate volume   Culture  Setup Time   Final    GRAM POSITIVE COCCI IN CLUSTERS Gram Stain Report Called to,Read Back By and Verified With: TETREAULT,H AT 0700 BY HUFFINES,S ON 01/09/17.    Culture (A)  Final    STAPHYLOCOCCUS AUREUS SUSCEPTIBILITIES TO FOLLOW Performed at [facility], [place] [address] [place], [place] [address]    Report Status PENDING  Incomplete  Blood Culture ID Panel (Reflexed)     Status: Abnormal   Collection Time: 01/08/17  3:14 PM  Result Value Ref Range Status   Enterococcus species NOT DETECTED NOT DETECTED Final  Listeria monocytogenes NOT DETECTED NOT DETECTED Final   Staphylococcus species DETECTED (A) NOT DETECTED Final    Comment: CRITICAL RESULT CALLED TO, READ BACK BY AND VERIFIED WITH: L. Seay Pharm.D. 10:45 01/09/17 ([email or web address])    Staphylococcus aureus DETECTED (A) NOT DETECTED Final    Comment: Methicillin (oxacillin) susceptible Staphylococcus aureus (MSSA). Preferred therapy is anti staphylococcal beta lactam antibiotic (Cefazolin or Nafcillin), unless clinically contraindicated. CRITICAL RESULT CALLED TO, READ BACK BY AND VERIFIED WITH: L. Seay Pharm.D. 10:45 01/09/17 ([email or web address])    Methicillin resistance NOT DETECTED NOT DETECTED Final   Streptococcus species NOT DETECTED NOT DETECTED Final   Streptococcus agalactiae NOT DETECTED NOT DETECTED Final   Streptococcus pneumoniae NOT DETECTED NOT DETECTED Final   Streptococcus pyogenes NOT DETECTED NOT DETECTED Final   Acinetobacter baumannii NOT DETECTED NOT DETECTED Final   Enterobacteriaceae species NOT DETECTED NOT DETECTED Final   Enterobacter cloacae complex NOT DETECTED NOT DETECTED Final    Escherichia coli NOT DETECTED NOT DETECTED Final   Klebsiella oxytoca NOT DETECTED NOT DETECTED Final   Klebsiella pneumoniae NOT DETECTED NOT DETECTED Final   Proteus species NOT DETECTED NOT DETECTED Final   Serratia marcescens NOT DETECTED NOT DETECTED Final   Haemophilus influenzae NOT DETECTED NOT DETECTED Final   Neisseria meningitidis NOT DETECTED NOT DETECTED Final   Pseudomonas aeruginosa NOT DETECTED NOT DETECTED Final   Candida albicans NOT DETECTED NOT DETECTED Final   Candida glabrata NOT DETECTED NOT DETECTED Final   Candida krusei NOT DETECTED NOT DETECTED Final   Candida parapsilosis NOT DETECTED NOT DETECTED Final   Candida tropicalis NOT DETECTED NOT DETECTED Final    Comment: Performed at [facility], [address], [place], [place] [address]  Culture, blood (Routine X 2) w Reflex to ID Panel     Status: Abnormal (Preliminary result)   Collection Time: 01/08/17  3:29 PM  Result Value Ref Range Status   Specimen Description BLOOD RIGHT HAND  Final   Special Requests   Final    BOTTLES DRAWN AEROBIC ONLY Blood Culture results may not be optimal due to an inadequate volume of blood received in culture bottles   Culture  Setup Time   Final    GRAM POSITIVE COCCI IN CLUSTERS Gram Stain Report Called to,Read Back By and Verified With: [facility] @ 1427 ON 4.18.18 BY BAYSE,L AEROBIC BOTTLE ONLY CRITICAL VALUE NOTED.  VALUE IS CONSISTENT WITH PREVIOUSLY REPORTED AND CALLED VALUE. Performed at [facility], [place] [address] [place], [place] [address]    Culture STAPHYLOCOCCUS AUREUS (A)  Final   Report Status PENDING  Incomplete         Radiology Studies: No results found.      Scheduled Meds: . aspirin EC  81 mg Oral Daily  . docusate sodium  100 mg Oral BID  . folic acid  1 mg Oral Daily  . gabapentin  100 mg Oral TID  . HYDROmorphone   Intravenous Q4H  . ketorolac  30 mg Intravenous Q6H  . L-glutamine  5 g Oral BID  . loratadine  10 mg  Oral Daily  . metroNIDAZOLE  500 mg Oral BID  . morphine  15 mg Oral Q12H  . potassium chloride  10 mEq Oral Daily   Continuous Infusions: . sodium chloride 250 mL (01/09/17 1543)  . sodium chloride 10 mL/hr at 01/09/17 1543  . ceFAZolin (ANCEF) IVPB (doses >1 g) 2 g (01/10/17 1230)     LOS: 8 days  Time spent: 77mins    MEMON,JEHANZEB, MD [facility] Pager [phone]  If 7PM-7AM, please contact night-coverage [email or web address] Password [facility] 01/10/2017, 3:29 PM

## 2017-01-10 NOTE — Progress Notes (Signed)
*  PRELIMINARY RESULTS* Echocardiogram 2D Echocardiogram has been performed.  Dawn [name] 01/10/2017, 2:23 PM

## 2017-01-10 NOTE — Care Management Note (Signed)
Case Management Note  Patient Details  Name: Dawn [name] MRN: [id number] Date of Birth: 1981/05/09   If discussed at Long Length of Stay Meetings, dates discussed:  01/10/2017  Additional Comments:  Hava Massingale, Chauncey Reading, RN 01/10/2017, 10:23 AM

## 2017-01-11 ENCOUNTER — Inpatient Hospital Stay (HOSPITAL_COMMUNITY): Payer: Medicare Other | Admitting: Anesthesiology

## 2017-01-11 ENCOUNTER — Encounter (HOSPITAL_COMMUNITY): Payer: Self-pay | Admitting: *Deleted

## 2017-01-11 ENCOUNTER — Inpatient Hospital Stay (HOSPITAL_COMMUNITY): Payer: Medicare Other

## 2017-01-11 ENCOUNTER — Encounter (HOSPITAL_COMMUNITY)
Admission: EM | Disposition: A | Discharge status: Home Health | Payer: Self-pay | Source: Home / Self Care | Attending: Internal Medicine

## 2017-01-11 DIAGNOSIS — R7881 Bacteremia: Secondary | ICD-10-CM

## 2017-01-11 HISTORY — PX: TEE WITHOUT CARDIOVERSION: SHX5443

## 2017-01-11 HISTORY — PX: CENTRAL VENOUS CATHETER INSERTION: SHX401

## 2017-01-11 HISTORY — PX: PORT-A-CATH REMOVAL: SHX5289

## 2017-01-11 LAB — SURGICAL PCR SCREEN
MRSA, PCR: NEGATIVE
STAPHYLOCOCCUS AUREUS: POSITIVE — AB

## 2017-01-11 LAB — CBC
HCT: 22.2 % — ABNORMAL LOW (ref 36.0–46.0)
HEMOGLOBIN: 7.9 g/dL — AB (ref 12.0–15.0)
MCH: 33.3 pg (ref 26.0–34.0)
MCHC: 35.6 g/dL (ref 30.0–36.0)
MCV: 93.7 fL (ref 78.0–100.0)
Platelets: 88 10*3/uL — ABNORMAL LOW (ref 150–400)
RBC: 2.37 MIL/uL — AB (ref 3.87–5.11)
RDW: 20.1 % — ABNORMAL HIGH (ref 11.5–15.5)
WBC: 14.2 10*3/uL — ABNORMAL HIGH (ref 4.0–10.5)

## 2017-01-11 LAB — CULTURE, BLOOD (ROUTINE X 2): Special Requests: ADEQUATE

## 2017-01-11 LAB — ECHO TEE
Reg peak vel: 248 cm/s
TR max vel: 248 cm/s

## 2017-01-11 SURGERY — ECHOCARDIOGRAM, TRANSESOPHAGEAL
Anesthesia: Monitor Anesthesia Care

## 2017-01-11 SURGERY — REMOVAL PORT-A-CATH
Anesthesia: Monitor Anesthesia Care

## 2017-01-11 MED ORDER — LIDOCAINE HCL (PF) 1 % IJ SOLN
INTRAMUSCULAR | Status: DC | PRN
  Administered 2017-01-11 (×2): 5 mL via SUBCUTANEOUS

## 2017-01-11 MED ORDER — LACTATED RINGERS IV SOLN
INTRAVENOUS | Status: DC
  Administered 2017-01-11 (×2): via INTRAVENOUS

## 2017-01-11 MED ORDER — LIDOCAINE VISCOUS 2 % MT SOLN
OROMUCOSAL | Status: AC
  Filled 2017-01-11: qty 15

## 2017-01-11 MED ORDER — MIDAZOLAM HCL 2 MG/2ML IJ SOLN
INTRAMUSCULAR | Status: AC
  Filled 2017-01-11: qty 2

## 2017-01-11 MED ORDER — LIDOCAINE HCL (PF) 1 % IJ SOLN
INTRAMUSCULAR | Status: AC
Start: 2017-01-11 — End: 2017-01-11
  Filled 2017-01-11: qty 30

## 2017-01-11 MED ORDER — PROPOFOL 10 MG/ML IV BOLUS
INTRAVENOUS | Status: AC
  Filled 2017-01-11: qty 20

## 2017-01-11 MED ORDER — LIDOCAINE HCL (PF) 1 % IJ SOLN
INTRAMUSCULAR | Status: AC
Start: 2017-01-11 — End: 2017-01-11
  Filled 2017-01-11: qty 2

## 2017-01-11 MED ORDER — MIDAZOLAM HCL 5 MG/5ML IJ SOLN
INTRAMUSCULAR | Status: AC
  Filled 2017-01-11: qty 5

## 2017-01-11 MED ORDER — SODIUM CHLORIDE 0.9% FLUSH
INTRAVENOUS | Status: AC
  Filled 2017-01-11: qty 30

## 2017-01-11 MED ORDER — CHLORHEXIDINE GLUCONATE CLOTH 2 % EX PADS
6.0000 | MEDICATED_PAD | Freq: Once | CUTANEOUS | Status: AC
  Administered 2017-01-11: 6 via TOPICAL

## 2017-01-11 MED ORDER — MIDAZOLAM HCL 5 MG/5ML IJ SOLN
INTRAMUSCULAR | Status: DC | PRN
  Administered 2017-01-11: 2 mg via INTRAVENOUS
  Administered 2017-01-11: 1 mg via INTRAVENOUS

## 2017-01-11 MED ORDER — SODIUM CHLORIDE 0.9 % IV SOLN
INTRAVENOUS | Status: DC
  Administered 2017-01-11: 16:00:00 via INTRAVENOUS

## 2017-01-11 MED ORDER — CHLORHEXIDINE GLUCONATE CLOTH 2 % EX PADS
6.0000 | MEDICATED_PAD | Freq: Every day | CUTANEOUS | Status: DC
  Administered 2017-01-11: 6 via TOPICAL

## 2017-01-11 MED ORDER — PROPOFOL 500 MG/50ML IV EMUL
INTRAVENOUS | Status: DC | PRN
  Administered 2017-01-11: 14:00:00 via INTRAVENOUS
  Administered 2017-01-11: 100 ug/kg/min via INTRAVENOUS

## 2017-01-11 MED ORDER — FENTANYL CITRATE (PF) 100 MCG/2ML IJ SOLN
25.0000 ug | INTRAMUSCULAR | Status: AC
  Administered 2017-01-11 (×2): 25 ug via INTRAVENOUS
  Filled 2017-01-11: qty 2

## 2017-01-11 MED ORDER — MUPIROCIN 2 % EX OINT
1.0000 "application " | TOPICAL_OINTMENT | Freq: Two times a day (BID) | CUTANEOUS | Status: DC
  Administered 2017-01-11: 1 via NASAL
  Filled 2017-01-11: qty 22

## 2017-01-11 MED ORDER — MIDAZOLAM HCL 2 MG/2ML IJ SOLN
1.0000 mg | INTRAMUSCULAR | Status: AC
  Administered 2017-01-11: 2 mg via INTRAVENOUS

## 2017-01-11 SURGICAL SUPPLY — 16 items
BAG HAMPER (MISCELLANEOUS) ×4 IMPLANT
CHLORAPREP W/TINT 10.5 ML (MISCELLANEOUS) ×4 IMPLANT
CLOTH BEACON ORANGE TIMEOUT ST (SAFETY) ×4 IMPLANT
DECANTER SPIKE VIAL GLASS SM (MISCELLANEOUS) ×4 IMPLANT
DERMABOND ADVANCED (GAUZE/BANDAGES/DRESSINGS) ×2
DERMABOND ADVANCED .7 DNX12 (GAUZE/BANDAGES/DRESSINGS) ×2 IMPLANT
DRAPE PROXIMA HALF (DRAPES) ×4 IMPLANT
ELECT REM PT RETURN 9FT ADLT (ELECTROSURGICAL) ×4
ELECTRODE REM PT RTRN 9FT ADLT (ELECTROSURGICAL) ×2 IMPLANT
GLOVE BIOGEL PI IND STRL 7.0 (GLOVE) ×2 IMPLANT
GLOVE BIOGEL PI INDICATOR 7.0 (GLOVE) ×2
GLOVE SURG SS PI 7.5 STRL IVOR (GLOVE) ×4 IMPLANT
GOWN STRL REUS W/TWL LRG LVL3 (GOWN DISPOSABLE) ×4 IMPLANT
SUT VIC AB 3-0 SH 27 (SUTURE) ×2
SUT VIC AB 3-0 SH 27X BRD (SUTURE) ×2 IMPLANT
SUT VIC AB 4-0 PS2 27 (SUTURE) ×4 IMPLANT

## 2017-01-11 NOTE — Anesthesia Preprocedure Evaluation (Signed)
Anesthesia Evaluation  Patient identified by MRN, date of birth, ID band Patient awake    Reviewed: Allergy & Precautions, H&P , NPO status , Patient's Chart, lab work & pertinent test results  Airway Mallampati: II  TM Distance: >3 FB     Dental  (+) Teeth Intact   Pulmonary neg pulmonary ROS, Pneumonia:  ,    breath sounds clear to auscultation       Cardiovascular (-) hypertension (PIH) Rhythm:Regular Rate:Normal     Neuro/Psych PSYCHIATRIC DISORDERS Depression    GI/Hepatic neg GERD  ,  Endo/Other    Renal/GU      Musculoskeletal   Abdominal   Peds  Hematology  (+) Blood dyscrasia, Sickle cell anemia and anemia ,   Anesthesia Other Findings Transfused yesterday. Bacteremia, must have central line removed. .  Reproductive/Obstetrics                             Anesthesia Physical Anesthesia Plan  ASA: IV  Anesthesia Plan: MAC   Post-op Pain Management:    Induction: Intravenous  Airway Management Planned: Simple Face Mask  Additional Equipment:   Intra-op Plan:   Post-operative Plan:   Informed Consent: I have reviewed the patients History and Physical, chart, labs and discussed the procedure including the risks, benefits and alternatives for the proposed anesthesia with the patient or authorized representative who has indicated his/her understanding and acceptance.     Plan Discussed with:   Anesthesia Plan Comments:         Anesthesia Quick Evaluation

## 2017-01-11 NOTE — Interval H&P Note (Signed)
History and Physical Interval Note:  01/11/2017 12:37 PM  Dawn [name]  has presented today for surgery, with the diagnosis of bacteremia,infected port, sickle cell disease  The various methods of treatment have been discussed with the patient and family. After consideration of risks, benefits and other options for treatment, the patient has consented to  Procedure(s): TRANSESOPHAGEAL ECHOCARDIOGRAM (TEE) WITH PROPOFOL (N/A) REMOVAL PORT-A-CATH (N/A) as a surgical intervention .  The patient's history has been reviewed, patient examined, no change in status, stable for surgery.  I have reviewed the patient's chart and labs.  Questions were answered to the patient's satisfaction.     Aviva Signs

## 2017-01-11 NOTE — H&P (View-Only) (Signed)
Patient seen, chart reviewed. Have been requested to remove Port-A-Cath due to infection. We will perform this later today. The risks and benefits of the procedure were fully explained to the patient, who gave informed consent.

## 2017-01-11 NOTE — Progress Notes (Signed)
*  PRELIMINARY RESULTS* Echocardiogram Echocardiogram Transesophageal has been performed.  Leavy Cella 01/11/2017, 2:46 PM

## 2017-01-11 NOTE — Op Note (Signed)
Patient:  Dawn [name]  DOB:  November 10, 1980  MRN:  [id number]   Preop Diagnosis:  Bacteremia, infected port, sickle cell disease  Postop Diagnosis:  Same  Procedure:  Removal Port-A-Cath, placement of central line  Surgeon:  Aviva Signs, M.D.  Anes:  Mac  Indications:  Patient is a 36 year old black female who has had multiple Port-A-Cath placed in the past for difficult access due to her sickle cell disease who now presents with bacteremia and infected port. The patient now comes to the minor procedure room for Port-A-Cath removal and placement of a right femoral central line as peripheral access cannot be obtained. The risks and benefits of both procedures were fully explained to the patient, who gave informed consent.  Procedure note:  The patient was placed in the supine position. The right femoral region was prepped and draped using the usual sterile technique with ChloraPrep. Surgical site confirmation was performed. One percent Xylocaine was used for local anesthesia. The needles advanced into the right femoral vein using the Seldinger technique without difficulty. A guidewire was then advanced. A small incision was made in an introducer worse placed over the guidewire. The triple-lumen catheter was then placed over the guidewire. The guidewire was removed. Good aspiration of blood was noted on all 3 ports. All 3 ports were flushed with saline. The catheter was secured in place using a 2-0 silk suture. A dry sterile dressing was then applied.  Next, the right sided Port-A-Cath was removed. One percent Xylocaine was used for local anesthesia. An incision was made through the previous incision site. This was taken down to the port. The port was removed in total without difficulty. The catheter tip was sent for aerobic culture. The cutaneous layer was reapproximated using a 3-0 Vicryl interrupted suture. The skin was closed using a 4-0 Vicryl subcuticular suture. Dermabond was then applied.  All  tape and needle counts were correct at the end the procedures. The patient was then undergoing TEE by cardiology.  Complications:  None  EBL:  Minimal  Specimen:  Catheter tip culture

## 2017-01-11 NOTE — Progress Notes (Signed)
Attempted x 6 to obtain venous access using ultrasound without success.  Order received to re access port and medicate.

## 2017-01-11 NOTE — Progress Notes (Signed)
PROGRESS NOTE    Dawn [name]  QTM:[id number] DOB: [birth year] DOA: 01/02/2017 PCP: [name], FNP    Brief Narrative:  36 year old female with history of sickle cell anemia, presented with sickle cell crisis. She's had 13 admissions in the past 6 months and 27 ED visits in the last 6 months. She was admitted for sickle cell anemia pain crisis. She is currently on a Dilaudid PCA. She's been transfused 2 units of PRBCs for worsening anemia. Hospital course complicated by development of fever and evidence of staph aureus bacteremia. Port-A-Cath that was placed earlier this month will need to be removed. She isn't undergoing further workup including echocardiogram and possible TEE.   Assessment & Plan:   Principal Problem:   Sickle cell crisis (HCC) Active Problems:   Sickle cell disease (HCC)   Elevated LFTs   Hyperbilirubinemia   Thrombocytopenia (HCC)   Chronic pain syndrome   Anemia of chronic disease   Bacteremia due to Staphylococcus aureus  1. Sickle cell pain crisis. Continues to have uncontrolled pain, requiring PCA. Continue current treatments and toradol. She is continued on her long-acting narcotics.  2. Sickle cell anemia. Hemoglobin 6.7 today. She did receive 2 units of PRBCs and 4/5 with improvement of hemoglobin. She received another 2 units on 4/19 in preparation for surgery. Continue to monitor.  3. Staph aureus bacteremia. Hospital course has been complicated by development of fever. Chest x-ray did not show any significant findings. Blood cultures returned positive for MSSA bacteremia. Discussed with infectious disease who recommended removal of Port-A-Cath which was completed on 4/20. Repeat blood cultures will be drawn 4/19 have shows no growth thus far. TEE did not show any evidence of vegetations and MRI L spine did not show infection/discitis. Since this is a recurrent bacteremia, she will need 4 weeks of IV antibiotics. Once antibiotics are complete, she will  need repeat surveillance cultures to ensure that bacteremia has cleared.  4. Thrombocytopenia. Possibly related to crisis. Continue to follow.  5. Chronic hyperbilirubinemia. Likely secondary to hemolysis. Continue to monitor.   DVT prophylaxis: SCDs Code Status: Full code Family Communication: no family present Disposition Plan: Discharge home once improved.    Consultants:   Cardiology for TEE   general surgery for Port-A-Cath removal  Infectious disease (phone consult)  Procedures:  2D echo: - Left ventricle: The cavity size was normal. Wall thickness was   normal. Systolic function was normal. The estimated ejection   fraction was in the range of 60% to 65%. Wall motion was normal;   there were no regional wall motion abnormalities. The study is   not technically sufficient to allow evaluation of LV diastolic   function. - Aortic valve: Valve area (VTI): 2.19 cm^2. Valve area (Vmax):   2.23 cm^2. Valve area (Vmean): 2.14 cm^2. - Left atrium: The atrium was moderately dilated. - Right atrium: The atrium was mildly dilated. - Pulmonary arteries: Systolic pressure was moderately increased.   PA peak pressure: 37 mm Hg (S). - Pericardium, extracardiac: There is a small circumferential   pericardial effusion.  - Technically adequate study.   TEE: No evidence of endocarditis. There was no evidence of a    vegetation.    Removal of portacath by Dr. Arnoldo Morale on 4/20  Placement of femoral central line by Dr. Arnoldo Morale on 4/20 .  Antimicrobials:   Ancef 4/18>>    Subjective: Complaining of pain in legs and back  Objective: Vitals:   01/11/17 1415 01/11/17 1430 01/11/17 1445 01/11/17 1511  BP:  116/66 115/63   Pulse:    94  Resp: 14 19 16 17   Temp: 98.4 F (36.9 C)   98.7 F (37.1 C)  TempSrc:    Oral  SpO2: 100% 99% 98% 100%  Weight:      Height:        Intake/Output Summary (Last 24 hours) at 01/11/17 1820 Last data filed at 01/11/17 1419  Gross per  24 hour  Intake             1166 ml  Output                2 ml  Net             1164 ml   Filed Weights   01/02/17 1533 01/02/17 2110  Weight: 76.2 kg (168 lb) 76.2 kg (168 lb)    Examination:  General exam: Alert, awake, oriented x 3 Respiratory system: Clear to auscultation. Respiratory effort normal. Cardiovascular system:RRR. No murmurs, rubs, gallops. Gastrointestinal system: Abdomen is nondistended, soft and nontender. No organomegaly or masses felt. Normal bowel sounds heard. Central nervous system: Alert and oriented. No focal neurological deficits. Extremities: No C/C/E, +pedal pulses Skin: No rashes, lesions or ulcers Psychiatry: Judgement and insight appear normal. Mood & affect appropriate.   Data Reviewed: I have personally reviewed following labs and imaging studies  CBC:  Recent Labs Lab 01/06/17 0539 01/07/17 0630 01/08/17 0406 01/10/17 0528 01/11/17 0619  WBC 11.7* 9.3 9.2 15.0* 14.2*  NEUTROABS  --  5.4  --   --   --   HGB 4.5* 7.7* 7.0* 6.7* 7.9*  HCT 12.9* 20.0* 20.2* 19.2* 22.2*  MCV 99.2 93.5 94.4 97.0 93.7  PLT 82* 96* 93* 86* 88*   Basic Metabolic Panel:  Recent Labs Lab 01/07/17 0630 01/10/17 0528  NA 138 136  K 3.5 3.2*  CL 108 102  CO2 25 28  GLUCOSE 125* 116*  BUN 11 11  CREATININE 0.45 0.65  CALCIUM 8.0* 7.6*   GFR: Estimated Creatinine Clearance: 95 mL/min (by C-G formula based on SCr of 0.65 mg/dL). Liver Function Tests:  Recent Labs Lab 01/07/17 0630  AST 110*  ALT 25  ALKPHOS 112  BILITOT 13.7*  PROT 6.0*  ALBUMIN 2.0*   No results for input(s): LIPASE, AMYLASE in the last 168 hours. No results for input(s): AMMONIA in the last 168 hours. Coagulation Profile: No results for input(s): INR, PROTIME in the last 168 hours. Cardiac Enzymes: No results for input(s): CKTOTAL, CKMB, CKMBINDEX, TROPONINI in the last 168 hours. BNP (last 3 results) No results for input(s): PROBNP in the last 8760 hours. HbA1C: No  results for input(s): HGBA1C in the last 72 hours. CBG: No results for input(s): GLUCAP in the last 168 hours. Lipid Profile: No results for input(s): CHOL, HDL, LDLCALC, TRIG, CHOLHDL, LDLDIRECT in the last 72 hours. Thyroid Function Tests: No results for input(s): TSH, T4TOTAL, FREET4, T3FREE, THYROIDAB in the last 72 hours. Anemia Panel: No results for input(s): VITAMINB12, FOLATE, FERRITIN, TIBC, IRON, RETICCTPCT in the last 72 hours. Sepsis Labs: No results for input(s): PROCALCITON, LATICACIDVEN in the last 168 hours.  Recent Results (from the past 240 hour(s))  Culture, blood (Routine X 2) w Reflex to ID Panel     Status: Abnormal   Collection Time: 01/08/17  3:14 PM  Result Value Ref Range Status   Specimen Description PORTA CATH  Final   Special Requests   Final    BOTTLES DRAWN  AEROBIC ONLY Blood Culture adequate volume   Culture  Setup Time   Final    GRAM POSITIVE COCCI IN CLUSTERS Gram Stain Report Called to,Read Back By and Verified With: TETREAULT,H AT 0700 BY HUFFINES,S ON 01/09/17.    Culture STAPHYLOCOCCUS AUREUS (A)  Final   Report Status 01/11/2017 FINAL  Final   Organism ID, Bacteria STAPHYLOCOCCUS AUREUS  Final      Susceptibility   Staphylococcus aureus - MIC*    CIPROFLOXACIN <=0.5 SENSITIVE Sensitive     ERYTHROMYCIN <=0.25 SENSITIVE Sensitive     GENTAMICIN <=0.5 SENSITIVE Sensitive     OXACILLIN <=0.25 SENSITIVE Sensitive     TETRACYCLINE <=1 SENSITIVE Sensitive     VANCOMYCIN <=0.5 SENSITIVE Sensitive     TRIMETH/SULFA <=10 SENSITIVE Sensitive     CLINDAMYCIN <=0.25 SENSITIVE Sensitive     RIFAMPIN <=0.5 SENSITIVE Sensitive     Inducible Clindamycin NEGATIVE Sensitive     * STAPHYLOCOCCUS AUREUS  Blood Culture ID Panel (Reflexed)     Status: Abnormal   Collection Time: 01/08/17  3:14 PM  Result Value Ref Range Status   Enterococcus species NOT DETECTED NOT DETECTED Final   Listeria monocytogenes NOT DETECTED NOT DETECTED Final   Staphylococcus  species DETECTED (A) NOT DETECTED Final    Comment: CRITICAL RESULT CALLED TO, READ BACK BY AND VERIFIED WITH: L. Seay Pharm.D. 10:45 01/09/17 ([email or web address])    Staphylococcus aureus DETECTED (A) NOT DETECTED Final    Comment: Methicillin (oxacillin) susceptible Staphylococcus aureus (MSSA). Preferred therapy is anti staphylococcal beta lactam antibiotic (Cefazolin or Nafcillin), unless clinically contraindicated. CRITICAL RESULT CALLED TO, READ BACK BY AND VERIFIED WITH: L. Seay Pharm.D. 10:45 01/09/17 ([email or web address])    Methicillin resistance NOT DETECTED NOT DETECTED Final   Streptococcus species NOT DETECTED NOT DETECTED Final   Streptococcus agalactiae NOT DETECTED NOT DETECTED Final   Streptococcus pneumoniae NOT DETECTED NOT DETECTED Final   Streptococcus pyogenes NOT DETECTED NOT DETECTED Final   Acinetobacter baumannii NOT DETECTED NOT DETECTED Final   Enterobacteriaceae species NOT DETECTED NOT DETECTED Final   Enterobacter cloacae complex NOT DETECTED NOT DETECTED Final   Escherichia coli NOT DETECTED NOT DETECTED Final   Klebsiella oxytoca NOT DETECTED NOT DETECTED Final   Klebsiella pneumoniae NOT DETECTED NOT DETECTED Final   Proteus species NOT DETECTED NOT DETECTED Final   Serratia marcescens NOT DETECTED NOT DETECTED Final   Haemophilus influenzae NOT DETECTED NOT DETECTED Final   Neisseria meningitidis NOT DETECTED NOT DETECTED Final   Pseudomonas aeruginosa NOT DETECTED NOT DETECTED Final   Candida albicans NOT DETECTED NOT DETECTED Final   Candida glabrata NOT DETECTED NOT DETECTED Final   Candida krusei NOT DETECTED NOT DETECTED Final   Candida parapsilosis NOT DETECTED NOT DETECTED Final   Candida tropicalis NOT DETECTED NOT DETECTED Final    Comment: Performed at [facility], [address] [place], [place] [address]  Culture, blood (Routine X 2) w Reflex to ID Panel     Status: Abnormal   Collection Time: 01/08/17  3:29 PM  Result Value Ref Range Status   Specimen  Description BLOOD RIGHT HAND  Final   Special Requests   Final    BOTTLES DRAWN AEROBIC ONLY Blood Culture results may not be optimal due to an inadequate volume of blood received in culture bottles   Culture  Setup Time   Final    GRAM POSITIVE COCCI IN CLUSTERS Gram Stain Report Called to,Read Back By and Verified With: [facility] @ 1427 ON  4.18.18 BY BAYSE,L AEROBIC BOTTLE ONLY CRITICAL VALUE NOTED.  VALUE IS CONSISTENT WITH PREVIOUSLY REPORTED AND CALLED VALUE.    Culture (A)  Final    STAPHYLOCOCCUS AUREUS SUSCEPTIBILITIES PERFORMED ON PREVIOUS CULTURE WITHIN THE LAST 5 DAYS. Performed at [facility], [place] [address] [place], [place] [address]    Report Status 01/11/2017 FINAL  Final  Culture, blood (routine x 2)     Status: None (Preliminary result)   Collection Time: 01/10/17  3:53 PM  Result Value Ref Range Status   Specimen Description BLOOD RIGHT FOREARM  Final   Special Requests   Final    BOTTLES DRAWN AEROBIC AND ANAEROBIC Blood Culture adequate volume   Culture NO GROWTH < 24 HOURS  Final   Report Status PENDING  Incomplete  Culture, blood (routine x 2)     Status: None (Preliminary result)   Collection Time: 01/10/17  3:59 PM  Result Value Ref Range Status   Specimen Description BLOOD LEFT HAND  Final   Special Requests   Final    BOTTLES DRAWN AEROBIC AND ANAEROBIC Blood Culture adequate volume   Culture NO GROWTH < 24 HOURS  Final   Report Status PENDING  Incomplete  Surgical pcr screen     Status: Abnormal   Collection Time: 01/11/17 12:46 AM  Result Value Ref Range Status   MRSA, PCR NEGATIVE NEGATIVE Final   Staphylococcus aureus POSITIVE (A) NEGATIVE Final    Comment:        The Xpert SA Assay (FDA approved for NASAL specimens in patients over 35 years of age), is one component of a comprehensive surveillance program.  Test performance has been validated by [facility] for patients greater than or equal to 44 year old. It is not intended to  diagnose infection nor to guide or monitor treatment. RESULT CALLED TO, READ BACK BY AND VERIFIED WITH: [facility] AT 0557 BY HUFFINES,S ON 01/11/17.          Radiology Studies: Mr Lumbar Spine Wo Contrast  Result Date: 01/10/2017 CLINICAL DATA:  Persistent low back pain. Fever and white count. Assess for spinal infection. EXAM: MRI LUMBAR SPINE WITHOUT CONTRAST TECHNIQUE: Multiplanar, multisequence MR imaging of the lumbar spine was performed. No intravenous contrast was administered. COMPARISON:  None. FINDINGS: Segmentation:  Normal Alignment:  Normal Vertebrae:  Normal Conus medullaris: Extends to the L1 level and appears normal. Paraspinal and other soft tissues: Normal Disc levels: Normal. No degenerative disc disease. No facet arthropathy. No bone, joint or soft tissue edema. IMPRESSION: Normal exam. No abnormality seen to explain pain. No sign of regional infection. Electronically Signed   By: Nelson Chimes M.D.   On: 01/10/2017 17:19        Scheduled Meds: . Chlorhexidine Gluconate Cloth  6 each Topical Q0600  . docusate sodium  100 mg Oral BID  . folic acid  1 mg Oral Daily  . gabapentin  100 mg Oral TID  . HYDROmorphone   Intravenous Q4H  . ketorolac  30 mg Intravenous Q6H  . L-glutamine  5 g Oral BID  . loratadine  10 mg Oral Daily  . metroNIDAZOLE  500 mg Oral BID  . morphine  15 mg Oral Q12H  . mupirocin ointment  1 application Nasal BID  . potassium chloride  10 mEq Oral Daily   Continuous Infusions: . sodium chloride 250 mL (01/09/17 1543)  . sodium chloride 10 mL/hr at 01/09/17 1543  . sodium chloride    . ceFAZolin (  ANCEF) IVPB (doses >1 g) Stopped (01/11/17 0527)     LOS: 9 days    Time spent: 58mins    Erienne Spelman, MD [facility] Pager [phone]  If 7PM-7AM, please contact night-coverage [email or web address] Password [email or web address] 01/11/2017, 6:20 PM

## 2017-01-11 NOTE — Progress Notes (Signed)
Pt c/o pain that is located just under left breast area, pain isn't new though just more "bothersome".

## 2017-01-11 NOTE — Transfer of Care (Signed)
Immediate Anesthesia Transfer of Care Note  Patient: Dawn [name]  Procedure(s) Performed: Procedure(s): TRANSESOPHAGEAL ECHOCARDIOGRAM (TEE) WITH PROPOFOL (N/A) REMOVAL PORT-A-CATH (N/A) INSERTION CENTRAL LINE ADULT  Patient Location: PACU  Anesthesia Type:MAC  Level of Consciousness: awake, oriented, drowsy and patient cooperative  Airway & Oxygen Therapy: Patient Spontanous Breathing and Patient connected to nasal cannula oxygen  Post-op Assessment: Report given to RN and Post -op Vital signs reviewed and stable  Post vital signs: Reviewed and stable  Last Vitals:  Vitals:   01/11/17 1136 01/11/17 1255  BP:  (!) 126/57  Pulse:    Resp:  14  Temp: 36.7 C     Last Pain:  Vitals:   01/11/17 1136  TempSrc: Oral  PainSc: 7       Patients Stated Pain Goal: 3 (88/91/69 4503)  Complications: No apparent anesthesia complications

## 2017-01-11 NOTE — Progress Notes (Signed)
Late entry Dr called regarding pt's blood not being completed due to loss of IV access.  Order for Picc team placed.

## 2017-01-11 NOTE — Consult Note (Signed)
Patient seen, chart reviewed. Have been requested to remove Port-A-Cath due to infection. We will perform this later today. The risks and benefits of the procedure were fully explained to the patient, who gave informed consent.

## 2017-01-11 NOTE — CV Procedure (Signed)
Procedure: TEE Physician: Dr Carlyle Dolly Indication: recurrent bacteremia   Patient was brought to the procedure suite after appropriate consent was obtained. Due to her long term use and tolerance for opiates the case was done with the assistance of anesthesiology, please refer to there note for details. She was placed in the lateral decubitus position, bite guard was placed. The TEE probe was intubated into the esophagus without difficultly. Severe views were taken. Please see full report for complete findings, overall no evidence of vegetation. She tolerated the procedure well without complicatoins, cardiopulmonary monitoring was performed throughout the case.     Carlyle Dolly MD

## 2017-01-11 NOTE — Progress Notes (Signed)
Pt called and stated that she needed some help now. Pt iv with the blood infusing had came out while she was sleeping. approx 269ml based on flowsheet calculation.Marland Kitchen

## 2017-01-11 NOTE — Anesthesia Postprocedure Evaluation (Signed)
Anesthesia Post Note  Patient: Dawn [name]  Procedure(s) Performed: Procedure(s) (LRB): TRANSESOPHAGEAL ECHOCARDIOGRAM (TEE) WITH PROPOFOL (N/A) REMOVAL PORT-A-CATH (N/A) INSERTION CENTRAL LINE ADULT  Patient location during evaluation: PACU Anesthesia Type: MAC Level of consciousness: awake, patient cooperative and oriented Pain management: pain level controlled Vital Signs Assessment: post-procedure vital signs reviewed and stable Respiratory status: spontaneous breathing, patient connected to nasal cannula oxygen and respiratory function stable Cardiovascular status: stable Postop Assessment: no signs of nausea or vomiting Anesthetic complications: no     Last Vitals:  Vitals:   01/11/17 1255 01/11/17 1415  BP: (!) 126/57   Pulse:    Resp: 14 (P) 14  Temp:  (P) 36.9 C    Last Pain:  Vitals:   01/11/17 1415  TempSrc:   PainSc: (P) Asleep                 Rex Magee A

## 2017-01-12 LAB — BASIC METABOLIC PANEL WITH GFR
Anion gap: 6 (ref 5–15)
BUN: 20 mg/dL (ref 6–20)
CO2: 23 mmol/L (ref 22–32)
Calcium: 7.5 mg/dL — ABNORMAL LOW (ref 8.9–10.3)
Chloride: 105 mmol/L (ref 101–111)
Creatinine, Ser: 0.92 mg/dL (ref 0.44–1.00)
GFR calc Af Amer: >60 mL/min
GFR calc non Af Amer: >60 mL/min
Glucose, Bld: 92 mg/dL (ref 65–99)
Potassium: 3.5 mmol/L (ref 3.5–5.1)
Sodium: 134 mmol/L — ABNORMAL LOW (ref 135–145)

## 2017-01-12 LAB — CBC
HCT: 18.8 % — ABNORMAL LOW (ref 36.0–46.0)
Hemoglobin: 6.7 g/dL — CL (ref 12.0–15.0)
MCH: 33 pg (ref 26.0–34.0)
MCHC: 35.6 g/dL (ref 30.0–36.0)
MCV: 92.6 fL (ref 78.0–100.0)
Platelets: 127 10*3/uL — ABNORMAL LOW (ref 150–400)
RBC: 2.03 MIL/uL — ABNORMAL LOW (ref 3.87–5.11)
RDW: 20.4 % — ABNORMAL HIGH (ref 11.5–15.5)
WBC: 11.2 10*3/uL — ABNORMAL HIGH (ref 4.0–10.5)

## 2017-01-12 MED ORDER — CHLORHEXIDINE GLUCONATE CLOTH 2 % EX PADS
6.0000 | MEDICATED_PAD | Freq: Every day | CUTANEOUS | Status: AC
  Administered 2017-01-12 – 2017-01-16 (×5): 6 via TOPICAL

## 2017-01-12 MED ORDER — MUPIROCIN 2 % EX OINT
1.0000 "application " | TOPICAL_OINTMENT | Freq: Two times a day (BID) | CUTANEOUS | Status: AC
  Administered 2017-01-12 – 2017-01-16 (×10): 1 via NASAL
  Filled 2017-01-12 (×2): qty 22

## 2017-01-12 MED ORDER — FUROSEMIDE 10 MG/ML IJ SOLN
40.0000 mg | Freq: Once | INTRAMUSCULAR | Status: AC
  Administered 2017-01-12: 40 mg via INTRAVENOUS
  Filled 2017-01-12: qty 4

## 2017-01-12 NOTE — Progress Notes (Signed)
Pt c/o not urinating after receiving x1 dose Lasix. Continues to have +2 lower ext edema. Dr. Olevia Bowens paged and made aware. Waiting for orders

## 2017-01-12 NOTE — Addendum Note (Signed)
Addendum  created 01/12/17 2105 by Ollen Bowl, CRNA   Sign clinical note

## 2017-01-12 NOTE — Progress Notes (Signed)
PROGRESS NOTE    Dawn [name]  QPY:[id number] DOB: 1981/09/23 DOA: 01/02/2017 PCP: [name], FNP    Brief Narrative:  36 year old female with history of sickle cell anemia, presented with sickle cell crisis. She's had 13 admissions in the past 6 months and 27 ED visits in the last 6 months. She was admitted for sickle cell anemia pain crisis. She is currently on a Dilaudid PCA. She's been transfused 2 units of PRBCs for worsening anemia. Hospital course complicated by development of fever and evidence of staph aureus bacteremia. Port-A-Cath that was placed earlier this month has been removed. Further workup including TEE did not show any vegetations. Plan is to place PICC line for long-term antibiotics. Other barriers to discharge his pain management since she is still requiring PCA. We'll try and wean off as her condition improves..   Assessment & Plan:   Principal Problem:   Sickle cell crisis (HCC) Active Problems:   Sickle cell disease (HCC)   Elevated LFTs   Hyperbilirubinemia   Thrombocytopenia (HCC)   Chronic pain syndrome   Anemia of chronic disease   Bacteremia due to Staphylococcus aureus  1. Sickle cell pain crisis. Continues to have uncontrolled pain, requiring PCA. Continue current treatments and toradol. She is continued on her long-acting narcotics.  2. Sickle cell anemia. Hemoglobin 6.7 today. She did receive 2 units of PRBCs and 4/5 with improvement of hemoglobin. She received another 2 units on 4/19 in preparation for surgery. Hemoglobin down to 6.7 today. Continue to monitor.  3. Staph aureus bacteremia. Hospital course has been complicated by development of fever. Chest x-ray did not show any significant findings. Blood cultures returned positive for MSSA bacteremia. Discussed with infectious disease who recommended removal of Port-A-Cath which was completed on 4/20. Repeat blood cultures will be drawn 4/19 have shows no growth thus far. TEE did not show any  evidence of vegetations and MRI L spine did not show infection/discitis. Since this is a recurrent bacteremia, she will need 4 weeks of IV antibiotics to be completed on 5/17. Once antibiotics are complete, she will need repeat surveillance cultures to ensure that bacteremia has cleared. Will have PICC line placed in AM and hopefully remove femoral line tomorrow.  4. Thrombocytopenia. Possibly related to crisis. Continue to follow.  5. Chronic hyperbilirubinemia. Likely secondary to hemolysis. Continue to monitor.  6. Lower extremity edema, likely related to aggressive hydration. Fluids have been discontinued. Will give one dose of lasix.   DVT prophylaxis: SCDs Code Status: Full code Family Communication: no family present Disposition Plan: Discharge home once improved.    Consultants:   Cardiology for TEE   general surgery for Port-A-Cath removal  Infectious disease (phone consult)  Procedures:  2D echo: - Left ventricle: The cavity size was normal. Wall thickness was   normal. Systolic function was normal. The estimated ejection   fraction was in the range of 60% to 65%. Wall motion was normal;   there were no regional wall motion abnormalities. The study is   not technically sufficient to allow evaluation of LV diastolic   function. - Aortic valve: Valve area (VTI): 2.19 cm^2. Valve area (Vmax):   2.23 cm^2. Valve area (Vmean): 2.14 cm^2. - Left atrium: The atrium was moderately dilated. - Right atrium: The atrium was mildly dilated. - Pulmonary arteries: Systolic pressure was moderately increased.   PA peak pressure: 37 mm Hg (S). - Pericardium, extracardiac: There is a small circumferential   pericardial effusion.  - Technically adequate  study.   TEE: No evidence of endocarditis. There was no evidence of a    vegetation.    Removal of portacath by Dr. Arnoldo Morale on 4/20  Placement of femoral central line by Dr. Arnoldo Morale on 4/20 .  Antimicrobials:   Ancef 4/18>>  5/17   Subjective: Overall pain is improving. Still requiring pca  Objective: Vitals:   01/12/17 0000 01/12/17 0400 01/12/17 1200 01/12/17 1600  BP: (!) 124/53 114/66 121/65 117/60  Pulse: 95 87 92 95  Resp: 20 20 20 18   Temp: 98.5 F (36.9 C) 98.1 F (36.7 C) 98.4 F (36.9 C) 97.9 F (36.6 C)  TempSrc: Oral Oral Oral Oral  SpO2: 100% 100% 95% 100%  Weight:      Height:        Intake/Output Summary (Last 24 hours) at 01/12/17 1818 Last data filed at 01/12/17 1010  Gross per 24 hour  Intake              240 ml  Output                0 ml  Net              240 ml   Filed Weights   01/02/17 1533 01/02/17 2110  Weight: 76.2 kg (168 lb) 76.2 kg (168 lb)    Examination: General exam: Alert, awake, oriented x 3 Respiratory system: Clear to auscultation. Respiratory effort normal. Cardiovascular system:RRR. No murmurs, rubs, gallops. Gastrointestinal system: Abdomen is nondistended, soft and nontender. No organomegaly or masses felt. Normal bowel sounds heard. Central nervous system: Alert and oriented. No focal neurological deficits. Extremities: 1-2+ pedal edema bilaterally, right femoral central line in place Skin: No rashes, lesions or ulcers Psychiatry: Judgement and insight appear normal. Mood & affect appropriate.    Data Reviewed: I have personally reviewed following labs and imaging studies  CBC:  Recent Labs Lab 01/07/17 0630 01/08/17 0406 01/10/17 0528 01/11/17 0619 01/12/17 0641  WBC 9.3 9.2 15.0* 14.2* 11.2*  NEUTROABS 5.4  --   --   --   --   HGB 7.7* 7.0* 6.7* 7.9* 6.7*  HCT 20.0* 20.2* 19.2* 22.2* 18.8*  MCV 93.5 94.4 97.0 93.7 92.6  PLT 96* 93* 86* 88* 244*   Basic Metabolic Panel:  Recent Labs Lab 01/07/17 0630 01/10/17 0528 01/12/17 0641  NA 138 136 134*  K 3.5 3.2* 3.5  CL 108 102 105  CO2 25 28 23   GLUCOSE 125* 116* 92  BUN 11 11 20   CREATININE 0.45 0.65 0.92  CALCIUM 8.0* 7.6* 7.5*   GFR: Estimated Creatinine Clearance:  82.6 mL/min (by C-G formula based on SCr of 0.92 mg/dL). Liver Function Tests:  Recent Labs Lab 01/07/17 0630  AST 110*  ALT 25  ALKPHOS 112  BILITOT 13.7*  PROT 6.0*  ALBUMIN 2.0*   No results for input(s): LIPASE, AMYLASE in the last 168 hours. No results for input(s): AMMONIA in the last 168 hours. Coagulation Profile: No results for input(s): INR, PROTIME in the last 168 hours. Cardiac Enzymes: No results for input(s): CKTOTAL, CKMB, CKMBINDEX, TROPONINI in the last 168 hours. BNP (last 3 results) No results for input(s): PROBNP in the last 8760 hours. HbA1C: No results for input(s): HGBA1C in the last 72 hours. CBG: No results for input(s): GLUCAP in the last 168 hours. Lipid Profile: No results for input(s): CHOL, HDL, LDLCALC, TRIG, CHOLHDL, LDLDIRECT in the last 72 hours. Thyroid Function Tests: No results for input(s): TSH, T4TOTAL,  FREET4, T3FREE, THYROIDAB in the last 72 hours. Anemia Panel: No results for input(s): VITAMINB12, FOLATE, FERRITIN, TIBC, IRON, RETICCTPCT in the last 72 hours. Sepsis Labs: No results for input(s): PROCALCITON, LATICACIDVEN in the last 168 hours.  Recent Results (from the past 240 hour(s))  Culture, blood (Routine X 2) w Reflex to ID Panel     Status: Abnormal   Collection Time: 01/08/17  3:14 PM  Result Value Ref Range Status   Specimen Description PORTA CATH  Final   Special Requests   Final    BOTTLES DRAWN AEROBIC ONLY Blood Culture adequate volume   Culture  Setup Time   Final    GRAM POSITIVE COCCI IN CLUSTERS Gram Stain Report Called to,Read Back By and Verified With: TETREAULT,H AT 0700 BY HUFFINES,S ON 01/09/17.    Culture STAPHYLOCOCCUS AUREUS (A)  Final   Report Status 01/11/2017 FINAL  Final   Organism ID, Bacteria STAPHYLOCOCCUS AUREUS  Final      Susceptibility   Staphylococcus aureus - MIC*    CIPROFLOXACIN <=0.5 SENSITIVE Sensitive     ERYTHROMYCIN <=0.25 SENSITIVE Sensitive     GENTAMICIN <=0.5 SENSITIVE  Sensitive     OXACILLIN <=0.25 SENSITIVE Sensitive     TETRACYCLINE <=1 SENSITIVE Sensitive     VANCOMYCIN <=0.5 SENSITIVE Sensitive     TRIMETH/SULFA <=10 SENSITIVE Sensitive     CLINDAMYCIN <=0.25 SENSITIVE Sensitive     RIFAMPIN <=0.5 SENSITIVE Sensitive     Inducible Clindamycin NEGATIVE Sensitive     * STAPHYLOCOCCUS AUREUS  Blood Culture ID Panel (Reflexed)     Status: Abnormal   Collection Time: 01/08/17  3:14 PM  Result Value Ref Range Status   Enterococcus species NOT DETECTED NOT DETECTED Final   Listeria monocytogenes NOT DETECTED NOT DETECTED Final   Staphylococcus species DETECTED (A) NOT DETECTED Final    Comment: CRITICAL RESULT CALLED TO, READ BACK BY AND VERIFIED WITH: L. Seay Pharm.D. 10:45 01/09/17 ([email or web address])    Staphylococcus aureus DETECTED (A) NOT DETECTED Final    Comment: Methicillin (oxacillin) susceptible Staphylococcus aureus (MSSA). Preferred therapy is anti staphylococcal beta lactam antibiotic (Cefazolin or Nafcillin), unless clinically contraindicated. CRITICAL RESULT CALLED TO, READ BACK BY AND VERIFIED WITH: L. Seay Pharm.D. 10:45 01/09/17 ([email or web address])    Methicillin resistance NOT DETECTED NOT DETECTED Final   Streptococcus species NOT DETECTED NOT DETECTED Final   Streptococcus agalactiae NOT DETECTED NOT DETECTED Final   Streptococcus pneumoniae NOT DETECTED NOT DETECTED Final   Streptococcus pyogenes NOT DETECTED NOT DETECTED Final   Acinetobacter baumannii NOT DETECTED NOT DETECTED Final   Enterobacteriaceae species NOT DETECTED NOT DETECTED Final   Enterobacter cloacae complex NOT DETECTED NOT DETECTED Final   Escherichia coli NOT DETECTED NOT DETECTED Final   Klebsiella oxytoca NOT DETECTED NOT DETECTED Final   Klebsiella pneumoniae NOT DETECTED NOT DETECTED Final   Proteus species NOT DETECTED NOT DETECTED Final   Serratia marcescens NOT DETECTED NOT DETECTED Final   Haemophilus influenzae NOT DETECTED NOT DETECTED Final   Neisseria meningitidis  NOT DETECTED NOT DETECTED Final   Pseudomonas aeruginosa NOT DETECTED NOT DETECTED Final   Candida albicans NOT DETECTED NOT DETECTED Final   Candida glabrata NOT DETECTED NOT DETECTED Final   Candida krusei NOT DETECTED NOT DETECTED Final   Candida parapsilosis NOT DETECTED NOT DETECTED Final   Candida tropicalis NOT DETECTED NOT DETECTED Final    Comment: Performed at [facility], [address], [place],  [address]  Culture, blood (Routine X 2)  w Reflex to ID Panel     Status: Abnormal   Collection Time: 01/08/17  3:29 PM  Result Value Ref Range Status   Specimen Description BLOOD RIGHT HAND  Final   Special Requests   Final    BOTTLES DRAWN AEROBIC ONLY Blood Culture results may not be optimal due to an inadequate volume of blood received in culture bottles   Culture  Setup Time   Final    GRAM POSITIVE COCCI IN CLUSTERS Gram Stain Report Called to,Read Back By and Verified With: [facility] @ 1427 ON 4.18.18 BY BAYSE,L AEROBIC BOTTLE ONLY CRITICAL VALUE NOTED.  VALUE IS CONSISTENT WITH PREVIOUSLY REPORTED AND CALLED VALUE.    Culture (A)  Final    STAPHYLOCOCCUS AUREUS SUSCEPTIBILITIES PERFORMED ON PREVIOUS CULTURE WITHIN THE LAST 5 DAYS. Performed at [facility], [place] [address] [place], [place] [address]    Report Status 01/11/2017 FINAL  Final  Culture, blood (routine x 2)     Status: None (Preliminary result)   Collection Time: 01/10/17  3:53 PM  Result Value Ref Range Status   Specimen Description BLOOD RIGHT FOREARM  Final   Special Requests   Final    BOTTLES DRAWN AEROBIC AND ANAEROBIC Blood Culture adequate volume   Culture NO GROWTH 2 DAYS  Final   Report Status PENDING  Incomplete  Culture, blood (routine x 2)     Status: None (Preliminary result)   Collection Time: 01/10/17  3:59 PM  Result Value Ref Range Status   Specimen Description BLOOD LEFT HAND  Final   Special Requests   Final    BOTTLES DRAWN AEROBIC AND ANAEROBIC Blood Culture  adequate volume   Culture NO GROWTH 2 DAYS  Final   Report Status PENDING  Incomplete  Surgical pcr screen     Status: Abnormal   Collection Time: 01/11/17 12:46 AM  Result Value Ref Range Status   MRSA, PCR NEGATIVE NEGATIVE Final   Staphylococcus aureus POSITIVE (A) NEGATIVE Final    Comment:        The Xpert SA Assay (FDA approved for NASAL specimens in patients over 49 years of age), is one component of a comprehensive surveillance program.  Test performance has been validated by [facility] for patients greater than or equal to 35 year old. It is not intended to diagnose infection nor to guide or monitor treatment. RESULT CALLED TO, READ BACK BY AND VERIFIED WITH: [facility] AT 0557 BY HUFFINES,S ON 01/11/17.   Cath Tip Culture     Status: None (Preliminary result)   Collection Time: 01/11/17  2:33 PM  Result Value Ref Range Status   Specimen Description PORTA CATH  Final   Special Requests CATH TIP SENT IN SWAB  Final   Culture   Final    CULTURE REINCUBATED FOR BETTER GROWTH Performed at [facility], [place] [address] [place], [place] [address]    Report Status PENDING  Incomplete         Radiology Studies: No results found.      Scheduled Meds: . Chlorhexidine Gluconate Cloth  6 each Topical Daily  . docusate sodium  100 mg Oral BID  . folic acid  1 mg Oral Daily  . gabapentin  100 mg Oral TID  . HYDROmorphone   Intravenous Q4H  . ketorolac  30 mg Intravenous Q6H  . L-glutamine  5 g Oral BID  . loratadine  10 mg Oral Daily  . morphine  15 mg Oral  Q12H  . mupirocin ointment  1 application Nasal BID  . potassium chloride  10 mEq Oral Daily   Continuous Infusions: . sodium chloride 250 mL (01/09/17 1543)  . sodium chloride 10 mL/hr at 01/09/17 1543  . ceFAZolin (ANCEF) IVPB (doses >1 g) 2 g (01/12/17 1200)     LOS: 10 days    Time spent: 34mins    MEMON,JEHANZEB, MD [facility] Pager [phone]  If 7PM-7AM, please contact  night-coverage [email or web address] Password [email or web address] 01/12/2017, 6:18 PM

## 2017-01-12 NOTE — Final Progress Note (Signed)
Notified Dr. Roderic Palau of the patients HGB 6.6 critical results.  Also notified MD of the patient having generalized edema.  New orders given and followed.

## 2017-01-12 NOTE — Anesthesia Postprocedure Evaluation (Signed)
Anesthesia Post Note  Patient: Asharia L Henson  Procedure(s) Performed: Procedure(s) (LRB): TRANSESOPHAGEAL ECHOCARDIOGRAM (TEE) WITH PROPOFOL (N/A) REMOVAL PORT-A-CATH (N/A) INSERTION CENTRAL LINE ADULT  Patient location during evaluation: Nursing Unit Anesthesia Type: MAC Level of consciousness: awake and alert and oriented Pain control: pain management is an ongoing problem. Vital Signs Assessment: post-procedure vital signs reviewed and stable Respiratory status: spontaneous breathing and patient connected to nasal cannula oxygen Cardiovascular status: stable Postop Assessment: no signs of nausea or vomiting Anesthetic complications: no     Last Vitals:  Vitals:   01/12/17 1200 01/12/17 1600  BP: 121/65 117/60  Pulse: 92 95  Resp: 20 18  Temp: 36.9 C 36.6 C    Last Pain:  Vitals:   01/12/17 1600  TempSrc: Oral  PainSc:                  Carriann Hesse

## 2017-01-12 NOTE — Plan of Care (Signed)
Problem: Education: Goal: Knowledge of [facility] Education information/materials will improve Outcome: Progressing Pt educated on MD awareness of swelling on legs. Adv pt we will monitor vital signs as well as edema. Pt verbalized understanding.

## 2017-01-12 NOTE — Progress Notes (Signed)
1 Day Post-Op  Subjective: Patient comfortable.  Objective: Vital signs in last 24 hours: Temp:  [98.1 F (36.7 C)-98.7 F (37.1 C)] 98.1 F (36.7 C) (04/21 0400) Pulse Rate:  [86-103] 87 (04/21 0400) Resp:  [14-21] 20 (04/21 0400) BP: (114-135)/(53-70) 114/66 (04/21 0400) SpO2:  [97 %-100 %] 100 % (04/21 0400) Last BM Date: 01/10/17  Intake/Output from previous day: 04/20 0701 - 04/21 0700 In: 600 [I.V.:600] Out: 2 [Blood:2] Intake/Output this shift: No intake/output data recorded.  General appearance: alert, cooperative and no distress Skin: Right upper chest incision site without hematoma.  Lab Results:   Recent Labs  01/10/17 0528 01/11/17 0619  WBC 15.0* 14.2*  HGB 6.7* 7.9*  HCT 19.2* 22.2*  PLT 86* 88*   BMET  Recent Labs  01/10/17 0528  NA 136  K 3.2*  CL 102  CO2 28  GLUCOSE 116*  BUN 11  CREATININE 0.65  CALCIUM 7.6*   PT/INR No results for input(s): LABPROT, INR in the last 72 hours.  Studies/Results: Mr Lumbar Spine Wo Contrast  Result Date: 01/10/2017 CLINICAL DATA:  Persistent low back pain. Fever and white count. Assess for spinal infection. EXAM: MRI LUMBAR SPINE WITHOUT CONTRAST TECHNIQUE: Multiplanar, multisequence MR imaging of the lumbar spine was performed. No intravenous contrast was administered. COMPARISON:  None. FINDINGS: Segmentation:  Normal Alignment:  Normal Vertebrae:  Normal Conus medullaris: Extends to the L1 level and appears normal. Paraspinal and other soft tissues: Normal Disc levels: Normal. No degenerative disc disease. No facet arthropathy. No bone, joint or soft tissue edema. IMPRESSION: Normal exam. No abnormality seen to explain pain. No sign of regional infection. Electronically Signed   By: Nelson Chimes M.D.   On: 01/10/2017 17:19    Anti-infectives: Anti-infectives    Start     Dose/Rate Route Frequency Ordered Stop   01/09/17 2000  ceFAZolin (ANCEF) IVPB 1 g/50 mL premix - [place]  Status:  Discontinued     2  g 200 mL/hr over 30 Minutes Intravenous Every 8 hours 01/09/17 1156 01/09/17 1157   01/09/17 2000  ceFAZolin (ANCEF) 2 g in dextrose 5 % 100 mL IVPB     2 g 200 mL/hr over 30 Minutes Intravenous Every 8 hours 01/09/17 1157     01/09/17 1800  vancomycin (VANCOCIN) IVPB 750 mg/150 ml premix  Status:  Discontinued     750 mg 150 mL/hr over 60 Minutes Intravenous Every 8 hours 01/09/17 0924 01/09/17 1156   01/09/17 1000  vancomycin (VANCOCIN) 1,500 mg in sodium chloride 0.9 % 500 mL IVPB     1,500 mg 250 mL/hr over 120 Minutes Intravenous  Once 01/09/17 0924 01/09/17 1236   01/03/17 1300  metroNIDAZOLE (FLAGYL) tablet 500 mg     500 mg Oral 2 times daily 01/03/17 1255        Assessment/Plan: s/p Procedure(s): TRANSESOPHAGEAL ECHOCARDIOGRAM (TEE) WITH PROPOFOL REMOVAL PORT-A-CATH INSERTION CENTRAL LINE ADULT Impression: Stable postoperatively. We will continue right femoral central line until PICC line placed.  LOS: 10 days    Aviva Signs 01/12/2017

## 2017-01-12 NOTE — Progress Notes (Signed)
RN spoke with Dr. Olevia Bowens about pts generalized edema and Lasix not working. Dr. Olevia Bowens stated he did not want to order anything else at the moment d/t her VSS. Stated he did not want to dehydrate her and put her back in sickle cell crisis. RN explained this to pt and pt not happy about decision. Adv pt we will monitor her and page MD again if needed.

## 2017-01-13 DIAGNOSIS — N179 Acute kidney failure, unspecified: Secondary | ICD-10-CM

## 2017-01-13 LAB — CBC
HCT: 17.3 % — ABNORMAL LOW (ref 36.0–46.0)
HEMOGLOBIN: 6.6 g/dL — AB (ref 12.0–15.0)
MCH: 32.3 pg (ref 26.0–34.0)
MCHC: 35.8 g/dL (ref 30.0–36.0)
MCV: 90.1 fL (ref 78.0–100.0)
Platelets: 136 10*3/uL — ABNORMAL LOW (ref 150–400)
RBC: 1.92 MIL/uL — AB (ref 3.87–5.11)
RDW: 19.8 % — ABNORMAL HIGH (ref 11.5–15.5)
WBC: 14.5 10*3/uL — ABNORMAL HIGH (ref 4.0–10.5)

## 2017-01-13 LAB — COMPREHENSIVE METABOLIC PANEL WITH GFR
ALT: 11 U/L — ABNORMAL LOW (ref 14–54)
AST: 101 U/L — ABNORMAL HIGH (ref 15–41)
Albumin: 1.5 g/dL — ABNORMAL LOW (ref 3.5–5.0)
Alkaline Phosphatase: 121 U/L (ref 38–126)
Anion gap: 6 (ref 5–15)
BUN: 28 mg/dL — ABNORMAL HIGH (ref 6–20)
CO2: 24 mmol/L (ref 22–32)
Calcium: 7.6 mg/dL — ABNORMAL LOW (ref 8.9–10.3)
Chloride: 106 mmol/L (ref 101–111)
Creatinine, Ser: 1.4 mg/dL — ABNORMAL HIGH (ref 0.44–1.00)
GFR calc Af Amer: 55 mL/min — ABNORMAL LOW
GFR calc non Af Amer: 48 mL/min — ABNORMAL LOW
Glucose, Bld: 96 mg/dL (ref 65–99)
Potassium: 4 mmol/L (ref 3.5–5.1)
Sodium: 136 mmol/L (ref 135–145)
Total Bilirubin: 16.7 mg/dL — ABNORMAL HIGH (ref 0.3–1.2)
Total Protein: 5.3 g/dL — ABNORMAL LOW (ref 6.5–8.1)

## 2017-01-13 LAB — BPAM RBC
BLOOD PRODUCT EXPIRATION DATE: 201805012359
BLOOD PRODUCT EXPIRATION DATE: 201805042359
ISSUE DATE / TIME: 201804200202
ISSUE DATE / TIME: 201804192045
Unit Type and Rh: 6200
Unit Type and Rh: 6200

## 2017-01-13 LAB — TYPE AND SCREEN
ABO/RH(D): A POS
Antibody Screen: NEGATIVE
UNIT DIVISION: 0
Unit division: 0

## 2017-01-13 NOTE — Progress Notes (Signed)
Dr. Alcario Drought (MD on call) paged and made aware that pt is still c/o tightness and swelling in upper and lower ext. VSS, adv pt received one dose Lasix earlier with no relief. Waiting for orders.

## 2017-01-13 NOTE — Progress Notes (Signed)
Pt states she is very upset with care today from RN and doctor. States she "feels like she is getting worse from being here instead of better, no one wants to do anything about her swelling and now her kidneys are starting to go bad." Pt wants RN to page Dr. Roderic Palau before leaving at 0700 and ask for transfer to [facility]. Family in room and agree with pt.

## 2017-01-13 NOTE — Progress Notes (Signed)
Pt states she feels like her hands are swelling and needs to have Lasix to get fluid off. States she doesn't understand how she will get dehydrated when she has so much fluid on her to get rid of. Adv pt that her Vital signs are stable, her lower ext look the same as they did on her original assessment as well as her upper extremities. Also adv pt that she isn't getting any fluids except her IV abx unless she is drinking a lot. Pt stated she only had 1 cup of ice. Adv pt we would continue to monitor and I would re page the doctor if appropriate.

## 2017-01-13 NOTE — Final Progress Note (Signed)
Vascular team was notified of patient requiring a PICC line.

## 2017-01-13 NOTE — Progress Notes (Signed)
Dr. Alcario Drought went in and spoke directly with pt and adv her that he wanted to wait on the Lasix and let the day team decide. He stated that he may not completely dehydrate her because she does have +2 edema but he doesn't want to take the chance of sending her into sicklecell crisis. Dr. Lydia Guiles that we would check her kidney function in the morning and leave it up to Dr. Roderic Palau.

## 2017-01-13 NOTE — Progress Notes (Signed)
PROGRESS NOTE    WEDNESDAY Dawn [name]  QVZ:[id number] DOB: 1981/04/26 DOA: 01/02/2017 PCP: [name], FNP    Brief Narrative:  36 year old female with history of sickle cell anemia, presented with sickle cell crisis. She's had 13 admissions in the past 6 months and 27 ED visits in the last 6 months. She was admitted for sickle cell anemia pain crisis. She is currently on a Dilaudid PCA. She's been transfused 2 units of PRBCs for worsening anemia. Hospital course complicated by development of fever and evidence of staph aureus bacteremia. Port-A-Cath that was placed earlier this month has been removed. Further workup including TEE did not show any vegetations. Plan is to place PICC line for long-term antibiotics. Other barriers to discharge his pain management since she is still requiring PCA. We'll try and wean off as her condition improves..   Assessment & Plan:   Principal Problem:   Sickle cell crisis (HCC) Active Problems:   Sickle cell disease (HCC)   Elevated LFTs   Hyperbilirubinemia   Thrombocytopenia (HCC)   Chronic pain syndrome   Anemia of chronic disease   Bacteremia due to Staphylococcus aureus  1. Sickle cell pain crisis. Continues to have uncontrolled pain, requiring PCA. Continue current treatments and toradol. She is continued on her long-acting narcotics.  2. Sickle cell anemia. Hemoglobin 6.7 today. She did receive 2 units of PRBCs and 4/5 with improvement of hemoglobin. She received another 2 units on 4/19 in preparation for surgery. Hemoglobin down to 6.6 today. Continue to monitor.  3. Staph aureus bacteremia. Hospital course has been complicated by development of fever. Chest x-ray did not show any significant findings. Blood cultures returned positive for MSSA bacteremia. Discussed with infectious disease who recommended removal of Port-A-Cath which was completed on 4/20. Repeat blood cultures will be drawn 4/19 have shows no growth thus far. TEE did not show any  evidence of vegetations and MRI L spine did not show infection/discitis. Since this is a recurrent bacteremia, she will need 4 weeks of IV antibiotics to be completed on 5/17. Once antibiotics are complete, she will need repeat surveillance cultures to ensure that bacteremia has cleared. Place PICC line today after which femoral line can be removed  4. Thrombocytopenia. Possibly related to crisis. Continue to follow.  5. Chronic hyperbilirubinemia. Likely secondary to hemolysis. Continue to monitor.  6. Lower extremity edema, likely related to aggressive hydration and hypoalbuminemia. Fluids have been discontinued. Will need to give albumin and lasix if renal function stabilizes  7. AKI. Possibly related to toradol and lasix. toradol has been discontinued. Hold off on further lasix for now. Recheck labs in AM  8. Hypoalbuminemia. No proteinuria on urinalysis. Likely related to poor po intake. Nutrition consult   DVT prophylaxis: SCDs Code Status: Full code Family Communication: no family present Disposition Plan: Discharge home once improved.    Consultants:   Cardiology for TEE   general surgery for Port-A-Cath removal  Infectious disease (phone consult)  Procedures:  2D echo: - Left ventricle: The cavity size was normal. Wall thickness was   normal. Systolic function was normal. The estimated ejection   fraction was in the range of 60% to 65%. Wall motion was normal;   there were no regional wall motion abnormalities. The study is   not technically sufficient to allow evaluation of LV diastolic   function. - Aortic valve: Valve area (VTI): 2.19 cm^2. Valve area (Vmax):   2.23 cm^2. Valve area (Vmean): 2.14 cm^2. - Left atrium: The atrium  was moderately dilated. - Right atrium: The atrium was mildly dilated. - Pulmonary arteries: Systolic pressure was moderately increased.   PA peak pressure: 37 mm Hg (S). - Pericardium, extracardiac: There is a small circumferential    pericardial effusion.  - Technically adequate study.   TEE: No evidence of endocarditis. There was no evidence of a    vegetation.    Removal of portacath by Dr. Arnoldo Morale on 4/20  Placement of femoral central line by Dr. Arnoldo Morale on 4/20 .  Antimicrobials:   Ancef 4/18>> 5/17   Subjective: She is very concerned about anasarca. Her pain appears to be stable. She did not have much urine output after receiving lasix yesterday  Objective: Vitals:   01/13/17 0005 01/13/17 0410 01/13/17 0810 01/13/17 1230  BP: 130/63 (!) 124/50 127/61 129/62  Pulse: 100 93 94 94  Resp: 19 18 18 18   Temp: 98.7 F (37.1 C) 98.6 F (37 C) 98.5 F (36.9 C) 98.2 F (36.8 C)  TempSrc: Oral Oral Oral Oral  SpO2: 100% 100% 100% 100%  Weight:      Height:        Intake/Output Summary (Last 24 hours) at 01/13/17 1835 Last data filed at 01/13/17 0322  Gross per 24 hour  Intake             2774 ml  Output                0 ml  Net             2774 ml   Filed Weights   01/02/17 1533 01/02/17 2110  Weight: 76.2 kg (168 lb) 76.2 kg (168 lb)    Examination: General exam: Alert, awake, oriented x 3 Respiratory system: Clear to auscultation. Respiratory effort normal. Cardiovascular system:RRR. No murmurs, rubs, gallops. Gastrointestinal system: Abdomen is nondistended, soft and nontender. No organomegaly or masses felt. Normal bowel sounds heard. Central nervous system: Alert and oriented. No focal neurological deficits. Extremities: 1-2+ edema throughout, anasarca Skin: No rashes, lesions or ulcers Psychiatry: Judgement and insight appear normal. Mood & affect appropriate.      Data Reviewed: I have personally reviewed following labs and imaging studies  CBC:  Recent Labs Lab 01/07/17 0630 01/08/17 0406 01/10/17 0528 01/11/17 0619 01/12/17 0641 01/13/17 0610  WBC 9.3 9.2 15.0* 14.2* 11.2* 14.5*  NEUTROABS 5.4  --   --   --   --   --   HGB 7.7* 7.0* 6.7* 7.9* 6.7* 6.6*  HCT 20.0*  20.2* 19.2* 22.2* 18.8* 17.3*  MCV 93.5 94.4 97.0 93.7 92.6 90.1  PLT 96* 93* 86* 88* 127* 937*   Basic Metabolic Panel:  Recent Labs Lab 01/07/17 0630 01/10/17 0528 01/12/17 0641 01/13/17 0610  NA 138 136 134* 136  K 3.5 3.2* 3.5 4.0  CL 108 102 105 106  CO2 25 28 23 24   GLUCOSE 125* 116* 92 96  BUN 11 11 20  28*  CREATININE 0.45 0.65 0.92 1.40*  CALCIUM 8.0* 7.6* 7.5* 7.6*   GFR: Estimated Creatinine Clearance: 54.3 mL/min (A) (by C-G formula based on SCr of 1.4 mg/dL (H)). Liver Function Tests:  Recent Labs Lab 01/07/17 0630 01/13/17 0610  AST 110* 101*  ALT 25 11*  ALKPHOS 112 121  BILITOT 13.7* 16.7*  PROT 6.0* 5.3*  ALBUMIN 2.0* 1.5*   No results for input(s): LIPASE, AMYLASE in the last 168 hours. No results for input(s): AMMONIA in the last 168 hours. Coagulation Profile: No results for input(s):  INR, PROTIME in the last 168 hours. Cardiac Enzymes: No results for input(s): CKTOTAL, CKMB, CKMBINDEX, TROPONINI in the last 168 hours. BNP (last 3 results) No results for input(s): PROBNP in the last 8760 hours. HbA1C: No results for input(s): HGBA1C in the last 72 hours. CBG: No results for input(s): GLUCAP in the last 168 hours. Lipid Profile: No results for input(s): CHOL, HDL, LDLCALC, TRIG, CHOLHDL, LDLDIRECT in the last 72 hours. Thyroid Function Tests: No results for input(s): TSH, T4TOTAL, FREET4, T3FREE, THYROIDAB in the last 72 hours. Anemia Panel: No results for input(s): VITAMINB12, FOLATE, FERRITIN, TIBC, IRON, RETICCTPCT in the last 72 hours. Sepsis Labs: No results for input(s): PROCALCITON, LATICACIDVEN in the last 168 hours.  Recent Results (from the past 240 hour(s))  Culture, blood (Routine X 2) w Reflex to ID Panel     Status: Abnormal   Collection Time: 01/08/17  3:14 PM  Result Value Ref Range Status   Specimen Description PORTA CATH  Final   Special Requests   Final    BOTTLES DRAWN AEROBIC ONLY Blood Culture adequate volume    Culture  Setup Time   Final    GRAM POSITIVE COCCI IN CLUSTERS Gram Stain Report Called to,Read Back By and Verified With: [facility]REAULT,H AT 0700 BY HUFFINES,S ON 01/09/17.    Culture STAPHYLOCOCCUS AUREUS (A)  Final   Report Status 01/11/2017 FINAL  Final   Organism ID, Bacteria STAPHYLOCOCCUS AUREUS  Final      Susceptibility   Staphylococcus aureus - MIC*    CIPROFLOXACIN <=0.5 SENSITIVE Sensitive     ERYTHROMYCIN <=0.25 SENSITIVE Sensitive     GENTAMICIN <=0.5 SENSITIVE Sensitive     OXACILLIN <=0.25 SENSITIVE Sensitive     TETRACYCLINE <=1 SENSITIVE Sensitive     VANCOMYCIN <=0.5 SENSITIVE Sensitive     TRIMETH/SULFA <=10 SENSITIVE Sensitive     CLINDAMYCIN <=0.25 SENSITIVE Sensitive     RIFAMPIN <=0.5 SENSITIVE Sensitive     Inducible Clindamycin NEGATIVE Sensitive     * STAPHYLOCOCCUS AUREUS  Blood Culture ID Panel (Reflexed)     Status: Abnormal   Collection Time: 01/08/17  3:14 PM  Result Value Ref Range Status   Enterococcus species NOT DETECTED NOT DETECTED Final   Listeria monocytogenes NOT DETECTED NOT DETECTED Final   Staphylococcus species DETECTED (A) NOT DETECTED Final    Comment: CRITICAL RESULT CALLED TO, READ BACK BY AND VERIFIED WITH: L. Seay Pharm.D. 10:45 01/09/17 ([email or web address])    Staphylococcus aureus DETECTED (A) NOT DETECTED Final    Comment: Methicillin (oxacillin) susceptible Staphylococcus aureus (MSSA). Preferred therapy is anti staphylococcal beta lactam antibiotic (Cefazolin or Nafcillin), unless clinically contraindicated. CRITICAL RESULT CALLED TO, READ BACK BY AND VERIFIED WITH: L. Seay Pharm.D. 10:45 01/09/17 ([email or web address])    Methicillin resistance NOT DETECTED NOT DETECTED Final   Streptococcus species NOT DETECTED NOT DETECTED Final   Streptococcus agalactiae NOT DETECTED NOT DETECTED Final   Streptococcus pneumoniae NOT DETECTED NOT DETECTED Final   Streptococcus pyogenes NOT DETECTED NOT DETECTED Final   Acinetobacter baumannii NOT DETECTED NOT  DETECTED Final   Enterobacteriaceae species NOT DETECTED NOT DETECTED Final   Enterobacter cloacae complex NOT DETECTED NOT DETECTED Final   Escherichia coli NOT DETECTED NOT DETECTED Final   Klebsiella oxytoca NOT DETECTED NOT DETECTED Final   Klebsiella pneumoniae NOT DETECTED NOT DETECTED Final   Proteus species NOT DETECTED NOT DETECTED Final   Serratia marcescens NOT DETECTED NOT DETECTED Final   Haemophilus influenzae NOT DETECTED NOT DETECTED Final  Neisseria meningitidis NOT DETECTED NOT DETECTED Final   Pseudomonas aeruginosa NOT DETECTED NOT DETECTED Final   Candida albicans NOT DETECTED NOT DETECTED Final   Candida glabrata NOT DETECTED NOT DETECTED Final   Candida krusei NOT DETECTED NOT DETECTED Final   Candida parapsilosis NOT DETECTED NOT DETECTED Final   Candida tropicalis NOT DETECTED NOT DETECTED Final    Comment: Performed at [facility], [place] [address] [place], [place] [address]  Culture, blood (Routine X 2) w Reflex to ID Panel     Status: Abnormal   Collection Time: 01/08/17  3:29 PM  Result Value Ref Range Status   Specimen Description BLOOD RIGHT HAND  Final   Special Requests   Final    BOTTLES DRAWN AEROBIC ONLY Blood Culture results may not be optimal due to an inadequate volume of blood received in culture bottles   Culture  Setup Time   Final    GRAM POSITIVE COCCI IN CLUSTERS Gram Stain Report Called to,Read Back By and Verified With: [facility] @ 1427 ON 4.18.18 BY BAYSE,L AEROBIC BOTTLE ONLY CRITICAL VALUE NOTED.  VALUE IS CONSISTENT WITH PREVIOUSLY REPORTED AND CALLED VALUE.    Culture (A)  Final    STAPHYLOCOCCUS AUREUS SUSCEPTIBILITIES PERFORMED ON PREVIOUS CULTURE WITHIN THE LAST 5 DAYS. Performed at [facility], [place] [address] [place], [place] [address]    Report Status 01/11/2017 FINAL  Final  Culture, blood (routine x 2)     Status: None (Preliminary result)   Collection Time: 01/10/17  3:53 PM  Result Value Ref Range Status     Specimen Description BLOOD RIGHT FOREARM  Final   Special Requests   Final    BOTTLES DRAWN AEROBIC AND ANAEROBIC Blood Culture adequate volume   Culture NO GROWTH 3 DAYS  Final   Report Status PENDING  Incomplete  Culture, blood (routine x 2)     Status: None (Preliminary result)   Collection Time: 01/10/17  3:59 PM  Result Value Ref Range Status   Specimen Description BLOOD LEFT HAND  Final   Special Requests   Final    BOTTLES DRAWN AEROBIC AND ANAEROBIC Blood Culture adequate volume   Culture NO GROWTH 3 DAYS  Final   Report Status PENDING  Incomplete  Surgical pcr screen     Status: Abnormal   Collection Time: 01/11/17 12:46 AM  Result Value Ref Range Status   MRSA, PCR NEGATIVE NEGATIVE Final   Staphylococcus aureus POSITIVE (A) NEGATIVE Final    Comment:        The Xpert SA Assay (FDA approved for NASAL specimens in patients over 33 years of age), is one component of a comprehensive surveillance program.  Test performance has been validated by [facility] for patients greater than or equal to 88 year old. It is not intended to diagnose infection nor to guide or monitor treatment. RESULT CALLED TO, READ BACK BY AND VERIFIED WITH: [facility] AT 0557 BY HUFFINES,S ON 01/11/17.   Cath Tip Culture     Status: Abnormal (Preliminary result)   Collection Time: 01/11/17  2:33 PM  Result Value Ref Range Status   Specimen Description PORTA CATH  Final   Special Requests   Final    CATH TIP SENT IN SWAB Performed at [facility], [place] [address], [place], [place] [address]    Culture <10,000 COLONIES/mL STAPHYLOCOCCUS AUREUS (A)  Final   Report Status PENDING  Incomplete  Radiology Studies: No results found.      Scheduled Meds: . Chlorhexidine Gluconate Cloth  6 each Topical Daily  . docusate sodium  100 mg Oral BID  . folic acid  1 mg Oral Daily  . gabapentin  100 mg Oral TID  . HYDROmorphone   Intravenous Q4H  . L-glutamine  5 g Oral BID  .  loratadine  10 mg Oral Daily  . morphine  15 mg Oral Q12H  . mupirocin ointment  1 application Nasal BID  . potassium chloride  10 mEq Oral Daily   Continuous Infusions: . sodium chloride 250 mL (01/09/17 1543)  . sodium chloride 10 mL/hr at 01/09/17 1543  . ceFAZolin (ANCEF) IVPB (doses >1 g) 2 g (01/13/17 1200)     LOS: 11 days    Time spent: 70mins    Machel Violante, MD [facility] Pager [phone]  If 7PM-7AM, please contact night-coverage [email or web address] Password [facility] 01/13/2017, 6:35 PM

## 2017-01-14 LAB — CBC
HEMATOCRIT: 17.5 % — AB (ref 36.0–46.0)
HEMOGLOBIN: 6.2 g/dL — AB (ref 12.0–15.0)
MCH: 32.5 pg (ref 26.0–34.0)
MCHC: 35.4 g/dL (ref 30.0–36.0)
MCV: 91.6 fL (ref 78.0–100.0)
Platelets: 189 10*3/uL (ref 150–400)
RBC: 1.91 MIL/uL — AB (ref 3.87–5.11)
RDW: 20.6 % — ABNORMAL HIGH (ref 11.5–15.5)
WBC: 16.8 10*3/uL — AB (ref 4.0–10.5)

## 2017-01-14 LAB — BASIC METABOLIC PANEL
ANION GAP: 6 (ref 5–15)
BUN: 28 mg/dL — ABNORMAL HIGH (ref 6–20)
CO2: 23 mmol/L (ref 22–32)
Calcium: 7.8 mg/dL — ABNORMAL LOW (ref 8.9–10.3)
Chloride: 110 mmol/L (ref 101–111)
Creatinine, Ser: 0.97 mg/dL (ref 0.44–1.00)
GLUCOSE: 103 mg/dL — AB (ref 65–99)
POTASSIUM: 4.1 mmol/L (ref 3.5–5.1)
Sodium: 139 mmol/L (ref 135–145)

## 2017-01-14 LAB — CATH TIP CULTURE: Culture: <10000 — AB

## 2017-01-14 MED ORDER — ALBUMIN HUMAN 25 % IV SOLN
25.0000 g | Freq: Two times a day (BID) | INTRAVENOUS | Status: AC
  Administered 2017-01-14 – 2017-01-15 (×3): 25 g via INTRAVENOUS
  Filled 2017-01-14 (×4): qty 100

## 2017-01-14 MED ORDER — FUROSEMIDE 10 MG/ML IJ SOLN
40.0000 mg | Freq: Two times a day (BID) | INTRAMUSCULAR | Status: DC
  Administered 2017-01-14 – 2017-01-17 (×6): 40 mg via INTRAVENOUS
  Filled 2017-01-14 (×6): qty 4

## 2017-01-14 MED ORDER — PRO-STAT SUGAR FREE PO LIQD
30.0000 mL | Freq: Two times a day (BID) | ORAL | Status: DC
  Administered 2017-01-14 – 2017-01-25 (×19): 30 mL via ORAL
  Filled 2017-01-14 (×20): qty 30

## 2017-01-14 NOTE — Progress Notes (Signed)
RN asked for pt for verification purposes if she still wanted RN to page Dr. Roderic Palau right at shift change to advise him of her wanting to transfer to [facility]; pt stated not to worry about paging him. She stated she would talk to him when he made rounds.

## 2017-01-14 NOTE — Progress Notes (Signed)
  Femoral line removed by Pershing Proud

## 2017-01-14 NOTE — Progress Notes (Signed)
PROGRESS NOTE    Dawn [name]  AUQ:[id number] DOB: 1980-10-20 DOA: 01/02/2017 PCP: [name], FNP    Brief Narrative:  36 year old female with history of sickle cell anemia, presented with sickle cell crisis. She's had 13 admissions in the past 6 months and 27 ED visits in the last 6 months. She was admitted for sickle cell anemia pain crisis. She is currently on a Dilaudid PCA. She's been transfused 2 units of PRBCs for worsening anemia. Hospital course complicated by development of fever and evidence of staph aureus bacteremia. Port-A-Cath that was placed earlier this month has been removed. Further workup including TEE did not show any vegetations. Plan is to place PICC line for long-term antibiotics. Other barriers to discharge his pain management since she is still requiring PCA. We'll try and wean off as her condition improves..   Assessment & Plan:   Principal Problem:   Sickle cell crisis (HCC) Active Problems:   Sickle cell disease (HCC)   Elevated LFTs   Hyperbilirubinemia   Thrombocytopenia (HCC)   Chronic pain syndrome   Anemia of chronic disease   Bacteremia due to Staphylococcus aureus  1. Sickle cell pain crisis. Continues to have uncontrolled pain, requiring PCA. Continue current treatments. She is continued on her long-acting narcotics.  2. Sickle cell anemia. Hemoglobin 6.7 today. She did receive 2 units of PRBCs and 4/5 with improvement of hemoglobin. She received another 2 units on 4/19 in preparation for surgery. Hemoglobin down to 6.2 today. She does not appear to be symptomatic. Likely transfuse for hemoglobin less than 6. Continue to monitor.  3. Staph aureus bacteremia. Hospital course has been complicated by development of fever. Chest x-ray did not show any significant findings. Blood cultures returned positive for MSSA bacteremia. Discussed with infectious disease who recommended removal of Port-A-Cath which was completed on 4/20. Repeat blood cultures  will be drawn 4/19 have shows no growth thus far. TEE did not show any evidence of vegetations and MRI L spine did not show infection/discitis. Since this is a recurrent bacteremia, she will need 4 weeks of IV antibiotics to be completed on 5/17. Once antibiotics are complete, she will need repeat surveillance cultures to ensure that bacteremia has cleared.   4. Thrombocytopenia. Possibly related to crisis. Improved   5. Chronic hyperbilirubinemia. Likely secondary to hemolysis. No abdominal pain, nausea or vomiting. Continue to monitor.  6. Lower extremity edema, likely related to aggressive hydration and hypoalbuminemia. Fluids have been discontinued. Since renal function is better today, we will start her on Lasix and albumin.  7. AKI. Possibly related to toradol and lasix. toradol has been discontinued. We'll function is better today. Continue to monitor  8. Hypoalbuminemia. No proteinuria on urinalysis. Likely related to poor po intake. Nutrition consult   DVT prophylaxis: SCDs Code Status: Full code Family Communication: Discussed with mother at the bedside Disposition Plan: Discharge home once improved.    Consultants:   Cardiology for TEE   general surgery for Port-A-Cath removal  Infectious disease (phone consult)  Procedures:  2D echo: - Left ventricle: The cavity size was normal. Wall thickness was   normal. Systolic function was normal. The estimated ejection   fraction was in the range of 60% to 65%. Wall motion was normal;   there were no regional wall motion abnormalities. The study is   not technically sufficient to allow evaluation of LV diastolic   function. - Aortic valve: Valve area (VTI): 2.19 cm^2. Valve area (Vmax):   2.23 cm^2.  Valve area (Vmean): 2.14 cm^2. - Left atrium: The atrium was moderately dilated. - Right atrium: The atrium was mildly dilated. - Pulmonary arteries: Systolic pressure was moderately increased.   PA peak pressure: 37 mm Hg (S). -  Pericardium, extracardiac: There is a small circumferential   pericardial effusion.  - Technically adequate study.   TEE: No evidence of endocarditis. There was no evidence of a    vegetation.    Removal of portacath by Dr. Arnoldo Morale on 4/20  Placement of femoral central line by Dr. Arnoldo Morale on 4/20 .  Antimicrobials:   Ancef 4/18>> 5/17   Subjective: Patient is somewhat frustrated regarding her slow progress. She has requested a possible transfer to [facility]. She continues to have pain. Continues to have generalized swelling.  Objective: Vitals:   01/14/17 1139 01/14/17 1300 01/14/17 1617 01/14/17 1840  BP:  (!) 123/58    Pulse:  (!) 108    Resp: (!) 25 20 (!) 26   Temp:  100 F (37.8 C)    TempSrc:  Oral    SpO2: 99% 100% 100%   Weight:    88.9 kg (196 lb)  Height:        Intake/Output Summary (Last 24 hours) at 01/14/17 1854 Last data filed at 01/14/17 1800  Gross per 24 hour  Intake             1480 ml  Output             4425 ml  Net            -2945 ml   Filed Weights   01/02/17 1533 01/02/17 2110 01/14/17 1840  Weight: 76.2 kg (168 lb) 76.2 kg (168 lb) 88.9 kg (196 lb)    Examination: General exam: Alert, awake, oriented x 3 Respiratory system: Clear to auscultation. Respiratory effort normal. Cardiovascular system:RRR. No murmurs, rubs, gallops. Gastrointestinal system: Abdomen is nondistended, soft and nontender. No organomegaly or masses felt. Normal bowel sounds heard. Central nervous system: Alert and oriented. No focal neurological deficits. Extremities: 1-2+ anasarca Skin: No rashes, lesions or ulcers Psychiatry: Judgement and insight appear normal. Mood & affect appropriate.       Data Reviewed: I have personally reviewed following labs and imaging studies  CBC:  Recent Labs Lab 01/10/17 0528 01/11/17 0619 01/12/17 0641 01/13/17 0610 01/14/17 0507  WBC 15.0* 14.2* 11.2* 14.5* 16.8*  HGB 6.7* 7.9* 6.7* 6.6* 6.2*  HCT 19.2*  22.2* 18.8* 17.3* 17.5*  MCV 97.0 93.7 92.6 90.1 91.6  PLT 86* 88* 127* 136* 149   Basic Metabolic Panel:  Recent Labs Lab 01/10/17 0528 01/12/17 0641 01/13/17 0610 01/14/17 0507  NA 136 134* 136 139  K 3.2* 3.5 4.0 4.1  CL 102 105 106 110  CO2 28 23 24 23   GLUCOSE 116* 92 96 103*  BUN 11 20 28* 28*  CREATININE 0.65 0.92 1.40* 0.97  CALCIUM 7.6* 7.5* 7.6* 7.8*   GFR: Estimated Creatinine Clearance: 84.8 mL/min (by C-G formula based on SCr of 0.97 mg/dL). Liver Function Tests:  Recent Labs Lab 01/13/17 0610  AST 101*  ALT 11*  ALKPHOS 121  BILITOT 16.7*  PROT 5.3*  ALBUMIN 1.5*   No results for input(s): LIPASE, AMYLASE in the last 168 hours. No results for input(s): AMMONIA in the last 168 hours. Coagulation Profile: No results for input(s): INR, PROTIME in the last 168 hours. Cardiac Enzymes: No results for input(s): CKTOTAL, CKMB, CKMBINDEX, TROPONINI in the last 168 hours. BNP (  last 3 results) No results for input(s): PROBNP in the last 8760 hours. HbA1C: No results for input(s): HGBA1C in the last 72 hours. CBG: No results for input(s): GLUCAP in the last 168 hours. Lipid Profile: No results for input(s): CHOL, HDL, LDLCALC, TRIG, CHOLHDL, LDLDIRECT in the last 72 hours. Thyroid Function Tests: No results for input(s): TSH, T4TOTAL, FREET4, T3FREE, THYROIDAB in the last 72 hours. Anemia Panel: No results for input(s): VITAMINB12, FOLATE, FERRITIN, TIBC, IRON, RETICCTPCT in the last 72 hours. Sepsis Labs: No results for input(s): PROCALCITON, LATICACIDVEN in the last 168 hours.  Recent Results (from the past 240 hour(s))  Culture, blood (Routine X 2) w Reflex to ID Panel     Status: Abnormal   Collection Time: 01/08/17  3:14 PM  Result Value Ref Range Status   Specimen Description PORTA CATH  Final   Special Requests   Final    BOTTLES DRAWN AEROBIC ONLY Blood Culture adequate volume   Culture  Setup Time   Final    GRAM POSITIVE COCCI IN  CLUSTERS Gram Stain Report Called to,Read Back By and Verified With: TETREAULT,H AT 0700 BY HUFFINES,S ON 01/09/17.    Culture STAPHYLOCOCCUS AUREUS (A)  Final   Report Status 01/11/2017 FINAL  Final   Organism ID, Bacteria STAPHYLOCOCCUS AUREUS  Final      Susceptibility   Staphylococcus aureus - MIC*    CIPROFLOXACIN <=0.5 SENSITIVE Sensitive     ERYTHROMYCIN <=0.25 SENSITIVE Sensitive     GENTAMICIN <=0.5 SENSITIVE Sensitive     OXACILLIN <=0.25 SENSITIVE Sensitive     TETRACYCLINE <=1 SENSITIVE Sensitive     VANCOMYCIN <=0.5 SENSITIVE Sensitive     TRIMETH/SULFA <=10 SENSITIVE Sensitive     CLINDAMYCIN <=0.25 SENSITIVE Sensitive     RIFAMPIN <=0.5 SENSITIVE Sensitive     Inducible Clindamycin NEGATIVE Sensitive     * STAPHYLOCOCCUS AUREUS  Blood Culture ID Panel (Reflexed)     Status: Abnormal   Collection Time: 01/08/17  3:14 PM  Result Value Ref Range Status   Enterococcus species NOT DETECTED NOT DETECTED Final   Listeria monocytogenes NOT DETECTED NOT DETECTED Final   Staphylococcus species DETECTED (A) NOT DETECTED Final    Comment: CRITICAL RESULT CALLED TO, READ BACK BY AND VERIFIED WITH: L. Seay Pharm.D. 10:45 01/09/17 ([email or web address])    Staphylococcus aureus DETECTED (A) NOT DETECTED Final    Comment: Methicillin (oxacillin) susceptible Staphylococcus aureus (MSSA). Preferred therapy is anti staphylococcal beta lactam antibiotic (Cefazolin or Nafcillin), unless clinically contraindicated. CRITICAL RESULT CALLED TO, READ BACK BY AND VERIFIED WITH: L. Seay Pharm.D. 10:45 01/09/17 ([email or web address])    Methicillin resistance NOT DETECTED NOT DETECTED Final   Streptococcus species NOT DETECTED NOT DETECTED Final   Streptococcus agalactiae NOT DETECTED NOT DETECTED Final   Streptococcus pneumoniae NOT DETECTED NOT DETECTED Final   Streptococcus pyogenes NOT DETECTED NOT DETECTED Final   Acinetobacter baumannii NOT DETECTED NOT DETECTED Final   Enterobacteriaceae species NOT DETECTED NOT  DETECTED Final   Enterobacter cloacae complex NOT DETECTED NOT DETECTED Final   Escherichia coli NOT DETECTED NOT DETECTED Final   Klebsiella oxytoca NOT DETECTED NOT DETECTED Final   Klebsiella pneumoniae NOT DETECTED NOT DETECTED Final   Proteus species NOT DETECTED NOT DETECTED Final   Serratia marcescens NOT DETECTED NOT DETECTED Final   Haemophilus influenzae NOT DETECTED NOT DETECTED Final   Neisseria meningitidis NOT DETECTED NOT DETECTED Final   Pseudomonas aeruginosa NOT DETECTED NOT DETECTED Final   Candida albicans NOT DETECTED NOT  DETECTED Final   Candida glabrata NOT DETECTED NOT DETECTED Final   Candida krusei NOT DETECTED NOT DETECTED Final   Candida parapsilosis NOT DETECTED NOT DETECTED Final   Candida tropicalis NOT DETECTED NOT DETECTED Final    Comment: Performed at [facility], [place] [address] [place], [place] [address]  Culture, blood (Routine X 2) w Reflex to ID Panel     Status: Abnormal   Collection Time: 01/08/17  3:29 PM  Result Value Ref Range Status   Specimen Description BLOOD RIGHT HAND  Final   Special Requests   Final    BOTTLES DRAWN AEROBIC ONLY Blood Culture results may not be optimal due to an inadequate volume of blood received in culture bottles   Culture  Setup Time   Final    GRAM POSITIVE COCCI IN CLUSTERS Gram Stain Report Called to,Read Back By and Verified With: [facility] @ 1427 ON 4.18.18 BY BAYSE,L AEROBIC BOTTLE ONLY CRITICAL VALUE NOTED.  VALUE IS CONSISTENT WITH PREVIOUSLY REPORTED AND CALLED VALUE.    Culture (A)  Final    STAPHYLOCOCCUS AUREUS SUSCEPTIBILITIES PERFORMED ON PREVIOUS CULTURE WITHIN THE LAST 5 DAYS. Performed at [facility], [place] [address], [place], [place] [address]    Report Status 01/11/2017 FINAL  Final  Culture, blood (routine x 2)     Status: None (Preliminary result)   Collection Time: 01/10/17  3:53 PM  Result Value Ref Range Status   Specimen Description BLOOD RIGHT FOREARM  Final   Special  Requests   Final    BOTTLES DRAWN AEROBIC AND ANAEROBIC Blood Culture adequate volume   Culture NO GROWTH 4 DAYS  Final   Report Status PENDING  Incomplete  Culture, blood (routine x 2)     Status: None (Preliminary result)   Collection Time: 01/10/17  3:59 PM  Result Value Ref Range Status   Specimen Description BLOOD LEFT HAND  Final   Special Requests   Final    BOTTLES DRAWN AEROBIC AND ANAEROBIC Blood Culture adequate volume   Culture NO GROWTH 4 DAYS  Final   Report Status PENDING  Incomplete  Surgical pcr screen     Status: Abnormal   Collection Time: 01/11/17 12:46 AM  Result Value Ref Range Status   MRSA, PCR NEGATIVE NEGATIVE Final   Staphylococcus aureus POSITIVE (A) NEGATIVE Final    Comment:        The Xpert SA Assay (FDA approved for NASAL specimens in patients over 1 years of age), is one component of a comprehensive surveillance program.  Test performance has been validated by [facility] [place] for patients greater than or equal to 34 year old. It is not intended to diagnose infection nor to guide or monitor treatment. RESULT CALLED TO, READ BACK BY AND VERIFIED WITH: [facility] AT 0557 BY HUFFINES,S ON 01/11/17.   Cath Tip Culture     Status: Abnormal   Collection Time: 01/11/17  2:33 PM  Result Value Ref Range Status   Specimen Description PORTA CATH  Final   Special Requests   Final    CATH TIP SENT IN SWAB Performed at [facility], [place] [address] [place], [place] [address]    Culture <10,000 COLONIES/mL STAPHYLOCOCCUS AUREUS (A)  Final   Report Status 01/14/2017 FINAL  Final         Radiology Studies: No results found.      Scheduled Meds: . Chlorhexidine Gluconate Cloth  6 each Topical Daily  . docusate sodium  100 mg Oral BID  . feeding supplement (PRO-STAT SUGAR FREE 64)  30 mL Oral BID  . folic acid  1 mg Oral Daily  . furosemide  40 mg Intravenous BID  . gabapentin  100 mg Oral TID  . HYDROmorphone   Intravenous Q4H  .  L-glutamine  5 g Oral BID  . loratadine  10 mg Oral Daily  . morphine  15 mg Oral Q12H  . mupirocin ointment  1 application Nasal BID  . potassium chloride  10 mEq Oral Daily   Continuous Infusions: . sodium chloride 250 mL (01/09/17 1543)  . sodium chloride 10 mL/hr at 01/09/17 1543  . albumin human    . ceFAZolin (ANCEF) IVPB (doses >1 g) Stopped (01/14/17 1646)     LOS: 12 days    Time spent: 58mins    Shanyce Daris, MD [facility] Pager [phone]  If 7PM-7AM, please contact night-coverage [email or web address] Password [facility] 01/14/2017, 6:54 PM

## 2017-01-14 NOTE — Progress Notes (Signed)
CRITICAL VALUE ALERT  Critical value received:  Hemoglobin 6.2  Date of notification:  01/14/17  Time of notification:  07:20  Critical value read back: yes  Nurse who received alert:  Dorothyann Peng  MD notified (1st page):  Dr. Roderic Palau  Time of first page:  0728am  MD notified (2nd page):  Time of second page:  Responding MD:    Time MD responded:

## 2017-01-14 NOTE — Progress Notes (Signed)
Initial Nutrition Assessment   INTERVENTION:  ProStat 30 ml TID (each 30 ml provides 100 kcal, 15 gr protein)   Add MVI   Recommend High Calorie/High Protein diet  Nursing to offer snack between meals from nourishment room.  Recommend obtain a current wt to assess change since admission 4/11.  NUTRITION DIAGNOSIS:   Increased nutrient needs related to chronic illness (sickle cell disease) as evidenced by estimated needs (albumin 1.5 , moderate fluid accumulation to BLE).   GOAL:   Patient will meet greater than or equal to 90% of their needs   MONITOR:   PO intake, Supplement acceptance, Labs, Weight trends  REASON FOR ASSESSMENT:   Consult Assessment of nutrition requirement/status  ASSESSMENT: the patient is a 36 yo female with sickle cell disease. Her hx also includes B12 deficiency, demand ischemia and DM.   Her appetite is good. Home diet is reported a regular. She is eating well despite her prolonged hospitalization. 75-100% of most meals. She has increased needs related to her sickle cell disease. We talked about her protein status. She affirms eating her protein foods but everv at 100% the average protein provided daily is ~90 gr. She is agreeable to [place] adding a protein modular in order to meet her daily goal.  Her weight hx is stable currently at 75-76 kg. A new weight is pending to review for possible changes since admission 4/11.    the nutrition services staff are obtaining her food preferences daily.   Recent Labs Lab 01/12/17 0641 01/13/17 0610 01/14/17 0507  NA 134* 136 139  K 3.5 4.0 4.1  CL 105 106 110  CO2 23 24 23   BUN 20 28* 28*  CREATININE 0.92 1.40* 0.97  CALCIUM 7.5* 7.6* 7.8*  GLUCOSE 92 96 103*   Labs/Meds- Reviewed. She is taking folic acid and potassium  Nutrition-Focused physical exam: WDL -- Except BLE edema.  Diet Order:  Diet regular Room service appropriate? Yes; Fluid consistency: Thin  Skin:  Reviewed, no issues  Last BM:   4/23  Height:   Ht Readings from Last 1 Encounters:  01/02/17 5\' 3"  (1.6 m)    Weight:   Wt Readings from Last 1 Encounters:  01/02/17 168 lb (76.2 kg)    Ideal Body Weight:  52 kg  BMI:  Body mass index is 29.76 kg/m.  Estimated Nutritional Needs:   Kcal:  1800-1900 kcal ([place] AF-1.3)  Protein:  98-114 gr (1.3-1.5 gr/kg)  Fluid:  1.8-1.9 liters daily  EDUCATION NEEDS:   Education needs addressed (related to increased protein needs )  Colman Cater MS,RD,CSG,LDN Office: [phone] Pager: [phone]

## 2017-01-15 ENCOUNTER — Encounter (HOSPITAL_COMMUNITY): Payer: Self-pay | Admitting: General Surgery

## 2017-01-15 LAB — CULTURE, BLOOD (ROUTINE X 2)
CULTURE: NO GROWTH
Culture: NO GROWTH
Special Requests: ADEQUATE
Special Requests: ADEQUATE

## 2017-01-15 LAB — BASIC METABOLIC PANEL
Anion gap: 3 — ABNORMAL LOW (ref 5–15)
BUN: 20 mg/dL (ref 6–20)
CALCIUM: 7.9 mg/dL — AB (ref 8.9–10.3)
CO2: 26 mmol/L (ref 22–32)
CREATININE: 0.61 mg/dL (ref 0.44–1.00)
Chloride: 110 mmol/L (ref 101–111)
GFR calc Af Amer: >60 mL/min (ref >60)
GFR calc non Af Amer: >60 mL/min (ref >60)
Glucose, Bld: 90 mg/dL (ref 65–99)
Potassium: 3.6 mmol/L (ref 3.5–5.1)
SODIUM: 139 mmol/L (ref 135–145)

## 2017-01-15 LAB — CBC
HEMATOCRIT: 15.3 % — AB (ref 36.0–46.0)
HEMOGLOBIN: 5.3 g/dL — AB (ref 12.0–15.0)
MCH: 32.1 pg (ref 26.0–34.0)
MCHC: 34.6 g/dL (ref 30.0–36.0)
MCV: 92.7 fL (ref 78.0–100.0)
Platelets: 260 10*3/uL (ref 150–400)
RBC: 1.65 MIL/uL — AB (ref 3.87–5.11)
RDW: 20.7 % — AB (ref 11.5–15.5)
WBC: 17.9 10*3/uL — AB (ref 4.0–10.5)

## 2017-01-15 LAB — PREPARE RBC (CROSSMATCH)

## 2017-01-15 MED ORDER — SODIUM CHLORIDE 0.9 % IV SOLN
Freq: Once | INTRAVENOUS | Status: AC
  Administered 2017-01-15: 16:00:00 via INTRAVENOUS

## 2017-01-15 NOTE — Progress Notes (Signed)
CRITICAL VALUE ALERT  Critical value received:  HGB 5.3  Date of notification:   01/15/2017  Time of notification:  0610  Critical value read back: yes  Nurse who received alert:   Fran Mcree c  MD notified (1st page):   Dr. Olevia Bowens  Time of first page:   0630  MD notified (2nd page):  Time of second page:  Responding MD:  Dr. Olevia Bowens  Time MD responded:  [phone]

## 2017-01-15 NOTE — Psychosocial Assessment (Signed)
Per Dr. Olevia Bowens. Continue to monitor pt. HGB 5.3 but asymptomatic.

## 2017-01-15 NOTE — Care Management Note (Signed)
Case Management Note  Patient Details  Name: Dawn [name] MRN: [id number] Date of Birth: 11-30-1980     If discussed at [facility] Length of Stay Meetings, dates discussed:  01/15/2017  Additional Comments: Patient requesting transfer to [facility]. No beds available currently. Will cont to follow.   Raylei Losurdo, Chauncey Reading, RN 01/15/2017, 10:19 AM

## 2017-01-15 NOTE — Progress Notes (Signed)
PROGRESS NOTE    Dawn [name]  FEO:[id number] DOB: Jun 01, 1981 DOA: 01/02/2017 PCP: [name], FNP    Brief Narrative:  36 year old female with history of sickle cell anemia, presented with sickle cell crisis. She's had 13 admissions in the past 6 months and 27 ED visits in the last 6 months. She was admitted for sickle cell anemia pain crisis. She is currently on a Dilaudid PCA. She's been transfused 2 units of PRBCs for worsening anemia. Hospital course complicated by development of fever and evidence of staph aureus bacteremia. Port-A-Cath that was placed earlier this month has been removed. Further workup including TEE did not show any vegetations. Plan is to place PICC line for long-term antibiotics. Other barriers to discharge his pain management since she is still requiring PCA. We'll try and wean off as her condition improves..   Assessment & Plan:   Principal Problem:   Sickle cell crisis (HCC) Active Problems:   Sickle cell disease (HCC)   Elevated LFTs   Hyperbilirubinemia   Thrombocytopenia (HCC)   Chronic pain syndrome   Anemia of chronic disease   Bacteremia due to Staphylococcus aureus  1. Sickle cell pain crisis. Continues to have uncontrolled pain, requiring PCA. Continue current treatments. She is continued on her long-acting narcotics. Wean off PCA as her pain improves  2. Sickle cell anemia. Hemoglobin 6.7 today. She did receive 2 units of PRBCs and 4/5 with improvement of hemoglobin. She received another 2 units on 4/19 in preparation for surgery. Hemoglobin down to 5.3 today. She reports feeling increasingly weak and tired. There is no obvious bleeding. This is likely related to her sickle crisis. Will transfuse another 2 units of PRBC. Continue to monitor.  3. Staph aureus bacteremia. Hospital course has been complicated by development of fever. Chest x-ray did not show any significant findings. Blood cultures returned positive for MSSA bacteremia. Discussed  with infectious disease who recommended removal of Port-A-Cath which was completed on 4/20. Catheter tip culture also positive for staph aureus. Repeat blood cultures drawn on 4/19 have shown no growth. TEE did not show any evidence of vegetations and MRI L spine did not show infection/discitis. Since this is a recurrent bacteremia (last episode was in 10/2016), she will need 4 weeks of IV antibiotics to be completed on 5/17. Once antibiotics are complete, she should have repeat blood cultures drawn on 5/22 2 ensure that bacteremia has cleared, prior to any further port placement.   4. Thrombocytopenia. Possibly related to crisis. Improved   5. Chronic hyperbilirubinemia. Likely secondary to hemolysis. No abdominal pain, nausea or vomiting. Continue to monitor.  6. Lower extremity edema/anasarca, likely related to aggressive hydration and hypoalbuminemia. Fluids have been discontinued. She is currently on IV Lasix with good urine output. Continue current treatments..  7. AKI. Possibly related to toradol and lasix. toradol has been discontinued. Renal function has since improved. Creatinine currently stable  8. Hypoalbuminemia. No proteinuria on urinalysis. Likely related to poor po intake. Nutrition consult   DVT prophylaxis: SCDs Code Status: Full code Family Communication: No family at bedside Disposition Plan: On 4/23, patient requested possible transfer to [facility]. I discussed her case with Dr. Ermalinda Barrios, a hospitalist at [facility]. At that time, they did not have any available beds. After discussing further with patient, she would like to continue her care at [facility] for now. Anticipate discharge home in the next few days.    Consultants:   Cardiology for TEE   general surgery for  Port-A-Cath removal  Infectious disease (phone consult)  Procedures:  2D echo: - Left ventricle: The cavity size was normal. Wall thickness was   normal. Systolic function was normal.  The estimated ejection   fraction was in the range of 60% to 65%. Wall motion was normal;   there were no regional wall motion abnormalities. The study is   not technically sufficient to allow evaluation of LV diastolic   function. - Aortic valve: Valve area (VTI): 2.19 cm^2. Valve area (Vmax):   2.23 cm^2. Valve area (Vmean): 2.14 cm^2. - Left atrium: The atrium was moderately dilated. - Right atrium: The atrium was mildly dilated. - Pulmonary arteries: Systolic pressure was moderately increased.   PA peak pressure: 37 mm Hg (S). - Pericardium, extracardiac: There is a small circumferential   pericardial effusion.  - Technically adequate study.   TEE: No evidence of endocarditis. There was no evidence of a    vegetation.    Removal of portacath by Dr. Arnoldo Morale on 4/20  Placement of femoral central line by Dr. Arnoldo Morale on 4/20 .  Antimicrobials:   Ancef 4/18>> 5/17   Subjective: She is encouraged with her increased urine output and improving swelling. Continues to have bony pain.  Objective: Vitals:   01/15/17 0813 01/15/17 0900 01/15/17 1300 01/15/17 1541  BP:  133/69 123/61   Pulse:  (!) 102 (!) 106   Resp: 20 18 20 12   Temp:  99 F (37.2 C) 98.7 F (37.1 C)   TempSrc:  Oral Oral   SpO2: 98% 99% 98% 100%  Weight:      Height:        Intake/Output Summary (Last 24 hours) at 01/15/17 1611 Last data filed at 01/14/17 2117  Gross per 24 hour  Intake              120 ml  Output             1550 ml  Net            -1430 ml   Filed Weights   01/02/17 1533 01/02/17 2110 01/14/17 1840  Weight: 76.2 kg (168 lb) 76.2 kg (168 lb) 88.9 kg (196 lb)    Examination: General exam: Alert, awake, oriented x 3 Respiratory system: Clear to auscultation. Respiratory effort normal. Cardiovascular system:RRR. No murmurs, rubs, gallops. Gastrointestinal system: Abdomen is nondistended, soft and nontender. No organomegaly or masses felt. Normal bowel sounds heard. Central  nervous system: Alert and oriented. No focal neurological deficits. Extremities: 1-2+ anasarca Skin: No rashes, lesions or ulcers Psychiatry: Judgement and insight appear normal. Mood & affect appropriate.        Data Reviewed: I have personally reviewed following labs and imaging studies  CBC:  Recent Labs Lab 01/11/17 0619 01/12/17 0641 01/13/17 0610 01/14/17 0507 01/15/17 0445  WBC 14.2* 11.2* 14.5* 16.8* 17.9*  HGB 7.9* 6.7* 6.6* 6.2* 5.3*  HCT 22.2* 18.8* 17.3* 17.5* 15.3*  MCV 93.7 92.6 90.1 91.6 92.7  PLT 88* 127* 136* 189 127   Basic Metabolic Panel:  Recent Labs Lab 01/10/17 0528 01/12/17 0641 01/13/17 0610 01/14/17 0507 01/15/17 0445  NA 136 134* 136 139 139  K 3.2* 3.5 4.0 4.1 3.6  CL 102 105 106 110 110  CO2 28 23 24 23 26   GLUCOSE 116* 92 96 103* 90  BUN 11 20 28* 28* 20  CREATININE 0.65 0.92 1.40* 0.97 0.61  CALCIUM 7.6* 7.5* 7.6* 7.8* 7.9*   GFR: Estimated Creatinine Clearance: 102.8 mL/min (  by C-G formula based on SCr of 0.61 mg/dL). Liver Function Tests:  Recent Labs Lab 01/13/17 0610  AST 101*  ALT 11*  ALKPHOS 121  BILITOT 16.7*  PROT 5.3*  ALBUMIN 1.5*   No results for input(s): LIPASE, AMYLASE in the last 168 hours. No results for input(s): AMMONIA in the last 168 hours. Coagulation Profile: No results for input(s): INR, PROTIME in the last 168 hours. Cardiac Enzymes: No results for input(s): CKTOTAL, CKMB, CKMBINDEX, TROPONINI in the last 168 hours. BNP (last 3 results) No results for input(s): PROBNP in the last 8760 hours. HbA1C: No results for input(s): HGBA1C in the last 72 hours. CBG: No results for input(s): GLUCAP in the last 168 hours. Lipid Profile: No results for input(s): CHOL, HDL, LDLCALC, TRIG, CHOLHDL, LDLDIRECT in the last 72 hours. Thyroid Function Tests: No results for input(s): TSH, T4TOTAL, FREET4, T3FREE, THYROIDAB in the last 72 hours. Anemia Panel: No results for input(s): VITAMINB12, FOLATE,  FERRITIN, TIBC, IRON, RETICCTPCT in the last 72 hours. Sepsis Labs: No results for input(s): PROCALCITON, LATICACIDVEN in the last 168 hours.  Recent Results (from the past 240 hour(s))  Culture, blood (Routine X 2) w Reflex to ID Panel     Status: Abnormal   Collection Time: 01/08/17  3:14 PM  Result Value Ref Range Status   Specimen Description PORTA CATH  Final   Special Requests   Final    BOTTLES DRAWN AEROBIC ONLY Blood Culture adequate volume   Culture  Setup Time   Final    GRAM POSITIVE COCCI IN CLUSTERS Gram Stain Report Called to,Read Back By and Verified With: TETREAULT,H AT 0700 BY HUFFINES,S ON 01/09/17.    Culture STAPHYLOCOCCUS AUREUS (A)  Final   Report Status 01/11/2017 FINAL  Final   Organism ID, Bacteria STAPHYLOCOCCUS AUREUS  Final      Susceptibility   Staphylococcus aureus - MIC*    CIPROFLOXACIN <=0.5 SENSITIVE Sensitive     ERYTHROMYCIN <=0.25 SENSITIVE Sensitive     GENTAMICIN <=0.5 SENSITIVE Sensitive     OXACILLIN <=0.25 SENSITIVE Sensitive     TETRACYCLINE <=1 SENSITIVE Sensitive     VANCOMYCIN <=0.5 SENSITIVE Sensitive     TRIMETH/SULFA <=10 SENSITIVE Sensitive     CLINDAMYCIN <=0.25 SENSITIVE Sensitive     RIFAMPIN <=0.5 SENSITIVE Sensitive     Inducible Clindamycin NEGATIVE Sensitive     * STAPHYLOCOCCUS AUREUS  Blood Culture ID Panel (Reflexed)     Status: Abnormal   Collection Time: 01/08/17  3:14 PM  Result Value Ref Range Status   Enterococcus species NOT DETECTED NOT DETECTED Final   Listeria monocytogenes NOT DETECTED NOT DETECTED Final   Staphylococcus species DETECTED (A) NOT DETECTED Final    Comment: CRITICAL RESULT CALLED TO, READ BACK BY AND VERIFIED WITH: L. Seay Pharm.D. 10:45 01/09/17 ([email or web address])    Staphylococcus aureus DETECTED (A) NOT DETECTED Final    Comment: Methicillin (oxacillin) susceptible Staphylococcus aureus (MSSA). Preferred therapy is anti staphylococcal beta lactam antibiotic (Cefazolin or Nafcillin), unless  clinically contraindicated. CRITICAL RESULT CALLED TO, READ BACK BY AND VERIFIED WITH: L. Seay Pharm.D. 10:45 01/09/17 ([email or web address])    Methicillin resistance NOT DETECTED NOT DETECTED Final   Streptococcus species NOT DETECTED NOT DETECTED Final   Streptococcus agalactiae NOT DETECTED NOT DETECTED Final   Streptococcus pneumoniae NOT DETECTED NOT DETECTED Final   Streptococcus pyogenes NOT DETECTED NOT DETECTED Final   Acinetobacter baumannii NOT DETECTED NOT DETECTED Final   Enterobacteriaceae species NOT DETECTED NOT DETECTED Final  Enterobacter cloacae complex NOT DETECTED NOT DETECTED Final   Escherichia coli NOT DETECTED NOT DETECTED Final   Klebsiella oxytoca NOT DETECTED NOT DETECTED Final   Klebsiella pneumoniae NOT DETECTED NOT DETECTED Final   Proteus species NOT DETECTED NOT DETECTED Final   Serratia marcescens NOT DETECTED NOT DETECTED Final   Haemophilus influenzae NOT DETECTED NOT DETECTED Final   Neisseria meningitidis NOT DETECTED NOT DETECTED Final   Pseudomonas aeruginosa NOT DETECTED NOT DETECTED Final   Candida albicans NOT DETECTED NOT DETECTED Final   Candida glabrata NOT DETECTED NOT DETECTED Final   Candida krusei NOT DETECTED NOT DETECTED Final   Candida parapsilosis NOT DETECTED NOT DETECTED Final   Candida tropicalis NOT DETECTED NOT DETECTED Final    Comment: Performed at [facility], [place] [address] [place], [place] [address]  Culture, blood (Routine X 2) w Reflex to ID Panel     Status: Abnormal   Collection Time: 01/08/17  3:29 PM  Result Value Ref Range Status   Specimen Description BLOOD RIGHT HAND  Final   Special Requests   Final    BOTTLES DRAWN AEROBIC ONLY Blood Culture results may not be optimal due to an inadequate volume of blood received in culture bottles   Culture  Setup Time   Final    GRAM POSITIVE COCCI IN CLUSTERS Gram Stain Report Called to,Read Back By and Verified With: [facility] @ 1427 ON 4.18.18 BY BAYSE,L AEROBIC BOTTLE  ONLY CRITICAL VALUE NOTED.  VALUE IS CONSISTENT WITH PREVIOUSLY REPORTED AND CALLED VALUE.    Culture (A)  Final    STAPHYLOCOCCUS AUREUS SUSCEPTIBILITIES PERFORMED ON PREVIOUS CULTURE WITHIN THE LAST 5 DAYS. Performed at [facility], [place] [address], [place], [place] [address]    Report Status 01/11/2017 FINAL  Final  Culture, blood (routine x 2)     Status: None   Collection Time: 01/10/17  3:53 PM  Result Value Ref Range Status   Specimen Description BLOOD RIGHT FOREARM  Final   Special Requests   Final    BOTTLES DRAWN AEROBIC AND ANAEROBIC Blood Culture adequate volume   Culture NO GROWTH 5 DAYS  Final   Report Status 01/15/2017 FINAL  Final  Culture, blood (routine x 2)     Status: None   Collection Time: 01/10/17  3:59 PM  Result Value Ref Range Status   Specimen Description BLOOD LEFT HAND  Final   Special Requests   Final    BOTTLES DRAWN AEROBIC AND ANAEROBIC Blood Culture adequate volume   Culture NO GROWTH 5 DAYS  Final   Report Status 01/15/2017 FINAL  Final  Surgical pcr screen     Status: Abnormal   Collection Time: 01/11/17 12:46 AM  Result Value Ref Range Status   MRSA, PCR NEGATIVE NEGATIVE Final   Staphylococcus aureus POSITIVE (A) NEGATIVE Final    Comment:        The Xpert SA Assay (FDA approved for NASAL specimens in patients over 38 years of age), is one component of a comprehensive surveillance program.  Test performance has been validated by [facility] for patients greater than or equal to 77 year old. It is not intended to diagnose infection nor to guide or monitor treatment. RESULT CALLED TO, READ BACK BY AND VERIFIED WITH: [facility] AT 0557 BY HUFFINES,S ON 01/11/17.   Cath Tip Culture     Status: Abnormal   Collection Time: 01/11/17  2:33 PM  Result Value Ref Range Status   Specimen  Description PORTA CATH  Final   Special Requests   Final    CATH TIP SENT IN SWAB Performed at [facility], [place] [address], [place],  [place] [address]    Culture <10,000 COLONIES/mL STAPHYLOCOCCUS AUREUS (A)  Final   Report Status 01/14/2017 FINAL  Final         Radiology Studies: No results found.      Scheduled Meds: . Chlorhexidine Gluconate Cloth  6 each Topical Daily  . docusate sodium  100 mg Oral BID  . feeding supplement (PRO-STAT SUGAR FREE 64)  30 mL Oral BID  . folic acid  1 mg Oral Daily  . furosemide  40 mg Intravenous BID  . gabapentin  100 mg Oral TID  . HYDROmorphone   Intravenous Q4H  . L-glutamine  5 g Oral BID  . loratadine  10 mg Oral Daily  . morphine  15 mg Oral Q12H  . mupirocin ointment  1 application Nasal BID  . potassium chloride  10 mEq Oral Daily   Continuous Infusions: . sodium chloride 250 mL (01/09/17 1543)  . sodium chloride 10 mL/hr at 01/09/17 1543  . albumin human    . ceFAZolin (ANCEF) IVPB (doses >1 g) Stopped (01/15/17 1446)     LOS: 13 days    Time spent: 6mins    Rockey Guarino, MD [facility] Pager [phone]  If 7PM-7AM, please contact night-coverage [email or web address] Password [email or web address] 01/15/2017, 4:11 PM

## 2017-01-16 DIAGNOSIS — R7989 Other specified abnormal findings of blood chemistry: Secondary | ICD-10-CM

## 2017-01-16 DIAGNOSIS — R601 Generalized edema: Secondary | ICD-10-CM

## 2017-01-16 DIAGNOSIS — R0602 Shortness of breath: Secondary | ICD-10-CM

## 2017-01-16 LAB — COMPREHENSIVE METABOLIC PANEL
ALBUMIN: 2.1 g/dL — AB (ref 3.5–5.0)
ALK PHOS: 125 U/L (ref 38–126)
ALT: 8 U/L — AB (ref 14–54)
AST: 104 U/L — AB (ref 15–41)
Anion gap: 6 (ref 5–15)
BUN: 11 mg/dL (ref 6–20)
CALCIUM: 7.9 mg/dL — AB (ref 8.9–10.3)
CHLORIDE: 105 mmol/L (ref 101–111)
CO2: 27 mmol/L (ref 22–32)
CREATININE: 0.47 mg/dL (ref 0.44–1.00)
GFR calc non Af Amer: >60 mL/min (ref >60)
GLUCOSE: 94 mg/dL (ref 65–99)
Potassium: 3 mmol/L — ABNORMAL LOW (ref 3.5–5.1)
SODIUM: 138 mmol/L (ref 135–145)
Total Bilirubin: 13.7 mg/dL — ABNORMAL HIGH (ref 0.3–1.2)
Total Protein: 5.8 g/dL — ABNORMAL LOW (ref 6.5–8.1)

## 2017-01-16 LAB — CBC
HCT: 15.4 % — ABNORMAL LOW (ref 36.0–46.0)
Hemoglobin: 5.4 g/dL — CL (ref 12.0–15.0)
MCH: 33.1 pg (ref 26.0–34.0)
MCHC: 35.1 g/dL (ref 30.0–36.0)
MCV: 94.5 fL (ref 78.0–100.0)
PLATELETS: 351 10*3/uL (ref 150–400)
RBC: 1.63 MIL/uL — AB (ref 3.87–5.11)
RDW: 20.9 % — ABNORMAL HIGH (ref 11.5–15.5)
WBC: 15.4 10*3/uL — ABNORMAL HIGH (ref 4.0–10.5)

## 2017-01-16 LAB — RETICULOCYTES
RBC.: 1.72 MIL/uL — ABNORMAL LOW (ref 3.87–5.11)
RETIC COUNT ABSOLUTE: 344 10*3/uL — AB (ref 19.0–186.0)
RETIC CT PCT: 20 % — AB (ref 0.4–3.1)

## 2017-01-16 LAB — MAGNESIUM: MAGNESIUM: 1.3 mg/dL — AB (ref 1.7–2.4)

## 2017-01-16 LAB — LACTATE DEHYDROGENASE: LDH: 291 U/L — AB (ref 98–192)

## 2017-01-16 MED ORDER — MAGNESIUM SULFATE 2 GM/50ML IV SOLN
2.0000 g | Freq: Once | INTRAVENOUS | Status: AC
  Administered 2017-01-16: 2 g via INTRAVENOUS
  Filled 2017-01-16: qty 50

## 2017-01-16 MED ORDER — MORPHINE SULFATE ER 30 MG PO TBCR
30.0000 mg | EXTENDED_RELEASE_TABLET | Freq: Two times a day (BID) | ORAL | Status: DC
  Administered 2017-01-16 – 2017-01-25 (×18): 30 mg via ORAL
  Filled 2017-01-16 (×18): qty 1

## 2017-01-16 MED ORDER — HYDROMORPHONE HCL 1 MG/ML IJ SOLN: 1.0000 mg | INTRAMUSCULAR | Status: DC | PRN

## 2017-01-16 MED ORDER — HYDROXYUREA 500 MG PO CAPS
1000.0000 mg | ORAL_CAPSULE | Freq: Every day | ORAL | Status: DC
  Administered 2017-01-16 – 2017-01-25 (×10): 1000 mg via ORAL
  Filled 2017-01-16 (×10): qty 2

## 2017-01-16 MED ORDER — POTASSIUM CHLORIDE CRYS ER 20 MEQ PO TBCR
40.0000 meq | EXTENDED_RELEASE_TABLET | Freq: Two times a day (BID) | ORAL | Status: DC
  Administered 2017-01-16 (×2): 40 meq via ORAL
  Filled 2017-01-16 (×3): qty 2

## 2017-01-16 NOTE — Progress Notes (Signed)
PROGRESS NOTE    Dawn [name]  MEQ:[id number] DOB: Feb 19, 1981 DOA: 01/02/2017 PCP: [name], FNP   Brief Narrative:  36 year old female with history of sickle cell anemia, presented with sickle cell crisis. She's had 13 admissions in the past 6 months and 27 ED visits in the last 6 months. She was admitted for sickle cell anemia pain crisis. She is currently on a Dilaudid PCA and was transfused 4 units of PRBCs for worsening anemia. Hospital course complicated by development of fever and evidence of staph aureus bacteremia. Port-A-Cath that was placed earlier this month has been removed. Further workup including TEE did not show any vegetations. PICC line was placed for long-term antibiotics. Other barriers to discharge his pain management since she is still requiring PCA. We'll try and wean off as her condition improves. Discussed case with Sickle Cell Physician Dr. Liston Alba who recommended not transfusing patient and starting back Hydroxyurea.   Assessment & Plan:   Principal Problem:   Sickle cell crisis (HCC) Active Problems:   Sickle cell disease (HCC)   Elevated LFTs   Hyperbilirubinemia   Thrombocytopenia (HCC)   Chronic pain syndrome   Anemia of chronic disease   Bacteremia due to Staphylococcus aureus  1. Acute Sickle Cell Pain Crisis. -Continues to have uncontrolled pain, requiring Dilaudid PCA. Will attempt to Wean down Dilaudid PCA -She is continued on her long-acting narcotics with Oxycodone 10 mg po q4hprn for Moderate Pain -Increased MS Contin 15 mg po q12h -> 30 mg po q12h.  -Wean off PCA as her pain improves -Discussed Case with Dr. Liston Alba who recommended starting back on 1000 mg of Hydroxyurea and recommended against transfusion of pRBCs based on Reticulocyte % count being 20.0  -Continue to Ambulate  2. Sickle Cell Anemia.  -She did receive 2 units of PRBCs and 4/5 with improvement of hemoglobin. She received another 2 units on 4/19 in  preparation for surgery for Removal of Port-A-Cath.  -Hb/Hct down to 5.4/15.4 today.  -LDH was 291; Reticulocyte Count Percentage was 20.0 and Retic Count Manually was 344 -She reports feeling increasingly weak and tired. There is no obvious bleeding.  -This is likely related to her sickle crisis.  -Was being transfused another 2 units of PRBC but discontinued at the recommendation of Dr. Liston Alba -Restarted 1000 mg of Hydroxyurea at this time based on Dr. Zigmund Daniel Recommendation to increase HbF -Continue to monitor and repeat CBC in AM along with Reticulocyte Count  3. Staph Aureus Bacteremia.  [facility] course has been complicated by development of fever.  -Chest x-ray did not show any significant findings.  -Blood cultures returned positive for MSSA bacteremia. -Discussed with Infectious Disease who recommended removal of Port-A-Cath which was completed on 4/20.  -Catheter tip culture also positive for staph aureus. -Repeat blood cultures drawn on 4/19 have shown no growth. -TEE did not show any evidence of vegetations and MRI L spine did not show infection/discitis.  -Since this is a recurrent bacteremia (last episode was in 10/2016), she will need 4 weeks of IV antibiotics to be completed on 5/17.  -Once antibiotics are complete, she should have repeat blood cultures drawn on 5/22 to ensure that bacteremia has cleared, prior to any further port placement.   4. Thrombocytopenia. Improved -Possibly related to crisis. Improved as Plts went from 86 ->351 -Repeat CBC in AM  5. Chronic Hyperbilirubinemia. -T Bili went from 16.7 -> 13.7  -Likely secondary to hemolysis from Sickle Cell.  -No abdominal pain, nausea  or vomiting. Continue to monitor. -Repeat CMP in AM  6. Lower Extremity Edema/Anasarca  -Per Record Patient is +13.7 Liters and has gained 28 lbs since admission -Likely related to aggressive hydration and hypoalbuminemia.  -Fluids have been discontinued.  -She is  currently on IV Lasix 40 mg BID with good urine output. -Continue current treatments and monitor Strict I's and O's and Daily Weights  7. AKI. Improved -BUN/Cr went from 28/1.40 (on 01/13/17) -> 11/0.47 -Possibly related to toradol and lasix.  -Toradol has been discontinued. Renal function has since improved. Creatinine currently stable -Repeat CMP in AM  8. Hypoalbuminemia.  -Albumin Level was 2.1 this AM -No proteinuria on urinalysis. Likely related to poor po intake. -Nutrition consult -C/w Feeding Supplement 30 mL po BID  9. Hypokalemia -Patient's K+ this AM was 3.0 -Replete -Repeat CMP in AM  10. Hypomagnesemia -Mag Level was 1.3 -Replete with IV 2 grams of Mag Sulfate x2 -Repeat Mag Level in AM  11. Abnormal LFT's/Elevated AST -AST this Am was 104 -Repeat CMP in AM and continue to Trend  12. Leukocytosis -Likely reactive to pain and Sickle Cell  -WBC went from 17.9 -> 15.4 -On Abx with Cefazolin 2 grams IV q8h -Repeat CBC in AM and continue to Monitor  DVT prophylaxis: SCDs Code Status: FULL CODE Family Communication: No Family present at bedside Disposition Plan:  Consultants:   Cardiology for TEE  General Surgery for Port-A-Cath Removal  Infectious Diseases via Phone Consultation   Dr. Liston Alba of Sickle Cell   Procedures:  2D echo: - Left ventricle: The cavity size was normal. Wall thickness was normal. Systolic function was normal. The estimated ejection fraction was in the range of 60% to 65%. Wall motion was normal; there were no regional wall motion abnormalities. The study is not technically sufficient to allow evaluation of LV diastolic function. - Aortic valve: Valve area (VTI): 2.19 cm^2. Valve area (Vmax): 2.23 cm^2. Valve area (Vmean): 2.14 cm^2. - Left atrium: The atrium was moderately dilated. - Right atrium: The atrium was mildly dilated. - Pulmonary arteries: Systolic pressure was moderately increased. PA  peak pressure: 37 mm Hg (S). - Pericardium, extracardiac: There is a small circumferential pericardial effusion.  - Technically adequate study.   TEE: No evidence of endocarditis. There was no evidence of a  vegetation.    Removal of portacath by Dr. Arnoldo Morale on 4/20  Placement of femoral central line by Dr. Arnoldo Morale on 4/20 .   Antimicrobials:  Anti-infectives    Start     Dose/Rate Route Frequency Ordered Stop   01/09/17 2000  ceFAZolin (ANCEF) IVPB 1 g/50 mL premix - [place]  Status:  Discontinued     2 g 200 mL/hr over 30 Minutes Intravenous Every 8 hours 01/09/17 1156 01/09/17 1157   01/09/17 2000  ceFAZolin (ANCEF) 2 g in dextrose 5 % 100 mL IVPB     2 g 200 mL/hr over 30 Minutes Intravenous Every 8 hours 01/09/17 1157     01/09/17 1800  vancomycin (VANCOCIN) IVPB 750 mg/150 ml premix  Status:  Discontinued     750 mg 150 mL/hr over 60 Minutes Intravenous Every 8 hours 01/09/17 0924 01/09/17 1156   01/09/17 1000  vancomycin (VANCOCIN) 1,500 mg in sodium chloride 0.9 % 500 mL IVPB     1,500 mg 250 mL/hr over 120 Minutes Intravenous  Once 01/09/17 0924 01/09/17 1236   01/03/17 1300  metroNIDAZOLE (FLAGYL) tablet 500 mg  Status:  Discontinued  500 mg Oral 2 times daily 01/03/17 1255 01/12/17 1225     Subjective: Seen and examined at bedside and still swollen but thinks it is getting better. Still felt tired and still in a lot of pain. No nausea or vomiting. No other concerns or complaints at this time.   Objective: Vitals:   01/15/17 2105 01/16/17 0000 01/16/17 0100 01/16/17 0600  BP:   120/79 114/65  Pulse:   100 94  Resp:  18 18 (!) 22  Temp:   98.6 F (37 C) 98.6 F (37 C)  TempSrc:   Oral Oral  SpO2: 99% 98% 100% 100%  Weight:      Height:        Intake/Output Summary (Last 24 hours) at 01/16/17 0713 Last data filed at 01/16/17 0703  Gross per 24 hour  Intake          1640.33 ml  Output             1750 ml  Net          -109.67 ml   Filed  Weights   01/02/17 1533 01/02/17 2110 01/14/17 1840  Weight: 76.2 kg (168 lb) 76.2 kg (168 lb) 88.9 kg (196 lb)   Examination: Physical Exam:  Constitutional: WN/WD obese AAF, NAD and appears calm and comfortable Eyes: Lids and conjunctivae normal,  ENMT: External Ears, Nose appear normal. Grossly normal hearing.  Neck: Appears normal, supple, no cervical masses, normal ROM, no appreciable thyromegaly, no visible JVD Respiratory: Diminished to auscultation bilaterally, no wheezing, rales, rhonchi or crackles. Normal respiratory effort and patient is not tachypenic. No accessory muscle use. Patient wearing supplemental O2 via [place].   Cardiovascular: Slightly tachycardic and loud S1 S2, no appreciable murmurs / rubs / gallops. 2+ Lower extremity pitting edema.  Abdomen: Soft, non-tender, non-distended. No masses palpated. No appreciable hepatosplenomegaly. Bowel sounds positive x4.  GU: Deferred. Musculoskeletal: No clubbing / cyanosis of digits/nails. No joint deformity upper and lower extremities.  Skin: No rashes, lesions, ulcers on limited skin evaluation. No induration; Warm and dry.  Neurologic: CN 2-12 grossly intact with no focal deficits.  Romberg sign cerebellar reflexes not assessed.  Psychiatric: Normal judgment and insight. Alert and oriented x 3. Normal mood and appropriate affect.   Data Reviewed: I have personally reviewed following labs and imaging studies  CBC:  Recent Labs Lab 01/12/17 0641 01/13/17 0610 01/14/17 0507 01/15/17 0445 01/16/17 0522  WBC 11.2* 14.5* 16.8* 17.9* 15.4*  HGB 6.7* 6.6* 6.2* 5.3* 5.4*  HCT 18.8* 17.3* 17.5* 15.3* 15.4*  MCV 92.6 90.1 91.6 92.7 94.5  PLT 127* 136* 189 260 841   Basic Metabolic Panel:  Recent Labs Lab 01/12/17 0641 01/13/17 0610 01/14/17 0507 01/15/17 0445 01/16/17 0522  NA 134* 136 139 139 138  K 3.5 4.0 4.1 3.6 3.0*  CL 105 106 110 110 105  CO2 23 24 23 26 27   GLUCOSE 92 96 103* 90 94  BUN 20 28* 28* 20 11    CREATININE 0.92 1.40* 0.97 0.61 0.47  CALCIUM 7.5* 7.6* 7.8* 7.9* 7.9*   GFR: Estimated Creatinine Clearance: 102.8 mL/min (by C-G formula based on SCr of 0.47 mg/dL). Liver Function Tests:  Recent Labs Lab 01/13/17 0610 01/16/17 0522  AST 101* 104*  ALT 11* 8*  ALKPHOS 121 125  BILITOT 16.7* 13.7*  PROT 5.3* 5.8*  ALBUMIN 1.5* 2.1*   No results for input(s): LIPASE, AMYLASE in the last 168 hours. No results for input(s): AMMONIA in  the last 168 hours. Coagulation Profile: No results for input(s): INR, PROTIME in the last 168 hours. Cardiac Enzymes: No results for input(s): CKTOTAL, CKMB, CKMBINDEX, TROPONINI in the last 168 hours. BNP (last 3 results) No results for input(s): PROBNP in the last 8760 hours. HbA1C: No results for input(s): HGBA1C in the last 72 hours. CBG: No results for input(s): GLUCAP in the last 168 hours. Lipid Profile: No results for input(s): CHOL, HDL, LDLCALC, TRIG, CHOLHDL, LDLDIRECT in the last 72 hours. Thyroid Function Tests: No results for input(s): TSH, T4TOTAL, FREET4, T3FREE, THYROIDAB in the last 72 hours. Anemia Panel: No results for input(s): VITAMINB12, FOLATE, FERRITIN, TIBC, IRON, RETICCTPCT in the last 72 hours. Sepsis Labs: No results for input(s): PROCALCITON, LATICACIDVEN in the last 168 hours.  Recent Results (from the past 240 hour(s))  Culture, blood (Routine X 2) w Reflex to ID Panel     Status: Abnormal   Collection Time: 01/08/17  3:14 PM  Result Value Ref Range Status   Specimen Description PORTA CATH  Final   Special Requests   Final    BOTTLES DRAWN AEROBIC ONLY Blood Culture adequate volume   Culture  Setup Time   Final    GRAM POSITIVE COCCI IN CLUSTERS Gram Stain Report Called to,Read Back By and Verified With: TETREAULT,H AT 0700 BY HUFFINES,S ON 01/09/17.    Culture STAPHYLOCOCCUS AUREUS (A)  Final   Report Status 01/11/2017 FINAL  Final   Organism ID, Bacteria STAPHYLOCOCCUS AUREUS  Final       Susceptibility   Staphylococcus aureus - MIC*    CIPROFLOXACIN <=0.5 SENSITIVE Sensitive     ERYTHROMYCIN <=0.25 SENSITIVE Sensitive     GENTAMICIN <=0.5 SENSITIVE Sensitive     OXACILLIN <=0.25 SENSITIVE Sensitive     TETRACYCLINE <=1 SENSITIVE Sensitive     VANCOMYCIN <=0.5 SENSITIVE Sensitive     TRIMETH/SULFA <=10 SENSITIVE Sensitive     CLINDAMYCIN <=0.25 SENSITIVE Sensitive     RIFAMPIN <=0.5 SENSITIVE Sensitive     Inducible Clindamycin NEGATIVE Sensitive     * STAPHYLOCOCCUS AUREUS  Blood Culture ID Panel (Reflexed)     Status: Abnormal   Collection Time: 01/08/17  3:14 PM  Result Value Ref Range Status   Enterococcus species NOT DETECTED NOT DETECTED Final   Listeria monocytogenes NOT DETECTED NOT DETECTED Final   Staphylococcus species DETECTED (A) NOT DETECTED Final    Comment: CRITICAL RESULT CALLED TO, READ BACK BY AND VERIFIED WITH: L. Seay Pharm.D. 10:45 01/09/17 ([email or web address])    Staphylococcus aureus DETECTED (A) NOT DETECTED Final    Comment: Methicillin (oxacillin) susceptible Staphylococcus aureus (MSSA). Preferred therapy is anti staphylococcal beta lactam antibiotic (Cefazolin or Nafcillin), unless clinically contraindicated. CRITICAL RESULT CALLED TO, READ BACK BY AND VERIFIED WITH: L. Seay Pharm.D. 10:45 01/09/17 ([email or web address])    Methicillin resistance NOT DETECTED NOT DETECTED Final   Streptococcus species NOT DETECTED NOT DETECTED Final   Streptococcus agalactiae NOT DETECTED NOT DETECTED Final   Streptococcus pneumoniae NOT DETECTED NOT DETECTED Final   Streptococcus pyogenes NOT DETECTED NOT DETECTED Final   Acinetobacter baumannii NOT DETECTED NOT DETECTED Final   Enterobacteriaceae species NOT DETECTED NOT DETECTED Final   Enterobacter cloacae complex NOT DETECTED NOT DETECTED Final   Escherichia coli NOT DETECTED NOT DETECTED Final   Klebsiella oxytoca NOT DETECTED NOT DETECTED Final   Klebsiella pneumoniae NOT DETECTED NOT DETECTED Final   Proteus species  NOT DETECTED NOT DETECTED Final   Serratia marcescens NOT DETECTED NOT DETECTED Final  Haemophilus influenzae NOT DETECTED NOT DETECTED Final   Neisseria meningitidis NOT DETECTED NOT DETECTED Final   Pseudomonas aeruginosa NOT DETECTED NOT DETECTED Final   Candida albicans NOT DETECTED NOT DETECTED Final   Candida glabrata NOT DETECTED NOT DETECTED Final   Candida krusei NOT DETECTED NOT DETECTED Final   Candida parapsilosis NOT DETECTED NOT DETECTED Final   Candida tropicalis NOT DETECTED NOT DETECTED Final    Comment: Performed at [facility], [place] [address] [place], [place] [address]  Culture, blood (Routine X 2) w Reflex to ID Panel     Status: Abnormal   Collection Time: 01/08/17  3:29 PM  Result Value Ref Range Status   Specimen Description BLOOD RIGHT HAND  Final   Special Requests   Final    BOTTLES DRAWN AEROBIC ONLY Blood Culture results may not be optimal due to an inadequate volume of blood received in culture bottles   Culture  Setup Time   Final    GRAM POSITIVE COCCI IN CLUSTERS Gram Stain Report Called to,Read Back By and Verified With: [facility] @ 1427 ON 4.18.18 BY [place],L AEROBIC BOTTLE ONLY CRITICAL VALUE NOTED.  VALUE IS CONSISTENT WITH PREVIOUSLY REPORTED AND CALLED VALUE.    Culture (A)  Final    STAPHYLOCOCCUS AUREUS SUSCEPTIBILITIES PERFORMED ON PREVIOUS CULTURE WITHIN THE LAST 5 DAYS. Performed at [facility], [place] [address], [place], [place] [address]    Report Status 01/11/2017 FINAL  Final  Culture, blood (routine x 2)     Status: None   Collection Time: 01/10/17  3:53 PM  Result Value Ref Range Status   Specimen Description BLOOD RIGHT FOREARM  Final   Special Requests   Final    BOTTLES DRAWN AEROBIC AND ANAEROBIC Blood Culture adequate volume   Culture NO GROWTH 5 DAYS  Final   Report Status 01/15/2017 FINAL  Final  Culture, blood (routine x 2)     Status: None   Collection Time: 01/10/17  3:59 PM  Result Value Ref Range Status     Specimen Description BLOOD LEFT HAND  Final   Special Requests   Final    BOTTLES DRAWN AEROBIC AND ANAEROBIC Blood Culture adequate volume   Culture NO GROWTH 5 DAYS  Final   Report Status 01/15/2017 FINAL  Final  Surgical pcr screen     Status: Abnormal   Collection Time: 01/11/17 12:46 AM  Result Value Ref Range Status   MRSA, PCR NEGATIVE NEGATIVE Final   Staphylococcus aureus POSITIVE (A) NEGATIVE Final    Comment:        The Xpert SA Assay (FDA approved for NASAL specimens in patients over 68 years of age), is one component of a comprehensive surveillance program.  Test performance has been validated by [facility] for patients greater than or equal to 36 year old. It is not intended to diagnose infection nor to guide or monitor treatment. RESULT CALLED TO, READ BACK BY AND VERIFIED WITH: [facility] AT 0557 BY HUFFINES,S ON 01/11/17.   Cath Tip Culture     Status: Abnormal   Collection Time: 01/11/17  2:33 PM  Result Value Ref Range Status   Specimen Description PORTA CATH  Final   Special Requests   Final    CATH TIP SENT IN SWAB Performed at [facility], [place] [address], [place], [place] [address]    Culture <10,000 COLONIES/mL STAPHYLOCOCCUS AUREUS (A)  Final   Report Status 01/14/2017 FINAL  Final  Radiology Studies: No results found.   Scheduled Meds: . Chlorhexidine Gluconate Cloth  6 each Topical Daily  . docusate sodium  100 mg Oral BID  . feeding supplement (PRO-STAT SUGAR FREE 64)  30 mL Oral BID  . folic acid  1 mg Oral Daily  . furosemide  40 mg Intravenous BID  . gabapentin  100 mg Oral TID  . HYDROmorphone   Intravenous Q4H  . L-glutamine  5 g Oral BID  . loratadine  10 mg Oral Daily  . morphine  15 mg Oral Q12H  . mupirocin ointment  1 application Nasal BID  . potassium chloride  10 mEq Oral Daily   Continuous Infusions: . sodium chloride 250 mL (01/09/17 1543)  . sodium chloride 10 mL/hr at 01/09/17 1543  . ceFAZolin (ANCEF)  IVPB (doses >1 g) Stopped (01/16/17 0530)    LOS: 14 days   Kerney Elbe, DO [facility] Pager [phone]  If 7PM-7AM, please contact night-coverage [email or web address] Password [facility] 01/16/2017, 7:13 AM

## 2017-01-16 NOTE — Progress Notes (Signed)
Patient ID: Dawn [name], female   DOB: 1980-11-18, 36 y.o.   MRN: [id number] Received call from Dr. Chana Bode at [facility] patients anemia and pain. Reviewed chart and discussed with Dr. Chana Bode. Per Dr. Chana Bode, patient hemodynamically stable. Recommend reticulocytes and LDH to assess kinetics of Anemia. Hold transfusion until labs reviewed and will discuss further when labs received.   Swayze Pries A.

## 2017-01-16 NOTE — Progress Notes (Signed)
Patient blood not available at this time.  Blood should arrive later today per lab.

## 2017-01-17 ENCOUNTER — Ambulatory Visit: Payer: Medicare Other | Admitting: Family Medicine

## 2017-01-17 DIAGNOSIS — E876 Hypokalemia: Secondary | ICD-10-CM

## 2017-01-17 LAB — CBC WITH DIFFERENTIAL/PLATELET
BASOS PCT: 0 %
Basophils Absolute: 0 10*3/uL (ref 0.0–0.1)
EOS ABS: 0.9 10*3/uL — AB (ref 0.0–0.7)
Eosinophils Relative: 7 %
HCT: 17.1 % — ABNORMAL LOW (ref 36.0–46.0)
Hemoglobin: 5.9 g/dL — CL (ref 12.0–15.0)
LYMPHS ABS: 3 10*3/uL (ref 0.7–4.0)
Lymphocytes Relative: 23 %
MCH: 32.8 pg (ref 26.0–34.0)
MCHC: 34.5 g/dL (ref 30.0–36.0)
MCV: 95 fL (ref 78.0–100.0)
MONO ABS: 1.8 10*3/uL — AB (ref 0.1–1.0)
Monocytes Relative: 14 %
NEUTROS ABS: 7.2 10*3/uL (ref 1.7–7.7)
NEUTROS PCT: 56 %
Platelets: 417 10*3/uL — ABNORMAL HIGH (ref 150–400)
RBC: 1.8 MIL/uL — ABNORMAL LOW (ref 3.87–5.11)
RDW: 22.7 % — ABNORMAL HIGH (ref 11.5–15.5)
WBC: 12.9 10*3/uL — ABNORMAL HIGH (ref 4.0–10.5)

## 2017-01-17 LAB — COMPREHENSIVE METABOLIC PANEL
ALBUMIN: 2.1 g/dL — AB (ref 3.5–5.0)
ALT: 10 U/L — ABNORMAL LOW (ref 14–54)
ANION GAP: 7 (ref 5–15)
AST: 109 U/L — AB (ref 15–41)
Alkaline Phosphatase: 133 U/L — ABNORMAL HIGH (ref 38–126)
BILIRUBIN TOTAL: 11.8 mg/dL — AB (ref 0.3–1.2)
BUN: 7 mg/dL (ref 6–20)
CO2: 30 mmol/L (ref 22–32)
Calcium: 7.8 mg/dL — ABNORMAL LOW (ref 8.9–10.3)
Chloride: 100 mmol/L — ABNORMAL LOW (ref 101–111)
Creatinine, Ser: 0.46 mg/dL (ref 0.44–1.00)
GFR calc Af Amer: >60 mL/min (ref >60)
GFR calc non Af Amer: >60 mL/min (ref >60)
GLUCOSE: 89 mg/dL (ref 65–99)
Potassium: 3.1 mmol/L — ABNORMAL LOW (ref 3.5–5.1)
SODIUM: 137 mmol/L (ref 135–145)
TOTAL PROTEIN: 6.1 g/dL — AB (ref 6.5–8.1)

## 2017-01-17 LAB — RETICULOCYTES
RBC.: 1.8 MIL/uL — ABNORMAL LOW (ref 3.87–5.11)
RETIC CT PCT: 22.6 % — AB (ref 0.4–3.1)
Retic Count, Absolute: 406.8 10*3/uL — ABNORMAL HIGH (ref 19.0–186.0)

## 2017-01-17 LAB — MAGNESIUM: Magnesium: 1.8 mg/dL (ref 1.7–2.4)

## 2017-01-17 LAB — PHOSPHORUS: Phosphorus: 3.3 mg/dL (ref 2.5–4.6)

## 2017-01-17 MED ORDER — SENNOSIDES-DOCUSATE SODIUM 8.6-50 MG PO TABS
1.0000 | ORAL_TABLET | Freq: Two times a day (BID) | ORAL | Status: DC
  Administered 2017-01-18 – 2017-01-25 (×7): 1 via ORAL
  Filled 2017-01-17 (×10): qty 1

## 2017-01-17 MED ORDER — MAGNESIUM OXIDE 400 (241.3 MG) MG PO TABS
400.0000 mg | ORAL_TABLET | Freq: Two times a day (BID) | ORAL | Status: DC
  Administered 2017-01-17 – 2017-01-25 (×17): 400 mg via ORAL
  Filled 2017-01-17 (×17): qty 1

## 2017-01-17 MED ORDER — OXYCODONE HCL 5 MG PO TABS
15.0000 mg | ORAL_TABLET | Freq: Four times a day (QID) | ORAL | Status: DC
  Administered 2017-01-17 – 2017-01-18 (×3): 15 mg via ORAL
  Filled 2017-01-17 (×3): qty 3

## 2017-01-17 MED ORDER — POTASSIUM CHLORIDE CRYS ER 20 MEQ PO TBCR
40.0000 meq | EXTENDED_RELEASE_TABLET | Freq: Two times a day (BID) | ORAL | Status: DC
  Administered 2017-01-17 (×2): 40 meq via ORAL
  Filled 2017-01-17: qty 2

## 2017-01-17 MED ORDER — POLYETHYLENE GLYCOL 3350 17 G PO PACK
17.0000 g | PACK | Freq: Every day | ORAL | Status: DC
  Administered 2017-01-19: 17 g via ORAL
  Filled 2017-01-17 (×6): qty 1

## 2017-01-17 NOTE — Progress Notes (Signed)
Patient ambulating halls this morning - tolerating well.

## 2017-01-17 NOTE — Care Management Note (Addendum)
Case Management Note  Patient Details  Name: Dawn [name] MRN: [id number] Date of Birth: 01-Jan-1981    If discussed at [facility] Length of Stay Meetings, dates discussed:   01/17/2017 Additional Comments: CM following for needs. Planning for IV antibiotics at home via PICC after discharge. Patient would like to use [facility] again for IV antibiotics. Romualdo Bolk of [facility] notified. Unknown DC date at this time.   Devun Anna, Chauncey Reading, RN 01/17/2017, 10:20 AM

## 2017-01-17 NOTE — Progress Notes (Signed)
PROGRESS NOTE    Dawn [name]  MVH:[id number] DOB: [birth year] DOA: 01/02/2017 PCP: [name], FNP   Brief Narrative:  36 year old female with history of sickle cell anemia, presented with sickle cell crisis. She's had 13 admissions in the past 6 months and 27 ED visits in the last 6 months. She was admitted for sickle cell anemia pain crisis. She is currently still on a Dilaudid PCA and was transfused 4 units of PRBCs for worsening anemia. Hospital course complicated by development of fever and evidence of staph aureus bacteremia. Port-A-Cath that was placed earlier this month has been removed. Further workup including TEE did not show any vegetations. PICC line was placed for long-term antibiotics. Other barriers to discharge his pain management since she is still requiring PCA. We'll try and wean off as her condition improves. Discussed case with Sickle Cell Physician Dr. Liston Alba who recommended not transfusing patient and starting back Hydroxyurea. Patient's Hb increasing slightly and Reticulocyte count is 22.6. Patient diuresing well and now is only positive 9.6 Liters. Will D/C IV Lasix and continue to monitor.   Assessment & Plan:   Principal Problem:   Sickle cell crisis (HCC) Active Problems:   Sickle cell disease (HCC)   Elevated LFTs   Hyperbilirubinemia   Thrombocytopenia (HCC)   Chronic pain syndrome   Anemia of chronic disease   Bacteremia due to Staphylococcus aureus  1. Acute Sickle Cell Pain Crisis. -Continues to have uncontrolled pain, requiring Dilaudid PCA. Will attempt to Wean down Dilaudid PCA -She is continued on her long-acting narcotics with Oxycodone 10 mg po q4hprn for Moderate Pain -Increased MS Contin to 30 mg po q12h.  -Wean off PCA as her pain improves; Upon discussion with the patient she was taking Oxycodone 10 mg po q4hprn only as needed as an outpatient and started taking it scheduled when she was going into crisis. Will add 10 mg of  Oxycodone q6h Scheduled to attempt to wean off PCA -Discussed Case with Dr. Liston Alba who recommended starting back on 1000 mg of Hydroxyurea and recommended against transfusion of pRBCs based on Reticulocyte % count being 22.6  -Continue to Ambulate  2. Sickle Cell Anemia.  -She did receive 2 units of PRBCs and 4/5 with improvement of hemoglobin. She received another 2 units on 4/19 in preparation for surgery for Removal of Port-A-Cath.  -Hb/Hctfrom 5.4/15.4 to  5.9/17.1 -LDH was 291 yesterday; Reticulocyte Count Percentage was 22.6 and Retic Count Manually was 406.8 -She reports feeling increasingly weak and tired. There is no obvious bleeding.  -This is likely related to her sickle crisis.  -Patient did not get 2 units of Blood that were ordered for her yesterday; Infusion was stopped prior to any blood being transfused -C/w 1000 mg of Hydroxyurea at this time based on Dr. Zigmund Daniel Recommendation to increase HbF -Continue to monitor and repeat CBC in AM along with Reticulocyte Count  3. Staph Aureus Bacteremia.  [facility] course has been complicated by development of fever.  -Chest x-ray did not show any significant findings.  -Blood cultures returned positive for MSSA bacteremia. -Discussed with Infectious Disease who recommended removal of Port-A-Cath which was completed on 4/20.  -Catheter tip culture also positive for staph aureus. -Repeat blood cultures drawn on 4/19 have shown no growth. -TEE did not show any evidence of vegetations and MRI L spine did not show infection/discitis.  -Since this is a recurrent bacteremia (last episode was in 10/2016), she will need 4 weeks of IV antibiotics to  be completed on 5/17.  She is currently on IV Cefazolin 2 gram q8h -Once antibiotics are complete, she should have repeat blood cultures drawn on 5/22 to ensure that bacteremia has cleared, prior to any further port placement.   4. Thrombocytopenia. Improved -Possibly related to crisis.  Improved as Plts went from 86 ->417 -Repeat CBC in AM  5. Chronic Hyperbilirubinemia, improving -T Bili went from 16.7 -> 13.7 -> 11.8  -Likely secondary to hemolysis from Sickle Cell.  -No abdominal pain, nausea or vomiting. Continue to monitor. -Repeat CMP in AM  6. Lower Extremity Edema/Anasarca  -Per Record Patient was +13.7 Liters and has gained 28 lbs since admission; Now she is +9.6 Liters and 183 (down 13 pounds) -Likely related to aggressive hydration and hypoalbuminemia.  -Fluids have been discontinued.  -She was on IV Lasix 40 mg BID with good urine output but now Lasix has been discontinued.  -Continue to monitor Strict I's and O's and Daily Weights and re-evaluate need for IV Lasix in AM  7. AKI. Improved -BUN/Cr went from 28/1.40 (on 01/13/17) -> 7/0.46 -Possibly related to toradol and lasix.  -Toradol has been discontinued. Renal function has since improved. Creatinine currently stable -Repeat CMP in AM  8. Hypoalbuminemia.  -Albumin Level was 2.1 this AM -No proteinuria on urinalysis. Likely related to poor po intake.  -Nutrition consult -C/w Feeding Supplement 30 mL po BID  9. Hypokalemia -Patient's K+ this AM was 3.1 -Replete with Potassium Chloride 40 mEQ po BID -Continue to Monitor and Repeat CMP in AM  10. Hypomagnesemia, improved -Mag Level was 1.3; Magnesium now 1.8 -Replete with IV 2 grams of Mag Sulfate x2 yesterday; -Startd po Mag Oxide 400 mg po BID at the recommendation of Dr. Zigmund Daniel -Repeat Mag Level in AM  11. Abnormal LFT's/Elevated AST -AST this Am was 109 and ALT was 10; (AST was 104 yesterday and ALT was 8) -Repeat CMP in AM and continue to Trend  12. Leukocytosis, improving -Likely reactive to pain and Sickle Cell  -WBC went from 17.9 -> 15.4 -> 12.9 -On Abx with Cefazolin 2 grams IV q8h -Repeat CBC in AM and continue to Monitor  DVT prophylaxis: SCDs Code Status: FULL CODE Family Communication: No Family present at  bedside Disposition Plan: Home when medically stable for D/C  Consultants:   Cardiology for TEE  General Surgery for Port-A-Cath Removal  Infectious Diseases via Phone Consultation   Dr. Liston Alba of Sickle Cell   Procedures:  2D echo: - Left ventricle: The cavity size was normal. Wall thickness was normal. Systolic function was normal. The estimated ejection fraction was in the range of 60% to 65%. Wall motion was normal; there were no regional wall motion abnormalities. The study is not technically sufficient to allow evaluation of LV diastolic function. - Aortic valve: Valve area (VTI): 2.19 cm^2. Valve area (Vmax): 2.23 cm^2. Valve area (Vmean): 2.14 cm^2. - Left atrium: The atrium was moderately dilated. - Right atrium: The atrium was mildly dilated. - Pulmonary arteries: Systolic pressure was moderately increased. PA peak pressure: 37 mm Hg (S). - Pericardium, extracardiac: There is a small circumferential pericardial effusion.  - Technically adequate study.   TEE: No evidence of endocarditis. There was no evidence of a  vegetation.    Removal of portacath by Dr. Arnoldo Morale on 4/20  Placement of femoral central line by Dr. Arnoldo Morale on 4/20 .   Antimicrobials:  Anti-infectives    Start     Dose/Rate Route Frequency Ordered Stop  01/09/17 2000  ceFAZolin (ANCEF) IVPB 1 g/50 mL premix - [place]  Status:  Discontinued     2 g 200 mL/hr over 30 Minutes Intravenous Every 8 hours 01/09/17 1156 01/09/17 1157   01/09/17 2000  ceFAZolin (ANCEF) 2 g in dextrose 5 % 100 mL IVPB     2 g 200 mL/hr over 30 Minutes Intravenous Every 8 hours 01/09/17 1157     01/09/17 1800  vancomycin (VANCOCIN) IVPB 750 mg/150 ml premix  Status:  Discontinued     750 mg 150 mL/hr over 60 Minutes Intravenous Every 8 hours 01/09/17 0924 01/09/17 1156   01/09/17 1000  vancomycin (VANCOCIN) 1,500 mg in sodium chloride 0.9 % 500 mL IVPB     1,500 mg 250 mL/hr over 120  Minutes Intravenous  Once 01/09/17 0924 01/09/17 1236   01/03/17 1300  metroNIDAZOLE (FLAGYL) tablet 500 mg  Status:  Discontinued     500 mg Oral 2 times daily 01/03/17 1255 01/12/17 1225     Subjective: Seen and examined at bedside and feels legs are swollen. States pain level is still around a 7-8. No Nausea or vomiting and was ambulating the halls but had a little trouble because of pain in her legs. No other concerns or complaints.   Objective: Vitals:   01/17/17 0800 01/17/17 1200 01/17/17 1508 01/17/17 1600  BP:   118/64   Pulse:   (!) 101   Resp: 16 15 16 12   Temp:   99.3 F (37.4 C)   TempSrc:   Oral   SpO2: 97% 96% 96% 95%  Weight:      Height:        Intake/Output Summary (Last 24 hours) at 01/17/17 1736 Last data filed at 01/17/17 1316  Gross per 24 hour  Intake          1185.34 ml  Output             4400 ml  Net         -3214.66 ml   Filed Weights   01/02/17 2110 01/14/17 1840 01/17/17 0408  Weight: 76.2 kg (168 lb) 88.9 kg (196 lb) 83 kg (183 lb)   Examination: Physical Exam:  Constitutional: WN/WD obese AAF, NAD and appears calm and comfortable sitting in bed Eyes: Lids and conjunctivae normal,  ENMT: External Ears, Nose appear normal. Grossly normal hearing.  Neck: Appears normal, supple, no cervical masses, normal ROM, no appreciable thyromegaly, no visible JVD Respiratory: Diminished to auscultation bilaterally, no wheezing, rales, rhonchi or crackles. Normal respiratory effort and patient is not tachypenic. No accessory muscle use. Patient wearing supplemental O2 via [place].   Cardiovascular: Slightly tachycardic and loud S1 S2, no appreciable murmurs / rubs / gallops. 1-2+ Lower extremity pitting edema.  Abdomen: Soft, non-tender, non-distended. No masses palpated. No appreciable hepatosplenomegaly. Bowel sounds positive x4.  GU: Deferred. Musculoskeletal: No clubbing / cyanosis of digits/nails. No joint deformity upper and lower extremities.  Skin: No  rashes, lesions, ulcers on limited skin evaluation. No induration; Warm and dry.  Neurologic: CN 2-12 grossly intact with no focal deficits.  Romberg sign cerebellar reflexes not assessed.  Psychiatric: Normal judgment and insight. Alert and oriented x 3. Normal mood and appropriate affect.   Data Reviewed: I have personally reviewed following labs and imaging studies  CBC:  Recent Labs Lab 01/13/17 0610 01/14/17 0507 01/15/17 0445 01/16/17 0522 01/17/17 0538  WBC 14.5* 16.8* 17.9* 15.4* 12.9*  NEUTROABS  --   --   --   --  7.2  HGB 6.6* 6.2* 5.3* 5.4* 5.9*  HCT 17.3* 17.5* 15.3* 15.4* 17.1*  MCV 90.1 91.6 92.7 94.5 95.0  PLT 136* 189 260 351 433*   Basic Metabolic Panel:  Recent Labs Lab 01/13/17 0610 01/14/17 0507 01/15/17 0445 01/16/17 0522 01/17/17 0538  NA 136 139 139 138 137  K 4.0 4.1 3.6 3.0* 3.1*  CL 106 110 110 105 100*  CO2 24 23 26 27 30   GLUCOSE 96 103* 90 94 89  BUN 28* 28* 20 11 7   CREATININE 1.40* 0.97 0.61 0.47 0.46  CALCIUM 7.6* 7.8* 7.9* 7.9* 7.8*  MG  --   --   --  1.3* 1.8  PHOS  --   --   --   --  3.3   GFR: Estimated Creatinine Clearance: 99.1 mL/min (by C-G formula based on SCr of 0.46 mg/dL). Liver Function Tests:  Recent Labs Lab 01/13/17 0610 01/16/17 0522 01/17/17 0538  AST 101* 104* 109*  ALT 11* 8* 10*  ALKPHOS 121 125 133*  BILITOT 16.7* 13.7* 11.8*  PROT 5.3* 5.8* 6.1*  ALBUMIN 1.5* 2.1* 2.1*   No results for input(s): LIPASE, AMYLASE in the last 168 hours. No results for input(s): AMMONIA in the last 168 hours. Coagulation Profile: No results for input(s): INR, PROTIME in the last 168 hours. Cardiac Enzymes: No results for input(s): CKTOTAL, CKMB, CKMBINDEX, TROPONINI in the last 168 hours. BNP (last 3 results) No results for input(s): PROBNP in the last 8760 hours. HbA1C: No results for input(s): HGBA1C in the last 72 hours. CBG: No results for input(s): GLUCAP in the last 168 hours. Lipid Profile: No results for  input(s): CHOL, HDL, LDLCALC, TRIG, CHOLHDL, LDLDIRECT in the last 72 hours. Thyroid Function Tests: No results for input(s): TSH, T4TOTAL, FREET4, T3FREE, THYROIDAB in the last 72 hours. Anemia Panel:  Recent Labs  01/16/17 1553 01/17/17 0538  RETICCTPCT 20.0* 22.6*   Sepsis Labs: No results for input(s): PROCALCITON, LATICACIDVEN in the last 168 hours.  Recent Results (from the past 240 hour(s))  Culture, blood (Routine X 2) w Reflex to ID Panel     Status: Abnormal   Collection Time: 01/08/17  3:14 PM  Result Value Ref Range Status   Specimen Description PORTA CATH  Final   Special Requests   Final    BOTTLES DRAWN AEROBIC ONLY Blood Culture adequate volume   Culture  Setup Time   Final    GRAM POSITIVE COCCI IN CLUSTERS Gram Stain Report Called to,Read Back By and Verified With: TETREAULT,H AT 0700 BY HUFFINES,S ON 01/09/17.    Culture STAPHYLOCOCCUS AUREUS (A)  Final   Report Status 01/11/2017 FINAL  Final   Organism ID, Bacteria STAPHYLOCOCCUS AUREUS  Final      Susceptibility   Staphylococcus aureus - MIC*    CIPROFLOXACIN <=0.5 SENSITIVE Sensitive     ERYTHROMYCIN <=0.25 SENSITIVE Sensitive     GENTAMICIN <=0.5 SENSITIVE Sensitive     OXACILLIN <=0.25 SENSITIVE Sensitive     TETRACYCLINE <=1 SENSITIVE Sensitive     VANCOMYCIN <=0.5 SENSITIVE Sensitive     TRIMETH/SULFA <=10 SENSITIVE Sensitive     CLINDAMYCIN <=0.25 SENSITIVE Sensitive     RIFAMPIN <=0.5 SENSITIVE Sensitive     Inducible Clindamycin NEGATIVE Sensitive     * STAPHYLOCOCCUS AUREUS  Blood Culture ID Panel (Reflexed)     Status: Abnormal   Collection Time: 01/08/17  3:14 PM  Result Value Ref Range Status   Enterococcus species NOT DETECTED NOT DETECTED  Final   Listeria monocytogenes NOT DETECTED NOT DETECTED Final   Staphylococcus species DETECTED (A) NOT DETECTED Final    Comment: CRITICAL RESULT CALLED TO, READ BACK BY AND VERIFIED WITH: L. Seay Pharm.D. 10:45 01/09/17 ([email or web address])     Staphylococcus aureus DETECTED (A) NOT DETECTED Final    Comment: Methicillin (oxacillin) susceptible Staphylococcus aureus (MSSA). Preferred therapy is anti staphylococcal beta lactam antibiotic (Cefazolin or Nafcillin), unless clinically contraindicated. CRITICAL RESULT CALLED TO, READ BACK BY AND VERIFIED WITH: L. Seay Pharm.D. 10:45 01/09/17 ([email or web address])    Methicillin resistance NOT DETECTED NOT DETECTED Final   Streptococcus species NOT DETECTED NOT DETECTED Final   Streptococcus agalactiae NOT DETECTED NOT DETECTED Final   Streptococcus pneumoniae NOT DETECTED NOT DETECTED Final   Streptococcus pyogenes NOT DETECTED NOT DETECTED Final   Acinetobacter baumannii NOT DETECTED NOT DETECTED Final   Enterobacteriaceae species NOT DETECTED NOT DETECTED Final   Enterobacter cloacae complex NOT DETECTED NOT DETECTED Final   Escherichia coli NOT DETECTED NOT DETECTED Final   Klebsiella oxytoca NOT DETECTED NOT DETECTED Final   Klebsiella pneumoniae NOT DETECTED NOT DETECTED Final   Proteus species NOT DETECTED NOT DETECTED Final   Serratia marcescens NOT DETECTED NOT DETECTED Final   Haemophilus influenzae NOT DETECTED NOT DETECTED Final   Neisseria meningitidis NOT DETECTED NOT DETECTED Final   Pseudomonas aeruginosa NOT DETECTED NOT DETECTED Final   Candida albicans NOT DETECTED NOT DETECTED Final   Candida glabrata NOT DETECTED NOT DETECTED Final   Candida krusei NOT DETECTED NOT DETECTED Final   Candida parapsilosis NOT DETECTED NOT DETECTED Final   Candida tropicalis NOT DETECTED NOT DETECTED Final    Comment: Performed at [facility], [address], [place], [place] [address]  Culture, blood (Routine X 2) w Reflex to ID Panel     Status: Abnormal   Collection Time: 01/08/17  3:29 PM  Result Value Ref Range Status   Specimen Description BLOOD RIGHT HAND  Final   Special Requests   Final    BOTTLES DRAWN AEROBIC ONLY Blood Culture results may not be optimal due to an inadequate  volume of blood received in culture bottles   Culture  Setup Time   Final    GRAM POSITIVE COCCI IN CLUSTERS Gram Stain Report Called to,Read Back By and Verified With: [facility] @ 1427 ON 4.18.18 BY BAYSE,L AEROBIC BOTTLE ONLY CRITICAL VALUE NOTED.  VALUE IS CONSISTENT WITH PREVIOUSLY REPORTED AND CALLED VALUE.    Culture (A)  Final    STAPHYLOCOCCUS AUREUS SUSCEPTIBILITIES PERFORMED ON PREVIOUS CULTURE WITHIN THE LAST 5 DAYS. Performed at [facility], [place] [address], [place],  [address]    Report Status 01/11/2017 FINAL  Final  Culture, blood (routine x 2)     Status: None   Collection Time: 01/10/17  3:53 PM  Result Value Ref Range Status   Specimen Description BLOOD RIGHT FOREARM  Final   Special Requests   Final    BOTTLES DRAWN AEROBIC AND ANAEROBIC Blood Culture adequate volume   Culture NO GROWTH 5 DAYS  Final   Report Status 01/15/2017 FINAL  Final  Culture, blood (routine x 2)     Status: None   Collection Time: 01/10/17  3:59 PM  Result Value Ref Range Status   Specimen Description BLOOD LEFT HAND  Final   Special Requests   Final    BOTTLES DRAWN AEROBIC AND ANAEROBIC Blood Culture adequate volume   Culture NO GROWTH 5 DAYS  Final  Report Status 01/15/2017 FINAL  Final  Surgical pcr screen     Status: Abnormal   Collection Time: 01/11/17 12:46 AM  Result Value Ref Range Status   MRSA, PCR NEGATIVE NEGATIVE Final   Staphylococcus aureus POSITIVE (A) NEGATIVE Final    Comment:        The Xpert SA Assay (FDA approved for NASAL specimens in patients over 65 years of age), is one component of a comprehensive surveillance program.  Test performance has been validated by [facility] for patients greater than or equal to 81 year old. It is not intended to diagnose infection nor to guide or monitor treatment. RESULT CALLED TO, READ BACK BY AND VERIFIED WITH: [facility] AT 0557 BY HUFFINES,S ON 01/11/17.   Cath Tip Culture     Status: Abnormal    Collection Time: 01/11/17  2:33 PM  Result Value Ref Range Status   Specimen Description PORTA CATH  Final   Special Requests   Final    CATH TIP SENT IN SWAB Performed at [facility], [place] [address], [place], [place] [address]    Culture <10,000 COLONIES/mL STAPHYLOCOCCUS AUREUS (A)  Final   Report Status 01/14/2017 FINAL  Final    Radiology Studies: No results found.   Scheduled Meds: . docusate sodium  100 mg Oral BID  . feeding supplement (PRO-STAT SUGAR FREE 64)  30 mL Oral BID  . folic acid  1 mg Oral Daily  . gabapentin  100 mg Oral TID  . HYDROmorphone   Intravenous Q4H  . hydroxyurea  1,000 mg Oral Daily  . L-glutamine  5 g Oral BID  . loratadine  10 mg Oral Daily  . magnesium oxide  400 mg Oral BID  . morphine  30 mg Oral Q12H  . oxyCODONE  15 mg Oral Q6H  . polyethylene glycol  17 g Oral Daily  . potassium chloride  40 mEq Oral BID  . senna-docusate  1 tablet Oral BID   Continuous Infusions: . sodium chloride 250 mL (01/09/17 1543)  . sodium chloride 10 mL/hr at 01/09/17 1543  . ceFAZolin (ANCEF) IVPB (doses >1 g) Stopped (01/17/17 1316)    LOS: 15 days   Kerney Elbe, DO [facility] Pager [phone]  If 7PM-7AM, please contact night-coverage [email or web address] Password [facility] 01/17/2017, 5:36 PM

## 2017-01-17 NOTE — Progress Notes (Signed)
CRITICAL VALUE ALERT  Critical value received:  hgb - 5.9  Date of notification:  01/17/17  Time of notification:  0730  Critical value read back:Yes.    Nurse who received alert:  Jerrye Noble RN  MD notified (1st page):  Dr. Alfredia Ferguson  Time of first page:  0750  MD notified (2nd page):  Time of second page:  Responding MD:  Dr. Alfredia Ferguson  Time MD responded:  [phone]

## 2017-01-17 NOTE — Progress Notes (Signed)
Pt has been offered on multiple occasion to get up and walk. Adv pt that I would get her an oxygen tank and help her up and down the hall. Pt stated she may later. When offered again she refused and stated that she wanted to wait til day time. Educated on importance of getting up and moving around. Pt verbalized understanding.

## 2017-01-18 DIAGNOSIS — R7981 Abnormal blood-gas level: Secondary | ICD-10-CM

## 2017-01-18 LAB — CBC WITH DIFFERENTIAL/PLATELET
BASOS ABS: 0 10*3/uL (ref 0.0–0.1)
Basophils Relative: 0 %
EOS ABS: 0.7 10*3/uL (ref 0.0–0.7)
Eosinophils Relative: 7 %
HCT: 16.2 % — ABNORMAL LOW (ref 36.0–46.0)
Hemoglobin: 5.7 g/dL — CL (ref 12.0–15.0)
LYMPHS ABS: 2.9 10*3/uL (ref 0.7–4.0)
Lymphocytes Relative: 28 %
MCH: 33.1 pg (ref 26.0–34.0)
MCHC: 34.6 g/dL (ref 30.0–36.0)
MCV: 95.9 fL (ref 78.0–100.0)
MONO ABS: 0.9 10*3/uL (ref 0.1–1.0)
Monocytes Relative: 9 %
Neutro Abs: 5.9 10*3/uL (ref 1.7–7.7)
Neutrophils Relative %: 56 %
PLATELETS: 429 10*3/uL — AB (ref 150–400)
RBC: 1.69 MIL/uL — AB (ref 3.87–5.11)
RDW: 22.4 % — AB (ref 11.5–15.5)
WBC: 10.4 10*3/uL (ref 4.0–10.5)

## 2017-01-18 LAB — COMPREHENSIVE METABOLIC PANEL
ALBUMIN: 1.8 g/dL — AB (ref 3.5–5.0)
ALT: 10 U/L — ABNORMAL LOW (ref 14–54)
AST: 88 U/L — AB (ref 15–41)
Alkaline Phosphatase: 119 U/L (ref 38–126)
Anion gap: 6 (ref 5–15)
BUN: 5 mg/dL — AB (ref 6–20)
CHLORIDE: 105 mmol/L (ref 101–111)
CO2: 26 mmol/L (ref 22–32)
Calcium: 6.9 mg/dL — ABNORMAL LOW (ref 8.9–10.3)
Creatinine, Ser: 0.43 mg/dL — ABNORMAL LOW (ref 0.44–1.00)
GFR calc Af Amer: >60 mL/min (ref >60)
GFR calc non Af Amer: >60 mL/min (ref >60)
GLUCOSE: 109 mg/dL — AB (ref 65–99)
POTASSIUM: 2.5 mmol/L — AB (ref 3.5–5.1)
Sodium: 137 mmol/L (ref 135–145)
Total Bilirubin: 9.7 mg/dL — ABNORMAL HIGH (ref 0.3–1.2)
Total Protein: 5.4 g/dL — ABNORMAL LOW (ref 6.5–8.1)

## 2017-01-18 LAB — RETICULOCYTES: RBC.: 1.69 MIL/uL — AB (ref 3.87–5.11)

## 2017-01-18 LAB — MAGNESIUM: MAGNESIUM: 1.4 mg/dL — AB (ref 1.7–2.4)

## 2017-01-18 LAB — PHOSPHORUS: Phosphorus: 2.5 mg/dL (ref 2.5–4.6)

## 2017-01-18 MED ORDER — POTASSIUM CHLORIDE 2 MEQ/ML IV SOLN: 30.0000 meq | Freq: Once | INTRAVENOUS | Status: DC

## 2017-01-18 MED ORDER — POTASSIUM CHLORIDE CRYS ER 20 MEQ PO TBCR
40.0000 meq | EXTENDED_RELEASE_TABLET | Freq: Three times a day (TID) | ORAL | Status: DC
  Administered 2017-01-18 – 2017-01-19 (×4): 40 meq via ORAL
  Filled 2017-01-18 (×4): qty 2

## 2017-01-18 MED ORDER — POTASSIUM CHLORIDE 10 MEQ/100ML IV SOLN
10.0000 meq | INTRAVENOUS | Status: AC
  Administered 2017-01-18 (×3): 10 meq via INTRAVENOUS
  Filled 2017-01-18 (×3): qty 100

## 2017-01-18 MED ORDER — OXYCODONE HCL 5 MG PO TABS
10.0000 mg | ORAL_TABLET | ORAL | Status: DC
  Administered 2017-01-18 – 2017-01-25 (×40): 10 mg via ORAL
  Filled 2017-01-18 (×40): qty 2

## 2017-01-18 MED ORDER — FUROSEMIDE 10 MG/ML IJ SOLN
40.0000 mg | Freq: Every day | INTRAMUSCULAR | Status: DC
  Administered 2017-01-18: 40 mg via INTRAVENOUS
  Filled 2017-01-18: qty 4

## 2017-01-18 MED ORDER — MAGNESIUM SULFATE 4 GM/100ML IV SOLN
4.0000 g | Freq: Once | INTRAVENOUS | Status: AC
  Administered 2017-01-18: 4 g via INTRAVENOUS
  Filled 2017-01-18: qty 100

## 2017-01-18 NOTE — Care Management Important Message (Signed)
Important Message  Patient Details  Name: Dawn [name] MRN: [id number] Date of Birth: Feb 09, 1981   Medicare Important Message Given:  Yes    Sherald Barge, RN 01/18/2017, 1:19 PM

## 2017-01-18 NOTE — Progress Notes (Signed)
CRITICAL VALUE ALERT  Critical value received:  POTASSIUM   2.5   Date of notification:  01/18/2017  Time of notification:  2081 AM  Critical value read back:Yes.    Nurse who received alert:  Radene Gunning  MD notified (1st page):  Dr Alfredia Ferguson  Time of first page:  [phone]  MD notified (2nd page):  Time of second page:  Responding MD:  awaiting  Time MD responded:  awaiting

## 2017-01-18 NOTE — Progress Notes (Signed)
PROGRESS NOTE    Dawn [name]  HYI:[id number] DOB: [birth year] DOA: 01/02/2017 PCP: [name], FNP   Brief Narrative:  36 year old female with history of sickle cell anemia, presented with sickle cell crisis. She's had 13 admissions in the past 6 months and 27 ED visits in the last 6 months. She was admitted for sickle cell anemia pain crisis. She is currently still on a Dilaudid PCA and was transfused 4 units of PRBCs for worsening anemia. Hospital course complicated by development of fever and evidence of staph aureus bacteremia. Port-A-Cath that was placed earlier this month has been removed. Further workup including TEE did not show any vegetations. PICC line was placed for long-term antibiotics. Other barriers to discharge his pain management since she is still requiring PCA. We'll try and wean off as her condition improves. Discussed case with Sickle Cell Physician Dr. Liston Alba who recommended not transfusing patient and starting back Hydroxyurea. Patient's Hb increasing slightly and Reticulocyte count is 22.6. Patient diuresing well and now is only positive 4.4 Liters. Will restart IV Diuresis with IV Lasix 40 mg Daily as she is still volume overloaded.  Assessment & Plan:   Principal Problem:   Sickle cell crisis (HCC) Active Problems:   Sickle cell disease (HCC)   Elevated LFTs   Hyperbilirubinemia   Thrombocytopenia (HCC)   Chronic pain syndrome   Anemia of chronic disease   Bacteremia due to Staphylococcus aureus  1. Acute Sickle Cell Pain Crisis. -Continues to have uncontrolled pain, requiring Dilaudid PCA. Will attempt to Wean down Dilaudid PCA -She is continued on her long-acting narcotics with Oxycodone 10 mg po q4hprn for Moderate Pain -C/w MS Contin to 30 mg po q12h.  -Wean off PCA as her pain improves; Upon discussion with the patient she was taking Oxycodone 10 mg po q4hprn only as needed as an outpatient and started taking it scheduled when she was going  into crisis.  -Changed 10 mg of Oxycodone q6h Scheduled to q4h to attempt to wean off PCA -Discussed Case with Dr. Liston Alba who recommended starting back on 1000 mg of Hydroxyurea and recommended against transfusion of pRBCs based on Reticulocyte % count being >23.0  -Continue to Ambulate  2. Sickle Cell Anemia.  -She did receive 2 units of PRBCs and 4/5 with improvement of hemoglobin. She received another 2 units on 4/19 in preparation for surgery for Removal of Port-A-Cath.  -Hb/Hct went from 5.9/17.1 -> 5.7/16.2 -LDH was 291; Reticulocyte Count Percentage was >23.0 and Retic Count Manually was not calculated -There is no obvious bleeding.  -This is likely related to her sickle crisis.  -Patient did not get 2 units of Blood that were ordered for her 01/16/17; Infusion was stopped prior to any blood being transfused -C/w 1000 mg of Hydroxyurea at this time based on Dr. Zigmund Daniel Recommendation to increase HbF -Continue to monitor and repeat CBC on Sunday along with Reticulocyte Count  3. Staph Aureus Bacteremia.  [facility] course has been complicated by development of fever.  -Chest x-ray did not show any significant findings.  -Blood cultures returned positive for MSSA bacteremia. -Discussed with Infectious Disease who recommended removal of Port-A-Cath which was completed on 4/20.  -Catheter tip culture also positive for staph aureus. -Repeat blood cultures drawn on 4/19 have shown no growth. -TEE did not show any evidence of vegetations and MRI L spine did not show infection/discitis.  -Since this is a recurrent bacteremia (last episode was in 10/2016), she will need 4 weeks of  IV antibiotics to be completed on 5/17.   -She is currently on IV Cefazolin 2 gram q8h -Once antibiotics are complete, she should have repeat blood cultures drawn on 5/22 to ensure that bacteremia has cleared, prior to any further port placement.   4. Thrombocytopenia. Improved -Possibly related to  crisis. Improved as Plts went from 86 -> 429 -Repeat CBC on Sunday  5. Chronic Hyperbilirubinemia, improving -T Bili went from 16.7 -> 13.7 -> 11.8 -> 9.7 -Likely secondary to hemolysis from Sickle Cell.  -No abdominal pain, nausea or vomiting. Continue to monitor. -Repeat CMP on Sunday  6. Lower Extremity Edema/Anasarca  -Per Record Patient was +13.7 Liters and has gained 28 lbs since admission; Now she is +3.2  Liters and 182 (down 14 pounds) -Likely related to aggressive hydration and hypoalbuminemia.  -Fluids have been discontinued.  -She was on IV Lasix 40 mg BID and now on IV Lasix 40 mg daily -Continue to monitor Strict I's and O's and Daily Weights and re-evaluate need for continued IV Lasix in AM  7. AKI, Improved -BUN/Cr went from 28/1.40 (on 01/13/17) -> 7/0.46 -> 5/0.43 -Possibly related to Toradol and lasix.  -Toradol has been discontinued. Renal function has since improved. Creatinine currently stable -Repeat CMP EOD  8. Hypoalbuminemia.  -Albumin Level was 1.8 this AM -No proteinuria on urinalysis. Likely related to poor po intake.  -Nutrition consult -C/w Feeding Supplement 30 mL po BID  9. Hypokalemia -Patient's K+ this AM was 2.5 -Replete with Potassium Chloride 40 mEQ po TID and with IV 30 mEQ of KCl -Continue to Monitor and Repeat CMP on Sunday  10. Hypomagnesemia -Mag Level was 1.4 -Replete with IV 4 grams of Mag Sulfate -C/w po Mag Oxide 400 mg po BID at the recommendation of Dr. Zigmund Daniel -Repeat Mag Level on Sunday  11. Abnormal LFT's/Elevated AST -AST this Am was 109 and ALT was 10; (AST was 104 yesterday and ALT was 8) -Continue to Trend  12. Leukocytosis, improved -Likely reactive to pain and Sickle Cell  -WBC went from 17.9 -> 15.4 -> 12.9 -> 10.4 -On Abx with Cefazolin 2 grams IV q8h -Repeat CBC on Sunday and continue to Monitor  DVT prophylaxis: SCDs Code Status: FULL CODE Family Communication: No Family present at  bedside Disposition Plan: Home when medically stable for D/C  Consultants:   Cardiology for TEE  General Surgery for Port-A-Cath Removal  Infectious Diseases via Phone Consultation   Dr. Liston Alba of Sickle Cell   Procedures:  2D echo: - Left ventricle: The cavity size was normal. Wall thickness was normal. Systolic function was normal. The estimated ejection fraction was in the range of 60% to 65%. Wall motion was normal; there were no regional wall motion abnormalities. The study is not technically sufficient to allow evaluation of LV diastolic function. - Aortic valve: Valve area (VTI): 2.19 cm^2. Valve area (Vmax): 2.23 cm^2. Valve area (Vmean): 2.14 cm^2. - Left atrium: The atrium was moderately dilated. - Right atrium: The atrium was mildly dilated. - Pulmonary arteries: Systolic pressure was moderately increased. PA peak pressure: 37 mm Hg (S). - Pericardium, extracardiac: There is a small circumferential pericardial effusion.  - Technically adequate study.   TEE: No evidence of endocarditis. There was no evidence of a  vegetation.    Removal of portacath by Dr. Arnoldo Morale on 4/20  Placement of femoral central line by Dr. Arnoldo Morale on 4/20 .   Antimicrobials:  Anti-infectives    Start     Dose/Rate  Route Frequency Ordered Stop   01/09/17 2000  ceFAZolin (ANCEF) IVPB 1 g/50 mL premix - [place]  Status:  Discontinued     2 g 200 mL/hr over 30 Minutes Intravenous Every 8 hours 01/09/17 1156 01/09/17 1157   01/09/17 2000  ceFAZolin (ANCEF) 2 g in dextrose 5 % 100 mL IVPB     2 g 200 mL/hr over 30 Minutes Intravenous Every 8 hours 01/09/17 1157     01/09/17 1800  vancomycin (VANCOCIN) IVPB 750 mg/150 ml premix  Status:  Discontinued     750 mg 150 mL/hr over 60 Minutes Intravenous Every 8 hours 01/09/17 0924 01/09/17 1156   01/09/17 1000  vancomycin (VANCOCIN) 1,500 mg in sodium chloride 0.9 % 500 mL IVPB     1,500 mg 250 mL/hr over 120  Minutes Intravenous  Once 01/09/17 0924 01/09/17 1236   01/03/17 1300  metroNIDAZOLE (FLAGYL) tablet 500 mg  Status:  Discontinued     500 mg Oral 2 times daily 01/03/17 1255 01/12/17 1225     Subjective: Seen and examined and still stated that pain was around a 7-8. States she has been ambulating. Also states she feels her legs are more tight today. No nausea or vomiting. No other concerns or complaints.   Objective: Vitals:   01/18/17 0000 01/18/17 0345 01/18/17 0347 01/18/17 0500  BP: 134/68 140/85    Pulse: (!) 101 (!) 104    Resp: 14 14 14    Temp: 98.6 F (37 C) 99.9 F (37.7 C)    TempSrc: Oral Oral    SpO2: 96% 98% 98%   Weight:    82.6 kg (182 lb)  Height:        Intake/Output Summary (Last 24 hours) at 01/18/17 0729 Last data filed at 01/17/17 2030  Gross per 24 hour  Intake              820 ml  Output             2400 ml  Net            -1580 ml   Filed Weights   01/14/17 1840 01/17/17 0408 01/18/17 0500  Weight: 88.9 kg (196 lb) 83 kg (183 lb) 82.6 kg (182 lb)   Examination: Physical Exam:  Constitutional: WN/WD obese AAF, NAD and appears calm and comfortable sitting in bed Eyes: Lids and conjunctivae normal,  ENMT: External Ears, Nose appear normal. Grossly normal hearing.  Neck: Appears normal, supple, no cervical masses, normal ROM, no appreciable thyromegaly, no visible JVD Respiratory: Diminished to auscultation bilaterally, no wheezing, rales, rhonchi or crackles. Normal respiratory effort and patient is not tachypenic. No accessory muscle use. Patient wearing supplemental O2 via [place].   Cardiovascular: Slightly tachycardic and loud S1 S2, no appreciable murmurs / rubs / gallops. 2+ Lower extremity pitting edema.  Abdomen: Soft, non-tender, non-distended. No masses palpated. No appreciable hepatosplenomegaly. Bowel sounds positive x4.  GU: Deferred. Musculoskeletal: No clubbing / cyanosis of digits/nails. No joint deformity upper and lower extremities.   Skin: No rashes, lesions, ulcers on limited skin evaluation. No induration; Warm and dry.  Neurologic: CN 2-12 grossly intact with no focal deficits.  Romberg sign cerebellar reflexes not assessed.  Psychiatric: Normal judgment and insight. Alert and oriented x 3. Normal mood and appropriate affect.   Data Reviewed: I have personally reviewed following labs and imaging studies  CBC:  Recent Labs Lab 01/13/17 0610 01/14/17 0507 01/15/17 0445 01/16/17 0522 01/17/17 0538  WBC 14.5* 16.8* 17.9*  15.4* 12.9*  NEUTROABS  --   --   --   --  7.2  HGB 6.6* 6.2* 5.3* 5.4* 5.9*  HCT 17.3* 17.5* 15.3* 15.4* 17.1*  MCV 90.1 91.6 92.7 94.5 95.0  PLT 136* 189 260 351 476*   Basic Metabolic Panel:  Recent Labs Lab 01/13/17 0610 01/14/17 0507 01/15/17 0445 01/16/17 0522 01/17/17 0538  NA 136 139 139 138 137  K 4.0 4.1 3.6 3.0* 3.1*  CL 106 110 110 105 100*  CO2 24 23 26 27 30   GLUCOSE 96 103* 90 94 89  BUN 28* 28* 20 11 7   CREATININE 1.40* 0.97 0.61 0.47 0.46  CALCIUM 7.6* 7.8* 7.9* 7.9* 7.8*  MG  --   --   --  1.3* 1.8  PHOS  --   --   --   --  3.3   GFR: Estimated Creatinine Clearance: 99 mL/min (by C-G formula based on SCr of 0.46 mg/dL). Liver Function Tests:  Recent Labs Lab 01/13/17 0610 01/16/17 0522 01/17/17 0538  AST 101* 104* 109*  ALT 11* 8* 10*  ALKPHOS 121 125 133*  BILITOT 16.7* 13.7* 11.8*  PROT 5.3* 5.8* 6.1*  ALBUMIN 1.5* 2.1* 2.1*   No results for input(s): LIPASE, AMYLASE in the last 168 hours. No results for input(s): AMMONIA in the last 168 hours. Coagulation Profile: No results for input(s): INR, PROTIME in the last 168 hours. Cardiac Enzymes: No results for input(s): CKTOTAL, CKMB, CKMBINDEX, TROPONINI in the last 168 hours. BNP (last 3 results) No results for input(s): PROBNP in the last 8760 hours. HbA1C: No results for input(s): HGBA1C in the last 72 hours. CBG: No results for input(s): GLUCAP in the last 168 hours. Lipid Profile: No  results for input(s): CHOL, HDL, LDLCALC, TRIG, CHOLHDL, LDLDIRECT in the last 72 hours. Thyroid Function Tests: No results for input(s): TSH, T4TOTAL, FREET4, T3FREE, THYROIDAB in the last 72 hours. Anemia Panel:  Recent Labs  01/16/17 1553 01/17/17 0538  RETICCTPCT 20.0* 22.6*   Sepsis Labs: No results for input(s): PROCALCITON, LATICACIDVEN in the last 168 hours.  Recent Results (from the past 240 hour(s))  Culture, blood (Routine X 2) w Reflex to ID Panel     Status: Abnormal   Collection Time: 01/08/17  3:14 PM  Result Value Ref Range Status   Specimen Description PORTA CATH  Final   Special Requests   Final    BOTTLES DRAWN AEROBIC ONLY Blood Culture adequate volume   Culture  Setup Time   Final    GRAM POSITIVE COCCI IN CLUSTERS Gram Stain Report Called to,Read Back By and Verified With: TETREAULT,H AT 0700 BY HUFFINES,S ON 01/09/17.    Culture STAPHYLOCOCCUS AUREUS (A)  Final   Report Status 01/11/2017 FINAL  Final   Organism ID, Bacteria STAPHYLOCOCCUS AUREUS  Final      Susceptibility   Staphylococcus aureus - MIC*    CIPROFLOXACIN <=0.5 SENSITIVE Sensitive     ERYTHROMYCIN <=0.25 SENSITIVE Sensitive     GENTAMICIN <=0.5 SENSITIVE Sensitive     OXACILLIN <=0.25 SENSITIVE Sensitive     TETRACYCLINE <=1 SENSITIVE Sensitive     VANCOMYCIN <=0.5 SENSITIVE Sensitive     TRIMETH/SULFA <=10 SENSITIVE Sensitive     CLINDAMYCIN <=0.25 SENSITIVE Sensitive     RIFAMPIN <=0.5 SENSITIVE Sensitive     Inducible Clindamycin NEGATIVE Sensitive     * STAPHYLOCOCCUS AUREUS  Blood Culture ID Panel (Reflexed)     Status: Abnormal   Collection Time: 01/08/17  3:14 PM  Result Value Ref Range Status   Enterococcus species NOT DETECTED NOT DETECTED Final   Listeria monocytogenes NOT DETECTED NOT DETECTED Final   Staphylococcus species DETECTED (A) NOT DETECTED Final    Comment: CRITICAL RESULT CALLED TO, READ BACK BY AND VERIFIED WITH: L. Seay Pharm.D. 10:45 01/09/17 ([email or web address])     Staphylococcus aureus DETECTED (A) NOT DETECTED Final    Comment: Methicillin (oxacillin) susceptible Staphylococcus aureus (MSSA). Preferred therapy is anti staphylococcal beta lactam antibiotic (Cefazolin or Nafcillin), unless clinically contraindicated. CRITICAL RESULT CALLED TO, READ BACK BY AND VERIFIED WITH: L. Seay Pharm.D. 10:45 01/09/17 ([email or web address])    Methicillin resistance NOT DETECTED NOT DETECTED Final   Streptococcus species NOT DETECTED NOT DETECTED Final   Streptococcus agalactiae NOT DETECTED NOT DETECTED Final   Streptococcus pneumoniae NOT DETECTED NOT DETECTED Final   Streptococcus pyogenes NOT DETECTED NOT DETECTED Final   Acinetobacter baumannii NOT DETECTED NOT DETECTED Final   Enterobacteriaceae species NOT DETECTED NOT DETECTED Final   Enterobacter cloacae complex NOT DETECTED NOT DETECTED Final   Escherichia coli NOT DETECTED NOT DETECTED Final   Klebsiella oxytoca NOT DETECTED NOT DETECTED Final   Klebsiella pneumoniae NOT DETECTED NOT DETECTED Final   Proteus species NOT DETECTED NOT DETECTED Final   Serratia marcescens NOT DETECTED NOT DETECTED Final   Haemophilus influenzae NOT DETECTED NOT DETECTED Final   Neisseria meningitidis NOT DETECTED NOT DETECTED Final   Pseudomonas aeruginosa NOT DETECTED NOT DETECTED Final   Candida albicans NOT DETECTED NOT DETECTED Final   Candida glabrata NOT DETECTED NOT DETECTED Final   Candida krusei NOT DETECTED NOT DETECTED Final   Candida parapsilosis NOT DETECTED NOT DETECTED Final   Candida tropicalis NOT DETECTED NOT DETECTED Final    Comment: Performed at [facility], [address] [place], [place] [address]  Culture, blood (Routine X 2) w Reflex to ID Panel     Status: Abnormal   Collection Time: 01/08/17  3:29 PM  Result Value Ref Range Status   Specimen Description BLOOD RIGHT HAND  Final   Special Requests   Final    BOTTLES DRAWN AEROBIC ONLY Blood Culture results may not be optimal due to an inadequate  volume of blood received in culture bottles   Culture  Setup Time   Final    GRAM POSITIVE COCCI IN CLUSTERS Gram Stain Report Called to,Read Back By and Verified With: [facility] @ 1427 ON 4.18.18 BY BAYSE,L AEROBIC BOTTLE ONLY CRITICAL VALUE NOTED.  VALUE IS CONSISTENT WITH PREVIOUSLY REPORTED AND CALLED VALUE.    Culture (A)  Final    STAPHYLOCOCCUS AUREUS SUSCEPTIBILITIES PERFORMED ON PREVIOUS CULTURE WITHIN THE LAST 5 DAYS. Performed at [facility], [place] [address], [place], [place] [address]    Report Status 01/11/2017 FINAL  Final  Culture, blood (routine x 2)     Status: None   Collection Time: 01/10/17  3:53 PM  Result Value Ref Range Status   Specimen Description BLOOD RIGHT FOREARM  Final   Special Requests   Final    BOTTLES DRAWN AEROBIC AND ANAEROBIC Blood Culture adequate volume   Culture NO GROWTH 5 DAYS  Final   Report Status 01/15/2017 FINAL  Final  Culture, blood (routine x 2)     Status: None   Collection Time: 01/10/17  3:59 PM  Result Value Ref Range Status   Specimen Description BLOOD LEFT HAND  Final   Special Requests   Final    BOTTLES DRAWN AEROBIC  AND ANAEROBIC Blood Culture adequate volume   Culture NO GROWTH 5 DAYS  Final   Report Status 01/15/2017 FINAL  Final  Surgical pcr screen     Status: Abnormal   Collection Time: 01/11/17 12:46 AM  Result Value Ref Range Status   MRSA, PCR NEGATIVE NEGATIVE Final   Staphylococcus aureus POSITIVE (A) NEGATIVE Final    Comment:        The Xpert SA Assay (FDA approved for NASAL specimens in patients over 5 years of age), is one component of a comprehensive surveillance program.  Test performance has been validated by [facility] for patients greater than or equal to 54 year old. It is not intended to diagnose infection nor to guide or monitor treatment. RESULT CALLED TO, READ BACK BY AND VERIFIED WITH: [facility] AT 0557 BY HUFFINES,S ON 01/11/17.   Cath Tip Culture     Status: Abnormal    Collection Time: 01/11/17  2:33 PM  Result Value Ref Range Status   Specimen Description PORTA CATH  Final   Special Requests   Final    CATH TIP SENT IN SWAB Performed at [facility], [place] [address], [place], [place] [address]    Culture <10,000 COLONIES/mL STAPHYLOCOCCUS AUREUS (A)  Final   Report Status 01/14/2017 FINAL  Final    Radiology Studies: No results found.   Scheduled Meds: . docusate sodium  100 mg Oral BID  . feeding supplement (PRO-STAT SUGAR FREE 64)  30 mL Oral BID  . folic acid  1 mg Oral Daily  . gabapentin  100 mg Oral TID  . HYDROmorphone   Intravenous Q4H  . hydroxyurea  1,000 mg Oral Daily  . L-glutamine  5 g Oral BID  . loratadine  10 mg Oral Daily  . magnesium oxide  400 mg Oral BID  . morphine  30 mg Oral Q12H  . oxyCODONE  15 mg Oral Q6H  . polyethylene glycol  17 g Oral Daily  . potassium chloride  40 mEq Oral BID  . senna-docusate  1 tablet Oral BID   Continuous Infusions: . sodium chloride 250 mL (01/17/17 2044)  . sodium chloride 10 mL/hr at 01/09/17 1543  . ceFAZolin (ANCEF) IVPB (doses >1 g) Stopped (01/18/17 0430)    LOS: 16 days   Kerney Elbe, DO [facility] Pager [phone]  If 7PM-7AM, please contact night-coverage [email or web address] Password [facility] 01/18/2017, 7:29 AM

## 2017-01-19 ENCOUNTER — Inpatient Hospital Stay (HOSPITAL_COMMUNITY): Payer: Medicare Other

## 2017-01-19 DIAGNOSIS — R609 Edema, unspecified: Secondary | ICD-10-CM

## 2017-01-19 LAB — CBC WITH DIFFERENTIAL/PLATELET
BASOS ABS: 0.1 10*3/uL (ref 0.0–0.1)
BASOS PCT: 1 %
Eosinophils Absolute: 0.6 10*3/uL (ref 0.0–0.7)
Eosinophils Relative: 5 %
HCT: 20.3 % — ABNORMAL LOW (ref 36.0–46.0)
Hemoglobin: 7.2 g/dL — ABNORMAL LOW (ref 12.0–15.0)
Lymphocytes Relative: 18 %
Lymphs Abs: 2.1 10*3/uL (ref 0.7–4.0)
MCH: 34.4 pg — AB (ref 26.0–34.0)
MCHC: 35.5 g/dL (ref 30.0–36.0)
MCV: 97.1 fL (ref 78.0–100.0)
MONO ABS: 1.3 10*3/uL — AB (ref 0.1–1.0)
MONOS PCT: 11 %
NEUTROS PCT: 65 %
Neutro Abs: 7.5 10*3/uL (ref 1.7–7.7)
PLATELETS: 439 10*3/uL — AB (ref 150–400)
RBC: 2.09 MIL/uL — ABNORMAL LOW (ref 3.87–5.11)
RDW: 21.9 % — AB (ref 11.5–15.5)
WBC: 11.6 10*3/uL — ABNORMAL HIGH (ref 4.0–10.5)

## 2017-01-19 LAB — TYPE AND SCREEN
ABO/RH(D): A POS
Antibody Screen: NEGATIVE
UNIT DIVISION: 0
UNIT DIVISION: 0
UNIT DIVISION: 0
Unit division: 0

## 2017-01-19 LAB — BPAM RBC
BLOOD PRODUCT EXPIRATION DATE: 201805152359
BLOOD PRODUCT EXPIRATION DATE: 201805202359
BLOOD PRODUCT EXPIRATION DATE: 201805232359
BLOOD PRODUCT EXPIRATION DATE: 201805242359
ISSUE DATE / TIME: 201804251431
UNIT TYPE AND RH: 6200
UNIT TYPE AND RH: 6200
Unit Type and Rh: 6200
Unit Type and Rh: 6200

## 2017-01-19 LAB — COMPREHENSIVE METABOLIC PANEL
ALT: 16 U/L (ref 14–54)
ANION GAP: 7 (ref 5–15)
AST: 135 U/L — ABNORMAL HIGH (ref 15–41)
Albumin: 2.3 g/dL — ABNORMAL LOW (ref 3.5–5.0)
Alkaline Phosphatase: 139 U/L — ABNORMAL HIGH (ref 38–126)
BUN: 6 mg/dL (ref 6–20)
CALCIUM: 8.1 mg/dL — AB (ref 8.9–10.3)
CHLORIDE: 102 mmol/L (ref 101–111)
CO2: 31 mmol/L (ref 22–32)
Creatinine, Ser: 0.4 mg/dL — ABNORMAL LOW (ref 0.44–1.00)
GFR calc non Af Amer: >60 mL/min (ref >60)
Glucose, Bld: 93 mg/dL (ref 65–99)
POTASSIUM: 4 mmol/L (ref 3.5–5.1)
SODIUM: 140 mmol/L (ref 135–145)
Total Bilirubin: 12.2 mg/dL — ABNORMAL HIGH (ref 0.3–1.2)
Total Protein: 7 g/dL (ref 6.5–8.1)

## 2017-01-19 LAB — PHOSPHORUS: Phosphorus: 2.9 mg/dL (ref 2.5–4.6)

## 2017-01-19 LAB — RETICULOCYTES
RBC.: 2.09 MIL/uL — AB (ref 3.87–5.11)
RETIC COUNT ABSOLUTE: 434.7 10*3/uL — AB (ref 19.0–186.0)
Retic Ct Pct: 20.8 % — ABNORMAL HIGH (ref 0.4–3.1)

## 2017-01-19 LAB — LACTATE DEHYDROGENASE: LDH: 399 U/L — AB (ref 98–192)

## 2017-01-19 LAB — MAGNESIUM: MAGNESIUM: 1.8 mg/dL (ref 1.7–2.4)

## 2017-01-19 MED ORDER — HYDROMORPHONE HCL 1 MG/ML IJ SOLN: 2.0000 mg | INTRAMUSCULAR | Status: DC | PRN

## 2017-01-19 MED ORDER — POTASSIUM CHLORIDE CRYS ER 20 MEQ PO TBCR
40.0000 meq | EXTENDED_RELEASE_TABLET | Freq: Two times a day (BID) | ORAL | Status: DC
  Administered 2017-01-19: 40 meq via ORAL
  Filled 2017-01-19: qty 2

## 2017-01-19 MED ORDER — HYDROMORPHONE HCL 1 MG/ML IJ SOLN
2.0000 mg | INTRAMUSCULAR | Status: DC
  Administered 2017-01-19 – 2017-01-20 (×7): 2 mg via INTRAVENOUS
  Filled 2017-01-19 (×9): qty 2

## 2017-01-19 MED ORDER — FUROSEMIDE 10 MG/ML IJ SOLN
40.0000 mg | Freq: Once | INTRAMUSCULAR | Status: AC
  Administered 2017-01-19: 40 mg via INTRAVENOUS
  Filled 2017-01-19: qty 4

## 2017-01-19 MED ORDER — FUROSEMIDE 10 MG/ML IJ SOLN
60.0000 mg | Freq: Every day | INTRAMUSCULAR | Status: DC
  Administered 2017-01-19: 60 mg via INTRAVENOUS
  Filled 2017-01-19: qty 6

## 2017-01-19 NOTE — Progress Notes (Signed)
PROGRESS NOTE    Dawn [name]  XTG:[id number] DOB: 1981/03/18 DOA: 01/02/2017 PCP: [name], FNP   Brief Narrative:  36 year old female with history of sickle cell anemia, presented with sickle cell crisis. She's had 13 admissions in the past 6 months and 27 ED visits in the last 6 months. She was admitted for sickle cell anemia pain crisis. She is currently still on a Dilaudid PCA and was transfused 4 units of PRBCs for worsening anemia. Hospital course complicated by development of fever and evidence of staph aureus bacteremia. Port-A-Cath that was placed earlier this month has been removed. Further workup including TEE did not show any vegetations. PICC line was placed for long-term antibiotics. Another barrier to discharge was that patient remained on Dilaudid PCA. Discussed with Dr. Liston Alba of Sickle Cell and patient was scheduled Oxycodone and restarted on Hydroxeyurea. Patient came off the PCA today and scheduled 2 mg of IV Dilaudid q2h while awake in addition to scheduled MS Contin and Oxycodone IR. Hb/Hct improving but patient still markedly volume overloaded so will increase today's diuresis.   Assessment & Plan:   Principal Problem:   Sickle cell crisis (HCC) Active Problems:   Sickle cell disease (HCC)   Elevated LFTs   Hyperbilirubinemia   Thrombocytopenia (HCC)   Chronic pain syndrome   Anemia of chronic disease   Bacteremia due to Staphylococcus aureus  1. Acute Sickle Cell Pain Crisis. -Continues to have uncontrolled pain, required Dilaudid PCA. Weaned down Dilaudid PCA and titrated off. Patient is now on Dilaudid 2 mg IV q2h while away -She is continued on her long-acting narcotics with Oxycodone 10 mg po q4hprn for Moderate Pain -C/w MS Contin to 30 mg po q12h.  -Upon discussion with the patient she was taking Oxycodone 10 mg po q4hprn only as needed as an outpatient and started taking it scheduled when she was going into crisis.  -Changed 10 mg of  Oxycodone q6h Scheduled to q4h to attempt to wean off PCA -Pain remains around a 7-8 -Discussed Case with Dr. Liston Alba who recommended starting back on 1000 mg of Hydroxyurea and recommended against transfusion at this point.  -Continue to Ambulate  2. Sickle Cell Anemia, improving -She did receive 2 units of PRBCs and 4/5 with improvement of hemoglobin. She received another 2 units on 4/19 in preparation for surgery for Removal of Port-A-Cath.  -Hb/Hct went from 5.9/17.1 -> 5.7/16.2 -> 7.2/20.3 -LDH was 399; Reticulocyte Count Percentage was >20.8 and Retic Count Manually was 434.7 -There is no obvious bleeding.  -This is likely related to her sickle crisis.  -Patient did not get 2 units of Blood that were ordered for her 01/16/17; Infusion was stopped prior to any blood being transfused -C/w 1000 mg of Hydroxyurea at this time based on Dr. Zigmund Daniel Recommendation to increase HbF -Continue to monitor and repeat in AM with Reticulocyte Count  3. Staph Aureus Bacteremia.  [facility] course has been complicated by development of fever.  -Chest x-ray did not show any significant findings.  -Blood cultures returned positive for MSSA bacteremia. -Discussed with Infectious Disease who recommended removal of Port-A-Cath which was completed on 4/20.  -Catheter tip culture also positive for staph aureus. -Repeat blood cultures drawn on 4/19 have shown no growth. -TEE did not show any evidence of vegetations and MRI L spine did not show infection/discitis.  -Since this is a recurrent bacteremia (last episode was in 10/2016), she will need 4 weeks of IV antibiotics to be completed on  5/17.   -She is currently on IV Cefazolin 2 gram q8h -Once antibiotics are complete, she should have repeat blood cultures drawn on 5/22 to ensure that bacteremia has cleared, prior to any further port placement.   4. Thrombocytopenia. Improved -Possibly related to crisis. Improved as Plts went from 86 ->  439 -Repeat CBC in AM  5. Chronic Hyperbilirubinemia -T Bili went from 16.7 -> 13.7 -> 11.8 -> 9.7 -> 12.2 -Likely secondary to hemolysis from Sickle Cell.  -No abdominal pain, nausea or vomiting. Continue to monitor. -Repeat CMP in AM  6. Lower Extremity Edema/Anasarca  -Per Record Patient was +13.7 Liters and has gained 28 lbs since admission; Now she is +3.9  Liters and 179 (down 17 pounds) -Likely related to aggressive hydration and hypoalbuminemia.  -Fluids have been discontinued.  -Checked Venous Ultrasound for DVT as Right leg is bigger than Left and showed no Evidence of DVT of Bilateral Lower Extremities -Gave IV Lasix 60 mg this AM and will give IV 40 mg this Evening -Ordered Compression stockings for patient  -Continue to monitor Strict I's and O's and Daily Weights and re-evaluate need for continued IV Lasix in AM  7. AKI, Improved -BUN/Cr went from 28/1.40 (on 01/13/17) -> 7/0.46 -> 5/0.43 -> 6/0.40 -Possibly related to Toradol and lasix.  -Toradol has been discontinued. Renal function has since improved. Creatinine currently stable -Repeat CMP in AM  8. Hypoalbuminemia.  -Albumin Level was 2.3 this AM -No proteinuria on urinalysis. Likely related to poor po intake.  -Nutrition consult -C/w Feeding Supplement 30 mL po BID  9. Hypokalemia, improved -Patient's K+ went from 2.5 -> 4.0 this AM -Replete with Potassium Chloride 40 mEQ po BID -Continue to Monitor and Repeat CMP in AM  10. Hypomagnesemia, improved -Mag Level was 1.8 -Replete with IV 4 grams of Mag Sulfate yesterday -C/w po Mag Oxide 400 mg po BID at the recommendation of Dr. Zigmund Daniel -Repeat Mag Level on Sunday  11. Abnormal LFT's/Elevated AST -AST this AM was 135 and ALT was 16; Slightly higher than the last few readings  -Continue to Trend  12. Leukocytosis,  -Likely reactive to pain and Sickle Cell  -WBC went from 17.9 -> 15.4 -> 12.9 -> 10.4 -> 11.6 -On Abx with Cefazolin 2 grams IV  q8h -Repeat CBC on Sunday and continue to Monitor  DVT prophylaxis: SCDs Code Status: FULL CODE Family Communication: No Family present at bedside Disposition Plan: Home when medically stable for D/C  Consultants:   Cardiology for TEE  General Surgery for Port-A-Cath Removal  Infectious Diseases via Phone Consultation   Dr. Liston Alba of Sickle Cell   Procedures:  2D echo: - Left ventricle: The cavity size was normal. Wall thickness was normal. Systolic function was normal. The estimated ejection fraction was in the range of 60% to 65%. Wall motion was normal; there were no regional wall motion abnormalities. The study is not technically sufficient to allow evaluation of LV diastolic function. - Aortic valve: Valve area (VTI): 2.19 cm^2. Valve area (Vmax): 2.23 cm^2. Valve area (Vmean): 2.14 cm^2. - Left atrium: The atrium was moderately dilated. - Right atrium: The atrium was mildly dilated. - Pulmonary arteries: Systolic pressure was moderately increased. PA peak pressure: 37 mm Hg (S). - Pericardium, extracardiac: There is a small circumferential pericardial effusion.  - Technically adequate study.   TEE: No evidence of endocarditis. There was no evidence of a  vegetation.    Removal of portacath by Dr. Arnoldo Morale on 4/20  Placement of femoral central line by Dr. Arnoldo Morale on 4/20 .   Antimicrobials:  Anti-infectives    Start     Dose/Rate Route Frequency Ordered Stop   01/09/17 2000  ceFAZolin (ANCEF) IVPB 1 g/50 mL premix - [place]  Status:  Discontinued     2 g 200 mL/hr over 30 Minutes Intravenous Every 8 hours 01/09/17 1156 01/09/17 1157   01/09/17 2000  ceFAZolin (ANCEF) 2 g in dextrose 5 % 100 mL IVPB     2 g 200 mL/hr over 30 Minutes Intravenous Every 8 hours 01/09/17 1157     01/09/17 1800  vancomycin (VANCOCIN) IVPB 750 mg/150 ml premix  Status:  Discontinued     750 mg 150 mL/hr over 60 Minutes Intravenous Every 8 hours  01/09/17 0924 01/09/17 1156   01/09/17 1000  vancomycin (VANCOCIN) 1,500 mg in sodium chloride 0.9 % 500 mL IVPB     1,500 mg 250 mL/hr over 120 Minutes Intravenous  Once 01/09/17 0924 01/09/17 1236   01/03/17 1300  metroNIDAZOLE (FLAGYL) tablet 500 mg  Status:  Discontinued     500 mg Oral 2 times daily 01/03/17 1255 01/12/17 1225     Subjective: Seen and examined and still stated that pain was around a 7-8. States she has been ambulating. Also states she feels her legs are more tight today. No nausea or vomiting. No other concerns or complaints.   Objective: Vitals:   01/19/17 0000 01/19/17 0500 01/19/17 0800 01/19/17 1327  BP:  (!) 128/97  117/71  Pulse:  (!) 109  (!) 106  Resp: 14 18 17 18   Temp:  99.1 F (37.3 C)  99.8 F (37.7 C)  TempSrc:  Oral  Oral  SpO2: 97% 95% 99% 93%  Weight:  81.6 kg (179 lb 12.8 oz)    Height:        Intake/Output Summary (Last 24 hours) at 01/19/17 1906 Last data filed at 01/19/17 1800  Gross per 24 hour  Intake          2545.83 ml  Output             1000 ml  Net          1545.83 ml   Filed Weights   01/17/17 0408 01/18/17 0500 01/19/17 0500  Weight: 83 kg (183 lb) 82.6 kg (182 lb) 81.6 kg (179 lb 12.8 oz)   Examination: Physical Exam:  Constitutional: WN/WD obese AAF, NAD and appears calm sitting at the edge of bed Eyes: Lids and conjunctivae normal,  ENMT: External Ears, Nose appear normal. Grossly normal hearing.  Neck: Appears normal, supple, no cervical masses, normal ROM, no appreciable thyromegaly, no visible JVD Respiratory: Diminished to auscultation bilaterally, no wheezing, rales, rhonchi or crackles. Normal respiratory effort and patient is not tachypenic. No accessory muscle use. Patient wearing supplemental O2 via [place].   Cardiovascular: Slightly tachycardic and loud S1 S2, no appreciable murmurs / rubs / gallops. 2+ Lower extremity pitting edema with Right leg worse than Left.  Abdomen: Soft, non-tender, non-distended. No  masses palpated. No appreciable hepatosplenomegaly. Bowel sounds positive x4.  GU: Deferred. Musculoskeletal: No clubbing / cyanosis of digits/nails. No joint deformity upper and lower extremities.  Skin: No rashes, lesions, ulcers on limited skin evaluation. No induration; Warm and dry.  Neurologic: CN 2-12 grossly intact with no focal deficits.  Romberg sign cerebellar reflexes not assessed.  Psychiatric: Normal judgment and insight. Alert and oriented x 3. Frustrated mood and appropriate affect.   Data  Reviewed: I have personally reviewed following labs and imaging studies  CBC:  Recent Labs Lab 01/15/17 0445 01/16/17 0522 01/17/17 0538 01/18/17 0614 01/19/17 1155  WBC 17.9* 15.4* 12.9* 10.4 11.6*  NEUTROABS  --   --  7.2 5.9 7.5  HGB 5.3* 5.4* 5.9* 5.7* 7.2*  HCT 15.3* 15.4* 17.1* 16.2* 20.3*  MCV 92.7 94.5 95.0 95.9 97.1  PLT 260 351 417* 429* 323*   Basic Metabolic Panel:  Recent Labs Lab 01/15/17 0445 01/16/17 0522 01/17/17 0538 01/18/17 0614 01/19/17 1155  NA 139 138 137 137 140  K 3.6 3.0* 3.1* 2.5* 4.0  CL 110 105 100* 105 102  CO2 26 27 30 26 31   GLUCOSE 90 94 89 109* 93  BUN 20 11 7  5* 6  CREATININE 0.61 0.47 0.46 0.43* 0.40*  CALCIUM 7.9* 7.9* 7.8* 6.9* 8.1*  MG  --  1.3* 1.8 1.4* 1.8  PHOS  --   --  3.3 2.5 2.9   GFR: Estimated Creatinine Clearance: 98.4 mL/min (A) (by C-G formula based on SCr of 0.4 mg/dL (L)). Liver Function Tests:  Recent Labs Lab 01/13/17 0610 01/16/17 0522 01/17/17 0538 01/18/17 0614 01/19/17 1155  AST 101* 104* 109* 88* 135*  ALT 11* 8* 10* 10* 16  ALKPHOS 121 125 133* 119 139*  BILITOT 16.7* 13.7* 11.8* 9.7* 12.2*  PROT 5.3* 5.8* 6.1* 5.4* 7.0  ALBUMIN 1.5* 2.1* 2.1* 1.8* 2.3*   No results for input(s): LIPASE, AMYLASE in the last 168 hours. No results for input(s): AMMONIA in the last 168 hours. Coagulation Profile: No results for input(s): INR, PROTIME in the last 168 hours. Cardiac Enzymes: No results for  input(s): CKTOTAL, CKMB, CKMBINDEX, TROPONINI in the last 168 hours. BNP (last 3 results) No results for input(s): PROBNP in the last 8760 hours. HbA1C: No results for input(s): HGBA1C in the last 72 hours. CBG: No results for input(s): GLUCAP in the last 168 hours. Lipid Profile: No results for input(s): CHOL, HDL, LDLCALC, TRIG, CHOLHDL, LDLDIRECT in the last 72 hours. Thyroid Function Tests: No results for input(s): TSH, T4TOTAL, FREET4, T3FREE, THYROIDAB in the last 72 hours. Anemia Panel:  Recent Labs  01/18/17 0614 01/19/17 1155  RETICCTPCT >23.0* 20.8*   Sepsis Labs: No results for input(s): PROCALCITON, LATICACIDVEN in the last 168 hours.  Recent Results (from the past 240 hour(s))  Culture, blood (routine x 2)     Status: None   Collection Time: 01/10/17  3:53 PM  Result Value Ref Range Status   Specimen Description BLOOD RIGHT FOREARM  Final   Special Requests   Final    BOTTLES DRAWN AEROBIC AND ANAEROBIC Blood Culture adequate volume   Culture NO GROWTH 5 DAYS  Final   Report Status 01/15/2017 FINAL  Final  Culture, blood (routine x 2)     Status: None   Collection Time: 01/10/17  3:59 PM  Result Value Ref Range Status   Specimen Description BLOOD LEFT HAND  Final   Special Requests   Final    BOTTLES DRAWN AEROBIC AND ANAEROBIC Blood Culture adequate volume   Culture NO GROWTH 5 DAYS  Final   Report Status 01/15/2017 FINAL  Final  Surgical pcr screen     Status: Abnormal   Collection Time: 01/11/17 12:46 AM  Result Value Ref Range Status   MRSA, PCR NEGATIVE NEGATIVE Final   Staphylococcus aureus POSITIVE (A) NEGATIVE Final    Comment:        The Xpert SA Assay (FDA  approved for NASAL specimens in patients over 48 years of age), is one component of a comprehensive surveillance program.  Test performance has been validated by [facility] for patients greater than or equal to 31 year old. It is not intended to diagnose infection nor to guide or  monitor treatment. RESULT CALLED TO, READ BACK BY AND VERIFIED WITH: [facility] AT 0557 BY HUFFINES,S ON 01/11/17.   Cath Tip Culture     Status: Abnormal   Collection Time: 01/11/17  2:33 PM  Result Value Ref Range Status   Specimen Description PORTA CATH  Final   Special Requests   Final    CATH TIP SENT IN SWAB Performed at [facility], [place] [address], [place], [place] [address]    Culture <10,000 COLONIES/mL STAPHYLOCOCCUS AUREUS (A)  Final   Report Status 01/14/2017 FINAL  Final    Radiology Studies: US Venous Img Lower Bilateral  Result Date: 01/19/2017 CLINICAL DATA:  Swollen legs for 3 days EXAM: BILATERAL LOWER EXTREMITY VENOUS DOPPLER ULTRASOUND TECHNIQUE: Gray-scale sonography with graded compression, as well as color Doppler and duplex ultrasound were performed to evaluate the lower extremity deep venous systems from the level of the common femoral vein and including the common femoral, femoral, profunda femoral, popliteal and calf veins including the posterior tibial, peroneal and gastrocnemius veins when visible. The superficial great saphenous vein was also interrogated. Spectral Doppler was utilized to evaluate flow at rest and with distal augmentation maneuvers in the common femoral, femoral and popliteal veins. COMPARISON:  None. FINDINGS: RIGHT LOWER EXTREMITY Common Femoral Vein: No evidence of thrombus. Normal compressibility, respiratory phasicity and response to augmentation. Saphenofemoral Junction: No evidence of thrombus. Normal compressibility and flow on color Doppler imaging. Profunda Femoral Vein: No evidence of thrombus. Normal compressibility and flow on color Doppler imaging. Femoral Vein: No evidence of thrombus. Normal compressibility, respiratory phasicity and response to augmentation. Popliteal Vein: No evidence of thrombus. Normal compressibility, respiratory phasicity and response to augmentation. Calf Veins: No evidence of thrombus. Normal  compressibility and flow on color Doppler imaging. Superficial Great Saphenous Vein: No evidence of thrombus. Normal compressibility and flow on color Doppler imaging. Venous Reflux:  None. Other Findings:  None. LEFT LOWER EXTREMITY Common Femoral Vein: No evidence of thrombus. Normal compressibility, respiratory phasicity and response to augmentation. Saphenofemoral Junction: No evidence of thrombus. Normal compressibility and flow on color Doppler imaging. Profunda Femoral Vein: No evidence of thrombus. Normal compressibility and flow on color Doppler imaging. Femoral Vein: No evidence of thrombus. Normal compressibility, respiratory phasicity and response to augmentation. Popliteal Vein: No evidence of thrombus. Normal compressibility, respiratory phasicity and response to augmentation. Calf Veins: No evidence of thrombus. Normal compressibility and flow on color Doppler imaging. Superficial Great Saphenous Vein: No evidence of thrombus. Normal compressibility and flow on color Doppler imaging. Venous Reflux:  None. Other Findings:  None. IMPRESSION: No evidence of deep venous thrombosis of bilateral lower extremities. Electronically Signed   By: Kathreen Devoid   On: 01/19/2017 11:14     Scheduled Meds: . docusate sodium  100 mg Oral BID  . feeding supplement (PRO-STAT SUGAR FREE 64)  30 mL Oral BID  . folic acid  1 mg Oral Daily  . furosemide  40 mg Intravenous Once  . furosemide  60 mg Intravenous Daily  . gabapentin  100 mg Oral TID  .  HYDROmorphone (DILAUDID) injection  2 mg Intravenous Q2H while awake  . hydroxyurea  1,000 mg Oral Daily  . L-glutamine  5 g Oral BID  . loratadine  10 mg Oral Daily  . magnesium oxide  400 mg Oral BID  . morphine  30 mg Oral Q12H  . oxyCODONE  10 mg Oral Q4H  . polyethylene glycol  17 g Oral Daily  . potassium chloride  40 mEq Oral BID  . senna-docusate  1 tablet Oral BID   Continuous Infusions: . sodium chloride 250 mL (01/17/17 2044)  . sodium chloride  10 mL/hr at 01/09/17 1543  . ceFAZolin (ANCEF) IVPB (doses >1 g) Stopped (01/19/17 1230)    LOS: 17 days   Kerney Elbe, DO [facility] Pager [phone]  If 7PM-7AM, please contact night-coverage [email or web address] Password [facility] 01/19/2017, 7:06 PM

## 2017-01-19 NOTE — Progress Notes (Signed)
Patient has been anxious and nervous through the night about plan of care.  Patient feels as if she is slowly getting better, but very concerned about status of her lower extremities.  Patient still has a large amount of edema in right lower leg, which concerns patient and is distressing to patient.  Patient expresses concerns about transfer, she feels as if she needs a higher level of care.  Will pass on to daytime nurse patient concerns, and supervisor spoke with patient about concerns.

## 2017-01-20 DIAGNOSIS — D473 Essential (hemorrhagic) thrombocythemia: Secondary | ICD-10-CM

## 2017-01-20 LAB — COMPREHENSIVE METABOLIC PANEL
ALBUMIN: 2 g/dL — AB (ref 3.5–5.0)
ALT: 12 U/L — ABNORMAL LOW (ref 14–54)
ANION GAP: 6 (ref 5–15)
AST: 96 U/L — AB (ref 15–41)
Alkaline Phosphatase: 128 U/L — ABNORMAL HIGH (ref 38–126)
BUN: 8 mg/dL (ref 6–20)
CO2: 31 mmol/L (ref 22–32)
Calcium: 7.7 mg/dL — ABNORMAL LOW (ref 8.9–10.3)
Chloride: 100 mmol/L — ABNORMAL LOW (ref 101–111)
Creatinine, Ser: 0.63 mg/dL (ref 0.44–1.00)
GFR calc Af Amer: >60 mL/min (ref >60)
GFR calc non Af Amer: >60 mL/min (ref >60)
GLUCOSE: 91 mg/dL (ref 65–99)
POTASSIUM: 2.8 mmol/L — AB (ref 3.5–5.1)
SODIUM: 137 mmol/L (ref 135–145)
TOTAL PROTEIN: 6.2 g/dL — AB (ref 6.5–8.1)
Total Bilirubin: 10.4 mg/dL — ABNORMAL HIGH (ref 0.3–1.2)

## 2017-01-20 LAB — CBC WITH DIFFERENTIAL/PLATELET
BASOS ABS: 0 10*3/uL (ref 0.0–0.1)
BASOS PCT: 0 %
Band Neutrophils: 0 %
Blasts: 0 %
EOS ABS: 0.6 10*3/uL (ref 0.0–0.7)
Eosinophils Relative: 5 %
HCT: 18.1 % — ABNORMAL LOW (ref 36.0–46.0)
HEMOGLOBIN: 6.2 g/dL — AB (ref 12.0–15.0)
Lymphocytes Relative: 19 %
Lymphs Abs: 2.1 10*3/uL (ref 0.7–4.0)
MCH: 33.3 pg (ref 26.0–34.0)
MCHC: 34.3 g/dL (ref 30.0–36.0)
MCV: 97.3 fL (ref 78.0–100.0)
MONO ABS: 0.9 10*3/uL (ref 0.1–1.0)
MYELOCYTES: 0 %
Metamyelocytes Relative: 0 %
Monocytes Relative: 8 %
NEUTROS PCT: 68 %
NRBC: 0 /100{WBCs}
Neutro Abs: 7.5 10*3/uL (ref 1.7–7.7)
PLATELETS: 491 10*3/uL — AB (ref 150–400)
Promyelocytes Absolute: 0 %
RBC: 1.86 MIL/uL — ABNORMAL LOW (ref 3.87–5.11)
RDW: 21.5 % — ABNORMAL HIGH (ref 11.5–15.5)
WBC: 11.1 10*3/uL — ABNORMAL HIGH (ref 4.0–10.5)

## 2017-01-20 LAB — RETICULOCYTES
RBC.: 1.86 MIL/uL — AB (ref 3.87–5.11)
RETIC COUNT ABSOLUTE: 403.6 10*3/uL — AB (ref 19.0–186.0)
RETIC CT PCT: 21.7 % — AB (ref 0.4–3.1)

## 2017-01-20 LAB — MAGNESIUM: Magnesium: 1.7 mg/dL (ref 1.7–2.4)

## 2017-01-20 LAB — PHOSPHORUS: Phosphorus: 3.9 mg/dL (ref 2.5–4.6)

## 2017-01-20 LAB — LACTATE DEHYDROGENASE: LDH: 253 U/L — AB (ref 98–192)

## 2017-01-20 MED ORDER — HYDROMORPHONE HCL 1 MG/ML IJ SOLN
2.0000 mg | INTRAMUSCULAR | Status: DC
  Administered 2017-01-20 – 2017-01-21 (×13): 2 mg via INTRAVENOUS
  Filled 2017-01-20 (×14): qty 2

## 2017-01-20 MED ORDER — FUROSEMIDE 10 MG/ML IJ SOLN
40.0000 mg | Freq: Every day | INTRAMUSCULAR | Status: DC
  Administered 2017-01-20: 40 mg via INTRAVENOUS
  Filled 2017-01-20 (×2): qty 4

## 2017-01-20 MED ORDER — POTASSIUM CHLORIDE CRYS ER 20 MEQ PO TBCR
40.0000 meq | EXTENDED_RELEASE_TABLET | Freq: Three times a day (TID) | ORAL | Status: DC
  Administered 2017-01-20 – 2017-01-25 (×16): 40 meq via ORAL
  Filled 2017-01-20 (×16): qty 2

## 2017-01-20 MED ORDER — SODIUM CHLORIDE 0.9 % IV SOLN: 30.0000 meq | Freq: Once | INTRAVENOUS | Status: DC

## 2017-01-20 MED ORDER — HYDROMORPHONE HCL 1 MG/ML IJ SOLN
2.0000 mg | Freq: Once | INTRAMUSCULAR | Status: AC
  Administered 2017-01-20: 2 mg via INTRAVENOUS

## 2017-01-20 MED ORDER — MAGNESIUM SULFATE 2 GM/50ML IV SOLN
2.0000 g | Freq: Once | INTRAVENOUS | Status: AC
  Administered 2017-01-20: 2 g via INTRAVENOUS
  Filled 2017-01-20: qty 50

## 2017-01-20 MED ORDER — POTASSIUM CHLORIDE 10 MEQ/100ML IV SOLN
10.0000 meq | INTRAVENOUS | Status: AC
  Administered 2017-01-20 (×3): 10 meq via INTRAVENOUS
  Filled 2017-01-20 (×2): qty 100

## 2017-01-20 MED ORDER — FUROSEMIDE 10 MG/ML IJ SOLN
40.0000 mg | Freq: Once | INTRAMUSCULAR | Status: AC
  Administered 2017-01-20: 40 mg via INTRAVENOUS
  Filled 2017-01-20: qty 4

## 2017-01-20 NOTE — Progress Notes (Signed)
CRITICAL VALUE ALERT  Critical value received:  Potassium 2.8  Date of notification: 01/20/2017  Time of notification:  0801  Critical value read back::Yes  Nurse who received alert:  Vista Deck  MD notified (1st page): Alfredia Ferguson, O  Time of first page:  [phone]

## 2017-01-20 NOTE — Progress Notes (Signed)
PROGRESS NOTE    Dawn [name]  NLG:[id number] DOB: Jun 29, 1981 DOA: 01/02/2017 PCP: [name], FNP   Brief Narrative:  36 year old female with history of sickle cell anemia, presented with sickle cell crisis. She's had 13 admissions in the past 6 months and 27 ED visits in the last 6 months. She was admitted for sickle cell anemia pain crisis. She was on Dilaudid PCA and was transfused 4 units of PRBCs for worsening anemia. Hospital course complicated by development of fever and evidence of staph aureus bacteremia. Port-A-Cath that was placed earlier this month has been removed. Further workup including TEE did not show any vegetations. PICC line was placed for long-term antibiotics. Discussed with Dr. Liston Alba of Sickle Cell and patient was scheduled Oxycodone and restarted on Hydroxeyurea. Patient came off the PCA 01/19/17 and scheduled 2 mg of IV Dilaudid q2h while awake in addition to scheduled MS Contin and Oxycodone IR. IV Dilaudid was changed to 2 mg IV q1h while awake and patient is still being diuresed and improving slowly.   Assessment & Plan:   Principal Problem:   Sickle cell crisis (HCC) Active Problems:   Sickle cell disease (HCC)   Elevated LFTs   Hyperbilirubinemia   Thrombocytopenia (HCC)   Chronic pain syndrome   Anemia of chronic disease   Bacteremia due to Staphylococcus aureus  1. Acute Sickle Cell Pain Crisis. -Continues to have uncontrolled pain ranging from 7-8/10, required Dilaudid PCA. Weaned down Dilaudid PCA and titrated off.  -Patient was on Dilaudid 2 mg IV q2h while awake and increased to q1h while Awake -She is continued on her long-acting narcotics with Oxycodone 10 mg po q4hprn for Moderate Pain -C/w MS Contin to 30 mg po q12h.  -Upon discussion with the patient she was taking Oxycodone 10 mg po q4hprn only as needed as an outpatient and started taking it scheduled when she was going into crisis.  -C/w 10 mg of Oxycodone q4h -Pain remains  around a 7-8 -Discussed Case with Dr. Liston Alba who recommended starting back on 1000 mg of Hydroxyurea and recommended against transfusion at this point.  -Continue to Ambulate  2. Sickle Cell Anemia, improving -She did receive 2 units of PRBCs and 4/5 with improvement of hemoglobin. She received another 2 units on 4/19 in preparation for surgery for Removal of Port-A-Cath.  -Hb/Hct went from 5.9/17.1 -> 5.7/16.2 -> 7.2/20.3 -> 6.2/18.1 -LDH was 253; Reticulocyte Count Percentage was 21.7 and Retic Count Manually was 403.6 -There is no obvious bleeding.  -This is likely related to her sickle crisis.  -Patient did not get 2 units of Blood that were ordered for her 01/16/17; Infusion was stopped prior to any blood being transfused -C/w 1000 mg of Hydroxyurea at this time based on Dr. Zigmund Daniel Recommendation to increase HbF -Continue to monitor and repeat in AM with Reticulocyte Count  3. Staph Aureus Bacteremia.  [facility] course has been complicated by development of fever.  -Chest x-ray did not show any significant findings.  -Blood cultures returned positive for MSSA bacteremia. -Discussed with Infectious Disease who recommended removal of Port-A-Cath which was completed on 4/20.  -Catheter tip culture also positive for staph aureus. -Repeat blood cultures drawn on 4/19 have shown no growth.  -TEE did not show any evidence of vegetations and MRI L spine did not show infection/discitis.  -Since this is a recurrent bacteremia (last episode was in 10/2016), she will need 4 weeks of IV antibiotics to be completed on 5/17.   -She  is currently on IV Cefazolin 2 gram q8h and will continue -Once antibiotics are complete, she should have repeat blood cultures drawn on 5/22 to ensure that bacteremia has cleared, prior to any further port placement.   4. Thrombocytopenia and now Thrombocytosis -Possibly related to crisis. Improved as Plts went from 86 -> 491 -Repeat CBC in AM  5.  Chronic Hyperbilirubinemia -T Bili went from 16.7 -> 13.7 -> 11.8 -> 9.7 -> 12.2 -> 10.4 -Likely secondary to hemolysis from Sickle Cell.  -No abdominal pain, nausea or vomiting. Continue to monitor. -Repeat CMP in AM  6. Lower Extremity Edema/Anasarca, improving -Per Record Patient was +13.7 Liters and has gained 28 lbs since admission; Now she is +2.3  Liters and 173 (down 23 pounds) -Likely related to aggressive hydration and hypoalbuminemia.  -Fluids have been discontinued but continues to receive Abx with Piggybacks.  -Checked Venous Ultrasound for DVT as Right leg is bigger than Left and showed no Evidence of DVT of Bilateral Lower Extremities -Gave IV Lasix 40 mg this AM and will give IV 40 mg this Evening -C/w Compression stockings for patient  -Continue to monitor Strict I's and O's and Daily Weights and re-evaluate need for continued IV Lasix in AM  7. AKI, Improved -BUN/Cr went from 28/1.40 (on 01/13/17) -> 7/0.46 -> 5/0.43 -> 6/0.40 -> 8/0.63 -Possibly related to Toradol and lasix.  -Toradol has been discontinued. Renal function has since improved. Creatinine currently stable -Repeat CMP in AM  8. Hypoalbuminemia.  -Albumin Level was 2.0 this AM -No proteinuria on urinalysis. Likely related to poor po intake and Sickle Cell -Nutrition consult -C/w Prostat Feeding Supplement 30 mL po BID  9. Hypokalemia -Patient's K+ was 2.8 this AM -Replete with Potassium Chloride 40 mEQ po TID and with IV KCl 30 mEQ -Continue to Monitor and Repeat CMP in AM  10. Hypomagnesemia -Mag Level was 1.7 -Replete with IV 2 grams of Mag Sulfate  -C/w po Mag Oxide 400 mg po BID at the recommendation of Dr. Zigmund Daniel -Repeat Mag Level in AM  11. Abnormal LFT's/Elevated AST -AST this AM was 96 (135) and ALT was 12 (16); -Continue to Trend  12. Leukocytosis,  -Likely reactive to pain and Sickle Cell  -WBC went from 17.9 -> 15.4 -> 12.9 -> 10.4 -> 11.6 -> 11.1 -On Abx with Cefazolin 2  grams IV q8h -Repeat CBC on Sunday and continue to Monitor  DVT prophylaxis: SCDs Code Status: FULL CODE Family Communication: No Family present at bedside Disposition Plan: Home when medically stable for D/C  Consultants:   Cardiology for TEE  General Surgery for Port-A-Cath Removal  Infectious Diseases via Phone Consultation   Dr. Liston Alba of Sickle Cell   Procedures:  2D echo: - Left ventricle: The cavity size was normal. Wall thickness was normal. Systolic function was normal. The estimated ejection fraction was in the range of 60% to 65%. Wall motion was normal; there were no regional wall motion abnormalities. The study is not technically sufficient to allow evaluation of LV diastolic function. - Aortic valve: Valve area (VTI): 2.19 cm^2. Valve area (Vmax): 2.23 cm^2. Valve area (Vmean): 2.14 cm^2. - Left atrium: The atrium was moderately dilated. - Right atrium: The atrium was mildly dilated. - Pulmonary arteries: Systolic pressure was moderately increased. PA peak pressure: 37 mm Hg (S). - Pericardium, extracardiac: There is a small circumferential pericardial effusion.  - Technically adequate study.   TEE: No evidence of endocarditis. There was no evidence of a  vegetation.    Removal of portacath by Dr. Arnoldo Morale on 4/20  Placement of femoral central line by Dr. Arnoldo Morale on 4/20 .   Antimicrobials:  Anti-infectives    Start     Dose/Rate Route Frequency Ordered Stop   01/09/17 2000  ceFAZolin (ANCEF) IVPB 1 g/50 mL premix - [place]  Status:  Discontinued     2 g 200 mL/hr over 30 Minutes Intravenous Every 8 hours 01/09/17 1156 01/09/17 1157   01/09/17 2000  ceFAZolin (ANCEF) 2 g in dextrose 5 % 100 mL IVPB     2 g 200 mL/hr over 30 Minutes Intravenous Every 8 hours 01/09/17 1157     01/09/17 1800  vancomycin (VANCOCIN) IVPB 750 mg/150 ml premix  Status:  Discontinued     750 mg 150 mL/hr over 60 Minutes Intravenous Every 8  hours 01/09/17 0924 01/09/17 1156   01/09/17 1000  vancomycin (VANCOCIN) 1,500 mg in sodium chloride 0.9 % 500 mL IVPB     1,500 mg 250 mL/hr over 120 Minutes Intravenous  Once 01/09/17 0924 01/09/17 1236   01/03/17 1300  metroNIDAZOLE (FLAGYL) tablet 500 mg  Status:  Discontinued     500 mg Oral 2 times daily 01/03/17 1255 01/12/17 1225     Subjective: Seen and examined and still in pain and feels unchanged but states leg swelling is improving. No nausea or vomiting. States she is frustrated but hopeful. No other concerns or complaints.   Objective: Vitals:   01/19/17 0800 01/19/17 1327 01/19/17 2110 01/20/17 0621  BP:  117/71 (!) 140/93 133/85  Pulse:  (!) 106 (!) 110 95  Resp: 17 18 18 18   Temp:  99.8 F (37.7 C) (!) 101.1 F (38.4 C) 98.6 F (37 C)  TempSrc:  Oral Oral Oral  SpO2: 99% 93% 96% 97%  Weight:    78.5 kg (173 lb)  Height:        Intake/Output Summary (Last 24 hours) at 01/20/17 1020 Last data filed at 01/20/17 6568  Gross per 24 hour  Intake          2387.33 ml  Output             1201 ml  Net          1186.33 ml   Filed Weights   01/18/17 0500 01/19/17 0500 01/20/17 0621  Weight: 82.6 kg (182 lb) 81.6 kg (179 lb 12.8 oz) 78.5 kg (173 lb)   Examination: Physical Exam:  Constitutional: WN/WD obese AAF, NAD and appears calm sitting up in bed Eyes: Lids and conjunctivae normal,  ENMT: External Ears, Nose appear normal. Grossly normal hearing.  Neck: Appears normal, supple, no cervical masses, normal ROM, no appreciable thyromegaly, no visible JVD Respiratory: Diminished to auscultation bilaterally, no wheezing, rales, rhonchi or crackles. Normal respiratory effort and patient is not tachypenic. No accessory muscle use. Patient not wearing supplemental O2 via [place] this AM.   Cardiovascular: Slightly tachycardic and loud S1 S2, no appreciable murmurs / rubs / gallops. 1+ Lower extremity pitting edema with Right leg worse than Left.  Abdomen: Soft, non-tender,  non-distended. No masses palpated. No appreciable hepatosplenomegaly. Bowel sounds positive x4.  GU: Deferred. Musculoskeletal: No clubbing / cyanosis of digits/nails. No joint deformity upper and lower extremities.   Skin: No rashes, lesions, ulcers on limited skin evaluation. No induration; Warm and dry. Wearing compression stockings.  Neurologic: CN 2-12 grossly intact with no focal deficits.  Romberg sign cerebellar reflexes not assessed.  Psychiatric: Normal judgment  and insight. Alert and oriented x 3. Frustrated but has pleasant mood and appropriate affect.   Data Reviewed: I have personally reviewed following labs and imaging studies  CBC:  Recent Labs Lab 01/16/17 0522 01/17/17 0538 01/18/17 0614 01/19/17 1155 01/20/17 0629  WBC 15.4* 12.9* 10.4 11.6* 11.1*  NEUTROABS  --  7.2 5.9 7.5 7.5  HGB 5.4* 5.9* 5.7* 7.2* 6.2*  HCT 15.4* 17.1* 16.2* 20.3* 18.1*  MCV 94.5 95.0 95.9 97.1 97.3  PLT 351 417* 429* 439* 570*   Basic Metabolic Panel:  Recent Labs Lab 01/16/17 0522 01/17/17 0538 01/18/17 0614 01/19/17 1155 01/20/17 0629  NA 138 137 137 140 137  K 3.0* 3.1* 2.5* 4.0 2.8*  CL 105 100* 105 102 100*  CO2 27 30 26 31 31   GLUCOSE 94 89 109* 93 91  BUN 11 7 5* 6 8  CREATININE 0.47 0.46 0.43* 0.40* 0.63  CALCIUM 7.9* 7.8* 6.9* 8.1* 7.7*  MG 1.3* 1.8 1.4* 1.8 1.7  PHOS  --  3.3 2.5 2.9 3.9   GFR: Estimated Creatinine Clearance: 96.4 mL/min (by C-G formula based on SCr of 0.63 mg/dL). Liver Function Tests:  Recent Labs Lab 01/16/17 0522 01/17/17 0538 01/18/17 0614 01/19/17 1155 01/20/17 0629  AST 104* 109* 88* 135* 96*  ALT 8* 10* 10* 16 12*  ALKPHOS 125 133* 119 139* 128*  BILITOT 13.7* 11.8* 9.7* 12.2* 10.4*  PROT 5.8* 6.1* 5.4* 7.0 6.2*  ALBUMIN 2.1* 2.1* 1.8* 2.3* 2.0*   No results for input(s): LIPASE, AMYLASE in the last 168 hours. No results for input(s): AMMONIA in the last 168 hours. Coagulation Profile: No results for input(s): INR, PROTIME  in the last 168 hours. Cardiac Enzymes: No results for input(s): CKTOTAL, CKMB, CKMBINDEX, TROPONINI in the last 168 hours. BNP (last 3 results) No results for input(s): PROBNP in the last 8760 hours. HbA1C: No results for input(s): HGBA1C in the last 72 hours. CBG: No results for input(s): GLUCAP in the last 168 hours. Lipid Profile: No results for input(s): CHOL, HDL, LDLCALC, TRIG, CHOLHDL, LDLDIRECT in the last 72 hours. Thyroid Function Tests: No results for input(s): TSH, T4TOTAL, FREET4, T3FREE, THYROIDAB in the last 72 hours. Anemia Panel:  Recent Labs  01/19/17 1155 01/20/17 0629  RETICCTPCT 20.8* 21.7*   Sepsis Labs: No results for input(s): PROCALCITON, LATICACIDVEN in the last 168 hours.  Recent Results (from the past 240 hour(s))  Culture, blood (routine x 2)     Status: None   Collection Time: 01/10/17  3:53 PM  Result Value Ref Range Status   Specimen Description BLOOD RIGHT FOREARM  Final   Special Requests   Final    BOTTLES DRAWN AEROBIC AND ANAEROBIC Blood Culture adequate volume   Culture NO GROWTH 5 DAYS  Final   Report Status 01/15/2017 FINAL  Final  Culture, blood (routine x 2)     Status: None   Collection Time: 01/10/17  3:59 PM  Result Value Ref Range Status   Specimen Description BLOOD LEFT HAND  Final   Special Requests   Final    BOTTLES DRAWN AEROBIC AND ANAEROBIC Blood Culture adequate volume   Culture NO GROWTH 5 DAYS  Final   Report Status 01/15/2017 FINAL  Final  Surgical pcr screen     Status: Abnormal   Collection Time: 01/11/17 12:46 AM  Result Value Ref Range Status   MRSA, PCR NEGATIVE NEGATIVE Final   Staphylococcus aureus POSITIVE (A) NEGATIVE Final    Comment:  The Xpert SA Assay (FDA approved for NASAL specimens in patients over 18 years of age), is one component of a comprehensive surveillance program.  Test performance has been validated by [facility] for patients greater than or equal to 34 year old. It is not  intended to diagnose infection nor to guide or monitor treatment. RESULT CALLED TO, READ BACK BY AND VERIFIED WITH: [facility] AT 0557 BY HUFFINES,S ON 01/11/17.   Cath Tip Culture     Status: Abnormal   Collection Time: 01/11/17  2:33 PM  Result Value Ref Range Status   Specimen Description PORTA CATH  Final   Special Requests   Final    CATH TIP SENT IN SWAB Performed at [facility], [place] [address], [place], [place] [address]    Culture <10,000 COLONIES/mL STAPHYLOCOCCUS AUREUS (A)  Final   Report Status 01/14/2017 FINAL  Final    Radiology Studies: US Venous Img Lower Bilateral  Result Date: 01/19/2017 CLINICAL DATA:  Swollen legs for 3 days EXAM: BILATERAL LOWER EXTREMITY VENOUS DOPPLER ULTRASOUND TECHNIQUE: Gray-scale sonography with graded compression, as well as color Doppler and duplex ultrasound were performed to evaluate the lower extremity deep venous systems from the level of the common femoral vein and including the common femoral, femoral, profunda femoral, popliteal and calf veins including the posterior tibial, peroneal and gastrocnemius veins when visible. The superficial great saphenous vein was also interrogated. Spectral Doppler was utilized to evaluate flow at rest and with distal augmentation maneuvers in the common femoral, femoral and popliteal veins. COMPARISON:  None. FINDINGS: RIGHT LOWER EXTREMITY Common Femoral Vein: No evidence of thrombus. Normal compressibility, respiratory phasicity and response to augmentation. Saphenofemoral Junction: No evidence of thrombus. Normal compressibility and flow on color Doppler imaging. Profunda Femoral Vein: No evidence of thrombus. Normal compressibility and flow on color Doppler imaging. Femoral Vein: No evidence of thrombus. Normal compressibility, respiratory phasicity and response to augmentation. Popliteal Vein: No evidence of thrombus. Normal compressibility, respiratory phasicity and response to augmentation. Calf  Veins: No evidence of thrombus. Normal compressibility and flow on color Doppler imaging. Superficial Great Saphenous Vein: No evidence of thrombus. Normal compressibility and flow on color Doppler imaging. Venous Reflux:  None. Other Findings:  None. LEFT LOWER EXTREMITY Common Femoral Vein: No evidence of thrombus. Normal compressibility, respiratory phasicity and response to augmentation. Saphenofemoral Junction: No evidence of thrombus. Normal compressibility and flow on color Doppler imaging. Profunda Femoral Vein: No evidence of thrombus. Normal compressibility and flow on color Doppler imaging. Femoral Vein: No evidence of thrombus. Normal compressibility, respiratory phasicity and response to augmentation. Popliteal Vein: No evidence of thrombus. Normal compressibility, respiratory phasicity and response to augmentation. Calf Veins: No evidence of thrombus. Normal compressibility and flow on color Doppler imaging. Superficial Great Saphenous Vein: No evidence of thrombus. Normal compressibility and flow on color Doppler imaging. Venous Reflux:  None. Other Findings:  None. IMPRESSION: No evidence of deep venous thrombosis of bilateral lower extremities. Electronically Signed   By: Kathreen Devoid   On: 01/19/2017 11:14    Scheduled Meds: . docusate sodium  100 mg Oral BID  . feeding supplement (PRO-STAT SUGAR FREE 64)  30 mL Oral BID  . folic acid  1 mg Oral Daily  . furosemide  40 mg Intravenous Daily  . gabapentin  100 mg Oral TID  .  HYDROmorphone (DILAUDID) injection  2 mg Intravenous Q1H while awake  . hydroxyurea  1,000 mg Oral Daily  . L-glutamine  5 g Oral  BID  . loratadine  10 mg Oral Daily  . magnesium oxide  400 mg Oral BID  . morphine  30 mg Oral Q12H  . oxyCODONE  10 mg Oral Q4H  . polyethylene glycol  17 g Oral Daily  . potassium chloride  40 mEq Oral TID  . senna-docusate  1 tablet Oral BID   Continuous Infusions: . sodium chloride 250 mL (01/17/17 2044)  . sodium chloride 10  mL/hr at 01/09/17 1543  . ceFAZolin (ANCEF) IVPB (doses >1 g) 2 g (01/20/17 0500)  . magnesium sulfate 1 - 4 g bolus IVPB 2 g (01/20/17 0921)  . potassium chloride      LOS: 18 days   Kerney Elbe, DO [facility] Pager [phone]  If 7PM-7AM, please contact night-coverage [email or web address] Password [email or web address] 01/20/2017, 10:20 AM

## 2017-01-20 NOTE — Progress Notes (Signed)
CRITICAL VALUE ALERT  Critical value received:  Hemoglobin 6.2  Date of notification:  01/20/2017  Time of notification:  0749  Critical value read back: Yes  Nurse who received alert:  Vista Deck  MD notified (1st page):  Dr. Alfredia Ferguson, O  Time of first page:  [phone]  Responding MD:  Dr. Alfredia Ferguson, Jenetta Downer  Time MD responded:  [phone]

## 2017-01-21 LAB — COMPREHENSIVE METABOLIC PANEL
ALBUMIN: 2 g/dL — AB (ref 3.5–5.0)
ALK PHOS: 126 U/L (ref 38–126)
ALT: 11 U/L — ABNORMAL LOW (ref 14–54)
ANION GAP: 5 (ref 5–15)
AST: 98 U/L — AB (ref 15–41)
BILIRUBIN TOTAL: 9.5 mg/dL — AB (ref 0.3–1.2)
BUN: 7 mg/dL (ref 6–20)
CO2: 29 mmol/L (ref 22–32)
Calcium: 7.8 mg/dL — ABNORMAL LOW (ref 8.9–10.3)
Chloride: 103 mmol/L (ref 101–111)
Creatinine, Ser: 0.63 mg/dL (ref 0.44–1.00)
GFR calc Af Amer: >60 mL/min (ref >60)
GLUCOSE: 122 mg/dL — AB (ref 65–99)
Potassium: 3.4 mmol/L — ABNORMAL LOW (ref 3.5–5.1)
Sodium: 137 mmol/L (ref 135–145)
Total Protein: 6.3 g/dL — ABNORMAL LOW (ref 6.5–8.1)

## 2017-01-21 LAB — CBC WITH DIFFERENTIAL/PLATELET
BAND NEUTROPHILS: 1 %
BASOS ABS: 0.1 10*3/uL (ref 0.0–0.1)
BLASTS: 0 %
Basophils Relative: 1 %
Eosinophils Absolute: 0.2 10*3/uL (ref 0.0–0.7)
Eosinophils Relative: 2 %
HEMATOCRIT: 18.2 % — AB (ref 36.0–46.0)
HEMOGLOBIN: 6.4 g/dL — AB (ref 12.0–15.0)
LYMPHS ABS: 1.9 10*3/uL (ref 0.7–4.0)
LYMPHS PCT: 17 %
MCH: 34.4 pg — ABNORMAL HIGH (ref 26.0–34.0)
MCHC: 35.2 g/dL (ref 30.0–36.0)
MCV: 97.8 fL (ref 78.0–100.0)
METAMYELOCYTES PCT: 0 %
MONOS PCT: 6 %
Monocytes Absolute: 0.7 10*3/uL (ref 0.1–1.0)
Myelocytes: 0 %
NEUTROS ABS: 8.1 10*3/uL — AB (ref 1.7–7.7)
Neutrophils Relative %: 73 %
PROMYELOCYTES ABS: 0 %
Platelets: 464 10*3/uL — ABNORMAL HIGH (ref 150–400)
RBC: 1.86 MIL/uL — AB (ref 3.87–5.11)
RDW: 21.3 % — ABNORMAL HIGH (ref 11.5–15.5)
WBC: 11 10*3/uL — AB (ref 4.0–10.5)
nRBC: 0 /100 WBC

## 2017-01-21 LAB — PHOSPHORUS: Phosphorus: 3 mg/dL (ref 2.5–4.6)

## 2017-01-21 LAB — RETICULOCYTES
RBC.: 1.86 MIL/uL — ABNORMAL LOW (ref 3.87–5.11)
RETIC CT PCT: 22.7 % — AB (ref 0.4–3.1)
Retic Count, Absolute: 422.2 10*3/uL — ABNORMAL HIGH (ref 19.0–186.0)

## 2017-01-21 LAB — MAGNESIUM: Magnesium: 1.7 mg/dL (ref 1.7–2.4)

## 2017-01-21 LAB — LACTATE DEHYDROGENASE: LDH: 253 U/L — AB (ref 98–192)

## 2017-01-21 MED ORDER — HYDROMORPHONE HCL 2 MG/ML IJ SOLN
2.0000 mg | INTRAMUSCULAR | Status: DC
  Administered 2017-01-21 – 2017-01-22 (×20): 2 mg via INTRAVENOUS
  Filled 2017-01-21 (×20): qty 1

## 2017-01-21 MED ORDER — FUROSEMIDE 10 MG/ML IJ SOLN
60.0000 mg | Freq: Every day | INTRAMUSCULAR | Status: DC
  Administered 2017-01-21 – 2017-01-24 (×4): 60 mg via INTRAVENOUS
  Filled 2017-01-21 (×4): qty 6

## 2017-01-21 MED ORDER — CEFAZOLIN SODIUM-DEXTROSE 2-4 GM/100ML-% IV SOLN
2.0000 g | Freq: Three times a day (TID) | INTRAVENOUS | Status: DC
  Administered 2017-01-21 – 2017-01-25 (×12): 2 g via INTRAVENOUS
  Filled 2017-01-21 (×22): qty 100

## 2017-01-21 MED ORDER — HYDROMORPHONE HCL 1 MG/ML IJ SOLN
1.0000 mg | Freq: Once | INTRAMUSCULAR | Status: AC
  Administered 2017-01-21: 1 mg via INTRAVENOUS

## 2017-01-21 MED ORDER — MAGNESIUM SULFATE 2 GM/50ML IV SOLN
2.0000 g | Freq: Once | INTRAVENOUS | Status: AC
  Administered 2017-01-21: 2 g via INTRAVENOUS
  Filled 2017-01-21: qty 50

## 2017-01-21 NOTE — Progress Notes (Signed)
PROGRESS NOTE    Dawn [name]  YQM:[id number] DOB: 12-May-1981 DOA: 01/02/2017 PCP: [name], FNP   Brief Narrative:  36 year old female with history of sickle cell anemia, presented with sickle cell crisis. She's had 13 admissions in the past 6 months and 27 ED visits in the last 6 months. She was admitted for sickle cell anemia pain crisis. She was on Dilaudid PCA and was transfused 4 units of PRBCs for worsening anemia. Hospital course complicated by development of fever and evidence of staph aureus bacteremia. Port-A-Cath that was placed earlier this month has been removed. Further workup including TEE did not show any vegetations. PICC line was placed for long-term antibiotics. Discussed with Dr. Liston Alba of Sickle Cell and patient was scheduled Oxycodone and restarted on Hydroxeyurea. Patient came off the PCA 01/19/17 and scheduled 1 mg of IV Dilaudid q2h while awake in addition to scheduled MS Contin and Oxycodone IR. She states she had a better night yesterday and patient is still being diuresed and improving slowly.   Assessment & Plan:   Principal Problem:   Sickle cell crisis (HCC) Active Problems:   Sickle cell disease (HCC)   Elevated LFTs   Hyperbilirubinemia   Thrombocytopenia (HCC)   Chronic pain syndrome   Anemia of chronic disease   Bacteremia due to Staphylococcus aureus  1. Acute Sickle Cell Pain Crisis. -Continues to have uncontrolled pain ranging from 7-8/10, required Dilaudid PCA. Weaned down Dilaudid PCA and titrated off.  -Patient is on Dilaudid 2 mg IV 12h while awake and will continue for today and re-evaluate in AM -She is continued on her long-acting narcotics with Oxycodone 10 mg po q4hprn for Moderate Pain -C/w MS Contin to 30 mg po q12h.  -Upon discussion with the patient she was taking Oxycodone 10 mg po q4hprn only as needed as an outpatient and started taking it scheduled when she was going into crisis.  -C/w 10 mg of Oxycodone q4h and with  Chemotherapy 100 TID -Discussed Case with Dr. Liston Alba who recommended starting back on 1000 mg of Hydroxyurea and recommended against transfusion at this point.  -Continue to Ambulate  2. Sickle Cell Anemia -She did receive 2 units of PRBCs and 4/5 with improvement of hemoglobin. She received another 2 units on 4/19 in preparation for surgery for Removal of Port-A-Cath.  -Hb/Hct went from 5.9/17.1 -> 5.7/16.2 -> 7.2/20.3 -> 6.2/18.1 -> 6.4/18.2 -LDH was 253; Reticulocyte Count Percentage was 22.7 and Retic Count Manually was 422.2 -There is no obvious bleeding.  -This is likely related to her sickle crisis.  -Patient did not get 2 units of Blood that were ordered for her 01/16/17; Infusion was stopped prior to any blood being transfused -C/w 1000 mg of Hydroxyurea at this time based on Dr. Zigmund Daniel Recommendation to increase HbF -Continue to monitor and repeat in AM with Reticulocyte Count  3. Staph Aureus Bacteremia.  [facility] course has been complicated by development of fever.  -Chest x-ray did not show any significant findings.  -Blood cultures returned positive for MSSA bacteremia. -Discussed with Infectious Disease who recommended removal of Port-A-Cath which was completed on 4/20.  -Catheter tip culture also positive for staph aureus. -Repeat blood cultures drawn on 4/19 have shown no growth.  -TEE did not show any evidence of vegetations and MRI L spine did not show infection/discitis.  -Since this is a recurrent bacteremia (last episode was in 10/2016), she will need 4 weeks of IV antibiotics to be completed on 5/17.   -  She is currently on IV Cefazolin 2 gram q8h and will continue -Once antibiotics are complete, she should have repeat blood cultures drawn on 5/22 to ensure that bacteremia has cleared, prior to any further port placement.   4. Thrombocytopenia and now Thrombocytosis -Possibly related to crisis. Improved as Plts went from 86 -> 464 -Repeat CBC in  AM  5. Chronic Hyperbilirubinemia -T Bili went from 16.7 -> 13.7 -> 11.8 -> 9.7 -> 12.2 -> 10.4 -> 9.5 -Likely secondary to hemolysis from Sickle Cell.  -No abdominal pain, nausea or vomiting. Continue to monitor. -Repeat CMP in AM  6. Lower Extremity Edema/Anasarca, improving -Per Record Patient was +13.7 Liters and has gained 28 lbs since admission; Now she is +75 mL and 174(down 22 pounds) -Likely related to aggressive hydration and hypoalbuminemia.  -Fluids have been discontinued but continues to receive Abx with Piggybacks.  -Checked Venous Ultrasound for DVT as Right leg is bigger than Left and showed no Evidence of DVT of Bilateral Lower Extremities -Gave IV Lasix 60 mg this AM and will re-evaluate in AM in order to avoid overdiuresis -C/w Compression stockings for patient  -Continue to monitor Strict I's and O's and Daily Weights and re-evaluate need for continued IV Lasix in AM  7. AKI, Improved -BUN/Cr went from 28/1.40 (on 01/13/17) -> 7/0.46 -> 5/0.43 -> 6/0.40 -> 7/0.63 -Possibly related to Toradol and lasix.  -Toradol has been discontinued. Renal function has since improved. Creatinine currently stable -Repeat CMP in AM  8. Hypoalbuminemia.  -Albumin Level was 2.0 this AM -No proteinuria on urinalysis. Likely related to poor po intake and Sickle Cell -Nutrition consult -C/w Prostat Feeding Supplement 30 mL po BID  9. Hypokalemia -Patient's K+ was 2.8 this AM -Replete with Potassium Chloride 40 mEQ po TID -Continue to Monitor and Repeat CMP in AM  10. Hypomagnesemia -Mag Level was 1.7 -Replete with IV 2 grams of Mag Sulfate  -C/w po Mag Oxide 400 mg po BID at the recommendation of Dr. Zigmund Daniel -Repeat Mag Level in AM  11. Abnormal LFT's/Elevated AST -AST this AM was 98 (96) and ALT was 11 (12); -Continue to Trend  12. Leukocytosis,  -Likely reactive to pain and Sickle Cell  -WBC went from 17.9 -> 15.4 -> 12.9 -> 10.4 -> 11.6 -> 11.1 -> 11.0 -On Abx with  Cefazolin 2 grams IV q8h -Repeat CBC on Sunday and continue to Monitor  DVT prophylaxis: SCDs Code Status: FULL CODE Family Communication: No Family present at bedside Disposition Plan: Home when medically stable for D/C  Consultants:   Cardiology for TEE  General Surgery for Port-A-Cath Removal  Infectious Diseases via Phone Consultation   Dr. Liston Alba of Sickle Cell   Procedures:  2D echo: - Left ventricle: The cavity size was normal. Wall thickness was normal. Systolic function was normal. The estimated ejection fraction was in the range of 60% to 65%. Wall motion was normal; there were no regional wall motion abnormalities. The study is not technically sufficient to allow evaluation of LV diastolic function. - Aortic valve: Valve area (VTI): 2.19 cm^2. Valve area (Vmax): 2.23 cm^2. Valve area (Vmean): 2.14 cm^2. - Left atrium: The atrium was moderately dilated. - Right atrium: The atrium was mildly dilated. - Pulmonary arteries: Systolic pressure was moderately increased. PA peak pressure: 37 mm Hg (S). - Pericardium, extracardiac: There is a small circumferential pericardial effusion.  - Technically adequate study.   TEE: No evidence of endocarditis. There was no evidence of a  vegetation.  Removal of portacath by Dr. Arnoldo Morale on 4/20  Placement of femoral central line by Dr. Arnoldo Morale on 4/20 .   Antimicrobials:  Anti-infectives    Start     Dose/Rate Route Frequency Ordered Stop   01/21/17 2000  ceFAZolin (ANCEF) IVPB 2g/100 mL premix     2 g 200 mL/hr over 30 Minutes Intravenous Every 8 hours 01/21/17 1046     01/09/17 2000  ceFAZolin (ANCEF) IVPB 1 g/50 mL premix - [place]  Status:  Discontinued     2 g 200 mL/hr over 30 Minutes Intravenous Every 8 hours 01/09/17 1156 01/09/17 1157   01/09/17 2000  ceFAZolin (ANCEF) 2 g in dextrose 5 % 100 mL IVPB     2 g 200 mL/hr over 30 Minutes Intravenous Every 8 hours 01/09/17 1157  01/21/17 1959   01/09/17 1800  vancomycin (VANCOCIN) IVPB 750 mg/150 ml premix  Status:  Discontinued     750 mg 150 mL/hr over 60 Minutes Intravenous Every 8 hours 01/09/17 0924 01/09/17 1156   01/09/17 1000  vancomycin (VANCOCIN) 1,500 mg in sodium chloride 0.9 % 500 mL IVPB     1,500 mg 250 mL/hr over 120 Minutes Intravenous  Once 01/09/17 0924 01/09/17 1236   01/03/17 1300  metroNIDAZOLE (FLAGYL) tablet 500 mg  Status:  Discontinued     500 mg Oral 2 times daily 01/03/17 1255 01/12/17 1225     Subjective: Seen and examined and states she had a better night but still had same amount of pain. Does not want to go back on Dilaudid PCA. States Right leg is still bigger than the Left. No nausea or vomiting and not SOB.   Objective: Vitals:   01/20/17 0621 01/20/17 1358 01/20/17 2148 01/21/17 0609  BP: 133/85 140/71 129/71 140/79  Pulse: 95 (!) 108 97 (!) 106  Resp: 18 18 16 18   Temp: 98.6 F (37 C) 98.6 F (37 C) 98.5 F (36.9 C) 98.7 F (37.1 C)  TempSrc: Oral Oral Oral Oral  SpO2: 97% 99% 100% 95%  Weight: 78.5 kg (173 lb)   79.1 kg (174 lb 6.4 oz)  Height:        Intake/Output Summary (Last 24 hours) at 01/21/17 1411 Last data filed at 01/21/17 1224  Gross per 24 hour  Intake             1284 ml  Output             1700 ml  Net             -416 ml   Filed Weights   01/19/17 0500 01/20/17 0621 01/21/17 0609  Weight: 81.6 kg (179 lb 12.8 oz) 78.5 kg (173 lb) 79.1 kg (174 lb 6.4 oz)   Examination:  Physical Exam:  Constitutional: WN/WD obese AAF, NAD and appears calm sitting up in bed Eyes: Lids and conjunctivae normal,  ENMT: External Ears, Nose appear normal. Grossly normal hearing.  Neck: Appears normal, supple, no cervical masses, normal ROM, no appreciable thyromegaly, no visible JVD Respiratory: Diminished to auscultation bilaterally, no wheezing, rales, rhonchi or crackles. Normal respiratory effort and patient is not tachypenic. No accessory muscle use. Patient  not wearing supplemental O2 via [place] this AM.   Cardiovascular: Slightly tachycardic and loud S1 S2, no appreciable murmurs / rubs / gallops. 1+ Lower extremity pitting edema with Right leg still worse than Left.  Abdomen: Soft, non-tender, non-distended. No masses palpated. No appreciable hepatosplenomegaly. Bowel sounds positive x4.  GU: Deferred. Musculoskeletal:  No clubbing / cyanosis of digits/nails. No joint deformity upper and lower extremities.   Skin: No rashes, lesions, ulcers on limited skin evaluation. No induration; Warm and dry. Wearing compression stockings but has scar on Left left from where she tore her ACL.  Neurologic: CN 2-12 grossly intact with no focal deficits.  Romberg sign cerebellar reflexes not assessed.  Psychiatric: Normal judgment and insight. Alert and oriented x 3. Pleasant mood and appropriate affect.   Data Reviewed: I have personally reviewed following labs and imaging studies  CBC:  Recent Labs Lab 01/17/17 0538 01/18/17 0614 01/19/17 1155 01/20/17 0629 01/21/17 0601  WBC 12.9* 10.4 11.6* 11.1* 11.0*  NEUTROABS 7.2 5.9 7.5 7.5 8.1*  HGB 5.9* 5.7* 7.2* 6.2* 6.4*  HCT 17.1* 16.2* 20.3* 18.1* 18.2*  MCV 95.0 95.9 97.1 97.3 97.8  PLT 417* 429* 439* 491* 161*   Basic Metabolic Panel:  Recent Labs Lab 01/17/17 0538 01/18/17 0614 01/19/17 1155 01/20/17 0629 01/21/17 0601  NA 137 137 140 137 137  K 3.1* 2.5* 4.0 2.8* 3.4*  CL 100* 105 102 100* 103  CO2 30 26 31 31 29   GLUCOSE 89 109* 93 91 122*  BUN 7 5* 6 8 7   CREATININE 0.46 0.43* 0.40* 0.63 0.63  CALCIUM 7.8* 6.9* 8.1* 7.7* 7.8*  MG 1.8 1.4* 1.8 1.7 1.7  PHOS 3.3 2.5 2.9 3.9 3.0   GFR: Estimated Creatinine Clearance: 96.8 mL/min (by C-G formula based on SCr of 0.63 mg/dL). Liver Function Tests:  Recent Labs Lab 01/17/17 0538 01/18/17 0614 01/19/17 1155 01/20/17 0629 01/21/17 0601  AST 109* 88* 135* 96* 98*  ALT 10* 10* 16 12* 11*  ALKPHOS 133* 119 139* 128* 126  BILITOT 11.8*  9.7* 12.2* 10.4* 9.5*  PROT 6.1* 5.4* 7.0 6.2* 6.3*  ALBUMIN 2.1* 1.8* 2.3* 2.0* 2.0*   No results for input(s): LIPASE, AMYLASE in the last 168 hours. No results for input(s): AMMONIA in the last 168 hours. Coagulation Profile: No results for input(s): INR, PROTIME in the last 168 hours. Cardiac Enzymes: No results for input(s): CKTOTAL, CKMB, CKMBINDEX, TROPONINI in the last 168 hours. BNP (last 3 results) No results for input(s): PROBNP in the last 8760 hours. HbA1C: No results for input(s): HGBA1C in the last 72 hours. CBG: No results for input(s): GLUCAP in the last 168 hours. Lipid Profile: No results for input(s): CHOL, HDL, LDLCALC, TRIG, CHOLHDL, LDLDIRECT in the last 72 hours. Thyroid Function Tests: No results for input(s): TSH, T4TOTAL, FREET4, T3FREE, THYROIDAB in the last 72 hours. Anemia Panel:  Recent Labs  01/20/17 0629 01/21/17 0601  RETICCTPCT 21.7* 22.7*   Sepsis Labs: No results for input(s): PROCALCITON, LATICACIDVEN in the last 168 hours.  Recent Results (from the past 240 hour(s))  Cath Tip Culture     Status: Abnormal   Collection Time: 01/11/17  2:33 PM  Result Value Ref Range Status   Specimen Description PORTA CATH  Final   Special Requests   Final    CATH TIP SENT IN SWAB Performed at [facility], [place] [address] [place] [employer], [place] [address]    Culture <10,000 COLONIES/mL STAPHYLOCOCCUS AUREUS (A)  Final   Report Status 01/14/2017 FINAL  Final    Radiology Studies: No results found.  Scheduled Meds: . docusate sodium  100 mg Oral BID  . feeding supplement (PRO-STAT SUGAR FREE 64)  30 mL Oral BID  . folic acid  1 mg Oral Daily  . furosemide  60 mg Intravenous Daily  .  gabapentin  100 mg Oral TID  .  HYDROmorphone (DILAUDID) injection  2 mg Intravenous Q1H while awake  . hydroxyurea  1,000 mg Oral Daily  . L-glutamine  5 g Oral BID  . loratadine  10 mg Oral Daily  . magnesium oxide  400 mg Oral BID  . morphine  30 mg Oral Q12H    . oxyCODONE  10 mg Oral Q4H  . polyethylene glycol  17 g Oral Daily  . potassium chloride  40 mEq Oral TID  . senna-docusate  1 tablet Oral BID   Continuous Infusions: . sodium chloride 250 mL (01/17/17 2044)  . sodium chloride 10 mL/hr at 01/09/17 1543  . ceFAZolin (ANCEF) IVPB (doses >1 g) Stopped (01/21/17 1127)  .  ceFAZolin (ANCEF) IV      LOS: 19 days   Kerney Elbe, DO [facility] Pager [phone]  If 7PM-7AM, please contact night-coverage [email or web address] Password [email or web address] 01/21/2017, 2:11 PM

## 2017-01-21 NOTE — Progress Notes (Signed)
Nutrition Follow-up   INTERVENTION:  Continue-ProStat 30 ml TID (each 30 ml provides 100 kcal, 15 gr protein)   Recommend High Calorie/High Protein diet  Nursing continue to offer snack between meals from nourishment room.   NUTRITION DIAGNOSIS:   Increased nutrient needs related to chronic illness (sickle cell disease) as evidenced by estimated needs (albumin 1.5 , moderate fluid accumulation to BLE).   GOAL:   Patient will meet greater than or equal to 90% of their needs   MONITOR:   PO intake, Supplement acceptance, Labs, Weight trends  REASON FOR ASSESSMENT:   Consult Assessment of nutrition requirement/status  ASSESSMENT:  patient is a 36 yo female with sickle cell disease. Her hx also includes B12 deficiency, demand ischemia and DM.    Her appetite remains fairly good. She is not especially hungry but says she is eating anyway because she understands the importance. Meal intake averaging 75-100% and also she is being compliant with protein modular.   Her weight hx is up (6#) to 79 kg in the past 7 days.      Labs/Meds- Reviewed. She is taking Lasix, folic acid, potassium, colace  Nutrition-Focused physical exam: WDL -- Except BLE edema (she is wearing ted hose today).  Diet Order:  Diet regular Room service appropriate? Yes; Fluid consistency: Thin  Skin:  Reviewed, no issues  Last BM:  4/23  Height:   Ht Readings from Last 1 Encounters:  01/02/17 5\' 3"  (1.6 m)    Weight:   Wt Readings from Last 1 Encounters:  01/21/17 174 lb 6.4 oz (79.1 kg)    Ideal Body Weight:  52 kg  BMI:  Body mass index is 30.89 kg/m.  Estimated Nutritional Needs:   Kcal:  1800-1900 kcal ([place] AF-1.3)  Protein:  98-114 gr (1.3-1.5 gr/kg)  Fluid:  1.8-1.9 liters daily  EDUCATION NEEDS:   Education needs addressed (related to increased protein needs )  Colman Cater MS,RD,CSG,LDN Office: [phone] Pager: [phone]

## 2017-01-22 LAB — CBC WITH DIFFERENTIAL/PLATELET
Basophils Absolute: 0 10*3/uL (ref 0.0–0.1)
Basophils Relative: 0 %
EOS PCT: 6 %
Eosinophils Absolute: 0.6 10*3/uL (ref 0.0–0.7)
HEMATOCRIT: 17.6 % — AB (ref 36.0–46.0)
Hemoglobin: 6.1 g/dL — CL (ref 12.0–15.0)
Lymphocytes Relative: 26 %
Lymphs Abs: 2.4 10*3/uL (ref 0.7–4.0)
MCH: 33.7 pg (ref 26.0–34.0)
MCHC: 34.7 g/dL (ref 30.0–36.0)
MCV: 97.2 fL (ref 78.0–100.0)
MONO ABS: 1.1 10*3/uL — AB (ref 0.1–1.0)
Monocytes Relative: 12 %
NEUTROS ABS: 5.2 10*3/uL (ref 1.7–7.7)
Neutrophils Relative %: 56 %
Platelets: 439 10*3/uL — ABNORMAL HIGH (ref 150–400)
RBC: 1.81 MIL/uL — AB (ref 3.87–5.11)
RDW: 19.8 % — AB (ref 11.5–15.5)
WBC: 9.3 10*3/uL (ref 4.0–10.5)

## 2017-01-22 LAB — RETICULOCYTES: RBC.: 1.81 MIL/uL — AB (ref 3.87–5.11)

## 2017-01-22 LAB — COMPREHENSIVE METABOLIC PANEL
ALBUMIN: 2 g/dL — AB (ref 3.5–5.0)
ALK PHOS: 134 U/L — AB (ref 38–126)
ALT: 12 U/L — ABNORMAL LOW (ref 14–54)
AST: 99 U/L — ABNORMAL HIGH (ref 15–41)
Anion gap: 6 (ref 5–15)
BILIRUBIN TOTAL: 9.1 mg/dL — AB (ref 0.3–1.2)
BUN: 7 mg/dL (ref 6–20)
CALCIUM: 7.9 mg/dL — AB (ref 8.9–10.3)
CO2: 28 mmol/L (ref 22–32)
Chloride: 101 mmol/L (ref 101–111)
Creatinine, Ser: 0.59 mg/dL (ref 0.44–1.00)
GFR calc Af Amer: >60 mL/min (ref >60)
Glucose, Bld: 79 mg/dL (ref 65–99)
POTASSIUM: 3.4 mmol/L — AB (ref 3.5–5.1)
Sodium: 135 mmol/L (ref 135–145)
TOTAL PROTEIN: 6.4 g/dL — AB (ref 6.5–8.1)

## 2017-01-22 LAB — LACTATE DEHYDROGENASE: LDH: 234 U/L — ABNORMAL HIGH (ref 98–192)

## 2017-01-22 LAB — MAGNESIUM: Magnesium: 1.8 mg/dL (ref 1.7–2.4)

## 2017-01-22 LAB — PHOSPHORUS: Phosphorus: 3.3 mg/dL (ref 2.5–4.6)

## 2017-01-22 MED ORDER — HYDROMORPHONE HCL 2 MG/ML IJ SOLN
2.0000 mg | INTRAMUSCULAR | Status: DC
  Administered 2017-01-22 – 2017-01-23 (×12): 2 mg via INTRAVENOUS
  Filled 2017-01-22 (×12): qty 1

## 2017-01-22 NOTE — Care Management Note (Signed)
Case Management Note  Patient Details  Name: Dawn [name] MRN: [id number] Date of Birth: [birth year]     Expected Discharge Date:        unk          Expected Discharge Plan:  Home/Self Care  In-House Referral:     Discharge planning Services  CM Consult  Post Acute Care Choice:    Choice offered to:  NA  DME Arranged:    DME Agency:     HH Arranged:    [place] Agency:     Status of Service:  Completed, signed off  If discussed at [place] of Stay Meetings, dates discussed:  01/22/2017  Additional Comments:   Continued pain, getting IV pain meds q2hour. Will need IV antibiotics at time of discharge. Only has Medicare A and B. Will have to pay OOP for IV antibiotics. Financial Counselor notified and will see patient today.  Theoden Mauch, Chauncey [place], RN 01/22/2017, 10:33 AM

## 2017-01-22 NOTE — Plan of Care (Signed)
Problem: Activity: Goal: Risk for activity intolerance will decrease Outcome: Progressing Pt continues to get up to BR independently and continues to walk around in room and in hallway. Pt still has pain in both legs and back but states she feels better getting around. Edema continues to be present, TED hose on and removed at bedtime. Will continue to monitor pt

## 2017-01-22 NOTE — Consult Note (Signed)
   [facility] CM Inpatient Consult   01/22/2017  KENLEA WOODELL [birth year] [id number]   Patient eligible for [facility] services as part of her [employer] Medicare plan and multiple ED visits and hospital admissions. Spoke with inpatient case manager about patient and she conveyed there were new circumstances which would warrant another attempt to offer [facility] services to patient(services offered previously this admission and patient refused). Patient needing assistance with signing up for medicaid and also has been ordered a medication she could potentially have to pay out of pocket for. Also talked with Vaughan Basta from [place] care related to patient's medication expense which could be 34.00$ per day and there was a question in regards to patient's medicare not being primary insurance with their company. Went to patient's room to discuss these matter's and offer [facility] services again in lieu of new medication cost circumstances. Explained [facility] services again. Patient expressed the financial counselor for [facility] had been by and she felt certain she was going to be able to assist her obtain medicaid. Patient declined [facility] services, but accepted pamphlet with contact information in case she needed assistance. Also made [facility] with [place] care know if they run into trouble she can refer patient to [facility] services. For questions or concerns please contact:  Salvator Seppala RN, [facility] Liaison  [phone]) Business Mobile [phone]) Toll free office

## 2017-01-22 NOTE — Progress Notes (Addendum)
PROGRESS NOTE    Dawn [name]  GQB:[id number] DOB: Mar 08, 1981 DOA: 01/02/2017 PCP: [name], FNP   Brief Narrative:  36 year old female with history of sickle cell anemia, presented with sickle cell crisis. She's had 13 admissions in the past 6 months and 27 ED visits in the last 6 months. She was admitted for sickle cell anemia pain crisis. She was on Dilaudid PCA and was transfused 4 units of PRBCs for worsening anemia. Hospital course complicated by development of fever and evidence of staph aureus bacteremia. Port-A-Cath that was placed earlier this month has been removed. Further workup including TEE did not show any vegetations. PICC line was placed for long-term antibiotics. Discussed with Dr. Liston Alba of Sickle Cell and patient was scheduled Oxycodone and restarted on Hydroxeyurea. Patient came off the PCA 01/19/17 and was scheduled 2 mg of IV Dilaudid q2h while awake in addition to scheduled MS Contin and Oxycodone IR. She states she had a better night yesterday and patient is still being diuresed and improving slowly. Attempting to wean down Dilaudid.  Assessment & Plan:   Principal Problem:   Sickle cell crisis (HCC) Active Problems:   Sickle cell disease (HCC)   Elevated LFTs   Hyperbilirubinemia   Thrombocytopenia (HCC)   Chronic pain syndrome   Anemia of chronic disease   Bacteremia due to Staphylococcus aureus  1. Acute Sickle Cell Pain Crisis. -Continues to have uncontrolled pain ranging from 7-8/10, required Dilaudid PCA. Weaned down Dilaudid PCA and titrated off.  -Patient is on Dilaudid 2 mg IV q2h while awake and will continue for today and re-evaluate in AM and attempt to make prn (Was on 2 mg IV q1h yesterday). -She is continued on her long-acting narcotics with Oxycodone 10 mg po q4hprn for Moderate Pain -C/w MS Contin to 30 mg po q12h.  -Upon discussion with the patient she was taking Oxycodone 10 mg po q4hprn only as needed as an outpatient and  started taking it scheduled when she was going into crisis.  -C/w 10 mg of Oxycodone q4h and with Chemotherapy 100 TID -Discussed Case with Dr. Liston Alba who recommended starting back on 1000 mg of Hydroxyurea and recommended against transfusion at this point.  -Continue to Ambulate  2. Sickle Cell Anemia -She did receive 2 units of PRBCs and 4/5 with improvement of hemoglobin. She received another 2 units on 4/19 in preparation for surgery for Removal of Port-A-Cath.  -Hb/Hct went from 5.9/17.1 -> 5.7/16.2 -> 7.2/20.3 -> 6.2/18.1 -> 6.4/18.2 -> 6.1/17.6 -LDH was 234; Reticulocyte Count Percentage was >23.0 and Retic Count Manually was Not Calculated -There is no obvious bleeding.  -This is likely related to her sickle crisis.  -Patient did not get 2 units of Blood that were ordered for her 01/16/17; Infusion was stopped prior to any blood being transfused -C/w 1000 mg of Hydroxyurea at this time based on Dr. Zigmund Daniel Recommendation to increase HbF -Continue to monitor and repeat in AM with Reticulocyte Count  3. Staph Aureus Bacteremia.  [facility] course has been complicated by development of fever.  -Chest x-ray did not show any significant findings.  -Blood cultures returned positive for MSSA bacteremia. -Discussed with Infectious Disease who recommended removal of Port-A-Cath which was completed on 4/20.  -Catheter tip culture also positive for staph aureus. -Repeat blood cultures drawn on 4/19 have shown no growth.  -TEE did not show any evidence of vegetations and MRI L spine did not show infection/discitis.  -Since this is a recurrent bacteremia (  last episode was in 10/2016), she will need 4 weeks of IV antibiotics to be completed on 5/17.   -She is currently on IV Cefazolin 2 gram q8h and will continue -Once antibiotics are complete, she should have repeat blood cultures drawn on 5/22 to ensure that bacteremia has cleared, prior to any further port placement.   4.  Thrombocytopenia and now Thrombocytosis -Possibly related to crisis. Improved as Plts went from 86 -> 439 -Repeat CBC in AM  5. Chronic Hyperbilirubinemia -T Bili went from 16.7 -> 13.7 -> 11.8 -> 9.7 -> 12.2 -> 10.4 -> 9.5 -> 9.1 -Likely secondary to hemolysis from Sickle Cell.  -No abdominal pain, nausea or vomiting. Continue to monitor. -Repeat CMP in AM  6. Lower Extremity Edema/Anasarca, improving -Per Record Patient was +13.7 Liters and has gained 28 lbs since admission; Now she is +690 mL and 174 (down 22 pounds) -Likely related to aggressive hydration and hypoalbuminemia.  -Fluids have been discontinued but continues to receive Abx with Piggybacks.  -Checked Venous Ultrasound for DVT as Right leg is bigger than Left and showed no Evidence of DVT of Bilateral Lower Extremities -Gave IV Lasix 60 mg this AM and will re-evaluate in AM in order to avoid overdiuresis -C/w Compression stockings for patient  -Continue to monitor Strict I's and O's and Daily Weights and re-evaluate need for continued IV Lasix in AM  7. AKI, Improved -BUN/Cr went from 28/1.40 (on 01/13/17) -> 7/0.46 -> 5/0.43 -> 6/0.40 -> 7/0.63 -> 7/059 -Possibly related to Toradol and Lasix.  -Toradol has been discontinued. Renal function has since improved. Creatinine currently stable -Repeat CMP in AM  8. Hypoalbuminemia.  -Albumin Level was 2.0 this AM -No proteinuria on urinalysis. Likely related to poor po intake and Sickle Cell -Nutrition consult -C/w Prostat Feeding Supplement 30 mL po BID  9. Hypokalemia -Patient's K+ was 3.4 this AM -Replete with Potassium Chloride 40 mEQ po TID while being diuresed. -Continue to Monitor and Repeat CMP in AM  10. Hypomagnesemia -Mag Level was 1.8 -C/w po Mag Oxide 400 mg po BID at the recommendation of Dr. Zigmund Daniel -Repeat Mag Level in AM  11. Abnormal LFT's/Elevated AST -AST this AM was 99 (98) and ALT was 12 (11); -Continue to Trend  12. Leukocytosis,  improved -Likely reactive to pain and Sickle Cell  -WBC went from 17.9 -> 15.4 -> 12.9 -> 10.4 -> 11.6 -> 11.1 -> 11.0 -> 9.3 -On Abx with Cefazolin 2 grams IV q8h -Repeat CBC on Sunday and continue to Monitor  DVT prophylaxis: SCDs Code Status: FULL CODE Family Communication: No Family present at bedside Disposition Plan: Home when medically stable for D/C likely in 1-2 days  Consultants:   Cardiology for TEE  General Surgery for Port-A-Cath Removal  Infectious Diseases via Phone Consultation   Dr. Liston Alba of Sickle Cell   Procedures:  2D echo: - Left ventricle: The cavity size was normal. Wall thickness was normal. Systolic function was normal. The estimated ejection fraction was in the range of 60% to 65%. Wall motion was normal; there were no regional wall motion abnormalities. The study is not technically sufficient to allow evaluation of LV diastolic function. - Aortic valve: Valve area (VTI): 2.19 cm^2. Valve area (Vmax): 2.23 cm^2. Valve area (Vmean): 2.14 cm^2. - Left atrium: The atrium was moderately dilated. - Right atrium: The atrium was mildly dilated. - Pulmonary arteries: Systolic pressure was moderately increased. PA peak pressure: 37 mm Hg (S). - Pericardium, extracardiac: There is  a small circumferential pericardial effusion.  - Technically adequate study.   TEE: No evidence of endocarditis. There was no evidence of a  vegetation.    Removal of portacath by Dr. Arnoldo Morale on 4/20  Placement of femoral central line by Dr. Arnoldo Morale on 4/20 .   Antimicrobials:  Anti-infectives    Start     Dose/Rate Route Frequency Ordered Stop   01/21/17 2000  ceFAZolin (ANCEF) IVPB 2g/100 mL premix     2 g 200 mL/hr over 30 Minutes Intravenous Every 8 hours 01/21/17 1046     01/09/17 2000  ceFAZolin (ANCEF) IVPB 1 g/50 mL premix - [place]  Status:  Discontinued     2 g 200 mL/hr over 30 Minutes Intravenous Every 8 hours 01/09/17 1156  01/09/17 1157   01/09/17 2000  ceFAZolin (ANCEF) 2 g in dextrose 5 % 100 mL IVPB     2 g 200 mL/hr over 30 Minutes Intravenous Every 8 hours 01/09/17 1157 01/21/17 1959   01/09/17 1800  vancomycin (VANCOCIN) IVPB 750 mg/150 ml premix  Status:  Discontinued     750 mg 150 mL/hr over 60 Minutes Intravenous Every 8 hours 01/09/17 0924 01/09/17 1156   01/09/17 1000  vancomycin (VANCOCIN) 1,500 mg in sodium chloride 0.9 % 500 mL IVPB     1,500 mg 250 mL/hr over 120 Minutes Intravenous  Once 01/09/17 0924 01/09/17 1236   01/03/17 1300  metroNIDAZOLE (FLAGYL) tablet 500 mg  Status:  Discontinued     500 mg Oral 2 times daily 01/03/17 1255 01/12/17 1225     Subjective: Seen and examined and states she is still in pain but it appears to be improving. Right Leg is still bigger than left. Had some nausea earlier this AM. No other concerns or complaints.    Objective: Vitals:   01/21/17 1300 01/21/17 2200 01/22/17 0500 01/22/17 1300  BP: 134/81 (!) 129/91 138/86 (!) 152/95  Pulse: (!) 101 94 100 98  Resp: 18 17 16 18   Temp: 98.9 F (37.2 C) 98.9 F (37.2 C) 99 F (37.2 C) 99 F (37.2 C)  TempSrc: Oral Oral Oral Oral  SpO2: 95% 100% 98% 98%  Weight:   79.2 kg (174 lb 8 oz)   Height:        Intake/Output Summary (Last 24 hours) at 01/22/17 1829 Last data filed at 01/22/17 1400  Gross per 24 hour  Intake          1578.34 ml  Output              600 ml  Net           978.34 ml   Filed Weights   01/20/17 0621 01/21/17 0609 01/22/17 0500  Weight: 78.5 kg (173 lb) 79.1 kg (174 lb 6.4 oz) 79.2 kg (174 lb 8 oz)   Examination:  Physical Exam:  Constitutional: WN/WD obese AAF, NAD and appears calm sitting up in bed Eyes: Lids and conjunctivae normal ENMT: External Ears, Nose appear normal. Grossly normal hearing.  Neck: Appears normal, supple, no cervical masses, normal ROM, no appreciable thyromegaly, no visible JVD Respiratory: Diminished to auscultation bilaterally, no wheezing,  rales, rhonchi or crackles. Normal respiratory effort and patient is not tachypenic. No accessory muscle use.  Cardiovascular: Slightly tachycardic and loud S1 S2, no appreciable murmurs / rubs / gallops. 1+ Lower extremity pitting edema with Right leg still worse than Left.  Abdomen: Soft, non-tender, non-distended. No masses palpated. No appreciable hepatosplenomegaly. Bowel sounds positive x4.  GU: Deferred. Musculoskeletal: No clubbing / cyanosis of digits/nails. No joint deformity upper and lower extremities.   Skin: No rashes, lesions, ulcers on limited skin evaluation. No induration; Warm and dry.  Neurologic: CN 2-12 grossly intact with no focal deficits.  Romberg sign cerebellar reflexes not assessed.  Psychiatric: Normal judgment and insight. Alert and oriented x 3. Pleasant mood and appropriate affect.   Data Reviewed: I have personally reviewed following labs and imaging studies  CBC:  Recent Labs Lab 01/18/17 0614 01/19/17 1155 01/20/17 0629 01/21/17 0601 01/22/17 0507  WBC 10.4 11.6* 11.1* 11.0* 9.3  NEUTROABS 5.9 7.5 7.5 8.1* 5.2  HGB 5.7* 7.2* 6.2* 6.4* 6.1*  HCT 16.2* 20.3* 18.1* 18.2* 17.6*  MCV 95.9 97.1 97.3 97.8 97.2  PLT 429* 439* 491* 464* 469*   Basic Metabolic Panel:  Recent Labs Lab 01/18/17 0614 01/19/17 1155 01/20/17 0629 01/21/17 0601 01/22/17 0507  NA 137 140 137 137 135  K 2.5* 4.0 2.8* 3.4* 3.4*  CL 105 102 100* 103 101  CO2 26 31 31 29 28   GLUCOSE 109* 93 91 122* 79  BUN 5* 6 8 7 7   CREATININE 0.43* 0.40* 0.63 0.63 0.59  CALCIUM 6.9* 8.1* 7.7* 7.8* 7.9*  MG 1.4* 1.8 1.7 1.7 1.8  PHOS 2.5 2.9 3.9 3.0 3.3   GFR: Estimated Creatinine Clearance: 96.8 mL/min (by C-G formula based on SCr of 0.59 mg/dL). Liver Function Tests:  Recent Labs Lab 01/18/17 0614 01/19/17 1155 01/20/17 0629 01/21/17 0601 01/22/17 0507  AST 88* 135* 96* 98* 99*  ALT 10* 16 12* 11* 12*  ALKPHOS 119 139* 128* 126 134*  BILITOT 9.7* 12.2* 10.4* 9.5* 9.1*    PROT 5.4* 7.0 6.2* 6.3* 6.4*  ALBUMIN 1.8* 2.3* 2.0* 2.0* 2.0*   No results for input(s): LIPASE, AMYLASE in the last 168 hours. No results for input(s): AMMONIA in the last 168 hours. Coagulation Profile: No results for input(s): INR, PROTIME in the last 168 hours. Cardiac Enzymes: No results for input(s): CKTOTAL, CKMB, CKMBINDEX, TROPONINI in the last 168 hours. BNP (last 3 results) No results for input(s): PROBNP in the last 8760 hours. HbA1C: No results for input(s): HGBA1C in the last 72 hours. CBG: No results for input(s): GLUCAP in the last 168 hours. Lipid Profile: No results for input(s): CHOL, HDL, LDLCALC, TRIG, CHOLHDL, LDLDIRECT in the last 72 hours. Thyroid Function Tests: No results for input(s): TSH, T4TOTAL, FREET4, T3FREE, THYROIDAB in the last 72 hours. Anemia Panel:  Recent Labs  01/21/17 0601 01/22/17 0507  RETICCTPCT 22.7* >23.0*   Sepsis Labs: No results for input(s): PROCALCITON, LATICACIDVEN in the last 168 hours.  No results found for this or any previous visit (from the past 240 hour(s)).  Radiology Studies: No results found.  Scheduled Meds: . docusate sodium  100 mg Oral BID  . feeding supplement (PRO-STAT SUGAR FREE 64)  30 mL Oral BID  . folic acid  1 mg Oral Daily  . furosemide  60 mg Intravenous Daily  . gabapentin  100 mg Oral TID  .  HYDROmorphone (DILAUDID) injection  2 mg Intravenous Q2H while awake  . hydroxyurea  1,000 mg Oral Daily  . L-glutamine  5 g Oral BID  . loratadine  10 mg Oral Daily  . magnesium oxide  400 mg Oral BID  . morphine  30 mg Oral Q12H  . oxyCODONE  10 mg Oral Q4H  . polyethylene glycol  17 g Oral Daily  . potassium chloride  40  mEq Oral TID  . senna-docusate  1 tablet Oral BID   Continuous Infusions: . sodium chloride 250 mL (01/17/17 2044)  . sodium chloride 10 mL/hr at 01/09/17 1543  .  ceFAZolin (ANCEF) IV Stopped (01/22/17 1355)    LOS: 20 days   Kerney Elbe, DO [facility] Pager [phone]  If 7PM-7AM, please contact night-coverage [email or web address] Password [facility] 01/22/2017, 6:29 PM

## 2017-01-22 NOTE — Progress Notes (Signed)
CRITICAL VALUE ALERT  Critical value received:  Hemoglobin 6.1  Date of notification:  01/22/17  Time of notification:  0640  Critical value read back:yes  Nurse who received alert:  Dorothyann Peng  MD notified (1st page):  Dr. Marin Comment Time of first page:  0650  MD notified (2nd page):  Time of second page:  Responding MD:    Time MD responded:

## 2017-01-23 LAB — MAGNESIUM: Magnesium: 1.7 mg/dL (ref 1.7–2.4)

## 2017-01-23 LAB — CBC WITH DIFFERENTIAL/PLATELET
BASOS ABS: 0 10*3/uL (ref 0.0–0.1)
Basophils Relative: 1 %
EOS PCT: 6 %
Eosinophils Absolute: 0.5 10*3/uL (ref 0.0–0.7)
HEMATOCRIT: 18.1 % — AB (ref 36.0–46.0)
Hemoglobin: 6.4 g/dL — CL (ref 12.0–15.0)
Lymphocytes Relative: 25 %
Lymphs Abs: 2 10*3/uL (ref 0.7–4.0)
MCH: 34.6 pg — ABNORMAL HIGH (ref 26.0–34.0)
MCHC: 35.4 g/dL (ref 30.0–36.0)
MCV: 97.8 fL (ref 78.0–100.0)
MONO ABS: 1.2 10*3/uL — AB (ref 0.1–1.0)
MONOS PCT: 15 %
Neutro Abs: 4.4 10*3/uL (ref 1.7–7.7)
Neutrophils Relative %: 54 %
PLATELETS: 443 10*3/uL — AB (ref 150–400)
RBC: 1.85 MIL/uL — ABNORMAL LOW (ref 3.87–5.11)
RDW: 19.8 % — AB (ref 11.5–15.5)
WBC: 8 10*3/uL (ref 4.0–10.5)

## 2017-01-23 LAB — RETICULOCYTES
RBC.: 1.85 MIL/uL — ABNORMAL LOW (ref 3.87–5.11)
RETIC COUNT ABSOLUTE: 342.3 10*3/uL — AB (ref 19.0–186.0)
Retic Ct Pct: 18.5 % — ABNORMAL HIGH (ref 0.4–3.1)

## 2017-01-23 LAB — COMPREHENSIVE METABOLIC PANEL
ALBUMIN: 1.9 g/dL — AB (ref 3.5–5.0)
ALT: 12 U/L — ABNORMAL LOW (ref 14–54)
ANION GAP: 5 (ref 5–15)
AST: 99 U/L — ABNORMAL HIGH (ref 15–41)
Alkaline Phosphatase: 133 U/L — ABNORMAL HIGH (ref 38–126)
BILIRUBIN TOTAL: 8.8 mg/dL — AB (ref 0.3–1.2)
BUN: 7 mg/dL (ref 6–20)
CHLORIDE: 102 mmol/L (ref 101–111)
CO2: 27 mmol/L (ref 22–32)
Calcium: 8 mg/dL — ABNORMAL LOW (ref 8.9–10.3)
Creatinine, Ser: 0.59 mg/dL (ref 0.44–1.00)
GFR calc Af Amer: >60 mL/min (ref >60)
GLUCOSE: 83 mg/dL (ref 65–99)
POTASSIUM: 3.4 mmol/L — AB (ref 3.5–5.1)
Sodium: 134 mmol/L — ABNORMAL LOW (ref 135–145)
Total Protein: 6.4 g/dL — ABNORMAL LOW (ref 6.5–8.1)

## 2017-01-23 LAB — LACTATE DEHYDROGENASE: LDH: 231 U/L — ABNORMAL HIGH (ref 98–192)

## 2017-01-23 LAB — PHOSPHORUS: PHOSPHORUS: 3.3 mg/dL (ref 2.5–4.6)

## 2017-01-23 MED ORDER — HYDROMORPHONE HCL 2 MG/ML IJ SOLN
2.0000 mg | INTRAMUSCULAR | Status: DC
  Administered 2017-01-23 – 2017-01-24 (×9): 2 mg via INTRAVENOUS
  Filled 2017-01-23 (×9): qty 1

## 2017-01-23 NOTE — Progress Notes (Signed)
Pt up ambulating in room.

## 2017-01-23 NOTE — Progress Notes (Signed)
PROGRESS NOTE    Dawn [name]  XTG:[id number] DOB: 12/11/1980 DOA: 01/02/2017 PCP: [name], FNP    Brief Narrative:  36 year old female with history of sickle cell anemia, presented with sickle cell crisis. She's had 13 admissions in the past 6 months and 27 ED visits in the last 6 months. She was admitted for sickle cell anemia pain crisis. She was on Dilaudid PCA and was transfused 4 units of PRBCs for worsening anemia. Hospital course complicated by development of fever and evidence of staph aureus bacteremia. Port-A-Cath that was placed earlier this month has been removed. Further workup including TEE did not show any vegetations. PICC line was placed for long-term antibiotics. Discussed with Dr. Liston Alba of Sickle Cell and patient was scheduled Oxycodone and restarted on Hydroxeyurea. Patient came off the PCA 01/19/17 and was scheduled 2 mg of IV Dilaudid q2h while awake in addition to scheduled MS Contin and Oxycodone IR. She states she had a better night yesterday and patient is still being diuresed and improving slowly. Attempting to wean down Dilaudid.   Assessment & Plan:   Principal Problem:   Sickle cell crisis (HCC) Active Problems:   Sickle cell disease (HCC)   Elevated LFTs   Hyperbilirubinemia   Thrombocytopenia (HCC)   Chronic pain syndrome   Anemia of chronic disease   Bacteremia due to Staphylococcus aureus   Acute Sickle Cell Pain Crisis. -Continues to have uncontrolled pain ranging from 7-8/10 - Previously on Dilaudid PCA and titrated off.  -patient would like to continue diluadid q2h for the time being -She is continued on her long-acting narcotics with Oxycodone 10 mg po q4hprn for Moderate Pain -C/w MS Contin to 30 mg po q12h (discussed increased to q8h dosing but patient declines this) -C/w 10 mg of Oxycodone q4h and with Chemotherapy 100 TID -previous provider discussed Case with Dr. Liston Alba who recommended starting back on 1000 mg of  Hydroxyurea and recommended against transfusion at this point.  -Continue to Ambulate  Sickle Cell Anemia -She did receive 2 units of PRBCs and 4/5 with improvement of hemoglobin. She received another 2 units on 4/19 in preparation for surgery for Removal of Port-A-Cath.  -Hb/Hct went from 5.9/17.1 -> 5.7/16.2 -> 7.2/20.3 -> 6.2/18.1 -> 6.4/18.2 -> 6.1/17.6 -LDH was 234; Reticulocyte Count Percentage was 18.5, retic count 342.3 -Patient did not get 2 units of Blood that were ordered for her 01/16/17; Infusion was stopped prior to any blood being transfused -C/w 1000 mg of Hydroxyurea at this time based on Dr. Zigmund Daniel Recommendation to increase HbF  Staph Aureus Bacteremia.  -Blood cultures returned positive for MSSA bacteremia. -Discussed with Infectious Disease who recommended removal of Port-A-Cath which was completed on 4/20. -Catheter tip culture also positive for staph aureus. -Repeat blood cultures drawn on 4/19 have shownno growth.  -TEE did not show any evidence of vegetations and MRI L spine did not show infection/discitis.  -Since this is a recurrent bacteremia(last episode was in 10/2016), she will need 4 weeks of IV antibiotics to be completed on 5/17.   -She is currently on IV Cefazolin 2 gram q8h and will continue -Once antibiotics are complete, she should have repeat blood cultures drawn on 5/22 to ensure that bacteremia has cleared, prior to any further port placement.   Thrombocytopenia and now Thrombocytosis -Possibly related to crisis. Improved as Plts went from 86 -> 439 -Repeat CBC in AM  Chronic Hyperbilirubinemia -T Bili went from 16.7 -> 13.7 -> 11.8 -> 9.7 ->  12.2 -> 10.4 -> 9.5 -> 9.1-> 8.8 -Likely secondary to hemolysis from Sickle Cell.  -No abdominal pain, nausea or vomiting. Continue to monitor.  Lower Extremity Edema/Anasarca, improving - net -372ml since admission - Likely related to aggressive hydration and hypoalbuminemia.  -Checked Venous  Ultrasound for DVT as Right leg is bigger than Left and showed no Evidence of DVT of Bilateral Lower Extremities -Continue IV Lasix 60 mg this AM -C/w Compression stockings for patient  - monitor I/O's  AKI, Improved -appears to have resolved - Cr of 0.59 today  Hypoalbuminemia.  -Albumin Level was 1.9 this AM -Nutrition consult -C/w Prostat Feeding Supplement 30 mL po BID  Hypokalemia -Patient's K+ was 3.4 this AM -Replete with Potassium Chloride 40 mEQ po TID while being diuresed  Hypomagnesemia -Mag Level was 1.7 this am -C/w po Mag Oxide 400 mg po BID at the recommendation of Dr. Zigmund Daniel  Abnormal LFT's/Elevated AST -AST this AM was 99 (98) and ALT was 12 (11); -Continue to Trend  Leukocytosis, improved -Likely reactive to pain and Sickle Cell  -WBC went from 17.9 -> 15.4 -> 12.9 -> 10.4 -> 11.6 -> 11.1 -> 11.0 -> 9.3-> 8.0 -On Abx with Cefazolin 2 grams IV q8h -Repeat CBC on Sunday and continue to Monitor  DVT prophylaxis: SCDs Code Status: FULL CODE Family Communication: No Family present at bedside Disposition Plan: Home when medically stable for D/C likely in 1-2 days  Consultants:   Cardiology for TEE  General Surgery for Port-A-Cath Removal  Infectious Diseases via Phone Consultation   Dr. Michelle [name] of Sickle Cell  Procedures:  2D echo: - Left ventricle: The cavity size was normal. Wall thickness was normal. Systolic function was normal. The estimated ejection fraction was in the range of 60% to 65%. Wall motion was normal; there were no regional wall motion abnormalities. The study is not technically sufficient to allow evaluation of LV diastolic function. - Aortic valve: Valve area (VTI): 2.19 cm^2. Valve area (Vmax): 2.23 cm^2. Valve area (Vmean): 2.14 cm^2. - Left atrium: The atrium was moderately dilated. - Right atrium: The atrium was mildly dilated. - Pulmonary arteries: Systolic pressure was moderately  increased. PA peak pressure: 37 mm Hg (S). - Pericardium, extracardiac: There is a small circumferential pericardial effusion.  - Technically adequate study.   TEE: No evidence of endocarditis. There was no evidence of a  vegetation.    Removal of portacath by Dr. Jenkins on 4/20  Placement of femoral central line by Dr. Jenkins on 4/20   Antimicrobials:   Ancef 4/18>  Flagyl 4/12>    Subjective: Patient reports her pain as 8/10 consistently.  She does not feel ready to increase interval of diluadid.    Objective: Vitals:   01/22/17 0500 01/22/17 1300 01/22/17 2110 01/23/17 0644  BP: 138/86 (!) 152/95 124/70 111/61  Pulse: 100 98 (!) 102 100  Resp: 16 18 16 18  Temp: 99 F (37.2 C) 99 F (37.2 C)  98.8 F (37.1 C)  TempSrc: Oral Oral Oral Oral  SpO2: 98% 98% 99% 100%  Weight: 79.2 kg (174 lb 8 oz)   77.7 kg (171 lb 4.8 oz)  Height:        Intake/Output Summary (Last 24 hours) at 01/23/17 1613 Last data filed at 01/23/17 0800  Gross per 24 hour  Intake              80 0 ml  Output  1400 ml  Net             -600 ml   Filed Weights   01/21/17 0609 01/22/17 0500 01/23/17 0644  Weight: 79.1 kg (174 lb 6.4 oz) 79.2 kg (174 lb 8 oz) 77.7 kg (171 lb 4.8 oz)    Examination:  General exam: Appears calm and comfortable  Respiratory system: Clear to auscultation. Respiratory effort normal. Cardiovascular system: S1 & S2 heard, RRR. No JVD, murmurs, rubs, gallops or clicks. No pedal edema. Gastrointestinal system: Abdomen is nondistended, soft and nontender. No organomegaly or masses felt. Normal bowel sounds heard. Central nervous system: Alert and oriented. No focal neurological deficits. Extremities: Symmetric 5 x 5 power. Skin: No rashes, lesions or ulcers Psychiatry: Judgement and insight appear normal. Mood & affect appropriate.     Data Reviewed: I have personally reviewed following labs and imaging studies  CBC:  Recent Labs Lab  01/19/17 1155 01/20/17 0629 01/21/17 0601 01/22/17 0507 01/23/17 0607  WBC 11.6* 11.1* 11.0* 9.3 8.0  NEUTROABS 7.5 7.5 8.1* 5.2 4.4  HGB 7.2* 6.2* 6.4* 6.1* 6.4*  HCT 20.3* 18.1* 18.2* 17.6* 18.1*  MCV 97.1 97.3 97.8 97.2 97.8  PLT 439* 491* 464* 439* 824*   Basic Metabolic Panel:  Recent Labs Lab 01/19/17 1155 01/20/17 0629 01/21/17 0601 01/22/17 0507 01/23/17 0607  NA 140 137 137 135 134*  K 4.0 2.8* 3.4* 3.4* 3.4*  CL 102 100* 103 101 102  CO2 31 31 29 28 27   GLUCOSE 93 91 122* 79 83  BUN 6 8 7 7 7   CREATININE 0.40* 0.63 0.63 0.59 0.59  CALCIUM 8.1* 7.7* 7.8* 7.9* 8.0*  MG 1.8 1.7 1.7 1.8 1.7  PHOS 2.9 3.9 3.0 3.3 3.3   GFR: Estimated Creatinine Clearance: 95.9 mL/min (by C-G formula based on SCr of 0.59 mg/dL). Liver Function Tests:  Recent Labs Lab 01/19/17 1155 01/20/17 0629 01/21/17 0601 01/22/17 0507 01/23/17 0607  AST 135* 96* 98* 99* 99*  ALT 16 12* 11* 12* 12*  ALKPHOS 139* 128* 126 134* 133*  BILITOT 12.2* 10.4* 9.5* 9.1* 8.8*  PROT 7.0 6.2* 6.3* 6.4* 6.4*  ALBUMIN 2.3* 2.0* 2.0* 2.0* 1.9*   No results for input(s): LIPASE, AMYLASE in the last 168 hours. No results for input(s): AMMONIA in the last 168 hours. Coagulation Profile: No results for input(s): INR, PROTIME in the last 168 hours. Cardiac Enzymes: No results for input(s): CKTOTAL, CKMB, CKMBINDEX, TROPONINI in the last 168 hours. BNP (last 3 results) No results for input(s): PROBNP in the last 8760 hours. HbA1C: No results for input(s): HGBA1C in the last 72 hours. CBG: No results for input(s): GLUCAP in the last 168 hours. Lipid Profile: No results for input(s): CHOL, HDL, LDLCALC, TRIG, CHOLHDL, LDLDIRECT in the last 72 hours. Thyroid Function Tests: No results for input(s): TSH, T4TOTAL, FREET4, T3FREE, THYROIDAB in the last 72 hours. Anemia Panel:  Recent Labs  01/22/17 0507 01/23/17 0607  RETICCTPCT >23.0* 18.5*   Sepsis Labs: No results for input(s): PROCALCITON,  LATICACIDVEN in the last 168 hours.  No results found for this or any previous visit (from the past 240 hour(s)).       Radiology Studies: No results found.      Scheduled Meds: . docusate sodium  100 mg Oral BID  . feeding supplement (PRO-STAT SUGAR FREE 64)  30 mL Oral BID  . folic acid  1 mg Oral Daily  . furosemide  60 mg Intravenous Daily  . gabapentin  100 mg Oral TID  .  HYDROmorphone (DILAUDID) injection  2 mg Intravenous Q2H while awake  . hydroxyurea  1,000 mg Oral Daily  . L-glutamine  5 g Oral BID  . loratadine  10 mg Oral Daily  . magnesium oxide  400 mg Oral BID  . morphine  30 mg Oral Q12H  . oxyCODONE  10 mg Oral Q4H  . polyethylene glycol  17 g Oral Daily  . potassium chloride  40 mEq Oral TID  . senna-docusate  1 tablet Oral BID   Continuous Infusions: . sodium chloride 250 mL (01/17/17 2044)  . sodium chloride 10 mL/hr at 01/09/17 1543  .  ceFAZolin (ANCEF) IV 2 g (01/23/17 1448)     LOS: 21 days    Time spent: 30 minutes    Loretha Stapler, MD [facility] Pager [phone]  If 7PM-7AM, please contact night-coverage [email or web address] Password [facility] 01/23/2017, 4:13 PM

## 2017-01-24 LAB — BASIC METABOLIC PANEL
Anion gap: 6 (ref 5–15)
BUN: 7 mg/dL (ref 6–20)
CHLORIDE: 103 mmol/L (ref 101–111)
CO2: 26 mmol/L (ref 22–32)
Calcium: 7.8 mg/dL — ABNORMAL LOW (ref 8.9–10.3)
Creatinine, Ser: 0.67 mg/dL (ref 0.44–1.00)
Glucose, Bld: 100 mg/dL — ABNORMAL HIGH (ref 65–99)
POTASSIUM: 2.9 mmol/L — AB (ref 3.5–5.1)
SODIUM: 135 mmol/L (ref 135–145)

## 2017-01-24 MED ORDER — HYDROMORPHONE HCL 2 MG/ML IJ SOLN: 2.0000 mg | INTRAMUSCULAR | Status: DC | PRN

## 2017-01-24 MED ORDER — FUROSEMIDE 10 MG/ML IJ SOLN: 40.0000 mg | Freq: Every day | INTRAMUSCULAR | Status: DC

## 2017-01-24 MED ORDER — HYDROMORPHONE HCL 2 MG/ML IJ SOLN
2.0000 mg | INTRAMUSCULAR | Status: DC | PRN
  Administered 2017-01-24 – 2017-01-25 (×4): 2 mg via INTRAVENOUS
  Filled 2017-01-24 (×4): qty 1

## 2017-01-24 MED ORDER — POTASSIUM CHLORIDE 10 MEQ/100ML IV SOLN
10.0000 meq | INTRAVENOUS | Status: AC
  Administered 2017-01-24 (×3): 10 meq via INTRAVENOUS
  Filled 2017-01-24 (×3): qty 100

## 2017-01-24 NOTE — Care Management Note (Signed)
Case Management Note  Patient Details  Name: Dawn [name] MRN: [id number] Date of Birth: [birth year]   If discussed at [place] Length of Stay Meetings, dates discussed:  01/24/2017 Additional Comments:  Khylin Gutridge, Chauncey Reading, RN 01/24/2017, 3:35 PM

## 2017-01-24 NOTE — Care Management (Signed)
CM discussed cost of IV antibiotics when patient discharges. Cost will be $34 dollars per day. Patient is applying for Medicaid with the help of Financial Counselor. Patient plans to go home when discharged and cover cost OOP. Declines SNF placement for IV Antibiotics. [facility] will be provider of IV antibiotics at home.

## 2017-01-24 NOTE — Care Management Important Message (Signed)
Important Message  Patient Details  Name: JULES VIDOVICH MRN: [id number] Date of Birth: 08-09-1981   Medicare Important Message Given:  Yes    Ifeanyi Mickelson, Chauncey Reading, RN 01/24/2017, 3:35 PM

## 2017-01-24 NOTE — Progress Notes (Addendum)
PROGRESS NOTE    Dawn [name]  BDZ:[id number] DOB: 1981-03-31 DOA: 01/02/2017 PCP: [name], FNP    Brief Narrative:  36 year old female with history of sickle cell anemia, presented with sickle cell crisis. She's had 13 admissions in the past 6 months and 27 ED visits in the last 6 months. She was admitted for sickle cell anemia pain crisis. She was on Dilaudid PCA and was transfused 4 units of PRBCs for worsening anemia. Hospital course complicated by development of fever and evidence of staph aureus bacteremia. Port-A-Cath that was placed earlier this month has been removed. Further workup including TEE did not show any vegetations. PICC line was placed for long-term antibiotics. Discussed with Dr. Liston Alba of Sickle Cell and patient was scheduled Oxycodone and restarted on Hydroxeyurea. Patient came off the PCA 01/19/17 and was scheduled 2 mg of IV Dilaudid q2h while awake in addition to scheduled MS Contin and Oxycodone IR. She states she had a better night yesterday and patient is still being diuresed and improving slowly. Attempting to wean down Dilaudid.   Assessment & Plan:   Principal Problem:   Sickle cell crisis (HCC) Active Problems:   Sickle cell disease (HCC)   Elevated LFTs   Hyperbilirubinemia   Thrombocytopenia (HCC)   Chronic pain syndrome   Anemia of chronic disease   Bacteremia due to Staphylococcus aureus   Acute Sickle Cell Pain Crisis. -Continues to have uncontrolled pain ranging from 7-8/10 - will change diluadid to q4h PRN - MS Contin to 30 mg po q12h - 10 mg of Oxycodone q4h - hemoglobin >6 with robust reticulocytosis, reticulocyte production index 3.0 (based on yesterday's lab- appropriate for degree of anemia, expect hemoglobin to increase in the next several days) - continuing hydroxyurea as manual reticulocyte count >100  - encouraged patient to ambulate as tolerated  Sickle Cell Anemia - previous blood transfusions during  admission -C/w 1000 mg of Hydroxyurea at this time based on Dr. Zigmund Daniel Recommendation to increase HbF  Staph Aureus Bacteremia.  -Blood cultures returned positive for MSSA bacteremia. - previously discussed with Infectious Disease who recommended removal of Port-A-Cath which was completed on 4/20. -Catheter tip culture also positive for staph aureus. -Repeat blood cultures drawn on 4/19 have shownno growth.  -TEE did not show any evidence of vegetations and MRI L spine did not show infection/discitis.  - recurrent bacteremia(last episode was in 10/2016), she will need 4 weeks of IV antibiotics to be completed on 5/17.   - IV Cefazolin 2 gram q8h and will continue -Once antibiotics are complete- repeat blood cultures drawn on 5/22 to ensure that bacteremia has cleared, prior to any further port placement.   Thrombocytopenia and now Thrombocytosis -Possibly related to crisis. Improved as Plts went from 86 -> 439 -Repeat CBC in AM  Chronic Hyperbilirubinemia -T Bili went from 16.7 -> 13.7 -> 11.8 -> 9.7 -> 12.2 -> 10.4 -> 9.5 -> 9.1-> 8.8 -Likely secondary to hemolysis from Sickle Cell.  -No abdominal pain, nausea or vomiting. Continue to monitor.  Lower Extremity Edema/Anasarca, improving - net -2659ml since admission - Likely related to aggressive hydration and hypoalbuminemia.  - Venous Ultrasound for DVT as Right leg is bigger than Left and showed no Evidence of DVT of Bilateral Lower Extremities -Continue IV Lasix 60 mg daily today and transition to PO tomorrow -C/w Compression stockings for patient  - monitor I/O's  AKI, Improved -appears to have resolved - Cr of 0.59 today  Hypoalbuminemia.  -Albumin Level was  1.9 this AM -C/w Prostat Feeding Supplement 30 mL po BID  Hypokalemia -Patient's K+ 2.9 this AM -Replete with Potassium Chloride 40 mEQ po TID while being diuresed - give additional 37mEq x 3 today IV  Hypomagnesemia -Mag Level was previously  1.7 -C/w po Mag Oxide 400 mg po BID at the recommendation of Dr. Zigmund Daniel  Abnormal LFT's/Elevated AST -AST this AM was 99 (98) and ALT was 12 (11); -Continue to Trend  Leukocytosis, improved -WBC went from 17.9 -> 15.4 -> 12.9 -> 10.4 -> 11.6 -> 11.1 -> 11.0 -> 9.3-> 8.0 - Cefazolin 2 grams IV q8h  DVT prophylaxis: SCDs Code Status: FULL CODE Family Communication: No Family present at bedside Disposition Plan: hopefully discharge in next 1-2 days when pain better controlled  Consultants:   Cardiology for TEE  General Surgery for Port-A-Cath Removal  Infectious Diseases via Phone Consultation   Dr. Liston Alba of Sickle Cell  Procedures:  2D echo: - Left ventricle: The cavity size was normal. Wall thickness was normal. Systolic function was normal. The estimated ejection fraction was in the range of 60% to 65%. Wall motion was normal; there were no regional wall motion abnormalities. The study is not technically sufficient to allow evaluation of LV diastolic function. - Aortic valve: Valve area (VTI): 2.19 cm^2. Valve area (Vmax): 2.23 cm^2. Valve area (Vmean): 2.14 cm^2. - Left atrium: The atrium was moderately dilated. - Right atrium: The atrium was mildly dilated. - Pulmonary arteries: Systolic pressure was moderately increased. PA peak pressure: 37 mm Hg (S). - Pericardium, extracardiac: There is a small circumferential pericardial effusion.  - Technically adequate study.   TEE: No evidence of endocarditis. There was no evidence of a  vegetation.    Removal of portacath by Dr. Arnoldo Morale on 4/20  Placement of femoral central line by Dr. Arnoldo Morale on 4/20   Antimicrobials:   Ancef 4/18>  Flagyl 4/12>    Subjective: Patient says she feels her legs have gone down in swelling but is still not back to baseline.  She reports improvement in her pain yesterday down to a 5 after diluadid but reports that she does not feel she can back  off on pain medication at this time.    Objective: Vitals:   01/22/17 2110 01/23/17 0644 01/23/17 2143 01/24/17 0500  BP: 124/70 111/61 130/68 125/69  Pulse: (!) 102 100 (!) 102 97  Resp: 16 18 18 18   Temp:  98.8 F (37.1 C) 98.7 F (37.1 C) 98.6 F (37 C)  TempSrc: Oral Oral Oral Oral  SpO2: 99% 100% 96% 95%  Weight:  77.7 kg (171 lb 4.8 oz)  78 kg (171 lb 14.4 oz)  Height:        Intake/Output Summary (Last 24 hours) at 01/24/17 1437 Last data filed at 01/24/17 0900  Gross per 24 hour  Intake              960 ml  Output             1150 ml  Net             -190 ml   Filed Weights   01/22/17 0500 01/23/17 0644 01/24/17 0500  Weight: 79.2 kg (174 lb 8 oz) 77.7 kg (171 lb 4.8 oz) 78 kg (171 lb 14.4 oz)    Examination:  General exam: Appears calm and comfortable  Respiratory system: Clear to auscultation. Respiratory effort normal. Cardiovascular system: S1 & S2 heard, RRR. No JVD, murmurs, rubs, gallops  or clicks. No pedal edema but significant tenderness to palpation of lower extremities bilaterally Gastrointestinal system: Abdomen is nondistended, soft and nontender. No organomegaly or masses felt. Normal bowel sounds heard. Central nervous system: Alert and oriented. No focal neurological deficits. Extremities: Symmetric 5 x 5 power, tenderness to palpation of lower extremities bilaterally Skin: No rashes, lesions or ulcers Psychiatry: Judgement and insight appear normal. Mood & affect appropriate.     Data Reviewed: I have personally reviewed following labs and imaging studies  CBC:  Recent Labs Lab 01/19/17 1155 01/20/17 0629 01/21/17 0601 01/22/17 0507 01/23/17 0607  WBC 11.6* 11.1* 11.0* 9.3 8.0  NEUTROABS 7.5 7.5 8.1* 5.2 4.4  HGB 7.2* 6.2* 6.4* 6.1* 6.4*  HCT 20.3* 18.1* 18.2* 17.6* 18.1*  MCV 97.1 97.3 97.8 97.2 97.8  PLT 439* 491* 464* 439* 401*   Basic Metabolic Panel:  Recent Labs Lab 01/19/17 1155 01/20/17 0629 01/21/17 0601  01/22/17 0507 01/23/17 0607 01/24/17 1039  NA 140 137 137 135 134* 135  K 4.0 2.8* 3.4* 3.4* 3.4* 2.9*  CL 102 100* 103 101 102 103  CO2 31 31 29 28 27 26   GLUCOSE 93 91 122* 79 83 100*  BUN 6 8 7 7 7 7   CREATININE 0.40* 0.63 0.63 0.59 0.59 0.67  CALCIUM 8.1* 7.7* 7.8* 7.9* 8.0* 7.8*  MG 1.8 1.7 1.7 1.8 1.7  --   PHOS 2.9 3.9 3.0 3.3 3.3  --    GFR: Estimated Creatinine Clearance: 96.1 mL/min (by C-G formula based on SCr of 0.67 mg/dL). Liver Function Tests:  Recent Labs Lab 01/19/17 1155 01/20/17 0629 01/21/17 0601 01/22/17 0507 01/23/17 0607  AST 135* 96* 98* 99* 99*  ALT 16 12* 11* 12* 12*  ALKPHOS 139* 128* 126 134* 133*  BILITOT 12.2* 10.4* 9.5* 9.1* 8.8*  PROT 7.0 6.2* 6.3* 6.4* 6.4*  ALBUMIN 2.3* 2.0* 2.0* 2.0* 1.9*   No results for input(s): LIPASE, AMYLASE in the last 168 hours. No results for input(s): AMMONIA in the last 168 hours. Coagulation Profile: No results for input(s): INR, PROTIME in the last 168 hours. Cardiac Enzymes: No results for input(s): CKTOTAL, CKMB, CKMBINDEX, TROPONINI in the last 168 hours. BNP (last 3 results) No results for input(s): PROBNP in the last 8760 hours. HbA1C: No results for input(s): HGBA1C in the last 72 hours. CBG: No results for input(s): GLUCAP in the last 168 hours. Lipid Profile: No results for input(s): CHOL, HDL, LDLCALC, TRIG, CHOLHDL, LDLDIRECT in the last 72 hours. Thyroid Function Tests: No results for input(s): TSH, T4TOTAL, FREET4, T3FREE, THYROIDAB in the last 72 hours. Anemia Panel:  Recent Labs  01/22/17 0507 01/23/17 0607  RETICCTPCT >23.0* 18.5*   Sepsis Labs: No results for input(s): PROCALCITON, LATICACIDVEN in the last 168 hours.  No results found for this or any previous visit (from the past 240 hour(s)).       Radiology Studies: No results found.      Scheduled Meds: . docusate sodium  100 mg Oral BID  . feeding supplement (PRO-STAT SUGAR FREE 64)  30 mL Oral BID  . folic  acid  1 mg Oral Daily  . furosemide  60 mg Intravenous Daily  . gabapentin  100 mg Oral TID  .  HYDROmorphone (DILAUDID) injection  2 mg Intravenous Q2H while awake  . hydroxyurea  1,000 mg Oral Daily  . L-glutamine  5 g Oral BID  . loratadine  10 mg Oral Daily  . magnesium oxide  400 mg Oral  BID  . morphine  30 mg Oral Q12H  . oxyCODONE  10 mg Oral Q4H  . polyethylene glycol  17 g Oral Daily  . potassium chloride  40 mEq Oral TID  . senna-docusate  1 tablet Oral BID   Continuous Infusions: . sodium chloride 250 mL (01/17/17 2044)  . sodium chloride 10 mL/hr at 01/09/17 1543  .  ceFAZolin (ANCEF) IV 2 g (01/24/17 1414)     LOS: 22 days    Time spent: 30 minutes    Loretha Stapler, MD [facility] Pager [phone]  If 7PM-7AM, please contact night-coverage [email or web address] Password [facility] 01/24/2017, 2:37 PM

## 2017-01-25 DIAGNOSIS — Z452 Encounter for adjustment and management of vascular access device: Secondary | ICD-10-CM | POA: Diagnosis not present

## 2017-01-25 DIAGNOSIS — D638 Anemia in other chronic diseases classified elsewhere: Secondary | ICD-10-CM | POA: Diagnosis not present

## 2017-01-25 DIAGNOSIS — B9561 Methicillin susceptible Staphylococcus aureus infection as the cause of diseases classified elsewhere: Secondary | ICD-10-CM | POA: Diagnosis not present

## 2017-01-25 DIAGNOSIS — D5700 Hb-SS disease with crisis, unspecified: Secondary | ICD-10-CM | POA: Diagnosis not present

## 2017-01-25 DIAGNOSIS — G894 Chronic pain syndrome: Secondary | ICD-10-CM | POA: Diagnosis not present

## 2017-01-25 DIAGNOSIS — T827XXA Infection and inflammatory reaction due to other cardiac and vascular devices, implants and grafts, initial encounter: Secondary | ICD-10-CM | POA: Diagnosis not present

## 2017-01-25 LAB — BASIC METABOLIC PANEL
Anion gap: 5 (ref 5–15)
BUN: 8 mg/dL (ref 6–20)
CALCIUM: 8 mg/dL — AB (ref 8.9–10.3)
CO2: 25 mmol/L (ref 22–32)
Chloride: 103 mmol/L (ref 101–111)
Creatinine, Ser: 0.53 mg/dL (ref 0.44–1.00)
GFR calc non Af Amer: >60 mL/min (ref >60)
GLUCOSE: 104 mg/dL — AB (ref 65–99)
Potassium: 3.3 mmol/L — ABNORMAL LOW (ref 3.5–5.1)
Sodium: 133 mmol/L — ABNORMAL LOW (ref 135–145)

## 2017-01-25 LAB — CBC WITH DIFFERENTIAL/PLATELET
BASOS PCT: 1 %
Basophils Absolute: 0 10*3/uL (ref 0.0–0.1)
EOS ABS: 0.5 10*3/uL (ref 0.0–0.7)
Eosinophils Relative: 6 %
HCT: 18.6 % — ABNORMAL LOW (ref 36.0–46.0)
Hemoglobin: 6.6 g/dL — CL (ref 12.0–15.0)
Lymphocytes Relative: 26 %
Lymphs Abs: 1.9 10*3/uL (ref 0.7–4.0)
MCH: 34.4 pg — ABNORMAL HIGH (ref 26.0–34.0)
MCHC: 35.5 g/dL (ref 30.0–36.0)
MCV: 96.9 fL (ref 78.0–100.0)
MONO ABS: 1 10*3/uL (ref 0.1–1.0)
MONOS PCT: 13 %
Neutro Abs: 4 10*3/uL (ref 1.7–7.7)
Neutrophils Relative %: 54 %
Platelets: 378 10*3/uL (ref 150–400)
RBC: 1.92 MIL/uL — ABNORMAL LOW (ref 3.87–5.11)
RDW: 20.2 % — AB (ref 11.5–15.5)
WBC: 7.3 10*3/uL (ref 4.0–10.5)

## 2017-01-25 MED ORDER — FUROSEMIDE 40 MG PO TABS
40.0000 mg | ORAL_TABLET | Freq: Every day | ORAL | Status: DC
  Administered 2017-01-25: 40 mg via ORAL
  Filled 2017-01-25: qty 1

## 2017-01-25 MED ORDER — MAGNESIUM OXIDE 400 (241.3 MG) MG PO TABS: 400.0000 mg | ORAL_TABLET | Freq: Two times a day (BID) | ORAL | 0 refills | Status: AC

## 2017-01-25 MED ORDER — CEFAZOLIN SODIUM-DEXTROSE 2-4 GM/100ML-% IV SOLN: 2.0000 g | Freq: Three times a day (TID) | INTRAVENOUS | 0 refills | Status: AC

## 2017-01-25 MED ORDER — FUROSEMIDE 40 MG PO TABS
20.0000 mg | ORAL_TABLET | Freq: Every day | ORAL | 0 refills | Status: AC
Start: 2017-01-26 — End: ?

## 2017-01-25 MED ORDER — CEFAZOLIN SODIUM-DEXTROSE 2-4 GM/100ML-% IV SOLN
INTRAVENOUS | Status: AC
  Filled 2017-01-25: qty 100

## 2017-01-25 NOTE — Discharge Summary (Addendum)
Physician Discharge Summary  Dawn [name] HUD:[id number] DOB: 02/26/1981 DOA: 01/02/2017  PCP: [name], FNP  Admit date: 01/02/2017 Discharge date: 01/25/2017  Admitted From: Home  Disposition:  Home   Recommendations for Outpatient Follow-up:  1. Follow up with PCP in 1-2 weeks 2. Antibiotics as prescribed 3. Take medications as prescribed 4. Please obtain BMP/CBC in one week  Home Health: Yes- RN, Aide Equipment/Devices: PICC line  Discharge Condition: Stable CODE STATUS:Full Code Diet recommendation: Regular diet  Brief/Interim Summary: 36 year old female with history of sickle cell anemia, presented with sickle cell crisis. She's had 13 admissions in the past 6 months and 27 ED visits in the last 6 months. She was admitted for sickle cell anemia pain crisis. She was on Dilaudid PCA and was transfused 4 units of PRBCs for worsening anemia. Hospital course complicated by development of fever and evidence of staph aureus bacteremia. Port-A-Cath that was placed earlier this month has been removed. Further workup including TEE did not show any vegetations. PICC line was placed for long-term antibiotics. Discussed with Dr. Liston Alba of Sickle Cell and patient was scheduled Oxycodone and restarted on Hydroxeyurea. Patient came off the PCA 01/19/17 and was scheduled 2mg  of IV Dilaudid q2h while awake in addition to scheduled MS Contin and Oxycodone IR. She states she had a better night yesterday and patient is still being diuresed and improving slowly. Attempting to wean down Dilaudid.  Discharge Diagnoses:  Principal Problem:   Sickle cell crisis (HCC) Active Problems:   Sickle cell disease (HCC)   Elevated LFTs   Hyperbilirubinemia   Thrombocytopenia (HCC)   Chronic pain syndrome   Anemia of chronic disease   Bacteremia due to Staphylococcus aureus  Acute Sickle Cell Pain Crisis. - continue home pain regimen - patient and provider discussed that she may have some pain  from receiving high dose opiates for hospitalization  Sickle Cell Anemia -C/w 1000 mg of Hydroxyurea  Staph Aureus Bacteremia.  - recurrent bacteremia(last episode was in 10/2016), she will need 4 weeks of IV antibiotics to be completed on 5/17.  - IV Cefazolin 2 gram q8h and will continue -Once antibiotics are complete- repeat blood cultures drawn on 5/22 to ensure that bacteremia has cleared, prior to any further port placement.   Chronic Hyperbilirubinemia - repeat LFT within 2 weeks  Lower Extremity Edema/Anasarca, improving - improved - home with lasix 20mg  daily  AKI, Improved -resolved - repeat BMP within 2 weeks  Hypoalbuminemia.  -C/w Prostat Feeding Supplement 30 mL po BID outpatient  Hypokalemia - PO potassium supplement prescribed - repeat BMP within 1 week  Hypomagnesemia -C/w po Mag Oxide 400 mg po BID  - repeat Mg level within 1 week   Discharge Instructions  Discharge Instructions    Call MD for:  difficulty breathing, headache or visual disturbances    Complete by:  As directed    Call MD for:  extreme fatigue    Complete by:  As directed    Call MD for:  hives    Complete by:  As directed    Call MD for:  persistant dizziness or light-headedness    Complete by:  As directed    Call MD for:  persistant nausea and vomiting    Complete by:  As directed    Call MD for:  severe uncontrolled pain    Complete by:  As directed    Call MD for:  temperature >100.4    Complete by:  As directed  Diet general    Complete by:  As directed    Discharge instructions    Complete by:  As directed    1. Antibiotics as prescribed 2. Home health set up for you 3. Medicaid application needs to be completed   Increase activity slowly    Complete by:  As directed      Allergies as of 01/25/2017      Reactions   Desferal [deferoxamine] Hives   Local reaction on arm only during infusion. Can take with benadryl    Latex Other (See Comments)    REACTION: Pt experiences a burning sensation on contacted skin areas   Lisinopril Other (See Comments), Cough   REACTION: Sore/scratchy throat   Tape Other (See Comments)   REACTION: Pt. Experiences a burning sensation on contacted skin areas   Tylenol [acetaminophen] Other (See Comments)   Pt. Does not take the following due to protection of kidney health. Past occurrence of Protein in urine.      Medication List    STOP taking these medications   metroNIDAZOLE 500 MG tablet Commonly known as:  FLAGYL   sulfamethoxazole-trimethoprim 800-160 MG tablet Commonly known as:  BACTRIM DS,SEPTRA DS     TAKE these medications   aspirin 81 MG EC tablet Take 1 tablet (81 mg total) by mouth daily.   ceFAZolin 2-4 GM/100ML-% IVPB Commonly known as:  ANCEF Inject 100 mLs (2 g total) into the vein every 8 (eight) hours.   folic acid 1 MG tablet Commonly known as:  FOLVITE Take 1 tablet (1 mg total) by mouth daily.   furosemide 40 MG tablet Commonly known as:  LASIX Take 0.5 tablets (20 mg total) by mouth daily. Start taking on:  01/26/2017   gabapentin 100 MG capsule Commonly known as:  NEURONTIN Take 1 capsule (100 mg total) by mouth 3 (three) times daily.   hydroxyurea 500 MG capsule Commonly known as:  HYDREA Take 2 capsules (1,000 mg total) by mouth daily. May take with food to minimize GI side effects.   L-glutamine 5 g Pack Powder Packet Commonly known as:  ENDARI Take 5 g by mouth 2 (two) times daily.   levonorgestrel 20 MCG/24HR IUD Commonly known as:  MIRENA 1 each by Intrauterine route once.   loratadine 10 MG tablet Commonly known as:  CLARITIN Take 10 mg by mouth daily.   magnesium oxide 400 (241.3 Mg) MG tablet Commonly known as:  MAG-OX Take 1 tablet (400 mg total) by mouth 2 (two) times daily.   morphine 15 MG 12 hr tablet Commonly known as:  MS CONTIN Take 1 tablet (15 mg total) by mouth every 12 (twelve) hours.   multivitamin with minerals Tabs  tablet Take 1 tablet by mouth daily.   Oxycodone HCl 10 MG Tabs Take 1 tablet (10 mg total) by mouth every 4 (four) hours as needed. What changed:  additional instructions   potassium chloride 10 MEQ tablet Commonly known as:  K-DUR Take 1 tablet (10 mEq total) by mouth daily.   promethazine 12.5 MG tablet Commonly known as:  PHENERGAN Take 1 tablet (12.5 mg total) by mouth every 6 (six) hours as needed for nausea or vomiting.   Vitamin D (Ergocalciferol) 50000 units Caps capsule Commonly known as:  DRISDOL Take 1 capsule (50,000 Units total) by mouth every 7 (seven) days. Takes on Sundays.      Follow-up Information    [name], FNP. Schedule an appointment as soon as possible for a visit  in 1 week(s).   Specialty:  Family Medicine Contact information: [address]. [place] [address] [phone]          Allergies  Allergen Reactions  . Desferal [Deferoxamine] Hives    Local reaction on arm only during infusion. Can take with benadryl   . Latex Other (See Comments)    REACTION: Pt experiences a burning sensation on contacted skin areas  . Lisinopril Other (See Comments) and Cough    REACTION: Sore/scratchy throat  . Tape Other (See Comments)    REACTION: Pt. Experiences a burning sensation on contacted skin areas  . Tylenol [Acetaminophen] Other (See Comments)    Pt. Does not take the following due to protection of kidney health. Past occurrence of Protein in urine.    Consultations:  Infectious Disease  Cardiology  General Surgery  Dr. Liston Alba of Sickle Cell   Procedures/Studies: Dg Chest 2 View  Result Date: 01/04/2017 CLINICAL DATA:  Sickle cell disease, shortness of breath EXAM: CHEST  2 VIEW COMPARISON:  12/09/2016 FINDINGS: There is a right-sided Port-A-Cath with the tip projecting over the SVC. There is no focal parenchymal opacity. There is no pleural effusion or pneumothorax. The heart and mediastinal contours  are unremarkable. The osseous structures are unremarkable. IMPRESSION: No active cardiopulmonary disease. Electronically Signed   By: Kathreen Devoid   On: 01/04/2017 11:24   Mr Lumbar Spine Wo Contrast  Result Date: 01/10/2017 CLINICAL DATA:  Persistent low back pain. Fever and white count. Assess for spinal infection. EXAM: MRI LUMBAR SPINE WITHOUT CONTRAST TECHNIQUE: Multiplanar, multisequence MR imaging of the lumbar spine was performed. No intravenous contrast was administered. COMPARISON:  None. FINDINGS: Segmentation:  Normal Alignment:  Normal Vertebrae:  Normal Conus medullaris: Extends to the L1 level and appears normal. Paraspinal and other soft tissues: Normal Disc levels: Normal. No degenerative disc disease. No facet arthropathy. No bone, joint or soft tissue edema. IMPRESSION: Normal exam. No abnormality seen to explain pain. No sign of regional infection. Electronically Signed   By: Nelson Chimes M.D.   On: 01/10/2017 17:19   US Venous Img Lower Bilateral  Result Date: 01/19/2017 CLINICAL DATA:  Swollen legs for 3 days EXAM: BILATERAL LOWER EXTREMITY VENOUS DOPPLER ULTRASOUND TECHNIQUE: Gray-scale sonography with graded compression, as well as color Doppler and duplex ultrasound were performed to evaluate the lower extremity deep venous systems from the level of the common femoral vein and including the common femoral, femoral, profunda femoral, popliteal and calf veins including the posterior tibial, peroneal and gastrocnemius veins when visible. The superficial great saphenous vein was also interrogated. Spectral Doppler was utilized to evaluate flow at rest and with distal augmentation maneuvers in the common femoral, femoral and popliteal veins. COMPARISON:  None. FINDINGS: RIGHT LOWER EXTREMITY Common Femoral Vein: No evidence of thrombus. Normal compressibility, respiratory phasicity and response to augmentation. Saphenofemoral Junction: No evidence of thrombus. Normal compressibility and  flow on color Doppler imaging. Profunda Femoral Vein: No evidence of thrombus. Normal compressibility and flow on color Doppler imaging. Femoral Vein: No evidence of thrombus. Normal compressibility, respiratory phasicity and response to augmentation. Popliteal Vein: No evidence of thrombus. Normal compressibility, respiratory phasicity and response to augmentation. Calf Veins: No evidence of thrombus. Normal compressibility and flow on color Doppler imaging. Superficial Great Saphenous Vein: No evidence of thrombus. Normal compressibility and flow on color Doppler imaging. Venous Reflux:  None. Other Findings:  None. LEFT LOWER EXTREMITY Common Femoral Vein: No evidence of thrombus. Normal compressibility,  respiratory phasicity and response to augmentation. Saphenofemoral Junction: No evidence of thrombus. Normal compressibility and flow on color Doppler imaging. Profunda Femoral Vein: No evidence of thrombus. Normal compressibility and flow on color Doppler imaging. Femoral Vein: No evidence of thrombus. Normal compressibility, respiratory phasicity and response to augmentation. Popliteal Vein: No evidence of thrombus. Normal compressibility, respiratory phasicity and response to augmentation. Calf Veins: No evidence of thrombus. Normal compressibility and flow on color Doppler imaging. Superficial Great Saphenous Vein: No evidence of thrombus. Normal compressibility and flow on color Doppler imaging. Venous Reflux:  None. Other Findings:  None. IMPRESSION: No evidence of deep venous thrombosis of bilateral lower extremities. Electronically Signed   By: Kathreen Devoid   On: 01/19/2017 11:14   Dg Chest Port 1 View  Result Date: 01/08/2017 CLINICAL DATA:  Fever.  Short of breath.  Sickle cell crisis. EXAM: PORTABLE CHEST 1 VIEW COMPARISON:  01/04/2017 FINDINGS: Right internal jugular power port is unchanged with tip in the SVC 2 cm above the right atrium. Chronic mild cardiomegaly. Chronic pulmonary vascular  plethora. No evidence of infiltrate, effusion or collapse. Chronic AVN humeral heads. IMPRESSION: Chronic cardiomegaly and pulmonary vascular plethora. No acute or focal findings otherwise. Electronically Signed   By: Nelson Chimes M.D.   On: 01/08/2017 15:28      Subjective: Patient says she feels well.  She is excited to get out of the hospital. No new concerns voiced.  Patient understands antibiotic administration and understands she needs to follow up with your primary care outpatient.  Discharge Exam: Vitals:   01/24/17 1400 01/25/17 0519  BP: 127/81 (!) 145/85  Pulse: (!) 102 94  Resp: 17 18  Temp: 98.6 F (37 C) 98.8 F (37.1 C)   Vitals:   01/24/17 0500 01/24/17 1400 01/25/17 0519 01/25/17 0843  BP: 125/69 127/81 (!) 145/85   Pulse: 97 (!) 102 94   Resp: 18 17 18    Temp: 98.6 F (37 C) 98.6 F (37 C) 98.8 F (37.1 C)   TempSrc: Oral Oral Oral   SpO2: 95% 97% 98%   Weight: 78 kg (171 lb 14.4 oz)   78.2 kg (172 lb 8 oz)  Height:        General: Pt is alert, awake, not in acute distress Cardiovascular: RRR, S1/S2 +, no rubs, no gallops Respiratory: CTA bilaterally, no wheezing, no rhonchi Abdominal: Soft, NT, ND, bowel sounds + Extremities: minimal edema, no cyanosis    The results of significant diagnostics from this hospitalization (including imaging, microbiology, ancillary and laboratory) are listed below for reference.     Microbiology: No results found for this or any previous visit (from the past 240 hour(s)).   Labs: BNP (last 3 results) No results for input(s): BNP in the last 8760 hours. Basic Metabolic Panel:  Recent Labs Lab 01/19/17 1155 01/20/17 0629 01/21/17 0601 01/22/17 0507 01/23/17 0607 01/24/17 1039 01/25/17 0604  NA 140 137 137 135 134* 135 133*  K 4.0 2.8* 3.4* 3.4* 3.4* 2.9* 3.3*  CL 102 100* 103 101 102 103 103  CO2 31 31 29 28 27 26 25   GLUCOSE 93 91 122* 79 83 100* 104*  BUN 6 8 7 7 7 7 8   CREATININE 0.40* 0.63 0.63 0.59  0.59 0.67 0.53  CALCIUM 8.1* 7.7* 7.8* 7.9* 8.0* 7.8* 8.0*  MG 1.8 1.7 1.7 1.8 1.7  --   --   PHOS 2.9 3.9 3.0 3.3 3.3  --   --    Liver Function  Tests:  Recent Labs Lab 01/19/17 1155 01/20/17 0629 01/21/17 0601 01/22/17 0507 01/23/17 0607  AST 135* 96* 98* 99* 99*  ALT 16 12* 11* 12* 12*  ALKPHOS 139* 128* 126 134* 133*  BILITOT 12.2* 10.4* 9.5* 9.1* 8.8*  PROT 7.0 6.2* 6.3* 6.4* 6.4*  ALBUMIN 2.3* 2.0* 2.0* 2.0* 1.9*   No results for input(s): LIPASE, AMYLASE in the last 168 hours. No results for input(s): AMMONIA in the last 168 hours. CBC:  Recent Labs Lab 01/20/17 0629 01/21/17 0601 01/22/17 0507 01/23/17 0607 01/25/17 0604  WBC 11.1* 11.0* 9.3 8.0 7.3  NEUTROABS 7.5 8.1* 5.2 4.4 4.0  HGB 6.2* 6.4* 6.1* 6.4* 6.6*  HCT 18.1* 18.2* 17.6* 18.1* 18.6*  MCV 97.3 97.8 97.2 97.8 96.9  PLT 491* 464* 439* 443* 378   Cardiac Enzymes: No results for input(s): CKTOTAL, CKMB, CKMBINDEX, TROPONINI in the last 168 hours. BNP: Invalid input(s): POCBNP CBG: No results for input(s): GLUCAP in the last 168 hours. D-Dimer No results for input(s): DDIMER in the last 72 hours. Hgb A1c No results for input(s): HGBA1C in the last 72 hours. Lipid Profile No results for input(s): CHOL, HDL, LDLCALC, TRIG, CHOLHDL, LDLDIRECT in the last 72 hours. Thyroid function studies No results for input(s): TSH, T4TOTAL, T3FREE, THYROIDAB in the last 72 hours.  Invalid input(s): FREET3 Anemia work up  Recent Labs  01/23/17 0607  RETICCTPCT 18.5*   Urinalysis    Component Value Date/Time   COLORURINE YELLOW 01/09/2017 0001   APPEARANCEUR CLEAR 01/09/2017 0001   LABSPEC 1.004 (L) 01/09/2017 0001   PHURINE 5.0 01/09/2017 0001   GLUCOSEU NEGATIVE 01/09/2017 0001   HGBUR MODERATE (A) 01/09/2017 0001   BILIRUBINUR NEGATIVE 01/09/2017 0001   KETONESUR NEGATIVE 01/09/2017 0001   PROTEINUR NEGATIVE 01/09/2017 0001   UROBILINOGEN >=8.0 09/07/2016 1004   NITRITE NEGATIVE 01/09/2017 0001    LEUKOCYTESUR NEGATIVE 01/09/2017 0001   Sepsis Labs Invalid input(s): PROCALCITONIN,  WBC,  LACTICIDVEN Microbiology No results found for this or any previous visit (from the past 240 hour(s)).   Time coordinating discharge: 45 minutes  SIGNED:   Loretha Stapler, MD  [facility] 01/25/2017, 12:47 PM Pager [phone] If 7PM-7AM, please contact night-coverage [email or web address] Password [email or web address]

## 2017-01-25 NOTE — Care Management Note (Signed)
Case Management Note  Patient Details  Name: Dawn [name] MRN: [id number] Date of Birth: 10-Oct-1980    Expected Discharge Date:  01/25/17               Expected Discharge Plan:  Home/Self Care  In-House Referral:     Discharge planning Services  CM Consult  Post Acute Care Choice:  Home Health Choice offered to:     DME Arranged:    DME Agency:     HH Arranged:  RN [place] Agency:  [place] [facility]  Status of Service:  Completed, signed off  If discussed at [place] of Stay Meetings, dates discussed:    Additional Comments: Patient discharging home today. Will receive IV abx from [facility]. No other CM needs.   Pheobe Sandiford, Chauncey Reading, RN 01/25/2017, 12:49 PM

## 2017-01-25 NOTE — Progress Notes (Signed)
Discharge instructions gone over with patient, verbalized understanding. Patient is going home with single lumen PICC line to receive IV ABT. PICC line has been de-accessed and flushed, flushes well and patient reports no pain or discomfort during flushing.

## 2017-01-29 DIAGNOSIS — I469 Cardiac arrest, cause unspecified: Secondary | ICD-10-CM | POA: Diagnosis not present

## 2017-01-31 ENCOUNTER — Ambulatory Visit: Payer: Medicare Other | Admitting: Family Medicine

## 2017-02-22 DIAGNOSIS — 419620001 Death: Secondary | SNOMED CT | POA: Diagnosis not present

## 2017-02-22 DEATH — deceased

## 2017-07-15 IMAGING — DX DG CHEST 2V
2 series · 2 of 2 positions shown · non-contrast
Comparison: Chest radiograph October 02, 2016 and chest CT October 03, 2016

CLINICAL DATA: Chest pain.  Sickle cell disease.

EXAM:
CHEST  2 VIEW

[chest pa]
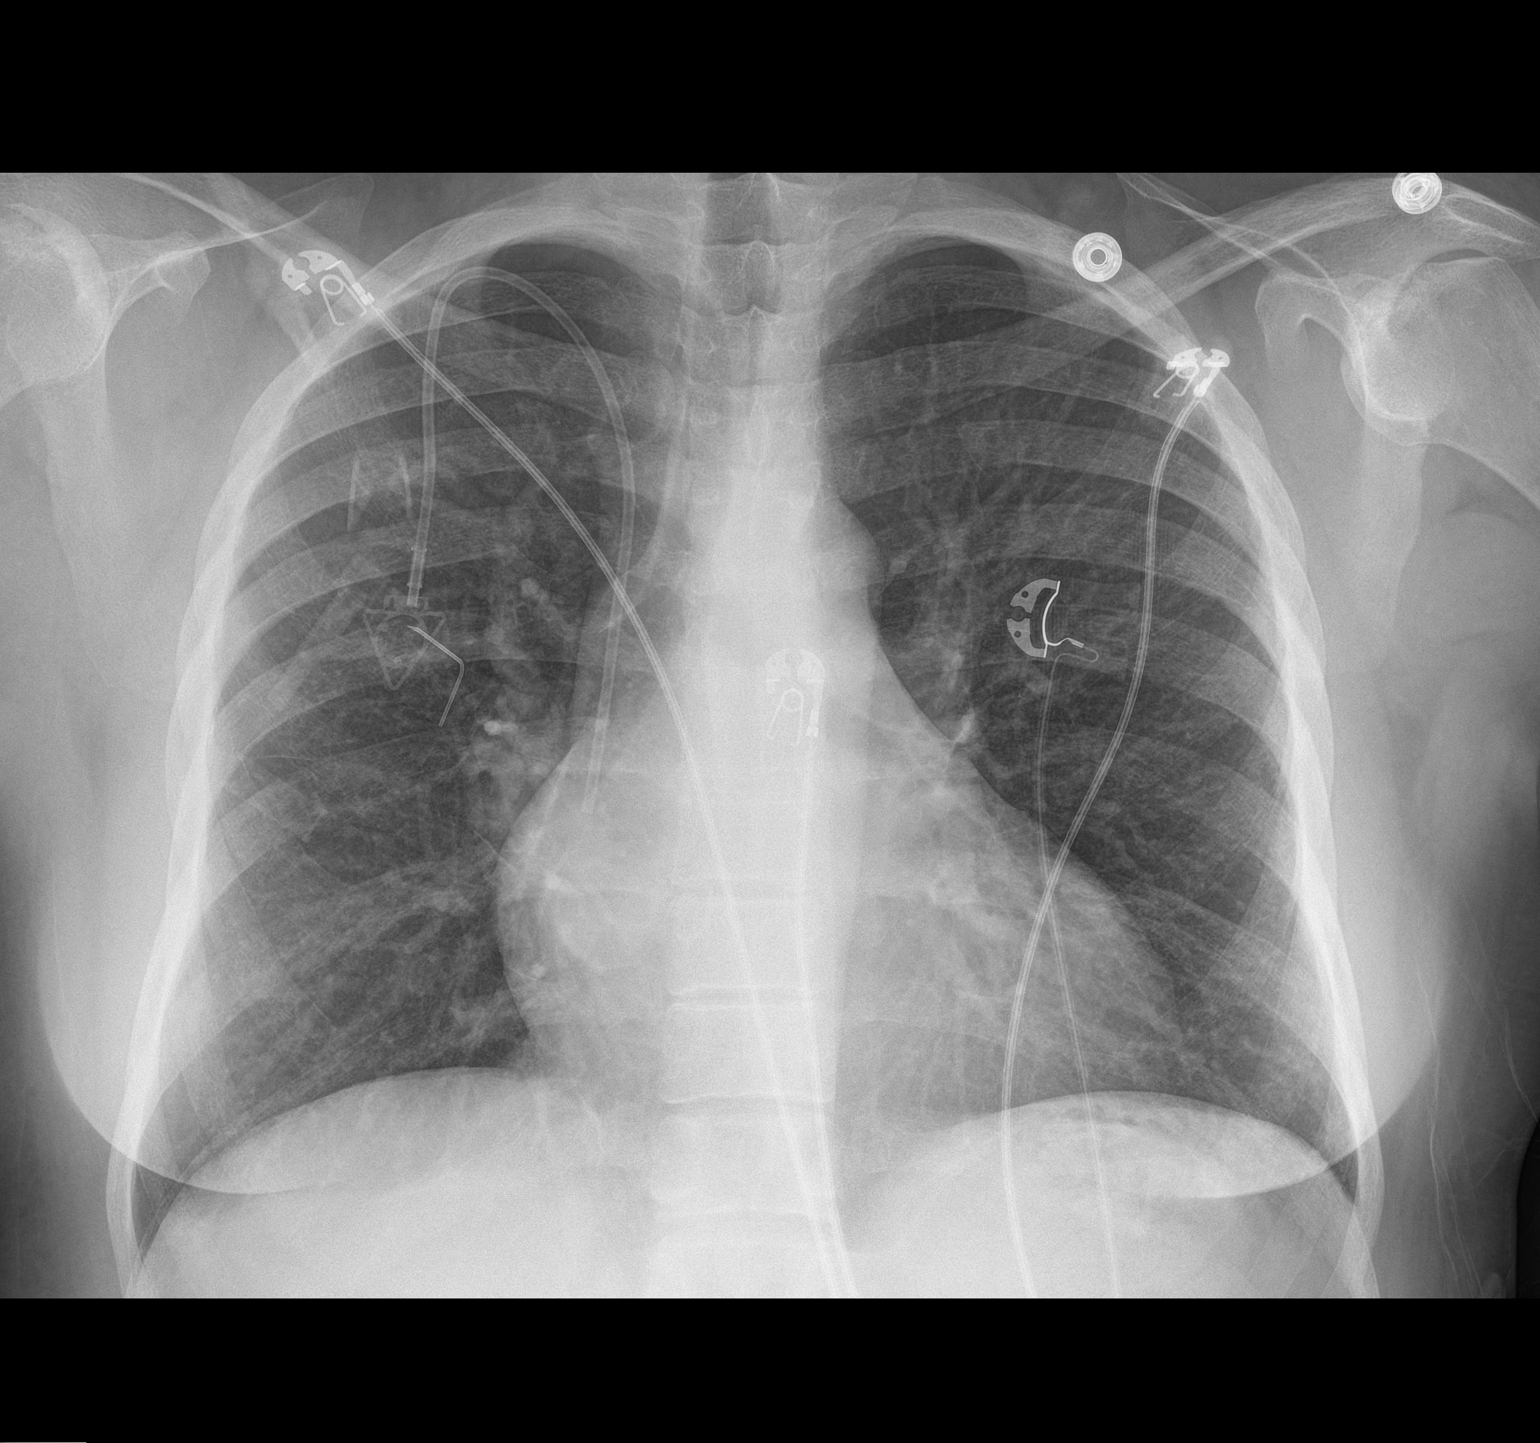

[chest lat]
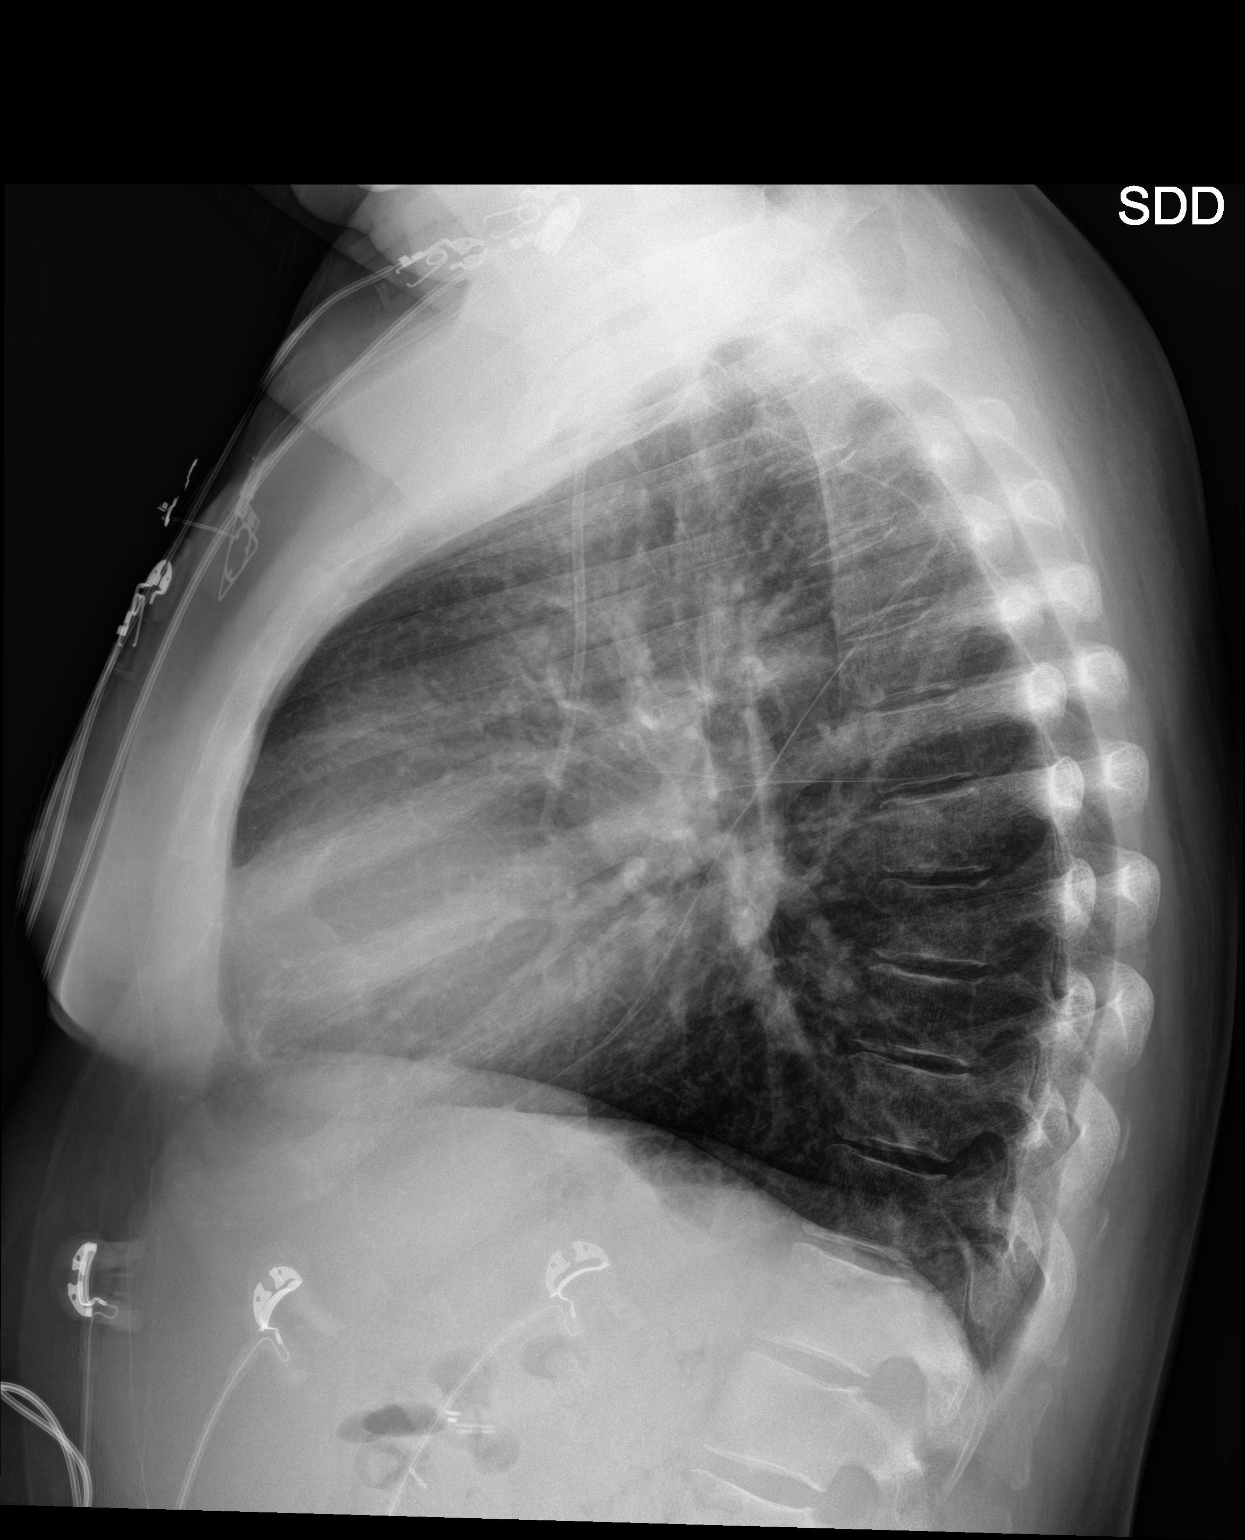

[2 of 2 positions shown; findings below may reference images not displayed]

FINDINGS: No edema or consolidation. Heart is mildly enlarged with pulmonary
venous hypertension. No adenopathy evident. Port-A-Cath tip is in
the superior vena cava. No pneumothorax. No bone lesions evident.
IMPRESSION: Evidence of pulmonary vascular congestion without edema or
consolidation. No evident adenopathy. Port-A-Cath tip in superior
vena cava.

## 2017-09-07 IMAGING — DX DG CHEST 2V
2 series · 2 of 2 positions shown · non-contrast
Comparison: 12/09/2016

CLINICAL DATA: Sickle cell disease, shortness of breath

EXAM:
CHEST  2 VIEW

[chest pa]
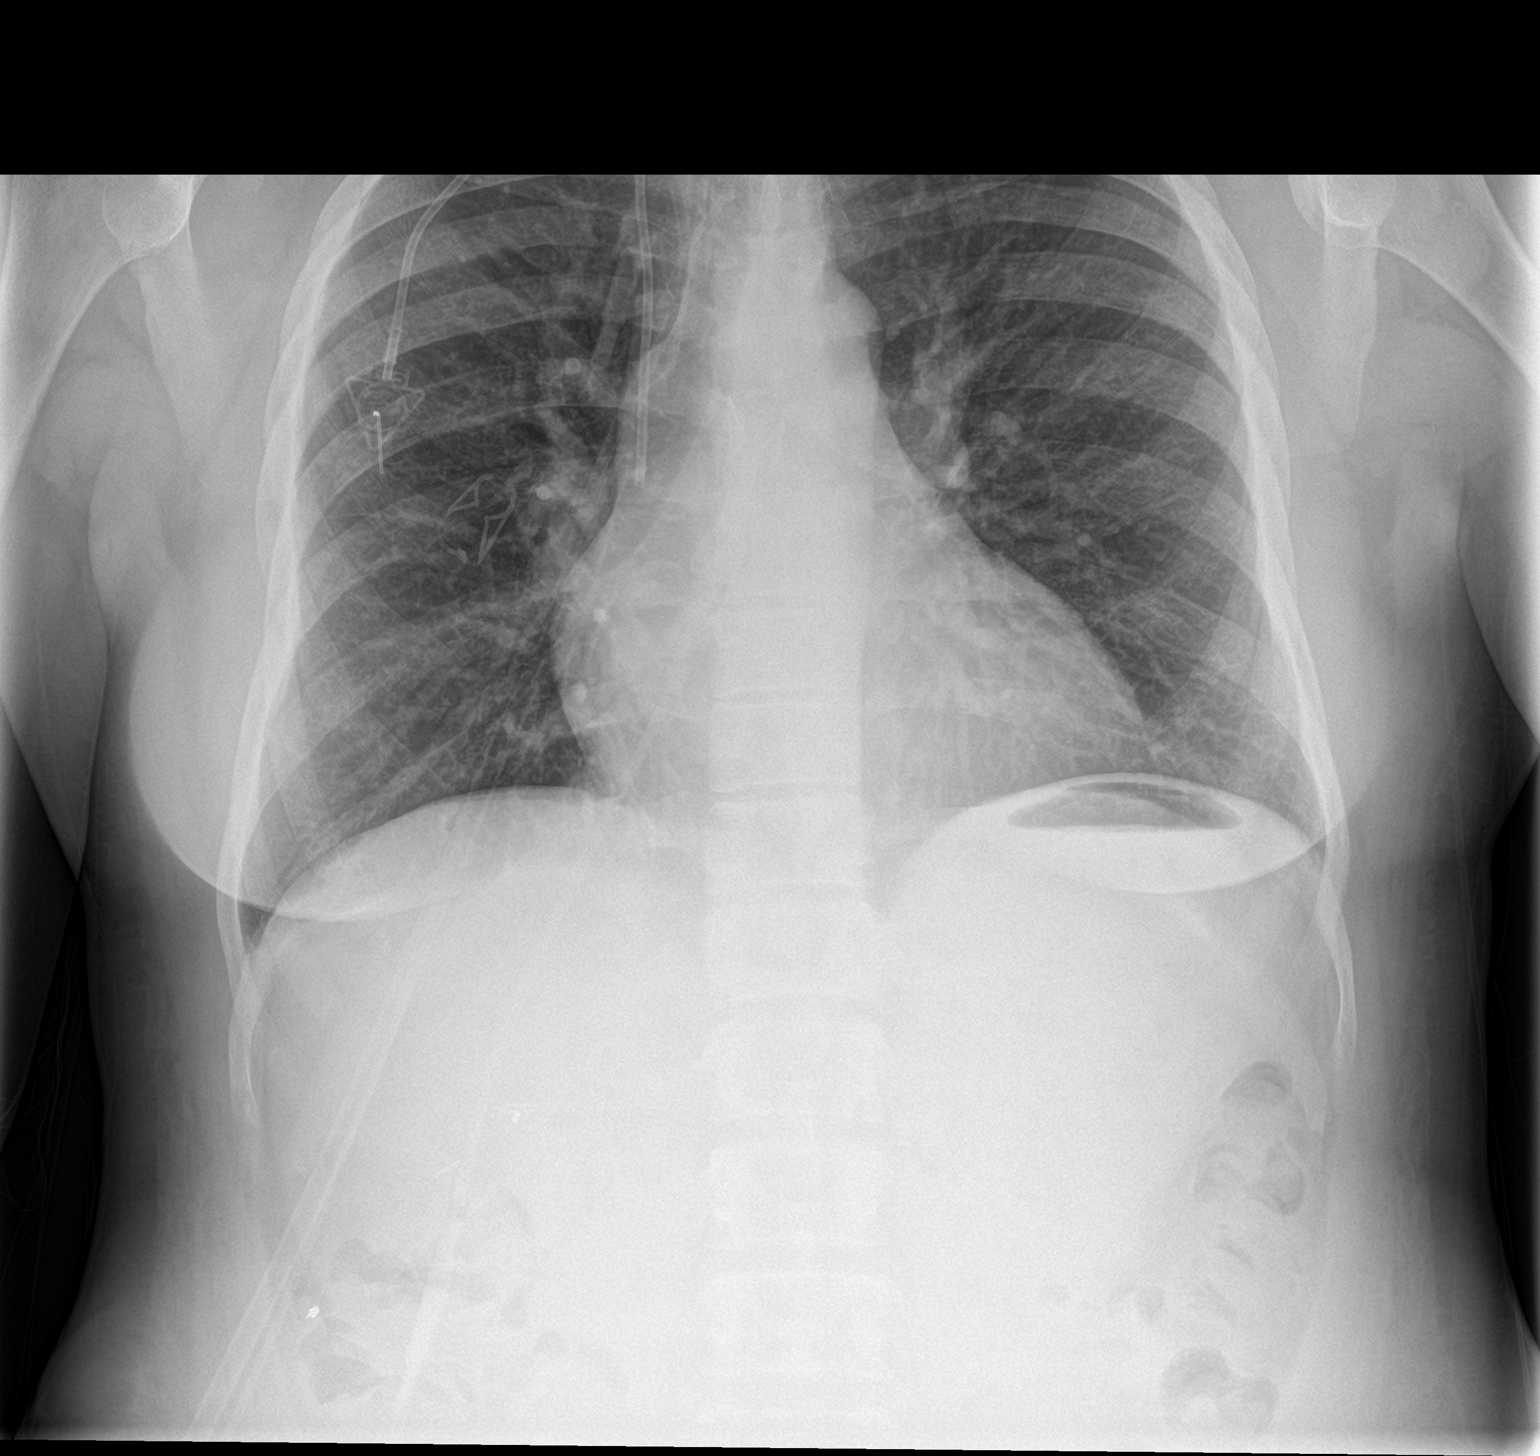

[chest lat]
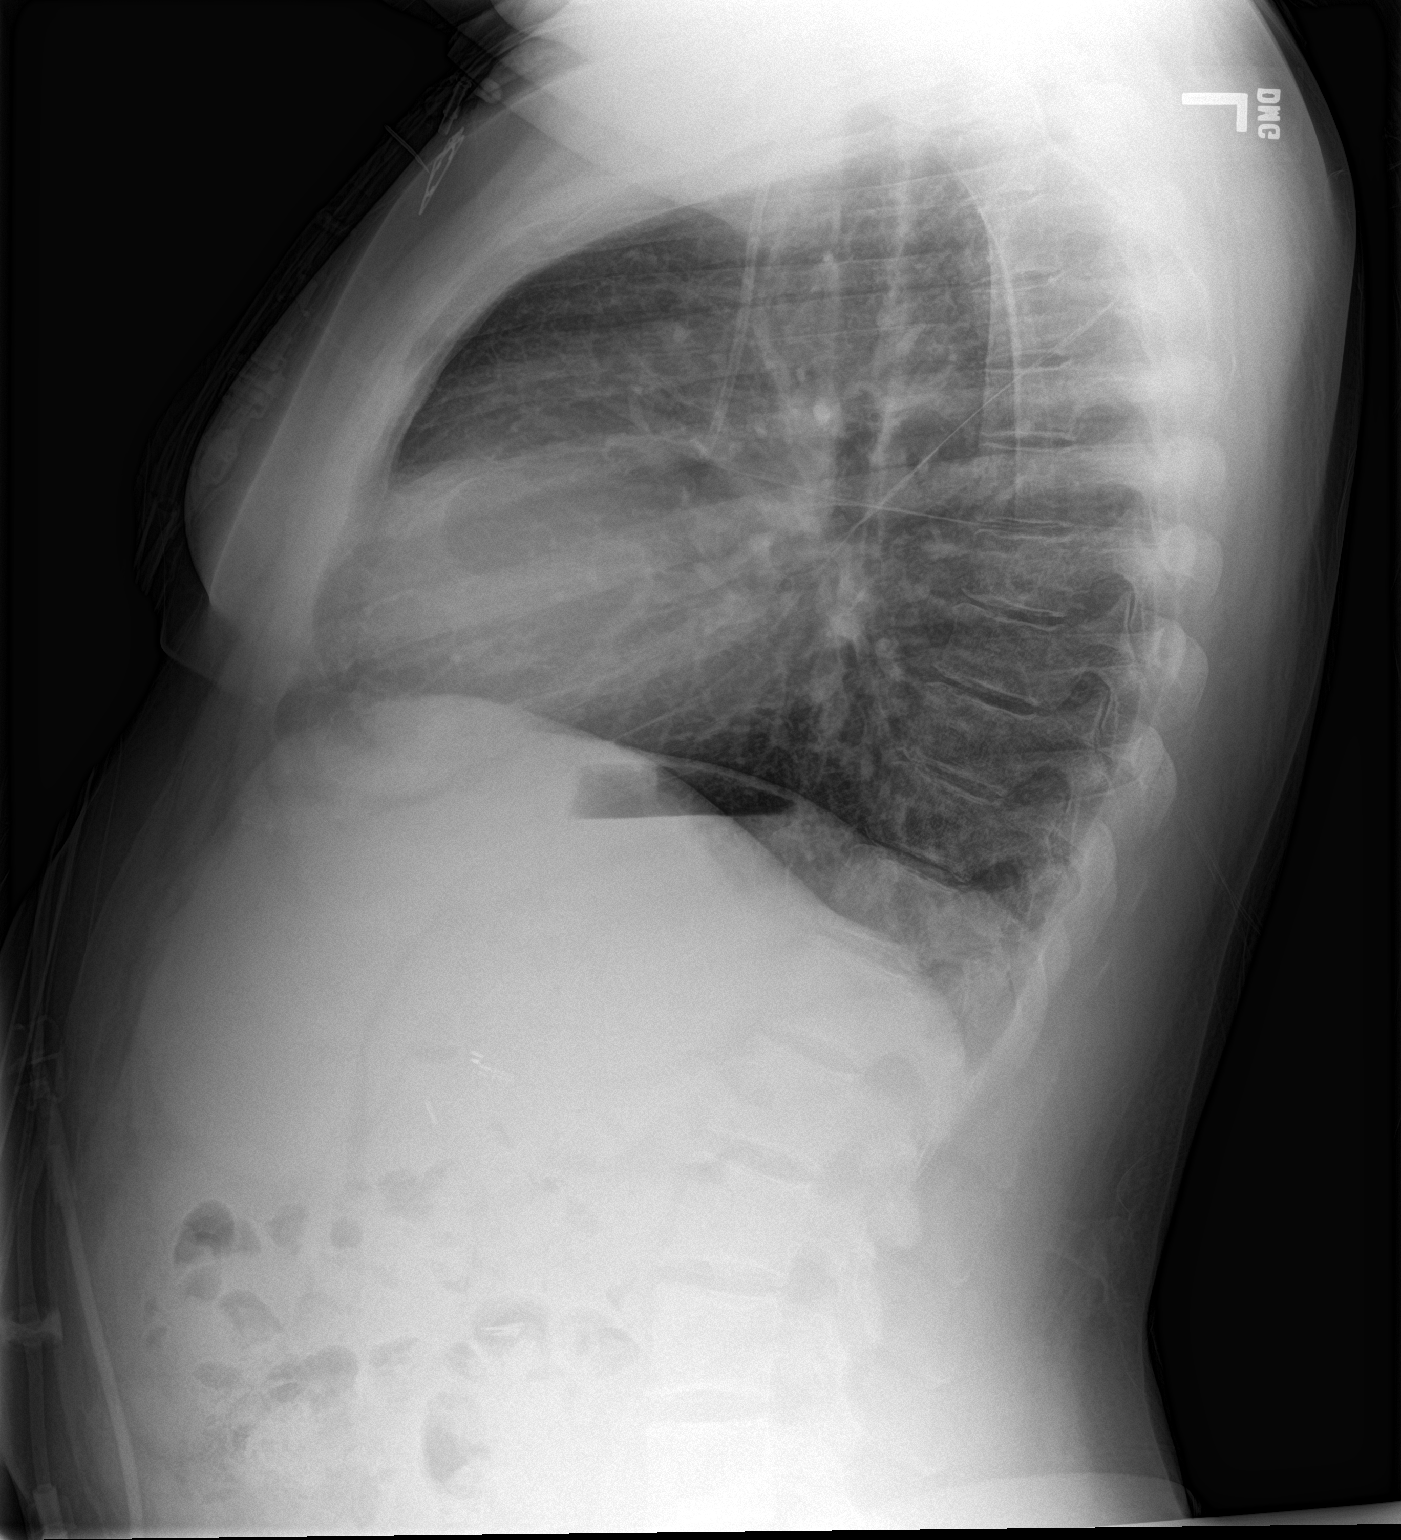

[2 of 2 positions shown; findings below may reference images not displayed]

FINDINGS: There is a right-sided Port-A-Cath with the tip projecting over the
SVC. There is no focal parenchymal opacity. There is no pleural
effusion or pneumothorax. The heart and mediastinal contours are
unremarkable.

The osseous structures are unremarkable.
IMPRESSION: No active cardiopulmonary disease.

## 2017-09-11 IMAGING — CR DG CHEST 1V PORT
1 series · 1 of 1 positions shown · non-contrast
Comparison: 01/04/2017

CLINICAL DATA: Fever.  Short of breath.  Sickle cell crisis.

EXAM:
PORTABLE CHEST 1 VIEW

[portable]
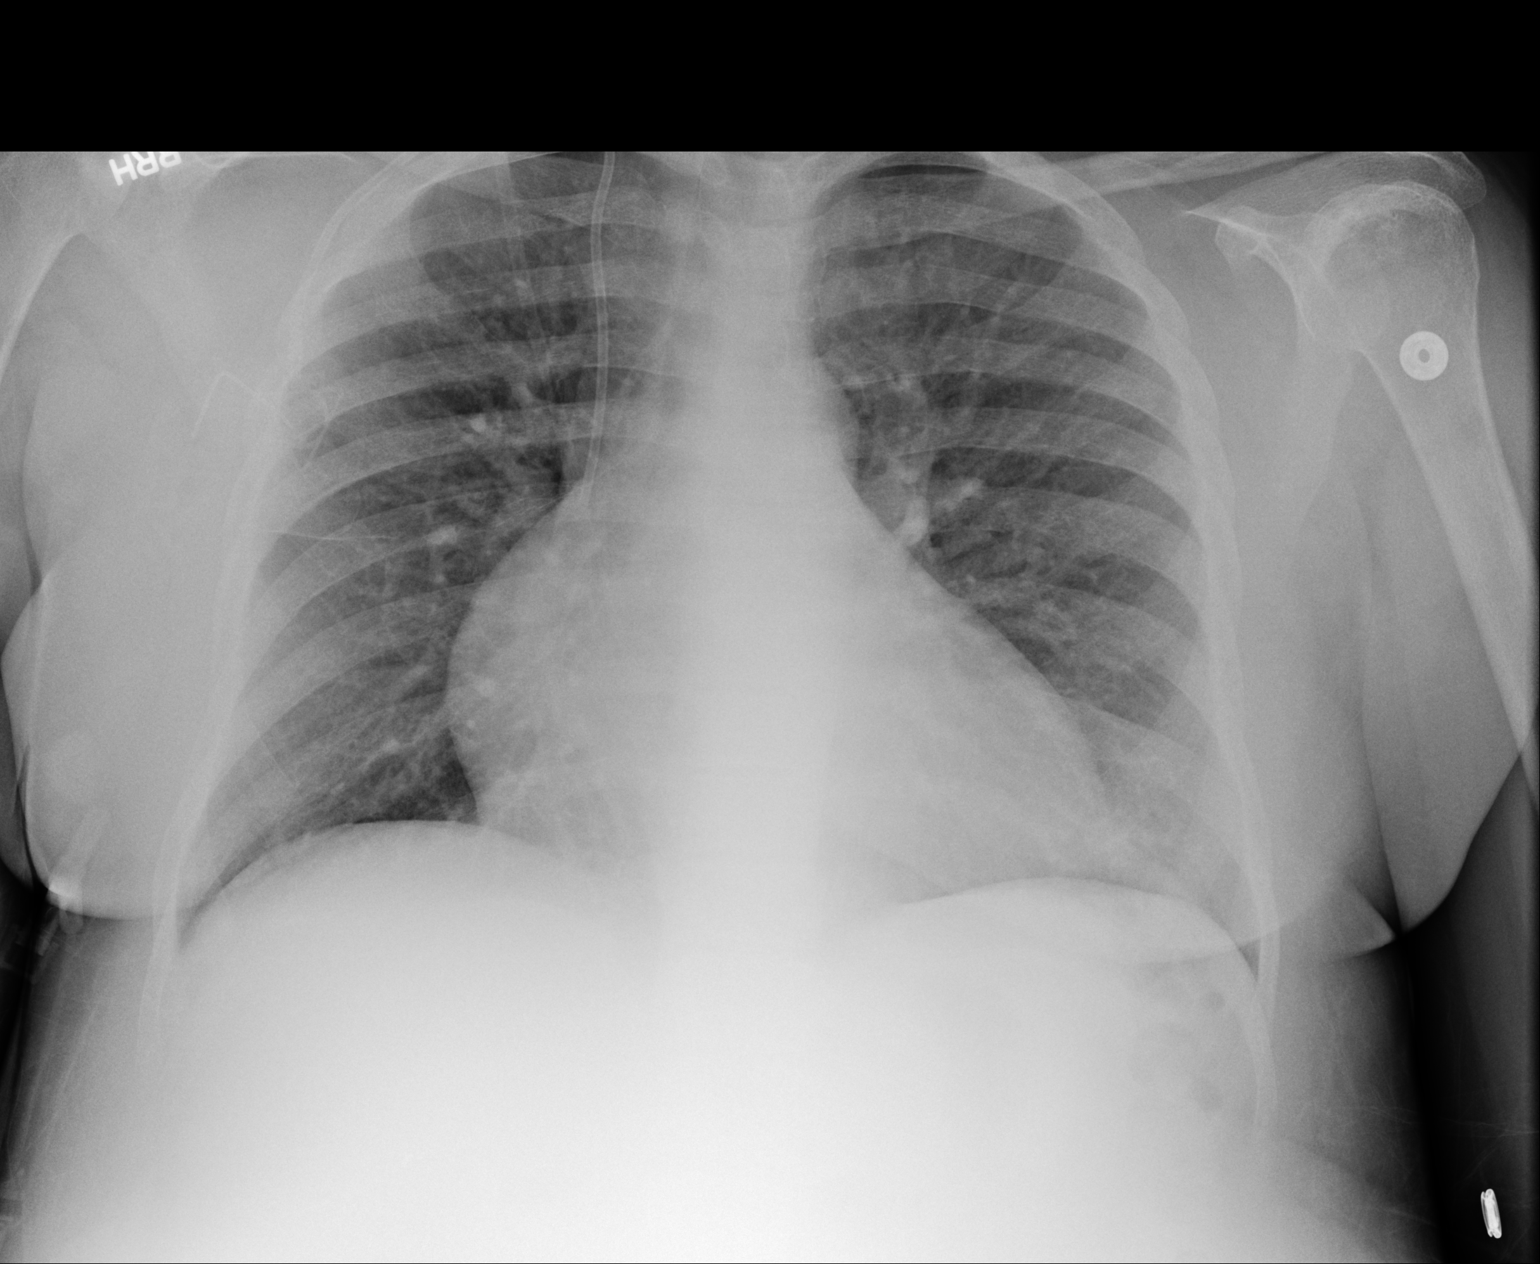

[1 of 1 positions shown; findings below may reference images not displayed]

FINDINGS: Right internal jugular power port is unchanged with tip in the SVC 2
cm above the right atrium. Chronic mild cardiomegaly. Chronic
pulmonary vascular plethora. No evidence of infiltrate, effusion or
collapse. Chronic AVN humeral heads.
IMPRESSION: Chronic cardiomegaly and pulmonary vascular plethora. No acute or
focal findings otherwise.

## 2018-12-10 IMAGING — US US EXTREM LOW VENOUS BILAT
1 series · 13 of 24 positions shown · non-contrast
Comparison: None.

CLINICAL DATA: Swollen legs for 3 days



[Series 1: us extrem low venous bilat · 0.05mm/px · 13 of 66 slices shown]
[im 1/66]
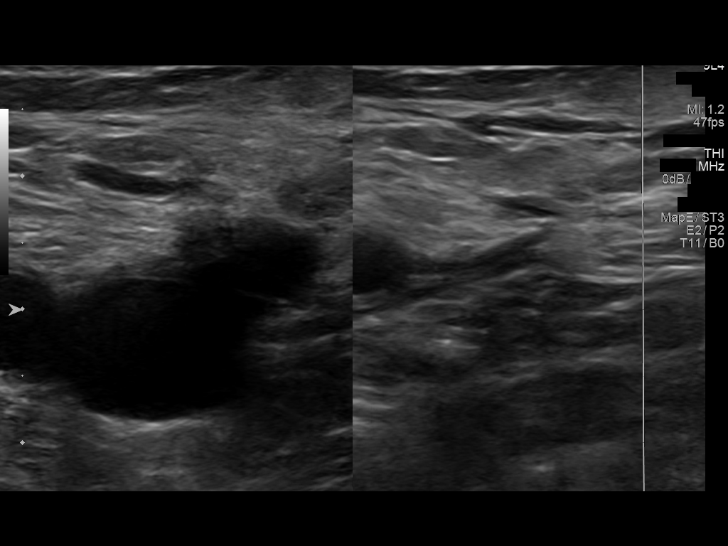
[im 6/66]
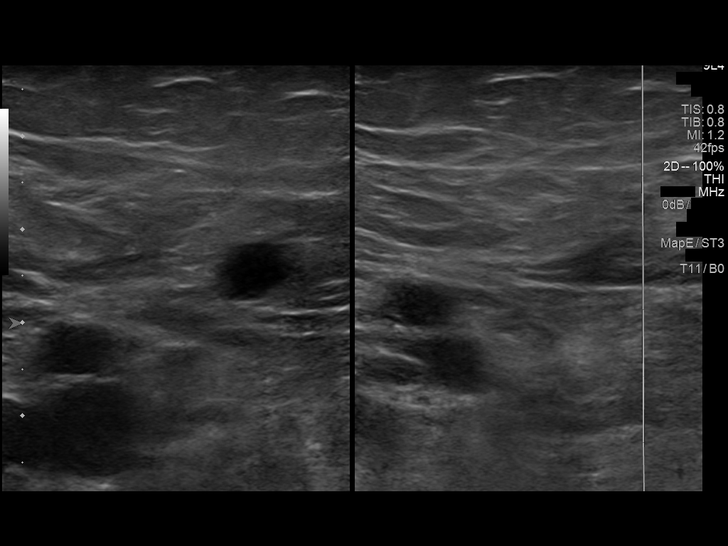
[im 12/66]
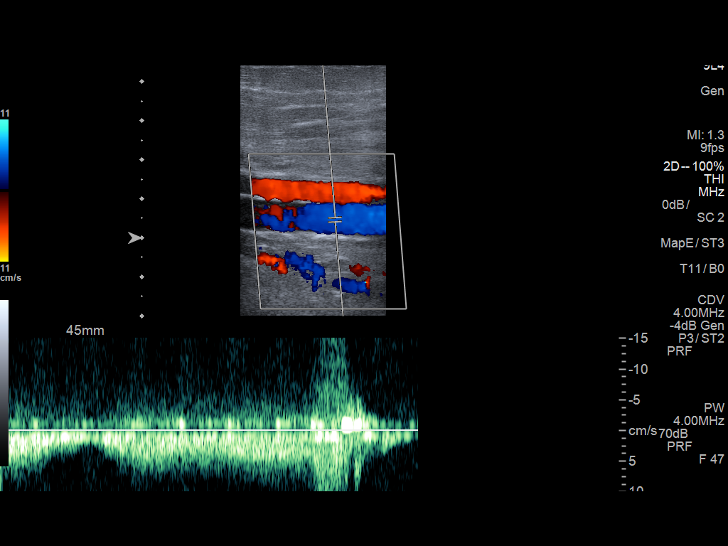
[im 17/66]
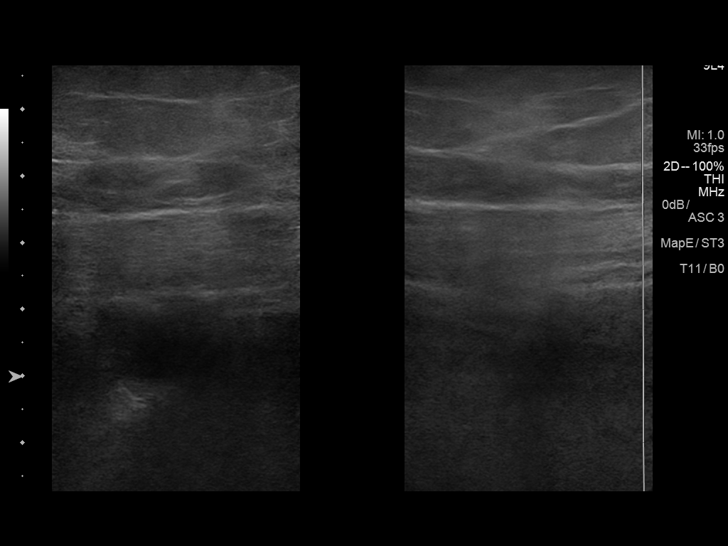
[im 23/66]
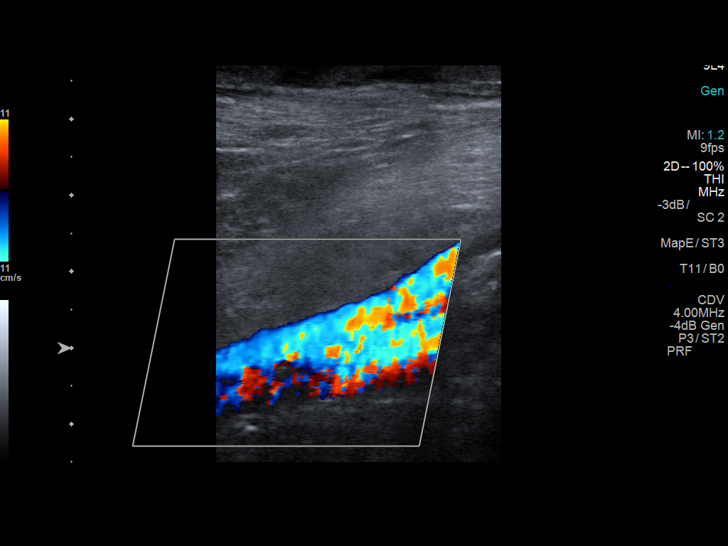
[im 29/66]
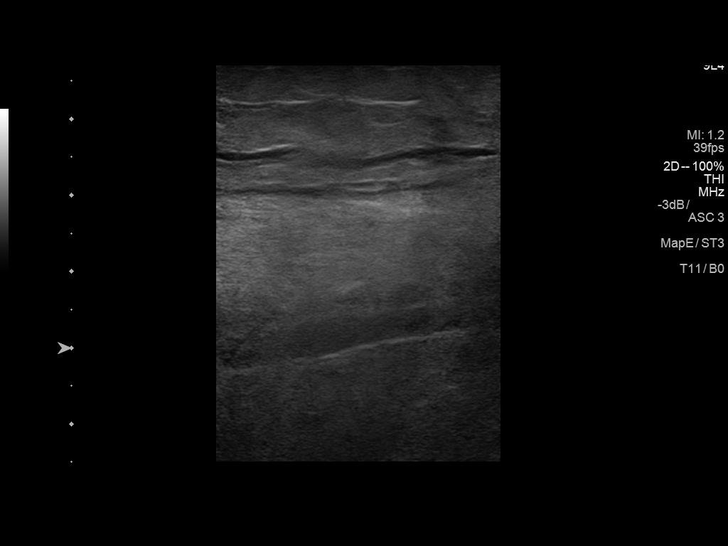
[im 34/66]
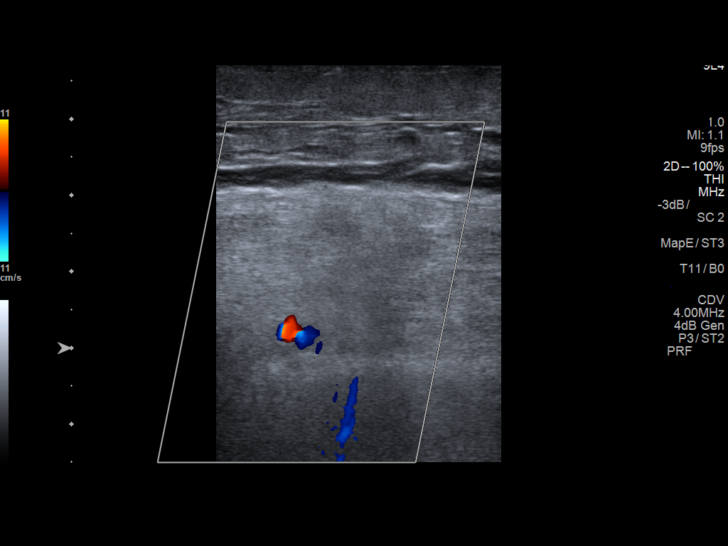
[im 37/66]
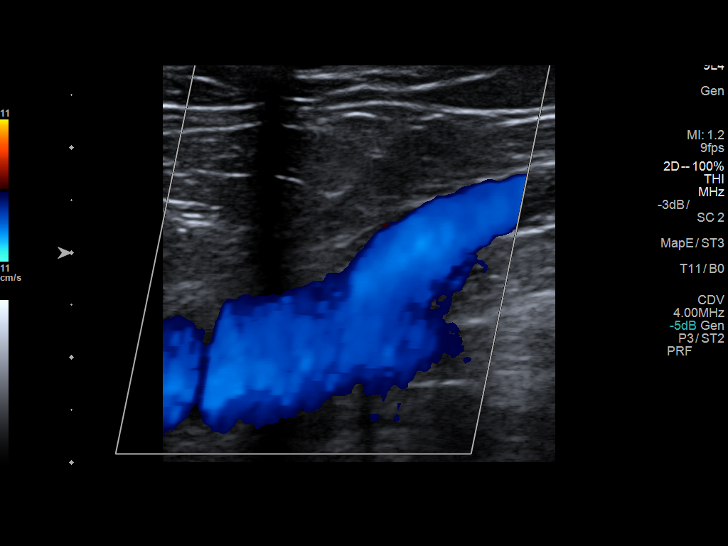
[im 43/66]
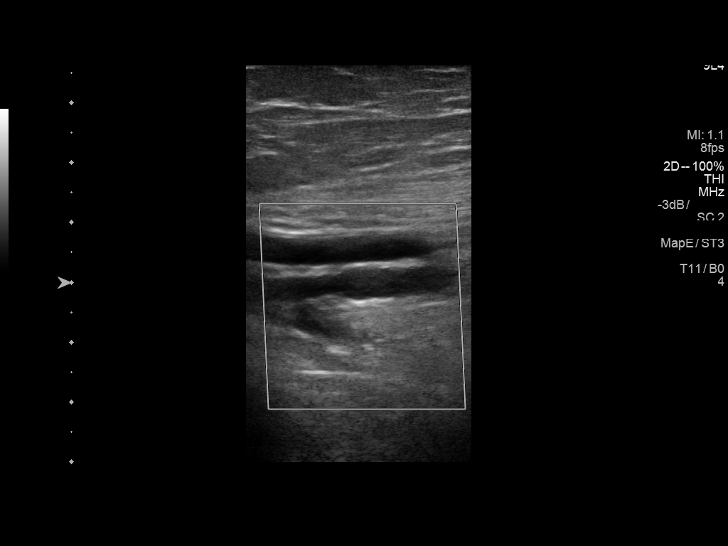
[im 49/66]
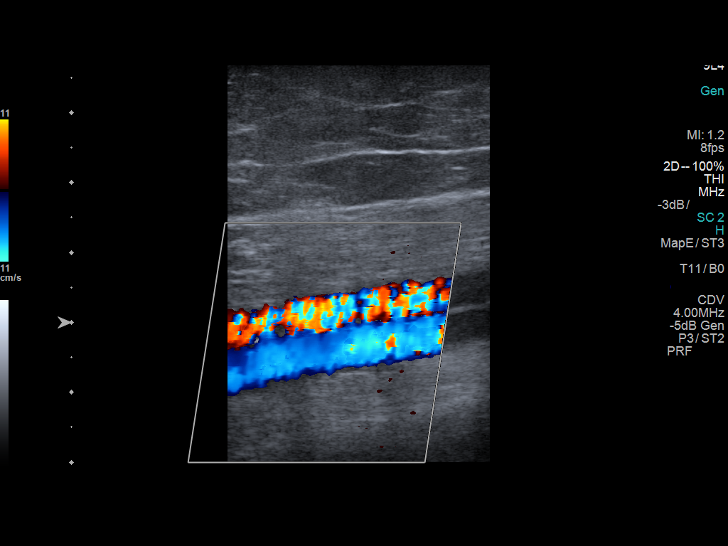
[im 54/66]
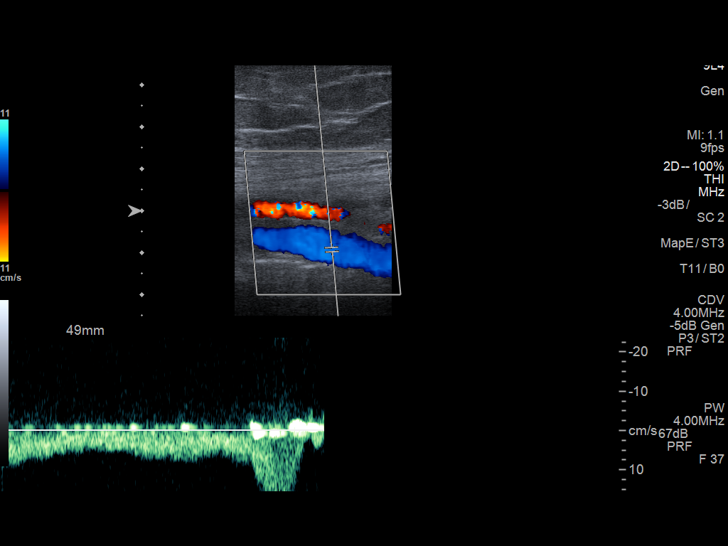
[im 60/66]
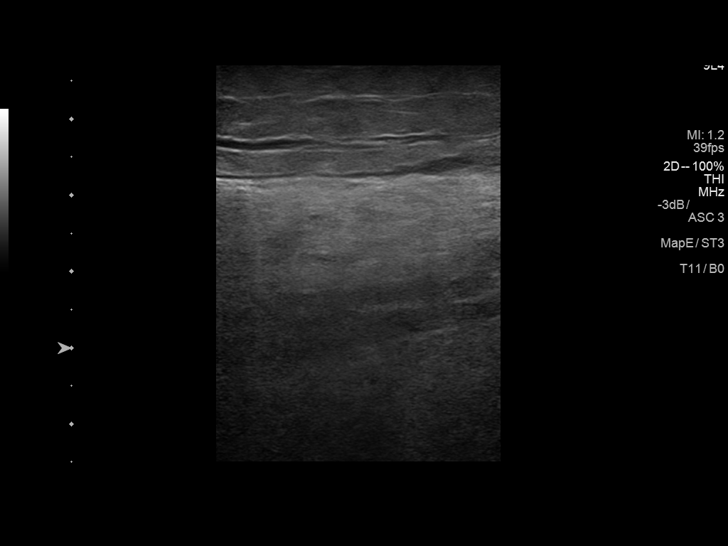
[im 66/66]
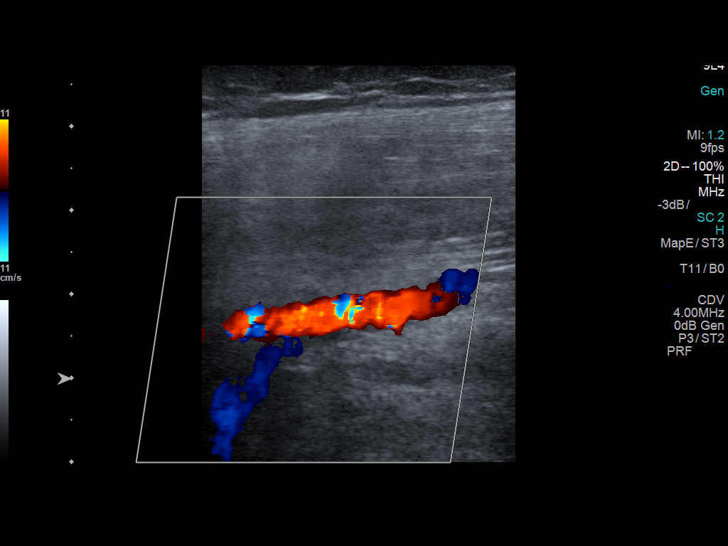

[13 of 24 positions shown; findings below may reference images not displayed]

FINDINGS: RIGHT LOWER EXTREMITY

Common Femoral Vein: No evidence of thrombus. Normal
compressibility, respiratory phasicity and response to augmentation.

Saphenofemoral Junction: No evidence of thrombus. Normal
compressibility and flow on color Doppler imaging.

Profunda Femoral Vein: No evidence of thrombus. Normal
compressibility and flow on color Doppler imaging.

Femoral Vein: No evidence of thrombus. Normal compressibility,
respiratory phasicity and response to augmentation.

Popliteal Vein: No evidence of thrombus. Normal compressibility,
respiratory phasicity and response to augmentation.

Calf Veins: No evidence of thrombus. Normal compressibility and flow
on color Doppler imaging.

Superficial Great Saphenous Vein: No evidence of thrombus. Normal
compressibility and flow on color Doppler imaging.

Venous Reflux:  None.

Other Findings:  None.

LEFT LOWER EXTREMITY

Common Femoral Vein: No evidence of thrombus. Normal
compressibility, respiratory phasicity and response to augmentation.

Saphenofemoral Junction: No evidence of thrombus. Normal
compressibility and flow on color Doppler imaging.

Profunda Femoral Vein: No evidence of thrombus. Normal
compressibility and flow on color Doppler imaging.

Femoral Vein: No evidence of thrombus. Normal compressibility,
respiratory phasicity and response to augmentation.

Popliteal Vein: No evidence of thrombus. Normal compressibility,
respiratory phasicity and response to augmentation.

Calf Veins: No evidence of thrombus. Normal compressibility and flow
on color Doppler imaging.

Superficial Great Saphenous Vein: No evidence of thrombus. Normal
compressibility and flow on color Doppler imaging.

Venous Reflux:  None.

Other Findings:  None.
IMPRESSION: No evidence of deep venous thrombosis of bilateral lower
extremities.

## 2019-01-20 NOTE — Telephone Encounter (Signed)
Message sent to provider 

## 2019-01-23 NOTE — Telephone Encounter (Signed)
Message sent to provider
